# Patient Record
Sex: Female | Born: 1965 | Race: Black or African American | Hispanic: No | Marital: Married | Smoking: Former smoker
Health system: Southern US, Community
[De-identification: ages and names within clinical notes are randomized; demographics above are authoritative.]

## PROBLEM LIST (undated history)

## (undated) ENCOUNTER — Emergency Department (HOSPITAL_COMMUNITY): Admission: EM | Payer: BLUE CROSS/BLUE SHIELD | Source: Home / Self Care

## (undated) DIAGNOSIS — Z87442 Personal history of urinary calculi: Secondary | ICD-10-CM

## (undated) DIAGNOSIS — E785 Hyperlipidemia, unspecified: Secondary | ICD-10-CM

## (undated) DIAGNOSIS — J189 Pneumonia, unspecified organism: Secondary | ICD-10-CM

## (undated) DIAGNOSIS — T7840XA Allergy, unspecified, initial encounter: Secondary | ICD-10-CM

## (undated) DIAGNOSIS — J849 Interstitial pulmonary disease, unspecified: Secondary | ICD-10-CM

## (undated) DIAGNOSIS — J45909 Unspecified asthma, uncomplicated: Secondary | ICD-10-CM

## (undated) DIAGNOSIS — I5030 Unspecified diastolic (congestive) heart failure: Secondary | ICD-10-CM

## (undated) DIAGNOSIS — D649 Anemia, unspecified: Secondary | ICD-10-CM

## (undated) DIAGNOSIS — I499 Cardiac arrhythmia, unspecified: Secondary | ICD-10-CM

## (undated) DIAGNOSIS — F419 Anxiety disorder, unspecified: Secondary | ICD-10-CM

## (undated) DIAGNOSIS — F17210 Nicotine dependence, cigarettes, uncomplicated: Secondary | ICD-10-CM

## (undated) DIAGNOSIS — I050 Rheumatic mitral stenosis: Secondary | ICD-10-CM

## (undated) DIAGNOSIS — K219 Gastro-esophageal reflux disease without esophagitis: Secondary | ICD-10-CM

## (undated) DIAGNOSIS — I480 Paroxysmal atrial fibrillation: Secondary | ICD-10-CM

## (undated) DIAGNOSIS — R0789 Other chest pain: Secondary | ICD-10-CM

## (undated) DIAGNOSIS — I10 Essential (primary) hypertension: Secondary | ICD-10-CM

## (undated) DIAGNOSIS — J40 Bronchitis, not specified as acute or chronic: Secondary | ICD-10-CM

## (undated) DIAGNOSIS — I509 Heart failure, unspecified: Secondary | ICD-10-CM

## (undated) DIAGNOSIS — R0600 Dyspnea, unspecified: Secondary | ICD-10-CM

## (undated) DIAGNOSIS — I4819 Other persistent atrial fibrillation: Secondary | ICD-10-CM

## (undated) DIAGNOSIS — R011 Cardiac murmur, unspecified: Secondary | ICD-10-CM

## (undated) DIAGNOSIS — R002 Palpitations: Secondary | ICD-10-CM

## (undated) DIAGNOSIS — Z1371 Encounter for nonprocreative screening for genetic disease carrier status: Secondary | ICD-10-CM

## (undated) HISTORY — PX: JOINT REPLACEMENT: SHX530

## (undated) HISTORY — PX: CARDIAC CATHETERIZATION: SHX172

## (undated) HISTORY — DX: Other chest pain: R07.89

## (undated) HISTORY — DX: Other persistent atrial fibrillation: I48.19

## (undated) HISTORY — DX: Unspecified diastolic (congestive) heart failure: I50.30

## (undated) HISTORY — DX: Interstitial pulmonary disease, unspecified: J84.9

## (undated) HISTORY — DX: Paroxysmal atrial fibrillation: I48.0

## (undated) HISTORY — PX: ABDOMINAL HYSTERECTOMY: SUR658

## (undated) HISTORY — PX: TUBAL LIGATION: SHX77

## (undated) HISTORY — DX: Hyperlipidemia, unspecified: E78.5

## (undated) HISTORY — PX: DIAGNOSTIC LAPAROSCOPY: SUR761

## (undated) HISTORY — PX: KNEE SURGERY: SHX244

## (undated) HISTORY — DX: Allergy, unspecified, initial encounter: T78.40XA

## (undated) HISTORY — DX: Essential (primary) hypertension: I10

## (undated) HISTORY — DX: Rheumatic mitral stenosis: I05.0

## (undated) HISTORY — DX: Encounter for nonprocreative screening for genetic disease carrier status: Z13.71

## (undated) HISTORY — DX: Palpitations: R00.2

## (undated) HISTORY — PX: ABDOMINAL HYSTERECTOMY: SHX81

---

## 2004-09-14 ENCOUNTER — Inpatient Hospital Stay: Payer: Self-pay | Admitting: Obstetrics and Gynecology

## 2005-08-04 ENCOUNTER — Emergency Department: Payer: Self-pay | Admitting: Internal Medicine

## 2008-02-22 ENCOUNTER — Ambulatory Visit: Payer: Self-pay | Admitting: Internal Medicine

## 2008-03-07 ENCOUNTER — Ambulatory Visit: Payer: Self-pay | Admitting: Internal Medicine

## 2008-09-19 ENCOUNTER — Ambulatory Visit: Payer: Self-pay | Admitting: General Surgery

## 2009-03-12 ENCOUNTER — Ambulatory Visit: Payer: Self-pay | Admitting: Internal Medicine

## 2010-01-18 HISTORY — PX: BREAST BIOPSY: SHX20

## 2010-02-05 ENCOUNTER — Ambulatory Visit: Payer: Self-pay

## 2010-07-02 ENCOUNTER — Ambulatory Visit: Payer: Self-pay

## 2010-09-22 ENCOUNTER — Ambulatory Visit: Payer: Self-pay

## 2010-10-05 ENCOUNTER — Ambulatory Visit: Payer: Self-pay

## 2010-11-05 ENCOUNTER — Ambulatory Visit: Payer: Self-pay

## 2011-02-09 ENCOUNTER — Ambulatory Visit: Payer: Self-pay | Admitting: Internal Medicine

## 2011-02-18 ENCOUNTER — Ambulatory Visit: Payer: Self-pay | Admitting: Internal Medicine

## 2011-03-05 ENCOUNTER — Ambulatory Visit: Payer: Self-pay | Admitting: Internal Medicine

## 2011-03-16 ENCOUNTER — Ambulatory Visit: Payer: Self-pay

## 2011-06-01 ENCOUNTER — Ambulatory Visit: Payer: Self-pay

## 2011-06-14 ENCOUNTER — Ambulatory Visit: Payer: Self-pay

## 2011-06-28 ENCOUNTER — Ambulatory Visit: Payer: Self-pay | Admitting: Emergency Medicine

## 2011-06-29 LAB — PATHOLOGY REPORT

## 2011-08-25 ENCOUNTER — Ambulatory Visit: Payer: Self-pay | Admitting: Internal Medicine

## 2011-10-13 ENCOUNTER — Ambulatory Visit: Payer: Self-pay | Admitting: Physician Assistant

## 2011-12-30 ENCOUNTER — Ambulatory Visit: Payer: Self-pay | Admitting: Orthopedic Surgery

## 2012-02-22 ENCOUNTER — Ambulatory Visit: Payer: Self-pay

## 2012-03-28 ENCOUNTER — Ambulatory Visit: Payer: Self-pay | Admitting: Orthopedic Surgery

## 2012-04-05 ENCOUNTER — Ambulatory Visit: Payer: Self-pay | Admitting: Orthopedic Surgery

## 2012-04-05 LAB — URINALYSIS, COMPLETE
Bilirubin,UR: NEGATIVE
Blood: NEGATIVE
Glucose,UR: NEGATIVE mg/dL
Ketone: NEGATIVE
Leukocyte Esterase: NEGATIVE
Nitrite: NEGATIVE
Ph: 7
Protein: NEGATIVE
RBC,UR: 59 /HPF
Specific Gravity: 1.015
Squamous Epithelial: 4
WBC UR: 46 /HPF

## 2012-04-05 LAB — BASIC METABOLIC PANEL WITH GFR
Anion Gap: 5 — ABNORMAL LOW
BUN: 17 mg/dL
Calcium, Total: 9.3 mg/dL
Chloride: 103 mmol/L
Co2: 28 mmol/L
Creatinine: 0.89 mg/dL
EGFR (African American): >60
EGFR (Non-African Amer.): >60
Glucose: 98 mg/dL
Osmolality: 273
Potassium: 3.4 mmol/L — ABNORMAL LOW
Sodium: 136 mmol/L

## 2012-04-05 LAB — CBC
HCT: 41.6 %
HGB: 14 g/dL
MCH: 30.6 pg
MCHC: 33.5 g/dL
MCV: 91 fL
Platelet: 339 x10 3/mm 3
RBC: 4.56 X10 6/mm 3
RDW: 13.2 %
WBC: 7.2 x10 3/mm 3

## 2012-04-05 LAB — PROTIME-INR
INR: 0.9
Prothrombin Time: 12.6 s

## 2012-04-05 LAB — APTT: Activated PTT: 28.6 secs (ref 23.6–35.9)

## 2012-04-11 ENCOUNTER — Ambulatory Visit: Payer: Self-pay | Admitting: Orthopedic Surgery

## 2012-06-29 ENCOUNTER — Ambulatory Visit: Payer: Self-pay

## 2012-07-05 ENCOUNTER — Ambulatory Visit: Payer: Self-pay

## 2012-07-20 ENCOUNTER — Ambulatory Visit: Payer: Self-pay | Admitting: General Surgery

## 2012-07-20 ENCOUNTER — Other Ambulatory Visit: Payer: Self-pay | Admitting: *Deleted

## 2012-07-20 ENCOUNTER — Encounter: Payer: Self-pay | Admitting: General Surgery

## 2012-07-20 ENCOUNTER — Ambulatory Visit (INDEPENDENT_AMBULATORY_CARE_PROVIDER_SITE_OTHER): Payer: Managed Care, Other (non HMO) | Admitting: General Surgery

## 2012-07-20 VITALS — BP 120/78 | HR 74 | Resp 12 | Ht <= 58 in | Wt 232.0 lb

## 2012-07-20 DIAGNOSIS — Z803 Family history of malignant neoplasm of breast: Secondary | ICD-10-CM

## 2012-07-20 DIAGNOSIS — R920 Mammographic microcalcification found on diagnostic imaging of breast: Secondary | ICD-10-CM

## 2012-07-20 NOTE — Progress Notes (Signed)
Patient has been scheduled for a right breast stereotactic biopsy at [facility] for 08-07-12 at 1 pm. She will check-in at the [facility] at 12:30 pm. This patient is aware of date, time, and instructions. Patient verbalizes understanding.

## 2012-07-20 NOTE — Patient Instructions (Addendum)
Patient to be scheduled for a right breast Stereotactic Breast Biopsy.   Breast Biopsy, Stereotactic A stereotactic breast biopsy takes a tissue sample from the breast with a special instrument. This is done when:  The problem (lump, abnormality, mass) can be seen on X-ray, but not felt on physical exam.  Suspicious, small calcium deposits (calcifications) are seen in the breast.  There is a change in shape or appearance of the breast, thickening, or asymmetry on mammogram (breast X-ray).  You have nipple changes (unusual or bloody discharge, crusting, retraction, dimpling).  Your caregiver is making a surgical diagnosis. The biopsy may be done on a special table, with your face down and your breasts placed through openings in the table. Computerized imaging (special form of X-rays) is used. The images are not obtained using regular X-ray film. So, exposure to radiation is reduced. Images are seen through several different angles. The surgeon removes small pieces of the suspicious tissue through a hollow needle. The tissue will be sent to the lab for analysis. The surgeon can look at the pictures right away, rather than wait for an X-ray to be developed. Your caregiver can mark the lesions (abnormal tissue formations) electronically. Then the computer can tell exactly where the problem is, or if it has moved. BENEFITS OF THE PROCEDURE  This is a good way to see if tiny lumps, abnormal looking tissue, or calcium deposits that you cannot feel are cancerous or require further treatment or follow-up.  Needle biopsy is a simple procedure. It may be performed in an outpatient imaging center. This means you have the procedure and go home the same day, without checking into a hospital.  It is less painful than open surgery. The results are as accurate as when a tissue sample is removed surgically.  The procedure is faster, less expensive, less invasive, does not distort the breast, and leaves little  or no scar.  Breast defects, which can make future mammograms hard to read and interpret, do not remain.  Recovery time is brief. Patients can soon resume their normal activities.  Using VAD (vacuum assisted device) may make it possible to remove entire lesions.  A breast biopsy can indicate if you need surgery, other treatment, or combined treatment. LET YOUR CAREGIVER KNOW ABOUT:  Allergies.  Medications taken, including herbs, eye drops, over-the-counter medications, and creams.  Use of steroids (by mouth or creams).  Previous problems with anesthetics or numbing medication.  If you are taking blood thinner medications or aspirin.  Possibility of pregnancy, if this applies.  History of blood clots (thrombophlebitis).  History of bleeding or blood problems.  Previous surgery.  Other health problems. RISKS AND COMPLICATIONS  Infection (germ growing in the wound). This can often be treated with antibiotics.  Bleeding, following surgery. Your surgeon takes every precaution to keep this from happening.  There is some concern that if a cancerous mass is present, cancer cells might be spread by the needle. Whether this actually happens is not known. It does not appear to be a significant risk.  X-ray guided breast biopsy is not infallible (not always correct). The problem may be missed or the extent of the problem may be underestimated. This would mean the biopsy did not manage to remove a piece of the diseased tissue or enough of the diseased tissue.  Lesions present, with calcium deposits scattered throughout the breast, are difficult to target by stereotactic method. Those lesions near the chest wall also are hard to learn about by  this method. If the mammogram shows only a vague change in tissue density, but no definite mass or nodule, the X-ray guided method may not be successful. Occasionally, even after a successful biopsy, the tissue diagnosis remains uncertain. A surgical  biopsy will be needed, if abnormal or precancerous cells are found on core biopsy.  Altering or deforming of the breast.  Unable to find, or missing the lesion.  Rarely, the needle may go through the chest wall into the lung area. TWO BIOPSY INSTRUMENTS MAY BE USED IN THE PROCEDURE The conventional biopsy device (core needle biopsy device) consists of an inner needle with a trough extending from it at one end, and an overlying sheath. It is attached to a spring-loaded mechanism that propels it forward. The trough fills with tissue. The outer sheath instantly moves forward to cut the tissue and keep it in the trough. Each sample is obtained in a fraction of a second. It is necessary to withdraw the needle after each sample is taken to collect the tissue.  A newer type of instrument, the VAD (vacuum assisted device), uses vacuum pressure to pull breast tissue into a needle and remove it. The needle does not need to be withdrawn after each sampling. Another advantage is that biopsies are obtained in an orderly manner, by rotating the device. This helps make sure that the entire area of interest will be sampled. When using the automated core biopsy needle, sampling is more random.  FOR COMFORT DURING THE TEST  Relax as much as possible.  Try to follow instructions, to speed up the test.  Let your caregiver know if you are uncomfortable, anxious, or in pain. PROCEDURE  You are awake during the procedure, and you go home the same day (outpatient). A specially trained radiologist will do this procedure. First, the skin is cleansed. Then, it is injected with a local anesthetic. A small nick is made in the skin, and the tip of the biopsy needle is put into the calculated site of the lesion. A special mammography machine uses ionizing radiation to help guide the radiologist's instrument to the site of the abnormal growth. At this point, stereo images are again obtained, to confirm that the needle tip is at  the problem area. Usually 5 to 10 samples are collected when doing a core biopsy. At least 12 are collected when using the vacuum assisted device (VAD). Then, a final set of images is obtained. If they show that the lesion has been mostly or completely removed, a small clip is left at the biopsy site. This is so that it can be easily located, in case the lesion turns out to be cancer. Afterward, the skin opening is stitched (sutured) or taped closed, and covered with a dressing. Your caregiver may apply a pressure dressing and an ice pack, to prevent bleeding and swelling in the breast.  X-ray guided breast biopsy can take 30 minutes to 1 hour, or more. The X-rays usually have no side effects, and no radiation remains in your body. There is usually little or no pain. Usually no scar is left from the tiny skin incision. Many women find that the major discomfort of the procedure is from lying on their stomach, or staying in 1 position for the length of the procedure. This discomfort may be reduced by carefully placed cushions. You should wear a good support bra to the procedure. You will be asked to remove jewelry, dentures, eye glasses, metal objects, or clothing that might interfere  with the X-ray images. You may want to have someone with you, to take you home after the procedure. AFTER THE PROCEDURE   After surgery, if you are doing well and have no problems, you will be allowed to go home.  You may resume your regular diet, or as directed by your caregiver. HOME CARE INSTRUCTIONS   Follow your caregiver's recommendations for medications, care of the biopsy site, follow-up appointments, and further treatment.  Only take over-the-counter or prescription medicines for pain, discomfort, or fever as directed by your caregiver.  An ice pack applied to the affected area may help with discomfort and keep the swelling down.  Change dressings as directed.  Wear a good support bra for as long as your  caregiver recommends.  Avoid strenuous activity for at least 24 hours, or as advised by your caregiver. Finding out the results of your test Not all test results are available during your visit. If your test results are not back during the visit, make an appointment with your caregiver to find out the results. Do not assume everything is normal if you have not heard from your caregiver or the medical facility. It is important for you to follow up on all of your test results.  SEEK MEDICAL CARE IF:   You develop a rash.  You have problems with your medicines.  You become lightheaded or dizzy. SEEK IMMEDIATE MEDICAL CARE IF:   There is increased bleeding (more than a small spot) from the biopsy site.  You notice redness, swelling, or increasing pain in the wound.  Pus is coming from the wound.  You have a fever.  You notice a bad smell coming from the wound or dressing.  You develop shortness of breath.  You develop chest pain.  You pass out. Document Released: 10/03/2002 Document Revised: 03/29/2011 Document Reviewed: 11/08/2008 [facility] Patient Information 2014 [place], [place].   Pt is a candidate for genetic testing. Discussed with her and she is agreeable.

## 2012-07-20 NOTE — Progress Notes (Signed)
Patient ID: Leslie [name], female   DOB: 09-14-1965, 47 y.o.   MRN: [id number]  Chief Complaint  Patient presents with  . Breast Problem    abnormal mammogram    HPI Leslie [name] is a 47 y.o. female who presents for a breast evaluation. The most recent mammogram was done on 06/29/12 with a birad category 0. Additional views of the right breast were done on 07/05/12 with a birad category 4.  Patient does perform regular self breast checks and gets regular mammograms done.  No breast problems at this time.   HPI  Past Medical History  Diagnosis Date  . Hypertension   . Hyperlipidemia   . Allergy     Past Surgical History  Procedure Laterality Date  . Abdominal hysterectomy    . Knee surgery    . Breast biopsy Bilateral 2012    Family History  Problem Relation Age of Onset  . Cancer Mother 39    breast  . Cancer Maternal Aunt     breast  . Cancer Maternal Grandmother     breast    Social History History  Substance Use Topics  . Smoking status: Current Every Day Smoker    Types: Cigarettes  . Smokeless tobacco: Not on file  . Alcohol Use: No    Allergies  Allergen Reactions  . Morphine And Related Hives    Current Outpatient Prescriptions  Medication Sig Dispense Refill  . atorvastatin (LIPITOR) 20 MG tablet Take 10 mg by mouth daily.       . Budesonide-Formoterol Fumarate (SYMBICORT IN) Inhale into the lungs.      Marland Kitchen estradiol (ESTRACE) 1 MG tablet Take 1 mg by mouth daily.       . hydrochlorothiazide (HYDRODIURIL) 25 MG tablet Take 10 mg by mouth daily.       . meloxicam (MOBIC) 15 MG tablet Take by mouth daily.       . Vitamin D, Ergocalciferol, (DRISDOL) 50000 UNITS CAPS        No current facility-administered medications for this visit.    Review of Systems Review of Systems  Constitutional: Negative.   Respiratory: Negative.   Cardiovascular: Negative.     Blood pressure 120/78, pulse 74, resp. rate 12, height 4\' 6"  (1.372 m), weight 232 lb (105.235  kg).  Physical Exam Physical Exam  Constitutional: She is oriented to person, place, and time. She appears well-developed and well-nourished.  Neck: No thyromegaly present.  Cardiovascular: Normal rate, regular rhythm and normal heart sounds.   No murmur heard. Pulmonary/Chest: Effort normal and breath sounds normal. Right breast exhibits no inverted nipple, no mass, no nipple discharge, no skin change and no tenderness. Left breast exhibits inverted nipple. Left breast exhibits no mass, no nipple discharge, no skin change and no tenderness.  Left nipple retracted with a transverse slit.   Lymphadenopathy:    She has no cervical adenopathy.    She has no axillary adenopathy.  Neurological: She is alert and oriented to person, place, and time.  Skin: Skin is warm and dry.    Data Reviewed Mammogram shows a cluster of calcification upper outer quadrant of right breast. Below the level of prior clip.   Assessment    Mammogram with microcalcification's. Pt high risk due to FH. Prior needle biopsy reports are not available at present. Will try to obtain these.    Plan    Stereotactic breast biopsy of right breast.        Earlean Fidalgo G 07/20/2012, 1:05  PM

## 2012-07-25 ENCOUNTER — Telehealth: Payer: Self-pay | Admitting: *Deleted

## 2012-07-25 NOTE — Telephone Encounter (Signed)
Message copied by Currie Paris on Tue Jul 25, 2012  4:53 PM ------      Message from: Nicholes Mango      Created: Thu Jul 20, 2012 12:16 PM       Please call pt re: BRCA testing per [facility]. She was here this morning. Thanks.  ------

## 2012-07-25 NOTE — Telephone Encounter (Signed)
Notified patient as instructed. She wishes to hold off on genetic testing at this time (financial issues).  She will let us know when she is able to proceed.

## 2012-08-07 ENCOUNTER — Ambulatory Visit: Payer: Self-pay | Admitting: General Surgery

## 2012-08-07 DIAGNOSIS — R920 Mammographic microcalcification found on diagnostic imaging of breast: Secondary | ICD-10-CM

## 2012-08-07 HISTORY — PX: BREAST BIOPSY: SHX20

## 2012-08-08 LAB — PATHOLOGY REPORT

## 2012-08-09 ENCOUNTER — Telehealth: Payer: Self-pay | Admitting: *Deleted

## 2012-08-09 ENCOUNTER — Encounter: Payer: Self-pay | Admitting: General Surgery

## 2012-08-09 NOTE — Telephone Encounter (Signed)
Notified patient as instructed, benign stereo breast biopsy per Dr Evette Cristal, patient pleased. Discussed follow-up appointments next week with nurse and 6 months with MD, patient agrees

## 2012-08-17 ENCOUNTER — Ambulatory Visit: Payer: Managed Care, Other (non HMO)

## 2013-02-13 ENCOUNTER — Ambulatory Visit: Payer: Managed Care, Other (non HMO) | Admitting: General Surgery

## 2013-02-20 ENCOUNTER — Ambulatory Visit: Payer: Self-pay | Admitting: General Surgery

## 2013-02-22 ENCOUNTER — Encounter: Payer: Self-pay | Admitting: General Surgery

## 2013-02-27 ENCOUNTER — Ambulatory Visit: Payer: Self-pay | Admitting: General Surgery

## 2013-03-05 ENCOUNTER — Ambulatory Visit: Payer: Self-pay | Admitting: General Surgery

## 2013-03-12 ENCOUNTER — Ambulatory Visit: Payer: Self-pay | Admitting: General Surgery

## 2013-03-22 ENCOUNTER — Ambulatory Visit (INDEPENDENT_AMBULATORY_CARE_PROVIDER_SITE_OTHER): Payer: Managed Care, Other (non HMO) | Admitting: General Surgery

## 2013-03-22 ENCOUNTER — Encounter: Payer: Self-pay | Admitting: General Surgery

## 2013-03-22 VITALS — BP 124/70 | Ht 64.0 in | Wt 234.0 lb

## 2013-03-22 DIAGNOSIS — N6019 Diffuse cystic mastopathy of unspecified breast: Secondary | ICD-10-CM

## 2013-03-22 DIAGNOSIS — Z1371 Encounter for nonprocreative screening for genetic disease carrier status: Secondary | ICD-10-CM

## 2013-03-22 DIAGNOSIS — Z803 Family history of malignant neoplasm of breast: Secondary | ICD-10-CM

## 2013-03-22 HISTORY — DX: Encounter for nonprocreative screening for genetic disease carrier status: Z13.71

## 2013-03-22 NOTE — Patient Instructions (Signed)
Continue self breast exams. Call office for any new breast issues or concerns. 

## 2013-03-22 NOTE — Progress Notes (Signed)
Patient ID: Leslie [name], female   DOB: 01-Feb-1965, 48 y.o.   MRN: [id number]  Chief Complaint  Patient presents with  . Follow-up    mammogram done 02-20-13    HPI Leslie [name] is a 48 y.o. female.  Here today for follow up right breast mammogram done 02-20-13. She had a right breast biopsy done 08-07-12. HPI  Past Medical History  Diagnosis Date  . Hypertension   . Hyperlipidemia   . Allergy     Past Surgical History  Procedure Laterality Date  . Abdominal hysterectomy    . Knee surgery    . Breast biopsy Bilateral 2012  . Breast biopsy Right 08-07-12    fibroadenomatous changes and columnar cells    Family History  Problem Relation Age of Onset  . Cancer Mother 27    breast  . Cancer Maternal Aunt     breast  . Cancer Maternal Grandmother     breast    Social History History  Substance Use Topics  . Smoking status: Current Every Day Smoker    Types: Cigarettes  . Smokeless tobacco: Not on file  . Alcohol Use: No    Allergies  Allergen Reactions  . Morphine And Related Hives    Current Outpatient Prescriptions  Medication Sig Dispense Refill  . atorvastatin (LIPITOR) 20 MG tablet Take 10 mg by mouth daily.       . chlorpheniramine-HYDROcodone (TUSSIONEX) 10-8 MG/5ML LQCR every 12 (twelve) hours as needed.       Marland Kitchen estradiol (ESTRACE) 1 MG tablet Take 1 mg by mouth daily.       . hydrochlorothiazide (HYDRODIURIL) 25 MG tablet Take 10 mg by mouth daily.       . meloxicam (MOBIC) 15 MG tablet Take by mouth daily.       . Vitamin D, Ergocalciferol, (DRISDOL) 50000 UNITS CAPS        No current facility-administered medications for this visit.    Review of Systems Review of Systems  Constitutional: Negative.   Respiratory: Negative.   Cardiovascular: Negative.     Blood pressure 124/70, height 5\' 4"  (1.626 m), weight 234 lb (106.142 kg).  Physical Exam Physical Exam  Constitutional: She appears well-developed and well-nourished.  Neck: Neck supple.   Pulmonary/Chest: Right breast exhibits no inverted nipple, no mass, no nipple discharge, no skin change and no tenderness. Left breast exhibits inverted nipple. Left breast exhibits no mass, no nipple discharge, no skin change and no tenderness.  Left nipple transverse slit is chronic  Lymphadenopathy:    She has no cervical adenopathy.    She has no axillary adenopathy.  Skin: Skin is warm and dry.    Data Reviewed Right breast mammogram reviewed and stable.  Assessment    Stable. Strong FH of breast cancer. Candidate for genetic testing.    Plan    Follow up with bilateral screening mammogram in 5 months and office visit. Discussed genetic testing and she is agreeable.        SANKAR,SEEPLAPUTHUR G 03/23/2013, 6:43 AM

## 2013-03-23 ENCOUNTER — Encounter: Payer: Self-pay | Admitting: General Surgery

## 2013-03-23 DIAGNOSIS — N6019 Diffuse cystic mastopathy of unspecified breast: Secondary | ICD-10-CM | POA: Insufficient documentation

## 2013-04-19 LAB — COMPREHENSIVE BRCA1/2 ANALYSIS

## 2013-04-19 LAB — BRCASSURE COMPREHENSIVE TEST

## 2013-08-30 ENCOUNTER — Ambulatory Visit: Payer: Managed Care, Other (non HMO) | Admitting: General Surgery

## 2013-10-18 ENCOUNTER — Encounter: Payer: Self-pay | Admitting: *Deleted

## 2013-11-19 ENCOUNTER — Encounter: Payer: Self-pay | Admitting: General Surgery

## 2014-02-19 ENCOUNTER — Encounter: Payer: Self-pay | Admitting: General Surgery

## 2014-02-20 ENCOUNTER — Telehealth: Payer: Self-pay | Admitting: *Deleted

## 2014-02-20 NOTE — Telephone Encounter (Signed)
Contacted to verify patient was aware of her appointment. Patient aware of appointment 02/21/14.

## 2014-02-21 ENCOUNTER — Ambulatory Visit: Payer: Managed Care, Other (non HMO) | Admitting: General Surgery

## 2014-03-21 ENCOUNTER — Ambulatory Visit (INDEPENDENT_AMBULATORY_CARE_PROVIDER_SITE_OTHER): Payer: Managed Care, Other (non HMO) | Admitting: General Surgery

## 2014-03-21 ENCOUNTER — Encounter: Payer: Self-pay | Admitting: General Surgery

## 2014-03-21 VITALS — BP 146/88 | HR 90 | Resp 16 | Ht 64.0 in | Wt 240.0 lb

## 2014-03-21 DIAGNOSIS — Z803 Family history of malignant neoplasm of breast: Secondary | ICD-10-CM

## 2014-03-21 DIAGNOSIS — N6019 Diffuse cystic mastopathy of unspecified breast: Secondary | ICD-10-CM | POA: Diagnosis not present

## 2014-03-21 NOTE — Progress Notes (Signed)
Patient ID: Leslie [name], female   DOB: April 29, 1965, 49 y.o.   MRN: [id number]  Chief Complaint  Patient presents with  . Follow-up    HPI Leslie [name] is a 49 y.o. female.  who presents for her follow up breast evaluation. The most recent mammogram was done on 02-12-14.  Patient does perform regular self breast checks and gets regular mammograms done.  No new breast issues.  HPI  Past Medical History  Diagnosis Date  . Hypertension   . Hyperlipidemia   . Allergy     Past Surgical History  Procedure Laterality Date  . Abdominal hysterectomy    . Knee surgery    . Breast biopsy Bilateral 2012  . Breast biopsy Right 08-07-12    fibroadenomatous changes and columnar cells    Family History  Problem Relation Age of Onset  . Cancer Mother 25    breast  . Cancer Maternal Aunt     breast  . Cancer Maternal Grandmother     breast    Social History History  Substance Use Topics  . Smoking status: Current Every Day Smoker    Types: Cigarettes  . Smokeless tobacco: Not on file  . Alcohol Use: No    Allergies  Allergen Reactions  . Morphine And Related Hives    Current Outpatient Prescriptions  Medication Sig Dispense Refill  . atorvastatin (LIPITOR) 20 MG tablet Take 10 mg by mouth daily.     Marland Kitchen estradiol (ESTRACE) 1 MG tablet Take 1 mg by mouth daily.     . hydrochlorothiazide (HYDRODIURIL) 25 MG tablet Take 10 mg by mouth daily.     Marland Kitchen ibuprofen (ADVIL,MOTRIN) 800 MG tablet Take 800 mg by mouth every 8 (eight) hours as needed.     . Multiple Vitamin (MULTIVITAMIN) capsule Take 1 capsule by mouth daily.    . Omega-3 Fatty Acids (FISH OIL) 1000 MG CAPS Take by mouth daily.     No current facility-administered medications for this visit.    Review of Systems Review of Systems  Constitutional: Negative.   Respiratory: Negative.   Cardiovascular: Negative.     Blood pressure 146/88, pulse 90, resp. rate 16, height 5\' 4"  (1.626 m), weight 240 lb (108.863  kg).  Physical Exam Physical Exam  Constitutional: She is oriented to person, place, and time. She appears well-developed and well-nourished.  Eyes: Conjunctivae are normal. No scleral icterus.  Neck: Neck supple.  Cardiovascular: Normal rate, regular rhythm and normal heart sounds.   Pulmonary/Chest: Effort normal and breath sounds normal. Right breast exhibits no inverted nipple, no mass, no nipple discharge, no skin change and no tenderness. Left breast exhibits no inverted nipple, no mass, no nipple discharge, no skin change and no tenderness.  Bilateral breast with transverse slit and easily inverted unchanged from previous exam.  Abdominal: Soft. Normal appearance. There is no hepatomegaly. There is no tenderness.  Lymphadenopathy:    She has no cervical adenopathy.    She has no axillary adenopathy.  Neurological: She is alert and oriented to person, place, and time.  Skin: Skin is warm and dry.    Data Reviewed Mammogram reviewed and stable.  Assessment    Stable physical exam. FCD and FH of breast CA    Plan    Patient to return in one year with bilateral screening mammogram. Pt is on Estrace -have asked she discuss this with her PCP given her high risk for breast  CA  Benedetto Ryder G 03/21/2014, 4:22 PM

## 2014-03-21 NOTE — Patient Instructions (Addendum)
Continue self breast exams. Call office for any new breast issues or concerns. Patient to return in one year with bilateral screening mammogram.

## 2014-05-03 ENCOUNTER — Ambulatory Visit
Admit: 2014-05-03 | Disposition: A | Discharge status: Home / Self Care | Payer: Self-pay | Attending: Nurse Practitioner | Admitting: Nurse Practitioner

## 2014-05-08 ENCOUNTER — Ambulatory Visit
Admit: 2014-05-08 | Disposition: A | Discharge status: Home / Self Care | Payer: Self-pay | Attending: Urology | Admitting: Urology

## 2014-05-08 LAB — BASIC METABOLIC PANEL
Anion Gap: 10 (ref 7–16)
BUN: 14 mg/dL
Calcium, Total: 9.2 mg/dL
Chloride: 99 mmol/L — ABNORMAL LOW
Co2: 29 mmol/L
Creatinine: 1.08 mg/dL — ABNORMAL HIGH
EGFR (African American): >60
EGFR (Non-African Amer.): >60
Glucose: 100 mg/dL — ABNORMAL HIGH
Potassium: 3.4 mmol/L — ABNORMAL LOW
Sodium: 138 mmol/L

## 2014-05-09 ENCOUNTER — Ambulatory Visit
Admit: 2014-05-09 | Disposition: A | Discharge status: Home / Self Care | Payer: Self-pay | Attending: Urology | Admitting: Urology

## 2014-05-10 NOTE — Op Note (Signed)
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   SURGERY:   1.  Left knee arthroscopic medial partial meniscectomy.  2.  Debridement of medial and suprapatellar plica.  3.  Chondroplasty of the medial femoral condyle and undersurface of the patella.  4.  Microfracture of the medial femoral condyle.   SURGEON:  Thornton Park, MD   ANESTHESIA:  General.   COMPLICATIONS:  None.   ESTIMATED BLOOD LOSS:  Minimal.   INDICATIONS FOR PROCEDURE:  The patient is a 49 year old female who has had persistent left knee pain following a twisting injury in December 2013. The patient has failed conservative management. An MRI has demonstrated chondromalacia of the medial femoral condyle and patellofemoral joint as well as a linear signal consistent with a tear of the posterior horn of the medial meniscus. The patient did have mechanical symptoms consistent with a meniscal tear. It was felt that the patient would benefit from arthroscopic partial medial meniscectomy despite having chondroplasty of the medial femoral condyle and patella.   The risks and benefits of surgery were reviewed with the patient in the office prior to the day of surgery. She understands the risks including infection, bleeding, nerve or blood vessel injury, knee stiffness, persistent pain, osteoarthritis, retear of the meniscus and the need for further surgery including total knee replacement. Medical complications include, but are not limited to DVT and pulmonary embolism, myocardial infarction, stroke, pneumonia,  respiratory failure and death. The patient understood these risks and wished to proceed.   PROCEDURE NOTE:  The patient was met in the preoperative area. I signed the left knee with the word "yes" according to the hospital's right site protocol within the operative field. This was after verbally confirming with the patient that this was the correct site of surgery. This was also confirmed using my office notes and radiographic studies. I updated the patient's history and physical. She was brought to the Operating Room where she was placed supine on the operative table. She underwent general endotracheal intubation. The patient was prepped and draped in a sterile fashion.   A timeout was performed to verify the patient's name, date of birth, medical record number, correct site of surgery and correct procedure to be performed. It was also used to verify the patient had received antibiotics and that all appropriate instruments, implants and radiographic studies were available in the room. Once all in attendance were in agreement, the case began.   Proposed incisions were drawn out with a surgical marker based upon bony landmarks. These were preinjected with 1% lidocaine plain. The 11 blade was used to establish an inferolateral and inferomedial portals. The arthroscope was placed through the inferolateral portal and brought into the patellofemoral joint as the knee was carefully brought into extension. A full diagnostic examination of the knee was performed including the suprapatellar pouch, the medial and lateral gutters, the medial and lateral compartment and the intercondylar notch.   Findings on arthroscopy included diffuse chondromalacia of the undersurface of the patella as well as a medial femoral condyle and corresponding medial tibial plateau. Findings also included a tear of the posterior horn of  the medial meniscus. The ACL was intact. The patient had significant hypertrophy of the Hoffa's fat pad in  the anterior knee. The patient had also a full-thickness 8 mm in diameter grade IV chondral lesion of the medial femoral condyle which involved the anterior portion of the femoral condyle which included a portion of the weightbearing surface.   Following the diagnostic arthroscopy, a 4-0 resector shaver blade was used to debride Hoffa's fat pad for better visualization. A medial plica was also debrided using a 90-degree ArthroCare wand and 4-0 resector shaver blade.   The attention was then turned to the medial compartment. A 3.5 mm resector shaver blade was placed through the inferomedial portal. A chondroplasty of the medial femoral condyle was performed. This allowed for better visualization of the posterior horn of the medial meniscus. This was probed with a hook probe and a significant radial tear was seen at the junction between the body and posterior horn. This was debrided using a straight basket and a 3.5 resector shaver blade. It was contoured to avoid any sharp edges which may lead to a later tear. The patient's left knee was then positioned in a figure-of-4 position to view the lateral compartment. There were no meniscal tear or chondral injuries seen.   Finally, the knee was brought back into full extension and a chondroplasty of the undersurface of the patella was performed with a 3.5 resector shaver blade. A 90-degree ArthroCare wand was used to release the suprapatellar plica. The knee joint was copiously irrigated and lavaged to remove all chondral and meniscal fragments.   Finally, the attention was turned to the full-thickness lesion of the medial femoral condyle. This was debrided again using 3.5 resector shaver blade and a ring curet. This allowed for creation of a well-circumscribed focal chondral lesion. Microfracture awls were used to perform a microfracture to allow for punctate bleeding of the medial femoral condyle. Inflow to the knee was then shut off and it was visualized that  the microfracture site provided excellent bleeding. Arthroscopic instruments were then removed and 4-0 nylons were then used to close the 2 arthroscopy incisions. Steri-Strips were applied along with a sterile bandage. Then 30 mL of 0.25% Marcaine with epinephrine was injected intraarticularly for postoperative pain control. A dry sterile dressing was applied. The patient was then transferred to a hospital stretcher and brought to the PACU in stable condition after extubation. I was scrubbed and present for the entire case and all sharp and instrument counts were correct at the conclusion of the case. I spoke with the patient's husband and daughter in the postoperative consultation room to let them know the case had gone without complication and the patient was stable in the recovery room.    ____________________________ Kevin L. Krasinski, MD klk:si D: 04/11/2012 18:03:07 ET T: 04/11/2012 21:53:09 ET JOB#: [id number]  cc: Kevin L. Krasinski, MD, <Dictator> KEVIN L KRASINSKI MD ELECTRONICALLY SIGNED 04/13/2012 13:24 

## 2014-11-04 ENCOUNTER — Ambulatory Visit: Payer: Managed Care, Other (non HMO)

## 2014-11-04 ENCOUNTER — Ambulatory Visit (INDEPENDENT_AMBULATORY_CARE_PROVIDER_SITE_OTHER): Payer: Managed Care, Other (non HMO) | Admitting: Podiatry

## 2014-11-04 ENCOUNTER — Encounter: Payer: Self-pay | Admitting: Podiatry

## 2014-11-04 VITALS — BP 150/84 | HR 100 | Resp 12

## 2014-11-04 DIAGNOSIS — Q828 Other specified congenital malformations of skin: Secondary | ICD-10-CM

## 2014-11-04 DIAGNOSIS — B070 Plantar wart: Secondary | ICD-10-CM | POA: Diagnosis not present

## 2014-11-04 DIAGNOSIS — M722 Plantar fascial fibromatosis: Secondary | ICD-10-CM | POA: Diagnosis not present

## 2014-11-04 NOTE — Progress Notes (Signed)
   Subjective:    Patient ID: Leslie [name], female    DOB: 02-07-1965, 49 y.o.   MRN: [id number]  HPI: She presented today with chief complaint of a painful lesion plantar aspect of her left heel. States that it seems to be getting worse and getting vigorous time goes on she's tried working on it herself to no avail She states that it has become more painful to the point where she cannot even walk on it.    Review of Systems  Musculoskeletal: Positive for gait problem.       Objective:   Physical Exam: 49 year old female presents today in no apparent distress vital signs stable alert and oriented 3. Pulses are palpable left foot. Neurologic sensorium is intact. Deep tendon reflexes are brisk and equal bilateral. Muscle strength is full and strong bilateral. Orthopedic evaluation and x-rays all joints sensitive ankle full range of motion without crepitation. Cutaneous evaluationof well-hydrated cutis solitary porokeratotic plantar aspect of her left heel centrally located. No erythema edema saline as drainage or odor. Mild reactive hyperpigmentation. I debrided this today without bleeding.        Assessment & Plan:  Solitary porokeratotic plantar aspect left foot.  Plan: Discussed etiology pathology conservative versus surgical therapies. Debrided this today sharply and then applying salicylic acid to be left on for 3 days without getting it wet under occlusion she understands this is amenable to it and will follow-up with me in 6 weeks for repeat debridement.  Roselind Messier DPM

## 2014-12-11 ENCOUNTER — Ambulatory Visit
Admission: RE | Admit: 2014-12-11 | Discharge: 2014-12-11 | Disposition: A | Discharge status: Home / Self Care | Payer: Managed Care, Other (non HMO) | Source: Ambulatory Visit | Attending: Orthopedic Surgery | Admitting: Orthopedic Surgery

## 2014-12-11 ENCOUNTER — Encounter
Admission: RE | Admit: 2014-12-11 | Discharge: 2014-12-11 | Disposition: A | Discharge status: Home / Self Care | Payer: Managed Care, Other (non HMO) | Source: Ambulatory Visit | Attending: Orthopedic Surgery | Admitting: Orthopedic Surgery

## 2014-12-11 DIAGNOSIS — J9811 Atelectasis: Secondary | ICD-10-CM | POA: Diagnosis not present

## 2014-12-11 DIAGNOSIS — Z01812 Encounter for preprocedural laboratory examination: Secondary | ICD-10-CM | POA: Insufficient documentation

## 2014-12-11 DIAGNOSIS — F17200 Nicotine dependence, unspecified, uncomplicated: Secondary | ICD-10-CM

## 2014-12-11 DIAGNOSIS — Z01811 Encounter for preprocedural respiratory examination: Secondary | ICD-10-CM | POA: Insufficient documentation

## 2014-12-11 DIAGNOSIS — Z01810 Encounter for preprocedural cardiovascular examination: Secondary | ICD-10-CM | POA: Diagnosis present

## 2014-12-11 DIAGNOSIS — I10 Essential (primary) hypertension: Secondary | ICD-10-CM | POA: Diagnosis not present

## 2014-12-11 HISTORY — DX: Anxiety disorder, unspecified: F41.9

## 2014-12-11 HISTORY — DX: Gastro-esophageal reflux disease without esophagitis: K21.9

## 2014-12-11 LAB — BASIC METABOLIC PANEL
Anion gap: 9 (ref 5–15)
BUN: 15 mg/dL (ref 6–20)
CO2: 27 mmol/L (ref 22–32)
Calcium: 9.6 mg/dL (ref 8.9–10.3)
Chloride: 103 mmol/L (ref 101–111)
Creatinine, Ser: 0.96 mg/dL (ref 0.44–1.00)
GFR calc Af Amer: >60 mL/min (ref >60)
GFR calc non Af Amer: >60 mL/min (ref >60)
Glucose, Bld: 81 mg/dL (ref 65–99)
Potassium: 3.5 mmol/L (ref 3.5–5.1)
Sodium: 139 mmol/L (ref 135–145)

## 2014-12-11 LAB — SURGICAL PCR SCREEN
MRSA, PCR: NEGATIVE
Staphylococcus aureus: NEGATIVE

## 2014-12-11 LAB — URINALYSIS COMPLETE WITH MICROSCOPIC (ARMC ONLY)
Bacteria, UA: NONE SEEN
Bilirubin Urine: NEGATIVE
Glucose, UA: NEGATIVE mg/dL
Hgb urine dipstick: NEGATIVE
Ketones, ur: NEGATIVE mg/dL
Leukocytes, UA: NEGATIVE
Nitrite: NEGATIVE
Protein, ur: NEGATIVE mg/dL
Specific Gravity, Urine: 1.006 (ref 1.005–1.030)
Squamous Epithelial / LPF: NONE SEEN
pH: 6 (ref 5.0–8.0)

## 2014-12-11 LAB — CBC
HCT: 39.3 % (ref 35.0–47.0)
Hemoglobin: 13.3 g/dL (ref 12.0–16.0)
MCH: 30.5 pg (ref 26.0–34.0)
MCHC: 33.9 g/dL (ref 32.0–36.0)
MCV: 90 fL (ref 80.0–100.0)
Platelets: 302 10*3/uL (ref 150–440)
RBC: 4.36 MIL/uL (ref 3.80–5.20)
RDW: 13.2 % (ref 11.5–14.5)
WBC: 5.1 10*3/uL (ref 3.6–11.0)

## 2014-12-11 LAB — PROTIME-INR
INR: 1.03
Prothrombin Time: 13.7 seconds (ref 11.4–15.0)

## 2014-12-11 LAB — APTT: aPTT: 27 seconds (ref 24–36)

## 2014-12-11 LAB — ABO/RH: ABO/RH(D): A POS

## 2014-12-11 NOTE — Patient Instructions (Signed)
  Your procedure is scheduled on: Wednesday 12/25/2014 Report to Day Surgery. 2ND FLOOR MEDICAL MALL ENTRANCE To find out your arrival time please call [phone] between 1PM - 3PM on Tuesday 12/24/2014.  Remember: Instructions that are not followed completely may result in serious medical risk, up to and including death, or upon the discretion of your surgeon and anesthesiologist your surgery may need to be rescheduled.    __X__ 1. Do not eat food or drink liquids after midnight. No gum chewing or hard candies.     __X__ 2. No Alcohol for 24 hours before or after surgery.   ____ 3. Bring all medications with you on the day of surgery if instructed.    __X_ 4. Notify your doctor if there is any change in your medical condition     (cold, fever, infections).     Do not wear jewelry, make-up, hairpins, clips or nail polish.  Do not wear lotions, powders, or perfumes.   Do not shave 48 hours prior to surgery. Men may shave face and neck.  Do not bring valuables to the hospital.    [facility] is not responsible for any belongings or valuables.               Contacts, dentures or bridgework may not be worn into surgery.  Leave your suitcase in the car. After surgery it may be brought to your room.  For patients admitted to the hospital, discharge time is determined by your                treatment team.   Patients discharged the day of surgery will not be allowed to drive home.   Please read over the following fact sheets that you were given:   MRSA Information and Surgical Site Infection Prevention   __X__ Take these medicines the morning of surgery with A SIP OF WATER:    1. PREVACID  2. MAY TAKE ALPRAZOLAM IF NEEDED  3.   4.  5.  6.  ____ Fleet Enema (as directed)   __X_ Use CHG Soap as directed  __X__ Use inhalers on the day of surgery  ____ Stop metformin 2 days prior to surgery    ____ Take 1/2 of usual insulin dose the night before surgery and none on the morning  of surgery.   ____ Stop Coumadin/Plavix/aspirin on   __X__ Stop Anti-inflammatories on    STOP IBUPROFEN 7 DAYS BEFORE SURGERY   __X__ Stop supplements until after surgery.  STOP FISH OIL 7 DAYS BEFORE SURGERY  ____ Bring C-Pap to the hospital.

## 2014-12-16 NOTE — OR Nursing (Signed)
Cleared by pcp 11/24/14 prior to cxr. Abnormal CXR faxed and called to Dr Mack Guise office at Dr Andree Elk request. Damaris Schooner to [facility]

## 2014-12-18 ENCOUNTER — Other Ambulatory Visit: Payer: Self-pay | Admitting: Nurse Practitioner

## 2014-12-18 ENCOUNTER — Ambulatory Visit: Payer: Managed Care, Other (non HMO) | Admitting: Podiatry

## 2014-12-18 DIAGNOSIS — Z01810 Encounter for preprocedural cardiovascular examination: Secondary | ICD-10-CM

## 2014-12-19 ENCOUNTER — Encounter: Payer: Self-pay | Admitting: General Surgery

## 2014-12-19 ENCOUNTER — Ambulatory Visit (INDEPENDENT_AMBULATORY_CARE_PROVIDER_SITE_OTHER): Payer: Managed Care, Other (non HMO) | Admitting: General Surgery

## 2014-12-19 VITALS — BP 130/74 | HR 88 | Resp 16 | Ht 64.0 in | Wt 231.0 lb

## 2014-12-19 DIAGNOSIS — Z803 Family history of malignant neoplasm of breast: Secondary | ICD-10-CM

## 2014-12-19 DIAGNOSIS — R920 Mammographic microcalcification found on diagnostic imaging of breast: Secondary | ICD-10-CM | POA: Diagnosis not present

## 2014-12-19 DIAGNOSIS — N6019 Diffuse cystic mastopathy of unspecified breast: Secondary | ICD-10-CM

## 2014-12-19 NOTE — Patient Instructions (Addendum)
The patient is aware to call back for any questions or concerns. Stereotactic Breast Biopsy A stereotactic breast biopsy is a procedure in which mammography is used in the collection of a sample of breast tissue. Mammography is a type of X-ray exam of the breasts that produces an image called a mammogram. The mammogram allows your health care provider to precisely locate the area of the breast from which a tissue sample will be taken. The tissue is then examined under a microscope to see if cancerous cells are present. A breast biopsy is done when:   A lump, abnormality, or mass is seen in the breast on a breast X-ray (mammogram).   Small calcium deposits (calcifications) are seen in the breast.   The shape or appearance of the breasts changes.   The shape or appearance of the nipples changes. You may have unusual or bloody discharge coming from the nipples, or you may have crusting, retraction, or dimpling of the nipples. A breast biopsy can indicate if you need surgery or other treatment.  LET [facility] CARE PROVIDER KNOW ABOUT:  Any allergies you have.  All medicines you are taking, including vitamins, herbs, eye drops, creams, and over-the-counter medicines.  Previous problems you or members of your family have had with the use of anesthetics.  Any blood disorders you have.  Previous surgeries you have had.  Medical conditions you have. RISKS AND COMPLICATIONS Generally, stereotactic breast biopsy is a safe procedure. However, as with any procedure, complications can occur. Possible complications include:  Infection at the needle-insertion site.   Bleeding or bruising after surgery.  The breast may become altered or deformed as a result of the procedure.  The needle may go through the chest wall into the lung area.  BEFORE THE PROCEDURE  Wear a supportive bra to the procedure.  You will be asked to remove jewelry, dentures, eyeglasses, metal objects, or clothing  that might interfere with the X-ray images. You may want to leave some of these objects at home.  Arrange for someone to drive you home after the procedure if desired. PROCEDURE  A stereotactic breast biopsy is done while you are awake. During the procedure, relax as much as possible. Let your health care provider know if you are uncomfortable, anxious, or in pain. Usually, the only discomfort felt during the procedure is caused by staying in one position for the length of the procedure. This discomfort can be reduced by carefully placed cushions. Most of the time the biopsy is done using a table with openings on it. You will be asked to lie facedown on the table and place your breasts through the openings. Your breast is compressed between metal plates to get good X-ray images. Your skin will be cleaned, and a numbing medicine (local anesthetic) will be injected. A small cut (incision) will be made in your breast. The tip of the biopsy needle will be directed through the incision. Several small pieces of suspicious tissue will be taken. Then, a final set of X-ray images will be obtained. If they show that the suspicious tissue has been mostly or completely removed, a small clip will be left at the biopsy site. This is done so that the biopsy site can be easily located if the results of the biopsy show that the tissue is cancerous.  After the procedure, the incision will be stitched (sutured) or taped and covered with a bandage (dressing). Your health care provider may apply a pressure dressing and an ice  pack to prevent bleeding and swelling in the breast.  A stereotactic breast biopsy can take 30 minutes or more. AFTER THE PROCEDURE  If you are doing well and have no problems, you will be allowed to go home.    This information is not intended to replace advice given to you by your health care provider. Make sure you discuss any questions you have with your health care provider.   Document Released:  10/03/2002 Document Revised: 01/09/2013 Document Reviewed: 08/03/2012 Elsevier Interactive Patient Education [employer].

## 2014-12-19 NOTE — Progress Notes (Signed)
Patient ID: Leslie [name], female   DOB: 04/28/1965, 49 y.o.   MRN: [id number]  Chief Complaint  Patient presents with  . Follow-up    mammogram    HPI Leslie [name] is a 49 y.o. female.  who presents for a breast evaluation. The most recent mammogram with added views was done on 12-17-14.  Patient does perform regular self breast checks and gets regular mammograms done.     HPI  Past Medical History  Diagnosis Date  . Hypertension   . Hyperlipidemia   . Allergy   . Anxiety   . GERD (gastroesophageal reflux disease)     Past Surgical History  Procedure Laterality Date  . Abdominal hysterectomy    . Knee surgery    . Breast biopsy Bilateral 2012  . Breast biopsy Right 08-07-12    fibroadenomatous changes and columnar cells  . Abdominal hysterectomy    . Tubal ligation      Family History  Problem Relation Age of Onset  . Cancer Mother 23    breast  . Cancer Maternal Aunt     breast  . Cancer Maternal Grandmother     breast    Social History Social History  Substance Use Topics  . Smoking status: Current Every Day Smoker    Types: Cigarettes  . Smokeless tobacco: Never Used  . Alcohol Use: 1.2 oz/week    2 Glasses of wine per week    Allergies  Allergen Reactions  . Morphine And Related Hives    Current Outpatient Prescriptions  Medication Sig Dispense Refill  . albuterol (PROVENTIL HFA;VENTOLIN HFA) 108 (90 BASE) MCG/ACT inhaler Inhale 2 puffs into the lungs every 6 (six) hours as needed for wheezing or shortness of breath.    . ALPRAZolam (XANAX) 0.5 MG tablet Take 0.5 mg by mouth 2 (two) times daily as needed for anxiety.    Marland Kitchen atorvastatin (LIPITOR) 20 MG tablet Take 20 mg by mouth daily.     . cetirizine (ZYRTEC) 10 MG tablet Take 10 mg by mouth at bedtime.    Marland Kitchen estradiol (ESTRACE) 0.5 MG tablet Take 0.5 mg by mouth daily.    . fluticasone (FLONASE) 50 MCG/ACT nasal spray Place 2 sprays into both nostrils daily as needed for allergies or rhinitis.     . hydrochlorothiazide (HYDRODIURIL) 25 MG tablet Take 25 mg by mouth daily.     Marland Kitchen ibuprofen (ADVIL,MOTRIN) 800 MG tablet Take 800 mg by mouth every 8 (eight) hours as needed.     . lansoprazole (PREVACID) 30 MG capsule Take 30 mg by mouth daily.    . montelukast (SINGULAIR) 10 MG tablet Take 10 mg by mouth at bedtime.    . Multiple Vitamin (MULTIVITAMIN) capsule Take 1 capsule by mouth daily.    . Omega-3 Fatty Acids (FISH OIL) 1000 MG CAPS Take by mouth daily.     No current facility-administered medications for this visit.    Review of Systems Review of Systems  Constitutional: Negative.   Respiratory: Negative.   Cardiovascular: Negative.     Blood pressure 130/74, pulse 88, resp. rate 16, height 5\' 4"  (1.626 m), weight 231 lb (104.781 kg).  Physical Exam Physical Exam  Constitutional: She is oriented to person, place, and time. She appears well-developed and well-nourished.  Eyes: Conjunctivae are normal. No scleral icterus.  Neck: Neck supple.  Cardiovascular: Normal rate, regular rhythm and normal heart sounds.   Pulmonary/Chest: Effort normal and breath sounds normal. Right breast exhibits no inverted  nipple, no mass, no nipple discharge, no skin change and no tenderness. Left breast exhibits inverted nipple (chronic). Left breast exhibits no mass, no nipple discharge, no skin change and no tenderness.  Abdominal: Soft. Bowel sounds are normal. There is no hepatomegaly. There is no tenderness.  Lymphadenopathy:    She has no cervical adenopathy.    She has no axillary adenopathy.  Neurological: She is alert and oriented to person, place, and time.  Skin: Skin is warm and dry.  Psychiatric: She has a normal mood and affect. Her behavior is normal.    Data Reviewed Progress notes, mammogram with additional images. Focus of microcalcifications in right breast -needs stereo biopsy Assessment    Stable physical exam. FCD and FH of breast CA.  Microcalcifications detected  in right breast on mammogram.      Plan    Plan for stereotactic biopsy of right breast. Discussed fully with pt and she is agreeable.      PCP:  Gerline Legacy G 12/20/2014, 9:08 AM

## 2014-12-20 ENCOUNTER — Telehealth: Payer: Self-pay

## 2014-12-20 ENCOUNTER — Other Ambulatory Visit: Payer: Self-pay | Admitting: General Surgery

## 2014-12-20 ENCOUNTER — Ambulatory Visit
Admission: RE | Admit: 2014-12-20 | Discharge: 2014-12-20 | Disposition: A | Discharge status: Home / Self Care | Payer: Managed Care, Other (non HMO) | Source: Ambulatory Visit | Attending: Nurse Practitioner | Admitting: Nurse Practitioner

## 2014-12-20 DIAGNOSIS — R918 Other nonspecific abnormal finding of lung field: Secondary | ICD-10-CM | POA: Diagnosis not present

## 2014-12-20 DIAGNOSIS — R921 Mammographic calcification found on diagnostic imaging of breast: Secondary | ICD-10-CM

## 2014-12-20 DIAGNOSIS — Z01818 Encounter for other preprocedural examination: Secondary | ICD-10-CM | POA: Insufficient documentation

## 2014-12-20 DIAGNOSIS — Z01810 Encounter for preprocedural cardiovascular examination: Secondary | ICD-10-CM

## 2014-12-20 NOTE — Telephone Encounter (Signed)
Spoke with patient about scheduling her right stereo biopsy. Patient is unable to have this done on 12/23/14 and will be having knee surgery on 12/25/14 and will be out for a while with physical therapy. Patient is scheduled for a Right Stereo Biopsy at [facility] on 02/03/15 at 3:00 pm. She will arrive there at 2:30 pm. Patient is aware of date, time, and instructions. Information packet mailed to her home. Patient advised we will call her a week prior to review instructions.

## 2014-12-23 ENCOUNTER — Ambulatory Visit: Payer: Managed Care, Other (non HMO)

## 2014-12-23 NOTE — OR Nursing (Addendum)
Repeat cxr 12/20/14 stable no infiltrates or atelectasis Spoke with Judeen Hammans at [facility] and she will chck on status of final clearance note.

## 2014-12-24 LAB — PREPARE RBC (CROSSMATCH)

## 2014-12-25 ENCOUNTER — Inpatient Hospital Stay: Payer: Managed Care, Other (non HMO)

## 2014-12-25 ENCOUNTER — Encounter
Admission: RE | Disposition: A | Discharge status: Home Health | Payer: Self-pay | Source: Ambulatory Visit | Attending: Orthopedic Surgery

## 2014-12-25 ENCOUNTER — Inpatient Hospital Stay: Payer: Managed Care, Other (non HMO) | Admitting: Anesthesiology

## 2014-12-25 ENCOUNTER — Encounter: Payer: Self-pay | Admitting: *Deleted

## 2014-12-25 ENCOUNTER — Inpatient Hospital Stay
Admission: RE | Admit: 2014-12-25 | Discharge: 2014-12-28 | DRG: 470 | Disposition: A | Discharge status: Home Health | Payer: Managed Care, Other (non HMO) | Source: Ambulatory Visit | Attending: Orthopedic Surgery | Admitting: Orthopedic Surgery

## 2014-12-25 DIAGNOSIS — Z79899 Other long term (current) drug therapy: Secondary | ICD-10-CM

## 2014-12-25 DIAGNOSIS — Z885 Allergy status to narcotic agent status: Secondary | ICD-10-CM

## 2014-12-25 DIAGNOSIS — Z79891 Long term (current) use of opiate analgesic: Secondary | ICD-10-CM

## 2014-12-25 DIAGNOSIS — Z96652 Presence of left artificial knee joint: Secondary | ICD-10-CM

## 2014-12-25 DIAGNOSIS — Z96659 Presence of unspecified artificial knee joint: Secondary | ICD-10-CM

## 2014-12-25 DIAGNOSIS — R112 Nausea with vomiting, unspecified: Secondary | ICD-10-CM | POA: Diagnosis not present

## 2014-12-25 DIAGNOSIS — E785 Hyperlipidemia, unspecified: Secondary | ICD-10-CM | POA: Diagnosis present

## 2014-12-25 DIAGNOSIS — M1712 Unilateral primary osteoarthritis, left knee: Secondary | ICD-10-CM | POA: Diagnosis present

## 2014-12-25 DIAGNOSIS — L299 Pruritus, unspecified: Secondary | ICD-10-CM | POA: Diagnosis not present

## 2014-12-25 DIAGNOSIS — I10 Essential (primary) hypertension: Secondary | ICD-10-CM | POA: Diagnosis present

## 2014-12-25 DIAGNOSIS — K219 Gastro-esophageal reflux disease without esophagitis: Secondary | ICD-10-CM | POA: Diagnosis present

## 2014-12-25 HISTORY — PX: TOTAL KNEE ARTHROPLASTY: SHX125

## 2014-12-25 SURGERY — ARTHROPLASTY, KNEE, TOTAL
Anesthesia: Spinal | Site: Knee | Laterality: Left | Wound class: Clean

## 2014-12-25 MED ORDER — CELECOXIB 200 MG PO CAPS
200.0000 mg | ORAL_CAPSULE | Freq: Two times a day (BID) | ORAL | Status: DC
  Administered 2014-12-25 – 2014-12-28 (×6): 200 mg via ORAL
  Filled 2014-12-25 (×7): qty 1

## 2014-12-25 MED ORDER — CLINDAMYCIN PHOSPHATE 600 MG/50ML IV SOLN
INTRAVENOUS | Status: AC
  Administered 2014-12-25: 600 mg via INTRAVENOUS
  Filled 2014-12-25: qty 50

## 2014-12-25 MED ORDER — CEFAZOLIN SODIUM-DEXTROSE 2-3 GM-% IV SOLR
2.0000 g | Freq: Once | INTRAVENOUS | Status: AC
  Administered 2014-12-25: 2 g via INTRAVENOUS

## 2014-12-25 MED ORDER — CEFAZOLIN SODIUM-DEXTROSE 2-3 GM-% IV SOLR
INTRAVENOUS | Status: AC
  Filled 2014-12-25: qty 50

## 2014-12-25 MED ORDER — NICOTINE 21 MG/24HR TD PT24
21.0000 mg | MEDICATED_PATCH | Freq: Every day | TRANSDERMAL | Status: DC
  Administered 2014-12-25 – 2014-12-28 (×4): 21 mg via TRANSDERMAL
  Filled 2014-12-25 (×4): qty 1

## 2014-12-25 MED ORDER — PHENOL 1.4 % MT LIQD: 1.0000 | OROMUCOSAL | Status: DC | PRN

## 2014-12-25 MED ORDER — ALPRAZOLAM 0.5 MG PO TABS
0.5000 mg | ORAL_TABLET | Freq: Two times a day (BID) | ORAL | Status: DC | PRN
  Administered 2014-12-26 – 2014-12-27 (×2): 0.5 mg via ORAL
  Filled 2014-12-25 (×2): qty 1

## 2014-12-25 MED ORDER — ACETAMINOPHEN 650 MG RE SUPP: 650.0000 mg | Freq: Four times a day (QID) | RECTAL | Status: DC | PRN

## 2014-12-25 MED ORDER — HYDROMORPHONE HCL 1 MG/ML IJ SOLN
1.0000 mg | INTRAMUSCULAR | Status: DC | PRN
  Administered 2014-12-25 – 2014-12-26 (×6): 1 mg via INTRAVENOUS
  Filled 2014-12-25 (×6): qty 1

## 2014-12-25 MED ORDER — METOCLOPRAMIDE HCL 5 MG PO TABS
5.0000 mg | ORAL_TABLET | Freq: Three times a day (TID) | ORAL | Status: DC | PRN
  Administered 2014-12-26: 5 mg via ORAL
  Filled 2014-12-25: qty 1

## 2014-12-25 MED ORDER — HYDROMORPHONE HCL 1 MG/ML IJ SOLN
0.5000 mg | INTRAMUSCULAR | Status: DC | PRN
  Administered 2014-12-25: 0.5 mg via INTRAVENOUS

## 2014-12-25 MED ORDER — FLUTICASONE PROPIONATE 50 MCG/ACT NA SUSP
2.0000 | Freq: Every day | NASAL | Status: DC | PRN
  Filled 2014-12-25: qty 16

## 2014-12-25 MED ORDER — SODIUM CHLORIDE 0.9 % IV SOLN
INTRAVENOUS | Status: DC | PRN
  Administered 2014-12-25: 120 mL

## 2014-12-25 MED ORDER — PANTOPRAZOLE SODIUM 40 MG PO TBEC
40.0000 mg | DELAYED_RELEASE_TABLET | Freq: Every day | ORAL | Status: DC
  Administered 2014-12-26 – 2014-12-28 (×3): 40 mg via ORAL
  Filled 2014-12-25 (×3): qty 1

## 2014-12-25 MED ORDER — DOCUSATE SODIUM 100 MG PO CAPS
100.0000 mg | ORAL_CAPSULE | Freq: Two times a day (BID) | ORAL | Status: DC
  Administered 2014-12-25 – 2014-12-28 (×6): 100 mg via ORAL
  Filled 2014-12-25 (×7): qty 1

## 2014-12-25 MED ORDER — LACTATED RINGERS IV SOLN
INTRAVENOUS | Status: DC
  Administered 2014-12-25: 12:00:00 via INTRAVENOUS

## 2014-12-25 MED ORDER — ALBUTEROL SULFATE (2.5 MG/3ML) 0.083% IN NEBU: 2.5000 mg | INHALATION_SOLUTION | Freq: Four times a day (QID) | RESPIRATORY_TRACT | Status: DC | PRN

## 2014-12-25 MED ORDER — METHOCARBAMOL 1000 MG/10ML IJ SOLN: 500.0000 mg | Freq: Four times a day (QID) | INTRAVENOUS | Status: DC | PRN

## 2014-12-25 MED ORDER — BUPIVACAINE HCL (PF) 0.5 % IJ SOLN
INTRAMUSCULAR | Status: DC | PRN
Start: 2014-12-25 — End: 2014-12-25
  Administered 2014-12-25: 3 mL

## 2014-12-25 MED ORDER — BISACODYL 5 MG PO TBEC
5.0000 mg | DELAYED_RELEASE_TABLET | Freq: Every day | ORAL | Status: DC | PRN
  Administered 2014-12-27: 5 mg via ORAL
  Filled 2014-12-25: qty 1

## 2014-12-25 MED ORDER — ENOXAPARIN SODIUM 30 MG/0.3ML ~~LOC~~ SOLN
30.0000 mg | Freq: Two times a day (BID) | SUBCUTANEOUS | Status: DC
  Administered 2014-12-26 – 2014-12-28 (×5): 30 mg via SUBCUTANEOUS
  Filled 2014-12-25 (×5): qty 0.3

## 2014-12-25 MED ORDER — ATORVASTATIN CALCIUM 20 MG PO TABS
20.0000 mg | ORAL_TABLET | Freq: Every day | ORAL | Status: DC
  Administered 2014-12-26 – 2014-12-28 (×3): 20 mg via ORAL
  Filled 2014-12-25 (×4): qty 1

## 2014-12-25 MED ORDER — MIDAZOLAM HCL 5 MG/5ML IJ SOLN
INTRAMUSCULAR | Status: DC | PRN
  Administered 2014-12-25 (×2): 1 mg via INTRAVENOUS

## 2014-12-25 MED ORDER — MENTHOL 3 MG MT LOZG: 1.0000 | LOZENGE | OROMUCOSAL | Status: DC | PRN

## 2014-12-25 MED ORDER — HYDROCHLOROTHIAZIDE 25 MG PO TABS
25.0000 mg | ORAL_TABLET | Freq: Every day | ORAL | Status: DC
  Administered 2014-12-25 – 2014-12-28 (×4): 25 mg via ORAL
  Filled 2014-12-25 (×4): qty 1

## 2014-12-25 MED ORDER — FENTANYL CITRATE (PF) 100 MCG/2ML IJ SOLN: 25.0000 ug | INTRAMUSCULAR | Status: DC | PRN

## 2014-12-25 MED ORDER — ACETAMINOPHEN 325 MG PO TABS
650.0000 mg | ORAL_TABLET | Freq: Four times a day (QID) | ORAL | Status: DC | PRN
  Administered 2014-12-26: 650 mg via ORAL
  Filled 2014-12-25: qty 2

## 2014-12-25 MED ORDER — LORATADINE 10 MG PO TABS
10.0000 mg | ORAL_TABLET | Freq: Every day | ORAL | Status: DC
  Administered 2014-12-25 – 2014-12-28 (×4): 10 mg via ORAL
  Filled 2014-12-25 (×5): qty 1

## 2014-12-25 MED ORDER — ADULT MULTIVITAMIN W/MINERALS CH
1.0000 | ORAL_TABLET | Freq: Every day | ORAL | Status: DC
  Administered 2014-12-25 – 2014-12-28 (×4): 1 via ORAL
  Filled 2014-12-25 (×4): qty 1

## 2014-12-25 MED ORDER — BUPIVACAINE LIPOSOME 1.3 % IJ SUSP
INTRAMUSCULAR | Status: AC
  Filled 2014-12-25: qty 20

## 2014-12-25 MED ORDER — CLINDAMYCIN PHOSPHATE 600 MG/50ML IV SOLN: 600.0000 mg | Freq: Once | INTRAVENOUS | Status: DC

## 2014-12-25 MED ORDER — SODIUM CHLORIDE 0.9 % IV SOLN
INTRAVENOUS | Status: DC
  Administered 2014-12-25 – 2014-12-26 (×2): via INTRAVENOUS

## 2014-12-25 MED ORDER — OXYCODONE HCL 5 MG PO TABS
10.0000 mg | ORAL_TABLET | ORAL | Status: DC | PRN
  Administered 2014-12-25: 15 mg via ORAL
  Administered 2014-12-25: 10 mg via ORAL
  Administered 2014-12-25: 5 mg via ORAL
  Administered 2014-12-26: 15 mg via ORAL
  Filled 2014-12-25: qty 3
  Filled 2014-12-25: qty 1
  Filled 2014-12-25: qty 3
  Filled 2014-12-25: qty 2

## 2014-12-25 MED ORDER — METHOCARBAMOL 500 MG PO TABS
500.0000 mg | ORAL_TABLET | Freq: Four times a day (QID) | ORAL | Status: DC | PRN
  Administered 2014-12-25 – 2014-12-26 (×2): 500 mg via ORAL
  Filled 2014-12-25 (×2): qty 1

## 2014-12-25 MED ORDER — PROPOFOL 500 MG/50ML IV EMUL
INTRAVENOUS | Status: DC | PRN
  Administered 2014-12-25: 120 ug/kg/min via INTRAVENOUS

## 2014-12-25 MED ORDER — ESTRADIOL 1 MG PO TABS
0.5000 mg | ORAL_TABLET | Freq: Every day | ORAL | Status: DC
  Administered 2014-12-25 – 2014-12-28 (×4): 0.5 mg via ORAL
  Filled 2014-12-25 (×4): qty 1

## 2014-12-25 MED ORDER — MAGNESIUM HYDROXIDE 400 MG/5ML PO SUSP
30.0000 mL | Freq: Every day | ORAL | Status: DC | PRN
  Administered 2014-12-26 – 2014-12-28 (×3): 30 mL via ORAL
  Filled 2014-12-25 (×3): qty 30

## 2014-12-25 MED ORDER — ONDANSETRON HCL 4 MG PO TABS
4.0000 mg | ORAL_TABLET | Freq: Four times a day (QID) | ORAL | Status: DC | PRN
Start: 2014-12-25 — End: 2014-12-28

## 2014-12-25 MED ORDER — ONDANSETRON HCL 4 MG/2ML IJ SOLN: 4.0000 mg | Freq: Once | INTRAMUSCULAR | Status: DC | PRN

## 2014-12-25 MED ORDER — METOCLOPRAMIDE HCL 5 MG/ML IJ SOLN
5.0000 mg | Freq: Three times a day (TID) | INTRAMUSCULAR | Status: DC | PRN
  Administered 2014-12-26: 10 mg via INTRAVENOUS
  Filled 2014-12-25: qty 2

## 2014-12-25 MED ORDER — ALUM & MAG HYDROXIDE-SIMETH 200-200-20 MG/5ML PO SUSP: 30.0000 mL | ORAL | Status: DC | PRN

## 2014-12-25 MED ORDER — ONDANSETRON HCL 4 MG/2ML IJ SOLN
4.0000 mg | Freq: Four times a day (QID) | INTRAMUSCULAR | Status: DC | PRN
  Administered 2014-12-25 – 2014-12-26 (×2): 4 mg via INTRAVENOUS
  Filled 2014-12-25 (×2): qty 2

## 2014-12-25 MED ORDER — DIPHENHYDRAMINE HCL 12.5 MG/5ML PO ELIX
12.5000 mg | ORAL_SOLUTION | ORAL | Status: DC | PRN
  Administered 2014-12-26 (×3): 25 mg via ORAL
  Filled 2014-12-25 (×3): qty 10

## 2014-12-25 MED ORDER — GLYCOPYRROLATE 0.2 MG/ML IJ SOLN
INTRAMUSCULAR | Status: DC | PRN
  Administered 2014-12-25: 0.2 mg via INTRAVENOUS

## 2014-12-25 MED ORDER — MONTELUKAST SODIUM 10 MG PO TABS
10.0000 mg | ORAL_TABLET | Freq: Every day | ORAL | Status: DC
  Administered 2014-12-25 – 2014-12-27 (×3): 10 mg via ORAL
  Filled 2014-12-25 (×3): qty 1

## 2014-12-25 MED ORDER — PHENYLEPHRINE HCL 10 MG/ML IJ SOLN
INTRAMUSCULAR | Status: DC | PRN
  Administered 2014-12-25 (×3): 50 ug via INTRAVENOUS

## 2014-12-25 MED ORDER — SODIUM CHLORIDE 0.9 % IJ SOLN
INTRAMUSCULAR | Status: AC
  Filled 2014-12-25: qty 50

## 2014-12-25 MED ORDER — NEOMYCIN-POLYMYXIN B GU 40-200000 IR SOLN
Status: DC | PRN
  Administered 2014-12-25: 16 mL

## 2014-12-25 MED ORDER — LABETALOL HCL 5 MG/ML IV SOLN
10.0000 mg | Freq: Once | INTRAVENOUS | Status: AC
  Administered 2014-12-25: 10 mg via INTRAVENOUS

## 2014-12-25 SURGICAL SUPPLY — 58 items
AUTOTRANSFUS HAS 1/8 (MISCELLANEOUS) ×2
BAG DECANTER FOR FLEXI CONT (MISCELLANEOUS) ×2 IMPLANT
BLADE DEBAKEY 8.0 (BLADE) ×2 IMPLANT
BLADE SAW 1 (BLADE) ×2 IMPLANT
BLADE SAW 1/2 (BLADE) ×2 IMPLANT
BLADE SURG 15 STRL LF DISP TIS (BLADE) ×1 IMPLANT
BLADE SURG 15 STRL SS (BLADE) ×1
CANISTER SUCT 1200ML W/VALVE (MISCELLANEOUS) ×4 IMPLANT
CAP KNEE TOTAL 3 SIGMA ×2 IMPLANT
CATH TRAY METER 16FR LF (MISCELLANEOUS) ×2 IMPLANT
CEMENT HV SMART SET (Cement) ×4 IMPLANT
COOLER POLAR GLACIER W/PUMP (MISCELLANEOUS) ×2 IMPLANT
DRAPE IMP U-DRAPE 54X76 (DRAPES) ×2 IMPLANT
DRAPE INCISE IOBAN 66X60 STRL (DRAPES) ×2 IMPLANT
DRAPE SHEET LG 3/4 BI-LAMINATE (DRAPES) ×2 IMPLANT
DRAPE SURG 17X11 SM STRL (DRAPES) ×4 IMPLANT
DRSG OPSITE POSTOP 4X12 (GAUZE/BANDAGES/DRESSINGS) ×2 IMPLANT
DRSG OPSITE POSTOP 4X14 (GAUZE/BANDAGES/DRESSINGS) IMPLANT
DURAPREP 26ML APPLICATOR (WOUND CARE) ×6 IMPLANT
GAUZE PETRO XEROFOAM 1X8 (MISCELLANEOUS) IMPLANT
GAUZE SPONGE 4X4 12PLY STRL (GAUZE/BANDAGES/DRESSINGS) IMPLANT
GLOVE BIOGEL PI IND STRL 9 (GLOVE) ×1 IMPLANT
GLOVE BIOGEL PI INDICATOR 9 (GLOVE) ×1
GLOVE SURG 9.0 ORTHO LTXF (GLOVE) ×6 IMPLANT
GOWN STRL REUS TWL 2XL XL LVL4 (GOWN DISPOSABLE) ×2 IMPLANT
GOWN STRL REUS W/ TWL LRG LVL3 (GOWN DISPOSABLE) ×4 IMPLANT
GOWN STRL REUS W/TWL LRG LVL3 (GOWN DISPOSABLE) ×4
GOWN STRL REUS W/TWL XL LVL4 (GOWN DISPOSABLE) ×2 IMPLANT
HANDPIECE SUCTION TUBG SURGILV (MISCELLANEOUS) ×2 IMPLANT
IMMBOLIZER KNEE 19 BLUE UNIV (SOFTGOODS) ×2 IMPLANT
IV NS 100ML SINGLE PACK (IV SOLUTION) ×2 IMPLANT
KIT RM TURNOVER STRD PROC AR (KITS) ×2 IMPLANT
NDL SAFETY 18GX1.5 (NEEDLE) ×2 IMPLANT
NDL SAFETY 22GX1.5 (NEEDLE) ×2 IMPLANT
NEEDLE SPNL 20GX3.5 QUINCKE YW (NEEDLE) ×2 IMPLANT
NS IRRIG 1000ML POUR BTL (IV SOLUTION) ×2 IMPLANT
PACK TOTAL KNEE (MISCELLANEOUS) ×2 IMPLANT
PAD GROUND ADULT SPLIT (MISCELLANEOUS) ×2 IMPLANT
PAD PREP 24X41 OB/GYN DISP (PERSONAL CARE ITEMS) ×2 IMPLANT
PAD WRAPON POLAR KNEE (MISCELLANEOUS) ×1 IMPLANT
SOL .9 NS 3000ML IRR  AL (IV SOLUTION) ×1
SOL .9 NS 3000ML IRR UROMATIC (IV SOLUTION) ×1 IMPLANT
SPONGE DRAIN TRACH 4X4 STRL 2S (GAUZE/BANDAGES/DRESSINGS) ×2 IMPLANT
SPONGE LAP 18X18 5 PK (GAUZE/BANDAGES/DRESSINGS) IMPLANT
STAPLER SKIN PROX 35W (STAPLE) ×2 IMPLANT
STRIP CLOSURE SKIN 1/2X4 (GAUZE/BANDAGES/DRESSINGS) IMPLANT
SUCTION FRAZIER TIP 10 FR DISP (SUCTIONS) ×2 IMPLANT
SUT ETHIBOND NAB CT1 #1 30IN (SUTURE) ×4 IMPLANT
SUT MNCRL AB 3-0 PS2 27 (SUTURE) ×4 IMPLANT
SUT VIC AB 0 CT1 36 (SUTURE) ×2 IMPLANT
SUT VIC AB 2-0 CT1 (SUTURE) ×4 IMPLANT
SYR 20CC LL (SYRINGE) ×2 IMPLANT
SYR 30ML LL (SYRINGE) ×2 IMPLANT
SYR 50ML LL SCALE MARK (SYRINGE) ×2 IMPLANT
SYSTEM AUTOTRANSFUS DUAL TROCR (MISCELLANEOUS) ×1 IMPLANT
TOWER CARTRIDGE SMART MIX (DISPOSABLE) ×2 IMPLANT
TUBE SUCT KAM VAC (TUBING) ×2 IMPLANT
WRAPON POLAR PAD KNEE (MISCELLANEOUS) ×2

## 2014-12-25 NOTE — Progress Notes (Signed)
  Subjective:  POST-OP CHECK:  Patient is seen in her hospital room. Her nurse, [place], is at the bedside. Patient's family members also in the room. Patient reports pain as moderate.  Her spinal block is wearing off. She denies any nausea or vomiting.  Objective:   VITALS:   Filed Vitals:   12/25/14 1710 12/25/14 1714 12/25/14 1719 12/25/14 1737  BP: 150/82 147/85 143/87 139/78  Pulse: 79 85 78 82  Temp:   97.4 F (36.3 C) 98.2 F (36.8 C)  TempSrc:    Oral  Resp: 14 18 17 16   Height:      Weight:      SpO2: 94% 92% 95% 97%    PHYSICAL EXAM:  Left lower extremity: Patient's knee immobilizers in place. She has an Autovac drain. Patient has palpable pedal pulses. She has intact sensation light touch and can dorsiflex and plantarflex her ankle and flex and extend her toes.   LABS  No results found for this or any previous visit (from the past 24 hour(s)).  Dg Knee Left Port  12/25/2014  CLINICAL DATA:  Post left knee replacement EXAM: PORTABLE LEFT KNEE - 1-2 VIEW COMPARISON:  02/22/2012 FINDINGS: Two portable views of the left knee submitted. There is left knee prosthesis with anatomic alignment. Postsurgical changes are noted with midline skin staples. Postsurgical drain in place. IMPRESSION: Left knee prosthesis with anatomic alignment. Postsurgical changes are noted. Electronically Signed   By: Lahoma Crocker M.D.   On: 12/25/2014 17:02    Assessment/Plan: Day of Surgery   Active Problems:   S/P total knee replacement using cement  Patient is stable postop. She will complete 24 hours postop antibiotics. Her Foley catheter will be removed tomorrow. She will have labs re-checked in the a.m. Physical therapy will begin tomorrow. Her Hemovac drain was also be removed tomorrow. Patient will continue her knee immobilizer and Polar Care and TENS unit today. She has received Dilaudid for pain and is also prescribed oxycodone for analgesia. Patient will begin Lovenox tomorrow for DVT  prophylaxis.Marland Kitchen She has foot pumps ordered as well. I reviewed the postoperative x-rays in the PACU which shows the total knee arthroplasty components are well-positioned.    Thornton Park , MD 12/25/2014, 6:06 PM

## 2014-12-25 NOTE — Anesthesia Preprocedure Evaluation (Signed)
Anesthesia Evaluation  Patient identified by MRN, date of birth, ID band Patient awake    Reviewed: Allergy & Precautions, NPO status , Patient's Chart, lab work & pertinent test results  History of Anesthesia Complications Negative for: history of anesthetic complications  Airway Mallampati: II       Dental   Pulmonary asthma , Current Smoker,           Cardiovascular hypertension, Pt. on medications + Valvular Problems/Murmurs (no tx)      Neuro/Psych Anxiety negative neurological ROS     GI/Hepatic Neg liver ROS, GERD  Medicated and Controlled,  Endo/Other  negative endocrine ROS  Renal/GU negative Renal ROS     Musculoskeletal   Abdominal   Peds  Hematology negative hematology ROS (+)   Anesthesia Other Findings   Reproductive/Obstetrics                             Anesthesia Physical Anesthesia Plan  ASA: III  Anesthesia Plan: Spinal   Post-op Pain Management:    Induction: Intravenous  Airway Management Planned: Nasal Cannula  Additional Equipment:   Intra-op Plan:   Post-operative Plan:   Informed Consent: I have reviewed the patients History and Physical, chart, labs and discussed the procedure including the risks, benefits and alternatives for the proposed anesthesia with the patient or authorized representative who has indicated his/her understanding and acceptance.     Plan Discussed with:   Anesthesia Plan Comments:         Anesthesia Quick Evaluation

## 2014-12-25 NOTE — H&P (Signed)
The patient has been re-examined, and the chart reviewed, and there have been no interval changes to the documented history and physical.    The risks, benefits, and alternatives have been discussed at length, and the patient is willing to proceed.   

## 2014-12-25 NOTE — Op Note (Signed)
DATE OF SURGERY:  12/25/2014 TIME: 4:22 PM  PATIENT NAME:  Leslie [name]   AGE: 49 y.o.    PRE-OPERATIVE DIAGNOSIS:  OSTEOARTHRITIS LEFT KNEE  POST-OPERATIVE DIAGNOSIS:  Same  PROCEDURE:  Procedure(s): TOTAL KNEE ARTHROPLASTY  SURGEON:  Thornton Park, MD   OPERATIVE IMPLANTS: Depuy PFC Sigma, Posterior Stabilized Femural component size 2.5, Tibia size rotating platform component size 2.5, Patella polyethylene 3-peg oval button size 38, with a 10 mm polyethylene insert.   PREOPERATIVE INDICATIONS:  Leslie [name] is an 49 y.o. female who has a diagnosis of left knee osteoarthritis who elected for a left total knee arthroplasty after failing nonoperative treatment, including NSAIDs, bracing, corticosteroid and hyaluronic acid injections who has significant impairment of their activities of daily living including pain with ambulation and prolonged standing.  Radiographs have demonstrated tricompartmental osteoarthritis joint space narrowing, and osteophye formation and subchondral sclerosis.  The risks, benefits, and alternatives were discussed at length including but not limited to the risks of infection, bleeding, nerve or blood vessel injury, knee stiffness, fracture, dislocation, loosening or failure of the hardware and the need for further surgery. Medical risks include but not limited to DVT and pulmonary embolism, myocardial infarction, stroke, pneumonia, respiratory failure and death. I discussed these risks with the patient in my office prior to the date of surgery. They understood these risks and were willing to proceed.  OPERATIVE FINDINGS AND UNIQUE ASPECTS OF THE CASE:  Tricompartmental osteoarthritis with large osteophytes  OPERATIVE DESCRIPTION:  The patient was brought to the operative room and placed in a supine position after undergoing placement of a spinal anesthetic.  A Foley catheter was placed.  IV antibiotics were given. Patient received 2 g of IV Kefzol and 600  mg IV clindamycin. The lower extremity was prepped and draped in the usual sterile fashion.  A time out was performed to verify the patient's name, date of birth, medical record number, correct site of surgery and correct procedure to be performed. The timeout was also used to confirm the patient received antibiotics and that appropriate instruments, implants and radiographs studies were available in the room.  The leg was elevated and exsanguinated with an Esmarch and the tourniquet was inflated to 275 mmHg for 123 minutes..  A midline incision was made over the left knee. Full-thickness skin flaps were developed. A medial parapatellar arthrotomy was then made and the patella everted and the knee was brought into 90 of flexion. Hoffa's fat pad along with the cruciate ligaments and medial and lateral menisci were resected.   The distal femoral intramedullary canal was opened with a drill and the intramedullary distal femoral cutting jig was inserted into the femoral canal pinned into position. It was set at 5 degrees resecting 10 mm off the distal femur.  Care was taken to protect the collateral ligaments during distal femoral resection.  The distal femoral resection was performed with an oscillating saw. The femoral cutting guide was then removed.  The extramedullary tibial cutting guide was then placed using the anterior tibial crest and second ray of the foot as a references.  The tibial cutting guide was adjusted to allow for appropriate posterior slope.  The tibial cutting block was pinned into position. The slotted stylus was used to measure the proximal tibial resection of 10 mm off the high lateral side.  The tibial long rod alignment guide was then used to confirm position of the cutting block. A third cross pin through the tibial cutting block was then  drilled into position to allow for rotational stability. Care was taken during the tibial resection to protect the medial and collateral ligaments.   The resected tibial bone was removed along with the posterior horns of the menisci.  The PCL was sacrificed.  Extension gap was measured with a spacer block and alignment and extension was confirmed using a long alignment rod.  The attention was then turned back to the femur. The posterior referencing distal femoral sizing guide was applied to the distal femur.  The femur was sized to be a 4. Rotation of the referencing guide was checked with the epicondylar axis and Whitesides line. Then the 4-in-1 cutting jig was then applied to the distal femur. A stylus was used to confirm that the anterior femur would not be notched.   Then the anterior, posterior and chamfer femoral cuts were then made with an oscillating saw.  All posterior osteophytes were removed.  The flexion gap was then measured with a flexion spacer block and long alignment rod and was found to be symmetric with the extension gap and perpendicular to mechanical axis of the tibia.  The distal femoral preparation was completed by performing the posterior stabilized box cut using the cutting block. The entry site for the intramedullary femoral guide was filled with autologous bone graft from bone previously resected earlier in the case.  The proximal tibia plateau was then sized with trial trays. The best coverage was achieved with a size 2.5. This tibial tray was then pinned into position. The proximal tibia was then prepared with the reamer and keel punch.  After tibial preparation was completed, all trial components were inserted with polyethylene trials.  The knee was found to have excellent balance and full motion with a size 10 mm tibial polyethylene insert..    The attention was then turned to preparation of the patella. The thickness of the patella was measured with a caliper, the diameter measured with the patella templates.  The patella resection was then made with an oscillating saw using the patella cutting guide.  The final patellar  thickness was 14 mm.  3 peg holes for the patella component were then drilled. The trial patella was then placed. Knee was taken through a full range of motion and deemed to be stable with the trial components. All trial components were then removed. The knee capsule was then injected with Exparel. The joint was copiously irrigated with pulse lavage.  The final total knee arthroplasty components were then cemented into place with a 10 mm trial polyethylene insert and all excess methylmethacrylate was removed.  The joint was again copiously irrigated. After the cement had hardened the knee was again taken through a full range of motion. It was felt to be most stable with the 10 mm tibial polyethylene insert. The actual tibial polyethylene insert was then placed.   The knee was taken through a range of motion and the patella tracked well and the knee was again irrigated copiously.    An Autovac drain was placed. The 2 drain limbs were brought out through the superior lateral knee. The medial arthrotomy was closed with #1 Ethibond. The subcutaneous tissue closed with 0 and 2-0 vicryl, and skin approximated with staples.  A dry sterile and compressive dressing was applied.  A Polar Care was applied to the operative knee along with TENS unit pads and a knee immobilizer.  The patient was awakened and brought to the PACU in stable and satisfactory condition.  All sharp, lap  and instrument counts were correct at the conclusion the case. I spoke with the patient's family in the postop consultation room to let them know the case it done without complication and the patient was stable in recovery room.   Total tourniquet time was 123 minutes.    Timoteo Gaul, MD

## 2014-12-25 NOTE — Anesthesia Procedure Notes (Signed)
Spinal  Start time: 12/25/2014 1:22 PM End time: 12/25/2014 1:30 PM Staffing Anesthesiologist: Gunnar Fusi Resident/CRNA: Jonna [name] Performed by: anesthesiologist  Preanesthetic Checklist Completed: patient identified, site marked, surgical consent, pre-op evaluation, timeout performed, IV checked, risks and benefits discussed and monitors and equipment checked Spinal Block Patient position: sitting Prep: Betadine Patient monitoring: heart rate, continuous pulse ox and blood pressure Approach: midline Location: L3-4 Injection technique: single-shot Needle Needle type: Whitacre  Needle gauge: 25 G Needle length: 9 cm Assessment Sensory level: T8

## 2014-12-25 NOTE — Transfer of Care (Signed)
Immediate Anesthesia Transfer of Care Note  Patient: Leslie [name]  Procedure(s) Performed: Procedure(s): TOTAL KNEE ARTHROPLASTY (Left)  Patient Location: PACU  Anesthesia Type:Spinal  Level of Consciousness: awake, alert  and oriented  Airway & Oxygen Therapy: Patient Spontanous Breathing and Patient connected to nasal cannula oxygen  Post-op Assessment: Report given to RN and Post -op Vital signs reviewed and stable  Post vital signs: Reviewed and stable  Last Vitals:  Filed Vitals:   12/25/14 1150 12/25/14 1627  BP: 154/83 158/99  Pulse: 93 117  Temp: 36.4 C 36.2 C  Resp: 16 14    Complications: No apparent anesthesia complications

## 2014-12-26 ENCOUNTER — Encounter: Payer: Self-pay | Admitting: Orthopedic Surgery

## 2014-12-26 LAB — TYPE AND SCREEN
ABO/RH(D): A POS
Antibody Screen: NEGATIVE
Unit division: 0
Unit division: 0

## 2014-12-26 LAB — CBC
HCT: 34.9 % — ABNORMAL LOW (ref 35.0–47.0)
Hemoglobin: 11.3 g/dL — ABNORMAL LOW (ref 12.0–16.0)
MCH: 29.4 pg (ref 26.0–34.0)
MCHC: 32.4 g/dL (ref 32.0–36.0)
MCV: 90.7 fL (ref 80.0–100.0)
Platelets: 269 10*3/uL (ref 150–440)
RBC: 3.85 MIL/uL (ref 3.80–5.20)
RDW: 13.5 % (ref 11.5–14.5)
WBC: 11.3 10*3/uL — ABNORMAL HIGH (ref 3.6–11.0)

## 2014-12-26 LAB — BASIC METABOLIC PANEL
Anion gap: 8 (ref 5–15)
BUN: 17 mg/dL (ref 6–20)
CO2: 26 mmol/L (ref 22–32)
Calcium: 9 mg/dL (ref 8.9–10.3)
Chloride: 100 mmol/L — ABNORMAL LOW (ref 101–111)
Creatinine, Ser: 0.97 mg/dL (ref 0.44–1.00)
GFR calc Af Amer: >60 mL/min (ref >60)
GFR calc non Af Amer: >60 mL/min (ref >60)
Glucose, Bld: 136 mg/dL — ABNORMAL HIGH (ref 65–99)
Potassium: 3.6 mmol/L (ref 3.5–5.1)
Sodium: 134 mmol/L — ABNORMAL LOW (ref 135–145)

## 2014-12-26 MED ORDER — HYDROCODONE-ACETAMINOPHEN 7.5-325 MG PO TABS
1.0000 | ORAL_TABLET | Freq: Four times a day (QID) | ORAL | Status: DC | PRN
  Administered 2014-12-26 (×2): 1 via ORAL
  Filled 2014-12-26 (×2): qty 1

## 2014-12-26 MED ORDER — NALOXONE HCL 0.4 MG/ML IJ SOLN: 0.4000 mg | INTRAMUSCULAR | Status: DC | PRN

## 2014-12-26 MED ORDER — KETOROLAC TROMETHAMINE 15 MG/ML IJ SOLN
15.0000 mg | Freq: Four times a day (QID) | INTRAMUSCULAR | Status: AC | PRN
  Administered 2014-12-26 (×2): 15 mg via INTRAVENOUS
  Filled 2014-12-26 (×2): qty 1

## 2014-12-26 MED ORDER — HYDROCODONE-ACETAMINOPHEN 5-325 MG PO TABS
2.0000 | ORAL_TABLET | ORAL | Status: DC | PRN
  Administered 2014-12-26 – 2014-12-28 (×6): 2 via ORAL
  Filled 2014-12-26 (×6): qty 2

## 2014-12-26 MED ORDER — PROMETHAZINE HCL 25 MG/ML IJ SOLN: 12.5000 mg | Freq: Four times a day (QID) | INTRAMUSCULAR | Status: DC | PRN

## 2014-12-26 NOTE — Anesthesia Postprocedure Evaluation (Signed)
Anesthesia Post Note  Patient: Leslie [name]  Procedure(s) Performed: Procedure(s) (LRB): TOTAL KNEE ARTHROPLASTY (Left)  Patient location during evaluation: Nursing Unit Anesthesia Type: Spinal Level of consciousness: awake and alert, oriented and patient cooperative Pain management: pain level controlled Vital Signs Assessment: post-procedure vital signs reviewed and stable Respiratory status: spontaneous breathing and nonlabored ventilation Cardiovascular status: blood pressure returned to baseline Postop Assessment: no headache and no backache (SAB resolved) Anesthetic complications: no    Last Vitals:  Filed Vitals:   12/26/14 0038 12/26/14 0441  BP: 158/76 134/75  Pulse: 114 111  Temp: 37.2 C 37 C  Resp: 18 18    Last Pain:  Filed Vitals:   12/26/14 0611  PainSc: 9                  Bernardo Heater

## 2014-12-26 NOTE — Care Management Note (Addendum)
Case Management Note  Patient Details  Name: Leslie [name] MRN: [id number] Date of Birth: March 02, 1965  Subjective/Objective:                  Met with patient to discuss discharge planning. She plans to return home with her husband and states her sister will also assist her in the home. She requests a rolling walker (RX needed). She is not sure about bedside commode and will rely on PT evaluation. She has not preference for home health PT agencies. She uses [facility] for Rx [phone] [id number].  Action/Plan:  Lovenox 45m #28 called in to [facility] for price. List of home health agencies left with patient for review. Rolling walker requested from Will with [facility] RN[facility] will continue to follow.   Expected Discharge Date:                  Expected Discharge Plan:     In-House Referral:     Discharge planning Services  CM Consult  Post Acute Care Choice:  Durable Medical Equipment, Home Health Choice offered to:  Patient  DME Arranged:  Walker rolling [facility]ME [facility]     Status of Service:  In process, will continue to follow  Medicare Important Message Given:    Date Medicare IM Given:    Medicare IM give by:    Date Additional Medicare IM Given:    Additional Medicare Important Message give by:     If discussed at [place] Meetings, dates discussed:    Additional Comments: Rolling walker has been delivered to this room by Will with [facility]. Lovenox $5.00. I have text Dr. KMack Guisefor Rx for walker.   AMarshell Garfinkel RN 12/26/2014, 11:26 AM

## 2014-12-26 NOTE — Progress Notes (Signed)
rn spoke with dr Mack Guise re: pain control and possibility of over sedation r/t narcs. md to order prn narcan and toradol,adjust norco,add phenergan prn

## 2014-12-26 NOTE — Evaluation (Signed)
Occupational Therapy Evaluation Patient Details Name: Leslie [name] MRN: [id number] DOB: 1965-03-26 Today's Date: 12/26/2014    History of Present Illness This patient is a 49 year old female who came to [facility] for a left TKR   Clinical Impression   This patient is a 49  year old female who came [facility] for a L total knee replacement.  Patient lives in a one story home with her husband.  She had been independent with ADL and functional mobility. She now requires assistance and would benefit from Occupational Therapy for ADL/functioal mobility training.       Follow Up Recommendations       Equipment Recommendations       Recommendations for Other Services       Precautions / Restrictions Restrictions Weight Bearing Restrictions: Yes LLE Weight Bearing: Partial weight bearing      Mobility Bed Mobility                  Transfers                      Balance                                            ADL                                         General ADL Comments: Had been independent working for [employer] Patient too painful to practice with assistive devices for lower body dressing so taught patient and husband how they work and given written list of vendors.     Vision     Perception     Praxis      Pertinent Vitals/Pain Pain Assessment:  (very high, RN gave meds during session)     Hand Dominance     Extremity/Trunk Assessment Upper Extremity Assessment Upper Extremity Assessment: Overall WFL for tasks assessed   Lower Extremity Assessment Lower Extremity Assessment: Defer to PT evaluation       Communication     Cognition Arousal/Alertness: Awake/alert (very painful) Behavior During Therapy: Restless Overall Cognitive Status: Within Functional Limits for tasks assessed                     General Comments       Exercises       Shoulder Instructions       Home Living Family/patient expects to be discharged to:: Private residence Living Arrangements: Spouse/significant other Available Help at Discharge: Family Type of Home: House       Home Layout: One level     Bathroom Shower/Tub: Walk-in shower                    Prior Functioning/Environment               OT Diagnosis: Acute pain   OT Problem List: Decreased range of motion;Impaired balance (sitting and/or standing);Decreased knowledge of use of DME or AE;Pain   OT Treatment/Interventions: Self-care/ADL training    OT Goals(Current goals can be found in the care plan section) Acute Rehab OT Goals Patient Stated Goal: to go home OT Goal Formulation: With patient/family Time For Goal Achievement: 01/09/15 Potential to Achieve Goals: Good  OT Frequency: Min 1X/week   Barriers to D/C:            Co-evaluation              End of Session Equipment Utilized During Treatment:  (hip kit) CPM Left Knee CPM Left Knee: Off  Activity Tolerance: Patient limited by pain Patient left: in bed;with call bell/phone within reach;with bed alarm set;with family/visitor present   Time: 1130-1145 OT Time Calculation (min): 15 min Charges:  OT General Charges $OT Visit: 1 Procedure OT Evaluation $Initial OT Evaluation Tier I: 1 Procedure G-Codes:    Leslie Galas, MS/OTR/L  12/26/2014, 11:59 AM

## 2014-12-26 NOTE — Evaluation (Signed)
Physical Therapy Evaluation Patient Details Name: Leslie [name] MRN: [id number] DOB: 07-03-1965 Today's Date: 12/26/2014   History of Present Illness  This patient is a 49 year old female who came to [facility] for a left TKR  Clinical Impression  Patient demonstrates positive attitude and desire to return home. At this time she is rather limited by pain, though she is able to ambulate to the door and back. She requires prolonged time to ambulate to door secondary to step-to pattern and decreased cadence secondary to lethargy/pain. Patient does not demonstrate loss of balance, though several instances of poor walker placement. Her HR remains elevated 110s-120s likely due to pain levels. Patient appears to be progressing towards mobility goals, though limited by pain at this time. Patient would continue to benefit from skilled PT services to address her mobility deficits.     Follow Up Recommendations Home health PT    Equipment Recommendations  Rolling walker with 5" wheels    Recommendations for Other Services       Precautions / Restrictions Precautions Precautions: Fall Restrictions Weight Bearing Restrictions: Yes LLE Weight Bearing: Partial weight bearing      Mobility  Bed Mobility Overal bed mobility: Needs Assistance Bed Mobility: Supine to Sit     Supine to sit: Min assist;Mod assist     General bed mobility comments: Patient requires min-mod A x1 to bring LLE off the edge of the bed secondary to pain and weakness. Patient requires minimal assistance to elevate her trunk.   Transfers Overall transfer level: Needs assistance Equipment used: Rolling walker (2 wheeled) Transfers: Sit to/from Stand Sit to Stand: Min assist         General transfer comment: Patient educated on hand placement and technique (bringing her shoulders anteriorly), she favors her RLE and puts minimal weight through LLE. Knee immobilizer in place.   Ambulation/Gait Ambulation/Gait assistance:  Min guard Ambulation Distance (Feet): 30 Feet Assistive device: Rolling walker (2 wheeled) Gait Pattern/deviations: Step-to pattern;Decreased step length - left;Decreased stance time - left;Decreased step length - right;Antalgic   Gait velocity interpretation: Below normal speed for age/gender General Gait Details: Patient educated to bring LLE forward first, then put half her body weight through UEs to advance RLE. Patient demonstrates step to pattern, with slow stepping secondary to high levels of pain.   Stairs            Wheelchair Mobility    Modified Rankin (Stroke Patients Only)       Balance Overall balance assessment: Needs assistance Sitting-balance support: Bilateral upper extremity supported Sitting balance-Leahy Scale: Fair Sitting balance - Comments: Requires bilateral UE assistance to maintain sitting balance.    Standing balance support: Bilateral upper extremity supported Standing balance-Leahy Scale: Fair Standing balance comment: No loss of balance demonstrated during ambulation, though cuing for technique with RW as she pushes anteriorly at times.                              Pertinent Vitals/Pain Pain Assessment: Faces Faces Pain Scale: Hurts even more Pain Location: L knee  Pain Descriptors / Indicators: Aching Pain Intervention(s): Monitored during session;Limited activity within patient's tolerance;Premedicated before session;Patient requesting pain meds-RN notified    Home Living Family/patient expects to be discharged to:: Private residence Living Arrangements: Spouse/significant other Available Help at Discharge: Family Type of Home: House Home Access: Stairs to enter Entrance Stairs-Rails: Can reach both Entrance Stairs-Number of Steps: 3 Home Layout: One  level        Prior Function Level of Independence: Independent         Comments: Patient has been ambulating without ADs and denies any falls.      Hand Dominance         Extremity/Trunk Assessment   Upper Extremity Assessment: Overall WFL for tasks assessed           Lower Extremity Assessment: LLE deficits/detail   LLE Deficits / Details: Pain induced weakness in LLE, unable to complete 10 SLRs     Communication   Communication: No difficulties  Cognition Arousal/Alertness: Awake/alert (very painful) Behavior During Therapy: Restless Overall Cognitive Status: Within Functional Limits for tasks assessed                      General Comments General comments (skin integrity, edema, etc.): Patient reports TENS unit was not active initially, PT adjusted and re-attached leads.     Exercises Total Joint Exercises Ankle Circles/Pumps: AROM;Both;20 reps Short Arc QuadSinclair Ship;Left;10 reps Heel Slides: AROM;AAROM;Both;10 reps Hip ABduction/ADduction: AROM;AAROM;Both;10 reps Straight Leg Raises: AROM;AAROM;Both;10 reps Goniometric ROM: 10-50 degrees, limited by pain      Assessment/Plan    PT Assessment Patient needs continued PT services  PT Diagnosis Difficulty walking;Generalized weakness   PT Problem List Decreased strength;Decreased knowledge of use of DME;Pain;Decreased range of motion;Decreased activity tolerance;Decreased balance;Decreased mobility  PT Treatment Interventions Gait training;DME instruction;Stair training;Therapeutic activities;Therapeutic exercise;Balance training   PT Goals (Current goals can be found in the Care Plan section) Acute Rehab PT Goals Patient Stated Goal: to go home PT Goal Formulation: With patient Time For Goal Achievement: 01/09/15 Potential to Achieve Goals: Good    Frequency BID   Barriers to discharge        Co-evaluation               End of Session Equipment Utilized During Treatment: Gait belt;Left knee immobilizer Activity Tolerance: Patient tolerated treatment well;Patient limited by pain Patient left: in chair;with call bell/phone within reach;with chair alarm  set Nurse Communication: Mobility status         Time: [id number] PT Time Calculation (min) (ACUTE ONLY): 32 min   Charges:   PT Evaluation $Initial PT Evaluation Tier I: 1 Procedure PT Treatments $Therapeutic Exercise: 8-22 mins   PT G Codes:       Kerman Passey, PT, DPT    12/26/2014, 1:56 PM

## 2014-12-26 NOTE — Progress Notes (Signed)
Physical Therapy Treatment Patient Details Name: Leslie [name] MRN: [id number] DOB: 1965-02-27 Today's Date: 12/26/2014    History of Present Illness This patient is a 49 year old female who came to [facility] for a left TKR    PT Comments    Patient continues to be very limited by pain in both ambulation speed, tolerance, and mechanics. Patient demonstrates increased dependence for assistance with bed mobility and transfers during this session secondary to lethargy and high levels of pain. Patient does not demonstrate any loss of balance once in standing, but requires cuing for technique and continues to demonstrate decreased stance time on LLE. Patient appears motivated to achieve mobility goals, though as noted above is currently most limited by pain levels. Skilled PT services continue to be indicated to address the above deficits.   Follow Up Recommendations  SNF (Given current pain levels, will likely perform better with decreased pain)     Equipment Recommendations  Rolling walker with 5" wheels;3in1 (PT)    Recommendations for Other Services       Precautions / Restrictions Precautions Precautions: Fall Required Braces or Orthoses: Knee Immobilizer - Left Knee Immobilizer - Left: On when out of bed or walking Restrictions Weight Bearing Restrictions: Yes LLE Weight Bearing: Partial weight bearing LLE Partial Weight Bearing Percentage or Pounds: 50%    Mobility  Bed Mobility Overal bed mobility: Needs Assistance Bed Mobility: Sit to Supine     Supine to sit: Min assist;Mod assist Sit to supine: Mod assist;+2 for physical assistance   General bed mobility comments: Patient unable to safely log roll into bed secondary to pain, PT opted instead for assistance with trunk and LEs to return to supine.   Transfers Overall transfer level: Needs assistance Equipment used: Rolling walker (2 wheeled) Transfers: Sit to/from Stand Sit to Stand: Min assist;Mod assist          General transfer comment: Patient educated on sequencing and hand placement, she continues to experience high pain levels with transfers, though no loss of balance. On sitting transfer, patient prefers to let her LLE slide forward to lower herself to surface.   Ambulation/Gait Ambulation/Gait assistance: Min guard Ambulation Distance (Feet): 30 Feet Assistive device: Rolling walker (2 wheeled) Gait Pattern/deviations: Step-to pattern;Decreased step length - right;Decreased step length - left;Antalgic;Decreased stance time - left   Gait velocity interpretation: <1.8 ft/sec, indicative of risk for recurrent falls General Gait Details: Patient continues to take antalgic short steps with RW, requires cuing for sequencing and pushing down through RW to advance RLE. No loss of balance, though very slow gait speed secondary to pain/lethargy.    Stairs            Wheelchair Mobility    Modified Rankin (Stroke Patients Only)       Balance Overall balance assessment: Needs assistance Sitting-balance support: Bilateral upper extremity supported Sitting balance-Leahy Scale: Good Sitting balance - Comments: No loss of balance in this session, no deficits noted aside from use of UEs.    Standing balance support: Bilateral upper extremity supported Standing balance-Leahy Scale: Fair Standing balance comment: Decreased ambulation speed and high pain levels indicate she is at higher risk for falling.                     Cognition Arousal/Alertness: Awake/alert (Somewhat lethargic, likely a result of medications) Behavior During Therapy: WFL for tasks assessed/performed Overall Cognitive Status: Within Functional Limits for tasks assessed  Exercises Total Joint Exercises Ankle Circles/Pumps: AROM;Both;20 reps Short Arc QuadSinclair Ship;Left;10 reps Heel Slides: AROM;AAROM;Both;10 reps Hip ABduction/ADduction: AROM;AAROM;Both;10 reps Straight Leg Raises:  AROM;AAROM;Both;10 reps Goniometric ROM: 10-50 degrees, limited by pain Other Exercises Other Exercises: Assisted patient to and from bedside commode with min-mod A x 1 and RW for transfer with cuing for technique.     General Comments General comments (skin integrity, edema, etc.): PT adjusted TENS unit prior to exiting room.       Pertinent Vitals/Pain Pain Assessment: Faces Faces Pain Scale: Hurts even more Pain Location: L knee Pain Descriptors / Indicators: Aching Pain Intervention(s): Limited activity within patient's tolerance;Monitored during session;Premedicated before session;Patient requesting pain meds-RN notified;Repositioned;Ice applied    Home Living Family/patient expects to be discharged to:: Private residence Living Arrangements: Spouse/significant other Available Help at Discharge: Family Type of Home: House Home Access: Stairs to enter Entrance Stairs-Rails: Can reach both Home Layout: One level        Prior Function Level of Independence: Independent      Comments: Patient has been ambulating without ADs and denies any falls.    PT Goals (current goals can now be found in the care plan section) Acute Rehab PT Goals Patient Stated Goal: to go home PT Goal Formulation: With patient Time For Goal Achievement: 01/09/15 Potential to Achieve Goals: Good Progress towards PT goals: Progressing toward goals    Frequency  BID    PT Plan Discharge plan needs to be updated    Co-evaluation             End of Session Equipment Utilized During Treatment: Gait belt;Left knee immobilizer Activity Tolerance: Patient tolerated treatment well;Patient limited by pain Patient left: in bed;with call bell/phone within reach;with bed alarm set;with family/visitor present     Time: [id number] PT Time Calculation (min) (ACUTE ONLY): 29 min  Charges:  $Gait Training: 8-22 mins $Therapeutic Exercise: 8-22 mins $Therapeutic Activity: 8-22 mins                     G Codes:      Kerman Passey, PT, DPT    12/26/2014, 3:50 PM

## 2014-12-26 NOTE — Progress Notes (Signed)
Clinical Social Worker (CSW) received SNF consult. PT is recommending home health. RN Case Manager is aware of above. Please reconsult if future social work needs arise. CSW signing off.   Jaelee Laughter Morgan, LCSWA [phone] 

## 2014-12-26 NOTE — Progress Notes (Signed)
Subjective:  POD #1  S/P Left TKA.  Complains of left knee pain. She is experience nausea and vomiting this morning. Patient also has had pruritus. Patient is allergic to morphine and I believe has sensitivity to most pain medications. Patient reports pain as moderate.  Her husband and RN, Dava, are at bedside.  Objective:   VITALS:   Filed Vitals:   12/25/14 2246 12/26/14 0038 12/26/14 0441 12/26/14 0911  BP: 155/81 158/76 134/75 142/85  Pulse: 112 114 111 116  Temp: 98.3 F (36.8 C) 98.9 F (37.2 C) 98.6 F (37 C) 98.6 F (37 C)  TempSrc: Oral Oral Oral Oral  Resp: 18 18 18 18   Height:      Weight:      SpO2: 97% 98% 97% 92%    PHYSICAL EXAM:  Left Knee:  Dressing remains clean dry and intact. I personally removed the Hemovac drain from the patient's knee today. Patient has intact sensation light touch in palpable pedal pulses. She dorsiflex plantarflex her ankle and flex and extend her toes. Patient has knee immobilizer in place which was loosened to view the dressing.   LABS  Results for orders placed or performed during the hospital encounter of 12/25/14 (from the past 24 hour(s))  CBC     Status: Abnormal   Collection Time: 12/26/14  8:51 AM  Result Value Ref Range   WBC 11.3 (H) 3.6 - 11.0 K/uL   RBC 3.85 3.80 - 5.20 MIL/uL   Hemoglobin 11.3 (L) 12.0 - 16.0 g/dL   HCT 34.9 (L) 35.0 - 47.0 %   MCV 90.7 80.0 - 100.0 fL   MCH 29.4 26.0 - 34.0 pg   MCHC 32.4 32.0 - 36.0 g/dL   RDW 13.5 11.5 - 14.5 %   Platelets 269 150 - 440 K/uL  Basic metabolic panel     Status: Abnormal   Collection Time: 12/26/14  8:51 AM  Result Value Ref Range   Sodium 134 (L) 135 - 145 mmol/L   Potassium 3.6 3.5 - 5.1 mmol/L   Chloride 100 (L) 101 - 111 mmol/L   CO2 26 22 - 32 mmol/L   Glucose, Bld 136 (H) 65 - 99 mg/dL   BUN 17 6 - 20 mg/dL   Creatinine, Ser 0.97 0.44 - 1.00 mg/dL   Calcium 9.0 8.9 - 10.3 mg/dL   GFR calc non Af Amer >60 >60 mL/min   GFR calc Af Amer >60 >60 mL/min    Anion gap 8 5 - 15    Dg Knee Left Port  12/25/2014  CLINICAL DATA:  Post left knee replacement EXAM: PORTABLE LEFT KNEE - 1-2 VIEW COMPARISON:  02/22/2012 FINDINGS: Two portable views of the left knee submitted. There is left knee prosthesis with anatomic alignment. Postsurgical changes are noted with midline skin staples. Postsurgical drain in place. IMPRESSION: Left knee prosthesis with anatomic alignment. Postsurgical changes are noted. Electronically Signed   By: Lahoma Crocker M.D.   On: 12/25/2014 17:02    Assessment/Plan: 1 Day Post-Op   Active Problems:   S/P total knee replacement using cement  Patient's Foley catheter has been removed. She will complete 24 hours of postop antibiotics. Hemovac drain has also been removed. Patient is about bed to a chair. Patient received Benadryl for her pruritus. She has received Reglan for nausea and vomiting. I have recommended she also received Zofran now.  Patient will continue physical occupational therapy. She should continue to use incentive spirometry while awake.  Thornton Park , MD 12/26/2014, 12:16 PM

## 2014-12-27 NOTE — Progress Notes (Signed)
  Subjective:  Patient's postoperative day #2 status post left total knee arthroplasty. Patient states her pain is much improved. She denies any shortness of breath, chest pain or abdominal pain. She denies any nausea vomiting. Patient's pruritus has resolved. Patient reports pain as mild.  She is sitting in a chair.  Objective:   VITALS:   Filed Vitals:   12/27/14 0422 12/27/14 0718 12/27/14 0732 12/27/14 1057  BP: 136/76 140/74    Pulse: 104 107 105 122  Temp: 99.3 F (37.4 C) 98.3 F (36.8 C)    TempSrc: Oral Oral    Resp: 18 18    Height:      Weight:      SpO2: 98% 80% 100% 95%    PHYSICAL EXAM:  Left lower extremity: Patient's dressing is clean dry and intact. Her leg compartments are soft and compressible. She has palpable pedal pulses. Patient flex and extend her toes and dorsiflex and plantarflex her ankle with 5 strength. She is intact sensation light touch throughout the left lower extremity.    LABS  No results found for this or any previous visit (from the past 24 hour(s)).  Dg Knee Left Port  12/25/2014  CLINICAL DATA:  Post left knee replacement EXAM: PORTABLE LEFT KNEE - 1-2 VIEW COMPARISON:  02/22/2012 FINDINGS: Two portable views of the left knee submitted. There is left knee prosthesis with anatomic alignment. Postsurgical changes are noted with midline skin staples. Postsurgical drain in place. IMPRESSION: Left knee prosthesis with anatomic alignment. Postsurgical changes are noted. Electronically Signed   By: Lahoma Crocker M.D.   On: 12/25/2014 17:02    Assessment/Plan: 2 Days Post-Op   Active Problems:   S/P total knee replacement using cement  Patient is making good progress postoperatively. She will continue on her Lovenox. She was encouraged to continue using her incentive spirometry. She will continue on Norco for pain control. She will continue physical and occupational therapy. A rolling walker has been ordered. She will likely be ready for discharge  home tomorrow.    Thornton Park , MD 12/27/2014, 11:24 AM

## 2014-12-27 NOTE — Progress Notes (Signed)
Physical Therapy Treatment Patient Details Name: Leslie [name] MRN: [id number] DOB: 09-22-1965 Today's Date: 12/27/2014    History of Present Illness This patient is a 49 year old female who came to [facility] for a left TKR    PT Comments    Patient is in significantly less pain during this session allowing her to more fully participate in PT session. Patient tolerates ambulation and knee flexion much better than yesterday and demonstrates significant improvement in gait speed, mechanics, and distance ambulated. Patient continues to demonstrate gait speed and mobility tolerance well below her baseline, though no loss of balance noted during ambulation. Patient progressing well towards established mobility goals.   Follow Up Recommendations  Home health PT     Equipment Recommendations  Rolling walker with 5" wheels;3in1 (PT)    Recommendations for Other Services       Precautions / Restrictions Precautions Precautions: Fall Required Braces or Orthoses: Knee Immobilizer - Left Knee Immobilizer - Left: On when out of bed or walking Restrictions Weight Bearing Restrictions: Yes LLE Weight Bearing: Partial weight bearing LLE Partial Weight Bearing Percentage or Pounds: 50%    Mobility  Bed Mobility               General bed mobility comments: Pt received on bedside commode.   Transfers Overall transfer level: Needs assistance Equipment used: Rolling walker (2 wheeled) Transfers: Sit to/from Stand Sit to Stand: Min guard         General transfer comment: Patient requires no physical assistance, cuing for sequencing. No loss of balance noted.   Ambulation/Gait Ambulation/Gait assistance: Supervision;Min guard Ambulation Distance (Feet): 110 Feet Assistive device: Rolling walker (2 wheeled) Gait Pattern/deviations: Step-to pattern;Decreased stance time - left;Decreased step length - right;Decreased step length - left   Gait velocity interpretation: <1.8 ft/sec,  indicative of risk for recurrent falls General Gait Details: Patient demonstrates increased gait speed compared to yesterday. Requires cuign to use RW to advance RLE, mild ER of LLE noted during ambulation with KI in place. With cuing patient is able to take several step through steps to finish session.   Stairs            Wheelchair Mobility    Modified Rankin (Stroke Patients Only)       Balance Overall balance assessment: Needs assistance Sitting-balance support: Bilateral upper extremity supported Sitting balance-Leahy Scale: Good Sitting balance - Comments: No balance deficits noted in sitting.    Standing balance support: Bilateral upper extremity supported Standing balance-Leahy Scale: Fair Standing balance comment: Decreased gait speed, however no buckling or loss of balance during ambulation.                     Cognition Arousal/Alertness: Awake/alert Behavior During Therapy: WFL for tasks assessed/performed Overall Cognitive Status: Within Functional Limits for tasks assessed                      Exercises Total Joint Exercises Knee Flexion: AAROM;10 reps;Left Goniometric ROM: 10-68    General Comments General comments (skin integrity, edema, etc.): PT adjusted TENS unit.       Pertinent Vitals/Pain Pain Assessment:  (Patient reports pain is very mild compared to yesterday) Pain Location: L knee Pain Descriptors / Indicators: Aching Pain Intervention(s): Limited activity within patient's tolerance;Monitored during session;Premedicated before session;Ice applied    Home Living  Prior Function            PT Goals (current goals can now be found in the care plan section) Acute Rehab PT Goals Patient Stated Goal: to go home PT Goal Formulation: With patient Time For Goal Achievement: 01/09/15 Potential to Achieve Goals: Good Progress towards PT goals: Progressing toward goals    Frequency  BID     PT Plan Discharge plan needs to be updated    Co-evaluation             End of Session Equipment Utilized During Treatment: Gait belt;Left knee immobilizer Activity Tolerance: Patient tolerated treatment well Patient left: in chair;with call bell/phone within reach;with chair alarm set;with family/visitor present     Time: [id number] PT Time Calculation (min) (ACUTE ONLY): 26 min  Charges:  $Gait Training: 23-37 mins                    G Codes:      Kerman Passey, PT, DPT    12/27/2014, 1:22 PM

## 2014-12-27 NOTE — Progress Notes (Signed)
Physical Therapy Treatment Patient Details Name: Leslie [name] MRN: [id number] DOB: July 21, 1965 Today's Date: 12/27/2014    History of Present Illness This patient is a 49 year old female who came to [facility] for a left TKR    PT Comments    Patient is progressing well towards mobility goals, and demonstrates safe and appropriate technique with transfers and ambulation as well as stair training on this session. Patient has completed barriers towards discharge from mobility perspective pending medical clearance. Patient is now able at end of session on 2nd attempt to complete 10 SLRs without quad lag (has not been up until that point, even on 1st attempt in this session). Patient does complain of L thigh pain at the end of the session, though this appears to be in the muscle belly, she is able to weight bear with no additional complaints. Patient would benefit from additional strengthening and balance activities to increase her independence with mobility.   Follow Up Recommendations  Home health PT     Equipment Recommendations  Rolling walker with 5" wheels;3in1 (PT)    Recommendations for Other Services       Precautions / Restrictions Precautions Precautions: Fall Required Braces or Orthoses: Knee Immobilizer - Left Knee Immobilizer - Left: On when out of bed or walking Restrictions Weight Bearing Restrictions: Yes LLE Weight Bearing: Partial weight bearing LLE Partial Weight Bearing Percentage or Pounds: 50%    Mobility  Bed Mobility               General bed mobility comments: Pt received in chair and opted to return to chair.   Transfers Overall transfer level: Needs assistance Equipment used: Rolling walker (2 wheeled) Transfers: Sit to/from Stand Sit to Stand: Min guard         General transfer comment: Pt requires no physical assistance for transfer, but requires cuing for sequencing and hand placement.   Ambulation/Gait Ambulation/Gait assistance:  Supervision Ambulation Distance (Feet): 200 Feet Assistive device: Rolling walker (2 wheeled) Gait Pattern/deviations: Step-through pattern;Decreased step length - right;Decreased step length - left;Decreased stance time - left;Wide base of support     General Gait Details: Patient ambulates with KI in place and no buckling noted. Encouraged patient to increase gait speed, which improved gait quality. No loss of balance, but gait speed and modified DGI indicate she is at moderate falls risk.    Stairs Stairs: Yes Stairs assistance: +2 safety/equipment Stair Management: Backwards;Step to pattern;With walker Number of Stairs: 4 General stair comments: Patient and husband educated on retro technique to ascend steps backwards, (with bilateral rails for descent). No loss of balance, cuing for hand placement and technique provided.   Wheelchair Mobility    Modified Rankin (Stroke Patients Only)       Balance Overall balance assessment: Needs assistance Sitting-balance support: Bilateral upper extremity supported Sitting balance-Leahy Scale: Good Sitting balance - Comments: No balance deficits noted in sitting.    Standing balance support: Bilateral upper extremity supported Standing balance-Leahy Scale: Fair Standing balance comment: Modified DGI score of 9/12 indicating she is not at high falls risk.                     Cognition Arousal/Alertness: Awake/alert Behavior During Therapy: WFL for tasks assessed/performed Overall Cognitive Status: Within Functional Limits for tasks assessed                      Exercises Total Joint Exercises Straight Leg Raises: AROM;AAROM;Left;20 reps  Other Exercises Other Exercises: Assisted patient to and from bedside commode with cga x1 and RW with no cuing required.     General Comments General comments (skin integrity, edema, etc.): PT adjusted ice pack and TENS unit.       Pertinent Vitals/Pain Pain Assessment:  (Pt  reports L thigh pain after session, she states she has had this throughout the day.) Faces Pain Scale: Hurts little more Pain Location: L thigh  Pain Descriptors / Indicators: Aching;Constant Pain Intervention(s): Limited activity within patient's tolerance;Monitored during session;Ice applied (Re-adjusted ice pad location and turned on TENS unit)    Home Living                      Prior Function            PT Goals (current goals can now be found in the care plan section) Acute Rehab PT Goals Patient Stated Goal: to go home PT Goal Formulation: With patient Time For Goal Achievement: 01/09/15 Potential to Achieve Goals: Good Progress towards PT goals: Progressing toward goals    Frequency  BID    PT Plan Current plan remains appropriate    Co-evaluation             End of Session Equipment Utilized During Treatment: Gait belt;Left knee immobilizer Activity Tolerance: Patient tolerated treatment well Patient left: in chair;with call bell/phone within reach;with chair alarm set;with family/visitor present     Time: [id number] PT Time Calculation (min) (ACUTE ONLY): 30 min  Charges:  $Gait Training: 23-37 mins                    G Codes:      Kerman Passey, PT, DPT    12/27/2014, 4:38 PM

## 2014-12-27 NOTE — Progress Notes (Signed)
Occupational Therapy Treatment Patient Details Name: ALLYSIA INGLES MRN: [id number] DOB: July 11, 1965 Today's Date: 12/27/2014    History of present illness This patient is a 49 year old female who came to [facility] for a left TKR   OT comments  .  Follow Up Recommendations       Equipment Recommendations       Recommendations for Other Services      Precautions / Restrictions Precautions Precautions: Fall Required Braces or Orthoses: Knee Immobilizer - Left Knee Immobilizer - Left: On when out of bed or walking Restrictions Weight Bearing Restrictions: Yes LLE Weight Bearing: Partial weight bearing LLE Partial Weight Bearing Percentage or Pounds: 50%       Mobility Bed Mobility               General bed mobility comments: Pt received on bedside commode.   Transfers            Balance                   ADL                                         General ADL Comments: Patient did not want to dress, but willing to practice. Patient donned and doffed socks and pants (to knees) with 2 verbal cues only.      Vision                     Perception     Praxis      Cognition   Behavior During Therapy: WFL for tasks assessed/performed Overall Cognitive Status: Within Functional Limits for tasks assessed                       Extremity/Trunk Assessment               Exercises    Shoulder Instructions       General Comments      Pertinent Vitals/ Pain       Pain Assessment:  (Reports pain much better today, not rated.) Pain Location: L knee Pain Descriptors / Indicators: Aching Pain Intervention(s): Limited activity within patient's tolerance;Monitored during session;Premedicated before session;Ice applied  Home Living                                          Prior Functioning/Environment              Frequency       Progress Toward Goals  OT Goals(current goals can now be  found in the care plan section)     Acute Rehab OT Goals Patient Stated Goal: to go home  Plan      Co-evaluation                 End of Session Equipment Utilized During Treatment:  (hip kit)   Activity Tolerance     Patient Left     Nurse Communication          Time: 7262-0355 OT Time Calculation (min): 12 min  Charges: OT General Charges $OT Visit: 1 Procedure OT Treatments $Self Care/Home Management : 8-22 mins  Myrene Galas, MS/OTR/L  12/27/2014, 2:46 PM

## 2014-12-28 MED ORDER — HYDROCODONE-ACETAMINOPHEN 5-325 MG PO TABS
2.0000 | ORAL_TABLET | ORAL | Status: DC | PRN
Start: 2014-12-28 — End: 2015-04-09

## 2014-12-28 MED ORDER — ENOXAPARIN SODIUM 40 MG/0.4ML ~~LOC~~ SOLN: 30.0000 mg | SUBCUTANEOUS | Status: DC

## 2014-12-28 NOTE — Care Management Note (Signed)
Case Management Note  Patient Details  Name: Leslie [name] MRN: [id number] Date of Birth: [birth year]  Subjective/Objective:      Discussed discharge planning with Dr Mack Guise who stated that [name] has an appointment next Tuesday with Outpatient PT and will not need any home health provider referrals. [name] already has a rolling walker and a BSC delivered to her by [place].               Action/Plan:   Expected Discharge Date:                  Expected Discharge Plan:     In-House Referral:     Discharge planning Services  CM Consult  Post Acute Care Choice:  Durable Medical Equipment, Home Health Choice offered to:  Patient  DME Arranged:  Walker rolling DME Agency:  [place]:  PT [facility] Agency:     Status of Service:  In process, will continue to follow  Medicare Important Message Given:    Date Medicare IM Given:    Medicare IM give by:    Date Additional Medicare IM Given:    Additional Medicare Important Message give by:     If discussed at [place] of Stay Meetings, dates discussed:    Additional Comments:  Vinia Jemmott A, RN 12/28/2014, 12:50 PM

## 2014-12-28 NOTE — Progress Notes (Signed)
Physical Therapy Treatment Patient Details Name: Leslie [name] MRN: [id number] DOB: 1966-01-01 Today's Date: 12/28/2014    History of Present Illness This patient is a 49 year old female who came to [facility] for a left TKR    PT Comments    Patient continues to demonstrate improvements in gait speed, ROM, transfer independence, and dynamic balance with mobility. Patient has minor loss of balance on stairs, but is able to self correct. Encouraged patient and husband to have daughter posterior to patient while going up stairs to increase safety. Patient has met all barriers to discharge from a mobility perspective once medically appropriate. Patient advancing well towards all mobility goals. Skilled PT services continue to be indicated to address her mobility deficits.   Follow Up Recommendations  Home health PT     Equipment Recommendations  Rolling walker with 5" wheels;3in1 (PT)    Recommendations for Other Services       Precautions / Restrictions Precautions Precautions: Fall Knee Immobilizer - Left:  (If unable to complete 10 SLRs) Restrictions Weight Bearing Restrictions: Yes LLE Weight Bearing: Partial weight bearing LLE Partial Weight Bearing Percentage or Pounds: 50%    Mobility  Bed Mobility               General bed mobility comments: Pt received in chair and opted to return to chair.   Transfers Overall transfer level: Needs assistance Equipment used: Rolling walker (2 wheeled) Transfers: Sit to/from Stand Sit to Stand: Supervision         General transfer comment: Pt requires cuing for safe hand placement. No loss of balance during transfer.   Ambulation/Gait Ambulation/Gait assistance: Supervision Ambulation Distance (Feet): 250 Feet Assistive device: Rolling walker (2 wheeled) Gait Pattern/deviations: Step-through pattern;Decreased step length - right;Decreased stance time - left   Gait velocity interpretation: <1.8 ft/sec, indicative of risk for  recurrent falls General Gait Details: Patient is able to complete 10 SLRs in previous session, thus KI not donned. Patient demonstrates improved gait speed, minimal knee flexion to initiate swing phase with LLE. No buckling or loss of balance noted. Wide base of support opted for comfort reasons.    Stairs Stairs: Yes Stairs assistance: Min guard Stair Management: With walker;Backwards;Step to pattern Number of Stairs: 4 General stair comments: Patient instructed on technique with husband present. Minimal loss of balance noted initially, cued patient and husband to have daughter posterior to her while ascending steps to provide additional balance support. No other loss of balance noted. Appropriate technique demonstrated.   Wheelchair Mobility    Modified Rankin (Stroke Patients Only)       Balance Overall balance assessment: Needs assistance Sitting-balance support: Bilateral upper extremity supported Sitting balance-Leahy Scale: Good Sitting balance - Comments: No balance deficits noted in sitting.    Standing balance support: Bilateral upper extremity supported Standing balance-Leahy Scale: Good Standing balance comment: Gait speed and quality have improved, no deficits noted nor any buckling demonstrated with RW.                    Cognition Arousal/Alertness: Awake/alert Behavior During Therapy: WFL for tasks assessed/performed Overall Cognitive Status: Within Functional Limits for tasks assessed                      Exercises Total Joint Exercises Long Arc Quad: AROM;Both;15 reps Goniometric ROM: 3-75 Other Exercises Other Exercises: Heel slides with pillow cover on floor with overpressure to increase knee flexion x 1 minute for 2  bouts.     General Comments        Pertinent Vitals/Pain Pain Assessment:  (Pt reports her L thigh continues to ache, though much improved throughout this stay) Pain Location: L thigh  Pain Descriptors / Indicators:  Aching Pain Intervention(s): Limited activity within patient's tolerance;Monitored during session;Premedicated before session;Ice applied    Home Living                      Prior Function            PT Goals (current goals can now be found in the care plan section) Acute Rehab PT Goals Patient Stated Goal: to go home PT Goal Formulation: With patient Time For Goal Achievement: 01/09/15 Potential to Achieve Goals: Good Progress towards PT goals: Progressing toward goals    Frequency  BID    PT Plan Current plan remains appropriate    Co-evaluation             End of Session Equipment Utilized During Treatment: Gait belt Activity Tolerance: Patient tolerated treatment well Patient left: in chair;with call bell/phone within reach;with chair alarm set;with family/visitor present     Time: 5597-4163 PT Time Calculation (min) (ACUTE ONLY): 27 min  Charges:  $Gait Training: 23-37 mins                    G Codes:      Kerman Passey, PT, DPT    12/28/2014, 12:33 PM

## 2014-12-28 NOTE — Progress Notes (Signed)
  Subjective:  Postoperative day #3 status post left total knee arthroplasty. Patient reports pain as minimal.  I spoke with the patient's physical therapist who states she is doing well and has navigated stairs safely as well. Patient denies any chest pain, shortness of breath or abdominal pain. Her nurse reports she had a bowel movement this morning. She is sitting up out of bed to a chair with her left leg elevated.  Objective:   VITALS:   Filed Vitals:   12/27/14 1517 12/27/14 1939 12/28/14 0452 12/28/14 0728  BP: 120/56 132/65 121/66 142/67  Pulse: 99 109 85 109  Temp: 98.2 F (36.8 C) 98.7 F (37.1 C) 97.8 F (36.6 C) 97.9 F (36.6 C)  TempSrc: Oral Oral Oral Oral  Resp: 16 20 20 16   Height:      Weight:      SpO2: 97% 100% 100% 98%    PHYSICAL EXAM:  Left lower extremity: I personally changed the patient's dressing today.  Incisions clean dry and intact. There is no active drainage. I placed a new honeycomb dressing over the left knee incision. There is no erythema or ecchymosis or significant swelling. Patient has intact sensation to light touch in palpable pedal pulses. She has intact motor function with 5/5 strength in all muscle groups.   LABS  No results found for this or any previous visit (from the past 24 hour(s)).  No results found.  Assessment/Plan: 3 Days Post-Op   Active Problems:   S/P total knee replacement using cement  Patient doing well postop. She'll be discharged today to home. Patient will receive outpatient physical therapy. She will remain on Lovenox 40 mg subcutaneous daily for DVT prophylaxis. She'll follow-up with me in 10-14 days. She will continue using TENS unit and Polar Care for symptomatically. I encouraged her to continue to elevate the left lower extremity whenever possible. She'll remain partial weightbearing on the left lower extremity for 4 weeks postop. Answered all questions by the patient and her husband was at the bedside today.  They understand and agree with this plan.    Thornton Park , MD 12/28/2014, 12:18 PM

## 2014-12-28 NOTE — Discharge Instructions (Signed)
Continue 50% partial weightbearing on the left lower extremity 1 month postop. Continue to use TED stockings until follow-up. Patient should remove TED docking at night for sleep. Elevate the left lower extremity whenever possible. Continue to use knee immobilizer at night or when lying in bed or when elevating the left leg. The patient may remove the knee immobilizer to perform exercises or sit in a chair. Continue using the TENS unit and Polar Care for comfort. Keep incision clean and dry. Cover the right knee incision during showers with a plastic bag or Saran wrap. Take Lovenox 40 mg subcutaneous daily for blood clot prevention. Continue to work on knee range of motion exercises at home as instructed by physical therapy. Continue to use a walker for assistance with ambulation until follow-up.

## 2014-12-28 NOTE — Discharge Summary (Signed)
Physician Discharge Summary  Patient ID: YOSHINO JUTRAS MRN: [id number] DOB/AGE: [birth year] 49 y.o.  Admit date: 12/25/2014 Discharge date: 12/28/2014  Admission Diagnoses:  OSTEOARTHRITIS LEFT KNEE  Discharge Diagnoses:  OSTEOARTHRITIS LEFT KNEE Active Problems:   S/P total knee replacement using cement   Past Medical History  Diagnosis Date  . Hypertension   . Hyperlipidemia   . Allergy   . Anxiety   . GERD (gastroesophageal reflux disease)     Surgeries: Procedure(s): TOTAL KNEE ARTHROPLASTY on 12/25/2014   Consultants (if any):    Discharged Condition: Improved  Hospital Course: LOREL NOLL is an 49 y.o. female who was admitted 12/25/2014 with a diagnosis of  OSTEOARTHRITIS LEFT KNEE  and went to the operating room on 12/25/2014 and underwent a left total knee arthroplasty.    She was given perioperative antibiotics:  Anti-infectives    Start     Dose/Rate Route Frequency Ordered Stop   12/25/14 1126  ceFAZolin (ANCEF) 2-3 GM-% IVPB SOLR    Comments:  LEWIS, CINDY: cabinet override      12/25/14 1126 12/25/14 2329   12/25/14 1125  clindamycin (CLEOCIN) 600 MG/50ML IVPB    Comments:  LEWIS, CINDY: cabinet override      12/25/14 1125 12/25/14 1337   12/25/14 0200  ceFAZolin (ANCEF) IVPB 2 g/50 mL premix     2 g 100 mL/hr over 30 Minutes Intravenous  Once 12/25/14 0157 12/25/14 1342   12/25/14 0200  clindamycin (CLEOCIN) IVPB 600 mg  Status:  Discontinued     600 mg 100 mL/hr over 30 Minutes Intravenous  Once 12/25/14 0157 12/25/14 1734    . Postoperatively, the patient was admitted to the orthopedic floor. She had a spinal anesthetic and was then transitioned to IV Dilaudid and oxycodone. Patient experienced pruritus with these medications and received Benadryl. Her analgesia medication was changed to Norco 5/325 mg 2 tablets every 4 hours with good pain control. Patient was seen and evaluated by physical occupational therapy beginning on postop day #1. Her Foley  catheter was removed on postop day #1. She had an Autovac drain placed during surgery. The Hemovac was removed on postop day #1. Her lab work remained stable postoperatively. Patient had a bowel movement on postoperative day #3. She made excellent progress with physical therapy and was safe on stairs. Given her clinical improvement she is prepared for discharge home on postop day #3. Patient will be set up for outpatient physical therapy in my office. She will follow up with me in 10-14 days for wound check, staple removal and x-ray.  She was given sequential compression devices, early ambulation, and Lovenox 30 mg subcutaneous twice a day for DVT prophylaxis.  She benefited maximally from the hospital stay and there were no complications.    Recent vital signs:  Filed Vitals:   12/28/14 0452 12/28/14 0728  BP: 121/66 142/67  Pulse: 85 109  Temp: 97.8 F (36.6 C) 97.9 F (36.6 C)  Resp: 20 16    Recent laboratory studies:  Lab Results  Component Value Date   HGB 11.3* 12/26/2014   HGB 13.3 12/11/2014   HGB 14.0 04/05/2012   Lab Results  Component Value Date   WBC 11.3* 12/26/2014   PLT 269 12/26/2014   Lab Results  Component Value Date   INR 1.03 12/11/2014   Lab Results  Component Value Date   NA 134* 12/26/2014   K 3.6 12/26/2014   CL 100* 12/26/2014   CO2 26 12/26/2014  BUN 17 12/26/2014   CREATININE 0.97 12/26/2014   GLUCOSE 136* 12/26/2014    Discharge Medications:     Medication List    STOP taking these medications        ibuprofen 800 MG tablet  Commonly known as:  ADVIL,MOTRIN      TAKE these medications        albuterol 108 (90 BASE) MCG/ACT inhaler  Commonly known as:  PROVENTIL HFA;VENTOLIN HFA  Inhale 2 puffs into the lungs every 6 (six) hours as needed for wheezing or shortness of breath.     ALPRAZolam 0.5 MG tablet  Commonly known as:  XANAX  Take 0.5 mg by mouth 2 (two) times daily as needed for anxiety.     atorvastatin 20 MG tablet   Commonly known as:  LIPITOR  Take 20 mg by mouth daily.     cetirizine 10 MG tablet  Commonly known as:  ZYRTEC  Take 10 mg by mouth at bedtime.     enoxaparin 40 MG/0.4ML injection  Commonly known as:  LOVENOX  Inject 0.3 mLs (30 mg total) into the skin daily.     estradiol 0.5 MG tablet  Commonly known as:  ESTRACE  Take 0.5 mg by mouth daily.     Fish Oil 1000 MG Caps  Take by mouth daily.     fluticasone 50 MCG/ACT nasal spray  Commonly known as:  FLONASE  Place 2 sprays into both nostrils daily as needed for allergies or rhinitis.     hydrochlorothiazide 25 MG tablet  Commonly known as:  HYDRODIURIL  Take 25 mg by mouth daily.     HYDROcodone-acetaminophen 5-325 MG tablet  Commonly known as:  NORCO/VICODIN  Take 2 tablets by mouth every 4 (four) hours as needed for moderate pain.     lansoprazole 30 MG capsule  Commonly known as:  PREVACID  Take 30 mg by mouth daily.     montelukast 10 MG tablet  Commonly known as:  SINGULAIR  Take 10 mg by mouth at bedtime.     multivitamin capsule  Take 1 capsule by mouth daily.        Diagnostic Studies: Dg Chest 2 View  12/20/2014  CLINICAL DATA:  Preop for left knee replacement EXAM: CHEST  2 VIEW COMPARISON:  12/11/2014 FINDINGS: Cardiomediastinal silhouette is stable. No focal infiltrate or pulmonary edema. Stable chronic interstitial prominence. Bony thorax is unremarkable. IMPRESSION: No infiltrate or pulmonary edema. Stable chronic interstitial prominence. Electronically Signed   By: Lahoma Crocker M.D.   On: 12/20/2014 16:20   Dg Chest 2 View  12/11/2014  CLINICAL DATA:  Smoker.  Preoperative exam . EXAM: CHEST  2 VIEW COMPARISON:  10/13/2011 . FINDINGS: Mediastinum and hilar structures are normal. Heart size normal. Bibasilar subsegmental atelectasis and/or mild infiltrates. New from prior exam. No pleural effusion or pneumothorax. IMPRESSION: Bibasilar subsegmental atelectasis and/or infiltrates. Electronically Signed    By: Marcello Moores  Register   On: 12/11/2014 13:22   Dg Knee Left Port  12/25/2014  CLINICAL DATA:  Post left knee replacement EXAM: PORTABLE LEFT KNEE - 1-2 VIEW COMPARISON:  02/22/2012 FINDINGS: Two portable views of the left knee submitted. There is left knee prosthesis with anatomic alignment. Postsurgical changes are noted with midline skin staples. Postsurgical drain in place. IMPRESSION: Left knee prosthesis with anatomic alignment. Postsurgical changes are noted. Electronically Signed   By: Lahoma Crocker M.D.   On: 12/25/2014 17:02    Disposition:       Discharge  Instructions    Call MD / Call 911    Complete by:  As directed   If you experience chest pain or shortness of breath, CALL 911 and be transported to the hospital emergency room.  If you develope a fever above 101 F, pus (white drainage) or increased drainage or redness at the wound, or calf pain, call your surgeon's office.     Constipation Prevention    Complete by:  As directed   Drink plenty of fluids.  Prune juice may be helpful.  You may use a stool softener, such as Colace (over the counter) 100 mg twice a day.  Use MiraLax (over the counter) for constipation as needed.     Diet - low sodium heart healthy    Complete by:  As directed      Discharge instructions    Complete by:  As directed   Continue 50% partial weightbearing on the left lower extremity 1 month postop. Continue to use TED stockings until follow-up. Patient may remove them at night for sleep. Elevate the left lower extremity whenever possible. Continue to use knee immobilizer at night or when lying in bed or when elevating the operative leg. The patient may remove the knee immobilizer to perform exercises or sit in a chair. Continue using the TENS unit and Polar Care for comfort. Keep incision clean and dry. Cover the right knee incision during showers with a plastic bag or Saran wrap. Take Lovenox 40 mg subcutaneous daily for blood clot prevention. Continue to  work on knee range of motion exercises at home as instructed by physical therapy. Continue to use a walker for assistance with ambulation until follow-up.     Do not put a pillow under the knee. Place it under the heel.    Complete by:  As directed      Driving restrictions    Complete by:  As directed   No driving for 4 weeks.  The patient must be off of narcotic pain medication entirely before she may begin driving.     Increase activity slowly as tolerated    Complete by:  As directed      Lifting restrictions    Complete by:  As directed   No lifting for 12 weeks     TED hose    Complete by:  As directed   Use stockings (TED hose) for 2 weeks on both leg(s).  You may remove them at night for sleeping.              Signed: Thornton Park ,MD 12/28/2014, 12:29 PM

## 2014-12-28 NOTE — Progress Notes (Signed)
Patient discharged home. Instructions and prescriptions given to patient, verbalized understanding. Husband providing transportation home. IV removed.

## 2015-01-30 ENCOUNTER — Telehealth: Payer: Self-pay | Admitting: *Deleted

## 2015-01-30 NOTE — Telephone Encounter (Signed)
Patient contacted today since she did not follow up with our office regarding dosage of high dose aspirin after knee surgery that was completed on 12-25-14.   This patient states she is supposed to start a 325 mg aspirin twice a day starting today. Patient has not taken aspirin yet.   Dr. Jamal Collin notified and wants patient to take an 81 mg aspirin once daily instead until after stereo biopsy which is scheduled for Monday, 02-03-15. Patient aware of this and verbalizes understanding.

## 2015-02-03 ENCOUNTER — Ambulatory Visit
Admission: RE | Admit: 2015-02-03 | Discharge: 2015-02-03 | Disposition: A | Discharge status: Home / Self Care | Payer: Managed Care, Other (non HMO) | Source: Ambulatory Visit | Attending: General Surgery | Admitting: General Surgery

## 2015-02-03 DIAGNOSIS — R921 Mammographic calcification found on diagnostic imaging of breast: Secondary | ICD-10-CM | POA: Diagnosis present

## 2015-02-03 DIAGNOSIS — R920 Mammographic microcalcification found on diagnostic imaging of breast: Secondary | ICD-10-CM | POA: Diagnosis not present

## 2015-02-03 HISTORY — PX: BREAST BIOPSY: SHX20

## 2015-02-03 NOTE — Op Note (Addendum)
STEREOTACTIC BREAST BIOPSY REPORT  Name:  GERALDA FRASIER DOB:  [birth year]  Vitals: There were no vitals taken for this visit.  Lesion:  Microcalcification  Location:  Right  1 o'clock position  Local anesthetic:   25 cc's of 0.5% Xylocaine/0.25% Marcaine with 1:200,000 epinephrine and NaH2CO3   Prep:  Chroraprep solution  Finesse biopsy device:  10 gauge  Patient tolerance: Patient tolerated the procedure well   Approach: CC; 12 core samples obtained.  Dressing: Telfa and Tegaderm  Clip: Hourglass  Ice pack applied. Written instructions provided to patient regarding wound care.   Patient advised that patient will be contacted by phone when pathology report is available.  Follow up has been scheduled.    Robert Bellow

## 2015-02-04 ENCOUNTER — Telehealth: Payer: Self-pay | Admitting: *Deleted

## 2015-02-04 LAB — SURGICAL PATHOLOGY

## 2015-02-04 NOTE — Telephone Encounter (Signed)
Dr. Dicie Beam called our office with pathology report right breast stereo biopsy done 02-03-15;benign-fibroadenomatous changes with calcifications. Dr. Bary Castilla was notified of this report and asked that patient be notified. Pt was called and advised of the benign report. She verbalized understanding. Dr. Jamal Collin made aware of pathology report.

## 2015-02-05 ENCOUNTER — Telehealth: Payer: Self-pay | Admitting: *Deleted

## 2015-02-05 NOTE — Telephone Encounter (Signed)
Patient called the office back and was notified as instructed. She verbalizes understanding.   This patient was instructed to keep nurse appointment as scheduled for next week.

## 2015-02-05 NOTE — Telephone Encounter (Signed)
-----   Message from Christene Lye, MD sent at 02/05/2015  9:21 AM EST ----- Biopsy showed a benign finding.  Follow up in Nov 2017 wit bil screening mammogram

## 2015-02-05 NOTE — Telephone Encounter (Signed)
Message for patient to call the office.  

## 2015-02-11 ENCOUNTER — Ambulatory Visit (INDEPENDENT_AMBULATORY_CARE_PROVIDER_SITE_OTHER): Payer: Managed Care, Other (non HMO) | Admitting: *Deleted

## 2015-02-11 DIAGNOSIS — R920 Mammographic microcalcification found on diagnostic imaging of breast: Secondary | ICD-10-CM

## 2015-02-11 NOTE — Progress Notes (Signed)
Patient here today for follow up post right breast stereo.  Dressing removed, steristrip in place and aware it may come off in one week.  Minimal bruising noted.  The patient is aware that a heating pad may be used for comfort as needed.  Aware of pathology. Follow up as scheduled.

## 2015-04-09 ENCOUNTER — Inpatient Hospital Stay: Admission: RE | Admit: 2015-04-09 | Payer: Managed Care, Other (non HMO) | Source: Ambulatory Visit

## 2015-04-09 ENCOUNTER — Other Ambulatory Visit: Payer: Self-pay | Admitting: Orthopedic Surgery

## 2015-04-09 NOTE — Patient Instructions (Addendum)
  Your procedure is scheduled on: 04-15-15 (TUESDAY) Report to [place] To find out your arrival time please call [phone] between 1PM - 3PM on 04-14-15 [facility])  Remember: Instructions that are not followed completely may result in serious medical risk, up to and including death, or upon the discretion of your surgeon and anesthesiologist your surgery may need to be rescheduled.    _X___ 1. Do not eat food or drink liquids after midnight. No gum chewing or hard candies.     _X___ 2. No Alcohol for 24 hours before or after surgery.   ____ 3. Bring all medications with you on the day of surgery if instructed.    _X___ 4. Notify your doctor if there is any change in your medical condition     (cold, fever, infections).     Do not wear jewelry, make-up, hairpins, clips or nail polish.  Do not wear lotions, powders, or perfumes. You may wear deodorant.  Do not shave 48 hours prior to surgery. Men may shave face and neck.  Do not bring valuables to the hospital.    [facility] is not responsible for any belongings or valuables.               Contacts, dentures or bridgework may not be worn into surgery.  Leave your suitcase in the car. After surgery it may be brought to your room.  For patients admitted to the hospital, discharge time is determined by your treatment team.   Patients discharged the day of surgery will not be allowed to drive home.   Please read over the following fact sheets that you were given:      _X___ Take these medicines the morning of surgery with A SIP OF WATER:    1. PREVACID  2. TAKE A PREVACID Monday NIGHT(04-14-15) BEFORE BED  3. MAY TAKE XANAX IF NEEDED DAY OF SURGERY  4.  5.  6.  ____ Fleet Enema (as directed)   ____ Use CHG Soap as directed  _X___ Use inhalers on the day of surgery-USE ALBUTEROL INHALER AT [place]  ____ Stop metformin 2 days prior to surgery    ____ Take 1/2 of usual  insulin dose the night before surgery and none on the morning of surgery.   ____ Stop Coumadin/Plavix/aspirin-N/A  ____ Stop Anti-inflammatories-NO NSAIDS OR ASA PRODUCTS-TYLENOL OK TO TAKE   ____ Stop supplements until after surgery.    ____ Bring C-Pap to the hospital.

## 2015-04-10 ENCOUNTER — Encounter
Admission: RE | Admit: 2015-04-10 | Discharge: 2015-04-10 | Disposition: A | Discharge status: Home / Self Care | Payer: Managed Care, Other (non HMO) | Source: Ambulatory Visit | Attending: Orthopedic Surgery | Admitting: Orthopedic Surgery

## 2015-04-10 DIAGNOSIS — Z01812 Encounter for preprocedural laboratory examination: Secondary | ICD-10-CM | POA: Diagnosis present

## 2015-04-10 LAB — POTASSIUM: Potassium: 3.8 mmol/L (ref 3.5–5.1)

## 2015-04-15 ENCOUNTER — Ambulatory Visit: Payer: Managed Care, Other (non HMO) | Admitting: Anesthesiology

## 2015-04-15 ENCOUNTER — Ambulatory Visit: Payer: Managed Care, Other (non HMO)

## 2015-04-15 ENCOUNTER — Encounter
Admission: RE | Disposition: A | Discharge status: Home / Self Care | Payer: Self-pay | Source: Ambulatory Visit | Attending: Orthopedic Surgery

## 2015-04-15 ENCOUNTER — Encounter: Payer: Self-pay | Admitting: *Deleted

## 2015-04-15 ENCOUNTER — Ambulatory Visit
Admission: RE | Admit: 2015-04-15 | Discharge: 2015-04-15 | Disposition: A | Discharge status: Home / Self Care | Payer: Managed Care, Other (non HMO) | Source: Ambulatory Visit | Attending: Orthopedic Surgery | Admitting: Orthopedic Surgery

## 2015-04-15 DIAGNOSIS — Z79899 Other long term (current) drug therapy: Secondary | ICD-10-CM | POA: Insufficient documentation

## 2015-04-15 DIAGNOSIS — Z7951 Long term (current) use of inhaled steroids: Secondary | ICD-10-CM | POA: Insufficient documentation

## 2015-04-15 DIAGNOSIS — M24662 Ankylosis, left knee: Secondary | ICD-10-CM | POA: Diagnosis present

## 2015-04-15 DIAGNOSIS — Z96652 Presence of left artificial knee joint: Secondary | ICD-10-CM | POA: Insufficient documentation

## 2015-04-15 DIAGNOSIS — Z885 Allergy status to narcotic agent status: Secondary | ICD-10-CM | POA: Insufficient documentation

## 2015-04-15 DIAGNOSIS — K219 Gastro-esophageal reflux disease without esophagitis: Secondary | ICD-10-CM | POA: Insufficient documentation

## 2015-04-15 DIAGNOSIS — Z803 Family history of malignant neoplasm of breast: Secondary | ICD-10-CM | POA: Insufficient documentation

## 2015-04-15 DIAGNOSIS — Z90710 Acquired absence of both cervix and uterus: Secondary | ICD-10-CM | POA: Diagnosis not present

## 2015-04-15 DIAGNOSIS — Z9889 Other specified postprocedural states: Secondary | ICD-10-CM | POA: Insufficient documentation

## 2015-04-15 DIAGNOSIS — Z9109 Other allergy status, other than to drugs and biological substances: Secondary | ICD-10-CM | POA: Diagnosis not present

## 2015-04-15 DIAGNOSIS — I10 Essential (primary) hypertension: Secondary | ICD-10-CM | POA: Diagnosis not present

## 2015-04-15 DIAGNOSIS — E785 Hyperlipidemia, unspecified: Secondary | ICD-10-CM | POA: Diagnosis not present

## 2015-04-15 DIAGNOSIS — F17200 Nicotine dependence, unspecified, uncomplicated: Secondary | ICD-10-CM | POA: Diagnosis not present

## 2015-04-15 HISTORY — PX: KNEE CLOSED REDUCTION: SHX995

## 2015-04-15 SURGERY — MANIPULATION, KNEE, CLOSED
Anesthesia: General | Site: Knee | Laterality: Left | Wound class: Clean

## 2015-04-15 MED ORDER — ACETAMINOPHEN 10 MG/ML IV SOLN
INTRAVENOUS | Status: AC
  Filled 2015-04-15: qty 100

## 2015-04-15 MED ORDER — FENTANYL CITRATE (PF) 100 MCG/2ML IJ SOLN
INTRAMUSCULAR | Status: AC
  Administered 2015-04-15: 25 ug via INTRAVENOUS
  Filled 2015-04-15: qty 2

## 2015-04-15 MED ORDER — LACTATED RINGERS IV SOLN
INTRAVENOUS | Status: DC
  Administered 2015-04-15: 10:00:00 via INTRAVENOUS

## 2015-04-15 MED ORDER — ACETAMINOPHEN 10 MG/ML IV SOLN
INTRAVENOUS | Status: DC | PRN
  Administered 2015-04-15: 1000 mg via INTRAVENOUS

## 2015-04-15 MED ORDER — MIDAZOLAM HCL 5 MG/5ML IJ SOLN
INTRAMUSCULAR | Status: DC | PRN
  Administered 2015-04-15: 2 mg via INTRAVENOUS

## 2015-04-15 MED ORDER — ONDANSETRON HCL 4 MG/2ML IJ SOLN
INTRAMUSCULAR | Status: DC | PRN
  Administered 2015-04-15: 4 mg via INTRAVENOUS

## 2015-04-15 MED ORDER — KETOROLAC TROMETHAMINE 30 MG/ML IJ SOLN
INTRAMUSCULAR | Status: DC | PRN
  Administered 2015-04-15: 30 mg via INTRAVENOUS

## 2015-04-15 MED ORDER — HYDROCODONE-ACETAMINOPHEN 5-325 MG PO TABS
1.0000 | ORAL_TABLET | ORAL | Status: AC | PRN
  Administered 2015-04-15: 1 via ORAL

## 2015-04-15 MED ORDER — GLYCOPYRROLATE 0.2 MG/ML IJ SOLN
INTRAMUSCULAR | Status: DC | PRN
  Administered 2015-04-15: 0.2 mg via INTRAVENOUS

## 2015-04-15 MED ORDER — HYDROCORTISONE NA SUCCINATE PF 100 MG IJ SOLR
INTRAMUSCULAR | Status: DC | PRN
  Administered 2015-04-15: 100 mg via INTRAVENOUS

## 2015-04-15 MED ORDER — HYDROCODONE-ACETAMINOPHEN 5-325 MG PO TABS: 1.0000 | ORAL_TABLET | ORAL | Status: DC | PRN

## 2015-04-15 MED ORDER — LIDOCAINE HCL (PF) 2 % IJ SOLN
INTRAMUSCULAR | Status: DC | PRN
  Administered 2015-04-15: 50 mg

## 2015-04-15 MED ORDER — HYDROCODONE-ACETAMINOPHEN 5-325 MG PO TABS
ORAL_TABLET | ORAL | Status: AC
  Administered 2015-04-15: 1 via ORAL
  Filled 2015-04-15: qty 1

## 2015-04-15 MED ORDER — PROPOFOL 10 MG/ML IV BOLUS
INTRAVENOUS | Status: DC | PRN
  Administered 2015-04-15: 150 mg via INTRAVENOUS
  Administered 2015-04-15: 50 mg via INTRAVENOUS

## 2015-04-15 MED ORDER — FENTANYL CITRATE (PF) 100 MCG/2ML IJ SOLN
25.0000 ug | INTRAMUSCULAR | Status: AC | PRN
  Administered 2015-04-15 (×6): 25 ug via INTRAVENOUS

## 2015-04-15 SURGICAL SUPPLY — 3 items
KIT RM TURNOVER STRD PROC AR (KITS) ×2 IMPLANT
PAD WRAPON POLAR KNEE (MISCELLANEOUS) ×1 IMPLANT
WRAPON POLAR PAD KNEE (MISCELLANEOUS) ×2

## 2015-04-15 NOTE — Discharge Instructions (Signed)

## 2015-04-15 NOTE — Anesthesia Procedure Notes (Signed)
Procedure Name: LMA Insertion Performed by: Laurence Folz Pre-anesthesia Checklist: Patient identified, Patient being monitored, Timeout performed, Emergency Drugs available and Suction available Patient Re-evaluated:Patient Re-evaluated prior to inductionOxygen Delivery Method: Circle system utilized Preoxygenation: Pre-oxygenation with 100% oxygen Intubation Type: IV induction Ventilation: Mask ventilation without difficulty LMA: LMA inserted LMA Size: 3.5 Tube type: Oral Number of attempts: 1 Placement Confirmation: positive ETCO2 and breath sounds checked- equal and bilateral Tube secured with: Tape Dental Injury: Teeth and Oropharynx as per pre-operative assessment        

## 2015-04-15 NOTE — Anesthesia Postprocedure Evaluation (Signed)
Anesthesia Post Note  Patient: Leslie [name]  Procedure(s) Performed: Procedure(s) (LRB): CLOSED MANIPULATION KNEE (Left)  Patient location during evaluation: PACU Anesthesia Type: General Level of consciousness: awake and alert Pain management: pain level controlled Vital Signs Assessment: post-procedure vital signs reviewed and stable Respiratory status: spontaneous breathing, nonlabored ventilation, respiratory function stable and patient connected to nasal cannula oxygen Cardiovascular status: blood pressure returned to baseline and stable Postop Assessment: no signs of nausea or vomiting Anesthetic complications: no    Last Vitals:  Filed Vitals:   04/15/15 1151 04/15/15 1205  BP: 130/74 136/79  Pulse: 93 100  Temp: 36.4 C 35.8 C  Resp: 14 16    Last Pain:  Filed Vitals:   04/15/15 1207  PainSc: [name]

## 2015-04-15 NOTE — H&P (Signed)
PREOPERATIVE H&P  Chief Complaint: M24.60 Ankylosis, unspecified joint  HPI: Leslie [name] is a 50 y.o. female who presents for preoperative history and physical with a diagnosis of M24.60 Ankylosis, unspecified joint. Patient has developed stiffness of left knee following left total knee arthroplasty. She has had significant pain postop which I believe is the causative agent of the stiffness. Slight difficulty performing physical therapy due to pain.  Her left knee joint stiffness is significantly impairing activities of daily living.  She has elected for closed manipulation.   Past Medical History  Diagnosis Date  . Hypertension   . Hyperlipidemia   . Allergy   . Anxiety   . GERD (gastroesophageal reflux disease)    Past Surgical History  Procedure Laterality Date  . Abdominal hysterectomy    . Knee surgery    . Abdominal hysterectomy    . Tubal ligation    . Total knee arthroplasty Left 12/25/2014    Procedure: TOTAL KNEE ARTHROPLASTY;  Surgeon: Thornton Park, MD;  Location: [facility] ORS;  Service: Orthopedics;  Laterality: Left;  . Breast biopsy Bilateral 2012  . Breast biopsy Right 08-07-12    fibroadenomatous changes and columnar cells  . Breast biopsy  02/03/2015    stereo byrnett   Social History   Social History  . Marital Status: Married    Spouse Name: N/A  . Number of Children: N/A  . Years of Education: N/A   Social History Main Topics  . Smoking status: Current Every Day Smoker    Types: Cigarettes  . Smokeless tobacco: Never Used  . Alcohol Use: 1.2 oz/week    2 Glasses of wine per week  . Drug Use: No  . Sexual Activity: Not Asked   Other Topics Concern  . None   Social History Narrative   Family History  Problem Relation Age of Onset  . Cancer Mother 77    breast  . Cancer Maternal Aunt     breast  . Cancer Maternal Grandmother     breast   Allergies  Allergen Reactions  . Oxycodone Hcl Itching  . Dilaudid [Hydromorphone Hcl] Itching  .  Morphine And Related Hives   Prior to Admission medications   Medication Sig Start Date End Date Taking? Authorizing Provider  albuterol (PROVENTIL HFA;VENTOLIN HFA) 108 (90 BASE) MCG/ACT inhaler Inhale 2 puffs into the lungs every 6 (six) hours as needed for wheezing or shortness of breath.   Yes Historical Provider, MD  ALPRAZolam Duanne Moron) 0.5 MG tablet Take 0.5 mg by mouth 2 (two) times daily as needed for anxiety.   Yes Historical Provider, MD  atorvastatin (LIPITOR) 20 MG tablet Take 20 mg by mouth daily at 6 PM.  07/14/12  Yes Historical Provider, MD  cetirizine (ZYRTEC) 10 MG tablet Take 10 mg by mouth at bedtime.   Yes Historical Provider, MD  estradiol (ESTRACE) 0.5 MG tablet Take 0.5 mg by mouth daily.   Yes Historical Provider, MD  fluticasone (FLONASE) 50 MCG/ACT nasal spray Place 2 sprays into both nostrils daily as needed for allergies or rhinitis.   Yes Historical Provider, MD  hydrochlorothiazide (HYDRODIURIL) 25 MG tablet Take 25 mg by mouth daily.  07/07/12  Yes Historical Provider, MD  lansoprazole (PREVACID) 30 MG capsule Take 30 mg by mouth every morning.    Yes Historical Provider, MD  montelukast (SINGULAIR) 10 MG tablet Take 10 mg by mouth at bedtime.   Yes Historical Provider, MD  Multiple Vitamin (MULTIVITAMIN) capsule Take 1 capsule by  mouth daily.   Yes Historical Provider, MD  sulfamethoxazole-trimethoprim (BACTRIM) 400-80 MG tablet Take 1 tablet by mouth 2 (two) times daily.   Yes Historical Provider, MD     Positive ROS: All other systems have been reviewed and were otherwise negative with the exception of those mentioned in the HPI and as above.  Physical Exam: General: Alert, no acute distress Cardiovascular: Regular rate and rhythm, no murmurs rubs or gallops.  No pedal edema Respiratory: Clear to auscultation bilaterally, no wheezes rales or rhonchi. No cyanosis, no use of accessory musculature GI: No organomegaly, abdomen is soft and non-tender nondistended  with positive bowel sounds. Skin: Skin intact, no lesions within the operative field. Neurologic: Sensation intact distally Psychiatric: Patient is competent for consent with normal mood and affect Lymphatic: No cervical lymphadenopathy  MUSCULOSKELETAL: Left knee: Patient is well healed midline incision. Motion is from approximately -3 to 80 active flexion.  She has intact sensation light touch throughout the left lower extremity. She has palpable pedal pulses. She has intact motor function including active flexion extension of her toes and dorsiflexion plantarflexion of her ankle. She has 5 out of 5 strength in all muscle groups of the left lower extremity.  Assessment: M24.60 Ankylosis, unspecified joint  Plan: Plan for Procedure(s): CLOSED MANIPULATION LEFT KNEE  I discussed the procedure to the patient today. She understands the plan is for a closed manipulation. They'll be no open procedure today. If we are unable to regain sufficient motion to the left knee with closed manipulation arthroscopic release would be considered at a later date.  I discussed the risks and benefits of surgery. The risks include but are not limited to fracture, loosening of the prosthesis, dislocation, persistent stiffness or pain, redevelopment of joint stiffness or loss of motion and the need for further surgery.  Patient understood these risks and wished to proceed.   Thornton Park, MD   04/15/2015 10:27 AM

## 2015-04-15 NOTE — Anesthesia Preprocedure Evaluation (Signed)
Anesthesia Evaluation  Patient identified by MRN, date of birth, ID band Patient awake    Reviewed: Allergy & Precautions, H&P , NPO status , Patient's Chart, lab work & pertinent test results  History of Anesthesia Complications Negative for: history of anesthetic complications  Airway Mallampati: III  TM Distance: >3 FB Neck ROM: full    Dental  (+) Poor Dentition   Pulmonary neg shortness of breath, Current Smoker,    Pulmonary exam normal breath sounds clear to auscultation       Cardiovascular Exercise Tolerance: Good hypertension, (-) angina(-) Past MI and (-) DOE Normal cardiovascular exam Rhythm:regular Rate:Normal     Neuro/Psych PSYCHIATRIC DISORDERS Anxiety negative neurological ROS     GI/Hepatic Neg liver ROS, GERD  Controlled,  Endo/Other  negative endocrine ROS  Renal/GU negative Renal ROS  negative genitourinary   Musculoskeletal   Abdominal   Peds  Hematology negative hematology ROS (+)   Anesthesia Other Findings Past Medical History:   Hypertension                                                 Hyperlipidemia                                               Allergy                                                      Anxiety                                                      GERD (gastroesophageal reflux disease)                      Past Surgical History:   ABDOMINAL HYSTERECTOMY                                        KNEE SURGERY                                                  ABDOMINAL HYSTERECTOMY                                        TUBAL LIGATION                                                TOTAL KNEE ARTHROPLASTY  Left 12/25/2014      Comment:Procedure: TOTAL KNEE ARTHROPLASTY;  Surgeon:               Thornton Park, MD;  Location: [facility] ORS;                Service: Orthopedics;  Laterality: Left;   BREAST BIOPSY                                   Bilateral  2012         BREAST BIOPSY                                   Right 08-07-12        Comment:fibroadenomatous changes and columnar cells   BREAST BIOPSY                                    02/03/2015      Comment:stereo byrnett  BMI    Body Mass Index   36.88 kg/m 2      Reproductive/Obstetrics negative OB ROS                             Anesthesia Physical Anesthesia Plan  ASA: III  Anesthesia Plan: General   Post-op Pain Management:    Induction:   Airway Management Planned:   Additional Equipment:   Intra-op Plan:   Post-operative Plan:   Informed Consent: I have reviewed the patients History and Physical, chart, labs and discussed the procedure including the risks, benefits and alternatives for the proposed anesthesia with the patient or authorized representative who has indicated his/her understanding and acceptance.   Dental Advisory Given  Plan Discussed with: Anesthesiologist, CRNA and Surgeon  Anesthesia Plan Comments:         Anesthesia Quick Evaluation

## 2015-04-15 NOTE — Transfer of Care (Signed)
Immediate Anesthesia Transfer of Care Note  Patient: Leslie [name]  Procedure(s) Performed: Procedure(s): CLOSED MANIPULATION KNEE (Left)  Patient Location: PACU  Anesthesia Type:General  Level of Consciousness: awake and alert   Airway & Oxygen Therapy: Patient Spontanous Breathing and Patient connected to face mask oxygen  Post-op Assessment: Report given to RN  Post vital signs: Reviewed  Last Vitals:  Filed Vitals:   04/15/15 0934 04/15/15 1106  BP: 144/81 125/67  Pulse: 106 116  Temp: 37 C 36.6 C  Resp: 16 24    Complications: No apparent anesthesia complications

## 2015-04-15 NOTE — Progress Notes (Signed)
Pt states pain is better  Down to6 and wants to go home

## 2015-04-15 NOTE — Op Note (Signed)
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 female with a diagnosis of M24.60 Ankylosis, left knee joint who failed conservative measures and elected for surgical management.    I discussed the risks and benefits of surgery. The risks include but are not limited to fracture, dislocation, loosening of the hardware, persistent joint stiffness or loss of motion, persistent pain, weakness or instability, hardware failure and the need for further surgery. Medical risks include but are not limited to DVT and pulmonary embolism, myocardial infarction, stroke, pneumonia, respiratory failure and death. Patient understood these risks and wished to proceed.  OPERATIVE PROCEDURE: Patient was met in the preoperative area. I performed a preoperative H&P. Her left knee was signed with my initials and the word yes according the hospital's correct site of surgery protocol. She was then brought to the operating room she underwent general anesthesia with an LMA. A timeout was performed to verify the patient's name, date of birth, medical record number and correct procedure to be performed. Once all incisions were given case began.  Examination under anesthesia revealed patient had approximately -3 extension to 80 of flexion.  There is no ligamentous laxity to varus valgus stress testing about the left knee range of motion. She had no instability to Lachman's test or anterior posterior drawer testing. Patient had a flexion force applied to her proximal tibia in the area of the tibial tubercle. I gently leaned into the patient's knee. Audible popping of the arthrofibrosis scar bands were heard. Patient's knee was taken through a  range of motion and repeatedly. The patient at the conclusion of the manipulation could achieve near full extension to 110 degrees.  The patient was then awoken from anesthesia. She was brought to the PACU in stable condition. I asked present the entire case. I spoke with the patient's father and the postoperative consultation room to let him know the manipulation had been performed without complication. Patient has physical therapy scheduled for the next 3 days. She was given a dose of Solu-Medrol in the OR and will continue taking her prednisone taper over the next several days to reduce inflammation and slow 3 formation of scar tissue. She will follow-up in my office in approximately 7-10 days.    Timoteo Gaul, M.D.

## 2015-10-22 ENCOUNTER — Encounter: Payer: Self-pay | Admitting: *Deleted

## 2015-11-17 ENCOUNTER — Other Ambulatory Visit: Payer: Self-pay | Admitting: Orthopedic Surgery

## 2015-11-17 DIAGNOSIS — M25562 Pain in left knee: Secondary | ICD-10-CM

## 2015-11-25 ENCOUNTER — Encounter
Admission: RE | Admit: 2015-11-25 | Discharge: 2015-11-25 | Disposition: A | Discharge status: Home / Self Care | Payer: 59 | Source: Ambulatory Visit | Attending: Orthopedic Surgery | Admitting: Orthopedic Surgery

## 2015-11-25 DIAGNOSIS — M25562 Pain in left knee: Secondary | ICD-10-CM | POA: Diagnosis not present

## 2015-11-25 MED ORDER — TECHNETIUM TC 99M MEDRONATE IV KIT
22.0000 | PACK | Freq: Once | INTRAVENOUS | Status: AC | PRN
  Administered 2015-11-25: 21.16 via INTRAVENOUS

## 2015-12-23 ENCOUNTER — Ambulatory Visit: Payer: Managed Care, Other (non HMO) | Admitting: General Surgery

## 2015-12-29 ENCOUNTER — Encounter: Payer: Self-pay | Admitting: General Surgery

## 2016-01-01 ENCOUNTER — Encounter: Payer: Self-pay | Admitting: *Deleted

## 2016-01-06 ENCOUNTER — Ambulatory Visit (INDEPENDENT_AMBULATORY_CARE_PROVIDER_SITE_OTHER): Payer: 59 | Admitting: General Surgery

## 2016-01-06 VITALS — BP 130/72 | HR 76 | Resp 12 | Ht 64.0 in | Wt 216.0 lb

## 2016-01-06 DIAGNOSIS — Z803 Family history of malignant neoplasm of breast: Secondary | ICD-10-CM

## 2016-01-06 DIAGNOSIS — N6019 Diffuse cystic mastopathy of unspecified breast: Secondary | ICD-10-CM | POA: Diagnosis not present

## 2016-01-06 DIAGNOSIS — R1011 Right upper quadrant pain: Secondary | ICD-10-CM | POA: Diagnosis not present

## 2016-01-06 NOTE — Progress Notes (Signed)
Patient ID: Leslie [name], female   DOB: [birth year], 50 y.o.   MRN: [id number]  Chief Complaint  Patient presents with  . Follow-up    mammogram     HPI Leslie [name] is a 50 y.o. female who presents for a breast evaluation. The most recent mammogram was done on 12/26/2015. She has been having right upper quadrant pain and this started about six months ago. Pain comes and goes, she feels bloated. This has been becoming more frequent.  Ultrasound done on 12/24/2015.  Patient does perform regular self breast checks and gets regular mammograms done.  Prior breast biopsies with benign findings.  HPI  Past Medical History:  Diagnosis Date  . Allergy   . Anxiety   . GERD (gastroesophageal reflux disease)   . Hyperlipidemia   . Hypertension     Past Surgical History:  Procedure Laterality Date  . ABDOMINAL HYSTERECTOMY    . ABDOMINAL HYSTERECTOMY    . BREAST BIOPSY Bilateral 2012  . BREAST BIOPSY Right 08-07-12   fibroadenomatous changes and columnar cells  . BREAST BIOPSY  02/03/2015   stereo byrnett  . KNEE CLOSED REDUCTION Left 04/15/2015   Procedure: CLOSED MANIPULATION KNEE;  Surgeon: Thornton Park, MD;  Location: [facility] ORS;  Service: Orthopedics;  Laterality: Left;  . KNEE SURGERY    . TOTAL KNEE ARTHROPLASTY Left 12/25/2014   Procedure: TOTAL KNEE ARTHROPLASTY;  Surgeon: Thornton Park, MD;  Location: [facility] ORS;  Service: Orthopedics;  Laterality: Left;  . TUBAL LIGATION      Family History  Problem Relation Age of Onset  . Cancer Mother 53    breast  . Cancer Maternal Aunt     breast  . Cancer Maternal Grandmother     breast    Social History Social History  Substance Use Topics  . Smoking status: Current Every Day Smoker    Types: Cigarettes  . Smokeless tobacco: Never Used  . Alcohol use 1.2 oz/week    2 Glasses of wine per week    Allergies  Allergen Reactions  . Oxycodone Hcl Itching  . Dilaudid [Hydromorphone Hcl] Itching  . Morphine And Related  Hives    Current Outpatient Prescriptions  Medication Sig Dispense Refill  . albuterol (PROVENTIL HFA;VENTOLIN HFA) 108 (90 BASE) MCG/ACT inhaler Inhale 2 puffs into the lungs every 6 (six) hours as needed for wheezing or shortness of breath.    . ALPRAZolam (XANAX) 0.5 MG tablet Take 0.5 mg by mouth 2 (two) times daily as needed for anxiety.    Marland Kitchen atorvastatin (LIPITOR) 20 MG tablet Take 20 mg by mouth daily at 6 PM.     . cetirizine (ZYRTEC) 10 MG tablet Take 10 mg by mouth at bedtime.    Marland Kitchen estradiol (ESTRACE) 0.5 MG tablet Take 0.5 mg by mouth daily.    . fluticasone (FLONASE) 50 MCG/ACT nasal spray Place 2 sprays into both nostrils daily as needed for allergies or rhinitis.    . hydrochlorothiazide (HYDRODIURIL) 25 MG tablet Take 25 mg by mouth daily.     . lansoprazole (PREVACID) 30 MG capsule Take 30 mg by mouth every morning.     . montelukast (SINGULAIR) 10 MG tablet Take 10 mg by mouth at bedtime.    . Multiple Vitamin (MULTIVITAMIN) capsule Take 1 capsule by mouth daily.     No current facility-administered medications for this visit.     Review of Systems Review of Systems  Constitutional: Negative.   Respiratory: Negative.   Cardiovascular:  Negative.     Blood pressure 130/72, pulse 76, resp. rate 12, height 5\' 4"  (1.626 m), weight 216 lb (98 kg).  Physical Exam Physical Exam  Constitutional: She is oriented to person, place, and time. She appears well-developed and well-nourished.  Eyes: Conjunctivae are normal. No scleral icterus.  Neck: Neck supple.  Cardiovascular: Normal rate, regular rhythm and normal heart sounds.   Pulmonary/Chest: Effort normal and breath sounds normal. Right breast exhibits no inverted nipple, no mass, no nipple discharge, no skin change and no tenderness. Left breast exhibits inverted nipple (chronic ). Left breast exhibits no mass, no nipple discharge, no skin change and no tenderness.  Abdominal: Soft. Bowel sounds are normal. There is no  hepatomegaly. There is no tenderness. No hernia.  Lymphadenopathy:    She has no cervical adenopathy.    She has no axillary adenopathy.  Neurological: She is alert and oriented to person, place, and time.  Skin: Skin is warm and dry.  Nursing note reviewed.   Data Reviewed Mammogram reviewed -stable [place]- gallstones , no wall thickening or pericholecystic fluid Assessment     Stable exam. History of benign breast biopsies. FH of breast cancer  Gallstones with biliary colic Plan     Laparoscopic Cholecystectomy with Intraoperative Cholangiogram. The procedure, including it's potential risks and complications (including but not limited to infection, bleeding, injury to intra-abdominal organs or bile ducts, bile leak, poor cosmetic result, sepsis and death) were discussed with the patient in detail. Non-operative options, including their inherent risks (acute calculous cholecystitis with possible choledocholithiasis or gallstone pancreatitis, with the risk of ascending cholangitis, sepsis, and death) were discussed as well. The patient expressed and understanding of what we discussed and wishes to proceed with laparoscopic cholecystectomy. The patient further understands that if it is technically not possible, or it is unsafe to proceed laparoscopically, that I will convert to an open cholecystectomy.   This patient's surgery has been scheduled for 02-06-16 at [facility].   Patient will be asked to return to the office in one year with a bilateral screening mammogram.   This information has been scribed by Gaspar Cola CMA.        SANKAR,SEEPLAPUTHUR G 01/07/2016, 3:56 PM

## 2016-01-06 NOTE — Patient Instructions (Signed)
Patient will be asked to return to the office in one year with a bilateral screening mammogram. 

## 2016-01-27 ENCOUNTER — Inpatient Hospital Stay: Admission: RE | Admit: 2016-01-27 | Payer: Managed Care, Other (non HMO) | Source: Ambulatory Visit

## 2016-01-28 ENCOUNTER — Inpatient Hospital Stay: Admission: RE | Admit: 2016-01-28 | Payer: Managed Care, Other (non HMO) | Source: Ambulatory Visit

## 2016-01-29 ENCOUNTER — Encounter
Admission: RE | Admit: 2016-01-29 | Discharge: 2016-01-29 | Disposition: A | Discharge status: Home / Self Care | Payer: Managed Care, Other (non HMO) | Source: Ambulatory Visit | Attending: General Surgery | Admitting: General Surgery

## 2016-01-29 HISTORY — DX: Personal history of urinary calculi: Z87.442

## 2016-01-29 HISTORY — DX: Anemia, unspecified: D64.9

## 2016-01-29 NOTE — Patient Instructions (Signed)
  Your procedure is scheduled on: 02-06-16 (FRIDAY) Report to Same Day Surgery 2nd floor medical mall [facility] Entrance-take elevator on left to 2nd floor.  Check in with surgery information desk.) To find out your arrival time please call [phone] between 1PM - 3PM on 02-05-16 [facility])  Remember: Instructions that are not followed completely may result in serious medical risk, up to and including death, or upon the discretion of your surgeon and anesthesiologist your surgery may need to be rescheduled.    _x___ 1. Do not eat food or drink liquids after midnight. No gum chewing or hard candies.     __x__ 2. No Alcohol for 24 hours before or after surgery.   __x__3. No Smoking for 24 prior to surgery.   ____  4. Bring all medications with you on the day of surgery if instructed.    __x__ 5. Notify your doctor if there is any change in your medical condition     (cold, fever, infections).     Do not wear jewelry, make-up, hairpins, clips or nail polish.  Do not wear lotions, powders, or perfumes. You may wear deodorant.  Do not shave 48 hours prior to surgery. Men may shave face and neck.  Do not bring valuables to the hospital.    [facility] is not responsible for any belongings or valuables.               Contacts, dentures or bridgework may not be worn into surgery.  Leave your suitcase in the car. After surgery it may be brought to your room.  For patients admitted to the hospital, discharge time is determined by your treatment team.   Patients discharged the day of surgery will not be allowed to drive home.  You will need someone to drive you home and stay with you the night of your procedure.    Please read over the following fact sheets that you were given:   [facility] Preparing for Surgery and or MRSA Information   _x___ Take these medicines the morning of surgery with A SIP OF WATER:    1. PREVACID  2. ALSO TAKE AN EXTRA PREVACID ON Thursday NIGHT BEFORE BED  (02-05-16)  3. MAY TAKE XANAX IF NEEDED AM OF SURGERY  4.  5.  6.  ____Fleets enema or Magnesium Citrate as directed.   _x___ Use CHG Soap or sage wipes as directed on instruction sheet   _X___ Use inhalers on the day of surgery and bring to hospital day of surgery-USE ALBUTEROL INHALER AT [place]  ____ Stop metformin 2 days prior to surgery    ____ Take 1/2 of usual insulin dose the night before surgery and none on the morning of   surgery.   ____ Stop Aspirin, Coumadin, Pllavix ,Eliquis, Effient, or Pradaxa  x__ Stop Anti-inflammatories such as Advil, Aleve, Ibuprofen, Motrin, Naproxen,          Naprosyn, Goodies powders or aspirin products NOW-Ok to take Tylenol.   ____ Stop supplements until after surgery.    ____ Bring C-Pap to the hospital.

## 2016-02-03 ENCOUNTER — Telehealth: Payer: Self-pay

## 2016-02-03 ENCOUNTER — Encounter
Admission: RE | Admit: 2016-02-03 | Discharge: 2016-02-03 | Disposition: A | Discharge status: Home / Self Care | Payer: 59 | Source: Ambulatory Visit | Attending: General Surgery | Admitting: General Surgery

## 2016-02-03 DIAGNOSIS — E785 Hyperlipidemia, unspecified: Secondary | ICD-10-CM | POA: Diagnosis not present

## 2016-02-03 DIAGNOSIS — Z01810 Encounter for preprocedural cardiovascular examination: Secondary | ICD-10-CM | POA: Diagnosis present

## 2016-02-03 DIAGNOSIS — N6019 Diffuse cystic mastopathy of unspecified breast: Secondary | ICD-10-CM | POA: Diagnosis not present

## 2016-02-03 DIAGNOSIS — I4510 Unspecified right bundle-branch block: Secondary | ICD-10-CM | POA: Diagnosis not present

## 2016-02-03 DIAGNOSIS — K219 Gastro-esophageal reflux disease without esophagitis: Secondary | ICD-10-CM | POA: Diagnosis not present

## 2016-02-03 DIAGNOSIS — Z803 Family history of malignant neoplasm of breast: Secondary | ICD-10-CM | POA: Insufficient documentation

## 2016-02-03 DIAGNOSIS — I252 Old myocardial infarction: Secondary | ICD-10-CM | POA: Diagnosis not present

## 2016-02-03 DIAGNOSIS — I517 Cardiomegaly: Secondary | ICD-10-CM | POA: Insufficient documentation

## 2016-02-03 DIAGNOSIS — Z87442 Personal history of urinary calculi: Secondary | ICD-10-CM | POA: Diagnosis not present

## 2016-02-03 DIAGNOSIS — D649 Anemia, unspecified: Secondary | ICD-10-CM | POA: Diagnosis not present

## 2016-02-03 DIAGNOSIS — Z01812 Encounter for preprocedural laboratory examination: Secondary | ICD-10-CM | POA: Insufficient documentation

## 2016-02-03 DIAGNOSIS — Z96659 Presence of unspecified artificial knee joint: Secondary | ICD-10-CM | POA: Diagnosis not present

## 2016-02-03 DIAGNOSIS — I10 Essential (primary) hypertension: Secondary | ICD-10-CM | POA: Diagnosis not present

## 2016-02-03 LAB — POTASSIUM: Potassium: 3.4 mmol/L — ABNORMAL LOW (ref 3.5–5.1)

## 2016-02-03 NOTE — Telephone Encounter (Signed)
Leslie [name] with pre admit called and said that the patient would need medical clearance for her surgery scheduled for 02/06/16 due to an abnormal EKG. Hopkins Boscia's office at [facility] has been sent the request as well as a copy of the EKG. The patient's surgery will be canceled for now until she can get clearance for this, either by her PCP or if need be by cardiology. The patient is aware.

## 2016-02-03 NOTE — Pre-Procedure Instructions (Signed)
CALLED DR Ronelle Nigh REGARDING ABNORMAL EKG-DR [place] CARLY-LYN AT DR [facility] OFFICE AND NOTIFIED HER THAT PT IS NEEDING MEDICAL CLEARANCE-FAXING ALL INFO ALONG WITH EKG TO DR [facility] OFFICE AND PCP OFFICE

## 2016-02-03 NOTE — Pre-Procedure Instructions (Signed)
Called over to [facility] medical to make sure they received my paperwork for clearance.  I was unable to speak with someone directly but I did leave a message with my name and # and informed them this pt needs clearance prior to her 02-06-16 surgery and they will need to be the one to call pt with appt

## 2016-02-06 ENCOUNTER — Encounter: Admission: RE | Payer: Self-pay | Source: Ambulatory Visit

## 2016-02-06 ENCOUNTER — Ambulatory Visit: Admission: RE | Admit: 2016-02-06 | Payer: 59 | Source: Ambulatory Visit | Admitting: General Surgery

## 2016-02-06 SURGERY — LAPAROSCOPIC CHOLECYSTECTOMY WITH INTRAOPERATIVE CHOLANGIOGRAM
Anesthesia: Choice

## 2016-02-09 ENCOUNTER — Telehealth: Payer: Self-pay | Admitting: *Deleted

## 2016-02-09 NOTE — Telephone Encounter (Signed)
Patient's surgery has been rescheduled for 02-27-16 at [facility].   Medical clearance has been faxed to the Pre-admission Testing Department today.

## 2016-02-09 NOTE — Telephone Encounter (Signed)
Message left for patient to call the office.   We have received medical clearance from Leretha Pol, FNP for moderate risk for surgery.   We need to see if she would like to go ahead and reschedule gallbladder surgery.

## 2016-02-11 DIAGNOSIS — M79605 Pain in left leg: Secondary | ICD-10-CM | POA: Insufficient documentation

## 2016-02-19 DIAGNOSIS — J40 Bronchitis, not specified as acute or chronic: Secondary | ICD-10-CM

## 2016-02-19 HISTORY — DX: Bronchitis, not specified as acute or chronic: J40

## 2016-02-20 ENCOUNTER — Encounter
Admission: RE | Admit: 2016-02-20 | Discharge: 2016-02-20 | Disposition: A | Discharge status: Home / Self Care | Payer: 59 | Source: Ambulatory Visit | Attending: General Surgery | Admitting: General Surgery

## 2016-02-20 HISTORY — DX: Bronchitis, not specified as acute or chronic: J40

## 2016-02-20 NOTE — Pre-Procedure Instructions (Signed)
MEDICAL CLEARANCE NOTE ON CHART-MODERATE  RISK

## 2016-02-26 MED ORDER — CEFAZOLIN SODIUM-DEXTROSE 2-4 GM/100ML-% IV SOLN
2.0000 g | INTRAVENOUS | Status: AC
  Administered 2016-02-27: 2 g via INTRAVENOUS

## 2016-02-27 ENCOUNTER — Ambulatory Visit: Payer: 59 | Admitting: Anesthesiology

## 2016-02-27 ENCOUNTER — Encounter
Admission: RE | Disposition: A | Discharge status: Home / Self Care | Payer: Self-pay | Source: Ambulatory Visit | Attending: General Surgery

## 2016-02-27 ENCOUNTER — Ambulatory Visit
Admission: RE | Admit: 2016-02-27 | Discharge: 2016-02-27 | Disposition: A | Discharge status: Home / Self Care | Payer: 59 | Source: Ambulatory Visit | Attending: General Surgery | Admitting: General Surgery

## 2016-02-27 ENCOUNTER — Encounter: Payer: Self-pay | Admitting: *Deleted

## 2016-02-27 ENCOUNTER — Ambulatory Visit: Payer: 59

## 2016-02-27 DIAGNOSIS — R1011 Right upper quadrant pain: Secondary | ICD-10-CM

## 2016-02-27 DIAGNOSIS — F419 Anxiety disorder, unspecified: Secondary | ICD-10-CM | POA: Insufficient documentation

## 2016-02-27 DIAGNOSIS — Z885 Allergy status to narcotic agent status: Secondary | ICD-10-CM | POA: Diagnosis not present

## 2016-02-27 DIAGNOSIS — E785 Hyperlipidemia, unspecified: Secondary | ICD-10-CM | POA: Insufficient documentation

## 2016-02-27 DIAGNOSIS — K219 Gastro-esophageal reflux disease without esophagitis: Secondary | ICD-10-CM | POA: Insufficient documentation

## 2016-02-27 DIAGNOSIS — F17210 Nicotine dependence, cigarettes, uncomplicated: Secondary | ICD-10-CM | POA: Insufficient documentation

## 2016-02-27 DIAGNOSIS — K8010 Calculus of gallbladder with chronic cholecystitis without obstruction: Secondary | ICD-10-CM | POA: Insufficient documentation

## 2016-02-27 DIAGNOSIS — I10 Essential (primary) hypertension: Secondary | ICD-10-CM | POA: Diagnosis not present

## 2016-02-27 DIAGNOSIS — Z87442 Personal history of urinary calculi: Secondary | ICD-10-CM | POA: Insufficient documentation

## 2016-02-27 DIAGNOSIS — Z419 Encounter for procedure for purposes other than remedying health state, unspecified: Secondary | ICD-10-CM

## 2016-02-27 HISTORY — PX: CHOLECYSTECTOMY: SHX55

## 2016-02-27 SURGERY — LAPAROSCOPIC CHOLECYSTECTOMY WITH INTRAOPERATIVE CHOLANGIOGRAM
Anesthesia: General | Site: Abdomen | Wound class: Clean Contaminated

## 2016-02-27 MED ORDER — FAMOTIDINE 20 MG PO TABS
ORAL_TABLET | ORAL | Status: AC
  Filled 2016-02-27: qty 1

## 2016-02-27 MED ORDER — SODIUM CHLORIDE 0.9 % IJ SOLN
INTRAMUSCULAR | Status: AC
  Filled 2016-02-27: qty 50

## 2016-02-27 MED ORDER — PROMETHAZINE HCL 25 MG/ML IJ SOLN
12.5000 mg | Freq: Once | INTRAMUSCULAR | Status: AC
  Administered 2016-02-27: 12.5 mg via INTRAVENOUS

## 2016-02-27 MED ORDER — FENTANYL CITRATE (PF) 100 MCG/2ML IJ SOLN
INTRAMUSCULAR | Status: AC
  Filled 2016-02-27: qty 2

## 2016-02-27 MED ORDER — ACETAMINOPHEN 10 MG/ML IV SOLN
INTRAVENOUS | Status: DC | PRN
  Administered 2016-02-27: 1000 mg via INTRAVENOUS

## 2016-02-27 MED ORDER — LIDOCAINE HCL (CARDIAC) 20 MG/ML IV SOLN
INTRAVENOUS | Status: DC | PRN
  Administered 2016-02-27: 100 mg via INTRAVENOUS

## 2016-02-27 MED ORDER — CEFAZOLIN SODIUM-DEXTROSE 2-4 GM/100ML-% IV SOLN
INTRAVENOUS | Status: AC
  Administered 2016-02-27: 2 g via INTRAVENOUS
  Filled 2016-02-27: qty 100

## 2016-02-27 MED ORDER — PROPOFOL 10 MG/ML IV BOLUS
INTRAVENOUS | Status: AC
  Filled 2016-02-27: qty 20

## 2016-02-27 MED ORDER — MIDAZOLAM HCL 2 MG/2ML IJ SOLN
INTRAMUSCULAR | Status: AC
  Filled 2016-02-27: qty 2

## 2016-02-27 MED ORDER — HYDROCODONE-ACETAMINOPHEN 5-325 MG PO TABS
1.0000 | ORAL_TABLET | ORAL | Status: AC | PRN
  Administered 2016-02-27: 1 via ORAL

## 2016-02-27 MED ORDER — FENTANYL CITRATE (PF) 100 MCG/2ML IJ SOLN
INTRAMUSCULAR | Status: DC | PRN
  Administered 2016-02-27: 50 ug via INTRAVENOUS
  Administered 2016-02-27: 100 ug via INTRAVENOUS

## 2016-02-27 MED ORDER — SUCCINYLCHOLINE CHLORIDE 200 MG/10ML IV SOSY
PREFILLED_SYRINGE | INTRAVENOUS | Status: AC
  Filled 2016-02-27: qty 10

## 2016-02-27 MED ORDER — SUGAMMADEX SODIUM 200 MG/2ML IV SOLN
INTRAVENOUS | Status: AC
  Filled 2016-02-27: qty 2

## 2016-02-27 MED ORDER — HYDROCODONE-ACETAMINOPHEN 5-325 MG PO TABS
ORAL_TABLET | ORAL | Status: AC
  Filled 2016-02-27: qty 1

## 2016-02-27 MED ORDER — ROCURONIUM BROMIDE 50 MG/5ML IV SOLN
INTRAVENOUS | Status: AC
  Filled 2016-02-27: qty 1

## 2016-02-27 MED ORDER — BUPIVACAINE HCL (PF) 0.5 % IJ SOLN
INTRAMUSCULAR | Status: AC
  Filled 2016-02-27: qty 30

## 2016-02-27 MED ORDER — FENTANYL CITRATE (PF) 100 MCG/2ML IJ SOLN
25.0000 ug | INTRAMUSCULAR | Status: DC | PRN
  Administered 2016-02-27 (×2): 50 ug via INTRAVENOUS

## 2016-02-27 MED ORDER — GLYCOPYRROLATE 0.2 MG/ML IJ SOLN
INTRAMUSCULAR | Status: AC
  Filled 2016-02-27: qty 1

## 2016-02-27 MED ORDER — DEXAMETHASONE SODIUM PHOSPHATE 10 MG/ML IJ SOLN
INTRAMUSCULAR | Status: AC
  Filled 2016-02-27: qty 1

## 2016-02-27 MED ORDER — ONDANSETRON HCL 4 MG/2ML IJ SOLN
INTRAMUSCULAR | Status: DC | PRN
  Administered 2016-02-27: 4 mg via INTRAVENOUS

## 2016-02-27 MED ORDER — LACTATED RINGERS IR SOLN
Status: DC | PRN
  Administered 2016-02-27: 1

## 2016-02-27 MED ORDER — SODIUM CHLORIDE 0.9 % IJ SOLN
INTRAMUSCULAR | Status: AC
  Filled 2016-02-27: qty 10

## 2016-02-27 MED ORDER — PHENYLEPHRINE HCL 10 MG/ML IJ SOLN
INTRAMUSCULAR | Status: AC
  Filled 2016-02-27: qty 1

## 2016-02-27 MED ORDER — ONDANSETRON HCL 4 MG/2ML IJ SOLN
INTRAMUSCULAR | Status: AC
  Filled 2016-02-27: qty 2

## 2016-02-27 MED ORDER — HYDROCODONE-ACETAMINOPHEN 5-325 MG PO TABS: 1.0000 | ORAL_TABLET | ORAL | 0 refills | Status: DC | PRN

## 2016-02-27 MED ORDER — DEXAMETHASONE SODIUM PHOSPHATE 10 MG/ML IJ SOLN
INTRAMUSCULAR | Status: DC | PRN
  Administered 2016-02-27: 5 mg via INTRAVENOUS

## 2016-02-27 MED ORDER — MIDAZOLAM HCL 2 MG/2ML IJ SOLN
INTRAMUSCULAR | Status: DC | PRN
  Administered 2016-02-27: 2 mg via INTRAVENOUS

## 2016-02-27 MED ORDER — ACETAMINOPHEN 10 MG/ML IV SOLN
INTRAVENOUS | Status: AC
  Filled 2016-02-27: qty 100

## 2016-02-27 MED ORDER — LIDOCAINE HCL (PF) 2 % IJ SOLN
INTRAMUSCULAR | Status: AC
  Filled 2016-02-27: qty 2

## 2016-02-27 MED ORDER — SUCCINYLCHOLINE CHLORIDE 20 MG/ML IJ SOLN
INTRAMUSCULAR | Status: DC | PRN
  Administered 2016-02-27: 200 mg via INTRAVENOUS

## 2016-02-27 MED ORDER — EPHEDRINE SULFATE 50 MG/ML IJ SOLN
INTRAMUSCULAR | Status: AC
  Filled 2016-02-27: qty 1

## 2016-02-27 MED ORDER — PHENYLEPHRINE HCL 10 MG/ML IJ SOLN
INTRAMUSCULAR | Status: DC | PRN
  Administered 2016-02-27: 100 ug via INTRAVENOUS

## 2016-02-27 MED ORDER — LACTATED RINGERS IV SOLN
INTRAVENOUS | Status: DC
  Administered 2016-02-27: 10:00:00 via INTRAVENOUS

## 2016-02-27 MED ORDER — SUGAMMADEX SODIUM 200 MG/2ML IV SOLN
INTRAVENOUS | Status: DC | PRN
  Administered 2016-02-27: 200 mg via INTRAVENOUS

## 2016-02-27 MED ORDER — PROMETHAZINE HCL 25 MG/ML IJ SOLN
INTRAMUSCULAR | Status: AC
  Filled 2016-02-27: qty 1

## 2016-02-27 MED ORDER — CHLORHEXIDINE GLUCONATE CLOTH 2 % EX PADS: 6.0000 | MEDICATED_PAD | Freq: Once | CUTANEOUS | Status: DC

## 2016-02-27 MED ORDER — BUPIVACAINE HCL 0.5 % IJ SOLN
INTRAMUSCULAR | Status: DC | PRN
  Administered 2016-02-27: 30 mL

## 2016-02-27 MED ORDER — PROPOFOL 10 MG/ML IV BOLUS
INTRAVENOUS | Status: DC | PRN
  Administered 2016-02-27: 200 mg via INTRAVENOUS

## 2016-02-27 MED ORDER — ROCURONIUM BROMIDE 100 MG/10ML IV SOLN
INTRAVENOUS | Status: DC | PRN
  Administered 2016-02-27: 15 mg via INTRAVENOUS
  Administered 2016-02-27: 25 mg via INTRAVENOUS

## 2016-02-27 SURGICAL SUPPLY — 42 items
ANCHOR TIS RET SYS 235ML (MISCELLANEOUS) ×2 IMPLANT
APPLICATOR SURGIFLO (MISCELLANEOUS) IMPLANT
APPLIER CLIP LOGIC TI 5 (MISCELLANEOUS) ×4 IMPLANT
BLADE SURG 11 STRL SS SAFETY (MISCELLANEOUS) ×2 IMPLANT
CANISTER SUCT 1200ML W/VALVE (MISCELLANEOUS) ×2 IMPLANT
CANNULA DILATOR 10 W/SLV (CANNULA) ×2 IMPLANT
CATH CHOLANG 76X19 KUMAR (CATHETERS) ×2 IMPLANT
CATH REDDICK CHOLANGI 4FR 50CM (CATHETERS) ×2 IMPLANT
CHLORAPREP W/TINT 26ML (MISCELLANEOUS) ×2 IMPLANT
DEFOGGER SCOPE WARMER CLEARIFY (MISCELLANEOUS) ×2 IMPLANT
DRAPE C-ARM XRAY 36X54 (DRAPES) ×2 IMPLANT
DRAPE INCISE IOBAN 66X45 STRL (DRAPES) ×2 IMPLANT
DRESSING TELFA 4X3 1S ST N-ADH (GAUZE/BANDAGES/DRESSINGS) ×2 IMPLANT
DRSG TEGADERM 2-3/8X2-3/4 SM (GAUZE/BANDAGES/DRESSINGS) ×8 IMPLANT
ELECT REM PT RETURN 9FT ADLT (ELECTROSURGICAL) ×2
ELECTRODE REM PT RTRN 9FT ADLT (ELECTROSURGICAL) ×1 IMPLANT
GLOVE BIO SURGEON STRL SZ7 (GLOVE) ×4 IMPLANT
GOWN STRL REUS W/ TWL LRG LVL3 (GOWN DISPOSABLE) ×3 IMPLANT
GOWN STRL REUS W/TWL LRG LVL3 (GOWN DISPOSABLE) ×3
GRASPER SUT TROCAR 14GX15 (MISCELLANEOUS) ×2 IMPLANT
HEMOSTAT SURGICEL 2X3 (HEMOSTASIS) IMPLANT
IRRIGATION STRYKERFLOW (MISCELLANEOUS) ×1 IMPLANT
IRRIGATOR STRYKERFLOW (MISCELLANEOUS) ×2
IV LACTATED RINGERS 1000ML (IV SOLUTION) ×2 IMPLANT
KIT RM TURNOVER STRD PROC AR (KITS) ×2 IMPLANT
LABEL OR SOLS (LABEL) ×2 IMPLANT
LOOP SUT CHROMIC 2-0 SGL1 (SUTURE) ×2 IMPLANT
NDL HPO THNWL 1X22GA REG BVL (NEEDLE) ×1 IMPLANT
NDL INSUFF ACCESS 14 VERSASTEP (NEEDLE) ×2 IMPLANT
NEEDLE SAFETY 22GX1 (NEEDLE) ×1
PACK LAP CHOLECYSTECTOMY (MISCELLANEOUS) ×2 IMPLANT
SCISSORS METZENBAUM CVD 33 (INSTRUMENTS) ×2 IMPLANT
SLEEVE ENDOPATH XCEL 5M (ENDOMECHANICALS) ×4 IMPLANT
SPOGE SURGIFLO 8M (HEMOSTASIS)
SPONGE SURGIFLO 8M (HEMOSTASIS) IMPLANT
STRIP CLOSURE SKIN 1/2X4 (GAUZE/BANDAGES/DRESSINGS) ×2 IMPLANT
SUT VIC AB 0 SH 27 (SUTURE) ×2 IMPLANT
SUT VIC AB 4-0 FS2 27 (SUTURE) ×2 IMPLANT
SWABSTK COMLB BENZOIN TINCTURE (MISCELLANEOUS) ×2 IMPLANT
SYR 10ML LL (SYRINGE) ×2 IMPLANT
TROCAR XCEL NON-BLD 5MMX100MML (ENDOMECHANICALS) ×2 IMPLANT
TUBING INSUFFLATOR HI FLOW (MISCELLANEOUS) ×2 IMPLANT

## 2016-02-27 NOTE — Anesthesia Post-op Follow-up Note (Cosign Needed)
Anesthesia QCDR form completed.        

## 2016-02-27 NOTE — Transfer of Care (Signed)
Immediate Anesthesia Transfer of Care Note  Patient: Leslie [name]  Procedure(s) Performed: Procedure(s): LAPAROSCOPIC CHOLECYSTECTOMY (N/A)  Patient Location: PACU  Anesthesia Type:General  Level of Consciousness: awake  Airway & Oxygen Therapy: Patient connected to face mask oxygen  Post-op Assessment: Report given to RN  Post vital signs: stable  Last Vitals:  Vitals:   02/27/16 0904 02/27/16 1227  BP: 126/72 136/85  Pulse: (!) 106 (!) 118  Resp: 18 17  Temp: 36.7 C 36.4 C    Last Pain:  Vitals:   02/27/16 1227  TempSrc:   PainSc: 8      Past Medical History:  Diagnosis Date  . Allergy   . Anemia    h/o  . Anxiety   . Bronchitis 02/19/2016   ON LEVAQUIN PO  . GERD (gastroesophageal reflux disease)   . History of kidney stones   . Hyperlipidemia   . Hypertension    Past Surgical History:  Procedure Laterality Date  . ABDOMINAL HYSTERECTOMY    . ABDOMINAL HYSTERECTOMY    . BREAST BIOPSY Bilateral 2012  . BREAST BIOPSY Right 08-07-12   fibroadenomatous changes and columnar cells  . BREAST BIOPSY  02/03/2015   stereo byrnett  . JOINT REPLACEMENT Left   . KNEE CLOSED REDUCTION Left 04/15/2015   Procedure: CLOSED MANIPULATION KNEE;  Surgeon: Thornton Park, MD;  Location: [facility] ORS;  Service: Orthopedics;  Laterality: Left;  . KNEE SURGERY    . TOTAL KNEE ARTHROPLASTY Left 12/25/2014   Procedure: TOTAL KNEE ARTHROPLASTY;  Surgeon: Thornton Park, MD;  Location: [facility] ORS;  Service: Orthopedics;  Laterality: Left;  . TUBAL LIGATION     Scheduled Meds: . Chlorhexidine Gluconate Cloth  6 each Topical Once   Continuous Infusions: . lactated ringers 50 mL/hr at 02/27/16 0933   PRN Meds:.fentaNYL (SUBLIMAZE) injection     Complications: No apparent anesthesia complications

## 2016-02-27 NOTE — Anesthesia Procedure Notes (Signed)
Procedure Name: Intubation Date/Time: 02/27/2016 10:12 AM Performed by: Zetta Bills Pre-anesthesia Checklist: Patient identified, Emergency Drugs available, Suction available and Patient being monitored Patient Re-evaluated:Patient Re-evaluated prior to inductionOxygen Delivery Method: Circle system utilized Preoxygenation: Pre-oxygenation with 100% oxygen Intubation Type: IV induction Ventilation: Mask ventilation without difficulty Laryngoscope Size: Mac and 3 Grade View: Grade I Tube type: Oral Tube size: 7.0 mm Number of attempts: 1 Airway Equipment and Method: Stylet Placement Confirmation: ETT inserted through vocal cords under direct vision Secured at: 21 cm Tube secured with: Tape Dental Injury: Teeth and Oropharynx as per pre-operative assessment

## 2016-02-27 NOTE — Op Note (Signed)
Preop diagnosis: Chronic cholecystitis and cholelithiasis  Post op diagnosis: Same plus moderate adhesions of the omentum to the abdominal wall  Operation: Laparoscopy cholecystectomy  Surgeon: Mckinley Jewel  Assistant:     Anesthesia: Gen.  Complications: None  EBL: Less than 25 mL  Drains: None  Description: This patient was put to sleep in the supine position the operating table with endotracheal tube and the abdomen prepped and draped sterile field. Timeout was performed. Initial port incision was made just above the umbilicus and a Veress needle introduced in the peritoneal cavity verified of the hanging drop method. Pneumoperitoneum was obtained and 10 mm port was placed. Camera was introduced and there was evidence of some omental adhesions occupying the lower abdomen just below the umbilicus and also some vomiting right left lower quadrant area. Also omental adhesion was encountered attached to the falciform ligament with 2 the required partial take down to allow for visualization the gallbladder. Epigastric and 2 lateral 5 mm ports were placed. The gallbladder was noted to be chronically inflamed with no acute signs there was evidence of stones which were mobile. With careful traction the area of the cystic duct was dissected. It was noted the patient had a very broad cystic duct and tedious dissection was involved in trying to free this up satisfactorily all around the cystic artery was identified which was hemoclipped and cut. The septal branch also was identified which was separately hemoclipped and cut. A few stones spilled out the from the distended area about the cystic duct on and the area was adequately clamp was suctioned out. This to 3 stones were left in a safe place with a cranial removed at the end of the procedure. The cystic duct was opened and because of this pathless nature and cholangiogram could not be completed with the catheter tending to slip out every time. Further  attempts were not made to do the cholangiogram the cystic duct being wide required a use of a Endoloop of 0 Vicryl chromic to seal off. In addition to clips were placed above the Endoloop for additional security. The gallbladder was dissected free from its bed and subsequently removed with a retrieval bag through the umbilical port site. The stone that were placed on and a safe location was identified and removed with stone forceps. The lateral gallbladder bed area was inspected and there was no active bleeding noted and there was no spillage of bile area was irrigated with some saline was suctioned out. Ports were then removed and pneumoperitoneum was released. All skin incisions closed with subcuticular 4-0 Vicryl covered with Steri-Strips and tincture benzoin. Telfa and Tegaderm dressings were placed. Patient subsequently returned recovery room stable condition

## 2016-02-27 NOTE — H&P (Signed)
Leslie [name] is an 51 y.o. female.   Chief Complaint: abdominal pain HPI: 51 yr old female with recurring episodes of RUQ abdominal pain lasting varying periods of time. US showed gallstones. Pt initially scheduled for surgery last month but requireed medical clearance. She is felt to be moderate risk for planned cholecystectomy. Pt denies any new symptoms  Past Medical History:  Diagnosis Date  . Allergy   . Anemia    h/o  . Anxiety   . Bronchitis 02/19/2016   ON LEVAQUIN PO  . GERD (gastroesophageal reflux disease)   . History of kidney stones   . Hyperlipidemia   . Hypertension     Past Surgical History:  Procedure Laterality Date  . ABDOMINAL HYSTERECTOMY    . ABDOMINAL HYSTERECTOMY    . BREAST BIOPSY Bilateral 2012  . BREAST BIOPSY Right 08-07-12   fibroadenomatous changes and columnar cells  . BREAST BIOPSY  02/03/2015   stereo byrnett  . JOINT REPLACEMENT    . KNEE CLOSED REDUCTION Left 04/15/2015   Procedure: CLOSED MANIPULATION KNEE;  Surgeon: Thornton Park, MD;  Location: [facility];  Service: Orthopedics;  Laterality: Left;  . KNEE SURGERY    . TOTAL KNEE ARTHROPLASTY Left 12/25/2014   Procedure: TOTAL KNEE ARTHROPLASTY;  Surgeon: Thornton Park, MD;  Location: [facility];  Service: Orthopedics;  Laterality: Left;  . TUBAL LIGATION      Family History  Problem Relation Age of Onset  . Cancer Mother 84    breast  . Cancer Maternal Aunt     breast  . Cancer Maternal Grandmother     breast   Social History:  reports that she has been smoking Cigarettes.  She has a 10.00 pack-year smoking history. She has never used smokeless tobacco. She reports that she drinks about 1.2 oz of alcohol per week . She reports that she does not use drugs.  Allergies:  Allergies  Allergen Reactions  . Oxycodone Hcl Itching  . Dilaudid [Hydromorphone Hcl] Itching  . Morphine And Related Hives    Medications Prior to Admission  Medication Sig Dispense Refill  . acetaminophen  (TYLENOL) 500 MG tablet Take 500 mg by mouth every 6 (six) hours as needed for mild pain, moderate pain or headache.    . albuterol (PROVENTIL HFA;VENTOLIN HFA) 108 (90 BASE) MCG/ACT inhaler Inhale 2 puffs into the lungs every 6 (six) hours as needed for wheezing or shortness of breath.    . ALPRAZolam (XANAX) 0.5 MG tablet Take 0.5 mg by mouth 2 (two) times daily as needed for anxiety.    Marland Kitchen atorvastatin (LIPITOR) 20 MG tablet Take 20 mg by mouth daily at 6 PM.     . cetirizine (ZYRTEC) 10 MG tablet Take 10 mg by mouth at bedtime.    Marland Kitchen estradiol (ESTRACE) 0.5 MG tablet Take 0.5 mg by mouth daily.    . fluticasone (FLONASE) 50 MCG/ACT nasal spray Place 2 sprays into both nostrils daily as needed for allergies or rhinitis.    . hydrochlorothiazide (HYDRODIURIL) 25 MG tablet Take 25 mg by mouth daily.     . lansoprazole (PREVACID) 30 MG capsule Take 30 mg by mouth every morning.     . montelukast (SINGULAIR) 10 MG tablet Take 10 mg by mouth at bedtime.    . Multiple Vitamin (MULTIVITAMIN) capsule Take 1 capsule by mouth daily.    Marland Kitchen HYDROcodone-homatropine (HYCODAN) 5-1.5 MG/5ML syrup Take 5 mLs by mouth every 6 (six) hours as needed for cough.    Marland Kitchen  levofloxacin (LEVAQUIN) 500 MG tablet Take 500 mg by mouth daily.      No results found for this or any previous visit (from the past 48 hour(s)). No results found.  Review of Systems  Constitutional: Negative.   Respiratory: Negative.   Cardiovascular: Negative.   Gastrointestinal: Positive for abdominal pain. Negative for constipation, diarrhea, heartburn, nausea and vomiting.  Genitourinary: Negative.     There were no vitals taken for this visit. Physical Exam  Constitutional: She is oriented to person, place, and time. She appears well-developed and well-nourished.  Eyes: Conjunctivae are normal. No scleral icterus.  Neck: Neck supple.  Cardiovascular: Normal rate, regular rhythm and normal heart sounds.   Respiratory: Effort normal and  breath sounds normal.  GI: Soft. Bowel sounds are normal. She exhibits no mass. There is no tenderness.  Lymphadenopathy:    She has no cervical adenopathy.  Neurological: She is alert and oriented to person, place, and time.  Skin: Skin is warm and dry.     Assessment/Plan Symptomatic gallstones. Ok to proceed with planned laparoscopy and cholcystectomy  Christene Lye, MD 02/27/2016, 8:55 AM

## 2016-02-27 NOTE — Anesthesia Preprocedure Evaluation (Signed)
Anesthesia Evaluation  Patient identified by MRN, date of birth, ID band Patient awake    Reviewed: Allergy & Precautions, H&P , NPO status , Patient's Chart, lab work & pertinent test results  History of Anesthesia Complications Negative for: history of anesthetic complications  Airway Mallampati: III  TM Distance: >3 FB Neck ROM: full    Dental  (+) Poor Dentition, Chipped   Pulmonary neg shortness of breath, Current Smoker,    Pulmonary exam normal breath sounds clear to auscultation       Cardiovascular Exercise Tolerance: Good hypertension, (-) angina(-) Past MI and (-) DOE Normal cardiovascular exam Rhythm:regular Rate:Normal     Neuro/Psych negative neurological ROS  negative psych ROS   GI/Hepatic Neg liver ROS, GERD  Controlled,  Endo/Other  negative endocrine ROS  Renal/GU      Musculoskeletal   Abdominal   Peds  Hematology negative hematology ROS (+)   Anesthesia Other Findings Signs and symptoms suggestive of sleep apnea   Past Medical History: No date: Allergy No date: Anemia     Comment: h/o No date: Anxiety 02/19/2016: Bronchitis     Comment: ON LEVAQUIN PO No date: GERD (gastroesophageal reflux disease) No date: History of kidney stones No date: Hyperlipidemia No date: Hypertension  Past Surgical History: No date: ABDOMINAL HYSTERECTOMY No date: ABDOMINAL HYSTERECTOMY 2012: BREAST BIOPSY Bilateral 08-07-12: BREAST BIOPSY Right     Comment: fibroadenomatous changes and columnar cells 02/03/2015: BREAST BIOPSY     Comment: stereo byrnett No date: JOINT REPLACEMENT Left 04/15/2015: KNEE CLOSED REDUCTION Left     Comment: Procedure: CLOSED MANIPULATION KNEE;  Surgeon:              Thornton Park, MD;  Location: [facility];                Service: Orthopedics;  Laterality: Left; No date: KNEE SURGERY 12/25/2014: TOTAL KNEE ARTHROPLASTY Left     Comment: Procedure: TOTAL KNEE ARTHROPLASTY;   Surgeon:               Thornton Park, MD;  Location: [facility];                Service: Orthopedics;  Laterality: Left; No date: TUBAL LIGATION  BMI    Body Mass Index:  39.31 kg/m      Reproductive/Obstetrics negative OB ROS                             Anesthesia Physical Anesthesia Plan  ASA: III  Anesthesia Plan: General ETT   Post-op Pain Management:    Induction:   Airway Management Planned:   Additional Equipment:   Intra-op Plan:   Post-operative Plan:   Informed Consent: I have reviewed the patients History and Physical, chart, labs and discussed the procedure including the risks, benefits and alternatives for the proposed anesthesia with the patient or authorized representative who has indicated his/her understanding and acceptance.   Dental Advisory Given  Plan Discussed with: Anesthesiologist, CRNA and Surgeon  Anesthesia Plan Comments:         Anesthesia Quick Evaluation

## 2016-02-27 NOTE — Anesthesia Postprocedure Evaluation (Signed)
Anesthesia Post Note  Patient: Leslie [name]  Procedure(s) Performed: Procedure(s) (LRB): LAPAROSCOPIC CHOLECYSTECTOMY (N/A)  Patient location during evaluation: PACU Anesthesia Type: General Level of consciousness: awake and alert Pain management: pain level controlled Vital Signs Assessment: post-procedure vital signs reviewed and stable Respiratory status: spontaneous breathing, nonlabored ventilation, respiratory function stable and patient connected to nasal cannula oxygen Cardiovascular status: blood pressure returned to baseline and stable Postop Assessment: no signs of nausea or vomiting Anesthetic complications: no     Last Vitals:  Vitals:   02/27/16 1317 02/27/16 1326  BP: (!) 113/91 (!) 169/77  Pulse: 98 (!) 106  Resp: 14 16  Temp: 36.1 C (!) 35.9 C    Last Pain:  Vitals:   02/27/16 1326  TempSrc: Temporal  PainSc: 5                  Precious Haws Caroly Purewal

## 2016-02-27 NOTE — Interval H&P Note (Signed)
History and Physical Interval Note:  02/27/2016 9:55 AM  Leslie [name]  has presented today for surgery, with the diagnosis of cholilithiasis  The various methods of treatment have been discussed with the patient and family. After consideration of risks, benefits and other options for treatment, the patient has consented to  Procedure(s): LAPAROSCOPIC CHOLECYSTECTOMY WITH INTRAOPERATIVE CHOLANGIOGRAM (N/A) as a surgical intervention .  The patient's history has been reviewed, patient examined, no change in status, stable for surgery.  I have reviewed the patient's chart and labs.  Questions were answered to the patient's satisfaction.     SANKAR,SEEPLAPUTHUR G

## 2016-03-01 LAB — SURGICAL PATHOLOGY

## 2016-03-04 ENCOUNTER — Encounter: Payer: Self-pay | Admitting: General Surgery

## 2016-03-04 ENCOUNTER — Ambulatory Visit (INDEPENDENT_AMBULATORY_CARE_PROVIDER_SITE_OTHER): Payer: 59 | Admitting: General Surgery

## 2016-03-04 VITALS — BP 152/78 | HR 114 | Resp 14 | Ht 64.0 in | Wt 225.0 lb

## 2016-03-04 DIAGNOSIS — K8010 Calculus of gallbladder with chronic cholecystitis without obstruction: Secondary | ICD-10-CM

## 2016-03-04 NOTE — Progress Notes (Signed)
Patient ID: Leslie [name], female   DOB: June 19, 1965, 51 y.o.   MRN: [id number]  Chief Complaint  Patient presents with  . Routine Post Op    HPI Leslie [name] is a 51 y.o. female.  Here today for postoperative visit, she states she is doing well. Denies any gastrointestinal issues, bowels are moving regular. I have reviewed the history of present illness with the patient.  HPI  Past Medical History:  Diagnosis Date  . Allergy   . Anemia    h/o  . Anxiety   . Bronchitis 02/19/2016   ON LEVAQUIN PO  . GERD (gastroesophageal reflux disease)   . History of kidney stones   . Hyperlipidemia   . Hypertension     Past Surgical History:  Procedure Laterality Date  . ABDOMINAL HYSTERECTOMY    . ABDOMINAL HYSTERECTOMY    . BREAST BIOPSY Bilateral 2012  . BREAST BIOPSY Right 08-07-12   fibroadenomatous changes and columnar cells  . BREAST BIOPSY  02/03/2015   stereo byrnett  . CHOLECYSTECTOMY N/A 02/27/2016   Procedure: LAPAROSCOPIC CHOLECYSTECTOMY;  Surgeon: Christene Lye, MD;  Location: [facility];  Service: General;  Laterality: N/A;  . JOINT REPLACEMENT Left   . KNEE CLOSED REDUCTION Left 04/15/2015   Procedure: CLOSED MANIPULATION KNEE;  Surgeon: Thornton Park, MD;  Location: [facility];  Service: Orthopedics;  Laterality: Left;  . KNEE SURGERY    . TOTAL KNEE ARTHROPLASTY Left 12/25/2014   Procedure: TOTAL KNEE ARTHROPLASTY;  Surgeon: Thornton Park, MD;  Location: [facility];  Service: Orthopedics;  Laterality: Left;  . TUBAL LIGATION      Family History  Problem Relation Age of Onset  . Cancer Mother 15    breast  . Cancer Maternal Aunt     breast  . Cancer Maternal Grandmother     breast    Social History Social History  Substance Use Topics  . Smoking status: Current Every Day Smoker    Packs/day: 0.50    Years: 20.00    Types: Cigarettes  . Smokeless tobacco: Never Used  . Alcohol use 1.2 oz/week    2 Glasses of wine per week     Comment: wine occ     Allergies  Allergen Reactions  . Oxycodone Hcl Itching  . Dilaudid [Hydromorphone Hcl] Itching  . Morphine And Related Hives    Current Outpatient Prescriptions  Medication Sig Dispense Refill  . acetaminophen (TYLENOL) 500 MG tablet Take 500 mg by mouth every 6 (six) hours as needed for mild pain, moderate pain or headache.    . albuterol (PROVENTIL HFA;VENTOLIN HFA) 108 (90 BASE) MCG/ACT inhaler Inhale 2 puffs into the lungs every 6 (six) hours as needed for wheezing or shortness of breath.    . ALPRAZolam (XANAX) 0.5 MG tablet Take 0.5 mg by mouth 2 (two) times daily as needed for anxiety.    Marland Kitchen atorvastatin (LIPITOR) 20 MG tablet Take 20 mg by mouth daily at 6 PM.     . cetirizine (ZYRTEC) 10 MG tablet Take 10 mg by mouth at bedtime.    Marland Kitchen estradiol (ESTRACE) 0.5 MG tablet Take 0.5 mg by mouth daily.    . fluticasone (FLONASE) 50 MCG/ACT nasal spray Place 2 sprays into both nostrils daily as needed for allergies or rhinitis.    . hydrochlorothiazide (HYDRODIURIL) 25 MG tablet Take 25 mg by mouth daily.     Marland Kitchen HYDROcodone-acetaminophen (NORCO/VICODIN) 5-325 MG tablet Take 1 tablet by mouth every 4 (four) hours  as needed for moderate pain. 30 tablet 0  . lansoprazole (PREVACID) 30 MG capsule Take 30 mg by mouth every morning.     Marland Kitchen levofloxacin (LEVAQUIN) 500 MG tablet Take 500 mg by mouth daily.    . montelukast (SINGULAIR) 10 MG tablet Take 10 mg by mouth at bedtime.    . Multiple Vitamin (MULTIVITAMIN) capsule Take 1 capsule by mouth daily.     No current facility-administered medications for this visit.     Review of Systems Review of Systems  Constitutional: Negative.   Respiratory: Negative.   Cardiovascular: Negative.     Blood pressure (!) 152/78, pulse (!) 114, resp. rate 14, height 5\' 4"  (1.626 m), weight 225 lb (102.1 kg).  Physical Exam Physical Exam  Constitutional: She is oriented to person, place, and time. She appears well-developed and well-nourished.   Cardiovascular: Normal rate, regular rhythm and normal heart sounds.   Pulmonary/Chest: Effort normal and breath sounds normal.  Abdominal: Soft. Normal appearance and bowel sounds are normal. There is no tenderness.  abdominal port sites clean  Neurological: She is alert and oriented to person, place, and time.  Skin: Skin is warm and dry.  Psychiatric: Her behavior is normal.    Data Reviewed  Prior notes Pathology- chronic cholecystitis cholelithiasis Assessment    Stable postoperative exam. Chronic cholecystitis and cholelithiasis plus moderate adhesions of the omentum to the abdominal wall.    Plan    Follow up in December as scheduled for mammogram follow up.     This information has been scribed by Karie Fetch RN, BSN,BC.   Althea Backs G 03/08/2016, 8:41 AM

## 2016-03-04 NOTE — Patient Instructions (Addendum)
The patient is aware to call back for any questions or concerns. Follow up in December as scheduled for mammogram follow up.

## 2016-08-23 ENCOUNTER — Ambulatory Visit
Admission: RE | Admit: 2016-08-23 | Discharge: 2016-08-23 | Disposition: A | Discharge status: Home / Self Care | Payer: 59 | Source: Ambulatory Visit | Attending: Nurse Practitioner | Admitting: Nurse Practitioner

## 2016-08-23 ENCOUNTER — Other Ambulatory Visit: Payer: Self-pay | Admitting: Nurse Practitioner

## 2016-08-23 DIAGNOSIS — M5137 Other intervertebral disc degeneration, lumbosacral region: Secondary | ICD-10-CM | POA: Insufficient documentation

## 2016-08-23 DIAGNOSIS — M545 Low back pain: Secondary | ICD-10-CM | POA: Diagnosis not present

## 2016-08-23 DIAGNOSIS — M5136 Other intervertebral disc degeneration, lumbar region: Secondary | ICD-10-CM | POA: Diagnosis not present

## 2016-08-23 DIAGNOSIS — M549 Dorsalgia, unspecified: Secondary | ICD-10-CM

## 2016-12-30 ENCOUNTER — Ambulatory Visit (INDEPENDENT_AMBULATORY_CARE_PROVIDER_SITE_OTHER): Payer: 59 | Admitting: Nurse Practitioner

## 2016-12-30 ENCOUNTER — Encounter: Payer: Self-pay | Admitting: Nurse Practitioner

## 2016-12-30 VITALS — BP 134/72 | HR 96 | Temp 98.1°F | Resp 16 | Ht 64.0 in | Wt 204.0 lb

## 2016-12-30 DIAGNOSIS — B379 Candidiasis, unspecified: Secondary | ICD-10-CM | POA: Diagnosis not present

## 2016-12-30 DIAGNOSIS — J029 Acute pharyngitis, unspecified: Secondary | ICD-10-CM | POA: Diagnosis not present

## 2016-12-30 DIAGNOSIS — J028 Acute pharyngitis due to other specified organisms: Secondary | ICD-10-CM

## 2016-12-30 LAB — POCT RAPID STREP A (OFFICE): Rapid Strep A Screen: NEGATIVE

## 2016-12-30 MED ORDER — LEVOFLOXACIN 500 MG PO TABS: 500.0000 mg | ORAL_TABLET | Freq: Every day | ORAL | 0 refills | Status: DC

## 2016-12-30 MED ORDER — FLUCONAZOLE 150 MG PO TABS: 150.0000 mg | ORAL_TABLET | Freq: Once | ORAL | 0 refills | Status: DC

## 2016-12-30 NOTE — Progress Notes (Signed)
Subjective:     Patient ID: Leslie [name], female   DOB: [birth year], 51 y.o.   MRN: [id number]  Patient has sore throat, headache, neck pain, earache, and nasal congestion. Has been going on for about 5 days. Has had hard time getting off work, so waited to come to doctor. Has been taking mucinex and tylenol to helo symptoms. States that she has also had to use her rescue inhaler to help cough and slight wheezing.     Review of Systems  Constitutional: Positive for chills.  HENT: Positive for congestion, ear pain, postnasal drip, rhinorrhea, sinus pain, sore throat and voice change.   Respiratory: Positive for cough and wheezing.   Cardiovascular: Negative.   Gastrointestinal: Negative.   Endocrine: Negative.   Musculoskeletal: Negative.   Skin: Negative.   Allergic/Immunologic: Negative.   Neurological: Positive for headaches.  Psychiatric/Behavioral: Negative.        Objective:   Physical Exam     Assessment:     Acute pharyngitis due to other specified organisms - Plan: levofloxacin (LEVAQUIN) 500 MG tablet  Sore throat - Plan: POCT rapid strep A     Plan:     1. Acute pharyngitis  - strep negative. Treat with levofloxacin 500mg  daily for 10 days. Gargle with warm salt water as needed. Use OTC meds to treat symptoms.  2. Sore throat - samples cepacol throat lozenges given. Use as needed and as directed  3. Candidiasis - diflucan - use as needed and as prescribed

## 2017-01-03 ENCOUNTER — Ambulatory Visit: Payer: 59 | Admitting: General Surgery

## 2017-01-05 ENCOUNTER — Ambulatory Visit: Payer: 59 | Admitting: General Surgery

## 2017-01-21 ENCOUNTER — Other Ambulatory Visit: Payer: Self-pay | Admitting: Nurse Practitioner

## 2017-01-21 DIAGNOSIS — J028 Acute pharyngitis due to other specified organisms: Secondary | ICD-10-CM

## 2017-01-21 MED ORDER — LEVOFLOXACIN 500 MG PO TABS: 500.0000 mg | ORAL_TABLET | Freq: Every day | ORAL | 0 refills | Status: DC

## 2017-01-21 NOTE — Progress Notes (Signed)
Sent in prescription for levofloxacin daily for 10 days

## 2017-01-24 ENCOUNTER — Encounter: Payer: Self-pay | Admitting: General Surgery

## 2017-01-25 ENCOUNTER — Other Ambulatory Visit: Payer: Self-pay

## 2017-01-25 ENCOUNTER — Encounter: Payer: Self-pay | Admitting: General Surgery

## 2017-01-25 ENCOUNTER — Ambulatory Visit: Payer: 59 | Admitting: General Surgery

## 2017-01-25 VITALS — BP 176/74 | HR 116 | Resp 16 | Ht 64.0 in | Wt 242.0 lb

## 2017-01-25 DIAGNOSIS — Z803 Family history of malignant neoplasm of breast: Secondary | ICD-10-CM

## 2017-01-25 DIAGNOSIS — N6011 Diffuse cystic mastopathy of right breast: Secondary | ICD-10-CM

## 2017-01-25 MED ORDER — CETIRIZINE HCL 10 MG PO TABS: 10.0000 mg | ORAL_TABLET | Freq: Every day | ORAL | 0 refills | Status: DC

## 2017-01-25 NOTE — Progress Notes (Signed)
Patient ID: Leslie [name], female   DOB: 1965/01/25, 52 y.o.   MRN: [id number]  Chief Complaint  Patient presents with  . Follow-up    HPI Leslie [name] is a 52 y.o. female.  who presents for her follow up breast evaluation, prior patient of Dr Jamal Collin. The most recent mammogram was done on 01-21-17.  Patient does perform regular self breast checks and gets regular mammograms done.   No new breast issues. 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  HPI  Past Medical History:  Diagnosis Date  . Allergy   . Anemia    h/o  . Anxiety   . BRCA negative 03/22/2013  . Bronchitis 02/19/2016   ON LEVAQUIN PO  . GERD (gastroesophageal reflux disease)   . History of kidney stones   . Hyperlipidemia   . Hypertension     Past Surgical History:  Procedure Laterality Date  . ABDOMINAL HYSTERECTOMY     total  . ABDOMINAL HYSTERECTOMY    . BREAST BIOPSY Bilateral 2012  . BREAST BIOPSY Right 08-07-12   fibroadenomatous changes and columnar cells  . BREAST BIOPSY  02/03/2015   stereo Tabari Volkert  . CHOLECYSTECTOMY N/A 02/27/2016   Procedure: LAPAROSCOPIC CHOLECYSTECTOMY;  Surgeon: Christene Lye, MD;  Location: [facility] ORS;  Service: General;  Laterality: N/A;  . JOINT REPLACEMENT Left   . KNEE CLOSED REDUCTION Left 04/15/2015   Procedure: CLOSED MANIPULATION KNEE;  Surgeon: Thornton Park, MD;  Location: [facility] ORS;  Service: Orthopedics;  Laterality: Left;  . KNEE SURGERY    . TOTAL KNEE ARTHROPLASTY Left 12/25/2014   Procedure: TOTAL KNEE ARTHROPLASTY;  Surgeon: Thornton Park, MD;  Location: [facility] ORS;  Service: Orthopedics;  Laterality: Left;  . TUBAL LIGATION      Family History  Problem Relation Age of Onset  . Cancer Mother 5       breast  . Cancer Maternal Aunt        breast  . Cancer Maternal Grandmother        breast    Social History Social History   Tobacco Use  . Smoking status: Current Every Day Smoker    Packs/day: 0.50    Years: 20.00    Pack years: 10.00    Types:  Cigarettes  . Smokeless tobacco: Never Used  Substance Use Topics  . Alcohol use: Yes    Alcohol/week: 1.2 oz    Types: 2 Glasses of wine per week    Comment: wine occ  . Drug use: No    Allergies  Allergen Reactions  . Oxycodone Hcl Itching  . Dilaudid [Hydromorphone Hcl] Itching  . Morphine And Related Hives    Current Outpatient Medications  Medication Sig Dispense Refill  . acetaminophen (TYLENOL) 500 MG tablet Take 500 mg by mouth every 6 (six) hours as needed for mild pain, moderate pain or headache.    . albuterol (PROVENTIL HFA;VENTOLIN HFA) 108 (90 BASE) MCG/ACT inhaler Inhale 2 puffs into the lungs every 6 (six) hours as needed for wheezing or shortness of breath.    Marland Kitchen atorvastatin (LIPITOR) 20 MG tablet Take 20 mg by mouth daily at 6 PM.     . cetirizine (ZYRTEC) 10 MG tablet Take 1 tablet (10 mg total) by mouth daily. 90 tablet 0  . estradiol (ESTRACE) 0.5 MG tablet Take 0.5 mg by mouth daily.    . fluticasone (FLONASE) 50 MCG/ACT nasal spray Place 2 sprays into both nostrils daily as needed for allergies or rhinitis.    Marland Kitchen  hydrochlorothiazide (HYDRODIURIL) 25 MG tablet Take 25 mg by mouth daily.     . lansoprazole (PREVACID) 30 MG capsule Take 30 mg by mouth every morning.     . montelukast (SINGULAIR) 10 MG tablet Take 10 mg by mouth at bedtime.    . Multiple Vitamin (MULTIVITAMIN) capsule Take 1 capsule by mouth daily.     No current facility-administered medications for this visit.     Review of Systems Review of Systems  Constitutional: Negative.   Respiratory: Negative.   Cardiovascular: Negative.     Blood pressure (!) 176/74, pulse (!) 116, resp. rate 16, height '5\' 4"'  (1.626 m), weight 242 lb (109.8 kg).  Physical Exam Physical Exam  Constitutional: She is oriented to person, place, and time. She appears well-developed and well-nourished.  HENT:  Mouth/Throat: Oropharynx is clear and moist.  Eyes: Conjunctivae are normal. No scleral icterus.  Neck:  Neck supple.  Cardiovascular: Normal rate, regular rhythm and normal heart sounds.  Pulmonary/Chest: Breath sounds normal. Right breast exhibits no inverted nipple, no mass, no nipple discharge, no skin change and no tenderness. Left breast exhibits no inverted nipple, no mass, no nipple discharge, no skin change and no tenderness.  Lymphadenopathy:    She has no cervical adenopathy.    She has no axillary adenopathy.  Neurological: She is alert and oriented to person, place, and time.  Skin: Skin is warm and dry.  Psychiatric: Her behavior is normal.    Data Reviewed January 21, 2017 [facility] screening mammogram is reviewed.  Moderately dense breasts with scattered biopsy clips.  No dominant mass or areas of concerning microcalcifications.  BI-RADS-2.  Assessment    Leslie [name] family history of breast cancer.  Negative BRCA testing.    Plan    The patient continues to desire annual clinical exam through this office.     Patient will be asked to return to the office in one year with a bilateral screening mammogram.   HPI, Physical Exam, Assessment and Plan have been scribed under the direction and in the presence of Robert Bellow, MD. Karie Fetch, RN  I have completed the exam and reviewed the above documentation for accuracy and completeness.  I agree with the above.  Haematologist has been used and any errors in dictation or transcription are unintentional.  Hervey Ard, M.D., F.A.C.S.  Robert Bellow 01/25/2017, 5:12 PM

## 2017-01-25 NOTE — Patient Instructions (Signed)
The patient is aware to call back for any questions or concerns.  

## 2017-01-26 ENCOUNTER — Other Ambulatory Visit: Payer: Self-pay

## 2017-01-26 MED ORDER — FLUCONAZOLE 150 MG PO TABS: 150.0000 mg | ORAL_TABLET | Freq: Every day | ORAL | 1 refills | Status: DC

## 2017-02-15 ENCOUNTER — Other Ambulatory Visit: Payer: Self-pay | Admitting: Nurse Practitioner

## 2017-02-15 DIAGNOSIS — F411 Generalized anxiety disorder: Secondary | ICD-10-CM

## 2017-02-15 MED ORDER — ALPRAZOLAM 0.5 MG PO TBDP: 0.5000 mg | ORAL_TABLET | Freq: Two times a day (BID) | ORAL | 2 refills | Status: DC | PRN

## 2017-02-15 NOTE — Progress Notes (Signed)
Added alprazolam 0.5mg  BID as needed per IMS chart and pharmacy request for refill.

## 2017-02-17 ENCOUNTER — Encounter: Payer: Self-pay | Admitting: Nurse Practitioner

## 2017-02-17 ENCOUNTER — Ambulatory Visit: Payer: 59 | Admitting: Nurse Practitioner

## 2017-02-17 VITALS — BP 135/65 | HR 94 | Resp 16 | Ht 64.0 in | Wt 242.4 lb

## 2017-02-17 DIAGNOSIS — M25562 Pain in left knee: Secondary | ICD-10-CM | POA: Diagnosis not present

## 2017-02-17 DIAGNOSIS — Z96652 Presence of left artificial knee joint: Secondary | ICD-10-CM

## 2017-02-17 DIAGNOSIS — M545 Low back pain: Secondary | ICD-10-CM | POA: Diagnosis not present

## 2017-02-17 DIAGNOSIS — R05 Cough: Secondary | ICD-10-CM | POA: Diagnosis not present

## 2017-02-17 DIAGNOSIS — E559 Vitamin D deficiency, unspecified: Secondary | ICD-10-CM | POA: Diagnosis not present

## 2017-02-17 DIAGNOSIS — B3731 Acute candidiasis of vulva and vagina: Secondary | ICD-10-CM

## 2017-02-17 DIAGNOSIS — J069 Acute upper respiratory infection, unspecified: Secondary | ICD-10-CM | POA: Diagnosis not present

## 2017-02-17 DIAGNOSIS — B373 Candidiasis of vulva and vagina: Secondary | ICD-10-CM | POA: Diagnosis not present

## 2017-02-17 DIAGNOSIS — Z0001 Encounter for general adult medical examination with abnormal findings: Secondary | ICD-10-CM

## 2017-02-17 DIAGNOSIS — R059 Cough, unspecified: Secondary | ICD-10-CM

## 2017-02-17 DIAGNOSIS — G8929 Other chronic pain: Secondary | ICD-10-CM | POA: Diagnosis not present

## 2017-02-17 DIAGNOSIS — E039 Hypothyroidism, unspecified: Secondary | ICD-10-CM | POA: Diagnosis not present

## 2017-02-17 MED ORDER — HYDROCOD POLST-CPM POLST ER 10-8 MG/5ML PO SUER: 5.0000 mL | Freq: Two times a day (BID) | ORAL | 0 refills | Status: DC | PRN

## 2017-02-17 MED ORDER — FLUCONAZOLE 150 MG PO TABS: 150.0000 mg | ORAL_TABLET | Freq: Every day | ORAL | 1 refills | Status: DC

## 2017-02-17 MED ORDER — LEVOFLOXACIN 750 MG PO TABS: 750.0000 mg | ORAL_TABLET | Freq: Every day | ORAL | 0 refills | Status: DC

## 2017-02-17 NOTE — Progress Notes (Addendum)
[facility] [place], [place] [address]  Internal MEDICINE  Office Visit Note  Patient Name: Leslie [name]  [id number]  [id number]  Date of Service: 02/27/2017  Chief Complaint  Patient presents with  . Annual Exam     The patient is here for annual health maintenance exam. Today, she is c/o left knee pain and reduced ROM. She did have total knee replacement in 2016. Has not regained her ROM or strength since this surgery. She had another surgery a "manipulation" done without any progress. Went to physical therapy for over a year. Surgeon, here, did get her two second opinions, but she has not had the opportunity to seek a second opinion on her own.    Pt is here for routine health maintenance examination  Current Medication: Outpatient Encounter Medications as of 02/17/2017  Medication Sig  . acetaminophen (TYLENOL) 500 MG tablet Take 500 mg by mouth every 6 (six) hours as needed for mild pain, moderate pain or headache.  . albuterol (PROVENTIL HFA;VENTOLIN HFA) 108 (90 BASE) MCG/ACT inhaler Inhale 2 puffs into the lungs every 6 (six) hours as needed for wheezing or shortness of breath.  . ALPRAZolam (XANAX) 0.5 MG tablet Take 0.5 mg by mouth 2 (two) times daily as needed for anxiety. Take 1 tab po by twice a day as needed  For anxiety  . atorvastatin (LIPITOR) 20 MG tablet Take 20 mg by mouth daily at 6 PM.   . cetirizine (ZYRTEC) 10 MG tablet Take 1 tablet (10 mg total) by mouth daily.  Marland Kitchen estradiol (ESTRACE) 0.5 MG tablet Take 0.5 mg by mouth daily.  . fluconazole (DIFLUCAN) 150 MG tablet Take 1 tablet (150 mg total) by mouth daily. Take 1 tab by po once for 1 dose may repeat dose in 3 days for persistent symptoms  . fluticasone (FLONASE) 50 MCG/ACT nasal spray Place 2 sprays into both nostrils daily as needed for allergies or rhinitis.  . hydrochlorothiazide (HYDRODIURIL) 25 MG tablet Take 25 mg by mouth daily.   . lansoprazole (PREVACID) 30 MG capsule  Take 30 mg by mouth every morning.   . montelukast (SINGULAIR) 10 MG tablet Take 10 mg by mouth at bedtime.  . Multiple Vitamin (MULTIVITAMIN) capsule Take 1 capsule by mouth daily.  . [DISCONTINUED] fluconazole (DIFLUCAN) 150 MG tablet Take 1 tablet (150 mg total) by mouth daily. Take 1 tab by po once for 1 dose may repeat dose in 3 days for persistent symptoms  . chlorpheniramine-HYDROcodone (TUSSIONEX PENNKINETIC ER) 10-8 MG/5ML SUER Take 5 mLs by mouth every 12 (twelve) hours as needed for cough.  Marland Kitchen levofloxacin (LEVAQUIN) 750 MG tablet Take 1 tablet (750 mg total) by mouth daily.  . [DISCONTINUED] ALPRAZolam (NIRAVAM) 0.5 MG dissolvable tablet Take 1 tablet (0.5 mg total) by mouth 2 (two) times daily as needed for anxiety. (Patient not taking: Reported on 02/17/2017)   No facility-administered encounter medications on file as of 02/17/2017.     Surgical History: Past Surgical History:  Procedure Laterality Date  . ABDOMINAL HYSTERECTOMY     total  . ABDOMINAL HYSTERECTOMY    . BREAST BIOPSY Bilateral 2012  . BREAST BIOPSY Right 08-07-12   fibroadenomatous changes and columnar cells  . BREAST BIOPSY  02/03/2015   stereo byrnett  . CHOLECYSTECTOMY N/A 02/27/2016   Procedure: LAPAROSCOPIC CHOLECYSTECTOMY;  Surgeon: Christene Lye, MD;  Location: [facility] ORS;  Service: General;  Laterality: N/A;  . JOINT REPLACEMENT Left   . KNEE CLOSED  REDUCTION Left 04/15/2015   Procedure: CLOSED MANIPULATION KNEE;  Surgeon: Thornton Park, MD;  Location: [facility] ORS;  Service: Orthopedics;  Laterality: Left;  . KNEE SURGERY    . TOTAL KNEE ARTHROPLASTY Left 12/25/2014   Procedure: TOTAL KNEE ARTHROPLASTY;  Surgeon: Thornton Park, MD;  Location: [facility] ORS;  Service: Orthopedics;  Laterality: Left;  . TUBAL LIGATION      Medical History: Past Medical History:  Diagnosis Date  . Allergy   . Anemia    h/o  . Anxiety   . BRCA negative 03/22/2013  . Bronchitis 02/19/2016   ON LEVAQUIN PO  . GERD  (gastroesophageal reflux disease)   . History of kidney stones   . Hyperlipidemia   . Hypertension     Family History: Family History  Problem Relation Age of Onset  . Cancer Mother 45       breast  . Cancer Maternal Aunt        breast  . Cancer Maternal Grandmother        breast      Review of Systems  Constitutional: Negative for activity change, chills, fatigue and unexpected weight change.  HENT: Positive for congestion, postnasal drip and sore throat. Negative for rhinorrhea and sneezing.   Eyes: Negative.  Negative for redness.  Respiratory: Positive for cough. Negative for chest tightness, shortness of breath and wheezing.   Cardiovascular: Negative for chest pain and palpitations.  Gastrointestinal: Negative for abdominal pain, constipation, diarrhea, nausea and vomiting.  Endocrine: Negative for cold intolerance, heat intolerance, polydipsia, polyphagia and polyuria.  Genitourinary: Negative for dysuria and frequency.  Musculoskeletal: Positive for arthralgias. Negative for back pain, joint swelling and neck pain.  Skin: Negative for rash.  Allergic/Immunologic: Positive for environmental allergies.  Neurological: Positive for headaches. Negative for tremors and numbness.  Hematological: Negative for adenopathy. Does not bruise/bleed easily.  Psychiatric/Behavioral: Negative for behavioral problems (Depression), sleep disturbance and suicidal ideas. The patient is not nervous/anxious.      Today's Vitals   02/17/17 1417  BP: 135/65  Pulse: 94  Resp: 16  SpO2: 95%  Weight: 242 lb 6.4 oz (110 kg)  Height: '5\' 4"'  (1.626 m)    Physical Exam  Constitutional: She is oriented to person, place, and time. She appears well-developed and well-nourished. No distress.  HENT:  Head: Normocephalic and atraumatic.  Right Ear: Tympanic membrane is erythematous and bulging.  Left Ear: Tympanic membrane is erythematous and bulging.  Nose: Rhinorrhea present. Right sinus  exhibits frontal sinus tenderness. Left sinus exhibits frontal sinus tenderness.  Mouth/Throat: Oropharynx is clear and moist. No oropharyngeal exudate.  Post nasal drip evident  Eyes: EOM are normal. Pupils are equal, round, and reactive to light.  Neck: Normal range of motion. Neck supple. No JVD present. No tracheal deviation present. No thyromegaly present.  Cardiovascular: Normal rate, regular rhythm, normal heart sounds and intact distal pulses. Exam reveals no gallop and no friction rub.  No murmur heard. Pulmonary/Chest: Effort normal. No respiratory distress. She has wheezes. She has no rales. She exhibits no tenderness.  Abdominal: Soft. Bowel sounds are normal. There is no tenderness.  Musculoskeletal:       Legs: Lymphadenopathy:    She has cervical adenopathy.  Neurological: She is alert and oriented to person, place, and time. No cranial nerve deficit.  Skin: Skin is warm and dry. She is not diaphoretic.  Psychiatric: She has a normal mood and affect. Her behavior is normal. Judgment and thought content normal.  Nursing note  and vitals reviewed.    LABS: Recent Results (from the past 2160 hour(s))  POCT rapid strep A     Status: Normal   Collection Time: 12/30/16  5:27 PM  Result Value Ref Range   Rapid Strep A Screen Negative Negative  POCT Influenza A/B     Status: Normal   Collection Time: 02/22/17 11:03 AM  Result Value Ref Range   Influenza A, POC Negative Negative   Influenza B, POC Negative Negative  POCT rapid strep A     Status: Normal   Collection Time: 02/22/17 11:04 AM  Result Value Ref Range   Rapid Strep A Screen Negative Negative   Assessment/Plan: 1. Encounter for general adult medical examination with abnormal findings Annua lhealth maintenance exam performed today. - CBC with Differential/Platelet - Comprehensive metabolic panel - Lipid panel - Urinalysis, Routine w reflex microscopic  2. Acute upper respiratory infection - levofloxacin  (LEVAQUIN) 750 MG tablet; Take 1 tablet (750 mg total) by mouth daily.  Dispense: 10 tablet; Refill: 0  3. Cough - chlorpheniramine-HYDROcodone (TUSSIONEX PENNKINETIC ER) 10-8 MG/5ML SUER; Take 5 mLs by mouth every 12 (twelve) hours as needed for cough.  Dispense: 115 mL; Refill: 0  4. Hx of total knee arthroplasty, left Delayed healing. - Ambulatory referral to Orthopedic Surgery  5. Chronic pain of left knee - Ambulatory referral to Orthopedic Surgery  6. Vaginal candidiasis - fluconazole (DIFLUCAN) 150 MG tablet; Take 1 tablet (150 mg total) by mouth daily. Take 1 tab by po once for 1 dose may repeat dose in 3 days for persistent symptoms  Dispense: 3 tablet; Refill: 1  7. Low back pain, unspecified back pain laterality, unspecified chronicity, with sciatica presence unspecified - Urinalysis, Routine w reflex microscopic  8. Acquired hypothyroidism - T4, free - TSH  9. Vitamin D deficiency - Vitamin D 1,25 dihydroxy  General Counseling: Kanon verbalizes understanding of the findings of todays visit and agrees with plan of treatment. I have discussed any further diagnostic evaluation that may be needed or ordered today. We also reviewed her medications today. she has been encouraged to call the office with any questions or concerns that should arise related to todays visit.   This patient was seen by Leretha Pol, FNP- C in Collaboration with Dr Lavera Guise as a part of collaborative care agreement    Orders Placed This Encounter  Procedures  . CBC with Differential/Platelet  . Comprehensive metabolic panel  . T4, free  . TSH  . Lipid panel  . Vitamin D 1,25 dihydroxy  . Urinalysis, Routine w reflex microscopic  . Ambulatory referral to Orthopedic Surgery    Meds ordered this encounter  Medications  . fluconazole (DIFLUCAN) 150 MG tablet    Sig: Take 1 tablet (150 mg total) by mouth daily. Take 1 tab by po once for 1 dose may repeat dose in 3 days for persistent  symptoms    Dispense:  3 tablet    Refill:  1    Order Specific Question:   Supervising Provider    Answer:   Lavera Guise [facility]  . levofloxacin (LEVAQUIN) 750 MG tablet    Sig: Take 1 tablet (750 mg total) by mouth daily.    Dispense:  10 tablet    Refill:  0    Order Specific Question:   Supervising Provider    Answer:   Lavera Guise [[id number]]  . chlorpheniramine-HYDROcodone (TUSSIONEX PENNKINETIC ER) 10-8 MG/5ML SUER    Sig: Take  5 mLs by mouth every 12 (twelve) hours as needed for cough.    Dispense:  115 mL    Refill:  0    Order Specific Question:   Supervising Provider    Answer:   Lavera Guise [[id number]]    Time spent: [place], MD  Internal Medicine

## 2017-02-18 ENCOUNTER — Telehealth: Payer: Self-pay

## 2017-02-18 NOTE — Telephone Encounter (Signed)
LEFT MESSAGE ADVISING PT OF REFER APPT FOR [facility] 03/02/17  1:45

## 2017-02-22 ENCOUNTER — Ambulatory Visit: Payer: Self-pay | Admitting: Nurse Practitioner

## 2017-02-22 ENCOUNTER — Encounter: Payer: Self-pay | Admitting: Nurse Practitioner

## 2017-02-22 VITALS — BP 155/80 | HR 103 | Temp 98.4°F | Resp 16 | Ht 64.0 in | Wt 236.8 lb

## 2017-02-22 DIAGNOSIS — J029 Acute pharyngitis, unspecified: Secondary | ICD-10-CM

## 2017-02-22 DIAGNOSIS — K219 Gastro-esophageal reflux disease without esophagitis: Secondary | ICD-10-CM | POA: Diagnosis not present

## 2017-02-22 DIAGNOSIS — R6889 Other general symptoms and signs: Secondary | ICD-10-CM | POA: Diagnosis not present

## 2017-02-22 LAB — POCT INFLUENZA A/B
Influenza A, POC: NEGATIVE
Influenza B, POC: NEGATIVE

## 2017-02-22 LAB — POCT RAPID STREP A (OFFICE): Rapid Strep A Screen: NEGATIVE

## 2017-02-22 MED ORDER — FIRST-DUKES MOUTHWASH MT SUSP: OROMUCOSAL | 0 refills | Status: DC

## 2017-02-22 MED ORDER — AMOXICILLIN-POT CLAVULANATE 875-125 MG PO TABS: 1.0000 | ORAL_TABLET | Freq: Two times a day (BID) | ORAL | 0 refills | Status: DC

## 2017-02-22 MED ORDER — PREDNISONE 10 MG (21) PO TBPK: ORAL_TABLET | ORAL | 0 refills | Status: DC

## 2017-02-22 MED ORDER — OMEPRAZOLE 40 MG PO CPDR: 40.0000 mg | DELAYED_RELEASE_CAPSULE | Freq: Every day | ORAL | 3 refills | Status: DC

## 2017-02-22 NOTE — Progress Notes (Addendum)
[facility] [address] [place], [place] [address]  Internal MEDICINE  Office Visit Note  Patient Name: Leslie [name]  [id number]  [id number]  Date of Service: 02/22/2017  Chief Complaint  Patient presents with  . Sore Throat    bodyaches   . Sinusitis    neckpain  . Headache  . Chills     The patient was seen 131/2019, and treated on that day for bronchitis and wheezing. She was started o nlevofloxacin at that time. She has a few more days of this left. Using cepacol throat lozenges which does help the pain in her throat some. She is also taking aleve which has not helped at all.    Sore Throat   This is a recurrent problem. The current episode started in the past 7 days. The problem has been gradually worsening. The maximum temperature recorded prior to her arrival was 101 - 101.9 F. The fever has been present for less than 1 day. The pain is at a severity of 5/10. The pain is moderate. Associated symptoms include congestion, coughing, headaches, neck pain and trouble swallowing. Pertinent negatives include no ear pain. She has tried cool liquids for the symptoms. The treatment provided no relief.   Pt is here for a sick visit.     Current Medication:  Outpatient Encounter Medications as of 02/22/2017  Medication Sig  . acetaminophen (TYLENOL) 500 MG tablet Take 500 mg by mouth every 6 (six) hours as needed for mild pain, moderate pain or headache.  . albuterol (PROVENTIL HFA;VENTOLIN HFA) 108 (90 BASE) MCG/ACT inhaler Inhale 2 puffs into the lungs every 6 (six) hours as needed for wheezing or shortness of breath.  . ALPRAZolam (XANAX) 0.5 MG tablet Take 0.5 mg by mouth 2 (two) times daily as needed for anxiety. Take 1 tab po by twice a day as needed  For anxiety  . amoxicillin-clavulanate (AUGMENTIN) 875-125 MG tablet Take 1 tablet by mouth 2 (two) times daily.  Marland Kitchen atorvastatin (LIPITOR) 20 MG tablet Take 20 mg by mouth daily at 6 PM.   . cetirizine (ZYRTEC)  10 MG tablet Take 1 tablet (10 mg total) by mouth daily.  . chlorpheniramine-HYDROcodone (TUSSIONEX PENNKINETIC ER) 10-8 MG/5ML SUER Take 5 mLs by mouth every 12 (twelve) hours as needed for cough.  . Diphenhyd-Hydrocort-Nystatin (FIRST-DUKES MOUTHWASH) SUSP Swish and swallow with 45ms QID prn sore throat.  . estradiol (ESTRACE) 0.5 MG tablet Take 0.5 mg by mouth daily.  . fluconazole (DIFLUCAN) 150 MG tablet Take 1 tablet (150 mg total) by mouth daily. Take 1 tab by po once for 1 dose may repeat dose in 3 days for persistent symptoms  . fluticasone (FLONASE) 50 MCG/ACT nasal spray Place 2 sprays into both nostrils daily as needed for allergies or rhinitis.  . hydrochlorothiazide (HYDRODIURIL) 25 MG tablet Take 25 mg by mouth daily.   . lansoprazole (PREVACID) 30 MG capsule Take 30 mg by mouth every morning.   .Marland Kitchenlevofloxacin (LEVAQUIN) 750 MG tablet Take 1 tablet (750 mg total) by mouth daily.  . montelukast (SINGULAIR) 10 MG tablet Take 10 mg by mouth at bedtime.  . Multiple Vitamin (MULTIVITAMIN) capsule Take 1 capsule by mouth daily.  .Marland Kitchenomeprazole (PRILOSEC) 40 MG capsule Take 1 capsule (40 mg total) by mouth daily.  . predniSONE (STERAPRED UNI-PAK 21 TAB) 10 MG (21) TBPK tablet 6 day taper - take by mouth as directed for 6 days   No facility-administered encounter medications on file as of  02/22/2017.       Medical History: Past Medical History:  Diagnosis Date  . Allergy   . Anemia    h/o  . Anxiety   . BRCA negative 03/22/2013  . Bronchitis 02/19/2016   ON LEVAQUIN PO  . GERD (gastroesophageal reflux disease)   . History of kidney stones   . Hyperlipidemia   . Hypertension     Today's Vitals   02/22/17 1004  BP: (!) 155/80  Pulse: (!) 103  Resp: 16  Temp: 98.4 F (36.9 C)  SpO2: 96%  Weight: 236 lb 12.8 oz (107.4 kg)  Height: '5\' 4"'  (1.626 m)    Review of Systems  Constitutional: Positive for chills, fatigue and fever.  HENT: Positive for congestion, postnasal  drip, rhinorrhea, sinus pain, sore throat, trouble swallowing and voice change. Negative for ear pain.        Trouble swallowing.  Respiratory: Positive for cough. Negative for wheezing.   Cardiovascular: Negative.   Gastrointestinal: Negative for nausea.       Increased esophageal reflux. Unable to find prevacid OTC any longer.   Endocrine: Negative for cold intolerance, heat intolerance, polydipsia, polyphagia and polyuria.  Musculoskeletal: Positive for neck pain.  Skin: Negative.   Allergic/Immunologic: Positive for environmental allergies.  Neurological: Positive for headaches.  Hematological: Positive for adenopathy.  Psychiatric/Behavioral: Negative for agitation and behavioral problems. The patient is not nervous/anxious.     Physical Exam  Constitutional: She is oriented to person, place, and time. She appears well-developed and well-nourished. She has a sickly appearance.  HENT:  Head: Normocephalic and atraumatic.  Nose: Rhinorrhea present. Right sinus exhibits no maxillary sinus tenderness and no frontal sinus tenderness. Left sinus exhibits no maxillary sinus tenderness and no frontal sinus tenderness.  Mouth/Throat: Uvula is midline and mucous membranes are normal. Uvula swelling present. Posterior oropharyngeal edema and posterior oropharyngeal erythema present.  Eyes: Pupils are equal, round, and reactive to light.  Neck: Normal range of motion. Neck supple.  Cardiovascular: Normal rate and regular rhythm.  Pulmonary/Chest: Effort normal and breath sounds normal. She has no wheezes.  Dry, non-productive cough present.   Abdominal: Soft. Bowel sounds are normal. There is no tenderness.  Musculoskeletal: Normal range of motion.  Lymphadenopathy:    She has cervical adenopathy.  Neurological: She is alert and oriented to person, place, and time.  Skin: Skin is warm and dry.  Psychiatric: She has a normal mood and affect. Her behavior is normal. Judgment and thought  content normal.  Nursing note and vitals reviewed.   Assessment/Plan:  1. Sore throat - POCT rapid strep A - negative  - Diphenhyd-Hydrocort-Nystatin (FIRST-DUKES MOUTHWASH) SUSP; Swish and swallow with 81ms QID prn sore throat.  Dispense: 237 mL; Refill: 0  2. Flu-like symptoms - POCT Influenza A/B - negative for flu A and B  3. Acute pharyngitis, unspecified etiology - amoxicillin-clavulanate (AUGMENTIN) 875-125 MG tablet; Take 1 tablet by mouth 2 (two) times daily.  Dispense: 20 tablet; Refill: 0 - predniSONE (STERAPRED UNI-PAK 21 TAB) 10 MG (21) TBPK tablet; 6 day taper - take by mouth as directed for 6 days  Dispense: 21 tablet; Refill: 0  4. Gastroesophageal reflux disease without esophagitis - omeprazole (PRILOSEC) 40 MG capsule; Take 1 capsule (40 mg total) by mouth daily.  Dispense: 30 capsule; Refill: 3  General Counseling: Chrissi verbalizes understanding of the findings of todays visit and agrees with plan of treatment. I have discussed any further diagnostic evaluation that may be needed or ordered  today. We also reviewed her medications today. she has been encouraged to call the office with any questions or concerns that should arise related to todays visit.  This patient was seen by Leretha Pol, FNP- C in Collaboration with Dr Lavera Guise as a part of collaborative care agreement    Orders Placed This Encounter  Procedures  . POCT rapid strep A  . POCT Influenza A/B     Time spent: 15  Minutes

## 2017-03-18 ENCOUNTER — Other Ambulatory Visit: Payer: Self-pay

## 2017-03-18 MED ORDER — MONTELUKAST SODIUM 10 MG PO TABS: 10.0000 mg | ORAL_TABLET | Freq: Every day | ORAL | 1 refills | Status: DC

## 2017-03-21 ENCOUNTER — Other Ambulatory Visit: Payer: Self-pay | Admitting: Nurse Practitioner

## 2017-03-21 ENCOUNTER — Telehealth: Payer: Self-pay

## 2017-03-21 DIAGNOSIS — J029 Acute pharyngitis, unspecified: Secondary | ICD-10-CM

## 2017-03-21 DIAGNOSIS — J069 Acute upper respiratory infection, unspecified: Secondary | ICD-10-CM

## 2017-03-21 MED ORDER — PREDNISONE 10 MG (21) PO TBPK: ORAL_TABLET | ORAL | 0 refills | Status: DC

## 2017-03-21 MED ORDER — LEVOFLOXACIN 750 MG PO TABS: 750.0000 mg | ORAL_TABLET | Freq: Every day | ORAL | 0 refills | Status: DC

## 2017-03-21 NOTE — Progress Notes (Signed)
Levofloxacin 500mg  daily for 10 days and prednisone 6 day taper both sent to her pharmacy.

## 2017-03-21 NOTE — Telephone Encounter (Signed)
-----   Message from Ronnell Freshwater, NP sent at 03/21/2017  1:42 PM EST ----- Regarding: RE: pt sick and asking for abx Contact: [phone] Levofloxacin 500mg  daily for 10 days and prednisone 6 day taper both sent to her pharmacy. ----- Message ----- From: Edd Arbour, CMA Sent: 03/21/2017  12:01 PM To: Ronnell Freshwater, NP Subject: pt sick and asking for abx                     Pt called c/o bad cough, sore throat, ears ringing, sinus drainage, no fever, x 4 days.  Asking if something can be called into pharmacy.

## 2017-03-21 NOTE — Telephone Encounter (Signed)
Left vm that prescriptions sent to pharmacy.  dbs

## 2017-04-15 ENCOUNTER — Other Ambulatory Visit: Payer: Self-pay | Admitting: Nurse Practitioner

## 2017-04-15 ENCOUNTER — Other Ambulatory Visit: Payer: Self-pay

## 2017-04-15 MED ORDER — TIZANIDINE HCL 2 MG PO TABS: ORAL_TABLET | ORAL | 3 refills | Status: DC

## 2017-04-18 ENCOUNTER — Other Ambulatory Visit: Payer: Self-pay

## 2017-04-18 MED ORDER — CETIRIZINE HCL 10 MG PO TABS: 10.0000 mg | ORAL_TABLET | Freq: Every day | ORAL | 0 refills | Status: DC

## 2017-04-28 ENCOUNTER — Encounter: Payer: Self-pay | Admitting: Nurse Practitioner

## 2017-04-28 ENCOUNTER — Ambulatory Visit: Payer: BLUE CROSS/BLUE SHIELD | Admitting: Nurse Practitioner

## 2017-04-28 VITALS — BP 156/72 | HR 111 | Temp 97.8°F | Resp 16 | Ht 64.0 in | Wt 239.0 lb

## 2017-04-28 DIAGNOSIS — N390 Urinary tract infection, site not specified: Secondary | ICD-10-CM

## 2017-04-28 DIAGNOSIS — B373 Candidiasis of vulva and vagina: Secondary | ICD-10-CM

## 2017-04-28 DIAGNOSIS — R6889 Other general symptoms and signs: Secondary | ICD-10-CM | POA: Insufficient documentation

## 2017-04-28 DIAGNOSIS — R300 Dysuria: Secondary | ICD-10-CM | POA: Diagnosis not present

## 2017-04-28 DIAGNOSIS — R319 Hematuria, unspecified: Secondary | ICD-10-CM

## 2017-04-28 DIAGNOSIS — B3731 Acute candidiasis of vulva and vagina: Secondary | ICD-10-CM | POA: Insufficient documentation

## 2017-04-28 DIAGNOSIS — J069 Acute upper respiratory infection, unspecified: Secondary | ICD-10-CM | POA: Diagnosis not present

## 2017-04-28 LAB — POCT URINALYSIS DIPSTICK
Bilirubin, UA: NEGATIVE
Glucose, UA: NEGATIVE
Ketones, UA: NEGATIVE
Leukocytes, UA: NEGATIVE
Nitrite, UA: NEGATIVE
Protein, UA: NEGATIVE
Spec Grav, UA: <=1.005 — AB (ref 1.010–1.025)
Urobilinogen, UA: 0.2 E.U./dL
pH, UA: 8 (ref 5.0–8.0)

## 2017-04-28 LAB — POCT INFLUENZA A/B
Influenza A, POC: NEGATIVE
Influenza B, POC: NEGATIVE

## 2017-04-28 MED ORDER — LEVOFLOXACIN 750 MG PO TABS: 750.0000 mg | ORAL_TABLET | Freq: Every day | ORAL | 0 refills | Status: DC

## 2017-04-28 MED ORDER — FLUCONAZOLE 150 MG PO TABS: 150.0000 mg | ORAL_TABLET | Freq: Every day | ORAL | 1 refills | Status: DC

## 2017-04-28 NOTE — Progress Notes (Signed)
[facility] [place], [place] [address]  Internal MEDICINE  Office Visit Note  Patient Name: Leslie [name]  [id number]  [id number]  Date of Service: 04/28/2017  Chief Complaint  Patient presents with  . Cough    going on 4 days   . Sinusitis  . Urinary Tract Infection  . Back Pain  . Chills  . Headache     The patient is here for sick visit. She has nasal congestion, headache, cough, and wheezing for the last few days. States that her grandson had flu, diagnosed with flu.  She also has back pain, pain with urination, and blood in the urine. Has been going on for last several weeks. Did start taking OTC AZO which has helped some.   Pt is here for a sick visit.     Current Medication:  Outpatient Encounter Medications as of 04/28/2017  Medication Sig  . acetaminophen (TYLENOL) 500 MG tablet Take 500 mg by mouth every 6 (six) hours as needed for mild pain, moderate pain or headache.  . albuterol (PROVENTIL HFA;VENTOLIN HFA) 108 (90 BASE) MCG/ACT inhaler Inhale 2 puffs into the lungs every 6 (six) hours as needed for wheezing or shortness of breath.  . ALPRAZolam (XANAX) 0.5 MG tablet Take 0.5 mg by mouth 2 (two) times daily as needed for anxiety. Take 1 tab po by twice a day as needed  For anxiety  . amoxicillin-clavulanate (AUGMENTIN) 875-125 MG tablet Take 1 tablet by mouth 2 (two) times daily.  Marland Kitchen atorvastatin (LIPITOR) 20 MG tablet Take 20 mg by mouth daily at 6 PM.   . cetirizine (ZYRTEC) 10 MG tablet Take 1 tablet (10 mg total) by mouth daily.  . chlorpheniramine-HYDROcodone (TUSSIONEX PENNKINETIC ER) 10-8 MG/5ML SUER Take 5 mLs by mouth every 12 (twelve) hours as needed for cough.  . Diphenhyd-Hydrocort-Nystatin (FIRST-DUKES MOUTHWASH) SUSP Swish and swallow with 76ms QID prn sore throat.  . estradiol (ESTRACE) 0.5 MG tablet Take 0.5 mg by mouth daily.  . fluconazole (DIFLUCAN) 150 MG tablet Take 1 tablet (150 mg total) by mouth daily. Take 1  tab by po once for 1 dose may repeat dose in 3 days for persistent symptoms  . fluticasone (FLONASE) 50 MCG/ACT nasal spray Place 2 sprays into both nostrils daily as needed for allergies or rhinitis.  . hydrochlorothiazide (HYDRODIURIL) 25 MG tablet Take 25 mg by mouth daily.   . lansoprazole (PREVACID) 30 MG capsule Take 30 mg by mouth every morning.   .Marland Kitchenlevofloxacin (LEVAQUIN) 750 MG tablet Take 1 tablet (750 mg total) by mouth daily.  . montelukast (SINGULAIR) 10 MG tablet Take 1 tablet (10 mg total) by mouth at bedtime.  . Multiple Vitamin (MULTIVITAMIN) capsule Take 1 capsule by mouth daily.  .Marland Kitchenomeprazole (PRILOSEC) 40 MG capsule Take 1 capsule (40 mg total) by mouth daily.  . predniSONE (STERAPRED UNI-PAK 21 TAB) 10 MG (21) TBPK tablet 6 day taper - take by mouth as directed for 6 days (Patient not taking: Reported on 04/28/2017)  . tiZANidine (ZANAFLEX) 2 MG tablet TAKE 1 TABLET(S) BY MOUTH AT BEDTIME AS NEEDED FOR MUSCLE PAIN/SPASMS  . [DISCONTINUED] fluconazole (DIFLUCAN) 150 MG tablet Take 1 tablet (150 mg total) by mouth daily. Take 1 tab by po once for 1 dose may repeat dose in 3 days for persistent symptoms  . [DISCONTINUED] levofloxacin (LEVAQUIN) 750 MG tablet Take 1 tablet (750 mg total) by mouth daily.   No facility-administered encounter medications on file as of  04/28/2017.       Medical History: Past Medical History:  Diagnosis Date  . Allergy   . Anemia    h/o  . Anxiety   . BRCA negative 03/22/2013  . Bronchitis 02/19/2016   ON LEVAQUIN PO  . GERD (gastroesophageal reflux disease)   . History of kidney stones   . Hyperlipidemia   . Hypertension      Vital Signs: BP (!) 156/72 (BP Location: Left Arm, Patient Position: Sitting, Cuff Size: Normal)   Pulse (!) 111   Temp 97.8 F (36.6 C)   Resp 16   Ht '5\' 4"'  (1.626 m)   Wt 239 lb (108.4 kg)   SpO2 96%   BMI 41.02 kg/m    Review of Systems  Constitutional: Positive for chills and fatigue. Negative for  fever.  HENT: Positive for congestion, postnasal drip, rhinorrhea and sinus pain. Negative for ear pain, sore throat, trouble swallowing and voice change.        Trouble swallowing.  Eyes: Negative.   Respiratory: Positive for cough and wheezing.   Cardiovascular: Negative for chest pain and palpitations.       Blood pressure elevated   Gastrointestinal: Negative for nausea.       Decreased appetite  Endocrine: Negative for cold intolerance, heat intolerance, polydipsia, polyphagia and polyuria.  Genitourinary: Positive for flank pain, frequency and hematuria.  Musculoskeletal: Positive for neck pain.  Skin: Negative for rash.  Allergic/Immunologic: Positive for environmental allergies.  Neurological: Positive for headaches.  Hematological: Positive for adenopathy.  Psychiatric/Behavioral: Negative for agitation and behavioral problems. The patient is not nervous/anxious.     Physical Exam  Constitutional: She is oriented to person, place, and time. She appears well-developed and well-nourished. She has a sickly appearance.  HENT:  Head: Normocephalic and atraumatic.  Right Ear: There is swelling. Tympanic membrane is erythematous.  Left Ear: There is swelling. Tympanic membrane is erythematous.  Nose: Rhinorrhea present. Right sinus exhibits no maxillary sinus tenderness and no frontal sinus tenderness. Left sinus exhibits no maxillary sinus tenderness and no frontal sinus tenderness.  Mouth/Throat: Uvula is midline and mucous membranes are normal. Uvula swelling present. Posterior oropharyngeal edema and posterior oropharyngeal erythema present.  Eyes: Pupils are equal, round, and reactive to light.  Neck: Normal range of motion. Neck supple.  Cardiovascular: Normal rate and regular rhythm.  Pulmonary/Chest: Effort normal and breath sounds normal. She has no wheezes.  Dry, non-productive cough present.   Abdominal: Soft. Bowel sounds are normal. There is no tenderness.   Genitourinary:  Genitourinary Comments: urine sample is positive for moderato blood.   Musculoskeletal: Normal range of motion.  Lymphadenopathy:    She has cervical adenopathy.  Neurological: She is alert and oriented to person, place, and time.  Skin: Skin is warm and dry.  Psychiatric: She has a normal mood and affect. Her behavior is normal. Judgment and thought content normal.  Nursing note and vitals reviewed.  Assessment/Plan: 1. Urinary tract infection with hematuria, site unspecified Levofloxacin 576m daily to treat. Send urine for culture and sensitivity and adjust abx as indicated  - CULTURE, URINE COMPREHENSIVE  2. Acute upper respiratory infection Recommend use of OTC medication to alleviate symptoms. - levofloxacin (LEVAQUIN) 750 MG tablet; Take 1 tablet (750 mg total) by mouth daily.  Dispense: 10 tablet; Refill: 0  3. Flu-like symptoms - POCT Influenza A/B - negative. Treat for URI.   4. Dysuria - POCT Urinalysis Dipstick - positive for moderate blood. Treat for uti and  adjust abx as indicated   5. Vaginal candidiasis - fluconazole (DIFLUCAN) 150 MG tablet; Take 1 tablet (150 mg total) by mouth daily. Take 1 tab by po once for 1 dose may repeat dose in 3 days for persistent symptoms  Dispense: 3 tablet; Refill: 1   General Counseling: Elisama verbalizes understanding of the findings of todays visit and agrees with plan of treatment. I have discussed any further diagnostic evaluation that may be needed or ordered today. We also reviewed her medications today. she has been encouraged to call the office with any questions or concerns that should arise related to todays visit.   Rest and increase fluids. Continue using OTC medication to control symptoms.   This patient was seen by Leretha Pol, FNP- C in Collaboration with Dr Lavera Guise as a part of collaborative care agreement    Orders Placed This Encounter  Procedures  . CULTURE, URINE COMPREHENSIVE  .  POCT Urinalysis Dipstick  . POCT Influenza A/B    Meds ordered this encounter  Medications  . levofloxacin (LEVAQUIN) 750 MG tablet    Sig: Take 1 tablet (750 mg total) by mouth daily.    Dispense:  10 tablet    Refill:  0    Order Specific Question:   Supervising Provider    Answer:   Lavera Guise [[id number]]  . fluconazole (DIFLUCAN) 150 MG tablet    Sig: Take 1 tablet (150 mg total) by mouth daily. Take 1 tab by po once for 1 dose may repeat dose in 3 days for persistent symptoms    Dispense:  3 tablet    Refill:  1    Order Specific Question:   Supervising Provider    Answer:   Lavera Guise [[id number]]    Time spent: 25 Minutes

## 2017-04-30 LAB — CULTURE, URINE COMPREHENSIVE

## 2017-05-02 ENCOUNTER — Other Ambulatory Visit: Payer: Self-pay | Admitting: Nurse Practitioner

## 2017-05-02 ENCOUNTER — Telehealth: Payer: Self-pay

## 2017-05-02 ENCOUNTER — Other Ambulatory Visit: Payer: Self-pay

## 2017-05-02 DIAGNOSIS — J029 Acute pharyngitis, unspecified: Secondary | ICD-10-CM

## 2017-05-02 MED ORDER — ATORVASTATIN CALCIUM 20 MG PO TABS: 20.0000 mg | ORAL_TABLET | Freq: Every day | ORAL | 1 refills | Status: DC

## 2017-05-02 MED ORDER — PREDNISONE 10 MG (21) PO TBPK: ORAL_TABLET | ORAL | 0 refills | Status: DC

## 2017-05-02 NOTE — Telephone Encounter (Signed)
Added prednisone 6 day taper. Take as directed. Continue antibiotics. Rest and increase fluids. BRAT or bland diet recommended.

## 2017-05-02 NOTE — Progress Notes (Signed)
added prednisone 6 day taper to antibiotics. Bland/brat diet recommended along with rest and increased fluids.

## 2017-05-02 NOTE — Telephone Encounter (Signed)
Pt advised that we add prednisone and take rest see other reminder

## 2017-05-11 ENCOUNTER — Other Ambulatory Visit: Payer: Self-pay

## 2017-05-11 DIAGNOSIS — J069 Acute upper respiratory infection, unspecified: Secondary | ICD-10-CM

## 2017-05-11 MED ORDER — LEVOFLOXACIN 750 MG PO TABS: 750.0000 mg | ORAL_TABLET | Freq: Every day | ORAL | 0 refills | Status: DC

## 2017-05-11 NOTE — Telephone Encounter (Signed)
Pt called today she still not feeling better coughing  And sinusitis as per heather send levoflacin

## 2017-05-25 ENCOUNTER — Other Ambulatory Visit: Payer: Self-pay

## 2017-05-25 MED ORDER — HYDROCHLOROTHIAZIDE 25 MG PO TABS: 25.0000 mg | ORAL_TABLET | Freq: Every day | ORAL | 5 refills | Status: DC

## 2017-06-06 ENCOUNTER — Ambulatory Visit (INDEPENDENT_AMBULATORY_CARE_PROVIDER_SITE_OTHER): Payer: Self-pay | Admitting: Nurse Practitioner

## 2017-06-06 ENCOUNTER — Encounter: Payer: Self-pay | Admitting: Nurse Practitioner

## 2017-06-06 VITALS — BP 160/64 | HR 101 | Temp 97.3°F | Resp 16 | Ht 64.0 in | Wt 236.6 lb

## 2017-06-06 DIAGNOSIS — B3731 Acute candidiasis of vulva and vagina: Secondary | ICD-10-CM

## 2017-06-06 DIAGNOSIS — R059 Cough, unspecified: Secondary | ICD-10-CM | POA: Insufficient documentation

## 2017-06-06 DIAGNOSIS — R05 Cough: Secondary | ICD-10-CM | POA: Diagnosis not present

## 2017-06-06 DIAGNOSIS — B373 Candidiasis of vulva and vagina: Secondary | ICD-10-CM | POA: Diagnosis not present

## 2017-06-06 DIAGNOSIS — I10 Essential (primary) hypertension: Secondary | ICD-10-CM | POA: Insufficient documentation

## 2017-06-06 DIAGNOSIS — J029 Acute pharyngitis, unspecified: Secondary | ICD-10-CM

## 2017-06-06 DIAGNOSIS — J069 Acute upper respiratory infection, unspecified: Secondary | ICD-10-CM | POA: Diagnosis not present

## 2017-06-06 DIAGNOSIS — F17200 Nicotine dependence, unspecified, uncomplicated: Secondary | ICD-10-CM | POA: Insufficient documentation

## 2017-06-06 MED ORDER — PREDNISONE 10 MG (21) PO TBPK
ORAL_TABLET | ORAL | 0 refills | Status: DC
Start: 2017-06-06 — End: 2017-07-06

## 2017-06-06 MED ORDER — LEVOFLOXACIN 500 MG PO TABS: 500.0000 mg | ORAL_TABLET | Freq: Every day | ORAL | 0 refills | Status: DC

## 2017-06-06 MED ORDER — HYDROCOD POLST-CPM POLST ER 10-8 MG/5ML PO SUER: 5.0000 mL | Freq: Two times a day (BID) | ORAL | 0 refills | Status: DC | PRN

## 2017-06-06 MED ORDER — FLUCONAZOLE 150 MG PO TABS: 150.0000 mg | ORAL_TABLET | Freq: Every day | ORAL | 1 refills | Status: DC

## 2017-06-06 NOTE — Progress Notes (Signed)
[facility] [place], [place] [address]  Internal MEDICINE  Office Visit Note  Patient Name: Leslie [name]  [id number]  [id number]  Date of Service: 06/06/2017   Pt is here for a sick visit.  Chief Complaint  Patient presents with  . Sinusitis  . Cough  . Nasal Congestion     Sinusitis  This is a new problem. The current episode started in the past 7 days. The problem is unchanged. There has been no fever. Her pain is at a severity of 4/10. The pain is mild. Associated symptoms include chills, congestion, coughing, headaches, a hoarse voice, neck pain, shortness of breath, a sore throat and swollen glands. Pertinent negatives include no ear pain. Past treatments include acetaminophen. The treatment provided mild relief.  Cough  This is a recurrent problem. The current episode started in the past 7 days. The problem has been unchanged. The cough is productive of sputum. Associated symptoms include chills, headaches, nasal congestion, postnasal drip, rhinorrhea, a sore throat, shortness of breath and wheezing. Pertinent negatives include no chest pain, ear pain, fever or rash. She has tried OTC cough suppressant and rest for the symptoms. The treatment provided mild relief. Her past medical history is significant for environmental allergies.        Current Medication:  Outpatient Encounter Medications as of 06/06/2017  Medication Sig  . acetaminophen (TYLENOL) 500 MG tablet Take 500 mg by mouth every 6 (six) hours as needed for mild pain, moderate pain or headache.  . albuterol (PROVENTIL HFA;VENTOLIN HFA) 108 (90 BASE) MCG/ACT inhaler Inhale 2 puffs into the lungs every 6 (six) hours as needed for wheezing or shortness of breath.  . ALPRAZolam (XANAX) 0.5 MG tablet Take 0.5 mg by mouth 2 (two) times daily as needed for anxiety. Take 1 tab po by twice a day as needed  For anxiety  . amoxicillin-clavulanate (AUGMENTIN) 875-125 MG tablet Take 1 tablet by  mouth 2 (two) times daily.  Marland Kitchen atorvastatin (LIPITOR) 20 MG tablet Take 1 tablet (20 mg total) by mouth daily at 6 PM.  . cetirizine (ZYRTEC) 10 MG tablet Take 1 tablet (10 mg total) by mouth daily.  . chlorpheniramine-HYDROcodone (TUSSIONEX PENNKINETIC ER) 10-8 MG/5ML SUER Take 5 mLs by mouth every 12 (twelve) hours as needed for cough.  . Diphenhyd-Hydrocort-Nystatin (FIRST-DUKES MOUTHWASH) SUSP Swish and swallow with 8ms QID prn sore throat.  . estradiol (ESTRACE) 0.5 MG tablet Take 0.5 mg by mouth daily.  . fluconazole (DIFLUCAN) 150 MG tablet Take 1 tablet (150 mg total) by mouth daily. Take 1 tab by po once for 1 dose may repeat dose in 3 days for persistent symptoms  . fluticasone (FLONASE) 50 MCG/ACT nasal spray Place 2 sprays into both nostrils daily as needed for allergies or rhinitis.  . hydrochlorothiazide (HYDRODIURIL) 25 MG tablet Take 1 tablet (25 mg total) by mouth daily.  . lansoprazole (PREVACID) 30 MG capsule Take 30 mg by mouth every morning.   .Marland Kitchenlevofloxacin (LEVAQUIN) 500 MG tablet Take 1 tablet (500 mg total) by mouth daily.  . montelukast (SINGULAIR) 10 MG tablet Take 1 tablet (10 mg total) by mouth at bedtime.  . Multiple Vitamin (MULTIVITAMIN) capsule Take 1 capsule by mouth daily.  .Marland Kitchenomeprazole (PRILOSEC) 40 MG capsule Take 1 capsule (40 mg total) by mouth daily.  . predniSONE (STERAPRED UNI-PAK 21 TAB) 10 MG (21) TBPK tablet 6 day taper - take by mouth as directed for 6 days  . tiZANidine (ZANAFLEX)  2 MG tablet TAKE 1 TABLET(S) BY MOUTH AT BEDTIME AS NEEDED FOR MUSCLE PAIN/SPASMS  . [DISCONTINUED] chlorpheniramine-HYDROcodone (TUSSIONEX PENNKINETIC ER) 10-8 MG/5ML SUER Take 5 mLs by mouth every 12 (twelve) hours as needed for cough.  . [DISCONTINUED] fluconazole (DIFLUCAN) 150 MG tablet Take 1 tablet (150 mg total) by mouth daily. Take 1 tab by po once for 1 dose may repeat dose in 3 days for persistent symptoms  . [DISCONTINUED] levofloxacin (LEVAQUIN) 750 MG tablet  Take 1 tablet (750 mg total) by mouth daily.  . [DISCONTINUED] predniSONE (STERAPRED UNI-PAK 21 TAB) 10 MG (21) TBPK tablet 6 day taper - take by mouth as directed for 6 days   No facility-administered encounter medications on file as of 06/06/2017.       Medical History: Past Medical History:  Diagnosis Date  . Allergy   . Anemia    h/o  . Anxiety   . BRCA negative 03/22/2013  . Bronchitis 02/19/2016   ON LEVAQUIN PO  . GERD (gastroesophageal reflux disease)   . History of kidney stones   . Hyperlipidemia   . Hypertension     Today's Vitals   06/06/17 0944  BP: (!) 160/64  Pulse: (!) 101  Resp: 16  Temp: (!) 97.3 F (36.3 C)  TempSrc: Oral  SpO2: 96%  Weight: 236 lb 9.6 oz (107.3 kg)  Height: _0  (1.626 m)    Review of Systems  Constitutional: Positive for chills and fatigue. Negative for fever.  HENT: Positive for congestion, hoarse voice, postnasal drip, rhinorrhea, sinus pain and sore throat. Negative for ear pain, trouble swallowing and voice change.        Trouble swallowing.  Eyes: Negative.   Respiratory: Positive for cough, shortness of breath and wheezing.   Cardiovascular: Negative for chest pain and palpitations.       Blood pressure elevated   Gastrointestinal: Negative for nausea.       Decreased appetite. Still having palpable "knot" right upper quadrant of the abdomen. Feels sore. Doesn't hurt all the time. Hurts worse when she coughs.   Endocrine: Negative for cold intolerance, heat intolerance, polydipsia, polyphagia and polyuria.  Genitourinary: Positive for flank pain, frequency and hematuria.  Musculoskeletal: Positive for neck pain.  Skin: Negative for rash.  Allergic/Immunologic: Positive for environmental allergies.  Neurological: Positive for headaches.  Hematological: Positive for adenopathy.  Psychiatric/Behavioral: Negative for agitation and behavioral problems. The patient is not nervous/anxious.     Physical Exam   Constitutional: She is oriented to person, place, and time. She appears well-developed and well-nourished. She appears ill.  HENT:  Head: Normocephalic and atraumatic.  Right Ear: No swelling. Tympanic membrane is not erythematous.  Left Ear: No swelling. Tympanic membrane is not erythematous.  Nose: Rhinorrhea present. Right sinus exhibits no maxillary sinus tenderness and no frontal sinus tenderness. Left sinus exhibits no maxillary sinus tenderness and no frontal sinus tenderness.  Mouth/Throat: Uvula is midline and mucous membranes are normal. Uvula swelling present. Posterior oropharyngeal edema and posterior oropharyngeal erythema present.  Eyes: Pupils are equal, round, and reactive to light.  Neck: Normal range of motion. Neck supple.  Cardiovascular: Normal rate and regular rhythm.  Pulmonary/Chest: Effort normal. She has wheezes.  There is mild, expiratory wheezing heard throughout the lung fields .  Abdominal: Soft. Bowel sounds are normal. There is no tenderness.  Genitourinary:  Genitourinary Comments: urine sample is positive for moderato blood.   Musculoskeletal: Normal range of motion.  Lymphadenopathy:    She has  cervical adenopathy.  Neurological: She is alert and oriented to person, place, and time.  Skin: Skin is warm and dry.  Psychiatric: She has a normal mood and affect. Her behavior is normal. Judgment and thought content normal.  Nursing note and vitals reviewed.  Assessment/Plan:  1. Acute upper respiratory infection Levofloxacin 517m daily for 10 days. Continue with OTC medication to alleviate symptoms. Prednisone 151mdose pack. Take as directed for 6 days to relieve inflammation in lungs.  - levofloxacin (LEVAQUIN) 500 MG tablet; Take 1 tablet (500 mg total) by mouth daily.  Dispense: 10 tablet; Refill: 0 - predniSONE (STERAPRED UNI-PAK 21 TAB) 10 MG (21) TBPK tablet; 6 day taper - take by mouth as directed for 6 days  Dispense: 21 tablet; Refill: 0 2.  Cough tussionex cough suppressant may be taken twice daily as needed for cough. Should be taken in the evenings to avoid negative side effects . - chlorpheniramine-HYDROcodone (TUSSIONEX PENNKINETIC ER) 10-8 MG/5ML SUER; Take 5 mLs by mouth every 12 (twelve) hours as needed for cough.  Dispense: 115 mL; Refill: 0  3. Essential hypertension Generally stable. Continue BP medication as prescribed.   4. Vaginal candidiasis - fluconazole (DIFLUCAN) 150 MG tablet; Take 1 tablet (150 mg total) by mouth daily. Take 1 tab by po once for 1 dose may repeat dose in 3 days for persistent symptoms  Dispense: 3 tablet; Refill: 1  5. Tobacco dependency Discussion with patient regarding the benefits of smoking cessation. Will help to lessen severity and frequency of URI. She voiced understanding that she should quit smoking.     General Counseling: Krystie verbalizes understanding of the findings of todays visit and agrees with plan of treatment. I have discussed any further diagnostic evaluation that may be needed or ordered today. We also reviewed her medications today. she has been encouraged to call the office with any questions or concerns that should arise related to todays visit.  Rest and increase fluids. Continue using OTC medication to control symptoms.   This patient was seen by HeLeretha PolFNP- C in Collaboration with Dr FoLavera Guises a part of collaborative care agreement    Meds ordered this encounter  Medications  . predniSONE (STERAPRED UNI-PAK 21 TAB) 10 MG (21) TBPK tablet    Sig: 6 day taper - take by mouth as directed for 6 days    Dispense:  21 tablet    Refill:  0    Order Specific Question:   Supervising Provider    Answer:   KHLavera [email or web address]. levofloxacin (LEVAQUIN) 500 MG tablet    Sig: Take 1 tablet (500 mg total) by mouth daily.    Dispense:  10 tablet    Refill:  0    Order Specific Question:   Supervising Provider    Answer:   KHLavera Guise1[[id number]].  chlorpheniramine-HYDROcodone (TUSSIONEX PENNKINETIC ER) 10-8 MG/5ML SUER    Sig: Take 5 mLs by mouth every 12 (twelve) hours as needed for cough.    Dispense:  115 mL    Refill:  0    Order Specific Question:   Supervising Provider    Answer:   KHLavera Guise1[[id number]]. fluconazole (DIFLUCAN) 150 MG tablet    Sig: Take 1 tablet (150 mg total) by mouth daily. Take 1 tab by po once for 1 dose may repeat dose in 3 days for persistent symptoms    Dispense:  3 tablet  Refill:  1    Order Specific Question:   Supervising Provider    Answer:   Lavera Guise [[id number]]    Time spent: 15 Minutes

## 2017-06-14 ENCOUNTER — Other Ambulatory Visit: Payer: Self-pay | Admitting: Nurse Practitioner

## 2017-06-14 DIAGNOSIS — K219 Gastro-esophageal reflux disease without esophagitis: Secondary | ICD-10-CM

## 2017-06-14 MED ORDER — OMEPRAZOLE 40 MG PO CPDR: 40.0000 mg | DELAYED_RELEASE_CAPSULE | Freq: Every day | ORAL | 3 refills | Status: DC

## 2017-06-17 ENCOUNTER — Ambulatory Visit: Payer: Self-pay | Admitting: Nurse Practitioner

## 2017-07-06 ENCOUNTER — Other Ambulatory Visit: Payer: Self-pay

## 2017-07-06 DIAGNOSIS — J029 Acute pharyngitis, unspecified: Secondary | ICD-10-CM

## 2017-07-06 DIAGNOSIS — J069 Acute upper respiratory infection, unspecified: Secondary | ICD-10-CM

## 2017-07-06 MED ORDER — PREDNISONE 10 MG (21) PO TBPK: ORAL_TABLET | ORAL | 0 refills | Status: DC

## 2017-07-06 MED ORDER — LEVOFLOXACIN 500 MG PO TABS: 500.0000 mg | ORAL_TABLET | Freq: Every day | ORAL | 0 refills | Status: DC

## 2017-07-06 NOTE — Telephone Encounter (Signed)
Pt called that having bad cough ,sorethroat and sinus infection as per heather send levaquin and prednisone dose pak

## 2017-07-14 ENCOUNTER — Other Ambulatory Visit: Payer: Self-pay | Admitting: Nurse Practitioner

## 2017-07-14 MED ORDER — CETIRIZINE HCL 10 MG PO TABS: 10.0000 mg | ORAL_TABLET | Freq: Every day | ORAL | 3 refills | Status: DC

## 2017-08-01 ENCOUNTER — Other Ambulatory Visit: Payer: Self-pay

## 2017-08-01 MED ORDER — ESTRADIOL 0.5 MG PO TABS: 0.5000 mg | ORAL_TABLET | Freq: Every day | ORAL | 1 refills | Status: DC

## 2017-08-22 ENCOUNTER — Ambulatory Visit: Payer: BLUE CROSS/BLUE SHIELD | Admitting: Adult Health

## 2017-08-22 ENCOUNTER — Other Ambulatory Visit: Payer: Self-pay | Admitting: Nurse Practitioner

## 2017-08-22 ENCOUNTER — Encounter: Payer: Self-pay | Admitting: Nurse Practitioner

## 2017-08-22 VITALS — BP 151/71 | HR 92 | Resp 16 | Ht 64.0 in | Wt 240.2 lb

## 2017-08-22 DIAGNOSIS — I10 Essential (primary) hypertension: Secondary | ICD-10-CM | POA: Diagnosis not present

## 2017-08-22 DIAGNOSIS — R300 Dysuria: Secondary | ICD-10-CM | POA: Diagnosis not present

## 2017-08-22 DIAGNOSIS — F419 Anxiety disorder, unspecified: Secondary | ICD-10-CM

## 2017-08-22 DIAGNOSIS — R059 Cough, unspecified: Secondary | ICD-10-CM

## 2017-08-22 DIAGNOSIS — K219 Gastro-esophageal reflux disease without esophagitis: Secondary | ICD-10-CM

## 2017-08-22 DIAGNOSIS — R05 Cough: Secondary | ICD-10-CM

## 2017-08-22 DIAGNOSIS — B3731 Acute candidiasis of vulva and vagina: Secondary | ICD-10-CM

## 2017-08-22 DIAGNOSIS — B373 Candidiasis of vulva and vagina: Secondary | ICD-10-CM

## 2017-08-22 LAB — POCT URINALYSIS DIPSTICK
Bilirubin, UA: NEGATIVE
Blood, UA: NEGATIVE
Glucose, UA: NEGATIVE
Ketones, UA: NEGATIVE
Leukocytes, UA: NEGATIVE
Nitrite, UA: NEGATIVE
Protein, UA: POSITIVE — AB
Spec Grav, UA: <=1.005 — AB (ref 1.010–1.025)
Urobilinogen, UA: 0.2 E.U./dL
pH, UA: 7.5 (ref 5.0–8.0)

## 2017-08-22 MED ORDER — ALPRAZOLAM 0.5 MG PO TABS: 0.5000 mg | ORAL_TABLET | Freq: Every evening | ORAL | 0 refills | Status: DC | PRN

## 2017-08-22 MED ORDER — MONTELUKAST SODIUM 10 MG PO TABS: 10.0000 mg | ORAL_TABLET | Freq: Every day | ORAL | 2 refills | Status: DC

## 2017-08-22 MED ORDER — FLUCONAZOLE 150 MG PO TABS: 150.0000 mg | ORAL_TABLET | Freq: Every day | ORAL | 1 refills | Status: DC

## 2017-08-22 MED ORDER — HYDROCHLOROTHIAZIDE 25 MG PO TABS: 25.0000 mg | ORAL_TABLET | Freq: Every day | ORAL | 2 refills | Status: DC

## 2017-08-22 MED ORDER — ATORVASTATIN CALCIUM 20 MG PO TABS: 20.0000 mg | ORAL_TABLET | Freq: Every day | ORAL | 2 refills | Status: DC

## 2017-08-22 MED ORDER — NITROFURANTOIN MONOHYD MACRO 100 MG PO CAPS: 100.0000 mg | ORAL_CAPSULE | Freq: Two times a day (BID) | ORAL | 0 refills | Status: DC

## 2017-08-22 MED ORDER — OMEPRAZOLE 40 MG PO CPDR: 40.0000 mg | DELAYED_RELEASE_CAPSULE | Freq: Every day | ORAL | 2 refills | Status: DC

## 2017-08-22 MED ORDER — ESTRADIOL 0.5 MG PO TABS: 0.5000 mg | ORAL_TABLET | Freq: Every day | ORAL | 1 refills | Status: DC

## 2017-08-22 NOTE — Progress Notes (Signed)
[facility] [place], [place] [address]  Internal MEDICINE  Office Visit Note  Patient Name: Leslie [name]  [id number]  [id number]  Date of Service: 08/22/2017  Chief Complaint  Patient presents with  . Urinary Tract Infection    lower back oain, burning, itching,odor     HPI Pt is here for a sick visit.  She reports dysuria and bilateral flank pain x a couple weeks.  She repots some mild chills. Denies hematuria. She also reports coughing, and feeling "off".      Current Medication:  Outpatient Encounter Medications as of 08/22/2017  Medication Sig  . acetaminophen (TYLENOL) 500 MG tablet Take 500 mg by mouth every 6 (six) hours as needed for mild pain, moderate pain or headache.  . albuterol (PROVENTIL HFA;VENTOLIN HFA) 108 (90 BASE) MCG/ACT inhaler Inhale 2 puffs into the lungs every 6 (six) hours as needed for wheezing or shortness of breath.  . ALPRAZolam (XANAX) 0.5 MG tablet Take 0.5 mg by mouth 2 (two) times daily as needed for anxiety. Take 1 tab po by twice a day as needed  For anxiety  . amoxicillin-clavulanate (AUGMENTIN) 875-125 MG tablet Take 1 tablet by mouth 2 (two) times daily.  Marland Kitchen atorvastatin (LIPITOR) 20 MG tablet Take 1 tablet (20 mg total) by mouth daily at 6 PM.  . cetirizine (ZYRTEC) 10 MG tablet Take 1 tablet (10 mg total) by mouth daily.  . chlorpheniramine-HYDROcodone (TUSSIONEX PENNKINETIC ER) 10-8 MG/5ML SUER Take 5 mLs by mouth every 12 (twelve) hours as needed for cough.  . Diphenhyd-Hydrocort-Nystatin (FIRST-DUKES MOUTHWASH) SUSP Swish and swallow with 77ms QID prn sore throat.  . estradiol (ESTRACE) 0.5 MG tablet Take 1 tablet (0.5 mg total) by mouth daily.  . fluconazole (DIFLUCAN) 150 MG tablet Take 1 tablet (150 mg total) by mouth daily. Take 1 tab by po once for 1 dose may repeat dose in 3 days for persistent symptoms  . fluticasone (FLONASE) 50 MCG/ACT nasal spray Place 2 sprays into both nostrils daily as needed for  allergies or rhinitis.  . hydrochlorothiazide (HYDRODIURIL) 25 MG tablet Take 1 tablet (25 mg total) by mouth daily.  . lansoprazole (PREVACID) 30 MG capsule Take 30 mg by mouth every morning.   .Marland Kitchenlevofloxacin (LEVAQUIN) 500 MG tablet Take 1 tablet (500 mg total) by mouth daily.  . montelukast (SINGULAIR) 10 MG tablet Take 1 tablet (10 mg total) by mouth at bedtime.  . Multiple Vitamin (MULTIVITAMIN) capsule Take 1 capsule by mouth daily.  .Marland Kitchenomeprazole (PRILOSEC) 40 MG capsule Take 1 capsule (40 mg total) by mouth daily.  . predniSONE (STERAPRED UNI-PAK 21 TAB) 10 MG (21) TBPK tablet 6 day taper - take by mouth as directed for 6 days  . tiZANidine (ZANAFLEX) 2 MG tablet TAKE 1 TABLET(S) BY MOUTH AT BEDTIME AS NEEDED FOR MUSCLE PAIN/SPASMS   No facility-administered encounter medications on file as of 08/22/2017.       Medical History: Past Medical History:  Diagnosis Date  . Allergy   . Anemia    h/o  . Anxiety   . BRCA negative 03/22/2013  . Bronchitis 02/19/2016   ON LEVAQUIN PO  . GERD (gastroesophageal reflux disease)   . History of kidney stones   . Hyperlipidemia   . Hypertension      Vital Signs: BP (!) 151/71 (BP Location: Right Arm, Patient Position: Sitting, Cuff Size: Large)   Pulse 92   Resp 16   Ht 5' 4" (1.626 m)  Wt 240 lb 3.2 oz (109 kg)   SpO2 95%   BMI 41.23 kg/m    Review of Systems  Constitutional: Negative for chills, fatigue and unexpected weight change.  HENT: Negative for congestion, rhinorrhea, sneezing and sore throat.   Eyes: Negative for photophobia, pain and redness.  Respiratory: Negative for cough, chest tightness and shortness of breath.   Cardiovascular: Negative for chest pain and palpitations.  Gastrointestinal: Negative for abdominal pain, constipation, diarrhea, nausea and vomiting.  Endocrine: Negative.   Genitourinary: Negative for dysuria and frequency.  Musculoskeletal: Negative for arthralgias, back pain, joint swelling and  neck pain.  Skin: Negative for rash.  Allergic/Immunologic: Negative.   Neurological: Negative for tremors and numbness.  Hematological: Negative for adenopathy. Does not bruise/bleed easily.  Psychiatric/Behavioral: Negative for behavioral problems and sleep disturbance. The patient is not nervous/anxious.     Physical Exam  Constitutional: She is oriented to person, place, and time. She appears well-developed and well-nourished. No distress.  HENT:  Head: Normocephalic and atraumatic.  Mouth/Throat: Oropharynx is clear and moist. No oropharyngeal exudate.  Eyes: Pupils are equal, round, and reactive to light. EOM are normal.  Neck: Normal range of motion. Neck supple. No JVD present. No tracheal deviation present. No thyromegaly present.  Cardiovascular: Normal rate, regular rhythm and normal heart sounds. Exam reveals no gallop and no friction rub.  No murmur heard. Pulmonary/Chest: Effort normal and breath sounds normal. No respiratory distress. She has no wheezes. She has no rales. She exhibits no tenderness.  Abdominal: Soft. There is no tenderness. There is no guarding.  Musculoskeletal: Normal range of motion.  Lymphadenopathy:    She has no cervical adenopathy.  Neurological: She is alert and oriented to person, place, and time. No cranial nerve deficit.  Skin: Skin is warm and dry. She is not diaphoretic.  Psychiatric: She has a normal mood and affect. Her behavior is normal. Judgment and thought content normal.  Nursing note and vitals reviewed.  Assessment/Plan: 1. dysuria - POCT Urinalysis Dipstick  2. Essential hypertension Controlled.  Continue medications as directed.   3. Anxiety Refill medications.  - ALPRAZolam (XANAX) 0.5 MG tablet; Take 1 tablet (0.5 mg total) by mouth at bedtime as needed for anxiety.  Dispense: 30 tablet; Refill: 0  4. Vaginal candidiasis Take as needed for yeast infection  - fluconazole (DIFLUCAN) 150 MG tablet; Take 1 tablet (150 mg  total) by mouth daily. Take 1 tab by po once for 1 dose may repeat dose in 3 days for persistent symptoms  Dispense: 3 tablet; Refill: 1  General Counseling: Leslie [name] verbalizes understanding of the findings of todays visit and agrees with plan of treatment. I have discussed any further diagnostic evaluation that may be needed or ordered today. We also reviewed her medications today. she has been encouraged to call the office with any questions or concerns that should arise related to todays visit.   Orders Placed This Encounter  Procedures  . POCT Urinalysis Dipstick    No orders of the defined types were placed in this encounter.   Time spent:25 Minutes  This patient was seen by Orson Gear AGNP-C in Collaboration with Dr Lavera Guise as a part of collaborative care agreement

## 2017-08-22 NOTE — Patient Instructions (Signed)

## 2017-08-25 ENCOUNTER — Encounter: Payer: Self-pay | Admitting: Adult Health

## 2017-08-30 DIAGNOSIS — M1711 Unilateral primary osteoarthritis, right knee: Secondary | ICD-10-CM | POA: Diagnosis not present

## 2017-08-30 DIAGNOSIS — M25561 Pain in right knee: Secondary | ICD-10-CM | POA: Diagnosis not present

## 2017-09-08 ENCOUNTER — Encounter: Payer: Self-pay | Admitting: Nurse Practitioner

## 2017-09-08 ENCOUNTER — Ambulatory Visit
Admission: RE | Admit: 2017-09-08 | Discharge: 2017-09-08 | Disposition: A | Discharge status: Home / Self Care | Payer: BLUE CROSS/BLUE SHIELD | Source: Ambulatory Visit | Attending: Nurse Practitioner | Admitting: Nurse Practitioner

## 2017-09-08 ENCOUNTER — Other Ambulatory Visit: Payer: Self-pay | Admitting: Nurse Practitioner

## 2017-09-08 ENCOUNTER — Ambulatory Visit: Payer: BLUE CROSS/BLUE SHIELD | Admitting: Nurse Practitioner

## 2017-09-08 VITALS — BP 151/72 | HR 93 | Temp 98.1°F | Resp 16 | Ht 64.0 in | Wt 240.2 lb

## 2017-09-08 DIAGNOSIS — N390 Urinary tract infection, site not specified: Secondary | ICD-10-CM | POA: Diagnosis not present

## 2017-09-08 DIAGNOSIS — F339 Major depressive disorder, recurrent, unspecified: Secondary | ICD-10-CM | POA: Insufficient documentation

## 2017-09-08 DIAGNOSIS — R05 Cough: Secondary | ICD-10-CM

## 2017-09-08 DIAGNOSIS — R1084 Generalized abdominal pain: Secondary | ICD-10-CM | POA: Insufficient documentation

## 2017-09-08 DIAGNOSIS — R079 Chest pain, unspecified: Secondary | ICD-10-CM | POA: Diagnosis not present

## 2017-09-08 DIAGNOSIS — R062 Wheezing: Secondary | ICD-10-CM | POA: Diagnosis not present

## 2017-09-08 DIAGNOSIS — F17200 Nicotine dependence, unspecified, uncomplicated: Secondary | ICD-10-CM | POA: Diagnosis not present

## 2017-09-08 DIAGNOSIS — B373 Candidiasis of vulva and vagina: Secondary | ICD-10-CM

## 2017-09-08 DIAGNOSIS — J069 Acute upper respiratory infection, unspecified: Secondary | ICD-10-CM

## 2017-09-08 DIAGNOSIS — R059 Cough, unspecified: Secondary | ICD-10-CM

## 2017-09-08 DIAGNOSIS — R300 Dysuria: Secondary | ICD-10-CM | POA: Diagnosis not present

## 2017-09-08 DIAGNOSIS — B3731 Acute candidiasis of vulva and vagina: Secondary | ICD-10-CM

## 2017-09-08 LAB — POCT URINALYSIS DIPSTICK
Bilirubin, UA: NEGATIVE
Glucose, UA: NEGATIVE
Ketones, UA: NEGATIVE
Nitrite, UA: NEGATIVE
Protein, UA: POSITIVE — AB
Spec Grav, UA: 1.01 (ref 1.010–1.025)
Urobilinogen, UA: 0.2 E.U./dL
pH, UA: 7.5 (ref 5.0–8.0)

## 2017-09-08 MED ORDER — LEVOFLOXACIN 500 MG PO TABS: 500.0000 mg | ORAL_TABLET | Freq: Every day | ORAL | 0 refills | Status: DC

## 2017-09-08 MED ORDER — BUPROPION HCL ER (SR) 100 MG PO TB12: 100.0000 mg | ORAL_TABLET | Freq: Every day | ORAL | 3 refills | Status: DC

## 2017-09-08 MED ORDER — HYDROCOD POLST-CPM POLST ER 10-8 MG/5ML PO SUER: 5.0000 mL | Freq: Two times a day (BID) | ORAL | 0 refills | Status: DC | PRN

## 2017-09-08 MED ORDER — NYSTATIN 100000 UNIT/GM EX POWD: Freq: Four times a day (QID) | CUTANEOUS | 1 refills | Status: DC

## 2017-09-08 NOTE — Progress Notes (Signed)
[facility] [address] [place], [place] [address]  Internal MEDICINE  Office Visit Note  Patient Name: Leslie [name]  12/02/1965  [id number]  Date of Service: 09/08/2017   Pt is here for a sick visit.  Chief Complaint  Patient presents with  . Cough    been going on for about 3-4 days pt has taken OTC mucinex not working  . Wheezing    shortness of breath hurting in side  . Chills  . Fatigue  . Vaginal Itching    pt has been having itchinig nd burning symtoms stated she doesnt know if it is a yeast infection or not     The patient is c/o bilateral upper abdominal pain and right lower 'pelvic" pain which is intermittent, but persistent. Was seen about 2 weeks ago and was placed on macrobid. States that she never really felt better after that. Discussed getting ultrasound of abdomen if no better.   Cough  This is a recurrent problem. The current episode started in the past 7 days. The problem has been unchanged. The problem occurs hourly. The cough is non-productive. Associated symptoms include chills, headaches, nasal congestion, postnasal drip, rhinorrhea, a sore throat, shortness of breath and wheezing. Pertinent negatives include no chest pain, ear pain, fever or rash. The symptoms are aggravated by stress and fumes. Risk factors for lung disease include smoking/tobacco exposure. She has tried prescription cough suppressant and rest for the symptoms. The treatment provided mild relief. Her past medical history is significant for environmental allergies.        Current Medication:  Outpatient Encounter Medications as of 09/08/2017  Medication Sig  . acetaminophen (TYLENOL) 500 MG tablet Take 500 mg by mouth every 6 (six) hours as needed for mild pain, moderate pain or headache.  . albuterol (PROVENTIL HFA;VENTOLIN HFA) 108 (90 BASE) MCG/ACT inhaler Inhale 2 puffs into the lungs every 6 (six) hours as needed for wheezing or shortness of breath.  . ALPRAZolam  (XANAX) 0.5 MG tablet Take 0.5 mg by mouth 2 (two) times daily as needed for anxiety. Take 1 tab po by twice a day as needed  For anxiety  . atorvastatin (LIPITOR) 20 MG tablet Take 1 tablet (20 mg total) by mouth daily at 6 PM.  . cetirizine (ZYRTEC) 10 MG tablet Take 1 tablet (10 mg total) by mouth daily.  . chlorpheniramine-HYDROcodone (TUSSIONEX PENNKINETIC ER) 10-8 MG/5ML SUER Take 5 mLs by mouth every 12 (twelve) hours as needed for cough.  . Diphenhyd-Hydrocort-Nystatin (FIRST-DUKES MOUTHWASH) SUSP Swish and swallow with 5mls QID prn sore throat.  . estradiol (ESTRACE) 0.5 MG tablet Take 1 tablet (0.5 mg total) by mouth daily.  . fluconazole (DIFLUCAN) 150 MG tablet Take 1 tablet (150 mg total) by mouth daily. Take 1 tab by po once for 1 dose may repeat dose in 3 days for persistent symptoms  . fluticasone (FLONASE) 50 MCG/ACT nasal spray Place 2 sprays into both nostrils daily as needed for allergies or rhinitis.  . hydrochlorothiazide (HYDRODIURIL) 25 MG tablet Take 1 tablet (25 mg total) by mouth daily.  . lansoprazole (PREVACID) 30 MG capsule Take 30 mg by mouth every morning.   . levofloxacin (LEVAQUIN) 500 MG tablet Take 1 tablet (500 mg total) by mouth daily.  . montelukast (SINGULAIR) 10 MG tablet Take 1 tablet (10 mg total) by mouth at bedtime.  . Multiple Vitamin (MULTIVITAMIN) capsule Take 1 capsule by mouth daily.  . omeprazole (PRILOSEC) 40 MG capsule Take 1 capsule (  40 mg total) by mouth daily.  Marland Kitchen tiZANidine (ZANAFLEX) 2 MG tablet TAKE 1 TABLET(S) BY MOUTH AT BEDTIME AS NEEDED FOR MUSCLE PAIN/SPASMS  . [DISCONTINUED] ALPRAZolam (XANAX) 0.5 MG tablet Take 1 tablet (0.5 mg total) by mouth at bedtime as needed for anxiety.  . [DISCONTINUED] amoxicillin-clavulanate (AUGMENTIN) 875-125 MG tablet Take 1 tablet by mouth 2 (two) times daily.  . [DISCONTINUED] chlorpheniramine-HYDROcodone (TUSSIONEX PENNKINETIC ER) 10-8 MG/5ML SUER Take 5 mLs by mouth every 12 (twelve) hours as needed  for cough.  . [DISCONTINUED] levofloxacin (LEVAQUIN) 500 MG tablet Take 1 tablet (500 mg total) by mouth daily.  . [DISCONTINUED] nitrofurantoin, macrocrystal-monohydrate, (MACROBID) 100 MG capsule Take 1 capsule (100 mg total) by mouth 2 (two) times daily.  Marland Kitchen buPROPion (WELLBUTRIN SR) 100 MG 12 hr tablet Take 1 tablet (100 mg total) by mouth daily.  Marland Kitchen nystatin (MYCOSTATIN/NYSTOP) powder Apply topically 4 (four) times daily.  . [DISCONTINUED] predniSONE (STERAPRED UNI-PAK 21 TAB) 10 MG (21) TBPK tablet 6 day taper - take by mouth as directed for 6 days (Patient not taking: Reported on 09/08/2017)   No facility-administered encounter medications on file as of 09/08/2017.       Medical History: Past Medical History:  Diagnosis Date  . Allergy   . Anemia    h/o  . Anxiety   . BRCA negative 03/22/2013  . Bronchitis 02/19/2016   ON LEVAQUIN PO  . GERD (gastroesophageal reflux disease)   . History of kidney stones   . Hyperlipidemia   . Hypertension      Vital Signs: BP (!) 151/72 (BP Location: Left Arm, Patient Position: Sitting, Cuff Size: Large)   Pulse 93   Temp 98.1 F (36.7 C)   Resp 16   Ht 5' 4" (1.626 m)   Wt 240 lb 3.2 oz (109 kg)   SpO2 96%   BMI 41.23 kg/m    Review of Systems  Constitutional: Positive for chills and fatigue. Negative for fever.  HENT: Positive for congestion, postnasal drip, rhinorrhea, sinus pain and sore throat. Negative for ear pain, trouble swallowing and voice change.   Eyes: Negative.   Respiratory: Positive for cough, shortness of breath and wheezing.   Cardiovascular: Negative for chest pain and palpitations.       Blood pressure elevated   Gastrointestinal: Negative for nausea.       Decreased appetite. Tenderness on both sides of upper abdomen. Hurts when she takes a deep breath.   Endocrine: Negative for cold intolerance, heat intolerance, polydipsia, polyphagia and polyuria.  Genitourinary: Positive for flank pain and vaginal  discharge. Negative for frequency and hematuria.       Vaginal itching. Suprapubic pain  Musculoskeletal: Positive for back pain and neck pain.  Skin: Negative for rash.  Allergic/Immunologic: Positive for environmental allergies.  Neurological: Positive for headaches.  Hematological: Positive for adenopathy.  Psychiatric/Behavioral: Negative for agitation and behavioral problems. The patient is not nervous/anxious.     Physical Exam  Constitutional: She is oriented to person, place, and time. She appears well-developed and well-nourished. She appears ill.  HENT:  Head: Normocephalic and atraumatic.  Right Ear: No swelling. Tympanic membrane is not erythematous.  Left Ear: No swelling. Tympanic membrane is not erythematous.  Nose: Rhinorrhea present. Right sinus exhibits no maxillary sinus tenderness and no frontal sinus tenderness. Left sinus exhibits no maxillary sinus tenderness and no frontal sinus tenderness.  Mouth/Throat: Uvula is midline and mucous membranes are normal. Uvula swelling present. Posterior oropharyngeal edema and posterior oropharyngeal  erythema present.  Eyes: Pupils are equal, round, and reactive to light. Conjunctivae and EOM are normal.  Neck: Normal range of motion. Neck supple.  Cardiovascular: Normal rate and regular rhythm.  Pulmonary/Chest: Effort normal. She has wheezes.  Inspiratory and expiratory wheezes throughout the lung fields. Congested, non-productive cough is noted   Abdominal: Soft. Bowel sounds are normal. There is tenderness.  Tenderness with palpation across the upper left and right quadrants of the abdomen. There is also tenderness in right, suprapubic area lf the abdomen. No masses or abnormalities are felt today.   Genitourinary:  Genitourinary Comments: U/a positive for small WBC and trace blood. Protein is also positive  Musculoskeletal: Normal range of motion.  Lymphadenopathy:    She has cervical adenopathy.  Neurological: She is alert  and oriented to person, place, and time.  Skin: Skin is warm and dry.  Psychiatric: She has a normal mood and affect. Her behavior is normal. Judgment and thought content normal.  Nursing note and vitals reviewed.  Assessment/Plan: 1. Acute upper respiratory infection Start levofloxacin 58m daily for 10 days. Work note given as patient was unable to work entire day today. Will return to work tomorrow as scheduled  - levofloxacin (LEVAQUIN) 500 MG tablet; Take 1 tablet (500 mg total) by mouth daily.  Dispense: 10 tablet; Refill: 0  2. Cough Chest x-ray to evaluate further. Added back tussionex twice daily if needed for cough. Advised she use with caution due to possible drowsiness and fatigue.  - DG Chest 2 View; Future - chlorpheniramine-HYDROcodone (TUSSIONEX PENNKINETIC ER) 10-8 MG/5ML SUER; Take 5 mLs by mouth every 12 (twelve) hours as needed for cough.  Dispense: 115 mL; Refill: 0  3. Generalized abdominal pain Unclear etiology. Will get abdominal ultrasound for further evaluation.  - [place]domen Complete; Future  4. Urinary tract infection without hematuria, site unspecified Levofloxacin to cover UTI. Send urine for culture and sensitivity and adjust antibiotics as indicated.  - POCT Urinalysis Dipstick - CULTURE, URINE COMPREHENSIVE  5. Vaginal candidiasis Diflucan may be used as previously prescribed to treat yeast infection. Added topical nystatin ointment to use up to 4 times daily to releve symptoms of yeast infection  - nystatin (MYCOSTATIN/NYSTOP) powder; Apply topically 4 (four) times daily.  Dispense: 60 g; Refill: 1  6. Tobacco dependency Add bupropion SR 1041mevery morning to help with smoking cessation.  - buPROPion (WELLBUTRIN SR) 100 MG 12 hr tablet; Take 1 tablet (100 mg total) by mouth daily.  Dispense: 30 tablet; Refill: 3  General Counseling: Yulianna verbalizes understanding of the findings of todays visit and agrees with plan of treatment. I have discussed  any further diagnostic evaluation that may be needed or ordered today. We also reviewed her medications today. she has been encouraged to call the office with any questions or concerns that should arise related to todays visit.    Counseling:  Rest and increase fluids. Continue using OTC medication to control symptoms.   Smoking cessation counseling: 1. Pt acknowledges the risks of long term smoking, she will try to quite smoking. 2. Options for different medications including nicotine products, chewing gum, patch etc, Wellbutrin and Chantix is discussed 3. Goal and date of compete cessation is discussed 4. Total time spent in smoking cessation is 10 min.  This patient was seen by HeLeretha PolNP Collaboration with Dr FoLavera Guises a part of collaborative care agreement  Orders Placed This Encounter  Procedures  . CULTURE, URINE COMPREHENSIVE  . DG  Chest 2 View  . US Abdomen Complete  . POCT Urinalysis Dipstick    Meds ordered this encounter  Medications  . buPROPion (WELLBUTRIN SR) 100 MG 12 hr tablet    Sig: Take 1 tablet (100 mg total) by mouth daily.    Dispense:  30 tablet    Refill:  3    Order Specific Question:   Supervising Provider    Answer:   Lavera Guise [[id number]]  . chlorpheniramine-HYDROcodone (TUSSIONEX PENNKINETIC ER) 10-8 MG/5ML SUER    Sig: Take 5 mLs by mouth every 12 (twelve) hours as needed for cough.    Dispense:  115 mL    Refill:  0    Order Specific Question:   Supervising Provider    Answer:   Lavera Guise [[id number]]  . levofloxacin (LEVAQUIN) 500 MG tablet    Sig: Take 1 tablet (500 mg total) by mouth daily.    Dispense:  10 tablet    Refill:  0    Order Specific Question:   Supervising Provider    Answer:   Lavera Guise [[id number]]  . nystatin (MYCOSTATIN/NYSTOP) powder    Sig: Apply topically 4 (four) times daily.    Dispense:  60 g    Refill:  1    Order Specific Question:   Supervising Provider    Answer:   Lavera Guise [[id number]]    Time  spent: 25 Minutes

## 2017-09-09 ENCOUNTER — Telehealth: Payer: Self-pay

## 2017-09-09 ENCOUNTER — Other Ambulatory Visit: Payer: Self-pay

## 2017-09-09 MED ORDER — NYSTATIN 100000 UNIT/GM EX CREA: 1.0000 "application " | TOPICAL_CREAM | Freq: Three times a day (TID) | CUTANEOUS | 1 refills | Status: DC

## 2017-09-09 NOTE — Telephone Encounter (Signed)
-----   Message from Ronnell Freshwater, NP sent at 09/08/2017  4:03 PM EDT ----- Please let the patient know that her chest x-ray showed no pneumonia. thanks

## 2017-09-09 NOTE — Telephone Encounter (Signed)
Informed pt of xray results 

## 2017-09-09 NOTE — Telephone Encounter (Signed)
Lmom to pt that we change nystatin  Cream and send to [facility]

## 2017-09-11 ENCOUNTER — Inpatient Hospital Stay
Admission: EM | Admit: 2017-09-11 | Discharge: 2017-09-13 | DRG: 308 | Disposition: A | Discharge status: Home / Self Care | Payer: BLUE CROSS/BLUE SHIELD | Attending: Internal Medicine | Admitting: Internal Medicine

## 2017-09-11 ENCOUNTER — Encounter: Payer: Self-pay | Admitting: Emergency Medicine

## 2017-09-11 ENCOUNTER — Other Ambulatory Visit: Payer: Self-pay

## 2017-09-11 ENCOUNTER — Emergency Department: Payer: BLUE CROSS/BLUE SHIELD

## 2017-09-11 DIAGNOSIS — I959 Hypotension, unspecified: Secondary | ICD-10-CM | POA: Diagnosis not present

## 2017-09-11 DIAGNOSIS — Z79899 Other long term (current) drug therapy: Secondary | ICD-10-CM

## 2017-09-11 DIAGNOSIS — R402140 Coma scale, eyes open, spontaneous, unspecified time: Secondary | ICD-10-CM | POA: Diagnosis present

## 2017-09-11 DIAGNOSIS — Z885 Allergy status to narcotic agent status: Secondary | ICD-10-CM

## 2017-09-11 DIAGNOSIS — Z79890 Hormone replacement therapy (postmenopausal): Secondary | ICD-10-CM

## 2017-09-11 DIAGNOSIS — R402360 Coma scale, best motor response, obeys commands, unspecified time: Secondary | ICD-10-CM | POA: Diagnosis present

## 2017-09-11 DIAGNOSIS — I5031 Acute diastolic (congestive) heart failure: Secondary | ICD-10-CM | POA: Diagnosis not present

## 2017-09-11 DIAGNOSIS — F419 Anxiety disorder, unspecified: Secondary | ICD-10-CM | POA: Diagnosis present

## 2017-09-11 DIAGNOSIS — R0602 Shortness of breath: Secondary | ICD-10-CM

## 2017-09-11 DIAGNOSIS — I050 Rheumatic mitral stenosis: Secondary | ICD-10-CM

## 2017-09-11 DIAGNOSIS — Z8619 Personal history of other infectious and parasitic diseases: Secondary | ICD-10-CM

## 2017-09-11 DIAGNOSIS — E785 Hyperlipidemia, unspecified: Secondary | ICD-10-CM | POA: Diagnosis not present

## 2017-09-11 DIAGNOSIS — I480 Paroxysmal atrial fibrillation: Principal | ICD-10-CM | POA: Diagnosis present

## 2017-09-11 DIAGNOSIS — I342 Nonrheumatic mitral (valve) stenosis: Secondary | ICD-10-CM | POA: Diagnosis not present

## 2017-09-11 DIAGNOSIS — R791 Abnormal coagulation profile: Secondary | ICD-10-CM | POA: Diagnosis present

## 2017-09-11 DIAGNOSIS — G4733 Obstructive sleep apnea (adult) (pediatric): Secondary | ICD-10-CM | POA: Diagnosis not present

## 2017-09-11 DIAGNOSIS — Z6841 Body Mass Index (BMI) 40.0 and over, adult: Secondary | ICD-10-CM | POA: Diagnosis not present

## 2017-09-11 DIAGNOSIS — R079 Chest pain, unspecified: Secondary | ICD-10-CM | POA: Diagnosis not present

## 2017-09-11 DIAGNOSIS — R402250 Coma scale, best verbal response, oriented, unspecified time: Secondary | ICD-10-CM | POA: Diagnosis present

## 2017-09-11 DIAGNOSIS — I110 Hypertensive heart disease with heart failure: Secondary | ICD-10-CM | POA: Diagnosis present

## 2017-09-11 DIAGNOSIS — R7989 Other specified abnormal findings of blood chemistry: Secondary | ICD-10-CM | POA: Diagnosis not present

## 2017-09-11 DIAGNOSIS — I4891 Unspecified atrial fibrillation: Secondary | ICD-10-CM

## 2017-09-11 DIAGNOSIS — I248 Other forms of acute ischemic heart disease: Secondary | ICD-10-CM | POA: Diagnosis not present

## 2017-09-11 DIAGNOSIS — F17210 Nicotine dependence, cigarettes, uncomplicated: Secondary | ICD-10-CM | POA: Diagnosis present

## 2017-09-11 DIAGNOSIS — G4730 Sleep apnea, unspecified: Secondary | ICD-10-CM | POA: Diagnosis not present

## 2017-09-11 DIAGNOSIS — Z96652 Presence of left artificial knee joint: Secondary | ICD-10-CM | POA: Diagnosis present

## 2017-09-11 DIAGNOSIS — Z7982 Long term (current) use of aspirin: Secondary | ICD-10-CM

## 2017-09-11 DIAGNOSIS — J209 Acute bronchitis, unspecified: Secondary | ICD-10-CM | POA: Diagnosis not present

## 2017-09-11 DIAGNOSIS — I5030 Unspecified diastolic (congestive) heart failure: Secondary | ICD-10-CM | POA: Diagnosis not present

## 2017-09-11 DIAGNOSIS — K219 Gastro-esophageal reflux disease without esophagitis: Secondary | ICD-10-CM | POA: Diagnosis not present

## 2017-09-11 DIAGNOSIS — Z01810 Encounter for preprocedural cardiovascular examination: Secondary | ICD-10-CM | POA: Diagnosis not present

## 2017-09-11 DIAGNOSIS — E876 Hypokalemia: Secondary | ICD-10-CM | POA: Diagnosis not present

## 2017-09-11 DIAGNOSIS — Z720 Tobacco use: Secondary | ICD-10-CM | POA: Diagnosis not present

## 2017-09-11 HISTORY — DX: Nicotine dependence, cigarettes, uncomplicated: F17.210

## 2017-09-11 LAB — CBC
HCT: 39.5 % (ref 35.0–47.0)
Hemoglobin: 13.1 g/dL (ref 12.0–16.0)
MCH: 28.5 pg (ref 26.0–34.0)
MCHC: 33.1 g/dL (ref 32.0–36.0)
MCV: 86.1 fL (ref 80.0–100.0)
Platelets: 366 10*3/uL (ref 150–440)
RBC: 4.59 MIL/uL (ref 3.80–5.20)
RDW: 16.4 % — ABNORMAL HIGH (ref 11.5–14.5)
WBC: 6.4 10*3/uL (ref 3.6–11.0)

## 2017-09-11 LAB — TSH
TSH: 0.948 u[IU]/mL (ref 0.350–4.500)
TSH: 0.339 u[IU]/mL — ABNORMAL LOW (ref 0.350–4.500)

## 2017-09-11 LAB — FIBRIN DERIVATIVES D-DIMER (ARMC ONLY): Fibrin derivatives D-dimer (ARMC): 673.16 ng/mL (FEU) — ABNORMAL HIGH (ref 0.00–499.00)

## 2017-09-11 LAB — BASIC METABOLIC PANEL
Anion gap: 11 (ref 5–15)
BUN: 18 mg/dL (ref 6–20)
CO2: 26 mmol/L (ref 22–32)
Calcium: 9.4 mg/dL (ref 8.9–10.3)
Chloride: 98 mmol/L (ref 98–111)
Creatinine, Ser: 1.06 mg/dL — ABNORMAL HIGH (ref 0.44–1.00)
GFR calc Af Amer: >60 mL/min (ref >60)
GFR calc non Af Amer: 60 mL/min — ABNORMAL LOW (ref >60)
Glucose, Bld: 132 mg/dL — ABNORMAL HIGH (ref 70–99)
Potassium: 3.5 mmol/L (ref 3.5–5.1)
Sodium: 135 mmol/L (ref 135–145)

## 2017-09-11 LAB — MRSA PCR SCREENING: MRSA by PCR: NEGATIVE

## 2017-09-11 LAB — TROPONIN I
Troponin I: <0.03 ng/mL (ref <0.03)
Troponin I: 0.15 ng/mL (ref <0.03)
Troponin I: 0.6 ng/mL (ref <0.03)

## 2017-09-11 LAB — GLUCOSE, CAPILLARY: Glucose-Capillary: 103 mg/dL — ABNORMAL HIGH (ref 70–99)

## 2017-09-11 LAB — PROTIME-INR
INR: 0.86
Prothrombin Time: 11.6 seconds (ref 11.4–15.2)

## 2017-09-11 LAB — MAGNESIUM: Magnesium: 2.3 mg/dL (ref 1.7–2.4)

## 2017-09-11 LAB — PHOSPHORUS: Phosphorus: 3.9 mg/dL (ref 2.5–4.6)

## 2017-09-11 MED ORDER — ALBUTEROL SULFATE (2.5 MG/3ML) 0.083% IN NEBU: 3.0000 mL | INHALATION_SOLUTION | Freq: Four times a day (QID) | RESPIRATORY_TRACT | Status: DC | PRN

## 2017-09-11 MED ORDER — LORATADINE 10 MG PO TABS
10.0000 mg | ORAL_TABLET | Freq: Every day | ORAL | Status: DC
  Administered 2017-09-12 – 2017-09-13 (×2): 10 mg via ORAL
  Filled 2017-09-11 (×2): qty 1

## 2017-09-11 MED ORDER — DILTIAZEM HCL 100 MG IV SOLR
5.0000 mg/h | INTRAVENOUS | Status: DC
  Administered 2017-09-11: 20 mg/h via INTRAVENOUS
  Administered 2017-09-11: 15 mg/h via INTRAVENOUS
  Administered 2017-09-11 – 2017-09-12 (×2): 5 mg/h via INTRAVENOUS
  Filled 2017-09-11 (×2): qty 100

## 2017-09-11 MED ORDER — ACETAMINOPHEN 650 MG RE SUPP: 650.0000 mg | Freq: Four times a day (QID) | RECTAL | Status: DC | PRN

## 2017-09-11 MED ORDER — DIGOXIN 0.25 MG/ML IJ SOLN
0.2500 mg | Freq: Once | INTRAMUSCULAR | Status: AC
  Administered 2017-09-11: 0.25 mg via INTRAVENOUS
  Filled 2017-09-11: qty 2

## 2017-09-11 MED ORDER — ACETAMINOPHEN 325 MG PO TABS
650.0000 mg | ORAL_TABLET | Freq: Four times a day (QID) | ORAL | Status: DC | PRN
  Administered 2017-09-12 – 2017-09-13 (×3): 650 mg via ORAL
  Filled 2017-09-11 (×3): qty 2

## 2017-09-11 MED ORDER — FUROSEMIDE 10 MG/ML IJ SOLN
40.0000 mg | Freq: Once | INTRAMUSCULAR | Status: AC
  Administered 2017-09-11: 40 mg via INTRAVENOUS
  Filled 2017-09-11: qty 4

## 2017-09-11 MED ORDER — ATORVASTATIN CALCIUM 20 MG PO TABS
20.0000 mg | ORAL_TABLET | Freq: Every day | ORAL | Status: DC
  Administered 2017-09-11 – 2017-09-13 (×3): 20 mg via ORAL
  Filled 2017-09-11 (×3): qty 1

## 2017-09-11 MED ORDER — SODIUM CHLORIDE 0.9 % IV SOLN: INTRAVENOUS | Status: DC

## 2017-09-11 MED ORDER — ADULT MULTIVITAMIN W/MINERALS CH
1.0000 | ORAL_TABLET | Freq: Every day | ORAL | Status: DC
  Administered 2017-09-12 – 2017-09-13 (×2): 1 via ORAL
  Filled 2017-09-11 (×2): qty 1

## 2017-09-11 MED ORDER — PANTOPRAZOLE SODIUM 40 MG PO TBEC
40.0000 mg | DELAYED_RELEASE_TABLET | Freq: Every day | ORAL | Status: DC
  Administered 2017-09-12 – 2017-09-13 (×2): 40 mg via ORAL
  Filled 2017-09-11 (×2): qty 1

## 2017-09-11 MED ORDER — MAGNESIUM SULFATE 2 GM/50ML IV SOLN
2.0000 g | Freq: Once | INTRAVENOUS | Status: AC
  Administered 2017-09-11: 2 g via INTRAVENOUS
  Filled 2017-09-11: qty 50

## 2017-09-11 MED ORDER — ALPRAZOLAM 0.5 MG PO TABS
0.5000 mg | ORAL_TABLET | Freq: Two times a day (BID) | ORAL | Status: DC | PRN
  Administered 2017-09-11 – 2017-09-12 (×2): 0.5 mg via ORAL
  Filled 2017-09-11 (×2): qty 1

## 2017-09-11 MED ORDER — ONDANSETRON HCL 4 MG PO TABS: 4.0000 mg | ORAL_TABLET | Freq: Four times a day (QID) | ORAL | Status: DC | PRN

## 2017-09-11 MED ORDER — NICOTINE 21 MG/24HR TD PT24
21.0000 mg | MEDICATED_PATCH | Freq: Every day | TRANSDERMAL | Status: DC
  Administered 2017-09-11 – 2017-09-13 (×3): 21 mg via TRANSDERMAL
  Filled 2017-09-11 (×3): qty 1

## 2017-09-11 MED ORDER — DILTIAZEM LOAD VIA INFUSION
20.0000 mg | Freq: Once | INTRAVENOUS | Status: AC
  Administered 2017-09-11: 20 mg via INTRAVENOUS
  Filled 2017-09-11: qty 20

## 2017-09-11 MED ORDER — DILTIAZEM HCL 25 MG/5ML IV SOLN
INTRAVENOUS | Status: AC
  Filled 2017-09-11: qty 5

## 2017-09-11 MED ORDER — BUPROPION HCL ER (SR) 100 MG PO TB12
100.0000 mg | ORAL_TABLET | Freq: Every day | ORAL | Status: DC
  Administered 2017-09-12 – 2017-09-13 (×2): 100 mg via ORAL
  Filled 2017-09-11 (×3): qty 1

## 2017-09-11 MED ORDER — ENOXAPARIN SODIUM 120 MG/0.8ML ~~LOC~~ SOLN
1.0000 mg/kg | Freq: Two times a day (BID) | SUBCUTANEOUS | Status: DC
  Administered 2017-09-11 – 2017-09-12 (×2): 110 mg via SUBCUTANEOUS
  Filled 2017-09-11 (×4): qty 0.8

## 2017-09-11 MED ORDER — FLUTICASONE PROPIONATE 50 MCG/ACT NA SUSP
2.0000 | Freq: Every day | NASAL | Status: DC | PRN
  Filled 2017-09-11: qty 16

## 2017-09-11 MED ORDER — DILTIAZEM HCL 100 MG IV SOLR
INTRAVENOUS | Status: AC
  Administered 2017-09-11: 5 mg/h via INTRAVENOUS
  Filled 2017-09-11: qty 100

## 2017-09-11 MED ORDER — ONDANSETRON HCL 4 MG/2ML IJ SOLN: 4.0000 mg | Freq: Four times a day (QID) | INTRAMUSCULAR | Status: DC | PRN

## 2017-09-11 NOTE — ED Notes (Signed)
Magnesium empty, IV bag stopped. Right AC IV saline locked

## 2017-09-11 NOTE — ED Provider Notes (Addendum)
[facility] Emergency Department Provider Note  ____________________________________________   I have reviewed the triage vital signs and the nursing notes. Where available I have reviewed prior notes and, if possible and indicated, outside hospital notes.    HISTORY  Chief Complaint Chest Pain    HPI Leslie [name] is a 52 y.o. female  With a history of anxiety, reflux disease kidney stones hyperlipidemia no no history of ACS or atrial fib states that she had some palpitations last night which went away and then this morning when she is getting ready to church they came back.  She denies any fever or chills, she has very slight shortness of breath, she denies any chest pain she does have a sensation that is uncomfortable to have these palpitations.  She has not had this before.  No known history of A. fib.  She denies any early cardiac death in her family.  No leg swelling, no recent travel, no pleuritic or any other kind of chest pain.  Denies stimulants or thyroid issues To season symptoms nothing makes better nothing makes it worse no other complaints     Past Medical History:  Diagnosis Date  . Allergy   . Anemia    h/o  . Anxiety   . BRCA negative 03/22/2013  . Bronchitis 02/19/2016   ON LEVAQUIN PO  . GERD (gastroesophageal reflux disease)   . History of kidney stones   . Hyperlipidemia   . Hypertension     Patient Active Problem List   Diagnosis Date Noted  . Generalized abdominal pain 09/08/2017  . 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 Hypertension 06/06/2017  . Cough 06/06/2017  . Tobacco dependency 06/06/2017  . Urinary tract infection with hematuria 04/28/2017  . Acute upper respiratory infection 04/28/2017  . Flu-like symptoms 04/28/2017  . Dysuria 04/28/2017  . Vaginal candidiasis 04/28/2017  . 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 Fibrocystic breast disease 03/23/2013  . Family  history of breast cancer 07/20/2012    Past Surgical History:  Procedure Laterality Date  . ABDOMINAL HYSTERECTOMY     total  . ABDOMINAL HYSTERECTOMY    . BREAST BIOPSY Bilateral 2012  . BREAST BIOPSY Right 08-07-12   fibroadenomatous changes and columnar cells  . BREAST BIOPSY  02/03/2015   stereo byrnett  . CHOLECYSTECTOMY N/A 02/27/2016   Procedure: LAPAROSCOPIC CHOLECYSTECTOMY;  Surgeon: Christene Lye, MD;  Location: [facility] ORS;  Service: General;  Laterality: N/A;  . JOINT REPLACEMENT Left   . KNEE CLOSED REDUCTION Left 04/15/2015   Procedure: CLOSED MANIPULATION KNEE;  Surgeon: Thornton Park, MD;  Location: [facility] ORS;  Service: Orthopedics;  Laterality: Left;  . KNEE SURGERY    . TOTAL KNEE ARTHROPLASTY Left 12/25/2014   Procedure: TOTAL KNEE ARTHROPLASTY;  Surgeon: Thornton Park, MD;  Location: [facility] ORS;  Service: Orthopedics;  Laterality: Left;  . TUBAL LIGATION      Prior to Admission medications   Medication Sig Start Date End Date Taking? Authorizing Provider  acetaminophen (TYLENOL) 500 MG tablet Take 500 mg by mouth every 6 (six) hours as needed for mild pain, moderate pain or headache.    [provider]  albuterol (PROVENTIL HFA;VENTOLIN HFA) 108 (90 BASE) MCG/ACT inhaler Inhale 2 puffs into the lungs every 6 (six) hours as needed for wheezing or shortness of breath.    [provider]  ALPRAZolam Duanne Moron) 0.5 MG tablet Take 0.5 mg by mouth 2 (two) times daily as  needed for anxiety. Take 1 tab po by twice a day as needed  For anxiety    [provider]  atorvastatin (LIPITOR) 20 MG tablet Take 1 tablet (20 mg total) by mouth daily at 6 PM. 08/22/17   Ronnell Freshwater, NP  buPROPion (WELLBUTRIN SR) 100 MG 12 hr tablet Take 1 tablet (100 mg total) by mouth daily. 09/08/17   Ronnell Freshwater, NP  cetirizine (ZYRTEC) 10 MG tablet Take 1 tablet (10 mg total) by mouth daily. 07/14/17   Ronnell Freshwater, NP  chlorpheniramine-HYDROcodone (TUSSIONEX  PENNKINETIC ER) 10-8 MG/5ML SUER Take 5 mLs by mouth every 12 (twelve) hours as needed for cough. 09/08/17   Ronnell Freshwater, NP  Diphenhyd-Hydrocort-Nystatin (FIRST-DUKES MOUTHWASH) SUSP Swish and swallow with 51ms QID prn sore throat. 02/22/17   BRonnell Freshwater NP  estradiol (ESTRACE) 0.5 MG tablet Take 1 tablet (0.5 mg total) by mouth daily. 08/22/17   BRonnell Freshwater NP  fluconazole (DIFLUCAN) 150 MG tablet Take 1 tablet (150 mg total) by mouth daily. Take 1 tab by po once for 1 dose may repeat dose in 3 days for persistent symptoms 08/22/17   SKendell Bane NP  fluticasone ([employer] 50 MCG/ACT nasal spray Place 2 sprays into both nostrils daily as needed for allergies or rhinitis.    [provider]  hydrochlorothiazide (HYDRODIURIL) 25 MG tablet Take 1 tablet (25 mg total) by mouth daily. 08/22/17   BRonnell Freshwater NP  lansoprazole (PREVACID) 30 MG capsule Take 30 mg by mouth every morning.     [provider]  levofloxacin (LEVAQUIN) 500 MG tablet Take 1 tablet (500 mg total) by mouth daily. 09/08/17   BRonnell Freshwater NP  montelukast (SINGULAIR) 10 MG tablet Take 1 tablet (10 mg total) by mouth at bedtime. 08/22/17   BRonnell Freshwater NP  Multiple Vitamin (MULTIVITAMIN) capsule Take 1 capsule by mouth daily.    [provider]  nystatin cream (MYCOSTATIN) Apply 1 application topically 3 (three) times daily. 09/09/17   BRonnell Freshwater NP  omeprazole (PRILOSEC) 40 MG capsule Take 1 capsule (40 mg total) by mouth daily. 08/22/17   BRonnell Freshwater NP  tiZANidine (ZANAFLEX) 2 MG tablet TAKE 1 TABLET(S) BY MOUTH AT BEDTIME AS NEEDED FOR MUSCLE PAIN/SPASMS 04/15/17   BRonnell Freshwater NP    Allergies Oxycodone hcl; Dilaudid [hydromorphone hcl]; and Morphine and related  Family History  Problem Relation Age of Onset  . Cancer Mother 673      breast  . Cancer Maternal Aunt        breast  . Cancer Maternal Grandmother        breast    Social  History Social History   Tobacco Use  . Smoking status: Current Every Day Smoker    Packs/day: 0.50    Years: 20.00    Pack years: 10.00    Types: Cigarettes  . Smokeless tobacco: Never Used  Substance Use Topics  . Alcohol use: Yes    Alcohol/week: 2.0 standard drinks    Types: 2 Glasses of wine per week    Comment: wine occ  . Drug use: No    Review of Systems Constitutional: No fever/chills Eyes: No visual changes. ENT: No sore throat. No stiff neck no neck pain Cardiovascular: Denies chest pain. Respiratory: Denies shortness of breath. Gastrointestinal:   no vomiting.  No diarrhea.  No constipation. Genitourinary: Negative for dysuria. Musculoskeletal: Negative lower extremity swelling Skin: Negative  for rash. Neurological: Negative for severe headaches, focal weakness or numbness.   ____________________________________________   PHYSICAL EXAM:  VITAL SIGNS: ED Triage Vitals  Enc Vitals Group     BP 09/11/17 1115 (!) 145/119     Pulse Rate 09/11/17 1115 (!) 163     Resp --      Temp 09/11/17 1115 97.6 F (36.4 C)     Temp Source 09/11/17 1115 Oral     SpO2 09/11/17 1115 95 %     Weight 09/11/17 1120 240 lb 1.3 oz (108.9 kg)     Height 09/11/17 1120 '5\' 4"'  (1.626 m)     Head Circumference --      Peak Flow --      Pain Score 09/11/17 1123 5     Pain Loc --      Pain Edu? --      Excl. in [place]? --     Constitutional: Alert and oriented. Well appearing and in no acute distress. Eyes: Conjunctivae are normal Head: Atraumatic HEENT: No congestion/rhinnorhea. Mucous membranes are moist.  Oropharynx non-erythematous Neck:   Nontender with no meningismus, no masses, no stridor Cardiovascular: Rapid rate 160s irregularly irregular grossly normal heart sounds.  Good peripheral circulation. Respiratory: Normal respiratory effort.  No retractions. Lungs CTAB. Abdominal: Soft and nontender. No distention. No guarding no rebound Back:  There is no focal tenderness  or step off.  there is no midline tenderness there are no lesions noted. there is no CVA tenderness  Musculoskeletal: No lower extremity tenderness, no upper extremity tenderness. No joint effusions, no DVT signs strong distal pulses no edema Neurologic:  Normal speech and language. No gross focal neurologic deficits are appreciated.  Skin:  Skin is warm, dry and intact. No rash noted. Psychiatric: Mood and affect are anxious. Speech and behavior are normal.  ____________________________________________   LABS (all labs ordered are listed, but only abnormal results are displayed)  Labs Reviewed  BASIC METABOLIC PANEL  CBC  PROTIME-INR  TSH  POC URINE PREG, ED    Pertinent labs  results that were available during my care of the patient were reviewed by me and considered in my medical decision making (see chart for details). ____________________________________________  EKG  I personally interpreted any EKGs ordered by me or triage EKG shows A. fib with rapid ventricular response, rate 163, no acute ST elevation or depression unspecific ST changes likely rate related ____________________________________________  RADIOLOGY  Pertinent labs & imaging results that were available during my care of the patient were reviewed by me and considered in my medical decision making (see chart for details). If possible, patient and/or family made aware of any abnormal findings.  No results found. ____________________________________________    PROCEDURES  Procedure(s) performed: None  Procedures  Critical Care performed: CRITICAL CARE Performed by: Schuyler Amor   Total critical care time: 40 minutes  Critical care time was exclusive of separately billable procedures and treating other patients.  Critical care was necessary to treat or prevent imminent or life-threatening deterioration.  Critical care was time spent personally by me on the following activities: development of  treatment plan with patient and/or surrogate as well as nursing, discussions with consultants, evaluation of patient's response to treatment, examination of patient, obtaining history from patient or surrogate, ordering and performing treatments and interventions, ordering and review of laboratory studies, ordering and review of radiographic studies, pulse oximetry and re-evaluation of patient's condition.     ____________________________________________   INITIAL IMPRESSION /  ASSESSMENT AND PLAN / ED COURSE  Pertinent labs & imaging results that were available during my care of the patient were reviewed by me and considered in my medical decision making (see chart for details).  She is here with A. fib with RVR, we are starting her with Cardizem, she is not having any chest pain or shortness of breath.  After initial bolus of Cardizem heart rate is 119-  130, we will continue to monitor her closely and she is not having any chest pain, vital signs are otherwise reassuring I do not think she requires cardioversion.  If she develops severe chest pain hypertension or other issues she may require some further intervention from [place].  However she is thus far responded to the Cardizem. We will also check thyroid and basic blood work and reassess.  ----------------------------------------- 12:12 PM on 09/11/2017 -----------------------------------------  Bolus, heart rate briefly came down to 130s, now is between 140s and 160s she still has no chest pain she is not expressing shortness of breath she feels okay at this time, we have given her IV fluid to keep her pressure up during the Cardizem, Cardizem drip is going, I will add magnesium, I  Occasionally do see heart rates up again in the 170s which is worse she was before we started, we have been having some troubles with the Dinamap, manually her blood pressures in the 323F which I am certainly reassured by.  I do not think she meets criteria at this  time for emergent cardioversion, we will continue to medically control her rate.  We will try magnesium.   ----------------------------------------- 12:36 PM on 09/11/2017 -----------------------------------------  Patient still A. fib with RVR heart rate is in the 150s mostly at this time which is an improvement from the 160s to 180s she was, we are titrating up on the Cardizem.  Blood pressures have been holding.  I did talk to Dr. Ubaldo Glassing, very much appreciate consult, he did recommend a trial of digitalis.  We also talked about cardioversion but as he appears otherwise stable he would prefer to defer that.  It is his feeling that the dig hopefully will in conjunction with the Cardizem bring down her rate.,  Patient is not complaining of chest pain.  She is somewhat anxious we have asked family to step out for a few minutes to see if that helps her relax a little and we will go from there.  ____________________________________________   FINAL CLINICAL IMPRESSION(S) / ED DIAGNOSES  Final diagnoses:  Chest pain      This chart was dictated using voice recognition software.  Despite best efforts to proofread,  errors can occur which can change meaning.      Schuyler Amor, MD 09/11/17 1138    Schuyler Amor, MD 09/11/17 1213    Schuyler Amor, MD 09/11/17 [phone]

## 2017-09-11 NOTE — Consult Note (Signed)
Cardiology Consultation:   Patient ID: Leslie [name]; [id number]; 03/17/1965   Admit date: 09/11/2017 Date of Consult: 09/11/2017  Primary Care Provider: Ronnell Freshwater, NP Primary Cardiologist: New to [facility] Physician requesting consult: Dr. Serita Grit Reason for consult: atrial fibrillation with RVR   Patient Profile:   Leslie [name] is a 52 y.o. female with a hx of morbid obesity, sleep apnea, anxiety, hyperlipidemia, presenting with shortness of breath chest tightness in atrial fibrillation with RVR  History of Present Illness:   Leslie [name] reports having some palpitations for the past several months Noticed some symptoms yesterday but not bad This morning was sitting waiting for her husband to get ready so they could go to church, acutely developed chest tightness and tachycardia. Presented to the emergency room given symptoms of chest tightness shortness of breath  In the emergency room EKG documenting Atrial fibrillation with RVR She was given diltiazem IV 20 mg 1 with minimal improvement of her symptoms or heart rate Started on diltiazem infusion at 20 mg per hour again with minimal improvement of her symptoms Given digoxin IV bolus and heart rate did improve down to 100 up to 120 bpm  At baseline reports she is active with no significant shortness of breath or chest pain No regular exercise program Reports she is on day 3 or 4 of antibiotics for bronchitis, Levaquin Cough is not bad    Past Medical History:  Diagnosis Date  . Allergy   . Anemia    h/o  . Anxiety   . BRCA negative 03/22/2013  . Bronchitis 02/19/2016   ON LEVAQUIN PO  . GERD (gastroesophageal reflux disease)   . History of kidney stones   . Hyperlipidemia   . Hypertension     Past Surgical History:  Procedure Laterality Date  . ABDOMINAL HYSTERECTOMY     total  . ABDOMINAL HYSTERECTOMY    . BREAST BIOPSY Bilateral 2012  . BREAST BIOPSY Right 08-07-12   fibroadenomatous changes and  columnar cells  . BREAST BIOPSY  02/03/2015   stereo byrnett  . CHOLECYSTECTOMY N/A 02/27/2016   Procedure: LAPAROSCOPIC CHOLECYSTECTOMY;  Surgeon: Christene Lye, MD;  Location: [facility] ORS;  Service: General;  Laterality: N/A;  . JOINT REPLACEMENT Left   . KNEE CLOSED REDUCTION Left 04/15/2015   Procedure: CLOSED MANIPULATION KNEE;  Surgeon: Thornton Park, MD;  Location: [facility] ORS;  Service: Orthopedics;  Laterality: Left;  . KNEE SURGERY    . TOTAL KNEE ARTHROPLASTY Left 12/25/2014   Procedure: TOTAL KNEE ARTHROPLASTY;  Surgeon: Thornton Park, MD;  Location: [facility] ORS;  Service: Orthopedics;  Laterality: Left;  . TUBAL LIGATION       Home Medications:  Prior to Admission medications   Medication Sig Start Date End Date Taking? Authorizing Provider  albuterol (PROVENTIL HFA;VENTOLIN HFA) 108 (90 BASE) MCG/ACT inhaler Inhale 2 puffs into the lungs every 6 (six) hours as needed for wheezing or shortness of breath.   Yes [provider]  ALPRAZolam Duanne Moron) 0.5 MG tablet Take 0.5 mg by mouth 2 (two) times daily as needed for anxiety. Take 1 tab po by twice a day as needed  For anxiety   Yes [provider]  aspirin EC 81 MG tablet Take 81 mg by mouth daily.   Yes [provider]  atorvastatin (LIPITOR) 20 MG tablet Take 1 tablet (20 mg total) by mouth daily at 6 PM. 08/22/17  Yes Boscia, Heather E, NP  cetirizine (ZYRTEC) 10 MG tablet Take 1  tablet (10 mg total) by mouth daily. 07/14/17  Yes Boscia, Greer Ee, NP  chlorpheniramine-HYDROcodone (TUSSIONEX PENNKINETIC ER) 10-8 MG/5ML SUER Take 5 mLs by mouth every 12 (twelve) hours as needed for cough. 09/08/17  Yes Boscia, Greer Ee, NP  estradiol (ESTRACE) 0.5 MG tablet Take 1 tablet (0.5 mg total) by mouth daily. 08/22/17  Yes Boscia, Greer Ee, NP  fluticasone (FLONASE) 50 MCG/ACT nasal spray Place 2 sprays into both nostrils daily as needed for allergies or rhinitis.   Yes [provider]  hydrochlorothiazide  (HYDRODIURIL) 25 MG tablet Take 1 tablet (25 mg total) by mouth daily. 08/22/17  Yes Boscia, Greer Ee, NP  levofloxacin (LEVAQUIN) 500 MG tablet Take 1 tablet (500 mg total) by mouth daily. 09/08/17  Yes Boscia, Heather E, NP  montelukast (SINGULAIR) 10 MG tablet Take 1 tablet (10 mg total) by mouth at bedtime. 08/22/17  Yes Ronnell Freshwater, NP  Multiple Vitamin (MULTIVITAMIN) capsule Take 1 capsule by mouth daily.   Yes [provider]  naproxen sodium (ALEVE) 220 MG tablet Take 220 mg by mouth daily as needed (pain).   Yes [provider]  nystatin cream (MYCOSTATIN) Apply 1 application topically 3 (three) times daily. 09/09/17  Yes Boscia, Greer Ee, NP  omeprazole (PRILOSEC) 40 MG capsule Take 1 capsule (40 mg total) by mouth daily. 08/22/17  Yes Ronnell Freshwater, NP  buPROPion (WELLBUTRIN SR) 100 MG 12 hr tablet Take 1 tablet (100 mg total) by mouth daily. Patient not taking: Reported on 09/11/2017 09/08/17   Ronnell Freshwater, NP  Diphenhyd-Hydrocort-Nystatin (FIRST-DUKES MOUTHWASH) SUSP Swish and swallow with 20ms QID prn sore throat. Patient not taking: Reported on 09/11/2017 02/22/17   BRonnell Freshwater NP  fluconazole (DIFLUCAN) 150 MG tablet Take 1 tablet (150 mg total) by mouth daily. Take 1 tab by po once for 1 dose may repeat dose in 3 days for persistent symptoms Patient not taking: Reported on 09/11/2017 08/22/17   SKendell Bane NP  tiZANidine (ZANAFLEX) 2 MG tablet TAKE 1 TABLET(S) BY MOUTH AT BEDTIME AS NEEDED FOR MUSCLE PAIN/SPASMS Patient not taking: Reported on 09/11/2017 04/15/17   BRonnell Freshwater NP    Inpatient Medications: Scheduled Meds: . atorvastatin  20 mg Oral q1800  . buPROPion  100 mg Oral Daily  . enoxaparin (LOVENOX) injection  1 mg/kg Subcutaneous Q12H  . loratadine  10 mg Oral Daily  . multivitamin with minerals  1 tablet Oral Daily  . nicotine  21 mg Transdermal Daily  . [START ON 09/12/2017] pantoprazole  40 mg Oral Daily   Continuous  Infusions: . sodium chloride    . diltiazem (CARDIZEM) infusion 20 mg/hr (09/11/17 1515)   PRN Meds: acetaminophen **OR** acetaminophen, albuterol, ALPRAZolam, fluticasone, ondansetron **OR** ondansetron (ZOFRAN) IV  Allergies:    Allergies  Allergen Reactions  . Oxycodone Hcl Itching  . Dilaudid [Hydromorphone Hcl] Itching  . Morphine And Related Hives    Social History:   Social History   Socioeconomic History  . Marital status: Married    Spouse name: Not on file  . Number of children: Not on file  . Years of education: Not on file  . Highest education level: Not on file  Occupational History  . Not on file  Social Needs  . Financial resource strain: Not on file  . Food insecurity:    Worry: Not on file    Inability: Not on file  . Transportation needs:    Medical: Not on file  Non-medical: Not on file  Tobacco Use  . Smoking status: Current Every Day Smoker    Packs/day: 0.50    Years: 20.00    Pack years: 10.00    Types: Cigarettes  . Smokeless tobacco: Never Used  Substance and Sexual Activity  . Alcohol use: Yes    Alcohol/week: 2.0 standard drinks    Types: 2 Glasses of wine per week    Comment: wine occ  . Drug use: No  . Sexual activity: Not on file  Lifestyle  . Physical activity:    Days per week: Not on file    Minutes per session: Not on file  . Stress: Not on file  Relationships  . Social connections:    Talks on phone: Not on file    Gets together: Not on file    Attends religious service: Not on file    Active member of club or organization: Not on file    Attends meetings of clubs or organizations: Not on file    Relationship status: Not on file  . Intimate partner violence:    Fear of current or ex partner: Not on file    Emotionally abused: Not on file    Physically abused: Not on file    Forced sexual activity: Not on file  Other Topics Concern  . Not on file  Social History Narrative  . Not on file    Family History:     Family History  Problem Relation Age of Onset  . Cancer Mother 6       breast  . Cancer Maternal Aunt        breast  . Cancer Maternal Grandmother        breast     ROS:  Please see the history of present illness.  Review of Systems  Constitutional: Negative.   Respiratory: Positive for shortness of breath.   Cardiovascular: Negative.        Chest tightness  Gastrointestinal: Negative.   Musculoskeletal: Negative.   Neurological: Negative.   Psychiatric/Behavioral: Negative.   All other systems reviewed and are negative.   Physical Exam/Data:   Vitals:   09/11/17 1345 09/11/17 1346 09/11/17 1347 09/11/17 1505  BP: (!) 138/110   (!) 123/56  Pulse: (!) 118 (!) 144 (!) 104   Resp: '16 19  20  ' Temp:    98.8 F (37.1 C)  TempSrc:    Oral  SpO2:    94%  Weight:    107.6 kg  Height:    '5\' 4"'  (1.626 m)   No intake or output data in the 24 hours ending 09/11/17 1710 Filed Weights   09/11/17 1120 09/11/17 1505  Weight: 108.9 kg 107.6 kg   Body mass index is 40.72 kg/m.  General:  Well nourished, well developed, in no acute distress,morbidly obese HEENT: normal Lymph: no adenopathy Neck: no JVD Endocrine:  No thryomegaly Vascular: No carotid bruits; FA pulses 2+ bilaterally without bruits  Cardiac:  Irregularly irregular, rapid,  no murmur  Lungs:  clear to auscultation bilaterally, no wheezing, rhonchi or rales  Abd: soft, nontender, no hepatomegaly  Ext: no edema Musculoskeletal:  No deformities, BUE and BLE strength normal and equal Skin: warm and dry  Neuro:  CNs 2-12 intact, no focal abnormalities noted Psych:  Normal affect   EKG:  The EKG was personally reviewed and demonstrates:   Shows atrial fibrillation with RVR rate 150 bpm nonspecific ST abnormality  Telemetry:  Telemetry was personally reviewed and  demonstrates:   Atrial fibrillation rate 110  Relevant CV Studies: Echocardiogram pending  Laboratory Data:  Chemistry Recent Labs  Lab  09/11/17 1132  NA 135  K 3.5  CL 98  CO2 26  GLUCOSE 132*  BUN 18  CREATININE 1.06*  CALCIUM 9.4  GFRNONAA 60*  GFRAA >60  ANIONGAP 11    No results for input(s): PROT, ALBUMIN, AST, ALT, ALKPHOS, BILITOT in the last 168 hours. Hematology Recent Labs  Lab 09/11/17 1132  WBC 6.4  RBC 4.59  HGB 13.1  HCT 39.5  MCV 86.1  MCH 28.5  MCHC 33.1  RDW 16.4*  PLT 366   Cardiac Enzymes Recent Labs  Lab 09/11/17 1132 09/11/17 1521  TROPONINI <0.03 0.15*   No results for input(s): TROPIPOC in the last 168 hours.  BNPNo results for input(s): BNP, PROBNP in the last 168 hours.  DDimer No results for input(s): DDIMER in the last 168 hours.  Radiology/Studies:  Dg Chest 2 View  Result Date: 09/08/2017 CLINICAL DATA:  Dry cough with chest pain and shortness-of-breath 3 weeks. EXAM: CHEST - 2 VIEW COMPARISON:  12/20/2014 FINDINGS: Lungs are adequately inflated without focal airspace consolidation or effusion. There is diffuse bilateral stable interstitial prominence. Cardiomediastinal silhouette and remainder of the exam is unchanged. IMPRESSION: Stable diffuse prominence of the interstitial markings. No acute airspace process or effusion. Electronically Signed   By: Marin Olp M.D.   On: 09/08/2017 15:21   Dg Chest Port 1 View  Result Date: 09/11/2017 CLINICAL DATA:  Chest pain EXAM: PORTABLE CHEST 1 VIEW COMPARISON:  09/08/2017 chest radiograph. FINDINGS: Pacer pad overlies the left chest. Stable cardiomediastinal silhouette with normal heart size. No pneumothorax. No pleural effusion. Diffuse prominence of the parahilar interstitial markings. IMPRESSION: Diffuse prominence of the parahilar interstitial markings, favor pulmonary edema. Electronically Signed   By: Ilona Sorrel M.D.   On: 09/11/2017 11:50    Assessment and Plan:   1. Atrial fibrillation with RVR Patient reports palpitations for the past several months Timing of atrial fibrillation unclear Rapid rate this  morning, unclear precipitating cause Day 3 of treatment for bronchitis that she reports symptoms are not particularly bad Likely underlying sleep apnea not treated I risk of recurrent arrhythmia given her body habitus/morbid obesity and sleep apnea Currently on Lovenox Echocardiogram pending Would change diltiazem infusion to oral diltiazem tomorrow  2)  elevated troponin In the setting of atrial fibrillation with RVR,  Likely demand ischemia Would be difficult to stress given her body habitus Echocardiogram pending -unable to exclude PE as d-dimer elevated Consider chest CTA  3) morbid obesity As well as likely underlying sleep apnea Would recommend outpatient sleep study  4) elevated d-dimer 673 above normal range of 500 Consider chest CTA to rule out PE   Long discussion with patient and family at the bedside concerning recent events, atrial fibrillation, management  Total encounter time more than 110 minutes  Greater than 50% was spent in counseling and coordination of care with the patient  For questions or updates, please contact [facility] Please consult [email or web address] for contact info under Cardiology/STEMI.   Signed, Ida Rogue, MD  09/11/2017 5:10 PM

## 2017-09-11 NOTE — ED Notes (Signed)
Pt resting on monitor with family at bedside. Pt placed on bedpan.

## 2017-09-11 NOTE — ED Triage Notes (Signed)
Pt via pov from home with chest tightness and palpitations since last night. She endorses sob. Pt tearful during triage.

## 2017-09-11 NOTE — H&P (Addendum)
Uncertain at [place] NAME: Leslie [name]    MR#:  [id number]  DATE OF BIRTH:  December 16, 1965  DATE OF ADMISSION:  09/11/2017  PRIMARY CARE PHYSICIAN: Ronnell Freshwater, NP   REQUESTING/REFERRING PHYSICIAN:  Schuyler Amor, MD     CHIEF COMPLAINT:   Chief Complaint  Patient presents with  . Chest Pain    HISTORY OF PRESENT ILLNESS: Leslie [name]  is a 52 y.o. female with a known history of seasonal allergies, anxiety, GERD, history of kidney stones, essential hypertension and hyperlipidemia who was on her way to church when she started having chest pain.  She also felt her heart was beating very fast.  Patient states that she had felt like this last night but lasted briefly.  Patient when she arrived in the ED heart rate was in the 170s 180s with A. fib with RVR.  Patient had to be started on Cardizem drip heart rate continues to be elevated therefore received a dose of digoxin. PAST MEDICAL HISTORY:   Past Medical History:  Diagnosis Date  . Allergy   . Anemia    h/o  . Anxiety   . BRCA negative 03/22/2013  . Bronchitis 02/19/2016   ON LEVAQUIN PO  . GERD (gastroesophageal reflux disease)   . History of kidney stones   . Hyperlipidemia   . Hypertension     PAST SURGICAL HISTORY:  Past Surgical History:  Procedure Laterality Date  . ABDOMINAL HYSTERECTOMY     total  . ABDOMINAL HYSTERECTOMY    . BREAST BIOPSY Bilateral 2012  . BREAST BIOPSY Right 08-07-12   fibroadenomatous changes and columnar cells  . BREAST BIOPSY  02/03/2015   stereo byrnett  . CHOLECYSTECTOMY N/A 02/27/2016   Procedure: LAPAROSCOPIC CHOLECYSTECTOMY;  Surgeon: Christene Lye, MD;  Location: [facility] ORS;  Service: General;  Laterality: N/A;  . JOINT REPLACEMENT Left   . KNEE CLOSED REDUCTION Left 04/15/2015   Procedure: CLOSED MANIPULATION KNEE;  Surgeon: Thornton Park, MD;  Location: [facility] ORS;  Service: Orthopedics;  Laterality: Left;  . KNEE SURGERY    .  TOTAL KNEE ARTHROPLASTY Left 12/25/2014   Procedure: TOTAL KNEE ARTHROPLASTY;  Surgeon: Thornton Park, MD;  Location: [facility] ORS;  Service: Orthopedics;  Laterality: Left;  . TUBAL LIGATION      SOCIAL HISTORY:  Social History   Tobacco Use  . Smoking status: Current Every Day Smoker    Packs/day: 0.50    Years: 20.00    Pack years: 10.00    Types: Cigarettes  . Smokeless tobacco: Never Used  Substance Use Topics  . Alcohol use: Yes    Alcohol/week: 2.0 standard drinks    Types: 2 Glasses of wine per week    Comment: wine occ    FAMILY HISTORY:  Family History  Problem Relation Age of Onset  . Cancer Mother 32       breast  . Cancer Maternal Aunt        breast  . Cancer Maternal Grandmother        breast    DRUG ALLERGIES:  Allergies  Allergen Reactions  . Oxycodone Hcl Itching  . Dilaudid [Hydromorphone Hcl] Itching  . Morphine And Related Hives    REVIEW OF SYSTEMS:   CONSTITUTIONAL: No fever, fatigue or weakness.  EYES: No blurred or double vision.  EARS, NOSE, AND THROAT: No tinnitus or ear pain.  RESPIRATORY: No cough, shortness of breath, wheezing or hemoptysis.  CARDIOVASCULAR: No  chest pain, orthopnea, edema.  Positive shortness of breath GASTROINTESTINAL: No nausea, vomiting, diarrhea or abdominal pain.  GENITOURINARY: No dysuria, hematuria.  ENDOCRINE: No polyuria, nocturia,  HEMATOLOGY: No anemia, easy bruising or bleeding SKIN: No rash or lesion. MUSCULOSKELETAL: No joint pain or arthritis.   NEUROLOGIC: No tingling, numbness, weakness.  PSYCHIATRY: No anxiety or depression.   MEDICATIONS AT HOME:  Prior to Admission medications   Medication Sig Start Date End Date Taking? Authorizing Provider  albuterol (PROVENTIL HFA;VENTOLIN HFA) 108 (90 BASE) MCG/ACT inhaler Inhale 2 puffs into the lungs every 6 (six) hours as needed for wheezing or shortness of breath.   Yes [provider]  ALPRAZolam Duanne Moron) 0.5 MG tablet Take 0.5 mg by mouth 2  (two) times daily as needed for anxiety. Take 1 tab po by twice a day as needed  For anxiety   Yes [provider]  aspirin EC 81 MG tablet Take 81 mg by mouth daily.   Yes [provider]  atorvastatin (LIPITOR) 20 MG tablet Take 1 tablet (20 mg total) by mouth daily at 6 PM. 08/22/17  Yes Boscia, Heather E, NP  cetirizine (ZYRTEC) 10 MG tablet Take 1 tablet (10 mg total) by mouth daily. 07/14/17  Yes Boscia, Greer Ee, NP  chlorpheniramine-HYDROcodone (TUSSIONEX PENNKINETIC ER) 10-8 MG/5ML SUER Take 5 mLs by mouth every 12 (twelve) hours as needed for cough. 09/08/17  Yes Boscia, Greer Ee, NP  estradiol (ESTRACE) 0.5 MG tablet Take 1 tablet (0.5 mg total) by mouth daily. 08/22/17  Yes Boscia, Greer Ee, NP  fluticasone (FLONASE) 50 MCG/ACT nasal spray Place 2 sprays into both nostrils daily as needed for allergies or rhinitis.   Yes [provider]  hydrochlorothiazide (HYDRODIURIL) 25 MG tablet Take 1 tablet (25 mg total) by mouth daily. 08/22/17  Yes Boscia, Greer Ee, NP  levofloxacin (LEVAQUIN) 500 MG tablet Take 1 tablet (500 mg total) by mouth daily. 09/08/17  Yes Boscia, Heather E, NP  montelukast (SINGULAIR) 10 MG tablet Take 1 tablet (10 mg total) by mouth at bedtime. 08/22/17  Yes Ronnell Freshwater, NP  Multiple Vitamin (MULTIVITAMIN) capsule Take 1 capsule by mouth daily.   Yes [provider]  naproxen sodium (ALEVE) 220 MG tablet Take 220 mg by mouth daily as needed (pain).   Yes [provider]  nystatin cream (MYCOSTATIN) Apply 1 application topically 3 (three) times daily. 09/09/17  Yes Boscia, Greer Ee, NP  omeprazole (PRILOSEC) 40 MG capsule Take 1 capsule (40 mg total) by mouth daily. 08/22/17  Yes Ronnell Freshwater, NP  buPROPion (WELLBUTRIN SR) 100 MG 12 hr tablet Take 1 tablet (100 mg total) by mouth daily. Patient not taking: Reported on 09/11/2017 09/08/17   Ronnell Freshwater, NP  Diphenhyd-Hydrocort-Nystatin (FIRST-DUKES MOUTHWASH) SUSP Swish  and swallow with 84ms QID prn sore throat. Patient not taking: Reported on 09/11/2017 02/22/17   BRonnell Freshwater NP  fluconazole (DIFLUCAN) 150 MG tablet Take 1 tablet (150 mg total) by mouth daily. Take 1 tab by po once for 1 dose may repeat dose in 3 days for persistent symptoms Patient not taking: Reported on 09/11/2017 08/22/17   SKendell Bane NP  tiZANidine (ZANAFLEX) 2 MG tablet TAKE 1 TABLET(S) BY MOUTH AT BEDTIME AS NEEDED FOR MUSCLE PAIN/SPASMS Patient not taking: Reported on 09/11/2017 04/15/17   BRonnell Freshwater NP      PHYSICAL EXAMINATION:   VITAL SIGNS: Blood pressure 128/72, pulse (!) 163, temperature 97.6 F (36.4 C), temperature  source Oral, resp. rate (!) 25, height '5\' 4"'  (1.626 m), weight 108.9 kg, SpO2 92 %.  GENERAL:  52 y.o.-year-old patient lying in the bed with heavy breathing EYES: Pupils equal, round, reactive to light and accommodation. No scleral icterus. Extraocular muscles intact.  HEENT: Head atraumatic, normocephalic. Oropharynx and nasopharynx clear.  NECK:  Supple, no jugular venous distention. No thyroid enlargement, no tenderness.  LUNGS: Normal breath sounds bilaterally, no wheezing, rales,rhonchi or crepitation.  Some use of accessory muscles of respiration.  CARDIOVASCULAR: Irregularly irregular no murmurs, rubs, or gallops.  ABDOMEN: Soft, nontender, nondistended. Bowel sounds present. No organomegaly or mass.  EXTREMITIES: No pedal edema, cyanosis, or clubbing.  NEUROLOGIC: Cranial nerves II through XII are intact. Muscle strength 5/5 in all extremities. Sensation intact. Gait not checked.  PSYCHIATRIC: The patient is alert and oriented x 3.  SKIN: No obvious rash, lesion, or ulcer.   LABORATORY PANEL:   CBC Recent Labs  Lab 09/11/17 1132  WBC 6.4  HGB 13.1  HCT 39.5  PLT 366  MCV 86.1  MCH 28.5  MCHC 33.1  RDW 16.4*    ------------------------------------------------------------------------------------------------------------------  Chemistries  Recent Labs  Lab 09/11/17 1132  NA 135  K 3.5  CL 98  CO2 26  GLUCOSE 132*  BUN 18  CREATININE 1.06*  CALCIUM 9.4   ------------------------------------------------------------------------------------------------------------------ estimated creatinine clearance is 75.7 mL/min (A) (by C-G formula based on SCr of 1.06 mg/dL (H)). ------------------------------------------------------------------------------------------------------------------ Recent Labs    09/11/17 1132  TSH 0.948     Coagulation profile Recent Labs  Lab 09/11/17 1132  INR 0.86   ------------------------------------------------------------------------------------------------------------------- No results for input(s): DDIMER in the last 72 hours. -------------------------------------------------------------------------------------------------------------------  Cardiac Enzymes No results for input(s): CKMB, TROPONINI, MYOGLOBIN in the last 168 hours.  Invalid input(s): CK ------------------------------------------------------------------------------------------------------------------ Invalid input(s): POCBNP  ---------------------------------------------------------------------------------------------------------------  Urinalysis    Component Value Date/Time   COLORURINE YELLOW (A) 12/11/2014 1027   APPEARANCEUR CLEAR (A) 12/11/2014 1027   APPEARANCEUR Cloudy 04/05/2012 0827   LABSPEC 1.006 12/11/2014 1027   LABSPEC 1.015 04/05/2012 0827   PHURINE 6.0 12/11/2014 1027   GLUCOSEU NEGATIVE 12/11/2014 1027   GLUCOSEU Negative 04/05/2012 0827   HGBUR NEGATIVE 12/11/2014 1027   BILIRUBINUR negative 09/08/2017 1421   BILIRUBINUR Negative 04/05/2012 0827   KETONESUR NEGATIVE 12/11/2014 1027   PROTEINUR Positive (A) 09/08/2017 1421   PROTEINUR NEGATIVE 12/11/2014 1027    UROBILINOGEN 0.2 09/08/2017 1421   NITRITE negative 09/08/2017 1421   NITRITE NEGATIVE 12/11/2014 1027   LEUKOCYTESUR Small (1+) (A) 09/08/2017 1421   LEUKOCYTESUR Negative 04/05/2012 0827     RADIOLOGY: Dg Chest Port 1 View  Result Date: 09/11/2017 CLINICAL DATA:  Chest pain EXAM: PORTABLE CHEST 1 VIEW COMPARISON:  09/08/2017 chest radiograph. FINDINGS: Pacer pad overlies the left chest. Stable cardiomediastinal silhouette with normal heart size. No pneumothorax. No pleural effusion. Diffuse prominence of the parahilar interstitial markings. IMPRESSION: Diffuse prominence of the parahilar interstitial markings, favor pulmonary edema. Electronically Signed   By: Ilona Sorrel M.D.   On: 09/11/2017 11:50    EKG: Orders placed or performed during the hospital encounter of 09/11/17  . EKG 12-Lead  . EKG 12-Lead  . ED EKG within 10 minutes  . ED EKG within 10 minutes    IMPRESSION AND PLAN: Patient is a 52 year old presenting to the emergency room with chest pain noted to have A. fib with RVR  1.  A. fib with RVR heart rate continues to be elevated with Cardizem drip.  Have her  receive a dose of digoxin.  Patient's blood pressure did drop briefly.  At this point I will continue the Cardizem.  I will discuss with cardiology to see if patient needs amiodarone drip instead if blood pressure cannot tolerate the Cardizem drip. I will start patient on full dose Lovenox. Check TSH Obtain echocardiogram of the heart Check a d-dimer if elevated may need work-up for pulmonary embolism cardiology consult  2.  History of hypertension blood pressure was lower earlier we will hold all her blood pressure medication  3.  Hyperlipidemia continue Lipitor  4.   anxiety continue alprazolam  5.  Nicotine abuse smoking cessation provided 4 minutes spent nicotine patch offered strongly recommended patient stop smoking     All the records are reviewed and case discussed with ED provider. Management  plans discussed with the patient, family and they are in agreement.  CODE STATUS: Code Status History    Date Active Date Inactive Code Status Order ID Comments User Context   12/25/2014 1735 12/28/2014 1612 Full Code [id number]  Thornton Park, MD Inpatient       TOTAL TIME TAKING CARE OF THIS PATIENT: 55 minutes critical care time spent patient high risk of cardiopulmonary arrest  Dustin Flock M.D on 09/11/2017 at 1:04 PM  Between 7am to 6pm - Pager - [phone]  After 6pm go to [email or web address] - password [email or web address]  [employer] [facility] Office  [phone]  CC: Primary care physician; Ronnell Freshwater, NP

## 2017-09-12 ENCOUNTER — Inpatient Hospital Stay (HOSPITAL_COMMUNITY)
Admit: 2017-09-12 | Discharge: 2017-09-12 | Disposition: A | Discharge status: Home / Self Care | Payer: BLUE CROSS/BLUE SHIELD | Attending: Cardiovascular Disease | Admitting: Cardiovascular Disease

## 2017-09-12 DIAGNOSIS — I5030 Unspecified diastolic (congestive) heart failure: Secondary | ICD-10-CM

## 2017-09-12 DIAGNOSIS — I4891 Unspecified atrial fibrillation: Secondary | ICD-10-CM

## 2017-09-12 LAB — BASIC METABOLIC PANEL
Anion gap: 6 (ref 5–15)
BUN: 17 mg/dL (ref 6–20)
CO2: 29 mmol/L (ref 22–32)
Calcium: 8.4 mg/dL — ABNORMAL LOW (ref 8.9–10.3)
Chloride: 101 mmol/L (ref 98–111)
Creatinine, Ser: 1.05 mg/dL — ABNORMAL HIGH (ref 0.44–1.00)
GFR calc Af Amer: >60 mL/min (ref >60)
GFR calc non Af Amer: >60 mL/min (ref >60)
Glucose, Bld: 127 mg/dL — ABNORMAL HIGH (ref 70–99)
Potassium: 2.9 mmol/L — ABNORMAL LOW (ref 3.5–5.1)
Sodium: 136 mmol/L (ref 135–145)

## 2017-09-12 LAB — CBC
HCT: 35.7 % (ref 35.0–47.0)
Hemoglobin: 11.9 g/dL — ABNORMAL LOW (ref 12.0–16.0)
MCH: 28.5 pg (ref 26.0–34.0)
MCHC: 33.4 g/dL (ref 32.0–36.0)
MCV: 85.4 fL (ref 80.0–100.0)
Platelets: 306 10*3/uL (ref 150–440)
RBC: 4.17 MIL/uL (ref 3.80–5.20)
RDW: 16.2 % — ABNORMAL HIGH (ref 11.5–14.5)
WBC: 6.8 10*3/uL (ref 3.6–11.0)

## 2017-09-12 LAB — POTASSIUM: Potassium: 4 mmol/L (ref 3.5–5.1)

## 2017-09-12 LAB — ECHOCARDIOGRAM COMPLETE
Height: 64 in
Weight: 3795.44 oz

## 2017-09-12 LAB — HEMOGLOBIN A1C
Hgb A1c MFr Bld: 5.8 % — ABNORMAL HIGH (ref 4.8–5.6)
Mean Plasma Glucose: 119.76 mg/dL

## 2017-09-12 LAB — TROPONIN I: Troponin I: 0.48 ng/mL (ref <0.03)

## 2017-09-12 MED ORDER — DILTIAZEM HCL 30 MG PO TABS
30.0000 mg | ORAL_TABLET | Freq: Four times a day (QID) | ORAL | Status: DC
  Administered 2017-09-12 – 2017-09-13 (×5): 30 mg via ORAL
  Filled 2017-09-12 (×5): qty 1

## 2017-09-12 MED ORDER — PERFLUTREN LIPID MICROSPHERE
1.0000 mL | INTRAVENOUS | Status: AC | PRN
  Administered 2017-09-12: 2 mL via INTRAVENOUS
  Filled 2017-09-12: qty 10

## 2017-09-12 MED ORDER — KETOROLAC TROMETHAMINE 15 MG/ML IJ SOLN
15.0000 mg | Freq: Once | INTRAMUSCULAR | Status: AC
  Administered 2017-09-12: 15 mg via INTRAVENOUS
  Filled 2017-09-12: qty 1

## 2017-09-12 MED ORDER — FUROSEMIDE 10 MG/ML IJ SOLN
40.0000 mg | Freq: Once | INTRAMUSCULAR | Status: AC
  Administered 2017-09-12: 40 mg via INTRAVENOUS
  Filled 2017-09-12: qty 4

## 2017-09-12 MED ORDER — POTASSIUM CHLORIDE 20 MEQ PO PACK: 60.0000 meq | PACK | Freq: Once | ORAL | Status: DC

## 2017-09-12 MED ORDER — RIVAROXABAN 20 MG PO TABS
20.0000 mg | ORAL_TABLET | Freq: Every day | ORAL | Status: DC
  Administered 2017-09-12: 20 mg via ORAL
  Filled 2017-09-12: qty 1

## 2017-09-12 MED ORDER — DILTIAZEM HCL 100 MG IV SOLR: 5.0000 mg/h | INTRAVENOUS | Status: DC

## 2017-09-12 MED ORDER — FUROSEMIDE 20 MG PO TABS
20.0000 mg | ORAL_TABLET | Freq: Every day | ORAL | Status: DC
  Administered 2017-09-12 – 2017-09-13 (×2): 20 mg via ORAL
  Filled 2017-09-12 (×2): qty 1

## 2017-09-12 MED ORDER — LEVOFLOXACIN 500 MG PO TABS
500.0000 mg | ORAL_TABLET | Freq: Every day | ORAL | Status: DC
  Administered 2017-09-12 – 2017-09-13 (×2): 500 mg via ORAL
  Filled 2017-09-12 (×2): qty 1

## 2017-09-12 MED ORDER — POTASSIUM CHLORIDE 20 MEQ PO PACK
60.0000 meq | PACK | Freq: Once | ORAL | Status: AC
  Administered 2017-09-12: 60 meq via ORAL
  Filled 2017-09-12: qty 3

## 2017-09-12 NOTE — Consult Note (Signed)
PULMONARY / CRITICAL CARE MEDICINE   Name: Leslie [name] MRN: [id number] DOB: [birth year]    ADMISSION DATE:  09/11/2017   CONSULTATION DATE:  09/11/2017  REFERRING MD: Dr. Posey Pronto  Reason: A. fib with RVR, acute pulmonary edema  HISTORY OF PRESENT ILLNESS:   This is a 52 year old female with a medical history as indicated below, current smoker, who presented to the ED with complaints of shortness of breath and chest tightness.  In the ED her EKG showed A. fib with RVR and her chest x-ray showed pulmonary edema.  She was started on a diltiazem drip and admitted to the ICU.  He is now in sinus rhythm at a rate of 80 bpm.  She denies chest pain palpitations nausea vomiting diarrhea dizziness and headache.  PAST MEDICAL HISTORY :  She  has a past medical history of Allergy, Anemia, Anxiety, BRCA negative (03/22/2013), Bronchitis (02/19/2016), Cigarette smoker (09/11/2017), GERD (gastroesophageal reflux disease), History of kidney stones, Hyperlipidemia, and Hypertension.  PAST SURGICAL HISTORY: She  has a past surgical history that includes Abdominal hysterectomy; Knee surgery; Abdominal hysterectomy; Tubal ligation; Total knee arthroplasty (Left, 12/25/2014); Knee Closed Reduction (Left, 04/15/2015); Breast biopsy (Bilateral, 2012); Breast biopsy (Right, 08-07-12); Breast biopsy (02/03/2015); Joint replacement (Left); and Cholecystectomy (N/A, 02/27/2016).  Allergies  Allergen Reactions  . Oxycodone Hcl Itching  . Dilaudid [Hydromorphone Hcl] Itching  . Morphine And Related Hives    No current facility-administered medications on file prior to encounter.    Current Outpatient Medications on File Prior to Encounter  Medication Sig  . albuterol (PROVENTIL HFA;VENTOLIN HFA) 108 (90 BASE) MCG/ACT inhaler Inhale 2 puffs into the lungs every 6 (six) hours as needed for wheezing or shortness of breath.  . ALPRAZolam (XANAX) 0.5 MG tablet Take 0.5 mg by mouth 2 (two) times daily as needed for anxiety.  Take 1 tab po by twice a day as needed  For anxiety  . aspirin EC 81 MG tablet Take 81 mg by mouth daily.  Marland Kitchen atorvastatin (LIPITOR) 20 MG tablet Take 1 tablet (20 mg total) by mouth daily at 6 PM.  . cetirizine (ZYRTEC) 10 MG tablet Take 1 tablet (10 mg total) by mouth daily.  . chlorpheniramine-HYDROcodone (TUSSIONEX PENNKINETIC ER) 10-8 MG/5ML SUER Take 5 mLs by mouth every 12 (twelve) hours as needed for cough.  . estradiol (ESTRACE) 0.5 MG tablet Take 1 tablet (0.5 mg total) by mouth daily.  . fluticasone (FLONASE) 50 MCG/ACT nasal spray Place 2 sprays into both nostrils daily as needed for allergies or rhinitis.  . hydrochlorothiazide (HYDRODIURIL) 25 MG tablet Take 1 tablet (25 mg total) by mouth daily.  Marland Kitchen levofloxacin (LEVAQUIN) 500 MG tablet Take 1 tablet (500 mg total) by mouth daily.  . montelukast (SINGULAIR) 10 MG tablet Take 1 tablet (10 mg total) by mouth at bedtime.  . Multiple Vitamin (MULTIVITAMIN) capsule Take 1 capsule by mouth daily.  . naproxen sodium (ALEVE) 220 MG tablet Take 220 mg by mouth daily as needed (pain).  . nystatin cream (MYCOSTATIN) Apply 1 application topically 3 (three) times daily.  Marland Kitchen omeprazole (PRILOSEC) 40 MG capsule Take 1 capsule (40 mg total) by mouth daily.  Marland Kitchen buPROPion (WELLBUTRIN SR) 100 MG 12 hr tablet Take 1 tablet (100 mg total) by mouth daily. (Patient not taking: Reported on 09/11/2017)  . Diphenhyd-Hydrocort-Nystatin (FIRST-DUKES MOUTHWASH) SUSP Swish and swallow with 60ms QID prn sore throat. (Patient not taking: Reported on 09/11/2017)  . fluconazole (DIFLUCAN) 150 MG tablet Take 1 tablet (150  mg total) by mouth daily. Take 1 tab by po once for 1 dose may repeat dose in 3 days for persistent symptoms (Patient not taking: Reported on 09/11/2017)  . tiZANidine (ZANAFLEX) 2 MG tablet TAKE 1 TABLET(S) BY MOUTH AT BEDTIME AS NEEDED FOR MUSCLE PAIN/SPASMS (Patient not taking: Reported on 09/11/2017)    FAMILY HISTORY:  Her family history includes  Cancer in her maternal aunt and maternal grandmother; Cancer (age of onset: 52) in her mother.  SOCIAL HISTORY: She  reports that she has been smoking cigarettes. She has a 10.00 pack-year smoking history. She has never used smokeless tobacco. She reports that she drinks about 2.0 standard drinks of alcohol per week. She reports that she does not use drugs.  REVIEW OF SYSTEMS:   Constitutional: Negative for fever and chills.  HENT: Negative for congestion and rhinorrhea.  Eyes: Negative for redness and visual disturbance.  Respiratory: Reports complete resolution of shortness of breath   Cardiovascular: Reports complete resolution of chest tightness and palpitations.  Gastrointestinal: Negative  for nausea , vomiting and abdominal pain and  loose stools Genitourinary: Negative for dysuria and urgency.  Endocrine: Denies polyuria, polyphagia and heat intolerance Musculoskeletal: Negative for myalgias and arthralgias.  Skin: Negative for pallor and wound.  Neurological: Negative for dizziness and headaches   SUBJECTIVE:   VITAL SIGNS: BP (!) 117/50   Pulse 77   Temp 98.1 F (36.7 C) (Oral)   Resp 16   Ht _0  (1.626 m)   Wt 107.6 kg   SpO2 94%   BMI 40.72 kg/m   HEMODYNAMICS:    VENTILATOR SETTINGS:    INTAKE / OUTPUT: I/O last 3 completed shifts: In: 360 [P.O.:360] Out: -   PHYSICAL EXAMINATION: General: No acute distress Neuro: Alert and oriented x3, no focal deficits HEENT: PERRLA, trachea midline, no JVD Cardiovascular: Apical pulse regular, S1-S2, no murmur regurg or gallop, +2 pulses bilaterally, +1 edema bilaterally Lungs: Bilateral breath sounds, diminished in the bases, no wheezes or rhonchi Abdomen: Nondistended, normal bowel sounds in all 4 quadrants, palpation reveals no organomegaly Musculoskeletal: Positive range of motion in upper and lower extremities, no joint deformities Skin: Warm and dry  LABS:  BMET Recent Labs  Lab 09/11/17 1132  NA 135   K 3.5  CL 98  CO2 26  BUN 18  CREATININE 1.06*  GLUCOSE 132*    Electrolytes Recent Labs  Lab 09/11/17 1132 09/11/17 1938  CALCIUM 9.4  --   MG  --  2.3  PHOS  --  3.9    CBC Recent Labs  Lab 09/11/17 1132  WBC 6.4  HGB 13.1  HCT 39.5  PLT 366    Coag's Recent Labs  Lab 09/11/17 1132  INR 0.86    Sepsis Markers No results for input(s): LATICACIDVEN, PROCALCITON, O2SATVEN in the last 168 hours.  ABG No results for input(s): PHART, PCO2ART, PO2ART in the last 168 hours.  Liver Enzymes No results for input(s): AST, ALT, ALKPHOS, BILITOT, ALBUMIN in the last 168 hours.  Cardiac Enzymes Recent Labs  Lab 09/11/17 1132 09/11/17 1521 09/11/17 2133  TROPONINI <0.03 0.15* 0.60*    Glucose Recent Labs  Lab 09/11/17 1505  GLUCAP 103*    Imaging Dg Chest Port 1 View  Result Date: 09/11/2017 CLINICAL DATA:  Chest pain EXAM: PORTABLE CHEST 1 VIEW COMPARISON:  09/08/2017 chest radiograph. FINDINGS: Pacer pad overlies the left chest. Stable cardiomediastinal silhouette with normal heart size. No pneumothorax. No pleural effusion. Diffuse prominence of  the parahilar interstitial markings. IMPRESSION: Diffuse prominence of the parahilar interstitial markings, favor pulmonary edema. Electronically Signed   By: Ilona Sorrel M.D.   On: 09/11/2017 11:50     STUDIES:  2D echo pending SIGNIFICANT EVENTS: 09/11/17: Admitted  LINES/TUBES: Peripheral IVs  DISCUSSION: 52 year old female admitted with A. fib with RVR, pulmonary edema and elevated troponin  ASSESSMENT New onset A. fib with RVR Acute pulmonary edema Lower extremity edema Elevated troponin Elevated d-dimer Low TSH  PLAN Now in sinus rhythm, will titrate down diltiazem infusion and converted to p.o. diltiazem Continue Lovenox for anticoagulation; will defer to cardiology for long-term anticoagulation Lasix 40 mg IV x1 and start 20 mg p.o. daily Follow-up 2D echo Trend cardiac enzymes T3 and  T4 with a.m. Labs GI and DVT prophylaxis  FAMILY  - Updates: Patient and spouse updated at bedside   Magdalene S. [facility] ANP-BC Pulmonary and Critical Care Medicine [facility] Pager [phone] or [phone]  NB: This document was prepared using Dragon voice recognition software and may include unintentional dictation errors.    09/12/2017, 1:46 AM

## 2017-09-12 NOTE — Clinical Social Work Note (Signed)
CSW received consult to provide smoking cessation information to patient and her husband. Nursing staff has smoking cessation pamphlets and brochures for this need and they can be given to patient and husband. Physician can also provide smoking cessation information to patient and husband. Please re-consult CSW should a CSW need arise. Shela Leff MSW,LCSW [phone]

## 2017-09-12 NOTE — Progress Notes (Signed)
Pharmacy Electrolyte Monitoring Consult:  Pharmacy consulted to assist in monitoring and replacing electrolytes in this 52 y.o. female admitted on 09/11/2017 with Chest Pain  Labs:  Sodium (mmol/L)  Date Value  09/12/2017 136  05/08/2014 138   Potassium (mmol/L)  Date Value  09/12/2017 4.0  05/08/2014 3.4 (L)   Magnesium (mg/dL)  Date Value  09/11/2017 2.3   Phosphorus (mg/dL)  Date Value  09/11/2017 3.9   Calcium (mg/dL)  Date Value  09/12/2017 8.4 (L)   Calcium, Total (mg/dL)  Date Value  05/08/2014 9.2    Assessment/Plan: Patient ordered furosemide 40mg  IV x 1 on 8/26 and started on furosemide 20mg  PO Daily. Patient ordered potassium 47mEq PO x 1 at 0630 (not documented as administered) and potassium 4mEq PO at 1200 (documented as administered). Potassium is now 4.0. Will recheck electrolytes and magnesium with am labs.   Pharmacy will continue to monitor and adjust per consult.   Rasha Ibe L 09/12/2017 3:56 PM

## 2017-09-12 NOTE — Progress Notes (Addendum)
I reviewed the echocardiogram findings with the patient.  Echocardiogram showed normal ejection fraction.  However, there was significantly high gradient across the mitral valve at 15 mmHg.  The echocardiogram overall was very suboptimal and the mitral valve leaflets were not well visualized.  The patient is not aware of previous history of rheumatic fever.  I do recommend proceeding with a transesophageal echocardiogram to evaluate the mitral valve.  I discussed the procedure in details as well as risks and benefits and will schedule for tomorrow.  I will also go ahead and start the patient on anticoagulation with Xarelto and stop Lovenox.

## 2017-09-12 NOTE — Plan of Care (Signed)
  Problem: Clinical Measurements: Goal: Ability to maintain clinical measurements within normal limits will improve Outcome: Progressing   Problem: Education: Goal: Knowledge of disease or condition will improve Outcome: Progressing Goal: Understanding of medication regimen will improve Outcome: Progressing   Problem: Activity: Goal: Ability to tolerate increased activity will improve Outcome: Progressing

## 2017-09-12 NOTE — Progress Notes (Signed)
*  PRELIMINARY RESULTS* Echocardiogram 2D Echocardiogram has been performed.  Leslie [name] 09/12/2017, 10:15 AM

## 2017-09-12 NOTE — Progress Notes (Signed)
Progress Note  Patient Name: Leslie [name] Date of Encounter: 09/12/2017  Primary Cardiologist: new ( seen by [facility])  Subjective   She converted to sinus rhythm last night around 10 PM.  She feels significantly better with less shortness of breath.  Inpatient Medications    Scheduled Meds: . atorvastatin  20 mg Oral q1800  . buPROPion  100 mg Oral Daily  . diltiazem  30 mg Oral Q6H  . enoxaparin (LOVENOX) injection  1 mg/kg Subcutaneous Q12H  . furosemide  20 mg Oral Daily  . loratadine  10 mg Oral Daily  . multivitamin with minerals  1 tablet Oral Daily  . nicotine  21 mg Transdermal Daily  . pantoprazole  40 mg Oral Daily  . potassium chloride  60 mEq Oral Once  . potassium chloride  60 mEq Oral Once   Continuous Infusions: . sodium chloride     PRN Meds: acetaminophen **OR** acetaminophen, albuterol, ALPRAZolam, fluticasone, ondansetron **OR** ondansetron (ZOFRAN) IV   Vital Signs    Vitals:   09/12/17 0400 09/12/17 0500 09/12/17 0600 09/12/17 0800  BP: 132/68 120/64 112/64 (!) 140/53  Pulse: 77 81 80 82  Resp: 16 16 (!) 24 19  Temp: 98.2 F (36.8 C)   98.3 F (36.8 C)  TempSrc: Oral   Oral  SpO2: 97% 97% 92% 96%  Weight:      Height:        Intake/Output Summary (Last 24 hours) at 09/12/2017 0914 Last data filed at 09/12/2017 0600 Gross per 24 hour  Intake 563.12 ml  Output 1595 ml  Net -1031.88 ml   Filed Weights   09/11/17 1120 09/11/17 1505  Weight: 108.9 kg 107.6 kg    Telemetry    Atrial fibrillation converted to normal sinus rhythm- Personally Reviewed  ECG    Not done today.- Personally Reviewed  Physical Exam   GEN: No acute distress.   Neck: No JVD Cardiac: RRR, no murmurs, rubs, or gallops.  Respiratory: Clear to auscultation bilaterally. GI: Soft, nontender, non-distended  MS: No edema; No deformity. Neuro:  Nonfocal  Psych: Normal affect   Labs    Chemistry Recent Labs  Lab 09/11/17 1132 09/12/17 0257  NA 135  136  K 3.5 2.9*  CL 98 101  CO2 26 29  GLUCOSE 132* 127*  BUN 18 17  CREATININE 1.06* 1.05*  CALCIUM 9.4 8.4*  GFRNONAA 60* >60  GFRAA >60 >60  ANIONGAP 11 6     Hematology Recent Labs  Lab 09/11/17 1132 09/12/17 0257  WBC 6.4 6.8  RBC 4.59 4.17  HGB 13.1 11.9*  HCT 39.5 35.7  MCV 86.1 85.4  MCH 28.5 28.5  MCHC 33.1 33.4  RDW 16.4* 16.2*  PLT 366 306    Cardiac Enzymes Recent Labs  Lab 09/11/17 1132 09/11/17 1521 09/11/17 2133 09/12/17 0257  TROPONINI <0.03 0.15* 0.60* 0.48*   No results for input(s): TROPIPOC in the last 168 hours.   BNPNo results for input(s): BNP, PROBNP in the last 168 hours.   DDimer No results for input(s): DDIMER in the last 168 hours.   Radiology    Dg Chest Port 1 View  Result Date: 09/11/2017 CLINICAL DATA:  Chest pain EXAM: PORTABLE CHEST 1 VIEW COMPARISON:  09/08/2017 chest radiograph. FINDINGS: Pacer pad overlies the left chest. Stable cardiomediastinal silhouette with normal heart size. No pneumothorax. No pleural effusion. Diffuse prominence of the parahilar interstitial markings. IMPRESSION: Diffuse prominence of the parahilar interstitial markings, favor pulmonary edema.  Electronically Signed   By: Ilona Sorrel M.D.   On: 09/11/2017 11:50    Cardiac Studies   Echocardiogram is pending  Patient Profile     52 y.o. female with past medical history of morbid obesity, possible sleep apnea, essential hypertension and hyperlipidemia who presented with shortness of breath and chest pain and was found to be in atrial fibrillation with rapid ventricular response.   Assessment & Plan    1.  Atrial fibrillation with rapid ventricular response: The patient converted to normal sinus rhythm overnight. Recommend continuing oral diltiazem for now.  Diltiazem drip was discontinued. If echocardiogram shows reduced EF, will have to switch diltiazem to a beta-blocker. Chads vas score is at least 2 given gender, hypertension and heart  failure. Most likely she will require long-term anticoagulation but I will wait until reviewing echocardiogram.  2.  Elevated troponin: Likely supply demand ischemia in the setting of A. fib with RVR.  If echocardiogram does not show any wall motion abnormalities, will recommend an outpatient nuclear stress test or CTA.  If in the other hand, echo shows wall motion abnormalities, cardiac catheterization will be needed.  3.  Morbid obesity: Recommend screening for sleep apnea.     For questions or updates, please contact [place] Please consult [email or web address] for contact info under Cardiology/STEMI.      Signed, Kathlyn Sacramento, MD  09/12/2017, 9:14 AM

## 2017-09-12 NOTE — Progress Notes (Signed)
[place] at [place] NAME: Leslie [name]    MR#:  [id number]  DATE OF BIRTH:  [birth year]  SUBJECTIVE:  CHIEF COMPLAINT:   Chief Complaint  Patient presents with  . Chest Pain   -Came with A. fib RVR, Cardizem drip this morning.  Transferred to to a, ambulating well and denies any discomfort. -Converted to normal sinus rhythm  REVIEW OF SYSTEMS:  Review of Systems  Constitutional: Negative for chills, fever and malaise/fatigue.  HENT: Negative for congestion, ear discharge, hearing loss and nosebleeds.   Eyes: Negative for blurred vision and double vision.  Respiratory: Positive for cough and shortness of breath. Negative for wheezing.   Cardiovascular: Negative for chest pain and palpitations.  Gastrointestinal: Negative for abdominal pain, constipation, diarrhea, nausea and vomiting.  Genitourinary: Negative for dysuria.  Musculoskeletal: Negative for myalgias.  Neurological: Negative for dizziness, focal weakness, seizures, weakness and headaches.  Psychiatric/Behavioral: Negative for depression.    DRUG ALLERGIES:   Allergies  Allergen Reactions  . Oxycodone Hcl Itching  . Dilaudid [Hydromorphone Hcl] Itching  . Morphine And Related Hives    VITALS:  Blood pressure (!) 141/68, pulse 80, temperature 98.4 F (36.9 C), temperature source Oral, resp. rate 16, height 5\' 4"  (1.626 m), weight 108 kg, SpO2 97 %.  PHYSICAL EXAMINATION:  Physical Exam  GENERAL:  52 y.o.-year-old obese patient lying in the bed with no acute distress.  EYES: Pupils equal, round, reactive to light and accommodation. No scleral icterus. Extraocular muscles intact.  HEENT: Head atraumatic, normocephalic. Oropharynx and nasopharynx clear.  NECK:  Supple, no jugular venous distention. No thyroid enlargement, no tenderness.  LUNGS: Normal breath sounds bilaterally, no wheezing, rales,rhonchi or crepitation. No use of accessory muscles of respiration.    CARDIOVASCULAR: S1, S2 normal. No murmurs, rubs, or gallops.  ABDOMEN: Soft, nontender, nondistended. Bowel sounds present. No organomegaly or mass.  EXTREMITIES: No pedal edema, cyanosis, or clubbing.  NEUROLOGIC: Cranial nerves II through XII are intact. Muscle strength 5/5 in all extremities. Sensation intact. Gait not checked.  PSYCHIATRIC: The patient is alert and oriented x 3.  SKIN: No obvious rash, lesion, or ulcer.    LABORATORY PANEL:   CBC Recent Labs  Lab 09/12/17 0257  WBC 6.8  HGB 11.9*  HCT 35.7  PLT 306   ------------------------------------------------------------------------------------------------------------------  Chemistries  Recent Labs  Lab 09/11/17 1938 09/12/17 0257 09/12/17 1402  NA  --  136  --   K  --  2.9* 4.0  CL  --  101  --   CO2  --  29  --   GLUCOSE  --  127*  --   BUN  --  17  --   CREATININE  --  1.05*  --   CALCIUM  --  8.4*  --   MG 2.3  --   --    ------------------------------------------------------------------------------------------------------------------  Cardiac Enzymes Recent Labs  Lab 09/12/17 0257  TROPONINI 0.48*   ------------------------------------------------------------------------------------------------------------------  RADIOLOGY:  Dg Chest Port 1 View  Result Date: 09/11/2017 CLINICAL DATA:  Chest pain EXAM: PORTABLE CHEST 1 VIEW COMPARISON:  09/08/2017 chest radiograph. FINDINGS: Pacer pad overlies the left chest. Stable cardiomediastinal silhouette with normal heart size. No pneumothorax. No pleural effusion. Diffuse prominence of the parahilar interstitial markings. IMPRESSION: Diffuse prominence of the parahilar interstitial markings, favor pulmonary edema. Electronically Signed   By: Ilona Sorrel M.D.   On: 09/11/2017 11:50    EKG:   Orders placed or  performed during the hospital encounter of 09/11/17  . EKG 12-Lead  . EKG 12-Lead  . ED EKG within 10 minutes  . ED EKG within 10 minutes  .  EKG 12-Lead  . EKG 12-Lead    ASSESSMENT AND PLAN:   52 year old female with past medical history significant for obesity, sleep apnea, hypertension and hyperlipidemia presents to hospital secondary to shortness of breath and chest pain  1.  New onset atrial fibrillation with rapid ventricular response-admitted to ICU, started on Cardizem drip.  Converted to normal sinus rhythm now.  Off the drip and started on oral Cardizem. -Echocardiogram showing increased mitral valve gradient pressures. -Appreciate cardiology consult.  Likely for TEE tomorrow -Started on Xarelto for anticoagulation.  2.  Elevated troponin-likely demand ischemia in the setting of rapid ventricular response.  It is noted on echo.  Outpatient stress test or CT angiogram recommended.  3.  Obesity-no previous diagnosis of sleep apnea-we will need a sleep study as outpatient  4.  Acute pulmonary edema- acute diastolic dysfunction -secondary to A. fib with RVR.  Echo with mild diastolic dysfunction.  Continue IV Lasix for 1 more day  5.  Recent acute bronchitis-continue Levaquin for 4 more days  6.  DVT prophylaxis-started on Xarelto  7.  Hypokalemia-being replaced  Patient seems to be ambulating well in the room.   All the records are reviewed and case discussed with Care Management/Social Workerr. Management plans discussed with the patient, family and they are in agreement.  CODE STATUS: Full code  TOTAL TIME TAKING CARE OF THIS PATIENT: 37 minutes.   POSSIBLE D/C IN 1-2 DAYS, DEPENDING ON CLINICAL CONDITION.   Mohd. Derflinger M.D on 09/12/2017 at 3:10 PM  Between 7am to 6pm - Pager - [phone]  After 6pm go to [email or web address] - password [email or web address] [place] [facility]  Office  [phone]  CC: Primary care physician; Ronnell Freshwater, NP

## 2017-09-12 NOTE — Progress Notes (Signed)
Pt being transferred to room 235. Report called to Anderson Malta, Therapist, sports. Pt and belongings transferred to room 235 without incident.

## 2017-09-13 ENCOUNTER — Encounter
Admission: EM | Disposition: A | Discharge status: Home / Self Care | Payer: Self-pay | Source: Home / Self Care | Attending: Internal Medicine

## 2017-09-13 ENCOUNTER — Inpatient Hospital Stay (HOSPITAL_COMMUNITY)
Admit: 2017-09-13 | Discharge: 2017-09-13 | Disposition: A | Discharge status: Home / Self Care | Payer: BLUE CROSS/BLUE SHIELD | Attending: Cardiovascular Disease | Admitting: Cardiovascular Disease

## 2017-09-13 DIAGNOSIS — I248 Other forms of acute ischemic heart disease: Secondary | ICD-10-CM

## 2017-09-13 DIAGNOSIS — I480 Paroxysmal atrial fibrillation: Principal | ICD-10-CM

## 2017-09-13 DIAGNOSIS — I050 Rheumatic mitral stenosis: Secondary | ICD-10-CM

## 2017-09-13 DIAGNOSIS — I342 Nonrheumatic mitral (valve) stenosis: Secondary | ICD-10-CM

## 2017-09-13 HISTORY — PX: TEE WITHOUT CARDIOVERSION: SHX5443

## 2017-09-13 LAB — MAGNESIUM: Magnesium: 2.2 mg/dL (ref 1.7–2.4)

## 2017-09-13 LAB — T4: T4, Total: 8.1 ug/dL (ref 4.5–12.0)

## 2017-09-13 LAB — BASIC METABOLIC PANEL
Anion gap: 9 (ref 5–15)
BUN: 23 mg/dL — ABNORMAL HIGH (ref 6–20)
CO2: 28 mmol/L (ref 22–32)
Calcium: 9.3 mg/dL (ref 8.9–10.3)
Chloride: 103 mmol/L (ref 98–111)
Creatinine, Ser: 1.11 mg/dL — ABNORMAL HIGH (ref 0.44–1.00)
GFR calc Af Amer: >60 mL/min (ref >60)
GFR calc non Af Amer: 56 mL/min — ABNORMAL LOW (ref >60)
Glucose, Bld: 110 mg/dL — ABNORMAL HIGH (ref 70–99)
Potassium: 3.9 mmol/L (ref 3.5–5.1)
Sodium: 140 mmol/L (ref 135–145)

## 2017-09-13 LAB — T3: T3, Total: 153 ng/dL (ref 71–180)

## 2017-09-13 LAB — CULTURE, URINE COMPREHENSIVE

## 2017-09-13 SURGERY — ECHOCARDIOGRAM, TRANSESOPHAGEAL
Anesthesia: Moderate Sedation

## 2017-09-13 MED ORDER — DILTIAZEM HCL ER COATED BEADS 180 MG PO CP24: 180.0000 mg | ORAL_CAPSULE | Freq: Every day | ORAL | 2 refills | Status: DC

## 2017-09-13 MED ORDER — NICOTINE 21 MG/24HR TD PT24: 21.0000 mg | MEDICATED_PATCH | Freq: Every day | TRANSDERMAL | 0 refills | Status: DC

## 2017-09-13 MED ORDER — ENOXAPARIN SODIUM 120 MG/0.8ML ~~LOC~~ SOLN: 1.0000 mg/kg | Freq: Two times a day (BID) | SUBCUTANEOUS | 0 refills | Status: DC

## 2017-09-13 MED ORDER — DILTIAZEM HCL ER COATED BEADS 180 MG PO CP24: 180.0000 mg | ORAL_CAPSULE | Freq: Every day | ORAL | Status: DC

## 2017-09-13 MED ORDER — SODIUM CHLORIDE 0.9 % IV SOLN: INTRAVENOUS | Status: DC

## 2017-09-13 MED ORDER — WARFARIN - PHARMACIST DOSING INPATIENT: Freq: Every day | Status: DC

## 2017-09-13 MED ORDER — MENTHOL 3 MG MT LOZG
1.0000 | LOZENGE | OROMUCOSAL | Status: DC | PRN
  Filled 2017-09-13: qty 9

## 2017-09-13 MED ORDER — DILTIAZEM HCL ER COATED BEADS 120 MG PO CP24: 120.0000 mg | ORAL_CAPSULE | Freq: Every day | ORAL | Status: DC

## 2017-09-13 MED ORDER — FUROSEMIDE 20 MG PO TABS: 20.0000 mg | ORAL_TABLET | Freq: Every day | ORAL | 2 refills | Status: DC

## 2017-09-13 MED ORDER — WARFARIN SODIUM 5 MG PO TABS: 5.0000 mg | ORAL_TABLET | Freq: Every day | ORAL | 0 refills | Status: DC

## 2017-09-13 MED ORDER — WARFARIN SODIUM 5 MG PO TABS
5.0000 mg | ORAL_TABLET | Freq: Once | ORAL | Status: AC
  Administered 2017-09-13: 5 mg via ORAL
  Filled 2017-09-13: qty 1

## 2017-09-13 MED ORDER — FENTANYL CITRATE (PF) 100 MCG/2ML IJ SOLN
INTRAMUSCULAR | Status: AC
  Administered 2017-09-13: 09:00:00
  Filled 2017-09-13: qty 2

## 2017-09-13 MED ORDER — PHENOL 1.4 % MT LIQD
1.0000 | OROMUCOSAL | Status: DC | PRN
  Administered 2017-09-13: 1 via OROMUCOSAL
  Filled 2017-09-13: qty 177

## 2017-09-13 MED ORDER — FENTANYL CITRATE (PF) 100 MCG/2ML IJ SOLN
INTRAMUSCULAR | Status: AC | PRN
  Administered 2017-09-13 (×4): 25 ug via INTRAVENOUS

## 2017-09-13 MED ORDER — SODIUM CHLORIDE FLUSH 0.9 % IV SOLN
INTRAVENOUS | Status: AC
  Filled 2017-09-13: qty 10

## 2017-09-13 MED ORDER — LIDOCAINE VISCOUS HCL 2 % MT SOLN
OROMUCOSAL | Status: AC
  Administered 2017-09-13: 15 mL via ORAL
  Filled 2017-09-13: qty 15

## 2017-09-13 MED ORDER — MIDAZOLAM HCL 5 MG/5ML IJ SOLN
INTRAMUSCULAR | Status: AC
  Administered 2017-09-13: 08:00:00
  Filled 2017-09-13: qty 5

## 2017-09-13 MED ORDER — TRAMADOL HCL 50 MG PO TABS: 50.0000 mg | ORAL_TABLET | Freq: Four times a day (QID) | ORAL | 0 refills | Status: DC | PRN

## 2017-09-13 MED ORDER — BUTAMBEN-TETRACAINE-BENZOCAINE 2-2-14 % EX AERO
INHALATION_SPRAY | CUTANEOUS | Status: AC
  Filled 2017-09-13: qty 5

## 2017-09-13 MED ORDER — ENOXAPARIN SODIUM 120 MG/0.8ML ~~LOC~~ SOLN
1.0000 mg/kg | Freq: Two times a day (BID) | SUBCUTANEOUS | Status: DC
  Administered 2017-09-13: 110 mg via SUBCUTANEOUS
  Filled 2017-09-13: qty 0.8

## 2017-09-13 MED ORDER — TRAMADOL HCL 50 MG PO TABS
50.0000 mg | ORAL_TABLET | Freq: Four times a day (QID) | ORAL | Status: DC | PRN
  Administered 2017-09-13: 50 mg via ORAL
  Filled 2017-09-13: qty 1

## 2017-09-13 MED ORDER — FENTANYL CITRATE (PF) 100 MCG/2ML IJ SOLN
INTRAMUSCULAR | Status: AC
  Filled 2017-09-13: qty 2

## 2017-09-13 MED ORDER — MIDAZOLAM HCL 2 MG/2ML IJ SOLN
INTRAMUSCULAR | Status: AC | PRN
  Administered 2017-09-13: 2 mg via INTRAVENOUS

## 2017-09-13 MED ORDER — MIDAZOLAM HCL 5 MG/5ML IJ SOLN
INTRAMUSCULAR | Status: AC | PRN
  Administered 2017-09-13 (×2): 1 mg via INTRAVENOUS

## 2017-09-13 NOTE — Progress Notes (Signed)
Leslie [name] at [place] NAME: Leslie [name]    MR#:  [id number]  DATE OF BIRTH:  February 09, 1965  SUBJECTIVE:  CHIEF COMPLAINT:   Chief Complaint  Patient presents with  . Chest Pain   -Status post TEE showing mitral valve stenosis.  Started on Coumadin and Lovenox. -Complains of some right shoulder pain and cough  REVIEW OF SYSTEMS:  Review of Systems  Constitutional: Negative for chills, fever and malaise/fatigue.  HENT: Negative for congestion, ear discharge, hearing loss and nosebleeds.   Eyes: Negative for blurred vision and double vision.  Respiratory: Positive for cough and shortness of breath. Negative for wheezing.   Cardiovascular: Negative for chest pain and palpitations.  Gastrointestinal: Negative for abdominal pain, constipation, diarrhea, nausea and vomiting.  Genitourinary: Negative for dysuria.  Musculoskeletal: Positive for myalgias.  Neurological: Negative for dizziness, focal weakness, seizures, weakness and headaches.  Psychiatric/Behavioral: Negative for depression.    DRUG ALLERGIES:   Allergies  Allergen Reactions  . Oxycodone Hcl Itching  . Dilaudid [Hydromorphone Hcl] Itching  . Morphine And Related Hives    VITALS:  Blood pressure 128/69, pulse 78, temperature 98.3 F (36.8 C), temperature source Oral, resp. rate 16, height 5\' 4"  (1.626 m), weight 108.2 kg, SpO2 98 %.  PHYSICAL EXAMINATION:  Physical Exam  GENERAL:  52 y.o.-year-old obese patient lying in the bed with no acute distress.  EYES: Pupils equal, round, reactive to light and accommodation. No scleral icterus. Extraocular muscles intact.  HEENT: Head atraumatic, normocephalic. Oropharynx and nasopharynx clear.  NECK:  Supple, no jugular venous distention. No thyroid enlargement, no tenderness.  LUNGS: Normal breath sounds bilaterally, no wheezing, rales,rhonchi or crepitation. No use of accessory muscles of respiration.  CARDIOVASCULAR: S1, S2  normal. No murmurs, rubs, or gallops.  ABDOMEN: Soft, nontender, nondistended. Bowel sounds present. No organomegaly or mass.  EXTREMITIES: No pedal edema, cyanosis, or clubbing.  NEUROLOGIC: Cranial nerves II through XII are intact. Muscle strength 5/5 in all extremities. Sensation intact. Gait not checked.  PSYCHIATRIC: The patient is alert and oriented x 3.  SKIN: No obvious rash, lesion, or ulcer.    LABORATORY PANEL:   CBC Recent Labs  Lab 09/12/17 0257  WBC 6.8  HGB 11.9*  HCT 35.7  PLT 306   ------------------------------------------------------------------------------------------------------------------  Chemistries  Recent Labs  Lab 09/13/17 0747  NA 140  K 3.9  CL 103  CO2 28  GLUCOSE 110*  BUN 23*  CREATININE 1.11*  CALCIUM 9.3  MG 2.2   ------------------------------------------------------------------------------------------------------------------  Cardiac Enzymes Recent Labs  Lab 09/12/17 0257  TROPONINI 0.48*   ------------------------------------------------------------------------------------------------------------------  RADIOLOGY:  No results found.  EKG:   Orders placed or performed during the hospital encounter of 09/11/17  . EKG 12-Lead  . EKG 12-Lead  . ED EKG within 10 minutes  . ED EKG within 10 minutes  . EKG 12-Lead  . EKG 12-Lead    ASSESSMENT AND PLAN:   52 year old female with past medical history significant for obesity, sleep apnea, hypertension and hyperlipidemia presents to hospital secondary to shortness of breath and chest pain  1.  New onset atrial fibrillation with rapid ventricular response- -possible valvular A. fib.  Echocardiogram with mitral valve thickening and TEE showing mitral stenosis-moderate.  No history of rheumatic fever -Appreciate cardiology consult.  Recommend Coumadin and bridging with Lovenox. -Started on Cardizem-change to long-acting Cardizem. -Converted to normal sinus rhythm now.  2.   Elevated troponin-likely demand ischemia in the setting of  rapid ventricular response.  It is noted on echo.  Outpatient stress test or CT angiogram recommended.  3.  Obesity-no previous diagnosis of sleep apnea-we will need a sleep study as outpatient  4.  Acute pulmonary edema- acute diastolic dysfunction -secondary to A. fib with RVR.  Echo with mild diastolic dysfunction.   -Received IV Lasix on admission, changed to oral Lasix at discharge  5.  Recent acute bronchitis-continue Levaquin for 3 more days  6.  DVT prophylaxis-on Lovenox and Coumadin  7.  Hypokalemia-replaced  Believe patient and possible discharge home if Lovenox can be arranged   All the records are reviewed and case discussed with Care Management/Social Workerr. Management plans discussed with the patient, family and they are in agreement.  CODE STATUS: Full code  TOTAL TIME TAKING CARE OF THIS PATIENT: 37 minutes.   POSSIBLE D/C TODAY, DEPENDING ON CLINICAL CONDITION.   Gladstone Lighter M.D on 09/13/2017 at 1:59 PM  Between 7am to 6pm - Pager - [phone]  After 6pm go to [email or web address] - password [email or web address] [place] [facility]  Office  [phone]  CC: Primary care physician; Ronnell Freshwater, NP

## 2017-09-13 NOTE — Progress Notes (Signed)
Pharmacy Electrolyte Monitoring Consult:  Pharmacy consulted to assist in monitoring and replacing electrolytes in this 52 y.o. female admitted on 09/11/2017 with Chest Pain  Labs:  Sodium (mmol/L)  Date Value  09/13/2017 140  05/08/2014 138   Potassium (mmol/L)  Date Value  09/13/2017 3.9  05/08/2014 3.4 (L)   Magnesium (mg/dL)  Date Value  09/13/2017 2.2   Phosphorus (mg/dL)  Date Value  09/11/2017 3.9   Calcium (mg/dL)  Date Value  09/13/2017 9.3   Calcium, Total (mg/dL)  Date Value  05/08/2014 9.2    Assessment/Plan: 0827 K 3.9          Mg 2.2 Patient  started on furosemide 20mg  PO Daily on 0826. No need for electrolyte replacement today.  Will recheck electrolytes and magnesium with am labs.   Pharmacy will continue to monitor and adjust per consult.    Paticia Stack, PharmD Pharmacy Resident  09/13/2017 8:26 AM

## 2017-09-13 NOTE — H&P (View-Only) (Signed)
Progress Note  Patient Name: Leslie [name] Date of Encounter: 09/13/2017  Primary Cardiologist: new to [facility] - consult by [place] in sinus rhythm. She is for TEE this morning. Feels close to her baseline. Continues to note palpitations (NSR noted during these episodes).  Inpatient Medications    Scheduled Meds: . atorvastatin  20 mg Oral q1800  . buPROPion  100 mg Oral Daily  . diltiazem  30 mg Oral Q6H  . furosemide  20 mg Oral Daily  . levofloxacin  500 mg Oral q1800  . loratadine  10 mg Oral Daily  . multivitamin with minerals  1 tablet Oral Daily  . nicotine  21 mg Transdermal Daily  . pantoprazole  40 mg Oral Daily  . rivaroxaban  20 mg Oral Q supper   Continuous Infusions:  PRN Meds: acetaminophen **OR** acetaminophen, albuterol, ALPRAZolam, fluticasone, ondansetron **OR** ondansetron (ZOFRAN) IV, traMADol   Vital Signs    Vitals:   09/12/17 1929 09/13/17 0409 09/13/17 0411 09/13/17 0726  BP: 131/73 139/80  (!) 153/75  Pulse: 70 73  84  Resp: 18 18  19   Temp: 98 F (36.7 C) 97.8 F (36.6 C)  97.9 F (36.6 C)  TempSrc: Oral Oral  Oral  SpO2: 100% 98%  100%  Weight:   108.2 kg   Height:        Intake/Output Summary (Last 24 hours) at 09/13/2017 0756 Last data filed at 09/13/2017 0411 Gross per 24 hour  Intake 355.48 ml  Output 1600 ml  Net -1244.52 ml   Filed Weights   09/11/17 1505 09/12/17 1226 09/13/17 0411  Weight: 107.6 kg 108 kg 108.2 kg    Telemetry    NSR - Personally Reviewed  ECG    n/a - Personally Reviewed  Physical Exam   GEN: No acute distress.   Neck: No JVD. Cardiac: RRR, II/VI diastolic murmur at the apex, no rubs, or gallops.  Respiratory: Clear to auscultation bilaterally.  GI: Soft, nontender, non-distended.   MS: No edema; No deformity. Neuro:  Alert and oriented x 3; Nonfocal.  Psych: Normal affect.  Labs    Chemistry Recent Labs  Lab 09/11/17 1132 09/12/17 0257 09/12/17 1402  NA  135 136  --   K 3.5 2.9* 4.0  CL 98 101  --   CO2 26 29  --   GLUCOSE 132* 127*  --   BUN 18 17  --   CREATININE 1.06* 1.05*  --   CALCIUM 9.4 8.4*  --   GFRNONAA 60* >60  --   GFRAA >60 >60  --   ANIONGAP 11 6  --      Hematology Recent Labs  Lab 09/11/17 1132 09/12/17 0257  WBC 6.4 6.8  RBC 4.59 4.17  HGB 13.1 11.9*  HCT 39.5 35.7  MCV 86.1 85.4  MCH 28.5 28.5  MCHC 33.1 33.4  RDW 16.4* 16.2*  PLT 366 306    Cardiac Enzymes Recent Labs  Lab 09/11/17 1132 09/11/17 1521 09/11/17 2133 09/12/17 0257  TROPONINI <0.03 0.15* 0.60* 0.48*   No results for input(s): TROPIPOC in the last 168 hours.   BNPNo results for input(s): BNP, PROBNP in the last 168 hours.   DDimer No results for input(s): DDIMER in the last 168 hours.   Radiology    Dg Chest Port 1 View  Result Date: 09/11/2017 IMPRESSION: Diffuse prominence of the parahilar interstitial markings, favor pulmonary edema. Electronically Signed   By:  Ilona Sorrel M.D.   On: 09/11/2017 11:50    Cardiac Studies   2-D echo 09/12/2017: Study Conclusions  - Left ventricle: The cavity size was normal. Wall thickness was   normal. Systolic function was normal. The estimated ejection   fraction was in the range of 55% to 60%. Wall motion was normal;   there were no regional wall motion abnormalities. Features are   consistent with a pseudonormal left ventricular filling pattern,   with concomitant abnormal relaxation and increased filling   pressure (grade 2 diastolic dysfunction). - Mitral valve: Poorly visualized. The findings are consistent with   moderate stenosis. Mean gradient (D): 15 mm Hg. Valve area by   pressure half-time: 1.62 cm^2. Valve area by continuity equation   (using LVOT flow): 2.15 cm^2.  Impressions:  - Significantly elevated mitral valve gradient but the valve is not   well visualized. Left atrial size is normal which argues against   severe stenosis. Consider a TEE if clinically  indicated.  Patient Profile     52 y.o. female with history of morbid obesity, possible sleep apnea, essential hypertension and hyperlipidemia who presented with shortness of breath and chest pain and was found to be in atrial fibrillation with rapid ventricular response.  Assessment & Plan    1. New onset with Afib with RVR: -Remains in sinus rhythm  -Consolidate short acting diltiazem to long-acting  -She has been noted to have moderate mitral stenosis on TTE. If TEE confirms this, she will need to be placed on Coumadin rather than a DOAC as this would be valvular Afib. She will need either a heparin bridge if she is going to remain inpatient or could potentially do Lovenox bridge to Coumadin at home   2. Mitral stenosis: -Likely contributing to her Afib above -TEE this morning to further evaluate  3. Elevated troponin: -Likely supply demand ischemia in the setting of the above -Plan for outpatient ischemic evaluation with a nuclear stress testing or cardiac CTA  4. Morbid obesity: -Needs outpatient sleep study    For questions or updates, please contact [place] Please consult [email or web address] for contact info under Cardiology/STEMI.    Signed, Christell Faith, PA-C [place] Pager: [phone] 09/13/2017, 7:56 AM

## 2017-09-13 NOTE — Consult Note (Signed)
ANTICOAGULATION CONSULT NOTE - Initial Consult  Pharmacy Consult for Warfarin Indication: mitral valve stenosis  Allergies  Allergen Reactions  . Oxycodone Hcl Itching  . Dilaudid [Hydromorphone Hcl] Itching  . Morphine And Related Hives    Patient Measurements: Height: '5\' 4"'  (162.6 cm) Weight: 238 lb 9.6 oz (108.2 kg) IBW/kg (Calculated) : 54.7   Vital Signs: Temp: 97.9 F (36.6 C) (08/27 0726) Temp Source: Oral (08/27 0726) BP: 153/75 (08/27 0726) Pulse Rate: 84 (08/27 0726)  Labs: Recent Labs    09/11/17 1132 09/11/17 1521 09/11/17 2133 09/12/17 0257 09/13/17 0747  HGB 13.1  --   --  11.9*  --   HCT 39.5  --   --  35.7  --   PLT 366  --   --  306  --   LABPROT 11.6  --   --   --   --   INR 0.86  --   --   --   --   CREATININE 1.06*  --   --  1.05* 1.11*  TROPONINI <0.03 0.15* 0.60* 0.48*  --     Estimated Creatinine Clearance: 72 mL/min (A) (by C-G formula based on SCr of 1.11 mg/dL (H)).   Medical History: Past Medical History:  Diagnosis Date  . Allergy   . Anemia    h/o  . Anxiety   . BRCA negative 03/22/2013  . Bronchitis 02/19/2016   ON LEVAQUIN PO  . Cigarette smoker 09/11/2017   8-10 day  . GERD (gastroesophageal reflux disease)   . History of kidney stones   . Hyperlipidemia   . Hypertension     Medications:  Scheduled:  . atorvastatin  20 mg Oral q1800  . buPROPion  100 mg Oral Daily  . diltiazem  120 mg Oral Daily  . enoxaparin (LOVENOX) injection  1 mg/kg Subcutaneous Q12H  . furosemide  20 mg Oral Daily  . levofloxacin  500 mg Oral q1800  . loratadine  10 mg Oral Daily  . multivitamin with minerals  1 tablet Oral Daily  . nicotine  21 mg Transdermal Daily  . pantoprazole  40 mg Oral Daily    Assessment: Patient presented with chest pain upon admission, and was found to have afib with RVR. Was originally started on Xarelto on 0826; however, was found to have moderate mitral stenosis on TTE. TEE scheduled 0827.   Plan:   INR goal 2-3 Initiating warfarin 5 mg PO @ 1800. Patient is also receiving treatment dose Lovenox 110 mg SQ Q12 for bridging as this is higher risk indication for clot (mitral valve stenosis). Current potential interacting medication: Levaquin. Will follow daily INRs.  Pharmacy will continue to monitor.  Paticia Stack, PharmD Pharmacy Resident  09/13/2017 9:07 AM  Paticia Stack 09/13/2017,8:59 AM

## 2017-09-13 NOTE — Care Management Note (Signed)
Case Management Note  Patient Details  Name: Leslie [name] MRN: [id number] Date of Birth: 1965/08/20  Subjective/Objective:        Action/Plan: Plan to discharge this afternoon.  Will discharge on Coumadin and enoxaparin injections twice a day for 5 days.  Called in prescription for [facility] to [facility] on [address].  Prescription will cost $184.49 for 5 day supply.  [facility] and will have the other 4 by tomorrow.  Spoke with patient and she says she can pay for the medicine and will pick up this evening. Notified patient about limited supply available today but can pick up the rest tomorrow per pharmacy.   Spoke with nurse and injection teaching has been completed. Patient knowledgeable about injections. Placed prescription in chart.             Expected Discharge Date:  09/13/17               Expected Discharge Plan:     In-House Referral:     Discharge planning Services     Post Acute Care Choice:    Choice offered to:     DME Arranged:    DME Agency:     HH Arranged:    HH Agency:     Status of Service:     If discussed at [employer], dates discussed:    Additional Comments:  Elza Rafter, RN 09/13/2017, 3:08 PM

## 2017-09-13 NOTE — Progress Notes (Signed)
    Mr. Leslie [name] has been at [facility] from 09/11/17 to 09/13/17 assisting his wife Ms.[name]  Who was admitted to the Hospital for an acute cardiac condition  So, please excuse him from work during this period..  Call Gladstone Lighter  MD, [facility] at  [phone] with questions.  Gladstone Lighter M.D on 09/13/2017,at 3:36 PM  [facility] [address], [place] [address]

## 2017-09-13 NOTE — CV Procedure (Signed)
    Transesophageal Echocardiogram Note  Leslie [name] [id number] [birth year]  Procedure: Transesophageal Echocardiogram Indications: Mitral stenosis and paroxysmal atrial fibrillation  Procedure Details Consent: Obtained Time Out: Verified patient identification, verified procedure, site/side was marked, verified correct patient position, special equipment/implants available, Radiology Safety Procedures followed,  medications/allergies/relevent history reviewed, required imaging and test results available.  Performed  Medications:  During this procedure the patient is administered a total of Versed 4 mg and Fentanyl 100 mcg  to achieve and maintain moderate conscious sedation.  The patient's heart rate, blood pressure, and oxygen saturation are monitored continuously during the procedure. The period of conscious sedation is 31 minutes, of which I was present face-to-face 100% of this time.  Left Ventrical:  Normal size and LVEF.  Mitral Valve: Thickened with limited mobility (posterior more restricted than anterior).  Probably moderate mitral stenosis with mild regurgitation.  Aortic Valve: Trileaflet with mild thickening.  Trival AI.  Tricuspid Valve: Normal.  Pulmonic Valve: Normal with trivial PR.  Left Atrium/ Left atrial appendage: No thrombus.  Prominent Coumadin ridge.  Atrial septum: Intact by color Doppler.  Aorta: Normal.  Complications: No apparent complications Patient did tolerate procedure well.  Nelva Bush, MD 09/13/2017, 10:20 AM

## 2017-09-13 NOTE — Progress Notes (Signed)
Pt discharged home today with husband. Pt and husband provided teaching and education concerning lovenox injections and were able to perform the injection prior to discharge. They were also provided follow up appointment information and medication regimen/new prescription information. VSS, no complaints at this time.

## 2017-09-13 NOTE — Discharge Summary (Signed)
[facility] NAME: Leslie [name]    MR#:  [id number]  DATE OF BIRTH:  Feb 20, 1965  DATE OF ADMISSION:  09/11/2017   ADMITTING PHYSICIAN: Dustin Flock, MD  DATE OF DISCHARGE:  09/13/17  PRIMARY CARE PHYSICIAN: Ronnell Freshwater, NP   ADMISSION DIAGNOSIS:   Atrial fibrillation with RVR ([place]) [I48.91] Chest pain [R07.9] A-fib ([place]) [I48.91]  DISCHARGE DIAGNOSIS:   Active Problems:   A-fib (HCC)   Mitral valve stenosis   SECONDARY DIAGNOSIS:   Past Medical History:  Diagnosis Date  . Allergy   . Anemia    h/o  . Anxiety   . BRCA negative 03/22/2013  . Bronchitis 02/19/2016   ON LEVAQUIN PO  . Cigarette smoker 09/11/2017   8-10 day  . GERD (gastroesophageal reflux disease)   . History of kidney stones   . Hyperlipidemia   . Hypertension     HOSPITAL COURSE:   52 year old female with past medical history significant for obesity, sleep apnea, hypertension and hyperlipidemia presents to hospital secondary to shortness of breath and chest pain  1.  New onset atrial fibrillation with rapid ventricular response- -possible valvular A. fib.  Echocardiogram with mitral valve thickening and TEE showing mitral stenosis-moderate.  No history of rheumatic fever -Appreciate cardiology consult.  Recommend Coumadin and bridging with Lovenox. -Started on Cardizem-change to long-acting Cardizem. -Converted to normal sinus rhythm now.  2.  Elevated troponin-likely demand ischemia in the setting of rapid ventricular response.  It is noted on echo.  Outpatient stress test or CT angiogram recommended.  3.  Obesity-no previous diagnosis of sleep apnea-we will need a sleep study as outpatient  4.  Acute pulmonary edema- acute diastolic dysfunction -secondary to A. fib with RVR.  Echo with mild diastolic dysfunction.   -Received IV Lasix on admission, changed to oral Lasix at discharge  5.  Recent acute bronchitis-continue  Levaquin for 3 more days- will interfere with coumadin and INR check- close monitoring needed  Patient is ambulatory and being discharged home today  DISCHARGE CONDITIONS:   Guarded  CONSULTS OBTAINED:   Treatment Team:  Minna Merritts, MD  DRUG ALLERGIES:   Allergies  Allergen Reactions  . Oxycodone Hcl Itching  . Dilaudid [Hydromorphone Hcl] Itching  . Morphine And Related Hives   DISCHARGE MEDICATIONS:   Allergies as of 09/13/2017      Reactions   Oxycodone Hcl Itching   Dilaudid [hydromorphone Hcl] Itching   Morphine And Related Hives      Medication List    STOP taking these medications   buPROPion 100 MG 12 hr tablet Commonly known as:  WELLBUTRIN SR   FIRST-DUKES MOUTHWASH Susp   fluconazole 150 MG tablet Commonly known as:  DIFLUCAN   hydrochlorothiazide 25 MG tablet Commonly known as:  HYDRODIURIL   naproxen sodium 220 MG tablet Commonly known as:  ALEVE   tiZANidine 2 MG tablet Commonly known as:  ZANAFLEX     TAKE these medications   albuterol 108 (90 Base) MCG/ACT inhaler Commonly known as:  PROVENTIL HFA;VENTOLIN HFA Inhale 2 puffs into the lungs every 6 (six) hours as needed for wheezing or shortness of breath.   ALPRAZolam 0.5 MG tablet Commonly known as:  XANAX Take 0.5 mg by mouth 2 (two) times daily as needed for anxiety. Take 1 tab po by twice a day as needed  For anxiety   aspirin EC 81 MG tablet Take 81 mg by mouth daily.  atorvastatin 20 MG tablet Commonly known as:  LIPITOR Take 1 tablet (20 mg total) by mouth daily at 6 PM.   cetirizine 10 MG tablet Commonly known as:  ZYRTEC Take 1 tablet (10 mg total) by mouth daily.   chlorpheniramine-HYDROcodone 10-8 MG/5ML Suer Commonly known as:  TUSSIONEX Take 5 mLs by mouth every 12 (twelve) hours as needed for cough.   diltiazem 180 MG 24 hr capsule Commonly known as:  CARDIZEM CD Take 1 capsule (180 mg total) by mouth daily. Start taking on:  09/14/2017   enoxaparin  120 MG/0.8ML injection Commonly known as:  LOVENOX Inject 0.72 mLs (110 mg total) into the skin every 12 (twelve) hours for 5 days.   estradiol 0.5 MG tablet Commonly known as:  ESTRACE Take 1 tablet (0.5 mg total) by mouth daily.   fluticasone 50 MCG/ACT nasal spray Commonly known as:  FLONASE Place 2 sprays into both nostrils daily as needed for allergies or rhinitis.   furosemide 20 MG tablet Commonly known as:  LASIX Take 1 tablet (20 mg total) by mouth daily. Start taking on:  09/14/2017   levofloxacin 500 MG tablet Commonly known as:  LEVAQUIN Take 1 tablet (500 mg total) by mouth daily.   montelukast 10 MG tablet Commonly known as:  SINGULAIR Take 1 tablet (10 mg total) by mouth at bedtime.   multivitamin capsule Take 1 capsule by mouth daily.   nicotine 21 mg/24hr patch Commonly known as:  NICODERM CQ - dosed in mg/24 hours Place 1 patch (21 mg total) onto the skin daily. Start taking on:  09/14/2017   nystatin cream Commonly known as:  MYCOSTATIN Apply 1 application topically 3 (three) times daily.   omeprazole 40 MG capsule Commonly known as:  PRILOSEC Take 1 capsule (40 mg total) by mouth daily.   traMADol 50 MG tablet Commonly known as:  ULTRAM Take 1 tablet (50 mg total) by mouth every 6 (six) hours as needed for moderate pain.   warfarin 5 MG tablet Commonly known as:  COUMADIN Take 1 tablet (5 mg total) by mouth daily.        DISCHARGE INSTRUCTIONS:   1. PCP f/u in 1 week 2. Cardiology f/u in 1-2 weeks 3. INR check with coumadin clinic to be set up in 2-3 days  DIET:   Cardiac diet  ACTIVITY:   Activity as tolerated  OXYGEN:   Home Oxygen: No.  Oxygen Delivery: room air  DISCHARGE LOCATION:   home   If you experience worsening of your admission symptoms, develop shortness of breath, life threatening emergency, suicidal or homicidal thoughts you must seek medical attention immediately by calling 911 or calling your MD immediately   if symptoms less severe.  You Must read complete instructions/literature along with all the possible adverse reactions/side effects for all the Medicines you take and that have been prescribed to you. Take any new Medicines after you have completely understood and accpet all the possible adverse reactions/side effects.   Please note  You were cared for by a hospitalist during your hospital stay. If you have any questions about your discharge medications or the care you received while you were in the hospital after you are discharged, you can call the unit and asked to speak with the hospitalist on call if the hospitalist that took care of you is not available. Once you are discharged, your primary care physician will handle any further medical issues. Please note that NO REFILLS for any discharge medications will  be authorized once you are discharged, as it is imperative that you return to your primary care physician (or establish a relationship with a primary care physician if you do not have one) for your aftercare needs so that they can reassess your need for medications and monitor your lab values.    On the day of Discharge:  VITAL SIGNS:   Blood pressure 128/69, pulse 78, temperature 98.3 F (36.8 C), temperature source Oral, resp. rate 16, height _0  (1.626 m), weight 108.2 kg, SpO2 98 %.  PHYSICAL EXAMINATION:   GENERAL:  52 y.o.-year-old obese patient lying in the bed with no acute distress.  EYES: Pupils equal, round, reactive to light and accommodation. No scleral icterus. Extraocular muscles intact.  HEENT: Head atraumatic, normocephalic. Oropharynx and nasopharynx clear.  NECK:  Supple, no jugular venous distention. No thyroid enlargement, no tenderness.  LUNGS: Normal breath sounds bilaterally, no wheezing, rales,rhonchi or crepitation. No use of accessory muscles of respiration.  CARDIOVASCULAR: S1, S2 normal. No murmurs, rubs, or gallops.  ABDOMEN: Soft, nontender,  nondistended. Bowel sounds present. No organomegaly or mass.  EXTREMITIES: No pedal edema, cyanosis, or clubbing.  NEUROLOGIC: Cranial nerves II through XII are intact. Muscle strength 5/5 in all extremities. Sensation intact. Gait not checked.  PSYCHIATRIC: The patient is alert and oriented x 3.  SKIN: No obvious rash, lesion, or ulcer.   DATA REVIEW:   CBC Recent Labs  Lab 09/12/17 0257  WBC 6.8  HGB 11.9*  HCT 35.7  PLT 306    Chemistries  Recent Labs  Lab 09/13/17 0747  NA 140  K 3.9  CL 103  CO2 28  GLUCOSE 110*  BUN 23*  CREATININE 1.11*  CALCIUM 9.3  MG 2.2     Microbiology Results  Results for orders placed or performed during the hospital encounter of 09/11/17  MRSA PCR Screening     Status: None   Collection Time: 09/11/17  3:08 PM  Result Value Ref Range Status   MRSA by PCR NEGATIVE NEGATIVE Final    Comment:        The GeneXpert MRSA Assay (FDA approved for NASAL specimens only), is one component of a comprehensive MRSA colonization surveillance program. It is not intended to diagnose MRSA infection nor to guide or monitor treatment for MRSA infections. Performed at [facility], [address] [place], [place] [address]     RADIOLOGY:  No results found.   Management plans discussed with the patient, family and they are in agreement.  CODE STATUS:     Code Status Orders  (From admission, onward)         Start     Ordered   09/11/17 1505  Full code  Continuous     09/11/17 1504        Code Status History    Date Active Date Inactive Code Status Order ID Comments User Context   12/25/2014 1735 12/28/2014 1612 Full Code [id number]  Thornton Park, MD Inpatient      TOTAL TIME TAKING CARE OF THIS PATIENT: 38 minutes.    Gladstone Lighter M.D on 09/13/2017 at 2:09 PM  Between 7am to 6pm - Pager - [phone]  After 6pm go to [email or web address] - Proofreader  [facility]  Office   [phone]  CC: Primary care physician; Ronnell Freshwater, NP   Note: This dictation was prepared with Dragon dictation along with smaller phrase technology. Any transcriptional errors that result from this process are unintentional.

## 2017-09-13 NOTE — Progress Notes (Signed)
Progress Note  Patient Name: Leslie [name] Date of Encounter: 09/13/2017  Primary Cardiologist: new to [facility] - consult by [facility] in sinus rhythm. She is for TEE this morning. Feels close to her baseline. Continues to note palpitations (NSR noted during these episodes).  Inpatient Medications    Scheduled Meds: . atorvastatin  20 mg Oral q1800  . buPROPion  100 mg Oral Daily  . diltiazem  30 mg Oral Q6H  . furosemide  20 mg Oral Daily  . levofloxacin  500 mg Oral q1800  . loratadine  10 mg Oral Daily  . multivitamin with minerals  1 tablet Oral Daily  . nicotine  21 mg Transdermal Daily  . pantoprazole  40 mg Oral Daily  . rivaroxaban  20 mg Oral Q supper   Continuous Infusions:  PRN Meds: acetaminophen **OR** acetaminophen, albuterol, ALPRAZolam, fluticasone, ondansetron **OR** ondansetron (ZOFRAN) IV, traMADol   Vital Signs    Vitals:   09/12/17 1929 09/13/17 0409 09/13/17 0411 09/13/17 0726  BP: 131/73 139/80  (!) 153/75  Pulse: 70 73  84  Resp: 18 18  19   Temp: 98 F (36.7 C) 97.8 F (36.6 C)  97.9 F (36.6 C)  TempSrc: Oral Oral  Oral  SpO2: 100% 98%  100%  Weight:   108.2 kg   Height:        Intake/Output Summary (Last 24 hours) at 09/13/2017 0756 Last data filed at 09/13/2017 0411 Gross per 24 hour  Intake 355.48 ml  Output 1600 ml  Net -1244.52 ml   Filed Weights   09/11/17 1505 09/12/17 1226 09/13/17 0411  Weight: 107.6 kg 108 kg 108.2 kg    Telemetry    NSR - Personally Reviewed  ECG    n/a - Personally Reviewed  Physical Exam   GEN: No acute distress.   Neck: No JVD. Cardiac: RRR, II/VI diastolic murmur at the apex, no rubs, or gallops.  Respiratory: Clear to auscultation bilaterally.  GI: Soft, nontender, non-distended.   MS: No edema; No deformity. Neuro:  Alert and oriented x 3; Nonfocal.  Psych: Normal affect.  Labs    Chemistry Recent Labs  Lab 09/11/17 1132 09/12/17 0257 09/12/17 1402  NA  135 136  --   K 3.5 2.9* 4.0  CL 98 101  --   CO2 26 29  --   GLUCOSE 132* 127*  --   BUN 18 17  --   CREATININE 1.06* 1.05*  --   CALCIUM 9.4 8.4*  --   GFRNONAA 60* >60  --   GFRAA >60 >60  --   ANIONGAP 11 6  --      Hematology Recent Labs  Lab 09/11/17 1132 09/12/17 0257  WBC 6.4 6.8  RBC 4.59 4.17  HGB 13.1 11.9*  HCT 39.5 35.7  MCV 86.1 85.4  MCH 28.5 28.5  MCHC 33.1 33.4  RDW 16.4* 16.2*  PLT 366 306    Cardiac Enzymes Recent Labs  Lab 09/11/17 1132 09/11/17 1521 09/11/17 2133 09/12/17 0257  TROPONINI <0.03 0.15* 0.60* 0.48*   No results for input(s): TROPIPOC in the last 168 hours.   BNPNo results for input(s): BNP, PROBNP in the last 168 hours.   DDimer No results for input(s): DDIMER in the last 168 hours.   Radiology    Dg Chest Port 1 View  Result Date: 09/11/2017 IMPRESSION: Diffuse prominence of the parahilar interstitial markings, favor pulmonary edema. Electronically Signed   By:  Ilona Sorrel M.D.   On: 09/11/2017 11:50    Cardiac Studies   2-D echo 09/12/2017: Study Conclusions  - Left ventricle: The cavity size was normal. Wall thickness was   normal. Systolic function was normal. The estimated ejection   fraction was in the range of 55% to 60%. Wall motion was normal;   there were no regional wall motion abnormalities. Features are   consistent with a pseudonormal left ventricular filling pattern,   with concomitant abnormal relaxation and increased filling   pressure (grade 2 diastolic dysfunction). - Mitral valve: Poorly visualized. The findings are consistent with   moderate stenosis. Mean gradient (D): 15 mm Hg. Valve area by   pressure half-time: 1.62 cm^2. Valve area by continuity equation   (using LVOT flow): 2.15 cm^2.  Impressions:  - Significantly elevated mitral valve gradient but the valve is not   well visualized. Left atrial size is normal which argues against   severe stenosis. Consider a TEE if clinically  indicated.  Patient Profile     52 y.o. female with history of morbid obesity, possible sleep apnea, essential hypertension and hyperlipidemia who presented with shortness of breath and chest pain and was found to be in atrial fibrillation with rapid ventricular response.  Assessment & Plan    1. New onset with Afib with RVR: -Remains in sinus rhythm  -Consolidate short acting diltiazem to long-acting  -She has been noted to have moderate mitral stenosis on TTE. If TEE confirms this, she will need to be placed on Coumadin rather than a DOAC as this would be valvular Afib. She will need either a heparin bridge if she is going to remain inpatient or could potentially do Lovenox bridge to Coumadin at home   2. Mitral stenosis: -Likely contributing to her Afib above -TEE this morning to further evaluate  3. Elevated troponin: -Likely supply demand ischemia in the setting of the above -Plan for outpatient ischemic evaluation with a nuclear stress testing or cardiac CTA  4. Morbid obesity: -Needs outpatient sleep study    For questions or updates, please contact [place] Please consult [email or web address] for contact info under Cardiology/STEMI.    Signed, Christell Faith, PA-C [place] Pager: [phone] 09/13/2017, 7:56 AM

## 2017-09-13 NOTE — Progress Notes (Signed)
*  PRELIMINARY RESULTS* Echocardiogram Echocardiogram Transesophageal has been performed.  Leslie [name] 09/13/2017, 10:17 AM

## 2017-09-13 NOTE — Plan of Care (Signed)
  Problem: Clinical Measurements: Goal: Ability to maintain clinical measurements within normal limits will improve Outcome: Progressing   Problem: Clinical Measurements: Goal: Cardiovascular complication will be avoided Outcome: Progressing   Problem: Activity: Goal: Risk for activity intolerance will decrease Outcome: Progressing   Problem: Health Behavior/Discharge Planning: Goal: Ability to safely manage health-related needs after discharge will improve Outcome: Progressing

## 2017-09-13 NOTE — Progress Notes (Addendum)
     Leslie [name] was admitted to the [facility] on 09/11/2017 for an acute medical condition and is being Discharged on  09/13/2017 .  She will need another 2-3 days for recovery and so advised to stay away from work until then. So please excuse  her from work for the above  Days. Should be able to return to work without any restrictions from 09/20/17.  Call Gladstone Lighter  MD, [facility] at  [phone] with questions.  Gladstone Lighter M.D on 09/13/2017,at 3:35 PM  [facility] [address] [place] [address]

## 2017-09-13 NOTE — Interval H&P Note (Signed)
History and Physical Interval Note:  09/13/2017 9:42 AM  Leslie [name]  has presented today for surgery, with the diagnosis of atrial fibrillation and mitral stenosis. The various methods of treatment have been discussed with the patient and family. After consideration of risks, benefits and other options for treatment, the patient has consented to  Procedure(s): TRANSESOPHAGEAL ECHOCARDIOGRAM (TEE) (N/A) as a surgical intervention .  The patient's history has been reviewed, patient examined, no change in status, stable for surgery.  I have reviewed the patient's chart and labs.  Questions were answered to the patient's satisfaction.     [name]

## 2017-09-13 NOTE — Plan of Care (Signed)
  Problem: Pain Managment: Goal: General experience of comfort will improve Outcome: Progressing Note:  Right shoulder & arm pain treated with tylenol.

## 2017-09-14 ENCOUNTER — Encounter: Payer: Self-pay | Admitting: Internal Medicine

## 2017-09-21 ENCOUNTER — Ambulatory Visit (INDEPENDENT_AMBULATORY_CARE_PROVIDER_SITE_OTHER): Payer: BLUE CROSS/BLUE SHIELD

## 2017-09-21 DIAGNOSIS — I050 Rheumatic mitral stenosis: Secondary | ICD-10-CM | POA: Diagnosis not present

## 2017-09-21 DIAGNOSIS — Z5181 Encounter for therapeutic drug level monitoring: Secondary | ICD-10-CM | POA: Diagnosis not present

## 2017-09-21 DIAGNOSIS — I4891 Unspecified atrial fibrillation: Secondary | ICD-10-CM

## 2017-09-21 LAB — POCT INR: INR: 3.2 — AB (ref 2.0–3.0)

## 2017-09-21 NOTE — Patient Instructions (Signed)
STOP LOVENOX! Please take your coumadin (warfarin) in the evenings. Please skip coumadin tonight, then resume previous dosage of 1 tablet daily.  Recheck INR in 1 week

## 2017-09-22 ENCOUNTER — Ambulatory Visit (INDEPENDENT_AMBULATORY_CARE_PROVIDER_SITE_OTHER): Payer: BLUE CROSS/BLUE SHIELD | Admitting: Nurse Practitioner

## 2017-09-22 ENCOUNTER — Telehealth: Payer: Self-pay

## 2017-09-22 ENCOUNTER — Encounter: Payer: Self-pay | Admitting: Nurse Practitioner

## 2017-09-22 VITALS — BP 134/58 | HR 98 | Resp 16 | Ht 64.0 in | Wt 234.0 lb

## 2017-09-22 DIAGNOSIS — F329 Major depressive disorder, single episode, unspecified: Secondary | ICD-10-CM

## 2017-09-22 DIAGNOSIS — I4891 Unspecified atrial fibrillation: Secondary | ICD-10-CM | POA: Diagnosis not present

## 2017-09-22 DIAGNOSIS — I050 Rheumatic mitral stenosis: Secondary | ICD-10-CM | POA: Diagnosis not present

## 2017-09-22 DIAGNOSIS — F17200 Nicotine dependence, unspecified, uncomplicated: Secondary | ICD-10-CM | POA: Diagnosis not present

## 2017-09-22 MED ORDER — BUPROPION HCL ER (SR) 100 MG PO TB12: 100.0000 mg | ORAL_TABLET | Freq: Two times a day (BID) | ORAL | 3 refills | Status: DC

## 2017-09-22 NOTE — Telephone Encounter (Signed)
Pt advised stopped bupropion as per [employer]

## 2017-09-22 NOTE — Progress Notes (Signed)
[facility] [place], [place] [address]  Internal MEDICINE  Office Visit Note  Patient Name: Leslie [name]  [id number]  [id number]  Date of Service: 09/22/2017    Pt is here for recent hospital follow up.   Chief Complaint  Patient presents with  . Atrial Fibrillation    hospital follow up. pt is still feeling fatigued and still feels the palpitation, but feelinig a little better since last visit.      The patient was recently hospitalized for a-fib. Last Sunday, was getting ready for church and felt her heart start to race and she became short of breath. Her husband took her to the hospital and she was admitted for new onset a-fib. She underwent extensive testing. Found to have mitral valve stenosis. She is now on cardizem 1100m daily and is taking coumadin to prevent clots. She had INR checked yesterday, and it was 3.2. She has follow up appointment with cardiology is next Monday. She is In the process of smoking cessation. She is wearing a nicotine patch and is taking wellbutrin ER 1070mdaily.  She has FMLA paperwork. She was initially given off through 10/10/2017, however she has an additional appointment with cardiology on the same day. Unsure she will be able to return to work on that day.     Current Medication: Outpatient Encounter Medications as of 09/22/2017  Medication Sig  . albuterol (PROVENTIL HFA;VENTOLIN HFA) 108 (90 BASE) MCG/ACT inhaler Inhale 2 puffs into the lungs every 6 (six) hours as needed for wheezing or shortness of breath.  . ALPRAZolam (XANAX) 0.5 MG tablet Take 0.5 mg by mouth 2 (two) times daily as needed for anxiety. Take 1 tab po by twice a day as needed  For anxiety  . aspirin EC 81 MG tablet Take 81 mg by mouth daily.  . Marland Kitchentorvastatin (LIPITOR) 20 MG tablet Take 1 tablet (20 mg total) by mouth daily at 6 PM.  . cetirizine (ZYRTEC) 10 MG tablet Take 1 tablet (10 mg total) by mouth daily.  . chlorpheniramine-HYDROcodone (TUSSIONEX  PENNKINETIC ER) 10-8 MG/5ML SUER Take 5 mLs by mouth every 12 (twelve) hours as needed for cough.  . diltiazem (CARDIZEM CD) 180 MG 24 hr capsule Take 1 capsule (180 mg total) by mouth daily.  . Marland Kitchenstradiol (ESTRACE) 0.5 MG tablet Take 1 tablet (0.5 mg total) by mouth daily.  . fluticasone (FLONASE) 50 MCG/ACT nasal spray Place 2 sprays into both nostrils daily as needed for allergies or rhinitis.  . furosemide (LASIX) 20 MG tablet Take 1 tablet (20 mg total) by mouth daily.  . montelukast (SINGULAIR) 10 MG tablet Take 1 tablet (10 mg total) by mouth at bedtime.  . Multiple Vitamin (MULTIVITAMIN) capsule Take 1 capsule by mouth daily.  . nicotine (NICODERM CQ - DOSED IN MG/24 HOURS) 21 mg/24hr patch Place 1 patch (21 mg total) onto the skin daily.  . Marland Kitchenystatin cream (MYCOSTATIN) Apply 1 application topically 3 (three) times daily.  . Marland Kitchenmeprazole (PRILOSEC) 40 MG capsule Take 1 capsule (40 mg total) by mouth daily.  . traMADol (ULTRAM) 50 MG tablet Take 1 tablet (50 mg total) by mouth every 6 (six) hours as needed for moderate pain.  . Marland Kitchenarfarin (COUMADIN) 5 MG tablet Take 1 tablet (5 mg total) by mouth daily.  . [DISCONTINUED] enoxaparin (LOVENOX) 120 MG/0.8ML injection Inject 0.72 mLs (110 mg total) into the skin every 12 (twelve) hours for 5 days.  . [DISCONTINUED] levofloxacin (LEVAQUIN) 500 MG tablet Take  1 tablet (500 mg total) by mouth daily. (Patient not taking: Reported on 09/21/2017)   No facility-administered encounter medications on file as of 09/22/2017.     Surgical History: Past Surgical History:  Procedure Laterality Date  . ABDOMINAL HYSTERECTOMY     total  . ABDOMINAL HYSTERECTOMY    . BREAST BIOPSY Bilateral 2012  . BREAST BIOPSY Right 08-07-12   fibroadenomatous changes and columnar cells  . BREAST BIOPSY  02/03/2015   stereo byrnett  . CHOLECYSTECTOMY N/A 02/27/2016   Procedure: LAPAROSCOPIC CHOLECYSTECTOMY;  Surgeon: Christene Lye, MD;  Location: [facility] ORS;  Service:  General;  Laterality: N/A;  . JOINT REPLACEMENT Left   . KNEE CLOSED REDUCTION Left 04/15/2015   Procedure: CLOSED MANIPULATION KNEE;  Surgeon: Thornton Park, MD;  Location: [facility] ORS;  Service: Orthopedics;  Laterality: Left;  . KNEE SURGERY    . TEE WITHOUT CARDIOVERSION N/A 09/13/2017   Procedure: TRANSESOPHAGEAL ECHOCARDIOGRAM (TEE);  Surgeon: Nelva Bush, MD;  Location: [facility] ORS;  Service: Cardiovascular;  Laterality: N/A;  . TOTAL KNEE ARTHROPLASTY Left 12/25/2014   Procedure: TOTAL KNEE ARTHROPLASTY;  Surgeon: Thornton Park, MD;  Location: [facility] ORS;  Service: Orthopedics;  Laterality: Left;  . TUBAL LIGATION      Medical History: Past Medical History:  Diagnosis Date  . Allergy   . Anemia    h/o  . Anxiety   . BRCA negative 03/22/2013  . Bronchitis 02/19/2016   ON LEVAQUIN PO  . Cigarette smoker 09/11/2017   8-10 day  . GERD (gastroesophageal reflux disease)   . History of kidney stones   . Hyperlipidemia   . Hypertension     Family History: Family History  Problem Relation Age of Onset  . Cancer Mother 68       breast  . Cancer Maternal Aunt        breast  . Cancer Maternal Grandmother        breast    Social History   Socioeconomic History  . Marital status: Married    Spouse name: Not on file  . Number of children: Not on file  . Years of education: Not on file  . Highest education level: Not on file  Occupational History  . Not on file  Social Needs  . Financial resource strain: Not on file  . Food insecurity:    Worry: Not on file    Inability: Not on file  . Transportation needs:    Medical: Not on file    Non-medical: Not on file  Tobacco Use  . Smoking status: Current Every Day Smoker    Packs/day: 0.50    Years: 20.00    Pack years: 10.00    Types: Cigarettes  . Smokeless tobacco: Never Used  . Tobacco comment: down to 2-3 daily  Substance and Sexual Activity  . Alcohol use: Not Currently    Alcohol/week: 2.0 standard drinks     Types: 2 Glasses of wine per week    Comment: wine occ  . Drug use: No  . Sexual activity: Not on file  Lifestyle  . Physical activity:    Days per week: Not on file    Minutes per session: Not on file  . Stress: Not on file  Relationships  . Social connections:    Talks on phone: Not on file    Gets together: Not on file    Attends religious service: Not on file    Active member of club or organization: Not on  file    Attends meetings of clubs or organizations: Not on file    Relationship status: Not on file  . Intimate partner violence:    Fear of current or ex partner: Not on file    Emotionally abused: Not on file    Physically abused: Not on file    Forced sexual activity: Not on file  Other Topics Concern  . Not on file  Social History Narrative  . Not on file      Review of Systems  Constitutional: Positive for activity change and fatigue. Negative for chills and unexpected weight change.  HENT: Negative for congestion, postnasal drip, rhinorrhea, sneezing and sore throat.   Eyes: Negative.  Negative for redness.  Respiratory: Positive for shortness of breath. Negative for cough and chest tightness.   Cardiovascular: Positive for palpitations and leg swelling. Negative for chest pain.       Improving  Gastrointestinal: Negative for abdominal pain, constipation, diarrhea, nausea and vomiting.  Endocrine: Negative for cold intolerance, heat intolerance, polydipsia, polyphagia and polyuria.  Genitourinary: Negative.  Negative for dysuria and frequency.  Musculoskeletal: Negative for arthralgias, back pain, joint swelling and neck pain.  Skin: Negative for rash.  Allergic/Immunologic: Positive for environmental allergies.  Neurological: Positive for dizziness. Negative for tremors, numbness and headaches.  Hematological: Negative for adenopathy. Does not bruise/bleed easily.  Psychiatric/Behavioral: Negative for behavioral problems (Depression), sleep disturbance and  suicidal ideas. The patient is nervous/anxious.     Vital Signs: BP (!) 134/58 (BP Location: Left Arm, Patient Position: Sitting, Cuff Size: Large)   Pulse 98   Resp 16   Ht '5\' 4"'  (1.626 m)   Wt 234 lb (106.1 kg)   SpO2 96%   BMI 40.17 kg/m    Physical Exam  Constitutional: She is oriented to person, place, and time. She appears well-developed and well-nourished. No distress.  HENT:  Head: Normocephalic and atraumatic.  Nose: Nose normal.  Mouth/Throat: Oropharynx is clear and moist. No oropharyngeal exudate.  Eyes: Pupils are equal, round, and reactive to light. Conjunctivae and EOM are normal.  Neck: Normal range of motion. Neck supple. No JVD present. No tracheal deviation present. No thyromegaly present.  Cardiovascular: Normal rate, regular rhythm and normal heart sounds. Exam reveals no gallop and no friction rub.  No murmur heard. Very soft, systolic murmur auscultated.  Pulmonary/Chest: Effort normal and breath sounds normal. No respiratory distress. She has no wheezes. She has no rales. She exhibits no tenderness.  Abdominal: Soft. Bowel sounds are normal. There is no tenderness.  Musculoskeletal: Normal range of motion.  Lymphadenopathy:    She has no cervical adenopathy.  Neurological: She is alert and oriented to person, place, and time. No cranial nerve deficit.  Skin: Skin is warm and dry. Capillary refill takes less than 2 seconds. She is not diaphoretic.  Psychiatric: Her speech is normal and behavior is normal. Judgment and thought content normal. Her mood appears anxious. Cognition and memory are normal.  Nursing note and vitals reviewed.  Assessment/Plan: 1. Atrial fibrillation, unspecified type ([place]) New onset. Reviewed records, labs, and images from recent hospitalization. Updated her medication list to reflect changes made in the hospital. Will fill out FMLA paperwork to return to work 10/17/2017. Will update forms as indicated. She should follow up with  cardiology as scheduled.   2. Mitral valve stenosis, unspecified etiology Believed to be cause of new onset a-fib. Should follow up with cardiology as scheduled.   3. Tobacco dependency buproion d/c in  hospital. Advise her to continue off this medication. Continue to use nicotine patches as prescribed.   General Counseling: Nilani verbalizes understanding of the findings of todays visit and agrees with plan of treatment. I have discussed any further diagnostic evaluation that may be needed or ordered today. We also reviewed her medications today. she has been encouraged to call the office with any questions or concerns that should arise related to todays visit.    Counseling:  This patient was seen by Leretha Pol FNP Collaboration with Dr Lavera Guise as a part of collaborative care agreement   I have reviewed all medical records from hospital follow up including radiology reports and consults from other physicians. Appropriate follow up diagnostics will be scheduled as needed. Patient/ Family understands the plan of treatment. Time spent 30 minutes.   Dr Lavera Guise, MD Internal Medicine

## 2017-09-23 ENCOUNTER — Other Ambulatory Visit: Payer: Self-pay

## 2017-09-23 DIAGNOSIS — E559 Vitamin D deficiency, unspecified: Secondary | ICD-10-CM | POA: Diagnosis not present

## 2017-09-23 DIAGNOSIS — E039 Hypothyroidism, unspecified: Secondary | ICD-10-CM | POA: Diagnosis not present

## 2017-09-23 DIAGNOSIS — Z0001 Encounter for general adult medical examination with abnormal findings: Secondary | ICD-10-CM | POA: Diagnosis not present

## 2017-09-23 DIAGNOSIS — M545 Low back pain: Secondary | ICD-10-CM | POA: Diagnosis not present

## 2017-09-28 ENCOUNTER — Other Ambulatory Visit
Admission: RE | Admit: 2017-09-28 | Discharge: 2017-09-28 | Disposition: A | Discharge status: Home / Self Care | Payer: BLUE CROSS/BLUE SHIELD | Source: Ambulatory Visit | Attending: Internal Medicine | Admitting: Internal Medicine

## 2017-09-28 ENCOUNTER — Ambulatory Visit (INDEPENDENT_AMBULATORY_CARE_PROVIDER_SITE_OTHER): Payer: BLUE CROSS/BLUE SHIELD

## 2017-09-28 DIAGNOSIS — I4891 Unspecified atrial fibrillation: Secondary | ICD-10-CM

## 2017-09-28 DIAGNOSIS — I050 Rheumatic mitral stenosis: Secondary | ICD-10-CM

## 2017-09-28 DIAGNOSIS — Z5181 Encounter for therapeutic drug level monitoring: Secondary | ICD-10-CM

## 2017-09-28 LAB — POCT INR: INR: 8 — AB (ref 2.0–3.0)

## 2017-09-28 LAB — PROTIME-INR
INR: 5.5
Prothrombin Time: 49.6 seconds — ABNORMAL HIGH (ref 11.4–15.2)

## 2017-09-28 NOTE — Patient Instructions (Addendum)
Please skip coumadin today and tomorrow, then START NEW DOSAGE of 1/2 tablet every day. Recheck in 2 weeks.

## 2017-09-29 ENCOUNTER — Ambulatory Visit: Payer: BLUE CROSS/BLUE SHIELD | Admitting: Cardiovascular Disease

## 2017-09-29 ENCOUNTER — Telehealth: Payer: Self-pay

## 2017-09-29 LAB — URINALYSIS, ROUTINE W REFLEX MICROSCOPIC
Bilirubin, UA: NEGATIVE
Glucose, UA: NEGATIVE
Ketones, UA: NEGATIVE
Leukocytes, UA: NEGATIVE
Nitrite, UA: NEGATIVE
Protein, UA: NEGATIVE
Specific Gravity, UA: 1.019 (ref 1.005–1.030)
Urobilinogen, Ur: 0.2 mg/dL (ref 0.2–1.0)
pH, UA: 6.5 (ref 5.0–7.5)

## 2017-09-29 LAB — VITAMIN D 1,25 DIHYDROXY
Vitamin D 1, 25 (OH)2 Total: 21 pg/mL
Vitamin D2 1, 25 (OH)2: <10 pg/mL
Vitamin D3 1, 25 (OH)2: 21 pg/mL

## 2017-09-29 LAB — CBC WITH DIFFERENTIAL/PLATELET
Basophils Absolute: 0 10*3/uL (ref 0.0–0.2)
Basos: 1 %
EOS (ABSOLUTE): 0.1 10*3/uL (ref 0.0–0.4)
Eos: 1 %
Hematocrit: 37.2 % (ref 34.0–46.6)
Hemoglobin: 12.4 g/dL (ref 11.1–15.9)
Immature Grans (Abs): 0 10*3/uL (ref 0.0–0.1)
Immature Granulocytes: 0 %
Lymphocytes Absolute: 2.5 10*3/uL (ref 0.7–3.1)
Lymphs: 39 %
MCH: 27.7 pg (ref 26.6–33.0)
MCHC: 33.3 g/dL (ref 31.5–35.7)
MCV: 83 fL (ref 79–97)
Monocytes Absolute: 0.7 10*3/uL (ref 0.1–0.9)
Monocytes: 10 %
Neutrophils Absolute: 3.2 10*3/uL (ref 1.4–7.0)
Neutrophils: 49 %
Platelets: 401 10*3/uL (ref 150–450)
RBC: 4.47 x10E6/uL (ref 3.77–5.28)
RDW: 14.8 % (ref 12.3–15.4)
WBC: 6.4 10*3/uL (ref 3.4–10.8)

## 2017-09-29 LAB — MICROSCOPIC EXAMINATION
Bacteria, UA: NONE SEEN
Casts: NONE SEEN /lpf

## 2017-09-29 LAB — COMPREHENSIVE METABOLIC PANEL
ALT: 36 IU/L — ABNORMAL HIGH (ref 0–32)
AST: 24 IU/L (ref 0–40)
Albumin/Globulin Ratio: 1.4 (ref 1.2–2.2)
Albumin: 4.3 g/dL (ref 3.5–5.5)
Alkaline Phosphatase: 137 IU/L — ABNORMAL HIGH (ref 39–117)
BUN/Creatinine Ratio: 19 (ref 9–23)
BUN: 19 mg/dL (ref 6–24)
Bilirubin Total: <0.2 mg/dL (ref 0.0–1.2)
CO2: 25 mmol/L (ref 20–29)
Calcium: 10 mg/dL (ref 8.7–10.2)
Chloride: 94 mmol/L — ABNORMAL LOW (ref 96–106)
Creatinine, Ser: 1.02 mg/dL — ABNORMAL HIGH (ref 0.57–1.00)
GFR calc Af Amer: 74 mL/min/{1.73_m2} (ref >59)
GFR calc non Af Amer: 64 mL/min/{1.73_m2} (ref >59)
Globulin, Total: 3 g/dL (ref 1.5–4.5)
Glucose: 110 mg/dL — ABNORMAL HIGH (ref 65–99)
Potassium: 3.5 mmol/L (ref 3.5–5.2)
Sodium: 137 mmol/L (ref 134–144)
Total Protein: 7.3 g/dL (ref 6.0–8.5)

## 2017-09-29 LAB — TSH: TSH: 0.973 u[IU]/mL (ref 0.450–4.500)

## 2017-09-29 LAB — T4, FREE: Free T4: 1.37 ng/dL (ref 0.82–1.77)

## 2017-09-29 LAB — LIPID PANEL
Chol/HDL Ratio: 4.6 ratio — ABNORMAL HIGH (ref 0.0–4.4)
Cholesterol, Total: 175 mg/dL (ref 100–199)
HDL: 38 mg/dL — ABNORMAL LOW (ref >39)
LDL Calculated: 110 mg/dL — ABNORMAL HIGH (ref 0–99)
Triglycerides: 136 mg/dL (ref 0–149)
VLDL Cholesterol Cal: 27 mg/dL (ref 5–40)

## 2017-09-29 NOTE — Telephone Encounter (Signed)
PATIENT CALLED 09-29-17 IN REGARDS TO FMLA PAPERWORK. IS CURRENTLY ON HEATHERS DESK AND SHE IS WORKING ON IT.

## 2017-09-30 ENCOUNTER — Telehealth: Payer: Self-pay

## 2017-09-30 NOTE — Telephone Encounter (Signed)
CALLED PATIENT 09-30-17 AND INFORMED THEM THAT THEIR FMLA PAPERWORK WAS READY FOR PICKUP.

## 2017-10-02 NOTE — Progress Notes (Signed)
Cardiology Office Note  Date:  10/04/2017   ID:  Leslie [name], DOB [birth year], MRN [id number]  PCP:  Ronnell Freshwater, NP   Chief Complaint  Patient presents with  . other    Follow up from [facility]; A-Fib/ AVR. Meds reviewed by the pt. verbally. Pt. fatigue, irreg. rapid heart beats, chest pain, LE edema at times and shortness of breath.     HPI:  Leslie [name] is a 52 y.o. female with a hx of  morbid obesity,  sleep apnea,  anxiety,  hyperlipidemia,  Hospital admission 09/11/2017 for  shortness of breath chest tightness in atrial fibrillation with RVR Had TEE cardioversion Who presents for follow up of her atrial fibrillation  palpitations for the past several months Getting ready for church,   acutely developed chest tightness and tachycardia. Presented to the emergency room   In the emergency room EKG documenting Atrial fibrillation with RVR Difficulty controlling her rates on diltiazem and digoxin Being treated at the time for bronchitis, Levaquin  TEE on 09/13/2017 Normal EF Moderate MS Mean gradient 14 mm Hg  Started on warfarin, moderate mitral stenosis  As an outpatient since the hospital discharge reports having some palpitations Shortness of breath and tachycardia when she walks  EKG personally reviewed by myself on todays visit Shows normal sinus rhythm rate 88 bpm no significant ST or T-wave changes   PMH:   has a past medical history of Allergy, Anemia, Anxiety, BRCA negative (03/22/2013), Bronchitis (02/19/2016), Cigarette smoker (09/11/2017), GERD (gastroesophageal reflux disease), History of kidney stones, Hyperlipidemia, and Hypertension.  PSH:    Past Surgical History:  Procedure Laterality Date  . ABDOMINAL HYSTERECTOMY     total  . ABDOMINAL HYSTERECTOMY    . BREAST BIOPSY Bilateral 2012  . BREAST BIOPSY Right 08-07-12   fibroadenomatous changes and columnar cells  . BREAST BIOPSY  02/03/2015   stereo byrnett  . CHOLECYSTECTOMY N/A 02/27/2016    Procedure: LAPAROSCOPIC CHOLECYSTECTOMY;  Surgeon: Christene Lye, MD;  Location: [facility] ORS;  Service: General;  Laterality: N/A;  . JOINT REPLACEMENT Left   . KNEE CLOSED REDUCTION Left 04/15/2015   Procedure: CLOSED MANIPULATION KNEE;  Surgeon: Thornton Park, MD;  Location: [facility] ORS;  Service: Orthopedics;  Laterality: Left;  . KNEE SURGERY    . TEE WITHOUT CARDIOVERSION N/A 09/13/2017   Procedure: TRANSESOPHAGEAL ECHOCARDIOGRAM (TEE);  Surgeon: Nelva Bush, MD;  Location: [facility] ORS;  Service: Cardiovascular;  Laterality: N/A;  . TOTAL KNEE ARTHROPLASTY Left 12/25/2014   Procedure: TOTAL KNEE ARTHROPLASTY;  Surgeon: Thornton Park, MD;  Location: [facility] ORS;  Service: Orthopedics;  Laterality: Left;  . TUBAL LIGATION      Current Outpatient Medications  Medication Sig Dispense Refill  . albuterol (PROVENTIL HFA;VENTOLIN HFA) 108 (90 BASE) MCG/ACT inhaler Inhale 2 puffs into the lungs every 6 (six) hours as needed for wheezing or shortness of breath.    . ALPRAZolam (XANAX) 0.5 MG tablet Take 0.5 mg by mouth 2 (two) times daily as needed for anxiety. Take 1 tab po by twice a day as needed  For anxiety    . aspirin EC 81 MG tablet Take 81 mg by mouth daily.    Marland Kitchen atorvastatin (LIPITOR) 20 MG tablet Take 1 tablet (20 mg total) by mouth daily at 6 PM. 90 tablet 2  . cetirizine (ZYRTEC) 10 MG tablet Take 1 tablet (10 mg total) by mouth daily. 90 tablet 3  . diltiazem (CARDIZEM CD) 180 MG 24 hr capsule Take 1  capsule (180 mg total) by mouth daily. 30 capsule 2  . estradiol (ESTRACE) 0.5 MG tablet Take 1 tablet (0.5 mg total) by mouth daily. 90 tablet 1  . fluticasone (FLONASE) 50 MCG/ACT nasal spray Place 2 sprays into both nostrils daily as needed for allergies or rhinitis.    . furosemide (LASIX) 20 MG tablet Take 1 tablet (20 mg total) by mouth daily. 30 tablet 2  . montelukast (SINGULAIR) 10 MG tablet Take 1 tablet (10 mg total) by mouth at bedtime. 90 tablet 2  . Multiple Vitamin  (MULTIVITAMIN) capsule Take 1 capsule by mouth daily.    . nicotine (NICODERM CQ - DOSED IN MG/24 HOURS) 21 mg/24hr patch Place 1 patch (21 mg total) onto the skin daily. 28 patch 0  . nystatin cream (MYCOSTATIN) Apply 1 application topically 3 (three) times daily. 30 g 1  . omeprazole (PRILOSEC) 40 MG capsule Take 1 capsule (40 mg total) by mouth daily. 90 capsule 2  . traMADol (ULTRAM) 50 MG tablet Take 1 tablet (50 mg total) by mouth every 6 (six) hours as needed for moderate pain. 20 tablet 0  . warfarin (COUMADIN) 5 MG tablet Take 1 tablet (5 mg total) by mouth daily. (Patient taking differently: Take 2.5 mg by mouth daily. ) 30 tablet 0   No current facility-administered medications for this visit.      Allergies:   Morphine; Oxycodone hcl; Dilaudid [hydromorphone hcl]; Morphine and related; and Oxycodone   Social History:  The patient  reports that she has been smoking cigarettes. She has a 10.00 pack-year smoking history. She has never used smokeless tobacco. She reports that she drank about 2.0 standard drinks of alcohol per week. She reports that she does not use drugs.   Family History:   family history includes Cancer in her maternal aunt and maternal grandmother; Cancer (age of onset: 43) in her mother.    Review of Systems: Review of Systems  Constitutional: Negative.   Respiratory: Positive for shortness of breath.   Cardiovascular: Positive for palpitations.  Gastrointestinal: Negative.   Musculoskeletal: Negative.   Neurological: Negative.   Psychiatric/Behavioral: Negative.   All other systems reviewed and are negative.    PHYSICAL EXAM: VS:  BP 140/80 (BP Location: Left Arm, Patient Position: Sitting, Cuff Size: Normal)   Pulse 88   Ht '5\' 4"'  (1.626 m)   Wt 234 lb 8 oz (106.4 kg)   BMI 40.25 kg/m  , BMI Body mass index is 40.25 kg/m. GEN: Well nourished, well developed, in no acute distress  HEENT: normal  Neck: no JVD, carotid bruits, or masses Cardiac:  RRR; 1/6 SEM LSB,   rubs, or gallops,no edema  Respiratory:  clear to auscultation bilaterally, normal work of breathing GI: soft, nontender, nondistended, + BS MS: no deformity or atrophy  Skin: warm and dry, no rash Neuro:  Strength and sensation are intact Psych: euthymic mood, full affect  Recent Labs: 09/13/2017: Magnesium 2.2 09/23/2017: ALT 36; BUN 19; Creatinine, Ser 1.02; Hemoglobin 12.4; Platelets 401; Potassium 3.5; Sodium 137; TSH 0.973    Lipid Panel Lab Results  Component Value Date   CHOL 175 09/23/2017   HDL 38 (L) 09/23/2017   LDLCALC 110 (H) 09/23/2017   TRIG 136 09/23/2017      Wt Readings from Last 3 Encounters:  10/04/17 234 lb 8 oz (106.4 kg)  09/22/17 234 lb (106.1 kg)  09/13/17 238 lb 8.6 oz (108.2 kg)       ASSESSMENT AND PLAN:  Persistent atrial fibrillation (HCC) - Plan: EKG 12-Lead Maintaining normal sinus rhythm Recommended she add metoprolol succinate 50 mg daily for her palpitations  Mitral valve stenosis, unspecified etiology - Plan: EKG 12-Lead We will add metoprolol as above Periodic echocardiogram, next in one year  Essential hypertension Toprol added as above  OSA (obstructive sleep apnea) managed by primary care  Disposition:   F/U  12 months   Total encounter time more than 25 minutes  Greater than 50% was spent in counseling and coordination of care with the patient    Orders Placed This Encounter  Procedures  . EKG 12-Lead     Signed, Esmond Plants, M.D., Ph.D. 10/04/2017  [place], [place]

## 2017-10-04 ENCOUNTER — Ambulatory Visit (INDEPENDENT_AMBULATORY_CARE_PROVIDER_SITE_OTHER): Payer: BLUE CROSS/BLUE SHIELD | Admitting: Cardiovascular Disease

## 2017-10-04 ENCOUNTER — Encounter: Payer: Self-pay | Admitting: Cardiovascular Disease

## 2017-10-04 VITALS — BP 140/80 | HR 88 | Ht 64.0 in | Wt 234.5 lb

## 2017-10-04 DIAGNOSIS — I481 Persistent atrial fibrillation: Secondary | ICD-10-CM | POA: Diagnosis not present

## 2017-10-04 DIAGNOSIS — I4819 Other persistent atrial fibrillation: Secondary | ICD-10-CM

## 2017-10-04 DIAGNOSIS — G4733 Obstructive sleep apnea (adult) (pediatric): Secondary | ICD-10-CM | POA: Diagnosis not present

## 2017-10-04 DIAGNOSIS — I10 Essential (primary) hypertension: Secondary | ICD-10-CM | POA: Diagnosis not present

## 2017-10-04 DIAGNOSIS — I050 Rheumatic mitral stenosis: Secondary | ICD-10-CM

## 2017-10-04 MED ORDER — POTASSIUM CHLORIDE ER 10 MEQ PO TBCR: 10.0000 meq | EXTENDED_RELEASE_TABLET | Freq: Every day | ORAL | 3 refills | Status: DC

## 2017-10-04 MED ORDER — METOPROLOL SUCCINATE ER 50 MG PO TB24: 50.0000 mg | ORAL_TABLET | Freq: Every day | ORAL | 11 refills | Status: DC

## 2017-10-04 NOTE — Patient Instructions (Addendum)
Medication Instructions:   Please start metoprolol one a day with breakfast Slows down the heart rate   Start one potassium daily  Labwork:  No new labs needed  Testing/Procedures:  No further testing at this time   Follow-Up: It was a pleasure seeing you in the office today. Please call us if you have new issues that need to be addressed before your next appt.  [phone]  Your physician wants you to follow-up in: 12 months.  You will receive a reminder letter in the mail two months in advance. If you don't receive a letter, please call our office to schedule the follow-up appointment.  If you need a refill on your cardiac medications before your next appointment, please call your pharmacy.  For educational health videos Log in to : [email or web address] Or : [email or web address], password : [email or web address]

## 2017-10-07 ENCOUNTER — Telehealth: Payer: Self-pay | Admitting: Internal Medicine

## 2017-10-07 NOTE — Telephone Encounter (Signed)
Received fax from [employer] for disability paperwork to be completed Placed in [employer] and sent to [employer]

## 2017-10-10 ENCOUNTER — Ambulatory Visit: Payer: BLUE CROSS/BLUE SHIELD | Admitting: Cardiovascular Disease

## 2017-10-10 NOTE — Telephone Encounter (Signed)
Pt is calling to ask if her return to work date can be 9/30. Please add to [facility]

## 2017-10-12 ENCOUNTER — Ambulatory Visit (INDEPENDENT_AMBULATORY_CARE_PROVIDER_SITE_OTHER): Payer: BLUE CROSS/BLUE SHIELD

## 2017-10-12 ENCOUNTER — Telehealth: Payer: Self-pay | Admitting: Internal Medicine

## 2017-10-12 DIAGNOSIS — Z5181 Encounter for therapeutic drug level monitoring: Secondary | ICD-10-CM

## 2017-10-12 DIAGNOSIS — I050 Rheumatic mitral stenosis: Secondary | ICD-10-CM

## 2017-10-12 DIAGNOSIS — I4891 Unspecified atrial fibrillation: Secondary | ICD-10-CM | POA: Diagnosis not present

## 2017-10-12 LAB — POCT INR: INR: 6.6 — AB (ref 2.0–3.0)

## 2017-10-12 MED ORDER — WARFARIN SODIUM 2.5 MG PO TABS: 2.5000 mg | ORAL_TABLET | Freq: Every day | ORAL | 0 refills | Status: DC

## 2017-10-12 NOTE — Telephone Encounter (Signed)
Recieved request from : [employer] Scanned to roi   Forwarded to [employer] for processing

## 2017-10-12 NOTE — Patient Instructions (Signed)
Please skip coumadin until Sunday. Then START NEW DOSAGE of tablet (2.5 mg) every day EXCEPT 1/2 ON MONDAYS, [place]. Recheck in 2 weeks.

## 2017-10-13 ENCOUNTER — Encounter: Payer: Self-pay | Admitting: Nurse Practitioner

## 2017-10-13 ENCOUNTER — Ambulatory Visit (INDEPENDENT_AMBULATORY_CARE_PROVIDER_SITE_OTHER): Payer: BLUE CROSS/BLUE SHIELD | Admitting: Nurse Practitioner

## 2017-10-13 VITALS — BP 135/73 | HR 68 | Resp 16 | Ht 64.0 in | Wt 235.8 lb

## 2017-10-13 DIAGNOSIS — F17200 Nicotine dependence, unspecified, uncomplicated: Secondary | ICD-10-CM | POA: Diagnosis not present

## 2017-10-13 DIAGNOSIS — I050 Rheumatic mitral stenosis: Secondary | ICD-10-CM | POA: Diagnosis not present

## 2017-10-13 DIAGNOSIS — I4891 Unspecified atrial fibrillation: Secondary | ICD-10-CM | POA: Diagnosis not present

## 2017-10-13 NOTE — Progress Notes (Signed)
[facility] [place], [place] [address]  Internal MEDICINE  Office Visit Note  Patient Name: Leslie [name]  [id number]  [id number]  Date of Service: 10/22/2017  Chief Complaint  Patient presents with  . Atrial Fibrillation    follow up    The patient was recently hospitalized for a-fib. A few weeks ago, was getting ready for church and felt her heart start to race and she became short of breath. Her husband took her to the hospital and she was admitted for new onset a-fib. She underwent extensive testing. Found to have mitral valve stenosis. She is now on cardizem 162m daily and is taking coumadin to prevent clots. She had INR checked yesterday and it was elevated. Her dosing was lowered and she will have her INR rechecked next week.  She has followed up with cardiology. Happy with her progress. She is In the process of smoking cessation. She is wearing a nicotine patch and is no longer taking wellbutrin.  She continues to do well and should be ready to go back to work on Monday.       Current Medication: Outpatient Encounter Medications as of 10/13/2017  Medication Sig  . albuterol (PROVENTIL HFA;VENTOLIN HFA) 108 (90 BASE) MCG/ACT inhaler Inhale 2 puffs into the lungs every 6 (six) hours as needed for wheezing or shortness of breath.  . ALPRAZolam (XANAX) 0.5 MG tablet Take 0.5 mg by mouth 2 (two) times daily as needed for anxiety. Take 1 tab po by twice a day as needed  For anxiety  . atorvastatin (LIPITOR) 20 MG tablet Take 1 tablet (20 mg total) by mouth daily at 6 PM.  . cetirizine (ZYRTEC) 10 MG tablet Take 1 tablet (10 mg total) by mouth daily.  .Marland Kitchendiltiazem (CARDIZEM CD) 180 MG 24 hr capsule Take 1 capsule (180 mg total) by mouth daily.  .Marland Kitchenestradiol (ESTRACE) 0.5 MG tablet Take 1 tablet (0.5 mg total) by mouth daily.  . fluticasone (FLONASE) 50 MCG/ACT nasal spray Place 2 sprays into both nostrils daily as needed for allergies or rhinitis.  .  furosemide (LASIX) 20 MG tablet Take 1 tablet (20 mg total) by mouth daily.  . metoprolol succinate (TOPROL-XL) 50 MG 24 hr tablet Take 1 tablet (50 mg total) by mouth daily. Take with or immediately following a meal.  . montelukast (SINGULAIR) 10 MG tablet Take 1 tablet (10 mg total) by mouth at bedtime.  . Multiple Vitamin (MULTIVITAMIN) capsule Take 1 capsule by mouth daily.  . nicotine (NICODERM CQ - DOSED IN MG/24 HOURS) 21 mg/24hr patch Place 1 patch (21 mg total) onto the skin daily.  .Marland Kitchennystatin cream (MYCOSTATIN) Apply 1 application topically 3 (three) times daily.  .Marland Kitchenomeprazole (PRILOSEC) 40 MG capsule Take 1 capsule (40 mg total) by mouth daily.  . potassium chloride (K-DUR) 10 MEQ tablet Take 1 tablet (10 mEq total) by mouth daily.  . traMADol (ULTRAM) 50 MG tablet Take 1 tablet (50 mg total) by mouth every 6 (six) hours as needed for moderate pain.  .Marland Kitchenwarfarin (COUMADIN) 2.5 MG tablet Take 1 tablet (2.5 mg total) by mouth daily. As directed by the coumadin clinic.   No facility-administered encounter medications on file as of 10/13/2017.     Surgical History: Past Surgical History:  Procedure Laterality Date  . ABDOMINAL HYSTERECTOMY     total  . ABDOMINAL HYSTERECTOMY    . BREAST BIOPSY Bilateral 2012  . BREAST BIOPSY Right 08-07-12   fibroadenomatous  changes and columnar cells  . BREAST BIOPSY  02/03/2015   stereo byrnett  . CHOLECYSTECTOMY N/A 02/27/2016   Procedure: LAPAROSCOPIC CHOLECYSTECTOMY;  Surgeon: Christene Lye, MD;  Location: [facility] ORS;  Service: General;  Laterality: N/A;  . JOINT REPLACEMENT Left   . KNEE CLOSED REDUCTION Left 04/15/2015   Procedure: CLOSED MANIPULATION KNEE;  Surgeon: Thornton Park, MD;  Location: [facility] ORS;  Service: Orthopedics;  Laterality: Left;  . KNEE SURGERY    . TEE WITHOUT CARDIOVERSION N/A 09/13/2017   Procedure: TRANSESOPHAGEAL ECHOCARDIOGRAM (TEE);  Surgeon: Nelva Bush, MD;  Location: [facility] ORS;  Service: Cardiovascular;   Laterality: N/A;  . TOTAL KNEE ARTHROPLASTY Left 12/25/2014   Procedure: TOTAL KNEE ARTHROPLASTY;  Surgeon: Thornton Park, MD;  Location: [facility] ORS;  Service: Orthopedics;  Laterality: Left;  . TUBAL LIGATION      Medical History: Past Medical History:  Diagnosis Date  . Allergy   . Anemia    h/o  . Anxiety   . BRCA negative 03/22/2013  . Bronchitis 02/19/2016   ON LEVAQUIN PO  . Cigarette smoker 09/11/2017   8-10 day  . GERD (gastroesophageal reflux disease)   . History of kidney stones   . Hyperlipidemia   . Hypertension     Family History: Family History  Problem Relation Age of Onset  . Cancer Mother 46       breast  . Cancer Maternal Aunt        breast  . Cancer Maternal Grandmother        breast    Social History   Socioeconomic History  . Marital status: Married    Spouse name: Not on file  . Number of children: Not on file  . Years of education: Not on file  . Highest education level: Not on file  Occupational History  . Not on file  Social Needs  . Financial resource strain: Not on file  . Food insecurity:    Worry: Not on file    Inability: Not on file  . Transportation needs:    Medical: Not on file    Non-medical: Not on file  Tobacco Use  . Smoking status: Current Every Day Smoker    Packs/day: 0.50    Years: 20.00    Pack years: 10.00    Types: Cigarettes  . Smokeless tobacco: Never Used  . Tobacco comment: down to 2-3 daily  Substance and Sexual Activity  . Alcohol use: Not Currently    Alcohol/week: 2.0 standard drinks    Types: 2 Glasses of wine per week    Comment: wine occ  . Drug use: No  . Sexual activity: Not on file  Lifestyle  . Physical activity:    Days per week: Not on file    Minutes per session: Not on file  . Stress: Not on file  Relationships  . Social connections:    Talks on phone: Not on file    Gets together: Not on file    Attends religious service: Not on file    Active member of club or organization:  Not on file    Attends meetings of clubs or organizations: Not on file    Relationship status: Not on file  . Intimate partner violence:    Fear of current or ex partner: Not on file    Emotionally abused: Not on file    Physically abused: Not on file    Forced sexual activity: Not on file  Other Topics Concern  .  Not on file  Social History Narrative  . Not on file      Review of Systems  Constitutional: Positive for activity change and fatigue. Negative for chills and unexpected weight change.  HENT: Negative for congestion, postnasal drip, rhinorrhea, sneezing and sore throat.   Eyes: Negative.  Negative for redness.  Respiratory: Positive for shortness of breath. Negative for cough, chest tightness and wheezing.   Cardiovascular: Positive for palpitations and leg swelling. Negative for chest pain.       Improving.  Gastrointestinal: Negative for abdominal pain, constipation, diarrhea, nausea and vomiting.  Endocrine: Negative for cold intolerance, heat intolerance, polydipsia, polyphagia and polyuria.  Genitourinary: Negative.  Negative for dysuria and frequency.  Musculoskeletal: Negative for arthralgias, back pain, joint swelling and neck pain.  Skin: Negative for rash.  Allergic/Immunologic: Positive for environmental allergies.  Neurological: Positive for dizziness. Negative for tremors, numbness and headaches.  Hematological: Negative for adenopathy. Does not bruise/bleed easily.  Psychiatric/Behavioral: Negative for behavioral problems (Depression), sleep disturbance and suicidal ideas. The patient is nervous/anxious.     Vital Signs: BP 135/73 (BP Location: Right Arm, Patient Position: Sitting, Cuff Size: Large)   Pulse 68   Resp 16   Ht '5\' 4"'  (1.626 m)   Wt 235 lb 12.8 oz (107 kg)   SpO2 95%   BMI 40.47 kg/m    Physical Exam  Constitutional: She is oriented to person, place, and time. She appears well-developed and well-nourished. No distress.  HENT:  Head:  Normocephalic and atraumatic.  Nose: Nose normal.  Mouth/Throat: Oropharynx is clear and moist. No oropharyngeal exudate.  Eyes: Pupils are equal, round, and reactive to light. Conjunctivae and EOM are normal.  Neck: Normal range of motion. Neck supple. No JVD present. No tracheal deviation present. No thyromegaly present.  Cardiovascular: Normal rate, regular rhythm and normal heart sounds. Exam reveals no gallop and no friction rub.  No murmur heard. Very soft, systolic murmur auscultated.  Pulmonary/Chest: Effort normal and breath sounds normal. No respiratory distress. She has no wheezes. She has no rales. She exhibits no tenderness.  Abdominal: Soft. Bowel sounds are normal. There is no tenderness.  Musculoskeletal: Normal range of motion.  Lymphadenopathy:    She has no cervical adenopathy.  Neurological: She is alert and oriented to person, place, and time. No cranial nerve deficit.  Skin: Skin is warm and dry. Capillary refill takes less than 2 seconds. She is not diaphoretic.  Psychiatric: Her speech is normal and behavior is normal. Judgment and thought content normal. Her mood appears anxious. Cognition and memory are normal.  Nursing note and vitals reviewed.  Assessment/Plan: 1. Atrial fibrillation, unspecified type ([place]) Improving. Today, with regular rate and rhythm. Will continue regular visits with cardiology. She is cleared to return to work on Monday, October 17, 2017.  2. Mitral valve stenosis, unspecified etiology Continue to follow up with cardiology.   3. Tobacco dependency Improving. Continue using nicotine patch. Encouraged her to continue to cut back her cigarette smoking until she is no longer smoking at all.   General Counseling: Surie verbalizes understanding of the findings of todays visit and agrees with plan of treatment. I have discussed any further diagnostic evaluation that may be needed or ordered today. We also reviewed her medications today. she  has been encouraged to call the office with any questions or concerns that should arise related to todays visit.   This patient was seen by Leretha Pol FNP Collaboration with Dr Lavera Guise as  a part of collaborative care agreement   Time spent: 25 Minutes   Dr Lavera Guise Internal medicine

## 2017-10-19 NOTE — Telephone Encounter (Signed)
Received paperwork from [place] in nurses bin for completion

## 2017-10-19 NOTE — Telephone Encounter (Signed)
Received paper work from Nurse, adult of to [employer] for completion

## 2017-10-19 NOTE — Telephone Encounter (Signed)
Patient returning call.

## 2017-10-19 NOTE — Telephone Encounter (Signed)
Spoke with patient and reviewed her forms that needed to be completed. She returned to work on 10/17/17 and finished forms and providing ciox forms to [facility] for further processing. She was appreciative for the call and completion of paperwork with no further questions at this time.

## 2017-10-19 NOTE — Telephone Encounter (Signed)
Left voicemail message to call back  

## 2017-10-19 NOTE — Telephone Encounter (Signed)
Attending Physicians Statement from Dolores given to Dr. Donivan Scull nurse, Pam to review with MD.

## 2017-10-24 ENCOUNTER — Other Ambulatory Visit: Payer: Self-pay

## 2017-10-25 ENCOUNTER — Other Ambulatory Visit: Payer: Self-pay

## 2017-10-25 MED ORDER — ALPRAZOLAM 0.5 MG PO TABS: 0.5000 mg | ORAL_TABLET | Freq: Every evening | ORAL | 2 refills | Status: DC | PRN

## 2017-10-25 NOTE — Telephone Encounter (Signed)
AS PER DR Humphrey Rolls CALLED IN PRES FOR XANAX 0.5MG  ONE TAB PO QHS 30 WITH 2 REFILLS

## 2017-10-26 ENCOUNTER — Ambulatory Visit (INDEPENDENT_AMBULATORY_CARE_PROVIDER_SITE_OTHER): Payer: BLUE CROSS/BLUE SHIELD

## 2017-10-26 DIAGNOSIS — I4891 Unspecified atrial fibrillation: Secondary | ICD-10-CM | POA: Diagnosis not present

## 2017-10-26 DIAGNOSIS — I050 Rheumatic mitral stenosis: Secondary | ICD-10-CM

## 2017-10-26 DIAGNOSIS — Z5181 Encounter for therapeutic drug level monitoring: Secondary | ICD-10-CM | POA: Diagnosis not present

## 2017-10-26 LAB — POCT INR: INR: 3 (ref 2.0–3.0)

## 2017-10-26 NOTE — Patient Instructions (Signed)
Please continue dosage of tablet (2.5 mg) every day EXCEPT 1/2 ON MONDAYS, Victor. Recheck in 2 weeks.

## 2017-10-31 ENCOUNTER — Other Ambulatory Visit: Payer: Self-pay

## 2017-11-03 ENCOUNTER — Other Ambulatory Visit: Payer: Self-pay | Admitting: Cardiovascular Disease

## 2017-11-03 NOTE — Telephone Encounter (Signed)
Refill Request.  

## 2017-11-08 ENCOUNTER — Telehealth: Payer: Self-pay

## 2017-11-08 ENCOUNTER — Other Ambulatory Visit: Payer: Self-pay

## 2017-11-08 MED ORDER — POTASSIUM CHLORIDE ER 10 MEQ PO TBCR: 10.0000 meq | EXTENDED_RELEASE_TABLET | Freq: Every day | ORAL | 3 refills | Status: DC

## 2017-11-08 MED ORDER — WARFARIN SODIUM 2.5 MG PO TABS: 2.5000 mg | ORAL_TABLET | Freq: Every day | ORAL | 0 refills | Status: DC

## 2017-11-08 MED ORDER — METOPROLOL SUCCINATE ER 50 MG PO TB24: 50.0000 mg | ORAL_TABLET | Freq: Every day | ORAL | 11 refills | Status: DC

## 2017-11-08 NOTE — Telephone Encounter (Signed)
Please review warfarin for refill.

## 2017-11-08 NOTE — Telephone Encounter (Signed)
Please review for refill of warfarin. [facility]

## 2017-11-08 NOTE — Telephone Encounter (Signed)
Rx for Potassium and Metoprolol [facility].

## 2017-11-09 ENCOUNTER — Other Ambulatory Visit: Payer: Self-pay | Admitting: Nurse Practitioner

## 2017-11-09 DIAGNOSIS — B379 Candidiasis, unspecified: Secondary | ICD-10-CM

## 2017-11-09 MED ORDER — FLUCONAZOLE 150 MG PO TABS: ORAL_TABLET | ORAL | 2 refills | Status: DC

## 2017-11-09 NOTE — Progress Notes (Signed)
Renewed patient's prescription for fluconazole to take when needed for yeast infection. Request pade per patient's pharmacy.

## 2017-11-10 DIAGNOSIS — Z96652 Presence of left artificial knee joint: Secondary | ICD-10-CM | POA: Diagnosis not present

## 2017-11-10 DIAGNOSIS — M5416 Radiculopathy, lumbar region: Secondary | ICD-10-CM | POA: Diagnosis not present

## 2017-11-10 DIAGNOSIS — M25561 Pain in right knee: Secondary | ICD-10-CM | POA: Diagnosis not present

## 2017-11-16 ENCOUNTER — Ambulatory Visit (INDEPENDENT_AMBULATORY_CARE_PROVIDER_SITE_OTHER): Payer: BLUE CROSS/BLUE SHIELD

## 2017-11-16 DIAGNOSIS — I4891 Unspecified atrial fibrillation: Secondary | ICD-10-CM

## 2017-11-16 DIAGNOSIS — I050 Rheumatic mitral stenosis: Secondary | ICD-10-CM

## 2017-11-16 DIAGNOSIS — Z5181 Encounter for therapeutic drug level monitoring: Secondary | ICD-10-CM | POA: Diagnosis not present

## 2017-11-16 LAB — POCT INR: INR: 1.1 — AB (ref 2.0–3.0)

## 2017-11-16 NOTE — Patient Instructions (Signed)
Please take 1 tablet today, 1.5 tablets tomorrow and Friday, then START NEW DOSAGE of tablet (2.5 mg) every day.  Recheck in 1 week.

## 2017-11-18 ENCOUNTER — Telehealth: Payer: Self-pay

## 2017-11-18 ENCOUNTER — Other Ambulatory Visit: Payer: Self-pay | Admitting: *Deleted

## 2017-11-18 MED ORDER — POTASSIUM CHLORIDE ER 10 MEQ PO TBCR: 10.0000 meq | EXTENDED_RELEASE_TABLET | Freq: Every day | ORAL | 3 refills | Status: DC

## 2017-11-18 NOTE — Telephone Encounter (Signed)
[facility]ed pres back with [facility] and pt gets that med from local phar and we not prescribing 90 days pres already done on 10/19/17 with 2 refills at [facility]

## 2017-11-21 ENCOUNTER — Other Ambulatory Visit: Payer: Self-pay

## 2017-11-21 MED ORDER — FUROSEMIDE 20 MG PO TABS: 20.0000 mg | ORAL_TABLET | Freq: Every day | ORAL | 5 refills | Status: DC

## 2017-11-21 MED ORDER — DILTIAZEM HCL ER COATED BEADS 180 MG PO CP24: 180.0000 mg | ORAL_CAPSULE | Freq: Every day | ORAL | 5 refills | Status: DC

## 2017-11-23 ENCOUNTER — Ambulatory Visit (INDEPENDENT_AMBULATORY_CARE_PROVIDER_SITE_OTHER): Payer: BLUE CROSS/BLUE SHIELD

## 2017-11-23 ENCOUNTER — Other Ambulatory Visit: Payer: Self-pay

## 2017-11-23 DIAGNOSIS — Z5181 Encounter for therapeutic drug level monitoring: Secondary | ICD-10-CM | POA: Diagnosis not present

## 2017-11-23 DIAGNOSIS — I050 Rheumatic mitral stenosis: Secondary | ICD-10-CM | POA: Diagnosis not present

## 2017-11-23 DIAGNOSIS — I4891 Unspecified atrial fibrillation: Secondary | ICD-10-CM | POA: Diagnosis not present

## 2017-11-23 LAB — POCT INR: INR: 1.5 — AB (ref 2.0–3.0)

## 2017-11-23 MED ORDER — POTASSIUM CHLORIDE ER 10 MEQ PO TBCR: 10.0000 meq | EXTENDED_RELEASE_TABLET | Freq: Every day | ORAL | 3 refills | Status: DC

## 2017-11-23 MED ORDER — DILTIAZEM HCL ER COATED BEADS 180 MG PO CP24: 180.0000 mg | ORAL_CAPSULE | Freq: Every day | ORAL | 3 refills | Status: DC

## 2017-11-23 NOTE — Patient Instructions (Signed)
Please take 2 tablets today, tomorrow and Friday, then START NEW DOSAGE of tablet (2.5 mg) every day EXCEPT 2 TABLETS ON Mondays & Fridays.  Recheck in 2 weeks.

## 2017-12-05 ENCOUNTER — Encounter: Payer: Self-pay | Admitting: Adult Health

## 2017-12-05 ENCOUNTER — Ambulatory Visit (INDEPENDENT_AMBULATORY_CARE_PROVIDER_SITE_OTHER): Payer: BLUE CROSS/BLUE SHIELD | Admitting: Adult Health

## 2017-12-05 VITALS — BP 140/78 | HR 78 | Temp 98.3°F | Resp 16 | Ht 64.0 in | Wt 241.0 lb

## 2017-12-05 DIAGNOSIS — J069 Acute upper respiratory infection, unspecified: Secondary | ICD-10-CM

## 2017-12-05 DIAGNOSIS — F17219 Nicotine dependence, cigarettes, with unspecified nicotine-induced disorders: Secondary | ICD-10-CM

## 2017-12-05 DIAGNOSIS — R05 Cough: Secondary | ICD-10-CM | POA: Diagnosis not present

## 2017-12-05 DIAGNOSIS — J029 Acute pharyngitis, unspecified: Secondary | ICD-10-CM | POA: Diagnosis not present

## 2017-12-05 DIAGNOSIS — R059 Cough, unspecified: Secondary | ICD-10-CM

## 2017-12-05 LAB — POCT INFLUENZA A/B
Influenza A, POC: NEGATIVE
Influenza B, POC: NEGATIVE

## 2017-12-05 MED ORDER — AZITHROMYCIN 250 MG PO TABS: ORAL_TABLET | ORAL | 0 refills | Status: DC

## 2017-12-05 NOTE — Patient Instructions (Signed)
Upper Respiratory Infection, Adult Most upper respiratory infections (URIs) are caused by a virus. A URI affects the nose, throat, and upper air passages. The most common type of URI is often called "the common cold." Follow these instructions at home:  Take medicines only as told by your doctor.  Gargle warm saltwater or take cough drops to comfort your throat as told by your doctor.  Use a warm mist humidifier or inhale steam from a shower to increase air moisture. This may make it easier to breathe.  Drink enough fluid to keep your pee (urine) clear or pale yellow.  Eat soups and other clear broths.  Have a healthy diet.  Rest as needed.  Go back to work when your fever is gone or your doctor says it is okay. ? You may need to stay home longer to avoid giving your URI to others. ? You can also wear a face mask and wash your hands often to prevent spread of the virus.  Use your inhaler more if you have asthma.  Do not use any tobacco products, including cigarettes, chewing tobacco, or electronic cigarettes. If you need help quitting, ask your doctor. Contact a doctor if:  You are getting worse, not better.  Your symptoms are not helped by medicine.  You have chills.  You are getting more short of breath.  You have brown or red mucus.  You have yellow or brown discharge from your nose.  You have pain in your face, especially when you bend forward.  You have a fever.  You have puffy (swollen) neck glands.  You have pain while swallowing.  You have white areas in the back of your throat. Get help right away if:  You have very bad or constant: ? Headache. ? Ear pain. ? Pain in your forehead, behind your eyes, and over your cheekbones (sinus pain). ? Chest pain.  You have long-lasting (chronic) lung disease and any of the following: ? Wheezing. ? Long-lasting cough. ? Coughing up blood. ? A change in your usual mucus.  You have a stiff neck.  You have  changes in your: ? Vision. ? Hearing. ? Thinking. ? Mood. This information is not intended to replace advice given to you by your health care provider. Make sure you discuss any questions you have with your health care provider. Document Released: 06/23/2007 Document Revised: 09/07/2015 Document Reviewed: 04/11/2013 Elsevier Interactive Patient Education  2018 Elsevier Inc.  

## 2017-12-05 NOTE — Progress Notes (Signed)
[facility] [place], [place] [address]  Internal MEDICINE  Office Visit Note  Patient Name: Leslie [name]  [id number]  [id number]  Date of Service: 12/05/2017  Chief Complaint  Patient presents with  . Cough  . Sore Throat     HPI Pt is here for a sick visit. She reports 4 days of cough, sore throat, wheezing, SOB, nasal congestion, runny nose, and body aches.  She reports she has had a minor headache which is very unusual for her.  She did get a flu shot this year.  She has been taking OTC Mucinex, and aleve with minor relief. She reports multiple people at work are sick, and her grand children are too.      Current Medication:  Outpatient Encounter Medications as of 12/05/2017  Medication Sig  . albuterol (PROVENTIL HFA;VENTOLIN HFA) 108 (90 BASE) MCG/ACT inhaler Inhale 2 puffs into the lungs every 6 (six) hours as needed for wheezing or shortness of breath.  . ALPRAZolam (XANAX) 0.5 MG tablet Take 1 tablet (0.5 mg total) by mouth at bedtime as needed for anxiety.  Marland Kitchen atorvastatin (LIPITOR) 20 MG tablet Take 1 tablet (20 mg total) by mouth daily at 6 PM.  . cetirizine (ZYRTEC) 10 MG tablet Take 1 tablet (10 mg total) by mouth daily.  Marland Kitchen diltiazem (CARDIZEM CD) 180 MG 24 hr capsule Take 1 capsule (180 mg total) by mouth daily.  Marland Kitchen estradiol (ESTRACE) 0.5 MG tablet Take 1 tablet (0.5 mg total) by mouth daily.  . fluconazole (DIFLUCAN) 150 MG tablet Take 1 tablet po once. May repeat dose in three days for persistent symptoms.  . fluticasone (FLONASE) 50 MCG/ACT nasal spray Place 2 sprays into both nostrils daily as needed for allergies or rhinitis.  . furosemide (LASIX) 20 MG tablet Take 1 tablet (20 mg total) by mouth daily.  . metoprolol succinate (TOPROL-XL) 50 MG 24 hr tablet Take 1 tablet (50 mg total) by mouth daily. Take with or immediately following a meal.  . montelukast (SINGULAIR) 10 MG tablet Take 1 tablet (10 mg total) by mouth at bedtime.   . Multiple Vitamin (MULTIVITAMIN) capsule Take 1 capsule by mouth daily.  . nicotine (NICODERM CQ - DOSED IN MG/24 HOURS) 21 mg/24hr patch Place 1 patch (21 mg total) onto the skin daily.  Marland Kitchen nystatin cream (MYCOSTATIN) Apply 1 application topically 3 (three) times daily.  Marland Kitchen omeprazole (PRILOSEC) 40 MG capsule Take 1 capsule (40 mg total) by mouth daily.  . potassium chloride (K-DUR) 10 MEQ tablet Take 1 tablet (10 mEq total) by mouth daily.  . traMADol (ULTRAM) 50 MG tablet Take 1 tablet (50 mg total) by mouth every 6 (six) hours as needed for moderate pain.  Marland Kitchen warfarin (COUMADIN) 2.5 MG tablet Take 1 tablet (2.5 mg total) by mouth daily. As directed by the coumadin clinic.  Marland Kitchen azithromycin (ZITHROMAX) 250 MG tablet Take as directed.   No facility-administered encounter medications on file as of 12/05/2017.       Medical History: Past Medical History:  Diagnosis Date  . Allergy   . Anemia    h/o  . Anxiety   . BRCA negative 03/22/2013  . Bronchitis 02/19/2016   ON LEVAQUIN PO  . Cigarette smoker 09/11/2017   8-10 day  . GERD (gastroesophageal reflux disease)   . History of kidney stones   . Hyperlipidemia   . Hypertension      Vital Signs: BP 140/78 (BP Location: Left Arm, Patient Position:  Sitting, Cuff Size: Normal)   Pulse 78   Temp 98.3 F (36.8 C) (Oral)   Resp 16   Ht '5\' 4"'  (1.626 m)   Wt 241 lb (109.3 kg)   SpO2 97%   BMI 41.37 kg/m    Review of Systems  Constitutional: Positive for chills and fever. Negative for fatigue and unexpected weight change.  HENT: Positive for postnasal drip, rhinorrhea and sinus pressure. Negative for congestion, sneezing and sore throat.   Eyes: Negative for photophobia, pain and redness.  Respiratory: Positive for cough and wheezing. Negative for chest tightness and shortness of breath.   Cardiovascular: Negative for chest pain and palpitations.  Gastrointestinal: Negative for abdominal pain, constipation, diarrhea, nausea and  vomiting.  Endocrine: Negative.   Genitourinary: Negative for dysuria and frequency.  Musculoskeletal: Negative for arthralgias, back pain, joint swelling and neck pain.  Skin: Negative for rash.  Allergic/Immunologic: Negative.   Neurological: Negative for tremors and numbness.  Hematological: Negative for adenopathy. Does not bruise/bleed easily.  Psychiatric/Behavioral: Negative for behavioral problems and sleep disturbance. The patient is not nervous/anxious.     Physical Exam  Constitutional: She is oriented to person, place, and time. She appears well-developed and well-nourished. No distress.  HENT:  Head: Normocephalic and atraumatic.  Mouth/Throat: Oropharynx is clear and moist. No oropharyngeal exudate.  Eyes: Pupils are equal, round, and reactive to light. EOM are normal.  Neck: Normal range of motion. Neck supple. No JVD present. No tracheal deviation present. No thyromegaly present.  Cardiovascular: Normal rate, regular rhythm and normal heart sounds. Exam reveals no gallop and no friction rub.  No murmur heard. Pulmonary/Chest: Effort normal and breath sounds normal. No respiratory distress. She has no wheezes. She has no rales. She exhibits no tenderness.  Abdominal: Soft. There is no tenderness. There is no guarding.  Musculoskeletal: Normal range of motion.  Lymphadenopathy:    She has no cervical adenopathy.  Neurological: She is alert and oriented to person, place, and time. No cranial nerve deficit.  Skin: Skin is warm and dry. She is not diaphoretic.  Psychiatric: She has a normal mood and affect. Her behavior is normal. Judgment and thought content normal.  Nursing note and vitals reviewed.  Assessment/Plan: 1. Acute upper respiratory infection Give patient Z-Pak for infection as well as inflammation.  Encourage patient to return to clinic if no better in 7 to 10 days. - azithromycin (ZITHROMAX) 250 MG tablet; Take as directed.  Dispense: 6 tablet; Refill:  0  2. Cough Continue supportive therapy for cough.  Azithromycin will also help with inflammation.  3. Sore throat Negative for Flu A and B - POCT Influenza A/B  4. Cigarette nicotine dependence with nicotine-induced disorder Smoking cessation counseling: 1. Pt acknowledges the risks of long term smoking, she will try to quite smoking. 2. Options for different medications including nicotine products, chewing gum, patch etc, Wellbutrin and Chantix is discussed 3. Goal and date of compete cessation is discussed 4. Total time spent in smoking cessation is 15 min.   General Counseling: Kyliana verbalizes understanding of the findings of todays visit and agrees with plan of treatment. I have discussed any further diagnostic evaluation that may be needed or ordered today. We also reviewed her medications today. she has been encouraged to call the office with any questions or concerns that should arise related to todays visit.   Orders Placed This Encounter  Procedures  . POCT Influenza A/B    Meds ordered this encounter  Medications  .  azithromycin (ZITHROMAX) 250 MG tablet    Sig: Take as directed.    Dispense:  6 tablet    Refill:  0    Time spent: 25 Minutes  This patient was seen by Orson Gear AGNP-C in Collaboration with Dr Lavera Guise as a part of collaborative care agreement.  Kendell Bane AGNP-C Internal Medicine

## 2017-12-07 ENCOUNTER — Ambulatory Visit (INDEPENDENT_AMBULATORY_CARE_PROVIDER_SITE_OTHER): Payer: BLUE CROSS/BLUE SHIELD

## 2017-12-07 DIAGNOSIS — I050 Rheumatic mitral stenosis: Secondary | ICD-10-CM | POA: Diagnosis not present

## 2017-12-07 DIAGNOSIS — I4891 Unspecified atrial fibrillation: Secondary | ICD-10-CM | POA: Diagnosis not present

## 2017-12-07 DIAGNOSIS — Z5181 Encounter for therapeutic drug level monitoring: Secondary | ICD-10-CM | POA: Diagnosis not present

## 2017-12-07 LAB — POCT INR: INR: 4 — AB (ref 2.0–3.0)

## 2017-12-07 NOTE — Patient Instructions (Signed)
Please skip coumadin today, tomorrow and Friday, then continue of tablet (2.5 mg) every day EXCEPT 2 TABLETS ON Mondays & Fridays.  Recheck in 2 weeks.

## 2017-12-08 ENCOUNTER — Telehealth: Payer: Self-pay | Admitting: Adult Health

## 2017-12-08 ENCOUNTER — Other Ambulatory Visit: Payer: Self-pay | Admitting: Adult Health

## 2017-12-08 DIAGNOSIS — R059 Cough, unspecified: Secondary | ICD-10-CM

## 2017-12-08 DIAGNOSIS — R05 Cough: Secondary | ICD-10-CM

## 2017-12-08 NOTE — Telephone Encounter (Signed)
Pt called and states that the zpak that was given to her on Monday does not seem to be helping any, she still has sore throat , still coughing , and feels sob, maybe needs a stronger antibiotic, Please advise

## 2017-12-08 NOTE — Progress Notes (Signed)
  Pt reports continued cough despite 4 days of Zpak.  Will get CXR and evaluate from there.

## 2017-12-08 NOTE — Telephone Encounter (Signed)
Pt notified of orders for chest xray,

## 2017-12-12 ENCOUNTER — Other Ambulatory Visit: Payer: Self-pay | Admitting: Adult Health

## 2017-12-12 MED ORDER — LEVOFLOXACIN 500 MG PO TABS: 500.0000 mg | ORAL_TABLET | Freq: Every day | ORAL | 0 refills | Status: DC

## 2017-12-12 NOTE — Progress Notes (Signed)
Levaquin sent for patient URI.

## 2017-12-21 ENCOUNTER — Ambulatory Visit (INDEPENDENT_AMBULATORY_CARE_PROVIDER_SITE_OTHER): Payer: BLUE CROSS/BLUE SHIELD

## 2017-12-21 DIAGNOSIS — I4891 Unspecified atrial fibrillation: Secondary | ICD-10-CM | POA: Diagnosis not present

## 2017-12-21 DIAGNOSIS — Z5181 Encounter for therapeutic drug level monitoring: Secondary | ICD-10-CM | POA: Diagnosis not present

## 2017-12-21 DIAGNOSIS — I050 Rheumatic mitral stenosis: Secondary | ICD-10-CM | POA: Diagnosis not present

## 2017-12-21 LAB — POCT INR: INR: 3.7 — AB (ref 2.0–3.0)

## 2017-12-21 NOTE — Patient Instructions (Signed)
Please skip coumadin today, then continue of tablet (2.5 mg) every day EXCEPT 2 TABLETS ON Mondays & Fridays.  Recheck in 2 & 1/2 weeks.

## 2018-01-05 ENCOUNTER — Telehealth: Payer: Self-pay | Admitting: Cardiovascular Disease

## 2018-01-05 NOTE — Telephone Encounter (Signed)
Pt c/o of Chest Pain: STAT if CP now or developed within 24 hours  1. Are you having CP right now? Chest pressure and right arm pain, HR was 135 earlier this a.m.   2. Are you experiencing any other symptoms (ex. SOB, nausea, vomiting, sweating)? SOB, heart is racing and feels like heartburn  3. How long have you been experiencing CP?  Last night  4. Is your CP continuous or coming and going? continuous  5. Have you taken Nitroglycerin? No  Pt did call EMT this morning, but by the time they arrived, her HR had slowed down.  ?

## 2018-01-05 NOTE — Telephone Encounter (Signed)
Spoke with patient and she states that her husband called EMS this morning due to heart rate of 132, heart burn, sweaty, short of breath, clammy, pain in right arm. EMT arrived and heart rate was 99-100. Has not checked heart rate or blood pressure since this morning. She did have a little chest pain but it is better and the shortness of breath has also resolved. She states this all started while she was at rest and it woke her from sleep. Reviewed signs and symptoms to monitor for that would require immediate evaluation in the ED and scheduled her to come in tomorrow to see provider. Instructed her to please monitor her heart rate and blood pressure to bring into her visit tomorrow. She verbalized understanding of our conversation, agreement with plan, and had no further questions at this time.

## 2018-01-06 ENCOUNTER — Encounter: Payer: Self-pay | Admitting: Nurse Practitioner

## 2018-01-06 ENCOUNTER — Ambulatory Visit (INDEPENDENT_AMBULATORY_CARE_PROVIDER_SITE_OTHER): Payer: BLUE CROSS/BLUE SHIELD | Admitting: Nurse Practitioner

## 2018-01-06 VITALS — BP 140/72 | HR 85 | Ht 64.0 in | Wt 235.8 lb

## 2018-01-06 DIAGNOSIS — R079 Chest pain, unspecified: Secondary | ICD-10-CM

## 2018-01-06 DIAGNOSIS — I10 Essential (primary) hypertension: Secondary | ICD-10-CM

## 2018-01-06 DIAGNOSIS — I050 Rheumatic mitral stenosis: Secondary | ICD-10-CM

## 2018-01-06 DIAGNOSIS — I480 Paroxysmal atrial fibrillation: Secondary | ICD-10-CM

## 2018-01-06 DIAGNOSIS — R002 Palpitations: Secondary | ICD-10-CM | POA: Diagnosis not present

## 2018-01-06 DIAGNOSIS — I4891 Unspecified atrial fibrillation: Secondary | ICD-10-CM | POA: Diagnosis not present

## 2018-01-06 MED ORDER — DILTIAZEM HCL ER COATED BEADS 240 MG PO CP24: 240.0000 mg | ORAL_CAPSULE | Freq: Every day | ORAL | 3 refills | Status: DC

## 2018-01-06 NOTE — Progress Notes (Signed)
Office Visit    Patient Name: Leslie [name] Date of Encounter: 01/06/2018  Primary Care Provider:  Ronnell Freshwater, NP Primary Cardiologist:  Ida Rogue, MD  Chief Complaint    52 year old female with a history of obesity, sleep apnea, anxiety, hyperlipidemia, tobacco abuse, hypertension, diastolic dysfunction, and atrial fibrillation, who presents for follow-up related to palpitations.  Past Medical History    Past Medical History:  Diagnosis Date  . (HFpEF) heart failure with preserved ejection fraction ([place])    a. 08/2017 Echo: EF 55-60%.  Grade 2 diastolic dysfunction.  Moderate mitral stenosis.  . Allergy   . Anemia   . Anxiety   . BRCA negative 03/22/2013  . Bronchitis 02/19/2016   ON LEVAQUIN PO  . Chest tightness   . Cigarette smoker 09/11/2017   8-10 day  . GERD (gastroesophageal reflux disease)   . History of kidney stones   . Hyperlipidemia   . Hypertension   . Moderate mitral stenosis    a.  08/2017 TEE: EF 60 to 65%.  Moderate mitral stenosis.  Mean gradient 14 mmHg.  Valve area 2.59 cm by planimetry, 2.72 cm by pressure half-time.  . Palpitations   . Persistent atrial fibrillation    a. 08/2017 s/p TEE/DCCV; b. CHA2DS2VASc = 2-->warfarin.   Past Surgical History:  Procedure Laterality Date  . ABDOMINAL HYSTERECTOMY     total  . ABDOMINAL HYSTERECTOMY    . BREAST BIOPSY Bilateral 2012  . BREAST BIOPSY Right 08-07-12   fibroadenomatous changes and columnar cells  . BREAST BIOPSY  02/03/2015   stereo byrnett  . CHOLECYSTECTOMY N/A 02/27/2016   Procedure: LAPAROSCOPIC CHOLECYSTECTOMY;  Surgeon: Christene Lye, MD;  Location: [facility] ORS;  Service: General;  Laterality: N/A;  . JOINT REPLACEMENT Left   . KNEE CLOSED REDUCTION Left 04/15/2015   Procedure: CLOSED MANIPULATION KNEE;  Surgeon: Thornton Park, MD;  Location: [facility] ORS;  Service: Orthopedics;  Laterality: Left;  . KNEE SURGERY    . TEE WITHOUT CARDIOVERSION N/A 09/13/2017   Procedure: TRANSESOPHAGEAL ECHOCARDIOGRAM (TEE);  Surgeon: Nelva Bush, MD;  Location: [facility] ORS;  Service: Cardiovascular;  Laterality: N/A;  . TOTAL KNEE ARTHROPLASTY Left 12/25/2014   Procedure: TOTAL KNEE ARTHROPLASTY;  Surgeon: Thornton Park, MD;  Location: [facility] ORS;  Service: Orthopedics;  Laterality: Left;  . TUBAL LIGATION      Allergies  Allergies  Allergen Reactions  . Morphine Hives and Rash  . Oxycodone Hcl Itching  . Dilaudid [Hydromorphone Hcl] Itching  . Morphine And Related Hives  . Oxycodone Itching and Rash    History of Present Illness    52 year old female with the above past medical history including obesity, sleep apnea, anxiety, hyperlipidemia, tobacco abuse, hypertension, diastolic dysfunction, and atrial fibrillation.  A. fib was diagnosed in August 2019 in the setting of chest tightness and hospitalization.  She underwent TEE which showed moderate mitral stenosis and was subsequently cardioverted.  She has been anticoagulated on Coumadin in the setting of valvular A. fib.  She was last seen in clinic in August but has been followed in Coumadin clinic closely.  She was in her usual state of health until yesterday, when she awoke suddenly with tachypalpitations and rates in the 130s.  Her husband called EMS and by the time they arrived, she felt somewhat better and she was apparently in sinus rhythm in the 80s to 90s.  She went to work and says that she noted intermittent increases in heart rate and she thought  maybe she was having some recurrence of A. fib.  As result she called the office and was placed on my schedule today.  She has not had any recurrent symptoms today.  Of note, she did have some right arm as well as mid sternal chest discomfort during her episode of rapid heart rate yesterday morning.  She has never undergone ischemic evaluation.  She denies PND, orthopnea, dizziness, syncope, edema, or early satiety.  Home Medications    Prior to Admission  medications   Medication Sig Start Date End Date Taking? Authorizing Provider  albuterol (PROVENTIL HFA;VENTOLIN HFA) 108 (90 BASE) MCG/ACT inhaler Inhale 2 puffs into the lungs every 6 (six) hours as needed for wheezing or shortness of breath.   Yes [provider]  ALPRAZolam Duanne Moron) 0.5 MG tablet Take 1 tablet (0.5 mg total) by mouth at bedtime as needed for anxiety. 10/25/17  Yes Lavera Guise, MD  atorvastatin (LIPITOR) 20 MG tablet Take 1 tablet (20 mg total) by mouth daily at 6 PM. 08/22/17  Yes Boscia, Greer Ee, NP  cetirizine (ZYRTEC) 10 MG tablet Take 1 tablet (10 mg total) by mouth daily. 07/14/17  Yes Boscia, Greer Ee, NP  diltiazem (CARDIZEM CD) 180 MG 24 hr capsule Take 1 capsule (180 mg total) by mouth daily. 11/23/17  Yes Gollan, Kathlene November, MD  estradiol (ESTRACE) 0.5 MG tablet Take 1 tablet (0.5 mg total) by mouth daily. 08/22/17  Yes Boscia, Greer Ee, NP  fluconazole (DIFLUCAN) 150 MG tablet Take 1 tablet po once. May repeat dose in three days for persistent symptoms. 11/09/17  Yes Boscia, Greer Ee, NP  fluticasone (FLONASE) 50 MCG/ACT nasal spray Place 2 sprays into both nostrils daily as needed for allergies or rhinitis.   Yes [provider]  furosemide (LASIX) 20 MG tablet Take 1 tablet (20 mg total) by mouth daily. 11/21/17  Yes Minna Merritts, MD  metoprolol succinate (TOPROL-XL) 50 MG 24 hr tablet Take 1 tablet (50 mg total) by mouth daily. Take with or immediately following a meal. 11/08/17  Yes Gollan, Kathlene November, MD  montelukast (SINGULAIR) 10 MG tablet Take 1 tablet (10 mg total) by mouth at bedtime. 08/22/17  Yes Ronnell Freshwater, NP  Multiple Vitamin (MULTIVITAMIN) capsule Take 1 capsule by mouth daily.   Yes [provider]  omeprazole (PRILOSEC) 40 MG capsule Take 1 capsule (40 mg total) by mouth daily. 08/22/17  Yes Boscia, Heather E, NP  potassium chloride (K-DUR) 10 MEQ tablet Take 1 tablet (10 mEq total) by mouth daily. 11/23/17  Yes Minna Merritts, MD  warfarin (COUMADIN) 2.5 MG tablet Take 1 tablet (2.5 mg total) by mouth daily. As directed by the coumadin clinic. 11/08/17 01/06/18 Yes Theora Gianotti, NP    Review of Systems    Palpitations with chest tightness as outlined above.  She denies dyspnea, PND, orthopnea, dizziness, syncope, edema, or early satiety.  All other systems reviewed and are otherwise negative except as noted above.  Physical Exam    VS:  BP 140/72 (BP Location: Left Arm, Patient Position: Sitting, Cuff Size: Normal)   Pulse 85   Ht '5\' 4"'  (1.626 m)   Wt 235 lb 12 oz (106.9 kg)   BMI 40.47 kg/m  , BMI Body mass index is 40.47 kg/m. GEN: Well nourished, well developed, in no acute distress. HEENT: normal. Neck: Supple, no JVD, carotid bruits, or masses. Cardiac: RRR, no murmurs, rubs, or gallops. No clubbing, cyanosis, edema.  Radials/PT  2+ and equal bilaterally.  Respiratory:  Respirations regular and unlabored, clear to auscultation bilaterally. GI: Soft, nontender, nondistended, BS + x 4. MS: no deformity or atrophy. Skin: warm and dry, no rash. Neuro:  Strength and sensation are intact. Psych: Normal affect.  Accessory Clinical Findings    ECG personally reviewed by me today -regular sinus rhythm, 85, left atrial enlargement- no acute changes.  Assessment & Plan    1.  Paroxysmal atrial fibrillation/palpitations: Patient previously diagnosed with A. fib in August and underwent TEE and cardioversion.  She has had intermittent palpitations but yesterday had more prolonged palpitations associated with chest tightness.  This awoke her from sleep.  She was seen by EMS but was apparently in sinus rhythm.  She noted intermittent palpitations throughout the day yesterday.  None today.  I will check a basic metabolic panel, magnesium, and TSH.  I will also place a ZIO monitor for 2 weeks to link symptoms to rhythm.  I am going to increase her diltiazem to 240 mg daily.  2.  Chest  tightness: She has had this intermittently, along with more atypical right arm and shoulder discomfort.  She did have chest tightness in the setting of tachypalpitations yesterday.  Placing 0 monitor as above.  I will also arrange for cardiac CT angiography to rule out obstructive coronary artery disease.  Labs today.  3.  Essential hypertension: Blood pressure elevated today.  Adjusting diltiazem dose.  4.  Tobacco abuse: Continues to smoke about a quarter of a pack a day.  Complete cessation advised.  She says that she and her husband are trying.  5.  Moderate mitral stenosis: Follow-up echo in the fall 2020.  6.  Disposition: Follow-up zi0 monitor, lab work, and cardiac CT angiography.  Follow-up in clinic in approximately 2 to 3 months or sooner if necessary.   Murray Hodgkins, NP 01/06/2018, 6:07 PM

## 2018-01-06 NOTE — Patient Instructions (Addendum)
Medication Instructions:  Your physician has recommended you make the following change in your medication:  1- INCREASE Diltiazem 1 tablet (240mg ) once daily.  If you need a refill on your cardiac medications before your next appointment, please call your pharmacy.   Lab work: Your physician recommends that you return for lab work today. (BMET, MAG, TSH)  If you have labs (blood work) drawn today and your tests are completely normal, you will receive your results only by: Marland Kitchen MyChart Message (if you have MyChart) OR . A paper copy in the mail If you have any lab test that is abnormal or we need to change your treatment, we will call you to review the results.  Testing/Procedures: 1-  [facility] AT: We will place order and you will receive it in the mail.  You may get a call from [employer] @ either  [phone] Or  [phone] for them to confirm your address before it will be sent to you.  You will wear the monitor for 14 days, remove it and send it back to the company. They will send [place] a report. Then we will call you with the results. 2- Please arrive at the [facility] main entrance of [facility] at xx:xx AM (30-45 minutes prior to test start time)  [facility] [place], [place] [address] [phone]  Proceed to the [facility] Radiology Department (First Floor).  Please follow these instructions carefully (unless otherwise directed):  Hold all erectile dysfunction medications at least 48 hours prior to test.  On the Night Before the Test: . Be sure to Drink plenty of water. . Do not consume any caffeinated/decaffeinated beverages or chocolate 12 hours prior to your test. . Do not take any antihistamines 12 hours prior to your test. . If you take Metformin do not take 24 hours prior to test.  On the Day of the Test: . Drink plenty of water. Do not drink any water within one hour of the test. . Do not eat any food 4 hours prior to the  test. . You may take your regular medications prior to the test, except Lasix.  . Take metoprolol two tablets (100mg  total) two hours prior to test. . HOLD Furosemide/Hydrochlorothiazide morning of the test.       After the Test: . Drink plenty of water. . After receiving IV contrast, you may experience a mild flushed feeling. This is normal. . On occasion, you may experience a mild rash up to 24 hours after the test. This is not dangerous. If this occurs, you can take Benadryl 25 mg and increase your fluid intake. . If you experience trouble breathing, this can be serious. If it is severe call 911 IMMEDIATELY. If it is mild, please call our office. . If you take any of these medications: Glipizide/Metformin, Avandament, Glucavance, please do not take 48 hours after completing test.   Follow-Up: At [facility], you and your health needs are our priority.  As part of our continuing mission to provide you with exceptional heart care, we have created designated Provider Care Teams.  These Care Teams include your primary Cardiologist (physician) and Advanced Practice Providers (APPs -  Physician Assistants and Nurse Practitioners) who all work together to provide you with the care you need, when you need it. You will need a follow up appointment in 2-4 months. You may see Ida Rogue, MD or one of the following Advanced Practice Providers on your designated Care Team:  Murray Hodgkins, NP Christell Faith, PA-C . Marrianne Mood, PA-C

## 2018-01-07 LAB — BASIC METABOLIC PANEL
BUN/Creatinine Ratio: 14 (ref 9–23)
BUN: 17 mg/dL (ref 6–24)
CO2: 27 mmol/L (ref 20–29)
Calcium: 9.7 mg/dL (ref 8.7–10.2)
Chloride: 100 mmol/L (ref 96–106)
Creatinine, Ser: 1.24 mg/dL — ABNORMAL HIGH (ref 0.57–1.00)
GFR calc Af Amer: 58 mL/min/{1.73_m2} — ABNORMAL LOW (ref >59)
GFR calc non Af Amer: 50 mL/min/{1.73_m2} — ABNORMAL LOW (ref >59)
Glucose: 125 mg/dL — ABNORMAL HIGH (ref 65–99)
Potassium: 3.3 mmol/L — ABNORMAL LOW (ref 3.5–5.2)
Sodium: 145 mmol/L — ABNORMAL HIGH (ref 134–144)

## 2018-01-07 LAB — TSH: TSH: 0.284 u[IU]/mL — ABNORMAL LOW (ref 0.450–4.500)

## 2018-01-07 LAB — MAGNESIUM: Magnesium: 1.9 mg/dL (ref 1.6–2.3)

## 2018-01-09 ENCOUNTER — Ambulatory Visit (INDEPENDENT_AMBULATORY_CARE_PROVIDER_SITE_OTHER): Payer: BLUE CROSS/BLUE SHIELD | Admitting: Nurse Practitioner

## 2018-01-09 ENCOUNTER — Encounter: Payer: Self-pay | Admitting: Nurse Practitioner

## 2018-01-09 ENCOUNTER — Telehealth: Payer: Self-pay | Admitting: *Deleted

## 2018-01-09 VITALS — BP 134/68 | HR 67 | Resp 16 | Ht 64.0 in | Wt 237.2 lb

## 2018-01-09 DIAGNOSIS — J0141 Acute recurrent pansinusitis: Secondary | ICD-10-CM | POA: Diagnosis not present

## 2018-01-09 DIAGNOSIS — B379 Candidiasis, unspecified: Secondary | ICD-10-CM

## 2018-01-09 DIAGNOSIS — F419 Anxiety disorder, unspecified: Secondary | ICD-10-CM | POA: Diagnosis not present

## 2018-01-09 DIAGNOSIS — R300 Dysuria: Secondary | ICD-10-CM

## 2018-01-09 DIAGNOSIS — I4891 Unspecified atrial fibrillation: Secondary | ICD-10-CM

## 2018-01-09 DIAGNOSIS — R7989 Other specified abnormal findings of blood chemistry: Secondary | ICD-10-CM

## 2018-01-09 DIAGNOSIS — E876 Hypokalemia: Secondary | ICD-10-CM

## 2018-01-09 DIAGNOSIS — M179 Osteoarthritis of knee, unspecified: Secondary | ICD-10-CM | POA: Insufficient documentation

## 2018-01-09 DIAGNOSIS — M1710 Unilateral primary osteoarthritis, unspecified knee: Secondary | ICD-10-CM | POA: Insufficient documentation

## 2018-01-09 LAB — POCT URINALYSIS DIPSTICK
Bilirubin, UA: NEGATIVE
Blood, UA: NEGATIVE
Glucose, UA: NEGATIVE
Ketones, UA: NEGATIVE
Leukocytes, UA: NEGATIVE
Nitrite, UA: NEGATIVE
Protein, UA: POSITIVE — AB
Spec Grav, UA: 1.01 (ref 1.010–1.025)
Urobilinogen, UA: 0.2 E.U./dL
pH, UA: 6.5 (ref 5.0–8.0)

## 2018-01-09 MED ORDER — FLUCONAZOLE 150 MG PO TABS: ORAL_TABLET | ORAL | 2 refills | Status: DC

## 2018-01-09 MED ORDER — LEVOFLOXACIN 500 MG PO TABS: 500.0000 mg | ORAL_TABLET | Freq: Every day | ORAL | 0 refills | Status: DC

## 2018-01-09 MED ORDER — CLARITHROMYCIN 500 MG PO TABS: 500.0000 mg | ORAL_TABLET | Freq: Two times a day (BID) | ORAL | 0 refills | Status: DC

## 2018-01-09 MED ORDER — FUROSEMIDE 20 MG PO TABS: 20.0000 mg | ORAL_TABLET | Freq: Every day | ORAL | 5 refills | Status: DC | PRN

## 2018-01-09 MED ORDER — ALPRAZOLAM 0.5 MG PO TABS: 0.5000 mg | ORAL_TABLET | Freq: Every evening | ORAL | 1 refills | Status: DC | PRN

## 2018-01-09 NOTE — Telephone Encounter (Signed)
-----   Message from Theora Gianotti, NP sent at 01/09/2018  8:06 AM EST ----- Kidneys look a little dry and potassium is low.  Increase oral hydration and reduce lasix to 20mg  daily prn wt gain of 2 lbs in 24 hrs or 5 lbs over a week.  She is currently on KCl 59meq daily.  She should take 4 tabs today.  Mg ok.  TSH is low, possibly suggesting hyperactivity of her thyroid, which could contribute to palpitations.  F/u STAT bmet, tsh, and FT4 on either 12/26 or 12/27.

## 2018-01-09 NOTE — Telephone Encounter (Signed)
Results called to pt. Pt verbalized understanding of the recommendations as listed as well. She is aware to go to the [facility] on 12/27 or 12/28 for repeat lab work. Med list updated. Lab orders entered.

## 2018-01-09 NOTE — Progress Notes (Signed)
[facility] [place], [place] [address]  Internal MEDICINE  Office Visit Note  Patient Name: Leslie [name]  [id number]  [id number]  Date of Service: 01/13/2018   Pt is here for a sick visit.  Chief Complaint  Patient presents with  . Wheezing  . Sore Throat    scratchy  . Cough  . Nasal Congestion    nasal and chest congestion, been going on for over a week  . Chills    some chills, no temperature noticed, pt grandbaby and daughter had pneumonia along with coworkers that have been sick  . Back Pain    lower back pain for about two weeks     The patient is here for sick visit. Has congestion in her chest and sinuses. Has been going on for a few weeks. Has been treating with OTC medication. Symptoms continue to get worse. She also reports moderate mid and lower back pain. Seems to radiate up the left flank. Worse with bending and twisting at the waist. She has concern that she may also have uti. She knows she has some degenerative disc disease and this may also be contributing to the back pain. She denies nausea, vomiting, or diarrhea. She denies fever, chills, or body aches.        Current Medication:  Outpatient Encounter Medications as of 01/09/2018  Medication Sig  . albuterol (PROVENTIL HFA;VENTOLIN HFA) 108 (90 BASE) MCG/ACT inhaler Inhale 2 puffs into the lungs every 6 (six) hours as needed for wheezing or shortness of breath.  . ALPRAZolam (XANAX) 0.5 MG tablet Take 1 tablet (0.5 mg total) by mouth at bedtime as needed for anxiety.  Marland Kitchen atorvastatin (LIPITOR) 20 MG tablet Take 1 tablet (20 mg total) by mouth daily at 6 PM.  . cetirizine (ZYRTEC) 10 MG tablet Take 1 tablet (10 mg total) by mouth daily.  Marland Kitchen diltiazem (CARDIZEM CD) 240 MG 24 hr capsule Take 1 capsule (240 mg total) by mouth daily.  Marland Kitchen estradiol (ESTRACE) 0.5 MG tablet Take 1 tablet (0.5 mg total) by mouth daily.  . fluconazole (DIFLUCAN) 150 MG tablet Take 1 tablet po once. May  repeat dose in three days for persistent symptoms.  . fluticasone (FLONASE) 50 MCG/ACT nasal spray Place 2 sprays into both nostrils daily as needed for allergies or rhinitis.  . furosemide (LASIX) 20 MG tablet Take 1 tablet (20 mg total) by mouth daily as needed. For weight gain greater than 2 lb in 24 hours and 5 lb in a week.  . metoprolol succinate (TOPROL-XL) 50 MG 24 hr tablet Take 1 tablet (50 mg total) by mouth daily. Take with or immediately following a meal.  . montelukast (SINGULAIR) 10 MG tablet Take 1 tablet (10 mg total) by mouth at bedtime.  . Multiple Vitamin (MULTIVITAMIN) capsule Take 1 capsule by mouth daily.  Marland Kitchen omeprazole (PRILOSEC) 40 MG capsule Take 1 capsule (40 mg total) by mouth daily.  . potassium chloride (K-DUR) 10 MEQ tablet Take 1 tablet (10 mEq total) by mouth daily.  . [DISCONTINUED] ALPRAZolam (XANAX) 0.5 MG tablet Take 1 tablet (0.5 mg total) by mouth at bedtime as needed for anxiety.  . [DISCONTINUED] fluconazole (DIFLUCAN) 150 MG tablet Take 1 tablet po once. May repeat dose in three days for persistent symptoms.  Marland Kitchen levofloxacin (LEVAQUIN) 500 MG tablet Take 1 tablet (500 mg total) by mouth daily.  Marland Kitchen warfarin (COUMADIN) 2.5 MG tablet Take 1 tablet (2.5 mg total) by mouth daily. As  directed by the coumadin clinic.  . [DISCONTINUED] clarithromycin (BIAXIN) 500 MG tablet Take 1 tablet (500 mg total) by mouth 2 (two) times daily.   No facility-administered encounter medications on file as of 01/09/2018.       Medical History: Past Medical History:  Diagnosis Date  . (HFpEF) heart failure with preserved ejection fraction ([place])    a. 08/2017 Echo: EF 55-60%.  Grade 2 diastolic dysfunction.  Moderate mitral stenosis.  . Allergy   . Anemia   . Anxiety   . BRCA negative 03/22/2013  . Bronchitis 02/19/2016   ON LEVAQUIN PO  . Chest tightness   . Cigarette smoker 09/11/2017   8-10 day  . GERD (gastroesophageal reflux disease)   . History of kidney stones   .  Hyperlipidemia   . Hypertension   . Moderate mitral stenosis    a.  08/2017 TEE: EF 60 to 65%.  Moderate mitral stenosis.  Mean gradient 14 mmHg.  Valve area 2.59 cm by planimetry, 2.72 cm by pressure half-time.  . Palpitations   . Persistent atrial fibrillation    a. 08/2017 s/p TEE/DCCV; b. CHA2DS2VASc = 2-->warfarin.     Vital Signs: BP 134/68 (BP Location: Right Arm, Patient Position: Sitting, Cuff Size: Large)   Pulse 67   Resp 16   Ht '5\' 4"'  (1.626 m)   Wt 237 lb 3.2 oz (107.6 kg)   SpO2 94%   BMI 40.72 kg/m    Review of Systems  Constitutional: Positive for fatigue. Negative for activity change, chills and unexpected weight change.  HENT: Positive for congestion, ear pain, postnasal drip, rhinorrhea, sinus pressure, sore throat and voice change. Negative for sneezing.   Respiratory: Positive for cough and wheezing. Negative for chest tightness and shortness of breath.   Cardiovascular: Positive for palpitations. Negative for chest pain.  Gastrointestinal: Negative for abdominal pain, constipation, diarrhea, nausea and vomiting.  Endocrine: Negative for cold intolerance, heat intolerance, polydipsia and polyuria.  Genitourinary: Positive for flank pain. Negative for dysuria, frequency, hematuria and urgency.  Musculoskeletal: Positive for back pain. Negative for arthralgias, joint swelling and neck pain.  Skin: Negative for rash.  Allergic/Immunologic: Positive for environmental allergies.  Neurological: Positive for headaches. Negative for tremors and numbness.  Hematological: Negative for adenopathy. Does not bruise/bleed easily.  Psychiatric/Behavioral: Negative for behavioral problems (Depression), sleep disturbance and suicidal ideas. The patient is not nervous/anxious.     Physical Exam Vitals signs and nursing note reviewed.  Constitutional:      General: She is not in acute distress.    Appearance: Normal appearance. She is well-developed. She is ill-appearing.  She is not diaphoretic.  HENT:     Head: Normocephalic and atraumatic.     Right Ear: Tympanic membrane is erythematous and bulging.     Left Ear: Tympanic membrane is erythematous and bulging.     Nose: Congestion and rhinorrhea present.     Right Sinus: Frontal sinus tenderness present.     Left Sinus: Frontal sinus tenderness present.     Mouth/Throat:     Pharynx: Pharyngeal swelling and posterior oropharyngeal erythema present. No oropharyngeal exudate.  Eyes:     Pupils: Pupils are equal, round, and reactive to light.  Neck:     Musculoskeletal: Normal range of motion and neck supple.     Thyroid: No thyromegaly.     Vascular: Carotid bruit present. No JVD.     Trachea: No tracheal deviation.  Cardiovascular:     Rate and Rhythm: Normal  rate. Rhythm irregular.     Heart sounds: Normal heart sounds. No murmur. No friction rub. No gallop.   Pulmonary:     Effort: Pulmonary effort is normal. No respiratory distress.     Breath sounds: Normal breath sounds. No wheezing or rales.  Chest:     Chest wall: No tenderness.  Abdominal:     General: Bowel sounds are normal.     Palpations: Abdomen is soft.     Tenderness: There is no abdominal tenderness.  Genitourinary:    Comments: Urine sample positive for protein only.  Musculoskeletal: Normal range of motion.     Comments: Moderate lower back pain, especially along the left mid back region. Pain is worse with movements, most significantly twisting left and right.   Lymphadenopathy:     Cervical: No cervical adenopathy.  Skin:    General: Skin is warm and dry.  Neurological:     Mental Status: She is alert and oriented to person, place, and time.     Cranial Nerves: No cranial nerve deficit.  Psychiatric:        Attention and Perception: Attention and perception normal.        Mood and Affect: Affect normal. Mood is anxious.        Speech: Speech normal.        Behavior: Behavior normal.        Thought Content: Thought  content normal.        Cognition and Memory: Cognition and memory normal.        Judgment: Judgment normal.   Assessment/Plan: 1. Acute recurrent pansinusitis Start levofloxacin 517m daily for 10 days. Rest and increase fluids. Recommend use of coricidin HBP or mucinex to improve symptoms.  - levofloxacin (LEVAQUIN) 500 MG tablet; Take 1 tablet (500 mg total) by mouth daily.  Dispense: 10 tablet; Refill: 0  2. Candidiasis Diflucan prescribed in case yeast infection develops. Take as needed and as prescribed  - fluconazole (DIFLUCAN) 150 MG tablet; Take 1 tablet po once. May repeat dose in three days for persistent symptoms.  Dispense: 3 tablet; Refill: 2  3. Dysuria - POCT Urinalysis Dipstick positive for protein only. Will monitor.   4. Anxiety May continue alprazolam 0.559mat bedtime as needed. A ninety day prescription was sent to her mail pharmacy.  - ALPRAZolam (XDuanne Moron0.5 MG tablet; Take 1 tablet (0.5 mg total) by mouth at bedtime as needed for anxiety.  Dispense: 90 tablet; Refill: 1  5. Atrial fibrillation, unspecified type ([place] war[place]. Follow up with cardiology and coumadin clinic as scheduled.   General Counseling: Tiera verbalizes understanding of the findings of todays visit and agrees with plan of treatment. I have discussed any further diagnostic evaluation that may be needed or ordered today. We also reviewed her medications today. she has been encouraged to call the office with any questions or concerns that should arise related to todays visit.    Counseling:  Rest and increase fluids. Continue using OTC medication to control symptoms.   This patient was seen by HeLeretha PolNP Collaboration with Dr FoLavera Guises a part of collaborative care agreement  Orders Placed This Encounter  Procedures  . POCT Urinalysis Dipstick    Meds ordered this encounter  Medications  . DISCONTD: clarithromycin (BIAXIN) 500 MG tablet    Sig: Take 1 tablet  (500 mg total) by mouth 2 (two) times daily.    Dispense:  20 tablet    Refill:  0  Order Specific Question:   Supervising Provider    Answer:   Lavera Guise [[id number]]  . levofloxacin (LEVAQUIN) 500 MG tablet    Sig: Take 1 tablet (500 mg total) by mouth daily.    Dispense:  10 tablet    Refill:  0    Please disregard prescription for biaxin. Thanks.    Order Specific Question:   Supervising Provider    Answer:   Lavera Guise [[id number]]  . fluconazole (DIFLUCAN) 150 MG tablet    Sig: Take 1 tablet po once. May repeat dose in three days for persistent symptoms.    Dispense:  3 tablet    Refill:  2    Order Specific Question:   Supervising Provider    Answer:   Lavera Guise [[id number]]  . ALPRAZolam (XANAX) 0.5 MG tablet    Sig: Take 1 tablet (0.5 mg total) by mouth at bedtime as needed for anxiety.    Dispense:  90 tablet    Refill:  1    Order Specific Question:   Supervising Provider    Answer:   Lavera Guise [[id number]]    Time spent: 25 Minutes

## 2018-01-12 ENCOUNTER — Other Ambulatory Visit: Payer: Self-pay

## 2018-01-13 ENCOUNTER — Other Ambulatory Visit
Admission: RE | Admit: 2018-01-13 | Discharge: 2018-01-13 | Disposition: A | Discharge status: Home / Self Care | Payer: BLUE CROSS/BLUE SHIELD | Source: Ambulatory Visit | Attending: Nurse Practitioner | Admitting: Nurse Practitioner

## 2018-01-13 ENCOUNTER — Telehealth: Payer: Self-pay

## 2018-01-13 DIAGNOSIS — R7989 Other specified abnormal findings of blood chemistry: Secondary | ICD-10-CM | POA: Insufficient documentation

## 2018-01-13 DIAGNOSIS — J0141 Acute recurrent pansinusitis: Secondary | ICD-10-CM | POA: Insufficient documentation

## 2018-01-13 DIAGNOSIS — F419 Anxiety disorder, unspecified: Secondary | ICD-10-CM | POA: Insufficient documentation

## 2018-01-13 DIAGNOSIS — B379 Candidiasis, unspecified: Secondary | ICD-10-CM | POA: Insufficient documentation

## 2018-01-13 DIAGNOSIS — E876 Hypokalemia: Secondary | ICD-10-CM

## 2018-01-13 LAB — BASIC METABOLIC PANEL
Anion gap: 8 (ref 5–15)
BUN: 15 mg/dL (ref 6–20)
CO2: 28 mmol/L (ref 22–32)
Calcium: 9.1 mg/dL (ref 8.9–10.3)
Chloride: 102 mmol/L (ref 98–111)
Creatinine, Ser: 1.06 mg/dL — ABNORMAL HIGH (ref 0.44–1.00)
GFR calc Af Amer: >60 mL/min (ref >60)
GFR calc non Af Amer: >60 mL/min (ref >60)
Glucose, Bld: 103 mg/dL — ABNORMAL HIGH (ref 70–99)
Potassium: 3.7 mmol/L (ref 3.5–5.1)
Sodium: 138 mmol/L (ref 135–145)

## 2018-01-13 LAB — TSH: TSH: 0.411 u[IU]/mL (ref 0.350–4.500)

## 2018-01-13 LAB — T4, FREE: Free T4: 1.09 ng/dL (ref 0.82–1.77)

## 2018-01-13 NOTE — Telephone Encounter (Signed)
Results and recommendations reviewed with patient and she verbalized understanding with no further questions at this time.  

## 2018-01-13 NOTE — Telephone Encounter (Signed)
Call to patient, no answer, no VM.

## 2018-01-13 NOTE — Telephone Encounter (Signed)
-----   Message from Theora Gianotti, NP sent at 01/13/2018  2:32 PM EST ----- TSH and FT4 are normal. Kidney fxn improved.  Potassium now nl. Cont current regimen.

## 2018-01-13 NOTE — Telephone Encounter (Signed)
Patient calling to discuss recent testing results  ° °Please call  ° °

## 2018-01-16 ENCOUNTER — Telehealth: Payer: Self-pay | Admitting: Cardiovascular Disease

## 2018-01-16 NOTE — Telephone Encounter (Signed)
Received call from [employer] to discuss that pt has not placed zio at this time.   I called pt and she reported she had received zio patch but had not placed yet d/t company being in town and was going to place it tonight.   I called back [employer] and reported that she would be placing it tonight.

## 2018-01-16 NOTE — Telephone Encounter (Signed)
°  1. Is this related to a heart monitor you are wearing?  No    2. What is your issue??   Per [employer] patient received monitor but has not applied yet.  They have been unable to reach the patient for courtesy call    Please route to covering RN/CMA/RMA for results. Route to monitor technicians or your monitor tech representative for your site for any technical concerns

## 2018-01-17 ENCOUNTER — Emergency Department: Payer: BLUE CROSS/BLUE SHIELD

## 2018-01-17 ENCOUNTER — Emergency Department
Admission: EM | Admit: 2018-01-17 | Discharge: 2018-01-17 | Disposition: A | Discharge status: Home / Self Care | Payer: BLUE CROSS/BLUE SHIELD | Attending: Emergency Medicine | Admitting: Emergency Medicine

## 2018-01-17 ENCOUNTER — Telehealth: Payer: Self-pay

## 2018-01-17 ENCOUNTER — Other Ambulatory Visit: Payer: Self-pay

## 2018-01-17 ENCOUNTER — Encounter: Payer: Self-pay | Admitting: *Deleted

## 2018-01-17 ENCOUNTER — Other Ambulatory Visit: Payer: Self-pay | Admitting: Nurse Practitioner

## 2018-01-17 DIAGNOSIS — R0602 Shortness of breath: Secondary | ICD-10-CM | POA: Diagnosis not present

## 2018-01-17 DIAGNOSIS — F17200 Nicotine dependence, unspecified, uncomplicated: Secondary | ICD-10-CM

## 2018-01-17 DIAGNOSIS — R52 Pain, unspecified: Secondary | ICD-10-CM | POA: Diagnosis not present

## 2018-01-17 DIAGNOSIS — Z96652 Presence of left artificial knee joint: Secondary | ICD-10-CM | POA: Diagnosis not present

## 2018-01-17 DIAGNOSIS — I5030 Unspecified diastolic (congestive) heart failure: Secondary | ICD-10-CM | POA: Insufficient documentation

## 2018-01-17 DIAGNOSIS — R002 Palpitations: Secondary | ICD-10-CM

## 2018-01-17 DIAGNOSIS — I110 Hypertensive heart disease with heart failure: Secondary | ICD-10-CM | POA: Insufficient documentation

## 2018-01-17 DIAGNOSIS — I4891 Unspecified atrial fibrillation: Secondary | ICD-10-CM | POA: Diagnosis not present

## 2018-01-17 DIAGNOSIS — R0789 Other chest pain: Secondary | ICD-10-CM | POA: Diagnosis not present

## 2018-01-17 DIAGNOSIS — Z79899 Other long term (current) drug therapy: Secondary | ICD-10-CM | POA: Diagnosis not present

## 2018-01-17 DIAGNOSIS — F17210 Nicotine dependence, cigarettes, uncomplicated: Secondary | ICD-10-CM | POA: Diagnosis not present

## 2018-01-17 DIAGNOSIS — R079 Chest pain, unspecified: Secondary | ICD-10-CM | POA: Diagnosis not present

## 2018-01-17 LAB — BASIC METABOLIC PANEL
Anion gap: 9 (ref 5–15)
BUN: 17 mg/dL (ref 6–20)
CO2: 25 mmol/L (ref 22–32)
Calcium: 8.8 mg/dL — ABNORMAL LOW (ref 8.9–10.3)
Chloride: 100 mmol/L (ref 98–111)
Creatinine, Ser: 1 mg/dL (ref 0.44–1.00)
GFR calc Af Amer: >60 mL/min (ref >60)
GFR calc non Af Amer: >60 mL/min (ref >60)
Glucose, Bld: 131 mg/dL — ABNORMAL HIGH (ref 70–99)
Potassium: 2.8 mmol/L — ABNORMAL LOW (ref 3.5–5.1)
Sodium: 134 mmol/L — ABNORMAL LOW (ref 135–145)

## 2018-01-17 LAB — CBC
HCT: 38.5 % (ref 36.0–46.0)
Hemoglobin: 12.6 g/dL (ref 12.0–15.0)
MCH: 27.6 pg (ref 26.0–34.0)
MCHC: 32.7 g/dL (ref 30.0–36.0)
MCV: 84.4 fL (ref 80.0–100.0)
Platelets: 397 10*3/uL (ref 150–400)
RBC: 4.56 MIL/uL (ref 3.87–5.11)
RDW: 14.4 % (ref 11.5–15.5)
WBC: 7.6 10*3/uL (ref 4.0–10.5)
nRBC: 0 % (ref 0.0–0.2)

## 2018-01-17 LAB — TROPONIN I: Troponin I: <0.03 ng/mL (ref <0.03)

## 2018-01-17 LAB — PROTIME-INR
INR: 2.63
Prothrombin Time: 27.7 seconds — ABNORMAL HIGH (ref 11.4–15.2)

## 2018-01-17 MED ORDER — NICOTINE 21 MG/24HR TD PT24: 21.0000 mg | MEDICATED_PATCH | Freq: Every day | TRANSDERMAL | 1 refills | Status: DC

## 2018-01-17 NOTE — ED Notes (Signed)
Blue top sent to lab. 

## 2018-01-17 NOTE — ED Notes (Signed)
Pt leaving for CXR.  

## 2018-01-17 NOTE — Progress Notes (Signed)
Sent prescription for nicoderm CQ 21mg . Patch to [facility]. sho should apply one patch every 24 hours then replace. Will discuss stepping down at next visit.

## 2018-01-17 NOTE — Telephone Encounter (Signed)
Spoke with [employer] regarding monitor and that patient should have placed last night. Confirmed phone number information and they will reach out to patient to confirm information and placement of monitor.

## 2018-01-17 NOTE — ED Provider Notes (Signed)
[facility] Emergency Department Provider Note  Time seen: 7:47 PM  I have reviewed the triage vital signs and the nursing notes.   HISTORY  Chief Complaint Chest Pain and Atrial Fibrillation    HPI Leslie [name] is a 52 y.o. female with a past medical history of CHF, anxiety, gastric reflux, hypertension, hyperlipidemia, paroxysmal atrial fibrillation on warfarin, presents to the emergency department for palpitations.  According to the patient around 5 PM tonight she developed palpitations felt like her heart was racing consistent with prior episodes of atrial fibrillation.  Patient states in October she first went into A. fib and was admitted to the hospital.  Was discharged on warfarin.  Approximately 3 weeks ago she believes she might have gone into A. fib again, and then tonight around 5 PM.  Patient sees Dr. Ida Rogue of cardiology.  Was actually sent a Holter monitor yesterday that she is supposed to wear but she did not start wearing it yet.  Patient states upon EMS arrival she appears to have converted back to normal sinus rhythm.  Currently denies any symptoms at all.  States while her heart was racing she was feeling short of breath and feeling chest tightness.   Past Medical History:  Diagnosis Date  . (HFpEF) heart failure with preserved ejection fraction ([place])    a. 08/2017 Echo: EF 55-60%.  Grade 2 diastolic dysfunction.  Moderate mitral stenosis.  . Allergy   . Anemia   . Anxiety   . BRCA negative 03/22/2013  . Bronchitis 02/19/2016   ON LEVAQUIN PO  . Chest tightness   . Cigarette smoker 09/11/2017   8-10 day  . GERD (gastroesophageal reflux disease)   . History of kidney stones   . Hyperlipidemia   . Hypertension   . Moderate mitral stenosis    a.  08/2017 TEE: EF 60 to 65%.  Moderate mitral stenosis.  Mean gradient 14 mmHg.  Valve area 2.59 cm by planimetry, 2.72 cm by pressure half-time.  . Palpitations   . Persistent atrial  fibrillation    a. 08/2017 s/p TEE/DCCV; b. CHA2DS2VASc = 2-->warfarin.    Patient Active Problem List   Diagnosis Date Noted  . Acute recurrent pansinusitis 01/13/2018  . Candidiasis 01/13/2018  . Anxiety 01/13/2018  . Osteoarthritis of knee 01/09/2018  . Morbid obesity with body mass index (BMI) of 40.0 or higher ([place]) 11/13/2017  . Major depressive disorder 09/22/2017  . Mitral valve stenosis   . Atrial fibrillation ([place]) 09/11/2017  . Generalized abdominal pain 09/08/2017  . Episode of recurrent major depressive disorder ([place]) 09/08/2017  . Hypertension 06/06/2017  . Cough 06/06/2017  . Tobacco dependency 06/06/2017  . Urinary tract infection with hematuria 04/28/2017  . Acute upper respiratory infection 04/28/2017  . Flu-like symptoms 04/28/2017  . Dysuria 04/28/2017  . Vaginal candidiasis 04/28/2017  . Pain of left lower extremity 02/11/2016  . 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 Fibrocystic breast disease 03/23/2013  . Family history of breast cancer 07/20/2012    Past Surgical History:  Procedure Laterality Date  . ABDOMINAL HYSTERECTOMY     total  . ABDOMINAL HYSTERECTOMY    . BREAST BIOPSY Bilateral 2012  . BREAST BIOPSY Right 08-07-12   fibroadenomatous changes and columnar cells  . BREAST BIOPSY  02/03/2015   stereo byrnett  . CHOLECYSTECTOMY N/A 02/27/2016   Procedure: LAPAROSCOPIC CHOLECYSTECTOMY;  Surgeon: Christene Lye, MD;  Location: [facility] ORS;  Service: General;  Laterality: N/A;  .  JOINT REPLACEMENT Left   . KNEE CLOSED REDUCTION Left 04/15/2015   Procedure: CLOSED MANIPULATION KNEE;  Surgeon: Thornton Park, MD;  Location: [facility] ORS;  Service: Orthopedics;  Laterality: Left;  . KNEE SURGERY    . TEE WITHOUT CARDIOVERSION N/A 09/13/2017   Procedure: TRANSESOPHAGEAL ECHOCARDIOGRAM (TEE);  Surgeon: Nelva Bush, MD;  Location: [facility] ORS;  Service: Cardiovascular;  Laterality: N/A;  . TOTAL KNEE ARTHROPLASTY Left 12/25/2014    Procedure: TOTAL KNEE ARTHROPLASTY;  Surgeon: Thornton Park, MD;  Location: [facility] ORS;  Service: Orthopedics;  Laterality: Left;  . TUBAL LIGATION      Prior to Admission medications   Medication Sig Start Date End Date Taking? Authorizing Provider  albuterol (PROVENTIL HFA;VENTOLIN HFA) 108 (90 BASE) MCG/ACT inhaler Inhale 2 puffs into the lungs every 6 (six) hours as needed for wheezing or shortness of breath.    [provider]  ALPRAZolam Duanne Moron) 0.5 MG tablet Take 1 tablet (0.5 mg total) by mouth at bedtime as needed for anxiety. 01/09/18   Ronnell Freshwater, NP  atorvastatin (LIPITOR) 20 MG tablet Take 1 tablet (20 mg total) by mouth daily at 6 PM. 08/22/17   Ronnell Freshwater, NP  cetirizine (ZYRTEC) 10 MG tablet Take 1 tablet (10 mg total) by mouth daily. 07/14/17   Ronnell Freshwater, NP  diltiazem (CARDIZEM CD) 240 MG 24 hr capsule Take 1 capsule (240 mg total) by mouth daily. 01/06/18   Theora Gianotti, NP  estradiol (ESTRACE) 0.5 MG tablet Take 1 tablet (0.5 mg total) by mouth daily. 08/22/17   Ronnell Freshwater, NP  fluconazole (DIFLUCAN) 150 MG tablet Take 1 tablet po once. May repeat dose in three days for persistent symptoms. 01/09/18   Ronnell Freshwater, NP  fluticasone (FLONASE) 50 MCG/ACT nasal spray Place 2 sprays into both nostrils daily as needed for allergies or rhinitis.    [provider]  furosemide (LASIX) 20 MG tablet Take 1 tablet (20 mg total) by mouth daily as needed. For weight gain greater than 2 lb in 24 hours and 5 lb in a week. 01/09/18   Theora Gianotti, NP  levofloxacin (LEVAQUIN) 500 MG tablet Take 1 tablet (500 mg total) by mouth daily. 01/09/18   Ronnell Freshwater, NP  metoprolol succinate (TOPROL-XL) 50 MG 24 hr tablet Take 1 tablet (50 mg total) by mouth daily. Take with or immediately following a meal. 11/08/17   Gollan, Kathlene November, MD  montelukast (SINGULAIR) 10 MG tablet Take 1 tablet (10 mg total) by mouth at bedtime.  08/22/17   Ronnell Freshwater, NP  Multiple Vitamin (MULTIVITAMIN) capsule Take 1 capsule by mouth daily.    [provider]  nicotine (NICODERM CQ) 21 mg/24hr patch Place 1 patch (21 mg total) onto the skin daily. 01/17/18   Ronnell Freshwater, NP  omeprazole (PRILOSEC) 40 MG capsule Take 1 capsule (40 mg total) by mouth daily. 08/22/17   Ronnell Freshwater, NP  potassium chloride (K-DUR) 10 MEQ tablet Take 1 tablet (10 mEq total) by mouth daily. 11/23/17   Minna Merritts, MD  warfarin (COUMADIN) 2.5 MG tablet Take 1 tablet (2.5 mg total) by mouth daily. As directed by the coumadin clinic. 11/08/17 01/06/18  Theora Gianotti, NP    Allergies  Allergen Reactions  . Morphine Hives and Rash  . Oxycodone Hcl Itching  . Dilaudid [Hydromorphone Hcl] Itching  . Morphine And Related Hives  . Oxycodone Itching and Rash    Family History  Problem Relation Age of Onset  . Cancer Mother 57       breast  . Cancer Maternal Aunt        breast  . Cancer Maternal Grandmother        breast    Social History Social History   Tobacco Use  . Smoking status: Current Some Day Smoker    Packs/day: 0.50    Years: 20.00    Pack years: 10.00    Types: Cigarettes  . Smokeless tobacco: Never Used  . Tobacco comment: down to 2-3 daily  Substance Use Topics  . Alcohol use: Not Currently    Alcohol/week: 2.0 standard drinks    Types: 2 Glasses of wine per week    Comment: wine occ  . Drug use: No    Review of Systems Constitutional: Negative for fever. Cardiovascular: Positive for chest tightness, now resolved.  Positive for rapid palpitations, now resolved. Respiratory: Positive for shortness of breath, now resolved Gastrointestinal: Negative for abdominal pain, vomiting Musculoskeletal: Negative for musculoskeletal complaints Skin: Negative for skin complaints  Neurological: Negative for headache All other ROS  negative  ____________________________________________   PHYSICAL EXAM:  VITAL SIGNS: ED Triage Vitals  Enc Vitals Group     BP 01/17/18 1920 (!) 177/97     Pulse Rate 01/17/18 1920 96     Resp 01/17/18 1920 16     Temp 01/17/18 1920 98.1 F (36.7 C)     Temp Source 01/17/18 1920 Oral     SpO2 01/17/18 1920 99 %     Weight 01/17/18 1921 237 lb 3.2 oz (107.6 kg)     Height 01/17/18 1921 _0  (1.626 m)     Head Circumference --      Peak Flow --      Pain Score 01/17/18 1921 3     Pain Loc --      Pain Edu? --      Excl. in [place]? --    Constitutional: Alert and oriented. Well appearing and in no distress. Eyes: Normal exam ENT   Head: Normocephalic and atraumatic.   Mouth/Throat: Mucous membranes are moist. Cardiovascular: Normal rate, regular rhythm. No murmur Respiratory: Normal respiratory effort without tachypnea nor retractions. Breath sounds are clear Gastrointestinal: Soft and nontender. No distention.  Musculoskeletal: Nontender with normal range of motion in all extremities. No lower extremity tenderness or edema. Neurologic:  Normal speech and language. No gross focal neurologic deficits  Skin:  Skin is warm, dry and intact.  Psychiatric: Mood and affect are normal.   ____________________________________________    EKG  EKG viewed and interpreted by myself shows a normal sinus rhythm at 89 bpm with a narrow QRS, normal axis, largely normal intervals, no concerning ST changes.  ____________________________________________    RADIOLOGY  Slight vascular congestion otherwise negative chest x-ray  ____________________________________________   INITIAL IMPRESSION / ASSESSMENT AND PLAN / ED COURSE  Pertinent labs & imaging results that were available during my care of the patient were reviewed by me and considered in my medical decision making (see chart for details).  Patient presents to the emergency department for rapid palpitations.  Patient has  a history of paroxysmal atrial fibrillation, possibly patient could have gone into A. fib, possibly SVT, versus other arrhythmia.  Patient appears to have spontaneously converted back to a normal sinus rhythm.  Remains in a normal sinus rhythm at this time.  We will check labs including cardiac enzymes, INR, chest x-ray and continue to closely monitor.  EKG is reassuring at this time.  Patient already sees cardiology, already has a Holter monitor to wear.  Patient should be protected from a stroke standpoint as long as her INR is therapeutic.  As long as the patient continues to appear well and remained symptom-free anticipate likely discharge home pending normal cardiac enzymes.  Patient's work-up is largely within normal limits.  Troponin is negative.  INR is therapeutic.  Chest x-ray shows slight vascular congestion otherwise normal.  Patient remains in normal sinus rhythm, asymptomatic in the emergency department.  We will discharge home with cardiology follow-up.  Patient agreeable to plan of care.  ____________________________________________   FINAL CLINICAL IMPRESSION(S) / ED DIAGNOSES  Palpitations Chest tightness   Harvest Dark, MD 01/17/18 2056

## 2018-01-17 NOTE — ED Triage Notes (Signed)
pt to ED via EMS reporting a fluttering in chest. Hx of Afib. HR with EMS was 150 but pt converted during IV start. PT rpeorting chest tightness on the left side but no other symptoms at this time.

## 2018-01-17 NOTE — Telephone Encounter (Signed)
Sent prescription for nicoderm CQ 21mg . Patch to [facility]. sho should apply one patch every 24 hours then replace. Will discuss stepping down at next visit.

## 2018-01-17 NOTE — ED Notes (Signed)
EKG completed and given to EDP Paduchowski in person.

## 2018-01-18 ENCOUNTER — Ambulatory Visit: Payer: BLUE CROSS/BLUE SHIELD | Admitting: Cardiovascular Disease

## 2018-01-18 ENCOUNTER — Ambulatory Visit (INDEPENDENT_AMBULATORY_CARE_PROVIDER_SITE_OTHER): Payer: BLUE CROSS/BLUE SHIELD

## 2018-01-18 DIAGNOSIS — I4891 Unspecified atrial fibrillation: Secondary | ICD-10-CM

## 2018-01-18 DIAGNOSIS — R002 Palpitations: Secondary | ICD-10-CM

## 2018-01-19 ENCOUNTER — Ambulatory Visit: Payer: Self-pay | Admitting: Nurse Practitioner

## 2018-01-19 ENCOUNTER — Telehealth: Payer: Self-pay

## 2018-01-19 NOTE — Telephone Encounter (Signed)
Pt advised we send nicoderm patch to [facility]ar

## 2018-01-20 ENCOUNTER — Other Ambulatory Visit: Payer: Self-pay

## 2018-01-20 DIAGNOSIS — M25561 Pain in right knee: Secondary | ICD-10-CM | POA: Diagnosis not present

## 2018-01-20 MED ORDER — ESTRADIOL 0.5 MG PO TABS: 0.5000 mg | ORAL_TABLET | Freq: Every day | ORAL | 1 refills | Status: DC

## 2018-01-20 NOTE — Telephone Encounter (Signed)
LMOM as pt is overdue and not appt scheduled.

## 2018-01-20 NOTE — Telephone Encounter (Signed)
Please review for refill. Pharmacy is requesting refill of warfarin 2.5 tabs 90 day supply [facility]

## 2018-01-23 ENCOUNTER — Other Ambulatory Visit: Payer: Self-pay | Admitting: *Deleted

## 2018-01-23 ENCOUNTER — Ambulatory Visit (INDEPENDENT_AMBULATORY_CARE_PROVIDER_SITE_OTHER): Payer: BLUE CROSS/BLUE SHIELD

## 2018-01-23 DIAGNOSIS — I050 Rheumatic mitral stenosis: Secondary | ICD-10-CM

## 2018-01-23 DIAGNOSIS — I4891 Unspecified atrial fibrillation: Secondary | ICD-10-CM

## 2018-01-23 DIAGNOSIS — Z5181 Encounter for therapeutic drug level monitoring: Secondary | ICD-10-CM | POA: Diagnosis not present

## 2018-01-23 LAB — POCT INR: INR: 4.3 — AB (ref 2.0–3.0)

## 2018-01-23 MED ORDER — WARFARIN SODIUM 2.5 MG PO TABS: 2.5000 mg | ORAL_TABLET | Freq: Every day | ORAL | 0 refills | Status: DC

## 2018-01-23 MED ORDER — POTASSIUM CHLORIDE ER 10 MEQ PO TBCR: 10.0000 meq | EXTENDED_RELEASE_TABLET | Freq: Every day | ORAL | 2 refills | Status: DC

## 2018-01-23 NOTE — Patient Instructions (Signed)
Please skip coumadin today, then START NEW DOSAGE of 1 tablet (2.5 mg) every day.  Recheck in 2 weeks.

## 2018-01-23 NOTE — Telephone Encounter (Signed)
Patient scheduled 1/6 at 4:30p

## 2018-01-23 NOTE — Telephone Encounter (Signed)
Please review for refill, Thanks !  

## 2018-01-24 ENCOUNTER — Telehealth: Payer: Self-pay | Admitting: Nurse Practitioner

## 2018-01-24 DIAGNOSIS — M25561 Pain in right knee: Secondary | ICD-10-CM | POA: Diagnosis not present

## 2018-01-24 DIAGNOSIS — G8929 Other chronic pain: Secondary | ICD-10-CM | POA: Diagnosis not present

## 2018-01-24 DIAGNOSIS — M2391 Unspecified internal derangement of right knee: Secondary | ICD-10-CM | POA: Diagnosis not present

## 2018-01-24 NOTE — Telephone Encounter (Signed)
Received call from [employer] stating that patients Zio AT monitor is indicating leads off. They reached out to patient and she states that it would not stick and mailed it back to the company. They state that the patient did not call regarding any problems for troubleshooting advice. Will watch for final report to upload and will make provider aware.

## 2018-01-25 ENCOUNTER — Other Ambulatory Visit: Payer: Self-pay

## 2018-01-25 ENCOUNTER — Other Ambulatory Visit: Payer: Self-pay | Admitting: Physician Assistant

## 2018-01-25 DIAGNOSIS — M25561 Pain in right knee: Principal | ICD-10-CM

## 2018-01-25 DIAGNOSIS — G8929 Other chronic pain: Secondary | ICD-10-CM

## 2018-01-25 MED ORDER — WARFARIN SODIUM 2.5 MG PO TABS: 2.5000 mg | ORAL_TABLET | Freq: Every day | ORAL | 0 refills | Status: DC

## 2018-01-27 ENCOUNTER — Other Ambulatory Visit: Payer: Self-pay | Admitting: *Deleted

## 2018-01-27 DIAGNOSIS — I4891 Unspecified atrial fibrillation: Secondary | ICD-10-CM

## 2018-01-27 NOTE — Progress Notes (Signed)
e

## 2018-01-31 ENCOUNTER — Other Ambulatory Visit: Payer: Self-pay | Admitting: Cardiovascular Disease

## 2018-01-31 ENCOUNTER — Ambulatory Visit: Payer: Self-pay | Admitting: General Surgery

## 2018-01-31 NOTE — Telephone Encounter (Signed)
Refill Request.  

## 2018-02-01 DIAGNOSIS — Z1231 Encounter for screening mammogram for malignant neoplasm of breast: Secondary | ICD-10-CM | POA: Diagnosis not present

## 2018-02-02 ENCOUNTER — Ambulatory Visit: Payer: BLUE CROSS/BLUE SHIELD | Admitting: Nurse Practitioner

## 2018-02-02 NOTE — Progress Notes (Signed)
Cardiology Office Note Date:  02/03/2018  Patient ID:  Leslie, [name] Aug 19, 1965, MRN [id number] PCP:  Ronnell Freshwater, NP  Cardiologist:  Dr. Rockey Situ, MD    Chief Complaint: Follow-up  History of Present Illness: Leslie [name] is a 53 y.o. female with history of PAF on Coumadin, chronic diastolic CHF, moderate mitral stenosis, hypertension, hyperlipidemia, tobacco abuse, obesity, sleep apnea, and anxiety who presents for follow-up of palpitations.  Patient's A. fib was diagnosed in 08/2016 in the setting of hospitalization for chest tightness.  She underwent TEE which showed moderate mitral stenosis.  She was subsequently cardioverted.  She has been anticoagulated on Coumadin in the setting of a valvular A. fib.  She was last seen in the office on 01/06/2018 noting she had been doing well up until 01/05/2018 when she awoke suddenly with tachypalpitations and heart rates in the 130s beats per minute.  EMS was called to her house, upon their arrival the patient was feeling somewhat better and was noted to be in sinus rhythm with heart rates in the 80s to 90s bpm.  She continued to note symptoms concerning for recurrence of A. fib prompting her to be seen on 12/20.  EKG at that time showed sinus rhythm.  Her diltiazem was increased to 240 mg daily.  In the setting of chest tightness associated with her palpitations cardiac CT was ordered, which is pending at this time.  Labs checked on 01/06/2018 showed a low TSH of 0.284, magnesium 1.9, potassium 3.3, serum creatinine 1.24.  Recheck BMP on 12/27 showed potassium 3.7, serum creatinine 1.06, TSH normal at 0.411, free T4 normal.    She was seen in the ED on 01/17/2018 with continued intermittent palpitations and chest pain.  It was noted she had not yet started her outpatient cardiac monitoring at that time.  Labs showed a troponin of less than 0.03, normal CBC, potassium 2.8, serum creatinine 1.00, INR 2.63.  Chest x-ray showed chronic  interstitial lung disease.  EKG showed sinus rhythm.  She was discharged to outpatient follow-up.  It is unclear from the note if the patient received repletion of her potassium in the ED.  Preliminary report of her outpatient cardiac monitoring showed no evidence of A. fib with an overall sinus rhythm with an average heart rate of 82 bpm, minimum heart rate 54 bpm, maximum heart rate 145 bpm.  There were 5 SVT runs with the fastest interval lasting 5 beats with a maximum rate of 145 bpm and the longest lasting 16 beats with an average rate of 114 bpm.  Episode of SVT was possible atrial tach with variable block.  Isolated PACs, atrial couplets, and atrial triplets were rare.  Isolated PVCs were rare.  Most recent INR on 01/23/2018 supratherapeutic at 4.3.  Patient comes in today having noted no further palpitations.  No chest pain or shortness of breath.  She reports compliance with medications.  Blood pressure typically runs in the 315V to 761Y systolic with heart rate typically in the 70s to 80s beats per minute.  She has been taking Lasix 20 mg daily on a scheduled basis with KCl 10 mEq daily.  She did suffer a mechanical fall approximately 3 weeks prior as she was coming down with the 3 steps into her garage without the light on and missed 1 step.  She landed on her left buttock and left lateral thigh.  Since then, she has noted a significant hematoma that is sore.  She did not  hit her head or suffer loss of consciousness.  No dizziness, presyncope, or syncope.  No lower extremity swelling, abdominal distention, orthopnea, PND, or early satiety.  Past Medical History:  Diagnosis Date  . (HFpEF) heart failure with preserved ejection fraction ([employer])    a. 08/2017 Echo: EF 55-60%.  Grade 2 diastolic dysfunction.  Moderate mitral stenosis.  . Allergy   . Anemia   . Anxiety   . BRCA negative 03/22/2013  . Bronchitis 02/19/2016   ON LEVAQUIN PO  . Chest tightness   . Cigarette smoker 09/11/2017   8-10  day  . GERD (gastroesophageal reflux disease)   . History of kidney stones   . Hyperlipidemia   . Hypertension   . Moderate mitral stenosis    a.  08/2017 TEE: EF 60 to 65%.  Moderate mitral stenosis.  Mean gradient 14 mmHg.  Valve area 2.59 cm by planimetry, 2.72 cm by pressure half-time.  . Palpitations   . Persistent atrial fibrillation    a. 08/2017 s/p TEE/DCCV; b. CHA2DS2VASc = 2-->warfarin.    Past Surgical History:  Procedure Laterality Date  . ABDOMINAL HYSTERECTOMY     total  . ABDOMINAL HYSTERECTOMY    . BREAST BIOPSY Bilateral 2012  . BREAST BIOPSY Right 08-07-12   fibroadenomatous changes and columnar cells  . BREAST BIOPSY  02/03/2015   stereo byrnett  . CHOLECYSTECTOMY N/A 02/27/2016   Procedure: LAPAROSCOPIC CHOLECYSTECTOMY;  Surgeon: Christene Lye, MD;  Location: [facility] ORS;  Service: General;  Laterality: N/A;  . JOINT REPLACEMENT Left   . KNEE CLOSED REDUCTION Left 04/15/2015   Procedure: CLOSED MANIPULATION KNEE;  Surgeon: Thornton Park, MD;  Location: [facility] ORS;  Service: Orthopedics;  Laterality: Left;  . KNEE SURGERY    . TEE WITHOUT CARDIOVERSION N/A 09/13/2017   Procedure: TRANSESOPHAGEAL ECHOCARDIOGRAM (TEE);  Surgeon: Nelva Bush, MD;  Location: [facility] ORS;  Service: Cardiovascular;  Laterality: N/A;  . TOTAL KNEE ARTHROPLASTY Left 12/25/2014   Procedure: TOTAL KNEE ARTHROPLASTY;  Surgeon: Thornton Park, MD;  Location: [facility] ORS;  Service: Orthopedics;  Laterality: Left;  . TUBAL LIGATION      Current Meds  Medication Sig  . albuterol (PROVENTIL HFA;VENTOLIN HFA) 108 (90 BASE) MCG/ACT inhaler Inhale 2 puffs into the lungs every 6 (six) hours as needed for wheezing or shortness of breath.  . ALPRAZolam (XANAX) 0.5 MG tablet Take 1 tablet (0.5 mg total) by mouth at bedtime as needed for anxiety.  Marland Kitchen atorvastatin (LIPITOR) 20 MG tablet Take 1 tablet (20 mg total) by mouth daily at 6 PM.  . diltiazem (CARDIZEM CD) 240 MG 24 hr capsule Take 1 capsule  (240 mg total) by mouth daily.  Marland Kitchen estradiol (ESTRACE) 0.5 MG tablet Take 1 tablet (0.5 mg total) by mouth daily.  . furosemide (LASIX) 20 MG tablet Take 1 tablet (20 mg total) by mouth daily as needed. For weight gain greater than 2 lb in 24 hours and 5 lb in a week.  . metoprolol succinate (TOPROL-XL) 50 MG 24 hr tablet Take 1 tablet (50 mg total) by mouth daily. Take with or immediately following a meal.  . montelukast (SINGULAIR) 10 MG tablet Take 1 tablet (10 mg total) by mouth at bedtime.  . Multiple Vitamin (MULTIVITAMIN) capsule Take 1 capsule by mouth daily.  Marland Kitchen omeprazole (PRILOSEC) 40 MG capsule Take 1 capsule (40 mg total) by mouth daily.  . potassium chloride (K-DUR) 10 MEQ tablet Take 1 tablet (10 mEq total) by mouth daily.  Marland Kitchen warfarin (  COUMADIN) 2.5 MG tablet Take 1 tablet (2.5 mg total) by mouth daily. As directed by the coumadin clinic.    Allergies:   Morphine; Oxycodone hcl; Dilaudid [hydromorphone hcl]; Morphine and related; and Oxycodone   Social History:  The patient  reports that she has been smoking cigarettes. She has a 10.00 pack-year smoking history. She has never used smokeless tobacco. She reports previous alcohol use of about 2.0 standard drinks of alcohol per week. She reports that she does not use drugs.   Family History:  The patient's family history includes Cancer in her maternal aunt and maternal grandmother; Cancer (age of onset: 58) in her mother.  ROS:   Review of Systems  Constitutional: Positive for malaise/fatigue. Negative for chills, diaphoresis, fever and weight loss.  HENT: Negative for congestion.   Eyes: Negative for discharge and redness.  Respiratory: Negative for cough, hemoptysis, sputum production, shortness of breath and wheezing.   Cardiovascular: Positive for palpitations. Negative for chest pain, orthopnea, claudication, leg swelling and PND.  Gastrointestinal: Negative for abdominal pain, blood in stool, heartburn, melena, nausea and  vomiting.  Genitourinary: Negative for hematuria.  Musculoskeletal: Negative for falls and myalgias.  Skin: Negative for rash.  Neurological: Negative for dizziness, tingling, tremors, sensory change, speech change, focal weakness, loss of consciousness and weakness.  Endo/Heme/Allergies: Bruises/bleeds easily.  Psychiatric/Behavioral: Negative for substance abuse. The patient is not nervous/anxious.   All other systems reviewed and are negative.    PHYSICAL EXAM:  VS:  BP 132/64 (BP Location: Left Arm, Patient Position: Sitting, Cuff Size: Large)   Pulse 76   Ht '5\' 4"'  (1.626 m)   Wt 242 lb 8 oz (110 kg)   BMI 41.63 kg/m  BMI: Body mass index is 41.63 kg/m.  Physical Exam  Constitutional: She is oriented to person, place, and time. She appears well-developed and well-nourished.  HENT:  Head: Normocephalic and atraumatic.  Eyes: Right eye exhibits no discharge. Left eye exhibits no discharge.  Neck: Normal range of motion. No JVD present.  Cardiovascular: Normal rate, regular rhythm, S1 normal and S2 normal. Exam reveals no distant heart sounds, no friction rub, no midsystolic click and no opening snap.  Murmur heard.  Low-pitched rumbling crescendo presystolic murmur is present with a grade of 2/6 at the apex. Pulses:      Posterior tibial pulses are 2+ on the right side and 2+ on the left side.  Pulmonary/Chest: Effort normal and breath sounds normal. No respiratory distress. She has no decreased breath sounds. She has no wheezes. She has no rales. She exhibits no tenderness.  Abdominal: Soft. She exhibits no distension. There is no abdominal tenderness.  Musculoskeletal:        General: No edema.  Neurological: She is alert and oriented to person, place, and time.  Skin: Skin is warm and dry. No cyanosis. Nails show no clubbing.     Psychiatric: She has a normal mood and affect. Her speech is normal and behavior is normal. Judgment and thought content normal.     EKG:  Was  ordered and interpreted by me today. Shows NSR, 76 bpm, possible left atrial enlargement, no acute ST-T changes  Recent Labs: 09/23/2017: ALT 36 01/06/2018: Magnesium 1.9 01/13/2018: TSH 0.411 01/17/2018: BUN 17; Creatinine, Ser 1.00; Hemoglobin 12.6; Platelets 397; Potassium 2.8; Sodium 134  09/23/2017: Chol/HDL Ratio 4.6; Cholesterol, Total 175; HDL 38; LDL Calculated 110; Triglycerides 136   Estimated Creatinine Clearance: 80.7 mL/min (by C-G formula based on SCr of 1 mg/dL).  Wt Readings from Last 3 Encounters:  02/03/18 242 lb 8 oz (110 kg)  01/17/18 237 lb 3.2 oz (107.6 kg)  01/09/18 237 lb 3.2 oz (107.6 kg)     Other studies reviewed: Additional studies/records reviewed today include: summarized above  ASSESSMENT AND PLAN:  1. PAF: Maintaining sinus rhythm. No evidence of Afib on recent Zio monitor. Increase Toprol to 75 mg daily. Continue Cardizem CD 240 mg daily. Continue Coumadin [facility] (has follow up on 02/06/2018 given recently elevated INR).  Check CBC.  2. SVT: Noted on recent Zio monitor. Likely in the setting of hypokalemia. Increase Toprol XL to 75 mg daily. Continue Cardizem CD 240 mg daily. Replete potassium as below.    3. Hypokalemia: Likely in the setting of daily Lasix usage. Check BMET. Replete potassium to goal of 4.0. Likely exacerbating her palpitations and atrial ectopy.   4. Leg hematoma: In the setting of a mechanical fall, falling down a step leading to her garage.  Schedule CT of the femur (spoke with radiologist). Continue warm compresses. Check CBC.   5. Chronic diastolic CHF: She does not appear grossly volume up. She has been taking Lasix 20 mg daily. Check BMET. CHF education.   6. Mitral stenosis: Follow up echo in fall of 2020.   7. Hypertension: Blood pressure is suboptimally controlled today. Increase Toprol XL as above. Continue Cardizem CD.  8. Hyperlipidemia: Most recent LDL of 110 from 09/2017 with mildly elevated ALT of 36 at  that time.  Remains on Lipitor.   9. Obesity/sleep apnea: Weight loss advised.   10. Tobacco abuse: Cessation advised.   Disposition: F/u with Dr. Rockey Situ or an APP in 2 weeks.   Current medicines are reviewed at length with the patient today.  The patient did not have any concerns regarding medicines.  Signed, Christell Faith, PA-C 02/03/2018 10:09 AM     [facility] [address] [place],  [address] [phone]

## 2018-02-03 ENCOUNTER — Ambulatory Visit
Admission: RE | Admit: 2018-02-03 | Discharge: 2018-02-03 | Disposition: A | Discharge status: Home / Self Care | Payer: BLUE CROSS/BLUE SHIELD | Source: Ambulatory Visit | Attending: Physician Assistant | Admitting: Physician Assistant

## 2018-02-03 ENCOUNTER — Telehealth: Payer: Self-pay | Admitting: *Deleted

## 2018-02-03 ENCOUNTER — Ambulatory Visit (INDEPENDENT_AMBULATORY_CARE_PROVIDER_SITE_OTHER): Payer: BLUE CROSS/BLUE SHIELD | Admitting: Physician Assistant

## 2018-02-03 ENCOUNTER — Other Ambulatory Visit
Admission: RE | Admit: 2018-02-03 | Discharge: 2018-02-03 | Disposition: A | Discharge status: Home / Self Care | Payer: BLUE CROSS/BLUE SHIELD | Source: Ambulatory Visit | Attending: Physician Assistant | Admitting: Physician Assistant

## 2018-02-03 ENCOUNTER — Encounter: Payer: Self-pay | Admitting: Physician Assistant

## 2018-02-03 VITALS — BP 132/64 | HR 76 | Ht 64.0 in | Wt 242.5 lb

## 2018-02-03 DIAGNOSIS — T148XXA Other injury of unspecified body region, initial encounter: Secondary | ICD-10-CM

## 2018-02-03 DIAGNOSIS — G8929 Other chronic pain: Secondary | ICD-10-CM | POA: Diagnosis not present

## 2018-02-03 DIAGNOSIS — I471 Supraventricular tachycardia: Secondary | ICD-10-CM | POA: Diagnosis not present

## 2018-02-03 DIAGNOSIS — I4891 Unspecified atrial fibrillation: Secondary | ICD-10-CM

## 2018-02-03 DIAGNOSIS — E876 Hypokalemia: Secondary | ICD-10-CM

## 2018-02-03 DIAGNOSIS — I480 Paroxysmal atrial fibrillation: Secondary | ICD-10-CM | POA: Diagnosis not present

## 2018-02-03 DIAGNOSIS — E782 Mixed hyperlipidemia: Secondary | ICD-10-CM

## 2018-02-03 DIAGNOSIS — M25561 Pain in right knee: Secondary | ICD-10-CM | POA: Diagnosis not present

## 2018-02-03 DIAGNOSIS — I10 Essential (primary) hypertension: Secondary | ICD-10-CM

## 2018-02-03 DIAGNOSIS — I5032 Chronic diastolic (congestive) heart failure: Secondary | ICD-10-CM

## 2018-02-03 DIAGNOSIS — R710 Precipitous drop in hematocrit: Secondary | ICD-10-CM

## 2018-02-03 DIAGNOSIS — I050 Rheumatic mitral stenosis: Secondary | ICD-10-CM

## 2018-02-03 LAB — BASIC METABOLIC PANEL
Anion gap: 9 (ref 5–15)
BUN: 22 mg/dL — ABNORMAL HIGH (ref 6–20)
CO2: 29 mmol/L (ref 22–32)
Calcium: 8.9 mg/dL (ref 8.9–10.3)
Chloride: 102 mmol/L (ref 98–111)
Creatinine, Ser: 1.16 mg/dL — ABNORMAL HIGH (ref 0.44–1.00)
GFR calc Af Amer: >60 mL/min (ref >60)
GFR calc non Af Amer: 54 mL/min — ABNORMAL LOW (ref >60)
Glucose, Bld: 89 mg/dL (ref 70–99)
Potassium: 3.5 mmol/L (ref 3.5–5.1)
Sodium: 140 mmol/L (ref 135–145)

## 2018-02-03 LAB — CBC
HCT: 32.9 % — ABNORMAL LOW (ref 36.0–46.0)
Hemoglobin: 10.4 g/dL — ABNORMAL LOW (ref 12.0–15.0)
MCH: 28.1 pg (ref 26.0–34.0)
MCHC: 31.6 g/dL (ref 30.0–36.0)
MCV: 88.9 fL (ref 80.0–100.0)
Platelets: 323 10*3/uL (ref 150–400)
RBC: 3.7 MIL/uL — ABNORMAL LOW (ref 3.87–5.11)
RDW: 15.9 % — ABNORMAL HIGH (ref 11.5–15.5)
WBC: 6.7 10*3/uL (ref 4.0–10.5)
nRBC: 0 % (ref 0.0–0.2)

## 2018-02-03 MED ORDER — METOPROLOL SUCCINATE ER 25 MG PO TB24: 75.0000 mg | ORAL_TABLET | Freq: Every day | ORAL | 11 refills | Status: DC

## 2018-02-03 NOTE — Patient Instructions (Addendum)
Medication Instructions:  Your physician has recommended you make the following change in your medication:  1- INCREASE Toprol to 3 tablets (75 mg total ) once daily   If you need a refill on your cardiac medications before your next appointment, please call your pharmacy.   Lab work: Your physician recommends that you return for lab work today (CBC, BMET)  If you have labs (blood work) drawn today and your tests are completely normal, you will receive your results only by: Marland Kitchen MyChart Message (if you have MyChart) OR . A paper copy in the mail If you have any lab test that is abnormal or we need to change your treatment, we will call you to review the results.  Testing/Procedures: 1- Non contrast CT of Femur  You are scheduled for CT on _______1/21/20____. Please arrive at [facility] at _____1:15pm.  [place], [place], [place], [place]. Please arrive by 1 pm on the day of your procedure.   PLEASE NOTIFY SCHEDULING AT LEAST 24 HOURS IN ADVANCE IF YOU ARE UNABLE TO KEEP YOUR APPOINTMENT. [phone]   Follow-Up: At [facility], you and your health needs are our priority.  As part of our continuing mission to provide you with exceptional heart care, we have created designated Provider Care Teams.  These Care Teams include your primary Cardiologist (physician) and Advanced Practice Providers (APPs -  Physician Assistants and Nurse Practitioners) who all work together to provide you with the care you need, when you need it. You will need a follow up appointment in 2 weeks.  You may see Ida Rogue, MD or one of the following Advanced Practice Providers on your designated Care Team:   Murray Hodgkins, NP Christell Faith, PA-C . Marrianne Mood, PA-C

## 2018-02-03 NOTE — Telephone Encounter (Signed)
-----   Message from Rise Mu, PA-C sent at 02/03/2018 12:38 PM EST ----- Renal function is mildly elevated though stable and at her approximate baseline. Potassium is improved though remains below goal. Please have her take an additional 40 mEq of potassium today. She should then resume potassium 10 mEq only when she is taking Lasix 20 mg daily. Blood count is slightly lower than her baseline at 10.4.  This certainly may be in the setting of her hematoma.  Continue with planned CT to further evaluate hematoma and if intervention is required.  I would like for her to have a CBC drawn on 02/06/2018 while she is in the office getting her INR checked.

## 2018-02-03 NOTE — Telephone Encounter (Signed)
Patient made aware of results and verbalized understanding.  The patient will take an additional 40 mEq of the potassium today.  CBC has been ordered for the patient on Monday.   The patient is unsure if she should be taking the furosemide 20 mg daily or prn. She has been taking it daily along with her 10 mEq of the potassium.  Message has been routed for recommendations.

## 2018-02-05 NOTE — Telephone Encounter (Signed)
Please have her take Lasix 20 mg and KCl 10 mEq prn weight gain > 3 pounds overnight or 5 pounds in 1 week.

## 2018-02-06 ENCOUNTER — Ambulatory Visit (INDEPENDENT_AMBULATORY_CARE_PROVIDER_SITE_OTHER): Payer: BLUE CROSS/BLUE SHIELD

## 2018-02-06 ENCOUNTER — Other Ambulatory Visit
Admission: RE | Admit: 2018-02-06 | Discharge: 2018-02-06 | Disposition: A | Discharge status: Home / Self Care | Payer: BLUE CROSS/BLUE SHIELD | Source: Ambulatory Visit | Attending: Physician Assistant | Admitting: Physician Assistant

## 2018-02-06 DIAGNOSIS — Z5181 Encounter for therapeutic drug level monitoring: Secondary | ICD-10-CM | POA: Diagnosis not present

## 2018-02-06 DIAGNOSIS — I4891 Unspecified atrial fibrillation: Secondary | ICD-10-CM

## 2018-02-06 DIAGNOSIS — I050 Rheumatic mitral stenosis: Secondary | ICD-10-CM

## 2018-02-06 DIAGNOSIS — R710 Precipitous drop in hematocrit: Secondary | ICD-10-CM

## 2018-02-06 LAB — CBC
HCT: 30.7 % — ABNORMAL LOW (ref 36.0–46.0)
Hemoglobin: 9.6 g/dL — ABNORMAL LOW (ref 12.0–15.0)
MCH: 28.4 pg (ref 26.0–34.0)
MCHC: 31.3 g/dL (ref 30.0–36.0)
MCV: 90.8 fL (ref 80.0–100.0)
Platelets: 312 10*3/uL (ref 150–400)
RBC: 3.38 MIL/uL — ABNORMAL LOW (ref 3.87–5.11)
RDW: 16.1 % — ABNORMAL HIGH (ref 11.5–15.5)
WBC: 8.5 10*3/uL (ref 4.0–10.5)
nRBC: 0.2 % (ref 0.0–0.2)

## 2018-02-06 LAB — POCT INR: INR: 6.1 — AB (ref 2.0–3.0)

## 2018-02-06 NOTE — Patient Instructions (Addendum)
Since you've already taken your coumadin today, please skip coumadin tomorrow, Wednesday & Thursday, then resume dosage of 1 tablet (2.5 mg) every day.  Please take your coumadin in the evenings.  Try to get your greens on a schedule - pick a day or days each week to have your greens and have them every week on that day.  Recheck in 1 week.

## 2018-02-06 NOTE — Telephone Encounter (Signed)
Call to pt to relay information from provider regarding Lasix and K as needed for weight gain. Pt expressed understanding. No further questions at this time.   Advised pt to call for any further questions or concerns.

## 2018-02-06 NOTE — Addendum Note (Signed)
Addended by: Ricci Barker on: 02/06/2018 04:57 PM   Modules accepted: Orders

## 2018-02-07 ENCOUNTER — Encounter: Payer: Self-pay | Admitting: Emergency Medicine

## 2018-02-07 ENCOUNTER — Emergency Department: Payer: BLUE CROSS/BLUE SHIELD

## 2018-02-07 ENCOUNTER — Emergency Department
Admission: EM | Admit: 2018-02-07 | Discharge: 2018-02-07 | Disposition: A | Discharge status: Home / Self Care | Payer: BLUE CROSS/BLUE SHIELD | Attending: Emergency Medicine | Admitting: Emergency Medicine

## 2018-02-07 ENCOUNTER — Ambulatory Visit
Admission: RE | Admit: 2018-02-07 | Discharge: 2018-02-07 | Disposition: A | Discharge status: Home / Self Care | Payer: BLUE CROSS/BLUE SHIELD | Source: Ambulatory Visit | Attending: Physician Assistant | Admitting: Physician Assistant

## 2018-02-07 ENCOUNTER — Telehealth: Payer: Self-pay

## 2018-02-07 ENCOUNTER — Other Ambulatory Visit: Payer: Self-pay

## 2018-02-07 DIAGNOSIS — N289 Disorder of kidney and ureter, unspecified: Secondary | ICD-10-CM

## 2018-02-07 DIAGNOSIS — N189 Chronic kidney disease, unspecified: Secondary | ICD-10-CM | POA: Diagnosis not present

## 2018-02-07 DIAGNOSIS — R918 Other nonspecific abnormal finding of lung field: Secondary | ICD-10-CM | POA: Diagnosis not present

## 2018-02-07 DIAGNOSIS — I5030 Unspecified diastolic (congestive) heart failure: Secondary | ICD-10-CM

## 2018-02-07 DIAGNOSIS — F17210 Nicotine dependence, cigarettes, uncomplicated: Secondary | ICD-10-CM | POA: Insufficient documentation

## 2018-02-07 DIAGNOSIS — T148XXA Other injury of unspecified body region, initial encounter: Secondary | ICD-10-CM

## 2018-02-07 DIAGNOSIS — I130 Hypertensive heart and chronic kidney disease with heart failure and stage 1 through stage 4 chronic kidney disease, or unspecified chronic kidney disease: Secondary | ICD-10-CM | POA: Diagnosis not present

## 2018-02-07 DIAGNOSIS — I110 Hypertensive heart disease with heart failure: Secondary | ICD-10-CM | POA: Diagnosis not present

## 2018-02-07 DIAGNOSIS — Z96652 Presence of left artificial knee joint: Secondary | ICD-10-CM | POA: Insufficient documentation

## 2018-02-07 DIAGNOSIS — R002 Palpitations: Secondary | ICD-10-CM

## 2018-02-07 DIAGNOSIS — J189 Pneumonia, unspecified organism: Secondary | ICD-10-CM | POA: Diagnosis not present

## 2018-02-07 DIAGNOSIS — S7012XA Contusion of left thigh, initial encounter: Secondary | ICD-10-CM | POA: Diagnosis not present

## 2018-02-07 DIAGNOSIS — R710 Precipitous drop in hematocrit: Secondary | ICD-10-CM

## 2018-02-07 DIAGNOSIS — J168 Pneumonia due to other specified infectious organisms: Secondary | ICD-10-CM | POA: Diagnosis not present

## 2018-02-07 DIAGNOSIS — I509 Heart failure, unspecified: Secondary | ICD-10-CM | POA: Insufficient documentation

## 2018-02-07 LAB — INFLUENZA PANEL BY PCR (TYPE A & B)
Influenza A By PCR: NEGATIVE
Influenza B By PCR: NEGATIVE

## 2018-02-07 LAB — BASIC METABOLIC PANEL
Anion gap: 8 (ref 5–15)
BUN: 23 mg/dL — ABNORMAL HIGH (ref 6–20)
CO2: 26 mmol/L (ref 22–32)
Calcium: 9.4 mg/dL (ref 8.9–10.3)
Chloride: 102 mmol/L (ref 98–111)
Creatinine, Ser: 1.3 mg/dL — ABNORMAL HIGH (ref 0.44–1.00)
GFR calc Af Amer: 55 mL/min — ABNORMAL LOW (ref >60)
GFR calc non Af Amer: 47 mL/min — ABNORMAL LOW (ref >60)
Glucose, Bld: 116 mg/dL — ABNORMAL HIGH (ref 70–99)
Potassium: 3.8 mmol/L (ref 3.5–5.1)
Sodium: 136 mmol/L (ref 135–145)

## 2018-02-07 LAB — TROPONIN I
Troponin I: <0.03 ng/mL (ref <0.03)
Troponin I: <0.03 ng/mL (ref <0.03)

## 2018-02-07 LAB — CBC
HCT: 33.6 % — ABNORMAL LOW (ref 36.0–46.0)
Hemoglobin: 10.3 g/dL — ABNORMAL LOW (ref 12.0–15.0)
MCH: 28.1 pg (ref 26.0–34.0)
MCHC: 30.7 g/dL (ref 30.0–36.0)
MCV: 91.6 fL (ref 80.0–100.0)
Platelets: 327 10*3/uL (ref 150–400)
RBC: 3.67 MIL/uL — ABNORMAL LOW (ref 3.87–5.11)
RDW: 16.3 % — ABNORMAL HIGH (ref 11.5–15.5)
WBC: 6.9 10*3/uL (ref 4.0–10.5)
nRBC: 0 % (ref 0.0–0.2)

## 2018-02-07 LAB — PROTIME-INR
INR: 3.58
Prothrombin Time: 35.2 seconds — ABNORMAL HIGH (ref 11.4–15.2)

## 2018-02-07 LAB — BRAIN NATRIURETIC PEPTIDE: B Natriuretic Peptide: 269 pg/mL — ABNORMAL HIGH (ref 0.0–100.0)

## 2018-02-07 MED ORDER — ALBUTEROL SULFATE HFA 108 (90 BASE) MCG/ACT IN AERS: 2.0000 | INHALATION_SPRAY | RESPIRATORY_TRACT | 0 refills | Status: DC | PRN

## 2018-02-07 MED ORDER — LEVOFLOXACIN 750 MG PO TABS: 750.0000 mg | ORAL_TABLET | Freq: Every day | ORAL | 0 refills | Status: AC

## 2018-02-07 MED ORDER — WARFARIN SODIUM 2.5 MG PO TABS: 2.5000 mg | ORAL_TABLET | Freq: Every day | ORAL | 0 refills | Status: DC

## 2018-02-07 MED ORDER — SODIUM CHLORIDE 0.9% FLUSH: 3.0000 mL | Freq: Once | INTRAVENOUS | Status: DC

## 2018-02-07 MED ORDER — SODIUM CHLORIDE 0.9 % IV BOLUS
1000.0000 mL | Freq: Once | INTRAVENOUS | Status: AC
  Administered 2018-02-07: 1000 mL via INTRAVENOUS

## 2018-02-07 MED ORDER — SODIUM CHLORIDE 0.9 % IV SOLN
1.0000 g | Freq: Once | INTRAVENOUS | Status: AC
  Administered 2018-02-07: 1 g via INTRAVENOUS
  Filled 2018-02-07: qty 10

## 2018-02-07 MED ORDER — SODIUM CHLORIDE 0.9 % IV SOLN
500.0000 mg | Freq: Once | INTRAVENOUS | Status: AC
  Administered 2018-02-07: 500 mg via INTRAVENOUS
  Filled 2018-02-07: qty 500

## 2018-02-07 MED ORDER — IPRATROPIUM-ALBUTEROL 0.5-2.5 (3) MG/3ML IN SOLN
3.0000 mL | Freq: Once | RESPIRATORY_TRACT | Status: AC
  Administered 2018-02-07: 3 mL via RESPIRATORY_TRACT
  Filled 2018-02-07: qty 3

## 2018-02-07 NOTE — ED Notes (Signed)
Report to Kate, RN

## 2018-02-07 NOTE — Telephone Encounter (Signed)
Called pt to review lab results, pt verbalized understanding and I confirmed her appt time for CT this pm. She agreed to have CBC redraw in medical mall on Friday 02/10/18.  Order placed for CBC.   Advised pt to call for any further questions or concerns.

## 2018-02-07 NOTE — ED Notes (Signed)
Pt ambulated to and from bathroom with no difficulty. States that she feels winded. 96% on RA with ambulation to hallway bathroom which patient wants to go to.

## 2018-02-07 NOTE — ED Triage Notes (Signed)
Says about 645 felt heart racing and now short of breath.  Says she did take a baby aspirin and her metoprolol when she got to work.

## 2018-02-07 NOTE — Discharge Instructions (Addendum)
Please take the entire course of antibiotics, even if you are feeling better.  You may use albuterol if you are having coughing or wheezing.  Please make an appointment with your cardiologist to be rechecked for your heart failure.  Continue to take your Lasix as prescribed.  Return to the emergency department if you develop severe pain, lightheadedness or fainting, chest pain or shortness of breath, fever, inability to keep down fluids, or any other symptoms concerning to you.

## 2018-02-07 NOTE — Telephone Encounter (Signed)
-----   Message from Rise Mu, PA-C sent at 02/07/2018  7:08 AM EST ----- HGB is slightly lower, though relatively stable She is scheduled for CT today   Await scan results from today to evaluate if intervention is needed With regards to her anemia, if no intervention is needed with her hematoma, we will need to recheck a stat CBC on 1/24

## 2018-02-07 NOTE — ED Notes (Signed)
MD at bedside. 

## 2018-02-07 NOTE — ED Notes (Signed)
First Nurse Note: Patient complaining of [facility] and feeling like she has Atrial Fib.  Radial pulse 112 / regular.  Alert and oriented.

## 2018-02-07 NOTE — ED Provider Notes (Signed)
[facility] Emergency Department Provider Note  ____________________________________________  Time seen: Approximately 9:05 AM  I have reviewed the triage vital signs and the nursing notes.   HISTORY  Chief Complaint Shortness of Breath and Palpitations    HPI Leslie [name] is a 53 y.o. female with a history of paroxysmal A. fib on warfarin, diastolic CHF with last echo showing EF of 60 to 65% 8/19, HTN, HL, presenting with palpitations.  The patient reports that for a month, she has had a nonproductive cough with congestion and an intermittently scratchy throat.  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  Today, the patient was driving to work when she developed a heart racing sensation in the center of her chest with shortness of breath.  She parked and was walking into her building when she developed more severe shortness of breath and had to sit down.  She did not experience in any chest pain or discomfort.  She has not been having any lower extremity edema or calf pain.  At this time, she is feeling better.  She did get her influenza vaccination this year.  Past Medical History:  Diagnosis Date  . (HFpEF) heart failure with preserved ejection fraction ([place])    a. 08/2017 Echo: EF 55-60%.  Grade 2 diastolic dysfunction.  Moderate mitral stenosis.  . Allergy   . Anemia   . Anxiety   . BRCA negative 03/22/2013  . Bronchitis 02/19/2016   ON LEVAQUIN PO  . Chest tightness   . Cigarette smoker 09/11/2017   8-10 day  . GERD (gastroesophageal reflux disease)   . History of kidney stones   . Hyperlipidemia   . Hypertension   . Moderate mitral stenosis    a.  08/2017 TEE: EF 60 to 65%.  Moderate mitral stenosis.  Mean gradient 14 mmHg.  Valve area 2.59 cm by planimetry, 2.72 cm by pressure half-time.  . Palpitations   . Persistent atrial fibrillation    a. 08/2017 s/p TEE/DCCV; b. CHA2DS2VASc =  2-->warfarin.    Patient Active Problem List   Diagnosis Date Noted  . Acute recurrent pansinusitis 01/13/2018  . Candidiasis 01/13/2018  . Anxiety 01/13/2018  . Osteoarthritis of knee 01/09/2018  . Morbid obesity with body mass index (BMI) of 40.0 or higher ([place]) 11/13/2017  . Major depressive disorder 09/22/2017  . Mitral valve stenosis   . Atrial fibrillation ([place]) 09/11/2017  . Generalized abdominal pain 09/08/2017  . Episode of recurrent major depressive disorder ([place]) 09/08/2017  . Hypertension 06/06/2017  . Cough 06/06/2017  . Tobacco dependency 06/06/2017  . Urinary tract infection with hematuria 04/28/2017  . Acute upper respiratory infection 04/28/2017  . Flu-like symptoms 04/28/2017  . Dysuria 04/28/2017  . Vaginal candidiasis 04/28/2017  . Pain of left lower extremity 02/11/2016  . 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 Fibrocystic breast disease 03/23/2013  . Family history of breast cancer 07/20/2012    Past Surgical History:  Procedure Laterality Date  . ABDOMINAL HYSTERECTOMY     total  . ABDOMINAL HYSTERECTOMY    . BREAST BIOPSY Bilateral 2012  . BREAST BIOPSY Right 08-07-12   fibroadenomatous changes and columnar cells  . BREAST BIOPSY  02/03/2015   stereo byrnett  . CHOLECYSTECTOMY N/A 02/27/2016   Procedure: LAPAROSCOPIC CHOLECYSTECTOMY;  Surgeon: Christene Lye, MD;  Location: [facility] ORS;  Service: General;  Laterality: N/A;  . JOINT REPLACEMENT Left   .  KNEE CLOSED REDUCTION Left 04/15/2015   Procedure: CLOSED MANIPULATION KNEE;  Surgeon: Thornton Park, MD;  Location: [facility] ORS;  Service: Orthopedics;  Laterality: Left;  . KNEE SURGERY    . TEE WITHOUT CARDIOVERSION N/A 09/13/2017   Procedure: TRANSESOPHAGEAL ECHOCARDIOGRAM (TEE);  Surgeon: Nelva Bush, MD;  Location: [facility] ORS;  Service: Cardiovascular;  Laterality: N/A;  . TOTAL KNEE ARTHROPLASTY Left 12/25/2014   Procedure: TOTAL KNEE ARTHROPLASTY;  Surgeon: Thornton Park,  MD;  Location: [facility] ORS;  Service: Orthopedics;  Laterality: Left;  . TUBAL LIGATION      Current Outpatient Rx  . Order #: [id number] Class: Print  . Order #: [id number] Class: Normal  . Order #: [id number] Class: Normal  . Order #: [id number] Class: Normal  . Order #: [id number] Class: Normal  . Order #: [id number] Class: No Print  . Order #: [id number] Class: Print  . Order #: [id number] Class: Normal  . Order #: [id number] Class: Normal  . Order #: [id number] Class: Historical Med  . Order #: [id number] Class: Normal  . Order #: [id number] Class: Normal  . Order #: [id number] Class: Normal    Allergies Morphine; Oxycodone hcl; Dilaudid [hydromorphone hcl]; Morphine and related; and Oxycodone  Family History  Problem Relation Age of Onset  . Cancer Mother 62       breast  . Cancer Maternal Aunt        breast  . Cancer Maternal Grandmother        breast    Social History Social History   Tobacco Use  . Smoking status: Current Some Day Smoker    Packs/day: 0.50    Years: 20.00    Pack years: 10.00    Types: Cigarettes  . Smokeless tobacco: Never Used  . Tobacco comment: down to 2-3 daily  Substance Use Topics  . Alcohol use: Not Currently    Alcohol/week: 2.0 standard drinks    Types: 2 Glasses of wine per week    Comment: wine occ  . Drug use: No    Review of Systems Constitutional: No fever/chills.  No lightheadedness or syncope.  No diaphoresis. Eyes: No visual changes. ENT: Positive scratchy throat.  Positive congestion without rhinorrhea. Cardiovascular: Denies chest pain. Denies palpitations. Respiratory: Positive shortness of breath.  Positive cough. Gastrointestinal: No abdominal pain.  No nausea, no vomiting.  No diarrhea.  No constipation. Genitourinary: Negative for dysuria. Musculoskeletal: Negative for back pain.  No lower extremity swelling or calf pain. Skin: Negative for rash. Neurological: Negative for headaches. No focal numbness, tingling or weakness.      ____________________________________________   PHYSICAL EXAM:  VITAL SIGNS: ED Triage Vitals  Enc Vitals Group     BP 02/07/18 0755 137/66     Pulse Rate 02/07/18 0755 90     Resp 02/07/18 0755 18     Temp 02/07/18 0755 (!) 97.4 F (36.3 C)     Temp Source 02/07/18 0755 Oral     SpO2 02/07/18 0755 95 %     Weight 02/07/18 0752 242 lb 4.6 oz (109.9 kg)     Height 02/07/18 0752 '5\' 4"'  (1.626 m)     Head Circumference --      Peak Flow --      Pain Score 02/07/18 0752 0     Pain Loc --      Pain Edu? --      Excl. in [place]? --     Constitutional: Alert and oriented.  Answers questions appropriately. Eyes: Conjunctivae are normal.  EOMI. No scleral icterus. Head: Atraumatic. Nose: No congestion/rhinnorhea. Mouth/Throat:  Mucous membranes are moist.  Neck: No stridor.  Supple.  No JVD.  No meningismus. Cardiovascular: Normal rate, regular rhythm. No murmurs, rubs or gallops.  Respiratory: Normal respiratory effort.  No accessory muscle use or retractions.  Expiratory wheezing in the bilateral lung bases.  No rales or rhonchi. Gastrointestinal: Soft, nontender and nondistended.  No guarding or rebound.  No peritoneal signs. Musculoskeletal: No LE edema. No ttp in the calves or palpable cords.  Negative Homan's sign. Neurologic:  A&Ox3.  Speech is clear.  Face and smile are symmetric.  EOMI.  Moves all extremities well. Skin:  Skin is warm, dry and intact. No rash noted. Psychiatric: Mood and affect are normal. Speech and behavior are normal.  Normal judgement.  ____________________________________________   LABS (all labs ordered are listed, but only abnormal results are displayed)  Labs Reviewed  BASIC METABOLIC PANEL - Abnormal; Notable for the following components:      Result Value   Glucose, Bld 116 (*)    BUN 23 (*)    Creatinine, Ser 1.30 (*)    GFR calc non Af Amer 47 (*)    GFR calc Af Amer 55 (*)    All other components within normal limits  CBC - Abnormal;  Notable for the following components:   RBC 3.67 (*)    Hemoglobin 10.3 (*)    HCT 33.6 (*)    RDW 16.3 (*)    All other components within normal limits  BRAIN NATRIURETIC PEPTIDE - Abnormal; Notable for the following components:   B Natriuretic Peptide 269.0 (*)    All other components within normal limits  PROTIME-INR - Abnormal; Notable for the following components:   Prothrombin Time 35.2 (*)    All other components within normal limits  CULTURE, BLOOD (ROUTINE X 2)  CULTURE, BLOOD (ROUTINE X 2)  TROPONIN I  INFLUENZA PANEL BY PCR (TYPE A & B)  TROPONIN I  POC URINE PREG, ED   ____________________________________________  EKG  ED ECG REPORT I, Anne-Caroline Mariea Clonts, the attending physician, personally viewed and interpreted this ECG.   Date: 02/07/2018  EKG Time: 753  Rate: 88  Rhythm: normal sinus rhythm  Axis: normal  Intervals:none  ST&T Change: NO STEMI  ____________________________________________  RADIOLOGY  Dg Chest 2 View  Result Date: 02/07/2018 CLINICAL DATA:  Atrial fibrillation.  Heart racing this morning EXAM: CHEST - 2 VIEW COMPARISON:  January 17, 2018 FINDINGS: The heart size and mediastinal contours are within normal limits. Diffuse increased pulmonary interstitial markings are identified. This is worse compared prior exam. There is no pleural effusion. Mild patchy opacity of both lung bases are noted. The visualized skeletal structures are stable. IMPRESSION: Diffuse increased pulmonary interstitial markings are identified, worse compared prior exam. Although there is likely chronic interstitial change, superimposed interstitial edema is not excluded. Patchy opacity of both lung bases, superimposed pneumonia is not excluded. Electronically Signed   By: Abelardo Diesel M.D.   On: 02/07/2018 08:16    ____________________________________________   PROCEDURES  Procedure(s) performed: None  Procedures  Critical Care performed:  No ____________________________________________   INITIAL IMPRESSION / ASSESSMENT AND PLAN / ED COURSE  Pertinent labs & imaging results that were available during my care of the patient were reviewed by me and considered in my medical decision making (see chart for details).  53 y.o. female with a history of A. fib on warfarin, diastolic CHF with preserved ejection fraction presenting with an episode of palpitations and shortness of breath, as well  as 1 month of cough and cold symptoms.  Overall, the patient is well-appearing and asymptomatic at this time.  She does have an end expiratory wheeze now treated with a DuoNeb for this.  This may be related to pulmonary edema, or emphysematous changes.  I do not see a significant amount of peripheral edema, but the patient's chest x-ray does show increased interstitial markings which may be due to edema versus superimposed pneumonia.  Given the patient's pulmonary symptoms, will order blood cultures and treat the patient with a start azithromycin and ceftriaxone for possible community-acquired pneumonia.  I have also ordered an influenza swab.  A BNP is also pending for evaluation of the patient's CHF.  PE is considered but less likely.  Troponin is also pending.  Plan reevaluation for final disposition.  ----------------------------------------- 11:25 AM on 02/07/2018 -----------------------------------------  Patient has done well in the emergency department she continues to be afebrile and hemodynamically stable.  She was able to ambulate and maintain oxygen saturations that were normal throughout the entire time of her ambulation.  She has been able to eat drink and walk to the bathroom without difficulty.  The patient's clinical history is most suggestive of a pneumonia and she has received intravenous antibiotics in the emergency department.  I will plan to discharge her home with a 7-day course of Levaquin and have talked to her about close PMD  follow-up.  The patient's influenza testing is negative.  She does have some suggestions of congestive heart failure, but I am not convinced that she has any symptoms that would require admission today.  Her BNP is 269 and I do not see that we have a previous level.  Her first troponin is negative and her second 1 is pending at this time.  I have asked her to follow-up with her cardiologist, Dr. Rockey Situ, as an outpatient for further evaluation.  I have asked her to continue her Lasix but to please monitor your kidney function, which is slightly bumped today.  I have also given her return precautions.  ____________________________________________  FINAL CLINICAL IMPRESSION(S) / ED DIAGNOSES  Final diagnoses:  Renal insufficiency  Pneumonia of both lungs due to infectious organism, unspecified part of lung  Diastolic congestive heart failure, unspecified HF chronicity (HCC)  Palpitations         NEW MEDICATIONS STARTED DURING THIS VISIT:  New Prescriptions   LEVOFLOXACIN (LEVAQUIN) 750 MG TABLET    Take 1 tablet (750 mg total) by mouth daily for 7 days.      Eula Listen, MD 02/07/18 [phone]

## 2018-02-07 NOTE — ED Notes (Signed)
Pt ambulated to toilet by self with steady gait noted. Family member at bedside. Will draw blood once pt returns.

## 2018-02-07 NOTE — Telephone Encounter (Signed)
Warfarin 2.5 mg, take as directed by the coumadin clinic, #30 w/ 0 refills sent to [facility] [address].

## 2018-02-12 LAB — CULTURE, BLOOD (ROUTINE X 2)
Culture: NO GROWTH
Culture: NO GROWTH
Special Requests: ADEQUATE
Special Requests: ADEQUATE

## 2018-02-13 ENCOUNTER — Ambulatory Visit: Payer: BLUE CROSS/BLUE SHIELD

## 2018-02-13 ENCOUNTER — Telehealth: Payer: Self-pay

## 2018-02-13 DIAGNOSIS — I050 Rheumatic mitral stenosis: Secondary | ICD-10-CM | POA: Diagnosis not present

## 2018-02-13 DIAGNOSIS — I4891 Unspecified atrial fibrillation: Secondary | ICD-10-CM

## 2018-02-13 DIAGNOSIS — Z5181 Encounter for therapeutic drug level monitoring: Secondary | ICD-10-CM

## 2018-02-13 LAB — POCT INR: INR: 2.7 (ref 2.0–3.0)

## 2018-02-13 NOTE — Patient Instructions (Signed)
Please continue dosage of 1 tablet (2.5 mg) every day.  Please take your coumadin in the evenings.  Try to get your greens on a schedule - pick a day or days each week to have your greens and have them every week on that day.  Recheck in 2 weeks.

## 2018-02-13 NOTE — Telephone Encounter (Signed)
Recieved request from : Scanned to [facility]  Forwarded to [employer] for processing with Galen Manila and $25 personal check fee

## 2018-02-13 NOTE — Telephone Encounter (Signed)
Called pt to discuss lab that was ordered last week for CBC. pt reported that she had repeat lab work done while she was in the ED for different complaint. CBC appeared to trending up.  She reported that hematoma was getting better and had no complaints at this time. pt has f/u with Christell Faith, PA on 02/23/18.  Advised pt to call for any further questions or concerns.

## 2018-02-14 ENCOUNTER — Encounter: Payer: Self-pay | Admitting: General Surgery

## 2018-02-16 ENCOUNTER — Ambulatory Visit (INDEPENDENT_AMBULATORY_CARE_PROVIDER_SITE_OTHER): Payer: BC Managed Care – PPO | Admitting: Nurse Practitioner

## 2018-02-16 ENCOUNTER — Encounter: Payer: Self-pay | Admitting: Nurse Practitioner

## 2018-02-16 VITALS — BP 128/66 | HR 81 | Resp 16 | Ht 64.0 in | Wt 246.2 lb

## 2018-02-16 DIAGNOSIS — R7989 Other specified abnormal findings of blood chemistry: Secondary | ICD-10-CM | POA: Diagnosis not present

## 2018-02-16 DIAGNOSIS — I4891 Unspecified atrial fibrillation: Secondary | ICD-10-CM

## 2018-02-16 DIAGNOSIS — J189 Pneumonia, unspecified organism: Secondary | ICD-10-CM

## 2018-02-16 DIAGNOSIS — N289 Disorder of kidney and ureter, unspecified: Secondary | ICD-10-CM | POA: Diagnosis not present

## 2018-02-16 DIAGNOSIS — B3731 Acute candidiasis of vulva and vagina: Secondary | ICD-10-CM

## 2018-02-16 DIAGNOSIS — B373 Candidiasis of vulva and vagina: Secondary | ICD-10-CM | POA: Diagnosis not present

## 2018-02-16 MED ORDER — FLUCONAZOLE 150 MG PO TABS: ORAL_TABLET | ORAL | 0 refills | Status: DC

## 2018-02-16 MED ORDER — LEVOFLOXACIN 500 MG PO TABS: 500.0000 mg | ORAL_TABLET | Freq: Every day | ORAL | 0 refills | Status: DC

## 2018-02-16 NOTE — Progress Notes (Signed)
[facility] [place], [place] [address]  Internal MEDICINE  Office Visit Note  Patient Name: Leslie [name]  [id number]  [id number]  Date of Service: 02/16/2018     Chief Complaint  Patient presents with  . Pneumonia    Hospital follow up  . Shortness of Breath  . Palpitations  . Pain    pt had a ct scan done on left side, she still have some pain     The patient was seen in ER 02/07/2018 for shortness of breath and fatigue. She thought her a-fib had returned. Chest x-ray revealed she had pneumonia. She was given a breathing treatment and IV zithromax. Was discharged wit horal levaquin. She took the last dose yesterday. Still has some shortness of breath and cough. Her labs were also drawn. She had elevated renal functions and BNP was also elevated.   Pt is here for recent hospital follow up.  Current Medication: Outpatient Encounter Medications as of 02/16/2018  Medication Sig  . albuterol (PROVENTIL HFA;VENTOLIN HFA) 108 (90 Base) MCG/ACT inhaler Inhale 2 puffs into the lungs every 4 (four) hours as needed for wheezing or shortness of breath.  . ALPRAZolam (XANAX) 0.5 MG tablet Take 1 tablet (0.5 mg total) by mouth at bedtime as needed for anxiety.  Marland Kitchen atorvastatin (LIPITOR) 20 MG tablet Take 1 tablet (20 mg total) by mouth daily at 6 PM.  . diltiazem (CARDIZEM CD) 240 MG 24 hr capsule Take 1 capsule (240 mg total) by mouth daily.  Marland Kitchen estradiol (ESTRACE) 0.5 MG tablet Take 1 tablet (0.5 mg total) by mouth daily.  . furosemide (LASIX) 20 MG tablet Take 1 tablet (20 mg total) by mouth daily as needed. For weight gain greater than 2 lb in 24 hours and 5 lb in a week.  . metoprolol succinate (TOPROL-XL) 25 MG 24 hr tablet Take 3 tablets (75 mg total) by mouth daily. Take with or immediately following a meal.  . montelukast (SINGULAIR) 10 MG tablet Take 1 tablet (10 mg total) by mouth at bedtime.  . Multiple Vitamin (MULTIVITAMIN) capsule Take 1 capsule by  mouth daily.  Marland Kitchen omeprazole (PRILOSEC) 40 MG capsule Take 1 capsule (40 mg total) by mouth daily.  . potassium chloride (K-DUR) 10 MEQ tablet Take 1 tablet (10 mEq total) by mouth daily.  Marland Kitchen warfarin (COUMADIN) 2.5 MG tablet Take 1 tablet (2.5 mg total) by mouth daily for 30 days. As directed by the coumadin clinic.  . fluconazole (DIFLUCAN) 150 MG tablet Take 1 tablet po once. May repeat dose in 3 days as needed for persistent symptoms.  Marland Kitchen levofloxacin (LEVAQUIN) 500 MG tablet Take 1 tablet (500 mg total) by mouth daily.   No facility-administered encounter medications on file as of 02/16/2018.     Surgical History: Past Surgical History:  Procedure Laterality Date  . ABDOMINAL HYSTERECTOMY     total  . ABDOMINAL HYSTERECTOMY    . BREAST BIOPSY Bilateral 2012  . BREAST BIOPSY Right 08-07-12   fibroadenomatous changes and columnar cells  . BREAST BIOPSY  02/03/2015   stereo byrnett  . CHOLECYSTECTOMY N/A 02/27/2016   Procedure: LAPAROSCOPIC CHOLECYSTECTOMY;  Surgeon: Christene Lye, MD;  Location: [facility] ORS;  Service: General;  Laterality: N/A;  . JOINT REPLACEMENT Left   . KNEE CLOSED REDUCTION Left 04/15/2015   Procedure: CLOSED MANIPULATION KNEE;  Surgeon: Thornton Park, MD;  Location: [facility] ORS;  Service: Orthopedics;  Laterality: Left;  . KNEE SURGERY    .  TEE WITHOUT CARDIOVERSION N/A 09/13/2017   Procedure: TRANSESOPHAGEAL ECHOCARDIOGRAM (TEE);  Surgeon: Nelva Bush, MD;  Location: [facility] ORS;  Service: Cardiovascular;  Laterality: N/A;  . TOTAL KNEE ARTHROPLASTY Left 12/25/2014   Procedure: TOTAL KNEE ARTHROPLASTY;  Surgeon: Thornton Park, MD;  Location: [facility] ORS;  Service: Orthopedics;  Laterality: Left;  . TUBAL LIGATION      Medical History: Past Medical History:  Diagnosis Date  . (HFpEF) heart failure with preserved ejection fraction ([place])    a. 08/2017 Echo: EF 55-60%.  Grade 2 diastolic dysfunction.  Moderate mitral stenosis.  . Allergy   . Anemia   . Anxiety    . BRCA negative 03/22/2013  . Bronchitis 02/19/2016   ON LEVAQUIN PO  . Chest tightness   . Cigarette smoker 09/11/2017   8-10 day  . GERD (gastroesophageal reflux disease)   . History of kidney stones   . Hyperlipidemia   . Hypertension   . Moderate mitral stenosis    a.  08/2017 TEE: EF 60 to 65%.  Moderate mitral stenosis.  Mean gradient 14 mmHg.  Valve area 2.59 cm by planimetry, 2.72 cm by pressure half-time.  . Palpitations   . Persistent atrial fibrillation    a. 08/2017 s/p TEE/DCCV; b. CHA2DS2VASc = 2-->warfarin.    Family History: Family History  Problem Relation Age of Onset  . Cancer Mother 60       breast  . Cancer Maternal Aunt        breast  . Cancer Maternal Grandmother        breast    Social History   Socioeconomic History  . Marital status: Married    Spouse name: Not on file  . Number of children: Not on file  . Years of education: Not on file  . Highest education level: Not on file  Occupational History  . Not on file  Social Needs  . Financial resource strain: Not on file  . Food insecurity:    Worry: Not on file    Inability: Not on file  . Transportation needs:    Medical: Not on file    Non-medical: Not on file  Tobacco Use  . Smoking status: Current Some Day Smoker    Packs/day: 0.50    Years: 20.00    Pack years: 10.00    Types: Cigarettes  . Smokeless tobacco: Never Used  . Tobacco comment: down to 2-3 daily  Substance and Sexual Activity  . Alcohol use: Not Currently    Alcohol/week: 2.0 standard drinks    Types: 2 Glasses of wine per week    Comment: wine occ  . Drug use: No  . Sexual activity: Not on file  Lifestyle  . Physical activity:    Days per week: Not on file    Minutes per session: Not on file  . Stress: Not on file  Relationships  . Social connections:    Talks on phone: Not on file    Gets together: Not on file    Attends religious service: Not on file    Active member of club or organization: Not on  file    Attends meetings of clubs or organizations: Not on file    Relationship status: Not on file  . Intimate partner violence:    Fear of current or ex partner: Not on file    Emotionally abused: Not on file    Physically abused: Not on file    Forced sexual activity: Not on file  Other Topics Concern  . Not on file  Social History Narrative  . Not on file      Review of Systems  Constitutional: Positive for fatigue. Negative for chills, fever and unexpected weight change.  HENT: Positive for congestion and postnasal drip. Negative for rhinorrhea, sneezing and sore throat.   Respiratory: Positive for cough and wheezing. Negative for chest tightness and shortness of breath.   Cardiovascular: Negative for chest pain and palpitations.  Gastrointestinal: Negative for abdominal pain, constipation, diarrhea, nausea and vomiting.  Endocrine: Negative for cold intolerance, heat intolerance, polydipsia and polyuria.  Musculoskeletal: Negative for arthralgias, back pain, joint swelling and neck pain.  Skin: Negative for rash.  Allergic/Immunologic: Positive for environmental allergies.  Neurological: Negative for dizziness, tremors, numbness and headaches.  Hematological: Negative for adenopathy. Does not bruise/bleed easily.  Psychiatric/Behavioral: Negative for behavioral problems (Depression), dysphoric mood, sleep disturbance and suicidal ideas. The patient is not nervous/anxious.    Today's Vitals   02/16/18 0956  BP: 128/66  Pulse: 81  Resp: 16  SpO2: 95%  Weight: 246 lb 3.2 oz (111.7 kg)  Height: '5\' 4"'  (1.626 m)   Body mass index is 42.26 kg/m.  Physical Exam Vitals signs and nursing note reviewed.  Constitutional:      General: She is not in acute distress.    Appearance: Normal appearance. She is well-developed. She is not diaphoretic.  HENT:     Head: Normocephalic and atraumatic.     Mouth/Throat:     Pharynx: No oropharyngeal exudate.  Eyes:     Pupils: Pupils  are equal, round, and reactive to light.  Neck:     Musculoskeletal: Normal range of motion and neck supple.     Thyroid: No thyromegaly.     Vascular: No JVD.     Trachea: No tracheal deviation.  Cardiovascular:     Rate and Rhythm: Normal rate. Rhythm irregular.     Heart sounds: Normal heart sounds. No murmur. No friction rub. No gallop.   Pulmonary:     Effort: Pulmonary effort is normal. No respiratory distress.     Breath sounds: Wheezing present. No rales.     Comments: Congested, non-productive cough present.  Chest:     Chest wall: No tenderness.  Abdominal:     General: Bowel sounds are normal.     Palpations: Abdomen is soft.     Tenderness: There is no abdominal tenderness.  Musculoskeletal:     Comments: Large hematoma present on left hip.   Lymphadenopathy:     Cervical: No cervical adenopathy.  Skin:    General: Skin is warm and dry.  Neurological:     Mental Status: She is alert and oriented to person, place, and time.     Cranial Nerves: No cranial nerve deficit.  Psychiatric:        Behavior: Behavior normal.        Thought Content: Thought content normal.        Judgment: Judgment normal.   Assessment/Plan: 1. Community acquired pneumonia, unspecified laterality Reviewed x-rays, labs, and progress notes from recent ER visit. Positive for pneumonia. Still having wheezies bilaterally. Continue levaquin 558m daily for additional 5 days.  - levofloxacin (LEVAQUIN) 500 MG tablet; Take 1 tablet (500 mg total) by mouth daily.  Dispense: 5 tablet; Refill: 0  2. Elevated brain natriuretic peptide (BNP) level BNP elevated in ER. Will repeat BNP. Follow up with cardiology as scheduled.  - B Nat Peptide  3. Abnormal renal function  Repeat BMP.  - Basic Metabolic Panel (BMET)  4. Vaginal candidiasis - fluconazole (DIFLUCAN) 150 MG tablet; Take 1 tablet po once. May repeat dose in 3 days as needed for persistent symptoms.  Dispense: 3 tablet; Refill: 0  5. Atrial  fibrillation, unspecified type ([place]) Stable. Follow up with cardiology as scheduled.   General Counseling: Reianna verbalizes understanding of the findings of todays visit and agrees with plan of treatment. I have discussed any further diagnostic evaluation that may be needed or ordered today. We also reviewed her medications today. she has been encouraged to call the office with any questions or concerns that should arise related to todays visit.    Counseling:  This patient was seen by Leretha Pol FNP Collaboration with Dr Lavera Guise as a part of collaborative care agreement  Orders Placed This Encounter  Procedures  . B Nat Peptide  . Basic Metabolic Panel (BMET)      I have reviewed all medical records from hospital follow up including radiology reports and consults from other physicians. Appropriate follow up diagnostics will be scheduled as needed. Patient/ Family understands the plan of treatment. Time spent 30 minutes.   Dr Lavera Guise, MD Internal Medicine

## 2018-02-21 DIAGNOSIS — M2391 Unspecified internal derangement of right knee: Secondary | ICD-10-CM | POA: Diagnosis not present

## 2018-02-22 NOTE — Telephone Encounter (Signed)
[employer] forms obtained from nurse bin- placed on Leslie [name]'s desk to review when he returns to the office.

## 2018-02-22 NOTE — Telephone Encounter (Signed)
Received forms to be completed by physician, placed in nurses box

## 2018-02-23 ENCOUNTER — Encounter: Payer: Self-pay | Admitting: General Surgery

## 2018-02-23 ENCOUNTER — Other Ambulatory Visit: Payer: Self-pay

## 2018-02-23 ENCOUNTER — Ambulatory Visit: Payer: BLUE CROSS/BLUE SHIELD | Admitting: Physician Assistant

## 2018-02-23 ENCOUNTER — Ambulatory Visit (INDEPENDENT_AMBULATORY_CARE_PROVIDER_SITE_OTHER): Payer: BLUE CROSS/BLUE SHIELD | Admitting: General Surgery

## 2018-02-23 ENCOUNTER — Telehealth: Payer: Self-pay | Admitting: Cardiovascular Disease

## 2018-02-23 ENCOUNTER — Ambulatory Visit: Payer: Self-pay | Admitting: General Surgery

## 2018-02-23 VITALS — BP 125/57 | HR 80 | Temp 97.7°F | Resp 20 | Ht 66.0 in | Wt 247.0 lb

## 2018-02-23 DIAGNOSIS — Z5181 Encounter for therapeutic drug level monitoring: Secondary | ICD-10-CM

## 2018-02-23 DIAGNOSIS — N6011 Diffuse cystic mastopathy of right breast: Secondary | ICD-10-CM

## 2018-02-23 NOTE — Telephone Encounter (Signed)
   [facility] Pre-operative Risk Assessment    Request for surgical clearance:  1. What type of surgery is being performed? Right Knee Arthroscopy  2. When is this surgery scheduled? Not listed  3. What type of clearance is required (medical clearance vs. Pharmacy clearance to hold med vs. Both)? pharmacy  4. Are there any medications that need to be held prior to surgery and how long? Coumadin  5. Practice name and name of physician performing surgery? [facility] Dr. Marry Guan  What is your office phone number [phone]   7.   What is your office fax number [phone]  8.   Anesthesia type (None, local, MAC, general) ? Not listed   Lucienne Minks 02/23/2018, 10:26 AM  _________________________________________________________________   (provider comments below)

## 2018-02-23 NOTE — Progress Notes (Signed)
Patient ID: Leslie [name], female   DOB: 1965/08/01, 53 y.o.   MRN: [id number]  Chief Complaint  Patient presents with  . Follow-up    HPI Leslie [name] is a 53 y.o. female who presents for a breast evaluation. The most recent mammogram was done on 02/02/2018.  Patient does perform regular self breast checks and gets regular mammograms done.    HPI  Past Medical History:  Diagnosis Date  . (HFpEF) heart failure with preserved ejection fraction ([place])    a. 08/2017 Echo: EF 55-60%.  Grade 2 diastolic dysfunction.  Moderate mitral stenosis.  . Allergy   . Anemia   . Anxiety   . BRCA negative 03/22/2013  . Bronchitis 02/19/2016   ON LEVAQUIN PO  . Chest tightness   . Cigarette smoker 09/11/2017   8-10 day  . GERD (gastroesophageal reflux disease)   . History of kidney stones   . Hyperlipidemia   . Hypertension   . Moderate mitral stenosis    a.  08/2017 TEE: EF 60 to 65%.  Moderate mitral stenosis.  Mean gradient 14 mmHg.  Valve area 2.59 cm by planimetry, 2.72 cm by pressure half-time.  . Palpitations   . Persistent atrial fibrillation    a. 08/2017 s/p TEE/DCCV; b. CHA2DS2VASc = 2-->warfarin.    Past Surgical History:  Procedure Laterality Date  . ABDOMINAL HYSTERECTOMY     total  . ABDOMINAL HYSTERECTOMY    . BREAST BIOPSY Bilateral 2012  . BREAST BIOPSY Right 08-07-12   fibroadenomatous changes and columnar cells  . BREAST BIOPSY  02/03/2015   stereo [name]  . CHOLECYSTECTOMY N/A 02/27/2016   Procedure: LAPAROSCOPIC CHOLECYSTECTOMY;  Surgeon: Christene Lye, MD;  Location: [facility] ORS;  Service: General;  Laterality: N/A;  . JOINT REPLACEMENT Left   . KNEE CLOSED REDUCTION Left 04/15/2015   Procedure: CLOSED MANIPULATION KNEE;  Surgeon: Thornton Park, MD;  Location: [facility] ORS;  Service: Orthopedics;  Laterality: Left;  . KNEE SURGERY    . TEE WITHOUT CARDIOVERSION N/A 09/13/2017   Procedure: TRANSESOPHAGEAL ECHOCARDIOGRAM (TEE);  Surgeon: Nelva Bush, MD;   Location: [facility] ORS;  Service: Cardiovascular;  Laterality: N/A;  . TOTAL KNEE ARTHROPLASTY Left 12/25/2014   Procedure: TOTAL KNEE ARTHROPLASTY;  Surgeon: Thornton Park, MD;  Location: [facility] ORS;  Service: Orthopedics;  Laterality: Left;  . TUBAL LIGATION      Family History  Problem Relation Age of Onset  . Cancer Mother 16       breast  . Cancer Maternal Aunt        breast  . Cancer Maternal Grandmother        breast    Social History Social History   Tobacco Use  . Smoking status: Current Some Day Smoker    Packs/day: 0.50    Years: 20.00    Pack years: 10.00    Types: Cigarettes  . Smokeless tobacco: Never Used  . Tobacco comment: down to 2-3 daily  Substance Use Topics  . Alcohol use: Not Currently    Alcohol/week: 2.0 standard drinks    Types: 2 Glasses of wine per week    Comment: wine occ  . Drug use: No    Allergies  Allergen Reactions  . Morphine Hives and Rash  . Oxycodone Hcl Itching  . Dilaudid [Hydromorphone Hcl] Itching  . Morphine And Related Hives  . Oxycodone Itching and Rash    Current Outpatient Medications  Medication Sig Dispense Refill  . albuterol (PROVENTIL HFA;VENTOLIN HFA) 108 (  90 Base) MCG/ACT inhaler Inhale 2 puffs into the lungs every 4 (four) hours as needed for wheezing or shortness of breath. 1 Inhaler 0  . ALPRAZolam (XANAX) 0.5 MG tablet Take 1 tablet (0.5 mg total) by mouth at bedtime as needed for anxiety. 90 tablet 1  . atorvastatin (LIPITOR) 20 MG tablet Take 1 tablet (20 mg total) by mouth daily at 6 PM. 90 tablet 2  . diltiazem (CARDIZEM CD) 240 MG 24 hr capsule Take 1 capsule (240 mg total) by mouth daily. 90 capsule 3  . estradiol (ESTRACE) 0.5 MG tablet Take 1 tablet (0.5 mg total) by mouth daily. 90 tablet 1  . fluconazole (DIFLUCAN) 150 MG tablet Take 1 tablet po once. May repeat dose in 3 days as needed for persistent symptoms. 3 tablet 0  . furosemide (LASIX) 20 MG tablet Take 1 tablet (20 mg total) by mouth daily as  needed. For weight gain greater than 2 lb in 24 hours and 5 lb in a week. 30 tablet 5  . levofloxacin (LEVAQUIN) 500 MG tablet Take 1 tablet (500 mg total) by mouth daily. 5 tablet 0  . metoprolol succinate (TOPROL-XL) 25 MG 24 hr tablet Take 3 tablets (75 mg total) by mouth daily. Take with or immediately following a meal. 90 tablet 11  . montelukast (SINGULAIR) 10 MG tablet Take 1 tablet (10 mg total) by mouth at bedtime. 90 tablet 2  . Multiple Vitamin (MULTIVITAMIN) capsule Take 1 capsule by mouth daily.    Marland Kitchen omeprazole (PRILOSEC) 40 MG capsule Take 1 capsule (40 mg total) by mouth daily. 90 capsule 2  . potassium chloride (K-DUR) 10 MEQ tablet Take 1 tablet (10 mEq total) by mouth daily. 90 tablet 2  . warfarin (COUMADIN) 2.5 MG tablet Take 1 tablet (2.5 mg total) by mouth daily for 30 days. As directed by the coumadin clinic. 30 tablet 0   No current facility-administered medications for this visit.     Review of Systems Review of Systems  Constitutional: Negative.   Respiratory: Negative.   Cardiovascular: Negative.     Blood pressure (!) 125/57, pulse 80, temperature 97.7 F (36.5 C), temperature source Skin, resp. rate 20, height '5\' 6"'  (1.676 m), weight 247 lb (112 kg), SpO2 97 %.  Physical Exam Physical Exam Exam conducted with a chaperone present.  Constitutional:      Appearance: She is well-developed.  Eyes:     General: No scleral icterus.    Conjunctiva/sclera: Conjunctivae normal.  Neck:     Musculoskeletal: Neck supple.  Cardiovascular:     Rate and Rhythm: Normal rate and regular rhythm.     Heart sounds: Normal heart sounds.     Comments: History atrial fibrillation.  Clinically in sinus rhythm today. Pulmonary:     Effort: Pulmonary effort is normal.     Breath sounds: Normal breath sounds.  Chest:     Breasts:        Right: No inverted nipple, mass, nipple discharge, skin change or tenderness.        Left: No inverted nipple, mass, nipple discharge, skin  change or tenderness.    Lymphadenopathy:     Cervical: No cervical adenopathy.     Upper Body:     Right upper body: No supraclavicular or axillary adenopathy.     Left upper body: No supraclavicular or axillary adenopathy.  Skin:    General: Skin is warm and dry.  Neurological:     Mental Status: She is alert  and oriented to person, place, and time.     Data Reviewed Bilateral screening mammograms completed February 01, 2018 at [facility] reported no interval change.  BI-RADS-2.  These films were not available for review as the [facility] PACS remote access system is down.  Assessment    No evidence of breast pathology on clinical exam.  Negative mammogram.  Blood pressure markedly improved from last year.      Plan The patient desires to continue annual screening exams through this office.    Patient will be asked to return to the office in one year with a bilateral screening mammogram [facility] . The patient is aware to call back for any questions or concerns.   HPI, Physical Exam, Assessment and Plan have been scribed under the direction and in the presence of Hervey Ard, MD.  Gaspar Cola, CMA  I have completed the exam and reviewed the above documentation for accuracy and completeness.  I agree with the above.  Haematologist has been used and any errors in dictation or transcription are unintentional.  Hervey Ard, M.D., F.A.C.S.  Leslie [name] 02/23/2018, 4:34 PM

## 2018-02-23 NOTE — Patient Instructions (Signed)
Patient will be asked to return to the office in one year with a bilateral screening mammogram [facility] . The patient is aware to call back for any questions or concerns.

## 2018-02-24 ENCOUNTER — Telehealth (HOSPITAL_COMMUNITY): Payer: Self-pay | Admitting: Emergency Medicine

## 2018-02-24 NOTE — Telephone Encounter (Signed)
Pt returned phone call--  pt verbalizes understanding of appt date/time, parking situation and where to check in, pre-test NPO status and medications ordered, and verified current allergies; name and call back number provided for further questions should they arise Marchia Bond RN Navigator Cardiac Imaging [facility] [phone] office [phone] cell

## 2018-02-24 NOTE — Telephone Encounter (Signed)
Left message on voicemail with name and callback number Letizia Hook RN Navigator Cardiac Imaging [facility] [phone] Office [phone] Cell  

## 2018-02-27 ENCOUNTER — Ambulatory Visit (HOSPITAL_COMMUNITY): Admission: RE | Admit: 2018-02-27 | Payer: BLUE CROSS/BLUE SHIELD | Source: Ambulatory Visit

## 2018-02-27 ENCOUNTER — Ambulatory Visit (HOSPITAL_COMMUNITY)
Admission: RE | Admit: 2018-02-27 | Discharge: 2018-02-27 | Disposition: A | Discharge status: Home / Self Care | Payer: BLUE CROSS/BLUE SHIELD | Source: Ambulatory Visit | Attending: Nurse Practitioner | Admitting: Nurse Practitioner

## 2018-02-27 ENCOUNTER — Encounter (HOSPITAL_COMMUNITY): Payer: Self-pay

## 2018-02-27 ENCOUNTER — Ambulatory Visit (INDEPENDENT_AMBULATORY_CARE_PROVIDER_SITE_OTHER): Payer: BLUE CROSS/BLUE SHIELD

## 2018-02-27 DIAGNOSIS — Z5181 Encounter for therapeutic drug level monitoring: Secondary | ICD-10-CM

## 2018-02-27 DIAGNOSIS — I4891 Unspecified atrial fibrillation: Secondary | ICD-10-CM | POA: Diagnosis not present

## 2018-02-27 DIAGNOSIS — R079 Chest pain, unspecified: Secondary | ICD-10-CM | POA: Diagnosis not present

## 2018-02-27 DIAGNOSIS — I050 Rheumatic mitral stenosis: Secondary | ICD-10-CM | POA: Diagnosis not present

## 2018-02-27 LAB — POCT INR: INR: 5.8 — AB (ref 2.0–3.0)

## 2018-02-27 MED ORDER — METOPROLOL TARTRATE 5 MG/5ML IV SOLN
5.0000 mg | INTRAVENOUS | Status: DC | PRN
  Administered 2018-02-27 (×4): 5 mg via INTRAVENOUS
  Filled 2018-02-27: qty 5

## 2018-02-27 MED ORDER — NITROGLYCERIN 0.4 MG SL SUBL
0.8000 mg | SUBLINGUAL_TABLET | SUBLINGUAL | Status: DC | PRN
  Administered 2018-02-27: 0.8 mg via SUBLINGUAL
  Filled 2018-02-27: qty 25

## 2018-02-27 MED ORDER — NITROGLYCERIN 0.4 MG SL SUBL
SUBLINGUAL_TABLET | SUBLINGUAL | Status: AC
  Administered 2018-02-27: 0.8 mg via SUBLINGUAL
  Filled 2018-02-27: qty 2

## 2018-02-27 MED ORDER — DILTIAZEM HCL 25 MG/5ML IV SOLN
5.0000 mg | Freq: Once | INTRAVENOUS | Status: AC
  Administered 2018-02-27: 5 mg via INTRAVENOUS
  Filled 2018-02-27: qty 5

## 2018-02-27 MED ORDER — IOPAMIDOL (ISOVUE-370) INJECTION 76%
80.0000 mL | Freq: Once | INTRAVENOUS | Status: AC | PRN
  Administered 2018-02-27: 100 mL via INTRAVENOUS

## 2018-02-27 MED ORDER — DILTIAZEM HCL 25 MG/5ML IV SOLN
INTRAVENOUS | Status: AC
  Administered 2018-02-27: 5 mg via INTRAVENOUS
  Filled 2018-02-27: qty 5

## 2018-02-27 MED ORDER — METOPROLOL TARTRATE 5 MG/5ML IV SOLN
INTRAVENOUS | Status: AC
  Administered 2018-02-27: 5 mg via INTRAVENOUS
  Filled 2018-02-27: qty 10

## 2018-02-27 NOTE — Telephone Encounter (Signed)
Forms given to SG at the front office.

## 2018-02-27 NOTE — Patient Instructions (Signed)
Please skip coumadin tomorrow & Wednesday then resume dosage of 1 tablet (2.5 mg) every day.  Please take your coumadin in the evenings.  Try to get your greens on a schedule - pick a day or days each week to have your greens and have them every week on that day.  Recheck in 1 week.

## 2018-02-27 NOTE — Telephone Encounter (Signed)
Sent completed forms to [employer] via interoffice. 

## 2018-02-27 NOTE — Telephone Encounter (Signed)
Thank you for the FYI.  I will take care of it when I see her.

## 2018-02-27 NOTE — Telephone Encounter (Signed)
Patient with diagnosis of afib on warfarin for anticoagulation.    Procedure: Right Knee Arthroscopy Date of procedure: TBD  CHADS2-VASc score of  3 (CHF, HTN, AGE, DM2, stroke/tia x 2, CAD, AGE, female)  Per office protocol, patient can hold warfarin for 3 days prior to procedure.

## 2018-02-27 NOTE — Telephone Encounter (Signed)
Patient is seeing Christell Faith PA-C in the cardiology office in 4 days. See clinical pharmacist recommendation below.   Thurmond Butts, this is a pharmacy clearance only, but can you also make sure the patient is medically stable on followup as well. Please forward a final clearance to Dr. Clydell Hakim office.

## 2018-02-28 MED ORDER — ATORVASTATIN CALCIUM 40 MG PO TABS: 40.0000 mg | ORAL_TABLET | Freq: Every day | ORAL | 3 refills | Status: DC

## 2018-02-28 NOTE — Telephone Encounter (Signed)
-----   Message from Theora Gianotti, NP sent at 02/28/2018  7:42 AM EST ----- Mild, non-obstructive CAD noted - nothing that we would expect to cause symptoms.  Calcium score is elevated, placing her in the 89th percentile for age/sex.  Some non-specific changes on non-cardiac findings, along with a small hiatal hernia.   Overall, this is good news, in the sense that she does not have any significant dzs, but she does have mild dzs.  Thus it is important that we/she do everything we can to prevent that from getting worse.  I'd like for her to increase her lipitor to 40mg  daily w/ plan for f/u lipids/lft's in 6 wks.  Though we would typically consider ASA in this setting, b/c she is on coumadin, I would not rx asa at this time.  Quitting smoking has always been important and with these findings, it is even more important.  She has a h/o sleep apnea and her pulmonary pressure was elevated.  It is crucial that she use cpap as rx.

## 2018-02-28 NOTE — Telephone Encounter (Signed)
Call to patient to review results from CT coronary.  She verbalized understanding and was agreeable to POC.   Order placed for Lipitor increase and blood work in 6 weeks.  Will route to scheduling for labs in clinic.   Advised pt to call for any further questions or concerns.

## 2018-03-01 ENCOUNTER — Encounter: Payer: Self-pay | Admitting: Adult Health

## 2018-03-01 ENCOUNTER — Other Ambulatory Visit: Payer: Self-pay

## 2018-03-01 ENCOUNTER — Ambulatory Visit (INDEPENDENT_AMBULATORY_CARE_PROVIDER_SITE_OTHER): Payer: BC Managed Care – PPO | Admitting: Adult Health

## 2018-03-01 VITALS — BP 126/68 | HR 91 | Temp 98.1°F | Resp 16 | Ht 66.0 in | Wt 247.0 lb

## 2018-03-01 DIAGNOSIS — F17200 Nicotine dependence, unspecified, uncomplicated: Secondary | ICD-10-CM | POA: Diagnosis not present

## 2018-03-01 DIAGNOSIS — R05 Cough: Secondary | ICD-10-CM

## 2018-03-01 DIAGNOSIS — I480 Paroxysmal atrial fibrillation: Secondary | ICD-10-CM | POA: Diagnosis not present

## 2018-03-01 DIAGNOSIS — J0100 Acute maxillary sinusitis, unspecified: Secondary | ICD-10-CM | POA: Diagnosis not present

## 2018-03-01 DIAGNOSIS — R059 Cough, unspecified: Secondary | ICD-10-CM

## 2018-03-01 DIAGNOSIS — B373 Candidiasis of vulva and vagina: Secondary | ICD-10-CM

## 2018-03-01 DIAGNOSIS — B3731 Acute candidiasis of vulva and vagina: Secondary | ICD-10-CM

## 2018-03-01 MED ORDER — HYDROCOD POLST-CPM POLST ER 10-8 MG/5ML PO SUER: 5.0000 mL | Freq: Two times a day (BID) | ORAL | 0 refills | Status: DC | PRN

## 2018-03-01 MED ORDER — SULFAMETHOXAZOLE-TRIMETHOPRIM 800-160 MG PO TABS: 1.0000 | ORAL_TABLET | Freq: Two times a day (BID) | ORAL | 0 refills | Status: DC

## 2018-03-01 MED ORDER — FLUCONAZOLE 150 MG PO TABS: 150.0000 mg | ORAL_TABLET | ORAL | 0 refills | Status: DC | PRN

## 2018-03-01 NOTE — Progress Notes (Signed)
[facility] [place], [place] [address]  Internal MEDICINE  Office Visit Note  Patient Name: Leslie [name]  [id number]  [id number]  Date of Service: 03/01/2018  Chief Complaint  Patient presents with  . Wheezing    started a couple of days ago  . Cough     HPI Pt is here for a sick visit.  Patient reports 3 days of feeling bad.  Her symptoms include body aches, chills, cough, headache, sore throat, sinus pressure and ear pressure.  She also reports postnasal drip.  She states that there are multiple people in her office who have been sick.  She herself is recovering from pneumonia where 3 weeks ago she received Levaquin.  She has continued to use her albuterol inhaler and generally has been doing well until the symptoms started.      Current Medication:  Outpatient Encounter Medications as of 03/01/2018  Medication Sig Note  . albuterol (PROVENTIL HFA;VENTOLIN HFA) 108 (90 Base) MCG/ACT inhaler Inhale 2 puffs into the lungs every 4 (four) hours as needed for wheezing or shortness of breath.   . ALPRAZolam (XANAX) 0.5 MG tablet Take 1 tablet (0.5 mg total) by mouth at bedtime as needed for anxiety.   Marland Kitchen atorvastatin (LIPITOR) 40 MG tablet Take 1 tablet (40 mg total) by mouth daily at 6 PM.   . diltiazem (CARDIZEM CD) 240 MG 24 hr capsule Take 1 capsule (240 mg total) by mouth daily.   Marland Kitchen estradiol (ESTRACE) 0.5 MG tablet Take 1 tablet (0.5 mg total) by mouth daily.   . furosemide (LASIX) 20 MG tablet Take 1 tablet (20 mg total) by mouth daily as needed. For weight gain greater than 2 lb in 24 hours and 5 lb in a week. (Patient taking differently: Take 20 mg by mouth daily as needed for edema. For weight gain greater than 2 lb in 24 hours and 5 lb in a week.)   . metoprolol succinate (TOPROL-XL) 25 MG 24 hr tablet Take 3 tablets (75 mg total) by mouth daily. Take with or immediately following a meal.   . metoprolol succinate (TOPROL-XL) 50 MG 24 hr tablet  Take 50 mg by mouth daily. Take with or immediately following a meal.   . montelukast (SINGULAIR) 10 MG tablet Take 1 tablet (10 mg total) by mouth at bedtime.   . naproxen sodium (ALEVE) 220 MG tablet Take 440 mg by mouth daily as needed (pain).   Marland Kitchen omeprazole (PRILOSEC) 40 MG capsule Take 1 capsule (40 mg total) by mouth daily.   . potassium chloride (K-DUR) 10 MEQ tablet Take 1 tablet (10 mEq total) by mouth daily.   Marland Kitchen warfarin (COUMADIN) 2.5 MG tablet Take 1 tablet (2.5 mg total) by mouth daily for 30 days. As directed by the coumadin clinic. (Patient taking differently: Take 2.5 mg by mouth daily. ) 02/28/2018: On hold until 2/14 due to elevated INR   . chlorpheniramine-HYDROcodone (TUSSIONEX PENNKINETIC ER) 10-8 MG/5ML SUER Take 5 mLs by mouth every 12 (twelve) hours as needed for cough.   . fluconazole (DIFLUCAN) 150 MG tablet Take 1 tablet (150 mg total) by mouth every three (3) days as needed.   . sulfamethoxazole-trimethoprim (BACTRIM DS,SEPTRA DS) 800-160 MG tablet Take 1 tablet by mouth 2 (two) times daily.   . [DISCONTINUED] fluconazole (DIFLUCAN) 150 MG tablet Take 1 tablet po once. May repeat dose in 3 days as needed for persistent symptoms. (Patient not taking: Reported on 02/27/2018)   . [  DISCONTINUED] levofloxacin (LEVAQUIN) 500 MG tablet Take 1 tablet (500 mg total) by mouth daily. (Patient not taking: Reported on 02/27/2018)    No facility-administered encounter medications on file as of 03/01/2018.       Medical History: Past Medical History:  Diagnosis Date  . (HFpEF) heart failure with preserved ejection fraction ([place])    a. 08/2017 Echo: EF 55-60%.  Grade 2 diastolic dysfunction.  Moderate mitral stenosis.  . Allergy   . Anemia   . Anxiety   . BRCA negative 03/22/2013  . Bronchitis 02/19/2016   ON LEVAQUIN PO  . Chest tightness   . Cigarette smoker 09/11/2017   8-10 day  . GERD (gastroesophageal reflux disease)   . History of kidney stones   . Hyperlipidemia   .  Hypertension   . Moderate mitral stenosis    a.  08/2017 TEE: EF 60 to 65%.  Moderate mitral stenosis.  Mean gradient 14 mmHg.  Valve area 2.59 cm by planimetry, 2.72 cm by pressure half-time.  . Palpitations   . Persistent atrial fibrillation    a. 08/2017 s/p TEE/DCCV; b. CHA2DS2VASc = 2-->warfarin.     Vital Signs: BP 126/68   Pulse 91   Temp 98.1 F (36.7 C)   Resp 16   Ht '5\' 6"'  (1.676 m)   Wt 247 lb (112 kg)   SpO2 94%   BMI 39.87 kg/m    Review of Systems  Constitutional: Positive for chills and fever. Negative for fatigue and unexpected weight change.  HENT: Positive for postnasal drip, sinus pressure, sinus pain and sore throat. Negative for congestion, rhinorrhea and sneezing.   Eyes: Negative for photophobia, pain and redness.  Respiratory: Positive for cough and wheezing. Negative for chest tightness and shortness of breath.   Cardiovascular: Negative for chest pain and palpitations.  Gastrointestinal: Negative for abdominal pain, constipation, diarrhea, nausea and vomiting.  Endocrine: Negative.   Genitourinary: Negative for dysuria and frequency.  Musculoskeletal: Negative for arthralgias, back pain, joint swelling and neck pain.  Skin: Negative for rash.  Allergic/Immunologic: Negative.   Neurological: Negative for tremors and numbness.  Hematological: Negative for adenopathy. Does not bruise/bleed easily.  Psychiatric/Behavioral: Negative for behavioral problems and sleep disturbance. The patient is not nervous/anxious.     Physical Exam Vitals signs and nursing note reviewed.  Constitutional:      General: She is not in acute distress.    Appearance: She is well-developed. She is not diaphoretic.  HENT:     Head: Normocephalic and atraumatic.     Comments: Tenderness with palpation over maxillary sinus.    Mouth/Throat:     Pharynx: No oropharyngeal exudate.  Eyes:     Pupils: Pupils are equal, round, and reactive to light.  Neck:      Musculoskeletal: Normal range of motion and neck supple.     Thyroid: No thyromegaly.     Vascular: No JVD.     Trachea: No tracheal deviation.  Cardiovascular:     Rate and Rhythm: Normal rate and regular rhythm.     Heart sounds: Normal heart sounds. No murmur. No friction rub. No gallop.   Pulmonary:     Effort: Pulmonary effort is normal. No respiratory distress.     Breath sounds: Normal breath sounds. No wheezing or rales.  Chest:     Chest wall: No tenderness.  Abdominal:     Palpations: Abdomen is soft.     Tenderness: There is no abdominal tenderness. There is no guarding.  Musculoskeletal: Normal range of motion.  Lymphadenopathy:     Cervical: No cervical adenopathy.  Skin:    General: Skin is warm and dry.  Neurological:     Mental Status: She is alert and oriented to person, place, and time.     Cranial Nerves: No cranial nerve deficit.  Psychiatric:        Behavior: Behavior normal.        Thought Content: Thought content normal.        Judgment: Judgment normal.    Assessment/Plan: 1. Acute non-recurrent maxillary sinusitis Patient prescribed a course of Bactrim for sinusitis.  Instructed patient return to clinic in 7 to 10 days if symptoms fail to improve.  She may continue to use over-the-counter medication for symptom management.  Rest and drink plenty fluids. - sulfamethoxazole-trimethoprim (BACTRIM DS,SEPTRA DS) 800-160 MG tablet; Take 1 tablet by mouth 2 (two) times daily.  Dispense: 14 tablet; Refill: 0  2. Tobacco dependency Smoking cessation counseling: 1. Pt acknowledges the risks of long term smoking, she will try to quite smoking. 2. Options for different medications including nicotine products, chewing gum, patch etc, Wellbutrin and Chantix is discussed 3. Goal and date of compete cessation is discussed 4. Total time spent in smoking cessation is 15 min.  3. Paroxysmal atrial fibrillation (HCC) Stable continue neurology as scheduled.  4. Vaginal  candidiasis Patient to combat likely yeast infection from antibiotic use. - fluconazole (DIFLUCAN) 150 MG tablet; Take 1 tablet (150 mg total) by mouth every three (3) days as needed.  Dispense: 3 tablet; Refill: 0  5. Cough - chlorpheniramine-HYDROcodone (TUSSIONEX PENNKINETIC ER) 10-8 MG/5ML SUER; Take 5 mLs by mouth every 12 (twelve) hours as needed for cough.  Dispense: 115 mL; Refill: 0 Reviewed risks and possible side effects associated with taking opiates, benzodiazepines and other CNS depressants. Combination of these could cause dizziness and drowsiness. Advised patient not to drive or operate machinery when taking these medications, as patient's and other's life can be at risk and will have consequences. Patient verbalized understanding in this matter. Dependence and abuse for these drugs will be monitored closely. A Controlled substance policy and procedure is on file which allows [facility] to order a urine drug screen test at any visit. Patient understands and agrees with the plan  General Counseling: Jeanet verbalizes understanding of the findings of todays visit and agrees with plan of treatment. I have discussed any further diagnostic evaluation that may be needed or ordered today. We also reviewed her medications today. she has been encouraged to call the office with any questions or concerns that should arise related to todays visit.   No orders of the defined types were placed in this encounter.   Meds ordered this encounter  Medications  . sulfamethoxazole-trimethoprim (BACTRIM DS,SEPTRA DS) 800-160 MG tablet    Sig: Take 1 tablet by mouth 2 (two) times daily.    Dispense:  14 tablet    Refill:  0  . fluconazole (DIFLUCAN) 150 MG tablet    Sig: Take 1 tablet (150 mg total) by mouth every three (3) days as needed.    Dispense:  3 tablet    Refill:  0  . chlorpheniramine-HYDROcodone (TUSSIONEX PENNKINETIC ER) 10-8 MG/5ML SUER    Sig: Take 5 mLs by mouth every  12 (twelve) hours as needed for cough.    Dispense:  115 mL    Refill:  0    Time spent: 25  Minutes  This patient was seen  by Orson Gear AGNP-C in Collaboration with Dr Lavera Guise as a part of collaborative care agreement.  Kendell Bane AGNP-C Internal Medicine

## 2018-03-02 NOTE — Progress Notes (Signed)
Cardiology Office Note Date:  03/03/2018  Patient ID:  Leslie [name], Leslie [name] [birth year], MRN [id number] PCP:  Ronnell Freshwater, NP  Cardiologist:  Dr. Rockey Situ, MD    Chief Complaint: Follow-up  History of Present Illness: Leslie [name] is a 53 y.o. female with history of nonobstructive CAD by cardiac CT as outlined below in 02/2018, PAF on Coumadin, chronic diastolic CHF, moderate mitral stenosis, hypertension, hyperlipidemia, tobacco abuse, obesity, sleep apnea, and anxiety who presents for follow-up of A. fib and chronic diastolic CHF.  Patient's A. fib was diagnosed in 08/2016 in the setting of hospitalization for chest tightness.  She underwent TEE which showed moderate mitral stenosis.  She was subsequently cardioverted.  She has been anticoagulated on Coumadin in the setting of a valvular A. fib.  She was seen in the office on 01/06/2018 noting she had been doing well up until 01/05/2018 when she awoke suddenly with tachypalpitations and heart rates in the 130s beats per minute.  EMS was called to her house, upon their arrival the patient was feeling somewhat better and was noted to be in sinus rhythm with heart rates in the 80s to 90s bpm.  She continued to note symptoms concerning for recurrence of A. fib prompting her to be seen on 12/20.  EKG at that time showed sinus rhythm.  Her diltiazem was increased to 240 mg daily.  In the setting of chest tightness associated with her palpitations cardiac CT was ordered, which showed distal left main 0 to 25% calcification, 0 to 25% LAD calcification, trivial plaque within the LCx.  Calcium score of 10 which was the 89th percentile for age and sex.  Dilated pulmonary artery measuring 35 mm suggestive of pulmonary hypertension.   Outpatient cardiac monitoring given her palpitations showed  NSR, average heart rate 82 bpm, 5 SVT/atrial tachycardia runs occurred with the fastest interval lasting 5 beats with a maximal rate of 145 bpm and the longest  interval lasting 16 beats with an average rate of 114 bpm.  Patient triggered events were not associated with significant arrhythmia.  She was seen in the ED on 01/17/2018 with continued intermittent palpitations and chest pain.    Work-up was unrevealing outside of a potassium of 2.8.  Outpatient follow-up was recommended.  She was seen in follow-up on 02/03/2018 and noted no further palpitations.  She was noted to have suffered a mechanical fall 3 weeks prior with subsequent large hematoma along the left buttock and left lateral thigh.  Given this, she underwent CT of the left lower extremity which showed a left-sided subcutaneous hematoma overlying the gluteal muscles measuring 11.5 x 6.3 cm.  She was seen again in the ED on 1/21 noting a nonproductive cough, congestion, and scratchy throat.  In this setting, she developed palpitations with associated shortness of breath.  Work-up showed a troponin of less than 0.032, blood culture negative x2, influenza negative, potassium 3.8, serum creatinine 1.3, WBC 6.9, Hgb 10.3, BNP 269, chest x-ray showed superimposed interstitial markings.  She was treated with IV antibiotics and the ED followed by outpatient course of Levaquin for presumed pneumonia and advised to follow-up with her PCP.  She was continued on home dose of Lasix.  She comes in doing reasonably well from a cardiac perspective.  She has not noted any further palpitations.  No chest pain.  She continues to note some shortness of breath with chest congestion and cough productive of yellow sputum.  She was recently seen by PCP and  placed on Bactrim.  Her weight is up 5 pounds from her last office visit.  It was noted above at the time of her cardiac CT being resulted, that she should be compliant with her CPAP.  Patient has never undergone a sleep study and does not have a CPAP.  She reports her husband has told her that she does snore.  She notes no lower extremity swelling, stable two-pillow orthopnea, no  abdominal distention, no PND, no early satiety.  No further falls.  Her hematoma is improving and less sore.  She is taking Lasix 20 mg daily along with KCl 10 mEq daily.  Most recent INR of 5.8 from 02/27/2018.   Past Medical History:  Diagnosis Date  . (HFpEF) heart failure with preserved ejection fraction ([place])    a. 08/2017 Echo: EF 55-60%.  Grade 2 diastolic dysfunction.  Moderate mitral stenosis.  . Allergy   . Anemia   . Anxiety   . BRCA negative 03/22/2013  . Bronchitis 02/19/2016   ON LEVAQUIN PO  . Chest tightness   . CHF (congestive heart failure) ([place])   . Cigarette smoker 09/11/2017   8-10 day  . GERD (gastroesophageal reflux disease)   . History of kidney stones   . Hyperlipidemia   . Hypertension   . Moderate mitral stenosis    a.  08/2017 TEE: EF 60 to 65%.  Moderate mitral stenosis.  Mean gradient 14 mmHg.  Valve area 2.59 cm by planimetry, 2.72 cm by pressure half-time.  . Palpitations   . Persistent atrial fibrillation    a. 08/2017 s/p TEE/DCCV; b. CHA2DS2VASc = 2-->warfarin.    Past Surgical History:  Procedure Laterality Date  . ABDOMINAL HYSTERECTOMY     total  . ABDOMINAL HYSTERECTOMY    . BREAST BIOPSY Bilateral 2012  . BREAST BIOPSY Right 08-07-12   fibroadenomatous changes and columnar cells  . BREAST BIOPSY  02/03/2015   stereo byrnett  . CHOLECYSTECTOMY N/A 02/27/2016   Procedure: LAPAROSCOPIC CHOLECYSTECTOMY;  Surgeon: Christene Lye, MD;  Location: [facility] ORS;  Service: General;  Laterality: N/A;  . JOINT REPLACEMENT Left   . KNEE CLOSED REDUCTION Left 04/15/2015   Procedure: CLOSED MANIPULATION KNEE;  Surgeon: Thornton Park, MD;  Location: [facility] ORS;  Service: Orthopedics;  Laterality: Left;  . KNEE SURGERY    . TEE WITHOUT CARDIOVERSION N/A 09/13/2017   Procedure: TRANSESOPHAGEAL ECHOCARDIOGRAM (TEE);  Surgeon: Nelva Bush, MD;  Location: [facility] ORS;  Service: Cardiovascular;  Laterality: N/A;  . TOTAL KNEE ARTHROPLASTY Left 12/25/2014     Procedure: TOTAL KNEE ARTHROPLASTY;  Surgeon: Thornton Park, MD;  Location: [facility] ORS;  Service: Orthopedics;  Laterality: Left;  . TUBAL LIGATION      Current Meds  Medication Sig  . albuterol (PROVENTIL HFA;VENTOLIN HFA) 108 (90 Base) MCG/ACT inhaler Inhale 2 puffs into the lungs every 4 (four) hours as needed for wheezing or shortness of breath.  . ALPRAZolam (XANAX) 0.5 MG tablet Take 1 tablet (0.5 mg total) by mouth at bedtime as needed for anxiety.  Marland Kitchen atorvastatin (LIPITOR) 40 MG tablet Take 1 tablet (40 mg total) by mouth daily at 6 PM.  . chlorpheniramine-HYDROcodone (TUSSIONEX PENNKINETIC ER) 10-8 MG/5ML SUER Take 5 mLs by mouth every 12 (twelve) hours as needed for cough.  . diltiazem (CARDIZEM CD) 240 MG 24 hr capsule Take 1 capsule (240 mg total) by mouth daily.  Marland Kitchen estradiol (ESTRACE) 0.5 MG tablet Take 1 tablet (0.5 mg total) by mouth daily.  . fluconazole (DIFLUCAN)  150 MG tablet Take 1 tablet (150 mg total) by mouth every three (3) days as needed.  . furosemide (LASIX) 20 MG tablet Take 1 tablet (20 mg total) by mouth daily.  . metoprolol succinate (TOPROL-XL) 25 MG 24 hr tablet Take 3 tablets (75 mg total) by mouth daily. Take with or immediately following a meal.  . montelukast (SINGULAIR) 10 MG tablet Take 1 tablet (10 mg total) by mouth at bedtime.  . naproxen sodium (ALEVE) 220 MG tablet Take 440 mg by mouth daily as needed (pain).  Marland Kitchen omeprazole (PRILOSEC) 40 MG capsule Take 1 capsule (40 mg total) by mouth daily.  . potassium chloride (K-DUR) 10 MEQ tablet Take 1 tablet (10 mEq total) by mouth daily.  Marland Kitchen sulfamethoxazole-trimethoprim (BACTRIM DS,SEPTRA DS) 800-160 MG tablet Take 1 tablet by mouth 2 (two) times daily.  Marland Kitchen warfarin (COUMADIN) 2.5 MG tablet Take 1 tablet (2.5 mg total) by mouth daily for 30 days. As directed by the coumadin clinic. (Patient taking differently: Take 2.5 mg by mouth daily. )  . [DISCONTINUED] furosemide (LASIX) 20 MG tablet Take 1 tablet (20 mg  total) by mouth daily as needed. For weight gain greater than 2 lb in 24 hours and 5 lb in a week. (Patient taking differently: Take 20 mg by mouth daily as needed for edema. For weight gain greater than 2 lb in 24 hours and 5 lb in a week.)    Allergies:   Morphine; Oxycodone hcl; and Dilaudid [hydromorphone hcl]   Social History:  The patient  reports that she has been smoking cigarettes. She has a 10.00 pack-year smoking history. She has never used smokeless tobacco. She reports current alcohol use of about 2.0 standard drinks of alcohol per week. She reports that she does not use drugs.   Family History:  The patient's family history includes Cancer in her maternal aunt and maternal grandmother; Cancer (age of onset: 69) in her mother.  ROS:   Review of Systems  Constitutional: Positive for chills and malaise/fatigue. Negative for diaphoresis, fever and weight loss.  HENT: Positive for congestion and sore throat.   Eyes: Negative for discharge and redness.  Respiratory: Positive for sputum production, shortness of breath and wheezing. Negative for cough and hemoptysis.        Cough productive of yellow sputum  Cardiovascular: Negative for chest pain, palpitations, orthopnea, claudication, leg swelling and PND.  Gastrointestinal: Negative for abdominal pain, blood in stool, heartburn, melena, nausea and vomiting.  Genitourinary: Negative for hematuria.  Musculoskeletal: Negative for falls and myalgias.  Skin: Negative for rash.  Neurological: Positive for weakness. Negative for dizziness, tingling, tremors, sensory change, speech change, focal weakness and loss of consciousness.  Endo/Heme/Allergies: Does not bruise/bleed easily.  Psychiatric/Behavioral: Negative for substance abuse. The patient is not nervous/anxious.   All other systems reviewed and are negative.    PHYSICAL EXAM:  VS:  BP 110/60 (BP Location: Left Arm, Patient Position: Sitting, Cuff Size: Normal)   Pulse 80   Ht  _0  (1.549 m)   Wt 247 lb 8 oz (112.3 kg)   BMI 46.76 kg/m  BMI: Body mass index is 46.76 kg/m.  Physical Exam  Constitutional: She is oriented to person, place, and time. She appears well-developed and well-nourished.  HENT:  Head: Normocephalic and atraumatic.  Eyes: Right eye exhibits no discharge. Left eye exhibits no discharge.  Neck: Normal range of motion. No JVD present.  Cardiovascular: Normal rate, regular rhythm, S1 normal and S2 normal. Exam  reveals no distant heart sounds, no friction rub, no midsystolic click and no opening snap.  Murmur heard.  Low-pitched rumbling crescendo presystolic murmur is present with a grade of 2/6 at the apex. Pulses:      Posterior tibial pulses are 2+ on the right side and 2+ on the left side.  Pulmonary/Chest: Effort normal. No respiratory distress. She has no decreased breath sounds. She has wheezes. She has no rales. She exhibits no tenderness.  Coarse breath sounds throughout  Abdominal: Soft. She exhibits no distension. There is no abdominal tenderness.  Musculoskeletal:        General: No edema.  Neurological: She is alert and oriented to person, place, and time.  Skin: Skin is warm and dry. No cyanosis. Nails show no clubbing.  Psychiatric: She has a normal mood and affect. Her speech is normal and behavior is normal. Judgment and thought content normal.     EKG:  Was ordered and interpreted by me today. Shows NSR, 80 bpm, normal axis, no acute ST-T changes  Recent Labs: 09/23/2017: ALT 36 01/06/2018: Magnesium 1.9 01/13/2018: TSH 0.411 02/07/2018: B Natriuretic Peptide 269.0; BUN 23; Creatinine, Ser 1.30; Hemoglobin 10.3; Platelets 327; Potassium 3.8; Sodium 136  09/23/2017: Chol/HDL Ratio 4.6; Cholesterol, Total 175; HDL 38; LDL Calculated 110; Triglycerides 136   CrCl cannot be calculated (Patient's most recent lab result is older than the maximum 21 days allowed.).   Wt Readings from Last 3 Encounters:  03/03/18 246 lb 12.8  oz (111.9 kg)  03/03/18 247 lb 8 oz (112.3 kg)  03/01/18 247 lb (112 kg)     Other studies reviewed: Additional studies/records reviewed today include: summarized above  ASSESSMENT AND PLAN:  1. Nonobstructive CAD: No symptoms concerning for angina.  Recent nonobstructive cardiac CT as outlined above.  Continue Coumadin in place of aspirin.  Continue higher dose of Lipitor along with Toprol-XL.  Aggressive risk factor modification including complete tobacco cessation is recommended.  2. PAF: Maintaining sinus rhythm.  Recent outpatient cardiac monitoring demonstrated no evidence of A. fib.  Continue Toprol-XL 75 mg daily along with Cardizem CD 240 mg daily.  Continue Coumadin per Coumadin clinic.  Most recent INR was supratherapeutic at 5.8.  Follow-up with Coumadin clinic as directed.  Recent CBC demonstrated improving hemoglobin.  3. SVT: Noted on recent outpatient cardiac monitoring.  Continue increased dose of Toprol-XL and Cardizem CD as outlined above.  4. Acute on chronic diastolic CHF/pulmonary hypertension: She appears mildly volume overloaded today.  In this setting we will increase her Lasix to 20 mg twice daily for 3 days along with KCl 10 mEq twice daily for 3 days.  Following this, she will go back to Lasix 20 mg daily along with KCl 10 mEq daily.  She has been referred to pulmonology for outpatient sleep study given her pulmonary hypertension as outlined below.  Complete smoking cessation is also recommended.  5. Mitral stenosis: Follow-up echo in fall 2020.  6. Leg hematoma: Improving.  7. Hyperlipidemia: Most recent LDL of 110 from 09/2017.  Continue Lipitor 40 mg daily.  She will need recheck fasting lipid and liver function in late 03/2017.  8. Obesity with sleep disordered breathing: Refer to pulmonology for outpatient sleep study.  This, coupled with possible underlying COPD, is likely driving her pulmonary hypertension.  9. Possible COPD exacerbation/bronchitis: Continue  previously prescribed Bactrim and albuterol inhaler as directed.  Start prednisone taper 60, 50, 40, 30, 20, 10 mg daily.  Follow-up with PCP as directed.  Disposition: F/u with Dr. Rockey Situ or an APP in 1 month.  Current medicines are reviewed at length with the patient today.  The patient did not have any concerns regarding medicines.  Signed, Christell Faith, PA-C 03/03/2018 11:54 AM     [facility] [address] [place] [address] [place], [place] [address] [phone]

## 2018-03-03 ENCOUNTER — Other Ambulatory Visit: Payer: Self-pay

## 2018-03-03 ENCOUNTER — Telehealth: Payer: Self-pay

## 2018-03-03 ENCOUNTER — Ambulatory Visit (INDEPENDENT_AMBULATORY_CARE_PROVIDER_SITE_OTHER): Payer: BLUE CROSS/BLUE SHIELD | Admitting: Physician Assistant

## 2018-03-03 ENCOUNTER — Encounter
Admission: RE | Admit: 2018-03-03 | Discharge: 2018-03-03 | Disposition: A | Discharge status: Home / Self Care | Payer: BLUE CROSS/BLUE SHIELD | Source: Ambulatory Visit | Attending: Orthopedic Surgery | Admitting: Orthopedic Surgery

## 2018-03-03 ENCOUNTER — Encounter: Payer: Self-pay | Admitting: Physician Assistant

## 2018-03-03 VITALS — BP 110/60 | HR 80 | Ht 61.0 in | Wt 247.5 lb

## 2018-03-03 DIAGNOSIS — I2510 Atherosclerotic heart disease of native coronary artery without angina pectoris: Secondary | ICD-10-CM

## 2018-03-03 DIAGNOSIS — Z01812 Encounter for preprocedural laboratory examination: Secondary | ICD-10-CM | POA: Insufficient documentation

## 2018-03-03 DIAGNOSIS — G4730 Sleep apnea, unspecified: Secondary | ICD-10-CM

## 2018-03-03 DIAGNOSIS — I5033 Acute on chronic diastolic (congestive) heart failure: Secondary | ICD-10-CM | POA: Diagnosis not present

## 2018-03-03 DIAGNOSIS — I050 Rheumatic mitral stenosis: Secondary | ICD-10-CM

## 2018-03-03 DIAGNOSIS — E782 Mixed hyperlipidemia: Secondary | ICD-10-CM

## 2018-03-03 DIAGNOSIS — I471 Supraventricular tachycardia: Secondary | ICD-10-CM

## 2018-03-03 DIAGNOSIS — T148XXA Other injury of unspecified body region, initial encounter: Secondary | ICD-10-CM

## 2018-03-03 DIAGNOSIS — I272 Pulmonary hypertension, unspecified: Secondary | ICD-10-CM | POA: Diagnosis not present

## 2018-03-03 DIAGNOSIS — I480 Paroxysmal atrial fibrillation: Secondary | ICD-10-CM

## 2018-03-03 DIAGNOSIS — J40 Bronchitis, not specified as acute or chronic: Secondary | ICD-10-CM

## 2018-03-03 HISTORY — DX: Heart failure, unspecified: I50.9

## 2018-03-03 MED ORDER — FUROSEMIDE 20 MG PO TABS: 20.0000 mg | ORAL_TABLET | Freq: Every day | ORAL | 3 refills | Status: DC

## 2018-03-03 MED ORDER — PREDNISONE 10 MG PO TABS: ORAL_TABLET | ORAL | 0 refills | Status: DC

## 2018-03-03 NOTE — Telephone Encounter (Signed)
Call to Ms. Najarian regarding surgical clearance. No answer. LMOM.

## 2018-03-03 NOTE — Pre-Procedure Instructions (Addendum)
Faxed recommendation for medical clearance to Dr. Marry Guan. Fax confirmation received.

## 2018-03-03 NOTE — Patient Instructions (Signed)
Medication Instructions:  Your physician has recommended you make the following change in your medication:  1- Start Prednisone taper Take prednisone taper for 6 days: Today take 6 tablets (60 mg total), tomorrow take 5 tablets (50 mg total), Sunday take 4 tablets (40 mg total), Monday take 3 tablets (30 mg total), Tuesday take 2 tablets (20 mg total), then Wednesday take 1 tablet (10 mg total). 2- INCREASE Lasix to 1 tablet (20 mg) twice daily for 3 days, then continue taking 1 tablet (20 mg) once daily thereafter  3- INCREASE Potassium Chloride to 1 tablet (10 mEq) twice daily for 3 days, then take 1 tablet (10 mEq total) once per day. If you need a refill on your cardiac medications before your next appointment, please call your pharmacy.   Lab work: Return for clinic lab draw next Monday for BMP.  If you have labs (blood work) drawn today and your tests are completely normal, you will receive your results only by: Marland Kitchen MyChart Message (if you have MyChart) OR . A paper copy in the mail If you have any lab test that is abnormal or we need to change your treatment, we will call you to review the results.  Testing/Procedures: None ordered   Follow-Up: At [facility], you and your health needs are our priority.  As part of our continuing mission to provide you with exceptional heart care, we have created designated Provider Care Teams.  These Care Teams include your primary Cardiologist (physician) and Advanced Practice Providers (APPs -  Physician Assistants and Nurse Practitioners) who all work together to provide you with the care you need, when you need it. You will need a follow up appointment in 1 months. You may see Ida Rogue, MD or Christell Faith, PA-C.  Any Other Special Instructions Will Be Listed Below (If Applicable). Referral to Pulmonology for sleep study, they should call you for appt in the next week, if not please call them @ [phone].

## 2018-03-03 NOTE — Telephone Encounter (Signed)
Call to [facility] to discuss surgical clearance note from provider. She verbalized understanding and asked that I make surgeon aware.   Faxed Ryans note to Dr. Skip Estimable.  Advised them to call with any other questions.

## 2018-03-03 NOTE — Telephone Encounter (Signed)
Leslie [name] calling back States that best number to contact at this time is [phone] Please advise

## 2018-03-03 NOTE — Patient Instructions (Signed)
Your procedure is scheduled on: Wednesday 03/08/18  Report to [place]. To find out your arrival time please call [phone] between 1PM - 3PM on Tuesday 03/07/18.   Remember: Instructions that are not followed completely may result in serious medical risk, up to and including death, or upon the discretion of your surgeon and anesthesiologist your surgery may need to be rescheduled.      _X__ 1. Do not eat food after midnight the night before your procedure.                 No gum chewing or hard candies. You may drink clear liquids up to 2 hours                 before you are scheduled to arrive for your surgery- DO NOT drink clear                 liquids within 2 hours of the start of your surgery.                 Clear Liquids include:  water, apple juice without pulp, clear carbohydrate                 drink such as Clearfast or Gatorade, Black Coffee or Tea (Do not add                 milk or creamer to coffee or tea).   __X__2.  On the morning of surgery brush your teeth with toothpaste and water, you may rinse your mouth with mouthwash if you wish.  Do not swallow any toothpaste or mouthwash.      _X__ 3.  No Alcohol for 24 hours before or after surgery.    _X__ 4.  Do Not Smoke or use e-cigarettes For 24 Hours Prior to Your Surgery.                 Do not use any chewable tobacco products for at least 6 hours prior to                 surgery.   __X__5.  Notify your doctor if there is any change in your medical condition      (cold, fever, infections).     Do not wear jewelry, make-up, hairpins, clips or nail polish. Do not wear lotions, powders, or perfumes.  Do not shave 48 hours prior to surgery. Men may shave face and neck. Do not bring valuables to the hospital.    [facility] is not responsible for any belongings or valuables.  Contacts, dentures/partials or body piercings may not be worn into surgery.  Bring a case for your contacts, glasses or hearing aids, a denture cup will be supplied.    Patients discharged the day of surgery will not be allowed to drive home.    Please read over the following fact sheets that you were given:   MRSA Information   __X__ Take these medicines the morning of surgery with A SIP OF WATER:     1. diltiazem (CARDIZEM CD) 240 MG 24 hr capsule  2. estradiol (ESTRACE) 0.5 MG tablet  3. metoprolol succinate (TOPROL-XL) 25 MG 24 hr tablet  4. omeprazole (PRILOSEC) 40 MG capsule take the night before and the morning of.  5. predniSONE (DELTASONE) 10 MG tablet  6. albuterol (PROVENTIL HFA;VENTOLIN HFA) 108 (90 Base) MCG/ACT inhaler    __X__ Use CHG  Soap as directed   _ X___ Use inhalers on the day of surgery. Also bring the inhaler with you to the hospital on the morning of surgery.   __X__ Stop Blood Thinners Coumadin: Per pharmacy clearance note in Epic on 02/27/18 you can hold your Warfarin/Coumadin for 3 days prior to your surgery.   __X__ Stop Anti-inflammatories 7 days before surgery such as Advil, Ibuprofen, Motrin, BC or Goodies Powder, Naprosyn, Naproxen, Aleve, Aspirin, Meloxicam. May take Tylenol if needed for pain or discomfort.    __X__ Do not start any new herbal supplements before your surgery.

## 2018-03-03 NOTE — Telephone Encounter (Signed)
Heather Silver from pre admit calling States that patient is having surgery on 2/19 and is currently having pre admit interview Would like to clarify since patient had been seen by R. Dunn today  Is patient cleared to stop medications and for surgery  Please call to discuss, best number is [phone]

## 2018-03-03 NOTE — Telephone Encounter (Signed)
Routed to [employer], [place] for further advice.

## 2018-03-03 NOTE — Telephone Encounter (Signed)
Patient was seen in the office today and denied any symptoms concerning for ischemia.  Recent cardiac CT showed nonobstructive coronary artery disease.  She was felt to be mildly volume overloaded with plans for outpatient diuresis over the upcoming weekend.  She was also treated for possible bronchitis/COPD exacerbation with steroids.  Of note, the patient has a history of A. fib on Coumadin undergoing prior cardioversion in 2018.  Most recent INR through the Coumadin clinic noted to be 5.8 on 02/27/2018.  With regards to the patient's risk stratification for noncardiac surgery, per revised cardiac index, she would be low risk with a 0.9% risk of adverse event in the perioperative timeframe.  She does not need any further ischemic testing at this time, however she will need further evaluation with regards to her supratherapeutic INR which should be undertaken by the Coumadin clinic as well as improvement in her respiratory status both from a COPD exacerbation/bronchitis perspective as well as mild volume overload.  In this setting, it may be best to delay her procedure until her breathing status improves and her INR is acceptable.  I will defer this decision to the surgical team.

## 2018-03-07 NOTE — Telephone Encounter (Signed)
Scheduled labs 3/19 .  Patient seeing Leslie [name] 3/23

## 2018-03-08 ENCOUNTER — Ambulatory Visit
Admission: RE | Admit: 2018-03-08 | Payer: BLUE CROSS/BLUE SHIELD | Source: Home / Self Care | Admitting: Orthopedic Surgery

## 2018-03-08 ENCOUNTER — Encounter: Admission: RE | Payer: Self-pay | Source: Home / Self Care

## 2018-03-08 SURGERY — ARTHROSCOPY, KNEE
Anesthesia: Choice | Laterality: Right

## 2018-03-08 NOTE — Telephone Encounter (Signed)
Call to Ms. Bielinski to make her aware that Christell Faith, PA felt at this time that cardiac risk was moderate. Information was forwarded to Dr. Marry Guan to decide to proceed with surgery.   She verbalized understanding and had no further questions at this time.

## 2018-03-16 ENCOUNTER — Ambulatory Visit
Admission: RE | Admit: 2018-03-16 | Discharge: 2018-03-16 | Disposition: A | Discharge status: Home / Self Care | Payer: BLUE CROSS/BLUE SHIELD | Attending: Nurse Practitioner | Admitting: Nurse Practitioner

## 2018-03-16 ENCOUNTER — Ambulatory Visit: Payer: BC Managed Care – PPO | Admitting: Nurse Practitioner

## 2018-03-16 ENCOUNTER — Ambulatory Visit
Admission: RE | Admit: 2018-03-16 | Discharge: 2018-03-16 | Disposition: A | Discharge status: Home / Self Care | Payer: BLUE CROSS/BLUE SHIELD | Source: Ambulatory Visit | Attending: Nurse Practitioner | Admitting: Nurse Practitioner

## 2018-03-16 ENCOUNTER — Encounter: Payer: Self-pay | Admitting: Nurse Practitioner

## 2018-03-16 ENCOUNTER — Other Ambulatory Visit: Payer: Self-pay | Admitting: Nurse Practitioner

## 2018-03-16 VITALS — BP 137/71 | HR 81 | Resp 16 | Ht 66.0 in | Wt 242.4 lb

## 2018-03-16 DIAGNOSIS — J189 Pneumonia, unspecified organism: Secondary | ICD-10-CM | POA: Diagnosis not present

## 2018-03-16 DIAGNOSIS — R05 Cough: Secondary | ICD-10-CM | POA: Diagnosis not present

## 2018-03-16 DIAGNOSIS — F17200 Nicotine dependence, unspecified, uncomplicated: Secondary | ICD-10-CM | POA: Diagnosis not present

## 2018-03-16 DIAGNOSIS — R944 Abnormal results of kidney function studies: Secondary | ICD-10-CM | POA: Diagnosis not present

## 2018-03-16 DIAGNOSIS — I4891 Unspecified atrial fibrillation: Secondary | ICD-10-CM

## 2018-03-16 DIAGNOSIS — Z01818 Encounter for other preprocedural examination: Secondary | ICD-10-CM | POA: Diagnosis not present

## 2018-03-16 NOTE — Progress Notes (Signed)
[facility] [place], [place] [address]  Internal MEDICINE  Office Visit Note  Patient Name: Leslie [name]  [id number]  [id number]  Date of Service: 03/16/2018  Chief Complaint  Patient presents with  . Medical Clearance    surgical clearance for knee  . Palpitations    pt have some concerns about heart rate being elevated    The patient is here for surgical clearance. Has tear of her meniscus of her right knee. She fell at work and hurt her knee and tore the meniscus. She was initially denied clearance because she ad been treated for pneumonia and then sinus infection. Today, she is feeling much better. Still has some nasal congestion, but breathing is good without wheezing or cough. She does have some palpitations, intermittently. She has been diagnosed with atrial fibrillation in the last 6 months.  She does see cardiology regularly. She will need to have surgical clearance from cardiologist prior to surgery as well.       Current Medication: Outpatient Encounter Medications as of 03/16/2018  Medication Sig Note  . albuterol (PROVENTIL HFA;VENTOLIN HFA) 108 (90 Base) MCG/ACT inhaler Inhale 2 puffs into the lungs every 4 (four) hours as needed for wheezing or shortness of breath.   . ALPRAZolam (XANAX) 0.5 MG tablet Take 1 tablet (0.5 mg total) by mouth at bedtime as needed for anxiety.   Marland Kitchen atorvastatin (LIPITOR) 40 MG tablet Take 1 tablet (40 mg total) by mouth daily at 6 PM.   . chlorpheniramine-HYDROcodone (TUSSIONEX PENNKINETIC ER) 10-8 MG/5ML SUER Take 5 mLs by mouth every 12 (twelve) hours as needed for cough.   . diltiazem (CARDIZEM CD) 240 MG 24 hr capsule Take 1 capsule (240 mg total) by mouth daily.   Marland Kitchen estradiol (ESTRACE) 0.5 MG tablet Take 1 tablet (0.5 mg total) by mouth daily.   . fluconazole (DIFLUCAN) 150 MG tablet Take 1 tablet (150 mg total) by mouth every three (3) days as needed.   . furosemide (LASIX) 20 MG tablet Take 1 tablet (20 mg  total) by mouth daily.   . metoprolol succinate (TOPROL-XL) 25 MG 24 hr tablet Take 3 tablets (75 mg total) by mouth daily. Take with or immediately following a meal.   . montelukast (SINGULAIR) 10 MG tablet Take 1 tablet (10 mg total) by mouth at bedtime.   . naproxen sodium (ALEVE) 220 MG tablet Take 440 mg by mouth daily as needed (pain).   Marland Kitchen omeprazole (PRILOSEC) 40 MG capsule Take 1 capsule (40 mg total) by mouth daily.   . potassium chloride (K-DUR) 10 MEQ tablet Take 1 tablet (10 mEq total) by mouth daily.   . predniSONE (DELTASONE) 10 MG tablet Take prednisone taper for 6 days: Today take 6 tablets (60 mg total), tomorrow take 5 tablets (50 mg total), Sunday take 4 tablets (40 mg total), Monday take 3 tablets (30 mg total), Tuesday take 2 tablets (20 mg total), then Wednesday take 1 tablet (10 mg total).   Marland Kitchen sulfamethoxazole-trimethoprim (BACTRIM DS,SEPTRA DS) 800-160 MG tablet Take 1 tablet by mouth 2 (two) times daily.   Marland Kitchen warfarin (COUMADIN) 2.5 MG tablet Take 1 tablet (2.5 mg total) by mouth daily for 30 days. As directed by the coumadin clinic. (Patient taking differently: Take 2.5 mg by mouth daily. ) 02/28/2018: On hold until 2/14 due to elevated INR    No facility-administered encounter medications on file as of 03/16/2018.     Surgical History: Past Surgical History:  Procedure  Laterality Date  . ABDOMINAL HYSTERECTOMY     total  . ABDOMINAL HYSTERECTOMY    . BREAST BIOPSY Bilateral 2012  . BREAST BIOPSY Right 08-07-12   fibroadenomatous changes and columnar cells  . BREAST BIOPSY  02/03/2015   stereo byrnett  . CHOLECYSTECTOMY N/A 02/27/2016   Procedure: LAPAROSCOPIC CHOLECYSTECTOMY;  Surgeon: Christene Lye, MD;  Location: [facility] ORS;  Service: General;  Laterality: N/A;  . JOINT REPLACEMENT Left   . KNEE CLOSED REDUCTION Left 04/15/2015   Procedure: CLOSED MANIPULATION KNEE;  Surgeon: Thornton Park, MD;  Location: [facility] ORS;  Service: Orthopedics;  Laterality: Left;   . KNEE SURGERY    . TEE WITHOUT CARDIOVERSION N/A 09/13/2017   Procedure: TRANSESOPHAGEAL ECHOCARDIOGRAM (TEE);  Surgeon: Nelva Bush, MD;  Location: [facility] ORS;  Service: Cardiovascular;  Laterality: N/A;  . TOTAL KNEE ARTHROPLASTY Left 12/25/2014   Procedure: TOTAL KNEE ARTHROPLASTY;  Surgeon: Thornton Park, MD;  Location: [facility] ORS;  Service: Orthopedics;  Laterality: Left;  . TUBAL LIGATION      Medical History: Past Medical History:  Diagnosis Date  . (HFpEF) heart failure with preserved ejection fraction ([place])    a. 08/2017 Echo: EF 55-60%.  Grade 2 diastolic dysfunction.  Moderate mitral stenosis.  . Allergy   . Anemia   . Anxiety   . BRCA negative 03/22/2013  . Bronchitis 02/19/2016   ON LEVAQUIN PO  . Chest tightness   . CHF (congestive heart failure) ([place])   . Cigarette smoker 09/11/2017   8-10 day  . GERD (gastroesophageal reflux disease)   . History of kidney stones   . Hyperlipidemia   . Hypertension   . Moderate mitral stenosis    a.  08/2017 TEE: EF 60 to 65%.  Moderate mitral stenosis.  Mean gradient 14 mmHg.  Valve area 2.59 cm by planimetry, 2.72 cm by pressure half-time.  . Palpitations   . Persistent atrial fibrillation    a. 08/2017 s/p TEE/DCCV; b. CHA2DS2VASc = 2-->warfarin.    Family History: Family History  Problem Relation Age of Onset  . Cancer Mother 55       breast  . Cancer Maternal Aunt        breast  . Cancer Maternal Grandmother        breast    Social History   Socioeconomic History  . Marital status: Married    Spouse name: Not on file  . Number of children: Not on file  . Years of education: Not on file  . Highest education level: Not on file  Occupational History  . Not on file  Social Needs  . Financial resource strain: Not on file  . Food insecurity:    Worry: Not on file    Inability: Not on file  . Transportation needs:    Medical: Not on file    Non-medical: Not on file  Tobacco Use  . Smoking status: Current  Some Day Smoker    Packs/day: 0.50    Years: 20.00    Pack years: 10.00    Types: Cigarettes  . Smokeless tobacco: Never Used  . Tobacco comment: down to 2-3 daily  Substance and Sexual Activity  . Alcohol use: Yes    Alcohol/week: 2.0 standard drinks    Types: 2 Glasses of wine per week    Comment: wine occ  . Drug use: No  . Sexual activity: Not on file  Lifestyle  . Physical activity:    Days per week: Not on file  Minutes per session: Not on file  . Stress: Not on file  Relationships  . Social connections:    Talks on phone: Not on file    Gets together: Not on file    Attends religious service: Not on file    Active member of club or organization: Not on file    Attends meetings of clubs or organizations: Not on file    Relationship status: Not on file  . Intimate partner violence:    Fear of current or ex partner: Not on file    Emotionally abused: Not on file    Physically abused: Not on file    Forced sexual activity: Not on file  Other Topics Concern  . Not on file  Social History Narrative  . Not on file      Review of Systems  Constitutional: Negative for chills, fatigue, fever and unexpected weight change.  HENT: Positive for congestion and postnasal drip. Negative for rhinorrhea, sneezing and sore throat.   Respiratory: Negative for cough, chest tightness, shortness of breath and wheezing.   Cardiovascular: Positive for palpitations. Negative for chest pain.  Gastrointestinal: Negative for abdominal pain, constipation, diarrhea, nausea and vomiting.  Endocrine: Negative for cold intolerance, heat intolerance, polydipsia and polyuria.  Musculoskeletal: Negative for arthralgias, back pain, joint swelling and neck pain.  Skin: Negative for rash.  Allergic/Immunologic: Positive for environmental allergies.  Neurological: Negative for dizziness, tremors, numbness and headaches.  Hematological: Negative for adenopathy. Does not bruise/bleed easily.   Psychiatric/Behavioral: Negative for behavioral problems (Depression), dysphoric mood, sleep disturbance and suicidal ideas. The patient is not nervous/anxious.    Today's Vitals   03/16/18 0912  BP: 137/71  Pulse: 81  Resp: 16  SpO2: 99%  Weight: 242 lb 6.4 oz (110 kg)  Height: 5' 6" (1.676 m)   Body mass index is 39.12 kg/m.  Physical Exam Vitals signs and nursing note reviewed.  Constitutional:      General: She is not in acute distress.    Appearance: Normal appearance. She is well-developed. She is obese. She is not diaphoretic.  HENT:     Head: Normocephalic and atraumatic.     Mouth/Throat:     Pharynx: No oropharyngeal exudate.  Eyes:     Conjunctiva/sclera: Conjunctivae normal.     Pupils: Pupils are equal, round, and reactive to light.  Neck:     Musculoskeletal: Normal range of motion and neck supple.     Thyroid: No thyromegaly.     Vascular: No JVD.     Trachea: No tracheal deviation.  Cardiovascular:     Rate and Rhythm: Normal rate. Rhythm irregular.     Pulses: Normal pulses.     Heart sounds: Normal heart sounds. No murmur. No friction rub. No gallop.   Pulmonary:     Effort: Pulmonary effort is normal. No respiratory distress.     Breath sounds: Normal breath sounds. No wheezing, rhonchi or rales.  Chest:     Chest wall: No tenderness.  Abdominal:     General: Bowel sounds are normal.     Palpations: Abdomen is soft.     Tenderness: There is no abdominal tenderness.  Musculoskeletal:     Comments: Large hematoma present on left hip.   Lymphadenopathy:     Cervical: No cervical adenopathy.  Skin:    General: Skin is warm and dry.  Neurological:     Mental Status: She is alert and oriented to person, place, and time.  Cranial Nerves: No cranial nerve deficit.  Psychiatric:        Behavior: Behavior normal.        Thought Content: Thought content normal.        Judgment: Judgment normal.   Assessment/Plan:  1. Community acquired  pneumonia, unspecified laterality Will get chest x-ray to ensure resolution. Breath sounds are clear and equal today. Chest x-ray showing chronic interstitial lung disease with clearance of pneumonia.  - DG Chest 2 View; Future -  2. Encounter for other preprocedural examination Will chest chest x-ray and CBC and BMP for further evaluation. Patient will also need clearance from cardiology prior to surgery. Lab results attached. The patient is clear for surgery with mild to moderate risk due to relatively new diagnosis of atrial fibrillation and intersitial lung disease. Conditions are stable with current treatment.   3. Atrial fibrillation, unspecified type ([place]) Stable. Continue warfarin therapy and regular visits with cardiology  4. Tobacco dependency Improving. Patient currently smoking five cigarettes or less. concouraged her to keep up the good work. Goal is to quit completely this year.    General Counseling: Kahlia verbalizes understanding of the findings of todays visit and agrees with plan of treatment. I have discussed any further diagnostic evaluation that may be needed or ordered today. We also reviewed her medications today. she has been encouraged to call the office with any questions or concerns that should arise related to todays visit.  Hypertension Counseling:   The following hypertensive lifestyle modification were recommended and discussed:  1. Limiting alcohol intake to less than 1 oz/day of ethanol:(24 oz of beer or 8 oz of wine or 2 oz of 100-proof whiskey). 2. Take baby ASA 81 mg daily. 3. Importance of regular aerobic exercise and losing weight. 4. Reduce dietary saturated fat and cholesterol intake for overall cardiovascular health. 5. Maintaining adequate dietary potassium, calcium, and magnesium intake. 6. Regular monitoring of the blood pressure. 7. Reduce sodium intake to less than 100 mmol/day (less than 2.3 gm of sodium or less than 6 gm of sodium choride)    This patient was seen by [place] with Dr Lavera Guise as a part of collaborative care agreement  Orders Placed This Encounter  Procedures  . DG Chest 2 View     Time spent: 40 Minutes      Dr Lavera Guise Internal medicine

## 2018-03-17 LAB — CBC
Hematocrit: 34 % (ref 34.0–46.6)
Hemoglobin: 11.4 g/dL (ref 11.1–15.9)
MCH: 28.2 pg (ref 26.6–33.0)
MCHC: 33.5 g/dL (ref 31.5–35.7)
MCV: 84 fL (ref 79–97)
Platelets: 496 10*3/uL — ABNORMAL HIGH (ref 150–450)
RBC: 4.04 x10E6/uL (ref 3.77–5.28)
RDW: 14.8 % (ref 11.7–15.4)
WBC: 6.2 10*3/uL (ref 3.4–10.8)

## 2018-03-17 LAB — BASIC METABOLIC PANEL
BUN/Creatinine Ratio: 15 (ref 9–23)
BUN: 17 mg/dL (ref 6–24)
CO2: 25 mmol/L (ref 20–29)
Calcium: 9.3 mg/dL (ref 8.7–10.2)
Chloride: 96 mmol/L (ref 96–106)
Creatinine, Ser: 1.1 mg/dL — ABNORMAL HIGH (ref 0.57–1.00)
GFR calc Af Amer: 67 mL/min/{1.73_m2} (ref >59)
GFR calc non Af Amer: 58 mL/min/{1.73_m2} — ABNORMAL LOW (ref >59)
Glucose: 89 mg/dL (ref 65–99)
Potassium: 3.6 mmol/L (ref 3.5–5.2)
Sodium: 138 mmol/L (ref 134–144)

## 2018-03-20 NOTE — Telephone Encounter (Signed)
Incoming fax from Dr [employer] office checking status of clearance and requesting coumadin be stopped 5 days prior to surgery Would like clearance when completed to be faxed to [facility] at Dr Clydell Hakim, [phone]

## 2018-03-20 NOTE — Telephone Encounter (Signed)
   Primary Cardiologist:Timothy Rockey Situ, MD  Chart reviewed as part of pre-operative protocol coverage. Pre-op clearance already addressed by colleagues in earlier phone notes. To summarize recommendations:  03/03/18:  Patient was seen in the office today and denied any symptoms concerning for ischemia.  Recent cardiac CT showed nonobstructive coronary artery disease.  She was felt to be mildly volume overloaded with plans for outpatient diuresis over the upcoming weekend.  She was also treated for possible bronchitis/COPD exacerbation with steroids.  Of note, the patient has a history of A. fib on Coumadin undergoing prior cardioversion in 2018.  Most recent INR through the Coumadin clinic noted to be 5.8 on 02/27/2018.  With regards to the patient's risk stratification for noncardiac surgery, per revised cardiac index, she would be low risk with a 0.9% risk of adverse event in the perioperative timeframe.  She does not need any further ischemic testing at this time, however she will need further evaluation with regards to her supratherapeutic INR which should be undertaken by the Coumadin clinic as well as improvement in her respiratory status both from a COPD exacerbation/bronchitis perspective as well as mild volume overload.  In this setting, it may be best to delay her procedure until her breathing status improves and her INR is acceptable.  I will defer this decision to the surgical team.         Pt is scheduled to have labs done on 04/06/18 and office visit by Dr. Rockey Situ on 04/10/2018.  According to our pharmacy protocol:        Patient with diagnosis of afib on warfarin for anticoagulation.    Procedure: Right Knee Arthroscopy Date of procedure: TBD  CHADS2-VASc score of  3 (CHF, HTN, AGE, DM2, stroke/tia x 2, CAD, AGE, female)  Per office protocol, patient can hold warfarin for 3 days prior to procedure.           Will route this bundled recommendation to requesting provider  via Epic fax function. Please call with questions.  Daune Perch, NP 03/20/2018, 4:45 PM

## 2018-03-21 ENCOUNTER — Encounter: Payer: Self-pay | Admitting: Nurse Practitioner

## 2018-03-22 ENCOUNTER — Telehealth: Payer: Self-pay

## 2018-03-22 ENCOUNTER — Telehealth: Payer: Self-pay | Admitting: Cardiovascular Disease

## 2018-03-22 ENCOUNTER — Institutional Professional Consult (permissible substitution): Payer: BLUE CROSS/BLUE SHIELD | Admitting: Internal Medicine

## 2018-03-22 NOTE — Telephone Encounter (Signed)
Spoke to [place]. Pt previously cleared to hold 3 days, but had supratherapeutic INR and did not follow up with coumadin clinic. She has an appointment for 3/9. If INR is therapeutic-will need to send over to Dr. Marry Guan office so they can schedule surgery

## 2018-03-22 NOTE — Telephone Encounter (Signed)
If Home Health RN is calling please get Coumadin Nurse on the phone STAT  1.  Are you calling in regards to an appointment? No   2.  Are you calling for a refill ? no  3.  Are you having bleeding issues?  No   4.  Do you need clearance to hold Coumadin? Yes   Please call Dr. Clydell Hakim nurse Margaretha Sheffield [phone] when patient is therapeutic for coumadin as they should then be rescheduled for procedure.  Patient has upcoming appt. 3/9    Please route to the Coumadin Clinic Pool

## 2018-03-22 NOTE — Telephone Encounter (Signed)
Faxed medical clearance to [facility] clinic

## 2018-03-23 ENCOUNTER — Other Ambulatory Visit: Payer: Self-pay | Admitting: *Deleted

## 2018-03-23 ENCOUNTER — Other Ambulatory Visit: Payer: Self-pay

## 2018-03-23 DIAGNOSIS — J0100 Acute maxillary sinusitis, unspecified: Secondary | ICD-10-CM

## 2018-03-23 MED ORDER — SULFAMETHOXAZOLE-TRIMETHOPRIM 800-160 MG PO TABS: 1.0000 | ORAL_TABLET | Freq: Two times a day (BID) | ORAL | 0 refills | Status: DC

## 2018-03-23 MED ORDER — FUROSEMIDE 20 MG PO TABS: 20.0000 mg | ORAL_TABLET | Freq: Every day | ORAL | 1 refills | Status: DC

## 2018-03-23 MED ORDER — DILTIAZEM HCL ER COATED BEADS 240 MG PO CP24: 240.0000 mg | ORAL_CAPSULE | Freq: Every day | ORAL | 1 refills | Status: DC

## 2018-03-27 ENCOUNTER — Ambulatory Visit (INDEPENDENT_AMBULATORY_CARE_PROVIDER_SITE_OTHER): Payer: BLUE CROSS/BLUE SHIELD

## 2018-03-27 DIAGNOSIS — I4891 Unspecified atrial fibrillation: Secondary | ICD-10-CM | POA: Diagnosis not present

## 2018-03-27 DIAGNOSIS — Z5181 Encounter for therapeutic drug level monitoring: Secondary | ICD-10-CM | POA: Diagnosis not present

## 2018-03-27 DIAGNOSIS — I050 Rheumatic mitral stenosis: Secondary | ICD-10-CM

## 2018-03-27 LAB — POCT INR: INR: 5.7 — AB (ref 2.0–3.0)

## 2018-03-27 NOTE — Telephone Encounter (Addendum)
Pt's INR is supratherapeutic again today @ 5.7. Pt apologized for missing her last INR check; she has a lot going on at home and it slipped her mind.  I advised pt to hold her coumadin x 3 days and have reduced her dosage - will recheck INR in 1 week.  Routing to Dr. Marry Guan as Juluis Rainier.

## 2018-03-27 NOTE — Patient Instructions (Signed)
Please skip coumadin tonight, tomorrow & Wednesday then START NEW DOSAGE of 1 tablet (2.5 mg) every day EXCEPT 1/2 TABLET ON MONDAYS, [place]. Please take your coumadin in the evenings.  Try to get your greens on a schedule - pick a day or days each week to have your greens and have them every week on that day.  Recheck in 1 week.

## 2018-04-03 ENCOUNTER — Other Ambulatory Visit: Payer: Self-pay

## 2018-04-03 ENCOUNTER — Ambulatory Visit (INDEPENDENT_AMBULATORY_CARE_PROVIDER_SITE_OTHER): Payer: BLUE CROSS/BLUE SHIELD

## 2018-04-03 DIAGNOSIS — Z5181 Encounter for therapeutic drug level monitoring: Secondary | ICD-10-CM

## 2018-04-03 DIAGNOSIS — I4891 Unspecified atrial fibrillation: Secondary | ICD-10-CM

## 2018-04-03 DIAGNOSIS — I050 Rheumatic mitral stenosis: Secondary | ICD-10-CM

## 2018-04-03 LAB — POCT INR: INR: 2.5 (ref 2.0–3.0)

## 2018-04-03 NOTE — Patient Instructions (Signed)
Please continue of 1 tablet (2.5 mg) every day EXCEPT 1/2 TABLET ON MONDAYS, [place]. Please take your coumadin in the evenings.  Try to get your greens on a schedule - pick a day or days each week to have your greens and have them every week on that day.  Recheck in 2 weeks.

## 2018-04-05 ENCOUNTER — Other Ambulatory Visit: Payer: BLUE CROSS/BLUE SHIELD

## 2018-04-06 ENCOUNTER — Telehealth: Payer: Self-pay

## 2018-04-06 ENCOUNTER — Other Ambulatory Visit: Payer: BLUE CROSS/BLUE SHIELD

## 2018-04-06 NOTE — Telephone Encounter (Signed)
Call to patient in regards to COVID-19 restrictions.  Pt reporting that sx have not improved and she would like to be seen.    COVID-19 Pre-Screening:  1. Have you been in contact with someone who was sick?  no 2. Do you have any of the following symptoms (cough, fever, muscle pain, vomiting, diarrhea, weakness abdominal pain, rash, red eye, bruising or bleeding, joint pain, severe headache)?  No, ongoing cough, not new 3. Have you travelled internationally or out of state in the last month?  no  4. Do you need any refills at this time?  n/a  Patient aware of the following: Please be advised that we require, no one but yourself to come to appointment. If necessary, only one visitor may come with you into the building. They will also be asked the same screening questions. You will be contacted at a later time to reschedule. However, this will depend on ongoing evaluation of the Covid-19 situation.  Please call us if any new questions or concerns arise. We are here for advice.  Routing to COVID cancel pool.

## 2018-04-07 ENCOUNTER — Other Ambulatory Visit (INDEPENDENT_AMBULATORY_CARE_PROVIDER_SITE_OTHER): Payer: BLUE CROSS/BLUE SHIELD

## 2018-04-07 ENCOUNTER — Other Ambulatory Visit: Payer: Self-pay

## 2018-04-07 DIAGNOSIS — Z5181 Encounter for therapeutic drug level monitoring: Secondary | ICD-10-CM

## 2018-04-07 MED ORDER — WARFARIN SODIUM 2.5 MG PO TABS: ORAL_TABLET | ORAL | 1 refills | Status: DC

## 2018-04-07 NOTE — Telephone Encounter (Signed)
Please review.  Refill fax received from [facility]. Warfarin 2.5MG   Wanting:  90 Day supply  With 4 refills

## 2018-04-08 LAB — LIPID PANEL
Chol/HDL Ratio: 5 ratio — ABNORMAL HIGH (ref 0.0–4.4)
Cholesterol, Total: 170 mg/dL (ref 100–199)
HDL: 34 mg/dL — ABNORMAL LOW (ref >39)
LDL Calculated: 109 mg/dL — ABNORMAL HIGH (ref 0–99)
Triglycerides: 136 mg/dL (ref 0–149)
VLDL Cholesterol Cal: 27 mg/dL (ref 5–40)

## 2018-04-08 LAB — HEPATIC FUNCTION PANEL
ALT: 19 IU/L (ref 0–32)
AST: 23 IU/L (ref 0–40)
Albumin: 4.2 g/dL (ref 3.8–4.9)
Alkaline Phosphatase: 126 IU/L — ABNORMAL HIGH (ref 39–117)
Bilirubin Total: 0.3 mg/dL (ref 0.0–1.2)
Bilirubin, Direct: 0.08 mg/dL (ref 0.00–0.40)
Total Protein: 7.3 g/dL (ref 6.0–8.5)

## 2018-04-10 ENCOUNTER — Ambulatory Visit (INDEPENDENT_AMBULATORY_CARE_PROVIDER_SITE_OTHER): Payer: BLUE CROSS/BLUE SHIELD | Admitting: Cardiovascular Disease

## 2018-04-10 ENCOUNTER — Other Ambulatory Visit: Payer: Self-pay

## 2018-04-10 ENCOUNTER — Encounter: Payer: Self-pay | Admitting: Cardiovascular Disease

## 2018-04-10 ENCOUNTER — Encounter: Payer: Self-pay | Admitting: *Deleted

## 2018-04-10 VITALS — BP 120/58 | HR 71 | Ht 64.0 in | Wt 244.5 lb

## 2018-04-10 DIAGNOSIS — I2510 Atherosclerotic heart disease of native coronary artery without angina pectoris: Secondary | ICD-10-CM | POA: Diagnosis not present

## 2018-04-10 DIAGNOSIS — I050 Rheumatic mitral stenosis: Secondary | ICD-10-CM | POA: Diagnosis not present

## 2018-04-10 DIAGNOSIS — I480 Paroxysmal atrial fibrillation: Secondary | ICD-10-CM

## 2018-04-10 DIAGNOSIS — I471 Supraventricular tachycardia: Secondary | ICD-10-CM

## 2018-04-10 DIAGNOSIS — I5033 Acute on chronic diastolic (congestive) heart failure: Secondary | ICD-10-CM | POA: Diagnosis not present

## 2018-04-10 DIAGNOSIS — E782 Mixed hyperlipidemia: Secondary | ICD-10-CM

## 2018-04-10 DIAGNOSIS — G4730 Sleep apnea, unspecified: Secondary | ICD-10-CM

## 2018-04-10 MED ORDER — FUROSEMIDE 20 MG PO TABS: ORAL_TABLET | ORAL | 2 refills | Status: DC

## 2018-04-10 MED ORDER — METOPROLOL SUCCINATE ER 25 MG PO TB24: 25.0000 mg | ORAL_TABLET | Freq: Every day | ORAL | 3 refills | Status: DC

## 2018-04-10 MED ORDER — POTASSIUM CHLORIDE ER 10 MEQ PO TBCR: EXTENDED_RELEASE_TABLET | ORAL | 2 refills | Status: DC

## 2018-04-10 NOTE — Patient Instructions (Addendum)
Work note to work at home  Medication Instructions:  Please increase the potassium up to 20 meq daily- please take 2 of the 10 meq tablets (20 meq) once daily  Please take extra lasix after lunch as needed for leg swelling  If you need a refill on your cardiac medications before your next appointment, please call your pharmacy.    Lab work: No new labs needed   If you have labs (blood work) drawn today and your tests are completely normal, you will receive your results only by: Marland Kitchen MyChart Message (if you have MyChart) OR . A paper copy in the mail If you have any lab test that is abnormal or we need to change your treatment, we will call you to review the results.   Testing/Procedures: No new testing needed   Follow-Up: At [facility], you and your health needs are our priority.  As part of our continuing mission to provide you with exceptional heart care, we have created designated Provider Care Teams.  These Care Teams include your primary Cardiologist (physician) and Advanced Practice Providers (APPs -  Physician Assistants and Nurse Practitioners) who all work together to provide you with the care you need, when you need it.  . You will need a follow up appointment in 6 months .   Please call our office 2 months in advance to schedule this appointment.    . Providers on your designated Care Team:   . Murray Hodgkins, NP . Christell Faith, PA-C . Marrianne Mood, PA-C  Any Other Special Instructions Will Be Listed Below (If Applicable).  For educational health videos Log in to : [email or web address] Or : [email or web address], password : [email or web address]

## 2018-04-10 NOTE — Progress Notes (Signed)
Cardiology Office Note  Date:  04/10/2018   ID:  Leslie [name], DOB 1965-01-23, MRN [id number]  PCP:  Ronnell Freshwater, NP   Chief Complaint  Patient presents with  . other    2-4 month follow up and discuss medication changes. Meds reviewed by the pt. verbally. Pt. c/o bilateral LE edema and some shortness of breath.     HPI:  Leslie [name] is a 53 y.o. female with a hx of  morbid obesity,  Snores, possible OSA anxiety,  hyperlipidemia,  Hospital admission 09/11/2017 for  shortness of breath chest tightness in atrial fibrillation with RVR Had TEE (moderate MS)  Cardioversion Mild COPD, long hx of smoking Who presents for follow up of her atrial fibrillation  Quit smoking, she is very proud of her accomplishment  We reviewed recent atrial fibrillation history with her in detail It would appear her last episode of atrial fibrillation documented was December 2019 01/05/2018 awoke suddenly with tachypalpitations  rates in the 130s   EMS was called, noted to be in sinus rhythm with heart rates in the 80s to 90s bpm.    continued paroxysmal symptoms  seen on 12/20.   EKG at that time showed sinus rhythm.  Her diltiazem was increased to 240 mg daily.   cardiac CT was ordered for chest tightness  Cardiac CTA 1. Coronary calcium score of 10. This was 39 percentile for age and sex matched control.. Normal coronary origin with right dominance.Mild non-obstructive CAD.   ED on 01/17/2018 with continued intermittent palpitations and chest pain potassium 2.8,  outpatient cardiac monitoring showed no evidence of A. fib  suffer a mechanical fall  Scheduled to have knee surgery with Dr. Marry Guan  On review of her medication she is taking Cardizem CD 240 mg daily with metoprolol succinate 25 daily.  Medication list indicates she was taking 75  EKG personally reviewed by myself on todays visit Showing normal sinus rhythm with rate 71 bpm no significant ST or T wave changes  Previous  records reviewed with her in detail TEE on 09/13/2017 Normal EF Moderate MS Mean gradient 14 mm Hg   PMH:   has a past medical history of (HFpEF) heart failure with preserved ejection fraction ([place]), Allergy, Anemia, Anxiety, BRCA negative (03/22/2013), Bronchitis (02/19/2016), Chest tightness, CHF (congestive heart failure) ([place]), Cigarette smoker (09/11/2017), GERD (gastroesophageal reflux disease), History of kidney stones, Hyperlipidemia, Hypertension, Moderate mitral stenosis, Palpitations, and Persistent atrial fibrillation.  PSH:    Past Surgical History:  Procedure Laterality Date  . ABDOMINAL HYSTERECTOMY     total  . ABDOMINAL HYSTERECTOMY    . BREAST BIOPSY Bilateral 2012  . BREAST BIOPSY Right 08-07-12   fibroadenomatous changes and columnar cells  . BREAST BIOPSY  02/03/2015   stereo byrnett  . CHOLECYSTECTOMY N/A 02/27/2016   Procedure: LAPAROSCOPIC CHOLECYSTECTOMY;  Surgeon: Christene Lye, MD;  Location: [facility] ORS;  Service: General;  Laterality: N/A;  . JOINT REPLACEMENT Left   . KNEE CLOSED REDUCTION Left 04/15/2015   Procedure: CLOSED MANIPULATION KNEE;  Surgeon: Thornton Park, MD;  Location: [facility] ORS;  Service: Orthopedics;  Laterality: Left;  . KNEE SURGERY    . TEE WITHOUT CARDIOVERSION N/A 09/13/2017   Procedure: TRANSESOPHAGEAL ECHOCARDIOGRAM (TEE);  Surgeon: Nelva Bush, MD;  Location: [facility] ORS;  Service: Cardiovascular;  Laterality: N/A;  . TOTAL KNEE ARTHROPLASTY Left 12/25/2014   Procedure: TOTAL KNEE ARTHROPLASTY;  Surgeon: Thornton Park, MD;  Location: [facility] ORS;  Service: Orthopedics;  Laterality: Left;  .  TUBAL LIGATION      Current Outpatient Medications  Medication Sig Dispense Refill  . albuterol (PROVENTIL HFA;VENTOLIN HFA) 108 (90 Base) MCG/ACT inhaler Inhale 2 puffs into the lungs every 4 (four) hours as needed for wheezing or shortness of breath. 1 Inhaler 0  . ALPRAZolam (XANAX) 0.5 MG tablet Take 1 tablet (0.5 mg total) by mouth at  bedtime as needed for anxiety. 90 tablet 1  . atorvastatin (LIPITOR) 40 MG tablet Take 1 tablet (40 mg total) by mouth daily at 6 PM. 90 tablet 3  . diltiazem (CARDIZEM CD) 240 MG 24 hr capsule Take 1 capsule (240 mg total) by mouth daily. 90 capsule 1  . estradiol (ESTRACE) 0.5 MG tablet Take 1 tablet (0.5 mg total) by mouth daily. 90 tablet 1  . furosemide (LASIX) 20 MG tablet Take 1 tablet (20 mg) by mouth once daily. You may take 1 extra tablet (20 mg) after lunch as needed for leg swelling 135 tablet 2  . metoprolol succinate (TOPROL-XL) 25 MG 24 hr tablet Take 1 tablets (25 mg total) by mouth daily.  -Was written for 75 but she is only taking 25 daily, prescription has been changed 90 tablet 11  . montelukast (SINGULAIR) 10 MG tablet Take 1 tablet (10 mg total) by mouth at bedtime. 90 tablet 2  . naproxen sodium (ALEVE) 220 MG tablet Take 440 mg by mouth daily as needed (pain).    Marland Kitchen omeprazole (PRILOSEC) 40 MG capsule Take 1 capsule (40 mg total) by mouth daily. 90 capsule 2  . potassium chloride (K-DUR) 10 MEQ tablet Take 2 tablets (20 meq) by mouth once daily 180 tablet 2  . warfarin (COUMADIN) 2.5 MG tablet TAKE 1/2 TO 1 TABLET DAILY AS DIRECTED BY COUMADIN CLINIC. 90 tablet 1  . sulfamethoxazole-trimethoprim (BACTRIM DS,SEPTRA DS) 800-160 MG tablet Take 1 tablet by mouth 2 (two) times daily. (Patient not taking: Reported on 04/10/2018) 40 tablet 0   No current facility-administered medications for this visit.      Allergies:   Morphine; Oxycodone hcl; and Dilaudid [hydromorphone hcl]   Social History:  The patient  reports that she quit smoking about 7 weeks ago. Her smoking use included cigarettes. She has a 10.00 pack-year smoking history. She has never used smokeless tobacco. She reports current alcohol use of about 2.0 standard drinks of alcohol per week. She reports that she does not use drugs.   Family History:   family history includes Cancer in her maternal aunt and maternal  grandmother; Cancer (age of onset: 33) in her mother.    Review of Systems: Review of Systems  Constitutional: Negative.   Respiratory: Negative.   Cardiovascular: Negative.   Gastrointestinal: Negative.   Musculoskeletal: Positive for joint pain.  Neurological: Negative.   Psychiatric/Behavioral: Negative.   All other systems reviewed and are negative.   PHYSICAL EXAM: VS:  BP (!) 120/58 (BP Location: Left Arm, Patient Position: Sitting, Cuff Size: Normal)   Pulse 71   Ht '5\' 4"'  (1.626 m)   Wt 244 lb 8 oz (110.9 kg)   BMI 41.97 kg/m  , BMI Body mass index is 41.97 kg/m. GEN: Well nourished, well developed, in no acute distress morbid obesity HEENT: normal  Neck: no JVD, carotid bruits, or masses Cardiac: RRR; 2/6 SEM LSB,   rubs, or gallops,no edema  Respiratory:  clear to auscultation bilaterally, normal work of breathing GI: soft, nontender, nondistended, + BS MS: no deformity or atrophy  Skin: warm and dry, no  rash Neuro:  Strength and sensation are intact Psych: euthymic mood, full affect  Recent Labs: 01/06/2018: Magnesium 1.9 01/13/2018: TSH 0.411 02/07/2018: B Natriuretic Peptide 269.0 03/16/2018: BUN 17; Creatinine, Ser 1.10; Hemoglobin 11.4; Platelets 496; Potassium 3.6; Sodium 138 04/07/2018: ALT 19    Lipid Panel Lab Results  Component Value Date   CHOL 170 04/07/2018   HDL 34 (L) 04/07/2018   LDLCALC 109 (H) 04/07/2018   TRIG 136 04/07/2018      Wt Readings from Last 3 Encounters:  04/10/18 244 lb 8 oz (110.9 kg)  03/16/18 242 lb 6.4 oz (110 kg)  03/03/18 246 lb 12.8 oz (111.9 kg)      ASSESSMENT AND PLAN:  Preop cardiovascular Scheduled for knee surgery with Dr. Marry Guan Acceptable risk, will need to come off warfarin 5 days prior to surgery Will need DVT prophylaxis following surgery with resumption of her warfarin  Persistent atrial fibrillation ([place]) - Plan: EKG 12-Lead Maintaining normal sinus rhythm She is only taking diltiazem 240  daily with metoprolol succinate 25 daily On this regimen she denies any breakthrough tachycardia or palpitations concerning for atrial fibrillation in the past 3 months She believes last episode was December 2019 Supratherapeutic INR Working with Coumadin clinic  Mitral valve stenosis, unspecified etiology - Plan: EKG 12-Lead No medication changes Recommended routine echocardiogram August or September 2020 to evaluate mitral valve  Essential hypertension Blood pressure is well controlled on today's visit. No changes made to the medications. Medication list has been updated for low-dose metoprolol 25, she is not taking 75 daily  OSA (obstructive sleep apnea) managed by primary care Not on CPAP  Lower extremity edema History of chronic diastolic CHF exacerbated by morbid obesity and atrial fibrillation.  Likely also with diastolic dysfunction Recommend Lasix in the afternoon sparingly only for worsening leg edema otherwise Lasix daily 20 mg with potassium increase up to 20 mill equivalents daily  Disposition:   F/U  6 months   Total encounter time more than 25 minutes  Greater than 50% was spent in counseling and coordination of care with the patient    Orders Placed This Encounter  Procedures  . EKG 12-Lead     Signed, Esmond Plants, M.D., Ph.D. 04/10/2018  [place], [place]

## 2018-04-11 ENCOUNTER — Telehealth: Payer: Self-pay

## 2018-04-11 NOTE — Telephone Encounter (Signed)
Call to patient to discuss results of recent labwork. She reports that she did not make change to Lipitor dose. Charting from 2/11 reflects that dose was changed to 1 tablet (40 mg total) once daily.   Pt reports that she will take 2 tablets (20 mg x 2) until she runs out and will f/u as planned.   Advised pt to call for any further questions or concerns.

## 2018-04-17 ENCOUNTER — Other Ambulatory Visit: Payer: Self-pay

## 2018-04-17 ENCOUNTER — Ambulatory Visit (INDEPENDENT_AMBULATORY_CARE_PROVIDER_SITE_OTHER): Payer: BLUE CROSS/BLUE SHIELD

## 2018-04-17 DIAGNOSIS — I4891 Unspecified atrial fibrillation: Secondary | ICD-10-CM

## 2018-04-17 DIAGNOSIS — I050 Rheumatic mitral stenosis: Secondary | ICD-10-CM | POA: Diagnosis not present

## 2018-04-17 DIAGNOSIS — Z5181 Encounter for therapeutic drug level monitoring: Secondary | ICD-10-CM | POA: Diagnosis not present

## 2018-04-17 LAB — POCT INR: INR: 2.7 (ref 2.0–3.0)

## 2018-04-17 NOTE — Patient Instructions (Signed)
Please continue of 1 tablet (2.5 mg) every day EXCEPT 1/2 TABLET ON MONDAYS, [place]. Recheck in 4 weeks.

## 2018-04-18 ENCOUNTER — Other Ambulatory Visit: Payer: Self-pay

## 2018-04-18 DIAGNOSIS — K219 Gastro-esophageal reflux disease without esophagitis: Secondary | ICD-10-CM

## 2018-04-18 MED ORDER — MONTELUKAST SODIUM 10 MG PO TABS: 10.0000 mg | ORAL_TABLET | Freq: Every day | ORAL | 2 refills | Status: DC

## 2018-04-18 MED ORDER — OMEPRAZOLE 40 MG PO CPDR: 40.0000 mg | DELAYED_RELEASE_CAPSULE | Freq: Every day | ORAL | 2 refills | Status: DC

## 2018-05-11 ENCOUNTER — Other Ambulatory Visit: Payer: Self-pay

## 2018-05-11 MED ORDER — FUROSEMIDE 20 MG PO TABS: ORAL_TABLET | ORAL | 2 refills | Status: DC

## 2018-05-11 NOTE — Telephone Encounter (Signed)
Refill sent for Furosemide 20 mg  

## 2018-05-12 ENCOUNTER — Telehealth: Payer: Self-pay

## 2018-05-12 NOTE — Telephone Encounter (Signed)
Attempted to contact pt to prescreen for INR check on Monday, April 27. Left message for pt that I will be in front of the [place] entrance of [facility], to please call if she will be unable to keep appt.

## 2018-05-15 ENCOUNTER — Ambulatory Visit (INDEPENDENT_AMBULATORY_CARE_PROVIDER_SITE_OTHER): Payer: BLUE CROSS/BLUE SHIELD

## 2018-05-15 ENCOUNTER — Other Ambulatory Visit: Payer: Self-pay

## 2018-05-15 DIAGNOSIS — Z5181 Encounter for therapeutic drug level monitoring: Secondary | ICD-10-CM | POA: Diagnosis not present

## 2018-05-15 DIAGNOSIS — I4891 Unspecified atrial fibrillation: Secondary | ICD-10-CM | POA: Diagnosis not present

## 2018-05-15 DIAGNOSIS — I050 Rheumatic mitral stenosis: Secondary | ICD-10-CM | POA: Diagnosis not present

## 2018-05-15 LAB — POCT INR: INR: 2 (ref 2.0–3.0)

## 2018-05-15 NOTE — Patient Instructions (Signed)
Please continue of 1 tablet (2.5 mg) every day EXCEPT 1/2 TABLET ON MONDAYS, [place]. Recheck in 5 weeks.

## 2018-05-19 ENCOUNTER — Encounter: Payer: Self-pay | Admitting: Nurse Practitioner

## 2018-05-25 DIAGNOSIS — M2391 Unspecified internal derangement of right knee: Secondary | ICD-10-CM | POA: Diagnosis not present

## 2018-06-13 ENCOUNTER — Other Ambulatory Visit: Payer: Self-pay

## 2018-06-15 ENCOUNTER — Telehealth: Payer: Self-pay

## 2018-06-15 MED ORDER — METOPROLOL SUCCINATE ER 25 MG PO TB24: 25.0000 mg | ORAL_TABLET | Freq: Every day | ORAL | 0 refills | Status: DC

## 2018-06-15 NOTE — Telephone Encounter (Signed)
Requested Prescriptions   Signed Prescriptions Disp Refills   metoprolol succinate (TOPROL-XL) 25 MG 24 hr tablet 90 tablet 0    Sig: Take 1 tablet (25 mg total) by mouth daily. Take with or immediately following a meal.    Authorizing Provider: GOLLAN, TIMOTHY J    Ordering User: NEWCOMER MCCLAIN, Trayon Krantz L    

## 2018-06-16 ENCOUNTER — Telehealth: Payer: Self-pay

## 2018-06-16 ENCOUNTER — Other Ambulatory Visit: Payer: Self-pay

## 2018-06-16 MED ORDER — ALBUTEROL SULFATE HFA 108 (90 BASE) MCG/ACT IN AERS: 2.0000 | INHALATION_SPRAY | RESPIRATORY_TRACT | 0 refills | Status: DC | PRN

## 2018-06-16 NOTE — Telephone Encounter (Signed)

## 2018-06-19 ENCOUNTER — Ambulatory Visit (INDEPENDENT_AMBULATORY_CARE_PROVIDER_SITE_OTHER): Payer: BC Managed Care – PPO | Admitting: Nurse Practitioner

## 2018-06-19 ENCOUNTER — Encounter: Payer: Self-pay | Admitting: Nurse Practitioner

## 2018-06-19 ENCOUNTER — Ambulatory Visit (INDEPENDENT_AMBULATORY_CARE_PROVIDER_SITE_OTHER): Payer: BLUE CROSS/BLUE SHIELD

## 2018-06-19 ENCOUNTER — Other Ambulatory Visit: Payer: Self-pay

## 2018-06-19 VITALS — BP 129/52 | HR 67 | Resp 16 | Ht 64.0 in | Wt 258.0 lb

## 2018-06-19 DIAGNOSIS — I050 Rheumatic mitral stenosis: Secondary | ICD-10-CM | POA: Diagnosis not present

## 2018-06-19 DIAGNOSIS — R300 Dysuria: Secondary | ICD-10-CM

## 2018-06-19 DIAGNOSIS — I4891 Unspecified atrial fibrillation: Secondary | ICD-10-CM

## 2018-06-19 DIAGNOSIS — M545 Low back pain, unspecified: Secondary | ICD-10-CM

## 2018-06-19 DIAGNOSIS — N760 Acute vaginitis: Secondary | ICD-10-CM | POA: Diagnosis not present

## 2018-06-19 DIAGNOSIS — F419 Anxiety disorder, unspecified: Secondary | ICD-10-CM

## 2018-06-19 DIAGNOSIS — B9689 Other specified bacterial agents as the cause of diseases classified elsewhere: Secondary | ICD-10-CM

## 2018-06-19 DIAGNOSIS — Z5181 Encounter for therapeutic drug level monitoring: Secondary | ICD-10-CM | POA: Diagnosis not present

## 2018-06-19 DIAGNOSIS — N390 Urinary tract infection, site not specified: Secondary | ICD-10-CM

## 2018-06-19 DIAGNOSIS — R600 Localized edema: Secondary | ICD-10-CM

## 2018-06-19 LAB — POCT URINALYSIS DIPSTICK
Bilirubin, UA: NEGATIVE
Blood, UA: NEGATIVE
Glucose, UA: NEGATIVE
Ketones, UA: NEGATIVE
Leukocytes, UA: NEGATIVE
Nitrite, UA: NEGATIVE
Protein, UA: NEGATIVE
Spec Grav, UA: <=1.005 — AB (ref 1.010–1.025)
Urobilinogen, UA: 0.2 E.U./dL
pH, UA: 6.5 (ref 5.0–8.0)

## 2018-06-19 LAB — POCT INR: INR: 2.7 (ref 2.0–3.0)

## 2018-06-19 MED ORDER — ALPRAZOLAM 0.5 MG PO TABS: 0.5000 mg | ORAL_TABLET | Freq: Every evening | ORAL | 2 refills | Status: DC | PRN

## 2018-06-19 MED ORDER — TIZANIDINE HCL 4 MG PO TABS: 4.0000 mg | ORAL_TABLET | Freq: Two times a day (BID) | ORAL | 1 refills | Status: DC | PRN

## 2018-06-19 MED ORDER — NITROFURANTOIN MONOHYD MACRO 100 MG PO CAPS: 100.0000 mg | ORAL_CAPSULE | Freq: Two times a day (BID) | ORAL | 0 refills | Status: DC

## 2018-06-19 MED ORDER — METRONIDAZOLE 0.75 % VA GEL: 1.0000 | Freq: Every day | VAGINAL | 1 refills | Status: DC

## 2018-06-19 MED ORDER — PHENAZOPYRIDINE HCL 200 MG PO TABS: 200.0000 mg | ORAL_TABLET | Freq: Three times a day (TID) | ORAL | 0 refills | Status: DC | PRN

## 2018-06-19 NOTE — Patient Instructions (Signed)
Please continue of 1 tablet (2.5 mg) every day EXCEPT 1/2 TABLET ON MONDAYS, [place]. Recheck in 6 weeks.

## 2018-06-19 NOTE — Progress Notes (Signed)
Pt blood pressure elevated taken twice 1st reading 130/98 2nd reading 129/52 Informed provider.

## 2018-06-19 NOTE — Progress Notes (Signed)
[facility] [place], [place] [address]  Internal MEDICINE  Office Visit Note  Patient Name: Leslie [name]  [id number]  [id number]  Date of Service: 06/27/2018  Chief Complaint  Patient presents with  . Medical Management of Chronic Issues    Follow up medication refill  . Urinary Tract Infection    pt having lower back pain, possible kidney infection/UTI  . Edema    both ankles taken extra lasix possibly retaining fluid    The patient is here for sick visit. She states that she is having back pain which radiates around into the groin and in the lower abdomen. Pain is worse in the left flank pain. She denies nausea, vomiting, ffever, or chills. She states that she has a mild amount of vaginal discharge with does have a mild odor to it as well.  She also mentions that she has had some swelling in the feet and ankles. Currently taking furosemide 77m daily. Will take a second dose as needed for edema and shortness of breath. This medication is prescribed per her cardiology.       Current Medication: Outpatient Encounter Medications as of 06/19/2018  Medication Sig  . albuterol (VENTOLIN HFA) 108 (90 Base) MCG/ACT inhaler Inhale 2 puffs into the lungs every 4 (four) hours as needed for wheezing or shortness of breath.  . ALPRAZolam (XANAX) 0.5 MG tablet Take 1 tablet (0.5 mg total) by mouth at bedtime as needed for anxiety.  .Marland Kitchenatorvastatin (LIPITOR) 40 MG tablet Take 1 tablet (40 mg total) by mouth daily at 6 PM.  . diltiazem (CARDIZEM CD) 240 MG 24 hr capsule Take 1 capsule (240 mg total) by mouth daily.  .Marland Kitchenestradiol (ESTRACE) 0.5 MG tablet Take 1 tablet (0.5 mg total) by mouth daily.  . furosemide (LASIX) 20 MG tablet Take 1 tablet (20 mg) by mouth once daily. You may take 1 extra tablet (20 mg) after lunch as needed for leg swelling  . metoprolol succinate (TOPROL-XL) 25 MG 24 hr tablet Take 1 tablet (25 mg total) by mouth daily. Take with or immediately  following a meal.  . montelukast (SINGULAIR) 10 MG tablet Take 1 tablet (10 mg total) by mouth at bedtime.  . naproxen sodium (ALEVE) 220 MG tablet Take 440 mg by mouth daily as needed (pain).  . potassium chloride (K-DUR) 10 MEQ tablet Take 2 tablets (20 meq) by mouth once daily  . warfarin (COUMADIN) 2.5 MG tablet TAKE 1/2 TO 1 TABLET DAILY AS DIRECTED BY COUMADIN CLINIC.  . [DISCONTINUED] ALPRAZolam (XANAX) 0.5 MG tablet Take 1 tablet (0.5 mg total) by mouth at bedtime as needed for anxiety.  . [DISCONTINUED] omeprazole (PRILOSEC) 40 MG capsule Take 1 capsule (40 mg total) by mouth daily.  . metroNIDAZOLE (METROGEL) 0.75 % vaginal gel Place 1 Applicatorful vaginally at bedtime.  . nitrofurantoin, macrocrystal-monohydrate, (MACROBID) 100 MG capsule Take 1 capsule (100 mg total) by mouth 2 (two) times daily.  . phenazopyridine (PYRIDIUM) 200 MG tablet Take 1 tablet (200 mg total) by mouth 3 (three) times daily as needed for pain.  .Marland KitchentiZANidine (ZANAFLEX) 4 MG tablet Take 1 tablet (4 mg total) by mouth 2 (two) times daily as needed for muscle spasms.  . [DISCONTINUED] sulfamethoxazole-trimethoprim (BACTRIM DS,SEPTRA DS) 800-160 MG tablet Take 1 tablet by mouth 2 (two) times daily. (Patient not taking: Reported on 04/10/2018)   No facility-administered encounter medications on file as of 06/19/2018.     Surgical History: Past Surgical History:  Procedure Laterality Date  . ABDOMINAL HYSTERECTOMY     total  . ABDOMINAL HYSTERECTOMY    . BREAST BIOPSY Bilateral 2012  . BREAST BIOPSY Right 08-07-12   fibroadenomatous changes and columnar cells  . BREAST BIOPSY  02/03/2015   stereo byrnett  . CHOLECYSTECTOMY N/A 02/27/2016   Procedure: LAPAROSCOPIC CHOLECYSTECTOMY;  Surgeon: Christene Lye, MD;  Location: [facility] ORS;  Service: General;  Laterality: N/A;  . JOINT REPLACEMENT Left   . KNEE CLOSED REDUCTION Left 04/15/2015   Procedure: CLOSED MANIPULATION KNEE;  Surgeon: Thornton Park, MD;   Location: [facility] ORS;  Service: Orthopedics;  Laterality: Left;  . KNEE SURGERY    . TEE WITHOUT CARDIOVERSION N/A 09/13/2017   Procedure: TRANSESOPHAGEAL ECHOCARDIOGRAM (TEE);  Surgeon: Nelva Bush, MD;  Location: [facility] ORS;  Service: Cardiovascular;  Laterality: N/A;  . TOTAL KNEE ARTHROPLASTY Left 12/25/2014   Procedure: TOTAL KNEE ARTHROPLASTY;  Surgeon: Thornton Park, MD;  Location: [facility] ORS;  Service: Orthopedics;  Laterality: Left;  . TUBAL LIGATION      Medical History: Past Medical History:  Diagnosis Date  . (HFpEF) heart failure with preserved ejection fraction ([place])    a. 08/2017 Echo: EF 55-60%.  Grade 2 diastolic dysfunction.  Moderate mitral stenosis.  . Allergy   . Anemia   . Anxiety   . BRCA negative 03/22/2013  . Bronchitis 02/19/2016   ON LEVAQUIN PO  . Chest tightness   . CHF (congestive heart failure) ([place])   . Cigarette smoker 09/11/2017   8-10 day  . GERD (gastroesophageal reflux disease)   . History of kidney stones   . Hyperlipidemia   . Hypertension   . Moderate mitral stenosis    a.  08/2017 TEE: EF 60 to 65%.  Moderate mitral stenosis.  Mean gradient 14 mmHg.  Valve area 2.59 cm by planimetry, 2.72 cm by pressure half-time.  . Palpitations   . Persistent atrial fibrillation    a. 08/2017 s/p TEE/DCCV; b. CHA2DS2VASc = 2-->warfarin.    Family History: Family History  Problem Relation Age of Onset  . Cancer Mother 69       breast  . Cancer Maternal Aunt        breast  . Cancer Maternal Grandmother        breast    Social History   Socioeconomic History  . Marital status: Married    Spouse name: Not on file  . Number of children: Not on file  . Years of education: Not on file  . Highest education level: Not on file  Occupational History  . Not on file  Social Needs  . Financial resource strain: Not on file  . Food insecurity:    Worry: Not on file    Inability: Not on file  . Transportation needs:    Medical: Not on file     Non-medical: Not on file  Tobacco Use  . Smoking status: Former Smoker    Packs/day: 0.50    Years: 20.00    Pack years: 10.00    Types: Cigarettes    Last attempt to quit: 02/20/2018    Years since quitting: 0.3  . Smokeless tobacco: Never Used  . Tobacco comment: down to 2-3 daily  Substance and Sexual Activity  . Alcohol use: Yes    Alcohol/week: 2.0 standard drinks    Types: 2 Glasses of wine per week    Comment: wine occ  . Drug use: No  . Sexual activity: Not on file  Lifestyle  .  Physical activity:    Days per week: Not on file    Minutes per session: Not on file  . Stress: Not on file  Relationships  . Social connections:    Talks on phone: Not on file    Gets together: Not on file    Attends religious service: Not on file    Active member of club or organization: Not on file    Attends meetings of clubs or organizations: Not on file    Relationship status: Not on file  . Intimate partner violence:    Fear of current or ex partner: Not on file    Emotionally abused: Not on file    Physically abused: Not on file    Forced sexual activity: Not on file  Other Topics Concern  . Not on file  Social History Narrative  . Not on file      Review of Systems  Constitutional: Positive for fatigue. Negative for chills, fever and unexpected weight change.  HENT: Negative for congestion, postnasal drip, rhinorrhea, sneezing and sore throat.   Respiratory: Negative for cough, chest tightness, shortness of breath and wheezing.   Cardiovascular: Positive for palpitations. Negative for chest pain.       Swelling in feet and ankles   Gastrointestinal: Negative for abdominal pain, constipation, diarrhea, nausea and vomiting.  Endocrine: Negative for cold intolerance, heat intolerance, polydipsia and polyuria.  Genitourinary: Positive for flank pain, frequency and vaginal discharge.  Musculoskeletal: Positive for back pain and myalgias. Negative for arthralgias, joint swelling  and neck pain.  Skin: Negative for rash.  Allergic/Immunologic: Positive for environmental allergies.  Neurological: Negative for dizziness, tremors, numbness and headaches.  Hematological: Negative for adenopathy. Does not bruise/bleed easily.  Psychiatric/Behavioral: Negative for behavioral problems (Depression), dysphoric mood, sleep disturbance and suicidal ideas. The patient is nervous/anxious.     Today's Vitals   06/19/18 1633  BP: (!) 129/52  Pulse: 67  Resp: 16  SpO2: 98%  Weight: 258 lb (117 kg)  Height: '5\' 4"'  (1.626 m)   Body mass index is 44.29 kg/m.  Physical Exam Vitals signs and nursing note reviewed.  Constitutional:      General: She is not in acute distress.    Appearance: Normal appearance. She is well-developed. She is not diaphoretic.  HENT:     Head: Normocephalic and atraumatic.     Mouth/Throat:     Pharynx: No oropharyngeal exudate.  Eyes:     Conjunctiva/sclera: Conjunctivae normal.     Pupils: Pupils are equal, round, and reactive to light.  Neck:     Musculoskeletal: Normal range of motion and neck supple.     Thyroid: No thyromegaly.     Vascular: No JVD.     Trachea: No tracheal deviation.  Cardiovascular:     Rate and Rhythm: Normal rate. Rhythm irregular.     Heart sounds: Normal heart sounds. No murmur. No friction rub. No gallop.      Comments: Very soft, systolic murmur auscultated. Pulmonary:     Effort: Pulmonary effort is normal. No respiratory distress.     Breath sounds: Normal breath sounds. No wheezing or rales.  Chest:     Chest wall: No tenderness.  Abdominal:     General: Bowel sounds are normal.     Palpations: Abdomen is soft.     Tenderness: There is abdominal tenderness.  Genitourinary:    Comments: Urine sample is negative for infection or other abnormalities.  Musculoskeletal: Normal range of motion.  Comments: Moderate lower back pain. This is worse with bending and twisting at the waist.   Lymphadenopathy:      Cervical: No cervical adenopathy.  Skin:    General: Skin is warm and dry.     Capillary Refill: Capillary refill takes less than 2 seconds.  Neurological:     Mental Status: She is alert and oriented to person, place, and time.     Cranial Nerves: No cranial nerve deficit.  Psychiatric:        Mood and Affect: Mood is anxious.        Speech: Speech normal.        Behavior: Behavior normal.        Thought Content: Thought content normal.        Judgment: Judgment normal.   Assessment/Plan: 1. Low back pain, unspecified back pain laterality, unspecified chronicity, unspecified whether sciatica present Add tizanidine 623m. She may take 1/2 to 1 tablet twice daily as needed for muscle pain and spasms. Recommended she use heating pad to affected area as needed to help reduce pain. Will get x-ray of lumbar spine for further evaluation.  - tiZANidine (ZANAFLEX) 4 MG tablet; Take 1 tablet (4 mg total) by mouth 2 (two) times daily as needed for muscle spasms.  Dispense: 30 tablet; Refill: 1 - DG Lumbar Spine 2-3 Views; Future  2. Urinary tract infection without hematuria, site unspecified - POCT Urinalysis Dipstick negative for infection or other abnormalities today. Start macrobid 10523mtwice daily for 10 days. Will adjust based on results of culture and sensitivity.  - CULTURE, URINE COMPREHENSIVE - nitrofurantoin, macrocrystal-monohydrate, (MACROBID) 100 MG capsule; Take 1 capsule (100 mg total) by mouth 2 (two) times daily.  Dispense: 20 capsule; Refill: 0  3. Bacterial vaginitis - metroNIDAZOLE (METROGEL) 0.75 % vaginal gel; Place 1 Applicatorful vaginally at bedtime.  Dispense: 70 g; Refill: 1  4. Anxiety May take alprazolam 0.23m70mt bedtime as needed for insomnia/anxiety. - ALPRAZolam (XANAX) 0.5 MG tablet; Take 1 tablet (0.5 mg total) by mouth at bedtime as needed for anxiety.  Dispense: 30 tablet; Refill: 2  5. Atrial fibrillation, unspecified [facility]  with [facility] as scheduled.   6. Pedal edema No changes to dose of furosemide today. Advised she discuss this with cardiology.   7. Dysuria - POCT Urinalysis Dipstick - CULTURE, URINE COMPREHENSIVE - phenazopyridine (PYRIDIUM) 200 MG tablet; Take 1 tablet (200 mg total) by mouth 3 (three) times daily as needed for pain.  Dispense: 10 tablet; Refill: 0  General Counseling: Jettie verbalizes understanding of the findings of todays visit and agrees with plan of treatment. I have discussed any further diagnostic evaluation that may be needed or ordered today. We also reviewed her medications today. she has been encouraged to call the office with any questions or concerns that should arise related to todays visit.  This patient was seen by HeaLeretha PolP Collaboration with Dr FozLavera Guise a part of collaborative care agreement  Orders Placed This Encounter  Procedures  . CULTURE, URINE COMPREHENSIVE  . DG Lumbar Spine 2-3 Views  . POCT Urinalysis Dipstick    Meds ordered this encounter  Medications  . nitrofurantoin, macrocrystal-monohydrate, (MACROBID) 100 MG capsule    Sig: Take 1 capsule (100 mg total) by mouth 2 (two) times daily.    Dispense:  20 capsule    Refill:  0    Order Specific Question:   Supervising Provider    Answer:  KHAN, FOZIA M [[id number]]  . phenazopyridine (PYRIDIUM) 200 MG tablet    Sig: Take 1 tablet (200 mg total) by mouth 3 (three) times daily as needed for pain.    Dispense:  10 tablet    Refill:  0    Order Specific Question:   Supervising Provider    Answer:   Lavera Guise [[id number]]  . ALPRAZolam (XANAX) 0.5 MG tablet    Sig: Take 1 tablet (0.5 mg total) by mouth at bedtime as needed for anxiety.    Dispense:  30 tablet    Refill:  2    Order Specific Question:   Supervising Provider    Answer:   Lavera Guise [[id number]]  . tiZANidine (ZANAFLEX) 4 MG tablet    Sig: Take 1 tablet (4 mg total) by mouth 2 (two) times daily as needed for muscle spasms.     Dispense:  30 tablet    Refill:  1    Order Specific Question:   Supervising Provider    Answer:   Lavera Guise [[id number]]  . metroNIDAZOLE (METROGEL) 0.75 % vaginal gel    Sig: Place 1 Applicatorful vaginally at bedtime.    Dispense:  70 g    Refill:  1    Order Specific Question:   Supervising Provider    Answer:   Lavera Guise [[id number]]    Time spent: 72 Minutes      Dr Lavera Guise Internal medicine

## 2018-06-21 ENCOUNTER — Other Ambulatory Visit: Payer: Self-pay | Admitting: Nurse Practitioner

## 2018-06-21 ENCOUNTER — Other Ambulatory Visit: Payer: Self-pay

## 2018-06-21 ENCOUNTER — Telehealth: Payer: Self-pay

## 2018-06-21 ENCOUNTER — Ambulatory Visit
Admission: RE | Admit: 2018-06-21 | Discharge: 2018-06-21 | Disposition: A | Discharge status: Home / Self Care | Payer: BLUE CROSS/BLUE SHIELD | Attending: Nurse Practitioner | Admitting: Nurse Practitioner

## 2018-06-21 ENCOUNTER — Ambulatory Visit
Admission: RE | Admit: 2018-06-21 | Discharge: 2018-06-21 | Disposition: A | Discharge status: Home / Self Care | Payer: BLUE CROSS/BLUE SHIELD | Source: Ambulatory Visit | Attending: Nurse Practitioner | Admitting: Nurse Practitioner

## 2018-06-21 DIAGNOSIS — M545 Low back pain, unspecified: Secondary | ICD-10-CM

## 2018-06-21 DIAGNOSIS — Z87442 Personal history of urinary calculi: Secondary | ICD-10-CM

## 2018-06-21 NOTE — Progress Notes (Signed)
Order for KUB placed due to flank pain and history of renal calculi.

## 2018-06-21 NOTE — Telephone Encounter (Signed)
Order for KUB placed due to flank pain and history of renal calculi.

## 2018-06-22 ENCOUNTER — Other Ambulatory Visit: Payer: Self-pay

## 2018-06-22 DIAGNOSIS — K219 Gastro-esophageal reflux disease without esophagitis: Secondary | ICD-10-CM

## 2018-06-22 LAB — CULTURE, URINE COMPREHENSIVE

## 2018-06-22 MED ORDER — OMEPRAZOLE 40 MG PO CPDR: 40.0000 mg | DELAYED_RELEASE_CAPSULE | Freq: Every day | ORAL | 1 refills | Status: DC

## 2018-06-23 ENCOUNTER — Ambulatory Visit
Admission: RE | Admit: 2018-06-23 | Discharge: 2018-06-23 | Disposition: A | Discharge status: Home / Self Care | Payer: BLUE CROSS/BLUE SHIELD | Attending: Nurse Practitioner | Admitting: Nurse Practitioner

## 2018-06-23 ENCOUNTER — Ambulatory Visit
Admission: RE | Admit: 2018-06-23 | Discharge: 2018-06-23 | Disposition: A | Discharge status: Home / Self Care | Payer: BLUE CROSS/BLUE SHIELD | Source: Ambulatory Visit | Attending: Nurse Practitioner | Admitting: Nurse Practitioner

## 2018-06-23 ENCOUNTER — Other Ambulatory Visit: Payer: Self-pay

## 2018-06-23 DIAGNOSIS — Z87442 Personal history of urinary calculi: Secondary | ICD-10-CM | POA: Insufficient documentation

## 2018-06-23 DIAGNOSIS — M545 Low back pain, unspecified: Secondary | ICD-10-CM

## 2018-06-23 DIAGNOSIS — N200 Calculus of kidney: Secondary | ICD-10-CM | POA: Diagnosis not present

## 2018-06-27 ENCOUNTER — Other Ambulatory Visit: Payer: Self-pay

## 2018-06-27 DIAGNOSIS — R600 Localized edema: Secondary | ICD-10-CM | POA: Insufficient documentation

## 2018-06-27 DIAGNOSIS — B9689 Other specified bacterial agents as the cause of diseases classified elsewhere: Secondary | ICD-10-CM | POA: Insufficient documentation

## 2018-06-27 DIAGNOSIS — N760 Acute vaginitis: Secondary | ICD-10-CM | POA: Insufficient documentation

## 2018-06-27 DIAGNOSIS — M545 Low back pain, unspecified: Secondary | ICD-10-CM | POA: Insufficient documentation

## 2018-07-03 ENCOUNTER — Other Ambulatory Visit: Payer: Self-pay

## 2018-07-03 ENCOUNTER — Encounter: Payer: Self-pay | Admitting: Nurse Practitioner

## 2018-07-03 ENCOUNTER — Ambulatory Visit: Payer: BC Managed Care – PPO | Admitting: Nurse Practitioner

## 2018-07-03 VITALS — Resp 16 | Ht 64.0 in

## 2018-07-03 DIAGNOSIS — N200 Calculus of kidney: Secondary | ICD-10-CM

## 2018-07-03 DIAGNOSIS — B3731 Acute candidiasis of vulva and vagina: Secondary | ICD-10-CM

## 2018-07-03 DIAGNOSIS — B373 Candidiasis of vulva and vagina: Secondary | ICD-10-CM

## 2018-07-03 DIAGNOSIS — R059 Cough, unspecified: Secondary | ICD-10-CM

## 2018-07-03 DIAGNOSIS — J069 Acute upper respiratory infection, unspecified: Secondary | ICD-10-CM

## 2018-07-03 DIAGNOSIS — R05 Cough: Secondary | ICD-10-CM

## 2018-07-03 MED ORDER — FLUCONAZOLE 150 MG PO TABS: ORAL_TABLET | ORAL | 0 refills | Status: DC

## 2018-07-03 MED ORDER — LEVOFLOXACIN 500 MG PO TABS: 500.0000 mg | ORAL_TABLET | Freq: Every day | ORAL | 0 refills | Status: DC

## 2018-07-03 MED ORDER — HYDROCOD POLST-CPM POLST ER 10-8 MG/5ML PO SUER: 5.0000 mL | Freq: Two times a day (BID) | ORAL | 0 refills | Status: DC | PRN

## 2018-07-03 NOTE — Progress Notes (Signed)
[facility] [place], [place] [address]  Internal MEDICINE  Telephone Visit  Patient Name: Leslie [name]  [id number]  [id number]  Date of Service: 07/03/2018  I connected with the patient at 12:56pm by telephone and verified the patients identity using two identifiers.   I discussed the limitations, risks, security and privacy concerns of performing an evaluation and management service by telephone and the availability of in person appointments. I also discussed with the patient that there may be a patient responsible charge related to the service.  The patient expressed understanding and agrees to proceed.    Chief Complaint  Patient presents with  . Telephone Assessment  . Telephone Screen  . Cough  . Sore Throat  . Sinus Problem    drainage  . Chills    The patient has been contacted via webcam for follow up visit due to concerns for spread of novel coronavirus. States that she has not been feeling well for the past few days. Congested, has scratchy throat. Coughing a lot. Keeping her awake. Feels tight in her chest. Denies fever. Denies nausea or vomiting. Has had some chills. Denies smoking. Has been four months since she smoked. She continues to have moderate low back pain, worse on the right sode. Nothing is making this pain better including pain medication. Reviewed results of lumbar spine x-ray as well as KUB. She has minimal degenerative disc disease in the spine. There is a 9X43m renal calculus in right kidney. She states that she has not seen urologist for this.      Current Medication: Outpatient Encounter Medications as of 07/03/2018  Medication Sig  . albuterol (VENTOLIN HFA) 108 (90 Base) MCG/ACT inhaler Inhale 2 puffs into the lungs every 4 (four) hours as needed for wheezing or shortness of breath.  . ALPRAZolam (XANAX) 0.5 MG tablet Take 1 tablet (0.5 mg total) by mouth at bedtime as needed for anxiety.  .Marland Kitchenatorvastatin (LIPITOR) 40 MG  tablet Take 1 tablet (40 mg total) by mouth daily at 6 PM.  . diltiazem (CARDIZEM CD) 240 MG 24 hr capsule Take 1 capsule (240 mg total) by mouth daily.  .Marland Kitchenestradiol (ESTRACE) 0.5 MG tablet Take 1 tablet (0.5 mg total) by mouth daily.  . furosemide (LASIX) 20 MG tablet Take 1 tablet (20 mg) by mouth once daily. You may take 1 extra tablet (20 mg) after lunch as needed for leg swelling  . metoprolol succinate (TOPROL-XL) 25 MG 24 hr tablet Take 1 tablet (25 mg total) by mouth daily. Take with or immediately following a meal.  . metroNIDAZOLE (METROGEL) 0.75 % vaginal gel Place 1 Applicatorful vaginally at bedtime.  . montelukast (SINGULAIR) 10 MG tablet Take 1 tablet (10 mg total) by mouth at bedtime.  . naproxen sodium (ALEVE) 220 MG tablet Take 440 mg by mouth daily as needed (pain).  . nitrofurantoin, macrocrystal-monohydrate, (MACROBID) 100 MG capsule Take 1 capsule (100 mg total) by mouth 2 (two) times daily.  .Marland Kitchenomeprazole (PRILOSEC) 40 MG capsule Take 1 capsule (40 mg total) by mouth daily.  . phenazopyridine (PYRIDIUM) 200 MG tablet Take 1 tablet (200 mg total) by mouth 3 (three) times daily as needed for pain.  . potassium chloride (K-DUR) 10 MEQ tablet Take 2 tablets (20 meq) by mouth once daily  . tiZANidine (ZANAFLEX) 4 MG tablet Take 1 tablet (4 mg total) by mouth 2 (two) times daily as needed for muscle spasms.  .Marland Kitchenwarfarin (COUMADIN) 2.5 MG tablet TAKE  1/2 TO 1 TABLET DAILY AS DIRECTED BY COUMADIN CLINIC.  Marland Kitchen chlorpheniramine-HYDROcodone (TUSSIONEX PENNKINETIC ER) 10-8 MG/5ML SUER Take 5 mLs by mouth every 12 (twelve) hours as needed for cough.  . fluconazole (DIFLUCAN) 150 MG tablet Take 1 tablet po once. May repeat dose in 3 days as needed for persistent symptoms.  Marland Kitchen levofloxacin (LEVAQUIN) 500 MG tablet Take 1 tablet (500 mg total) by mouth daily.   No facility-administered encounter medications on file as of 07/03/2018.     Surgical History: Past Surgical History:  Procedure  Laterality Date  . ABDOMINAL HYSTERECTOMY     total  . ABDOMINAL HYSTERECTOMY    . BREAST BIOPSY Bilateral 2012  . BREAST BIOPSY Right 08-07-12   fibroadenomatous changes and columnar cells  . BREAST BIOPSY  02/03/2015   stereo byrnett  . CHOLECYSTECTOMY N/A 02/27/2016   Procedure: LAPAROSCOPIC CHOLECYSTECTOMY;  Surgeon: Christene Lye, MD;  Location: [facility] ORS;  Service: General;  Laterality: N/A;  . JOINT REPLACEMENT Left   . KNEE CLOSED REDUCTION Left 04/15/2015   Procedure: CLOSED MANIPULATION KNEE;  Surgeon: Thornton Park, MD;  Location: [facility] ORS;  Service: Orthopedics;  Laterality: Left;  . KNEE SURGERY    . TEE WITHOUT CARDIOVERSION N/A 09/13/2017   Procedure: TRANSESOPHAGEAL ECHOCARDIOGRAM (TEE);  Surgeon: Nelva Bush, MD;  Location: [facility] ORS;  Service: Cardiovascular;  Laterality: N/A;  . TOTAL KNEE ARTHROPLASTY Left 12/25/2014   Procedure: TOTAL KNEE ARTHROPLASTY;  Surgeon: Thornton Park, MD;  Location: [facility] ORS;  Service: Orthopedics;  Laterality: Left;  . TUBAL LIGATION      Medical History: Past Medical History:  Diagnosis Date  . (HFpEF) heart failure with preserved ejection fraction ([place])    a. 08/2017 Echo: EF 55-60%.  Grade 2 diastolic dysfunction.  Moderate mitral stenosis.  . Allergy   . Anemia   . Anxiety   . BRCA negative 03/22/2013  . Bronchitis 02/19/2016   ON LEVAQUIN PO  . Chest tightness   . CHF (congestive heart failure) ([place])   . Cigarette smoker 09/11/2017   8-10 day  . GERD (gastroesophageal reflux disease)   . History of kidney stones   . Hyperlipidemia   . Hypertension   . Moderate mitral stenosis    a.  08/2017 TEE: EF 60 to 65%.  Moderate mitral stenosis.  Mean gradient 14 mmHg.  Valve area 2.59 cm by planimetry, 2.72 cm by pressure half-time.  . Palpitations   . Persistent atrial fibrillation    a. 08/2017 s/p TEE/DCCV; b. CHA2DS2VASc = 2-->warfarin.    Family History: Family History  Problem Relation Age of Onset  . Cancer  Mother 58       breast  . Cancer Maternal Aunt        breast  . Cancer Maternal Grandmother        breast    Social History   Socioeconomic History  . Marital status: Married    Spouse name: Not on file  . Number of children: Not on file  . Years of education: Not on file  . Highest education level: Not on file  Occupational History  . Not on file  Social Needs  . Financial resource strain: Not on file  . Food insecurity    Worry: Not on file    Inability: Not on file  . Transportation needs    Medical: Not on file    Non-medical: Not on file  Tobacco Use  . Smoking status: Former Smoker    Packs/day: 0.50  Years: 20.00    Pack years: 10.00    Types: Cigarettes    Quit date: 02/20/2018    Years since quitting: 0.3  . Smokeless tobacco: Never Used  . Tobacco comment: down to 2-3 daily  Substance and Sexual Activity  . Alcohol use: Yes    Alcohol/week: 2.0 standard drinks    Types: 2 Glasses of wine per week    Comment: wine occ  . Drug use: No  . Sexual activity: Not on file  Lifestyle  . Physical activity    Days per week: Not on file    Minutes per session: Not on file  . Stress: Not on file  Relationships  . Social Herbalist on phone: Not on file    Gets together: Not on file    Attends religious service: Not on file    Active member of club or organization: Not on file    Attends meetings of clubs or organizations: Not on file    Relationship status: Not on file  . Intimate partner violence    Fear of current or ex partner: Not on file    Emotionally abused: Not on file    Physically abused: Not on file    Forced sexual activity: Not on file  Other Topics Concern  . Not on file  Social History Narrative  . Not on file      Review of Systems  Constitutional: Positive for chills and fatigue. Negative for fever and unexpected weight change.  HENT: Positive for congestion, postnasal drip, rhinorrhea, sinus pressure and sinus pain.  Negative for sneezing and sore throat.        Scratchy throat.   Respiratory: Positive for cough and wheezing. Negative for chest tightness and shortness of breath.   Cardiovascular: Negative for chest pain and palpitations.  Gastrointestinal: Negative for abdominal pain, constipation, diarrhea, nausea and vomiting.  Endocrine: Negative for cold intolerance, heat intolerance, polydipsia and polyuria.  Genitourinary: Positive for flank pain.  Musculoskeletal: Positive for back pain. Negative for arthralgias, joint swelling and neck pain.  Skin: Negative for rash.  Allergic/Immunologic: Positive for environmental allergies.  Neurological: Positive for headaches. Negative for dizziness, tremors and numbness.  Hematological: Negative for adenopathy. Does not bruise/bleed easily.  Psychiatric/Behavioral: Negative for behavioral problems (Depression), dysphoric mood, sleep disturbance and suicidal ideas. The patient is not nervous/anxious.    Today's Vitals   07/03/18 1143  Resp: 16  Height: '5\' 4"'  (1.626 m)   Body mass index is 44.29 kg/m.  Observation/Objective:   The patient is alert and oriented. She is pleasant and answers all questions appropriately. Breathing is non-labored. She is in no acute distress at this time. The patient sounds nasally congested. She does have harsh, dry cough.   Assessment/Plan: 1. Acute upper respiratory infection Will start round levofloxacin 554m daily for 10 days. Rest and increase fluids. Use OTC medication to improve acute symptoms.  - levofloxacin (LEVAQUIN) 500 MG tablet; Take 1 tablet (500 mg total) by mouth daily.  Dispense: 10 tablet; Refill: 0  2. Cough tussionex may be taken twice daily as needed for cough. Advised patient not to overuse this medicine and not to mix with other medications or alcohol as it can cause respiratory distress, sleepiness or dizziness. Should also avoid driving. Patient voiced understanding and agreement.  -  chlorpheniramine-HYDROcodone (TUSSIONEX PENNKINETIC ER) 10-8 MG/5ML SUER; Take 5 mLs by mouth every 12 (twelve) hours as needed for cough.  Dispense: 115 mL; Refill: 0  3. Vaginal candidiasis Diflucan may be taken as needed and as prescribed if yeast infection develops.  - fluconazole (DIFLUCAN) 150 MG tablet; Take 1 tablet po once. May repeat dose in 3 days as needed for persistent symptoms.  Dispense: 3 tablet; Refill: 0  4. Renal calculus, right Reviewed abdominal x-ray. Patient has 9 X 6 mm nonobstructive calculus in right kidney. Refer to urology for further evaluation and treatment.  - Ambulatory referral to Urology  General Counseling: Tylesha verbalizes understanding of the findings of today's phone visit and agrees with plan of treatment. I have discussed any further diagnostic evaluation that may be needed or ordered today. We also reviewed her medications today. she has been encouraged to call the office with any questions or concerns that should arise related to todays visit.  Rest and increase fluids. Continue using OTC medication to control symptoms.   This patient was seen by Leretha Pol FNP Collaboration with Dr Lavera Guise as a part of collaborative care agreement  Orders Placed This Encounter  Procedures  . Ambulatory referral to Urology    Meds ordered this encounter  Medications  . chlorpheniramine-HYDROcodone (TUSSIONEX PENNKINETIC ER) 10-8 MG/5ML SUER    Sig: Take 5 mLs by mouth every 12 (twelve) hours as needed for cough.    Dispense:  115 mL    Refill:  0    Order Specific Question:   Supervising Provider    Answer:   Lavera Guise [[id number]]  . levofloxacin (LEVAQUIN) 500 MG tablet    Sig: Take 1 tablet (500 mg total) by mouth daily.    Dispense:  10 tablet    Refill:  0    Order Specific Question:   Supervising Provider    Answer:   Lavera Guise [[id number]]  . fluconazole (DIFLUCAN) 150 MG tablet    Sig: Take 1 tablet po once. May repeat dose in 3 days as  needed for persistent symptoms.    Dispense:  3 tablet    Refill:  0    Order Specific Question:   Supervising Provider    Answer:   Lavera Guise [[id number]]    Time spent: 33 Minutes    Dr Lavera Guise Internal medicine

## 2018-07-04 ENCOUNTER — Other Ambulatory Visit: Payer: Self-pay

## 2018-07-04 DIAGNOSIS — M545 Low back pain, unspecified: Secondary | ICD-10-CM

## 2018-07-04 MED ORDER — TIZANIDINE HCL 4 MG PO TABS: 4.0000 mg | ORAL_TABLET | Freq: Two times a day (BID) | ORAL | 1 refills | Status: DC | PRN

## 2018-07-18 ENCOUNTER — Ambulatory Visit (INDEPENDENT_AMBULATORY_CARE_PROVIDER_SITE_OTHER): Payer: Managed Care, Other (non HMO) | Admitting: Urology

## 2018-07-18 ENCOUNTER — Telehealth: Payer: Self-pay | Admitting: Cardiovascular Disease

## 2018-07-18 ENCOUNTER — Ambulatory Visit
Admission: RE | Admit: 2018-07-18 | Discharge: 2018-07-18 | Disposition: A | Discharge status: Home / Self Care | Payer: Managed Care, Other (non HMO) | Source: Ambulatory Visit | Attending: Urology | Admitting: Urology

## 2018-07-18 ENCOUNTER — Other Ambulatory Visit: Payer: Self-pay

## 2018-07-18 ENCOUNTER — Encounter: Payer: Self-pay | Admitting: Urology

## 2018-07-18 DIAGNOSIS — R1011 Right upper quadrant pain: Secondary | ICD-10-CM | POA: Diagnosis present

## 2018-07-18 DIAGNOSIS — N200 Calculus of kidney: Secondary | ICD-10-CM | POA: Diagnosis not present

## 2018-07-18 NOTE — Telephone Encounter (Signed)
Patient with diagnosis of afib on warfarin for anticoagulation.    Procedure: Right Knee Arthroscopy Date of procedure: ASAP  CHADS2-VASc score of  4 (CHF, HTN, AGE, DM2, stroke/tia x 2, CAD, AGE, female)  Per office protocol, patient can hold warfarin for 3-5 days prior to procedure.

## 2018-07-18 NOTE — Telephone Encounter (Signed)
Received surgical clearance request, but with no information. Faxed request for DETAILED information to [facility] Dr. Clydell Hakim office.

## 2018-07-18 NOTE — Telephone Encounter (Signed)
   Primary Cardiologist: Ida Rogue, MD  Chart reviewed as part of pre-operative protocol coverage. Patient was contacted 07/18/2018 in reference to pre-operative risk assessment for pending surgery as outlined below.  Leslie [name] was last seen on 04/10/18 by Dr. Rockey Situ.  Since that day, Leslie [name] has done well. Her mobility is limited by knee pain, but she can complete more than 4.0 METS and denies anginal symptoms. She denies DOB and DOE, no orthopnea or lower extremity swelling. She is stable on her current diuretic regimen.   Per our pharmacy staff: Patient with diagnosis of afib on warfarin for anticoagulation.    Procedure: Right Knee Arthroscopy Date of procedure: ASAP  CHADS2-VASc score of  4 (CHF, HTN, AGE, DM2, stroke/tia x 2, CAD, AGE, female)  Per office protocol, patient can hold warfarin for 3-5 days prior to procedure.   Therefore, based on ACC/AHA guidelines, the patient would be at acceptable risk for the planned procedure without further cardiovascular testing.   I will route this recommendation to the requesting party via Epic fax function and remove from pre-op pool.  Please call with questions.  [place], PA 07/18/2018, 11:03 AM

## 2018-07-18 NOTE — Telephone Encounter (Signed)
Please comment on coumadin. 

## 2018-07-18 NOTE — Addendum Note (Signed)
Addended by: Verlene Mayer A on: 07/18/2018 01:46 PM   Modules accepted: Orders

## 2018-07-18 NOTE — Progress Notes (Signed)
07/18/2018 1:20 PM   Leslie [name] [birth year] [id number]  Referring provider: Ronnell Freshwater, NP [address] [place],  [place] [address]  CC: Nephrolithiasis  HPI: I saw Leslie [name] in urology clinic today in consultation for nephrolithiasis from Leretha Pol, NP.  She is a 53 year old African-American female with history of atrial fibrillation on anticoagulation with Coumadin who presents with a few months of bilateral low back pain.  She had a KUB performed on 06/23/2018 that suggested a 9 mm stone over the renal calculus.  She denies any fevers, chills, or dysuria.  She has a history of nephrolithiasis a few years ago treated with shockwave lithotripsy by Dr. Yves Dill.  She reportedly had a significant amount of pain during this procedure and did not tolerate it well.  There are no aggravating or alleviating factors.  Severity is mild to moderate.  She does consume large amounts of spinach.  Urinalysis today 0-5 WBCs, 0 RBCs, no bacteria, nitrite negative.   PMH: Past Medical History:  Diagnosis Date  . (HFpEF) heart failure with preserved ejection fraction ([place])    a. 08/2017 Echo: EF 55-60%.  Grade 2 diastolic dysfunction.  Moderate mitral stenosis.  . Allergy   . Anemia   . Anxiety   . BRCA negative 03/22/2013  . Bronchitis 02/19/2016   ON LEVAQUIN PO  . Chest tightness   . CHF (congestive heart failure) ([place])   . Cigarette smoker 09/11/2017   8-10 day  . GERD (gastroesophageal reflux disease)   . History of kidney stones   . Hyperlipidemia   . Hypertension   . Moderate mitral stenosis    a.  08/2017 TEE: EF 60 to 65%.  Moderate mitral stenosis.  Mean gradient 14 mmHg.  Valve area 2.59 cm by planimetry, 2.72 cm by pressure half-time.  . Palpitations   . Persistent atrial fibrillation    a. 08/2017 s/p TEE/DCCV; b. CHA2DS2VASc = 2-->warfarin.    Surgical History: Past Surgical History:  Procedure Laterality Date  . ABDOMINAL HYSTERECTOMY     total  . ABDOMINAL  HYSTERECTOMY    . BREAST BIOPSY Bilateral 2012  . BREAST BIOPSY Right 08-07-12   fibroadenomatous changes and columnar cells  . BREAST BIOPSY  02/03/2015   stereo byrnett  . CHOLECYSTECTOMY N/A 02/27/2016   Procedure: LAPAROSCOPIC CHOLECYSTECTOMY;  Surgeon: Christene Lye, MD;  Location: [facility] ORS;  Service: General;  Laterality: N/A;  . JOINT REPLACEMENT Left   . KNEE CLOSED REDUCTION Left 04/15/2015   Procedure: CLOSED MANIPULATION KNEE;  Surgeon: Thornton Park, MD;  Location: [facility] ORS;  Service: Orthopedics;  Laterality: Left;  . KNEE SURGERY    . TEE WITHOUT CARDIOVERSION N/A 09/13/2017   Procedure: TRANSESOPHAGEAL ECHOCARDIOGRAM (TEE);  Surgeon: Nelva Bush, MD;  Location: [facility] ORS;  Service: Cardiovascular;  Laterality: N/A;  . TOTAL KNEE ARTHROPLASTY Left 12/25/2014   Procedure: TOTAL KNEE ARTHROPLASTY;  Surgeon: Thornton Park, MD;  Location: [facility] ORS;  Service: Orthopedics;  Laterality: Left;  . TUBAL LIGATION      Allergies:  Allergies  Allergen Reactions  . Morphine Hives and Rash  . Oxycodone Hcl Hives and Itching  . Dilaudid [Hydromorphone Hcl] Itching    Family History: Family History  Problem Relation Age of Onset  . Cancer Mother 75       breast  . Cancer Maternal Aunt        breast  . Cancer Maternal Grandmother        breast    Social History:  reports that she quit smoking about 4 months ago. Her smoking use included cigarettes. She has a 10.00 pack-year smoking history. She has never used smokeless tobacco. She reports current alcohol use of about 2.0 standard drinks of alcohol per week. She reports that she does not use drugs.  ROS: Please see flowsheet from today's date for complete review of systems.  Physical Exam: BP (!) 142/78 (BP Location: Left Arm, Patient Position: Sitting)   Pulse 85   Ht '5\' 4"'  (1.626 m)   Wt 238 lb (108 kg)   BMI 40.85 kg/m    Constitutional:  Alert and oriented, No acute distress. Cardiovascular: No clubbing,  cyanosis, or edema. Respiratory: Normal respiratory effort, no increased work of breathing. GI: Abdomen is soft, nontender, nondistended, no abdominal masses GU: No CVA tenderness Lymph: No cervical or inguinal lymphadenopathy. Skin: No rashes, bruises or suspicious lesions. Neurologic: Grossly intact, no focal deficits, moving all 4 extremities. Psychiatric: Normal mood and affect.  Laboratory Data: Reviewed  Urinalysis today 0-5 WBCs, 0 RBCs, no bacteria, nitrite negative  Pertinent Imaging: I have personally reviewed the KUB dated 06/23/2018, 9 mm calcification projects over the right kidney No cross-sectional imaging to review  Assessment & Plan:   In summary, the patient is a 53 year old female with nonspecific bilateral low back pain, and KUB 06/23/2018 suggesting a 9 mm stone over the right renal shadow.  I recommended CT abdomen pelvis without contrast to better evaluate stone size, density, and location.  We discussed that non-obstructing stones can typically be monitored, and would be very unlikely to be the source of her pain.  We discussed various treatment options for urolithiasis including observation with or without medical expulsive therapy, shockwave lithotripsy (SWL), ureteroscopy and laser lithotripsy with stent placement, and percutaneous nephrolithotomy.  We discussed that management is based on stone size, location, density, patient co-morbidities, and patient preference.   Stones <61m in size have a >80% spontaneous passage rate. Data surrounding the use of tamsulosin for medical expulsive therapy is controversial, but meta analyses suggests it is most efficacious for distal stones between 5-165min size. Possible side effects include dizziness/lightheadedness, and retrograde ejaculation.  SWL has a lower stone free rate in a single procedure, but also a lower complication rate compared to ureteroscopy and avoids a stent and associated stent related symptoms. Possible  complications include renal hematoma, steinstrasse, and need for additional treatment.  Ureteroscopy with laser lithotripsy and stent placement has a higher stone free rate than SWL in a single procedure, however increased complication rate including possible infection, ureteral injury, bleeding, and stent related morbidity. Common stent related symptoms include dysuria, urgency/frequency, and flank pain.  She would like to avoid shockwave lithotripsy after her previous experience with severe pain during the procedure.  CT stone protocol to evaluate stone size, location, and density Consider elective ureteroscopy if renal pelvis or obstructing stone, versus surveillance if lower pole location Will call with CT results  BrBilley CoMD  [id number] [phone]

## 2018-07-18 NOTE — Patient Instructions (Signed)
Ureteral Stent Implantation  Ureteral stent implantation is a procedure to insert (implant) a flexible, soft, plastic tube (stent) into a ureter. Ureters are the tube-like parts of the body that drain urine from the kidneys. The stent supports the ureter while it heals and helps to drain urine. You may have a ureteral stent implanted after having a procedure to remove a blockage from the ureter (ureterolysis or pyeloplasty). You may also have a stent implanted to open the flow of urine when you have a blockage caused by a kidney stone, tumor, blood clot, or infection. You have two ureters, one on each side of the body. The ureters connect the kidneys to the organ that holds urine until it passes out of the body (bladder). The stent is placed so that one end is in the kidney, and one end is in the bladder. The stent is usually taken out after your ureter has healed. Depending on your condition, you may have a stent for just a few weeks, or you may have a long-term stent that will need to be replaced every few months. Tell a health care provider about:  Any allergies you have.  All medicines you are taking, including vitamins, herbs, eye drops, creams, and over-the-counter medicines.  Any problems you or family members have had with anesthetic medicines.  Any blood disorders you have.  Any surgeries you have had.  Any medical conditions you have.  Whether you are pregnant or may be pregnant. What are the risks? Generally, this is a safe procedure. However, problems may occur, including:  Infection.  Bleeding.  Allergic reactions to medicines.  Damage to other structures or organs. Tearing (perforation) of the ureter is possible.  Movement of the stent away from where it is placed during surgery (migration). What happens before the procedure? Medicines Ask your health care provider about:  Changing or stopping your regular medicines. This is especially important if you are taking  diabetes medicines or blood thinners.  Taking medicines such as aspirin and ibuprofen. These medicines can thin your blood. Do not take these medicines unless your health care provider tells you to take them.  Taking over-the-counter medicines, vitamins, herbs, and supplements. Eating and drinking Follow instructions from your health care provider about eating and drinking, which may include:  8 hours before the procedure - stop eating heavy meals or foods, such as meat, fried foods, or fatty foods.  6 hours before the procedure - stop eating light meals or foods, such as toast or cereal.  6 hours before the procedure - stop drinking milk or drinks that contain milk.  2 hours before the procedure - stop drinking clear liquids. Staying hydrated Follow instructions from your health care provider about hydration, which may include:  Up to 2 hours before the procedure - you may continue to drink clear liquids, such as water, clear fruit juice, black coffee, and plain tea. General instructions  Do not drink alcohol.  Do not use any products that contain nicotine or tobacco for at least 4 weeks before the procedure. These products include cigarettes, e-cigarettes, and chewing tobacco. If you need help quitting, ask your health care provider.  You may have an exam or testing, such as imaging or blood tests.  Ask your health care provider what steps will be taken to help prevent infection. These may include: ? Removing hair at the surgery site. ? Washing skin with a germ-killing soap. ? Taking antibiotic medicine.  Plan to have someone take you home   from the hospital or clinic.  If you will be going home right after the procedure, plan to have someone with you for 24 hours. What happens during the procedure?  An IV will be inserted into one of your veins.  You may be given a medicine to help you relax (sedative).  You may be given a medicine to make you fall asleep (general  anesthetic).  A thin, tube-shaped instrument with a light and tiny camera at the end (cystoscope) will be inserted into your urethra. The urethra is the tube that drains urine from the bladder out of the body. In men, the urethra opens at the end of the penis. In women, the urethra opens in front of the vaginal opening.  The cystoscope will be passed into your bladder.  A thin wire (guide wire) will be passed through your bladder and into your ureter. This is used to guide the stent into your ureter.  The stent will be inserted into your ureter.  The guide wire and the cystoscope will be removed.  A flexible tube (catheter) may be inserted through your urethra so that one end is in your bladder. This helps to drain urine from your bladder. The procedure may vary among hospitals and health care providers. What happens after the procedure?  Your blood pressure, heart rate, breathing rate, and blood oxygen level will be monitored until you leave the hospital or clinic.  You may continue to receive medicine and fluids through an IV.  You may have some soreness or pain in your abdomen and urethra. Medicines will be available to help you.  You will be encouraged to get up and walk around as soon as you can.  You may have a catheter draining your urine.  You will have some blood in your urine.  Do not drive for 24 hours if you were given a sedative during your procedure. Summary  Ureteral stent implantation is a procedure to insert a flexible, soft, plastic tube (stent) into a ureter.  You may have a stent implanted to support the ureter while it heals after a procedure or to open the flow of urine if there is a blockage.  Follow instructions from your health care provider about taking medicines and about eating and drinking before the procedure.  Depending on your condition, you may have a stent for just a few weeks, or you may have a long-term stent that will need to be replaced every  few months. This information is not intended to replace advice given to you by your health care provider. Make sure you discuss any questions you have with your health care provider. Document Released: 01/02/2000 Document Revised: 10/11/2017 Document Reviewed: 10/12/2017 Elsevier Patient Education  2020 [employer]. Ureteroscopy, Care After This sheet gives you information about how to care for yourself after your procedure. Your health care provider may also give you more specific instructions. If you have problems or questions, contact your health care provider. What can I expect after the procedure? After the procedure, it is common to have:  A burning sensation when you urinate.  Blood in your urine.  Mild discomfort in the bladder area or kidney area when urinating.  Needing to urinate more often or urgently. Follow these instructions at home:  Medicines  Take over-the-counter and prescription medicines only as told by your health care provider.  If you were prescribed an antibiotic medicine, take it as told by your health care provider. Do not stop taking the antibiotic  even if you start to feel better. General instructions  Donot drive for 24 hours if you were given a medicine to help you relax (sedative) during your procedure.  To relieve burning, try taking a warm bath or holding a warm washcloth over your groin.  Drink enough fluid to keep your urine clear or pale yellow. ? Drink two 8-ounce glasses of water every hour for the first 2 hours after you get home. ? Continue to drink water often at home.  You can eat what you usually do.  Keep all follow-up visits as told by your health care provider. This is important. ? If you had a tube placed to keep urine flowing (ureteral stent), ask your health care provider when you need to return to have it removed. Contact a health care provider if:  You have chills or a fever.  You have burning pain for longer than 24  hours after the procedure.  You have blood in your urine for longer than 24 hours after the procedure. Get help right away if:  You have large amounts of blood in your urine.  You have blood clots in your urine.  You have very bad pain.  You have chest pain or trouble breathing.  You are unable to urinate and you have the feeling of a full bladder. This information is not intended to replace advice given to you by your health care provider. Make sure you discuss any questions you have with your health care provider. Document Released: 01/09/2013 Document Revised: 12/17/2016 Document Reviewed: 10/17/2015 Elsevier Patient Education  2020 [employer]. Ureteroscopy Ureteroscopy is a procedure to check for and treat problems inside part of the urinary tract. In this procedure, a thin, tube-shaped instrument with a light at the end (ureteroscope) is used to look at the inside of the kidneys and the ureters, which are the tubes that carry urine from the kidneys to the bladder. The ureteroscope is inserted into one or both of the ureters. You may need this procedure if you have frequent urinary tract infections (UTIs), blood in your urine, or a stone in one of your ureters. A ureteroscopy can be done to find the cause of urine blockage in a ureter and to evaluate other abnormalities inside the ureters or kidneys. If stones are found, they can be removed during the procedure. Polyps, abnormal tissue, and some types of tumors can also be removed or treated. The ureteroscope may also have a tool to remove tissue to be checked for disease under a microscope (biopsy). Tell a health care provider about:  Any allergies you have.  All medicines you are taking, including vitamins, herbs, eye drops, creams, and over-the-counter medicines.  Any problems you or family members have had with anesthetic medicines.  Any blood disorders you have.  Any surgeries you have had.  Any medical conditions you  have.  Whether you are pregnant or may be pregnant. What are the risks? Generally, this is a safe procedure. However, problems may occur, including:  Bleeding.  Infection.  Allergic reactions to medicines.  Scarring that narrows the ureter (stricture).  Creating a hole in the ureter (perforation). What happens before the procedure? Staying hydrated Follow instructions from your health care provider about hydration, which may include:  Up to 2 hours before the procedure - you may continue to drink clear liquids, such as water, clear fruit juice, black coffee, and plain tea. Eating and drinking restrictions Follow instructions from your health care provider about eating and  drinking, which may include:  8 hours before the procedure - stop eating heavy meals or foods such as meat, fried foods, or fatty foods.  6 hours before the procedure - stop eating light meals or foods, such as toast or cereal.  6 hours before the procedure - stop drinking milk or drinks that contain milk.  2 hours before the procedure - stop drinking clear liquids. Medicines  Ask your health care provider about: ? Changing or stopping your regular medicines. This is especially important if you are taking diabetes medicines or blood thinners. ? Taking medicines such as aspirin and ibuprofen. These medicines can thin your blood. Do not take these medicines before your procedure if your health care provider instructs you not to.  You may be given antibiotic medicine to help prevent infection. General instructions  You may have a urine sample taken to check for infection.  Plan to have someone take you home from the hospital or clinic. What happens during the procedure?   To reduce your risk of infection: ? Your health care team will wash or sanitize their hands. ? Your skin will be washed with soap.  An IV tube will be inserted into one of your veins.  You will be given one of the following: ? A  medicine to help you relax (sedative). ? A medicine to make you fall asleep (general anesthetic). ? A medicine that is injected into your spine to numb the area below and slightly above the injection site (spinal anesthetic).  To lower your risk of infection, you may be given an antibiotic medicine by an injection or through the IV tube.  The opening from which you urinate (urethra) will be cleaned with a germ-killing solution.  The ureteroscope will be passed through your urethra into your bladder.  A salt-water solution will flow through the ureteroscope to fill your bladder. This will help the health care provider see the openings of your ureters more clearly.  Then, the ureteroscope will be passed into your ureter. ? If a growth is found, a piece of it may be removed so it can be examined under a microscope (biopsy). ? If a stone is found, it may be removed through the ureteroscope, or the stone may be broken up using a laser, shock waves, or electrical energy. ? In some cases, if the ureter is too small, a tube may be inserted that keeps the ureter open (ureteral stent). The stent may be left in place for 1 or 2 weeks to keep the ureter open, and then the ureteroscopy procedure will be performed.  The scope will be removed, and your bladder will be emptied. The procedure may vary among health care providers and hospitals. What happens after the procedure?  Your blood pressure, heart rate, breathing rate, and blood oxygen level will be monitored until the medicines you were given have worn off.  You may be asked to urinate.  Donot drive for 24 hours if you were given a sedative. This information is not intended to replace advice given to you by your health care provider. Make sure you discuss any questions you have with your health care provider. Document Released: 01/09/2013 Document Revised: 12/17/2016 Document Reviewed: 10/17/2015 Elsevier Patient Education  2020 [employer].

## 2018-07-18 NOTE — Telephone Encounter (Signed)
° °  [facility] Pre-operative Risk Assessment    Request for surgical clearance:  1. What type of surgery is being performed? Right Knee Arthroscopy    2. When is this surgery scheduled? ASAP   3. What type of clearance is required (medical clearance vs. Pharmacy clearance to hold med vs. Both)? Both - needs to know breathing has improved  4. Are there any medications that need to be held prior to surgery and how long? Coumadin    5. Practice name and name of physician performing surgery? [facility] - Dr Marry Guan   6. What is your office phone number [phone]    7.   What is your office fax number [phone]  8.   Anesthesia type (None, local, MAC, general) ? Not listed   Ace Gins 07/18/2018, 9:24 AM  _________________________________________________________________ Dr Marry Guan calling.  States he is ready to have this surgery r/s'ed ASAP but is now awaiting clearance from Dr Rockey Situ.  Refer back to phone note on 02/23/2018 for original clearance notes.  Please call Dr Marry Guan with any additional questions.    (provider comments below)

## 2018-07-19 ENCOUNTER — Other Ambulatory Visit: Payer: Self-pay

## 2018-07-19 LAB — MICROSCOPIC EXAMINATION
Bacteria, UA: NONE SEEN
RBC, Urine: NONE SEEN /hpf (ref 0–2)

## 2018-07-19 LAB — URINALYSIS, COMPLETE
Bilirubin, UA: NEGATIVE
Glucose, UA: NEGATIVE
Ketones, UA: NEGATIVE
Leukocytes,UA: NEGATIVE
Nitrite, UA: NEGATIVE
Protein,UA: NEGATIVE
RBC, UA: NEGATIVE
Specific Gravity, UA: 1.015 (ref 1.005–1.030)
Urobilinogen, Ur: 0.2 mg/dL (ref 0.2–1.0)
pH, UA: 6 (ref 5.0–7.5)

## 2018-07-19 MED ORDER — ESTRADIOL 0.5 MG PO TABS: 0.5000 mg | ORAL_TABLET | Freq: Every day | ORAL | 1 refills | Status: DC

## 2018-07-28 ENCOUNTER — Telehealth: Payer: Self-pay | Admitting: Cardiovascular Disease

## 2018-07-28 NOTE — Telephone Encounter (Signed)

## 2018-07-31 ENCOUNTER — Ambulatory Visit (INDEPENDENT_AMBULATORY_CARE_PROVIDER_SITE_OTHER): Payer: Managed Care, Other (non HMO)

## 2018-07-31 ENCOUNTER — Other Ambulatory Visit: Payer: Self-pay

## 2018-07-31 DIAGNOSIS — Z5181 Encounter for therapeutic drug level monitoring: Secondary | ICD-10-CM

## 2018-07-31 DIAGNOSIS — I050 Rheumatic mitral stenosis: Secondary | ICD-10-CM

## 2018-07-31 DIAGNOSIS — I4891 Unspecified atrial fibrillation: Secondary | ICD-10-CM | POA: Diagnosis not present

## 2018-07-31 LAB — POCT INR: INR: 2.8 (ref 2.0–3.0)

## 2018-07-31 NOTE — Patient Instructions (Signed)
Please continue of 1 tablet (2.5 mg) every day EXCEPT 1/2 TABLET ON MONDAYS, Lindsay. Recheck in 5 weeks - 1 week before knee surgery on 8/24.

## 2018-08-04 ENCOUNTER — Other Ambulatory Visit: Payer: Self-pay

## 2018-08-04 MED ORDER — ESTRADIOL 0.5 MG PO TABS: 0.5000 mg | ORAL_TABLET | Freq: Every day | ORAL | 1 refills | Status: DC

## 2018-08-15 ENCOUNTER — Other Ambulatory Visit: Payer: Self-pay

## 2018-08-15 ENCOUNTER — Encounter: Payer: Self-pay | Admitting: Physician Assistant

## 2018-08-15 ENCOUNTER — Telehealth: Payer: Self-pay | Admitting: Cardiovascular Disease

## 2018-08-15 ENCOUNTER — Other Ambulatory Visit
Admission: RE | Admit: 2018-08-15 | Discharge: 2018-08-15 | Disposition: A | Discharge status: Home / Self Care | Payer: Managed Care, Other (non HMO) | Source: Ambulatory Visit | Attending: Physician Assistant | Admitting: Physician Assistant

## 2018-08-15 ENCOUNTER — Encounter: Payer: Self-pay | Admitting: General Surgery

## 2018-08-15 ENCOUNTER — Ambulatory Visit (INDEPENDENT_AMBULATORY_CARE_PROVIDER_SITE_OTHER): Payer: Managed Care, Other (non HMO) | Admitting: Family

## 2018-08-15 VITALS — BP 120/64 | HR 61 | Temp 97.9°F | Ht 64.0 in | Wt 262.2 lb

## 2018-08-15 DIAGNOSIS — I5032 Chronic diastolic (congestive) heart failure: Secondary | ICD-10-CM | POA: Insufficient documentation

## 2018-08-15 DIAGNOSIS — I5030 Unspecified diastolic (congestive) heart failure: Secondary | ICD-10-CM | POA: Insufficient documentation

## 2018-08-15 DIAGNOSIS — I050 Rheumatic mitral stenosis: Secondary | ICD-10-CM

## 2018-08-15 DIAGNOSIS — R0600 Dyspnea, unspecified: Secondary | ICD-10-CM | POA: Diagnosis not present

## 2018-08-15 DIAGNOSIS — Z7901 Long term (current) use of anticoagulants: Secondary | ICD-10-CM | POA: Diagnosis not present

## 2018-08-15 DIAGNOSIS — I480 Paroxysmal atrial fibrillation: Secondary | ICD-10-CM

## 2018-08-15 DIAGNOSIS — I2510 Atherosclerotic heart disease of native coronary artery without angina pectoris: Secondary | ICD-10-CM

## 2018-08-15 LAB — BASIC METABOLIC PANEL
Anion gap: 11 (ref 5–15)
BUN: 24 mg/dL — ABNORMAL HIGH (ref 6–20)
CO2: 26 mmol/L (ref 22–32)
Calcium: 9.2 mg/dL (ref 8.9–10.3)
Chloride: 101 mmol/L (ref 98–111)
Creatinine, Ser: 1.25 mg/dL — ABNORMAL HIGH (ref 0.44–1.00)
GFR calc Af Amer: 57 mL/min — ABNORMAL LOW (ref >60)
GFR calc non Af Amer: 49 mL/min — ABNORMAL LOW (ref >60)
Glucose, Bld: 104 mg/dL — ABNORMAL HIGH (ref 70–99)
Potassium: 4.4 mmol/L (ref 3.5–5.1)
Sodium: 138 mmol/L (ref 135–145)

## 2018-08-15 LAB — CBC
HCT: 32.1 % — ABNORMAL LOW (ref 36.0–46.0)
Hemoglobin: 10 g/dL — ABNORMAL LOW (ref 12.0–15.0)
MCH: 27 pg (ref 26.0–34.0)
MCHC: 31.2 g/dL (ref 30.0–36.0)
MCV: 86.8 fL (ref 80.0–100.0)
Platelets: 395 10*3/uL (ref 150–400)
RBC: 3.7 MIL/uL — ABNORMAL LOW (ref 3.87–5.11)
RDW: 15.8 % — ABNORMAL HIGH (ref 11.5–15.5)
WBC: 6.1 10*3/uL (ref 4.0–10.5)
nRBC: 0 % (ref 0.0–0.2)

## 2018-08-15 LAB — MAGNESIUM: Magnesium: 2.4 mg/dL (ref 1.7–2.4)

## 2018-08-15 LAB — TSH: TSH: 0.857 u[IU]/mL (ref 0.350–4.500)

## 2018-08-15 LAB — BRAIN NATRIURETIC PEPTIDE: B Natriuretic Peptide: 216 pg/mL — ABNORMAL HIGH (ref 0.0–100.0)

## 2018-08-15 NOTE — Progress Notes (Signed)
Office Visit    Patient Name: Leslie [name] [facility] Date of Encounter: 08/15/2018  Primary Care Provider:  Ronnell Freshwater, NP Primary Cardiologist:  Ida Rogue, MD  Chief Complaint    53 yo female PMH non-obstructive CAD, HFpEF presents for chief complaint of weight gain and LE edema.   Past Medical History    Past Medical History:  Diagnosis Date  . (HFpEF) heart failure with preserved ejection fraction ([place])    a. 08/2017 Echo: EF 55-60%.  Grade 2 diastolic dysfunction.  Moderate mitral stenosis.  . Allergy   . Anemia   . Anxiety   . BRCA negative 03/22/2013  . Bronchitis 02/19/2016   ON LEVAQUIN PO  . Chest tightness   . CHF (congestive heart failure) ([place])   . Cigarette smoker 09/11/2017   8-10 day  . GERD (gastroesophageal reflux disease)   . History of kidney stones   . Hyperlipidemia   . Hypertension   . Interstitial lung disease ([place])    a. CT 2013 b. 02/2018 CXR noted recurrent intersistial changes ILD vs chronic bronchitis  . Moderate mitral stenosis    a.  08/2017 TEE: EF 60 to 65%.  Moderate mitral stenosis.  Mean gradient 14 mmHg.  Valve area 2.59 cm by planimetry, 2.72 cm by pressure half-time.  . Palpitations   . Persistent atrial fibrillation    a. 08/2017 s/p TEE/DCCV; b. CHA2DS2VASc = 2-->warfarin.   Past Surgical History:  Procedure Laterality Date  . ABDOMINAL HYSTERECTOMY     total  . ABDOMINAL HYSTERECTOMY    . BREAST BIOPSY Bilateral 2012  . BREAST BIOPSY Right 08-07-12   fibroadenomatous changes and columnar cells  . BREAST BIOPSY  02/03/2015   stereo byrnett  . CHOLECYSTECTOMY N/A 02/27/2016   Procedure: LAPAROSCOPIC CHOLECYSTECTOMY;  Surgeon: Christene Lye, MD;  Location: [facility] ORS;  Service: General;  Laterality: N/A;  . JOINT REPLACEMENT Left   . KNEE CLOSED REDUCTION Left 04/15/2015   Procedure: CLOSED MANIPULATION KNEE;  Surgeon: Thornton Park, MD;  Location: [facility] ORS;  Service: Orthopedics;  Laterality: Left;  .  KNEE SURGERY    . TEE WITHOUT CARDIOVERSION N/A 09/13/2017   Procedure: TRANSESOPHAGEAL ECHOCARDIOGRAM (TEE);  Surgeon: Nelva Bush, MD;  Location: [facility] ORS;  Service: Cardiovascular;  Laterality: N/A;  . TOTAL KNEE ARTHROPLASTY Left 12/25/2014   Procedure: TOTAL KNEE ARTHROPLASTY;  Surgeon: Thornton Park, MD;  Location: [facility] ORS;  Service: Orthopedics;  Laterality: Left;  . TUBAL LIGATION      Allergies  Allergies  Allergen Reactions  . Morphine Hives and Rash  . Oxycodone Hcl Hives and Itching  . Dilaudid [Hydromorphone Hcl] Itching    History of Present Illness    Leslie [name] is a 53 yo female with past cardiac history of nonobstructive CA by coronary CTA 02/2018, PAF on Coumadin, HFpEF, moderate mitral stenosis, pulmonary hypertension, HTN, HLD, tobacco abuse. Additional PMH includes obesity,OSA (not on CPAP), renal stones, anxiety. Last seen 04/10/2018 by Dr. Rockey Situ - documented weight 244 lbs, LE edema thought to be mulitfactorial HFpEF, morbid obesity with chronic venous insufficiency and atrial fib - recommended Lasix 20 daily with PRN PM dose sparingly. She presents today for chief complaint of weight gain and LE edema.   Atrial fib diagnosed 08/2016 in setting of hospitalization for chest tightness. TEE showed moderate mitral stenosis. Cardioverted. Anticoagulated on Coumadin in setting of valvular atrial fib. Office visit 12/2017 noting tachypalpitations HR 130s. Continued intermittent palpitation - Diltiazem increased to 249m daily. ED  01/17/18 K 2.8 with palpitations. Coronary CTA 02/2018 due to chest tightness with nonobstructive CAD including distal left main 0 to 25% calcification, 0 25% calcification in the LAD, trivial plaque within the LCx, calcium score of 10 which was in the 89th percentile for age and sex, dilated pulmonary artery measuring 35 mm suggestive of pulmonary pretension. Lipitor increased to 10m daily.  Late 12/2017 patient suffered a mechanical fall  tripping down a step and suffered a subsequent large hematoma along the left buttock and left lateral thigh. CT of the left lower extremity which showed a left-sided subcutaneous hematoma. Last noted atrial fib episode 12/2017. Zio monitor 01/2018 showed normal sinus rhythm with an average heart rate of 82 bpm, 5 SVT/atrial tachycardia events occurred with the fastest interval lasting 5 beats with a maximal rate of 145 bpm and the longest interval lasting 16 beats with an average rate of 114 bpm.  Patient triggered events were not associated with significant arrhythmia. ED visit in late 01/2018 with nonproductive cough, congestion, and scratchy throat with associated palpitations and shortness of breath.  BNP 269, troponin less than 0.032, potassium improved to 3.8.    Patient called our office this morning noting a weight gain of 12 pounds in the past 3 to 4 weeks. She was asked to come to the office for evaluation.  Weight today shows an 18 pound gain since last seen 04/10/2018.  She also reports increased lower extremity edema. She had increased her Lasix to 271mBID for the last 2 weeks though did not see any significant improvement in her lower extremity swelling or weight loss.  Her lower extremity edema is primarily in her ankles and feet.  Will resolve by morning and then get worse throughout the day.  She does not wear compression stockings.  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  Reports her feet will ache from the swelling.  She also noted a several day history of intermittent chest pain that would randomly occur and not associated with other symptoms.  Onset a few days ago, location under her left breast, duration a few hours, characteristic stabbing, aggravating factors not noted, relieving factors baby aspirin, does not occur at any particular time. States it happened a couple times over the last few days,  but has not occurred today.     She tells me she has been short of breath for the last 3 days. SOB with rest and with exertion.  She sleeps in the bed and has not required elevated pillows.  She does put pillows beneath her feet to elevate them while sleeping.  She endorses some PND, endorses waking feeling short of breath at night associated with feeling of her heart racing.  Otherwise reports her palpitations are no more frequent than previous.   Reports some lightheadedness. States she has felt "off balance" over the last few weeks. For example, occurred while standing pulling a dish from the upper cabinet.  EKGs/Labs/Other Studies Reviewed:   The following studies were reviewed today:  Coronary CTA 02/27/2018: 1. Coronary calcium score of 10. This was [id number] for age and sex matched control. 2. Normal coronary origin with right dominance. 3. Mild non-obstructive CAD. Aggressive risk factor modification is recommended. 4. Mild diffuse calcifications and atherosclerotic plaque. 5. Dilated pulmonary artery measuring 35 mm suggestive of pulmonary Hypertension. __________   ZIO monitor 01/2018: Event Monitor Normal sinus rhythm  Avg HR of 82 bpm.  5 Supraventricular Tachycardia/atrial tachycardia runs occurred, the run with the fastest interval lasting 5 beats with a max rate of 145 bpm, the longest lasting 16 beats with an avg rate of 114 bpm.  Isolated SVEs were rare (<1.0%), SVE Couplets were rare (<1.0%), and SVE Triplets were rare (<1.0%). Isolated VEs were rare (<1.0%), and no VE Couplets or VE Triplets were present. Inverted QRS complexes possibly due to inverted placement of device. Patient triggered events were not associated with significant arrhythmia. __________  TEE 09/13/2017: - Left ventricle: The cavity size was normal. Systolic function was   normal. The estimated ejection fraction was in the range of 60%   to 65%. - Aortic valve: No evidence of vegetation. There  was trivial   regurgitation. - Mitral valve: Mobility of the posterior leaflet was restricted.   No evidence of vegetation. Transvalvular velocity was increased.   The findings are consistent with moderate stenosis. Mean gradient   (D): 14 mm Hg. Planimetered valve area: 2.59 cm^2. Valve area by   pressure half-time: 2.72 cm^2. Valve area (PISA): 1.12 cm^2. - Left atrium: No evidence of thrombus in the atrial cavity or   appendage. - Right ventricle: The cavity size was normal. Systolic function   was normal. - Right atrium: No evidence of thrombus in the atrial cavity or   appendage. - Atrial septum: Doppler showed no atrial level shunt, in the   baseline state. - Tricuspid valve: No evidence of vegetation. - Pulmonic valve: No evidence of vegetation. __________  2D echo 09/12/2017: - Left ventricle: The cavity size was normal. Wall thickness was   normal. Systolic function was normal. The estimated ejection   fraction was in the range of 55% to 60%. Wall motion was normal;   there were no regional wall motion abnormalities. Features are   consistent with a pseudonormal left ventricular filling pattern,   with concomitant abnormal relaxation and increased filling   pressure (grade 2 diastolic dysfunction). - Mitral valve: Poorly visualized. The findings are consistent with   moderate stenosis. Mean gradient (D): 15 mm Hg. Valve area by   pressure half-time: 1.62 cm^2. Valve area by continuity equation   (using LVOT flow): 2.15 cm^2.  Impressions: - Significantly elevated mitral valve gradient but the valve is not   well visualized. Left atrial size is normal which argues against   severe stenosis. Consider a TEE if clinically indicated.  EKG:  EKG is ordered today.  The ekg ordered today demonstrates SR rate 61 with sinus arrthymia  Recent Labs: 01/06/2018: Magnesium 1.9 01/13/2018: TSH 0.411 02/07/2018: B Natriuretic Peptide 269.0 03/16/2018: BUN 17; Creatinine, Ser 1.10;  Hemoglobin 11.4; Platelets 496; Potassium 3.6; Sodium 138 04/07/2018: ALT 19  Recent Lipid Panel    Component Value Date/Time   CHOL 170 04/07/2018 0910   TRIG 136 04/07/2018 0910   HDL 34 (L) 04/07/2018 0910   CHOLHDL 5.0 (H) 04/07/2018 0910   LDLCALC 109 (H) 04/07/2018 0910    Home Medications   Prior to Admission medications   Medication Sig Start Date End Date Taking? Authorizing Provider  albuterol (VENTOLIN HFA) 108 (90 Base) MCG/ACT inhaler Inhale 2 puffs into the lungs every 4 (four) hours as needed for wheezing or shortness of breath. 06/16/18   Ronnell Freshwater, NP  ALPRAZolam Duanne Moron) 0.5 MG tablet Take 1 tablet (0.5 mg total) by mouth at bedtime as needed for anxiety. 06/19/18   Ronnell Freshwater, NP  atorvastatin (LIPITOR) 40  MG tablet Take 1 tablet (40 mg total) by mouth daily at 6 PM. 02/28/18   Theora Gianotti, NP  celecoxib (CELEBREX) 200 MG capsule  06/19/18   [provider]  chlorpheniramine-HYDROcodone (TUSSIONEX PENNKINETIC ER) 10-8 MG/5ML SUER Take 5 mLs by mouth every 12 (twelve) hours as needed for cough. 07/03/18   Ronnell Freshwater, NP  [facility] NICOTINE 21 MG/24HR patch PLACE 1 PATCH (21 MG TOTAL) ONTO THE SKIN DAILY. 04/07/18   [provider]  diltiazem (CARDIZEM CD) 240 MG 24 hr capsule Take 1 capsule (240 mg total) by mouth daily. 03/23/18   Minna Merritts, MD  estradiol (ESTRACE) 0.5 MG tablet Take 1 tablet (0.5 mg total) by mouth daily. 08/04/18   Ronnell Freshwater, NP  fluconazole (DIFLUCAN) 150 MG tablet Take 1 tablet po once. May repeat dose in 3 days as needed for persistent symptoms. 07/03/18   Ronnell Freshwater, NP  furosemide (LASIX) 20 MG tablet Take 1 tablet (20 mg) by mouth once daily. You may take 1 extra tablet (20 mg) after lunch as needed for leg swelling 05/11/18   Minna Merritts, MD  levofloxacin (LEVAQUIN) 500 MG tablet Take 1 tablet (500 mg total) by mouth daily. 07/03/18   Ronnell Freshwater, NP  metoprolol succinate  (TOPROL-XL) 25 MG 24 hr tablet Take 1 tablet (25 mg total) by mouth daily. Take with or immediately following a meal. 06/15/18   Gollan, Kathlene November, MD  metroNIDAZOLE (METROGEL) 0.75 % vaginal gel Place 1 Applicatorful vaginally at bedtime. 06/19/18   Ronnell Freshwater, NP  montelukast (SINGULAIR) 10 MG tablet Take 1 tablet (10 mg total) by mouth at bedtime. 04/18/18   Ronnell Freshwater, NP  naproxen sodium (ALEVE) 220 MG tablet Take 440 mg by mouth daily as needed (pain).    [provider]  nitrofurantoin, macrocrystal-monohydrate, (MACROBID) 100 MG capsule Take 1 capsule (100 mg total) by mouth 2 (two) times daily. 06/19/18   Ronnell Freshwater, NP  omeprazole (PRILOSEC) 40 MG capsule Take 1 capsule (40 mg total) by mouth daily. 06/22/18   Ronnell Freshwater, NP  phenazopyridine (PYRIDIUM) 200 MG tablet Take 1 tablet (200 mg total) by mouth 3 (three) times daily as needed for pain. 06/19/18   Ronnell Freshwater, NP  potassium chloride (K-DUR) 10 MEQ tablet Take 2 tablets (20 meq) by mouth once daily 04/10/18   Minna Merritts, MD  potassium chloride (K-DUR) 10 MEQ tablet  06/15/18   [provider]  tiZANidine (ZANAFLEX) 4 MG tablet Take 1 tablet (4 mg total) by mouth 2 (two) times daily as needed for muscle spasms. 07/04/18   Ronnell Freshwater, NP  warfarin (COUMADIN) 2.5 MG tablet TAKE 1/2 TO 1 TABLET DAILY AS DIRECTED BY COUMADIN CLINIC. 04/07/18   Minna Merritts, MD     Review of Systems    Review of Systems  Constitution: Negative for chills, fever and malaise/fatigue.  Cardiovascular: Positive for chest pain (below L breast), dyspnea on exertion, leg swelling and palpitations. Negative for irregular heartbeat and syncope.  Respiratory: Positive for shortness of breath (x3 days) and sleep disturbances due to breathing. Negative for cough and wheezing.   Gastrointestinal: Negative for nausea and vomiting.  Neurological: Positive for light-headedness. Negative for weakness.   All  other systems reviewed and are otherwise negative except as noted above.  Physical Exam    VS:  BP 120/64 (BP Location: Left Arm, Patient Position: Sitting, Cuff Size: Normal)  Pulse 61   Temp 97.9 F (36.6 C)   Ht '5\' 4"'  (1.626 m)   Wt 262 lb 4 oz (119 kg)   SpO2 97%   BMI 45.02 kg/m  , BMI Body mass index is 45.02 kg/m. GEN: Well nourished, obese, well developed, in no acute distress. HEENT: normal. Neck: Supple, no JVD, carotid bruits, or masses. Cardiac: RRR, no murmurs, rubs, or gallops. No clubbing, cyanosis.  Radials/DP/PT 2+ and equal bilaterally. Feet and ankle with 1+ pitting edema bilaterally. No edema noted on calves.  Respiratory:  Respirations regular and unlabored, clear to auscultation bilaterally, diminished in bases bilaterally due to body habitus. GI: Soft, nontender, nondistended, BS + x 4. MS: no deformity or atrophy. Skin: warm and dry, no rash. Neuro:  Strength and sensation are intact. Psych: Normal affect.  Accessory Clinical Findings    ECG personally reviewed by me today - SR rate 61 with sinus arrthymia - no acute changes.  Assessment & Plan    1.  LE edema - Increased over the last 3 weeks. She doubled her Lasix to 75m BID the last 2 weeks without improvement. Edema will resolve by morning and is mostly in her ankles, feet. Endorses trying to watch what she eats but "cheats sometimes". Legs in dependent position most of day.   Etiology venous insufficiency vs HF vs obesity. Recommend elevate lower extremities. Recommend compression stockings.  2. Dyspnea - SOB at rest and with activity x3 days. Additionally will wake at night short of breath. She denies wheeze, cough. Would benefit from following with pulmonology due to previous lung changes on CXR (ILD vs chronic bronchitis on CXR 02/2018), untreated OSA, pulmonary HTN.   Etiology HF vs mitral stenosis vs OSA (she is not on CPAP) vs deconditioning vs pulmonary (antibiotics last month for URI, note  ILD vs chronic bronchitis on CXR 02/2018).   3. HFpEF - Worsening LE edema and dyspnea over the last 3 weeks including PND, detailed above. Weight gain of 18lbs since 03/2018. Over the last 2 weeks she has doubled her Lasix to 249mBID without noted benefit. Lung sounds diminished in bases on exam due to body habitus. 1+ edema on bilateral ankles, feet. No edema noted to calves. No JVP.   BMET, ProBNP today.   Will adjust diuretic based on results - continue Lasix 209mID in the interim - do not want to adjust today as unknown renal function on already increased dose. Consider addition of Spironolactone as her edema is primarily feet/ankles.   Repeat echocardiogram assess for worsening HF/mitral stenosis.   4. CAD - Mild nonobstructive CAD by cardiac CTA 02/2018. GDMT statin, beta blocker - no aspirin in the setting of Warfarin. Few day history of intermittent "stabbing" pain below her left breast. Occurs at rest. Not associated with SOB nor diaphoresis. Atypical for anginal pain. Etiology likely volume overload versus musculoskeletal vs anxiety. EKG today without ST/T wave changes. Ischemic evaluation not indicated at this time.   5. PAF - EKG with sinus rhythm, sinus arrthymia today. No evidence of recurrence. Anticoagulated on Warfarin. Continue Cardizem and Metoprolol.  6. Palpitations - Report frequency is unchanged since her last office visit. Still notices intermittently both at rest and with activity. Not limiting her lifestyle. Magnesium and TSH today.  7. Chronic anticoagulation - Warfarin in the setting of valvular atrial fib. [facility]. 07/31/18 INR 2.8. CBC today.  8. Moderate mitral stenosis on echo 08/2017 - New onset dyspnea x3 days as above.  Reports some lightheadedness and being "off balance" for a few weeks. Episodes of chest pain below left breast, as above. Repeat echocardiogram for reassessment of MV.   Disposition: Labs today. Diuretic adjustment as needed  based on labs tomorrow. Echocardiogram. Follow up in 4 weeks with APP or Dr. Rockey Situ.  Loel Dubonnet, NP 08/15/2018, 3:03 PM

## 2018-08-15 NOTE — Telephone Encounter (Signed)
Spoke with the pt. Pt sts that she has had a wight gain of 12lbs in the last 3-4 weeks. She has increased her lasix to 40mg  daily but she has not had any improvement in her LE swelling and has not had any weight loss. Pt denies worsening sob, orthopnea, PND. Pt denies cough, fever, chills n/v. Pt endorses increased LE edema over the last few weeks. Pt sts that that a couple of days ago she started to have intermittent chest pain. Nothing makes that CP better or worse. It occurs with activity or at rest, not associated with any other symptoms. Pt has not had any CP today.  Adv the pt that I would recommend that she be seen for evaluation. Pt agreeable appt scheduled for today @ 2pm with Christell Faith, PA. Pt aware of the appt time. Adv her arrive 45min prior to the appt, enter through the medical mall, and to wear a face mask.  COVID screening done       COVID-19 Pre-Screening Questions:  . In the past 7 to 10 days have you had a cough,  shortness of breath, headache, congestion, fever (100 or greater) body aches, chills, sore throat, or sudden loss of taste or sense of smell? no . Have you been around anyone with known Covid 19. No . Have you been around anyone who is awaiting Covid 19 test results in the past 7 to 10 days? No . Have you been around anyone who has been exposed to Covid 19, or has mentioned symptoms of Covid 19 within the past 7 to 10 days? No

## 2018-08-15 NOTE — Patient Instructions (Signed)
Medication Instructions:  Your physician recommends that you continue on your current medications as directed. Please refer to the Current Medication list given to you today.  If you need a refill on your cardiac medications before your next appointment, please call your pharmacy.   Lab work: Your physician recommends that you have lab work today(BMET, BNP, CBC, TSH, Mag)  If you have labs (blood work) drawn today and your tests are completely normal, you will receive your results only by: Marland Kitchen MyChart Message (if you have MyChart) OR . A paper copy in the mail If you have any lab test that is abnormal or we need to change your treatment, we will call you to review the results.  Testing/Procedures: 1- Echo  Please return to [facility] on ______________ at _______________ AM/PM for an Echocardiogram. Your physician has requested that you have an echocardiogram. Echocardiography is a painless test that uses sound waves to create images of your heart. It provides your doctor with information about the size and shape of your heart and how well your heart's chambers and valves are working. This procedure takes approximately one hour. There are no restrictions for this procedure. Please note; depending on visual quality an IV may need to be placed.    Follow-Up: At [facility], you and your health needs are our priority.  As part of our continuing mission to provide you with exceptional heart care, we have created designated Provider Care Teams.  These Care Teams include your primary Cardiologist (physician) and Advanced Practice Providers (APPs -  Physician Assistants and Nurse Practitioners) who all work together to provide you with the care you need, when you need it. You will need a follow up appointment in 4 weeks.  You may see Ida Rogue, MD or Christell Faith, PA-C.

## 2018-08-15 NOTE — Telephone Encounter (Signed)
Pt c/o swelling: STAT is pt has developed SOB within 24 hours  How much weight have you gained and in what time span?  12 pounds in the last month  If swelling, where is the swelling located? Bilateral feet and ankles  1) Are you currently taking a fluid pill? yes  2) Are you currently SOB? A little bit  3) Do you have a log of your daily weights (if so, list)? Started out at 207 , now 219  4) Have you gained 3 pounds in a day or 5 pounds in a week? yes  5) Have you traveled recently? No  Patient states she has experienced CP also  Pt c/o of Chest Pain: STAT if CP now or developed within 24 hours  1. Are you having CP right now?  no 2. Are you experiencing any other symptoms (ex. SOB, nausea, vomiting, sweating)? Swelling in bilateral feet and ankles, some sweating and nausea  3. How long have you been experiencing CP? Couple days  4. Is your CP continuous or coming and going? Comes and goes  5. Have you taken Nitroglycerin?  ?

## 2018-08-16 ENCOUNTER — Telehealth: Payer: Self-pay

## 2018-08-16 DIAGNOSIS — I5033 Acute on chronic diastolic (congestive) heart failure: Secondary | ICD-10-CM

## 2018-08-16 NOTE — Telephone Encounter (Signed)
-----   Message from Loel Dubonnet, NP sent at 08/16/2018  8:14 AM EDT ----- Please call patient with results and the following recommendations.  Electrolytes (potassium, magnesium normal). Thyroid function normal. Kidney function showed a marginal decrease. Pro BNP (which tells [place] fluid status) is mildly elevated, but stable compared to last time. Because she was symptomatic with SOB, LE edema will plan to adjust Lasix. Increase Lasix to 40mg  (2 tablets) in the morning and 20mg  (1 tablet) in the afternoon.   Recommend compression stockings, elevating her lower extremities when sitting, and continuing to avoid salt.   Of note, her hemoglobin seems to be somewhat downward trending over the last few months which could be indicative of anemia. Would recommend she follow up with her PCP.   Recheck BMP in 1 week to look at kidney function and potassium.

## 2018-08-16 NOTE — Telephone Encounter (Signed)
Pt made aware of lab results and Loel Dubonnet, NP recommendation. Order in [place] for repeat 1 week bmet,. Pt will have lab drawn at the medical mall. Results fwd to the pt pcp, pt adv to f/u with her.

## 2018-08-17 ENCOUNTER — Other Ambulatory Visit
Admission: RE | Admit: 2018-08-17 | Discharge: 2018-08-17 | Disposition: A | Discharge status: Home / Self Care | Payer: Managed Care, Other (non HMO) | Source: Ambulatory Visit | Attending: Urology | Admitting: Urology

## 2018-08-17 ENCOUNTER — Other Ambulatory Visit: Payer: Self-pay

## 2018-08-17 ENCOUNTER — Other Ambulatory Visit: Payer: Managed Care, Other (non HMO)

## 2018-08-17 ENCOUNTER — Telehealth (INDEPENDENT_AMBULATORY_CARE_PROVIDER_SITE_OTHER): Payer: Managed Care, Other (non HMO) | Admitting: Urology

## 2018-08-17 ENCOUNTER — Other Ambulatory Visit: Payer: Self-pay | Admitting: Urology

## 2018-08-17 DIAGNOSIS — Z20828 Contact with and (suspected) exposure to other viral communicable diseases: Secondary | ICD-10-CM | POA: Diagnosis not present

## 2018-08-17 DIAGNOSIS — N200 Calculus of kidney: Secondary | ICD-10-CM

## 2018-08-17 LAB — MICROSCOPIC EXAMINATION: RBC, Urine: NONE SEEN /hpf (ref 0–2)

## 2018-08-17 LAB — URINALYSIS, COMPLETE
Bilirubin, UA: NEGATIVE
Glucose, UA: NEGATIVE
Ketones, UA: NEGATIVE
Leukocytes,UA: NEGATIVE
Nitrite, UA: NEGATIVE
Protein,UA: NEGATIVE
RBC, UA: NEGATIVE
Specific Gravity, UA: 1.015 (ref 1.005–1.030)
Urobilinogen, Ur: 0.2 mg/dL (ref 0.2–1.0)
pH, UA: 6.5 (ref 5.0–7.5)

## 2018-08-17 NOTE — Progress Notes (Signed)
Virtual Visit via Telephone Note  I connected with Flowella on 08/17/18 at  8:30 AM EDT by telephone and verified that I am speaking with the correct person using two identifiers.   I discussed the limitations, risks, security and privacy concerns of performing an evaluation and management service by telephone and the availability of in person appointments. We discussed the impact of the COVID-19 pandemic on the healthcare system, and the importance of social distancing and reducing patient and provider exposure. I also discussed with the patient that there may be a patient responsible charge related to this service. The patient expressed understanding and agreed to proceed.  Reason for visit: Flank pain  History of Present Illness: I connected with Mr. Springfield via telephone today to discuss nephrolithiasis.  She is a 53 year old comorbid female who I originally saw on 07/18/2018 for bilateral flank pain, worse on the right side.  She ultimately underwent a CT scan on 07/18/2018 that showed no ureteral stones or hydronephrosis bilaterally.  However, on the right side there was a 1 cm stone that appeared to be either intraparenchymal adjacent to a cyst, or obstructing an upper pole calyx.  This was read by radiology as an intraparenchymal calcification and cyst, however on my review I feel it could be a stone obstructing an upper pole calyx in the setting of her symptoms.  She notes ongoing intermittent right-sided flank pain that can radiate to the groin.  She does think it feels similar to her prior stone event.  He denies any fevers or chills.  Assessment and Plan: We do long conversation about her CT findings today.  Options would include ongoing observation, versus intervention with ureteroscopy, possible laser lithotripsy, possible stent placement if she is found to have an obstructing stone in an upper calyceal system.  We discussed the risks and benefits of surgery at length.   We also discussed that her ureteroscopy could be negative, and this may not improve her pain on that side, which would indicate a non-urologic cause of her pain.  In the setting of her ongoing pain on the right side without alternative explanation, she would like to pursue right ureteroscopy, laser lithotripsy, and stent placement.  Follow Up: We will schedule right ureteroscopy, laser lithotripsy, stent placement   I discussed the assessment and treatment plan with the patient. The patient was provided an opportunity to ask questions and all were answered. The patient agreed with the plan and demonstrated an understanding of the instructions.   The patient was advised to call back or seek an in-person evaluation if the symptoms worsen or if the condition fails to improve as anticipated.  I provided 15 minutes of non-face-to-face time during this encounter.   Billey Co, MD

## 2018-08-18 ENCOUNTER — Encounter: Payer: Self-pay | Admitting: *Deleted

## 2018-08-18 ENCOUNTER — Encounter
Admission: RE | Disposition: A | Discharge status: Home / Self Care | Payer: Self-pay | Source: Home / Self Care | Attending: Urology

## 2018-08-18 ENCOUNTER — Telehealth: Payer: Self-pay | Admitting: Urology

## 2018-08-18 ENCOUNTER — Ambulatory Visit: Payer: Managed Care, Other (non HMO) | Admitting: Certified Registered Nurse Anesthetist

## 2018-08-18 ENCOUNTER — Ambulatory Visit
Admission: RE | Admit: 2018-08-18 | Discharge: 2018-08-18 | Disposition: A | Discharge status: Home / Self Care | Payer: Managed Care, Other (non HMO) | Attending: Urology | Admitting: Urology

## 2018-08-18 DIAGNOSIS — N200 Calculus of kidney: Secondary | ICD-10-CM | POA: Insufficient documentation

## 2018-08-18 DIAGNOSIS — I2510 Atherosclerotic heart disease of native coronary artery without angina pectoris: Secondary | ICD-10-CM | POA: Insufficient documentation

## 2018-08-18 DIAGNOSIS — Z87891 Personal history of nicotine dependence: Secondary | ICD-10-CM | POA: Diagnosis not present

## 2018-08-18 DIAGNOSIS — I110 Hypertensive heart disease with heart failure: Secondary | ICD-10-CM | POA: Insufficient documentation

## 2018-08-18 DIAGNOSIS — N281 Cyst of kidney, acquired: Secondary | ICD-10-CM | POA: Insufficient documentation

## 2018-08-18 HISTORY — PX: CYSTOSCOPY W/ RETROGRADES: SHX1426

## 2018-08-18 HISTORY — PX: CYSTOSCOPY/URETEROSCOPY/HOLMIUM LASER/STENT PLACEMENT: SHX6546

## 2018-08-18 LAB — SARS CORONAVIRUS 2 (TAT 6-24 HRS): SARS Coronavirus 2: NEGATIVE

## 2018-08-18 SURGERY — CYSTOSCOPY/URETEROSCOPY/HOLMIUM LASER/STENT PLACEMENT
Anesthesia: General | Laterality: Right

## 2018-08-18 MED ORDER — MIDAZOLAM HCL 2 MG/2ML IJ SOLN
INTRAMUSCULAR | Status: DC | PRN
  Administered 2018-08-18: 1 mg via INTRAVENOUS

## 2018-08-18 MED ORDER — KETOROLAC TROMETHAMINE 30 MG/ML IJ SOLN
INTRAMUSCULAR | Status: AC
  Administered 2018-08-18: 15 mg via INTRAVENOUS
  Filled 2018-08-18: qty 1

## 2018-08-18 MED ORDER — ROCURONIUM BROMIDE 50 MG/5ML IV SOLN
INTRAVENOUS | Status: AC
  Filled 2018-08-18: qty 1

## 2018-08-18 MED ORDER — SULFAMETHOXAZOLE-TRIMETHOPRIM 800-160 MG PO TABS: 1.0000 | ORAL_TABLET | Freq: Every day | ORAL | 0 refills | Status: DC

## 2018-08-18 MED ORDER — FENTANYL CITRATE (PF) 100 MCG/2ML IJ SOLN
INTRAMUSCULAR | Status: AC
  Filled 2018-08-18: qty 2

## 2018-08-18 MED ORDER — ONDANSETRON HCL 4 MG/2ML IJ SOLN
4.0000 mg | Freq: Once | INTRAMUSCULAR | Status: AC | PRN
  Administered 2018-08-18: 4 mg via INTRAVENOUS

## 2018-08-18 MED ORDER — KETOROLAC TROMETHAMINE 30 MG/ML IJ SOLN
15.0000 mg | Freq: Once | INTRAMUSCULAR | Status: AC
  Administered 2018-08-18: 15 mg via INTRAVENOUS

## 2018-08-18 MED ORDER — SUCCINYLCHOLINE CHLORIDE 20 MG/ML IJ SOLN
INTRAMUSCULAR | Status: AC
  Filled 2018-08-18: qty 1

## 2018-08-18 MED ORDER — DEXAMETHASONE SODIUM PHOSPHATE 10 MG/ML IJ SOLN
INTRAMUSCULAR | Status: DC | PRN
  Administered 2018-08-18: 5 mg via INTRAVENOUS

## 2018-08-18 MED ORDER — CIPROFLOXACIN IN D5W 400 MG/200ML IV SOLN
INTRAVENOUS | Status: DC | PRN
  Administered 2018-08-18: 400 mg via INTRAVENOUS

## 2018-08-18 MED ORDER — SUCCINYLCHOLINE CHLORIDE 20 MG/ML IJ SOLN
INTRAMUSCULAR | Status: DC | PRN
  Administered 2018-08-18: 100 mg via INTRAVENOUS

## 2018-08-18 MED ORDER — SUGAMMADEX SODIUM 200 MG/2ML IV SOLN
INTRAVENOUS | Status: DC | PRN
  Administered 2018-08-18: 500 mg via INTRAVENOUS

## 2018-08-18 MED ORDER — PHENYLEPHRINE HCL (PRESSORS) 10 MG/ML IV SOLN
INTRAVENOUS | Status: AC
  Filled 2018-08-18: qty 1

## 2018-08-18 MED ORDER — DEXAMETHASONE SODIUM PHOSPHATE 10 MG/ML IJ SOLN
INTRAMUSCULAR | Status: AC
  Filled 2018-08-18: qty 1

## 2018-08-18 MED ORDER — IOHEXOL 180 MG/ML  SOLN
INTRAMUSCULAR | Status: DC | PRN
  Administered 2018-08-18: 15 mL

## 2018-08-18 MED ORDER — MIDAZOLAM HCL 2 MG/2ML IJ SOLN
INTRAMUSCULAR | Status: AC
  Filled 2018-08-18: qty 2

## 2018-08-18 MED ORDER — FENTANYL CITRATE (PF) 100 MCG/2ML IJ SOLN
25.0000 ug | INTRAMUSCULAR | Status: DC | PRN
  Administered 2018-08-18 (×4): 25 ug via INTRAVENOUS

## 2018-08-18 MED ORDER — KETOROLAC TROMETHAMINE 30 MG/ML IJ SOLN
INTRAMUSCULAR | Status: DC | PRN
  Administered 2018-08-18: 15 mg via INTRAVENOUS

## 2018-08-18 MED ORDER — ONDANSETRON HCL 4 MG/2ML IJ SOLN
INTRAMUSCULAR | Status: AC
  Filled 2018-08-18: qty 2

## 2018-08-18 MED ORDER — SODIUM CHLORIDE 0.9 % IV SOLN
INTRAVENOUS | Status: DC | PRN
  Administered 2018-08-18: 20 ug/min via INTRAVENOUS

## 2018-08-18 MED ORDER — SUGAMMADEX SODIUM 500 MG/5ML IV SOLN
INTRAVENOUS | Status: AC
  Filled 2018-08-18: qty 5

## 2018-08-18 MED ORDER — FENTANYL CITRATE (PF) 100 MCG/2ML IJ SOLN
INTRAMUSCULAR | Status: AC
  Administered 2018-08-18: 25 ug via INTRAVENOUS
  Filled 2018-08-18: qty 2

## 2018-08-18 MED ORDER — FENTANYL CITRATE (PF) 100 MCG/2ML IJ SOLN: 25.0000 ug | INTRAMUSCULAR | Status: DC | PRN

## 2018-08-18 MED ORDER — LIDOCAINE HCL (CARDIAC) PF 100 MG/5ML IV SOSY
PREFILLED_SYRINGE | INTRAVENOUS | Status: DC | PRN
  Administered 2018-08-18: 80 mg via INTRAVENOUS

## 2018-08-18 MED ORDER — PROPOFOL 10 MG/ML IV BOLUS
INTRAVENOUS | Status: DC | PRN
  Administered 2018-08-18: 40 mg via INTRAVENOUS

## 2018-08-18 MED ORDER — ONDANSETRON HCL 4 MG/2ML IJ SOLN
INTRAMUSCULAR | Status: DC | PRN
  Administered 2018-08-18: 4 mg via INTRAVENOUS

## 2018-08-18 MED ORDER — PHENYLEPHRINE HCL (PRESSORS) 10 MG/ML IV SOLN
INTRAVENOUS | Status: DC | PRN
  Administered 2018-08-18: 300 ug via INTRAVENOUS
  Administered 2018-08-18: 200 ug via INTRAVENOUS
  Administered 2018-08-18: 100 ug via INTRAVENOUS

## 2018-08-18 MED ORDER — FENTANYL CITRATE (PF) 100 MCG/2ML IJ SOLN
INTRAMUSCULAR | Status: DC | PRN
  Administered 2018-08-18: 50 ug via INTRAVENOUS

## 2018-08-18 MED ORDER — ROCURONIUM BROMIDE 100 MG/10ML IV SOLN
INTRAVENOUS | Status: DC | PRN
  Administered 2018-08-18: 20 mg via INTRAVENOUS
  Administered 2018-08-18: 30 mg via INTRAVENOUS

## 2018-08-18 MED ORDER — PROPOFOL 10 MG/ML IV BOLUS
INTRAVENOUS | Status: AC
  Filled 2018-08-18: qty 20

## 2018-08-18 MED ORDER — HYDROCODONE-ACETAMINOPHEN 5-325 MG PO TABS: 1.0000 | ORAL_TABLET | ORAL | 0 refills | Status: AC | PRN

## 2018-08-18 MED ORDER — CIPROFLOXACIN IN D5W 400 MG/200ML IV SOLN
INTRAVENOUS | Status: AC
  Filled 2018-08-18: qty 200

## 2018-08-18 MED ORDER — LACTATED RINGERS IV SOLN
INTRAVENOUS | Status: DC
  Administered 2018-08-18: 09:00:00 via INTRAVENOUS

## 2018-08-18 SURGICAL SUPPLY — 31 items
BAG DRAIN CYSTO-URO LG1000N (MISCELLANEOUS) ×2 IMPLANT
BRUSH SCRUB EZ 1% IODOPHOR (MISCELLANEOUS) ×2 IMPLANT
CATH URETL 5X70 OPEN END (CATHETERS) IMPLANT
CNTNR SPEC 2.5X3XGRAD LEK (MISCELLANEOUS)
CONT SPEC 4OZ STER OR WHT (MISCELLANEOUS)
CONTAINER SPEC 2.5X3XGRAD LEK (MISCELLANEOUS) IMPLANT
DRAPE UTILITY 15X26 TOWEL STRL (DRAPES) ×2 IMPLANT
DRSG TEGADERM 2-3/8X2-3/4 SM (GAUZE/BANDAGES/DRESSINGS) ×2 IMPLANT
FIBER LASER LITHO 273 (Laser) IMPLANT
GLOVE BIOGEL PI IND STRL 7.5 (GLOVE) ×1 IMPLANT
GLOVE BIOGEL PI INDICATOR 7.5 (GLOVE) ×1
GOWN STRL REUS W/ TWL LRG LVL3 (GOWN DISPOSABLE) ×1 IMPLANT
GOWN STRL REUS W/ TWL XL LVL3 (GOWN DISPOSABLE) ×1 IMPLANT
GOWN STRL REUS W/TWL LRG LVL3 (GOWN DISPOSABLE) ×1
GOWN STRL REUS W/TWL XL LVL3 (GOWN DISPOSABLE) ×1
GUIDEWIRE SENSOR ANG DUAL FLEX (WIRE) ×2 IMPLANT
GUIDEWIRE STR DUAL SENSOR (WIRE) ×2 IMPLANT
INFUSOR MANOMETER BAG 3000ML (MISCELLANEOUS) ×2 IMPLANT
INTRODUCER DILATOR DOUBLE (INTRODUCER) ×2 IMPLANT
KIT TURNOVER CYSTO (KITS) ×2 IMPLANT
PACK CYSTO AR (MISCELLANEOUS) ×2 IMPLANT
SET CYSTO W/LG BORE CLAMP LF (SET/KITS/TRAYS/PACK) ×2 IMPLANT
SHEATH URETERAL 12FRX35CM (MISCELLANEOUS) IMPLANT
SOL .9 NS 3000ML IRR  AL (IV SOLUTION) ×1
SOL .9 NS 3000ML IRR UROMATIC (IV SOLUTION) ×1 IMPLANT
STENT URET 6FRX24 CONTOUR (STENTS) IMPLANT
STENT URET 6FRX26 CONTOUR (STENTS) ×2 IMPLANT
SURGILUBE 2OZ TUBE FLIPTOP (MISCELLANEOUS) ×2 IMPLANT
SYR 10ML LL (SYRINGE) ×2 IMPLANT
VALVE UROSEAL ADJ ENDO (VALVE) ×2 IMPLANT
WATER STERILE IRR 1000ML POUR (IV SOLUTION) ×2 IMPLANT

## 2018-08-18 NOTE — H&P (Signed)
UROLOGY H&P UPDATE  Agree with prior H&P dated 07/18/2018.  53 year old female with ongoing right-sided flank pain and CT demonstrating a possible 1 cm stone and obstructed upper pole calyx.  We reviewed options at length including observation versus ureteroscopy, laser lithotripsy, and stent placement.  She has tried a month of observation and continues to have right-sided pain and elected to pursue surgery.  Cardiac: Irregularly irregular Lungs: CTA bilaterally  Laterality: Right Procedure: Right ureteroscopy, laser lithotripsy, stent placement  Urine: Urinalysis 7/30 few bacteria, 0-5 WBCs, 0 RBCs, 0-10 epithelial cells, nitrite negative, no leukocytes  Informed consent obtained, we specifically discussed the risks of bleeding, infection, post-operative pain, need for additional procedures, possible parenchymal stone, possible refractory pain despite treatment of the stone, stent related symptoms including flank pain, dysuria, urgency, and frequency, and need for stent removal in the future.  Billey Co, MD 08/18/2018

## 2018-08-18 NOTE — Anesthesia Procedure Notes (Signed)
Procedure Name: Intubation Date/Time: 08/18/2018 9:04 AM Performed by: Johnna Acosta, CRNA Pre-anesthesia Checklist: Patient identified, Emergency Drugs available, Suction available, Patient being monitored and Timeout performed Patient Re-evaluated:Patient Re-evaluated prior to induction Oxygen Delivery Method: Circle system utilized Preoxygenation: Pre-oxygenation with 100% oxygen Induction Type: IV induction Ventilation: Mask ventilation without difficulty Laryngoscope Size: Miller and 2 Grade View: Grade III Tube type: Oral Tube size: 7.0 mm Number of attempts: 1 Airway Equipment and Method: Stylet Placement Confirmation: ETT inserted through vocal cords under direct vision,  positive ETCO2 and breath sounds checked- equal and bilateral Secured at: 21.5 cm Tube secured with: Tape Dental Injury: Teeth and Oropharynx as per pre-operative assessment  Difficulty Due To: Difficulty was unanticipated, Difficult Airway- due to large tongue and Difficult Airway- due to limited oral opening Future Recommendations: Recommend- induction with short-acting agent, and alternative techniques readily available

## 2018-08-18 NOTE — Progress Notes (Signed)
Dr. Kayleen Memos at bedside to evaluate pt. , pain right shoulder / arm pain easing.

## 2018-08-18 NOTE — Anesthesia Postprocedure Evaluation (Signed)
Anesthesia Post Note  Patient: Leslie [name], Leslie [name]  Procedure(s) Performed: CYSTOSCOPY/URETEROSCOPY/STENT PLACEMENT (Right ) CYSTOSCOPY WITH RETROGRADE PYELOGRAM (Right )  Patient location during evaluation: PACU Anesthesia Type: General Level of consciousness: awake and alert and oriented Pain management: pain level controlled Vital Signs Assessment: post-procedure vital signs reviewed and stable Respiratory status: spontaneous breathing Cardiovascular status: blood pressure returned to baseline Anesthetic complications: no     Last Vitals:  Vitals:   08/18/18 1050 08/18/18 1106  BP: 107/68 111/66  Pulse: (!) 55 (!) 56  Resp: 12 12  Temp:    SpO2: 95% 95%    Last Pain:  Vitals:   08/18/18 1106  TempSrc:   PainSc: 5                  Leslie [name]

## 2018-08-18 NOTE — Telephone Encounter (Signed)
-----   Message from Royanne Foots, [place] sent at 08/18/2018 11:45 AM EDT ----- Regarding: FW: follow up  ----- Message ----- From: Billey Co, MD Sent: 08/18/2018  11:43 AM EDT To: Rowe Robert Clinical Subject: follow up                                      Please schedule follow up in clinic in 6 months with me  Nickolas Madrid, MD 08/18/2018

## 2018-08-18 NOTE — Telephone Encounter (Signed)
App made pt is aware 

## 2018-08-18 NOTE — Transfer of Care (Signed)
Immediate Anesthesia Transfer of Care Note  Patient: Leslie [name]  Procedure(s) Performed: CYSTOSCOPY/URETEROSCOPY/STENT PLACEMENT (Right ) CYSTOSCOPY WITH RETROGRADE PYELOGRAM (Right )  Patient Location: PACU  Anesthesia Type:General  Level of Consciousness: awake, alert  and oriented  Airway & Oxygen Therapy: Patient Spontanous Breathing and Patient connected to face mask oxygen  Post-op Assessment: Report given to RN, Post -op Vital signs reviewed and stable and Patient moving all extremities X 4  Post vital signs: Reviewed and stable  Last Vitals:  Vitals Value Taken Time  BP 141/67 08/18/18 1005  Temp 36.7 C 08/18/18 1005  Pulse 85 08/18/18 1010  Resp 20 08/18/18 1010  SpO2 93 % 08/18/18 1010  Vitals shown include unvalidated device data.  Last Pain:  Vitals:   08/18/18 0812  TempSrc: Tympanic  PainSc: 2          Complications: No apparent anesthesia complications

## 2018-08-18 NOTE — Anesthesia Preprocedure Evaluation (Signed)
Anesthesia Evaluation  Patient identified by MRN, date of birth, ID band Patient awake    Reviewed: Allergy & Precautions, NPO status , Patient's Chart, lab work & pertinent test results  Airway Mallampati: III       Dental   Pulmonary pneumonia, resolved, former smoker,    Pulmonary exam normal        Cardiovascular hypertension, + CAD and +CHF  Normal cardiovascular exam     Neuro/Psych PSYCHIATRIC DISORDERS Anxiety Depression    GI/Hepatic GERD  ,  Endo/Other    Renal/GU Renal disease     Musculoskeletal  (+) Arthritis ,   Abdominal Normal abdominal exam  (+)   Peds negative pediatric ROS (+)  Hematology  (+) anemia ,   Anesthesia Other Findings   Reproductive/Obstetrics                             Anesthesia Physical Anesthesia Plan  ASA: III  Anesthesia Plan: General   Post-op Pain Management:    Induction: Intravenous  PONV Risk Score and Plan:   Airway Management Planned: Oral ETT  Additional Equipment:   Intra-op Plan:   Post-operative Plan: Extubation in OR  Informed Consent: I have reviewed the patients History and Physical, chart, labs and discussed the procedure including the risks, benefits and alternatives for the proposed anesthesia with the patient or authorized representative who has indicated his/her understanding and acceptance.     Dental advisory given  Plan Discussed with: CRNA  Anesthesia Plan Comments:         Anesthesia Quick Evaluation

## 2018-08-18 NOTE — Discharge Instructions (Signed)

## 2018-08-18 NOTE — Op Note (Signed)
Date of procedure: 08/18/18  Preoperative diagnosis:  1. Right renal stone  Postoperative diagnosis:  1. Right renal parenchymal stone, no obstruction  Procedure: 1. Cystoscopy, right diagnostic ureteroscopy, right retrograde pyelogram with intraoperative interpretation, right ureteral stent placement on Dangler  Surgeon: Nickolas Madrid, MD  Anesthesia: General  Complications: None  Intraoperative findings:  1.  Normal cystoscopy 2.  Thorough right pyeloscopy revealed no stone or obstructed calyx 3.  Retrograde pyelogram with no filling defects or extravasation, stone appeared to be intraparenchymal 4.  Uncomplicated right ureteral stent placement on Dangler  EBL: Minimal  Specimens: None  Drains: Right 6 French by 26 cm ureteral stent on Dangler  Indication: Leslie [name] is a 53 y.o. patient with ongoing right-sided flank pain and CT demonstrating a 1 cm right renal stone that was either intraparenchymal adjacent to a renal cyst, or was obstructing an upper pole calyceal system.  After reviewing the management options for treatment, they elected to proceed with the above surgical procedure(s). We have discussed the potential benefits and risks of the procedure, side effects of the proposed treatment, the likelihood of the patient achieving the goals of the procedure, and any potential problems that might occur during the procedure or recuperation. Informed consent has been obtained.  Description of procedure:  The patient was taken to the operating room and general anesthesia was induced. SCDs were placed for DVT prophylaxis. The patient was placed in the dorsal lithotomy position, prepped and draped in the usual sterile fashion, and preoperative antibiotics(Cipro) were administered. A preoperative time-out was performed.   A 21 French rigid cystoscope was used to intubate the urethra.  Thorough cystoscopy was performed and the bladder was grossly normal.  A sensor  wire was advanced up the right ureteral orifice under fluoroscopic vision up to the kidney.  The 1 cm opacification could clearly be seen on fluoroscopy medially over the renal shadow.  The ureter appeared quite tight and I elected to perform dilation using a dual-lumen access catheter.  A single-channel flexible ureteroscope then advanced easily over the wire up into the collecting system.  Thorough pyeloscopy was performed and revealed no nephrolithiasis or narrow neck of a calyceal system.  All areas of the kidney were thoroughly explored.  A retrograde pyelogram was performed through the scope and contrast did not appear to fill the area adjacent to the stone.  The outline of the collecting system was normal with no hydronephrosis, filling defects, or extravasation.  The stone appeared to be intraparenchymal.  The sensor wire was replaced through the scope, and careful pullback ureteroscopy demonstrated a widely patent ureter with no injuries.  A 6 French by 26 cm stent was placed on a Dangler with an excellent curl in the renal pelvis, as well as under direct vision in the bladder.  The Dangler was secured to the patient's right inner thigh.  Disposition: Stable to PACU  Plan: Bactrim prophylaxis while stent in place Remove stent at home on Monday morning Follow-up in clinic in 3 months for symptom check  Nickolas Madrid, MD

## 2018-08-18 NOTE — Progress Notes (Signed)
Pt. C/O right shoulder and arm pain , Dr. Kayleen Memos and Dr. Chrystine Oiler notified .Toradol ordered by Dr. Kayleen Memos see MAR .

## 2018-08-18 NOTE — Anesthesia Post-op Follow-up Note (Signed)
Anesthesia QCDR form completed.        

## 2018-08-19 LAB — CULTURE, URINE COMPREHENSIVE

## 2018-08-21 ENCOUNTER — Other Ambulatory Visit: Payer: Self-pay | Admitting: Internal Medicine

## 2018-08-21 ENCOUNTER — Telehealth: Payer: Self-pay | Admitting: Urology

## 2018-08-21 NOTE — Telephone Encounter (Signed)
Pt had surgery last Friday w/Sninsky.  She wants to know if she can get more pain meds.  She is still having back pain post surgery.

## 2018-08-22 ENCOUNTER — Encounter: Payer: Self-pay | Admitting: Radiology

## 2018-08-22 NOTE — Telephone Encounter (Signed)
It looks like she was post remove her stent yesterday.  I would assume today she is feeling much better.  Please follow-up with her and see how she is doing.  I would strongly recommend Motrin.  Hollice Espy, MD

## 2018-08-22 NOTE — Telephone Encounter (Signed)
Pt's wife is still in a lot of pain post surgery.  Pt can hardly walk.

## 2018-08-22 NOTE — Telephone Encounter (Signed)
Spoke with patient she states she is a 7/10 in pain after stent removal. She states it is a throbbing pain and she is taking alieve 2 tablets of 200mg , she was instructed she could take 800mg . She was in so much pain she could not go back to work today.

## 2018-08-22 NOTE — Telephone Encounter (Signed)
Will have Dr Diamantina Providence sign paperwork when he returns to the office and fax it to her employer.

## 2018-08-23 ENCOUNTER — Encounter: Payer: Self-pay | Admitting: Urology

## 2018-08-26 ENCOUNTER — Emergency Department: Payer: Managed Care, Other (non HMO)

## 2018-08-26 ENCOUNTER — Inpatient Hospital Stay: Payer: Managed Care, Other (non HMO)

## 2018-08-26 ENCOUNTER — Encounter: Payer: Self-pay | Admitting: Emergency Medicine

## 2018-08-26 ENCOUNTER — Other Ambulatory Visit: Payer: Self-pay | Admitting: Cardiovascular Disease

## 2018-08-26 ENCOUNTER — Other Ambulatory Visit: Payer: Self-pay

## 2018-08-26 ENCOUNTER — Inpatient Hospital Stay
Admission: EM | Admit: 2018-08-26 | Discharge: 2018-09-01 | DRG: 871 | Disposition: A | Discharge status: Home Health | Payer: Managed Care, Other (non HMO) | Attending: Internal Medicine | Admitting: Internal Medicine

## 2018-08-26 DIAGNOSIS — D638 Anemia in other chronic diseases classified elsewhere: Secondary | ICD-10-CM | POA: Diagnosis present

## 2018-08-26 DIAGNOSIS — J9601 Acute respiratory failure with hypoxia: Secondary | ICD-10-CM | POA: Diagnosis not present

## 2018-08-26 DIAGNOSIS — R791 Abnormal coagulation profile: Secondary | ICD-10-CM | POA: Diagnosis present

## 2018-08-26 DIAGNOSIS — E785 Hyperlipidemia, unspecified: Secondary | ICD-10-CM | POA: Diagnosis present

## 2018-08-26 DIAGNOSIS — N2881 Hypertrophy of kidney: Secondary | ICD-10-CM | POA: Diagnosis present

## 2018-08-26 DIAGNOSIS — I4819 Other persistent atrial fibrillation: Secondary | ICD-10-CM | POA: Diagnosis present

## 2018-08-26 DIAGNOSIS — Z87891 Personal history of nicotine dependence: Secondary | ICD-10-CM

## 2018-08-26 DIAGNOSIS — I5033 Acute on chronic diastolic (congestive) heart failure: Secondary | ICD-10-CM | POA: Diagnosis not present

## 2018-08-26 DIAGNOSIS — I130 Hypertensive heart and chronic kidney disease with heart failure and stage 1 through stage 4 chronic kidney disease, or unspecified chronic kidney disease: Secondary | ICD-10-CM | POA: Diagnosis present

## 2018-08-26 DIAGNOSIS — Z79899 Other long term (current) drug therapy: Secondary | ICD-10-CM

## 2018-08-26 DIAGNOSIS — M79671 Pain in right foot: Secondary | ICD-10-CM | POA: Diagnosis not present

## 2018-08-26 DIAGNOSIS — N12 Tubulo-interstitial nephritis, not specified as acute or chronic: Secondary | ICD-10-CM

## 2018-08-26 DIAGNOSIS — I480 Paroxysmal atrial fibrillation: Secondary | ICD-10-CM | POA: Diagnosis not present

## 2018-08-26 DIAGNOSIS — K219 Gastro-esophageal reflux disease without esophagitis: Secondary | ICD-10-CM | POA: Diagnosis present

## 2018-08-26 DIAGNOSIS — A419 Sepsis, unspecified organism: Principal | ICD-10-CM | POA: Diagnosis present

## 2018-08-26 DIAGNOSIS — N10 Acute tubulo-interstitial nephritis: Secondary | ICD-10-CM | POA: Diagnosis present

## 2018-08-26 DIAGNOSIS — Z6841 Body Mass Index (BMI) 40.0 and over, adult: Secondary | ICD-10-CM | POA: Diagnosis not present

## 2018-08-26 DIAGNOSIS — Z20828 Contact with and (suspected) exposure to other viral communicable diseases: Secondary | ICD-10-CM | POA: Diagnosis present

## 2018-08-26 DIAGNOSIS — I2510 Atherosclerotic heart disease of native coronary artery without angina pectoris: Secondary | ICD-10-CM | POA: Diagnosis present

## 2018-08-26 DIAGNOSIS — J849 Interstitial pulmonary disease, unspecified: Secondary | ICD-10-CM | POA: Diagnosis present

## 2018-08-26 DIAGNOSIS — Z7901 Long term (current) use of anticoagulants: Secondary | ICD-10-CM | POA: Diagnosis not present

## 2018-08-26 DIAGNOSIS — E871 Hypo-osmolality and hyponatremia: Secondary | ICD-10-CM | POA: Diagnosis not present

## 2018-08-26 DIAGNOSIS — Z888 Allergy status to other drugs, medicaments and biological substances status: Secondary | ICD-10-CM | POA: Diagnosis not present

## 2018-08-26 DIAGNOSIS — N179 Acute kidney failure, unspecified: Secondary | ICD-10-CM | POA: Diagnosis present

## 2018-08-26 DIAGNOSIS — R002 Palpitations: Secondary | ICD-10-CM | POA: Diagnosis not present

## 2018-08-26 DIAGNOSIS — Z803 Family history of malignant neoplasm of breast: Secondary | ICD-10-CM

## 2018-08-26 DIAGNOSIS — G4730 Sleep apnea, unspecified: Secondary | ICD-10-CM | POA: Diagnosis present

## 2018-08-26 DIAGNOSIS — I050 Rheumatic mitral stenosis: Secondary | ICD-10-CM | POA: Diagnosis present

## 2018-08-26 DIAGNOSIS — Z96652 Presence of left artificial knee joint: Secondary | ICD-10-CM | POA: Diagnosis present

## 2018-08-26 DIAGNOSIS — Z885 Allergy status to narcotic agent status: Secondary | ICD-10-CM | POA: Diagnosis not present

## 2018-08-26 DIAGNOSIS — F419 Anxiety disorder, unspecified: Secondary | ICD-10-CM | POA: Diagnosis present

## 2018-08-26 DIAGNOSIS — R0609 Other forms of dyspnea: Secondary | ICD-10-CM | POA: Diagnosis not present

## 2018-08-26 DIAGNOSIS — M109 Gout, unspecified: Secondary | ICD-10-CM | POA: Diagnosis present

## 2018-08-26 DIAGNOSIS — M549 Dorsalgia, unspecified: Secondary | ICD-10-CM | POA: Diagnosis present

## 2018-08-26 DIAGNOSIS — D649 Anemia, unspecified: Secondary | ICD-10-CM | POA: Diagnosis not present

## 2018-08-26 DIAGNOSIS — N183 Chronic kidney disease, stage 3 (moderate): Secondary | ICD-10-CM | POA: Diagnosis present

## 2018-08-26 DIAGNOSIS — J811 Chronic pulmonary edema: Secondary | ICD-10-CM | POA: Diagnosis not present

## 2018-08-26 DIAGNOSIS — Z9981 Dependence on supplemental oxygen: Secondary | ICD-10-CM

## 2018-08-26 DIAGNOSIS — J449 Chronic obstructive pulmonary disease, unspecified: Secondary | ICD-10-CM | POA: Diagnosis present

## 2018-08-26 DIAGNOSIS — Z87442 Personal history of urinary calculi: Secondary | ICD-10-CM

## 2018-08-26 DIAGNOSIS — E875 Hyperkalemia: Secondary | ICD-10-CM | POA: Diagnosis present

## 2018-08-26 LAB — CBC
HCT: 25.6 % — ABNORMAL LOW (ref 36.0–46.0)
Hemoglobin: 8.2 g/dL — ABNORMAL LOW (ref 12.0–15.0)
MCH: 26.6 pg (ref 26.0–34.0)
MCHC: 32 g/dL (ref 30.0–36.0)
MCV: 83.1 fL (ref 80.0–100.0)
Platelets: 283 10*3/uL (ref 150–400)
RBC: 3.08 MIL/uL — ABNORMAL LOW (ref 3.87–5.11)
RDW: 16.5 % — ABNORMAL HIGH (ref 11.5–15.5)
WBC: 16.6 10*3/uL — ABNORMAL HIGH (ref 4.0–10.5)
nRBC: 0.2 % (ref 0.0–0.2)

## 2018-08-26 LAB — SARS CORONAVIRUS 2 BY RT PCR (HOSPITAL ORDER, PERFORMED IN ~~LOC~~ HOSPITAL LAB): SARS Coronavirus 2: NEGATIVE

## 2018-08-26 LAB — TSH: TSH: 0.864 u[IU]/mL (ref 0.350–4.500)

## 2018-08-26 LAB — PROTIME-INR
INR: 2.6 — ABNORMAL HIGH (ref 0.8–1.2)
Prothrombin Time: 27.2 seconds — ABNORMAL HIGH (ref 11.4–15.2)

## 2018-08-26 LAB — LACTIC ACID, PLASMA: Lactic Acid, Venous: 1.1 mmol/L (ref 0.5–1.9)

## 2018-08-26 LAB — BASIC METABOLIC PANEL
Anion gap: 12 (ref 5–15)
BUN: 30 mg/dL — ABNORMAL HIGH (ref 6–20)
CO2: 21 mmol/L — ABNORMAL LOW (ref 22–32)
Calcium: 8.2 mg/dL — ABNORMAL LOW (ref 8.9–10.3)
Chloride: 101 mmol/L (ref 98–111)
Creatinine, Ser: 2.13 mg/dL — ABNORMAL HIGH (ref 0.44–1.00)
GFR calc Af Amer: 30 mL/min — ABNORMAL LOW (ref >60)
GFR calc non Af Amer: 26 mL/min — ABNORMAL LOW (ref >60)
Glucose, Bld: 144 mg/dL — ABNORMAL HIGH (ref 70–99)
Potassium: 4 mmol/L (ref 3.5–5.1)
Sodium: 134 mmol/L — ABNORMAL LOW (ref 135–145)

## 2018-08-26 LAB — URINALYSIS, COMPLETE (UACMP) WITH MICROSCOPIC
Bilirubin Urine: NEGATIVE
Glucose, UA: NEGATIVE mg/dL
Ketones, ur: NEGATIVE mg/dL
Nitrite: NEGATIVE
Protein, ur: 100 mg/dL — AB
Specific Gravity, Urine: 1.011 (ref 1.005–1.030)
WBC, UA: >50 WBC/hpf — ABNORMAL HIGH (ref 0–5)
pH: 5 (ref 5.0–8.0)

## 2018-08-26 LAB — BRAIN NATRIURETIC PEPTIDE
B Natriuretic Peptide: 479 pg/mL — ABNORMAL HIGH (ref 0.0–100.0)
B Natriuretic Peptide: 539 pg/mL — ABNORMAL HIGH (ref 0.0–100.0)

## 2018-08-26 LAB — HEMOGLOBIN A1C
Hgb A1c MFr Bld: 6 % — ABNORMAL HIGH (ref 4.8–5.6)
Mean Plasma Glucose: 125.5 mg/dL

## 2018-08-26 MED ORDER — SODIUM CHLORIDE 0.9 % IV SOLN
2.0000 g | Freq: Once | INTRAVENOUS | Status: AC
  Administered 2018-08-26: 2 g via INTRAVENOUS
  Filled 2018-08-26: qty 20

## 2018-08-26 MED ORDER — SODIUM CHLORIDE 0.9 % IV BOLUS
1000.0000 mL | Freq: Once | INTRAVENOUS | Status: AC
  Administered 2018-08-26: 18:00:00 1000 mL via INTRAVENOUS

## 2018-08-26 MED ORDER — ATORVASTATIN CALCIUM 20 MG PO TABS
40.0000 mg | ORAL_TABLET | Freq: Every day | ORAL | Status: DC
  Administered 2018-08-27 – 2018-08-31 (×5): 40 mg via ORAL
  Filled 2018-08-26 (×5): qty 2

## 2018-08-26 MED ORDER — ALPRAZOLAM 0.5 MG PO TABS
0.5000 mg | ORAL_TABLET | Freq: Every evening | ORAL | Status: DC | PRN
  Administered 2018-08-27 – 2018-08-31 (×4): 0.5 mg via ORAL
  Filled 2018-08-26 (×4): qty 1

## 2018-08-26 MED ORDER — SODIUM CHLORIDE 0.9 % IV BOLUS
1000.0000 mL | Freq: Once | INTRAVENOUS | Status: AC
  Administered 2018-08-26: 1000 mL via INTRAVENOUS

## 2018-08-26 MED ORDER — ACETAMINOPHEN 650 MG RE SUPP: 650.0000 mg | Freq: Four times a day (QID) | RECTAL | Status: DC | PRN

## 2018-08-26 MED ORDER — DOCUSATE SODIUM 100 MG PO CAPS
100.0000 mg | ORAL_CAPSULE | Freq: Two times a day (BID) | ORAL | Status: DC
  Administered 2018-08-26 – 2018-09-01 (×7): 100 mg via ORAL
  Filled 2018-08-26 (×10): qty 1

## 2018-08-26 MED ORDER — METOPROLOL SUCCINATE ER 25 MG PO TB24
25.0000 mg | ORAL_TABLET | Freq: Every day | ORAL | Status: DC
  Administered 2018-08-27 – 2018-08-28 (×2): 25 mg via ORAL
  Filled 2018-08-26 (×2): qty 1

## 2018-08-26 MED ORDER — DILTIAZEM HCL ER COATED BEADS 120 MG PO CP24
240.0000 mg | ORAL_CAPSULE | Freq: Every day | ORAL | Status: DC
  Administered 2018-08-27 – 2018-09-01 (×6): 240 mg via ORAL
  Filled 2018-08-26 (×6): qty 2

## 2018-08-26 MED ORDER — WARFARIN - PHARMACIST DOSING INPATIENT
Freq: Every day | Status: DC
  Administered 2018-08-27 – 2018-08-28 (×2)
  Filled 2018-08-26: qty 1

## 2018-08-26 MED ORDER — POLYETHYLENE GLYCOL 3350 17 G PO PACK: 17.0000 g | PACK | Freq: Every day | ORAL | Status: DC | PRN

## 2018-08-26 MED ORDER — FUROSEMIDE 10 MG/ML IJ SOLN
40.0000 mg | Freq: Two times a day (BID) | INTRAMUSCULAR | Status: DC
  Administered 2018-08-26 – 2018-08-27 (×2): 40 mg via INTRAVENOUS
  Filled 2018-08-26 (×2): qty 4

## 2018-08-26 MED ORDER — PANTOPRAZOLE SODIUM 40 MG PO TBEC
40.0000 mg | DELAYED_RELEASE_TABLET | Freq: Every day | ORAL | Status: DC
  Administered 2018-08-27 – 2018-09-01 (×6): 40 mg via ORAL
  Filled 2018-08-26 (×6): qty 1

## 2018-08-26 MED ORDER — MONTELUKAST SODIUM 10 MG PO TABS
10.0000 mg | ORAL_TABLET | Freq: Every day | ORAL | Status: DC
  Administered 2018-08-26 – 2018-08-31 (×6): 10 mg via ORAL
  Filled 2018-08-26 (×6): qty 1

## 2018-08-26 MED ORDER — WARFARIN SODIUM 2.5 MG PO TABS: 2.5000 mg | ORAL_TABLET | ORAL | Status: DC

## 2018-08-26 MED ORDER — SODIUM CHLORIDE 0.9 % IV SOLN
1.0000 g | Freq: Two times a day (BID) | INTRAVENOUS | Status: DC
  Administered 2018-08-26 – 2018-08-28 (×5): 1 g via INTRAVENOUS
  Filled 2018-08-26 (×7): qty 1

## 2018-08-26 MED ORDER — HYDROMORPHONE HCL 1 MG/ML IJ SOLN
1.0000 mg | Freq: Once | INTRAMUSCULAR | Status: AC
  Administered 2018-08-26: 18:00:00 1 mg via INTRAVENOUS
  Filled 2018-08-26: qty 1

## 2018-08-26 MED ORDER — IPRATROPIUM-ALBUTEROL 0.5-2.5 (3) MG/3ML IN SOLN: 3.0000 mL | Freq: Four times a day (QID) | RESPIRATORY_TRACT | Status: DC | PRN

## 2018-08-26 MED ORDER — ONDANSETRON HCL 4 MG/2ML IJ SOLN
4.0000 mg | Freq: Once | INTRAMUSCULAR | Status: AC
  Administered 2018-08-26: 15:00:00 4 mg via INTRAVENOUS
  Filled 2018-08-26: qty 2

## 2018-08-26 MED ORDER — WARFARIN SODIUM 2.5 MG PO TABS
1.2500 mg | ORAL_TABLET | ORAL | Status: DC
  Administered 2018-08-26: 1.25 mg via ORAL
  Filled 2018-08-26: qty 0.5

## 2018-08-26 MED ORDER — HYDROMORPHONE HCL 1 MG/ML IJ SOLN
1.0000 mg | Freq: Once | INTRAMUSCULAR | Status: AC
  Administered 2018-08-26: 15:00:00 1 mg via INTRAVENOUS
  Filled 2018-08-26: qty 1

## 2018-08-26 MED ORDER — WARFARIN 1.25 MG HALF TABLET
1.2500 mg | ORAL_TABLET | ORAL | Status: DC
  Filled 2018-08-26: qty 1

## 2018-08-26 MED ORDER — ONDANSETRON HCL 4 MG/2ML IJ SOLN
4.0000 mg | Freq: Four times a day (QID) | INTRAMUSCULAR | Status: DC | PRN
  Administered 2018-08-28: 4 mg via INTRAVENOUS
  Filled 2018-08-26: qty 2

## 2018-08-26 MED ORDER — TAMSULOSIN HCL 0.4 MG PO CAPS
0.4000 mg | ORAL_CAPSULE | Freq: Every evening | ORAL | Status: DC
  Administered 2018-08-27 – 2018-08-31 (×5): 0.4 mg via ORAL
  Filled 2018-08-26 (×5): qty 1

## 2018-08-26 MED ORDER — ONDANSETRON HCL 4 MG PO TABS: 4.0000 mg | ORAL_TABLET | Freq: Four times a day (QID) | ORAL | Status: DC | PRN

## 2018-08-26 MED ORDER — ACETAMINOPHEN 325 MG PO TABS
650.0000 mg | ORAL_TABLET | Freq: Four times a day (QID) | ORAL | Status: DC | PRN
  Administered 2018-08-27 (×2): 650 mg via ORAL
  Filled 2018-08-26 (×2): qty 2

## 2018-08-26 NOTE — ED Notes (Signed)
Pt c/o shortness of breath. Pt SpO2 checked by Elmyra Ricks, EDT. SpO2 95%, Heart rate 104. Pt in NAD at this time. Will continue to monitor pt.

## 2018-08-26 NOTE — ED Notes (Signed)
ED TO INPATIENT HANDOFF REPORT  ED Nurse Name and Phone #:  Maudie Mercury [phone]  S Name/Age/Gender Leslie [name] 53 y.o. female Room/Bed: ED17A/ED17A  Code Status   Code Status: Prior  Home/SNF/Other Home Patient oriented to: self, place, time and situation Is this baseline? Yes   Triage Complete: Triage complete  Chief Complaint SOB  diff breath  Triage Note Pt to ED from home c/o right flank pain for about 1 week, states had a ureter stent placed a couple weeks ago.  Has had decreased appetite, some hematuria, pain with urination, foul odor.  States seen at urgent care Thursday dx with UTI and prescribed sulfamethoxazole and tamsulosin.  Also c/o SOB that is worse with exertion.  Chest rise even and unlabored, speaking in complete and coherent sentences, in NAD at this time.   Allergies Allergies  Allergen Reactions  . Morphine Hives and Rash  . Oxycodone Hcl Hives and Itching  . Dilaudid [Hydromorphone Hcl] Itching    Level of Care/Admitting Diagnosis ED Disposition    ED Disposition Condition [facility]: [place] [[id number]]  Level of Care: Med-Surg [16]  Covid Evaluation: N/A  Diagnosis: Sepsis [facility]) [[id number]]  Admitting Physician: Gladstone Lighter [[id number]]  Attending Physician: Gladstone Lighter [[id number]]  Estimated length of stay: past midnight tomorrow  Certification:: I certify this patient will need inpatient services for at least 2 midnights  PT Class (Do Not Modify): Inpatient [101]  PT Acc Code (Do Not Modify): Private [1]       B Medical/Surgery History Past Medical History:  Diagnosis Date  . (HFpEF) heart failure with preserved ejection fraction ([place])    a. 08/2017 Echo: EF 55-60%.  Grade 2 diastolic dysfunction.  Moderate mitral stenosis.  . Allergy   . Anemia   . Anxiety   . BRCA negative 03/22/2013  . Bronchitis 02/19/2016   ON LEVAQUIN PO  . Chest tightness   . CHF (congestive heart failure) ([place])   .  Cigarette smoker 09/11/2017   8-10 day  . GERD (gastroesophageal reflux disease)   . History of kidney stones   . Hyperlipidemia   . Hypertension   . Interstitial lung disease ([place])    a. CT 2013 b. 02/2018 CXR noted recurrent intersistial changes ILD vs chronic bronchitis  . Moderate mitral stenosis    a.  08/2017 TEE: EF 60 to 65%.  Moderate mitral stenosis.  Mean gradient 14 mmHg.  Valve area 2.59 cm by planimetry, 2.72 cm by pressure half-time.  . Palpitations   . Persistent atrial fibrillation    a. 08/2017 s/p TEE/DCCV; b. CHA2DS2VASc = 2-->warfarin.   Past Surgical History:  Procedure Laterality Date  . ABDOMINAL HYSTERECTOMY     total  . ABDOMINAL HYSTERECTOMY    . BREAST BIOPSY Bilateral 2012  . BREAST BIOPSY Right 08-07-12   fibroadenomatous changes and columnar cells  . BREAST BIOPSY  02/03/2015   stereo byrnett  . CHOLECYSTECTOMY N/A 02/27/2016   Procedure: LAPAROSCOPIC CHOLECYSTECTOMY;  Surgeon: Christene Lye, MD;  Location: [facility] ORS;  Service: General;  Laterality: N/A;  . CYSTOSCOPY W/ RETROGRADES Right 08/18/2018   Procedure: CYSTOSCOPY WITH RETROGRADE PYELOGRAM;  Surgeon: Billey Co, MD;  Location: [facility] ORS;  Service: Urology;  Laterality: Right;  . CYSTOSCOPY/URETEROSCOPY/HOLMIUM LASER/STENT PLACEMENT Right 08/18/2018   Procedure: CYSTOSCOPY/URETEROSCOPY/STENT PLACEMENT;  Surgeon: Billey Co, MD;  Location: [facility] ORS;  Service: Urology;  Laterality: Right;  . JOINT REPLACEMENT Left   . KNEE CLOSED  REDUCTION Left 04/15/2015   Procedure: CLOSED MANIPULATION KNEE;  Surgeon: Thornton Park, MD;  Location: [facility] ORS;  Service: Orthopedics;  Laterality: Left;  . KNEE SURGERY    . TEE WITHOUT CARDIOVERSION N/A 09/13/2017   Procedure: TRANSESOPHAGEAL ECHOCARDIOGRAM (TEE);  Surgeon: Nelva Bush, MD;  Location: [facility] ORS;  Service: Cardiovascular;  Laterality: N/A;  . TOTAL KNEE ARTHROPLASTY Left 12/25/2014   Procedure: TOTAL KNEE ARTHROPLASTY;  Surgeon:  Thornton Park, MD;  Location: [facility] OR[facility];  Service: Orthopedics;  Laterality: Left;  . TUBAL LIGATION       A IV Location/Drains/Wounds Patient Lines/Drains/Airways Status   Active Line/Drains/Airways    Name:   Placement date:   Placement time:   Site:   Days:   Peripheral IV 08/26/18 Right Antecubital   08/26/18    1432    Antecubital   less than 1   Ureteral Drain/Stent Right ureter 6 Fr.   08/18/18    0941    Right ureter   8   Incision (Closed) 12/25/14 Knee Left   12/25/14    1601     1340   Incision (Closed) 02/27/16 Abdomen Other (Comment)   02/27/16    1059     911   Incision (Closed) 08/18/18 Vagina   08/18/18    0942     8   Incision - 4 Ports Abdomen Right;Distal Right;Posterior Mid Umbilicus   63/33/54    5625     911          Intake/Output Last 24 hours No intake or output data in the 24 hours ending 08/26/18 1825  Labs/Imaging Results for orders placed or performed during the hospital encounter of 08/26/18 (from the past 48 hour(s))  Basic metabolic panel     Status: Abnormal   Collection Time: 08/26/18 12:28 PM  Result Value Ref Range   Sodium 134 (L) 135 - 145 mmol/L   Potassium 4.0 3.5 - 5.1 mmol/L   Chloride 101 98 - 111 mmol/L   CO2 21 (L) 22 - 32 mmol/L   Glucose, Bld 144 (H) 70 - 99 mg/dL   BUN 30 (H) 6 - 20 mg/dL   Creatinine, Ser 2.13 (H) 0.44 - 1.00 mg/dL   Calcium 8.2 (L) 8.9 - 10.3 mg/dL   GFR calc non Af Amer 26 (L) >60 mL/min   GFR calc Af Amer 30 (L) >60 mL/min   Anion gap 12 5 - 15    Comment: Performed at [facility], [address] [place], [place] [address]  CBC     Status: Abnormal   Collection Time: 08/26/18 12:28 PM  Result Value Ref Range   WBC 16.6 (H) 4.0 - 10.5 K/uL   RBC 3.08 (L) 3.87 - 5.11 MIL/uL   Hemoglobin 8.2 (L) 12.0 - 15.0 g/dL   HCT 25.6 (L) 36.0 - 46.0 %   MCV 83.1 80.0 - 100.0 fL   MCH 26.6 26.0 - 34.0 pg   MCHC 32.0 30.0 - 36.0 g/dL   RDW 16.5 (H) 11.5 - 15.5 %   Platelets 283 150 - 400 K/uL   nRBC  0.2 0.0 - 0.2 %    Comment: Performed at [facility], [address] [place], [place] [address]  Lactic acid, plasma     Status: None   Collection Time: 08/26/18  1:59 PM  Result Value Ref Range   Lactic Acid, Venous 1.1 0.5 - 1.9 mmol/L    Comment: Performed at [facility], [address] [place], [place] [address]  Urinalysis, Complete w Microscopic     Status: Abnormal   Collection Time: 08/26/18  4:32 PM  Result Value Ref Range   Color, Urine AMBER (A) YELLOW    Comment: BIOCHEMICALS MAY BE AFFECTED BY COLOR   APPearance CLOUDY (A) CLEAR   Specific Gravity, Urine 1.011 1.005 - 1.030   pH 5.0 5.0 - 8.0   Glucose, UA NEGATIVE NEGATIVE mg/dL   Hgb urine dipstick MODERATE (A) NEGATIVE   Bilirubin Urine NEGATIVE NEGATIVE   Ketones, ur NEGATIVE NEGATIVE mg/dL   Protein, ur 100 (A) NEGATIVE mg/dL   Nitrite NEGATIVE NEGATIVE   Leukocytes,Ua LARGE (A) NEGATIVE   RBC / HPF 21-50 0 - 5 RBC/hpf   WBC, UA >50 (H) 0 - 5 WBC/hpf   Bacteria, UA FEW (A) NONE SEEN   Squamous Epithelial / LPF 0-5 0 - 5   WBC Clumps PRESENT    Mucus PRESENT    Amorphous Crystal PRESENT     Comment: Performed at [facility], [address], [place],  [address]  SARS Coronavirus 2 [facility] order, Performed in [facility] hospital lab) Nasopharyngeal Nasopharyngeal Swab     Status: None   Collection Time: 08/26/18  4:34 PM   Specimen: Nasopharyngeal Swab  Result Value Ref Range   SARS Coronavirus 2 NEGATIVE NEGATIVE    Comment: (NOTE) If result is NEGATIVE SARS-CoV-2 target nucleic acids are NOT DETECTED. The SARS-CoV-2 RNA is generally detectable in upper and lower  respiratory specimens during the acute phase of infection. The lowest  concentration of SARS-CoV-2 viral copies this assay can detect is 250  copies / mL. A negative result does not preclude SARS-CoV-2 infection  and should not be used as the sole basis for treatment or other  patient management  decisions.  A negative result may occur with  improper specimen collection / handling, submission of specimen other  than nasopharyngeal swab, presence of viral mutation(s) within the  areas targeted by this assay, and inadequate number of viral copies  (<250 copies / mL). A negative result must be combined with clinical  observations, patient history, and epidemiological information. If result is POSITIVE SARS-CoV-2 target nucleic acids are DETECTED. The SARS-CoV-2 RNA is generally detectable in upper and lower  respiratory specimens dur ing the acute phase of infection.  Positive  results are indicative of active infection with SARS-CoV-2.  Clinical  correlation with patient history and other diagnostic information is  necessary to determine patient infection status.  Positive results do  not rule out bacterial infection or co-infection with other viruses. If result is PRESUMPTIVE POSTIVE SARS-CoV-2 nucleic acids MAY BE PRESENT.   A presumptive positive result was obtained on the submitted specimen  and confirmed on repeat testing.  While 2019 novel coronavirus  (SARS-CoV-2) nucleic acids may be present in the submitted sample  additional confirmatory testing may be necessary for epidemiological  and / or clinical management purposes  to differentiate between  SARS-CoV-2 and other Sarbecovirus currently known to infect humans.  If clinically indicated additional testing with an alternate test  methodology [phone]) is advised. The SARS-CoV-2 RNA is generally  detectable in upper and lower respiratory sp ecimens during the acute  phase of infection. The expected result is Negative. Fact Sheet for Patients:  StrictlyIdeas.no Fact Sheet for Healthcare Providers: BankingDealers.co.za This test is not yet approved or cleared by the [place] FDA and has been authorized for detection and/or diagnosis of SARS-CoV-2 by FDA under  an  Emergency Use Authorization (EUA).  This EUA will remain in effect (meaning this test can be used) for the duration of the COVID-19 declaration under Section 564(b)(1) of the Act, 21 U.S.C. section 360bbb-3(b)(1), unless the authorization is terminated or revoked sooner. Performed at [facility], [address] [place], [place] [address]    Dg Chest 2 View  Result Date: 08/26/2018 CLINICAL DATA:  Former smoker. Shortness of breath. History of interstitial lung disease. EXAM: CHEST - 2 VIEW COMPARISON:  March 16, 2018 FINDINGS: No pneumothorax. Increasing bilateral pulmonary opacities, particularly in the bases, worsened in the interval. The heart size borderline. The hila and mediastinum are unremarkable. IMPRESSION: The findings may represent atypical infection versus pulmonary edema. Recommend clinical correlation. Electronically Signed   By: Dorise Bullion III M.D   On: 08/26/2018 15:51   Ct Renal Stone Study  Result Date: 08/26/2018 CLINICAL DATA:  Flank pain question recurrent stone disease EXAM: CT ABDOMEN AND PELVIS WITHOUT CONTRAST TECHNIQUE: Multidetector CT imaging of the abdomen and pelvis was performed following the standard protocol without IV contrast. Sagittal and coronal MPR images reconstructed from axial data set. Oral contrast not administered for this indication COMPARISON:  07/18/2018 FINDINGS: Lower chest: Small BILATERAL pleural effusions and mild bibasilar atelectasis Hepatobiliary: Gallbladder surgically absent.  Liver unremarkable. Pancreas: Normal appearance Spleen: Normal appearance Adrenals/Urinary Tract: Adrenal glands normal appearance. LEFT kidney normal appearance. RIGHT kidney enlarged with diffuse perinephric edema. Mild dilatation of RIGHT renal collecting system and RIGHT ureter. Bladder unremarkable. No ureteral calcification identified. Again seen 8 mm calculus at mid RIGHT kidney, nonobstructing. Again seen cyst at upper pole of RIGHT kidney, 3.0 x  2.9 cm, stable. Stomach/Bowel: Normal appendix. Stomach and bowel loops normal appearance Vascular/Lymphatic: Atherosclerotic calcifications aorta and iliac arteries without aneurysm. No adenopathy. Reproductive: Uterus surgically absent with nonvisualization of ovaries Other: No free air or free fluid.  No hernia. Musculoskeletal: No acute osseous findings. IMPRESSION: Enlargement of RIGHT kidney and perinephric edema with dilatation of RIGHT renal collecting system and RIGHT ureter. No obstructing ureteral calculus identified. Findings are most suspicious for a passed calculus though infection/pyelonephritis not completely excluded; recommend correlation with urinalysis. Electronically Signed   By: Lavonia Dana M.D.   On: 08/26/2018 15:35    Pending Labs Unresulted Labs (From admission, onward)    Start     Ordered   08/26/18 1802  Brain natriuretic peptide  Once,   STAT     08/26/18 1801   08/26/18 1800  Protime-INR  Once-Timed,   STAT     08/26/18 1759   08/26/18 1359  Blood culture (routine x 2)  BLOOD CULTURE X 2,   STAT    Question:  Patient immune status  Answer:  Normal   08/26/18 1359   08/26/18 1359  Urine culture  ONCE - STAT,   STAT    Question:  Patient immune status  Answer:  Normal   08/26/18 1359   08/26/18 1359  Lactic acid, plasma  Now then every 2 hours,   STAT     08/26/18 1359   Signed and Held  HIV antibody (Routine Testing)  Once,   R     Signed and Held   Signed and Held  Basic metabolic panel  Tomorrow morning,   R     Signed and Held   Signed and Held  CBC  Tomorrow morning,   R     Signed and Held   Signed and Held  TSH  Once,   R     Signed and Held   Signed and Held  Hemoglobin A1c  Once,   R     Signed and Held   Signed and Held  Brain natriuretic peptide  Once,   R     Signed and Held          Vitals/Pain Today's Vitals   08/26/18 1600 08/26/18 1700 08/26/18 1745 08/26/18 1748  BP: 124/61 (!) 101/55 (!) 99/55   Pulse: (!) 106 (!) 103 (!) 105    Resp: (!) 22 19 (!) 21   Temp:      TempSrc:      SpO2: 91% 100% 100%   Weight:      Height:      PainSc:    6     Isolation Precautions No active isolations  Medications Medications  furosemide (LASIX) injection 40 mg (has no administration in time range)  cefTRIAXone (ROCEPHIN) 2 g in sodium chloride 0.9 % 100 mL IVPB (0 g Intravenous Stopped 08/26/18 1739)  sodium chloride 0.9 % bolus 1,000 mL (1,000 mLs Intravenous New Bag/Given 08/26/18 1502)  HYDROmorphone (DILAUDID) injection 1 mg (1 mg Intravenous Given 08/26/18 1500)  ondansetron (ZOFRAN) injection 4 mg (4 mg Intravenous Given 08/26/18 1500)  sodium chloride 0.9 % bolus 1,000 mL (1,000 mLs Intravenous New Bag/Given 08/26/18 1746)  HYDROmorphone (DILAUDID) injection 1 mg (1 mg Intravenous Given 08/26/18 1741)    Mobility walks Low fall risk   Focused Assessments Cardiac Assessment Handoff:  Cardiac Rhythm: Sinus tachycardia Lab Results  Component Value Date   TROPONINI <0.03 02/07/2018   No results found for: DDIMER Does the Patient currently have chest pain? No    R Recommendations: See Admitting Provider Note  Report given to:   Additional Notes:

## 2018-08-26 NOTE — ED Triage Notes (Signed)
Pt to ED from home c/o right flank pain for about 1 week, states had a ureter stent placed a couple weeks ago.  Has had decreased appetite, some hematuria, pain with urination, foul odor.  States seen at urgent care Thursday dx with UTI and prescribed sulfamethoxazole and tamsulosin.  Also c/o SOB that is worse with exertion.  Chest rise even and unlabored, speaking in complete and coherent sentences, in NAD at this time.

## 2018-08-26 NOTE — H&P (Signed)
[facility] NAME: Leslie [name]    MR#:  [id number]  DATE OF BIRTH:  [birth year]  DATE OF ADMISSION:  08/26/2018  PRIMARY CARE PHYSICIAN: Ronnell Freshwater, NP   REQUESTING/REFERRING PHYSICIAN: Dr. Fonnie Birkenhead  CHIEF COMPLAINT:   Chief Complaint  Patient presents with   Flank Pain    HISTORY OF PRESENT ILLNESS:  Leslie [name]  is a 53 y.o. female with a known history of persistent atrial fibrillation status post TEE cardioversion on Coumadin, hypertension, history of interstitial lung disease, diastolic heart failure, GERD, obesity and kidney stones presents to hospital secondary to worsening abdominal pain, nausea, shortness of breath and dysuria. Patient has known history of nephrolithiasis and has been following up with urology as she had low back pain and 9 mm stone over right renal calculus.  Prior history of shockwave lithotripsy noted.  Patient had cystoscopy with diagnostic right ureteroscopy with retrograde pyelogram and right ureteral stent placement on 08/18/2018.  She thinks she was discharged on on an antibiotic after her procedure.  Patients' stent was pulled out 3 days later at home.  She continued to have low back pain.  She was given a prescription for Toradol and advised to take Motrin for pain.  Patient continued to have intermittent hematuria but worsening dysuria since the stent removal.  She went to urgent care 3 days ago for similar symptoms, was given a prescription for Flomax and her antibiotic was continued.  She started having subjective fevers chills, worsening shortness of breath in the last couple of days and so presented to the emergency room.  Denies any chest pain or palpitations. She was noted to be tachycardic, in sinus rhythm here.  WBC count is elevated at 16 K.  Urine analysis indicating significant infection.  So she is being admitted for sepsis.  PAST MEDICAL HISTORY:   Past Medical History:    Diagnosis Date   (HFpEF) heart failure with preserved ejection fraction ([place])    a. 08/2017 Echo: EF 55-60%.  Grade 2 diastolic dysfunction.  Moderate mitral stenosis.   Allergy    Anemia    Anxiety    BRCA negative 03/22/2013   Bronchitis 02/19/2016   ON LEVAQUIN PO   Chest tightness    CHF (congestive heart failure) (HCC)    Cigarette smoker 09/11/2017   8-10 day   GERD (gastroesophageal reflux disease)    History of kidney stones    Hyperlipidemia    Hypertension    Interstitial lung disease ([place])    a. CT 2013 b. 02/2018 CXR noted recurrent intersistial changes ILD vs chronic bronchitis   Moderate mitral stenosis    a.  08/2017 TEE: EF 60 to 65%.  Moderate mitral stenosis.  Mean gradient 14 mmHg.  Valve area 2.59 cm by planimetry, 2.72 cm by pressure half-time.   Palpitations    Persistent atrial fibrillation    a. 08/2017 s/p TEE/DCCV; b. CHA2DS2VASc = 2-->warfarin.    PAST SURGICAL HISTORY:   Past Surgical History:  Procedure Laterality Date   ABDOMINAL HYSTERECTOMY     total   ABDOMINAL HYSTERECTOMY     BREAST BIOPSY Bilateral 2012   BREAST BIOPSY Right 08-07-12   fibroadenomatous changes and columnar cells   BREAST BIOPSY  02/03/2015   stereo byrnett   CHOLECYSTECTOMY N/A 02/27/2016   Procedure: LAPAROSCOPIC CHOLECYSTECTOMY;  Surgeon: Christene Lye, MD;  Location: [facility];  Service: General;  Laterality: N/A;   CYSTOSCOPY W/  RETROGRADES Right 08/18/2018   Procedure: CYSTOSCOPY WITH RETROGRADE PYELOGRAM;  Surgeon: Billey Co, MD;  Location: [facility];  Service: Urology;  Laterality: Right;   CYSTOSCOPY/URETEROSCOPY/HOLMIUM LASER/STENT PLACEMENT Right 08/18/2018   Procedure: CYSTOSCOPY/URETEROSCOPY/STENT PLACEMENT;  Surgeon: Billey Co, MD;  Location: [facility];  Service: Urology;  Laterality: Right;   JOINT REPLACEMENT Left    KNEE CLOSED REDUCTION Left 04/15/2015   Procedure: CLOSED MANIPULATION KNEE;  Surgeon: Thornton Park, MD;  Location: [facility];  Service: Orthopedics;  Laterality: Left;   KNEE SURGERY     TEE WITHOUT CARDIOVERSION N/A 09/13/2017   Procedure: TRANSESOPHAGEAL ECHOCARDIOGRAM (TEE);  Surgeon: Nelva Bush, MD;  Location: [facility];  Service: Cardiovascular;  Laterality: N/A;   TOTAL KNEE ARTHROPLASTY Left 12/25/2014   Procedure: TOTAL KNEE ARTHROPLASTY;  Surgeon: Thornton Park, MD;  Location: [facility];  Service: Orthopedics;  Laterality: Left;   TUBAL LIGATION      SOCIAL HISTORY:   Social History   Tobacco Use   Smoking status: Former Smoker    Packs/day: 0.50    Years: 20.00    Pack years: 10.00    Types: Cigarettes    Quit date: 02/20/2018    Years since quitting: 0.5   Smokeless tobacco: Never Used   Tobacco comment: down to 2-3 daily  Substance Use Topics   Alcohol use: Yes    Alcohol/week: 2.0 standard drinks    Types: 2 Glasses of wine per week    Comment: wine occ    FAMILY HISTORY:   Family History  Problem Relation Age of Onset   Cancer Mother 60       breast   Cancer Maternal Aunt        breast   Cancer Maternal Grandmother        breast    DRUG ALLERGIES:   Allergies  Allergen Reactions   Morphine Hives and Rash   Oxycodone Hcl Hives and Itching   Dilaudid [Hydromorphone Hcl] Itching    REVIEW OF SYSTEMS:   Review of Systems  Constitutional: Positive for malaise/fatigue. Negative for chills, fever and weight loss.  HENT: Negative for ear discharge, ear pain, hearing loss and nosebleeds.   Eyes: Negative for blurred vision, double vision and photophobia.  Respiratory: Positive for shortness of breath. Negative for cough, hemoptysis and wheezing.   Cardiovascular: Positive for orthopnea. Negative for chest pain, palpitations and leg swelling.  Gastrointestinal: Positive for abdominal pain and nausea. Negative for constipation, diarrhea, heartburn, melena and vomiting.  Genitourinary: Positive for dysuria, frequency and  hematuria. Negative for urgency.  Musculoskeletal: Positive for back pain and myalgias. Negative for neck pain.  Skin: Negative for rash.  Neurological: Negative for dizziness, tingling, tremors, sensory change, speech change, focal weakness and headaches.  Endo/Heme/Allergies: Does not bruise/bleed easily.  Psychiatric/Behavioral: Negative for depression.    MEDICATIONS AT HOME:   Prior to Admission medications   Medication Sig Start Date End Date Taking? Authorizing Provider  albuterol (VENTOLIN HFA) 108 (90 Base) MCG/ACT inhaler Inhale 2 puffs into the lungs every 4 (four) hours as needed for wheezing or shortness of breath. 06/16/18  Yes Boscia, Greer Ee, NP  ALPRAZolam (XANAX) 0.5 MG tablet Take 1 tablet (0.5 mg total) by mouth at bedtime as needed for anxiety. 06/19/18  Yes Ronnell Freshwater, NP  atorvastatin (LIPITOR) 40 MG tablet Take 1 tablet (40 mg total) by mouth daily at 6 PM. 02/28/18  Yes Theora Gianotti, NP  celecoxib (CELEBREX) 200 MG capsule Take 200  mg by mouth at bedtime as needed for mild pain.  06/19/18  Yes [provider]  diltiazem (CARDIZEM CD) 240 MG 24 hr capsule Take 1 capsule (240 mg total) by mouth daily. 03/23/18  Yes Gollan, Kathlene November, MD  estradiol (ESTRACE) 0.5 MG tablet Take 1 tablet (0.5 mg total) by mouth daily. 08/04/18  Yes Boscia, Greer Ee, NP  furosemide (LASIX) 20 MG tablet Take 1 tablet (20 mg) by mouth once daily. You may take 1 extra tablet (20 mg) after lunch as needed for leg swelling Patient taking differently: Take 20-40 mg by mouth See admin instructions. Take 40 mg in the morning and 20 mg in the evening 05/11/18  Yes Gollan, Kathlene November, MD  metoprolol succinate (TOPROL-XL) 25 MG 24 hr tablet Take 1 tablet (25 mg total) by mouth daily. Take with or immediately following a meal. 06/15/18  Yes Gollan, Kathlene November, MD  montelukast (SINGULAIR) 10 MG tablet Take 1 tablet (10 mg total) by mouth at bedtime. 04/18/18  Yes Boscia, Greer Ee, NP    naproxen sodium (ALEVE) 220 MG tablet Take 440 mg by mouth daily as needed (pain).   Yes [provider]  omeprazole (PRILOSEC) 40 MG capsule Take 1 capsule (40 mg total) by mouth daily. 06/22/18  Yes Boscia, Greer Ee, NP  potassium chloride (K-DUR) 10 MEQ tablet Take 2 tablets (20 meq) by mouth once daily Patient taking differently: Take 20 mEq by mouth daily.  04/10/18  Yes Minna Merritts, MD  sulfamethoxazole-trimethoprim (BACTRIM DS) 800-160 MG tablet Take 1 tablet by mouth daily. 08/18/18  Yes Billey Co, MD  tamsulosin (FLOMAX) 0.4 MG CAPS capsule Take 0.4 mg by mouth every evening.    Yes [provider]  warfarin (COUMADIN) 2.5 MG tablet TAKE 1/2 TO 1 TABLET DAILY AS DIRECTED BY COUMADIN CLINIC. Patient taking differently: Take 1.25-2.5 mg by mouth See admin instructions. Take 2.5 mg at night on Mon, Wed, and Fri.  Take 1.25 mg at night on Sun, Tue, Thur, and Sat 04/07/18  Yes Gollan, Kathlene November, MD  chlorpheniramine-HYDROcodone [facility] PENNKINETIC ER) 10-8 MG/5ML SUER Take 5 mLs by mouth every 12 (twelve) hours as needed for cough. Patient not taking: Reported on 08/26/2018 07/03/18   Ronnell Freshwater, NP      VITAL SIGNS:  Blood pressure (!) 99/55, pulse (!) 105, temperature 99.1 F (37.3 C), temperature source Oral, resp. rate (!) 21, height '5\' 4"'  (1.626 m), weight 101.2 kg, SpO2 100 %.  PHYSICAL EXAMINATION:  Physical Exam  GENERAL:  53 y.o.-year-old morbidly obese patient lying in the bed and complaining of shortness of breath.  EYES: Pupils equal, round, reactive to light and accommodation. No scleral icterus. Extraocular muscles intact.  HEENT: Head atraumatic, normocephalic. Oropharynx and nasopharynx clear.  NECK:  Supple, no jugular venous distention. No thyroid enlargement, no tenderness.  LUNGS: Normal breath sounds bilaterally, no wheezing, rales,rhonchi or crepitation. No use of accessory muscles of respiration.  Decreased bibasilar breath  sounds CARDIOVASCULAR: S1, S2 rapid and regular. No murmurs, rubs, or gallops.  ABDOMEN: Soft, generalized abdominal tenderness on palpation, worse in the right costovertebral angle, nondistended. Bowel sounds present. No organomegaly or mass.  EXTREMITIES: No  cyanosis, or clubbing.  Trace pedal edema NEUROLOGIC: Cranial nerves II through XII are intact. Muscle strength 5/5 in all extremities. Sensation intact. Gait not checked.  Generalized weakness. PSYCHIATRIC: The patient is alert and oriented x 3.  SKIN: No obvious rash, lesion, or ulcer.   LABORATORY  PANEL:   CBC Recent Labs  Lab 08/26/18 1228  WBC 16.6*  HGB 8.2*  HCT 25.6*  PLT 283   ------------------------------------------------------------------------------------------------------------------  Chemistries  Recent Labs  Lab 08/26/18 1228  NA 134*  K 4.0  CL 101  CO2 21*  GLUCOSE 144*  BUN 30*  CREATININE 2.13*  CALCIUM 8.2*   ------------------------------------------------------------------------------------------------------------------  Cardiac Enzymes No results for input(s): TROPONINI in the last 168 hours. ------------------------------------------------------------------------------------------------------------------  RADIOLOGY:  Dg Chest 2 View  Result Date: 08/26/2018 CLINICAL DATA:  Former smoker. Shortness of breath. History of interstitial lung disease. EXAM: CHEST - 2 VIEW COMPARISON:  March 16, 2018 FINDINGS: No pneumothorax. Increasing bilateral pulmonary opacities, particularly in the bases, worsened in the interval. The heart size borderline. The hila and mediastinum are unremarkable. IMPRESSION: The findings may represent atypical infection versus pulmonary edema. Recommend clinical correlation. Electronically Signed   By: Dorise Bullion III M.D   On: 08/26/2018 15:51   Ct Renal Stone Study  Result Date: 08/26/2018 CLINICAL DATA:  Flank pain question recurrent stone disease EXAM: CT  ABDOMEN AND PELVIS WITHOUT CONTRAST TECHNIQUE: Multidetector CT imaging of the abdomen and pelvis was performed following the standard protocol without IV contrast. Sagittal and coronal MPR images reconstructed from axial data set. Oral contrast not administered for this indication COMPARISON:  07/18/2018 FINDINGS: Lower chest: Small BILATERAL pleural effusions and mild bibasilar atelectasis Hepatobiliary: Gallbladder surgically absent.  Liver unremarkable. Pancreas: Normal appearance Spleen: Normal appearance Adrenals/Urinary Tract: Adrenal glands normal appearance. LEFT kidney normal appearance. RIGHT kidney enlarged with diffuse perinephric edema. Mild dilatation of RIGHT renal collecting system and RIGHT ureter. Bladder unremarkable. No ureteral calcification identified. Again seen 8 mm calculus at mid RIGHT kidney, nonobstructing. Again seen cyst at upper pole of RIGHT kidney, 3.0 x 2.9 cm, stable. Stomach/Bowel: Normal appendix. Stomach and bowel loops normal appearance Vascular/Lymphatic: Atherosclerotic calcifications aorta and iliac arteries without aneurysm. No adenopathy. Reproductive: Uterus surgically absent with nonvisualization of ovaries Other: No free air or free fluid.  No hernia. Musculoskeletal: No acute osseous findings. IMPRESSION: Enlargement of RIGHT kidney and perinephric edema with dilatation of RIGHT renal collecting system and RIGHT ureter. No obstructing ureteral calculus identified. Findings are most suspicious for a passed calculus though infection/pyelonephritis not completely excluded; recommend correlation with urinalysis. Electronically Signed   By: Lavonia Dana M.D.   On: 08/26/2018 15:35      IMPRESSION AND PLAN:   Leslie [name]  is a 53 y.o. female with a known history of persistent atrial fibrillation status post TEE cardioversion on Coumadin, hypertension, history of interstitial lung disease, diastolic heart failure, GERD, obesity and kidney stones presents to hospital  secondary to worsening abdominal pain, nausea, shortness of breath and dysuria.  1.  Sepsis-likely secondary to acute pyelonephritis on the right side -Also had a recent right ureteral stent 1 week ago which has been removed now. -Blood cultures and urine cultures.  Lactic acid is within normal limits. -Started on meropenem-given complicated UTI and recent use of Bactrim as outpatient.  2.  Acute renal failure on CKD stage III-Baseline creatinine seems to be around 1.2.  Creatinine elevated 2.1 -Also has been taking Toradol for her back pain as needed -Avoid nephrotoxins -No recent IV contrast -CT of the abdomen showing enlargement of right kidney with some perinephric edema, no obstructing calculus noted. -We will hold off on fluids today given shortness of breath and possible pulmonary edema on chest x-ray. -If no improvement, consider nephrology consultation  3.  Dyspnea-feels significantly  short of breath, currently needing 1 to 2 L supplemental oxygen -Chest x-ray showing bibasilar increased interstitial markings could be pulmonary edema versus atypical pneumonitis. -Chest x-ray from February also showing coarse interstitial markings.  Possible chronic interstitial lung disease.  Prior history of smoking -Check BNP.  1 dose of IV Lasix being given -Known history of diastolic heart failure with last echo showing normal ejection fraction.  Also complains of 1+ pedal edema in the last week. -Plain CT chest without contrast ordered   4.  Persistent atrial fibrillation-currently remains in normal sinus rhythm.  History of TEE cardioversion in the past. -Continue oral Cardizem and metoprolol -On warfarin for anticoagulation.  Pharmacy to adjust the dose  5.  GERD-Protonix  6.  DVT prophylaxis-already on warfarin     All the records are reviewed and case discussed with ED provider. Management plans discussed with the patient, family and they are in agreement.  CODE STATUS: Full  Code  TOTAL TIME TAKING CARE OF THIS PATIENT: 52 minutes.    Gladstone Lighter M.D on 08/26/2018 at 6:17 PM  Between 7am to 6pm - Pager - [phone]  After 6pm go to [email or web address] - Proofreader  [facility]  Office  [phone]  CC: Primary care physician; Ronnell Freshwater, NP   Note: This dictation was prepared with Dragon dictation along with smaller phrase technology. Any transcriptional errors that result from this process are unintentional.

## 2018-08-26 NOTE — Progress Notes (Signed)
Pharmacy Antibiotic Note  Leslie [name] is a 53 y.o. female admitted on 08/26/2018 with pyelonephritis.  Pharmacy has been consulted for Meropenem dosing.  Plan: Meropenem 1 gm IV Q12H ordered to start on 8/8 @ 2030.   CrCl = 38.9 ml/min  Height: 5\' 4"  (162.6 cm) Weight: 258 lb 2.5 oz (117.1 kg) IBW/kg (Calculated) : 54.7  Temp (24hrs), Avg:98.6 F (37 C), Min:97.7 F (36.5 C), Max:99.1 F (37.3 C)  Recent Labs  Lab 08/26/18 1228 08/26/18 1359  WBC 16.6*  --   CREATININE 2.13*  --   LATICACIDVEN  --  1.1    Estimated Creatinine Clearance: 38.9 mL/min (A) (by C-G formula based on SCr of 2.13 mg/dL (H)).    Allergies  Allergen Reactions  . Morphine Hives and Rash  . Oxycodone Hcl Hives and Itching  . Dilaudid [Hydromorphone Hcl] Itching    Antimicrobials this admission:   >>    >>   Dose adjustments this admission:   Microbiology results:  BCx:   UCx:    Sputum:    MRSA PCR:   Thank you for allowing pharmacy to be a part of this patient's care.  Sacheen Arrasmith D 08/26/2018 8:24 PM

## 2018-08-26 NOTE — Progress Notes (Signed)
ANTICOAGULATION CONSULT NOTE - Initial Consult  Pharmacy Consult for Warfarin  Indication: atrial fibrillation  Allergies  Allergen Reactions  . Morphine Hives and Rash  . Oxycodone Hcl Hives and Itching  . Dilaudid [Hydromorphone Hcl] Itching    Patient Measurements: Height: '5\' 4"'  (162.6 cm) Weight: 223 lb (101.2 kg) IBW/kg (Calculated) : 54.7 Heparin Dosing Weight:   Vital Signs: Temp: 99 F (37.2 C) (08/08 1930) Temp Source: Oral (08/08 1930) BP: 128/80 (08/08 1930) Pulse Rate: 111 (08/08 1930)  Labs: Recent Labs    08/26/18 1221 08/26/18 1228  HGB  --  8.2*  HCT  --  25.6*  PLT  --  283  LABPROT 27.2*  --   INR 2.6*  --   CREATININE  --  2.13*    Estimated Creatinine Clearance: 35.8 mL/min (A) (by C-G formula based on SCr of 2.13 mg/dL (H)).   Medical History: Past Medical History:  Diagnosis Date  . (HFpEF) heart failure with preserved ejection fraction ([place])    a. 08/2017 Echo: EF 55-60%.  Grade 2 diastolic dysfunction.  Moderate mitral stenosis.  . Allergy   . Anemia   . Anxiety   . BRCA negative 03/22/2013  . Bronchitis 02/19/2016   ON LEVAQUIN PO  . Chest tightness   . CHF (congestive heart failure) ([place])   . Cigarette smoker 09/11/2017   8-10 day  . GERD (gastroesophageal reflux disease)   . History of kidney stones   . Hyperlipidemia   . Hypertension   . Interstitial lung disease ([place])    a. CT 2013 b. 02/2018 CXR noted recurrent intersistial changes ILD vs chronic bronchitis  . Moderate mitral stenosis    a.  08/2017 TEE: EF 60 to 65%.  Moderate mitral stenosis.  Mean gradient 14 mmHg.  Valve area 2.59 cm by planimetry, 2.72 cm by pressure half-time.  . Palpitations   . Persistent atrial fibrillation    a. 08/2017 s/p TEE/DCCV; b. CHA2DS2VASc = 2-->warfarin.    Medications:  Medications Prior to Admission  Medication Sig Dispense Refill Last Dose  . albuterol (VENTOLIN HFA) 108 (90 Base) MCG/ACT inhaler Inhale 2 puffs into the lungs  every 4 (four) hours as needed for wheezing or shortness of breath. 3 Inhaler 0 Unknown at PRN  . ALPRAZolam (XANAX) 0.5 MG tablet Take 1 tablet (0.5 mg total) by mouth at bedtime as needed for anxiety. 30 tablet 2 Unknown at PRN  . atorvastatin (LIPITOR) 40 MG tablet Take 1 tablet (40 mg total) by mouth daily at 6 PM. 90 tablet 3 08/25/2018 at 1800  . celecoxib (CELEBREX) 200 MG capsule Take 200 mg by mouth at bedtime as needed for mild pain.    Unknown at PRN  . diltiazem (CARDIZEM CD) 240 MG 24 hr capsule Take 1 capsule (240 mg total) by mouth daily. 90 capsule 1 08/26/2018 at 0730  . estradiol (ESTRACE) 0.5 MG tablet Take 1 tablet (0.5 mg total) by mouth daily. 90 tablet 1 08/26/2018 at 0730  . furosemide (LASIX) 20 MG tablet Take 1 tablet (20 mg) by mouth once daily. You may take 1 extra tablet (20 mg) after lunch as needed for leg swelling (Patient taking differently: Take 20-40 mg by mouth See admin instructions. Take 40 mg in the morning and 20 mg in the evening) 135 tablet 2 08/26/2018 at 0730  . metoprolol succinate (TOPROL-XL) 25 MG 24 hr tablet Take 1 tablet (25 mg total) by mouth daily. Take with or immediately following a meal.  90 tablet 0 08/26/2018 at 0730  . montelukast (SINGULAIR) 10 MG tablet Take 1 tablet (10 mg total) by mouth at bedtime. 90 tablet 2 08/25/2018 at 2100  . naproxen sodium (ALEVE) 220 MG tablet Take 440 mg by mouth daily as needed (pain).   Unknown at PRN  . omeprazole (PRILOSEC) 40 MG capsule Take 1 capsule (40 mg total) by mouth daily. 90 capsule 1 08/26/2018 at 0730  . potassium chloride (K-DUR) 10 MEQ tablet Take 2 tablets (20 meq) by mouth once daily (Patient taking differently: Take 20 mEq by mouth daily. ) 180 tablet 2 08/25/2018 at 0700  . sulfamethoxazole-trimethoprim (BACTRIM DS) 800-160 MG tablet Take 1 tablet by mouth daily. 4 tablet 0 08/25/2018 at 1800  . tamsulosin (FLOMAX) 0.4 MG CAPS capsule Take 0.4 mg by mouth every evening.    08/25/2018 at 1800  . warfarin  (COUMADIN) 2.5 MG tablet TAKE 1/2 TO 1 TABLET DAILY AS DIRECTED BY COUMADIN CLINIC. (Patient taking differently: Take 1.25-2.5 mg by mouth See admin instructions. Take 2.5 mg at night on Mon, Wed, and Fri.  Take 1.25 mg at night on Sun, Tue, Thur, and Sat) 90 tablet 1 08/25/2018 at 2100  . chlorpheniramine-HYDROcodone (TUSSIONEX PENNKINETIC ER) 10-8 MG/5ML SUER Take 5 mLs by mouth every 12 (twelve) hours as needed for cough. (Patient not taking: Reported on 08/26/2018) 115 mL 0 Not Taking at Unknown time    Assessment: Pharmacy consulted to dose warfarin in this 53 year old female with Afib.  Pt was on Warfarin 2.5 mg PO MWF and warfarin 1.25 mg PO T-Th-Sat-Sun.    Last dose on 8/7.    8/8 INR = 2.6  Goal of Therapy:  INR 2-3   Plan:  Will continue pt home dose of warfarin to resume 8/8 and recheck INR on 8/9 with AM labs.   Jourdin Connors D 08/26/2018,7:59 PM

## 2018-08-26 NOTE — ED Notes (Signed)
First Nurse Note: Pt to ED c/o difficulty breathing. Pt is in NAD

## 2018-08-26 NOTE — ED Provider Notes (Signed)
[facility] Emergency Department Provider Note  ____________________________________________   First MD Initiated Contact with Patient 08/26/18 1340     (approximate)  I have reviewed the triage vital signs and the nursing notes.   HISTORY  Chief Complaint Flank Pain    HPI Leslie [name] is a 53 y.o. female  With PMhx as below here with flank pain, nausea, vomiting, weakness, and SOB. Pt states that she recently underwent ureteral stent placement. This was removed several days ago. She was also dx with UTi last week. She's been on ABX but has had persistent R>L flank pain, foul smelling urine, nausea, vomiting, and poor appetite. Over the past 3-4 days, she's also develoepd chills and intermittent palpitations with SOB. She feels like her heart is beating quickly and her HR has been beating over 100 consistently at home, despite taking higher doses of metoprolol. She's been taking high doses of ibuprofen and naproxen as well without relief of her pain. Pain is aching, gnawing, primarily on R flank radiating towards lower abdomen. ABX do not seem to be helping her urinary sx. No alleviating factors.     Past Medical History:  Diagnosis Date  . (HFpEF) heart failure with preserved ejection fraction ([place])    a. 08/2017 Echo: EF 55-60%.  Grade 2 diastolic dysfunction.  Moderate mitral stenosis.  . Allergy   . Anemia   . Anxiety   . BRCA negative 03/22/2013  . Bronchitis 02/19/2016   ON LEVAQUIN PO  . Chest tightness   . CHF (congestive heart failure) ([place])   . Cigarette smoker 09/11/2017   8-10 day  . GERD (gastroesophageal reflux disease)   . History of kidney stones   . Hyperlipidemia   . Hypertension   . Interstitial lung disease ([place])    a. CT 2013 b. 02/2018 CXR noted recurrent intersistial changes ILD vs chronic bronchitis  . Moderate mitral stenosis    a.  08/2017 TEE: EF 60 to 65%.  Moderate mitral stenosis.  Mean gradient 14 mmHg.  Valve area  2.59 cm by planimetry, 2.72 cm by pressure half-time.  . Palpitations   . Persistent atrial fibrillation    a. 08/2017 s/p TEE/DCCV; b. CHA2DS2VASc = 2-->warfarin.    Patient Active Problem List   Diagnosis Date Noted  . (HFpEF) heart failure with preserved ejection fraction ([place])   . Renal calculus, right 07/03/2018  . Low back pain 06/27/2018  . Bacterial vaginitis 06/27/2018  . Pedal edema 06/27/2018  . Coronary artery disease, non-occlusive 04/10/2018  . Acute on chronic diastolic CHF (congestive heart failure) ([place]) 04/10/2018  . Encounter for other preprocedural examination 03/16/2018  . Community acquired pneumonia 02/16/2018  . Elevated brain natriuretic peptide (BNP) level 02/16/2018  . Abnormal renal function 02/16/2018  . Acute recurrent pansinusitis 01/13/2018  . Candidiasis 01/13/2018  . Anxiety 01/13/2018  . Osteoarthritis of knee 01/09/2018  . Morbid obesity with body mass index (BMI) of 40.0 or higher ([place]) 11/13/2017  . Major depressive disorder 09/22/2017  . Mitral valve stenosis   . Atrial fibrillation ([place]) 09/11/2017  . Generalized abdominal pain 09/08/2017  . Episode of recurrent major depressive disorder ([place]) 09/08/2017  . Hypertension 06/06/2017  . Cough 06/06/2017  . Tobacco dependency 06/06/2017  . Urinary tract infection without hematuria 04/28/2017  . Acute upper respiratory infection 04/28/2017  . Flu-like symptoms 04/28/2017  . Dysuria 04/28/2017  . Vaginal candidiasis 04/28/2017  . Pain of left lower extremity 02/11/2016  . S/P total knee replacement using  cement 12/25/2014  . Fibrocystic breast disease 03/23/2013  . Family history of breast cancer 07/20/2012    Past Surgical History:  Procedure Laterality Date  . ABDOMINAL HYSTERECTOMY     total  . ABDOMINAL HYSTERECTOMY    . BREAST BIOPSY Bilateral 2012  . BREAST BIOPSY Right 08-07-12   fibroadenomatous changes and columnar cells  . BREAST BIOPSY  02/03/2015   stereo byrnett  .  CHOLECYSTECTOMY N/A 02/27/2016   Procedure: LAPAROSCOPIC CHOLECYSTECTOMY;  Surgeon: Christene Lye, MD;  Location: [facility] ORS;  Service: General;  Laterality: N/A;  . CYSTOSCOPY W/ RETROGRADES Right 08/18/2018   Procedure: CYSTOSCOPY WITH RETROGRADE PYELOGRAM;  Surgeon: Billey Co, MD;  Location: [facility] ORS;  Service: Urology;  Laterality: Right;  . CYSTOSCOPY/URETEROSCOPY/HOLMIUM LASER/STENT PLACEMENT Right 08/18/2018   Procedure: CYSTOSCOPY/URETEROSCOPY/STENT PLACEMENT;  Surgeon: Billey Co, MD;  Location: [facility] ORS;  Service: Urology;  Laterality: Right;  . JOINT REPLACEMENT Left   . KNEE CLOSED REDUCTION Left 04/15/2015   Procedure: CLOSED MANIPULATION KNEE;  Surgeon: Thornton Park, MD;  Location: [facility] ORS;  Service: Orthopedics;  Laterality: Left;  . KNEE SURGERY    . TEE WITHOUT CARDIOVERSION N/A 09/13/2017   Procedure: TRANSESOPHAGEAL ECHOCARDIOGRAM (TEE);  Surgeon: Nelva Bush, MD;  Location: [facility] ORS;  Service: Cardiovascular;  Laterality: N/A;  . TOTAL KNEE ARTHROPLASTY Left 12/25/2014   Procedure: TOTAL KNEE ARTHROPLASTY;  Surgeon: Thornton Park, MD;  Location: [facility] ORS;  Service: Orthopedics;  Laterality: Left;  . TUBAL LIGATION      Prior to Admission medications   Medication Sig Start Date End Date Taking? Authorizing Provider  albuterol (VENTOLIN HFA) 108 (90 Base) MCG/ACT inhaler Inhale 2 puffs into the lungs every 4 (four) hours as needed for wheezing or shortness of breath. 06/16/18  Yes Boscia, Greer Ee, NP  ALPRAZolam (XANAX) 0.5 MG tablet Take 1 tablet (0.5 mg total) by mouth at bedtime as needed for anxiety. 06/19/18  Yes Ronnell Freshwater, NP  atorvastatin (LIPITOR) 40 MG tablet Take 1 tablet (40 mg total) by mouth daily at 6 PM. 02/28/18  Yes Theora Gianotti, NP  celecoxib (CELEBREX) 200 MG capsule Take 200 mg by mouth at bedtime as needed for mild pain.  06/19/18  Yes [provider]  diltiazem (CARDIZEM CD) 240 MG 24 hr capsule Take 1  capsule (240 mg total) by mouth daily. 03/23/18  Yes Gollan, Kathlene November, MD  estradiol (ESTRACE) 0.5 MG tablet Take 1 tablet (0.5 mg total) by mouth daily. 08/04/18  Yes Boscia, Greer Ee, NP  furosemide (LASIX) 20 MG tablet Take 1 tablet (20 mg) by mouth once daily. You may take 1 extra tablet (20 mg) after lunch as needed for leg swelling Patient taking differently: Take 20-40 mg by mouth See admin instructions. Take 40 mg in the morning and 20 mg in the evening 05/11/18  Yes Gollan, Kathlene November, MD  metoprolol succinate (TOPROL-XL) 25 MG 24 hr tablet Take 1 tablet (25 mg total) by mouth daily. Take with or immediately following a meal. 06/15/18  Yes Gollan, Kathlene November, MD  montelukast (SINGULAIR) 10 MG tablet Take 1 tablet (10 mg total) by mouth at bedtime. 04/18/18  Yes Boscia, Greer Ee, NP  naproxen sodium (ALEVE) 220 MG tablet Take 440 mg by mouth daily as needed (pain).   Yes [provider]  omeprazole (PRILOSEC) 40 MG capsule Take 1 capsule (40 mg total) by mouth daily. 06/22/18  Yes Ronnell Freshwater, NP  potassium chloride (K-DUR) 10 MEQ tablet  Take 2 tablets (20 meq) by mouth once daily Patient taking differently: Take 20 mEq by mouth daily.  04/10/18  Yes Minna Merritts, MD  sulfamethoxazole-trimethoprim (BACTRIM DS) 800-160 MG tablet Take 1 tablet by mouth daily. 08/18/18  Yes Billey Co, MD  tamsulosin (FLOMAX) 0.4 MG CAPS capsule Take 0.4 mg by mouth every evening.    Yes [provider]  warfarin (COUMADIN) 2.5 MG tablet TAKE 1/2 TO 1 TABLET DAILY AS DIRECTED BY COUMADIN CLINIC. Patient taking differently: Take 1.25-2.5 mg by mouth See admin instructions. Take 2.5 mg at night on Mon, Wed, and Fri.  Take 1.25 mg at night on Sun, Tue, Thur, and Sat 04/07/18  Yes Gollan, Kathlene November, MD  chlorpheniramine-HYDROcodone [facility] PENNKINETIC ER) 10-8 MG/5ML SUER Take 5 mLs by mouth every 12 (twelve) hours as needed for cough. Patient not taking: Reported on 08/26/2018 07/03/18    Ronnell Freshwater, NP    Allergies Morphine, Oxycodone hcl, and Dilaudid [hydromorphone hcl]  Family History  Problem Relation Age of Onset  . Cancer Mother 45       breast  . Cancer Maternal Aunt        breast  . Cancer Maternal Grandmother        breast    Social History Social History   Tobacco Use  . Smoking status: Former Smoker    Packs/day: 0.50    Years: 20.00    Pack years: 10.00    Types: Cigarettes    Quit date: 02/20/2018    Years since quitting: 0.5  . Smokeless tobacco: Never Used  . Tobacco comment: down to 2-3 daily  Substance Use Topics  . Alcohol use: Yes    Alcohol/week: 2.0 standard drinks    Types: 2 Glasses of wine per week    Comment: wine occ  . Drug use: No    Review of Systems  Review of Systems  Constitutional: Positive for appetite change and fatigue. Negative for fever.  HENT: Negative for congestion and sore throat.   Eyes: Negative for visual disturbance.  Respiratory: Negative for cough and shortness of breath.   Cardiovascular: Negative for chest pain.  Gastrointestinal: Positive for nausea. Negative for abdominal pain, diarrhea and vomiting.  Genitourinary: Positive for dysuria, flank pain and frequency.  Musculoskeletal: Negative for back pain and neck pain.  Skin: Negative for rash and wound.  Neurological: Positive for weakness.  All other systems reviewed and are negative.    ____________________________________________  PHYSICAL EXAM:      VITAL SIGNS: ED Triage Vitals  Enc Vitals Group     BP 08/26/18 1158 140/78     Pulse Rate 08/26/18 1158 (!) 110     Resp 08/26/18 1158 15     Temp 08/26/18 1158 99.1 F (37.3 C)     Temp Source 08/26/18 1158 Oral     SpO2 08/26/18 1158 90 %     Weight 08/26/18 1226 223 lb (101.2 kg)     Height 08/26/18 1226 '5\' 4"'  (1.626 m)     Head Circumference --      Peak Flow --      Pain Score 08/26/18 1226 5     Pain Loc --      Pain Edu? --      Excl. in [place]? --      Physical  Exam Vitals signs and nursing note reviewed.  Constitutional:      General: She is not in acute distress.    Appearance:  She is well-developed.  HENT:     Head: Normocephalic and atraumatic.     Mouth/Throat:     Mouth: Mucous membranes are dry.  Eyes:     Conjunctiva/sclera: Conjunctivae normal.  Neck:     Musculoskeletal: Neck supple.  Cardiovascular:     Rate and Rhythm: Regular rhythm. Tachycardia present.     Heart sounds: Normal heart sounds. No murmur. No friction rub.  Pulmonary:     Effort: Pulmonary effort is normal. Tachypnea present. No respiratory distress.     Breath sounds: Normal breath sounds. No wheezing or rales.  Abdominal:     General: There is no distension.     Palpations: Abdomen is soft.     Tenderness: There is abdominal tenderness in the suprapubic area. There is right CVA tenderness. There is no left CVA tenderness.  Skin:    General: Skin is warm.     Capillary Refill: Capillary refill takes less than 2 seconds.  Neurological:     Mental Status: She is alert and oriented to person, place, and time.     Motor: No abnormal muscle tone.       ____________________________________________   LABS (all labs ordered are listed, but only abnormal results are displayed)  Labs Reviewed  URINALYSIS, COMPLETE (UACMP) WITH MICROSCOPIC - Abnormal; Notable for the following components:      Result Value   Color, Urine AMBER (*)    APPearance CLOUDY (*)    Hgb urine dipstick MODERATE (*)    Protein, ur 100 (*)    Leukocytes,Ua LARGE (*)    WBC, UA >50 (*)    Bacteria, UA FEW (*)    All other components within normal limits  BASIC METABOLIC PANEL - Abnormal; Notable for the following components:   Sodium 134 (*)    CO2 21 (*)    Glucose, Bld 144 (*)    BUN 30 (*)    Creatinine, Ser 2.13 (*)    Calcium 8.2 (*)    GFR calc non Af Amer 26 (*)    GFR calc Af Amer 30 (*)    All other components within normal limits  CBC - Abnormal; Notable for the  following components:   WBC 16.6 (*)    RBC 3.08 (*)    Hemoglobin 8.2 (*)    HCT 25.6 (*)    RDW 16.5 (*)    All other components within normal limits  CULTURE, BLOOD (ROUTINE X 2)  CULTURE, BLOOD (ROUTINE X 2)  URINE CULTURE  SARS CORONAVIRUS 2 (HOSPITAL ORDER, [facility])  LACTIC ACID, PLASMA  LACTIC ACID, PLASMA    ____________________________________________  EKG: Sinus tachycardia, VR 111. PR 152, QRS 88, QTc 44. No acute St-t segment changes. Rate increased from prior. No ischemic changes. ________________________________________  RADIOLOGY All imaging, including plain films, CT scans, and ultrasounds, independently reviewed by me, and interpretations confirmed via formal radiology reads.  ED MD interpretation:   CXR: Atpical infection vs edema CT: R perinephric stranding, ureteral dilation, likely pyelo vs post-stenting inflammation  Official radiology report(s): Dg Chest 2 View  Result Date: 08/26/2018 CLINICAL DATA:  Former smoker. Shortness of breath. History of interstitial lung disease. EXAM: CHEST - 2 VIEW COMPARISON:  March 16, 2018 FINDINGS: No pneumothorax. Increasing bilateral pulmonary opacities, particularly in the bases, worsened in the interval. The heart size borderline. The hila and mediastinum are unremarkable. IMPRESSION: The findings may represent atypical infection versus pulmonary edema. Recommend clinical correlation. Electronically Signed  By: Dorise Bullion III M.D   On: 08/26/2018 15:51   Ct Renal Stone Study  Result Date: 08/26/2018 CLINICAL DATA:  Flank pain question recurrent stone disease EXAM: CT ABDOMEN AND PELVIS WITHOUT CONTRAST TECHNIQUE: Multidetector CT imaging of the abdomen and pelvis was performed following the standard protocol without IV contrast. Sagittal and coronal MPR images reconstructed from axial data set. Oral contrast not administered for this indication COMPARISON:  07/18/2018 FINDINGS: Lower  chest: Small BILATERAL pleural effusions and mild bibasilar atelectasis Hepatobiliary: Gallbladder surgically absent.  Liver unremarkable. Pancreas: Normal appearance Spleen: Normal appearance Adrenals/Urinary Tract: Adrenal glands normal appearance. LEFT kidney normal appearance. RIGHT kidney enlarged with diffuse perinephric edema. Mild dilatation of RIGHT renal collecting system and RIGHT ureter. Bladder unremarkable. No ureteral calcification identified. Again seen 8 mm calculus at mid RIGHT kidney, nonobstructing. Again seen cyst at upper pole of RIGHT kidney, 3.0 x 2.9 cm, stable. Stomach/Bowel: Normal appendix. Stomach and bowel loops normal appearance Vascular/Lymphatic: Atherosclerotic calcifications aorta and iliac arteries without aneurysm. No adenopathy. Reproductive: Uterus surgically absent with nonvisualization of ovaries Other: No free air or free fluid.  No hernia. Musculoskeletal: No acute osseous findings. IMPRESSION: Enlargement of RIGHT kidney and perinephric edema with dilatation of RIGHT renal collecting system and RIGHT ureter. No obstructing ureteral calculus identified. Findings are most suspicious for a passed calculus though infection/pyelonephritis not completely excluded; recommend correlation with urinalysis. Electronically Signed   By: Lavonia Dana M.D.   On: 08/26/2018 15:35    ____________________________________________  PROCEDURES   Procedure(s) performed (including Critical Care):  Procedures  ____________________________________________  INITIAL IMPRESSION / MDM / [place] / ED COURSE  As part of my medical decision making, I reviewed the following data within the electronic MEDICAL RECORD NUMBER Notes from prior ED visits and [place] Controlled Substance Database      *Cassi Jenne was evaluated in Emergency Department on 08/26/2018 for the symptoms described in the history of present illness. She was evaluated in the context of the global COVID-19 pandemic,  which necessitated consideration that the patient might be at risk for infection with the SARS-CoV-2 virus that causes COVID-19. Institutional protocols and algorithms that pertain to the evaluation of patients at risk for COVID-19 are in a state of rapid change based on information released by regulatory bodies including the CDC and federal and state organizations. These policies and algorithms were followed during the patient's care in the ED.  Some ED evaluations and interventions may be delayed as a result of limited staffing during the pandemic.*   Clinical Course as of Aug 26 1723  Sat Aug 26, 2018  1534 53 yo F s/p recent ureteral stenting here with ongoing nv, fevers, and flank pain. Concern for pyelonephritis in setting of recent stenting, which is fortunately now removed. CT pending. Labs show leukocytosis, significant AKI - IVF, Rocephin started. UA/UCx sent. CT stone pending. Likely admit for ongoing n/v, AKI, possible pyelo.   [CI]  1548 CT stone is c/w likely pyelo vs post-stent inflammation. Admit for IVF, management. No signs of recurrent hydro or new obstruction.   [CI]    Clinical Course User Index [CI] Duffy Bruce, MD    Medical Decision Making: As above. Admit for pyelo. Rocephin given, IV ABX ordered.   ____________________________________________  FINAL CLINICAL IMPRESSION(S) / ED DIAGNOSES  Final diagnoses:  Pyelonephritis  AKI (acute kidney injury) ([place])     MEDICATIONS GIVEN DURING THIS VISIT:  Medications  sodium chloride 0.9 % bolus 1,000 mL (has  no administration in time range)  HYDROmorphone (DILAUDID) injection 1 mg (has no administration in time range)  cefTRIAXone (ROCEPHIN) 2 g in sodium chloride 0.9 % 100 mL IVPB (2 g Intravenous New Bag/Given 08/26/18 1514)  sodium chloride 0.9 % bolus 1,000 mL (1,000 mLs Intravenous New Bag/Given 08/26/18 1502)  HYDROmorphone (DILAUDID) injection 1 mg (1 mg Intravenous Given 08/26/18 1500)  ondansetron (ZOFRAN)  injection 4 mg (4 mg Intravenous Given 08/26/18 1500)     ED Discharge Orders    None       Note:  This document was prepared using Dragon voice recognition software and may include unintentional dictation errors.   Duffy Bruce, MD 08/26/18 1725

## 2018-08-27 LAB — CBC
HCT: 22.4 % — ABNORMAL LOW (ref 36.0–46.0)
Hemoglobin: 6.9 g/dL — ABNORMAL LOW (ref 12.0–15.0)
MCH: 26.3 pg (ref 26.0–34.0)
MCHC: 30.8 g/dL (ref 30.0–36.0)
MCV: 85.5 fL (ref 80.0–100.0)
Platelets: 239 10*3/uL (ref 150–400)
RBC: 2.62 MIL/uL — ABNORMAL LOW (ref 3.87–5.11)
RDW: 16.9 % — ABNORMAL HIGH (ref 11.5–15.5)
WBC: 16.8 10*3/uL — ABNORMAL HIGH (ref 4.0–10.5)
nRBC: 0.3 % — ABNORMAL HIGH (ref 0.0–0.2)

## 2018-08-27 LAB — BASIC METABOLIC PANEL
Anion gap: 10 (ref 5–15)
BUN: 42 mg/dL — ABNORMAL HIGH (ref 6–20)
CO2: 21 mmol/L — ABNORMAL LOW (ref 22–32)
Calcium: 8.1 mg/dL — ABNORMAL LOW (ref 8.9–10.3)
Chloride: 104 mmol/L (ref 98–111)
Creatinine, Ser: 3.24 mg/dL — ABNORMAL HIGH (ref 0.44–1.00)
GFR calc Af Amer: 18 mL/min — ABNORMAL LOW (ref >60)
GFR calc non Af Amer: 16 mL/min — ABNORMAL LOW (ref >60)
Glucose, Bld: 108 mg/dL — ABNORMAL HIGH (ref 70–99)
Potassium: 5.6 mmol/L — ABNORMAL HIGH (ref 3.5–5.1)
Sodium: 135 mmol/L (ref 135–145)

## 2018-08-27 LAB — PROTIME-INR
INR: 4.5 (ref 0.8–1.2)
Prothrombin Time: 41.8 seconds — ABNORMAL HIGH (ref 11.4–15.2)

## 2018-08-27 LAB — PREPARE RBC (CROSSMATCH)

## 2018-08-27 MED ORDER — SODIUM ZIRCONIUM CYCLOSILICATE 5 G PO PACK
5.0000 g | PACK | Freq: Every day | ORAL | Status: DC
  Administered 2018-08-27: 5 g via ORAL
  Filled 2018-08-27: qty 1

## 2018-08-27 MED ORDER — SODIUM CHLORIDE 0.9% IV SOLUTION
Freq: Once | INTRAVENOUS | Status: AC
  Administered 2018-08-27: 13:00:00 via INTRAVENOUS

## 2018-08-27 MED ORDER — PATIROMER SORBITEX CALCIUM 8.4 G PO PACK
16.8000 g | PACK | Freq: Every day | ORAL | Status: DC
  Administered 2018-08-27 – 2018-08-31 (×5): 16.8 g via ORAL
  Filled 2018-08-27 (×7): qty 2

## 2018-08-27 MED ORDER — POLYETHYLENE GLYCOL 3350 17 G PO PACK
17.0000 g | PACK | Freq: Every day | ORAL | Status: DC
  Administered 2018-08-27 – 2018-08-28 (×2): 17 g via ORAL
  Filled 2018-08-27 (×2): qty 1

## 2018-08-27 MED ORDER — HYDROCODONE-ACETAMINOPHEN 5-325 MG PO TABS
1.0000 | ORAL_TABLET | Freq: Four times a day (QID) | ORAL | Status: DC | PRN
  Administered 2018-08-27 – 2018-09-01 (×7): 1 via ORAL
  Filled 2018-08-27 (×9): qty 1

## 2018-08-27 MED ORDER — SODIUM CHLORIDE 0.9 % IV SOLN
INTRAVENOUS | Status: AC
  Administered 2018-08-27: 08:00:00 via INTRAVENOUS

## 2018-08-27 MED ORDER — ACETAMINOPHEN 325 MG PO TABS: 650.0000 mg | ORAL_TABLET | Freq: Once | ORAL | Status: DC

## 2018-08-27 NOTE — Consult Note (Signed)
[address] [place], [place] [address] Phone [phone]. Fax [phone]  Date: 08/27/2018                  Patient Name:  Leslie [name]  MRN: [id number]  DOB: 06-Feb-1965  Age / Sex: 53 y.o., female         PCP: Ronnell Freshwater, NP                 Service Requesting Consult: IM/ Demetrios Loll, MD                 Reason for Consult: AKI            History of Present Illness: Patient is a 53 y.o. female with medical problems of CHF,Interstitial lung disease, HTN, A fib , who was admitted to [facility] on 08/26/2018 for evaluation of abdominal pain.  Diagnosed with sepsis due to acute pyelonephritis and ARF  Results for Leslie [name], Leslie [name] (MRN [id number]) as of 08/27/2018 07:52  08/15/2018 16:02 08/26/2018 12:28 08/26/2018 20:24 08/27/2018 04:20  Creatinine 1.25 (H) 2.13 (H)  3.24 (H)   Potassium elevated 5.6 Patient was diagnosed with 1 cm right kidney stone.  Underwent cystoscopy, right ureteral stent placement.  Patient removed the stent at home this past Monday Since then she has been feeling sick.  Decreased oral intake.  No energy.  She is now admitted for further evaluation and management.  Her urinalysis shows moderate blood, large leukocytes, some protein, 21-50 RBCs and greater than 50 WBCs  Medications: Outpatient medications: Medications Prior to Admission  Medication Sig Dispense Refill Last Dose  . albuterol (VENTOLIN HFA) 108 (90 Base) MCG/ACT inhaler Inhale 2 puffs into the lungs every 4 (four) hours as needed for wheezing or shortness of breath. 3 Inhaler 0 Unknown at PRN  . ALPRAZolam (XANAX) 0.5 MG tablet Take 1 tablet (0.5 mg total) by mouth at bedtime as needed for anxiety. 30 tablet 2 Unknown at PRN  . atorvastatin (LIPITOR) 40 MG tablet Take 1 tablet (40 mg total) by mouth daily at 6 PM. 90 tablet 3 08/25/2018 at 1800  . celecoxib (CELEBREX) 200 MG capsule Take 200 mg by mouth at bedtime as needed for mild pain.    Unknown at PRN  . diltiazem (CARDIZEM CD) 240 MG 24  hr capsule Take 1 capsule (240 mg total) by mouth daily. 90 capsule 1 08/26/2018 at 0730  . estradiol (ESTRACE) 0.5 MG tablet Take 1 tablet (0.5 mg total) by mouth daily. 90 tablet 1 08/26/2018 at 0730  . furosemide (LASIX) 20 MG tablet Take 1 tablet (20 mg) by mouth once daily. You may take 1 extra tablet (20 mg) after lunch as needed for leg swelling (Patient taking differently: Take 20-40 mg by mouth See admin instructions. Take 40 mg in the morning and 20 mg in the evening) 135 tablet 2 08/26/2018 at 0730  . metoprolol succinate (TOPROL-XL) 25 MG 24 hr tablet Take 1 tablet (25 mg total) by mouth daily. Take with or immediately following a meal. 90 tablet 0 08/26/2018 at 0730  . montelukast (SINGULAIR) 10 MG tablet Take 1 tablet (10 mg total) by mouth at bedtime. 90 tablet 2 08/25/2018 at 2100  . naproxen sodium (ALEVE) 220 MG tablet Take 440 mg by mouth daily as needed (pain).   Unknown at PRN  . omeprazole (PRILOSEC) 40 MG capsule Take 1 capsule (40 mg total) by mouth daily. 90 capsule 1 08/26/2018 at 0730  . potassium chloride (K-DUR) 10 MEQ  tablet Take 2 tablets (20 meq) by mouth once daily (Patient taking differently: Take 20 mEq by mouth daily. ) 180 tablet 2 08/25/2018 at 0700  . sulfamethoxazole-trimethoprim (BACTRIM DS) 800-160 MG tablet Take 1 tablet by mouth daily. 4 tablet 0 08/25/2018 at 1800  . tamsulosin (FLOMAX) 0.4 MG CAPS capsule Take 0.4 mg by mouth every evening.    08/25/2018 at 1800  . warfarin (COUMADIN) 2.5 MG tablet TAKE 1/2 TO 1 TABLET DAILY AS DIRECTED BY COUMADIN CLINIC. (Patient taking differently: Take 1.25-2.5 mg by mouth See admin instructions. Take 2.5 mg at night on Mon, Wed, and Fri.  Take 1.25 mg at night on Sun, Tue, Thur, and Sat) 90 tablet 1 08/25/2018 at 2100  . chlorpheniramine-HYDROcodone (TUSSIONEX PENNKINETIC ER) 10-8 MG/5ML SUER Take 5 mLs by mouth every 12 (twelve) hours as needed for cough. (Patient not taking: Reported on 08/26/2018) 115 mL 0 Not Taking at Unknown time     Current medications: Current Facility-Administered Medications  Medication Dose Route Frequency Provider Last Rate Last Dose  . 0.9 %  sodium chloride infusion (Manually program via Guardrails IV Fluids)   Intravenous Once Demetrios Loll, MD      . 0.9 %  sodium chloride infusion   Intravenous Continuous Demetrios Loll, MD      . acetaminophen (TYLENOL) tablet 650 mg  650 mg Oral Q6H PRN Gladstone Lighter, MD   650 mg at 08/27/18 0153   Or  . acetaminophen (TYLENOL) suppository 650 mg  650 mg Rectal Q6H PRN Gladstone Lighter, MD      . acetaminophen (TYLENOL) tablet 650 mg  650 mg Oral Once Demetrios Loll, MD      . ALPRAZolam Duanne Moron) tablet 0.5 mg  0.5 mg Oral QHS PRN Gladstone Lighter, MD   0.5 mg at 08/27/18 0153  . atorvastatin (LIPITOR) tablet 40 mg  40 mg Oral q1800 Gladstone Lighter, MD      . diltiazem (CARDIZEM CD) 24 hr capsule 240 mg  240 mg Oral Daily Gladstone Lighter, MD      . docusate sodium (COLACE) capsule 100 mg  100 mg Oral BID Gladstone Lighter, MD   100 mg at 08/26/18 2218  . ipratropium-albuterol (DUONEB) 0.5-2.5 (3) MG/3ML nebulizer solution 3 mL  3 mL Nebulization Q6H PRN Gladstone Lighter, MD      . meropenem (MERREM) 1 g in sodium chloride 0.9 % 100 mL IVPB  1 g Intravenous Q12H Gladstone Lighter, MD   Stopped at 08/26/18 2248  . metoprolol succinate (TOPROL-XL) 24 hr tablet 25 mg  25 mg Oral Daily Gladstone Lighter, MD      . montelukast (SINGULAIR) tablet 10 mg  10 mg Oral QHS Gladstone Lighter, MD   10 mg at 08/26/18 2218  . ondansetron (ZOFRAN) tablet 4 mg  4 mg Oral Q6H PRN Gladstone Lighter, MD       Or  . ondansetron (ZOFRAN) injection 4 mg  4 mg Intravenous Q6H PRN Gladstone Lighter, MD      . pantoprazole (PROTONIX) EC tablet 40 mg  40 mg Oral Daily Tressia Miners, Radhika, MD      . polyethylene glycol (MIRALAX / GLYCOLAX) packet 17 g  17 g Oral Daily PRN Gladstone Lighter, MD      . sodium zirconium cyclosilicate (LOKELMA) packet 5 g  5 g Oral Daily Demetrios Loll, MD      . tamsulosin (FLOMAX) capsule 0.4 mg  0.4 mg Oral QPM Gladstone Lighter, MD      .  warfarin (COUMADIN) tablet 1.25 mg  1.25 mg Oral Q T,Th,S,Su-1800 Gladstone Lighter, MD   1.25 mg at 08/26/18 2218  . [START ON 08/28/2018] warfarin (COUMADIN) tablet 2.5 mg  2.5 mg Oral Q M,W,F-1800 Gladstone Lighter, MD      . Warfarin - Pharmacist Dosing Inpatient   Does not apply [id number] Gladstone Lighter, MD          Allergies: Allergies  Allergen Reactions  . Morphine Hives and Rash  . Oxycodone Hcl Hives and Itching  . Dilaudid [Hydromorphone Hcl] Itching      Past Medical History: Past Medical History:  Diagnosis Date  . (HFpEF) heart failure with preserved ejection fraction ([place])    a. 08/2017 Echo: EF 55-60%.  Grade 2 diastolic dysfunction.  Moderate mitral stenosis.  . Allergy   . Anemia   . Anxiety   . BRCA negative 03/22/2013  . Bronchitis 02/19/2016   ON LEVAQUIN PO  . Chest tightness   . CHF (congestive heart failure) ([place])   . Cigarette smoker 09/11/2017   8-10 day  . GERD (gastroesophageal reflux disease)   . History of kidney stones   . Hyperlipidemia   . Hypertension   . Interstitial lung disease ([place])    a. CT 2013 b. 02/2018 CXR noted recurrent intersistial changes ILD vs chronic bronchitis  . Moderate mitral stenosis    a.  08/2017 TEE: EF 60 to 65%.  Moderate mitral stenosis.  Mean gradient 14 mmHg.  Valve area 2.59 cm by planimetry, 2.72 cm by pressure half-time.  . Palpitations   . Persistent atrial fibrillation    a. 08/2017 s/p TEE/DCCV; b. CHA2DS2VASc = 2-->warfarin.     Past Surgical History: Past Surgical History:  Procedure Laterality Date  . ABDOMINAL HYSTERECTOMY     total  . ABDOMINAL HYSTERECTOMY    . BREAST BIOPSY Bilateral 2012  . BREAST BIOPSY Right 08-07-12   fibroadenomatous changes and columnar cells  . BREAST BIOPSY  02/03/2015   stereo byrnett  . CHOLECYSTECTOMY N/A 02/27/2016   Procedure: LAPAROSCOPIC CHOLECYSTECTOMY;   Surgeon: Christene Lye, MD;  Location: [facility] ORS;  Service: General;  Laterality: N/A;  . CYSTOSCOPY W/ RETROGRADES Right 08/18/2018   Procedure: CYSTOSCOPY WITH RETROGRADE PYELOGRAM;  Surgeon: Billey Co, MD;  Location: [facility] ORS;  Service: Urology;  Laterality: Right;  . CYSTOSCOPY/URETEROSCOPY/HOLMIUM LASER/STENT PLACEMENT Right 08/18/2018   Procedure: CYSTOSCOPY/URETEROSCOPY/STENT PLACEMENT;  Surgeon: Billey Co, MD;  Location: [facility] ORS;  Service: Urology;  Laterality: Right;  . JOINT REPLACEMENT Left   . KNEE CLOSED REDUCTION Left 04/15/2015   Procedure: CLOSED MANIPULATION KNEE;  Surgeon: Thornton Park, MD;  Location: [facility] ORS;  Service: Orthopedics;  Laterality: Left;  . KNEE SURGERY    . TEE WITHOUT CARDIOVERSION N/A 09/13/2017   Procedure: TRANSESOPHAGEAL ECHOCARDIOGRAM (TEE);  Surgeon: Nelva Bush, MD;  Location: [facility] ORS;  Service: Cardiovascular;  Laterality: N/A;  . TOTAL KNEE ARTHROPLASTY Left 12/25/2014   Procedure: TOTAL KNEE ARTHROPLASTY;  Surgeon: Thornton Park, MD;  Location: [facility] OR[facility];  Service: Orthopedics;  Laterality: Left;  . TUBAL LIGATION       Family History: Family History  Problem Relation Age of Onset  . Cancer Mother 33       breast  . Cancer Maternal Aunt        breast  . Cancer Maternal Grandmother        breast     Social History: Social History   Socioeconomic History  . Marital status: Married  Spouse name: Not on file  . Number of children: Not on file  . Years of education: Not on file  . Highest education level: Not on file  Occupational History  . Not on file  Social Needs  . Financial resource strain: Not on file  . Food insecurity    Worry: Not on file    Inability: Not on file  . Transportation needs    Medical: Not on file    Non-medical: Not on file  Tobacco Use  . Smoking status: Former Smoker    Packs/day: 0.50    Years: 20.00    Pack years: 10.00    Types: Cigarettes    Quit date: 02/20/2018     Years since quitting: 0.5  . Smokeless tobacco: Never Used  . Tobacco comment: down to 2-3 daily  Substance and Sexual Activity  . Alcohol use: Yes    Alcohol/week: 2.0 standard drinks    Types: 2 Glasses of wine per week    Comment: wine occ  . Drug use: No  . Sexual activity: Not on file  Lifestyle  . Physical activity    Days per week: Not on file    Minutes per session: Not on file  . Stress: Not on file  Relationships  . Social Herbalist on phone: Not on file    Gets together: Not on file    Attends religious service: Not on file    Active member of club or organization: Not on file    Attends meetings of clubs or organizations: Not on file    Relationship status: Not on file  . Intimate partner violence    Fear of current or ex partner: Not on file    Emotionally abused: Not on file    Physically abused: Not on file    Forced sexual activity: Not on file  Other Topics Concern  . Not on file  Social History Narrative  . Not on file     Review of Systems: Gen: Poor appetite, generalized weakness HEENT: Denies any acute complaints CV: History of atrial fibrillation, irregular heart rate, palpitations Resp: Some shortness of breath with exertion GT:XMIWOEHO is poor, no nausea or vomiting.  No blood in the stool GU : Admitted for pyelonephritis, recent right kidney stone MS: No complaint Derm:   No complaint Psych:No complaints Heme: No complaints Neuro: No complaint Endocrine.  No complaint  Vital Signs: Blood pressure 137/70, pulse 98, temperature 97.8 F (36.6 C), temperature source Oral, resp. rate 20, height '5\' 4"'  (1.626 m), weight 117.1 kg, SpO2 96 %.   Intake/Output Summary (Last 24 hours) at 08/27/2018 0750 Last data filed at 08/27/2018 0554 Gross per 24 hour  Intake 100 ml  Output 400 ml  Net -300 ml    Weight trends: Filed Weights   08/26/18 1226 08/26/18 2008  Weight: 101.2 kg 117.1 kg    Physical Exam: General:  No acute  distress,  HEENT Anicteric, moist oral mucous membranes  Neck:  Supple, no masses  Lungs: Mild basilar crackles, Normal breathing effort  Heart::  Regular with ectopic beats  Abdomen: Soft, mildly distended  Extremities:  Trace edema  Neurologic: Alert, oriented  Skin: No acute rashes    Lab results: Basic Metabolic Panel: Recent Labs  Lab 08/26/18 1228 08/27/18 0420  NA 134* 135  K 4.0 5.6*  CL 101 104  CO2 21* 21*  GLUCOSE 144* 108*  BUN 30* 42*  CREATININE 2.13* 3.24*  CALCIUM 8.2* 8.1*    Liver Function Tests: No results for input(s): AST, ALT, ALKPHOS, BILITOT, PROT, ALBUMIN in the last 168 hours. No results for input(s): LIPASE, AMYLASE in the last 168 hours. No results for input(s): AMMONIA in the last 168 hours.  CBC: Recent Labs  Lab 08/26/18 1228 08/27/18 0420  WBC 16.6* 16.8*  HGB 8.2* 6.9*  HCT 25.6* 22.4*  MCV 83.1 85.5  PLT 283 239    Cardiac Enzymes: No results for input(s): CKTOTAL, TROPONINI in the last 168 hours.  BNP: Invalid input(s): POCBNP  CBG: No results for input(s): GLUCAP in the last 168 hours.  Microbiology: Recent Results (from the past 720 hour(s))  CULTURE, URINE COMPREHENSIVE     Status: None   Collection Time: 08/17/18  2:06 PM   Specimen: Urine   UR  Result Value Ref Range Status   Urine Culture, Comprehensive Final report  Final   Organism ID, Bacteria Comment  Final    Comment: Mixed urogenital flora 3,000 Colonies/mL   Microscopic Examination     Status: None   Collection Time: 08/17/18  2:06 PM   URINE  Result Value Ref Range Status   WBC, UA 0-5 0 - 5 /hpf Final   RBC None seen 0 - 2 /hpf Final   Epithelial Cells (non renal) 0-10 0 - 10 /hpf Final   Bacteria, UA Few None seen/Few Final  SARS Coronavirus 2 (Performed in [place] hospital lab)     Status: None   Collection Time: 08/17/18  2:25 PM   Specimen: Nasal Swab  Result Value Ref Range Status   SARS Coronavirus 2 NEGATIVE NEGATIVE Final     Comment: (NOTE) SARS-CoV-2 target nucleic acids are NOT DETECTED. The SARS-CoV-2 RNA is generally detectable in upper and lower respiratory specimens during the acute phase of infection. Negative results do not preclude SARS-CoV-2 infection, do not rule out co-infections with other pathogens, and should not be used as the sole basis for treatment or other patient management decisions. Negative results must be combined with clinical observations, patient history, and epidemiological information. The expected result is Negative. Fact Sheet for Patients: SugarRoll.be Fact Sheet for Healthcare Providers: https://www.woods-mathews.com/ This test is not yet approved or cleared by the [place] FDA and  has been authorized for detection and/or diagnosis of SARS-CoV-2 by FDA under an Emergency Use Authorization (EUA). This EUA will remain  in effect (meaning this test can be used) for the duration of the COVID-19 declaration under Section 56 4(b)(1) of the Act, 21 U.S.C. section 360bbb-3(b)(1), unless the authorization is terminated or revoked sooner. Performed at [facility], [place] [address], [place], [place] [address]   Blood culture (routine x 2)     Status: None (Preliminary result)   Collection Time: 08/26/18  3:05 PM   Specimen: BLOOD  Result Value Ref Range Status   Specimen Description BLOOD RIGHT ANTECUBITAL  Final   Special Requests   Final    BOTTLES DRAWN AEROBIC AND ANAEROBIC Blood Culture adequate volume   Culture   Final    NO GROWTH < 24 HOURS Performed at [facility], [address] [place], [place] [address]    Report Status PENDING  Incomplete  Blood culture (routine x 2)     Status: None (Preliminary result)   Collection Time: 08/26/18  3:05 PM   Specimen: BLOOD  Result Value Ref Range Status   Specimen Description BLOOD BLOOD LEFT HAND  Final   Special Requests  Final    BOTTLES DRAWN AEROBIC AND  ANAEROBIC Blood Culture adequate volume   Culture   Final    NO GROWTH < 24 HOURS Performed at [facility], [address] [place], [place] [address]    Report Status PENDING  Incomplete  SARS Coronavirus 2 [facility] order, Performed in [facility] hospital lab) Nasopharyngeal Nasopharyngeal Swab     Status: None   Collection Time: 08/26/18  4:34 PM   Specimen: Nasopharyngeal Swab  Result Value Ref Range Status   SARS Coronavirus 2 NEGATIVE NEGATIVE Final    Comment: (NOTE) If result is NEGATIVE SARS-CoV-2 target nucleic acids are NOT DETECTED. The SARS-CoV-2 RNA is generally detectable in upper and lower  respiratory specimens during the acute phase of infection. The lowest  concentration of SARS-CoV-2 viral copies this assay can detect is 250  copies / mL. A negative result does not preclude SARS-CoV-2 infection  and should not be used as the sole basis for treatment or other  patient management decisions.  A negative result may occur with  improper specimen collection / handling, submission of specimen other  than nasopharyngeal swab, presence of viral mutation(s) within the  areas targeted by this assay, and inadequate number of viral copies  (<250 copies / mL). A negative result must be combined with clinical  observations, patient history, and epidemiological information. If result is POSITIVE SARS-CoV-2 target nucleic acids are DETECTED. The SARS-CoV-2 RNA is generally detectable in upper and lower  respiratory specimens dur ing the acute phase of infection.  Positive  results are indicative of active infection with SARS-CoV-2.  Clinical  correlation with patient history and other diagnostic information is  necessary to determine patient infection status.  Positive results do  not rule out bacterial infection or co-infection with other viruses. If result is PRESUMPTIVE POSTIVE SARS-CoV-2 nucleic acids MAY BE PRESENT.   A presumptive positive result was obtained  on the submitted specimen  and confirmed on repeat testing.  While 2019 novel coronavirus  (SARS-CoV-2) nucleic acids may be present in the submitted sample  additional confirmatory testing may be necessary for epidemiological  and / or clinical management purposes  to differentiate between  SARS-CoV-2 and other Sarbecovirus currently known to infect humans.  If clinically indicated additional testing with an alternate test  methodology [phone]) is advised. The SARS-CoV-2 RNA is generally  detectable in upper and lower respiratory sp ecimens during the acute  phase of infection. The expected result is Negative. Fact Sheet for Patients:  StrictlyIdeas.no Fact Sheet for Healthcare Providers: BankingDealers.co.za This test is not yet approved or cleared by the [place] FDA and has been authorized for detection and/or diagnosis of SARS-CoV-2 by FDA under an Emergency Use Authorization (EUA).  This EUA will remain in effect (meaning this test can be used) for the duration of the COVID-19 declaration under Section 564(b)(1) of the Act, 21 U.S.C. section 360bbb-3(b)(1), unless the authorization is terminated or revoked sooner. Performed at [facility], [address] [place], [place] [address]      Coagulation Studies: Recent Labs    08/26/18 1221 08/27/18 0420  LABPROT 27.2* 41.8*  INR 2.6* 4.5*    Urinalysis: Recent Labs    08/26/18 1632  COLORURINE AMBER*  LABSPEC 1.011  PHURINE 5.0  GLUCOSEU NEGATIVE  HGBUR MODERATE*  BILIRUBINUR NEGATIVE  KETONESUR NEGATIVE  PROTEINUR 100*  NITRITE NEGATIVE  LEUKOCYTESUR LARGE*        Imaging: Dg Chest 2 View  Result Date: 08/26/2018  CLINICAL DATA:  Former smoker. Shortness of breath. History of interstitial lung disease. EXAM: CHEST - 2 VIEW COMPARISON:  March 16, 2018 FINDINGS: No pneumothorax. Increasing bilateral pulmonary opacities, particularly in the  bases, worsened in the interval. The heart size borderline. The hila and mediastinum are unremarkable. IMPRESSION: The findings may represent atypical infection versus pulmonary edema. Recommend clinical correlation. Electronically Signed   By: Dorise Bullion III M.D   On: 08/26/2018 15:51   Ct Chest Wo Contrast  Result Date: 08/26/2018 CLINICAL DATA:  Shortness of breath EXAM: CT CHEST WITHOUT CONTRAST TECHNIQUE: Multidetector CT imaging of the chest was performed following the standard protocol without IV contrast. COMPARISON:  None. FINDINGS: Cardiovascular: The heart size is mildly enlarged. Aortic calcifications are noted. The main pulmonary artery is dilated measuring up to approximately 3.3 cm in diameter. There is no significant pericardial effusion. Mediastinum/Nodes: --there prominent mediastinal and hilar lymph nodes bilaterally. --No axillary lymphadenopathy. --No supraclavicular lymphadenopathy. --Normal thyroid gland. --The esophagus is unremarkable Lungs/Pleura: There are small bilateral pleural effusions. The lung volumes are low. There is a mosaic appearance of the lung fields bilaterally with scattered areas of ground-glass opacification. There are more focal opacities at the lung bases favored to represent atelectasis. There is interlobular septal thickening. There is no pneumothorax. Upper Abdomen: No acute abnormality. Musculoskeletal: No chest wall abnormality. No acute or significant osseous findings. Review of the MIP images confirms the above findings. IMPRESSION: 1. Overall findings concerning for developing pulmonary edema. An atypical infectious process is not entirely excluded and should be correlated clinically. 2. Small bilateral pleural effusions. 3. Mediastinal and hilar adenopathy, presumably reactive in etiology. 4. Low lung volumes. 5. Dilated main pulmonary artery which can be seen in patients with elevated PA pressures. Aortic Atherosclerosis (ICD10-I70.0). Electronically  Signed   By: Constance Holster M.D.   On: 08/26/2018 18:55   Ct Renal Stone Study  Result Date: 08/26/2018 CLINICAL DATA:  Flank pain question recurrent stone disease EXAM: CT ABDOMEN AND PELVIS WITHOUT CONTRAST TECHNIQUE: Multidetector CT imaging of the abdomen and pelvis was performed following the standard protocol without IV contrast. Sagittal and coronal MPR images reconstructed from axial data set. Oral contrast not administered for this indication COMPARISON:  07/18/2018 FINDINGS: Lower chest: Small BILATERAL pleural effusions and mild bibasilar atelectasis Hepatobiliary: Gallbladder surgically absent.  Liver unremarkable. Pancreas: Normal appearance Spleen: Normal appearance Adrenals/Urinary Tract: Adrenal glands normal appearance. LEFT kidney normal appearance. RIGHT kidney enlarged with diffuse perinephric edema. Mild dilatation of RIGHT renal collecting system and RIGHT ureter. Bladder unremarkable. No ureteral calcification identified. Again seen 8 mm calculus at mid RIGHT kidney, nonobstructing. Again seen cyst at upper pole of RIGHT kidney, 3.0 x 2.9 cm, stable. Stomach/Bowel: Normal appendix. Stomach and bowel loops normal appearance Vascular/Lymphatic: Atherosclerotic calcifications aorta and iliac arteries without aneurysm. No adenopathy. Reproductive: Uterus surgically absent with nonvisualization of ovaries Other: No free air or free fluid.  No hernia. Musculoskeletal: No acute osseous findings. IMPRESSION: Enlargement of RIGHT kidney and perinephric edema with dilatation of RIGHT renal collecting system and RIGHT ureter. No obstructing ureteral calculus identified. Findings are most suspicious for a passed calculus though infection/pyelonephritis not completely excluded; recommend correlation with urinalysis. Electronically Signed   By: Lavonia Dana M.D.   On: 08/26/2018 15:35      Assessment & Plan: Pt is a 53 y.o. African-American  female with with medical problems of CHF,Interstitial  lung disease, HTN, A fib , who was admitted to [facility] on 08/26/2018 for evaluation of  abdominal pain.  Diagnosed with sepsis due to acute pyelonephritis and ARF   1.  Acute kidney injury likely secondary ATN from  to ongoing infection/pyelonephritis Baseline creatinine 1.25/GFR 57 from August 15, 2018 An 8 mm  nonobstructing calculus is seen in the right kidney on the most recent CT Serum creatinine has further worsened to 3.24 today Small amount of IV fluids to prevent dehydration No acute indication for dialysis at present We will monitor carefully  2.  Hyperkalemia Start [place]      LOS: 1 Delories Mauri 8/9/20207:50 AM    Note: This note was prepared with Sales executive. Any transcription errors are unintentional

## 2018-08-27 NOTE — Progress Notes (Signed)
RN informed hospitalist, Dr. Marcille Blanco of patient critical PT/INR values - PT 41.8; INR 4.5.

## 2018-08-27 NOTE — Progress Notes (Signed)
[facility] for Warfarin  Indication: atrial fibrillation  Allergies  Allergen Reactions  . Morphine Hives and Rash  . Oxycodone Hcl Hives and Itching  . Dilaudid [Hydromorphone Hcl] Itching    Patient Measurements: Height: '5\' 4"'  (162.6 cm) Weight: 258 lb 2.5 oz (117.1 kg) IBW/kg (Calculated) : 54.7 Heparin Dosing Weight:   Vital Signs: Temp: 97.8 F (36.6 C) (08/09 0431) Temp Source: Oral (08/09 0431) BP: 137/70 (08/09 0431) Pulse Rate: 98 (08/09 0431)  Labs: Recent Labs    08/26/18 1221 08/26/18 1228 08/27/18 0420  HGB  --  8.2* 6.9*  HCT  --  25.6* 22.4*  PLT  --  283 239  LABPROT 27.2*  --  41.8*  INR 2.6*  --  4.5*  CREATININE  --  2.13* 3.24*    Estimated Creatinine Clearance: 25.6 mL/min (A) (by C-G formula based on SCr of 3.24 mg/dL (H)).   Medical History: Past Medical History:  Diagnosis Date  . (HFpEF) heart failure with preserved ejection fraction ([place])    a. 08/2017 Echo: EF 55-60%.  Grade 2 diastolic dysfunction.  Moderate mitral stenosis.  . Allergy   . Anemia   . Anxiety   . BRCA negative 03/22/2013  . Bronchitis 02/19/2016   ON LEVAQUIN PO  . Chest tightness   . CHF (congestive heart failure) ([place])   . Cigarette smoker 09/11/2017   8-10 day  . GERD (gastroesophageal reflux disease)   . History of kidney stones   . Hyperlipidemia   . Hypertension   . Interstitial lung disease ([place])    a. CT 2013 b. 02/2018 CXR noted recurrent intersistial changes ILD vs chronic bronchitis  . Moderate mitral stenosis    a.  08/2017 TEE: EF 60 to 65%.  Moderate mitral stenosis.  Mean gradient 14 mmHg.  Valve area 2.59 cm by planimetry, 2.72 cm by pressure half-time.  . Palpitations   . Persistent atrial fibrillation    a. 08/2017 s/p TEE/DCCV; b. CHA2DS2VASc = 2-->warfarin.    Medications:  Medications Prior to Admission  Medication Sig Dispense Refill Last Dose  . albuterol (VENTOLIN HFA) 108 (90 Base) MCG/ACT  inhaler Inhale 2 puffs into the lungs every 4 (four) hours as needed for wheezing or shortness of breath. 3 Inhaler 0 Unknown at PRN  . ALPRAZolam (XANAX) 0.5 MG tablet Take 1 tablet (0.5 mg total) by mouth at bedtime as needed for anxiety. 30 tablet 2 Unknown at PRN  . atorvastatin (LIPITOR) 40 MG tablet Take 1 tablet (40 mg total) by mouth daily at 6 PM. 90 tablet 3 08/25/2018 at 1800  . celecoxib (CELEBREX) 200 MG capsule Take 200 mg by mouth at bedtime as needed for mild pain.    Unknown at PRN  . diltiazem (CARDIZEM CD) 240 MG 24 hr capsule Take 1 capsule (240 mg total) by mouth daily. 90 capsule 1 08/26/2018 at 0730  . estradiol (ESTRACE) 0.5 MG tablet Take 1 tablet (0.5 mg total) by mouth daily. 90 tablet 1 08/26/2018 at 0730  . furosemide (LASIX) 20 MG tablet Take 1 tablet (20 mg) by mouth once daily. You may take 1 extra tablet (20 mg) after lunch as needed for leg swelling (Patient taking differently: Take 20-40 mg by mouth See admin instructions. Take 40 mg in the morning and 20 mg in the evening) 135 tablet 2 08/26/2018 at 0730  . metoprolol succinate (TOPROL-XL) 25 MG 24 hr tablet Take 1 tablet (25 mg total) by mouth  daily. Take with or immediately following a meal. 90 tablet 0 08/26/2018 at 0730  . montelukast (SINGULAIR) 10 MG tablet Take 1 tablet (10 mg total) by mouth at bedtime. 90 tablet 2 08/25/2018 at 2100  . naproxen sodium (ALEVE) 220 MG tablet Take 440 mg by mouth daily as needed (pain).   Unknown at PRN  . omeprazole (PRILOSEC) 40 MG capsule Take 1 capsule (40 mg total) by mouth daily. 90 capsule 1 08/26/2018 at 0730  . potassium chloride (K-DUR) 10 MEQ tablet Take 2 tablets (20 meq) by mouth once daily (Patient taking differently: Take 20 mEq by mouth daily. ) 180 tablet 2 08/25/2018 at 0700  . sulfamethoxazole-trimethoprim (BACTRIM DS) 800-160 MG tablet Take 1 tablet by mouth daily. 4 tablet 0 08/25/2018 at 1800  . tamsulosin (FLOMAX) 0.4 MG CAPS capsule Take 0.4 mg by mouth every evening.     08/25/2018 at 1800  . warfarin (COUMADIN) 2.5 MG tablet TAKE 1/2 TO 1 TABLET DAILY AS DIRECTED BY COUMADIN CLINIC. (Patient taking differently: Take 1.25-2.5 mg by mouth See admin instructions. Take 2.5 mg at night on Mon, Wed, and Fri.  Take 1.25 mg at night on Sun, Tue, Thur, and Sat) 90 tablet 1 08/25/2018 at 2100  . chlorpheniramine-HYDROcodone (TUSSIONEX PENNKINETIC ER) 10-8 MG/5ML SUER Take 5 mLs by mouth every 12 (twelve) hours as needed for cough. (Patient not taking: Reported on 08/26/2018) 115 mL 0 Not Taking at Unknown time    Assessment: Pharmacy consulted to dose warfarin in this 53 year old female with Afib.  Pt was on Warfarin 2.5 mg PO MWF and warfarin 1.25 mg PO T-Th-Sat-Sun.    Last dose on 8/7.     8/8 INR  2.6  1.25 mg 8/9 INR  4.5  HOLD dose  Goal of Therapy:  INR 2-3   Plan:  Will Hold Warfarin dose today and recheck INR with AM labs.  On meropenem.Appears pt was on Bactrim PTA s/p R ureteral stent ~1 week ago. Lokelma can also increase warfarin concentration per uptodate.    Adhvik Canady A 08/27/2018,9:40 AM

## 2018-08-27 NOTE — Progress Notes (Signed)
[facility] [place] NAME: Leslie [name]    MR#:  [id number]  DATE OF BIRTH:  [birth year]  SUBJECTIVE:  CHIEF COMPLAINT:   Chief Complaint  Patient presents with  . Flank Pain   Right abdominal pain and flank pain, hematuria, and possible bloody stool. REVIEW OF SYSTEMS:  Review of Systems  Constitutional: Positive for malaise/fatigue. Negative for chills and fever.  HENT: Negative for sore throat.   Eyes: Negative for blurred vision and double vision.  Respiratory: Negative for cough, hemoptysis, shortness of breath, wheezing and stridor.   Cardiovascular: Negative for chest pain, palpitations, orthopnea and leg swelling.  Gastrointestinal: Positive for abdominal pain, blood in stool, nausea and vomiting. Negative for diarrhea and melena.  Genitourinary: Positive for flank pain and hematuria. Negative for dysuria.  Musculoskeletal: Negative for back pain and joint pain.  Skin: Negative for rash.  Neurological: Negative for dizziness, sensory change, focal weakness, seizures, loss of consciousness, weakness and headaches.  Endo/Heme/Allergies: Negative for polydipsia.  Psychiatric/Behavioral: Negative for depression. The patient is not nervous/anxious.     DRUG ALLERGIES:   Allergies  Allergen Reactions  . Morphine Hives and Rash  . Oxycodone Hcl Hives and Itching  . Dilaudid [Hydromorphone Hcl] Itching   VITALS:  Blood pressure (!) 115/58, pulse 87, temperature 98.1 F (36.7 C), temperature source Oral, resp. rate 20, height 5\' 4"  (1.626 m), weight 117.1 kg, SpO2 100 %. PHYSICAL EXAMINATION:  Physical Exam Constitutional:      General: She is not in acute distress. HENT:     Head: Normocephalic.     Mouth/Throat:     Mouth: Mucous membranes are moist.  Eyes:     General: No scleral icterus.    Conjunctiva/sclera: Conjunctivae normal.     Pupils: Pupils are equal, round, and reactive to light.  Neck:   Musculoskeletal: Normal range of motion and neck supple.     Vascular: No JVD.     Trachea: No tracheal deviation.  Cardiovascular:     Rate and Rhythm: Normal rate and regular rhythm.     Heart sounds: Normal heart sounds. No murmur. No gallop.   Pulmonary:     Effort: Pulmonary effort is normal. No respiratory distress.     Breath sounds: Normal breath sounds. No wheezing or rales.  Abdominal:     General: Bowel sounds are normal. There is no distension.     Palpations: Abdomen is soft.     Tenderness: There is abdominal tenderness. There is right CVA tenderness. There is no rebound.  Musculoskeletal: Normal range of motion.        General: No tenderness.     Right lower leg: No edema.     Left lower leg: No edema.  Skin:    Findings: No erythema or rash.  Neurological:     Mental Status: She is alert and oriented to person, place, and time.     Cranial Nerves: No cranial nerve deficit.  Psychiatric:        Mood and Affect: Mood normal.    LABORATORY PANEL:  Female CBC Recent Labs  Lab 08/27/18 0420  WBC 16.8*  HGB 6.9*  HCT 22.4*  PLT 239   ------------------------------------------------------------------------------------------------------------------ Chemistries  Recent Labs  Lab 08/27/18 0420  NA 135  K 5.6*  CL 104  CO2 21*  GLUCOSE 108*  BUN 42*  CREATININE 3.24*  CALCIUM 8.1*   RADIOLOGY:  Dg Chest 2 View  Result Date: 08/26/2018  CLINICAL DATA:  Former smoker. Shortness of breath. History of interstitial lung disease. EXAM: CHEST - 2 VIEW COMPARISON:  March 16, 2018 FINDINGS: No pneumothorax. Increasing bilateral pulmonary opacities, particularly in the bases, worsened in the interval. The heart size borderline. The hila and mediastinum are unremarkable. IMPRESSION: The findings may represent atypical infection versus pulmonary edema. Recommend clinical correlation. Electronically Signed   By: Dorise Bullion III M.D   On: 08/26/2018 15:51   Ct Chest  Wo Contrast  Result Date: 08/26/2018 CLINICAL DATA:  Shortness of breath EXAM: CT CHEST WITHOUT CONTRAST TECHNIQUE: Multidetector CT imaging of the chest was performed following the standard protocol without IV contrast. COMPARISON:  None. FINDINGS: Cardiovascular: The heart size is mildly enlarged. Aortic calcifications are noted. The main pulmonary artery is dilated measuring up to approximately 3.3 cm in diameter. There is no significant pericardial effusion. Mediastinum/Nodes: --there prominent mediastinal and hilar lymph nodes bilaterally. --No axillary lymphadenopathy. --No supraclavicular lymphadenopathy. --Normal thyroid gland. --The esophagus is unremarkable Lungs/Pleura: There are small bilateral pleural effusions. The lung volumes are low. There is a mosaic appearance of the lung fields bilaterally with scattered areas of ground-glass opacification. There are more focal opacities at the lung bases favored to represent atelectasis. There is interlobular septal thickening. There is no pneumothorax. Upper Abdomen: No acute abnormality. Musculoskeletal: No chest wall abnormality. No acute or significant osseous findings. Review of the MIP images confirms the above findings. IMPRESSION: 1. Overall findings concerning for developing pulmonary edema. An atypical infectious process is not entirely excluded and should be correlated clinically. 2. Small bilateral pleural effusions. 3. Mediastinal and hilar adenopathy, presumably reactive in etiology. 4. Low lung volumes. 5. Dilated main pulmonary artery which can be seen in patients with elevated PA pressures. Aortic Atherosclerosis (ICD10-I70.0). Electronically Signed   By: Constance Holster M.D.   On: 08/26/2018 18:55   Ct Renal Stone Study  Result Date: 08/26/2018 CLINICAL DATA:  Flank pain question recurrent stone disease EXAM: CT ABDOMEN AND PELVIS WITHOUT CONTRAST TECHNIQUE: Multidetector CT imaging of the abdomen and pelvis was performed following the  standard protocol without IV contrast. Sagittal and coronal MPR images reconstructed from axial data set. Oral contrast not administered for this indication COMPARISON:  07/18/2018 FINDINGS: Lower chest: Small BILATERAL pleural effusions and mild bibasilar atelectasis Hepatobiliary: Gallbladder surgically absent.  Liver unremarkable. Pancreas: Normal appearance Spleen: Normal appearance Adrenals/Urinary Tract: Adrenal glands normal appearance. LEFT kidney normal appearance. RIGHT kidney enlarged with diffuse perinephric edema. Mild dilatation of RIGHT renal collecting system and RIGHT ureter. Bladder unremarkable. No ureteral calcification identified. Again seen 8 mm calculus at mid RIGHT kidney, nonobstructing. Again seen cyst at upper pole of RIGHT kidney, 3.0 x 2.9 cm, stable. Stomach/Bowel: Normal appendix. Stomach and bowel loops normal appearance Vascular/Lymphatic: Atherosclerotic calcifications aorta and iliac arteries without aneurysm. No adenopathy. Reproductive: Uterus surgically absent with nonvisualization of ovaries Other: No free air or free fluid.  No hernia. Musculoskeletal: No acute osseous findings. IMPRESSION: Enlargement of RIGHT kidney and perinephric edema with dilatation of RIGHT renal collecting system and RIGHT ureter. No obstructing ureteral calculus identified. Findings are most suspicious for a passed calculus though infection/pyelonephritis not completely excluded; recommend correlation with urinalysis. Electronically Signed   By: Lavonia Dana M.D.   On: 08/26/2018 15:35   ASSESSMENT AND PLAN:   Devanny Palecek  is a 53 y.o. female with a known history of persistent atrial fibrillation status post TEE cardioversion on Coumadin, hypertension, history of interstitial lung disease, diastolic heart failure, GERD,  obesity and kidney stones presents to hospital secondary to worsening abdominal pain, nausea, shortness of breath and dysuria.  1.  Sepsis-likely secondary to acute  pyelonephritis on the right side -Also had a recent right ureteral stent 1 week ago which has been removed now. Follow-up blood cultures and urine cultures.  Lactic acid is within normal limits. Continue meropenem-given complicated UTI and recent use of Bactrim as outpatient.  2.  Acute renal failure on CKD stage III-Baseline creatinine seems to be around 1.2.  Worsening. -Also has been taking Toradol for her back pain as needed -Avoid nephrotoxins -No recent IV contrast -CT of the abdomen showing enlargement of right kidney with some perinephric edema, no obstructing calculus noted. Hold Lasix. Start fluids, follow BMP and nephrology consult.  3.    Acute respiratory failure with hypoxia.  -Chest x-ray showing bibasilar increased interstitial markings could be pulmonary edema versus atypical pneumonitis. -Chest x-ray from February also showing coarse interstitial markings.  Possible chronic interstitial lung disease.  Prior history of smoking -Check BNP.  1 dose of IV Lasix being given -Known history of diastolic heart failure with last echo showing normal ejection fraction.  Also complains of 1+ pedal edema in the last week. -Plain CT chest without contrast: Overall findings concerning for developing pulmonary edema. An atypical infectious process is not entirely excluded and should be correlated clinically. Hold Lasix due to worsening renal failure. Continue oxygen via nasal cannula.  IV as needed.  4.  Persistent atrial fibrillation-currently remains in normal sinus rhythm.  History of TEE cardioversion in the past. -Continue oral Cardizem and metoprolol -On warfarin for anticoagulation.  Pharmacy to adjust the dose Hold Coumadin due to possible GI bleeding.  Hyperkalemia.  Possible due to acute renal failure.  Start Veltassa for Dr. Candiss Norse. Possible GI bleeding and bloody stool. Hold Coumadin.  Follow-up stool occult.  Supratherapeutic INR.  Hold Coumadin.  Anemia of chronic  disease.  Hemoglobin decreased to 6.9.  1 unit PRBC transfusion.  Follow hemoglobin.  All the records are reviewed and case discussed with Care Management/Social Worker. Management plans discussed with the patient, family and they are in agreement.  CODE STATUS: Full Code  TOTAL TIME TAKING CARE OF THIS PATIENT: 38 minutes.   More than 50% of the time was spent in counseling/coordination of care: YES  POSSIBLE D/C IN 3 DAYS, DEPENDING ON CLINICAL CONDITION.   Demetrios Loll M.D on 08/27/2018 at 1:27 PM  Between 7am to 6pm - Pager - [phone]  After 6pm go to [email or web address] - [facility]

## 2018-08-28 ENCOUNTER — Inpatient Hospital Stay: Payer: Managed Care, Other (non HMO)

## 2018-08-28 LAB — PROTIME-INR
INR: 5.2 (ref 0.8–1.2)
Prothrombin Time: 47 seconds — ABNORMAL HIGH (ref 11.4–15.2)

## 2018-08-28 LAB — HIV ANTIBODY (ROUTINE TESTING W REFLEX): HIV Screen 4th Generation wRfx: NONREACTIVE

## 2018-08-28 LAB — BASIC METABOLIC PANEL
Anion gap: 11 (ref 5–15)
BUN: 58 mg/dL — ABNORMAL HIGH (ref 6–20)
CO2: 21 mmol/L — ABNORMAL LOW (ref 22–32)
Calcium: 8.2 mg/dL — ABNORMAL LOW (ref 8.9–10.3)
Chloride: 101 mmol/L (ref 98–111)
Creatinine, Ser: 4.1 mg/dL — ABNORMAL HIGH (ref 0.44–1.00)
GFR calc Af Amer: 14 mL/min — ABNORMAL LOW (ref >60)
GFR calc non Af Amer: 12 mL/min — ABNORMAL LOW (ref >60)
Glucose, Bld: 93 mg/dL (ref 70–99)
Potassium: 4.3 mmol/L (ref 3.5–5.1)
Sodium: 133 mmol/L — ABNORMAL LOW (ref 135–145)

## 2018-08-28 LAB — CBC
HCT: 25.9 % — ABNORMAL LOW (ref 36.0–46.0)
Hemoglobin: 8.3 g/dL — ABNORMAL LOW (ref 12.0–15.0)
MCH: 26.8 pg (ref 26.0–34.0)
MCHC: 32 g/dL (ref 30.0–36.0)
MCV: 83.5 fL (ref 80.0–100.0)
Platelets: 220 10*3/uL (ref 150–400)
RBC: 3.1 MIL/uL — ABNORMAL LOW (ref 3.87–5.11)
RDW: 16.9 % — ABNORMAL HIGH (ref 11.5–15.5)
WBC: 15.3 10*3/uL — ABNORMAL HIGH (ref 4.0–10.5)
nRBC: 0.3 % — ABNORMAL HIGH (ref 0.0–0.2)

## 2018-08-28 LAB — GLUCOSE, CAPILLARY: Glucose-Capillary: 98 mg/dL (ref 70–99)

## 2018-08-28 MED ORDER — BISACODYL 5 MG PO TBEC: 5.0000 mg | DELAYED_RELEASE_TABLET | Freq: Every day | ORAL | Status: DC | PRN

## 2018-08-28 MED ORDER — SENNA 8.6 MG PO TABS: 1.0000 | ORAL_TABLET | Freq: Every day | ORAL | Status: DC | PRN

## 2018-08-28 MED ORDER — SODIUM CHLORIDE 0.9 % IV SOLN
INTRAVENOUS | Status: DC
  Administered 2018-08-28: 15:00:00 via INTRAVENOUS

## 2018-08-28 NOTE — Progress Notes (Signed)
[facility] at [place] NAME: Leslie [name]    MR#:  [id number]  DATE OF BIRTH:  02-07-1965  SUBJECTIVE:  CHIEF COMPLAINT:   Chief Complaint  Patient presents with   Flank Pain   Right abdominal pain and flank pain, not sure hematuria, and no bowel movement.  She is on oxygen by nasal cannula 2 L due to hypoxia. REVIEW OF SYSTEMS:  Review of Systems  Constitutional: Positive for malaise/fatigue. Negative for chills and fever.  HENT: Negative for sore throat.   Eyes: Negative for blurred vision and double vision.  Respiratory: Negative for cough, hemoptysis, shortness of breath, wheezing and stridor.   Cardiovascular: Negative for chest pain, palpitations, orthopnea and leg swelling.  Gastrointestinal: Positive for abdominal pain. Negative for blood in stool, diarrhea, melena, nausea and vomiting.  Genitourinary: Positive for flank pain and hematuria. Negative for dysuria.  Musculoskeletal: Negative for back pain and joint pain.  Skin: Negative for rash.  Neurological: Negative for dizziness, sensory change, focal weakness, seizures, loss of consciousness, weakness and headaches.  Endo/Heme/Allergies: Negative for polydipsia.  Psychiatric/Behavioral: Negative for depression. The patient is not nervous/anxious.     DRUG ALLERGIES:   Allergies  Allergen Reactions   Morphine Hives and Rash   Oxycodone Hcl Hives and Itching   Dilaudid [Hydromorphone Hcl] Itching   VITALS:  Blood pressure 136/83, pulse 99, temperature 98.3 F (36.8 C), temperature source Oral, resp. rate 18, height 5\' 4"  (1.626 m), weight 117.1 kg, SpO2 94 %. PHYSICAL EXAMINATION:  Physical Exam Constitutional:      General: She is not in acute distress. HENT:     Head: Normocephalic.     Mouth/Throat:     Mouth: Mucous membranes are moist.  Eyes:     General: No scleral icterus.    Conjunctiva/sclera: Conjunctivae normal.     Pupils: Pupils are equal, round,  and reactive to light.  Neck:     Musculoskeletal: Normal range of motion and neck supple.     Vascular: No JVD.     Trachea: No tracheal deviation.  Cardiovascular:     Rate and Rhythm: Normal rate and regular rhythm.     Heart sounds: Normal heart sounds. No murmur. No gallop.   Pulmonary:     Effort: Pulmonary effort is normal. No respiratory distress.     Breath sounds: Normal breath sounds. No wheezing or rales.  Abdominal:     General: Bowel sounds are normal. There is no distension.     Palpations: Abdomen is soft.     Tenderness: There is abdominal tenderness. There is right CVA tenderness. There is no rebound.  Musculoskeletal: Normal range of motion.        General: No tenderness.     Right lower leg: No edema.     Left lower leg: No edema.  Skin:    Findings: No erythema or rash.  Neurological:     Mental Status: She is alert and oriented to person, place, and time.     Cranial Nerves: No cranial nerve deficit.  Psychiatric:        Mood and Affect: Mood normal.    LABORATORY PANEL:  Female CBC Recent Labs  Lab 08/28/18 0554  WBC 15.3*  HGB 8.3*  HCT 25.9*  PLT 220   ------------------------------------------------------------------------------------------------------------------ Chemistries  Recent Labs  Lab 08/28/18 0554  NA 133*  K 4.3  CL 101  CO2 21*  GLUCOSE 93  BUN 58*  CREATININE  4.10*  CALCIUM 8.2*   RADIOLOGY:  US Renal  Result Date: 08/28/2018 CLINICAL DATA:  Acute renal failure. EXAM: RENAL / URINARY TRACT ULTRASOUND COMPLETE COMPARISON:  Abdominal CT 08/26/2018. FINDINGS: Right Kidney: Renal measurements: 12.2 x 6.2 x 6.5 cm = volume: 257 mL. There is increased cortical echogenicity. There is a cyst in the upper pole measuring 2.8 cm maximally. A shadowing calculus is also present in the upper pole. No hydronephrosis. Left Kidney: Renal measurements: 9.8 x 7.5 x 6.3 cm = volume: 241 mL. Increased cortical echogenicity with preserved  cortical thickness. No hydronephrosis. Bladder: Appears normal for degree of bladder distention. IMPRESSION: 1. Both kidneys demonstrate increased cortical echogenicity as can be seen with medical renal disease. 2. No hydronephrosis.  Right renal cyst and calculus again noted. Electronically Signed   By: Richardean Sale M.D.   On: 08/28/2018 11:30   ASSESSMENT AND PLAN:   Leslie [name]  is a 53 y.o. female with a known history of persistent atrial fibrillation status post TEE cardioversion on Coumadin, hypertension, history of interstitial lung disease, diastolic heart failure, GERD, obesity and kidney stones presents to hospital secondary to worsening abdominal pain, nausea, shortness of breath and dysuria.  1.  Sepsis-likely secondary to acute pyelonephritis on the right side -Also had a recent right ureteral stent 1 week ago which has been removed now. Follow-up blood cultures.  Lactic acid is within normal limits. Continue meropenem-given complicated UTI and recent use of Bactrim as outpatient. Urine culture >=100,000 COLONIES/mL ESCHERICHIA COLI. Still leukocytosis.  Follow-up CBC.  2.  Acute renal failure on CKD stage III-Baseline creatinine seems to be around 1.2.  Worsening. -Also has been taking Toradol for her back pain as needed -Avoid nephrotoxins -No recent IV contrast -CT of the abdomen showing enlargement of right kidney with some perinephric edema, no obstructing calculus noted.  Repeat renal ultrasound report no hydronephrosis. Hold Lasix. Continue gentle rehydration with fluids, follow BMP per Dr. Holley Raring.  Hyponatremia.  Continue normal saline IV and follow-up BMP.  3.    Acute respiratory failure with hypoxia.  -Chest x-ray showing bibasilar increased interstitial markings could be pulmonary edema versus atypical pneumonitis. -Chest x-ray from February also showing coarse interstitial markings.  Possible chronic interstitial lung disease.  Prior history of  smoking -Check BNP.  1 dose of IV Lasix being given -Known history of diastolic heart failure with last echo showing normal ejection fraction.  Also complains of 1+ pedal edema in the last week. -Plain CT chest without contrast: Overall findings concerning for developing pulmonary edema. An atypical infectious process is not entirely excluded and should be correlated clinically. Hold Lasix due to worsening renal failure. Continue oxygen via nasal cannula.  IV as needed.  4.  Persistent atrial fibrillation-currently remains in normal sinus rhythm.  History of TEE cardioversion in the past. -Continue oral Cardizem and metoprolol -On warfarin for anticoagulation.  Pharmacy to adjust the dose Hold Coumadin due to possible GI bleeding.  Hyperkalemia.  Possible due to acute renal failure.  Started Veltassa for Dr. Candiss Norse.  Improved. Possible GI bleeding and bloody stool. Hold Coumadin.  Follow-up stool occult.  Supratherapeutic INR.  INR 5.2.  Hold Coumadin.  Anemia of chronic disease.  Hemoglobin decreased to 6.9.  S/P 1 unit PRBC transfusion.  Follow hemoglobin 8.3.  I discussed with Dr. Holley Raring. All the records are reviewed and case discussed with Care Management/Social Worker. Management plans discussed with the patient, her husband and they are in agreement.  CODE STATUS:  Full Code  TOTAL TIME TAKING CARE OF THIS PATIENT: 36 minutes.   More than 50% of the time was spent in counseling/coordination of care: YES  POSSIBLE D/C IN 3 DAYS, DEPENDING ON CLINICAL CONDITION.   Demetrios Loll M.D on 08/28/2018 at 1:45 PM  Between 7am to 6pm - Pager - [phone]  After 6pm go to [email or web address] - [facility]

## 2018-08-28 NOTE — Progress Notes (Signed)
[facility]  ROUNDING NOTE   Subjective:  Patient with worsening renal failure at the moment. Creatinine up to 4.1. EGFR 14. Still remains nauseous.   Objective:  Vital signs in last 24 hours:  Temp:  [97.8 F (36.6 C)-98.3 F (36.8 C)] 98.3 F (36.8 C) (08/10 0420) Pulse Rate:  [87-98] 96 (08/10 0420) Resp:  [18-20] 18 (08/10 0420) BP: (114-135)/(58-67) 123/67 (08/10 0420) SpO2:  [94 %-100 %] 99 % (08/10 0420)  Weight change:  Filed Weights   08/26/18 1226 08/26/18 2008  Weight: 101.2 kg 117.1 kg    Intake/Output: I/O last 3 completed shifts: In: 1615.4 [P.O.:240; I.V.:670.4; Blood:405; IV Piggyback:300.1] Out: 2100 [Urine:2100]   Intake/Output this shift:  Total I/O In: 120 [P.O.:120] Out: 400 [Urine:400]  Physical Exam: General: No acute distress  Head: Normocephalic, atraumatic. Moist oral mucosal membranes  Eyes: Anicteric  Neck: Supple, trachea midline  Lungs:  Clear to auscultation, normal effort  Heart: S1S2 no rubs  Abdomen:  Soft, nontender, bowel sounds present  Extremities:  peripheral edema.  Neurologic: Awake, alert, following commands  Skin: No lesions       Basic Metabolic Panel: Recent Labs  Lab 08/26/18 1228 08/27/18 0420 08/28/18 0554  NA 134* 135 133*  K 4.0 5.6* 4.3  CL 101 104 101  CO2 21* 21* 21*  GLUCOSE 144* 108* 93  BUN 30* 42* 58*  CREATININE 2.13* 3.24* 4.10*  CALCIUM 8.2* 8.1* 8.2*    Liver Function Tests: No results for input(s): AST, ALT, ALKPHOS, BILITOT, PROT, ALBUMIN in the last 168 hours. No results for input(s): LIPASE, AMYLASE in the last 168 hours. No results for input(s): AMMONIA in the last 168 hours.  CBC: Recent Labs  Lab 08/26/18 1228 08/27/18 0420 08/28/18 0554  WBC 16.6* 16.8* 15.3*  HGB 8.2* 6.9* 8.3*  HCT 25.6* 22.4* 25.9*  MCV 83.1 85.5 83.5  PLT 283 239 220    Cardiac Enzymes: No results for input(s): CKTOTAL, CKMB, CKMBINDEX, TROPONINI in the last 168  hours.  BNP: Invalid input(s): POCBNP  CBG: No results for input(s): GLUCAP in the last 168 hours.  Microbiology: Results for orders placed or performed during the hospital encounter of 08/26/18  Blood culture (routine x 2)     Status: None (Preliminary result)   Collection Time: 08/26/18  3:05 PM   Specimen: BLOOD  Result Value Ref Range Status   Specimen Description BLOOD RIGHT ANTECUBITAL  Final   Special Requests   Final    BOTTLES DRAWN AEROBIC AND ANAEROBIC Blood Culture adequate volume   Culture   Final    NO GROWTH 2 DAYS Performed at [facility], [address], [place], [place] [address]    Report Status PENDING  Incomplete  Blood culture (routine x 2)     Status: None (Preliminary result)   Collection Time: 08/26/18  3:05 PM   Specimen: BLOOD  Result Value Ref Range Status   Specimen Description BLOOD BLOOD LEFT HAND  Final   Special Requests   Final    BOTTLES DRAWN AEROBIC AND ANAEROBIC Blood Culture adequate volume   Culture   Final    NO GROWTH 2 DAYS Performed at [facility], [address] [place], [place] [address]    Report Status PENDING  Incomplete  Urine culture     Status: Abnormal (Preliminary result)   Collection Time: 08/26/18  4:32 PM   Specimen: Urine, Clean Catch  Result Value Ref Range Status   Specimen Description  Final    URINE, CLEAN CATCH Performed at [facility], [address] [place], [place] [address]    Special Requests   Final    Normal Performed at [facility], [address] [place], [place] [address]    Culture (A)  Final    >=100,000 COLONIES/mL ESCHERICHIA COLI SUSCEPTIBILITIES TO FOLLOW Performed at [facility] [address] [place], [place] [address]    Report Status PENDING  Incomplete  SARS Coronavirus 2 [facility] order, Performed in [facility] hospital lab) Nasopharyngeal Nasopharyngeal Swab     Status: None   Collection Time: 08/26/18  4:34 PM    Specimen: Nasopharyngeal Swab  Result Value Ref Range Status   SARS Coronavirus 2 NEGATIVE NEGATIVE Final    Comment: (NOTE) If result is NEGATIVE SARS-CoV-2 target nucleic acids are NOT DETECTED. The SARS-CoV-2 RNA is generally detectable in upper and lower  respiratory specimens during the acute phase of infection. The lowest  concentration of SARS-CoV-2 viral copies this assay can detect is 250  copies / mL. A negative result does not preclude SARS-CoV-2 infection  and should not be used as the sole basis for treatment or other  patient management decisions.  A negative result may occur with  improper specimen collection / handling, submission of specimen other  than nasopharyngeal swab, presence of viral mutation(s) within the  areas targeted by this assay, and inadequate number of viral copies  (<250 copies / mL). A negative result must be combined with clinical  observations, patient history, and epidemiological information. If result is POSITIVE SARS-CoV-2 target nucleic acids are DETECTED. The SARS-CoV-2 RNA is generally detectable in upper and lower  respiratory specimens dur ing the acute phase of infection.  Positive  results are indicative of active infection with SARS-CoV-2.  Clinical  correlation with patient history and other diagnostic information is  necessary to determine patient infection status.  Positive results do  not rule out bacterial infection or co-infection with other viruses. If result is PRESUMPTIVE POSTIVE SARS-CoV-2 nucleic acids MAY BE PRESENT.   A presumptive positive result was obtained on the submitted specimen  and confirmed on repeat testing.  While 2019 novel coronavirus  (SARS-CoV-2) nucleic acids may be present in the submitted sample  additional confirmatory testing may be necessary for epidemiological  and / or clinical management purposes  to differentiate between  SARS-CoV-2 and other Sarbecovirus currently known to infect humans.  If  clinically indicated additional testing with an alternate test  methodology [phone]) is advised. The SARS-CoV-2 RNA is generally  detectable in upper and lower respiratory sp ecimens during the acute  phase of infection. The expected result is Negative. Fact Sheet for Patients:  StrictlyIdeas.no Fact Sheet for Healthcare Providers: BankingDealers.co.za This test is not yet approved or cleared by the [place] FDA and has been authorized for detection and/or diagnosis of SARS-CoV-2 by FDA under an Emergency Use Authorization (EUA).  This EUA will remain in effect (meaning this test can be used) for the duration of the COVID-19 declaration under Section 564(b)(1) of the Act, 21 U.S.C. section 360bbb-3(b)(1), unless the authorization is terminated or revoked sooner. Performed at [facility], [address] [place], [place] [address]     Coagulation Studies: Recent Labs    08/26/18 1221 08/27/18 0420 08/28/18 0554  LABPROT 27.2* 41.8* 47.0*  INR 2.6* 4.5* 5.2*    Urinalysis: Recent Labs    08/26/18 1632  COLORURINE AMBER*  LABSPEC 1.011  PHURINE 5.0  GLUCOSEU NEGATIVE  HGBUR MODERATE*  BILIRUBINUR NEGATIVE  KETONESUR NEGATIVE  PROTEINUR 100*  NITRITE NEGATIVE  LEUKOCYTESUR LARGE*      Imaging: Dg Chest 2 View  Result Date: 08/26/2018 CLINICAL DATA:  Former smoker. Shortness of breath. History of interstitial lung disease. EXAM: CHEST - 2 VIEW COMPARISON:  March 16, 2018 FINDINGS: No pneumothorax. Increasing bilateral pulmonary opacities, particularly in the bases, worsened in the interval. The heart size borderline. The hila and mediastinum are unremarkable. IMPRESSION: The findings may represent atypical infection versus pulmonary edema. Recommend clinical correlation. Electronically Signed   By: Dorise Bullion III M.D   On: 08/26/2018 15:51   Ct Chest Wo Contrast  Result Date: 08/26/2018 CLINICAL  DATA:  Shortness of breath EXAM: CT CHEST WITHOUT CONTRAST TECHNIQUE: Multidetector CT imaging of the chest was performed following the standard protocol without IV contrast. COMPARISON:  None. FINDINGS: Cardiovascular: The heart size is mildly enlarged. Aortic calcifications are noted. The main pulmonary artery is dilated measuring up to approximately 3.3 cm in diameter. There is no significant pericardial effusion. Mediastinum/Nodes: --there prominent mediastinal and hilar lymph nodes bilaterally. --No axillary lymphadenopathy. --No supraclavicular lymphadenopathy. --Normal thyroid gland. --The esophagus is unremarkable Lungs/Pleura: There are small bilateral pleural effusions. The lung volumes are low. There is a mosaic appearance of the lung fields bilaterally with scattered areas of ground-glass opacification. There are more focal opacities at the lung bases favored to represent atelectasis. There is interlobular septal thickening. There is no pneumothorax. Upper Abdomen: No acute abnormality. Musculoskeletal: No chest wall abnormality. No acute or significant osseous findings. Review of the MIP images confirms the above findings. IMPRESSION: 1. Overall findings concerning for developing pulmonary edema. An atypical infectious process is not entirely excluded and should be correlated clinically. 2. Small bilateral pleural effusions. 3. Mediastinal and hilar adenopathy, presumably reactive in etiology. 4. Low lung volumes. 5. Dilated main pulmonary artery which can be seen in patients with elevated PA pressures. Aortic Atherosclerosis (ICD10-I70.0). Electronically Signed   By: Constance Holster M.D.   On: 08/26/2018 18:55   Ct Renal Stone Study  Result Date: 08/26/2018 CLINICAL DATA:  Flank pain question recurrent stone disease EXAM: CT ABDOMEN AND PELVIS WITHOUT CONTRAST TECHNIQUE: Multidetector CT imaging of the abdomen and pelvis was performed following the standard protocol without IV contrast. Sagittal  and coronal MPR images reconstructed from axial data set. Oral contrast not administered for this indication COMPARISON:  07/18/2018 FINDINGS: Lower chest: Small BILATERAL pleural effusions and mild bibasilar atelectasis Hepatobiliary: Gallbladder surgically absent.  Liver unremarkable. Pancreas: Normal appearance Spleen: Normal appearance Adrenals/Urinary Tract: Adrenal glands normal appearance. LEFT kidney normal appearance. RIGHT kidney enlarged with diffuse perinephric edema. Mild dilatation of RIGHT renal collecting system and RIGHT ureter. Bladder unremarkable. No ureteral calcification identified. Again seen 8 mm calculus at mid RIGHT kidney, nonobstructing. Again seen cyst at upper pole of RIGHT kidney, 3.0 x 2.9 cm, stable. Stomach/Bowel: Normal appendix. Stomach and bowel loops normal appearance Vascular/Lymphatic: Atherosclerotic calcifications aorta and iliac arteries without aneurysm. No adenopathy. Reproductive: Uterus surgically absent with nonvisualization of ovaries Other: No free air or free fluid.  No hernia. Musculoskeletal: No acute osseous findings. IMPRESSION: Enlargement of RIGHT kidney and perinephric edema with dilatation of RIGHT renal collecting system and RIGHT ureter. No obstructing ureteral calculus identified. Findings are most suspicious for a passed calculus though infection/pyelonephritis not completely excluded; recommend correlation with urinalysis. Electronically Signed   By: Lavonia Dana M.D.   On: 08/26/2018 15:35     Medications:   .  meropenem (MERREM) IV 1 g (08/28/18 0911)   . acetaminophen  650 mg Oral Once  . atorvastatin  40 mg Oral q1800  . diltiazem  240 mg Oral Daily  . docusate sodium  100 mg Oral BID  . metoprolol succinate  25 mg Oral Daily  . montelukast  10 mg Oral QHS  . pantoprazole  40 mg Oral Daily  . patiromer  16.8 g Oral Daily  . polyethylene glycol  17 g Oral Daily  . tamsulosin  0.4 mg Oral QPM  . Warfarin - Pharmacist Dosing Inpatient    Does not apply q1800   acetaminophen **OR** acetaminophen, ALPRAZolam, HYDROcodone-acetaminophen, ipratropium-albuterol, ondansetron **OR** ondansetron (ZOFRAN) IV  Assessment/ Plan:  53 y.o. female  with with medical problems of CHF,Interstitial lung disease, HTN, A fib , who was admitted to [facility] on 08/26/2018 for evaluation of abdominal pain.  Diagnosed with sepsis due to acute pyelonephritis and ARF   1.  Acute kidney injury likely secondary to ongoing infection/pyelonephritis/CKD stage III. Baseline creatinine 1.25/GFR 57 from August 15, 2018 An 8 mm  nonobstructing calculus is seen in the right kidney on the most recent CT -Creatinine is risen to 4.1 today with a BUN of 58.  No urgent indication for dialysis at the moment.  Continue to monitor renal parameters closely.  2.  Hyperkalemia.  Potassium down to 4.3.  Okay to maintain the patient on [place] 16.8 g p.o. daily.  Monitor serum potassium daily for now.   LOS: 2 Harpreet Signore 8/10/202011:31 AM

## 2018-08-28 NOTE — Progress Notes (Signed)
PT Cancellation Note  Patient Details Name: Leslie [name] MRN: [id number] DOB: [birth year]   Cancelled Treatment:    Reason Eval/Treat Not Completed: Patient not medically ready.  PT consult received.  Pt's INR noted to be up-trending to 5.2 today.  Per PT guidelines for elevated INR, pt on bedrest with INR>5.  Will hold PT at this time and re-attempt PT evaluation at a later date/time as medically appropriate.  Leitha Bleak, PT 08/28/18, 3:28 PM [phone]

## 2018-08-28 NOTE — Progress Notes (Signed)
[facility] for Warfarin  Indication: atrial fibrillation  Patient Measurements: Height: '5\' 4"'  (162.6 cm) Weight: 258 lb 2.5 oz (117.1 kg) IBW/kg (Calculated) : 54.7  Vital Signs: Temp: 98.3 F (36.8 C) (08/10 0420) Temp Source: Oral (08/10 0420) BP: 123/67 (08/10 0420) Pulse Rate: 96 (08/10 0420)  Labs: Recent Labs    08/26/18 1221  08/26/18 1228 08/27/18 0420 08/28/18 0554  HGB  --    < > 8.2* 6.9* 8.3*  HCT  --   --  25.6* 22.4* 25.9*  PLT  --   --  283 239 220  LABPROT 27.2*  --   --  41.8* 47.0*  INR 2.6*  --   --  4.5* 5.2*  CREATININE  --   --  2.13* 3.24* 4.10*   < > = values in this interval not displayed.    Estimated Creatinine Clearance: 20.2 mL/min (A) (by C-G formula based on SCr of 4.1 mg/dL (H)).   Medical History: Past Medical History:  Diagnosis Date  . (HFpEF) heart failure with preserved ejection fraction ([place])    a. 08/2017 Echo: EF 55-60%.  Grade 2 diastolic dysfunction.  Moderate mitral stenosis.  . Allergy   . Anemia   . Anxiety   . BRCA negative 03/22/2013  . Bronchitis 02/19/2016   ON LEVAQUIN PO  . Chest tightness   . CHF (congestive heart failure) ([place])   . Cigarette smoker 09/11/2017   8-10 day  . GERD (gastroesophageal reflux disease)   . History of kidney stones   . Hyperlipidemia   . Hypertension   . Interstitial lung disease ([place])    a. CT 2013 b. 02/2018 CXR noted recurrent intersistial changes ILD vs chronic bronchitis  . Moderate mitral stenosis    a.  08/2017 TEE: EF 60 to 65%.  Moderate mitral stenosis.  Mean gradient 14 mmHg.  Valve area 2.59 cm by planimetry, 2.72 cm by pressure half-time.  . Palpitations   . Persistent atrial fibrillation    a. 08/2017 s/p TEE/DCCV; b. CHA2DS2VASc = 2-->warfarin.    Medications:  Medications Prior to Admission  Medication Sig Dispense Refill Last Dose  . albuterol (VENTOLIN HFA) 108 (90 Base) MCG/ACT inhaler Inhale 2 puffs into the lungs every  4 (four) hours as needed for wheezing or shortness of breath. 3 Inhaler 0 Unknown at PRN  . ALPRAZolam (XANAX) 0.5 MG tablet Take 1 tablet (0.5 mg total) by mouth at bedtime as needed for anxiety. 30 tablet 2 Unknown at PRN  . atorvastatin (LIPITOR) 40 MG tablet Take 1 tablet (40 mg total) by mouth daily at 6 PM. 90 tablet 3 08/25/2018 at 1800  . celecoxib (CELEBREX) 200 MG capsule Take 200 mg by mouth at bedtime as needed for mild pain.    Unknown at PRN  . diltiazem (CARDIZEM CD) 240 MG 24 hr capsule Take 1 capsule (240 mg total) by mouth daily. 90 capsule 1 08/26/2018 at 0730  . estradiol (ESTRACE) 0.5 MG tablet Take 1 tablet (0.5 mg total) by mouth daily. 90 tablet 1 08/26/2018 at 0730  . furosemide (LASIX) 20 MG tablet Take 1 tablet (20 mg) by mouth once daily. You may take 1 extra tablet (20 mg) after lunch as needed for leg swelling (Patient taking differently: Take 20-40 mg by mouth See admin instructions. Take 40 mg in the morning and 20 mg in the evening) 135 tablet 2 08/26/2018 at 0730  . metoprolol succinate (TOPROL-XL) 25 MG 24 hr  tablet Take 1 tablet (25 mg total) by mouth daily. Take with or immediately following a meal. 90 tablet 0 08/26/2018 at 0730  . montelukast (SINGULAIR) 10 MG tablet Take 1 tablet (10 mg total) by mouth at bedtime. 90 tablet 2 08/25/2018 at 2100  . naproxen sodium (ALEVE) 220 MG tablet Take 440 mg by mouth daily as needed (pain).   Unknown at PRN  . omeprazole (PRILOSEC) 40 MG capsule Take 1 capsule (40 mg total) by mouth daily. 90 capsule 1 08/26/2018 at 0730  . potassium chloride (K-DUR) 10 MEQ tablet Take 2 tablets (20 meq) by mouth once daily (Patient taking differently: Take 20 mEq by mouth daily. ) 180 tablet 2 08/25/2018 at 0700  . sulfamethoxazole-trimethoprim (BACTRIM DS) 800-160 MG tablet Take 1 tablet by mouth daily. 4 tablet 0 08/25/2018 at 1800  . tamsulosin (FLOMAX) 0.4 MG CAPS capsule Take 0.4 mg by mouth every evening.    08/25/2018 at 1800  . warfarin (COUMADIN) 2.5  MG tablet TAKE 1/2 TO 1 TABLET DAILY AS DIRECTED BY COUMADIN CLINIC. (Patient taking differently: Take 1.25-2.5 mg by mouth See admin instructions. Take 2.5 mg at night on Mon, Wed, and Fri.  Take 1.25 mg at night on Sun, Tue, Thur, and Sat) 90 tablet 1 08/25/2018 at 2100  . chlorpheniramine-HYDROcodone (TUSSIONEX PENNKINETIC ER) 10-8 MG/5ML SUER Take 5 mLs by mouth every 12 (twelve) hours as needed for cough. (Patient not taking: Reported on 08/26/2018) 115 mL 0 Not Taking at Unknown time    Assessment: Pharmacy consulted to dose warfarin in this 53 year old female with Afib.  Pt was on Warfarin 2.5 mg PO MWF and warfarin 1.25 mg PO T-Th-Sat-Sun. DDIs include meropenem, Lokelma and Bactrim. It appears she was on Bactrim PTA s/p R ureteral stent ~1 week ago.     Date  INR  Dose 8/8   2.6    1.25 mg 8/9  4.5    HOLD  8/10  5.2  HOLD  Goal of Therapy:  INR 2-3   Plan:   Hold Warfarin dose today   recheck INR with AM labs.   Dallie Piles, PharmD 08/28/2018,7:30 AM

## 2018-08-28 NOTE — Progress Notes (Signed)
Spoke with Dr. Marcille Blanco about critical INR of 5.2 . No new orders at this time. Terrial Rhodes

## 2018-08-29 ENCOUNTER — Inpatient Hospital Stay (HOSPITAL_COMMUNITY)
Admit: 2018-08-29 | Discharge: 2018-08-29 | Disposition: A | Discharge status: Home / Self Care | Payer: Managed Care, Other (non HMO) | Attending: Internal Medicine | Admitting: Internal Medicine

## 2018-08-29 DIAGNOSIS — N179 Acute kidney failure, unspecified: Secondary | ICD-10-CM

## 2018-08-29 DIAGNOSIS — I050 Rheumatic mitral stenosis: Secondary | ICD-10-CM

## 2018-08-29 DIAGNOSIS — I5033 Acute on chronic diastolic (congestive) heart failure: Secondary | ICD-10-CM

## 2018-08-29 DIAGNOSIS — I480 Paroxysmal atrial fibrillation: Secondary | ICD-10-CM

## 2018-08-29 DIAGNOSIS — R0609 Other forms of dyspnea: Secondary | ICD-10-CM

## 2018-08-29 DIAGNOSIS — R0600 Dyspnea, unspecified: Secondary | ICD-10-CM

## 2018-08-29 LAB — ECHOCARDIOGRAM COMPLETE
Height: 64 in
Weight: 4232 oz

## 2018-08-29 LAB — BASIC METABOLIC PANEL
Anion gap: 12 (ref 5–15)
BUN: 68 mg/dL — ABNORMAL HIGH (ref 6–20)
CO2: 21 mmol/L — ABNORMAL LOW (ref 22–32)
Calcium: 8.9 mg/dL (ref 8.9–10.3)
Chloride: 102 mmol/L (ref 98–111)
Creatinine, Ser: 3.79 mg/dL — ABNORMAL HIGH (ref 0.44–1.00)
GFR calc Af Amer: 15 mL/min — ABNORMAL LOW (ref >60)
GFR calc non Af Amer: 13 mL/min — ABNORMAL LOW (ref >60)
Glucose, Bld: 101 mg/dL — ABNORMAL HIGH (ref 70–99)
Potassium: 4.5 mmol/L (ref 3.5–5.1)
Sodium: 135 mmol/L (ref 135–145)

## 2018-08-29 LAB — URINE CULTURE
Culture: >=100000 — AB
Special Requests: NORMAL

## 2018-08-29 LAB — CBC
HCT: 27.4 % — ABNORMAL LOW (ref 36.0–46.0)
Hemoglobin: 8.7 g/dL — ABNORMAL LOW (ref 12.0–15.0)
MCH: 26.9 pg (ref 26.0–34.0)
MCHC: 31.8 g/dL (ref 30.0–36.0)
MCV: 84.8 fL (ref 80.0–100.0)
Platelets: 227 10*3/uL (ref 150–400)
RBC: 3.23 MIL/uL — ABNORMAL LOW (ref 3.87–5.11)
RDW: 16.9 % — ABNORMAL HIGH (ref 11.5–15.5)
WBC: 15.3 10*3/uL — ABNORMAL HIGH (ref 4.0–10.5)
nRBC: 0.3 % — ABNORMAL HIGH (ref 0.0–0.2)

## 2018-08-29 LAB — PROTIME-INR
INR: 4 — ABNORMAL HIGH (ref 0.8–1.2)
Prothrombin Time: 38.7 seconds — ABNORMAL HIGH (ref 11.4–15.2)

## 2018-08-29 LAB — MAGNESIUM: Magnesium: 2.6 mg/dL — ABNORMAL HIGH (ref 1.7–2.4)

## 2018-08-29 MED ORDER — PERFLUTREN LIPID MICROSPHERE
1.0000 mL | INTRAVENOUS | Status: AC | PRN
  Administered 2018-08-29: 3 mL via INTRAVENOUS
  Filled 2018-08-29: qty 10

## 2018-08-29 MED ORDER — METOPROLOL SUCCINATE ER 50 MG PO TB24
50.0000 mg | ORAL_TABLET | Freq: Every day | ORAL | Status: DC
  Administered 2018-08-29 – 2018-08-30 (×2): 50 mg via ORAL
  Filled 2018-08-29 (×2): qty 1

## 2018-08-29 MED ORDER — DEXTROSE 5 % IV SOLN
0.5000 g | Freq: Two times a day (BID) | INTRAVENOUS | Status: DC
  Administered 2018-08-29 (×2): 0.5 g via INTRAVENOUS
  Filled 2018-08-29 (×3): qty 5

## 2018-08-29 NOTE — Consult Note (Signed)
Cardiology Consultation:   Patient ID: Leslie [name] MRN: [id number]; DOB: 1965-04-03  Admit date: 08/26/2018 Date of Consult: 08/29/2018  Primary Care Provider: Ronnell Freshwater, NP Primary Cardiologist: Ida Rogue, MD  Primary Electrophysiologist:  None    Patient Profile:   Leslie [name] is a 53 y.o. female with a hx of nonobstructive CAD by cardiac CT 02/2018, PAF on Coumadin, chronic diastolic CHF, moderate mitral stenosis, HTN, HLD, tobacco abuse and COPD, obesity, sleep apnea, and anxiety who is being seen today for the evaluation of progressive shortness of breath at the request of Dr. Bridgett Larsson.  History of Present Illness:   Leslie [name] is a 53 year old female with PMH as above. In 08/2016, she was diagnosed with Afib, at which time she quit smoking. She subsequently underwent TEE which showed moderate MS. She was cardioverted and anticoagulated on Coumadin in the setting of valvular Afib. 12/2017, she awoke with tachy-palpitations and rates into the 130s but was found to be in NSR on EKG once EMS arrived. Diltiazem was increased per her cardiologist and cardiac CT ordered, which showed  distal left main 0 to 25% calcification, 0 to 25% LAD calcification, trivial plaque within the LCx.  She had a calcium score of 10, which was the 89th percentile for age and sex.  The study also showed dilated pulmonary artery measuring 35 mm, suggestive of pulmonary hypertension.  Lipitor was increased to 40 mg daily.  In late 12/2017, she suffered a mechanical fall after tripping down a step with subsequent large hematoma along the left buttock and left lateral thigh with plans for subsequent knee surgery (not yet performed).  ZIO monitor 01/2018 showed normal sinus rhythm with an average heart rate of 82 bpm, 5 SVT/atrial tachycardia events with the fastest interval left during 5 beats (max 145 bpm) and longest 16 beats (avg 114 bpm).  No clear evidence of atrial fibrillation. She was maintaining NSR 03/2018  when seen again in the office and on diltiazem and metoprolol succinate without any breakthrough symptoms. She was prescribed PRN lasix for worsening leg edema at that time.  She reported stable orthopnea at ~2 pillows.  In 08/15/2018, she called the office for estimated 12 pound weight gain in 1 month. She was subsequently seen in the office with documented 18 pound weight gain since her 04/10/2018 visit.  She reported increased lower extremity edema, despite the increase in her Lasix to 20 mg twice daily over the last 2 weeks.  Her lower extremity edema was primarily in her ankles and feet and would resolve in the morning then progressively worsen throughout the day.  She did not wear compression stockings.  She also noted that she attempted to watch what she ate but would occasionally cheat sometimes.  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  She also noted intermittent chest pain with no clear triggers and not associated with any other symptoms. She had recently been on 1 month of abx for URI. Recommendations were for continued elevation of lower extremities, compression stockings, and to continue Lasix 20 mg twice daily.  Repeat echocardiogram was recommended for mitral stenosis.    On 08/18/2018, she underwent R ureter stent placement for R kidney stone with subsequent flank pain for approximately 1 week despite initial prescription for Toradol. This pain lasted well after the stent removal on 8/2. Despite taking ibuprofen, as instructed by the office, she continued to  experience flank pain and progressive SOB. She then presented to urgent care with hematuria and dysuria, at which time she was diagnosed with a UTI and prescribed tamulosin and Bactrim. She continued to have pain and SOB, also now noticing racing HR and intermittent palpitations. She reported heart rate over 100bpm at home, despite taking higher doses  of metoprolol.  She reportedly was also taking high doses of ibuprofen and naproxen without relief. She continued to take 2 lasix daily for LEE and SOB.   On 8/8, she  presented to [facility] ED for continued flank pain and associated nausea, emesis, weakness, fatigue, reduced appetite, subjective chills, and worsening shortness of breath / DOE.  Original vitals showed sinus tachycardia at 110bpm, T 99.58F, BP 140/78, and SpO2 90%. Wt 223 lbs. She was started on fluids and abx and admitted for AKI and sepsis for presumed pyelonephritis.   Heart Pathway Score:     Past Medical History:  Diagnosis Date   (HFpEF) heart failure with preserved ejection fraction ([place])    a. 08/2017 Echo: EF 55-60%.  Grade 2 diastolic dysfunction.  Moderate mitral stenosis.   Allergy    Anemia    Anxiety    BRCA negative 03/22/2013   Bronchitis 02/19/2016   ON LEVAQUIN PO   Chest tightness    CHF (congestive heart failure) (HCC)    Cigarette smoker 09/11/2017   8-10 day   GERD (gastroesophageal reflux disease)    History of kidney stones    Hyperlipidemia    Hypertension    Interstitial lung disease ([place])    a. CT 2013 b. 02/2018 CXR noted recurrent intersistial changes ILD vs chronic bronchitis   Moderate mitral stenosis    a.  08/2017 TEE: EF 60 to 65%.  Moderate mitral stenosis.  Mean gradient 14 mmHg.  Valve area 2.59 cm by planimetry, 2.72 cm by pressure half-time.   Palpitations    Persistent atrial fibrillation    a. 08/2017 s/p TEE/DCCV; b. CHA2DS2VASc = 2-->warfarin.    Past Surgical History:  Procedure Laterality Date   ABDOMINAL HYSTERECTOMY     total   ABDOMINAL HYSTERECTOMY     BREAST BIOPSY Bilateral 2012   BREAST BIOPSY Right 08-07-12   fibroadenomatous changes and columnar cells   BREAST BIOPSY  02/03/2015   stereo byrnett   CHOLECYSTECTOMY N/A 02/27/2016   Procedure: LAPAROSCOPIC CHOLECYSTECTOMY;  Surgeon: Christene Lye, MD;  Location: [facility] ORS;  Service:  General;  Laterality: N/A;   CYSTOSCOPY W/ RETROGRADES Right 08/18/2018   Procedure: CYSTOSCOPY WITH RETROGRADE PYELOGRAM;  Surgeon: Billey Co, MD;  Location: [facility] ORS;  Service: Urology;  Laterality: Right;   CYSTOSCOPY/URETEROSCOPY/HOLMIUM LASER/STENT PLACEMENT Right 08/18/2018   Procedure: CYSTOSCOPY/URETEROSCOPY/STENT PLACEMENT;  Surgeon: Billey Co, MD;  Location: [facility] ORS;  Service: Urology;  Laterality: Right;   JOINT REPLACEMENT Left    KNEE CLOSED REDUCTION Left 04/15/2015   Procedure: CLOSED MANIPULATION KNEE;  Surgeon: Thornton Park, MD;  Location: [facility] ORS;  Service: Orthopedics;  Laterality: Left;   KNEE SURGERY     TEE WITHOUT CARDIOVERSION N/A 09/13/2017   Procedure: TRANSESOPHAGEAL ECHOCARDIOGRAM (TEE);  Surgeon: Nelva Bush, MD;  Location: [facility] ORS;  Service: Cardiovascular;  Laterality: N/A;   TOTAL KNEE ARTHROPLASTY Left 12/25/2014   Procedure: TOTAL KNEE ARTHROPLASTY;  Surgeon: Thornton Park, MD;  Location: [facility] ORS;  Service: Orthopedics;  Laterality: Left;   TUBAL LIGATION       Home Medications:  Prior to Admission medications   Medication Sig Start Date  End Date Taking? Authorizing Provider  albuterol (VENTOLIN HFA) 108 (90 Base) MCG/ACT inhaler Inhale 2 puffs into the lungs every 4 (four) hours as needed for wheezing or shortness of breath. 06/16/18  Yes Boscia, Greer Ee, NP  ALPRAZolam (XANAX) 0.5 MG tablet Take 1 tablet (0.5 mg total) by mouth at bedtime as needed for anxiety. 06/19/18  Yes Ronnell Freshwater, NP  atorvastatin (LIPITOR) 40 MG tablet Take 1 tablet (40 mg total) by mouth daily at 6 PM. 02/28/18  Yes Theora Gianotti, NP  celecoxib (CELEBREX) 200 MG capsule Take 200 mg by mouth at bedtime as needed for mild pain.  06/19/18  Yes [provider]  estradiol (ESTRACE) 0.5 MG tablet Take 1 tablet (0.5 mg total) by mouth daily. 08/04/18  Yes Boscia, Greer Ee, NP  metoprolol succinate (TOPROL-XL) 25 MG 24 hr tablet Take 1  tablet (25 mg total) by mouth daily. Take with or immediately following a meal. 06/15/18  Yes Gollan, Kathlene November, MD  montelukast (SINGULAIR) 10 MG tablet Take 1 tablet (10 mg total) by mouth at bedtime. 04/18/18  Yes Boscia, Greer Ee, NP  naproxen sodium (ALEVE) 220 MG tablet Take 440 mg by mouth daily as needed (pain).   Yes [provider]  omeprazole (PRILOSEC) 40 MG capsule Take 1 capsule (40 mg total) by mouth daily. 06/22/18  Yes Boscia, Greer Ee, NP  potassium chloride (K-DUR) 10 MEQ tablet Take 2 tablets (20 meq) by mouth once daily Patient taking differently: Take 20 mEq by mouth daily.  04/10/18  Yes Minna Merritts, MD  sulfamethoxazole-trimethoprim (BACTRIM DS) 800-160 MG tablet Take 1 tablet by mouth daily. 08/18/18  Yes Billey Co, MD  tamsulosin (FLOMAX) 0.4 MG CAPS capsule Take 0.4 mg by mouth every evening.    Yes [provider]  warfarin (COUMADIN) 2.5 MG tablet TAKE 1/2 TO 1 TABLET DAILY AS DIRECTED BY COUMADIN CLINIC. Patient taking differently: Take 1.25-2.5 mg by mouth See admin instructions. Take 2.5 mg at night on Mon, Wed, and Fri.  Take 1.25 mg at night on Sun, Tue, Thur, and Sat 04/07/18  Yes Gollan, Kathlene November, MD  chlorpheniramine-HYDROcodone [facility] PENNKINETIC ER) 10-8 MG/5ML SUER Take 5 mLs by mouth every 12 (twelve) hours as needed for cough. Patient not taking: Reported on 08/26/2018 07/03/18   Ronnell Freshwater, NP  diltiazem (CARDIZEM CD) 240 MG 24 hr capsule TAKE 1 CAPSULE DAILY 08/28/18   Minna Merritts, MD  furosemide (LASIX) 20 MG tablet TAKE 1 tablet by mouth daily, may take additional 20 mg tablet for swelling. 08/28/18   Minna Merritts, MD    Inpatient Medications: Scheduled Meds:  atorvastatin  40 mg Oral q1800   diltiazem  240 mg Oral Daily   docusate sodium  100 mg Oral BID   metoprolol succinate  25 mg Oral Daily   montelukast  10 mg Oral QHS   pantoprazole  40 mg Oral Daily   patiromer  16.8 g Oral Daily    polyethylene glycol  17 g Oral Daily   tamsulosin  0.4 mg Oral QPM   Warfarin - Pharmacist Dosing Inpatient   Does not apply q1800   Continuous Infusions:  sodium chloride Stopped (08/28/18 2100)   meropenem (MERREM) IV Stopped (08/28/18 2133)   PRN Meds: acetaminophen **OR** acetaminophen, ALPRAZolam, bisacodyl, HYDROcodone-acetaminophen, ipratropium-albuterol, ondansetron **OR** ondansetron (ZOFRAN) IV, senna  Allergies:    Allergies  Allergen Reactions   Morphine Hives and Rash   Oxycodone Hcl Hives  and Itching   Dilaudid [Hydromorphone Hcl] Itching    Social History:   Social History   Socioeconomic History   Marital status: Married    Spouse name: Not on file   Number of children: Not on file   Years of education: Not on file   Highest education level: Not on file  Occupational History   Not on file  Social Needs   Financial resource strain: Not on file   Food insecurity    Worry: Not on file    Inability: Not on file   Transportation needs    Medical: Not on file    Non-medical: Not on file  Tobacco Use   Smoking status: Former Smoker    Packs/day: 0.50    Years: 20.00    Pack years: 10.00    Types: Cigarettes    Quit date: 02/20/2018    Years since quitting: 0.5   Smokeless tobacco: Never Used   Tobacco comment: down to 2-3 daily  Substance and Sexual Activity   Alcohol use: Yes    Alcohol/week: 2.0 standard drinks    Types: 2 Glasses of wine per week    Comment: wine occ   Drug use: No   Sexual activity: Not on file  Lifestyle   Physical activity    Days per week: Not on file    Minutes per session: Not on file   Stress: Not on file  Relationships   Social connections    Talks on phone: Not on file    Gets together: Not on file    Attends religious service: Not on file    Active member of club or organization: Not on file    Attends meetings of clubs or organizations: Not on file    Relationship status: Not on file    Intimate partner violence    Fear of current or ex partner: Not on file    Emotionally abused: Not on file    Physically abused: Not on file    Forced sexual activity: Not on file  Other Topics Concern   Not on file  Social History Narrative   Not on file    Family History:    Family History  Problem Relation Age of Onset   Cancer Mother 76       breast   Cancer Maternal Aunt        breast   Cancer Maternal Grandmother        breast     ROS:  Please see the history of present illness.  Review of Systems  Constitutional: Positive for chills and malaise/fatigue. Negative for diaphoresis.       Subjective chills reported prior to admission.  Respiratory: Positive for shortness of breath.        Progressive shortness of breath and dyspnea on exertion.  Does not use oxygen at home.  Cardiovascular: Positive for chest pain, palpitations, orthopnea and leg swelling.       Chest tightness noted.  Orthopnea increased from her stable two-pillow orthopnea. Lower extremity edema increased from her usual baseline dependent edema and not relieved with 2 lasix daily.  Pedal edema noted. Increasingly noted palpitations and faster heart rate.   Gastrointestinal: Positive for abdominal pain, nausea and vomiting.       Nausea and emesis prior to admission.  Genitourinary: Positive for dysuria, flank pain and hematuria.       Hematuria noted prior to admission.  Musculoskeletal: Positive for back pain.  Neurological: Positive for  weakness.  All other systems reviewed and are negative.   All other ROS reviewed and negative.     Physical Exam/Data:   Vitals:   08/28/18 0420 08/28/18 1145 08/28/18 2007 08/29/18 0408  BP: 123/67 136/83 134/77 (!) 150/69  Pulse: 96 99 94 (!) 103  Resp: _0 Temp: 98.3 F (36.8 C)  98.4 F (36.9 C) 98.5 F (36.9 C)  TempSrc: Oral  Oral Oral  SpO2: 99% 94% 97% 96%  Weight:      Height:        Intake/Output Summary (Last 24 hours) at  08/29/2018 0741 Last data filed at 08/29/2018 0648 Gross per 24 hour  Intake 1017.98 ml  Output 1250 ml  Net -232.02 ml   Last 3 Weights 08/26/2018 08/26/2018 08/18/2018  Weight (lbs) 258 lb 2.5 oz 223 lb 262 lb 5.6 oz  Weight (kg) 117.1 kg 101.152 kg 119 kg     Body mass index is 44.31 kg/m.  General: Obese female, notably short of breath on exam. HEENT: normal.  On nasal cannula oxygen. Neck: JVD difficult to assess due to body habitus. Vascular: Radial pulses 2+ bilaterally Cardiac: Tachycardic but regular, normal S1, S2; diastolic 2/6 murmur noted Lungs: Patient discomfort with deep inspiration noted, bilateral crackles, bibasilar reduced breath sounds; no wheezing Abd: Distended, diffuse tenderness, no hepatomegaly  Ext: 1+ bilateral pitting edema  Musculoskeletal:  BUE and BLE strength normal and equal Skin: warm and dry  Neuro:  No focal abnormalities noted Psych:  Normal affect   EKG:  The EKG was personally reviewed and demonstrates: Sinus tachycardia, 3 beat PVCs versus NSVT  Telemetry:  Telemetry was personally reviewed and demonstrates:  Sinus tachycardia, 100 bpm, IVCD with QRS 104 ms, poor R wave progression, baseline wander  Relevant CV Studies:  Pending updated echo  09/13/2017 TEE Study Conclusions - Left ventricle: The cavity size was normal. Systolic function was   normal. The estimated ejection fraction was in the range of 60%   to 65%. - Aortic valve: No evidence of vegetation. There was trivial   regurgitation. - Mitral valve: Mobility of the posterior leaflet was restricted.   No evidence of vegetation. Transvalvular velocity was increased.   The findings are consistent with moderate stenosis. Mean gradient   (D): 14 mm Hg. Planimetered valve area: 2.59 cm^2. Valve area by   pressure half-time: 2.72 cm^2. Valve area (PISA): 1.12 cm^2. - Left atrium: No evidence of thrombus in the atrial cavity or   appendage. - Right ventricle: The cavity size was normal.  Systolic function   was normal. - Right atrium: No evidence of thrombus in the atrial cavity or   appendage. - Atrial septum: Doppler showed no atrial level shunt, in the   baseline state. - Tricuspid valve: No evidence of vegetation. - Pulmonic valve: No evidence of vegetation.  09/12/2017 Echo Study Conclusions - Left ventricle: The cavity size was normal. Wall thickness was   normal. Systolic function was normal. The estimated ejection   fraction was in the range of 55% to 60%. Wall motion was normal;   there were no regional wall motion abnormalities. Features are   consistent with a pseudonormal left ventricular filling pattern,   with concomitant abnormal relaxation and increased filling   pressure (grade 2 diastolic dysfunction). - Mitral valve: Poorly visualized. The findings are consistent with   moderate stenosis. Mean gradient (D): 15 mm Hg. Valve area by   pressure half-time: 1.62 cm^2. Valve area by  continuity equation   (using LVOT flow): 2.15 cm^2. Impressions: - Significantly elevated mitral valve gradient but the valve is not   well visualized. Left atrial size is normal which argues against   severe stenosis. Consider a TEE if clinically indicated.  Laboratory Data:  High Sensitivity Troponin:  No results for input(s): TROPONINIHS in the last 720 hours.   Cardiac EnzymesNo results for input(s): TROPONINI in the last 168 hours. No results for input(s): TROPIPOC in the last 168 hours.  Chemistry Recent Labs  Lab 08/27/18 0420 08/28/18 0554 08/29/18 0608  NA 135 133* 135  K 5.6* 4.3 4.5  CL 104 101 102  CO2 21* 21* 21*  GLUCOSE 108* 93 101*  BUN 42* 58* 68*  CREATININE 3.24* 4.10* 3.79*  CALCIUM 8.1* 8.2* 8.9  GFRNONAA 16* 12* 13*  GFRAA 18* 14* 15*  ANIONGAP _0 No results for input(s): PROT, ALBUMIN, AST, ALT, ALKPHOS, BILITOT in the last 168 hours. Hematology Recent Labs  Lab 08/27/18 0420 08/28/18 0554 08/29/18 0608  WBC 16.8* 15.3*  15.3*  RBC 2.62* 3.10* 3.23*  HGB 6.9* 8.3* 8.7*  HCT 22.4* 25.9* 27.4*  MCV 85.5 83.5 84.8  MCH 26.3 26.8 26.9  MCHC 30.8 32.0 31.8  RDW 16.9* 16.9* 16.9*  PLT 239 220 227   BNP Recent Labs  Lab 08/26/18 1221 08/26/18 2024  BNP 479.0* 539.0*    DDimer No results for input(s): DDIMER in the last 168 hours.   Radiology/Studies:  Dg Chest 2 View  Result Date: 08/26/2018 CLINICAL DATA:  Former smoker. Shortness of breath. History of interstitial lung disease. EXAM: CHEST - 2 VIEW COMPARISON:  March 16, 2018 FINDINGS: No pneumothorax. Increasing bilateral pulmonary opacities, particularly in the bases, worsened in the interval. The heart size borderline. The hila and mediastinum are unremarkable. IMPRESSION: The findings may represent atypical infection versus pulmonary edema. Recommend clinical correlation. Electronically Signed   By: Dorise Bullion III M.D   On: 08/26/2018 15:51   Ct Chest Wo Contrast  Result Date: 08/26/2018 CLINICAL DATA:  Shortness of breath EXAM: CT CHEST WITHOUT CONTRAST TECHNIQUE: Multidetector CT imaging of the chest was performed following the standard protocol without IV contrast. COMPARISON:  None. FINDINGS: Cardiovascular: The heart size is mildly enlarged. Aortic calcifications are noted. The main pulmonary artery is dilated measuring up to approximately 3.3 cm in diameter. There is no significant pericardial effusion. Mediastinum/Nodes: --there prominent mediastinal and hilar lymph nodes bilaterally. --No axillary lymphadenopathy. --No supraclavicular lymphadenopathy. --Normal thyroid gland. --The esophagus is unremarkable Lungs/Pleura: There are small bilateral pleural effusions. The lung volumes are low. There is a mosaic appearance of the lung fields bilaterally with scattered areas of ground-glass opacification. There are more focal opacities at the lung bases favored to represent atelectasis. There is interlobular septal thickening. There is no  pneumothorax. Upper Abdomen: No acute abnormality. Musculoskeletal: No chest wall abnormality. No acute or significant osseous findings. Review of the MIP images confirms the above findings. IMPRESSION: 1. Overall findings concerning for developing pulmonary edema. An atypical infectious process is not entirely excluded and should be correlated clinically. 2. Small bilateral pleural effusions. 3. Mediastinal and hilar adenopathy, presumably reactive in etiology. 4. Low lung volumes. 5. Dilated main pulmonary artery which can be seen in patients with elevated PA pressures. Aortic Atherosclerosis (ICD10-I70.0). Electronically Signed   By: Constance Holster M.D.   On: 08/26/2018 18:55   US Renal  Result Date: 08/28/2018 CLINICAL DATA:  Acute renal failure. EXAM:  RENAL / URINARY TRACT ULTRASOUND COMPLETE COMPARISON:  Abdominal CT 08/26/2018. FINDINGS: Right Kidney: Renal measurements: 12.2 x 6.2 x 6.5 cm = volume: 257 mL. There is increased cortical echogenicity. There is a cyst in the upper pole measuring 2.8 cm maximally. A shadowing calculus is also present in the upper pole. No hydronephrosis. Left Kidney: Renal measurements: 9.8 x 7.5 x 6.3 cm = volume: 241 mL. Increased cortical echogenicity with preserved cortical thickness. No hydronephrosis. Bladder: Appears normal for degree of bladder distention. IMPRESSION: 1. Both kidneys demonstrate increased cortical echogenicity as can be seen with medical renal disease. 2. No hydronephrosis.  Right renal cyst and calculus again noted. Electronically Signed   By: Richardean Sale M.D.   On: 08/28/2018 11:30   Ct Renal Stone Study  Result Date: 08/26/2018 CLINICAL DATA:  Flank pain question recurrent stone disease EXAM: CT ABDOMEN AND PELVIS WITHOUT CONTRAST TECHNIQUE: Multidetector CT imaging of the abdomen and pelvis was performed following the standard protocol without IV contrast. Sagittal and coronal MPR images reconstructed from axial data set. Oral contrast  not administered for this indication COMPARISON:  07/18/2018 FINDINGS: Lower chest: Small BILATERAL pleural effusions and mild bibasilar atelectasis Hepatobiliary: Gallbladder surgically absent.  Liver unremarkable. Pancreas: Normal appearance Spleen: Normal appearance Adrenals/Urinary Tract: Adrenal glands normal appearance. LEFT kidney normal appearance. RIGHT kidney enlarged with diffuse perinephric edema. Mild dilatation of RIGHT renal collecting system and RIGHT ureter. Bladder unremarkable. No ureteral calcification identified. Again seen 8 mm calculus at mid RIGHT kidney, nonobstructing. Again seen cyst at upper pole of RIGHT kidney, 3.0 x 2.9 cm, stable. Stomach/Bowel: Normal appendix. Stomach and bowel loops normal appearance Vascular/Lymphatic: Atherosclerotic calcifications aorta and iliac arteries without aneurysm. No adenopathy. Reproductive: Uterus surgically absent with nonvisualization of ovaries Other: No free air or free fluid.  No hernia. Musculoskeletal: No acute osseous findings. IMPRESSION: Enlargement of RIGHT kidney and perinephric edema with dilatation of RIGHT renal collecting system and RIGHT ureter. No obstructing ureteral calculus identified. Findings are most suspicious for a passed calculus though infection/pyelonephritis not completely excluded; recommend correlation with urinalysis. Electronically Signed   By: Lavonia Dana M.D.   On: 08/26/2018 15:35    Assessment and Plan:   Diastolic heart failure --Significant shortness of breath on nasal cannula and after receiving intravenous fluids for pyelonephritis in the emergency department.  Likely multifactorial in the setting of known mitral stenosis with current elevated HR and BP in the setting of infection and pain and s/p fluids due to worsening renal function after R ureter stent /renal stone UTI with multiple renally processed medications, including ibuprofen and bactrim. --Volume overloaded on exam.  --Recommend daily  standing weights.  Document I's/O's.  --Defer diuresis for now, given renal function. Focus on optimizing HR and BP control in the setting of known mitral stenosis as below. Will plan for diuresis with recovery of renal function and once HR and BP more optimal. --Avoid fluids, as these are likely contributing to volume overload and worsening SOB and not ideal in the setting of her mitral stenosis with diastolic heart failure and volume overload.  --Obtain updated echo to reassess structure, EF, and valvular disease. Further recommendations following echo.  Mitral valve stenosis, moderate --Likely contributing to SOB. --Agree with echo, as need to reassess the severity of mitral stenosis --Maintain low heart rate and blood pressure as above.  Avoid further fluids. --Increased beta-blocker to Toprol 42m daily. Continue Cardizem 2443mdaily.   Atrial fibrillation s/p DCCV Palpitations --Maintaining SR. Did note tachy-palpitations  in the setting of worsening infection as in HPI.  On PTA warfarin for valvular Afib with no recent evidence of recurrence as above. --Continue rate control as above with BB and Cardizem for tachypalpitations. --Continue warfarin per pharmacy.  --Monitor INR. Daily CBC. Monitor electrolytes with current K 4.5. Check Mg. 8/8 TSH 0.864.  Acute on chronic kidney disease --As above, worsening renal function in the setting of recent ureter stent/renal stone with subsequent UTI and multiple renally processed medications with volume overload. --Avoid nephrotoxic agents at this time.  Renally dose medications.  Avoid contrast. --Daily BMET. Cr 3.79 (improved from 4.10) and with baseline estimated at Cr 1.1-1.2.   HTN --Continue medical management. BP sub-optimal at this time with SBP 150s and control stressed due to mitral stenosis.  HLD --Continue statin therapy. 03/2018 LDL 109 and could consider up titration of statin as outpatient.   CAD --Chest tightness noted on exam  today. Cardiac CTA 02/2018 as above. --Will continue to monitor, given recent office visit with c/o stabbing chest pain below her R breast at rest, though thought to be atypical and etiology likely due to volume overload verus MSK per anxiety per documentation.  --EKG at admission without acute changes.  --Chest tightness likely in the setting of volume overload and work with breathing.   --Invasive ischemic workup not indicated at this time unless further CP or  EF significantly reduced or acute structural changes noted on echo.   Anemia --Hgb 8.7. Monitor.  Recommend transfusion below 8. Appears as if Hgb steadily dropping since ~2016. Further workup if needed per IM.   For questions or updates, please contact [place] Please consult [email or web address] for contact info under     Signed, Arvil Chaco, PA-C  08/29/2018 7:41 AM

## 2018-08-29 NOTE — Progress Notes (Signed)
[facility] for Warfarin  Indication: atrial fibrillation  Patient Measurements: Height: '5\' 4"'  (162.6 cm) Weight: 258 lb 2.5 oz (117.1 kg) IBW/kg (Calculated) : 54.7  Vital Signs: Temp: 98.5 F (36.9 C) (08/11 0408) Temp Source: Oral (08/11 0408) BP: 150/69 (08/11 0408) Pulse Rate: 103 (08/11 0408)  Labs: Recent Labs    08/27/18 0420 08/28/18 0554 08/29/18 0608  HGB 6.9* 8.3* 8.7*  HCT 22.4* 25.9* 27.4*  PLT 239 220 227  LABPROT 41.8* 47.0* 38.7*  INR 4.5* 5.2* 4.0*  CREATININE 3.24* 4.10* 3.79*    Estimated Creatinine Clearance: 21.8 mL/min (A) (by C-G formula based on SCr of 3.79 mg/dL (H)).   Medical History: Past Medical History:  Diagnosis Date  . (HFpEF) heart failure with preserved ejection fraction ([place])    a. 08/2017 Echo: EF 55-60%.  Grade 2 diastolic dysfunction.  Moderate mitral stenosis.  . Allergy   . Anemia   . Anxiety   . BRCA negative 03/22/2013  . Bronchitis 02/19/2016   ON LEVAQUIN PO  . Chest tightness   . CHF (congestive heart failure) ([place])   . Cigarette smoker 09/11/2017   8-10 day  . GERD (gastroesophageal reflux disease)   . History of kidney stones   . Hyperlipidemia   . Hypertension   . Interstitial lung disease ([place])    a. CT 2013 b. 02/2018 CXR noted recurrent intersistial changes ILD vs chronic bronchitis  . Moderate mitral stenosis    a.  08/2017 TEE: EF 60 to 65%.  Moderate mitral stenosis.  Mean gradient 14 mmHg.  Valve area 2.59 cm by planimetry, 2.72 cm by pressure half-time.  . Palpitations   . Persistent atrial fibrillation    a. 08/2017 s/p TEE/DCCV; b. CHA2DS2VASc = 2-->warfarin.    Medications:  Medications Prior to Admission  Medication Sig Dispense Refill Last Dose  . albuterol (VENTOLIN HFA) 108 (90 Base) MCG/ACT inhaler Inhale 2 puffs into the lungs every 4 (four) hours as needed for wheezing or shortness of breath. 3 Inhaler 0 Unknown at PRN  . ALPRAZolam (XANAX) 0.5 MG  tablet Take 1 tablet (0.5 mg total) by mouth at bedtime as needed for anxiety. 30 tablet 2 Unknown at PRN  . atorvastatin (LIPITOR) 40 MG tablet Take 1 tablet (40 mg total) by mouth daily at 6 PM. 90 tablet 3 08/25/2018 at 1800  . celecoxib (CELEBREX) 200 MG capsule Take 200 mg by mouth at bedtime as needed for mild pain.    Unknown at PRN  . estradiol (ESTRACE) 0.5 MG tablet Take 1 tablet (0.5 mg total) by mouth daily. 90 tablet 1 08/26/2018 at 0730  . metoprolol succinate (TOPROL-XL) 25 MG 24 hr tablet Take 1 tablet (25 mg total) by mouth daily. Take with or immediately following a meal. 90 tablet 0 08/26/2018 at 0730  . montelukast (SINGULAIR) 10 MG tablet Take 1 tablet (10 mg total) by mouth at bedtime. 90 tablet 2 08/25/2018 at 2100  . naproxen sodium (ALEVE) 220 MG tablet Take 440 mg by mouth daily as needed (pain).   Unknown at PRN  . omeprazole (PRILOSEC) 40 MG capsule Take 1 capsule (40 mg total) by mouth daily. 90 capsule 1 08/26/2018 at 0730  . potassium chloride (K-DUR) 10 MEQ tablet Take 2 tablets (20 meq) by mouth once daily (Patient taking differently: Take 20 mEq by mouth daily. ) 180 tablet 2 08/25/2018 at 0700  . sulfamethoxazole-trimethoprim (BACTRIM DS) 800-160 MG tablet Take 1 tablet by mouth  daily. 4 tablet 0 08/25/2018 at 1800  . tamsulosin (FLOMAX) 0.4 MG CAPS capsule Take 0.4 mg by mouth every evening.    08/25/2018 at 1800  . warfarin (COUMADIN) 2.5 MG tablet TAKE 1/2 TO 1 TABLET DAILY AS DIRECTED BY COUMADIN CLINIC. (Patient taking differently: Take 1.25-2.5 mg by mouth See admin instructions. Take 2.5 mg at night on Mon, Wed, and Fri.  Take 1.25 mg at night on Sun, Tue, Thur, and Sat) 90 tablet 1 08/25/2018 at 2100  . chlorpheniramine-HYDROcodone (TUSSIONEX PENNKINETIC ER) 10-8 MG/5ML SUER Take 5 mLs by mouth every 12 (twelve) hours as needed for cough. (Patient not taking: Reported on 08/26/2018) 115 mL 0 Not Taking at Unknown time    Assessment: Pharmacy consulted to dose warfarin in this  53 year old female with Afib.  Pt was on Warfarin 2.5 mg PO MWF and warfarin 1.25 mg PO T-Th-Sat-Sun. DDIs include meropenem, Lokelma and Bactrim. It appears she was on Bactrim PTA s/p R ureteral stent ~1 week ago.    H&H slightly up from yesterday. Patient is s/p 1 unit PRBC on 8/9.   Date  INR  Dose 8/8   2.6    1.25 mg 8/9  4.5    HOLD  8/10  5.2  HOLD 8/11  4.0  HOLD  Goal of Therapy:  INR 2-3   Plan:   Hold Warfarin dose today   recheck INR with AM labs.   [employer] Resident 08/29/2018,9:40 AM

## 2018-08-29 NOTE — Evaluation (Signed)
Physical Therapy Evaluation Patient Details Name: Leslie [name] MRN: [id number] DOB: [birth year] Today's Date: 08/29/2018   History of Present Illness  53 y.o. female with a known history of persistent atrial fibrillation status post TEE cardioversion on Coumadin, hypertension, history of interstitial lung disease, diastolic heart failure, GERD, obesity and kidney stones presented to hospital secondary to worsening abdominal pain, nausea, shortness of breath and dysuria. Work up for sepsis likely due to acute pyelonephritis; stent removal 1 week ago and acute renal failure. Pt  s/p transfusion during hospital stay as well. INR 4.    Clinical Impression  Patient sleeping upon PT entering room, easily woken. Pt exhibited lethargy throughout session, fell asleep several times while speaking, difficulty keeping eyes open. Patient stated she lives with her husband and adult daughter in a one story home, 4 steps to enter. Previously needed assistance with ADLs, ambulated without an assistive device.   Patient demonstrated supine to sit with minA for trunk elevation and heavy use of grab bars. Pt was able to sit EOB with fair balance. Sit <> stand with minA and use of bed rails/handheld support, several attempts to clear hips from the bed. Ambulated ~2-43ft towards recliner in room reliant on UE support, exhibited staggering gait and overall unsteadiness and fatigue. Further mobility held due to safety concerns. HR and spO2 monitored, HR up in 110s-120s with mobility, spO2 readings intermittently unclear on 2L via [place].  Overall the patient demonstrated deficits (see "PT Problem List") that impede the patient's functional abilities, safety, and mobility and would benefit from skilled PT intervention. Recommendation is STR pending pt progress.     Follow Up Recommendations SNF    Equipment Recommendations  Rolling walker with 5" wheels    Recommendations for Other Services       Precautions / Restrictions  Precautions Precautions: Fall Restrictions Weight Bearing Restrictions: No      Mobility  Bed Mobility Overal bed mobility: Needs Assistance Bed Mobility: Supine to Sit     Supine to sit: Min assist     General bed mobility comments: minA from handheld assist for complete trunk elevation  Transfers Overall transfer level: Needs assistance Equipment used: 1 person hand held assist Transfers: Sit to/from Stand Sit to Stand: Min assist         General transfer comment: Pt needed to make several attempts in order to clear buttocks from bed, minA. Bilateral UE support (Bed rail, handheld assist)  Ambulation/Gait Ambulation/Gait assistance: Min guard Gait Distance (Feet): 3 Feet Assistive device: None       General Gait Details: Pt with staggering during ambulation, overall appeared very unsteady, reaches for support. Fatigued quickly. Pt also with elevated HR during exertional activity, unclear spO2 readings  Stairs            Wheelchair Mobility    Modified Rankin (Stroke Patients Only)       Balance Overall balance assessment: Needs assistance Sitting-balance support: Feet supported Sitting balance-Leahy Scale: Poor Sitting balance - Comments: preferred at least unilateral UE support     Standing balance-Leahy Scale: Poor                               Pertinent Vitals/Pain Pain Assessment: No/denies pain    Home Living Family/patient expects to be discharged to:: Private residence Living Arrangements: Spouse/significant other;Children Available Help at Discharge: Family(daughter available 24/7) Type of Home: House Home Access: Stairs to enter Entrance Stairs-Rails: Right Entrance  Stairs-Number of Steps: 4 Home Layout: One level Home Equipment: Wheelchair - Regulatory affairs officer - single point      Prior Function Level of Independence: Needs assistance   Gait / Transfers Assistance Needed: independent  ADL's / Homemaking  Assistance Needed: husband has been recently assisting with dressing/bathing homemaking duties        Hand Dominance   Dominant Hand: Right    Extremity/Trunk Assessment   Upper Extremity Assessment Upper Extremity Assessment: Generalized weakness    Lower Extremity Assessment Lower Extremity Assessment: Generalized weakness    Cervical / Trunk Assessment Cervical / Trunk Assessment: Normal  Communication   Communication: No difficulties  Cognition Arousal/Alertness: Lethargic Behavior During Therapy: WFL for tasks assessed/performed Overall Cognitive Status: Within Functional Limits for tasks assessed                                        General Comments      Exercises     Assessment/Plan    PT Assessment Patient needs continued PT services  PT Problem List Decreased strength;Decreased activity tolerance;Decreased balance;Cardiopulmonary status limiting activity;Decreased knowledge of precautions;Decreased mobility;Decreased safety awareness       PT Treatment Interventions DME instruction;Therapeutic exercise;Gait training;Balance training;Stair training;Functional mobility training;Therapeutic activities;Patient/family education;Neuromuscular re-education    PT Goals (Current goals can be found in the Care Plan section)  Acute Rehab PT Goals Patient Stated Goal: to get better PT Goal Formulation: With patient Time For Goal Achievement: 09/12/18 Potential to Achieve Goals: Fair    Frequency Min 2X/week   Barriers to discharge        Co-evaluation               AM-PAC PT "6 Clicks" Mobility  Outcome Measure Help needed turning from your back to your side while in a flat bed without using bedrails?: A Little Help needed moving from lying on your back to sitting on the side of a flat bed without using bedrails?: A Lot Help needed moving to and from a bed to a chair (including a wheelchair)?: A Lot Help needed standing up from a  chair using your arms (e.g., wheelchair or bedside chair)?: A Little Help needed to walk in hospital room?: A Little Help needed climbing 3-5 steps with a railing? : Total 6 Click Score: 14    End of Session Equipment Utilized During Treatment: Gait belt;Other (comment);Oxygen(2L) Activity Tolerance: Patient limited by fatigue;Patient limited by lethargy Patient left: in chair;with chair alarm set;with call bell/phone within reach Nurse Communication: Mobility status PT Visit Diagnosis: Other abnormalities of gait and mobility (R26.89);Unsteadiness on feet (R26.81);Difficulty in walking, not elsewhere classified (R26.2);Muscle weakness (generalized) (M62.81)    Time: 0347-4259 PT Time Calculation (min) (ACUTE ONLY): 18 min   Charges:   PT Evaluation $PT Eval Moderate Complexity: 1 Mod PT Treatments $Therapeutic Activity: 8-22 mins       Lieutenant Diego PT, DPT 1:28 PM,08/29/18 [phone]

## 2018-08-29 NOTE — Progress Notes (Signed)
[facility]  ROUNDING NOTE   Subjective:  Urine output was 1.2 L over the preceding 24 hours. Creatinine has come down to 3.79 however BUN still a bit higher at 68.    Objective:  Vital signs in last 24 hours:  Temp:  [98.4 F (36.9 C)-98.5 F (36.9 C)] 98.5 F (36.9 C) (08/11 0408) Pulse Rate:  [94-103] 103 (08/11 0408) Resp:  [20] 20 (08/11 0408) BP: (134-150)/(69-77) 150/69 (08/11 0408) SpO2:  [96 %-97 %] 96 % (08/11 0408) Weight:  [120 kg] 120 kg (08/11 1153)  Weight change:  Filed Weights   08/26/18 1226 08/26/18 2008 08/29/18 1153  Weight: 101.2 kg 117.1 kg 120 kg    Intake/Output: I/O last 3 completed shifts: In: 1569.6 [P.O.:600; I.V.:657.7; IV Piggyback:312] Out: 2150 [Urine:2150]   Intake/Output this shift:  Total I/O In: 240 [P.O.:240] Out: -   Physical Exam: General: No acute distress  Head: Normocephalic, atraumatic. Moist oral mucosal membranes  Eyes: Anicteric  Neck: Supple, trachea midline  Lungs:  Clear to auscultation, normal effort  Heart: S1S2 no rubs  Abdomen:  Soft, nontender, bowel sounds present  Extremities: Trace peripheral edema.  Neurologic: Awake, alert, following commands  Skin: No lesions       Basic Metabolic Panel: Recent Labs  Lab 08/26/18 1228 08/27/18 0420 08/28/18 0554 08/29/18 0608  NA 134* 135 133* 135  K 4.0 5.6* 4.3 4.5  CL 101 104 101 102  CO2 21* 21* 21* 21*  GLUCOSE 144* 108* 93 101*  BUN 30* 42* 58* 68*  CREATININE 2.13* 3.24* 4.10* 3.79*  CALCIUM 8.2* 8.1* 8.2* 8.9  MG  --   --   --  2.6*    Liver Function Tests: No results for input(s): AST, ALT, ALKPHOS, BILITOT, PROT, ALBUMIN in the last 168 hours. No results for input(s): LIPASE, AMYLASE in the last 168 hours. No results for input(s): AMMONIA in the last 168 hours.  CBC: Recent Labs  Lab 08/26/18 1228 08/27/18 0420 08/28/18 0554 08/29/18 0608  WBC 16.6* 16.8* 15.3* 15.3*  HGB 8.2* 6.9* 8.3* 8.7*  HCT 25.6* 22.4* 25.9*  27.4*  MCV 83.1 85.5 83.5 84.8  PLT 283 239 220 227    Cardiac Enzymes: No results for input(s): CKTOTAL, CKMB, CKMBINDEX, TROPONINI in the last 168 hours.  BNP: Invalid input(s): POCBNP  CBG: Recent Labs  Lab 08/28/18 1634  GLUCAP 98    Microbiology: Results for orders placed or performed during the hospital encounter of 08/26/18  Blood culture (routine x 2)     Status: None (Preliminary result)   Collection Time: 08/26/18  3:05 PM   Specimen: BLOOD  Result Value Ref Range Status   Specimen Description BLOOD RIGHT ANTECUBITAL  Final   Special Requests   Final    BOTTLES DRAWN AEROBIC AND ANAEROBIC Blood Culture adequate volume   Culture   Final    NO GROWTH 3 DAYS Performed at [facility], [address], [place], [place] [address]    Report Status PENDING  Incomplete  Blood culture (routine x 2)     Status: None (Preliminary result)   Collection Time: 08/26/18  3:05 PM   Specimen: BLOOD  Result Value Ref Range Status   Specimen Description BLOOD BLOOD LEFT HAND  Final   Special Requests   Final    BOTTLES DRAWN AEROBIC AND ANAEROBIC Blood Culture adequate volume   Culture   Final    NO GROWTH 3 DAYS Performed at [facility], [address], [place], [place] [address]    Report Status PENDING  Incomplete  Urine culture     Status: Abnormal   Collection Time: 08/26/18  4:32 PM   Specimen: Urine, Clean Catch  Result Value Ref Range Status   Specimen Description   Final    URINE, CLEAN CATCH Performed at [facility], [address], [place], [place] [address]    Special Requests   Final    Normal Performed at [facility], [place], [place] [address]    Culture >=100,000 COLONIES/mL ESCHERICHIA COLI (A)  Final   Report Status 08/29/2018 FINAL  Final   Organism ID, Bacteria ESCHERICHIA COLI (A)  Final      Susceptibility   Escherichia coli - MIC*    AMPICILLIN <=2 SENSITIVE Sensitive     CEFAZOLIN <=4  SENSITIVE Sensitive     CEFTRIAXONE <=1 SENSITIVE Sensitive     CIPROFLOXACIN >=4 RESISTANT Resistant     GENTAMICIN <=1 SENSITIVE Sensitive     IMIPENEM <=0.25 SENSITIVE Sensitive     NITROFURANTOIN <=16 SENSITIVE Sensitive     TRIMETH/SULFA >=320 RESISTANT Resistant     AMPICILLIN/SULBACTAM <=2 SENSITIVE Sensitive     PIP/TAZO <=4 SENSITIVE Sensitive     Extended ESBL NEGATIVE Sensitive     * >=100,000 COLONIES/mL ESCHERICHIA COLI  SARS Coronavirus 2 [facility] order, Performed in [facility] hospital lab) Nasopharyngeal Nasopharyngeal Swab     Status: None   Collection Time: 08/26/18  4:34 PM   Specimen: Nasopharyngeal Swab  Result Value Ref Range Status   SARS Coronavirus 2 NEGATIVE NEGATIVE Final    Comment: (NOTE) If result is NEGATIVE SARS-CoV-2 target nucleic acids are NOT DETECTED. The SARS-CoV-2 RNA is generally detectable in upper and lower  respiratory specimens during the acute phase of infection. The lowest  concentration of SARS-CoV-2 viral copies this assay can detect is 250  copies / mL. A negative result does not preclude SARS-CoV-2 infection  and should not be used as the sole basis for treatment or other  patient management decisions.  A negative result may occur with  improper specimen collection / handling, submission of specimen other  than nasopharyngeal swab, presence of viral mutation(s) within the  areas targeted by this assay, and inadequate number of viral copies  (<250 copies / mL). A negative result must be combined with clinical  observations, patient history, and epidemiological information. If result is POSITIVE SARS-CoV-2 target nucleic acids are DETECTED. The SARS-CoV-2 RNA is generally detectable in upper and lower  respiratory specimens dur ing the acute phase of infection.  Positive  results are indicative of active infection with SARS-CoV-2.  Clinical  correlation with patient history and other diagnostic information is  necessary to  determine patient infection status.  Positive results do  not rule out bacterial infection or co-infection with other viruses. If result is PRESUMPTIVE POSTIVE SARS-CoV-2 nucleic acids MAY BE PRESENT.   A presumptive positive result was obtained on the submitted specimen  and confirmed on repeat testing.  While 2019 novel coronavirus  (SARS-CoV-2) nucleic acids may be present in the submitted sample  additional confirmatory testing may be necessary for epidemiological  and / or clinical management purposes  to differentiate between  SARS-CoV-2 and other Sarbecovirus currently known to infect humans.  If clinically indicated additional testing with an alternate test  methodology [phone]) is advised. The SARS-CoV-2 RNA is generally  detectable in upper and lower respiratory sp ecimens during the acute  phase of infection. The expected result is Negative. Fact Sheet for Patients:  StrictlyIdeas.no Fact Sheet for Healthcare Providers: BankingDealers.co.za This test is not yet approved or cleared by the [place] FDA and has been authorized for detection and/or diagnosis of SARS-CoV-2 by FDA under an Emergency Use Authorization (EUA).  This EUA will remain in effect (meaning this test can be used) for the duration of the COVID-19 declaration under Section 564(b)(1) of the Act, 21 U.S.C. section 360bbb-3(b)(1), unless the authorization is terminated or revoked sooner. Performed at [facility], [address] [place], [place] [address]     Coagulation Studies: Recent Labs    08/26/18 1221 08/27/18 0420 08/28/18 0554 08/29/18 0608  LABPROT 27.2* 41.8* 47.0* 38.7*  INR 2.6* 4.5* 5.2* 4.0*    Urinalysis: Recent Labs    08/26/18 1632  COLORURINE AMBER*  LABSPEC 1.011  PHURINE 5.0  GLUCOSEU NEGATIVE  HGBUR MODERATE*  BILIRUBINUR NEGATIVE  KETONESUR NEGATIVE  PROTEINUR 100*  NITRITE NEGATIVE  LEUKOCYTESUR LARGE*       Imaging: US Renal  Result Date: 08/28/2018 CLINICAL DATA:  Acute renal failure. EXAM: RENAL / URINARY TRACT ULTRASOUND COMPLETE COMPARISON:  Abdominal CT 08/26/2018. FINDINGS: Right Kidney: Renal measurements: 12.2 x 6.2 x 6.5 cm = volume: 257 mL. There is increased cortical echogenicity. There is a cyst in the upper pole measuring 2.8 cm maximally. A shadowing calculus is also present in the upper pole. No hydronephrosis. Left Kidney: Renal measurements: 9.8 x 7.5 x 6.3 cm = volume: 241 mL. Increased cortical echogenicity with preserved cortical thickness. No hydronephrosis. Bladder: Appears normal for degree of bladder distention. IMPRESSION: 1. Both kidneys demonstrate increased cortical echogenicity as can be seen with medical renal disease. 2. No hydronephrosis.  Right renal cyst and calculus again noted. Electronically Signed   By: Richardean Sale M.D.   On: 08/28/2018 11:30     Medications:   .  ceFAZolin (ANCEF) IV 0.5 g (08/29/18 1008)   . atorvastatin  40 mg Oral q1800  . diltiazem  240 mg Oral Daily  . docusate sodium  100 mg Oral BID  . metoprolol succinate  50 mg Oral Daily  . montelukast  10 mg Oral QHS  . pantoprazole  40 mg Oral Daily  . patiromer  16.8 g Oral Daily  . polyethylene glycol  17 g Oral Daily  . tamsulosin  0.4 mg Oral QPM  . Warfarin - Pharmacist Dosing Inpatient   Does not apply q1800   acetaminophen **OR** acetaminophen, ALPRAZolam, bisacodyl, HYDROcodone-acetaminophen, ipratropium-albuterol, ondansetron **OR** ondansetron (ZOFRAN) IV, senna  Assessment/ Plan:  53 y.o. female  with with medical problems of CHF,Interstitial lung disease, HTN, A fib , who was admitted to [facility] on 08/26/2018 for evaluation of abdominal pain.  Diagnosed with sepsis due to acute pyelonephritis and ARF   1.  Acute kidney injury likely secondary to ongoing infection/pyelonephritis/CKD stage III. Baseline creatinine 1.25/GFR 57 from August 15, 2018 An 8 mm  nonobstructing  calculus is seen in the right kidney on the most recent CT -Overall renal function remains low as EGFR currently 15 however creatinine is down slightly to 3.79.  Continue to monitor renal parameters daily for now.  No indication for dialysis at the moment.  2.  Hyperkalemia.  Potassium remains within normal range at 4.5.  Maintain the patient on [place] 16.8 g daily and follow-up serum potassium daily for now.  3.  Hypertension.  Blood pressure currently 150/69.  Patient to be maintained on metoprolol as  well as diltiazem.   LOS: 3 Kirrah Mustin 8/11/202012:15 PM

## 2018-08-29 NOTE — Progress Notes (Signed)
*  PRELIMINARY RESULTS* Echocardiogram 2D Echocardiogram has been performed.  Leslie [name] 08/29/2018, 2:00 PM

## 2018-08-29 NOTE — Progress Notes (Signed)
[facility] NAME: Leslie [name]    MR#:  [id number]  DATE OF BIRTH:  1965-07-27  SUBJECTIVE:  CHIEF COMPLAINT:   Chief Complaint  Patient presents with  . Flank Pain   Patient has better right abdominal pain and flank pain.  She is on oxygen by nasal cannula 2 L due to hypoxia.  She had a bowel movement without melena or bloody stool. REVIEW OF SYSTEMS:  Review of Systems  Constitutional: Positive for malaise/fatigue. Negative for chills and fever.  HENT: Negative for sore throat.   Eyes: Negative for blurred vision and double vision.  Respiratory: Negative for cough, hemoptysis, shortness of breath, wheezing and stridor.   Cardiovascular: Negative for chest pain, palpitations, orthopnea and leg swelling.  Gastrointestinal: Positive for abdominal pain. Negative for blood in stool, constipation, diarrhea, melena, nausea and vomiting.  Genitourinary: Positive for flank pain. Negative for dysuria and hematuria.  Musculoskeletal: Negative for back pain and joint pain.  Skin: Negative for rash.  Neurological: Negative for dizziness, sensory change, focal weakness, seizures, loss of consciousness, weakness and headaches.  Endo/Heme/Allergies: Negative for polydipsia.  Psychiatric/Behavioral: Negative for depression. The patient is not nervous/anxious.     DRUG ALLERGIES:   Allergies  Allergen Reactions  . Morphine Hives and Rash  . Oxycodone Hcl Hives and Itching  . Dilaudid [Hydromorphone Hcl] Itching   VITALS:  Blood pressure (!) 144/75, pulse 96, temperature 98.5 F (36.9 C), temperature source Oral, resp. rate 20, height 5\' 4"  (1.626 m), weight 120 kg, SpO2 94 %. PHYSICAL EXAMINATION:  Physical Exam Constitutional:      General: She is not in acute distress. HENT:     Head: Normocephalic.     Mouth/Throat:     Mouth: Mucous membranes are moist.  Eyes:     General: No scleral icterus.    Conjunctiva/sclera:  Conjunctivae normal.     Pupils: Pupils are equal, round, and reactive to light.  Neck:     Musculoskeletal: Normal range of motion and neck supple.     Vascular: No JVD.     Trachea: No tracheal deviation.  Cardiovascular:     Rate and Rhythm: Normal rate and regular rhythm.     Heart sounds: Normal heart sounds. No murmur. No gallop.   Pulmonary:     Effort: Pulmonary effort is normal. No respiratory distress.     Breath sounds: Normal breath sounds. No wheezing or rales.  Abdominal:     General: Bowel sounds are normal. There is no distension.     Palpations: Abdomen is soft.     Tenderness: There is abdominal tenderness. There is right CVA tenderness. There is no rebound.  Musculoskeletal: Normal range of motion.        General: No tenderness.     Right lower leg: No edema.     Left lower leg: No edema.  Skin:    Findings: No erythema or rash.  Neurological:     Mental Status: She is alert and oriented to person, place, and time.     Cranial Nerves: No cranial nerve deficit.  Psychiatric:        Mood and Affect: Mood normal.    LABORATORY PANEL:  Female CBC Recent Labs  Lab 08/29/18 0608  WBC 15.3*  HGB 8.7*  HCT 27.4*  PLT 227   ------------------------------------------------------------------------------------------------------------------ Chemistries  Recent Labs  Lab 08/29/18 0608  NA 135  K 4.5  CL 102  CO2  21*  GLUCOSE 101*  BUN 68*  CREATININE 3.79*  CALCIUM 8.9  MG 2.6*   RADIOLOGY:  No results found. ASSESSMENT AND PLAN:   Hudsyn Barich  is a 53 y.o. female with a known history of persistent atrial fibrillation status post TEE cardioversion on Coumadin, hypertension, history of interstitial lung disease, diastolic heart failure, GERD, obesity and kidney stones presents to hospital secondary to worsening abdominal pain, nausea, shortness of breath and dysuria.  1.  Sepsis-likely secondary to acute pyelonephritis on the right side -Also had a  recent right ureteral stent 1 week ago which has been removed now. Follow-up blood cultures.  Lactic acid is within normal limits. She has been treated with meropenem-given complicated UTI and recent use of Bactrim as outpatient. Urine culture >=100,000 COLONIES/mL ESCHERICHIA COLI. Change to Rocephin IV.   Still leukocytosis.  Follow-up CBC.  2.  Acute renal failure on CKD stage III-Baseline creatinine seems to be around 1.2.  Worsening. -Also has been taking Toradol for her back pain as needed -Avoid nephrotoxins -No recent IV contrast -CT of the abdomen showing enlargement of right kidney with some perinephric edema, no obstructing calculus noted.  Repeat renal ultrasound report no hydronephrosis. Hold Lasix. The patient has been treated with gentle rehydration with fluids, follow BMP, Hold IV fluid due to hypoxia.  Hyponatremia.  Continue normal saline IV and follow-up BMP.  3.    Acute respiratory failure with hypoxia due to acute on chronic diastolic CHF.  -Chest x-ray showing bibasilar increased interstitial markings could be pulmonary edema versus atypical pneumonitis. -Chest x-ray from February also showing coarse interstitial markings.  Possible chronic interstitial lung disease.  Prior history of smoking -Check BNP.  1 dose of IV Lasix being given -Known history of diastolic heart failure with last echo showing normal ejection fraction.  Also complains of 1+ pedal edema in the last week. -Plain CT chest without contrast: Overall findings concerning for developing pulmonary edema. An atypical infectious process is not entirely excluded and should be correlated clinically. Hold Lasix due to worsening renal failure. Continue oxygen via nasal cannula.  IV as needed.  4.  Persistent atrial fibrillation-currently remains in normal sinus rhythm.  History of TEE cardioversion in the past. -Continue oral Cardizem and metoprolol per Dr. Saunders Revel. -On warfarin for anticoagulation.  Pharmacy  to adjust the dose Hold Coumadin due to possible GI bleeding.  Hyperkalemia.  Possible due to acute renal failure.  Started Veltassa for Dr. Candiss Norse.  Improved. Possible GI bleeding and bloody stool. Hold Coumadin.  Follow-up stool occult.  Supratherapeutic INR.  INR 4.0.  Hold Coumadin.  Anemia of chronic disease.  Hemoglobin decreased to 6.9.  S/P 1 unit PRBC transfusion.  Follow hemoglobin 8.7.  Generalized weakness.  PT evaluation. I discussed with Dr. Holley Raring and Dr. Saunders Revel. All the records are reviewed and case discussed with Care Management/Social Worker. Management plans discussed with the patient, her husband and they are in agreement.  CODE STATUS: Full Code  TOTAL TIME TAKING CARE OF THIS PATIENT: 36 minutes.   More than 50% of the time was spent in counseling/coordination of care: YES  POSSIBLE D/C IN 3 DAYS, DEPENDING ON CLINICAL CONDITION.   Demetrios Loll M.D on 08/29/2018 at 1:15 PM  Between 7am to 6pm - Pager - [phone]  After 6pm go to [email or web address] - [facility]

## 2018-08-30 DIAGNOSIS — N12 Tubulo-interstitial nephritis, not specified as acute or chronic: Secondary | ICD-10-CM

## 2018-08-30 DIAGNOSIS — J811 Chronic pulmonary edema: Secondary | ICD-10-CM

## 2018-08-30 DIAGNOSIS — D649 Anemia, unspecified: Secondary | ICD-10-CM

## 2018-08-30 LAB — BASIC METABOLIC PANEL
Anion gap: 11 (ref 5–15)
BUN: 65 mg/dL — ABNORMAL HIGH (ref 6–20)
CO2: 22 mmol/L (ref 22–32)
Calcium: 9 mg/dL (ref 8.9–10.3)
Chloride: 105 mmol/L (ref 98–111)
Creatinine, Ser: 2.86 mg/dL — ABNORMAL HIGH (ref 0.44–1.00)
GFR calc Af Amer: 21 mL/min — ABNORMAL LOW (ref >60)
GFR calc non Af Amer: 18 mL/min — ABNORMAL LOW (ref >60)
Glucose, Bld: 99 mg/dL (ref 70–99)
Potassium: 4.2 mmol/L (ref 3.5–5.1)
Sodium: 138 mmol/L (ref 135–145)

## 2018-08-30 LAB — CBC
HCT: 25.3 % — ABNORMAL LOW (ref 36.0–46.0)
Hemoglobin: 7.9 g/dL — ABNORMAL LOW (ref 12.0–15.0)
MCH: 26.9 pg (ref 26.0–34.0)
MCHC: 31.2 g/dL (ref 30.0–36.0)
MCV: 86.1 fL (ref 80.0–100.0)
Platelets: 227 10*3/uL (ref 150–400)
RBC: 2.94 MIL/uL — ABNORMAL LOW (ref 3.87–5.11)
RDW: 16.7 % — ABNORMAL HIGH (ref 11.5–15.5)
WBC: 12.4 10*3/uL — ABNORMAL HIGH (ref 4.0–10.5)
nRBC: 0.6 % — ABNORMAL HIGH (ref 0.0–0.2)

## 2018-08-30 LAB — PREPARE RBC (CROSSMATCH)

## 2018-08-30 LAB — PROTIME-INR
INR: 3.8 — ABNORMAL HIGH (ref 0.8–1.2)
Prothrombin Time: 36.7 seconds — ABNORMAL HIGH (ref 11.4–15.2)

## 2018-08-30 MED ORDER — FUROSEMIDE 10 MG/ML IJ SOLN
20.0000 mg | Freq: Once | INTRAMUSCULAR | Status: AC
  Administered 2018-08-30: 16:00:00 20 mg via INTRAVENOUS
  Filled 2018-08-30: qty 4

## 2018-08-30 MED ORDER — METOPROLOL SUCCINATE ER 100 MG PO TB24
100.0000 mg | ORAL_TABLET | Freq: Every day | ORAL | Status: DC
  Administered 2018-08-31 – 2018-09-01 (×2): 100 mg via ORAL
  Filled 2018-08-30 (×2): qty 1

## 2018-08-30 MED ORDER — SODIUM CHLORIDE 0.9% IV SOLUTION
Freq: Once | INTRAVENOUS | Status: AC
  Administered 2018-08-30: 19:00:00 via INTRAVENOUS

## 2018-08-30 MED ORDER — CEFAZOLIN SODIUM-DEXTROSE 1-4 GM/50ML-% IV SOLN
1.0000 g | Freq: Two times a day (BID) | INTRAVENOUS | Status: DC
  Administered 2018-08-30 (×2): 1 g via INTRAVENOUS
  Filled 2018-08-30 (×4): qty 50

## 2018-08-30 NOTE — Progress Notes (Signed)
[facility] for Warfarin  Indication: atrial fibrillation  Patient Measurements: Height: _0  (162.6 cm) Weight: 269 lb 6.4 oz (122.2 kg) IBW/kg (Calculated) : 54.7  Vital Signs: Temp: 98.2 F (36.8 C) (08/12 0405) Temp Source: Oral (08/12 0405) BP: 143/72 (08/12 0405) Pulse Rate: 88 (08/12 0405)  Labs: Recent Labs    08/28/18 0554 08/29/18 0608 08/30/18 0615  HGB 8.3* 8.7* 7.9*  HCT 25.9* 27.4* 25.3*  PLT 220 227 227  LABPROT 47.0* 38.7* 36.7*  INR 5.2* 4.0* 3.8*  CREATININE 4.10* 3.79* 2.86*    Estimated Creatinine Clearance: 29.7 mL/min (A) (by C-G formula based on SCr of 2.86 mg/dL (H)).   Medical History: Past Medical History:  Diagnosis Date  . (HFpEF) heart failure with preserved ejection fraction ([place])    a. 08/2017 Echo: EF 55-60%.  Grade 2 diastolic dysfunction.  Moderate mitral stenosis.  . Allergy   . Anemia   . Anxiety   . BRCA negative 03/22/2013  . Bronchitis 02/19/2016   ON LEVAQUIN PO  . Chest tightness   . CHF (congestive heart failure) ([place])   . Cigarette smoker 09/11/2017   8-10 day  . GERD (gastroesophageal reflux disease)   . History of kidney stones   . Hyperlipidemia   . Hypertension   . Interstitial lung disease ([place])    a. CT 2013 b. 02/2018 CXR noted recurrent intersistial changes ILD vs chronic bronchitis  . Moderate mitral stenosis    a.  08/2017 TEE: EF 60 to 65%.  Moderate mitral stenosis.  Mean gradient 14 mmHg.  Valve area 2.59 cm by planimetry, 2.72 cm by pressure half-time.  . Palpitations   . Persistent atrial fibrillation    a. 08/2017 s/p TEE/DCCV; b. CHA2DS2VASc = 2-->warfarin.    Medications:  Medications Prior to Admission  Medication Sig Dispense Refill Last Dose  . albuterol (VENTOLIN HFA) 108 (90 Base) MCG/ACT inhaler Inhale 2 puffs into the lungs every 4 (four) hours as needed for wheezing or shortness of breath. 3 Inhaler 0 Unknown at PRN  . ALPRAZolam (XANAX) 0.5 MG  tablet Take 1 tablet (0.5 mg total) by mouth at bedtime as needed for anxiety. 30 tablet 2 Unknown at PRN  . atorvastatin (LIPITOR) 40 MG tablet Take 1 tablet (40 mg total) by mouth daily at 6 PM. 90 tablet 3 08/25/2018 at 1800  . celecoxib (CELEBREX) 200 MG capsule Take 200 mg by mouth at bedtime as needed for mild pain.    Unknown at PRN  . estradiol (ESTRACE) 0.5 MG tablet Take 1 tablet (0.5 mg total) by mouth daily. 90 tablet 1 08/26/2018 at 0730  . metoprolol succinate (TOPROL-XL) 25 MG 24 hr tablet Take 1 tablet (25 mg total) by mouth daily. Take with or immediately following a meal. 90 tablet 0 08/26/2018 at 0730  . montelukast (SINGULAIR) 10 MG tablet Take 1 tablet (10 mg total) by mouth at bedtime. 90 tablet 2 08/25/2018 at 2100  . naproxen sodium (ALEVE) 220 MG tablet Take 440 mg by mouth daily as needed (pain).   Unknown at PRN  . omeprazole (PRILOSEC) 40 MG capsule Take 1 capsule (40 mg total) by mouth daily. 90 capsule 1 08/26/2018 at 0730  . potassium chloride (K-DUR) 10 MEQ tablet Take 2 tablets (20 meq) by mouth once daily (Patient taking differently: Take 20 mEq by mouth daily. ) 180 tablet 2 08/25/2018 at 0700  . sulfamethoxazole-trimethoprim (BACTRIM DS) 800-160 MG tablet Take 1 tablet by mouth  daily. 4 tablet 0 08/25/2018 at 1800  . tamsulosin (FLOMAX) 0.4 MG CAPS capsule Take 0.4 mg by mouth every evening.    08/25/2018 at 1800  . warfarin (COUMADIN) 2.5 MG tablet TAKE 1/2 TO 1 TABLET DAILY AS DIRECTED BY COUMADIN CLINIC. (Patient taking differently: Take 1.25-2.5 mg by mouth See admin instructions. Take 2.5 mg at night on Mon, Wed, and Fri.  Take 1.25 mg at night on Sun, Tue, Thur, and Sat) 90 tablet 1 08/25/2018 at 2100  . chlorpheniramine-HYDROcodone (TUSSIONEX PENNKINETIC ER) 10-8 MG/5ML SUER Take 5 mLs by mouth every 12 (twelve) hours as needed for cough. (Patient not taking: Reported on 08/26/2018) 115 mL 0 Not Taking at Unknown time    Assessment: Pharmacy consulted to dose warfarin in this  53 year old female with Afib.  Pt was on Warfarin 2.5 mg PO MWF and warfarin 1.25 mg PO T-Th-Sat-Sun.   DDIs: cefazolin and Veltassa   It appears she was on Bactrim PTA s/p R ureteral stent ~1 week ago.    H&H down slightly, PLT wnl Patient is s/p 1 unit PRBC on 8/9.   Date  INR  Dose 8/8   2.6    1.25 mg 8/9  4.5    HOLD  8/10  5.2  HOLD 8/11  4.0  HOLD 8/12  3.8  HOLD  Goal of Therapy:  INR 2-3   Plan:   Hold Warfarin dose today   recheck INR with AM labs.   Dallie Piles, PharmD  Clinical Pharmacist 08/30/2018,7:21 AM

## 2018-08-30 NOTE — Evaluation (Signed)
Occupational Therapy Evaluation Patient Details Name: Leslie [name] MRN: [id number] DOB: 1965-02-17 Today's Date: 08/30/2018    History of Present Illness 53 y.o. female with a known history of persistent atrial fibrillation status post TEE cardioversion on Coumadin, hypertension, history of interstitial lung disease, diastolic heart failure, GERD, obesity and kidney stones presented to hospital secondary to worsening abdominal pain, nausea, shortness of breath and dysuria. Work up for sepsis likely due to acute pyelonephritis; stent removal 1 week ago and acute renal failure. Pt  s/p transfusion during hospital stay as well. INR 4.   Clinical Impression   [name] was seen for OT evaluation this date. Prior to admission, pt reports needing minimal assistance from her husband for bathing and dressing tasks. She states she does not use an AD for household mobility. She lives in a 1 story home with 4 steps to enter with her husband and adult daughter. Pt does not use O2 in the home. She reports becoming easily fatigued or out of breath with minimal exertion, and has recently begun to limit her community engagement due to increased fatigue and decreased activity tolerance. Pt currently requires moderate to max assist for lower body bathing and dressing tasks due to poor activity tolerance and decreased endurance. She is currently on 2.5-3L O2 and continues to experience increased SOB and fatigue with bed mobility or activity. Pt educated in energy conservation conservation strategies including pursed lip breathing, activity pacing, home/routines modifications, work simplification, AE/DME, prioritizing of meaningful occupations, and falls prevention. Handout provided. Pt verbalized understanding but would benefit from additional skilled OT services to maximize recall and carryover of learned techniques and facilitate implementation of learned techniques into daily routines. Upon discharge, recommend STR to  maximize pt safety and return to PLOF.     Follow Up Recommendations  SNF    Equipment Recommendations  3 in 1 bedside commode    Recommendations for Other Services       Precautions / Restrictions Precautions Precautions: Fall Restrictions Weight Bearing Restrictions: No      Mobility Bed Mobility Overal bed mobility: Needs Assistance Bed Mobility: Supine to Sit     Supine to sit: Min assist        Transfers                      Balance Overall balance assessment: Needs assistance Sitting-balance support: Feet supported Sitting balance-Leahy Scale: Fair                                     ADL either performed or assessed with clinical judgement   ADL Overall ADL's : Needs assistance/impaired Eating/Feeding: Set up;Sitting;Bed level   Grooming: Set up;Minimal assistance;Sitting;Bed level   Upper Body Bathing: Sitting;Minimal assistance;Moderate assistance   Lower Body Bathing: Sitting/lateral leans;Moderate assistance;Maximal assistance   Upper Body Dressing : Set up;Min guard;Sitting   Lower Body Dressing: Set up;Sitting/lateral leans;Moderate assistance;Maximal assistance     Toilet Transfer Details (indicate cue type and reason): Pt has not yet been able to transfer to toilet on this date. Currently bed level for toileting. Toileting- Clothing Manipulation and Hygiene: Set up;Moderate assistance               Vision Baseline Vision/History: Wears glasses Wears Glasses: At all times Patient Visual Report: Blurring of vision Vision Assessment?: Vision impaired- to be further tested in functional context     Perception  Praxis      Pertinent Vitals/Pain Pain Assessment: No/denies pain     Hand Dominance Right   Extremity/Trunk Assessment Upper Extremity Assessment Upper Extremity Assessment: Generalized weakness(Grossly 3/5 t/o BUE. Grip WFL.)   Lower Extremity Assessment Lower Extremity Assessment:  Generalized weakness;Defer to PT evaluation   Cervical / Trunk Assessment Cervical / Trunk Assessment: Normal   Communication Communication Communication: No difficulties   Cognition Arousal/Alertness: Awake/alert Behavior During Therapy: WFL for tasks assessed/performed Overall Cognitive Status: Within Functional Limits for tasks assessed                                     General Comments  Pt on 2.5 L [place] t/o session O2 sats stable at 99 with limited physical activity during session.    Exercises Other Exercises Other Exercises: Pt and husband educated in energy conservation strategies including activity pacing, routines modifications, prioritizing meaningful occupations and PLB to support activity tolerance and functional independence upon hospital DC. Handout provided.   Shoulder Instructions      Home Living Family/patient expects to be discharged to:: Private residence Living Arrangements: Spouse/significant other;Children Available Help at Discharge: Family;Available PRN/intermittently(Dtr) Type of Home: House Home Access: Stairs to enter [employer] of Steps: 4 Entrance Stairs-Rails: Right Home Layout: One level     Bathroom Shower/Tub: Occupational psychologist: Standard     Home Equipment: Wheelchair - Regulatory affairs officer - single point          Prior Functioning/Environment Level of Independence: Needs assistance  Gait / Transfers Assistance Needed: independent ADL's / Homemaking Assistance Needed: husband has been recently assisting with dressing/bathing homemaking duties   Comments: Amb HH distances w/o AE. Denies falls.        OT Problem List: Decreased strength;Decreased coordination;Cardiopulmonary status limiting activity;Decreased activity tolerance;Decreased safety awareness;Decreased knowledge of use of DME or AE;Decreased knowledge of precautions;Impaired vision/perception;Impaired balance (sitting and/or  standing)      OT Treatment/Interventions: Self-care/ADL training;Therapeutic exercise;Balance training;Therapeutic activities;Energy conservation;DME and/or AE instruction;Patient/family education    OT Goals(Current goals can be found in the care plan section) Acute Rehab OT Goals Patient Stated Goal: to get better OT Goal Formulation: With patient Time For Goal Achievement: 09/13/18 Potential to Achieve Goals: Good ADL Goals Pt Will Perform Grooming: sitting;with set-up;with adaptive equipment(LRAD PRN for improved safety and functional independence.) Pt Will Perform Upper Body Bathing: with min assist;sitting;with adaptive equipment(LRAD PRN for improved safety and functional independence.) Pt Will Perform Lower Body Bathing: with min assist;with adaptive equipment;sitting/lateral leans(LRAD PRN for improved safety and functional independence.) Additional ADL Goal #1: Pt will independently verbalize a plan to implement at least 3 learned ECS for improved safety and functional independence during meaningful daily occupations upon hospital DC.  OT Frequency: Min 1X/week   Barriers to D/C: Inaccessible home environment          Co-evaluation              AM-PAC OT "6 Clicks" Daily Activity     Outcome Measure Help from another person eating meals?: A Little Help from another person taking care of personal grooming?: A Little Help from another person toileting, which includes using toliet, bedpan, or urinal?: A Lot Help from another person bathing (including washing, rinsing, drying)?: A Lot Help from another person to put on and taking off regular upper body clothing?: A Little Help from another person to put on and taking  off regular lower body clothing?: A Lot 6 Click Score: 15   End of Session    Activity Tolerance: Patient tolerated treatment well Patient left: in bed;with call bell/phone within reach;with bed alarm set;with family/visitor present  OT Visit Diagnosis:  Muscle weakness (generalized) (M62.81);Other abnormalities of gait and mobility (R26.89)                Time: 7867-5449 OT Time Calculation (min): 15 min Charges:  OT General Charges $OT Visit: 1 Visit OT Evaluation $OT Eval Low Complexity: 1 Low OT Treatments $Self Care/Home Management : 8-22 mins  Shara Blazing, M.S., OTR/L Ascom: [phone] 08/30/18, 3:02 PM

## 2018-08-30 NOTE — Progress Notes (Signed)
PT Cancellation Note  Patient Details Name: Leslie [name] MRN: [id number] DOB: 1965-07-11   Cancelled Treatment:    Reason Eval/Treat Not Completed: Other (comment); Upon entering room pt reported she is too fatigued to participate with PT this PM then noted pt receiving transfusion.  Will hold PT services this date and attempt to see pt at a future date/time as medically appropriate.     Linus Salmons PT, DPT 08/30/18, 4:44 PM

## 2018-08-30 NOTE — Plan of Care (Signed)
Problem: Education: Goal: Knowledge of General Education information will improve Description: Including pain rating scale, medication(s)/side effects and non-pharmacologic comfort measures Outcome: Progressing Dr. Bridgett Larsson rounded on pt this AM and dicussed POC.  Pt understands POC and had no further questions.  Will continue to monitor and assess.   Problem: Health Behavior/Discharge Planning: Goal: Ability to manage health-related needs will improve Outcome: Progressing Pt is able to verbalized POC and had no further questions concerning her hospitalization.   Problem: Clinical Measurements: Goal: Ability to maintain clinical measurements within normal limits will improve Outcome: Progressing Pt was slightly hypertensive this shift.  She is on 3.5L of O2 via nasal cannula. With O2 saturations at 97% and respirations of 16 breaths per minute.  She continue to endorsed SOB on exertions.  Will continue to monitor and assess.   Problem: Clinical Measurements: Goal: Diagnostic test results will improve Outcome: Progressing Pt's H/H dropped slightly this AM.  It is currently 7.9/25.3 as of 0615 on 08/30/18.  Bridgett Larsson, MD made aware.  Will continue to monitor and assess.   Problem: Clinical Measurements: Goal: Will remain free from infection Outcome: Progressing S/Sx of infection monitor and assessed q4 hours/ PRN, along with VS.  Pt has remained afebrile thus far.  She is on IV abx per MD's orders.  Will continue to monitor and assess.   Problem: Clinical Measurements: Goal: Respiratory complications will improve Outcome: Progressing Respiratory status monitored and assessed q4 hours/ PRN, along with VS.  Pt is on 3.5L of O2 via nasal cannula. With O2 saturations at 97% and respirations of 16 breaths per minute.  She continue to endorsed SOB on exertions.  Will continue to monitor and assess.    Problem: Clinical Measurements: Goal: Cardiovascular complication will be avoided Outcome:  Progressing Pt is on continuous telemetry monitoring per MD's orders.  Will continue to monitor and assess.   Problem: Activity: Goal: Risk for activity intolerance will decrease Outcome: Progressing Pt is able to get OOB to the chair utilizing a walker with 1x assist from RN staff.   Problem: Nutrition: Goal: Adequate nutrition will be maintained Outcome: Progressing Pt is on a carb modified/ heart healthy diet.  She was able to eat 75% of her meals this shift.  Will continue to monitor and assess.   Problem: Elimination: Goal: Will not experience complications related to bowel motility Outcome: Progressing Pt's LMB was 08/11/202.  She has stool softeners ordered per MD.  She has refused her stool softeners this shift.    Problem: Pain Managment: Goal: General experience of comfort will improve Outcome: Progressing Pt has c/o 3-6/10 right back pain describing it as a sharp ache.  Reiterated pain scale so she could adequately rate her pain.  Pt stated her pain goal this admission would be 0/10.  Discussed nonpharmacological methods to help reduce s/sx of pain.  Interventions given per pt's request and MD's orders.  Will continue to monitor and assess.   Problem: Safety: Goal: Ability to remain free from injury will improve Outcome: Progressing Instructed pt to utilize RN call light for assistance.  Hourly rounds performed.  Bed in lowest position, locked with two upper side rails engaged.  Belongings and call light within reach.    Problem: Skin Integrity: Goal: Risk for impaired skin integrity will decrease Outcome: Progressing Skin integrity monitored and assessed q-shift/ PRN.  Instructed pt to turn q2 hours to prevent further skin impairment.  Tubes and drains assessed for device related pressure sores.  Will continue to monitor  and assess.

## 2018-08-30 NOTE — Progress Notes (Signed)
[facility]  ROUNDING NOTE   Subjective:  Renal function appears to be improving. Creatinine now down to 2.86. Patient reports that she is making urine.    Objective:  Vital signs in last 24 hours:  Temp:  [97.4 F (36.3 C)-98.9 F (37.2 C)] 97.4 F (36.3 C) (08/12 1428) Pulse Rate:  [88-110] 88 (08/12 1428) Resp:  [16-20] 20 (08/12 1428) BP: (107-155)/(62-77) 107/62 (08/12 1428) SpO2:  [84 %-100 %] 100 % (08/12 1428) Weight:  [122.2 kg] 122.2 kg (08/12 0405)  Weight change:  Filed Weights   08/26/18 2008 08/29/18 1153 08/30/18 0405  Weight: 117.1 kg 120 kg 122.2 kg    Intake/Output: I/O last 3 completed shifts: In: 1511.6 [P.O.:1198; I.V.:96.7; IV Piggyback:216.9] Out: 850 [Urine:850]   Intake/Output this shift:  No intake/output data recorded.  Physical Exam: General: No acute distress  Head: Normocephalic, atraumatic. Moist oral mucosal membranes  Eyes: Anicteric  Neck: Supple, trachea midline  Lungs:  Clear to auscultation, normal effort  Heart: S1S2 no rubs  Abdomen:  Soft, nontender, bowel sounds present  Extremities: Trace peripheral edema.  Neurologic: Awake, alert, following commands  Skin: No lesions       Basic Metabolic Panel: Recent Labs  Lab 08/26/18 1228 08/27/18 0420 08/28/18 0554 08/29/18 0608 08/30/18 0615  NA 134* 135 133* 135 138  K 4.0 5.6* 4.3 4.5 4.2  CL 101 104 101 102 105  CO2 21* 21* 21* 21* 22  GLUCOSE 144* 108* 93 101* 99  BUN 30* 42* 58* 68* 65*  CREATININE 2.13* 3.24* 4.10* 3.79* 2.86*  CALCIUM 8.2* 8.1* 8.2* 8.9 9.0  MG  --   --   --  2.6*  --     Liver Function Tests: No results for input(s): AST, ALT, ALKPHOS, BILITOT, PROT, ALBUMIN in the last 168 hours. No results for input(s): LIPASE, AMYLASE in the last 168 hours. No results for input(s): AMMONIA in the last 168 hours.  CBC: Recent Labs  Lab 08/26/18 1228 08/27/18 0420 08/28/18 0554 08/29/18 0608 08/30/18 0615  WBC 16.6* 16.8* 15.3*  15.3* 12.4*  HGB 8.2* 6.9* 8.3* 8.7* 7.9*  HCT 25.6* 22.4* 25.9* 27.4* 25.3*  MCV 83.1 85.5 83.5 84.8 86.1  PLT 283 239 220 227 227    Cardiac Enzymes: No results for input(s): CKTOTAL, CKMB, CKMBINDEX, TROPONINI in the last 168 hours.  BNP: Invalid input(s): POCBNP  CBG: Recent Labs  Lab 08/28/18 1634  GLUCAP 98    Microbiology: Results for orders placed or performed during the hospital encounter of 08/26/18  Blood culture (routine x 2)     Status: None (Preliminary result)   Collection Time: 08/26/18  3:05 PM   Specimen: BLOOD  Result Value Ref Range Status   Specimen Description BLOOD RIGHT ANTECUBITAL  Final   Special Requests   Final    BOTTLES DRAWN AEROBIC AND ANAEROBIC Blood Culture adequate volume   Culture   Final    NO GROWTH 4 DAYS Performed at [facility], [address] [place], [place] [address]    Report Status PENDING  Incomplete  Blood culture (routine x 2)     Status: None (Preliminary result)   Collection Time: 08/26/18  3:05 PM   Specimen: BLOOD  Result Value Ref Range Status   Specimen Description BLOOD BLOOD LEFT HAND  Final   Special Requests   Final    BOTTLES DRAWN AEROBIC AND ANAEROBIC Blood Culture adequate volume   Culture   Final  NO GROWTH 4 DAYS Performed at [facility], [address] [place], [place] [address]    Report Status PENDING  Incomplete  Urine culture     Status: Abnormal   Collection Time: 08/26/18  4:32 PM   Specimen: Urine, Clean Catch  Result Value Ref Range Status   Specimen Description   Final    URINE, CLEAN CATCH Performed at [facility], [address], [place], [place] [address]    Special Requests   Final    Normal Performed at [facility], [place],  [address]    Culture >=100,000 COLONIES/mL ESCHERICHIA COLI (A)  Final   Report Status 08/29/2018 FINAL  Final   Organism ID, Bacteria ESCHERICHIA COLI (A)  Final      Susceptibility    Escherichia coli - MIC*    AMPICILLIN <=2 SENSITIVE Sensitive     CEFAZOLIN <=4 SENSITIVE Sensitive     CEFTRIAXONE <=1 SENSITIVE Sensitive     CIPROFLOXACIN >=4 RESISTANT Resistant     GENTAMICIN <=1 SENSITIVE Sensitive     IMIPENEM <=0.25 SENSITIVE Sensitive     NITROFURANTOIN <=16 SENSITIVE Sensitive     TRIMETH/SULFA >=320 RESISTANT Resistant     AMPICILLIN/SULBACTAM <=2 SENSITIVE Sensitive     PIP/TAZO <=4 SENSITIVE Sensitive     Extended ESBL NEGATIVE Sensitive     * >=100,000 COLONIES/mL ESCHERICHIA COLI  SARS Coronavirus 2 [facility] order, Performed in [place] hospital lab) Nasopharyngeal Nasopharyngeal Swab     Status: None   Collection Time: 08/26/18  4:34 PM   Specimen: Nasopharyngeal Swab  Result Value Ref Range Status   SARS Coronavirus 2 NEGATIVE NEGATIVE Final    Comment: (NOTE) If result is NEGATIVE SARS-CoV-2 target nucleic acids are NOT DETECTED. The SARS-CoV-2 RNA is generally detectable in upper and lower  respiratory specimens during the acute phase of infection. The lowest  concentration of SARS-CoV-2 viral copies this assay can detect is 250  copies / mL. A negative result does not preclude SARS-CoV-2 infection  and should not be used as the sole basis for treatment or other  patient management decisions.  A negative result may occur with  improper specimen collection / handling, submission of specimen other  than nasopharyngeal swab, presence of viral mutation(s) within the  areas targeted by this assay, and inadequate number of viral copies  (<250 copies / mL). A negative result must be combined with clinical  observations, patient history, and epidemiological information. If result is POSITIVE SARS-CoV-2 target nucleic acids are DETECTED. The SARS-CoV-2 RNA is generally detectable in upper and lower  respiratory specimens dur ing the acute phase of infection.  Positive  results are indicative of active infection with SARS-CoV-2.  Clinical   correlation with patient history and other diagnostic information is  necessary to determine patient infection status.  Positive results do  not rule out bacterial infection or co-infection with other viruses. If result is PRESUMPTIVE POSTIVE SARS-CoV-2 nucleic acids MAY BE PRESENT.   A presumptive positive result was obtained on the submitted specimen  and confirmed on repeat testing.  While 2019 novel coronavirus  (SARS-CoV-2) nucleic acids may be present in the submitted sample  additional confirmatory testing may be necessary for epidemiological  and / or clinical management purposes  to differentiate between  SARS-CoV-2 and other Sarbecovirus currently known to infect humans.  If clinically indicated additional testing with an alternate test  methodology [phone]) is advised. The SARS-CoV-2 RNA is generally  detectable in  upper and lower respiratory sp ecimens during the acute  phase of infection. The expected result is Negative. Fact Sheet for Patients:  StrictlyIdeas.no Fact Sheet for Healthcare Providers: BankingDealers.co.za This test is not yet approved or cleared by the [place] FDA and has been authorized for detection and/or diagnosis of SARS-CoV-2 by FDA under an Emergency Use Authorization (EUA).  This EUA will remain in effect (meaning this test can be used) for the duration of the COVID-19 declaration under Section 564(b)(1) of the Act, 21 U.S.C. section 360bbb-3(b)(1), unless the authorization is terminated or revoked sooner. Performed at [facility], [address] [place], [place] [address]     Coagulation Studies: Recent Labs    08/28/18 0554 08/29/18 0608 08/30/18 0615  LABPROT 47.0* 38.7* 36.7*  INR 5.2* 4.0* 3.8*    Urinalysis: No results for input(s): COLORURINE, LABSPEC, PHURINE, GLUCOSEU, HGBUR, BILIRUBINUR, KETONESUR, PROTEINUR, UROBILINOGEN, NITRITE, LEUKOCYTESUR in the last 72  hours.  Invalid input(s): APPERANCEUR    Imaging: No results found.   Medications:   .  ceFAZolin (ANCEF) IV 1 g (08/30/18 1128)   . sodium chloride   Intravenous Once  . atorvastatin  40 mg Oral q1800  . diltiazem  240 mg Oral Daily  . docusate sodium  100 mg Oral BID  . furosemide  20 mg Intravenous Once  . [START ON 08/31/2018] metoprolol succinate  100 mg Oral Daily  . montelukast  10 mg Oral QHS  . pantoprazole  40 mg Oral Daily  . patiromer  16.8 g Oral Daily  . tamsulosin  0.4 mg Oral QPM  . Warfarin - Pharmacist Dosing Inpatient   Does not apply q1800   acetaminophen **OR** acetaminophen, ALPRAZolam, bisacodyl, HYDROcodone-acetaminophen, ipratropium-albuterol, ondansetron **OR** ondansetron (ZOFRAN) IV, senna  Assessment/ Plan:  53 y.o. female  with with medical problems of CHF,Interstitial lung disease, HTN, A fib , who was admitted to [facility] on 08/26/2018 for evaluation of abdominal pain.  Diagnosed with sepsis due to acute pyelonephritis and ARF   1.  Acute kidney injury likely secondary to ongoing infection/pyelonephritis/CKD stage III. Baseline creatinine 1.25/GFR 57 from August 15, 2018 An 8 mm  nonobstructing calculus is seen in the right kidney on the most recent CT -Kidney function slowly improving.  Creatinine down to 2.86.  Avoid nephrotoxins as possible.  Continue to monitor renal parameters daily for now.  2.  Hyperkalemia.  Potassium remains normal at 4.2.  Maintain the patient on Veltassa 16.8 g p.o. daily for now.  Consider weaning this down tomorrow.  3.  Hypertension.  Blood pressure currently 107/62.  Maintain the patient on diltiazem and metoprolol.   LOS: 4 Camelle Henkels 8/12/20203:21 PM

## 2018-08-30 NOTE — Progress Notes (Signed)
[place] NAME: Leslie [name]    MR#:  [id number]  DATE OF BIRTH:  01-29-1965  SUBJECTIVE:  CHIEF COMPLAINT:   Chief Complaint  Patient presents with   Flank Pain   Patient has better right abdominal pain and flank pain.  She has shortness of breath on exertion.  She is on oxygen by nasal cannula 2 L due to hypoxia.  She had bowel movement without melena or bloody stool. REVIEW OF SYSTEMS:  Review of Systems  Constitutional: Positive for malaise/fatigue. Negative for chills and fever.  HENT: Negative for sore throat.   Eyes: Negative for blurred vision and double vision.  Respiratory: Positive for shortness of breath. Negative for cough, hemoptysis, wheezing and stridor.   Cardiovascular: Negative for chest pain, palpitations, orthopnea and leg swelling.  Gastrointestinal: Positive for abdominal pain. Negative for blood in stool, constipation, diarrhea, melena, nausea and vomiting.  Genitourinary: Positive for flank pain. Negative for dysuria and hematuria.  Musculoskeletal: Negative for back pain and joint pain.  Skin: Negative for rash.  Neurological: Negative for dizziness, sensory change, focal weakness, seizures, loss of consciousness, weakness and headaches.  Endo/Heme/Allergies: Negative for polydipsia.  Psychiatric/Behavioral: Negative for depression. The patient is not nervous/anxious.     DRUG ALLERGIES:   Allergies  Allergen Reactions   Morphine Hives and Rash   Oxycodone Hcl Hives and Itching   Dilaudid [Hydromorphone Hcl] Itching   VITALS:  Blood pressure (!) 143/77, pulse 88, temperature (!) 97.5 F (36.4 C), temperature source Oral, resp. rate 16, height 5\' 4"  (1.626 m), weight 122.2 kg, SpO2 97 %. PHYSICAL EXAMINATION:  Physical Exam Constitutional:      General: She is not in acute distress. HENT:     Head: Normocephalic.     Mouth/Throat:     Mouth: Mucous membranes are moist.  Eyes:   General: No scleral icterus.    Conjunctiva/sclera: Conjunctivae normal.     Pupils: Pupils are equal, round, and reactive to light.  Neck:     Musculoskeletal: Normal range of motion and neck supple.     Vascular: No JVD.     Trachea: No tracheal deviation.  Cardiovascular:     Rate and Rhythm: Normal rate and regular rhythm.     Heart sounds: Murmur present. No gallop.   Pulmonary:     Effort: Pulmonary effort is normal. No respiratory distress.     Breath sounds: Normal breath sounds. No wheezing or rales.  Abdominal:     General: Bowel sounds are normal. There is no distension.     Palpations: Abdomen is soft.     Tenderness: There is abdominal tenderness. There is right CVA tenderness. There is no rebound.  Musculoskeletal: Normal range of motion.        General: No tenderness.     Right lower leg: No edema.     Left lower leg: No edema.  Skin:    Findings: No erythema or rash.  Neurological:     Mental Status: She is alert and oriented to person, place, and time.     Cranial Nerves: No cranial nerve deficit.  Psychiatric:        Mood and Affect: Mood normal.    LABORATORY PANEL:  Female CBC Recent Labs  Lab 08/30/18 0615  WBC 12.4*  HGB 7.9*  HCT 25.3*  PLT 227   ------------------------------------------------------------------------------------------------------------------ Chemistries  Recent Labs  Lab 08/29/18 0608 08/30/18 0615  NA 135 138  K 4.5 4.2  CL 102 105  CO2 21* 22  GLUCOSE 101* 99  BUN 68* 65*  CREATININE 3.79* 2.86*  CALCIUM 8.9 9.0  MG 2.6*  --    RADIOLOGY:  No results found. ASSESSMENT AND PLAN:   Leslie [name]  is a 53 y.o. female with a known history of persistent atrial fibrillation status post TEE cardioversion on Coumadin, hypertension, history of interstitial lung disease, diastolic heart failure, GERD, obesity and kidney stones presents to hospital secondary to worsening abdominal pain, nausea, shortness of breath and  dysuria.  1.  Sepsis-likely secondary to acute pyelonephritis on the right side -Also had a recent right ureteral stent 1 week ago which has been removed now. Follow-up blood cultures.  Lactic acid is within normal limits. She has been treated with meropenem-given complicated UTI and recent use of Bactrim as outpatient. Urine culture >=100,000 COLONIES/mL ESCHERICHIA COLI. Changed to Ancef IV.  Leukocytosis is improving.   2.  Acute renal failure on CKD stage III-Baseline creatinine seems to be around 1.2.  Worsening. -Also has been taking Toradol for her back pain as needed -Avoid nephrotoxins -No recent IV contrast -CT of the abdomen showing enlargement of right kidney with some perinephric edema, no obstructing calculus noted.  Repeat renal ultrasound report no hydronephrosis. Hold Lasix. The patient has been treated with gentle rehydration with fluids. Hold IV fluid due to hypoxia. Renal function is improving.  Hyponatremia.  Improved.  3.    Acute respiratory failure with hypoxia due to acute on chronic diastolic CHF.  -Chest x-ray showing bibasilar increased interstitial markings could be pulmonary edema versus atypical pneumonitis. -Chest x-ray from February also showing coarse interstitial markings.  Possible chronic interstitial lung disease.  Prior history of smoking -Check BNP.  1 dose of IV Lasix being given -Known history of diastolic heart failure with last echo showing normal ejection fraction.  Also complains of 1+ pedal edema in the last week. -Plain CT chest without contrast: Overall findings concerning for developing pulmonary edema. An atypical infectious process is not entirely excluded and should be correlated clinically. Hold Lasix due to worsening renal failure.  Echocardiogram: Moderate to severe mitral valve stenosis.  Ejection fraction more than 65%. Continue oxygen via nasal cannula.   Moderate to severe mitral valve stenosis. For cardiology consult,  continue beta-blocker and Cardizem.  Further evaluation as outpatient.  4.  Persistent atrial fibrillation-currently remains in normal sinus rhythm.  History of TEE cardioversion in the past. -Continue oral Cardizem and metoprolol per Dr. Saunders Revel. -On warfarin for anticoagulation.  Pharmacy to adjust the dose Hold Coumadin due to possible GI bleeding.  Hyperkalemia.  Possible due to acute renal failure.  Started Veltassa for Dr. Candiss Norse.  Improved. Possible GI bleeding and bloody stool. Hold Coumadin.  Follow-up stool occult.  Supratherapeutic INR.  INR 3.8.  Hold Coumadin.  Anemia of chronic disease.  Hemoglobin decreased to 6.9.  S/P 1 unit PRBC transfusion.  Follow hemoglobin 7.9. Recommend transfusion below 8 per cardiologist. PRBC transfusion and follow-up hemoglobin.  Generalized weakness.  PT and ambulate patient.  The patient does not want to go to skilled nursing facility.  She want to go home. I discussed with Dr. Holley Raring. All the records are reviewed and case discussed with Care Management/Social Worker. Management plans discussed with the patient, her husband and they are in agreement.  CODE STATUS: Full Code  TOTAL TIME TAKING CARE OF THIS PATIENT: 33 minutes.   More than 50% of the time was spent  in counseling/coordination of care: YES  POSSIBLE D/C IN 2-3 DAYS, DEPENDING ON CLINICAL CONDITION.   Demetrios Loll M.D on 08/30/2018 at 1:40 PM  Between 7am to 6pm - Pager - [phone]  After 6pm go to [email or web address] - [facility]

## 2018-08-30 NOTE — Progress Notes (Signed)
Progress Note  Patient Name: Leslie [name] Date of Encounter: 08/30/2018  Primary Cardiologist: Ida Rogue, MD   Subjective   Continues to feel SOB on [place]. No current CP. Abdominal and flank pain improving  Inpatient Medications    Scheduled Meds: . atorvastatin  40 mg Oral q1800  . diltiazem  240 mg Oral Daily  . docusate sodium  100 mg Oral BID  . metoprolol succinate  50 mg Oral Daily  . montelukast  10 mg Oral QHS  . pantoprazole  40 mg Oral Daily  . patiromer  16.8 g Oral Daily  . tamsulosin  0.4 mg Oral QPM  . Warfarin - Pharmacist Dosing Inpatient   Does not apply q1800   Continuous Infusions: .  ceFAZolin (ANCEF) IV 1 g (08/30/18 1128)   PRN Meds: acetaminophen **OR** acetaminophen, ALPRAZolam, bisacodyl, HYDROcodone-acetaminophen, ipratropium-albuterol, ondansetron **OR** ondansetron (ZOFRAN) IV, senna   Vital Signs    Vitals:   08/30/18 0029 08/30/18 0405 08/30/18 1006 08/30/18 1150  BP:  (!) 143/72 (!) 146/71 (!) 143/77  Pulse: 96 88  88  Resp:  20  16  Temp:  98.2 F (36.8 C)  (!) 97.5 F (36.4 C)  TempSrc:  Oral  Oral  SpO2: 99% 100%  97%  Weight:  122.2 kg    Height:        Intake/Output Summary (Last 24 hours) at 08/30/2018 1220 Last data filed at 08/30/2018 0111 Gross per 24 hour  Intake 703 ml  Output -  Net 703 ml   Last 3 Weights 08/30/2018 08/29/2018 08/26/2018  Weight (lbs) 269 lb 6.4 oz 264 lb 8 oz 258 lb 2.5 oz  Weight (kg) 122.2 kg 119.976 kg 117.1 kg      Telemetry    SR/ST, ectopy with PVCs - Personally Reviewed  ECG    No new tracings - Personally Reviewed  Physical Exam   GEN: Obese female in NAD Neck: JVD difficult to assess d/t body habitus  Cardiac: RRR, 1/6 diastolic murmur, no rubs, or gallops.  Respiratory: Reduced breath sounds bilaterally  GI: Soft, nontender, non-distended  MS: Minimal bilateral edema; No deformity. Neuro:  Nonfocal  Psych: Normal affect   Labs    High Sensitivity Troponin:  No results  for input(s): TROPONINIHS in the last 720 hours.    Cardiac EnzymesNo results for input(s): TROPONINI in the last 168 hours. No results for input(s): TROPIPOC in the last 168 hours.   Chemistry Recent Labs  Lab 08/28/18 0554 08/29/18 0608 08/30/18 0615  NA 133* 135 138  K 4.3 4.5 4.2  CL 101 102 105  CO2 21* 21* 22  GLUCOSE 93 101* 99  BUN 58* 68* 65*  CREATININE 4.10* 3.79* 2.86*  CALCIUM 8.2* 8.9 9.0  GFRNONAA 12* 13* 18*  GFRAA 14* 15* 21*  ANIONGAP 11 12 11      Hematology Recent Labs  Lab 08/28/18 0554 08/29/18 0608 08/30/18 0615  WBC 15.3* 15.3* 12.4*  RBC 3.10* 3.23* 2.94*  HGB 8.3* 8.7* 7.9*  HCT 25.9* 27.4* 25.3*  MCV 83.5 84.8 86.1  MCH 26.8 26.9 26.9  MCHC 32.0 31.8 31.2  RDW 16.9* 16.9* 16.7*  PLT 220 227 227    BNP Recent Labs  Lab 08/26/18 1221 08/26/18 2024  BNP 479.0* 539.0*     DDimer No results for input(s): DDIMER in the last 168 hours.   Radiology    No results found.  Cardiac Studies   08/29/2018 Echo  1. The left ventricle has  hyperdynamic systolic function, with an ejection fraction of >65%. The cavity size was normal. Left ventricular diastolic Doppler parameters are indeterminate.  2. The right ventricle has normal systolic function. The cavity was mildly enlarged. There is no increase in right ventricular wall thickness. Right ventricular systolic pressure could not be assessed.  3. Left atrial size was mildly dilated.  4. The mitral valve was not well visualized. Moderate thickening of the mitral valve leaflet. Moderate-severe mitral valve stenosis, with mean gradient of 16 mmHg and MVA by continuity equation of 1.0 cm^2. MVA by PHT calculated at 3.5 cm^2 but is  likely an overestimation.  5. The aortic valve was not well visualized.  6. The aorta is normal in size and structure.  7. The interatrial septum was not well visualized.  09/13/2017 TEE Study Conclusions - Left ventricle: The cavity size was normal. Systolic  function was normal. The estimated ejection fraction was in the range of 60% to 65%. - Aortic valve: No evidence of vegetation. There was trivial regurgitation. - Mitral valve: Mobility of the posterior leaflet was restricted. No evidence of vegetation. Transvalvular velocity was increased. The findings are consistent with moderate stenosis. Mean gradient (D): 14 mm Hg. Planimetered valve area: 2.59 cm^2. Valve area by pressure half-time: 2.72 cm^2. Valve area (PISA): 1.12 cm^2. - Left atrium: No evidence of thrombus in the atrial cavity or appendage. - Right ventricle: The cavity size was normal. Systolic function was normal. - Right atrium: No evidence of thrombus in the atrial cavity or appendage. - Atrial septum: Doppler showed no atrial level shunt, in the baseline state. - Tricuspid valve: No evidence of vegetation. - Pulmonic valve: No evidence of vegetation.  09/12/2017 Echo Study Conclusions - Left ventricle: The cavity size was normal. Wall thickness was normal. Systolic function was normal. The estimated ejection fraction was in the range of 55% to 60%. Wall motion was normal; there were no regional wall motion abnormalities. Features are consistent with a pseudonormal left ventricular filling pattern, with concomitant abnormal relaxation and increased filling pressure (grade 2 diastolic dysfunction). - Mitral valve: Poorly visualized. The findings are consistent with moderate stenosis. Mean gradient (D): 15 mm Hg. Valve area by pressure half-time: 1.62 cm^2. Valve area by continuity equation (using LVOT flow): 2.15 cm^2. Impressions: - Significantly elevated mitral valve gradient but the valve is not well visualized. Left atrial size is normal which argues against severe stenosis. Consider a TEE if clinically indicated.  Patient Profile     53 y.o. female with hx of nonobstructive CAD by cardiac CT 02/2018, PAF on  Coumadin, chronic diastolic CHF, moderate mitral stenosis, HTN, HLD, former tobacco abuse and COPD, obesity, sleep apnea, and anxiety who is being seen today for the evaluation of progressive shortness of breath.  Assessment & Plan    Mitral valve stenosis, moderate to severe --Likely contributing to SOB. Updated echo as above. Hyperdynamic systolic function with EF >65%. Maintain low heart rate and blood pressure as above.  Avoid fluids. Continue BB, Cardizem. Mitral valve not well visualized but seen with moderate thickening with stenosis moderate to severe with mean gradient of 69mmHg. Need for further evaluation / follow-up imaging as an outpatient of valve for consideration of valve replacement with further progression of valvular disease to be discussed with patient.   Diastolic heart failure --Significant shortness of breath on nasal cannula and after receiving intravenous fluids for pyelonephritis in the emergency department.  Likely multifactorial in the setting of known mitral stenosis with current elevated  HR and BP in the setting of infection and pain and s/p fluids due to worsening renal function after R ureter stent /renal stone UTI with multiple renally processed medications, including ibuprofen and bactrim. --Updated echo as above with EF >26%, LV diastolic doppler parameters indeterminate --Recommend daily standing weights.  Wt 120kg 122kg Document I's/O's. +226 for the admission. --Diuresis was deferred at admission given AKI and continue to defer diuresis pending recovery of renal function. Renal function continues to improve 3.79  2.86 with estimated Cr 1.1-1.2.   Atrial fibrillation s/p DCCV Palpitations --Maintaining SR. Did note tachy-palpitations in the setting of worsening infection as in HPI.  On PTA warfarin for valvular Afib with no recent evidence of recurrence as above. --Continue rate control as above with BB and Cardizem for tachypalpitations. --Continue warfarin per  pharmacy.  --Monitor INR. Daily CBC. Monitor electrolytes with current K 4.5. Check Mg. 8/8 TSH 0.864.  Acute on chronic kidney disease --As above, worsening renal function in the setting of recent ureter stent/renal stone with subsequent UTI and multiple renally processed medications with volume overload. --Avoid nephrotoxic agents at this time.  Renally dose medications.  Avoid contrast. --Daily BMET. Baseline estimated at Cr 1.1-1.2.   HTN --Continue medical management. BP sub-optimal at this time with SBP 150s and control stressed due to mitral stenosis.  HLD --Continue statin therapy. 03/2018 LDL 109 and could consider up titration of statin as outpatient.   CAD --Chest tightness noted on exam today. Cardiac CTA 02/2018 as above. --Will continue to monitor, given recent office visit with c/o stabbing chest pain below her R breast at rest, though thought to be atypical and etiology likely due to volume overload verus MSK per anxiety per documentation.  --EKG at admission without acute changes.  --Chest tightness likely in the setting of volume overload and work with breathing.   --Invasive ischemic workup not indicated at this time unless further CP or  EF significantly reduced or acute structural changes noted on echo.   Anemia --Hgb 8.7. Monitor.  Recommend transfusion below 8. Appears as if Hgb steadily dropping since ~2016. Further workup if needed per IM.   For questions or updates, please contact [place] Please consult [email or web address] for contact info under        Signed, Arvil Chaco, PA-C  08/30/2018, 12:20 PM

## 2018-08-31 LAB — CBC
HCT: 29 % — ABNORMAL LOW (ref 36.0–46.0)
Hemoglobin: 9 g/dL — ABNORMAL LOW (ref 12.0–15.0)
MCH: 26.8 pg (ref 26.0–34.0)
MCHC: 31 g/dL (ref 30.0–36.0)
MCV: 86.3 fL (ref 80.0–100.0)
Platelets: 257 K/uL (ref 150–400)
RBC: 3.36 MIL/uL — ABNORMAL LOW (ref 3.87–5.11)
RDW: 16.4 % — ABNORMAL HIGH (ref 11.5–15.5)
WBC: 12.5 K/uL — ABNORMAL HIGH (ref 4.0–10.5)
nRBC: 0.2 % (ref 0.0–0.2)

## 2018-08-31 LAB — BPAM RBC
Blood Product Expiration Date: 202008242359
Blood Product Expiration Date: 202008312359
ISSUE DATE / TIME: 202008091244
ISSUE DATE / TIME: 202008121531
Unit Type and Rh: 6200
Unit Type and Rh: 6200

## 2018-08-31 LAB — PROTIME-INR
INR: 2.9 — ABNORMAL HIGH (ref 0.8–1.2)
Prothrombin Time: 30 s — ABNORMAL HIGH (ref 11.4–15.2)

## 2018-08-31 LAB — CULTURE, BLOOD (ROUTINE X 2)
Culture: NO GROWTH
Culture: NO GROWTH
Special Requests: ADEQUATE
Special Requests: ADEQUATE

## 2018-08-31 LAB — TYPE AND SCREEN
ABO/RH(D): A POS
Antibody Screen: NEGATIVE
Unit division: 0
Unit division: 0

## 2018-08-31 LAB — BASIC METABOLIC PANEL WITH GFR
Anion gap: 11 (ref 5–15)
BUN: 62 mg/dL — ABNORMAL HIGH (ref 6–20)
CO2: 24 mmol/L (ref 22–32)
Calcium: 8.7 mg/dL — ABNORMAL LOW (ref 8.9–10.3)
Chloride: 106 mmol/L (ref 98–111)
Creatinine, Ser: 2.38 mg/dL — ABNORMAL HIGH (ref 0.44–1.00)
GFR calc Af Amer: 26 mL/min — ABNORMAL LOW (ref >60)
GFR calc non Af Amer: 23 mL/min — ABNORMAL LOW (ref >60)
Glucose, Bld: 102 mg/dL — ABNORMAL HIGH (ref 70–99)
Potassium: 4.1 mmol/L (ref 3.5–5.1)
Sodium: 141 mmol/L (ref 135–145)

## 2018-08-31 LAB — URIC ACID: Uric Acid, Serum: 9.3 mg/dL — ABNORMAL HIGH (ref 2.5–7.1)

## 2018-08-31 LAB — OCCULT BLOOD X 1 CARD TO LAB, STOOL: Fecal Occult Bld: POSITIVE — AB

## 2018-08-31 MED ORDER — PREDNISONE 20 MG PO TABS
40.0000 mg | ORAL_TABLET | Freq: Every day | ORAL | Status: DC
  Administered 2018-08-31 – 2018-09-01 (×2): 40 mg via ORAL
  Filled 2018-08-31 (×2): qty 2

## 2018-08-31 MED ORDER — WARFARIN 0.5 MG HALF TABLET
0.5000 mg | ORAL_TABLET | Freq: Once | ORAL | Status: AC
  Administered 2018-08-31: 17:00:00 0.5 mg via ORAL
  Filled 2018-08-31: qty 1

## 2018-08-31 MED ORDER — CEFAZOLIN SODIUM-DEXTROSE 1-4 GM/50ML-% IV SOLN
1.0000 g | Freq: Three times a day (TID) | INTRAVENOUS | Status: DC
  Administered 2018-08-31 – 2018-09-01 (×3): 1 g via INTRAVENOUS
  Filled 2018-08-31 (×5): qty 50

## 2018-08-31 MED ORDER — PATIROMER SORBITEX CALCIUM 8.4 G PO PACK
8.4000 g | PACK | Freq: Every day | ORAL | Status: DC
  Administered 2018-09-01: 8.4 g via ORAL
  Filled 2018-08-31: qty 1

## 2018-08-31 NOTE — Progress Notes (Signed)
Occupational Therapy Treatment Patient Details Name: Leslie [name] MRN: [id number] DOB: 11/23/1965 Today's Date: 08/31/2018    History of present illness 53 y.o. female with a known history of persistent atrial fibrillation status post TEE cardioversion on Coumadin, hypertension, history of interstitial lung disease, diastolic heart failure, GERD, obesity and kidney stones presented to hospital secondary to worsening abdominal pain, nausea, shortness of breath and dysuria. Work up for sepsis likely due to acute pyelonephritis; stent removal 1 week ago and acute renal failure. Pt  s/p transfusion during hospital stay as well. INR 4.   OT comments  Pt seen for OT tx this date. Pt seated EOB upon OT's arrival, spouse present and pt eager to participate. Pt reporting feeling better. Supplemental O2 off. Pt reports feeling fine. O2 sat >97% on room air, RN aware. Pt and spouse instructed in energy conservation strategies with review of handout and previous session's instruction to maximize recall and carryover with additional education in incorporating strategies into pt's daily routines including basic ADL and meal prep. Pt and spouse verbalized understanding. Pt progressing towards goals, continues to benefit from skilled OT but d/c rec upgraded to [facility] services based on pt's progress to date.    Follow Up Recommendations  Home health OT    Equipment Recommendations  3 in 1 bedside commode    Recommendations for Other Services      Precautions / Restrictions Precautions Precautions: Fall Restrictions Weight Bearing Restrictions: No       Mobility Bed Mobility Overal bed mobility: Modified Independent                Transfers Overall transfer level: Needs assistance Equipment used: 1 person hand held assist Transfers: Sit to/from Stand Sit to Stand: Min guard;Supervision              Balance Overall balance assessment: Needs assistance Sitting-balance support: Feet  supported;No upper extremity supported Sitting balance-Leahy Scale: Good     Standing balance support: Bilateral upper extremity supported Standing balance-Leahy Scale: Fair                             ADL either performed or assessed with clinical judgement   ADL Overall ADL's : Needs assistance/impaired                                       General ADL Comments: Continues to require some assist for ADL but improving - PRN Min A for LB ADL, CGA for toilet transfers     Vision Baseline Vision/History: Wears glasses Wears Glasses: At all times Patient Visual Report: No change from baseline Vision Assessment?: No apparent visual deficits   Perception     Praxis      Cognition Arousal/Alertness: Awake/alert Behavior During Therapy: WFL for tasks assessed/performed Overall Cognitive Status: Within Functional Limits for tasks assessed                                          Exercises Other Exercises Other Exercises: Pt and spouse instructed in energy conservation strategies with review of handout and previous session's instruction to maximize recall and carryover with additional education in incorporating strategies into pt's daily routines including basic ADL and meal prep   Shoulder Instructions  General Comments Pt seated EOB upon arrival, nasal canula off; O2 sats >97% on room air, RN aware    Pertinent Vitals/ Pain       Pain Assessment: No/denies pain  Home Living                                          Prior Functioning/Environment              Frequency  Min 1X/week        Progress Toward Goals  OT Goals(current goals can now be found in the care plan section)  Progress towards OT goals: Progressing toward goals  Acute Rehab OT Goals Patient Stated Goal: to get better OT Goal Formulation: With patient Time For Goal Achievement: 09/13/18 Potential to Achieve Goals: Good   Plan Discharge plan needs to be updated;Frequency remains appropriate    Co-evaluation                 AM-PAC OT "6 Clicks" Daily Activity     Outcome Measure   Help from another person eating meals?: None Help from another person taking care of personal grooming?: None Help from another person toileting, which includes using toliet, bedpan, or urinal?: A Little Help from another person bathing (including washing, rinsing, drying)?: A Little Help from another person to put on and taking off regular upper body clothing?: None Help from another person to put on and taking off regular lower body clothing?: A Little 6 Click Score: 21    End of Session    OT Visit Diagnosis: Muscle weakness (generalized) (M62.81);Other abnormalities of gait and mobility (R26.89)   Activity Tolerance Patient tolerated treatment well   Patient Left in bed;with call bell/phone within reach;Other (comment)(seated EOB with spouse in room, RN aware)   Nurse Communication          Time: 1779-3903 OT Time Calculation (min): 13 min  Charges: OT General Charges $OT Visit: 1 Visit OT Treatments $Self Care/Home Management : 8-22 mins  Jeni Salles, MPH, MS, OTR/L ascom [phone] 08/31/18, 2:42 PM

## 2018-08-31 NOTE — Progress Notes (Signed)
ANTICOAGULATION CONSULT NOTE   Pharmacy Consult for Warfarin  Indication: atrial fibrillation  Patient Measurements: Height: 5' 4" (162.6 cm) Weight: 262 lb 6.4 oz (119 kg) IBW/kg (Calculated) : 54.7  Vital Signs: Temp: 97.5 F (36.4 C) (08/13 0453) Temp Source: Oral (08/13 0453) BP: 144/75 (08/13 0844) Pulse Rate: 92 (08/13 0844)  Labs: Recent Labs    08/29/18 0608 08/30/18 0615 08/31/18 0552  HGB 8.7* 7.9* 9.0*  HCT 27.4* 25.3* 29.0*  PLT 227 227 257  LABPROT 38.7* 36.7* 30.0*  INR 4.0* 3.8* 2.9*  CREATININE 3.79* 2.86* 2.38*    Estimated Creatinine Clearance: 35.1 mL/min (A) (by C-G formula based on SCr of 2.38 mg/dL (H)).   Medical History: Past Medical History:  Diagnosis Date  . (HFpEF) heart failure with preserved ejection fraction (HCC)    a. 08/2017 Echo: EF 55-60%.  Grade 2 diastolic dysfunction.  Moderate mitral stenosis.  . Allergy   . Anemia   . Anxiety   . BRCA negative 03/22/2013  . Bronchitis 02/19/2016   ON LEVAQUIN PO  . Chest tightness   . CHF (congestive heart failure) (HCC)   . Cigarette smoker 09/11/2017   8-10 day  . GERD (gastroesophageal reflux disease)   . History of kidney stones   . Hyperlipidemia   . Hypertension   . Interstitial lung disease (HCC)    a. CT 2013 b. 02/2018 CXR noted recurrent intersistial changes ILD vs chronic bronchitis  . Moderate mitral stenosis    a.  08/2017 TEE: EF 60 to 65%.  Moderate mitral stenosis.  Mean gradient 14 mmHg.  Valve area 2.59 cm by planimetry, 2.72 cm by pressure half-time.  . Palpitations   . Persistent atrial fibrillation    a. 08/2017 s/p TEE/DCCV; b. CHA2DS2VASc = 2-->warfarin.    Medications:  Medications Prior to Admission  Medication Sig Dispense Refill Last Dose  . albuterol (VENTOLIN HFA) 108 (90 Base) MCG/ACT inhaler Inhale 2 puffs into the lungs every 4 (four) hours as needed for wheezing or shortness of breath. 3 Inhaler 0 Unknown at PRN  . ALPRAZolam (XANAX) 0.5 MG tablet  Take 1 tablet (0.5 mg total) by mouth at bedtime as needed for anxiety. 30 tablet 2 Unknown at PRN  . atorvastatin (LIPITOR) 40 MG tablet Take 1 tablet (40 mg total) by mouth daily at 6 PM. 90 tablet 3 08/25/2018 at 1800  . celecoxib (CELEBREX) 200 MG capsule Take 200 mg by mouth at bedtime as needed for mild pain.    Unknown at PRN  . estradiol (ESTRACE) 0.5 MG tablet Take 1 tablet (0.5 mg total) by mouth daily. 90 tablet 1 08/26/2018 at 0730  . metoprolol succinate (TOPROL-XL) 25 MG 24 hr tablet Take 1 tablet (25 mg total) by mouth daily. Take with or immediately following a meal. 90 tablet 0 08/26/2018 at 0730  . montelukast (SINGULAIR) 10 MG tablet Take 1 tablet (10 mg total) by mouth at bedtime. 90 tablet 2 08/25/2018 at 2100  . naproxen sodium (ALEVE) 220 MG tablet Take 440 mg by mouth daily as needed (pain).   Unknown at PRN  . omeprazole (PRILOSEC) 40 MG capsule Take 1 capsule (40 mg total) by mouth daily. 90 capsule 1 08/26/2018 at 0730  . potassium chloride (K-DUR) 10 MEQ tablet Take 2 tablets (20 meq) by mouth once daily (Patient taking differently: Take 20 mEq by mouth daily. ) 180 tablet 2 08/25/2018 at 0700  . sulfamethoxazole-trimethoprim (BACTRIM DS) 800-160 MG tablet Take 1 tablet by mouth   daily. 4 tablet 0 08/25/2018 at 1800  . tamsulosin (FLOMAX) 0.4 MG CAPS capsule Take 0.4 mg by mouth every evening.    08/25/2018 at 1800  . warfarin (COUMADIN) 2.5 MG tablet TAKE 1/2 TO 1 TABLET DAILY AS DIRECTED BY COUMADIN CLINIC. (Patient taking differently: Take 1.25-2.5 mg by mouth See admin instructions. Take 2.5 mg at night on Mon, Wed, and Fri.  Take 1.25 mg at night on Sun, Tue, Thur, and Sat) 90 tablet 1 08/25/2018 at 2100  . chlorpheniramine-HYDROcodone (TUSSIONEX PENNKINETIC ER) 10-8 MG/5ML SUER Take 5 mLs by mouth every 12 (twelve) hours as needed for cough. (Patient not taking: Reported on 08/26/2018) 115 mL 0 Not Taking at Unknown time    Assessment: Pharmacy consulted to dose warfarin in this 53 year  old female with Afib.  Pt was on Warfarin 2.5 mg PO MWF and warfarin 1.25 mg PO T-Th-Sat-Sun.   DDIs: cefazolin and Veltassa  It appears she was on Bactrim PTA s/p R ureteral stent ~1 week ago.    H&H down slightly, PLT wnl Patient is s/p 1 unit PRBC on 8/9.   Date  INR  Dose 8/8   2.6    1.25 mg 8/9  4.5    HOLD  8/10  5.2  HOLD 8/11  4.0  HOLD 8/12  3.8  HOLD 8/13  2.9  0.5 mg  Goal of Therapy:  INR 2-3   Plan:   Given previous response to 1.25 mg dose, will give a small dose of 0.5 mg today   recheck INR with AM labs  Khyan Oats D Ravon Mcilhenny, PharmD  Clinical Pharmacist 08/31/2018,8:55 AM  

## 2018-08-31 NOTE — Progress Notes (Signed)
[facility] [place] NAME: Leslie [name]    MR#:  [id number]  DATE OF BIRTH:  07/24/1965  SUBJECTIVE:  CHIEF COMPLAINT:   Chief Complaint  Patient presents with   Flank Pain   Patient has better right abdominal pain and flank pain.  She still has shortness of breath on exertion.  She is on oxygen by nasal cannula 2 L due to hypoxia.  REVIEW OF SYSTEMS:  Review of Systems  Constitutional: Positive for malaise/fatigue. Negative for chills and fever.  HENT: Negative for sore throat.   Eyes: Negative for blurred vision and double vision.  Respiratory: Positive for shortness of breath. Negative for cough, hemoptysis, wheezing and stridor.   Cardiovascular: Negative for chest pain, palpitations, orthopnea and leg swelling.  Gastrointestinal: Positive for abdominal pain. Negative for blood in stool, constipation, diarrhea, melena, nausea and vomiting.  Genitourinary: Negative for dysuria, flank pain and hematuria.  Musculoskeletal: Negative for back pain and joint pain.  Skin: Negative for rash.  Neurological: Negative for dizziness, sensory change, focal weakness, seizures, loss of consciousness, weakness and headaches.  Endo/Heme/Allergies: Negative for polydipsia.  Psychiatric/Behavioral: Negative for depression. The patient is not nervous/anxious.     DRUG ALLERGIES:   Allergies  Allergen Reactions   Morphine Hives and Rash   Oxycodone Hcl Hives and Itching   Dilaudid [Hydromorphone Hcl] Itching   VITALS:  Blood pressure (!) 140/96, pulse 78, temperature 97.9 F (36.6 C), temperature source Oral, resp. rate 18, height 5\' 4"  (1.626 m), weight 119 kg, SpO2 100 %. PHYSICAL EXAMINATION:  Physical Exam Constitutional:      General: She is not in acute distress. HENT:     Head: Normocephalic.     Mouth/Throat:     Mouth: Mucous membranes are moist.  Eyes:     General: No scleral icterus.    Conjunctiva/sclera: Conjunctivae  normal.     Pupils: Pupils are equal, round, and reactive to light.  Neck:     Musculoskeletal: Normal range of motion and neck supple.     Vascular: No JVD.     Trachea: No tracheal deviation.  Cardiovascular:     Rate and Rhythm: Normal rate and regular rhythm.     Heart sounds: Murmur present. No gallop.   Pulmonary:     Effort: Pulmonary effort is normal. No respiratory distress.     Breath sounds: Normal breath sounds. No wheezing or rales.  Abdominal:     General: Bowel sounds are normal. There is no distension.     Palpations: Abdomen is soft.     Tenderness: There is abdominal tenderness. There is no right CVA tenderness or rebound.  Musculoskeletal: Normal range of motion.        General: No tenderness.     Right lower leg: No edema.     Left lower leg: No edema.  Skin:    Findings: No erythema or rash.  Neurological:     Mental Status: She is alert and oriented to person, place, and time.     Cranial Nerves: No cranial nerve deficit.  Psychiatric:        Mood and Affect: Mood normal.    LABORATORY PANEL:  Female CBC Recent Labs  Lab 08/31/18 0552  WBC 12.5*  HGB 9.0*  HCT 29.0*  PLT 257   ------------------------------------------------------------------------------------------------------------------ Chemistries  Recent Labs  Lab 08/29/18 0608  08/31/18 0552  NA 135   < > 141  K 4.5   < >  4.1  CL 102   < > 106  CO2 21*   < > 24  GLUCOSE 101*   < > 102*  BUN 68*   < > 62*  CREATININE 3.79*   < > 2.38*  CALCIUM 8.9   < > 8.7*  MG 2.6*  --   --    < > = values in this interval not displayed.   RADIOLOGY:  No results found. ASSESSMENT AND PLAN:   Leslie [name]  is a 53 y.o. female with a known history of persistent atrial fibrillation status post TEE cardioversion on Coumadin, hypertension, history of interstitial lung disease, diastolic heart failure, GERD, obesity and kidney stones presents to hospital secondary to worsening abdominal pain, nausea,  shortness of breath and dysuria.  1.  Sepsis-likely secondary to acute pyelonephritis on the right side -Also had a recent right ureteral stent 1 week ago which has been removed now. Follow-up blood cultures.  Lactic acid is within normal limits. She has been treated with meropenem-given complicated UTI and recent use of Bactrim as outpatient. Urine culture >=100,000 COLONIES/mL ESCHERICHIA COLI. Changed to Ancef IV.  Leukocytosis is improving.   2.  Acute renal failure on CKD stage III-Baseline creatinine seems to be around 1.2.  Worsening. -Also has been taking Toradol for her back pain as needed -Avoid nephrotoxins -No recent IV contrast -CT of the abdomen showing enlargement of right kidney with some perinephric edema, no obstructing calculus noted.  Repeat renal ultrasound report no hydronephrosis. Hold Lasix. The patient has been treated with gentle rehydration with fluids. Hold IV fluid due to hypoxia. Renal function is improving slowly.  Hyponatremia.  Improved.  3.    Acute respiratory failure with hypoxia due to acute on chronic diastolic CHF.  -Chest x-ray showing bibasilar increased interstitial markings could be pulmonary edema versus atypical pneumonitis. -Chest x-ray from February also showing coarse interstitial markings.  Possible chronic interstitial lung disease.  Prior history of smoking -Check BNP.  1 dose of IV Lasix being given -Known history of diastolic heart failure with last echo showing normal ejection fraction.  Also complains of 1+ pedal edema in the last week. -Plain CT chest without contrast: Overall findings concerning for developing pulmonary edema. An atypical infectious process is not entirely excluded and should be correlated clinically. Hold Lasix due to worsening renal failure.  Echocardiogram: Moderate to severe mitral valve stenosis.  Ejection fraction more than 65%. Continue oxygen via nasal cannula.  Still hold Lasix per Dr.  Rockey Situ.  Moderate to severe mitral valve stenosis. For cardiology consult, continue beta-blocker and Cardizem.  Further evaluation as outpatient.  4.  Persistent atrial fibrillation-currently remains in normal sinus rhythm.  History of TEE cardioversion in the past. -Continue oral Cardizem and metoprolol per Dr. Saunders Revel. -On warfarin for anticoagulation.  Pharmacy to adjust the dose Hold Coumadin due to possible GI bleeding.  Hyperkalemia.  Possible due to acute renal failure.  Started V[place] for Dr. Candiss Norse.  Improved. The patient has no signs of bleeding. Coumadin pharmacy to dose.  Supratherapeutic INR.  INR down to 2.9.  Coumadin pharmacy to dose.  Anemia of chronic disease.  Hemoglobin decreased to 6.9.  S/P 1 unit PRBC transfusion.  Follow hemoglobin 7.9. Recommend transfusion below 8 per cardiologist. PRBC transfusion and follow-up hemoglobin.  Generalized weakness.  PT and ambulate patient.  The patient needs home health and PT. I discussed with Dr. Rockey Situ. All the records are reviewed and case discussed with Care Management/Social Worker. Management plans  discussed with the patient, her husband and they are in agreement.  CODE STATUS: Full Code  TOTAL TIME TAKING CARE OF THIS PATIENT: 35 minutes.   More than 50% of the time was spent in counseling/coordination of care: YES  POSSIBLE D/C IN 2-3 DAYS, DEPENDING ON CLINICAL CONDITION.   Demetrios Loll M.D on 08/31/2018 at 1:47 PM  Between 7am to 6pm - Pager - [phone]  After 6pm go to [email or web address] - [facility]

## 2018-08-31 NOTE — TOC Initial Note (Addendum)
Transition of Care [facility]) - Initial/Assessment Note    Patient Details  Name: Leslie [name] MRN: [id number] Date of Birth: Feb 17, 1965  Transition of Care [facility]) CM/SW Contact:    Candie Chroman, LCSW Phone Number: 08/31/2018, 12:56 PM  Clinical Narrative: CSW met with patient, introduced role, and explained that PT recommendations would be discussed. Patient confirmed she is not interested in SNF placement. She is agreeable to HHPT and OT but does not feel the need to have a nurse or aide. CSW explained that home health will have to be arranged through her insurance company. CSW called [employer]. They need an anticipated discharge date before they can set her up. CSW sent MD a message to see we have an anticipated dc date yet. PT and OT are recommending a rolling walker and 3-in-1. Patient is able to get to the bathroom. She has a rolling walker and a shower chair at home. Patient is not on oxygen at home. Will continue to follow for that need. No further concerns. CSW encouraged patient to contact CSW as needed. CSW will continue to follow patient for support and facilitate return home once stable.                 2:42 pm: Anticipated discharge date is Sunday. [employer] started insurance authorization for home health PT and OT. CSW faxed clinicals and home health orders. Intake ID # is [id number].  Expected Discharge Plan: [place] Barriers to Discharge: Continued Medical Work up   Patient Goals and CMS Choice        Expected Discharge Plan and Services Expected Discharge Plan: [place] Acute Care Choice: [place] arrangements for the past 2 months: Single Family Home Expected Discharge Date: 08/26/18               DME Arranged: N/A         HH Arranged: PT, OT          Prior Living Arrangements/Services Living arrangements for the past 2 months: Single Family Home Lives with:: Adult Children,  Spouse Patient language and need for interpreter reviewed:: Yes Do you feel safe going back to the place where you live?: Yes      Need for Family Participation in Patient Care: Yes (Comment) Care giver support system in place?: Yes (comment)   Criminal Activity/Legal Involvement Pertinent to Current Situation/Hospitalization: No - Comment as needed  Activities of Daily Living Home Assistive Devices/Equipment: None ADL Screening (condition at time of admission) Patient's cognitive ability adequate to safely complete daily activities?: Yes Is the patient deaf or have difficulty hearing?: No Does the patient have difficulty seeing, even when wearing glasses/contacts?: No Does the patient have difficulty concentrating, remembering, or making decisions?: No Patient able to express need for assistance with ADLs?: Yes Does the patient have difficulty dressing or bathing?: No Independently performs ADLs?: Yes (appropriate for developmental age) Does the patient have difficulty walking or climbing stairs?: Yes Weakness of Legs: None Weakness of Arms/Hands: None  Permission Sought/Granted                  Emotional Assessment Appearance:: Appears stated age Attitude/Demeanor/Rapport: Engaged, Gracious Affect (typically observed): Accepting, Appropriate, Calm, Pleasant Orientation: : Oriented to Self, Oriented to Place, Oriented to  Time, Oriented to Situation Alcohol / Substance Use: Tobacco Use(Listed in history) Psych Involvement: No (comment)  Admission diagnosis:  Pyelonephritis [N12] AKI (acute kidney injury) (  [place]) [N17.9] Patient Active Problem List   Diagnosis Date Noted  . Acute on chronic heart failure with preserved ejection fraction (HFpEF) ([place])   . AKI (acute kidney injury) ([place])   . Sepsis ([place]) 08/26/2018  . (HFpEF) heart failure with preserved ejection fraction ([place])   . Renal calculus, right 07/03/2018  . Low back pain 06/27/2018  . Bacterial vaginitis  06/27/2018  . Pedal edema 06/27/2018  . Coronary artery disease, non-occlusive 04/10/2018  . Acute on chronic diastolic CHF (congestive heart failure) ([place]) 04/10/2018  . Encounter for other preprocedural examination 03/16/2018  . Community acquired pneumonia 02/16/2018  . Elevated brain natriuretic peptide (BNP) level 02/16/2018  . Abnormal renal function 02/16/2018  . Acute recurrent pansinusitis 01/13/2018  . Candidiasis 01/13/2018  . Anxiety 01/13/2018  . Osteoarthritis of knee 01/09/2018  . Morbid obesity with body mass index (BMI) of 40.0 or higher ([place]) 11/13/2017  . Major depressive disorder 09/22/2017  . Mitral valve stenosis   . Atrial fibrillation ([place]) 09/11/2017  . Generalized abdominal pain 09/08/2017  . Episode of recurrent major depressive disorder ([place]) 09/08/2017  . Hypertension 06/06/2017  . Cough 06/06/2017  . Tobacco dependency 06/06/2017  . Urinary tract infection without hematuria 04/28/2017  . Acute upper respiratory infection 04/28/2017  . Flu-like symptoms 04/28/2017  . Dysuria 04/28/2017  . Vaginal candidiasis 04/28/2017  . Pain of left lower extremity 02/11/2016  . 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 Fibrocystic breast disease 03/23/2013  . Family history of breast cancer 07/20/2012   PCP:  Ronnell Freshwater, NP Pharmacy:   [id number] [place] [place] [id number]: [phone]: [phone] [facility] [place]Phone: [phone]: [phone]    Social Determinants of Health (SDOH) Interventions    Readmission Risk Interventions No flowsheet data found.

## 2018-08-31 NOTE — Progress Notes (Signed)
Physical Therapy Treatment Patient Details Name: Leslie [name] MRN: [id number] DOB: 08/24/1965 Today's Date: 08/31/2018    History of Present Illness 53 y.o. female with a known history of persistent atrial fibrillation status post TEE cardioversion on Coumadin, hypertension, history of interstitial lung disease, diastolic heart failure, GERD, obesity and kidney stones presented to hospital secondary to worsening abdominal pain, nausea, shortness of breath and dysuria. Work up for sepsis likely due to acute pyelonephritis; stent removal 1 week ago and acute renal failure. Pt  s/p transfusion during hospital stay as well. INR 4.    PT Comments    Pt in bed, ready for session.  Stated she is walking to bathroom with nursing.  Out of bed without assist and is able to walk to commode with light touch assist on walls and sink.  Stood without assist and washed hands without difficulty.  She is able to progress gait in hallway with walker 100' with min guard.  Pt on 2 lpm during activity but stayed 98% and HR 105.  She does continue with some balance deficits but did not need interventions and stated she felt more confident with RW.  She continues to need +1 assist for general safety but overall progressing well.  Will update discharge recommendations to reflect progress.   Follow Up Recommendations  Home health PT     Equipment Recommendations  Rolling walker with 5" wheels    Recommendations for Other Services       Precautions / Restrictions Precautions Precautions: Fall Restrictions Weight Bearing Restrictions: No    Mobility  Bed Mobility Overal bed mobility: Modified Independent                Transfers Overall transfer level: Needs assistance Equipment used: 1 person hand held assist Transfers: Sit to/from Stand Sit to Stand: Min guard;Supervision            Ambulation/Gait Ambulation/Gait assistance: Min guard Gait Distance (Feet): 100 Feet Assistive device:  Rolling walker (2 wheeled) Gait Pattern/deviations: Step-through pattern Gait velocity: decreased   General Gait Details: feels more comfortable with RW at this time   Stairs             Wheelchair Mobility    Modified Rankin (Stroke Patients Only)       Balance Overall balance assessment: Needs assistance Sitting-balance support: Feet supported Sitting balance-Leahy Scale: Good     Standing balance support: Bilateral upper extremity supported Standing balance-Leahy Scale: Fair                              Cognition Arousal/Alertness: Awake/alert Behavior During Therapy: WFL for tasks assessed/performed Overall Cognitive Status: Within Functional Limits for tasks assessed                                        Exercises      General Comments        Pertinent Vitals/Pain Pain Assessment: No/denies pain    Home Living                      Prior Function            PT Goals (current goals can now be found in the care plan section) Progress towards PT goals: Progressing toward goals    Frequency    Min 2X/week  PT Plan Discharge plan needs to be updated    Co-evaluation              AM-PAC PT "6 Clicks" Mobility   Outcome Measure  Help needed turning from your back to your side while in a flat bed without using bedrails?: A Little Help needed moving from lying on your back to sitting on the side of a flat bed without using bedrails?: A Little Help needed moving to and from a bed to a chair (including a wheelchair)?: A Little Help needed standing up from a chair using your arms (e.g., wheelchair or bedside chair)?: A Little Help needed to walk in hospital room?: A Little Help needed climbing 3-5 steps with a railing? : A Little 6 Click Score: 18    End of Session Equipment Utilized During Treatment: Gait belt;Oxygen Activity Tolerance: Patient tolerated treatment well Patient left: in  chair;with chair alarm set;with call bell/phone within reach Nurse Communication: Other (comment)       Time: 0932-3557 PT Time Calculation (min) (ACUTE ONLY): 12 min  Charges:  $Gait Training: 8-22 mins                    Leslie [name], PTA 08/31/18, 1:03 PM

## 2018-08-31 NOTE — Progress Notes (Signed)
Progress Note  Patient Name: Leslie [name] Date of Encounter: 08/31/2018  Primary Cardiologist: Ida Rogue, MD   Subjective   Improved yet still persistent shortness of breath, now on 1.5 L nasal cannula oxygen.  Reported she feels DOE on ambulation to the restroom, which is mainly when she notices her shortness of breath.  No reported chest pain, palpitations, or feeling of racing heart rate despite elevated rate.  She reported improved appetite, nausea, and lower back pain.  She noted continued lower extremity edema with concerned that her right lower extremity edema is greater than that of her left. She reported that this asymmetrical edema has been ongoing for a couple of weeks with increased associated pain in toe and ankle.  Inpatient Medications    Scheduled Meds: . atorvastatin  40 mg Oral q1800  . diltiazem  240 mg Oral Daily  . docusate sodium  100 mg Oral BID  . metoprolol succinate  100 mg Oral Daily  . montelukast  10 mg Oral QHS  . pantoprazole  40 mg Oral Daily  . patiromer  16.8 g Oral Daily  . tamsulosin  0.4 mg Oral QPM  . Warfarin - Pharmacist Dosing Inpatient   Does not apply q1800   Continuous Infusions: .  ceFAZolin (ANCEF) IV     PRN Meds: acetaminophen **OR** acetaminophen, ALPRAZolam, bisacodyl, HYDROcodone-acetaminophen, ipratropium-albuterol, ondansetron **OR** ondansetron (ZOFRAN) IV, senna   Vital Signs    Vitals:   08/30/18 2002 08/31/18 0453 08/31/18 0500 08/31/18 0844  BP: (!) 141/70 138/75  (!) 144/75  Pulse: 82 84  92  Resp: 20 20    Temp: 98 F (36.7 C) (!) 97.5 F (36.4 C)    TempSrc: Oral Oral    SpO2: 100% 100%    Weight:   119 kg   Height:        Intake/Output Summary (Last 24 hours) at 08/31/2018 1000 Last data filed at 08/31/2018 0453 Gross per 24 hour  Intake 350 ml  Output 1000 ml  Net -650 ml   Last 3 Weights 08/31/2018 08/30/2018 08/29/2018  Weight (lbs) 262 lb 6.4 oz 269 lb 6.4 oz 264 lb 8 oz  Weight (kg) 119.024  kg 122.2 kg 119.976 kg      Telemetry    SR/ST, ectopy with PVCs - Personally Reviewed  ECG    No new tracings - Personally Reviewed  Physical Exam   GEN: Obese female in NAD. Sitting of edge of bed. Neck: JVD difficult to assess d/t body habitus  Cardiac: RRR, 1/6 diastolic murmur, no rubs, or gallops.  Respiratory: Reduced breath sounds bilaterally  GI: Somewhat firm and slightly distended, non-tender MS: Minimal bilateral edema with R pedal edema greater than that of L and without erythema or warmth; No deformity. Neuro:  Nonfocal  Psych: Normal affect   Labs    High Sensitivity Troponin:  No results for input(s): TROPONINIHS in the last 720 hours.    Cardiac EnzymesNo results for input(s): TROPONINI in the last 168 hours. No results for input(s): TROPIPOC in the last 168 hours.   Chemistry Recent Labs  Lab 08/29/18 0608 08/30/18 0615 08/31/18 0552  NA 135 138 141  K 4.5 4.2 4.1  CL 102 105 106  CO2 21* 22 24  GLUCOSE 101* 99 102*  BUN 68* 65* 62*  CREATININE 3.79* 2.86* 2.38*  CALCIUM 8.9 9.0 8.7*  GFRNONAA 13* 18* 23*  GFRAA 15* 21* 26*  ANIONGAP 12 11 11      Hematology  Recent Labs  Lab 08/29/18 0608 08/30/18 0615 08/31/18 0552  WBC 15.3* 12.4* 12.5*  RBC 3.23* 2.94* 3.36*  HGB 8.7* 7.9* 9.0*  HCT 27.4* 25.3* 29.0*  MCV 84.8 86.1 86.3  MCH 26.9 26.9 26.8  MCHC 31.8 31.2 31.0  RDW 16.9* 16.7* 16.4*  PLT 227 227 257    BNP Recent Labs  Lab 08/26/18 1221 08/26/18 2024  BNP 479.0* 539.0*     DDimer No results for input(s): DDIMER in the last 168 hours.   Radiology    No results found.  Cardiac Studies   08/29/2018 Echo  1. The left ventricle has hyperdynamic systolic function, with an ejection fraction of >65%. The cavity size was normal. Left ventricular diastolic Doppler parameters are indeterminate.  2. The right ventricle has normal systolic function. The cavity was mildly enlarged. There is no increase in right ventricular wall  thickness. Right ventricular systolic pressure could not be assessed.  3. Left atrial size was mildly dilated.  4. The mitral valve was not well visualized. Moderate thickening of the mitral valve leaflet. Moderate-severe mitral valve stenosis, with mean gradient of 16 mmHg and MVA by continuity equation of 1.0 cm^2. MVA by PHT calculated at 3.5 cm^2 but is  likely an overestimation.  5. The aortic valve was not well visualized.  6. The aorta is normal in size and structure.  7. The interatrial septum was not well visualized.  09/13/2017 TEE Study Conclusions - Left ventricle: The cavity size was normal. Systolic function was normal. The estimated ejection fraction was in the range of 60% to 65%. - Aortic valve: No evidence of vegetation. There was trivial regurgitation. - Mitral valve: Mobility of the posterior leaflet was restricted. No evidence of vegetation. Transvalvular velocity was increased. The findings are consistent with moderate stenosis. Mean gradient (D): 14 mm Hg. Planimetered valve area: 2.59 cm^2. Valve area by pressure half-time: 2.72 cm^2. Valve area (PISA): 1.12 cm^2. - Left atrium: No evidence of thrombus in the atrial cavity or appendage. - Right ventricle: The cavity size was normal. Systolic function was normal. - Right atrium: No evidence of thrombus in the atrial cavity or appendage. - Atrial septum: Doppler showed no atrial level shunt, in the baseline state. - Tricuspid valve: No evidence of vegetation. - Pulmonic valve: No evidence of vegetation.  09/12/2017 Echo Study Conclusions - Left ventricle: The cavity size was normal. Wall thickness was normal. Systolic function was normal. The estimated ejection fraction was in the range of 55% to 60%. Wall motion was normal; there were no regional wall motion abnormalities. Features are consistent with a pseudonormal left ventricular filling pattern, with concomitant abnormal  relaxation and increased filling pressure (grade 2 diastolic dysfunction). - Mitral valve: Poorly visualized. The findings are consistent with moderate stenosis. Mean gradient (D): 15 mm Hg. Valve area by pressure half-time: 1.62 cm^2. Valve area by continuity equation (using LVOT flow): 2.15 cm^2. Impressions: - Significantly elevated mitral valve gradient but the valve is not well visualized. Left atrial size is normal which argues against severe stenosis. Consider a TEE if clinically indicated.  Patient Profile     53 y.o. female with hx of nonobstructive CAD by cardiac CT 02/2018, PAF on Coumadin, chronic diastolic CHF, moderate mitral stenosis, HTN, HLD, former tobacco abuse and COPD, obesity, sleep apnea, and anxiety who is being seen today for the evaluation of progressive shortness of breath.  Assessment & Plan     Diastolic heart failure --Improved shortness of breath on nasal cannula  and likely multifactorial in the setting of known mitral stenosis with continued elevated HR and BP. Worsening renal function after R ureter stent /renal stone and UTI with multiple renally processed medications. Caution with fluids given diastolic heart failure. Updated echo as above with EF >06%, LV diastolic doppler parameters indeterminate. --Exam shows signs of increased volume today. IVF fluids in the room now discontinued. Patient reports abdominal distention & increased LEE despite documented weights decreased and documented fluids negative.  Wt 122kg  119kg. -423.6cc for the admission.  --Continue to hold diuresis given AKI. Renal function as below is improved but not yet back to baseline.   R lower extremity edema > Left lower extremity edema --Right lower extremity pedal and ankle edema greater than that of left lower extremity edema with associated pain, ongoing for several weeks.  Lower suspicion for DVT, given patient is on PTA warfarin with most recent INR 2.9 s/p transfusions  8/9 and 8/12. No palpable cord on exam and edema not consistent with that typically visualized with DVT and without associated erythema or warmth. Given comorbid conditions at this time, order placed for uric acid to rule out gout and in the setting of reported ankle and toe pain.  Further recommendations following these lab results.  Anemia, etiology unknown, s/p 2 units PRBC --Likely contributing to LEE and SOB. S/p transfusions with 1 unit each 8/9 and 8/12. Further workup of anemia needed as s/p 2 units PRBC this admission for Hgb below 8.0 and on PTA anticoagulation with warfarin due to history of valvular Afib. Recommendations re: warfarin pending anemia workup to rule out acute bleed and determine etiology of anemia. Ordered FOBT.  Mitral valve stenosis, moderate to severe --Likely contributing to SOB. Updated echo as above. Mitral valve not well visualized but seen with moderate thickening with stenosis moderate to severe and mean gradient of 15mmHg. Maintain low heart rate and blood pressure as above.  Avoid fluids. Continue BB, Cardizem for rate and BP control. Discussed with patient the need for continued evaluation / follow-up imaging of valve as an outpatient with patient udnerstanding.  Atrial fibrillation s/p DCCV Palpitations --Maintaining SR. Did note tachy-palpitations in the setting of worsening infection as in HPI.  On PTA warfarin for valvular Afib with no recurrence of Afib and with further recommendations re: anticoagulation pending needed workup of anemia to r/o acute bleed. Daily CBC/INR. Continue rate control as above with BB and Cardizem for tachy-palpitations - titrate as needed for optimal HR and BP control. BB increased yesterday with improved HR and BP today. Monitor electrolytes. 8/8 TSH 0.864.  Acute on chronic kidney disease --As above, worsening renal function in the setting of recent ureter stent/renal stone with subsequent UTI and multiple renally processed  medications with volume overload. Avoid nephrotoxic agents at this time.  Renally dose medications.  Avoid contrast. Continue to hold diuresis until renal function at baseline and stable. --Daily BMET. Baseline estimated at Cr 1.1-1.2.   HTN --Continue medical management. BP improved from yesterday with increased BB dose. Avoid fluids.   HLD --Continue statin therapy. 03/2018 LDL 109 and could consider up titration of statin as outpatient.   CAD --No chest pain. Initial EKG at admission without acute changes. Invasive ischemic workup not indicated at this time.     For questions or updates, please contact [place] Please consult [email or web address] for contact info under        Signed, Arvil Chaco, PA-C  08/31/2018, 10:00 AM

## 2018-08-31 NOTE — Progress Notes (Signed)
[facility]  ROUNDING NOTE   Subjective:  Patient seen at bedside. Creatinine down to 2.38. Uric acid appears to be high at 9.3. Complaining of right foot pain.   Objective:  Vital signs in last 24 hours:  Temp:  [97.4 F (36.3 C)-98.2 F (36.8 C)] 97.5 F (36.4 C) (08/13 0453) Pulse Rate:  [77-92] 92 (08/13 0844) Resp:  [16-20] 20 (08/13 0453) BP: (107-144)/(58-77) 144/75 (08/13 0844) SpO2:  [97 %-100 %] 100 % (08/13 0453) Weight:  [623 kg] 119 kg (08/13 0500)  Weight change: -0.953 kg Filed Weights   08/29/18 1153 08/30/18 0405 08/31/18 0500  Weight: 120 kg 122.2 kg 119 kg    Intake/Output: I/O last 3 completed shifts: In: 468 [P.O.:118; Blood:350] Out: 1000 [Urine:1000]   Intake/Output this shift:  No intake/output data recorded.  Physical Exam: General: No acute distress  Head: Normocephalic, atraumatic. Moist oral mucosal membranes  Eyes: Anicteric  Neck: Supple, trachea midline  Lungs:  Clear to auscultation, normal effort  Heart: S1S2 no rubs  Abdomen:  Soft, nontender, bowel sounds present  Extremities: Trace peripheral edema.  Neurologic: Awake, alert, following commands  Skin: No lesions       Basic Metabolic Panel: Recent Labs  Lab 08/27/18 0420 08/28/18 0554 08/29/18 0608 08/30/18 0615 08/31/18 0552  NA 135 133* 135 138 141  K 5.6* 4.3 4.5 4.2 4.1  CL 104 101 102 105 106  CO2 21* 21* 21* 22 24  GLUCOSE 108* 93 101* 99 102*  BUN 42* 58* 68* 65* 62*  CREATININE 3.24* 4.10* 3.79* 2.86* 2.38*  CALCIUM 8.1* 8.2* 8.9 9.0 8.7*  MG  --   --  2.6*  --   --     Liver Function Tests: No results for input(s): AST, ALT, ALKPHOS, BILITOT, PROT, ALBUMIN in the last 168 hours. No results for input(s): LIPASE, AMYLASE in the last 168 hours. No results for input(s): AMMONIA in the last 168 hours.  CBC: Recent Labs  Lab 08/27/18 0420 08/28/18 0554 08/29/18 0608 08/30/18 0615 08/31/18 0552  WBC 16.8* 15.3* 15.3* 12.4* 12.5*  HGB  6.9* 8.3* 8.7* 7.9* 9.0*  HCT 22.4* 25.9* 27.4* 25.3* 29.0*  MCV 85.5 83.5 84.8 86.1 86.3  PLT 239 220 227 227 257    Cardiac Enzymes: No results for input(s): CKTOTAL, CKMB, CKMBINDEX, TROPONINI in the last 168 hours.  BNP: Invalid input(s): POCBNP  CBG: Recent Labs  Lab 08/28/18 1634  GLUCAP 98    Microbiology: Results for orders placed or performed during the hospital encounter of 08/26/18  Blood culture (routine x 2)     Status: None   Collection Time: 08/26/18  3:05 PM   Specimen: BLOOD  Result Value Ref Range Status   Specimen Description BLOOD RIGHT ANTECUBITAL  Final   Special Requests   Final    BOTTLES DRAWN AEROBIC AND ANAEROBIC Blood Culture adequate volume   Culture   Final    NO GROWTH 5 DAYS Performed at [facility], [address], [place], [place] [address]    Report Status 08/31/2018 FINAL  Final  Blood culture (routine x 2)     Status: None   Collection Time: 08/26/18  3:05 PM   Specimen: BLOOD  Result Value Ref Range Status   Specimen Description BLOOD BLOOD LEFT HAND  Final   Special Requests   Final    BOTTLES DRAWN AEROBIC AND ANAEROBIC Blood Culture adequate volume   Culture   Final    NO GROWTH  5 DAYS Performed at [facility], [address] [place], [place] [address]    Report Status 08/31/2018 FINAL  Final  Urine culture     Status: Abnormal   Collection Time: 08/26/18  4:32 PM   Specimen: Urine, Clean Catch  Result Value Ref Range Status   Specimen Description   Final    URINE, CLEAN CATCH Performed at [facility], [address] [place], [place] [address]    Special Requests   Final    Normal Performed at [facility], [place], [place] [address]    Culture >=100,000 COLONIES/mL ESCHERICHIA COLI (A)  Final   Report Status 08/29/2018 FINAL  Final   Organism ID, Bacteria ESCHERICHIA COLI (A)  Final      Susceptibility   Escherichia coli - MIC*    AMPICILLIN <=2  SENSITIVE Sensitive     CEFAZOLIN <=4 SENSITIVE Sensitive     CEFTRIAXONE <=1 SENSITIVE Sensitive     CIPROFLOXACIN >=4 RESISTANT Resistant     GENTAMICIN <=1 SENSITIVE Sensitive     IMIPENEM <=0.25 SENSITIVE Sensitive     NITROFURANTOIN <=16 SENSITIVE Sensitive     TRIMETH/SULFA >=320 RESISTANT Resistant     AMPICILLIN/SULBACTAM <=2 SENSITIVE Sensitive     PIP/TAZO <=4 SENSITIVE Sensitive     Extended ESBL NEGATIVE Sensitive     * >=100,000 COLONIES/mL ESCHERICHIA COLI  SARS Coronavirus 2 [facility] order, Performed in [place] hospital lab) Nasopharyngeal Nasopharyngeal Swab     Status: None   Collection Time: 08/26/18  4:34 PM   Specimen: Nasopharyngeal Swab  Result Value Ref Range Status   SARS Coronavirus 2 NEGATIVE NEGATIVE Final    Comment: (NOTE) If result is NEGATIVE SARS-CoV-2 target nucleic acids are NOT DETECTED. The SARS-CoV-2 RNA is generally detectable in upper and lower  respiratory specimens during the acute phase of infection. The lowest  concentration of SARS-CoV-2 viral copies this assay can detect is 250  copies / mL. A negative result does not preclude SARS-CoV-2 infection  and should not be used as the sole basis for treatment or other  patient management decisions.  A negative result may occur with  improper specimen collection / handling, submission of specimen other  than nasopharyngeal swab, presence of viral mutation(s) within the  areas targeted by this assay, and inadequate number of viral copies  (<250 copies / mL). A negative result must be combined with clinical  observations, patient history, and epidemiological information. If result is POSITIVE SARS-CoV-2 target nucleic acids are DETECTED. The SARS-CoV-2 RNA is generally detectable in upper and lower  respiratory specimens dur ing the acute phase of infection.  Positive  results are indicative of active infection with SARS-CoV-2.  Clinical  correlation with patient history and other  diagnostic information is  necessary to determine patient infection status.  Positive results do  not rule out bacterial infection or co-infection with other viruses. If result is PRESUMPTIVE POSTIVE SARS-CoV-2 nucleic acids MAY BE PRESENT.   A presumptive positive result was obtained on the submitted specimen  and confirmed on repeat testing.  While 2019 novel coronavirus  (SARS-CoV-2) nucleic acids may be present in the submitted sample  additional confirmatory testing may be necessary for epidemiological  and / or clinical management purposes  to differentiate between  SARS-CoV-2 and other Sarbecovirus currently known to infect humans.  If clinically indicated additional testing with an alternate test  methodology [phone]) is advised. The SARS-CoV-2 RNA is generally  detectable in upper  and lower respiratory sp ecimens during the acute  phase of infection. The expected result is Negative. Fact Sheet for Patients:  StrictlyIdeas.no Fact Sheet for Healthcare Providers: BankingDealers.co.za This test is not yet approved or cleared by the [place] FDA and has been authorized for detection and/or diagnosis of SARS-CoV-2 by FDA under an Emergency Use Authorization (EUA).  This EUA will remain in effect (meaning this test can be used) for the duration of the COVID-19 declaration under Section 564(b)(1) of the Act, 21 U.S.C. section 360bbb-3(b)(1), unless the authorization is terminated or revoked sooner. Performed at [facility], [address] [place], [place] [address]     Coagulation Studies: Recent Labs    08/29/18 7414 08/30/18 0615 08/31/18 0552  LABPROT 38.7* 36.7* 30.0*  INR 4.0* 3.8* 2.9*    Urinalysis: No results for input(s): COLORURINE, LABSPEC, PHURINE, GLUCOSEU, HGBUR, BILIRUBINUR, KETONESUR, PROTEINUR, UROBILINOGEN, NITRITE, LEUKOCYTESUR in the last 72 hours.  Invalid input(s): APPERANCEUR     Imaging: No results found.   Medications:   .  ceFAZolin (ANCEF) IV     . atorvastatin  40 mg Oral q1800  . diltiazem  240 mg Oral Daily  . docusate sodium  100 mg Oral BID  . metoprolol succinate  100 mg Oral Daily  . montelukast  10 mg Oral QHS  . pantoprazole  40 mg Oral Daily  . patiromer  16.8 g Oral Daily  . tamsulosin  0.4 mg Oral QPM  . Warfarin - Pharmacist Dosing Inpatient   Does not apply q1800   acetaminophen **OR** acetaminophen, ALPRAZolam, bisacodyl, HYDROcodone-acetaminophen, ipratropium-albuterol, ondansetron **OR** ondansetron (ZOFRAN) IV, senna  Assessment/ Plan:  53 y.o. female  with with medical problems of CHF,Interstitial lung disease, HTN, A fib , who was admitted to [facility] on 08/26/2018 for evaluation of abdominal pain.  Diagnosed with sepsis due to acute pyelonephritis and ARF   1.  Acute kidney injury likely secondary to ongoing infection/pyelonephritis/CKD stage III. Baseline creatinine 1.25/GFR 57 from August 15, 2018 An 8 mm  nonobstructing calculus is seen in the right kidney on the most recent CT -Kidney function continues to improve.  Creatinine down to 2.38.  Monitor renal parameters daily..  2.  Hyperkalemia.  Potassium remains normal at 4.1.  Reduce potassium her to 8.4 g daily.  3.  Hypertension.  Continue diltiazem and metoprolol.  4.  Gout.  Patient complaining of right foot pain.  Uric acid high at 9.3.  Start the patient on prednisone 40 mg daily x3 days.   LOS: 5 Leslie [name] 8/13/202011:48 AM

## 2018-09-01 ENCOUNTER — Ambulatory Visit: Payer: BC Managed Care – PPO | Admitting: Nurse Practitioner

## 2018-09-01 DIAGNOSIS — I342 Nonrheumatic mitral (valve) stenosis: Secondary | ICD-10-CM

## 2018-09-01 LAB — BASIC METABOLIC PANEL
Anion gap: 7 (ref 5–15)
BUN: 49 mg/dL — ABNORMAL HIGH (ref 6–20)
CO2: 25 mmol/L (ref 22–32)
Calcium: 8.6 mg/dL — ABNORMAL LOW (ref 8.9–10.3)
Chloride: 109 mmol/L (ref 98–111)
Creatinine, Ser: 1.77 mg/dL — ABNORMAL HIGH (ref 0.44–1.00)
GFR calc Af Amer: 38 mL/min — ABNORMAL LOW (ref >60)
GFR calc non Af Amer: 32 mL/min — ABNORMAL LOW (ref >60)
Glucose, Bld: 113 mg/dL — ABNORMAL HIGH (ref 70–99)
Potassium: 4.6 mmol/L (ref 3.5–5.1)
Sodium: 141 mmol/L (ref 135–145)

## 2018-09-01 LAB — PROTIME-INR
INR: 2.8 — ABNORMAL HIGH (ref 0.8–1.2)
Prothrombin Time: 28.9 seconds — ABNORMAL HIGH (ref 11.4–15.2)

## 2018-09-01 LAB — MAGNESIUM: Magnesium: 2.7 mg/dL — ABNORMAL HIGH (ref 1.7–2.4)

## 2018-09-01 MED ORDER — CEPHALEXIN 500 MG PO CAPS
500.0000 mg | ORAL_CAPSULE | Freq: Two times a day (BID) | ORAL | Status: DC
  Administered 2018-09-01: 12:00:00 500 mg via ORAL
  Filled 2018-09-01: qty 1

## 2018-09-01 MED ORDER — FUROSEMIDE 40 MG PO TABS: 40.0000 mg | ORAL_TABLET | Freq: Every day | ORAL | 1 refills | Status: DC

## 2018-09-01 MED ORDER — CEPHALEXIN 500 MG PO CAPS: 500.0000 mg | ORAL_CAPSULE | Freq: Two times a day (BID) | ORAL | 0 refills | Status: AC

## 2018-09-01 MED ORDER — FUROSEMIDE 40 MG PO TABS
40.0000 mg | ORAL_TABLET | Freq: Every day | ORAL | Status: DC
  Administered 2018-09-01: 40 mg via ORAL
  Filled 2018-09-01: qty 1

## 2018-09-01 MED ORDER — PREDNISONE 20 MG PO TABS: 40.0000 mg | ORAL_TABLET | Freq: Every day | ORAL | 0 refills | Status: AC

## 2018-09-01 NOTE — Discharge Summary (Signed)
[employer] [facility] [place] NAME: Leslie [name]    MR#:  [id number]  DATE OF BIRTH:  01/28/1965  DATE OF ADMISSION:  08/26/2018   ADMITTING PHYSICIAN: Gladstone Lighter, MD  DATE OF DISCHARGE:  09/01/2018  PRIMARY CARE PHYSICIAN: Ronnell Freshwater, NP   ADMISSION DIAGNOSIS:  Pyelonephritis [N12] AKI (acute kidney injury) ([place]) [N17.9] DISCHARGE DIAGNOSIS:  Active Problems:   Sepsis ([place])   Acute on chronic heart failure with preserved ejection fraction (HFpEF) (HCC)   AKI (acute kidney injury) ([place])  SECONDARY DIAGNOSIS:   Past Medical History:  Diagnosis Date  . (HFpEF) heart failure with preserved ejection fraction ([place])    a. 08/2017 Echo: EF 55-60%.  Grade 2 diastolic dysfunction.  Moderate mitral stenosis.  . Allergy   . Anemia   . Anxiety   . BRCA negative 03/22/2013  . Bronchitis 02/19/2016   ON LEVAQUIN PO  . Chest tightness   . CHF (congestive heart failure) ([place])   . Cigarette smoker 09/11/2017   8-10 day  . GERD (gastroesophageal reflux disease)   . History of kidney stones   . Hyperlipidemia   . Hypertension   . Interstitial lung disease ([place])    a. CT 2013 b. 02/2018 CXR noted recurrent intersistial changes ILD vs chronic bronchitis  . Moderate mitral stenosis    a.  08/2017 TEE: EF 60 to 65%.  Moderate mitral stenosis.  Mean gradient 14 mmHg.  Valve area 2.59 cm by planimetry, 2.72 cm by pressure half-time.  . Palpitations   . Persistent atrial fibrillation    a. 08/2017 s/p TEE/DCCV; b. CHA2DS2VASc = 2-->warfarin.   HOSPITAL COURSE:  ShawntoFoustis a53 y.o.femalewith a known history of persistent atrial fibrillation status post TEE cardioversion on Coumadin, hypertension, history of interstitial lung disease, diastolic heart failure, GERD, obesity and kidney stones presents to hospital secondary to worsening abdominal pain, nausea, shortness of breath and dysuria.  1. Sepsis-likely secondary to acute  pyelonephritis on the right side -Also had a recent right ureteral stent 1 week ago which has been removed now. Follow-up blood cultures. Lactic acid is within normal limits. She has been treated with meropenem-given complicated UTI and recent use of Bactrim as outpatient. Urine culture >=100,000 COLONIES/mL ESCHERICHIA COLI. Changed to Ancef IV.  Leukocytosis is improving.  Change to p.o. Keflex.   2. Acute renal failure on CKD stage III-Baseline creatinine seems to be around 1.2.  Worsening. -Also has been taking Toradol for her back pain as needed -Avoid nephrotoxins -No recent IV contrast -CT of the abdomen showing enlargement of right kidney with some perinephric edema, no obstructing calculus noted.  Repeat renal ultrasound report no hydronephrosis. Hold Lasix. The patient has been treated with gentle rehydration with fluids. Hold IV fluid due to hypoxia. Renal function is improving.  Hyponatremia.  Improved.  3.   Acute respiratory failure with hypoxia due to acute on chronic diastolic CHF.  -Chest x-ray showing bibasilar increased interstitial markings could be pulmonary edema versus atypical pneumonitis. -Chest x-ray from February also showing coarse interstitial markings. Possible chronic interstitial lung disease. Prior history of smoking -Check BNP. 1 dose of IV Lasix being given -Known history of diastolic heart failure with last echo showing normal ejection fraction. Also complains of 1+ pedal edema in the last week. -Plain CT chest without contrast: Overall findings concerning for developing pulmonary edema. An atypical infectious process is not entirely excluded and should be correlated clinically. Hold Lasix due to worsening renal  failure.  Echocardiogram: Moderate to severe mitral valve stenosis.  Ejection fraction more than 65%. Continue oxygen via nasal cannula.    Restart Lasix 40 mg daily per Dr. Rockey Situ.  Moderate to severe mitral valve stenosis. For  cardiology consult, continue beta-blocker and Cardizem.  Further evaluation as outpatient.  4. Persistent atrial fibrillation-currently remains in normal sinus rhythm. History of TEE cardioversion in the past. -Continue oral Cardizem and metoprolol per Dr. Saunders Revel. -On warfarin for anticoagulation. Pharmacy to adjust the dose Hold Coumadin due to possible GI bleeding (postive FOTB) Waiting for GI consult.  May follow-up GI as outpatient.  Hyperkalemia.  Possible due to acute renal failure.  Started [place] for Dr. Candiss Norse.  Improved. The patient has no signs of bleeding. Coumadin pharmacy to dose.  Supratherapeutic INR.  INR down to 2.8.  Coumadin pharmacy to dose.  Anemia of chronic disease.  Hemoglobin decreased to 6.9.  S/P 1 unit PRBC transfusion.  Follow hemoglobin was 7.9. Recommend transfusion below 8 per cardiologist. S/p one more unit PRBC transfusion and follow-up hemoglobin 9.0.  Generalized weakness.  PT and ambulate patient.  The patient needs home health and PT. I discussed with Dr. Rockey Situ and Dr. Holley Raring. DISCHARGE CONDITIONS:  Stable, discharge to home with home health PT today. CONSULTS OBTAINED:  Treatment Team:  Murlean Iba, MD End, Harrell Gave, MD Lucilla Lame, MD DRUG ALLERGIES:   Allergies  Allergen Reactions  . Morphine Hives and Rash  . Oxycodone Hcl Hives and Itching  . Dilaudid [Hydromorphone Hcl] Itching   DISCHARGE MEDICATIONS:   Allergies as of 09/01/2018      Reactions   Morphine Hives, Rash   Oxycodone Hcl Hives, Itching   Dilaudid [hydromorphone Hcl] Itching      Medication List    STOP taking these medications   celecoxib 200 MG capsule Commonly known as: CELEBREX   chlorpheniramine-HYDROcodone 10-8 MG/5ML Suer Commonly known as: Tussionex Pennkinetic ER   naproxen sodium 220 MG tablet Commonly known as: ALEVE   sulfamethoxazole-trimethoprim 800-160 MG tablet Commonly known as: BACTRIM DS     TAKE these medications    albuterol 108 (90 Base) MCG/ACT inhaler Commonly known as: VENTOLIN HFA Inhale 2 puffs into the lungs every 4 (four) hours as needed for wheezing or shortness of breath.   ALPRAZolam 0.5 MG tablet Commonly known as: XANAX Take 1 tablet (0.5 mg total) by mouth at bedtime as needed for anxiety.   atorvastatin 40 MG tablet Commonly known as: LIPITOR Take 1 tablet (40 mg total) by mouth daily at 6 PM.   cephALEXin 500 MG capsule Commonly known as: KEFLEX Take 1 capsule (500 mg total) by mouth every 12 (twelve) hours for 4 days.   diltiazem 240 MG 24 hr capsule Commonly known as: CARDIZEM CD TAKE 1 CAPSULE DAILY   estradiol 0.5 MG tablet Commonly known as: ESTRACE Take 1 tablet (0.5 mg total) by mouth daily.   furosemide 40 MG tablet Commonly known as: LASIX Take 1 tablet (40 mg total) by mouth daily. Start taking on: September 02, 2018 What changed:   medication strength  how much to take  how to take this  when to take this  additional instructions   metoprolol succinate 25 MG 24 hr tablet Commonly known as: TOPROL-XL Take 1 tablet (25 mg total) by mouth daily. Take with or immediately following a meal.   montelukast 10 MG tablet Commonly known as: SINGULAIR Take 1 tablet (10 mg total) by mouth at bedtime.   omeprazole 40 MG  capsule Commonly known as: PRILOSEC Take 1 capsule (40 mg total) by mouth daily.   potassium chloride 10 MEQ tablet Commonly known as: K-DUR Take 2 tablets (20 meq) by mouth once daily What changed:   how much to take  how to take this  when to take this  additional instructions   predniSONE 20 MG tablet Commonly known as: DELTASONE Take 2 tablets (40 mg total) by mouth daily with breakfast for 3 days. Start taking on: September 02, 2018   tamsulosin 0.4 MG Caps capsule Commonly known as: FLOMAX Take 0.4 mg by mouth every evening.   warfarin 2.5 MG tablet Commonly known as: COUMADIN Take as directed. If you are unsure how to  take this medication, talk to your nurse or doctor. Original instructions: TAKE 1/2 TO 1 TABLET DAILY AS DIRECTED BY COUMADIN CLINIC. What changed:   how much to take  how to take this  when to take this  additional instructions        DISCHARGE INSTRUCTIONS:  See AVS. If you experience worsening of your admission symptoms, develop shortness of breath, life threatening emergency, suicidal or homicidal thoughts you must seek medical attention immediately by calling 911 or calling your MD immediately  if symptoms less severe.  You Must read complete instructions/literature along with all the possible adverse reactions/side effects for all the Medicines you take and that have been prescribed to you. Take any new Medicines after you have completely understood and accpet all the possible adverse reactions/side effects.   Please note  You were cared for by a hospitalist during your hospital stay. If you have any questions about your discharge medications or the care you received while you were in the hospital after you are discharged, you can call the unit and asked to speak with the hospitalist on call if the hospitalist that took care of you is not available. Once you are discharged, your primary care physician will handle any further medical issues. Please note that NO REFILLS for any discharge medications will be authorized once you are discharged, as it is imperative that you return to your primary care physician (or establish a relationship with a primary care physician if you do not have one) for your aftercare needs so that they can reassess your need for medications and monitor your lab values.    On the day of Discharge:  VITAL SIGNS:  Blood pressure (!) 143/78, pulse 90, temperature 98.1 F (36.7 C), temperature source Oral, resp. rate 20, height '5\' 4"'  (1.626 m), weight 119 kg, SpO2 100 %. PHYSICAL EXAMINATION:  GENERAL:  53 y.o.-year-old patient lying in the bed with no acute  distress.  EYES: Pupils equal, round, reactive to light and accommodation. No scleral icterus. Extraocular muscles intact.  HEENT: Head atraumatic, normocephalic. Oropharynx and nasopharynx clear.  NECK:  Supple, no jugular venous distention. No thyroid enlargement, no tenderness.  LUNGS: Normal breath sounds bilaterally, no wheezing, rales,rhonchi or crepitation. No use of accessory muscles of respiration.  CARDIOVASCULAR: S1, S2 normal. No murmurs, rubs, or gallops.  ABDOMEN: Soft, non-tender, non-distended. Bowel sounds present. No organomegaly or mass.  EXTREMITIES: No pedal edema, cyanosis, or clubbing.  NEUROLOGIC: Cranial nerves II through XII are intact. Muscle strength 4/5 in all extremities. Sensation intact. Gait not checked.  PSYCHIATRIC: The patient is alert and oriented x 3.  SKIN: No obvious rash, lesion, or ulcer.  DATA REVIEW:   CBC Recent Labs  Lab 08/31/18 0552  WBC 12.5*  HGB 9.0*  HCT 29.0*  PLT 257    Chemistries  Recent Labs  Lab 09/01/18 0631  NA 141  K 4.6  CL 109  CO2 25  GLUCOSE 113*  BUN 49*  CREATININE 1.77*  CALCIUM 8.6*  MG 2.7*     Microbiology Results  Results for orders placed or performed during the hospital encounter of 08/26/18  Blood culture (routine x 2)     Status: None   Collection Time: 08/26/18  3:05 PM   Specimen: BLOOD  Result Value Ref Range Status   Specimen Description BLOOD RIGHT ANTECUBITAL  Final   Special Requests   Final    BOTTLES DRAWN AEROBIC AND ANAEROBIC Blood Culture adequate volume   Culture   Final    NO GROWTH 5 DAYS Performed at [facility], [address] [place], [place] [address]    Report Status 08/31/2018 FINAL  Final  Blood culture (routine x 2)     Status: None   Collection Time: 08/26/18  3:05 PM   Specimen: BLOOD  Result Value Ref Range Status   Specimen Description BLOOD BLOOD LEFT HAND  Final   Special Requests   Final    BOTTLES DRAWN AEROBIC AND ANAEROBIC Blood Culture  adequate volume   Culture   Final    NO GROWTH 5 DAYS Performed at [facility], [address] [place], [place] [address]    Report Status 08/31/2018 FINAL  Final  Urine culture     Status: Abnormal   Collection Time: 08/26/18  4:32 PM   Specimen: Urine, Clean Catch  Result Value Ref Range Status   Specimen Description   Final    URINE, CLEAN CATCH Performed at [facility], [address] [place], [place] [address]    Special Requests   Final    Normal Performed at [facility], [address] [place], [place] [address]    Culture >=100,000 COLONIES/mL ESCHERICHIA COLI (A)  Final   Report Status 08/29/2018 FINAL  Final   Organism ID, Bacteria ESCHERICHIA COLI (A)  Final      Susceptibility   Escherichia coli - MIC*    AMPICILLIN <=2 SENSITIVE Sensitive     CEFAZOLIN <=4 SENSITIVE Sensitive     CEFTRIAXONE <=1 SENSITIVE Sensitive     CIPROFLOXACIN >=4 RESISTANT Resistant     GENTAMICIN <=1 SENSITIVE Sensitive     IMIPENEM <=0.25 SENSITIVE Sensitive     NITROFURANTOIN <=16 SENSITIVE Sensitive     TRIMETH/SULFA >=320 RESISTANT Resistant     AMPICILLIN/SULBACTAM <=2 SENSITIVE Sensitive     PIP/TAZO <=4 SENSITIVE Sensitive     Extended ESBL NEGATIVE Sensitive     * >=100,000 COLONIES/mL ESCHERICHIA COLI  SARS Coronavirus 2 [facility] order, Performed in [place] hospital lab) Nasopharyngeal Nasopharyngeal Swab     Status: None   Collection Time: 08/26/18  4:34 PM   Specimen: Nasopharyngeal Swab  Result Value Ref Range Status   SARS Coronavirus 2 NEGATIVE NEGATIVE Final    Comment: (NOTE) If result is NEGATIVE SARS-CoV-2 target nucleic acids are NOT DETECTED. The SARS-CoV-2 RNA is generally detectable in upper and lower  respiratory specimens during the acute phase of infection. The lowest  concentration of SARS-CoV-2 viral copies this assay can detect is 250  copies / mL. A negative result does not preclude SARS-CoV-2 infection  and  should not be used as the sole basis for treatment or other  patient management decisions.  A negative result may occur with  improper  specimen collection / handling, submission of specimen other  than nasopharyngeal swab, presence of viral mutation(s) within the  areas targeted by this assay, and inadequate number of viral copies  (<250 copies / mL). A negative result must be combined with clinical  observations, patient history, and epidemiological information. If result is POSITIVE SARS-CoV-2 target nucleic acids are DETECTED. The SARS-CoV-2 RNA is generally detectable in upper and lower  respiratory specimens dur ing the acute phase of infection.  Positive  results are indicative of active infection with SARS-CoV-2.  Clinical  correlation with patient history and other diagnostic information is  necessary to determine patient infection status.  Positive results do  not rule out bacterial infection or co-infection with other viruses. If result is PRESUMPTIVE POSTIVE SARS-CoV-2 nucleic acids MAY BE PRESENT.   A presumptive positive result was obtained on the submitted specimen  and confirmed on repeat testing.  While 2019 novel coronavirus  (SARS-CoV-2) nucleic acids may be present in the submitted sample  additional confirmatory testing may be necessary for epidemiological  and / or clinical management purposes  to differentiate between  SARS-CoV-2 and other Sarbecovirus currently known to infect humans.  If clinically indicated additional testing with an alternate test  methodology [phone]) is advised. The SARS-CoV-2 RNA is generally  detectable in upper and lower respiratory sp ecimens during the acute  phase of infection. The expected result is Negative. Fact Sheet for Patients:  StrictlyIdeas.no Fact Sheet for Healthcare Providers: BankingDealers.co.za This test is not yet approved or cleared by the [place] FDA and has been  authorized for detection and/or diagnosis of SARS-CoV-2 by FDA under an Emergency Use Authorization (EUA).  This EUA will remain in effect (meaning this test can be used) for the duration of the COVID-19 declaration under Section 564(b)(1) of the Act, 21 U.S.C. section 360bbb-3(b)(1), unless the authorization is terminated or revoked sooner. Performed at [facility], [address], [place], [place] [address]     RADIOLOGY:  No results found.   Management plans discussed with the patient, family and they are in agreement.  CODE STATUS: Full Code   TOTAL TIME TAKING CARE OF THIS PATIENT: 36 minutes.    Demetrios Loll M.D on 09/01/2018 at 3:35 PM  Between 7am to 6pm - Pager - [phone]  After 6pm go to [email or web address] - Proofreader  [facility]  Office  [phone]  CC: Primary care physician; Ronnell Freshwater, NP   Note: This dictation was prepared with Dragon dictation along with smaller phrase technology. Any transcriptional errors that result from this process are unintentional.

## 2018-09-01 NOTE — Progress Notes (Signed)
Leslie [name] to be D/C'd home per MD order.  Discussed prescriptions and follow up appointments with the patient. Prescriptions given to patient, medication list explained in detail. Pt verbalized understanding.  Allergies as of 09/01/2018      Reactions   Morphine Hives, Rash   Oxycodone Hcl Hives, Itching   Dilaudid [hydromorphone Hcl] Itching      Medication List    STOP taking these medications   celecoxib 200 MG capsule Commonly known as: CELEBREX   chlorpheniramine-HYDROcodone 10-8 MG/5ML Suer Commonly known as: Tussionex Pennkinetic ER   naproxen sodium 220 MG tablet Commonly known as: ALEVE   sulfamethoxazole-trimethoprim 800-160 MG tablet Commonly known as: BACTRIM DS     TAKE these medications   albuterol 108 (90 Base) MCG/ACT inhaler Commonly known as: VENTOLIN HFA Inhale 2 puffs into the lungs every 4 (four) hours as needed for wheezing or shortness of breath.   ALPRAZolam 0.5 MG tablet Commonly known as: XANAX Take 1 tablet (0.5 mg total) by mouth at bedtime as needed for anxiety.   atorvastatin 40 MG tablet Commonly known as: LIPITOR Take 1 tablet (40 mg total) by mouth daily at 6 PM.   cephALEXin 500 MG capsule Commonly known as: KEFLEX Take 1 capsule (500 mg total) by mouth every 12 (twelve) hours for 4 days.   diltiazem 240 MG 24 hr capsule Commonly known as: CARDIZEM CD TAKE 1 CAPSULE DAILY   estradiol 0.5 MG tablet Commonly known as: ESTRACE Take 1 tablet (0.5 mg total) by mouth daily.   furosemide 40 MG tablet Commonly known as: LASIX Take 1 tablet (40 mg total) by mouth daily. Start taking on: September 02, 2018 What changed:   medication strength  how much to take  how to take this  when to take this  additional instructions   metoprolol succinate 25 MG 24 hr tablet Commonly known as: TOPROL-XL Take 1 tablet (25 mg total) by mouth daily. Take with or immediately following a meal.   montelukast 10 MG tablet Commonly known as:  SINGULAIR Take 1 tablet (10 mg total) by mouth at bedtime.   omeprazole 40 MG capsule Commonly known as: PRILOSEC Take 1 capsule (40 mg total) by mouth daily.   potassium chloride 10 MEQ tablet Commonly known as: K-DUR Take 2 tablets (20 meq) by mouth once daily What changed:   how much to take  how to take this  when to take this  additional instructions   predniSONE 20 MG tablet Commonly known as: DELTASONE Take 2 tablets (40 mg total) by mouth daily with breakfast for 3 days. Start taking on: September 02, 2018   tamsulosin 0.4 MG Caps capsule Commonly known as: FLOMAX Take 0.4 mg by mouth every evening.   warfarin 2.5 MG tablet Commonly known as: COUMADIN Take as directed. If you are unsure how to take this medication, talk to your nurse or doctor. Original instructions: TAKE 1/2 TO 1 TABLET DAILY AS DIRECTED BY COUMADIN CLINIC. What changed:   how much to take  how to take this  when to take this  additional instructions       Vitals:   08/31/18 2124 09/01/18 0501  BP: (!) 151/72 (!) 143/78  Pulse: 81 90  Resp: 20 20  Temp: 98.3 F (36.8 C) 98.1 F (36.7 C)  SpO2: 100% 100%    Skin clean, dry and intact without evidence of skin break down, no evidence of skin tears noted. IV catheter discontinued intact. Site without signs and  symptoms of complications. Dressing and pressure applied. Pt denies pain at this time. No complaints noted.  An After Visit Summary was printed and given to the patient. Patient escorted via [place], and D/C home via private auto.  Leslie [name] A Leslie [name]

## 2018-09-01 NOTE — TOC Transition Note (Addendum)
Transition of Care [facility]) - CM/SW Discharge Note   Patient Details  Name: Lakeena Downie MRN: [id number] Date of Birth: [birth year]  [facility] CM/SW Contact:  Candie Chroman, LCSW Phone Number: 09/01/2018, 12:00 PM   Clinical Narrative: CSW called [employer] [facility] to let them know patient is discharging today instead of Sunday. They have submitted a request to escalate authorization. They will contact patient once a home health agency has been set up. Patient is aware and agreeable. RN is aware. No further concerns. CSW signing off.    1:34 pm: Received call from [facility]. Patient has been set up with [facility] and start date will be 8/18. Patient is aware and agreeable. CSW will fax discharge summary when available. CSW signing off.  Final next level of care: Home w Home Health Services Barriers to Discharge: Barriers Resolved   Patient Goals and CMS Choice        Discharge Placement                    Patient and family notified of of transfer: 09/01/18  Discharge Plan and Services     Post Acute Care Choice: Home Health          DME Arranged: N/A         HH Arranged: PT, OT          Social Determinants of Health (SDOH) Interventions     Readmission Risk Interventions No flowsheet data found.

## 2018-09-01 NOTE — Discharge Instructions (Signed)
HHPT °

## 2018-09-01 NOTE — Progress Notes (Signed)
Leslie [name] for Warfarin  Indication: atrial fibrillation  Patient Measurements: Height: '5\' 4"'  (162.6 cm) Weight: 262 lb 6.4 oz (119 kg) IBW/kg (Calculated) : 54.7  Vital Signs: Temp: 98.1 F (36.7 C) (08/14 0501) Temp Source: Oral (08/14 0501) BP: 143/78 (08/14 0501) Pulse Rate: 90 (08/14 0501)  Labs: Recent Labs    08/30/18 0615 08/31/18 0552 09/01/18 0631  HGB 7.9* 9.0*  --   HCT 25.3* 29.0*  --   PLT 227 257  --   LABPROT 36.7* 30.0* 28.9*  INR 3.8* 2.9* 2.8*  CREATININE 2.86* 2.38* 1.77*    Estimated Creatinine Clearance: 47.2 mL/min (A) (by C-G formula based on SCr of 1.77 mg/dL (H)).   Medical History: Past Medical History:  Diagnosis Date  . (HFpEF) heart failure with preserved ejection fraction ([place])    a. 08/2017 Echo: EF 55-60%.  Grade 2 diastolic dysfunction.  Moderate mitral stenosis.  . Allergy   . Anemia   . Anxiety   . BRCA negative 03/22/2013  . Bronchitis 02/19/2016   ON LEVAQUIN PO  . Chest tightness   . CHF (congestive heart failure) ([place])   . Cigarette smoker 09/11/2017   8-10 day  . GERD (gastroesophageal reflux disease)   . History of kidney stones   . Hyperlipidemia   . Hypertension   . Interstitial lung disease ([place])    a. CT 2013 b. 02/2018 CXR noted recurrent intersistial changes ILD vs chronic bronchitis  . Moderate mitral stenosis    a.  08/2017 TEE: EF 60 to 65%.  Moderate mitral stenosis.  Mean gradient 14 mmHg.  Valve area 2.59 cm by planimetry, 2.72 cm by pressure half-time.  . Palpitations   . Persistent atrial fibrillation    a. 08/2017 s/p TEE/DCCV; b. CHA2DS2VASc = 2-->warfarin.    Medications:  Medications Prior to Admission  Medication Sig Dispense Refill Last Dose  . albuterol (VENTOLIN HFA) 108 (90 Base) MCG/ACT inhaler Inhale 2 puffs into the lungs every 4 (four) hours as needed for wheezing or shortness of breath. 3 Inhaler 0 Unknown at PRN  . ALPRAZolam (XANAX) 0.5 MG tablet  Take 1 tablet (0.5 mg total) by mouth at bedtime as needed for anxiety. 30 tablet 2 Unknown at PRN  . atorvastatin (LIPITOR) 40 MG tablet Take 1 tablet (40 mg total) by mouth daily at 6 PM. 90 tablet 3 08/25/2018 at 1800  . celecoxib (CELEBREX) 200 MG capsule Take 200 mg by mouth at bedtime as needed for mild pain.    Unknown at PRN  . estradiol (ESTRACE) 0.5 MG tablet Take 1 tablet (0.5 mg total) by mouth daily. 90 tablet 1 08/26/2018 at 0730  . metoprolol succinate (TOPROL-XL) 25 MG 24 hr tablet Take 1 tablet (25 mg total) by mouth daily. Take with or immediately following a meal. 90 tablet 0 08/26/2018 at 0730  . montelukast (SINGULAIR) 10 MG tablet Take 1 tablet (10 mg total) by mouth at bedtime. 90 tablet 2 08/25/2018 at 2100  . naproxen sodium (ALEVE) 220 MG tablet Take 440 mg by mouth daily as needed (pain).   Unknown at PRN  . omeprazole (PRILOSEC) 40 MG capsule Take 1 capsule (40 mg total) by mouth daily. 90 capsule 1 08/26/2018 at 0730  . potassium chloride (K-DUR) 10 MEQ tablet Take 2 tablets (20 meq) by mouth once daily (Patient taking differently: Take 20 mEq by mouth daily. ) 180 tablet 2 08/25/2018 at 0700  . sulfamethoxazole-trimethoprim (BACTRIM DS) 800-160 MG  tablet Take 1 tablet by mouth daily. 4 tablet 0 08/25/2018 at 1800  . tamsulosin (FLOMAX) 0.4 MG CAPS capsule Take 0.4 mg by mouth every evening.    08/25/2018 at 1800  . warfarin (COUMADIN) 2.5 MG tablet TAKE 1/2 TO 1 TABLET DAILY AS DIRECTED BY COUMADIN CLINIC. (Patient taking differently: Take 1.25-2.5 mg by mouth See admin instructions. Take 2.5 mg at night on Mon, Wed, and Fri.  Take 1.25 mg at night on Sun, Tue, Thur, and Sat) 90 tablet 1 08/25/2018 at 2100  . chlorpheniramine-HYDROcodone (TUSSIONEX PENNKINETIC ER) 10-8 MG/5ML SUER Take 5 mLs by mouth every 12 (twelve) hours as needed for cough. (Patient not taking: Reported on 08/26/2018) 115 mL 0 Not Taking at Unknown time    Assessment: Pharmacy consulted to dose warfarin in this 53 year  old female with Afib.  Pt was on Warfarin 2.5 mg PO MWF and warfarin 1.25 mg PO T-Th-Sat-Sun.   DDIs: cefazolin and Veltassa  It appears she was on Bactrim PTA s/p R ureteral stent ~1 week ago.    No CBC today. AM labs yesterday indicated H&H stable at 9. Last transfusion 8/12. Fecal occult blood test resulted positive yesterday at 1823 after warfarin dose given. Concern for on-going bleeding.   Date  INR  Dose 8/8   2.6    1.25 mg 8/9  4.5    HOLD  8/10  5.2  HOLD 8/11  4.0  HOLD 8/12  3.8  HOLD 8/13  2.9  0.5 mg 8/14  2.8  HOLD  Goal of Therapy:  INR 2-3   Plan:   Hold warfarin tonight per MD  Re-check INR with AM labs  Continue to monitor for s/sx of bleeding  Benita Gutter, PharmD  Pharmacy Resident 09/01/2018,3:24 PM

## 2018-09-01 NOTE — Progress Notes (Signed)
[facility]  ROUNDING NOTE   Subjective:  Pt doing better. Cr down to 1.8.     Objective:  Vital signs in last 24 hours:  Temp:  [97.9 F (36.6 C)-98.3 F (36.8 C)] 98.1 F (36.7 C) (08/14 0501) Pulse Rate:  [78-90] 90 (08/14 0501) Resp:  [18-20] 20 (08/14 0501) BP: (140-151)/(72-96) 143/78 (08/14 0501) SpO2:  [100 %] 100 % (08/14 0501)  Weight change:  Filed Weights   08/29/18 1153 08/30/18 0405 08/31/18 0500  Weight: 120 kg 122.2 kg 119 kg    Intake/Output: I/O last 3 completed shifts: In: 290 [P.O.:240; IV Piggyback:50] Out: 1000 [Urine:1000]   Intake/Output this shift:  No intake/output data recorded.  Physical Exam: General: No acute distress  Head: Normocephalic, atraumatic. Moist oral mucosal membranes  Eyes: Anicteric  Neck: Supple, trachea midline  Lungs:  Clear to auscultation, normal effort  Heart: S1S2 no rubs  Abdomen:  Soft, nontender, bowel sounds present  Extremities: Trace peripheral edema.  Neurologic: Awake, alert, following commands  Skin: No lesions       Basic Metabolic Panel: Recent Labs  Lab 08/28/18 0554 08/29/18 0608 08/30/18 0615 08/31/18 0552 09/01/18 0631  NA 133* 135 138 141 141  K 4.3 4.5 4.2 4.1 4.6  CL 101 102 105 106 109  CO2 21* 21* 22 24 25   GLUCOSE 93 101* 99 102* 113*  BUN 58* 68* 65* 62* 49*  CREATININE 4.10* 3.79* 2.86* 2.38* 1.77*  CALCIUM 8.2* 8.9 9.0 8.7* 8.6*  MG  --  2.6*  --   --  2.7*    Liver Function Tests: No results for input(s): AST, ALT, ALKPHOS, BILITOT, PROT, ALBUMIN in the last 168 hours. No results for input(s): LIPASE, AMYLASE in the last 168 hours. No results for input(s): AMMONIA in the last 168 hours.  CBC: Recent Labs  Lab 08/27/18 0420 08/28/18 0554 08/29/18 0608 08/30/18 0615 08/31/18 0552  WBC 16.8* 15.3* 15.3* 12.4* 12.5*  HGB 6.9* 8.3* 8.7* 7.9* 9.0*  HCT 22.4* 25.9* 27.4* 25.3* 29.0*  MCV 85.5 83.5 84.8 86.1 86.3  PLT 239 220 227 227 257    Cardiac  Enzymes: No results for input(s): CKTOTAL, CKMB, CKMBINDEX, TROPONINI in the last 168 hours.  BNP: Invalid input(s): POCBNP  CBG: Recent Labs  Lab 08/28/18 1634  GLUCAP 98    Microbiology: Results for orders placed or performed during the hospital encounter of 08/26/18  Blood culture (routine x 2)     Status: None   Collection Time: 08/26/18  3:05 PM   Specimen: BLOOD  Result Value Ref Range Status   Specimen Description BLOOD RIGHT ANTECUBITAL  Final   Special Requests   Final    BOTTLES DRAWN AEROBIC AND ANAEROBIC Blood Culture adequate volume   Culture   Final    NO GROWTH 5 DAYS Performed at [facility], [address] [place], [place] [address]    Report Status 08/31/2018 FINAL  Final  Blood culture (routine x 2)     Status: None   Collection Time: 08/26/18  3:05 PM   Specimen: BLOOD  Result Value Ref Range Status   Specimen Description BLOOD BLOOD LEFT HAND  Final   Special Requests   Final    BOTTLES DRAWN AEROBIC AND ANAEROBIC Blood Culture adequate volume   Culture   Final    NO GROWTH 5 DAYS Performed at [facility], [address] [place], [place] [address]    Report Status 08/31/2018 FINAL  Final  Urine culture     Status: Abnormal   Collection Time: 08/26/18  4:32 PM   Specimen: Urine, Clean Catch  Result Value Ref Range Status   Specimen Description   Final    URINE, CLEAN CATCH Performed at [facility], [address] [place], [place] [address]    Special Requests   Final    Normal Performed at [facility], [place], [place] [address]    Culture >=100,000 COLONIES/mL ESCHERICHIA COLI (A)  Final   Report Status 08/29/2018 FINAL  Final   Organism ID, Bacteria ESCHERICHIA COLI (A)  Final      Susceptibility   Escherichia coli - MIC*    AMPICILLIN <=2 SENSITIVE Sensitive     CEFAZOLIN <=4 SENSITIVE Sensitive     CEFTRIAXONE <=1 SENSITIVE Sensitive     CIPROFLOXACIN >=4 RESISTANT  Resistant     GENTAMICIN <=1 SENSITIVE Sensitive     IMIPENEM <=0.25 SENSITIVE Sensitive     NITROFURANTOIN <=16 SENSITIVE Sensitive     TRIMETH/SULFA >=320 RESISTANT Resistant     AMPICILLIN/SULBACTAM <=2 SENSITIVE Sensitive     PIP/TAZO <=4 SENSITIVE Sensitive     Extended ESBL NEGATIVE Sensitive     * >=100,000 COLONIES/mL ESCHERICHIA COLI  SARS Coronavirus 2 [facility] order, Performed in [facility] hospital lab) Nasopharyngeal Nasopharyngeal Swab     Status: None   Collection Time: 08/26/18  4:34 PM   Specimen: Nasopharyngeal Swab  Result Value Ref Range Status   SARS Coronavirus 2 NEGATIVE NEGATIVE Final    Comment: (NOTE) If result is NEGATIVE SARS-CoV-2 target nucleic acids are NOT DETECTED. The SARS-CoV-2 RNA is generally detectable in upper and lower  respiratory specimens during the acute phase of infection. The lowest  concentration of SARS-CoV-2 viral copies this assay can detect is 250  copies / mL. A negative result does not preclude SARS-CoV-2 infection  and should not be used as the sole basis for treatment or other  patient management decisions.  A negative result may occur with  improper specimen collection / handling, submission of specimen other  than nasopharyngeal swab, presence of viral mutation(s) within the  areas targeted by this assay, and inadequate number of viral copies  (<250 copies / mL). A negative result must be combined with clinical  observations, patient history, and epidemiological information. If result is POSITIVE SARS-CoV-2 target nucleic acids are DETECTED. The SARS-CoV-2 RNA is generally detectable in upper and lower  respiratory specimens dur ing the acute phase of infection.  Positive  results are indicative of active infection with SARS-CoV-2.  Clinical  correlation with patient history and other diagnostic information is  necessary to determine patient infection status.  Positive results do  not rule out bacterial infection or  co-infection with other viruses. If result is PRESUMPTIVE POSTIVE SARS-CoV-2 nucleic acids MAY BE PRESENT.   A presumptive positive result was obtained on the submitted specimen  and confirmed on repeat testing.  While 2019 novel coronavirus  (SARS-CoV-2) nucleic acids may be present in the submitted sample  additional confirmatory testing may be necessary for epidemiological  and / or clinical management purposes  to differentiate between  SARS-CoV-2 and other Sarbecovirus currently known to infect humans.  If clinically indicated additional testing with an alternate test  methodology [id number]) is advised. The SARS-CoV-2 RNA is generally  detectable in upper and lower respiratory sp ecimens during the acute  phase of infection. The expected result is Negative. Fact Sheet for Patients:  StrictlyIdeas.no Fact  Sheet for Healthcare Providers: BankingDealers.co.za This test is not yet approved or cleared by the [place] and has been authorized for detection and/or diagnosis of SARS-CoV-2 by FDA under an Emergency Use Authorization (EUA).  This EUA will remain in effect (meaning this test can be used) for the duration of the COVID-19 declaration under Section 564(b)(1) of the Act, 21 U.S.C. section 360bbb-3(b)(1), unless the authorization is terminated or revoked sooner. Performed at [facility], [address] [place], [place] [address]     Coagulation Studies: Recent Labs    08/30/18 0615 08/31/18 0552 09/01/18 0631  LABPROT 36.7* 30.0* 28.9*  INR 3.8* 2.9* 2.8*    Urinalysis: No results for input(s): COLORURINE, LABSPEC, PHURINE, GLUCOSEU, HGBUR, BILIRUBINUR, KETONESUR, PROTEINUR, UROBILINOGEN, NITRITE, LEUKOCYTESUR in the last 72 hours.  Invalid input(s): APPERANCEUR    Imaging: No results found.   Medications:    . atorvastatin  40 mg Oral q1800  . cephALEXin  500 mg Oral Q12H  . diltiazem  240  mg Oral Daily  . docusate sodium  100 mg Oral BID  . furosemide  40 mg Oral Daily  . metoprolol succinate  100 mg Oral Daily  . montelukast  10 mg Oral QHS  . pantoprazole  40 mg Oral Daily  . patiromer  8.4 g Oral Daily  . predniSONE  40 mg Oral Q breakfast  . tamsulosin  0.4 mg Oral QPM  . Warfarin - Pharmacist Dosing Inpatient   Does not apply q1800   acetaminophen **OR** acetaminophen, ALPRAZolam, bisacodyl, HYDROcodone-acetaminophen, ipratropium-albuterol, ondansetron **OR** ondansetron (ZOFRAN) IV, senna  Assessment/ Plan:  53 y.o. female  with with medical problems of CHF,Interstitial lung disease, HTN, A fib , who was admitted to [facility] on 08/26/2018 for evaluation of abdominal pain.  Diagnosed with sepsis due to acute pyelonephritis and ARF   1.  Acute kidney injury likely secondary to ongoing infection/pyelonephritis/CKD stage III. Baseline creatinine 1.25/GFR 57 from August 15, 2018 An 8 mm  nonobstructing calculus is seen in the right kidney on the most recent CT -Creatinine down to 1.8 today.  Continue to periodically monitor.  She will need outpatient follow-up for this issue.  2.  Hyperkalemia.  Potassium up a bit to 4.6.  Maintain the patient on potassium are 8.4 g daily for now.  Consider stopping this if potassium stabilizes.  3.  Hypertension.  Continue diltiazem and metoprolol.  4.  Gout.  Patient on prednisone 40 mg daily for now.   LOS: 6 M[place]sha Dorgan 8/14/202011:39 AM

## 2018-09-01 NOTE — Progress Notes (Signed)
Progress Note  Patient Name: Leslie [name] Date of Encounter: 09/01/2018  Primary Cardiologist: [place] much better. No chest pain. SOB much improved. Renal function has improved to 1.77 today. Has been placed on PO Lasix 40 mg daily. Now on room air.   Inpatient Medications    Scheduled Meds: . atorvastatin  40 mg Oral q1800  . cephALEXin  500 mg Oral Q12H  . diltiazem  240 mg Oral Daily  . docusate sodium  100 mg Oral BID  . furosemide  40 mg Oral Daily  . metoprolol succinate  100 mg Oral Daily  . montelukast  10 mg Oral QHS  . pantoprazole  40 mg Oral Daily  . patiromer  8.4 g Oral Daily  . predniSONE  40 mg Oral Q breakfast  . tamsulosin  0.4 mg Oral QPM  . Warfarin - Pharmacist Dosing Inpatient   Does not apply q1800   Continuous Infusions:  PRN Meds: acetaminophen **OR** acetaminophen, ALPRAZolam, bisacodyl, HYDROcodone-acetaminophen, ipratropium-albuterol, ondansetron **OR** ondansetron (ZOFRAN) IV, senna   Vital Signs    Vitals:   08/31/18 0844 08/31/18 1223 08/31/18 2124 09/01/18 0501  BP: (!) 144/75 (!) 140/96 (!) 151/72 (!) 143/78  Pulse: 92 78 81 90  Resp:  18 20 20   Temp:  97.9 F (36.6 C) 98.3 F (36.8 C) 98.1 F (36.7 C)  TempSrc:  Oral Oral Oral  SpO2:  100% 100% 100%  Weight:      Height:        Intake/Output Summary (Last 24 hours) at 09/01/2018 1122 Last data filed at 09/01/2018 1029 Gross per 24 hour  Intake 50 ml  Output -  Net 50 ml   Filed Weights   08/29/18 1153 08/30/18 0405 08/31/18 0500  Weight: 120 kg 122.2 kg 119 kg    Telemetry    Sinus rhythm currently in the 80s to 90s bpm with episodes of sinus tachycardia into the 140s bpm this morning - Personally Reviewed  ECG    n/a - Personally Reviewed  Physical Exam   GEN: No acute distress.   Neck: No JVD. Cardiac: RRR, I/VI diastolic murmur, no rubs, or gallops.  Respiratory: Diminished breath sounds bilaterally.  GI: Soft, nontender, mildly  distended.   MS: Trace bilateral pretibial edema; No deformity. Neuro:  Alert and oriented x 3; Nonfocal.  Psych: Normal affect.  Labs    Chemistry Recent Labs  Lab 08/30/18 0615 08/31/18 0552 09/01/18 0631  NA 138 141 141  K 4.2 4.1 4.6  CL 105 106 109  CO2 22 24 25   GLUCOSE 99 102* 113*  BUN 65* 62* 49*  CREATININE 2.86* 2.38* 1.77*  CALCIUM 9.0 8.7* 8.6*  GFRNONAA 18* 23* 32*  GFRAA 21* 26* 38*  ANIONGAP 11 11 7      Hematology Recent Labs  Lab 08/29/18 0608 08/30/18 0615 08/31/18 0552  WBC 15.3* 12.4* 12.5*  RBC 3.23* 2.94* 3.36*  HGB 8.7* 7.9* 9.0*  HCT 27.4* 25.3* 29.0*  MCV 84.8 86.1 86.3  MCH 26.9 26.9 26.8  MCHC 31.8 31.2 31.0  RDW 16.9* 16.7* 16.4*  PLT 227 227 257    Cardiac EnzymesNo results for input(s): TROPONINI in the last 168 hours. No results for input(s): TROPIPOC in the last 168 hours.   BNP Recent Labs  Lab 08/26/18 1221 08/26/18 2024  BNP 479.0* 539.0*     DDimer No results for input(s): DDIMER in the last 168 hours.   Radiology  No results found.  Cardiac Studies   08/29/2018 Echo 1. The left ventricle has hyperdynamic systolic function, with an ejection fraction of >65%. The cavity size was normal. Left ventricular diastolic Doppler parameters are indeterminate. 2. The right ventricle has normal systolic function. The cavity was mildly enlarged. There is no increase in right ventricular wall thickness. Right ventricular systolic pressure could not be assessed. 3. Left atrial size was mildly dilated. 4. The mitral valve was not well visualized. Moderate thickening of the mitral valve leaflet. Moderate-severe mitral valve stenosis, with mean gradient of 16 mmHg and MVA by continuity equation of 1.0 cm^2. MVA by PHT calculated at 3.5 cm^2 but is  likely an overestimation. 5. The aortic valve was not well visualized. 6. The aorta is normal in size and structure. 7. The interatrial septum was not well visualized.   09/13/2017 TEE Study Conclusions - Left ventricle: The cavity size was normal. Systolic function was normal. The estimated ejection fraction was in the range of 60% to 65%. - Aortic valve: No evidence of vegetation. There was trivial regurgitation. - Mitral valve: Mobility of the posterior leaflet was restricted. No evidence of vegetation. Transvalvular velocity was increased. The findings are consistent with moderate stenosis. Mean gradient (D): 14 mm Hg. Planimetered valve area: 2.59 cm^2. Valve area by pressure half-time: 2.72 cm^2. Valve area (PISA): 1.12 cm^2. - Left atrium: No evidence of thrombus in the atrial cavity or appendage. - Right ventricle: The cavity size was normal. Systolic function was normal. - Right atrium: No evidence of thrombus in the atrial cavity or appendage. - Atrial septum: Doppler showed no atrial level shunt, in the baseline state. - Tricuspid valve: No evidence of vegetation. - Pulmonic valve: No evidence of vegetation.  09/12/2017 Echo Study Conclusions - Left ventricle: The cavity size was normal. Wall thickness was normal. Systolic function was normal. The estimated ejection fraction was in the range of 55% to 60%. Wall motion was normal; there were no regional wall motion abnormalities. Features are consistent with a pseudonormal left ventricular filling pattern, with concomitant abnormal relaxation and increased filling pressure (grade 2 diastolic dysfunction). - Mitral valve: Poorly visualized. The findings are consistent with moderate stenosis. Mean gradient (D): 15 mm Hg. Valve area by pressure half-time: 1.62 cm^2. Valve area by continuity equation (using LVOT flow): 2.15 cm^2. Impressions: - Significantly elevated mitral valve gradient but the valve is not well visualized. Left atrial size is normal which argues against severe stenosis. Consider a TEE if clinically indicated.  Patient  Profile     53 y.o. 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  Assessment & Plan    1. Acute on CKD stage II: -In the setting of renal stone s/p ureteral stent and UTI -Improving -Started back on PO Lasix 40 mg daily today -Follow up with Urology and nephrology   2. Acute on chronic HFpEF: -She is likely still mildly volume up -Restarted on PO Lasix 40 mg daily today -She will need close follow up within the next week to assess her volume status and renal function  3. Mitral stenosis: -Likely exacerbating her overall presentation  -Minimally changed over the past year when compared to TEE with a mean gradient of 16 mmHg -Follow up as an outpatient  4. PAF: -Maintaining sinus rhythm -Coumadin per pharmacy  with INR of 2.8 today  5. Sinus tachycardia: -Improved -Likely multifactorial including anemia, obesity, and physical deconditioning   6. Anemia with occult blood positive: -Has required 2 units of pRBC this admission -Recommend further workup per IM prior to discharge   For questions or updates, please contact [place] Please consult [email or web address] for contact info under Cardiology/STEMI.    Signed, Christell Faith, PA-C [facility] Pager: [phone] 09/01/2018, 11:22 AM

## 2018-09-04 ENCOUNTER — Other Ambulatory Visit: Payer: Self-pay | Admitting: Nurse Practitioner

## 2018-09-04 ENCOUNTER — Ambulatory Visit (INDEPENDENT_AMBULATORY_CARE_PROVIDER_SITE_OTHER): Payer: Self-pay

## 2018-09-04 ENCOUNTER — Other Ambulatory Visit: Payer: Self-pay

## 2018-09-04 DIAGNOSIS — I4891 Unspecified atrial fibrillation: Secondary | ICD-10-CM

## 2018-09-04 DIAGNOSIS — Z5181 Encounter for therapeutic drug level monitoring: Secondary | ICD-10-CM

## 2018-09-04 DIAGNOSIS — I050 Rheumatic mitral stenosis: Secondary | ICD-10-CM

## 2018-09-04 LAB — POCT INR: INR: 2.5 (ref 2.0–3.0)

## 2018-09-04 MED ORDER — TIZANIDINE HCL 4 MG PO TABS: 4.0000 mg | ORAL_TABLET | Freq: Four times a day (QID) | ORAL | 0 refills | Status: DC | PRN

## 2018-09-04 NOTE — Patient Instructions (Signed)
Please continue of 1 tablet (2.5 mg) every day EXCEPT 1/2 TABLET ON MONDAYS, [place]. Recheck in 6 weeks.

## 2018-09-06 ENCOUNTER — Inpatient Hospital Stay: Admission: RE | Admit: 2018-09-06 | Payer: Managed Care, Other (non HMO) | Source: Ambulatory Visit

## 2018-09-06 ENCOUNTER — Encounter: Payer: Self-pay | Admitting: Nurse Practitioner

## 2018-09-06 NOTE — Telephone Encounter (Signed)
Hey. Can you print these out for me. I don't know what the plan is for her. Are we the ones who should be filling tis out?

## 2018-09-07 ENCOUNTER — Other Ambulatory Visit: Payer: Managed Care, Other (non HMO)

## 2018-09-08 ENCOUNTER — Other Ambulatory Visit: Payer: Self-pay | Admitting: Nurse Practitioner

## 2018-09-08 MED ORDER — FLUCONAZOLE 150 MG PO TABS: ORAL_TABLET | ORAL | 0 refills | Status: DC

## 2018-09-08 MED ORDER — CEPHALEXIN 500 MG PO CAPS: 500.0000 mg | ORAL_CAPSULE | Freq: Two times a day (BID) | ORAL | 0 refills | Status: DC

## 2018-09-11 ENCOUNTER — Ambulatory Visit: Admit: 2018-09-11 | Payer: Managed Care, Other (non HMO) | Admitting: Orthopedic Surgery

## 2018-09-11 SURGERY — ARTHROSCOPY, KNEE
Anesthesia: Choice | Site: Knee | Laterality: Right

## 2018-09-13 ENCOUNTER — Encounter: Payer: Self-pay | Admitting: Adult Health

## 2018-09-13 ENCOUNTER — Other Ambulatory Visit: Payer: Self-pay

## 2018-09-13 ENCOUNTER — Ambulatory Visit: Payer: Managed Care, Other (non HMO) | Admitting: Adult Health

## 2018-09-13 VITALS — BP 134/65 | HR 80 | Temp 97.9°F | Resp 16 | Ht 64.0 in | Wt 247.0 lb

## 2018-09-13 DIAGNOSIS — N12 Tubulo-interstitial nephritis, not specified as acute or chronic: Secondary | ICD-10-CM

## 2018-09-13 DIAGNOSIS — F17219 Nicotine dependence, cigarettes, with unspecified nicotine-induced disorders: Secondary | ICD-10-CM | POA: Diagnosis not present

## 2018-09-13 DIAGNOSIS — N183 Chronic kidney disease, stage 3 unspecified: Secondary | ICD-10-CM

## 2018-09-13 DIAGNOSIS — D531 Other megaloblastic anemias, not elsewhere classified: Secondary | ICD-10-CM | POA: Diagnosis not present

## 2018-09-13 NOTE — Progress Notes (Signed)
[facility] [place], [place] [address]  Internal MEDICINE  Office Visit Note  Patient Name: Leslie [name]  [id number]  [id number]  Date of Service: 09/13/2018     Chief Complaint  Patient presents with  . Blood Infection    had infection from taken out stent from surgery, was in hospital for 6 days, still not completely feeling well after two rounds or oral antibiotics at home      HPI Pt is here for recent hospital follow up. Presented to emergency department 08/26/2018 with continued dysuria, hematuria and flank pain. Recent ureter stent placement which was removed and persistent pain and other symptoms. Patient admitted to hospital with urosepsis and acute renal failure, spent 6 days in hospital, followed by nephrologist. Hemoglobin dropped to 6.9 and positive hemoccult requiring blood transfusion, to follow up with GI outpatient. Discharged on bactrim and finished course.  Reports today that she still feels very weak, racing heart and mild flank pain. Denies chest pain, fever or shortness of breath.  Current Medication: Outpatient Encounter Medications as of 09/13/2018  Medication Sig  . albuterol (VENTOLIN HFA) 108 (90 Base) MCG/ACT inhaler Inhale 2 puffs into the lungs every 4 (four) hours as needed for wheezing or shortness of breath.  . ALPRAZolam (XANAX) 0.5 MG tablet Take 1 tablet (0.5 mg total) by mouth at bedtime as needed for anxiety.  Marland Kitchen atorvastatin (LIPITOR) 40 MG tablet Take 1 tablet (40 mg total) by mouth daily at 6 PM.  . diltiazem (CARDIZEM CD) 240 MG 24 hr capsule TAKE 1 CAPSULE DAILY  . estradiol (ESTRACE) 0.5 MG tablet Take 1 tablet (0.5 mg total) by mouth daily.  . fluconazole (DIFLUCAN) 150 MG tablet Take one tablet and if symptoms persist repeat in three days  . furosemide (LASIX) 40 MG tablet Take 1 tablet (40 mg total) by mouth daily.  . metoprolol succinate (TOPROL-XL) 25 MG 24 hr tablet Take 1 tablet (25 mg total) by mouth daily.  Take with or immediately following a meal.  . montelukast (SINGULAIR) 10 MG tablet Take 1 tablet (10 mg total) by mouth at bedtime.  Marland Kitchen omeprazole (PRILOSEC) 40 MG capsule Take 1 capsule (40 mg total) by mouth daily.  . potassium chloride (K-DUR) 10 MEQ tablet Take 2 tablets (20 meq) by mouth once daily (Patient taking differently: Take 20 mEq by mouth daily. )  . tamsulosin (FLOMAX) 0.4 MG CAPS capsule Take 0.4 mg by mouth every evening.   Marland Kitchen tiZANidine (ZANAFLEX) 4 MG tablet Take 1 tablet (4 mg total) by mouth every 6 (six) hours as needed for muscle spasms.  Marland Kitchen warfarin (COUMADIN) 2.5 MG tablet TAKE 1/2 TO 1 TABLET DAILY AS DIRECTED BY COUMADIN CLINIC. (Patient taking differently: Take 1.25-2.5 mg by mouth See admin instructions. Take 2.5 mg at night on Mon, Wed, and Fri.  Take 1.25 mg at night on Sun, Tue, Thur, and Sat)  . cephALEXin (KEFLEX) 500 MG capsule Take 1 capsule (500 mg total) by mouth 2 (two) times daily. (Patient not taking: Reported on 09/13/2018)   No facility-administered encounter medications on file as of 09/13/2018.     Surgical History: Past Surgical History:  Procedure Laterality Date  . ABDOMINAL HYSTERECTOMY     total  . ABDOMINAL HYSTERECTOMY    . BREAST BIOPSY Bilateral 2012  . BREAST BIOPSY Right 08-07-12   fibroadenomatous changes and columnar cells  . BREAST BIOPSY  02/03/2015   stereo byrnett  . CHOLECYSTECTOMY N/A 02/27/2016   Procedure:  LAPAROSCOPIC CHOLECYSTECTOMY;  Surgeon: Christene Lye, MD;  Location: [facility];  Service: General;  Laterality: N/A;  . CYSTOSCOPY W/ RETROGRADES Right 08/18/2018   Procedure: CYSTOSCOPY WITH RETROGRADE PYELOGRAM;  Surgeon: Billey Co, MD;  Location: [facility];  Service: Urology;  Laterality: Right;  . CYSTOSCOPY/URETEROSCOPY/HOLMIUM LASER/STENT PLACEMENT Right 08/18/2018   Procedure: CYSTOSCOPY/URETEROSCOPY/STENT PLACEMENT;  Surgeon: Billey Co, MD;  Location: [facility];  Service: Urology;  Laterality: Right;  .  JOINT REPLACEMENT Left   . KNEE CLOSED REDUCTION Left 04/15/2015   Procedure: CLOSED MANIPULATION KNEE;  Surgeon: Thornton Park, MD;  Location: [facility];  Service: Orthopedics;  Laterality: Left;  . KNEE SURGERY    . TEE WITHOUT CARDIOVERSION N/A 09/13/2017   Procedure: TRANSESOPHAGEAL ECHOCARDIOGRAM (TEE);  Surgeon: Nelva Bush, MD;  Location: [facility];  Service: Cardiovascular;  Laterality: N/A;  . TOTAL KNEE ARTHROPLASTY Left 12/25/2014   Procedure: TOTAL KNEE ARTHROPLASTY;  Surgeon: Thornton Park, MD;  Location: [facility];  Service: Orthopedics;  Laterality: Left;  . TUBAL LIGATION      Medical History: Past Medical History:  Diagnosis Date  . (HFpEF) heart failure with preserved ejection fraction ([place])    a. 08/2017 Echo: EF 55-60%.  Grade 2 diastolic dysfunction.  Moderate mitral stenosis.  . Allergy   . Anemia   . Anxiety   . BRCA negative 03/22/2013  . Bronchitis 02/19/2016   ON LEVAQUIN PO  . Chest tightness   . CHF (congestive heart failure) ([place])   . Cigarette smoker 09/11/2017   8-10 day  . GERD (gastroesophageal reflux disease)   . History of kidney stones   . Hyperlipidemia   . Hypertension   . Interstitial lung disease ([place])    a. CT 2013 b. 02/2018 CXR noted recurrent intersistial changes ILD vs chronic bronchitis  . Moderate mitral stenosis    a.  08/2017 TEE: EF 60 to 65%.  Moderate mitral stenosis.  Mean gradient 14 mmHg.  Valve area 2.59 cm by planimetry, 2.72 cm by pressure half-time.  . Palpitations   . Persistent atrial fibrillation    a. 08/2017 s/p TEE/DCCV; b. CHA2DS2VASc = 2-->warfarin.    Family History: Family History  Problem Relation Age of Onset  . Cancer Mother 22       breast  . Cancer Maternal Aunt        breast  . Cancer Maternal Grandmother        breast    Social History   Socioeconomic History  . Marital status: Married    Spouse name: Not on file  . Number of children: Not on file  . Years of education: Not on file  .  Highest education level: Not on file  Occupational History  . Not on file  Social Needs  . Financial resource strain: Not on file  . Food insecurity    Worry: Not on file    Inability: Not on file  . Transportation needs    Medical: Not on file    Non-medical: Not on file  Tobacco Use  . Smoking status: Former Smoker    Packs/day: 0.50    Years: 20.00    Pack years: 10.00    Types: Cigarettes    Quit date: 02/20/2018    Years since quitting: 0.5  . Smokeless tobacco: Never Used  . Tobacco comment: down to 2-3 daily  Substance and Sexual Activity  . Alcohol use: Yes    Alcohol/week: 2.0 standard drinks    Types: 2 Glasses  of wine per week    Comment: wine occ  . Drug use: No  . Sexual activity: Not on file  Lifestyle  . Physical activity    Days per week: Not on file    Minutes per session: Not on file  . Stress: Not on file  Relationships  . Social Herbalist on phone: Not on file    Gets together: Not on file    Attends religious service: Not on file    Active member of club or organization: Not on file    Attends meetings of clubs or organizations: Not on file    Relationship status: Not on file  . Intimate partner violence    Fear of current or ex partner: Not on file    Emotionally abused: Not on file    Physically abused: Not on file    Forced sexual activity: Not on file  Other Topics Concern  . Not on file  Social History Narrative  . Not on file      Review of Systems  Constitutional: Negative for chills, fatigue and unexpected weight change.  HENT: Negative for congestion, rhinorrhea, sneezing and sore throat.   Eyes: Negative for photophobia, pain and redness.  Respiratory: Negative for cough, chest tightness and shortness of breath.   Cardiovascular: Negative for chest pain and palpitations.  Gastrointestinal: Negative for abdominal pain, constipation, diarrhea, nausea and vomiting.  Endocrine: Negative.   Genitourinary: Negative for  dysuria and frequency.  Musculoskeletal: Negative for arthralgias, back pain, joint swelling and neck pain.  Skin: Negative for rash.  Allergic/Immunologic: Negative.   Neurological: Negative for tremors and numbness.  Hematological: Negative for adenopathy. Does not bruise/bleed easily.  Psychiatric/Behavioral: Negative for behavioral problems and sleep disturbance. The patient is not nervous/anxious.     Vital Signs: Resp 16   Ht _0  (1.626 m)   Wt 247 lb (112 kg)   BMI 42.40 kg/m    Physical Exam Vitals signs and nursing note reviewed.  Constitutional:      General: She is not in acute distress.    Appearance: She is well-developed. She is not diaphoretic.  HENT:     Head: Normocephalic and atraumatic.     Mouth/Throat:     Pharynx: No oropharyngeal exudate.  Eyes:     Pupils: Pupils are equal, round, and reactive to light.  Neck:     Musculoskeletal: Normal range of motion and neck supple.     Thyroid: No thyromegaly.     Vascular: No JVD.     Trachea: No tracheal deviation.  Cardiovascular:     Rate and Rhythm: Normal rate and regular rhythm.     Heart sounds: Normal heart sounds. No murmur. No friction rub. No gallop.   Pulmonary:     Effort: Pulmonary effort is normal. No respiratory distress.     Breath sounds: Normal breath sounds. No wheezing or rales.  Chest:     Chest wall: No tenderness.  Abdominal:     Palpations: Abdomen is soft.     Tenderness: There is no abdominal tenderness. There is no guarding.  Musculoskeletal: Normal range of motion.  Lymphadenopathy:     Cervical: No cervical adenopathy.  Skin:    General: Skin is warm and dry.  Neurological:     Mental Status: She is alert and oriented to person, place, and time.     Cranial Nerves: No cranial nerve deficit.  Psychiatric:  Behavior: Behavior normal.        Thought Content: Thought content normal.        Judgment: Judgment normal.       Assessment/Plan: 1.  Pyelonephritis Resolved, antibiotics complete.    2. Stage 3 chronic kidney disease (Leslie [name]) Keep appt to see Nephrology.   3. Other megaloblastic anemia Lab slip given to patient to have Hemoglobin rechecked. She will follow up with GI as scheduled.   4. Cigarette nicotine dependence with nicotine-induced disorder Smoking cessation counseling: 1. Pt acknowledges the risks of long term smoking, she will try to quite smoking. 2. Options for different medications including nicotine products, chewing gum, patch etc, Wellbutrin and Chantix is discussed 3. Goal and date of compete cessation is discussed 4. Total time spent in smoking cessation is 15 min.   General Counseling: Leslie [name] verbalizes understanding of the findings of todays visit and agrees with plan of treatment. I have discussed any further diagnostic evaluation that may be needed or ordered today. We also reviewed her medications today. she has been encouraged to call the office with any questions or concerns that should arise related to todays visit.     No orders of the defined types were placed in this encounter.     I have reviewed all medical records from hospital follow up including radiology reports and consults from other physicians. Appropriate follow up diagnostics will be scheduled as needed. Patient/ Family understands the plan of treatment.   Time spent 25 minutes.   Orson Gear AGNP-C Internal Medicine

## 2018-09-14 ENCOUNTER — Other Ambulatory Visit: Payer: Self-pay | Admitting: Adult Health

## 2018-09-14 LAB — CBC WITH DIFFERENTIAL/PLATELET
Basophils Absolute: 0.1 10*3/uL (ref 0.0–0.2)
Basos: 1 %
EOS (ABSOLUTE): 0.3 10*3/uL (ref 0.0–0.4)
Eos: 4 %
Hematocrit: 33.7 % — ABNORMAL LOW (ref 34.0–46.6)
Hemoglobin: 10.7 g/dL — ABNORMAL LOW (ref 11.1–15.9)
Immature Grans (Abs): 0 10*3/uL (ref 0.0–0.1)
Immature Granulocytes: 0 %
Lymphocytes Absolute: 1.6 10*3/uL (ref 0.7–3.1)
Lymphs: 21 %
MCH: 27.4 pg (ref 26.6–33.0)
MCHC: 31.8 g/dL (ref 31.5–35.7)
MCV: 86 fL (ref 79–97)
Monocytes Absolute: 0.8 10*3/uL (ref 0.1–0.9)
Monocytes: 10 %
Neutrophils Absolute: 4.9 10*3/uL (ref 1.4–7.0)
Neutrophils: 64 %
Platelets: 485 10*3/uL — ABNORMAL HIGH (ref 150–450)
RBC: 3.9 x10E6/uL (ref 3.77–5.28)
RDW: 15.3 % (ref 11.7–15.4)
WBC: 7.8 10*3/uL (ref 3.4–10.8)

## 2018-09-17 NOTE — Progress Notes (Signed)
Cardiology Office Note    Date:  09/19/2018   ID:  Leslie [name], DOB 1966-01-06, MRN [id number]  PCP:  Ronnell Freshwater, NP  Cardiologist:  Ida Rogue, MD  Electrophysiologist:  None   Chief Complaint: Hospital follow up  History of Present Illness:   Leslie [name] is a 53 y.o. female with history of nonobstructive CAD by coronary CTA in 02/2018, PAF on Coumadin status post TEE guided cardioversion in 08/2017, HFpEF, moderate mitral valve stenosis, paroxysmal SVT, anemia requiring packed red blood cell transfusion x 2 with noted occult positive stools in 08/2018, CKD stage III, hypertension, hyperlipidemia, tobacco abuse, obesity, sleep apnea, and anxiety who presents for hospital follow-up.  Patient'sA. fib was diagnosed in 08/2017 in the setting of hospitalization for chest tightness. She underwent TEE which showed moderate mitral stenosis. She was subsequently cardioverted. She has been anticoagulated on Coumadin in the setting of a valvular A. fib. She was seen in the office on 01/06/2018 noting she had been doing well up until 01/05/2018 when she awoke suddenly with tachypalpitations and heart rates in the 130s beats per minute. EMS was called to her house,upon their arrival the patient was feeling somewhat better and was noted to be in sinus rhythm with heart rates in the 80s to 90s bpm. She continued to note symptoms concerning for recurrence of A. fib prompting her to be seen in 12/2017. EKG at that time showed sinus rhythm. Her diltiazem was increased to 240 mg daily. In the setting of chest tightness associated with her palpitations, coronary CTA was performed in 02/2018,which showed distal left main 0 to 25% calcification, 0 to 25% LAD calcification, trivial plaque within the LCx.  Calcium score of 10.  Dilated pulmonary artery measuring 35 mm suggestive of pulmonary hypertension.  Outpatient cardiac monitoring given her palpitations showed NSR, average heart rate 82 bpm, 5  SVT/atrial tachycardia runs occurred with the fastest interval lasting 5 beats with a maximal rate of 145 bpm and the longest interval lasting 16 beats with an average rate of 114 bpm.  Patient triggered events were not associated with significant arrhythmia.  She was seen in the ED on 01/17/2018 with continued intermittent palpitations and chest pain.  Work-up was unrevealing outside of a potassium of 2.8.  She had a mechanical fall and 12/2017 with a large left buttock and left lateral thigh hematoma noted on CT.  More recently, she was seen in the office on 08/15/2018 with a 12 pound weight gain in the prior month.  She had self increased her Lasix to 20 mg twice daily for 2 weeks prior to being seen without significant improvement in swelling and weight gain.  She was advised to increase Lasix to 40 mg in the morning and 20 mg in the afternoon.  However, she was subsequently admitted to the hospital in early 08/2018 with dysuria, hematuria, and flank pain and found to have an obstructing renal stone requiring ureteral stenting complicated by AKI.  Echo on 08/29/2018 showed an EF greater than 65%, normal RV systolic function, mildly enlarged RV cavity, mildly dilated left atrium, moderate to severe mitral valve stenosis with a mean gradient of 16 mmHg and MVA by continuity equation of 1.0 cm with an AVA by PHT calculated at 3.5 cm felt to be likely an overestimation, as these findings were minimally changed over the prior year.  Labs revealed acute kidney injury with a creatinine of 2.1 with prior 1.3 from 08/15/2018 in the setting of a renal stone  status post ureteral stent and UTI.  In this setting, diuresis was held and she was gently hydrated.  Her nonspecific chest pain was felt to be noncardiac giving reassuring cardiac CTA earlier in 2020.  She was also noted to have worsening anemia with occult positive stools and required 2 units of packed red blood cells during the admission.  Prior to discharge, she  was restarted on Lasix 40 mg daily.  Documented discharge weight 119 kg.  Patient comes in doing reasonably well.  She indicates she was feeling quite well following her hospital discharge up until approximately 3 to 4 days prior when she began to notice increase in palpitations and shortness of breath on exertion.  She had concerned she may have been back in A. fib.  She reports she has been taking Lasix 40 mg twice daily since hospital discharge rather than the recommended 40 mg daily.  Weight has been stable at home.  She continues to note mild bilateral pedal edema.  She denies any BRBPR, melena, or hematuria.  No falls.  She denies any chest pain, dizziness, presyncope, or syncope.  He has stable two-pillow orthopnea.  She is seeing GI later this afternoon for evaluation of her anemia and occult positive stool.  She has follow-up with nephrology on 9/2.  She is uncertain when her follow-up with urology is.  She does report her PCP checked a CBC on her last week though has not heard results.  These are not available in epic for review.   Labs: 09/04/2018 - INR 2.5 08/2018 - magnesium 2.7, potassium 4.6, serum creatinine 1.77, occult blood positive, WBC 12.5, Hgb 9.0 improved from a low of 7.9 status post 2 units of packed red blood cells, PLT 257, BNP 539, A1c 6.0, TSH normal 03/2018 - AST/ALT normal, albumin 4.2  Past Medical History:  Diagnosis Date   (HFpEF) heart failure with preserved ejection fraction ([place])    a. 08/2017 Echo: EF 55-60%.  Grade 2 diastolic dysfunction.  Moderate mitral stenosis.   Allergy    Anemia    Anxiety    BRCA negative 03/22/2013   Bronchitis 02/19/2016   ON LEVAQUIN PO   Chest tightness    Cigarette smoker 09/11/2017   8-10 day   GERD (gastroesophageal reflux disease)    History of kidney stones    Hyperlipidemia    Hypertension    Interstitial lung disease ([place])    a. CT 2013 b. 02/2018 CXR noted recurrent intersistial changes ILD vs chronic  bronchitis   Moderate mitral stenosis    a.  08/2017 TEE: EF 60 to 65%.  Moderate mitral stenosis.  Mean gradient 14 mmHg.  Valve area 2.59 cm by planimetry, 2.72 cm by pressure half-time.   Persistent atrial fibrillation    a. 08/2017 s/p TEE/DCCV; b. CHA2DS2VASc = 2-->warfarin.    Past Surgical History:  Procedure Laterality Date   ABDOMINAL HYSTERECTOMY     total   ABDOMINAL HYSTERECTOMY     BREAST BIOPSY Bilateral 2012   BREAST BIOPSY Right 08-07-12   fibroadenomatous changes and columnar cells   BREAST BIOPSY  02/03/2015   stereo byrnett   CHOLECYSTECTOMY N/A 02/27/2016   Procedure: LAPAROSCOPIC CHOLECYSTECTOMY;  Surgeon: Christene Lye, MD;  Location: [facility];  Service: General;  Laterality: N/A;   CYSTOSCOPY W/ RETROGRADES Right 08/18/2018   Procedure: CYSTOSCOPY WITH RETROGRADE PYELOGRAM;  Surgeon: Billey Co, MD;  Location: [facility];  Service: Urology;  Laterality: Right;   CYSTOSCOPY/URETEROSCOPY/HOLMIUM LASER/STENT PLACEMENT Right  08/18/2018   Procedure: CYSTOSCOPY/URETEROSCOPY/STENT PLACEMENT;  Surgeon: Billey Co, MD;  Location: [facility];  Service: Urology;  Laterality: Right;   JOINT REPLACEMENT Left    KNEE CLOSED REDUCTION Left 04/15/2015   Procedure: CLOSED MANIPULATION KNEE;  Surgeon: Thornton Park, MD;  Location: [facility];  Service: Orthopedics;  Laterality: Left;   KNEE SURGERY     TEE WITHOUT CARDIOVERSION N/A 09/13/2017   Procedure: TRANSESOPHAGEAL ECHOCARDIOGRAM (TEE);  Surgeon: Nelva Bush, MD;  Location: [facility];  Service: Cardiovascular;  Laterality: N/A;   TOTAL KNEE ARTHROPLASTY Left 12/25/2014   Procedure: TOTAL KNEE ARTHROPLASTY;  Surgeon: Thornton Park, MD;  Location: [facility];  Service: Orthopedics;  Laterality: Left;   TUBAL LIGATION      Current Medications: Current Meds  Medication Sig   albuterol (VENTOLIN HFA) 108 (90 Base) MCG/ACT inhaler Inhale 2 puffs into the lungs every 4 (four) hours as needed for  wheezing or shortness of breath.   ALPRAZolam (XANAX) 0.5 MG tablet Take 1 tablet (0.5 mg total) by mouth at bedtime as needed for anxiety.   atorvastatin (LIPITOR) 40 MG tablet Take 1 tablet (40 mg total) by mouth daily at 6 PM.   diltiazem (CARDIZEM CD) 240 MG 24 hr capsule TAKE 1 CAPSULE DAILY   estradiol (ESTRACE) 0.5 MG tablet Take 1 tablet (0.5 mg total) by mouth daily.   furosemide (LASIX) 40 MG tablet Take 1 tablet (40 mg total) by mouth daily.   metoprolol succinate (TOPROL-XL) 25 MG 24 hr tablet Take 1 tablet (25 mg total) by mouth daily. Take with or immediately following a meal.   montelukast (SINGULAIR) 10 MG tablet Take 1 tablet (10 mg total) by mouth at bedtime.   omeprazole (PRILOSEC) 40 MG capsule Take 1 capsule (40 mg total) by mouth daily.   potassium chloride (K-DUR) 10 MEQ tablet Take 2 tablets (20 meq) by mouth once daily (Patient taking differently: Take 20 mEq by mouth daily. )   warfarin (COUMADIN) 2.5 MG tablet TAKE 1/2 TO 1 TABLET DAILY AS DIRECTED BY COUMADIN CLINIC. (Patient taking differently: Take 1.25-2.5 mg by mouth See admin instructions. Take 2.5 mg at night on Mon, Wed, and Fri.  Take 1.25 mg at night on Sun, Tue, Thur, and Sat)    Allergies:   Morphine, Oxycodone hcl, and Dilaudid [hydromorphone hcl]   Social History   Socioeconomic History   Marital status: Married    Spouse name: Not on file   Number of children: Not on file   Years of education: Not on file   Highest education level: Not on file  Occupational History   Not on file  Social Needs   Financial resource strain: Not on file   Food insecurity    Worry: Not on file    Inability: Not on file   Transportation needs    Medical: Not on file    Non-medical: Not on file  Tobacco Use   Smoking status: Former Smoker    Packs/day: 0.50    Years: 20.00    Pack years: 10.00    Types: Cigarettes    Quit date: 02/20/2018    Years since quitting: 0.5   Smokeless tobacco:  Never Used   Tobacco comment: down to 2-3 daily  Substance and Sexual Activity   Alcohol use: Yes    Alcohol/week: 2.0 standard drinks    Types: 2 Glasses of wine per week    Comment: wine occ   Drug use: No   Sexual activity: Not  on file  Lifestyle   Physical activity    Days per week: Not on file    Minutes per session: Not on file   Stress: Not on file  Relationships   Social connections    Talks on phone: Not on file    Gets together: Not on file    Attends religious service: Not on file    Active member of club or organization: Not on file    Attends meetings of clubs or organizations: Not on file    Relationship status: Not on file  Other Topics Concern   Not on file  Social History Narrative   Not on file     Family History:  The patient's family history includes Cancer in her maternal aunt and maternal grandmother; Cancer (age of onset: 64) in her mother.  ROS:   Review of Systems  Constitutional: Positive for malaise/fatigue. Negative for chills, diaphoresis, fever and weight loss.  HENT: Negative for congestion.   Eyes: Negative for discharge and redness.  Respiratory: Positive for shortness of breath. Negative for cough, hemoptysis, sputum production and wheezing.   Cardiovascular: Positive for palpitations and leg swelling. Negative for chest pain, orthopnea, claudication and PND.  Gastrointestinal: Negative for abdominal pain, blood in stool, heartburn, melena, nausea and vomiting.  Genitourinary: Negative for hematuria.  Musculoskeletal: Negative for falls and myalgias.  Skin: Negative for rash.  Neurological: Positive for weakness. Negative for dizziness, tingling, tremors, sensory change, speech change, focal weakness and loss of consciousness.  Endo/Heme/Allergies: Does not bruise/bleed easily.  Psychiatric/Behavioral: Negative for substance abuse. The patient is not nervous/anxious.   All other systems reviewed and are  negative.    EKGs/Labs/Other Studies Reviewed:    Studies reviewed were summarized above. The additional studies were reviewed today:  2D Echo 08/29/2018: 1. The left ventricle has hyperdynamic systolic function, with an ejection fraction of >65%. The cavity size was normal. Left ventricular diastolic Doppler parameters are indeterminate.  2. The right ventricle has normal systolic function. The cavity was mildly enlarged. There is no increase in right ventricular wall thickness. Right ventricular systolic pressure could not be assessed.  3. Left atrial size was mildly dilated.  4. The mitral valve was not well visualized. Moderate thickening of the mitral valve leaflet. Moderate-severe mitral valve stenosis, with mean gradient of 16 mmHg and MVA by continuity equation of 1.0 cm^2. MVA by PHT calculated at 3.5 cm^2 but is  likely an overestimation.  5. The aortic valve was not well visualized.  6. The aorta is normal in size and structure.  7. The interatrial septum was not well visualized. __________  Elwyn Reach 02/2018: Normal sinus rhythm Avg HR of 82 bpm.  5 Supraventricular Tachycardia/atrial tachycardia runs occurred, the run with the fastest interval lasting 5 beats with a max rate of 145 bpm, the longest lasting 16 beats with an avg rate of 114 bpm.  Isolated SVEs were rare (<1.0%), SVE Couplets were rare (<1.0%), and SVE Triplets were rare (<1.0%). Isolated VEs were rare (<1.0%), and no VE Couplets or VE Triplets were present. Inverted QRS complexes possibly due to inverted placement of device. Patient triggered events were not associated with significant arrhythmia. __________  Coronary CTA 02/2018: IMPRESSION: 1. Coronary calcium score of 10. This was 65 percentile for age and sex matched control. 2. Normal coronary origin with right dominance. 3. Mild non-obstructive CAD. Aggressive risk factor modification is recommended. 4. Mild diffuse calcifications and atherosclerotic  plaque. 5. Dilated pulmonary artery measuring 35 mm  suggestive of pulmonary Hypertension.   EKG:  EKG is ordered today.  The EKG ordered today demonstrates A. fib, 88 bpm, nonspecific ST-T changes  Recent Labs: 04/07/2018: ALT 19 08/26/2018: B Natriuretic Peptide 539.0; TSH 0.864 09/01/2018: BUN 49; Creatinine, Ser 1.77; Magnesium 2.7; Potassium 4.6; Sodium 141 09/14/2018: Hemoglobin 10.7; Platelets 485  Recent Lipid Panel    Component Value Date/Time   CHOL 170 04/07/2018 0910   TRIG 136 04/07/2018 0910   HDL 34 (L) 04/07/2018 0910   CHOLHDL 5.0 (H) 04/07/2018 0910   LDLCALC 109 (H) 04/07/2018 0910    PHYSICAL EXAM:    VS:  BP (!) 144/89    Pulse 93    Temp 97.6 F (36.4 C)    Ht '5\' 4"'  (1.626 m)    Wt 251 lb 12.8 oz (114.2 kg)    BMI 43.22 kg/m   BMI: Body mass index is 43.22 kg/m.  Physical Exam  Constitutional: She is oriented to person, place, and time. She appears well-developed and well-nourished.  HENT:  Head: Normocephalic and atraumatic.  Eyes: Right eye exhibits no discharge. Left eye exhibits no discharge.  Neck: Normal range of motion. No JVD present.  Cardiovascular: Normal rate, S1 normal, S2 normal and normal heart sounds. An irregularly irregular rhythm present. Exam reveals no distant heart sounds, no friction rub, no midsystolic click and no opening snap.  No murmur heard. Pulses:      Dorsalis pedis pulses are 2+ on the right side and 2+ on the left side.       Posterior tibial pulses are 2+ on the right side and 2+ on the left side.  Pulmonary/Chest: Effort normal and breath sounds normal. No respiratory distress. She has no decreased breath sounds. She has no wheezes. She has no rales. She exhibits no tenderness.  Abdominal: Soft. She exhibits no distension. There is no abdominal tenderness.  Musculoskeletal:        General: Edema present.     Comments: Trace bilateral pedal edema  Neurological: She is alert and oriented to person, place, and time.  Skin:  Skin is warm and dry. No cyanosis. Nails show no clubbing.  Psychiatric: She has a normal mood and affect. Her speech is normal and behavior is normal. Judgment and thought content normal.    Wt Readings from Last 3 Encounters:  09/19/18 251 lb 12.8 oz (114.2 kg)  09/13/18 247 lb (112 kg)  08/31/18 262 lb 6.4 oz (119 kg)     ASSESSMENT & PLAN:   1. HFpEF: She appears euvolemic and well compensated.  Weight currently down 11 pounds from office visit in late 07/2018.  She does note some mild bilateral pedal edema which certainly may be in the setting of underlying obesity and calcium channel blocker usage with component of venous insufficiency.  Recommend she elevate legs and wear compression stockings.  She has been taking Lasix 40 mg twice daily rather than the discharge recommendation of daily.  In this setting, we will check a BMP to evaluate her renal function and potassium.  2. PAF/paroxysmal SVT: Patient is back in A. fib with controlled ventricular response.  She feels like she redeveloped A. fib approximately 3 to 4 days prior.  Increase Toprol-XL to 50 mg daily for added rate control.  Continue Cardizem CD 240 mg daily.  Continue Coumadin per Coumadin clinic.  INR 2.5 from 09/04/2018.  I discussed with the patient in detail we would need to have both her anemia and renal stone further  evaluated prior to committing her to uninterrupted anticoagulation following potential cardioversion.  I suspect that her underlying shortness of breath and palpitations are likely multifactorial including recent hospitalization, physical deconditioning, underlying anemia, and A. fib.  She denies any BRBPR, melena, or hematuria  3. Nonobstructive CAD: Recent coronary CTA demonstrating nonobstructive disease.  On Coumadin in place of aspirin.  Aggressive risk factor modification.  4. Anemia with occult positive stool: Status post 2 units packed red RBC during recent admission.  Check CBC to evaluate for stable  hemoglobin given patient is on Coumadin as above.  She is scheduled to see GI later this afternoon.  If endoscopic procedures are needed we will route the patient's chart to our pharmacy team for input regarding temporary suspension of anticoagulation.  Otherwise, no further cardiac testing would be needed prior to endoscopic procedure.  5. Mitral valve stenosis: Appeared stable on most recent echo.  She will need follow-up echo in 6 to 12 months.  6. Renal lithiasis: Recommend patient contact urology to discuss when follow-up is needed, currently scheduled for 02/2019.  7. CKD stage III: Follow-up with nephrology as directed.  8. Hypertension: Blood pressure is mildly elevated today as above.  Increase Toprol-XL to 50 mg daily as above.  Otherwise, she will continue current dose of Cardizem CD and Lasix, pending BMP.  9. Hyperlipidemia: LDL of 109 from 03/2018 with calcium score of 10 earlier this year.  Lipitor was increased to 40 mg daily at that time.  Repeat lipid and liver function today.  Disposition: F/u with Dr. Rockey Situ or an APP in 3 months, sooner if needed.   Medication Adjustments/Labs and Tests Ordered: Current medicines are reviewed at length with the patient today.  Concerns regarding medicines are outlined above. Medication changes, Labs and Tests ordered today are summarized above and listed in the Patient Instructions accessible in Encounters.   Signed, Christell Faith, PA-C 09/19/2018 8:21 AM     [facility] [address] [place], [place] [address] [phone]

## 2018-09-18 ENCOUNTER — Encounter: Payer: Self-pay | Admitting: Physician Assistant

## 2018-09-19 ENCOUNTER — Other Ambulatory Visit: Payer: Self-pay

## 2018-09-19 ENCOUNTER — Ambulatory Visit (INDEPENDENT_AMBULATORY_CARE_PROVIDER_SITE_OTHER): Payer: Managed Care, Other (non HMO) | Admitting: Physician Assistant

## 2018-09-19 ENCOUNTER — Encounter: Payer: Self-pay | Admitting: Nurse Practitioner

## 2018-09-19 ENCOUNTER — Encounter: Payer: Self-pay | Admitting: Gastroenterology

## 2018-09-19 ENCOUNTER — Ambulatory Visit: Payer: Managed Care, Other (non HMO) | Admitting: Gastroenterology

## 2018-09-19 ENCOUNTER — Encounter: Payer: Self-pay | Admitting: Physician Assistant

## 2018-09-19 ENCOUNTER — Encounter

## 2018-09-19 ENCOUNTER — Telehealth: Payer: Self-pay

## 2018-09-19 ENCOUNTER — Other Ambulatory Visit
Admission: EM | Admit: 2018-09-19 | Discharge: 2018-09-19 | Disposition: A | Discharge status: Home / Self Care | Payer: Managed Care, Other (non HMO) | Source: Ambulatory Visit | Attending: Physician Assistant | Admitting: Physician Assistant

## 2018-09-19 VITALS — BP 144/89 | HR 93 | Temp 97.6°F | Ht 64.0 in | Wt 251.8 lb

## 2018-09-19 VITALS — BP 139/88 | HR 87 | Temp 97.8°F | Ht 64.0 in | Wt 250.8 lb

## 2018-09-19 DIAGNOSIS — I2510 Atherosclerotic heart disease of native coronary artery without angina pectoris: Secondary | ICD-10-CM

## 2018-09-19 DIAGNOSIS — D500 Iron deficiency anemia secondary to blood loss (chronic): Secondary | ICD-10-CM

## 2018-09-19 DIAGNOSIS — K921 Melena: Secondary | ICD-10-CM

## 2018-09-19 DIAGNOSIS — I050 Rheumatic mitral stenosis: Secondary | ICD-10-CM

## 2018-09-19 DIAGNOSIS — R1084 Generalized abdominal pain: Secondary | ICD-10-CM

## 2018-09-19 DIAGNOSIS — D649 Anemia, unspecified: Secondary | ICD-10-CM

## 2018-09-19 DIAGNOSIS — I480 Paroxysmal atrial fibrillation: Secondary | ICD-10-CM

## 2018-09-19 DIAGNOSIS — E782 Mixed hyperlipidemia: Secondary | ICD-10-CM

## 2018-09-19 DIAGNOSIS — D509 Iron deficiency anemia, unspecified: Secondary | ICD-10-CM

## 2018-09-19 DIAGNOSIS — N183 Chronic kidney disease, stage 3 unspecified: Secondary | ICD-10-CM

## 2018-09-19 DIAGNOSIS — I471 Supraventricular tachycardia: Secondary | ICD-10-CM | POA: Diagnosis not present

## 2018-09-19 DIAGNOSIS — I5032 Chronic diastolic (congestive) heart failure: Secondary | ICD-10-CM | POA: Diagnosis not present

## 2018-09-19 DIAGNOSIS — E7800 Pure hypercholesterolemia, unspecified: Secondary | ICD-10-CM

## 2018-09-19 DIAGNOSIS — I10 Essential (primary) hypertension: Secondary | ICD-10-CM

## 2018-09-19 DIAGNOSIS — R195 Other fecal abnormalities: Secondary | ICD-10-CM

## 2018-09-19 DIAGNOSIS — N200 Calculus of kidney: Secondary | ICD-10-CM

## 2018-09-19 LAB — COMPREHENSIVE METABOLIC PANEL
ALT: 20 U/L (ref 0–44)
AST: 20 U/L (ref 15–41)
Albumin: 3.5 g/dL (ref 3.5–5.0)
Alkaline Phosphatase: 133 U/L — ABNORMAL HIGH (ref 38–126)
Anion gap: 11 (ref 5–15)
BUN: 21 mg/dL — ABNORMAL HIGH (ref 6–20)
CO2: 28 mmol/L (ref 22–32)
Calcium: 9 mg/dL (ref 8.9–10.3)
Chloride: 102 mmol/L (ref 98–111)
Creatinine, Ser: 1.7 mg/dL — ABNORMAL HIGH (ref 0.44–1.00)
GFR calc Af Amer: 39 mL/min — ABNORMAL LOW (ref >60)
GFR calc non Af Amer: 34 mL/min — ABNORMAL LOW (ref >60)
Glucose, Bld: 115 mg/dL — ABNORMAL HIGH (ref 70–99)
Potassium: 3.4 mmol/L — ABNORMAL LOW (ref 3.5–5.1)
Sodium: 141 mmol/L (ref 135–145)
Total Bilirubin: 0.5 mg/dL (ref 0.3–1.2)
Total Protein: 7.8 g/dL (ref 6.5–8.1)

## 2018-09-19 LAB — CBC
HCT: 34.2 % — ABNORMAL LOW (ref 36.0–46.0)
Hemoglobin: 10.9 g/dL — ABNORMAL LOW (ref 12.0–15.0)
MCH: 27.6 pg (ref 26.0–34.0)
MCHC: 31.9 g/dL (ref 30.0–36.0)
MCV: 86.6 fL (ref 80.0–100.0)
Platelets: 292 10*3/uL (ref 150–400)
RBC: 3.95 MIL/uL (ref 3.87–5.11)
RDW: 15.9 % — ABNORMAL HIGH (ref 11.5–15.5)
WBC: 6.6 10*3/uL (ref 4.0–10.5)
nRBC: 0 % (ref 0.0–0.2)

## 2018-09-19 LAB — LIPID PANEL
Cholesterol: 171 mg/dL (ref 0–200)
HDL: 33 mg/dL — ABNORMAL LOW (ref >40)
LDL Cholesterol: 101 mg/dL — ABNORMAL HIGH (ref 0–99)
Total CHOL/HDL Ratio: 5.2 RATIO
Triglycerides: 186 mg/dL — ABNORMAL HIGH (ref <150)
VLDL: 37 mg/dL (ref 0–40)

## 2018-09-19 MED ORDER — METOPROLOL SUCCINATE ER 50 MG PO TB24: 50.0000 mg | ORAL_TABLET | Freq: Every day | ORAL | 3 refills | Status: DC

## 2018-09-19 MED ORDER — SUPREP BOWEL PREP KIT 17.5-3.13-1.6 GM/177ML PO SOLN: 1.0000 | ORAL | 0 refills | Status: DC

## 2018-09-19 MED ORDER — ATORVASTATIN CALCIUM 80 MG PO TABS: 80.0000 mg | ORAL_TABLET | Freq: Every day | ORAL | 3 refills | Status: DC

## 2018-09-19 MED ORDER — FUROSEMIDE 40 MG PO TABS: 40.0000 mg | ORAL_TABLET | Freq: Two times a day (BID) | ORAL | 1 refills | Status: DC

## 2018-09-19 NOTE — Progress Notes (Signed)
Gastroenterology Consultation  Referring Provider:     Ronnell Freshwater, NP Primary Care Physician:  Ronnell Freshwater, NP Primary Gastroenterologist:  Dr. Allen Norris     Reason for Consultation:     Anemia with heme positive stools        HPI:   Shavawn Stobaugh is a 53 y.o. y/o female referred for consultation & management of anemia with heme positive stools by Dr. Ronnell Freshwater, NP.  This patient was recently in the hospital for pyelonephritis and sepsis.  At that time the patient was found to have heme positive stools and a hemoglobin as low as of 7.9 with hematocrit of 25.3.  The patient denies any bright red blood per rectum or black stools. The patient denies any unexplained weight loss.  She also denies a family history of colon cancer or colon polyps.  The patient has not had a colonoscopy in the past.  She was evaluated by cardiology for atrial fibrillation and anticoagulation was deferred until after GI bleed was ruled out due to her heme positive stools. The patient had blood work this morning that showed her hemoglobin at 10.9 and hematocrit at 34.2.  She also reports that she has kidney stones.  Past Medical History:  Diagnosis Date   (HFpEF) heart failure with preserved ejection fraction ([place])    a. 08/2017 Echo: EF 55-60%.  Grade 2 diastolic dysfunction.  Moderate mitral stenosis.   Allergy    Anemia    Anxiety    BRCA negative 03/22/2013   Bronchitis 02/19/2016   ON LEVAQUIN PO   Chest tightness    Cigarette smoker 09/11/2017   8-10 day   GERD (gastroesophageal reflux disease)    History of kidney stones    Hyperlipidemia    Hypertension    Interstitial lung disease ([place])    a. CT 2013 b. 02/2018 CXR noted recurrent intersistial changes ILD vs chronic bronchitis   Moderate mitral stenosis    a.  08/2017 TEE: EF 60 to 65%.  Moderate mitral stenosis.  Mean gradient 14 mmHg.  Valve area 2.59 cm by planimetry, 2.72 cm by pressure half-time.   Persistent  atrial fibrillation    a. 08/2017 s/p TEE/DCCV; b. CHA2DS2VASc = 2-->warfarin.    Past Surgical History:  Procedure Laterality Date   ABDOMINAL HYSTERECTOMY     total   ABDOMINAL HYSTERECTOMY     BREAST BIOPSY Bilateral 2012   BREAST BIOPSY Right 08-07-12   fibroadenomatous changes and columnar cells   BREAST BIOPSY  02/03/2015   stereo byrnett   CHOLECYSTECTOMY N/A 02/27/2016   Procedure: LAPAROSCOPIC CHOLECYSTECTOMY;  Surgeon: Christene Lye, MD;  Location: [facility];  Service: General;  Laterality: N/A;   CYSTOSCOPY W/ RETROGRADES Right 08/18/2018   Procedure: CYSTOSCOPY WITH RETROGRADE PYELOGRAM;  Surgeon: Billey Co, MD;  Location: [facility];  Service: Urology;  Laterality: Right;   CYSTOSCOPY/URETEROSCOPY/HOLMIUM LASER/STENT PLACEMENT Right 08/18/2018   Procedure: CYSTOSCOPY/URETEROSCOPY/STENT PLACEMENT;  Surgeon: Billey Co, MD;  Location: [facility];  Service: Urology;  Laterality: Right;   JOINT REPLACEMENT Left    KNEE CLOSED REDUCTION Left 04/15/2015   Procedure: CLOSED MANIPULATION KNEE;  Surgeon: Thornton Park, MD;  Location: [facility];  Service: Orthopedics;  Laterality: Left;   KNEE SURGERY     TEE WITHOUT CARDIOVERSION N/A 09/13/2017   Procedure: TRANSESOPHAGEAL ECHOCARDIOGRAM (TEE);  Surgeon: Nelva Bush, MD;  Location: [facility];  Service: Cardiovascular;  Laterality: N/A;   TOTAL KNEE ARTHROPLASTY Left 12/25/2014  Procedure: TOTAL KNEE ARTHROPLASTY;  Surgeon: Thornton Park, MD;  Location: [facility];  Service: Orthopedics;  Laterality: Left;   TUBAL LIGATION      Prior to Admission medications   Medication Sig Start Date End Date Taking? Authorizing Provider  albuterol (VENTOLIN HFA) 108 (90 Base) MCG/ACT inhaler Inhale 2 puffs into the lungs every 4 (four) hours as needed for wheezing or shortness of breath. 06/16/18  Yes Boscia, Greer Ee, NP  ALPRAZolam (XANAX) 0.5 MG tablet Take 1 tablet (0.5 mg total) by mouth at bedtime as needed for  anxiety. 06/19/18  Yes Ronnell Freshwater, NP  atorvastatin (LIPITOR) 80 MG tablet Take 1 tablet (80 mg total) by mouth daily at 6 PM. 09/19/18  Yes Dunn, Areta Haber, PA-C  diltiazem (CARDIZEM CD) 240 MG 24 hr capsule TAKE 1 CAPSULE DAILY 08/28/18  Yes Gollan, Kathlene November, MD  estradiol (ESTRACE) 0.5 MG tablet Take 1 tablet (0.5 mg total) by mouth daily. 08/04/18  Yes Boscia, Greer Ee, NP  furosemide (LASIX) 40 MG tablet Take 1 tablet (40 mg total) by mouth 2 (two) times daily. 09/19/18  Yes Dunn, Areta Haber, PA-C  metoprolol succinate (TOPROL-XL) 50 MG 24 hr tablet Take 1 tablet (50 mg total) by mouth daily. Take with or immediately following a meal. 09/19/18  Yes Dunn, Ryan M, PA-C  montelukast (SINGULAIR) 10 MG tablet Take 1 tablet (10 mg total) by mouth at bedtime. 04/18/18  Yes Boscia, Greer Ee, NP  omeprazole (PRILOSEC) 40 MG capsule Take 1 capsule (40 mg total) by mouth daily. 06/22/18  Yes Boscia, Greer Ee, NP  potassium chloride (K-DUR) 10 MEQ tablet Take 2 tablets (20 meq) by mouth once daily Patient taking differently: Take 20 mEq by mouth daily.  04/10/18  Yes Gollan, Kathlene November, MD  warfarin (COUMADIN) 2.5 MG tablet TAKE 1/2 TO 1 TABLET DAILY AS DIRECTED BY COUMADIN CLINIC. Patient taking differently: Take 1.25-2.5 mg by mouth See admin instructions. Take 2.5 mg at night on Mon, Wed, and Fri.  Take 1.25 mg at night on Sun, Tue, Thur, and Sat 04/07/18  Yes Gollan, Kathlene November, MD  Na Sulfate-K Sulfate-Mg Sulf (SUPREP BOWEL PREP KIT) 17.5-3.13-1.6 GM/177ML SOLN Take 1 kit by mouth as directed. 09/19/18   Lucilla Lame, MD    Family History  Problem Relation Age of Onset   Cancer Mother 68       breast   Cancer Maternal Aunt        breast   Cancer Maternal Grandmother        breast     Social History   Tobacco Use   Smoking status: Former Smoker    Packs/day: 0.50    Years: 20.00    Pack years: 10.00    Types: Cigarettes    Quit date: 02/20/2018    Years since quitting: 0.5   Smokeless tobacco:  Never Used   Tobacco comment: down to 2-3 daily  Substance Use Topics   Alcohol use: Yes    Alcohol/week: 2.0 standard drinks    Types: 2 Glasses of wine per week    Comment: wine occ   Drug use: No    Allergies as of 09/19/2018 - Review Complete 09/19/2018  Allergen Reaction Noted   Morphine Hives and Rash 06/06/2012   Oxycodone hcl Hives and Itching 12/26/2014   Dilaudid [hydromorphone hcl] Itching 12/26/2014    Review of Systems:    All systems reviewed and negative except where noted in HPI.   Physical Exam:  BP 139/88    Pulse 87    Temp 97.8 F (36.6 C) (Oral)    Ht _0  (1.626 m)    Wt 250 lb 12.8 oz (113.8 kg)    BMI 43.05 kg/m  No LMP recorded. Patient has had a hysterectomy. General:   Alert,  Well-developed, well-nourished, pleasant and cooperative in NAD Head:  Normocephalic and atraumatic. Eyes:  Sclera clear, no icterus.   Conjunctiva pink. Ears:  Normal auditory acuity. Nose:  No deformity, discharge, or lesions. Mouth:  No deformity or lesions,oropharynx pink & moist. Neck:  Supple; no masses or thyromegaly. Lungs:  Respirations even and unlabored.  Clear throughout to auscultation.   No wheezes, crackles, or rhonchi. No acute distress. Heart:  Regular rate and rhythm; no murmurs, clicks, rubs, or gallops. Abdomen:  Normal bowel sounds.  No bruits.  Soft, non-tender and non-distended without masses, hepatosplenomegaly or hernias noted.  No guarding or rebound tenderness.  Negative Carnett sign.   Rectal:  Deferred.  Msk:  Symmetrical without gross deformities.  Good, equal movement & strength bilaterally. Pulses:  Normal pulses noted. Extremities:  No clubbing or edema.  No cyanosis. Neurologic:  Alert and oriented x3;  grossly normal neurologically. Skin:  Intact without significant lesions or rashes.  No jaundice. Lymph Nodes:  No significant cervical adenopathy. Psych:  Alert and cooperative. Normal mood and affect.  Imaging Studies: Dg Chest 2  View  Result Date: 08/26/2018 CLINICAL DATA:  Former smoker. Shortness of breath. History of interstitial lung disease. EXAM: CHEST - 2 VIEW COMPARISON:  March 16, 2018 FINDINGS: No pneumothorax. Increasing bilateral pulmonary opacities, particularly in the bases, worsened in the interval. The heart size borderline. The hila and mediastinum are unremarkable. IMPRESSION: The findings may represent atypical infection versus pulmonary edema. Recommend clinical correlation. Electronically Signed   By: Dorise Bullion III M.D   On: 08/26/2018 15:51   Ct Chest Wo Contrast  Result Date: 08/26/2018 CLINICAL DATA:  Shortness of breath EXAM: CT CHEST WITHOUT CONTRAST TECHNIQUE: Multidetector CT imaging of the chest was performed following the standard protocol without IV contrast. COMPARISON:  None. FINDINGS: Cardiovascular: The heart size is mildly enlarged. Aortic calcifications are noted. The main pulmonary artery is dilated measuring up to approximately 3.3 cm in diameter. There is no significant pericardial effusion. Mediastinum/Nodes: --there prominent mediastinal and hilar lymph nodes bilaterally. --No axillary lymphadenopathy. --No supraclavicular lymphadenopathy. --Normal thyroid gland. --The esophagus is unremarkable Lungs/Pleura: There are small bilateral pleural effusions. The lung volumes are low. There is a mosaic appearance of the lung fields bilaterally with scattered areas of ground-glass opacification. There are more focal opacities at the lung bases favored to represent atelectasis. There is interlobular septal thickening. There is no pneumothorax. Upper Abdomen: No acute abnormality. Musculoskeletal: No chest wall abnormality. No acute or significant osseous findings. Review of the MIP images confirms the above findings. IMPRESSION: 1. Overall findings concerning for developing pulmonary edema. An atypical infectious process is not entirely excluded and should be correlated clinically. 2. Small  bilateral pleural effusions. 3. Mediastinal and hilar adenopathy, presumably reactive in etiology. 4. Low lung volumes. 5. Dilated main pulmonary artery which can be seen in patients with elevated PA pressures. Aortic Atherosclerosis (ICD10-I70.0). Electronically Signed   By: Constance Holster M.D.   On: 08/26/2018 18:55   US Renal  Result Date: 08/28/2018 CLINICAL DATA:  Acute renal failure. EXAM: RENAL / URINARY TRACT ULTRASOUND COMPLETE COMPARISON:  Abdominal CT 08/26/2018. FINDINGS: Right Kidney: Renal measurements: 12.2 x 6.2  x 6.5 cm = volume: 257 mL. There is increased cortical echogenicity. There is a cyst in the upper pole measuring 2.8 cm maximally. A shadowing calculus is also present in the upper pole. No hydronephrosis. Left Kidney: Renal measurements: 9.8 x 7.5 x 6.3 cm = volume: 241 mL. Increased cortical echogenicity with preserved cortical thickness. No hydronephrosis. Bladder: Appears normal for degree of bladder distention. IMPRESSION: 1. Both kidneys demonstrate increased cortical echogenicity as can be seen with medical renal disease. 2. No hydronephrosis.  Right renal cyst and calculus again noted. Electronically Signed   By: Richardean Sale M.D.   On: 08/28/2018 11:30   Ct Renal Stone Study  Result Date: 08/26/2018 CLINICAL DATA:  Flank pain question recurrent stone disease EXAM: CT ABDOMEN AND PELVIS WITHOUT CONTRAST TECHNIQUE: Multidetector CT imaging of the abdomen and pelvis was performed following the standard protocol without IV contrast. Sagittal and coronal MPR images reconstructed from axial data set. Oral contrast not administered for this indication COMPARISON:  07/18/2018 FINDINGS: Lower chest: Small BILATERAL pleural effusions and mild bibasilar atelectasis Hepatobiliary: Gallbladder surgically absent.  Liver unremarkable. Pancreas: Normal appearance Spleen: Normal appearance Adrenals/Urinary Tract: Adrenal glands normal appearance. LEFT kidney normal appearance. RIGHT  kidney enlarged with diffuse perinephric edema. Mild dilatation of RIGHT renal collecting system and RIGHT ureter. Bladder unremarkable. No ureteral calcification identified. Again seen 8 mm calculus at mid RIGHT kidney, nonobstructing. Again seen cyst at upper pole of RIGHT kidney, 3.0 x 2.9 cm, stable. Stomach/Bowel: Normal appendix. Stomach and bowel loops normal appearance Vascular/Lymphatic: Atherosclerotic calcifications aorta and iliac arteries without aneurysm. No adenopathy. Reproductive: Uterus surgically absent with nonvisualization of ovaries Other: No free air or free fluid.  No hernia. Musculoskeletal: No acute osseous findings. IMPRESSION: Enlargement of RIGHT kidney and perinephric edema with dilatation of RIGHT renal collecting system and RIGHT ureter. No obstructing ureteral calculus identified. Findings are most suspicious for a passed calculus though infection/pyelonephritis not completely excluded; recommend correlation with urinalysis. Electronically Signed   By: Lavonia Dana M.D.   On: 08/26/2018 15:35    Assessment and Plan:   Riyanna Crutchley is a 53 y.o. y/o female who was recently in the hospital for pyelonephritis and was found to have anemia with new positive stools.  The patient has been told that due to her heme positive stools, anemia and the need for anticoagulation, she should be set up for an EGD and colonoscopy to rule out any source of GI bleeding.  The patient will also have a blood test done for H. pylori and a celiac sprue panel at these are other causes of anemia.  I have discussed risks & benefits which include, but are not limited to, bleeding, infection, perforation & drug reaction.  The patient agrees with this plan & written consent will be obtained.    Lucilla Lame, MD. Marval Regal    Note: This dictation was prepared with Dragon dictation along with smaller phrase technology. Any transcriptional errors that result from this process are unintentional.

## 2018-09-19 NOTE — Patient Instructions (Signed)
Medication Instructions:  1- INCREASE Toprol to 1 tablet (50 mg total) once daily.  If you need a refill on your cardiac medications before your next appointment, please call your pharmacy.   Lab work: Your physician recommends that you have lab work today or next available. You will need to be fasting. You may take requisitions to the lab corp of your choosing.   If you have labs (blood work) drawn today and your tests are completely normal, you will receive your results only by: Marland Kitchen MyChart Message (if you have MyChart) OR . A paper copy in the mail If you have any lab test that is abnormal or we need to change your treatment, we will call you to review the results.  Testing/Procedures: None ordered   Follow-Up: At [facility], you and your health needs are our priority.  As part of our continuing mission to provide you with exceptional heart care, we have created designated Provider Care Teams.  These Care Teams include your primary Cardiologist (physician) and Advanced Practice Providers (APPs -  Physician Assistants and Nurse Practitioners) who all work together to provide you with the care you need, when you need it. You will need a follow up appointment in 3 months.  Please call our office 2 months in advance to schedule this appointment.  You may see Ida Rogue, MD or Christell Faith, PA-C.

## 2018-09-19 NOTE — H&P (View-Only) (Signed)
Gastroenterology Consultation  Referring Provider:     Ronnell Freshwater, NP Primary Care Physician:  Ronnell Freshwater, NP Primary Gastroenterologist:  Dr. Allen Norris     Reason for Consultation:     Anemia with heme positive stools        HPI:   Leslie [name] is a 53 y.o. y/o female referred for consultation & management of anemia with heme positive stools by Dr. Ronnell Freshwater, NP.  This patient was recently in the hospital for pyelonephritis and sepsis.  At that time the patient was found to have heme positive stools and a hemoglobin as low as of 7.9 with hematocrit of 25.3.  The patient denies any bright red blood per rectum or black stools. The patient denies any unexplained weight loss.  She also denies a family history of colon cancer or colon polyps.  The patient has not had a colonoscopy in the past.  She was evaluated by cardiology for atrial fibrillation and anticoagulation was deferred until after GI bleed was ruled out due to her heme positive stools. The patient had blood work this morning that showed her hemoglobin at 10.9 and hematocrit at 34.2.  She also reports that she has kidney stones.  Past Medical History:  Diagnosis Date   (HFpEF) heart failure with preserved ejection fraction ([place])    a. 08/2017 Echo: EF 55-60%.  Grade 2 diastolic dysfunction.  Moderate mitral stenosis.   Allergy    Anemia    Anxiety    BRCA negative 03/22/2013   Bronchitis 02/19/2016   ON LEVAQUIN PO   Chest tightness    Cigarette smoker 09/11/2017   8-10 day   GERD (gastroesophageal reflux disease)    History of kidney stones    Hyperlipidemia    Hypertension    Interstitial lung disease ([place])    a. CT 2013 b. 02/2018 CXR noted recurrent intersistial changes ILD vs chronic bronchitis   Moderate mitral stenosis    a.  08/2017 TEE: EF 60 to 65%.  Moderate mitral stenosis.  Mean gradient 14 mmHg.  Valve area 2.59 cm by planimetry, 2.72 cm by pressure half-time.   Persistent  atrial fibrillation    a. 08/2017 s/p TEE/DCCV; b. CHA2DS2VASc = 2-->warfarin.    Past Surgical History:  Procedure Laterality Date   ABDOMINAL HYSTERECTOMY     total   ABDOMINAL HYSTERECTOMY     BREAST BIOPSY Bilateral 2012   BREAST BIOPSY Right 08-07-12   fibroadenomatous changes and columnar cells   BREAST BIOPSY  02/03/2015   stereo byrnett   CHOLECYSTECTOMY N/A 02/27/2016   Procedure: LAPAROSCOPIC CHOLECYSTECTOMY;  Surgeon: Christene Lye, MD;  Location: [facility];  Service: General;  Laterality: N/A;   CYSTOSCOPY W/ RETROGRADES Right 08/18/2018   Procedure: CYSTOSCOPY WITH RETROGRADE PYELOGRAM;  Surgeon: Billey Co, MD;  Location: [facility];  Service: Urology;  Laterality: Right;   CYSTOSCOPY/URETEROSCOPY/HOLMIUM LASER/STENT PLACEMENT Right 08/18/2018   Procedure: CYSTOSCOPY/URETEROSCOPY/STENT PLACEMENT;  Surgeon: Billey Co, MD;  Location: [facility];  Service: Urology;  Laterality: Right;   JOINT REPLACEMENT Left    KNEE CLOSED REDUCTION Left 04/15/2015   Procedure: CLOSED MANIPULATION KNEE;  Surgeon: Thornton Park, MD;  Location: [facility];  Service: Orthopedics;  Laterality: Left;   KNEE SURGERY     TEE WITHOUT CARDIOVERSION N/A 09/13/2017   Procedure: TRANSESOPHAGEAL ECHOCARDIOGRAM (TEE);  Surgeon: Nelva Bush, MD;  Location: [facility];  Service: Cardiovascular;  Laterality: N/A;   TOTAL KNEE ARTHROPLASTY Left 12/25/2014  Procedure: TOTAL KNEE ARTHROPLASTY;  Surgeon: Thornton Park, MD;  Location: [facility];  Service: Orthopedics;  Laterality: Left;   TUBAL LIGATION      Prior to Admission medications   Medication Sig Start Date End Date Taking? Authorizing Provider  albuterol (VENTOLIN HFA) 108 (90 Base) MCG/ACT inhaler Inhale 2 puffs into the lungs every 4 (four) hours as needed for wheezing or shortness of breath. 06/16/18  Yes Boscia, Greer Ee, NP  ALPRAZolam (XANAX) 0.5 MG tablet Take 1 tablet (0.5 mg total) by mouth at bedtime as needed for  anxiety. 06/19/18  Yes Ronnell Freshwater, NP  atorvastatin (LIPITOR) 80 MG tablet Take 1 tablet (80 mg total) by mouth daily at 6 PM. 09/19/18  Yes Dunn, Areta Haber, PA-C  diltiazem (CARDIZEM CD) 240 MG 24 hr capsule TAKE 1 CAPSULE DAILY 08/28/18  Yes Gollan, Kathlene November, MD  estradiol (ESTRACE) 0.5 MG tablet Take 1 tablet (0.5 mg total) by mouth daily. 08/04/18  Yes Boscia, Greer Ee, NP  furosemide (LASIX) 40 MG tablet Take 1 tablet (40 mg total) by mouth 2 (two) times daily. 09/19/18  Yes Dunn, Areta Haber, PA-C  metoprolol succinate (TOPROL-XL) 50 MG 24 hr tablet Take 1 tablet (50 mg total) by mouth daily. Take with or immediately following a meal. 09/19/18  Yes Dunn, Ryan M, PA-C  montelukast (SINGULAIR) 10 MG tablet Take 1 tablet (10 mg total) by mouth at bedtime. 04/18/18  Yes Boscia, Greer Ee, NP  omeprazole (PRILOSEC) 40 MG capsule Take 1 capsule (40 mg total) by mouth daily. 06/22/18  Yes Boscia, Greer Ee, NP  potassium chloride (K-DUR) 10 MEQ tablet Take 2 tablets (20 meq) by mouth once daily Patient taking differently: Take 20 mEq by mouth daily.  04/10/18  Yes Gollan, Kathlene November, MD  warfarin (COUMADIN) 2.5 MG tablet TAKE 1/2 TO 1 TABLET DAILY AS DIRECTED BY COUMADIN CLINIC. Patient taking differently: Take 1.25-2.5 mg by mouth See admin instructions. Take 2.5 mg at night on Mon, Wed, and Fri.  Take 1.25 mg at night on Sun, Tue, Thur, and Sat 04/07/18  Yes Gollan, Kathlene November, MD  Na Sulfate-K Sulfate-Mg Sulf (SUPREP BOWEL PREP KIT) 17.5-3.13-1.6 GM/177ML SOLN Take 1 kit by mouth as directed. 09/19/18   Lucilla Lame, MD    Family History  Problem Relation Age of Onset   Cancer Mother 68       breast   Cancer Maternal Aunt        breast   Cancer Maternal Grandmother        breast     Social History   Tobacco Use   Smoking status: Former Smoker    Packs/day: 0.50    Years: 20.00    Pack years: 10.00    Types: Cigarettes    Quit date: 02/20/2018    Years since quitting: 0.5   Smokeless tobacco:  Never Used   Tobacco comment: down to 2-3 daily  Substance Use Topics   Alcohol use: Yes    Alcohol/week: 2.0 standard drinks    Types: 2 Glasses of wine per week    Comment: wine occ   Drug use: No    Allergies as of 09/19/2018 - Review Complete 09/19/2018  Allergen Reaction Noted   Morphine Hives and Rash 06/06/2012   Oxycodone hcl Hives and Itching 12/26/2014   Dilaudid [hydromorphone hcl] Itching 12/26/2014    Review of Systems:    All systems reviewed and negative except where noted in HPI.   Physical Exam:  BP 139/88    Pulse 87    Temp 97.8 F (36.6 C) (Oral)    Ht _0  (1.626 m)    Wt 250 lb 12.8 oz (113.8 kg)    BMI 43.05 kg/m  No LMP recorded. Patient has had a hysterectomy. General:   Alert,  Well-developed, well-nourished, pleasant and cooperative in NAD Head:  Normocephalic and atraumatic. Eyes:  Sclera clear, no icterus.   Conjunctiva pink. Ears:  Normal auditory acuity. Nose:  No deformity, discharge, or lesions. Mouth:  No deformity or lesions,oropharynx pink & moist. Neck:  Supple; no masses or thyromegaly. Lungs:  Respirations even and unlabored.  Clear throughout to auscultation.   No wheezes, crackles, or rhonchi. No acute distress. Heart:  Regular rate and rhythm; no murmurs, clicks, rubs, or gallops. Abdomen:  Normal bowel sounds.  No bruits.  Soft, non-tender and non-distended without masses, hepatosplenomegaly or hernias noted.  No guarding or rebound tenderness.  Negative Carnett sign.   Rectal:  Deferred.  Msk:  Symmetrical without gross deformities.  Good, equal movement & strength bilaterally. Pulses:  Normal pulses noted. Extremities:  No clubbing or edema.  No cyanosis. Neurologic:  Alert and oriented x3;  grossly normal neurologically. Skin:  Intact without significant lesions or rashes.  No jaundice. Lymph Nodes:  No significant cervical adenopathy. Psych:  Alert and cooperative. Normal mood and affect.  Imaging Studies: Dg Chest 2  View  Result Date: 08/26/2018 CLINICAL DATA:  Former smoker. Shortness of breath. History of interstitial lung disease. EXAM: CHEST - 2 VIEW COMPARISON:  March 16, 2018 FINDINGS: No pneumothorax. Increasing bilateral pulmonary opacities, particularly in the bases, worsened in the interval. The heart size borderline. The hila and mediastinum are unremarkable. IMPRESSION: The findings may represent atypical infection versus pulmonary edema. Recommend clinical correlation. Electronically Signed   By: Dorise Bullion III M.D   On: 08/26/2018 15:51   Ct Chest Wo Contrast  Result Date: 08/26/2018 CLINICAL DATA:  Shortness of breath EXAM: CT CHEST WITHOUT CONTRAST TECHNIQUE: Multidetector CT imaging of the chest was performed following the standard protocol without IV contrast. COMPARISON:  None. FINDINGS: Cardiovascular: The heart size is mildly enlarged. Aortic calcifications are noted. The main pulmonary artery is dilated measuring up to approximately 3.3 cm in diameter. There is no significant pericardial effusion. Mediastinum/Nodes: --there prominent mediastinal and hilar lymph nodes bilaterally. --No axillary lymphadenopathy. --No supraclavicular lymphadenopathy. --Normal thyroid gland. --The esophagus is unremarkable Lungs/Pleura: There are small bilateral pleural effusions. The lung volumes are low. There is a mosaic appearance of the lung fields bilaterally with scattered areas of ground-glass opacification. There are more focal opacities at the lung bases favored to represent atelectasis. There is interlobular septal thickening. There is no pneumothorax. Upper Abdomen: No acute abnormality. Musculoskeletal: No chest wall abnormality. No acute or significant osseous findings. Review of the MIP images confirms the above findings. IMPRESSION: 1. Overall findings concerning for developing pulmonary edema. An atypical infectious process is not entirely excluded and should be correlated clinically. 2. Small  bilateral pleural effusions. 3. Mediastinal and hilar adenopathy, presumably reactive in etiology. 4. Low lung volumes. 5. Dilated main pulmonary artery which can be seen in patients with elevated PA pressures. Aortic Atherosclerosis (ICD10-I70.0). Electronically Signed   By: Constance Holster M.D.   On: 08/26/2018 18:55   US Renal  Result Date: 08/28/2018 CLINICAL DATA:  Acute renal failure. EXAM: RENAL / URINARY TRACT ULTRASOUND COMPLETE COMPARISON:  Abdominal CT 08/26/2018. FINDINGS: Right Kidney: Renal measurements: 12.2 x 6.2  x 6.5 cm = volume: 257 mL. There is increased cortical echogenicity. There is a cyst in the upper pole measuring 2.8 cm maximally. A shadowing calculus is also present in the upper pole. No hydronephrosis. Left Kidney: Renal measurements: 9.8 x 7.5 x 6.3 cm = volume: 241 mL. Increased cortical echogenicity with preserved cortical thickness. No hydronephrosis. Bladder: Appears normal for degree of bladder distention. IMPRESSION: 1. Both kidneys demonstrate increased cortical echogenicity as can be seen with medical renal disease. 2. No hydronephrosis.  Right renal cyst and calculus again noted. Electronically Signed   By: Richardean Sale M.D.   On: 08/28/2018 11:30   Ct Renal Stone Study  Result Date: 08/26/2018 CLINICAL DATA:  Flank pain question recurrent stone disease EXAM: CT ABDOMEN AND PELVIS WITHOUT CONTRAST TECHNIQUE: Multidetector CT imaging of the abdomen and pelvis was performed following the standard protocol without IV contrast. Sagittal and coronal MPR images reconstructed from axial data set. Oral contrast not administered for this indication COMPARISON:  07/18/2018 FINDINGS: Lower chest: Small BILATERAL pleural effusions and mild bibasilar atelectasis Hepatobiliary: Gallbladder surgically absent.  Liver unremarkable. Pancreas: Normal appearance Spleen: Normal appearance Adrenals/Urinary Tract: Adrenal glands normal appearance. LEFT kidney normal appearance. RIGHT  kidney enlarged with diffuse perinephric edema. Mild dilatation of RIGHT renal collecting system and RIGHT ureter. Bladder unremarkable. No ureteral calcification identified. Again seen 8 mm calculus at mid RIGHT kidney, nonobstructing. Again seen cyst at upper pole of RIGHT kidney, 3.0 x 2.9 cm, stable. Stomach/Bowel: Normal appendix. Stomach and bowel loops normal appearance Vascular/Lymphatic: Atherosclerotic calcifications aorta and iliac arteries without aneurysm. No adenopathy. Reproductive: Uterus surgically absent with nonvisualization of ovaries Other: No free air or free fluid.  No hernia. Musculoskeletal: No acute osseous findings. IMPRESSION: Enlargement of RIGHT kidney and perinephric edema with dilatation of RIGHT renal collecting system and RIGHT ureter. No obstructing ureteral calculus identified. Findings are most suspicious for a passed calculus though infection/pyelonephritis not completely excluded; recommend correlation with urinalysis. Electronically Signed   By: Lavonia Dana M.D.   On: 08/26/2018 15:35    Assessment and Plan:   Riyanna Crutchley is a 53 y.o. y/o female who was recently in the hospital for pyelonephritis and was found to have anemia with new positive stools.  The patient has been told that due to her heme positive stools, anemia and the need for anticoagulation, she should be set up for an EGD and colonoscopy to rule out any source of GI bleeding.  The patient will also have a blood test done for H. pylori and a celiac sprue panel at these are other causes of anemia.  I have discussed risks & benefits which include, but are not limited to, bleeding, infection, perforation & drug reaction.  The patient agrees with this plan & written consent will be obtained.    Lucilla Lame, MD. Marval Regal    Note: This dictation was prepared with Dragon dictation along with smaller phrase technology. Any transcriptional errors that result from this process are unintentional.

## 2018-09-19 NOTE — Telephone Encounter (Signed)
Call to patient to discuss lab results. Pt verbalized understanding and agreeable to POC.   Order placed for lab 8 weeks and lipitor increase.   Requisition mailed to patient as she prefers [employer] where she works.   Advised pt to call for any further questions or concerns.

## 2018-09-19 NOTE — Telephone Encounter (Signed)
-----   Message from Rise Mu, PA-C sent at 09/19/2018 11:03 AM EDT ----- Renal function remains elevated though stable at 1.7. Potassium is slightly below goal at 3.4. Isolated liver function test is mildly elevated, which is not new. LDL entheses bad cholesterol) is slightly improved from 10 9-1 01. Hemoglobin continues to improve and is now 10.9.  Recommendations: Given patient was documented to be in A. fib at her visit today, I would like for her to take an extra 20 mEq of KCl x1 followed by resumption of 20 mEq daily thereafter given her ongoing Lasix use of 40 mg twice daily. Given stable renal function she can continue Lasix 40 mg twice daily. LDL remains above goal.  Recommend increasing Lipitor to 80 mg daily.  Follow-up fasting lipid panel and liver function in 8 weeks. When patient sees PCP at next visit she can bring up her chronically elevated alkaline phosphatase to them. Reassuring that her blood count is improving.

## 2018-09-20 ENCOUNTER — Telehealth: Payer: Self-pay

## 2018-09-20 NOTE — Telephone Encounter (Signed)
Patient advised disability paper is ready for pick up, and put copy in scan. Leslie [name]

## 2018-09-21 ENCOUNTER — Encounter: Payer: Self-pay | Admitting: Nurse Practitioner

## 2018-09-21 LAB — CELIAC DISEASE PANEL
Endomysial IgA: NEGATIVE
IgA/Immunoglobulin A, Serum: 436 mg/dL — ABNORMAL HIGH (ref 87–352)
Transglutaminase IgA: <2 U/mL (ref 0–3)

## 2018-09-21 LAB — H. PYLORI ANTIBODY, IGG: H. pylori, IgG AbS: 1.2 Index Value — ABNORMAL HIGH (ref 0.00–0.79)

## 2018-09-22 ENCOUNTER — Ambulatory Visit (INDEPENDENT_AMBULATORY_CARE_PROVIDER_SITE_OTHER): Payer: Managed Care, Other (non HMO) | Admitting: Nurse Practitioner

## 2018-09-22 ENCOUNTER — Encounter: Payer: Self-pay | Admitting: Nurse Practitioner

## 2018-09-22 ENCOUNTER — Encounter: Payer: Self-pay | Admitting: *Deleted

## 2018-09-22 ENCOUNTER — Other Ambulatory Visit: Payer: Self-pay

## 2018-09-22 ENCOUNTER — Other Ambulatory Visit
Admission: RE | Admit: 2018-09-22 | Discharge: 2018-09-22 | Disposition: A | Discharge status: Home / Self Care | Payer: Managed Care, Other (non HMO) | Source: Ambulatory Visit | Attending: Gastroenterology | Admitting: Gastroenterology

## 2018-09-22 VITALS — BP 138/74 | HR 98 | Resp 16 | Ht 64.0 in | Wt 256.0 lb

## 2018-09-22 DIAGNOSIS — R300 Dysuria: Secondary | ICD-10-CM

## 2018-09-22 DIAGNOSIS — D531 Other megaloblastic anemias, not elsewhere classified: Secondary | ICD-10-CM

## 2018-09-22 DIAGNOSIS — Z01818 Encounter for other preprocedural examination: Secondary | ICD-10-CM

## 2018-09-22 DIAGNOSIS — Z20828 Contact with and (suspected) exposure to other viral communicable diseases: Secondary | ICD-10-CM | POA: Diagnosis not present

## 2018-09-22 DIAGNOSIS — N200 Calculus of kidney: Secondary | ICD-10-CM | POA: Diagnosis not present

## 2018-09-22 DIAGNOSIS — N12 Tubulo-interstitial nephritis, not specified as acute or chronic: Secondary | ICD-10-CM | POA: Diagnosis not present

## 2018-09-22 DIAGNOSIS — Z01812 Encounter for preprocedural laboratory examination: Secondary | ICD-10-CM | POA: Insufficient documentation

## 2018-09-22 DIAGNOSIS — I480 Paroxysmal atrial fibrillation: Secondary | ICD-10-CM

## 2018-09-22 LAB — SARS CORONAVIRUS 2 (TAT 6-24 HRS): SARS Coronavirus 2: NEGATIVE

## 2018-09-22 NOTE — Progress Notes (Signed)
[facility], [place] [address]  Internal MEDICINE  Office Visit Note  Patient Name: Leslie [name]  [id number]  [id number]  Date of Service: 10/01/2018   Pt is here for routine health maintenance examination  Chief Complaint  Patient presents with  . Medical Management of Chronic Issues  . Annual Exam    medicare wellness visit   . Hyperlipidemia  . Hypertension  . Gastroesophageal Reflux     The patient is here for annual wellness visit. She was recently hospitalized after developing urosepsis after having renal calculus removed. After the procedure, she was discharged home with ureteral stent. Was given instructions to remove it at home. She and her husband removed it, but she developed significant infection after the removal of the tube. During this illness, she developed severe anemia. She received two units of packed RBCs in the hospital and aemia improved throughout the hospitalization. She was discharged home on oral keflex. She continues to see cardiology due to chronic atrial fibrillation and mitral valve stenosis. She is seeing a nephrologist, and GI provider. She is scheduled to have a colonoscopy to find possible source of bleeding. Today, she states she continues to have swelling in both feet and ankles. She denies chest pain or chest pressure. She does have some continued shortness of breath with exertion. She denies abdominal pain, nausea, vomiting, diarrhea, or constipation at this time. She states that she is fatigued.     Current Medication: Outpatient Encounter Medications as of 09/22/2018  Medication Sig  . albuterol (VENTOLIN HFA) 108 (90 Base) MCG/ACT inhaler Inhale 2 puffs into the lungs every 4 (four) hours as needed for wheezing or shortness of breath.  . ALPRAZolam (XANAX) 0.5 MG tablet Take 1 tablet (0.5 mg total) by mouth at bedtime as needed for anxiety.  Marland Kitchen atorvastatin (LIPITOR) 80 MG tablet Take 1 tablet (80 mg total) by  mouth daily at 6 PM.  . diltiazem (CARDIZEM CD) 240 MG 24 hr capsule TAKE 1 CAPSULE DAILY  . estradiol (ESTRACE) 0.5 MG tablet Take 1 tablet (0.5 mg total) by mouth daily.  . furosemide (LASIX) 40 MG tablet Take 1 tablet (40 mg total) by mouth 2 (two) times daily.  . metoprolol succinate (TOPROL-XL) 50 MG 24 hr tablet Take 1 tablet (50 mg total) by mouth daily. Take with or immediately following a meal.  . montelukast (SINGULAIR) 10 MG tablet Take 1 tablet (10 mg total) by mouth at bedtime.  . Na Sulfate-K Sulfate-Mg Sulf (SUPREP BOWEL PREP KIT) 17.5-3.13-1.6 GM/177ML SOLN Take 1 kit by mouth as directed.  Marland Kitchen omeprazole (PRILOSEC) 40 MG capsule Take 1 capsule (40 mg total) by mouth daily.  . potassium chloride (K-DUR) 10 MEQ tablet Take 2 tablets (20 meq) by mouth once daily (Patient taking differently: Take 20 mEq by mouth daily. )  . warfarin (COUMADIN) 2.5 MG tablet TAKE 1/2 TO 1 TABLET DAILY AS DIRECTED BY COUMADIN CLINIC. (Patient taking differently: Take 1.25-2.5 mg by mouth See admin instructions. Take 2.5 mg at night on Mon, Wed, and Fri.  Take 1.25 mg at night on Sun, Tue, Thur, and Sat)   No facility-administered encounter medications on file as of 09/22/2018.     Surgical History: Past Surgical History:  Procedure Laterality Date  . ABDOMINAL HYSTERECTOMY     total  . ABDOMINAL HYSTERECTOMY    . BREAST BIOPSY Bilateral 2012  . BREAST BIOPSY Right 08-07-12   fibroadenomatous changes and columnar cells  . BREAST BIOPSY  02/03/2015   stereo byrnett  . CHOLECYSTECTOMY N/A 02/27/2016   Procedure: LAPAROSCOPIC CHOLECYSTECTOMY;  Surgeon: Christene Lye, MD;  Location: [facility] OR[facility];  Service: General;  Laterality: N/A;  . COLONOSCOPY WITH PROPOFOL N/A 09/26/2018   Procedure: COLONOSCOPY WITH PROPOFOL;  Surgeon: Lucilla Lame, MD;  Location: [facility];  Service: Endoscopy;  Laterality: N/A;  . CYSTOSCOPY W/ RETROGRADES Right 08/18/2018   Procedure: CYSTOSCOPY WITH RETROGRADE PYELOGRAM;   Surgeon: Billey Co, MD;  Location: [facility] OR[facility];  Service: Urology;  Laterality: Right;  . CYSTOSCOPY/URETEROSCOPY/HOLMIUM LASER/STENT PLACEMENT Right 08/18/2018   Procedure: CYSTOSCOPY/URETEROSCOPY/STENT PLACEMENT;  Surgeon: Billey Co, MD;  Location: [facility];  Service: Urology;  Laterality: Right;  . ESOPHAGOGASTRODUODENOSCOPY (EGD) WITH PROPOFOL N/A 09/26/2018   Procedure: ESOPHAGOGASTRODUODENOSCOPY (EGD) WITH PROPOFOL;  Surgeon: Lucilla Lame, MD;  Location: [facility];  Service: Endoscopy;  Laterality: N/A;  . JOINT REPLACEMENT Left   . KNEE CLOSED REDUCTION Left 04/15/2015   Procedure: CLOSED MANIPULATION KNEE;  Surgeon: Thornton Park, MD;  Location: [facility];  Service: Orthopedics;  Laterality: Left;  . KNEE SURGERY    . TEE WITHOUT CARDIOVERSION N/A 09/13/2017   Procedure: TRANSESOPHAGEAL ECHOCARDIOGRAM (TEE);  Surgeon: Nelva Bush, MD;  Location: [facility];  Service: Cardiovascular;  Laterality: N/A;  . TOTAL KNEE ARTHROPLASTY Left 12/25/2014   Procedure: TOTAL KNEE ARTHROPLASTY;  Surgeon: Thornton Park, MD;  Location: [facility];  Service: Orthopedics;  Laterality: Left;  . TUBAL LIGATION      Medical History: Past Medical History:  Diagnosis Date  . (HFpEF) heart failure with preserved ejection fraction ([place])    a. 08/2017 Echo: EF 55-60%.  Grade 2 diastolic dysfunction.  Moderate mitral stenosis.  . Allergy   . Anemia   . Anxiety   . BRCA negative 03/22/2013  . Bronchitis 02/19/2016   ON LEVAQUIN PO  . Chest tightness   . Cigarette smoker 09/11/2017   8-10 day  . GERD (gastroesophageal reflux disease)   . History of kidney stones   . Hyperlipidemia   . Hypertension   . Interstitial lung disease ([place])    a. CT 2013 b. 02/2018 CXR noted recurrent intersistial changes ILD vs chronic bronchitis  . Moderate mitral stenosis    a.  08/2017 TEE: EF 60 to 65%.  Moderate mitral stenosis.  Mean gradient 14 mmHg.  Valve area 2.59 cm by planimetry, 2.72 cm by pressure  half-time.  . Persistent atrial fibrillation    a. 08/2017 s/p TEE/DCCV; b. CHA2DS2VASc = 2-->warfarin.    Family History: Family History  Problem Relation Age of Onset  . Cancer Mother 80       breast  . Cancer Maternal Aunt        breast  . Cancer Maternal Grandmother        breast      Review of Systems  Constitutional: Positive for fatigue. Negative for activity change, chills and unexpected weight change.  HENT: Negative for congestion, rhinorrhea, sneezing and sore throat.   Respiratory: Positive for shortness of breath. Negative for cough, chest tightness and wheezing.   Cardiovascular: Positive for leg swelling. Negative for chest pain and palpitations.  Gastrointestinal: Negative for abdominal pain, constipation, diarrhea, nausea and vomiting.  Endocrine: Negative for cold intolerance, heat intolerance, polydipsia and polyuria.  Genitourinary: Positive for difficulty urinating, flank pain and hematuria. Negative for dysuria and frequency.  Musculoskeletal: Positive for back pain. Negative for arthralgias, joint swelling and neck pain.  Skin: Negative for rash.  Allergic/Immunologic: Positive for environmental allergies.  Neurological: Negative for dizziness, tremors, numbness and headaches.  Hematological: Negative for adenopathy. Does not bruise/bleed easily.  Psychiatric/Behavioral: Negative for behavioral problems, confusion and sleep disturbance. The patient is nervous/anxious.      Today's Vitals   09/22/18 1401  BP: 138/74  Pulse: 98  Resp: 16  SpO2: 96%  Weight: 256 lb (116.1 kg)  Height: '5\' 4"'  (1.626 m)   Body mass index is 43.94 kg/m.  Physical Exam Vitals signs and nursing note reviewed.  Constitutional:      General: She is not in acute distress.    Appearance: She is well-developed. She is obese. She is not diaphoretic.  HENT:     Head: Normocephalic and atraumatic.     Mouth/Throat:     Pharynx: No oropharyngeal exudate.  Eyes:     Pupils:  Pupils are equal, round, and reactive to light.  Neck:     Musculoskeletal: Normal range of motion and neck supple.     Thyroid: No thyromegaly.     Vascular: No carotid bruit or JVD.     Trachea: No tracheal deviation.  Cardiovascular:     Rate and Rhythm: Normal rate. Rhythm irregular.     Pulses: Normal pulses.     Heart sounds: Normal heart sounds. No murmur. No friction rub. No gallop.      Comments: There is 1+ pitting edema in bilateral lower extremities.  Pulmonary:     Effort: Pulmonary effort is normal. No respiratory distress.     Breath sounds: Normal breath sounds. No wheezing or rales.  Chest:     Chest wall: No tenderness.     Breasts:        Right: Normal. No swelling, bleeding, inverted nipple, mass, nipple discharge, skin change or tenderness.        Left: Normal. No swelling, bleeding, inverted nipple, mass, nipple discharge, skin change or tenderness.  Abdominal:     General: Bowel sounds are normal.     Palpations: Abdomen is soft.     Tenderness: There is no abdominal tenderness.  Musculoskeletal: Normal range of motion.  Lymphadenopathy:     Cervical: No cervical adenopathy.  Skin:    General: Skin is warm and dry.     Capillary Refill: Capillary refill takes less than 2 seconds.  Neurological:     Mental Status: She is alert and oriented to person, place, and time.     Cranial Nerves: No cranial nerve deficit.  Psychiatric:        Behavior: Behavior normal.        Thought Content: Thought content normal.        Judgment: Judgment normal.      LABS: Recent Results (from the past 2160 hour(s))  Urinalysis, Complete     Status: None   Collection Time: 07/18/18  1:46 PM  Result Value Ref Range   Specific Gravity, UA 1.015 1.005 - 1.030   pH, UA 6.0 5.0 - 7.5   Color, UA Yellow Yellow   Appearance Ur Clear Clear   Leukocytes,UA Negative Negative   Protein,UA Negative Negative/Trace   Glucose, UA Negative Negative   Ketones, UA Negative Negative    RBC, UA Negative Negative   Bilirubin, UA Negative Negative   Urobilinogen, Ur 0.2 0.2 - 1.0 mg/dL   Nitrite, UA Negative Negative   Microscopic Examination See below:   Microscopic Examination     Status: None   Collection Time: 07/18/18  1:46 PM   URINE  Result Value Ref  Range   WBC, UA 0-5 0 - 5 /hpf   RBC None seen 0 - 2 /hpf   Epithelial Cells (non renal) 0-10 0 - 10 /hpf   Bacteria, UA None seen None seen/Few  POCT INR     Status: None   Collection Time: 07/31/18 11:53 AM  Result Value Ref Range   INR 2.8 2.0 - 3.0  Basic metabolic panel     Status: Abnormal   Collection Time: 08/15/18  4:02 PM  Result Value Ref Range   Sodium 138 135 - 145 mmol/L   Potassium 4.4 3.5 - 5.1 mmol/L   Chloride 101 98 - 111 mmol/L   CO2 26 22 - 32 mmol/L   Glucose, Bld 104 (H) 70 - 99 mg/dL   BUN 24 (H) 6 - 20 mg/dL   Creatinine, Ser 1.25 (H) 0.44 - 1.00 mg/dL   Calcium 9.2 8.9 - 10.3 mg/dL   GFR calc non Af Amer 49 (L) >60 mL/min   GFR calc Af Amer 57 (L) >60 mL/min   Anion gap 11 5 - 15    Comment: Performed at [facility], [address] [place], [place] [address]  Brain natriuretic peptide     Status: Abnormal   Collection Time: 08/15/18  4:02 PM  Result Value Ref Range   B Natriuretic Peptide 216.0 (H) 0.0 - 100.0 pg/mL    Comment: Performed at [facility], [address] [place], [place] [address]  CBC     Status: Abnormal   Collection Time: 08/15/18  4:02 PM  Result Value Ref Range   WBC 6.1 4.0 - 10.5 K/uL   RBC 3.70 (L) 3.87 - 5.11 MIL/uL   Hemoglobin 10.0 (L) 12.0 - 15.0 g/dL   HCT 32.1 (L) 36.0 - 46.0 %   MCV 86.8 80.0 - 100.0 fL   MCH 27.0 26.0 - 34.0 pg   MCHC 31.2 30.0 - 36.0 g/dL   RDW 15.8 (H) 11.5 - 15.5 %   Platelets 395 150 - 400 K/uL   nRBC 0.0 0.0 - 0.2 %    Comment: Performed at [facility], [address] [place], [place] [address]  TSH     Status: None   Collection Time: 08/15/18  4:02 PM  Result Value Ref Range    TSH 0.857 0.350 - 4.500 uIU/mL    Comment: Performed by a 3rd Generation assay with a functional sensitivity of <=0.01 uIU/mL. Performed at [facility] [address] [place], [place] [address]   Magnesium     Status: None   Collection Time: 08/15/18  4:02 PM  Result Value Ref Range   Magnesium 2.4 1.7 - 2.4 mg/dL    Comment: Performed at [facility], [address] [place],  [address]  Urinalysis, Complete     Status: Abnormal   Collection Time: 08/17/18  2:06 PM  Result Value Ref Range   Specific Gravity, UA 1.015 1.005 - 1.030   pH, UA 6.5 5.0 - 7.5   Color, UA Yellow Yellow   Appearance Ur Cloudy (A) Clear   Leukocytes,UA Negative Negative   Protein,UA Negative Negative/Trace   Glucose, UA Negative Negative   Ketones, UA Negative Negative   RBC, UA Negative Negative   Bilirubin, UA Negative Negative   Urobilinogen, Ur 0.2 0.2 - 1.0 mg/dL   Nitrite, UA Negative Negative   Microscopic Examination See below:   CULTURE, URINE COMPREHENSIVE     Status: None   Collection  Time: 08/17/18  2:06 PM   Specimen: Urine   UR  Result Value Ref Range   Urine Culture, Comprehensive Final report    Organism ID, Bacteria Comment     Comment: Mixed urogenital flora 3,000 Colonies/mL   Microscopic Examination     Status: None   Collection Time: 08/17/18  2:06 PM   URINE  Result Value Ref Range   WBC, UA 0-5 0 - 5 /hpf   RBC None seen 0 - 2 /hpf   Epithelial Cells (non renal) 0-10 0 - 10 /hpf   Bacteria, UA Few None seen/Few  SARS Coronavirus 2 (Performed in [place] hospital lab)     Status: None   Collection Time: 08/17/18  2:25 PM   Specimen: Nasal Swab  Result Value Ref Range   SARS Coronavirus 2 NEGATIVE NEGATIVE    Comment: (NOTE) SARS-CoV-2 target nucleic acids are NOT DETECTED. The SARS-CoV-2 RNA is generally detectable in upper and lower respiratory specimens during the acute phase of infection. Negative results do not preclude SARS-CoV-2  infection, do not rule out co-infections with other pathogens, and should not be used as the sole basis for treatment or other patient management decisions. Negative results must be combined with clinical observations, patient history, and epidemiological information. The expected result is Negative. Fact Sheet for Patients: SugarRoll.be Fact Sheet for Healthcare Providers: https://www.woods-mathews.com/ This test is not yet approved or cleared by the [place] FDA and  has been authorized for detection and/or diagnosis of SARS-CoV-2 by FDA under an Emergency Use Authorization (EUA). This EUA will remain  in effect (meaning this test can be used) for the duration of the COVID-19 declaration under Section 56 4(b)(1) of the Act, 21 U.S.C. section 360bbb-3(b)(1), unless the authorization is terminated or revoked sooner. Performed at [facility], [place] [address], [place], [place] [address]   Protime-INR     Status: Abnormal   Collection Time: 08/26/18 12:21 PM  Result Value Ref Range   Prothrombin Time 27.2 (H) 11.4 - 15.2 seconds   INR 2.6 (H) 0.8 - 1.2    Comment: (NOTE) INR goal varies based on device and disease states. Performed at [facility], [address] [place], [place] [address]   Brain natriuretic peptide     Status: Abnormal   Collection Time: 08/26/18 12:21 PM  Result Value Ref Range   B Natriuretic Peptide 479.0 (H) 0.0 - 100.0 pg/mL    Comment: Performed at [facility], [address] [place], [place] [address]  Basic metabolic panel     Status: Abnormal   Collection Time: 08/26/18 12:28 PM  Result Value Ref Range   Sodium 134 (L) 135 - 145 mmol/L   Potassium 4.0 3.5 - 5.1 mmol/L   Chloride 101 98 - 111 mmol/L   CO2 21 (L) 22 - 32 mmol/L   Glucose, Bld 144 (H) 70 - 99 mg/dL   BUN 30 (H) 6 - 20 mg/dL   Creatinine, Ser 2.13 (H) 0.44 - 1.00 mg/dL   Calcium 8.2 (L) 8.9 - 10.3 mg/dL   GFR  calc non Af Amer 26 (L) >60 mL/min   GFR calc Af Amer 30 (L) >60 mL/min   Anion gap 12 5 - 15    Comment: Performed at [facility], [address] [place],  [address]  CBC     Status: Abnormal   Collection Time: 08/26/18 12:28 PM  Result Value Ref Range   WBC 16.6 (H) 4.0 -  10.5 K/uL   RBC 3.08 (L) 3.87 - 5.11 MIL/uL   Hemoglobin 8.2 (L) 12.0 - 15.0 g/dL   HCT 25.6 (L) 36.0 - 46.0 %   MCV 83.1 80.0 - 100.0 fL   MCH 26.6 26.0 - 34.0 pg   MCHC 32.0 30.0 - 36.0 g/dL   RDW 16.5 (H) 11.5 - 15.5 %   Platelets 283 150 - 400 K/uL   nRBC 0.2 0.0 - 0.2 %    Comment: Performed at [facility], [address] [place], [place] [address]  Lactic acid, plasma     Status: None   Collection Time: 08/26/18  1:59 PM  Result Value Ref Range   Lactic Acid, Venous 1.1 0.5 - 1.9 mmol/L    Comment: Performed at [facility], [address] [place], [place] [address]  Blood culture (routine x 2)     Status: None   Collection Time: 08/26/18  3:05 PM   Specimen: BLOOD  Result Value Ref Range   Specimen Description BLOOD RIGHT ANTECUBITAL    Special Requests      BOTTLES DRAWN AEROBIC AND ANAEROBIC Blood Culture adequate volume   Culture      NO GROWTH 5 DAYS Performed at [facility], [address] [place], [place] [address]    Report Status 08/31/2018 FINAL   Blood culture (routine x 2)     Status: None   Collection Time: 08/26/18  3:05 PM   Specimen: BLOOD  Result Value Ref Range   Specimen Description BLOOD BLOOD LEFT HAND    Special Requests      BOTTLES DRAWN AEROBIC AND ANAEROBIC Blood Culture adequate volume   Culture      NO GROWTH 5 DAYS Performed at [facility], [address] [place], [place] [address]    Report Status 08/31/2018 FINAL   Urinalysis, Complete w Microscopic     Status: Abnormal   Collection Time: 08/26/18  4:32 PM  Result Value Ref Range   Color, Urine AMBER (A) YELLOW    Comment: BIOCHEMICALS MAY BE  AFFECTED BY COLOR   APPearance CLOUDY (A) CLEAR   Specific Gravity, Urine 1.011 1.005 - 1.030   pH 5.0 5.0 - 8.0   Glucose, UA NEGATIVE NEGATIVE mg/dL   Hgb urine dipstick MODERATE (A) NEGATIVE   Bilirubin Urine NEGATIVE NEGATIVE   Ketones, ur NEGATIVE NEGATIVE mg/dL   Protein, ur 100 (A) NEGATIVE mg/dL   Nitrite NEGATIVE NEGATIVE   Leukocytes,Ua LARGE (A) NEGATIVE   RBC / HPF 21-50 0 - 5 RBC/hpf   WBC, UA >50 (H) 0 - 5 WBC/hpf   Bacteria, UA FEW (A) NONE SEEN   Squamous Epithelial / LPF 0-5 0 - 5   WBC Clumps PRESENT    Mucus PRESENT    Amorphous Crystal PRESENT     Comment: Performed at [facility], [address] [place], [place] [address]  Urine culture     Status: Abnormal   Collection Time: 08/26/18  4:32 PM   Specimen: Urine, Clean Catch  Result Value Ref Range   Specimen Description      URINE, CLEAN CATCH Performed at [facility], [address] [place], [place] [address]    Special Requests      Normal Performed at [facility], [address] [place], [place] [address]    Culture >=100,000 COLONIES/mL ESCHERICHIA COLI (A)    Report Status 08/29/2018 FINAL    Organism ID, Bacteria ESCHERICHIA COLI (A)  Susceptibility   Escherichia coli - MIC*    AMPICILLIN <=2 SENSITIVE Sensitive     CEFAZOLIN <=4 SENSITIVE Sensitive     CEFTRIAXONE <=1 SENSITIVE Sensitive     CIPROFLOXACIN >=4 RESISTANT Resistant     GENTAMICIN <=1 SENSITIVE Sensitive     IMIPENEM <=0.25 SENSITIVE Sensitive     NITROFURANTOIN <=16 SENSITIVE Sensitive     TRIMETH/SULFA >=320 RESISTANT Resistant     AMPICILLIN/SULBACTAM <=2 SENSITIVE Sensitive     PIP/TAZO <=4 SENSITIVE Sensitive     Extended ESBL NEGATIVE Sensitive     * >=100,000 COLONIES/mL ESCHERICHIA COLI  SARS Coronavirus 2 [facility] order, Performed in [facility] hospital lab) Nasopharyngeal Nasopharyngeal Swab     Status: None   Collection Time: 08/26/18  4:34 PM   Specimen: Nasopharyngeal  Swab  Result Value Ref Range   SARS Coronavirus 2 NEGATIVE NEGATIVE    Comment: (NOTE) If result is NEGATIVE SARS-CoV-2 target nucleic acids are NOT DETECTED. The SARS-CoV-2 RNA is generally detectable in upper and lower  respiratory specimens during the acute phase of infection. The lowest  concentration of SARS-CoV-2 viral copies this assay can detect is 250  copies / mL. A negative result does not preclude SARS-CoV-2 infection  and should not be used as the sole basis for treatment or other  patient management decisions.  A negative result may occur with  improper specimen collection / handling, submission of specimen other  than nasopharyngeal swab, presence of viral mutation(s) within the  areas targeted by this assay, and inadequate number of viral copies  (<250 copies / mL). A negative result must be combined with clinical  observations, patient history, and epidemiological information. If result is POSITIVE SARS-CoV-2 target nucleic acids are DETECTED. The SARS-CoV-2 RNA is generally detectable in upper and lower  respiratory specimens dur ing the acute phase of infection.  Positive  results are indicative of active infection with SARS-CoV-2.  Clinical  correlation with patient history and other diagnostic information is  necessary to determine patient infection status.  Positive results do  not rule out bacterial infection or co-infection with other viruses. If result is PRESUMPTIVE POSTIVE SARS-CoV-2 nucleic acids MAY BE PRESENT.   A presumptive positive result was obtained on the submitted specimen  and confirmed on repeat testing.  While 2019 novel coronavirus  (SARS-CoV-2) nucleic acids may be present in the submitted sample  additional confirmatory testing may be necessary for epidemiological  and / or clinical management purposes  to differentiate between  SARS-CoV-2 and other Sarbecovirus currently known to infect humans.  If clinically indicated additional testing  with an alternate test  methodology [phone]) is advised. The SARS-CoV-2 RNA is generally  detectable in upper and lower respiratory sp ecimens during the acute  phase of infection. The expected result is Negative. Fact Sheet for Patients:  StrictlyIdeas.no Fact Sheet for Healthcare Providers: BankingDealers.co.za This test is not yet approved or cleared by the [place] FDA and has been authorized for detection and/or diagnosis of SARS-CoV-2 by FDA under an Emergency Use Authorization (EUA).  This EUA will remain in effect (meaning this test can be used) for the duration of the COVID-19 declaration under Section 564(b)(1) of the Act, 21 U.S.C. section 360bbb-3(b)(1), unless the authorization is terminated or revoked sooner. Performed at [facility], [address] [place], [place] [address]   HIV antibody (Routine Testing)     Status: None   Collection Time: 08/26/18  8:24 PM  Result Value Ref Range  HIV Screen 4th Generation wRfx Non Reactive Non Reactive    Comment: (NOTE) Performed At: [facility] [place], [place] [id number] Rush Farmer MD [id number]   TSH     Status: None   Collection Time: 08/26/18  8:24 PM  Result Value Ref Range   TSH 0.864 0.350 - 4.500 uIU/mL    Comment: Performed by a 3rd Generation assay with a functional sensitivity of <=0.01 uIU/mL. Performed at [facility], [address] [place], [place] [address]   Hemoglobin A1c     Status: Abnormal   Collection Time: 08/26/18  8:24 PM  Result Value Ref Range   Hgb A1c MFr Bld 6.0 (H) 4.8 - 5.6 %    Comment: (NOTE) Pre diabetes:          5.7%-6.4% Diabetes:              >6.4% Glycemic control for   <7.0% adults with diabetes    Mean Plasma Glucose 125.5 mg/dL    Comment: Performed at [facility] [address], [place], [place] [address]  Brain natriuretic peptide     Status: Abnormal    Collection Time: 08/26/18  8:24 PM  Result Value Ref Range   B Natriuretic Peptide 539.0 (H) 0.0 - 100.0 pg/mL    Comment: Performed at [facility], [address] [place], [place] [address]  Protime-INR     Status: Abnormal   Collection Time: 08/27/18  4:20 AM  Result Value Ref Range   Prothrombin Time 41.8 (H) 11.4 - 15.2 seconds   INR 4.5 (HH) 0.8 - 1.2    Comment: CRITICAL RESULT CALLED TO, READ BACK BY AND VERIFIED WITH:  CRYSTAL BASS AT 0530 08/27/2018 SDR (NOTE) INR goal varies based on device and disease states. Performed at [facility], [address] [place], [place] [address]   Basic metabolic panel     Status: Abnormal   Collection Time: 08/27/18  4:20 AM  Result Value Ref Range   Sodium 135 135 - 145 mmol/L   Potassium 5.6 (H) 3.5 - 5.1 mmol/L   Chloride 104 98 - 111 mmol/L   CO2 21 (L) 22 - 32 mmol/L   Glucose, Bld 108 (H) 70 - 99 mg/dL   BUN 42 (H) 6 - 20 mg/dL   Creatinine, Ser 3.24 (H) 0.44 - 1.00 mg/dL   Calcium 8.1 (L) 8.9 - 10.3 mg/dL   GFR calc non Af Amer 16 (L) >60 mL/min   GFR calc Af Amer 18 (L) >60 mL/min   Anion gap 10 5 - 15    Comment: Performed at [facility], [address] [place], [place] [address]  CBC     Status: Abnormal   Collection Time: 08/27/18  4:20 AM  Result Value Ref Range   WBC 16.8 (H) 4.0 - 10.5 K/uL   RBC 2.62 (L) 3.87 - 5.11 MIL/uL   Hemoglobin 6.9 (L) 12.0 - 15.0 g/dL   HCT 22.4 (L) 36.0 - 46.0 %   MCV 85.5 80.0 - 100.0 fL   MCH 26.3 26.0 - 34.0 pg   MCHC 30.8 30.0 - 36.0 g/dL   RDW 16.9 (H) 11.5 - 15.5 %   Platelets 239 150 - 400 K/uL   nRBC 0.3 (H) 0.0 - 0.2 %    Comment: Performed at [facility], [address] [place], [place] [address]  Prepare RBC     Status: None   Collection Time: 08/27/18  6:57 AM  Result Value Ref Range   Order Confirmation      ORDER PROCESSED BY BLOOD BANK Performed at [facility], [address] [place], [place] [address]   Type  and screen [place]     Status: None   Collection Time: 08/27/18  8:21 AM  Result Value Ref Range   ABO/RH(D) A POS    Antibody Screen NEG    Sample Expiration 08/30/2018,2359    Unit Number [id number]    Blood Component Type RED CELLS,LR    Unit division 00    Status of Unit ISSUED,FINAL    Transfusion Status OK TO TRANSFUSE    Crossmatch Result Compatible    Unit Number [id number]    Blood Component Type RED CELLS,LR    Unit division 00    Status of Unit ISSUED,FINAL    Transfusion Status OK TO TRANSFUSE    Crossmatch Result      Compatible Performed at [facility], [address] [place], [place] [address]   BPAM RBC     Status: None   Collection Time: 08/27/18  8:21 AM  Result Value Ref Range   ISSUE DATE / TIME 376283151761    Blood Product Unit Number [id number]    PRODUCT CODE N4627O35    Unit Type and Rh 0093    Blood Product Expiration Date 818299371696    ISSUE DATE / TIME 789381017510    Blood Product Unit Number [id number]    PRODUCT CODE E0382V00    Unit Type and Rh 6200    Blood Product Expiration Date 361443154008   CBC     Status: Abnormal   Collection Time: 08/28/18  5:54 AM  Result Value Ref Range   WBC 15.3 (H) 4.0 - 10.5 K/uL   RBC 3.10 (L) 3.87 - 5.11 MIL/uL   Hemoglobin 8.3 (L) 12.0 - 15.0 g/dL   HCT 25.9 (L) 36.0 - 46.0 %   MCV 83.5 80.0 - 100.0 fL   MCH 26.8 26.0 - 34.0 pg   MCHC 32.0 30.0 - 36.0 g/dL   RDW 16.9 (H) 11.5 - 15.5 %   Platelets 220 150 - 400 K/uL   nRBC 0.3 (H) 0.0 - 0.2 %    Comment: Performed at [facility], [address] [place], [place] [address]  Basic metabolic panel     Status: Abnormal   Collection Time: 08/28/18  5:54 AM  Result Value Ref Range   Sodium 133 (L) 135 - 145 mmol/L   Potassium 4.3 3.5 - 5.1 mmol/L   Chloride 101 98 - 111 mmol/L   CO2 21 (L) 22 - 32 mmol/L   Glucose, Bld 93 70 - 99 mg/dL   BUN 58 (H) 6 - 20 mg/dL   Creatinine, Ser 4.10 (H)  0.44 - 1.00 mg/dL   Calcium 8.2 (L) 8.9 - 10.3 mg/dL   GFR calc non Af Amer 12 (L) >60 mL/min   GFR calc Af Amer 14 (L) >60 mL/min   Anion gap 11 5 - 15    Comment: Performed at [facility], [address] [place], [place] [address]  Protime-INR     Status: Abnormal   Collection Time: 08/28/18  5:54 AM  Result Value Ref Range   Prothrombin Time 47.0 (H) 11.4 - 15.2 seconds   INR 5.2 (HH) 0.8 - 1.2    Comment: CRITICAL RESULT CALLED TO, READ BACK BY AND VERIFIED WITH: TAMARA CONYERS ON 08/28/2018 AT 0701 JJB (  NOTE) INR goal varies based on device and disease states. Performed at [facility], [address] [place], [place] [address]   Glucose, capillary     Status: None   Collection Time: 08/28/18  4:34 PM  Result Value Ref Range   Glucose-Capillary 98 70 - 99 mg/dL  CBC     Status: Abnormal   Collection Time: 08/29/18  6:08 AM  Result Value Ref Range   WBC 15.3 (H) 4.0 - 10.5 K/uL   RBC 3.23 (L) 3.87 - 5.11 MIL/uL   Hemoglobin 8.7 (L) 12.0 - 15.0 g/dL   HCT 27.4 (L) 36.0 - 46.0 %   MCV 84.8 80.0 - 100.0 fL   MCH 26.9 26.0 - 34.0 pg   MCHC 31.8 30.0 - 36.0 g/dL   RDW 16.9 (H) 11.5 - 15.5 %   Platelets 227 150 - 400 K/uL   nRBC 0.3 (H) 0.0 - 0.2 %    Comment: Performed at [facility], [address] [place], [place] [address]  Basic metabolic panel     Status: Abnormal   Collection Time: 08/29/18  6:08 AM  Result Value Ref Range   Sodium 135 135 - 145 mmol/L   Potassium 4.5 3.5 - 5.1 mmol/L   Chloride 102 98 - 111 mmol/L   CO2 21 (L) 22 - 32 mmol/L   Glucose, Bld 101 (H) 70 - 99 mg/dL   BUN 68 (H) 6 - 20 mg/dL   Creatinine, Ser 3.79 (H) 0.44 - 1.00 mg/dL   Calcium 8.9 8.9 - 10.3 mg/dL   GFR calc non Af Amer 13 (L) >60 mL/min   GFR calc Af Amer 15 (L) >60 mL/min   Anion gap 12 5 - 15    Comment: Performed at [facility], [address] [place], [place] [address]  Protime-INR     Status: Abnormal   Collection Time:  08/29/18  6:08 AM  Result Value Ref Range   Prothrombin Time 38.7 (H) 11.4 - 15.2 seconds   INR 4.0 (H) 0.8 - 1.2    Comment: (NOTE) INR goal varies based on device and disease states. Performed at [facility], [address] [place], [place] [address]   Magnesium     Status: Abnormal   Collection Time: 08/29/18  6:08 AM  Result Value Ref Range   Magnesium 2.6 (H) 1.7 - 2.4 mg/dL    Comment: Performed at [facility], [address] [place], [place] [address]  ECHOCARDIOGRAM COMPLETE     Status: None   Collection Time: 08/29/18  2:00 PM  Result Value Ref Range   Weight 4,232 oz   Height 64 in   BP 144/75 mmHg  CBC     Status: Abnormal   Collection Time: 08/30/18  6:15 AM  Result Value Ref Range   WBC 12.4 (H) 4.0 - 10.5 K/uL   RBC 2.94 (L) 3.87 - 5.11 MIL/uL   Hemoglobin 7.9 (L) 12.0 - 15.0 g/dL   HCT 25.3 (L) 36.0 - 46.0 %   MCV 86.1 80.0 - 100.0 fL   MCH 26.9 26.0 - 34.0 pg   MCHC 31.2 30.0 - 36.0 g/dL   RDW 16.7 (H) 11.5 - 15.5 %   Platelets 227 150 - 400 K/uL   nRBC 0.6 (H) 0.0 - 0.2 %    Comment: Performed at [facility], [address] [place], [place] [address]  Basic metabolic panel     Status: Abnormal   Collection  Time: 08/30/18  6:15 AM  Result Value Ref Range   Sodium 138 135 - 145 mmol/L   Potassium 4.2 3.5 - 5.1 mmol/L   Chloride 105 98 - 111 mmol/L   CO2 22 22 - 32 mmol/L   Glucose, Bld 99 70 - 99 mg/dL   BUN 65 (H) 6 - 20 mg/dL   Creatinine, Ser 2.86 (H) 0.44 - 1.00 mg/dL   Calcium 9.0 8.9 - 10.3 mg/dL   GFR calc non Af Amer 18 (L) >60 mL/min   GFR calc Af Amer 21 (L) >60 mL/min   Anion gap 11 5 - 15    Comment: Performed at [facility], [address] [place], [place] [address]  Protime-INR     Status: Abnormal   Collection Time: 08/30/18  6:15 AM  Result Value Ref Range   Prothrombin Time 36.7 (H) 11.4 - 15.2 seconds   INR 3.8 (H) 0.8 - 1.2    Comment: (NOTE) INR goal varies based on device and  disease states. Performed at [facility], [address] [place], [place] [address]   Prepare RBC     Status: None   Collection Time: 08/30/18  1:58 PM  Result Value Ref Range   Order Confirmation      ORDER PROCESSED BY BLOOD BANK Performed at [facility], [address] [place], [place] [address]   Protime-INR     Status: Abnormal   Collection Time: 08/31/18  5:52 AM  Result Value Ref Range   Prothrombin Time 30.0 (H) 11.4 - 15.2 seconds   INR 2.9 (H) 0.8 - 1.2    Comment: (NOTE) INR goal varies based on device and disease states. Performed at [facility], [address] [place], [place] [address]   Basic metabolic panel     Status: Abnormal   Collection Time: 08/31/18  5:52 AM  Result Value Ref Range   Sodium 141 135 - 145 mmol/L   Potassium 4.1 3.5 - 5.1 mmol/L   Chloride 106 98 - 111 mmol/L   CO2 24 22 - 32 mmol/L   Glucose, Bld 102 (H) 70 - 99 mg/dL   BUN 62 (H) 6 - 20 mg/dL   Creatinine, Ser 2.38 (H) 0.44 - 1.00 mg/dL   Calcium 8.7 (L) 8.9 - 10.3 mg/dL   GFR calc non Af Amer 23 (L) >60 mL/min   GFR calc Af Amer 26 (L) >60 mL/min   Anion gap 11 5 - 15    Comment: Performed at [facility], [address] [place], [place] [address]  CBC     Status: Abnormal   Collection Time: 08/31/18  5:52 AM  Result Value Ref Range   WBC 12.5 (H) 4.0 - 10.5 K/uL   RBC 3.36 (L) 3.87 - 5.11 MIL/uL   Hemoglobin 9.0 (L) 12.0 - 15.0 g/dL   HCT 29.0 (L) 36.0 - 46.0 %   MCV 86.3 80.0 - 100.0 fL   MCH 26.8 26.0 - 34.0 pg   MCHC 31.0 30.0 - 36.0 g/dL   RDW 16.4 (H) 11.5 - 15.5 %   Platelets 257 150 - 400 K/uL   nRBC 0.2 0.0 - 0.2 %    Comment: Performed at [facility], [address] [place],  [address]  Uric acid     Status: Abnormal   Collection Time: 08/31/18 11:06 AM  Result Value Ref Range   Uric Acid, Serum 9.3 (H) 2.5 - 7.1 mg/dL  Comment: Performed at [facility], [address]  [place], [place] [address]  Occult blood card to lab, stool     Status: Abnormal   Collection Time: 08/31/18  6:23 PM  Result Value Ref Range   Fecal Occult Bld POSITIVE (A) NEGATIVE    Comment: Performed at [facility], [address] [place], [place] [address]  Protime-INR     Status: Abnormal   Collection Time: 09/01/18  6:31 AM  Result Value Ref Range   Prothrombin Time 28.9 (H) 11.4 - 15.2 seconds   INR 2.8 (H) 0.8 - 1.2    Comment: (NOTE) INR goal varies based on device and disease states. Performed at [facility], [address] [place], [place] [address]   Basic metabolic panel     Status: Abnormal   Collection Time: 09/01/18  6:31 AM  Result Value Ref Range   Sodium 141 135 - 145 mmol/L   Potassium 4.6 3.5 - 5.1 mmol/L   Chloride 109 98 - 111 mmol/L   CO2 25 22 - 32 mmol/L   Glucose, Bld 113 (H) 70 - 99 mg/dL   BUN 49 (H) 6 - 20 mg/dL   Creatinine, Ser 1.77 (H) 0.44 - 1.00 mg/dL   Calcium 8.6 (L) 8.9 - 10.3 mg/dL   GFR calc non Af Amer 32 (L) >60 mL/min   GFR calc Af Amer 38 (L) >60 mL/min   Anion gap 7 5 - 15    Comment: Performed at [facility], [address] [place], [place] [address]  Magnesium     Status: Abnormal   Collection Time: 09/01/18  6:31 AM  Result Value Ref Range   Magnesium 2.7 (H) 1.7 - 2.4 mg/dL    Comment: Performed at [facility], [address] [place], [place] [address]  POCT INR     Status: None   Collection Time: 09/04/18 11:51 AM  Result Value Ref Range   INR 2.5 2.0 - 3.0  CBC with Differential/Platelet     Status: Abnormal   Collection Time: 09/14/18 10:37 AM  Result Value Ref Range   WBC 7.8 3.4 - 10.8 x10E3/uL   RBC 3.90 3.77 - 5.28 x10E6/uL   Hemoglobin 10.7 (L) 11.1 - 15.9 g/dL   Hematocrit 33.7 (L) 34.0 - 46.6 %   MCV 86 79 - 97 fL   MCH 27.4 26.6 - 33.0 pg   MCHC 31.8 31.5 - 35.7 g/dL   RDW 15.3 11.7 - 15.4 %   Platelets 485 (H) 150 - 450 x10E3/uL   Neutrophils 64 Not Estab. %    Lymphs 21 Not Estab. %   Monocytes 10 Not Estab. %   Eos 4 Not Estab. %   Basos 1 Not Estab. %   Neutrophils Absolute 4.9 1.4 - 7.0 x10E3/uL   Lymphocytes Absolute 1.6 0.7 - 3.1 x10E3/uL   Monocytes Absolute 0.8 0.1 - 0.9 x10E3/uL   EOS (ABSOLUTE) 0.3 0.0 - 0.4 x10E3/uL   Basophils Absolute 0.1 0.0 - 0.2 x10E3/uL   Immature Granulocytes 0 Not Estab. %   Immature Grans (Abs) 0.0 0.0 - 0.1 x10E3/uL  Comprehensive metabolic panel     Status: Abnormal   Collection Time: 09/19/18  9:32 AM  Result Value Ref Range   Sodium 141 135 - 145 mmol/L   Potassium 3.4 (L) 3.5 - 5.1 mmol/L   Chloride 102 98 - 111 mmol/L   CO2 28 22 - 32 mmol/L   Glucose, Bld 115 (H)  70 - 99 mg/dL   BUN 21 (H) 6 - 20 mg/dL   Creatinine, Ser 1.70 (H) 0.44 - 1.00 mg/dL   Calcium 9.0 8.9 - 10.3 mg/dL   Total Protein 7.8 6.5 - 8.1 g/dL   Albumin 3.5 3.5 - 5.0 g/dL   AST 20 15 - 41 U/L   ALT 20 0 - 44 U/L   Alkaline Phosphatase 133 (H) 38 - 126 U/L   Total Bilirubin 0.5 0.3 - 1.2 mg/dL   GFR calc non Af Amer 34 (L) >60 mL/min   GFR calc Af Amer 39 (L) >60 mL/min   Anion gap 11 5 - 15    Comment: Performed at [facility], [address] [place], [place] [address]  Lipid panel     Status: Abnormal   Collection Time: 09/19/18  9:32 AM  Result Value Ref Range   Cholesterol 171 0 - 200 mg/dL   Triglycerides 186 (H) <150 mg/dL   HDL 33 (L) >40 mg/dL   Total CHOL/HDL Ratio 5.2 RATIO   VLDL 37 0 - 40 mg/dL   LDL Cholesterol 101 (H) 0 - 99 mg/dL    Comment:        Total Cholesterol/HDL:CHD Risk Coronary Heart Disease Risk Table                     Men   Women  1/2 Average Risk   3.4   3.3  Average Risk       5.0   4.4  2 X Average Risk   9.6   7.1  3 X Average Risk  23.4   11.0        Use the calculated Patient Ratio above and the CHD Risk Table to determine the patient's CHD Risk.        ATP III CLASSIFICATION (LDL):  <100     mg/dL   Optimal  100-129  mg/dL   Near or Above                     Optimal  130-159  mg/dL   Borderline  160-189  mg/dL   High  >190     mg/dL   Very High Performed at [facility], [address] [place], [place] [address]   CBC     Status: Abnormal   Collection Time: 09/19/18  9:32 AM  Result Value Ref Range   WBC 6.6 4.0 - 10.5 K/uL   RBC 3.95 3.87 - 5.11 MIL/uL   Hemoglobin 10.9 (L) 12.0 - 15.0 g/dL   HCT 34.2 (L) 36.0 - 46.0 %   MCV 86.6 80.0 - 100.0 fL   MCH 27.6 26.0 - 34.0 pg   MCHC 31.9 30.0 - 36.0 g/dL   RDW 15.9 (H) 11.5 - 15.5 %   Platelets 292 150 - 400 K/uL   nRBC 0.0 0.0 - 0.2 %    Comment: Performed at [facility], [address], [place], [place] [address]  Celiac Disease Panel     Status: Abnormal   Collection Time: 09/19/18  3:06 PM  Result Value Ref Range   Endomysial IgA Negative Negative   Transglutaminase IgA <2 0 - 3 U/mL    Comment:                               Negative        0 -  3                               Weak Positive   4 - 10                               Positive           >10  Tissue Transglutaminase (tTG) has been identified  as the endomysial antigen.  Studies have demonstr-  ated that endomysial IgA antibodies have over 99%  specificity for gluten sensitive enteropathy.    IgA/Immunoglobulin A, Serum 436 (H) 87 - 352 mg/dL  H. pylori antibody, IgG     Status: Abnormal   Collection Time: 09/19/18  3:06 PM  Result Value Ref Range   H. pylori, IgG AbS 1.20 (H) 0.00 - 0.79 Index Value    Comment:                              Negative           <0.80                              Equivocal    0.80 - 0.89                              Positive           >0.89   SARS CORONAVIRUS 2 (TAT 6-24 HRS) Nasopharyngeal Nasopharyngeal Swab     Status: None   Collection Time: 09/22/18 11:16 AM   Specimen: Nasopharyngeal Swab  Result Value Ref Range   SARS Coronavirus 2 NEGATIVE NEGATIVE    Comment: (NOTE) SARS-CoV-2 target nucleic acids are NOT DETECTED. The SARS-CoV-2 RNA is generally  detectable in upper and lower respiratory specimens during the acute phase of infection. Negative results do not preclude SARS-CoV-2 infection, do not rule out co-infections with other pathogens, and should not be used as the sole basis for treatment or other patient management decisions. Negative results must be combined with clinical observations, patient history, and epidemiological information. The expected result is Negative. Fact Sheet for Patients: SugarRoll.be Fact Sheet for Healthcare Providers: https://www.woods-mathews.com/ This test is not yet approved or cleared by the [place] FDA and  has been authorized for detection and/or diagnosis of SARS-CoV-2 by FDA under an Emergency Use Authorization (EUA). This EUA will remain  in effect (meaning this test can be used) for the duration of the COVID-19 declaration under Section 56 4(b)(1) of the Act, 21 U.S.C. section 360bbb-3(b)(1), unless the authorization is terminated or revoked sooner. Performed at [facility], [place] [address] [place],  [address]   UA/M w/rflx Culture, Routine     Status: Abnormal   Collection Time: 09/22/18  2:03 PM   Specimen: Urine   URINE  Result Value Ref Range   Specific Gravity, UA 1.012 1.005 - 1.030   pH, UA 6.0 5.0 - 7.5   Color, UA Yellow Yellow   Appearance Ur Cloudy (A) Clear   Leukocytes,UA Negative Negative   Protein,UA Negative Negative/Trace   Glucose, UA Negative Negative   Ketones, UA Negative Negative   RBC, UA Negative Negative   Bilirubin, UA Negative Negative   Urobilinogen, Ur 0.2 0.2 -  1.0 mg/dL   Nitrite, UA Negative Negative   Microscopic Examination Comment     Comment: Microscopic follows if indicated.   Microscopic Examination See below:     Comment: Microscopic was indicated and was performed.   Urinalysis Reflex Comment     Comment: This specimen has reflexed to a Urine Culture.  Microscopic Examination      Status: Abnormal   Collection Time: 09/22/18  2:03 PM   URINE  Result Value Ref Range   WBC, UA 6-10 (A) 0 - 5 /hpf   RBC 3-10 (A) 0 - 2 /hpf   Epithelial Cells (non renal) 0-10 0 - 10 /hpf   Casts Present (A) None seen /lpf   Cast Type Hyaline casts N/A   Mucus, UA Present Not Estab.   Bacteria, UA Few None seen/Few  Urine Culture, Reflex     Status: Abnormal   Collection Time: 09/22/18  2:03 PM   URINE  Result Value Ref Range   Urine Culture, Routine Final report (A)    Organism ID, Bacteria Escherichia coli (A)     Comment: Greater than 100,000 colony forming units per mL Cefazolin <=4 ug/mL Cefazolin with an MIC <=16 predicts susceptibility to the oral agents cefaclor, cefdinir, cefpodoxime, cefprozil, cefuroxime, cephalexin, and loracarbef when used for therapy of uncomplicated urinary tract infections due to E. coli, Klebsiella pneumoniae, and Proteus mirabilis.    Antimicrobial Susceptibility Comment     Comment:       ** S = Susceptible; I = Intermediate; R = Resistant **                    P = Positive; N = Negative             MICS are expressed in micrograms per mL    Antibiotic                 RSLT#1    RSLT#2    RSLT#3    RSLT#4 Amoxicillin/Clavulanic Acid    S Ampicillin                     S Cefepime                       S Ceftriaxone                    S Cefuroxime                     S Ciprofloxacin                  R Ertapenem                      S Gentamicin                     S Imipenem                       S Levofloxacin                   R Meropenem                      S Nitrofurantoin                 S Piperacillin/Tazobactam        S Tetracycline  S Tobramycin                     S Trimethoprim/Sulfa             R   Surgical pathology     Status: None   Collection Time: 09/26/18  8:38 AM  Result Value Ref Range   SURGICAL PATHOLOGY      Surgical Pathology CASE: [id number] PATIENT: Doloris Belmonte Surgical  Pathology Report     SPECIMEN SUBMITTED: A. Colon polyp, descending; cold snare  CLINICAL HISTORY: None provided  PRE-OPERATIVE DIAGNOSIS: Iron deficiency anemia D50.0  POST-OPERATIVE DIAGNOSIS: Normal endoscopy; diverticulosis; polyps    DIAGNOSIS: A. COLON POLYP, DESCENDING; COLD SNARE: - POLYPOID FRAGMENT OF BENIGN COLONIC MUCOSA WITH SUPERFICIAL REACTIVE/REPARATIVE CHANGES AND SMALL LYMPHOID AGGREGATE. - NEGATIVE FOR DYSPLASIA AND MALIGNANCY.  Comment: Multiple additional deeper recut levels were examined.  GROSS DESCRIPTION: A.  Labeled: Cold snare descending colon polyp. Received: In formalin Tissue fragment(s): 1 Size: 0.7 x 0.4 x 0.4 cm Description: Tan soft polypoid lesion Entirely submitted in 1 cassette bisected polyp.   Final Diagnosis performed by Allena Napoleon, MD.   Electronically signed 09/27/2018 2:42:06PM The electronic signature indicates that the named Attend ing Pathologist has evaluated the specimen  Technical component performed at [facility], [address] [place], [place] [address] Lab: [phone] Dir: Rush Farmer, MD, MMM  Professional component performed at [facility], [address], [place], [place] [address] Lab: [phone] Dir: Dellia Nims. Reuel Derby, MD     Assessment/Plan: 1. Encounter for other preprocedural examination Annual health maintenance exam today.   2. Pyelonephritis Persistent pyelonephritis leading to sepsis and hospitalization. Refer to new urology provider for further evaluation and treatment.  - Ambulatory referral to Urology  3. Renal calculus, right Referral to new urologist for further evaluation and treatment.  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   5. Paroxysmal atrial fibrillation ([place]) Continue regular visits with cardiology.   6. Dysuria - UA/M w/rflx Culture, Routine  General  Counseling: Quanasia verbalizes understanding of the findings of todays visit and agrees with plan of treatment. I have discussed any further diagnostic evaluation that may be needed or ordered today. We also reviewed her medications today. she has been encouraged to call the office with any questions or concerns that should arise related to todays visit.    Counseling:  This patient was seen by Leretha Pol FNP Collaboration with Dr Lavera Guise as a part of collaborative care agreement  Orders Placed This Encounter  Procedures  . Microscopic Examination  . Urine Culture, Reflex  . UA/M w/rflx Culture, Routine  . Ambulatory referral to Urology     Time spent: White, MD  Internal Medicine

## 2018-09-26 ENCOUNTER — Other Ambulatory Visit: Payer: Self-pay

## 2018-09-26 ENCOUNTER — Ambulatory Visit
Admission: RE | Admit: 2018-09-26 | Discharge: 2018-09-26 | Disposition: A | Discharge status: Home / Self Care | Payer: Managed Care, Other (non HMO) | Attending: Gastroenterology | Admitting: Gastroenterology

## 2018-09-26 ENCOUNTER — Ambulatory Visit: Payer: Managed Care, Other (non HMO) | Admitting: Anesthesiology

## 2018-09-26 ENCOUNTER — Encounter
Admission: RE | Disposition: A | Discharge status: Home / Self Care | Payer: Self-pay | Source: Home / Self Care | Attending: Gastroenterology

## 2018-09-26 ENCOUNTER — Encounter: Payer: Self-pay | Admitting: Anesthesiology

## 2018-09-26 DIAGNOSIS — K219 Gastro-esophageal reflux disease without esophagitis: Secondary | ICD-10-CM | POA: Insufficient documentation

## 2018-09-26 DIAGNOSIS — D124 Benign neoplasm of descending colon: Secondary | ICD-10-CM | POA: Diagnosis not present

## 2018-09-26 DIAGNOSIS — Z96652 Presence of left artificial knee joint: Secondary | ICD-10-CM | POA: Diagnosis not present

## 2018-09-26 DIAGNOSIS — Z7901 Long term (current) use of anticoagulants: Secondary | ICD-10-CM | POA: Diagnosis not present

## 2018-09-26 DIAGNOSIS — K5730 Diverticulosis of large intestine without perforation or abscess without bleeding: Secondary | ICD-10-CM | POA: Diagnosis not present

## 2018-09-26 DIAGNOSIS — Z79890 Hormone replacement therapy (postmenopausal): Secondary | ICD-10-CM | POA: Diagnosis not present

## 2018-09-26 DIAGNOSIS — D500 Iron deficiency anemia secondary to blood loss (chronic): Secondary | ICD-10-CM

## 2018-09-26 DIAGNOSIS — I2510 Atherosclerotic heart disease of native coronary artery without angina pectoris: Secondary | ICD-10-CM | POA: Insufficient documentation

## 2018-09-26 DIAGNOSIS — E785 Hyperlipidemia, unspecified: Secondary | ICD-10-CM | POA: Diagnosis not present

## 2018-09-26 DIAGNOSIS — K635 Polyp of colon: Secondary | ICD-10-CM

## 2018-09-26 DIAGNOSIS — F419 Anxiety disorder, unspecified: Secondary | ICD-10-CM | POA: Diagnosis not present

## 2018-09-26 DIAGNOSIS — D509 Iron deficiency anemia, unspecified: Secondary | ICD-10-CM | POA: Insufficient documentation

## 2018-09-26 DIAGNOSIS — D649 Anemia, unspecified: Secondary | ICD-10-CM | POA: Diagnosis present

## 2018-09-26 DIAGNOSIS — I5032 Chronic diastolic (congestive) heart failure: Secondary | ICD-10-CM | POA: Diagnosis not present

## 2018-09-26 DIAGNOSIS — Z79899 Other long term (current) drug therapy: Secondary | ICD-10-CM | POA: Insufficient documentation

## 2018-09-26 DIAGNOSIS — Z87891 Personal history of nicotine dependence: Secondary | ICD-10-CM | POA: Diagnosis not present

## 2018-09-26 DIAGNOSIS — I110 Hypertensive heart disease with heart failure: Secondary | ICD-10-CM | POA: Insufficient documentation

## 2018-09-26 DIAGNOSIS — I4819 Other persistent atrial fibrillation: Secondary | ICD-10-CM | POA: Diagnosis not present

## 2018-09-26 DIAGNOSIS — K640 First degree hemorrhoids: Secondary | ICD-10-CM | POA: Diagnosis not present

## 2018-09-26 HISTORY — PX: COLONOSCOPY WITH PROPOFOL: SHX5780

## 2018-09-26 HISTORY — PX: ESOPHAGOGASTRODUODENOSCOPY (EGD) WITH PROPOFOL: SHX5813

## 2018-09-26 SURGERY — COLONOSCOPY WITH PROPOFOL
Anesthesia: General

## 2018-09-26 MED ORDER — PROPOFOL 500 MG/50ML IV EMUL
INTRAVENOUS | Status: AC
  Filled 2018-09-26: qty 50

## 2018-09-26 MED ORDER — PROPOFOL 10 MG/ML IV BOLUS
INTRAVENOUS | Status: DC | PRN
  Administered 2018-09-26: 90 mg via INTRAVENOUS

## 2018-09-26 MED ORDER — PHENYLEPHRINE HCL (PRESSORS) 10 MG/ML IV SOLN
INTRAVENOUS | Status: AC
  Filled 2018-09-26: qty 1

## 2018-09-26 MED ORDER — SODIUM CHLORIDE 0.9 % IV SOLN
INTRAVENOUS | Status: DC
  Administered 2018-09-26: 1000 mL via INTRAVENOUS

## 2018-09-26 MED ORDER — LIDOCAINE HCL (CARDIAC) PF 100 MG/5ML IV SOSY
PREFILLED_SYRINGE | INTRAVENOUS | Status: DC | PRN
  Administered 2018-09-26: 50 mg via INTRAVENOUS

## 2018-09-26 MED ORDER — LIDOCAINE HCL (PF) 2 % IJ SOLN
INTRAMUSCULAR | Status: AC
  Filled 2018-09-26: qty 20

## 2018-09-26 MED ORDER — GLYCOPYRROLATE 0.2 MG/ML IJ SOLN
INTRAMUSCULAR | Status: DC | PRN
  Administered 2018-09-26: 0.2 mg via INTRAVENOUS

## 2018-09-26 MED ORDER — PROPOFOL 500 MG/50ML IV EMUL
INTRAVENOUS | Status: DC | PRN
  Administered 2018-09-26: 120 ug/kg/min via INTRAVENOUS

## 2018-09-26 NOTE — Anesthesia Post-op Follow-up Note (Signed)
Anesthesia QCDR form completed.        

## 2018-09-26 NOTE — Interval H&P Note (Signed)
History and Physical Interval Note:  09/26/2018 8:16 AM  Leslie [name]  has presented today for surgery, with the diagnosis of Iron deficiency anemia D50.0.  The various methods of treatment have been discussed with the patient and family. After consideration of risks, benefits and other options for treatment, the patient has consented to  Procedure(s): COLONOSCOPY WITH PROPOFOL (N/A) ESOPHAGOGASTRODUODENOSCOPY (EGD) WITH PROPOFOL (N/A) as a surgical intervention.  The patient's history has been reviewed, patient examined, no change in status, stable for surgery.  I have reviewed the patient's chart and labs.  Questions were answered to the patient's satisfaction.     Dakwon Wenberg [employer]

## 2018-09-26 NOTE — Transfer of Care (Signed)
Immediate Anesthesia Transfer of Care Note  Patient: Leslie [name]  Procedure(s) Performed: COLONOSCOPY WITH PROPOFOL (N/A ) ESOPHAGOGASTRODUODENOSCOPY (EGD) WITH PROPOFOL (N/A )  Patient Location: Endoscopy Unit  Anesthesia Type:General  Level of Consciousness: sedated and responds to stimulation  Airway & Oxygen Therapy: Patient Spontanous Breathing and Patient connected to nasal cannula oxygen  Post-op Assessment: Report given to RN and Post -op Vital signs reviewed and stable  Post vital signs: Reviewed and stable  Last Vitals:  Vitals Value Taken Time  BP 140/80 09/26/18 0843  Temp 36.6 C 09/26/18 0843  Pulse 88 09/26/18 0844  Resp 23 09/26/18 0844  SpO2 92 % 09/26/18 0844  Vitals shown include unvalidated device data.  Last Pain:  Vitals:   09/26/18 0843  TempSrc: Tympanic  PainSc:          Complications: No apparent anesthesia complications

## 2018-09-26 NOTE — Anesthesia Preprocedure Evaluation (Signed)
Anesthesia Evaluation  Patient identified by MRN, date of birth, ID band Patient awake    Reviewed: Allergy & Precautions, NPO status , Patient's Chart, lab work & pertinent test results  Airway Mallampati: III       Dental   Pulmonary pneumonia, resolved, Patient abstained from smoking., former smoker,    Pulmonary exam normal        Cardiovascular hypertension, + CAD and +CHF  Normal cardiovascular exam     Neuro/Psych PSYCHIATRIC DISORDERS Anxiety Depression    GI/Hepatic GERD  ,  Endo/Other    Renal/GU Renal disease     Musculoskeletal  (+) Arthritis ,   Abdominal Normal abdominal exam  (+)   Peds negative pediatric ROS (+)  Hematology  (+) anemia ,   Anesthesia Other Findings   Reproductive/Obstetrics                             Anesthesia Physical  Anesthesia Plan  ASA: III  Anesthesia Plan: General   Post-op Pain Management:    Induction: Intravenous  PONV Risk Score and Plan: Propofol infusion  Airway Management Planned: Nasal Cannula  Additional Equipment:   Intra-op Plan:   Post-operative Plan:   Informed Consent: I have reviewed the patients History and Physical, chart, labs and discussed the procedure including the risks, benefits and alternatives for the proposed anesthesia with the patient or authorized representative who has indicated his/her understanding and acceptance.     Dental advisory given  Plan Discussed with: CRNA  Anesthesia Plan Comments:         Anesthesia Quick Evaluation

## 2018-09-26 NOTE — Op Note (Signed)
[facility] Gastroenterology Patient Name: Leslie [name] Procedure Date: 09/26/2018 8:07 AM MRN: [id number] Account #: [id number] Date of Birth: 1965-10-08 Admit Type: Outpatient Age: 53 Room: [facility] ENDO ROOM 4 Gender: Female Note Status: Finalized Procedure:            Upper GI endoscopy Indications:          Iron deficiency anemia Providers:            Lucilla Lame MD, MD Referring MD:         Leretha Pol Medicines:            Propofol per Anesthesia Complications:        No immediate complications. Procedure:            Pre-Anesthesia Assessment:                       - Prior to the procedure, a History and Physical was                        performed, and patient medications and allergies were                        reviewed. The patient's tolerance of previous                        anesthesia was also reviewed. The risks and benefits of                        the procedure and the sedation options and risks were                        discussed with the patient. All questions were                        answered, and informed consent was obtained. Prior                        Anticoagulants: The patient has taken no previous                        anticoagulant or antiplatelet agents. ASA Grade                        Assessment: II - A patient with mild systemic disease.                        After reviewing the risks and benefits, the patient was                        deemed in satisfactory condition to undergo the                        procedure.                       After obtaining informed consent, the endoscope was                        passed under direct vision. Throughout the procedure,  the patient's blood pressure, pulse, and oxygen                        saturations were monitored continuously. The Endoscope                        was introduced through the mouth, and advanced to the                        second part  of duodenum. The upper GI endoscopy was                        accomplished without difficulty. The patient tolerated                        the procedure well. Findings:      The esophagus was normal.      The stomach was normal.      The examined duodenum was normal. Impression:           - Normal esophagus.                       - Normal stomach.                       - Normal examined duodenum.                       - No specimens collected. Recommendation:       - Discharge patient to home.                       - Resume previous diet.                       - Continue present medications.                       - Perform a colonoscopy today. Procedure Code(s):    --- Professional ---                       [phone], Esophagogastroduodenoscopy, flexible, transoral;                        diagnostic, including collection of specimen(s) by                        brushing or washing, when performed (separate procedure) Diagnosis Code(s):    --- Professional ---                       D50.9, Iron deficiency anemia, unspecified CPT copyright 2019 American Medical Association. All rights reserved. The codes documented in this report are preliminary and upon coder review may  be revised to meet current compliance requirements. Lucilla Lame MD, MD 09/26/2018 8:28:04 AM This report has been signed electronically. Number of Addenda: 0 Note Initiated On: 09/26/2018 8:07 AM Estimated Blood Loss: Estimated blood loss: none.      [facility]

## 2018-09-26 NOTE — Anesthesia Postprocedure Evaluation (Signed)
Anesthesia Post Note  Patient: Leslie [name]  Procedure(s) Performed: COLONOSCOPY WITH PROPOFOL (N/A ) ESOPHAGOGASTRODUODENOSCOPY (EGD) WITH PROPOFOL (N/A )  Anesthesia Type: General Level of consciousness: awake and alert and oriented Pain management: pain level controlled Vital Signs Assessment: post-procedure vital signs reviewed and stable Respiratory status: spontaneous breathing Cardiovascular status: blood pressure returned to baseline Postop Assessment: no headache Anesthetic complications: no     Last Vitals:  Vitals:   09/26/18 0853 09/26/18 0904  BP: 140/85 121/71  Pulse: 87 87  Resp: 17 20  Temp:    SpO2: 100% 100%    Last Pain:  Vitals:   09/26/18 0904  TempSrc:   PainSc: 0-No pain                 Kirin Pastorino

## 2018-09-26 NOTE — Op Note (Signed)
[facility] Gastroenterology Patient Name: Leslie [name] Procedure Date: 09/26/2018 8:04 AM MRN: [id number] Account #: [id number] Date of Birth: 1965-01-31 Admit Type: Outpatient Age: 53 Room: [facility] ENDO ROOM 4 Gender: Female Note Status: Finalized Procedure:            Colonoscopy Indications:          Iron deficiency anemia Providers:            Leretha Pol Medicines:            Propofol per Anesthesia Complications:        No immediate complications. Procedure:            Pre-Anesthesia Assessment:                       - Prior to the procedure, a History and Physical was                        performed, and patient medications and allergies were                        reviewed. The patient's tolerance of previous                        anesthesia was also reviewed. The risks and benefits of                        the procedure and the sedation options and risks were                        discussed with the patient. All questions were                        answered, and informed consent was obtained. Prior                        Anticoagulants: The patient has taken no previous                        anticoagulant or antiplatelet agents. ASA Grade                        Assessment: II - A patient with mild systemic disease.                        After reviewing the risks and benefits, the patient was                        deemed in satisfactory condition to undergo the                        procedure.                       After obtaining informed consent, the colonoscope was                        passed under direct vision. Throughout the procedure,                        the patient's blood pressure, pulse, and oxygen  saturations were monitored continuously. The                        Colonoscope was introduced through the anus and                        advanced to the the cecum, identified by appendiceal    orifice and ileocecal valve. The colonoscopy was                        performed without difficulty. The patient tolerated the                        procedure well. The quality of the bowel preparation                        was good. Findings:      The perianal and digital rectal examinations were normal.      A 7 mm polyp was found in the descending colon. The polyp was       pedunculated. The polyp was removed with a cold snare. Resection and       retrieval were complete.      A few small-mouthed diverticula were found in the sigmoid colon.      Non-bleeding internal hemorrhoids were found during retroflexion. The       hemorrhoids were Grade I (internal hemorrhoids that do not prolapse). Impression:           - One 7 mm polyp in the descending colon, removed with                        a cold snare. Resected and retrieved.                       - Diverticulosis in the sigmoid colon.                       - Non-bleeding internal hemorrhoids. Recommendation:       - Discharge patient to home.                       - Resume previous diet.                       - Continue present medications.                       - Await pathology results.                       - Repeat colonoscopy in 5 years if polyp adenoma and 10                        years if hyperplastic                       - To visualize the small bowel, perform video capsule                        endoscopy at appointment to be scheduled.                       -  May restart coumaduin in 2 days. Procedure Code(s):    --- Professional ---                       [phone], Colonoscopy, flexible; with removal of tumor(s),                        polyp(s), or other lesion(s) by snare technique Diagnosis Code(s):    --- Professional ---                       D50.9, Iron deficiency anemia, unspecified                       K63.5, Polyp of colon CPT copyright 2019 American Medical Association. All rights reserved. The codes  documented in this report are preliminary and upon coder review may  be revised to meet current compliance requirements. Lucilla Lame MD, MD 09/26/2018 8:43:10 AM This report has been signed electronically. Number of Addenda: 0 Note Initiated On: 09/26/2018 8:04 AM Scope Withdrawal Time: 0 hours 6 minutes 21 seconds  Total Procedure Duration: 0 hours 10 minutes 4 seconds  Estimated Blood Loss: Estimated blood loss: none.      [facility]

## 2018-09-27 ENCOUNTER — Encounter: Payer: Self-pay | Admitting: Gastroenterology

## 2018-09-27 LAB — MICROSCOPIC EXAMINATION

## 2018-09-27 LAB — UA/M W/RFLX CULTURE, ROUTINE
Bilirubin, UA: NEGATIVE
Glucose, UA: NEGATIVE
Ketones, UA: NEGATIVE
Leukocytes,UA: NEGATIVE
Nitrite, UA: NEGATIVE
Protein,UA: NEGATIVE
RBC, UA: NEGATIVE
Specific Gravity, UA: 1.012 (ref 1.005–1.030)
Urobilinogen, Ur: 0.2 mg/dL (ref 0.2–1.0)
pH, UA: 6 (ref 5.0–7.5)

## 2018-09-27 LAB — SURGICAL PATHOLOGY

## 2018-09-27 LAB — URINE CULTURE, REFLEX

## 2018-09-27 NOTE — Progress Notes (Signed)
Hey. Urine from he physical indicated urinary tract infection present. Can you find out if she is still on antibiotics? Thanks.

## 2018-09-28 ENCOUNTER — Telehealth: Payer: Self-pay | Admitting: Gastroenterology

## 2018-09-28 ENCOUNTER — Other Ambulatory Visit: Payer: Self-pay

## 2018-09-28 ENCOUNTER — Telehealth: Payer: Self-pay

## 2018-09-28 DIAGNOSIS — D500 Iron deficiency anemia secondary to blood loss (chronic): Secondary | ICD-10-CM

## 2018-09-28 MED ORDER — CLARITHROMYCIN 500 MG PO TABS: 500.0000 mg | ORAL_TABLET | Freq: Two times a day (BID) | ORAL | 0 refills | Status: DC

## 2018-09-28 MED ORDER — AMOXICILLIN 500 MG PO CAPS: 1000.0000 mg | ORAL_CAPSULE | Freq: Two times a day (BID) | ORAL | 0 refills | Status: DC

## 2018-09-28 MED ORDER — FLUCONAZOLE 150 MG PO TABS: ORAL_TABLET | ORAL | 0 refills | Status: DC

## 2018-09-28 NOTE — Telephone Encounter (Signed)
-----   Message from Lucilla Lame, MD sent at 09/21/2018  1:47 PM EDT ----- Let the patient know that her blood test for celiac sprue was negative but she was found to have a positive H. pylori infection.  The patient should be treated for her H. pylori and retested in 6 weeks to document eradication.

## 2018-09-28 NOTE — Telephone Encounter (Signed)
Pt notified of colonoscopy results. Pt scheduled for a capsule endoscopy at [facility] on 10/20/18.

## 2018-09-28 NOTE — Telephone Encounter (Signed)
Pt is returning a call  

## 2018-09-28 NOTE — Telephone Encounter (Signed)
Pt notified of lab results

## 2018-09-28 NOTE — Telephone Encounter (Signed)
-----   Message from Lucilla Lame, MD sent at 09/27/2018  4:33 PM EDT ----- Let the patient know the polyp was benign. Next colonoscopy in 10 years. She needs a capsule endoscopy due to her anemia.

## 2018-09-29 ENCOUNTER — Telehealth: Payer: Self-pay

## 2018-09-29 NOTE — Telephone Encounter (Signed)
-----   Message from Ronnell Freshwater, NP sent at 09/27/2018 10:53 AM EDT ----- Leslie [name]. Urine from he physical indicated urinary tract infection present. Can you find out if she is still on antibiotics? Thanks.

## 2018-09-29 NOTE — Telephone Encounter (Signed)
Try to call tpt three times voice mail full

## 2018-10-01 DIAGNOSIS — N12 Tubulo-interstitial nephritis, not specified as acute or chronic: Secondary | ICD-10-CM | POA: Insufficient documentation

## 2018-10-01 DIAGNOSIS — D649 Anemia, unspecified: Secondary | ICD-10-CM | POA: Insufficient documentation

## 2018-10-02 ENCOUNTER — Ambulatory Visit: Payer: Self-pay | Admitting: Gastroenterology

## 2018-10-02 ENCOUNTER — Other Ambulatory Visit: Payer: Self-pay

## 2018-10-02 DIAGNOSIS — K219 Gastro-esophageal reflux disease without esophagitis: Secondary | ICD-10-CM

## 2018-10-02 MED ORDER — ESTRADIOL 0.5 MG PO TABS: 0.5000 mg | ORAL_TABLET | Freq: Every day | ORAL | 1 refills | Status: DC

## 2018-10-02 MED ORDER — OMEPRAZOLE 40 MG PO CPDR: 40.0000 mg | DELAYED_RELEASE_CAPSULE | Freq: Every day | ORAL | 1 refills | Status: DC

## 2018-10-03 ENCOUNTER — Encounter: Payer: Self-pay | Admitting: Nurse Practitioner

## 2018-10-03 ENCOUNTER — Telehealth: Payer: Self-pay

## 2018-10-03 NOTE — Telephone Encounter (Signed)
lmom that amoxicillin pres by GI can covered UTI so she don't need more med

## 2018-10-03 NOTE — Telephone Encounter (Signed)
-----   Message from Ronnell Freshwater, NP sent at 09/27/2018 10:53 AM EDT ----- Leslie [name]. Urine from he physical indicated urinary tract infection present. Can you find out if she is still on antibiotics? Thanks.

## 2018-10-10 ENCOUNTER — Telehealth: Payer: Self-pay

## 2018-10-10 NOTE — Telephone Encounter (Signed)
Advised patient paperwork is ready for pick up copy and scan beth r.

## 2018-10-11 ENCOUNTER — Telehealth: Payer: Self-pay | Admitting: Cardiovascular Disease

## 2018-10-11 DIAGNOSIS — I5032 Chronic diastolic (congestive) heart failure: Secondary | ICD-10-CM

## 2018-10-11 DIAGNOSIS — Z79899 Other long term (current) drug therapy: Secondary | ICD-10-CM

## 2018-10-11 NOTE — Telephone Encounter (Signed)
Spoke with the patient. Patient complains of increased LE swelling over the last 2-3 weeks. Patient denies sob,orthopnea or PND. She does not weigh her self daily. She is currently taking Laisx 40mg  bid. Patient sts that she has been wearing compression stockings daily and elevates her legs when she can. Patient sts that her left ankle is a little more swollen then the right. She denies injury to her left leg. Her legs are not red or warm to touch, there is no weeping. Patient does sts that her feet are "sore."  Adv the patient to continue with elevation and to continue wearing her compression stockings daily. Limit her sodium intake.  Adv the patient that I will fwd the message to [place] for further recommendation.  Patient agreeable with the plan.

## 2018-10-11 NOTE — Telephone Encounter (Signed)
Pt c/o swelling: STAT is pt has developed SOB within 24 hours  1) How much weight have you gained and in what time span? approx 2-3 in a week  2) If swelling, where is the swelling located? Calves, ankles and feet. Left Is worse than right  3) Are you currently taking a fluid pill? yes  4) Are you currently SOB? no  5) Do you have a log of your daily weights (if so, list)? no  6) Have you gained 3 pounds in a day or 5 pounds in a week? no  7) Have you traveled recently? no

## 2018-10-12 NOTE — Telephone Encounter (Signed)
Would change lasix to torsemide 40 mg in the Am and 20 mg 2 pm Repeat BMP in 2 weeks after starting Stay on potassium

## 2018-10-13 MED ORDER — TORSEMIDE 20 MG PO TABS: ORAL_TABLET | ORAL | 2 refills | Status: DC

## 2018-10-13 NOTE — Telephone Encounter (Signed)
No answer. Left message to call back.   

## 2018-10-13 NOTE — Telephone Encounter (Signed)
Patient calling back. She verbalized understanding to: 1- stop furosemide. 2- start torsemide 40 mg in the morning and 20 mg at 2 pm. 3- BMP at [place] in 2 weeks around 10/27/18.  Rx sent to pharmacy and med list updated.

## 2018-10-13 NOTE — Telephone Encounter (Signed)
Attempted to call patient. LMTCB 10/13/2018

## 2018-10-13 NOTE — Telephone Encounter (Signed)
Patient is returning your call.  

## 2018-10-16 ENCOUNTER — Other Ambulatory Visit: Payer: Self-pay

## 2018-10-16 ENCOUNTER — Ambulatory Visit (INDEPENDENT_AMBULATORY_CARE_PROVIDER_SITE_OTHER): Payer: Managed Care, Other (non HMO)

## 2018-10-16 DIAGNOSIS — Z5181 Encounter for therapeutic drug level monitoring: Secondary | ICD-10-CM

## 2018-10-16 DIAGNOSIS — I050 Rheumatic mitral stenosis: Secondary | ICD-10-CM | POA: Diagnosis not present

## 2018-10-16 DIAGNOSIS — I4891 Unspecified atrial fibrillation: Secondary | ICD-10-CM

## 2018-10-16 LAB — POCT INR: INR: 4.1 — AB (ref 2.0–3.0)

## 2018-10-16 NOTE — Patient Instructions (Signed)
Please skip warfarin tonight, have a large serving of greens today, then continue of 1 tablet (2.5 mg) every day EXCEPT 1/2 TABLET ON MONDAYS, [place]. Recheck in 3 weeks.

## 2018-10-20 ENCOUNTER — Encounter: Payer: Self-pay | Admitting: Nurse Practitioner

## 2018-10-25 DIAGNOSIS — N183 Chronic kidney disease, stage 3 unspecified: Secondary | ICD-10-CM | POA: Insufficient documentation

## 2018-10-25 DIAGNOSIS — K219 Gastro-esophageal reflux disease without esophagitis: Secondary | ICD-10-CM | POA: Insufficient documentation

## 2018-10-25 DIAGNOSIS — I509 Heart failure, unspecified: Secondary | ICD-10-CM | POA: Insufficient documentation

## 2018-11-03 ENCOUNTER — Encounter
Admission: RE | Disposition: A | Discharge status: Home / Self Care | Payer: Self-pay | Source: Home / Self Care | Attending: Gastroenterology

## 2018-11-03 ENCOUNTER — Encounter: Payer: Self-pay | Admitting: Certified Registered"

## 2018-11-03 ENCOUNTER — Ambulatory Visit
Admission: RE | Admit: 2018-11-03 | Discharge: 2018-11-03 | Disposition: A | Discharge status: Home / Self Care | Payer: Managed Care, Other (non HMO) | Attending: Gastroenterology | Admitting: Gastroenterology

## 2018-11-03 DIAGNOSIS — D509 Iron deficiency anemia, unspecified: Secondary | ICD-10-CM

## 2018-11-03 DIAGNOSIS — D649 Anemia, unspecified: Secondary | ICD-10-CM | POA: Diagnosis present

## 2018-11-03 HISTORY — PX: GIVENS CAPSULE STUDY: SHX5432

## 2018-11-03 SURGERY — IMAGING PROCEDURE, GI TRACT, INTRALUMINAL, VIA CAPSULE

## 2018-11-03 MED ORDER — LIDOCAINE HCL (PF) 2 % IJ SOLN
INTRAMUSCULAR | Status: AC
  Filled 2018-11-03: qty 10

## 2018-11-03 MED ORDER — PROPOFOL 500 MG/50ML IV EMUL
INTRAVENOUS | Status: AC
  Filled 2018-11-03: qty 50

## 2018-11-06 ENCOUNTER — Other Ambulatory Visit: Payer: Self-pay | Admitting: Nurse Practitioner

## 2018-11-06 ENCOUNTER — Encounter: Payer: Self-pay | Admitting: Gastroenterology

## 2018-11-06 DIAGNOSIS — F419 Anxiety disorder, unspecified: Secondary | ICD-10-CM

## 2018-11-06 MED ORDER — ALPRAZOLAM 0.5 MG PO TABS: 0.5000 mg | ORAL_TABLET | Freq: Every evening | ORAL | 2 refills | Status: DC | PRN

## 2018-11-06 NOTE — Progress Notes (Signed)
Renewed alprazolam 0.5mg  at bedtime as needed for insomnia related to anxiety per pharmacy request.

## 2018-11-13 ENCOUNTER — Other Ambulatory Visit
Admission: RE | Admit: 2018-11-13 | Discharge: 2018-11-13 | Disposition: A | Discharge status: Home / Self Care | Payer: Managed Care, Other (non HMO) | Source: Ambulatory Visit | Attending: Cardiovascular Disease | Admitting: Cardiovascular Disease

## 2018-11-13 ENCOUNTER — Telehealth: Payer: Self-pay

## 2018-11-13 ENCOUNTER — Other Ambulatory Visit: Payer: Self-pay

## 2018-11-13 ENCOUNTER — Ambulatory Visit (INDEPENDENT_AMBULATORY_CARE_PROVIDER_SITE_OTHER): Payer: Managed Care, Other (non HMO)

## 2018-11-13 DIAGNOSIS — Z5181 Encounter for therapeutic drug level monitoring: Secondary | ICD-10-CM

## 2018-11-13 DIAGNOSIS — I4891 Unspecified atrial fibrillation: Secondary | ICD-10-CM

## 2018-11-13 DIAGNOSIS — I5032 Chronic diastolic (congestive) heart failure: Secondary | ICD-10-CM

## 2018-11-13 DIAGNOSIS — Z79899 Other long term (current) drug therapy: Secondary | ICD-10-CM | POA: Diagnosis present

## 2018-11-13 DIAGNOSIS — I050 Rheumatic mitral stenosis: Secondary | ICD-10-CM | POA: Diagnosis present

## 2018-11-13 LAB — BASIC METABOLIC PANEL
Anion gap: 11 (ref 5–15)
BUN: 20 mg/dL (ref 6–20)
CO2: 30 mmol/L (ref 22–32)
Calcium: 8.5 mg/dL — ABNORMAL LOW (ref 8.9–10.3)
Chloride: 96 mmol/L — ABNORMAL LOW (ref 98–111)
Creatinine, Ser: 1.41 mg/dL — ABNORMAL HIGH (ref 0.44–1.00)
GFR calc Af Amer: 50 mL/min — ABNORMAL LOW (ref >60)
GFR calc non Af Amer: 43 mL/min — ABNORMAL LOW (ref >60)
Glucose, Bld: 106 mg/dL — ABNORMAL HIGH (ref 70–99)
Potassium: 2.9 mmol/L — ABNORMAL LOW (ref 3.5–5.1)
Sodium: 137 mmol/L (ref 135–145)

## 2018-11-13 LAB — PROTIME-INR
INR: 4.8 (ref 0.8–1.2)
Prothrombin Time: 43.9 seconds — ABNORMAL HIGH (ref 11.4–15.2)

## 2018-11-13 LAB — POCT INR: INR: 6.9 — AB (ref 2.0–3.0)

## 2018-11-13 NOTE — Telephone Encounter (Signed)
[facility] calling with critical lab PT/INR result Transferred to [facility]

## 2018-11-13 NOTE — Telephone Encounter (Signed)
[facility] calling w/ PT 43.9, INR > 4.0. Please see anti-coag note for today.

## 2018-11-13 NOTE — Patient Instructions (Addendum)
Please proceed to the [place] for STAT labs. Please hold warfarin tonight and tomorrow, then resume dosage of 1 tablet every day except 1/2 tablet on Mondays, Wednesdays & Fridays.  Recheck in 1 week.

## 2018-11-14 DIAGNOSIS — I4891 Unspecified atrial fibrillation: Secondary | ICD-10-CM

## 2018-11-14 NOTE — Telephone Encounter (Signed)
Call to patient, no answer, left vm.   Sent information in [employer].   Asked patient to call if any further questions arise.   Lab order placed for [facility] in 1 week.

## 2018-11-14 NOTE — Telephone Encounter (Signed)
-----   Message from Rise Mu, PA-C sent at 11/13/2018  2:22 PM EDT ----- Renal function continues to improve. Potassium remains low. Our records indicate the patient takes KCl 20 mEq daily. Please have the patient take an additional 20 mEq, for a total of 40 mEq over the next 2 days.  Following this, she should resume KCl 20 mEq daily. Recheck BMP end of this week.

## 2018-11-14 NOTE — Telephone Encounter (Signed)
Incoming call from patient. Updated her on POC. Pt verbalized understanding and agreeable to POC>   1 week [facility] at [facility].  Advised pt to call for any further questions or concerns.

## 2018-11-22 ENCOUNTER — Other Ambulatory Visit: Payer: Self-pay

## 2018-11-22 ENCOUNTER — Telehealth: Payer: Self-pay

## 2018-11-22 ENCOUNTER — Other Ambulatory Visit
Admission: RE | Admit: 2018-11-22 | Discharge: 2018-11-22 | Disposition: A | Discharge status: Home / Self Care | Payer: Managed Care, Other (non HMO) | Source: Ambulatory Visit | Attending: Physician Assistant | Admitting: Physician Assistant

## 2018-11-22 ENCOUNTER — Ambulatory Visit (INDEPENDENT_AMBULATORY_CARE_PROVIDER_SITE_OTHER): Payer: Managed Care, Other (non HMO)

## 2018-11-22 DIAGNOSIS — R710 Precipitous drop in hematocrit: Secondary | ICD-10-CM | POA: Insufficient documentation

## 2018-11-22 DIAGNOSIS — I050 Rheumatic mitral stenosis: Secondary | ICD-10-CM

## 2018-11-22 DIAGNOSIS — Z5181 Encounter for therapeutic drug level monitoring: Secondary | ICD-10-CM

## 2018-11-22 DIAGNOSIS — I4891 Unspecified atrial fibrillation: Secondary | ICD-10-CM

## 2018-11-22 LAB — CBC
HCT: 33.9 % — ABNORMAL LOW (ref 36.0–46.0)
Hemoglobin: 11.2 g/dL — ABNORMAL LOW (ref 12.0–15.0)
MCH: 28.6 pg (ref 26.0–34.0)
MCHC: 33 g/dL (ref 30.0–36.0)
MCV: 86.5 fL (ref 80.0–100.0)
Platelets: 288 10*3/uL (ref 150–400)
RBC: 3.92 MIL/uL (ref 3.87–5.11)
RDW: 13.8 % (ref 11.5–15.5)
WBC: 6.2 10*3/uL (ref 4.0–10.5)
nRBC: 0 % (ref 0.0–0.2)

## 2018-11-22 LAB — BASIC METABOLIC PANEL
Anion gap: 13 (ref 5–15)
BUN: 21 mg/dL — ABNORMAL HIGH (ref 6–20)
CO2: 28 mmol/L (ref 22–32)
Calcium: 8.7 mg/dL — ABNORMAL LOW (ref 8.9–10.3)
Chloride: 98 mmol/L (ref 98–111)
Creatinine, Ser: 1.39 mg/dL — ABNORMAL HIGH (ref 0.44–1.00)
GFR calc Af Amer: 50 mL/min — ABNORMAL LOW (ref >60)
GFR calc non Af Amer: 43 mL/min — ABNORMAL LOW (ref >60)
Glucose, Bld: 104 mg/dL — ABNORMAL HIGH (ref 70–99)
Potassium: 2.9 mmol/L — ABNORMAL LOW (ref 3.5–5.1)
Sodium: 139 mmol/L (ref 135–145)

## 2018-11-22 LAB — POCT INR: INR: 5.4 — AB (ref 2.0–3.0)

## 2018-11-22 NOTE — Patient Instructions (Signed)
-  Have a large serving of greens today - hold warfarin tonight and tomorrow - On Friday, START NEW DOSAGE of warfarin 1/2 tablet every day EXCEPT 1 whole tablet on [place].  Recheck in 1.5 weeks.

## 2018-11-22 NOTE — Telephone Encounter (Signed)
Capsule study results: Normal study of small bowel No source of bleeding seen. Patient verbalized understanding

## 2018-11-23 ENCOUNTER — Encounter: Payer: Self-pay | Admitting: Gastroenterology

## 2018-11-23 ENCOUNTER — Telehealth: Payer: Self-pay

## 2018-11-23 DIAGNOSIS — Z79899 Other long term (current) drug therapy: Secondary | ICD-10-CM

## 2018-11-23 NOTE — Telephone Encounter (Signed)
-----   Message from Rise Mu, PA-C sent at 11/22/2018  3:01 PM EST ----- Renal function remains mildly elevated, though stable.  Potassium continues to be low.  Blood count is low, though stable.  Please have patient take an additional KCl 60 mEq x 2, for a total of 80 mEq those two days followed by 20 mEq daily thereafter.  Recheck BMET 11/9.

## 2018-11-23 NOTE — Telephone Encounter (Signed)
Call to patient, discussed results from recent labs and POC from Christell Faith, PA>   Pt agreeable to POC and will have lab repeat at the [facility] in 1 week.   Advised pt to call for any further questions or concerns.

## 2018-11-24 ENCOUNTER — Ambulatory Visit: Payer: Managed Care, Other (non HMO) | Admitting: Nurse Practitioner

## 2018-11-26 ENCOUNTER — Ambulatory Visit
Admission: EM | Admit: 2018-11-26 | Discharge: 2018-11-26 | Disposition: A | Discharge status: Home / Self Care | Payer: Managed Care, Other (non HMO) | Attending: Family Medicine | Admitting: Family Medicine

## 2018-11-26 ENCOUNTER — Ambulatory Visit (INDEPENDENT_AMBULATORY_CARE_PROVIDER_SITE_OTHER): Payer: Managed Care, Other (non HMO)

## 2018-11-26 ENCOUNTER — Encounter: Payer: Self-pay | Admitting: Emergency Medicine

## 2018-11-26 ENCOUNTER — Other Ambulatory Visit: Payer: Self-pay

## 2018-11-26 DIAGNOSIS — M25561 Pain in right knee: Secondary | ICD-10-CM

## 2018-11-26 DIAGNOSIS — M1711 Unilateral primary osteoarthritis, right knee: Secondary | ICD-10-CM | POA: Diagnosis not present

## 2018-11-26 MED ORDER — HYDROCODONE-ACETAMINOPHEN 5-325 MG PO TABS: ORAL_TABLET | ORAL | 0 refills | Status: DC

## 2018-11-26 MED ORDER — PREDNISONE 10 MG PO TABS: ORAL_TABLET | ORAL | 0 refills | Status: DC

## 2018-11-26 NOTE — ED Triage Notes (Signed)
Patient in today c/o right knee pain x 1 week. Patient states that the pain is so bad that she can't sleep. Patient tried to get an appointment with her orthopedist at [facility] and they didn't have any openings until Jan 2021. Patient has tried Tylenol without relief.

## 2018-11-26 NOTE — Discharge Instructions (Signed)
Rest, ice, elevation °

## 2018-11-26 NOTE — ED Provider Notes (Signed)
[facility]    CSN: [id number] Arrival date & time: 11/26/18  1252      History   Chief Complaint Chief Complaint  Patient presents with  . Knee Pain    right    HPI Leslie [name] is a 53 y.o. female.   54 yo female with a c/o right knee pain for 1 week. Denies any falls or other traumatic injury but states pain is severe and has had some swelling as well. Denies any fevers, chills, redness, rash.    Knee Pain   Past Medical History:  Diagnosis Date  . (HFpEF) heart failure with preserved ejection fraction ([place])    a. 08/2017 Echo: EF 55-60%.  Grade 2 diastolic dysfunction.  Moderate mitral stenosis.  . Allergy   . Anemia   . Anxiety   . BRCA negative 03/22/2013  . Bronchitis 02/19/2016   ON LEVAQUIN PO  . Chest tightness   . Cigarette smoker 09/11/2017   8-10 day  . GERD (gastroesophageal reflux disease)   . History of kidney stones   . Hyperlipidemia   . Hypertension   . Interstitial lung disease ([place])    a. CT 2013 b. 02/2018 CXR noted recurrent intersistial changes ILD vs chronic bronchitis  . Moderate mitral stenosis    a.  08/2017 TEE: EF 60 to 65%.  Moderate mitral stenosis.  Mean gradient 14 mmHg.  Valve area 2.59 cm by planimetry, 2.72 cm by pressure half-time.  . Persistent atrial fibrillation ([place])    a. 08/2017 s/p TEE/DCCV; b. CHA2DS2VASc = 2-->warfarin.    Patient Active Problem List   Diagnosis Date Noted  . Pyelonephritis 10/01/2018  . Absolute anemia 10/01/2018  . Iron deficiency anemia secondary to blood loss (chronic)   . Polyp of descending colon   . Acute on chronic heart failure with preserved ejection fraction (HFpEF) ([place])   . AKI (acute kidney injury) ([place])   . Sepsis ([place]) 08/26/2018  . (HFpEF) heart failure with preserved ejection fraction ([place])   . Renal calculus, right 07/03/2018  . Low back pain 06/27/2018  . Bacterial vaginitis 06/27/2018  . Pedal edema 06/27/2018  . Coronary artery disease, non-occlusive  04/10/2018  . Acute on chronic diastolic CHF (congestive heart failure) ([place]) 04/10/2018  . Encounter for other preprocedural examination 03/16/2018  . Community acquired pneumonia 02/16/2018  . Elevated brain natriuretic peptide (BNP) level 02/16/2018  . Abnormal renal function 02/16/2018  . Acute recurrent pansinusitis 01/13/2018  . Candidiasis 01/13/2018  . Anxiety 01/13/2018  . Osteoarthritis of knee 01/09/2018  . Morbid obesity with body mass index (BMI) of 40.0 or higher ([place]) 11/13/2017  . Major depressive disorder 09/22/2017  . Mitral valve stenosis   . Atrial fibrillation ([place]) 09/11/2017  . Generalized abdominal pain 09/08/2017  . Episode of recurrent major depressive disorder ([place]) 09/08/2017  . Hypertension 06/06/2017  . Cough 06/06/2017  . Tobacco dependency 06/06/2017  . Urinary tract infection without hematuria 04/28/2017  . Acute upper respiratory infection 04/28/2017  . Flu-like symptoms 04/28/2017  . Dysuria 04/28/2017  . Vaginal candidiasis 04/28/2017  . Pain of left lower extremity 02/11/2016  . 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 Fibrocystic breast disease 03/23/2013  . Family history of breast cancer 07/20/2012    Past Surgical History:  Procedure Laterality Date  . ABDOMINAL HYSTERECTOMY     total  . ABDOMINAL HYSTERECTOMY    . BREAST BIOPSY Bilateral 2012  . BREAST BIOPSY Right 08-07-12   fibroadenomatous changes and  columnar cells  . BREAST BIOPSY  02/03/2015   stereo byrnett  . CHOLECYSTECTOMY N/A 02/27/2016   Procedure: LAPAROSCOPIC CHOLECYSTECTOMY;  Surgeon: Christene Lye, MD;  Location: [facility];  Service: General;  Laterality: N/A;  . COLONOSCOPY WITH PROPOFOL N/A 09/26/2018   Procedure: COLONOSCOPY WITH PROPOFOL;  Surgeon: Lucilla Lame, MD;  Location: [facility] END[facility]COPY;  Service: Endoscopy;  Laterality: N/A;  . CYSTOSCOPY W/ RETROGRADES Right 08/18/2018   Procedure: CYSTOSCOPY WITH RETROGRADE PYELOGRAM;  Surgeon: Billey Co, MD;  Location: [facility];  Service: Urology;  Laterality: Right;  . CYSTOSCOPY/URETEROSCOPY/HOLMIUM LASER/STENT PLACEMENT Right 08/18/2018   Procedure: CYSTOSCOPY/URETEROSCOPY/STENT PLACEMENT;  Surgeon: Billey Co, MD;  Location: [facility];  Service: Urology;  Laterality: Right;  . ESOPHAGOGASTRODUODENOSCOPY (EGD) WITH PROPOFOL N/A 09/26/2018   Procedure: ESOPHAGOGASTRODUODENOSCOPY (EGD) WITH PROPOFOL;  Surgeon: Lucilla Lame, MD;  Location: [facility];  Service: Endoscopy;  Laterality: N/A;  . GIVENS CAPSULE STUDY N/A 11/03/2018   Procedure: GIVENS CAPSULE STUDY;  Surgeon: Lucilla Lame, MD;  Location: [facility];  Service: Endoscopy;  Laterality: N/A;  . JOINT REPLACEMENT Left   . KNEE CLOSED REDUCTION Left 04/15/2015   Procedure: CLOSED MANIPULATION KNEE;  Surgeon: Thornton Park, MD;  Location: [facility];  Service: Orthopedics;  Laterality: Left;  . KNEE SURGERY    . TEE WITHOUT CARDIOVERSION N/A 09/13/2017   Procedure: TRANSESOPHAGEAL ECHOCARDIOGRAM (TEE);  Surgeon: Nelva Bush, MD;  Location: [facility];  Service: Cardiovascular;  Laterality: N/A;  . TOTAL KNEE ARTHROPLASTY Left 12/25/2014   Procedure: TOTAL KNEE ARTHROPLASTY;  Surgeon: Thornton Park, MD;  Location: [facility];  Service: Orthopedics;  Laterality: Left;  . TUBAL LIGATION      OB History    Gravida  3   Para  3   Term      Preterm      AB      Living  3     SAB      TAB      Ectopic      Multiple      Live Births           Obstetric Comments  1st Menstrual Cycle:  13 1st Pregnancy:  18         Home Medications    Prior to Admission medications   Medication Sig Start Date End Date Taking? Authorizing Provider  albuterol (VENTOLIN HFA) 108 (90 Base) MCG/ACT inhaler Inhale 2 puffs into the lungs every 4 (four) hours as needed for wheezing or shortness of breath. 06/16/18  Yes Boscia, Greer Ee, NP  ALPRAZolam (XANAX) 0.5 MG tablet Take 1 tablet (0.5 mg total) by mouth at  bedtime as needed for anxiety. 11/06/18  Yes Ronnell Freshwater, NP  atorvastatin (LIPITOR) 80 MG tablet Take 1 tablet (80 mg total) by mouth daily at 6 PM. 09/19/18  Yes Dunn, Areta Haber, PA-C  diltiazem (CARDIZEM CD) 240 MG 24 hr capsule TAKE 1 CAPSULE DAILY 08/28/18  Yes Gollan, Kathlene November, MD  estradiol (ESTRACE) 0.5 MG tablet Take 1 tablet (0.5 mg total) by mouth daily. 10/02/18  Yes Boscia, Greer Ee, NP  metoprolol succinate (TOPROL-XL) 50 MG 24 hr tablet Take 1 tablet (50 mg total) by mouth daily. Take with or immediately following a meal. 09/19/18  Yes Dunn, Areta Haber, PA-C  omeprazole (PRILOSEC) 40 MG capsule Take 1 capsule (40 mg total) by mouth daily. 10/02/18  Yes Boscia, Heather E, NP  potassium chloride (K-DUR) 10 MEQ tablet Take 2 tablets (20 meq) by  mouth once daily Patient taking differently: Take 20 mEq by mouth daily.  04/10/18  Yes Minna Merritts, MD  torsemide (DEMADEX) 20 MG tablet Take 2 tablets (40 mg) by mouth in the morning and take 1 tablet (20 mg) by mouth at 2 pm. 10/13/18  Yes Gollan, Kathlene November, MD  warfarin (COUMADIN) 2.5 MG tablet TAKE 1/2 TO 1 TABLET DAILY AS DIRECTED BY COUMADIN CLINIC. Patient taking differently: Take 1.25-2.5 mg by mouth See admin instructions. Take 2.5 mg at night on Mon, Wed, and Fri.  Take 1.25 mg at night on Sun, Tue, Thur, and Sat 04/07/18  Yes Gollan, Kathlene November, MD  amoxicillin (AMOXIL) 500 MG capsule Take 2 capsules (1,000 mg total) by mouth 2 (two) times daily. 09/28/18   Lucilla Lame, MD  clarithromycin (BIAXIN) 500 MG tablet Take 1 tablet (500 mg total) by mouth 2 (two) times daily. 09/28/18   Lucilla Lame, MD  fluconazole (DIFLUCAN) 150 MG tablet Take 1 tablet now, then 1 three days later (once treatment completed) 09/28/18   Lucilla Lame, MD  HYDROcodone-acetaminophen (NORCO/VICODIN) 5-325 MG tablet 1-2 tabs po bid prn 11/26/18   Norval Gable, MD  montelukast (SINGULAIR) 10 MG tablet Take 1 tablet (10 mg total) by mouth at bedtime. 04/18/18   Ronnell Freshwater, NP  Na Sulfate-K Sulfate-Mg Sulf (SUPREP BOWEL PREP KIT) 17.5-3.13-1.6 GM/177ML SOLN Take 1 kit by mouth as directed. 09/19/18   Lucilla Lame, MD  predniSONE (DELTASONE) 10 MG tablet Start 60 mg po day one, then 50 mg po day two, taper by 10 mg daily until complete. 11/26/18   Norval Gable, MD    Family History Family History  Problem Relation Age of Onset  . Cancer Mother 69       breast  . Hypertension Mother   . Cancer Maternal Aunt        breast  . Cancer Maternal Grandmother        breast  . Osteoarthritis Father   . Hypertension Father     Social History Social History   Tobacco Use  . Smoking status: Former Smoker    Packs/day: 0.50    Years: 20.00    Pack years: 10.00    Types: Cigarettes    Quit date: 02/20/2018    Years since quitting: 0.7  . Smokeless tobacco: Never Used  . Tobacco comment: down to 2-3 daily  Substance Use Topics  . Alcohol use: Yes    Comment: rarely  . Drug use: No     Allergies   Morphine, Oxycodone hcl, and Dilaudid [hydromorphone hcl]   Review of Systems Review of Systems   Physical Exam Triage Vital Signs ED Triage Vitals  Enc Vitals Group     BP 11/26/18 1313 136/69     Pulse Rate 11/26/18 1313 77     Resp 11/26/18 1313 18     Temp 11/26/18 1313 98.1 F (36.7 C)     Temp Source 11/26/18 1313 Oral     SpO2 11/26/18 1313 99 %     Weight 11/26/18 1312 217 lb (98.4 kg)     Height 11/26/18 1312 '5\' 4"'  (1.626 m)     Head Circumference --      Peak Flow --      Pain Score 11/26/18 1312 10     Pain Loc --      Pain Edu? --      Excl. in [place]? --    No data found.  Updated Vital  Signs BP 136/69 (BP Location: Left Arm)   Pulse 77   Temp 98.1 F (36.7 C) (Oral)   Resp 18   Ht '5\' 4"'  (1.626 m)   Wt 98.4 kg   SpO2 99%   BMI 37.25 kg/m   Visual Acuity Right Eye Distance:   Left Eye Distance:   Bilateral Distance:    Right Eye Near:   Left Eye Near:    Bilateral Near:     Physical Exam Vitals signs and  nursing note reviewed.  Constitutional:      General: She is not in acute distress.    Appearance: She is not toxic-appearing or diaphoretic.  Musculoskeletal:     Right knee: She exhibits swelling (mild). She exhibits normal range of motion, no effusion, no ecchymosis, no deformity, no laceration, no erythema, normal alignment, no LCL laxity, normal meniscus and no MCL laxity. Tenderness (diffuse) found.  Neurological:     Mental Status: She is alert.      UC Treatments / Results  Labs (all labs ordered are listed, but only abnormal results are displayed) Labs Reviewed - No data to display  EKG   Radiology Dg Knee Complete 4 Views Right  Result Date: 11/26/2018 CLINICAL DATA:  Pain, no injury EXAM: RIGHT KNEE - COMPLETE 4+ VIEW COMPARISON:  None. FINDINGS: No fracture or dislocation of the right knee. There is focally moderate joint space narrowing of the medial compartment with mild osteophytosis. The remaining compartments are preserved. No knee joint effusion. Soft tissues are unremarkable. IMPRESSION: No fracture or dislocation of the right knee. There is focally moderate joint space narrowing of the medial compartment with mild osteophytosis. The remaining compartments are preserved. No knee joint effusion. Electronically Signed   By: Eddie Candle M.D.   On: 11/26/2018 14:05    Procedures Procedures (including critical care time)  Medications Ordered in UC Medications - No data to display  Initial Impression / Assessment and Plan / UC Course  I have reviewed the triage vital signs and the nursing notes.  Pertinent labs & imaging results that were available during my care of the patient were reviewed by me and considered in my medical decision making (see chart for details).      Final Clinical Impressions(s) / UC Diagnoses   Final diagnoses:  Acute pain of right knee  Osteoarthritis of right knee, unspecified osteoarthritis type     Discharge Instructions      Rest, ice, elevation    ED Prescriptions    Medication Sig Dispense Auth. Provider   predniSONE (DELTASONE) 10 MG tablet Start 60 mg po day one, then 50 mg po day two, taper by 10 mg daily until complete. 21 tablet Norval Gable, MD   HYDROcodone-acetaminophen (NORCO/VICODIN) 5-325 MG tablet 1-2 tabs po bid prn 6 tablet Norval Gable, MD      1. X-ray results and diagnosis reviewed with patient 2. rx as per orders above; reviewed possible side effects, interactions, risks and benefits  3. Recommend supportive treatment as above 4. Follow-up prn if symptoms worsen or don't improve  I have reviewed the PDMP during this encounter.   Norval Gable, MD 11/26/18 1759

## 2018-11-27 ENCOUNTER — Encounter: Payer: Self-pay | Admitting: Nurse Practitioner

## 2018-11-27 ENCOUNTER — Ambulatory Visit: Payer: Managed Care, Other (non HMO) | Admitting: Nurse Practitioner

## 2018-11-27 VITALS — BP 131/70 | HR 67 | Temp 97.4°F | Resp 16 | Ht 64.0 in | Wt 249.0 lb

## 2018-11-27 DIAGNOSIS — N760 Acute vaginitis: Secondary | ICD-10-CM | POA: Diagnosis not present

## 2018-11-27 DIAGNOSIS — B373 Candidiasis of vulva and vagina: Secondary | ICD-10-CM | POA: Diagnosis not present

## 2018-11-27 DIAGNOSIS — M25561 Pain in right knee: Secondary | ICD-10-CM | POA: Diagnosis not present

## 2018-11-27 DIAGNOSIS — R300 Dysuria: Secondary | ICD-10-CM

## 2018-11-27 DIAGNOSIS — B3731 Acute candidiasis of vulva and vagina: Secondary | ICD-10-CM

## 2018-11-27 DIAGNOSIS — B9689 Other specified bacterial agents as the cause of diseases classified elsewhere: Secondary | ICD-10-CM

## 2018-11-27 LAB — POCT URINALYSIS DIPSTICK
Bilirubin, UA: NEGATIVE
Blood, UA: NEGATIVE
Glucose, UA: NEGATIVE
Ketones, UA: NEGATIVE
Leukocytes, UA: NEGATIVE
Nitrite, UA: NEGATIVE
Protein, UA: NEGATIVE
Spec Grav, UA: 1.01 (ref 1.010–1.025)
Urobilinogen, UA: 0.2 E.U./dL
pH, UA: 6 (ref 5.0–8.0)

## 2018-11-27 MED ORDER — FLUCONAZOLE 150 MG PO TABS: ORAL_TABLET | ORAL | 0 refills | Status: DC

## 2018-11-27 MED ORDER — METRONIDAZOLE 0.75 % VA GEL: 1.0000 | Freq: Every day | VAGINAL | 0 refills | Status: DC

## 2018-11-27 NOTE — Progress Notes (Signed)
[facility] [place], [place] [address]  Internal MEDICINE  Office Visit Note  Patient Name: Leslie [name]  [id number]  [id number]  Date of Service: 11/27/2018  Chief Complaint  Patient presents with  . Urinary Tract Infection    burning, lower back pain   . Knee Pain    right knee, pain is radiating down to pt ankle     The patient is here for acute visit.  Today, she is c/o low back pain. She is concerned about continued urinary tract infection. She has had multiple stones with stent placement and infection requiring hospitalization. She states that she has vaginal discharge with itching and irritation. She states that discharge does have an odor to it.Today, urine sample is negative for acute abnormalities.  She states she has torn meniscus. Was scheduled to have surgery when she ended up in hospital due to severe pyelonephritis. She sees her orthopedic provider again at the end of next month for reassessment. She went to urgent care yesterday due to the pain being so severe in her knee. She was given a prednisone taper for six days as well as hydrocodone to help with pain. She states that she has not started either of these yesterday.       Current Medication: Outpatient Encounter Medications as of 11/27/2018  Medication Sig  . albuterol (VENTOLIN HFA) 108 (90 Base) MCG/ACT inhaler Inhale 2 puffs into the lungs every 4 (four) hours as needed for wheezing or shortness of breath.  . ALPRAZolam (XANAX) 0.5 MG tablet Take 1 tablet (0.5 mg total) by mouth at bedtime as needed for anxiety.  Marland Kitchen atorvastatin (LIPITOR) 80 MG tablet Take 1 tablet (80 mg total) by mouth daily at 6 PM.  . diltiazem (CARDIZEM CD) 240 MG 24 hr capsule TAKE 1 CAPSULE DAILY  . estradiol (ESTRACE) 0.5 MG tablet Take 1 tablet (0.5 mg total) by mouth daily.  Marland Kitchen HYDROcodone-acetaminophen (NORCO/VICODIN) 5-325 MG tablet 1-2 tabs po bid prn  . metoprolol succinate (TOPROL-XL) 50 MG 24 hr tablet  Take 1 tablet (50 mg total) by mouth daily. Take with or immediately following a meal.  . montelukast (SINGULAIR) 10 MG tablet Take 1 tablet (10 mg total) by mouth at bedtime.  . Na Sulfate-K Sulfate-Mg Sulf (SUPREP BOWEL PREP KIT) 17.5-3.13-1.6 GM/177ML SOLN Take 1 kit by mouth as directed.  Marland Kitchen omeprazole (PRILOSEC) 40 MG capsule Take 1 capsule (40 mg total) by mouth daily.  . potassium chloride (K-DUR) 10 MEQ tablet Take 2 tablets (20 meq) by mouth once daily (Patient taking differently: Take 20 mEq by mouth daily. )  . predniSONE (DELTASONE) 10 MG tablet Start 60 mg po day one, then 50 mg po day two, taper by 10 mg daily until complete.  . torsemide (DEMADEX) 20 MG tablet Take 2 tablets (40 mg) by mouth in the morning and take 1 tablet (20 mg) by mouth at 2 pm.  . warfarin (COUMADIN) 2.5 MG tablet TAKE 1/2 TO 1 TABLET DAILY AS DIRECTED BY COUMADIN CLINIC. (Patient taking differently: Take 1.25-2.5 mg by mouth See admin instructions. Take 2.5 mg at night on Mon, Wed, and Fri.  Take 1.25 mg at night on Sun, Tue, Thur, and Sat)  . fluconazole (DIFLUCAN) 150 MG tablet Take 1 tablet po once. May repeat dose in 3 days as needed for persistent symptoms.  . metroNIDAZOLE (METROGEL) 0.75 % vaginal gel Place 1 Applicatorful vaginally at bedtime.  . [DISCONTINUED] amoxicillin (AMOXIL) 500 MG capsule Take 2  capsules (1,000 mg total) by mouth 2 (two) times daily. (Patient not taking: Reported on 11/27/2018)  . [DISCONTINUED] clarithromycin (BIAXIN) 500 MG tablet Take 1 tablet (500 mg total) by mouth 2 (two) times daily. (Patient not taking: Reported on 11/27/2018)  . [DISCONTINUED] fluconazole (DIFLUCAN) 150 MG tablet Take 1 tablet now, then 1 three days later (once treatment completed) (Patient not taking: Reported on 11/27/2018)   No facility-administered encounter medications on file as of 11/27/2018.     Surgical History: Past Surgical History:  Procedure Laterality Date  . ABDOMINAL HYSTERECTOMY     total   . ABDOMINAL HYSTERECTOMY    . BREAST BIOPSY Bilateral 2012  . BREAST BIOPSY Right 08-07-12   fibroadenomatous changes and columnar cells  . BREAST BIOPSY  02/03/2015   stereo byrnett  . CHOLECYSTECTOMY N/A 02/27/2016   Procedure: LAPAROSCOPIC CHOLECYSTECTOMY;  Surgeon: Christene Lye, MD;  Location: [facility] ORS;  Service: General;  Laterality: N/A;  . COLONOSCOPY WITH PROPOFOL N/A 09/26/2018   Procedure: COLONOSCOPY WITH PROPOFOL;  Surgeon: Lucilla Lame, MD;  Location: [facility] ENDOSCOPY;  Service: Endoscopy;  Laterality: N/A;  . CYSTOSCOPY W/ RETROGRADES Right 08/18/2018   Procedure: CYSTOSCOPY WITH RETROGRADE PYELOGRAM;  Surgeon: Billey Co, MD;  Location: [facility] ORS;  Service: Urology;  Laterality: Right;  . CYSTOSCOPY/URETEROSCOPY/HOLMIUM LASER/STENT PLACEMENT Right 08/18/2018   Procedure: CYSTOSCOPY/URETEROSCOPY/STENT PLACEMENT;  Surgeon: Billey Co, MD;  Location: [facility] ORS;  Service: Urology;  Laterality: Right;  . ESOPHAGOGASTRODUODENOSCOPY (EGD) WITH PROPOFOL N/A 09/26/2018   Procedure: ESOPHAGOGASTRODUODENOSCOPY (EGD) WITH PROPOFOL;  Surgeon: Lucilla Lame, MD;  Location: [facility] ENDOSCOPY;  Service: Endoscopy;  Laterality: N/A;  . GIVENS CAPSULE STUDY N/A 11/03/2018   Procedure: GIVENS CAPSULE STUDY;  Surgeon: Lucilla Lame, MD;  Location: [facility] ENDOSCOPY;  Service: Endoscopy;  Laterality: N/A;  . JOINT REPLACEMENT Left   . KNEE CLOSED REDUCTION Left 04/15/2015   Procedure: CLOSED MANIPULATION KNEE;  Surgeon: Thornton Park, MD;  Location: [facility] ORS;  Service: Orthopedics;  Laterality: Left;  . KNEE SURGERY    . TEE WITHOUT CARDIOVERSION N/A 09/13/2017   Procedure: TRANSESOPHAGEAL ECHOCARDIOGRAM (TEE);  Surgeon: Nelva Bush, MD;  Location: [facility] ORS;  Service: Cardiovascular;  Laterality: N/A;  . TOTAL KNEE ARTHROPLASTY Left 12/25/2014   Procedure: TOTAL KNEE ARTHROPLASTY;  Surgeon: Thornton Park, MD;  Location: [facility] ORS;  Service: Orthopedics;  Laterality: Left;  . TUBAL LIGATION       Medical History: Past Medical History:  Diagnosis Date  . (HFpEF) heart failure with preserved ejection fraction ([place])    a. 08/2017 Echo: EF 55-60%.  Grade 2 diastolic dysfunction.  Moderate mitral stenosis.  . Allergy   . Anemia   . Anxiety   . BRCA negative 03/22/2013  . Bronchitis 02/19/2016   ON LEVAQUIN PO  . Chest tightness   . Cigarette smoker 09/11/2017   8-10 day  . GERD (gastroesophageal reflux disease)   . History of kidney stones   . Hyperlipidemia   . Hypertension   . Interstitial lung disease ([place])    a. CT 2013 b. 02/2018 CXR noted recurrent intersistial changes ILD vs chronic bronchitis  . Moderate mitral stenosis    a.  08/2017 TEE: EF 60 to 65%.  Moderate mitral stenosis.  Mean gradient 14 mmHg.  Valve area 2.59 cm by planimetry, 2.72 cm by pressure half-time.  . Persistent atrial fibrillation ([place])    a. 08/2017 s/p TEE/DCCV; b. CHA2DS2VASc = 2-->warfarin.    Family History: Family History  Problem Relation Age of Onset  .  Cancer Mother 20       breast  . Hypertension Mother   . Cancer Maternal Aunt        breast  . Cancer Maternal Grandmother        breast  . Osteoarthritis Father   . Hypertension Father     Social History   Socioeconomic History  . Marital status: Married    Spouse name: Not on file  . Number of children: Not on file  . Years of education: Not on file  . Highest education level: Not on file  Occupational History  . Not on file  Social Needs  . Financial resource strain: Not on file  . Food insecurity    Worry: Not on file    Inability: Not on file  . Transportation needs    Medical: Not on file    Non-medical: Not on file  Tobacco Use  . Smoking status: Former Smoker    Packs/day: 0.50    Years: 20.00    Pack years: 10.00    Types: Cigarettes    Quit date: 02/20/2018    Years since quitting: 0.7  . Smokeless tobacco: Never Used  . Tobacco comment: down to 2-3 daily  Substance and Sexual Activity  . Alcohol  use: Yes    Comment: rarely  . Drug use: No  . Sexual activity: Not on file  Lifestyle  . Physical activity    Days per week: Not on file    Minutes per session: Not on file  . Stress: Not on file  Relationships  . Social Herbalist on phone: Not on file    Gets together: Not on file    Attends religious service: Not on file    Active member of club or organization: Not on file    Attends meetings of clubs or organizations: Not on file    Relationship status: Not on file  . Intimate partner violence    Fear of current or ex partner: Not on file    Emotionally abused: Not on file    Physically abused: Not on file    Forced sexual activity: Not on file  Other Topics Concern  . Not on file  Social History Narrative  . Not on file      Review of Systems  Constitutional: Negative for chills, fatigue and unexpected weight change.  HENT: Negative for congestion, postnasal drip, rhinorrhea, sneezing and sore throat.   Respiratory: Negative for cough, chest tightness, shortness of breath and wheezing.   Cardiovascular: Negative for chest pain and palpitations.       Ankle swelling.  Gastrointestinal: Negative for abdominal pain, constipation, diarrhea, nausea and vomiting.  Genitourinary: Positive for vaginal discharge. Negative for dysuria and frequency.       Vaginal itching and irritation.   Musculoskeletal: Positive for arthralgias. Negative for back pain, joint swelling and neck pain.       Right knee pain   Skin: Negative for rash.  Neurological: Negative for dizziness, tremors, numbness and headaches.  Hematological: Negative for adenopathy. Does not bruise/bleed easily.  Psychiatric/Behavioral: Negative for behavioral problems (Depression), sleep disturbance and suicidal ideas. The patient is not nervous/anxious.    Today's Vitals   11/27/18 0825  BP: 131/70  Pulse: 67  Resp: 16  Temp: (!) 97.4 F (36.3 C)  SpO2: 99%  Weight: 249 lb (112.9 kg)  Height:  '5\' 4"'  (1.626 m)   Body mass index is 42.74 kg/m.  Physical Exam  Vitals signs and nursing note reviewed.  Constitutional:      General: She is not in acute distress.    Appearance: Normal appearance. She is well-developed. She is not diaphoretic.  HENT:     Head: Normocephalic and atraumatic.     Mouth/Throat:     Pharynx: No oropharyngeal exudate.  Eyes:     Pupils: Pupils are equal, round, and reactive to light.  Neck:     Musculoskeletal: Normal range of motion and neck supple.     Thyroid: No thyromegaly.     Vascular: No JVD.     Trachea: No tracheal deviation.  Cardiovascular:     Rate and Rhythm: Normal rate. Rhythm irregular.     Heart sounds: Normal heart sounds. No murmur. No friction rub. No gallop.   Pulmonary:     Effort: Pulmonary effort is normal. No respiratory distress.     Breath sounds: Normal breath sounds. No wheezing or rales.  Chest:     Chest wall: No tenderness.  Abdominal:     Palpations: Abdomen is soft.  Genitourinary:    Comments: Urine sample is clear of infection or abnormalities.  Musculoskeletal: Normal range of motion.     Comments: Moderate right knee pain. Does have good ROM but diminished strength. Was seen in Urgent care yesterday due to severe knee pain.   Lymphadenopathy:     Cervical: No cervical adenopathy.  Skin:    General: Skin is warm and dry.  Neurological:     Mental Status: She is alert and oriented to person, place, and time.     Cranial Nerves: No cranial nerve deficit.  Psychiatric:        Behavior: Behavior normal.        Thought Content: Thought content normal.        Judgment: Judgment normal.    Assessment/Plan: 1. Right knee pain, unspecified chronicity Patient seen in Urgent Care yesterday. Was given a prednisone taper for six days. Recommended she start this and take as directed. She was also given short term prescription for Hydrocodone/APAP 5/315m. Advised she take as needed and as prescribed   2.  Dysuria - POCT Urinalysis Dipstick negative for infection or abnormalities.   3. Vaginal candidiasis Diflucan should be taken once. Repeat dose in three days for persistent symptoms.  - fluconazole (DIFLUCAN) 150 MG tablet; Take 1 tablet po once. May repeat dose in 3 days as needed for persistent symptoms.  Dispense: 3 tablet; Refill: 0  4. Bacterial vaginitis Metronidazole vaginal gel should be used nightly for next seven nights.  - metroNIDAZOLE (METROGEL) 0.75 % vaginal gel; Place 1 Applicatorful vaginally at bedtime.  Dispense: 70 g; Refill: 0  General Counseling: Tanaysha verbalizes understanding of the findings of todays visit and agrees with plan of treatment. I have discussed any further diagnostic evaluation that may be needed or ordered today. We also reviewed her medications today. she has been encouraged to call the office with any questions or concerns that should arise related to todays visit.  This patient was seen by HLeretha PolFNP Collaboration with Dr FLavera Guiseas a part of collaborative care agreement  Orders Placed This Encounter  Procedures  . POCT Urinalysis Dipstick    Meds ordered this encounter  Medications  . metroNIDAZOLE (METROGEL) 0.75 % vaginal gel    Sig: Place 1 Applicatorful vaginally at bedtime.    Dispense:  70 g    Refill:  0    Order Specific Question:  Supervising Provider    Answer:   Lavera Guise [[id number]]  . fluconazole (DIFLUCAN) 150 MG tablet    Sig: Take 1 tablet po once. May repeat dose in 3 days as needed for persistent symptoms.    Dispense:  3 tablet    Refill:  0    Order Specific Question:   Supervising Provider    Answer:   Lavera Guise [[id number]]    Time spent: 5 Minutes      Dr Lavera Guise Internal medicine

## 2018-11-28 ENCOUNTER — Other Ambulatory Visit: Payer: Self-pay

## 2018-11-28 ENCOUNTER — Other Ambulatory Visit
Admission: RE | Admit: 2018-11-28 | Discharge: 2018-11-28 | Disposition: A | Discharge status: Home / Self Care | Payer: Managed Care, Other (non HMO) | Source: Ambulatory Visit | Attending: Physician Assistant | Admitting: Physician Assistant

## 2018-11-28 DIAGNOSIS — Z79899 Other long term (current) drug therapy: Secondary | ICD-10-CM

## 2018-11-28 DIAGNOSIS — I480 Paroxysmal atrial fibrillation: Secondary | ICD-10-CM | POA: Diagnosis present

## 2018-11-28 DIAGNOSIS — I5032 Chronic diastolic (congestive) heart failure: Secondary | ICD-10-CM

## 2018-11-28 LAB — BASIC METABOLIC PANEL
Anion gap: 11 (ref 5–15)
BUN: 20 mg/dL (ref 6–20)
CO2: 28 mmol/L (ref 22–32)
Calcium: 8.6 mg/dL — ABNORMAL LOW (ref 8.9–10.3)
Chloride: 99 mmol/L (ref 98–111)
Creatinine, Ser: 1.34 mg/dL — ABNORMAL HIGH (ref 0.44–1.00)
GFR calc Af Amer: 53 mL/min — ABNORMAL LOW (ref >60)
GFR calc non Af Amer: 45 mL/min — ABNORMAL LOW (ref >60)
Glucose, Bld: 140 mg/dL — ABNORMAL HIGH (ref 70–99)
Potassium: 3.1 mmol/L — ABNORMAL LOW (ref 3.5–5.1)
Sodium: 138 mmol/L (ref 135–145)

## 2018-11-29 ENCOUNTER — Other Ambulatory Visit: Payer: Self-pay

## 2018-11-29 ENCOUNTER — Telehealth: Payer: Self-pay

## 2018-11-29 DIAGNOSIS — I5032 Chronic diastolic (congestive) heart failure: Secondary | ICD-10-CM

## 2018-11-29 DIAGNOSIS — E876 Hypokalemia: Secondary | ICD-10-CM

## 2018-11-29 DIAGNOSIS — I480 Paroxysmal atrial fibrillation: Secondary | ICD-10-CM

## 2018-11-29 LAB — MAGNESIUM: Magnesium: 1.9 mg/dL (ref 1.7–2.4)

## 2018-11-29 MED ORDER — POTASSIUM CHLORIDE ER 10 MEQ PO TBCR: 60.0000 meq | EXTENDED_RELEASE_TABLET | Freq: Every day | ORAL | Status: DC

## 2018-11-29 NOTE — Telephone Encounter (Signed)
Patient made aware of lab results and Christell Faith, PA recommendation to increase K+ to 60 mEq daily and to repeat a bmet on 12/04/18. Contacted [facility] lab to add on the requested Magnesium and Renin/Aldesterone ration. The lab tech did draw and extra tube. The Renin/Aldetsreone is sent out to [place]. The [facility] lab will call back if they are unable to add on the Renin/Aldesterone.

## 2018-11-29 NOTE — Progress Notes (Signed)
renin

## 2018-11-29 NOTE — Telephone Encounter (Signed)
-----   Message from Rise Mu, PA-C sent at 11/28/2018  7:40 PM EST ----- Potassium remains low.  Renal function remains mildly elevated, though stable.  Please add on a magnesium level along with a renin/aldosterone ratio.  In the meantime, Please have her increase her KCl to 60 mEq daily and recheck BMET 11/16. If we cannot add on the renin/aldosterone ratio to the current BMET, this can be obtained when she follows up for labs on 11/16.

## 2018-11-29 NOTE — Progress Notes (Signed)
Mag

## 2018-12-04 ENCOUNTER — Other Ambulatory Visit
Admission: RE | Admit: 2018-12-04 | Discharge: 2018-12-04 | Disposition: A | Discharge status: Home / Self Care | Payer: Managed Care, Other (non HMO) | Source: Ambulatory Visit | Attending: Physician Assistant | Admitting: Physician Assistant

## 2018-12-04 ENCOUNTER — Ambulatory Visit (INDEPENDENT_AMBULATORY_CARE_PROVIDER_SITE_OTHER): Payer: Managed Care, Other (non HMO)

## 2018-12-04 ENCOUNTER — Other Ambulatory Visit: Payer: Self-pay

## 2018-12-04 DIAGNOSIS — E876 Hypokalemia: Secondary | ICD-10-CM | POA: Insufficient documentation

## 2018-12-04 DIAGNOSIS — I050 Rheumatic mitral stenosis: Secondary | ICD-10-CM

## 2018-12-04 DIAGNOSIS — I4891 Unspecified atrial fibrillation: Secondary | ICD-10-CM | POA: Diagnosis not present

## 2018-12-04 DIAGNOSIS — I5032 Chronic diastolic (congestive) heart failure: Secondary | ICD-10-CM | POA: Diagnosis present

## 2018-12-04 DIAGNOSIS — Z5181 Encounter for therapeutic drug level monitoring: Secondary | ICD-10-CM | POA: Diagnosis not present

## 2018-12-04 DIAGNOSIS — I480 Paroxysmal atrial fibrillation: Secondary | ICD-10-CM | POA: Insufficient documentation

## 2018-12-04 LAB — BASIC METABOLIC PANEL
Anion gap: 14 (ref 5–15)
BUN: 25 mg/dL — ABNORMAL HIGH (ref 6–20)
CO2: 29 mmol/L (ref 22–32)
Calcium: 9.3 mg/dL (ref 8.9–10.3)
Chloride: 94 mmol/L — ABNORMAL LOW (ref 98–111)
Creatinine, Ser: 1.66 mg/dL — ABNORMAL HIGH (ref 0.44–1.00)
GFR calc Af Amer: 41 mL/min — ABNORMAL LOW (ref >60)
GFR calc non Af Amer: 35 mL/min — ABNORMAL LOW (ref >60)
Glucose, Bld: 126 mg/dL — ABNORMAL HIGH (ref 70–99)
Potassium: 3.4 mmol/L — ABNORMAL LOW (ref 3.5–5.1)
Sodium: 137 mmol/L (ref 135–145)

## 2018-12-04 LAB — POCT INR: INR: 4.4 — AB (ref 2.0–3.0)

## 2018-12-04 NOTE — Patient Instructions (Signed)
Please skip warfarin tonight, then START NEW DOSAGE of warfarin 1/2 tablet every day.  Recheck in 2 weeks.

## 2018-12-04 NOTE — Addendum Note (Signed)
Addended by: Santiago Bur on: 12/04/2018 11:53 AM   Modules accepted: Orders

## 2018-12-09 DIAGNOSIS — N179 Acute kidney failure, unspecified: Secondary | ICD-10-CM | POA: Insufficient documentation

## 2018-12-09 DIAGNOSIS — N1832 Chronic kidney disease, stage 3b: Secondary | ICD-10-CM | POA: Insufficient documentation

## 2018-12-11 ENCOUNTER — Encounter: Payer: Self-pay | Admitting: Cardiovascular Disease

## 2018-12-11 DIAGNOSIS — I5032 Chronic diastolic (congestive) heart failure: Secondary | ICD-10-CM

## 2018-12-11 NOTE — Telephone Encounter (Signed)
This encounter was created in error - please disregard.

## 2018-12-12 ENCOUNTER — Other Ambulatory Visit: Payer: Self-pay | Admitting: Physician Assistant

## 2018-12-12 NOTE — Progress Notes (Signed)
No openings at [facility] - [place]   Called central scheduling for [place]

## 2018-12-12 NOTE — Progress Notes (Signed)
Offered patient opening at [facility] office at 11 am Wed 11/25 .  She is unable to make it due to work schedule.  Patient would also be unable to make Monday 11/30 appt.     She confirmed she will come to office visit on 12/1 .

## 2018-12-12 NOTE — Telephone Encounter (Signed)
Call to patient to discuss results and POC from Christell Faith, [place].   Pt agreeable to POC.   Order placed for renal artery u/s.  Routing to scheduling for appt.   Advised pt to call for any further questions or concerns.

## 2018-12-12 NOTE — Telephone Encounter (Signed)
-----   Message from Rise Mu, PA-C sent at 12/12/2018  1:25 PM EST ----- Labs showed an elevated renin level which may be contributing to her hypertension. Please schedule patient for renal artery ultrasound/Doppler to evaluate for renal artery stenosis. If this is does not show bilateral renal artery stenosis we will plan to start the patient on ARB and titrate to maximum tolerated dose.  Please see if we can get this done prior to her follow-up appointment with her primary cardiologist on 12/1.  She should follow-up as directed.

## 2018-12-13 NOTE — Progress Notes (Signed)
Added  To waitlist

## 2018-12-17 NOTE — Progress Notes (Signed)
Cardiology Office Note  Date:  12/19/2018   ID:  Leslie [name], DOB [birth year], MRN [id number]  PCP:  Ronnell Freshwater, NP   Chief Complaint  Patient presents with  . other    3 month f/u c/o rapid HR and edema. Pt missed coumadin appointment yesterday and requesting INR check. Meds reviewed verbally with pt.    HPI:  Leslie [name] is a 53 y.o. female with a hx of  At least moderate to severe mitral valve stenosis morbid obesity,  Snores, possible OSA anxiety,  hyperlipidemia,  Hospital admission 09/11/2017 for  shortness of breath chest tightness in atrial fibrillation with RVR Had TEE  , Cardioversion Mild COPD, long hx of smoking Cardiac CTA Coronary calcium score of 10. Who presents for follow up of her atrial fibrillation  Last week heart rate up to >100, lasted 2 hrs, then back to normal Think she may be having paroxysmal atrial fibrillation  Still having significant trouble with lower extremity swelling Was previously on diltiazem 180 daily, last year increased up to 240 daily  Lab work reviewed with her CR 1.66, BUN 25  Over the summer 2020 developed right flank pain,   underwent right ureteroscopy, laser lithotripsy, and stent placement on 08/18/2018.     After stent removal, the patient reports progressive generalized malaise, fatigue, and nausea.  For continued pain she was taking  naproxen (up to 800 mg).  seen at urgent care and prescribed trimethoprim/sulfamethoxazole and tamsulosin.   continued malaise, the patient presented to the [facility] ED on 08/26/2018, complaining of persistent flank pain as well as shortness of breath, palpitations, and chills.     CT of the abdomen and pelvis showed enlarged right kidney with diffuse perinephric edema and mild dilation of the right collecting system and right ureter.    creatinine of 2.1 (up from 1.3 on 08/15/2018).   Creatinine trended up  to 4.1  Since has recovered creatinine 1.6  She had acute kidney injury and sepsis  in the setting of recent right ureteral stone status post lithotripsy and likely pyelonephritis.  Hospitalization complicated by worsening shortness of breath  Felt to have acute on chronic HFpEF in the setting of at least moderate mitral stenosis  echo 08/2018, results reviewed with her  The mitral valve was not well visualized. Moderate thickening of the mitral valve leaflet. Moderate-severe mitral valve stenosis, with mean gradient of 16 mmHg and MVA by continuity equation of 1.0 cm^2. MVA by PHT calculated at 3.5 cm^2  TEE 08/2017 Mitral valve: Mobility of the posterior leaflet was restricted.   No evidence of vegetation. Transvalvular velocity was increased.   The findings are consistent with moderate stenosis. Mean gradient   (D): 14 mm Hg. Planimetered valve area: 2.59 cm^2.  EKG personally reviewed by myself on todays visit Normal sinus rhythm rate 80 bpm no significant ST-T wave changes  No past medical history reviewed atrial fibrillation December 2019 01/05/2018 awoke suddenly with tachypalpitations  rates in the 130s   EMS was called, noted to be in sinus rhythm with heart rates in the 80s to 90s bpm.    continued paroxysmal symptoms  seen on 12/20.   EKG at that time showed sinus rhythm.  Her diltiazem was increased to 240 mg daily.   Cardiac CTA 1. Coronary calcium score of 10. This was 65 percentile for age and sex matched control.. Normal coronary origin with right dominance.Mild non-obstructive CAD.   PMH:   has a past medical history of (  HFpEF) heart failure with preserved ejection fraction ([place]), Allergy, Anemia, Anxiety, BRCA negative (03/22/2013), Bronchitis (02/19/2016), Chest tightness, Cigarette smoker (09/11/2017), GERD (gastroesophageal reflux disease), History of kidney stones, Hyperlipidemia, Hypertension, Interstitial lung disease ([place]), Moderate mitral stenosis, and Persistent atrial fibrillation ([place]).  PSH:    Past Surgical History:  Procedure  Laterality Date  . ABDOMINAL HYSTERECTOMY     total  . ABDOMINAL HYSTERECTOMY    . BREAST BIOPSY Bilateral 2012  . BREAST BIOPSY Right 08-07-12   fibroadenomatous changes and columnar cells  . BREAST BIOPSY  02/03/2015   stereo byrnett  . CHOLECYSTECTOMY N/A 02/27/2016   Procedure: LAPAROSCOPIC CHOLECYSTECTOMY;  Surgeon: Christene Lye, MD;  Location: [facility] ORS;  Service: General;  Laterality: N/A;  . COLONOSCOPY WITH PROPOFOL N/A 09/26/2018   Procedure: COLONOSCOPY WITH PROPOFOL;  Surgeon: Lucilla Lame, MD;  Location: [facility] ENDOSCOPY;  Service: Endoscopy;  Laterality: N/A;  . CYSTOSCOPY W/ RETROGRADES Right 08/18/2018   Procedure: CYSTOSCOPY WITH RETROGRADE PYELOGRAM;  Surgeon: Billey Co, MD;  Location: [facility] ORS;  Service: Urology;  Laterality: Right;  . CYSTOSCOPY/URETEROSCOPY/HOLMIUM LASER/STENT PLACEMENT Right 08/18/2018   Procedure: CYSTOSCOPY/URETEROSCOPY/STENT PLACEMENT;  Surgeon: Billey Co, MD;  Location: [facility] ORS;  Service: Urology;  Laterality: Right;  . ESOPHAGOGASTRODUODENOSCOPY (EGD) WITH PROPOFOL N/A 09/26/2018   Procedure: ESOPHAGOGASTRODUODENOSCOPY (EGD) WITH PROPOFOL;  Surgeon: Lucilla Lame, MD;  Location: [facility] ENDOSCOPY;  Service: Endoscopy;  Laterality: N/A;  . GIVENS CAPSULE STUDY N/A 11/03/2018   Procedure: GIVENS CAPSULE STUDY;  Surgeon: Lucilla Lame, MD;  Location: [facility] ENDOSCOPY;  Service: Endoscopy;  Laterality: N/A;  . JOINT REPLACEMENT Left   . KNEE CLOSED REDUCTION Left 04/15/2015   Procedure: CLOSED MANIPULATION KNEE;  Surgeon: Thornton Park, MD;  Location: [facility] ORS;  Service: Orthopedics;  Laterality: Left;  . KNEE SURGERY    . TEE WITHOUT CARDIOVERSION N/A 09/13/2017   Procedure: TRANSESOPHAGEAL ECHOCARDIOGRAM (TEE);  Surgeon: Nelva Bush, MD;  Location: [facility] ORS;  Service: Cardiovascular;  Laterality: N/A;  . TOTAL KNEE ARTHROPLASTY Left 12/25/2014   Procedure: TOTAL KNEE ARTHROPLASTY;  Surgeon: Thornton Park, MD;  Location: [facility] ORS;   Service: Orthopedics;  Laterality: Left;  . TUBAL LIGATION      Current Outpatient Medications on File Prior to Visit  Medication Sig Dispense Refill  . albuterol (VENTOLIN HFA) 108 (90 Base) MCG/ACT inhaler Inhale 2 puffs into the lungs every 4 (four) hours as needed for wheezing or shortness of breath. 3 Inhaler 0  . ALPRAZolam (XANAX) 0.5 MG tablet Take 1 tablet (0.5 mg total) by mouth at bedtime as needed for anxiety. 30 tablet 2  . atorvastatin (LIPITOR) 80 MG tablet Take 1 tablet (80 mg total) by mouth daily at 6 PM. 90 tablet 3  . diltiazem (CARDIZEM CD) 240 MG 24 hr capsule TAKE 1 CAPSULE DAILY 90 capsule 3  . estradiol (ESTRACE) 0.5 MG tablet Take 1 tablet (0.5 mg total) by mouth daily. 90 tablet 1  . metoprolol succinate (TOPROL-XL) 50 MG 24 hr tablet Take 1 tablet (50 mg total) by mouth daily. Take with or immediately following a meal. 90 tablet 3  . metroNIDAZOLE (METROGEL) 0.75 % vaginal gel Place 1 Applicatorful vaginally at bedtime. 70 g 0  . omeprazole (PRILOSEC) 40 MG capsule Take 1 capsule (40 mg total) by mouth daily. 90 capsule 1  . potassium chloride (KLOR-CON) 10 MEQ tablet Take 6 tablets (60 mEq total) by mouth daily. (Patient taking differently: Take 20 mEq by mouth daily. )    . torsemide (DEMADEX)  20 MG tablet Take 2 tablets (40 mg) by mouth in the morning and take 1 tablet (20 mg) by mouth at 2 pm. 270 tablet 2  . warfarin (COUMADIN) 2.5 MG tablet TAKE 1/2 TO 1 TABLET DAILY AS DIRECTED BY COUMADIN CLINIC. (Patient taking differently: Take 1.25-2.5 mg by mouth See admin instructions. Take 2.5 mg at night on Mon, Wed, and Fri.  Take 1.25 mg at night on Sun, Tue, Thur, and Sat) 90 tablet 1   No current facility-administered medications on file prior to visit.     Allergies:   Morphine, Oxycodone hcl, and Dilaudid [hydromorphone hcl]   Social History:  The patient  reports that she quit smoking about 9 months ago. Her smoking use included cigarettes. She has a 10.00  pack-year smoking history. She has never used smokeless tobacco. She reports current alcohol use. She reports that she does not use drugs.   Family History:   family history includes Cancer in her maternal aunt and maternal grandmother; Cancer (age of onset: 60) in her mother; Hypertension in her father and mother; Osteoarthritis in her father.    Review of Systems: Review of Systems  Constitutional: Negative.   Respiratory: Negative.   Cardiovascular: Negative.   Gastrointestinal: Negative.   Musculoskeletal: Positive for joint pain.  Neurological: Negative.   Psychiatric/Behavioral: Negative.   All other systems reviewed and are negative.   PHYSICAL EXAM: VS:  BP 130/80 (BP Location: Left Arm, Patient Position: Sitting, Cuff Size: Normal)   Pulse 80   Ht '5\' 4"'  (1.626 m)   Wt 246 lb 4 oz (111.7 kg)   SpO2 98%   BMI 42.27 kg/m  , BMI Body mass index is 42.27 kg/m. Constitutional:  oriented to person, place, and time. No distress.  Obese HENT:  Head: Grossly normal Eyes:  no discharge. No scleral icterus.  Neck: No JVD, no carotid bruits  Cardiovascular: Regular rate and rhythm, no murmurs appreciated Pulmonary/Chest: Clear to auscultation bilaterally, no wheezes or rails Abdominal: Soft.  no distension.  no tenderness.  Musculoskeletal: Normal range of motion Neurological:  normal muscle tone. Coordination normal. No atrophy Skin: Skin warm and dry Psychiatric: normal affect, pleasant   Recent Labs: 08/26/2018: B Natriuretic Peptide 539.0; TSH 0.864 09/19/2018: ALT 20 11/22/2018: Hemoglobin 11.2; Platelets 288 11/28/2018: Magnesium 1.9 12/04/2018: BUN 25; Creatinine, Ser 1.66; Potassium 3.4; Sodium 137    Lipid Panel Lab Results  Component Value Date   CHOL 171 09/19/2018   HDL 33 (L) 09/19/2018   LDLCALC 101 (H) 09/19/2018   TRIG 186 (H) 09/19/2018      Wt Readings from Last 3 Encounters:  12/19/18 246 lb 4 oz (111.7 kg)  11/27/18 249 lb (112.9 kg)  11/26/18  217 lb (98.4 kg)      ASSESSMENT AND PLAN:   Persistent atrial fibrillation (HCC) - Plan: EKG 12-Lead Maintaining normal sinus rhythm Diltiazem likely contributing to worsening lower extremity edema Recommend we decrease diltiazem extended release down to 120 mg daily and increase metoprolol succinate 50 mg in the morning 100 mg in the evening INR supratherapeutic today, will confirm INR in the hospital  Mitral valve stenosis, unspecified etiology - Appears moderate to severe on recent echocardiogram We will need to monitor this closely Likely contributing to fluid retention  Essential hypertension Medications as detailed above  OSA (obstructive sleep apnea) managed by primary care Not on CPAP  Lower extremity edema History of chronic diastolic CHF exacerbated by morbid obesity and atrial fibrillation.  Likely also  with diastolic dysfunction -Recommend we try to wean down possibly off of diltiazem given unwanted side effects of lower extremity swelling  Acute on chronic renal failure Recommended that she take torsemide 40 daily with potassium 40 daily  Disposition:   F/U  1 month  Extensive review of recent hospitalization, complications, testing as detailed above with her in detail Numerous medication changes made Supratherapeutic INR, additional orders made  Total encounter time more than 45 minutes  Greater than 50% was spent in counseling and coordination of care with the patient    Orders Placed This Encounter  Procedures  . EKG 12-Lead     Signed, Esmond Plants, M.D., Ph.D. 12/19/2018  [place], [place]

## 2018-12-18 ENCOUNTER — Telehealth: Payer: Self-pay | Admitting: Cardiovascular Disease

## 2018-12-18 DIAGNOSIS — I480 Paroxysmal atrial fibrillation: Secondary | ICD-10-CM

## 2018-12-18 DIAGNOSIS — N179 Acute kidney failure, unspecified: Secondary | ICD-10-CM | POA: Insufficient documentation

## 2018-12-18 NOTE — Telephone Encounter (Signed)
° °  [facility] Pre-operative Risk Assessment    Request for surgical clearance:  1. What type of surgery is being performed? R total Knee Arthroplasty    2. When is this surgery scheduled? 02/21/19   3. What type of clearance is required (medical clearance vs. Pharmacy clearance to hold med vs. Both)? Both   4. Are there any medications that need to be held prior to surgery and how long?  Please advise   5. Practice name and name of physician performing surgery?  [facility]    6. What is your office phone number [phone]    7.   What is your office fax number [phone]  8.   Anesthesia type (None, local, MAC, general) ? Not noted    Clarisse Gouge 12/18/2018, 4:31 PM  _________________________________________________________________   (provider comments below)

## 2018-12-18 NOTE — Telephone Encounter (Signed)
Patient with diagnosis of afib on warfarin for anticoagulation.    Procedure:  R total Knee Arthroplasty   Date of procedure: 02/21/2019  CHADS2-VASc score of  4 (CHF, HTN, CAD, female)  Per office protocol, patient can hold warfarin for 5 days prior to procedure.    Patient will NOT need bridging with Lovenox (enoxaparin) around procedure.

## 2018-12-18 NOTE — Telephone Encounter (Signed)
   Primary Cardiologist:Timothy Rockey Situ, MD  Patient has an appointment scheduled with Dr. Rockey Situ tomorrow 12/19/2018 at 8:00am so pre-op assessment can be addressed at that time. Will route this request to Dr. Rockey Situ so that he is aware.  Will also route to pharmacy for their advise on holding Coumadin prior to procedure.  Darreld Mclean, PA-C  12/18/2018, 4:48 PM

## 2018-12-19 ENCOUNTER — Other Ambulatory Visit
Admission: RE | Admit: 2018-12-19 | Discharge: 2018-12-19 | Disposition: A | Discharge status: Home / Self Care | Payer: Managed Care, Other (non HMO) | Source: Ambulatory Visit | Attending: Cardiovascular Disease | Admitting: Cardiovascular Disease

## 2018-12-19 ENCOUNTER — Ambulatory Visit (INDEPENDENT_AMBULATORY_CARE_PROVIDER_SITE_OTHER): Payer: Managed Care, Other (non HMO) | Admitting: Pharmacist

## 2018-12-19 ENCOUNTER — Other Ambulatory Visit: Payer: Self-pay

## 2018-12-19 ENCOUNTER — Ambulatory Visit (INDEPENDENT_AMBULATORY_CARE_PROVIDER_SITE_OTHER): Payer: Managed Care, Other (non HMO) | Admitting: Cardiovascular Disease

## 2018-12-19 ENCOUNTER — Encounter: Payer: Self-pay | Admitting: Cardiovascular Disease

## 2018-12-19 VITALS — BP 130/80 | HR 80 | Ht 64.0 in | Wt 246.2 lb

## 2018-12-19 DIAGNOSIS — Z5181 Encounter for therapeutic drug level monitoring: Secondary | ICD-10-CM

## 2018-12-19 DIAGNOSIS — I5032 Chronic diastolic (congestive) heart failure: Secondary | ICD-10-CM | POA: Diagnosis not present

## 2018-12-19 DIAGNOSIS — I050 Rheumatic mitral stenosis: Secondary | ICD-10-CM

## 2018-12-19 DIAGNOSIS — I4891 Unspecified atrial fibrillation: Secondary | ICD-10-CM

## 2018-12-19 DIAGNOSIS — F17200 Nicotine dependence, unspecified, uncomplicated: Secondary | ICD-10-CM

## 2018-12-19 LAB — POCT INR: INR: 6.9 — AB (ref 2.0–3.0)

## 2018-12-19 LAB — PROTIME-INR
INR: 4.4 (ref 0.8–1.2)
Prothrombin Time: 42.4 seconds — ABNORMAL HIGH (ref 11.4–15.2)

## 2018-12-19 MED ORDER — DILTIAZEM HCL ER COATED BEADS 120 MG PO CP24: 120.0000 mg | ORAL_CAPSULE | Freq: Every day | ORAL | 3 refills | Status: DC

## 2018-12-19 MED ORDER — POTASSIUM CHLORIDE ER 10 MEQ PO TBCR: 40.0000 meq | EXTENDED_RELEASE_TABLET | Freq: Every day | ORAL | 3 refills | Status: DC

## 2018-12-19 MED ORDER — METOPROLOL SUCCINATE ER 50 MG PO TB24: 50.0000 mg | ORAL_TABLET | ORAL | 3 refills | Status: DC

## 2018-12-19 NOTE — Patient Instructions (Signed)
Description   Advised Pam to send patient for venous draw. Lab called and stated INR was 4.4. Patient been alternating 1/2 tablet with a whole tablet. Did not start taking 1/2 tablet daily as instructed. Called and spoke to pt and instructed her to hold coumadin today and tomorrow and then to start taking 1/2 tablet daily, like previously instructed.

## 2018-12-19 NOTE — Patient Instructions (Addendum)
Medication Instructions:  Your physician has recommended you make the following change in your medication:  1. INCREASE Metoprolol succinate 50 mg and take 1 tablet (50 mg) in the morning and 2 tablets (100 mg) at night  2. DECREASE Diltiazem ER to 120 mg once daily 3. TAKE Potassium 10 mEq 4 tablets (40 mEq) once daily  4. TAKE Torsemide 20 mg 2 tablets (40 mg) in the Morning & Extra 20 mg after lunch as needed for swelling.  5. HOLD Coumadin until you receive call with further instructions.    If you need a refill on your cardiac medications before your next appointment, please call your pharmacy.    Lab work: PT/INR today. Please go to [facility] to have labs done. Please give them lab slip as well.    If you have labs (blood work) drawn today and your tests are completely normal, you will receive your results only by: Marland Kitchen MyChart Message (if you have MyChart) OR . A paper copy in the mail If you have any lab test that is abnormal or we need to change your treatment, we will call you to review the results.   Testing/Procedures: None   Follow-Up: At [facility], you and your health needs are our priority.  As part of our continuing mission to provide you with exceptional heart care, we have created designated Provider Care Teams.  These Care Teams include your primary Cardiologist (physician) and Advanced Practice Providers (APPs -  Physician Assistants and Nurse Practitioners) who all work together to provide you with the care you need, when you need it.  . You will need a follow up appointment in 1 month  . Providers on your designated Care Team:   . Murray Hodgkins, NP . Christell Faith, PA-C . Marrianne Mood, PA-C  Any Other Special Instructions Will Be Listed Below (If Applicable).  For educational health videos Log in to : [email or web address] Or : [email or web address], password : [email or web address]

## 2018-12-19 NOTE — Telephone Encounter (Signed)
Clearance routed via Epic to Dr. Marry Guan.

## 2018-12-19 NOTE — Progress Notes (Signed)
Pt saw Dr. Rockey Situ and got pt got her INR checked by Pam. Phamr D instructed Pam to send pt to the lab. Lab called coumadin clinic stating pt's INR was 4.4. Called pt and gave her updated dosing instructions.

## 2018-12-19 NOTE — Telephone Encounter (Signed)
Acceptable risk for knee surgery No further testing needed Please see attached, okay to hold warfarin 5 days prior to procedure

## 2018-12-19 NOTE — Telephone Encounter (Signed)
   Primary Cardiologist:Timothy Rockey Situ, MD  Chart reviewed as part of pre-operative protocol coverage. Pre-op clearance already addressed by colleagues in earlier phone notes. To summarize recommendations:  -Per Dr. Rockey Situ: Acceptable risk for knee surgery No further testing needed Please see attached, okay to hold warfarin 5 days prior to procedure  -Per our pharmacy protocol:  CHADS2-VASc score of  4 (CHF, HTN, CAD, female) Per office protocol, patient can hold warfarin for 5 days prior to procedure.    Patient will NOT need bridging with Lovenox (enoxaparin) around procedure.   Will route this bundled recommendation to requesting provider via Epic fax function. Please call with questions.  Daune Perch, NP 12/19/2018, 3:03 PM

## 2018-12-25 ENCOUNTER — Ambulatory Visit: Payer: Managed Care, Other (non HMO) | Admitting: Adult Health

## 2018-12-25 ENCOUNTER — Encounter: Payer: Self-pay | Admitting: Adult Health

## 2018-12-25 ENCOUNTER — Ambulatory Visit (INDEPENDENT_AMBULATORY_CARE_PROVIDER_SITE_OTHER): Payer: Managed Care, Other (non HMO)

## 2018-12-25 ENCOUNTER — Other Ambulatory Visit: Payer: Self-pay

## 2018-12-25 ENCOUNTER — Other Ambulatory Visit: Payer: Self-pay | Admitting: Adult Health

## 2018-12-25 VITALS — BP 148/62 | HR 85 | Temp 97.4°F | Resp 16 | Ht 64.0 in | Wt 246.0 lb

## 2018-12-25 DIAGNOSIS — I050 Rheumatic mitral stenosis: Secondary | ICD-10-CM | POA: Diagnosis not present

## 2018-12-25 DIAGNOSIS — F17219 Nicotine dependence, cigarettes, with unspecified nicotine-induced disorders: Secondary | ICD-10-CM | POA: Diagnosis not present

## 2018-12-25 DIAGNOSIS — N281 Cyst of kidney, acquired: Secondary | ICD-10-CM

## 2018-12-25 DIAGNOSIS — I4891 Unspecified atrial fibrillation: Secondary | ICD-10-CM

## 2018-12-25 DIAGNOSIS — Z5181 Encounter for therapeutic drug level monitoring: Secondary | ICD-10-CM

## 2018-12-25 DIAGNOSIS — R300 Dysuria: Secondary | ICD-10-CM | POA: Diagnosis not present

## 2018-12-25 DIAGNOSIS — N3001 Acute cystitis with hematuria: Secondary | ICD-10-CM | POA: Diagnosis not present

## 2018-12-25 LAB — POCT URINALYSIS DIPSTICK
Bilirubin, UA: NEGATIVE
Glucose, UA: NEGATIVE
Ketones, UA: NEGATIVE
Nitrite, UA: NEGATIVE
Protein, UA: NEGATIVE
Spec Grav, UA: 1.01 (ref 1.010–1.025)
Urobilinogen, UA: 0.2 E.U./dL
pH, UA: 7.5 (ref 5.0–8.0)

## 2018-12-25 LAB — POCT INR: INR: 4.3 — AB (ref 2.0–3.0)

## 2018-12-25 MED ORDER — TRAMADOL HCL 50 MG PO TABS: 50.0000 mg | ORAL_TABLET | Freq: Three times a day (TID) | ORAL | 0 refills | Status: AC | PRN

## 2018-12-25 MED ORDER — NITROFURANTOIN MONOHYD MACRO 100 MG PO CAPS: 100.0000 mg | ORAL_CAPSULE | Freq: Two times a day (BID) | ORAL | 0 refills | Status: DC

## 2018-12-25 NOTE — Patient Instructions (Addendum)
Have a serving of greens today. Hold warfarin today & tomorrow, then resume dosage of 1/2 tablet every day. If still elevated at next visit, will send in 2 mg tablets. Recheck next week since you're on Bactrim.

## 2018-12-25 NOTE — Progress Notes (Signed)
[facility] [place], [place] [address]  Internal MEDICINE  Office Visit Note  Patient Name: Leslie [name]  [id number]  [id number]  Date of Service: 12/25/2018  Chief Complaint  Patient presents with  . Back Pain    lower back right side      HPI Pt is here for acute visit. She reports she has been having right flank pain for a few weeks.  She has been referred to [facility] urology, and was seen.  A renal ultrasound was performed.  At the time urology reports there was no stone, and told patient it could be imbedded in the kidney.  She was told she needed an MRI, and the ultrasound recommends CT.  She has not been contacted about this.  She reports she feels the same symptoms and pain, as when she had the kidney stone a few years ago.  Hospital stay this year for sepsis pyelonephritis.     Current Medication: Outpatient Encounter Medications as of 12/25/2018  Medication Sig  . albuterol (VENTOLIN HFA) 108 (90 Base) MCG/ACT inhaler Inhale 2 puffs into the lungs every 4 (four) hours as needed for wheezing or shortness of breath.  . ALPRAZolam (XANAX) 0.5 MG tablet Take 1 tablet (0.5 mg total) by mouth at bedtime as needed for anxiety.  Marland Kitchen atorvastatin (LIPITOR) 80 MG tablet Take 1 tablet (80 mg total) by mouth daily at 6 PM.  . diltiazem (CARDIZEM CD) 120 MG 24 hr capsule Take 1 capsule (120 mg total) by mouth daily.  Marland Kitchen estradiol (ESTRACE) 0.5 MG tablet Take 1 tablet (0.5 mg total) by mouth daily.  . metoprolol succinate (TOPROL-XL) 50 MG 24 hr tablet Take 1-2 tablets (50-100 mg total) by mouth as directed. Take 1 tablet (50 mg) in the Morning & 2 tablets (100 mg) at night.  . metroNIDAZOLE (METROGEL) 0.75 % vaginal gel Place 1 Applicatorful vaginally at bedtime.  Marland Kitchen omeprazole (PRILOSEC) 40 MG capsule Take 1 capsule (40 mg total) by mouth daily.  . potassium chloride (KLOR-CON) 10 MEQ tablet Take 4 tablets (40 mEq total) by mouth daily.  Marland Kitchen torsemide (DEMADEX) 20 MG  tablet Take 2 tablets (40 mg) by mouth in the morning and take 1 tablet (20 mg) by mouth at 2 pm.  . warfarin (COUMADIN) 2.5 MG tablet TAKE 1/2 TO 1 TABLET DAILY AS DIRECTED BY COUMADIN CLINIC. (Patient taking differently: Take 1.25-2.5 mg by mouth See admin instructions. Take 2.5 mg at night on Mon, Wed, and Fri.  Take 1.25 mg at night on Sun, Tue, Thur, and Sat)   No facility-administered encounter medications on file as of 12/25/2018.     Surgical History: Past Surgical History:  Procedure Laterality Date  . ABDOMINAL HYSTERECTOMY     total  . ABDOMINAL HYSTERECTOMY    . BREAST BIOPSY Bilateral 2012  . BREAST BIOPSY Right 08-07-12   fibroadenomatous changes and columnar cells  . BREAST BIOPSY  02/03/2015   stereo byrnett  . CHOLECYSTECTOMY N/A 02/27/2016   Procedure: LAPAROSCOPIC CHOLECYSTECTOMY;  Surgeon: Christene Lye, MD;  Location: [facility] ORS;  Service: General;  Laterality: N/A;  . COLONOSCOPY WITH PROPOFOL N/A 09/26/2018   Procedure: COLONOSCOPY WITH PROPOFOL;  Surgeon: Lucilla Lame, MD;  Location: [facility] ENDOSCOPY;  Service: Endoscopy;  Laterality: N/A;  . CYSTOSCOPY W/ RETROGRADES Right 08/18/2018   Procedure: CYSTOSCOPY WITH RETROGRADE PYELOGRAM;  Surgeon: Billey Co, MD;  Location: [facility] ORS;  Service: Urology;  Laterality: Right;  . CYSTOSCOPY/URETEROSCOPY/HOLMIUM LASER/STENT PLACEMENT Right 08/18/2018  Procedure: CYSTOSCOPY/URETEROSCOPY/STENT PLACEMENT;  Surgeon: Billey Co, MD;  Location: [facility] OR[facility];  Service: Urology;  Laterality: Right;  . ESOPHAGOGASTRODUODENOSCOPY (EGD) WITH PROPOFOL N/A 09/26/2018   Procedure: ESOPHAGOGASTRODUODENOSCOPY (EGD) WITH PROPOFOL;  Surgeon: Lucilla Lame, MD;  Location: [facility] ENDOS[facility];  Service: Endoscopy;  Laterality: N/A;  . GIVENS CAPSULE STUDY N/A 11/03/2018   Procedure: GIVENS CAPSULE STUDY;  Surgeon: Lucilla Lame, MD;  Location: [facility] ENDOSCOPY;  Service: Endoscopy;  Laterality: N/A;  . JOINT REPLACEMENT Left   . KNEE CLOSED  REDUCTION Left 04/15/2015   Procedure: CLOSED MANIPULATION KNEE;  Surgeon: Thornton Park, MD;  Location: [facility] ORS;  Service: Orthopedics;  Laterality: Left;  . KNEE SURGERY    . TEE WITHOUT CARDIOVERSION N/A 09/13/2017   Procedure: TRANSESOPHAGEAL ECHOCARDIOGRAM (TEE);  Surgeon: Nelva Bush, MD;  Location: [facility] ORS;  Service: Cardiovascular;  Laterality: N/A;  . TOTAL KNEE ARTHROPLASTY Left 12/25/2014   Procedure: TOTAL KNEE ARTHROPLASTY;  Surgeon: Thornton Park, MD;  Location: [facility] OR[facility];  Service: Orthopedics;  Laterality: Left;  . TUBAL LIGATION      Medical History: Past Medical History:  Diagnosis Date  . (HFpEF) heart failure with preserved ejection fraction ([place])    a. 08/2017 Echo: EF 55-60%.  Grade 2 diastolic dysfunction.  Moderate mitral stenosis.  . Allergy   . Anemia   . Anxiety   . BRCA negative 03/22/2013  . Bronchitis 02/19/2016   ON LEVAQUIN PO  . Chest tightness   . Cigarette smoker 09/11/2017   8-10 day  . GERD (gastroesophageal reflux disease)   . History of kidney stones   . Hyperlipidemia   . Hypertension   . Interstitial lung disease ([place])    a. CT 2013 b. 02/2018 CXR noted recurrent intersistial changes ILD vs chronic bronchitis  . Moderate mitral stenosis    a.  08/2017 TEE: EF 60 to 65%.  Moderate mitral stenosis.  Mean gradient 14 mmHg.  Valve area 2.59 cm by planimetry, 2.72 cm by pressure half-time.  . Persistent atrial fibrillation ([place])    a. 08/2017 s/p TEE/DCCV; b. CHA2DS2VASc = 2-->warfarin.    Family History: Family History  Problem Relation Age of Onset  . Cancer Mother 61       breast  . Hypertension Mother   . Cancer Maternal Aunt        breast  . Cancer Maternal Grandmother        breast  . Osteoarthritis Father   . Hypertension Father       Review of Systems  Constitutional: Negative for chills, fatigue and unexpected weight change.  HENT: Negative for congestion, rhinorrhea, sneezing and sore throat.   Eyes: Negative  for photophobia, pain and redness.  Respiratory: Negative for cough, chest tightness and shortness of breath.   Cardiovascular: Negative for chest pain and palpitations.  Gastrointestinal: Negative for abdominal pain, constipation, diarrhea, nausea and vomiting.  Endocrine: Negative.   Genitourinary: Negative for dysuria and frequency.  Musculoskeletal: Negative for arthralgias, back pain, joint swelling and neck pain.       Right flank pain .  Skin: Negative for rash.  Allergic/Immunologic: Negative.   Neurological: Negative for tremors and numbness.  Hematological: Negative for adenopathy. Does not bruise/bleed easily.  Psychiatric/Behavioral: Negative for behavioral problems and sleep disturbance. The patient is not nervous/anxious.      Vital Signs: Ht '5\' 4"'  (1.626 m)   Wt 246 lb (111.6 kg)   BMI 42.23 kg/m    Physical Exam Vitals and nursing note reviewed.  Constitutional:  General: She is not in acute distress.    Appearance: She is well-developed. She is not diaphoretic.  HENT:     Head: Normocephalic and atraumatic.     Mouth/Throat:     Pharynx: No oropharyngeal exudate.  Eyes:     Pupils: Pupils are equal, round, and reactive to light.  Neck:     Thyroid: No thyromegaly.     Vascular: No JVD.     Trachea: No tracheal deviation.  Cardiovascular:     Rate and Rhythm: Normal rate and regular rhythm.     Heart sounds: Normal heart sounds. No murmur. No friction rub. No gallop.   Pulmonary:     Effort: Pulmonary effort is normal. No respiratory distress.     Breath sounds: Normal breath sounds. No wheezing or rales.  Chest:     Chest wall: No tenderness.  Abdominal:     Palpations: Abdomen is soft.     Tenderness: There is no abdominal tenderness. There is no guarding.  Musculoskeletal:        General: Normal range of motion.     Cervical back: Normal range of motion and neck supple.  Lymphadenopathy:     Cervical: No cervical adenopathy.  Skin:     General: Skin is warm and dry.  Neurological:     Mental Status: She is alert and oriented to person, place, and time.     Cranial Nerves: No cranial nerve deficit.  Psychiatric:        Behavior: Behavior normal.        Thought Content: Thought content normal.        Judgment: Judgment normal.      LABS: Recent Results (from the past 2160 hour(s))  POCT INR     Status: Abnormal   Collection Time: 10/16/18 11:43 AM  Result Value Ref Range   INR 4.1 (A) 2.0 - 3.0  POCT INR     Status: Abnormal   Collection Time: 11/13/18  1:05 PM  Result Value Ref Range   INR 6.9 (A) 2.0 - 3.0  Protime-INR     Status: Abnormal   Collection Time: 11/13/18  1:26 PM  Result Value Ref Range   Prothrombin Time 43.9 (H) 11.4 - 15.2 seconds   INR 4.8 (HH) 0.8 - 1.2    Comment: RESULT REPEATED AND VERIFIED CRITICAL RESULT CALLED TO, READ BACK BY AND VERIFIED WITH:  MANDY MOODY AT 1416 11/13/2018 SDR (NOTE) INR goal varies based on device and disease states. Performed at [facility], [address] [place], [place] [address]   Basic metabolic panel     Status: Abnormal   Collection Time: 11/13/18  1:26 PM  Result Value Ref Range   Sodium 137 135 - 145 mmol/L   Potassium 2.9 (L) 3.5 - 5.1 mmol/L   Chloride 96 (L) 98 - 111 mmol/L   CO2 30 22 - 32 mmol/L   Glucose, Bld 106 (H) 70 - 99 mg/dL   BUN 20 6 - 20 mg/dL   Creatinine, Ser 1.41 (H) 0.44 - 1.00 mg/dL   Calcium 8.5 (L) 8.9 - 10.3 mg/dL   GFR calc non Af Amer 43 (L) >60 mL/min   GFR calc Af Amer 50 (L) >60 mL/min   Anion gap 11 5 - 15    Comment: Performed at [facility], [address], [place], [place] [address]  Basic metabolic panel     Status: Abnormal   Collection Time: 11/22/18  2:00 PM  Result Value Ref Range   Sodium 139 135 - 145 mmol/L   Potassium 2.9 (L) 3.5 - 5.1 mmol/L   Chloride 98 98 - 111 mmol/L   CO2 28 22 - 32 mmol/L   Glucose, Bld 104 (H) 70 - 99 mg/dL   BUN 21 (H) 6 - 20 mg/dL   Creatinine,  Ser 1.39 (H) 0.44 - 1.00 mg/dL   Calcium 8.7 (L) 8.9 - 10.3 mg/dL   GFR calc non Af Amer 43 (L) >60 mL/min   GFR calc Af Amer 50 (L) >60 mL/min   Anion gap 13 5 - 15    Comment: Performed at [facility], [address] [place], [place] [address]  CBC     Status: Abnormal   Collection Time: 11/22/18  2:00 PM  Result Value Ref Range   WBC 6.2 4.0 - 10.5 K/uL   RBC 3.92 3.87 - 5.11 MIL/uL   Hemoglobin 11.2 (L) 12.0 - 15.0 g/dL   HCT 33.9 (L) 36.0 - 46.0 %   MCV 86.5 80.0 - 100.0 fL   MCH 28.6 26.0 - 34.0 pg   MCHC 33.0 30.0 - 36.0 g/dL   RDW 13.8 11.5 - 15.5 %   Platelets 288 150 - 400 K/uL   nRBC 0.0 0.0 - 0.2 %    Comment: Performed at [facility], [address] [place], [place] [address]  POCT INR     Status: Abnormal   Collection Time: 11/22/18  2:08 PM  Result Value Ref Range   INR 5.4 (A) 2.0 - 3.0  POCT Urinalysis Dipstick     Status: None   Collection Time: 11/27/18  8:35 AM  Result Value Ref Range   Color, UA     Clarity, UA     Glucose, UA Negative Negative   Bilirubin, UA negative    Ketones, UA negative    Spec Grav, UA 1.010 1.010 - 1.025   Blood, UA negative    pH, UA 6.0 5.0 - 8.0   Protein, UA Negative Negative   Urobilinogen, UA 0.2 0.2 or 1.0 E.U./dL   Nitrite, UA negative    Leukocytes, UA Negative Negative   Appearance     Odor    Basic Metabolic Panel (BMET)     Status: Abnormal   Collection Time: 11/28/18  2:08 PM  Result Value Ref Range   Sodium 138 135 - 145 mmol/L   Potassium 3.1 (L) 3.5 - 5.1 mmol/L   Chloride 99 98 - 111 mmol/L   CO2 28 22 - 32 mmol/L   Glucose, Bld 140 (H) 70 - 99 mg/dL   BUN 20 6 - 20 mg/dL   Creatinine, Ser 1.34 (H) 0.44 - 1.00 mg/dL   Calcium 8.6 (L) 8.9 - 10.3 mg/dL   GFR calc non Af Amer 45 (L) >60 mL/min   GFR calc Af Amer 53 (L) >60 mL/min   Anion gap 11 5 - 15    Comment: Performed at [facility], [address] [place], [place] [address]  Magnesium     Status: None    Collection Time: 11/28/18  2:08 PM  Result Value Ref Range   Magnesium 1.9 1.7 - 2.4 mg/dL    Comment: Performed at [facility], [address] [place],  [address]  Aldosterone + renin activity w/ ratio     Status: Abnormal   Collection Time: 12/04/18 11:57 AM  Result Value Ref Range   PRA LC/MS/MS 23.928 (H) 0.167 -  5.380 ng/mL/hr    Comment: (NOTE) This test was developed and its performance characteristics determined by [facility]. It has not been cleared or approved by the Food and Drug Administration.    ALDO / PRA Ratio 0.6 0.0 - 30.0    Comment: (NOTE)                         Units:      ng/dL per ng/mL/hr Performed At: [facility] [place], [place] [id number] Rush Farmer MD LE:[id number]    Aldosterone 15.5 0.0 - 30.0 ng/dL    Comment: (NOTE) This test was developed and its performance characteristics determined by [facility]. It has not been cleared or approved by the Food and Drug Administration.   Basic metabolic panel     Status: Abnormal   Collection Time: 12/04/18 11:57 AM  Result Value Ref Range   Sodium 137 135 - 145 mmol/L   Potassium 3.4 (L) 3.5 - 5.1 mmol/L   Chloride 94 (L) 98 - 111 mmol/L   CO2 29 22 - 32 mmol/L   Glucose, Bld 126 (H) 70 - 99 mg/dL   BUN 25 (H) 6 - 20 mg/dL   Creatinine, Ser 1.66 (H) 0.44 - 1.00 mg/dL   Calcium 9.3 8.9 - 10.3 mg/dL   GFR calc non Af Amer 35 (L) >60 mL/min   GFR calc Af Amer 41 (L) >60 mL/min   Anion gap 14 5 - 15    Comment: Performed at [facility], [address] [place], [place] [address]  POCT INR     Status: Abnormal   Collection Time: 12/04/18 12:14 PM  Result Value Ref Range   INR 4.4 (A) 2.0 - 3.0  POCT INR     Status: Abnormal   Collection Time: 12/19/18  8:46 AM  Result Value Ref Range   INR 6.9 (A) 2.0 - 3.0  Protime-INR     Status: Abnormal   Collection Time: 12/19/18  9:42 AM  Result Value Ref Range   Prothrombin Time 42.4 (H) 11.4 - 15.2 seconds    INR 4.4 (HH) 0.8 - 1.2    Comment: CRITICAL RESULT CALLED TO, READ BACK BY AND VERIFIED WITHGlendon Axe RN AT 5916 12/19/2018 [facility] Performed at [facility], [address], [place], [place] [address]       Assessment/Plan: 1. Acute cystitis with hematuria Will treat patient with Macrobid and wait for culture.  - CULTURE, URINE COMPREHENSIVE - nitrofurantoin, macrocrystal-monohydrate, (MACROBID) 100 MG capsule; Take 1 capsule (100 mg total) by mouth 2 (two) times daily.  Dispense: 14 capsule; Refill: 0  2. Acquired cyst of kidney Renal CT ordered.  Pt given Tramadol to help with acute pain. - CT RENAL ABD W/WO; Future - traMADol (ULTRAM) 50 MG tablet; Take 1 tablet (50 mg total) by mouth every 8 (eight) hours as needed for up to 5 days.  Dispense: 15 tablet; Refill: 0  3. Cigarette nicotine dependence with nicotine-induced disorder Smoking cessation counseling: 1. Pt acknowledges the risks of long term smoking, she will try to quite smoking. 2. Options for different medications including nicotine products, chewing gum, patch etc, Wellbutrin and Chantix is discussed 3. Goal and date of compete cessation is discussed 4. Total time spent in smoking cessation is 15 min.   4. Dysuria - POCT Urinalysis Dipstick  General Counseling: Makensey verbalizes understanding of the findings of todays visit and agrees with plan of treatment. I have  discussed any further diagnostic evaluation that may be needed or ordered today. We also reviewed her medications today. she has been encouraged to call the office with any questions or concerns that should arise related to todays visit.   Orders Placed This Encounter  Procedures  . POCT Urinalysis Dipstick    No orders of the defined types were placed in this encounter.   Time spent: 25 Minutes   This patient was seen by Orson Gear AGNP-C in Collaboration with Dr Lavera Guise as a part of collaborative care  agreement    Kendell Bane AGNP-C Internal Medicine

## 2018-12-26 LAB — BASIC METABOLIC PANEL
BUN/Creatinine Ratio: 11 (ref 9–23)
BUN: 15 mg/dL (ref 6–24)
CO2: 27 mmol/L (ref 20–29)
Calcium: 8.8 mg/dL (ref 8.7–10.2)
Chloride: 95 mmol/L — ABNORMAL LOW (ref 96–106)
Creatinine, Ser: 1.34 mg/dL — ABNORMAL HIGH (ref 0.57–1.00)
GFR calc Af Amer: 53 mL/min/{1.73_m2} — ABNORMAL LOW (ref >59)
GFR calc non Af Amer: 46 mL/min/{1.73_m2} — ABNORMAL LOW (ref >59)
Glucose: 103 mg/dL — ABNORMAL HIGH (ref 65–99)
Potassium: 3 mmol/L — ABNORMAL LOW (ref 3.5–5.2)
Sodium: 139 mmol/L (ref 134–144)

## 2018-12-28 ENCOUNTER — Other Ambulatory Visit: Payer: Self-pay | Admitting: Adult Health

## 2018-12-28 DIAGNOSIS — E876 Hypokalemia: Secondary | ICD-10-CM

## 2018-12-28 LAB — CULTURE, URINE COMPREHENSIVE

## 2018-12-28 MED ORDER — POTASSIUM CHLORIDE CRYS ER 10 MEQ PO TBCR: 10.0000 meq | EXTENDED_RELEASE_TABLET | Freq: Two times a day (BID) | ORAL | 0 refills | Status: DC

## 2018-12-28 NOTE — Progress Notes (Signed)
Sent RX for K-Dur to pharmacy. Pts K level 3.0

## 2018-12-28 NOTE — Progress Notes (Signed)
Lab order to redraw Potassium before next visit in 10 days

## 2018-12-29 ENCOUNTER — Telehealth: Payer: Self-pay

## 2018-12-29 ENCOUNTER — Other Ambulatory Visit: Payer: Self-pay | Admitting: Adult Health

## 2018-12-29 NOTE — Telephone Encounter (Signed)
Pt notified of labs and rx , repeat labs before her appt

## 2018-12-29 NOTE — Telephone Encounter (Signed)
-----   Message from Kendell Bane, NP sent at 12/28/2018  5:46 PM EST ----- Creatinine improving, but potassium is low.  I sent 4 days worth of potassium supplement to pharmacy. She will need to have it redrawn before next appt, orders sent.

## 2019-01-01 ENCOUNTER — Ambulatory Visit (INDEPENDENT_AMBULATORY_CARE_PROVIDER_SITE_OTHER): Payer: Managed Care, Other (non HMO)

## 2019-01-01 ENCOUNTER — Other Ambulatory Visit: Payer: Self-pay

## 2019-01-01 ENCOUNTER — Other Ambulatory Visit: Payer: Self-pay | Admitting: Cardiovascular Disease

## 2019-01-01 DIAGNOSIS — I4891 Unspecified atrial fibrillation: Secondary | ICD-10-CM

## 2019-01-01 DIAGNOSIS — I050 Rheumatic mitral stenosis: Secondary | ICD-10-CM | POA: Diagnosis not present

## 2019-01-01 DIAGNOSIS — Z5181 Encounter for therapeutic drug level monitoring: Secondary | ICD-10-CM | POA: Diagnosis not present

## 2019-01-01 LAB — POCT INR: INR: 3.4 — AB (ref 2.0–3.0)

## 2019-01-01 MED ORDER — WARFARIN SODIUM 2 MG PO TABS: 2.0000 mg | ORAL_TABLET | Freq: Every day | ORAL | 0 refills | Status: DC

## 2019-01-01 NOTE — Patient Instructions (Signed)
-   skip warfarin today - I'm sending in 2 mg tablets - you will cut these in 1/2, as well. - START NEW DOSAGE of 1.25 mg (1/2 of the 2.5 mg tab) every day EXCEPT 1 MG (1/2 OF THE 2 MG TAB) ON MONDAYS, [place]. . - Recheck in 3 weeks.

## 2019-01-02 NOTE — Telephone Encounter (Signed)
Refill Request.  

## 2019-01-05 ENCOUNTER — Telehealth: Payer: Self-pay

## 2019-01-05 ENCOUNTER — Other Ambulatory Visit: Payer: Self-pay

## 2019-01-05 ENCOUNTER — Ambulatory Visit
Admission: RE | Admit: 2019-01-05 | Discharge: 2019-01-05 | Disposition: A | Discharge status: Home / Self Care | Payer: Managed Care, Other (non HMO) | Source: Ambulatory Visit | Attending: Adult Health | Admitting: Adult Health

## 2019-01-05 DIAGNOSIS — N281 Cyst of kidney, acquired: Secondary | ICD-10-CM | POA: Diagnosis present

## 2019-01-05 MED ORDER — IOHEXOL 350 MG/ML SOLN
100.0000 mL | Freq: Once | INTRAVENOUS | Status: AC | PRN
  Administered 2019-01-05: 100 mL via INTRAVENOUS

## 2019-01-05 NOTE — Telephone Encounter (Signed)
Confirmed appointment with patient. klh °

## 2019-01-09 ENCOUNTER — Other Ambulatory Visit: Payer: Self-pay

## 2019-01-09 ENCOUNTER — Encounter: Payer: Self-pay | Admitting: Adult Health

## 2019-01-09 ENCOUNTER — Ambulatory Visit: Payer: Managed Care, Other (non HMO) | Admitting: Adult Health

## 2019-01-09 VITALS — BP 126/70 | HR 69 | Temp 97.1°F | Resp 16 | Ht 64.0 in | Wt 244.0 lb

## 2019-01-09 DIAGNOSIS — I480 Paroxysmal atrial fibrillation: Secondary | ICD-10-CM | POA: Diagnosis not present

## 2019-01-09 DIAGNOSIS — N281 Cyst of kidney, acquired: Secondary | ICD-10-CM | POA: Diagnosis not present

## 2019-01-09 DIAGNOSIS — R0683 Snoring: Secondary | ICD-10-CM

## 2019-01-09 NOTE — Progress Notes (Signed)
[facility] [place], [place] [address]  Internal MEDICINE  Office Visit Note  Patient Name: Leslie [name]  [id number]  [id number]  Date of Service: 01/21/2019  Chief Complaint  Patient presents with  . Medical Management of Chronic Issues    ct follow up     HPI Pt is here for follow up on Ct scan. Her renal scan shows right renal stone, and cyst. She is encouraged to follow up with urology as planned for ongoing issues and surveillance of this cyst and stone given the history of sepsis earlier this year. Her A-fib is currently treated with lasix, metoprolol and diltizem. She denies any Chest pain, Shortness of breath, palpitations, headache, or blurred vision.     Current Medication: Outpatient Encounter Medications as of 01/09/2019  Medication Sig  . albuterol (VENTOLIN HFA) 108 (90 Base) MCG/ACT inhaler Inhale 2 puffs into the lungs every 4 (four) hours as needed for wheezing or shortness of breath.  . ALPRAZolam (XANAX) 0.5 MG tablet Take 1 tablet (0.5 mg total) by mouth at bedtime as needed for anxiety.  Marland Kitchen atorvastatin (LIPITOR) 80 MG tablet Take 1 tablet (80 mg total) by mouth daily at 6 PM.  . diltiazem (CARDIZEM CD) 120 MG 24 hr capsule Take 1 capsule (120 mg total) by mouth daily.  Marland Kitchen estradiol (ESTRACE) 0.5 MG tablet Take 1 tablet (0.5 mg total) by mouth daily.  . metoprolol succinate (TOPROL-XL) 50 MG 24 hr tablet Take 1-2 tablets (50-100 mg total) by mouth as directed. Take 1 tablet (50 mg) in the Morning & 2 tablets (100 mg) at night.  . metroNIDAZOLE (METROGEL) 0.75 % vaginal gel Place 1 Applicatorful vaginally at bedtime.  . nitrofurantoin, macrocrystal-monohydrate, (MACROBID) 100 MG capsule Take 1 capsule (100 mg total) by mouth 2 (two) times daily.  Marland Kitchen omeprazole (PRILOSEC) 40 MG capsule Take 1 capsule (40 mg total) by mouth daily.  . potassium chloride (KLOR-CON) 10 MEQ tablet Take 4 tablets (40 mEq total) by mouth daily.  . potassium chloride  (KLOR-CON) 10 MEQ tablet Take 1 tablet (10 mEq total) by mouth 2 (two) times daily.  Marland Kitchen torsemide (DEMADEX) 20 MG tablet Take 2 tablets (40 mg) by mouth in the morning and take 1 tablet (20 mg) by mouth at 2 pm.  . warfarin (COUMADIN) 2 MG tablet Take as directed by the [facility] Clinic  . warfarin (COUMADIN) 2.5 MG tablet TAKE 1/2 TO 1 TABLET DAILY AS DIRECTED BY COUMADIN CLINIC. (Patient taking differently: Take 1.25-2.5 mg by mouth See admin instructions. Take 2.5 mg at night on Mon, Wed, and Fri.  Take 1.25 mg at night on Sun, Tue, Thur, and Sat)   No facility-administered encounter medications on file as of 01/09/2019.    Surgical History: Past Surgical History:  Procedure Laterality Date  . ABDOMINAL HYSTERECTOMY     total  . ABDOMINAL HYSTERECTOMY    . BREAST BIOPSY Bilateral 2012  . BREAST BIOPSY Right 08-07-12   fibroadenomatous changes and columnar cells  . BREAST BIOPSY  02/03/2015   stereo byrnett  . CHOLECYSTECTOMY N/A 02/27/2016   Procedure: LAPAROSCOPIC CHOLECYSTECTOMY;  Surgeon: Christene Lye, MD;  Location: [facility] ORS;  Service: General;  Laterality: N/A;  . COLONOSCOPY WITH PROPOFOL N/A 09/26/2018   Procedure: COLONOSCOPY WITH PROPOFOL;  Surgeon: Lucilla Lame, MD;  Location: [facility] ENDOSCOPY;  Service: Endoscopy;  Laterality: N/A;  . CYSTOSCOPY W/ RETROGRADES Right 08/18/2018   Procedure: CYSTOSCOPY WITH RETROGRADE PYELOGRAM;  Surgeon: Billey Co, MD;  Location: [facility] ORS;  Service: Urology;  Laterality: Right;  . CYSTOSCOPY/URETEROSCOPY/HOLMIUM LASER/STENT PLACEMENT Right 08/18/2018   Procedure: CYSTOSCOPY/URETEROSCOPY/STENT PLACEMENT;  Surgeon: Billey Co, MD;  Location: [facility] ORS;  Service: Urology;  Laterality: Right;  . ESOPHAGOGASTRODUODENOSCOPY (EGD) WITH PROPOFOL N/A 09/26/2018   Procedure: ESOPHAGOGASTRODUODENOSCOPY (EGD) WITH PROPOFOL;  Surgeon: Lucilla Lame, MD;  Location: [facility] ENDOSCOPY;  Service: Endoscopy;  Laterality: N/A;  . GIVENS CAPSULE STUDY N/A  11/03/2018   Procedure: GIVENS CAPSULE STUDY;  Surgeon: Lucilla Lame, MD;  Location[facility]COPY;  Service: Endoscopy;  Laterality: N/A;  . JOINT REPLACEMENT Left   . KNEE CLOSED REDUCTION Left 04/15/2015   Procedure: CLOSED MANIPULATION KNEE;  Surgeon: Thornton Park, MD;  Location: [facility] ORS;  Service: Orthopedics;  Laterality: Left;  . KNEE SURGERY    . TEE WITHOUT CARDIOVERSION N/A 09/13/2017   Procedure: TRANSESOPHAGEAL ECHOCARDIOGRAM (TEE);  Surgeon: Nelva Bush, MD;  Location: [facility] ORS;  Service: Cardiovascular;  Laterality: N/A;  . TOTAL KNEE ARTHROPLASTY Left 12/25/2014   Procedure: TOTAL KNEE ARTHROPLASTY;  Surgeon: Thornton Park, MD;  Location: [facility] ORS;  Service: Orthopedics;  Laterality: Left;  . TUBAL LIGATION      Medical History: Past Medical History:  Diagnosis Date  . (HFpEF) heart failure with preserved ejection fraction ([place])    a. 08/2017 Echo: EF 55-60%.  Grade 2 diastolic dysfunction.  Moderate mitral stenosis.  . Allergy   . Anemia   . Anxiety   . BRCA negative 03/22/2013  . Bronchitis 02/19/2016   ON LEVAQUIN PO  . Chest tightness   . Cigarette smoker 09/11/2017   8-10 day  . GERD (gastroesophageal reflux disease)   . History of kidney stones   . Hyperlipidemia   . Hypertension   . Interstitial lung disease ([place])    a. CT 2013 b. 02/2018 CXR noted recurrent intersistial changes ILD vs chronic bronchitis  . Moderate mitral stenosis    a.  08/2017 TEE: EF 60 to 65%.  Moderate mitral stenosis.  Mean gradient 14 mmHg.  Valve area 2.59 cm by planimetry, 2.72 cm by pressure half-time.  . Persistent atrial fibrillation ([place])    a. 08/2017 s/p TEE/DCCV; b. CHA2DS2VASc = 2-->warfarin.    Family History: Family History  Problem Relation Age of Onset  . Cancer Mother 69       breast  . Hypertension Mother   . Cancer Maternal Aunt        breast  . Cancer Maternal Grandmother        breast  . Osteoarthritis Father   . Hypertension Father      Social History   Socioeconomic History  . Marital status: Married    Spouse name: Not on file  . Number of children: Not on file  . Years of education: Not on file  . Highest education level: Not on file  Occupational History  . Not on file  Tobacco Use  . Smoking status: Former Smoker    Packs/day: 0.50    Years: 20.00    Pack years: 10.00    Types: Cigarettes    Quit date: 02/20/2018    Years since quitting: 0.9  . Smokeless tobacco: Never Used  . Tobacco comment: down to 2-3 daily  Substance and Sexual Activity  . Alcohol use: Yes    Comment: rarely  . Drug use: No  . Sexual activity: Not on file  Other Topics Concern  . Not on file  Social History Narrative  . Not on file  Social Determinants of Health   Financial Resource Strain:   . Difficulty of Paying Living Expenses: Not on file  Food Insecurity:   . Worried About Charity fundraiser in the Last Year: Not on file  . Ran Out of Food in the Last Year: Not on file  Transportation Needs:   . Lack of Transportation (Medical): Not on file  . Lack of Transportation (Non-Medical): Not on file  Physical Activity:   . Days of Exercise per Week: Not on file  . Minutes of Exercise per Session: Not on file  Stress:   . Feeling of Stress : Not on file  Social Connections:   . Frequency of Communication with Friends and Family: Not on file  . Frequency of Social Gatherings with Friends and Family: Not on file  . Attends Religious Services: Not on file  . Active Member of Clubs or Organizations: Not on file  . Attends Archivist Meetings: Not on file  . Marital Status: Not on file  Intimate Partner Violence:   . Fear of Current or Ex-Partner: Not on file  . Emotionally Abused: Not on file  . Physically Abused: Not on file  . Sexually Abused: Not on file      Review of Systems  Constitutional: Negative for chills, fatigue and unexpected weight change.  HENT: Negative for congestion, rhinorrhea,  sneezing and sore throat.   Eyes: Negative for photophobia, pain and redness.  Respiratory: Negative for cough, chest tightness and shortness of breath.   Cardiovascular: Negative for chest pain and palpitations.  Gastrointestinal: Negative for abdominal pain, constipation, diarrhea, nausea and vomiting.  Endocrine: Negative.   Genitourinary: Negative for dysuria and frequency.  Musculoskeletal: Negative for arthralgias, back pain, joint swelling and neck pain.  Skin: Negative for rash.  Allergic/Immunologic: Negative.   Neurological: Negative for tremors and numbness.  Hematological: Negative for adenopathy. Does not bruise/bleed easily.  Psychiatric/Behavioral: Negative for behavioral problems and sleep disturbance. The patient is not nervous/anxious.     Vital Signs: BP 126/70   Pulse 69   Temp (!) 97.1 F (36.2 C)   Resp 16   Ht '5\' 4"'  (1.626 m)   Wt 244 lb (110.7 kg)   SpO2 92%   BMI 41.88 kg/m    Physical Exam Vitals and nursing note reviewed.  Constitutional:      General: She is not in acute distress.    Appearance: She is well-developed. She is not diaphoretic.  HENT:     Head: Normocephalic and atraumatic.     Mouth/Throat:     Pharynx: No oropharyngeal exudate.  Eyes:     Pupils: Pupils are equal, round, and reactive to light.  Neck:     Thyroid: No thyromegaly.     Vascular: No JVD.     Trachea: No tracheal deviation.  Cardiovascular:     Rate and Rhythm: Normal rate and regular rhythm.     Heart sounds: Normal heart sounds. No murmur. No friction rub. No gallop.   Pulmonary:     Effort: Pulmonary effort is normal. No respiratory distress.     Breath sounds: Normal breath sounds. No wheezing or rales.  Chest:     Chest wall: No tenderness.  Abdominal:     Palpations: Abdomen is soft.     Tenderness: There is no abdominal tenderness. There is no guarding.  Musculoskeletal:        General: Normal range of motion.     Cervical back: Normal range  of  motion and neck supple.  Lymphadenopathy:     Cervical: No cervical adenopathy.  Skin:    General: Skin is warm and dry.  Neurological:     Mental Status: She is alert and oriented to person, place, and time.     Cranial Nerves: No cranial nerve deficit.  Psychiatric:        Behavior: Behavior normal.        Thought Content: Thought content normal.        Judgment: Judgment normal.     Assessment/Plan: 1. Acquired cyst of kidney Follow up with Urology as scheduled.  Discussed importance of controlling blood pressure.  2. Loud snoring Will get home sleep study to evaluate for OSa.  - Home sleep test  3. Paroxysmal atrial fibrillation (HCC) Continue to take all medications as prescribed, and follow up with cardiology as scheduled.   General Counseling: Lindey verbalizes understanding of the findings of todays visit and agrees with plan of treatment. I have discussed any further diagnostic evaluation that may be needed or ordered today. We also reviewed her medications today. she has been encouraged to call the office with any questions or concerns that should arise related to todays visit.    Orders Placed This Encounter  Procedures  . Home sleep test    No orders of the defined types were placed in this encounter.   Time spent: 25 Minutes   This patient was seen by Orson Gear AGNP-C in Collaboration with Dr Lavera Guise as a part of collaborative care agreement     Kendell Bane AGNP-C Internal medicine

## 2019-01-22 ENCOUNTER — Other Ambulatory Visit
Admission: RE | Admit: 2019-01-22 | Discharge: 2019-01-22 | Disposition: A | Discharge status: Home / Self Care | Payer: Managed Care, Other (non HMO) | Source: Ambulatory Visit | Attending: Cardiovascular Disease | Admitting: Cardiovascular Disease

## 2019-01-22 ENCOUNTER — Ambulatory Visit (INDEPENDENT_AMBULATORY_CARE_PROVIDER_SITE_OTHER): Payer: Managed Care, Other (non HMO)

## 2019-01-22 ENCOUNTER — Other Ambulatory Visit: Payer: Self-pay

## 2019-01-22 DIAGNOSIS — I050 Rheumatic mitral stenosis: Secondary | ICD-10-CM | POA: Insufficient documentation

## 2019-01-22 DIAGNOSIS — R791 Abnormal coagulation profile: Secondary | ICD-10-CM

## 2019-01-22 DIAGNOSIS — Z5181 Encounter for therapeutic drug level monitoring: Secondary | ICD-10-CM | POA: Insufficient documentation

## 2019-01-22 DIAGNOSIS — I4891 Unspecified atrial fibrillation: Secondary | ICD-10-CM | POA: Insufficient documentation

## 2019-01-22 LAB — POCT INR
INR: 4.8 — AB (ref 2.0–3.0)
INR: 8 — AB (ref 2.0–3.0)

## 2019-01-22 LAB — PROTIME-INR
INR: 4.8 (ref 0.8–1.2)
Prothrombin Time: 44.8 seconds — ABNORMAL HIGH (ref 11.4–15.2)

## 2019-01-22 NOTE — Patient Instructions (Addendum)
-  Hold warfarin today and tomorrow - then resume dosage of 1.25 mg every day EXCEPT 1 mg on MONDAYS, [place]. Recheck in 1 week.

## 2019-01-22 NOTE — Progress Notes (Signed)
L cardiology Office Note  Date:  01/23/2019   ID:  Leslie [name], DOB 1965-10-26, MRN [id number]  PCP:  Ronnell Freshwater, NP   Chief Complaint  Patient presents with  . other    1 month fu c/o afib and pt needs cardiac clearance for knee surgery w/Dr.Hooten. Meds reviewed verbally with pt.    HPI:  Leslie [name] is a 54 y.o. female with a hx of  mitral valve stenosis morbid obesity,  Snores, possible OSA anxiety,  hyperlipidemia,  Hospital admission 09/11/2017 for  shortness of breath chest tightness in atrial fibrillation with RVR Had TEE  , Cardioversion Mild COPD, long hx of smoking Cardiac CTA Coronary calcium score of 10. Who presents for follow up of her atrial fibrillation, mitral valve stenosis  In general she reports that she is feeling well Able to exert herself without significant shortness of breath Goes walking with her husband, does clean up around the house Does not feel restricted at this time  She does report having one episode of tachycardia last weekend lasting 30 minutes Took metoprolol, went away Thinks it might have been atrial fibrillation  More fluid around ankles Continues to take torsemide 40 twice daily She has had high fluid intake  Takes potassium 20 daily Review of lab work with her shows chronically low potassium 3.0 sometimes lower  Reports having chronic knee pain, scheduled for knee replacement surgery, followed by Dr. Marry Guan  EKG personally reviewed by myself on todays visit Shows normal sinus rhythm rate 77 bpm no significant ST-T wave changes  Other past medical history reviewed Echocardiogram August 2020  mitral valve Moderate thickening of the mitral valve leaflet. Moderate-severe mitral valve stenosis, with mean gradient of 16 mmHg and MVA by continuity equation of 1.0 cm^2.   Transesophageal echo August 2019 with moderate mitral valve stenosis mean gradient of 14 Mobility of the posterior leaflet was restricted.   summer  2020 developed right flank pain,   underwent right ureteroscopy, laser lithotripsy, and stent placement on 08/18/2018.     After stent removal, the patient reports progressive generalized malaise, fatigue, and nausea.  For continued pain she was taking  naproxen (up to 800 mg).  seen at urgent care and prescribed trimethoprim/sulfamethoxazole and tamsulosin.   continued malaise, the patient presented to [facility] ED on 08/26/2018, complaining of persistent flank pain as well as shortness of breath, palpitations, and chills.     CT of the abdomen and pelvis showed enlarged right kidney with diffuse perinephric edema and mild dilation of the right collecting system and right ureter.    creatinine of 2.1 (up from 1.3 on 08/15/2018).   Creatinine trended up  to 4.1  Since has recovered creatinine 1.6  She had acute kidney injury and sepsis in the setting of recent right ureteral stone status post lithotripsy and likely pyelonephritis.  atrial fibrillation December 2019 01/05/2018 awoke suddenly with tachypalpitations  rates in the 130s   EMS was called, noted to be in sinus rhythm with heart rates in the 80s to 90s bpm.    continued paroxysmal symptoms  seen on 12/20.   EKG at that time showed sinus rhythm.  Her diltiazem was increased to 240 mg daily.   Cardiac CTA 1. Coronary calcium score of 10. This was 45 percentile for age and sex matched control.. Normal coronary origin with right dominance.Mild non-obstructive CAD.   PMH:   has a past medical history of (HFpEF) heart failure with preserved ejection fraction ([place]),  Allergy, Anemia, Anxiety, BRCA negative (03/22/2013), Bronchitis (02/19/2016), Chest tightness, Cigarette smoker (09/11/2017), GERD (gastroesophageal reflux disease), History of kidney stones, Hyperlipidemia, Hypertension, Interstitial lung disease ([place]), Moderate mitral stenosis, and Persistent atrial fibrillation ([place]).  PSH:    Past Surgical History:  Procedure Laterality  Date  . ABDOMINAL HYSTERECTOMY     total  . ABDOMINAL HYSTERECTOMY    . BREAST BIOPSY Bilateral 2012  . BREAST BIOPSY Right 08-07-12   fibroadenomatous changes and columnar cells  . BREAST BIOPSY  02/03/2015   stereo byrnett  . CHOLECYSTECTOMY N/A 02/27/2016   Procedure: LAPAROSCOPIC CHOLECYSTECTOMY;  Surgeon: Christene Lye, MD;  Location: [facility] ORS;  Service: General;  Laterality: N/A;  . COLONOSCOPY WITH PROPOFOL N/A 09/26/2018   Procedure: COLONOSCOPY WITH PROPOFOL;  Surgeon: Lucilla Lame, MD;  Location: [facility] ENDOSCOPY;  Service: Endoscopy;  Laterality: N/A;  . CYSTOSCOPY W/ RETROGRADES Right 08/18/2018   Procedure: CYSTOSCOPY WITH RETROGRADE PYELOGRAM;  Surgeon: Billey Co, MD;  Location: [facility] ORS;  Service: Urology;  Laterality: Right;  . CYSTOSCOPY/URETEROSCOPY/HOLMIUM LASER/STENT PLACEMENT Right 08/18/2018   Procedure: CYSTOSCOPY/URETEROSCOPY/STENT PLACEMENT;  Surgeon: Billey Co, MD;  Location: [facility] ORS;  Service: Urology;  Laterality: Right;  . ESOPHAGOGASTRODUODENOSCOPY (EGD) WITH PROPOFOL N/A 09/26/2018   Procedure: ESOPHAGOGASTRODUODENOSCOPY (EGD) WITH PROPOFOL;  Surgeon: Lucilla Lame, MD;  Location: [facility] ENDOSCOPY;  Service: Endoscopy;  Laterality: N/A;  . GIVENS CAPSULE STUDY N/A 11/03/2018   Procedure: GIVENS CAPSULE STUDY;  Surgeon: Lucilla Lame, MD;  Location: [facility] ENDOSCOPY;  Service: Endoscopy;  Laterality: N/A;  . JOINT REPLACEMENT Left   . KNEE CLOSED REDUCTION Left 04/15/2015   Procedure: CLOSED MANIPULATION KNEE;  Surgeon: Thornton Park, MD;  Location: [facility] ORS;  Service: Orthopedics;  Laterality: Left;  . KNEE SURGERY    . TEE WITHOUT CARDIOVERSION N/A 09/13/2017   Procedure: TRANSESOPHAGEAL ECHOCARDIOGRAM (TEE);  Surgeon: Nelva Bush, MD;  Location: [facility] ORS;  Service: Cardiovascular;  Laterality: N/A;  . TOTAL KNEE ARTHROPLASTY Left 12/25/2014   Procedure: TOTAL KNEE ARTHROPLASTY;  Surgeon: Thornton Park, MD;  Location: [facility] ORS;  Service:  Orthopedics;  Laterality: Left;  . TUBAL LIGATION      Current Outpatient Medications on File Prior to Visit  Medication Sig Dispense Refill  . albuterol (VENTOLIN HFA) 108 (90 Base) MCG/ACT inhaler Inhale 2 puffs into the lungs every 4 (four) hours as needed for wheezing or shortness of breath. 3 Inhaler 0  . ALPRAZolam (XANAX) 0.5 MG tablet Take 1 tablet (0.5 mg total) by mouth at bedtime as needed for anxiety. 30 tablet 2  . atorvastatin (LIPITOR) 80 MG tablet Take 1 tablet (80 mg total) by mouth daily at 6 PM. 90 tablet 3  . diltiazem (CARDIZEM CD) 120 MG 24 hr capsule Take 1 capsule (120 mg total) by mouth daily. 90 capsule 3  . estradiol (ESTRACE) 0.5 MG tablet Take 1 tablet (0.5 mg total) by mouth daily. 90 tablet 1  . metoprolol succinate (TOPROL-XL) 50 MG 24 hr tablet Take 1-2 tablets (50-100 mg total) by mouth as directed. Take 1 tablet (50 mg) in the Morning & 2 tablets (100 mg) at night. 270 tablet 3  . omeprazole (PRILOSEC) 40 MG capsule Take 1 capsule (40 mg total) by mouth daily. 90 capsule 1  . potassium chloride (KLOR-CON) 10 MEQ tablet Take 1 tablet (10 mEq total) by mouth 2 (two) times daily. (Patient taking differently: Take 20 mEq by mouth 2 (two) times daily. ) 8 tablet 0  . torsemide (DEMADEX) 20 MG tablet Take  2 tablets (40 mg) by mouth in the morning and take 1 tablet (20 mg) by mouth at 2 pm. 270 tablet 2  . warfarin (COUMADIN) 2 MG tablet Take as directed by the Coumadin Clinic 30 tablet 0   No current facility-administered medications on file prior to visit.    Allergies:   Morphine, Oxycodone hcl, and Dilaudid [hydromorphone hcl]   Social History:  The patient  reports that she quit smoking about 11 months ago. Her smoking use included cigarettes. She has a 10.00 pack-year smoking history. She has never used smokeless tobacco. She reports current alcohol use. She reports that she does not use drugs.   Family History:   family history includes Cancer in her  maternal aunt and maternal grandmother; Cancer (age of onset: 14) in her mother; Hypertension in her father and mother; Osteoarthritis in her father.    Review of Systems: Review of Systems  Constitutional: Negative.   Respiratory: Negative.   Cardiovascular: Positive for leg swelling.  Gastrointestinal: Negative.   Musculoskeletal: Positive for joint pain.  Neurological: Negative.   Psychiatric/Behavioral: Negative.   All other systems reviewed and are negative.   PHYSICAL EXAM: VS:  BP 128/78 (BP Location: Left Arm, Patient Position: Sitting, Cuff Size: Large)   Pulse 77   Ht 5' 4" (1.626 m)   Wt 244 lb 12 oz (111 kg)   SpO2 99%   BMI 42.01 kg/m  , BMI Body mass index is 42.01 kg/m. Constitutional:  oriented to person, place, and time. No distress.  Obese HENT:  Head: Grossly normal Eyes:  no discharge. No scleral icterus.  Neck: No JVD, no carotid bruits  Cardiovascular: Regular rate and rhythm, 2/6  Murmur left axilla Pulmonary/Chest: Clear to auscultation bilaterally, no wheezes or rails Abdominal: Soft.  no distension.  no tenderness.  Musculoskeletal: Normal range of motion Neurological:  normal muscle tone. Coordination normal. No atrophy Skin: Skin warm and dry Psychiatric: normal affect, pleasant  Recent Labs: 08/26/2018: B Natriuretic Peptide 539.0; TSH 0.864 09/19/2018: ALT 20 11/22/2018: Hemoglobin 11.2; Platelets 288 11/28/2018: Magnesium 1.9 12/25/2018: BUN 15; Creatinine, Ser 1.34; Potassium 3.0; Sodium 139    Lipid Panel Lab Results  Component Value Date   CHOL 171 09/19/2018   HDL 33 (L) 09/19/2018   LDLCALC 101 (H) 09/19/2018   TRIG 186 (H) 09/19/2018     Wt Readings from Last 3 Encounters:  01/23/19 244 lb 12 oz (111 kg)  01/09/19 244 lb (110.7 kg)  12/25/18 246 lb (111.6 kg)      ASSESSMENT AND PLAN:  Preop cardiovascular evaluation No further cardiac testing needed at this time Acceptable risk for total knee replacement with Dr.  Marry Guan Able to achieve 4 METS without any difficulty Would suggest IV fluids be minimized perioperatively, postop given history of diastolic CHF  Persistent atrial fibrillation ([place]) - Plan: EKG 12-Lead Maintaining normal sinus rhythm Likely had one recent episode lasting 30 minutes Likely exacerbated by mitral valve disease No changes to her medications On warfarin  Mitral valve stenosis, unspecified etiology  moderate to severe on echocardiogram Echocardiogram scheduled for summer 2021 Apart from lower extremity edema she denies having significant shortness of breath symptoms  Essential hypertension No changes to her medications  OSA (obstructive sleep apnea) managed by primary care Not on CPAP  Lower extremity edema/chronic diastolic CHF  exacerbated by morbid obesity and atrial fibrillation, as well as mitral valve stenosis, diastolic dysfunction --Continue torsemide 40 twice daily -Increase potassium up to 30 mEq in  the morning and 20 mEq in the afternoon Moderate fluid intake  Acute on chronic renal failure Continue torsemide 40 daily with potassium 50 daily  Disposition:   F/U  6 month  Long discussion concerning her mitral valve, need for periodic echocardiography, and if stenosis becomes severe and she is symptomatic would refer to [facility] for consultation with surgery Currently she reports she is asymptomatic  Total encounter time more than 45 minutes  Greater than 50% was spent in counseling and coordination of care with the patient    No orders of the defined types were placed in this encounter.    Signed, Esmond Plants, M.D., Ph.D. 01/23/2019  [place], [place]

## 2019-01-23 ENCOUNTER — Ambulatory Visit (INDEPENDENT_AMBULATORY_CARE_PROVIDER_SITE_OTHER): Payer: Managed Care, Other (non HMO) | Admitting: Cardiovascular Disease

## 2019-01-23 ENCOUNTER — Encounter: Payer: Self-pay | Admitting: Cardiovascular Disease

## 2019-01-23 VITALS — BP 128/78 | HR 77 | Ht 64.0 in | Wt 244.8 lb

## 2019-01-23 DIAGNOSIS — I5032 Chronic diastolic (congestive) heart failure: Secondary | ICD-10-CM | POA: Diagnosis not present

## 2019-01-23 DIAGNOSIS — I4891 Unspecified atrial fibrillation: Secondary | ICD-10-CM

## 2019-01-23 DIAGNOSIS — E7800 Pure hypercholesterolemia, unspecified: Secondary | ICD-10-CM

## 2019-01-23 DIAGNOSIS — I050 Rheumatic mitral stenosis: Secondary | ICD-10-CM

## 2019-01-23 DIAGNOSIS — F17200 Nicotine dependence, unspecified, uncomplicated: Secondary | ICD-10-CM | POA: Diagnosis not present

## 2019-01-23 MED ORDER — POTASSIUM CHLORIDE CRYS ER 10 MEQ PO TBCR: 50.0000 meq | EXTENDED_RELEASE_TABLET | ORAL | 3 refills | Status: DC

## 2019-01-23 MED ORDER — TORSEMIDE 20 MG PO TABS: 40.0000 mg | ORAL_TABLET | Freq: Two times a day (BID) | ORAL | 3 refills | Status: DC

## 2019-01-23 NOTE — Patient Instructions (Addendum)
Medication Instructions:  Your physician has recommended you make the following change in your medication:  1. INCREASE Potassium 10 mEq to 3 tablets (30 mEq) in the AM and 2 tablets (20 mEq) in the PM 2. INCREASE Torsemide 20 mg to 2 tablets (40 mg) twice a day  If you need a refill on your cardiac medications before your next appointment, please call your pharmacy.    Lab work: No new labs needed   If you have labs (blood work) drawn today and your tests are completely normal, you will receive your results only by: Marland Kitchen MyChart Message (if you have MyChart) OR . A paper copy in the mail If you have any lab test that is abnormal or we need to change your treatment, we will call you to review the results.   Testing/Procedures: Echo in the summer for mitral valve stenosis, shortness of breath Your physician has requested that you have an echocardiogram. Echocardiography is a painless test that uses sound waves to create images of your heart. It provides your doctor with information about the size and shape of your heart and how well your heart's chambers and valves are working. This procedure takes approximately one hour. There are no restrictions for this procedure.   Follow-Up: At [facility], you and your health needs are our priority.  As part of our continuing mission to provide you with exceptional heart care, we have created designated Provider Care Teams.  These Care Teams include your primary Cardiologist (physician) and Advanced Practice Providers (APPs -  Physician Assistants and Nurse Practitioners) who all work together to provide you with the care you need, when you need it.  . You will need a follow up appointment in 6 months after echo   . Providers on your designated Care Team:   . Murray Hodgkins, NP . Christell Faith, PA-C . Marrianne Mood, PA-C  Any Other Special Instructions Will Be Listed Below (If Applicable).  For educational health videos Log in to :  [email or web address] Or : [email or web address], password : [email or web address]

## 2019-01-29 ENCOUNTER — Ambulatory Visit (INDEPENDENT_AMBULATORY_CARE_PROVIDER_SITE_OTHER): Payer: Managed Care, Other (non HMO)

## 2019-01-29 ENCOUNTER — Other Ambulatory Visit: Payer: Self-pay

## 2019-01-29 DIAGNOSIS — I4891 Unspecified atrial fibrillation: Secondary | ICD-10-CM | POA: Diagnosis not present

## 2019-01-29 DIAGNOSIS — Z5181 Encounter for therapeutic drug level monitoring: Secondary | ICD-10-CM | POA: Diagnosis not present

## 2019-01-29 DIAGNOSIS — I050 Rheumatic mitral stenosis: Secondary | ICD-10-CM

## 2019-01-29 LAB — POCT INR: INR: 7.1 — AB (ref 2.0–3.0)

## 2019-01-29 NOTE — Patient Instructions (Signed)
-   Hold warfarin today, tomorrow, & Wednesday - on Thursday, then resume dosage of 1.25 mg (half the 2.5 mg tablet) every day EXCEPT 1 mg (half the 1 mg tablet) on MONDAYS, [place]. -  Recheck in 1 week.

## 2019-02-01 ENCOUNTER — Ambulatory Visit: Payer: Managed Care, Other (non HMO) | Attending: Internal Medicine

## 2019-02-01 DIAGNOSIS — Z20822 Contact with and (suspected) exposure to covid-19: Secondary | ICD-10-CM

## 2019-02-02 ENCOUNTER — Other Ambulatory Visit: Payer: Self-pay | Admitting: Cardiovascular Disease

## 2019-02-02 LAB — NOVEL CORONAVIRUS, NAA: SARS-CoV-2, NAA: NOT DETECTED

## 2019-02-02 NOTE — Telephone Encounter (Signed)
Refill Request.  

## 2019-02-05 ENCOUNTER — Other Ambulatory Visit: Payer: Self-pay

## 2019-02-05 ENCOUNTER — Ambulatory Visit (INDEPENDENT_AMBULATORY_CARE_PROVIDER_SITE_OTHER): Payer: Managed Care, Other (non HMO)

## 2019-02-05 ENCOUNTER — Other Ambulatory Visit: Payer: Managed Care, Other (non HMO)

## 2019-02-05 DIAGNOSIS — I4891 Unspecified atrial fibrillation: Secondary | ICD-10-CM | POA: Diagnosis not present

## 2019-02-05 DIAGNOSIS — Z5181 Encounter for therapeutic drug level monitoring: Secondary | ICD-10-CM | POA: Diagnosis not present

## 2019-02-05 DIAGNOSIS — I050 Rheumatic mitral stenosis: Secondary | ICD-10-CM | POA: Diagnosis not present

## 2019-02-05 LAB — POCT INR: INR: 4.2 — AB (ref 2.0–3.0)

## 2019-02-05 NOTE — Patient Instructions (Signed)
-   Hold warfarin today, tomorrow, & Wednesday - per urology - on Thursday if ok'd by MD, then resume dosage of 1.25 mg (half the 2.5 mg tablet) every day EXCEPT 1 mg (half the 1 mg tablet) on MONDAYS, [place].  -  Recheck in 2 weeks.

## 2019-02-12 ENCOUNTER — Other Ambulatory Visit: Payer: Self-pay

## 2019-02-13 ENCOUNTER — Other Ambulatory Visit: Payer: Self-pay

## 2019-02-13 ENCOUNTER — Other Ambulatory Visit: Payer: Self-pay | Admitting: Nurse Practitioner

## 2019-02-13 DIAGNOSIS — K219 Gastro-esophageal reflux disease without esophagitis: Secondary | ICD-10-CM

## 2019-02-13 DIAGNOSIS — F419 Anxiety disorder, unspecified: Secondary | ICD-10-CM

## 2019-02-13 MED ORDER — ALPRAZOLAM 0.5 MG PO TABS: 0.5000 mg | ORAL_TABLET | Freq: Every evening | ORAL | 2 refills | Status: DC | PRN

## 2019-02-13 MED ORDER — OMEPRAZOLE 40 MG PO CPDR: 40.0000 mg | DELAYED_RELEASE_CAPSULE | Freq: Every day | ORAL | 1 refills | Status: DC

## 2019-02-13 NOTE — Progress Notes (Signed)
Approved refill for alprazolam with two additional refills per pharmacy request.

## 2019-02-16 ENCOUNTER — Other Ambulatory Visit: Payer: Self-pay

## 2019-02-16 ENCOUNTER — Telehealth: Payer: Self-pay

## 2019-02-16 NOTE — Telephone Encounter (Signed)
lmom for patient to confirm and screen for 02-20-19 ov.

## 2019-02-19 ENCOUNTER — Ambulatory Visit: Payer: Self-pay | Admitting: Surgery

## 2019-02-19 ENCOUNTER — Other Ambulatory Visit: Payer: Self-pay

## 2019-02-19 ENCOUNTER — Ambulatory Visit (INDEPENDENT_AMBULATORY_CARE_PROVIDER_SITE_OTHER): Payer: Managed Care, Other (non HMO)

## 2019-02-19 ENCOUNTER — Ambulatory Visit: Payer: Managed Care, Other (non HMO) | Admitting: Urology

## 2019-02-19 DIAGNOSIS — I050 Rheumatic mitral stenosis: Secondary | ICD-10-CM

## 2019-02-19 DIAGNOSIS — Z5181 Encounter for therapeutic drug level monitoring: Secondary | ICD-10-CM

## 2019-02-19 DIAGNOSIS — I4891 Unspecified atrial fibrillation: Secondary | ICD-10-CM | POA: Diagnosis not present

## 2019-02-19 LAB — POCT INR: INR: 1.4 — AB (ref 2.0–3.0)

## 2019-02-19 NOTE — Patient Instructions (Signed)
-   Take 1 whole 2.5 mg tablet today,  - tomorrow resume dosage of 1.25 mg (half the 2.5 mg tablet) every day EXCEPT 1 mg (half the 1 mg tablet) on MONDAYS, [place].  -  Recheck in 2 weeks.

## 2019-02-20 ENCOUNTER — Ambulatory Visit (INDEPENDENT_AMBULATORY_CARE_PROVIDER_SITE_OTHER): Payer: Managed Care, Other (non HMO) | Admitting: Nurse Practitioner

## 2019-02-20 ENCOUNTER — Encounter: Payer: Self-pay | Admitting: Nurse Practitioner

## 2019-02-20 VITALS — BP 138/60 | HR 68 | Temp 97.4°F | Resp 16 | Ht 64.0 in | Wt 239.0 lb

## 2019-02-20 DIAGNOSIS — N200 Calculus of kidney: Secondary | ICD-10-CM

## 2019-02-20 DIAGNOSIS — B3731 Acute candidiasis of vulva and vagina: Secondary | ICD-10-CM

## 2019-02-20 DIAGNOSIS — B370 Candidal stomatitis: Secondary | ICD-10-CM

## 2019-02-20 DIAGNOSIS — Z0001 Encounter for general adult medical examination with abnormal findings: Secondary | ICD-10-CM | POA: Diagnosis not present

## 2019-02-20 DIAGNOSIS — I4891 Unspecified atrial fibrillation: Secondary | ICD-10-CM

## 2019-02-20 DIAGNOSIS — B373 Candidiasis of vulva and vagina: Secondary | ICD-10-CM

## 2019-02-20 DIAGNOSIS — R300 Dysuria: Secondary | ICD-10-CM

## 2019-02-20 LAB — POCT URINALYSIS DIPSTICK
Bilirubin, UA: NEGATIVE
Glucose, UA: NEGATIVE
Ketones, UA: NEGATIVE
Leukocytes, UA: NEGATIVE
Nitrite, UA: NEGATIVE
Protein, UA: POSITIVE — AB
Spec Grav, UA: 1.01 (ref 1.010–1.025)
Urobilinogen, UA: 0.2 E.U./dL
pH, UA: 6.5 (ref 5.0–8.0)

## 2019-02-20 MED ORDER — NYSTATIN 100000 UNIT/ML MT SUSP: 5.0000 mL | Freq: Four times a day (QID) | OROMUCOSAL | 1 refills | Status: DC

## 2019-02-20 MED ORDER — FLUCONAZOLE 150 MG PO TABS: ORAL_TABLET | ORAL | 0 refills | Status: DC

## 2019-02-20 NOTE — Progress Notes (Signed)
[facility] [place], [place] [address]  Internal MEDICINE  Office Visit Note  Patient Name: Leslie [name]  [id number]  [id number]  Date of Service: 02/21/2019   Pt is here for routine health maintenance examination  Chief Complaint  Patient presents with  . Annual Exam  . Hypertension  . Hyperlipidemia  . Anxiety     The patient is here for health maintenance exam.  The patient did have visit with new urologist in [place] a few weeks ago. They were unable to catch the stone via cystoscopy. She had to have another stent placed. This was removed on 02/16/2019. She does have some vaginal itching and irritation. She had been placed on prophylactic antibiotics prior to stent placement. Today, urine sample is positive for trace blood and protein. They had intended to send in diflucan. They were advised, per pharmacy, that this negatively interacts with warfarin, so they did not send this prescription. The patient does have a fib. She is followed by coumadin weekly and has INR checked every week.  She states that she is, otherwise, feeling well. Blood pressure is well managed. She continues to be a non-smoker. Has not had flare of bronchitis or asthma since she quit smoking.  She states that she has a bad taste in her mouth. Mouth feels sticky and everything tastes bad. States that this has been going on since she was on antibiotics for stent placement.     Current Medication: Outpatient Encounter Medications as of 02/20/2019  Medication Sig  . albuterol (VENTOLIN HFA) 108 (90 Base) MCG/ACT inhaler Inhale 2 puffs into the lungs every 4 (four) hours as needed for wheezing or shortness of breath.  . ALPRAZolam (XANAX) 0.5 MG tablet Take 1 tablet (0.5 mg total) by mouth at bedtime as needed for anxiety.  Marland Kitchen atorvastatin (LIPITOR) 80 MG tablet Take 1 tablet (80 mg total) by mouth daily at 6 PM.  . diltiazem (CARDIZEM CD) 120 MG 24 hr capsule Take 1 capsule (120 mg  total) by mouth daily.  Marland Kitchen estradiol (ESTRACE) 0.5 MG tablet Take 1 tablet (0.5 mg total) by mouth daily.  . metoprolol succinate (TOPROL-XL) 50 MG 24 hr tablet Take 1-2 tablets (50-100 mg total) by mouth as directed. Take 1 tablet (50 mg) in the Morning & 2 tablets (100 mg) at night.  Marland Kitchen omeprazole (PRILOSEC) 40 MG capsule Take 1 capsule (40 mg total) by mouth daily.  . potassium chloride (KLOR-CON) 10 MEQ tablet Take 5 tablets (50 mEq total) by mouth as directed. Take 3 tablets (30 mEq) in the AM and 2 tablets (20 mEq) in the PM  . torsemide (DEMADEX) 20 MG tablet Take 2 tablets (40 mg total) by mouth 2 (two) times daily.  Marland Kitchen warfarin (COUMADIN) 2 MG tablet Take as directed by the Coumad[facility] Clinic  . fluconazole (DIFLUCAN) 150 MG tablet Take 1 tablet po once. May repeat dose in 3 days as needed for persistent symptoms.  Marland Kitchen nystatin (MYCOSTATIN) 100000 UNIT/ML suspension Take 5 mLs (500,000 Units total) by mouth 4 (four) times daily.   No facility-administered encounter medications on file as of 02/20/2019.    Surgical History: Past Surgical History:  Procedure Laterality Date  . ABDOMINAL HYSTERECTOMY     total  . ABDOMINAL HYSTERECTOMY    . BREAST BIOPSY Bilateral 2012  . BREAST BIOPSY Right 08-07-12   fibroadenomatous changes and columnar cells  . BREAST BIOPSY  02/03/2015   stereo byrnett  . CHOLECYSTECTOMY N/A 02/27/2016  Procedure: LAPAROSCOPIC CHOLECYSTECTOMY;  Surgeon: Christene Lye, MD;  Location: [facility];  Service: General;  Laterality: N/A;  . COLONOSCOPY WITH PROPOFOL N/A 09/26/2018   Procedure: COLONOSCOPY WITH PROPOFOL;  Surgeon: Lucilla Lame, MD;  Location: [facility];  Service: Endoscopy;  Laterality: N/A;  . CYSTOSCOPY W/ RETROGRADES Right 08/18/2018   Procedure: CYSTOSCOPY WITH RETROGRADE PYELOGRAM;  Surgeon: Billey Co, MD;  Location: [facility];  Service: Urology;  Laterality: Right;  . CYSTOSCOPY/URETEROSCOPY/HOLMIUM LASER/STENT PLACEMENT Right 08/18/2018    Procedure: CYSTOSCOPY/URETEROSCOPY/STENT PLACEMENT;  Surgeon: Billey Co, MD;  Location: [facility];  Service: Urology;  Laterality: Right;  . ESOPHAGOGASTRODUODENOSCOPY (EGD) WITH PROPOFOL N/A 09/26/2018   Procedure: ESOPHAGOGASTRODUODENOSCOPY (EGD) WITH PROPOFOL;  Surgeon: Lucilla Lame, MD;  Location: [facility];  Service: Endoscopy;  Laterality: N/A;  . GIVENS CAPSULE STUDY N/A 11/03/2018   Procedure: GIVENS CAPSULE STUDY;  Surgeon: Lucilla Lame, MD;  Location: [facility];  Service: Endoscopy;  Laterality: N/A;  . JOINT REPLACEMENT Left   . KNEE CLOSED REDUCTION Left 04/15/2015   Procedure: CLOSED MANIPULATION KNEE;  Surgeon: Thornton Park, MD;  Location: [facility];  Service: Orthopedics;  Laterality: Left;  . KNEE SURGERY    . TEE WITHOUT CARDIOVERSION N/A 09/13/2017   Procedure: TRANSESOPHAGEAL ECHOCARDIOGRAM (TEE);  Surgeon: Nelva Bush, MD;  Location: [facility];  Service: Cardiovascular;  Laterality: N/A;  . TOTAL KNEE ARTHROPLASTY Left 12/25/2014   Procedure: TOTAL KNEE ARTHROPLASTY;  Surgeon: Thornton Park, MD;  Location: [facility];  Service: Orthopedics;  Laterality: Left;  . TUBAL LIGATION      Medical History: Past Medical History:  Diagnosis Date  . (HFpEF) heart failure with preserved ejection fraction ([place])    a. 08/2017 Echo: EF 55-60%.  Grade 2 diastolic dysfunction.  Moderate mitral stenosis.  . Allergy   . Anemia   . Anxiety   . BRCA negative 03/22/2013  . Bronchitis 02/19/2016   ON LEVAQUIN PO  . Chest tightness   . Cigarette smoker 09/11/2017   8-10 day  . GERD (gastroesophageal reflux disease)   . History of kidney stones   . Hyperlipidemia   . Hypertension   . Interstitial lung disease ([place])    a. CT 2013 b. 02/2018 CXR noted recurrent intersistial changes ILD vs chronic bronchitis  . Moderate mitral stenosis    a.  08/2017 TEE: EF 60 to 65%.  Moderate mitral stenosis.  Mean gradient 14 mmHg.  Valve area 2.59 cm by planimetry, 2.72 cm by  pressure half-time.  . Persistent atrial fibrillation ([place])    a. 08/2017 s/p TEE/DCCV; b. CHA2DS2VASc = 2-->warfarin.    Family History: Family History  Problem Relation Age of Onset  . Cancer Mother 53       breast  . Hypertension Mother   . Cancer Maternal Aunt        breast  . Cancer Maternal Grandmother        breast  . Osteoarthritis Father   . Hypertension Father       Review of Systems  Constitutional: Negative for activity change, chills, fatigue and unexpected weight change.  HENT: Negative for congestion, postnasal drip, rhinorrhea, sneezing and sore throat.        Bad taste in mouth, feels dry, everything tastes bad.   Respiratory: Negative for cough, chest tightness, shortness of breath and wheezing.   Cardiovascular: Negative for chest pain and palpitations.  Gastrointestinal: Negative for abdominal pain, constipation, diarrhea, nausea and vomiting.  Endocrine: Negative for cold intolerance, heat intolerance, polydipsia  and polyuria.  Genitourinary: Positive for vaginal discharge. Negative for dysuria and frequency.       Vaginal itching.   Musculoskeletal: Negative for arthralgias, back pain, joint swelling and neck pain.  Skin: Negative for rash.  Allergic/Immunologic: Negative for environmental allergies.  Neurological: Negative for dizziness, tremors, numbness and headaches.  Hematological: Negative for adenopathy. Does not bruise/bleed easily.  Psychiatric/Behavioral: Negative for behavioral problems (Depression), sleep disturbance and suicidal ideas. The patient is not nervous/anxious.      Today's Vitals   02/20/19 1456  BP: 138/60  Pulse: 68  Resp: 16  Temp: (!) 97.4 F (36.3 C)  SpO2: 98%  Weight: 239 lb (108.4 kg)  Height: '5\' 4"'  (1.626 m)   Body mass index is 41.02 kg/m.  Physical Exam Vitals and nursing note reviewed.  Constitutional:      General: She is not in acute distress.    Appearance: Normal appearance. She is well-developed.  She is obese. She is not diaphoretic.  HENT:     Head: Normocephalic and atraumatic.     Nose: Nose normal.     Mouth/Throat:     Pharynx: No oropharyngeal exudate.     Comments: Plaques of thrush present on the tongue. Eyes:     Conjunctiva/sclera: Conjunctivae normal.     Pupils: Pupils are equal, round, and reactive to light.  Neck:     Thyroid: No thyromegaly.     Vascular: No carotid bruit or JVD.     Trachea: No tracheal deviation.  Cardiovascular:     Rate and Rhythm: Normal rate and regular rhythm.     Pulses: Normal pulses.     Heart sounds: Normal heart sounds. No murmur. No friction rub. No gallop.   Pulmonary:     Effort: Pulmonary effort is normal. No respiratory distress.     Breath sounds: Normal breath sounds. No wheezing or rales.  Chest:     Chest wall: No tenderness.     Breasts:        Right: Normal. No swelling, bleeding, inverted nipple, mass, nipple discharge, skin change or tenderness.        Left: Normal. No swelling, bleeding, inverted nipple, mass, nipple discharge, skin change or tenderness.  Abdominal:     General: Bowel sounds are normal.     Palpations: Abdomen is soft.     Tenderness: There is no abdominal tenderness.  Genitourinary:    Comments: Urine sample positive for protein and trace blood.  Musculoskeletal:        General: Normal range of motion.     Cervical back: Normal range of motion and neck supple.  Lymphadenopathy:     Cervical: No cervical adenopathy.     Upper Body:     Right upper body: No axillary adenopathy.     Left upper body: No axillary adenopathy.  Skin:    General: Skin is warm and dry.  Neurological:     Mental Status: She is alert and oriented to person, place, and time. Mental status is at baseline.     Cranial Nerves: No cranial nerve deficit.  Psychiatric:        Mood and Affect: Mood normal.        Behavior: Behavior normal.        Thought Content: Thought content normal.        Judgment: Judgment normal.       LABS: Recent Results (from the past 2160 hour(s))  POCT Urinalysis Dipstick     Status:  None   Collection Time: 11/27/18  8:35 AM  Result Value Ref Range   Color, UA     Clarity, UA     Glucose, UA Negative Negative   Bilirubin, UA negative    Ketones, UA negative    Spec Grav, UA 1.010 1.010 - 1.025   Blood, UA negative    pH, UA 6.0 5.0 - 8.0   Protein, UA Negative Negative   Urobilinogen, UA 0.2 0.2 or 1.0 E.U./dL   Nitrite, UA negative    Leukocytes, UA Negative Negative   Appearance     Odor    Basic Metabolic Panel (BMET)     Status: Abnormal   Collection Time: 11/28/18  2:08 PM  Result Value Ref Range   Sodium 138 135 - 145 mmol/L   Potassium 3.1 (L) 3.5 - 5.1 mmol/L   Chloride 99 98 - 111 mmol/L   CO2 28 22 - 32 mmol/L   Glucose, Bld 140 (H) 70 - 99 mg/dL   BUN 20 6 - 20 mg/dL   Creatinine, Ser 1.34 (H) 0.44 - 1.00 mg/dL   Calcium 8.6 (L) 8.9 - 10.3 mg/dL   GFR calc non Af Amer 45 (L) >60 mL/min   GFR calc Af Amer 53 (L) >60 mL/min   Anion gap 11 5 - 15    Comment: Performed at [facility], [address] [place], [place] [address]  Magnesium     Status: None   Collection Time: 11/28/18  2:08 PM  Result Value Ref Range   Magnesium 1.9 1.7 - 2.4 mg/dL    Comment: Performed at [facility], [place], [place] [address]  Aldosterone + renin activity w/ ratio     Status: Abnormal   Collection Time: 12/04/18 11:57 AM  Result Value Ref Range   PRA LC/MS/MS 23.928 (H) 0.167 - 5.380 ng/mL/hr    Comment: (NOTE) This test was developed and its performance characteristics determined by [facility]. It has not been cleared or approved by the Food and Drug Administration.    ALDO / PRA Ratio 0.6 0.0 - 30.0    Comment: (NOTE)                         Units:      ng/dL per ng/mL/hr Performed At: [facility] [place], [place] [id number] Rush Farmer MD [id number]    Aldosterone 15.5 0.0 - 30.0 ng/dL     Comment: (NOTE) This test was developed and its performance characteristics determined by [facility]. It has not been cleared or approved by the Food and Drug Administration.   Basic metabolic panel     Status: Abnormal   Collection Time: 12/04/18 11:57 AM  Result Value Ref Range   Sodium 137 135 - 145 mmol/L   Potassium 3.4 (L) 3.5 - 5.1 mmol/L   Chloride 94 (L) 98 - 111 mmol/L   CO2 29 22 - 32 mmol/L   Glucose, Bld 126 (H) 70 - 99 mg/dL   BUN 25 (H) 6 - 20 mg/dL   Creatinine, Ser 1.66 (H) 0.44 - 1.00 mg/dL   Calcium 9.3 8.9 - 10.3 mg/dL   GFR calc non Af Amer 35 (L) >60 mL/min   GFR calc Af Amer 41 (L) >60 mL/min   Anion gap 14 5 - 15    Comment: Performed at [facility], [address], [place], [place] [address]  POCT INR  Status: Abnormal   Collection Time: 12/04/18 12:14 PM  Result Value Ref Range   INR 4.4 (A) 2.0 - 3.0  POCT INR     Status: Abnormal   Collection Time: 12/19/18  8:46 AM  Result Value Ref Range   INR 6.9 (A) 2.0 - 3.0  Protime-INR     Status: Abnormal   Collection Time: 12/19/18  9:42 AM  Result Value Ref Range   Prothrombin Time 42.4 (H) 11.4 - 15.2 seconds   INR 4.4 (HH) 0.8 - 1.2    Comment: CRITICAL RESULT CALLED TO, READ BACK BY AND VERIFIED WITHGlendon Axe RN AT 8329 12/19/2018 HASSANR Performed at [facility], [address] [place], [place] [address]   CULTURE, URINE COMPREHENSIVE     Status: Abnormal   Collection Time: 12/25/18  9:57 AM   Specimen: Urine   URINE  Result Value Ref Range   Urine Culture, Comprehensive Final report (A)    Organism ID, Bacteria Escherichia coli (A)     Comment: Greater than 100,000 colony forming units per mL Cefazolin <=4 ug/mL Cefazolin with an MIC <=16 predicts susceptibility to the oral agents cefaclor, cefdinir, cefpodoxime, cefprozil, cefuroxime, cephalexin, and loracarbef when used for therapy of uncomplicated urinary tract infections due to E. coli, Klebsiella  pneumoniae, and Proteus mirabilis.    ANTIMICROBIAL SUSCEPTIBILITY Comment     Comment:       ** S = Susceptible; I = Intermediate; R = Resistant **                    P = Positive; N = Negative             MICS are expressed in micrograms per mL    Antibiotic                 RSLT#1    RSLT#2    RSLT#3    RSLT#4 Amoxicillin/Clavulanic Acid    S Ampicillin                     S Cefepime                       S Ceftriaxone                    S Cefuroxime                     S Ciprofloxacin                  S Ertapenem                      S Gentamicin                     S Imipenem                       S Levofloxacin                   S Meropenem                      S Nitrofurantoin                 S Piperacillin/Tazobactam        S Tetracycline  S Tobramycin                     S Trimethoprim/Sulfa             S   POCT Urinalysis Dipstick     Status: Abnormal   Collection Time: 12/25/18  9:58 AM  Result Value Ref Range   Color, UA     Clarity, UA     Glucose, UA Negative Negative   Bilirubin, UA negative    Ketones, UA negative    Spec Grav, UA 1.010 1.010 - 1.025   Blood, UA small    pH, UA 7.5 5.0 - 8.0   Protein, UA Negative Negative   Urobilinogen, UA 0.2 0.2 or 1.0 E.U./dL   Nitrite, UA negative    Leukocytes, UA Moderate (2+) (A) Negative   Appearance     Odor    Basic metabolic panel     Status: Abnormal   Collection Time: 12/25/18 10:33 AM  Result Value Ref Range   Glucose 103 (H) 65 - 99 mg/dL   BUN 15 6 - 24 mg/dL   Creatinine, Ser 1.34 (H) 0.57 - 1.00 mg/dL   GFR calc non Af Amer 46 (L) >59 mL/min/1.73   GFR calc Af Amer 53 (L) >59 mL/min/1.73   BUN/Creatinine Ratio 11 9 - 23   Sodium 139 134 - 144 mmol/L   Potassium 3.0 (L) 3.5 - 5.2 mmol/L   Chloride 95 (L) 96 - 106 mmol/L   CO2 27 20 - 29 mmol/L   Calcium 8.8 8.7 - 10.2 mg/dL  POCT INR     Status: Abnormal   Collection Time: 12/25/18 11:15 AM  Result Value Ref Range   INR 4.3  (A) 2.0 - 3.0  POCT INR     Status: Abnormal   Collection Time: 01/01/19  4:14 PM  Result Value Ref Range   INR 3.4 (A) 2.0 - 3.0  POCT INR     Status: Abnormal   Collection Time: 01/22/19 12:00 AM  Result Value Ref Range   INR 4.8 (A) 2.0 - 3.0    Comment: Katie called from [facility] lab w/ results  POCT INR     Status: Abnormal   Collection Time: 01/22/19 12:02 PM  Result Value Ref Range   INR 8.0 (A) 2.0 - 3.0  Protime-INR     Status: Abnormal   Collection Time: 01/22/19 12:32 PM  Result Value Ref Range   Prothrombin Time 44.8 (H) 11.4 - 15.2 seconds   INR 4.8 (HH) 0.8 - 1.2    Comment: RESULT REPEATED AND VERIFIED CRITICAL RESULT CALLED TO, READ BACK BY AND VERIFIED WITH:  MANDY MOODY AT 1332 01/22/2019 SDR (NOTE) INR goal varies based on device and disease states. Performed at [facility], [address] [place], [place] [address]   POCT INR     Status: Abnormal   Collection Time: 01/29/19 12:57 PM  Result Value Ref Range   INR 7.1 (A) 2.0 - 3.0  Novel Coronavirus, NAA ([facility])     Status: None   Collection Time: 02/01/19 12:37 PM   Specimen: Nasopharyngeal(NP) swabs in vial transport medium   NASOPHARYNGE  TESTING  Result Value Ref Range   SARS-CoV-2, NAA Not Detected Not Detected    Comment: This nucleic acid amplification test was developed and its performance characteristics determined by [employer]. Nucleic acid amplification tests include PCR and TMA. This test has not been FDA cleared or approved. This test  has been authorized by FDA under an Emergency Use Authorization (EUA). This test is only authorized for the duration of time the declaration that circumstances exist justifying the authorization of the emergency use of in vitro diagnostic tests for detection of SARS-CoV-2 virus and/or diagnosis of COVID-19 infection under section 564(b)(1) of the Act, 21 U.S.C. 638VFI-4(P) (1), unless the authorization is terminated or  revoked sooner. When diagnostic testing is negative, the possibility of a false negative result should be considered in the context of a patient's recent exposures and the presence of clinical signs and symptoms consistent with COVID-19. An individual without symptoms of COVID-19 and who is not shedding SARS-CoV-2 virus would  expect to have a negative (not detected) result in this assay.   POCT INR     Status: Abnormal   Collection Time: 02/05/19 12:46 PM  Result Value Ref Range   INR 4.2 (A) 2.0 - 3.0  POCT INR     Status: Abnormal   Collection Time: 02/19/19 12:59 PM  Result Value Ref Range   INR 1.4 (A) 2.0 - 3.0  POCT Urinalysis Dipstick     Status: Abnormal   Collection Time: 02/20/19  3:13 PM  Result Value Ref Range   Color, UA     Clarity, UA     Glucose, UA Negative Negative   Bilirubin, UA negative    Ketones, UA Negative    Spec Grav, UA 1.010 1.010 - 1.025   Blood, UA trace    pH, UA 6.5 5.0 - 8.0   Protein, UA Positive (A) Negative    Comment: trace   Urobilinogen, UA 0.2 0.2 or 1.0 E.U./dL   Nitrite, UA Negative    Leukocytes, UA Negative Negative   Appearance     Odor      .Assessment/Plan: 1. Encounter for general adult medical examination with abnormal findings Annual health maintenance exam today.   2. Renal calculus, right Recently had stent placed to help pass renal stone. U/a positive for protein and trace blood only. She is to follow up with urology as indicated.   3. Vaginal candidiasis Start diflucan 127m one time. May repeat dose in three days for persistent symptoms. Understand that diflucan can interfere with effectiveness of warfarin. Patient is followed by coumadin clinic and INRs are checked weekly and adjustments made as indicated.  - fluconazole (DIFLUCAN) 150 MG tablet; Take 1 tablet po once. May repeat dose in 3 days as needed for persistent symptoms.  Dispense: 3 tablet; Refill: 0  4. Oral candidiasis Nystatin rinse. Swish and  swallow with 569m up to four times daily for next 7 to 10 days - nystatin (MYCOSTATIN) 100000 UNIT/ML suspension; Take 5 mLs (500,000 Units total) by mouth 4 (four) times daily.  Dispense: 200 mL; Refill: 1  5. Dysuria - POCT Urinalysis Dipstick  6. Atrial fibrillation, unspecified type ([place] warfar[place] treatment. Patient is followed by coumadin clinic and INRs are checked weekly and adjustments made as indicated.   General Counseling: Savayah verbalizes understanding of the findings of todays visit and agrees with plan of treatment. I have discussed any further diagnostic evaluation that may be needed or ordered today. We also reviewed her medications today. she has been encouraged to call the office with any questions or concerns that should arise related to todays visit.    Counseling:  This patient was seen by HeLeretha PolNP Collaboration with Dr FoLavera Guises a part of collaborative care agreement  Orders Placed This Encounter  Procedures  . POCT Urinalysis Dipstick    Meds ordered this encounter  Medications  . fluconazole (DIFLUCAN) 150 MG tablet    Sig: Take 1 tablet po once. May repeat dose in 3 days as needed for persistent symptoms.    Dispense:  3 tablet    Refill:  0    Patient is followed by coumadin clinic and INRs checked weekly. Will be adjusted if diflucan effects INR.    Order Specific Question:   Supervising Provider    Answer:   Lavera Guise [[id number]]  . nystatin (MYCOSTATIN) 100000 UNIT/ML suspension    Sig: Take 5 mLs (500,000 Units total) by mouth 4 (four) times daily.    Dispense:  200 mL    Refill:  1    Order Specific Question:   Supervising Provider    Answer:   Lavera Guise [[id number]]    Total time spent: 55 Minutes  Time spent includes review of chart, medications, test results, and follow up plan with the patient.     Lavera Guise, MD  Internal Medicine

## 2019-02-21 DIAGNOSIS — B370 Candidal stomatitis: Secondary | ICD-10-CM | POA: Insufficient documentation

## 2019-02-21 DIAGNOSIS — Z0001 Encounter for general adult medical examination with abnormal findings: Secondary | ICD-10-CM | POA: Insufficient documentation

## 2019-02-23 ENCOUNTER — Encounter: Payer: Self-pay | Admitting: Nurse Practitioner

## 2019-03-05 ENCOUNTER — Ambulatory Visit (INDEPENDENT_AMBULATORY_CARE_PROVIDER_SITE_OTHER): Payer: Managed Care, Other (non HMO)

## 2019-03-05 ENCOUNTER — Other Ambulatory Visit: Payer: Self-pay

## 2019-03-05 DIAGNOSIS — I050 Rheumatic mitral stenosis: Secondary | ICD-10-CM

## 2019-03-05 DIAGNOSIS — Z5181 Encounter for therapeutic drug level monitoring: Secondary | ICD-10-CM | POA: Diagnosis not present

## 2019-03-05 DIAGNOSIS — I4891 Unspecified atrial fibrillation: Secondary | ICD-10-CM

## 2019-03-05 LAB — POCT INR: INR: 2.5 (ref 2.0–3.0)

## 2019-03-05 NOTE — Patient Instructions (Signed)
-   continue warfarin dosage of 1.25 mg (half the 2.5 mg tablet) every day EXCEPT 1 mg (half the 1 mg tablet) on MONDAYS, [place].  -  Recheck in 3 weeks.

## 2019-03-07 ENCOUNTER — Other Ambulatory Visit: Payer: Self-pay | Admitting: Cardiovascular Disease

## 2019-03-07 NOTE — Telephone Encounter (Signed)
Refill Request.  

## 2019-03-09 ENCOUNTER — Telehealth: Payer: Self-pay

## 2019-03-09 NOTE — Telephone Encounter (Signed)
SLEEP STUDY Patient home sleep study has been approved, per pt request she will wait on study until after having knee surgery. Leslie [name]

## 2019-03-13 ENCOUNTER — Telehealth: Payer: Self-pay

## 2019-03-13 NOTE — Telephone Encounter (Signed)
Returned patient call and left a message asked her to call back. Leslie [name]

## 2019-03-19 ENCOUNTER — Telehealth: Payer: Self-pay

## 2019-03-19 ENCOUNTER — Ambulatory Visit: Payer: Managed Care, Other (non HMO) | Admitting: Nurse Practitioner

## 2019-03-19 ENCOUNTER — Other Ambulatory Visit: Payer: Self-pay

## 2019-03-19 ENCOUNTER — Encounter: Payer: Self-pay | Admitting: Nurse Practitioner

## 2019-03-19 VITALS — BP 129/65 | HR 75 | Resp 16 | Ht 64.0 in | Wt 243.6 lb

## 2019-03-19 DIAGNOSIS — R319 Hematuria, unspecified: Secondary | ICD-10-CM

## 2019-03-19 DIAGNOSIS — R300 Dysuria: Secondary | ICD-10-CM

## 2019-03-19 DIAGNOSIS — M25561 Pain in right knee: Secondary | ICD-10-CM

## 2019-03-19 DIAGNOSIS — G8929 Other chronic pain: Secondary | ICD-10-CM

## 2019-03-19 DIAGNOSIS — N390 Urinary tract infection, site not specified: Secondary | ICD-10-CM

## 2019-03-19 DIAGNOSIS — I4891 Unspecified atrial fibrillation: Secondary | ICD-10-CM | POA: Diagnosis not present

## 2019-03-19 LAB — POCT URINALYSIS DIPSTICK
Bilirubin, UA: NEGATIVE
Glucose, UA: NEGATIVE
Ketones, UA: NEGATIVE
Leukocytes, UA: NEGATIVE
Nitrite, UA: NEGATIVE
Protein, UA: NEGATIVE
Spec Grav, UA: 1.01 (ref 1.010–1.025)
Urobilinogen, UA: 0.2 E.U./dL
pH, UA: 7.5 (ref 5.0–8.0)

## 2019-03-19 MED ORDER — PHENAZOPYRIDINE HCL 200 MG PO TABS: 200.0000 mg | ORAL_TABLET | Freq: Three times a day (TID) | ORAL | 0 refills | Status: DC | PRN

## 2019-03-19 MED ORDER — AMOXICILLIN-POT CLAVULANATE 875-125 MG PO TABS: 1.0000 | ORAL_TABLET | Freq: Two times a day (BID) | ORAL | 0 refills | Status: DC

## 2019-03-19 NOTE — Telephone Encounter (Signed)
Confirmed appointment on 03/19/2019 and screened for covid. klh

## 2019-03-19 NOTE — Progress Notes (Signed)
[facility] [place], [place] [address]  Internal MEDICINE  Office Visit Note  Patient Name: Leslie [name]  [id number]  [id number]  Date of Service: 03/21/2019   Pt is here for a sick visit.  Chief Complaint  Patient presents with  . Back Pain    lower back has been hurting for few weeks  . Knee Pain    pain from 1-10 is an 8 and has been going on for a few days   . Urinary Tract Infection    burning and urinary frequency      The patient states that she started having some frequency and urgency with urination a few weeks ago. Has had some burning and painful urination for the past several days. Recent history of of kidney stones with surgical removal needed. Today, she does have trace of blood in the urine and pH of 7.5. she does have some flank pain and lower back pain as well. She has follow up with her urologist later this month.  She does have moderate to severe pain in right knee. She is scheduled to have knee surgery on 04/09/2019 with Dr. Marry Guan. States that she asked Dr. Clydell Hakim PA if he could give her anything for pain. She was told if she got cortisone injection it would postpone her surgery and there was nothing else she would be able to take at this time. She needed to rest, ice, and elevate the leg when possible, and wear ace bandage or other brace to provide support to the knee.        Current Medication:  Outpatient Encounter Medications as of 03/19/2019  Medication Sig  . albuterol (VENTOLIN HFA) 108 (90 Base) MCG/ACT inhaler Inhale 2 puffs into the lungs every 4 (four) hours as needed for wheezing or shortness of breath.  . ALPRAZolam (XANAX) 0.5 MG tablet Take 1 tablet (0.5 mg total) by mouth at bedtime as needed for anxiety.  Marland Kitchen atorvastatin (LIPITOR) 80 MG tablet Take 1 tablet (80 mg total) by mouth daily at 6 PM.  . diltiazem (CARDIZEM CD) 120 MG 24 hr capsule Take 1 capsule (120 mg total) by mouth daily.  Marland Kitchen estradiol (ESTRACE) 0.5 MG  tablet Take 1 tablet (0.5 mg total) by mouth daily.  . metoprolol succinate (TOPROL-XL) 50 MG 24 hr tablet Take 1-2 tablets (50-100 mg total) by mouth as directed. Take 1 tablet (50 mg) in the Morning & 2 tablets (100 mg) at night.  . nystatin (MYCOSTATIN) 100000 UNIT/ML suspension Take 5 mLs (500,000 Units total) by mouth 4 (four) times daily.  Marland Kitchen omeprazole (PRILOSEC) 40 MG capsule Take 1 capsule (40 mg total) by mouth daily.  . potassium chloride (KLOR-CON) 10 MEQ tablet Take 5 tablets (50 mEq total) by mouth as directed. Take 3 tablets (30 mEq) in the AM and 2 tablets (20 mEq) in the PM  . torsemide (DEMADEX) 20 MG tablet Take 2 tablets (40 mg total) by mouth 2 (two) times daily.  Marland Kitchen warfarin (COUMADIN) 2 MG tablet TAKE AS DIRECTED BY COUMADIN CLINIC  . [DISCONTINUED] fluconazole (DIFLUCAN) 150 MG tablet Take 1 tablet po once. May repeat dose in 3 days as needed for persistent symptoms.  Marland Kitchen amoxicillin-clavulanate (AUGMENTIN) 875-125 MG tablet Take 1 tablet by mouth 2 (two) times daily.  . phenazopyridine (PYRIDIUM) 200 MG tablet Take 1 tablet (200 mg total) by mouth 3 (three) times daily as needed for pain.   No facility-administered encounter medications on file as of 03/19/2019.  Medical History: Past Medical History:  Diagnosis Date  . (HFpEF) heart failure with preserved ejection fraction ([place])    a. 08/2017 Echo: EF 55-60%.  Grade 2 diastolic dysfunction.  Moderate mitral stenosis.  . Allergy   . Anemia   . Anxiety   . BRCA negative 03/22/2013  . Bronchitis 02/19/2016   ON LEVAQUIN PO  . Chest tightness   . Cigarette smoker 09/11/2017   8-10 day  . GERD (gastroesophageal reflux disease)   . History of kidney stones   . Hyperlipidemia   . Hypertension   . Interstitial lung disease ([place])    a. CT 2013 b. 02/2018 CXR noted recurrent intersistial changes ILD vs chronic bronchitis  . Moderate mitral stenosis    a.  08/2017 TEE: EF 60 to 65%.  Moderate mitral stenosis.  Mean  gradient 14 mmHg.  Valve area 2.59 cm by planimetry, 2.72 cm by pressure half-time.  . Persistent atrial fibrillation ([place])    a. 08/2017 s/p TEE/DCCV; b. CHA2DS2VASc = 2-->warfarin.     Today's Vitals   03/19/19 0958  BP: 129/65  Pulse: 75  Resp: 16  SpO2: 99%  Weight: 243 lb 9.6 oz (110.5 kg)  Height: 5' 4" (1.626 m)   Body mass index is 41.81 kg/m.  Review of Systems  Constitutional: Negative for activity change, chills, fatigue and unexpected weight change.  HENT: Negative for congestion, postnasal drip, rhinorrhea, sneezing and sore throat.   Respiratory: Negative for cough, chest tightness, shortness of breath and wheezing.   Cardiovascular: Negative for chest pain and palpitations.  Gastrointestinal: Negative for abdominal pain, constipation, diarrhea, nausea and vomiting.  Endocrine: Negative for cold intolerance, heat intolerance, polydipsia and polyuria.  Genitourinary: Positive for dysuria, frequency, pelvic pain and urgency. Negative for difficulty urinating.       Intermittent incontinence due to overactive bladder.  Musculoskeletal: Positive for arthralgias and back pain. Negative for joint swelling and neck pain.       Severe right knee pain. Had three synvisc injections which did not work. Is scheduled to have knee surgery on 04/09/2019.   Skin: Negative for rash.  Allergic/Immunologic: Negative for environmental allergies.  Neurological: Negative for dizziness, tremors, numbness and headaches.  Hematological: Negative for adenopathy. Does not bruise/bleed easily.  Psychiatric/Behavioral: Negative for behavioral problems (Depression), sleep disturbance and suicidal ideas. The patient is nervous/anxious.     Physical Exam Vitals and nursing note reviewed.  Constitutional:      General: She is not in acute distress.    Appearance: Normal appearance. She is well-developed. She is not diaphoretic.  HENT:     Head: Normocephalic and atraumatic.     Mouth/Throat:      Pharynx: No oropharyngeal exudate.  Eyes:     Pupils: Pupils are equal, round, and reactive to light.  Neck:     Thyroid: No thyromegaly.     Vascular: No JVD.     Trachea: No tracheal deviation.  Cardiovascular:     Rate and Rhythm: Normal rate and regular rhythm.     Heart sounds: Normal heart sounds. No murmur. No friction rub. No gallop.   Pulmonary:     Effort: Pulmonary effort is normal. No respiratory distress.     Breath sounds: Normal breath sounds. No wheezing or rales.  Chest:     Chest wall: No tenderness.  Abdominal:     Palpations: Abdomen is soft.  Genitourinary:    Comments: Urine sample positive for trace blood and pH 7.5.  Musculoskeletal:  General: Normal range of motion.     Cervical back: Normal range of motion and neck supple.     Comments: There is moderate to severe pain in the right knee. There is swelling present. She is walking with significant limp favoring the right side.   Lymphadenopathy:     Cervical: No cervical adenopathy.  Skin:    General: Skin is warm and dry.  Neurological:     Mental Status: She is alert and oriented to person, place, and time.     Cranial Nerves: No cranial nerve deficit.  Psychiatric:        Behavior: Behavior normal.        Thought Content: Thought content normal.        Judgment: Judgment normal.    Assessment/Plan:  1. Urinary tract infection with hematuria, site unspecified Mild infection present. Start augmentin 862m twice daily for 7 days. Send urine for culture and sensitivity and adjust antibiotics as indicated.  - amoxicillin-clavulanate (AUGMENTIN) 875-125 MG tablet; Take 1 tablet by mouth 2 (two) times daily.  Dispense: 14 tablet; Refill: 0  2. Dysuria ,ay take pyriium 2087mup to three times daily if needed for bladder pain/spams.  - POCT Urinalysis Dipstick - phenazopyridine (PYRIDIUM) 200 MG tablet; Take 1 tablet (200 mg total) by mouth 3 (three) times daily as needed for pain.  Dispense:  10 tablet; Refill: 0 - CULTURE, URINE COMPREHENSIVE  3. Chronic pain of right knee Advised rest, ice, compression and elevation of the right knee when possible. She should conitnue regular visits with orthopedics. As of now, knee surgery scheduled for later this month.   4. Atrial fibrillation, unspecified type [facility]   General Counseling: Shellsea verbalizes understanding of the findings of todays visit and agrees with plan of treatment. I have discussed any further diagnostic evaluation that may be needed or ordered today. We also reviewed her medications today. she has been encouraged to call the office with any questions or concerns that should arise related to todays visit.    Counseling:  This patient was seen by HeLeretha PolNP Collaboration with Dr FoLavera Guises a part of collaborative care agreement  Orders Placed This Encounter  Procedures  . CULTURE, URINE COMPREHENSIVE  . POCT Urinalysis Dipstick    Meds ordered this encounter  Medications  . amoxicillin-clavulanate (AUGMENTIN) 875-125 MG tablet    Sig: Take 1 tablet by mouth 2 (two) times daily.    Dispense:  14 tablet    Refill:  0    Patient has INR levels checked once weekly. Will adjust warfarin dosing as indicated    Order Specific Question:   Supervising Provider    Answer:   KHLavera Guise1[[id number]]. phenazopyridine (PYRIDIUM) 200 MG tablet    Sig: Take 1 tablet (200 mg total) by mouth 3 (three) times daily as needed for pain.    Dispense:  10 tablet    Refill:  0    Order Specific Question:   Supervising Provider    Answer:   KHLavera Guise1[[id number]]  Time spent: 30 Minutes

## 2019-03-21 ENCOUNTER — Other Ambulatory Visit: Payer: Self-pay

## 2019-03-21 DIAGNOSIS — B3731 Acute candidiasis of vulva and vagina: Secondary | ICD-10-CM

## 2019-03-21 DIAGNOSIS — B373 Candidiasis of vulva and vagina: Secondary | ICD-10-CM

## 2019-03-21 MED ORDER — FLUCONAZOLE 150 MG PO TABS: ORAL_TABLET | ORAL | 0 refills | Status: DC

## 2019-03-21 NOTE — Progress Notes (Signed)
Started on augmentin at her visit.

## 2019-03-23 LAB — CULTURE, URINE COMPREHENSIVE

## 2019-03-25 NOTE — Discharge Instructions (Signed)
Instructions after Total Knee Replacement   Leslie [name], Jr., M.D.     Dept. of Orthopaedics & Sports Medicine  [facility]  [address]  [place], [place]  [address]  Phone: [phone]   Fax: [phone]    DIET: Drink plenty of non-alcoholic fluids. Resume your normal diet. Include foods high in fiber.  ACTIVITY:  You may use crutches or a walker with weight-bearing as tolerated, unless instructed otherwise. You may be weaned off of the walker or crutches by your Physical Therapist.  Do NOT place pillows under the knee. Anything placed under the knee could limit your ability to straighten the knee.   Continue doing gentle exercises. Exercising will reduce the pain and swelling, increase motion, and prevent muscle weakness.   Please continue to use the TED compression stockings for 6 weeks. You may remove the stockings at night, but should reapply them in the morning. Do not drive or operate any equipment until instructed.  WOUND CARE:  Continue to use the PolarCare or ice packs periodically to reduce pain and swelling. You may bathe or shower after the staples are removed at the first office visit following surgery.  MEDICATIONS: You may resume your regular medications. Please take the pain medication as prescribed on the medication. Do not take pain medication on an empty stomach. You have been given a prescription for a blood thinner (Lovenox or Coumadin). Please take the medication as instructed. (NOTE: After completing a 2 week course of Lovenox, take one Enteric-coated aspirin once a day. This along with elevation will help reduce the possibility of phlebitis in your operated leg.) Do not drive or drink alcoholic beverages when taking pain medications.  CALL THE OFFICE FOR: Temperature above 101 degrees Excessive bleeding or drainage on the dressing. Excessive swelling, coldness, or paleness of the toes. Persistent nausea and vomiting.  FOLLOW-UP:  You  should have an appointment to return to the office in 10-14 days after surgery. Arrangements have been made for continuation of Physical Therapy (either home therapy or outpatient therapy).   [facility] Department Directory         [email or web address]       [email or web address]          Cardiology  Appointments: [place] - [phone] [place] - [phone]  Endocrinology  Appointments: [place] - [phone] [place] - [phone]  Gastroenterology  Appointments: [place] - [phone] [place] - [phone]        General Surgery   Appointments: [place] - [phone]  Internal Medicine/Family Medicine  Appointments: [place] - [phone] [place] - [phone] [place] - [phone]  Metabolic and Weigh Loss Surgery  Appointments: [place] - [phone]        Neurology  Appointments: [place] - [phone] [place] - [phone]  Neurosurgery  Appointments: [place] - [phone]  Obstetrics & Gynecology  Appointments: [place] - [phone] [place] - [phone]        Pediatrics  Appointments: [place] - [phone] [place] - [phone]  Physiatry  Appointments: [place] -[phone]  Physical Therapy  Appointments: [place] - [phone] [place] - [phone]        Podiatry  Appointments: [place] - [phone] [place] - [phone]  Pulmonology  Appointments: [place] - [phone]  Rheumatology  Appointments: [place] - [phone]        [place] Location: [facility]  [address] [place], [place]  [address]  [place] Location: [facility] [address] [place], [place]  [address]  [place] Location: [facility] [address] [place], [place]  [address]    

## 2019-03-29 ENCOUNTER — Encounter
Admission: RE | Admit: 2019-03-29 | Discharge: 2019-03-29 | Disposition: A | Discharge status: Home / Self Care | Payer: Managed Care, Other (non HMO) | Source: Ambulatory Visit | Attending: Orthopedic Surgery | Admitting: Orthopedic Surgery

## 2019-03-29 ENCOUNTER — Other Ambulatory Visit: Payer: Self-pay

## 2019-03-29 DIAGNOSIS — I440 Atrioventricular block, first degree: Secondary | ICD-10-CM | POA: Diagnosis not present

## 2019-03-29 DIAGNOSIS — Z01818 Encounter for other preprocedural examination: Secondary | ICD-10-CM | POA: Diagnosis present

## 2019-03-29 HISTORY — DX: Dyspnea, unspecified: R06.00

## 2019-03-29 NOTE — Patient Instructions (Signed)
Your procedure is scheduled on: Mon 3/22 Report to Day Surgery.Medical Leslie [name] To find out your arrival time please call [phone] between 1PM - 3PM on Friday 3/19  Remember: Instructions that are not followed completely may result in serious medical risk,  up to and including death, or upon the discretion of your surgeon and anesthesiologist your  surgery may need to be rescheduled.     _X__ 1. Do not eat food after midnight the night before your procedure.                 No gum chewing or hard candies. You may drink clear liquids up to 2 hours                 before you are scheduled to arrive for your surgery- DO not drink clear                 liquids within 2 hours of the start of your surgery.                 Clear Liquids include:  water, apple juice without pulp, clear Gatorade, G2 or                  Gatorade Zero (avoid Red/Purple/Blue), Black Coffee or Tea (Do not add                 anything to coffee or tea). __x___2.   Complete the carbohydrate drink provided to you, 2 hours before arrival.  __X__2.  On the morning of surgery brush your teeth with toothpaste and water, you                may rinse your mouth with mouthwash if you wish.  Do not swallow any toothpaste of mouthwash.     _X__ 3.  No Alcohol for 24 hours before or after surgery.   ___ 4.  Do Not Smoke or use e-cigarettes For 24 Hours Prior to Your Surgery.                 Do not use any chewable tobacco products for at least 6 hours prior to                 surgery.  ____  5.  Bring all medications with you on the day of surgery if instructed.   __x__  6.  Notify your doctor if there is any change in your medical condition      (cold, fever, infections).     Do not wear jewelry, make-up, hairpins, clips or nail polish. Do not wear lotions, powders, or perfumes. You may wear deodorant. Do not shave 48 hours prior to surgery. Men may shave face and neck. Do not bring valuables to the  hospital.    [name] is not responsible for any belongings or valuables.  Contacts, dentures or bridgework may not be worn into surgery. Leave your suitcase in the car. After surgery it may be brought to your room. For patients admitted to the hospital, discharge time is determined by your treatment team.   Patients discharged the day of surgery will not be allowed to drive home.   Make arrangements for someone to be with you for the first 24 hours of your Same Day Discharge.    Please read over the following fact sheets that you were given:    __x__ Take these medicines the morning of surgery with A SIP OF WATER:  1. diltiazem (CARDIZEM CD) 120 MG 24 hr capsule  2. metoprolol succinate (TOPROL-XL) 50 MG 24 hr tablet  3. omeprazole (PRILOSEC) 40 MG capsule  Dose the night before and one the morning of surgery  4.phenazopyridine (PYRIDIUM) 200 MG tablet if needed  5.  6.  ____ Fleet Enema (as directed)   __x__ Use CHG Soap (or wipes) as directed  ____ Use Benzoyl Peroxide Gel as instructed  _x___ Use inhalers on the day of surgery albuterol (VENTOLIN HFA) 108 (90 Base) MCG/ACT inhaler and bring with you  ____ Stop metformin 2 days prior to surgery    ____ Take 1/2 of usual insulin dose the night before surgery. No insulin the morning          of surgery.   __x__ Stop Coumadin   Last dose on tues. 3/16   __x__ Stop Anti-inflammatories no ibuprofen aleve or aspirin after 3/15   ____ Stop supplements until after surgery.    ____ Bring C-Pap to the hospital.

## 2019-03-30 ENCOUNTER — Encounter
Admission: RE | Admit: 2019-03-30 | Discharge: 2019-03-30 | Disposition: A | Discharge status: Home / Self Care | Payer: Managed Care, Other (non HMO) | Source: Ambulatory Visit | Attending: Orthopedic Surgery | Admitting: Orthopedic Surgery

## 2019-03-30 DIAGNOSIS — Z01818 Encounter for other preprocedural examination: Secondary | ICD-10-CM | POA: Diagnosis not present

## 2019-03-30 LAB — COMPREHENSIVE METABOLIC PANEL
ALT: 22 U/L (ref 0–44)
AST: 29 U/L (ref 15–41)
Albumin: 4.3 g/dL (ref 3.5–5.0)
Alkaline Phosphatase: 119 U/L (ref 38–126)
Anion gap: 10 (ref 5–15)
BUN: 18 mg/dL (ref 6–20)
CO2: 28 mmol/L (ref 22–32)
Calcium: 8.5 mg/dL — ABNORMAL LOW (ref 8.9–10.3)
Chloride: 98 mmol/L (ref 98–111)
Creatinine, Ser: 1.33 mg/dL — ABNORMAL HIGH (ref 0.44–1.00)
GFR calc Af Amer: 53 mL/min — ABNORMAL LOW (ref >60)
GFR calc non Af Amer: 46 mL/min — ABNORMAL LOW (ref >60)
Glucose, Bld: 98 mg/dL (ref 70–99)
Potassium: 2.9 mmol/L — ABNORMAL LOW (ref 3.5–5.1)
Sodium: 136 mmol/L (ref 135–145)
Total Bilirubin: 0.7 mg/dL (ref 0.3–1.2)
Total Protein: 8.4 g/dL — ABNORMAL HIGH (ref 6.5–8.1)

## 2019-03-30 LAB — CBC
HCT: 34.3 % — ABNORMAL LOW (ref 36.0–46.0)
Hemoglobin: 11.6 g/dL — ABNORMAL LOW (ref 12.0–15.0)
MCH: 29.8 pg (ref 26.0–34.0)
MCHC: 33.8 g/dL (ref 30.0–36.0)
MCV: 88.2 fL (ref 80.0–100.0)
Platelets: 391 10*3/uL (ref 150–400)
RBC: 3.89 MIL/uL (ref 3.87–5.11)
RDW: 12.4 % (ref 11.5–15.5)
WBC: 6.1 10*3/uL (ref 4.0–10.5)
nRBC: 0 % (ref 0.0–0.2)

## 2019-03-30 LAB — TYPE AND SCREEN
ABO/RH(D): A POS
Antibody Screen: NEGATIVE

## 2019-03-30 LAB — SURGICAL PCR SCREEN
MRSA, PCR: NEGATIVE
Staphylococcus aureus: NEGATIVE

## 2019-03-30 LAB — URINALYSIS, ROUTINE W REFLEX MICROSCOPIC
Bilirubin Urine: NEGATIVE
Glucose, UA: NEGATIVE mg/dL
Hgb urine dipstick: NEGATIVE
Ketones, ur: NEGATIVE mg/dL
Leukocytes,Ua: NEGATIVE
Nitrite: NEGATIVE
Protein, ur: NEGATIVE mg/dL
Specific Gravity, Urine: 1.006 (ref 1.005–1.030)
pH: 7 (ref 5.0–8.0)

## 2019-03-30 LAB — C-REACTIVE PROTEIN: CRP: 0.6 mg/dL (ref <1.0)

## 2019-03-30 LAB — PROTIME-INR
INR: 2.4 — ABNORMAL HIGH (ref 0.8–1.2)
Prothrombin Time: 25.8 seconds — ABNORMAL HIGH (ref 11.4–15.2)

## 2019-03-30 LAB — SEDIMENTATION RATE: Sed Rate: 46 mm/hr — ABNORMAL HIGH (ref 0–30)

## 2019-03-30 LAB — APTT: aPTT: 35 seconds (ref 24–36)

## 2019-03-30 NOTE — Pre-Procedure Instructions (Signed)
Called and spoke with Tiffany at Dr. Maree Krabbe office and notified her that even though pt got cardiac clearance back in Jan for this surgery, her EKG is abnormal today so anesthesia is wanting EKG sent to Dr Rockey Situ for further review. Informed Tiffany that I sent this to their office along with Dr Donivan Scull office.

## 2019-03-30 NOTE — Pre-Procedure Instructions (Signed)
SECURE CHAT WITH DR Kayleen Memos:  Pt is having TKA with [facility] on 3-22. Pt with h/o a-fib and htn and sees G[facility]. Dr Rockey Situ cleared pt for this surgery back in Jan but Marthann Schiller was done today and is abnormal. Can you review todays EKG with the one done in Jan and let me know if I need to send this back to [facility] for review?   Yes, I do some differences. Can we send to [facility] for review  Yes I will send it. Thanks!

## 2019-03-30 NOTE — Telephone Encounter (Signed)
Per preadmit patient ekg abnormal please re advise on clearance

## 2019-04-01 LAB — URINE CULTURE
Culture: <10000 — AB
Special Requests: NORMAL

## 2019-04-02 ENCOUNTER — Other Ambulatory Visit: Payer: Self-pay

## 2019-04-02 ENCOUNTER — Ambulatory Visit (INDEPENDENT_AMBULATORY_CARE_PROVIDER_SITE_OTHER): Payer: Managed Care, Other (non HMO)

## 2019-04-02 DIAGNOSIS — I4891 Unspecified atrial fibrillation: Secondary | ICD-10-CM | POA: Diagnosis not present

## 2019-04-02 DIAGNOSIS — Z5181 Encounter for therapeutic drug level monitoring: Secondary | ICD-10-CM

## 2019-04-02 DIAGNOSIS — I050 Rheumatic mitral stenosis: Secondary | ICD-10-CM | POA: Diagnosis not present

## 2019-04-02 LAB — POCT INR: INR: 3.9 — AB (ref 2.0–3.0)

## 2019-04-02 NOTE — Telephone Encounter (Signed)
Left a detailed message on patients voicemail letting her know that her EKG was abnormal but this is likely due to lead placement and that we have scheduled for her to have another EKG on 04/05/19 at 0915 am. I let her know to come [facility] medical mall for this E[facility] and to please call back with any questions or concerns.

## 2019-04-02 NOTE — Telephone Encounter (Signed)
Spoke with patient regarding repeat EKG and Labs on 04/05/19. She agrees to this plan of care and verbalizes understanding.    Encouraged patient to call back with any questions or concerns.

## 2019-04-02 NOTE — Telephone Encounter (Addendum)
Per Daune Perch, "Dr. Rockey Situ felt that EKG is abnormal due to poor lead placement".   Covering staff, Please schedule patient for EKG at [facility] office and have reviewed  By Dr. Rockey Situ.

## 2019-04-02 NOTE — Telephone Encounter (Signed)
Sent message to [place] office to set pt up for EKG for pre op. Pt is coming in their office today for CVRR appt . Hopefully we can set up EKG to be done today as well for pre op clearance.

## 2019-04-02 NOTE — Telephone Encounter (Signed)
I will forward notes to pre op team as pt is scheduled to have EKG for pre op clearance .

## 2019-04-02 NOTE — Patient Instructions (Signed)
-   skip warfarin tonight & have a serving of greens - continue warfarin dosage of 1.25 mg (half the 2.5 mg tablet) every day EXCEPT 1 mg (half the 1 mg tablet) on MONDAYS, Leslie [name].  -  Hold wafarin Friday, Saturday & Sunday - On Monday evening, if cleared by the MD restart your warfarin w/ extra 1/2 tablet, then resume previous dosage -Recheck INR on 4/12

## 2019-04-02 NOTE — Telephone Encounter (Signed)
EKG is likely abnormal due to lead placement. Dr. Granville Lewis office will work on getting pt into [place] office for better EKG. If then normal, can clear pt.

## 2019-04-02 NOTE — Addendum Note (Signed)
Addended by: Resa Miner I on: 04/02/2019 09:26 AM   Modules accepted: Orders

## 2019-04-05 ENCOUNTER — Ambulatory Visit
Admission: RE | Admit: 2019-04-05 | Discharge: 2019-04-05 | Disposition: A | Discharge status: Home / Self Care | Payer: Managed Care, Other (non HMO) | Source: Ambulatory Visit | Attending: Orthopedic Surgery | Admitting: Orthopedic Surgery

## 2019-04-05 ENCOUNTER — Encounter
Admission: RE | Admit: 2019-04-05 | Discharge: 2019-04-05 | Disposition: A | Discharge status: Home / Self Care | Payer: Managed Care, Other (non HMO) | Source: Ambulatory Visit | Attending: Orthopedic Surgery | Admitting: Orthopedic Surgery

## 2019-04-05 ENCOUNTER — Telehealth: Payer: Self-pay | Admitting: *Deleted

## 2019-04-05 ENCOUNTER — Other Ambulatory Visit: Payer: Self-pay

## 2019-04-05 DIAGNOSIS — Z01812 Encounter for preprocedural laboratory examination: Secondary | ICD-10-CM | POA: Insufficient documentation

## 2019-04-05 DIAGNOSIS — Z20822 Contact with and (suspected) exposure to covid-19: Secondary | ICD-10-CM | POA: Diagnosis not present

## 2019-04-05 DIAGNOSIS — I480 Paroxysmal atrial fibrillation: Secondary | ICD-10-CM | POA: Diagnosis not present

## 2019-04-05 LAB — SARS CORONAVIRUS 2 (TAT 6-24 HRS): SARS Coronavirus 2: NEGATIVE

## 2019-04-05 NOTE — Telephone Encounter (Signed)
Pt had EKG done today for pre op clearance. I will send note to pre op team let them know EKG has been done and is in Epic.

## 2019-04-05 NOTE — Telephone Encounter (Signed)
Personally reviewed EKG. Normal sinus rhythm without acute findings. Patient is cleared for surgery at acceptable risk.    I will route this recommendation to the requesting party via Epic fax function and remove from pre-op pool.  Please call with questions.  [place], [place] 04/05/2019, 12:30 PM

## 2019-04-05 NOTE — Telephone Encounter (Signed)
Patient cleared by Dr Rockey Situ in January.  Dr Marry Guan did another EKG which was normal from rescheduling. We did another EKG for Dr Rockey Situ to review for clearance again. Nira Conn said she would fax a clearance form for Dr Rockey Situ to complete by tomorrow in order for patient to be ready for her surgery by Monday 04/09/19. Awaiting form.

## 2019-04-05 NOTE — Pre-Procedure Instructions (Signed)
Called and spoke with Anderson Malta, Dr Gwenyth Ober nurse, regarding clearance. Pt was called back in to Dr Gwenyth Ober office today for repeat since EKG in PAT was abnormal. Anderson Malta said she will get Dr Rockey Situ to look at EKG and sign off on clearance note. I refaxed clearance note to Dr Marisa Sprinkles office

## 2019-04-05 NOTE — Telephone Encounter (Signed)
° °  [facility] Pre-operative Risk Assessment    Request for surgical clearance:  1. What type of surgery is being performed? R TKA  2. When is this surgery scheduled? 04/09/19  3. What type of clearance is required (medical clearance vs. Pharmacy clearance to hold med vs. Both)? both  4. Are there any medications that need to be held prior to surgery and how long? Not noted  5. Practice name and name of physician performing surgery? [facility]  6. What is your office phone number  [phone]   7.   What is your office fax number [phone]  8.   Anesthesia type (None, local, MAC, general) ? Not noted  Preadmit requesting additional clearance per Abnormal EKG   Marykay Lex 04/05/2019, 4:46 PM  _________________________________________________________________   (provider comments below)

## 2019-04-06 NOTE — Telephone Encounter (Signed)
Note from provider faxed to pre admit.   No further needs at this time.

## 2019-04-06 NOTE — Telephone Encounter (Signed)
Report to preadmit concerned for EKG changes since original surgical clearance in Jan.   I spoke in person to Owens Shark, NP who agrees to address it today. I will let preadmit know we are working on it.

## 2019-04-06 NOTE — Telephone Encounter (Signed)
   Primary Cardiologist: Ida Rogue, MD  Chart reviewed as part of pre-operative protocol coverage. Patient was contacted 04/06/2019 in reference to pre-operative risk assessment for pending surgery as outlined below.  Leslie [name] was last seen on 01/23/19 by Dr. Rockey Situ.  Since that day, Leslie [name] has done well. She reports no chest pain, pressure, tightness, lightheadedness, dizziness.  In the office with Dr. Rockey Situ 01/23/19 EKG showed NSR HR 77bpm with PR 194. She had an EKG 03/30/19 showing NSR 63 bpm with PR 164. Repeat EKG 04/05/19 showed NSR 60 bpm with 1st degree AV block (PR 212). She is on Cardizem and Metoprolol for history of atrial fibrillation. As such, would continue both these medications at present doses. This extended PR is asymptomatic and not of concern in the setting of her medical history.   Therefore, based on ACC/AHA guidelines, the patient would be at acceptable risk for the planned procedure without further cardiovascular testing.   As per Dr. Donivan Scull previous recommendations, would minimize IV fluids perioperatively and postoperatively given history of diastolic heart failure.   Please call with questions.  Loel Dubonnet, NP 04/06/2019, 11:06 AM

## 2019-04-06 NOTE — Telephone Encounter (Signed)
Preadmit calling to check status please call

## 2019-04-08 ENCOUNTER — Encounter: Payer: Self-pay | Admitting: Orthopedic Surgery

## 2019-04-08 NOTE — H&P (Signed)
ORTHOPAEDIC HISTORY & PHYSICAL Progress Notes by Gwenlyn Fudge, PA at 03/30/2019 3:15 PM  [place] [facility] Chief Complaint:       Chief Complaint  Patient presents with  . Follow-up    H&P, RIGHT TKA ON 04/09/19--[facility]    History of Present Illness:    Leslie [name] is a 54 y.o. female that presents to clinic today for her preoperative history and evaluation.  Patient presents with her husband. The patient is scheduled to undergo a right total knee arthroplasty on 04/09/19 by Dr. Marry Guan.  Patient has a long history of right knee pain.  The pain is located primarily along the medial aspect of the knee.  She describes her pain as worse with weightbearing.  She reports associated giving way of the knee.  She denies associated numbness or tingling, locking of the knee.   The patient's symptoms have progressed to the point that they decrease her quality of life. The patient has previously undergone conservative treatment including NSAIDS and injections to the knee without adequate control of her symptoms.  Of note, patient denies any history of blood clots.  Patient is seen by Dr. Penni Homans for cardiology and is on warfarin.  Patient states she previously received clearance for her surgery that was originally scheduled for February 3.  Patient is status post left total knee arthroplasty on 12/25/14 by Dr. Mack Guise.  Past Medical, Surgical, Family, Social History, Allergies, Medications:   Past Medical History:      Past Medical History:  Diagnosis Date  . Anxiety   . Atrial fibrillation (CMS-HCC) 11/2017   med management  . Environmental allergies   . GERD (gastroesophageal reflux disease)   . Hyperlipidemia   . Hypertension   . Nephrolithiasis   . Sepsis (CMS-HCC)    Hospitalized for sepsis     Past Surgical History:       Past Surgical History:  Procedure Laterality Date  . Closed manipulation left knee   04/15/2015   Dr Wynetta Emery  . HYSTERECTOMY    . Laparoscopy cholecystectomy  02/27/2016   Dr Angie Fava  . Left knee arthroscopic medial partial meniscectomy; debridement of medial and suprapatellar plica; chondroplasty of the medial femoral condyle and undersurface of the patella; microfracture of the medial femoral condyle  04/11/2012   Dr Wynetta Emery  . Left total knee arthroplasty  12/25/2014   Dr Wynetta Emery  . Right stereotactic breast biopsy  02/03/2015   Dr Raylene Everts    Current Medications:  Current Medications        Current Outpatient Medications  Medication Sig Dispense Refill  . albuterol 90 mcg/actuation inhaler Inhale into the lungs    . ALPRAZolam (XANAX) 0.5 MG tablet 0.5 mg once daily as needed      . atorvastatin (LIPITOR) 20 MG tablet Take 20 mg by mouth once daily.    Marland Kitchen diltiazem (CARDIZEM CD) 120 MG XR capsule     . estradiol (ESTRACE) 0.5 MG tablet 0.5 mg once daily      . fluticasone propionate (FLONASE) 50 mcg/actuation nasal spray fluticasone propionate 50 mcg/actuation nasal spray,suspension    . metoprolol succinate (TOPROL-XL) 50 MG XL tablet 50 mg once daily      . omeprazole (PRILOSEC) 40 MG DR capsule 40 mg once daily      . potassium chloride (KLOR-CON) 10 mEq ER tablet 10 mEq once daily      . potassium chloride (KLOR-CON) 20 MEQ ER  tablet Take 1 tablet (20 mEq total) by mouth 2 (two) times daily for 18 doses 18 tablet 0  . TORsemide (DEMADEX) 20 MG tablet Take 2 tablets (40 mg) by mouth in the morning and take 1 tablet (20 mg) by mouth at 2 pm.    . warfarin (COUMADIN) 2 MG tablet      No current facility-administered medications for this visit.       Allergies:      Allergies  Allergen Reactions  . Morphine Hives, Other (See Comments) and Rash  . Oxycodone Itching, Rash, Hives and Other (See Comments)  . Hydromorphone Hcl Itching    Social History:  Social History  Social History         Socioeconomic History  . Marital status: Married    Spouse name: Juanda Crumble  . Number of children: 3  . Years of education: 24  . Highest education level: Not on file  Occupational History  . 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  Social Needs  . Financial resource strain: Not on file  . Food insecurity    Worry: Not on file    Inability: Not on file  . Transportation needs    Medical: Not on file    Non-medical: Not on file  Tobacco Use  . Smoking status: Former Smoker    Packs/day: 0.50    Years: 10.00    Pack years: 5.00    Types: Cigarettes    Quit date: 02/24/2018    Years since quitting: 1.0  . Smokeless tobacco: Never Used  Substance and Sexual Activity  . Alcohol use: Not Currently  . Drug use: Never  . Sexual activity: Yes    Partners: Male  Lifestyle  . Physical activity    Days per week: Not on file    Minutes per session: Not on file  . Stress: Not on file  Relationships  . Social Herbalist on phone: Not on file    Gets together: Not on file    Attends religious service: Not on file    Active member of club or organization: Not on file    Attends meetings of clubs or organizations: Not on file    Relationship status: Not on file  Other Topics Concern  . Not on file  Social History Narrative  . Not on file      Family History:       Family History  Problem Relation Age of Onset  . High blood pressure (Hypertension) Mother   . Breast cancer Mother     Review of Systems:   A 10+ ROS was performed, reviewed, and the pertinent orthopaedic findings are documented in the HPI.    Physical Examination:   BP 124/70 (BP Location: Left upper arm, Patient Position: Sitting, BP Cuff Size: Large Adult)   Ht 162.6 cm (5\' 4" )   Wt (!) 109.4 kg (241 lb 3.2 oz)   BMI 41.40 kg/m   Patient is a well-developed, well-nourished female in no acute distress. Patient has normal mood and  affect. Patient is alert and oriented to person, place, and time.   HEENT: Atraumatic, normocephalic.  Pupils equal and reactive to light.  Extraocular motion intact.  Noninjected sclera.  Cardiovascular: Regular rate and rhythm, with no murmurs, rubs, or gallops.  Distal pulses palpable.  Respiratory: Lungs clear to auscultation bilaterally.   RightKnee: Soft tissue swelling:mild Effusion:minimal Erythema:none Crepitance:mild Tenderness:medial Alignment:relative varus Mediolateral laxity:medial pseudolaxity Posterior BK:[id number]  Patellar tracking:Good tracking without evidence of subluxation or tilt Atrophy:No significantatrophy.  Quadriceps tone was fair to good. Range of motion:0/0/100degrees   Sensation intact over the saphenous, lateral sural cutaneous, superficial fibular, and deep fibular nerve distributions.  Tests Performed/Reviewed:  X-rays  Anteroposterior, lateral, and sunrise views of the right knee were obtained.  Images reveal severe loss of medial joint space with subchondral sclerosis and osteophyte formation noted.  Lateral compartment appears relatively well-preserved.  Sunrise view reveals patellofemoral joint is well-preserved without osteophyte formation.  No fractures or dislocations noted.  Left knee reveals total joint arthroplasty.  Impression:     ICD-10-CM  1. Primary osteoarthritis of right knee  M17.11  2. S/P total knee arthroplasty, left  Z96.652  3. Morbid obesity (CMS-HCC)  E66.01   Plan:   The patient has end-stage degenerative changes of the left knee.  It was explained to the patient that the condition is progressive in nature.  Having failed conservative treatment, the patient has elected to proceed with a total joint  arthroplasty.  The patient will undergo a total joint arthroplasty with Dr. Marry Guan.  The risks of surgery, including blood clot and infection, were discussed with the patient.  Measures to reduce these risks, including the use of anticoagulation, perioperative antibiotics, and early ambulation were discussed.  The importance of postoperative physical therapy was discussed with the patient. The patient elects to proceed with surgery. The patient is instructed to stop Warfarin 5 days prior to surgery.  The patient is instructed to call the hospital the day before surgery to learn of the proper arrival time.    Review of the patient's preop labs reveals patient is hypokalemic.  Patient is instructed to take 20 mEq of potassium chloride twice daily until surgery.  Patient is instructed to take the new prescription that is being sent in twice daily and to discontinue use of her previous potassium prescription for now.  Body mass index is 41.40 kg/m.Obesity has been associated with hypertension, diabetes, dyslipidemia, obstructive sleep apnea, arthritis, back pain, and depression.Obesity is most commonly caused by sedentary lifestyle and increased calorie intake. The patient should increase physical activity(especially low impact exercise)and reduce dietary intake (e.g. small portion via small plate size, avoid starving yourself, avoid highly processed food).Body mass index greater than 40 has been associated with increased risk for perioperative complications including poor wound healing, surgical site infection, deep venous thrombosis, and cardiopulmonary complications.  Contact our office with any questions or concerns.  Follow up as indicated, or sooner should any new problems arise, if conditions worsen, or if they are otherwise concerned.   Gwenlyn Fudge, PA [facility] [name] [place], [place] [address] Phone: [phone]  This note was  generated in part with voice recognition software and I apologize for any typographical errors that were not detected and corrected.

## 2019-04-09 ENCOUNTER — Inpatient Hospital Stay
Admission: RE | Admit: 2019-04-09 | Discharge: 2019-04-11 | DRG: 470 | Disposition: A | Discharge status: Home / Self Care | Payer: Managed Care, Other (non HMO) | Attending: Orthopedic Surgery | Admitting: Orthopedic Surgery

## 2019-04-09 ENCOUNTER — Encounter
Admission: RE | Disposition: A | Discharge status: Home / Self Care | Payer: Self-pay | Source: Home / Self Care | Attending: Orthopedic Surgery

## 2019-04-09 ENCOUNTER — Inpatient Hospital Stay: Payer: Managed Care, Other (non HMO) | Admitting: Anesthesiology

## 2019-04-09 ENCOUNTER — Inpatient Hospital Stay: Payer: Managed Care, Other (non HMO)

## 2019-04-09 ENCOUNTER — Encounter: Payer: Self-pay | Admitting: Orthopedic Surgery

## 2019-04-09 ENCOUNTER — Other Ambulatory Visit: Payer: Self-pay

## 2019-04-09 DIAGNOSIS — Z96652 Presence of left artificial knee joint: Secondary | ICD-10-CM | POA: Diagnosis present

## 2019-04-09 DIAGNOSIS — R0602 Shortness of breath: Secondary | ICD-10-CM

## 2019-04-09 DIAGNOSIS — Z20822 Contact with and (suspected) exposure to covid-19: Secondary | ICD-10-CM | POA: Diagnosis present

## 2019-04-09 DIAGNOSIS — Z7901 Long term (current) use of anticoagulants: Secondary | ICD-10-CM

## 2019-04-09 DIAGNOSIS — M1711 Unilateral primary osteoarthritis, right knee: Principal | ICD-10-CM | POA: Diagnosis present

## 2019-04-09 DIAGNOSIS — Z6841 Body Mass Index (BMI) 40.0 and over, adult: Secondary | ICD-10-CM | POA: Diagnosis not present

## 2019-04-09 DIAGNOSIS — Z96659 Presence of unspecified artificial knee joint: Secondary | ICD-10-CM

## 2019-04-09 DIAGNOSIS — Z79899 Other long term (current) drug therapy: Secondary | ICD-10-CM

## 2019-04-09 DIAGNOSIS — I482 Chronic atrial fibrillation, unspecified: Secondary | ICD-10-CM | POA: Diagnosis present

## 2019-04-09 DIAGNOSIS — I110 Hypertensive heart disease with heart failure: Secondary | ICD-10-CM | POA: Diagnosis present

## 2019-04-09 DIAGNOSIS — K219 Gastro-esophageal reflux disease without esophagitis: Secondary | ICD-10-CM | POA: Diagnosis present

## 2019-04-09 DIAGNOSIS — I5032 Chronic diastolic (congestive) heart failure: Secondary | ICD-10-CM | POA: Diagnosis present

## 2019-04-09 DIAGNOSIS — F419 Anxiety disorder, unspecified: Secondary | ICD-10-CM | POA: Diagnosis present

## 2019-04-09 DIAGNOSIS — E785 Hyperlipidemia, unspecified: Secondary | ICD-10-CM | POA: Diagnosis present

## 2019-04-09 HISTORY — PX: KNEE ARTHROPLASTY: SHX992

## 2019-04-09 LAB — POTASSIUM
Potassium, serum: 3.6
Potassium: 4.7 mmol/L (ref 3.5–5.1)

## 2019-04-09 LAB — POCT I-STAT, CHEM 8
BUN: 18 mg/dL (ref 6–20)
Calcium, Ion: 1.09 mmol/L — ABNORMAL LOW (ref 1.15–1.40)
Chloride: 99 mmol/L (ref 98–111)
Creatinine, Ser: 1.4 mg/dL — ABNORMAL HIGH (ref 0.44–1.00)
Glucose, Bld: 88 mg/dL (ref 70–99)
HCT: 33 % — ABNORMAL LOW (ref 36.0–46.0)
Hemoglobin: 11.2 g/dL — ABNORMAL LOW (ref 12.0–15.0)
Potassium: 3.6 mmol/L (ref 3.5–5.1)
Sodium: 139 mmol/L (ref 135–145)
TCO2: 29 mmol/L (ref 22–32)

## 2019-04-09 LAB — PROTIME-INR
INR: 2 — ABNORMAL HIGH (ref 0.8–1.2)
Prothrombin Time: 22.7 seconds — ABNORMAL HIGH (ref 11.4–15.2)

## 2019-04-09 SURGERY — ARTHROPLASTY, KNEE, TOTAL, USING IMAGELESS COMPUTER-ASSISTED NAVIGATION
Anesthesia: General | Site: Knee | Laterality: Right

## 2019-04-09 MED ORDER — GLYCOPYRROLATE 0.2 MG/ML IJ SOLN
INTRAMUSCULAR | Status: AC
  Filled 2019-04-09: qty 1

## 2019-04-09 MED ORDER — SUGAMMADEX SODIUM 200 MG/2ML IV SOLN
INTRAVENOUS | Status: DC | PRN
  Administered 2019-04-09: 200 mg via INTRAVENOUS

## 2019-04-09 MED ORDER — ALPRAZOLAM 0.5 MG PO TABS: 0.5000 mg | ORAL_TABLET | Freq: Every evening | ORAL | Status: DC | PRN

## 2019-04-09 MED ORDER — CEFAZOLIN SODIUM-DEXTROSE 2-4 GM/100ML-% IV SOLN
2.0000 g | INTRAVENOUS | Status: AC
  Administered 2019-04-09: 2 g via INTRAVENOUS

## 2019-04-09 MED ORDER — DEXAMETHASONE SODIUM PHOSPHATE 10 MG/ML IJ SOLN
INTRAMUSCULAR | Status: AC
  Filled 2019-04-09: qty 1

## 2019-04-09 MED ORDER — ROCURONIUM BROMIDE 100 MG/10ML IV SOLN
INTRAVENOUS | Status: DC | PRN
  Administered 2019-04-09: 5 mg via INTRAVENOUS
  Administered 2019-04-09: 10 mg via INTRAVENOUS
  Administered 2019-04-09: 45 mg via INTRAVENOUS

## 2019-04-09 MED ORDER — ROCURONIUM BROMIDE 10 MG/ML (PF) SYRINGE
PREFILLED_SYRINGE | INTRAVENOUS | Status: AC
  Filled 2019-04-09: qty 10

## 2019-04-09 MED ORDER — PHENYLEPHRINE HCL (PRESSORS) 10 MG/ML IV SOLN
INTRAVENOUS | Status: DC | PRN
  Administered 2019-04-09: 100 ug via INTRAVENOUS
  Administered 2019-04-09: 200 ug via INTRAVENOUS
  Administered 2019-04-09: 100 ug via INTRAVENOUS

## 2019-04-09 MED ORDER — DEXAMETHASONE SODIUM PHOSPHATE 10 MG/ML IJ SOLN
INTRAMUSCULAR | Status: AC
  Administered 2019-04-09: 8 mg via INTRAVENOUS
  Filled 2019-04-09: qty 1

## 2019-04-09 MED ORDER — PHENAZOPYRIDINE HCL 200 MG PO TABS
200.0000 mg | ORAL_TABLET | Freq: Three times a day (TID) | ORAL | Status: DC | PRN
  Filled 2019-04-09: qty 1

## 2019-04-09 MED ORDER — GABAPENTIN 300 MG PO CAPS
300.0000 mg | ORAL_CAPSULE | Freq: Every day | ORAL | Status: DC
  Administered 2019-04-09 – 2019-04-10 (×2): 300 mg via ORAL
  Filled 2019-04-09 (×2): qty 1

## 2019-04-09 MED ORDER — SENNOSIDES-DOCUSATE SODIUM 8.6-50 MG PO TABS
1.0000 | ORAL_TABLET | Freq: Two times a day (BID) | ORAL | Status: DC
  Administered 2019-04-09 – 2019-04-11 (×4): 1 via ORAL
  Filled 2019-04-09 (×4): qty 1

## 2019-04-09 MED ORDER — WARFARIN SODIUM 2 MG PO TABS
2.0000 mg | ORAL_TABLET | Freq: Once | ORAL | Status: AC
  Administered 2019-04-09: 2 mg via ORAL
  Filled 2019-04-09: qty 1

## 2019-04-09 MED ORDER — CELECOXIB 200 MG PO CAPS: 400.0000 mg | ORAL_CAPSULE | Freq: Once | ORAL | Status: AC

## 2019-04-09 MED ORDER — DIPHENHYDRAMINE HCL 12.5 MG/5ML PO ELIX: 12.5000 mg | ORAL_SOLUTION | ORAL | Status: DC | PRN

## 2019-04-09 MED ORDER — GABAPENTIN 300 MG PO CAPS
ORAL_CAPSULE | ORAL | Status: AC
  Administered 2019-04-09: 300 mg via ORAL
  Filled 2019-04-09: qty 1

## 2019-04-09 MED ORDER — ACETAMINOPHEN 10 MG/ML IV SOLN
1000.0000 mg | Freq: Four times a day (QID) | INTRAVENOUS | Status: AC
  Administered 2019-04-09 – 2019-04-10 (×4): 1000 mg via INTRAVENOUS
  Filled 2019-04-09 (×4): qty 100

## 2019-04-09 MED ORDER — ONDANSETRON HCL 4 MG/2ML IJ SOLN
INTRAMUSCULAR | Status: DC | PRN
  Administered 2019-04-09: 4 mg via INTRAVENOUS

## 2019-04-09 MED ORDER — DILTIAZEM HCL ER COATED BEADS 120 MG PO CP24
120.0000 mg | ORAL_CAPSULE | Freq: Every day | ORAL | Status: DC
  Administered 2019-04-10 – 2019-04-11 (×2): 120 mg via ORAL
  Filled 2019-04-09 (×3): qty 1

## 2019-04-09 MED ORDER — TRANEXAMIC ACID-NACL 1000-0.7 MG/100ML-% IV SOLN
1000.0000 mg | INTRAVENOUS | Status: AC
  Administered 2019-04-09: 1000 mg via INTRAVENOUS

## 2019-04-09 MED ORDER — PHENOL 1.4 % MT LIQD
1.0000 | OROMUCOSAL | Status: DC | PRN
  Filled 2019-04-09: qty 177

## 2019-04-09 MED ORDER — LIDOCAINE HCL (PF) 2 % IJ SOLN
INTRAMUSCULAR | Status: AC
  Filled 2019-04-09: qty 5

## 2019-04-09 MED ORDER — METOCLOPRAMIDE HCL 10 MG PO TABS
10.0000 mg | ORAL_TABLET | Freq: Three times a day (TID) | ORAL | Status: DC
  Administered 2019-04-09 – 2019-04-11 (×7): 10 mg via ORAL
  Filled 2019-04-09 (×7): qty 1

## 2019-04-09 MED ORDER — GABAPENTIN 300 MG PO CAPS: 300.0000 mg | ORAL_CAPSULE | Freq: Once | ORAL | Status: AC

## 2019-04-09 MED ORDER — SUCCINYLCHOLINE CHLORIDE 200 MG/10ML IV SOSY
PREFILLED_SYRINGE | INTRAVENOUS | Status: AC
  Filled 2019-04-09: qty 10

## 2019-04-09 MED ORDER — ACETAMINOPHEN 325 MG PO TABS: 325.0000 mg | ORAL_TABLET | Freq: Four times a day (QID) | ORAL | Status: DC | PRN

## 2019-04-09 MED ORDER — MENTHOL 3 MG MT LOZG
1.0000 | LOZENGE | OROMUCOSAL | Status: DC | PRN
  Filled 2019-04-09: qty 9

## 2019-04-09 MED ORDER — PROPOFOL 10 MG/ML IV BOLUS
INTRAVENOUS | Status: DC | PRN
  Administered 2019-04-09: 200 mg via INTRAVENOUS

## 2019-04-09 MED ORDER — LIDOCAINE HCL (CARDIAC) PF 100 MG/5ML IV SOSY
PREFILLED_SYRINGE | INTRAVENOUS | Status: DC | PRN
  Administered 2019-04-09: 50 mg via INTRAVENOUS

## 2019-04-09 MED ORDER — FENTANYL CITRATE (PF) 250 MCG/5ML IJ SOLN
INTRAMUSCULAR | Status: AC
  Filled 2019-04-09: qty 5

## 2019-04-09 MED ORDER — OXYCODONE HCL 5 MG PO TABS
10.0000 mg | ORAL_TABLET | ORAL | Status: DC | PRN
  Administered 2019-04-09 – 2019-04-10 (×2): 10 mg via ORAL
  Filled 2019-04-09 (×2): qty 2

## 2019-04-09 MED ORDER — CHLORHEXIDINE GLUCONATE 4 % EX LIQD
60.0000 mL | Freq: Once | CUTANEOUS | Status: AC
  Administered 2019-04-09: 4 via TOPICAL

## 2019-04-09 MED ORDER — CELECOXIB 200 MG PO CAPS
ORAL_CAPSULE | ORAL | Status: AC
  Administered 2019-04-09: 400 mg via ORAL
  Filled 2019-04-09: qty 2

## 2019-04-09 MED ORDER — POTASSIUM CHLORIDE 20 MEQ PO PACK
20.0000 meq | PACK | Freq: Two times a day (BID) | ORAL | Status: DC
  Administered 2019-04-10 – 2019-04-11 (×3): 20 meq via ORAL
  Filled 2019-04-09 (×3): qty 1

## 2019-04-09 MED ORDER — TORSEMIDE 20 MG PO TABS
40.0000 mg | ORAL_TABLET | Freq: Two times a day (BID) | ORAL | Status: DC
  Administered 2019-04-10 – 2019-04-11 (×3): 40 mg via ORAL
  Filled 2019-04-09 (×3): qty 2

## 2019-04-09 MED ORDER — ACETAMINOPHEN 10 MG/ML IV SOLN
INTRAVENOUS | Status: AC
  Filled 2019-04-09: qty 100

## 2019-04-09 MED ORDER — ONDANSETRON HCL 4 MG/2ML IJ SOLN
INTRAMUSCULAR | Status: AC
  Filled 2019-04-09: qty 2

## 2019-04-09 MED ORDER — ACETAMINOPHEN 10 MG/ML IV SOLN
INTRAVENOUS | Status: DC | PRN
  Administered 2019-04-09: 1000 mg via INTRAVENOUS

## 2019-04-09 MED ORDER — PROPOFOL 500 MG/50ML IV EMUL
INTRAVENOUS | Status: AC
  Filled 2019-04-09: qty 50

## 2019-04-09 MED ORDER — ONDANSETRON HCL 4 MG PO TABS: 4.0000 mg | ORAL_TABLET | Freq: Four times a day (QID) | ORAL | Status: DC | PRN

## 2019-04-09 MED ORDER — TRANEXAMIC ACID-NACL 1000-0.7 MG/100ML-% IV SOLN
INTRAVENOUS | Status: AC
  Filled 2019-04-09: qty 100

## 2019-04-09 MED ORDER — CEFAZOLIN SODIUM-DEXTROSE 2-4 GM/100ML-% IV SOLN
INTRAVENOUS | Status: AC
  Filled 2019-04-09: qty 100

## 2019-04-09 MED ORDER — METOPROLOL SUCCINATE ER 50 MG PO TB24
100.0000 mg | ORAL_TABLET | Freq: Every day | ORAL | Status: DC
  Filled 2019-04-09: qty 2

## 2019-04-09 MED ORDER — OXYCODONE HCL 5 MG PO TABS
5.0000 mg | ORAL_TABLET | ORAL | Status: DC | PRN
  Administered 2019-04-10: 5 mg via ORAL
  Filled 2019-04-09: qty 1

## 2019-04-09 MED ORDER — FENTANYL CITRATE (PF) 100 MCG/2ML IJ SOLN
INTRAMUSCULAR | Status: AC
  Filled 2019-04-09: qty 2

## 2019-04-09 MED ORDER — BISACODYL 10 MG RE SUPP
10.0000 mg | Freq: Every day | RECTAL | Status: DC | PRN
  Administered 2019-04-11: 10 mg via RECTAL
  Filled 2019-04-09: qty 1

## 2019-04-09 MED ORDER — LACTATED RINGERS IV SOLN: INTRAVENOUS | Status: DC

## 2019-04-09 MED ORDER — TRANEXAMIC ACID-NACL 1000-0.7 MG/100ML-% IV SOLN
1000.0000 mg | Freq: Once | INTRAVENOUS | Status: AC
  Administered 2019-04-09: 1000 mg via INTRAVENOUS

## 2019-04-09 MED ORDER — SODIUM CHLORIDE 0.9 % IV SOLN
INTRAVENOUS | Status: DC | PRN
  Administered 2019-04-09: 60 mL

## 2019-04-09 MED ORDER — FENTANYL CITRATE (PF) 100 MCG/2ML IJ SOLN
INTRAMUSCULAR | Status: DC | PRN
  Administered 2019-04-09 (×2): 50 ug via INTRAVENOUS
  Administered 2019-04-09: 100 ug via INTRAVENOUS
  Administered 2019-04-09: 50 ug via INTRAVENOUS

## 2019-04-09 MED ORDER — PANTOPRAZOLE SODIUM 40 MG PO TBEC
40.0000 mg | DELAYED_RELEASE_TABLET | Freq: Two times a day (BID) | ORAL | Status: DC
  Administered 2019-04-09 – 2019-04-11 (×4): 40 mg via ORAL
  Filled 2019-04-09 (×4): qty 1

## 2019-04-09 MED ORDER — MIDAZOLAM HCL 2 MG/2ML IJ SOLN
INTRAMUSCULAR | Status: AC
  Filled 2019-04-09: qty 2

## 2019-04-09 MED ORDER — SUCCINYLCHOLINE CHLORIDE 20 MG/ML IJ SOLN
INTRAMUSCULAR | Status: DC | PRN
  Administered 2019-04-09: 100 mg via INTRAVENOUS

## 2019-04-09 MED ORDER — WARFARIN - PHARMACIST DOSING INPATIENT: Freq: Every day | Status: DC

## 2019-04-09 MED ORDER — TRAMADOL HCL 50 MG PO TABS
50.0000 mg | ORAL_TABLET | ORAL | Status: DC | PRN
  Administered 2019-04-10: 50 mg via ORAL
  Filled 2019-04-09: qty 1

## 2019-04-09 MED ORDER — NEOMYCIN-POLYMYXIN B GU 40-200000 IR SOLN
Status: DC | PRN
  Administered 2019-04-09: 14 mL

## 2019-04-09 MED ORDER — BUPIVACAINE HCL (PF) 0.5 % IJ SOLN
INTRAMUSCULAR | Status: AC
  Filled 2019-04-09: qty 10

## 2019-04-09 MED ORDER — KETAMINE HCL 50 MG/ML IJ SOLN
INTRAMUSCULAR | Status: AC
  Filled 2019-04-09: qty 10

## 2019-04-09 MED ORDER — METOCLOPRAMIDE HCL 10 MG PO TABS: 5.0000 mg | ORAL_TABLET | Freq: Three times a day (TID) | ORAL | Status: DC | PRN

## 2019-04-09 MED ORDER — ALBUTEROL SULFATE (2.5 MG/3ML) 0.083% IN NEBU
2.5000 mg | INHALATION_SOLUTION | RESPIRATORY_TRACT | Status: DC | PRN
  Administered 2019-04-10 – 2019-04-11 (×2): 2.5 mg via RESPIRATORY_TRACT
  Filled 2019-04-09 (×2): qty 3

## 2019-04-09 MED ORDER — BUPIVACAINE HCL (PF) 0.25 % IJ SOLN
INTRAMUSCULAR | Status: DC | PRN
  Administered 2019-04-09: 60 mL

## 2019-04-09 MED ORDER — FERROUS SULFATE 325 (65 FE) MG PO TABS
325.0000 mg | ORAL_TABLET | Freq: Two times a day (BID) | ORAL | Status: DC
  Administered 2019-04-09 – 2019-04-11 (×4): 325 mg via ORAL
  Filled 2019-04-09 (×4): qty 1

## 2019-04-09 MED ORDER — KETAMINE HCL 50 MG/ML IJ SOLN
INTRAMUSCULAR | Status: DC | PRN
  Administered 2019-04-09 (×2): 25 mg via INTRAVENOUS

## 2019-04-09 MED ORDER — MIDAZOLAM HCL 2 MG/2ML IJ SOLN
INTRAMUSCULAR | Status: DC | PRN
  Administered 2019-04-09: 2 mg via INTRAVENOUS

## 2019-04-09 MED ORDER — FENTANYL CITRATE (PF) 100 MCG/2ML IJ SOLN
25.0000 ug | INTRAMUSCULAR | Status: DC | PRN
  Administered 2019-04-09 (×4): 25 ug via INTRAVENOUS

## 2019-04-09 MED ORDER — CEFAZOLIN SODIUM-DEXTROSE 2-4 GM/100ML-% IV SOLN
2.0000 g | Freq: Four times a day (QID) | INTRAVENOUS | Status: AC
  Administered 2019-04-09 – 2019-04-10 (×4): 2 g via INTRAVENOUS
  Filled 2019-04-09 (×4): qty 100

## 2019-04-09 MED ORDER — SODIUM CHLORIDE 0.9 % IV SOLN: INTRAVENOUS | Status: DC

## 2019-04-09 MED ORDER — METOCLOPRAMIDE HCL 5 MG/ML IJ SOLN
5.0000 mg | Freq: Three times a day (TID) | INTRAMUSCULAR | Status: DC | PRN
  Administered 2019-04-09: 5 mg via INTRAVENOUS
  Filled 2019-04-09: qty 2

## 2019-04-09 MED ORDER — METOPROLOL SUCCINATE ER 50 MG PO TB24
50.0000 mg | ORAL_TABLET | Freq: Every day | ORAL | Status: DC
  Administered 2019-04-10 – 2019-04-11 (×2): 50 mg via ORAL
  Filled 2019-04-09 (×2): qty 1

## 2019-04-09 MED ORDER — ENSURE PRE-SURGERY PO LIQD
296.0000 mL | Freq: Once | ORAL | Status: AC
  Administered 2019-04-09: 296 mL via ORAL
  Filled 2019-04-09: qty 296

## 2019-04-09 MED ORDER — PROPOFOL 500 MG/50ML IV EMUL
INTRAVENOUS | Status: DC | PRN
  Administered 2019-04-09: 10 ug/kg/min via INTRAVENOUS

## 2019-04-09 MED ORDER — MAGNESIUM HYDROXIDE 400 MG/5ML PO SUSP
30.0000 mL | Freq: Every day | ORAL | Status: DC
  Administered 2019-04-09 – 2019-04-11 (×3): 30 mL via ORAL
  Filled 2019-04-09 (×3): qty 30

## 2019-04-09 MED ORDER — DEXAMETHASONE SODIUM PHOSPHATE 10 MG/ML IJ SOLN: 8.0000 mg | Freq: Once | INTRAMUSCULAR | Status: AC

## 2019-04-09 MED ORDER — GLYCOPYRROLATE 0.2 MG/ML IJ SOLN
INTRAMUSCULAR | Status: DC | PRN
  Administered 2019-04-09: .2 mg via INTRAVENOUS

## 2019-04-09 MED ORDER — HYDROMORPHONE HCL 1 MG/ML IJ SOLN
0.5000 mg | INTRAMUSCULAR | Status: DC | PRN
  Administered 2019-04-09 – 2019-04-11 (×2): 0.5 mg via INTRAVENOUS
  Filled 2019-04-09 (×2): qty 1

## 2019-04-09 MED ORDER — DEXAMETHASONE SODIUM PHOSPHATE 10 MG/ML IJ SOLN
INTRAMUSCULAR | Status: DC | PRN
  Administered 2019-04-09: 10 mg via INTRAVENOUS

## 2019-04-09 MED ORDER — FLEET ENEMA 7-19 GM/118ML RE ENEM
1.0000 | ENEMA | Freq: Once | RECTAL | Status: AC | PRN
  Administered 2019-04-11: 1 via RECTAL

## 2019-04-09 MED ORDER — ALUM & MAG HYDROXIDE-SIMETH 200-200-20 MG/5ML PO SUSP: 30.0000 mL | ORAL | Status: DC | PRN

## 2019-04-09 MED ORDER — ATORVASTATIN CALCIUM 20 MG PO TABS
80.0000 mg | ORAL_TABLET | Freq: Every day | ORAL | Status: DC
  Administered 2019-04-09 – 2019-04-10 (×2): 80 mg via ORAL
  Filled 2019-04-09 (×2): qty 4

## 2019-04-09 MED ORDER — ONDANSETRON HCL 4 MG/2ML IJ SOLN: 4.0000 mg | Freq: Four times a day (QID) | INTRAMUSCULAR | Status: DC | PRN

## 2019-04-09 MED ORDER — PROMETHAZINE HCL 25 MG/ML IJ SOLN: 6.2500 mg | INTRAMUSCULAR | Status: DC | PRN

## 2019-04-09 SURGICAL SUPPLY — 75 items
ATTUNE PSFEM RTSZ4 NARCEM KNEE (Femur) ×1 IMPLANT
ATTUNE PSRP INSR SZ4 7 KNEE (Insert) ×1 IMPLANT
BASEPLATE TIBIAL ROTATING SZ 4 (Knees) ×1 IMPLANT
BATTERY INSTRU NAVIGATION (MISCELLANEOUS) ×8 IMPLANT
BLADE SAW 70X12.5 (BLADE) ×2 IMPLANT
BLADE SAW 90X13X1.19 OSCILLAT (BLADE) ×2 IMPLANT
BLADE SAW 90X25X1.19 OSCILLAT (BLADE) ×2 IMPLANT
BONE CEMENT GENTAMICIN (Cement) ×4 IMPLANT
CANISTER SUCT 3000ML PPV (MISCELLANEOUS) ×2 IMPLANT
CEMENT BONE GENTAMICIN 40 (Cement) IMPLANT
COOLER POLAR GLACIER W/PUMP (MISCELLANEOUS) ×2 IMPLANT
COVER WAND RF STERILE (DRAPES) ×2 IMPLANT
CUFF TOURN SGL QUICK 24 (TOURNIQUET CUFF)
CUFF TOURN SGL QUICK 30 (TOURNIQUET CUFF)
CUFF TOURN SGL QUICK 34 (TOURNIQUET CUFF) ×1
CUFF TRNQT CYL 24X4X16.5-23 (TOURNIQUET CUFF) IMPLANT
CUFF TRNQT CYL 30X4X21-28X (TOURNIQUET CUFF) IMPLANT
CUFF TRNQT CYL 34X4.125X (TOURNIQUET CUFF) IMPLANT
DRAPE 3/4 80X56 (DRAPES) ×2 IMPLANT
DRSG DERMACEA 8X12 NADH (GAUZE/BANDAGES/DRESSINGS) ×2 IMPLANT
DRSG OPSITE POSTOP 4X14 (GAUZE/BANDAGES/DRESSINGS) ×2 IMPLANT
DRSG TEGADERM 4X4.75 (GAUZE/BANDAGES/DRESSINGS) ×2 IMPLANT
DURAPREP 26ML APPLICATOR (WOUND CARE) ×4 IMPLANT
ELECT REM PT RETURN 9FT ADLT (ELECTROSURGICAL) ×2
ELECTRODE REM PT RTRN 9FT ADLT (ELECTROSURGICAL) ×1 IMPLANT
EX-PIN ORTHOLOCK NAV 4X150 (PIN) ×4 IMPLANT
GLOVE BIO SURGEON STRL SZ7.5 (GLOVE) ×4 IMPLANT
GLOVE BIOGEL M STRL SZ7.5 (GLOVE) ×4 IMPLANT
GLOVE BIOGEL PI IND STRL 7.5 (GLOVE) ×1 IMPLANT
GLOVE BIOGEL PI INDICATOR 7.5 (GLOVE) ×1
GLOVE INDICATOR 8.0 STRL GRN (GLOVE) ×2 IMPLANT
GOWN STRL REUS W/ TWL LRG LVL3 (GOWN DISPOSABLE) ×2 IMPLANT
GOWN STRL REUS W/ TWL XL LVL3 (GOWN DISPOSABLE) ×1 IMPLANT
GOWN STRL REUS W/TWL LRG LVL3 (GOWN DISPOSABLE) ×2
GOWN STRL REUS W/TWL XL LVL3 (GOWN DISPOSABLE) ×1
HEMOVAC 400CC 10FR (MISCELLANEOUS) ×2 IMPLANT
HOLDER FOLEY CATH W/STRAP (MISCELLANEOUS) ×2 IMPLANT
HOOD PEEL AWAY FLYTE STAYCOOL (MISCELLANEOUS) ×4 IMPLANT
KIT TURNOVER KIT A (KITS) ×2 IMPLANT
KNIFE SCULPS 14X20 (INSTRUMENTS) ×2 IMPLANT
LABEL OR SOLS (LABEL) ×2 IMPLANT
MANIFOLD NEPTUNE II (INSTRUMENTS) ×2 IMPLANT
NDL SAFETY ECLIPSE 18X1.5 (NEEDLE) ×1 IMPLANT
NDL SPNL 20GX3.5 QUINCKE YW (NEEDLE) ×2 IMPLANT
NEEDLE HYPO 18GX1.5 SHARP (NEEDLE) ×1
NEEDLE SPNL 20GX3.5 QUINCKE YW (NEEDLE) ×4 IMPLANT
NS IRRIG 500ML POUR BTL (IV SOLUTION) ×2 IMPLANT
PACK TOTAL KNEE (MISCELLANEOUS) ×2 IMPLANT
PAD WRAPON POLAR KNEE (MISCELLANEOUS) ×1 IMPLANT
PATELLA MEDIAL ATTUN 35MM KNEE (Knees) ×1 IMPLANT
PENCIL SMOKE EVACUATOR COATED (MISCELLANEOUS) ×1 IMPLANT
PENCIL SMOKE ULTRAEVAC 22 CON (MISCELLANEOUS) ×2 IMPLANT
PIN DRILL QUICK PACK ×2 IMPLANT
PIN FIXATION 1/8DIA X 3INL (PIN) ×6 IMPLANT
PULSAVAC PLUS IRRIG FAN TIP (DISPOSABLE) ×2
SOL .9 NS 3000ML IRR  AL (IV SOLUTION) ×1
SOL .9 NS 3000ML IRR UROMATIC (IV SOLUTION) ×1 IMPLANT
SOL PREP PVP 2OZ (MISCELLANEOUS) ×2
SOLUTION PREP PVP 2OZ (MISCELLANEOUS) ×1 IMPLANT
SPONGE DRAIN TRACH 4X4 STRL 2S (GAUZE/BANDAGES/DRESSINGS) ×2 IMPLANT
STAPLER SKIN PROX 35W (STAPLE) ×2 IMPLANT
STOCKINETTE IMPERV 14X48 (MISCELLANEOUS) ×1 IMPLANT
STRAP TIBIA SHORT (MISCELLANEOUS) ×2 IMPLANT
SUCTION FRAZIER HANDLE 10FR (MISCELLANEOUS) ×1
SUCTION TUBE FRAZIER 10FR DISP (MISCELLANEOUS) ×1 IMPLANT
SUT VIC AB 0 CT1 36 (SUTURE) ×5 IMPLANT
SUT VIC AB 1 CT1 36 (SUTURE) ×4 IMPLANT
SUT VIC AB 2-0 CT2 27 (SUTURE) ×2 IMPLANT
SYR 20ML LL LF (SYRINGE) ×2 IMPLANT
SYR 30ML LL (SYRINGE) ×4 IMPLANT
TIP FAN IRRIG PULSAVAC PLUS (DISPOSABLE) ×1 IMPLANT
TOWEL OR 17X26 4PK STRL BLUE (TOWEL DISPOSABLE) ×2 IMPLANT
TOWER CARTRIDGE SMART MIX (DISPOSABLE) ×2 IMPLANT
TRAY FOLEY MTR SLVR 16FR STAT (SET/KITS/TRAYS/PACK) ×2 IMPLANT
WRAPON POLAR PAD KNEE (MISCELLANEOUS) ×2

## 2019-04-09 NOTE — Anesthesia Preprocedure Evaluation (Addendum)
Anesthesia Evaluation  Patient identified by MRN, date of birth, ID band Patient awake    Reviewed: Allergy & Precautions, H&P , NPO status , Patient's Chart, lab work & pertinent test results  Airway Mallampati: II  TM Distance: >3 FB Neck ROM: full    Dental  (+) Teeth Intact   Pulmonary shortness of breath, Patient abstained from smoking., former smoker,           Cardiovascular hypertension, + CAD and +CHF (HFpEF)  + dysrhythmias Atrial Fibrillation      Neuro/Psych PSYCHIATRIC DISORDERS Anxiety Depression negative neurological ROS     GI/Hepatic Neg liver ROS, GERD  Controlled,  Endo/Other  negative endocrine ROS  Renal/GU Renal disease ([facility])     Musculoskeletal   Abdominal   Peds  Hematology  (+) Blood dyscrasia (anticoagulated on warfarin, INR 2.0), anemia ,   Anesthesia Other Findings Past Medical History: No date: (HFpEF) heart failure with preserved ejection fraction ([place])     Comment:  a. 08/2017 Echo: EF 55-60%.  Grade 2 diastolic               dysfunction.  Moderate mitral stenosis. No date: Allergy No date: Anemia No date: Anxiety 03/22/2013: BRCA negative 02/19/2016: Bronchitis     Comment:  ON LEVAQUIN PO No date: Chest tightness 09/11/2017: Cigarette smoker     Comment:  8-10 day No date: Dyspnea No date: GERD (gastroesophageal reflux disease) No date: History of kidney stones No date: Hyperlipidemia No date: Hypertension No date: Interstitial lung disease ([place])     Comment:  a. CT 2013 b. 02/2018 CXR noted recurrent intersistial               changes ILD vs chronic bronchitis No date: Moderate mitral stenosis     Comment:  a.  08/2017 TEE: EF 60 to 65%.  Moderate mitral stenosis.              Mean gradient 14 mmHg.  Valve area 2.59 cm by               planimetry, 2.72 cm by pressure half-time. No date: Persistent atrial fibrillation ([place])     Comment:  a. 08/2017 s/p TEE/DCCV; b.  CHA2DS2VASc = 2-->warfarin.  Past Surgical History: No date: ABDOMINAL HYSTERECTOMY     Comment:  total No date: ABDOMINAL HYSTERECTOMY 2012: BREAST BIOPSY; Bilateral 08-07-12: BREAST BIOPSY; Right     Comment:  fibroadenomatous changes and columnar cells 02/03/2015: BREAST BIOPSY     Comment:  stereo byrnett 02/27/2016: CHOLECYSTECTOMY; N/A     Comment:  Procedure: LAPAROSCOPIC CHOLECYSTECTOMY;  Surgeon:               Christene Lye, MD;  Location: [facility] ORS;  Service:              General;  Laterality: N/A; 09/26/2018: COLONOSCOPY WITH PROPOFOL; N/A     Comment:  Procedure: COLONOSCOPY WITH PROPOFOL;  Surgeon: Lucilla Lame, MD;  Location: [facility] ENDOSCOPY;  Service:               Endoscopy;  Laterality: N/A; 08/18/2018: CYSTOSCOPY W/ RETROGRADES; Right     Comment:  Procedure: CYSTOSCOPY WITH RETROGRADE PYELOGRAM;                Surgeon: Billey Co, MD;  Location: [facility] ORS;  Service: Urology;  Laterality: Right; 08/18/2018: CYSTOSCOPY/URETEROSCOPY/HOLMIUM LASER/STENT PLACEMENT;  Right     Comment:  Procedure: CYSTOSCOPY/URETEROSCOPY/STENT PLACEMENT;                Surgeon: Billey Co, MD;  Location: [facility] ORS;                Service: Urology;  Laterality: Right; 09/26/2018: ESOPHAGOGASTRODUODENOSCOPY (EGD) WITH PROPOFOL; N/A     Comment:  Procedure: ESOPHAGOGASTRODUODENOSCOPY (EGD) WITH               PROPOFOL;  Surgeon: Lucilla Lame, MD;  Location: [facility]               ENDOSCOPY;  Service: Endoscopy;  Laterality: N/A; 11/03/2018: GIVENS CAPSULE STUDY; N/A     Comment:  Procedure: GIVENS CAPSULE STUDY;  Surgeon: Lucilla Lame,              MD;  Location: [facility] ENDOSCOPY;  Service: Endoscopy;                Laterality: N/A; No date: JOINT REPLACEMENT; Left     Comment:  knee 04/15/2015: KNEE CLOSED REDUCTION; Left     Comment:  Procedure: CLOSED MANIPULATION KNEE;  Surgeon: Thornton Park, MD;  Location: [facility] ORS;  Service:                Orthopedics;  Laterality: Left; No date: KNEE SURGERY 09/13/2017: TEE WITHOUT CARDIOVERSION; N/A     Comment:  Procedure: TRANSESOPHAGEAL ECHOCARDIOGRAM (TEE);                Surgeon: Nelva Bush, MD;  Location: [facility] ORS;                Service: Cardiovascular;  Laterality: N/A; 12/25/2014: TOTAL KNEE ARTHROPLASTY; Left     Comment:  Procedure: TOTAL KNEE ARTHROPLASTY;  Surgeon: Thornton Park, MD;  Location: [facility] ORS;  Service:               Orthopedics;  Laterality: Left; No date: TUBAL LIGATION     Reproductive/Obstetrics negative OB ROS                            Anesthesia Physical Anesthesia Plan  ASA: III  Anesthesia Plan: General ETT   Post-op Pain Management:    Induction:   PONV Risk Score and Plan: Ondansetron, Dexamethasone and Midazolam  Airway Management Planned: Simple Face Mask and Natural Airway  Additional Equipment:   Intra-op Plan:   Post-operative Plan:   Informed Consent: I have reviewed the patients History and Physical, chart, labs and discussed the procedure including the risks, benefits and alternatives for the proposed anesthesia with the patient or authorized representative who has indicated his/her understanding and acceptance.     Dental Advisory Given  Plan Discussed with: Anesthesiologist  Anesthesia Plan Comments: (Not a candidate for spinal due to INR 2.0 today)      Anesthesia Quick Evaluation

## 2019-04-09 NOTE — Anesthesia Procedure Notes (Signed)
Procedure Name: Intubation Performed by: Rolla Plate, CRNA Pre-anesthesia Checklist: Patient identified, Patient being monitored, Timeout performed, Emergency Drugs available and Suction available Patient Re-evaluated:Patient Re-evaluated prior to induction Oxygen Delivery Method: Circle system utilized Preoxygenation: Pre-oxygenation with 100% oxygen Induction Type: IV induction Ventilation: Mask ventilation without difficulty Laryngoscope Size: 3 and McGraph Grade View: Grade I Tube type: Oral Tube size: 7.0 mm Number of attempts: 1 Airway Equipment and Method: Stylet and Video-laryngoscopy Placement Confirmation: ETT inserted through vocal cords under direct vision,  positive ETCO2 and breath sounds checked- equal and bilateral Secured at: 21 cm Tube secured with: Tape Dental Injury: Teeth and Oropharynx as per pre-operative assessment  Difficulty Due To: Difficult Airway- due to limited oral opening, Difficult Airway- due to large tongue and Difficulty was anticipated Future Recommendations: Recommend- induction with short-acting agent, and alternative techniques readily available

## 2019-04-09 NOTE — Consult Note (Signed)
[place] for Warfarin Indication: VTE prophylaxis  Allergies  Allergen Reactions  . Morphine Hives and Rash  . Oxycodone Hcl Hives and Itching  . Dilaudid [Hydromorphone Hcl] Itching    Patient Measurements:    Vital Signs: Temp: 97 F (36.1 C) (03/22 1506) Temp Source: Tympanic (03/22 1011) BP: 140/87 (03/22 1506) Pulse Rate: 87 (03/22 1506)  Labs: Recent Labs    04/09/19 1026 04/09/19 1035  HGB  --  11.2*  HCT  --  33.0*  LABPROT 22.7*  --   INR 2.0*  --   CREATININE  --  1.40*    Estimated Creatinine Clearance: 55.9 mL/min (A) (by C-G formula based on SCr of 1.4 mg/dL (H)).   Medical History: Past Medical History:  Diagnosis Date  . (HFpEF) heart failure with preserved ejection fraction ([place])    a. 08/2017 Echo: EF 55-60%.  Grade 2 diastolic dysfunction.  Moderate mitral stenosis.  . Allergy   . Anemia   . Anxiety   . BRCA negative 03/22/2013  . Bronchitis 02/19/2016   ON LEVAQUIN PO  . Chest tightness   . Cigarette smoker 09/11/2017   8-10 day  . Dyspnea   . GERD (gastroesophageal reflux disease)   . History of kidney stones   . Hyperlipidemia   . Hypertension   . Interstitial lung disease ([place])    a. CT 2013 b. 02/2018 CXR noted recurrent intersistial changes ILD vs chronic bronchitis  . Moderate mitral stenosis    a.  08/2017 TEE: EF 60 to 65%.  Moderate mitral stenosis.  Mean gradient 14 mmHg.  Valve area 2.59 cm by planimetry, 2.72 cm by pressure half-time.  . Persistent atrial fibrillation ([place])    a. 08/2017 s/p TEE/DCCV; b. CHA2DS2VASc = 2-->warfarin.    Medications:  Medications Prior to Admission  Medication Sig Dispense Refill Last Dose  . albuterol (VENTOLIN HFA) 108 (90 Base) MCG/ACT inhaler Inhale 2 puffs into the lungs every 4 (four) hours as needed for wheezing or shortness of breath. 3 Inhaler 0 Past Week at Unknown time  . ALPRAZolam (XANAX) 0.5 MG tablet Take 1 tablet (0.5 mg total) by mouth  at bedtime as needed for anxiety. 30 tablet 2 Past Week at Unknown time  . atorvastatin (LIPITOR) 80 MG tablet Take 1 tablet (80 mg total) by mouth daily at 6 PM. (Patient taking differently: Take 80 mg by mouth at bedtime. ) 90 tablet 3 04/08/2019 at Unknown time  . diltiazem (CARDIZEM CD) 120 MG 24 hr capsule Take 1 capsule (120 mg total) by mouth daily. 90 capsule 3 04/09/2019 at Unknown time  . metoprolol succinate (TOPROL-XL) 50 MG 24 hr tablet Take 1-2 tablets (50-100 mg total) by mouth as directed. Take 1 tablet (50 mg) in the Morning & 2 tablets (100 mg) at night. (Patient taking differently: Take 50-100 mg by mouth See admin instructions. Take 1 tablet (50 mg) in the Morning & 2 tablets (100 mg) at night.) 270 tablet 3 04/09/2019 at Unknown time  . omeprazole (PRILOSEC) 40 MG capsule Take 1 capsule (40 mg total) by mouth daily. 90 capsule 1 04/09/2019 at Unknown time  . phenazopyridine (PYRIDIUM) 200 MG tablet Take 1 tablet (200 mg total) by mouth 3 (three) times daily as needed for pain. 10 tablet 0   . torsemide (DEMADEX) 20 MG tablet Take 2 tablets (40 mg total) by mouth 2 (two) times daily. 360 tablet 3 04/08/2019 at Unknown time  . warfarin (COUMADIN)  2 MG tablet TAKE AS DIRECTED BY COUMADIN CLINIC (Patient taking differently: Take 2 mg by mouth See admin instructions. Take 1 tablet (2 mg) by mouth on 'Sundays, Mondays, Wednesdays, Fridays, & Saturdays.) 30 tablet 1 04/03/2019  . warfarin (COUMADIN) 2.5 MG tablet Take 1.25 mg by mouth See admin instructions. Take 0.5 tablet (1.25 mg) by mouth on Tuesdays & Thursdays.   04/03/2019  . potassium chloride (KLOR-CON) 10 MEQ tablet Take 20 mEq by mouth in the morning and at bedtime.   Not Taking at Unknown time   Scheduled:  . fentaNYL       Infusions:  . lactated ringers 50 mL/hr at 04/09/19 1033  . tranexamic acid     PRN: fentaNYL (SUBLIMAZE) injection, promethazine Anti-infectives (From admission, onward)   Start     Dose/Rate Route Frequency  Ordered Stop   04/09/19 1014  ceFAZolin (ANCEF) 2-4 GM/100ML-% IVPB    Note to Pharmacy: Moore, Jordan   : cabinet override      03' /22/21 1014 04/09/19 1130   04/09/19 1000  ceFAZolin (ANCEF) IVPB 2g/100 mL premix     2 g 200 mL/hr over 30 Minutes Intravenous On call to O.R. 04/09/19 0956 04/09/19 1154      Assessment: Pharmacy consulted to restart warfarin. INR is 2. CBC stable. Pt is on warfarin for afib.   Home dose: Take 1 tablet (2 mg) by mouth on Sundays, Mondays, Wednesdays, Fridays, & Saturdays. Take 0.5 tablet (1.25 mg) by mouth on Tuesdays & Thursdays.  Date INR Warfarin Dose  3/'22 2 2 ' mg         Goal of Therapy:  INR 2-3 Monitor platelets by anticoagulation protocol: Yes   Plan:  INR is therapeutic. Will give warfarin 2 mg x 1 (home dose). Daily INR and CBC every 3 days while on warfarin.   Oswald Hillock, PharmD, BCPS 04/09/2019,3:31 PM

## 2019-04-09 NOTE — H&P (Signed)
The patient has been re-examined, and the chart reviewed, and there have been no interval changes to the documented history and physical.    The risks, benefits, and alternatives have been discussed at length. The patient expressed understanding of the risks benefits and agreed with plans for surgical intervention.  James P. Hooten, Jr. M.D.    

## 2019-04-09 NOTE — Op Note (Signed)
OPERATIVE NOTE  DATE OF SURGERY:  04/09/2019  PATIENT NAME:  Leslie [name]   DOB: 15-Aug-1965  MRN: [id number]  PRE-OPERATIVE DIAGNOSIS: Degenerative arthrosis of the right knee, primary  POST-OPERATIVE DIAGNOSIS:  Same  PROCEDURE:  Right total knee arthroplasty using computer-assisted navigation  SURGEON:  Marciano Sequin. M.D.  ASSISTANT: Cassell Smiles, PA-C (present and scrubbed throughout the case, critical for assistance with exposure, retraction, instrumentation, and closure)  ANESTHESIA: general  ESTIMATED BLOOD LOSS: 50 mL  FLUIDS REPLACED: 1600 mL of crystalloid  TOURNIQUET TIME: 96 minutes  DRAINS: 2 medium Hemovac drains  SOFT TISSUE RELEASES: Anterior cruciate ligament, posterior cruciate ligament, deep medial collateral ligament, patellofemoral ligament  IMPLANTS UTILIZED: DePuy Attune size 4N posterior stabilized femoral component (cemented), size 4 rotating platform tibial component (cemented), 35 mm medialized dome patella (cemented), and a 7 mm stabilized rotating platform polyethylene insert.  INDICATIONS FOR SURGERY: Leslie [name] is a 54 y.o. year old female with a long history of progressive knee pain. X-rays demonstrated severe degenerative changes in tricompartmental fashion. The patient had not seen any significant improvement despite conservative nonsurgical intervention. After discussion of the risks and benefits of surgical intervention, the patient expressed understanding of the risks benefits and agree with plans for total knee arthroplasty.   The risks, benefits, and alternatives were discussed at length including but not limited to the risks of infection, bleeding, nerve injury, stiffness, blood clots, the need for revision surgery, cardiopulmonary complications, among others, and they were willing to proceed.  PROCEDURE IN DETAIL: The patient was brought into the operating room and, after adequate general anesthesia was achieved, a tourniquet was placed  on the patient's upper thigh. The patient's knee and leg were cleaned and prepped with alcohol and DuraPrep and draped in the usual sterile fashion. A "timeout" was performed as per usual protocol. The lower extremity was exsanguinated using an Esmarch, and the tourniquet was inflated to 300 mmHg. An anterior longitudinal incision was made followed by a standard mid vastus approach. The deep fibers of the medial collateral ligament were elevated in a subperiosteal fashion off of the medial flare of the tibia so as to maintain a continuous soft tissue sleeve. The patella was subluxed laterally and the patellofemoral ligament was incised. Inspection of the knee demonstrated severe degenerative changes with full-thickness loss of articular cartilage. Osteophytes were debrided using a rongeur. Anterior and posterior cruciate ligaments were excised. Two 4.0 mm Schanz pins were inserted in the femur and into the tibia for attachment of the array of trackers used for computer-assisted navigation. Hip center was identified using a circumduction technique. Distal landmarks were mapped using the computer. The distal femur and proximal tibia were mapped using the computer. The distal femoral cutting guide was positioned using computer-assisted navigation so as to achieve a 5 distal valgus cut. The femur was sized and it was felt that a size 4N femoral component was appropriate. A size 4 femoral cutting guide was positioned and the anterior cut was performed and verified using the computer. This was followed by completion of the posterior and chamfer cuts. Femoral cutting guide for the central box was then positioned in the center box cut was performed.  Attention was then directed to the proximal tibia. Medial and lateral menisci were excised. The extramedullary tibial cutting guide was positioned using computer-assisted navigation so as to achieve a 0 varus-valgus alignment and 3 posterior slope. The cut was performed  and verified using the computer. The proximal tibia was sized and  it was felt that a size 4 tibial tray was appropriate. Tibial and femoral trials were inserted followed by insertion of a 7 mm polyethylene insert. This allowed for excellent mediolateral soft tissue balancing both in flexion and in full extension. Finally, the patella was cut and prepared so as to accommodate a 35 mm medialized dome patella. A patella trial was placed and the knee was placed through a range of motion with excellent patellar tracking appreciated. The femoral trial was removed after debridement of posterior osteophytes. The central post-hole for the tibial component was reamed followed by insertion of a keel punch. Tibial trials were then removed. Cut surfaces of bone were irrigated with copious amounts of normal saline with antibiotic solution using pulsatile lavage and then suctioned dry. Polymethylmethacrylate cement with gentamicin was prepared in the usual fashion using a vacuum mixer. Cement was applied to the cut surface of the proximal tibia as well as along the undersurface of a size 4 rotating platform tibial component. Tibial component was positioned and impacted into place. Excess cement was removed using [employer] fast streamer. Cement was then applied to the cut surfaces of the femur as well as along the posterior flanges of the size 4N femoral component. The femoral component was positioned and impacted into place. Excess cement was removed using [employer] fast streamer. A 7 mm polyethylene trial was inserted and the knee was brought into full extension with steady axial compression applied. Finally, cement was applied to the backside of a 35 mm medialized dome patella and the patellar component was positioned and patellar clamp applied. Excess cement was removed using [employer] fast streamer. After adequate curing of the cement, the tourniquet was deflated after a total tourniquet time of 96 minutes. Hemostasis was achieved using  electrocautery. The knee was irrigated with copious amounts of normal saline with antibiotic solution using pulsatile lavage and then suctioned dry. 20 mL of 1.3% Exparel and 60 mL of 0.25% Marcaine in 40 mL of normal saline was injected along the posterior capsule, medial and lateral gutters, and along the arthrotomy site. A 7 mm stabilized rotating platform polyethylene insert was inserted and the knee was placed through a range of motion with excellent mediolateral soft tissue balancing appreciated and excellent patellar tracking noted. 2 medium drains were placed in the wound bed and brought out through separate stab incisions. The medial parapatellar portion of the incision was reapproximated using interrupted sutures of #1 Vicryl. Subcutaneous tissue was approximated in layers using first #0 Vicryl followed #2-0 Vicryl. The skin was approximated with skin staples. A sterile dressing was applied.  The patient tolerated the procedure well and was transported to the recovery room in stable condition.    Leslie [name]., M.D.

## 2019-04-09 NOTE — Transfer of Care (Signed)
Immediate Anesthesia Transfer of Care Note  Patient: Leslie [name]  Procedure(s) Performed: Procedure(s): COMPUTER ASSISTED TOTAL KNEE ARTHROPLASTY (Right)  Patient Location: PACU  Anesthesia Type:General  Level of Consciousness: sedated  Airway & Oxygen Therapy: Patient Spontanous Breathing and Patient connected to face mask oxygen  Post-op Assessment: Report given to RN and Post -op Vital signs reviewed and stable  Post vital signs: Reviewed and stable  Last Vitals:  Vitals:   04/09/19 1011 04/09/19 1506  BP: (!) 145/75 140/87  Pulse: 68 87  Resp: 16 16  Temp: 37.1 C   SpO2: 123XX123 123XX123    Complications: No apparent anesthesia complications

## 2019-04-10 LAB — PROTIME-INR
INR: 2 — ABNORMAL HIGH (ref 0.8–1.2)
Prothrombin Time: 22.7 seconds — ABNORMAL HIGH (ref 11.4–15.2)

## 2019-04-10 MED ORDER — WARFARIN 1.25 MG HALF TABLET
1.2500 mg | ORAL_TABLET | Freq: Once | ORAL | Status: AC
  Administered 2019-04-10: 1.25 mg via ORAL
  Filled 2019-04-10 (×2): qty 1

## 2019-04-10 NOTE — Evaluation (Signed)
Physical Therapy Evaluation Patient Details Name: Leslie [name] MRN: [id number] DOB: [birth year] Today's Date: 04/10/2019   History of Present Illness  Pt is a 54 y.o. female s/p R TKA secondary degenerative arthrosis 04/09/19.  PMH includes anxiety, a-fib, htn, sepsis, CAD, CHF, and L TKA 12/25/14 (Dr. Mack Guise).  Clinical Impression  Prior to hospital admission, pt was ambulatory (use of cane as needed) and lives with her husband in 1 level home with 4 STE (husband currently trying to get railing installed).  Currently pt is min to mod assist semi-supine to sitting edge of bed; mod assist for transfers; and CGA to min assist with ambulation bed to recliner with RW.  Oxygen 90% on room air resting in bed beginning of session; O2 decreased to 86% after mobility to chair (pt reporting being mildly lightheaded) and pt placed on 2 L with O2 increasing to 96% at rest (with improvement of symptoms) in recliner end of session.  Pain 8/10 R knee at rest beginning of session and 5/10 at rest end of session.  R knee ROM 0-80 degrees AROM.  Pt would benefit from skilled PT to address noted impairments and functional limitations (see below for any additional details).    Follow Up Recommendations Follow surgeon's recommendation for DC plan and follow-up therapies    Equipment Recommendations  Rolling walker with 5" wheels;3in1 (PT)    Recommendations for Other Services       Precautions / Restrictions Precautions Precautions: Knee;Fall Precaution Booklet Issued: Yes (comment) Restrictions Weight Bearing Restrictions: Yes RLE Weight Bearing: Weight bearing as tolerated      Mobility  Bed Mobility Overal bed mobility: Needs Assistance Bed Mobility: Supine to Sit   Supine to sit: Min assist;Mod assist;HOB elevated   General bed mobility comments: assist for R LE and trunk; increased effort to perform  Transfers Overall transfer level: Needs assistance Equipment used: Rolling walker (2  wheeled) Transfers: Sit to/from Stand Sit to Stand: Mod assist;From elevated surface         General transfer comment: unable to stand 1st attempt standing with assist but able to stand 2nd attempt standing with bed height elevated and vc's for UE/LE placement; assist to initiate and come to full stand  Ambulation/Gait Ambulation/Gait assistance: Min guard;Min assist Gait Distance (Feet): 3 Feet(bed to recliner) Assistive device: Rolling walker (2 wheeled)   Gait velocity: decreased   General Gait Details: antalgic; decreased stance time R LE; vc's for walker use; occasional assist to steady; vc's for gait technique and to increase UE support on walker to off-weight R LE  Stairs            Wheelchair Mobility    Modified Rankin (Stroke Patients Only)       Balance Overall balance assessment: Needs assistance Sitting-balance support: No upper extremity supported;Feet supported Sitting balance-Leahy Scale: Good Sitting balance - Comments: steady sitting reaching within BOS   Standing balance support: Bilateral upper extremity supported Standing balance-Leahy Scale: Poor Standing balance comment: pt requiring B UE support on RW for static standing balance                             Pertinent Vitals/Pain Pain Assessment: 0-10 Pain Score: 5  Faces Pain Scale: Hurts little more Pain Location: R knee/surgical site Pain Descriptors / Indicators: Aching;Discomfort;Sore Pain Intervention(s): Limited activity within patient's tolerance;Monitored during session;Premedicated before session;Repositioned;Other (comment)(Polar care applied and activated)  HR WFL during sessions activities.  Home Living Family/patient expects to be discharged to:: Private residence Living Arrangements: Spouse/significant other Available Help at Discharge: Family;Available 24 hours/day(Husband able to assist 24/7 initially) Type of Home: House Home Access: Stairs to  enter Entrance Stairs-Rails: None(Pt's husband reports he is trying to install R railing prior to pt discharge home) Entrance Stairs-Number of Steps: 4 Home Layout: One level Home Equipment: Toilet riser;Cane - single point Additional Comments: Patient states she has no DME    Prior Function Level of Independence: Independent with assistive device(s)         Comments: Pt reports using SPC as needed; no falls in past 6 months.     Hand Dominance   Dominant Hand: Right    Extremity/Trunk Assessment   Upper Extremity Assessment Upper Extremity Assessment: Overall WFL for tasks assessed    Lower Extremity Assessment Lower Extremity Assessment: RLE deficits/detail;LLE deficits/detail RLE Deficits / Details: fair quad set strength; at least 3/5 AROM ankle DF/PF RLE: Unable to fully assess due to pain LLE Deficits / Details: strength and ROM WFL    Cervical / Trunk Assessment Cervical / Trunk Assessment: Normal  Communication   Communication: No difficulties  Cognition Arousal/Alertness: Awake/alert Behavior During Therapy: WFL for tasks assessed/performed Overall Cognitive Status: Within Functional Limits for tasks assessed                                        General Comments General comments (skin integrity, edema, etc.): R knee hemovac appearing intact beginning/end of session.  Pt agreeable to PT session.  Pt's husband present during session.    Exercises Total Joint Exercises Ankle Circles/Pumps: AROM;Strengthening;Both;10 reps;Supine Quad Sets: AROM;Strengthening;Both;10 reps;Supine Short Arc Quad: AAROM;Strengthening;Right;10 reps;Supine Heel Slides: AAROM;Strengthening;Right;10 reps;Supine Hip ABduction/ADduction: AAROM;Strengthening;Right;10 reps;Supine Straight Leg Raises: AAROM;Strengthening;Right;10 reps;Supine Goniometric ROM: R knee AROM 0-80 degrees   Assessment/Plan    PT Assessment Patient needs continued PT services  PT Problem  List Decreased strength;Decreased activity tolerance;Decreased range of motion;Decreased balance;Decreased mobility;Decreased knowledge of use of DME;Decreased knowledge of precautions;Pain;Decreased skin integrity       PT Treatment Interventions DME instruction;Gait training;Stair training;Functional mobility training;Therapeutic activities;Therapeutic exercise;Balance training;Patient/family education    PT Goals (Current goals can be found in the Care Plan section)  Acute Rehab PT Goals Patient Stated Goal: to improve pain and mobility PT Goal Formulation: With patient Time For Goal Achievement: 04/24/19 Potential to Achieve Goals: Good    Frequency BID   Barriers to discharge        Co-evaluation               AM-PAC PT "6 Clicks" Mobility  Outcome Measure Help needed turning from your back to your side while in a flat bed without using bedrails?: A Little Help needed moving from lying on your back to sitting on the side of a flat bed without using bedrails?: A Lot Help needed moving to and from a bed to a chair (including a wheelchair)?: A Little Help needed standing up from a chair using your arms (e.g., wheelchair or bedside chair)?: A Lot Help needed to walk in hospital room?: A Little Help needed climbing 3-5 steps with a railing? : Total 6 Click Score: 14    End of Session Equipment Utilized During Treatment: Gait belt Activity Tolerance: Patient limited by pain Patient left: in chair;with call bell/phone within reach;with chair alarm set;with family/visitor present;with SCD's reapplied;Other (comment)(B heels floating via  towel rolls; polar care in place and activated) Nurse Communication: Mobility status;Precautions;Weight bearing status(via white board) PT Visit Diagnosis: Other abnormalities of gait and mobility (R26.89);Muscle weakness (generalized) (M62.81);Difficulty in walking, not elsewhere classified (R26.2);Pain Pain - Right/Left: Right Pain - part of  body: Knee    Time: [id number] PT Time Calculation (min) (ACUTE ONLY): 37 min   Charges:   PT Evaluation $PT Eval Low Complexity: 1 Low PT Treatments $Therapeutic Exercise: 8-22 mins $Therapeutic Activity: 8-22 mins       Leitha Bleak, PT 04/10/19, 1:18 PM

## 2019-04-10 NOTE — Progress Notes (Signed)
  Subjective: 1 Day Post-Op Procedure(s) (LRB): COMPUTER ASSISTED TOTAL KNEE ARTHROPLASTY (Right) Patient reports pain as well-controlled.   Patient is well, but has had some minor complaints of shortness of breath Plan is to go Home after hospital stay. Negative for chest pain  Fever: no Gastrointestinal: negative for nausea and vomiting.  Patient has not had a bowel movement.  Objective: Vital signs in last 24 hours: Temp:  [97 F (36.1 C)-98.7 F (37.1 C)] 98.4 F (36.9 C) (03/23 0800) Pulse Rate:  [66-89] 66 (03/23 0800) Resp:  [11-19] 18 (03/23 0529) BP: (102-156)/(61-88) 118/61 (03/23 0800) SpO2:  [97 %-100 %] 100 % (03/23 0800)  Intake/Output from previous day:  Intake/Output Summary (Last 24 hours) at 04/10/2019 0801 Last data filed at 04/10/2019 0500 Gross per 24 hour  Intake 3253.61 ml  Output 710 ml  Net 2543.61 ml    Intake/Output this shift: No intake/output data recorded.  Labs: Recent Labs    04/09/19 1035  HGB 11.2*   Recent Labs    04/09/19 1035  HCT 33.0*   Recent Labs    04/09/19 1035 04/09/19 1655  NA 139  --   K 3.6 4.7  CL 99  --   BUN 18  --   CREATININE 1.40*  --   GLUCOSE 88  --    Recent Labs    04/09/19 1026 04/10/19 0453  INR 2.0* 2.0*     EXAM General - Patient is Alert, Appropriate and Oriented Extremity - Neurovascular intact Dorsiflexion/Plantar flexion intact Compartment soft with some tenderness but negative Homan's  Dressing/Incision -Postoperative dressing remains in place., Polar Care in place and working. , Hemovac in place.  Motor Function - intact, moving foot and toes well on exam.  Cardiovascular- Regular rate and rhythm, no murmurs/rubs/gallops Respiratory- slight wheezes heard bilaterally Gastrointestinal- soft, nontender and active bowel sounds   Assessment/Plan: 1 Day Post-Op Procedure(s) (LRB): COMPUTER ASSISTED TOTAL KNEE ARTHROPLASTY (Right) Active Problems:   Total knee replacement  status  Estimated body mass index is 40.17 kg/m as calculated from the following:   Height as of 03/29/19: 5\' 4"  (1.626 m).   Weight as of 03/29/19: 106.1 kg. Up with therapy Plan for discharge tomorrow Albuterol breathing treatment ordered.   DVT Prophylaxis - Ted hose, foot pumps and Coumadin Weight-Bearing as tolerated to right leg  Cassell Smiles, PA-C [facility] 04/10/2019, 8:01 AM

## 2019-04-10 NOTE — Progress Notes (Signed)
Physical Therapy Treatment Patient Details Name: Leslie [name] MRN: [id number] DOB: 1965/04/08 Today's Date: 04/10/2019    History of Present Illness Pt is a 54 y.o. female s/p R TKA secondary degenerative arthrosis 04/09/19.  PMH includes anxiety, a-fib, htn, sepsis, CAD, CHF, and L TKA 12/25/14 (Dr. Mack Guise).    PT Comments    Pt resting in recliner upon PT arrival.  Performed sitting LE ex's.  Able to stand with CGA up to RW and then walk 70 feet with RW CGA; vc's for gait technique.  Pain 6/10 R knee at rest beginning of session but appearing with mild pain at rest end of session resting in bed.  O2 sats 94% or greater on 2 L O2 via nasal cannula during sessions activities.  Will continue to focus on strengthening, knee ROM, and progressive functional mobility during hospitalization.   Follow Up Recommendations  Follow surgeon's recommendation for DC plan and follow-up therapies     Equipment Recommendations  Rolling walker with 5" wheels;3in1 (PT)    Recommendations for Other Services       Precautions / Restrictions Precautions Precautions: Knee;Fall Precaution Booklet Issued: Yes (comment) Restrictions Weight Bearing Restrictions: Yes RLE Weight Bearing: Weight bearing as tolerated    Mobility  Bed Mobility Overal bed mobility: Needs Assistance Bed Mobility: Sit to Supine     Sit to supine: Supervision;HOB elevated   General bed mobility comments: pt using L LE under R LE to assist bringing R LE into bed  Transfers Overall transfer level: Needs assistance Equipment used: Rolling walker (2 wheeled) Transfers: Sit to/from Stand Sit to Stand: Min guard         General transfer comment: vc's for scooting to edge of recliner, UE/LE placement, and overall technique for sit to stand; vc's for bringing R LE forward prior to sitting down on bed  Ambulation/Gait Ambulation/Gait assistance: Min guard;+2 safety/equipment Gait Distance (Feet): 70 Feet Assistive device:  Rolling walker (2 wheeled)   Gait velocity: decreased   General Gait Details: antalgic; decreased stance time R LE; vc's for stepping technique including R heel strike during R LE advancement and R quad set during R LE stance phase; vc's to increase UE support through [employer]    Modified Rankin (Stroke Patients Only)       Balance Overall balance assessment: Needs assistance Sitting-balance support: No upper extremity supported;Feet supported Sitting balance-Leahy Scale: Good Sitting balance - Comments: steady sitting reaching within BOS   Standing balance support: Single extremity supported Standing balance-Leahy Scale: Poor Standing balance comment: pt requiring at least single UE support on RW for static standing balance                            Cognition Arousal/Alertness: Awake/alert Behavior During Therapy: WFL for tasks assessed/performed Overall Cognitive Status: Within Functional Limits for tasks assessed                                        Exercises Total Joint Exercises Long Arc Quad: AAROM;Strengthening;Right;10 reps;Seated General Exercises - Lower Extremity Hip Flexion/Marching: AROM;Strengthening;Both;10 reps;Seated Other Exercises Other Exercises: R knee flexion self stretching 3x15 seconds sitting in recliner    General Comments General comments (skin integrity, edema, etc.): R knee hemovac appearing intact beginning/end of session.  Pt agreeable to PT session.  Pt's husband present during session.      Pertinent Vitals/Pain Pain Assessment: Faces Pain Location: R knee/surgical site Pain Descriptors / Indicators: Aching;Discomfort;Sore Pain Intervention(s): Limited activity within patient's tolerance;Monitored during session;Premedicated before session;Repositioned(polar care applied and activated)  Vitals (HR and O2 on 2 L via nasal cannula) stable and WFL throughout  treatment session.    Home Living Family/patient expects to be discharged to:: Private residence Living Arrangements: Spouse/significant other Available Help at Discharge: Family;Available 24 hours/day(Husband able to assist 24/7 initially) Type of Home: House Home Access: Stairs to enter Entrance Stairs-Rails: None(Pt's husband reports he is trying to install R railing prior to pt discharge home) Home Layout: One level Home Equipment: Toilet riser;Cane - single point      Prior Function Level of Independence: Independent with assistive device(s)      Comments: Pt reports using SPC as needed; no falls in past 6 months.   PT Goals (current goals can now be found in the care plan section) Acute Rehab PT Goals Patient Stated Goal: to improve pain and mobility PT Goal Formulation: With patient Time For Goal Achievement: 04/24/19 Potential to Achieve Goals: Good Progress towards PT goals: Progressing toward goals    Frequency    BID      PT Plan Current plan remains appropriate    Co-evaluation              AM-PAC PT "6 Clicks" Mobility   Outcome Measure  Help needed turning from your back to your side while in a flat bed without using bedrails?: A Little Help needed moving from lying on your back to sitting on the side of a flat bed without using bedrails?: A Little Help needed moving to and from a bed to a chair (including a wheelchair)?: A Little Help needed standing up from a chair using your arms (e.g., wheelchair or bedside chair)?: A Little Help needed to walk in hospital room?: A Little Help needed climbing 3-5 steps with a railing? : A Lot 6 Click Score: 17    End of Session Equipment Utilized During Treatment: Gait belt Activity Tolerance: Patient tolerated treatment well Patient left: in bed;with call bell/phone within reach;with bed alarm set;with family/visitor present;with SCD's reapplied;Other (comment)(R heel in bone foam; L heel floating via towel  roll; polar care in place and activated) Nurse Communication: Mobility status;Precautions;Weight bearing status;Other (comment)(via white board) PT Visit Diagnosis: Other abnormalities of gait and mobility (R26.89);Muscle weakness (generalized) (M62.81);Difficulty in walking, not elsewhere classified (R26.2);Pain Pain - Right/Left: Right Pain - part of body: Knee     Time: [id number] PT Time Calculation (min) (ACUTE ONLY): 46 min  Charges:  $Gait Training: 8-22 mins $Therapeutic Exercise: 8-22 mins $Therapeutic Activity: 8-22 mins                     Leitha Bleak, PT 04/10/19, 3:47 PM

## 2019-04-10 NOTE — Anesthesia Postprocedure Evaluation (Signed)
Anesthesia Post Note  Patient: Education officer, environmental  Procedure(s) Performed: COMPUTER ASSISTED TOTAL KNEE ARTHROPLASTY (Right Knee)  Patient location during evaluation: PACU Anesthesia Type: General Level of consciousness: awake and alert Pain management: pain level controlled Vital Signs Assessment: post-procedure vital signs reviewed and stable Respiratory status: spontaneous breathing, nonlabored ventilation and respiratory function stable Cardiovascular status: blood pressure returned to baseline and stable Postop Assessment: no apparent nausea or vomiting Anesthetic complications: no     Last Vitals:  Vitals:   04/09/19 2347 04/10/19 0529  BP: 102/77 110/82  Pulse: 86 82  Resp: 19 18  Temp: 36.8 C 36.7 C  SpO2: 97% 98%    Last Pain:  Vitals:   04/10/19 0529  TempSrc: Oral  PainSc:                  Tera Mater

## 2019-04-10 NOTE — Evaluation (Signed)
Occupational Therapy Evaluation Patient Details Name: Leslie [name] MRN: [id number] DOB: 24-Jul-1965 Today's Date: 04/10/2019    History of Present Illness Leslie [name] is a 54 y.o. female that presents after right total knee arthroplasty on 04/09/19 by Dr. Marry Guan.   Clinical Impression   Patient in room with husband at beside upon entry.  Pt agreeable to therapy session.  Patient reports no pain but slight discomfort while supine in bed.  Previously, patient was I with all (B/I)ADLs and working from home.  Patient states she has no DME/AE at home.  Husband plans on installing railing by entry steps prior to her discharge.  Requires extra time for all tasks due to discomfort of operative leg.  Completed bed mobility to EOB with CGA for operative leg and HOB elevated.  Provided education to patient on use of adaptive equipment and compensatory techniques to safely perform self care, functional transfers and bed mobility.  Patient verbalized understanding. Performed sit<>stand from elevated surface on 3 trials requiring MOD A.  Patient with fair/poor standing balance and complaints of dizziness.  Patient with posterior lean and forward head posture in standing.  Assisted patient to sitting with MIN A.  BP at 131/58, 78HR.  Supplemental 02 was removed from pt at beginning of session and remained above 93% during all activities.  Patient would benefit from continued occupational therapy services to address deficits in performance components outlined below.  Per case manager, no HH agencies will accept pt's insurance and she is currently set up with OP upon d/c.  Recommending 24/7 SPV at this time and DME listed below.    Follow Up Recommendations  Outpatient OT;Other (comment)(Unable to find [facility] agency that works with [employer].  Set up with OP.  However, patient would benefit from [facility].)    Equipment Recommendations  3 in 1 bedside commode;Other (comment);Tub/shower seat(2WW)    Recommendations  for Other Services       Precautions / Restrictions Precautions Precautions: Knee;Fall Restrictions Weight Bearing Restrictions: Yes RLE Weight Bearing: Weight bearing as tolerated      Mobility Bed Mobility Overal bed mobility: Needs Assistance Bed Mobility: Rolling;Supine to Sit;Sit to Supine Rolling: Min assist(use of hand rails)   Supine to sit: Min guard;HOB elevated Sit to supine: Min guard   General bed mobility comments: Educated patient on use of compensatory techniques to move R LE in/out of bed  Transfers Overall transfer level: Needs assistance Equipment used: Standard walker Transfers: Sit to/from Stand Sit to Stand: Mod assist;From elevated surface         General transfer comment: Trial x 3 with 3rd attempt successful.  Education on body mechanics and sequencing to improve independence and safety.    Balance Overall balance assessment: Mild deficits observed, not formally tested                                         ADL either performed or assessed with clinical judgement   ADL Overall ADL's : Needs assistance/impaired     Grooming: Wash/dry hands;Wash/dry face;Bed level;Set up           Upper Body Dressing : Sitting;Set up   Lower Body Dressing: Maximal assistance;Cueing for safety;Cueing for sequencing;Cueing for compensatory techniques;Sit to/from stand                 General ADL Comments: Patient having more difficulty this AM with movement  of R LE in standing or sitting.     Vision Patient Visual Report: No change from baseline       Perception     Praxis      Pertinent Vitals/Pain Pain Assessment: Faces Faces Pain Scale: Hurts little more Pain Location: R knee/surgical site Pain Descriptors / Indicators: Aching;Discomfort;Sore Pain Intervention(s): Limited activity within patient's tolerance;Monitored during session;Repositioned;Ice applied     Hand Dominance Right   Extremity/Trunk Assessment  Upper Extremity Assessment Upper Extremity Assessment: Overall WFL for tasks assessed   Lower Extremity Assessment Lower Extremity Assessment: Defer to PT evaluation   Cervical / Trunk Assessment Cervical / Trunk Assessment: Normal   Communication Communication Communication: No difficulties   Cognition Arousal/Alertness: Awake/alert Behavior During Therapy: WFL for tasks assessed/performed Overall Cognitive Status: Within Functional Limits for tasks assessed                                     General Comments  Patient very shakey in standing with B UE support. Voicing feeling dizzy and demonstrating posterior lean.  Only able to tolerate static standing for ~15 seconds with CGA-MIN A to maintain balance.    Exercises Other Exercises Other Exercises: Provided education on role and goals of OT in acute care Other Exercises: Provided education on use of compensatory techniques and AE to perform self care, functional transfers and bed mobility Other Exercises: Provided education on healing time and appropriate positioning of operative LE Other Exercises: Provided education on home modification recommendations to improve safety upon discharge   Shoulder Instructions      Home Living Family/patient expects to be discharged to:: Private residence Living Arrangements: Spouse/significant other;Parent Available Help at Discharge: Family;Available PRN/intermittently(Husband and daughter can assist intermittently) Type of Home: House Home Access: Stairs to enter [employer] of Steps: 3 Entrance Stairs-Rails: None(Husband reports he will be installing handrails prior to discharge) Home Layout: One level     Bathroom Shower/Tub: Occupational psychologist: Standard         Additional Comments: Patient states she has no DME      Prior Functioning/Environment Level of Independence: Independent        Comments: Patient reports she was I with  (B/I)ADLs and was driving.  Patient currently working from home.  Did not utilize AD/DME for functional transfers or mobility.        OT Problem List: Decreased strength;Decreased range of motion;Decreased activity tolerance;Impaired balance (sitting and/or standing);Pain;Decreased knowledge of use of DME or AE;Decreased knowledge of precautions      OT Treatment/Interventions: Self-care/ADL training;Therapeutic exercise;Energy conservation;DME and/or AE instruction;Therapeutic activities;Patient/family education    OT Goals(Current goals can be found in the care plan section) Acute Rehab OT Goals Patient Stated Goal: "I want to move better" OT Goal Formulation: With patient Time For Goal Achievement: 04/24/19 Potential to Achieve Goals: Good  OT Frequency: Min 3X/week   Barriers to D/C:            Co-evaluation              AM-PAC OT "6 Clicks" Daily Activity     Outcome Measure Help from another person eating meals?: None Help from another person taking care of personal grooming?: None Help from another person toileting, which includes using toliet, bedpan, or urinal?: A Lot Help from another person bathing (including washing, rinsing, drying)?: A Lot Help from another person to put on  and taking off regular upper body clothing?: None Help from another person to put on and taking off regular lower body clothing?: A Lot 6 Click Score: 18   End of Session Equipment Utilized During Treatment: Gait belt;Rolling walker Nurse Communication: Other (comment)(Discussed IV with nurse)  Activity Tolerance: Other (comment);Patient limited by pain(Limited in standing activity secondary to dizziness) Patient left: in bed;with call bell/phone within reach;with bed alarm set;with family/visitor present  OT Visit Diagnosis: Unsteadiness on feet (R26.81);Other (comment)(R TKA)                Time: [id number] OT Time Calculation (min): 50 min Charges:  OT General Charges $OT Visit: 1  Visit OT Evaluation $OT Eval Low Complexity: 1 Low OT Treatments $Self Care/Home Management : 8-22 mins $Therapeutic Activity: 38-52 mins  Baldomero Lamy, MS, OTR/L 04/10/19, 10:57 AM

## 2019-04-10 NOTE — TOC Initial Note (Signed)
Transition of Care [facility]) - Initial/Assessment Note    Patient Details  Name: Leslie [name] MRN: [id number] Date of Birth: 1965-08-04  [facility]) CM/SW Contact:    Shade Flood, LCSW Phone Number: 04/10/2019, 3:49 PM  Clinical Narrative:                  Pt admitted from home. She had TKA. Plan is for dc home on 3/24. Pt is unable to have [place] at dc as there are no HH agencies willing to accept her insurance. Updated PA who states that they already have orders placed for outpatient therapy at dc.  DME recommendations are for 3in1 and RW. Spoke with pt who would like TOC to arrange both these items. Arranged with [employer] at pt request and items delivered to her room today in anticipation of dc tomorrow.  Assigned TOC will follow and assist if any other DC needs identified.  Expected Discharge Plan: Home/Self Care Barriers to Discharge: Continued Medical Work up   Patient Goals and CMS Choice Patient states their goals for this hospitalization and ongoing recovery are:: return home CMS Medicare.gov Compare Post Acute Care list provided to:: Patient Choice offered to / list presented to : Patient  Expected Discharge Plan and Services Expected Discharge Plan: Home/Self Care In-house Referral: Clinical Social Work   Post Acute Care Choice: Durable Medical Equipment Living arrangements for the past 2 months: Single Family Home                 DME Arranged: Walker rolling, 3-N-1 DME Agency: [facility] Date DME Agency Contacted: 04/10/19   Representative spoke with at DME Agency: Leroy Sea            Prior Living Arrangements/Services Living arrangements for the past 2 months: [place] Lives with:: Spouse Patient language and need for interpreter reviewed:: Yes Do you feel safe going back to the place where you live?: Yes      Need for Family Participation in Patient Care: Yes (Comment) Care giver support system in place?: Yes (comment)   Criminal  Activity/Legal Involvement Pertinent to Current Situation/Hospitalization: No - Comment as needed  Activities of Daily Living Home Assistive Devices/Equipment: Eyeglasses, Cane (specify quad or straight), Walker (specify type) ADL Screening (condition at time of admission) Patient's cognitive ability adequate to safely complete daily activities?: Yes Is the patient deaf or have difficulty hearing?: No Does the patient have difficulty seeing, even when wearing glasses/contacts?: No Does the patient have difficulty concentrating, remembering, or making decisions?: No Patient able to express need for assistance with ADLs?: Yes Does the patient have difficulty dressing or bathing?: No Independently performs ADLs?: Yes (appropriate for developmental age) Does the patient have difficulty walking or climbing stairs?: Yes Weakness of Legs: Right Weakness of Arms/Hands: None  Permission Sought/Granted Permission sought to share information with : Chartered certified accountant granted to share information with : Yes, Verbal Permission Granted     Permission granted to share info w AGENCY: [employer]        Emotional Assessment Appearance:: Appears younger than stated age Attitude/Demeanor/Rapport: Engaged Affect (typically observed): Pleasant Orientation: : Oriented to Self, Oriented to Place, Oriented to  Time, Oriented to Situation Alcohol / Substance Use: Not Applicable Psych Involvement: No (comment)  Admission diagnosis:  Total knee replacement status [Z96.659] Patient Active Problem List   Diagnosis Date Noted  . 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 Encounter for general adult medical examination with abnormal findings 02/21/2019  .  Oral candidiasis 02/21/2019  . Acute kidney failure, unspecified ([place]) 12/09/2018  . Right knee pain 11/27/2018  . GERD (gastroesophageal reflux disease) 10/25/2018  . CKD (chronic kidney disease), stage III 10/25/2018  . CHF  (congestive heart failure) ([place]) 10/25/2018  . Pyelonephritis 10/01/2018  . Absolute anemia 10/01/2018  . Iron deficiency anemia secondary to blood loss (chronic)   . Polyp of descending colon   . Acute on chronic heart failure with preserved ejection fraction (HFpEF) ([place])   . AKI (acute kidney injury) ([place])   . Sepsis ([place]) 08/26/2018  . (HFpEF) heart failure with preserved ejection fraction ([place])   . Renal calculus, right 07/03/2018  . Low back pain 06/27/2018  . Bacterial vaginitis 06/27/2018  . Pedal edema 06/27/2018  . Coronary artery disease, non-occlusive 04/10/2018  . Acute on chronic diastolic CHF (congestive heart failure) ([employer]) 04/10/2018  . Encounter for other preprocedural examination 03/16/2018  . Community acquired pneumonia 02/16/2018  . Elevated brain natriuretic peptide (BNP) level 02/16/2018  . Abnormal renal function 02/16/2018  . Acute recurrent pansinusitis 01/13/2018  . Candidiasis 01/13/2018  . Anxiety 01/13/2018  . Osteoarthritis of knee 01/09/2018  . Morbid obesity with body mass index (BMI) of 40.0 or higher ([place]) 11/13/2017  . Major depressive disorder 09/22/2017  . Mitral valve stenosis   . Atrial fibrillation ([place]) 09/11/2017  . Generalized abdominal pain 09/08/2017  . Episode of recurrent major depressive disorder ([place]) 09/08/2017  . Hypertension 06/06/2017  . Cough 06/06/2017  . Tobacco dependency 06/06/2017  . Urinary tract infection with hematuria 04/28/2017  . Acute upper respiratory infection 04/28/2017  . Flu-like symptoms 04/28/2017  . Dysuria 04/28/2017  . Vaginal candidiasis 04/28/2017  . Pain of left lower extremity 02/11/2016  . 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 Fibrocystic breast disease 03/23/2013  . Family history of breast cancer 07/20/2012   PCP:  Ronnell Freshwater, NP Pharmacy:   [facility] [id number]  [place] [address] Phone: [phone] Fax: [phone]     Social Determinants of Health (SDOH) Interventions    Readmission Risk Interventions Readmission Risk Prevention Plan 04/10/2019  Transportation Screening Complete  Home Care Screening Complete  Medication Review (RN CM) Complete  Some recent data might be hidden

## 2019-04-10 NOTE — Consult Note (Signed)
[place] for Warfarin Indication: VTE prophylaxis  Allergies  Allergen Reactions  . Morphine Hives and Rash  . Oxycodone Hcl Hives and Itching  . Dilaudid [Hydromorphone Hcl] Itching    Patient Measurements:    Vital Signs: Temp: 98 F (36.7 C) (03/23 0529) Temp Source: Oral (03/23 0529) BP: 110/82 (03/23 0529) Pulse Rate: 82 (03/23 0529)  Labs: Recent Labs    04/09/19 1026 04/09/19 1035 04/10/19 0453  HGB  --  11.2*  --   HCT  --  33.0*  --   LABPROT 22.7*  --  22.7*  INR 2.0*  --  2.0*  CREATININE  --  1.40*  --     Estimated Creatinine Clearance: 55.9 mL/min (A) (by C-G formula based on SCr of 1.4 mg/dL (H)).   Medical History: Past Medical History:  Diagnosis Date  . (HFpEF) heart failure with preserved ejection fraction ([place])    a. 08/2017 Echo: EF 55-60%.  Grade 2 diastolic dysfunction.  Moderate mitral stenosis.  . Allergy   . Anemia   . Anxiety   . BRCA negative 03/22/2013  . Bronchitis 02/19/2016   ON LEVAQUIN PO  . Chest tightness   . Cigarette smoker 09/11/2017   8-10 day  . Dyspnea   . GERD (gastroesophageal reflux disease)   . History of kidney stones   . Hyperlipidemia   . Hypertension   . Interstitial lung disease ([place])    a. CT 2013 b. 02/2018 CXR noted recurrent intersistial changes ILD vs chronic bronchitis  . Moderate mitral stenosis    a.  08/2017 TEE: EF 60 to 65%.  Moderate mitral stenosis.  Mean gradient 14 mmHg.  Valve area 2.59 cm by planimetry, 2.72 cm by pressure half-time.  . Persistent atrial fibrillation ([place])    a. 08/2017 s/p TEE/DCCV; b. CHA2DS2VASc = 2-->warfarin.    Medications:  Medications Prior to Admission  Medication Sig Dispense Refill Last Dose  . albuterol (VENTOLIN HFA) 108 (90 Base) MCG/ACT inhaler Inhale 2 puffs into the lungs every 4 (four) hours as needed for wheezing or shortness of breath. 3 Inhaler 0 Past Week at Unknown time  . ALPRAZolam (XANAX) 0.5 MG tablet  Take 1 tablet (0.5 mg total) by mouth at bedtime as needed for anxiety. 30 tablet 2 Past Week at Unknown time  . atorvastatin (LIPITOR) 80 MG tablet Take 1 tablet (80 mg total) by mouth daily at 6 PM. (Patient taking differently: Take 80 mg by mouth at bedtime. ) 90 tablet 3 04/08/2019 at Unknown time  . diltiazem (CARDIZEM CD) 120 MG 24 hr capsule Take 1 capsule (120 mg total) by mouth daily. 90 capsule 3 04/09/2019 at Unknown time  . metoprolol succinate (TOPROL-XL) 50 MG 24 hr tablet Take 1-2 tablets (50-100 mg total) by mouth as directed. Take 1 tablet (50 mg) in the Morning & 2 tablets (100 mg) at night. (Patient taking differently: Take 50-100 mg by mouth See admin instructions. Take 1 tablet (50 mg) in the Morning & 2 tablets (100 mg) at night.) 270 tablet 3 04/09/2019 at Unknown time  . omeprazole (PRILOSEC) 40 MG capsule Take 1 capsule (40 mg total) by mouth daily. 90 capsule 1 04/09/2019 at Unknown time  . phenazopyridine (PYRIDIUM) 200 MG tablet Take 1 tablet (200 mg total) by mouth 3 (three) times daily as needed for pain. 10 tablet 0   . torsemide (DEMADEX) 20 MG tablet Take 2 tablets (40 mg total) by mouth 2 (two)  times daily. 360 tablet 3 04/08/2019 at Unknown time  . warfarin (COUMADIN) 2 MG tablet TAKE AS DIRECTED BY COUMADIN CLINIC (Patient taking differently: Take 2 mg by mouth See admin instructions. Take 1 tablet (2 mg) by mouth on _0 /22/21 1014 04/09/19 1130   04/09/19 1000  ceFAZolin (ANCEF) IVPB 2g/100 mL premix     2 g 200 mL/hr over 30 Minutes Intravenous On call to O.R. 04/09/19 0956 04/09/19 1154      Assessment: Pharmacy consulted to restart warfarin. INR is 2. CBC stable. Pt is on warfarin for afib.   Home dose: Take 1 tablet (2 mg) by mouth on Sundays, Mondays, Wednesdays, Fridays, & Saturdays. Take 0.5 tablet (1.25 mg) by mouth on Tuesdays & Thursdays.  Date INR Warfarin Dose  3/_1 mg  3/23 2                   Goal of Therapy:  INR 2-3 Monitor platelets by anticoagulation protocol: Yes   Plan:  INR is therapeutic. Will give warfarin 1.25 mg x 1 (home dose). Daily INR and CBC every 3 days while on warfarin.   Rowland Lathe,  PharmD 04/10/2019,7:51 AM

## 2019-04-11 ENCOUNTER — Inpatient Hospital Stay: Payer: Managed Care, Other (non HMO)

## 2019-04-11 LAB — PROTIME-INR
INR: 2.5 — ABNORMAL HIGH (ref 0.8–1.2)
Prothrombin Time: 27 seconds — ABNORMAL HIGH (ref 11.4–15.2)

## 2019-04-11 MED ORDER — WARFARIN 1.25 MG HALF TABLET
1.2500 mg | ORAL_TABLET | Freq: Once | ORAL | Status: DC
  Filled 2019-04-11: qty 1

## 2019-04-11 MED ORDER — OXYCODONE HCL 5 MG PO TABS: 5.0000 mg | ORAL_TABLET | ORAL | 0 refills | Status: DC | PRN

## 2019-04-11 MED ORDER — TRAMADOL HCL 50 MG PO TABS: 50.0000 mg | ORAL_TABLET | ORAL | 0 refills | Status: DC | PRN

## 2019-04-11 NOTE — Consult Note (Signed)
[place] for Warfarin Indication: VTE prophylaxis  Allergies  Allergen Reactions  . Morphine Hives and Rash  . Oxycodone Hcl Hives and Itching  . Dilaudid [Hydromorphone Hcl] Itching    Patient Measurements:    Vital Signs: Temp: 97.5 F (36.4 C) (03/24 0730) Temp Source: Oral (03/24 0730) BP: 118/60 (03/24 0730) Pulse Rate: 62 (03/24 0730)  Labs: Recent Labs    04/09/19 1026 04/09/19 1035 04/10/19 0453 04/11/19 0453  HGB  --  11.2*  --   --   HCT  --  33.0*  --   --   LABPROT 22.7*  --  22.7* 27.0*  INR 2.0*  --  2.0* 2.5*  CREATININE  --  1.40*  --   --     Estimated Creatinine Clearance: 55.9 mL/min (A) (by C-G formula based on SCr of 1.4 mg/dL (H)).   Medical History: Past Medical History:  Diagnosis Date  . (HFpEF) heart failure with preserved ejection fraction ([place])    a. 08/2017 Echo: EF 55-60%.  Grade 2 diastolic dysfunction.  Moderate mitral stenosis.  . Allergy   . Anemia   . Anxiety   . BRCA negative 03/22/2013  . Bronchitis 02/19/2016   ON LEVAQUIN PO  . Chest tightness   . Cigarette smoker 09/11/2017   8-10 day  . Dyspnea   . GERD (gastroesophageal reflux disease)   . History of kidney stones   . Hyperlipidemia   . Hypertension   . Interstitial lung disease ([place])    a. CT 2013 b. 02/2018 CXR noted recurrent intersistial changes ILD vs chronic bronchitis  . Moderate mitral stenosis    a.  08/2017 TEE: EF 60 to 65%.  Moderate mitral stenosis.  Mean gradient 14 mmHg.  Valve area 2.59 cm by planimetry, 2.72 cm by pressure half-time.  . Persistent atrial fibrillation ([place])    a. 08/2017 s/p TEE/DCCV; b. CHA2DS2VASc = 2-->warfarin.    Medications:  Medications Prior to Admission  Medication Sig Dispense Refill Last Dose  . albuterol (VENTOLIN HFA) 108 (90 Base) MCG/ACT inhaler Inhale 2 puffs into the lungs every 4 (four) hours as needed for wheezing or shortness of breath. 3 Inhaler 0 Past Week at Unknown  time  . ALPRAZolam (XANAX) 0.5 MG tablet Take 1 tablet (0.5 mg total) by mouth at bedtime as needed for anxiety. 30 tablet 2 Past Week at Unknown time  . atorvastatin (LIPITOR) 80 MG tablet Take 1 tablet (80 mg total) by mouth daily at 6 PM. (Patient taking differently: Take 80 mg by mouth at bedtime. ) 90 tablet 3 04/08/2019 at Unknown time  . diltiazem (CARDIZEM CD) 120 MG 24 hr capsule Take 1 capsule (120 mg total) by mouth daily. 90 capsule 3 04/09/2019 at Unknown time  . metoprolol succinate (TOPROL-XL) 50 MG 24 hr tablet Take 1-2 tablets (50-100 mg total) by mouth as directed. Take 1 tablet (50 mg) in the Morning & 2 tablets (100 mg) at night. (Patient taking differently: Take 50-100 mg by mouth See admin instructions. Take 1 tablet (50 mg) in the Morning & 2 tablets (100 mg) at night.) 270 tablet 3 04/09/2019 at Unknown time  . omeprazole (PRILOSEC) 40 MG capsule Take 1 capsule (40 mg total) by mouth daily. 90 capsule 1 04/09/2019 at Unknown time  . phenazopyridine (PYRIDIUM) 200 MG tablet Take 1 tablet (200 mg total) by mouth 3 (three) times daily as needed for pain. 10 tablet 0   . torsemide (DEMADEX)  20 MG tablet Take 2 tablets (40 mg total) by mouth 2 (two) times daily. 360 tablet 3 04/08/2019 at Unknown time  . warfarin (COUMADIN) 2 MG tablet TAKE AS DIRECTED BY COUMADIN CLINIC (Patient taking differently: Take 2 mg by mouth See admin instructions. Take 1 tablet (2 mg) by mouth on 'Sundays, Mondays, Wednesdays, Fridays, & Saturdays.) 30 tablet 1 04/03/2019  . warfarin (COUMADIN) 2.5 MG tablet Take 1.25 mg by mouth See admin instructions. Take 0.5 tablet (1.25 mg) by mouth on Tuesdays & Thursdays.   04/03/2019  . potassium chloride (KLOR-CON) 10 MEQ tablet Take 20 mEq by mouth in the morning and at bedtime.   Not Taking at Unknown time   Scheduled:  . atorvastatin  80 mg Oral QHS  . diltiazem  120 mg Oral Daily  . ferrous sulfate  325 mg Oral BID WC  . gabapentin  300 mg Oral QHS  . magnesium  hydroxide  30 mL Oral Daily  . metoCLOPramide  10 mg Oral TID AC & HS  . metoprolol succinate  100 mg Oral QHS  . metoprolol succinate  50 mg Oral Daily  . pantoprazole  40 mg Oral BID  . potassium chloride  20 mEq Oral BID  . senna-docusate  1 tablet Oral BID  . torsemide  40 mg Oral BID  . Warfarin - Pharmacist Dosing Inpatient   Does not apply q1800   Infusions:  . sodium chloride 75 mL/hr at 04/10/19 0500   PRN: acetaminophen, albuterol, ALPRAZolam, alum & mag hydroxide-simeth, bisacodyl, diphenhydrAMINE, HYDROmorphone (DILAUDID) injection, menthol-cetylpyridinium **OR** phenol, metoCLOPramide **OR** metoCLOPramide (REGLAN) injection, ondansetron **OR** ondansetron (ZOFRAN) IV, oxyCODONE, oxyCODONE, phenazopyridine, sodium phosphate, traMADol Anti-infectives (From admission, onward)   Start     Dose/Rate Route Frequency Ordered Stop   04/09/19 1800  ceFAZolin (ANCEF) IVPB 2g/100 mL premix     2 g 200 mL/hr over 30 Minutes Intravenous Every 6 hours 04/09/19 1728 04/10/19 1303   04/09/19 1014  ceFAZolin (ANCEF) 2-4 GM/100ML-% IVPB    Note to Pharmacy: Moore, Jordan   : cabinet override      03' /22/21 1014 04/09/19 1130   04/09/19 1000  ceFAZolin (ANCEF) IVPB 2g/100 mL premix     2 g 200 mL/hr over 30 Minutes Intravenous On call to O.R. 04/09/19 0956 04/09/19 1154      Assessment: Pharmacy consulted to restart warfarin. INR is 2. CBC stable. Pt is on warfarin for afib.   Home dose: Take 1 tablet (2 mg) by mouth on Sundays, Mondays, Wednesdays, Fridays, & Saturdays. Take 0.5 tablet (1.25 mg) by mouth on Tuesdays & Thursdays.  Date INR Warfarin Dose  3/'22 2 2 ' mg  3/23 2 1.25 mg  3/24 2.5               Goal of Therapy:  INR 2-3 Monitor platelets by anticoagulation protocol: Yes   Plan:  INR therapeutic. With bump in INR, will repeat dose of 1.25 mg again today. INR daily.  Tawnya Crook, PharmD 04/11/2019,7:43 AM

## 2019-04-11 NOTE — Progress Notes (Signed)
Physical Therapy Treatment Patient Details Name: Leslie [name] MRN: [id number] DOB: 10-Jan-1966 Today's Date: 04/11/2019    History of Present Illness Pt is a 54 y.o. female s/p R TKA secondary degenerative arthrosis 04/09/19.  PMH includes anxiety, a-fib, htn, sepsis, CAD, CHF, and L TKA 12/25/14 (Dr. Mack Guise).    PT Comments    Pt sitting in regular chair upon PT arrival.  SBA with transfers and ambulation 200 feet with RW; also able to safely ascend/descend 4 steps with RW with pt's husbands assist.  O2 sats 94% or greater on room air during sessions activities.  Reviewed car transfers: pt and pt's husband verbalizing appropriate understanding.  Pt's walker for home use fit to pt.  Pt and pt's husband report no questions/concerns for discharge home.  Pt appears safe to discharge home with support of husband when medically appropriate.    Follow Up Recommendations  Follow surgeon's recommendation for DC plan and follow-up therapies     Equipment Recommendations  Rolling walker with 5" wheels;3in1 (PT)    Recommendations for Other Services       Precautions / Restrictions Precautions Precautions: Knee;Fall Precaution Booklet Issued: Yes (comment) Restrictions Weight Bearing Restrictions: Yes RLE Weight Bearing: Weight bearing as tolerated    Mobility  Bed Mobility               General bed mobility comments: Deferred (pt sitting in chair beginning/end of session)  Transfers Overall transfer level: Needs assistance Equipment used: Rolling walker (2 wheeled) Transfers: Sit to/from Stand Sit to Stand: Supervision Stand pivot transfers: Supervision       General transfer comment: steady safe transfers noted with RW use  Ambulation/Gait Ambulation/Gait assistance: Supervision Gait Distance (Feet): (40 feet; 200 feet) Assistive device: Rolling walker (2 wheeled)   Gait velocity: decreased   General Gait Details: antalgic; vc's for upright/more forward posture (d/t  pt initially standing with shoulders/head more posterior); pt initially with increased L lateral sway but improved with vc's for gait technique   Stairs Stairs: Yes Stairs assistance: Min guard Stair Management: Step to pattern;Backwards;With walker Number of Stairs: 4 General stair comments: steady with RW use and pt's husband assisting pt   Wheelchair Mobility    Modified Rankin (Stroke Patients Only)       Balance Overall balance assessment: Needs assistance Sitting-balance support: No upper extremity supported;Feet supported Sitting balance-Leahy Scale: Good Sitting balance - Comments: steady sitting reaching outside BOS   Standing balance support: No upper extremity supported Standing balance-Leahy Scale: Good Standing balance comment: steady standing reaching within BOS                            Cognition Arousal/Alertness: Awake/alert Behavior During Therapy: WFL for tasks assessed/performed Overall Cognitive Status: Within Functional Limits for tasks assessed                                        Exercises     General Comments        Pertinent Vitals/Pain Pain Assessment: 0-10 Pain Score: 6  Pain Location: R knee/surgical site Pain Descriptors / Indicators: Aching;Discomfort;Sore Pain Intervention(s): Limited activity within patient's tolerance;Monitored during session;Premedicated before session;Repositioned;Other (comment)(polar care applied and activated)  HR increased to 120 bpm with activity but decreased to around 92 bpm at rest.    Home Living  Prior Function            PT Goals (current goals can now be found in the care plan section) Acute Rehab PT Goals Patient Stated Goal: to improve pain and mobility PT Goal Formulation: With patient Time For Goal Achievement: 04/24/19 Potential to Achieve Goals: Good Progress towards PT goals: Progressing toward goals    Frequency     BID      PT Plan Current plan remains appropriate    Co-evaluation              AM-PAC PT "6 Clicks" Mobility   Outcome Measure  Help needed turning from your back to your side while in a flat bed without using bedrails?: None Help needed moving from lying on your back to sitting on the side of a flat bed without using bedrails?: None Help needed moving to and from a bed to a chair (including a wheelchair)?: A Little Help needed standing up from a chair using your arms (e.g., wheelchair or bedside chair)?: A Little Help needed to walk in hospital room?: A Little Help needed climbing 3-5 steps with a railing? : A Little 6 Click Score: 20    End of Session Equipment Utilized During Treatment: Gait belt Activity Tolerance: Patient tolerated treatment well Patient left: in chair;with family/visitor present;with SCD's reapplied;with call bell/phone within reach Nurse Communication: Mobility status;Precautions;Weight bearing status PT Visit Diagnosis: Other abnormalities of gait and mobility (R26.89);Muscle weakness (generalized) (M62.81);Difficulty in walking, not elsewhere classified (R26.2);Pain Pain - Right/Left: Right Pain - part of body: Knee     Time: 1350-1413 PT Time Calculation (min) (ACUTE ONLY): 23 min  Charges:  $Gait Training: 23-37 mins                     Leitha Bleak, PT 04/11/19, 5:18 PM

## 2019-04-11 NOTE — Progress Notes (Signed)
Occupational Therapy Treatment Patient Details Name: Leslie [name] MRN: [id number] DOB: May 20, 1965 Today's Date: 04/11/2019    History of present illness Pt is a 54 y.o. female s/p R TKA secondary degenerative arthrosis 04/09/19.  PMH includes anxiety, a-fib, htn, sepsis, CAD, CHF, and L TKA 12/25/14 (Dr. Mack Guise).   OT comments  Patient sitting up in chair upon entry this date. Husband present in recliner.  Patient with plans to discharge this date.  Provided overview on use of AE/DME and compensatory techniques to safely perform self care and functional transfers/mobility.  Patient plans to have railing put up at home by entrance stairs this weekend.  Patient and family with no further questions.  D/C plan continues to be outpatient therapy.  Patient and husband agreeable to this.   Follow Up Recommendations  Outpatient OT;Other (comment)(Insurance will only accept OP)    Equipment Recommendations  3 in 1 bedside commode;Other (comment);Tub/shower seat(2WW)    Recommendations for Other Services      Precautions / Restrictions Precautions Precautions: Knee;Fall Precaution Booklet Issued: Yes (comment) Restrictions Weight Bearing Restrictions: Yes RLE Weight Bearing: Weight bearing as tolerated       Mobility Bed Mobility     Transfers Overall transfer level: Needs assistance Equipment used: Rolling walker (2 wheeled) Transfers: Sit to/from Stand;Stand Pivot Transfers Sit to Stand: Min guard;Supervision Stand pivot transfers: Min guard;Supervision       General transfer comment: Demonstrating improved body mechanics and sequencing with tasks    Balance                            ADL either performed or assessed with clinical judgement   ADL Overall ADL's : Needs assistance/impaired                     Lower Body Dressing: Minimal assistance;Sit to/from stand;Cueing for compensatory techniques;Cueing for sequencing;Cueing for safety;With  adaptive equipment   Toilet Transfer: Min guard;RW   Toileting- Water quality scientist and Hygiene: Min guard;Sit to/from stand       Functional mobility during ADLs: Min guard;Supervision/safety;Rolling walker General ADL Comments: Demonstrating overall improvement in ability to participate within pain tolerance and with use of compensatory techniques     Vision Patient Visual Report: No change from baseline     Perception     Praxis      Cognition Arousal/Alertness: Awake/alert Behavior During Therapy: WFL for tasks assessed/performed Overall Cognitive Status: Within Functional Limits for tasks assessed                                          Exercises  Other Exercises Other Exercises: Discussed discharge plans and addressed patient questions Other Exercises: Provided education on use of compensatory techniques and AE to perform self care, functional transfers and bed mobility   Shoulder Instructions       General Comments      Pertinent Vitals/ Pain       Pain Assessment: Faces Pain Score: 3  Pain Location: R knee/surgical site Pain Descriptors / Indicators: Aching;Discomfort;Sore Pain Intervention(s): Limited activity within patient's tolerance;Monitored during session  Home Living  Prior Functioning/Environment              Frequency  Min 3X/week        Progress Toward Goals  OT Goals(current goals can now be found in the care plan section)  Progress towards OT goals: Progressing toward goals  Acute Rehab OT Goals Patient Stated Goal: to improve pain and mobility  Plan Discharge plan remains appropriate;Frequency remains appropriate    Co-evaluation                 AM-PAC OT "6 Clicks" Daily Activity     Outcome Measure   Help from another person eating meals?: None Help from another person taking care of personal grooming?: None Help from another person  toileting, which includes using toliet, bedpan, or urinal?: A Lot Help from another person bathing (including washing, rinsing, drying)?: A Lot Help from another person to put on and taking off regular upper body clothing?: None Help from another person to put on and taking off regular lower body clothing?: A Lot 6 Click Score: 18    End of Session    OT Visit Diagnosis: Unsteadiness on feet (R26.81);Other (comment)(R TKA)   Activity Tolerance Patient tolerated treatment well   Patient Left in chair;with call bell/phone within reach;with family/visitor present   Nurse Communication          Time: [id number] OT Time Calculation (min): 9 min  Charges: OT General Charges $OT Visit: 1 Visit OT Treatments $Self Care/Home Management : 8-22 mins  Baldomero Lamy, MS, OTR/L 04/11/19, 1:59 PM

## 2019-04-11 NOTE — TOC Transition Note (Signed)
Transition of Care [facility]) - CM/SW Discharge Note   Patient Details  Name: Leslie [name] MRN: [id number] Date of Birth: 06/03/1965  [facility] CM/SW Contact:  Elease Hashimoto, LCSW Phone Number: 04/11/2019, 10:42 AM   Clinical Narrative:  Met with pt and husband who is in the room and both ready for discharge today. Aware will need to go to OPPT and husband or a friend can transport to appointments. She reports some wheezing last night and has had to use O2, but not on prior to admission. Equipment in the room for home.     Final next level of care: Home/Self Care Barriers to Discharge: Barriers Resolved   Patient Goals and CMS Choice Patient states their goals for this hospitalization and ongoing recovery are:: return home CMS Medicare.gov Compare Post Acute Care list provided to:: Patient Choice offered to / list presented to : Patient  Discharge Placement                Patient to be transferred to facility by: Husband via car Name of family member notified: husband Patient and family notified of of transfer: 04/11/19  Discharge Plan and Services In-house Referral: Clinical Social Work   Post Acute Care Choice: Durable Medical Equipment          DME Arranged: Gilford Rile rolling, 3-N-1 DME Agency: [employer] Date DME Agency Contacted: 04/10/19   Representative spoke with at DME Agency: Leroy Sea            Social Determinants of Health ([place]) Interventions     Readmission Risk Interventions Readmission Risk Prevention Plan 04/10/2019  Transportation Screening Complete  Home Care Screening Complete  Medication Review (RN CM) Complete  Some recent data might be hidden

## 2019-04-11 NOTE — Discharge Summary (Signed)
Physician Discharge Summary  Patient ID: Leslie [name] MRN: [id number] DOB/AGE: August 03, 1965 54 y.o.  Admit date: 04/09/2019 Discharge date: 04/11/2019  Admission Diagnoses:  Total knee replacement status [Z96.659]  Surgeries:Procedure(s):  Right total knee arthroplasty using computer-assisted navigation  SURGEON:  Marciano Sequin. M.D.  ASSISTANT: Cassell Smiles, PA-C (present and scrubbed throughout the case, critical for assistance with exposure, retraction, instrumentation, and closure)  ANESTHESIA: general  ESTIMATED BLOOD LOSS: 50 mL  FLUIDS REPLACED: 1600 mL of crystalloid  TOURNIQUET TIME: 96 minutes  DRAINS: 2 medium Hemovac drains  SOFT TISSUE RELEASES: Anterior cruciate ligament, posterior cruciate ligament, deep medial collateral ligament, patellofemoral ligament  IMPLANTS UTILIZED: DePuy Attune size 4N posterior stabilized femoral component (cemented), size 4 rotating platform tibial component (cemented), 35 mm medialized dome patella (cemented), and a 7 mm stabilized rotating platform polyethylene insert.  Discharge Diagnoses: Patient Active Problem List   Diagnosis Date Noted  . 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 Encounter for general adult medical examination with abnormal findings 02/21/2019  . Oral candidiasis 02/21/2019  . Acute kidney failure, unspecified ([place]) 12/09/2018  . Right knee pain 11/27/2018  . GERD (gastroesophageal reflux disease) 10/25/2018  . CKD (chronic kidney disease), stage III 10/25/2018  . CHF (congestive heart failure) ([place]) 10/25/2018  . Pyelonephritis 10/01/2018  . Absolute anemia 10/01/2018  . Iron deficiency anemia secondary to blood loss (chronic)   . Polyp of descending colon   . Acute on chronic heart failure with preserved ejection fraction (HFpEF) ([place])   . AKI (acute kidney injury) ([place])   . Sepsis ([place]) 08/26/2018  . (HFpEF) heart failure with preserved ejection fraction ([place])   . Renal calculus, right  07/03/2018  . Low back pain 06/27/2018  . Bacterial vaginitis 06/27/2018  . Pedal edema 06/27/2018  . Coronary artery disease, non-occlusive 04/10/2018  . Acute on chronic diastolic CHF (congestive heart failure) ([place]) 04/10/2018  . Encounter for other preprocedural examination 03/16/2018  . Community acquired pneumonia 02/16/2018  . Elevated brain natriuretic peptide (BNP) level 02/16/2018  . Abnormal renal function 02/16/2018  . Acute recurrent pansinusitis 01/13/2018  . Candidiasis 01/13/2018  . Anxiety 01/13/2018  . Osteoarthritis of knee 01/09/2018  . Morbid obesity with body mass index (BMI) of 40.0 or higher ([place]) 11/13/2017  . Major depressive disorder 09/22/2017  . Mitral valve stenosis   . Atrial fibrillation ([place]) 09/11/2017  . Generalized abdominal pain 09/08/2017  . Episode of recurrent major depressive disorder ([place]) 09/08/2017  . Hypertension 06/06/2017  . Cough 06/06/2017  . Tobacco dependency 06/06/2017  . Urinary tract infection with hematuria 04/28/2017  . Acute upper respiratory infection 04/28/2017  . Flu-like symptoms 04/28/2017  . Dysuria 04/28/2017  . Vaginal candidiasis 04/28/2017  . Pain of left lower extremity 02/11/2016  . 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 Fibrocystic breast disease 03/23/2013  . Family history of breast cancer 07/20/2012    Past Medical History:  Diagnosis Date  . (HFpEF) heart failure with preserved ejection fraction ([place])    a. 08/2017 Echo: EF 55-60%.  Grade 2 diastolic dysfunction.  Moderate mitral stenosis.  . Allergy   . Anemia   . Anxiety   . BRCA negative 03/22/2013  . Bronchitis 02/19/2016   ON LEVAQUIN PO  . Chest tightness   . Cigarette smoker 09/11/2017   8-10 day  . Dyspnea   . GERD (gastroesophageal reflux disease)   . History of kidney stones   . Hyperlipidemia   . Hypertension   .  Interstitial lung disease ([place])    a. CT 2013 b. 02/2018 CXR noted recurrent intersistial changes ILD vs  chronic bronchitis  . Moderate mitral stenosis    a.  08/2017 TEE: EF 60 to 65%.  Moderate mitral stenosis.  Mean gradient 14 mmHg.  Valve area 2.59 cm by planimetry, 2.72 cm by pressure half-time.  . Persistent atrial fibrillation ([place])    a. 08/2017 s/p TEE/DCCV; b. CHA2DS2VASc = 2-->warfarin.     Transfusion:    Consultants (if any):   Discharged Condition: Improved  Hospital Course: Leslie [name] is an 54 y.o. female who was admitted 04/09/2019 with a diagnosis of right knee osteoarthritis and went to the operating room on 04/09/2019 and underwent right total knee arthroplasty. The patient received perioperative antibiotics for prophylaxis (see below). The patient tolerated the procedure well and was transported to PACU in stable condition. After meeting PACU criteria, the patient was subsequently transferred to the Orthopaedics/Rehabilitation unit.   The patient received DVT prophylaxis in the form of early mobilization, Coumadin, Foot Pumps and TED hose. A sacral pad had been placed and heels were elevated off of the bed with rolled towels in order to protect skin integrity. Foley catheter was discontinued on postoperative day #0. Wound drains were discontinued on postoperative day #2. The surgical incision was healing well without signs of infection.  Physical therapy was initiated postoperatively for transfers, gait training, and strengthening. Occupational therapy was initiated for activities of daily living and evaluation for assisted devices. Rehabilitation goals were reviewed in detail with the patient. The patient made steady progress with physical therapy and physical therapy recommended discharge to Home.   The patient achieved her preliminary goals of this hospitalization and was felt to be medically and orthopaedically appropriate for discharge.  She was given perioperative antibiotics:  Anti-infectives (From admission, onward)   Start     Dose/Rate Route Frequency Ordered Stop     04/09/19 1800  ceFAZolin (ANCEF) IVPB 2g/100 mL premix     2 g 200 mL/hr over 30 Minutes Intravenous Every 6 hours 04/09/19 1728 04/10/19 1303   04/09/19 1014  ceFAZolin (ANCEF) 2-4 GM/100ML-% IVPB    Note to Pharmacy: [place], [place]   : cabinet override      04/09/19 1014 04/09/19 1130   04/09/19 1000  ceFAZolin (ANCEF) IVPB 2g/100 mL premix     2 g 200 mL/hr over 30 Minutes Intravenous On call to O.R. 04/09/19 6283 04/09/19 1154    .  Recent vital signs:  Vitals:   04/11/19 0730 04/11/19 1038  BP: 118/60 129/64  Pulse: 62 83  Resp:  14  Temp: (!) 97.5 F (36.4 C) 98 F (36.7 C)  SpO2:  97%    Recent laboratory studies:  Recent Labs    04/09/19 1026 04/09/19 1035 04/09/19 1655 04/10/19 0453 04/11/19 0453  HGB  --  11.2*  --   --   --   HCT  --  33.0*  --   --   --   K  --  3.6 4.7  --   --   CL  --  99  --   --   --   BUN  --  18  --   --   --   CREATININE  --  1.40*  --   --   --   GLUCOSE  --  88  --   --   --   INR   < >  --   --  2.0* 2.5*   < > = values in this interval not displayed.    Diagnostic Studies: DG Knee Right Port  Result Date: 04/09/2019 CLINICAL DATA:  Computer-assisted total knee arthroplasty. EXAM: PORTABLE RIGHT KNEE - 1-2 VIEW COMPARISON:  Radiographs 11/26/2018. FINDINGS: Status post interval right total knee arthroplasty. The hardware is well positioned. There are pin tracks within the distal femoral and proximal tibial diaphyses. No evidence of acute fracture or dislocation. A surgical drain is in place. Mild gas is present within the joint and anterior soft tissues. Anterior skin staples are present. IMPRESSION: Interval right total knee arthroplasty without demonstrated complication. Electronically Signed   By: Richardean Sale M.D.   On: 04/09/2019 15:31    Discharge Medications:   Allergies as of 04/11/2019      Reactions   Morphine Hives, Rash   Oxycodone Hcl Hives, Itching   Dilaudid [hydromorphone Hcl] Itching      Medication  List    TAKE these medications   albuterol 108 (90 Base) MCG/ACT inhaler Commonly known as: VENTOLIN HFA Inhale 2 puffs into the lungs every 4 (four) hours as needed for wheezing or shortness of breath.   ALPRAZolam 0.5 MG tablet Commonly known as: XANAX Take 1 tablet (0.5 mg total) by mouth at bedtime as needed for anxiety.   atorvastatin 80 MG tablet Commonly known as: LIPITOR Take 1 tablet (80 mg total) by mouth daily at 6 PM. What changed: when to take this   diltiazem 120 MG 24 hr capsule Commonly known as: CARDIZEM CD Take 1 capsule (120 mg total) by mouth daily.   metoprolol succinate 50 MG 24 hr tablet Commonly known as: TOPROL-XL Take 1-2 tablets (50-100 mg total) by mouth as directed. Take 1 tablet (50 mg) in the Morning & 2 tablets (100 mg) at night. What changed: when to take this   omeprazole 40 MG capsule Commonly known as: PRILOSEC Take 1 capsule (40 mg total) by mouth daily.   oxyCODONE 5 MG immediate release tablet Commonly known as: Oxy IR/ROXICODONE Take 1 tablet (5 mg total) by mouth every 4 (four) hours as needed for moderate pain (pain score 4-6).   phenazopyridine 200 MG tablet Commonly known as: PYRIDIUM Take 1 tablet (200 mg total) by mouth 3 (three) times daily as needed for pain.   potassium chloride 10 MEQ tablet Commonly known as: KLOR-CON Take 20 mEq by mouth in the morning and at bedtime.   torsemide 20 MG tablet Commonly known as: DEMADEX Take 2 tablets (40 mg total) by mouth 2 (two) times daily.   traMADol 50 MG tablet Commonly known as: ULTRAM Take 1 tablet (50 mg total) by mouth every 4 (four) hours as needed for moderate pain.   warfarin 2.5 MG tablet Commonly known as: COUMADIN Take as directed. If you are unsure how to take this medication, talk to your nurse or doctor. Original instructions: Take 1.25 mg by mouth See admin instructions. Take 0.5 tablet (1.25 mg) by mouth on Tuesdays & Thursdays. What changed: Another  medication with the same name was changed. Make sure you understand how and when to take each.   warfarin 2 MG tablet Commonly known as: COUMADIN Take as directed. If you are unsure how to take this medication, talk to your nurse or doctor. Original instructions: TAKE AS DIRECTED BY COUMADIN CLINIC What changed:   how much to take  how to take this  when to take this  additional instructions  Durable Medical Equipment  (From admission, onward)         Start     Ordered   04/09/19 1729  DME Walker rolling  Once    Question:  Patient needs a walker to treat with the following condition  Answer:  Total knee replacement status   04/09/19 1728   04/09/19 1729  DME Bedside commode  Once    Question:  Patient needs a bedside commode to treat with the following condition  Answer:  Total knee replacement status   04/09/19 1728          Disposition: Home with outpatient physical therapy to start this week    Follow-up Information    Fausto Skillern, PA-C On 04/24/2019.   Specialty: Orthopedic Surgery Why: at 1:15pm Contact information: [place] [address] [phone]        Dereck Leep, MD On 05/22/2019.   Specialty: Orthopedic Surgery Why: at 2:30pm Contact information: [place] [id number] [place], PA-C 04/11/2019, 11:49 AM

## 2019-04-11 NOTE — Progress Notes (Signed)
  Subjective: 2 Days Post-Op Procedure(s) (LRB): COMPUTER ASSISTED TOTAL KNEE ARTHROPLASTY (Right) Patient reports pain as well-controlled.   Patient is well, but has had some minor complaints of thigh soreness. Plan is to go Home after hospital stay. Negative for chest pain and shortness of breath Fever: no Gastrointestinal: negative for nausea and vomiting.  Patient has not had a bowel movement.  Objective: Vital signs in last 24 hours: Temp:  [97.3 F (36.3 C)-98.5 F (36.9 C)] 98.2 F (36.8 C) (03/24 0443) Pulse Rate:  [59-74] 74 (03/24 0443) Resp:  [15-17] 16 (03/24 0443) BP: (108-140)/(61-119) 136/64 (03/24 0443) SpO2:  [93 %-100 %] 96 % (03/24 0443)  Intake/Output from previous day:  Intake/Output Summary (Last 24 hours) at 04/11/2019 0659 Last data filed at 04/11/2019 0505 Gross per 24 hour  Intake 811.36 ml  Output 2275 ml  Net -1463.64 ml    Intake/Output this shift: Total I/O In: 240 [P.O.:240] Out: 1675 [Urine:1650; Drains:25]  Labs: Recent Labs    04/09/19 1035  HGB 11.2*   Recent Labs    04/09/19 1035  HCT 33.0*   Recent Labs    04/09/19 1035 04/09/19 1655  NA 139  --   K 3.6 4.7  CL 99  --   BUN 18  --   CREATININE 1.40*  --   GLUCOSE 88  --    Recent Labs    04/10/19 0453 04/11/19 0453  INR 2.0* 2.5*     EXAM General - Patient is Alert, Appropriate and Oriented Extremity - Neurovascular intact Dorsiflexion/Plantar flexion intact Compartment soft Dressing/Incision -Postoperative dressing remains in place., Polar Care in place and working. , Hemovac in place.  Motor Function - intact, moving foot and toes well on exam.  Cardiovascular- Regular rate and rhythm, no murmurs/rubs/gallops Respiratory- Some expiratory wheezing heard bilaterally, otherwise clear to auscultation. Gastrointestinal- soft, nontender and active bowel sounds   Assessment/Plan: 2 Days Post-Op Procedure(s) (LRB): COMPUTER ASSISTED TOTAL KNEE ARTHROPLASTY  (Right) Active Problems:   Total knee replacement status  Estimated body mass index is 40.17 kg/m as calculated from the following:   Height as of 03/29/19: 5\' 4"  (1.626 m).   Weight as of 03/29/19: 106.1 kg. Advance diet Up with therapy Discharge home with home health if patient completes PT goals and has BM.  Albuterol treatment ordered. Hemovac drain removed.  Fresh honeycomb dressing applied.  DVT Prophylaxis - Coumadin Weight-Bearing as tolerated to right leg  Cassell Smiles, PA-C [facility] Orthopaedic Surgery 04/11/2019, 6:59 AM

## 2019-04-11 NOTE — Progress Notes (Signed)
Physical Therapy Treatment Patient Details Name: Leslie [name] MRN: [id number] DOB: 1966/01/05 Today's Date: 04/11/2019    History of Present Illness Pt is a 54 y.o. female s/p R TKA secondary degenerative arthrosis 04/09/19.  PMH includes anxiety, a-fib, htn, sepsis, CAD, CHF, and L TKA 12/25/14 (Dr. Mack Guise).    PT Comments    Pt resting in bed upon PT arrival; pt's husband present.  Performed HEP education with pt and pt's husband (with pt's husband assisting pt with TKR HEP ex's in bed: pt and pt's husband verbalizing and demonstrating good understanding).  Pt modified independent with bed mobility and CGA to SBA with transfers (multiple different sitting surfaces during session).  Able to progress to ambulating 150 feet with RW CGA to SBA and also navigate 4 steps with RW CGA x2.  HR 85 bpm at rest but increased to 125 bpm after stairs and ambulation (HR decreased to 94 bpm within a couple minutes of sitting rest).  O2 sats 89-90% post stairs and ambulation on room air (O2 94% on room air end of session resting in recliner).  R knee AROM 0-95 degrees.  Pain 5/10 R knee resting in recliner end of session.  Pt requesting to trial stairs again this afternoon: nurse notified.   Follow Up Recommendations  Follow surgeon's recommendation for DC plan and follow-up therapies     Equipment Recommendations  Rolling walker with 5" wheels;3in1 (PT)    Recommendations for Other Services       Precautions / Restrictions Precautions Precautions: Knee;Fall Precaution Booklet Issued: Yes (comment) Restrictions Weight Bearing Restrictions: Yes RLE Weight Bearing: Weight bearing as tolerated    Mobility  Bed Mobility Overal bed mobility: Needs Assistance Bed Mobility: Supine to Sit;Sit to Supine     Supine to sit: Modified independent (Device/Increase time) Sit to supine: Modified independent (Device/Increase time)   General bed mobility comments: bed flat; pt using L LE under R LE to assist  bringing R LE into bed  Transfers Overall transfer level: Needs assistance Equipment used: Rolling walker (2 wheeled) Transfers: Sit to/from Stand Sit to Stand: Min guard;Supervision         General transfer comment: occasional vc's for technique; Sit to stand with RW use: x1 trial from bed, x1 trial from [facility] over toilet, x1 trial from mat table, x1 trial from recliner  Ambulation/Gait Ambulation/Gait assistance: Min guard;Supervision Gait Distance (Feet): (25 feet to bathroom; approximately 30 feet and then 40 feet for stairs training; 150 feet) Assistive device: Rolling walker (2 wheeled)   Gait velocity: decreased   General Gait Details: antalgic; decreased stance time R LE; initial vc's for stepping technique including R heel strike during R LE advancement and R quad set during R LE stance phase   Stairs Stairs: Yes Stairs assistance: Min guard;+2 safety/equipment Stair Management: Step to pattern;Backwards;With walker Number of Stairs: 4 General stair comments: initial vc's and demo for technique; steady with RW use   Wheelchair Mobility    Modified Rankin (Stroke Patients Only)       Balance Overall balance assessment: Needs assistance Sitting-balance support: No upper extremity supported;Feet supported Sitting balance-Leahy Scale: Good Sitting balance - Comments: steady sitting reaching outside BOS   Standing balance support: No upper extremity supported Standing balance-Leahy Scale: Good Standing balance comment: steady standing washing hands at sink                            Cognition Arousal/Alertness: Awake/alert Behavior  During Therapy: WFL for tasks assessed/performed Overall Cognitive Status: Within Functional Limits for tasks assessed                                        Exercises Total Joint Exercises Ankle Circles/Pumps: AROM;Strengthening;Both;10 reps;Supine Quad Sets: AROM;Strengthening;Both;10  reps;Supine Short Arc Quad: AAROM;Strengthening;Right;10 reps;Supine Heel Slides: AAROM;Strengthening;Right;10 reps;Supine Hip ABduction/ADduction: AAROM;Strengthening;Right;10 reps;Supine Straight Leg Raises: AAROM;Strengthening;Right;10 reps;Supine Goniometric ROM: R knee AROM 0-95 degrees sitting in recliner    General Comments Pt agreeable to PT session.      Pertinent Vitals/Pain Pain Assessment: Faces Pain Score: 5  Pain Location: R knee/surgical site Pain Descriptors / Indicators: Aching;Discomfort;Sore Pain Intervention(s): Limited activity within patient's tolerance;Monitored during session;Premedicated before session;Repositioned;Other (comment)(polar care applied and activated)    Home Living                      Prior Function            PT Goals (current goals can now be found in the care plan section) Acute Rehab PT Goals Patient Stated Goal: to improve pain and mobility PT Goal Formulation: With patient Time For Goal Achievement: 04/24/19 Potential to Achieve Goals: Good Progress towards PT goals: Progressing toward goals    Frequency    BID      PT Plan Current plan remains appropriate    Co-evaluation              AM-PAC PT "6 Clicks" Mobility   Outcome Measure  Help needed turning from your back to your side while in a flat bed without using bedrails?: None Help needed moving from lying on your back to sitting on the side of a flat bed without using bedrails?: None Help needed moving to and from a bed to a chair (including a wheelchair)?: A Little Help needed standing up from a chair using your arms (e.g., wheelchair or bedside chair)?: A Little Help needed to walk in hospital room?: A Little Help needed climbing 3-5 steps with a railing? : A Little 6 Click Score: 20    End of Session Equipment Utilized During Treatment: Gait belt Activity Tolerance: Patient tolerated treatment well Patient left: in chair;with call bell/phone  within reach;with chair alarm set;with family/visitor present;with SCD's reapplied;Other (comment)(B heels floating via towel rolls; polar care in place and activated) Nurse Communication: Mobility status;Precautions;Weight bearing status PT Visit Diagnosis: Other abnormalities of gait and mobility (R26.89);Muscle weakness (generalized) (M62.81);Difficulty in walking, not elsewhere classified (R26.2);Pain Pain - Right/Left: Right Pain - part of body: Knee     Time: 0908-1020 PT Time Calculation (min) (ACUTE ONLY): 72 min  Charges:  $Gait Training: 23-37 mins $Therapeutic Exercise: 23-37 mins $Therapeutic Activity: 8-22 mins                     Leitha Bleak, PT 04/11/19, 12:01 PM

## 2019-04-13 ENCOUNTER — Encounter: Payer: Self-pay | Admitting: Nurse Practitioner

## 2019-04-13 ENCOUNTER — Other Ambulatory Visit: Payer: Self-pay

## 2019-04-13 ENCOUNTER — Ambulatory Visit: Payer: Managed Care, Other (non HMO) | Admitting: Nurse Practitioner

## 2019-04-13 VITALS — Temp 98.7°F | Ht 64.0 in | Wt 234.0 lb

## 2019-04-13 DIAGNOSIS — B373 Candidiasis of vulva and vagina: Secondary | ICD-10-CM | POA: Diagnosis not present

## 2019-04-13 DIAGNOSIS — B3731 Acute candidiasis of vulva and vagina: Secondary | ICD-10-CM

## 2019-04-13 DIAGNOSIS — R05 Cough: Secondary | ICD-10-CM

## 2019-04-13 DIAGNOSIS — J069 Acute upper respiratory infection, unspecified: Secondary | ICD-10-CM | POA: Diagnosis not present

## 2019-04-13 DIAGNOSIS — R059 Cough, unspecified: Secondary | ICD-10-CM

## 2019-04-13 MED ORDER — FLUCONAZOLE 150 MG PO TABS: ORAL_TABLET | ORAL | 0 refills | Status: DC

## 2019-04-13 MED ORDER — LEVOFLOXACIN 500 MG PO TABS: 500.0000 mg | ORAL_TABLET | Freq: Every day | ORAL | 0 refills | Status: DC

## 2019-04-13 NOTE — Progress Notes (Signed)
[facility] [place], [place] [address]  Internal MEDICINE  Telephone Visit  Patient Name: Leslie [name]  [id number]  [id number]  Date of Service: 04/13/2019  I connected with the patient at 8:40am by telephone and verified the patients identity using two identifiers.   I discussed the limitations, risks, security and privacy concerns of performing an evaluation and management service by telephone and the availability of in person appointments. I also discussed with the patient that there may be a patient responsible charge related to the service.  The patient expressed understanding and agrees to proceed.    Chief Complaint  Patient presents with  . Telephone Assessment  . Telephone Screen  . Sinusitis    going on for 3 days   . Cough  . Wheezing  . Headache    The patient has been contacted via telephone for follow up visit due to concerns for spread of novel coronavirus. The patient presents for acute visit. She states that she has some nasal congestion along with wheezing, cough,  She states that the symptoms have been going on for about 4 days. She denies fever, chills, or headache. She did have knee replacement surgery on 04/09/2019. Her symptoms did start while she was still in the hospital. Chest x-ray was done and was negative for any active cardiopulmonary disease.       Current Medication: Outpatient Encounter Medications as of 04/13/2019  Medication Sig  . albuterol (VENTOLIN HFA) 108 (90 Base) MCG/ACT inhaler Inhale 2 puffs into the lungs every 4 (four) hours as needed for wheezing or shortness of breath.  . ALPRAZolam (XANAX) 0.5 MG tablet Take 1 tablet (0.5 mg total) by mouth at bedtime as needed for anxiety.  Marland Kitchen atorvastatin (LIPITOR) 80 MG tablet Take 1 tablet (80 mg total) by mouth daily at 6 PM. (Patient taking differently: Take 80 mg by mouth at bedtime. )  . diltiazem (CARDIZEM CD) 120 MG 24 hr capsule Take 1 capsule (120 mg total) by  mouth daily.  . metoprolol succinate (TOPROL-XL) 50 MG 24 hr tablet Take 1-2 tablets (50-100 mg total) by mouth as directed. Take 1 tablet (50 mg) in the Morning & 2 tablets (100 mg) at night. (Patient taking differently: Take 50-100 mg by mouth See admin instructions. Take 1 tablet (50 mg) in the Morning & 2 tablets (100 mg) at night.)  . omeprazole (PRILOSEC) 40 MG capsule Take 1 capsule (40 mg total) by mouth daily.  Marland Kitchen oxyCODONE (OXY IR/ROXICODONE) 5 MG immediate release tablet Take 1 tablet (5 mg total) by mouth every 4 (four) hours as needed for moderate pain (pain score 4-6).  Marland Kitchen phenazopyridine (PYRIDIUM) 200 MG tablet Take 1 tablet (200 mg total) by mouth 3 (three) times daily as needed for pain.  . potassium chloride (KLOR-CON) 10 MEQ tablet Take 20 mEq by mouth in the morning and at bedtime.  . torsemide (DEMADEX) 20 MG tablet Take 2 tablets (40 mg total) by mouth 2 (two) times daily.  . traMADol (ULTRAM) 50 MG tablet Take 1 tablet (50 mg total) by mouth every 4 (four) hours as needed for moderate pain.  Marland Kitchen warfarin (COUMADIN) 2 MG tablet TAKE AS DIRECTED BY COUMADIN CLINIC (Patient taking differently: Take 2 mg by mouth See admin instructions. Take 1 tablet (2 mg) by mouth on Sundays, Mondays, Wednesdays, Fridays, & Saturdays.)  . warfarin (COUMADIN) 2.5 MG tablet Take 1.25 mg by mouth See admin instructions. Take 0.5 tablet (1.25 mg) by mouth on  Tuesdays & Thursdays.  . fluconazole (DIFLUCAN) 150 MG tablet Take 1 tablet po once. May repeat dose in 3 days as needed for persistent symptoms.  Marland Kitchen levofloxacin (LEVAQUIN) 500 MG tablet Take 1 tablet (500 mg total) by mouth daily.   No facility-administered encounter medications on file as of 04/13/2019.    Surgical History: Past Surgical History:  Procedure Laterality Date  . ABDOMINAL HYSTERECTOMY     total  . ABDOMINAL HYSTERECTOMY    . BREAST BIOPSY Bilateral 2012  . BREAST BIOPSY Right 08-07-12   fibroadenomatous changes and columnar  cells  . BREAST BIOPSY  02/03/2015   stereo byrnett  . CHOLECYSTECTOMY N/A 02/27/2016   Procedure: LAPAROSCOPIC CHOLECYSTECTOMY;  Surgeon: Christene Lye, MD;  Location: [facility] ORS;  Service: General;  Laterality: N/A;  . COLONOSCOPY WITH PROPOFOL N/A 09/26/2018   Procedure: COLONOSCOPY WITH PROPOFOL;  Surgeon: Lucilla Lame, MD;  Location: [facility] ENDOSCOPY;  Service: Endoscopy;  Laterality: N/A;  . CYSTOSCOPY W/ RETROGRADES Right 08/18/2018   Procedure: CYSTOSCOPY WITH RETROGRADE PYELOGRAM;  Surgeon: Billey Co, MD;  Location: [facility] ORS;  Service: Urology;  Laterality: Right;  . CYSTOSCOPY/URETEROSCOPY/HOLMIUM LASER/STENT PLACEMENT Right 08/18/2018   Procedure: CYSTOSCOPY/URETEROSCOPY/STENT PLACEMENT;  Surgeon: Billey Co, MD;  Location: [facility] ORS;  Service: Urology;  Laterality: Right;  . ESOPHAGOGASTRODUODENOSCOPY (EGD) WITH PROPOFOL N/A 09/26/2018   Procedure: ESOPHAGOGASTRODUODENOSCOPY (EGD) WITH PROPOFOL;  Surgeon: Lucilla Lame, MD;  Location: [facility] ENDOSCOPY;  Service: Endoscopy;  Laterality: N/A;  . GIVENS CAPSULE STUDY N/A 11/03/2018   Procedure: GIVENS CAPSULE STUDY;  Surgeon: Lucilla Lame, MD;  Location: [facility] ENDOSCOPY;  Service: Endoscopy;  Laterality: N/A;  . JOINT REPLACEMENT Left    knee  . KNEE ARTHROPLASTY Right 04/09/2019   Procedure: COMPUTER ASSISTED TOTAL KNEE ARTHROPLASTY;  Surgeon: Dereck Leep, MD;  Location: [facility] ORS;  Service: Orthopedics;  Laterality: Right;  . KNEE CLOSED REDUCTION Left 04/15/2015   Procedure: CLOSED MANIPULATION KNEE;  Surgeon: Thornton Park, MD;  Location: [facility] ORS;  Service: Orthopedics;  Laterality: Left;  . KNEE SURGERY    . TEE WITHOUT CARDIOVERSION N/A 09/13/2017   Procedure: TRANSESOPHAGEAL ECHOCARDIOGRAM (TEE);  Surgeon: Nelva Bush, MD;  Location: [facility] ORS;  Service: Cardiovascular;  Laterality: N/A;  . TOTAL KNEE ARTHROPLASTY Left 12/25/2014   Procedure: TOTAL KNEE ARTHROPLASTY;  Surgeon: Thornton Park, MD;  Location: [facility]  ORS;  Service: Orthopedics;  Laterality: Left;  . TUBAL LIGATION      Medical History: Past Medical History:  Diagnosis Date  . (HFpEF) heart failure with preserved ejection fraction ([place])    a. 08/2017 Echo: EF 55-60%.  Grade 2 diastolic dysfunction.  Moderate mitral stenosis.  . Allergy   . Anemia   . Anxiety   . BRCA negative 03/22/2013  . Bronchitis 02/19/2016   ON LEVAQUIN PO  . Chest tightness   . Cigarette smoker 09/11/2017   8-10 day  . Dyspnea   . GERD (gastroesophageal reflux disease)   . History of kidney stones   . Hyperlipidemia   . Hypertension   . Interstitial lung disease ([place])    a. CT 2013 b. 02/2018 CXR noted recurrent intersistial changes ILD vs chronic bronchitis  . Moderate mitral stenosis    a.  08/2017 TEE: EF 60 to 65%.  Moderate mitral stenosis.  Mean gradient 14 mmHg.  Valve area 2.59 cm by planimetry, 2.72 cm by pressure half-time.  . Persistent atrial fibrillation ([place])    a. 08/2017 s/p TEE/DCCV; b. CHA2DS2VASc = 2-->warfarin.  Family History: Family History  Problem Relation Age of Onset  . Cancer Mother 64       breast  . Hypertension Mother   . Cancer Maternal Aunt        breast  . Cancer Maternal Grandmother        breast  . Osteoarthritis Father   . Hypertension Father     Social History   Socioeconomic History  . Marital status: Married    Spouse name: Not on file  . Number of children: Not on file  . Years of education: Not on file  . Highest education level: Not on file  Occupational History  . Not on file  Tobacco Use  . Smoking status: Former Smoker    Packs/day: 0.50    Years: 20.00    Pack years: 10.00    Types: Cigarettes    Quit date: 02/20/2018    Years since quitting: 1.1  . Smokeless tobacco: Never Used  Substance and Sexual Activity  . Alcohol use: Yes    Comment: rarely  . Drug use: No  . Sexual activity: Not on file  Other Topics Concern  . Not on file  Social History Narrative  . Not on file    Social Determinants of Health   Financial Resource Strain:   . Difficulty of Paying Living Expenses:   Food Insecurity:   . Worried About Charity fundraiser in the Last Year:   . Arboriculturist in the Last Year:   Transportation Needs:   . Film/video editor (Medical):   Marland Kitchen Lack of Transportation (Non-Medical):   Physical Activity:   . Days of Exercise per Week:   . Minutes of Exercise per Session:   Stress:   . Feeling of Stress :   Social Connections:   . Frequency of Communication with Friends and Family:   . Frequency of Social Gatherings with Friends and Family:   . Attends Religious Services:   . Active Member of Clubs or Organizations:   . Attends Archivist Meetings:   Marland Kitchen Marital Status:   Intimate Partner Violence:   . Fear of Current or Ex-Partner:   . Emotionally Abused:   Marland Kitchen Physically Abused:   . Sexually Abused:       Review of Systems  Constitutional: Negative for chills, fatigue, fever and unexpected weight change.  HENT: Positive for congestion, postnasal drip, rhinorrhea, sinus pressure and sinus pain. Negative for sneezing and sore throat.   Respiratory: Positive for cough and wheezing. Negative for chest tightness and shortness of breath.   Cardiovascular: Negative for chest pain and palpitations.  Gastrointestinal: Negative for abdominal pain, constipation, diarrhea, nausea and vomiting.  Genitourinary:       Patient generally develops vaginal yeast infection when on antibiotics,  Musculoskeletal: Negative for arthralgias, back pain, joint swelling and neck pain.       Knee replacement surgery done 04/09/2019  Skin: Negative for rash.  Allergic/Immunologic: Positive for environmental allergies.  Neurological: Negative for dizziness, tremors, numbness and headaches.  Hematological: Negative for adenopathy. Does not bruise/bleed easily.  Psychiatric/Behavioral: Negative for behavioral problems (Depression), sleep disturbance and suicidal  ideas. The patient is not nervous/anxious.     Today's Vitals   04/13/19 0830  Temp: 98.7 F (37.1 C)  Weight: 234 lb (106.1 kg)  Height: '5\' 4"'  (1.626 m)   Body mass index is 40.17 kg/m.  Observation/Objective:   The patient is alert and oriented. She is pleasant and  answers all questions appropriately. Breathing is non-labored. She is in no acute distress at this time. She does sound nasally congested. There is congested, non productive cough auscultated during the visit.    Assessment/Plan: 1. Acute upper respiratory infection Start levofloxacin 564m daily for 10 days. Rest and increase fluids. Use OTC medication as needed and as indicated for acute symptoms.  - levofloxacin (LEVAQUIN) 500 MG tablet; Take 1 tablet (500 mg total) by mouth daily.  Dispense: 10 tablet; Refill: 0  2. Cough Recommend OTC cough suppressant such as delsym as needed and as indicated for cough   3. Vaginal candidiasis Diflucan should be taken as needed and as prescribed if yeast infection develops.  - fluconazole (DIFLUCAN) 150 MG tablet; Take 1 tablet po once. May repeat dose in 3 days as needed for persistent symptoms.  Dispense: 3 tablet; Refill: 0  General Counseling: Avalie verbalizes understanding of the findings of today's phone visit and agrees with plan of treatment. I have discussed any further diagnostic evaluation that may be needed or ordered today. We also reviewed her medications today. she has been encouraged to call the office with any questions or concerns that should arise related to todays visit.   Rest and increase fluids. Continue using OTC medication to control symptoms.   This patient was seen by [place] Dr FLavera Guiseas a part of collaborative care agreement  Meds ordered this encounter  Medications  . levofloxacin (LEVAQUIN) 500 MG tablet    Sig: Take 1 tablet (500 mg total) by mouth daily.    Dispense:  10 tablet    Refill:  0    Order  Specific Question:   Supervising Provider    Answer:   KLavera Guise[[[id number]] . fluconazole (DIFLUCAN) 150 MG tablet    Sig: Take 1 tablet po once. May repeat dose in 3 days as needed for persistent symptoms.    Dispense:  3 tablet    Refill:  0    Order Specific Question:   Supervising Provider    Answer:   KLavera Guise[[[id number]]   Time spent: 273Minutes    Dr FLavera GuiseInternal medicine

## 2019-04-16 ENCOUNTER — Ambulatory Visit: Payer: Managed Care, Other (non HMO)

## 2019-04-17 ENCOUNTER — Telehealth: Payer: Self-pay | Admitting: *Deleted

## 2019-04-17 NOTE — Telephone Encounter (Signed)
Patient is returning your call.  

## 2019-04-17 NOTE — Telephone Encounter (Signed)
Left voicemail message to call back  

## 2019-04-17 NOTE — Telephone Encounter (Signed)
-----   Message from Minna Merritts, MD sent at 04/15/2019 10:36 AM EDT ----- Based on lab work March 12 it appears her potassium was very low It has been repleted since that time Can we confirm on March 12 that she was taking potassium 20 twice daily She does take high-dose torsemide twice daily If she was on potassium 20 twice daily it would imply it is not enough either that or she was missing doses We may need to adjust potassium going forward

## 2019-04-17 NOTE — Telephone Encounter (Signed)
Spoke with patient and reviewed providers questions and recommendations. She states that when her potassium was low she was only taking it once a day and then it was increased to 20 mEq twice a day and since then her numbers have been better. She is presently taking Torsemide 40 mg twice a day with Potassium 20 mEq twice a day. She just had her knee replacement and has been doing well except elevated heart rates with her therapy. Confirmed this can be normal with the therapy and pain. She verbalized understanding of our conversation and had no further questions at this time.

## 2019-04-24 ENCOUNTER — Encounter: Payer: Self-pay | Admitting: *Deleted

## 2019-04-24 LAB — ALDOSTERONE + RENIN ACTIVITY W/ RATIO
ALDO / PRA Ratio: 1.1 (ref 0.0–30.0)
Aldosterone: 15.5 ng/dL (ref 0.0–30.0)
PRA LC/MS/MS: 14.003 ng/mL/hr — ABNORMAL HIGH (ref 0.167–5.380)

## 2019-04-30 ENCOUNTER — Other Ambulatory Visit: Payer: Self-pay

## 2019-04-30 ENCOUNTER — Ambulatory Visit (INDEPENDENT_AMBULATORY_CARE_PROVIDER_SITE_OTHER): Payer: Managed Care, Other (non HMO)

## 2019-04-30 DIAGNOSIS — Z5181 Encounter for therapeutic drug level monitoring: Secondary | ICD-10-CM | POA: Diagnosis not present

## 2019-04-30 DIAGNOSIS — I050 Rheumatic mitral stenosis: Secondary | ICD-10-CM | POA: Diagnosis not present

## 2019-04-30 DIAGNOSIS — I4891 Unspecified atrial fibrillation: Secondary | ICD-10-CM

## 2019-04-30 LAB — POCT INR: INR: 1.6 — AB (ref 2.0–3.0)

## 2019-04-30 NOTE — Patient Instructions (Signed)
-   take 2 mg tonight, 2.5 mg tomorrow, then  - continue warfarin dosage of 1.25 mg (half the 2.5 mg tablet) every day EXCEPT 1 mg (half the 1 mg tablet) on MONDAYS, [place].  - recheck INR in 4 wks

## 2019-05-28 ENCOUNTER — Telehealth: Payer: Self-pay

## 2019-05-28 ENCOUNTER — Other Ambulatory Visit: Payer: Self-pay | Admitting: Physician Assistant

## 2019-05-28 ENCOUNTER — Other Ambulatory Visit: Payer: Self-pay

## 2019-05-28 ENCOUNTER — Ambulatory Visit (INDEPENDENT_AMBULATORY_CARE_PROVIDER_SITE_OTHER): Payer: Managed Care, Other (non HMO)

## 2019-05-28 DIAGNOSIS — I050 Rheumatic mitral stenosis: Secondary | ICD-10-CM

## 2019-05-28 DIAGNOSIS — Z5181 Encounter for therapeutic drug level monitoring: Secondary | ICD-10-CM

## 2019-05-28 DIAGNOSIS — I10 Essential (primary) hypertension: Secondary | ICD-10-CM

## 2019-05-28 DIAGNOSIS — I4891 Unspecified atrial fibrillation: Secondary | ICD-10-CM | POA: Diagnosis not present

## 2019-05-28 LAB — POCT INR: INR: 1.7 — AB (ref 2.0–3.0)

## 2019-05-28 MED ORDER — WARFARIN SODIUM 2.5 MG PO TABS: 1.2500 mg | ORAL_TABLET | ORAL | 0 refills | Status: DC

## 2019-05-28 NOTE — Patient Instructions (Signed)
-   START NEW  warfarin dosage of 1.25 mg (half the 2.5 mg tablet) every day.   - recheck INR in 4 wks

## 2019-05-28 NOTE — Telephone Encounter (Signed)
-----   Message from Rise Mu, PA-C sent at 05/28/2019 11:22 AM EDT ----- No evidence of renal artery stenosis. Follow up with Dr. Rockey Situ as previously directed.

## 2019-05-28 NOTE — Telephone Encounter (Signed)
spoke to patient to review renal u/s results.    Pt verbalized understanding and has no further questions at this time.    Advised pt to call for any further questions or concerns.  No further orders.

## 2019-05-28 NOTE — Telephone Encounter (Signed)
Attempted to call patient. LMTCB 05/28/2019

## 2019-05-29 ENCOUNTER — Telehealth: Payer: Self-pay

## 2019-05-29 NOTE — Telephone Encounter (Signed)
Lmom to confirm and screen for 05-31-19 ov.

## 2019-05-31 ENCOUNTER — Other Ambulatory Visit: Payer: Self-pay

## 2019-05-31 ENCOUNTER — Encounter: Payer: Self-pay | Admitting: Nurse Practitioner

## 2019-05-31 ENCOUNTER — Ambulatory Visit: Payer: Managed Care, Other (non HMO) | Admitting: Nurse Practitioner

## 2019-05-31 VITALS — BP 130/59 | HR 73 | Temp 97.4°F | Resp 16 | Ht 64.0 in | Wt 233.0 lb

## 2019-05-31 DIAGNOSIS — B3731 Acute candidiasis of vulva and vagina: Secondary | ICD-10-CM

## 2019-05-31 DIAGNOSIS — B9689 Other specified bacterial agents as the cause of diseases classified elsewhere: Secondary | ICD-10-CM | POA: Diagnosis not present

## 2019-05-31 DIAGNOSIS — B373 Candidiasis of vulva and vagina: Secondary | ICD-10-CM | POA: Diagnosis not present

## 2019-05-31 DIAGNOSIS — N760 Acute vaginitis: Secondary | ICD-10-CM

## 2019-05-31 DIAGNOSIS — R300 Dysuria: Secondary | ICD-10-CM

## 2019-05-31 LAB — POCT URINALYSIS DIPSTICK
Bilirubin, UA: NEGATIVE
Blood, UA: NEGATIVE
Glucose, UA: NEGATIVE
Ketones, UA: NEGATIVE
Leukocytes, UA: NEGATIVE
Nitrite, UA: NEGATIVE
Protein, UA: NEGATIVE
Spec Grav, UA: 1.01 (ref 1.010–1.025)
Urobilinogen, UA: 0.2 E.U./dL
pH, UA: 6 (ref 5.0–8.0)

## 2019-05-31 MED ORDER — FLUCONAZOLE 150 MG PO TABS: ORAL_TABLET | ORAL | 1 refills | Status: DC

## 2019-05-31 MED ORDER — METRONIDAZOLE 0.75 % VA GEL: 1.0000 | Freq: Every day | VAGINAL | 0 refills | Status: DC

## 2019-05-31 NOTE — Progress Notes (Signed)
[facility] [place], [place] [address]  Internal MEDICINE  Office Visit Note  Patient Name: Leslie [name]  [id number]  [id number]  Date of Service: 06/06/2019   Pt is here for a sick visit.   Chief Complaint  Patient presents with  . Urinary Tract Infection    itching and burning, lower back pain      The patient is here for sick visit. She states that she has been having right sided lower back pain. Also having itching and irritation of the vaginal area. She has noted some discharge with odor. This started about a week ago. Gradually getting worse. Urine sample today is showing no evidence of infection or other abnormalities. She recently had ultrasound of kidneys and everything looked good. She had significant renal stone which led to surgery and removal of renal stone. She then developed infection, causing her to be hospitalization.       Current Medication:  Outpatient Encounter Medications as of 05/31/2019  Medication Sig  . albuterol (VENTOLIN HFA) 108 (90 Base) MCG/ACT inhaler Inhale 2 puffs into the lungs every 4 (four) hours as needed for wheezing or shortness of breath.  Marland Kitchen atorvastatin (LIPITOR) 80 MG tablet Take 1 tablet (80 mg total) by mouth daily at 6 PM. (Patient taking differently: Take 80 mg by mouth at bedtime. )  . diltiazem (CARDIZEM CD) 120 MG 24 hr capsule Take 1 capsule (120 mg total) by mouth daily.  . fluconazole (DIFLUCAN) 150 MG tablet Take 1 tablet po once. May repeat dose in 3 days as needed for persistent symptoms.  . metoprolol succinate (TOPROL-XL) 50 MG 24 hr tablet Take 1-2 tablets (50-100 mg total) by mouth as directed. Take 1 tablet (50 mg) in the Morning & 2 tablets (100 mg) at night. (Patient taking differently: Take 50-100 mg by mouth See admin instructions. Take 1 tablet (50 mg) in the Morning & 2 tablets (100 mg) at night.)  . omeprazole (PRILOSEC) 40 MG capsule Take 1 capsule (40 mg total) by mouth daily.  Marland Kitchen  oxyCODONE (OXY IR/ROXICODONE) 5 MG immediate release tablet Take 1 tablet (5 mg total) by mouth every 4 (four) hours as needed for moderate pain (pain score 4-6).  Marland Kitchen phenazopyridine (PYRIDIUM) 200 MG tablet Take 1 tablet (200 mg total) by mouth 3 (three) times daily as needed for pain.  . potassium chloride (KLOR-CON) 10 MEQ tablet Take 20 mEq by mouth in the morning and at bedtime.  . torsemide (DEMADEX) 20 MG tablet Take 2 tablets (40 mg total) by mouth 2 (two) times daily.  . traMADol (ULTRAM) 50 MG tablet Take 1 tablet (50 mg total) by mouth every 4 (four) hours as needed for moderate pain.  Marland Kitchen warfarin (COUMADIN) 2 MG tablet TAKE AS DIRECTED BY COUMADIN CLINIC (Patient taking differently: Take 2 mg by mouth See admin instructions. Take 1 tablet (2 mg) by mouth on Sundays, Mondays, Wednesdays, Fridays, & Saturdays.)  . warfarin (COUMADIN) 2.5 MG tablet Take 0.5 tablets (1.25 mg total) by mouth See admin instructions. Take 0.5 tablet (1.25 mg) by mouth on Tuesdays & Thursdays.  . [DISCONTINUED] ALPRAZolam (XANAX) 0.5 MG tablet Take 1 tablet (0.5 mg total) by mouth at bedtime as needed for anxiety.  . [DISCONTINUED] fluconazole (DIFLUCAN) 150 MG tablet Take 1 tablet po once. May repeat dose in 3 days as needed for persistent symptoms.  . metroNIDAZOLE (METROGEL) 0.75 % vaginal gel Place 1 Applicatorful vaginally at bedtime.  . [DISCONTINUED] levofloxacin (LEVAQUIN) 500  MG tablet Take 1 tablet (500 mg total) by mouth daily. (Patient not taking: Reported on 05/31/2019)   No facility-administered encounter medications on file as of 05/31/2019.      Medical History: Past Medical History:  Diagnosis Date  . (HFpEF) heart failure with preserved ejection fraction ([place])    a. 08/2017 Echo: EF 55-60%.  Grade 2 diastolic dysfunction.  Moderate mitral stenosis.  . Allergy   . Anemia   . Anxiety   . BRCA negative 03/22/2013  . Bronchitis 02/19/2016   ON LEVAQUIN PO  . Chest tightness   . Cigarette  smoker 09/11/2017   8-10 day  . Dyspnea   . GERD (gastroesophageal reflux disease)   . History of kidney stones   . Hyperlipidemia   . Hypertension   . Interstitial lung disease ([place])    a. CT 2013 b. 02/2018 CXR noted recurrent intersistial changes ILD vs chronic bronchitis  . Moderate mitral stenosis    a.  08/2017 TEE: EF 60 to 65%.  Moderate mitral stenosis.  Mean gradient 14 mmHg.  Valve area 2.59 cm by planimetry, 2.72 cm by pressure half-time.  . Persistent atrial fibrillation ([place])    a. 08/2017 s/p TEE/DCCV; b. CHA2DS2VASc = 2-->warfarin.    Today's Vitals   05/31/19 1500  BP: (!) 130/59  Pulse: 73  Resp: 16  Temp: (!) 97.4 F (36.3 C)  SpO2: 98%  Weight: 233 lb (105.7 kg)  Height: _0  (1.626 m)   Body mass index is 39.99 kg/m.  Review of Systems  Constitutional: Negative for activity change, chills, fatigue and unexpected weight change.  HENT: Negative for congestion, postnasal drip, rhinorrhea, sneezing and sore throat.   Respiratory: Negative for cough, chest tightness, shortness of breath and wheezing.   Cardiovascular: Negative for chest pain and palpitations.  Gastrointestinal: Negative for abdominal pain, constipation, diarrhea, nausea and vomiting.  Genitourinary: Positive for vaginal discharge. Negative for dysuria and frequency.       Vaginal itching and irritation.   Musculoskeletal: Negative for arthralgias, back pain, joint swelling and neck pain.  Skin: Negative for rash.  Allergic/Immunologic: Negative for environmental allergies.  Neurological: Negative for dizziness, tremors, numbness and headaches.  Hematological: Negative for adenopathy. Does not bruise/bleed easily.  Psychiatric/Behavioral: Negative for behavioral problems (Depression), sleep disturbance and suicidal ideas. The patient is not nervous/anxious.     Physical Exam Constitutional:      General: She is not in acute distress.    Appearance: She is well-developed. She is not  diaphoretic.  HENT:     Head: Normocephalic and atraumatic.     Mouth/Throat:     Pharynx: No oropharyngeal exudate.  Eyes:     Pupils: Pupils are equal, round, and reactive to light.  Neck:     Thyroid: No thyromegaly.     Vascular: No JVD.     Trachea: No tracheal deviation.  Cardiovascular:     Rate and Rhythm: Normal rate and regular rhythm.     Heart sounds: Normal heart sounds. No murmur. No friction rub. No gallop.   Pulmonary:     Effort: Pulmonary effort is normal. No respiratory distress.     Breath sounds: Normal breath sounds. No wheezing or rales.  Chest:     Chest wall: No tenderness.  Abdominal:     Palpations: Abdomen is soft.  Genitourinary:    Comments: Urine sample negative for infection or other abnormalities.  Musculoskeletal:        General: Normal range of  motion.     Cervical back: Normal range of motion and neck supple.  Lymphadenopathy:     Cervical: No cervical adenopathy.  Skin:    General: Skin is warm and dry.  Neurological:     Mental Status: She is alert and oriented to person, place, and time.     Cranial Nerves: No cranial nerve deficit.  Psychiatric:        Behavior: Behavior normal.        Thought Content: Thought content normal.        Judgment: Judgment normal.    Assessment/Plan: 1. Bacterial vaginitis Metronidazole vaginal gel should be used nightly for next 7 nights.  - metroNIDAZOLE (METROGEL) 0.75 % vaginal gel; Place 1 Applicatorful vaginally at bedtime.  Dispense: 70 g; Refill: 0  2. Vaginal candidiasis Diflucan should be taken once. May repeat dose in three days for persistent symptoms. - fluconazole (DIFLUCAN) 150 MG tablet; Take 1 tablet po once. May repeat dose in 3 days as needed for persistent symptoms.  Dispense: 3 tablet; Refill: 1  3. Dysuria - POCT Urinalysis Dipstick negative for urinary tract infection or other abnormalities   General Counseling: Hedda verbalizes understanding of the findings of todays visit  and agrees with plan of treatment. I have discussed any further diagnostic evaluation that may be needed or ordered today. We also reviewed her medications today. she has been encouraged to call the office with any questions or concerns that should arise related to todays visit.    Counseling:  This patient was seen by Leretha Pol FNP Collaboration with Dr Lavera Guise as a part of collaborative care agreement  Orders Placed This Encounter  Procedures  . POCT Urinalysis Dipstick    Meds ordered this encounter  Medications  . metroNIDAZOLE (METROGEL) 0.75 % vaginal gel    Sig: Place 1 Applicatorful vaginally at bedtime.    Dispense:  70 g    Refill:  0    Order Specific Question:   Supervising Provider    Answer:   Lavera Guise [[id number]]  . fluconazole (DIFLUCAN) 150 MG tablet    Sig: Take 1 tablet po once. May repeat dose in 3 days as needed for persistent symptoms.    Dispense:  3 tablet    Refill:  1    Order Specific Question:   Supervising Provider    Answer:   Lavera Guise [[id number]]    Time spent: 30 Minutes

## 2019-06-01 ENCOUNTER — Other Ambulatory Visit: Payer: Self-pay

## 2019-06-01 DIAGNOSIS — F419 Anxiety disorder, unspecified: Secondary | ICD-10-CM

## 2019-06-01 MED ORDER — ALPRAZOLAM 0.5 MG PO TABS: 0.5000 mg | ORAL_TABLET | Freq: Every evening | ORAL | 2 refills | Status: DC | PRN

## 2019-06-04 ENCOUNTER — Other Ambulatory Visit: Payer: Self-pay

## 2019-06-06 ENCOUNTER — Telehealth: Payer: Self-pay

## 2019-06-06 NOTE — Telephone Encounter (Signed)
Ortho prescription signed by provider for Cryo compression wrap and faxed back to [facility] at [phone]. Copy placed in scan.

## 2019-06-15 ENCOUNTER — Other Ambulatory Visit: Payer: Self-pay

## 2019-06-15 DIAGNOSIS — K219 Gastro-esophageal reflux disease without esophagitis: Secondary | ICD-10-CM

## 2019-06-15 MED ORDER — OMEPRAZOLE 40 MG PO CPDR: 40.0000 mg | DELAYED_RELEASE_CAPSULE | Freq: Every day | ORAL | 1 refills | Status: DC

## 2019-06-21 ENCOUNTER — Other Ambulatory Visit: Payer: Self-pay

## 2019-06-21 ENCOUNTER — Ambulatory Visit: Payer: Managed Care, Other (non HMO) | Admitting: Nurse Practitioner

## 2019-06-21 DIAGNOSIS — K219 Gastro-esophageal reflux disease without esophagitis: Secondary | ICD-10-CM

## 2019-06-21 NOTE — Telephone Encounter (Signed)
*  STAT* If patient is at the pharmacy, call can be transferred to refill team.   1. Which medications need to be refilled? (please list name of each medication and dose if known)  Omeprazole  2. Which pharmacy/location (including street and city if local pharmacy) is medication to be sent to? [facility]  3. Do they need a 30 day or 90 day supply? [place]

## 2019-06-27 ENCOUNTER — Other Ambulatory Visit: Payer: Self-pay

## 2019-06-27 ENCOUNTER — Ambulatory Visit (INDEPENDENT_AMBULATORY_CARE_PROVIDER_SITE_OTHER): Payer: Managed Care, Other (non HMO)

## 2019-06-27 DIAGNOSIS — Z5181 Encounter for therapeutic drug level monitoring: Secondary | ICD-10-CM | POA: Diagnosis not present

## 2019-06-27 DIAGNOSIS — I4891 Unspecified atrial fibrillation: Secondary | ICD-10-CM

## 2019-06-27 DIAGNOSIS — I050 Rheumatic mitral stenosis: Secondary | ICD-10-CM | POA: Diagnosis not present

## 2019-06-27 LAB — POCT INR: INR: 2.4 (ref 2.0–3.0)

## 2019-06-27 MED ORDER — PERFLUTREN LIPID MICROSPHERE
1.0000 mL | INTRAVENOUS | Status: AC | PRN
  Administered 2019-06-27: 2 mL via INTRAVENOUS

## 2019-06-27 NOTE — Patient Instructions (Signed)
-   continue warfarin dosage of 1.25 mg (half the 2.5 mg tablet) every day.   - recheck INR in 5 wks

## 2019-07-02 ENCOUNTER — Telehealth: Payer: Self-pay | Admitting: Cardiovascular Disease

## 2019-07-02 NOTE — Telephone Encounter (Signed)
Reviewed echo results and recommendations with patient and she verbalized understanding. She did agree to virtual visit with provider and confirmed date and time with her.   Leslie [name] has provided verbal consent on 07/02/2019 for a virtual visit (video or telephone).

## 2019-07-02 NOTE — Telephone Encounter (Signed)
Please call with echo results 

## 2019-07-02 NOTE — Telephone Encounter (Signed)
-----   Message from Minna Merritts, MD sent at 07/01/2019  6:28 PM EDT ----- Echocardiogram report Continues to show severe mitral valve stenosis Documented on this report is moderately elevated right heart pressures Normal ejection fraction Dramatic change in pressure gradients compared to previous years  When she like to meet with valve surgeon to discuss a percutaneous procedure, valvuloplasty.  We can talk about it in the office if she would like.  Would be a balloon to open up the valve, this would help keep the right heart pressures lower and keep it from becoming permanent/pulmonary hypertension, as well as help drop pressures on the left side of the heart  At the very least would need echo every year, call us for any change in symptoms

## 2019-07-03 NOTE — Progress Notes (Signed)
Virtual Visit via Video Note   This visit type was conducted due to national recommendations for restrictions regarding the COVID-19 Pandemic (e.g. social distancing) in an effort to limit this patient's exposure and mitigate transmission in our community.  Due to her co-morbid illnesses, this patient is at least at moderate risk for complications without adequate follow up.  This format is felt to be most appropriate for this patient at this time.  All issues noted in this document were discussed and addressed.  A limited physical exam was performed with this format.  Please refer to the patient's chart for her consent to telehealth for [facility].   I connected with  Leslie [name] on 07/04/19 by a video enabled telemedicine application and verified that I am speaking with the correct person using two identifiers. I discussed the limitations of evaluation and management by telemedicine. The patient expressed understanding and agreed to proceed.   Evaluation Performed:  Follow-up visit  Date:  07/04/2019   ID:  Leslie [name], DOB Jul 09, 1965, MRN [id number]  Patient Location:  [place] [address]   Provider location:   [facility], [place] office  PCP:  Ronnell Freshwater, NP  Cardiologist:  Arvid Right [facility]   Chief Complaint  Patient presents with  . other    6 month f/u echo c/o rapid heart rate and skipped beats. Meds reviewed verbally with pt.    History of Present Illness:    Leslie [name] is a 54 y.o. female who presents via Engineer, civil (consulting) for a telehealth visit today.   The patient does not symptoms concerning for COVID-19 infection (fever, chills, cough, or new SHORTNESS OF BREATH).   Patient has a past medical history of mitral valve stenosis morbid obesity,  Snores, possible OSA anxiety,  hyperlipidemia,  Hospital admission 09/11/2017 for  shortness of breath chest tightness in atrial fibrillation with RVR Had TEE   , Cardioversion Mild COPD, long hx of smoking Cardiac CTA Coronary calcium score of 10. Who presents for follow up of her atrial fibrillation, mitral valve stenosis  Last office visit January 2021 At that time was feeling relatively well  was walking with her husband, housework with no restrictions  Reported 1 episode of tachycardia lasting 30 minutes, took metoprolol and symptoms went away Little bit of ankle swelling, taking torsemide 40 twice daily potassium 20 daily  Chronically low potassium knee replacement Dr. Marry Guan  On today's discussion she reports having more arrhythmia More shortness of breath on exertion Worsening compared to prior office visit More sedentary, working from home Compliant with her torsemide 40 twice daily  Echocardiogram results discussed with her detailing severe mitral valve stenosis, moderate elevated right heart pressures  Husband on the line, they would like to pursue a more aggressive approach for the mitral valve stenosis  Other past medical history reviewed Echocardiogram August 2020  mitral valve Moderate thickening of the mitral valve leaflet. Moderate-severe mitral valve stenosis, with mean gradient of 16 mmHg and MVA by continuity equation of 1.0 cm^2.   Transesophageal echo August 2019 with moderate mitral valve stenosis mean gradient of 14 Mobility of the posterior leaflet was restricted.   summer 2020 developed right flank pain,   underwent right ureteroscopy, laser lithotripsy, and stent placement on 08/18/2018.   After stent removal, the patient reports progressive generalized malaise, fatigue, and nausea.  For continued pain she was taking  naproxen (up to 800 mg).  seen at urgent care  and prescribed trimethoprim/sulfamethoxazole and tamsulosin.  continued malaise, the patient presented to the [facility] ED on 08/26/2018, complaining of persistent flank pain as well as shortness of breath, palpitations, and chills.    CT of the  abdomen and pelvis showed enlarged right kidney with diffuse perinephric edema and mild dilation of the right collecting system and right ureter.   creatinine of 2.1 (up from 1.3 on 08/15/2018).  Creatinine trended up  to 4.1  Since has recovered creatinine 1.6  She had acute kidney injury and sepsis in the setting of recent right ureteral stone status post lithotripsy and likely pyelonephritis.  atrial fibrillation December 2019 01/05/2018 awoke suddenly with tachypalpitations  rates in the 130s  EMS was called, noted to be in sinus rhythm with heart rates in the 80s to 90s bpm.   continued paroxysmal symptoms  seen on 12/20.  EKG at that time showed sinus rhythm. Her diltiazem was increased to 240 mg daily.   Cardiac CTA 1. Coronary calcium score of 10. This was 55 percentile for age and sex matched control.. Normal coronary origin with right dominance.Mild non-obstructive CAD.   Prior CV studies:   The following studies were reviewed today:    Past Medical History:  Diagnosis Date  . (HFpEF) heart failure with preserved ejection fraction ([place])    a. 08/2017 Echo: EF 55-60%.  Grade 2 diastolic dysfunction.  Moderate mitral stenosis.  . Allergy   . Anemia   . Anxiety   . BRCA negative 03/22/2013  . Bronchitis 02/19/2016   ON LEVAQUIN PO  . Chest tightness   . Cigarette smoker 09/11/2017   8-10 day  . Dyspnea   . GERD (gastroesophageal reflux disease)   . History of kidney stones   . Hyperlipidemia   . Hypertension   . Interstitial lung disease ([place])    a. CT 2013 b. 02/2018 CXR noted recurrent intersistial changes ILD vs chronic bronchitis  . Moderate mitral stenosis    a.  08/2017 TEE: EF 60 to 65%.  Moderate mitral stenosis.  Mean gradient 14 mmHg.  Valve area 2.59 cm by planimetry, 2.72 cm by pressure half-time.  . Persistent atrial fibrillation ([place])    a. 08/2017 s/p TEE/DCCV; b. CHA2DS2VASc = 2-->warfarin.   Past Surgical History:  Procedure Laterality  Date  . ABDOMINAL HYSTERECTOMY     total  . ABDOMINAL HYSTERECTOMY    . BREAST BIOPSY Bilateral 2012  . BREAST BIOPSY Right 08-07-12   fibroadenomatous changes and columnar cells  . BREAST BIOPSY  02/03/2015   stereo byrnett  . CHOLECYSTECTOMY N/A 02/27/2016   Procedure: LAPAROSCOPIC CHOLECYSTECTOMY;  Surgeon: Christene Lye, MD;  Location: [facility] ORS;  Service: General;  Laterality: N/A;  . COLONOSCOPY WITH PROPOFOL N/A 09/26/2018   Procedure: COLONOSCOPY WITH PROPOFOL;  Surgeon: Lucilla Lame, MD;  Location: [facility] ENDOSCOPY;  Service: Endoscopy;  Laterality: N/A;  . CYSTOSCOPY W/ RETROGRADES Right 08/18/2018   Procedure: CYSTOSCOPY WITH RETROGRADE PYELOGRAM;  Surgeon: Billey Co, MD;  Location: [facility] ORS;  Service: Urology;  Laterality: Right;  . CYSTOSCOPY/URETEROSCOPY/HOLMIUM LASER/STENT PLACEMENT Right 08/18/2018   Procedure: CYSTOSCOPY/URETEROSCOPY/STENT PLACEMENT;  Surgeon: Billey Co, MD;  Location: [facility] ORS;  Service: Urology;  Laterality: Right;  . ESOPHAGOGASTRODUODENOSCOPY (EGD) WITH PROPOFOL N/A 09/26/2018   Procedure: ESOPHAGOGASTRODUODENOSCOPY (EGD) WITH PROPOFOL;  Surgeon: Lucilla Lame, MD;  Location: [facility] ENDOSCOPY;  Service: Endoscopy;  Laterality: N/A;  . GIVENS CAPSULE STUDY N/A 11/03/2018   Procedure: GIVENS CAPSULE STUDY;  Surgeon: Lucilla Lame, MD;  Location:  [facility];  Service: Endoscopy;  Laterality: N/A;  . JOINT REPLACEMENT Left    knee  . KNEE ARTHROPLASTY Right 04/09/2019   Procedure: COMPUTER ASSISTED TOTAL KNEE ARTHROPLASTY;  Surgeon: Dereck Leep, MD;  Location: [facility] ORS;  Service: Orthopedics;  Laterality: Right;  . KNEE CLOSED REDUCTION Left 04/15/2015   Procedure: CLOSED MANIPULATION KNEE;  Surgeon: Thornton Park, MD;  Location: [facility] ORS;  Service: Orthopedics;  Laterality: Left;  . KNEE SURGERY    . TEE WITHOUT CARDIOVERSION N/A 09/13/2017   Procedure: TRANSESOPHAGEAL ECHOCARDIOGRAM (TEE);  Surgeon: Nelva Bush, MD;  Location: [facility]  ORS;  Service: Cardiovascular;  Laterality: N/A;  . TOTAL KNEE ARTHROPLASTY Left 12/25/2014   Procedure: TOTAL KNEE ARTHROPLASTY;  Surgeon: Thornton Park, MD;  Location: [facility] ORS;  Service: Orthopedics;  Laterality: Left;  . TUBAL LIGATION        Allergies:   Morphine, Oxycodone hcl, and Dilaudid [hydromorphone hcl]   Social History   Tobacco Use  . Smoking status: Former Smoker    Packs/day: 0.50    Years: 20.00    Pack years: 10.00    Types: Cigarettes    Quit date: 02/20/2018    Years since quitting: 1.3  . Smokeless tobacco: Never Used  Vaping Use  . Vaping Use: Never used  Substance Use Topics  . Alcohol use: Yes    Comment: rarely  . Drug use: No     Current Outpatient Medications on File Prior to Visit  Medication Sig Dispense Refill  . albuterol (VENTOLIN HFA) 108 (90 Base) MCG/ACT inhaler Inhale 2 puffs into the lungs every 4 (four) hours as needed for wheezing or shortness of breath. 3 Inhaler 0  . ALPRAZolam (XANAX) 0.5 MG tablet Take 1 tablet (0.5 mg total) by mouth at bedtime as needed for anxiety. 30 tablet 2  . atorvastatin (LIPITOR) 80 MG tablet Take 80 mg by mouth daily.    Marland Kitchen diltiazem (CARDIZEM CD) 120 MG 24 hr capsule Take 1 capsule (120 mg total) by mouth daily. 90 capsule 3  . metoprolol succinate (TOPROL-XL) 50 MG 24 hr tablet Take 1 tablet by mouth in the am and 2 tablets by mouth at night.    . metroNIDAZOLE (METROGEL) 0.75 % vaginal gel Place 1 Applicatorful vaginally at bedtime. 70 g 0  . omeprazole (PRILOSEC) 40 MG capsule Take 1 capsule (40 mg total) by mouth daily. 90 capsule 1  . potassium chloride (KLOR-CON) 10 MEQ tablet Take 20 mEq by mouth in the morning and at bedtime.    . torsemide (DEMADEX) 20 MG tablet Take 2 tablets (40 mg total) by mouth 2 (two) times daily. 360 tablet 3  . warfarin (COUMADIN) 2 MG tablet TAKE AS DIRECTED BY COUMADIN CLINIC (Patient taking differently: Take 2 mg by mouth See admin instructions. Take 1 tablet (2 mg) by  mouth on Sundays, Mondays, Wednesdays, Fridays, & Saturdays.) 30 tablet 1  . warfarin (COUMADIN) 2.5 MG tablet Take 0.5 tablets (1.25 mg total) by mouth See admin instructions. Take 0.5 tablet (1.25 mg) by mouth on Tuesdays & Thursdays. 30 tablet 0   No current facility-administered medications on file prior to visit.     Family Hx: The patient's family history includes Cancer in her maternal aunt and maternal grandmother; Cancer (age of onset: 49) in her mother; Hypertension in her father and mother; Osteoarthritis in her father.  ROS:   Please see the history of present illness.    Review of Systems  Constitutional: Negative.  HENT: Negative.   Respiratory: Negative.   Cardiovascular: Negative.   Gastrointestinal: Negative.   Musculoskeletal: Negative.   Neurological: Negative.   Psychiatric/Behavioral: Negative.   All other systems reviewed and are negative.     Labs/Other Tests and Data Reviewed:    Recent Labs: 08/26/2018: B Natriuretic Peptide 539.0; TSH 0.864 11/28/2018: Magnesium 1.9 03/30/2019: ALT 22; Platelets 391 04/09/2019: BUN 18; Creatinine, Ser 1.40; Hemoglobin 11.2; Potassium 4.7; Sodium 139   Recent Lipid Panel Lab Results  Component Value Date/Time   CHOL 171 09/19/2018 09:32 AM   CHOL 170 04/07/2018 09:10 AM   TRIG 186 (H) 09/19/2018 09:32 AM   HDL 33 (L) 09/19/2018 09:32 AM   HDL 34 (L) 04/07/2018 09:10 AM   CHOLHDL 5.2 09/19/2018 09:32 AM   LDLCALC 101 (H) 09/19/2018 09:32 AM   LDLCALC 109 (H) 04/07/2018 09:10 AM    Wt Readings from Last 3 Encounters:  07/04/19 228 lb (103.4 kg)  05/31/19 233 lb (105.7 kg)  04/13/19 234 lb (106.1 kg)     Exam:    Vital Signs: Vital signs may also be detailed in the HPI Ht '5\' 4"'  (1.626 m)   Wt 228 lb (103.4 kg)   BMI 39.14 kg/m   Wt Readings from Last 3 Encounters:  07/04/19 228 lb (103.4 kg)  05/31/19 233 lb (105.7 kg)  04/13/19 234 lb (106.1 kg)   Temp Readings from Last 3 Encounters:  05/31/19 (!)  97.4 F (36.3 C)  04/13/19 98.7 F (37.1 C)  04/11/19 97.9 F (36.6 C) (Oral)   BP Readings from Last 3 Encounters:  05/31/19 (!) 130/59  04/11/19 127/66  03/19/19 129/65   Pulse Readings from Last 3 Encounters:  05/31/19 73  04/11/19 78  03/19/19 75     Well nourished, well developed female in no acute distress. Constitutional:  oriented to person, place, and time. No distress.    ASSESSMENT & PLAN:    Problem List Items Addressed This Visit      Cardiology Problems   Atrial fibrillation (HCC) - Primary   Relevant Medications   atorvastatin (LIPITOR) 80 MG tablet   metoprolol succinate (TOPROL-XL) 50 MG 24 hr tablet   Mitral valve stenosis   Relevant Medications   atorvastatin (LIPITOR) 80 MG tablet   metoprolol succinate (TOPROL-XL) 50 MG 24 hr tablet    Other Visit Diagnoses    Chronic diastolic CHF (congestive heart failure) (HCC)       Relevant Medications   atorvastatin (LIPITOR) 80 MG tablet   metoprolol succinate (TOPROL-XL) 50 MG 24 hr tablet   Smoker       Hypercholesteremia       Relevant Medications   atorvastatin (LIPITOR) 80 MG tablet   metoprolol succinate (TOPROL-XL) 50 MG 24 hr tablet     Mitral valve stenosis Elevated mean gradient over the past 2 years, now with moderately elevated right heart pressures, having more symptoms of shortness of breath, tachycardia Already taking metoprolol and high doses, torsemide 40 twice daily She is interested in pursuing more aggressive approach for her mitral valve given worsening symptoms in the past 6 months -Husband on the line with her today, long discussion Discussed various treatment options, balloon valvuloplasty, mitral valve replacement, sternotomy, less invasive options through lateral incision -She is interested in hearing about all options but less receptive to balloon valvuloplasty, would perform more of a permanent surgical approach -Recommend right and left heart catheterization With referral  to CT surgery for further discussion.  Paroxysmal tachycardia On warfarin, Recommend she could try metoprolol 100 twice daily Try taking 50 in the morning 100 night    COVID-19 Education: The signs and symptoms of COVID-19 were discussed with the patient and how to seek care for testing (follow up with PCP or arrange E-visit).  The importance of social distancing was discussed today.  Patient Risk:   After full review of this patients clinical status, I feel that they are at least moderate risk at this time.  Time:   Today, I have spent 35 minutes with the patient with telehealth technology discussing the cardiac and medical problems/diagnoses detailed above   Additional 10 min spent reviewing the chart prior to patient visit today   Medication Adjustments/Labs and Tests Ordered: Current medicines are reviewed at length with the patient today.  Concerns regarding medicines are outlined above.   Tests Ordered: No tests ordered   Medication Changes: No changes made   Disposition: Follow-up in 6 months   Signed, Ida Rogue, MD  [place] Office [address] [place] [address], [place], [place] [address]

## 2019-07-04 ENCOUNTER — Encounter: Payer: Self-pay | Admitting: Cardiovascular Disease

## 2019-07-04 ENCOUNTER — Telehealth (INDEPENDENT_AMBULATORY_CARE_PROVIDER_SITE_OTHER): Payer: Managed Care, Other (non HMO) | Admitting: Cardiovascular Disease

## 2019-07-04 ENCOUNTER — Other Ambulatory Visit: Payer: Self-pay

## 2019-07-04 ENCOUNTER — Telehealth: Payer: Self-pay | Admitting: Cardiovascular Disease

## 2019-07-04 VITALS — Ht 64.0 in | Wt 228.0 lb

## 2019-07-04 DIAGNOSIS — E7800 Pure hypercholesterolemia, unspecified: Secondary | ICD-10-CM

## 2019-07-04 DIAGNOSIS — I4891 Unspecified atrial fibrillation: Secondary | ICD-10-CM

## 2019-07-04 DIAGNOSIS — I050 Rheumatic mitral stenosis: Secondary | ICD-10-CM

## 2019-07-04 DIAGNOSIS — I5032 Chronic diastolic (congestive) heart failure: Secondary | ICD-10-CM | POA: Diagnosis not present

## 2019-07-04 DIAGNOSIS — F17200 Nicotine dependence, unspecified, uncomplicated: Secondary | ICD-10-CM

## 2019-07-04 DIAGNOSIS — I480 Paroxysmal atrial fibrillation: Secondary | ICD-10-CM

## 2019-07-04 DIAGNOSIS — Z01818 Encounter for other preprocedural examination: Secondary | ICD-10-CM

## 2019-07-04 NOTE — Telephone Encounter (Signed)
Spoke with patient about scheduling of her Right & Left Heart cath that Dr. Rockey Situ wants [place] to set up. She is wanting to wait until after July 4th due to her going out of town. Let her know that we would need some updated labs and COVID swab prior to that being done. Also let her know that I would put in referral for Dr. Roxy Manns as well. She reported that she would call me when she returns to get this set up. I will also set reminder as well. She was appreciative for the call with no further questions at this time.

## 2019-07-04 NOTE — Patient Instructions (Addendum)
Needs a right and left heart catheterization for preop evaluation for mitral valve stenosis, valve replacement surgery she wants to wait until after July 4th due to being out of town.  We can place referral to cardiothoracic surgery, Perhaps Dr. Roxy Manns who does minimally invasive mitral valve replacement surgery  Medication Instructions:  No changes  If you need a refill on your cardiac medications before your next appointment, please call your pharmacy.    Lab work: No new labs needed   If you have labs (blood work) drawn today and your tests are completely normal, you will receive your results only by: Marland Kitchen MyChart Message (if you have MyChart) OR . A paper copy in the mail If you have any lab test that is abnormal or we need to change your treatment, we will call you to review the results.   Testing/Procedures: [facility] Cardiac Cath Instructions   You are scheduled for a Cardiac Cath on:_________________________  Please arrive at _______am on the day of your procedure  Please expect a call from our [place] to pre-register you  Do not eat/drink anything after midnight  Someone will need to drive you home  It is recommended someone be with you for the first 24 hours after your procedure  Wear clothes that are easy to get on/off and wear slip on shoes if possible   Medications bring a current list of all medications with you  _XX__ Do not take these medications before your procedure: Fluid pill Torsemide or Potassium that morning. We will also advise on your coumadin as well.   _XX__ You may take all of your other medications the morning of your procedure with enough water to swallow safely   Day of your procedure: Arrive at the [facility] entrance.  Free valet service is available.  After entering the [facility] please check-in at the registration desk (1st desk on your right) to receive your armband. After receiving your armband someone will escort  you to the cardiac cath/special procedures waiting area.  The usual length of stay after your procedure is about 2 to 3 hours.  This can vary.  If you have any questions, please call our office at [phone], or you may call the cardiac cath lab at [facility] directly at [phone]    Follow-Up: At [facility], you and your health needs are our priority.  As part of our continuing mission to provide you with exceptional heart care, we have created designated Provider Care Teams.  These Care Teams include your primary Cardiologist (physician) and Advanced Practice Providers (APPs -  Physician Assistants and Nurse Practitioners) who all work together to provide you with the care you need, when you need it.  . You will need a follow up appointment in 6 months   . Providers on your designated Care Team:   . Murray Hodgkins, NP . Christell Faith, PA-C . Marrianne Mood, PA-C  Any Other Special Instructions Will Be Listed Below (If Applicable).  For educational health videos Log in to : [email or web address] Or : [email or web address], password : [email or web address]

## 2019-07-17 NOTE — Telephone Encounter (Signed)
Called and spoke with patient to let her know that I would give her a call on July 8th to schedule her procedure. She verbalized understanding with no further questions at this time.

## 2019-07-22 IMAGING — CT CT HEART MORP W/ CTA COR W/ SCORE W/ CA W/CM &/OR W/O CM
4 of 7 series · 9 of 20 positions shown, 10 images · IV contrast (APPLIED)
Comparison: Chest CT 02/18/2011 and chest radiograph 02/07/2018

Addendum:
EXAM:
OVER-READ INTERPRETATION  CT CHEST

The following report is an over-read performed by radiologist Dr.
Tal Linderman [[email or web address]] on 02/27/2018. This over-read
does not include interpretation of cardiac or coronary anatomy or
pathology. The coronary calcium score/coronary CTA interpretation by
the cardiologist is attached.
CLINICAL DATA: 52-year-old female with hyperlipidemia, obesity and
chest pain.
Cardiac/Coronary  CT
TECHNIQUE: The patient was scanned on a [employer] Force scanner.

[Series 7: best diast 70 % · axial · 0.34mm/px · z∈[-185,-114]mm · 3 of 356 slices shown, 4 images]
[im 89/356  vessel]
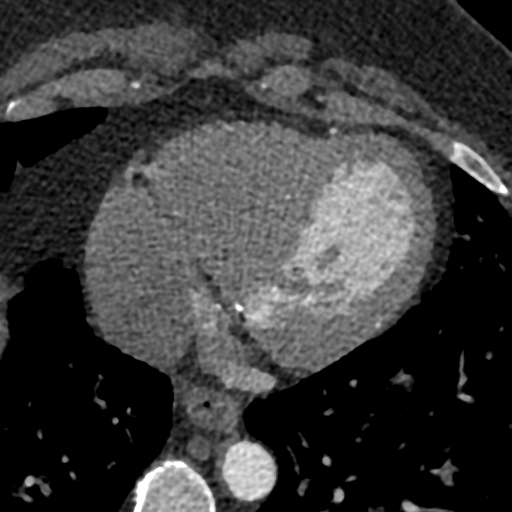
[im 89/356  lung]
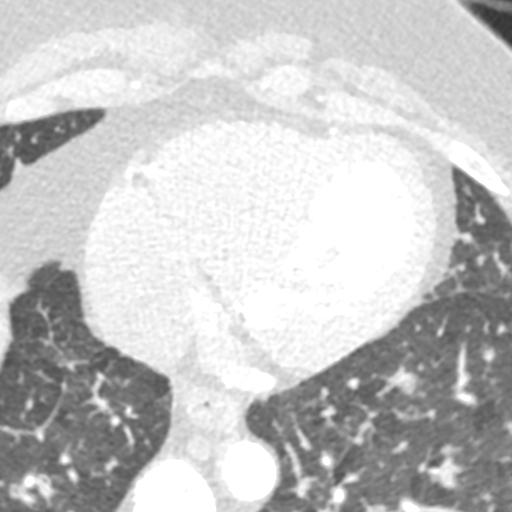
[im 178/356  vessel]
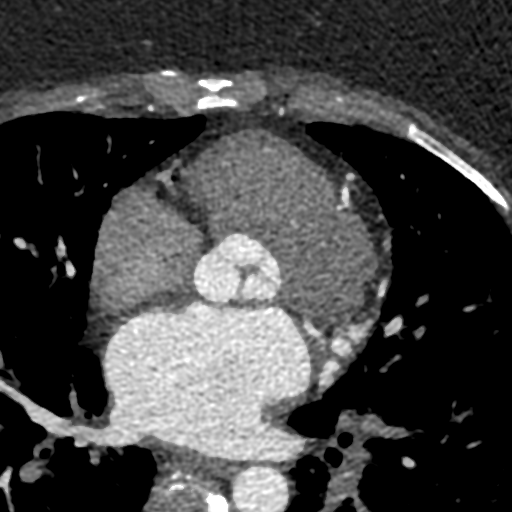
[im 267/356  vessel]
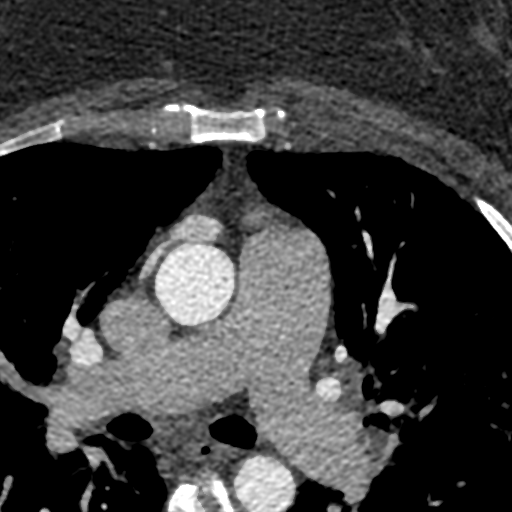

[Series 8: best syst 41 % · axial · 0.34mm/px · z∈[-182,-143]mm · 2 of 293 slices shown]
[im 98/293  vessel]
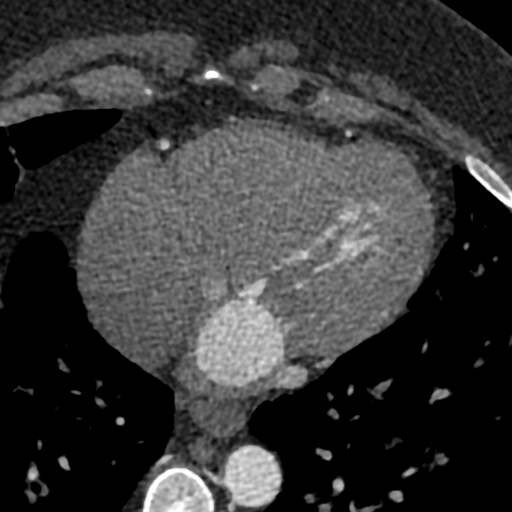
[im 195/293  vessel]
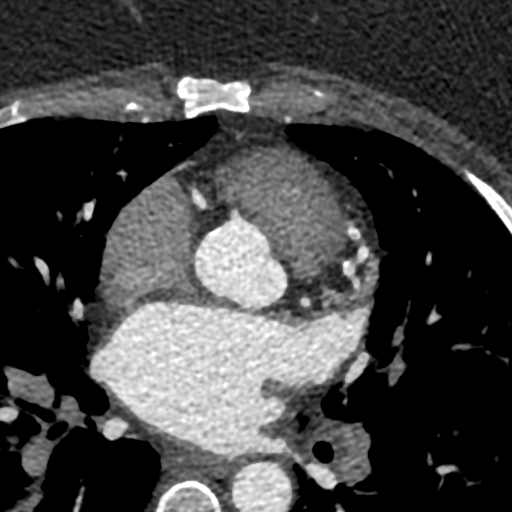

[Series 9: ts diast sharp 70 % · axial · 0.34mm/px · z∈[-182,-143]mm · 2 of 293 slices shown]
[im 98/293  lung]
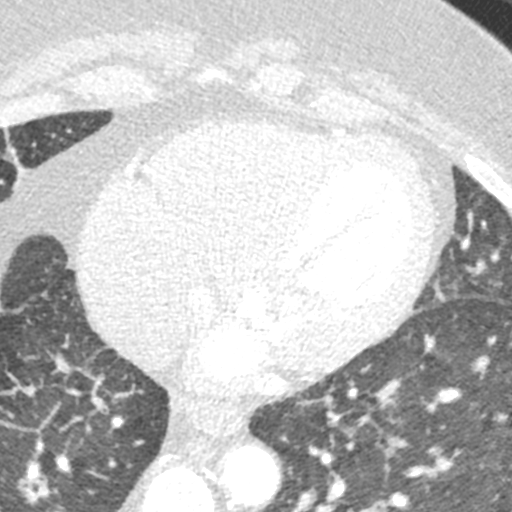
[im 195/293  lung]
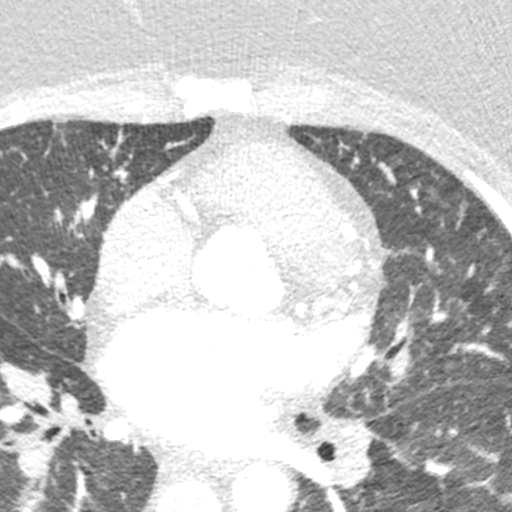

[Series 10: ts syst sharp 41 % · axial · 0.34mm/px · z∈[-182,-143]mm · 2 of 293 slices shown]
[im 98/293  lung]
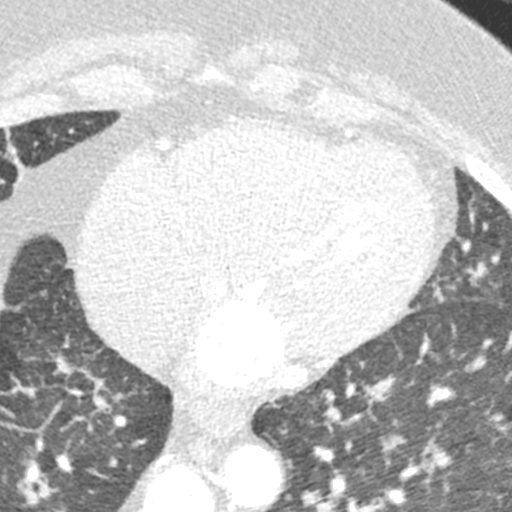
[im 195/293  lung]
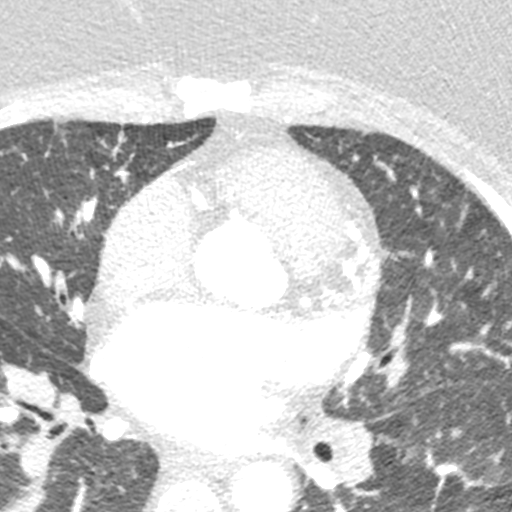

[9 of 20 positions shown; findings below may reference images not displayed]

FINDINGS: Vascular: Atherosclerotic calcifications involving the thoracic
aorta without aneurysm. Limited evaluation of the pulmonary
arteries. No significant pericardial fluid.

Mediastinum/Nodes: Soft tissue fullness in the hilar regions and
probably similar to the previous examination. Small subcarinal
nodes. Evidence for small hiatal hernia.

Lungs/Pleura: No large pleural effusions. There is a stable nodular
density in the right middle lobe measuring 3 mm on sequence 13,
image 13. Septal thickening at the lung bases. Concern for small
centrilobular densities in the left upper lobe but incompletely
characterized.

Upper Abdomen: Limited evaluation of the upper abdominal structures.

Musculoskeletal: No acute bone abnormality.
IMPRESSION: 1. Scattered areas of interstitial septal thickening, particularly
at the lung bases. Findings may be secondary to edema and/or
atelectasis. Nonspecific centrilobular densities in the left upper
lobe, cannot exclude an infectious etiology.
2. Mild soft tissue fullness in the hilar regions is nonspecific but
appears to be minimally changed from the previous examination.
3.  Aortic Atherosclerosis ([id number].E).
4. Small hiatal hernia.
FINDINGS: A 120 kV prospective scan was triggered in the descending thoracic
aorta at 111 HU's. Axial non-contrast 3 mm slices were carried out
through the heart. The data set was analyzed on a dedicated work
station and scored using the Agatson method. Gantry rotation speed
was 250 msecs and collimation was .6 mm. No beta blockade and 0.8 mg
of sl NTG was given. The 3D data set was reconstructed in 5%
intervals of the 67-82 % of the R-R cycle. Diastolic phases were
analyzed on a dedicated work station using MPR, MIP and VRT modes.
The patient received 80 cc of contrast.

Aorta: Normal size. Mild diffuse calcifications and atherosclerotic
plaque. No dissection.

Aortic Valve: Trileaflet. Mild leaflet thickening and trivial
calcifications.

Coronary Arteries:  Normal coronary origin.  Right dominance.

RCA is a large dominant artery that gives rise to PDA and PLVB.
There is trivial plaque.

Left main is a large artery that gives rise to LAD and LCX arteries.
Distal left main has minimal calcified plaque with stenosis 0-25%.

LAD is a large vessel with minimal calcified plaque and stenosis
0-25%.

D1 is a large vessel with no significant plaque.

D2 is a small branch with no significant plaque.

LCX is a non-dominant artery that gives rise to one large OM1
branch. There is only trivial plaque.

Other findings:

Normal pulmonary vein drainage into the left atrium.

Normal let atrial appendage without a thrombus.
IMPRESSION: 1. Coronary calcium score of 10. This was 89 percentile for age and
sex matched control.

2. Normal coronary origin with right dominance.

3. Mild non-obstructive CAD. Aggressive risk factor modification is
recommended.

4. Mild diffuse calcifications and atherosclerotic plaque.

5. Dilated pulmonary artery measuring 35 mm suggestive of pulmonary
hypertension.

*** End of Addendum ***

## 2019-07-27 ENCOUNTER — Encounter: Payer: Self-pay | Admitting: Nurse Practitioner

## 2019-07-27 ENCOUNTER — Ambulatory Visit (INDEPENDENT_AMBULATORY_CARE_PROVIDER_SITE_OTHER): Payer: Managed Care, Other (non HMO) | Admitting: Nurse Practitioner

## 2019-07-27 ENCOUNTER — Other Ambulatory Visit: Payer: Self-pay

## 2019-07-27 VITALS — BP 142/84 | Temp 96.6°F | Resp 16 | Ht 64.0 in | Wt 232.0 lb

## 2019-07-27 DIAGNOSIS — B3731 Acute candidiasis of vulva and vagina: Secondary | ICD-10-CM

## 2019-07-27 DIAGNOSIS — B373 Candidiasis of vulva and vagina: Secondary | ICD-10-CM | POA: Diagnosis not present

## 2019-07-27 DIAGNOSIS — J069 Acute upper respiratory infection, unspecified: Secondary | ICD-10-CM | POA: Diagnosis not present

## 2019-07-27 DIAGNOSIS — R05 Cough: Secondary | ICD-10-CM | POA: Diagnosis not present

## 2019-07-27 DIAGNOSIS — R059 Cough, unspecified: Secondary | ICD-10-CM

## 2019-07-27 MED ORDER — HYDROCOD POLST-CPM POLST ER 10-8 MG/5ML PO SUER: 5.0000 mL | Freq: Two times a day (BID) | ORAL | 0 refills | Status: DC | PRN

## 2019-07-27 MED ORDER — FLUCONAZOLE 150 MG PO TABS: ORAL_TABLET | ORAL | 0 refills | Status: DC

## 2019-07-27 MED ORDER — LEVOFLOXACIN 500 MG PO TABS: 500.0000 mg | ORAL_TABLET | Freq: Every day | ORAL | 0 refills | Status: DC

## 2019-07-27 NOTE — Progress Notes (Signed)
[facility] [place], [place] [address]  Internal MEDICINE  Telephone Visit  Patient Name: Leslie [name]  [id number]  [id number]  Date of Service: 08/05/2019  I connected with the patient at 4:33pm by telephone and verified the patients identity using two identifiers.   I discussed the limitations, risks, security and privacy concerns of performing an evaluation and management service by telephone and the availability of in person appointments. I also discussed with the patient that there may be a patient responsible charge related to the service.  The patient expressed understanding and agrees to proceed.    Chief Complaint  Patient presents with  . Telephone Screen    call cell [phone]  . Telephone Assessment  . Sinusitis  . Cough  . Wheezing    The patient has been contacted via telephone for follow up visit due to concerns for spread of novel coronavirus. Today, she states that she has had nasal congestion, sore throat, cough, and wheezing. Symptoms have been present for several days. They are gradually getting worse. She has also had a headache. She denies fever. She is unsure if she may have been exposed to COVID 19.       Current Medication: Outpatient Encounter Medications as of 07/27/2019  Medication Sig Note  . albuterol (VENTOLIN HFA) 108 (90 Base) MCG/ACT inhaler Inhale 2 puffs into the lungs every 4 (four) hours as needed for wheezing or shortness of breath.   . ALPRAZolam (XANAX) 0.5 MG tablet Take 1 tablet (0.5 mg total) by mouth at bedtime as needed for anxiety.   Marland Kitchen atorvastatin (LIPITOR) 80 MG tablet Take 80 mg by mouth daily.   Marland Kitchen diltiazem (CARDIZEM CD) 120 MG 24 hr capsule Take 1 capsule (120 mg total) by mouth daily.   . metoprolol succinate (TOPROL-XL) 50 MG 24 hr tablet Take 50-100 mg by mouth See admin instructions. Take 50 mg in the morning and 100 mg at night   . metroNIDAZOLE (METROGEL) 0.75 % vaginal gel Place 1  Applicatorful vaginally at bedtime. (Patient not taking: Reported on 07/31/2019)   . omeprazole (PRILOSEC) 40 MG capsule Take 1 capsule (40 mg total) by mouth daily.   . potassium chloride (KLOR-CON) 10 MEQ tablet Take 20-30 mEq by mouth See admin instructions. Take 30 meq in the morning and 20 meq at night   . torsemide (DEMADEX) 20 MG tablet Take 2 tablets (40 mg total) by mouth 2 (two) times daily.   Marland Kitchen warfarin (COUMADIN) 2 MG tablet TAKE AS DIRECTED BY COUMADIN CLINIC (Patient not taking: Reported on 07/31/2019)   . warfarin (COUMADIN) 2.5 MG tablet Take 0.5 tablets (1.25 mg total) by mouth See admin instructions. Take 0.5 tablet (1.25 mg) by mouth on Tuesdays & Thursdays. (Patient taking differently: Take 1.25 mg by mouth daily. )   . chlorpheniramine-HYDROcodone (TUSSIONEX PENNKINETIC ER) 10-8 MG/5ML SUER Take 5 mLs by mouth every 12 (twelve) hours as needed for cough.   . fluconazole (DIFLUCAN) 150 MG tablet Take 1 tablet po once. May repeat dose in 3 days as needed for persistent symptoms. (Patient taking differently: Take 150 mg by mouth once. Take 1 tablet po once. May repeat dose in 3 days as needed for persistent symptoms.)   . levofloxacin (LEVAQUIN) 500 MG tablet Take 1 tablet (500 mg total) by mouth daily. 07/31/2019: Unknown start/finish date   No facility-administered encounter medications on file as of 07/27/2019.    Surgical History: Past Surgical History:  Procedure Laterality Date  .  ABDOMINAL HYSTERECTOMY     total  . ABDOMINAL HYSTERECTOMY    . BREAST BIOPSY Bilateral 2012  . BREAST BIOPSY Right 08-07-12   fibroadenomatous changes and columnar cells  . BREAST BIOPSY  02/03/2015   stereo byrnett  . CHOLECYSTECTOMY N/A 02/27/2016   Procedure: LAPAROSCOPIC CHOLECYSTECTOMY;  Surgeon: Christene Lye, MD;  Location: [facility] ORS;  Service: General;  Laterality: N/A;  . COLONOSCOPY WITH PROPOFOL N/A 09/26/2018   Procedure: COLONOSCOPY WITH PROPOFOL;  Surgeon: Lucilla Lame, MD;   Location: [facility] END[facility];  Service: Endoscopy;  Laterality: N/A;  . CYSTOSCOPY W/ RETROGRADES Right 08/18/2018   Procedure: CYSTOSCOPY WITH RETROGRADE PYELOGRAM;  Surgeon: Billey Co, MD;  Location: [facility] OR[facility];  Service: Urology;  Laterality: Right;  . CYSTOSCOPY/URETEROSCOPY/HOLMIUM LASER/STENT PLACEMENT Right 08/18/2018   Procedure: CYSTOSCOPY/URETEROSCOPY/STENT PLACEMENT;  Surgeon: Billey Co, MD;  Location: [facility];  Service: Urology;  Laterality: Right;  . ESOPHAGOGASTRODUODENOSCOPY (EGD) WITH PROPOFOL N/A 09/26/2018   Procedure: ESOPHAGOGASTRODUODENOSCOPY (EGD) WITH PROPOFOL;  Surgeon: Lucilla Lame, MD;  Location: [facility];  Service: Endoscopy;  Laterality: N/A;  . GIVENS CAPSULE STUDY N/A 11/03/2018   Procedure: GIVENS CAPSULE STUDY;  Surgeon: Lucilla Lame, MD;  Location: [facility];  Service: Endoscopy;  Laterality: N/A;  . JOINT REPLACEMENT Left    knee  . KNEE ARTHROPLASTY Right 04/09/2019   Procedure: COMPUTER ASSISTED TOTAL KNEE ARTHROPLASTY;  Surgeon: Dereck Leep, MD;  Location: [facility];  Service: Orthopedics;  Laterality: Right;  . KNEE CLOSED REDUCTION Left 04/15/2015   Procedure: CLOSED MANIPULATION KNEE;  Surgeon: Thornton Park, MD;  Location: [facility];  Service: Orthopedics;  Laterality: Left;  . KNEE SURGERY    . TEE WITHOUT CARDIOVERSION N/A 09/13/2017   Procedure: TRANSESOPHAGEAL ECHOCARDIOGRAM (TEE);  Surgeon: Nelva Bush, MD;  Location: [facility];  Service: Cardiovascular;  Laterality: N/A;  . TOTAL KNEE ARTHROPLASTY Left 12/25/2014   Procedure: TOTAL KNEE ARTHROPLASTY;  Surgeon: Thornton Park, MD;  Location: [facility];  Service: Orthopedics;  Laterality: Left;  . TUBAL LIGATION      Medical History: Past Medical History:  Diagnosis Date  . (HFpEF) heart failure with preserved ejection fraction ([place])    a. 08/2017 Echo: EF 55-60%.  Grade 2 diastolic dysfunction.  Moderate mitral stenosis.  . Allergy   . Anemia   . Anxiety   . BRCA negative  03/22/2013  . Bronchitis 02/19/2016   ON LEVAQUIN PO  . Chest tightness   . Cigarette smoker 09/11/2017   8-10 day  . Dyspnea   . GERD (gastroesophageal reflux disease)   . History of kidney stones   . Hyperlipidemia   . Hypertension   . Interstitial lung disease ([place])    a. CT 2013 b. 02/2018 CXR noted recurrent intersistial changes ILD vs chronic bronchitis  . Moderate mitral stenosis    a.  08/2017 TEE: EF 60 to 65%.  Moderate mitral stenosis.  Mean gradient 14 mmHg.  Valve area 2.59 cm by planimetry, 2.72 cm by pressure half-time.  . Persistent atrial fibrillation ([place])    a. 08/2017 s/p TEE/DCCV; b. CHA2DS2VASc = 2-->warfarin.    Family History: Family History  Problem Relation Age of Onset  . Cancer Mother 82       breast  . Hypertension Mother   . Cancer Maternal Aunt        breast  . Cancer Maternal Grandmother        breast  . Osteoarthritis Father   . Hypertension Father     Social  History   Socioeconomic History  . Marital status: Married    Spouse name: Not on file  . Number of children: Not on file  . Years of education: Not on file  . Highest education level: Not on file  Occupational History  . Not on file  Tobacco Use  . Smoking status: Former Smoker    Packs/day: 0.50    Years: 20.00    Pack years: 10.00    Types: Cigarettes    Quit date: 02/20/2018    Years since quitting: 1.4  . Smokeless tobacco: Never Used  Vaping Use  . Vaping Use: Never used  Substance and Sexual Activity  . Alcohol use: Yes    Comment: rarely  . Drug use: No  . Sexual activity: Not on file  Other Topics Concern  . Not on file  Social History Narrative  . Not on file   Social Determinants of Health   Financial Resource Strain:   . Difficulty of Paying Living Expenses:   Food Insecurity:   . Worried About Charity fundraiser in the Last Year:   . Arboriculturist in the Last Year:   Transportation Needs:   . Film/video editor (Medical):   Marland Kitchen Lack of  Transportation (Non-Medical):   Physical Activity:   . Days of Exercise per Week:   . Minutes of Exercise per Session:   Stress:   . Feeling of Stress :   Social Connections:   . Frequency of Communication with Friends and Family:   . Frequency of Social Gatherings with Friends and Family:   . Attends Religious Services:   . Active Member of Clubs or Organizations:   . Attends Archivist Meetings:   Marland Kitchen Marital Status:   Intimate Partner Violence:   . Fear of Current or Ex-Partner:   . Emotionally Abused:   Marland Kitchen Physically Abused:   . Sexually Abused:       Review of Systems  Constitutional: Positive for appetite change, chills and fatigue. Negative for fever.  HENT: Positive for congestion, postnasal drip, rhinorrhea, sinus pain, sore throat and voice change. Negative for ear pain.   Respiratory: Positive for cough and wheezing.   Cardiovascular: Negative for chest pain and palpitations.  Gastrointestinal: Positive for nausea.  Musculoskeletal: Positive for arthralgias and myalgias.       Generalized muscle pain  Allergic/Immunologic: Positive for environmental allergies.  Neurological: Positive for headaches.  Hematological: Positive for adenopathy.    Today's Vitals   07/27/19 1607  BP: (!) 142/84  Resp: 16  Temp: (!) 96.6 F (35.9 C)  Weight: 232 lb (105.2 kg)  Height: _0  (1.626 m)   Body mass index is 39.82 kg/m.  Observation/Objective:    The patient is alert and oriented. She is pleasant and answers all questions appropriately. Breathing is non-labored. She is in no acute distress at this time. The patient has moderate nasal congestion. She also has deep, congested sounding cough.   Assessment/Plan:  1. Acute upper respiratory infection Start levofloxacin 545m daily for 10 days. Rest and increase fluids. Use OTC medications as needed and as indicated to relieve acute symptoms. Advised her to go for COVID 19 testing if symptoms not improved over  next three to five days. In meantime, she should follow isolation guidelines for suspected exposure to COVID 19. She voiced understanding and agreement with the plan.  - levofloxacin (LEVAQUIN) 500 MG tablet; Take 1 tablet (500 mg total) by mouth daily.  Dispense: 10 tablet; Refill: 0  2. Cough May take tussionex twice daily as needed for cough. Advised patient not to overuse this medicine and not to mix with other medications or alcohol as it can cause respiratory distress, sleepiness or dizziness. Should also avoid driving. Patient voiced understanding and agreement.  - chlorpheniramine-HYDROcodone (TUSSIONEX PENNKINETIC ER) 10-8 MG/5ML SUER; Take 5 mLs by mouth every 12 (twelve) hours as needed for cough.  Dispense: 115 mL; Refill: 0  3. Vaginal candidiasis May take diflucan 143m if yeast infection develops.  - fluconazole (DIFLUCAN) 150 MG tablet; Take 1 tablet po once. May repeat dose in 3 days as needed for persistent symptoms. (Patient taking differently: Take 150 mg by mouth once. Take 1 tablet po once. May repeat dose in 3 days as needed for persistent symptoms.)  Dispense: 3 tablet; Refill: 0   General Counseling: Titania verbalizes understanding of the findings of today's phone visit and agrees with plan of treatment. I have discussed any further diagnostic evaluation that may be needed or ordered today. We also reviewed her medications today. she has been encouraged to call the office with any questions or concerns that should arise related to todays visit.   This patient [place] FLavera Guiseas a part of collaborative care agreement  Meds ordered this encounter  Medications  . levofloxacin (LEVAQUIN) 500 MG tablet    Sig: Take 1 tablet (500 mg total) by mouth daily.    Dispense:  10 tablet    Refill:  0    I understand that levofloxacin and warfarin interact. Patient has INR checked weekly and will adjust warfarin dosing accordingly.     Order Specific Question:   Supervising Provider    Answer:   KLavera Guise[[[id number]] . fluconazole (DIFLUCAN) 150 MG tablet    Sig: Take 1 tablet po once. May repeat dose in 3 days as needed for persistent symptoms.    Dispense:  3 tablet    Refill:  0    Order Specific Question:   Supervising Provider    Answer:   KLavera Guise[[[id number]] . chlorpheniramine-HYDROcodone (TUSSIONEX PENNKINETIC ER) 10-8 MG/5ML SUER    Sig: Take 5 mLs by mouth every 12 (twelve) hours as needed for cough.    Dispense:  115 mL    Refill:  0    Order Specific Question:   Supervising Provider    Answer:   KLavera Guise[[[id number]]   Time spent: 220Minutes    Dr FLavera GuiseInternal medicine

## 2019-07-30 ENCOUNTER — Telehealth: Payer: Self-pay | Admitting: Cardiovascular Disease

## 2019-07-30 NOTE — Telephone Encounter (Signed)
Spoke with patient and reviewed all information regarding procedure scheduled for this Friday. Instructed patient to hold warfarin starting today and we would cancel her coumadin appointment. Also reviewed need for labs and COVID swab on Wednesday. She verbalized understanding of our conversation, agreement with plan, and had no further questions at this time.

## 2019-07-30 NOTE — Telephone Encounter (Signed)
Patient husband states pt is having a procedure on Friday, and he has tested positive for COVID virus. Please call to discuss if patient should r/s her procedure.

## 2019-07-30 NOTE — Telephone Encounter (Signed)
Spoke with patient and discussed her procedure. She wanted to schedule for this Friday and reviewed that I would get that scheduled and call her back with time and information for testing. She verbalized understanding with no further questions at this time.

## 2019-07-30 NOTE — Telephone Encounter (Signed)
Spoke w/ pt.  Advised her to hold her warfarin as advised, and to take extra 1/2 tablet on Friday & Saturday (1 whole tablet) if ok'd by MD after her procedure, then resume previous dosage. Next INR check sched for 07/16/19. She verbalized understanding and will call back w/ any questions or concerns.

## 2019-07-30 NOTE — Telephone Encounter (Signed)
Spoke with patient and reviewed that we can proceed with her procedure and testing. If she is negative then she can have it done and if not then we can reschedule at another time. Checked with PAT and with provider Dr. Rockey Situ and they both agreed if her test is negative then she can have procedure done as planned. She verbalized understanding and had no further questions at this time.

## 2019-08-01 ENCOUNTER — Other Ambulatory Visit
Admission: RE | Admit: 2019-08-01 | Discharge: 2019-08-01 | Disposition: A | Discharge status: Home / Self Care | Payer: Managed Care, Other (non HMO) | Source: Ambulatory Visit | Attending: Cardiovascular Disease | Admitting: Cardiovascular Disease

## 2019-08-01 ENCOUNTER — Other Ambulatory Visit: Payer: Self-pay

## 2019-08-01 ENCOUNTER — Other Ambulatory Visit
Admission: RE | Admit: 2019-08-01 | Discharge: 2019-08-01 | Disposition: A | Discharge status: Home / Self Care | Payer: Managed Care, Other (non HMO) | Source: Home / Self Care | Attending: Cardiovascular Disease | Admitting: Cardiovascular Disease

## 2019-08-01 DIAGNOSIS — Z01818 Encounter for other preprocedural examination: Secondary | ICD-10-CM | POA: Insufficient documentation

## 2019-08-01 DIAGNOSIS — U071 COVID-19: Secondary | ICD-10-CM | POA: Diagnosis not present

## 2019-08-01 DIAGNOSIS — I050 Rheumatic mitral stenosis: Secondary | ICD-10-CM | POA: Diagnosis not present

## 2019-08-01 LAB — BASIC METABOLIC PANEL
Anion gap: 9 (ref 5–15)
BUN: 18 mg/dL (ref 6–20)
CO2: 31 mmol/L (ref 22–32)
Calcium: 8.8 mg/dL — ABNORMAL LOW (ref 8.9–10.3)
Chloride: 96 mmol/L — ABNORMAL LOW (ref 98–111)
Creatinine, Ser: 1.49 mg/dL — ABNORMAL HIGH (ref 0.44–1.00)
GFR calc Af Amer: 46 mL/min — ABNORMAL LOW (ref >60)
GFR calc non Af Amer: 40 mL/min — ABNORMAL LOW (ref >60)
Glucose, Bld: 110 mg/dL — ABNORMAL HIGH (ref 70–99)
Potassium: 3.4 mmol/L — ABNORMAL LOW (ref 3.5–5.1)
Sodium: 136 mmol/L (ref 135–145)

## 2019-08-01 LAB — CBC
HCT: 32.9 % — ABNORMAL LOW (ref 36.0–46.0)
Hemoglobin: 11 g/dL — ABNORMAL LOW (ref 12.0–15.0)
MCH: 28 pg (ref 26.0–34.0)
MCHC: 33.4 g/dL (ref 30.0–36.0)
MCV: 83.7 fL (ref 80.0–100.0)
Platelets: 339 10*3/uL (ref 150–400)
RBC: 3.93 MIL/uL (ref 3.87–5.11)
RDW: 14 % (ref 11.5–15.5)
WBC: 4.3 10*3/uL (ref 4.0–10.5)
nRBC: 0 % (ref 0.0–0.2)

## 2019-08-01 LAB — PROTIME-INR
INR: 1.5 — ABNORMAL HIGH (ref 0.8–1.2)
Prothrombin Time: 17.6 seconds — ABNORMAL HIGH (ref 11.4–15.2)

## 2019-08-01 LAB — SARS CORONAVIRUS 2 (TAT 6-24 HRS): SARS Coronavirus 2: POSITIVE — AB

## 2019-08-02 ENCOUNTER — Telehealth: Payer: Self-pay | Admitting: Physician Assistant

## 2019-08-02 ENCOUNTER — Telehealth: Payer: Self-pay | Admitting: Cardiovascular Disease

## 2019-08-02 NOTE — Telephone Encounter (Signed)
Patient called in due to positive COVID test. Advised that we would need to cancel her procedure for now and that I would let provider know as well. Discussed that we would need to reschedule at a later date and that I would check with scheduling for when to reschedule. She verbalized understanding with no further questions at this time. Scheduling notified to cancel.

## 2019-08-02 NOTE — Telephone Encounter (Signed)
  Called to discuss with patient about Covid symptoms and the use of a monoclonal antibody infusion for those with mild to moderate Covid symptoms and at a high risk of hospitalization.   Message left to call back our hotline [phone].  Leanor Kail, PA - C

## 2019-08-03 ENCOUNTER — Ambulatory Visit
Admission: RE | Admit: 2019-08-03 | Payer: Managed Care, Other (non HMO) | Source: Home / Self Care | Admitting: Cardiovascular Disease

## 2019-08-03 ENCOUNTER — Other Ambulatory Visit: Payer: Self-pay | Admitting: Nurse Practitioner

## 2019-08-03 ENCOUNTER — Telehealth: Payer: Self-pay | Admitting: Nurse Practitioner

## 2019-08-03 ENCOUNTER — Encounter: Admission: RE | Payer: Self-pay | Source: Home / Self Care

## 2019-08-03 DIAGNOSIS — U071 COVID-19: Secondary | ICD-10-CM

## 2019-08-03 DIAGNOSIS — I10 Essential (primary) hypertension: Secondary | ICD-10-CM

## 2019-08-03 DIAGNOSIS — N183 Chronic kidney disease, stage 3 unspecified: Secondary | ICD-10-CM

## 2019-08-03 SURGERY — RIGHT/LEFT HEART CATH AND CORONARY ANGIOGRAPHY
Anesthesia: Moderate Sedation

## 2019-08-03 MED ORDER — SODIUM CHLORIDE 0.9 % IV SOLN
Freq: Once | INTRAVENOUS | Status: AC
  Filled 2019-08-03: qty 600

## 2019-08-03 NOTE — Progress Notes (Signed)
I connected by phone with Leslie [name] on 08/03/2019 at 11:04 AM to discuss the potential use of a new treatment for mild to moderate COVID-19 viral infection in non-hospitalized patients.  This patient is a 54 y.o. female that meets the FDA criteria for Emergency Use Authorization of COVID monoclonal antibody casirivimab/imdevimab.  Has a (+) direct SARS-CoV-2 viral test result  Has mild or moderate COVID-19   Is NOT hospitalized due to COVID-19  Is within 10 days of symptom onset  Has at least one of the high risk factor(s) for progression to severe COVID-19 and/or hospitalization as defined in EUA.  Specific high risk criteria : Cardiovascular disease or hypertension, CKD, BMI >25.    I have spoken and communicated the following to the patient or parent/caregiver regarding COVID monoclonal antibody treatment:  1. FDA has authorized the emergency use for the treatment of mild to moderate COVID-19 in adults and pediatric patients with positive results of direct SARS-CoV-2 viral testing who are 79 years of age and older weighing at least 40 kg, and who are at high risk for progressing to severe COVID-19 and/or hospitalization.  2. The significant known and potential risks and benefits of COVID monoclonal antibody, and the extent to which such potential risks and benefits are unknown.  3. Information on available alternative treatments and the risks and benefits of those alternatives, including clinical trials.  4. Patients treated with COVID monoclonal antibody should continue to self-isolate and use infection control measures (e.g., wear mask, isolate, social distance, avoid sharing personal items, clean and disinfect "high touch" surfaces, and frequent handwashing) according to CDC guidelines.   5. The patient or parent/caregiver has the option to accept or refuse COVID monoclonal antibody treatment.  After reviewing this information with the patient, The patient agreed to proceed with  receiving casirivimab\imdevimab infusion and will be provided a copy of the Fact sheet prior to receiving the infusion. [name] 08/03/2019 11:04 AM

## 2019-08-03 NOTE — Telephone Encounter (Signed)
Called to discuss with Leslie [name] about Covid symptoms and the use of  casirivimab/imdevimab, a combination monoclonal antibody infusion for those with mild to moderate Covid symptoms and at a high risk of hospitalization.     Pt is qualified for this infusion at the [facility] infusion center due to co-morbid conditions and/or a member of an at-risk group (hypertension, BMI >35, CHF, CKD).   Patient reports symptom onset 07/29/19 with cough, congestion, fever. She verbalized interest in proceeding with infusion and understanding of instructions.   Appointment scheduled for 08/04/19 @ 1230pm. Mychart confirmation sent.    Patient Active Problem List   Diagnosis Date Noted  . 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 Encounter for general adult medical examination with abnormal findings 02/21/2019  . Oral candidiasis 02/21/2019  . Acute kidney failure, unspecified ([place]) 12/09/2018  . Right knee pain 11/27/2018  . GERD (gastroesophageal reflux disease) 10/25/2018  . CKD (chronic kidney disease), stage III 10/25/2018  . CHF (congestive heart failure) ([place]) 10/25/2018  . Pyelonephritis 10/01/2018  . Absolute anemia 10/01/2018  . Iron deficiency anemia secondary to blood loss (chronic)   . Polyp of descending colon   . Acute on chronic heart failure with preserved ejection fraction (HFpEF) ([place])   . AKI (acute kidney injury) ([place])   . Sepsis ([place]) 08/26/2018  . (HFpEF) heart failure with preserved ejection fraction ([place])   . Renal calculus, right 07/03/2018  . Low back pain 06/27/2018  . Bacterial vaginitis 06/27/2018  . Pedal edema 06/27/2018  . Coronary artery disease, non-occlusive 04/10/2018  . Acute on chronic diastolic CHF (congestive heart failure) ([place]) 04/10/2018  . Encounter for other preprocedural examination 03/16/2018  . Community acquired pneumonia 02/16/2018  . Elevated brain natriuretic peptide (BNP) level 02/16/2018  . Abnormal renal function 02/16/2018  . Acute  recurrent pansinusitis 01/13/2018  . Candidiasis 01/13/2018  . Anxiety 01/13/2018  . Osteoarthritis of knee 01/09/2018  . Morbid obesity with body mass index (BMI) of 40.0 or higher ([place]) 11/13/2017  . Major depressive disorder 09/22/2017  . Mitral valve stenosis   . Atrial fibrillation ([place]) 09/11/2017  . Generalized abdominal pain 09/08/2017  . Episode of recurrent major depressive disorder ([place]) 09/08/2017  . Hypertension 06/06/2017  . Cough 06/06/2017  . Tobacco dependency 06/06/2017  . Urinary tract infection with hematuria 04/28/2017  . Acute upper respiratory infection 04/28/2017  . Flu-like symptoms 04/28/2017  . Dysuria 04/28/2017  . Vaginal candidiasis 04/28/2017  . Pain of left lower extremity 02/11/2016  . 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 Fibrocystic breast disease 03/23/2013  . Family history of breast cancer 07/20/2012    Alda Lea, AGPCNP-BC Pager: [phone] Amion: Bjorn Pippin

## 2019-08-04 ENCOUNTER — Ambulatory Visit (HOSPITAL_COMMUNITY)
Admission: RE | Admit: 2019-08-04 | Discharge: 2019-08-04 | Disposition: A | Discharge status: Home / Self Care | Payer: Managed Care, Other (non HMO) | Source: Ambulatory Visit | Attending: Pulmonary Disease | Admitting: Pulmonary Disease

## 2019-08-04 DIAGNOSIS — U071 COVID-19: Secondary | ICD-10-CM | POA: Diagnosis present

## 2019-08-04 DIAGNOSIS — I10 Essential (primary) hypertension: Secondary | ICD-10-CM

## 2019-08-04 DIAGNOSIS — N183 Chronic kidney disease, stage 3 unspecified: Secondary | ICD-10-CM | POA: Diagnosis present

## 2019-08-04 MED ORDER — DIPHENHYDRAMINE HCL 50 MG/ML IJ SOLN: 50.0000 mg | Freq: Once | INTRAMUSCULAR | Status: DC | PRN

## 2019-08-04 MED ORDER — EPINEPHRINE 0.3 MG/0.3ML IJ SOAJ: 0.3000 mg | Freq: Once | INTRAMUSCULAR | Status: DC | PRN

## 2019-08-04 MED ORDER — SODIUM CHLORIDE 0.9 % IV SOLN: INTRAVENOUS | Status: DC | PRN

## 2019-08-04 MED ORDER — ALBUTEROL SULFATE HFA 108 (90 BASE) MCG/ACT IN AERS: 2.0000 | INHALATION_SPRAY | Freq: Once | RESPIRATORY_TRACT | Status: DC | PRN

## 2019-08-04 MED ORDER — FAMOTIDINE IN NACL 20-0.9 MG/50ML-% IV SOLN: 20.0000 mg | Freq: Once | INTRAVENOUS | Status: DC | PRN

## 2019-08-04 MED ORDER — METHYLPREDNISOLONE SODIUM SUCC 125 MG IJ SOLR: 125.0000 mg | Freq: Once | INTRAMUSCULAR | Status: DC | PRN

## 2019-08-04 NOTE — Discharge Instructions (Signed)

## 2019-08-04 NOTE — Progress Notes (Signed)
  Diagnosis: COVID-19  Physician: Dr. Joya Gaskins  Procedure: Covid Infusion Clinic Med: casirivimab\imdevimab infusion - Provided patient with casirivimab\imdevimab fact sheet for patients, parents and caregivers prior to infusion.  Complications: No immediate complications noted.  Discharge: Discharged home   [place] 08/04/2019

## 2019-08-07 ENCOUNTER — Telehealth: Payer: Self-pay | Admitting: Cardiovascular Disease

## 2019-08-07 NOTE — Telephone Encounter (Signed)
Patient is calling to see when she is will be able to reschedule card cath due to prior covid positive   Please advise

## 2019-08-07 NOTE — Telephone Encounter (Signed)
Left voicemail message to call back  

## 2019-08-08 NOTE — Telephone Encounter (Signed)
Left voicemail message to call back  

## 2019-08-08 NOTE — Telephone Encounter (Signed)
Spoke with patient and she wanted to know when we could reschedule her procedure. Reviewed that once her symptoms resolve then we can rescheduled. Inquired if she was still having any symptoms and she does continue to have some congestion. She states that she will give [place] a call once that congestion gets better and we will definitely get her rescheduled. She was appreciative for the information with no further questions at this time.

## 2019-08-13 ENCOUNTER — Encounter: Payer: Managed Care, Other (non HMO) | Admitting: Thoracic Surgery (Cardiothoracic Vascular Surgery)

## 2019-08-13 ENCOUNTER — Telehealth: Payer: Self-pay | Admitting: Cardiovascular Disease

## 2019-08-13 NOTE — Telephone Encounter (Signed)
Patient is calling back to reschedule her procedure

## 2019-08-13 NOTE — Telephone Encounter (Signed)
error 

## 2019-08-14 ENCOUNTER — Encounter: Payer: Self-pay | Admitting: Nurse Practitioner

## 2019-08-14 ENCOUNTER — Ambulatory Visit: Payer: Managed Care, Other (non HMO) | Admitting: Nurse Practitioner

## 2019-08-14 VITALS — BP 126/66 | HR 53 | Resp 16 | Ht 64.0 in

## 2019-08-14 DIAGNOSIS — J069 Acute upper respiratory infection, unspecified: Secondary | ICD-10-CM

## 2019-08-14 DIAGNOSIS — U071 COVID-19: Secondary | ICD-10-CM | POA: Diagnosis not present

## 2019-08-14 DIAGNOSIS — I480 Paroxysmal atrial fibrillation: Secondary | ICD-10-CM

## 2019-08-14 DIAGNOSIS — B3731 Acute candidiasis of vulva and vagina: Secondary | ICD-10-CM

## 2019-08-14 DIAGNOSIS — B373 Candidiasis of vulva and vagina: Secondary | ICD-10-CM | POA: Diagnosis not present

## 2019-08-14 MED ORDER — FLUCONAZOLE 150 MG PO TABS: ORAL_TABLET | ORAL | 1 refills | Status: DC

## 2019-08-14 MED ORDER — LEVOFLOXACIN 500 MG PO TABS: 500.0000 mg | ORAL_TABLET | Freq: Every day | ORAL | 0 refills | Status: DC

## 2019-08-14 NOTE — Progress Notes (Signed)
[facility] [employer], [place] [address]  Internal MEDICINE  Telephone Visit  Patient Name: Leslie [name]  [id number]  [id number]  Date of Service: 09/09/2019  I connected with the patient at 5:05pm by telephone and verified the patients identity using two identifiers.   I discussed the limitations, risks, security and privacy concerns of performing an evaluation and management service by telephone and the availability of in person appointments. I also discussed with the patient that there may be a patient responsible charge related to the service.  The patient expressed understanding and agrees to proceed.    Chief Complaint  Patient presents with  . Telephone Assessment  . Telephone Screen  . Back Pain    Lower back pain, mild burning/itching     The patient has been contacted via telephone for follow up visit due to concerns for spread of novel coronavirus. She presents for a sick visit today.  - diagnosed with COVID 19 on July 31, 2019 -received infusion 08/04/2019 -has been in quarantine since 07/31/2019 -had to cancel cardiac catheterization and follow up with CT surgeon for cardiac valve replacement due to COVID 19 -is expected to return to work, at workplace next week. Has been successfully working remotely for past 1.5 years. Workplace has had several other employees positive for [place] 19. Employer unwilling to make employees mask and will not ask if they have or have not been vaccinated. Patient feeling uncomfortable with this and would like to continue working remotely.  -has not been contacted per health department regarding quarantine procedures and return to work protocols. She, her husband, and her father all positive for COVID 85. She states that none have been contacted per health department.  - is candidate to continue remote working accomodation. Advised her I would fill out necessary paperwork to make this possible for her.   - chronic a-fib  -  problematic heart valve which will need surgical repair.  -has burning with urination, vaginal itching, and low back pain, similar to symptoms she had in the past, when diagnosed with UTI.       Current Medication: Outpatient Encounter Medications as of 08/14/2019  Medication Sig Note  . acetaminophen (TYLENOL) 500 MG tablet Take 1,000 mg by mouth every 6 (six) hours as needed for moderate pain or fever.   Marland Kitchen atorvastatin (LIPITOR) 80 MG tablet Take 80 mg by mouth daily.   Marland Kitchen diltiazem (CARDIZEM CD) 120 MG 24 hr capsule Take 1 capsule (120 mg total) by mouth daily.   Marland Kitchen levofloxacin (LEVAQUIN) 500 MG tablet Take 1 tablet (500 mg total) by mouth daily. (Patient not taking: Reported on 09/06/2019)   . metoprolol succinate (TOPROL-XL) 50 MG 24 hr tablet Take 50-100 mg by mouth See admin instructions. Take 50 mg in the morning and 100 mg at night   . omeprazole (PRILOSEC) 40 MG capsule Take 1 capsule (40 mg total) by mouth daily.   . potassium chloride (KLOR-CON) 10 MEQ tablet Take 20-30 mEq by mouth See admin instructions. Take 30 meq in the morning and 20 meq at night   . torsemide (DEMADEX) 20 MG tablet Take 2 tablets (40 mg total) by mouth 2 (two) times daily.   Marland Kitchen warfarin (COUMADIN) 2 MG tablet TAKE AS DIRECTED BY COUMADIN CLINIC   . warfarin (COUMADIN) 2.5 MG tablet Take 0.5 tablets (1.25 mg total) by mouth See admin instructions. Take 0.5 tablet (1.25 mg) by mouth on Tuesdays & Thursdays. (Patient taking differently: Take 1.25 mg  by mouth daily. )   . [DISCONTINUED] albuterol (VENTOLIN HFA) 108 (90 Base) MCG/ACT inhaler Inhale 2 puffs into the lungs every 4 (four) hours as needed for wheezing or shortness of breath. (Patient not taking: Reported on 08/29/2019)   . [DISCONTINUED] ALPRAZolam (XANAX) 0.5 MG tablet Take 1 tablet (0.5 mg total) by mouth at bedtime as needed for anxiety.   . [DISCONTINUED] chlorpheniramine-HYDROcodone (TUSSIONEX PENNKINETIC ER) 10-8 MG/5ML SUER Take 5 mLs by mouth every  12 (twelve) hours as needed for cough. (Patient not taking: Reported on 08/29/2019)   . [DISCONTINUED] fluconazole (DIFLUCAN) 150 MG tablet Take 1 tablet po once. May repeat dose in 3 days as needed for persistent symptoms. (Patient taking differently: Take 150 mg by mouth once. Take 1 tablet po once. May repeat dose in 3 days as needed for persistent symptoms.)   . [DISCONTINUED] fluconazole (DIFLUCAN) 150 MG tablet Take 1 tablet po once. May repeat dose in 3 days as needed for persistent symptoms. (Patient not taking: Reported on 08/29/2019)   . [DISCONTINUED] levofloxacin (LEVAQUIN) 500 MG tablet Take 1 tablet (500 mg total) by mouth daily. 07/31/2019: Unknown start/finish date  . [DISCONTINUED] metroNIDAZOLE (METROGEL) 0.75 % vaginal gel Place 1 Applicatorful vaginally at bedtime. (Patient not taking: Reported on 08/29/2019)    No facility-administered encounter medications on file as of 08/14/2019.    Surgical History: Past Surgical History:  Procedure Laterality Date  . ABDOMINAL HYSTERECTOMY     total  . ABDOMINAL HYSTERECTOMY    . BREAST BIOPSY Bilateral 2012  . BREAST BIOPSY Right 08-07-12   fibroadenomatous changes and columnar cells  . BREAST BIOPSY  02/03/2015   stereo byrnett  . CHOLECYSTECTOMY N/A 02/27/2016   Procedure: LAPAROSCOPIC CHOLECYSTECTOMY;  Surgeon: Christene Lye, MD;  Location: [facility] ORS;  Service: General;  Laterality: N/A;  . COLONOSCOPY WITH PROPOFOL N/A 09/26/2018   Procedure: COLONOSCOPY WITH PROPOFOL;  Surgeon: Lucilla Lame, MD;  Location: [facility] END[facility]COPY;  Service: Endoscopy;  Laterality: N/A;  . CYSTOSCOPY W/ RETROGRADES Right 08/18/2018   Procedure: CYSTOSCOPY WITH RETROGRADE PYELOGRAM;  Surgeon: Billey Co, MD;  Location: [facility];  Service: Urology;  Laterality: Right;  . CYSTOSCOPY/URETEROSCOPY/HOLMIUM LASER/STENT PLACEMENT Right 08/18/2018   Procedure: CYSTOSCOPY/URETEROSCOPY/STENT PLACEMENT;  Surgeon: Billey Co, MD;  Location: [facility] OR[facility];   Service: Urology;  Laterality: Right;  . ESOPHAGOGASTRODUODENOSCOPY (EGD) WITH PROPOFOL N/A 09/26/2018   Procedure: ESOPHAGOGASTRODUODENOSCOPY (EGD) WITH PROPOFOL;  Surgeon: Lucilla Lame, MD;  Location: [facility] END[facility];  Service: Endoscopy;  Laterality: N/A;  . GIVENS CAPSULE STUDY N/A 11/03/2018   Procedure: GIVENS CAPSULE STUDY;  Surgeon: Lucilla Lame, MD;  Location: [facility] ENDOSCOPY;  Service: Endoscopy;  Laterality: N/A;  . JOINT REPLACEMENT Left    knee  . KNEE ARTHROPLASTY Right 04/09/2019   Procedure: COMPUTER ASSISTED TOTAL KNEE ARTHROPLASTY;  Surgeon: Dereck Leep, MD;  Location: [facility] OR[facility];  Service: Orthopedics;  Laterality: Right;  . KNEE CLOSED REDUCTION Left 04/15/2015   Procedure: CLOSED MANIPULATION KNEE;  Surgeon: Thornton Park, MD;  Location: [facility];  Service: Orthopedics;  Laterality: Left;  . KNEE SURGERY    . RIGHT/LEFT HEART CATH AND CORONARY ANGIOGRAPHY Bilateral 09/06/2019   Procedure: RIGHT/LEFT HEART CATH AND CORONARY ANGIOGRAPHY;  Surgeon: Minna Merritts, MD;  Location: [place];  Service: Cardiovascular;  Laterality: Bilateral;  . TEE WITHOUT CARDIOVERSION N/A 09/13/2017   Procedure: TRANSESOPHAGEAL ECHOCARDIOGRAM (TEE);  Surgeon: Nelva Bush, MD;  Location: [facility];  Service: Cardiovascular;  Laterality: N/A;  . TOTAL KNEE ARTHROPLASTY  Left 12/25/2014   Procedure: TOTAL KNEE ARTHROPLASTY;  Surgeon: Thornton Park, MD;  Location: [facility];  Service: Orthopedics;  Laterality: Left;  . TUBAL LIGATION      Medical History: Past Medical History:  Diagnosis Date  . (HFpEF) heart failure with preserved ejection fraction ([place])    a. 08/2017 Echo: EF 55-60%.  Grade 2 diastolic dysfunction.  Moderate mitral stenosis.  . Allergy   . Anemia   . Anxiety   . BRCA negative 03/22/2013  . Bronchitis 02/19/2016   ON LEVAQUIN PO  . Chest tightness   . Cigarette smoker 09/11/2017   8-10 day  . Dyspnea   . GERD (gastroesophageal reflux disease)   . History  of kidney stones   . Hyperlipidemia   . Hypertension   . Interstitial lung disease ([place])    a. CT 2013 b. 02/2018 CXR noted recurrent intersistial changes ILD vs chronic bronchitis  . Moderate mitral stenosis    a.  08/2017 TEE: EF 60 to 65%.  Moderate mitral stenosis.  Mean gradient 14 mmHg.  Valve area 2.59 cm by planimetry, 2.72 cm by pressure half-time.  . Persistent atrial fibrillation ([place])    a. 08/2017 s/p TEE/DCCV; b. CHA2DS2VASc = 2-->warfarin.    Family History: Family History  Problem Relation Age of Onset  . Cancer Mother 61       breast  . Hypertension Mother   . Cancer Maternal Aunt        breast  . Cancer Maternal Grandmother        breast  . Osteoarthritis Father   . Hypertension Father     Social History   Socioeconomic History  . Marital status: Married    Spouse name: Not on file  . Number of children: Not on file  . Years of education: Not on file  . Highest education level: Not on file  Occupational History  . Not on file  Tobacco Use  . Smoking status: Former Smoker    Packs/day: 0.50    Years: 20.00    Pack years: 10.00    Types: Cigarettes    Quit date: 02/20/2018    Years since quitting: 1.5  . Smokeless tobacco: Never Used  Vaping Use  . Vaping Use: Never used  Substance and Sexual Activity  . Alcohol use: Yes    Comment: rarely  . Drug use: No  . Sexual activity: Not on file  Other Topics Concern  . Not on file  Social History Narrative  . Not on file   Social Determinants of Health   Financial Resource Strain:   . Difficulty of Paying Living Expenses: Not on file  Food Insecurity:   . Worried About Charity fundraiser in the Last Year: Not on file  . Ran Out of Food in the Last Year: Not on file  Transportation Needs:   . Lack of Transportation (Medical): Not on file  . Lack of Transportation (Non-Medical): Not on file  Physical Activity:   . Days of Exercise per Week: Not on file  . Minutes of Exercise per Session: Not on  file  Stress:   . Feeling of Stress : Not on file  Social Connections:   . Frequency of Communication with Friends and Family: Not on file  . Frequency of Social Gatherings with Friends and Family: Not on file  . Attends Religious Services: Not on file  . Active Member of Clubs or Organizations: Not on file  . Attends Club or  Organization Meetings: Not on file  . Marital Status: Not on file  Intimate Partner Violence:   . Fear of Current or Ex-Partner: Not on file  . Emotionally Abused: Not on file  . Physically Abused: Not on file  . Sexually Abused: Not on file      Review of Systems  Constitutional: Positive for activity change, fatigue and fever. Negative for chills and unexpected weight change.  HENT: Positive for congestion, postnasal drip, rhinorrhea, sinus pressure and sinus pain. Negative for sneezing and sore throat.   Respiratory: Negative for cough, chest tightness and shortness of breath.   Cardiovascular: Negative for chest pain and palpitations.  Gastrointestinal: Positive for nausea. Negative for abdominal pain, constipation, diarrhea and vomiting.  Genitourinary: Positive for dysuria, flank pain, frequency and urgency.  Musculoskeletal: Positive for arthralgias, back pain and myalgias. Negative for joint swelling and neck pain.  Skin: Negative for rash.  Allergic/Immunologic: Positive for environmental allergies.  Neurological: Positive for headaches. Negative for tremors and numbness.  Hematological: Positive for adenopathy. Does not bruise/bleed easily.  Psychiatric/Behavioral: Negative for behavioral problems (Depression), sleep disturbance and suicidal ideas. The patient is nervous/anxious.     Today's Vitals   08/14/19 1516  BP: 126/66  Pulse: 53  Resp: 16  Height: '5\' 4"'  (1.626 m)   Body mass index is 39.82 kg/m.  Observation/Objective:   The patient is alert and oriented. She is pleasant and answers all questions appropriately. Breathing is  non-labored. She is in no acute distress at this time. The patient sounds nasally congested. She sounds as though she is in pain and does not feel well.     Assessment/Plan: 1. Acute upper respiratory infection Start levofloxacin 540m daily for 7 days. Rest and increase fluids. Take OTC medications as needed and as indicated for acute symptoms.  - levofloxacin (LEVAQUIN) 500 MG tablet; Take 1 tablet (500 mg total) by mouth daily. (Patient not taking: Reported on 09/06/2019)  Dispense: 7 tablet; Refill: 0  2. Vaginal candidiasis Diflucan 1539mshould be taken one time. May rpeat the dose in three days as needed for persistent symptoms.   3. COVID-19 virus infection Recently diagnosed with COVID 19. Discussed self-isolation precautions. Patient voiced understanding and agreement. Due to expectation to return to office to work, will fill out paperwork necessary for her to continue with remote working. Currently with COVID 19, has long history of a fib and is at increased risk for coplications developing if she were to recontract this disease once she recovers from initial infection.    4. Paroxysmal atrial fibrillation ([place] should follow up with cardiology as scheduled.   General Counseling: Jakerra verbalizes understanding of the findings of today's phone visit and agrees with plan of treatment. I have discussed any further diagnostic evaluation that may be needed or ordered today. We also reviewed her medications today. she has been encouraged to call the office with any questions or concerns that should arise related to todays visit.   This patient was seen by HeHeneferith Dr FoLavera Guises a part of collaborative care agreement  Meds ordered this encounter  Medications  . levofloxacin (LEVAQUIN) 500 MG tablet    Sig: Take 1 tablet (500 mg total) by mouth daily.    Dispense:  7 tablet    Refill:  0    I understand that levofloxacin and warfarin interact.  Patient has INR checked weekly and will adjust warfarin dosing accordingly.    Order Specific Question:  Supervising Provider    Answer:   Lavera Guise [[id number]]  . DISCONTD: fluconazole (DIFLUCAN) 150 MG tablet    Sig: Take 1 tablet po once. May repeat dose in 3 days as needed for persistent symptoms.    Dispense:  3 tablet    Refill:  1    Order Specific Question:   Supervising Provider    Answer:   Lavera Guise [[id number]]    Time spent: 67 Minutes    Dr Lavera Guise Internal medicine

## 2019-08-15 ENCOUNTER — Other Ambulatory Visit: Payer: Self-pay

## 2019-08-15 ENCOUNTER — Ambulatory Visit (INDEPENDENT_AMBULATORY_CARE_PROVIDER_SITE_OTHER): Payer: Managed Care, Other (non HMO)

## 2019-08-15 ENCOUNTER — Encounter: Payer: Self-pay | Admitting: *Deleted

## 2019-08-15 DIAGNOSIS — I4891 Unspecified atrial fibrillation: Secondary | ICD-10-CM

## 2019-08-15 DIAGNOSIS — Z5181 Encounter for therapeutic drug level monitoring: Secondary | ICD-10-CM | POA: Diagnosis not present

## 2019-08-15 DIAGNOSIS — I050 Rheumatic mitral stenosis: Secondary | ICD-10-CM | POA: Diagnosis not present

## 2019-08-15 LAB — POCT INR: INR: 2.6 (ref 2.0–3.0)

## 2019-08-15 NOTE — Telephone Encounter (Addendum)
Spoke with patient and scheduled her for procedure. Letters done and instructions printed to mail out.

## 2019-08-15 NOTE — Telephone Encounter (Signed)
Left voicemail message to call back  

## 2019-08-15 NOTE — Telephone Encounter (Signed)
Spoke with patient and advised that Dr. Rockey Situ said that she could send [place] a my Chart message with all of her questions and I do have a virtual visit available for the second week in August. She is coming in today for her coumadin check and provider aware as well in case he can talk with her briefly about her questions about her upcoming procedure. She was appreciative for the call with no other concerns at this time.

## 2019-08-15 NOTE — Telephone Encounter (Signed)
Spoke with patient and she is now ready to schedule her procedure. Reviewed policy on scheduling procedure for post COVID patients. She states that she would like to speak with provider prior to procedure to ask some questions. Reviewed that I am not sure how far out this would be but I would be happy to look for her. She was agreeable to a virtual visit if possible as well. Advised that I would review his schedule and see if we can find appointment and also scheduling for procedure and call her back.

## 2019-08-15 NOTE — Patient Instructions (Signed)
-   continue warfarin dosage of 1.25 mg (half the 2.5 mg tablet) every day.   - recheck INR in 6 weeks 

## 2019-08-20 ENCOUNTER — Encounter: Payer: Self-pay | Admitting: *Deleted

## 2019-08-20 ENCOUNTER — Telehealth: Payer: Self-pay | Admitting: Cardiovascular Disease

## 2019-08-20 NOTE — Telephone Encounter (Signed)
Yes would recommend she work from home

## 2019-08-20 NOTE — Telephone Encounter (Signed)
Patient calling in stating she needs a note for work stating she is required to quarantine due to upcoming heart cath and covid test. Patient would like her husband to pick note up. Please advise patient when note is ready. Patients cath is on 8/12

## 2019-08-20 NOTE — Telephone Encounter (Signed)
Patient notified and letter sent to patient's MyChart.

## 2019-08-20 NOTE — Telephone Encounter (Signed)
Patient concerned about returning to in-person workplace. She said people at work have had [place] and she is concerned about getting the virus again prior to her heart cath on 08/30/19. She wants a letter to her employer to say that she should not return to work until the procedure can be done and that she needs to work from home until then. Patient tested COVID positive on 08/01/19 and does not want to risk being in contact with someone with COVID. Advised that we would not be able to write a note for this issue as we have not been doing these types of notes. Encouraged her to wear her mask and socially distance as she can at work. Advised I will ask my manager just in case this is something we could do and let her know if anything is different concerning the issue; however, most likely we would not. Patient was understanding and appreciative.

## 2019-08-28 ENCOUNTER — Other Ambulatory Visit: Payer: Managed Care, Other (non HMO)

## 2019-08-29 ENCOUNTER — Telehealth: Payer: Self-pay | Admitting: Cardiovascular Disease

## 2019-08-29 ENCOUNTER — Telehealth: Payer: Self-pay | Admitting: *Deleted

## 2019-08-29 ENCOUNTER — Encounter: Payer: Self-pay | Admitting: Cardiovascular Disease

## 2019-08-29 ENCOUNTER — Telehealth (INDEPENDENT_AMBULATORY_CARE_PROVIDER_SITE_OTHER): Payer: Managed Care, Other (non HMO) | Admitting: Cardiovascular Disease

## 2019-08-29 ENCOUNTER — Other Ambulatory Visit: Payer: Self-pay

## 2019-08-29 VITALS — BP 127/70 | HR 65 | Ht 64.0 in | Wt 232.0 lb

## 2019-08-29 DIAGNOSIS — F17200 Nicotine dependence, unspecified, uncomplicated: Secondary | ICD-10-CM | POA: Diagnosis not present

## 2019-08-29 DIAGNOSIS — I480 Paroxysmal atrial fibrillation: Secondary | ICD-10-CM

## 2019-08-29 DIAGNOSIS — I050 Rheumatic mitral stenosis: Secondary | ICD-10-CM | POA: Diagnosis not present

## 2019-08-29 DIAGNOSIS — Z01810 Encounter for preprocedural cardiovascular examination: Secondary | ICD-10-CM

## 2019-08-29 DIAGNOSIS — I5032 Chronic diastolic (congestive) heart failure: Secondary | ICD-10-CM | POA: Diagnosis not present

## 2019-08-29 NOTE — Telephone Encounter (Signed)
Left voicemail message that procedure has been rescheduled for next week and instructions have been updated as well and to call back if possible to confirm her procedure date and time.

## 2019-08-29 NOTE — H&P (View-Only) (Signed)
Virtual Visit via Video Note   This visit type was conducted due to national recommendations for restrictions regarding the COVID-19 Pandemic (e.g. social distancing) in an effort to limit this patient's exposure and mitigate transmission in our community.  Due to her co-morbid illnesses, this patient is at least at moderate risk for complications without adequate follow up.  This format is felt to be most appropriate for this patient at this time.  All issues noted in this document were discussed and addressed.  A limited physical exam was performed with this format.  Please refer to the patient's chart for her consent to telehealth for [facility].    Date:  08/29/2019   ID:  Leslie [name], DOB 09-Sep-1965, MRN [id number] The patient was identified using 2 identifiers.  Patient Location: Home Provider Location: Office/Clinic  PCP:  Ronnell Freshwater, NP  Cardiologist:  Ida Rogue, MD  Electrophysiologist:  None   Evaluation Performed:  Follow-Up Visit   Chief Complaint  Patient presents with  . OTHER    Per patient she would like to discuss Cath procedure. Sometimes she will take an extra Metoprolol because it feels like her heart is going into Afib. Medications verbally reviewed with patient.     History of Present Illness:    Leslie [name] is a 54 y.o. female with  mitral valve stenosis morbid obesity,  Snores, possible OSA anxiety,  hyperlipidemia,  Hospital admission 09/11/2017 for  shortness of breath chest tightness in atrial fibrillation with RVR Had TEE  , Cardioversion Mild COPD, long hx of smoking Cardiac CTA Coronary calcium score of 10. Who presents for follow up of her paroxysmal atrial fibrillation, mitral valve stenosis  Reports that she has had the Covid vaccine before Got recent Covid infection while at work, more than 20 people infected she reports at work They are not taking any precautions where she is That been working remotely for 1 year,  recently went back She did receive antibiotic infusion at [place] long Slow improvement, more tachycardia, some flares of her shortness of breath  Several episode she felt like she was going to go into atrial fibrillation, took extra half dose metoprolol   Prior telemetry visit July 04, 2019, Was having more shortness of breath, fluid retention Currently taking torsemide 40 twice daily  Prior echocardiogram with severe mitral valve stenosis, moderately elevated right heart pressures  Prior discussions, she preferred a more aggressive approach, wanted to head towards valve replacement surgery  Has been scheduled to meet with Dr. Roxy Manns on September 10, 2019 for further discussion  Other past medical history reviewed Echocardiogram August 2020  mitral valve Moderate thickening of the mitral valve leaflet. Moderate-severe mitral valve stenosis, with mean gradient of 16 mmHg and MVA by continuity equation of 1.0 cm^2.   Transesophageal echo August 2019 with moderate mitral valve stenosis mean gradient of 14 Mobility of the posterior leaflet was restricted.   summer 2020 developed right flank pain,   underwent right ureteroscopy, laser lithotripsy, and stent placement on 08/18/2018. Had pyelonephritis post procedure Creatinine trended up  to 4.1  acute kidney injury and sepsis in the setting of recent right ureteral stone status post lithotripsy and likely pyelonephritis.  atrial fibrillation December 2019 01/05/2018 awoke suddenly with tachypalpitations  rates in the 130s  EMS was called, noted to be in sinus rhythm with heart rates in the 80s to 90s bpm.   Cardiac CTA 1. Coronary calcium score of 10. This was 70 percentile for age and  sex matched control.. Normal coronary origin with right dominance.Mild non-obstructive CAD.   The patient does not have symptoms concerning for COVID-19 infection (fever, chills, cough, or new shortness of breath).    Past Medical History:    Diagnosis Date  . (HFpEF) heart failure with preserved ejection fraction (HCC)    a. 08/2017 Echo: EF 55-60%.  Grade 2 diastolic dysfunction.  Moderate mitral stenosis.  . Allergy   . Anemia   . Anxiety   . BRCA negative 03/22/2013  . Bronchitis 02/19/2016   ON LEVAQUIN PO  . Chest tightness   . Cigarette smoker 09/11/2017   8-10 day  . Dyspnea   . GERD (gastroesophageal reflux disease)   . History of kidney stones   . Hyperlipidemia   . Hypertension   . Interstitial lung disease (HCC)    a. CT 2013 b. 02/2018 CXR noted recurrent intersistial changes ILD vs chronic bronchitis  . Moderate mitral stenosis    a.  08/2017 TEE: EF 60 to 65%.  Moderate mitral stenosis.  Mean gradient 14 mmHg.  Valve area 2.59 cm by planimetry, 2.72 cm by pressure half-time.  . Persistent atrial fibrillation (HCC)    a. 08/2017 s/p TEE/DCCV; b. CHA2DS2VASc = 2-->warfarin.   Past Surgical History:  Procedure Laterality Date  . ABDOMINAL HYSTERECTOMY     total  . ABDOMINAL HYSTERECTOMY    . BREAST BIOPSY Bilateral 2012  . BREAST BIOPSY Right 08-07-12   fibroadenomatous changes and columnar cells  . BREAST BIOPSY  02/03/2015   stereo byrnett  . CHOLECYSTECTOMY N/A 02/27/2016   Procedure: LAPAROSCOPIC CHOLECYSTECTOMY;  Surgeon: Seeplaputhur G Sankar, MD;  Location: [facility];  Service: General;  Laterality: N/A;  . COLONOSCOPY WITH PROPOFOL N/A 09/26/2018   Procedure: COLONOSCOPY WITH PROPOFOL;  Surgeon: Wohl, Darren, MD;  Location: [facility];  Service: Endoscopy;  Laterality: N/A;  . CYSTOSCOPY W/ RETROGRADES Right 08/18/2018   Procedure: CYSTOSCOPY WITH RETROGRADE PYELOGRAM;  Surgeon: Sninsky, Brian C, MD;  Location: [facility];  Service: Urology;  Laterality: Right;  . CYSTOSCOPY/URETEROSCOPY/HOLMIUM LASER/STENT PLACEMENT Right 08/18/2018   Procedure: CYSTOSCOPY/URETEROSCOPY/STENT PLACEMENT;  Surgeon: Sninsky, Brian C, MD;  Location: [facility];  Service: Urology;  Laterality: Right;  .  ESOPHAGOGASTRODUODENOSCOPY (EGD) WITH PROPOFOL N/A 09/26/2018   Procedure: ESOPHAGOGASTRODUODENOSCOPY (EGD) WITH PROPOFOL;  Surgeon: Wohl, Darren, MD;  Location: [facility];  Service: Endoscopy;  Laterality: N/A;  . GIVENS CAPSULE STUDY N/A 11/03/2018   Procedure: GIVENS CAPSULE STUDY;  Surgeon: Wohl, Darren, MD;  Location: [facility];  Service: Endoscopy;  Laterality: N/A;  . JOINT REPLACEMENT Left    knee  . KNEE ARTHROPLASTY Right 04/09/2019   Procedure: COMPUTER ASSISTED TOTAL KNEE ARTHROPLASTY;  Surgeon: Hooten, James P, MD;  Location: [facility];  Service: Orthopedics;  Laterality: Right;  . KNEE CLOSED REDUCTION Left 04/15/2015   Procedure: CLOSED MANIPULATION KNEE;  Surgeon: Kevin Krasinski, MD;  Location: [facility];  Service: Orthopedics;  Laterality: Left;  . KNEE SURGERY    . TEE WITHOUT CARDIOVERSION N/A 09/13/2017   Procedure: TRANSESOPHAGEAL ECHOCARDIOGRAM (TEE);  Surgeon: End, Christopher, MD;  Location: [facility];  Service: Cardiovascular;  Laterality: N/A;  . TOTAL KNEE ARTHROPLASTY Left 12/25/2014   Procedure: TOTAL KNEE ARTHROPLASTY;  Surgeon: Kevin Krasinski, MD;  Location: [facility];  Service: Orthopedics;  Laterality: Left;  . TUBAL LIGATION       Current Meds  Medication Sig  . acetaminophen (TYLENOL) 500 MG tablet Take 1,000 mg by mouth every 6 (six) hours as needed for moderate   pain or fever.  . ALPRAZolam (XANAX) 0.5 MG tablet Take 1 tablet (0.5 mg total) by mouth at bedtime as needed for anxiety.  Marland Kitchen atorvastatin (LIPITOR) 80 MG tablet Take 80 mg by mouth daily.  . calcium carbonate (TUMS - DOSED IN MG ELEMENTAL CALCIUM) 500 MG chewable tablet Chew 1 tablet by mouth daily as needed for indigestion or heartburn.  . diltiazem (CARDIZEM CD) 120 MG 24 hr capsule Take 1 capsule (120 mg total) by mouth daily.  . fluticasone (FLONASE) 50 MCG/ACT nasal spray Place 1 spray into both nostrils daily as needed for allergies or rhinitis.  Marland Kitchen levofloxacin (LEVAQUIN) 500 MG tablet  Take 1 tablet (500 mg total) by mouth daily.  . metoprolol succinate (TOPROL-XL) 50 MG 24 hr tablet Take 50-100 mg by mouth See admin instructions. Take 50 mg in the morning and 100 mg at night  . omeprazole (PRILOSEC) 40 MG capsule Take 1 capsule (40 mg total) by mouth daily.  . potassium chloride (KLOR-CON) 10 MEQ tablet Take 20-30 mEq by mouth See admin instructions. Take 30 meq in the morning and 20 meq at night  . torsemide (DEMADEX) 20 MG tablet Take 2 tablets (40 mg total) by mouth 2 (two) times daily.  Marland Kitchen warfarin (COUMADIN) 2 MG tablet TAKE AS DIRECTED BY COUMADIN CLINIC  . warfarin (COUMADIN) 2.5 MG tablet Take 0.5 tablets (1.25 mg total) by mouth See admin instructions. Take 0.5 tablet (1.25 mg) by mouth on Tuesdays & Thursdays. (Patient taking differently: Take 1.25 mg by mouth daily. )     Allergies:   Morphine, Oxycodone hcl, and Dilaudid [hydromorphone hcl]   Social History   Tobacco Use  . Smoking status: Former Smoker    Packs/day: 0.50    Years: 20.00    Pack years: 10.00    Types: Cigarettes    Quit date: 02/20/2018    Years since quitting: 1.5  . Smokeless tobacco: Never Used  Vaping Use  . Vaping Use: Never used  Substance Use Topics  . Alcohol use: Yes    Comment: rarely  . Drug use: No     Family Hx: The patient's family history includes Cancer in her maternal aunt and maternal grandmother; Cancer (age of onset: 58) in her mother; Hypertension in her father and mother; Osteoarthritis in her father.  ROS:   Please see the history of present illness.    Review of Systems  Constitutional: Negative.   Respiratory: Negative.   Cardiovascular: Negative.   Gastrointestinal: Negative.   Musculoskeletal: Negative.   Neurological: Negative.   Psychiatric/Behavioral: Negative.   All other systems reviewed and are negative.   Prior CV studies:   The following studies were reviewed today:    Labs/Other Tests and Data Reviewed:    EKG:  No ECG  reviewed.  Recent Labs: 11/28/2018: Magnesium 1.9 03/30/2019: ALT 22 08/01/2019: BUN 18; Creatinine, Ser 1.49; Hemoglobin 11.0; Platelets 339; Potassium 3.4; Sodium 136   Recent Lipid Panel Lab Results  Component Value Date/Time   CHOL 171 09/19/2018 09:32 AM   CHOL 170 04/07/2018 09:10 AM   TRIG 186 (H) 09/19/2018 09:32 AM   HDL 33 (L) 09/19/2018 09:32 AM   HDL 34 (L) 04/07/2018 09:10 AM   CHOLHDL 5.2 09/19/2018 09:32 AM   LDLCALC 101 (H) 09/19/2018 09:32 AM   LDLCALC 109 (H) 04/07/2018 09:10 AM    Wt Readings from Last 3 Encounters:  08/29/19 232 lb (105.2 kg)  07/27/19 232 lb (105.2 kg)  07/04/19 228 lb (  103.4 kg)     Objective:    Vital Signs:  BP 127/70   Pulse 65   Ht 5' 4" (1.626 m)   Wt 232 lb (105.2 kg)   BMI 39.82 kg/m    VITAL SIGNS:  reviewed  Constitutional:  oriented to person, place, and time. No distress.  Psychiatric:  normal mood and affect. behavior is normal. Thought content normal.    ASSESSMENT & PLAN:    1. Mitral valve stenosis Unfortunately did not stop her warfarin in preparation for right and left heart catheterization scheduled tomorrow.  Was told by someone at the hospital that she did not need to stop it. We have provided different directions, she will hold warfarin 5 days prior to catheterization, will reschedule her for August 19, in preparation for her meeting with Dr. Roxy Manns September 10, 2019  2.  Paroxysmal atrial fibrillation Recommend she continue metoprolol, may need to increase up to 100 twice daily given Currently on warfarin, we will hold this 5 days prior to upcoming catheterization next week -Dr. Roxy Manns to discuss options with her, potential maze procedure   COVID-19 Education: The signs and symptoms of COVID-19 were discussed with the patient and how to seek care for testing (follow up with PCP or arrange E-visit).  The importance of social distancing was discussed today.  Time:   Today, I have spent 20 minutes with the  patient with telehealth technology discussing the above problems.     Medication Adjustments/Labs and Tests Ordered: Current medicines are reviewed at length with the patient today.  Concerns regarding medicines are outlined above.   Tests Ordered: No orders of the defined types were placed in this encounter.   Medication Changes: No orders of the defined types were placed in this encounter.   Follow Up:  In Person 1 month  Signed, Ida Rogue, MD  08/29/2019 11:13 AM    [place]

## 2019-08-29 NOTE — Progress Notes (Addendum)
 Virtual Visit via Video Note   This visit type was conducted due to national recommendations for restrictions regarding the COVID-19 Pandemic (e.g. social distancing) in an effort to limit this patient's exposure and mitigate transmission in our community.  Due to her co-morbid illnesses, this patient is at least at moderate risk for complications without adequate follow up.  This format is felt to be most appropriate for this patient at this time.  All issues noted in this document were discussed and addressed.  A limited physical exam was performed with this format.  Please refer to the patient's chart for her consent to telehealth for [facility].    Date:  08/29/2019   ID:  Leslie [name], DOB 11/24/1965, MRN [id number] The patient was identified using 2 identifiers.  Patient Location: Home Provider Location: Office/Clinic  PCP:  Boscia, Heather E, NP  Cardiologist:  Heleena Miceli, MD  Electrophysiologist:  None   Evaluation Performed:  Follow-Up Visit   Chief Complaint  Patient presents with  . OTHER    Per patient she would like to discuss Cath procedure. Sometimes she will take an extra Metoprolol because it feels like her heart is going into Afib. Medications verbally reviewed with patient.     History of Present Illness:    Leslie [name] is a 54 y.o. female with  mitral valve stenosis morbid obesity,  Snores, possible OSA anxiety,  hyperlipidemia,  Hospital admission 09/11/2017 for  shortness of breath chest tightness in atrial fibrillation with RVR Had TEE  , Cardioversion Mild COPD, long hx of smoking Cardiac CTA Coronary calcium score of 10. Who presents for follow up of her paroxysmal atrial fibrillation, mitral valve stenosis  Reports that she has had the Covid vaccine before Got recent Covid infection while at work, more than 20 people infected she reports at work They are not taking any precautions where she is That been working remotely for 1 year,  recently went back She did receive antibiotic infusion at [place] Slow improvement, more tachycardia, some flares of her shortness of breath  Several episode she felt like she was going to go into atrial fibrillation, took extra half dose metoprolol   Prior telemetry visit July 04, 2019, Was having more shortness of breath, fluid retention Currently taking torsemide 40 twice daily  Prior echocardiogram with severe mitral valve stenosis, moderately elevated right heart pressures  Prior discussions, she preferred a more aggressive approach, wanted to head towards valve replacement surgery  Has been scheduled to meet with Dr. Owen on September 10, 2019 for further discussion  Other past medical history reviewed Echocardiogram August 2020  mitral valve Moderate thickening of the mitral valve leaflet. Moderate-severe mitral valve stenosis, with mean gradient of 16 mmHg and MVA by continuity equation of 1.0 cm^2.   Transesophageal echo August 2019 with moderate mitral valve stenosis mean gradient of 14 Mobility of the posterior leaflet was restricted.   summer 2020 developed right flank pain,   underwent right ureteroscopy, laser lithotripsy, and stent placement on 08/18/2018. Had pyelonephritis post procedure Creatinine trended up  to 4.1  acute kidney injury and sepsis in the setting of recent right ureteral stone status post lithotripsy and likely pyelonephritis.  atrial fibrillation December 2019 01/05/2018 awoke suddenly with tachypalpitations  rates in the 130s  EMS was called, noted to be in sinus rhythm with heart rates in the 80s to 90s bpm.   Cardiac CTA 1. Coronary calcium score of 10. This was 89 percentile for age and   sex matched control.. Normal coronary origin with right dominance.Mild non-obstructive CAD.   The patient does not have symptoms concerning for COVID-19 infection (fever, chills, cough, or new shortness of breath).    Past Medical History:    Diagnosis Date  . (HFpEF) heart failure with preserved ejection fraction (HCC)    a. 08/2017 Echo: EF 55-60%.  Grade 2 diastolic dysfunction.  Moderate mitral stenosis.  . Allergy   . Anemia   . Anxiety   . BRCA negative 03/22/2013  . Bronchitis 02/19/2016   ON LEVAQUIN PO  . Chest tightness   . Cigarette smoker 09/11/2017   8-10 day  . Dyspnea   . GERD (gastroesophageal reflux disease)   . History of kidney stones   . Hyperlipidemia   . Hypertension   . Interstitial lung disease (HCC)    a. CT 2013 b. 02/2018 CXR noted recurrent intersistial changes ILD vs chronic bronchitis  . Moderate mitral stenosis    a.  08/2017 TEE: EF 60 to 65%.  Moderate mitral stenosis.  Mean gradient 14 mmHg.  Valve area 2.59 cm by planimetry, 2.72 cm by pressure half-time.  . Persistent atrial fibrillation (HCC)    a. 08/2017 s/p TEE/DCCV; b. CHA2DS2VASc = 2-->warfarin.   Past Surgical History:  Procedure Laterality Date  . ABDOMINAL HYSTERECTOMY     total  . ABDOMINAL HYSTERECTOMY    . BREAST BIOPSY Bilateral 2012  . BREAST BIOPSY Right 08-07-12   fibroadenomatous changes and columnar cells  . BREAST BIOPSY  02/03/2015   stereo byrnett  . CHOLECYSTECTOMY N/A 02/27/2016   Procedure: LAPAROSCOPIC CHOLECYSTECTOMY;  Surgeon: Seeplaputhur G Sankar, MD;  Location: [facility];  Service: General;  Laterality: N/A;  . COLONOSCOPY WITH PROPOFOL N/A 09/26/2018   Procedure: COLONOSCOPY WITH PROPOFOL;  Surgeon: Wohl, Darren, MD;  Location: [facility];  Service: Endoscopy;  Laterality: N/A;  . CYSTOSCOPY W/ RETROGRADES Right 08/18/2018   Procedure: CYSTOSCOPY WITH RETROGRADE PYELOGRAM;  Surgeon: Sninsky, Brian C, MD;  Location: [facility];  Service: Urology;  Laterality: Right;  . CYSTOSCOPY/URETEROSCOPY/HOLMIUM LASER/STENT PLACEMENT Right 08/18/2018   Procedure: CYSTOSCOPY/URETEROSCOPY/STENT PLACEMENT;  Surgeon: Sninsky, Brian C, MD;  Location: [facility];  Service: Urology;  Laterality: Right;  .  ESOPHAGOGASTRODUODENOSCOPY (EGD) WITH PROPOFOL N/A 09/26/2018   Procedure: ESOPHAGOGASTRODUODENOSCOPY (EGD) WITH PROPOFOL;  Surgeon: Wohl, Darren, MD;  Location: [facility];  Service: Endoscopy;  Laterality: N/A;  . GIVENS CAPSULE STUDY N/A 11/03/2018   Procedure: GIVENS CAPSULE STUDY;  Surgeon: Wohl, Darren, MD;  Location: [facility];  Service: Endoscopy;  Laterality: N/A;  . JOINT REPLACEMENT Left    knee  . KNEE ARTHROPLASTY Right 04/09/2019   Procedure: COMPUTER ASSISTED TOTAL KNEE ARTHROPLASTY;  Surgeon: Hooten, James P, MD;  Location: [facility];  Service: Orthopedics;  Laterality: Right;  . KNEE CLOSED REDUCTION Left 04/15/2015   Procedure: CLOSED MANIPULATION KNEE;  Surgeon: Kevin Krasinski, MD;  Location: [facility];  Service: Orthopedics;  Laterality: Left;  . KNEE SURGERY    . TEE WITHOUT CARDIOVERSION N/A 09/13/2017   Procedure: TRANSESOPHAGEAL ECHOCARDIOGRAM (TEE);  Surgeon: End, Christopher, MD;  Location: [facility];  Service: Cardiovascular;  Laterality: N/A;  . TOTAL KNEE ARTHROPLASTY Left 12/25/2014   Procedure: TOTAL KNEE ARTHROPLASTY;  Surgeon: Kevin Krasinski, MD;  Location: [facility];  Service: Orthopedics;  Laterality: Left;  . TUBAL LIGATION       Current Meds  Medication Sig  . acetaminophen (TYLENOL) 500 MG tablet Take 1,000 mg by mouth every 6 (six) hours as needed for moderate   pain or fever.  . ALPRAZolam (XANAX) 0.5 MG tablet Take 1 tablet (0.5 mg total) by mouth at bedtime as needed for anxiety.  . atorvastatin (LIPITOR) 80 MG tablet Take 80 mg by mouth daily.  . calcium carbonate (TUMS - DOSED IN MG ELEMENTAL CALCIUM) 500 MG chewable tablet Chew 1 tablet by mouth daily as needed for indigestion or heartburn.  . diltiazem (CARDIZEM CD) 120 MG 24 hr capsule Take 1 capsule (120 mg total) by mouth daily.  . fluticasone (FLONASE) 50 MCG/ACT nasal spray Place 1 spray into both nostrils daily as needed for allergies or rhinitis.  . levofloxacin (LEVAQUIN) 500 MG tablet  Take 1 tablet (500 mg total) by mouth daily.  . metoprolol succinate (TOPROL-XL) 50 MG 24 hr tablet Take 50-100 mg by mouth See admin instructions. Take 50 mg in the morning and 100 mg at night  . omeprazole (PRILOSEC) 40 MG capsule Take 1 capsule (40 mg total) by mouth daily.  . potassium chloride (KLOR-CON) 10 MEQ tablet Take 20-30 mEq by mouth See admin instructions. Take 30 meq in the morning and 20 meq at night  . torsemide (DEMADEX) 20 MG tablet Take 2 tablets (40 mg total) by mouth 2 (two) times daily.  . warfarin (COUMADIN) 2 MG tablet TAKE AS DIRECTED BY COUMADIN CLINIC  . warfarin (COUMADIN) 2.5 MG tablet Take 0.5 tablets (1.25 mg total) by mouth See admin instructions. Take 0.5 tablet (1.25 mg) by mouth on Tuesdays & Thursdays. (Patient taking differently: Take 1.25 mg by mouth daily. )     Allergies:   Morphine, Oxycodone hcl, and Dilaudid [hydromorphone hcl]   Social History   Tobacco Use  . Smoking status: Former Smoker    Packs/day: 0.50    Years: 20.00    Pack years: 10.00    Types: Cigarettes    Quit date: 02/20/2018    Years since quitting: 1.5  . Smokeless tobacco: Never Used  Vaping Use  . Vaping Use: Never used  Substance Use Topics  . Alcohol use: Yes    Comment: rarely  . Drug use: No     Family Hx: The patient's family history includes Cancer in her maternal aunt and maternal grandmother; Cancer (age of onset: 61) in her mother; Hypertension in her father and mother; Osteoarthritis in her father.  ROS:   Please see the history of present illness.    Review of Systems  Constitutional: Negative.   Respiratory: Negative.   Cardiovascular: Negative.   Gastrointestinal: Negative.   Musculoskeletal: Negative.   Neurological: Negative.   Psychiatric/Behavioral: Negative.   All other systems reviewed and are negative.   Prior CV studies:   The following studies were reviewed today:    Labs/Other Tests and Data Reviewed:    EKG:  No ECG  reviewed.  Recent Labs: 11/28/2018: Magnesium 1.9 03/30/2019: ALT 22 08/01/2019: BUN 18; Creatinine, Ser 1.49; Hemoglobin 11.0; Platelets 339; Potassium 3.4; Sodium 136   Recent Lipid Panel Lab Results  Component Value Date/Time   CHOL 171 09/19/2018 09:32 AM   CHOL 170 04/07/2018 09:10 AM   TRIG 186 (H) 09/19/2018 09:32 AM   HDL 33 (L) 09/19/2018 09:32 AM   HDL 34 (L) 04/07/2018 09:10 AM   CHOLHDL 5.2 09/19/2018 09:32 AM   LDLCALC 101 (H) 09/19/2018 09:32 AM   LDLCALC 109 (H) 04/07/2018 09:10 AM    Wt Readings from Last 3 Encounters:  08/29/19 232 lb (105.2 kg)  07/27/19 232 lb (105.2 kg)  07/04/19 228 lb (  103.4 kg)     Objective:    Vital Signs:  BP 127/70   Pulse 65   Ht 5' 4" (1.626 m)   Wt 232 lb (105.2 kg)   BMI 39.82 kg/m    VITAL SIGNS:  reviewed  Constitutional:  oriented to person, place, and time. No distress.  Psychiatric:  normal mood and affect. behavior is normal. Thought content normal.    ASSESSMENT & PLAN:    1. Mitral valve stenosis Unfortunately did not stop her warfarin in preparation for right and left heart catheterization scheduled tomorrow.  Was told by someone at the hospital that she did not need to stop it. We have provided different directions, she will hold warfarin 5 days prior to catheterization, will reschedule her for August 19, in preparation for her meeting with Dr. Owen September 10, 2019  2.  Paroxysmal atrial fibrillation Recommend she continue metoprolol, may need to increase up to 100 twice daily given Currently on warfarin, we will hold this 5 days prior to upcoming catheterization next week -Dr. Owen to discuss options with her, potential maze procedure   COVID-19 Education: The signs and symptoms of COVID-19 were discussed with the patient and how to seek care for testing (follow up with PCP or arrange E-visit).  The importance of social distancing was discussed today.  Time:   Today, I have spent 20 minutes with the  patient with telehealth technology discussing the above problems.     Medication Adjustments/Labs and Tests Ordered: Current medicines are reviewed at length with the patient today.  Concerns regarding medicines are outlined above.   Tests Ordered: No orders of the defined types were placed in this encounter.   Medication Changes: No orders of the defined types were placed in this encounter.   Follow Up:  In Person 1 month  Signed, Timothy Gollan, MD  08/29/2019 11:13 AM    [facility] 

## 2019-08-29 NOTE — Telephone Encounter (Signed)
  Patient Consent for Virtual Visit         Leslie [name] has provided verbal consent on 08/29/2019 for a virtual visit (video or telephone).   CONSENT FOR VIRTUAL VISIT FOR:  Leslie [name]  By participating in this virtual visit I agree to the following:  I hereby voluntarily request, consent and authorize [facility] and its employed or contracted physicians, Engineer, materials, nurse practitioners or other licensed health care professionals (the Practitioner), to provide me with telemedicine health care services (the "Services") as deemed necessary by the treating Practitioner. I acknowledge and consent to receive the Services by the Practitioner via telemedicine. I understand that the telemedicine visit will involve communicating with the Practitioner through live audiovisual communication technology and the disclosure of certain medical information by electronic transmission. I acknowledge that I have been given the opportunity to request an in-person assessment or other available alternative prior to the telemedicine visit and am voluntarily participating in the telemedicine visit.  I understand that I have the right to withhold or withdraw my consent to the use of telemedicine in the course of my care at any time, without affecting my right to future care or treatment, and that the Practitioner or I may terminate the telemedicine visit at any time. I understand that I have the right to inspect all information obtained and/or recorded in the course of the telemedicine visit and may receive copies of available information for a reasonable fee.  I understand that some of the potential risks of receiving the Services via telemedicine include:  Marland Kitchen Delay or interruption in medical evaluation due to technological equipment failure or disruption; . Information transmitted may not be sufficient (e.g. poor resolution of images) to allow for appropriate medical decision making by the Practitioner;  and/or  . In rare instances, security protocols could fail, causing a breach of personal health information.  Furthermore, I acknowledge that it is my responsibility to provide information about my medical history, conditions and care that is complete and accurate to the best of my ability. I acknowledge that Practitioner's advice, recommendations, and/or decision may be based on factors not within their control, such as incomplete or inaccurate data provided by me or distortions of diagnostic images or specimens that may result from electronic transmissions. I understand that the practice of medicine is not an exact science and that Practitioner makes no warranties or guarantees regarding treatment outcomes. I acknowledge that a copy of this consent can be made available to me via my patient portal ([facility]), or I can request a printed copy by calling the office of [facility].    I understand that my insurance will be billed for this visit.   I have read or had this consent read to me. . I understand the contents of this consent, which adequately explains the benefits and risks of the Services being provided via telemedicine.  . I have been provided ample opportunity to ask questions regarding this consent and the Services and have had my questions answered to my satisfaction. . I give my informed consent for the services to be provided through the use of telemedicine in my medical care    I connected with  Leslie [name] on 08/29/19 by a video enabled telemedicine application and verified that I am speaking with the correct person using two identifiers.   I discussed the limitations of evaluation and management by telemedicine. The patient expressed understanding and agreed to proceed.

## 2019-08-29 NOTE — Patient Instructions (Addendum)
Medication Instructions:  No changes  If you need a refill on your cardiac medications before your next appointment, please call your pharmacy.    Lab work: No new labs needed   If you have labs (blood work) drawn today and your tests are completely normal, you will receive your results only by: Marland Kitchen MyChart Message (if you have MyChart) OR . A paper copy in the mail If you have any lab test that is abnormal or we need to change your treatment, we will call you to review the results.   Testing/Procedures:   [facility] Cardiac Cath Instructions   You are scheduled for a Cardiac Cath on: Thursday August 19th  Please arrive at 07:30 am on the day of your procedure  Please expect a call from our [facility] to pre-register you  Do not eat/drink anything after midnight  Someone will need to drive you home  It is recommended someone be with you for the first 24 hours after your procedure  Wear clothes that are easy to get on/off and wear slip on shoes if possible   Medications bring a current list of all medications with you  _XX__ HOLD warfarin 5 days prior to procedure, starting on August 14th  _XX__ Do not take these medications before your procedure: Torsemide or Potassium.   _XX__ You may take all of your medications the morning of your procedure with enough water to swallow safely   Day of your procedure: Arrive at the [facility] entrance.  Free valet service is available.  After entering the [facility] please check-in at the registration desk (1st desk on your right) to receive your armband. After receiving your armband someone will escort you to the cardiac cath/special procedures waiting area.  The usual length of stay after your procedure is about 2 to 3 hours.  This can vary.  If you have any questions, please call our office at [phone], or you may call the cardiac cath lab at [facility] directly at [phone]    Follow-Up: At [facility], you and your health needs are our priority.  As part of our continuing mission to provide you with exceptional heart care, we have created designated Provider Care Teams.  These Care Teams include your primary Cardiologist (physician) and Advanced Practice Providers (APPs -  Physician Assistants and Nurse Practitioners) who all work together to provide you with the care you need, when you need it.  . You will need a follow up appointment in 1 month   . Providers on your designated Care Team:   . Murray Hodgkins, NP . Christell Faith, PA-C . Marrianne Mood, PA-C  Any Other Special Instructions Will Be Listed Below (If Applicable).

## 2019-08-30 NOTE — Telephone Encounter (Signed)
Patient returning call.

## 2019-08-31 ENCOUNTER — Other Ambulatory Visit: Payer: Self-pay

## 2019-08-31 DIAGNOSIS — F419 Anxiety disorder, unspecified: Secondary | ICD-10-CM

## 2019-08-31 MED ORDER — ALPRAZOLAM 0.5 MG PO TABS: 0.5000 mg | ORAL_TABLET | Freq: Every evening | ORAL | 2 refills | Status: DC | PRN

## 2019-08-31 NOTE — Telephone Encounter (Signed)
Spoke with patient to review her procedure information for her upcoming heart cath. She verbalized understanding of all instructions. She states that last night she had some heart racing in the 120's which scared her and she took 3 metoprolol. Today she is not feeling well at all and wants to know if she should go to ED. Reviewed that her metoprolol is extended release and her not feeling well may be a result of that extra medication. Instructed her to hold her morning dose of metoprolol for today. Advised that if this happens again over the weekend we do have an on call provider if needed. She verbalized understanding of our conversation, agreement with plan, and had no further questions at this time.

## 2019-09-03 ENCOUNTER — Encounter: Payer: Managed Care, Other (non HMO) | Admitting: Thoracic Surgery (Cardiothoracic Vascular Surgery)

## 2019-09-04 ENCOUNTER — Other Ambulatory Visit: Payer: Managed Care, Other (non HMO)

## 2019-09-04 NOTE — Telephone Encounter (Signed)
Checking with provider to see if he wants this placed after her heart cath.

## 2019-09-04 NOTE — Telephone Encounter (Signed)
She has been battling with tachycardia in the setting of mitral valve stenosis Would consider a ZIO monitor to make sure she is not going into other arrhythmia such as atrial fibrillation

## 2019-09-05 ENCOUNTER — Other Ambulatory Visit
Admission: RE | Admit: 2019-09-05 | Discharge: 2019-09-05 | Disposition: A | Discharge status: Home / Self Care | Payer: Managed Care, Other (non HMO) | Attending: Cardiovascular Disease | Admitting: Cardiovascular Disease

## 2019-09-05 ENCOUNTER — Other Ambulatory Visit: Payer: Self-pay | Admitting: Cardiovascular Disease

## 2019-09-05 DIAGNOSIS — Z01810 Encounter for preprocedural cardiovascular examination: Secondary | ICD-10-CM | POA: Diagnosis not present

## 2019-09-05 DIAGNOSIS — I5032 Chronic diastolic (congestive) heart failure: Secondary | ICD-10-CM

## 2019-09-05 LAB — CBC
HCT: 31.6 % — ABNORMAL LOW (ref 36.0–46.0)
Hemoglobin: 10.2 g/dL — ABNORMAL LOW (ref 12.0–15.0)
MCH: 27.7 pg (ref 26.0–34.0)
MCHC: 32.3 g/dL (ref 30.0–36.0)
MCV: 85.9 fL (ref 80.0–100.0)
Platelets: 323 10*3/uL (ref 150–400)
RBC: 3.68 MIL/uL — ABNORMAL LOW (ref 3.87–5.11)
RDW: 14.8 % (ref 11.5–15.5)
WBC: 7 10*3/uL (ref 4.0–10.5)
nRBC: 0 % (ref 0.0–0.2)

## 2019-09-05 LAB — BASIC METABOLIC PANEL
Anion gap: 13 (ref 5–15)
BUN: 23 mg/dL — ABNORMAL HIGH (ref 6–20)
CO2: 27 mmol/L (ref 22–32)
Calcium: 9.3 mg/dL (ref 8.9–10.3)
Chloride: 97 mmol/L — ABNORMAL LOW (ref 98–111)
Creatinine, Ser: 1.71 mg/dL — ABNORMAL HIGH (ref 0.44–1.00)
GFR calc Af Amer: 39 mL/min — ABNORMAL LOW (ref >60)
GFR calc non Af Amer: 34 mL/min — ABNORMAL LOW (ref >60)
Glucose, Bld: 115 mg/dL — ABNORMAL HIGH (ref 70–99)
Potassium: 3.4 mmol/L — ABNORMAL LOW (ref 3.5–5.1)
Sodium: 137 mmol/L (ref 135–145)

## 2019-09-05 NOTE — Telephone Encounter (Signed)
Patient returning call.

## 2019-09-05 NOTE — Telephone Encounter (Signed)
Verbal discussion to place monitor after she has her procedure. Will call patient and mail it to her so she will have it to place after that has been done.

## 2019-09-05 NOTE — Telephone Encounter (Signed)
Left voicemail message to call back  

## 2019-09-05 NOTE — Telephone Encounter (Signed)
Spoke with patient to confirm lab work has been done today and she is going when she gets off work at 3:30 pm. Confirmed her procedure time for tomorrow along with time to arrive so they can get her ready. Then reviewed that Dr. Rockey Situ requested that she wear a monitor but that we will mail it to her home given that she is having cath tomorrow. She verbalized understanding and reviewed that instructions come with monitor and that if she should have any questions to please give [place] a call. She verbalized understanding of our conversation, agreement with plan, and has no further questions at this time.

## 2019-09-06 ENCOUNTER — Encounter
Admission: RE | Disposition: A | Discharge status: Home / Self Care | Payer: Managed Care, Other (non HMO) | Source: Home / Self Care | Attending: Cardiovascular Disease

## 2019-09-06 ENCOUNTER — Ambulatory Visit
Admission: RE | Admit: 2019-09-06 | Discharge: 2019-09-06 | Disposition: A | Discharge status: Home / Self Care | Payer: Managed Care, Other (non HMO) | Attending: Cardiovascular Disease | Admitting: Cardiovascular Disease

## 2019-09-06 ENCOUNTER — Other Ambulatory Visit: Payer: Self-pay

## 2019-09-06 ENCOUNTER — Encounter: Payer: Self-pay | Admitting: Cardiovascular Disease

## 2019-09-06 DIAGNOSIS — I050 Rheumatic mitral stenosis: Secondary | ICD-10-CM | POA: Diagnosis not present

## 2019-09-06 DIAGNOSIS — K219 Gastro-esophageal reflux disease without esophagitis: Secondary | ICD-10-CM | POA: Insufficient documentation

## 2019-09-06 DIAGNOSIS — E785 Hyperlipidemia, unspecified: Secondary | ICD-10-CM | POA: Insufficient documentation

## 2019-09-06 DIAGNOSIS — J449 Chronic obstructive pulmonary disease, unspecified: Secondary | ICD-10-CM | POA: Insufficient documentation

## 2019-09-06 DIAGNOSIS — I110 Hypertensive heart disease with heart failure: Secondary | ICD-10-CM | POA: Insufficient documentation

## 2019-09-06 DIAGNOSIS — I208 Other forms of angina pectoris: Secondary | ICD-10-CM | POA: Insufficient documentation

## 2019-09-06 DIAGNOSIS — Z6839 Body mass index (BMI) 39.0-39.9, adult: Secondary | ICD-10-CM | POA: Insufficient documentation

## 2019-09-06 DIAGNOSIS — R0683 Snoring: Secondary | ICD-10-CM | POA: Insufficient documentation

## 2019-09-06 DIAGNOSIS — Z8616 Personal history of COVID-19: Secondary | ICD-10-CM | POA: Insufficient documentation

## 2019-09-06 DIAGNOSIS — J849 Interstitial pulmonary disease, unspecified: Secondary | ICD-10-CM | POA: Diagnosis not present

## 2019-09-06 DIAGNOSIS — I480 Paroxysmal atrial fibrillation: Secondary | ICD-10-CM | POA: Diagnosis not present

## 2019-09-06 DIAGNOSIS — I272 Pulmonary hypertension, unspecified: Secondary | ICD-10-CM | POA: Insufficient documentation

## 2019-09-06 DIAGNOSIS — F419 Anxiety disorder, unspecified: Secondary | ICD-10-CM | POA: Insufficient documentation

## 2019-09-06 DIAGNOSIS — I4891 Unspecified atrial fibrillation: Secondary | ICD-10-CM | POA: Diagnosis present

## 2019-09-06 DIAGNOSIS — Z87891 Personal history of nicotine dependence: Secondary | ICD-10-CM | POA: Insufficient documentation

## 2019-09-06 DIAGNOSIS — I200 Unstable angina: Secondary | ICD-10-CM

## 2019-09-06 DIAGNOSIS — Z79899 Other long term (current) drug therapy: Secondary | ICD-10-CM | POA: Diagnosis not present

## 2019-09-06 DIAGNOSIS — I5032 Chronic diastolic (congestive) heart failure: Secondary | ICD-10-CM

## 2019-09-06 DIAGNOSIS — Z7901 Long term (current) use of anticoagulants: Secondary | ICD-10-CM | POA: Diagnosis not present

## 2019-09-06 DIAGNOSIS — I509 Heart failure, unspecified: Secondary | ICD-10-CM

## 2019-09-06 HISTORY — PX: RIGHT/LEFT HEART CATH AND CORONARY ANGIOGRAPHY: CATH118266

## 2019-09-06 SURGERY — RIGHT AND LEFT HEART CATH
Anesthesia: Moderate Sedation

## 2019-09-06 SURGERY — RIGHT/LEFT HEART CATH AND CORONARY ANGIOGRAPHY
Anesthesia: Moderate Sedation | Laterality: Bilateral

## 2019-09-06 MED ORDER — HEPARIN (PORCINE) IN NACL 1000-0.9 UT/500ML-% IV SOLN
INTRAVENOUS | Status: DC | PRN
  Administered 2019-09-06: 500 mL

## 2019-09-06 MED ORDER — LIDOCAINE HCL (PF) 1 % IJ SOLN
INTRAMUSCULAR | Status: DC | PRN
  Administered 2019-09-06: 30 mL
  Administered 2019-09-06: 10 mL

## 2019-09-06 MED ORDER — ASPIRIN 81 MG PO CHEW
81.0000 mg | CHEWABLE_TABLET | ORAL | Status: AC
  Administered 2019-09-06: 81 mg via ORAL

## 2019-09-06 MED ORDER — MIDAZOLAM HCL 2 MG/2ML IJ SOLN
INTRAMUSCULAR | Status: AC
  Filled 2019-09-06: qty 2

## 2019-09-06 MED ORDER — IOHEXOL 300 MG/ML  SOLN
INTRAMUSCULAR | Status: DC | PRN
  Administered 2019-09-06: 85 mL

## 2019-09-06 MED ORDER — MIDAZOLAM HCL 2 MG/2ML IJ SOLN
INTRAMUSCULAR | Status: DC | PRN
  Administered 2019-09-06: 1 mg via INTRAVENOUS

## 2019-09-06 MED ORDER — HEPARIN (PORCINE) IN NACL 1000-0.9 UT/500ML-% IV SOLN
INTRAVENOUS | Status: AC
  Filled 2019-09-06: qty 1000

## 2019-09-06 MED ORDER — FENTANYL CITRATE (PF) 100 MCG/2ML IJ SOLN
INTRAMUSCULAR | Status: AC
  Filled 2019-09-06: qty 2

## 2019-09-06 MED ORDER — FENTANYL CITRATE (PF) 100 MCG/2ML IJ SOLN
INTRAMUSCULAR | Status: DC | PRN
  Administered 2019-09-06: 50 ug via INTRAVENOUS
  Administered 2019-09-06: 25 ug via INTRAVENOUS

## 2019-09-06 MED ORDER — SODIUM CHLORIDE 0.9 % WEIGHT BASED INFUSION
3.0000 mL/kg/h | INTRAVENOUS | Status: AC
  Administered 2019-09-06: 3 mL/kg/h via INTRAVENOUS

## 2019-09-06 MED ORDER — LIDOCAINE HCL (PF) 1 % IJ SOLN
INTRAMUSCULAR | Status: AC
  Filled 2019-09-06: qty 30

## 2019-09-06 MED ORDER — ASPIRIN 81 MG PO CHEW
CHEWABLE_TABLET | ORAL | Status: AC
  Filled 2019-09-06: qty 1

## 2019-09-06 MED ORDER — SODIUM CHLORIDE 0.9 % WEIGHT BASED INFUSION: 1.0000 mL/kg/h | INTRAVENOUS | Status: DC

## 2019-09-06 SURGICAL SUPPLY — 15 items
CANNULA 5F STIFF (CANNULA) ×2 IMPLANT
CATH INFINITI 5 FR 3DRC (CATHETERS) ×2 IMPLANT
CATH INFINITI 5FR JL4 (CATHETERS) ×2 IMPLANT
CATH INFINITI JR4 5F (CATHETERS) ×2 IMPLANT
CATH SWANZ 7F THERMO (CATHETERS) ×2 IMPLANT
DEVICE CLOSURE MYNXGRIP 5F (Vascular Products) ×2 IMPLANT
KIT MANI 3VAL PERCEP (MISCELLANEOUS) ×2 IMPLANT
NEEDLE PERC 18GX7CM (NEEDLE) ×2 IMPLANT
NEEDLE SMART REG 18GX2-3/4 (NEEDLE) ×2 IMPLANT
PACK CARDIAC CATH (CUSTOM PROCEDURE TRAY) ×2 IMPLANT
PANNUS RETENTION SYSTEM 2 PAD (MISCELLANEOUS) ×2 IMPLANT
SHEATH AVANTI 5FR X 11CM (SHEATH) ×2 IMPLANT
SHEATH AVANTI 7FRX11 (SHEATH) ×2 IMPLANT
WIRE GUIDERIGHT .035X150 (WIRE) ×2 IMPLANT
WIRE NITINOL .018 (WIRE) ×2 IMPLANT

## 2019-09-06 NOTE — Progress Notes (Signed)
Sent MD a message re: Creatinine from 8/18 of 1.71. Last creatinine 1.49 from 7/14. MD aware pt. On torsamide QID. No new orders at present. Pt. States she also has a HX of kidney stones from 2020. Pt. Wishes to procede with cath today.

## 2019-09-07 NOTE — Progress Notes (Signed)
Scanned in and placed original copies up front for FMLA.Disability paperwork. Beth

## 2019-09-09 DIAGNOSIS — U071 COVID-19: Secondary | ICD-10-CM | POA: Insufficient documentation

## 2019-09-10 ENCOUNTER — Encounter: Payer: Self-pay | Admitting: Thoracic Surgery (Cardiothoracic Vascular Surgery)

## 2019-09-10 ENCOUNTER — Institutional Professional Consult (permissible substitution): Payer: Managed Care, Other (non HMO) | Admitting: Thoracic Surgery (Cardiothoracic Vascular Surgery)

## 2019-09-10 ENCOUNTER — Other Ambulatory Visit: Payer: Self-pay | Admitting: Thoracic Surgery (Cardiothoracic Vascular Surgery)

## 2019-09-10 ENCOUNTER — Other Ambulatory Visit: Payer: Self-pay

## 2019-09-10 VITALS — BP 115/63 | HR 89 | Temp 97.9°F | Resp 20 | Ht 64.0 in | Wt 236.0 lb

## 2019-09-10 DIAGNOSIS — I050 Rheumatic mitral stenosis: Secondary | ICD-10-CM | POA: Diagnosis not present

## 2019-09-10 NOTE — H&P (Signed)
H&P Addendum, precardiac catheterization  Patient was seen and evaluated prior to Cardiac catheterization procedure Symptoms, prior testing details again confirmed with the patient Patient examined, no significant change from prior exam Lab work reviewed in detail personally by myself Patient understands risk and benefit of the procedure, willing to proceed  Signed, Tim Shaunika Italiano, MD, Ph.D [facility]    

## 2019-09-10 NOTE — Patient Instructions (Addendum)
Continue all previous medications without any changes at this time  Check your weight on a regular basis and keep a log for your records.  Look for signs of fluid overload such as worsening swelling of your lower legs, increased shortness of breath with activity, and/or a dry nonproductive cough.  Discussed these findings with your cardiologist including whether or not you should adjust your fluid pill dosage (diuretic).  Endocarditis is a potentially serious infection of heart valves or inside lining of the heart.  It occurs more commonly in patients with diseased heart valves (such as patient's with aortic or mitral valve disease) and in patients who have undergone heart valve repair or replacement.  Certain surgical and dental procedures may put you at risk, such as dental cleaning, other dental procedures, or any surgery involving the respiratory, urinary, gastrointestinal tract, gallbladder or prostate gland.   To minimize your chances for develooping endocarditis, maintain good oral health and seek prompt medical attention for any infections involving the mouth, teeth, gums, skin or urinary tract.    Always notify your doctor or dentist about your underlying heart valve condition before having any invasive procedures. You will need to take antibiotics before certain procedures, including all routine dental cleanings or other dental procedures.  Your cardiologist or dentist should prescribe these antibiotics for you to be taken ahead of time.      

## 2019-09-10 NOTE — Progress Notes (Signed)
[facility]  MULTIDISCIPLINARY HEART VALVE CLINIC  CARDIOTHORACIC SURGERY CONSULTATION REPORT  Referring Provider is Gollan, Kathlene November, MD PCP is Ronnell Freshwater, NP  Chief Complaint  Patient presents with  . Mitral Stenosis    Surgical consult, Cardiac Cath  09/06/19, ECHO 06/27/2019    HPI:  Patient is 54 year old morbidly obese African-American female with history of mitral stenosis, chronic diastolic congestive heart failure, persistent atrial fibrillation currently maintaining sinus rhythm on long-term warfarin anticoagulation, hypertension, hyperlipidemia, GE reflux disease, kidney stones, and possible interstitial lung disease who has been referred for surgical consultation to discuss treatment options for management of severe symptomatic mitral stenosis.  Patient reports being told that she had a heart murmur many years ago.  She denies any known history of rheumatic fever or rheumatic heart disease.  She reports a long history of intermittent tachypalpitations and exertional shortness of breath dating back several years.  In August 2019 she was hospitalized with atrial fibrillation with rapid ventricular response and acute exacerbation of likely chronic diastolic congestive heart failure.  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  She was started on warfarin anticoagulation and has been followed intermittently ever since by Dr. Rockey Situ.  She has had episodes of intermittent tachypalpitations associated with worsening shortness of breath that have increased in frequency and severity in recent months.  She has developed progressive symptoms of exertional shortness of breath and fluid retention requiring escalating doses of diuretic therapy.  Recent follow-up transthoracic echocardiogram performed June 27, 2019 revealed findings consistent with further progression in the severity of mitral stenosis with mean transvalvular gradient estimated 10 mmHg at a heart rate of 66 bpm.  Gross appearance of the mitral valve appeared consistent with rheumatic disease with mitral valve area by planimetry measured only 1.05 cm.  Left ventricular systolic function remained normal with ejection fraction estimated 65 to 70%.  The patient subsequently underwent left and right heart catheterization September 06, 2019.  Catheterization revealed normal coronary artery anatomy with no significant coronary artery disease.  There was moderate pulmonary hypertension with PA pressures measured 52/26 and pulmonary capillary wedge pressure ranging between 26 and 31 mmHg.  Cardiothoracic surgical consultation was requested.  Patient is married and lives locally in [place] with her husband.  She works full-time for [employer] where she has worked for more than 26 years.  She has been obese for the majority of her adult life.  She complains of a gradual progression of symptoms of exertional shortness of breath over many years.  The patient states that over the last 6 months she has gotten to the point where she cannot do much of anything without getting short of breath.  She cannot go up a flight of stairs without having to stop and take a break.  She has had some lower extremity edema requiring increasing doses of oral diuretic.  She recently had a prolonged episode of tachypalpitations associated with resting shortness of breath and severe orthopnea.  Symptoms lasted for several hours prompting her to take extra doses of metoprolol.  Symptoms eventually subsided when her pulse returned to normal.  Intermittent palpitations have increased in frequency in recent months, but the patient also notes that she gets short of breath even when her heart rate is slow and regular.  Past Medical History:  Diagnosis Date  . (HFpEF) heart  failure with  preserved ejection fraction ([place])    a. 08/2017 Echo: EF 55-60%.  Grade 2 diastolic dysfunction.  Moderate mitral stenosis.  . Allergy   . Anemia   . Anxiety   . BRCA negative 03/22/2013  . Bronchitis 02/19/2016   ON LEVAQUIN PO  . Chest tightness   . Cigarette smoker 09/11/2017   8-10 day  . Dyspnea   . GERD (gastroesophageal reflux disease)   . History of kidney stones   . Hyperlipidemia   . Hypertension   . Interstitial lung disease ([place])    a. CT 2013 b. 02/2018 CXR noted recurrent intersistial changes ILD vs chronic bronchitis  . Moderate mitral stenosis    a.  08/2017 TEE: EF 60 to 65%.  Moderate mitral stenosis.  Mean gradient 14 mmHg.  Valve area 2.59 cm by planimetry, 2.72 cm by pressure half-time.  . Persistent atrial fibrillation ([place])    a. 08/2017 s/p TEE/DCCV; b. CHA2DS2VASc = 2-->warfarin.    Past Surgical History:  Procedure Laterality Date  . ABDOMINAL HYSTERECTOMY     total  . ABDOMINAL HYSTERECTOMY    . BREAST BIOPSY Bilateral 2012  . BREAST BIOPSY Right 08-07-12   fibroadenomatous changes and columnar cells  . BREAST BIOPSY  02/03/2015   stereo byrnett  . CHOLECYSTECTOMY N/A 02/27/2016   Procedure: LAPAROSCOPIC CHOLECYSTECTOMY;  Surgeon: Christene Lye, MD;  Location: [facility];  Service: General;  Laterality: N/A;  . COLONOSCOPY WITH PROPOFOL N/A 09/26/2018   Procedure: COLONOSCOPY WITH PROPOFOL;  Surgeon: Lucilla Lame, MD;  Location: [facility];  Service: Endoscopy;  Laterality: N/A;  . CYSTOSCOPY W/ RETROGRADES Right 08/18/2018   Procedure: CYSTOSCOPY WITH RETROGRADE PYELOGRAM;  Surgeon: Billey Co, MD;  Location: [facility];  Service: Urology;  Laterality: Right;  . CYSTOSCOPY/URETEROSCOPY/HOLMIUM LASER/STENT PLACEMENT Right 08/18/2018   Procedure: CYSTOSCOPY/URETEROSCOPY/STENT PLACEMENT;  Surgeon: Billey Co, MD;  Location: [facility];  Service: Urology;  Laterality: Right;  . ESOPHAGOGASTRODUODENOSCOPY (EGD) WITH PROPOFOL N/A 09/26/2018    Procedure: ESOPHAGOGASTRODUODENOSCOPY (EGD) WITH PROPOFOL;  Surgeon: Lucilla Lame, MD;  Location: [facility];  Service: Endoscopy;  Laterality: N/A;  . GIVENS CAPSULE STUDY N/A 11/03/2018   Procedure: GIVENS CAPSULE STUDY;  Surgeon: Lucilla Lame, MD;  Location: [facility];  Service: Endoscopy;  Laterality: N/A;  . JOINT REPLACEMENT Left    knee  . KNEE ARTHROPLASTY Right 04/09/2019   Procedure: COMPUTER ASSISTED TOTAL KNEE ARTHROPLASTY;  Surgeon: Dereck Leep, MD;  Location: [facility];  Service: Orthopedics;  Laterality: Right;  . KNEE CLOSED REDUCTION Left 04/15/2015   Procedure: CLOSED MANIPULATION KNEE;  Surgeon: Thornton Park, MD;  Location: [facility];  Service: Orthopedics;  Laterality: Left;  . KNEE SURGERY    . RIGHT/LEFT HEART CATH AND CORONARY ANGIOGRAPHY Bilateral 09/06/2019   Procedure: RIGHT/LEFT HEART CATH AND CORONARY ANGIOGRAPHY;  Surgeon: Minna Merritts, MD;  Location: [facility];  Service: Cardiovascular;  Laterality: Bilateral;  . TEE WITHOUT CARDIOVERSION N/A 09/13/2017   Procedure: TRANSESOPHAGEAL ECHOCARDIOGRAM (TEE);  Surgeon: Nelva Bush, MD;  Location: [facility];  Service: Cardiovascular;  Laterality: N/A;  . TOTAL KNEE ARTHROPLASTY Left 12/25/2014   Procedure: TOTAL KNEE ARTHROPLASTY;  Surgeon: Thornton Park, MD;  Location: [facility];  Service: Orthopedics;  Laterality: Left;  . TUBAL LIGATION      Family History  Problem Relation Age of Onset  . Cancer Mother 76       breast  . Hypertension Mother   . Cancer Maternal Aunt  breast  . Cancer Maternal Grandmother        breast  . Osteoarthritis Father   . Hypertension Father     Social History   Socioeconomic History  . Marital status: Married    Spouse name: Not on file  . Number of children: Not on file  . Years of education: Not on file  . Highest education level: Not on file  Occupational History  . Not on file  Tobacco Use  . Smoking status: Former Smoker     Packs/day: 0.50    Years: 20.00    Pack years: 10.00    Types: Cigarettes    Quit date: 02/20/2018    Years since quitting: 1.5  . Smokeless tobacco: Never Used  Vaping Use  . Vaping Use: Never used  Substance and Sexual Activity  . Alcohol use: Yes    Comment: rarely  . Drug use: No  . Sexual activity: Not on file  Other Topics Concern  . Not on file  Social History Narrative  . Not on file   Social Determinants of Health   Financial Resource Strain:   . Difficulty of Paying Living Expenses: Not on file  Food Insecurity:   . Worried About Charity fundraiser in the Last Year: Not on file  . Ran Out of Food in the Last Year: Not on file  Transportation Needs:   . Lack of Transportation (Medical): Not on file  . Lack of Transportation (Non-Medical): Not on file  Physical Activity:   . Days of Exercise per Week: Not on file  . Minutes of Exercise per Session: Not on file  Stress:   . Feeling of Stress : Not on file  Social Connections:   . Frequency of Communication with Friends and Family: Not on file  . Frequency of Social Gatherings with Friends and Family: Not on file  . Attends Religious Services: Not on file  . Active Member of Clubs or Organizations: Not on file  . Attends Archivist Meetings: Not on file  . Marital Status: Not on file  Intimate Partner Violence:   . Fear of Current or Ex-Partner: Not on file  . Emotionally Abused: Not on file  . Physically Abused: Not on file  . Sexually Abused: Not on file    Current Outpatient Medications  Medication Sig Dispense Refill  . acetaminophen (TYLENOL) 500 MG tablet Take 1,000 mg by mouth every 6 (six) hours as needed for moderate pain or fever.    . ALPRAZolam (XANAX) 0.5 MG tablet Take 1 tablet (0.5 mg total) by mouth at bedtime as needed for anxiety. 30 tablet 2  . atorvastatin (LIPITOR) 80 MG tablet Take 80 mg by mouth daily.    . calcium carbonate (TUMS - DOSED IN MG ELEMENTAL CALCIUM) 500 MG  chewable tablet Chew 1 tablet by mouth daily as needed for indigestion or heartburn.    . diltiazem (CARDIZEM CD) 120 MG 24 hr capsule Take 1 capsule (120 mg total) by mouth daily. 90 capsule 3  . fluticasone (FLONASE) 50 MCG/ACT nasal spray Place 1 spray into both nostrils daily as needed for allergies or rhinitis.    . metoprolol succinate (TOPROL-XL) 50 MG 24 hr tablet Take 50-100 mg by mouth See admin instructions. Take 50 mg in the morning and 100 mg at night    . omeprazole (PRILOSEC) 40 MG capsule Take 1 capsule (40 mg total) by mouth daily. 90 capsule 1  . potassium chloride (KLOR-CON)  10 MEQ tablet Take 20-30 mEq by mouth See admin instructions. Take 30 meq in the morning and 20 meq at night    . torsemide (DEMADEX) 20 MG tablet Take 2 tablets (40 mg total) by mouth 2 (two) times daily. 360 tablet 3  . warfarin (COUMADIN) 2.5 MG tablet Take 0.5 tablets (1.25 mg total) by mouth See admin instructions. Take 0.5 tablet (1.25 mg) by mouth on Tuesdays & Thursdays. (Patient taking differently: Take 1.25 mg by mouth daily. ) 30 tablet 0   No current facility-administered medications for this visit.    Allergies  Allergen Reactions  . Morphine Hives and Rash  . Oxycodone Hcl Hives and Itching  . Dilaudid [Hydromorphone Hcl] Itching      Review of Systems:   General:  normal appetite, decreased energy, no weight gain, no weight loss, no fever  Cardiac:  + chest pain with exertion, no chest pain at rest, +SOB with exertion, occasional resting SOB that occur with tachypalpitations, no PND, + orthopnea, + palpitations, + arrhythmia, + atrial fibrillation, + LE edema, no dizzy spells, no syncope  Respiratory:  + shortness of breath, no home oxygen, no productive cough, + intermittent dry cough, no bronchitis, no wheezing, no hemoptysis, no asthma, no pain with inspiration or cough, no sleep apnea, no CPAP at night  GI:   no difficulty swallowing, no reflux, no frequent heartburn, no hiatal  hernia, no abdominal pain, no constipation, no diarrhea, no hematochezia, no hematemesis, no melena  GU:   no dysuria,  no frequency, no urinary tract infection, no hematuria, + kidney stones, + kidney disease  Vascular:  no pain suggestive of claudication, no pain in feet, no leg cramps, no varicose veins, no DVT, no non-healing foot ulcer  Neuro:   no stroke, no TIA's, no seizures, no headaches, no temporary blindness one eye,  no slurred speech, no peripheral neuropathy, no chronic pain, no instability of gait, no memory/cognitive dysfunction  Musculoskeletal: + arthritis, no joint swelling, no myalgias, no difficulty walking, normal mobility   Skin:   no rash, no itching, no skin infections, no pressure sores or ulcerations  Psych:   no anxiety, no depression, no nervousness, no unusual recent stress  Eyes:   no blurry vision, no floaters, no recent vision changes, + wears glasses or contacts  ENT:   no hearing loss, no loose or painful teeth, no dentures, last saw dentist many years ago  Hematologic:  no easy bruising, no abnormal bleeding, no clotting disorder, no frequent epistaxis  Endocrine:  no diabetes, does not check CBG's at home           Physical Exam:   BP 115/63 (BP Location: Left Arm, Patient Position: Sitting, Cuff Size: Large)   Pulse 89   Temp 97.9 F (36.6 C) (Temporal)   Resp 20   Ht '5\' 4"'  (1.626 m)   Wt 236 lb (107 kg)   SpO2 99% Comment: RA  BMI 40.51 kg/m   General:  Morbidly obese,  well-appearing  HEENT:  Unremarkable   Neck:   no JVD, no bruits, no adenopathy   Chest:   clear to auscultation, symmetrical breath sounds, no wheezes, no rhonchi   CV:   RRR, no murmur  Abdomen:  soft, non-tender, no masses   Extremities:  warm, well-perfused, pulses diminished, + LE edema  Rectal/GU  Deferred  Neuro:   Grossly non-focal and symmetrical throughout  Skin:   Clean and dry, no rashes, no breakdown  Diagnostic Tests:  ECHOCARDIOGRAM REPORT        Patient Name:  ANGI GOODELL Date of Exam: 06/27/2019  Medical Rec #: [id number]   Height:    64.0 in  Accession #:  [id number]  Weight:    233.0 lb  Date of Birth: 05/24/1965   BSA:     2.088 m  Patient Age:  41 years   BP:      138/90 mmHg  Patient Gender: F       HR:      64 bpm.  Exam Location: [place]   Procedure: 2D Echo, Cardiac Doppler, Color Doppler and Intracardiac       Opacification Agent   Indications:  I34.2 Nonrheumatic mitral (valve) stenosis    History:    Patient has prior history of Echocardiogram examinations,  most         recent 08/29/2018. CHF, CAD, Arrythmias:Atrial  Fibrillation; Risk         Factors:Former Smoker, Hypertension and Dyslipidemia.    Sonographer:  Pilar Jarvis RDMS, RVT, RDCS  Referring Phys: [place]    1. Left ventricular ejection fraction, by estimation, is 65 to 70%. The  left ventricle has normal function. The left ventricle has no regional  wall motion abnormalities. Left ventricular diastolic parameters are  consistent with Grade II diastolic  dysfunction (pseudonormalization).  2. Right ventricular systolic function is normal. The right ventricular  size is normal. There is moderately elevated pulmonary artery systolic  pressure.  3. Left atrial size was moderately dilated.  4. Mitral valve mean gradient is 22mHg at heart rate 66bpm. The mitral  valve is degenerative. Mild mitral valve regurgitation. Severe mitral  stenosis.  5. The aortic valve is normal in structure. Aortic valve regurgitation is  not visualized.  6. The inferior vena cava is normal in size with <50% respiratory  variability, suggesting right atrial pressure of 8 mmHg.   FINDINGS  Left Ventricle: Left ventricular ejection fraction, by estimation, is 65  to 70%. The left ventricle has normal function. The left ventricle has no  regional wall  motion abnormalities. Definity contrast agent was given IV  to delineate the left ventricular  endocardial borders. The left ventricular internal cavity size was normal  in size. There is no left ventricular hypertrophy. Left ventricular  diastolic parameters are consistent with Grade II diastolic dysfunction  (pseudonormalization).   Right Ventricle: The right ventricular size is normal. No increase in  right ventricular wall thickness. Right ventricular systolic function is  normal. There is moderately elevated pulmonary artery systolic pressure.  The tricuspid regurgitant velocity is  3.35 m/s, and with an assumed right atrial pressure of 8 mmHg, the  estimated right ventricular systolic pressure is 517.5mmHg.   Left Atrium: Left atrial size was moderately dilated.   Right Atrium: Right atrial size was normal in size.   Pericardium: There is no evidence of pericardial effusion.   Mitral Valve: Mitral valve mean gradient is 177mg at heart rate 66bpm.  The mitral valve is degenerative in appearance. Mild mitral valve  regurgitation. Severe mitral valve stenosis. Mitral valve area by  planimetry is 1.05 cm, and by the pressure  half-time method is calculated to be 2.51 cm. The mean transmitral  gradient is 9.3 mmHg. MV peak gradient, 25.6 mmHg. The mean mitral valve  gradient is 9.3 mmHg.   Tricuspid Valve: The tricuspid valve is normal in structure. Tricuspid  valve regurgitation is not demonstrated.  Aortic Valve: The aortic valve is normal in structure. Aortic valve  regurgitation is not visualized. Aortic valve mean gradient measures 6.0  mmHg. Aortic valve peak gradient measures 9.5 mmHg. Aortic valve area, by  VTI measures 2.61 cm.   Pulmonic Valve: The pulmonic valve was not well visualized. Pulmonic valve  regurgitation is not visualized.   Aorta: The aortic root is normal in size and structure.   Venous: The inferior vena cava is normal in size with less than  50%  respiratory variability, suggesting right atrial pressure of 8 mmHg.   IAS/Shunts: No atrial level shunt detected by color flow Doppler.     LEFT VENTRICLE  PLAX 2D  LVIDd:     4.10 cm Diastology  LVIDs:     2.30 cm LV e' lateral:  4.30 cm/s  LV PW:     1.20 cm LV E/e' lateral: 50.0  LV IVS:    1.00 cm LV e' medial:  3.57 cm/s  LVOT diam:   2.00 cm LV E/e' medial: 60.2  LV SV:     104  LV SV Index:  50  LVOT Area:   3.14 cm     RIGHT VENTRICLE      IVC  RV Basal diam: 3.40 cm  IVC diam: 1.30 cm  RV S prime:   9.89 cm/s  TAPSE (M-mode): 2.5 cm   LEFT ATRIUM       Index    RIGHT ATRIUM      Index  LA diam:    3.20 cm 1.53 cm/m RA Area:   19.20 cm  LA Vol (A2C):  94.9 ml 45.46 ml/m RA Volume:  60.00 ml 28.74 ml/m  LA Vol (A4C):  77.2 ml 36.98 ml/m  LA Biplane Vol: 90.4 ml 43.30 ml/m  AORTIC VALVE          PULMONIC VALVE  AV Area (Vmax):  2.63 cm   PV Vmax:    1.06 m/s  AV Area (Vmean):  2.53 cm   PV Peak grad: 4.5 mmHg  AV Area (VTI):   2.61 cm  AV Vmax:      154.00 cm/s  AV Vmean:     111.000 cm/s  AV VTI:      0.397 m  AV Peak Grad:   9.5 mmHg  AV Mean Grad:   6.0 mmHg  LVOT Vmax:     129.00 cm/s  LVOT Vmean:    89.500 cm/s  LVOT VTI:     0.330 m  LVOT/AV VTI ratio: 0.83    AORTA  Ao Root diam: 2.90 cm  Ao Asc diam: 2.40 cm  Ao Arch diam: 2.6 cm   MITRAL VALVE        TRICUSPID VALVE  MV Area (PHT): 2.51 cm  TR Peak grad:  44.9 mmHg  MV Area (plan): 1.05 cm  TR Vmax:    335.00 cm/s  MV Peak grad:  25.6 mmHg  MV Mean grad:  9.3 mmHg  SHUNTS  MV Vmax:    2.53 m/s  Systemic VTI: 0.33 m  MV Vmean:    140.7 cm/s Systemic Diam: 2.00 cm  MV Decel Time: 302 msec  MV E velocity: 215.00 cm/s  MV A velocity: 208.00 cm/s  MV E/A ratio: 1.03   Kate Sable MD  Electronically signed by  Kate Sable MD  Signature Date/Time: 06/27/2019/4:32:26 PM       RIGHT/LEFT HEART CATH AND CORONARY ANGIOGRAPHY  Conclusion   Hemodynamic findings consistent with moderate pulmonary  hypertension.   Indications  Mitral valve stenosis, unspecified etiology [I05.0 (ICD-10-CM)]  Procedural Details  Technical Details Cardiac Catheterization Procedure Note  Name: Rossy Virag MRN: [id number] DOB: 10/14/1965  Procedure: Left Heart Cath, Selective Coronary Angiography, LV angiography, right heart catheterization  Indication:   Mataya L Foustis a 54 y.o.femalewith a hx of  mitral valve stenosis morbid obesity,  Snores, possible OSA anxiety,  hyperlipidemia,  Hospital admission 09/11/2017 for  shortness of breath chest tightness in atrial fibrillation with RVR Had TEE  , Cardioversion Mild COPD, long hx of smoking Cardiac CTA Coronary calcium score of 10. Who presents for cardiac catheterization for work-up of her pulmonary hypertension, severe mitral valve stenosis, arrhythmia, chest tightness/unstable angina     Procedural details: The right groin was prepped, draped, and anesthetized with 1% lidocaine. Using modified Seldinger technique, a 5 French sheath was introduced into the right femoral artery. Standard Judkins catheters (JL 4, JR 4 and pigtail catheter) were used for coronary angiography and left ventriculography.  7 French catheter laced into right femoral vein for Swan-Ganz catheter and assessment of right heart pressures.  Left heart catheter exchanges were performed over a guidewire. There were no immediate procedural complications. The patient was transferred to the post catheterization recovery area for further monitoring.  Difficulty accessing right femoral artery secondary to vessel was deep, located next to the vein, Doppler ultrasound needle used in conjunction with portable Doppler for visualization of the vessel  Moderate sedation: 1. Sedation used: 1 mg  versed, 75 ug versed 2. Time of administration:       10:30 am     Time patient left for recovery 11:30 am Total sedation time 60 min 3. I was Face to Face with the patient during this time: (code: [id number])   Procedural Findings:    Coronary angiography:  Coronary dominance: Right  Left mainstem:   Large vessel that bifurcates into the LAD and left circumflex, no significant disease noted  Left anterior descending (LAD):   Large vessel that extends to the apical region, diagonal branch 2 of moderate size, no significant disease noted  Left circumflex (LCx):  Large vessel with OM branch 2, no significant disease noted  Right coronary artery (RCA):  Right dominant vessel with PL and PDA, no significant disease noted  Left ventriculogram was not performed secondary to renal dysfunction Aortic valve was crossed for pressures, no significant aortic valve stenosis Left ventricular end-diastolic pressure 26 mmHg  RA : 11 RV: 58/7/16 PA: 52/26/39 Wedge 26-31 mmHg, ectopy appreciated LV 145/16/26 Ao 120/55/76 Cardiac output 4.23 Cardiac index 2.02  Final Conclusions:   No significant coronary artery disease Moderately elevated right heart pressures consistent with moderate pulmonary hypertension Recent echocardiogram with severe mitral valve stenosis  Recommendations:  We will recommend referral to CT surgery for consideration of mitral valve stenosis evaluation, valve replacement Scheduled to have a Zio monitor given continued paroxysmal tachycardia despite high doses of metoprolol She is on high doses of torsemide with worsening renal dysfunction and still with markedly elevated right heart pressures consistent with worsening mitral valve stenosis    Ida Rogue 09/06/2019, 11:18 AM  Estimated blood loss <50 mL.   During this procedure medications were administered to achieve and maintain moderate conscious sedation while the patient's heart rate, blood pressure, and  oxygen saturation were continuously monitored and I was present face-to-face 100% of this time.  Medications (Filter: Administrations occurring from 0948 to 1129 on 09/06/19) fentaNYL (SUBLIMAZE) injection (mcg) Total dose:  75  mcg Date/Time  Rate/Dose/Volume Action  09/06/19 1008  25 mcg Given  1037  50 mcg Given    midazolam (VERSED) injection (mg) Total dose:  1 mg Date/Time  Rate/Dose/Volume Action  09/06/19 1008  1 mg Given    lidocaine (PF) (XYLOCAINE) 1 % injection (mL) Total volume:  40 mL Date/Time  Rate/Dose/Volume Action  09/06/19 1013  30 mL Given  1041  10 mL Given    Heparin (Porcine) in NaCl 1000-0.9 UT/500ML-% SOLN (mL) Total volume:  500 mL Date/Time  Rate/Dose/Volume Action  09/06/19 1111  500 mL Given    iohexol (OMNIPAQUE) 300 MG/ML solution (mL) Total volume:  85 mL Date/Time  Rate/Dose/Volume Action  09/06/19 1112  85 mL Given    Sedation Time  Sedation Time Physician-1: 58 minutes 29 seconds  Contrast  Medication Name Total Dose  iohexol (OMNIPAQUE) 300 MG/ML solution 85 mL    Radiation/Fluoro  Fluoro time: 9.5 (min) DAP: 44.5 (Gycm2) Cumulative Air Kerma: 605 (mGy)  Coronary Findings  Diagnostic Dominance: Right No diagnostic findings have been documented. Intervention  No interventions have been documented. Right Heart  Right Heart Pressures Hemodynamic findings consistent with moderate pulmonary hypertension. Elevated LV EDP consistent with volume overload.  Coronary Diagrams  Diagnostic Dominance: Right  Intervention  Implants   Vascular Products  Device Closure Mynxgrip [id number] Implanted Inventory item: DEVICE CLOSURE MYNXGRIP 78F Model/Cat number: [id number] Manufacturer: [employer] Lot number: [id number] Device identifier: [id number] identifier type: GS1  Area Of Implantation: Groin    GUDID Information  Request status Successful    Brand name: MHarper University HospitalVersion/Model: [id number] Company name:  [employer] MRI safety info as of 09/06/19: MR Safe  Contains dry or latex rubber: No    GMDN P.T. name: Wound hydrogel dressing, non-antimicrobial    As of 09/06/2019  Status: Implanted      Syngo Images  Show images for CARDIAC CATHETERIZATION Images on Long Term Storage  Show images for FJeriah, Skufcato Procedure Log  Procedure Log    Hemo Data (last day) before discharge   AO Systolic Cath Pressure  AO Diastolic Cath Pressure  AO Mean Cath Pressure  LV Systolic Cath Pressure  LV End Diastolic  LV Systolic  LV End Diastolic  LV dP/dt  PA Systolic Cath Pressure  PA Diastolic Cath Pressure  PA Mean Cath Pressure  RA Wedge A Wave  RA Wedge V Wave  RV Systolic Cath Pressure  RV Diastolic Cath Pressure  RV End Diastolic  RV Systolic  RV End Diastolic  RV dP/dt  PCW A Wave  PCW V Wave  PCW Mean  AO O2 Sat  PA O2 Sat  AO O2 Sat  Fick C.O.  Fick C.I.   --  --  --  --  --  145 mmHg  26 mmHg  1200 mmHg/sec  --  --  --  15 mmHg  12 mmHg  --  --  --  58 mmHg  16 mmHg  432 mmHg/sec  35 mmHg  37 mmHg  31 mmHg  --  --  --  4.23 L/min  2.02 L/min/m2   --  --  --  --  --  --  --  --  55 mmHg  31 mmHg  42 mmHg  --  --  --  --  --  --  --  --  --  --  --  --  --  --  --  --   --  --  --  --  --  --  --  --  52 mmHg  26 mmHg  39 mmHg  --  --  --  --  --  --  --  --  --  --  --  --  --  --  --  --   --  --  --  --  --  --  --  --  --  --  --  --  --  --  --  --  --  --  --  --  --  --  95.3 %  MV  SA  --  --   --  --  --  --  --  --  --  --  --  --  --  --  --  58 mmHg  7 mmHg  16 mmHg  --  --  --  --  --  --  --  --  --  --  --   130  61 mmHg  89 mmHg  --  --  --  --  --  --  --  --  --  --  --  --  --  --  --  --  --  --  --  --  --  --  --  --   --  --  --  132 mmHg  10 mmHg  --  --  --  --  --  --  --  --  --  --  --  --  --  --  --  --  --  --  --  --  --  --   --  --  --  126 mmHg  11 mmHg  --  --  --  --  --  --  --  --  --  --  --  --  --  --  --  --  --  --  --  --  --  --   --  --   --  145 mmHg  26 mmHg  --  --  --  --  --  --  --  --  --  --  --  --  --  --  --  --  --  --  --  --  --  --   120  55 mmHg  76 mmHg  --  --                                                  Impression:  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  She describes a long history of progressive symptoms of exertional shortness of breath, fatigue, orthopnea, and lower extremity edema consistent with chronic diastolic congestive heart failure, recently New York Heart Association functional class III.  The patient also has history of paroxysmal atrial fibrillation with recently increasing episodes of tachypalpitations associated with resting shortness of breath and orthopnea.  I have personally reviewed the patient's most recent transthoracic echocardiogram and diagnostic cardiac catheterization.  Echocardiogram reveals normal left ventricular size and systolic function with moderate left atrial enlargement and severe mitral stenosis.  Acoustic windows on recent transthoracic echocardiogram are somewhat suboptimal, but functional anatomy appears consistent with rheumatic disease with at least moderate thickening and restricted leaflet mobility involving both leaflets of the mitral valve.  Mean transvalvular gradient across mitral  valve is been estimated greater than 10 mmHg and by planimetry mitral valve area was estimated only 1.09 cm.  There appears to be only mild mitral regurgitation.  Diagnostic cardiac catheterization was notable for the absence of significant coronary artery disease and confirmed the presence of moderate pulmonary hypertension.  I agree the patient needs surgical intervention.  Options include follow-up transesophageal echocardiogram to evaluate whether or not the patient might be candidate for an attempt at balloon mitral valvuloplasty versus proceeding directly to definitive  surgical intervention with mitral valve replacement and concomitant Maze procedure.   Plan:  The patient and her husband were counseled at length regarding the options for treatment of severe symptomatic mitral stenosis and recurrent paroxysmal atrial fibrillation.  The natural history of rheumatic mitral valve disease was discussed.  Long-term prognosis with continued medical therapy without some type of surgical intervention were discussed.  We directly reviewed images from the patient's recent transthoracic echocardiogram and discussed the pathophysiology of disease.  The possibility of proceeding with TEE to consider whether or not an attempt at balloon mitral valvuloplasty might be feasible was discussed.  Surgical options were discussed including elective mitral valve replacement with or without concomitant Maze procedure.  Expectations for the patient's postoperative convalescence following uncomplicated elective surgery were reviewed.  All of their questions have been addressed.  The patient is interested in proceeding with definitive surgical intervention including mitral valve replacement and Maze procedure in the near future.  The patient and her husband desire to discuss the timing of surgery further, particularly with regards to whether or not they will proceed with plans they had made for traveling to [place] in the near future.  I have expressed concern that traveling under the current circumstances could be problematic.  I have also recommended the patient schedule an appointment with her dentist for routine dental examination, cleaning, and preoperative clearance.  We have offered to refer her directly to our dental service clinic if desired.  CT angiography will be performed to evaluate the feasibility of peripheral cannulation for surgery.  The patient and her husband will return to our office for further follow-up and discussions to schedule elective surgery in 3 weeks.  I spent in  excess of 90 minutes during the conduct of this office consultation and >50% of this time involved direct face-to-face encounter with the patient for counseling and/or coordination of their care.      Valentina Gu. Roxy Manns, MD 09/10/2019 9:31 AM

## 2019-09-10 NOTE — Interval H&P Note (Signed)
History and Physical Interval Note:  09/10/2019 12:15 PM  Leslie [name]  has presented today for surgery, with the diagnosis of RT and LT Cath   pulmonary HTN, unstable angina, mitral valve stenosis.  The various methods of treatment have been discussed with the patient and family. After consideration of risks, benefits and other options for treatment, the patient has consented to  Procedure(s): RIGHT/LEFT HEART CATH AND CORONARY ANGIOGRAPHY (Bilateral) as a surgical intervention.  The patient's history has been reviewed, patient examined, no change in status, stable for surgery.  I have reviewed the patient's chart and labs.  Questions were answered to the patient's satisfaction.     Leslie [name]

## 2019-09-11 ENCOUNTER — Other Ambulatory Visit: Payer: Self-pay | Admitting: Physician Assistant

## 2019-09-14 ENCOUNTER — Telehealth (HOSPITAL_COMMUNITY): Payer: Self-pay

## 2019-09-14 NOTE — Telephone Encounter (Signed)
I called patient to schedule New OP Appt for Dental Medicine on 09-17-19. Patient stated she needed more of a notice for work. I explained we would have to call her if an appt time opened up for a later date. Patient voiced understanding.

## 2019-09-17 ENCOUNTER — Other Ambulatory Visit: Payer: Self-pay | Admitting: Cardiovascular Disease

## 2019-09-17 NOTE — Telephone Encounter (Signed)
Refill request

## 2019-09-18 ENCOUNTER — Telehealth: Payer: Self-pay

## 2019-09-18 NOTE — Telephone Encounter (Signed)
Lmom to confirm and screen for 09-20-19 ov.

## 2019-09-20 ENCOUNTER — Ambulatory Visit: Payer: Managed Care, Other (non HMO) | Admitting: Nurse Practitioner

## 2019-09-20 ENCOUNTER — Encounter: Payer: Self-pay | Admitting: Nurse Practitioner

## 2019-09-20 ENCOUNTER — Other Ambulatory Visit: Payer: Self-pay

## 2019-09-20 VITALS — BP 138/92 | HR 76 | Temp 97.3°F | Resp 16 | Ht 64.0 in | Wt 232.2 lb

## 2019-09-20 DIAGNOSIS — Z6839 Body mass index (BMI) 39.0-39.9, adult: Secondary | ICD-10-CM

## 2019-09-20 DIAGNOSIS — I509 Heart failure, unspecified: Secondary | ICD-10-CM | POA: Diagnosis not present

## 2019-09-20 DIAGNOSIS — B3731 Acute candidiasis of vulva and vagina: Secondary | ICD-10-CM

## 2019-09-20 DIAGNOSIS — B373 Candidiasis of vulva and vagina: Secondary | ICD-10-CM

## 2019-09-20 DIAGNOSIS — I480 Paroxysmal atrial fibrillation: Secondary | ICD-10-CM

## 2019-09-20 MED ORDER — FLUCONAZOLE 150 MG PO TABS: ORAL_TABLET | ORAL | 1 refills | Status: DC

## 2019-09-20 MED ORDER — NYSTATIN 100000 UNIT/GM EX OINT: 1.0000 "application " | TOPICAL_OINTMENT | Freq: Two times a day (BID) | CUTANEOUS | 1 refills | Status: DC

## 2019-09-20 NOTE — Telephone Encounter (Signed)
No answer. Left message to call back.   

## 2019-09-20 NOTE — Progress Notes (Signed)
[facility] [place], [place] [address]  Internal MEDICINE  Office Visit Note  Patient Name: Leslie [name]  [id number]  [id number]  Date of Service: 09/30/2019  Chief Complaint  Patient presents with  . Follow-up    memory issues  . Gastroesophageal Reflux  . Hyperlipidemia  . Hypertension  . Quality Metric Gaps    tetnaus, flu vaccine    The patient is here for follow up visit. She states that she is getting ready to have another surgery. Will have mitral valve replacement. Believe this is causing her chronic atrial fib. She had cardiac catheterizations last week. Will have to have her teeth looked at and CTA of chest/abdomen prior to surgery. She states that all in all she is doing ok. Had to have another COVID 19 test done prior to her heart catheterization. This test was negative.  The patient does state that her allergies have been bothering her. She states that she believes this is related to allergies.  She has brought appeal form from her employer for exemption for increase insurance premiums to go up due to elevated BMI. Due to surgeries and illness over the past several months, she has been unable to maintain the BMI. She knows that in the future, after recovering from her mitral valve replacement surgery, she should limit calorie intake and gradually increase physical activity.       Current Medication: Outpatient Encounter Medications as of 09/20/2019  Medication Sig  . acetaminophen (TYLENOL) 500 MG tablet Take 1,000 mg by mouth every 6 (six) hours as needed for moderate pain or fever.  . ALPRAZolam (XANAX) 0.5 MG tablet Take 1 tablet (0.5 mg total) by mouth at bedtime as needed for anxiety.  Marland Kitchen atorvastatin (LIPITOR) 80 MG tablet TAKE 1 TABLET BY MOUTH EVERY DAY AT 6 PM  . calcium carbonate (TUMS - DOSED IN MG ELEMENTAL CALCIUM) 500 MG chewable tablet Chew 1 tablet by mouth daily as needed for indigestion or heartburn.  . diltiazem (CARDIZEM CD)  120 MG 24 hr capsule Take 1 capsule (120 mg total) by mouth daily.  . fluticasone (FLONASE) 50 MCG/ACT nasal spray Place 1 spray into both nostrils daily as needed for allergies or rhinitis.  . metoprolol succinate (TOPROL-XL) 50 MG 24 hr tablet Take 50-100 mg by mouth See admin instructions. Take 50 mg in the morning and 100 mg at night  . omeprazole (PRILOSEC) 40 MG capsule Take 1 capsule (40 mg total) by mouth daily.  . potassium chloride (KLOR-CON) 10 MEQ tablet Take 20-30 mEq by mouth See admin instructions. Take 30 meq in the morning and 20 meq at night  . torsemide (DEMADEX) 20 MG tablet Take 2 tablets (40 mg total) by mouth 2 (two) times daily.  Marland Kitchen warfarin (COUMADIN) 2.5 MG tablet TAKE AS DIRECTED BY ANTI-COAG CLINIC  . fluconazole (DIFLUCAN) 150 MG tablet Take 1 tablet po once. May repeat dose in 3 days as needed for persistent symptoms.  . [DISCONTINUED] nystatin ointment (MYCOSTATIN) Apply 1 application topically 2 (two) times daily. (Patient not taking: Reported on 09/28/2019)   No facility-administered encounter medications on file as of 09/20/2019.    Surgical History: Past Surgical History:  Procedure Laterality Date  . ABDOMINAL HYSTERECTOMY     total  . ABDOMINAL HYSTERECTOMY    . BREAST BIOPSY Bilateral 2012  . BREAST BIOPSY Right 08-07-12   fibroadenomatous changes and columnar cells  . BREAST BIOPSY  02/03/2015   stereo byrnett  . CHOLECYSTECTOMY N/A  02/27/2016   Procedure: LAPAROSCOPIC CHOLECYSTECTOMY;  Surgeon: Christene Lye, MD;  Location: [facility] ORS;  Service: General;  Laterality: N/A;  . COLONOSCOPY WITH PROPOFOL N/A 09/26/2018   Procedure: COLONOSCOPY WITH PROPOFOL;  Surgeon: Lucilla Lame, MD;  Location: [facility] ENDOSCOPY;  Service: Endoscopy;  Laterality: N/A;  . CYSTOSCOPY W/ RETROGRADES Right 08/18/2018   Procedure: CYSTOSCOPY WITH RETROGRADE PYELOGRAM;  Surgeon: Billey Co, MD;  Location: [facility] ORS;  Service: Urology;  Laterality: Right;  .  CYSTOSCOPY/URETEROSCOPY/HOLMIUM LASER/STENT PLACEMENT Right 08/18/2018   Procedure: CYSTOSCOPY/URETEROSCOPY/STENT PLACEMENT;  Surgeon: Billey Co, MD;  Location: [facility] ORS;  Service: Urology;  Laterality: Right;  . ESOPHAGOGASTRODUODENOSCOPY (EGD) WITH PROPOFOL N/A 09/26/2018   Procedure: ESOPHAGOGASTRODUODENOSCOPY (EGD) WITH PROPOFOL;  Surgeon: Lucilla Lame, MD;  Location: [facility] ENDOSCOPY;  Service: Endoscopy;  Laterality: N/A;  . GIVENS CAPSULE STUDY N/A 11/03/2018   Procedure: GIVENS CAPSULE STUDY;  Surgeon: Lucilla Lame, MD;  Location: [facility] ENDOSCOPY;  Service: Endoscopy;  Laterality: N/A;  . JOINT REPLACEMENT Left    knee  . KNEE ARTHROPLASTY Right 04/09/2019   Procedure: COMPUTER ASSISTED TOTAL KNEE ARTHROPLASTY;  Surgeon: Dereck Leep, MD;  Location: [facility] ORS;  Service: Orthopedics;  Laterality: Right;  . KNEE CLOSED REDUCTION Left 04/15/2015   Procedure: CLOSED MANIPULATION KNEE;  Surgeon: Thornton Park, MD;  Location: [facility] ORS;  Service: Orthopedics;  Laterality: Left;  . KNEE SURGERY    . RIGHT/LEFT HEART CATH AND CORONARY ANGIOGRAPHY Bilateral 09/06/2019   Procedure: RIGHT/LEFT HEART CATH AND CORONARY ANGIOGRAPHY;  Surgeon: Minna Merritts, MD;  Location: [place] [facility];  Service: Cardiovascular;  Laterality: Bilateral;  . TEE WITHOUT CARDIOVERSION N/A 09/13/2017   Procedure: TRANSESOPHAGEAL ECHOCARDIOGRAM (TEE);  Surgeon: Nelva Bush, MD;  Location: [facility] ORS;  Service: Cardiovascular;  Laterality: N/A;  . TOTAL KNEE ARTHROPLASTY Left 12/25/2014   Procedure: TOTAL KNEE ARTHROPLASTY;  Surgeon: Thornton Park, MD;  Location: [facility] ORS;  Service: Orthopedics;  Laterality: Left;  . TUBAL LIGATION      Medical History: Past Medical History:  Diagnosis Date  . (HFpEF) heart failure with preserved ejection fraction ([place])    a. 08/2017 Echo: EF 55-60%.  Grade 2 diastolic dysfunction.  Moderate mitral stenosis.  . Allergy   . Anemia   . Anxiety   . BRCA negative  03/22/2013  . Bronchitis 02/19/2016   ON LEVAQUIN PO  . Chest tightness   . Cigarette smoker 09/11/2017   8-10 day  . Dyspnea   . GERD (gastroesophageal reflux disease)   . History of kidney stones   . Hyperlipidemia   . Hypertension   . Interstitial lung disease ([place])    a. CT 2013 b. 02/2018 CXR noted recurrent intersistial changes ILD vs chronic bronchitis  . Moderate mitral stenosis    a.  08/2017 TEE: EF 60 to 65%.  Moderate mitral stenosis.  Mean gradient 14 mmHg.  Valve area 2.59 cm by planimetry, 2.72 cm by pressure half-time.  . Persistent atrial fibrillation ([place])    a. 08/2017 s/p TEE/DCCV; b. CHA2DS2VASc = 2-->warfarin.    Family History: Family History  Problem Relation Age of Onset  . Cancer Mother 60       breast  . Hypertension Mother   . Cancer Maternal Aunt        breast  . Cancer Maternal Grandmother        breast  . Osteoarthritis Father   . Hypertension Father     Social History   Socioeconomic History  . Marital status:  Married    Spouse name: Not on file  . Number of children: Not on file  . Years of education: Not on file  . Highest education level: Not on file  Occupational History  . Not on file  Tobacco Use  . Smoking status: Former Smoker    Packs/day: 0.50    Years: 20.00    Pack years: 10.00    Types: Cigarettes    Quit date: 02/20/2018    Years since quitting: 1.6  . Smokeless tobacco: Never Used  Vaping Use  . Vaping Use: Never used  Substance and Sexual Activity  . Alcohol use: Yes    Comment: rarely  . Drug use: No  . Sexual activity: Not on file  Other Topics Concern  . Not on file  Social History Narrative  . Not on file   Social Determinants of Health   Financial Resource Strain:   . Difficulty of Paying Living Expenses: Not on file  Food Insecurity:   . Worried About Charity fundraiser in the Last Year: Not on file  . Ran Out of Food in the Last Year: Not on file  Transportation Needs:   . Lack of  Transportation (Medical): Not on file  . Lack of Transportation (Non-Medical): Not on file  Physical Activity:   . Days of Exercise per Week: Not on file  . Minutes of Exercise per Session: Not on file  Stress:   . Feeling of Stress : Not on file  Social Connections:   . Frequency of Communication with Friends and Family: Not on file  . Frequency of Social Gatherings with Friends and Family: Not on file  . Attends Religious Services: Not on file  . Active Member of Clubs or Organizations: Not on file  . Attends Archivist Meetings: Not on file  . Marital Status: Not on file  Intimate Partner Violence:   . Fear of Current or Ex-Partner: Not on file  . Emotionally Abused: Not on file  . Physically Abused: Not on file  . Sexually Abused: Not on file      Review of Systems  Constitutional: Positive for fatigue. Negative for activity change, chills, fever and unexpected weight change.  HENT: Positive for congestion, postnasal drip and rhinorrhea. Negative for sinus pressure, sinus pain, sneezing and sore throat.   Respiratory: Positive for cough. Negative for chest tightness and shortness of breath.   Cardiovascular: Negative for chest pain and palpitations.  Gastrointestinal: Negative for abdominal pain, constipation, diarrhea, nausea and vomiting.  Endocrine: Negative for cold intolerance, heat intolerance, polydipsia and polyuria.  Genitourinary: Positive for vaginal discharge. Negative for dysuria, flank pain, frequency and urgency.       Vaginal itching and irritation.   Musculoskeletal: Positive for arthralgias, back pain and myalgias. Negative for joint swelling and neck pain.  Skin: Negative for rash.  Allergic/Immunologic: Positive for environmental allergies.  Neurological: Negative for tremors, numbness and headaches.  Hematological: Negative for adenopathy. Does not bruise/bleed easily.  Psychiatric/Behavioral: Negative for behavioral problems (Depression), sleep  disturbance and suicidal ideas. The patient is nervous/anxious.     Today's Vitals   09/20/19 0923  BP: (!) 138/92  Pulse: 76  Resp: 16  Temp: (!) 97.3 F (36.3 C)  SpO2: 99%  Weight: 232 lb 3.2 oz (105.3 kg)  Height: '5\' 4"'  (1.626 m)   Body mass index is 39.86 kg/m.  Physical Exam Vitals and nursing note reviewed.  Constitutional:      General: She  is not in acute distress.    Appearance: Normal appearance. She is well-developed. She is obese. She is not diaphoretic.  HENT:     Head: Normocephalic and atraumatic.     Nose: Nose normal.     Mouth/Throat:     Pharynx: No oropharyngeal exudate.  Eyes:     Pupils: Pupils are equal, round, and reactive to light.  Neck:     Thyroid: No thyromegaly.     Vascular: No JVD.     Trachea: No tracheal deviation.  Cardiovascular:     Rate and Rhythm: Normal rate and regular rhythm.     Heart sounds: Murmur heard.  No friction rub. No gallop.   Pulmonary:     Effort: Pulmonary effort is normal. No respiratory distress.     Breath sounds: Normal breath sounds. No wheezing or rales.  Chest:     Chest wall: No tenderness.  Abdominal:     Palpations: Abdomen is soft.  Genitourinary:    Comments: Urine sample negative for infection or other abnormalities.  Musculoskeletal:        General: Normal range of motion.     Cervical back: Normal range of motion and neck supple.  Lymphadenopathy:     Cervical: No cervical adenopathy.  Skin:    General: Skin is warm and dry.  Neurological:     Mental Status: She is alert and oriented to person, place, and time.     Cranial Nerves: No cranial nerve deficit.  Psychiatric:        Attention and Perception: Attention and perception normal.        Mood and Affect: Affect normal. Mood is anxious.        Speech: Speech normal.        Behavior: Behavior normal. Behavior is cooperative.        Thought Content: Thought content normal.        Cognition and Memory: Cognition and memory normal.         Judgment: Judgment normal.    Assessment/Plan: 1. Vaginal candidiasis May take diflucan 126m one time. May repeat the dose in three days for persistent symptoms. Use nystatin cream two to three times daily as needed.  - fluconazole (DIFLUCAN) 150 MG tablet; Take 1 tablet po once. May repeat dose in 3 days as needed for persistent symptoms.  Dispense: 3 tablet; Refill: 1  2. Paroxysmal atrial fibrillation ([place] Patient to continue with regular visits with cardiology.   3. Congestive heart failure, unspecified HF chronicity, unspecified heart failure type ([place] Patient to continue with regular visits with cardiology.   4. BMI 39.0-39.9,adult Patient has had chronic and acute health conditions preventing her from being able to participate in regular physical activities. Once recovered from upcoming surgery, will recommend 1200-1500 caloire diet and gradually increasing physical activity. [employer] screening exemption form to be completed and returned to patient as soon as possible.   General Counseling: Jakyla verbalizes understanding of the findings of todays visit and agrees with plan of treatment. I have discussed any further diagnostic evaluation that may be needed or ordered today. We also reviewed her medications today. she has been encouraged to call the office with any questions or concerns that should arise related to todays visit.  Obesity Counseling: Risk Assessment: An assessment of behavioral risk factors was made today and includes lack of exercise sedentary lifestyle, lack of portion control and poor dietary habits.  Risk Modification Advice: She was counseled on portion control guidelines. Restricting daily  caloric intake to. . The detrimental long term effects of obesity on her health and ongoing poor compliance was also discussed with the patient.   This patient was seen by Gibson with Dr Lavera Guise as a part of collaborative care  agreement   Meds ordered this encounter  Medications  . fluconazole (DIFLUCAN) 150 MG tablet    Sig: Take 1 tablet po once. May repeat dose in 3 days as needed for persistent symptoms.    Dispense:  3 tablet    Refill:  1    Order Specific Question:   Supervising Provider    Answer:   Lavera Guise [[id number]]  . DISCONTD: nystatin ointment (MYCOSTATIN)    Sig: Apply 1 application topically 2 (two) times daily.    Dispense:  30 g    Refill:  1    Order Specific Question:   Supervising Provider    Answer:   Lavera Guise [[id number]]    Total time spent: 30 Minutes   Time spent includes review of chart, medications, test results, and follow up plan with the patient.      Dr Lavera Guise Internal medicine

## 2019-09-20 NOTE — Telephone Encounter (Signed)
Patient calling back. She wants to fly to [place] at the end of September, around 10/18/19. Wants to make sure it is ok in relation to her heart and that the pressure in the cabin of the airplane would not affect her heart.  Routing to Dr Rockey Situ for review and advice.

## 2019-09-26 ENCOUNTER — Ambulatory Visit (INDEPENDENT_AMBULATORY_CARE_PROVIDER_SITE_OTHER): Payer: Managed Care, Other (non HMO)

## 2019-09-26 ENCOUNTER — Other Ambulatory Visit: Payer: Self-pay

## 2019-09-26 DIAGNOSIS — Z5181 Encounter for therapeutic drug level monitoring: Secondary | ICD-10-CM

## 2019-09-26 DIAGNOSIS — I050 Rheumatic mitral stenosis: Secondary | ICD-10-CM

## 2019-09-26 DIAGNOSIS — I4891 Unspecified atrial fibrillation: Secondary | ICD-10-CM

## 2019-09-26 LAB — POCT INR: INR: 2 (ref 2.0–3.0)

## 2019-09-26 NOTE — Patient Instructions (Signed)
-   continue warfarin dosage of 1.25 mg (half the 2.5 mg tablet) every day.   - recheck INR in 6 weeks 

## 2019-09-27 ENCOUNTER — Telehealth: Payer: Self-pay | Admitting: Cardiovascular Disease

## 2019-09-27 NOTE — Telephone Encounter (Signed)
This is not a [facility]mad[facility] Clinic telephone call/message.  This needs to be addressed by pt's MD or their nurse.  Will route call to [facility] office triage to be addressed since pt's MD is Dr Rockey Situ.

## 2019-09-27 NOTE — Telephone Encounter (Signed)
Spoke with patient's husband, ok per DPR. Advised him it would benefit him to come with patient to her next appointment in order to speak to the doctor and ask him questions. He is very concerned about his wife and wants her to get better. She was present as well.  Rescheduled her appointment for tomorrow and patient and husband will be there.

## 2019-09-27 NOTE — Telephone Encounter (Signed)
Patients husband walked in today wanting to speak with Coumadin nurse, he was told her is not her. Patients husband wants to know "where her bad heart comes from". He is just concerned about her and wants to know what can be done  Please advise when able

## 2019-09-28 ENCOUNTER — Other Ambulatory Visit: Payer: Self-pay

## 2019-09-28 ENCOUNTER — Ambulatory Visit (INDEPENDENT_AMBULATORY_CARE_PROVIDER_SITE_OTHER): Payer: Managed Care, Other (non HMO) | Admitting: Family

## 2019-09-28 ENCOUNTER — Encounter: Payer: Self-pay | Admitting: Family

## 2019-09-28 VITALS — BP 120/62 | HR 60 | Ht 64.0 in | Wt 236.4 lb

## 2019-09-28 DIAGNOSIS — I050 Rheumatic mitral stenosis: Secondary | ICD-10-CM

## 2019-09-28 DIAGNOSIS — I5032 Chronic diastolic (congestive) heart failure: Secondary | ICD-10-CM

## 2019-09-28 DIAGNOSIS — I480 Paroxysmal atrial fibrillation: Secondary | ICD-10-CM

## 2019-09-28 DIAGNOSIS — Z7901 Long term (current) use of anticoagulants: Secondary | ICD-10-CM | POA: Diagnosis not present

## 2019-09-28 NOTE — Patient Instructions (Signed)
Medication Instructions:  No medication changes today.   You may take your second dose of Torsemide around 2 pm in the afternoon.   *If you need a refill on your cardiac medications before your next appointment, please call your pharmacy*  Lab Work: No lab work today.   Testing/Procedures: Your EKG today shows normal sinus rhythm.   Follow-Up: At [facility], you and your health needs are our priority.  As part of our continuing mission to provide you with exceptional heart care, we have created designated Provider Care Teams.  These Care Teams include your primary Cardiologist (physician) and Advanced Practice Providers (APPs -  Physician Assistants and Nurse Practitioners) who all work together to provide you with the care you need, when you need it.  We recommend signing up for the patient portal called "MyChart".  Sign up information is provided on this After Visit Summary.  MyChart is used to connect with patients for Virtual Visits (Telemedicine).  Patients are able to view lab/test results, encounter notes, upcoming appointments, etc.  Non-urgent messages can be sent to your provider as well.   To learn more about what you can do with MyChart, go to [email or web address].    Your next appointment:   As previously scheduled.  Other Instructions  Mitral Valve Stenosis  Mitral valve stenosis is a narrowing of the mitral valve, which is the valve between the upper left chamber (left atrium) and the lower left chamber (left ventricle) of the heart. This condition can limit blood flow between the left atrium and the left ventricle. If you have this condition, your health care provider may hear an abnormal sound (heart murmur) while listening to your heart. This condition can range from mild to severe. What are the causes? This condition may be caused by:  Rheumatic fever, which is a complication of a strep infection.  A buildup of calcium around the valve. This can occur with  aging.  A problem that is present at birth (congenital defect).  Certain long-term (chronic) diseases. What are the signs or symptoms? Symptoms of this condition include:  Shortness of breath.  Cough.  Noisy breathing (wheezing).  Tiredness (fatigue) or decreased energy.  Fast or irregular heartbeats (palpitations).  Heart murmur.  Chest pain.  Pain in the arm, neck, jaw, or face.  Swollen feet or ankles.  Hoarse voice.  Pink and purple patches of skin on the face. How is this diagnosed? This condition may be diagnosed based on:  A physical exam. Your health care provider will check for a heart murmur by listening to your heart.  Tests, including: ? A test that creates ultrasound images of the heart that allow your health care provider to see how the heart valves work while your heart is beating (echocardiogram). ? A test that records the electrical impulses of the heart (electrocardiogram). ? Exercise stress tests. These are tests that evaluate the blood supply to your heart and your heart's response to exercise. ? A test to look at the structure and function of the heart (cardiac catheterization). A thin tube (catheter) is passed through the blood vessels and into the heart. Dye is injected into the blood vessels so the cardiac system can be seen on images that are taken. ? Chest X-rays. How is this treated? Treatment for this condition depends on the severity of the condition. It may include:  Medicines to keep the heart rate regular.  Medicines to control blood pressure.  Blood thinners (anticoagulants) to prevent blood clots.  Antibiotic medicines to prevent infections. Defective heart valves are more likely to become infected.  A procedure in which a catheter is passed through a blood vessel to the heart and then a tiny balloon is inflated to open the mitral valve (percutaneous balloon valvuloplasty).  Open heart surgery to repair or replace the mitral  valve. Follow these instructions at home: Medicines  Take over-the-counter and prescription medicines only as told by your health care provider.  If you were prescribed an antibiotic medicine, take it as told by your health care provider. Do not stop taking the antibiotic even if you start to feel better.  If you are taking blood thinners: ? Talk with your health care provider before you take any medicines that contain aspirin or NSAIDs. These medicines increase your risk for dangerous bleeding. ? Take your medicine exactly as told, at the same time every day. ? Avoid activities that could cause injury or bruising. ? Follow instructions about how to prevent falls. ? Wear a medical alert bracelet or carry a card that lists what medicines you take. Lifestyle  Do not use any products that contain nicotine or tobacco, such as cigarettes, e-cigarettes, and chewing tobacco. If you need help quitting, ask your health care provider.  Achieve and maintain a healthy weight.  Ask your health care provider what kinds of exercise are safe for you.  If you are taking blood thinners, avoid activities that have a high risk of injury. Ask your health care provider what activities are safe for you. Eating and drinking  Limit your intake of caffeine and alcohol. Both of these substances can affect your heart's rate and rhythm.  If you drink alcohol: ? Limit how much you use to:  0-1 drink a day for nonpregnant women.  0-2 drinks a day for men. ? Be aware of how much alcohol is in your drink. In the [place] one drink equals one 12 oz bottle of beer (355 mL), one 5 oz glass of wine (148 mL), or one 1 oz glass of hard liquor (44 mL).  Eat a heart-healthy diet that includes plenty of fresh fruits and vegetables, whole grains, low-fat (lean) proteins, and low-fat or nonfat dairy products. Consider working with a dietitian to help you make healthy food choices.  Limit the amount of salt (sodium) in your  diet. Avoid adding salt to foods, and avoid foods that are high in salt, such as: ? Pickles. ? Smoked and cured meats. ? Processed foods.  General instructions  Before any dental procedures, tell your dentist that you have mitral valve stenosis. You may need antibiotics to prevent a heart infection.  If you plan to become pregnant, talk with your health care provider first.  Keep all follow-up visits as told by your health care provider. This is important. Contact a health care provider if you:  Have a fever.  Feel more tired than usual when doing physical activity.  Have a dry cough. Get help right away if you:  Have pain or pressure in your chest that does not go away.  Have difficulty breathing.  Have palpitations.  Have a sudden weight gain.  Have swelling in your feet, ankles, or legs.  Have trouble staying awake or you faint.  Feel confused. These symptoms may represent a serious problem that is an emergency. Do not wait to see if the symptoms will go away. Get medical help right away. Call your local emergency services (911 in the [place] Do not drive yourself to  the hospital. Summary  Mitral valve stenosis is a narrowing of the mitral valve. This condition can limit blood flow on the left side of your heart.  Depending on how severe your condition is, you may be treated with medicines, a procedure, or surgery.  Practice heart-healthy habits to manage this condition. These include limiting alcohol, avoiding nicotine and tobacco, and eating a heart-healthy diet that is low in salt (sodium). This information is not intended to replace advice given to you by your health care provider. Make sure you discuss any questions you have with your health care provider. Document Revised: 12/18/2017 Document Reviewed: 12/18/2017 Elsevier Patient Education  2020 [employer].

## 2019-09-28 NOTE — Progress Notes (Signed)
Office Visit    Patient Name: Leslie [name] Date of Encounter: 09/29/2019  Primary Care Provider:  Ronnell Freshwater, NP Primary Cardiologist:  Ida Rogue, MD Electrophysiologist:  None   Chief Complaint    Leslie [name] is a 54 y.o. female with a hx of HFpEF, severe mitral stenosis, GERD, PAF on Warfarin, nonobstructive coronary disease by cardiac CTA 02/2018 with LHC 08/2019 with no evidence of coronary disease, HTN, HLD, tobacco use, obesity, renal stones, anxiety presents today for follow up of severe MV stenosis.   Past Medical History    Past Medical History:  Diagnosis Date  . (HFpEF) heart failure with preserved ejection fraction ([place])    a. 08/2017 Echo: EF 55-60%.  Grade 2 diastolic dysfunction.  Moderate mitral stenosis.  . Allergy   . Anemia   . Anxiety   . BRCA negative 03/22/2013  . Bronchitis 02/19/2016   ON LEVAQUIN PO  . Chest tightness   . Cigarette smoker 09/11/2017   8-10 day  . Dyspnea   . GERD (gastroesophageal reflux disease)   . History of kidney stones   . Hyperlipidemia   . Hypertension   . Interstitial lung disease ([place])    a. CT 2013 b. 02/2018 CXR noted recurrent intersistial changes ILD vs chronic bronchitis  . Moderate mitral stenosis    a.  08/2017 TEE: EF 60 to 65%.  Moderate mitral stenosis.  Mean gradient 14 mmHg.  Valve area 2.59 cm by planimetry, 2.72 cm by pressure half-time.  . Persistent atrial fibrillation ([place])    a. 08/2017 s/p TEE/DCCV; b. CHA2DS2VASc = 2-->warfarin.   Past Surgical History:  Procedure Laterality Date  . ABDOMINAL HYSTERECTOMY     total  . ABDOMINAL HYSTERECTOMY    . BREAST BIOPSY Bilateral 2012  . BREAST BIOPSY Right 08-07-12   fibroadenomatous changes and columnar cells  . BREAST BIOPSY  02/03/2015   stereo byrnett  . CHOLECYSTECTOMY N/A 02/27/2016   Procedure: LAPAROSCOPIC CHOLECYSTECTOMY;  Surgeon: Christene Lye, MD;  Location: [facility] ORS;  Service: General;  Laterality: N/A;  . COLONOSCOPY  WITH PROPOFOL N/A 09/26/2018   Procedure: COLONOSCOPY WITH PROPOFOL;  Surgeon: Lucilla Lame, MD;  Location: [facility] ENDOSCOPY;  Service: Endoscopy;  Laterality: N/A;  . CYSTOSCOPY W/ RETROGRADES Right 08/18/2018   Procedure: CYSTOSCOPY WITH RETROGRADE PYELOGRAM;  Surgeon: Billey Co, MD;  Location: [facility] ORS;  Service: Urology;  Laterality: Right;  . CYSTOSCOPY/URETEROSCOPY/HOLMIUM LASER/STENT PLACEMENT Right 08/18/2018   Procedure: CYSTOSCOPY/URETEROSCOPY/STENT PLACEMENT;  Surgeon: Billey Co, MD;  Location: [facility] ORS;  Service: Urology;  Laterality: Right;  . ESOPHAGOGASTRODUODENOSCOPY (EGD) WITH PROPOFOL N/A 09/26/2018   Procedure: ESOPHAGOGASTRODUODENOSCOPY (EGD) WITH PROPOFOL;  Surgeon: Lucilla Lame, MD;  Location: [facility] ENDOSCOPY;  Service: Endoscopy;  Laterality: N/A;  . GIVENS CAPSULE STUDY N/A 11/03/2018   Procedure: GIVENS CAPSULE STUDY;  Surgeon: Lucilla Lame, MD;  Location: [facility] END[facility]COPY;  Service: Endoscopy;  Laterality: N/A;  . JOINT REPLACEMENT Left    knee  . KNEE ARTHROPLASTY Right 04/09/2019   Procedure: COMPUTER ASSISTED TOTAL KNEE ARTHROPLASTY;  Surgeon: Dereck Leep, MD;  Location: [facility] ORS;  Service: Orthopedics;  Laterality: Right;  . KNEE CLOSED REDUCTION Left 04/15/2015   Procedure: CLOSED MANIPULATION KNEE;  Surgeon: Thornton Park, MD;  Location: [facility] ORS;  Service: Orthopedics;  Laterality: Left;  . KNEE SURGERY    . RIGHT/LEFT HEART CATH AND CORONARY ANGIOGRAPHY Bilateral 09/06/2019   Procedure: RIGHT/LEFT HEART CATH AND CORONARY ANGIOGRAPHY;  Surgeon: Minna Merritts, MD;  Location:  [facility];  Service: Cardiovascular;  Laterality: Bilateral;  . TEE WITHOUT CARDIOVERSION N/A 09/13/2017   Procedure: TRANSESOPHAGEAL ECHOCARDIOGRAM (TEE);  Surgeon: Nelva Bush, MD;  Location: [facility] ORS;  Service: Cardiovascular;  Laterality: N/A;  . TOTAL KNEE ARTHROPLASTY Left 12/25/2014   Procedure: TOTAL KNEE ARTHROPLASTY;  Surgeon: Thornton Park, MD;  Location:  [facility] ORS;  Service: Orthopedics;  Laterality: Left;  . TUBAL LIGATION      Allergies  Allergies  Allergen Reactions  . Morphine Hives and Rash  . Oxycodone Hcl Hives and Itching  . Dilaudid [Hydromorphone Hcl] Itching    History of Present Illness    Leslie [name] is a 54 y.o. female with a hx of HFpEF, severe mitral stenosis, GERD, PAF on Warfarin, nonobstructive coronary disease by cardiac CTA 02/2018 with LHC 08/2019 with no evidence of coronary disease, HTN, HLD, tobacco use, obesity, renal stones, anxiety last seen for cardiac catheterization.  Her atrial fibrillation was diagnosed in August 2018 in setting of hospitalization for chest tightness.  TEE with moderate mitral stenosis.  She underwent cardioversion and was anticoagulated on acute medicine in setting of valvular atrial fed.  She had coronary CTA 02/2018 due to chest tightness with nonobstructive CAD, calcium score of 10 placing her in the 89th percentile for age and sex, and dilated pulmonary artery suggestive of pulmonary hypertension.  More ZIO monitor January 2020 with predominantly no sinus rhythm, SVT/atrial tachycardia occurred.  She was seen in clinic 08/07/2018 noting weight gain and increasing dyspnea.  She was recommended for echocardiogram and her Lasix was increased.  Underwent cystoscopy 08/08/2018.  She was noted to the hospital 08/26/2018 with sepsis likely secondary to acute pyelonephritis, AKI, acute respiratory failure with hypoxia due to acute on chronic diastolic heart failure. She underwent echocardiogram during admission 08/29/2018 with EF greater than 65%, indeterminate diastolic parameters, RV mildly enlarged with normal systolic function, LA mildly dilated, moderate to severe mitral valve stenosis with mean gradient of 16 mmHg.  Over last year she has been undergoing work-up for  anemia with GI including colonoscopy and capsule study.  She underwent knee replacement 04/09/2019.  Echo 06/27/2019 EF 65 to 70%, grade  2 diastolic dysfunction, moderately elevated PASP, LA moderately dilated, mild MR with severe mitral stenosis with mean gradient of 10 mmHg.  She has been seen in follow-up routinely with multiple adjustments to antihypertensive therapies as well as her diuretic therapies.  She has had difficulties with fluid retention, breathing, palpitations.  She had telemedicine visit 07/04/19 and was interested in pursuing more aggressive approach for her mitral valve given worsening symptoms over the previous 6 months.  She had right and left heart cath 09/06/2018 with no significant coronaries, moderately elevated right heart pressures consistent with moderate pulmonary hypertension.  She saw Dr. Roxy Manns for surgery consult 09/10/2019.  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  She has dental procedure and CT upcoming next week for her preoperative work-up. Plan for MVR and MAZE.  She notices increasing palpitations a few times per week which last a few minutes and self resolve. She has been taking Toprol 143m BID. Reports stable DOE. No SOB at rest. No chest pain, pressure, tightness. No LE edema, orthopnea, PND.   [place]. They have cancelled their flight. They are still planning to drive down to  [place]. We discussed the importance of her workup for mitral valve replacement. She has dental exam and CT next week followed by an appointment with Dr. Roxy Manns. Long discussion regarding the pathophysiology, symptoms, and treatment of mitral valve stenosis.    EKGs/Labs/Other Studies Reviewed:   The following studies were reviewed today:  [facility] 09/06/19 Cardiac Catheterization Procedure Note  Name: Cassi Jenne MRN: [id number] DOB: Mar 14, 1965  Procedure: Left Heart Cath, Selective Coronary Angiography, LV angiography, right  heart catheterization  Indication:  ARCHER VISE is a 54 y.o. female with a hx of  mitral valve stenosis morbid obesity,  Snores, possible OSA anxiety,  hyperlipidemia,  Hospital admission 09/11/2017 for shortness of breath chest tightness in atrial fibrillation with RVR Had TEE , Cardioversion Mild COPD, long hx of smoking Cardiac CTA Coronary calcium score of 10. Who presents for cardiac catheterization for work-up of her pulmonary hypertension, severe mitral valve stenosis, arrhythmia, chest tightness/unstable angina     Procedural details: The right groin was prepped, draped, and anesthetized with 1% lidocaine. Using modified Seldinger technique, a 5 French sheath was introduced into the right femoral artery. Standard Judkins catheters (JL 4, JR 4 and pigtail catheter) were used for coronary angiography and left ventriculography. 7 French catheter laced into right femoral vein for Swan-Ganz catheter and assessment of right heart pressures. Left heart catheter exchanges were performed over a guidewire. There were no immediate procedural complications. The patient was transferred to the post catheterization recovery area for further monitoring.  Difficulty accessing right femoral artery secondary to vessel was deep, located next to the vein, Doppler ultrasound needle used in conjunction with portable Doppler for visualization of the vessel  Moderate sedation: 1. Sedation used: 1 mg versed, 75 ug versed 2. Time of administration: 10:30 am Time patient left for recovery 11:30 am Total sedation time 60 min 3. I was Face to Face with the patient during this time: (code: [phone])   Procedural Findings:  Coronary angiography:  Coronary dominance: Right  Left mainstem: Large vessel that bifurcates into the LAD and left circumflex, no significant disease noted  Left anterior descending (LAD): Large vessel that extends to the apical region, diagonal branch 2 of moderate size, no  significant disease noted  Left circumflex (LCx): Large vessel with OM branch 2, no significant disease noted  Right coronary artery (RCA): Right dominant vessel with PL and PDA, no significant disease noted  Left ventriculogram was not performed secondary to renal dysfunction Aortic valve was crossed for pressures, no significant aortic valve stenosis Left ventricular end-diastolic pressure 26 mmHg  RA : 11 RV: 58/7/16 PA: 52/26/39 Wedge 26-31 mmHg, ectopy appreciated LV 145/16/26 Ao 120/55/76 Cardiac output 4.23 Cardiac index 2.02  Final Conclusions:  No significant coronary artery disease Moderately elevated right heart pressures consistent with moderate pulmonary hypertension Recent echocardiogram with severe mitral valve stenosis  Recommendations:  We will recommend referral to CT surgery for consideration of mitral valve stenosis evaluation, valve replacement Scheduled to have a Zio monitor given continued paroxysmal tachycardia despite high doses of metoprolol She is on high doses of torsemide with worsening renal dysfunction and still with markedly elevated right heart pressures consistent with worsening mitral valve stenosis   Echo 06/27/19 1. Left ventricular ejection fraction, by estimation, is 65 to 70%. The  left ventricle has normal function. The left ventricle has no regional  wall motion abnormalities. Left ventricular diastolic parameters are  consistent with Grade II diastolic  dysfunction (pseudonormalization).   2. Right ventricular systolic function  is normal. The right ventricular  size is normal. There is moderately elevated pulmonary artery systolic  pressure.   3. Left atrial size was moderately dilated.   4. Mitral valve mean gradient is 89mHg at heart rate 66bpm. The mitral  valve is degenerative. Mild mitral valve regurgitation. Severe mitral  stenosis.   5. The aortic valve is normal in structure. Aortic valve regurgitation is  not visualized.     6. The inferior vena cava is normal in size with <50% respiratory  variability, suggesting right atrial pressure of 8 mmHg   EKG:  EKG is ordered today.  The ekg ordered today demonstrates sinus rhythm 60 bpm with no acute ST/T wave changes.  Recent Labs: 11/28/2018: Magnesium 1.9 03/30/2019: ALT 22 09/05/2019: BUN 23; Creatinine, Ser 1.71; Hemoglobin 10.2; Platelets 323; Potassium 3.4; Sodium 137  Recent Lipid Panel    Component Value Date/Time   CHOL 171 09/19/2018 0932   CHOL 170 04/07/2018 0910   TRIG 186 (H) 09/19/2018 0932   HDL 33 (L) 09/19/2018 0932   HDL 34 (L) 04/07/2018 0910   CHOLHDL 5.2 09/19/2018 0932   VLDL 37 09/19/2018 0932   LDLCALC 101 (H) 09/19/2018 0932   LDLCALC 109 (H) 04/07/2018 0910    Home Medications   Current Meds  Medication Sig  . acetaminophen (TYLENOL) 500 MG tablet Take 1,000 mg by mouth every 6 (six) hours as needed for moderate pain or fever.  . ALPRAZolam (XANAX) 0.5 MG tablet Take 1 tablet (0.5 mg total) by mouth at bedtime as needed for anxiety.  .Marland Kitchenatorvastatin (LIPITOR) 80 MG tablet TAKE 1 TABLET BY MOUTH EVERY DAY AT 6 PM  . calcium carbonate (TUMS - DOSED IN MG ELEMENTAL CALCIUM) 500 MG chewable tablet Chew 1 tablet by mouth daily as needed for indigestion or heartburn.  . diltiazem (CARDIZEM CD) 120 MG 24 hr capsule Take 1 capsule (120 mg total) by mouth daily.  . fluconazole (DIFLUCAN) 150 MG tablet Take 1 tablet po once. May repeat dose in 3 days as needed for persistent symptoms.  . fluticasone (FLONASE) 50 MCG/ACT nasal spray Place 1 spray into both nostrils daily as needed for allergies or rhinitis.  . metoprolol succinate (TOPROL-XL) 50 MG 24 hr tablet Take 50-100 mg by mouth See admin instructions. Take 50 mg in the morning and 100 mg at night  . omeprazole (PRILOSEC) 40 MG capsule Take 1 capsule (40 mg total) by mouth daily.  . potassium chloride (KLOR-CON) 10 MEQ tablet Take 20-30 mEq by mouth See admin instructions. Take 30 meq  in the morning and 20 meq at night  . torsemide (DEMADEX) 20 MG tablet Take 2 tablets (40 mg total) by mouth 2 (two) times daily.  .Marland Kitchenwarfarin (COUMADIN) 2.5 MG tablet TAKE AS DIRECTED BY ANTI-COAG CLINIC    Review of Systems      Review of Systems  Constitutional: Positive for malaise/fatigue. Negative for chills and fever.  Cardiovascular: Positive for dyspnea on exertion. Negative for chest pain, leg swelling, near-syncope, orthopnea, palpitations and syncope.  Respiratory: Negative for cough, shortness of breath and wheezing.   Gastrointestinal: Negative for nausea and vomiting.  Neurological: Negative for dizziness, light-headedness and weakness.   All other systems reviewed and are otherwise negative except as noted above.  Physical Exam    VS:  BP 120/62 (BP Location: Left Arm, Patient Position: Sitting, Cuff Size: Normal)   Pulse 60   Ht '5\' 4"'  (1.626 m)   Wt 236 lb 6.4 oz (  107.2 kg)   SpO2 97%   BMI 40.58 kg/m  , BMI Body mass index is 40.58 kg/m. GEN: Well nourished, well developed, in no acute distress. HEENT: normal. Neck: Supple, no JVD, carotid bruits, or masses. Cardiac: RRR, no murmurs, rubs, or gallops. No clubbing, cyanosis, edema.  Radials/DP/PT 2+ and equal bilaterally.  Respiratory:  Respirations regular and unlabored, clear to auscultation bilaterally. GI: Soft, nontender, nondistended, BS + x 4. MS: No deformity or atrophy. Skin: Warm and dry, no rash. Neuro:  Strength and sensation are intact. Psych: Normal affect.  Assessment & Plan    1. Severe MR/HFpEf - Noted by echo 06/27/19 with RHC elevated right heart pressures and moderate PAH. Following with Dr. Roxy Manns for workup for MAZE and MVR. Dental exam and CT chest/abd/pelvis next week. Follow up with Dr. Clydene Laming 10/08/19. Euvolemic and well compensated. Continue Toprol, Torsemide.   She had to cancel flight to [place], provided letter so she will hopefully be refunded. She still plans to drive to [place].  Encouraged to discuss with Dr. Roxy Manns. Multiple risks associated with travelling.  2. PAF - Maintaining NSR. Reports intermittent palpitations. ZIO mailed 09/05/19 which never arrived, as plans for upcoming MAZE no indicationfor repeat monitoring and will reach out to ZIO rep to cancel request. Continue metoprolol at present dose.   3. Chronic anticoagulation - Denies bleeding complications. Warfarin secondary to PAF and valvular heart disease.   4. CKD - Careful titration of diuretics and antihypertensives.   Disposition: Follow up with Dr. Roxy Manns regarding severe MR. Anticipate follow up with Dr. Rockey Situ post surgical intervention.   Loel Dubonnet, NP 09/29/2019, 2:25 PM

## 2019-09-30 DIAGNOSIS — Z6839 Body mass index (BMI) 39.0-39.9, adult: Secondary | ICD-10-CM | POA: Insufficient documentation

## 2019-10-02 ENCOUNTER — Other Ambulatory Visit: Payer: Self-pay

## 2019-10-02 ENCOUNTER — Encounter (HOSPITAL_COMMUNITY): Payer: Self-pay | Admitting: Dentistry

## 2019-10-02 ENCOUNTER — Ambulatory Visit (HOSPITAL_COMMUNITY): Payer: Self-pay | Admitting: Dentistry

## 2019-10-02 VITALS — BP 146/64 | HR 59 | Temp 98.2°F

## 2019-10-02 DIAGNOSIS — Z01818 Encounter for other preprocedural examination: Secondary | ICD-10-CM

## 2019-10-02 DIAGNOSIS — Z9189 Other specified personal risk factors, not elsewhere classified: Secondary | ICD-10-CM

## 2019-10-02 DIAGNOSIS — K0530 Chronic periodontitis, unspecified: Secondary | ICD-10-CM | POA: Diagnosis not present

## 2019-10-02 DIAGNOSIS — K0889 Other specified disorders of teeth and supporting structures: Secondary | ICD-10-CM

## 2019-10-02 DIAGNOSIS — K06010 Localized gingival recession, unspecified: Secondary | ICD-10-CM

## 2019-10-02 DIAGNOSIS — M2624 Reverse articulation: Secondary | ICD-10-CM

## 2019-10-02 DIAGNOSIS — K029 Dental caries, unspecified: Secondary | ICD-10-CM | POA: Diagnosis not present

## 2019-10-02 DIAGNOSIS — K036 Deposits [accretions] on teeth: Secondary | ICD-10-CM

## 2019-10-02 DIAGNOSIS — K045 Chronic apical periodontitis: Secondary | ICD-10-CM

## 2019-10-02 DIAGNOSIS — M2632 Excessive spacing of fully erupted teeth: Secondary | ICD-10-CM

## 2019-10-02 DIAGNOSIS — K083 Retained dental root: Secondary | ICD-10-CM

## 2019-10-02 DIAGNOSIS — M264 Malocclusion, unspecified: Secondary | ICD-10-CM

## 2019-10-02 DIAGNOSIS — K08409 Partial loss of teeth, unspecified cause, unspecified class: Secondary | ICD-10-CM

## 2019-10-02 DIAGNOSIS — F40232 Fear of other medical care: Secondary | ICD-10-CM

## 2019-10-02 DIAGNOSIS — I050 Rheumatic mitral stenosis: Secondary | ICD-10-CM

## 2019-10-02 DIAGNOSIS — K011 Impacted teeth: Secondary | ICD-10-CM

## 2019-10-02 MED ORDER — AMOXICILLIN 500 MG PO CAPS: ORAL_CAPSULE | ORAL | 1 refills | Status: DC

## 2019-10-02 NOTE — Patient Instructions (Signed)
[facility]    Department of Dental Medicine     DR. Shaianne Nucci      HEART VALVES AND MOUTH CARE:  FACTS:   If you have any infection in your mouth, it can infect your heart valve.  If you heart valve is infected, you will be seriously ill.  Infections in the mouth can be SILENT and do not always cause pain.  Examples of infections in the mouth are gum disease, dental cavities, and abscesses.  Some possible signs of infection are: Bad breath, bleeding gums, or teeth that are sensitive to sweets, hot, and/or cold. There are many other signs as well.  WHAT YOU HAVE TO DO:   Brush your teeth after meals and at bedtime. Spend at least 2 minutes brushing well, especially behind your back teeth and all around your teeth that stand alone. Brush at the gumline also.  Do not go to bed without brushing your teeth and flossing.  If you gums bleed when you brush or floss, do NOT stop brushing or flossing. It usually means that your gums need more attention and better cleaning.   If your Dentist or Dr. Jandel Patriarca gave you a prescription mouthwash to use, make sure to use it as directed. If you run out of the medication, get a refill at the pharmacy.   If you were given any other medications or directions by your Dentist, please follow them. If you did not understand the directions or forget what you were told, please call. We will be happy to refresh her memory.  If you need antibiotics before dental procedures, make sure you take them one hour prior to every dental visit as directed.   Get a dental checkup every 4-6 months in order to keep your mouth healthy, or to find and treat any new infection. You will most likely need your teeth cleaned or gums treated at the same time.  If you are not able to come in for your scheduled appointment, call your Dentist as soon as possible to reschedule.  If you have a problem in between dental visits, call your Dentist.  

## 2019-10-02 NOTE — Progress Notes (Signed)
DENTAL CONSULTATION  Date of Consultation:  10/02/2019 Patient Name:   Leslie [name] Date of Birth:   16-Feb-1965 Medical Record Number: [id number]  COVID 19 SCREENING: The patient does not symptoms concerning for COVID-19 infection (Including fever, chills, cough, or new SHORTNESS OF BREATH).    VITALS: BP (!) 146/64 (BP Location: Right Arm)   Pulse (!) 59   Temp 98.2 F (36.8 C)   CHIEF COMPLAINT: Patient was referred by Dr. Roxy Manns for a dental consultation.  HPI: Leslie [name] is a 54 year old female recently diagnosed with severe mitral stenosis. Patient with anticipated mitral valve replacement with Dr. Roxy Manns. Patient is now seen as part of a medically necessary preheart valve surgery dental protocol examination.  The patient currently denies acute toothaches, swellings, or abscesses. Patient was last seen approximately 8 years ago for a dental cleaning with Dr. Marcia Brash in [place], [place]. Patient had multiple extractions approximately 10 years ago with Dr. Nicola Girt. Patient denies having partial dentures. 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  PROBLEM LIST: Patient Active Problem List   Diagnosis Date Noted  . Mitral valve stenosis     Priority: High  . BMI 39.0-39.9,adult 09/30/2019  . COVID-19 virus infection 09/09/2019  . Unstable angina ([place]) 09/06/2019  . 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 Encounter for general adult medical examination with abnormal findings 02/21/2019  . Oral candidiasis 02/21/2019  . Acute kidney failure, unspecified ([place]) 12/09/2018  . Right knee pain 11/27/2018  . GERD (gastroesophageal reflux disease) 10/25/2018  . CKD (chronic kidney disease), stage III 10/25/2018  . CHF (congestive heart failure) ([place]) 10/25/2018  . Pyelonephritis 10/01/2018  . Absolute anemia 10/01/2018  . Iron deficiency anemia secondary to blood loss (chronic)   . Polyp of descending colon   . Acute on chronic heart failure with preserved  ejection fraction (HFpEF) ([place])   . AKI (acute kidney injury) ([employer])   . Sepsis ([place]) 08/26/2018  . (HFpEF) heart failure with preserved ejection fraction ([place])   . Renal calculus, right 07/03/2018  . Low back pain 06/27/2018  . Bacterial vaginitis 06/27/2018  . Pedal edema 06/27/2018  . Coronary artery disease, non-occlusive 04/10/2018  . Acute on chronic diastolic CHF (congestive heart failure) ([place]) 04/10/2018  . Encounter for other preprocedural examination 03/16/2018  . Community acquired pneumonia 02/16/2018  . Elevated brain natriuretic peptide (BNP) level 02/16/2018  . Abnormal renal function 02/16/2018  . Acute recurrent pansinusitis 01/13/2018  . Candidiasis 01/13/2018  . Anxiety 01/13/2018  . Osteoarthritis of knee 01/09/2018  . Morbid obesity with body mass index (BMI) of 40.0 or higher ([place]) 11/13/2017  . Major depressive disorder 09/22/2017  . Atrial fibrillation ([place]) 09/11/2017  . Generalized abdominal pain 09/08/2017  . Episode of recurrent major depressive disorder ([place]) 09/08/2017  . Hypertension 06/06/2017  . Cough 06/06/2017  . Tobacco dependency 06/06/2017  . Urinary tract infection with hematuria 04/28/2017  . Acute upper respiratory infection 04/28/2017  . Flu-like symptoms 04/28/2017  . Dysuria 04/28/2017  . Vaginal candidiasis 04/28/2017  . Pain of left lower extremity 02/11/2016  . 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 Fibrocystic breast disease 03/23/2013  . Family history of breast cancer 07/20/2012    PMH: Past Medical History:  Diagnosis Date  . (HFpEF) heart failure with preserved ejection fraction ([place])    a. 08/2017 Echo: EF 55-60%.  Grade 2 diastolic dysfunction.  Moderate mitral stenosis.  . Allergy   . Anemia   . Anxiety   .  BRCA negative 03/22/2013  . Bronchitis 02/19/2016   ON LEVAQUIN PO  . Chest tightness   . Cigarette smoker 09/11/2017   8-10 day  . Dyspnea   . GERD (gastroesophageal reflux disease)   .  History of kidney stones   . Hyperlipidemia   . Hypertension   . Interstitial lung disease ([place])    a. CT 2013 b. 02/2018 CXR noted recurrent intersistial changes ILD vs chronic bronchitis  . Moderate mitral stenosis    a.  08/2017 TEE: EF 60 to 65%.  Moderate mitral stenosis.  Mean gradient 14 mmHg.  Valve area 2.59 cm by planimetry, 2.72 cm by pressure half-time.  . Persistent atrial fibrillation ([place])    a. 08/2017 s/p TEE/DCCV; b. CHA2DS2VASc = 2-->warfarin.    PSH: Past Surgical History:  Procedure Laterality Date  . ABDOMINAL HYSTERECTOMY     total  . ABDOMINAL HYSTERECTOMY    . BREAST BIOPSY Bilateral 2012  . BREAST BIOPSY Right 08-07-12   fibroadenomatous changes and columnar cells  . BREAST BIOPSY  02/03/2015   stereo byrnett  . CHOLECYSTECTOMY N/A 02/27/2016   Procedure: LAPAROSCOPIC CHOLECYSTECTOMY;  Surgeon: Christene Lye, MD;  Location: [facility];  Service: General;  Laterality: N/A;  . COLONOSCOPY WITH PROPOFOL N/A 09/26/2018   Procedure: COLONOSCOPY WITH PROPOFOL;  Surgeon: Lucilla Lame, MD;  Location: [facility] ENDOSCOPY;  Service: Endoscopy;  Laterality: N/A;  . CYSTOSCOPY W/ RETROGRADES Right 08/18/2018   Procedure: CYSTOSCOPY WITH RETROGRADE PYELOGRAM;  Surgeon: Billey Co, MD;  Location: [facility];  Service: Urology;  Laterality: Right;  . CYSTOSCOPY/URETEROSCOPY/HOLMIUM LASER/STENT PLACEMENT Right 08/18/2018   Procedure: CYSTOSCOPY/URETEROSCOPY/STENT PLACEMENT;  Surgeon: Billey Co, MD;  Location: [facility];  Service: Urology;  Laterality: Right;  . ESOPHAGOGASTRODUODENOSCOPY (EGD) WITH PROPOFOL N/A 09/26/2018   Procedure: ESOPHAGOGASTRODUODENOSCOPY (EGD) WITH PROPOFOL;  Surgeon: Lucilla Lame, MD;  Location: [facility];  Service: Endoscopy;  Laterality: N/A;  . GIVENS CAPSULE STUDY N/A 11/03/2018   Procedure: GIVENS CAPSULE STUDY;  Surgeon: Lucilla Lame, MD;  Location: [facility] ENDOSCOPY;  Service: Endoscopy;  Laterality: N/A;  . JOINT REPLACEMENT Left    knee   . KNEE ARTHROPLASTY Right 04/09/2019   Procedure: COMPUTER ASSISTED TOTAL KNEE ARTHROPLASTY;  Surgeon: Dereck Leep, MD;  Location: [facility];  Service: Orthopedics;  Laterality: Right;  . KNEE CLOSED REDUCTION Left 04/15/2015   Procedure: CLOSED MANIPULATION KNEE;  Surgeon: Thornton Park, MD;  Location: [facility];  Service: Orthopedics;  Laterality: Left;  . KNEE SURGERY    . RIGHT/LEFT HEART CATH AND CORONARY ANGIOGRAPHY Bilateral 09/06/2019   Procedure: RIGHT/LEFT HEART CATH AND CORONARY ANGIOGRAPHY;  Surgeon: Minna Merritts, MD;  Location: [facility];  Service: Cardiovascular;  Laterality: Bilateral;  . TEE WITHOUT CARDIOVERSION N/A 09/13/2017   Procedure: TRANSESOPHAGEAL ECHOCARDIOGRAM (TEE);  Surgeon: Nelva Bush, MD;  Location: [facility];  Service: Cardiovascular;  Laterality: N/A;  . TOTAL KNEE ARTHROPLASTY Left 12/25/2014   Procedure: TOTAL KNEE ARTHROPLASTY;  Surgeon: Thornton Park, MD;  Location: [facility];  Service: Orthopedics;  Laterality: Left;  . TUBAL LIGATION      ALLERGIES: Allergies  Allergen Reactions  . Morphine Hives and Rash  . Oxycodone Hcl Hives and Itching  . Dilaudid [Hydromorphone Hcl] Itching    MEDICATIONS: Current Outpatient Medications  Medication Sig Dispense Refill  . acetaminophen (TYLENOL) 500 MG tablet Take 1,000 mg by mouth every 6 (six) hours as needed for moderate pain or fever.    . ALPRAZolam (XANAX) 0.5 MG tablet Take  1 tablet (0.5 mg total) by mouth at bedtime as needed for anxiety. 30 tablet 2  . atorvastatin (LIPITOR) 80 MG tablet TAKE 1 TABLET BY MOUTH EVERY DAY AT 6 PM 90 tablet 0  . diltiazem (CARDIZEM CD) 120 MG 24 hr capsule Take 1 capsule (120 mg total) by mouth daily. 90 capsule 3  . fluticasone (FLONASE) 50 MCG/ACT nasal spray Place 1 spray into both nostrils daily as needed for allergies or rhinitis.    . metoprolol succinate (TOPROL-XL) 50 MG 24 hr tablet Take 50-100 mg by mouth See admin instructions. Take 50 mg  in the morning and 100 mg at night    . omeprazole (PRILOSEC) 40 MG capsule Take 1 capsule (40 mg total) by mouth daily. 90 capsule 1  . potassium chloride (KLOR-CON) 10 MEQ tablet Take 20-30 mEq by mouth See admin instructions. Take 30 meq in the morning and 20 meq at night    . torsemide (DEMADEX) 20 MG tablet Take 2 tablets (40 mg total) by mouth 2 (two) times daily. 360 tablet 3  . warfarin (COUMADIN) 2.5 MG tablet TAKE AS DIRECTED BY ANTI-COAG CLINIC 90 tablet 0  . calcium carbonate (TUMS - DOSED IN MG ELEMENTAL CALCIUM) 500 MG chewable tablet Chew 1 tablet by mouth daily as needed for indigestion or heartburn. (Patient not taking: Reported on 10/02/2019)     No current facility-administered medications for this visit.    LABS: Lab Results  Component Value Date   WBC 7.0 09/05/2019   HGB 10.2 (L) 09/05/2019   HCT 31.6 (L) 09/05/2019   MCV 85.9 09/05/2019   PLT 323 09/05/2019      Component Value Date/Time   NA 137 09/05/2019 1638   NA 139 12/25/2018 1033   NA 138 05/08/2014 1310   K 3.4 (L) 09/05/2019 1638   K 3.4 (L) 05/08/2014 1310   CL 97 (L) 09/05/2019 1638   CL 99 (L) 05/08/2014 1310   CO2 27 09/05/2019 1638   CO2 29 05/08/2014 1310   GLUCOSE 115 (H) 09/05/2019 1638   GLUCOSE 100 (H) 05/08/2014 1310   BUN 23 (H) 09/05/2019 1638   BUN 15 12/25/2018 1033   BUN 14 05/08/2014 1310   CREATININE 1.71 (H) 09/05/2019 1638   CREATININE 1.08 (H) 05/08/2014 1310   CALCIUM 9.3 09/05/2019 1638   CALCIUM 9.2 05/08/2014 1310   GFRNONAA 34 (L) 09/05/2019 1638   GFRNONAA >60 05/08/2014 1310   GFRAA 39 (L) 09/05/2019 1638   GFRAA >60 05/08/2014 1310   Lab Results  Component Value Date   INR 2.0 09/26/2019   INR 2.6 08/15/2019   INR 1.5 (H) 08/01/2019   No results found for: PTT  SOCIAL HISTORY: Social History   Socioeconomic History  . Marital status: Married    Spouse name: Not on file  . Number of children: Not on file  . Years of education: Not on file  . Highest  education level: Not on file  Occupational History  . Not on file  Tobacco Use  . Smoking status: Former Smoker    Packs/day: 0.50    Years: 20.00    Pack years: 10.00    Types: Cigarettes    Quit date: 02/20/2018    Years since quitting: 1.6  . Smokeless tobacco: Never Used  Vaping Use  . Vaping Use: Never used  Substance and Sexual Activity  . Alcohol use: Yes    Comment: rarely  . Drug use: No  . Sexual activity: Not  on file  Other Topics Concern  . Not on file  Social History Narrative  . Not on file   Social Determinants of Health   Financial Resource Strain:   . Difficulty of Paying Living Expenses: Not on file  Food Insecurity:   . Worried About Charity fundraiser in the Last Year: Not on file  . Ran Out of Food in the Last Year: Not on file  Transportation Needs:   . Lack of Transportation (Medical): Not on file  . Lack of Transportation (Non-Medical): Not on file  Physical Activity:   . Days of Exercise per Week: Not on file  . Minutes of Exercise per Session: Not on file  Stress:   . Feeling of Stress : Not on file  Social Connections:   . Frequency of Communication with Friends and Family: Not on file  . Frequency of Social Gatherings with Friends and Family: Not on file  . Attends Religious Services: Not on file  . Active Member of Clubs or Organizations: Not on file  . Attends Archivist Meetings: Not on file  . Marital Status: Not on file  Intimate Partner Violence:   . Fear of Current or Ex-Partner: Not on file  . Emotionally Abused: Not on file  . Physically Abused: Not on file  . Sexually Abused: Not on file    FAMILY HISTORY: Family History  Problem Relation Age of Onset  . Cancer Mother 86       breast  . Hypertension Mother   . Cancer Maternal Aunt        breast  . Cancer Maternal Grandmother        breast  . Osteoarthritis Father   . Hypertension Father     REVIEW OF SYSTEMS: Reviewed with the patient as per History of  present illness. Psych: Patient has history of anxiety and dental phobia.   DENTAL HISTORY: CHIEF COMPLAINT: Patient was referred by Dr. Roxy Manns for a dental consultation.  HPI: Leslie [name] is a 54 year old female recently diagnosed with severe mitral stenosis. Patient with anticipated mitral valve replacement with Dr. Roxy Manns. Patient is now seen as part of a medically necessary preheart valve surgery dental protocol examination.  The patient currently denies acute toothaches, swellings, or abscesses. Patient was last seen approximately 8 years ago for a dental cleaning with Dr. Marcia Brash in [place], [place]. Patient had multiple extractions approximately 10 years ago with Dr. Nicola Girt. Patient denies having partial dentures. 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  DENTAL EXAMINATION: GENERAL: The patient is well-developed, well-nourished female in no acute distress. HEAD AND NECK: There is no palpable neck lymphadenopathy. The patient denies acute TMJ symptoms. INTRAORAL EXAM: The patient has normal saliva. There is no evidence of oral abscess formation. DENTITION: Patient is missing tooth numbers 1, 16, 17, 19, 30, and 32. There are retained root segments numbers 4 and 5. Tooth #11 is impacted. PERIODONTAL: Patient has chronic periodontitis with plaque and calculus accumulations, gingival recession, and tooth mobility as per dental charting form. Radiographic calculus is noted. DENTAL CARIES/SUBOPTIMAL RESTORATIONS: Multiple dental caries are noted as per dental charting form. ENDODONTIC: Patient currently denies acute pulpitis symptoms. Patient does have periapical pathology associated with apices of tooth numbers 4, and 5. CROWN AND BRIDGE: There are no crown restorations noted. PROSTHODONTIC: Patient denies having partial dentures. OCCLUSION: Patient has a poor occlusal scheme secondary to multiple missing teeth, multiple retained root segments, multiple diastemas, anterior  open bite, and bilateral  anterior and posterior crossbite.  RADIOGRAPHIC INTERPRETATION: An orthopantogram was taken and supplemented with a full series of dental radiographs. There are multiple missing teeth. There are multiple retained root segments. Multiple dental caries are noted. Multiple diastemas are noted. There is supra eruption and drifting of the unopposed teeth into the edentulous areas. There is periapical pathology and radiolucency associated with apices of tooth #4, and 5. There is moderate bone loss noted. Radiographic calculus is noted.  ASSESSMENTS: 1. Severe mitral stenosis 2. Preheart valve surgery dental protocol 3. Chronic apical periodontitis 4. Retained root segments 5. Dental caries 6. Chronic periodontitis with bone loss 7. Gingival recession 8. Accretions 9. Tooth mobility 10. Multiple missing teeth 11. Multiple diastemas 12. Anterior open bite 13. Bilateral anterior and posterior crossbite 14. Impacted tooth #11 15. Poor occlusal scheme and malocclusion 16. Risk of bleeding with invasive dental procedures due to current Coumadin therapy 17. Need for antibiotic premedication prior to invasive dental procedures due to the anticipated heart valve surgery and history of total knee replacements. 18. Dental phobia  PLAN/RECOMMENDATIONS: 1. I discussed the risks, benefits, and complications of various treatment options with the patient in relationship to her medical and dental conditions, anticipated heart valve surgery, and risk for endocarditis. We discussed various treatment options to include no treatment, multiple extractions with alveoloplasty, pre-prosthetic surgery as indicated, periodontal therapy, dental restorations, root canal therapy, crown and bridge therapy, implant therapy, and replacement of missing teeth as indicated. We also discussed referral to an oral surgeon for dental extraction procedures. The patient currently wishes to proceed with referral  to Dr. Frederik Schmidt for extraction of tooth numbers 2, 4, 5, and 18 with alveoloplasty, IV sedation, and antibiotic premedication as indicated. Patient has been scheduled for consultation for 10/15/2019 at 10:15 AM. Patient understands that cardiology team will need to be consulted concerning Lovenox bridging after discontinuation of Coumadin therapy for the dental extractions. Patient also understands the need for follow-up dental care with primary dentist for periodontal therapy, dental restorations, and evaluation for replacement missing teeth as indicated after adequate healing and once medically stable from the anticipated heart valve surgery. Patient understands that she will need antibiotic premedication prior to invasive dental procedures. A prescription for amoxicillin will be sent to the [facility] in [place], [place].  2. Discussion of findings with medical team and coordination of future medical and dental care as needed.  I spent in excess of  150 minutes during the conduct of this consultation and >50% of this time involved direct face-to-face encounter for counseling and/or coordination of the patient's care.    Lenn Cal, DDS

## 2019-10-03 ENCOUNTER — Ambulatory Visit: Payer: Managed Care, Other (non HMO) | Admitting: Family

## 2019-10-04 ENCOUNTER — Ambulatory Visit
Admission: RE | Admit: 2019-10-04 | Discharge: 2019-10-04 | Disposition: A | Discharge status: Home / Self Care | Payer: Managed Care, Other (non HMO) | Source: Ambulatory Visit | Attending: Thoracic Surgery (Cardiothoracic Vascular Surgery) | Admitting: Thoracic Surgery (Cardiothoracic Vascular Surgery)

## 2019-10-04 DIAGNOSIS — I050 Rheumatic mitral stenosis: Secondary | ICD-10-CM

## 2019-10-04 MED ORDER — IOPAMIDOL (ISOVUE-370) INJECTION 76%
50.0000 mL | Freq: Once | INTRAVENOUS | Status: AC | PRN
  Administered 2019-10-04: 50 mL via INTRAVENOUS

## 2019-10-07 NOTE — Progress Notes (Signed)
Can we assist this patient with lovenox bridge Would likely need to stop warfarin on 9/22 in preparation for 9/27 procedure Would bridge with lovenox before and after Thx TGollan

## 2019-10-08 ENCOUNTER — Ambulatory Visit: Payer: Managed Care, Other (non HMO) | Admitting: Thoracic Surgery (Cardiothoracic Vascular Surgery)

## 2019-10-08 ENCOUNTER — Telehealth: Payer: Self-pay

## 2019-10-08 NOTE — Telephone Encounter (Signed)
-----   Message from Minna Merritts, MD sent at 10/07/2019 10:51 AM EDT -----   ----- Message ----- From: Lenn Cal, DDS Sent: 10/03/2019   1:47 PM EDT To: Laury Deep, RN, Rexene Alberts, MD, #  Patient has been referred to Dr. Frederik Schmidt, oral surgeon, for evaluation for extraction of tooth numbers 2, 4, 5, and 18.  Patient is currently scheduled for oral surgery consult on 10/15/2019.  Patient will then be scheduled as indicated. Dr. Roxy Manns indicated that patient needed open in therapy to be discontinued and patient was to have Lovenox bridging.  Please let me know who will handle the Lovenox bridging to allow for dental extraction procedures. I will have Dr. Raynelle Dick office contact you all for that procedure.   Lenn Cal, DDS

## 2019-10-08 NOTE — Telephone Encounter (Signed)
Spoke w/ pt.  Offered her appt this afternoon to come in for Lovenox bridge instructions, but she states that she is scheduled for a consultation w/ DDS on 10/15/19, not the actual procedure. She will contact me as soon as she has a date for this and will schedule accordingly. Pt has never done Lovenox bridging before, so she will need education. Advised her that we will need at least 5 days notice for her procedure for this. She verbalizes understanding and will call me after her appt on 10/15/19.   Author: Minna Merritts, MD Service: Cardiology Author Type: Physician  Filed: 10/07/2019 10:57 AM Encounter Date: 10/02/2019 Status: Signed  Editor: Minna Merritts, MD (Physician)     Can we assist this patient with lovenox bridge Would likely need to stop warfarin on 9/22 in preparation for 9/27 procedure Would bridge with lovenox before and after Thx TGollan

## 2019-10-11 ENCOUNTER — Other Ambulatory Visit: Payer: Managed Care, Other (non HMO)

## 2019-10-17 NOTE — Telephone Encounter (Signed)
Spoke w/ pt.  She reports that her consultation w/ oral surgeon on 10/15/19 went very well, however, he is not in her insurange network and the procedure would cost her $1500 out of pocket.   She reports that he advised her that she will not need Lovenox bridging, as the bleeding will be minimal and he will do surgery on warfarin. She has not yet scheduled the procedure, secondary to financial issues, but does wish to do so. She requests that I send her last INR reading of 2.0 to his office, but is not at home to provide me w/ his fax #. She is taking her grandchild to school at the moment and will call back or send a MyChart message as soon as she gets home.

## 2019-10-22 ENCOUNTER — Other Ambulatory Visit: Payer: Self-pay | Admitting: Cardiovascular Disease

## 2019-10-24 ENCOUNTER — Ambulatory Visit (INDEPENDENT_AMBULATORY_CARE_PROVIDER_SITE_OTHER): Payer: Managed Care, Other (non HMO) | Admitting: Internal Medicine

## 2019-10-24 ENCOUNTER — Encounter: Payer: Self-pay | Admitting: Internal Medicine

## 2019-10-24 VITALS — Ht 64.0 in | Wt 233.0 lb

## 2019-10-24 DIAGNOSIS — J45909 Unspecified asthma, uncomplicated: Secondary | ICD-10-CM | POA: Diagnosis not present

## 2019-10-24 DIAGNOSIS — J209 Acute bronchitis, unspecified: Secondary | ICD-10-CM | POA: Diagnosis not present

## 2019-10-24 MED ORDER — AZITHROMYCIN 250 MG PO TABS: ORAL_TABLET | ORAL | 0 refills | Status: DC

## 2019-10-24 NOTE — Progress Notes (Signed)
[facility] [place], [place] [address]  Internal MEDICINE  Telephone Visit  Patient Name: Leslie [name]  [id number]  [id number]  Date of Service: 10/27/2019  I connected with the patient at 1100 by telephone and verified the patients identity using two identifiers.   I discussed the limitations, risks, security and privacy concerns of performing an evaluation and management service by telephone and the availability of in person appointments. I also discussed with the patient that there may be a patient responsible charge related to the service.  The patient expressed understanding and agrees to proceed.    Chief Complaint  Patient presents with  . Telephone Assessment    [phone]  . Telephone Screen  . Chest Pain  . Sinusitis  . Cough  . Back Pain    HPI Pt is connected with C/O cough and sinus congestion. Denies any fever or chills  Slight chest pain with cough    Current Medication: Outpatient Encounter Medications as of 10/24/2019  Medication Sig  . acetaminophen (TYLENOL) 500 MG tablet Take 1,000 mg by mouth every 6 (six) hours as needed for moderate pain or fever.  . ALPRAZolam (XANAX) 0.5 MG tablet Take 1 tablet (0.5 mg total) by mouth at bedtime as needed for anxiety.  Marland Kitchen atorvastatin (LIPITOR) 80 MG tablet TAKE 1 TABLET BY MOUTH EVERY DAY AT 6 PM  . calcium carbonate (TUMS - DOSED IN MG ELEMENTAL CALCIUM) 500 MG chewable tablet Chew 1 tablet by mouth daily as needed for indigestion or heartburn.   . diltiazem (CARDIZEM CD) 120 MG 24 hr capsule TAKE 1 CAPSULE(120 MG) BY MOUTH DAILY  . fluticasone (FLONASE) 50 MCG/ACT nasal spray Place 1 spray into both nostrils daily as needed for allergies or rhinitis.  . metoprolol succinate (TOPROL-XL) 50 MG 24 hr tablet Take 50-100 mg by mouth See admin instructions. Take 50 mg in the morning and 100 mg at night  . omeprazole (PRILOSEC) 40 MG capsule Take 1 capsule (40 mg total) by mouth daily.  .  potassium chloride (KLOR-CON) 10 MEQ tablet Take 20-30 mEq by mouth See admin instructions. Take 30 meq in the morning and 20 meq at night  . torsemide (DEMADEX) 20 MG tablet Take 2 tablets (40 mg total) by mouth 2 (two) times daily.  Marland Kitchen warfarin (COUMADIN) 2.5 MG tablet TAKE AS DIRECTED BY ANTI-COAG CLINIC  . [DISCONTINUED] amoxicillin (AMOXIL) 500 MG capsule Take four capsules one hour before dental appointment.  Marland Kitchen azithromycin (ZITHROMAX) 250 MG tablet Use as directed   No facility-administered encounter medications on file as of 10/24/2019.    Surgical History: Past Surgical History:  Procedure Laterality Date  . ABDOMINAL HYSTERECTOMY     total  . ABDOMINAL HYSTERECTOMY    . BREAST BIOPSY Bilateral 2012  . BREAST BIOPSY Right 08-07-12   fibroadenomatous changes and columnar cells  . BREAST BIOPSY  02/03/2015   stereo byrnett  . CHOLECYSTECTOMY N/A 02/27/2016   Procedure: LAPAROSCOPIC CHOLECYSTECTOMY;  Surgeon: Christene Lye, MD;  Location: [facility] ORS;  Service: General;  Laterality: N/A;  . COLONOSCOPY WITH PROPOFOL N/A 09/26/2018   Procedure: COLONOSCOPY WITH PROPOFOL;  Surgeon: Lucilla Lame, MD;  Location: [facility] ENDOSCOPY;  Service: Endoscopy;  Laterality: N/A;  . CYSTOSCOPY W/ RETROGRADES Right 08/18/2018   Procedure: CYSTOSCOPY WITH RETROGRADE PYELOGRAM;  Surgeon: Billey Co, MD;  Location: [facility];  Service: Urology;  Laterality: Right;  . CYSTOSCOPY/URETEROSCOPY/HOLMIUM LASER/STENT PLACEMENT Right 08/18/2018   Procedure: CYSTOSCOPY/URETEROSCOPY/STENT PLACEMENT;  Surgeon: Nickolas Madrid  C, MD;  Location: [facility];  Service: Urology;  Laterality: Right;  . ESOPHAGOGASTRODUODENOSCOPY (EGD) WITH PROPOFOL N/A 09/26/2018   Procedure: ESOPHAGOGASTRODUODENOSCOPY (EGD) WITH PROPOFOL;  Surgeon: Lucilla Lame, MD;  Location: [facility] END[facility];  Service: Endoscopy;  Laterality: N/A;  . GIVENS CAPSULE STUDY N/A 11/03/2018   Procedure: GIVENS CAPSULE STUDY;  Surgeon: Lucilla Lame, MD;   Location: [facility] ENDOSCOPY;  Service: Endoscopy;  Laterality: N/A;  . JOINT REPLACEMENT Left    knee  . KNEE ARTHROPLASTY Right 04/09/2019   Procedure: COMPUTER ASSISTED TOTAL KNEE ARTHROPLASTY;  Surgeon: Dereck Leep, MD;  Location: [facility];  Service: Orthopedics;  Laterality: Right;  . KNEE CLOSED REDUCTION Left 04/15/2015   Procedure: CLOSED MANIPULATION KNEE;  Surgeon: Thornton Park, MD;  Location: [facility];  Service: Orthopedics;  Laterality: Left;  . KNEE SURGERY    . RIGHT/LEFT HEART CATH AND CORONARY ANGIOGRAPHY Bilateral 09/06/2019   Procedure: RIGHT/LEFT HEART CATH AND CORONARY ANGIOGRAPHY;  Surgeon: Minna Merritts, MD;  Location: [facility];  Service: Cardiovascular;  Laterality: Bilateral;  . TEE WITHOUT CARDIOVERSION N/A 09/13/2017   Procedure: TRANSESOPHAGEAL ECHOCARDIOGRAM (TEE);  Surgeon: Nelva Bush, MD;  Location: [facility];  Service: Cardiovascular;  Laterality: N/A;  . TOTAL KNEE ARTHROPLASTY Left 12/25/2014   Procedure: TOTAL KNEE ARTHROPLASTY;  Surgeon: Thornton Park, MD;  Location: [facility];  Service: Orthopedics;  Laterality: Left;  . TUBAL LIGATION      Medical History: Past Medical History:  Diagnosis Date  . (HFpEF) heart failure with preserved ejection fraction ([place])    a. 08/2017 Echo: EF 55-60%.  Grade 2 diastolic dysfunction.  Moderate mitral stenosis.  . Allergy   . Anemia   . Anxiety   . BRCA negative 03/22/2013  . Bronchitis 02/19/2016   ON LEVAQUIN PO  . Chest tightness   . Cigarette smoker 09/11/2017   8-10 day  . Dyspnea   . GERD (gastroesophageal reflux disease)   . History of kidney stones   . Hyperlipidemia   . Hypertension   . Interstitial lung disease ([place])    a. CT 2013 b. 02/2018 CXR noted recurrent intersistial changes ILD vs chronic bronchitis  . Moderate mitral stenosis    a.  08/2017 TEE: EF 60 to 65%.  Moderate mitral stenosis.  Mean gradient 14 mmHg.  Valve area 2.59 cm by planimetry, 2.72 cm by pressure  half-time.  . Persistent atrial fibrillation ([place])    a. 08/2017 s/p TEE/DCCV; b. CHA2DS2VASc = 2-->warfarin.    Family History: Family History  Problem Relation Age of Onset  . Cancer Mother 58       breast  . Hypertension Mother   . Cancer Maternal Aunt        breast  . Cancer Maternal Grandmother        breast  . Osteoarthritis Father   . Hypertension Father     Social History   Socioeconomic History  . Marital status: Married    Spouse name: Not on file  . Number of children: Not on file  . Years of education: Not on file  . Highest education level: Not on file  Occupational History  . Not on file  Tobacco Use  . Smoking status: Former Smoker    Packs/day: 0.50    Years: 20.00    Pack years: 10.00    Types: Cigarettes    Quit date: 02/20/2018    Years since quitting: 1.6  . Smokeless tobacco: Never Used  Vaping Use  . Vaping  Use: Never used  Substance and Sexual Activity  . Alcohol use: Yes    Comment: rarely  . Drug use: No  . Sexual activity: Not on file  Other Topics Concern  . Not on file  Social History Narrative  . Not on file   Social Determinants of Health   Financial Resource Strain:   . Difficulty of Paying Living Expenses: Not on file  Food Insecurity:   . Worried About Charity fundraiser in the Last Year: Not on file  . Ran Out of Food in the Last Year: Not on file  Transportation Needs:   . Lack of Transportation (Medical): Not on file  . Lack of Transportation (Non-Medical): Not on file  Physical Activity:   . Days of Exercise per Week: Not on file  . Minutes of Exercise per Session: Not on file  Stress:   . Feeling of Stress : Not on file  Social Connections:   . Frequency of Communication with Friends and Family: Not on file  . Frequency of Social Gatherings with Friends and Family: Not on file  . Attends Religious Services: Not on file  . Active Member of Clubs or Organizations: Not on file  . Attends Archivist  Meetings: Not on file  . Marital Status: Not on file  Intimate Partner Violence:   . Fear of Current or Ex-Partner: Not on file  . Emotionally Abused: Not on file  . Physically Abused: Not on file  . Sexually Abused: Not on file   Review of Systems  Constitutional: Negative.   HENT: Positive for postnasal drip and sinus pain.   Respiratory: Positive for cough and chest tightness.   Cardiovascular: Negative for chest pain and leg swelling.  Allergic/Immunologic: Positive for environmental allergies.  Neurological: Negative.     Vital Signs: Ht _0  (1.626 m)   Wt 233 lb (105.7 kg)   BMI 39.99 kg/m    Observation/Objective: Sounds congested, NAD    Assessment/Plan: 1. Acute bronchitis with asthma Continue all inhalers as before  - azithromycin (ZITHROMAX) 250 MG tablet; Use as directed  Dispense: 6 tablet; Refill: 0  General Counseling: Nanami verbalizes understanding of the findings of today's phone visit and agrees with plan of treatment. I have discussed any further diagnostic evaluation that may be needed or ordered today. We also reviewed her medications today. she has been encouraged to call the office with any questions or concerns that should arise related to todays visit.   Meds ordered this encounter  Medications  . azithromycin (ZITHROMAX) 250 MG tablet    Sig: Use as directed    Dispense:  6 tablet    Refill:  0    Time spent:15 Minutes    Dr Lavera Guise Internal medicine

## 2019-10-29 ENCOUNTER — Ambulatory Visit: Payer: Managed Care, Other (non HMO) | Admitting: Thoracic Surgery (Cardiothoracic Vascular Surgery)

## 2019-11-07 ENCOUNTER — Ambulatory Visit (INDEPENDENT_AMBULATORY_CARE_PROVIDER_SITE_OTHER): Payer: Managed Care, Other (non HMO)

## 2019-11-07 ENCOUNTER — Other Ambulatory Visit: Payer: Self-pay

## 2019-11-07 DIAGNOSIS — I050 Rheumatic mitral stenosis: Secondary | ICD-10-CM | POA: Diagnosis not present

## 2019-11-07 DIAGNOSIS — I4891 Unspecified atrial fibrillation: Secondary | ICD-10-CM

## 2019-11-07 DIAGNOSIS — Z5181 Encounter for therapeutic drug level monitoring: Secondary | ICD-10-CM

## 2019-11-07 LAB — POCT INR: INR: 3.1 — AB (ref 2.0–3.0)

## 2019-11-07 NOTE — Patient Instructions (Signed)
-   have a serving of greens today, then  - continue warfarin dosage of 1.25 mg (half the 2.5 mg tablet) every day.   - recheck INR in 6 weeks

## 2019-11-12 ENCOUNTER — Encounter: Payer: Self-pay | Admitting: Nurse Practitioner

## 2019-11-12 ENCOUNTER — Ambulatory Visit: Payer: Managed Care, Other (non HMO) | Admitting: Nurse Practitioner

## 2019-11-12 ENCOUNTER — Other Ambulatory Visit: Payer: Self-pay

## 2019-11-12 VITALS — BP 138/68 | HR 82 | Temp 97.2°F | Resp 16 | Ht 64.0 in | Wt 236.0 lb

## 2019-11-12 DIAGNOSIS — N760 Acute vaginitis: Secondary | ICD-10-CM | POA: Diagnosis not present

## 2019-11-12 DIAGNOSIS — B3731 Acute candidiasis of vulva and vagina: Secondary | ICD-10-CM

## 2019-11-12 DIAGNOSIS — R319 Hematuria, unspecified: Secondary | ICD-10-CM

## 2019-11-12 DIAGNOSIS — N390 Urinary tract infection, site not specified: Secondary | ICD-10-CM

## 2019-11-12 DIAGNOSIS — B9689 Other specified bacterial agents as the cause of diseases classified elsewhere: Secondary | ICD-10-CM

## 2019-11-12 DIAGNOSIS — B373 Candidiasis of vulva and vagina: Secondary | ICD-10-CM | POA: Diagnosis not present

## 2019-11-12 DIAGNOSIS — I4891 Unspecified atrial fibrillation: Secondary | ICD-10-CM

## 2019-11-12 DIAGNOSIS — R300 Dysuria: Secondary | ICD-10-CM

## 2019-11-12 LAB — POCT URINALYSIS DIPSTICK
Bilirubin, UA: NEGATIVE
Glucose, UA: NEGATIVE
Leukocytes, UA: NEGATIVE
Nitrite, UA: NEGATIVE
Protein, UA: NEGATIVE
Spec Grav, UA: 1.015 (ref 1.010–1.025)
Urobilinogen, UA: 0.2 E.U./dL
pH, UA: 7 (ref 5.0–8.0)

## 2019-11-12 MED ORDER — FLUCONAZOLE 150 MG PO TABS: ORAL_TABLET | ORAL | 0 refills | Status: DC

## 2019-11-12 MED ORDER — SULFAMETHOXAZOLE-TRIMETHOPRIM 800-160 MG PO TABS: 1.0000 | ORAL_TABLET | Freq: Two times a day (BID) | ORAL | 0 refills | Status: DC

## 2019-11-12 MED ORDER — METRONIDAZOLE 0.75 % VA GEL: 1.0000 | Freq: Every day | VAGINAL | 0 refills | Status: DC

## 2019-11-12 NOTE — Progress Notes (Signed)
[facility] [place], [place] [address]  Internal MEDICINE  Office Visit Note  Patient Name: Leslie [name]  [id number]  [id number]  Date of Service: 12/01/2019   Pt is here for a sick visit.   Chief Complaint  Patient presents with  . Acute Visit  . Urinary Tract Infection    possible  . controlled substance form    reviewed with PT     The patient is here for sick visit. She is having right lower back pain, same area she was having pain when she had significant renal calculus. She states that she is having some burning with urination and a strong odor to the urine at times. Has noted some vaginal discharge as well. She believes that odor may be coming from vaginal discharge. There is a trace of blood and trace of protein in her urine sample today.        Current Medication:  Outpatient Encounter Medications as of 11/12/2019  Medication Sig  . acetaminophen (TYLENOL) 500 MG tablet Take 1,000 mg by mouth every 6 (six) hours as needed for moderate pain or fever.  . ALPRAZolam (XANAX) 0.5 MG tablet Take 1 tablet (0.5 mg total) by mouth at bedtime as needed for anxiety.  Marland Kitchen atorvastatin (LIPITOR) 80 MG tablet TAKE 1 TABLET BY MOUTH EVERY DAY AT 6 PM  . calcium carbonate (TUMS - DOSED IN MG ELEMENTAL CALCIUM) 500 MG chewable tablet Chew 1 tablet by mouth daily as needed for indigestion or heartburn.   . diltiazem (CARDIZEM CD) 120 MG 24 hr capsule TAKE 1 CAPSULE(120 MG) BY MOUTH DAILY  . fluticasone (FLONASE) 50 MCG/ACT nasal spray Place 1 spray into both nostrils daily as needed for allergies or rhinitis.  . metoprolol succinate (TOPROL-XL) 50 MG 24 hr tablet Take 50-100 mg by mouth See admin instructions. Take 50 mg in the morning and 100 mg at night  . omeprazole (PRILOSEC) 40 MG capsule Take 1 capsule (40 mg total) by mouth daily.  . potassium chloride (KLOR-CON) 10 MEQ tablet Take 20-30 mEq by mouth See admin instructions. Take 30 meq in the morning  and 20 meq at night  . torsemide (DEMADEX) 20 MG tablet Take 2 tablets (40 mg total) by mouth 2 (two) times daily.  Marland Kitchen warfarin (COUMADIN) 2.5 MG tablet TAKE AS DIRECTED BY ANTI-COAG CLINIC  . fluconazole (DIFLUCAN) 150 MG tablet Take 1 tablet po once. May repeat dose in 3 days as needed for persistent symptoms.  . metroNIDAZOLE (METROGEL) 0.75 % vaginal gel Place 1 Applicatorful vaginally at bedtime.  . sulfamethoxazole-trimethoprim (BACTRIM DS) 800-160 MG tablet Take 1 tablet by mouth 2 (two) times daily.  . [DISCONTINUED] azithromycin (ZITHROMAX) 250 MG tablet Use as directed   No facility-administered encounter medications on file as of 11/12/2019.      Medical History: Past Medical History:  Diagnosis Date  . (HFpEF) heart failure with preserved ejection fraction ([place])    a. 08/2017 Echo: EF 55-60%.  Grade 2 diastolic dysfunction.  Moderate mitral stenosis.  . Allergy   . Anemia   . Anxiety   . BRCA negative 03/22/2013  . Bronchitis 02/19/2016   ON LEVAQUIN PO  . Chest tightness   . Cigarette smoker 09/11/2017   8-10 day  . Dyspnea   . GERD (gastroesophageal reflux disease)   . History of kidney stones   . Hyperlipidemia   . Hypertension   . Interstitial lung disease ([place])    a. CT 2013 b. 02/2018  CXR noted recurrent intersistial changes ILD vs chronic bronchitis  . Moderate mitral stenosis    a.  08/2017 TEE: EF 60 to 65%.  Moderate mitral stenosis.  Mean gradient 14 mmHg.  Valve area 2.59 cm by planimetry, 2.72 cm by pressure half-time.  . Persistent atrial fibrillation ([place])    a. 08/2017 s/p TEE/DCCV; b. CHA2DS2VASc = 2-->warfarin.     Today's Vitals   11/12/19 1013  BP: 138/68  Pulse: 82  Resp: 16  Temp: (!) 97.2 F (36.2 C)  SpO2: 99%  Weight: 236 lb (107 kg)  Height: _0  (1.626 m)   Body mass index is 40.51 kg/m.  Review of Systems  Constitutional: Negative for activity change, chills, fatigue and unexpected weight change.  HENT: Negative for  congestion, postnasal drip, rhinorrhea, sneezing and sore throat.   Eyes: Negative for redness.  Respiratory: Negative for cough, chest tightness and shortness of breath.   Cardiovascular: Negative for chest pain and palpitations.       Chronic a-fib  Gastrointestinal: Negative for abdominal pain, constipation, diarrhea, nausea and vomiting.  Genitourinary: Positive for dysuria, flank pain and vaginal discharge. Negative for frequency.  Musculoskeletal: Positive for back pain. Negative for arthralgias, joint swelling and neck pain.  Skin: Negative for rash.  Allergic/Immunologic: Negative for environmental allergies.  Neurological: Negative for dizziness, tremors, numbness and headaches.  Hematological: Negative for adenopathy. Does not bruise/bleed easily.  Psychiatric/Behavioral: Negative for behavioral problems (Depression), sleep disturbance and suicidal ideas. The patient is not nervous/anxious.     Physical Exam Vitals and nursing note reviewed.  Constitutional:      General: She is not in acute distress.    Appearance: Normal appearance. She is well-developed. She is obese. She is ill-appearing. She is not diaphoretic.  HENT:     Head: Normocephalic and atraumatic.     Mouth/Throat:     Pharynx: No oropharyngeal exudate.  Eyes:     Pupils: Pupils are equal, round, and reactive to light.  Neck:     Thyroid: No thyromegaly.     Vascular: No carotid bruit or JVD.     Trachea: No tracheal deviation.  Cardiovascular:     Rate and Rhythm: Normal rate. Rhythm irregular.     Heart sounds: Normal heart sounds. No murmur heard.  No friction rub. No gallop.   Pulmonary:     Effort: Pulmonary effort is normal. No respiratory distress.     Breath sounds: Normal breath sounds. No wheezing or rales.  Chest:     Chest wall: No tenderness.  Abdominal:     General: Bowel sounds are normal.     Palpations: Abdomen is soft.     Tenderness: There is no abdominal tenderness.   Genitourinary:    Comments: Urine sample shows trace blood and trace protein. Musculoskeletal:        General: Normal range of motion.     Cervical back: Normal range of motion and neck supple.     Comments: Right lower back pain.   Lymphadenopathy:     Cervical: No cervical adenopathy.  Skin:    General: Skin is warm and dry.  Neurological:     Mental Status: She is alert and oriented to person, place, and time.     Cranial Nerves: No cranial nerve deficit.  Psychiatric:        Mood and Affect: Mood normal.        Behavior: Behavior normal.        Thought Content: Thought  content normal.        Judgment: Judgment normal.   Assessment/Plan: 1. Urinary tract infection with hematuria, site unspecified Start bactrim DS twice daily for 10 days. Send urine sample for culture and sensitivity and adjust antibiotics as indicated.  - sulfamethoxazole-trimethoprim (BACTRIM DS) 800-160 MG tablet; Take 1 tablet by mouth 2 (two) times daily.  Dispense: 20 tablet; Refill: 0  2. Bacterial vaginitis Use metronidazole vaginal gel. Use nightly for next 7 nights.  - metroNIDAZOLE (METROGEL) 0.75 % vaginal gel; Place 1 Applicatorful vaginally at bedtime.  Dispense: 70 g; Refill: 0  3. Vaginal candidiasis Take diflucan 172m once.  Take once. Repeat dose in three days for persistent symptoms.  - fluconazole (DIFLUCAN) 150 MG tablet; Take 1 tablet po once. May repeat dose in 3 days as needed for persistent symptoms.  Dispense: 3 tablet; Refill: 0  4. Atrial fibrillation, unspecified type ([facility] Continue regular visits with cardiology   5. Dysuria - POCT Urinalysis Dipstick - CULTURE, URINE COMPREHENSIVE  General Counseling: Salsabeel verbalizes understanding of the findings of todays visit and agrees with plan of treatment. I have discussed any further diagnostic evaluation that may be needed or ordered today. We also reviewed her medications today. she has been encouraged to call the office with any  questions or concerns that should arise related to todays visit.    Counseling:  This patient was seen by HLeretha PolFNP Collaboration with Dr FLavera Guiseas a part of collaborative care agreement  Orders Placed This Encounter  Procedures  . CULTURE, URINE COMPREHENSIVE  . POCT Urinalysis Dipstick    Meds ordered this encounter  Medications  . sulfamethoxazole-trimethoprim (BACTRIM DS) 800-160 MG tablet    Sig: Take 1 tablet by mouth 2 (two) times daily.    Dispense:  20 tablet    Refill:  0    Order Specific Question:   Supervising Provider    Answer:   KLavera Guise[[[id number]] . fluconazole (DIFLUCAN) 150 MG tablet    Sig: Take 1 tablet po once. May repeat dose in 3 days as needed for persistent symptoms.    Dispense:  3 tablet    Refill:  0    Order Specific Question:   Supervising Provider    Answer:   KLavera Guise[[[id number]] . metroNIDAZOLE (METROGEL) 0.75 % vaginal gel    Sig: Place 1 Applicatorful vaginally at bedtime.    Dispense:  70 g    Refill:  0    Order Specific Question:   Supervising Provider    Answer:   KLavera Guise[[[id number]]   Time spent: 30 Minutes

## 2019-11-14 NOTE — Progress Notes (Signed)
Patient started on bactrim and metronidazole at her in office visit.

## 2019-11-15 LAB — CULTURE, URINE COMPREHENSIVE

## 2019-12-06 ENCOUNTER — Encounter: Payer: Self-pay | Admitting: Nurse Practitioner

## 2019-12-06 ENCOUNTER — Ambulatory Visit
Admission: RE | Admit: 2019-12-06 | Discharge: 2019-12-06 | Disposition: A | Discharge status: Home / Self Care | Payer: Managed Care, Other (non HMO) | Source: Ambulatory Visit | Attending: Nurse Practitioner | Admitting: Nurse Practitioner

## 2019-12-06 ENCOUNTER — Other Ambulatory Visit: Payer: Self-pay

## 2019-12-06 ENCOUNTER — Ambulatory Visit
Admission: RE | Admit: 2019-12-06 | Discharge: 2019-12-06 | Disposition: A | Discharge status: Home / Self Care | Payer: Managed Care, Other (non HMO) | Attending: Nurse Practitioner | Admitting: Nurse Practitioner

## 2019-12-06 ENCOUNTER — Ambulatory Visit: Payer: Managed Care, Other (non HMO) | Admitting: Nurse Practitioner

## 2019-12-06 VITALS — BP 138/60 | HR 70 | Temp 97.9°F | Resp 16 | Ht 64.0 in | Wt 240.0 lb

## 2019-12-06 DIAGNOSIS — R300 Dysuria: Secondary | ICD-10-CM | POA: Diagnosis not present

## 2019-12-06 DIAGNOSIS — J069 Acute upper respiratory infection, unspecified: Secondary | ICD-10-CM | POA: Diagnosis not present

## 2019-12-06 DIAGNOSIS — R0789 Other chest pain: Secondary | ICD-10-CM

## 2019-12-06 DIAGNOSIS — Z79899 Other long term (current) drug therapy: Secondary | ICD-10-CM | POA: Diagnosis not present

## 2019-12-06 DIAGNOSIS — R059 Cough, unspecified: Secondary | ICD-10-CM

## 2019-12-06 DIAGNOSIS — K219 Gastro-esophageal reflux disease without esophagitis: Secondary | ICD-10-CM

## 2019-12-06 DIAGNOSIS — F419 Anxiety disorder, unspecified: Secondary | ICD-10-CM

## 2019-12-06 DIAGNOSIS — B9689 Other specified bacterial agents as the cause of diseases classified elsewhere: Secondary | ICD-10-CM

## 2019-12-06 DIAGNOSIS — M545 Low back pain, unspecified: Secondary | ICD-10-CM

## 2019-12-06 DIAGNOSIS — I480 Paroxysmal atrial fibrillation: Secondary | ICD-10-CM

## 2019-12-06 LAB — POCT URINE DRUG SCREEN
POC Amphetamine UR: NOT DETECTED
POC BENZODIAZEPINES UR: NOT DETECTED
POC Barbiturate UR: NOT DETECTED
POC Cocaine UR: NOT DETECTED
POC Ecstasy UR: NOT DETECTED
POC Marijuana UR: NOT DETECTED
POC Methadone UR: NOT DETECTED
POC Methamphetamine UR: NOT DETECTED
POC Opiate Ur: NOT DETECTED
POC Oxycodone UR: NOT DETECTED
POC PHENCYCLIDINE UR: NOT DETECTED
POC TRICYCLICS UR: NOT DETECTED

## 2019-12-06 LAB — POCT URINALYSIS DIPSTICK
Bilirubin, UA: NEGATIVE
Glucose, UA: NEGATIVE
Ketones, UA: NEGATIVE
Leukocytes, UA: NEGATIVE
Nitrite, UA: NEGATIVE
Protein, UA: NEGATIVE
Spec Grav, UA: 1.01 (ref 1.010–1.025)
Urobilinogen, UA: 0.2 E.U./dL
pH, UA: 5 (ref 5.0–8.0)

## 2019-12-06 MED ORDER — LEVOFLOXACIN 500 MG PO TABS: 500.0000 mg | ORAL_TABLET | Freq: Every day | ORAL | 0 refills | Status: DC

## 2019-12-06 MED ORDER — ALPRAZOLAM 0.5 MG PO TABS: 0.5000 mg | ORAL_TABLET | Freq: Every evening | ORAL | 2 refills | Status: DC | PRN

## 2019-12-06 MED ORDER — OMEPRAZOLE 40 MG PO CPDR: 40.0000 mg | DELAYED_RELEASE_CAPSULE | Freq: Every day | ORAL | 1 refills | Status: DC

## 2019-12-06 NOTE — Progress Notes (Signed)
[facility] [place], [place] [address]  Internal MEDICINE  Office Visit Note  Patient Name: Leslie [name]  [id number]  [id number]  Date of Service: 01/02/2020  Chief Complaint  Patient presents with  . Follow-up  . COPD  . Hyperlipidemia  . Hypertension  . controlled substance form    reviewed with PT  . Medication Refill  . Back Pain  . Cough  . Shortness of Breath  . Sinusitis    The patient is here for follow up visit. She states that she has been having nasal and sinus congestion, sore throat, wheezing and cough, over the past several days. She denies fever, chills, or body aches. She states that symptoms are gradually getting worse. She also continues to have right flank pain. She has had significant renal stones on the right side which required surgical removal. With persistent discomfort, she I worried about development of more stones. She does continues to see and follow up with urology.  -atrial fibrillation and mitral valve stenosis. Treated per cardiology. Warfarin dosing handled per cardiology office.        Current Medication: Outpatient Encounter Medications as of 12/06/2019  Medication Sig  . acetaminophen (TYLENOL) 500 MG tablet Take 1,000 mg by mouth every 6 (six) hours as needed for moderate pain or fever.  . ALPRAZolam (XANAX) 0.5 MG tablet Take 1 tablet (0.5 mg total) by mouth at bedtime as needed for anxiety.  . calcium carbonate (TUMS - DOSED IN MG ELEMENTAL CALCIUM) 500 MG chewable tablet Chew 1 tablet by mouth daily as needed for indigestion or heartburn.   . diltiazem (CARDIZEM CD) 120 MG 24 hr capsule TAKE 1 CAPSULE(120 MG) BY MOUTH DAILY  . fluconazole (DIFLUCAN) 150 MG tablet Take 1 tablet po once. May repeat dose in 3 days as needed for persistent symptoms.  . fluticasone (FLONASE) 50 MCG/ACT nasal spray Place 1 spray into both nostrils daily as needed for allergies or rhinitis.  Marland Kitchen metroNIDAZOLE (METROGEL) 0.75 % vaginal  gel Place 1 Applicatorful vaginally at bedtime.  Marland Kitchen omeprazole (PRILOSEC) 40 MG capsule Take 1 capsule (40 mg total) by mouth daily.  Marland Kitchen torsemide (DEMADEX) 20 MG tablet Take 2 tablets (40 mg total) by mouth 2 (two) times daily.  . [DISCONTINUED] ALPRAZolam (XANAX) 0.5 MG tablet Take 1 tablet (0.5 mg total) by mouth at bedtime as needed for anxiety.  . [DISCONTINUED] atorvastatin (LIPITOR) 80 MG tablet TAKE 1 TABLET BY MOUTH EVERY DAY AT 6 PM  . [DISCONTINUED] metoprolol succinate (TOPROL-XL) 50 MG 24 hr tablet Take 50-100 mg by mouth See admin instructions. Take 50 mg in the morning and 100 mg at night  . [DISCONTINUED] omeprazole (PRILOSEC) 40 MG capsule Take 1 capsule (40 mg total) by mouth daily.  . [DISCONTINUED] potassium chloride (KLOR-CON) 10 MEQ tablet Take 20-30 mEq by mouth See admin instructions. Take 30 meq in the morning and 20 meq at night  . [DISCONTINUED] sulfamethoxazole-trimethoprim (BACTRIM DS) 800-160 MG tablet Take 1 tablet by mouth 2 (two) times daily.  . [DISCONTINUED] warfarin (COUMADIN) 2.5 MG tablet TAKE AS DIRECTED BY ANTI-COAG CLINIC  . [DISCONTINUED] levofloxacin (LEVAQUIN) 500 MG tablet Take 1 tablet (500 mg total) by mouth daily.   No facility-administered encounter medications on file as of 12/06/2019.    Surgical History: Past Surgical History:  Procedure Laterality Date  . ABDOMINAL HYSTERECTOMY     total  . ABDOMINAL HYSTERECTOMY    . BREAST BIOPSY Bilateral 2012  . BREAST BIOPSY Right  08-07-12   fibroadenomatous changes and columnar cells  . BREAST BIOPSY  02/03/2015   stereo byrnett  . CARDIAC CATHETERIZATION    . CHOLECYSTECTOMY N/A 02/27/2016   Procedure: LAPAROSCOPIC CHOLECYSTECTOMY;  Surgeon: Christene Lye, MD;  Location: [facility];  Service: General;  Laterality: N/A;  . COLONOSCOPY WITH PROPOFOL N/A 09/26/2018   Procedure: COLONOSCOPY WITH PROPOFOL;  Surgeon: Lucilla Lame, MD;  Location: [facility];  Service: Endoscopy;  Laterality: N/A;   . CYSTOSCOPY W/ RETROGRADES Right 08/18/2018   Procedure: CYSTOSCOPY WITH RETROGRADE PYELOGRAM;  Surgeon: Billey Co, MD;  Location: [facility];  Service: Urology;  Laterality: Right;  . CYSTOSCOPY/URETEROSCOPY/HOLMIUM LASER/STENT PLACEMENT Right 08/18/2018   Procedure: CYSTOSCOPY/URETEROSCOPY/STENT PLACEMENT;  Surgeon: Billey Co, MD;  Location: [facility];  Service: Urology;  Laterality: Right;  . ESOPHAGOGASTRODUODENOSCOPY (EGD) WITH PROPOFOL N/A 09/26/2018   Procedure: ESOPHAGOGASTRODUODENOSCOPY (EGD) WITH PROPOFOL;  Surgeon: Lucilla Lame, MD;  Location: [facility];  Service: Endoscopy;  Laterality: N/A;  . GIVENS CAPSULE STUDY N/A 11/03/2018   Procedure: GIVENS CAPSULE STUDY;  Surgeon: Lucilla Lame, MD;  Location: [facility];  Service: Endoscopy;  Laterality: N/A;  . JOINT REPLACEMENT Left    knee  . KNEE ARTHROPLASTY Right 04/09/2019   Procedure: COMPUTER ASSISTED TOTAL KNEE ARTHROPLASTY;  Surgeon: Dereck Leep, MD;  Location: [facility];  Service: Orthopedics;  Laterality: Right;  . KNEE CLOSED REDUCTION Left 04/15/2015   Procedure: CLOSED MANIPULATION KNEE;  Surgeon: Thornton Park, MD;  Location: [facility];  Service: Orthopedics;  Laterality: Left;  . KNEE SURGERY    . RIGHT/LEFT HEART CATH AND CORONARY ANGIOGRAPHY Bilateral 09/06/2019   Procedure: RIGHT/LEFT HEART CATH AND CORONARY ANGIOGRAPHY;  Surgeon: Minna Merritts, MD;  Location: [place] [facility];  Service: Cardiovascular;  Laterality: Bilateral;  . TEE WITHOUT CARDIOVERSION N/A 09/13/2017   Procedure: TRANSESOPHAGEAL ECHOCARDIOGRAM (TEE);  Surgeon: Nelva Bush, MD;  Location: [facility];  Service: Cardiovascular;  Laterality: N/A;  . TOTAL KNEE ARTHROPLASTY Left 12/25/2014   Procedure: TOTAL KNEE ARTHROPLASTY;  Surgeon: Thornton Park, MD;  Location: [facility];  Service: Orthopedics;  Laterality: Left;  . TUBAL LIGATION      Medical History: Past Medical History:  Diagnosis Date  . (HFpEF) heart failure  with preserved ejection fraction ([place])    a. 08/2017 Echo: EF 55-60%.  Grade 2 diastolic dysfunction.  Moderate mitral stenosis.  . Allergy   . Anemia   . Anxiety   . BRCA negative 03/22/2013  . Bronchitis 02/19/2016   ON LEVAQUIN PO  . Chest tightness   . Cigarette smoker 09/11/2017   8-10 day  . Dyspnea   . GERD (gastroesophageal reflux disease)   . History of kidney stones   . Hyperlipidemia   . Hypertension   . Interstitial lung disease ([place])    a. CT 2013 b. 02/2018 CXR noted recurrent intersistial changes ILD vs chronic bronchitis  . Moderate mitral stenosis    a.  08/2017 TEE: EF 60 to 65%.  Moderate mitral stenosis.  Mean gradient 14 mmHg.  Valve area 2.59 cm by planimetry, 2.72 cm by pressure half-time.  . Persistent atrial fibrillation ([place])    a. 08/2017 s/p TEE/DCCV; b. CHA2DS2VASc = 2-->warfarin.    Family History: Family History  Problem Relation Age of Onset  . Cancer Mother 33       breast  . Hypertension Mother   . Cancer Maternal Aunt        breast  . Cancer Maternal Grandmother  breast  . Osteoarthritis Father   . Hypertension Father     Social History   Socioeconomic History  . Marital status: Married    Spouse name: Not on file  . Number of children: Not on file  . Years of education: Not on file  . Highest education level: Not on file  Occupational History  . Not on file  Tobacco Use  . Smoking status: Former Smoker    Packs/day: 0.50    Years: 20.00    Pack years: 10.00    Types: Cigarettes    Quit date: 02/20/2018    Years since quitting: 1.8  . Smokeless tobacco: Never Used  Vaping Use  . Vaping Use: Never used  Substance and Sexual Activity  . Alcohol use: Yes    Comment: rarely  . Drug use: No  . Sexual activity: Not on file  Other Topics Concern  . Not on file  Social History Narrative  . Not on file   Social Determinants of Health   Financial Resource Strain: Not on file  Food Insecurity: Not on file   Transportation Needs: Not on file  Physical Activity: Not on file  Stress: Not on file  Social Connections: Not on file  Intimate Partner Violence: Not on file      Review of Systems  Constitutional: Positive for fatigue. Negative for activity change, chills and unexpected weight change.  HENT: Positive for congestion, postnasal drip, rhinorrhea, sinus pressure, sinus pain and sore throat. Negative for sneezing.   Respiratory: Positive for cough and wheezing. Negative for chest tightness and shortness of breath.   Cardiovascular: Positive for palpitations. Negative for chest pain.  Gastrointestinal: Positive for nausea. Negative for abdominal pain, constipation, diarrhea and vomiting.  Endocrine: Negative for cold intolerance, heat intolerance, polydipsia and polyuria.  Genitourinary: Positive for flank pain and pelvic pain. Negative for dysuria and frequency.  Musculoskeletal: Positive for back pain. Negative for arthralgias, joint swelling and neck pain.  Skin: Negative for rash.  Allergic/Immunologic: Positive for environmental allergies.  Neurological: Positive for headaches. Negative for dizziness, tremors and numbness.  Hematological: Negative for adenopathy. Does not bruise/bleed easily.  Psychiatric/Behavioral: Negative for behavioral problems (Depression), sleep disturbance and suicidal ideas. The patient is not nervous/anxious.     Today's Vitals   12/06/19 1044  BP: 138/60  Pulse: 70  Resp: 16  Temp: 97.9 F (36.6 C)  SpO2: 98%  Weight: 240 lb (108.9 kg)  Height: '5\' 4"'  (1.626 m)   Body mass index is 41.2 kg/m.  Physical Exam Vitals and nursing note reviewed.  Constitutional:      General: She is not in acute distress.    Appearance: Normal appearance. She is well-developed and well-nourished. She is obese. She is ill-appearing. She is not diaphoretic.  HENT:     Head: Normocephalic and atraumatic.     Right Ear: Tympanic membrane is bulging.     Left Ear:  Tympanic membrane is bulging.     Nose: Congestion present.     Right Turbinates: Enlarged.     Right Sinus: Maxillary sinus tenderness and frontal sinus tenderness present.     Left Sinus: Maxillary sinus tenderness and frontal sinus tenderness present.     Mouth/Throat:     Mouth: Oropharynx is clear and moist.     Pharynx: No oropharyngeal exudate.  Eyes:     Extraocular Movements: EOM normal.     Pupils: Pupils are equal, round, and reactive to light.  Neck:  Thyroid: No thyromegaly.     Vascular: No JVD.     Trachea: No tracheal deviation.  Cardiovascular:     Rate and Rhythm: Tachycardia present. Rhythm irregular.     Heart sounds: Murmur heard.  No friction rub. No gallop.   Pulmonary:     Effort: Pulmonary effort is normal. No respiratory distress.     Breath sounds: Wheezing present. No rales.     Comments: Congested, non productive cough noted.  Chest:     Chest wall: No tenderness.  Abdominal:     Palpations: Abdomen is soft.  Genitourinary:    Comments: U/a positive for trace blood only  Musculoskeletal:        General: Normal range of motion.     Cervical back: Normal range of motion and neck supple.  Lymphadenopathy:     Cervical: Cervical adenopathy present.  Skin:    General: Skin is warm and dry.  Neurological:     General: No focal deficit present.     Mental Status: She is alert and oriented to person, place, and time.     Cranial Nerves: No cranial nerve deficit.  Psychiatric:        Mood and Affect: Mood and affect and mood normal.        Behavior: Behavior normal.        Thought Content: Thought content normal.        Judgment: Judgment normal.    Assessment/Plan: 1. Acute upper respiratory infection Start levofloxacin 528m daily for ten days. Recommend rest and increase fluids. Take OTC medications as needed and as indicated for acute symptoms.   2. Cough Will get chest x-ra yfor further evaluation.  - DG Chest 2 View; Future  3.  Tightness in chest Will get chest x-ra yfor further evaluation.  - DG Chest 2 View; Future  4. Paroxysmal atrial fibrillation ([place] Continue regular visits with cardiology as scheduled.   5. Low back pain, unspecified back pain laterality, unspecified chronicity, unspecified whether sciatica present Believe this to be musculoskeletal in nature. She should use muscle relaxers as previously prescribed and apply heat to effected area of the back.   6. Dysuria Trace blood present. Will send urine for culture and sensitivity and treat as indicated for infection.  - POCT Urinalysis Dipstick - CULTURE, URINE COMPREHENSIVE  7. Anxiety May take alprazolam 0.56mat bedtime as needed for acute anxiety. New prescription sent to her pharmacy today.  - ALPRAZolam (XANAX) 0.5 MG tablet; Take 1 tablet (0.5 mg total) by mouth at bedtime as needed for anxiety.  Dispense: 30 tablet; Refill: 2  8. Gastroesophageal reflux disease without esophagitis Take omeprazole as previously prescribed  - omeprazole (PRILOSEC) 40 MG capsule; Take 1 capsule (40 mg total) by mouth daily.  Dispense: 90 capsule; Refill: 1  9. Encounter for long-term (current) use of high-risk medication - POCT Urine Drug Screen negative for all controlled substances.   General Counseling: Shayley verbalizes understanding of the findings of todays visit and agrees with plan of treatment. I have discussed any further diagnostic evaluation that may be needed or ordered today. We also reviewed her medications today. she has been encouraged to call the office with any questions or concerns that should arise related to todays visit.  This patient was seen by HeLeretha PolNP Collaboration with Dr FoLavera Guises a part of collaborative care agreement  Orders Placed This Encounter  Procedures  . CULTURE, URINE COMPREHENSIVE  . DG Chest 2 View  .  POCT Urine Drug Screen  . POCT Urinalysis Dipstick    Meds ordered this encounter   Medications  . DISCONTD: levofloxacin (LEVAQUIN) 500 MG tablet    Sig: Take 1 tablet (500 mg total) by mouth daily.    Dispense:  10 tablet    Refill:  0    Order Specific Question:   Supervising Provider    Answer:   Lavera Guise [[id number]]  . ALPRAZolam (XANAX) 0.5 MG tablet    Sig: Take 1 tablet (0.5 mg total) by mouth at bedtime as needed for anxiety.    Dispense:  30 tablet    Refill:  2    Order Specific Question:   Supervising Provider    Answer:   Lavera Guise [[id number]]  . omeprazole (PRILOSEC) 40 MG capsule    Sig: Take 1 capsule (40 mg total) by mouth daily.    Dispense:  90 capsule    Refill:  1    Order Specific Question:   Supervising Provider    Answer:   Lavera Guise [[id number]]    Total time spent: 6 Minutes   Time spent includes review of chart, medications, test results, and follow up plan with the patient.      Dr Lavera Guise Internal medicine

## 2019-12-08 NOTE — Progress Notes (Signed)
CXR continue to show scar tissue, needs to have CT chest W/O contrast. Dx covid pneumonia

## 2019-12-11 LAB — CULTURE, URINE COMPREHENSIVE

## 2019-12-12 ENCOUNTER — Other Ambulatory Visit: Payer: Self-pay | Admitting: Cardiovascular Disease

## 2019-12-15 NOTE — Progress Notes (Signed)
Started on levofloxacin at time of visit

## 2019-12-19 ENCOUNTER — Ambulatory Visit (INDEPENDENT_AMBULATORY_CARE_PROVIDER_SITE_OTHER): Payer: Managed Care, Other (non HMO)

## 2019-12-19 ENCOUNTER — Other Ambulatory Visit: Payer: Self-pay | Admitting: Cardiovascular Disease

## 2019-12-19 ENCOUNTER — Other Ambulatory Visit: Payer: Self-pay

## 2019-12-19 DIAGNOSIS — I050 Rheumatic mitral stenosis: Secondary | ICD-10-CM

## 2019-12-19 DIAGNOSIS — Z5181 Encounter for therapeutic drug level monitoring: Secondary | ICD-10-CM | POA: Diagnosis not present

## 2019-12-19 DIAGNOSIS — I4891 Unspecified atrial fibrillation: Secondary | ICD-10-CM

## 2019-12-19 LAB — POCT INR: INR: 2.3 (ref 2.0–3.0)

## 2019-12-19 NOTE — Patient Instructions (Signed)
-   continue warfarin dosage of 1.25 mg (half the 2.5 mg tablet) every day.   - recheck INR in 6 weeks

## 2019-12-20 ENCOUNTER — Ambulatory Visit: Payer: Managed Care, Other (non HMO) | Admitting: Nurse Practitioner

## 2019-12-26 NOTE — Progress Notes (Signed)
Date:  12/31/2019   ID:  Leslie [name], DOB Oct 25, 1965, MRN [id number]   PCP:  Ronnell Freshwater, NP  Cardiologist:  Ida Rogue, MD  Electrophysiologist:  None   Evaluation Performed:  Follow-Up Visit   Chief Complaint  Patient presents with  . Follow-up    6 month F/U-Patient reports feeling her heart racing intermittently; Meds verbally reviewed with patient.    History of Present Illness:    Leslie [name] is a 54 y.o. female with  mitral valve stenosis morbid obesity,  Snores, possible OSA anxiety,  hyperlipidemia,  Hospital admission 09/11/2017 for  shortness of breath chest tightness in atrial fibrillation with RVR Had TEE  , Cardioversion Mild COPD, long hx of smoking Cardiac CTA Coronary calcium score of 10. Who presents for follow up of her paroxysmal atrial fibrillation, mitral valve stenosis  In follow-up today reports she feels things are in a holding pattern Having some back pain, prior kidney stones UA negative x2, strep on culture Scheduled appointment with nephrology, meant to schedule with urology  Insurance did not cover dentist Found another dentist, expensive Went to another dentist, still pricey Seeing one tomorrow Reports that she cannot afford 1700 to 2500 dollars to pull 3 teeth She will keep looking  Feels like she might have fluid, denies leg edema, may be some abdominal distention Labs reviewed CR 1.7, above her baseline HGB 10.2  Periods of tachycardia, shortness of breath  Continues to take torsemide 40 twice daily  echocardiogram with severe mitral valve stenosis, moderately elevated right heart pressures  Enjoyed her meeting with Dr. Roxy Manns CT surgery She is looking forward to having her valve fixed  EKG personally reviewed by myself on todays visit Shows normal sinus rhythm rate 67 bpm no significant ST-T wave changes  Other past medical history reviewed Echocardiogram August 2020  mitral valve Moderate thickening of  the mitral valve leaflet. Moderate-severe mitral valve stenosis, with mean gradient of 16 mmHg and MVA by continuity equation of 1.0 cm^2.   Transesophageal echo August 2019 with moderate mitral valve stenosis mean gradient of 14 Mobility of the posterior leaflet was restricted.   summer 2020 developed right flank pain,   underwent right ureteroscopy, laser lithotripsy, and stent placement on 08/18/2018. Had pyelonephritis post procedure Creatinine trended up  to 4.1  acute kidney injury and sepsis in the setting of recent right ureteral stone status post lithotripsy and likely pyelonephritis.  atrial fibrillation December 2019 01/05/2018 awoke suddenly with tachypalpitations  rates in the 130s  EMS was called, noted to be in sinus rhythm with heart rates in the 80s to 90s bpm.   Cardiac CTA 1. Coronary calcium score of 10. This was 69 percentile for age and sex matched control.. Normal coronary origin with right dominance.Mild non-obstructive CAD.   The patient does not have symptoms concerning for COVID-19 infection (fever, chills, cough, or new shortness of breath).    Past Medical History:  Diagnosis Date  . (HFpEF) heart failure with preserved ejection fraction ([place])    a. 08/2017 Echo: EF 55-60%.  Grade 2 diastolic dysfunction.  Moderate mitral stenosis.  . Allergy   . Anemia   . Anxiety   . BRCA negative 03/22/2013  . Bronchitis 02/19/2016   ON LEVAQUIN PO  . Chest tightness   . Cigarette smoker 09/11/2017   8-10 day  . Dyspnea   . GERD (gastroesophageal reflux disease)   . History of kidney stones   . Hyperlipidemia   .  Hypertension   . Interstitial lung disease ([place])    a. CT 2013 b. 02/2018 CXR noted recurrent intersistial changes ILD vs chronic bronchitis  . Moderate mitral stenosis    a.  08/2017 TEE: EF 60 to 65%.  Moderate mitral stenosis.  Mean gradient 14 mmHg.  Valve area 2.59 cm by planimetry, 2.72 cm by pressure half-time.  . Persistent atrial  fibrillation ([place])    a. 08/2017 s/p TEE/DCCV; b. CHA2DS2VASc = 2-->warfarin.   Past Surgical History:  Procedure Laterality Date  . ABDOMINAL HYSTERECTOMY     total  . ABDOMINAL HYSTERECTOMY    . BREAST BIOPSY Bilateral 2012  . BREAST BIOPSY Right 08-07-12   fibroadenomatous changes and columnar cells  . BREAST BIOPSY  02/03/2015   stereo byrnett  . CARDIAC CATHETERIZATION    . CHOLECYSTECTOMY N/A 02/27/2016   Procedure: LAPAROSCOPIC CHOLECYSTECTOMY;  Surgeon: Christene Lye, MD;  Location: [facility];  Service: General;  Laterality: N/A;  . COLONOSCOPY WITH PROPOFOL N/A 09/26/2018   Procedure: COLONOSCOPY WITH PROPOFOL;  Surgeon: Lucilla Lame, MD;  Location: [facility];  Service: Endoscopy;  Laterality: N/A;  . CYSTOSCOPY W/ RETROGRADES Right 08/18/2018   Procedure: CYSTOSCOPY WITH RETROGRADE PYELOGRAM;  Surgeon: Billey Co, MD;  Location: [facility];  Service: Urology;  Laterality: Right;  . CYSTOSCOPY/URETEROSCOPY/HOLMIUM LASER/STENT PLACEMENT Right 08/18/2018   Procedure: CYSTOSCOPY/URETEROSCOPY/STENT PLACEMENT;  Surgeon: Billey Co, MD;  Location: [facility];  Service: Urology;  Laterality: Right;  . ESOPHAGOGASTRODUODENOSCOPY (EGD) WITH PROPOFOL N/A 09/26/2018   Procedure: ESOPHAGOGASTRODUODENOSCOPY (EGD) WITH PROPOFOL;  Surgeon: Lucilla Lame, MD;  Location: [facility];  Service: Endoscopy;  Laterality: N/A;  . GIVENS CAPSULE STUDY N/A 11/03/2018   Procedure: GIVENS CAPSULE STUDY;  Surgeon: Lucilla Lame, MD;  Location: [facility] ENDOSCOPY;  Service: Endoscopy;  Laterality: N/A;  . JOINT REPLACEMENT Left    knee  . KNEE ARTHROPLASTY Right 04/09/2019   Procedure: COMPUTER ASSISTED TOTAL KNEE ARTHROPLASTY;  Surgeon: Dereck Leep, MD;  Location: [facility];  Service: Orthopedics;  Laterality: Right;  . KNEE CLOSED REDUCTION Left 04/15/2015   Procedure: CLOSED MANIPULATION KNEE;  Surgeon: Thornton Park, MD;  Location: [facility];  Service: Orthopedics;  Laterality: Left;  . KNEE  SURGERY    . RIGHT/LEFT HEART CATH AND CORONARY ANGIOGRAPHY Bilateral 09/06/2019   Procedure: RIGHT/LEFT HEART CATH AND CORONARY ANGIOGRAPHY;  Surgeon: Minna Merritts, MD;  Location: [place] [facility];  Service: Cardiovascular;  Laterality: Bilateral;  . TEE WITHOUT CARDIOVERSION N/A 09/13/2017   Procedure: TRANSESOPHAGEAL ECHOCARDIOGRAM (TEE);  Surgeon: Nelva Bush, MD;  Location: [facility];  Service: Cardiovascular;  Laterality: N/A;  . TOTAL KNEE ARTHROPLASTY Left 12/25/2014   Procedure: TOTAL KNEE ARTHROPLASTY;  Surgeon: Thornton Park, MD;  Location: [facility];  Service: Orthopedics;  Laterality: Left;  . TUBAL LIGATION       Current Meds  Medication Sig  . acetaminophen (TYLENOL) 500 MG tablet Take 1,000 mg by mouth every 6 (six) hours as needed for moderate pain or fever.  . ALPRAZolam (XANAX) 0.5 MG tablet Take 1 tablet (0.5 mg total) by mouth at bedtime as needed for anxiety.  Marland Kitchen atorvastatin (LIPITOR) 80 MG tablet TAKE 1 TABLET BY MOUTH EVERY DAY AT 6 PM  . calcium carbonate (TUMS - DOSED IN MG ELEMENTAL CALCIUM) 500 MG chewable tablet Chew 1 tablet by mouth daily as needed for indigestion or heartburn.   . diltiazem (CARDIZEM CD) 120 MG 24 hr capsule TAKE 1 CAPSULE(120 MG) BY MOUTH DAILY  . fluconazole (  DIFLUCAN) 150 MG tablet Take 1 tablet po once. May repeat dose in 3 days as needed for persistent symptoms.  . fluticasone (FLONASE) 50 MCG/ACT nasal spray Place 1 spray into both nostrils daily as needed for allergies or rhinitis.  . metoprolol succinate (TOPROL-XL) 50 MG 24 hr tablet TAKE 1 TABLET BY MOUTH EVERY MORNING AND 2 TABLETS BY MOUTH EVERY NIGHT AT BEDTIME  . metroNIDAZOLE (METROGEL) 0.75 % vaginal gel Place 1 Applicatorful vaginally at bedtime.  Marland Kitchen omeprazole (PRILOSEC) 40 MG capsule Take 1 capsule (40 mg total) by mouth daily.  . potassium chloride (KLOR-CON) 10 MEQ tablet TAKE 4 TABLETS(40 MEQ) BY MOUTH DAILY  . torsemide (DEMADEX) 20 MG tablet Take 2 tablets (40  mg total) by mouth 2 (two) times daily.  Marland Kitchen warfarin (COUMADIN) 2.5 MG tablet TAKE AS DIRECTED BY ANTI-COAG CLINIC     Allergies:   Morphine, Oxycodone hcl, and Dilaudid [hydromorphone hcl]   Social History   Tobacco Use  . Smoking status: Former Smoker    Packs/day: 0.50    Years: 20.00    Pack years: 10.00    Types: Cigarettes    Quit date: 02/20/2018    Years since quitting: 1.8  . Smokeless tobacco: Never Used  Vaping Use  . Vaping Use: Never used  Substance Use Topics  . Alcohol use: Yes    Comment: rarely  . Drug use: No     Family Hx: The patient's family history includes Cancer in her maternal aunt and maternal grandmother; Cancer (age of onset: 40) in her mother; Hypertension in her father and mother; Osteoarthritis in her father.  ROS:   Please see the history of present illness.    Review of Systems  Constitutional: Negative.   Respiratory: Negative.   Cardiovascular: Negative.   Gastrointestinal: Negative.   Musculoskeletal: Negative.   Neurological: Negative.   Psychiatric/Behavioral: Negative.   All other systems reviewed and are negative.     Labs/Other Tests and Data Reviewed:      Recent Labs: 03/30/2019: ALT 22 09/05/2019: BUN 23; Creatinine, Ser 1.71; Hemoglobin 10.2; Platelets 323; Potassium 3.4; Sodium 137   Recent Lipid Panel Lab Results  Component Value Date/Time   CHOL 171 09/19/2018 09:32 AM   CHOL 170 04/07/2018 09:10 AM   TRIG 186 (H) 09/19/2018 09:32 AM   HDL 33 (L) 09/19/2018 09:32 AM   HDL 34 (L) 04/07/2018 09:10 AM   CHOLHDL 5.2 09/19/2018 09:32 AM   LDLCALC 101 (H) 09/19/2018 09:32 AM   LDLCALC 109 (H) 04/07/2018 09:10 AM    Wt Readings from Last 3 Encounters:  12/31/19 240 lb (108.9 kg)  12/06/19 240 lb (108.9 kg)  11/12/19 236 lb (107 kg)     Objective:    Vital Signs:  BP 120/60 (BP Location: Left Arm, Patient Position: Sitting, Cuff Size: Large)   Pulse 67   Ht '5\' 4"'  (1.626 m)   Wt 240 lb (108.9 kg)   SpO2 97%    BMI 41.20 kg/m   Constitutional:  oriented to person, place, and time. No distress.  Obese HENT:  Head: Grossly normal Eyes:  no discharge. No scleral icterus.  Neck: No JVD, no carotid bruits  Cardiovascular: Regular rate and rhythm, 2/6 SEM LSB  appreciated Pulmonary/Chest: Clear to auscultation bilaterally, no wheezes or rails Abdominal: Soft.  no distension.  no tenderness.  Musculoskeletal: Normal range of motion Neurological:  normal muscle tone. Coordination normal. No atrophy Skin: Skin warm and dry Psychiatric: normal affect, pleasant  ASSESSMENT & PLAN:    1. Mitral valve stenosis With moderate pulmonary hypertension She prefers surgical option versus valvuloplasty Evaluated by Dr. Roxy Manns, candidate for surgery Possibly with associated appendage clip, maze Needs to have her dental work done prior to procedure Reports she is unable to afford 1700 up to $2500 to have several teeth pulled, looking for dental assistance She will call us after she meets with local dentist  2.  Paroxysmal atrial fibrillation On anticoagulation, metoprolol, maintaining normal sinus rhythm  3.  Morbid obesity Calorie restriction recommended, low grade walking program   Tests Ordered: Orders Placed This Encounter  Procedures  . EKG 12-Lead    Medication Changes: No orders of the defined types were placed in this encounter.    Signed, Ida Rogue, MD  12/31/2019 10:34 AM    [facility]

## 2019-12-31 ENCOUNTER — Ambulatory Visit (INDEPENDENT_AMBULATORY_CARE_PROVIDER_SITE_OTHER): Payer: Managed Care, Other (non HMO) | Admitting: Cardiovascular Disease

## 2019-12-31 ENCOUNTER — Other Ambulatory Visit: Payer: Self-pay

## 2019-12-31 ENCOUNTER — Encounter: Payer: Self-pay | Admitting: Cardiovascular Disease

## 2019-12-31 VITALS — BP 120/60 | HR 67 | Ht 64.0 in | Wt 240.0 lb

## 2019-12-31 DIAGNOSIS — I5032 Chronic diastolic (congestive) heart failure: Secondary | ICD-10-CM | POA: Diagnosis not present

## 2019-12-31 DIAGNOSIS — I480 Paroxysmal atrial fibrillation: Secondary | ICD-10-CM

## 2019-12-31 DIAGNOSIS — F17200 Nicotine dependence, unspecified, uncomplicated: Secondary | ICD-10-CM

## 2019-12-31 DIAGNOSIS — I050 Rheumatic mitral stenosis: Secondary | ICD-10-CM

## 2019-12-31 DIAGNOSIS — I4891 Unspecified atrial fibrillation: Secondary | ICD-10-CM | POA: Diagnosis not present

## 2019-12-31 DIAGNOSIS — E7800 Pure hypercholesterolemia, unspecified: Secondary | ICD-10-CM

## 2019-12-31 NOTE — Patient Instructions (Addendum)
Medication Instructions:  No changes  If you need a refill on your cardiac medications before your next appointment, please call your pharmacy.    Lab work: BMP , CBC drawn in lab today while in clinic    If your tests are completely normal, you will receive your results only by: Marland Kitchen MyChart Message (if you have MyChart) OR . A paper copy in the mail If you have any lab test that is abnormal or we need to change your treatment, we will call you to review the results.   Testing/Procedures: No new testing needed   Follow-Up: At [facility], you and your health needs are our priority.  As part of our continuing mission to provide you with exceptional heart care, we have created designated Provider Care Teams.  These Care Teams include your primary Cardiologist (physician) and Advanced Practice Providers (APPs -  Physician Assistants and Nurse Practitioners) who all work together to provide you with the care you need, when you need it.  . You will need a follow up appointment in 6 months  . Providers on your designated Care Team:   . Murray Hodgkins, NP . Christell Faith, PA-C . Marrianne Mood, PA-C  Any Other Special Instructions Will Be Listed Below (If Applicable).  COVID-19 Vaccine Information can be found at: [email or web address].uk For questions related to vaccine distribution or appointments, please email [email or web address] or call [phone].

## 2020-01-01 LAB — CBC
Hematocrit: 34.4 % (ref 34.0–46.6)
Hemoglobin: 11.9 g/dL (ref 11.1–15.9)
MCH: 29.8 pg (ref 26.6–33.0)
MCHC: 34.6 g/dL (ref 31.5–35.7)
MCV: 86 fL (ref 79–97)
Platelets: 383 10*3/uL (ref 150–450)
RBC: 3.99 x10E6/uL (ref 3.77–5.28)
RDW: 13 % (ref 11.7–15.4)
WBC: 6.2 10*3/uL (ref 3.4–10.8)

## 2020-01-01 LAB — BASIC METABOLIC PANEL
BUN/Creatinine Ratio: 14 (ref 9–23)
BUN: 16 mg/dL (ref 6–24)
CO2: 26 mmol/L (ref 20–29)
Calcium: 9.1 mg/dL (ref 8.7–10.2)
Chloride: 101 mmol/L (ref 96–106)
Creatinine, Ser: 1.18 mg/dL — ABNORMAL HIGH (ref 0.57–1.00)
GFR calc Af Amer: 61 mL/min/{1.73_m2} (ref >59)
GFR calc non Af Amer: 53 mL/min/{1.73_m2} — ABNORMAL LOW (ref >59)
Glucose: 98 mg/dL (ref 65–99)
Potassium: 3.7 mmol/L (ref 3.5–5.2)
Sodium: 141 mmol/L (ref 134–144)

## 2020-01-02 DIAGNOSIS — R0789 Other chest pain: Secondary | ICD-10-CM | POA: Insufficient documentation

## 2020-01-02 DIAGNOSIS — Z79899 Other long term (current) drug therapy: Secondary | ICD-10-CM | POA: Insufficient documentation

## 2020-01-07 ENCOUNTER — Other Ambulatory Visit: Payer: Self-pay

## 2020-01-07 DIAGNOSIS — B3731 Acute candidiasis of vulva and vagina: Secondary | ICD-10-CM

## 2020-01-07 DIAGNOSIS — B373 Candidiasis of vulva and vagina: Secondary | ICD-10-CM

## 2020-01-07 MED ORDER — FLUCONAZOLE 150 MG PO TABS: ORAL_TABLET | ORAL | 0 refills | Status: DC

## 2020-01-09 ENCOUNTER — Other Ambulatory Visit (HOSPITAL_COMMUNITY): Payer: Managed Care, Other (non HMO) | Admitting: Dentistry

## 2020-01-15 NOTE — Progress Notes (Signed)
Pt's lab printed and mailed to her, pt has not seen her results on her MyChart as of 01/14/2021, uploaded on 01/09/2020 by Dr. Mariah Milling.

## 2020-01-17 ENCOUNTER — Telehealth: Payer: Self-pay

## 2020-01-17 ENCOUNTER — Other Ambulatory Visit: Payer: Self-pay | Admitting: Nurse Practitioner

## 2020-01-17 DIAGNOSIS — N390 Urinary tract infection, site not specified: Secondary | ICD-10-CM

## 2020-01-17 DIAGNOSIS — R059 Cough, unspecified: Secondary | ICD-10-CM

## 2020-01-17 DIAGNOSIS — J069 Acute upper respiratory infection, unspecified: Secondary | ICD-10-CM

## 2020-01-17 MED ORDER — AMOXICILLIN-POT CLAVULANATE 875-125 MG PO TABS: 1.0000 | ORAL_TABLET | Freq: Two times a day (BID) | ORAL | 0 refills | Status: DC

## 2020-01-17 MED ORDER — HYDROCOD POLST-CPM POLST ER 10-8 MG/5ML PO SUER: 5.0000 mL | Freq: Two times a day (BID) | ORAL | 0 refills | Status: DC | PRN

## 2020-01-17 NOTE — Telephone Encounter (Signed)
Please let her know is sent in augmentin 875mg  twice daily. I also added tussionex for cough. Take with caution. No driving or working after taking this. hould also take OTC zinc, vitamin d, and vitamin c every day. Can also use otc medications as needed and as indicated for symptom management  she should also get new test for covid. Thanks

## 2020-01-17 NOTE — Telephone Encounter (Signed)
Pt informed of abx sent to pharmacy and instructions of other OTC meds to use.  Also for her to take another covid test

## 2020-01-18 IMAGING — CT CT CHEST WITHOUT CONTRAST
2 of 4 series · 15 of 36 positions shown, 18 images · non-contrast
Comparison: None.

CLINICAL DATA: Shortness of breath

EXAM:
CT CHEST WITHOUT CONTRAST
TECHNIQUE: Multidetector CT imaging of the chest was performed following the
standard protocol without IV contrast.

[Series 2: thorax · axial · 0.74mm/px · z∈[-284,-64]mm · 12 of 130 slices shown, 15 images]
[im 10/130  mediastinal]
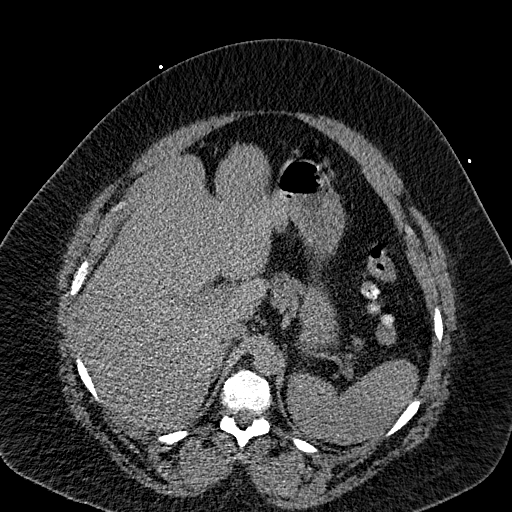
[im 10/130  lung]
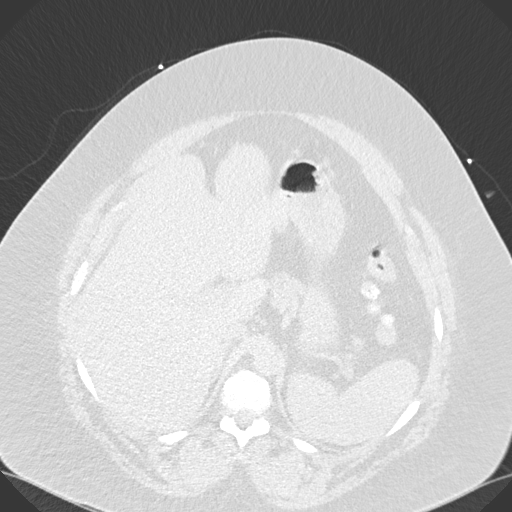
[im 20/130  lung]
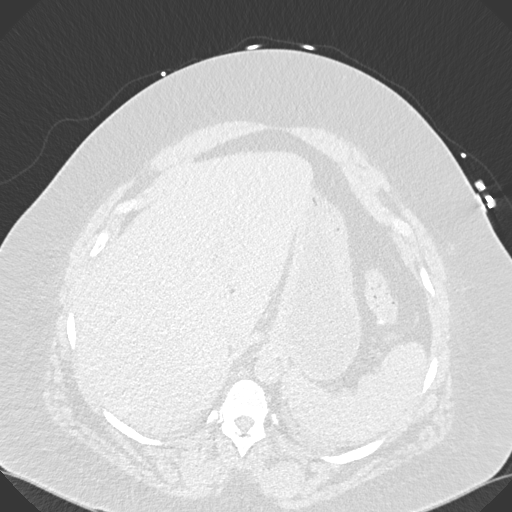
[im 30/130  lung]
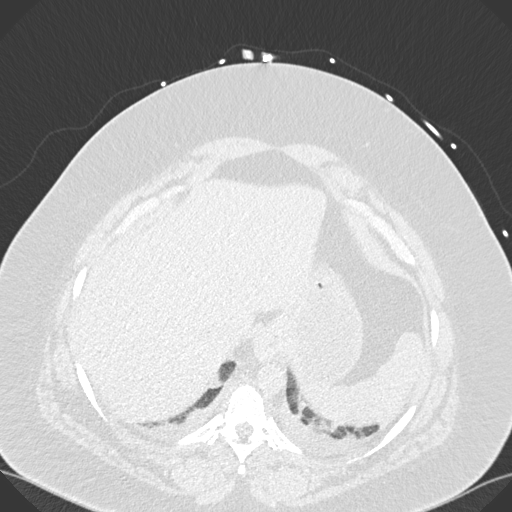
[im 40/130  lung]
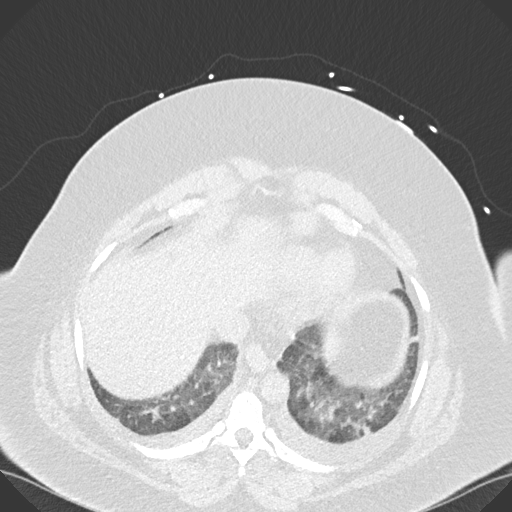
[im 50/130  mediastinal]
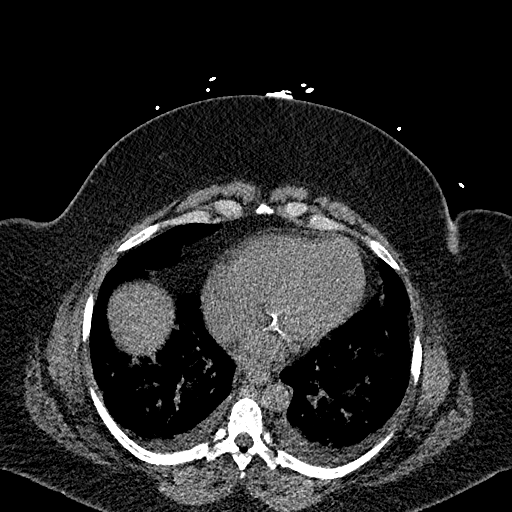
[im 50/130  lung]
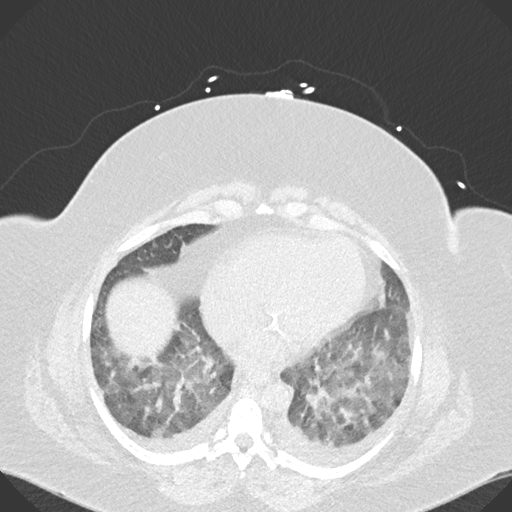
[im 60/130  lung]
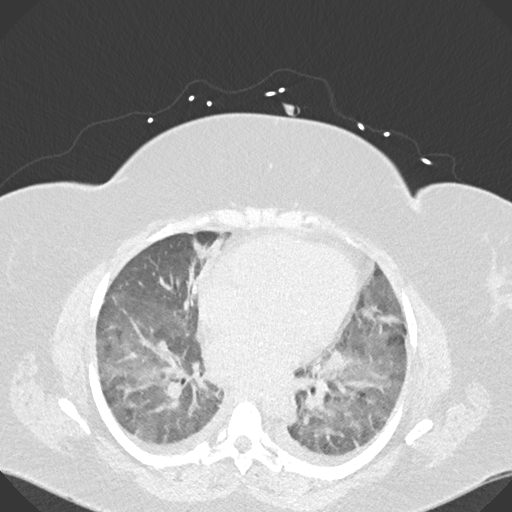
[im 70/130  lung]
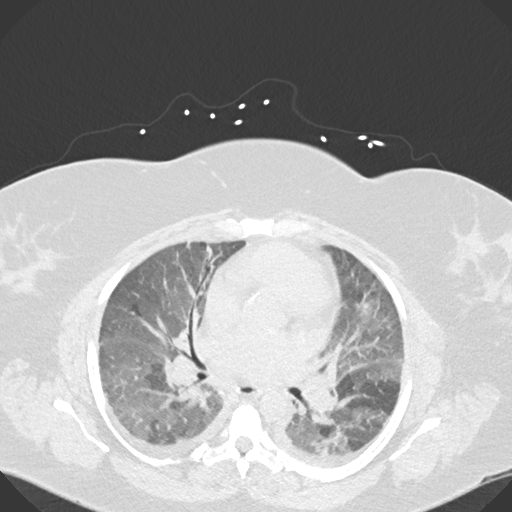
[im 80/130  lung]
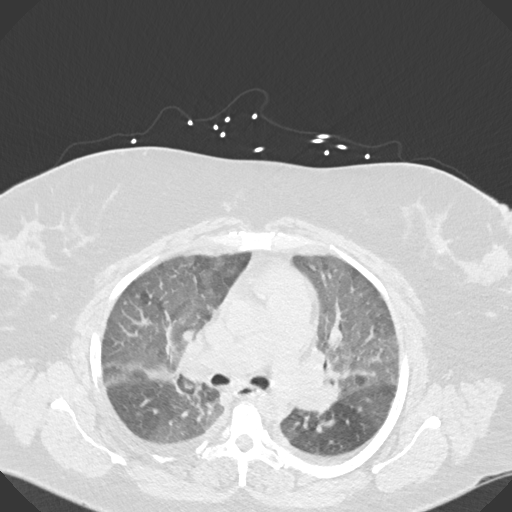
[im 90/130  mediastinal]
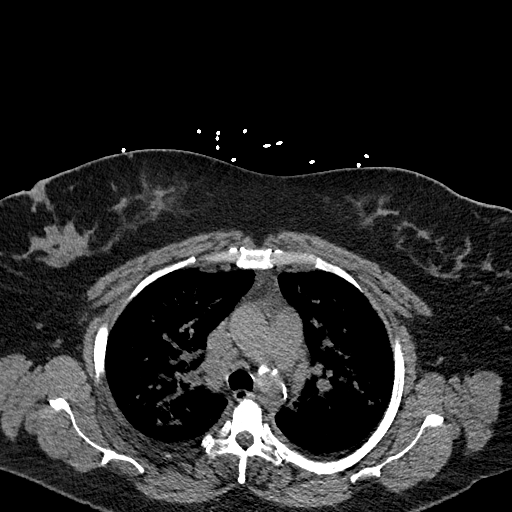
[im 90/130  lung]
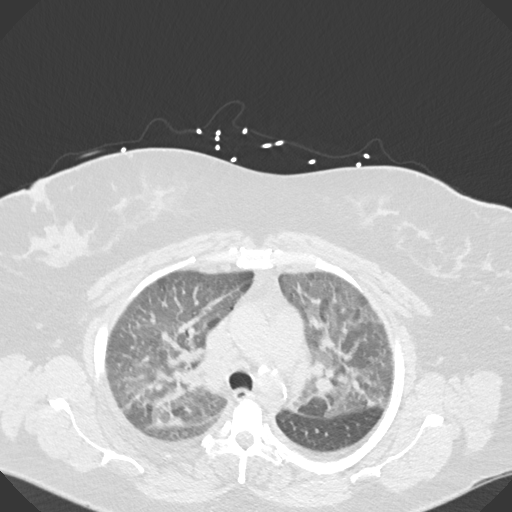
[im 100/130  lung]
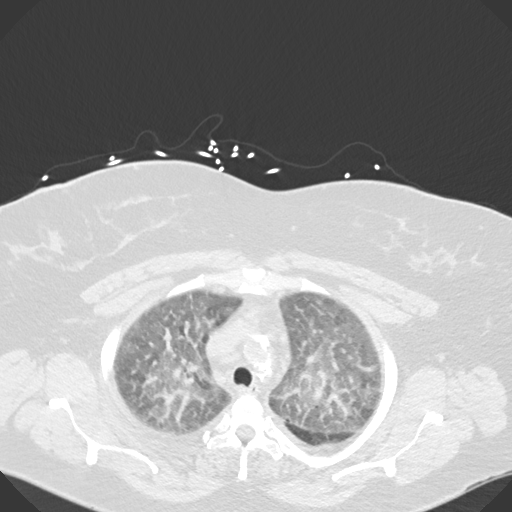
[im 110/130  lung]
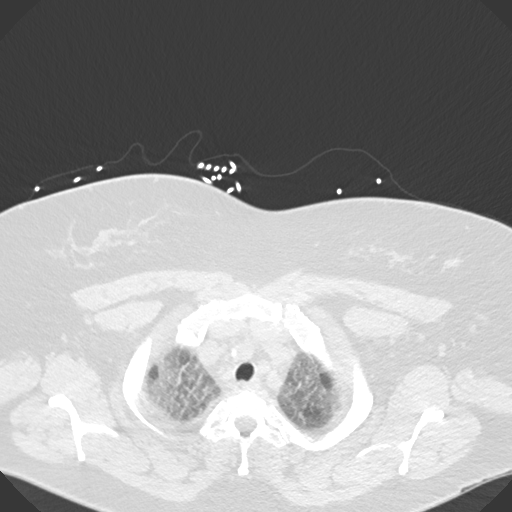
[im 120/130  lung]
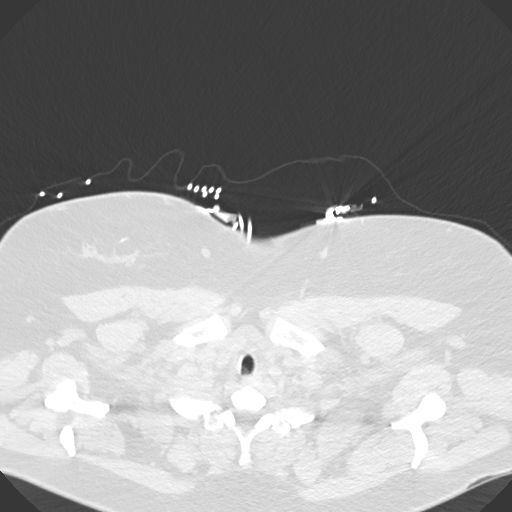

[Series 5: coronal · coronal · 0.53mm/px · 3 of 170 slices shown]
[im 34/170  lung]
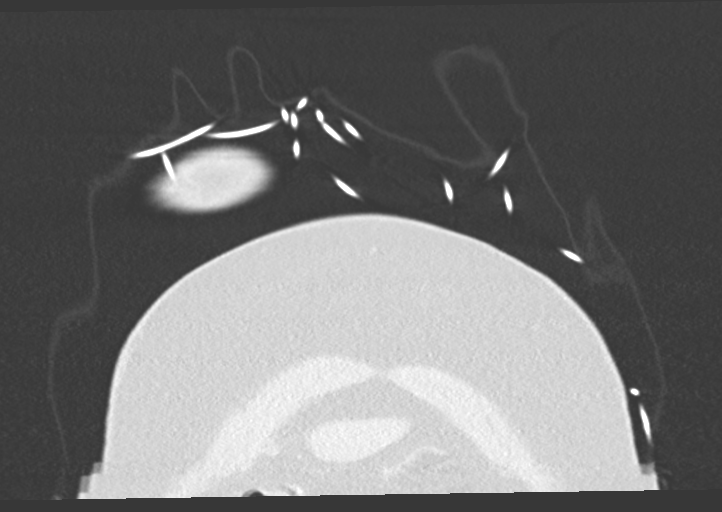
[im 68/170  lung]
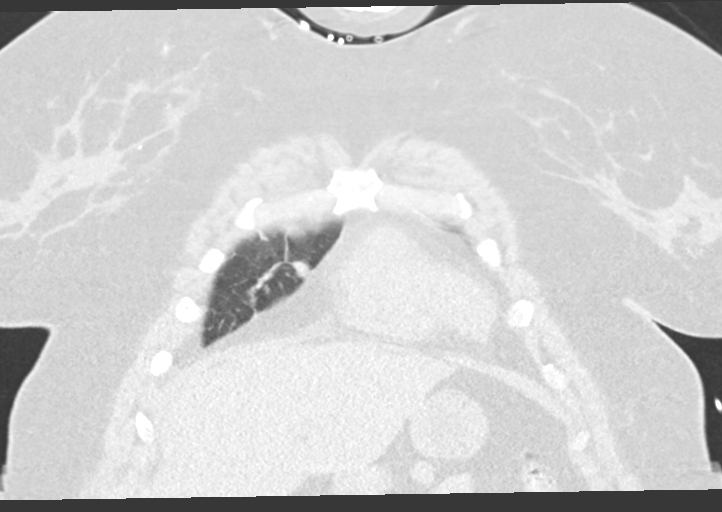
[im 102/170  lung]
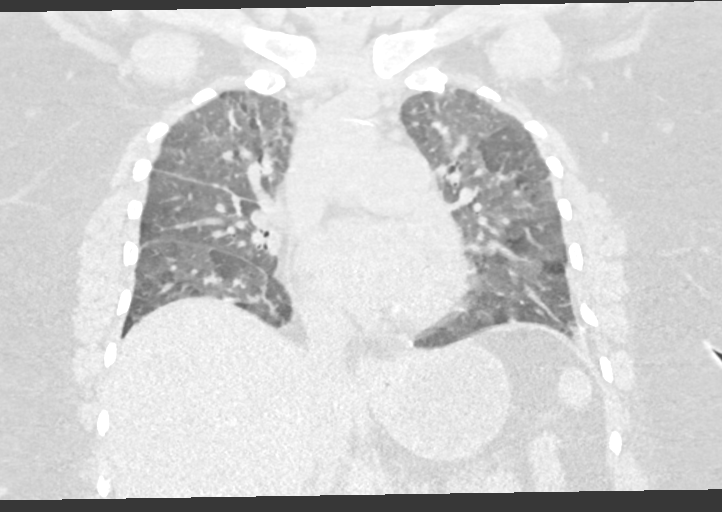

[15 of 36 positions shown; findings below may reference images not displayed]

FINDINGS: Cardiovascular: The heart size is mildly enlarged. Aortic
calcifications are noted. The main pulmonary artery is dilated
measuring up to approximately 3.3 cm in diameter. There is no
significant pericardial effusion.

Mediastinum/Nodes:

--there prominent mediastinal and hilar lymph nodes bilaterally.

--No axillary lymphadenopathy.

--No supraclavicular lymphadenopathy.

--Normal thyroid gland.

--The esophagus is unremarkable

Lungs/Pleura: There are small bilateral pleural effusions. The lung
volumes are low. There is a mosaic appearance of the lung fields
bilaterally with scattered areas of ground-glass opacification.
There are more focal opacities at the lung bases favored to
represent atelectasis. There is interlobular septal thickening.
There is no pneumothorax.

Upper Abdomen: No acute abnormality.

Musculoskeletal: No chest wall abnormality. No acute or significant
osseous findings.

Review of the MIP images confirms the above findings.
IMPRESSION: 1. Overall findings concerning for developing pulmonary edema. An
atypical infectious process is not entirely excluded and should be
correlated clinically.
2. Small bilateral pleural effusions.
3. Mediastinal and hilar adenopathy, presumably reactive in
etiology.
4. Low lung volumes.
5. Dilated main pulmonary artery which can be seen in patients with
elevated PA pressures.

Aortic Atherosclerosis ([id number]-R11.1).

## 2020-01-25 ENCOUNTER — Encounter: Payer: Self-pay | Admitting: Hospice and Palliative Medicine

## 2020-01-25 ENCOUNTER — Other Ambulatory Visit: Payer: Self-pay | Admitting: Cardiovascular Disease

## 2020-01-25 ENCOUNTER — Other Ambulatory Visit: Payer: Self-pay

## 2020-01-25 ENCOUNTER — Telehealth: Payer: Self-pay

## 2020-01-25 ENCOUNTER — Ambulatory Visit (INDEPENDENT_AMBULATORY_CARE_PROVIDER_SITE_OTHER): Payer: 59 | Admitting: Hospice and Palliative Medicine

## 2020-01-25 ENCOUNTER — Ambulatory Visit (HOSPITAL_COMMUNITY): Payer: Dental | Admitting: Dentistry

## 2020-01-25 VITALS — BP 158/79 | HR 98 | Temp 98.4°F | Resp 16 | Ht 64.0 in | Wt 238.0 lb

## 2020-01-25 DIAGNOSIS — M264 Malocclusion, unspecified: Secondary | ICD-10-CM

## 2020-01-25 DIAGNOSIS — T888XXA Other specified complications of surgical and medical care, not elsewhere classified, initial encounter: Secondary | ICD-10-CM

## 2020-01-25 DIAGNOSIS — R059 Cough, unspecified: Secondary | ICD-10-CM | POA: Diagnosis not present

## 2020-01-25 DIAGNOSIS — K083 Retained dental root: Secondary | ICD-10-CM

## 2020-01-25 DIAGNOSIS — K0889 Other specified disorders of teeth and supporting structures: Secondary | ICD-10-CM

## 2020-01-25 DIAGNOSIS — K045 Chronic apical periodontitis: Secondary | ICD-10-CM | POA: Diagnosis not present

## 2020-01-25 DIAGNOSIS — K011 Impacted teeth: Secondary | ICD-10-CM

## 2020-01-25 DIAGNOSIS — J849 Interstitial pulmonary disease, unspecified: Secondary | ICD-10-CM

## 2020-01-25 DIAGNOSIS — K036 Deposits [accretions] on teeth: Secondary | ICD-10-CM

## 2020-01-25 DIAGNOSIS — M2632 Excessive spacing of fully erupted teeth: Secondary | ICD-10-CM

## 2020-01-25 DIAGNOSIS — M2620 Unspecified anomaly of dental arch relationship: Secondary | ICD-10-CM

## 2020-01-25 DIAGNOSIS — K08409 Partial loss of teeth, unspecified cause, unspecified class: Secondary | ICD-10-CM

## 2020-01-25 DIAGNOSIS — K0530 Chronic periodontitis, unspecified: Secondary | ICD-10-CM

## 2020-01-25 DIAGNOSIS — K029 Dental caries, unspecified: Secondary | ICD-10-CM | POA: Diagnosis not present

## 2020-01-25 MED ORDER — PREDNISONE 10 MG PO TABS: ORAL_TABLET | ORAL | 0 refills | Status: DC

## 2020-01-25 MED ORDER — ALBUTEROL SULFATE HFA 108 (90 BASE) MCG/ACT IN AERS: 2.0000 | INHALATION_SPRAY | Freq: Four times a day (QID) | RESPIRATORY_TRACT | 2 refills | Status: DC | PRN

## 2020-01-25 MED ORDER — LEVOFLOXACIN 750 MG PO TABS: 750.0000 mg | ORAL_TABLET | Freq: Every day | ORAL | 0 refills | Status: DC

## 2020-01-25 NOTE — Telephone Encounter (Signed)
As per Leslie [name] advised that we send albuterol,levaquin and prednisone  And also advised [name] that buy pulse ox  To check her Oxygen and advised her if her oxygen is below 90 and having difficulty breathing need to go to ED

## 2020-01-25 NOTE — Patient Instructions (Signed)
[facility] Dr. Debe Coder B. Tad Fancher Phone: [phone] Fax: [phone]    It was a pleasure seeing you today!  Please refer to the information below regarding your dental visit, and call us should you have any questions or concerns that may come up after you leave.    We are planning to schedule you for the operating room to have your teeth taken out and all other necessary dental treatment before your heart surgery.  You will need to see your heart doctor once more before we see you in the operating room to make sure nothing has changed and your history and physical is up to date.  I will be in contact with them and they should be giving you a call to schedule an appointment.    After we schedule your dental surgery date, we will also need to hold your Coumadin and likely bridge with Lovenox.  I will talk with your heart doctor and the pharmacy team about this plan once we have scheduled you, so there is nothing you need to do about this now.     HEART VALVES AND MOUTH CARE  FACTS:   If you have any infection in your mouth, it can infect your heart valve.  If you heart valve is infected, you will be seriously ill.  Infections in the mouth can be SILENT and do not always cause pain.  Examples of infections in the mouth are gum disease, dental cavities, and abscesses.  Some possible signs of infection are: Bad breath, bleeding gums, or teeth that are sensitive to sweets, hot, and/or cold. There are many other signs as well.  WHAT YOU HAVE TO DO:   Brush your teeth after meals and at bedtime. Spend at least 2 minutes brushing well, especially behind your back teeth and all around your teeth that stand alone. Brush at the gumline also.  Do not go to bed without brushing your teeth and flossing.  If your gums bleed when you brush or floss, do NOT stop brushing or flossing.  Bleeding can be a sign of inflammation or irritation from bacteria.  It  usually means that your gums need more attention and better cleaning.   If your Dentist or Dr. Benson Norway gave you a prescription mouthwash to use, make sure to use it as directed. If you run out of the medication, get a refill at the pharmacy.  If you were given any other medications or directions by your Dentist, please follow them. If you did not understand the directions or forget what you were told, please call. We will be happy to refresh your memory.  If you need antibiotics before dental procedures, make sure you take them one hour prior to every dental visit as directed.   Get a dental check-up every 4-6 months in order to keep your mouth healthy, or to find and treat any new infection. You will most likely need your teeth cleaned or gums treated at the same time.  If you are not able to come in for your scheduled appointment, call your Dentist as soon as possible to reschedule.  If you have a problem in between dental visits, call your Dentist.   Thank you for giving [place] the opportunity to provide care for you.  If there is anything we can do for you, please let us know.   WE ARE A TEAM - OUR GOAL IS:  HEALTHY MOUTH, HEALTHY HEART

## 2020-01-25 NOTE — Progress Notes (Signed)
DENTAL VISIT OUTPATIENT CONSULTATION  Service Date:   01/25/2020 Referring Provider:                    Patient Name:   Leslie [name] Date of Birth:   1965/09/30 Medical Record Number: [id number]    PLAN & RECOMMENDATIONS   >> The patient does have periodontal disease and several teeth that need to be extracted. >> Recommend extractions of all indicated teeth prior to heart surgery to decrease the risk of perioperative and postoperative systemic infection and complications.    >> We can schedule the patient for the operating room to complete all extractions and dental treatment once patient has an updated H&P appointment with her medical provider.  We will also need to plan on holding Coumadin and bridging with Lovenox pending medical team's recommendations.     >> Recommend the patient establish care at a dental office of her choice for routine dental care including replacement of missing teeth, treatment for periodontal disease including deep cleanings and periodontal maintenance visits and exams. >> Discussed in detail all treatment options with the patient and she is agreeable to the plan.   Thank you for consulting with [facility] and for the opportunity to participate in this patient's treatment.  Should you have any questions or concerns, please contact the [facility] at [phone].     Consult Note:  01/25/2020    COVID 19 SCREENING: The patient denies symptoms concerning for COVID-19 infection including fever, chills, cough, or newly developed shortness of breath.   HPI: Leslie [name] is a very pleasant 55 y.o. female with h/o CHF, CKD, A-fib, unstable angina, AKI, GERD, anxiety, HTN, s/p total knee replacement and Mitral Valve stenosis anticipating heart valve surgery who presents today for a dental evaluation as part of their Pre-Heart Valve surgical work-up.  Patient was seen in September of 2021 for a dental consultation as well by Dr. Enrique Sack who had  referred her to an oral surgeon for extractions, however the patient has since changed her mind and would like for her treatment to be completed by [place].  I also preferred to meet the patient prior to scheduling her to make sure her treatment plan had not changed and to address any questions or concerns she may have. Dental History: Patient reports no changes to her dental history since September.  She does not have a dentist in mind to see following her heart surgery.  She currently denies any dental/oral pain or sensitivity. Patient able to manage oral secretions.  Patient denies dysphagia, odynophagia, dysphonia, SOB and neck pain.  Patient denies fever, rigors and malaise.   CHIEF COMPLAINT: "I am trying to get everything moving so I can schedule my heart surgery."   Patient Active Problem List   Diagnosis Date Noted  . Tightness in chest 01/02/2020  . Encounter for long-term (current) use of high-risk medication 01/02/2020  . BMI 39.0-39.9,adult 09/30/2019  . COVID-19 virus infection 09/09/2019  . Unstable angina ([place]) 09/06/2019  . 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 Encounter for general adult medical examination with abnormal findings 02/21/2019  . Oral candidiasis 02/21/2019  . Acute kidney failure, unspecified ([place]) 12/09/2018  . Right knee pain 11/27/2018  . GERD (gastroesophageal reflux disease) 10/25/2018  . CKD (chronic kidney disease), stage III ([place]) 10/25/2018  . CHF (congestive heart failure) ([place]) 10/25/2018  . Pyelonephritis 10/01/2018  . Absolute anemia 10/01/2018  . Iron deficiency anemia secondary to blood loss (chronic)   .  Polyp of descending colon   . Acute on chronic heart failure with preserved ejection fraction (HFpEF) ([place])   . AKI (acute kidney injury) ([place])   . Sepsis ([place]) 08/26/2018  . (HFpEF) heart failure with preserved ejection fraction ([place])   . Renal calculus, right 07/03/2018  . Low back pain 06/27/2018  . Bacterial vaginitis 06/27/2018   . Pedal edema 06/27/2018  . Coronary artery disease, non-occlusive 04/10/2018  . Acute on chronic diastolic CHF (congestive heart failure) ([place]) 04/10/2018  . Encounter for other preprocedural examination 03/16/2018  . Community acquired pneumonia 02/16/2018  . Elevated brain natriuretic peptide (BNP) level 02/16/2018  . Abnormal renal function 02/16/2018  . Acute recurrent pansinusitis 01/13/2018  . Candidiasis 01/13/2018  . Anxiety 01/13/2018  . Osteoarthritis of knee 01/09/2018  . Morbid obesity with body mass index (BMI) of 40.0 or higher ([place]) 11/13/2017  . Major depressive disorder 09/22/2017  . Mitral valve stenosis   . Atrial fibrillation ([place]) 09/11/2017  . Generalized abdominal pain 09/08/2017  . Episode of recurrent major depressive disorder ([place]) 09/08/2017  . Hypertension 06/06/2017  . Cough 06/06/2017  . Tobacco dependency 06/06/2017  . Urinary tract infection with hematuria 04/28/2017  . Acute upper respiratory infection 04/28/2017  . Flu-like symptoms 04/28/2017  . Dysuria 04/28/2017  . Vaginal candidiasis 04/28/2017  . Pain of left lower extremity 02/11/2016  . 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 Fibrocystic breast disease 03/23/2013  . Family history of breast cancer 07/20/2012   Past Medical History:  Diagnosis Date  . (HFpEF) heart failure with preserved ejection fraction ([place])    a. 08/2017 Echo: EF 55-60%.  Grade 2 diastolic dysfunction.  Moderate mitral stenosis.  . Allergy   . Anemia   . Anxiety   . BRCA negative 03/22/2013  . Bronchitis 02/19/2016   ON LEVAQUIN PO  . Chest tightness   . Cigarette smoker 09/11/2017   8-10 day  . Dyspnea   . GERD (gastroesophageal reflux disease)   . History of kidney stones   . Hyperlipidemia   . Hypertension   . Interstitial lung disease ([place])    a. CT 2013 b. 02/2018 CXR noted recurrent intersistial changes ILD vs chronic bronchitis  . Moderate mitral stenosis    a.  08/2017 TEE: EF 60 to  65%.  Moderate mitral stenosis.  Mean gradient 14 mmHg.  Valve area 2.59 cm by planimetry, 2.72 cm by pressure half-time.  . Persistent atrial fibrillation ([place])    a. 08/2017 s/p TEE/DCCV; b. CHA2DS2VASc = 2-->warfarin.   Past Surgical History:  Procedure Laterality Date  . ABDOMINAL HYSTERECTOMY     total  . ABDOMINAL HYSTERECTOMY    . BREAST BIOPSY Bilateral 2012  . BREAST BIOPSY Right 08-07-12   fibroadenomatous changes and columnar cells  . BREAST BIOPSY  02/03/2015   stereo byrnett  . CARDIAC CATHETERIZATION    . CHOLECYSTECTOMY N/A 02/27/2016   Procedure: LAPAROSCOPIC CHOLECYSTECTOMY;  Surgeon: Christene Lye, MD;  Location: [facility] ORS;  Service: General;  Laterality: N/A;  . COLONOSCOPY WITH PROPOFOL N/A 09/26/2018   Procedure: COLONOSCOPY WITH PROPOFOL;  Surgeon: Lucilla Lame, MD;  Location: [facility] ENDOSCOPY;  Service: Endoscopy;  Laterality: N/A;  . CYSTOSCOPY W/ RETROGRADES Right 08/18/2018   Procedure: CYSTOSCOPY WITH RETROGRADE PYELOGRAM;  Surgeon: Billey Co, MD;  Location: [facility] ORS;  Service: Urology;  Laterality: Right;  . CYSTOSCOPY/URETEROSCOPY/HOLMIUM LASER/STENT PLACEMENT Right 08/18/2018   Procedure: CYSTOSCOPY/URETEROSCOPY/STENT PLACEMENT;  Surgeon: Billey Co, MD;  Location: [facility] ORS;  Service: Urology;  Laterality: Right;  . ESOPHAGOGASTRODUODENOSCOPY (EGD) WITH PROPOFOL N/A 09/26/2018   Procedure: ESOPHAGOGASTRODUODENOSCOPY (EGD) WITH PROPOFOL;  Surgeon: Lucilla Lame, MD;  Location: [facility] ENDOSCOPY;  Service: Endoscopy;  Laterality: N/A;  . GIVENS CAPSULE STUDY N/A 11/03/2018   Procedure: GIVENS CAPSULE STUDY;  Surgeon: Lucilla Lame, MD;  Location: [facility] ENDOSCOPY;  Service: Endoscopy;  Laterality: N/A;  . JOINT REPLACEMENT Left    knee  . KNEE ARTHROPLASTY Right 04/09/2019   Procedure: COMPUTER ASSISTED TOTAL KNEE ARTHROPLASTY;  Surgeon: Dereck Leep, MD;  Location: [facility] ORS;  Service: Orthopedics;  Laterality: Right;  . KNEE CLOSED REDUCTION Left  04/15/2015   Procedure: CLOSED MANIPULATION KNEE;  Surgeon: Thornton Park, MD;  Location: [facility] ORS;  Service: Orthopedics;  Laterality: Left;  . KNEE SURGERY    . RIGHT/LEFT HEART CATH AND CORONARY ANGIOGRAPHY Bilateral 09/06/2019   Procedure: RIGHT/LEFT HEART CATH AND CORONARY ANGIOGRAPHY;  Surgeon: Minna Merritts, MD;  Location: [facility];  Service: Cardiovascular;  Laterality: Bilateral;  . TEE WITHOUT CARDIOVERSION N/A 09/13/2017   Procedure: TRANSESOPHAGEAL ECHOCARDIOGRAM (TEE);  Surgeon: Nelva Bush, MD;  Location: [facility];  Service: Cardiovascular;  Laterality: N/A;  . TOTAL KNEE ARTHROPLASTY Left 12/25/2014   Procedure: TOTAL KNEE ARTHROPLASTY;  Surgeon: Thornton Park, MD;  Location: [facility] ORS;  Service: Orthopedics;  Laterality: Left;  . TUBAL LIGATION     Allergies  Allergen Reactions  . Morphine Hives and Rash  . Oxycodone Hcl Hives and Itching  . Dilaudid [Hydromorphone Hcl] Itching   Current Outpatient Medications  Medication Sig Dispense Refill  . acetaminophen (TYLENOL) 500 MG tablet Take 1,000 mg by mouth every 6 (six) hours as needed for moderate pain or fever.    . ALPRAZolam (XANAX) 0.5 MG tablet Take 1 tablet (0.5 mg total) by mouth at bedtime as needed for anxiety. 30 tablet 2  . amoxicillin-clavulanate (AUGMENTIN) 875-125 MG tablet Take 1 tablet by mouth 2 (two) times daily. 14 tablet 0  . atorvastatin (LIPITOR) 80 MG tablet TAKE 1 TABLET BY MOUTH EVERY DAY AT 6 PM 90 tablet 1  . calcium carbonate (TUMS - DOSED IN MG ELEMENTAL CALCIUM) 500 MG chewable tablet Chew 1 tablet by mouth daily as needed for indigestion or heartburn.     . chlorpheniramine-HYDROcodone (TUSSIONEX PENNKINETIC ER) 10-8 MG/5ML SUER Take 5 mLs by mouth every 12 (twelve) hours as needed for cough. 115 mL 0  . diltiazem (CARDIZEM CD) 120 MG 24 hr capsule TAKE 1 CAPSULE(120 MG) BY MOUTH DAILY 90 capsule 0  . fluconazole (DIFLUCAN) 150 MG tablet Take 1 tablet po once. May repeat dose  in 3 days as needed for persistent symptoms. 3 tablet 0  . fluticasone (FLONASE) 50 MCG/ACT nasal spray Place 1 spray into both nostrils daily as needed for allergies or rhinitis.    . metoprolol succinate (TOPROL-XL) 50 MG 24 hr tablet TAKE 1 TABLET BY MOUTH EVERY MORNING AND 2 TABLETS BY MOUTH EVERY NIGHT AT BEDTIME 270 tablet 1  . metroNIDAZOLE (METROGEL) 0.75 % vaginal gel Place 1 Applicatorful vaginally at bedtime. 70 g 0  . omeprazole (PRILOSEC) 40 MG capsule Take 1 capsule (40 mg total) by mouth daily. 90 capsule 1  . potassium chloride (KLOR-CON) 10 MEQ tablet TAKE 4 TABLETS(40 MEQ) BY MOUTH DAILY 360 tablet 0  . torsemide (DEMADEX) 20 MG tablet Take 2 tablets (40 mg total) by mouth 2 (two) times daily. 360 tablet 3  . warfarin (COUMADIN) 2.5 MG tablet TAKE AS DIRECTED BY  [facility] 90 tablet 0   No current facility-administered medications for this visit.    LABS: Lab Results  Component Value Date   WBC 6.2 12/31/2019   HGB 11.9 12/31/2019   HCT 34.4 12/31/2019   MCV 86 12/31/2019   PLT 383 12/31/2019      Component Value Date/Time   NA 141 12/31/2019 1102   NA 138 05/08/2014 1310   K 3.7 12/31/2019 1102   K 3.4 (L) 05/08/2014 1310   CL 101 12/31/2019 1102   CL 99 (L) 05/08/2014 1310   CO2 26 12/31/2019 1102   CO2 29 05/08/2014 1310   GLUCOSE 98 12/31/2019 1102   GLUCOSE 115 (H) 09/05/2019 1638   GLUCOSE 100 (H) 05/08/2014 1310   BUN 16 12/31/2019 1102   BUN 14 05/08/2014 1310   CREATININE 1.18 (H) 12/31/2019 1102   CREATININE 1.08 (H) 05/08/2014 1310   CALCIUM 9.1 12/31/2019 1102   CALCIUM 9.2 05/08/2014 1310   GFRNONAA 53 (L) 12/31/2019 1102   GFRNONAA >60 05/08/2014 1310   GFRAA 61 12/31/2019 1102   GFRAA >60 05/08/2014 1310   Lab Results  Component Value Date   INR 2.3 12/19/2019   INR 3.1 (A) 11/07/2019   INR 2.0 09/26/2019   No results found for: PTT  Social History   Socioeconomic History  . Marital status: Married    Spouse name: Not on  file  . Number of children: Not on file  . Years of education: Not on file  . Highest education level: Not on file  Occupational History  . Not on file  Tobacco Use  . Smoking status: Former Smoker    Packs/day: 0.50    Years: 20.00    Pack years: 10.00    Types: Cigarettes    Quit date: 02/20/2018    Years since quitting: 1.9  . Smokeless tobacco: Never Used  Vaping Use  . Vaping Use: Never used  Substance and Sexual Activity  . Alcohol use: Yes    Comment: rarely  . Drug use: No  . Sexual activity: Not on file  Other Topics Concern  . Not on file  Social History Narrative  . Not on file   Social Determinants of Health   Financial Resource Strain: Not on file  Food Insecurity: Not on file  Transportation Needs: Not on file  Physical Activity: Not on file  Stress: Not on file  Social Connections: Not on file  Intimate Partner Violence: Not on file   Family History  Problem Relation Age of Onset  . Cancer Mother 43       breast  . Hypertension Mother   . Cancer Maternal Aunt        breast  . Cancer Maternal Grandmother        breast  . Osteoarthritis Father   . Hypertension Father      REVIEW OF SYSTEMS: Reviewed with the patient as per HPI. PSYCH: (++) Dental phobia   VITAL SIGNS: BP (!) 154/79 (BP Location: Right Arm)   Pulse 76   Temp 98.4 F (36.9 C)    PHYSICAL EXAMINATION: GENERAL: Well-developed, comfortable and in no apparent distress. NEUROLOGICAL: Alert and oriented to person, place and time. EXTRAORAL:  Facial symmetry present without any edema or erythema.  No swelling or lymphadenopathy. INTRAORAL: Soft tissues appear well-perfused and mucous membranes moist.  FOM and vestibules soft and not raised. Oral cavity without mass or lesion. No signs of infection, parulis, sinus tract, edema or erythema  evident upon exam.  DENTAL EXAMINATION: DENTITION: Overall fair remaining dentition.  Missing teeth, moderately restored dentition, multiple  diastemas.  Retained root tips #4 and #5. #2 supra-erupted. PERIODONTAL: Inflamed, erythematous gingival tissue.  Tooth mobility, Radiographic bone loss evident.  Heavy sub- and supra-gingival calculus accumulation, generalized. Risk factors for periodontal disease include obesity and (+) h/o smoking. DENTAL CARIES/DEFECTIVE RESTORATIONS: Multiple areas of dental caries charted on tooth chart. PROSTHODONTIC: Patient denies wearing partial dentures. OCCLUSION: Anterior open bite, bilateral anterior and posterior crossbite.  Class III malocclusion.  RADIOGRAPHIC EXAMINATION PAN Full Mouth Series exposed on 10/02/2019 and interpreted today: >> Condyles seated bilaterally in fossas.  No evidence of abnormal pathology.  All visualized osseous structures appear WNL. >> Generalized mild to moderate horizontal bone loss consistent with mild to moderate periodontitis. Significant radiographic calculus accumulation evident. #18 and #31 mesial and distal vertical bone loss. Missing teeth, caries, existing restorations.  Impacted tooth #11. #2 supra-erupted. #4 and #5 retained root tips with periapical radiolucencies. #18 deep decay approximating the pulp. #19 previous extraction site appears to be healing WNL.   ASSESSMENT 1. Pre-Heart Valve Surgery 2. Severe Mitral Stenosis 3. CHF, A-fib, Unstable angina, HTN 4. CKD 5. GERD 6. Anxiety 7. S/p Total Knee Replacement 8. Missing teeth 9. Dental caries 10. Retained dental roots 11. Chronic apical periodontitis 12. Chronic periodontitis 13. Loose teeth 14. Accretions on teeth 15. Diastemas 16. Impacted tooth #11 17. 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 Postoperative bleeding risk   PLAN/RECOMMENDATIONS  . I discussed the risks, benefits, and complications of various treatment options with the patient in relationship to her anticipated heart valve surgery and dental conditions.  Discussed  risks/complications such as endocarditis, systemic infection, and chronic inflammation related to her chronic periodontal disease.  Encouraged patient to find an outside dental provider of her choice to start seeing after she is optimized medically to address her periodontal disease and chronic inflammation of her gums.  Explained the importance of optimizing her oral health after her heart surgery to ensure that her surgery is successful and to decrease her risk of heart valve infection or inflammation and complications if she does not address her periodontal disease and other cavities.   . We discussed various treatment options to include no treatment, multiple extractions with alveoloplasty, pre-prosthetic surgery as indicated, periodontal therapy, dental restorations, root canal therapy, crown and bridge therapy, implant therapy, and replacement of missing teeth as indicated.  . The patient verbalized understanding of all options, and currently wishes to proceed with Extractions of indicated teeth with alveoloplasty as needed, and debridement of any teeth not needing a deep cleaning while in the OR under [place].  This is indicated due to the patient's current medical status and severe dental phobia.  . Discussion of findings with medical team and coordination of future medical and dental care as needed.  She will need an updated History and Physical appointment prior to posting her for the OR, and once this is completed we will need to discuss holding her Coumadin and potentially bridging with Lovenox pending medical team's recommendations.   The patient tolerated today's visit well.  All questions and concerns were addressed and the patient departed in stable condition.   I spent in excess of 60 minutes during the conduct of this consultation and >50% of this time involved direct face-to-face encounter for counseling and/or coordination of the patient's care.   Belhaven Benson [place], DMD

## 2020-01-25 NOTE — Progress Notes (Signed)
[facility] [place], [place] [address]  Internal MEDICINE  Telephone Visit  Patient Name: Leslie [name]  [id number]  [id number]  Date of Service: 01/25/2020  I connected with the patient at 1241 by telephone and verified the patients identity using two identifiers.   I discussed the limitations, risks, security and privacy concerns of performing an evaluation and management service by telephone and the availability of in person appointments. I also discussed with the patient that there may be a patient responsible charge related to the service.  The patient expressed understanding and agrees to proceed.    Chief Complaint  Patient presents with  . Acute Visit    Neg. Covid test, coughing bad, sinuses draining, sob, throat sore, 102 fever a few days ago, achy, body sore, headache, started over a week ago,   . Telephone Assessment    [phone]  . Telephone Screen    Video call please send her a link  . Gastroesophageal Reflux  . Hyperlipidemia  . Hypertension  . Anxiety  . Anemia    HPI Patient is being seen via virtual visit for acute sick visit C/o chest congestion, sinus pain and pressure, shortness of breath, sore throat, body aches, headache and febrile Symptoms have been ongoing for 1 week and are not getting any better  PCR testing COVID negative Personal history of COVID in July--was not hospitalized at that time She was put on Augmentin 12/30 but symptoms continue to worsen  History of ILD, not currently on inhaler therapy  Current Medication: Outpatient Encounter Medications as of 01/25/2020  Medication Sig  . albuterol (VENTOLIN HFA) 108 (90 Base) MCG/ACT inhaler Inhale 2 puffs into the lungs every 6 (six) hours as needed for wheezing or shortness of breath.  . levofloxacin (LEVAQUIN) 750 MG tablet Take 1 tablet (750 mg total) by mouth daily.  . predniSONE (DELTASONE) 10 MG tablet Take 1 tablet three times a day with a meal for three for  three days, take 1 tablet by twice daily with a meal for 3 days, take 1 tablet once daily with a meal for 3 days  . acetaminophen (TYLENOL) 500 MG tablet Take 1,000 mg by mouth every 6 (six) hours as needed for moderate pain or fever.  . ALPRAZolam (XANAX) 0.5 MG tablet Take 1 tablet (0.5 mg total) by mouth at bedtime as needed for anxiety.  Marland Kitchen amoxicillin-clavulanate (AUGMENTIN) 875-125 MG tablet Take 1 tablet by mouth 2 (two) times daily.  Marland Kitchen atorvastatin (LIPITOR) 80 MG tablet TAKE 1 TABLET BY MOUTH EVERY DAY AT 6 PM  . calcium carbonate (TUMS - DOSED IN MG ELEMENTAL CALCIUM) 500 MG chewable tablet Chew 1 tablet by mouth daily as needed for indigestion or heartburn.   . chlorpheniramine-HYDROcodone (TUSSIONEX PENNKINETIC ER) 10-8 MG/5ML SUER Take 5 mLs by mouth every 12 (twelve) hours as needed for cough.  . diltiazem (CARDIZEM CD) 120 MG 24 hr capsule TAKE 1 CAPSULE(120 MG) BY MOUTH DAILY  . fluconazole (DIFLUCAN) 150 MG tablet Take 1 tablet po once. May repeat dose in 3 days as needed for persistent symptoms.  . fluticasone (FLONASE) 50 MCG/ACT nasal spray Place 1 spray into both nostrils daily as needed for allergies or rhinitis.  . metoprolol succinate (TOPROL-XL) 50 MG 24 hr tablet TAKE 1 TABLET BY MOUTH EVERY MORNING AND 2 TABLETS BY MOUTH EVERY NIGHT AT BEDTIME  . metroNIDAZOLE (METROGEL) 0.75 % vaginal gel Place 1 Applicatorful vaginally at bedtime.  Marland Kitchen omeprazole (PRILOSEC) 40 MG capsule  Take 1 capsule (40 mg total) by mouth daily.  . potassium chloride (KLOR-CON) 10 MEQ tablet TAKE 4 TABLETS(40 MEQ) BY MOUTH DAILY  . torsemide (DEMADEX) 20 MG tablet Take 2 tablets (40 mg total) by mouth 2 (two) times daily.  Marland Kitchen warfarin (COUMADIN) 2.5 MG tablet TAKE AS DIRECTED BY ANTI-COAG CLINIC   No facility-administered encounter medications on file as of 01/25/2020.    Surgical History: Past Surgical History:  Procedure Laterality Date  . ABDOMINAL HYSTERECTOMY     total  . ABDOMINAL HYSTERECTOMY     . BREAST BIOPSY Bilateral 2012  . BREAST BIOPSY Right 08-07-12   fibroadenomatous changes and columnar cells  . BREAST BIOPSY  02/03/2015   stereo byrnett  . CARDIAC CATHETERIZATION    . CHOLECYSTECTOMY N/A 02/27/2016   Procedure: LAPAROSCOPIC CHOLECYSTECTOMY;  Surgeon: Christene Lye, MD;  Location: [facility] OR[facility];  Service: General;  Laterality: N/A;  . COLONOSCOPY WITH PROPOFOL N/A 09/26/2018   Procedure: COLONOSCOPY WITH PROPOFOL;  Surgeon: Lucilla Lame, MD;  Location: [facility] ENDOSCOPY;  Service: Endoscopy;  Laterality: N/A;  . CYSTOSCOPY W/ RETROGRADES Right 08/18/2018   Procedure: CYSTOSCOPY WITH RETROGRADE PYELOGRAM;  Surgeon: Billey Co, MD;  Location: [facility];  Service: Urology;  Laterality: Right;  . CYSTOSCOPY/URETEROSCOPY/HOLMIUM LASER/STENT PLACEMENT Right 08/18/2018   Procedure: CYSTOSCOPY/URETEROSCOPY/STENT PLACEMENT;  Surgeon: Billey Co, MD;  Location: [facility];  Service: Urology;  Laterality: Right;  . ESOPHAGOGASTRODUODENOSCOPY (EGD) WITH PROPOFOL N/A 09/26/2018   Procedure: ESOPHAGOGASTRODUODENOSCOPY (EGD) WITH PROPOFOL;  Surgeon: Lucilla Lame, MD;  Location: [facility];  Service: Endoscopy;  Laterality: N/A;  . GIVENS CAPSULE STUDY N/A 11/03/2018   Procedure: GIVENS CAPSULE STUDY;  Surgeon: Lucilla Lame, MD;  Location: [facility] ENDOSCOPY;  Service: Endoscopy;  Laterality: N/A;  . JOINT REPLACEMENT Left    knee  . KNEE ARTHROPLASTY Right 04/09/2019   Procedure: COMPUTER ASSISTED TOTAL KNEE ARTHROPLASTY;  Surgeon: Dereck Leep, MD;  Location: [facility] OR[facility];  Service: Orthopedics;  Laterality: Right;  . KNEE CLOSED REDUCTION Left 04/15/2015   Procedure: CLOSED MANIPULATION KNEE;  Surgeon: Thornton Park, MD;  Location: [facility];  Service: Orthopedics;  Laterality: Left;  . KNEE SURGERY    . RIGHT/LEFT HEART CATH AND CORONARY ANGIOGRAPHY Bilateral 09/06/2019   Procedure: RIGHT/LEFT HEART CATH AND CORONARY ANGIOGRAPHY;  Surgeon: Minna Merritts, MD;  Location: [place] [facility];  Service: Cardiovascular;  Laterality: Bilateral;  . TEE WITHOUT CARDIOVERSION N/A 09/13/2017   Procedure: TRANSESOPHAGEAL ECHOCARDIOGRAM (TEE);  Surgeon: Nelva Bush, MD;  Location: [facility];  Service: Cardiovascular;  Laterality: N/A;  . TOTAL KNEE ARTHROPLASTY Left 12/25/2014   Procedure: TOTAL KNEE ARTHROPLASTY;  Surgeon: Thornton Park, MD;  Location: [facility];  Service: Orthopedics;  Laterality: Left;  . TUBAL LIGATION      Medical History: Past Medical History:  Diagnosis Date  . (HFpEF) heart failure with preserved ejection fraction ([place])    a. 08/2017 Echo: EF 55-60%.  Grade 2 diastolic dysfunction.  Moderate mitral stenosis.  . Allergy   . Anemia   . Anxiety   . BRCA negative 03/22/2013  . Bronchitis 02/19/2016   ON LEVAQUIN PO  . Chest tightness   . Cigarette smoker 09/11/2017   8-10 day  . Dyspnea   . GERD (gastroesophageal reflux disease)   . History of kidney stones   . Hyperlipidemia   . Hypertension   . Interstitial lung disease ([place])    a. CT 2013 b. 02/2018 CXR noted recurrent intersistial changes ILD vs chronic bronchitis  .  Moderate mitral stenosis    a.  08/2017 TEE: EF 60 to 65%.  Moderate mitral stenosis.  Mean gradient 14 mmHg.  Valve area 2.59 cm by planimetry, 2.72 cm by pressure half-time.  . Persistent atrial fibrillation ([place])    a. 08/2017 s/p TEE/DCCV; b. CHA2DS2VASc = 2-->warfarin.    Family History: Family History  Problem Relation Age of Onset  . Cancer Mother 37       breast  . Hypertension Mother   . Cancer Maternal Aunt        breast  . Cancer Maternal Grandmother        breast  . Osteoarthritis Father   . Hypertension Father     Social History   Socioeconomic History  . Marital status: Married    Spouse name: Not on file  . Number of children: Not on file  . Years of education: Not on file  . Highest education level: Not on file  Occupational History  . Not on file  Tobacco Use  . Smoking status:  Former Smoker    Packs/day: 0.50    Years: 20.00    Pack years: 10.00    Types: Cigarettes    Quit date: 02/20/2018    Years since quitting: 1.9  . Smokeless tobacco: Never Used  Vaping Use  . Vaping Use: Never used  Substance and Sexual Activity  . Alcohol use: Yes    Comment: rarely  . Drug use: No  . Sexual activity: Not on file  Other Topics Concern  . Not on file  Social History Narrative  . Not on file   Social Determinants of Health   Financial Resource Strain: Not on file  Food Insecurity: Not on file  Transportation Needs: Not on file  Physical Activity: Not on file  Stress: Not on file  Social Connections: Not on file  Intimate Partner Violence: Not on file   Review of Systems  Constitutional: Positive for fatigue and fever. Negative for chills and diaphoresis.  HENT: Positive for congestion. Negative for ear pain, postnasal drip and sinus pressure.   Eyes: Negative for photophobia, discharge, redness, itching and visual disturbance.  Respiratory: Positive for cough and shortness of breath. Negative for wheezing.   Cardiovascular: Negative for chest pain, palpitations and leg swelling.  Gastrointestinal: Negative for abdominal pain, constipation, diarrhea, nausea and vomiting.  Genitourinary: Negative for dysuria and flank pain.  Musculoskeletal: Negative for arthralgias, back pain, gait problem and neck pain.  Skin: Negative for color change.  Allergic/Immunologic: Negative for environmental allergies and food allergies.  Neurological: Positive for headaches. Negative for dizziness.  Hematological: Does not bruise/bleed easily.  Psychiatric/Behavioral: Negative for agitation, behavioral problems (depression) and hallucinations.     Vital Signs: BP (!) 158/79   Pulse 98   Temp 98.4 F (36.9 C)   Resp 16   Ht '5\' 4"'  (1.626 m)   Wt 238 lb (108 kg)   BMI 40.85 kg/m    Observation/Objective: Acute respiratory distress not identified, congestion and  coughing noted  Assessment/Plan: 1. ILD (interstitial lung disease) ([place]) Stop current antibiotic and start Levaquin for acute exacerbation Advised to monitor SpO2 levels and if they drop below 90% advised to go to hospital - levofloxacin (LEVAQUIN) 750 MG tablet; Take 1 tablet (750 mg total) by mouth daily.  Dispense: 10 tablet; Refill: 0  2. Cough Albuterol as needed for shortness of breath and wheezing, short course low dose prednisone taper for symptom relief - albuterol (VENTOLIN HFA) 108 (90  Base) MCG/ACT inhaler; Inhale 2 puffs into the lungs every 6 (six) hours as needed for wheezing or shortness of breath.  Dispense: 8 g; Refill: 2 - predniSONE (DELTASONE) 10 MG tablet; Take 1 tablet three times a day with a meal for three for three days, take 1 tablet by twice daily with a meal for 3 days, take 1 tablet once daily with a meal for 3 days  Dispense: 18 tablet; Refill: 0  General Counseling: Chloie verbalizes understanding of the findings of today's phone visit and agrees with plan of treatment. I have discussed any further diagnostic evaluation that may be needed or ordered today. We also reviewed her medications today. she has been encouraged to call the office with any questions or concerns that should arise related to todays visit.   Meds ordered this encounter  Medications  . levofloxacin (LEVAQUIN) 750 MG tablet    Sig: Take 1 tablet (750 mg total) by mouth daily.    Dispense:  10 tablet    Refill:  0  . albuterol (VENTOLIN HFA) 108 (90 Base) MCG/ACT inhaler    Sig: Inhale 2 puffs into the lungs every 6 (six) hours as needed for wheezing or shortness of breath.    Dispense:  8 g    Refill:  2  . predniSONE (DELTASONE) 10 MG tablet    Sig: Take 1 tablet three times a day with a meal for three for three days, take 1 tablet by twice daily with a meal for 3 days, take 1 tablet once daily with a meal for 3 days    Dispense:  18 tablet    Refill:  0     Time spent: 30  Minutes Time spent includes review of chart, medications, test results and follow-up plan with the patient.  Tanna Furry Jeffrie Lofstrom AGNP-C Internal medicine

## 2020-01-27 NOTE — Progress Notes (Signed)
She needs a urgent visit per the dental surgeon before January 13 just needs a new updated H&P on the chart reports she can have her teeth removed January 13 in the operating room is there a spot someone can see her Monday Tuesday or Wednesday?

## 2020-01-28 ENCOUNTER — Other Ambulatory Visit: Payer: Self-pay | Admitting: Hospice and Palliative Medicine

## 2020-01-28 ENCOUNTER — Telehealth: Payer: Self-pay | Admitting: Cardiovascular Disease

## 2020-01-28 ENCOUNTER — Telehealth: Payer: Self-pay

## 2020-01-28 NOTE — Telephone Encounter (Signed)
As per taylor pt advised she can take delsym for cough and also she can used albuterol for 4 hrs as needed

## 2020-01-28 NOTE — Telephone Encounter (Signed)
  Patient Consent for Virtual Visit         Leslie [name] has provided verbal consent on 01/28/2020 for a virtual visit (video or telephone).   CONSENT FOR VIRTUAL VISIT FOR:  Leslie [name]  By participating in this virtual visit I agree to the following:  I hereby voluntarily request, consent and authorize [facility] and its employed or contracted physicians, Engineer, materials, nurse practitioners or other licensed health care professionals (the Practitioner), to provide me with telemedicine health care services (the "Services") as deemed necessary by the treating Practitioner. I acknowledge and consent to receive the Services by the Practitioner via telemedicine. I understand that the telemedicine visit will involve communicating with the Practitioner through live audiovisual communication technology and the disclosure of certain medical information by electronic transmission. I acknowledge that I have been given the opportunity to request an in-person assessment or other available alternative prior to the telemedicine visit and am voluntarily participating in the telemedicine visit.  I understand that I have the right to withhold or withdraw my consent to the use of telemedicine in the course of my care at any time, without affecting my right to future care or treatment, and that the Practitioner or I may terminate the telemedicine visit at any time. I understand that I have the right to inspect all information obtained and/or recorded in the course of the telemedicine visit and may receive copies of available information for a reasonable fee.  I understand that some of the potential risks of receiving the Services via telemedicine include:  Marland Kitchen Delay or interruption in medical evaluation due to technological equipment failure or disruption; . Information transmitted may not be sufficient (e.g. poor resolution of images) to allow for appropriate medical decision making by the Practitioner;  and/or  . In rare instances, security protocols could fail, causing a breach of personal health information.  Furthermore, I acknowledge that it is my responsibility to provide information about my medical history, conditions and care that is complete and accurate to the best of my ability. I acknowledge that Practitioner's advice, recommendations, and/or decision may be based on factors not within their control, such as incomplete or inaccurate data provided by me or distortions of diagnostic images or specimens that may result from electronic transmissions. I understand that the practice of medicine is not an exact science and that Practitioner makes no warranties or guarantees regarding treatment outcomes. I acknowledge that a copy of this consent can be made available to me via my patient portal ([facility]), or I can request a printed copy by calling the office of [facility].    I understand that my insurance will be billed for this visit.   I have read or had this consent read to me. . I understand the contents of this consent, which adequately explains the benefits and risks of the Services being provided via telemedicine.  . I have been provided ample opportunity to ask questions regarding this consent and the Services and have had my questions answered to my satisfaction. . I give my informed consent for the services to be provided through the use of telemedicine in my medical care

## 2020-01-30 ENCOUNTER — Telehealth (INDEPENDENT_AMBULATORY_CARE_PROVIDER_SITE_OTHER): Payer: 59 | Admitting: Cardiology

## 2020-01-30 ENCOUNTER — Encounter: Payer: Self-pay | Admitting: Cardiology

## 2020-01-30 VITALS — BP 144/72 | HR 70 | Ht 64.0 in | Wt 238.0 lb

## 2020-01-30 DIAGNOSIS — Z7901 Long term (current) use of anticoagulants: Secondary | ICD-10-CM | POA: Diagnosis not present

## 2020-01-30 DIAGNOSIS — J441 Chronic obstructive pulmonary disease with (acute) exacerbation: Secondary | ICD-10-CM

## 2020-01-30 DIAGNOSIS — I050 Rheumatic mitral stenosis: Secondary | ICD-10-CM | POA: Diagnosis not present

## 2020-01-30 DIAGNOSIS — Z01818 Encounter for other preprocedural examination: Secondary | ICD-10-CM | POA: Diagnosis not present

## 2020-01-30 NOTE — Patient Instructions (Signed)
Medication Instructions:  Continue current medications  *If you need a refill on your cardiac medications before your next appointment, please call your pharmacy*   Lab Work: None Ordered   Testing/Procedures: None Ordered   Follow-Up: At [facility], you and your health needs are our priority.  As part of our continuing mission to provide you with exceptional heart care, we have created designated Provider Care Teams.  These Care Teams include your primary Cardiologist (physician) and Advanced Practice Providers (APPs -  Physician Assistants and Nurse Practitioners) who all work together to provide you with the care you need, when you need it.  We recommend signing up for the patient portal called "MyChart".  Sign up information is provided on this After Visit Summary.  MyChart is used to connect with patients for Virtual Visits (Telemedicine).  Patients are able to view lab/test results, encounter notes, upcoming appointments, etc.  Non-urgent messages can be sent to your provider as well.   To learn more about what you can do with MyChart, go to [email or web address].    Your next appointment:   3 month(s)  The format for your next appointment:   In Person  Provider:   You may see Ida Rogue, MD or one of the following Advanced Practice Providers on your designated Care Team:    Murray Hodgkins, NP  Christell Faith, PA-C  Marrianne Mood, PA-C  Cadence Rosenhayn, [place]  Laurann [place], NP

## 2020-01-30 NOTE — Progress Notes (Signed)
Virtual Visit via Telephone Note   This visit type was conducted due to national recommendations for restrictions regarding the COVID-19 Pandemic (e.g. social distancing) in an effort to limit this patient's exposure and mitigate transmission in our community.  Due to her co-morbid illnesses, this patient is at least at moderate risk for complications without adequate follow up.  This format is felt to be most appropriate for this patient at this time.  The patient did not have access to video technology/had technical difficulties with video requiring transitioning to audio format only (telephone).  All issues noted in this document were discussed and addressed.  No physical exam could be performed with this format.  Please refer to the patient's chart for her  consent to telehealth for [facility].    Date:  01/30/2020   ID:  Leslie [name], DOB 1965/05/20, MRN [id number] The patient was identified using 2 identifiers.  Patient Location: Home Provider Location: Home Office  PCP:  Ronnell Freshwater, NP  Cardiologist:  Ida Rogue, MD  Electrophysiologist:  None   Evaluation Performed:  Pre op clearance   Chief Complaint:  cough  History of Present Illness:    Leslie [name] is a 55 y.o. female with a history of severe mitral stenosis, hypertension, tobacco abuse and ongoing COPD exacerbation, PAF, chronic warfarin anticoagulation, and morbid obesity with a BMI of 40.  The patient has been evaluated by Dr. Roxy Manns and has had a coronary angiogram.  She has no significant coronary disease.  She has moderate pulmonary arterial hypertension.  She has been maintaining normal sinus rhythm.  The plan is for mitral valve surgery and maze procedure.  In preparation for this she has been to her dentist and needs for teeth extracted.  She was told by the dentist office to contact us for clearance.  In the interim she is also had a COPD exacerbation.  She tells me she tested COVID-negative.  She was put  on antibiotics and inhalers and feels like she is making some improvement.  She is to follow-up with her primary care provider in a week.  She denies any palpitations or tachycardia.  The patient does not have symptoms concerning for COVID-19 infection (fever, chills, cough, or new shortness of breath).    Past Medical History:  Diagnosis Date  . (HFpEF) heart failure with preserved ejection fraction ([place])    a. 08/2017 Echo: EF 55-60%.  Grade 2 diastolic dysfunction.  Moderate mitral stenosis.  . Allergy   . Anemia   . Anxiety   . BRCA negative 03/22/2013  . Bronchitis 02/19/2016   ON LEVAQUIN PO  . Chest tightness   . Cigarette smoker 09/11/2017   8-10 day  . Dyspnea   . GERD (gastroesophageal reflux disease)   . History of kidney stones   . Hyperlipidemia   . Hypertension   . Interstitial lung disease ([place])    a. CT 2013 b. 02/2018 CXR noted recurrent intersistial changes ILD vs chronic bronchitis  . Moderate mitral stenosis    a.  08/2017 TEE: EF 60 to 65%.  Moderate mitral stenosis.  Mean gradient 14 mmHg.  Valve area 2.59 cm by planimetry, 2.72 cm by pressure half-time.  . Persistent atrial fibrillation ([place])    a. 08/2017 s/p TEE/DCCV; b. CHA2DS2VASc = 2-->warfarin.   Past Surgical History:  Procedure Laterality Date  . ABDOMINAL HYSTERECTOMY     total  . ABDOMINAL HYSTERECTOMY    . BREAST BIOPSY Bilateral 2012  . BREAST BIOPSY  Right 08-07-12   fibroadenomatous changes and columnar cells  . BREAST BIOPSY  02/03/2015   stereo byrnett  . CARDIAC CATHETERIZATION    . CHOLECYSTECTOMY N/A 02/27/2016   Procedure: LAPAROSCOPIC CHOLECYSTECTOMY;  Surgeon: Seeplaputhur G Sankar, MD;  Location: [facility] OR[facility];  Service: General;  Laterality: N/A;  . COLONOSCOPY WITH PROPOFOL N/A 09/26/2018   Procedure: COLONOSCOPY WITH PROPOFOL;  Surgeon: Wohl, Darren, MD;  Location: [facility];  Service: Endoscopy;  Laterality: N/A;  . CYSTOSCOPY W/ RETROGRADES Right 08/18/2018   Procedure: CYSTOSCOPY  WITH RETROGRADE PYELOGRAM;  Surgeon: Sninsky, Brian C, MD;  Location: [facility];  Service: Urology;  Laterality: Right;  . CYSTOSCOPY/URETEROSCOPY/HOLMIUM LASER/STENT PLACEMENT Right 08/18/2018   Procedure: CYSTOSCOPY/URETEROSCOPY/STENT PLACEMENT;  Surgeon: Sninsky, Brian C, MD;  Location: [facility];  Service: Urology;  Laterality: Right;  . ESOPHAGOGASTRODUODENOSCOPY (EGD) WITH PROPOFOL N/A 09/26/2018   Procedure: ESOPHAGOGASTRODUODENOSCOPY (EGD) WITH PROPOFOL;  Surgeon: Wohl, Darren, MD;  Location: [facility];  Service: Endoscopy;  Laterality: N/A;  . GIVENS CAPSULE STUDY N/A 11/03/2018   Procedure: GIVENS CAPSULE STUDY;  Surgeon: Wohl, Darren, MD;  Location: [facility];  Service: Endoscopy;  Laterality: N/A;  . JOINT REPLACEMENT Left    knee  . KNEE ARTHROPLASTY Right 04/09/2019   Procedure: COMPUTER ASSISTED TOTAL KNEE ARTHROPLASTY;  Surgeon: Hooten, James P, MD;  Location: [facility];  Service: Orthopedics;  Laterality: Right;  . KNEE CLOSED REDUCTION Left 04/15/2015   Procedure: CLOSED MANIPULATION KNEE;  Surgeon: Kevin Krasinski, MD;  Location: [facility];  Service: Orthopedics;  Laterality: Left;  . KNEE SURGERY    . RIGHT/LEFT HEART CATH AND CORONARY ANGIOGRAPHY Bilateral 09/06/2019   Procedure: RIGHT/LEFT HEART CATH AND CORONARY ANGIOGRAPHY;  Surgeon: Gollan, Timothy J, MD;  Location: [facility];  Service: Cardiovascular;  Laterality: Bilateral;  . TEE WITHOUT CARDIOVERSION N/A 09/13/2017   Procedure: TRANSESOPHAGEAL ECHOCARDIOGRAM (TEE);  Surgeon: End, Christopher, MD;  Location: [facility];  Service: Cardiovascular;  Laterality: N/A;  . TOTAL KNEE ARTHROPLASTY Left 12/25/2014   Procedure: TOTAL KNEE ARTHROPLASTY;  Surgeon: Kevin Krasinski, MD;  Location: [facility];  Service: Orthopedics;  Laterality: Left;  . TUBAL LIGATION       Current Meds  Medication Sig  . acetaminophen (TYLENOL) 500 MG tablet Take 1,000 mg by mouth every 6 (six) hours as needed for moderate pain or fever.   . albuterol (VENTOLIN HFA) 108 (90 Base) MCG/ACT inhaler Inhale 2 puffs into the lungs every 6 (six) hours as needed for wheezing or shortness of breath.  . ALPRAZolam (XANAX) 0.5 MG tablet Take 1 tablet (0.5 mg total) by mouth at bedtime as needed for anxiety.  . atorvastatin (LIPITOR) 80 MG tablet TAKE 1 TABLET BY MOUTH EVERY DAY AT 6 PM  . calcium carbonate (TUMS - DOSED IN MG ELEMENTAL CALCIUM) 500 MG chewable tablet Chew 1 tablet by mouth daily as needed for indigestion or heartburn.   . chlorpheniramine-HYDROcodone (TUSSIONEX PENNKINETIC ER) 10-8 MG/5ML SUER Take 5 mLs by mouth every 12 (twelve) hours as needed for cough.  . diltiazem (CARDIZEM CD) 120 MG 24 hr capsule TAKE 1 CAPSULE(120 MG) BY MOUTH DAILY  . fluticasone (FLONASE) 50 MCG/ACT nasal spray Place 1 spray into both nostrils daily as needed for allergies or rhinitis.  . metoprolol succinate (TOPROL-XL) 50 MG 24 hr tablet TAKE 1 TABLET BY MOUTH EVERY MORNING AND 2 TABLETS BY MOUTH EVERY NIGHT AT BEDTIME  . omeprazole (PRILOSEC) 40 MG capsule Take 1 capsule (40 mg total) by mouth daily.  . potassium   chloride (KLOR-CON) 10 MEQ tablet TAKE 4 TABLETS(40 MEQ) BY MOUTH DAILY  . predniSONE (DELTASONE) 10 MG tablet Take 1 tablet three times a day with a meal for three for three days, take 1 tablet by twice daily with a meal for 3 days, take 1 tablet once daily with a meal for 3 days  . torsemide (DEMADEX) 20 MG tablet Take 2 tablets (40 mg total) by mouth 2 (two) times daily.  Marland Kitchen warfarin (COUMADIN) 2.5 MG tablet TAKE AS DIRECTED BY ANTI-COAG CLINIC     Allergies:   Morphine, Oxycodone hcl, and Dilaudid [hydromorphone hcl]   Social History   Tobacco Use  . Smoking status: Former Smoker    Packs/day: 0.50    Years: 20.00    Pack years: 10.00    Types: Cigarettes    Quit date: 02/20/2018    Years since quitting: 1.9  . Smokeless tobacco: Never Used  Vaping Use  . Vaping Use: Never used  Substance Use Topics  . Alcohol use: Yes     Comment: rarely  . Drug use: No     Family Hx: The patient's family history includes Cancer in her maternal aunt and maternal grandmother; Cancer (age of onset: 38) in her mother; Hypertension in her father and mother; Osteoarthritis in her father.  ROS:   Please see the history of present illness.    All other systems reviewed and are negative.   Prior CV studies:   The following studies were reviewed today:  Echo 06/27/2019- IMPRESSIONS  1. Left ventricular ejection fraction, by estimation, is 65 to 70%. The  left ventricle has normal function. The left ventricle has no regional  wall motion abnormalities. Left ventricular diastolic parameters are  consistent with Grade II diastolic  dysfunction (pseudonormalization).  2. Right ventricular systolic function is normal. The right ventricular  size is normal. There is moderately elevated pulmonary artery systolic  pressure.  3. Left atrial size was moderately dilated.  4. Mitral valve mean gradient is 71mHg at heart rate 66bpm. The mitral  valve is degenerative. Mild mitral valve regurgitation. Severe mitral  stenosis.  5. The aortic valve is normal in structure. Aortic valve regurgitation is  not visualized.  6. The inferior vena cava is normal in size with <50% respiratory  variability, suggesting right atrial pressure of 8 mmHg.   Rt and Lt heart cath 09/06/2019- Final Conclusions:   No significant coronary artery disease Moderately elevated right heart pressures consistent with moderate pulmonary hypertension Recent echocardiogram with severe mitral valve stenosis  Recommendations:  We will recommend referral to CT surgery for consideration of mitral valve stenosis evaluation, valve replacement Scheduled to have a Zio monitor given continued paroxysmal tachycardia despite high doses of metoprolol She is on high doses of torsemide with worsening renal dysfunction and still with markedly elevated right heart pressures  consistent with worsening mitral valve stenosis   Labs/Other Tests and Data Reviewed:    EKG:  An ECG dated 01/04/2020 was personally reviewed today and demonstrated:  NSR, HR 67  Recent Labs: 03/30/2019: ALT 22 12/31/2019: BUN 16; Creatinine, Ser 1.18; Hemoglobin 11.9; Platelets 383; Potassium 3.7; Sodium 141   Recent Lipid Panel Lab Results  Component Value Date/Time   CHOL 171 09/19/2018 09:32 AM   CHOL 170 04/07/2018 09:10 AM   TRIG 186 (H) 09/19/2018 09:32 AM   HDL 33 (L) 09/19/2018 09:32 AM   HDL 34 (L) 04/07/2018 09:10 AM   CHOLHDL 5.2 09/19/2018 09:32 AM   LDLCALC 101 (  H) 09/19/2018 09:32 AM   LDLCALC 109 (H) 04/07/2018 09:10 AM    Wt Readings from Last 3 Encounters:  01/30/20 238 lb (108 kg)  01/25/20 238 lb (108 kg)  12/31/19 240 lb (108.9 kg)     Risk Assessment/Calculations:     CHA2DS2-VASc Score =   2 This indicates a  % annual risk of stroke. The patient's score is based upon: sex, CHF.        Objective:    Vital Signs:  BP (!) 144/72   Pulse 70   Ht 5' 4" (1.626 m)   Wt 238 lb (108 kg)   BMI 40.85 kg/m    VITAL SIGNS:  reviewed  ASSESSMENT & PLAN:    Pre op clearance for dental extraction- I presume this procedure will be done local and not under general.  The patient is currently being treated for a COPD exacerbation.  I would recommend waiting until she is recovered from this.  I have asked her to contact the dental office to send us a formal request for preop clearance so it can be routed through our pharmacist for recommendation on SBE prophylaxis and warfarin therapy.  When she is stable from a pulmonary standpoint she can proceed with her dental extraction.  Severe MS- The plan is for mitral valve surgery and maze procedure once her dental extraction is complete.  PAF- She is holding sinus rhythm by her history.  Chronic anticoagulation- Followed by Coumadin clinic, INR next week.   COPD exacerbation- This is being treated by her  primary care provider, she has a follow-up next week.  Morbid obesity- BMI 40  Plan: As above.  The patient will follow-up with her primary care provider next week and get an INR next week.  Preop clearance for dental extraction of 4 teeth can be provided once we receive a formal request and pharmacy has weighed in.          COVID-19 Education: The signs and symptoms of COVID-19 were discussed with the patient and how to seek care for testing (follow up with PCP or arrange E-visit).  The importance of social distancing was discussed today.  Time:   Today, I have spent 20 minutes with the patient with telehealth technology discussing the above problems.     Medication Adjustments/Labs and Tests Ordered: Current medicines are reviewed at length with the patient today.  Concerns regarding medicines are outlined above.   Tests Ordered: No orders of the defined types were placed in this encounter.   Medication Changes: No orders of the defined types were placed in this encounter.   Follow Up:  In Person Dr Gollan 3 months  Signed, Luke Kilroy, PA-C  01/30/2020 11:31 AM    [facility] 

## 2020-01-30 NOTE — H&P (View-Only) (Signed)
 Virtual Visit via Telephone Note   This visit type was conducted due to national recommendations for restrictions regarding the COVID-19 Pandemic (e.g. social distancing) in an effort to limit this patient's exposure and mitigate transmission in our community.  Due to her co-morbid illnesses, this patient is at least at moderate risk for complications without adequate follow up.  This format is felt to be most appropriate for this patient at this time.  The patient did not have access to video technology/had technical difficulties with video requiring transitioning to audio format only (telephone).  All issues noted in this document were discussed and addressed.  No physical exam could be performed with this format.  Please refer to the patient's chart for her  consent to telehealth for [facility].    Date:  01/30/2020   ID:  Leslie [name], DOB 09/10/1965, MRN [id number] The patient was identified using 2 identifiers.  Patient Location: Home Provider Location: Home Office  PCP:  Boscia, Heather E, NP  Cardiologist:  Timothy Gollan, MD  Electrophysiologist:  None   Evaluation Performed:  Pre op clearance   Chief Complaint:  cough  History of Present Illness:    Leslie [name] is a 54 y.o. female with a history of severe mitral stenosis, hypertension, tobacco abuse and ongoing COPD exacerbation, PAF, chronic warfarin anticoagulation, and morbid obesity with a BMI of 40.  The patient has been evaluated by Dr. Owen and has had a coronary angiogram.  She has no significant coronary disease.  She has moderate pulmonary arterial hypertension.  She has been maintaining normal sinus rhythm.  The plan is for mitral valve surgery and maze procedure.  In preparation for this she has been to her dentist and needs for teeth extracted.  She was told by the dentist office to contact us for clearance.  In the interim she is also had a COPD exacerbation.  She tells me she tested COVID-negative.  She was put  on antibiotics and inhalers and feels like she is making some improvement.  She is to follow-up with her primary care provider in a week.  She denies any palpitations or tachycardia.  The patient does not have symptoms concerning for COVID-19 infection (fever, chills, cough, or new shortness of breath).    Past Medical History:  Diagnosis Date  . (HFpEF) heart failure with preserved ejection fraction (HCC)    a. 08/2017 Echo: EF 55-60%.  Grade 2 diastolic dysfunction.  Moderate mitral stenosis.  . Allergy   . Anemia   . Anxiety   . BRCA negative 03/22/2013  . Bronchitis 02/19/2016   ON LEVAQUIN PO  . Chest tightness   . Cigarette smoker 09/11/2017   8-10 day  . Dyspnea   . GERD (gastroesophageal reflux disease)   . History of kidney stones   . Hyperlipidemia   . Hypertension   . Interstitial lung disease (HCC)    a. CT 2013 b. 02/2018 CXR noted recurrent intersistial changes ILD vs chronic bronchitis  . Moderate mitral stenosis    a.  08/2017 TEE: EF 60 to 65%.  Moderate mitral stenosis.  Mean gradient 14 mmHg.  Valve area 2.59 cm by planimetry, 2.72 cm by pressure half-time.  . Persistent atrial fibrillation (HCC)    a. 08/2017 s/p TEE/DCCV; b. CHA2DS2VASc = 2-->warfarin.   Past Surgical History:  Procedure Laterality Date  . ABDOMINAL HYSTERECTOMY     total  . ABDOMINAL HYSTERECTOMY    . BREAST BIOPSY Bilateral 2012  . BREAST BIOPSY   Right 08-07-12   fibroadenomatous changes and columnar cells  . BREAST BIOPSY  02/03/2015   stereo byrnett  . CARDIAC CATHETERIZATION    . CHOLECYSTECTOMY N/A 02/27/2016   Procedure: LAPAROSCOPIC CHOLECYSTECTOMY;  Surgeon: Seeplaputhur G Sankar, MD;  Location: [facility];  Service: General;  Laterality: N/A;  . COLONOSCOPY WITH PROPOFOL N/A 09/26/2018   Procedure: COLONOSCOPY WITH PROPOFOL;  Surgeon: Wohl, Darren, MD;  Location: [facility];  Service: Endoscopy;  Laterality: N/A;  . CYSTOSCOPY W/ RETROGRADES Right 08/18/2018   Procedure: CYSTOSCOPY  WITH RETROGRADE PYELOGRAM;  Surgeon: Sninsky, Brian C, MD;  Location: [facility];  Service: Urology;  Laterality: Right;  . CYSTOSCOPY/URETEROSCOPY/HOLMIUM LASER/STENT PLACEMENT Right 08/18/2018   Procedure: CYSTOSCOPY/URETEROSCOPY/STENT PLACEMENT;  Surgeon: Sninsky, Brian C, MD;  Location: [facility];  Service: Urology;  Laterality: Right;  . ESOPHAGOGASTRODUODENOSCOPY (EGD) WITH PROPOFOL N/A 09/26/2018   Procedure: ESOPHAGOGASTRODUODENOSCOPY (EGD) WITH PROPOFOL;  Surgeon: Wohl, Darren, MD;  Location: [facility];  Service: Endoscopy;  Laterality: N/A;  . GIVENS CAPSULE STUDY N/A 11/03/2018   Procedure: GIVENS CAPSULE STUDY;  Surgeon: Wohl, Darren, MD;  Location: [facility];  Service: Endoscopy;  Laterality: N/A;  . JOINT REPLACEMENT Left    knee  . KNEE ARTHROPLASTY Right 04/09/2019   Procedure: COMPUTER ASSISTED TOTAL KNEE ARTHROPLASTY;  Surgeon: Hooten, James P, MD;  Location: [facility];  Service: Orthopedics;  Laterality: Right;  . KNEE CLOSED REDUCTION Left 04/15/2015   Procedure: CLOSED MANIPULATION KNEE;  Surgeon: Kevin Krasinski, MD;  Location: [facility];  Service: Orthopedics;  Laterality: Left;  . KNEE SURGERY    . RIGHT/LEFT HEART CATH AND CORONARY ANGIOGRAPHY Bilateral 09/06/2019   Procedure: RIGHT/LEFT HEART CATH AND CORONARY ANGIOGRAPHY;  Surgeon: Gollan, Timothy J, MD;  Location: [facility];  Service: Cardiovascular;  Laterality: Bilateral;  . TEE WITHOUT CARDIOVERSION N/A 09/13/2017   Procedure: TRANSESOPHAGEAL ECHOCARDIOGRAM (TEE);  Surgeon: End, Christopher, MD;  Location: [facility];  Service: Cardiovascular;  Laterality: N/A;  . TOTAL KNEE ARTHROPLASTY Left 12/25/2014   Procedure: TOTAL KNEE ARTHROPLASTY;  Surgeon: Kevin Krasinski, MD;  Location: [facility];  Service: Orthopedics;  Laterality: Left;  . TUBAL LIGATION       Current Meds  Medication Sig  . acetaminophen (TYLENOL) 500 MG tablet Take 1,000 mg by mouth every 6 (six) hours as needed for moderate pain or fever.   . albuterol (VENTOLIN HFA) 108 (90 Base) MCG/ACT inhaler Inhale 2 puffs into the lungs every 6 (six) hours as needed for wheezing or shortness of breath.  . ALPRAZolam (XANAX) 0.5 MG tablet Take 1 tablet (0.5 mg total) by mouth at bedtime as needed for anxiety.  . atorvastatin (LIPITOR) 80 MG tablet TAKE 1 TABLET BY MOUTH EVERY DAY AT 6 PM  . calcium carbonate (TUMS - DOSED IN MG ELEMENTAL CALCIUM) 500 MG chewable tablet Chew 1 tablet by mouth daily as needed for indigestion or heartburn.   . chlorpheniramine-HYDROcodone (TUSSIONEX PENNKINETIC ER) 10-8 MG/5ML SUER Take 5 mLs by mouth every 12 (twelve) hours as needed for cough.  . diltiazem (CARDIZEM CD) 120 MG 24 hr capsule TAKE 1 CAPSULE(120 MG) BY MOUTH DAILY  . fluticasone (FLONASE) 50 MCG/ACT nasal spray Place 1 spray into both nostrils daily as needed for allergies or rhinitis.  . metoprolol succinate (TOPROL-XL) 50 MG 24 hr tablet TAKE 1 TABLET BY MOUTH EVERY MORNING AND 2 TABLETS BY MOUTH EVERY NIGHT AT BEDTIME  . omeprazole (PRILOSEC) 40 MG capsule Take 1 capsule (40 mg total) by mouth daily.  . potassium   chloride (KLOR-CON) 10 MEQ tablet TAKE 4 TABLETS(40 MEQ) BY MOUTH DAILY  . predniSONE (DELTASONE) 10 MG tablet Take 1 tablet three times a day with a meal for three for three days, take 1 tablet by twice daily with a meal for 3 days, take 1 tablet once daily with a meal for 3 days  . torsemide (DEMADEX) 20 MG tablet Take 2 tablets (40 mg total) by mouth 2 (two) times daily.  Marland Kitchen warfarin (COUMADIN) 2.5 MG tablet TAKE AS DIRECTED BY ANTI-COAG CLINIC     Allergies:   Morphine, Oxycodone hcl, and Dilaudid [hydromorphone hcl]   Social History   Tobacco Use  . Smoking status: Former Smoker    Packs/day: 0.50    Years: 20.00    Pack years: 10.00    Types: Cigarettes    Quit date: 02/20/2018    Years since quitting: 1.9  . Smokeless tobacco: Never Used  Vaping Use  . Vaping Use: Never used  Substance Use Topics  . Alcohol use: Yes     Comment: rarely  . Drug use: No     Family Hx: The patient's family history includes Cancer in her maternal aunt and maternal grandmother; Cancer (age of onset: 41) in her mother; Hypertension in her father and mother; Osteoarthritis in her father.  ROS:   Please see the history of present illness.    All other systems reviewed and are negative.   Prior CV studies:   The following studies were reviewed today:  Echo 06/27/2019- IMPRESSIONS  1. Left ventricular ejection fraction, by estimation, is 65 to 70%. The  left ventricle has normal function. The left ventricle has no regional  wall motion abnormalities. Left ventricular diastolic parameters are  consistent with Grade II diastolic  dysfunction (pseudonormalization).  2. Right ventricular systolic function is normal. The right ventricular  size is normal. There is moderately elevated pulmonary artery systolic  pressure.  3. Left atrial size was moderately dilated.  4. Mitral valve mean gradient is 35mHg at heart rate 66bpm. The mitral  valve is degenerative. Mild mitral valve regurgitation. Severe mitral  stenosis.  5. The aortic valve is normal in structure. Aortic valve regurgitation is  not visualized.  6. The inferior vena cava is normal in size with <50% respiratory  variability, suggesting right atrial pressure of 8 mmHg.   Rt and Lt heart cath 09/06/2019- Final Conclusions:   No significant coronary artery disease Moderately elevated right heart pressures consistent with moderate pulmonary hypertension Recent echocardiogram with severe mitral valve stenosis  Recommendations:  We will recommend referral to CT surgery for consideration of mitral valve stenosis evaluation, valve replacement Scheduled to have a Zio monitor given continued paroxysmal tachycardia despite high doses of metoprolol She is on high doses of torsemide with worsening renal dysfunction and still with markedly elevated right heart pressures  consistent with worsening mitral valve stenosis   Labs/Other Tests and Data Reviewed:    EKG:  An ECG dated 01/04/2020 was personally reviewed today and demonstrated:  NSR, HR 67  Recent Labs: 03/30/2019: ALT 22 12/31/2019: BUN 16; Creatinine, Ser 1.18; Hemoglobin 11.9; Platelets 383; Potassium 3.7; Sodium 141   Recent Lipid Panel Lab Results  Component Value Date/Time   CHOL 171 09/19/2018 09:32 AM   CHOL 170 04/07/2018 09:10 AM   TRIG 186 (H) 09/19/2018 09:32 AM   HDL 33 (L) 09/19/2018 09:32 AM   HDL 34 (L) 04/07/2018 09:10 AM   CHOLHDL 5.2 09/19/2018 09:32 AM   LDLCALC 101 (  H) 09/19/2018 09:32 AM   LDLCALC 109 (H) 04/07/2018 09:10 AM    Wt Readings from Last 3 Encounters:  01/30/20 238 lb (108 kg)  01/25/20 238 lb (108 kg)  12/31/19 240 lb (108.9 kg)     Risk Assessment/Calculations:     CHA2DS2-VASc Score =   2 This indicates a  % annual risk of stroke. The patient's score is based upon: sex, CHF.        Objective:    Vital Signs:  BP (!) 144/72   Pulse 70   Ht 5' 4" (1.626 m)   Wt 238 lb (108 kg)   BMI 40.85 kg/m    VITAL SIGNS:  reviewed  ASSESSMENT & PLAN:    Pre op clearance for dental extraction- I presume this procedure will be done local and not under general.  The patient is currently being treated for a COPD exacerbation.  I would recommend waiting until she is recovered from this.  I have asked her to contact the dental office to send us a formal request for preop clearance so it can be routed through our pharmacist for recommendation on SBE prophylaxis and warfarin therapy.  When she is stable from a pulmonary standpoint she can proceed with her dental extraction.  Severe MS- The plan is for mitral valve surgery and maze procedure once her dental extraction is complete.  PAF- She is holding sinus rhythm by her history.  Chronic anticoagulation- Followed by Coumadin clinic, INR next week.   COPD exacerbation- This is being treated by her  primary care provider, she has a follow-up next week.  Morbid obesity- BMI 40  Plan: As above.  The patient will follow-up with her primary care provider next week and get an INR next week.  Preop clearance for dental extraction of 4 teeth can be provided once we receive a formal request and pharmacy has weighed in.          COVID-19 Education: The signs and symptoms of COVID-19 were discussed with the patient and how to seek care for testing (follow up with PCP or arrange E-visit).  The importance of social distancing was discussed today.  Time:   Today, I have spent 20 minutes with the patient with telehealth technology discussing the above problems.     Medication Adjustments/Labs and Tests Ordered: Current medicines are reviewed at length with the patient today.  Concerns regarding medicines are outlined above.   Tests Ordered: No orders of the defined types were placed in this encounter.   Medication Changes: No orders of the defined types were placed in this encounter.   Follow Up:  In Person Dr Gollan 3 months  Signed, Tzipora Mcinroy, PA-C  01/30/2020 11:31 AM    [facility] 

## 2020-02-01 ENCOUNTER — Encounter: Payer: Self-pay | Admitting: Hospice and Palliative Medicine

## 2020-02-01 ENCOUNTER — Other Ambulatory Visit: Payer: Self-pay

## 2020-02-01 ENCOUNTER — Ambulatory Visit (INDEPENDENT_AMBULATORY_CARE_PROVIDER_SITE_OTHER): Payer: 59 | Admitting: Hospice and Palliative Medicine

## 2020-02-01 VITALS — BP 148/84 | HR 68 | Temp 97.2°F | Resp 16 | Ht 64.0 in | Wt 238.0 lb

## 2020-02-01 DIAGNOSIS — I480 Paroxysmal atrial fibrillation: Secondary | ICD-10-CM

## 2020-02-01 DIAGNOSIS — I050 Rheumatic mitral stenosis: Secondary | ICD-10-CM | POA: Diagnosis not present

## 2020-02-01 DIAGNOSIS — Z0001 Encounter for general adult medical examination with abnormal findings: Secondary | ICD-10-CM

## 2020-02-01 DIAGNOSIS — J849 Interstitial pulmonary disease, unspecified: Secondary | ICD-10-CM | POA: Diagnosis not present

## 2020-02-01 MED ORDER — DILTIAZEM HCL ER COATED BEADS 120 MG PO CP24: ORAL_CAPSULE | ORAL | 1 refills | Status: DC

## 2020-02-01 NOTE — Progress Notes (Signed)
[facility] [place], [place] [address]  Internal MEDICINE  Office Visit Note  Patient Name: Leslie [name]  [id number]  [id number]  Date of Service: 02/04/2020  Chief Complaint  Patient presents with  . Follow-up  . Anxiety  . Hypertension  . Hyperlipidemia    HPI Patient is here for routine follow-up Seen virtually two weeks ago for chest congestion and sinusitis--COVID testing negative History of ILD--has not had pulmonary assessment or work-up, not currently on any inhalers She does complain of shortness of breath with exertion, this has been ongoing for several years, she always contributed it to her smoking She quit smoking about 6 years ago  Followed by cardiology for mitral valve stenosis and A. Fib, mildly elevated right heart pressures--plans to have possible appendage clip,maze but has dental work that must be completed first   Current Medication: Outpatient Encounter Medications as of 02/01/2020  Medication Sig  . acetaminophen (TYLENOL) 500 MG tablet Take 1,000 mg by mouth every 6 (six) hours as needed for moderate pain or fever.  Marland Kitchen albuterol (VENTOLIN HFA) 108 (90 Base) MCG/ACT inhaler Inhale 2 puffs into the lungs every 6 (six) hours as needed for wheezing or shortness of breath.  . ALPRAZolam (XANAX) 0.5 MG tablet Take 1 tablet (0.5 mg total) by mouth at bedtime as needed for anxiety.  Marland Kitchen atorvastatin (LIPITOR) 80 MG tablet TAKE 1 TABLET BY MOUTH EVERY DAY AT 6 PM  . calcium carbonate (TUMS - DOSED IN MG ELEMENTAL CALCIUM) 500 MG chewable tablet Chew 1 tablet by mouth daily as needed for indigestion or heartburn.   . chlorpheniramine-HYDROcodone (TUSSIONEX PENNKINETIC ER) 10-8 MG/5ML SUER Take 5 mLs by mouth every 12 (twelve) hours as needed for cough.  . fluticasone (FLONASE) 50 MCG/ACT nasal spray Place 1 spray into both nostrils daily as needed for allergies or rhinitis.  . metoprolol succinate (TOPROL-XL) 50 MG 24 hr tablet TAKE 1 TABLET  BY MOUTH EVERY MORNING AND 2 TABLETS BY MOUTH EVERY NIGHT AT BEDTIME  . omeprazole (PRILOSEC) 40 MG capsule Take 1 capsule (40 mg total) by mouth daily.  . potassium chloride (KLOR-CON) 10 MEQ tablet TAKE 4 TABLETS(40 MEQ) BY MOUTH DAILY  . predniSONE (DELTASONE) 10 MG tablet Take 1 tablet three times a day with a meal for three for three days, take 1 tablet by twice daily with a meal for 3 days, take 1 tablet once daily with a meal for 3 days  . torsemide (DEMADEX) 20 MG tablet Take 2 tablets (40 mg total) by mouth 2 (two) times daily.  Marland Kitchen warfarin (COUMADIN) 2.5 MG tablet TAKE AS DIRECTED BY ANTI-COAG CLINIC  . [DISCONTINUED] diltiazem (CARDIZEM CD) 120 MG 24 hr capsule TAKE 1 CAPSULE(120 MG) BY MOUTH DAILY  . diltiazem (CARDIZEM CD) 120 MG 24 hr capsule TAKE 1 CAPSULE(120 MG) BY MOUTH DAILY   No facility-administered encounter medications on file as of 02/01/2020.    Surgical History: Past Surgical History:  Procedure Laterality Date  . ABDOMINAL HYSTERECTOMY     total  . ABDOMINAL HYSTERECTOMY    . BREAST BIOPSY Bilateral 2012  . BREAST BIOPSY Right 08-07-12   fibroadenomatous changes and columnar cells  . BREAST BIOPSY  02/03/2015   stereo byrnett  . CARDIAC CATHETERIZATION    . CHOLECYSTECTOMY N/A 02/27/2016   Procedure: LAPAROSCOPIC CHOLECYSTECTOMY;  Surgeon: Christene Lye, MD;  Location: [facility] ORS;  Service: General;  Laterality: N/A;  . COLONOSCOPY WITH PROPOFOL N/A 09/26/2018   Procedure: COLONOSCOPY WITH  PROPOFOL;  Surgeon: Lucilla Lame, MD;  Location: [facility] ENDOSCOPY;  Service: Endoscopy;  Laterality: N/A;  . CYSTOSCOPY W/ RETROGRADES Right 08/18/2018   Procedure: CYSTOSCOPY WITH RETROGRADE PYELOGRAM;  Surgeon: Billey Co, MD;  Location: [facility] ORS;  Service: Urology;  Laterality: Right;  . CYSTOSCOPY/URETEROSCOPY/HOLMIUM LASER/STENT PLACEMENT Right 08/18/2018   Procedure: CYSTOSCOPY/URETEROSCOPY/STENT PLACEMENT;  Surgeon: Billey Co, MD;  Location: [facility] ORS;   Service: Urology;  Laterality: Right;  . ESOPHAGOGASTRODUODENOSCOPY (EGD) WITH PROPOFOL N/A 09/26/2018   Procedure: ESOPHAGOGASTRODUODENOSCOPY (EGD) WITH PROPOFOL;  Surgeon: Lucilla Lame, MD;  Location: [facility] ENDOSCOPY;  Service: Endoscopy;  Laterality: N/A;  . GIVENS CAPSULE STUDY N/A 11/03/2018   Procedure: GIVENS CAPSULE STUDY;  Surgeon: Lucilla Lame, MD;  Location: [facility] ENDOSCOPY;  Service: Endoscopy;  Laterality: N/A;  . JOINT REPLACEMENT Left    knee  . KNEE ARTHROPLASTY Right 04/09/2019   Procedure: COMPUTER ASSISTED TOTAL KNEE ARTHROPLASTY;  Surgeon: Dereck Leep, MD;  Location: [facility] ORS;  Service: Orthopedics;  Laterality: Right;  . KNEE CLOSED REDUCTION Left 04/15/2015   Procedure: CLOSED MANIPULATION KNEE;  Surgeon: Thornton Park, MD;  Location: [facility] ORS;  Service: Orthopedics;  Laterality: Left;  . KNEE SURGERY    . RIGHT/LEFT HEART CATH AND CORONARY ANGIOGRAPHY Bilateral 09/06/2019   Procedure: RIGHT/LEFT HEART CATH AND CORONARY ANGIOGRAPHY;  Surgeon: Minna Merritts, MD;  Location: [facility];  Service: Cardiovascular;  Laterality: Bilateral;  . TEE WITHOUT CARDIOVERSION N/A 09/13/2017   Procedure: TRANSESOPHAGEAL ECHOCARDIOGRAM (TEE);  Surgeon: Nelva Bush, MD;  Location: [facility] ORS;  Service: Cardiovascular;  Laterality: N/A;  . TOTAL KNEE ARTHROPLASTY Left 12/25/2014   Procedure: TOTAL KNEE ARTHROPLASTY;  Surgeon: Thornton Park, MD;  Location: [facility] ORS;  Service: Orthopedics;  Laterality: Left;  . TUBAL LIGATION      Medical History: Past Medical History:  Diagnosis Date  . (HFpEF) heart failure with preserved ejection fraction ([place])    a. 08/2017 Echo: EF 55-60%.  Grade 2 diastolic dysfunction.  Moderate mitral stenosis.  . Allergy   . Anemia   . Anxiety   . BRCA negative 03/22/2013  . Bronchitis 02/19/2016   ON LEVAQUIN PO  . Chest tightness   . Cigarette smoker 09/11/2017   8-10 day  . Dyspnea   . GERD (gastroesophageal reflux disease)   . History  of kidney stones   . Hyperlipidemia   . Hypertension   . Interstitial lung disease ([place])    a. CT 2013 b. 02/2018 CXR noted recurrent intersistial changes ILD vs chronic bronchitis  . Moderate mitral stenosis    a.  08/2017 TEE: EF 60 to 65%.  Moderate mitral stenosis.  Mean gradient 14 mmHg.  Valve area 2.59 cm by planimetry, 2.72 cm by pressure half-time.  . Persistent atrial fibrillation ([place])    a. 08/2017 s/p TEE/DCCV; b. CHA2DS2VASc = 2-->warfarin.    Family History: Family History  Problem Relation Age of Onset  . Cancer Mother 71       breast  . Hypertension Mother   . Cancer Maternal Aunt        breast  . Cancer Maternal Grandmother        breast  . Osteoarthritis Father   . Hypertension Father     Social History   Socioeconomic History  . Marital status: Married    Spouse name: Not on file  . Number of children: Not on file  . Years of education: Not on file  . Highest education level: Not on file  Occupational History  . Not on file  Tobacco Use  . Smoking status: Former Smoker    Packs/day: 0.50    Years: 20.00    Pack years: 10.00    Types: Cigarettes    Quit date: 02/20/2018    Years since quitting: 1.9  . Smokeless tobacco: Never Used  Vaping Use  . Vaping Use: Never used  Substance and Sexual Activity  . Alcohol use: Yes    Comment: rarely  . Drug use: No  . Sexual activity: Not on file  Other Topics Concern  . Not on file  Social History Narrative  . Not on file   Social Determinants of Health   Financial Resource Strain: Not on file  Food Insecurity: Not on file  Transportation Needs: Not on file  Physical Activity: Not on file  Stress: Not on file  Social Connections: Not on file  Intimate Partner Violence: Not on file      Review of Systems  Constitutional: Negative for chills, diaphoresis and fatigue.  HENT: Negative for ear pain, postnasal drip and sinus pressure.   Eyes: Negative for photophobia, discharge, redness, itching  and visual disturbance.  Respiratory: Positive for shortness of breath. Negative for cough and wheezing.   Cardiovascular: Negative for chest pain, palpitations and leg swelling.  Gastrointestinal: Negative for abdominal pain, constipation, diarrhea, nausea and vomiting.  Genitourinary: Negative for dysuria and flank pain.  Musculoskeletal: Negative for arthralgias, back pain, gait problem and neck pain.  Skin: Negative for color change.  Allergic/Immunologic: Negative for environmental allergies and food allergies.  Neurological: Negative for dizziness and headaches.  Hematological: Does not bruise/bleed easily.  Psychiatric/Behavioral: Negative for agitation, behavioral problems (depression) and hallucinations.    Vital Signs: BP (!) 148/84   Pulse 68   Temp (!) 97.2 F (36.2 C)   Resp 16   Ht '5\' 4"'  (1.626 m)   Wt 238 lb (108 kg)   SpO2 98%   BMI 40.85 kg/m    Physical Exam Vitals reviewed.  Constitutional:      Appearance: Normal appearance. She is obese.  Cardiovascular:     Rate and Rhythm: Normal rate and regular rhythm.     Pulses: Normal pulses.     Heart sounds: Normal heart sounds.  Pulmonary:     Effort: Pulmonary effort is normal.     Breath sounds: Normal breath sounds.  Abdominal:     General: Abdomen is flat.     Palpations: Abdomen is soft.  Musculoskeletal:        General: Normal range of motion.     Cervical back: Normal range of motion.  Skin:    General: Skin is warm.  Neurological:     General: No focal deficit present.     Mental Status: She is alert and oriented to person, place, and time. Mental status is at baseline.  Psychiatric:        Mood and Affect: Mood normal.        Behavior: Behavior normal.        Thought Content: Thought content normal.        Judgment: Judgment normal.    Assessment/Plan: 1. ILD (interstitial lung disease) (HCC) PFT testing, symptoms of dyspnea on exertion, schedule to establish with pulmonary medicine to  discuss PFT results - Pulmonary function test; Future  2. Paroxysmal atrial fibrillation (HCC) Stable at this time, requesting refills, followed by cardiology - diltiazem (CARDIZEM CD) 120 MG 24 hr capsule; TAKE 1 CAPSULE(120 MG) BY  MOUTH DAILY  Dispense: 90 capsule; Refill: 1  3. Mitral valve stenosis, unspecified etiology Followed and managed by cardiology, planning to have surgery but in the works of having teeth removed prior to cardiac procedure  4. Encounter for routine adult health examination with abnormal findings Labs ordered for upcoming CPE in February - CBC w/Diff/Platelet - Comprehensive Metabolic Panel (CMET) - Lipid Panel With LDL/HDL Ratio - TSH + free T4  General Counseling: Nidhi verbalizes understanding of the findings of todays visit and agrees with plan of treatment. I have discussed any further diagnostic evaluation that may be needed or ordered today. We also reviewed her medications today. she has been encouraged to call the office with any questions or concerns that should arise related to todays visit.    Orders Placed This Encounter  Procedures  . CBC w/Diff/Platelet  . Comprehensive Metabolic Panel (CMET)  . Lipid Panel With LDL/HDL Ratio  . TSH + free T4  . Pulmonary function test    Meds ordered this encounter  Medications  . diltiazem (CARDIZEM CD) 120 MG 24 hr capsule    Sig: TAKE 1 CAPSULE(120 MG) BY MOUTH DAILY    Dispense:  90 capsule    Refill:  1    Time spent: 30 Minutes Time spent includes review of chart, medications, test results and follow-up plan with the patient.  This patient was seen by Theodoro Grist AGNP-C in Collaboration with Dr Lavera Guise as a part of collaborative care agreement     Tanna Furry. Sangita Zani AGNP-C Internal medicine

## 2020-02-04 ENCOUNTER — Encounter: Payer: Self-pay | Admitting: Hospice and Palliative Medicine

## 2020-02-12 ENCOUNTER — Ambulatory Visit: Payer: 59 | Admitting: Cardiology

## 2020-02-12 ENCOUNTER — Telehealth: Payer: Self-pay | Admitting: Cardiology

## 2020-02-12 NOTE — Telephone Encounter (Signed)
Patient with diagnosis of afib on warfarin for anticoagulation.    Procedure: 4 teeth extracted Date of procedure: TBD   CHA2DS2-VASc Score = 4  This indicates a 4.8% annual risk of stroke. The patient's score is based upon: CHF History: Yes HTN History: Yes Diabetes History: No Stroke History: No Vascular Disease History: Yes Age Score: 0 Gender Score: 1   CrCl 81mL/min using adjusted body weight. Platelet count 383K  Patient does not require pre-op antibiotics for dental procedure. Once she has her valve replacement however, she will require SBE prophylaxis for any future dental work.  Per office protocol, patient can hold warfarin for 5 days prior to procedure.  She does not require bridging.

## 2020-02-12 NOTE — Telephone Encounter (Signed)
I spoke with the patient on the phone today regarding her upcoming dental work.  I had spoken with her January 12 and she was having a COPD exacerbation and was on antibiotics and steroids, (the patient insists she does not have a diagnosis of COPD).  She only took a couple days of steroids and her symptoms have resolved.  From our standpoint she can be cleared for her dental extraction, she needs 4 teeth extracted.  I will discuss with our pharmacist SBE prophylaxis and holding her warfarin therapy pre op.  Unfortunately we were never sent an official preop clearance by the dentist office. I will relay our preop recommendations back to the surgeon's office.Marland KitchenKerin Ransom PA-C 02/12/2020 10:03 AM

## 2020-02-12 NOTE — Telephone Encounter (Addendum)
   Primary Cardiologist: Leslie Rogue, MD  Chart reviewed and patient contacted by phone today as part of pre-operative protocol coverage. When I spoke to the patient 01/30/2020 she was being treated for bronchitis. She tells me this has resolved. I discussed anticoagulation with our pharmacist and our recommendation is to hold warfarin 5 days pre op.  She will not need Lovenox crossover.  Resume Warfarin as soon as safe post op.    The patient was advised that if she develops new symptoms prior to surgery to contact our office to arrange for a follow-up visit, and she verbalized understanding.  SBE prophylaxis is not required for the patient from a cardiac standpoint.  I will route this recommendation to Dr Sandi Mariscal DMD via [place] fax function and remove from pre-op pool.  Please call with questions.  Kerin Ransom, PA-C 02/12/2020, 11:37 AM

## 2020-02-13 ENCOUNTER — Other Ambulatory Visit: Payer: Self-pay

## 2020-02-13 ENCOUNTER — Ambulatory Visit (INDEPENDENT_AMBULATORY_CARE_PROVIDER_SITE_OTHER): Payer: 59

## 2020-02-13 ENCOUNTER — Ambulatory Visit: Payer: 59 | Admitting: Internal Medicine

## 2020-02-13 DIAGNOSIS — I050 Rheumatic mitral stenosis: Secondary | ICD-10-CM

## 2020-02-13 DIAGNOSIS — Z5181 Encounter for therapeutic drug level monitoring: Secondary | ICD-10-CM | POA: Diagnosis not present

## 2020-02-13 DIAGNOSIS — I4891 Unspecified atrial fibrillation: Secondary | ICD-10-CM

## 2020-02-13 LAB — POCT INR: INR: 3.6 — AB (ref 2.0–3.0)

## 2020-02-13 NOTE — Patient Instructions (Signed)
-   skip warfarin tonight, - have a serving of greens today - continue warfarin dosage of 1.25 mg (half the 2.5 mg tablet) every day.   - recheck INR in 4 weeks  WHEN YOU GET THE DATE FOR YOUR PROCEDURE, HOLD YOUR WARFARIN FOR 5 DAYS BEFORE THE SURGERY. IF CLEARED BY THE MD, RESUME WARFARIN THAT NIGHT, WITH EXTRA 1/2 TABLET FOR 2 DAYS CALL ME IF YOU WOULD LIKE TO HAVE YOUR INR CHECKED SOONER

## 2020-02-21 ENCOUNTER — Encounter: Payer: Managed Care, Other (non HMO) | Admitting: Hospice and Palliative Medicine

## 2020-02-26 ENCOUNTER — Other Ambulatory Visit
Admission: RE | Admit: 2020-02-26 | Discharge: 2020-02-26 | Disposition: A | Discharge status: Home / Self Care | Payer: 59 | Source: Ambulatory Visit | Attending: Dentistry | Admitting: Dentistry

## 2020-02-26 ENCOUNTER — Other Ambulatory Visit: Payer: Self-pay

## 2020-02-26 ENCOUNTER — Encounter (HOSPITAL_COMMUNITY): Payer: Self-pay | Admitting: Dentistry

## 2020-02-26 DIAGNOSIS — Z20822 Contact with and (suspected) exposure to covid-19: Secondary | ICD-10-CM | POA: Insufficient documentation

## 2020-02-26 DIAGNOSIS — Z01812 Encounter for preprocedural laboratory examination: Secondary | ICD-10-CM | POA: Diagnosis not present

## 2020-02-26 NOTE — Progress Notes (Signed)
EKG:  01/04/20 CXR:  12/06/19 ECHO:  06/27/19 Stress Test:  Denies Cardiac Cath:  09/06/19  Fasting Blood Sugar-  N/A Checks Blood Sugar_N/A__ times a day  OSA/CPAP:  No  ASA:  No Blood Thinners:  Last dose Coumadin 02/20/20 for Afib.  Covid test 02/26/20  Anesthesia Review:  Yes, hx Afib.   Patient denies shortness of breath, fever, cough, and chest pain at PAT appointment.  Patient verbalized understanding of instructions provided today at the PAT appointment.  Patient asked to review instructions at home and day of surgery.

## 2020-02-27 LAB — SARS CORONAVIRUS 2 (TAT 6-24 HRS): SARS Coronavirus 2: NEGATIVE

## 2020-02-27 MED ORDER — DEXTROSE 5 % IV SOLN
3.0000 g | INTRAVENOUS | Status: DC
  Filled 2020-02-27: qty 3000

## 2020-02-27 NOTE — Progress Notes (Signed)
Anesthesia Chart Review: Same day workup  Follows with cardiology for history of severe mitral stenosis, HTN, paroxysmal atrial fibrillation on chronic anticoagulation with warfarin.  She has no significant coronary disease by cath.  She has moderate pulmonary arterial hypertension.  Cardiology is planning for mitral valve surgery and maze procedure and she needs dental extractions in preparation for this.  Cardiac clearance per telephone encounter 02/12/2020, "Chart reviewed and patient contacted by phone today as part of pre-operative protocol coverage. When I spoke to the patient 01/30/2020 she was being treated for bronchitis. She tells me this has resolved. I discussed anticoagulation with our pharmacist and our recommendation is to hold warfarin 5 days pre op.  She will not need Lovenox crossover.  Resume Warfarin as soon as safe post op. The patient was advised that if she develops new symptoms prior to surgery to contact our office to arrange for a follow-up visit, and she verbalized understanding. SBE prophylaxis is not required for the patient from a cardiac standpoint."  History of COPD/interstitial lung disease with ongoing tobacco abuse.  She will need day of surgery labs and evaluation.  EKG 01/04/2020: NSR.  Rate 67.  Right/left heart cath 09/06/2019: Final Conclusions:   No significant coronary artery disease Moderately elevated right heart pressures consistent with moderate pulmonary hypertension Recent echocardiogram with severe mitral valve stenosis  TTE 06/27/2019: 1. Left ventricular ejection fraction, by estimation, is 65 to 70%. The  left ventricle has normal function. The left ventricle has no regional  wall motion abnormalities. Left ventricular diastolic parameters are  consistent with Grade II diastolic  dysfunction (pseudonormalization).  2. Right ventricular systolic function is normal. The right ventricular  size is normal. There is moderately elevated pulmonary  artery systolic  pressure.  3. Left atrial size was moderately dilated.  4. Mitral valve mean gradient is 14mmHg at heart rate 66bpm. The mitral  valve is degenerative. Mild mitral valve regurgitation. Severe mitral  stenosis.  5. The aortic valve is normal in structure. Aortic valve regurgitation is  not visualized.  6. The inferior vena cava is normal in size with <50% respiratory  variability, suggesting right atrial pressure of 8 mmHg.   CHEST - 2 VIEW 12/06/2019: COMPARISON:  04/11/2019  FINDINGS: The heart size and mediastinal contours are within normal limits. Mild, diffuse interstitial opacity throughout the lungs, similar to prior examination. The visualized skeletal structures are unremarkable.  IMPRESSION: Mild, diffuse interstitial opacity throughout the lungs, similar to prior examination, which may reflect chronic interstitial change and would be better assessed by CT. No focal airspace opacity.  CTA chest abdomen pelvis 10/04/2019: IMPRESSION: 1. Aortic atherosclerosis without evidence of dissection or aneurysm. Aortic Atherosclerosis (ICD10-I70.0). 2. Diffuse centrilobular ground-glass attenuation nodular opacities throughout both upper lungs. This pattern is consistent with an inhalational abnormality with primary differential considerations including respiratory bronchiolitis interstitial lung disease (RB-I LD) which is strongly associated with smoking, versus subacute hypersensitivity pneumonitis. 3. Mildly thickened and calcified mitral valve. 4. Small hiatal hernia. 5. Hepatic steatosis. 6. Additional ancillary findings as above.   [name] [facility]/Anesthesiology Phone [phone] 02/27/2020 10:38 AM

## 2020-02-27 NOTE — Anesthesia Preprocedure Evaluation (Addendum)
Anesthesia Evaluation  Patient identified by MRN, date of birth, ID band Patient awake    Reviewed: Allergy & Precautions, NPO status , Patient's Chart, lab work & pertinent test results  Airway Mallampati: II  TM Distance: >3 FB Neck ROM: Full    Dental  (+) Poor Dentition, Chipped   Pulmonary shortness of breath, COPD, former smoker,    breath sounds clear to auscultation       Cardiovascular hypertension, + angina + CAD  Normal cardiovascular exam+ dysrhythmias (on warfarin) Atrial Fibrillation + Valvular Problems/Murmurs (mitral stenosis)  Rhythm:Regular - Systolic murmurs + murmur   Neuro/Psych PSYCHIATRIC DISORDERS Anxiety Depression    GI/Hepatic GERD  Controlled and Medicated,  Endo/Other    Renal/GU ARF and Renal InsufficiencyRenal disease (kidney stones)  negative genitourinary   Musculoskeletal  (+) Arthritis ,   Abdominal Normal abdominal exam  (+)   Peds negative pediatric ROS (+)  Hematology   Anesthesia Other Findings H/o breast cancer  Reproductive/Obstetrics negative OB ROS                          Anesthesia Physical Anesthesia Plan  ASA: III  Anesthesia Plan: General   Post-op Pain Management:    Induction: Intravenous  PONV Risk Score and Plan: 3 and Midazolam, Ondansetron and Dexamethasone  Airway Management Planned: Nasal ETT  Additional Equipment: None  Intra-op Plan:   Post-operative Plan: Extubation in OR  Informed Consent: I have reviewed the patients History and Physical, chart, labs and discussed the procedure including the risks, benefits and alternatives for the proposed anesthesia with the patient or authorized representative who has indicated his/her understanding and acceptance.       Plan Discussed with: CRNA and Anesthesiologist  Anesthesia Plan Comments: (Repeat INR 1.5 this AM 02/28/20.   PAT note by Karoline Caldwell, PA-C: Follows with  cardiology for history of severe mitral stenosis, HTN, paroxysmal atrial fibrillation on chronic anticoagulation with warfarin.  She has no significant coronary disease by cath.  She has moderate pulmonary arterial hypertension.  Cardiology is planning for mitral valve surgery and maze procedure and she needs dental extractions in preparation for this.  Cardiac clearance per telephone encounter 02/12/2020, "Chart reviewed and patient contacted by phone today as part of pre-operative protocol coverage. When I spoke to the patient 01/30/2020 she was being treated for bronchitis. She tells me this has resolved. I discussed anticoagulation with our pharmacist and our recommendation is to hold warfarin 5 days pre op.  She will not need Lovenox crossover.  Resume Warfarin as soon as safe post op. The patient was advised that if she develops new symptoms prior to surgery to contact our office to arrange for a follow-up visit, and she verbalized understanding. SBE prophylaxis is not required for the patient from a cardiac standpoint."  History of COPD/interstitial lung disease with ongoing tobacco abuse.  She will need day of surgery labs and evaluation.  EKG 01/04/2020: NSR.  Rate 67.  Right/left heart cath 09/06/2019: Final Conclusions:   No significant coronary artery disease Moderately elevated right heart pressures consistent with moderate pulmonary hypertension Recent echocardiogram with severe mitral valve stenosis  TTE 06/27/2019: 1. Left ventricular ejection fraction, by estimation, is 65 to 70%. The  left ventricle has normal function. The left ventricle has no regional  wall motion abnormalities. Left ventricular diastolic parameters are  consistent with Grade II diastolic  dysfunction (pseudonormalization).  2. Right ventricular systolic function is normal. The right  ventricular  size is normal. There is moderately elevated pulmonary artery systolic  pressure.  3. Left atrial size was  moderately dilated.  4. Mitral valve mean gradient is 32mmHg at heart rate 66bpm. The mitral  valve is degenerative. Mild mitral valve regurgitation. Severe mitral  stenosis.  5. The aortic valve is normal in structure. Aortic valve regurgitation is  not visualized.  6. The inferior vena cava is normal in size with <50% respiratory  variability, suggesting right atrial pressure of 8 mmHg.   CHEST - 2 VIEW 12/06/2019: COMPARISON:  04/11/2019  FINDINGS: The heart size and mediastinal contours are within normal limits. Mild, diffuse interstitial opacity throughout the lungs, similar to prior examination. The visualized skeletal structures are unremarkable.  IMPRESSION: Mild, diffuse interstitial opacity throughout the lungs, similar to prior examination, which may reflect chronic interstitial change and would be better assessed by CT. No focal airspace opacity.  CTA chest abdomen pelvis 10/04/2019: IMPRESSION: 1. Aortic atherosclerosis without evidence of dissection or aneurysm. Aortic Atherosclerosis (ICD10-I70.0). 2. Diffuse centrilobular ground-glass attenuation nodular opacities throughout both upper lungs. This pattern is consistent with an inhalational abnormality with primary differential considerations including respiratory bronchiolitis interstitial lung disease (RB-I LD) which is strongly associated with smoking, versus subacute hypersensitivity pneumonitis. 3. Mildly thickened and calcified mitral valve. 4. Small hiatal hernia. 5. Hepatic steatosis. 6. Additional ancillary findings as above.  )     Anesthesia Quick Evaluation

## 2020-02-28 ENCOUNTER — Ambulatory Visit (HOSPITAL_COMMUNITY)
Admission: RE | Admit: 2020-02-28 | Discharge: 2020-02-28 | Disposition: A | Discharge status: Home / Self Care | Payer: 59 | Attending: Dentistry | Admitting: Dentistry

## 2020-02-28 ENCOUNTER — Ambulatory Visit (HOSPITAL_COMMUNITY): Payer: 59 | Admitting: Physician Assistant

## 2020-02-28 ENCOUNTER — Encounter (HOSPITAL_COMMUNITY): Payer: Self-pay | Admitting: Dentistry

## 2020-02-28 ENCOUNTER — Encounter (HOSPITAL_COMMUNITY)
Admission: RE | Disposition: A | Discharge status: Home / Self Care | Payer: Self-pay | Source: Home / Self Care | Attending: Dentistry

## 2020-02-28 ENCOUNTER — Other Ambulatory Visit: Payer: Self-pay

## 2020-02-28 DIAGNOSIS — J441 Chronic obstructive pulmonary disease with (acute) exacerbation: Secondary | ICD-10-CM | POA: Diagnosis not present

## 2020-02-28 DIAGNOSIS — I2721 Secondary pulmonary arterial hypertension: Secondary | ICD-10-CM | POA: Insufficient documentation

## 2020-02-28 DIAGNOSIS — K0530 Chronic periodontitis, unspecified: Secondary | ICD-10-CM | POA: Insufficient documentation

## 2020-02-28 DIAGNOSIS — I050 Rheumatic mitral stenosis: Secondary | ICD-10-CM | POA: Insufficient documentation

## 2020-02-28 DIAGNOSIS — Z96653 Presence of artificial knee joint, bilateral: Secondary | ICD-10-CM | POA: Insufficient documentation

## 2020-02-28 DIAGNOSIS — Z6841 Body Mass Index (BMI) 40.0 and over, adult: Secondary | ICD-10-CM | POA: Insufficient documentation

## 2020-02-28 DIAGNOSIS — Z885 Allergy status to narcotic agent status: Secondary | ICD-10-CM | POA: Diagnosis not present

## 2020-02-28 DIAGNOSIS — Z79899 Other long term (current) drug therapy: Secondary | ICD-10-CM | POA: Insufficient documentation

## 2020-02-28 DIAGNOSIS — Z7901 Long term (current) use of anticoagulants: Secondary | ICD-10-CM | POA: Insufficient documentation

## 2020-02-28 DIAGNOSIS — Z87891 Personal history of nicotine dependence: Secondary | ICD-10-CM | POA: Diagnosis not present

## 2020-02-28 DIAGNOSIS — I4819 Other persistent atrial fibrillation: Secondary | ICD-10-CM | POA: Insufficient documentation

## 2020-02-28 DIAGNOSIS — K083 Retained dental root: Secondary | ICD-10-CM | POA: Diagnosis not present

## 2020-02-28 DIAGNOSIS — I10 Essential (primary) hypertension: Secondary | ICD-10-CM | POA: Insufficient documentation

## 2020-02-28 DIAGNOSIS — K029 Dental caries, unspecified: Secondary | ICD-10-CM

## 2020-02-28 DIAGNOSIS — Z8616 Personal history of COVID-19: Secondary | ICD-10-CM | POA: Diagnosis not present

## 2020-02-28 HISTORY — PX: MULTIPLE EXTRACTIONS WITH ALVEOLOPLASTY: SHX5342

## 2020-02-28 LAB — CBC
HCT: 34.7 % — ABNORMAL LOW (ref 36.0–46.0)
Hemoglobin: 11.5 g/dL — ABNORMAL LOW (ref 12.0–15.0)
MCH: 29.5 pg (ref 26.0–34.0)
MCHC: 33.1 g/dL (ref 30.0–36.0)
MCV: 89 fL (ref 80.0–100.0)
Platelets: 330 10*3/uL (ref 150–400)
RBC: 3.9 MIL/uL (ref 3.87–5.11)
RDW: 13.2 % (ref 11.5–15.5)
WBC: 9 10*3/uL (ref 4.0–10.5)
nRBC: 0 % (ref 0.0–0.2)

## 2020-02-28 LAB — BASIC METABOLIC PANEL
Anion gap: 14 (ref 5–15)
BUN: 15 mg/dL (ref 6–20)
CO2: 29 mmol/L (ref 22–32)
Calcium: 8.9 mg/dL (ref 8.9–10.3)
Chloride: 97 mmol/L — ABNORMAL LOW (ref 98–111)
Creatinine, Ser: 1.27 mg/dL — ABNORMAL HIGH (ref 0.44–1.00)
GFR, Estimated: 51 mL/min — ABNORMAL LOW (ref >60)
Glucose, Bld: 106 mg/dL — ABNORMAL HIGH (ref 70–99)
Potassium: 3.2 mmol/L — ABNORMAL LOW (ref 3.5–5.1)
Sodium: 140 mmol/L (ref 135–145)

## 2020-02-28 LAB — PROTIME-INR
INR: 1.5 — ABNORMAL HIGH (ref 0.8–1.2)
Prothrombin Time: 17.5 seconds — ABNORMAL HIGH (ref 11.4–15.2)

## 2020-02-28 SURGERY — MULTIPLE EXTRACTION WITH ALVEOLOPLASTY
Anesthesia: General

## 2020-02-28 MED ORDER — LIDOCAINE-EPINEPHRINE 2 %-1:100000 IJ SOLN
INTRAMUSCULAR | Status: DC | PRN
  Administered 2020-02-28: 1.7 mL via INTRADERMAL

## 2020-02-28 MED ORDER — ROCURONIUM BROMIDE 10 MG/ML (PF) SYRINGE
PREFILLED_SYRINGE | INTRAVENOUS | Status: AC
  Filled 2020-02-28: qty 10

## 2020-02-28 MED ORDER — ROCURONIUM BROMIDE 10 MG/ML (PF) SYRINGE
PREFILLED_SYRINGE | INTRAVENOUS | Status: DC | PRN
  Administered 2020-02-28: 70 mg via INTRAVENOUS

## 2020-02-28 MED ORDER — SUGAMMADEX SODIUM 200 MG/2ML IV SOLN
INTRAVENOUS | Status: DC | PRN
  Administered 2020-02-28: 200 mg via INTRAVENOUS

## 2020-02-28 MED ORDER — OXYMETAZOLINE HCL 0.05 % NA SOLN
NASAL | Status: AC
  Filled 2020-02-28: qty 30

## 2020-02-28 MED ORDER — THROMBIN 5000 UNITS EX SOLR
CUTANEOUS | Status: AC
  Filled 2020-02-28: qty 5000

## 2020-02-28 MED ORDER — CHLORHEXIDINE GLUCONATE 0.12 % MT SOLN
15.0000 mL | Freq: Once | OROMUCOSAL | Status: AC
  Administered 2020-02-28: 15 mL via OROMUCOSAL
  Filled 2020-02-28: qty 15

## 2020-02-28 MED ORDER — CEFAZOLIN SODIUM-DEXTROSE 2-3 GM-%(50ML) IV SOLR
INTRAVENOUS | Status: DC | PRN
  Administered 2020-02-28: 2 g via INTRAVENOUS

## 2020-02-28 MED ORDER — 0.9 % SODIUM CHLORIDE (POUR BTL) OPTIME
TOPICAL | Status: DC | PRN
  Administered 2020-02-28: 1000 mL

## 2020-02-28 MED ORDER — MIDAZOLAM HCL 5 MG/5ML IJ SOLN
INTRAMUSCULAR | Status: DC | PRN
  Administered 2020-02-28: 2 mg via INTRAVENOUS

## 2020-02-28 MED ORDER — LIDOCAINE 2% (20 MG/ML) 5 ML SYRINGE
INTRAMUSCULAR | Status: AC
  Filled 2020-02-28: qty 5

## 2020-02-28 MED ORDER — SUCCINYLCHOLINE CHLORIDE 200 MG/10ML IV SOSY
PREFILLED_SYRINGE | INTRAVENOUS | Status: AC
  Filled 2020-02-28: qty 10

## 2020-02-28 MED ORDER — LACTATED RINGERS IV SOLN: INTRAVENOUS | Status: DC

## 2020-02-28 MED ORDER — BUPIVACAINE-EPINEPHRINE (PF) 0.5% -1:200000 IJ SOLN
INTRAMUSCULAR | Status: AC
  Filled 2020-02-28: qty 3.6

## 2020-02-28 MED ORDER — MIDAZOLAM HCL 2 MG/2ML IJ SOLN
INTRAMUSCULAR | Status: AC
  Filled 2020-02-28: qty 2

## 2020-02-28 MED ORDER — PROPOFOL 10 MG/ML IV BOLUS
INTRAVENOUS | Status: AC
  Filled 2020-02-28: qty 20

## 2020-02-28 MED ORDER — LIDOCAINE 2% (20 MG/ML) 5 ML SYRINGE
INTRAMUSCULAR | Status: DC | PRN
  Administered 2020-02-28: 80 mg via INTRAVENOUS

## 2020-02-28 MED ORDER — PROPOFOL 10 MG/ML IV BOLUS
INTRAVENOUS | Status: DC | PRN
  Administered 2020-02-28: 150 mg via INTRAVENOUS

## 2020-02-28 MED ORDER — FENTANYL CITRATE (PF) 100 MCG/2ML IJ SOLN
INTRAMUSCULAR | Status: DC | PRN
  Administered 2020-02-28 (×3): 50 ug via INTRAVENOUS

## 2020-02-28 MED ORDER — FENTANYL CITRATE (PF) 250 MCG/5ML IJ SOLN
INTRAMUSCULAR | Status: AC
  Filled 2020-02-28: qty 5

## 2020-02-28 MED ORDER — LIDOCAINE-EPINEPHRINE 2 %-1:100000 IJ SOLN
INTRAMUSCULAR | Status: AC
  Filled 2020-02-28: qty 10.2

## 2020-02-28 MED ORDER — ORAL CARE MOUTH RINSE: 15.0000 mL | Freq: Once | OROMUCOSAL | Status: AC

## 2020-02-28 MED ORDER — ONDANSETRON HCL 4 MG/2ML IJ SOLN
INTRAMUSCULAR | Status: DC | PRN
  Administered 2020-02-28: 4 mg via INTRAVENOUS

## 2020-02-28 MED ORDER — PHENYLEPHRINE 40 MCG/ML (10ML) SYRINGE FOR IV PUSH (FOR BLOOD PRESSURE SUPPORT)
PREFILLED_SYRINGE | INTRAVENOUS | Status: DC | PRN
  Administered 2020-02-28 (×2): 40 ug via INTRAVENOUS

## 2020-02-28 SURGICAL SUPPLY — 32 items
ALCOHOL 70% 16 OZ (MISCELLANEOUS) ×2 IMPLANT
BLADE SURG 15 STRL LF DISP TIS (BLADE) ×1 IMPLANT
BLADE SURG 15 STRL SS (BLADE) ×1
COVER SURGICAL LIGHT HANDLE (MISCELLANEOUS) ×2 IMPLANT
COVER WAND RF STERILE (DRAPES) ×2 IMPLANT
GAUZE 4X4 16PLY RFD (DISPOSABLE) ×2 IMPLANT
GAUZE PACKING FOLDED 2  STR (GAUZE/BANDAGES/DRESSINGS) ×1
GAUZE PACKING FOLDED 2 STR (GAUZE/BANDAGES/DRESSINGS) ×1 IMPLANT
GLOVE BIO SURGEON STRL SZ 6.5 (GLOVE) ×2 IMPLANT
GLOVE SURG SS PI 6.0 STRL IVOR (GLOVE) ×2 IMPLANT
GOWN STRL REUS W/ TWL LRG LVL3 (GOWN DISPOSABLE) ×2 IMPLANT
GOWN STRL REUS W/TWL LRG LVL3 (GOWN DISPOSABLE) ×2
KIT BASIN OR (CUSTOM PROCEDURE TRAY) ×2 IMPLANT
KIT TURNOVER KIT B (KITS) ×2 IMPLANT
MANIFOLD NEPTUNE II (INSTRUMENTS) ×2 IMPLANT
NS IRRIG 1000ML POUR BTL (IV SOLUTION) ×2 IMPLANT
PACK EENT II TURBAN DRAPE (CUSTOM PROCEDURE TRAY) ×2 IMPLANT
PAD ARMBOARD 7.5X6 YLW CONV (MISCELLANEOUS) ×2 IMPLANT
SPONGE SURGIFOAM ABS GEL 100 (HEMOSTASIS) IMPLANT
SPONGE SURGIFOAM ABS GEL 12-7 (HEMOSTASIS) IMPLANT
SPONGE SURGIFOAM ABS GEL SZ50 (HEMOSTASIS) IMPLANT
SUCTION FRAZIER HANDLE 10FR (MISCELLANEOUS) ×1
SUCTION TUBE FRAZIER 10FR DISP (MISCELLANEOUS) ×1 IMPLANT
SUT CHROMIC 3 0 PS 2 (SUTURE) ×4 IMPLANT
SUT CHROMIC 4 0 P 3 18 (SUTURE) IMPLANT
SYR 50ML SLIP (SYRINGE) ×2 IMPLANT
SYR BULB IRRIG 60ML STRL (SYRINGE) ×2 IMPLANT
TOWEL GREEN STERILE FF (TOWEL DISPOSABLE) ×2 IMPLANT
TUBE CONNECTING 12X1/4 (SUCTIONS) ×2 IMPLANT
WATER STERILE IRR 1000ML POUR (IV SOLUTION) ×2 IMPLANT
WATER TABLETS ICX (MISCELLANEOUS) ×2 IMPLANT
YANKAUER SUCT BULB TIP NO VENT (SUCTIONS) ×2 IMPLANT

## 2020-02-28 NOTE — Discharge Instructions (Signed)
[facility] Dr. Taeden Geller B. Siedah Sedor, DMD Phone: [phone] Fax: [phone]    MOUTH CARE AFTER SURGERY   FACTS:  Ice used in ice bag helps keep the swelling down, and can help lessen the pain.  It is easier to treat pain BEFORE it happens.  Spitting disturbs the clot and may cause bleeding to start again, or to get worse.  Smoking delays healing and can cause complications.  Sharing prescriptions can be dangerous.  Do not take medications not recently prescribed for you.  Antibiotics may stop birth control pills from working.  Use other means of birth control while on antibiotics.  Warm salt water rinses after the first 24 hours will help lessen the swelling:  Use 1/2 teaspoonful of table salt per oz.of water.  DO NOT:  Do not spit.    Do not drink through a straw.  Strongly advised not to smoke, dip snuff or chew tobacco at least for 3 days.  Do not eat sharp or crunchy foods.  Avoid the area of surgery when chewing.  Do not stop your antibiotics before your instructions say to do so.  Do not eat hot foods until bleeding has stopped.  If you need to, let your food cool down to room temperature.  EXPECT:  Some swelling, especially first 2-3 days.  Soreness or discomfort in varying degrees.  Follow your dentist's instructions about how to handle pain before it starts.  Pinkish saliva or light blood in saliva, or on your pillow in the morning.  This can last around 24 hours.  Bruising inside or outside the mouth.  This may not show up until 2-3 days after surgery.  Don't worry, it will go away in time.  Pieces of "bone" may work themselves loose.  It's OK.  If they bother you, let us know.  WHAT TO DO IMMEDIATELY AFTER SURGERY:  Bite on gauze with steady pressure for 30-45 minutes at a time.  Switch out the gauze after 30-45 minutes for clean gauze, and continue this for 1-2 hours or until bleeding subsides.  Do not chew on the  gauze.  Do not lie down flat.  Raise your head support especially for the first 24 hours.  Apply ice to your face on the side of the surgery.  You may apply it 20 minutes on and a few minutes off.  Ice for 8-12 hours.  You may use ice up to 24 hours.  Before the numbness wears off, take a pain pill as instructed.  Prescription pain medication is not always required.  SWELLING:  Expect swelling for the first couple of days.  It should get better after that.  If swelling increases 3 days or so after surgery, let us know as soon as possible.  FEVER:  Take Tylenol every 4 hours if needed to lower your temperature, especially if it is at 100F or higher.  Drink lots of fluids.  If the fever does not go away, let us know.  BREATHING TROUBLE:  Any unusual difficulty breathing means you have to have someone bring you to the emergency room ASAP.  BLEEDING:  Light oozing is expected for 24 hours or so.  Prop head up with pillows.  Do not spit.  Do not confuse bright red fresh flowing blood with lots of saliva colored with a little bit of blood.  If you notice some bleeding, place gauze or a tea bag where it is bleeding and apply CONSTANT pressure by biting   down for 1 hour.  Avoid talking during this time.  Do not remove the gauze or tea bag during this hour to "check" the bleeding.  If you notice bright RED bleeding FLOWING out of particular area, and filling the floor of your mouth, put a wad of gauze on that area, bite down firmly and constantly.  Call us immediately.  If we're closed, have someone bring you to the emergency room.  ORAL HYGIENE:  Brush your teeth as usual after meals and before bedtime.  Use a soft toothbrush around the area of surgery.  DO NOT AVOID BRUSHING.  Otherwise bacteria(germs) will grow and may delay healing or encourage infection.  Since you cannot spit, just gently rinse and let the water flow out of your mouth.  DO NOT SWISH  HARD.  EATING:  Cool liquids are a good point to start.  Increase to soft foods as tolerated.  PRESCRIPTIONS:  Follow the directions for your prescriptions exactly as written.  If your doctor gave you a narcotic pain medication, do not drive, operate machinery or drink alcohol when on that medication.  QUESTIONS:  Call our office during office hours [phone] or call the Emergency Room at [phone]. 

## 2020-02-28 NOTE — Interval H&P Note (Signed)
History and Physical Interval Note:  02/28/2020 11:10 AM  Leslie [name]  has presented today for surgery, with the diagnosis of [place].  The various methods of treatment have been discussed with the patient and family. After consideration of risks, benefits and other options for treatment, the patient has consented to  Procedure(s): MULTIPLE EXTRACTION WITH ALVEOLOPLASTY (N/A) as a surgical intervention.  The patient's history has been reviewed, patient examined, no change in status, stable for surgery.  I have reviewed the patient's chart and labs.  Questions were answered to the patient's satisfaction.     [name]

## 2020-02-28 NOTE — Op Note (Signed)
OPERATIVE NOTE  Patient Name:  Leslie [name] Date of Birth:   1965/01/22 Medical Record Number: [id number]  DATE OF SURGERY:  02/28/2020  SURGEON: Amarri Satterly B. Benson Norway, DMD  ASSISTANT: Molli Posey, DAII  PREOPERATIVE DIAGNOSES: Patient Active Problem List   Diagnosis Date Noted  . COPD exacerbation ([place]) 01/30/2020  . Anticoagulated 01/30/2020  . Tightness in chest 01/02/2020  . Encounter for long-term (current) use of high-risk medication 01/02/2020  . BMI 39.0-39.9,adult 09/30/2019  . COVID-19 virus infection 09/09/2019  . Unstable angina ([place]) 09/06/2019  . 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 Encounter for general adult medical examination with abnormal findings 02/21/2019  . Oral candidiasis 02/21/2019  . Acute kidney failure, unspecified ([place]) 12/09/2018  . Right knee pain 11/27/2018  . GERD (gastroesophageal reflux disease) 10/25/2018  . CKD (chronic kidney disease), stage III ([place]) 10/25/2018  . CHF (congestive heart failure) ([place]) 10/25/2018  . Pyelonephritis 10/01/2018  . Absolute anemia 10/01/2018  . Iron deficiency anemia secondary to blood loss (chronic)   . Polyp of descending colon   . Acute on chronic heart failure with preserved ejection fraction (HFpEF) ([place])   . AKI (acute kidney injury) ([place])   . Sepsis ([place]) 08/26/2018  . (HFpEF) heart failure with preserved ejection fraction ([place])   . Renal calculus, right 07/03/2018  . Low back pain 06/27/2018  . Bacterial vaginitis 06/27/2018  . Pedal edema 06/27/2018  . Coronary artery disease, non-occlusive 04/10/2018  . Acute on chronic diastolic CHF (congestive heart failure) ([place]) 04/10/2018  . Pre-operative clearance 03/16/2018  . Community acquired pneumonia 02/16/2018  . Elevated brain natriuretic peptide (BNP) level 02/16/2018  . Abnormal renal function 02/16/2018  . Acute recurrent pansinusitis 01/13/2018  . Candidiasis 01/13/2018  . Anxiety 01/13/2018  . Osteoarthritis of knee 01/09/2018  .  Morbid obesity with body mass index (BMI) of 40.0 or higher ([place]) 11/13/2017  . Major depressive disorder 09/22/2017  . Mitral valve stenosis   . Atrial fibrillation ([place]) 09/11/2017  . Generalized abdominal pain 09/08/2017  . Episode of recurrent major depressive disorder ([place]) 09/08/2017  . Hypertension 06/06/2017  . Cough 06/06/2017  . Tobacco dependency 06/06/2017  . Urinary tract infection with hematuria 04/28/2017  . Acute upper respiratory infection 04/28/2017  . Flu-like symptoms 04/28/2017  . Dysuria 04/28/2017  . Vaginal candidiasis 04/28/2017  . Pain of left lower extremity 02/11/2016  . 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 Fibrocystic breast disease 03/23/2013  . Family history of breast cancer 07/20/2012   Dental caries, periodontal disease  POSTOPERATIVE DIAGNOSES: Same  PROCEDURES PERFORMED: 1. Extractions of teeth numbers 2, 4, 5 and 18 2. 1 Quadrants of alveoloplasty (URQ)  ANESTHESIA: General anesthesia via nasal endotracheal tube.  MEDICATIONS: 1. Ancef 2 g IV prior to invasive dental procedures. 2. Local anesthesia with a total utilization of 2 cartridges of 34 mg of lidocaine with 0.018 mg of epinephrine/ea  SPECIMENS: 4 teeth that were extracted and discarded.  DRAINS/CULTURES: None  COMPLICATIONS: None  ESTIMATED BLOOD LOSS: 5 mL  INTRAVENOUS FLUIDS: 10 mL of Lactated ringers solution.  INDICATIONS: The patient was recently diagnosed with severe mitral stenosis.  A medically necessary dental consult was then requested to evaluate the patient prior to cardiac intervention.  The patient was examined and treatment planned for multiple extractions with alveoloplasty as needed.  This treatment plan was formulated to decrease the perioperative and postoperative risks and complications associated with dental infection from affecting the patient's systemic health.  OPERATIVE FINDINGS:  The patient was examined in operating room number 8.   The indicated teeth were identified for extraction. The patient was noted be affected by chronic periodontitis and severe dental caries.  DESCRIPTION OF PROCEDURE: Patient was identified in the holding area and brought to the main operating room number 8 by the anesthesia team. Patient was then placed in the supine position on the operating table. General anesthesia was then induced per the anesthesia team. The patient was then prepped and draped in the usual sterile fashion for dental medicine procedures. A timeout was performed. The patient was identified and procedures were verified. A throat pack was placed at this time. The oral cavity was then thoroughly examined with the findings noted above. The patient was then ready for dental medicine procedure as follows:  ROUTINE EXTRACTIONS: Local anesthesia was administered sequentially with a total utilization of 2 cartridges each containing 34 mg of lidocaine with 0.018 mg of epinephrine.  Location of anesthesia included URQ Maxillary infiltration and palatal, #18 infiltration and lingual.  The Maxillary right quadrant was first approached. The teeth were then subluxated with a series of straight elevators. Tooth numbers 2, 4 and 5 were then removed with a Molt-9 elevator and 150 forceps without complications. Alveoloplasty was then performed utilizing a ronguers and bone file. The surgical sites were then curetted and irrigated with copious amounts of sterile saline. The tissues were approximated and trimmed appropriately. Surgi Foam was placed in each extraction site. The surgical sites were all closed using 3-0 chromic gut sutures as follows: #4 and #5 with Figure-8 style suture and #2 simple interrupted.  The Mandibular left quadrant was then approached. The tooth was subluxated with a series of straight elevators. Tooth number 18 was then removed utilizing a 151 forceps. The tissues were approximated and trimmed appropriately. The surgical site was  curetted and then irrigated with copious amounts of sterile saline. Surgi Foam was placed in extraction site. The surgical site was closed using 3-0 chromic gut sutures as follows: Figure-8 style suture  END OF PROCEDURE: Thorough oral irrigation with sterile saline was performed. The patient was examined for complications, seeing none, the dental medicine procedure was deemed to be complete. The throat pack was removed at this time. An oral airway was then placed at the request of the anesthesia team. A series of 4 x 4 gauze were placed in the mouth to aid hemostasis as needed. The patient was then handed over to the anesthesia team for final disposition. After an appropriate amount of time, the patient was extubated and taken to the postanesthsia care unit in stable condition. All counts were correct for the dental medicine procedure.   [place] Benson [place], DMD

## 2020-02-28 NOTE — Anesthesia Procedure Notes (Signed)
Procedure Name: Intubation Date/Time: 02/28/2020 11:40 AM Performed by: Hoy Morn, CRNA Pre-anesthesia Checklist: Patient identified, Emergency Drugs available, Suction available and Patient being monitored Patient Re-evaluated:Patient Re-evaluated prior to induction Oxygen Delivery Method: Circle system utilized Preoxygenation: Pre-oxygenation with 100% oxygen Induction Type: IV induction Ventilation: Mask ventilation without difficulty Laryngoscope Size: Mac Nasal Tubes: Nasal prep performed and Nasal Rae Tube size: 6.5 mm Number of attempts: 1 Placement Confirmation: ETT inserted through vocal cords under direct vision,  positive ETCO2 and breath sounds checked- equal and bilateral Tube secured with: Tape Dental Injury: Teeth and Oropharynx as per pre-operative assessment

## 2020-02-28 NOTE — Transfer of Care (Signed)
Immediate Anesthesia Transfer of Care Note  Patient: Leslie [name]  Procedure(s) Performed: MULTIPLE EXTRACTION WITH ALVEOLOPLASTY (N/A )  Patient Location: PACU  Anesthesia Type:General  Level of Consciousness: awake  Airway & Oxygen Therapy: Patient Spontanous Breathing and Patient connected to face mask oxygen  Post-op Assessment: Report given to RN and Post -op Vital signs reviewed and stable  Post vital signs: Reviewed and stable  Last Vitals:  Vitals Value Taken Time  BP 170/87 02/28/20 1242  Temp    Pulse 89 02/28/20 1244  Resp 16 02/28/20 1244  SpO2 91 % 02/28/20 1244  Vitals shown include unvalidated device data.  Last Pain:  Vitals:   02/28/20 0911  TempSrc:   PainSc: 0-No pain      Patients Stated Pain Goal: 2 (54/62/70 3500)  Complications: No complications documented.

## 2020-02-29 ENCOUNTER — Encounter (HOSPITAL_COMMUNITY): Payer: Self-pay | Admitting: Dentistry

## 2020-02-29 ENCOUNTER — Telehealth (HOSPITAL_COMMUNITY): Payer: Self-pay | Admitting: Dentistry

## 2020-02-29 NOTE — Anesthesia Postprocedure Evaluation (Signed)
Anesthesia Post Note  Patient: Leslie [name]  Procedure(s) Performed: MULTIPLE EXTRACTION WITH ALVEOLOPLASTY (N/A )     Patient location during evaluation: PACU Anesthesia Type: General Level of consciousness: awake and alert Pain management: pain level controlled Vital Signs Assessment: post-procedure vital signs reviewed and stable Respiratory status: spontaneous breathing, nonlabored ventilation and respiratory function stable Cardiovascular status: blood pressure returned to baseline and stable Postop Assessment: no apparent nausea or vomiting Anesthetic complications: no   No complications documented.  Last Vitals:  Vitals:   02/28/20 1255 02/28/20 1258  BP: (!) 182/91 (!) 178/89  Pulse:  79  Resp:  11  Temp:    SpO2:  98%    Last Pain:  Vitals:   02/28/20 1244  TempSrc:   PainSc: 0-No pain   Pain Goal: Patients Stated Pain Goal: 2 (02/28/20 0911)                 Merlinda Frederick

## 2020-02-29 NOTE — Telephone Encounter (Signed)
02/29/2020 @ 2:56PM Attempted to contact the patient to see how she was doing after her dental surgery in the OR yesterday (2/10).  No answer, left v/m.   Mesic Benson Norway, DMD

## 2020-03-02 ENCOUNTER — Other Ambulatory Visit: Payer: Self-pay | Admitting: Cardiovascular Disease

## 2020-03-07 ENCOUNTER — Other Ambulatory Visit: Payer: Self-pay | Admitting: Hospice and Palliative Medicine

## 2020-03-07 DIAGNOSIS — F419 Anxiety disorder, unspecified: Secondary | ICD-10-CM

## 2020-03-07 MED ORDER — ALPRAZOLAM 0.5 MG PO TABS
0.5000 mg | ORAL_TABLET | Freq: Every evening | ORAL | 0 refills | Status: DC | PRN
Start: 2020-03-07 — End: 2020-04-11

## 2020-03-08 ENCOUNTER — Other Ambulatory Visit: Payer: Self-pay | Admitting: Family

## 2020-03-08 LAB — LIPID PANEL WITH LDL/HDL RATIO
Cholesterol, Total: 204 mg/dL — ABNORMAL HIGH (ref 100–199)
HDL: 35 mg/dL — ABNORMAL LOW (ref >39)
LDL Chol Calc (NIH): 129 mg/dL — ABNORMAL HIGH (ref 0–99)
LDL/HDL Ratio: 3.7 ratio — ABNORMAL HIGH (ref 0.0–3.2)
Triglycerides: 224 mg/dL — ABNORMAL HIGH (ref 0–149)
VLDL Cholesterol Cal: 40 mg/dL (ref 5–40)

## 2020-03-08 LAB — CBC WITH DIFFERENTIAL/PLATELET
Basophils Absolute: 0 10*3/uL (ref 0.0–0.2)
Basos: 1 %
EOS (ABSOLUTE): 0.1 10*3/uL (ref 0.0–0.4)
Eos: 1 %
Hematocrit: 33.7 % — ABNORMAL LOW (ref 34.0–46.6)
Hemoglobin: 11.3 g/dL (ref 11.1–15.9)
Immature Grans (Abs): 0 10*3/uL (ref 0.0–0.1)
Immature Granulocytes: 0 %
Lymphocytes Absolute: 2.4 10*3/uL (ref 0.7–3.1)
Lymphs: 38 %
MCH: 28.8 pg (ref 26.6–33.0)
MCHC: 33.5 g/dL (ref 31.5–35.7)
MCV: 86 fL (ref 79–97)
Monocytes Absolute: 0.6 10*3/uL (ref 0.1–0.9)
Monocytes: 8 %
Neutrophils Absolute: 3.4 10*3/uL (ref 1.4–7.0)
Neutrophils: 52 %
Platelets: 447 10*3/uL (ref 150–450)
RBC: 3.92 x10E6/uL (ref 3.77–5.28)
RDW: 12.7 % (ref 11.7–15.4)
WBC: 6.5 10*3/uL (ref 3.4–10.8)

## 2020-03-08 LAB — COMPREHENSIVE METABOLIC PANEL
ALT: 23 IU/L (ref 0–32)
AST: 24 IU/L (ref 0–40)
Albumin/Globulin Ratio: 1.3 (ref 1.2–2.2)
Albumin: 4.4 g/dL (ref 3.8–4.9)
Alkaline Phosphatase: 184 IU/L — ABNORMAL HIGH (ref 44–121)
BUN/Creatinine Ratio: 11 (ref 9–23)
BUN: 14 mg/dL (ref 6–24)
Bilirubin Total: 0.3 mg/dL (ref 0.0–1.2)
CO2: 24 mmol/L (ref 20–29)
Calcium: 9.2 mg/dL (ref 8.7–10.2)
Chloride: 98 mmol/L (ref 96–106)
Creatinine, Ser: 1.26 mg/dL — ABNORMAL HIGH (ref 0.57–1.00)
GFR calc Af Amer: 56 mL/min/{1.73_m2} — ABNORMAL LOW (ref >59)
GFR calc non Af Amer: 49 mL/min/{1.73_m2} — ABNORMAL LOW (ref >59)
Globulin, Total: 3.4 g/dL (ref 1.5–4.5)
Glucose: 117 mg/dL — ABNORMAL HIGH (ref 65–99)
Potassium: 4 mmol/L (ref 3.5–5.2)
Sodium: 140 mmol/L (ref 134–144)
Total Protein: 7.8 g/dL (ref 6.0–8.5)

## 2020-03-08 LAB — TSH+FREE T4
Free T4: 1.38 ng/dL (ref 0.82–1.77)
TSH: 0.25 u[IU]/mL — ABNORMAL LOW (ref 0.450–4.500)

## 2020-03-10 ENCOUNTER — Ambulatory Visit (INDEPENDENT_AMBULATORY_CARE_PROVIDER_SITE_OTHER): Payer: 59 | Admitting: Thoracic Surgery (Cardiothoracic Vascular Surgery)

## 2020-03-10 ENCOUNTER — Encounter: Payer: Self-pay | Admitting: Thoracic Surgery (Cardiothoracic Vascular Surgery)

## 2020-03-10 ENCOUNTER — Other Ambulatory Visit: Payer: Self-pay

## 2020-03-10 VITALS — BP 141/79 | HR 94 | Resp 20 | Ht 64.0 in | Wt 245.0 lb

## 2020-03-10 DIAGNOSIS — I050 Rheumatic mitral stenosis: Secondary | ICD-10-CM

## 2020-03-10 DIAGNOSIS — I480 Paroxysmal atrial fibrillation: Secondary | ICD-10-CM

## 2020-03-10 NOTE — Telephone Encounter (Signed)
Called to verify how patient is currently taking Potassium - No answer LMOV.

## 2020-03-10 NOTE — Progress Notes (Addendum)
[address]       [place] [address]             [phone]     CARDIOTHORACIC SURGERY OFFICE NOTE  Primary Cardiologist is Ida Rogue, MD PCP is Ronnell Freshwater, NP   HPI:  Patient is 55 year old morbidly obese African-American female with history of mitral stenosis, chronic diastolic congestive heart failure, recurrent paroxysmal atrial fibrillation on long-term warfarin anticoagulation, hypertension, hyperlipidemia, GE reflux disease, stage III chronic kidney disease, kidney stones, and possible interstitial lung disease who returns to the office today to further discuss treatment options for management of severe symptomatic mitral stenosis.  She was originally seen in consultation on September 10, 2019.  Since then she underwent dental examination and after multiple consultation visits she finally went through with dental extraction earlier this month.  She has been seen in follow-up by Dr. Rockey Situ most recently on December 31, 2019.  She returns to our office today and reports that overall she has remained essentially stable.  She describes stable symptoms of exertional shortness of breath that she feels may have gotten a little bit worse over the past 6 months.  For the most part she is comfortable with low-level activity but she gets short of breath fairly easily with any kind of strenuous exertion.  She reports occasional episodes of resting shortness of breath and PND.  She has occasional tachypalpitations and notes that her breathing gets much worse when she feels as though her heart is out of rhythm.  She has not had lower extremity edema.  She continues to take Demadex and does not have any episodes where she doubles up on doses of diuretic due to fluid overload.  She does take extra metoprolol occasionally when her heart is racing.  The remainder of her review of systems is unremarkable.    Current Outpatient Medications  Medication Sig Dispense Refill   . acetaminophen (TYLENOL) 500 MG tablet Take 1,000 mg by mouth every 6 (six) hours as needed for moderate pain or fever.    Marland Kitchen albuterol (VENTOLIN HFA) 108 (90 Base) MCG/ACT inhaler Inhale 2 puffs into the lungs every 6 (six) hours as needed for wheezing or shortness of breath. 8 g 2  . ALPRAZolam (XANAX) 0.5 MG tablet Take 1 tablet (0.5 mg total) by mouth at bedtime as needed for anxiety. 30 tablet 0  . atorvastatin (LIPITOR) 80 MG tablet TAKE 1 TABLET BY MOUTH EVERY DAY AT 6 PM (Patient taking differently: Take 80 mg by mouth daily.) 90 tablet 1  . calcium carbonate (TUMS - DOSED IN MG ELEMENTAL CALCIUM) 500 MG chewable tablet Chew 500 mg by mouth daily as needed for indigestion or heartburn.    . diltiazem (CARDIZEM CD) 120 MG 24 hr capsule TAKE 1 CAPSULE(120 MG) BY MOUTH DAILY (Patient taking differently: Take 120 mg by mouth daily.) 90 capsule 1  . fluticasone (FLONASE) 50 MCG/ACT nasal spray Place 1 spray into both nostrils daily as needed for rhinitis.    . metoprolol succinate (TOPROL-XL) 50 MG 24 hr tablet TAKE 1 TABLET BY MOUTH EVERY MORNING AND 2 TABLETS BY MOUTH EVERY NIGHT AT BEDTIME (Patient taking differently: Take 50-100 mg by mouth See admin instructions. Take 50 mg in the morning and 100 mg at  bedtime) 270 tablet 1  . omeprazole (PRILOSEC) 40 MG capsule Take 1 capsule (40 mg total) by mouth daily. 90 capsule 1  . potassium chloride (KLOR-CON) 10 MEQ tablet Take  30 mg in the morning and 20 mg at bedtime 450 tablet 0  . torsemide (DEMADEX) 20 MG tablet TAKE 2 TABLETS(40 MG) BY MOUTH TWICE DAILY 360 tablet 3  . warfarin (COUMADIN) 2.5 MG tablet TAKE AS DIRECTED BY ANTI-COAG CLINIC (Patient taking differently: Take 1.25 mg by mouth daily at 4 PM.) 90 tablet 0   No current facility-administered medications for this visit.      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  She describes a long history of progressive symptoms of exertional shortness of breath, fatigue, orthopnea, and lower extremity edema consistent with chronic diastolic congestive heart failure, recently New York Heart Association functional class III.  The patient also has history of paroxysmal atrial fibrillation with recently increasing episodes of tachypalpitations associated with resting shortness of breath and orthopnea.  I have again reviewed the patient's most recent transthoracic echocardiogram and diagnostic cardiac catheterization, both of which were performed last summer.  Echocardiogram reveals normal left ventricular size and systolic function with moderate left atrial enlargement and severe mitral stenosis.  Acoustic windows on recent transthoracic echocardiogram are somewhat suboptimal, but functional anatomy appears consistent with rheumatic disease with at least moderate thickening and restricted leaflet mobility involving both leaflets of the mitral valve.  Mean transvalvular gradient across mitral valve is been estimated greater than 10 mmHg and by planimetry mitral valve area was estimated only 1.09 cm.  There appears to be only mild mitral regurgitation.  Diagnostic cardiac catheterization was notable for the absence of significant coronary artery disease and confirmed the presence of moderate pulmonary hypertension.    Plan:  The patient and her husband were again counseled at length regarding the options for  treatment of severe symptomatic mitral stenosis and recurrent paroxysmal atrial fibrillation.  The natural history of rheumatic mitral valve disease was discussed.  Long-term prognosis with continued medical therapy without some type of surgical intervention were discussed. As a next step we discussed proceeding with TEE to consider whether or not an attempt at balloon mitral valvuloplasty might be feasible.  Surgical options were discussed including elective mitral valve replacement with or without concomitant Maze procedure.   We discussed the fact that with definitive surgery the patient will need valve replacement rather than valve repair.  Under the circumstances we discussed the possibility of replacing the mitral valve using a mechanical prosthesis with the attendant need for long-term anticoagulation using warfarin versus the alternative of replacing it using a bioprosthetic tissue valve with its potential for late structural valve deterioration and failure, depending upon the patient's longevity.  The patient specifically requests that if the mitral valve must be replaced that it be done using a mechanical valve.  Because of the patient's morbid obesity and baseline pulmonary hypertension, I would be very reluctant to consider minimally invasive approach for surgery in favor conventional median sternotomy.  Expectations for the patient's postoperative convalescence following uncomplicated elective surgery were reviewed.  All of their questions have been addressed.  The patient will return to our office in approximately 2 to 3 weeks once transesophageal echocardiogram has been performed.    I spent in excess of 15 minutes during the conduct of this office consultation and >50% of this time involved direct face-to-face encounter with the patient for counseling and/or coordination of their care.    Valentina Gu. Roxy Manns, MD 03/10/2020 1:12 PM

## 2020-03-10 NOTE — H&P (View-Only) (Signed)
[address]       [place],Leslie [name] [address]             [phone]     CARDIOTHORACIC SURGERY OFFICE NOTE  Primary Cardiologist is Ida Rogue, MD PCP is Ronnell Freshwater, NP   HPI:  Patient is 55 year old morbidly obese African-American female with history of mitral stenosis, chronic diastolic congestive heart failure, recurrent paroxysmal atrial fibrillation on long-term warfarin anticoagulation, hypertension, hyperlipidemia, GE reflux disease, stage III chronic kidney disease, kidney stones, and possible interstitial lung disease who returns to the office today to further discuss treatment options for management of severe symptomatic mitral stenosis.  She was originally seen in consultation on September 10, 2019.  Since then she underwent dental examination and after multiple consultation visits she finally went through with dental extraction earlier this month.  She has been seen in follow-up by Dr. Rockey Situ most recently on December 31, 2019.  She returns to our office today and reports that overall she has remained essentially stable.  She describes stable symptoms of exertional shortness of breath that she feels may have gotten a little bit worse over the past 6 months.  For the most part she is comfortable with low-level activity but she gets short of breath fairly easily with any kind of strenuous exertion.  She reports occasional episodes of resting shortness of breath and PND.  She has occasional tachypalpitations and notes that her breathing gets much worse when she feels as though her heart is out of rhythm.  She has not had lower extremity edema.  She continues to take Demadex and does not have any episodes where she doubles up on doses of diuretic due to fluid overload.  She does take extra metoprolol occasionally when her heart is racing.  The remainder of her review of systems is unremarkable.    Current Outpatient Medications  Medication Sig Dispense Refill   . acetaminophen (TYLENOL) 500 MG tablet Take 1,000 mg by mouth every 6 (six) hours as needed for moderate pain or fever.    Marland Kitchen albuterol (VENTOLIN HFA) 108 (90 Base) MCG/ACT inhaler Inhale 2 puffs into the lungs every 6 (six) hours as needed for wheezing or shortness of breath. 8 g 2  . ALPRAZolam (XANAX) 0.5 MG tablet Take 1 tablet (0.5 mg total) by mouth at bedtime as needed for anxiety. 30 tablet 0  . atorvastatin (LIPITOR) 80 MG tablet TAKE 1 TABLET BY MOUTH EVERY DAY AT 6 PM (Patient taking differently: Take 80 mg by mouth daily.) 90 tablet 1  . calcium carbonate (TUMS - DOSED IN MG ELEMENTAL CALCIUM) 500 MG chewable tablet Chew 500 mg by mouth daily as needed for indigestion or heartburn.    . diltiazem (CARDIZEM CD) 120 MG 24 hr capsule TAKE 1 CAPSULE(120 MG) BY MOUTH DAILY (Patient taking differently: Take 120 mg by mouth daily.) 90 capsule 1  . fluticasone (FLONASE) 50 MCG/ACT nasal spray Place 1 spray into both nostrils daily as needed for rhinitis.    . metoprolol succinate (TOPROL-XL) 50 MG 24 hr tablet TAKE 1 TABLET BY MOUTH EVERY MORNING AND 2 TABLETS BY MOUTH EVERY NIGHT AT BEDTIME (Patient taking differently: Take 50-100 mg by mouth See admin instructions. Take 50 mg in the morning and 100 mg at  bedtime) 270 tablet 1  . omeprazole (PRILOSEC) 40 MG capsule Take 1 capsule (40 mg total) by mouth daily. 90 capsule 1  . potassium chloride (KLOR-CON) 10 MEQ tablet Take  30 mg in the morning and 20 mg at bedtime 450 tablet 0  . torsemide (DEMADEX) 20 MG tablet TAKE 2 TABLETS(40 MG) BY MOUTH TWICE DAILY 360 tablet 3  . warfarin (COUMADIN) 2.5 MG tablet TAKE AS DIRECTED BY ANTI-COAG CLINIC (Patient taking differently: Take 1.25 mg by mouth daily at 4 PM.) 90 tablet 0   No current facility-administered medications for this visit.      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  She describes a long history of progressive symptoms of exertional shortness of breath, fatigue, orthopnea, and lower extremity edema consistent with chronic diastolic congestive heart failure, recently New York Heart Association functional class III.  The patient also has history of paroxysmal atrial fibrillation with recently increasing episodes of tachypalpitations associated with resting shortness of breath and orthopnea.  I have again reviewed the patient's most recent transthoracic echocardiogram and diagnostic cardiac catheterization, both of which were performed last summer.  Echocardiogram reveals normal left ventricular size and systolic function with moderate left atrial enlargement and severe mitral stenosis.  Acoustic windows on recent transthoracic echocardiogram are somewhat suboptimal, but functional anatomy appears consistent with rheumatic disease with at least moderate thickening and restricted leaflet mobility involving both leaflets of the mitral valve.  Mean transvalvular gradient across mitral valve is been estimated greater than 10 mmHg and by planimetry mitral valve area was estimated only 1.09 cm.  There appears to be only mild mitral regurgitation.  Diagnostic cardiac catheterization was notable for the absence of significant coronary artery disease and confirmed the presence of moderate pulmonary hypertension.    Plan:  The patient and her husband were again counseled at length regarding the options for  treatment of severe symptomatic mitral stenosis and recurrent paroxysmal atrial fibrillation.  The natural history of rheumatic mitral valve disease was discussed.  Long-term prognosis with continued medical therapy without some type of surgical intervention were discussed. As a next step we discussed proceeding with TEE to consider whether or not an attempt at balloon mitral valvuloplasty might be feasible.  Surgical options were discussed including elective mitral valve replacement with or without concomitant Maze procedure.   We discussed the fact that with definitive surgery the patient will need valve replacement rather than valve repair.  Under the circumstances we discussed the possibility of replacing the mitral valve using a mechanical prosthesis with the attendant need for long-term anticoagulation using warfarin versus the alternative of replacing it using a bioprosthetic tissue valve with its potential for late structural valve deterioration and failure, depending upon the patient's longevity.  The patient specifically requests that if the mitral valve must be replaced that it be done using a mechanical valve.  Because of the patient's morbid obesity and baseline pulmonary hypertension, I would be very reluctant to consider minimally invasive approach for surgery in favor conventional median sternotomy.  Expectations for the patient's postoperative convalescence following uncomplicated elective surgery were reviewed.  All of their questions have been addressed.  The patient will return to our office in approximately 2 to 3 weeks once transesophageal echocardiogram has been performed.    I spent in excess of 15 minutes during the conduct of this office consultation and >50% of this time involved direct face-to-face encounter with the patient for counseling and/or coordination of their care.    Valentina Gu. Leslie Manns, MD 03/10/2020 1:12 PM

## 2020-03-10 NOTE — Patient Instructions (Signed)
Continue all previous medications without any changes at this time  

## 2020-03-10 NOTE — Progress Notes (Signed)
My next date is 3/28. Unless someone can do sooner, I will get her scheduled. -W

## 2020-03-11 ENCOUNTER — Ambulatory Visit (HOSPITAL_COMMUNITY): Payer: Dental | Admitting: Dentistry

## 2020-03-11 VITALS — BP 140/50 | HR 63 | Temp 98.2°F

## 2020-03-11 DIAGNOSIS — K08199 Complete loss of teeth due to other specified cause, unspecified class: Secondary | ICD-10-CM

## 2020-03-11 NOTE — Progress Notes (Signed)
DENTAL VISIT  POSTOPERATIVE NOTE   Date:   03/11/2020 Patient Name:  Leslie [name] MRN:   [id number]   COVID 19 SCREENING: The patient does not symptoms concerning for COVID-19 infection (Including fever, chills, cough, or new SHORTNESS OF BREATH).     VITALS: BP (!) 140/50 (BP Location: Right Arm)   Pulse 63   Temp 98.2 F (36.8 C)    CHIEF COMPLAINT: "I was a little sore after my surgery, but I am doing pretty good now."  . Patient presents today for a post-op visit s/p extractions of teeth numbers 2, 4, 5 and 18 in the OR on 2/10.  She is anticipating heart valve surgery. . Medical and dental history reviewed with the patient.  No changes reported.   Patient Active Problem List   Diagnosis Date Noted  . COPD exacerbation ([place]) 01/30/2020  . Anticoagulated 01/30/2020  . Tightness in chest 01/02/2020  . Encounter for long-term (current) use of high-risk medication 01/02/2020  . BMI 39.0-39.9,adult 09/30/2019  . COVID-19 virus infection 09/09/2019  . Unstable angina ([place]) 09/06/2019  . 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 Encounter for general adult medical examination with abnormal findings 02/21/2019  . Oral candidiasis 02/21/2019  . Acute kidney failure, unspecified ([place]) 12/09/2018  . Right knee pain 11/27/2018  . GERD (gastroesophageal reflux disease) 10/25/2018  . CKD (chronic kidney disease), stage III ([place]) 10/25/2018  . CHF (congestive heart failure) ([place]) 10/25/2018  . Pyelonephritis 10/01/2018  . Absolute anemia 10/01/2018  . Iron deficiency anemia secondary to blood loss (chronic)   . Polyp of descending colon   . Acute on chronic heart failure with preserved ejection fraction (HFpEF) ([place])   . AKI (acute kidney injury) ([place])   . Sepsis ([place]) 08/26/2018  . (HFpEF) heart failure with preserved ejection fraction ([place])   . Renal calculus, right 07/03/2018  . Low back pain 06/27/2018  . Bacterial vaginitis 06/27/2018  . Pedal edema 06/27/2018  .  Coronary artery disease, non-occlusive 04/10/2018  . Acute on chronic diastolic CHF (congestive heart failure) ([place]) 04/10/2018  . Pre-operative clearance 03/16/2018  . Community acquired pneumonia 02/16/2018  . Elevated brain natriuretic peptide (BNP) level 02/16/2018  . Abnormal renal function 02/16/2018  . Acute recurrent pansinusitis 01/13/2018  . Candidiasis 01/13/2018  . Anxiety 01/13/2018  . Osteoarthritis of knee 01/09/2018  . Morbid obesity with body mass index (BMI) of 40.0 or higher ([place]) 11/13/2017  . Major depressive disorder 09/22/2017  . Mitral valve stenosis   . Atrial fibrillation ([place]) 09/11/2017  . Generalized abdominal pain 09/08/2017  . Episode of recurrent major depressive disorder ([place]) 09/08/2017  . Hypertension 06/06/2017  . Cough 06/06/2017  . Tobacco dependency 06/06/2017  . Urinary tract infection with hematuria 04/28/2017  . Acute upper respiratory infection 04/28/2017  . Flu-like symptoms 04/28/2017  . Dysuria 04/28/2017  . Vaginal candidiasis 04/28/2017  . Pain of left lower extremity 02/11/2016  . 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 Fibrocystic breast disease 03/23/2013  . Family history of breast cancer 07/20/2012   Past Medical History:  Diagnosis Date  . (HFpEF) heart failure with preserved ejection fraction ([place])    a. 08/2017 Echo: EF 55-60%.  Grade 2 diastolic dysfunction.  Moderate mitral stenosis.  . Allergy   . Anemia   . Anxiety   . BRCA negative 03/22/2013  . Bronchitis 02/19/2016   ON LEVAQUIN PO  . Chest tightness   . Cigarette smoker 09/11/2017   8-10 day  .  Dyspnea   . GERD (gastroesophageal reflux disease)   . History of kidney stones   . Hyperlipidemia   . Hypertension   . Interstitial lung disease ([name])    a. CT 2013 b. 02/2018 CXR noted recurrent intersistial changes ILD vs chronic bronchitis  . Moderate mitral stenosis    a.  08/2017 TEE: EF 60 to 65%.  Moderate mitral stenosis.  Mean gradient 14  mmHg.  Valve area 2.59 cm by planimetry, 2.72 cm by pressure half-time.  . Persistent atrial fibrillation ([place])    a. 08/2017 s/p TEE/DCCV; b. CHA2DS2VASc = 2-->warfarin.   Past Surgical History:  Procedure Laterality Date  . ABDOMINAL HYSTERECTOMY     total  . ABDOMINAL HYSTERECTOMY    . BREAST BIOPSY Bilateral 2012  . BREAST BIOPSY Right 08-07-12   fibroadenomatous changes and columnar cells  . BREAST BIOPSY  02/03/2015   stereo byrnett  . CARDIAC CATHETERIZATION    . CHOLECYSTECTOMY N/A 02/27/2016   Procedure: LAPAROSCOPIC CHOLECYSTECTOMY;  Surgeon: Christene Lye, MD;  Location: [facility];  Service: General;  Laterality: N/A;  . COLONOSCOPY WITH PROPOFOL N/A 09/26/2018   Procedure: COLONOSCOPY WITH PROPOFOL;  Surgeon: Lucilla Lame, MD;  Location: [facility] ENDOS[facility];  Service: Endoscopy;  Laterality: N/A;  . CYSTOSCOPY W/ RETROGRADES Right 08/18/2018   Procedure: CYSTOSCOPY WITH RETROGRADE PYELOGRAM;  Surgeon: Billey Co, MD;  Location: [facility];  Service: Urology;  Laterality: Right;  . CYSTOSCOPY/URETEROSCOPY/HOLMIUM LASER/STENT PLACEMENT Right 08/18/2018   Procedure: CYSTOSCOPY/URETEROSCOPY/STENT PLACEMENT;  Surgeon: Billey Co, MD;  Location: [facility];  Service: Urology;  Laterality: Right;  . ESOPHAGOGASTRODUODENOSCOPY (EGD) WITH PROPOFOL N/A 09/26/2018   Procedure: ESOPHAGOGASTRODUODENOSCOPY (EGD) WITH PROPOFOL;  Surgeon: Lucilla Lame, MD;  Location: [facility];  Service: Endoscopy;  Laterality: N/A;  . GIVENS CAPSULE STUDY N/A 11/03/2018   Procedure: GIVENS CAPSULE STUDY;  Surgeon: Lucilla Lame, MD;  Location: [facility] ENDOSCOPY;  Service: Endoscopy;  Laterality: N/A;  . JOINT REPLACEMENT Left    knee  . KNEE ARTHROPLASTY Right 04/09/2019   Procedure: COMPUTER ASSISTED TOTAL KNEE ARTHROPLASTY;  Surgeon: Dereck Leep, MD;  Location: [facility];  Service: Orthopedics;  Laterality: Right;  . KNEE CLOSED REDUCTION Left 04/15/2015   Procedure: CLOSED MANIPULATION KNEE;   Surgeon: Thornton Park, MD;  Location: [facility];  Service: Orthopedics;  Laterality: Left;  . KNEE SURGERY    . MULTIPLE EXTRACTIONS WITH ALVEOLOPLASTY N/A 02/28/2020   Procedure: MULTIPLE EXTRACTION WITH ALVEOLOPLASTY;  Surgeon: [name], DMD;  Location: [facility];  Service: Dentistry;  Laterality: N/A;  . RIGHT/LEFT HEART CATH AND CORONARY ANGIOGRAPHY Bilateral 09/06/2019   Procedure: RIGHT/LEFT HEART CATH AND CORONARY ANGIOGRAPHY;  Surgeon: Minna Merritts, MD;  Location: [facility];  Service: Cardiovascular;  Laterality: Bilateral;  . TEE WITHOUT CARDIOVERSION N/A 09/13/2017   Procedure: TRANSESOPHAGEAL ECHOCARDIOGRAM (TEE);  Surgeon: Nelva Bush, MD;  Location: [facility];  Service: Cardiovascular;  Laterality: N/A;  . TOTAL KNEE ARTHROPLASTY Left 12/25/2014   Procedure: TOTAL KNEE ARTHROPLASTY;  Surgeon: Thornton Park, MD;  Location: [facility];  Service: Orthopedics;  Laterality: Left;  . TUBAL LIGATION     Current Outpatient Medications  Medication Sig Dispense Refill  . acetaminophen (TYLENOL) 500 MG tablet Take 1,000 mg by mouth every 6 (six) hours as needed for moderate pain or fever.    Marland Kitchen albuterol (VENTOLIN HFA) 108 (90 Base) MCG/ACT inhaler Inhale 2 puffs into the lungs every 6 (six) hours as needed for wheezing or shortness of breath. 8 g  2  . ALPRAZolam (XANAX) 0.5 MG tablet Take 1 tablet (0.5 mg total) by mouth at bedtime as needed for anxiety. 30 tablet 0  . atorvastatin (LIPITOR) 80 MG tablet TAKE 1 TABLET BY MOUTH EVERY DAY AT 6 PM (Patient taking differently: Take 80 mg by mouth daily.) 90 tablet 1  . calcium carbonate (TUMS - DOSED IN MG ELEMENTAL CALCIUM) 500 MG chewable tablet Chew 500 mg by mouth daily as needed for indigestion or heartburn.    . diltiazem (CARDIZEM CD) 120 MG 24 hr capsule TAKE 1 CAPSULE(120 MG) BY MOUTH DAILY (Patient taking differently: Take 120 mg by mouth daily.) 90 capsule 1  . fluticasone (FLONASE) 50 MCG/ACT nasal spray Place 1  spray into both nostrils daily as needed for rhinitis.    . metoprolol succinate (TOPROL-XL) 50 MG 24 hr tablet TAKE 1 TABLET BY MOUTH EVERY MORNING AND 2 TABLETS BY MOUTH EVERY NIGHT AT BEDTIME (Patient taking differently: Take 50-100 mg by mouth See admin instructions. Take 50 mg in the morning and 100 mg at  bedtime) 270 tablet 1  . omeprazole (PRILOSEC) 40 MG capsule Take 1 capsule (40 mg total) by mouth daily. 90 capsule 1  . potassium chloride (KLOR-CON) 10 MEQ tablet Take 30 mg in the morning and 20 mg at bedtime 450 tablet 0  . torsemide (DEMADEX) 20 MG tablet TAKE 2 TABLETS(40 MG) BY MOUTH TWICE DAILY 360 tablet 3  . warfarin (COUMADIN) 2.5 MG tablet TAKE AS DIRECTED BY ANTI-COAG CLINIC (Patient taking differently: Take 1.25 mg by mouth daily at 4 PM.) 90 tablet 0   No current facility-administered medications for this visit.   Allergies  Allergen Reactions  . Morphine Hives and Rash  . Oxycodone Hcl Hives and Itching  . Dilaudid [Hydromorphone Hcl] Itching    LABS: Lab Results  Component Value Date   WBC 6.5 03/07/2020   HGB 11.3 03/07/2020   HCT 33.7 (L) 03/07/2020   MCV 86 03/07/2020   PLT 447 03/07/2020   BMET    Component Value Date/Time   NA 140 03/07/2020 1039   NA 138 05/08/2014 1310   K 4.0 03/07/2020 1039   K 3.4 (L) 05/08/2014 1310   CL 98 03/07/2020 1039   CL 99 (L) 05/08/2014 1310   CO2 24 03/07/2020 1039   CO2 29 05/08/2014 1310   GLUCOSE 117 (H) 03/07/2020 1039   GLUCOSE 106 (H) 02/28/2020 0848   GLUCOSE 100 (H) 05/08/2014 1310   BUN 14 03/07/2020 1039   BUN 14 05/08/2014 1310   CREATININE 1.26 (H) 03/07/2020 1039   CREATININE 1.08 (H) 05/08/2014 1310   CALCIUM 9.2 03/07/2020 1039   CALCIUM 9.2 05/08/2014 1310   GFRNONAA 49 (L) 03/07/2020 1039   GFRNONAA 51 (L) 02/28/2020 0848   GFRNONAA >60 05/08/2014 1310   GFRAA 56 (L) 03/07/2020 1039   GFRAA >60 05/08/2014 1310   Lab Results  Component Value Date   INR 1.5 (H) 02/28/2020   INR 3.6 (A)  02/13/2020   INR 2.3 12/19/2019   No results found for: PTT    EXAM: Extraction sites appear to be healing WNL.  No signs of wound dehiscence or infection evident upon examination.  No sutures remain in-tact.   PROCEDURE: The patient was given a chlorhexidine gluconate rinse for 30 seconds. Extraction sites were all irrigated with sterile saline. Patient tolerated the procedure well.   ASSESSMENT: Postoperative course is consistent with dental procedures performed.   PLAN: 1. Follow-up as needed.  Patient is to call should any questions or issues arise. 2. Patient to return to her regular dentist for routine dental care including cleanings, exams and replacement of missing teeth.  Recommend that she discuss plans to return to her dentist for non-urgent treatment with MD to ensure they are medically optimized and to discuss pre-medication with antibiotic prophylaxis.  . All questions and concerns were addressed, and patient verbalized understanding of discussion and plan.   . Patient tolerated today's visit well and departed in stable condition.    Craig Benson [place], DMD

## 2020-03-12 ENCOUNTER — Other Ambulatory Visit: Payer: Self-pay

## 2020-03-12 ENCOUNTER — Ambulatory Visit (INDEPENDENT_AMBULATORY_CARE_PROVIDER_SITE_OTHER): Payer: 59

## 2020-03-12 DIAGNOSIS — Z5181 Encounter for therapeutic drug level monitoring: Secondary | ICD-10-CM | POA: Diagnosis not present

## 2020-03-12 DIAGNOSIS — I4891 Unspecified atrial fibrillation: Secondary | ICD-10-CM | POA: Diagnosis not present

## 2020-03-12 DIAGNOSIS — I050 Rheumatic mitral stenosis: Secondary | ICD-10-CM | POA: Diagnosis not present

## 2020-03-12 LAB — POCT INR: INR: 1.4 — AB (ref 2.0–3.0)

## 2020-03-12 NOTE — Progress Notes (Signed)
Labs reviewed, will need follow-up appointment.

## 2020-03-12 NOTE — Patient Instructions (Signed)
-   take 2 tablets warfarin tonight and tomorrow, then  - continue warfarin dosage of 1.25 mg (half the 2.5 mg tablet) every day.   - recheck INR in 4 weeks

## 2020-03-13 ENCOUNTER — Ambulatory Visit: Payer: 59 | Admitting: Internal Medicine

## 2020-03-16 NOTE — Progress Notes (Signed)
Can we reach out to patient, Is Leslie [name] arranging TEE as discussed with Dr. Despina Hick mitral valve stenosis) Or are we doing TEE in [place]? Thx TG

## 2020-03-17 ENCOUNTER — Telehealth: Payer: Self-pay

## 2020-03-17 NOTE — Telephone Encounter (Signed)
Was able to reach Leslie [name] regarding her recent appt with Dr. Roxy Manns last Monday, as requested by Dr. Rockey Situ, Dr. Maurie Boettcher and pt had discuss about having a TEE then a f/u appt with Dr. Roxy Manns 2-3 weeks after the procedure. Leslie [name] reports no one has been in contact with her about scheduling the TEE. Advised Leslie [name] I will reach out to Dr. Guy Sandifer office to see what the plan is for scheduling, pt prefers to have TEE schedule in [place].

## 2020-03-17 NOTE — Telephone Encounter (Signed)
-----   Message from Wynema Birch, RN sent at 03/17/2020  8:59 AM EST ----- Regarding: TEE [place] Nita Sells, I hope I'm correct that you follow Dr. Audie Box at [facility] and [place] you help with scheduling.  Was following up on Mrs. Gosser, she seen Dr. Roxy Manns last Monday and they decided on a TEE. Dr. Roxy Manns reached out to Dr. Farris Has to have it schedule, below lI copied his response. I called mrs. Cantrelle and she stated no one has contacted her regarding the TEE schedule time. She also needs a 2- week f/u with Dr. Roxy Manns after the procedure.  Can you please check into this and let me know when this will be schedule for pt so I may update Dr. Rockey Situ. Thanks so much Mandie, RN  Progress Notes by Geralynn Rile, MD at 03/10/2020 1:00 PM Version 1 of 1 Author: Geralynn Rile, MD Service: Cardiology Author Type: Physician Filed: 03/10/2020 2:17 PM Encounter Date: 03/10/2020 Status: Signed Editor: Geralynn Rile, MD (Physician)    My next date is 3/28. Unless someone can do sooner, I will get her scheduled. -W    ----- Message ----- From: Minna Merritts, MD Sent: 03/16/2020   6:57 PM EST To: Wynema Birch, RN    ----- Message ----- From: Rexene Alberts, MD Sent: 03/10/2020   1:43 PM EST To: Ronnell Freshwater, NP, Minna Merritts, MD

## 2020-03-17 NOTE — Telephone Encounter (Signed)
I attempted to contact patient to get her scheduled for TEE.  Unable to reach her, LVM to call back to see what day would work best for her to get it completed. Will review instructions at call back.   Thanks!

## 2020-03-18 ENCOUNTER — Other Ambulatory Visit: Payer: Self-pay

## 2020-03-18 DIAGNOSIS — I050 Rheumatic mitral stenosis: Secondary | ICD-10-CM

## 2020-03-19 ENCOUNTER — Telehealth: Payer: Self-pay

## 2020-03-19 DIAGNOSIS — Z01812 Encounter for preprocedural laboratory examination: Secondary | ICD-10-CM

## 2020-03-19 NOTE — Telephone Encounter (Signed)
Called patient to get patient scheduled for TEE on 3/15 with Dr. Margaretann Loveless.   Patient is agreeable to this. TEE scheduled for 04/01/20 with Dr. Margaretann Loveless at 10:30am. Advised patient to arrive at the hospital at [facility] in case labs have to be drawn prior to procedure.   Advised patient that a Covid test will be needed prior to this procedure. Patient would like Covid Test done at [facility] testing site. Advised patient that I have attempted to call to get Covid Test scheduled twice and have been unsuccessful. I have left message for them to call back to get patients Covid Test Scheduled.  Advised patient we would try and call back tomorrow to get her Covid Test Scheduled.   Lab Orders placed for patient to have labs drawn on 3/14 at [facility]. Patient advised that  check in at [facility] at [facility] for lab work.   Went over TEE instructions for patient.  Patient verbalized understanding of all instructions.

## 2020-03-20 ENCOUNTER — Encounter: Payer: Self-pay | Admitting: Hospice and Palliative Medicine

## 2020-03-20 ENCOUNTER — Ambulatory Visit (INDEPENDENT_AMBULATORY_CARE_PROVIDER_SITE_OTHER): Payer: 59 | Admitting: Hospice and Palliative Medicine

## 2020-03-20 VITALS — BP 132/64 | HR 94 | Temp 97.3°F | Resp 16 | Ht 64.0 in | Wt 248.6 lb

## 2020-03-20 DIAGNOSIS — R319 Hematuria, unspecified: Secondary | ICD-10-CM | POA: Diagnosis not present

## 2020-03-20 DIAGNOSIS — I050 Rheumatic mitral stenosis: Secondary | ICD-10-CM | POA: Diagnosis not present

## 2020-03-20 DIAGNOSIS — R109 Unspecified abdominal pain: Secondary | ICD-10-CM | POA: Diagnosis not present

## 2020-03-20 DIAGNOSIS — R300 Dysuria: Secondary | ICD-10-CM

## 2020-03-20 DIAGNOSIS — B3731 Acute candidiasis of vulva and vagina: Secondary | ICD-10-CM

## 2020-03-20 DIAGNOSIS — J849 Interstitial pulmonary disease, unspecified: Secondary | ICD-10-CM

## 2020-03-20 DIAGNOSIS — B373 Candidiasis of vulva and vagina: Secondary | ICD-10-CM

## 2020-03-20 LAB — POCT URINALYSIS DIPSTICK
Bilirubin, UA: NEGATIVE
Glucose, UA: NEGATIVE
Ketones, UA: NEGATIVE
Leukocytes, UA: NEGATIVE
Nitrite, UA: NEGATIVE
Protein, UA: NEGATIVE
Spec Grav, UA: 1.01 (ref 1.010–1.025)
Urobilinogen, UA: 0.2 E.U./dL
pH, UA: 6.5 (ref 5.0–8.0)

## 2020-03-20 MED ORDER — TRAMADOL HCL 50 MG PO TABS: 50.0000 mg | ORAL_TABLET | Freq: Three times a day (TID) | ORAL | 0 refills | Status: AC | PRN

## 2020-03-20 MED ORDER — FLUCONAZOLE 150 MG PO TABS: ORAL_TABLET | ORAL | 0 refills | Status: DC

## 2020-03-20 NOTE — Telephone Encounter (Signed)
Called [facility] covid testing center- they were given patients information, and they advised me that she can go anytime on 03/11 until 1 PM (they close everyday at 1)  She states they have her information and will have her taken care of.   I called patient and advised of covid date, and when she needed to go- she verbalized understanding.

## 2020-03-20 NOTE — Progress Notes (Signed)
[facility] [place], [place] [address]  Internal MEDICINE  Office Visit Note  Patient Name: Leslie [name]  [id number]  [id number]  Date of Service: 03/23/2020  Chief Complaint  Patient presents with  . Follow-up    Discuss labs, back pain for 2 weeks, urinary frequency, urgency, pain, itch  . Hyperlipidemia  . Hypertension  . Gastroesophageal Reflux  . Anemia  . Anxiety  . Quality Metric Gaps    Mammogram scheduled 03-21-20    HPI Patient is here for routine follow-up Since our last visit she has had three teeth removed prior to undergoing further testing for mitral valve stenosis Followed by cardiology for MV stenosis, A. 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 Humphrey Rolls next week to establish care  Has a history of renal stones, last week her low back has started to be painful again which is an indicator to her due to previous history that she may have a recurring stone No further urinary symptoms Does report vaginal itching and thick white discharge--recently completed course of antibiotics and frequently gets vaginal yeast infections after completing antibiotic therapy  Current Medication: Outpatient Encounter Medications as of 03/20/2020  Medication Sig  . fluconazole (DIFLUCAN) 150 MG tablet Take 1 tablet by mouth for 1 dose, may repeat dosing in 3 days for recurring symptoms.  . traMADol (ULTRAM) 50 MG tablet Take 1 tablet (50 mg total) by mouth every 8 (eight) hours as needed for up to 5 days.  Marland Kitchen acetaminophen (TYLENOL) 500 MG tablet Take 1,000 mg by mouth every 6 (six) hours as needed for moderate pain or fever.  Marland Kitchen albuterol (VENTOLIN HFA) 108 (90 Base)  MCG/ACT inhaler Inhale 2 puffs into the lungs every 6 (six) hours as needed for wheezing or shortness of breath.  . ALPRAZolam (XANAX) 0.5 MG tablet Take 1 tablet (0.5 mg total) by mouth at bedtime as needed for anxiety.  Marland Kitchen amoxicillin (AMOXIL) 500 MG tablet Take 2,000 mg by mouth See admin instructions. Take 4 capsules (2000 mg) by mouth 1 hour prior to dental work  . atorvastatin (LIPITOR) 80 MG tablet TAKE 1 TABLET BY MOUTH EVERY DAY AT 6 PM (Patient taking differently: Take 80 mg by mouth every evening.)  . calcium carbonate (TUMS - DOSED IN MG ELEMENTAL CALCIUM) 500 MG chewable tablet Chew 500 mg by mouth daily as needed for indigestion or heartburn.  . diltiazem (CARDIZEM CD) 120 MG 24 hr capsule TAKE 1 CAPSULE(120 MG) BY MOUTH DAILY (Patient taking differently: Take 120 mg by mouth daily.)  . fluticasone (FLONASE) 50 MCG/ACT nasal spray Place 1 spray into both nostrils daily as needed for rhinitis.  . metoprolol succinate (TOPROL-XL) 50 MG 24 hr tablet TAKE 1 TABLET BY MOUTH EVERY MORNING AND 2 TABLETS BY MOUTH EVERY NIGHT AT BEDTIME (Patient taking differently: Take 50-100 mg by mouth See admin instructions. Take 1 tablet (50 mg) by mouth in the morning and take 2 tablets (100 mg) at bedtime)  . omeprazole (PRILOSEC) 40 MG capsule Take 1 capsule (40 mg total) by mouth daily.  . potassium chloride (KLOR-CON) 10 MEQ tablet Take 30 mg in the morning and 20 mg at bedtime (Patient taking differently: Take 20-30 mEq by mouth See admin  instructions. Take 3 tablets (30 meq) by mouth in the morning & take 2 tablets (20 meq) by mouth in the evening)  . torsemide (DEMADEX) 20 MG tablet TAKE 2 TABLETS(40 MG) BY MOUTH TWICE DAILY (Patient taking differently: Take 40 mg by mouth in the morning and at bedtime.)  . warfarin (COUMADIN) 2.5 MG tablet TAKE AS DIRECTED BY ANTI-COAG CLINIC (Patient taking differently: Take 1.25 mg by mouth daily at 4 PM.)   No facility-administered encounter medications on file as  of 03/20/2020.    Surgical History: Past Surgical History:  Procedure Laterality Date  . ABDOMINAL HYSTERECTOMY     total  . ABDOMINAL HYSTERECTOMY    . BREAST BIOPSY Bilateral 2012  . BREAST BIOPSY Right 08-07-12   fibroadenomatous changes and columnar cells  . BREAST BIOPSY  02/03/2015   stereo byrnett  . CARDIAC CATHETERIZATION    . CHOLECYSTECTOMY N/A 02/27/2016   Procedure: LAPAROSCOPIC CHOLECYSTECTOMY;  Surgeon: Christene Lye, MD;  Location: [facility];  Service: General;  Laterality: N/A;  . COLONOSCOPY WITH PROPOFOL N/A 09/26/2018   Procedure: COLONOSCOPY WITH PROPOFOL;  Surgeon: Lucilla Lame, MD;  Location: [facility];  Service: Endoscopy;  Laterality: N/A;  . CYSTOSCOPY W/ RETROGRADES Right 08/18/2018   Procedure: CYSTOSCOPY WITH RETROGRADE PYELOGRAM;  Surgeon: Billey Co, MD;  Location: [facility];  Service: Urology;  Laterality: Right;  . CYSTOSCOPY/URETEROSCOPY/HOLMIUM LASER/STENT PLACEMENT Right 08/18/2018   Procedure: CYSTOSCOPY/URETEROSCOPY/STENT PLACEMENT;  Surgeon: Billey Co, MD;  Location: [facility];  Service: Urology;  Laterality: Right;  . ESOPHAGOGASTRODUODENOSCOPY (EGD) WITH PROPOFOL N/A 09/26/2018   Procedure: ESOPHAGOGASTRODUODENOSCOPY (EGD) WITH PROPOFOL;  Surgeon: Lucilla Lame, MD;  Location: [facility];  Service: Endoscopy;  Laterality: N/A;  . GIVENS CAPSULE STUDY N/A 11/03/2018   Procedure: GIVENS CAPSULE STUDY;  Surgeon: Lucilla Lame, MD;  Location: [facility];  Service: Endoscopy;  Laterality: N/A;  . JOINT REPLACEMENT Left    knee  . KNEE ARTHROPLASTY Right 04/09/2019   Procedure: COMPUTER ASSISTED TOTAL KNEE ARTHROPLASTY;  Surgeon: Dereck Leep, MD;  Location: [facility];  Service: Orthopedics;  Laterality: Right;  . KNEE CLOSED REDUCTION Left 04/15/2015   Procedure: CLOSED MANIPULATION KNEE;  Surgeon: Thornton Park, MD;  Location: [facility];  Service: Orthopedics;  Laterality: Left;  . KNEE SURGERY    . MULTIPLE EXTRACTIONS WITH  ALVEOLOPLASTY N/A 02/28/2020   Procedure: MULTIPLE EXTRACTION WITH ALVEOLOPLASTY;  Surgeon: [name], DMD;  Location: [facility];  Service: Dentistry;  Laterality: N/A;  . RIGHT/LEFT HEART CATH AND CORONARY ANGIOGRAPHY Bilateral 09/06/2019   Procedure: RIGHT/LEFT HEART CATH AND CORONARY ANGIOGRAPHY;  Surgeon: Minna Merritts, MD;  Location: [place] [facility];  Service: Cardiovascular;  Laterality: Bilateral;  . TEE WITHOUT CARDIOVERSION N/A 09/13/2017   Procedure: TRANSESOPHAGEAL ECHOCARDIOGRAM (TEE);  Surgeon: Nelva Bush, MD;  Location: [facility];  Service: Cardiovascular;  Laterality: N/A;  . TOTAL KNEE ARTHROPLASTY Left 12/25/2014   Procedure: TOTAL KNEE ARTHROPLASTY;  Surgeon: Thornton Park, MD;  Location: [facility];  Service: Orthopedics;  Laterality: Left;  . TUBAL LIGATION      Medical History: Past Medical History:  Diagnosis Date  . (HFpEF) heart failure with preserved ejection fraction ([place])    a. 08/2017 Echo: EF 55-60%.  Grade 2 diastolic dysfunction.  Moderate mitral stenosis.  . Allergy   . Anemia   . Anxiety   . BRCA negative 03/22/2013  . Bronchitis 02/19/2016   ON LEVAQUIN PO  . Chest tightness   . Cigarette smoker 09/11/2017  8-10 day  . Dyspnea   . GERD (gastroesophageal reflux disease)   . History of kidney stones   . Hyperlipidemia   . Hypertension   . Interstitial lung disease ([place])    a. CT 2013 b. 02/2018 CXR noted recurrent intersistial changes ILD vs chronic bronchitis  . Moderate mitral stenosis    a.  08/2017 TEE: EF 60 to 65%.  Moderate mitral stenosis.  Mean gradient 14 mmHg.  Valve area 2.59 cm by planimetry, 2.72 cm by pressure half-time.  . Persistent atrial fibrillation ([place])    a. 08/2017 s/p TEE/DCCV; b. CHA2DS2VASc = 2-->warfarin.    Family History: Family History  Problem Relation Age of Onset  . Cancer Mother 86       breast  . Hypertension Mother   . Cancer Maternal Aunt        breast  . Cancer Maternal Grandmother         breast  . Osteoarthritis Father   . Hypertension Father     Social History   Socioeconomic History  . Marital status: Married    Spouse name: Not on file  . Number of children: Not on file  . Years of education: Not on file  . Highest education level: Not on file  Occupational History  . Not on file  Tobacco Use  . Smoking status: Former Smoker    Packs/day: 0.50    Years: 20.00    Pack years: 10.00    Types: Cigarettes    Quit date: 02/20/2018    Years since quitting: 2.0  . Smokeless tobacco: Never Used  Vaping Use  . Vaping Use: Never used  Substance and Sexual Activity  . Alcohol use: Yes    Comment: rarely  . Drug use: No  . Sexual activity: Not on file  Other Topics Concern  . Not on file  Social History Narrative  . Not on file   Social Determinants of Health   Financial Resource Strain: Not on file  Food Insecurity: Not on file  Transportation Needs: Not on file  Physical Activity: Not on file  Stress: Not on file  Social Connections: Not on file  Intimate Partner Violence: Not on file      Review of Systems  Constitutional: Negative for chills, diaphoresis and fatigue.  HENT: Negative for ear pain, postnasal drip and sinus pressure.   Eyes: Negative for photophobia, discharge, redness, itching and visual disturbance.  Respiratory: Positive for shortness of breath. Negative for cough and wheezing.   Cardiovascular: Positive for palpitations. Negative for chest pain and leg swelling.  Gastrointestinal: Negative for abdominal pain, constipation, diarrhea, nausea and vomiting.  Genitourinary: Positive for vaginal discharge. Negative for dysuria and flank pain.       Vaginal itching  Musculoskeletal: Positive for back pain. Negative for arthralgias, gait problem and neck pain.  Skin: Negative for color change.  Allergic/Immunologic: Negative for environmental allergies and food allergies.  Neurological: Negative for dizziness and headaches.   Hematological: Does not bruise/bleed easily.  Psychiatric/Behavioral: Negative for agitation, behavioral problems (depression) and hallucinations.    Vital Signs: BP 132/64   Pulse 94   Temp (!) 97.3 F (36.3 C)   Resp 16   Ht _0  (1.626 m)   Wt 248 lb 9.6 oz (112.8 kg)   SpO2 98%   BMI 42.67 kg/m    Physical Exam Constitutional:      Appearance: Normal appearance. She is obese.  Cardiovascular:     Rate and  Rhythm: Normal rate and regular rhythm.     Pulses: Normal pulses.     Heart sounds: Normal heart sounds.  Pulmonary:     Effort: Pulmonary effort is normal.     Breath sounds: Normal breath sounds.  Abdominal:     General: Abdomen is flat.     Palpations: Abdomen is soft.     Tenderness: There is right CVA tenderness and left CVA tenderness.  Musculoskeletal:        General: Normal range of motion.     Cervical back: Normal range of motion.  Skin:    General: Skin is warm.  Neurological:     General: No focal deficit present.     Mental Status: She is alert and oriented to person, place, and time. Mental status is at baseline.  Psychiatric:        Mood and Affect: Mood normal.        Behavior: Behavior normal.        Thought Content: Thought content normal.        Judgment: Judgment normal.    Assessment/Plan: 1. Hematuria, unspecified type Hematuria, CVA tenderness, history of renal stones--high suspicion for recurrent stones, will await [place] results and treat as indicated - US Renal; Future  2. Bilateral flank pain Bilateral CVA tenderness, hematuria and history of renal stones High suspicion for renal stone reoccurrence--treat acute severe pain with tramadol as needed--will await renal US results  - traMADol (ULTRAM) 50 MG tablet; Take 1 tablet (50 mg total) by mouth every 8 (eight) hours as needed for up to 5 days.  Dispense: 15 tablet; Refill: 0  3. ILD (interstitial lung disease) ([place]) Scheduled to establish care with pulmonology next week, will  look into why PFT was not performed  4. Mitral valve stenosis, unspecified etiology Followed by cardiology, scheduled for TEE next week  5. Vaginal candidiasis Treat symptoms with Diflucan - fluconazole (DIFLUCAN) 150 MG tablet; Take 1 tablet by mouth for 1 dose, may repeat dosing in 3 days for recurring symptoms.  Dispense: 3 tablet; Refill: 0  6. Dysuria Hematuria noted - POCT Urinalysis Dipstick  General Counseling: Leslie [name] verbalizes understanding of the findings of todays visit and agrees with plan of treatment. I have discussed any further diagnostic evaluation that may be needed or ordered today. We also reviewed her medications today. she has been encouraged to call the office with any questions or concerns that should arise related to todays visit.    Orders Placed This Encounter  Procedures  . US Renal  . POCT Urinalysis Dipstick    Meds ordered this encounter  Medications  . fluconazole (DIFLUCAN) 150 MG tablet    Sig: Take 1 tablet by mouth for 1 dose, may repeat dosing in 3 days for recurring symptoms.    Dispense:  3 tablet    Refill:  0  . traMADol (ULTRAM) 50 MG tablet    Sig: Take 1 tablet (50 mg total) by mouth every 8 (eight) hours as needed for up to 5 days.    Dispense:  15 tablet    Refill:  0    Time spent: 30 Minutes Time spent includes review of chart, medications, test results and follow-up plan with the patient.  This patient was seen by Theodoro Grist AGNP-C in Collaboration with Dr Lavera Guise as a part of collaborative care agreement     Tanna Furry. Kyrsten Deleeuw AGNP-C Internal medicine

## 2020-03-20 NOTE — Telephone Encounter (Signed)
I called over to [facility] covid testing to get her scheduled- had to leave another message, did advise that it was important to have her scheduled by today and to give me a call back. I will continue to call until I have an answer or responding call back.   Left call back number.

## 2020-03-23 ENCOUNTER — Encounter: Payer: Self-pay | Admitting: Hospice and Palliative Medicine

## 2020-03-24 ENCOUNTER — Ambulatory Visit
Admission: RE | Admit: 2020-03-24 | Discharge: 2020-03-24 | Disposition: A | Discharge status: Home / Self Care | Payer: 59 | Source: Ambulatory Visit | Attending: Nephrology | Admitting: Nephrology

## 2020-03-24 ENCOUNTER — Other Ambulatory Visit: Payer: Self-pay | Admitting: Nephrology

## 2020-03-24 ENCOUNTER — Ambulatory Visit
Admission: RE | Admit: 2020-03-24 | Discharge: 2020-03-24 | Disposition: A | Discharge status: Home / Self Care | Payer: 59 | Attending: Nephrology | Admitting: Nephrology

## 2020-03-24 ENCOUNTER — Telehealth: Payer: Self-pay

## 2020-03-24 ENCOUNTER — Other Ambulatory Visit: Payer: Self-pay

## 2020-03-24 DIAGNOSIS — R1031 Right lower quadrant pain: Secondary | ICD-10-CM | POA: Diagnosis not present

## 2020-03-24 DIAGNOSIS — N200 Calculus of kidney: Secondary | ICD-10-CM | POA: Insufficient documentation

## 2020-03-24 NOTE — Telephone Encounter (Signed)
Left a detailed message advising pt of ultrasound appt. Kirstina Leinweber

## 2020-03-25 ENCOUNTER — Other Ambulatory Visit: Payer: Self-pay | Admitting: Nephrology

## 2020-03-25 ENCOUNTER — Ambulatory Visit: Admission: RE | Admit: 2020-03-25 | Payer: 59 | Source: Ambulatory Visit

## 2020-03-25 ENCOUNTER — Ambulatory Visit: Payer: 59 | Admitting: Internal Medicine

## 2020-03-25 DIAGNOSIS — R1011 Right upper quadrant pain: Secondary | ICD-10-CM

## 2020-03-25 DIAGNOSIS — N200 Calculus of kidney: Secondary | ICD-10-CM

## 2020-03-26 ENCOUNTER — Other Ambulatory Visit: Payer: Self-pay

## 2020-03-26 ENCOUNTER — Other Ambulatory Visit (HOSPITAL_COMMUNITY): Payer: 59

## 2020-03-26 ENCOUNTER — Ambulatory Visit
Admission: RE | Admit: 2020-03-26 | Discharge: 2020-03-26 | Disposition: A | Discharge status: Home / Self Care | Payer: 59 | Source: Ambulatory Visit | Attending: Nephrology | Admitting: Nephrology

## 2020-03-26 ENCOUNTER — Other Ambulatory Visit
Admission: RE | Admit: 2020-03-26 | Discharge: 2020-03-26 | Disposition: A | Discharge status: Home / Self Care | Payer: 59 | Source: Home / Self Care | Attending: Nephrology | Admitting: Nephrology

## 2020-03-26 DIAGNOSIS — N200 Calculus of kidney: Secondary | ICD-10-CM | POA: Insufficient documentation

## 2020-03-26 DIAGNOSIS — R1011 Right upper quadrant pain: Secondary | ICD-10-CM | POA: Diagnosis present

## 2020-03-26 LAB — RENAL FUNCTION PANEL
Albumin: 4.1 g/dL (ref 3.5–5.0)
Anion gap: 10 (ref 5–15)
BUN: 17 mg/dL (ref 6–20)
CO2: 30 mmol/L (ref 22–32)
Calcium: 8.8 mg/dL — ABNORMAL LOW (ref 8.9–10.3)
Chloride: 99 mmol/L (ref 98–111)
Creatinine, Ser: 1.18 mg/dL — ABNORMAL HIGH (ref 0.44–1.00)
GFR, Estimated: 55 mL/min — ABNORMAL LOW (ref >60)
Glucose, Bld: 109 mg/dL — ABNORMAL HIGH (ref 70–99)
Phosphorus: 3.3 mg/dL (ref 2.5–4.6)
Potassium: 3.5 mmol/L (ref 3.5–5.1)
Sodium: 139 mmol/L (ref 135–145)

## 2020-03-26 NOTE — Addendum Note (Signed)
Addended by: Santiago Bur on: 03/26/2020 11:22 AM   Modules accepted: Orders

## 2020-03-27 ENCOUNTER — Ambulatory Visit (INDEPENDENT_AMBULATORY_CARE_PROVIDER_SITE_OTHER): Payer: 59 | Admitting: Urology

## 2020-03-27 ENCOUNTER — Encounter: Payer: Self-pay | Admitting: Urology

## 2020-03-27 VITALS — BP 143/77 | HR 71 | Ht 64.0 in | Wt 244.0 lb

## 2020-03-27 DIAGNOSIS — R109 Unspecified abdominal pain: Secondary | ICD-10-CM

## 2020-03-27 DIAGNOSIS — N200 Calculus of kidney: Secondary | ICD-10-CM

## 2020-03-27 LAB — URINALYSIS, COMPLETE
Bilirubin, UA: NEGATIVE
Glucose, UA: NEGATIVE
Ketones, UA: NEGATIVE
Leukocytes,UA: NEGATIVE
Nitrite, UA: NEGATIVE
Protein,UA: NEGATIVE
Specific Gravity, UA: 1.015 (ref 1.005–1.030)
Urobilinogen, Ur: 0.2 mg/dL (ref 0.2–1.0)
pH, UA: 7 (ref 5.0–7.5)

## 2020-03-27 LAB — MICROSCOPIC EXAMINATION

## 2020-03-27 NOTE — Progress Notes (Signed)
   03/27/2020 3:08 PM   Tinsleigh L Stuck [birth year] [id number]  Reason for visit: Right parenchymal stone  HPI: I saw Ms. Buescher in urology clinic for a right parenchymal stone.  She has an interesting urologic history and that she had intermittent right-sided flank pain with a calcification in the right upper pole with possible upstream calyceal dilation versus an intraparenchymal stone adjacent to a cyst.  With her constellation of symptoms, we opted to perform diagnostic right ureteroscopy.  She underwent surgery with me on 08/18/2018 and I was unable to identify any stones within the collecting system and there is no hydronephrosis, and the right parenchymal stone appeared to be intraparenchymal on a retrograde pyelogram.  Unfortunately this was complicated by postop UTI/sepsis and admission, but urology was not contacted at that time.  She then followed up with [facility] urology and ultimately underwent an additional right-sided diagnostic ureteroscopy with Dr. Aleene Davidson on 02/08/2019 which again showed no evidence of any stones within the collecting system, and the stone appeared to be intraparenchymal and not accessible via ureteroscopy.  I reviewed the outside operative notes from urology at [facility].   She has been doing well over the last year, but developed right-sided flank pain over the last 3 weeks.  She denies any urinary symptoms or gross hematuria.  She was referred over by Dr. Candiss Norse in nephrology who had ordered a KUB for right-sided flank pain on 03/25/2020 which showed a calculus overlying the projected location of the right kidney, and he also ordered a follow-up CT stone protocol completed yesterday.  I personally viewed and interpreted the CT which shows an unchanged 1 cm calcification in the right upper pole that appears to be intraparenchymal adjacent to a upper pole cyst, unchanged from prior CT in December 2020.  There is no hydronephrosis or ureteral stones.  We had a long conversation  about her complex history and findings.  As she has not had any pain over the last 1 to 2 years, and her CT is unchanged from December 2020, I do not think her right-sided pain is related to this intraparenchymal stone.  In addition she has had right-sided diagnostic ureteroscopy with both myself and a urologist at [facility] with no evidence of hydronephrosis, ureteral stone or identifiable renal stone.  We discussed conservative options for her flank pain including rest, Tylenol, physical therapy, ice/heat, and PCP follow-up for consideration of other evaluation or spinal imaging if she has no improvement over the next few days and weeks.    Billey Co, [facility] [address], [place], [place] [address] [phone]

## 2020-03-28 ENCOUNTER — Other Ambulatory Visit
Admission: RE | Admit: 2020-03-28 | Discharge: 2020-03-28 | Disposition: A | Discharge status: Home / Self Care | Payer: 59 | Source: Ambulatory Visit | Attending: Internal Medicine | Admitting: Internal Medicine

## 2020-03-28 ENCOUNTER — Other Ambulatory Visit: Payer: Self-pay

## 2020-03-28 DIAGNOSIS — Z20822 Contact with and (suspected) exposure to covid-19: Secondary | ICD-10-CM | POA: Insufficient documentation

## 2020-03-28 DIAGNOSIS — Z01812 Encounter for preprocedural laboratory examination: Secondary | ICD-10-CM | POA: Insufficient documentation

## 2020-03-29 LAB — SARS CORONAVIRUS 2 (TAT 6-24 HRS): SARS Coronavirus 2: NEGATIVE

## 2020-03-31 ENCOUNTER — Telehealth: Payer: Self-pay

## 2020-03-31 MED ORDER — AZITHROMYCIN 250 MG PO TABS: ORAL_TABLET | ORAL | 0 refills | Status: DC

## 2020-03-31 NOTE — Telephone Encounter (Signed)
Sent z-pak to pharmacy

## 2020-03-31 NOTE — Telephone Encounter (Signed)
Pt called and advised she has had body aches, stuffiness, sneezing, headaches, cough, and chest congestion since Friday and tested negative for covid.  Per Lovena Le I sent a z-pak to her pharmacy

## 2020-04-01 ENCOUNTER — Encounter (HOSPITAL_COMMUNITY)
Admission: RE | Disposition: A | Discharge status: Home / Self Care | Payer: Self-pay | Source: Home / Self Care | Attending: Internal Medicine

## 2020-04-01 ENCOUNTER — Ambulatory Visit (HOSPITAL_COMMUNITY)
Admission: RE | Admit: 2020-04-01 | Discharge: 2020-04-01 | Disposition: A | Discharge status: Home / Self Care | Payer: 59 | Attending: Internal Medicine | Admitting: Internal Medicine

## 2020-04-01 ENCOUNTER — Encounter (HOSPITAL_COMMUNITY): Payer: Self-pay | Admitting: Internal Medicine

## 2020-04-01 ENCOUNTER — Ambulatory Visit (HOSPITAL_COMMUNITY): Payer: 59 | Admitting: Certified Registered Nurse Anesthetist

## 2020-04-01 ENCOUNTER — Ambulatory Visit (HOSPITAL_BASED_OUTPATIENT_CLINIC_OR_DEPARTMENT_OTHER): Payer: 59

## 2020-04-01 ENCOUNTER — Other Ambulatory Visit: Payer: Self-pay

## 2020-04-01 ENCOUNTER — Ambulatory Visit
Admission: RE | Admit: 2020-04-01 | Discharge: 2020-04-01 | Disposition: A | Discharge status: Home / Self Care | Payer: 59 | Source: Ambulatory Visit | Attending: Hospice and Palliative Medicine | Admitting: Hospice and Palliative Medicine

## 2020-04-01 DIAGNOSIS — I480 Paroxysmal atrial fibrillation: Secondary | ICD-10-CM | POA: Diagnosis not present

## 2020-04-01 DIAGNOSIS — I361 Nonrheumatic tricuspid (valve) insufficiency: Secondary | ICD-10-CM

## 2020-04-01 DIAGNOSIS — R319 Hematuria, unspecified: Secondary | ICD-10-CM | POA: Diagnosis not present

## 2020-04-01 DIAGNOSIS — Z6841 Body Mass Index (BMI) 40.0 and over, adult: Secondary | ICD-10-CM | POA: Diagnosis not present

## 2020-04-01 DIAGNOSIS — I342 Nonrheumatic mitral (valve) stenosis: Secondary | ICD-10-CM

## 2020-04-01 DIAGNOSIS — I052 Rheumatic mitral stenosis with insufficiency: Secondary | ICD-10-CM | POA: Insufficient documentation

## 2020-04-01 DIAGNOSIS — Z79899 Other long term (current) drug therapy: Secondary | ICD-10-CM | POA: Diagnosis not present

## 2020-04-01 DIAGNOSIS — Z7901 Long term (current) use of anticoagulants: Secondary | ICD-10-CM | POA: Insufficient documentation

## 2020-04-01 DIAGNOSIS — I272 Pulmonary hypertension, unspecified: Secondary | ICD-10-CM | POA: Diagnosis not present

## 2020-04-01 HISTORY — DX: Cardiac arrhythmia, unspecified: I49.9

## 2020-04-01 HISTORY — PX: BUBBLE STUDY: SHX6837

## 2020-04-01 HISTORY — PX: TEE WITHOUT CARDIOVERSION: SHX5443

## 2020-04-01 SURGERY — ECHOCARDIOGRAM, TRANSESOPHAGEAL
Anesthesia: Monitor Anesthesia Care

## 2020-04-01 MED ORDER — BUTAMBEN-TETRACAINE-BENZOCAINE 2-2-14 % EX AERO
INHALATION_SPRAY | CUTANEOUS | Status: DC | PRN
  Administered 2020-04-01: 2 via TOPICAL

## 2020-04-01 MED ORDER — PROPOFOL 10 MG/ML IV BOLUS
INTRAVENOUS | Status: DC | PRN
Start: 2020-04-01 — End: 2020-04-01
  Administered 2020-04-01: 30 mg via INTRAVENOUS
  Administered 2020-04-01: 20 mg via INTRAVENOUS

## 2020-04-01 MED ORDER — PROPOFOL 500 MG/50ML IV EMUL
INTRAVENOUS | Status: DC | PRN
  Administered 2020-04-01: 125 ug/kg/min via INTRAVENOUS

## 2020-04-01 MED ORDER — SODIUM CHLORIDE 0.9 % IV SOLN: INTRAVENOUS | Status: DC | PRN

## 2020-04-01 MED ORDER — LIDOCAINE 2% (20 MG/ML) 5 ML SYRINGE
INTRAMUSCULAR | Status: DC | PRN
  Administered 2020-04-01: 100 mg via INTRAVENOUS

## 2020-04-01 MED ORDER — DEXMEDETOMIDINE (PRECEDEX) IN NS 20 MCG/5ML (4 MCG/ML) IV SYRINGE
PREFILLED_SYRINGE | INTRAVENOUS | Status: DC | PRN
  Administered 2020-04-01 (×2): 4 ug via INTRAVENOUS

## 2020-04-01 NOTE — Anesthesia Preprocedure Evaluation (Addendum)
Anesthesia Evaluation  Patient identified by MRN, date of birth, ID band Patient awake    Reviewed: Allergy & Precautions, NPO status , Patient's Chart, lab work & pertinent test results, reviewed documented beta blocker date and time   Airway Mallampati: II  TM Distance: >3 FB Neck ROM: Full    Dental  (+) Poor Dentition, Chipped, Dental Advisory Given, Missing   Pulmonary shortness of breath, pneumonia, COPD, former smoker,    breath sounds clear to auscultation       Cardiovascular hypertension, Pt. on medications and Pt. on home beta blockers + angina + CAD and +CHF  + dysrhythmias (on warfarin) Atrial Fibrillation + Valvular Problems/Murmurs (mitral stenosis)  Rhythm:Regular + Systolic murmurs Echo 02/5425  1. Left ventricular ejection fraction, by estimation, is 65 to 70%. The left ventricle has normal function. The left ventricle has no regional wall motion abnormalities. Left ventricular diastolic parameters are consistent with Grade II diastolic dysfunction (pseudonormalization).  2. Right ventricular systolic function is normal. The right ventricular size is normal. There is moderately elevated pulmonary artery systolic pressure.  3. Left atrial size was moderately dilated.  4. Mitral valve mean gradient is 3mHg at heart rate 66bpm. The mitral valve is degenerative. Mild mitral valve regurgitation. Severe mitral stenosis.  5. The aortic valve is normal in structure. Aortic valve regurgitation is not visualized.  6. The inferior vena cava is normal in size with <50% respiratory variability, suggesting right atrial pressure of 8 mmHg.    Neuro/Psych PSYCHIATRIC DISORDERS Anxiety Depression    GI/Hepatic GERD  Controlled and Medicated,  Endo/Other  Morbid obesity  Renal/GU ARF and Renal InsufficiencyRenal disease (kidney stones)  negative genitourinary   Musculoskeletal  (+) Arthritis ,   Abdominal Normal  abdominal exam  (+) + obese,   Peds negative pediatric ROS (+)  Hematology  (+) anemia ,   Anesthesia Other Findings H/o breast cancer  Reproductive/Obstetrics negative OB ROS                             Anesthesia Physical  Anesthesia Plan  ASA: III  Anesthesia Plan: MAC   Post-op Pain Management:    Induction: Intravenous  PONV Risk Score and Plan: 2 and Midazolam, Ondansetron, Propofol infusion, Treatment may vary due to age or medical condition and TIVA  Airway Management Planned: Nasal ETT  Additional Equipment: None  Intra-op Plan:   Post-operative Plan:   Informed Consent: I have reviewed the patients History and Physical, chart, labs and discussed the procedure including the risks, benefits and alternatives for the proposed anesthesia with the patient or authorized representative who has indicated his/her understanding and acceptance.     Dental advisory given  Plan Discussed with: CRNA  Anesthesia Plan Comments:        Anesthesia Quick Evaluation

## 2020-04-01 NOTE — Transfer of Care (Signed)
Immediate Anesthesia Transfer of Care Note  Patient: Leslie [name]  Procedure(s) Performed: TRANSESOPHAGEAL ECHOCARDIOGRAM (TEE) (N/A ) BUBBLE STUDY  Patient Location: Endoscopy Unit  Anesthesia Type:MAC  Level of Consciousness: drowsy  Airway & Oxygen Therapy: Patient Spontanous Breathing and Patient connected to nasal cannula oxygen  Post-op Assessment: Report given to RN and Post -op Vital signs reviewed and stable  Post vital signs: Reviewed and stable  Last Vitals:  Vitals Value Taken Time  BP 123/67 04/01/20 1149  Temp    Pulse 80 04/01/20 1149  Resp 20 04/01/20 1149  SpO2 95 % 04/01/20 1149  Vitals shown include unvalidated device data.  Last Pain:  Vitals:   04/01/20 0907  PainSc: 0-No pain         Complications: No complications documented.

## 2020-04-01 NOTE — Discharge Instructions (Signed)

## 2020-04-01 NOTE — Anesthesia Procedure Notes (Signed)
Procedure Name: MAC Date/Time: 04/01/2020 10:51 AM Performed by: Dorthea Cove, CRNA Pre-anesthesia Checklist: Patient identified, Emergency Drugs available, Suction available, Patient being monitored and Timeout performed Patient Re-evaluated:Patient Re-evaluated prior to induction Oxygen Delivery Method: Nasal cannula Preoxygenation: Pre-oxygenation with 100% oxygen Induction Type: IV induction Placement Confirmation: positive ETCO2 and CO2 detector Dental Injury: Teeth and Oropharynx as per pre-operative assessment

## 2020-04-01 NOTE — CV Procedure (Signed)
INDICATIONS: Severe MS, rheumatic  PROCEDURE:   Informed consent was obtained prior to the procedure. The risks, benefits and alternatives for the procedure were discussed and the patient comprehended these risks.  Risks include, but are not limited to, cough, sore throat, vomiting, nausea, somnolence, esophageal and stomach trauma or perforation, bleeding, low blood pressure, aspiration, pneumonia, infection, trauma to the teeth and death.    After a procedural time-out, the oropharynx was anesthetized with 20% benzocaine spray.   During this procedure the patient was administered propofol per anesthesia.  The patient's heart rate, blood pressure, and oxygen saturation were monitored continuously during the procedure. The period of conscious sedation was 50 minutes, of which I was present face-to-face 100% of this time.  The transesophageal probe was inserted in the esophagus and stomach without difficulty and multiple views were obtained.  The patient was kept under observation until the patient left the procedure room.  The patient left the procedure room in stable condition.   Agitated microbubble saline contrast was administered.  COMPLICATIONS:    There were no immediate complications.  FINDINGS:   FORMAL ECHOCARDIOGRAM REPORT PENDING Severe rheumatic mitral valve stenosis. Mild MR. Normal biventricular function.  Mild TR.  No PFO  RECOMMENDATIONS:    No new.   Time Spent Directly with the Patient:  60 minutes   Leslie [name] 04/01/2020, 12:32 PM

## 2020-04-01 NOTE — Interval H&P Note (Signed)
History and Physical Interval Note:  04/01/2020 9:24 AM  Leslie [name]  has presented today for surgery, with the diagnosis of SEVERE MITRAL VALVE STENOSIS.  The various methods of treatment have been discussed with the patient and family. After consideration of risks, benefits and other options for treatment, the patient has consented to  Procedure(s): TRANSESOPHAGEAL ECHOCARDIOGRAM (TEE) (N/A) as a surgical intervention.  The patient's history has been reviewed, patient examined, no change in status, stable for surgery.  I have reviewed the patient's chart and labs.  Questions were answered to the patient's satisfaction.     [name]

## 2020-04-02 ENCOUNTER — Encounter (HOSPITAL_COMMUNITY): Payer: Self-pay | Admitting: Internal Medicine

## 2020-04-02 NOTE — Anesthesia Postprocedure Evaluation (Signed)
Anesthesia Post Note  Patient: Leslie [name]  Procedure(s) Performed: TRANSESOPHAGEAL ECHOCARDIOGRAM (TEE) (N/A ) BUBBLE STUDY     Patient location during evaluation: PACU Anesthesia Type: MAC Level of consciousness: awake and alert Pain management: pain level controlled Vital Signs Assessment: post-procedure vital signs reviewed and stable Respiratory status: spontaneous breathing Cardiovascular status: stable Anesthetic complications: no   No complications documented.  Last Vitals:  Vitals:   04/01/20 1200 04/01/20 1205  BP: 138/71 134/62  Pulse: 79 75  Resp: 11 11  Temp:    SpO2: 98% 100%    Last Pain:  Vitals:   04/01/20 1205  TempSrc:   PainSc: 0-No pain                 Nolon Nations

## 2020-04-03 ENCOUNTER — Telehealth: Payer: Self-pay

## 2020-04-03 ENCOUNTER — Other Ambulatory Visit: Payer: Self-pay | Admitting: Hospice and Palliative Medicine

## 2020-04-03 LAB — ECHO TEE: Area-P 1/2: 1.34 cm2

## 2020-04-03 MED ORDER — HYDROCOD POLST-CPM POLST ER 10-8 MG/5ML PO SUER: 5.0000 mL | Freq: Every evening | ORAL | 0 refills | Status: DC | PRN

## 2020-04-03 NOTE — Telephone Encounter (Signed)
Pt called that still coughing none of med help her she try all OTC cough pt want only  tussionex she said that only med help before pres by heather taylor send pres to phar and advised her if she not feeling better need appt

## 2020-04-03 NOTE — Telephone Encounter (Signed)
Pt advised for U/s result showed renal stone non obstructing will pass

## 2020-04-09 ENCOUNTER — Other Ambulatory Visit: Payer: Self-pay

## 2020-04-09 ENCOUNTER — Ambulatory Visit (INDEPENDENT_AMBULATORY_CARE_PROVIDER_SITE_OTHER): Payer: 59

## 2020-04-09 DIAGNOSIS — I050 Rheumatic mitral stenosis: Secondary | ICD-10-CM | POA: Diagnosis not present

## 2020-04-09 DIAGNOSIS — Z5181 Encounter for therapeutic drug level monitoring: Secondary | ICD-10-CM | POA: Diagnosis not present

## 2020-04-09 DIAGNOSIS — I4891 Unspecified atrial fibrillation: Secondary | ICD-10-CM | POA: Diagnosis not present

## 2020-04-09 LAB — POCT INR: INR: 2.6 (ref 2.0–3.0)

## 2020-04-09 NOTE — Patient Instructions (Signed)
-   continue warfarin dosage of 1.25 mg (half the 2.5 mg tablet) every day.   - recheck INR in 5 weeks  WHEN YOU GET THE DATE FOR YOUR PROCEDURE, HOLD YOUR WARFARIN FOR 5 DAYS BEFORE THE SURGERY. IF CLEARED BY THE MD, RESUME WARFARIN THAT NIGHT, WITH EXTRA 1/2 TABLET FOR 2 DAYS CALL ME IF YOU WOULD LIKE TO HAVE YOUR INR CHECKED SOONER

## 2020-04-10 ENCOUNTER — Other Ambulatory Visit: Payer: Self-pay | Admitting: Hospice and Palliative Medicine

## 2020-04-10 DIAGNOSIS — F419 Anxiety disorder, unspecified: Secondary | ICD-10-CM

## 2020-04-10 NOTE — Telephone Encounter (Signed)
Please review and send to pharmacy LOV:03/19/20 NOV:04/17/20

## 2020-04-14 ENCOUNTER — Encounter: Payer: Self-pay | Admitting: *Deleted

## 2020-04-14 ENCOUNTER — Ambulatory Visit (INDEPENDENT_AMBULATORY_CARE_PROVIDER_SITE_OTHER): Payer: 59 | Admitting: Thoracic Surgery (Cardiothoracic Vascular Surgery)

## 2020-04-14 ENCOUNTER — Other Ambulatory Visit: Payer: Self-pay | Admitting: Orthopedic Surgery

## 2020-04-14 ENCOUNTER — Encounter: Payer: Self-pay | Admitting: Thoracic Surgery (Cardiothoracic Vascular Surgery)

## 2020-04-14 ENCOUNTER — Other Ambulatory Visit: Payer: Self-pay

## 2020-04-14 ENCOUNTER — Other Ambulatory Visit: Payer: Self-pay | Admitting: *Deleted

## 2020-04-14 VITALS — BP 142/62 | HR 60 | Resp 20 | Ht 64.0 in | Wt 243.0 lb

## 2020-04-14 DIAGNOSIS — I050 Rheumatic mitral stenosis: Secondary | ICD-10-CM

## 2020-04-14 DIAGNOSIS — G8929 Other chronic pain: Secondary | ICD-10-CM

## 2020-04-14 DIAGNOSIS — I480 Paroxysmal atrial fibrillation: Secondary | ICD-10-CM

## 2020-04-14 DIAGNOSIS — M5441 Lumbago with sciatica, right side: Secondary | ICD-10-CM

## 2020-04-14 MED ORDER — ENOXAPARIN SODIUM 150 MG/ML ~~LOC~~ SOLN: 1.0000 mg/kg | SUBCUTANEOUS | Status: DC

## 2020-04-14 MED ORDER — ENOXAPARIN SODIUM 120 MG/0.8ML ~~LOC~~ SOLN: 1.0000 mg/kg | SUBCUTANEOUS | 0 refills | Status: DC

## 2020-04-14 MED ORDER — ENOXAPARIN (LOVENOX) PATIENT EDUCATION KIT: 1.0000 | PACK | Freq: Once | 0 refills | Status: AC

## 2020-04-14 MED ORDER — ENOXAPARIN (LOVENOX) PATIENT EDUCATION KIT: PACK | Freq: Once | Status: DC

## 2020-04-14 NOTE — Progress Notes (Signed)
[address]       [place] [address]             [phone]     CARDIOTHORACIC SURGERY OFFICE NOTE  Referring Provider is Minna Merritts, MD PCP is Lavera Guise, MD   HPI:  Patient is 55 year old morbidly obese African-American female with history of mitral stenosis, chronic diastolic congestive heart failure, recurrent paroxysmal atrial fibrillation on long-term warfarin anticoagulation, hypertension, hyperlipidemia, GE reflux disease,stage III chronic kidney disease, kidney stones,and possible interstitial lung disease who returns to the office today to further discuss treatment options for management of severe symptomatic mitral stenosis.  She was originally seen in consultation on September 10, 2019 but she was last seen here on March 10, 2020 at which time she complained of worsening symptoms of congestive heart failure.  She subsequently underwent transesophageal echocardiogram on April 01, 2020 which confirmed the presence of rheumatic heart disease with severe mitral stenosis.  She returns to our office today to discuss treatment options further.  She reports stable symptoms of exertional shortness of breath and fatigue which occur on a daily basis with low-level activity.  She is not having resting shortness of breath.  However, she cannot lay flat in bed.  She denies lower extremity edema.  She reports frequent palpitations and episodes of irregular heart rhythm although she states that she feels as though she is in rhythm more often than she is not in rhythm.  She gets more short of breath when her heart is beating rapidly.  She is not having chest pain or chest tightness.  Appetite is normal.  She denies fevers, chills, or productive cough.   Current Outpatient Medications  Medication Sig Dispense Refill  . acetaminophen (TYLENOL) 500 MG tablet Take 1,000 mg by mouth every 6 (six) hours as needed for moderate pain or fever.    Marland Kitchen albuterol (VENTOLIN  HFA) 108 (90 Base) MCG/ACT inhaler Inhale 2 puffs into the lungs every 6 (six) hours as needed for wheezing or shortness of breath. 8 g 2  . ALPRAZolam (XANAX) 0.5 MG tablet TAKE 1 TABLET(0.5 MG) BY MOUTH AT BEDTIME AS NEEDED FOR ANXIETY 30 tablet 0  . amoxicillin (AMOXIL) 500 MG tablet Take 2,000 mg by mouth See admin instructions. Take 4 capsules (2000 mg) by mouth 1 hour prior to dental work    . atorvastatin (LIPITOR) 80 MG tablet TAKE 1 TABLET BY MOUTH EVERY DAY AT 6 PM (Patient taking differently: Take 80 mg by mouth every evening.) 90 tablet 1  . calcium carbonate (TUMS - DOSED IN MG ELEMENTAL CALCIUM) 500 MG chewable tablet Chew 500 mg by mouth daily as needed for indigestion or heartburn.    . chlorpheniramine-HYDROcodone (TUSSIONEX PENNKINETIC ER) 10-8 MG/5ML SUER Take 5 mLs by mouth at bedtime as needed for cough. 140 mL 0  . diltiazem (CARDIZEM CD) 120 MG 24 hr capsule TAKE 1 CAPSULE(120 MG) BY MOUTH DAILY (Patient taking differently: Take 120 mg by mouth daily.) 90 capsule 1  . fluticasone (FLONASE) 50 MCG/ACT nasal spray Place 1 spray into both nostrils daily as needed for rhinitis.    . metoprolol succinate (TOPROL-XL) 50 MG 24 hr tablet TAKE 1 TABLET BY MOUTH EVERY MORNING AND 2 TABLETS BY MOUTH EVERY NIGHT AT BEDTIME (Patient taking differently: Take 50-100 mg by mouth See admin instructions. Take 1 tablet (50 mg) by mouth in the morning and take 2 tablets (100 mg) at bedtime) 270 tablet 1  .  omeprazole (PRILOSEC) 40 MG capsule Take 1 capsule (40 mg total) by mouth daily. 90 capsule 1  . potassium chloride (KLOR-CON) 10 MEQ tablet Take 30 mg in the morning and 20 mg at bedtime (Patient taking differently: Take 20-30 mEq by mouth See admin instructions. Take 3 tablets (30 meq) by mouth in the morning & take 2 tablets (20 meq) by mouth in the evening) 450 tablet 0  . torsemide (DEMADEX) 20 MG tablet TAKE 2 TABLETS(40 MG) BY MOUTH TWICE DAILY (Patient taking differently: Take 40 mg by mouth  in the morning and at bedtime.) 360 tablet 3  . warfarin (COUMADIN) 2.5 MG tablet TAKE AS DIRECTED BY ANTI-COAG CLINIC (Patient taking differently: Take 1.25 mg by mouth daily at 4 PM.) 90 tablet 0   No current facility-administered medications for this visit.      Physical Exam:   Ht 5\' 4"  (1.626 m)   BMI 41.88 kg/m   General:  Obese but well-appearing  Chest:   Clear to auscultation with symmetrical breath sounds  CV:   Regular rate and rhythm  Incisions:  n/a  Abdomen:  Soft nontender  Extremities:  Warm and well-perfused, no edema  Diagnostic Tests:  TRANSESOPHOGEAL ECHO REPORT       Patient Name:  Leslie [name] Date of Exam: 04/01/2020  Medical Rec #: [id number]    Height:    64.0 in  Accession #:  [id number]   Weight:    244.0 lb  Date of Birth: 1965/10/26    BSA:     2.129 m  Patient Age:  5 years    BP:      96/54 mmHg  Patient Gender: F        HR:      48 bpm.  Exam Location: Inpatient   Procedure: Transesophageal Echo, Color Doppler, Cardiac Doppler, 3D Echo  and       Saline Contrast Bubble Study   Indications:   Mitral Stenosis    History:     Patient has prior history of Echocardiogram examinations,  most          recent 06/27/2019. Risk Factors:Hypertension and  Dyslipidemia.    Sonographer:   Mikki Santee RDCS (AE)  Referring Phys: [id number] [name]  Diagnosing Phys: Cherlynn Kaiser MD   PROCEDURE: After discussion of the risks and benefits of a TEE, an  informed consent was obtained from the patient. TEE procedure time was 46  minutes. The transesophogeal probe was passed without difficulty through  the esophogus of the patient. Imaged  were obtained with the patient in a left lateral decubitus position. Local  oropharyngeal anesthetic was provided with Cetacaine. Sedation performed  by different physician. The patient was monitored while under deep   sedation. Anesthestetic sedation was  provided intravenously by Anesthesiology: 733.57mg  of Propofol, 100mg  of  Lidocaine. Image quality was good. The patient's vital signs; including  heart rate, blood pressure, and oxygen saturation; remained stable  throughout the procedure. The patient  developed no complications during the procedure.   IMPRESSIONS    1. Severe mitral valve stenosis. MVA by PISA 1 cm2. MVA by PHT 1.34 cm2,  average MVA by planimetry 1.65 cm2, MVA by continuity equation 1.16 cm2.  The mitral valve is rheumatic. Mild mitral valve regurgitation. Severe  mitral stenosis. The mean mitral  valve gradient is 12.5 mmHg with average heart rate of 83 bpm. Wilkins  score is approximately 5-6. There is normal mobility of leaflet mid and  base, normal leaflet thickness, minimal leaflet calcifications.  Subvalvular apparatus appears mildly thickened  (not optimally visualized).  2. Left ventricular ejection fraction, by estimation, is 60 to 65%. The  left ventricle has normal function.  3. Right ventricular systolic function is normal. The right ventricular  size is mildly enlarged.  4. No mural thrombus in LA. Left atrial size was severely dilated. No  left atrial/left atrial appendage thrombus was detected.  5. Right atrial size was mild to moderately dilated.  6. Tricuspid valve regurgitation is mild to moderate.  7. The aortic valve is tricuspid. Aortic valve regurgitation is not  visualized. No aortic stenosis is present.  8. There is mild (Grade II) atheroma plaque involving the transverse and  descending aorta.  9. Agitated saline contrast bubble study was negative, with no evidence  of any interatrial shunt.   FINDINGS  Left Ventricle: Left ventricular ejection fraction, by estimation, is 60  to 65%. The left ventricle has normal function. The left ventricular  internal cavity size was normal in size.   Right Ventricle: The right ventricular size is  mildly enlarged. No  increase in right ventricular wall thickness. Right ventricular systolic  function is normal.   Left Atrium: No mural thrombus in LA. Left atrial size was severely  dilated. No left atrial/left atrial appendage thrombus was detected.   Right Atrium: Right atrial size was mild to moderately dilated.   Pericardium: There is no evidence of pericardial effusion.   Mitral Valve: Severe mitral valve stenosis. MVA by PISA 1 cm2. MVA by PHT  1.34 cm2, average MVA by planimetry 1.65 cm2, MVA by continuity equation  1.16 cm2. The mitral valve is rheumatic. Mild mitral valve regurgitation.  Severe mitral valve stenosis. MV  peak gradient, 22.0 mmHg. The mean mitral valve gradient is 12.5 mmHg with  average heart rate of 83 bpm.   Tricuspid Valve: The tricuspid valve is normal in structure. Tricuspid  valve regurgitation is mild to moderate.   Aortic Valve: The aortic valve is tricuspid. Aortic valve regurgitation is  not visualized. No aortic stenosis is present.   Pulmonic Valve: The pulmonic valve was normal in structure. Pulmonic valve  regurgitation is trivial.   Aorta: The aortic root and ascending aorta are structurally normal, with  no evidence of dilitation. There is mild (Grade II) atheroma plaque  involving the transverse and descending aorta.   IAS/Shunts: No atrial level shunt detected by color flow Doppler. Agitated  saline contrast was given intravenously to evaluate for intracardiac  shunting. Agitated saline contrast bubble study was negative, with no  evidence of any interatrial shunt.     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 She describes a long history of progressive symptoms of exertional shortness of breath, fatigue, orthopnea, and lower extremity edema consistent with chronic diastolic congestive heart failure, recently New York Heart Association functional class III. The patient also has history of paroxysmal atrial fibrillation with recently increasing episodes of tachypalpitations associated with resting shortness of breath and orthopnea.  I have  personally reviewed the patient's recent transesophageal echocardiogram and again reviewed the patient's most recent transthoracic echocardiogram and diagnostic cardiac catheterization. Echocardiograms reveals normal left ventricular size and systolic function with severe left atrial enlargement and severe mitral stenosis. mitral valve area by PISA was measured 1.0 cm.  By pressure half-time mitral valve area was 1.34 cm.  Using the continuity equation it was 1.16 cm.  There was mild mitral regurgitation and mean transvalvular gradient across mitral valve was 12.5 mmHg with average heart rate of 83 bpm.  The Wilkins score was 5-6.  Diagnostic cardiac catheterization was notable for the absence of significant coronary artery disease and confirmed the presence of moderate pulmonary hypertension.    Plan:  The patientand her husband wereagain counseled at length regarding the options for treatment of severe symptomatic mitral stenosis and recurrent paroxysmal atrial fibrillation.The natural history of rheumatic mitral valve disease was discussed.  Long-term prognosis with continued medical therapy without some type of surgical intervention were discussed.   The role of balloon mitral valvuloplasty was discussed including the functional anatomy of the patient's valve findings on recent TEE.  Surgical options were discussed including elective mitral valve replacement with or without concomitant Maze procedure.   We discussed the fact that with definitive surgery the patient will need valve replacement rather than valve repair.  Under the circumstances we discussed the possibility of replacing the mitral valve using a mechanical prosthesis with the attendant need for long-term anticoagulation using warfarin versus the alternative of replacing it using a bioprosthetic tissue valve with its potential for late structural valve deterioration and failure, depending upon the patient's longevity.  The patient specifically requests that if the mitral valve must be replaced that it be done using a mechanical valve.  The relative risks and benefits of performing a maze procedure at the time of their surgery was discussed at length, including the expected likelihood of long term freedom from recurrent symptomatic atrial fibrillation and/or atrial flutter.  Because of the patient's morbid obesity and baseline pulmonary hypertension, I would be very reluctant to consider minimally invasive approach for surgery in favor conventional median sternotomy.  Expectations for the patient's postoperative convalescence following uncomplicated elective surgery were reviewed. The patient desires to proceed with elective mitral valve replacement using a bileaflet mechanical prosthetic valve and maze procedure in the near future.  We tentatively plan for surgery on April 29, 2020.  The patient has been instructed to stop taking Coumadin 1 week prior to surgery.  She has been given a prescription for Lovenox injections to occur for bridging therapy.  The patient and her husband  understand and accept all potential risks of surgery including but not limited to risk of death, stroke or other neurologic complication, myocardial infarction, congestive heart failure, respiratory failure, renal failure, bleeding requiring transfusion and/or reexploration, arrhythmia, heart block or bradycardia requiring permanent pacemaker insertion, infection or other wound complications, pneumonia, pleural and/or pericardial effusion, pulmonary embolus, aortic dissection or other major vascular complication, or other immediate or delayed complications related to  valve repair or replacement including but not limited to recurrent or persistent mitral regurgitation and/or mitral stenosis, LV outflow tract obstruction, aortic insufficiency, paravalvular leak, posterior AV groove disruption, structural valve deterioration and failure, thrombosis, embolization, or endocarditis.   All of their questions have been answered.      I spent in excess of 15 minutes during the conduct of this office consultation and >50% of this time involved direct face-to-face encounter with the patient for counseling and/or coordination of their care.    Valentina Gu. Roxy Manns, MD 04/14/2020 2:03 PM

## 2020-04-14 NOTE — Patient Instructions (Signed)
Stop taking Coumadin on April 22, 2020 and begin Lovenox injections the following day  Continue taking all other medications without change through the day before surgery.  Make sure to bring all of your medications with you when you come for your Pre-Admission Testing appointment at [facility] Short-Stay Department.  Have nothing to eat or drink after midnight the night before surgery.  On the morning of surgery take only Prilosec and Cardizem CD with a sip of water.  At your appointment for Pre-Admission Testing at the [facility] Short-Stay Department you will be asked to sign permission forms for your upcoming surgery.  By definition your signature on these forms implies that you and/or your designee provide full informed consent for your planned surgical procedure(s), that alternative treatment options have been discussed, that you understand and accept any and all potential risks, and that you have some understanding of what to expect for your post-operative convalescence.  For elective mitral valve repair or replacement potential operative risks include but are not limited to at least some risk of death, stroke or other neurologic complication, myocardial infarction, congestive heart failure, respiratory failure, renal failure, bleeding requiring transfusion and/or reexploration, arrhythmia, heart block or bradycardia requiring permanent pacemaker insertion, infection or other wound complications, pneumonia, pleural and/or pericardial effusion, pulmonary embolus, aortic dissection or other major vascular complication, or other immediate or delayed complications related to valve repair or replacement including but not limited to recurrent or persistent mitral regurgitation and/or mitral stenosis, LV outflow tract obstruction, aortic insufficiency, paravalvular leak, posterior AV groove disruption, late structural valve deterioration and failure, thrombosis,  embolization, or endocarditis.  Specific risks potentially related to the minimally-invasive approach include but are not limited to risk of conversion to full or partial sternotomy, aortic dissection or other major vascular complication, unilateral acute lung injury or pulmonary edema, phrenic nerve dysfunction or paralysis, rib fracture, chronic pain, lung hernia, or lymphocele.  Please call to schedule a follow-up appointment in our office prior to surgery if you have any unresolved questions about your planned surgical procedure, the associated risks, alternative treatment options, and/or expectations for your post-operative recovery.

## 2020-04-17 ENCOUNTER — Ambulatory Visit: Payer: 59 | Admitting: Hospice and Palliative Medicine

## 2020-04-18 ENCOUNTER — Telehealth: Payer: Self-pay

## 2020-04-18 NOTE — Telephone Encounter (Signed)
Attending provider statement for [facility] [employer] was completed and faxed to [phone]. Beginning leave 04/25/20 through 07/28/20. Surgery and admission date is scheduled for 04/29/20

## 2020-04-23 ENCOUNTER — Ambulatory Visit: Payer: Self-pay | Admitting: Internal Medicine

## 2020-04-25 ENCOUNTER — Ambulatory Visit (HOSPITAL_COMMUNITY)
Admission: RE | Admit: 2020-04-25 | Discharge: 2020-04-25 | Disposition: A | Discharge status: Home / Self Care | Payer: 59 | Source: Ambulatory Visit | Attending: Thoracic Surgery (Cardiothoracic Vascular Surgery) | Admitting: Thoracic Surgery (Cardiothoracic Vascular Surgery)

## 2020-04-25 ENCOUNTER — Other Ambulatory Visit
Admission: RE | Admit: 2020-04-25 | Discharge: 2020-04-25 | Disposition: A | Discharge status: Home / Self Care | Payer: 59 | Source: Ambulatory Visit | Attending: Thoracic Surgery (Cardiothoracic Vascular Surgery) | Admitting: Thoracic Surgery (Cardiothoracic Vascular Surgery)

## 2020-04-25 ENCOUNTER — Encounter (HOSPITAL_COMMUNITY)
Admission: RE | Admit: 2020-04-25 | Discharge: 2020-04-25 | Disposition: A | Discharge status: Home / Self Care | Payer: 59 | Source: Ambulatory Visit | Attending: Thoracic Surgery (Cardiothoracic Vascular Surgery) | Admitting: Thoracic Surgery (Cardiothoracic Vascular Surgery)

## 2020-04-25 ENCOUNTER — Encounter (HOSPITAL_COMMUNITY): Payer: Self-pay

## 2020-04-25 ENCOUNTER — Other Ambulatory Visit: Payer: Self-pay

## 2020-04-25 DIAGNOSIS — Z01818 Encounter for other preprocedural examination: Secondary | ICD-10-CM | POA: Diagnosis present

## 2020-04-25 DIAGNOSIS — I050 Rheumatic mitral stenosis: Secondary | ICD-10-CM | POA: Diagnosis not present

## 2020-04-25 DIAGNOSIS — Z01812 Encounter for preprocedural laboratory examination: Secondary | ICD-10-CM | POA: Insufficient documentation

## 2020-04-25 DIAGNOSIS — Z20822 Contact with and (suspected) exposure to covid-19: Secondary | ICD-10-CM | POA: Diagnosis not present

## 2020-04-25 HISTORY — DX: Pneumonia, unspecified organism: J18.9

## 2020-04-25 HISTORY — DX: Cardiac murmur, unspecified: R01.1

## 2020-04-25 LAB — COMPREHENSIVE METABOLIC PANEL
ALT: 31 U/L (ref 0–44)
AST: 34 U/L (ref 15–41)
Albumin: 3.7 g/dL (ref 3.5–5.0)
Alkaline Phosphatase: 144 U/L — ABNORMAL HIGH (ref 38–126)
Anion gap: 10 (ref 5–15)
BUN: 15 mg/dL (ref 6–20)
CO2: 25 mmol/L (ref 22–32)
Calcium: 8.9 mg/dL (ref 8.9–10.3)
Chloride: 102 mmol/L (ref 98–111)
Creatinine, Ser: 1.23 mg/dL — ABNORMAL HIGH (ref 0.44–1.00)
GFR, Estimated: 53 mL/min — ABNORMAL LOW (ref >60)
Glucose, Bld: 114 mg/dL — ABNORMAL HIGH (ref 70–99)
Potassium: 3.5 mmol/L (ref 3.5–5.1)
Sodium: 137 mmol/L (ref 135–145)
Total Bilirubin: 0.4 mg/dL (ref 0.3–1.2)
Total Protein: 7.8 g/dL (ref 6.5–8.1)

## 2020-04-25 LAB — CBC
HCT: 34.2 % — ABNORMAL LOW (ref 36.0–46.0)
Hemoglobin: 11.2 g/dL — ABNORMAL LOW (ref 12.0–15.0)
MCH: 29.5 pg (ref 26.0–34.0)
MCHC: 32.7 g/dL (ref 30.0–36.0)
MCV: 90 fL (ref 80.0–100.0)
Platelets: 363 10*3/uL (ref 150–400)
RBC: 3.8 MIL/uL — ABNORMAL LOW (ref 3.87–5.11)
RDW: 12.7 % (ref 11.5–15.5)
WBC: 5.6 10*3/uL (ref 4.0–10.5)
nRBC: 0 % (ref 0.0–0.2)

## 2020-04-25 LAB — BLOOD GAS, ARTERIAL
Acid-Base Excess: 4 mmol/L — ABNORMAL HIGH (ref 0.0–2.0)
Bicarbonate: 28.1 mmol/L — ABNORMAL HIGH (ref 20.0–28.0)
Drawn by: 602861
FIO2: 21
O2 Saturation: 96.8 %
Patient temperature: 37
pCO2 arterial: 43.2 mmHg (ref 32.0–48.0)
pH, Arterial: 7.429 (ref 7.350–7.450)
pO2, Arterial: 96.4 mmHg (ref 83.0–108.0)

## 2020-04-25 LAB — URINALYSIS, ROUTINE W REFLEX MICROSCOPIC
Bilirubin Urine: NEGATIVE
Glucose, UA: NEGATIVE mg/dL
Hgb urine dipstick: NEGATIVE
Ketones, ur: NEGATIVE mg/dL
Leukocytes,Ua: NEGATIVE
Nitrite: NEGATIVE
Protein, ur: NEGATIVE mg/dL
Specific Gravity, Urine: 1.009 (ref 1.005–1.030)
pH: 7 (ref 5.0–8.0)

## 2020-04-25 LAB — HEMOGLOBIN A1C
Hgb A1c MFr Bld: 6.5 % — ABNORMAL HIGH (ref 4.8–5.6)
Mean Plasma Glucose: 139.85 mg/dL

## 2020-04-25 LAB — SARS CORONAVIRUS 2 (TAT 6-24 HRS): SARS Coronavirus 2: NEGATIVE

## 2020-04-25 LAB — SURGICAL PCR SCREEN
MRSA, PCR: NEGATIVE
Staphylococcus aureus: NEGATIVE

## 2020-04-25 LAB — APTT: aPTT: 42 seconds — ABNORMAL HIGH (ref 24–36)

## 2020-04-25 NOTE — Progress Notes (Signed)
Pre-CABG workup vascular completed     Please see CV Proc for preliminary results.   Leslie [name], RVT

## 2020-04-25 NOTE — Progress Notes (Addendum)
PCP - Clayborn Bigness, MD Cardiologist - Ida Rogue, MD  PPM/ICD - Denies  Chest x-ray - 04/25/20 EKG - 04/25/20 Stress Test - Per pt, done > 10 nyears ago to rule out CP; Per pt CP was not cardiac related. ECHO - 06/27/19 Cardiac Cath - 09/06/19  Sleep Study - Denies  Patient denies being diabetic.  Blood thinner instructions: Per Dr. Roxy Manns Stop Coumadin 1 week before sx, last dose 04/22/20; start Lovenox the next day 04/23/20, continue for four days. PT/INR DOS: Pt on Coumadin-Lovenox bridge.  ERAS Protcol - N/A PRE-SURGERY Ensure or G2- N/A  COVID TEST- 04/25/20; Results pending   Anesthesia review: Yes, cardiac hx.  Patient denies shortness of breath, fever, cough and chest pain at PAT appointment   All instructions explained to the patient, with a verbal understanding of the material. Patient agrees to go over the instructions while at home for a better understanding. Patient also instructed to self quarantine after being tested for COVID-19. The opportunity to ask questions was provided.

## 2020-04-25 NOTE — Pre-Procedure Instructions (Addendum)
Surgical Instructions:    Your procedure is scheduled on Tuesday 04/29/20 (07:30 AM- 1:00 PM).  Report to [facility] Main Entrance "A" at 05:30 A.M., then check in with the Admitting office.  Call this number if you have any questions prior to, or have any problems the morning of surgery:  [phone]    Remember:  Do not eat or drink after midnight the night before your surgery.     Take these medicines the morning of surgery with A SIP OF WATER: diltiazem (CARDIZEM CD)  metoprolol succinate (TOPROL-XL) omeprazole (PRILOSEC)  IF NEEDED: acetaminophen (TYLENOL) ALPRAZolam (XANAX) fluticasone (FLONASE) albuterol (VENTOLIN HFA) inhaler (bring with you the day of surgery)   >>Per Dr. Guy Sandifer instructions, STOP warfarin (COUMADIN) 1 week prior to surgery (Your last dose should be on 04/22/20); Take enoxaparin (LOVENOX) injection from 04/23/20 - 04/26/20).<<   As of today, STOP taking any Aspirin (unless otherwise instructed by your surgeon) Aleve, Naproxen, Ibuprofen, Motrin, Advil, Goody's, BC's, all herbal medications, fish oil, and all vitamins.              Special instructions:   [place]- Preparing For Surgery  Before surgery, you can play an important role. Because skin is not sterile, your skin needs to be as free of germs as possible. You can reduce the number of germs on your skin by washing with CHG (chlorahexidine gluconate) Soap before surgery.  CHG is an antiseptic cleaner which kills germs and bonds with the skin to continue killing germs even after washing.    Oral Hygiene is also important to reduce your risk of infection.  Remember - BRUSH YOUR TEETH THE MORNING OF SURGERY WITH YOUR REGULAR TOOTHPASTE  Please do not use if you have an allergy to CHG or antibacterial soaps. If your skin becomes reddened/irritated stop using the CHG.  Do not shave (including legs and underarms) for at least 48 hours prior to first CHG shower. It is OK to shave your  face.  Please follow these instructions carefully.   1. Shower the NIGHT BEFORE SURGERY and the MORNING OF SURGERY  2. If you chose to wash your hair, wash your hair first as usual with your normal shampoo.  3. After you shampoo, rinse your hair and body thoroughly to remove the shampoo.  4. Wash Face and genitals (private parts) with your normal soap.   5. Use CHG Soap as you would any other liquid soap. You can apply CHG directly to the skin and wash gently with a scrungie or a clean washcloth.   6. Apply the CHG Soap to your body ONLY FROM THE NECK DOWN.  Do not use on open wounds or open sores. Avoid contact with your eyes, ears, mouth and genitals (private parts). Wash Face and genitals (private parts)  with your normal soap.   7. Wash thoroughly, paying special attention to the area where your surgery will be performed.  8. Thoroughly rinse your body with warm water from the neck down.  9. DO NOT shower/wash with your normal soap after using and rinsing off the CHG Soap.  10. Pat yourself dry with a CLEAN TOWEL.  11. Wear CLEAN PAJAMAS to bed the night before surgery.  12. Place CLEAN SHEETS on your bed the night before your surgery.  13. DO NOT SLEEP WITH PETS.   Day of Surgery: SHOWER with CHG soap. Brush your teeth WITH YOUR REGULAR TOOTHPASTE. Wear Clean/Comfortable clothing the morning of surgery. Do not apply any deodorants/lotions.  Do not wear jewelry, make up, or nail polish.  Do NOT Smoke (Tobacco/Vaping) or drink Alcohol 24 hours prior to your procedure. Do not shave 48 hours prior to surgery.   Do not bring valuables to the hospital. [facility] is not responsible for any belongings or valuables.  If you use a CPAP at night, you may bring all equipment for your overnight stay.   Contacts, glasses, or dentures may not be worn into surgery, please bring cases for these belongings.   For patients admitted to the hospital, discharge time will be determined  by your treatment team.   Patients discharged the day of surgery will not be allowed to drive home, and someone needs to stay with them for 24 hours.    Please read over the following fact sheets that you were given.

## 2020-04-28 MED ORDER — VANCOMYCIN HCL 1000 MG IV SOLR
INTRAVENOUS | Status: DC
  Filled 2020-04-28: qty 1000

## 2020-04-28 MED ORDER — SODIUM CHLORIDE 0.9 % IV SOLN
INTRAVENOUS | Status: DC
  Filled 2020-04-28: qty 30

## 2020-04-28 MED ORDER — SODIUM CHLORIDE 0.9 % IV SOLN
1.5000 g | INTRAVENOUS | Status: AC
  Administered 2020-04-29: 1.5 g via INTRAVENOUS
  Filled 2020-04-28: qty 1.5

## 2020-04-28 MED ORDER — MILRINONE LACTATE IN DEXTROSE 20-5 MG/100ML-% IV SOLN
0.3000 ug/kg/min | INTRAVENOUS | Status: AC
  Administered 2020-04-29: .3 ug/kg/min via INTRAVENOUS
  Filled 2020-04-28: qty 100

## 2020-04-28 MED ORDER — VANCOMYCIN HCL 1500 MG/300ML IV SOLN
1500.0000 mg | INTRAVENOUS | Status: AC
  Administered 2020-04-29: 1500 mg via INTRAVENOUS
  Filled 2020-04-28: qty 300

## 2020-04-28 MED ORDER — POTASSIUM CHLORIDE 2 MEQ/ML IV SOLN
80.0000 meq | INTRAVENOUS | Status: DC
  Filled 2020-04-28: qty 40

## 2020-04-28 MED ORDER — INSULIN REGULAR(HUMAN) IN NACL 100-0.9 UT/100ML-% IV SOLN
INTRAVENOUS | Status: DC
  Filled 2020-04-28: qty 100

## 2020-04-28 MED ORDER — TRANEXAMIC ACID (OHS) BOLUS VIA INFUSION
15.0000 mg/kg | INTRAVENOUS | Status: AC
  Administered 2020-04-29: 1159.5 mg via INTRAVENOUS
  Filled 2020-04-28: qty 1160

## 2020-04-28 MED ORDER — NOREPINEPHRINE 4 MG/250ML-% IV SOLN
0.0000 ug/min | INTRAVENOUS | Status: DC
  Filled 2020-04-28: qty 250

## 2020-04-28 MED ORDER — MANNITOL 20 % IV SOLN
Freq: Once | INTRAVENOUS | Status: DC
  Filled 2020-04-28: qty 13

## 2020-04-28 MED ORDER — PLASMA-LYTE 148 IV SOLN
INTRAVENOUS | Status: DC
  Filled 2020-04-28: qty 2.5

## 2020-04-28 MED ORDER — NITROGLYCERIN IN D5W 200-5 MCG/ML-% IV SOLN
2.0000 ug/min | INTRAVENOUS | Status: DC
  Filled 2020-04-28: qty 250

## 2020-04-28 MED ORDER — EPINEPHRINE HCL 5 MG/250ML IV SOLN IN NS
0.0000 ug/min | INTRAVENOUS | Status: DC
  Filled 2020-04-28: qty 250

## 2020-04-28 MED ORDER — PHENYLEPHRINE HCL-NACL 20-0.9 MG/250ML-% IV SOLN
30.0000 ug/min | INTRAVENOUS | Status: DC
  Filled 2020-04-28: qty 250

## 2020-04-28 MED ORDER — TRANEXAMIC ACID (OHS) PUMP PRIME SOLUTION
2.0000 mg/kg | INTRAVENOUS | Status: DC
  Filled 2020-04-28: qty 2.23

## 2020-04-28 MED ORDER — TRANEXAMIC ACID 1000 MG/10ML IV SOLN
1.5000 mg/kg/h | INTRAVENOUS | Status: AC
  Administered 2020-04-29: 1.5 mg/kg/h via INTRAVENOUS
  Filled 2020-04-28: qty 25

## 2020-04-28 MED ORDER — DEXMEDETOMIDINE HCL IN NACL 400 MCG/100ML IV SOLN
0.1000 ug/kg/h | INTRAVENOUS | Status: AC
  Administered 2020-04-29: .7 ug/kg/h via INTRAVENOUS
  Administered 2020-04-29: .4 ug/kg/h via INTRAVENOUS
  Filled 2020-04-28: qty 100

## 2020-04-28 MED ORDER — SODIUM CHLORIDE 0.9 % IV SOLN
750.0000 mg | INTRAVENOUS | Status: AC
  Administered 2020-04-29: 750 mg via INTRAVENOUS
  Filled 2020-04-28: qty 750

## 2020-04-28 NOTE — Anesthesia Preprocedure Evaluation (Addendum)
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 on medications and Pt. on home beta blockers +CHF  + dysrhythmias Atrial Fibrillation + Valvular Problems/Murmurs (MS)  Rhythm:Regular Rate:Normal + Diastolic murmurs    Neuro/Psych Anxiety Depression negative neurological ROS     GI/Hepatic Neg liver ROS, GERD  Medicated,  Endo/Other  negative endocrine ROS  Renal/GU      Musculoskeletal  (+) Arthritis , Osteoarthritis,    Abdominal (+)  Abdomen: soft. Bowel sounds: normal.  Peds  Hematology  (+) anemia ,   Anesthesia Other Findings   Reproductive/Obstetrics                            Anesthesia Physical Anesthesia Plan  ASA: IV  Anesthesia Plan: General   Post-op Pain Management:    Induction: Intravenous  PONV Risk Score and Plan: 3 and Ondansetron, Dexamethasone, Midazolam and Treatment may vary due to age or medical condition  Airway Management Planned: Mask and Oral ETT  Additional Equipment: Arterial line, CVP, PA Cath, TEE, 3D TEE and Ultrasound Guidance Line Placement  Intra-op Plan:   Post-operative Plan: Post-operative intubation/ventilation  Informed Consent: I have reviewed the patients History and Physical, chart, labs and discussed the procedure including the risks, benefits and alternatives for the proposed anesthesia with the patient or authorized representative who has indicated his/her understanding and acceptance.     Dental advisory given  Plan Discussed with:  CRNA  Anesthesia Plan Comments: (ECHO 04/01/20: Severe mitral valve stenosis. MVA by PISA 1 cm2. MVA by PHT 1.34 cm2,  average MVA by planimetry 1.65 cm2, MVA by continuity equation 1.16 cm2.  The mitral valve is rheumatic. Mild mitral valve regurgitation. Severe  mitral stenosis. The mean mitral  valve gradient is 12.5 mmHg with average heart rate of 83 bpm. Wilkins  score is approximately 5-6. There is normal mobility of leaflet mid and  base, normal leaflet thickness, minimal leaflet calcifications.  Subvalvular apparatus appears mildly thickened  (not optimally visualized).  2. Left ventricular ejection fraction, by estimation, is 60 to 65%. The  left ventricle has normal function.  3. Right ventricular systolic function is normal. The right ventricular  size is mildly enlarged.  4. No mural thrombus in LA. Left atrial size was severely dilated. No  left atrial/left atrial appendage thrombus was detected.  5. Right atrial size was mild to moderately dilated.  6. Tricuspid valve regurgitation is mild to moderate.  7. The aortic valve is tricuspid. Aortic valve regurgitation is not  visualized. No aortic stenosis is present.  8. There is mild (Grade II) atheroma plaque involving the transverse and  descending aorta.  9. Agitated saline contrast bubble study was negative, with no evidence  of any interatrial shunt. )      Anesthesia Quick Evaluation

## 2020-04-28 NOTE — H&P (Signed)
[address]       [place] [address]             [phone]          CARDIOTHORACIC SURGERY HISTORY AND PHYSICAL EXAM  Referring Provider is Minna Merritts, MD PCP is Lavera Guise, MD  Chief Complaint  Patient presents with  . Mitral Stenosis    Surgical consult, Cardiac Cath  09/06/19, ECHO 06/27/2019    HPI:  Patient is 55 year old morbidly obese African-American female with history of mitral stenosis, chronic diastolic congestive heart failure, persistent atrial fibrillation currently maintaining sinus rhythm on long-term warfarin anticoagulation, hypertension, hyperlipidemia, GE reflux disease, kidney stones, and possible interstitial lung disease who has been referred for surgical consultation to discuss treatment options for management of severe symptomatic mitral stenosis.  Patient reports being told that she had a heart murmur many years ago.  She denies any known history of rheumatic fever or rheumatic heart disease.  She reports a long history of intermittent tachypalpitations and exertional shortness of breath dating back several years.  In August 2019 she was hospitalized with atrial fibrillation with rapid ventricular response and acute exacerbation of likely chronic diastolic congestive heart failure.  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  She was started on warfarin anticoagulation and has been followed intermittently ever since by Dr. Rockey Situ.  She has had episodes of intermittent tachypalpitations associated with worsening shortness of breath that have increased in frequency and severity in recent months.  She has developed progressive symptoms of exertional shortness of breath and fluid retention requiring escalating doses of diuretic therapy.  Recent follow-up  transthoracic echocardiogram performed June 27, 2019 revealed findings consistent with further progression in the severity of mitral stenosis with mean transvalvular gradient estimated 10 mmHg at a heart rate of 66 bpm.  Gross appearance of the mitral valve appeared consistent with rheumatic disease with mitral valve area by planimetry measured only 1.05 cm.  Left ventricular systolic function remained normal with ejection fraction estimated 65 to 70%.  The patient subsequently underwent left and right heart catheterization September 06, 2019.  Catheterization revealed normal coronary artery anatomy with no significant coronary artery disease.  There was moderate pulmonary hypertension with PA pressures measured 52/26 and pulmonary capillary wedge pressure ranging between 26 and 31 mmHg.  Cardiothoracic surgical consultation was requested.  Patient is married and lives locally in [place] with her husband.  She works full-time for [employer] where she has worked for more than 26 years.  She has been obese for the majority of her adult life.  She complains of a gradual progression of symptoms of exertional shortness of breath over many years.  The patient states that over the last 6 months she has gotten to the point where she cannot do much of anything without getting short of breath.  She cannot go up a flight of stairs without having to stop and take a break.  She has had some lower extremity edema requiring increasing doses of oral diuretic.  She recently had a prolonged episode of tachypalpitations associated with resting shortness of breath and severe orthopnea.  Symptoms lasted for several hours prompting her to take extra doses of metoprolol.  Symptoms eventually subsided when her pulse returned to normal.  Intermittent palpitations have increased in frequency in recent months,  but the patient also notes that she gets short of breath even when her heart rate is slow and regular.  Patientreturns to the  office today to furtherdiscuss treatment options for management of severe symptomatic mitral stenosis.She was originally seen in consultation on September 10, 2019 but she was last seen here on March 10, 2020 at which time she complained of worsening symptoms of congestive heart failure.  She subsequently underwent transesophageal echocardiogram on April 01, 2020 which confirmed the presence of rheumatic heart disease with severe mitral stenosis.  She returns to our office today to discuss treatment options further.  She reports stable symptoms of exertional shortness of breath and fatigue which occur on a daily basis with low-level activity.  She is not having resting shortness of breath.  However, she cannot lay flat in bed.  She denies lower extremity edema.  She reports frequent palpitations and episodes of irregular heart rhythm although she states that she feels as though she is in rhythm more often than she is not in rhythm.  She gets more short of breath when her heart is beating rapidly.  She is not having chest pain or chest tightness.  Appetite is normal.  She denies fevers, chills, or productive cough.    Past Medical History:  Diagnosis Date  . (HFpEF) heart failure with preserved ejection fraction ([place])    a. 08/2017 Echo: EF 55-60%.  Grade 2 diastolic dysfunction.  Moderate mitral stenosis.  . Allergy   . Anemia   . Anxiety   . BRCA negative 03/22/2013  . Bronchitis 02/19/2016   ON LEVAQUIN PO  . Chest tightness   . CHF (congestive heart failure) ([place])   . Cigarette smoker 09/11/2017   8-10 day  . Dyspnea   . Dysrhythmia   . GERD (gastroesophageal reflux disease)   . Heart murmur    Per pt. dx by Dr. Roxy Manns via [facility]  . History of kidney stones   . Hyperlipidemia   . Hypertension   . Interstitial lung disease ([place])    a. CT 2013 b. 02/2018 CXR noted recurrent intersistial changes ILD vs chronic bronchitis  . Moderate mitral stenosis    a.  08/2017 TEE: EF 60 to 65%.   Moderate mitral stenosis.  Mean gradient 14 mmHg.  Valve area 2.59 cm by planimetry, 2.72 cm by pressure half-time.  . Persistent atrial fibrillation ([place])    a. 08/2017 s/p TEE/DCCV; b. CHA2DS2VASc = 2-->warfarin.  . Pneumonia     Past Surgical History:  Procedure Laterality Date  . ABDOMINAL HYSTERECTOMY     total  . ABDOMINAL HYSTERECTOMY    . BREAST BIOPSY Bilateral 2012  . BREAST BIOPSY Right 08-07-12   fibroadenomatous changes and columnar cells  . BREAST BIOPSY  02/03/2015   stereo byrnett  . BUBBLE STUDY  04/01/2020   Procedure: BUBBLE STUDY;  Surgeon: [name], MD;  Location: [facility];  Service: Cardiovascular;;  . CARDIAC CATHETERIZATION    . CHOLECYSTECTOMY N/A 02/27/2016   Procedure: LAPAROSCOPIC CHOLECYSTECTOMY;  Surgeon: Christene Lye, MD;  Location: [facility];  Service: General;  Laterality: N/A;  . COLONOSCOPY WITH PROPOFOL N/A 09/26/2018   Procedure: COLONOSCOPY WITH PROPOFOL;  Surgeon: Lucilla Lame, MD;  Location: [facility];  Service: Endoscopy;  Laterality: N/A;  . CYSTOSCOPY W/ RETROGRADES Right 08/18/2018   Procedure: CYSTOSCOPY WITH RETROGRADE PYELOGRAM;  Surgeon: Billey Co, MD;  Location: [facility];  Service: Urology;  Laterality: Right;  . CYSTOSCOPY/URETEROSCOPY/HOLMIUM LASER/STENT PLACEMENT Right 08/18/2018  Procedure: CYSTOSCOPY/URETEROSCOPY/STENT PLACEMENT;  Surgeon: Billey Co, MD;  Location: [facility];  Service: Urology;  Laterality: Right;  . DIAGNOSTIC LAPAROSCOPY    . ESOPHAGOGASTRODUODENOSCOPY (EGD) WITH PROPOFOL N/A 09/26/2018   Procedure: ESOPHAGOGASTRODUODENOSCOPY (EGD) WITH PROPOFOL;  Surgeon: Lucilla Lame, MD;  Location[facility];  Service: Endoscopy;  Laterality: N/A;  . GIVENS CAPSULE STUDY N/A 11/03/2018   Procedure: GIVENS CAPSULE STUDY;  Surgeon: Lucilla Lame, MD;  Location: [facility] ENDOSCOPY;  Service: Endoscopy;  Laterality: N/A;  . JOINT REPLACEMENT Left    knee  . KNEE ARTHROPLASTY Right 04/09/2019    Procedure: COMPUTER ASSISTED TOTAL KNEE ARTHROPLASTY;  Surgeon: Dereck Leep, MD;  Location: [facility];  Service: Orthopedics;  Laterality: Right;  . KNEE CLOSED REDUCTION Left 04/15/2015   Procedure: CLOSED MANIPULATION KNEE;  Surgeon: Thornton [place], MD;  Location: [facility];  Service: Orthopedics;  Laterality: Left;  . KNEE SURGERY    . MULTIPLE EXTRACTIONS WITH ALVEOLOPLASTY N/A 02/28/2020   Procedure: MULTIPLE EXTRACTION WITH ALVEOLOPLASTY;  Surgeon: [name], DMD;  Location: [facility];  Service: Dentistry;  Laterality: N/A;  . RIGHT/LEFT HEART CATH AND CORONARY ANGIOGRAPHY Bilateral 09/06/2019   Procedure: RIGHT/LEFT HEART CATH AND CORONARY ANGIOGRAPHY;  Surgeon: Minna Merritts, MD;  Location: [facility];  Service: Cardiovascular;  Laterality: Bilateral;  . TEE WITHOUT CARDIOVERSION N/A 09/13/2017   Procedure: TRANSESOPHAGEAL ECHOCARDIOGRAM (TEE);  Surgeon: Nelva Bush, MD;  Location: [facility];  Service: Cardiovascular;  Laterality: N/A;  . TEE WITHOUT CARDIOVERSION N/A 04/01/2020   Procedure: TRANSESOPHAGEAL ECHOCARDIOGRAM (TEE);  Surgeon: [name], MD;  Location: [facility];  Service: Cardiovascular;  Laterality: N/A;  . TOTAL KNEE ARTHROPLASTY Left 12/25/2014   Procedure: TOTAL KNEE ARTHROPLASTY;  Surgeon: [place], MD;  Location: [facility];  Service: Orthopedics;  Laterality: Left;  . TUBAL LIGATION      Family History  Problem Relation Age of Onset  . Cancer Mother 5       breast  . Hypertension Mother   . Cancer Maternal Aunt        breast  . Cancer Maternal Grandmother        breast  . Osteoarthritis Father   . Hypertension Father     Social History Social History   Tobacco Use  . Smoking status: Former Smoker    Packs/day: 0.50    Years: 20.00    Pack years: 10.00    Types: Cigarettes    Quit date: 02/20/2018    Years since quitting: 2.1  . Smokeless tobacco: Never Used  Vaping Use  . Vaping Use: Never used  Substance Use  Topics  . Alcohol use: Yes    Comment: rarely  . Drug use: No    Prior to Admission medications   Medication Sig Start Date End Date Taking? Authorizing Provider  acetaminophen (TYLENOL) 500 MG tablet Take 1,000 mg by mouth every 6 (six) hours as needed for moderate pain.   Yes [provider]  albuterol (VENTOLIN HFA) 108 (90 Base) MCG/ACT inhaler Inhale 2 puffs into the lungs every 6 (six) hours as needed for wheezing or shortness of breath. 01/25/20  Yes Luiz Ochoa, NP  ALPRAZolam (XANAX) 0.5 MG tablet TAKE 1 TABLET(0.5 MG) BY MOUTH AT BEDTIME AS NEEDED FOR ANXIETY Patient taking differently: Take 0.5 mg by mouth daily as needed for anxiety or sleep. 04/11/20  Yes Luiz Ochoa, NP  atorvastatin (LIPITOR) 80 MG tablet TAKE 1 TABLET BY MOUTH EVERY DAY AT 6 PM  Patient taking differently: Take 80 mg by mouth every evening. 12/12/19  Yes Loel Dubonnet, NP  calcium carbonate (TUMS - DOSED IN MG ELEMENTAL CALCIUM) 500 MG chewable tablet Chew 500 mg by mouth daily as needed for indigestion or heartburn.   Yes [provider]  diltiazem (CARDIZEM CD) 120 MG 24 hr capsule TAKE 1 CAPSULE(120 MG) BY MOUTH DAILY Patient taking differently: Take 120 mg by mouth daily. 02/01/20  Yes Luiz Ochoa, NP  fluticasone (FLONASE) 50 MCG/ACT nasal spray Place 1 spray into both nostrils daily as needed for rhinitis.   Yes [provider]  metoprolol succinate (TOPROL-XL) 50 MG 24 hr tablet TAKE 1 TABLET BY MOUTH EVERY MORNING AND 2 TABLETS BY MOUTH EVERY NIGHT AT BEDTIME Patient taking differently: Take 50-100 mg by mouth See admin instructions. Take 1 tablet (50 mg) by mouth in the morning and take 2 tablets (100 mg) at bedtime 12/12/19  Yes Loel Dubonnet, NP  omeprazole (PRILOSEC) 40 MG capsule Take 1 capsule (40 mg total) by mouth daily. 12/06/19  Yes Boscia, Greer Ee, NP  potassium chloride (KLOR-CON) 10 MEQ tablet Take 30 mg in the morning and 20 mg at  bedtime Patient taking differently: Take 20-30 mEq by mouth See admin instructions. Take 3 tablets (30 meq) by mouth in the morning & take 20 meq by mouth in the evening 03/10/20  Yes Loel Dubonnet, NP  torsemide (DEMADEX) 20 MG tablet TAKE 2 TABLETS(40 MG) BY MOUTH TWICE DAILY Patient taking differently: Take 40 mg by mouth in the morning and at bedtime. 03/03/20  Yes Minna Merritts, MD  warfarin (COUMADIN) 2.5 MG tablet TAKE AS DIRECTED BY ANTI-COAG CLINIC Patient taking differently: Take 1.25 mg by mouth daily at 4 PM. 12/19/19  Yes Gollan, Kathlene November, MD  amoxicillin (AMOXIL) 500 MG tablet Take 2,000 mg by mouth See admin instructions. Take 4 capsules (2000 mg) by mouth 1 hour prior to dental work 03/06/20   [provider]  chlorpheniramine-HYDROcodone (TUSSIONEX PENNKINETIC ER) 10-8 MG/5ML SUER Take 5 mLs by mouth at bedtime as needed for cough. Patient not taking: No sig reported 04/03/20   Luiz Ochoa, NP  enoxaparin (LOVENOX) 120 MG/0.8ML injection Inject 0.74 mLs (111 mg total) into the skin daily for 4 days. 04/24/20 04/28/20  Rexene Alberts, MD    Allergies  Allergen Reactions  . Morphine Hives and Rash  . Oxycodone Hcl Hives and Itching  . Dilaudid [Hydromorphone Hcl] Itching     Review of Systems:              General:                      normal appetite, decreased energy, no weight gain, no weight loss, no fever             Cardiac:                       + chest pain with exertion, no chest pain at rest, +SOB with exertion, occasional resting SOB that occur with tachypalpitations, no PND, + orthopnea, + palpitations, + arrhythmia, + atrial fibrillation, + LE edema, no dizzy spells, no syncope             Respiratory:                 + shortness of breath, no home oxygen, no productive cough, + intermittent dry cough, no bronchitis,  no wheezing, no hemoptysis, no asthma, no pain with inspiration or cough, no sleep apnea, no CPAP at night             GI:                                no difficulty swallowing, no reflux, no frequent heartburn, no hiatal hernia, no abdominal pain, no constipation, no diarrhea, no hematochezia, no hematemesis, no melena             GU:                              no dysuria,  no frequency, no urinary tract infection, no hematuria, + kidney stones, + kidney disease             Vascular:                     no pain suggestive of claudication, no pain in feet, no leg cramps, no varicose veins, no DVT, no non-healing foot ulcer             Neuro:                         no stroke, no TIA's, no seizures, no headaches, no temporary blindness one eye,  no slurred speech, no peripheral neuropathy, no chronic pain, no instability of gait, no memory/cognitive dysfunction             Musculoskeletal:         + arthritis, no joint swelling, no myalgias, no difficulty walking, normal mobility              Skin:                            no rash, no itching, no skin infections, no pressure sores or ulcerations             Psych:                         no anxiety, no depression, no nervousness, no unusual recent stress             Eyes:                           no blurry vision, no floaters, no recent vision changes, + wears glasses or contacts             ENT:                            no hearing loss, no loose or painful teeth, no dentures, last saw dentist many years ago             Hematologic:               no easy bruising, no abnormal bleeding, no clotting disorder, no frequent epistaxis             Endocrine:                   no diabetes, does not check CBG's at home  Physical Exam:              BP 115/63 (BP Location: Left Arm, Patient Position: Sitting, Cuff Size: Large)   Pulse 89   Temp 97.9 F (36.6 C) (Temporal)   Resp 20   Ht 5' 4" (1.626 m)   Wt 236 lb (107 kg)   SpO2 99% Comment: RA  BMI 40.51 kg/m              General:                      Morbidly  obese,  well-appearing             HEENT:                       Unremarkable              Neck:                           no JVD, no bruits, no adenopathy              Chest:                          clear to auscultation, symmetrical breath sounds, no wheezes, no rhonchi              CV:                              RRR, no murmur             Abdomen:                    soft, non-tender, no masses              Extremities:                 warm, well-perfused, pulses diminished, + LE edema             Rectal/GU                   Deferred             Neuro:                         Grossly non-focal and symmetrical throughout             Skin:                            Clean and dry, no rashes, no breakdown   Diagnostic Tests:  ECHOCARDIOGRAM REPORT       Patient Name:  Leslie [name] Date of Exam: 06/27/2019  Medical Rec #: [id number]   Height:    64.0 in  Accession #:  [id number]  Weight:    233.0 lb  Date of Birth: [birth year]   BSA:     2.088 m  Patient Age:  34 years   BP:      138/90 mmHg  Patient Gender: F       HR:      64 bpm.  Exam Location: [facility]   Procedure: 2D Echo, Cardiac Doppler, Color Doppler and Intracardiac       Opacification Agent   Indications:  I34.2 Nonrheumatic mitral (valve) stenosis    History:    Patient has prior history of Echocardiogram examinations,  most         recent 08/29/2018. CHF, CAD, Arrythmias:Atrial  Fibrillation; Risk         Factors:Former Smoker, Hypertension and Dyslipidemia.    Sonographer:  Pilar Jarvis RDMS, RVT, RDCS  Referring Phys: [place]    1. Left ventricular ejection fraction, by estimation, is 65 to 70%. The  left ventricle has normal function. The left ventricle has no regional  wall motion abnormalities. Left ventricular diastolic parameters are  consistent with Grade II diastolic  dysfunction  (pseudonormalization).  2. Right ventricular systolic function is normal. The right ventricular  size is normal. There is moderately elevated pulmonary artery systolic  pressure.  3. Left atrial size was moderately dilated.  4. Mitral valve mean gradient is 102mHg at heart rate 66bpm. The mitral  valve is degenerative. Mild mitral valve regurgitation. Severe mitral  stenosis.  5. The aortic valve is normal in structure. Aortic valve regurgitation is  not visualized.  6. The inferior vena cava is normal in size with <50% respiratory  variability, suggesting right atrial pressure of 8 mmHg.   FINDINGS  Left Ventricle: Left ventricular ejection fraction, by estimation, is 65  to 70%. The left ventricle has normal function. The left ventricle has no  regional wall motion abnormalities. Definity contrast agent was given IV  to delineate the left ventricular  endocardial borders. The left ventricular internal cavity size was normal  in size. There is no left ventricular hypertrophy. Left ventricular  diastolic parameters are consistent with Grade II diastolic dysfunction  (pseudonormalization).   Right Ventricle: The right ventricular size is normal. No increase in  right ventricular wall thickness. Right ventricular systolic function is  normal. There is moderately elevated pulmonary artery systolic pressure.  The tricuspid regurgitant velocity is  3.35 m/s, and with an assumed right atrial pressure of 8 mmHg, the  estimated right ventricular systolic pressure is 538.1mmHg.   Left Atrium: Left atrial size was moderately dilated.   Right Atrium: Right atrial size was normal in size.   Pericardium: There is no evidence of pericardial effusion.   Mitral Valve: Mitral valve mean gradient is 161mg at heart rate 66bpm.  The mitral valve is degenerative in appearance. Mild mitral valve  regurgitation. Severe mitral valve stenosis. Mitral valve area by  planimetry is 1.05 cm, and  by the pressure  half-time method is calculated to be 2.51 cm. The mean transmitral  gradient is 9.3 mmHg. MV peak gradient, 25.6 mmHg. The mean mitral valve  gradient is 9.3 mmHg.   Tricuspid Valve: The tricuspid valve is normal in structure. Tricuspid  valve regurgitation is not demonstrated.   Aortic Valve: The aortic valve is normal in structure. Aortic valve  regurgitation is not visualized. Aortic valve mean gradient measures 6.0  mmHg. Aortic valve peak gradient measures 9.5 mmHg. Aortic valve area, by  VTI measures 2.61 cm.   Pulmonic Valve: The pulmonic valve was not well visualized. Pulmonic valve  regurgitation is not visualized.   Aorta: The aortic root is normal in size and structure.   Venous: The inferior vena cava is normal in size with less than 50%  respiratory variability, suggesting right atrial pressure of 8 mmHg.   IAS/Shunts: No atrial level shunt detected by color flow Doppler.     LEFT VENTRICLE  PLAX 2D  LVIDd:  4.10 cm Diastology  LVIDs:     2.30 cm LV e' lateral:  4.30 cm/s  LV PW:     1.20 cm LV E/e' lateral: 50.0  LV IVS:    1.00 cm LV e' medial:  3.57 cm/s  LVOT diam:   2.00 cm LV E/e' medial: 60.2  LV SV:     104  LV SV Index:  50  LVOT Area:   3.14 cm     RIGHT VENTRICLE      IVC  RV Basal diam: 3.40 cm  IVC diam: 1.30 cm  RV S prime:   9.89 cm/s  TAPSE (M-mode): 2.5 cm   LEFT ATRIUM       Index    RIGHT ATRIUM      Index  LA diam:    3.20 cm 1.53 cm/m RA Area:   19.20 cm  LA Vol (A2C):  94.9 ml 45.46 ml/m RA Volume:  60.00 ml 28.74 ml/m  LA Vol (A4C):  77.2 ml 36.98 ml/m  LA Biplane Vol: 90.4 ml 43.30 ml/m  AORTIC VALVE          PULMONIC VALVE  AV Area (Vmax):  2.63 cm   PV Vmax:    1.06 m/s  AV Area (Vmean):  2.53 cm   PV Peak grad: 4.5 mmHg  AV Area (VTI):   2.61 cm  AV Vmax:      154.00 cm/s  AV Vmean:      111.000 cm/s  AV VTI:      0.397 m  AV Peak Grad:   9.5 mmHg  AV Mean Grad:   6.0 mmHg  LVOT Vmax:     129.00 cm/s  LVOT Vmean:    89.500 cm/s  LVOT VTI:     0.330 m  LVOT/AV VTI ratio: 0.83    AORTA  Ao Root diam: 2.90 cm  Ao Asc diam: 2.40 cm  Ao Arch diam: 2.6 cm   MITRAL VALVE        TRICUSPID VALVE  MV Area (PHT): 2.51 cm  TR Peak grad:  44.9 mmHg  MV Area (plan): 1.05 cm  TR Vmax:    335.00 cm/s  MV Peak grad:  25.6 mmHg  MV Mean grad:  9.3 mmHg  SHUNTS  MV Vmax:    2.53 m/s  Systemic VTI: 0.33 m  MV Vmean:    140.7 cm/s Systemic Diam: 2.00 cm  MV Decel Time: 302 msec  MV E velocity: 215.00 cm/s  MV A velocity: 208.00 cm/s  MV E/A ratio: 1.03   Kate Sable MD  Electronically signed by Kate Sable MD  Signature Date/Time: 06/27/2019/4:32:26 PM      RIGHT/LEFT HEART CATH AND CORONARY ANGIOGRAPHY  Conclusion   Hemodynamic findings consistent with moderate pulmonary hypertension.  Indications  Mitral valve stenosis, unspecified etiology [I05.0 (ICD-10-CM)]  Procedural Details  Technical Details Cardiac Catheterization Procedure Note  Name: Leslie [name] MRN: [id number] DOB: 09-24-1965  Procedure: Left Heart Cath, Selective Coronary Angiography, LV angiography, right heart catheterization  Indication:  Komal L Foustis a 55 y.o.femalewith a hx of  mitral valve stenosis morbid obesity,  Snores, possible OSA anxiety,  hyperlipidemia,  Hospital admission 09/11/2017 for shortness of breath chest tightness in atrial fibrillation with RVR Had TEE , Cardioversion Mild COPD, long hx of smoking Cardiac CTA Coronary calcium score of 10. Who presents for cardiac catheterization for work-up of her pulmonary hypertension, severe mitral valve stenosis, arrhythmia, chest tightness/unstable angina     Procedural details:  The right groin was prepped, draped, and anesthetized with  1% lidocaine. Using modified Seldinger technique, a 5 French sheath was introduced into the right femoral artery. Standard Judkins catheters (JL 4, JR 4 and pigtail catheter) were used for coronary angiography and left ventriculography. 7 French catheter laced into right femoral vein for Swan-Ganz catheter and assessment of right heart pressures. Left heart catheter exchanges were performed over a guidewire. There were no immediate procedural complications. The patient was transferred to the post catheterization recovery area for further monitoring.  Difficulty accessing right femoral artery secondary to vessel was deep, located next to the vein, Doppler ultrasound needle used in conjunction with portable Doppler for visualization of the vessel  Moderate sedation: 1. Sedation used: 1 mg versed, 75 ug versed 2. Time of administration: 10:30 am Time patient left for recovery 11:30 am Total sedation time 60 min 3. I was Face to Face with the patient during this time: (code: [phone])   Procedural Findings:  Coronary angiography:  Coronary dominance: Right  Left mainstem: Large vessel that bifurcates into the LAD and left circumflex, no significant disease noted  Left anterior descending (LAD): Large vessel that extends to the apical region, diagonal branch 2 of moderate size, no significant disease noted  Left circumflex (LCx): Large vessel with OM branch 2, no significant disease noted  Right coronary artery (RCA): Right dominant vessel with PL and PDA, no significant disease noted  Left ventriculogram was not performed secondary to renal dysfunction Aortic valve was crossed for pressures, no significant aortic valve stenosis Left ventricular end-diastolic pressure 26 mmHg  RA : 11 RV: 58/7/16 PA: 52/26/39 Wedge 26-31 mmHg, ectopy appreciated LV 145/16/26 Ao 120/55/76 Cardiac output 4.23 Cardiac index 2.02  Final Conclusions:  No significant coronary artery  disease Moderately elevated right heart pressures consistent with moderate pulmonary hypertension Recent echocardiogram with severe mitral valve stenosis  Recommendations:  We will recommend referral to CT surgery for consideration of mitral valve stenosis evaluation, valve replacement Scheduled to have a Zio monitor given continued paroxysmal tachycardia despite high doses of metoprolol She is on high doses of torsemide with worsening renal dysfunction and still with markedly elevated right heart pressures consistent with worsening mitral valve stenosis    Ida Rogue 09/06/2019, 11:18 AM  Estimated blood loss <50 mL.   During this procedure medications were administered to achieve and maintain moderate conscious sedation while the patient's heart rate, blood pressure, and oxygen saturation were continuously monitored and I was present face-to-face 100% of this time.  Medications (Filter: Administrations occurring from [phone] to 1129 on 09/06/19) fentaNYL (SUBLIMAZE) injection (mcg) Total dose:  75 mcg Date/Time  Rate/Dose/Volume Action  09/06/19 1008  25 mcg Given  1037  50 mcg Given    midazolam (VERSED) injection (mg) Total dose:  1 mg Date/Time  Rate/Dose/Volume Action  09/06/19 1008  1 mg Given    lidocaine (PF) (XYLOCAINE) 1 % injection (mL) Total volume:  40 mL Date/Time  Rate/Dose/Volume Action  09/06/19 1013  30 mL Given  1041  10 mL Given    Heparin (Porcine) in NaCl 1000-0.9 UT/500ML-% SOLN (mL) Total volume:  500 mL Date/Time  Rate/Dose/Volume Action  09/06/19 1111  500 mL Given    iohexol (OMNIPAQUE) 300 MG/ML solution (mL) Total volume:  85 mL Date/Time  Rate/Dose/Volume Action  09/06/19 1112  85 mL Given    Sedation Time  Sedation Time Physician-1: 58 minutes 29 seconds  Contrast  Medication Name Total Dose  iohexol (OMNIPAQUE) 300  MG/ML solution 85 mL    Radiation/Fluoro  Fluoro time: 9.5 (min) DAP: 44.5 (Gycm2) Cumulative Air  Kerma: 605 (mGy)  Coronary Findings  Diagnostic Dominance: Right No diagnostic findings have been documented. Intervention  No interventions have been documented. Right Heart  Right Heart Pressures Hemodynamic findings consistent with moderate pulmonary hypertension. Elevated LV EDP consistent with volume overload.  Coronary Diagrams  Diagnostic Dominance: Right  Intervention  Implants      Vascular Products  Device Closure Mynxgrip [phone] Implanted Inventory item: DEVICE CLOSURE MYNXGRIP 89F Model/Cat number: [id number] Manufacturer: [employer] Lot number: [id number] Device identifier: [id number] identifier type: GS1  Area Of Implantation: Groin    GUDID Information  Request status Successful    Brand name: MBristol HospitalVersion/Model: [id number] Company name: [employer] MRI safety info as of 09/06/19: MR Safe  Contains dry or latex rubber: No    GMDN P.T. name: Wound hydrogel dressing, non-antimicrobial    As of 09/06/2019  Status: Implanted      Syngo Images  Show images for CARDIAC CATHETERIZATION Images on Long Term Storage  Show images for FMarlaine, Areyto Procedure Log  Procedure Log    Hemo Data (last day) before discharge   AO Systolic Cath Pressure  AO Diastolic Cath Pressure  AO Mean Cath Pressure  LV Systolic Cath Pressure  LV End Diastolic  LV Systolic  LV End Diastolic  LV dP/dt  PA Systolic Cath Pressure  PA Diastolic Cath Pressure  PA Mean Cath Pressure  RA Wedge A Wave  RA Wedge V Wave  RV Systolic Cath Pressure  RV Diastolic Cath Pressure  RV End Diastolic  RV Systolic  RV End Diastolic  RV dP/dt  PCW A Wave  PCW V Wave  PCW Mean  AO O2 Sat  PA O2 Sat  AO O2 Sat  Fick C.O.  Fick C.I.   --  --  --  --  --  145 mmHg  26 mmHg  1200 mmHg/sec  --  --  --  15 mmHg  12 mmHg  --  --  --  58 mmHg  16 mmHg  432 mmHg/sec  35 mmHg  37 mmHg  31 mmHg  --  --   --  4.23 L/min  2.02 L/min/m2   --  --  --  --  --  --  --  --  55 mmHg  31 mmHg  42 mmHg  --  --  --  --  --  --  --  --  --  --  --  --  --  --  --  --   --  --  --  --  --  --  --  --  52 mmHg  26 mmHg  39 mmHg  --  --  --  --  --  --  --  --  --  --  --  --  --  --  --  --   --  --  --  --  --  --  --  --  --  --  --  --  --  --  --  --  --  --  --  --  --  --  95.3 %  MV  SA  --  --   --  --  --  --  --  --  --  --  --  --  --  --  --  58 mmHg  7 mmHg  16 mmHg  --  --  --  --  --  --  --  --  --  --  --   130  61 mmHg  89 mmHg  --  --  --  --  --  --  --  --  --  --  --  --  --  --  --  --  --  --  --  --  --  --  --  --   --  --  --  132 mmHg  10 mmHg  --  --  --  --  --  --  --  --  --  --  --  --  --  --  --  --  --  --  --  --  --  --   --  --  --  126 mmHg  11 mmHg  --  --  --  --  --  --  --  --  --  --  --  --  --  --  --  --  --  --  --  --  --  --   --  --  --  145 mmHg  26 mmHg  --  --  --  --  --  --  --  --  --  --  --  --  --  --  --  --  --  --  --  --  --  --   120  55 mmHg  76 mmHg  --  --                                                     TRANSESOPHOGEAL ECHO REPORT       Patient Name:  Leslie [name] Date of Exam: 04/01/2020  Medical Rec #: [id number]    Height:    64.0 in  Accession #:  [id number]   Weight:    244.0 lb  Date of Birth: April 07, 1965    BSA:     2.129 m  Patient Age:  18 years    BP:      96/54 mmHg  Patient Gender: F        HR:      48 bpm.  Exam Location: Inpatient   Procedure: Transesophageal Echo, Color Doppler, Cardiac Doppler, 3D Echo  and       Saline Contrast Bubble Study   Indications:   Mitral Stenosis    History:      Patient has prior history of Echocardiogram examinations,  most          recent 06/27/2019. Risk Factors:Hypertension and  Dyslipidemia.    Sonographer:   Mikki Santee RDCS (AE)  Referring Phys: [id number] [name]  Diagnosing Phys: Cherlynn Kaiser MD   PROCEDURE: After discussion of the risks and benefits of a TEE, an  informed consent was obtained from the patient. TEE procedure time was 46  minutes. The transesophogeal probe was passed without difficulty through  the esophogus of the patient. Imaged  were obtained with the patient in a left lateral decubitus position. Local  oropharyngeal anesthetic was provided with Cetacaine. Sedation performed  by different physician. The patient was monitored while under deep  sedation. Anesthestetic sedation was  provided intravenously by Anesthesiology: 733.46m of Propofol, 1018mof  Lidocaine. Image quality was good. The patient's vital signs; including  heart rate, blood pressure, and oxygen saturation; remained stable  throughout the procedure. The patient  developed no complications during the procedure.   IMPRESSIONS    1. Severe mitral valve stenosis. MVA by PISA 1 cm2. MVA by PHT 1.34 cm2,  average MVA by planimetry 1.65 cm2, MVA by continuity equation 1.16 cm2.  The mitral valve is rheumatic. Mild mitral valve regurgitation. Severe  mitral stenosis. The mean mitral  valve gradient is 12.5 mmHg with average heart rate of 83 bpm. Wilkins  score is approximately 5-6. There is normal mobility of leaflet mid and  base, normal leaflet thickness, minimal leaflet calcifications.  Subvalvular apparatus appears mildly thickened  (not optimally visualized).  2. Left ventricular ejection fraction, by estimation, is 60 to 65%. The  left ventricle has normal function.  3. Right ventricular systolic function is normal. The right ventricular  size is mildly enlarged.  4. No mural thrombus in LA. Left atrial  size was severely dilated. No  left atrial/left atrial appendage thrombus was detected.  5. Right atrial size was mild to moderately dilated.  6. Tricuspid valve regurgitation is mild to moderate.  7. The aortic valve is tricuspid. Aortic valve regurgitation is not  visualized. No aortic stenosis is present.  8. There is mild (Grade II) atheroma plaque involving the transverse and  descending aorta.  9. Agitated saline contrast bubble study was negative, with no evidence  of any interatrial shunt.   FINDINGS  Left Ventricle: Left ventricular ejection fraction, by estimation, is 60  to 65%. The left ventricle has normal function. The left ventricular  internal cavity size was normal in size.   Right Ventricle: The right ventricular size is mildly enlarged. No  increase in right ventricular wall thickness. Right ventricular systolic  function is normal.   Left Atrium: No mural thrombus in LA. Left atrial size was severely  dilated. No left atrial/left atrial appendage thrombus was detected.   Right Atrium: Right atrial size was mild to moderately dilated.   Pericardium: There is no evidence of pericardial effusion.   Mitral Valve: Severe mitral valve stenosis. MVA by PISA 1 cm2. MVA by PHT  1.34 cm2, average MVA by planimetry 1.65 cm2, MVA by continuity equation  1.16 cm2. The mitral valve is rheumatic. Mild mitral valve regurgitation.  Severe mitral valve stenosis. MV  peak gradient, 22.0 mmHg. The mean mitral valve gradient is 12.5 mmHg with  average heart rate of 83 bpm.   Tricuspid Valve: The tricuspid valve is normal in structure. Tricuspid  valve regurgitation is mild to moderate.   Aortic Valve: The aortic valve is tricuspid. Aortic valve regurgitation is  not visualized. No aortic stenosis is present.   Pulmonic Valve: The pulmonic valve was normal in structure. Pulmonic valve  regurgitation is trivial.   Aorta: The aortic root and ascending aorta are  structurally normal, with  no evidence of dilitation. There is mild (Grade II) atheroma plaque  involving the transverse and descending aorta.   IAS/Shunts: No atrial level shunt detected by color flow Doppler. Agitated  saline contrast was given intravenously to evaluate for intracardiac  shunting. Agitated saline contrast bubble study was negative, with no  evidence of any interatrial shunt.     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 She describes a long history of progressive symptoms of exertional shortness of breath, fatigue, orthopnea, and lower extremity edema consistent with chronic diastolic congestive heart failure, recently New York Heart Association functional class III. The patient also has history of paroxysmal atrial fibrillation with recently increasing episodes of tachypalpitations associated with resting shortness of breath and orthopnea.  I have personally reviewed the patient's recent transesophageal echocardiogram and againreviewed the patient's most recent transthoracic echocardiogram and diagnostic cardiac catheterization. Echocardiograms reveals normal left ventricular size and systolic function with severe left atrial enlargement and  severe mitral stenosis. mitral valve area by PISA was measured 1.0 cm.  By pressure half-time mitral valve area was 1.34 cm.  Using the continuity equation it was 1.16 cm.  There was mild mitral regurgitation and mean transvalvular gradient across mitral valve was 12.5 mmHg with average heart rate of 83 bpm.  The Wilkins score was 5-6.  Diagnostic cardiac catheterization was notable for the absence of significant coronary artery disease and confirmed the presence of moderate pulmonary hypertension.    Plan:  The patientand her husband wereagaincounseled at length regarding the options for treatment of severe symptomatic mitral stenosis and recurrent paroxysmal atrial fibrillation.The natural history of rheumatic mitral valve disease was discussed. Long-term prognosis with continued medical therapy without some type of surgical intervention were discussed.  The role of balloon mitral valvuloplasty was discussed including the functional anatomy of the patient's valve findings on recent TEE.  Surgical options were discussed including elective mitral valve replacement with or without concomitant Maze procedure.We discussed thefact that withdefinitivesurgery the patient will need valve replacement rather than valve repair. Under the circumstances we discussed thepossibility of replacing the mitral valve using a mechanical prosthesis with the attendant need for long-term anticoagulation using warfarin versus the alternative of replacing it using a bioprosthetic tissue valve with its potential for late structural valve deterioration and failure, depending upon the patient's longevity. The patient specifically requests that if the mitral valve must be replaced that it be done using a mechanicalvalve. The relative risks and benefits of performing a maze procedure at the time of their surgery was discussed at length, including the expected likelihood of long term freedom from recurrent  symptomatic atrial fibrillation and/or atrial flutter.  Because of the patient's morbid obesity and baseline pulmonary hypertension, I would be very reluctant to consider minimally invasive approach for surgery in favor conventional median sternotomy. Expectations for the patient's postoperative convalescence following uncomplicated elective surgery were reviewed. The patient desires to proceed with elective mitral valve replacement using a bileaflet mechanical prosthetic valve and maze procedure in the near future.  We tentatively plan for surgery on April 29, 2020.  The patient has been instructed to stop taking Coumadin 1 week  prior to surgery.  She has been given a prescription for Lovenox injections to occur for bridging therapy.  The patient and her husband understand and accept all potential risks of surgery including but not limited to risk of death, stroke or other neurologic complication, myocardial infarction, congestive heart failure, respiratory failure, renal failure, bleeding requiring transfusion and/or reexploration, arrhythmia, heart block or bradycardia requiring permanent pacemaker insertion, infection or other wound complications, pneumonia, pleural and/or pericardial effusion, pulmonary embolus, aortic dissection or other major vascular complication, or other immediate or delayed complications related to valve repair or replacement including but not limited to recurrent or persistent mitral regurgitation and/or mitral stenosis, LV outflow tract obstruction, aortic insufficiency, paravalvular leak, posterior AV groove disruption, structural valve deterioration and failure, thrombosis, embolization, or endocarditis.   All of their questions have been answered.       Valentina Gu. Roxy Manns, MD 04/14/2020 2:03 PM

## 2020-04-29 ENCOUNTER — Other Ambulatory Visit: Payer: Self-pay

## 2020-04-29 ENCOUNTER — Inpatient Hospital Stay (HOSPITAL_COMMUNITY): Payer: 59 | Admitting: Vascular Surgery

## 2020-04-29 ENCOUNTER — Inpatient Hospital Stay (HOSPITAL_COMMUNITY): Payer: 59

## 2020-04-29 ENCOUNTER — Inpatient Hospital Stay (HOSPITAL_COMMUNITY)
Admission: RE | Admit: 2020-04-29 | Discharge: 2020-05-11 | DRG: 219 | Disposition: A | Discharge status: Home Health | Payer: 59 | Attending: Thoracic Surgery (Cardiothoracic Vascular Surgery) | Admitting: Thoracic Surgery (Cardiothoracic Vascular Surgery)

## 2020-04-29 ENCOUNTER — Inpatient Hospital Stay (HOSPITAL_COMMUNITY): Payer: 59 | Admitting: Anesthesiology

## 2020-04-29 ENCOUNTER — Encounter (HOSPITAL_COMMUNITY): Payer: Self-pay | Admitting: Thoracic Surgery (Cardiothoracic Vascular Surgery)

## 2020-04-29 ENCOUNTER — Encounter (HOSPITAL_COMMUNITY)
Admission: RE | Disposition: A | Discharge status: Home Health | Payer: Self-pay | Source: Home / Self Care | Attending: Thoracic Surgery (Cardiothoracic Vascular Surgery)

## 2020-04-29 ENCOUNTER — Telehealth: Payer: Self-pay

## 2020-04-29 DIAGNOSIS — D509 Iron deficiency anemia, unspecified: Secondary | ICD-10-CM | POA: Diagnosis present

## 2020-04-29 DIAGNOSIS — I4891 Unspecified atrial fibrillation: Secondary | ICD-10-CM | POA: Diagnosis present

## 2020-04-29 DIAGNOSIS — I050 Rheumatic mitral stenosis: Secondary | ICD-10-CM

## 2020-04-29 DIAGNOSIS — E876 Hypokalemia: Secondary | ICD-10-CM | POA: Diagnosis not present

## 2020-04-29 DIAGNOSIS — I272 Pulmonary hypertension, unspecified: Secondary | ICD-10-CM | POA: Diagnosis present

## 2020-04-29 DIAGNOSIS — G4733 Obstructive sleep apnea (adult) (pediatric): Secondary | ICD-10-CM | POA: Diagnosis present

## 2020-04-29 DIAGNOSIS — J9811 Atelectasis: Secondary | ICD-10-CM | POA: Diagnosis not present

## 2020-04-29 DIAGNOSIS — N183 Chronic kidney disease, stage 3 unspecified: Secondary | ICD-10-CM | POA: Diagnosis present

## 2020-04-29 DIAGNOSIS — Z8679 Personal history of other diseases of the circulatory system: Secondary | ICD-10-CM

## 2020-04-29 DIAGNOSIS — I10 Essential (primary) hypertension: Secondary | ICD-10-CM | POA: Diagnosis present

## 2020-04-29 DIAGNOSIS — E785 Hyperlipidemia, unspecified: Secondary | ICD-10-CM | POA: Diagnosis present

## 2020-04-29 DIAGNOSIS — D62 Acute posthemorrhagic anemia: Secondary | ICD-10-CM | POA: Diagnosis not present

## 2020-04-29 DIAGNOSIS — R570 Cardiogenic shock: Secondary | ICD-10-CM | POA: Diagnosis not present

## 2020-04-29 DIAGNOSIS — J849 Interstitial pulmonary disease, unspecified: Secondary | ICD-10-CM | POA: Diagnosis present

## 2020-04-29 DIAGNOSIS — I5033 Acute on chronic diastolic (congestive) heart failure: Secondary | ICD-10-CM | POA: Diagnosis not present

## 2020-04-29 DIAGNOSIS — J9690 Respiratory failure, unspecified, unspecified whether with hypoxia or hypercapnia: Secondary | ICD-10-CM

## 2020-04-29 DIAGNOSIS — J810 Acute pulmonary edema: Secondary | ICD-10-CM | POA: Diagnosis not present

## 2020-04-29 DIAGNOSIS — I509 Heart failure, unspecified: Secondary | ICD-10-CM

## 2020-04-29 DIAGNOSIS — I5030 Unspecified diastolic (congestive) heart failure: Secondary | ICD-10-CM | POA: Diagnosis present

## 2020-04-29 DIAGNOSIS — K219 Gastro-esophageal reflux disease without esophagitis: Secondary | ICD-10-CM | POA: Diagnosis present

## 2020-04-29 DIAGNOSIS — R34 Anuria and oliguria: Secondary | ICD-10-CM | POA: Diagnosis not present

## 2020-04-29 DIAGNOSIS — D500 Iron deficiency anemia secondary to blood loss (chronic): Secondary | ICD-10-CM | POA: Diagnosis present

## 2020-04-29 DIAGNOSIS — Z87891 Personal history of nicotine dependence: Secondary | ICD-10-CM

## 2020-04-29 DIAGNOSIS — F419 Anxiety disorder, unspecified: Secondary | ICD-10-CM | POA: Diagnosis present

## 2020-04-29 DIAGNOSIS — I130 Hypertensive heart and chronic kidney disease with heart failure and stage 1 through stage 4 chronic kidney disease, or unspecified chronic kidney disease: Secondary | ICD-10-CM | POA: Diagnosis present

## 2020-04-29 DIAGNOSIS — I480 Paroxysmal atrial fibrillation: Secondary | ICD-10-CM | POA: Diagnosis present

## 2020-04-29 DIAGNOSIS — I471 Supraventricular tachycardia: Secondary | ICD-10-CM | POA: Diagnosis not present

## 2020-04-29 DIAGNOSIS — N170 Acute kidney failure with tubular necrosis: Secondary | ICD-10-CM | POA: Diagnosis not present

## 2020-04-29 DIAGNOSIS — N1832 Chronic kidney disease, stage 3b: Secondary | ICD-10-CM | POA: Diagnosis present

## 2020-04-29 DIAGNOSIS — Z7901 Long term (current) use of anticoagulants: Secondary | ICD-10-CM

## 2020-04-29 DIAGNOSIS — Z803 Family history of malignant neoplasm of breast: Secondary | ICD-10-CM

## 2020-04-29 DIAGNOSIS — I4819 Other persistent atrial fibrillation: Secondary | ICD-10-CM | POA: Diagnosis present

## 2020-04-29 DIAGNOSIS — K567 Ileus, unspecified: Secondary | ICD-10-CM

## 2020-04-29 DIAGNOSIS — K5900 Constipation, unspecified: Secondary | ICD-10-CM | POA: Diagnosis not present

## 2020-04-29 DIAGNOSIS — J9601 Acute respiratory failure with hypoxia: Secondary | ICD-10-CM | POA: Diagnosis not present

## 2020-04-29 DIAGNOSIS — Z96653 Presence of artificial knee joint, bilateral: Secondary | ICD-10-CM | POA: Diagnosis present

## 2020-04-29 DIAGNOSIS — Z885 Allergy status to narcotic agent status: Secondary | ICD-10-CM

## 2020-04-29 DIAGNOSIS — T8111XA Postprocedural  cardiogenic shock, initial encounter: Secondary | ICD-10-CM | POA: Diagnosis not present

## 2020-04-29 DIAGNOSIS — Z8261 Family history of arthritis: Secondary | ICD-10-CM

## 2020-04-29 DIAGNOSIS — E871 Hypo-osmolality and hyponatremia: Secondary | ICD-10-CM | POA: Diagnosis not present

## 2020-04-29 DIAGNOSIS — Z952 Presence of prosthetic heart valve: Secondary | ICD-10-CM

## 2020-04-29 DIAGNOSIS — D72829 Elevated white blood cell count, unspecified: Secondary | ICD-10-CM | POA: Diagnosis not present

## 2020-04-29 DIAGNOSIS — I340 Nonrheumatic mitral (valve) insufficiency: Secondary | ICD-10-CM | POA: Diagnosis not present

## 2020-04-29 DIAGNOSIS — I342 Nonrheumatic mitral (valve) stenosis: Secondary | ICD-10-CM | POA: Diagnosis not present

## 2020-04-29 DIAGNOSIS — T502X5A Adverse effect of carbonic-anhydrase inhibitors, benzothiadiazides and other diuretics, initial encounter: Secondary | ICD-10-CM | POA: Diagnosis not present

## 2020-04-29 DIAGNOSIS — Z8249 Family history of ischemic heart disease and other diseases of the circulatory system: Secondary | ICD-10-CM

## 2020-04-29 DIAGNOSIS — Z9889 Other specified postprocedural states: Secondary | ICD-10-CM

## 2020-04-29 DIAGNOSIS — N179 Acute kidney failure, unspecified: Secondary | ICD-10-CM | POA: Diagnosis not present

## 2020-04-29 DIAGNOSIS — I440 Atrioventricular block, first degree: Secondary | ICD-10-CM | POA: Diagnosis not present

## 2020-04-29 DIAGNOSIS — Z6841 Body Mass Index (BMI) 40.0 and over, adult: Secondary | ICD-10-CM

## 2020-04-29 DIAGNOSIS — I5032 Chronic diastolic (congestive) heart failure: Secondary | ICD-10-CM | POA: Diagnosis present

## 2020-04-29 DIAGNOSIS — Z6839 Body mass index (BMI) 39.0-39.9, adult: Secondary | ICD-10-CM

## 2020-04-29 DIAGNOSIS — Z79899 Other long term (current) drug therapy: Secondary | ICD-10-CM

## 2020-04-29 DIAGNOSIS — I5031 Acute diastolic (congestive) heart failure: Secondary | ICD-10-CM | POA: Diagnosis not present

## 2020-04-29 DIAGNOSIS — Z954 Presence of other heart-valve replacement: Secondary | ICD-10-CM | POA: Diagnosis not present

## 2020-04-29 HISTORY — PX: MAZE: SHX5063

## 2020-04-29 HISTORY — DX: Personal history of other diseases of the circulatory system: Z86.79

## 2020-04-29 HISTORY — PX: MITRAL VALVE REPLACEMENT: SHX147

## 2020-04-29 HISTORY — PX: TEE WITHOUT CARDIOVERSION: SHX5443

## 2020-04-29 HISTORY — PX: CLIPPING OF ATRIAL APPENDAGE: SHX5773

## 2020-04-29 HISTORY — DX: Presence of other heart-valve replacement: Z95.4

## 2020-04-29 HISTORY — DX: Other specified postprocedural states: Z98.890

## 2020-04-29 LAB — CBC
HCT: 30.8 % — ABNORMAL LOW (ref 36.0–46.0)
HCT: 28.1 % — ABNORMAL LOW (ref 36.0–46.0)
Hemoglobin: 10.3 g/dL — ABNORMAL LOW (ref 12.0–15.0)
Hemoglobin: 9.2 g/dL — ABNORMAL LOW (ref 12.0–15.0)
MCH: 29.4 pg (ref 26.0–34.0)
MCH: 29.4 pg (ref 26.0–34.0)
MCHC: 33.4 g/dL (ref 30.0–36.0)
MCHC: 32.7 g/dL (ref 30.0–36.0)
MCV: 88 fL (ref 80.0–100.0)
MCV: 89.8 fL (ref 80.0–100.0)
Platelets: 169 10*3/uL (ref 150–400)
Platelets: 166 10*3/uL (ref 150–400)
RBC: 3.5 MIL/uL — ABNORMAL LOW (ref 3.87–5.11)
RBC: 3.13 MIL/uL — ABNORMAL LOW (ref 3.87–5.11)
RDW: 14.3 % (ref 11.5–15.5)
RDW: 15 % (ref 11.5–15.5)
WBC: 13 10*3/uL — ABNORMAL HIGH (ref 4.0–10.5)
WBC: 12.9 10*3/uL — ABNORMAL HIGH (ref 4.0–10.5)
nRBC: 0 % (ref 0.0–0.2)
nRBC: 0 % (ref 0.0–0.2)

## 2020-04-29 LAB — ECHO INTRAOPERATIVE TEE
AV Mean grad: 3 mmHg
AV Peak grad: 5.7 mmHg
Ao pk vel: 1.19 m/s
Area-P 1/2: 1.34 cm2
Height: 64 in
MV M vel: 4.17 m/s
MV Peak grad: 69.6 mmHg
Radius: 0.7 cm
S' Lateral: 1.91 cm
Weight: 3925.95 oz

## 2020-04-29 LAB — POCT I-STAT 7, (LYTES, BLD GAS, ICA,H+H)
Acid-Base Excess: 4 mmol/L — ABNORMAL HIGH (ref 0.0–2.0)
Acid-Base Excess: 6 mmol/L — ABNORMAL HIGH (ref 0.0–2.0)
Acid-Base Excess: 6 mmol/L — ABNORMAL HIGH (ref 0.0–2.0)
Acid-Base Excess: 1 mmol/L (ref 0.0–2.0)
Acid-Base Excess: 2 mmol/L (ref 0.0–2.0)
Acid-base deficit: 5 mmol/L — ABNORMAL HIGH (ref 0.0–2.0)
Bicarbonate: 30.2 mmol/L — ABNORMAL HIGH (ref 20.0–28.0)
Bicarbonate: 30.7 mmol/L — ABNORMAL HIGH (ref 20.0–28.0)
Bicarbonate: 31.1 mmol/L — ABNORMAL HIGH (ref 20.0–28.0)
Bicarbonate: 26.1 mmol/L (ref 20.0–28.0)
Bicarbonate: 27.2 mmol/L (ref 20.0–28.0)
Bicarbonate: 22.1 mmol/L (ref 20.0–28.0)
Calcium, Ion: 1.16 mmol/L (ref 1.15–1.40)
Calcium, Ion: 0.95 mmol/L — ABNORMAL LOW (ref 1.15–1.40)
Calcium, Ion: 0.99 mmol/L — ABNORMAL LOW (ref 1.15–1.40)
Calcium, Ion: 0.92 mmol/L — ABNORMAL LOW (ref 1.15–1.40)
Calcium, Ion: 1.03 mmol/L — ABNORMAL LOW (ref 1.15–1.40)
Calcium, Ion: 1.04 mmol/L — ABNORMAL LOW (ref 1.15–1.40)
HCT: 31 % — ABNORMAL LOW (ref 36.0–46.0)
HCT: 23 % — ABNORMAL LOW (ref 36.0–46.0)
HCT: 27 % — ABNORMAL LOW (ref 36.0–46.0)
HCT: 28 % — ABNORMAL LOW (ref 36.0–46.0)
HCT: 31 % — ABNORMAL LOW (ref 36.0–46.0)
HCT: 27 % — ABNORMAL LOW (ref 36.0–46.0)
Hemoglobin: 10.5 g/dL — ABNORMAL LOW (ref 12.0–15.0)
Hemoglobin: 7.8 g/dL — ABNORMAL LOW (ref 12.0–15.0)
Hemoglobin: 9.2 g/dL — ABNORMAL LOW (ref 12.0–15.0)
Hemoglobin: 9.5 g/dL — ABNORMAL LOW (ref 12.0–15.0)
Hemoglobin: 10.5 g/dL — ABNORMAL LOW (ref 12.0–15.0)
Hemoglobin: 9.2 g/dL — ABNORMAL LOW (ref 12.0–15.0)
O2 Saturation: 100 %
O2 Saturation: 100 %
O2 Saturation: 100 %
O2 Saturation: 98 %
O2 Saturation: 100 %
O2 Saturation: 96 %
Patient temperature: 36.3
Potassium: 3.3 mmol/L — ABNORMAL LOW (ref 3.5–5.1)
Potassium: 3 mmol/L — ABNORMAL LOW (ref 3.5–5.1)
Potassium: 3.7 mmol/L (ref 3.5–5.1)
Potassium: 3.7 mmol/L (ref 3.5–5.1)
Potassium: 3.5 mmol/L (ref 3.5–5.1)
Potassium: 3.9 mmol/L (ref 3.5–5.1)
Sodium: 140 mmol/L (ref 135–145)
Sodium: 139 mmol/L (ref 135–145)
Sodium: 141 mmol/L (ref 135–145)
Sodium: 140 mmol/L (ref 135–145)
Sodium: 140 mmol/L (ref 135–145)
Sodium: 143 mmol/L (ref 135–145)
TCO2: 32 mmol/L (ref 22–32)
TCO2: 32 mmol/L (ref 22–32)
TCO2: 33 mmol/L — ABNORMAL HIGH (ref 22–32)
TCO2: 27 mmol/L (ref 22–32)
TCO2: 29 mmol/L (ref 22–32)
TCO2: 24 mmol/L (ref 22–32)
pCO2 arterial: 49.8 mmHg — ABNORMAL HIGH (ref 32.0–48.0)
pCO2 arterial: 46.3 mmHg (ref 32.0–48.0)
pCO2 arterial: 46.8 mmHg (ref 32.0–48.0)
pCO2 arterial: 41.5 mmHg (ref 32.0–48.0)
pCO2 arterial: 45.4 mmHg (ref 32.0–48.0)
pCO2 arterial: 47.5 mmHg (ref 32.0–48.0)
pH, Arterial: 7.391 (ref 7.350–7.450)
pH, Arterial: 7.429 (ref 7.350–7.450)
pH, Arterial: 7.43 (ref 7.350–7.450)
pH, Arterial: 7.407 (ref 7.350–7.450)
pH, Arterial: 7.382 (ref 7.350–7.450)
pH, Arterial: 7.276 — ABNORMAL LOW (ref 7.350–7.450)
pO2, Arterial: 384 mmHg — ABNORMAL HIGH (ref 83.0–108.0)
pO2, Arterial: 349 mmHg — ABNORMAL HIGH (ref 83.0–108.0)
pO2, Arterial: 372 mmHg — ABNORMAL HIGH (ref 83.0–108.0)
pO2, Arterial: 99 mmHg (ref 83.0–108.0)
pO2, Arterial: 197 mmHg — ABNORMAL HIGH (ref 83.0–108.0)
pO2, Arterial: 97 mmHg (ref 83.0–108.0)

## 2020-04-29 LAB — PROTIME-INR
INR: 1.7 — ABNORMAL HIGH (ref 0.8–1.2)
INR: 2.3 — ABNORMAL HIGH (ref 0.8–1.2)
Prothrombin Time: 19.1 seconds — ABNORMAL HIGH (ref 11.4–15.2)
Prothrombin Time: 25.6 seconds — ABNORMAL HIGH (ref 11.4–15.2)

## 2020-04-29 LAB — POCT I-STAT, CHEM 8
BUN: 19 mg/dL (ref 6–20)
BUN: 18 mg/dL (ref 6–20)
BUN: 19 mg/dL (ref 6–20)
BUN: 20 mg/dL (ref 6–20)
BUN: 21 mg/dL — ABNORMAL HIGH (ref 6–20)
BUN: 20 mg/dL (ref 6–20)
Calcium, Ion: 1.16 mmol/L (ref 1.15–1.40)
Calcium, Ion: 1.09 mmol/L — ABNORMAL LOW (ref 1.15–1.40)
Calcium, Ion: 1.01 mmol/L — ABNORMAL LOW (ref 1.15–1.40)
Calcium, Ion: 0.95 mmol/L — ABNORMAL LOW (ref 1.15–1.40)
Calcium, Ion: 0.99 mmol/L — ABNORMAL LOW (ref 1.15–1.40)
Calcium, Ion: 0.94 mmol/L — ABNORMAL LOW (ref 1.15–1.40)
Chloride: 100 mmol/L (ref 98–111)
Chloride: 101 mmol/L (ref 98–111)
Chloride: 98 mmol/L (ref 98–111)
Chloride: 100 mmol/L (ref 98–111)
Chloride: 99 mmol/L (ref 98–111)
Chloride: 101 mmol/L (ref 98–111)
Creatinine, Ser: 1.1 mg/dL — ABNORMAL HIGH (ref 0.44–1.00)
Creatinine, Ser: 1.1 mg/dL — ABNORMAL HIGH (ref 0.44–1.00)
Creatinine, Ser: 1 mg/dL (ref 0.44–1.00)
Creatinine, Ser: 1 mg/dL (ref 0.44–1.00)
Creatinine, Ser: 1.1 mg/dL — ABNORMAL HIGH (ref 0.44–1.00)
Creatinine, Ser: 1 mg/dL (ref 0.44–1.00)
Glucose, Bld: 119 mg/dL — ABNORMAL HIGH (ref 70–99)
Glucose, Bld: 127 mg/dL — ABNORMAL HIGH (ref 70–99)
Glucose, Bld: 126 mg/dL — ABNORMAL HIGH (ref 70–99)
Glucose, Bld: 162 mg/dL — ABNORMAL HIGH (ref 70–99)
Glucose, Bld: 163 mg/dL — ABNORMAL HIGH (ref 70–99)
Glucose, Bld: 166 mg/dL — ABNORMAL HIGH (ref 70–99)
HCT: 29 % — ABNORMAL LOW (ref 36.0–46.0)
HCT: 26 % — ABNORMAL LOW (ref 36.0–46.0)
HCT: 26 % — ABNORMAL LOW (ref 36.0–46.0)
HCT: 21 % — ABNORMAL LOW (ref 36.0–46.0)
HCT: 22 % — ABNORMAL LOW (ref 36.0–46.0)
HCT: 30 % — ABNORMAL LOW (ref 36.0–46.0)
Hemoglobin: 9.9 g/dL — ABNORMAL LOW (ref 12.0–15.0)
Hemoglobin: 8.8 g/dL — ABNORMAL LOW (ref 12.0–15.0)
Hemoglobin: 8.8 g/dL — ABNORMAL LOW (ref 12.0–15.0)
Hemoglobin: 7.1 g/dL — ABNORMAL LOW (ref 12.0–15.0)
Hemoglobin: 7.5 g/dL — ABNORMAL LOW (ref 12.0–15.0)
Hemoglobin: 10.2 g/dL — ABNORMAL LOW (ref 12.0–15.0)
Potassium: 3.3 mmol/L — ABNORMAL LOW (ref 3.5–5.1)
Potassium: 3.1 mmol/L — ABNORMAL LOW (ref 3.5–5.1)
Potassium: 4 mmol/L (ref 3.5–5.1)
Potassium: 3.7 mmol/L (ref 3.5–5.1)
Potassium: 4.3 mmol/L (ref 3.5–5.1)
Potassium: 3.7 mmol/L (ref 3.5–5.1)
Sodium: 140 mmol/L (ref 135–145)
Sodium: 140 mmol/L (ref 135–145)
Sodium: 139 mmol/L (ref 135–145)
Sodium: 138 mmol/L (ref 135–145)
Sodium: 139 mmol/L (ref 135–145)
Sodium: 141 mmol/L (ref 135–145)
TCO2: 29 mmol/L (ref 22–32)
TCO2: 28 mmol/L (ref 22–32)
TCO2: 30 mmol/L (ref 22–32)
TCO2: 30 mmol/L (ref 22–32)
TCO2: 31 mmol/L (ref 22–32)
TCO2: 26 mmol/L (ref 22–32)

## 2020-04-29 LAB — POCT I-STAT EG7
Acid-Base Excess: 4 mmol/L — ABNORMAL HIGH (ref 0.0–2.0)
Bicarbonate: 29.5 mmol/L — ABNORMAL HIGH (ref 20.0–28.0)
Calcium, Ion: 1 mmol/L — ABNORMAL LOW (ref 1.15–1.40)
HCT: 23 % — ABNORMAL LOW (ref 36.0–46.0)
Hemoglobin: 7.8 g/dL — ABNORMAL LOW (ref 12.0–15.0)
O2 Saturation: 83 %
Potassium: 3.9 mmol/L (ref 3.5–5.1)
Sodium: 138 mmol/L (ref 135–145)
TCO2: 31 mmol/L (ref 22–32)
pCO2, Ven: 50.9 mmHg (ref 44.0–60.0)
pH, Ven: 7.371 (ref 7.250–7.430)
pO2, Ven: 49 mmHg — ABNORMAL HIGH (ref 32.0–45.0)

## 2020-04-29 LAB — BASIC METABOLIC PANEL
Anion gap: 5 (ref 5–15)
BUN: 16 mg/dL (ref 6–20)
CO2: 23 mmol/L (ref 22–32)
Calcium: 7.2 mg/dL — ABNORMAL LOW (ref 8.9–10.3)
Chloride: 111 mmol/L (ref 98–111)
Creatinine, Ser: 1.41 mg/dL — ABNORMAL HIGH (ref 0.44–1.00)
GFR, Estimated: 45 mL/min — ABNORMAL LOW (ref >60)
Glucose, Bld: 160 mg/dL — ABNORMAL HIGH (ref 70–99)
Potassium: 4.3 mmol/L (ref 3.5–5.1)
Sodium: 139 mmol/L (ref 135–145)

## 2020-04-29 LAB — GLUCOSE, CAPILLARY
Glucose-Capillary: 115 mg/dL — ABNORMAL HIGH (ref 70–99)
Glucose-Capillary: 125 mg/dL — ABNORMAL HIGH (ref 70–99)
Glucose-Capillary: 157 mg/dL — ABNORMAL HIGH (ref 70–99)
Glucose-Capillary: 158 mg/dL — ABNORMAL HIGH (ref 70–99)
Glucose-Capillary: 160 mg/dL — ABNORMAL HIGH (ref 70–99)
Glucose-Capillary: 163 mg/dL — ABNORMAL HIGH (ref 70–99)
Glucose-Capillary: 167 mg/dL — ABNORMAL HIGH (ref 70–99)
Glucose-Capillary: 185 mg/dL — ABNORMAL HIGH (ref 70–99)
Glucose-Capillary: 186 mg/dL — ABNORMAL HIGH (ref 70–99)
Glucose-Capillary: 189 mg/dL — ABNORMAL HIGH (ref 70–99)
Glucose-Capillary: 189 mg/dL — ABNORMAL HIGH (ref 70–99)
Glucose-Capillary: 83 mg/dL (ref 70–99)

## 2020-04-29 LAB — PREPARE RBC (CROSSMATCH)

## 2020-04-29 LAB — HEMOGLOBIN AND HEMATOCRIT, BLOOD
HCT: 20.2 % — ABNORMAL LOW (ref 36.0–46.0)
Hemoglobin: 6.7 g/dL — CL (ref 12.0–15.0)

## 2020-04-29 LAB — PLATELET COUNT: Platelets: 158 10*3/uL (ref 150–400)

## 2020-04-29 LAB — MAGNESIUM: Magnesium: 3.5 mg/dL — ABNORMAL HIGH (ref 1.7–2.4)

## 2020-04-29 LAB — APTT: aPTT: 32 seconds (ref 24–36)

## 2020-04-29 SURGERY — REPLACEMENT, MITRAL VALVE
Anesthesia: General | Site: Chest

## 2020-04-29 MED ORDER — PHENYLEPHRINE HCL-NACL 20-0.9 MG/250ML-% IV SOLN
0.0000 ug/min | INTRAVENOUS | Status: DC
  Administered 2020-04-29: 30 ug/min via INTRAVENOUS
  Filled 2020-04-29: qty 250

## 2020-04-29 MED ORDER — TRAMADOL HCL 50 MG PO TABS
50.0000 mg | ORAL_TABLET | ORAL | Status: DC | PRN
  Administered 2020-04-30 (×2): 100 mg via ORAL
  Administered 2020-05-03: 50 mg via ORAL
  Administered 2020-05-04 – 2020-05-05 (×4): 100 mg via ORAL
  Administered 2020-05-06: 50 mg via ORAL
  Administered 2020-05-06 – 2020-05-07 (×3): 100 mg via ORAL
  Administered 2020-05-08: 50 mg via ORAL
  Administered 2020-05-08 – 2020-05-10 (×4): 100 mg via ORAL
  Filled 2020-04-29: qty 1
  Filled 2020-04-29 (×2): qty 2
  Filled 2020-04-29: qty 1
  Filled 2020-04-29 (×2): qty 2
  Filled 2020-04-29: qty 1
  Filled 2020-04-29 (×2): qty 2
  Filled 2020-04-29: qty 1
  Filled 2020-04-29 (×5): qty 2
  Filled 2020-04-29 (×2): qty 1
  Filled 2020-04-29: qty 2

## 2020-04-29 MED ORDER — LACTATED RINGERS IV SOLN: INTRAVENOUS | Status: DC | PRN

## 2020-04-29 MED ORDER — SODIUM CHLORIDE (PF) 0.9 % IJ SOLN
INTRAMUSCULAR | Status: AC
  Filled 2020-04-29: qty 10

## 2020-04-29 MED ORDER — VANCOMYCIN HCL 1000 MG IV SOLR
INTRAVENOUS | Status: DC | PRN
  Administered 2020-04-29: 1000 mL

## 2020-04-29 MED ORDER — METOPROLOL TARTRATE 12.5 MG HALF TABLET
12.5000 mg | ORAL_TABLET | Freq: Once | ORAL | Status: DC
  Filled 2020-04-29: qty 1

## 2020-04-29 MED ORDER — PLASMA-LYTE 148 IV SOLN
INTRAVENOUS | Status: DC | PRN
  Administered 2020-04-29: 500 mL via INTRAVASCULAR

## 2020-04-29 MED ORDER — CHLORHEXIDINE GLUCONATE 4 % EX LIQD: 30.0000 mL | CUTANEOUS | Status: DC

## 2020-04-29 MED ORDER — VANCOMYCIN HCL IN DEXTROSE 1-5 GM/200ML-% IV SOLN
1000.0000 mg | Freq: Once | INTRAVENOUS | Status: AC
  Administered 2020-04-29: 1000 mg via INTRAVENOUS
  Filled 2020-04-29: qty 200

## 2020-04-29 MED ORDER — ROCURONIUM BROMIDE 10 MG/ML (PF) SYRINGE
PREFILLED_SYRINGE | INTRAVENOUS | Status: AC
  Filled 2020-04-29: qty 30

## 2020-04-29 MED ORDER — PROPOFOL 10 MG/ML IV BOLUS
INTRAVENOUS | Status: AC
  Filled 2020-04-29: qty 20

## 2020-04-29 MED ORDER — ARTIFICIAL TEARS OPHTHALMIC OINT
TOPICAL_OINTMENT | OPHTHALMIC | Status: DC | PRN
  Administered 2020-04-29: 1 via OPHTHALMIC

## 2020-04-29 MED ORDER — ROCURONIUM BROMIDE 10 MG/ML (PF) SYRINGE
PREFILLED_SYRINGE | INTRAVENOUS | Status: DC | PRN
  Administered 2020-04-29 (×4): 50 mg via INTRAVENOUS
  Administered 2020-04-29: 100 mg via INTRAVENOUS

## 2020-04-29 MED ORDER — ACETAMINOPHEN 160 MG/5ML PO SOLN
650.0000 mg | Freq: Once | ORAL | Status: AC
  Administered 2020-04-29: 650 mg

## 2020-04-29 MED ORDER — SODIUM CHLORIDE 0.9 % IV SOLN: INTRAVENOUS | Status: DC | PRN

## 2020-04-29 MED ORDER — ALBUMIN HUMAN 5 % IV SOLN: INTRAVENOUS | Status: DC | PRN

## 2020-04-29 MED ORDER — DOCUSATE SODIUM 100 MG PO CAPS
200.0000 mg | ORAL_CAPSULE | Freq: Every day | ORAL | Status: DC
  Administered 2020-04-30 – 2020-05-11 (×11): 200 mg via ORAL
  Filled 2020-04-29 (×12): qty 2

## 2020-04-29 MED ORDER — ONDANSETRON HCL 4 MG/2ML IJ SOLN
4.0000 mg | Freq: Four times a day (QID) | INTRAMUSCULAR | Status: DC | PRN
  Administered 2020-05-02 – 2020-05-08 (×7): 4 mg via INTRAVENOUS
  Filled 2020-04-29 (×7): qty 2

## 2020-04-29 MED ORDER — MIDAZOLAM HCL (PF) 10 MG/2ML IJ SOLN
INTRAMUSCULAR | Status: AC
  Filled 2020-04-29: qty 2

## 2020-04-29 MED ORDER — MILRINONE LACTATE IN DEXTROSE 20-5 MG/100ML-% IV SOLN
0.1250 ug/kg/min | INTRAVENOUS | Status: DC
  Administered 2020-04-29 – 2020-04-30 (×3): 0.375 ug/kg/min via INTRAVENOUS
  Administered 2020-04-30: 0.5 ug/kg/min via INTRAVENOUS
  Administered 2020-05-01: 0.3 ug/kg/min via INTRAVENOUS
  Administered 2020-05-01: 0.5 ug/kg/min via INTRAVENOUS
  Administered 2020-05-01 – 2020-05-03 (×5): 0.3 ug/kg/min via INTRAVENOUS
  Administered 2020-05-03 – 2020-05-04 (×5): 0.4 ug/kg/min via INTRAVENOUS
  Administered 2020-05-05 (×2): 0.3 ug/kg/min via INTRAVENOUS
  Administered 2020-05-05: 0.4 ug/kg/min via INTRAVENOUS
  Administered 2020-05-06: 0.3 ug/kg/min via INTRAVENOUS
  Administered 2020-05-07: 0.2 ug/kg/min via INTRAVENOUS
  Administered 2020-05-07: 0.3 ug/kg/min via INTRAVENOUS
  Administered 2020-05-08 – 2020-05-09 (×2): 0.125 ug/kg/min via INTRAVENOUS
  Filled 2020-04-29 (×24): qty 100

## 2020-04-29 MED ORDER — FAMOTIDINE IN NACL 20-0.9 MG/50ML-% IV SOLN
20.0000 mg | Freq: Two times a day (BID) | INTRAVENOUS | Status: AC
  Administered 2020-04-29 (×2): 20 mg via INTRAVENOUS
  Filled 2020-04-29 (×2): qty 50

## 2020-04-29 MED ORDER — PANTOPRAZOLE SODIUM 40 MG PO TBEC: 40.0000 mg | DELAYED_RELEASE_TABLET | Freq: Every day | ORAL | Status: DC

## 2020-04-29 MED ORDER — SODIUM CHLORIDE 0.9 % IV SOLN
250.0000 mL | INTRAVENOUS | Status: DC
  Administered 2020-05-04 – 2020-05-06 (×2): 250 mL via INTRAVENOUS

## 2020-04-29 MED ORDER — PROTAMINE SULFATE 10 MG/ML IV SOLN
INTRAVENOUS | Status: DC | PRN
  Administered 2020-04-29: 20 mg via INTRAVENOUS
  Administered 2020-04-29: 370 mg via INTRAVENOUS

## 2020-04-29 MED ORDER — SODIUM CHLORIDE 0.45 % IV SOLN: INTRAVENOUS | Status: DC | PRN

## 2020-04-29 MED ORDER — CHLORHEXIDINE GLUCONATE CLOTH 2 % EX PADS
6.0000 | MEDICATED_PAD | Freq: Every day | CUTANEOUS | Status: DC
  Administered 2020-04-29 – 2020-05-11 (×10): 6 via TOPICAL

## 2020-04-29 MED ORDER — SODIUM CHLORIDE 0.9% FLUSH
3.0000 mL | Freq: Two times a day (BID) | INTRAVENOUS | Status: DC
  Administered 2020-04-30 – 2020-05-09 (×13): 3 mL via INTRAVENOUS
  Administered 2020-05-10: 10 mL via INTRAVENOUS
  Administered 2020-05-11: 3 mL via INTRAVENOUS

## 2020-04-29 MED ORDER — SODIUM CHLORIDE 0.9 % IV SOLN: INTRAVENOUS | Status: DC

## 2020-04-29 MED ORDER — BISACODYL 5 MG PO TBEC
10.0000 mg | DELAYED_RELEASE_TABLET | Freq: Every day | ORAL | Status: DC
  Administered 2020-04-30 – 2020-05-09 (×8): 10 mg via ORAL
  Filled 2020-04-29 (×11): qty 2

## 2020-04-29 MED ORDER — METOCLOPRAMIDE HCL 5 MG/ML IJ SOLN
10.0000 mg | Freq: Four times a day (QID) | INTRAMUSCULAR | Status: DC
  Administered 2020-04-29 – 2020-04-30 (×3): 10 mg via INTRAVENOUS
  Filled 2020-04-29 (×3): qty 2

## 2020-04-29 MED ORDER — ALBUTEROL SULFATE (2.5 MG/3ML) 0.083% IN NEBU
INHALATION_SOLUTION | RESPIRATORY_TRACT | Status: AC
  Filled 2020-04-29: qty 3

## 2020-04-29 MED ORDER — DEXMEDETOMIDINE HCL IN NACL 400 MCG/100ML IV SOLN: 0.0000 ug/kg/h | INTRAVENOUS | Status: DC

## 2020-04-29 MED ORDER — ALPRAZOLAM 0.5 MG PO TABS: 0.5000 mg | ORAL_TABLET | Freq: Every day | ORAL | Status: DC | PRN

## 2020-04-29 MED ORDER — ACETAMINOPHEN 160 MG/5ML PO SOLN
1000.0000 mg | Freq: Four times a day (QID) | ORAL | Status: DC
  Administered 2020-04-29: 1000 mg
  Filled 2020-04-29: qty 40.6

## 2020-04-29 MED ORDER — ALBUMIN HUMAN 5 % IV SOLN
INTRAVENOUS | Status: AC
  Administered 2020-04-29: 12.5 g via INTRAVENOUS
  Filled 2020-04-29: qty 250

## 2020-04-29 MED ORDER — ASPIRIN 81 MG PO CHEW: 324.0000 mg | CHEWABLE_TABLET | Freq: Every day | ORAL | Status: DC

## 2020-04-29 MED ORDER — LACTATED RINGERS IV SOLN: 500.0000 mL | Freq: Once | INTRAVENOUS | Status: DC | PRN

## 2020-04-29 MED ORDER — MIDAZOLAM HCL 2 MG/2ML IJ SOLN: 2.0000 mg | INTRAMUSCULAR | Status: DC | PRN

## 2020-04-29 MED ORDER — ATORVASTATIN CALCIUM 80 MG PO TABS: 80.0000 mg | ORAL_TABLET | Freq: Every evening | ORAL | Status: DC

## 2020-04-29 MED ORDER — DEXTROSE 50 % IV SOLN: 0.0000 mL | INTRAVENOUS | Status: DC | PRN

## 2020-04-29 MED ORDER — BISACODYL 10 MG RE SUPP: 10.0000 mg | Freq: Every day | RECTAL | Status: DC

## 2020-04-29 MED ORDER — INSULIN REGULAR(HUMAN) IN NACL 100-0.9 UT/100ML-% IV SOLN
INTRAVENOUS | Status: DC | PRN
  Administered 2020-04-29: 1 [IU]/h via INTRAVENOUS

## 2020-04-29 MED ORDER — PANTOPRAZOLE SODIUM 40 MG PO TBEC
40.0000 mg | DELAYED_RELEASE_TABLET | Freq: Every day | ORAL | Status: DC
  Administered 2020-05-01 – 2020-05-06 (×6): 40 mg via ORAL
  Filled 2020-04-29 (×6): qty 1

## 2020-04-29 MED ORDER — FLUTICASONE PROPIONATE 50 MCG/ACT NA SUSP: 1.0000 | Freq: Every day | NASAL | Status: DC | PRN

## 2020-04-29 MED ORDER — HEPARIN SODIUM (PORCINE) 1000 UNIT/ML IJ SOLN
INTRAMUSCULAR | Status: DC | PRN
  Administered 2020-04-29: 39000 [IU] via INTRAVENOUS

## 2020-04-29 MED ORDER — LACTATED RINGERS IV SOLN: INTRAVENOUS | Status: DC

## 2020-04-29 MED ORDER — SODIUM CHLORIDE 0.9 % IR SOLN
Status: DC | PRN
  Administered 2020-04-29: 5000 mL

## 2020-04-29 MED ORDER — ASPIRIN EC 325 MG PO TBEC
325.0000 mg | DELAYED_RELEASE_TABLET | Freq: Every day | ORAL | Status: AC
  Administered 2020-04-30: 325 mg via ORAL
  Filled 2020-04-29: qty 1

## 2020-04-29 MED ORDER — MIDAZOLAM HCL (PF) 5 MG/ML IJ SOLN
INTRAMUSCULAR | Status: DC | PRN
  Administered 2020-04-29: 2 mg via INTRAVENOUS
  Administered 2020-04-29 (×3): 1 mg via INTRAVENOUS

## 2020-04-29 MED ORDER — INSULIN REGULAR(HUMAN) IN NACL 100-0.9 UT/100ML-% IV SOLN: INTRAVENOUS | Status: DC

## 2020-04-29 MED ORDER — SODIUM CHLORIDE 0.9 % IV SOLN
1.5000 g | Freq: Two times a day (BID) | INTRAVENOUS | Status: AC
  Administered 2020-04-29 – 2020-05-01 (×4): 1.5 g via INTRAVENOUS
  Filled 2020-04-29 (×3): qty 1.5

## 2020-04-29 MED ORDER — DEXMEDETOMIDINE HCL IN NACL 400 MCG/100ML IV SOLN
0.1000 ug/kg/h | INTRAVENOUS | Status: DC
  Filled 2020-04-29: qty 100

## 2020-04-29 MED ORDER — ALBUTEROL SULFATE HFA 108 (90 BASE) MCG/ACT IN AERS: 2.0000 | INHALATION_SPRAY | Freq: Four times a day (QID) | RESPIRATORY_TRACT | Status: DC | PRN

## 2020-04-29 MED ORDER — SODIUM BICARBONATE 8.4 % IV SOLN
50.0000 meq | Freq: Once | INTRAVENOUS | Status: AC
  Administered 2020-04-29: 50 meq via INTRAVENOUS

## 2020-04-29 MED ORDER — ALBUMIN HUMAN 5 % IV SOLN
250.0000 mL | INTRAVENOUS | Status: AC | PRN
  Administered 2020-04-29 (×3): 12.5 g via INTRAVENOUS
  Filled 2020-04-29: qty 250

## 2020-04-29 MED ORDER — FENTANYL CITRATE (PF) 100 MCG/2ML IJ SOLN
INTRAMUSCULAR | Status: DC | PRN
  Administered 2020-04-29 (×2): 100 ug via INTRAVENOUS
  Administered 2020-04-29: 50 ug via INTRAVENOUS
  Administered 2020-04-29: 100 ug via INTRAVENOUS
  Administered 2020-04-29: 50 ug via INTRAVENOUS
  Administered 2020-04-29: 100 ug via INTRAVENOUS
  Administered 2020-04-29: 50 ug via INTRAVENOUS
  Administered 2020-04-29: 100 ug via INTRAVENOUS
  Administered 2020-04-29: 50 ug via INTRAVENOUS
  Administered 2020-04-29 (×2): 100 ug via INTRAVENOUS
  Administered 2020-04-29: 150 ug via INTRAVENOUS

## 2020-04-29 MED ORDER — PROPOFOL 10 MG/ML IV BOLUS
INTRAVENOUS | Status: DC | PRN
  Administered 2020-04-29: 140 mg via INTRAVENOUS

## 2020-04-29 MED ORDER — OXYCODONE HCL 5 MG PO TABS
5.0000 mg | ORAL_TABLET | ORAL | Status: DC | PRN
Start: 2020-04-29 — End: 2020-04-29

## 2020-04-29 MED ORDER — FENTANYL CITRATE (PF) 250 MCG/5ML IJ SOLN
INTRAMUSCULAR | Status: AC
  Filled 2020-04-29: qty 25

## 2020-04-29 MED ORDER — CHLORHEXIDINE GLUCONATE 0.12 % MT SOLN
15.0000 mL | Freq: Once | OROMUCOSAL | Status: AC
  Administered 2020-04-29: 15 mL via OROMUCOSAL
  Filled 2020-04-29: qty 15

## 2020-04-29 MED ORDER — NITROGLYCERIN IN D5W 200-5 MCG/ML-% IV SOLN: 0.0000 ug/min | INTRAVENOUS | Status: DC

## 2020-04-29 MED ORDER — MAGNESIUM SULFATE 4 GM/100ML IV SOLN
4.0000 g | Freq: Once | INTRAVENOUS | Status: AC
  Administered 2020-04-29: 4 g via INTRAVENOUS
  Filled 2020-04-29: qty 100

## 2020-04-29 MED ORDER — ACETAMINOPHEN 650 MG RE SUPP: 650.0000 mg | Freq: Once | RECTAL | Status: AC

## 2020-04-29 MED ORDER — HEPARIN SODIUM (PORCINE) 1000 UNIT/ML IJ SOLN
INTRAMUSCULAR | Status: AC
  Filled 2020-04-29: qty 1

## 2020-04-29 MED ORDER — SODIUM CHLORIDE 0.9% FLUSH: 3.0000 mL | INTRAVENOUS | Status: DC | PRN

## 2020-04-29 MED ORDER — PROTAMINE SULFATE 10 MG/ML IV SOLN
INTRAVENOUS | Status: AC
  Filled 2020-04-29: qty 15

## 2020-04-29 MED ORDER — PHENYLEPHRINE HCL-NACL 20-0.9 MG/250ML-% IV SOLN
INTRAVENOUS | Status: DC | PRN
  Administered 2020-04-29: 20 ug/min via INTRAVENOUS

## 2020-04-29 MED ORDER — POTASSIUM CHLORIDE 10 MEQ/50ML IV SOLN
10.0000 meq | INTRAVENOUS | Status: AC
  Administered 2020-04-29 (×3): 10 meq via INTRAVENOUS

## 2020-04-29 MED ORDER — PROTAMINE SULFATE 10 MG/ML IV SOLN
INTRAVENOUS | Status: AC
  Filled 2020-04-29: qty 25

## 2020-04-29 MED ORDER — FENTANYL CITRATE (PF) 100 MCG/2ML IJ SOLN
50.0000 ug | INTRAMUSCULAR | Status: DC | PRN
  Administered 2020-04-30: 100 ug via INTRAVENOUS
  Administered 2020-04-30 – 2020-05-07 (×4): 50 ug via INTRAVENOUS
  Filled 2020-04-29 (×5): qty 2

## 2020-04-29 MED ORDER — PHENYLEPHRINE 40 MCG/ML (10ML) SYRINGE FOR IV PUSH (FOR BLOOD PRESSURE SUPPORT)
PREFILLED_SYRINGE | INTRAVENOUS | Status: AC
  Filled 2020-04-29: qty 10

## 2020-04-29 MED ORDER — ACETAMINOPHEN 500 MG PO TABS
1000.0000 mg | ORAL_TABLET | Freq: Four times a day (QID) | ORAL | Status: AC
  Administered 2020-04-30 – 2020-05-04 (×19): 1000 mg via ORAL
  Filled 2020-04-29 (×19): qty 2

## 2020-04-29 MED ORDER — CHLORHEXIDINE GLUCONATE 0.12 % MT SOLN
15.0000 mL | OROMUCOSAL | Status: AC
  Administered 2020-04-29: 15 mL via OROMUCOSAL

## 2020-04-29 MED ORDER — SODIUM CHLORIDE (PF) 0.9 % IJ SOLN
OROMUCOSAL | Status: DC | PRN
  Administered 2020-04-29 (×4): 4 mL via TOPICAL

## 2020-04-29 SURGICAL SUPPLY — 125 items
ADAPTER CARDIO PERF ANTE/RETRO (ADAPTER) ×4 IMPLANT
ADH SKN CLS APL DERMABOND .7 (GAUZE/BANDAGES/DRESSINGS) ×6
ADH SKN CLS LQ APL DERMABOND (GAUZE/BANDAGES/DRESSINGS) ×6
ADPR PRFSN 84XANTGRD RTRGD (ADAPTER) ×3
APL SWBSTK 6 STRL LF DISP (MISCELLANEOUS)
APPLICATOR COTTON TIP 6 STRL (MISCELLANEOUS) IMPLANT
APPLICATOR COTTON TIP 6IN STRL (MISCELLANEOUS)
ARTICLIP LAA PROCLIP II 45 (Clip) ×4 IMPLANT
BAG DECANTER FOR FLEXI CONT (MISCELLANEOUS) ×8 IMPLANT
BLADE CLIPPER SURG (BLADE) IMPLANT
BLADE STERNUM SYSTEM 6 (BLADE) ×4 IMPLANT
BLADE SURG 11 STRL SS (BLADE) ×4 IMPLANT
CANISTER SUCT 3000ML PPV (MISCELLANEOUS) ×4 IMPLANT
CANN PRFSN 3/8X14X24FR PCFC (MISCELLANEOUS)
CANN PRFSN 3/8XCNCT ST RT ANG (MISCELLANEOUS)
CANNULA AORTIC ROOT 9FR (CANNULA) ×4 IMPLANT
CANNULA EZ GLIDE AORTIC 21FR (CANNULA) ×8 IMPLANT
CANNULA GUNDRY RCSP 15FR (MISCELLANEOUS) ×4 IMPLANT
CANNULA PRFSN 3/8X14X24FR PCFC (MISCELLANEOUS) IMPLANT
CANNULA PRFSN 3/8XCNCT RT ANG (MISCELLANEOUS) IMPLANT
CANNULA SUMP PERICARDIAL (CANNULA) ×8 IMPLANT
CANNULA VEN MTL TIP RT (MISCELLANEOUS)
CANNULA VENNOUS METAL TIP 20FR (CANNULA) ×4 IMPLANT
CATH THORACIC 28FR RT ANG (CATHETERS) IMPLANT
CATH THORACIC 36FR (CATHETERS) IMPLANT
CLAMP OLL ABLATION (MISCELLANEOUS) ×4 IMPLANT
CLIP FOGARTY SPRING 6M (CLIP) IMPLANT
CLOSURE PERCLOSE PROSTYLE (VASCULAR PRODUCTS) ×8 IMPLANT
CNTNR URN SCR LID CUP LEK RST (MISCELLANEOUS) ×3 IMPLANT
CONN 1/2X1/2X1/2  BEN (MISCELLANEOUS) ×1
CONN 1/2X1/2X1/2 BEN (MISCELLANEOUS) ×3 IMPLANT
CONN 3/8X1/2 ST GISH (MISCELLANEOUS) ×12 IMPLANT
CONN ST 1/4X3/8  BEN (MISCELLANEOUS) ×1
CONN ST 1/4X3/8 BEN (MISCELLANEOUS) ×3 IMPLANT
CONNECTOR 1/2X3/8X1/2 3 WAY (MISCELLANEOUS) ×1
CONNECTOR 1/2X3/8X1/2 3WAY (MISCELLANEOUS) ×3 IMPLANT
CONT SPEC 4OZ STRL OR WHT (MISCELLANEOUS) ×4
COVER PROBE W GEL 5X96 (DRAPES) ×4 IMPLANT
COVER SURGICAL LIGHT HANDLE (MISCELLANEOUS) ×4 IMPLANT
DEFOGGER ANTIFOG KIT (MISCELLANEOUS) ×4 IMPLANT
DERMABOND ADHESIVE PROPEN (GAUZE/BANDAGES/DRESSINGS) ×2
DERMABOND ADVANCED (GAUZE/BANDAGES/DRESSINGS) ×2
DERMABOND ADVANCED .7 DNX12 (GAUZE/BANDAGES/DRESSINGS) ×6 IMPLANT
DERMABOND ADVANCED .7 DNX6 (GAUZE/BANDAGES/DRESSINGS) ×6 IMPLANT
DEVICE ATRICLIP LAA PRCLPII 45 (Clip) ×3 IMPLANT
DEVICE SUT CK QUICK LOAD MINI (Prosthesis & Implant Heart) ×4 IMPLANT
DRAIN CHANNEL 32F RND 10.7 FF (WOUND CARE) ×4 IMPLANT
DRAPE INCISE IOBAN 66X45 STRL (DRAPES) ×8 IMPLANT
DRAPE SLUSH/WARMER DISC (DRAPES) ×4 IMPLANT
DRSG AQUACEL AG ADV 3.5X14 (GAUZE/BANDAGES/DRESSINGS) ×4 IMPLANT
ELECT REM PT RETURN 9FT ADLT (ELECTROSURGICAL) ×8
ELECTRODE REM PT RTRN 9FT ADLT (ELECTROSURGICAL) ×6 IMPLANT
FELT TEFLON 1X6 (MISCELLANEOUS) ×4 IMPLANT
FIBERTAPE STERNAL CLSR 2 36IN (SUTURE) ×12 IMPLANT
FIBERTAPE STERNAL CLSR 2X36 (SUTURE) ×20 IMPLANT
GAUZE SPONGE 4X4 12PLY STRL (GAUZE/BANDAGES/DRESSINGS) ×4 IMPLANT
GLOVE BIO SURGEON STRL SZ 6 (GLOVE) IMPLANT
GLOVE BIO SURGEON STRL SZ 6.5 (GLOVE) IMPLANT
GLOVE BIO SURGEON STRL SZ7 (GLOVE) IMPLANT
GLOVE BIO SURGEON STRL SZ7.5 (GLOVE) IMPLANT
GLOVE ORTHO TXT STRL SZ7.5 (GLOVE) ×12 IMPLANT
GLOVE SURG MICRO LTX SZ6.5 (GLOVE) ×20 IMPLANT
GLOVE SURG UNDER POLY LF SZ7 (GLOVE) ×4 IMPLANT
GOWN STRL REUS W/ TWL LRG LVL3 (GOWN DISPOSABLE) ×24 IMPLANT
GOWN STRL REUS W/TWL LRG LVL3 (GOWN DISPOSABLE) ×32
HEMOSTAT POWDER SURGIFOAM 1G (HEMOSTASIS) ×16 IMPLANT
INSERT FOGARTY XLG (MISCELLANEOUS) ×4 IMPLANT
IV NS IRRIG 3000ML ARTHROMATIC (IV SOLUTION) ×4 IMPLANT
KIT BASIN OR (CUSTOM PROCEDURE TRAY) ×4 IMPLANT
KIT DILATOR VASC 18G NDL (KITS) ×4 IMPLANT
KIT SUCTION CATH 14FR (SUCTIONS) ×12 IMPLANT
KIT SUT CK MINI COMBO 4X17 (Prosthesis & Implant Heart) ×4 IMPLANT
KIT TURNOVER KIT B (KITS) ×4 IMPLANT
LINE VENT (MISCELLANEOUS) ×4 IMPLANT
LOOP VESSEL SUPERMAXI WHITE (MISCELLANEOUS) ×4 IMPLANT
NDL SUT PASSING CERCLAGE MED (SUTURE) ×8
NEEDLE SUT PASSING CERCLAG MED (SUTURE) ×6 IMPLANT
NS IRRIG 1000ML POUR BTL (IV SOLUTION) ×20 IMPLANT
PACK OPEN HEART (CUSTOM PROCEDURE TRAY) ×4 IMPLANT
PAD ARMBOARD 7.5X6 YLW CONV (MISCELLANEOUS) ×8 IMPLANT
POSITIONER HEAD DONUT 9IN (MISCELLANEOUS) ×4 IMPLANT
PROBE CRYO2-ABLATION MALLABLE (MISCELLANEOUS) ×4 IMPLANT
SET CARDIOPLEGIA MPS 5001102 (MISCELLANEOUS) ×4 IMPLANT
SET IRRIG TUBING LAPAROSCOPIC (IRRIGATION / IRRIGATOR) ×4 IMPLANT
SHEATH PINNACLE 5F 10CM (SHEATH) ×4 IMPLANT
SPONGE LAP 18X18 RF (DISPOSABLE) ×8 IMPLANT
SPONGE LAP 4X18 RFD (DISPOSABLE) ×4 IMPLANT
SUT BONE WAX W31G (SUTURE) ×4 IMPLANT
SUT EB EXC GRN/WHT 2-0 V-5 (SUTURE) ×8 IMPLANT
SUT ETHIBOND 2 0 SH (SUTURE) ×12 IMPLANT
SUT ETHIBOND 2 0 SH 36X2 (SUTURE) ×6 IMPLANT
SUT ETHIBOND 2 0 V4 (SUTURE) IMPLANT
SUT ETHIBOND 2 0V4 GREEN (SUTURE) IMPLANT
SUT ETHIBOND 4 0 TF (SUTURE) IMPLANT
SUT ETHIBOND 5 0 C 1 30 (SUTURE) IMPLANT
SUT ETHIBOND X763 2 0 SH 1 (SUTURE) ×12 IMPLANT
SUT MNCRL AB 3-0 PS2 18 (SUTURE) ×8 IMPLANT
SUT PDS AB 1 CTX 36 (SUTURE) ×8 IMPLANT
SUT PROLENE 3 0 SH 1 (SUTURE) ×4 IMPLANT
SUT PROLENE 3 0 SH DA (SUTURE) ×16 IMPLANT
SUT PROLENE 3 0 SH1 36 (SUTURE) ×24 IMPLANT
SUT PROLENE 4 0 RB 1 (SUTURE) ×20
SUT PROLENE 4 0 SH DA (SUTURE) ×8 IMPLANT
SUT PROLENE 4-0 RB1 .5 CRCL 36 (SUTURE) ×15 IMPLANT
SUT PROLENE 5 0 C 1 36 (SUTURE) ×24 IMPLANT
SUT PROLENE 6 0 C 1 30 (SUTURE) ×8 IMPLANT
SUT SILK  1 MH (SUTURE) ×4
SUT SILK 1 MH (SUTURE) ×12 IMPLANT
SUT STEEL 6MS V (SUTURE) IMPLANT
SUT STEEL STERNAL CCS#1 18IN (SUTURE) IMPLANT
SUT STEEL SZ 6 DBL 3X14 BALL (SUTURE) IMPLANT
SUT VIC AB 1 CTX 18 (SUTURE) ×4 IMPLANT
SUT VIC AB 2-0 CTX 27 (SUTURE) IMPLANT
SUT VIC AB 3-0 SH 8-18 (SUTURE) ×4 IMPLANT
SYSTEM SAHARA CHEST DRAIN ATS (WOUND CARE) ×4 IMPLANT
TAPE CLOTH SURG 4X10 WHT LF (GAUZE/BANDAGES/DRESSINGS) ×4 IMPLANT
TAPE PAPER 2X10 WHT MICROPORE (GAUZE/BANDAGES/DRESSINGS) ×4 IMPLANT
TOWEL GREEN STERILE (TOWEL DISPOSABLE) ×4 IMPLANT
TOWEL GREEN STERILE FF (TOWEL DISPOSABLE) ×4 IMPLANT
TRAY FOLEY SLVR 16FR TEMP STAT (SET/KITS/TRAYS/PACK) ×4 IMPLANT
TUBE SUCT INTRACARD DLP 20F (MISCELLANEOUS) ×4 IMPLANT
UNDERPAD 30X36 HEAVY ABSORB (UNDERPADS AND DIAPERS) ×4 IMPLANT
VALVE ON-X MITRAL 27/29MM (Prosthesis & Implant Heart) ×4 IMPLANT
WATER STERILE IRR 1000ML POUR (IV SOLUTION) ×8 IMPLANT
WIRE HI TORQ VERSACORE-J 145CM (WIRE) ×4 IMPLANT

## 2020-04-29 NOTE — Anesthesia Procedure Notes (Signed)
Procedure Name: Intubation Date/Time: 04/29/2020 7:52 AM Performed by: Thelma Comp, CRNA Pre-anesthesia Checklist: Patient identified, Emergency Drugs available, Suction available and Patient being monitored Patient Re-evaluated:Patient Re-evaluated prior to induction Oxygen Delivery Method: Circle System Utilized Preoxygenation: Pre-oxygenation with 100% oxygen Induction Type: IV induction Ventilation: Mask ventilation without difficulty Laryngoscope Size: Mac and 3 Grade View: Grade I Tube type: Oral Number of attempts: 1 Airway Equipment and Method: Stylet and Oral airway Placement Confirmation: ETT inserted through vocal cords under direct vision,  positive ETCO2 and breath sounds checked- equal and bilateral Secured at: 22 cm Tube secured with: Tape Dental Injury: Teeth and Oropharynx as per pre-operative assessment

## 2020-04-29 NOTE — Transfer of Care (Signed)
Immediate Anesthesia Transfer of Care Note  Patient: Leslie [name]  Procedure(s) Performed: MITRAL VALVE (MV) REPLACEMENT USING ON-X VALVE SIZE 27/29MM (N/A Chest) MAZE (N/A ) TRANSESOPHAGEAL ECHOCARDIOGRAM (TEE) (N/A ) CLIPPING OF ATRIAL APPENDAGE USING ATRICURE  PRO2 CLIP SIZE 45MM (Chest)  Patient Location: SICU  Anesthesia Type:General  Level of Consciousness: Patient remains intubated per anesthesia plan  Airway & Oxygen Therapy: Patient remains intubated per anesthesia plan and Patient placed on Ventilator (see vital sign flow sheet for setting)  Post-op Assessment: Report given to RN and Post -op Vital signs reviewed and stable  Post vital signs: Reviewed and stable  Last Vitals:  Vitals Value Taken Time  BP 95/60 04/29/20 1333  Temp    Pulse 89 04/29/20 1340  Resp 12 04/29/20 1340  SpO2 100 % 04/29/20 1340  Vitals shown include unvalidated device data.  Last Pain:  Vitals:   04/29/20 0609  TempSrc:   PainSc: 0-No pain      Patients Stated Pain Goal: 5 (07/61/51 8343)  Complications: No complications documented.

## 2020-04-29 NOTE — Plan of Care (Signed)

## 2020-04-29 NOTE — Progress Notes (Signed)
Patient ID: Leslie [name], female   DOB: January 27, 1965, 55 y.o.   MRN: [id number]  TCTS Evening Rounds:   Hemodynamically stable  CI = 1.6 but nl LVEF, good BP and urine output. Has received some albumin.  Has started to wake up on vent.    Urine output good  CT output low  CBC    Component Value Date/Time   WBC 13.0 (H) 04/29/2020 1412   RBC 3.50 (L) 04/29/2020 1412   HGB 10.3 (L) 04/29/2020 1412   HGB 11.3 03/07/2020 1039   HCT 30.8 (L) 04/29/2020 1412   HCT 33.7 (L) 03/07/2020 1039   PLT 169 04/29/2020 1412   PLT 447 03/07/2020 1039   MCV 88.0 04/29/2020 1412   MCV 86 03/07/2020 1039   MCV 91 04/05/2012 0827   MCH 29.4 04/29/2020 1412   MCHC 33.4 04/29/2020 1412   RDW 14.3 04/29/2020 1412   RDW 12.7 03/07/2020 1039   RDW 13.2 04/05/2012 0827   LYMPHSABS 2.4 03/07/2020 1039   EOSABS 0.1 03/07/2020 1039   BASOSABS 0.0 03/07/2020 1039     BMET    Component Value Date/Time   NA 141 04/29/2020 1201   NA 140 03/07/2020 1039   NA 138 05/08/2014 1310   K 3.7 04/29/2020 1201   K 3.4 (L) 05/08/2014 1310   CL 101 04/29/2020 1201   CL 99 (L) 05/08/2014 1310   CO2 25 04/25/2020 1030   CO2 29 05/08/2014 1310   GLUCOSE 166 (H) 04/29/2020 1201   GLUCOSE 100 (H) 05/08/2014 1310   BUN 20 04/29/2020 1201   BUN 14 03/07/2020 1039   BUN 14 05/08/2014 1310   CREATININE 1.00 04/29/2020 1201   CREATININE 1.08 (H) 05/08/2014 1310   CALCIUM 8.9 04/25/2020 1030   CALCIUM 9.2 05/08/2014 1310   GFRNONAA 53 (L) 04/25/2020 1030   GFRNONAA >60 05/08/2014 1310   GFRAA 56 (L) 03/07/2020 1039   GFRAA >60 05/08/2014 1310     A/P:  Stable postop course. Continue current plans

## 2020-04-29 NOTE — Progress Notes (Signed)
  Echocardiogram Echocardiogram Transesophageal has been performed.  Leslie [name] 04/29/2020, 8:49 AM

## 2020-04-29 NOTE — Progress Notes (Addendum)
Pt switched back to full vent settings due to pt failed ABG parameters at this time.

## 2020-04-29 NOTE — Anesthesia Postprocedure Evaluation (Signed)
Anesthesia Post Note  Patient: Leslie [name]  Procedure(s) Performed: MITRAL VALVE (MV) REPLACEMENT USING ON-X VALVE SIZE 27/29MM (N/A Chest) MAZE (N/A ) TRANSESOPHAGEAL ECHOCARDIOGRAM (TEE) (N/A ) CLIPPING OF ATRIAL APPENDAGE USING ATRICURE  PRO2 CLIP SIZE 45MM (Chest)     Patient location during evaluation: SICU Anesthesia Type: General Level of consciousness: sedated Pain management: pain level controlled Vital Signs Assessment: post-procedure vital signs reviewed and stable Respiratory status: patient remains intubated per anesthesia plan Cardiovascular status: stable Postop Assessment: no apparent nausea or vomiting Anesthetic complications: no   No complications documented.  Last Vitals:  Vitals:   04/29/20 1515 04/29/20 1530  BP:    Pulse: 86 80  Resp: 12 12  Temp: (!) 35.9 C (!) 35.9 C  SpO2: 100% 100%    Last Pain:  Vitals:   04/29/20 1400  TempSrc: Axillary  PainSc:                  March Rummage Niajah Sipos

## 2020-04-29 NOTE — Progress Notes (Signed)
ETT was pulled back from 24 to 22 at the lip without complications. Rn at bedside for assistance.

## 2020-04-29 NOTE — Brief Op Note (Signed)
04/29/2020  12:59 PM  PATIENT:  Leslie [name]  55 y.o. female  PRE-OPERATIVE DIAGNOSIS:  MITRAL STENOSIS AFIB  POST-OPERATIVE DIAGNOSIS:  MITRAL STENOSIS AFIB  PROCEDURE:  Procedure(s):  MITRAL VALVE (MV) REPLACEMENT -ON-X VALVE SIZE 27/29MM. Mechanical  MAZE  -Complete Bi-Atrial Lesion Set with Cryothermy and Radiofrequency Ablation --Clipping of LA Appendage with a Pro 2 Clip Size 45mm  TRANSESOPHAGEAL ECHOCARDIOGRAM (TEE) (N/A)   SURGEON:  Surgeon(s) and Role:    Rexene Alberts, MD - Primary  PHYSICIAN ASSISTANT: Ellwood Handler PA-C  ANESTHESIA:   general  EBL:  650 mL   BLOOD ADMINISTERED: CELLSAVER  DRAINS: Mediastinal Chest Drains   LOCAL MEDICATIONS USED:  NONE  SPECIMEN:  Source of Specimen:  Mitral Valve  DISPOSITION OF SPECIMEN:  PATHOLOGY  COUNTS:  YES  TOURNIQUET:  * No tourniquets in log *  DICTATION: .Dragon Dictation  PLAN OF CARE: Admit to inpatient   PATIENT DISPOSITION:  ICU - intubated and hemodynamically stable.   Delay start of Pharmacological VTE agent (>24hrs) due to surgical blood loss or risk of bleeding: yes

## 2020-04-29 NOTE — Hospital Course (Addendum)
History of Present Illness:  Patient is 55 year old morbidly obese African-American female with history of mitral stenosis, chronic diastolic congestive heart failure, persistent atrial fibrillation currently maintaining sinus rhythm on long-term warfarin anticoagulation, hypertension, hyperlipidemia, GE reflux disease, kidney stones, and possible interstitial lung disease who has been referred for surgical consultation to discuss treatment options for management of severe symptomatic mitral stenosis.   Patient reports being told that she had a heart murmur many years ago.  She denies any known history of rheumatic fever or rheumatic heart disease.  She reports a long history of intermittent tachypalpitations and exertional shortness of breath dating back several years.  In August 2019 she was hospitalized with atrial fibrillation with rapid ventricular response and acute exacerbation of likely chronic diastolic congestive heart failure.  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  She was started on warfarin anticoagulation and has been followed intermittently ever since by Dr. Rockey Situ.  She has had episodes of intermittent tachypalpitations associated with worsening shortness of breath that have increased in frequency and severity in recent months.  She has developed progressive symptoms of exertional shortness of breath and fluid retention requiring escalating doses of diuretic therapy.  Recent follow-up transthoracic echocardiogram performed June 27, 2019 revealed findings consistent with further progression in the severity of mitral stenosis with mean transvalvular gradient estimated 10 mmHg at a heart rate of 66 bpm.  Gross appearance of the mitral valve appeared consistent with rheumatic disease with mitral valve area by planimetry  measured only 1.05 cm.  Left ventricular systolic function remained normal with ejection fraction estimated 65 to 70%.  The patient subsequently underwent left and right heart catheterization September 06, 2019.  Catheterization revealed normal coronary artery anatomy with no significant coronary artery disease.  There was moderate pulmonary hypertension with PA pressures measured 52/26 and pulmonary capillary wedge pressure ranging between 26 and 31 mmHg.  Cardiothoracic surgical consultation was requested.   She was evaluated by Dr. Leafy Kindle at which time she admitted that over the last 6 months she has gotten to the point where she cannot do much of anything without getting short of breath.  She cannot go up a flight of stairs without having to stop and take a break.  She has had some lower extremity edema requiring increasing doses of oral diuretic.  She recently had a prolonged episode of tachypalpitations associated with resting shortness of breath and severe orthopnea.  Symptoms lasted for several hours prompting her to take extra doses of metoprolol.  Symptoms eventually subsided when her pulse returned to normal.  Intermittent palpitations have increased in frequency in recent months, but the patient also notes that she gets short of breath even when her heart rate is slow and regular.  She subsequently underwent transesophageal echocardiogram on April 01, 2020 which confirmed the presence of rheumatic heart disease with severe mitral stenosis.   It was felt the patient would benefit from Mitral Valve Replacement and MAZE procedure.  The risks and benefits of the procedure were explained to the patient and she is agreeable to proceed.     Hospital Course:  Mrs. Lack presented to [facility].  She was taken to the operating room and underwent Median Sternotomy, Mitral Valve Replacement, and Complete MAZE procedure.  She tolerated the procedure without difficulty and was taken to the SICU in stable  condition.  During her stay in the SICU the patient was weaned and extubated on POD #1.  She was weaned off Milrinone as hemodynamics allowed.  Her chest tubes and arterial lines were removed without difficulty.  She is not a diabetic and was transitioned off insulin drip to sliding scale coverage.  She was initiated on coumadin therapy for her mechanical mitral valve.  She was on this prior to admission and required a very low daily dose of 1.25 mg.  She developed hematuria, which was monitored.  Unfortunately, she became oliguric and her creatinine level became elevated with level peaking at ***.  She was started on Dopamine for this and her Milrinone dose was increased.  CXR showed evidence of CHF and atelectasis.  Her lasix dose was increased.  She was placed on BIPAP due to hypoxia. Critical Care, Advanced Heart Failure, and Nephrology consults were obtained.  Her chest tubes were removed without difficulty on POD #2.  She was started on a lasix gtt and her urinary output increased.  She was also given some doses of Metolazone to aide with diuresis.    The patient has continued to make very steady progress over the next several days.  Renal function has shown a steady improvement and advanced heart failure as well as nephrology have made adjustments of diuretics throughout the hospital course.  She has had some sinus tachycardia and beta-blocker has been reinitiated.  INR has been monitored daily and more Coumadin dosing has been adjusted.  She did develop a symptomatic for anemia and was subsequently transfused with improvement in her overall feeling of wellbeing.  She had had some weakness and dizziness which is subsequently resolved.  Milrinone continues to be slowly reminded.  Cooximetry is showing a steady improvement.

## 2020-04-29 NOTE — Anesthesia Procedure Notes (Signed)
Arterial Line Insertion Start/End4/12/2020 7:00 AM Performed by: Wilburn Cornelia, CRNA, CRNA  Patient location: Pre-op. Preanesthetic checklist: patient identified, IV checked, site marked, risks and benefits discussed, surgical consent, monitors and equipment checked, pre-op evaluation, timeout performed and anesthesia consent Lidocaine 1% used for infiltration Left, radial was placed Catheter size: 20 Fr Hand hygiene performed  and maximum sterile barriers used   Attempts: 1 Procedure performed without using ultrasound guided technique. Following insertion, dressing applied and Biopatch. Post procedure assessment: normal and unchanged  Patient tolerated the procedure well with no immediate complications.

## 2020-04-29 NOTE — Interval H&P Note (Signed)
History and Physical Interval Note:  04/29/2020 6:05 AM  Leslie [name]  has presented today for surgery, with the diagnosis of MITRAL STENOSIS AFIB.  The various methods of treatment have been discussed with the patient and family. After consideration of risks, benefits and other options for treatment, the patient has consented to  Procedure(s): MITRAL VALVE (MV) REPLACEMENT (N/A) MAZE (N/A) TRANSESOPHAGEAL ECHOCARDIOGRAM (TEE) (N/A) as a surgical intervention.  The patient's history has been reviewed, patient examined, no change in status, stable for surgery.  I have reviewed the patient's chart and labs.  Questions were answered to the patient's satisfaction.     Rexene Alberts

## 2020-04-29 NOTE — Progress Notes (Signed)
Rapid wean initiated per protocol  

## 2020-04-29 NOTE — Anesthesia Procedure Notes (Signed)
Central Venous Catheter Insertion Performed by: Darral Dash, DO, anesthesiologist Start/End4/12/2020 7:06 AM, 04/29/2020 7:07 AM Patient location: Pre-op. Preanesthetic checklist: patient identified, IV checked, site marked, risks and benefits discussed, surgical consent, monitors and equipment checked, pre-op evaluation, timeout performed and anesthesia consent Hand hygiene performed  and maximum sterile barriers used  PA cath was placed.Swan type:thermodilution PA Cath depth:42 Procedure performed without using ultrasound guided technique. Attempts: 1 Patient tolerated the procedure well with no immediate complications.

## 2020-04-29 NOTE — Anesthesia Procedure Notes (Signed)
Central Venous Catheter Insertion Performed by: Darral Dash, DO, anesthesiologist Start/End4/12/2020 6:55 AM, 04/29/2020 7:05 AM Patient location: Pre-op. Preanesthetic checklist: patient identified, IV checked, site marked, risks and benefits discussed, surgical consent, monitors and equipment checked, pre-op evaluation, timeout performed and anesthesia consent Lidocaine 1% used for infiltration and patient sedated Hand hygiene performed  and maximum sterile barriers used  Catheter size: 8.5 Fr Sheath introducer Procedure performed using ultrasound guided technique. Ultrasound Notes:anatomy identified, needle tip was noted to be adjacent to the nerve/plexus identified, no ultrasound evidence of intravascular and/or intraneural injection and image(s) printed for medical record Attempts: 1 Following insertion, line sutured, dressing applied and Biopatch. Post procedure assessment: blood return through all ports, free fluid flow and no air  Patient tolerated the procedure well with no immediate complications.

## 2020-04-29 NOTE — Op Note (Signed)
CARDIOTHORACIC SURGERY OPERATIVE NOTE  Date of Procedure:  04/29/2020  Preoperative Diagnosis:   Rheumatic Heart Disease  Severe Mitral Stenosis  Recurrent Paroxysmal Atrial Fibrillation  Postoperative Diagnosis: Same  Procedure:   Mitral Valve Replacement   Onyx Bileaflet Mechanical Valve (size 27/51mm, REF #[id number], serial #[id number])   Maze Procedure   Complete bilateral atrial lesion set using bipolar radiofrequency and cryothermy ablation  Clipping of left atrial appendage (Atricure Pro245 left atrial clip, size 48mm)    Surgeon: Valentina Gu. Roxy Manns, MD  Assistant: Ellwood Handler, PA-C  Anesthesia: Donney Dice, DO  Operative Findings:  Rheumatic mitral valve disease with severe mitral stenosis  Normal left ventricular systolic function               BRIEF CLINICAL NOTE AND INDICATIONS FOR SURGERY  Patient is 55 year old morbidly obese African-American female with history of mitral stenosis, chronic diastolic congestive heart failure, persistent atrial fibrillation currently maintaining sinus rhythm on long-term warfarin anticoagulation, hypertension, hyperlipidemia, GE reflux disease,kidney stones,and possible interstitial lung disease who has been referred for surgical consultation to discuss treatment options for management of severe symptomatic mitral stenosis.  Patient reports being told that she had a heart murmur many years ago. She denies any known history of rheumatic fever or rheumatic heart disease. She reports a long history of intermittent tachypalpitations and exertional shortness of breath dating back several years.In August 2019 she was hospitalized with atrial fibrillation with rapid ventricular response and acute exacerbation of likely chronic diastolic congestive heart failure. She spontaneously converted back to sinus rhythm during her hospitalization during which time she underwent TEE that confirmed the presence of likely rheumatic  mitral valve disease with at least moderate mitral stenosis. She was started on warfarin anticoagulation and has been followed intermittently ever since by Dr. Rockey Situ. She has had episodes of intermittent tachypalpitations associated with worsening shortness of breath that have increased in frequency and severity in recent months. She has developed progressive symptoms of exertional shortness of breath and fluid retention requiring escalating doses of diuretic therapy. Recent follow-up transthoracic echocardiogram performed June 27, 2019 revealed findings consistent with further progression in the severity of mitral stenosis with mean transvalvular gradient estimated 10 mmHg at a heart rate of 66 bpm. Gross appearance of the mitral valve appeared consistent with rheumatic disease with mitral valve area by planimetry measured only 1.05 cm. Left ventricular systolic function remainednormal with ejection fraction estimated 65 to 70%. The patient subsequently underwent left and right heart catheterization September 06, 2019. Catheterization revealed normal coronary artery anatomy with no significant coronary artery disease. There was moderate pulmonary hypertension with PA pressures measured 52/26 and pulmonary capillary wedge pressure ranging between 26 and 31 mmHg. Cardiothoracic surgical consultation was requested.  The patient has been seen in consultation and counseled at length regarding the indications, risks and potential benefits of surgery.  All questions have been answered, and the patient provides full informed consent for the operation as described.    DETAILS OF THE OPERATIVE PROCEDURE  Preparation:  The patient is brought to the operating room on the above mentioned date and central monitoring was established by the anesthesia team including placement of Swan-Ganz catheter and radial arterial line.  There was moderate to severe pulmonary hypertension at baseline.  The patient is placed  in the supine position on the operating table.  Intravenous antibiotics are administered. General endotracheal anesthesia is induced uneventfully. A Foley catheter is placed.  Baseline transesophageal echocardiogram was performed.  Findings were notable for classical  rheumatic mitral stenosis.  There was severe thickening with some calcification involving both leaflets of the mitral valve.  All of the subvalvular apparatus was thickened and foreshortened.  There was significant calcification.  There was mild mitral regurgitation.  There was normal left ventricular function.  There was normal right ventricular size and function.  There was trace to mild tricuspid regurgitation.  The patient's chest, abdomen, both groins, and both lower extremities are prepared and draped in a sterile manner. A time out procedure is performed.   Percutaneous Vascular Access:  Percutaneous femoral venous access were obtained on the right side.  Using ultrasound guidance the right common femoral vein was cannulated using the Seldinger technique and a pair of Perclose vascular closure devises were placed at opposing 30 degree angles, after which time an 8 French sheath inserted.     Surgical Approach:  A median sternotomy incision was performed and the pericardium is opened. The ascending aorta is normal in appearance.    Extracorporeal Cardiopulmonary Bypass and Myocardial Protection:  The patient was heparinized systemically.  The ascending aorta is cannulated typically for cardiopulmonary bypass.  The right common femoral vein is cannulated through the venous sheath and a guidewire advanced into the right atrium using TEE guidance.  The femoral vein cannulated using a 22 Fr long femoral venous cannula.  Adequate heparinization is verified.   A retrograde cardioplegia cannula is placed through the right atrium into the coronary sinus.   The entire pre-bypass portion of the operation was notable for stable  hemodynamics.  Cardiopulmonary bypass was begun and the surface of the heart is inspected.  A second venous cannula is placed directly into the superior vena cava.   A cardioplegia cannula is placed in the ascending aorta.  A temperature probe was placed in the interventricular septum.  The patient is cooled to 32C systemic temperature.  The aortic cross clamp is applied and cold blood cardioplegia is delivered initially in an antegrade fashion through the aortic root.   Supplemental cardioplegia is given retrograde through the coronary sinus catheter.  Iced saline slush is applied for topical hypothermia.  The initial cardioplegic arrest is rapid with early diastolic arrest.  Myocardial protection was felt to be excellent.   Maze Procedure (left atrial lesion set):  The AtriCure Synergy bipolar radiofrequency ablation clamp is used for all radiofrequency ablation lesions for the maze procedure.  The Atricure CryoICE nitrous oxide cryothermy system is utilized for all cryothermy ablation lesions.   The heart is retracted towards the surgeon's side and the left sided pulmonary veins exposed.  An elliptical ablation lesion is created around the base of the left sided pulmonary veins.  A similar elliptical lesion was created around the base of the left atrial appendage.  The left atrial appendage was obliterated using an left atrial appendage clip (Atricure Pro245 left atrial clip size 81mm).  The heart was replaced into the pericardial sac.  An elliptical ablation lesion is created around the base of the right sided pulmonary veins.    A left atriotomy incision was performed through the interatrial groove and extended partially across the back wall of the left atrium after opening the oblique sinus inferiorly.  The floor of the left atrium and the mitral valve were exposed using a self-retaining retractor.  The mitral valve was inspected.  A bipolar ablation lesion was placed across the dome of the  left atrium from the cephalad apex of the atriotomy incision to reach the cephalad apex of the  elliptical lesion around the left sided pulmonary veins.  A similar bipolar lesion was placed across the back wall of the left atrium from the caudad apex of the atriotomy incision to reach the caudad apex of the elliptical lesion around the left sided pulmonary veins, thereby completing a box.  Finally another bipolar lesion was placed across the back wall of the left atrium from the caudad apex of the atriotomy incision towards the posterior mitral valve annulus.  This lesion was completed along the endocardial surface onto the posterior mitral annulus with a 3 minute duration cryothermy lesion, followed by a second cryothermy lesion along the posterior epicardial surface of the left atrium across the coronary sinus with the probe extending up the epicardial surface of the lateral wall of the right atrium. This completes the entire left side lesion set of the Cox maze procedure.   Mitral Valve Replacement:  The mitral valve was inspected and notable for classical rheumatic features with severe thickening, calcification, fibrosis, and restricted leaflet mobility involving the entire valve.  All of the subvalvular apparatus was completely foreshortened and fused.  Due to the extensive and complete involvement of the subvalvular apparatus it was not possible to preserve any of the subvalvular apparatus for valve replacement.  The entire mitral valve was excised sharply.  The aortic annulus was sized to accept accept a 27/29 mm prosthesis.  The left ventricle was irrigated with copious cold saline solution.  Mitral valve replacement was performed using interrupted horizontal mattress 2-0 Ethibond pledgeted sutures with pledgets in the supraannular position.  An Onyx bileaflet mechanical valve (size 27/29 mm, REF # [id number], serial # [id number]) was implanted uneventfully. The valve seated appropriately and without  difficulty.  All sutures were secured using a Cor-knot device.  Rewarming is begun.  The atriotomy was closed using a 2-layer closure of running 3-0 Prolene suture after placing a sump drain across the mitral valve to serve as a left ventricular vent.  One final dose of warm retrograde "reanimation dose" cardioplegia was administered retrograde through the coronary sinus catheter while all air was evacuated through the aortic root.  The aortic cross clamp was removed after a total cross clamp time of 100 minutes.   Maze Procedure (right atrial lesion set):  The retrograde cardioplegia cannula was removed and the small hole in the right atrium extended a short distance.  The AtriCure Synergy bipolar radiofrequency ablation clamp is utilized to create a series of linear lesions in the right atrium, each with one limb of the clamp along the endocardial surface and the other along the epicardial surface. The first lesion is placed from the posterior apex of the atriotomy incision and along the lateral wall of the right atrium to reach the lateral aspect of the superior vena cava, connecting with the cryothermy lesion placed previously across the coronary sinus and up the epicardial surface of the lateral right atrium.  A second lesion is placed in the opposite direction from the posterior apex of the atriotomy incision along the lateral wall to reach the lateral aspect of the inferior vena cava. A third lesion is placed from the midportion of the atriotomy incision extending at a right angle to reach the tip of the right atrial appendage. A fourth lesion is placed from the anterior apex of the atriotomy incision in an anterior and inferior direction to reach the acute margin of the heart. Finally, the cryotherapy probe is utilized to complete the right atrial lesion set by placing the probe  along the endocardial surface of the right atrium from the anterior apex of the atriotomy incision to reach the tricuspid  annulus at the 2:00 position. The right atriotomy incision is closed with a 2 layer closure of running 4-0 Prolene suture.   Procedure Completion:  Epicardial pacing wires are fixed to the right ventricular outflow tract and to the right atrial appendage. The patient is rewarmed to 37C temperature. The aortic and left ventricular vents are removed.  The patient is weaned and disconnected from cardiopulmonary bypass.  The patient's rhythm at separation from bypass was AV paced.  The patient was weaned from cardioplegic bypass without any inotropic support. Total cardiopulmonary bypass time for the operation was 128 minutes.  Followup transesophageal echocardiogram performed after separation from bypass revealed a well-seated mitral valve prosthesis that was functioning normally and without any sign of paravalvular leak.  Left ventricular function was unchanged from preoperatively.  The aortic and superior vena cava cannula were removed uneventfully. Protamine was administered to reverse the anticoagulation. The femoral venous cannula was removed and manual pressure held on the groin for 30 minutes.  The mediastinum and pleural space were inspected for hemostasis and irrigated with saline solution. The mediastinum and the left pleural space were drained using 3 chest tubes placed through separate stab incisions inferiorly.  The soft tissues anterior to the aorta were reapproximated loosely. The sternum is closed with double strength sternal wire. The soft tissues anterior to the sternum were closed in multiple layers and the skin is closed with a running subcuticular skin closure.  The post-bypass portion of the operation was notable for stable rhythm and hemodynamics.  The patient received 2 units packed red blood cells during the procedure due to anemia which was present preoperatively and exacerbated by acute blood loss and hemodilution during cardiopulmonary bypass.     Patient  Disposition:  The patient tolerated the procedure well and is transported to the surgical intensive care in stable condition. There are no intraoperative complications. All sponge instrument and needle counts are verified correct at completion of the operation.     Valentina Gu. Roxy Manns MD 04/29/2020 12:59 PM

## 2020-04-29 NOTE — Telephone Encounter (Signed)
Faxed Short [employer] Disability form from [employer] to fax # [phone].

## 2020-04-30 ENCOUNTER — Encounter (HOSPITAL_COMMUNITY): Payer: Self-pay | Admitting: Thoracic Surgery (Cardiothoracic Vascular Surgery)

## 2020-04-30 ENCOUNTER — Inpatient Hospital Stay (HOSPITAL_COMMUNITY): Payer: 59

## 2020-04-30 LAB — BASIC METABOLIC PANEL
Anion gap: 8 (ref 5–15)
Anion gap: 8 (ref 5–15)
BUN: 19 mg/dL (ref 6–20)
BUN: 26 mg/dL — ABNORMAL HIGH (ref 6–20)
CO2: 22 mmol/L (ref 22–32)
CO2: 22 mmol/L (ref 22–32)
Calcium: 7.7 mg/dL — ABNORMAL LOW (ref 8.9–10.3)
Calcium: 8.1 mg/dL — ABNORMAL LOW (ref 8.9–10.3)
Chloride: 110 mmol/L (ref 98–111)
Chloride: 104 mmol/L (ref 98–111)
Creatinine, Ser: 1.75 mg/dL — ABNORMAL HIGH (ref 0.44–1.00)
Creatinine, Ser: 2.66 mg/dL — ABNORMAL HIGH (ref 0.44–1.00)
GFR, Estimated: 34 mL/min — ABNORMAL LOW (ref >60)
GFR, Estimated: 21 mL/min — ABNORMAL LOW (ref >60)
Glucose, Bld: 146 mg/dL — ABNORMAL HIGH (ref 70–99)
Glucose, Bld: 193 mg/dL — ABNORMAL HIGH (ref 70–99)
Potassium: 3.7 mmol/L (ref 3.5–5.1)
Potassium: 3.9 mmol/L (ref 3.5–5.1)
Sodium: 140 mmol/L (ref 135–145)
Sodium: 134 mmol/L — ABNORMAL LOW (ref 135–145)

## 2020-04-30 LAB — CBC
HCT: 26.5 % — ABNORMAL LOW (ref 36.0–46.0)
HCT: 25.8 % — ABNORMAL LOW (ref 36.0–46.0)
Hemoglobin: 8.7 g/dL — ABNORMAL LOW (ref 12.0–15.0)
Hemoglobin: 8.4 g/dL — ABNORMAL LOW (ref 12.0–15.0)
MCH: 29.4 pg (ref 26.0–34.0)
MCH: 29.8 pg (ref 26.0–34.0)
MCHC: 32.8 g/dL (ref 30.0–36.0)
MCHC: 32.6 g/dL (ref 30.0–36.0)
MCV: 89.5 fL (ref 80.0–100.0)
MCV: 91.5 fL (ref 80.0–100.0)
Platelets: 164 10*3/uL (ref 150–400)
Platelets: 151 10*3/uL (ref 150–400)
RBC: 2.96 MIL/uL — ABNORMAL LOW (ref 3.87–5.11)
RBC: 2.82 MIL/uL — ABNORMAL LOW (ref 3.87–5.11)
RDW: 15.2 % (ref 11.5–15.5)
RDW: 15.2 % (ref 11.5–15.5)
WBC: 16.2 10*3/uL — ABNORMAL HIGH (ref 4.0–10.5)
WBC: 19.1 10*3/uL — ABNORMAL HIGH (ref 4.0–10.5)
nRBC: 0 % (ref 0.0–0.2)
nRBC: 0.2 % (ref 0.0–0.2)

## 2020-04-30 LAB — POCT I-STAT 7, (LYTES, BLD GAS, ICA,H+H)
Acid-base deficit: 3 mmol/L — ABNORMAL HIGH (ref 0.0–2.0)
Acid-base deficit: 4 mmol/L — ABNORMAL HIGH (ref 0.0–2.0)
Acid-base deficit: 7 mmol/L — ABNORMAL HIGH (ref 0.0–2.0)
Acid-base deficit: 3 mmol/L — ABNORMAL HIGH (ref 0.0–2.0)
Bicarbonate: 22.3 mmol/L (ref 20.0–28.0)
Bicarbonate: 21.5 mmol/L (ref 20.0–28.0)
Bicarbonate: 19.1 mmol/L — ABNORMAL LOW (ref 20.0–28.0)
Bicarbonate: 22.9 mmol/L (ref 20.0–28.0)
Calcium, Ion: 1.08 mmol/L — ABNORMAL LOW (ref 1.15–1.40)
Calcium, Ion: 1.08 mmol/L — ABNORMAL LOW (ref 1.15–1.40)
Calcium, Ion: 1.14 mmol/L — ABNORMAL LOW (ref 1.15–1.40)
Calcium, Ion: 1.1 mmol/L — ABNORMAL LOW (ref 1.15–1.40)
HCT: 27 % — ABNORMAL LOW (ref 36.0–46.0)
HCT: 28 % — ABNORMAL LOW (ref 36.0–46.0)
HCT: 23 % — ABNORMAL LOW (ref 36.0–46.0)
HCT: 23 % — ABNORMAL LOW (ref 36.0–46.0)
Hemoglobin: 9.2 g/dL — ABNORMAL LOW (ref 12.0–15.0)
Hemoglobin: 9.5 g/dL — ABNORMAL LOW (ref 12.0–15.0)
Hemoglobin: 7.8 g/dL — ABNORMAL LOW (ref 12.0–15.0)
Hemoglobin: 7.8 g/dL — ABNORMAL LOW (ref 12.0–15.0)
O2 Saturation: 92 %
O2 Saturation: 90 %
O2 Saturation: 83 %
O2 Saturation: 96 %
Patient temperature: 97.8
Patient temperature: 97.8
Potassium: 3.6 mmol/L (ref 3.5–5.1)
Potassium: 3.8 mmol/L (ref 3.5–5.1)
Potassium: 3.8 mmol/L (ref 3.5–5.1)
Potassium: 3.8 mmol/L (ref 3.5–5.1)
Sodium: 143 mmol/L (ref 135–145)
Sodium: 142 mmol/L (ref 135–145)
Sodium: 134 mmol/L — ABNORMAL LOW (ref 135–145)
Sodium: 136 mmol/L (ref 135–145)
TCO2: 23 mmol/L (ref 22–32)
TCO2: 23 mmol/L (ref 22–32)
TCO2: 20 mmol/L — ABNORMAL LOW (ref 22–32)
TCO2: 24 mmol/L (ref 22–32)
pCO2 arterial: 40.1 mmHg (ref 32.0–48.0)
pCO2 arterial: 40.7 mmHg (ref 32.0–48.0)
pCO2 arterial: 39.5 mmHg (ref 32.0–48.0)
pCO2 arterial: 42.2 mmHg (ref 32.0–48.0)
pH, Arterial: 7.353 (ref 7.350–7.450)
pH, Arterial: 7.33 — ABNORMAL LOW (ref 7.350–7.450)
pH, Arterial: 7.291 — ABNORMAL LOW (ref 7.350–7.450)
pH, Arterial: 7.341 — ABNORMAL LOW (ref 7.350–7.450)
pO2, Arterial: 68 mmHg — ABNORMAL LOW (ref 83.0–108.0)
pO2, Arterial: 62 mmHg — ABNORMAL LOW (ref 83.0–108.0)
pO2, Arterial: 52 mmHg — ABNORMAL LOW (ref 83.0–108.0)
pO2, Arterial: 88 mmHg (ref 83.0–108.0)

## 2020-04-30 LAB — COOXEMETRY PANEL
Carboxyhemoglobin: 0.5 % (ref 0.5–1.5)
Methemoglobin: 1 % (ref 0.0–1.5)
O2 Saturation: 43.3 %
Total hemoglobin: 8.1 g/dL — ABNORMAL LOW (ref 12.0–16.0)

## 2020-04-30 LAB — SURGICAL PATHOLOGY

## 2020-04-30 LAB — BLOOD GAS, VENOUS
Acid-base deficit: 6.6 mmol/L — ABNORMAL HIGH (ref 0.0–2.0)
Bicarbonate: 19.1 mmol/L — ABNORMAL LOW (ref 20.0–28.0)
Drawn by: 1444
FIO2: 21
O2 Saturation: 67.7 %
Patient temperature: 37
pCO2, Ven: 43.8 mmHg — ABNORMAL LOW (ref 44.0–60.0)
pH, Ven: 7.263 (ref 7.250–7.430)
pO2, Ven: 42.9 mmHg (ref 32.0–45.0)

## 2020-04-30 LAB — GLUCOSE, CAPILLARY
Glucose-Capillary: 164 mg/dL — ABNORMAL HIGH (ref 70–99)
Glucose-Capillary: 154 mg/dL — ABNORMAL HIGH (ref 70–99)
Glucose-Capillary: 147 mg/dL — ABNORMAL HIGH (ref 70–99)
Glucose-Capillary: 149 mg/dL — ABNORMAL HIGH (ref 70–99)
Glucose-Capillary: 170 mg/dL — ABNORMAL HIGH (ref 70–99)
Glucose-Capillary: 141 mg/dL — ABNORMAL HIGH (ref 70–99)
Glucose-Capillary: 156 mg/dL — ABNORMAL HIGH (ref 70–99)
Glucose-Capillary: 161 mg/dL — ABNORMAL HIGH (ref 70–99)
Glucose-Capillary: 140 mg/dL — ABNORMAL HIGH (ref 70–99)
Glucose-Capillary: 124 mg/dL — ABNORMAL HIGH (ref 70–99)
Glucose-Capillary: 182 mg/dL — ABNORMAL HIGH (ref 70–99)
Glucose-Capillary: 188 mg/dL — ABNORMAL HIGH (ref 70–99)

## 2020-04-30 LAB — MAGNESIUM
Magnesium: 3.2 mg/dL — ABNORMAL HIGH (ref 1.7–2.4)
Magnesium: 2.9 mg/dL — ABNORMAL HIGH (ref 1.7–2.4)

## 2020-04-30 LAB — PREPARE RBC (CROSSMATCH)

## 2020-04-30 MED ORDER — FUROSEMIDE 10 MG/ML IJ SOLN
30.0000 mg/h | INTRAVENOUS | Status: DC
  Administered 2020-04-30: 4 mg/h via INTRAVENOUS
  Administered 2020-05-01: 15 mg/h via INTRAVENOUS
  Administered 2020-05-01 – 2020-05-04 (×9): 30 mg/h via INTRAVENOUS
  Filled 2020-04-30 (×14): qty 20

## 2020-04-30 MED ORDER — ENOXAPARIN SODIUM 30 MG/0.3ML ~~LOC~~ SOLN: 30.0000 mg | Freq: Every day | SUBCUTANEOUS | Status: DC

## 2020-04-30 MED ORDER — SODIUM CHLORIDE 0.9 % IV SOLN: INTRAVENOUS | Status: DC | PRN

## 2020-04-30 MED ORDER — SODIUM CHLORIDE 0.9% IV SOLUTION: Freq: Once | INTRAVENOUS | Status: AC

## 2020-04-30 MED ORDER — SODIUM CHLORIDE 0.9% FLUSH
10.0000 mL | Freq: Two times a day (BID) | INTRAVENOUS | Status: DC
  Administered 2020-04-30 – 2020-05-04 (×8): 10 mL

## 2020-04-30 MED ORDER — SODIUM BICARBONATE 8.4 % IV SOLN
INTRAVENOUS | Status: AC
  Administered 2020-04-30: 100 meq via INTRAVENOUS
  Filled 2020-04-30: qty 100

## 2020-04-30 MED ORDER — WARFARIN SODIUM 2.5 MG PO TABS
2.5000 mg | ORAL_TABLET | Freq: Every day | ORAL | Status: DC
  Administered 2020-04-30: 2.5 mg via ORAL
  Filled 2020-04-30: qty 1

## 2020-04-30 MED ORDER — WARFARIN - PHYSICIAN DOSING INPATIENT: Freq: Every day | Status: DC

## 2020-04-30 MED ORDER — ORAL CARE MOUTH RINSE
15.0000 mL | Freq: Two times a day (BID) | OROMUCOSAL | Status: DC
  Administered 2020-04-30 – 2020-05-09 (×12): 15 mL via OROMUCOSAL

## 2020-04-30 MED ORDER — DOPAMINE-DEXTROSE 3.2-5 MG/ML-% IV SOLN
0.0000 ug/kg/min | INTRAVENOUS | Status: DC
  Administered 2020-04-30 – 2020-05-04 (×3): 3 ug/kg/min via INTRAVENOUS
  Administered 2020-05-05: 2 ug/kg/min via INTRAVENOUS
  Filled 2020-04-30 (×4): qty 250

## 2020-04-30 MED ORDER — POTASSIUM CHLORIDE 10 MEQ/50ML IV SOLN
10.0000 meq | INTRAVENOUS | Status: AC
  Administered 2020-04-30 (×3): 10 meq via INTRAVENOUS
  Filled 2020-04-30 (×3): qty 50

## 2020-04-30 MED ORDER — INSULIN ASPART 100 UNIT/ML ~~LOC~~ SOLN
0.0000 [IU] | SUBCUTANEOUS | Status: DC
  Administered 2020-04-30: 2 [IU] via SUBCUTANEOUS
  Administered 2020-04-30 (×2): 4 [IU] via SUBCUTANEOUS
  Administered 2020-05-01 (×2): 2 [IU] via SUBCUTANEOUS
  Administered 2020-05-01: 4 [IU] via SUBCUTANEOUS
  Administered 2020-05-01: 2 [IU] via SUBCUTANEOUS
  Administered 2020-05-01: 4 [IU] via SUBCUTANEOUS
  Administered 2020-05-01 – 2020-05-04 (×14): 2 [IU] via SUBCUTANEOUS

## 2020-04-30 MED ORDER — ATORVASTATIN CALCIUM 80 MG PO TABS: 80.0000 mg | ORAL_TABLET | Freq: Every evening | ORAL | Status: DC

## 2020-04-30 MED ORDER — FE FUMARATE-B12-VIT C-FA-IFC PO CAPS: 1.0000 | ORAL_CAPSULE | Freq: Every day | ORAL | Status: DC

## 2020-04-30 MED ORDER — SODIUM CHLORIDE 0.9% FLUSH
10.0000 mL | INTRAVENOUS | Status: DC | PRN
Start: 2020-04-30 — End: 2020-05-04

## 2020-04-30 MED ORDER — SODIUM BICARBONATE 8.4 % IV SOLN: 100.0000 meq | Freq: Once | INTRAVENOUS | Status: AC

## 2020-04-30 NOTE — Procedures (Signed)
Extubation Procedure Note  Patient Details:   Name: Leslie [name] DOB: [id number] MRN: [id number]   Airway Documentation:    Vent end date: 04/30/20 Vent end time: 0445   Evaluation  O2 sats: stable throughout Complications: No apparent complications Patient did tolerate procedure well. Bilateral Breath Sounds: Clear,Diminished   Yes   Pt extubated to 5L [place] per protocol.  NIF: -30 VC: 564ml  IS: 57ml.  Positive cuff leak.  No stridor noted.    Clance Boll 04/30/2020, 4:51 AM

## 2020-04-30 NOTE — Discharge Instructions (Addendum)
Mitral Valve Replacement, Care After This sheet gives you information about how to care for yourself after your procedure. Your health care provider may also give you more specific instructions. If you have problems or questions, contact your health care provider. What can I expect after the procedure? After the procedure, it is common to have pain at the incision area. This may last for several weeks. Follow these instructions at home: Incision care  Follow instructions from your health care provider about how to take care of your incision. Make sure you: ? Wash your hands with soap and water before and after you change your bandage (dressing). If soap and water are not available, use hand sanitizer. ? Change your dressing as told by your health care provider. ? Leave stitches (sutures), skin glue, or adhesive strips in place. These skin closures may need to stay in place for 2 weeks or longer. If adhesive strip edges start to loosen and curl up, you may trim the loose edges. Do not remove adhesive strips completely unless your health care provider tells you to do that.  Check your incision area every day for signs of infection. Check for: ? Redness, swelling, or pain. ? Fluid or blood. ? Warmth. ? Pus or a bad smell.  Do not apply powder or lotion to the area.   Bathing  Do not take baths, swim, or use a hot tub until your health care provider approves. You may shower  To wash the incision site, gently wash with soap and water and pat the area dry with a clean towel. Do not rub the incision area. That may cause bleeding. Activity  Rest as told by your health care provider.  Avoid sitting for a long time without moving, and avoid crossing your legs. Get up to take short walks every 1-2 hours. This is important to improve blood flow and breathing. Ask for help if you feel weak or unsteady.  Return to your normal activities as told by your health care provider. Ask your health care  provider what activities are safe for you.  Avoid the following activities for 6-8 weeks, or as long as directed: ? Lifting anything that is heavier than 10 lb (4.5 kg), or the limit that you are told. ? Pushing or pulling things with your arms.  Avoid climbing stairs and using the handrail to pull yourself up for the first 2-3 weeks after surgery.  Avoid airplane travel for 4-6 weeks, or as long as directed.  If you are taking blood thinners (anticoagulants), avoid activities that have a high risk of injury. Ask your health care provider what activities are safe for you. Medicines  Take over-the-counter and prescription medicines only as told by your health care provider.  If you are taking blood thinners: ? Talk with your health care provider before you take any medicines that contain aspirin or NSAIDs. These medicines increase your risk for dangerous bleeding. ? Take your medicine exactly as told, at the same time every day. ? Avoid activities that could cause injury or bruising. ? Follow instructions about how to prevent falls. ? Wear a medical alert bracelet or carry a card that lists what medicines you take.  Ask your health care provider if the medicine prescribed to you: ? Requires you to avoid driving or using heavy machinery. ? Can cause constipation. You may need to take actions to prevent or treat constipation, such as:  Drink enough fluid to keep your urine pale yellow.  Take over-the-counter  or prescription medicines.  Eat foods that are high in fiber, such as beans, whole grains, and fresh fruits and vegetables.  Limit foods that are high in fat and processed sugars, such as fried or sweet foods. General instructions  Take your temperature every day and weigh yourself every morning for the first 7 days after surgery. Write your temperatures and weight down and take this record with you to any follow-up visits.  Wear compression stockings as told by your health care  provider. These stockings may help to prevent blood clots and reduce swelling in your legs. You may be asked to wear these stockings for at least 2 weeks after your procedure. If your ankles are swollen after 2 weeks, contact your health care provider to see if you should continue to wear the stockings.  Follow instructions from your health care provider about eating or drinking restrictions.  Do not drink alcohol until your health care provider approves.  Do not drive until your health care provider approves.  Do not use any products that contain nicotine or tobacco, such as cigarettes, e-cigarettes, and chewing tobacco. If you need help quitting, ask your health care provider.  Keep all follow-up visits as told by your health care provider. This is important.   Contact a health care provider if:  You develop a skin rash.  Your weight is increasing each day over 2-3 days.  You gain 2 lb (1 kg) or more in a single day. Get help right away if:  You develop chest pain that feels different from the pain caused by your incision.  You develop shortness of breath or difficulty breathing.  You have a fever.  You have redness, swelling, or pain around your incision.  You have fluid or blood coming from your incision.  Your incision feels warm to the touch.  You have pus or a bad smell coming from your incision.  You feel light-headed. Summary  After the procedure, it is common to have pain at the incision area. This may last for several weeks.  Take your temperature every day and weigh yourself every morning for the first 7 days after surgery.  Check your incision area every day for signs of infection.  Keep all follow-up visits as told by your health care provider. This is important. This information is not intended to replace advice given to you by your health care provider. Make sure you discuss any questions you have with your health care provider. Document Revised:  09/27/2017 Document Reviewed: 09/27/2017 Elsevier Patient Education  2021 [place]. Discharge Instructions:  1. You may shower, please wash incisions daily with soap and water and keep dry.  If you wish to cover wounds with dressing you may do so but please keep clean and change daily.  No tub baths or swimming until incisions have completely healed.  If your incisions become red or develop any drainage please call our office at [phone]  2. No Driving until cleared by Dr. Guy Sandifer office and you are no longer using narcotic pain medications  3. Monitor your weight daily.. Please use the same scale and weigh at same time... If you gain 5-10 lbs in 48 hours with associated lower extremity swelling, please contact our office at [phone]  4. Fever of 101.5 for at least 24 hours with no source, please contact our office at [phone]  5. Activity- up as tolerated, please walk at least 3 times per day.  Avoid strenuous activity, no lifting, pushing, or pulling  with your arms over 8-10 lbs for a minimum of 6 weeks  6. If any questions or concerns arise, please do not hesitate to contact our office at [phone]   Information on my medicine - Coumadin   (Warfarin)  This medication education was reviewed with me or my healthcare representative as part of my discharge preparation.  The pharmacist that spoke with me during my hospital stay was:  Georgina Peer, [facility]  Why was Coumadin prescribed for you? Coumadin was prescribed for you because you have a blood clot or a medical condition that can cause an increased risk of forming blood clots. Blood clots can cause serious health problems by blocking the flow of blood to the heart, lung, or brain. Coumadin can prevent harmful blood clots from forming. As a reminder your indication for Coumadin is:   Blood Clot Prevention After Heart Valve Surgeryand pre-existing afib What test will check on my response to Coumadin? While on Coumadin  (warfarin) you will need to have an INR test regularly to ensure that your dose is keeping you in the desired range. The INR (international normalized ratio) number is calculated from the result of the laboratory test called prothrombin time (PT).  If an INR APPOINTMENT HAS NOT ALREADY BEEN MADE FOR YOU please schedule an appointment to have this lab work done by your health care provider within 7 days. Your INR goal is usually a number between:  2 to 3 or your provider may give you a higher range like 3-3.5.  Ask your health care provider during an office visit what your goal INR is.  What  do you need to  know  About  COUMADIN? Take Coumadin (warfarin) exactly as prescribed by your healthcare provider about the same time each day.  DO NOT stop taking without talking to the doctor who prescribed the medication.  Stopping without other blood clot prevention medication to take the place of Coumadin may increase your risk of developing a new clot or stroke.  Get refills before you run out.  What do you do if you miss a dose? If you miss a dose, take it as soon as you remember on the same day then continue your regularly scheduled regimen the next day.  Do not take two doses of Coumadin at the same time.  Important Safety Information A possible side effect of Coumadin (Warfarin) is an increased risk of bleeding. You should call your healthcare provider right away if you experience any of the following: ? Bleeding from an injury or your nose that does not stop. ? Unusual colored urine (red or dark brown) or unusual colored stools (red or black). ? Unusual bruising for unknown reasons. ? A serious fall or if you hit your head (even if there is no bleeding).  Some foods or medicines interact with Coumadin (warfarin) and might alter your response to warfarin. To help avoid this: ? Eat a balanced diet, maintaining a consistent amount of Vitamin K. ? Notify your provider about major diet changes you plan  to make. ? Avoid alcohol or limit your intake to 1 drink for women and 2 drinks for men per day. (1 drink is 5 oz. wine, 12 oz. beer, or 1.5 oz. liquor.)  Make sure that ANY health care provider who prescribes medication for you knows that you are taking Coumadin (warfarin).  Also make sure the healthcare provider who is monitoring your Coumadin knows when you have started a new medication including herbals and non-prescription  products.  Coumadin (Warfarin)  Major Drug Interactions  Increased Warfarin Effect Decreased Warfarin Effect  Alcohol (large quantities) Antibiotics (esp. Septra/Bactrim, Flagyl, Cipro) Amiodarone (Cordarone) Aspirin (ASA) Cimetidine (Tagamet) Megestrol (Megace) NSAIDs (ibuprofen, naproxen, etc.) Piroxicam (Feldene) Propafenone (Rythmol SR) Propranolol (Inderal) Isoniazid (INH) Posaconazole (Noxafil) Barbiturates (Phenobarbital) Carbamazepine (Tegretol) Chlordiazepoxide (Librium) Cholestyramine (Questran) Griseofulvin Oral Contraceptives Rifampin Sucralfate (Carafate) Vitamin K   Coumadin (Warfarin) Major Herbal Interactions  Increased Warfarin Effect Decreased Warfarin Effect  Garlic Ginseng Ginkgo biloba Coenzyme Q10 Green tea St. John's wort    Coumadin (Warfarin) FOOD Interactions  Eat a consistent number of servings per week of foods HIGH in Vitamin K (1 serving =  cup)  Collards (cooked, or boiled & drained) Kale (cooked, or boiled & drained) Mustard greens (cooked, or boiled & drained) Parsley *serving size only =  cup Spinach (cooked, or boiled & drained) Swiss chard (cooked, or boiled & drained) Turnip greens (cooked, or boiled & drained)  Eat a consistent number of servings per week of foods MEDIUM-HIGH in Vitamin K (1 serving = 1 cup)  Asparagus (cooked, or boiled & drained) Broccoli (cooked, boiled & drained, or raw & chopped) Brussel sprouts (cooked, or boiled & drained) *serving size only =  cup Lettuce, raw (green leaf,  endive, romaine) Spinach, raw Turnip greens, raw & chopped   These websites have more information on Coumadin (warfarin):  FailFactory.se; VeganReport.com.au;

## 2020-04-30 NOTE — Procedures (Signed)
Arterial Catheter Insertion Procedure Note  Leslie [name]  [id number]  05-31-1965  Date:04/30/20  Time:9:23 PM    Provider Performing: Graciella Freer    Procedure: Insertion of Arterial Line [phone]) without US guidance  Indication(s) Blood pressure monitoring and/or need for frequent ABGs  Consent Risks of the procedure as well as the alternatives and risks of each were explained to the patient and/or caregiver.  Consent for the procedure was obtained and is signed in the bedside chart  Anesthesia None   Time Out Verified patient identification, verified procedure, site/side was marked, verified correct patient position, special equipment/implants available, medications/allergies/relevant history reviewed, required imaging and test results available.   Sterile Technique Maximal sterile technique including full sterile barrier drape, hand hygiene, sterile gown, sterile gloves, mask, hair covering, sterile ultrasound probe cover (if used).   Procedure Description Area of catheter insertion was cleaned with chlorhexidine and draped in sterile fashion. With real-time ultrasound guidance an arterial catheter was placed into the left radial artery.  Appropriate arterial tracings confirmed on monitor.     Complications/Tolerance None; patient tolerated the procedure well.   EBL Minimal   Specimen(s) None

## 2020-04-30 NOTE — Progress Notes (Addendum)
TCTS BRIEF SICU PROGRESS NOTE  1 Day Post-Op  S/P Procedure(s) (LRB): MITRAL VALVE (MV) REPLACEMENT USING ON-X VALVE SIZE 27/29MM (N/A) MAZE (N/A) TRANSESOPHAGEAL ECHOCARDIOGRAM (TEE) (N/A) CLIPPING OF ATRIAL APPENDAGE USING ATRICURE  PRO2 CLIP SIZE 45MM   Stable day Ambulated w/ assistance Maintaining NSR w/ stable BP Breathing comfortably on nasal cannula Extremities warm, well perfused UOP marginal but adequate Creatinine up 2.66  Plan: Will check repeat co-ox, add low dose dopamine and increase milrinone 0.5.  Monitor closely  Rexene Alberts, MD 04/30/2020 6:43 PM

## 2020-04-30 NOTE — Progress Notes (Addendum)
TCTS DAILY ICU PROGRESS NOTE                   Leslie [name].[address]            ,[place] [address]          [id number]   1 Day Post-Op Procedure(s) (LRB): MITRAL VALVE (MV) REPLACEMENT USING ON-X VALVE SIZE 27/29MM (N/A) MAZE (N/A) TRANSESOPHAGEAL ECHOCARDIOGRAM (TEE) (N/A) CLIPPING OF ATRIAL APPENDAGE USING ATRICURE  PRO2 CLIP SIZE 45MM  Total Length of Stay:  LOS: 1 day   Subjective:  Patient extubated early this morning.  Pain is about a 5 responds to medication.  Denies N/V.    Objective: Vital signs in last 24 hours: Temp:  [96.44 F (35.8 C)-100.76 F (38.2 C)] 100.04 F (37.8 C) (04/13 0700) Pulse Rate:  [78-181] 80 (04/13 0700) Cardiac Rhythm: A-V Sequential paced (04/13 0400) Resp:  [12-30] 27 (04/13 0700) BP: (79-133)/(52-83) 130/72 (04/13 0700) SpO2:  [88 %-100 %] 88 % (04/13 0700) Arterial Line BP: (84-158)/(47-78) 147/49 (04/13 0700) FiO2 (%):  [40 %-50 %] 40 % (04/13 0350) Weight:  [115.7 kg] 115.7 kg (04/13 0520)  Filed Weights   04/29/20 0553 04/30/20 0520  Weight: 111.3 kg 115.7 kg    Weight change: 4.4 kg   Hemodynamic parameters for last 24 hours: PAP: (36-68)/(19-38) 68/32 CO:  [2.3 L/min-5 L/min] 5 L/min CI:  [1.1 L/min/m2-2.3 L/min/m2] 2.3 L/min/m2  Intake/Output from previous day: 04/12 0701 - 04/13 0700 In: 4872.9 [I.V.:3748.6; Blood:230; IV Piggyback:894.3] Out: 0626 [Urine:2560; Blood:650; Chest Tube:628]  Current Meds: Scheduled Meds: . acetaminophen  1,000 mg Oral Q6H  . aspirin EC  325 mg Oral Daily  . [START ON 05/03/2020] atorvastatin  80 mg Oral QPM  . bisacodyl  10 mg Oral Daily   Or  . bisacodyl  10 mg Rectal Daily  . Chlorhexidine Gluconate Cloth  6 each Topical Daily  . docusate sodium  200 mg Oral Daily  . [START ON 05/01/2020] enoxaparin (LOVENOX) injection  30 mg Subcutaneous QHS  . [START ON 05/02/2020] ferrous [id number] C-folic acid  1 capsule Oral Q breakfast  . insulin aspart  0-24 Units  Subcutaneous Q4H  . metoCLOPramide (REGLAN) injection  10 mg Intravenous Q6H  . [START ON 05/01/2020] pantoprazole  40 mg Oral Daily  . sodium chloride flush  3 mL Intravenous Q12H  . warfarin  2.5 mg Oral q1600  . Warfarin - Physician Dosing Inpatient   Does not apply q1600   Continuous Infusions: . sodium chloride    . cefUROXime (ZINACEF)  IV Stopped (04/30/20 0427)  . dexmedetomidine (PRECEDEX) IV infusion Stopped (04/29/20 1646)  . insulin 2.4 mL/hr at 04/30/20 0700  . lactated ringers    . lactated ringers    . milrinone 0.375 mcg/kg/min (04/30/20 0700)  . nitroGLYCERIN    . phenylephrine (NEO-SYNEPHRINE) Adult infusion Stopped (04/30/20 0353)  . potassium chloride 10 mEq (04/30/20 0800)   PRN Meds:.dextrose, fentaNYL (SUBLIMAZE) injection, fluticasone, ondansetron (ZOFRAN) IV, sodium chloride flush, traMADol  General appearance: alert, cooperative and no distress Heart: regular rate and rhythm Lungs: clear to auscultation bilaterally Abdomen: soft, non-tender; bowel sounds normal; no masses,  no organomegaly Extremities: edema trace Wound: aquacel in place  Lab Results: CBC: Recent Labs    04/29/20 2016 04/29/20 2308 04/30/20 0420 04/30/20 0610  WBC 12.9*  --  16.2*  --   HGB 9.2*   < > 8.7* 9.5*  HCT 28.1*   < > 26.5* 28.0*  PLT 166  --  164  --    < > = values in this interval not displayed.   BMET:  Recent Labs    04/29/20 2016 04/29/20 2308 04/30/20 0420 04/30/20 0610  NA 139   < > 140 142  K 4.3   < > 3.7 3.8  CL 111  --  110  --   CO2 23  --  22  --   GLUCOSE 160*  --  146*  --   BUN 16  --  19  --   CREATININE 1.41*  --  1.75*  --   CALCIUM 7.2*  --  7.7*  --    < > = values in this interval not displayed.    CMET: Lab Results  Component Value Date   WBC 16.2 (H) 04/30/2020   HGB 9.5 (L) 04/30/2020   HCT 28.0 (L) 04/30/2020   PLT 164 04/30/2020   GLUCOSE 146 (H) 04/30/2020   CHOL 204 (H) 03/07/2020   TRIG 224 (H) 03/07/2020   HDL 35  (L) 03/07/2020   LDLCALC 129 (H) 03/07/2020   ALT 31 04/25/2020   AST 34 04/25/2020   NA 142 04/30/2020   K 3.8 04/30/2020   CL 110 04/30/2020   CREATININE 1.75 (H) 04/30/2020   BUN 19 04/30/2020   CO2 22 04/30/2020   TSH 0.250 (L) 03/07/2020   INR 2.3 (H) 04/29/2020   HGBA1C 6.5 (H) 04/25/2020      PT/INR:  Recent Labs    04/29/20 1412  LABPROT 25.6*  INR 2.3*   Radiology: [facility] Chest Port 1 View  Result Date: 04/29/2020 CLINICAL DATA:  Status post mitral valve surgery EXAM: PORTABLE CHEST 1 VIEW COMPARISON:  04/25/2020 FINDINGS: Endotracheal tube is noted at the level of the carina directed towards right mainstem bronchus. Gastric catheter is noted coiled upon itself in the distal esophagus with the tip in the pharynx. This should be withdrawn and readvanced. Swan-Ganz catheter is noted in the pulmonary outflow tract. Changes of mitral valve replacement and left atrial appendage clipping are seen. Vascular congestion is noted without significant edema. No pneumothorax is noted. Mediastinal drain and left thoracostomy catheter as well as a pericardial drain are seen. IMPRESSION: Tubes and lines as described above. The gastric catheter should be withdrawn completely and readvanced. Endotracheal tube is noted at the level of the carina directed towards the mainstem bronchus on the right. Increased vascular congestion without significant edema. These results will be called to the ordering clinician or representative by the Radiologist Assistant, and communication documented in the PACS or [employer]. Electronically Signed   By: Inez Catalina M.D.   On: 04/29/2020 14:17     Assessment/Plan: S/P Procedure(s) (LRB): MITRAL VALVE (MV) REPLACEMENT USING ON-X VALVE SIZE 27/29MM (N/A) MAZE (N/A) TRANSESOPHAGEAL ECHOCARDIOGRAM (TEE) (N/A) CLIPPING OF ATRIAL APPENDAGE USING ATRICURE  PRO2 CLIP SIZE 45MM  1. CV- NSR in the 70s under pacer, start coumadin for mechanical MVR 2. Pulm-  extubated around 4 this morning, CXR with atelectasis, no significant pleural effusions, CT output 628 since surgery 3. Renal- creatinine at 1.75, K is WNL at 3.7, weight is elevated about 10 lbs, IV diuretics today 4. GU- hematuria, monitor 5. Expected post operative blood loss anemia, Hgb at 9.5 6. CBGs- mostly controlled, wean insulin drip, continue SSIP 7. Dispo- patient stable, extubated earlier this morning, in NSR under pacer, start coumadin for mechanical valve, hematuria, POD #1 orders as placed by Dr. Carmin Muskrat  Barrett, PA-C 04/30/2020 8:08 AM    I have seen and examined the patient and agree with the assessment and plan as outlined.  Looks good POD1.  Maintaining NSR w/ stable hemodynamics on milrinone 0.375.  Breathing comfortably w/ O2 sats 90-92% on 4 L/min via [place].  CXR looks okay w/ low lung volumes and diffuse opacity c/w acute on chronic diastolic CHF.  Post op elevated serum creatinine - acute exacerbation of baseline CKD, likely due to prerenal azotemia +/- acute kidney injury caused by ATN, UOP adequate.   Mobilize  Check co-ox and d/c Swan-Ganz  Start lasix drip  Leave chest tubes in place for now  Restart Coumadin   Rexene Alberts, MD 04/30/2020 9:03 AM

## 2020-04-30 NOTE — Progress Notes (Signed)
Rapid wean initiated per protocol  

## 2020-05-01 ENCOUNTER — Inpatient Hospital Stay (HOSPITAL_COMMUNITY): Payer: 59

## 2020-05-01 DIAGNOSIS — J810 Acute pulmonary edema: Secondary | ICD-10-CM

## 2020-05-01 DIAGNOSIS — J9601 Acute respiratory failure with hypoxia: Secondary | ICD-10-CM

## 2020-05-01 DIAGNOSIS — Z954 Presence of other heart-valve replacement: Secondary | ICD-10-CM | POA: Diagnosis not present

## 2020-05-01 DIAGNOSIS — N179 Acute kidney failure, unspecified: Secondary | ICD-10-CM | POA: Diagnosis not present

## 2020-05-01 DIAGNOSIS — I342 Nonrheumatic mitral (valve) stenosis: Secondary | ICD-10-CM | POA: Diagnosis not present

## 2020-05-01 DIAGNOSIS — T8111XA Postprocedural  cardiogenic shock, initial encounter: Secondary | ICD-10-CM | POA: Diagnosis not present

## 2020-05-01 LAB — BASIC METABOLIC PANEL
Anion gap: 12 (ref 5–15)
BUN: 33 mg/dL — ABNORMAL HIGH (ref 6–20)
CO2: 23 mmol/L (ref 22–32)
Calcium: 8.4 mg/dL — ABNORMAL LOW (ref 8.9–10.3)
Chloride: 100 mmol/L (ref 98–111)
Creatinine, Ser: 3.38 mg/dL — ABNORMAL HIGH (ref 0.44–1.00)
GFR, Estimated: 16 mL/min — ABNORMAL LOW (ref >60)
Glucose, Bld: 167 mg/dL — ABNORMAL HIGH (ref 70–99)
Potassium: 3.7 mmol/L (ref 3.5–5.1)
Sodium: 135 mmol/L (ref 135–145)

## 2020-05-01 LAB — CBC
HCT: 27.6 % — ABNORMAL LOW (ref 36.0–46.0)
Hemoglobin: 9.2 g/dL — ABNORMAL LOW (ref 12.0–15.0)
MCH: 29.1 pg (ref 26.0–34.0)
MCHC: 33.3 g/dL (ref 30.0–36.0)
MCV: 87.3 fL (ref 80.0–100.0)
Platelets: 145 10*3/uL — ABNORMAL LOW (ref 150–400)
RBC: 3.16 MIL/uL — ABNORMAL LOW (ref 3.87–5.11)
RDW: 14.6 % (ref 11.5–15.5)
WBC: 18.2 10*3/uL — ABNORMAL HIGH (ref 4.0–10.5)
nRBC: 0.2 % (ref 0.0–0.2)

## 2020-05-01 LAB — COOXEMETRY PANEL
Carboxyhemoglobin: 0.6 % (ref 0.5–1.5)
Methemoglobin: 1.4 % (ref 0.0–1.5)
O2 Saturation: 59 %
Total hemoglobin: 9.1 g/dL — ABNORMAL LOW (ref 12.0–16.0)

## 2020-05-01 LAB — GLUCOSE, CAPILLARY
Glucose-Capillary: 135 mg/dL — ABNORMAL HIGH (ref 70–99)
Glucose-Capillary: 136 mg/dL — ABNORMAL HIGH (ref 70–99)
Glucose-Capillary: 140 mg/dL — ABNORMAL HIGH (ref 70–99)
Glucose-Capillary: 148 mg/dL — ABNORMAL HIGH (ref 70–99)
Glucose-Capillary: 154 mg/dL — ABNORMAL HIGH (ref 70–99)
Glucose-Capillary: 161 mg/dL — ABNORMAL HIGH (ref 70–99)
Glucose-Capillary: 168 mg/dL — ABNORMAL HIGH (ref 70–99)

## 2020-05-01 LAB — PROTIME-INR
INR: 2.1 — ABNORMAL HIGH (ref 0.8–1.2)
Prothrombin Time: 23.9 seconds — ABNORMAL HIGH (ref 11.4–15.2)

## 2020-05-01 LAB — SODIUM, URINE, RANDOM: Sodium, Ur: <10 mmol/L

## 2020-05-01 MED ORDER — POTASSIUM CHLORIDE CRYS ER 20 MEQ PO TBCR
20.0000 meq | EXTENDED_RELEASE_TABLET | Freq: Once | ORAL | Status: AC
  Administered 2020-05-01: 20 meq via ORAL
  Filled 2020-05-01: qty 1

## 2020-05-01 MED ORDER — METOLAZONE 5 MG PO TABS
5.0000 mg | ORAL_TABLET | Freq: Once | ORAL | Status: AC
  Administered 2020-05-01: 5 mg via ORAL
  Filled 2020-05-01: qty 1

## 2020-05-01 MED ORDER — POTASSIUM CHLORIDE CRYS ER 20 MEQ PO TBCR: 40.0000 meq | EXTENDED_RELEASE_TABLET | Freq: Once | ORAL | Status: DC

## 2020-05-01 MED FILL — Mannitol IV Soln 20%: INTRAVENOUS | Qty: 500 | Status: AC

## 2020-05-01 MED FILL — Lidocaine HCl Local Preservative Free (PF) Inj 2%: INTRAMUSCULAR | Qty: 15 | Status: AC

## 2020-05-01 MED FILL — Albumin, Human Inj 5%: INTRAVENOUS | Qty: 250 | Status: AC

## 2020-05-01 MED FILL — Heparin Sodium (Porcine) Inj 1000 Unit/ML: INTRAMUSCULAR | Qty: 30 | Status: AC

## 2020-05-01 MED FILL — Electrolyte-R (PH 7.4) Solution: INTRAVENOUS | Qty: 5000 | Status: AC

## 2020-05-01 MED FILL — Sodium Bicarbonate IV Soln 8.4%: INTRAVENOUS | Qty: 50 | Status: AC

## 2020-05-01 MED FILL — Potassium Chloride Inj 2 mEq/ML: INTRAVENOUS | Qty: 40 | Status: AC

## 2020-05-01 MED FILL — Sodium Chloride IV Soln 0.9%: INTRAVENOUS | Qty: 2000 | Status: AC

## 2020-05-01 NOTE — TOC Initial Note (Signed)
Transition of Care (TOC) - Initial/Assessment Note  Heart Failure   Patient Details  Name: Leslie [name] MRN: [id number] Date of Birth: [birth year]  Transition of Care [facility]) CM/SW Contact:    [place], [place] Phone Number: 05/01/2020, 9:53 AM  Clinical Narrative:    CSW attempted to visit the patient at bedside to introduce self as the heart failure social worker and to complete a very brief SDOH screening with the patient to address social needs as needed however the patient was unresponsive and unable to engage in conversation at this time.   CSW will continue to follow for discharge needs and will check back with the patient at another time.                 Barriers to Discharge: Continued Medical Work up   Patient Goals and CMS Choice        Expected Discharge Plan and Services   In-house Referral: Clinical Social Work                                            Prior Living Arrangements/Services                       Activities of Daily Living      Permission Sought/Granted Permission sought to share information with : Case Manager                Emotional Assessment Appearance:: Appears stated age Attitude/Demeanor/Rapport: Unresponsive,Unable to Assess Affect (typically observed): Unable to Assess     Psych Involvement: No (comment)  Admission diagnosis:  S/P MVR (mitral valve replacement) [Z95.2] Patient Active Problem List   Diagnosis Date Noted  . 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 S/P Maze operation for atrial fibrillation 04/29/2020  . S/P MVR (mitral valve replacement) 04/29/2020  . BMI 39.0-39.9,adult 09/30/2019  . 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 Right knee pain 11/27/2018  . GERD (gastroesophageal reflux disease) 10/25/2018  . CKD (chronic kidney disease), stage III ([place]) 10/25/2018  . CHF (congestive heart failure) ([place]) 10/25/2018  .  Absolute anemia 10/01/2018  . Iron deficiency anemia secondary to blood loss (chronic)   . Polyp of descending colon   . (HFpEF) heart failure with preserved ejection fraction ([place])   . Renal calculus, right 07/03/2018  . Low back pain 06/27/2018  . Pedal edema 06/27/2018  . Abnormal renal function 02/16/2018  . Acute recurrent pansinusitis 01/13/2018  . Anxiety 01/13/2018  . Osteoarthritis of knee 01/09/2018  . Morbid obesity with body mass index (BMI) of 40.0 or higher ([place]) 11/13/2017  . Major depressive disorder 09/22/2017  . Mitral valve stenosis   . Atrial fibrillation ([place]) 09/11/2017  . Hypertension 06/06/2017  . Tobacco dependency 06/06/2017  . Acute upper respiratory infection 04/28/2017  . Pain of left lower extremity 02/11/2016  . 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 Fibrocystic breast disease 03/23/2013   PCP:  Lavera Guise, MD Pharmacy:   [id number] - [address], [address] [place] [address] Phone: [phone] Fax: [phone]     Social Determinants of Health (SDOH) Interventions    Readmission Risk Interventions Readmission Risk Prevention Plan 04/10/2019  Transportation Screening Complete  Home Care Screening Complete  Medication Review (RN CM) Complete  Some recent data might be hidden   Abem Shaddix, MSW, LCSWA [phone] Heart Failure Social Worker

## 2020-05-01 NOTE — Progress Notes (Signed)
Patient ID: Leslie [name], female   DOB: 04-29-1965, 55 y.o.   MRN: [id number] TCTS Evening Rounds:  Hemodynamically stable on milrinone 0.3 and dop 3. MAP is 70's  Lasix drip was at 15 mg/hr without response and now at 30 mg/hr.  sats 99% on [place] 6L.  Sitting up in chair.

## 2020-05-01 NOTE — Consult Note (Signed)
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  She was extubated postoperatively on 4/13.  She subsequently developed hypotension with low Coox and was started on dopamine and milrinone with improvement in her cooximetry.  She has had minimal urine output with rapidly increasing creatinine within normal baseline.  This morning she was started on a Lasix drip with minimal urine output so far.  She has respiratory failure requiring BiPAP to maintain adequate saturations overnight.  She currently only complains of mild sternal chest pain.  She denies any pre-existing history of lung disease, but has reported history of ILD in her chart.  She had PFTs in 2016 with normal FEV1/FVC ratio, reduced FEV1 (74% of predicted), normal total lung capacity by plethysmography with increased RV,  mildly reduced DLCO (flow volume loops and exact numbers not visible, only the interpretation). PCCM has been consulted for management of respiratory failure.   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 4/12 MAZE, mechanical MVR . 4/13 extubated by rapid wean protocol, hypotension requiring dopamine & milrinone . 4/14 requiring BiPAP, started on lasix drip. Still on inotropes.  Interim History / Subjective:    Objective   Blood pressure (!) 119/92, pulse 100,  temperature 99.1 F (37.3 C), temperature source Oral, resp. rate 17, height '5\' 4"'  (1.626 m), weight 115.7 kg, SpO2 95 %. PAP: (63-71)/(24-36) 71/36  Vent Mode: PSV;BIPAP FiO2 (%):  [40 %-100 %] 40 % PEEP:  [6 cmH20] 6 cmH20 Pressure Support:  [8 cmH20] 8 cmH20   Intake/Output Summary (Last 24 hours) at 05/01/2020 0830 Last data filed at 05/01/2020 0700 Gross per 24 hour  Intake 1160.3 ml  Output 768 ml  Net 392.3 ml   Filed Weights   04/29/20 0553 04/30/20 0520  Weight: 111.3 kg 115.7 kg    Examination: General: Ill-appearing woman sitting up in the chair on BiPAP in no acute distress HENT: [place]/AT, eyes anicteric, BiPAP mask in place. Lungs: Faint rales bilaterally, tachypneic, but no increased work of breathing. BiPAP 14/6. Cardiovascular: K4-M0, mechanical click, regular rate and rhythm.  Surgical dressing remains in place Abdomen: Obese, soft, nontender Extremities: Minimal pedal edema, no clubbing or cyanosis Neuro: Awake and alert, answering questions appropriately, moving all extremities spontaneously GU: Foley catheter remains in place, tea colored urine, minimal output.  Labs/imaging that I have personally reviewed  (right click and "Reselect all SmartList Selections" daily)  WBC 18 H/H 9.2/ 27.6 BUN 33 Creatinine 3.38  Resolved Hospital Problem list     Assessment & Plan:   Acute respiratory failure with hypoxia requiring BiPAP Acute cardiogenic pulmonary edema --Con't BiPAP support. Low threshold to intubate if she deteriorates given cardiogenic shock and lack of rapidly reversible  etiology. -No previous PFTs.  No history of obstructive lung disease, likely no role for bronchodilators. -Titrate FiO2 to maintain SPO2 greater than 90%. --questionable history several  years ago of ILD, but this was apparently based on CXR and could have been more related to atelectasis. She had no restriction on PFTs in 2016.  Cardiogenic shock requiring inotropic support MS s/p  mechanical MVR, MAZE PH, WHO group II -Serial Coox -Continue dopamine and milrinone -Holding Coumadin per primary -Needs diuresis  Oliguric AKI, suspect due to hypoperfusion -Nephrology consultation; anticipate she may need CVVHD support. -Renally dose meds and avoid nephrotoxic meds  Daughter updated via phone and husband at bedside.  Best practice (right click and "Reselect all SmartList Selections" daily)  Diet:  NPO Pain/Anxiety/Delirium protocol (if indicated): No VAP protocol (if indicated): Not indicated DVT prophylaxis: Contraindicated GI prophylaxis: N/A Glucose control:  SSI Yes Central venous access:  N/A Arterial line:  Yes, and it is still needed Foley:  Yes, and it is still needed Mobility:  OOB  PT consulted: N/A Last date of multidisciplinary goals of care discussion [4/14] Code Status:  full code Disposition: ICU  Labs   CBC: Recent Labs  Lab 04/29/20 1412 04/29/20 2016 04/29/20 2308 04/30/20 0420 04/30/20 0610 04/30/20 1654 04/30/20 2026 04/30/20 2201 05/01/20 0328  WBC 13.0* 12.9*  --  16.2*  --  19.1*  --   --  18.2*  HGB 10.3* 9.2*   < > 8.7* 9.5* 8.4* 7.8* 7.8* 9.2*  HCT 30.8* 28.1*   < > 26.5* 28.0* 25.8* 23.0* 23.0* 27.6*  MCV 88.0 89.8  --  89.5  --  91.5  --   --  87.3  PLT 169 166  --  164  --  151  --   --  145*   < > = values in this interval not displayed.    Basic Metabolic Panel: Recent Labs  Lab 04/25/20 1030 04/29/20 0807 04/29/20 1201 04/29/20 1357 04/29/20 2016 04/29/20 2308 04/30/20 0420 04/30/20 0610 04/30/20 1654 04/30/20 2026 04/30/20 2201 05/01/20 0328  NA 137   < > 141   < > 139   < > 140 142 134* 134* 136 135  K 3.5   < > 3.7   < > 4.3   < > 3.7 3.8 3.9 3.8 3.8 3.7  CL 102   < > 101  --  111  --  110  --  104  --   --  100  CO2 25  --   --   --  23  --  22  --  22  --   --  23  GLUCOSE 114*   < > 166*  --  160*  --  146*  --  193*  --   --  167*  BUN 15   < > 20  --  16  --  19  --  26*  --   --  33*   CREATININE 1.23*   < > 1.00  --  1.41*  --  1.75*  --  2.66*  --   --  3.38*  CALCIUM 8.9  --   --   --  7.2*  --  7.7*  --  8.1*  --   --  8.4*  MG  --   --   --   --  3.5*  --  3.2*  --  2.9*  --   --   --    < > = values in this interval not displayed.   GFR: Estimated Creatinine Clearance: 24 mL/min (A) (by C-G formula based on SCr of 3.38 mg/dL (H)). Recent Labs  Lab 04/29/20 2016 04/30/20 0420 04/30/20 1654 05/01/20 0328  WBC 12.9* 16.2* 19.1* 18.2*    Liver Function Tests: Recent Labs  Lab 04/25/20 1030  AST 34  ALT 31  ALKPHOS 144*  BILITOT 0.4  PROT 7.8  ALBUMIN 3.7   No results for input(s): LIPASE, AMYLASE in the last 168 hours. No results for input(s): AMMONIA in the last 168 hours.  ABG    Component Value Date/Time   PHART 7.341 (L) 04/30/2020 2201   PCO2ART 42.2 04/30/2020 2201   PO2ART 88 04/30/2020 2201   HCO3 22.9 04/30/2020 2201   TCO2 24 04/30/2020 2201   ACIDBASEDEF 3.0 (H) 04/30/2020 2201   O2SAT 59.0 05/01/2020 0516     Coagulation Profile: Recent Labs  Lab 04/29/20 0606 04/29/20 1412 05/01/20 0328  INR 1.7* 2.3* 2.1*    Cardiac Enzymes: No results for input(s): CKTOTAL, CKMB, CKMBINDEX, TROPONINI in the last 168 hours.  HbA1C: Hgb A1c MFr Bld  Date/Time Value Ref Range Status  04/25/2020 10:30 AM 6.5 (H) 4.8 - 5.6 % Final    Comment:    (NOTE) Pre diabetes:          5.7%-6.4%  Diabetes:              >6.4%  Glycemic control for   <7.0% adults with diabetes   08/26/2018 08:24 PM 6.0 (H) 4.8 - 5.6 % Final    Comment:    (NOTE) Pre diabetes:          5.7%-6.4% Diabetes:              >6.4% Glycemic control for   <7.0% adults with diabetes     CBG: Recent Labs  Lab 04/30/20 1554 04/30/20 2004 05/01/20 0001 05/01/20 0328 05/01/20 0809  GLUCAP 182* 188* 154* 168* 161*    Review of Systems:   ROS limited due to respiratory failure  Past Medical History:  She,  has a past medical history of (HFpEF) heart  failure with preserved ejection fraction ([place]), Allergy, Anemia, Anxiety, BRCA negative (03/22/2013), Bronchitis (02/19/2016), Chest tightness, CHF (congestive heart failure) ([place]), Cigarette smoker (09/11/2017), Dyspnea, Dysrhythmia, GERD (gastroesophageal reflux disease), Heart murmur, History of kidney stones, Hyperlipidemia, Hypertension, Interstitial lung disease ([place]), Moderate mitral stenosis, Persistent atrial fibrillation ([place]), Pneumonia, S/P Maze operation for atrial fibrillation (04/29/2020), and S/P mitral valve replacement with Onyx bileaflet mechanical valve (04/29/2020).   Surgical History:   Past Surgical History:  Procedure Laterality Date  . ABDOMINAL HYSTERECTOMY     total  . ABDOMINAL HYSTERECTOMY    . BREAST BIOPSY Bilateral 2012  . BREAST BIOPSY Right 08-07-12   fibroadenomatous changes and columnar cells  . BREAST BIOPSY  02/03/2015   stereo byrnett  . BUBBLE STUDY  04/01/2020   Procedure: BUBBLE STUDY;  Surgeon: [name], MD;  Location: [place];  Service: Cardiovascular;;  . CARDIAC CATHETERIZATION    . CHOLECYSTECTOMY N/A 02/27/2016   Procedure: LAPAROSCOPIC CHOLECYSTECTOMY;  Surgeon: Christene Lye, MD;  Location: [facility];  Service: General;  Laterality: N/A;  . CLIPPING OF ATRIAL APPENDAGE  04/29/2020   Procedure: CLIPPING OF ATRIAL APPENDAGE USING ATRICURE  PRO2 CLIP SIZE 45MM;  Surgeon: Rexene Alberts, MD;  Location[facility];  Service: Open Heart Surgery;;  . COLONOSCOPY WITH PROPOFOL N/A 09/26/2018   Procedure: COLONOSCOPY WITH PROPOFOL;  Surgeon: Lucilla Lame, MD;  Location[facility] ENDOSCOPY;  Service: Endoscopy;  Laterality: N/A;  . CYSTOSCOPY W/ RETROGRADES Right 08/18/2018   Procedure: CYSTOSCOPY WITH RETROGRADE PYELOGRAM;  Surgeon: Billey Co, MD;  Location: [facility];  Service: Urology;  Laterality: Right;  . CYSTOSCOPY/URETEROSCOPY/HOLMIUM LASER/STENT PLACEMENT Right 08/18/2018   Procedure: CYSTOSCOPY/URETEROSCOPY/STENT PLACEMENT;   Surgeon: Billey Co, MD;  Location: [facility];  Service: Urology;  Laterality: Right;  . DIAGNOSTIC LAPAROSCOPY    . ESOPHAGOGASTRODUODENOSCOPY (EGD) WITH PROPOFOL N/A 09/26/2018   Procedure: ESOPHAGOGASTRODUODENOSCOPY (EGD) WITH PROPOFOL;  Surgeon: Lucilla Lame, MD;  Location[facility] ENDOSCOPY;  Service: Endoscopy;  Laterality: N/A;  . GIVENS CAPSULE STUDY N/A 11/03/2018   Procedure: GIVENS CAPSULE STUDY;  Surgeon: Lucilla Lame, MD;  Location: [facility] ENDOSCOPY;  Service: Endoscopy;  Laterality: N/A;  . JOINT REPLACEMENT Left    knee  . KNEE ARTHROPLASTY Right 04/09/2019   Procedure: COMPUTER ASSISTED TOTAL KNEE ARTHROPLASTY;  Surgeon: Dereck Leep, MD;  Location: [facility];  Service: Orthopedics;  Laterality: Right;  . KNEE CLOSED REDUCTION Left 04/15/2015   Procedure: CLOSED MANIPULATION KNEE;  Surgeon: Thornton Park, MD;  Location: [facility];  Service: Orthopedics;  Laterality: Left;  . KNEE SURGERY    . MAZE N/A 04/29/2020   Procedure: MAZE;  Surgeon: Rexene Alberts, MD;  Location: [place];  Service: Open Heart Surgery;  Laterality: N/A;  . MITRAL VALVE REPLACEMENT N/A 04/29/2020   Procedure: MITRAL VALVE (MV) REPLACEMENT USING ON-X VALVE SIZE 27/29MM;  Surgeon: Rexene Alberts, MD;  Location: [place];  Service: Open Heart Surgery;  Laterality: N/A;  . MULTIPLE EXTRACTIONS WITH ALVEOLOPLASTY N/A 02/28/2020   Procedure: MULTIPLE EXTRACTION WITH ALVEOLOPLASTY;  Surgeon: [name], DMD;  Location: [place];  Service: Dentistry;  Laterality: N/A;  . RIGHT/LEFT HEART CATH AND CORONARY ANGIOGRAPHY Bilateral 09/06/2019   Procedure: RIGHT/LEFT HEART CATH AND CORONARY ANGIOGRAPHY;  Surgeon: Minna Merritts, MD;  Location: [place] [facility];  Service: Cardiovascular;  Laterality: Bilateral;  . TEE WITHOUT CARDIOVERSION N/A 09/13/2017   Procedure: TRANSESOPHAGEAL ECHOCARDIOGRAM (TEE);  Surgeon: Nelva Bush, MD;  Location: [facility];  Service: Cardiovascular;  Laterality: N/A;  . TEE WITHOUT  CARDIOVERSION N/A 04/01/2020   Procedure: TRANSESOPHAGEAL ECHOCARDIOGRAM (TEE);  Surgeon: [name], MD;  Location: [facility] ENDOSCOPY;  Service: Cardiovascular;  Laterality: N/A;  . TEE WITHOUT CARDIOVERSION N/A 04/29/2020   Procedure: TRANSESOPHAGEAL ECHOCARDIOGRAM (TEE);  Surgeon: Rexene Alberts, MD;  Location: [place];  Service: Open Heart Surgery;  Laterality: N/A;  . TOTAL KNEE ARTHROPLASTY Left 12/25/2014   Procedure: TOTAL KNEE ARTHROPLASTY;  Surgeon: Thornton Park, MD;  Location: [facility];  Service: Orthopedics;  Laterality: Left;  . TUBAL LIGATION       Social History:   reports that she quit smoking about 2 years ago. Her smoking use included cigarettes. She has a 10.00 pack-year smoking history. She has never used smokeless tobacco. She reports current alcohol use. She reports that she does not use drugs.   Family History:  Her family history includes Cancer in her maternal aunt and maternal grandmother; Cancer (age of onset: 23) in her mother; Hypertension in her father and mother; Osteoarthritis in her father.   Allergies Allergies  Allergen Reactions  . Morphine Hives and Rash  . Oxycodone Hcl Hives and Itching  . Dilaudid [Hydromorphone Hcl] Itching     Home Medications  Prior to Admission medications   Medication Sig Start Date End Date Taking? Authorizing Provider  acetaminophen (TYLENOL) 500 MG tablet Take 1,000 mg by mouth every 6 (six) hours as needed for moderate pain.   Yes [provider]  ALPRAZolam Duanne Moron) 0.5 MG tablet TAKE  1 TABLET(0.5 MG) BY MOUTH AT BEDTIME AS NEEDED FOR ANXIETY Patient taking differently: Take 0.5 mg by mouth daily as needed for anxiety or sleep. 04/11/20  Yes Luiz Ochoa, NP  atorvastatin (LIPITOR) 80 MG tablet TAKE 1 TABLET BY MOUTH EVERY DAY AT 6 PM Patient taking differently: Take 80 mg by mouth every evening. 12/12/19  Yes Loel Dubonnet, NP  calcium carbonate (TUMS - DOSED IN MG ELEMENTAL CALCIUM) 500 MG chewable  tablet Chew 500 mg by mouth daily as needed for indigestion or heartburn.   Yes [provider]  diltiazem (CARDIZEM CD) 120 MG 24 hr capsule TAKE 1 CAPSULE(120 MG) BY MOUTH DAILY Patient taking differently: Take 120 mg by mouth daily. 02/01/20  Yes Luiz Ochoa, NP  fluticasone (FLONASE) 50 MCG/ACT nasal spray Place 1 spray into both nostrils daily as needed for rhinitis.   Yes [provider]  metoprolol succinate (TOPROL-XL) 50 MG 24 hr tablet TAKE 1 TABLET BY MOUTH EVERY MORNING AND 2 TABLETS BY MOUTH EVERY NIGHT AT BEDTIME Patient taking differently: Take 50-100 mg by mouth See admin instructions. Take 1 tablet (50 mg) by mouth in the morning and take 2 tablets (100 mg) at bedtime 12/12/19  Yes Loel Dubonnet, NP  omeprazole (PRILOSEC) 40 MG capsule Take 1 capsule (40 mg total) by mouth daily. 12/06/19  Yes Boscia, Greer Ee, NP  potassium chloride (KLOR-CON) 10 MEQ tablet Take 30 mg in the morning and 20 mg at bedtime Patient taking differently: Take 20-30 mEq by mouth See admin instructions. Take 3 tablets (30 meq) by mouth in the morning & take 20 meq by mouth in the evening 03/10/20  Yes Loel Dubonnet, NP  torsemide (DEMADEX) 20 MG tablet TAKE 2 TABLETS(40 MG) BY MOUTH TWICE DAILY Patient taking differently: Take 40 mg by mouth in the morning and at bedtime. 03/03/20  Yes Minna Merritts, MD  warfarin (COUMADIN) 2.5 MG tablet TAKE AS DIRECTED BY ANTI-COAG CLINIC Patient taking differently: Take 1.25 mg by mouth daily at 4 PM. 12/19/19  Yes Gollan, Kathlene November, MD  albuterol (VENTOLIN HFA) 108 (90 Base) MCG/ACT inhaler Inhale 2 puffs into the lungs every 6 (six) hours as needed for wheezing or shortness of breath. 01/25/20   Luiz Ochoa, NP  amoxicillin (AMOXIL) 500 MG tablet Take 2,000 mg by mouth See admin instructions. Take 4 capsules (2000 mg) by mouth 1 hour prior to dental work 03/06/20   [provider]  chlorpheniramine-HYDROcodone (TUSSIONEX  PENNKINETIC ER) 10-8 MG/5ML SUER Take 5 mLs by mouth at bedtime as needed for cough. Patient not taking: No sig reported 04/03/20   Luiz Ochoa, NP  enoxaparin (LOVENOX) 120 MG/0.8ML injection Inject 0.74 mLs (111 mg total) into the skin daily for 4 days. 04/24/20 04/28/20  Rexene Alberts, MD     Critical care time: 40 minutes      Julian Hy, DO 05/01/20 12:43 PM [facility]

## 2020-05-01 NOTE — Consult Note (Signed)
Reason for Consult: Renal failure Referring Physician:  Dr. Darylene Price  Chief Complaint: Palpitations and shortness of breath  Assessment/Plan: 1. AKI or CKD with baseline cr 1.1-1.3 with h/o renal failure in the past when she had bleeding after removal of a stent in the right ureter in 2018 but did not require RRT. AKI from ATN from hypoperfusion ischemic injury and she meets AKIN stage III classification. - Will check a bladder scan to ensure foley is not obstructed to be complete. - No absolute indication for RRT and will follow closely with you; she required BiPAP and was started on Lasix gtt w/ poor response. No uremic symptoms or electrolyte derangements requiring emergent dialysis. - Patient +1625 ml during this hospitalization.  - If UOP does not pick up over next next 24-48hrs she will likely need to start RRT - Avoid nephrotoxins as you are already doing; judicious use of contrast if needed. - Dose medications for a GFR < 15 ml/min  2. S/p MVR for rheumatic mitral valve disease 3. Nephrolithiasis - recently seen by urology who did not think the right flank pain was due to nephrolithiasis given stability on comparison CT's.  4. Anemia - transfuse as needed.  5. HFpEF - compensated at this moment 6. HTN - controlled 7. Afib - s/p MAZE   HPI: CARRIN VANNOSTRAND is an 55 y.o. female w/ history of mitral stenosis,  dCHF,  atrial fibrillation, HTN HLD GERD CKD with a baseline cr of 1.1-1.3, nephrolithiasis (seen by urology 03/27/2020 with h/o stent in right ureter in 2018). She had right flank pain in 03/2020 and CT showed unchanged 1cm calcification in the right kidney upper pole unchanged from 12/2018 with no e/o obstruction. She has  a history of intermittent palpitations and exertional dyspnea for several years. 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 Unfortunately she had worsening dyspnea with progressive exertional shortness of breath and fluid  retention requiring higher diuretic dosing. Over the past 6 mths she has progressed to not being able to climb a flight of stairs without stopping and more prolonged episodes of palpitations and orthopnea. Patient had a mechanical mitral valve replacement and Maze procedure w/ clipping of the LA appendage on 04/29/2020. Total cardiopulmonary bypass time was 128 minutes + 2U PRBC's. Hypotensive episodes during surgery as would be expected but not for prolonged periods of time. Patient required inotropic support with dopamine and milrinone and started on Lasix gtt with poor response. She denies fevers, chills, or productive cough.   ROS Pertinent items are noted in HPI.  Chemistry and CBC: Creatinine  Date/Time Value Ref Range Status  05/08/2014 01:10 PM 1.08 (H) mg/dL Final    Comment:    0.44-1.00 NOTE: New Reference Range  03/26/14   04/05/2012 08:27 AM 0.89 0.60 - 1.30 mg/dL Final   Creatinine, Ser  Date/Time Value Ref Range Status  05/01/2020 03:28 AM 3.38 (H) 0.44 - 1.00 mg/dL Final  04/30/2020 04:54 PM 2.66 (H) 0.44 - 1.00 mg/dL Final  04/30/2020 04:20 AM 1.75 (H) 0.44 - 1.00 mg/dL Final  04/29/2020 08:16 PM 1.41 (H) 0.44 - 1.00 mg/dL Final  04/29/2020 12:01 PM 1.00 0.44 - 1.00 mg/dL Final  04/29/2020 11:24 AM 1.10 (H) 0.44 - 1.00 mg/dL Final  04/29/2020 10:54 AM 1.00 0.44 - 1.00 mg/dL Final  04/29/2020 10:03 AM 1.00 0.44 - 1.00 mg/dL Final  04/29/2020 09:20 AM 1.10 (H) 0.44 - 1.00 mg/dL Final  04/29/2020 08:21 AM 1.10 (H)  0.44 - 1.00 mg/dL Final  04/25/2020 10:30 AM 1.23 (H) 0.44 - 1.00 mg/dL Final  03/26/2020 11:29 AM 1.18 (H) 0.44 - 1.00 mg/dL Final  03/07/2020 10:39 AM 1.26 (H) 0.57 - 1.00 mg/dL Final  02/28/2020 08:48 AM 1.27 (H) 0.44 - 1.00 mg/dL Final  12/31/2019 11:02 AM 1.18 (H) 0.57 - 1.00 mg/dL Final  09/05/2019 04:38 PM 1.71 (H) 0.44 - 1.00 mg/dL Final  08/01/2019 11:52 AM 1.49 (H) 0.44 - 1.00 mg/dL Final  04/09/2019 10:35 AM 1.40 (H) 0.44 - 1.00 mg/dL Final   03/30/2019 11:28 AM 1.33 (H) 0.44 - 1.00 mg/dL Final  12/25/2018 10:33 AM 1.34 (H) 0.57 - 1.00 mg/dL Final  12/04/2018 11:57 AM 1.66 (H) 0.44 - 1.00 mg/dL Final  11/28/2018 02:08 PM 1.34 (H) 0.44 - 1.00 mg/dL Final  11/22/2018 02:00 PM 1.39 (H) 0.44 - 1.00 mg/dL Final  11/13/2018 01:26 PM 1.41 (H) 0.44 - 1.00 mg/dL Final  09/19/2018 09:32 AM 1.70 (H) 0.44 - 1.00 mg/dL Final  09/01/2018 06:31 AM 1.77 (H) 0.44 - 1.00 mg/dL Final  08/31/2018 05:52 AM 2.38 (H) 0.44 - 1.00 mg/dL Final  08/30/2018 06:15 AM 2.86 (H) 0.44 - 1.00 mg/dL Final  08/29/2018 06:08 AM 3.79 (H) 0.44 - 1.00 mg/dL Final  08/28/2018 05:54 AM 4.10 (H) 0.44 - 1.00 mg/dL Final  08/27/2018 04:20 AM 3.24 (H) 0.44 - 1.00 mg/dL Final  08/26/2018 12:28 PM 2.13 (H) 0.44 - 1.00 mg/dL Final  08/15/2018 04:02 PM 1.25 (H) 0.44 - 1.00 mg/dL Final  03/16/2018 10:43 AM 1.10 (H) 0.57 - 1.00 mg/dL Final  02/07/2018 08:02 AM 1.30 (H) 0.44 - 1.00 mg/dL Final  02/03/2018 11:56 AM 1.16 (H) 0.44 - 1.00 mg/dL Final  01/17/2018 07:24 PM 1.00 0.44 - 1.00 mg/dL Final  01/13/2018 01:12 PM 1.06 (H) 0.44 - 1.00 mg/dL Final  01/06/2018 02:57 PM 1.24 (H) 0.57 - 1.00 mg/dL Final  09/23/2017 08:22 AM 1.02 (H) 0.57 - 1.00 mg/dL Final  09/13/2017 07:47 AM 1.11 (H) 0.44 - 1.00 mg/dL Final  09/12/2017 02:57 AM 1.05 (H) 0.44 - 1.00 mg/dL Final  09/11/2017 11:32 AM 1.06 (H) 0.44 - 1.00 mg/dL Final  12/26/2014 08:51 AM 0.97 0.44 - 1.00 mg/dL Final  12/11/2014 10:06 AM 0.96 0.44 - 1.00 mg/dL Final   Recent Labs  Lab 04/25/20 1030 04/29/20 0807 04/29/20 1054 04/29/20 1124 04/29/20 1158 04/29/20 1201 04/29/20 1357 04/29/20 2016 04/29/20 2308 04/30/20 0417 04/30/20 0420 04/30/20 0610 04/30/20 1654 04/30/20 2026 04/30/20 2201 05/01/20 0328  NA 137   < > 139 138   < > 141   < > 139   < > 143 140 142 134* 134* 136 135  K 3.5   < > 3.7 4.3   < > 3.7   < > 4.3   < > 3.6 3.7 3.8 3.9 3.8 3.8 3.7  CL 102   < > 99 100  --  101  --  111  --   --  110  --   104  --   --  100  CO2 25  --   --   --   --   --   --  23  --   --  22  --  22  --   --  23  GLUCOSE 114*   < > 163* 162*  --  166*  --  160*  --   --  146*  --  193*  --   --  167*  BUN 15   < >  20 21*  --  20  --  16  --   --  19  --  26*  --   --  33*  CREATININE 1.23*   < > 1.00 1.10*  --  1.00  --  1.41*  --   --  1.75*  --  2.66*  --   --  3.38*  CALCIUM 8.9  --   --   --   --   --   --  7.2*  --   --  7.7*  --  8.1*  --   --  8.4*   < > = values in this interval not displayed.   Recent Labs  Lab 04/29/20 2016 04/29/20 2308 04/30/20 0420 04/30/20 0610 04/30/20 1654 04/30/20 2026 04/30/20 2201 05/01/20 0328  WBC 12.9*  --  16.2*  --  19.1*  --   --  18.2*  HGB 9.2*   < > 8.7*   < > 8.4* 7.8* 7.8* 9.2*  HCT 28.1*   < > 26.5*   < > 25.8* 23.0* 23.0* 27.6*  MCV 89.8  --  89.5  --  91.5  --   --  87.3  PLT 166  --  164  --  151  --   --  145*   < > = values in this interval not displayed.   Liver Function Tests: Recent Labs  Lab 04/25/20 1030  AST 34  ALT 31  ALKPHOS 144*  BILITOT 0.4  PROT 7.8  ALBUMIN 3.7   No results for input(s): LIPASE, AMYLASE in the last 168 hours. No results for input(s): AMMONIA in the last 168 hours. Cardiac Enzymes: No results for input(s): CKTOTAL, CKMB, CKMBINDEX, TROPONINI in the last 168 hours. Iron Studies: No results for input(s): IRON, TIBC, TRANSFERRIN, FERRITIN in the last 72 hours. PT/INR: '@LABRCNTIP' (inr:5)  Xrays/Other Studies: ) Results for orders placed or performed during the hospital encounter of 04/29/20 (from the past 48 hour(s))  I-STAT 7, (LYTES, BLD GAS, ICA, H+H)     Status: Abnormal   Collection Time: 04/29/20 11:58 AM  Result Value Ref Range   pH, Arterial 7.407 7.350 - 7.450   pCO2 arterial 41.5 32.0 - 48.0 mmHg   pO2, Arterial 99 83.0 - 108.0 mmHg   Bicarbonate 26.1 20.0 - 28.0 mmol/L   TCO2 27 22 - 32 mmol/L   O2 Saturation 98.0 %   Acid-Base Excess 1.0 0.0 - 2.0 mmol/L   Sodium 140 135 - 145 mmol/L    Potassium 3.7 3.5 - 5.1 mmol/L   Calcium, Ion 0.92 (L) 1.15 - 1.40 mmol/L   HCT 28.0 (L) 36.0 - 46.0 %   Hemoglobin 9.5 (L) 12.0 - 15.0 g/dL   Sample type ARTERIAL   I-STAT, chem 8     Status: Abnormal   Collection Time: 04/29/20 12:01 PM  Result Value Ref Range   Sodium 141 135 - 145 mmol/L   Potassium 3.7 3.5 - 5.1 mmol/L   Chloride 101 98 - 111 mmol/L   BUN 20 6 - 20 mg/dL   Creatinine, Ser 1.00 0.44 - 1.00 mg/dL   Glucose, Bld 166 (H) 70 - 99 mg/dL    Comment: Glucose reference range applies only to samples taken after fasting for at least 8 hours.   Calcium, Ion 0.94 (L) 1.15 - 1.40 mmol/L   TCO2 26 22 - 32 mmol/L   Hemoglobin 10.2 (L) 12.0 - 15.0 g/dL   HCT 30.0 (L) 36.0 - 46.0 %  I-STAT 7, (LYTES, BLD GAS, ICA, H+H)     Status: Abnormal   Collection Time: 04/29/20  1:57 PM  Result Value Ref Range   pH, Arterial 7.382 7.350 - 7.450   pCO2 arterial 45.4 32.0 - 48.0 mmHg   pO2, Arterial 197 (H) 83.0 - 108.0 mmHg   Bicarbonate 27.2 20.0 - 28.0 mmol/L   TCO2 29 22 - 32 mmol/L   O2 Saturation 100.0 %   Acid-Base Excess 2.0 0.0 - 2.0 mmol/L   Sodium 140 135 - 145 mmol/L   Potassium 3.5 3.5 - 5.1 mmol/L   Calcium, Ion 1.03 (L) 1.15 - 1.40 mmol/L   HCT 31.0 (L) 36.0 - 46.0 %   Hemoglobin 10.5 (L) 12.0 - 15.0 g/dL   Patient temperature 36.3 C    Collection site art line    Drawn by Operator    Sample type ARTERIAL   Glucose, capillary     Status: Abnormal   Collection Time: 04/29/20  1:59 PM  Result Value Ref Range   Glucose-Capillary 115 (H) 70 - 99 mg/dL    Comment: Glucose reference range applies only to samples taken after fasting for at least 8 hours.  CBC     Status: Abnormal   Collection Time: 04/29/20  2:12 PM  Result Value Ref Range   WBC 13.0 (H) 4.0 - 10.5 K/uL   RBC 3.50 (L) 3.87 - 5.11 MIL/uL   Hemoglobin 10.3 (L) 12.0 - 15.0 g/dL    Comment: REPEATED TO VERIFY POST TRANSFUSION SPECIMEN    HCT 30.8 (L) 36.0 - 46.0 %   MCV 88.0 80.0 - 100.0 fL   MCH 29.4  26.0 - 34.0 pg   MCHC 33.4 30.0 - 36.0 g/dL   RDW 14.3 11.5 - 15.5 %   Platelets 169 150 - 400 K/uL   nRBC 0.0 0.0 - 0.2 %    Comment: Performed at [facility], [place] [address] [place], [place] [address]  Protime-INR     Status: Abnormal   Collection Time: 04/29/20  2:12 PM  Result Value Ref Range   Prothrombin Time 25.6 (H) 11.4 - 15.2 seconds   INR 2.3 (H) 0.8 - 1.2    Comment: (NOTE) INR goal varies based on device and disease states. Performed at [facility], [place] [address], [place], [place] [address]   APTT     Status: None   Collection Time: 04/29/20  2:12 PM  Result Value Ref Range   aPTT 32 24 - 36 seconds    Comment: Performed at [facility] [address] [place], [place] [address]  Glucose, capillary     Status: None   Collection Time: 04/29/20  3:02 PM  Result Value Ref Range   Glucose-Capillary 83 70 - 99 mg/dL    Comment: Glucose reference range applies only to samples taken after fasting for at least 8 hours.  Glucose, capillary     Status: Abnormal   Collection Time: 04/29/20  4:04 PM  Result Value Ref Range   Glucose-Capillary 158 (H) 70 - 99 mg/dL    Comment: Glucose reference range applies only to samples taken after fasting for at least 8 hours.  Glucose, capillary     Status: Abnormal   Collection Time: 04/29/20  5:07 PM  Result Value Ref Range   Glucose-Capillary 189 (H) 70 - 99 mg/dL    Comment: Glucose reference range applies only to samples taken after fasting for at least 8 hours.  Glucose, capillary     Status: Abnormal   Collection Time: 04/29/20  5:58 PM  Result Value Ref Range   Glucose-Capillary 167 (H) 70 - 99 mg/dL    Comment: Glucose reference range applies only to samples taken after fasting for at least 8 hours.  Glucose, capillary     Status: Abnormal   Collection Time: 04/29/20  7:03 PM  Result Value Ref Range   Glucose-Capillary 160 (H) 70 - 99 mg/dL    Comment: Glucose reference range applies only to samples  taken after fasting for at least 8 hours.  Glucose, capillary     Status: Abnormal   Collection Time: 04/29/20  8:13 PM  Result Value Ref Range   Glucose-Capillary 157 (H) 70 - 99 mg/dL    Comment: Glucose reference range applies only to samples taken after fasting for at least 8 hours.  CBC     Status: Abnormal   Collection Time: 04/29/20  8:16 PM  Result Value Ref Range   WBC 12.9 (H) 4.0 - 10.5 K/uL   RBC 3.13 (L) 3.87 - 5.11 MIL/uL   Hemoglobin 9.2 (L) 12.0 - 15.0 g/dL   HCT 28.1 (L) 36.0 - 46.0 %   MCV 89.8 80.0 - 100.0 fL   MCH 29.4 26.0 - 34.0 pg   MCHC 32.7 30.0 - 36.0 g/dL   RDW 15.0 11.5 - 15.5 %   Platelets 166 150 - 400 K/uL   nRBC 0.0 0.0 - 0.2 %    Comment: Performed at [facility], [place] [address] [place], [place] [address]  Basic metabolic panel     Status: Abnormal   Collection Time: 04/29/20  8:16 PM  Result Value Ref Range   Sodium 139 135 - 145 mmol/L   Potassium 4.3 3.5 - 5.1 mmol/L   Chloride 111 98 - 111 mmol/L   CO2 23 22 - 32 mmol/L   Glucose, Bld 160 (H) 70 - 99 mg/dL    Comment: Glucose reference range applies only to samples taken after fasting for at least 8 hours.   BUN 16 6 - 20 mg/dL   Creatinine, Ser 1.41 (H) 0.44 - 1.00 mg/dL   Calcium 7.2 (L) 8.9 - 10.3 mg/dL   GFR, Estimated 45 (L) >60 mL/min    Comment: (NOTE) Calculated using the CKD-EPI Creatinine Equation (2021)    Anion gap 5 5 - 15    Comment: Performed at [facility] [address] [place], [place] [address]  Magnesium     Status: Abnormal   Collection Time: 04/29/20  8:16 PM  Result Value Ref Range   Magnesium 3.5 (H) 1.7 - 2.4 mg/dL    Comment: Performed at [facility] [address], [place], [place] [address]  Glucose, capillary     Status: Abnormal   Collection Time: 04/29/20  9:13 PM  Result Value Ref Range   Glucose-Capillary 163 (H) 70 - 99 mg/dL    Comment: Glucose reference range applies only to samples taken after fasting for at least 8 hours.   Glucose, capillary     Status: Abnormal   Collection Time: 04/29/20 10:08 PM  Result Value Ref Range   Glucose-Capillary 189 (H) 70 - 99 mg/dL    Comment: Glucose reference range applies only to samples taken after fasting for at least 8 hours.  Glucose, capillary     Status: Abnormal   Collection Time: 04/29/20 10:58 PM  Result Value Ref Range   Glucose-Capillary  186 (H) 70 - 99 mg/dL    Comment: Glucose reference range applies only to samples taken after fasting for at least 8 hours.  I-STAT 7, (LYTES, BLD GAS, ICA, H+H)     Status: Abnormal   Collection Time: 04/29/20 11:08 PM  Result Value Ref Range   pH, Arterial 7.276 (L) 7.350 - 7.450   pCO2 arterial 47.5 32.0 - 48.0 mmHg   pO2, Arterial 97 83.0 - 108.0 mmHg   Bicarbonate 22.1 20.0 - 28.0 mmol/L   TCO2 24 22 - 32 mmol/L   O2 Saturation 96.0 %   Acid-base deficit 5.0 (H) 0.0 - 2.0 mmol/L   Sodium 143 135 - 145 mmol/L   Potassium 3.9 3.5 - 5.1 mmol/L   Calcium, Ion 1.04 (L) 1.15 - 1.40 mmol/L   HCT 27.0 (L) 36.0 - 46.0 %   Hemoglobin 9.2 (L) 12.0 - 15.0 g/dL   Sample type ARTERIAL   Glucose, capillary     Status: Abnormal   Collection Time: 04/29/20 11:51 PM  Result Value Ref Range   Glucose-Capillary 185 (H) 70 - 99 mg/dL    Comment: Glucose reference range applies only to samples taken after fasting for at least 8 hours.  Glucose, capillary     Status: Abnormal   Collection Time: 04/30/20  1:01 AM  Result Value Ref Range   Glucose-Capillary 164 (H) 70 - 99 mg/dL    Comment: Glucose reference range applies only to samples taken after fasting for at least 8 hours.  Glucose, capillary     Status: Abnormal   Collection Time: 04/30/20  1:58 AM  Result Value Ref Range   Glucose-Capillary 154 (H) 70 - 99 mg/dL    Comment: Glucose reference range applies only to samples taken after fasting for at least 8 hours.  Glucose, capillary     Status: Abnormal   Collection Time: 04/30/20  3:17 AM  Result Value Ref Range    Glucose-Capillary 147 (H) 70 - 99 mg/dL    Comment: Glucose reference range applies only to samples taken after fasting for at least 8 hours.  Glucose, capillary     Status: Abnormal   Collection Time: 04/30/20  4:04 AM  Result Value Ref Range   Glucose-Capillary 149 (H) 70 - 99 mg/dL    Comment: Glucose reference range applies only to samples taken after fasting for at least 8 hours.  I-STAT 7, (LYTES, BLD GAS, ICA, H+H)     Status: Abnormal   Collection Time: 04/30/20  4:17 AM  Result Value Ref Range   pH, Arterial 7.353 7.350 - 7.450   pCO2 arterial 40.1 32.0 - 48.0 mmHg   pO2, Arterial 68 (L) 83.0 - 108.0 mmHg   Bicarbonate 22.3 20.0 - 28.0 mmol/L   TCO2 23 22 - 32 mmol/L   O2 Saturation 92.0 %   Acid-base deficit 3.0 (H) 0.0 - 2.0 mmol/L   Sodium 143 135 - 145 mmol/L   Potassium 3.6 3.5 - 5.1 mmol/L   Calcium, Ion 1.08 (L) 1.15 - 1.40 mmol/L   HCT 27.0 (L) 36.0 - 46.0 %   Hemoglobin 9.2 (L) 12.0 - 15.0 g/dL   Sample type ARTERIAL   CBC     Status: Abnormal   Collection Time: 04/30/20  4:20 AM  Result Value Ref Range   WBC 16.2 (H) 4.0 - 10.5 K/uL   RBC 2.96 (L) 3.87 - 5.11 MIL/uL   Hemoglobin 8.7 (L) 12.0 - 15.0 g/dL   HCT 26.5 (L) 36.0 -  46.0 %   MCV 89.5 80.0 - 100.0 fL   MCH 29.4 26.0 - 34.0 pg   MCHC 32.8 30.0 - 36.0 g/dL   RDW 15.2 11.5 - 15.5 %   Platelets 164 150 - 400 K/uL   nRBC 0.0 0.0 - 0.2 %    Comment: Performed at [facility], [place] [address], [place], [place] [address]  Basic metabolic panel     Status: Abnormal   Collection Time: 04/30/20  4:20 AM  Result Value Ref Range   Sodium 140 135 - 145 mmol/L   Potassium 3.7 3.5 - 5.1 mmol/L   Chloride 110 98 - 111 mmol/L   CO2 22 22 - 32 mmol/L   Glucose, Bld 146 (H) 70 - 99 mg/dL    Comment: Glucose reference range applies only to samples taken after fasting for at least 8 hours.   BUN 19 6 - 20 mg/dL   Creatinine, Ser 1.75 (H) 0.44 - 1.00 mg/dL   Calcium 7.7 (L) 8.9 - 10.3 mg/dL   GFR, Estimated  34 (L) >60 mL/min    Comment: (NOTE) Calculated using the CKD-EPI Creatinine Equation (2021)    Anion gap 8 5 - 15    Comment: Performed at [facility] [address] [place], [place] [address]  Magnesium     Status: Abnormal   Collection Time: 04/30/20  4:20 AM  Result Value Ref Range   Magnesium 3.2 (H) 1.7 - 2.4 mg/dL    Comment: Performed at [facility] [address], [place], [place] [address]  Glucose, capillary     Status: Abnormal   Collection Time: 04/30/20  5:24 AM  Result Value Ref Range   Glucose-Capillary 170 (H) 70 - 99 mg/dL    Comment: Glucose reference range applies only to samples taken after fasting for at least 8 hours.  Glucose, capillary     Status: Abnormal   Collection Time: 04/30/20  6:08 AM  Result Value Ref Range   Glucose-Capillary 141 (H) 70 - 99 mg/dL    Comment: Glucose reference range applies only to samples taken after fasting for at least 8 hours.  I-STAT 7, (LYTES, BLD GAS, ICA, H+H)     Status: Abnormal   Collection Time: 04/30/20  6:10 AM  Result Value Ref Range   pH, Arterial 7.330 (L) 7.350 - 7.450   pCO2 arterial 40.7 32.0 - 48.0 mmHg   pO2, Arterial 62 (L) 83.0 - 108.0 mmHg   Bicarbonate 21.5 20.0 - 28.0 mmol/L   TCO2 23 22 - 32 mmol/L   O2 Saturation 90.0 %   Acid-base deficit 4.0 (H) 0.0 - 2.0 mmol/L   Sodium 142 135 - 145 mmol/L   Potassium 3.8 3.5 - 5.1 mmol/L   Calcium, Ion 1.08 (L) 1.15 - 1.40 mmol/L   HCT 28.0 (L) 36.0 - 46.0 %   Hemoglobin 9.5 (L) 12.0 - 15.0 g/dL   Sample type ARTERIAL   Glucose, capillary     Status: Abnormal   Collection Time: 04/30/20  7:20 AM  Result Value Ref Range   Glucose-Capillary 156 (H) 70 - 99 mg/dL    Comment: Glucose reference range applies only to samples taken after fasting for at least 8 hours.  .Cooxemetry Panel (carboxy, met, total hgb, O2 sat)     Status: Abnormal   Collection Time: 04/30/20  9:38 AM  Result Value Ref Range   Total hemoglobin 8.1 (L) 12.0 - 16.0 g/dL  O2 Saturation 43.3 %   Carboxyhemoglobin 0.5 0.5 - 1.5 %   Methemoglobin 1.0 0.0 - 1.5 %    Comment: Performed at [facility] [address], [place], [place] [address]  Glucose, capillary     Status: Abnormal   Collection Time: 04/30/20  9:49 AM  Result Value Ref Range   Glucose-Capillary 161 (H) 70 - 99 mg/dL    Comment: Glucose reference range applies only to samples taken after fasting for at least 8 hours.  Glucose, capillary     Status: Abnormal   Collection Time: 04/30/20 11:05 AM  Result Value Ref Range   Glucose-Capillary 140 (H) 70 - 99 mg/dL    Comment: Glucose reference range applies only to samples taken after fasting for at least 8 hours.  Glucose, capillary     Status: Abnormal   Collection Time: 04/30/20 12:06 PM  Result Value Ref Range   Glucose-Capillary 124 (H) 70 - 99 mg/dL    Comment: Glucose reference range applies only to samples taken after fasting for at least 8 hours.  Glucose, capillary     Status: Abnormal   Collection Time: 04/30/20  3:54 PM  Result Value Ref Range   Glucose-Capillary 182 (H) 70 - 99 mg/dL    Comment: Glucose reference range applies only to samples taken after fasting for at least 8 hours.  Basic metabolic panel     Status: Abnormal   Collection Time: 04/30/20  4:54 PM  Result Value Ref Range   Sodium 134 (L) 135 - 145 mmol/L   Potassium 3.9 3.5 - 5.1 mmol/L   Chloride 104 98 - 111 mmol/L   CO2 22 22 - 32 mmol/L   Glucose, Bld 193 (H) 70 - 99 mg/dL    Comment: Glucose reference range applies only to samples taken after fasting for at least 8 hours.   BUN 26 (H) 6 - 20 mg/dL   Creatinine, Ser 2.66 (H) 0.44 - 1.00 mg/dL   Calcium 8.1 (L) 8.9 - 10.3 mg/dL   GFR, Estimated 21 (L) >60 mL/min    Comment: (NOTE) Calculated using the CKD-EPI Creatinine Equation (2021)    Anion gap 8 5 - 15    Comment: Performed at [facility] [address], [place],  [address]  Magnesium     Status: Abnormal   Collection Time:  04/30/20  4:54 PM  Result Value Ref Range   Magnesium 2.9 (H) 1.7 - 2.4 mg/dL    Comment: Performed at [facility] [address], [place], [place] [address]  CBC     Status: Abnormal   Collection Time: 04/30/20  4:54 PM  Result Value Ref Range   WBC 19.1 (H) 4.0 - 10.5 K/uL   RBC 2.82 (L) 3.87 - 5.11 MIL/uL   Hemoglobin 8.4 (L) 12.0 - 15.0 g/dL   HCT 25.8 (L) 36.0 - 46.0 %   MCV 91.5 80.0 - 100.0 fL   MCH 29.8 26.0 - 34.0 pg   MCHC 32.6 30.0 - 36.0 g/dL   RDW 15.2 11.5 - 15.5 %   Platelets 151 150 - 400 K/uL   nRBC 0.2 0.0 - 0.2 %    Comment: Performed at [facility], [place] [address] [place], [place] [address]  Glucose, capillary     Status: Abnormal   Collection Time: 04/30/20  8:04 PM  Result Value Ref Range   Glucose-Capillary 188 (H) 70 - 99 mg/dL    Comment: Glucose  reference range applies only to samples taken after fasting for at least 8 hours.  Blood gas, venous     Status: Abnormal   Collection Time: 04/30/20  8:13 PM  Result Value Ref Range   FIO2 21.00    pH, Ven 7.263 7.250 - 7.430   pCO2, Ven 43.8 (L) 44.0 - 60.0 mmHg   pO2, Ven 42.9 32.0 - 45.0 mmHg   Bicarbonate 19.1 (L) 20.0 - 28.0 mmol/L   Acid-base deficit 6.6 (H) 0.0 - 2.0 mmol/L   O2 Saturation 67.7 %   Patient temperature 37.0    Drawn by 1444    Sample type VENOUS     Comment: Performed at [facility] [address] [place], [place] [address]  I-STAT 7, (LYTES, BLD GAS, ICA, H+H)     Status: Abnormal   Collection Time: 04/30/20  8:26 PM  Result Value Ref Range   pH, Arterial 7.291 (L) 7.350 - 7.450   pCO2 arterial 39.5 32.0 - 48.0 mmHg   pO2, Arterial 52 (L) 83.0 - 108.0 mmHg   Bicarbonate 19.1 (L) 20.0 - 28.0 mmol/L   TCO2 20 (L) 22 - 32 mmol/L   O2 Saturation 83.0 %   Acid-base deficit 7.0 (H) 0.0 - 2.0 mmol/L   Sodium 134 (L) 135 - 145 mmol/L   Potassium 3.8 3.5 - 5.1 mmol/L   Calcium, Ion 1.14 (L) 1.15 - 1.40 mmol/L   HCT 23.0 (L) 36.0 - 46.0 %   Hemoglobin 7.8 (L)  12.0 - 15.0 g/dL   Patient temperature 97.8 F    Collection site Brachial    Drawn by RT    Sample type ARTERIAL   Prepare RBC (crossmatch)     Status: None   Collection Time: 04/30/20  8:53 PM  Result Value Ref Range   Order Confirmation      ORDER PROCESSED BY BLOOD BANK BB SAMPLE OR UNITS ALREADY AVAILABLE Performed at [facility], [address], [place], [place] [address]   I-STAT 7, (LYTES, BLD GAS, ICA, H+H)     Status: Abnormal   Collection Time: 04/30/20 10:01 PM  Result Value Ref Range   pH, Arterial 7.341 (L) 7.350 - 7.450   pCO2 arterial 42.2 32.0 - 48.0 mmHg   pO2, Arterial 88 83.0 - 108.0 mmHg   Bicarbonate 22.9 20.0 - 28.0 mmol/L   TCO2 24 22 - 32 mmol/L   O2 Saturation 96.0 %   Acid-base deficit 3.0 (H) 0.0 - 2.0 mmol/L   Sodium 136 135 - 145 mmol/L   Potassium 3.8 3.5 - 5.1 mmol/L   Calcium, Ion 1.10 (L) 1.15 - 1.40 mmol/L   HCT 23.0 (L) 36.0 - 46.0 %   Hemoglobin 7.8 (L) 12.0 - 15.0 g/dL   Patient temperature 97.8 F    Sample type ARTERIAL   Glucose, capillary     Status: Abnormal   Collection Time: 05/01/20 12:01 AM  Result Value Ref Range   Glucose-Capillary 154 (H) 70 - 99 mg/dL    Comment: Glucose reference range applies only to samples taken after fasting for at least 8 hours.  Protime-INR     Status: Abnormal   Collection Time: 05/01/20  3:28 AM  Result Value Ref Range   Prothrombin Time 23.9 (H) 11.4 - 15.2 seconds   INR 2.1 (H) 0.8 - 1.2    Comment: (NOTE) INR goal varies based on device and disease states. Performed at [facility], [place] [address],  [place], [place] [address]   Basic metabolic panel     Status: Abnormal   Collection Time: 05/01/20  3:28 AM  Result Value Ref Range   Sodium 135 135 - 145 mmol/L   Potassium 3.7 3.5 - 5.1 mmol/L   Chloride 100 98 - 111 mmol/L   CO2 23 22 - 32 mmol/L   Glucose, Bld 167 (H) 70 - 99 mg/dL    Comment: Glucose reference range applies only to samples taken after fasting for at least 8  hours.   BUN 33 (H) 6 - 20 mg/dL   Creatinine, Ser 3.38 (H) 0.44 - 1.00 mg/dL   Calcium 8.4 (L) 8.9 - 10.3 mg/dL   GFR, Estimated 16 (L) >60 mL/min    Comment: (NOTE) Calculated using the CKD-EPI Creatinine Equation (2021)    Anion gap 12 5 - 15    Comment: Performed at [facility] [address] [place], [place] [address]  CBC     Status: Abnormal   Collection Time: 05/01/20  3:28 AM  Result Value Ref Range   WBC 18.2 (H) 4.0 - 10.5 K/uL   RBC 3.16 (L) 3.87 - 5.11 MIL/uL   Hemoglobin 9.2 (L) 12.0 - 15.0 g/dL   HCT 27.6 (L) 36.0 - 46.0 %   MCV 87.3 80.0 - 100.0 fL   MCH 29.1 26.0 - 34.0 pg   MCHC 33.3 30.0 - 36.0 g/dL   RDW 14.6 11.5 - 15.5 %   Platelets 145 (L) 150 - 400 K/uL   nRBC 0.2 0.0 - 0.2 %    Comment: Performed at [facility], [place] [address] [place], [place] [address]  Glucose, capillary     Status: Abnormal   Collection Time: 05/01/20  3:28 AM  Result Value Ref Range   Glucose-Capillary 168 (H) 70 - 99 mg/dL    Comment: Glucose reference range applies only to samples taken after fasting for at least 8 hours.  .Cooxemetry Panel (carboxy, met, total hgb, O2 sat)     Status: Abnormal   Collection Time: 05/01/20  5:16 AM  Result Value Ref Range   Total hemoglobin 9.1 (L) 12.0 - 16.0 g/dL   O2 Saturation 59.0 %   Carboxyhemoglobin 0.6 0.5 - 1.5 %   Methemoglobin 1.4 0.0 - 1.5 %    Comment: Performed at [facility] [address] [place], [place] [address]  Glucose, capillary     Status: Abnormal   Collection Time: 05/01/20  8:09 AM  Result Value Ref Range   Glucose-Capillary 161 (H) 70 - 99 mg/dL    Comment: Glucose reference range applies only to samples taken after fasting for at least 8 hours.  Glucose, capillary     Status: Abnormal   Collection Time: 05/01/20 11:18 AM  Result Value Ref Range   Glucose-Capillary 148 (H) 70 - 99 mg/dL    Comment: Glucose reference range applies only to samples taken after fasting for at least 8 hours.    DG Chest Port 1 View  Result Date: 05/01/2020 CLINICAL DATA:  Respiratory distress EXAM: PORTABLE CHEST 1 VIEW COMPARISON:  This 04/30/2020 FINDINGS: Right internal jugular Cordis introducer remains, however, Swan-Ganz catheter has been removed. Mediastinal drain and left basilar chest tube are unchanged. Pulmonary insufflation is stable. Superimposed pulmonary edema has significantly improved, though mild perihilar interstitial edema persists. No pneumothorax or pleural effusion. Mitral valve replacement and left atrial clipping have been performed. Cardiac size within normal limits. IMPRESSION: Stable pulmonary insufflation.  Improving interstitial pulmonary edema. Left basilar chest tube in place.  No pneumothorax. Electronically Signed   By: Fidela Salisbury MD   On: 05/01/2020 06:30   DG Chest Port 1 View  Result Date: 04/30/2020 CLINICAL DATA:  Status post extubation EXAM: PORTABLE CHEST 1 VIEW COMPARISON:  April 29, 2020 FINDINGS: Endotracheal tube and nasogastric tube have been removed. Swan-Ganz catheter tip is there is a small right pleural effusion with equivocal left pleural effusion. There is atelectatic change in the left mid lung. Heart is mildly enlarged with pulmonary vascular congestion. There is aortic atherosclerosis. Patient is status post mitral valve replacement. There is a left atrial appendage clamp. No bone lesions. There are IMPRESSION: Tube and catheter positions as described without pneumothorax. There is mild cardiomegaly with pulmonary vascular congestion. Small right pleural effusion with equivocal left pleural effusion. Left midlung atelectasis. No consolidation. Stable cardiac silhouette with postoperative changes. Aortic Atherosclerosis (ICD10-I70.0). Electronically Signed   By: Lowella Grip III M.D.   On: 04/30/2020 08:16   DG Chest Port 1 View  Result Date: 04/29/2020 CLINICAL DATA:  Status post mitral valve surgery EXAM: PORTABLE CHEST 1 VIEW COMPARISON:   04/25/2020 FINDINGS: Endotracheal tube is noted at the level of the carina directed towards right mainstem bronchus. Gastric catheter is noted coiled upon itself in the distal esophagus with the tip in the pharynx. This should be withdrawn and readvanced. Swan-Ganz catheter is noted in the pulmonary outflow tract. Changes of mitral valve replacement and left atrial appendage clipping are seen. Vascular congestion is noted without significant edema. No pneumothorax is noted. Mediastinal drain and left thoracostomy catheter as well as a pericardial drain are seen. IMPRESSION: Tubes and lines as described above. The gastric catheter should be withdrawn completely and readvanced. Endotracheal tube is noted at the level of the carina directed towards the mainstem bronchus on the right. Increased vascular congestion without significant edema. These results will be called to the ordering clinician or representative by the Radiologist Assistant, and communication documented in the PACS or [employer]. Electronically Signed   By: Inez Catalina M.D.   On: 04/29/2020 14:17    PMH:   Past Medical History:  Diagnosis Date  . (HFpEF) heart failure with preserved ejection fraction ([place])    a. 08/2017 Echo: EF 55-60%.  Grade 2 diastolic dysfunction.  Moderate mitral stenosis.  . Allergy   . Anemia   . Anxiety   . BRCA negative 03/22/2013  . Bronchitis 02/19/2016   ON LEVAQUIN PO  . Chest tightness   . CHF (congestive heart failure) ([place])   . Cigarette smoker 09/11/2017   8-10 day  . Dyspnea   . Dysrhythmia   . GERD (gastroesophageal reflux disease)   . Heart murmur    Per pt. dx by Dr. Roxy Manns via [place]  . History of kidney stones   . Hyperlipidemia   . Hypertension   . Interstitial lung disease ([place])    a. CT 2013 b. 02/2018 CXR noted recurrent intersistial changes ILD vs chronic bronchitis  . Moderate mitral stenosis    a.  08/2017 TEE: EF 60 to 65%.  Moderate mitral stenosis.  Mean gradient 14  mmHg.  Valve area 2.59 cm by planimetry, 2.72 cm by pressure half-time.  . Persistent atrial fibrillation ([place])    a. 08/2017 s/p TEE/DCCV; b. CHA2DS2VASc = 2-->warfarin.  . Pneumonia   . S/P Maze operation for atrial fibrillation 04/29/2020   Complete bilateral atrial lesion set using bipolar radiofrequency and cryothermy  ablation with clipping of LA appendage  . S/P mitral valve replacement with Onyx bileaflet mechanical valve 04/29/2020   27/29 mm Onyx-Valve    PSH:   Past Surgical History:  Procedure Laterality Date  . ABDOMINAL HYSTERECTOMY     total  . ABDOMINAL HYSTERECTOMY    . BREAST BIOPSY Bilateral 2012  . BREAST BIOPSY Right 08-07-12   fibroadenomatous changes and columnar cells  . BREAST BIOPSY  02/03/2015   stereo byrnett  . BUBBLE STUDY  04/01/2020   Procedure: BUBBLE STUDY;  Surgeon: [name], MD;  Location: [facility];  Service: Cardiovascular;;  . CARDIAC CATHETERIZATION    . CHOLECYSTECTOMY N/A 02/27/2016   Procedure: LAPAROSCOPIC CHOLECYSTECTOMY;  Surgeon: Christene Lye, MD;  Location: [facility];  Service: General;  Laterality: N/A;  . CLIPPING OF ATRIAL APPENDAGE  04/29/2020   Procedure: CLIPPING OF ATRIAL APPENDAGE USING ATRICURE  PRO2 CLIP SIZE 45MM;  Surgeon: Rexene Alberts, MD;  Location: [facility] OR;  Service: Open Heart Surgery;;  . COLONOSCOPY WITH PROPOFOL N/A 09/26/2018   Procedure: COLONOSCOPY WITH PROPOFOL;  Surgeon: Lucilla Lame, MD;  Location[facility];  Service: Endoscopy;  Laterality: N/A;  . CYSTOSCOPY W/ RETROGRADES Right 08/18/2018   Procedure: CYSTOSCOPY WITH RETROGRADE PYELOGRAM;  Surgeon: Billey Co, MD;  Location: [facility];  Service: Urology;  Laterality: Right;  . CYSTOSCOPY/URETEROSCOPY/HOLMIUM LASER/STENT PLACEMENT Right 08/18/2018   Procedure: CYSTOSCOPY/URETEROSCOPY/STENT PLACEMENT;  Surgeon: Billey Co, MD;  Location: [facility];  Service: Urology;  Laterality: Right;  . DIAGNOSTIC LAPAROSCOPY    .  ESOPHAGOGASTRODUODENOSCOPY (EGD) WITH PROPOFOL N/A 09/26/2018   Procedure: ESOPHAGOGASTRODUODENOSCOPY (EGD) WITH PROPOFOL;  Surgeon: Lucilla Lame, MD;  Location: [facility] ENDOSCOPY;  Service: Endoscopy;  Laterality: N/A;  . GIVENS CAPSULE STUDY N/A 11/03/2018   Procedure: GIVENS CAPSULE STUDY;  Surgeon: Lucilla Lame, MD;  Location: [facility] ENDOSCOPY;  Service: Endoscopy;  Laterality: N/A;  . JOINT REPLACEMENT Left    knee  . KNEE ARTHROPLASTY Right 04/09/2019   Procedure: COMPUTER ASSISTED TOTAL KNEE ARTHROPLASTY;  Surgeon: Dereck Leep, MD;  Location: [facility];  Service: Orthopedics;  Laterality: Right;  . KNEE CLOSED REDUCTION Left 04/15/2015   Procedure: CLOSED MANIPULATION KNEE;  Surgeon: Thornton Park, MD;  Location: [facility];  Service: Orthopedics;  Laterality: Left;  . KNEE SURGERY    . MAZE N/A 04/29/2020   Procedure: MAZE;  Surgeon: Rexene Alberts, MD;  Location: [facility];  Service: Open Heart Surgery;  Laterality: N/A;  . MITRAL VALVE REPLACEMENT N/A 04/29/2020   Procedure: MITRAL VALVE (MV) REPLACEMENT USING ON-X VALVE SIZE 27/29MM;  Surgeon: Rexene Alberts, MD;  Location: [facility];  Service: Open Heart Surgery;  Laterality: N/A;  . MULTIPLE EXTRACTIONS WITH ALVEOLOPLASTY N/A 02/28/2020   Procedure: MULTIPLE EXTRACTION WITH ALVEOLOPLASTY;  Surgeon: [name], DMD;  Location: [facility];  Service: Dentistry;  Laterality: N/A;  . RIGHT/LEFT HEART CATH AND CORONARY ANGIOGRAPHY Bilateral 09/06/2019   Procedure: RIGHT/LEFT HEART CATH AND CORONARY ANGIOGRAPHY;  Surgeon: Minna Merritts, MD;  Location: [facility];  Service: Cardiovascular;  Laterality: Bilateral;  . TEE WITHOUT CARDIOVERSION N/A 09/13/2017   Procedure: TRANSESOPHAGEAL ECHOCARDIOGRAM (TEE);  Surgeon: Nelva Bush, MD;  Location: [facility];  Service: Cardiovascular;  Laterality: N/A;  . TEE WITHOUT CARDIOVERSION N/A 04/01/2020   Procedure: TRANSESOPHAGEAL ECHOCARDIOGRAM (TEE);  Surgeon: [name], MD;  Location:  [facility] ENDOSCOPY;  Service: Cardiovascular;  Laterality: N/A;  . TEE WITHOUT CARDIOVERSION N/A 04/29/2020   Procedure: TRANSESOPHAGEAL ECHOCARDIOGRAM (TEE);  Surgeon: Rexene Alberts, MD;  Location: [facility];  Service: Open Heart Surgery;  Laterality: N/A;  . TOTAL KNEE ARTHROPLASTY Left 12/25/2014   Procedure: TOTAL KNEE ARTHROPLASTY;  Surgeon: Thornton Park, MD;  Location: [facility];  Service: Orthopedics;  Laterality: Left;  . TUBAL LIGATION      Allergies:  Allergies  Allergen Reactions  . Morphine Hives and Rash  . Oxycodone Hcl Hives and Itching  . Dilaudid [Hydromorphone Hcl] Itching    Medications:   Prior to Admission medications   Medication Sig Start Date End Date Taking? Authorizing Provider  acetaminophen (TYLENOL) 500 MG tablet Take 1,000 mg by mouth every 6 (six) hours as needed for moderate pain.   Yes [provider]  ALPRAZolam (XANAX) 0.5 MG tablet TAKE 1 TABLET(0.5 MG) BY MOUTH AT BEDTIME AS NEEDED FOR ANXIETY Patient taking differently: Take 0.5 mg by mouth daily as needed for anxiety or sleep. 04/11/20  Yes Luiz Ochoa, NP  atorvastatin (LIPITOR) 80 MG tablet TAKE 1 TABLET BY MOUTH EVERY DAY AT 6 PM Patient taking differently: Take 80 mg by mouth every evening. 12/12/19  Yes Loel Dubonnet, NP  calcium carbonate (TUMS - DOSED IN MG ELEMENTAL CALCIUM) 500 MG chewable tablet Chew 500 mg by mouth daily as needed for indigestion or heartburn.   Yes [provider]  diltiazem (CARDIZEM CD) 120 MG 24 hr capsule TAKE 1 CAPSULE(120 MG) BY MOUTH DAILY Patient taking differently: Take 120 mg by mouth daily. 02/01/20  Yes Luiz Ochoa, NP  fluticasone (FLONASE) 50 MCG/ACT nasal spray Place 1 spray into both nostrils daily as needed for rhinitis.   Yes [provider]  metoprolol succinate (TOPROL-XL) 50 MG 24 hr tablet TAKE 1 TABLET BY MOUTH EVERY MORNING AND 2 TABLETS BY MOUTH EVERY NIGHT AT BEDTIME Patient taking differently: Take 50-100 mg by  mouth See admin instructions. Take 1 tablet (50 mg) by mouth in the morning and take 2 tablets (100 mg) at bedtime 12/12/19  Yes Loel Dubonnet, NP  omeprazole (PRILOSEC) 40 MG capsule Take 1 capsule (40 mg total) by mouth daily. 12/06/19  Yes Boscia, Greer Ee, NP  potassium chloride (KLOR-CON) 10 MEQ tablet Take 30 mg in the morning and 20 mg at bedtime Patient taking differently: Take 20-30 mEq by mouth See admin instructions. Take 3 tablets (30 meq) by mouth in the morning & take 20 meq by mouth in the evening 03/10/20  Yes Loel Dubonnet, NP  torsemide (DEMADEX) 20 MG tablet TAKE 2 TABLETS(40 MG) BY MOUTH TWICE DAILY Patient taking differently: Take 40 mg by mouth in the morning and at bedtime. 03/03/20  Yes Minna Merritts, MD  warfarin (COUMADIN) 2.5 MG tablet TAKE AS DIRECTED BY ANTI-COAG CLINIC Patient taking differently: Take 1.25 mg by mouth daily at 4 PM. 12/19/19  Yes Gollan, Kathlene November, MD  albuterol (VENTOLIN HFA) 108 (90 Base) MCG/ACT inhaler Inhale 2 puffs into the lungs every 6 (six) hours as needed for wheezing or shortness of breath. 01/25/20   Luiz Ochoa, NP  amoxicillin (AMOXIL) 500 MG tablet Take 2,000 mg by mouth See admin instructions. Take 4 capsules (2000 mg) by mouth 1 hour prior to dental work 03/06/20   [provider]  chlorpheniramine-HYDROcodone (TUSSIONEX PENNKINETIC ER) 10-8 MG/5ML SUER Take 5 mLs by mouth at bedtime as needed for cough. Patient not taking: No sig reported 04/03/20   Luiz Ochoa, NP  enoxaparin (LOVENOX) 120 MG/0.8ML injection Inject 0.74 mLs (  111 mg total) into the skin daily for 4 days. 04/24/20 04/28/20  Rexene Alberts, MD    Discontinued Meds:   Medications Discontinued During This Encounter  Medication Reason  . Surgifoam 1 Gm with 0.9% sodium chloride (4 ml) topical solution Patient Discharge  . heparin sodium (porcine) 2,500 Units, papaverine 30 mg in electrolyte-148 (PLASMALYTE-148) 500 mL irrigation Patient Discharge   . vancomycin (VANCOCIN) 1,000 mg in sodium chloride 0.9 % 1,000 mL irrigation Patient Discharge  . sodium chloride irrigation 0.9 % Patient Discharge  . insulin regular, human (MYXREDLIN) 100 units/ 100 mL infusion   . EPINEPHrine (ADRENALIN) 4 mg in NS 250 mL (0.016 mg/mL) premix infusion   . nitroGLYCERIN 50 mg in dextrose 5 % 250 mL (0.2 mg/mL) infusion   . norepinephrine (LEVOPHED) 33m in 2551mpremix infusion   . phenylephrine (NEOSYNEPHRINE) 20-0.9 MG/250ML-% infusion   . potassium chloride injection 80 mEq   . heparin 30,000 units/NS 1000 mL solution for CELLSAVER   . heparin sodium (porcine) 2,500 Units, papaverine 30 mg in electrolyte-148 (PLASMALYTE-148) 500 mL irrigation   . tranexamic acid (CYKLOKAPRON) pump prime solution 223 mg   . vancomycin (VANCOCIN) 1,000 mg in sodium chloride 0.9 % 1,000 mL irrigation   . [place] Blood Cardioplegia vial (lidocaine/magnesium/mannitol 0.26g-4g-6.4g)   . metoprolol tartrate (LOPRESSOR) tablet 12.5 mg   . chlorhexidine (HIBICLENS) 4 % liquid 2 application   . dexmedetomidine (PRECEDEX) 400 MCG/100ML (4 mcg/mL) infusion Patient Transfer  . oxyCODONE (Oxy IR/ROXICODONE) immediate release tablet 5-10 mg Allergic reaction  . pantoprazole (PROTONIX) EC tablet 40 mg   . albuterol (VENTOLIN HFA) 108 (90 Base) MCG/ACT inhaler 2 puff Patient has not taken in last 30 days  . ALPRAZolam (XANAX) tablet 0.5 mg   . 0.45 % sodium chloride infusion   . 0.9 %  sodium chloride infusion   . lactated ringers infusion 500 mL   . acetaminophen (TYLENOL) 160 MG/5ML solution 1,000 mg   . midazolam (VERSED) injection 2 mg   . aspirin chewable tablet 324 mg   . 0.9 %  sodium chloride infusion   . atorvastatin (LIPITOR) tablet 80 mg   . nitroGLYCERIN 50 mg in dextrose 5 % 250 mL (0.2 mg/mL) infusion   . phenylephrine (NEOSYNEPHRINE) 20-0.9 MG/250ML-% infusion   . dexmedetomidine (PRECEDEX) 400 MCG/100ML (4 mcg/mL) infusion   . insulin regular, human  (MYXREDLIN) 100 units/ 100 mL infusion   . dextrose 50 % solution 0-50 mL   . metoCLOPramide (REGLAN) injection 10 mg   . warfarin (COUMADIN) tablet 2.5 mg   . enoxaparin (LOVENOX) injection 30 mg   . ferrous [id number]-folic acid (TRINSICON / FOLTRIN) capsule 1 capsule   . fluticasone (FLONASE) 50 MCG/ACT nasal spray 1 spray   . atorvastatin (LIPITOR) tablet 80 mg     Social History:  reports that she quit smoking about 2 years ago. Her smoking use included cigarettes. She has a 10.00 pack-year smoking history. She has never used smokeless tobacco. She reports current alcohol use. She reports that she does not use drugs.  Family History:   Family History  Problem Relation Age of Onset  . Cancer Mother [id number]     breast  . Hypertension Mother   . Cancer Maternal Aunt        breast  . Cancer Maternal Grandmother        breast  . Osteoarthritis Father   . Hypertension Father     Blood pressure (!) 119/92, pulse  100, temperature 99 F (37.2 C), temperature source Oral, resp. rate 18, height '5\' 4"'  (1.626 m), weight 115.7 kg, SpO2 95 %. General appearance: alert, cooperative and appears stated age, NCAT HEENT: No conjunctival pallor, EOMI NECK: Supple, no thyromegaly LUNGS: CTA B/L poor air movement, no  rhonchi or wheezing CV: RRR ABD: SNDNT +BS  EXT: Tr lower extremity edema         Shandora Koogler, Hunt Oris, MD 05/01/2020, 11:44 AM

## 2020-05-01 NOTE — Progress Notes (Addendum)
[facility] DAILY ICU PROGRESS NOTE                   [address]            [place] [address]          [phone]   2 Days Post-Op Procedure(s) (LRB): MITRAL VALVE (MV) REPLACEMENT USING ON-X VALVE SIZE 27/29MM (N/A) MAZE (N/A) TRANSESOPHAGEAL ECHOCARDIOGRAM (TEE) (N/A) CLIPPING OF ATRIAL APPENDAGE USING ATRICURE  PRO2 CLIP SIZE 45MM  Total Length of Stay:  LOS: 2 days   Subjective:  Patient in BIPAP this morning.  She admits to feeling short of breath.  Has some pain.  Objective: Vital signs in last 24 hours: Temp:  [97 F (36.1 C)-99.86 F (37.7 C)] 99 F (37.2 C) (04/14 0130) Pulse Rate:  [67-104] 100 (04/14 0700) Cardiac Rhythm: Normal sinus rhythm (04/14 0400) Resp:  [11-27] 17 (04/14 0700) BP: (84-143)/(29-92) 119/92 (04/13 2200) SpO2:  [87 %-100 %] 95 % (04/14 0700) Arterial Line BP: (119-160)/(43-55) 139/49 (04/14 0700) FiO2 (%):  [40 %-100 %] 40 % (04/14 0500)  Filed Weights   04/29/20 0553 04/30/20 0520  Weight: 111.3 kg 115.7 kg    Weight change:    Hemodynamic parameters for last 24 hours: PAP: (61-71)/(24-36) 71/36  Intake/Output from previous day: 04/13 0701 - 04/14 0700 In: 1444.3 [P.O.:240; I.V.:511.2; Blood:315; IV Piggyback:378] Out: 469 [Urine:403; Chest Tube:440]  Current Meds: Scheduled Meds: . acetaminophen  1,000 mg Oral Q6H  . [START ON 05/03/2020] atorvastatin  80 mg Oral QPM  . bisacodyl  10 mg Oral Daily   Or  . bisacodyl  10 mg Rectal Daily  . Chlorhexidine Gluconate Cloth  6 each Topical Daily  . docusate sodium  200 mg Oral Daily  . insulin aspart  0-24 Units Subcutaneous Q4H  . mouth rinse  15 mL Mouth Rinse BID  . pantoprazole  40 mg Oral Daily  . sodium chloride flush  10-40 mL Intracatheter Q12H  . sodium chloride flush  3 mL Intravenous Q12H  . Warfarin - Physician Dosing Inpatient   Does not apply q1600   Continuous Infusions: . sodium chloride    . sodium chloride    . DOPamine 3 mcg/kg/min (05/01/20  0700)  . furosemide (LASIX) 200 mg in dextrose 5% 100 mL (2mg /mL) infusion 15 mg/hr (05/01/20 0700)  . lactated ringers    . lactated ringers    . milrinone 0.5 mcg/kg/min (05/01/20 0700)   PRN Meds:.Place/Maintain arterial line **AND** sodium chloride, fentaNYL (SUBLIMAZE) injection, fluticasone, ondansetron (ZOFRAN) IV, sodium chloride flush, sodium chloride flush, traMADol  General appearance: alert, cooperative and no distress Heart: regular rate and rhythm Lungs: clear to auscultation bilaterally Abdomen: soft, non-tender; bowel sounds normal; no masses,  no organomegaly Extremities: edema Mild pitting Wound: aquacel in place  Lab Results: CBC: Recent Labs    04/30/20 1654 04/30/20 2026 04/30/20 2201 05/01/20 0328  WBC 19.1*  --   --  18.2*  HGB 8.4*   < > 7.8* 9.2*  HCT 25.8*   < > 23.0* 27.6*  PLT 151  --   --  145*   < > = values in this interval not displayed.   BMET:  Recent Labs    04/30/20 1654 04/30/20 2026 04/30/20 2201 05/01/20 0328  NA 134*   < > 136 135  K 3.9   < > 3.8 3.7  CL 104  --   --  100  CO2 22  --   --  23  GLUCOSE 193*  --   --  167*  BUN 26*  --   --  33*  CREATININE 2.66*  --   --  3.38*  CALCIUM 8.1*  --   --  8.4*   < > = values in this interval not displayed.    CMET: Lab Results  Component Value Date   WBC 18.2 (H) 05/01/2020   HGB 9.2 (L) 05/01/2020   HCT 27.6 (L) 05/01/2020   PLT 145 (L) 05/01/2020   GLUCOSE 167 (H) 05/01/2020   CHOL 204 (H) 03/07/2020   TRIG 224 (H) 03/07/2020   HDL 35 (L) 03/07/2020   LDLCALC 129 (H) 03/07/2020   ALT 31 04/25/2020   AST 34 04/25/2020   NA 135 05/01/2020   K 3.7 05/01/2020   CL 100 05/01/2020   CREATININE 3.38 (H) 05/01/2020   BUN 33 (H) 05/01/2020   CO2 23 05/01/2020   TSH 0.250 (L) 03/07/2020   INR 2.1 (H) 05/01/2020   HGBA1C 6.5 (H) 04/25/2020      PT/INR:  Recent Labs    05/01/20 0328  LABPROT 23.9*  INR 2.1*   Radiology: [facility] Chest Port 1 View  Result Date:  05/01/2020 CLINICAL DATA:  Respiratory distress EXAM: PORTABLE CHEST 1 VIEW COMPARISON:  This 04/30/2020 FINDINGS: Right internal jugular Cordis introducer remains, however, Swan-Ganz catheter has been removed. Mediastinal drain and left basilar chest tube are unchanged. Pulmonary insufflation is stable. Superimposed pulmonary edema has significantly improved, though mild perihilar interstitial edema persists. No pneumothorax or pleural effusion. Mitral valve replacement and left atrial clipping have been performed. Cardiac size within normal limits. IMPRESSION: Stable pulmonary insufflation. Improving interstitial pulmonary edema. Left basilar chest tube in place.  No pneumothorax. Electronically Signed   By: Fidela Salisbury MD   On: 05/01/2020 06:30    Assessment/Plan: S/P Procedure(s) (LRB): MITRAL VALVE (MV) REPLACEMENT USING ON-X VALVE SIZE 27/29MM (N/A) MAZE (N/A) TRANSESOPHAGEAL ECHOCARDIOGRAM (TEE) (N/A) CLIPPING OF ATRIAL APPENDAGE USING ATRICURE  PRO2 CLIP SIZE 45MM  1. CV- maintaining NSR, Milrinone increased yesterday 2. INR 2.1 3. Pulm- on BIPAP, CT output is low will remove today, continue pulmonary toilet 4. Renal- creatinine up to 3.38, due to Oliguric AKI- initiated on Dopamine, K is WNL, monitor closely.Marland Kitchen if continues to worsen will benefit from Nephrology consult 5. Expected post operative blood loss anemia- Hgb at 9.2 6. CBGs- controlled, continue SSIP 7. Dispo- patient with worsening renal function, oliguric, Dopamine initiated last evening, maintaining NSR, Milrinone dose increased last night, INR at 2.1 for mechanical MVR, will continue coumadin, monitor patients renal function closely, plans per Dr. Geraldo Docker, PA-C 05/01/2020 7:33 AM    I have seen and examined the patient and agree with the assessment and plan as outlined.   Remains in NSR w/ stable BP now on renal dose dopamine and milrinone, co-ox stable 59%.  Has developed acute oliguric renal failure  w/ creatinine up to 3.38 this morning.  Hypoxic overnight requiring initiation of BiPAP - currently breathing much better w/ BiPAP FiO2 40%.  CXR looks okay w/ mild acute on chronic diastolic CHF and bibasilar atelectasis.  Morbid obesity and post op chest discomfort contributing to poor inspiratory volumes.  Expected post op acute blood loss anemia w/ chronic iron-deficient anemia preop, Hgb up 9.2 this morning.  INR up 2.1 this morning.  Leukocytosis w/out fever, likely reactive, trending down.   Continue low dose dopamine and milrinone for now  Continue  BiPAP and keep NPO for now  Increase lasix dose  Consult Advanced Heart Failure, Nephrology and Pulm/CCM teams  D/C chest tubes and mobilize as much as possible  Hold Coumadin today and stop lovenox   Rexene Alberts, MD 05/01/2020 8:00 AM

## 2020-05-01 NOTE — Consult Note (Addendum)
Advanced Heart Failure Team Consult Note   Primary Physician: Lavera Guise, MD PCP-Cardiologist:  Ida Rogue, MD  Reason for Consultation: Heart Failure   HPI:    Leslie [name] is seen today for evaluation of heart failure  at the request of Dr Roxy Manns.  Leslie [name] is a 55 year old with a history of mitral stenosis, possible interstitial lung disease, anemia, tobacco abuse, HTN, hyperlipidemia, PAF, and HFpEF.   ECHO 2021 with EF 65-70% and severe mitral stenosis.  Had RHC/LHC by Dr Rockey Situ last year which showed no significant coronary disease, CO 4.2, CI  2.  TEE 03/2020 with severe rheumatic mitral valve stenosis and preserved EF  Admitted for scheduled MVR. On 4/12 had mechanical MVR with clipping left atrial appendage. Renal funciton worsening.  Last night started on dopamine. Worsening renal function.   Creatinine 1.1> 3.4   Currently on dopamine 3 mcg + milrinone 0.3 + lasix drip 15 mg per hour. Sluggish urine output.   Has had ongoing shortness of breath. On Bipap. CCM consulted.   Review of Systems: [y] = yes, '[ ]'  = no On BIPAP information obtained from chart.  . General: Weight gain '[ ]' ; Weight loss '[ ]' ; Anorexia '[ ]' ; Fatigue [ Y]; Fever '[ ]' ; Chills '[ ]' ; Weakness '[ ]'   . Cardiac: Chest pain/pressure '[ ]' ; Resting SOB '[ ]' ; Exertional SOB [Y ]; Orthopnea '[ ]' ; Pedal Edema '[ ]' ; Palpitations '[ ]' ; Syncope '[ ]' ; Presyncope '[ ]' ; Paroxysmal nocturnal dyspnea'[ ]'   . Pulmonary: Cough '[ ]' ; Wheezing'[ ]' ; Hemoptysis'[ ]' ; Sputum '[ ]' ; Snoring '[ ]'   . GI: Vomiting'[ ]' ; Dysphagia'[ ]' ; Melena'[ ]' ; Hematochezia '[ ]' ; Heartburn'[ ]' ; Abdominal pain '[ ]' ; Constipation '[ ]' ; Diarrhea '[ ]' ; BRBPR '[ ]'   . GU: Hematuria'[ ]' ; Dysuria '[ ]' ; Nocturia'[ ]'   . Vascular: Pain in legs with walking '[ ]' ; Pain in feet with lying flat '[ ]' ; Non-healing sores '[ ]' ; Stroke '[ ]' ; TIA '[ ]' ; Slurred speech '[ ]' ;  . Neuro: Headaches'[ ]' ; Vertigo'[ ]' ; Seizures'[ ]' ; Paresthesias'[ ]' ;Blurred vision '[ ]' ; Diplopia '[ ]' ; Vision changes '[ ]'    . Ortho/Skin: Arthritis '[ ]' ; Joint pain [ Y]; Muscle pain '[ ]' ; Joint swelling '[ ]' ; Back Pain [Y ]; Rash '[ ]'   . Psych: Depression'[ ]' ; Anxiety'[ ]'   . Heme: Bleeding problems '[ ]' ; Clotting disorders '[ ]' ; Anemia '[ ]'   . Endocrine: Diabetes '[ ]' ; Thyroid dysfunction'[ ]'   Home Medications Prior to Admission medications   Medication Sig Start Date End Date Taking? Authorizing Provider  acetaminophen (TYLENOL) 500 MG tablet Take 1,000 mg by mouth every 6 (six) hours as needed for moderate pain.   Yes [provider]  ALPRAZolam (XANAX) 0.5 MG tablet TAKE 1 TABLET(0.5 MG) BY MOUTH AT BEDTIME AS NEEDED FOR ANXIETY Patient taking differently: Take 0.5 mg by mouth daily as needed for anxiety or sleep. 04/11/20  Yes Luiz Ochoa, NP  atorvastatin (LIPITOR) 80 MG tablet TAKE 1 TABLET BY MOUTH EVERY DAY AT 6 PM Patient taking differently: Take 80 mg by mouth every evening. 12/12/19  Yes Loel Dubonnet, NP  calcium carbonate (TUMS - DOSED IN MG ELEMENTAL CALCIUM) 500 MG chewable tablet Chew 500 mg by mouth daily as needed for indigestion or heartburn.   Yes [provider]  diltiazem (CARDIZEM CD) 120 MG 24 hr capsule TAKE 1 CAPSULE(120 MG) BY MOUTH DAILY Patient taking differently: Take 120 mg by mouth daily. 02/01/20  Yes Luiz Ochoa, NP  fluticasone (FLONASE) 50 MCG/ACT nasal spray Place 1 spray into both nostrils daily as needed for rhinitis.   Yes [provider]  metoprolol succinate (TOPROL-XL) 50 MG 24 hr tablet TAKE 1 TABLET BY MOUTH EVERY MORNING AND 2 TABLETS BY MOUTH EVERY NIGHT AT BEDTIME Patient taking differently: Take 50-100 mg by mouth See admin instructions. Take 1 tablet (50 mg) by mouth in the morning and take 2 tablets (100 mg) at bedtime 12/12/19  Yes Loel Dubonnet, NP  omeprazole (PRILOSEC) 40 MG capsule Take 1 capsule (40 mg total) by mouth daily. 12/06/19  Yes Boscia, Greer Ee, NP  potassium chloride (KLOR-CON) 10 MEQ tablet Take 30 mg in the  morning and 20 mg at bedtime Patient taking differently: Take 20-30 mEq by mouth See admin instructions. Take 3 tablets (30 meq) by mouth in the morning & take 20 meq by mouth in the evening 03/10/20  Yes Loel Dubonnet, NP  torsemide (DEMADEX) 20 MG tablet TAKE 2 TABLETS(40 MG) BY MOUTH TWICE DAILY Patient taking differently: Take 40 mg by mouth in the morning and at bedtime. 03/03/20  Yes Minna Merritts, MD  warfarin (COUMADIN) 2.5 MG tablet TAKE AS DIRECTED BY ANTI-COAG CLINIC Patient taking differently: Take 1.25 mg by mouth daily at 4 PM. 12/19/19  Yes Gollan, Kathlene November, MD  albuterol (VENTOLIN HFA) 108 (90 Base) MCG/ACT inhaler Inhale 2 puffs into the lungs every 6 (six) hours as needed for wheezing or shortness of breath. 01/25/20   Luiz Ochoa, NP  amoxicillin (AMOXIL) 500 MG tablet Take 2,000 mg by mouth See admin instructions. Take 4 capsules (2000 mg) by mouth 1 hour prior to dental work 03/06/20   [provider]  chlorpheniramine-HYDROcodone (TUSSIONEX PENNKINETIC ER) 10-8 MG/5ML SUER Take 5 mLs by mouth at bedtime as needed for cough. Patient not taking: No sig reported 04/03/20   Luiz Ochoa, NP  enoxaparin (LOVENOX) 120 MG/0.8ML injection Inject 0.74 mLs (111 mg total) into the skin daily for 4 days. 04/24/20 04/28/20  Rexene Alberts, MD    Past Medical History: Past Medical History:  Diagnosis Date  . (HFpEF) heart failure with preserved ejection fraction ([place])    a. 08/2017 Echo: EF 55-60%.  Grade 2 diastolic dysfunction.  Moderate mitral stenosis.  . Allergy   . Anemia   . Anxiety   . BRCA negative 03/22/2013  . Bronchitis 02/19/2016   ON LEVAQUIN PO  . Chest tightness   . CHF (congestive heart failure) ([place])   . Cigarette smoker 09/11/2017   8-10 day  . Dyspnea   . Dysrhythmia   . GERD (gastroesophageal reflux disease)   . Heart murmur    Per pt. dx by Dr. Roxy Manns via [place]  . History of kidney stones   . Hyperlipidemia   . Hypertension   .  Interstitial lung disease ([place])    a. CT 2013 b. 02/2018 CXR noted recurrent intersistial changes ILD vs chronic bronchitis  . Moderate mitral stenosis    a.  08/2017 TEE: EF 60 to 65%.  Moderate mitral stenosis.  Mean gradient 14 mmHg.  Valve area 2.59 cm by planimetry, 2.72 cm by pressure half-time.  . Persistent atrial fibrillation ([place])    a. 08/2017 s/p TEE/DCCV; b. CHA2DS2VASc = 2-->warfarin.  . Pneumonia   . S/P Maze operation for atrial fibrillation 04/29/2020   Complete bilateral atrial lesion set using bipolar radiofrequency and cryothermy ablation with clipping of LA appendage  .  S/P mitral valve replacement with Onyx bileaflet mechanical valve 04/29/2020   27/29 mm Onyx-Valve    Past Surgical History: Past Surgical History:  Procedure Laterality Date  . ABDOMINAL HYSTERECTOMY     total  . ABDOMINAL HYSTERECTOMY    . BREAST BIOPSY Bilateral 2012  . BREAST BIOPSY Right 08-07-12   fibroadenomatous changes and columnar cells  . BREAST BIOPSY  02/03/2015   stereo byrnett  . BUBBLE STUDY  04/01/2020   Procedure: BUBBLE STUDY;  Surgeon: [name], MD;  Location: [place];  Service: Cardiovascular;;  . CARDIAC CATHETERIZATION    . CHOLECYSTECTOMY N/A 02/27/2016   Procedure: LAPAROSCOPIC CHOLECYSTECTOMY;  Surgeon: Christene Lye, MD;  Location: [facility]S;  Service: General;  Laterality: N/A;  . CLIPPING OF ATRIAL APPENDAGE  04/29/2020   Procedure: CLIPPING OF ATRIAL APPENDAGE USING ATRICURE  PRO2 CLIP SIZE 45MM;  Surgeon: Rexene Alberts, MD;  Location: [facility] OR;  Service: Open Heart Surgery;;  . COLONOSCOPY WITH PROPOFOL N/A 09/26/2018   Procedure: COLONOSCOPY WITH PROPOFOL;  Surgeon: Lucilla Lame, MD;  Location: [facility] ENDOSCOPY;  Service: Endoscopy;  Laterality: N/A;  . CYSTOSCOPY W/ RETROGRADES Right 08/18/2018   Procedure: CYSTOSCOPY WITH RETROGRADE PYELOGRAM;  Surgeon: Billey Co, MD;  Location: [facility];  Service: Urology;  Laterality: Right;  .  CYSTOSCOPY/URETEROSCOPY/HOLMIUM LASER/STENT PLACEMENT Right 08/18/2018   Procedure: CYSTOSCOPY/URETEROSCOPY/STENT PLACEMENT;  Surgeon: Billey Co, MD;  Location: [facility];  Service: Urology;  Laterality: Right;  . DIAGNOSTIC LAPAROSCOPY    . ESOPHAGOGASTRODUODENOSCOPY (EGD) WITH PROPOFOL N/A 09/26/2018   Procedure: ESOPHAGOGASTRODUODENOSCOPY (EGD) WITH PROPOFOL;  Surgeon: Lucilla Lame, MD;  Location: [facility] ENDOSCOPY;  Service: Endoscopy;  Laterality: N/A;  . GIVENS CAPSULE STUDY N/A 11/03/2018   Procedure: GIVENS CAPSULE STUDY;  Surgeon: Lucilla Lame, MD;  Location: [facility] ENDOSCOPY;  Service: Endoscopy;  Laterality: N/A;  . JOINT REPLACEMENT Left    knee  . KNEE ARTHROPLASTY Right 04/09/2019   Procedure: COMPUTER ASSISTED TOTAL KNEE ARTHROPLASTY;  Surgeon: Dereck Leep, MD;  Location: [facility];  Service: Orthopedics;  Laterality: Right;  . KNEE CLOSED REDUCTION Left 04/15/2015   Procedure: CLOSED MANIPULATION KNEE;  Surgeon: Thornton Park, MD;  Location: [facility];  Service: Orthopedics;  Laterality: Left;  . KNEE SURGERY    . MAZE N/A 04/29/2020   Procedure: MAZE;  Surgeon: Rexene Alberts, MD;  Location: [place];  Service: Open Heart Surgery;  Laterality: N/A;  . MITRAL VALVE REPLACEMENT N/A 04/29/2020   Procedure: MITRAL VALVE (MV) REPLACEMENT USING ON-X VALVE SIZE 27/29MM;  Surgeon: Rexene Alberts, MD;  Location: [place];  Service: Open Heart Surgery;  Laterality: N/A;  . MULTIPLE EXTRACTIONS WITH ALVEOLOPLASTY N/A 02/28/2020   Procedure: MULTIPLE EXTRACTION WITH ALVEOLOPLASTY;  Surgeon: [name], DMD;  Location: [place];  Service: Dentistry;  Laterality: N/A;  . RIGHT/LEFT HEART CATH AND CORONARY ANGIOGRAPHY Bilateral 09/06/2019   Procedure: RIGHT/LEFT HEART CATH AND CORONARY ANGIOGRAPHY;  Surgeon: Minna Merritts, MD;  Location: [place] CV LAB;  Service: Cardiovascular;  Laterality: Bilateral;  . TEE WITHOUT CARDIOVERSION N/A 09/13/2017   Procedure: TRANSESOPHAGEAL  ECHOCARDIOGRAM (TEE);  Surgeon: Nelva Bush, MD;  Location: [facility];  Service: Cardiovascular;  Laterality: N/A;  . TEE WITHOUT CARDIOVERSION N/A 04/01/2020   Procedure: TRANSESOPHAGEAL ECHOCARDIOGRAM (TEE);  Surgeon: [name], MD;  Location: [facility] ENDOSCOPY;  Service: Cardiovascular;  Laterality: N/A;  . TEE WITHOUT CARDIOVERSION N/A 04/29/2020   Procedure: TRANSESOPHAGEAL ECHOCARDIOGRAM (TEE);  Surgeon: Rexene Alberts, MD;  Location:  [place] OR;  Service: Open Heart Surgery;  Laterality: N/A;  . TOTAL KNEE ARTHROPLASTY Left 12/25/2014   Procedure: TOTAL KNEE ARTHROPLASTY;  Surgeon: Thornton Park, MD;  Location: [facility] ORS;  Service: Orthopedics;  Laterality: Left;  . TUBAL LIGATION      Family History: Family History  Problem Relation Age of Onset  . Cancer Mother 38       breast  . Hypertension Mother   . Cancer Maternal Aunt        breast  . Cancer Maternal Grandmother        breast  . Osteoarthritis Father   . Hypertension Father     Social History: Social History   Socioeconomic History  . Marital status: Married    Spouse name: Not on file  . Number of children: Not on file  . Years of education: Not on file  . Highest education level: Not on file  Occupational History  . Not on file  Tobacco Use  . Smoking status: Former Smoker    Packs/day: 0.50    Years: 20.00    Pack years: 10.00    Types: Cigarettes    Quit date: 02/20/2018    Years since quitting: 2.1  . Smokeless tobacco: Never Used  Vaping Use  . Vaping Use: Never used  Substance and Sexual Activity  . Alcohol use: Yes    Comment: rarely  . Drug use: No  . Sexual activity: Not on file  Other Topics Concern  . Not on file  Social History Narrative  . Not on file   Social Determinants of Health   Financial Resource Strain: Not on file  Food Insecurity: Not on file  Transportation Needs: Not on file  Physical Activity: Not on file  Stress: Not on file  Social Connections: Not on file     Allergies:  Allergies  Allergen Reactions  . Morphine Hives and Rash  . Oxycodone Hcl Hives and Itching  . Dilaudid [Hydromorphone Hcl] Itching    Objective:    Vital Signs:   Temp:  [97 F (36.1 C)-99.86 F (37.7 C)] 99.1 F (37.3 C) (04/14 0808) Pulse Rate:  [67-104] 100 (04/14 0700) Resp:  [11-27] 17 (04/14 0700) BP: (84-143)/(29-92) 119/92 (04/13 2200) SpO2:  [87 %-100 %] 95 % (04/14 0733) Arterial Line BP: (130-160)/(44-55) 139/49 (04/14 0700) FiO2 (%):  [40 %-100 %] 40 % (04/14 0733) Last BM Date: 04/28/20  Weight change: Filed Weights   04/29/20 0553 04/30/20 0520  Weight: 111.3 kg 115.7 kg    Intake/Output:   Intake/Output Summary (Last 24 hours) at 05/01/2020 0854 Last data filed at 05/01/2020 0700 Gross per 24 hour  Intake 1160.3 ml  Output 768 ml  Net 392.3 ml      Physical Exam    General:Sitting in the chair. On Bipap HEENT: normal Neck: supple. JVP difficult to assess. . Carotids 2+ bilat; no bruits. No lymphadenopathy or thyromegaly appreciated. RIJ Cor: PMI nondisplaced. Regular rate & rhythm. No rubs, gallops or murmurs. Lungs: clear Abdomen: soft, nontender, nondistended. No hepatosplenomegaly. No bruits or masses. Good bowel sounds. Extremities: no cyanosis, clubbing, rash, edema. LUE a line  Neuro: alert . Follows commands. MAE   Telemetry   SR 99-100  EKG    n/a  Labs   Basic Metabolic Panel: Recent Labs  Lab 04/25/20 1030 04/29/20 0807 04/29/20 1201 04/29/20 1357 04/29/20 2016 04/29/20 2308 04/30/20 0420 04/30/20 0610 04/30/20 1654 04/30/20 2026 04/30/20 2201 05/01/20 0328  NA 137   < >  141   < > 139   < > 140 142 134* 134* 136 135  K 3.5   < > 3.7   < > 4.3   < > 3.7 3.8 3.9 3.8 3.8 3.7  CL 102   < > 101  --  111  --  110  --  104  --   --  100  CO2 25  --   --   --  23  --  22  --  22  --   --  23  GLUCOSE 114*   < > 166*  --  160*  --  146*  --  193*  --   --  167*  BUN 15   < > 20  --  16  --  19  --  26*   --   --  33*  CREATININE 1.23*   < > 1.00  --  1.41*  --  1.75*  --  2.66*  --   --  3.38*  CALCIUM 8.9  --   --   --  7.2*  --  7.7*  --  8.1*  --   --  8.4*  MG  --   --   --   --  3.5*  --  3.2*  --  2.9*  --   --   --    < > = values in this interval not displayed.    Liver Function Tests: Recent Labs  Lab 04/25/20 1030  AST 34  ALT 31  ALKPHOS 144*  BILITOT 0.4  PROT 7.8  ALBUMIN 3.7   No results for input(s): LIPASE, AMYLASE in the last 168 hours. No results for input(s): AMMONIA in the last 168 hours.  CBC: Recent Labs  Lab 04/29/20 1412 04/29/20 2016 04/29/20 2308 04/30/20 0420 04/30/20 0610 04/30/20 1654 04/30/20 2026 04/30/20 2201 05/01/20 0328  WBC 13.0* 12.9*  --  16.2*  --  19.1*  --   --  18.2*  HGB 10.3* 9.2*   < > 8.7* 9.5* 8.4* 7.8* 7.8* 9.2*  HCT 30.8* 28.1*   < > 26.5* 28.0* 25.8* 23.0* 23.0* 27.6*  MCV 88.0 89.8  --  89.5  --  91.5  --   --  87.3  PLT 169 166  --  164  --  151  --   --  145*   < > = values in this interval not displayed.    Cardiac Enzymes: No results for input(s): CKTOTAL, CKMB, CKMBINDEX, TROPONINI in the last 168 hours.  BNP: BNP (last 3 results) No results for input(s): BNP in the last 8760 hours.  ProBNP (last 3 results) No results for input(s): PROBNP in the last 8760 hours.   CBG: Recent Labs  Lab 04/30/20 1554 04/30/20 2004 05/01/20 0001 05/01/20 0328 05/01/20 0809  GLUCAP 182* 188* 154* 168* 161*    Coagulation Studies: Recent Labs    04/29/20 0606 04/29/20 1412 05/01/20 0328  LABPROT 19.1* 25.6* 23.9*  INR 1.7* 2.3* 2.1*     Imaging   DG Chest Port 1 View  Result Date: 05/01/2020 CLINICAL DATA:  Respiratory distress EXAM: PORTABLE CHEST 1 VIEW COMPARISON:  This 04/30/2020 FINDINGS: Right internal jugular Cordis introducer remains, however, Swan-Ganz catheter has been removed. Mediastinal drain and left basilar chest tube are unchanged. Pulmonary insufflation is stable. Superimposed pulmonary  edema has significantly improved, though mild perihilar interstitial edema persists. No pneumothorax or pleural effusion. Mitral valve replacement and left atrial clipping  have been performed. Cardiac size within normal limits. IMPRESSION: Stable pulmonary insufflation. Improving interstitial pulmonary edema. Left basilar chest tube in place.  No pneumothorax. Electronically Signed   By: Fidela Salisbury MD   On: 05/01/2020 06:30      Medications:     Current Medications: . acetaminophen  1,000 mg Oral Q6H  . bisacodyl  10 mg Oral Daily   Or  . bisacodyl  10 mg Rectal Daily  . Chlorhexidine Gluconate Cloth  6 each Topical Daily  . docusate sodium  200 mg Oral Daily  . insulin aspart  0-24 Units Subcutaneous Q4H  . mouth rinse  15 mL Mouth Rinse BID  . pantoprazole  40 mg Oral Daily  . sodium chloride flush  10-40 mL Intracatheter Q12H  . sodium chloride flush  3 mL Intravenous Q12H  . Warfarin - Physician Dosing Inpatient   Does not apply q1600     Infusions: . sodium chloride    . sodium chloride    . DOPamine 3 mcg/kg/min (05/01/20 0700)  . furosemide (LASIX) 200 mg in dextrose 5% 100 mL (50m/mL) infusion 15 mg/hr (05/01/20 0841)  . lactated ringers    . lactated ringers    . milrinone 0.3 mcg/kg/min (05/01/20 0733)       Assessment/Plan   1. Mitral Valve Stenosis-->S/P Mechanical MVR - HFpEF -->TEE EF 60-65% with severe mitral stenosis.  -Remains on milrinone 0.3 + Dop 3 mcg.  - Volume status difficult to assess. Set up CVP.  -On lasix drip at 15 mg per hour + dopamine 3 mcg + milrinone 0.3.   - CO-OX 59%.  - Sluggish urine output.   2.AKI  Creatinine 1.1--->3.4 - Will need nephrology  -Check urine sodium   3. Acute Hypoxemic Respiratory Failure ? ILD prior to admit.  Ongoing shortness of breath.  On BIPAP  4. PAF In SR today.   - Coumadin on hold in the event she may need HD. ? Adding heparin.   Additional plan per Dr BHaroldine Laws    Length of Stay:  2  ADarrick Grinder NP  05/01/2020, 8:54 AM  Advanced Heart Failure Team Pager [phone](M-F; 7a - 5p)  Please contact [facility] for night-coverage after hours (4p -7a ) and weekends on [email or web address]  Agree with above.   55y/o woman with morbid obesity,. HTN, PAF, OSA and rheumtic heart disease with severe [place]. Underwent mechanical MVR and maze on 4/12.   Post-op had pulmonary edema requiring BIPAP. Now improved but has developed AKI with poor urine output despite lasix gtt at 15.  No weight today but weight up 12 pounds yesterday. Una < 10. Co-ox 59% Cr 1.2-> 3.4  General:  Sitting up in bed No resp difficulty HEENT: normal Neck: supple. RIJ introducer JVP to ear Carotids 2+ bilat; no bruits. No lymphadenopathy or thryomegaly appreciated. Cor: PMI nondisplaced. Regular rate & rhythm.mechanical s1 Lungs: decreased at basese Abdomen: obese soft, nontender, + distended. No hepatosplenomegaly. No bruits or masses. Good bowel sounds. Extremities: no cyanosis, clubbing, rash, 2-3+ edema Neuro: alert & orientedx3, cranial nerves grossly intact. moves all 4 extremities w/o difficulty. Affect pleasant  She is markedly volume overloaded with AKI. Agree with continue milrinone and DA. Will increase lasix to 30/hr and add metolazone to try and pull fluid. But given UNa < 10 I am not optimistic that this will work and suspect she will need CVVHD tomorrow, if not sooner.   CRITICAL CARE Performed by: BGlori Bickers Total critical  care time: 45 minutes  Critical care time was exclusive of separately billable procedures and treating other patients.  Critical care was necessary to treat or prevent imminent or life-threatening deterioration.  Critical care was time spent personally by me (independent of midlevel providers or residents) on the following activities: development of treatment plan with patient and/or surrogate as well as nursing, discussions with consultants, evaluation of patient's  response to treatment, examination of patient, obtaining history from patient or surrogate, ordering and performing treatments and interventions, ordering and review of laboratory studies, ordering and review of radiographic studies, pulse oximetry and re-evaluation of patient's condition.  Glori Bickers, MD  3:57 PM

## 2020-05-02 ENCOUNTER — Inpatient Hospital Stay (HOSPITAL_COMMUNITY): Payer: 59

## 2020-05-02 DIAGNOSIS — J9601 Acute respiratory failure with hypoxia: Secondary | ICD-10-CM | POA: Diagnosis not present

## 2020-05-02 DIAGNOSIS — N179 Acute kidney failure, unspecified: Secondary | ICD-10-CM | POA: Diagnosis not present

## 2020-05-02 DIAGNOSIS — I342 Nonrheumatic mitral (valve) stenosis: Secondary | ICD-10-CM | POA: Diagnosis not present

## 2020-05-02 DIAGNOSIS — I5033 Acute on chronic diastolic (congestive) heart failure: Secondary | ICD-10-CM | POA: Diagnosis not present

## 2020-05-02 DIAGNOSIS — J810 Acute pulmonary edema: Secondary | ICD-10-CM | POA: Diagnosis not present

## 2020-05-02 DIAGNOSIS — I5031 Acute diastolic (congestive) heart failure: Secondary | ICD-10-CM | POA: Diagnosis not present

## 2020-05-02 DIAGNOSIS — Z954 Presence of other heart-valve replacement: Secondary | ICD-10-CM | POA: Diagnosis not present

## 2020-05-02 LAB — COMPREHENSIVE METABOLIC PANEL
ALT: 54 U/L — ABNORMAL HIGH (ref 0–44)
AST: 116 U/L — ABNORMAL HIGH (ref 15–41)
Albumin: 3 g/dL — ABNORMAL LOW (ref 3.5–5.0)
Alkaline Phosphatase: 83 U/L (ref 38–126)
Anion gap: 10 (ref 5–15)
BUN: 47 mg/dL — ABNORMAL HIGH (ref 6–20)
CO2: 23 mmol/L (ref 22–32)
Calcium: 8.6 mg/dL — ABNORMAL LOW (ref 8.9–10.3)
Chloride: 99 mmol/L (ref 98–111)
Creatinine, Ser: 3.75 mg/dL — ABNORMAL HIGH (ref 0.44–1.00)
GFR, Estimated: 14 mL/min — ABNORMAL LOW (ref >60)
Glucose, Bld: 143 mg/dL — ABNORMAL HIGH (ref 70–99)
Potassium: 3.6 mmol/L (ref 3.5–5.1)
Sodium: 132 mmol/L — ABNORMAL LOW (ref 135–145)
Total Bilirubin: 0.7 mg/dL (ref 0.3–1.2)
Total Protein: 6.4 g/dL — ABNORMAL LOW (ref 6.5–8.1)

## 2020-05-02 LAB — ECHOCARDIOGRAM LIMITED
Height: 64 in
Weight: 4254 oz

## 2020-05-02 LAB — COOXEMETRY PANEL
Carboxyhemoglobin: 0.6 % (ref 0.5–1.5)
Methemoglobin: 0.9 % (ref 0.0–1.5)
O2 Saturation: 58.3 %
Total hemoglobin: 10.5 g/dL — ABNORMAL LOW (ref 12.0–16.0)

## 2020-05-02 LAB — CBC
HCT: 24 % — ABNORMAL LOW (ref 36.0–46.0)
Hemoglobin: 8.1 g/dL — ABNORMAL LOW (ref 12.0–15.0)
MCH: 29.3 pg (ref 26.0–34.0)
MCHC: 33.8 g/dL (ref 30.0–36.0)
MCV: 87 fL (ref 80.0–100.0)
Platelets: 130 10*3/uL — ABNORMAL LOW (ref 150–400)
RBC: 2.76 MIL/uL — ABNORMAL LOW (ref 3.87–5.11)
RDW: 15.1 % (ref 11.5–15.5)
WBC: 14.1 10*3/uL — ABNORMAL HIGH (ref 4.0–10.5)
nRBC: 0.4 % — ABNORMAL HIGH (ref 0.0–0.2)

## 2020-05-02 LAB — GLUCOSE, CAPILLARY
Glucose-Capillary: 142 mg/dL — ABNORMAL HIGH (ref 70–99)
Glucose-Capillary: 143 mg/dL — ABNORMAL HIGH (ref 70–99)
Glucose-Capillary: 136 mg/dL — ABNORMAL HIGH (ref 70–99)
Glucose-Capillary: 151 mg/dL — ABNORMAL HIGH (ref 70–99)
Glucose-Capillary: 122 mg/dL — ABNORMAL HIGH (ref 70–99)
Glucose-Capillary: 116 mg/dL — ABNORMAL HIGH (ref 70–99)

## 2020-05-02 LAB — PROTIME-INR
INR: 2.3 — ABNORMAL HIGH (ref 0.8–1.2)
Prothrombin Time: 25.7 seconds — ABNORMAL HIGH (ref 11.4–15.2)

## 2020-05-02 MED ORDER — PERFLUTREN LIPID MICROSPHERE
1.0000 mL | INTRAVENOUS | Status: AC | PRN
  Administered 2020-05-02: 2 mL via INTRAVENOUS
  Filled 2020-05-02: qty 10

## 2020-05-02 MED ORDER — ~~LOC~~ CARDIAC SURGERY, PATIENT & FAMILY EDUCATION: Freq: Once | Status: AC

## 2020-05-02 MED ORDER — METOLAZONE 5 MG PO TABS
5.0000 mg | ORAL_TABLET | Freq: Once | ORAL | Status: AC
  Administered 2020-05-02: 5 mg via ORAL
  Filled 2020-05-02: qty 1

## 2020-05-02 MED ORDER — WARFARIN SODIUM 1 MG PO TABS
1.0000 mg | ORAL_TABLET | Freq: Every day | ORAL | Status: DC
  Administered 2020-05-02 – 2020-05-03 (×2): 1 mg via ORAL
  Filled 2020-05-02 (×2): qty 1

## 2020-05-02 MED ORDER — POTASSIUM CHLORIDE CRYS ER 20 MEQ PO TBCR
20.0000 meq | EXTENDED_RELEASE_TABLET | Freq: Once | ORAL | Status: AC
  Administered 2020-05-02: 20 meq via ORAL
  Filled 2020-05-02: qty 1

## 2020-05-02 MED ORDER — ALUM & MAG HYDROXIDE-SIMETH 200-200-20 MG/5ML PO SUSP
30.0000 mL | Freq: Once | ORAL | Status: AC
  Administered 2020-05-02: 30 mL via ORAL
  Filled 2020-05-02: qty 30

## 2020-05-02 MED ORDER — POLYETHYLENE GLYCOL 3350 17 G PO PACK
17.0000 g | PACK | Freq: Every day | ORAL | Status: DC
  Administered 2020-05-02 – 2020-05-07 (×4): 17 g via ORAL
  Filled 2020-05-02 (×6): qty 1

## 2020-05-02 NOTE — Progress Notes (Signed)
Nephrology Follow-Up Consult note   Assessment/Recommendations: Leslie [name] is a/an 55 y.o. female with a past medical history significant for mitral stenosis, CKD, nephrolithiasis, admitted for mitral stenosis s/p MVR w/ MAZE and LA appendage clipping on 4/12.  Course has been complicated by AKI.     Non-Oliguric AKI, severe, worsening: Likely secondary to ATN from hypoperfusion during surgery.  No signs of obvious obstruction.  Urine output is improving with aggressive diuretic regimen.  Creatinine only minimally worsened over the past 24 hours. -Continue aggressive diuresis with Lasix infusion and metolazone as needed -No acute need for dialysis at this time, continue to monitor -Continue to monitor daily Cr, Dose meds for GFR -Monitor Daily I/Os, Daily weight  -Maintain MAP>65 for optimal renal perfusion.  -Avoid nephrotoxic medications including NSAIDs and Vanc/Zosyn combo  Volume Status: Appears volume overloaded with chest x-ray demonstrating some pulmonary edema.  Clinically stable today and subjective improvement of symptoms.  Continue diuresis as above.  Mitral valve stenosis: s/p repair. Surgery managing  Hypertension: Currently on dopamine.  Continue to monitor blood pressures  Anemia: Likely multifactorial with acute illness contributing.  Hemoglobin 8.1.  Management per primary   Recommendations conveyed to primary service.    [facility] 05/02/2020 9:15 AM  ___________________________________________________________  CC: AKI on CKD  Interval History/Subjective: Patient relatively stable over the past 24 hours.  Continues on dopamine and Lasix infusion and received metolazone 5 mg yesterday.  Urine output increasing 1 L yesterday.  Blood pressure has not been low.  Creatinine slightly worsened.  Patient states that she feels slightly better.  Breathing is slightly improved.   Medications:  Current Facility-Administered Medications   Medication Dose Route Frequency Provider Last Rate Last Admin  . 0.9 %  sodium chloride infusion  250 mL Intravenous Continuous Barrett, Erin R, PA-C      . 0.9 %  sodium chloride infusion   Intra-arterial PRN Rexene Alberts, MD      . acetaminophen (TYLENOL) tablet 1,000 mg  1,000 mg Oral Q6H Barrett, Erin R, PA-C   1,000 mg at 05/02/20 2993  . bisacodyl (DULCOLAX) EC tablet 10 mg  10 mg Oral Daily Barrett, Erin R, PA-C   10 mg at 05/01/20 1048   Or  . bisacodyl (DULCOLAX) suppository 10 mg  10 mg Rectal Daily Barrett, Erin R, PA-C      . Chlorhexidine Gluconate Cloth 2 % PADS 6 each  6 each Topical Daily Rexene Alberts, MD   6 each at 05/01/20 2105  . [facility], Patient & Family Education   Does not apply Once Rexene Alberts, MD      . docusate sodium (COLACE) capsule 200 mg  200 mg Oral Daily Barrett, Erin R, PA-C   200 mg at 05/01/20 1048  . DOPamine (INTROPIN) 800 mg in dextrose 5 % 250 mL (3.2 mg/mL) infusion  3 mcg/kg/min Intravenous Titrated Rexene Alberts, MD 6.51 mL/hr at 05/02/20 0700 3 mcg/kg/min at 05/02/20 0700  . fentaNYL (SUBLIMAZE) injection 50-100 mcg  50-100 mcg Intravenous Q1H PRN Barrett, Erin R, PA-C   50 mcg at 05/02/20 0426  . furosemide (LASIX) 200 mg in dextrose 5 % 100 mL (2 mg/mL) infusion  30 mg/hr Intravenous Continuous Bensimhon, Shaune Pascal, MD 15 mL/hr at 05/02/20 0900 30 mg/hr at 05/02/20 0900  . insulin aspart (novoLOG) injection 0-24 Units  0-24 Units Subcutaneous Q4H Rexene Alberts, MD   2 Units at 05/02/20 0827  .  lactated ringers infusion   Intravenous Continuous Barrett, Erin R, PA-C      . lactated ringers infusion   Intravenous Continuous Barrett, Erin R, PA-C      . MEDLINE mouth rinse  15 mL Mouth Rinse BID Rexene Alberts, MD   15 mL at 05/01/20 2102  . milrinone (PRIMACOR) 20 MG/100 ML (0.2 mg/mL) infusion  0.3 mcg/kg/min Intravenous Continuous Rexene Alberts, MD 10.02 mL/hr at 05/02/20 0804 0.3 mcg/kg/min at 05/02/20 0804   . ondansetron (ZOFRAN) injection 4 mg  4 mg Intravenous Q6H PRN Barrett, Erin R, PA-C   4 mg at 05/02/20 0400  . pantoprazole (PROTONIX) EC tablet 40 mg  40 mg Oral Daily Barrett, Erin R, PA-C   40 mg at 05/01/20 1048  . polyethylene glycol (MIRALAX / GLYCOLAX) packet 17 g  17 g Oral Daily Noemi Chapel P, DO      . sodium chloride flush (NS) 0.9 % injection 10-40 mL  10-40 mL Intracatheter Q12H Rexene Alberts, MD   10 mL at 05/01/20 2104  . sodium chloride flush (NS) 0.9 % injection 10-40 mL  10-40 mL Intracatheter PRN Rexene Alberts, MD      . sodium chloride flush (NS) 0.9 % injection 3 mL  3 mL Intravenous Q12H Barrett, Erin R, PA-C   3 mL at 05/01/20 2104  . sodium chloride flush (NS) 0.9 % injection 3 mL  3 mL Intravenous PRN Barrett, Erin R, PA-C      . traMADol (ULTRAM) tablet 50-100 mg  50-100 mg Oral Q4H PRN Barrett, Erin R, PA-C   100 mg at 04/30/20 1701  . warfarin (COUMADIN) tablet 1 mg  1 mg Oral q1600 Rexene Alberts, MD      . Warfarin - Physician Dosing Inpatient   Does not apply q1600 Rexene Alberts, MD   Given at 04/30/20 1801      Review of Systems: 10 systems reviewed and negative except per interval history/subjective  Physical Exam: Vitals:   05/02/20 0739 05/02/20 0819  BP:  (!) 96/48  Pulse:  100  Resp:  16  Temp: 98.3 F (36.8 C)   SpO2:  91%   Total I/O In: 61.8 [I.V.:61.8] Out: 160 [Urine:160]  Intake/Output Summary (Last 24 hours) at 05/02/2020 0915 Last data filed at 05/02/2020 0900 Gross per 24 hour  Intake 735.42 ml  Output 1170 ml  Net -434.58 ml   Constitutional: sitting in chair, no distress ENMT: ears and nose without scars or lesions, MMM CV: normal rate, trace bilateral LEE Respiratory: Bilateral chest rise with mild increased work of breathing Gastrointestinal: soft, non-tender, no palpable masses or hernias Skin: no visible lesions or rashes Psych: alert, judgement/insight appropriate, appropriate mood and affect   Test  Results I personally reviewed new and old clinical labs and radiology tests Lab Results  Component Value Date   NA 132 (L) 05/02/2020   K 3.6 05/02/2020   CL 99 05/02/2020   CO2 23 05/02/2020   BUN 47 (H) 05/02/2020   CREATININE 3.75 (H) 05/02/2020   CALCIUM 8.6 (L) 05/02/2020   ALBUMIN 3.0 (L) 05/02/2020   PHOS 3.3 03/26/2020

## 2020-05-02 NOTE — Progress Notes (Addendum)
Advanced Heart Failure Rounding Note  PCP-Cardiologist: Ida Rogue, MD    Patient Profile   55 y/o woman with morbid obesity, HTN, PAF, OSA and rheumtic heart disease with severe MS. Underwent mechanical MVR and maze on 4/12. Post op course c/b pulmonary edema requiring BIPAP + nonoliguric AKI.    Subjective:    On Milrinone 0.3 + Dopamine 3 + Lasix gtt at 30/hr.   Co-ox 58%.   Scr continues to trend up, 1.0>1.4>1.8>2.7>3.4>>3.8   BUN 47  K 3.6   SBPs 150s through a-line, MAPs 75   - 1.1L in UOP yesterday on lasix gtt    F/u CXR today w/ mild pulmonary edema. Wt up 20 lb from pre-op wt.   OOB sitting up in chair. No complaints currently.     Objective:   Weight Range: 120.6 kg Body mass index is 45.64 kg/m.   Vital Signs:   Temp:  [98.3 F (36.8 C)-99.3 F (37.4 C)] 98.3 F (36.8 C) (04/15 0739) Pulse Rate:  [92-109] 100 (04/15 0819) Resp:  [13-28] 16 (04/15 0819) BP: (96)/(48) 96/48 (04/15 0819) SpO2:  [91 %-100 %] 91 % (04/15 0819) Arterial Line BP: (141-198)/(51-68) 158/52 (04/15 0819) FiO2 (%):  [40 %] 40 % (04/15 0312) Weight:  [120.6 kg] 120.6 kg (04/15 0500) Last BM Date: 04/28/20  Weight change: Filed Weights   04/29/20 0553 04/30/20 0520 05/02/20 0500  Weight: 111.3 kg 115.7 kg 120.6 kg    Intake/Output:   Intake/Output Summary (Last 24 hours) at 05/02/2020 0834 Last data filed at 05/02/2020 0800 Gross per 24 hour  Intake 673.64 ml  Output 1070 ml  Net -396.36 ml      Physical Exam    General:  Well appearing, obese F sitting up in chair. No resp difficulty HEENT: Normal Neck: Supple. JVP elevated to jaw. Carotids 2+ bilat; no bruits. No lymphadenopathy or thyromegaly appreciated. Cor: PMI nondisplaced. Regular rate & rhythm. +sternal dressing. + crisp mechanical valve sounds  Lungs: decreased BS at the bases  Abdomen: obese, soft, nontender, nondistended. No hepatosplenomegaly. No bruits or masses. Good bowel  sounds. Extremities: No cyanosis, clubbing, rash, 2+ bilateral pretibial edema  Neuro: Alert & orientedx3, cranial nerves grossly intact. moves all 4 extremities w/o difficulty. Affect pleasant   Telemetry   NSR 70s  EKG    No new EKG to review   Labs    CBC Recent Labs    05/01/20 0328 05/02/20 0409  WBC 18.2* 14.1*  HGB 9.2* 8.1*  HCT 27.6* 24.0*  MCV 87.3 87.0  PLT 145* 161*   Basic Metabolic Panel Recent Labs    04/30/20 0420 04/30/20 0610 04/30/20 1654 04/30/20 2026 05/01/20 0328 05/02/20 0409  NA 140   < > 134*   < > 135 132*  K 3.7   < > 3.9   < > 3.7 3.6  CL 110  --  104  --  100 99  CO2 22  --  22  --  23 23  GLUCOSE 146*  --  193*  --  167* 143*  BUN 19  --  26*  --  33* 47*  CREATININE 1.75*  --  2.66*  --  3.38* 3.75*  CALCIUM 7.7*  --  8.1*  --  8.4* 8.6*  MG 3.2*  --  2.9*  --   --   --    < > = values in this interval not displayed.   Liver Function Tests Recent Labs  05/02/20 0409  AST 116*  ALT 54*  ALKPHOS 83  BILITOT 0.7  PROT 6.4*  ALBUMIN 3.0*   No results for input(s): LIPASE, AMYLASE in the last 72 hours. Cardiac Enzymes No results for input(s): CKTOTAL, CKMB, CKMBINDEX, TROPONINI in the last 72 hours.  BNP: BNP (last 3 results) No results for input(s): BNP in the last 8760 hours.  ProBNP (last 3 results) No results for input(s): PROBNP in the last 8760 hours.   D-Dimer No results for input(s): DDIMER in the last 72 hours. Hemoglobin A1C No results for input(s): HGBA1C in the last 72 hours. Fasting Lipid Panel No results for input(s): CHOL, HDL, LDLCALC, TRIG, CHOLHDL, LDLDIRECT in the last 72 hours. Thyroid Function Tests No results for input(s): TSH, T4TOTAL, T3FREE, THYROIDAB in the last 72 hours.  Invalid input(s): FREET3  Other results:   Imaging    DG Chest Port 1 View  Result Date: 05/02/2020 CLINICAL DATA:  Respiratory distress EXAM: PORTABLE CHEST 1 VIEW COMPARISON:  05/01/2020 FINDINGS:  Mediastinal drain and left chest tube have been removed. Right internal jugular Cordis introducer is unchanged. Lung volumes are small, but are stable. Bibasilar atelectasis persists. Superimposed perihilar pulmonary infiltrate has progressed in the interval in keeping with probable progressive pulmonary edema. No pneumothorax or pleural effusion. Mitral valve replacement and left atrial clipping has been performed. Cardiac size within normal limits. IMPRESSION: Interval mediastinal drain and left chest tube removal. No pneumothorax. Preserved pulmonary hypoinflation. Progressive perihilar pulmonary infiltrate most in keeping with mild pulmonary edema. Electronically Signed   By: Fidela Salisbury MD   On: 05/02/2020 07:35      Medications:     Scheduled Medications: . acetaminophen  1,000 mg Oral Q6H  . bisacodyl  10 mg Oral Daily   Or  . bisacodyl  10 mg Rectal Daily  . Chlorhexidine Gluconate Cloth  6 each Topical Daily  . [facility], Patient & Family Education   Does not apply Once  . docusate sodium  200 mg Oral Daily  . insulin aspart  0-24 Units Subcutaneous Q4H  . mouth rinse  15 mL Mouth Rinse BID  . pantoprazole  40 mg Oral Daily  . sodium chloride flush  10-40 mL Intracatheter Q12H  . sodium chloride flush  3 mL Intravenous Q12H  . warfarin  1 mg Oral q1600  . Warfarin - Physician Dosing Inpatient   Does not apply q1600     Infusions: . sodium chloride    . sodium chloride    . DOPamine 3 mcg/kg/min (05/02/20 0700)  . furosemide (LASIX) 200 mg in dextrose 5% 100 mL (2mg /mL) infusion 30 mg/hr (05/02/20 0811)  . lactated ringers    . lactated ringers    . milrinone 0.3 mcg/kg/min (05/02/20 0804)     PRN Medications:  Place/Maintain arterial line **AND** sodium chloride, fentaNYL (SUBLIMAZE) injection, ondansetron (ZOFRAN) IV, sodium chloride flush, sodium chloride flush, traMADol    Patient Profile   55 y/o woman with morbid obesity, HTN, PAF, OSA  and rheumtic heart disease with severe MS. Underwent mechanical MVR and maze on 4/12. Post op course c/b pulmonary edema requiring BIPAP + nonoliguric AKI.   Assessment/Plan   1. Severe Mitral Valve Stenosis - 2/2 rheumatic heart disease  - s/p mechanical MVR 4/12 - on warfrain, INR 2.3   2. Acute CHF/Pulmonary Edema>>Cardiogenic Shock  - perioperative TEE EF 60-65% with severe mitral stenosis. - developed acute pulmonary edema post op + AKI - initial Co-ox 43% -  co-ox 58% today on milrinone 0.3 + Dopamine 3 - plan limited echo today to reassess LVEF ? Post-op Cardiac stunning  - remains fluid overloaded w/ suboptimal response to lasix gtt + worsening renal fx - may need CRRT  to assist w/ volume removal/ renal rest  - ? Stopping dopamine and switch from milrinone>>DBA given worsening renal fx  3. AKI  - 1.0>1.4>1.8>2.7>3.4>>3.8  - nephrology following  - suspect due to low output, continue inotropic support - avoid hypotension  4. Acute Hypoxemic Respiratory Failure ? ILD prior to admit - currently off BiPAP - CXR today w/ mild pulmonary edema - continue IV Lasix, may need CRRT  - PCCM following   5. PAF - s/p MAZE at time of MVR  - in NSR on tele  - plan lifelong warfarin given mechanical MV     Length of Stay: [phone] Rosita Fire, PA-C  05/02/2020, 8:34 AM  Advanced Heart Failure Team Pager [phone] (M-F; 7a - 5p)  Please contact [facility] for night-coverage after hours (5p -7a ) and weekends on [email or web address]  Agree with above.   Lasix gtt increased to 30 yesterday. Metolazone added. Remains on milrinone and DA. Urine output picked up some. Creatinine still rising but seem to be plateauing a bit 3.4 -> 3.75. No uremic symptoms. Respiratory status stable On warfarin for MVR Co-ox 58%  General:  Sitting up  HEENT: normal Neck: supple. RIJ introducer. JVP up Carotids 2+ bilat; no bruits. No lymphadenopathy or thryomegaly appreciated. Cor: PMI nondisplaced.  Regular rate & rhythm. Mechanical s1 No rubs, gallops or murmurs. Lungs: decreased bases Abdomen: soft, nontender, nondistended. No hepatosplenomegaly. No bruits or masses. Good bowel sounds. Extremities: no cyanosis, clubbing, rash, 2+ edema Neuro: alert & orientedx3, cranial nerves grossly intact. moves all 4 extremities w/o difficulty. Affect pleasant  She is starting to diurese. Creatinine seems to be plateauing. Continue current care. Repeat metolazone. I am hopeful that we can avoid need for HD.   CRITICAL CARE Performed by: Glori Bickers  Total critical care time: 35 minutes  Critical care time was exclusive of separately billable procedures and treating other patients.  Critical care was necessary to treat or prevent imminent or life-threatening deterioration.  Critical care was time spent personally by me (independent of midlevel providers or residents) on the following activities: development of treatment plan with patient and/or surrogate as well as nursing, discussions with consultants, evaluation of patient's response to treatment, examination of patient, obtaining history from patient or surrogate, ordering and performing treatments and interventions, ordering and review of laboratory studies, ordering and review of radiographic studies, pulse oximetry and re-evaluation of patient's condition.  Glori Bickers, MD  9:59 AM

## 2020-05-02 NOTE — Progress Notes (Signed)
Patient c/o heartburn.  [facility] Cardiology new order received.  Malox 30 ml one dose, per Ellwood Handler PA.

## 2020-05-02 NOTE — Progress Notes (Signed)
  Echocardiogram 2D Echocardiogram limited with color, doppler, and definity has been performed.  Darlina Sicilian M 05/02/2020, 3:23 PM

## 2020-05-02 NOTE — Progress Notes (Addendum)
[facility] DAILY ICU PROGRESS NOTE                   [address]            [place] [address]          [phone]   3 Days Post-Op Procedure(s) (LRB): MITRAL VALVE (MV) REPLACEMENT USING ON-X VALVE SIZE 27/29MM (N/A) MAZE (N/A) TRANSESOPHAGEAL ECHOCARDIOGRAM (TEE) (N/A) CLIPPING OF ATRIAL APPENDAGE USING ATRICURE  PRO2 CLIP SIZE 45MM  Total Length of Stay:  LOS: 3 days   Subjective:  Up in chair, doing okay.  She does have some nausea that relieved with medication.  Per nursing she is oozing serous sanguinous drainage from around chest tubes.  + flatus, no BM yet  Objective: Vital signs in last 24 hours: Temp:  [98.3 F (36.8 C)-99.3 F (37.4 C)] 98.3 F (36.8 C) (04/15 0739) Pulse Rate:  [92-109] 94 (04/15 0730) Cardiac Rhythm: Normal sinus rhythm (04/15 0400) Resp:  [13-28] 13 (04/15 0730) SpO2:  [92 %-100 %] 100 % (04/15 0730) Arterial Line BP: (141-198)/(51-68) 154/62 (04/15 0630) FiO2 (%):  [40 %] 40 % (04/15 0312) Weight:  [120.6 kg] 120.6 kg (04/15 0500)  Filed Weights   04/29/20 0553 04/30/20 0520 05/02/20 0500  Weight: 111.3 kg 115.7 kg 120.6 kg    Weight change:    Hemodynamic parameters for last 24 hours: CVP:  [16 mmHg-18 mmHg] 18 mmHg  Intake/Output from previous day: 04/14 0701 - 04/15 0700 In: 701.5 [I.V.:701.5] Out: 1070 [Urine:1070]  Current Meds: Scheduled Meds: . acetaminophen  1,000 mg Oral Q6H  . bisacodyl  10 mg Oral Daily   Or  . bisacodyl  10 mg Rectal Daily  . Chlorhexidine Gluconate Cloth  6 each Topical Daily  . [place] [facility], Patient & Family Education   Does not apply Once  . docusate sodium  200 mg Oral Daily  . insulin aspart  0-24 Units Subcutaneous Q4H  . mouth rinse  15 mL Mouth Rinse BID  . metolazone  5 mg Oral Once  . pantoprazole  40 mg Oral Daily  . sodium chloride flush  10-40 mL Intracatheter Q12H  . sodium chloride flush  3 mL Intravenous Q12H  . warfarin  1 mg Oral q1600  . Warfarin  - Physician Dosing Inpatient   Does not apply q1600   Continuous Infusions: . sodium chloride    . sodium chloride    . DOPamine 3 mcg/kg/min (05/02/20 0700)  . furosemide (LASIX) 200 mg in dextrose 5% 100 mL (2mg /mL) infusion 30 mg/hr (05/02/20 0700)  . lactated ringers    . lactated ringers    . milrinone 0.3 mcg/kg/min (05/02/20 0700)   PRN Meds:.Place/Maintain arterial line **AND** sodium chloride, fentaNYL (SUBLIMAZE) injection, ondansetron (ZOFRAN) IV, sodium chloride flush, sodium chloride flush, traMADol  General appearance: alert, cooperative and no distress Heart: regular rate and rhythm Lungs: diminished breath sounds bibasilar Abdomen: soft, non-tender; bowel sounds normal; no masses,  no organomegaly Extremities: edema trace-1+ Wound: aquacel in place on sternum  Lab Results: CBC: Recent Labs    05/01/20 0328 05/02/20 0409  WBC 18.2* 14.1*  HGB 9.2* 8.1*  HCT 27.6* 24.0*  PLT 145* 130*   BMET:  Recent Labs    05/01/20 0328 05/02/20 0409  NA 135 132*  K 3.7 3.6  CL 100 99  CO2 23 23  GLUCOSE 167* 143*  BUN 33* 47*  CREATININE 3.38* 3.75*  CALCIUM 8.4* 8.6*  CMET: Lab Results  Component Value Date   WBC 14.1 (H) 05/02/2020   HGB 8.1 (L) 05/02/2020   HCT 24.0 (L) 05/02/2020   PLT 130 (L) 05/02/2020   GLUCOSE 143 (H) 05/02/2020   CHOL 204 (H) 03/07/2020   TRIG 224 (H) 03/07/2020   HDL 35 (L) 03/07/2020   LDLCALC 129 (H) 03/07/2020   ALT 54 (H) 05/02/2020   AST 116 (H) 05/02/2020   NA 132 (L) 05/02/2020   K 3.6 05/02/2020   CL 99 05/02/2020   CREATININE 3.75 (H) 05/02/2020   BUN 47 (H) 05/02/2020   CO2 23 05/02/2020   TSH 0.250 (L) 03/07/2020   INR 2.3 (H) 05/02/2020   HGBA1C 6.5 (H) 04/25/2020      PT/INR:  Recent Labs    05/02/20 0409  LABPROT 25.7*  INR 2.3*   Radiology: [facility] Chest Port 1 View  Result Date: 05/02/2020 CLINICAL DATA:  Respiratory distress EXAM: PORTABLE CHEST 1 VIEW COMPARISON:  05/01/2020 FINDINGS:  Mediastinal drain and left chest tube have been removed. Right internal jugular Cordis introducer is unchanged. Lung volumes are small, but are stable. Bibasilar atelectasis persists. Superimposed perihilar pulmonary infiltrate has progressed in the interval in keeping with probable progressive pulmonary edema. No pneumothorax or pleural effusion. Mitral valve replacement and left atrial clipping has been performed. Cardiac size within normal limits. IMPRESSION: Interval mediastinal drain and left chest tube removal. No pneumothorax. Preserved pulmonary hypoinflation. Progressive perihilar pulmonary infiltrate most in keeping with mild pulmonary edema. Electronically Signed   By: Fidela Salisbury MD   On: 05/02/2020 07:35     Assessment/Plan: S/P Procedure(s) (LRB): MITRAL VALVE (MV) REPLACEMENT USING ON-X VALVE SIZE 27/29MM (N/A) MAZE (N/A) TRANSESOPHAGEAL ECHOCARDIOGRAM (TEE) (N/A) CLIPPING OF ATRIAL APPENDAGE USING ATRICURE  PRO2 CLIP SIZE 45MM  1. CV- NSR, + HTN- remains on Milrinone and Dopamine, elevated BP okay to a degree in light of renal issues... AHF following 2. INR 2.3- coumadin held last night, for mechanical MVR 3. Pulm- off BIPAP, continue aggressive pulmonary toilet, CXR with pulmonary edema, pulmonary infiltrate 4. Renal- AKI, creatinine up to 3.75, on Lasix gtt, Nephrology following 5. Expected post operative blood loss anemia- Hgb at 8.1, monitor may need transfusion if Hgb remains low 6. CBGs controlled, continue SSIP 7. Dispo- patient stable, in NSR remains on Dopamine, Milrinone AHF following, AKI creatinine up to 3.75, on Lasix gtt, metolazone added Nephrology following, INR is 2.3 coumadin held yesterday for mechanical AVR, continue current care     Ellwood Handler, PA-C 05/02/2020 7:59 AM    I have seen and examined the patient and agree with the assessment and plan as outlined.  Overall stable, somewhat improved.  UOP increased overnight and creatinine essentially  stable.  Respiratory status stable.  Continue current plan and monitor closely.  Advance diet slowly and work on mobilization.  Restart Coumadin 1 mg daily and watch INR closely - patient required low dose (1.25 mg daily) to maintain therapeutic INR prior to admission.   Rexene Alberts, MD 05/02/2020 8:10 AM

## 2020-05-02 NOTE — Progress Notes (Signed)
P[facility] Cardiology spoke with Dr. Haroldine Laws.  Patient accidentally dislodged artline in left wrist, Applied pressure and dressing.  There was a discrepancy  Between cuff pressures and arline pressures.  Asked if he would like a new artline.  New no orders received.  Blood pressures measured in both arms.

## 2020-05-02 NOTE — Progress Notes (Signed)
T[facility] Evening Rounds  POD #3 s/p MVR Improving today Good UOP Breathing a little better  PE: BP (!) 152/72 (BP Location: Left Arm)   Pulse 98   Temp 98.2 F (36.8 C) (Oral)   Resp 13   Ht 5\' 4"  (1.626 m)   Wt 120.6 kg   SpO2 96%   BMI 45.64 kg/m   Alert Irreg/irreg Diminished BS bases Mild edema  Intake/Output Summary (Last 24 hours) at 05/02/2020 1622 Last data filed at 05/02/2020 1600 Gross per 24 hour  Intake 719.53 ml  Output 1915 ml  Net -1195.47 ml   A/P: warfarin tonight Continue pulm toilet/diuresis Mavin Dyke Z. Orvan Seen, [place]

## 2020-05-02 NOTE — Progress Notes (Signed)
NAME:  Leslie [name], MRN:  [id number], DOB:  [birth year], LOS: 3 ADMISSION DATE:  04/29/2020, CONSULTATION DATE:  05/01/20 REFERRING MD:  Roxy Manns, CHIEF COMPLAINT:  Hypoxic respiratory failure   History of Present Illness:  Ms. 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  She was extubated postoperatively on 4/13.  She subsequently developed hypotension with low Coox and was started on dopamine and milrinone with improvement in her cooximetry.  She has had minimal urine output with rapidly increasing creatinine within normal baseline.  This morning she was started on a Lasix drip with minimal urine output so far.  She has respiratory failure requiring BiPAP to maintain adequate saturations overnight.  She currently only complains of mild sternal chest pain.  She denies any pre-existing history of lung disease, but has reported history of ILD in her chart.  She had PFTs in 2016 with normal FEV1/FVC ratio, reduced FEV1 (74% of predicted), normal total lung capacity by plethysmography with increased RV,  mildly reduced DLCO (flow volume loops and exact numbers not visible, only the interpretation). PCCM has been consulted for management of respiratory failure.   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 4/12 MAZE, mechanical MVR . 4/13 extubated by rapid wean protocol, hypotension requiring dopamine & milrinone . 4/14 requiring BiPAP, started on lasix drip. Still on inotropes.  Interim History / Subjective:  Remains off bipap. Still some sternal pain. Mild  dyspnea.  Objective   Blood pressure (!) 96/48, pulse 100, temperature 98.3 F (36.8 C), temperature source Oral, resp. rate 16, height 5\' 4"  (1.626 m), weight 120.6 kg, SpO2 91 %. CVP:  [16 mmHg-18 mmHg] 18 mmHg  Vent Mode: PSV;BIPAP FiO2 (%):  [40 %] 40 % PEEP:  [6 cmH20] 6 cmH20 Pressure Support:  [8 cmH20] 8 cmH20   Intake/Output Summary (Last 24 hours) at 05/02/2020 0821 Last data filed at 05/02/2020 0800 Gross per 24 hour  Intake 673.64 ml  Output 1070 ml  Net -396.36 ml   Filed Weights   04/29/20 0553 04/30/20 0520 05/02/20 0500  Weight: 111.3 kg 115.7 kg 120.6 kg   UOP 1070  Examination: General: middle aged woman sitting in the recliner, sleeping but easily arousable. HENT: [place]/AT, eyes anicteric.  Lungs: Minimal rhales, no tachypnea or conversational dyspnea. Cardiovascular: S1S2, RRR, mechanical click. Abdomen: obese, soft, NT Extremities: mild pretibial edema, no clubbing or cyanosis. Neuro: arouses easily, moving all extremities. GU: foley in place, clearer urine output today.  Labs/imaging that I have personally reviewed  (right click and "Reselect all SmartList Selections" daily)   CXR> pulmonary edema persists  WBC 14 H/H  8.1/ 24 Platelets 130 BUN 47 Creatinine 3.75 INR 2.3  Resolved Hospital Problem list     Assessment & Plan:   Acute respiratory failure with hypoxia Acute cardiogenic pulmonary edema --Con't BiPAP PRN; hopefully she will no longer require. --Diuresis --Con't inotropic support- dopamine and milrinone -No previous PFTs.  No history of obstructive lung disease, likely no role for bronchodilators. -Titrate O2 as able to maintain SpO2 >90%. --Questionable history several years ago of ILD, but  this was apparently based on CXR and could have been more related to atelectasis. She had no restriction on PFTs in 2016.  Cardiogenic shock requiring inotropic support MS s/p mechanical MVR, MAZE PH, WHO group II -Serial  cooximetry -Continue dopamine and milrinone -Holding Coumadin per primary -con't diuresis -strict I/Os  Oliguric AKI, suspect due to hypoperfusion -Appreciate nephrology's input --metolazone and lasix; con't monitoring I/Os -renally dose meds, avoid nephrotoxic meds  Thrombocytopenia, suspect this is post-op -monitor -no indication for transfusion at this time  Acute blood loss anemia from operative blood loss -transfuse for Hb<7 or hemodynamically significant bleeding  Elevated transaminases likely due to heart failure -monitor -con't inotropes  Husband updated at bedside.   Best practice (right click and "Reselect all SmartList Selections" daily)  Diet:  Oral Pain/Anxiety/Delirium protocol (if indicated): No VAP protocol (if indicated): Not indicated DVT prophylaxis: Systemic AC GI prophylaxis: N/A Glucose control:  SSI Yes Central venous access:  N/A Arterial line:  Yes, and it is still needed Foley:  Yes, and it is still needed Mobility:  OOB  PT consulted: N/A Last date of multidisciplinary goals of care discussion [4/15] Code Status:  full code Disposition: ICU  Labs   CBC: Recent Labs  Lab 04/29/20 2016 04/29/20 2308 04/30/20 0420 04/30/20 0610 04/30/20 1654 04/30/20 2026 04/30/20 2201 05/01/20 0328 05/02/20 0409  WBC 12.9*  --  16.2*  --  19.1*  --   --  18.2* 14.1*  HGB 9.2*   < > 8.7*   < > 8.4* 7.8* 7.8* 9.2* 8.1*  HCT 28.1*   < > 26.5*   < > 25.8* 23.0* 23.0* 27.6* 24.0*  MCV 89.8  --  89.5  --  91.5  --   --  87.3 87.0  PLT 166  --  164  --  151  --   --  145* 130*   < > = values in this interval not displayed.    Basic Metabolic Panel: Recent Labs  Lab 04/29/20 2016 04/29/20 2308 04/30/20 0420 04/30/20 0610 04/30/20 1654 04/30/20 2026 04/30/20 2201 05/01/20 0328 05/02/20 0409  NA 139   < > 140   < > 134* 134* 136 135 132*  K 4.3   < > 3.7   < > 3.9 3.8 3.8 3.7 3.6  CL 111  --  110  --  104  --   --  100 99  CO2 23  --  22  --   22  --   --  23 23  GLUCOSE 160*  --  146*  --  193*  --   --  167* 143*  BUN 16  --  19  --  26*  --   --  33* 47*  CREATININE 1.41*  --  1.75*  --  2.66*  --   --  3.38* 3.75*  CALCIUM 7.2*  --  7.7*  --  8.1*  --   --  8.4* 8.6*  MG 3.5*  --  3.2*  --  2.9*  --   --   --   --    < > = values in this interval not displayed.    This patient is critically ill with multiple organ system failure which requires frequent high complexity decision making, assessment, support, evaluation, and titration of therapies. This was completed through the application of advanced monitoring technologies and extensive interpretation of multiple databases. During this encounter critical care time was devoted to patient care services described  in this note for 33 minutes.   Julian Hy, DO 05/02/20 12:41 PM [facility]

## 2020-05-02 NOTE — Discharge Summary (Signed)
[address]       [place] [address]             [phone]    Physician Discharge Summary  Patient ID: MALLIKA SANMIGUEL MRN: [id number] DOB/AGE: 1965/09/22 55 y.o.  Admit date: 04/29/2020 Discharge date: 05/11/2020  Admission Diagnoses:  Patient Active Problem List   Diagnosis Date Noted  . BMI 39.0-39.9,adult 09/30/2019  . 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 Right knee pain 11/27/2018  . GERD (gastroesophageal reflux disease) 10/25/2018  . CKD (chronic kidney disease), stage III ([place]) 10/25/2018  . CHF (congestive heart failure) ([place]) 10/25/2018  . Absolute anemia 10/01/2018  . Iron deficiency anemia secondary to blood loss (chronic)   . Polyp of descending colon   . (HFpEF) heart failure with preserved ejection fraction ([place])   . Renal calculus, right 07/03/2018  . Low back pain 06/27/2018  . Pedal edema 06/27/2018  . Abnormal renal function 02/16/2018  . Acute recurrent pansinusitis 01/13/2018  . Anxiety 01/13/2018  . Osteoarthritis of knee 01/09/2018  . Morbid obesity with body mass index (BMI) of 40.0 or higher ([place]) 11/13/2017  . Major depressive disorder 09/22/2017  . Mitral valve stenosis   . Atrial fibrillation ([place]) 09/11/2017  . Hypertension 06/06/2017  . Tobacco dependency 06/06/2017  . Acute upper respiratory infection 04/28/2017  . Pain of left lower extremity 02/11/2016  . 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 Fibrocystic breast disease 03/23/2013   Discharge Diagnoses:  Patient Active Problem List   Diagnosis Date Noted  . S/P mitral valve replacement with Onyx bileaflet mechanical valve 04/29/2020  . S/P Maze operation for atrial fibrillation 04/29/2020  . S/P MVR (mitral valve replacement) 04/29/2020  . BMI 39.0-39.9,adult 09/30/2019  . 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 Right knee pain 11/27/2018  . GERD (gastroesophageal reflux disease) 10/25/2018  . CKD (chronic kidney disease), stage  III ([place]) 10/25/2018  . CHF (congestive heart failure) ([place]) 10/25/2018  . Absolute anemia 10/01/2018  . Iron deficiency anemia secondary to blood loss (chronic)   . Polyp of descending colon   . (HFpEF) heart failure with preserved ejection fraction ([place])   . Renal calculus, right 07/03/2018  . Low back pain 06/27/2018  . Pedal edema 06/27/2018  . Abnormal renal function 02/16/2018  . Acute recurrent pansinusitis 01/13/2018  . Anxiety 01/13/2018  . Osteoarthritis of knee 01/09/2018  . Morbid obesity with body mass index (BMI) of 40.0 or higher ([place]) 11/13/2017  . Major depressive disorder 09/22/2017  . Mitral valve stenosis   . Atrial fibrillation ([place]) 09/11/2017  . Hypertension 06/06/2017  . Tobacco dependency 06/06/2017  . Acute upper respiratory infection 04/28/2017  . Pain of left lower extremity 02/11/2016  . 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 Fibrocystic breast disease 03/23/2013   History of Present Illness:  Patient is 55 year old morbidly obese African-American female with history of mitral stenosis, chronic diastolic congestive heart failure, persistent atrial fibrillation currently maintaining sinus rhythm on long-term warfarin anticoagulation, hypertension, hyperlipidemia, GE reflux disease, kidney stones, and possible interstitial lung disease who has been referred for surgical consultation to discuss treatment options for management of severe symptomatic mitral stenosis.   Patient reports being told that she had a heart murmur many years ago.  She denies any known history of rheumatic fever or rheumatic heart disease.  She reports a long history of intermittent tachypalpitations and exertional shortness of breath dating back several years.  In August 2019 she was hospitalized with atrial fibrillation with rapid ventricular response and acute exacerbation of likely chronic diastolic congestive heart failure.  She spontaneously converted back to sinus  rhythm during her hospitalization during which time she underwent TEE that confirmed the presence of likely rheumatic mitral valve disease with at least moderate mitral stenosis.  She was started on warfarin anticoagulation and has been followed intermittently ever since by Dr. Rockey Situ.  She has had episodes of intermittent tachypalpitations associated with worsening shortness of breath that have increased in frequency and severity in recent months.  She has developed progressive symptoms of exertional shortness of breath and fluid retention requiring escalating doses of diuretic therapy.  Recent follow-up transthoracic echocardiogram performed June 27, 2019 revealed findings consistent with further progression in the severity of mitral stenosis with mean transvalvular gradient estimated 10 mmHg at a heart rate of 66 bpm.  Gross appearance of the mitral valve appeared consistent with rheumatic disease with mitral valve area by planimetry measured only 1.05 cm.  Left ventricular systolic function remained normal with ejection fraction estimated 65 to 70%.  The patient subsequently underwent left and right heart catheterization September 06, 2019.  Catheterization revealed normal coronary artery anatomy with no significant coronary artery disease.  There was moderate pulmonary hypertension with PA pressures measured 52/26 and pulmonary capillary wedge pressure ranging between 26 and 31 mmHg.  Cardiothoracic surgical consultation was requested.   She was evaluated by Dr. Leafy Kindle at which time she admitted that over the last 6 months she has gotten to the point where she cannot do much of anything without getting short of breath.  She cannot go up a flight of stairs without having to stop and take a break.  She has had some lower extremity edema requiring increasing doses of oral diuretic.  She recently had a prolonged episode of tachypalpitations associated with resting shortness of breath and severe orthopnea.  Symptoms  lasted for several hours prompting her to take extra doses of metoprolol.  Symptoms eventually subsided when her pulse returned to normal.  Intermittent palpitations have increased in frequency in recent months, but the patient also notes that she gets short of breath even when her heart rate is slow and regular.  She subsequently underwent transesophageal echocardiogram on April 01, 2020 which confirmed the presence of rheumatic heart disease with severe mitral stenosis.   It was felt the patient would benefit from Mitral Valve Replacement and MAZE procedure.  The risks and benefits of the procedure were explained to the patient and she is agreeable to proceed.     Hospital Course:  Mrs. Neis presented to [facility].  She was taken to the operating room and underwent Median Sternotomy, Mitral Valve Replacement, and Complete MAZE procedure.  She tolerated the procedure without difficulty and was taken to the SICU in stable condition.  During her stay in the SICU the patient was weaned and extubated on POD #1.  She was weaned off Milrinone as hemodynamics allowed.  Her chest tubes and arterial lines were removed without difficulty.  She is not a diabetic and was transitioned off insulin drip to sliding scale coverage.  She was initiated on coumadin therapy for her mechanical mitral valve.  She was on this prior to admission and required a very low daily dose of 1.25 mg.  She developed hematuria, which was monitored.  Unfortunately, she became oliguric and her creatinine level became elevated with level peaking at 3.75.  She was started on  Dopamine for this and her Milrinone dose was increased.  CXR showed evidence of CHF and atelectasis.  Her lasix dose was increased.  She was placed on BIPAP due to hypoxia. Critical Care, Advanced Heart Failure, and Nephrology consults were obtained.  Her chest tubes were removed without difficulty on POD #2.  She was started on a lasix gtt and her urinary output  increased.  She was also given some doses of Metolazone to aide with diuresis.    The patient has continued to make very steady progress over the next several days.  Renal function has shown a steady improvement and advanced heart failure as well as nephrology have made adjustments of diuretics throughout the hospital course.  She has had some sinus tachycardia and beta-blocker has been reinitiated.  INR has been monitored daily and more Coumadin dosing has been adjusted.  She did develop a symptomatic for anemia and was subsequently transfused with improvement in her overall feeling of wellbeing.  She has also been started on iron supplementation.  She had had some weakness and dizziness which is subsequently resolved.  Milrinone continues to be slowly weaned and was discontinued on 05/09/2020.Marland Kitchen  Cooximetry is showing a steady improvement.  Her most recent BUN and creatinine are 46/1.95 on 05/09/2020 and continue to show a steady improvement over time.  Her anemia is felt to be very stable with most recent hemoglobin hematocrit on 05/08/2020 noted to be 9.2 and 27.8 respectively.  Her hyponatremia is almost completely resolved with most recent sodium of 134.  Her most recent INR on 05/11/2020 is 2.0 and she is currently receiving 5 mg of Coumadin daily. Discharge was planned for 4/23 but delayed due to hypokalemia (K+ 2.5).  She was given supplements and lab was repeated prior to discharge to assure appropriate response.    Discharged Condition: stable    Consults: pulmonary/intensive care, nephrology and Advanced Heart Failure  Significant Diagnostic Studies: cardiac graphics:   Echocardiogram:  IMPRESSIONS    1. Severe mitral valve stenosis. MVA by PISA 1 cm2. MVA by PHT 1.34 cm2,  average MVA by planimetry 1.65 cm2, MVA by continuity equation 1.16 cm2.  The mitral valve is rheumatic. Mild mitral valve regurgitation. Severe  mitral stenosis. The mean mitral  valve gradient is 12.5 mmHg with  average heart rate of 83 bpm. Wilkins  score is approximately 5-6. There is normal mobility of leaflet mid and  base, normal leaflet thickness, minimal leaflet calcifications.  Subvalvular apparatus appears mildly thickened  (not optimally visualized).  2. Left ventricular ejection fraction, by estimation, is 60 to 65%. The  left ventricle has normal function.  3. Right ventricular systolic function is normal. The right ventricular  size is mildly enlarged.  4. No mural thrombus in LA. Left atrial size was severely dilated. No  left atrial/left atrial appendage thrombus was detected.  5. Right atrial size was mild to moderately dilated.  6. Tricuspid valve regurgitation is mild to moderate.  7. The aortic valve is tricuspid. Aortic valve regurgitation is not  visualized. No aortic stenosis is present.  8. There is mild (Grade II) atheroma plaque involving the transverse and  descending aorta.  9. Agitated saline contrast bubble study was negative, with no evidence  of any interatrial shunt.   Treatments: surgery:    Preoperative Diagnosis:        Rheumatic Heart Disease  Severe Mitral Stenosis  Recurrent Paroxysmal Atrial Fibrillation  Postoperative Diagnosis:    Same  Procedure:  Mitral Valve Replacement              Onyx Bileaflet Mechanical Valve (size 27/53mm, REF #[id number], serial #[id number])   Maze Procedure              Complete bilateral atrial lesion set using bipolar radiofrequency and cryothermy ablation             Clipping of left atrial appendage (Atricure Pro245 left atrial clip, size 66mm)               Surgeon:        Valentina Gu. Roxy Manns, MD  Assistant:       Ellwood Handler, PA-C   Discharge Exam: Blood pressure (!) 159/79, pulse 76, temperature 98.1 F (36.7 C), temperature source Oral, resp. rate 17, height 5\' 4"  (1.626 m), weight 107.9 kg, SpO2 98 %.  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 Lungs: CTA bilaterally Abdomen: benign Extremities: trace LE edema Wound: the sternal incision is healing well   Discharge Medications:  The patient has been discharged on:   1.Beta Blocker:  Yes [  y ]                              No   [   ]                              If No, reason:  2.Ace Inhibitor/ARB: Yes [   ]                                     No  [  n  ]                                     If No, reason:renal insuff  3.Statin:   Yes Blue.Reese   ]                  No  [   ]                  If No, reason:  4.Ecasa:  Yes  [ x  ]                  No   [  ]                  If No, reason:   Discharge Instructions    Amb Referral to Cardiac Rehabilitation   Complete by: As directed    Diagnosis: Valve Replacement   Valve: Mitral   After initial evaluation and assessments completed: Virtual Based Care may be provided alone or in conjunction with Phase 2 Cardiac Rehab based on patient barriers.: Yes     Allergies as of 05/11/2020      Reactions   Morphine Hives, Rash   Oxycodone Hcl Hives, Itching   Dilaudid [hydromorphone Hcl] Itching      Medication List    STOP taking these medications   ALPRAZolam 0.5 MG tablet Commonly known as: XANAX   chlorpheniramine-HYDROcodone 10-8 MG/5ML Suer Commonly known as: Tussionex Pennkinetic ER   diltiazem 120 MG 24 hr capsule Commonly known as: CARDIZEM CD  enoxaparin 120 MG/0.8ML injection Commonly known as: Lovenox   metoprolol succinate 50 MG 24 hr tablet Commonly known as: TOPROL-XL   omeprazole 40 MG capsule Commonly known as: PRILOSEC   potassium chloride 10 MEQ tablet Commonly known as: KLOR-CON     TAKE these medications   acetaminophen 500 MG tablet Commonly known as: TYLENOL Take 1,000 mg by mouth every 6 (six) hours as needed for moderate pain.   albuterol 108 (90 Base) MCG/ACT inhaler Commonly known as: VENTOLIN HFA Inhale 2 puffs into the lungs every 6 (six) hours as needed for wheezing or  shortness of breath.   amLODipine 10 MG tablet Commonly known as: NORVASC Take 1 tablet (10 mg total) by mouth daily.   amoxicillin 500 MG tablet Commonly known as: AMOXIL Take 2,000 mg by mouth See admin instructions. Take 4 capsules (2000 mg) by mouth 1 hour prior to dental work   aspirin EC 81 MG tablet Take 1 tablet (81 mg total) by mouth daily. Swallow whole.   atorvastatin 80 MG tablet Commonly known as: LIPITOR Take 1 tablet (80 mg total) by mouth every evening. What changed: See the new instructions.   calcium carbonate 500 MG chewable tablet Commonly known as: TUMS - dosed in mg elemental calcium Chew 500 mg by mouth daily as needed for indigestion or heartburn.   ferrous [id number] C-folic acid capsule Commonly known as: TRINSICON / FOLTRIN Take 1 capsule by mouth 2 (two) times daily after a meal.   fluticasone 50 MCG/ACT nasal spray Commonly known as: FLONASE Place 1 spray into both nostrils daily as needed for rhinitis.   metoprolol tartrate 25 MG tablet Commonly known as: LOPRESSOR Take 1 tablet (25 mg total) by mouth 2 (two) times daily.   potassium chloride SA 20 MEQ tablet Commonly known as: KLOR-CON Take 2 tablets (40 mEq total) by mouth daily.   spironolactone 25 MG tablet Commonly known as: ALDACTONE Take 0.5 tablets (12.5 mg total) by mouth daily.   torsemide 20 MG tablet Commonly known as: DEMADEX TAKE 2 TABLETS(40 MG) BY MOUTH TWICE DAILY What changed: See the new instructions.   traMADol 50 MG tablet Commonly known as: ULTRAM Take 1 tablet (50 mg total) by mouth every 6 (six) hours as needed for up to 7 days for moderate pain.   warfarin 5 MG tablet Commonly known as: COUMADIN Take as directed. If you are unsure how to take this medication, talk to your nurse or doctor. Original instructions: Take 1 tablet (5 mg total) by mouth daily at 4 PM. As instructed by Coumadin clinic for lifetime What changed:   medication  strength  See the new instructions.            Durable Medical Equipment  (From admission, onward)         Start     Ordered   05/09/20 1117  For home use only DME Walker rolling  Once       Question Answer Comment  Walker: With [place] Wheels   Patient needs a walker to treat with the following condition Imbalance      05/09/20 1119   05/08/20 0945  For home use only DME Walker rolling  Once       Question Answer Comment  Walker: With 5 Inch Wheels   Patient needs a walker to treat with the following condition Weakness      05/08/20 0944          Follow-up Information  McDonough, Lauren K, PA-C Follow up.   Specialty: Physician Assistant Why: Hospital follow up scheduled for Monday 05/12/20 at 2:30pm with Drema Dallas PA-C Contact information: [address] [place] [address] [phone]        [facility] Follow up.   Specialty: [place] Why: HHPT/RN arranged- they will contact you to set up home visits- [phone])       [facility] Follow up.   Why: rolling walker arranged- to be delivered to room prior to discharge Contact information: [place] [address] [phone]        [facility]. Go on 05/14/2020.   Specialty: Cardiology Why: YOUR APPOINTMENT FOR PT/INR BLOOD TEST FOR COUMADIN IS AT 3:45PM. Contact information: [address], [address] [place]       Rexene Alberts, MD. Go on 05/26/2020.   Specialty: Cardiothoracic Surgery Why: YOUR APPOINTMENT IS AT 1:30.  PLEASE ARRIVE 30 MIN EARLY FOR A CHEST X-RAY TO BE PERFORMED BY [facility] LOCATED ON THE FIRST FLOOR OF THE SAME BUILDING Contact information: [address] [place] [address] [phone]               Signed: Antony Odea, PA-C 05/11/2020, 11:38 AM

## 2020-05-02 NOTE — Progress Notes (Signed)
Patient nauseated.  Administered 4mg  zofran as per MD orders.

## 2020-05-03 ENCOUNTER — Inpatient Hospital Stay (HOSPITAL_COMMUNITY): Payer: 59

## 2020-05-03 DIAGNOSIS — R570 Cardiogenic shock: Secondary | ICD-10-CM

## 2020-05-03 DIAGNOSIS — I342 Nonrheumatic mitral (valve) stenosis: Secondary | ICD-10-CM | POA: Diagnosis not present

## 2020-05-03 DIAGNOSIS — J9601 Acute respiratory failure with hypoxia: Secondary | ICD-10-CM | POA: Diagnosis not present

## 2020-05-03 DIAGNOSIS — I5031 Acute diastolic (congestive) heart failure: Secondary | ICD-10-CM | POA: Diagnosis not present

## 2020-05-03 DIAGNOSIS — N179 Acute kidney failure, unspecified: Secondary | ICD-10-CM | POA: Diagnosis not present

## 2020-05-03 DIAGNOSIS — Z954 Presence of other heart-valve replacement: Secondary | ICD-10-CM | POA: Diagnosis not present

## 2020-05-03 LAB — TYPE AND SCREEN
ABO/RH(D): A POS
Antibody Screen: NEGATIVE
Unit division: 0
Unit division: 0
Unit division: 0
Unit division: 0

## 2020-05-03 LAB — COOXEMETRY PANEL
Carboxyhemoglobin: 0.8 % (ref 0.5–1.5)
Carboxyhemoglobin: 0.8 % (ref 0.5–1.5)
Methemoglobin: 1.3 % (ref 0.0–1.5)
Methemoglobin: 1.2 % (ref 0.0–1.5)
O2 Saturation: 50.7 %
O2 Saturation: 53.4 %
Total hemoglobin: 8 g/dL — ABNORMAL LOW (ref 12.0–16.0)
Total hemoglobin: 11.3 g/dL — ABNORMAL LOW (ref 12.0–16.0)

## 2020-05-03 LAB — BPAM RBC
Blood Product Expiration Date: 202205012359
Blood Product Expiration Date: 202205052359
Blood Product Expiration Date: 202205052359
Blood Product Expiration Date: 202205052359
ISSUE DATE / TIME: 202204082034
ISSUE DATE / TIME: 202204121102
ISSUE DATE / TIME: 202204121102
ISSUE DATE / TIME: 202204132320
Unit Type and Rh: 6200
Unit Type and Rh: 6200
Unit Type and Rh: 6200
Unit Type and Rh: 6200

## 2020-05-03 LAB — CBC
HCT: 23 % — ABNORMAL LOW (ref 36.0–46.0)
Hemoglobin: 7.7 g/dL — ABNORMAL LOW (ref 12.0–15.0)
MCH: 29.4 pg (ref 26.0–34.0)
MCHC: 33.5 g/dL (ref 30.0–36.0)
MCV: 87.8 fL (ref 80.0–100.0)
Platelets: 150 10*3/uL (ref 150–400)
RBC: 2.62 MIL/uL — ABNORMAL LOW (ref 3.87–5.11)
RDW: 14.9 % (ref 11.5–15.5)
WBC: 11.2 10*3/uL — ABNORMAL HIGH (ref 4.0–10.5)
nRBC: 0.5 % — ABNORMAL HIGH (ref 0.0–0.2)

## 2020-05-03 LAB — GLUCOSE, CAPILLARY
Glucose-Capillary: 106 mg/dL — ABNORMAL HIGH (ref 70–99)
Glucose-Capillary: 118 mg/dL — ABNORMAL HIGH (ref 70–99)
Glucose-Capillary: 128 mg/dL — ABNORMAL HIGH (ref 70–99)
Glucose-Capillary: 129 mg/dL — ABNORMAL HIGH (ref 70–99)
Glucose-Capillary: 132 mg/dL — ABNORMAL HIGH (ref 70–99)
Glucose-Capillary: 140 mg/dL — ABNORMAL HIGH (ref 70–99)

## 2020-05-03 LAB — PROTIME-INR
INR: 2.2 — ABNORMAL HIGH (ref 0.8–1.2)
Prothrombin Time: 24.2 seconds — ABNORMAL HIGH (ref 11.4–15.2)

## 2020-05-03 LAB — BASIC METABOLIC PANEL
Anion gap: 13 (ref 5–15)
BUN: 62 mg/dL — ABNORMAL HIGH (ref 6–20)
CO2: 28 mmol/L (ref 22–32)
Calcium: 8.5 mg/dL — ABNORMAL LOW (ref 8.9–10.3)
Chloride: 89 mmol/L — ABNORMAL LOW (ref 98–111)
Creatinine, Ser: 3.02 mg/dL — ABNORMAL HIGH (ref 0.44–1.00)
GFR, Estimated: 18 mL/min — ABNORMAL LOW (ref >60)
Glucose, Bld: 129 mg/dL — ABNORMAL HIGH (ref 70–99)
Potassium: 2.8 mmol/L — ABNORMAL LOW (ref 3.5–5.1)
Sodium: 130 mmol/L — ABNORMAL LOW (ref 135–145)

## 2020-05-03 LAB — COMPREHENSIVE METABOLIC PANEL
ALT: 49 U/L — ABNORMAL HIGH (ref 0–44)
AST: 73 U/L — ABNORMAL HIGH (ref 15–41)
Albumin: 2.8 g/dL — ABNORMAL LOW (ref 3.5–5.0)
Alkaline Phosphatase: 101 U/L (ref 38–126)
Anion gap: 13 (ref 5–15)
BUN: 57 mg/dL — ABNORMAL HIGH (ref 6–20)
CO2: 25 mmol/L (ref 22–32)
Calcium: 8.6 mg/dL — ABNORMAL LOW (ref 8.9–10.3)
Chloride: 93 mmol/L — ABNORMAL LOW (ref 98–111)
Creatinine, Ser: 3.44 mg/dL — ABNORMAL HIGH (ref 0.44–1.00)
GFR, Estimated: 15 mL/min — ABNORMAL LOW (ref >60)
Glucose, Bld: 122 mg/dL — ABNORMAL HIGH (ref 70–99)
Potassium: 3.2 mmol/L — ABNORMAL LOW (ref 3.5–5.1)
Sodium: 131 mmol/L — ABNORMAL LOW (ref 135–145)
Total Bilirubin: 0.6 mg/dL (ref 0.3–1.2)
Total Protein: 6.4 g/dL — ABNORMAL LOW (ref 6.5–8.1)

## 2020-05-03 MED ORDER — POTASSIUM CHLORIDE CRYS ER 20 MEQ PO TBCR: 20.0000 meq | EXTENDED_RELEASE_TABLET | Freq: Two times a day (BID) | ORAL | Status: DC

## 2020-05-03 MED ORDER — POTASSIUM CHLORIDE CRYS ER 20 MEQ PO TBCR
20.0000 meq | EXTENDED_RELEASE_TABLET | Freq: Two times a day (BID) | ORAL | Status: DC
  Administered 2020-05-03: 20 meq via ORAL
  Filled 2020-05-03: qty 1

## 2020-05-03 MED ORDER — POTASSIUM CHLORIDE CRYS ER 20 MEQ PO TBCR
20.0000 meq | EXTENDED_RELEASE_TABLET | Freq: Once | ORAL | Status: AC
  Administered 2020-05-03: 20 meq via ORAL
  Filled 2020-05-03: qty 1

## 2020-05-03 MED ORDER — POTASSIUM CHLORIDE CRYS ER 20 MEQ PO TBCR
20.0000 meq | EXTENDED_RELEASE_TABLET | ORAL | Status: AC
  Administered 2020-05-03 – 2020-05-04 (×3): 20 meq via ORAL
  Filled 2020-05-03 (×3): qty 1

## 2020-05-03 MED ORDER — SORBITOL 70 % SOLN
30.0000 mL | Freq: Once | Status: AC
  Administered 2020-05-03: 30 mL via ORAL
  Filled 2020-05-03: qty 30

## 2020-05-03 NOTE — Progress Notes (Signed)
Nephrology Follow-Up Consult note   Assessment/Recommendations: Leslie [name] is a/an 55 y.o. female with a past medical history significant for mitral stenosis, CKD, nephrolithiasis, admitted for mitral stenosis s/p MVR w/ MAZE and LA appendage clipping on 4/12.  Course has been complicated by AKI.     Non-Oliguric AKI, severe, improving: Likely secondary to ATN from hypoperfusion during surgery.  No signs of obvious obstruction.  Creatinine now improving also with excellent urine output -Continue Lasix infusion 30 mg/h; no metolazone today; consider switching to bolus dosing tomorrow -No acute need for dialysis at this time, continue to monitor -Continue to monitor daily Cr, Dose meds for GFR -Monitor Daily I/Os, Daily weight  -Maintain MAP>65 for optimal renal perfusion.  -Avoid nephrotoxic medications including NSAIDs and Vanc/Zosyn combo  Volume Status: Appears volume overloaded with chest x-ray demonstrating some pulmonary edema.  Mild improvement w/ excellent UOP yesterday.  Continue diuresis as above.  Mitral valve stenosis: s/p repair. Surgery managing  Shock: BP acceptable. Currently on dopamine and milrinone per primary.  Continue to monitor blood pressures  Anemia: Likely multifactorial with acute illness contributing.  Hemoglobin 7.7.  Management per primary and transfuse prn  Hypokalemia: standing oral replacement. BID lab checks with aggressive diuresis   Recommendations conveyed to primary service.    [facility] 05/03/2020 8:22 AM  ___________________________________________________________  CC: AKI on CKD  Interval History/Subjective: Patient stable over the past 24 hours.  Continues on dopamine and milrinone.  Urine output improved significantly with 3 L made.  Creatinine slightly improved.   Medications:  Current Facility-Administered Medications  Medication Dose Route Frequency Provider Last Rate Last Admin  . 0.9 %   sodium chloride infusion  250 mL Intravenous Continuous Barrett, Erin R, PA-C      . 0.9 %  sodium chloride infusion   Intra-arterial PRN Rexene Alberts, MD      . acetaminophen (TYLENOL) tablet 1,000 mg  1,000 mg Oral Q6H Barrett, Erin R, PA-C   1,000 mg at 05/03/20 0609  . bisacodyl (DULCOLAX) EC tablet 10 mg  10 mg Oral Daily Barrett, Erin R, PA-C   10 mg at 05/02/20 1044   Or  . bisacodyl (DULCOLAX) suppository 10 mg  10 mg Rectal Daily Barrett, Erin R, PA-C      . Chlorhexidine Gluconate Cloth 2 % PADS 6 each  6 each Topical Daily Rexene Alberts, MD   6 each at 05/02/20 1454  . docusate sodium (COLACE) capsule 200 mg  200 mg Oral Daily Barrett, Erin R, PA-C   200 mg at 05/02/20 1045  . DOPamine (INTROPIN) 800 mg in dextrose 5 % 250 mL (3.2 mg/mL) infusion  3 mcg/kg/min Intravenous Titrated Rexene Alberts, MD 6.51 mL/hr at 05/03/20 0600 3 mcg/kg/min at 05/03/20 0600  . fentaNYL (SUBLIMAZE) injection 50-100 mcg  50-100 mcg Intravenous Q1H PRN Barrett, Erin R, PA-C   50 mcg at 05/02/20 0426  . furosemide (LASIX) 200 mg in dextrose 5 % 100 mL (2 mg/mL) infusion  30 mg/hr Intravenous Continuous Bensimhon, Shaune Pascal, MD 15 mL/hr at 05/03/20 0600 30 mg/hr at 05/03/20 0600  . insulin aspart (novoLOG) injection 0-24 Units  0-24 Units Subcutaneous Q4H Rexene Alberts, MD   2 Units at 05/03/20 [phone]  . lactated ringers infusion   Intravenous Continuous Barrett, Erin R, PA-C      . lactated ringers infusion   Intravenous Continuous Barrett, Erin R, PA-C      . MEDLINE mouth  rinse  15 mL Mouth Rinse BID Rexene Alberts, MD   15 mL at 05/02/20 2303  . milrinone (PRIMACOR) 20 MG/100 ML (0.2 mg/mL) infusion  0.3 mcg/kg/min Intravenous Continuous Rexene Alberts, MD 10.02 mL/hr at 05/03/20 0600 0.3 mcg/kg/min at 05/03/20 0600  . ondansetron (ZOFRAN) injection 4 mg  4 mg Intravenous Q6H PRN Barrett, Erin R, PA-C   4 mg at 05/02/20 1127  . pantoprazole (PROTONIX) EC tablet 40 mg  40 mg Oral Daily Barrett,  Erin R, PA-C   40 mg at 05/02/20 1045  . polyethylene glycol (MIRALAX / GLYCOLAX) packet 17 g  17 g Oral Daily Noemi Chapel P, DO   17 g at 05/02/20 1045  . potassium chloride SA (KLOR-CON) CR tablet 20 mEq  20 mEq Oral Once Reesa Chew, MD      . potassium chloride SA (KLOR-CON) CR tablet 20 mEq  20 mEq Oral BID Reesa Chew, MD      . sodium chloride flush (NS) 0.9 % injection 10-40 mL  10-40 mL Intracatheter Q12H Rexene Alberts, MD   10 mL at 05/02/20 2303  . sodium chloride flush (NS) 0.9 % injection 10-40 mL  10-40 mL Intracatheter PRN Rexene Alberts, MD      . sodium chloride flush (NS) 0.9 % injection 3 mL  3 mL Intravenous Q12H Barrett, Erin R, PA-C   3 mL at 05/02/20 2301  . sodium chloride flush (NS) 0.9 % injection 3 mL  3 mL Intravenous PRN Barrett, Erin R, PA-C      . traMADol (ULTRAM) tablet 50-100 mg  50-100 mg Oral Q4H PRN Barrett, Erin R, PA-C   100 mg at 04/30/20 1701  . warfarin (COUMADIN) tablet 1 mg  1 mg Oral q1600 Rexene Alberts, MD   1 mg at 05/02/20 1615  . Warfarin - Physician Dosing Inpatient   Does not apply q1600 Rexene Alberts, MD   Given at 05/02/20 1615      Review of Systems: 10 systems reviewed and negative except per interval history/subjective  Physical Exam: Vitals:   05/03/20 0730 05/03/20 0744  BP:    Pulse: 92   Resp: (!) 22   Temp:  98.6 F (37 C)  SpO2: 100%    No intake/output data recorded.  Intake/Output Summary (Last 24 hours) at 05/03/2020 9741 Last data filed at 05/03/2020 0600 Gross per 24 hour  Intake 1093.03 ml  Output 3335 ml  Net -2241.97 ml   Constitutional: sitting in chair, no distress ENMT: ears and nose without scars or lesions, MMM CV: normal rate, trace bilateral LEE Respiratory: Bilateral chest rise with mild increased work of breathing Gastrointestinal: soft, non-tender, no palpable masses or hernias Skin: no visible lesions or rashes Psych: alert, judgement/insight appropriate, appropriate mood  and affect   Test Results I personally reviewed new and old clinical labs and radiology tests Lab Results  Component Value Date   NA 131 (L) 05/03/2020   K 3.2 (L) 05/03/2020   CL 93 (L) 05/03/2020   CO2 25 05/03/2020   BUN 57 (H) 05/03/2020   CREATININE 3.44 (H) 05/03/2020   CALCIUM 8.6 (L) 05/03/2020   ALBUMIN 2.8 (L) 05/03/2020   PHOS 3.3 03/26/2020

## 2020-05-03 NOTE — Progress Notes (Signed)
4 Days Post-Op Procedure(s) (LRB): MITRAL VALVE (MV) REPLACEMENT USING ON-X VALVE SIZE 27/29MM (N/A) MAZE (N/A) TRANSESOPHAGEAL ECHOCARDIOGRAM (TEE) (N/A) CLIPPING OF ATRIAL APPENDAGE USING ATRICURE  PRO2 CLIP SIZE 45MM Subjective: No complaints  Objective: Vital signs in last 24 hours: Temp:  [97.3 F (36.3 C)-98.7 F (37.1 C)] 98.6 F (37 C) (04/16 0744) Pulse Rate:  [86-100] 92 (04/16 0730) Cardiac Rhythm: Normal sinus rhythm (04/16 0400) Resp:  [12-22] 22 (04/16 0730) BP: (96-152)/(45-72) 126/56 (04/16 0700) SpO2:  [88 %-100 %] 100 % (04/16 0730) Arterial Line BP: (140-158)/(52-55) 140/55 (04/15 1200) FiO2 (%):  [40 %] 40 % (04/15 2326) Weight:  [117.9 kg] 117.9 kg (04/16 0500)  Hemodynamic parameters for last 24 hours: CVP:  [15 mmHg-69 mmHg] 18 mmHg  Intake/Output from previous day: 04/15 0701 - 04/16 0700 In: 1093 [P.O.:360; I.V.:733] Out: 3395 [Urine:3395] Intake/Output this shift: No intake/output data recorded.  General appearance: alert and cooperative Neurologic: intact Heart: regular rate and rhythm, S1, S2 normal, no murmur, click, rub or gallop Lungs: clear to auscultation bilaterally Abdomen: soft, non-tender; bowel sounds normal; no masses,  no organomegaly Extremities: edema 2+ Wound: c/d/i  Lab Results: Recent Labs    05/02/20 0409 05/03/20 0349  WBC 14.1* 11.2*  HGB 8.1* 7.7*  HCT 24.0* 23.0*  PLT 130* 150   BMET:  Recent Labs    05/02/20 0409 05/03/20 0349  NA 132* 131*  K 3.6 3.2*  CL 99 93*  CO2 23 25  GLUCOSE 143* 122*  BUN 47* 57*  CREATININE 3.75* 3.44*  CALCIUM 8.6* 8.6*    PT/INR:  Recent Labs    05/03/20 0349  LABPROT 24.2*  INR 2.2*   ABG    Component Value Date/Time   PHART 7.341 (L) 04/30/2020 2201   HCO3 22.9 04/30/2020 2201   TCO2 24 04/30/2020 2201   ACIDBASEDEF 3.0 (H) 04/30/2020 2201   O2SAT 50.7 05/03/2020 0349   CBG (last 3)  Recent Labs    05/02/20 2259 05/03/20 0326 05/03/20 0739  GLUCAP  116* 129* 140*    Assessment/Plan: S/P Procedure(s) (LRB): MITRAL VALVE (MV) REPLACEMENT USING ON-X VALVE SIZE 27/29MM (N/A) MAZE (N/A) TRANSESOPHAGEAL ECHOCARDIOGRAM (TEE) (N/A) CLIPPING OF ATRIAL APPENDAGE USING ATRICURE  PRO2 CLIP SIZE 45MM Mobilize Diuresis pulm toilet  Continue warfarin   LOS: 4 days    Leslie [name] 05/03/2020

## 2020-05-03 NOTE — Progress Notes (Addendum)
Advanced Heart Failure Rounding Note  PCP-Cardiologist: Ida Rogue, MD   Subjective:    On Milrinone 0.3 + Dopamine 3 + Lasix gtt at 30/hr. Diuresing well with 3.4L out overnight. Weight down 6 pounds.  Co-ox 58% -> 51%.  CVP 19-20  Scr starting to improve, 1.0>1.4>1.8>2.7>3.4>>3.8-> 3.4  Breathing better. No SOB, orthopnea or PND. Having indigestion at times.   Echo 4/15 EF 60-65% MVR ok    Objective:   Weight Range: 117.9 kg Body mass index is 44.62 kg/m.   Vital Signs:   Temp:  [97.3 F (36.3 C)-98.7 F (37.1 C)] 98.6 F (37 C) (04/16 0744) Pulse Rate:  [86-99] 92 (04/16 0730) Resp:  [12-22] 22 (04/16 0730) BP: (120-152)/(45-72) 126/56 (04/16 0700) SpO2:  [88 %-100 %] 100 % (04/16 0730) Arterial Line BP: (140)/(55) 140/55 (04/15 1200) FiO2 (%):  [40 %] 40 % (04/15 2326) Weight:  [117.9 kg] 117.9 kg (04/16 0500) Last BM Date: 04/28/20  Weight change: Filed Weights   04/30/20 0520 05/02/20 0500 05/03/20 0500  Weight: 115.7 kg 120.6 kg 117.9 kg    Intake/Output:   Intake/Output Summary (Last 24 hours) at 05/03/2020 0851 Last data filed at 05/03/2020 0800 Gross per 24 hour  Intake 1093.03 ml  Output 3765 ml  Net -2671.97 ml      Physical Exam    General:  Sitting up in chair. No resp difficulty HEENT: normal Neck: supple. RIJ introducer. Carotids 2+ bilat; no bruits. No lymphadenopathy or thryomegaly appreciated. Cor: PMI nondisplaced. Regular rate & rhythm. Mech s1 Lungs: decreased at bases Abdomen: obese soft, nontender, nondistended. No hepatosplenomegaly. No bruits or masses. Good bowel sounds. Extremities: no cyanosis, clubbing, rash, 1-2+ edema Neuro: alert & orientedx3, cranial nerves grossly intact. moves all 4 extremities w/o difficulty. Affect pleasant   Telemetry   NSR 80-90s   Labs    CBC Recent Labs    05/02/20 0409 05/03/20 0349  WBC 14.1* 11.2*  HGB 8.1* 7.7*  HCT 24.0* 23.0*  MCV 87.0 87.8  PLT 130* 357   Basic  Metabolic Panel Recent Labs    04/30/20 1654 04/30/20 2026 05/02/20 0409 05/03/20 0349  NA 134*   < > 132* 131*  K 3.9   < > 3.6 3.2*  CL 104   < > 99 93*  CO2 22   < > 23 25  GLUCOSE 193*   < > 143* 122*  BUN 26*   < > 47* 57*  CREATININE 2.66*   < > 3.75* 3.44*  CALCIUM 8.1*   < > 8.6* 8.6*  MG 2.9*  --   --   --    < > = values in this interval not displayed.   Liver Function Tests Recent Labs    05/02/20 0409 05/03/20 0349  AST 116* 73*  ALT 54* 49*  ALKPHOS 83 101  BILITOT 0.7 0.6  PROT 6.4* 6.4*  ALBUMIN 3.0* 2.8*   No results for input(s): LIPASE, AMYLASE in the last 72 hours. Cardiac Enzymes No results for input(s): CKTOTAL, CKMB, CKMBINDEX, TROPONINI in the last 72 hours.  BNP: BNP (last 3 results) No results for input(s): BNP in the last 8760 hours.  ProBNP (last 3 results) No results for input(s): PROBNP in the last 8760 hours.   D-Dimer No results for input(s): DDIMER in the last 72 hours. Hemoglobin A1C No results for input(s): HGBA1C in the last 72 hours. Fasting Lipid Panel No results for input(s): CHOL, HDL, LDLCALC, TRIG, CHOLHDL, LDLDIRECT  in the last 72 hours. Thyroid Function Tests No results for input(s): TSH, T4TOTAL, T3FREE, THYROIDAB in the last 72 hours.  Invalid input(s): FREET3  Other results:   Imaging    DG Chest Port 1 View  Result Date: 05/03/2020 CLINICAL DATA:  Status post mitral valve replacement. History of CHF, hypertension. EXAM: PORTABLE CHEST 1 VIEW COMPARISON:  Chest x-ray dated 05/02/2020. FINDINGS: Slightly improved aeration of both lungs. Persistent patchy bilateral airspace opacities, compatible with a combination of atelectasis and interstitial edema. No pleural effusion or pneumothorax is seen. Cardiac hardware appears stable in position. Heart size and mediastinal contours are stable in configuration. RIGHT IJ Cordis is stable in position. IMPRESSION: Slightly improved aeration of both lungs. Persistent patchy  bilateral airspace opacities, most likely a combination of atelectasis and interstitial edema. Electronically Signed   By: Franki Cabot M.D.   On: 05/03/2020 08:17   ECHOCARDIOGRAM LIMITED  Result Date: 05/02/2020    ECHOCARDIOGRAM LIMITED REPORT   Patient Name:   SONDRA BLIXT Date of Exam: 05/02/2020 Medical Rec #:  [id number]       Height:       64.0 in Accession #:    [id number]      Weight:       265.9 lb Date of Birth:  [birth year]        BSA:          2.208 m Patient Age:    55 years        BP:           130/60 mmHg Patient Gender: F               HR:           92 bpm. Exam Location:  Inpatient Procedure: Limited Echo, Color Doppler, Cardiac Doppler and Intracardiac            Opacification Agent Indications:    CHF-Acute Diastolic O35.00  History:        Patient has prior history of Echocardiogram examinations, most                 recent 04/29/2020. Arrythmias:Atrial Fibrillation; Risk                 Factors:Hypertension and Sleep Apnea. 04/29/2020 Clipping of                 Atrial Appendage using atricure PRO2 Clip Size 38mm. Post op                 pulmonary edema. Acute kidney injury.                  Mitral Valve: 27/29 mm Onyx-Valve mechanical valve valve is                 present in the mitral position. Procedure Date: 04/29/2020.  Sonographer:    Darlina Sicilian RDCS Referring Phys: [phone] Wilmer Floor SIMMONS  Sonographer Comments: Image acquisition challenging due to patient body habitus. IMPRESSIONS  1. Left ventricular ejection fraction, by estimation, is 60 to 65%. The left ventricle has normal function. The left ventricle has no regional wall motion abnormalities.  2. The mitral valve has been repaired/replaced. No evidence of mitral valve regurgitation. The mean mitral valve gradient is 4.0 mmHg. There is a 27/29 mm Onyx-Valve mechanical valve present in the mitral position. Procedure Date: 04/29/2020. FINDINGS  Left Ventricle: Left ventricular ejection fraction, by estimation, is 60 to 65%.  The left ventricle  has normal function. The left ventricle has no regional wall motion abnormalities. Definity contrast agent was given IV to delineate the left ventricular  endocardial borders. The left ventricular internal cavity size was normal in size. Pericardium: There is no evidence of pericardial effusion. Mitral Valve: The mitral valve has been repaired/replaced. There is a 27/29 mm Onyx-Valve mechanical valve present in the mitral position. Procedure Date: 04/29/2020. MV peak gradient, 10.0 mmHg. The mean mitral valve gradient is 4.0 mmHg. Tricuspid Valve: The tricuspid valve is normal in structure. Tricuspid valve regurgitation is trivial. No evidence of tricuspid stenosis. LEFT VENTRICLE PLAX 2D LVOT diam:     2.10 cm LVOT Area:     3.46 cm  MITRAL VALVE MV Peak grad: 10.0 mmHg SHUNTS MV Mean grad: 4.0 mmHg  Systemic Diam: 2.10 cm MV Vmax:      1.58 m/s MV Vmean:     91.8 cm/s Buford Dresser MD Electronically signed by Buford Dresser MD Signature Date/Time: 05/02/2020/6:19:48 PM    Final      Medications:     Scheduled Medications: . acetaminophen  1,000 mg Oral Q6H  . bisacodyl  10 mg Oral Daily   Or  . bisacodyl  10 mg Rectal Daily  . Chlorhexidine Gluconate Cloth  6 each Topical Daily  . docusate sodium  200 mg Oral Daily  . insulin aspart  0-24 Units Subcutaneous Q4H  . mouth rinse  15 mL Mouth Rinse BID  . pantoprazole  40 mg Oral Daily  . polyethylene glycol  17 g Oral Daily  . potassium chloride  20 mEq Oral BID  . sodium chloride flush  10-40 mL Intracatheter Q12H  . sodium chloride flush  3 mL Intravenous Q12H  . warfarin  1 mg Oral q1600  . Warfarin - Physician Dosing Inpatient   Does not apply q1600    Infusions: . sodium chloride    . sodium chloride    . DOPamine 3 mcg/kg/min (05/03/20 0600)  . furosemide (LASIX) 200 mg in dextrose 5% 100 mL (2mg /mL) infusion 30 mg/hr (05/03/20 0600)  . lactated ringers    . lactated ringers    . milrinone 0.3  mcg/kg/min (05/03/20 0600)    PRN Medications: Place/Maintain arterial line **AND** sodium chloride, fentaNYL (SUBLIMAZE) injection, ondansetron (ZOFRAN) IV, sodium chloride flush, sodium chloride flush, traMADol    Patient Profile   55 y/o woman with morbid obesity, HTN, PAF, OSA and rheumtic heart disease with severe MS. Underwent mechanical MVR and maze on 4/12. Post op course c/b pulmonary edema requiring BIPAP + nonoliguric AKI.   Assessment/Plan   1. Severe Mitral Valve Stenosis - 2/2 rheumatic heart disease  - s/p mechanical MVR 4/12 - on warfarin/ASA, INR 2.2. D/w Pharm D  2. Acute CHF/Pulmonary Edema>>Cardiogenic Shock  - perioperative TEE EF 60-65% with severe mitral stenosis. - developed acute pulmonary edema post op + AKI - initial Co-ox 43% - co-ox 51% today on milrinone 0.3 + Dopamine 3. Will recheck  - Echo 4/15 EF 60-65% MVR ok  - Still 14 pounds up from baseline weight. Continue lasix gtt for now. Limit fluid intake.  - Place TED hose  3. AKI  - 1.0>1.4>1.8>2.7>3.4>>3.8>3.4 - nephrology following  - suspect post-op ATN - Improving. Continue to follow  4. Acute Hypoxemic Respiratory Failure - due to pulmonary edema - currently off BiPAP - continue IV diuresis - PCCM following   5. PAF - s/p MAZE at time of MVR  - in NSR on tele  -  plan lifelong warfarin given mechanical MV   6. Hypokalemia - will supp  7. Constipation - sorbitol  Length of Stay: Clare, MD  05/03/2020, 8:51 AM  Advanced Heart Failure Team Pager [phone] (M-F; 7a - 5p)  Please contact [facility] for night-coverage after hours (5p -7a ) and weekends on [email or web address]

## 2020-05-03 NOTE — Progress Notes (Signed)
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  She was extubated postoperatively on 4/13.  She subsequently developed hypotension with low Coox and was started on dopamine and milrinone with improvement in her cooximetry.  She has had minimal urine output with rapidly increasing creatinine within normal baseline.  This morning she was started on a Lasix drip with minimal urine output so far.  She has respiratory failure requiring BiPAP to maintain adequate saturations overnight.  She currently only complains of mild sternal chest pain.  She denies any pre-existing history of lung disease, but has reported history of ILD in her chart.  She had PFTs in 2016 with normal FEV1/FVC ratio, reduced FEV1 (74% of predicted), normal total lung capacity by plethysmography with increased RV,  mildly reduced DLCO (flow volume loops and exact numbers not visible, only the interpretation). PCCM has been consulted for management of respiratory failure.   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 4/12 MAZE, mechanical MVR . 4/13 extubated by rapid wean protocol, hypotension requiring dopamine & milrinone . 4/14 requiring BiPAP, started on lasix drip. Still on inotropes.  Interim History / Subjective:  She remains off BiPAP, down to 3L O2. She still feels  SOB.  Objective   Blood pressure (!) 126/56, pulse 92, temperature 98.6 F (37 C), temperature source Oral, resp. rate (!) 22, height 5\' 4"  (1.626 m), weight 117.9 kg, SpO2 100 %. CVP:  [15 mmHg-69 mmHg] 26 mmHg  Vent Mode: BIPAP FiO2 (%):  [40 %] 40 % Set Rate:  [12 bmp] 12 bmp PEEP:  [5 cmH20] 5 cmH20   Intake/Output Summary (Last 24 hours) at 05/03/2020 0924 Last data filed at 05/03/2020 0800 Gross per 24 hour  Intake 1031.25 ml  Output 3665 ml  Net -2633.75 ml   Filed Weights   04/30/20 0520 05/02/20 0500 05/03/20 0500  Weight: 115.7 kg 120.6 kg 117.9 kg   UOP 3395cc  Examination: General: middle aged woman sitting up in the chair in NAD HENT: [place]/AT, eyes anicteric Lungs: bibasilar rhales, otherwise CTA. Breathing comfortably on [place]. Cardiovascular: S1S2, RRR, mechanical click. Sternal wound bandaged. Abdomen: obese, soft, NT Extremities: Improving pretibial edema, no cyanosis. Neuro: Awake and alert, normal speech, moving all extremities spontaneously. GU: foley with yellow urine  Labs/imaging that I have personally reviewed  (right click and "Reselect all SmartList Selections" daily)   CXR personally reviewed> pulmonary edema improving.  coox 53% WBC 11.2 H/H  7.7/ 23 Platelets 150 BUN 57 Creatinine 3.44 INR 2.2  [facility] Problem list     Assessment & Plan:   Acute respiratory failure with hypoxia Acute cardiogenic pulmonary edema --Wean supplemental O2 as able to maintain SpO2 >90% --Con't diuresis. --Continue milrinone and dopamine per cardiology for inotropic support. -No previous PFTs.  No history of obstructive lung disease, so likely no role for bronchodilators. --  Questionable history several years ago of ILD, but this was apparently based on CXR and could have been more related to atelectasis. She had no restriction on PFTs in 2016.  Cardiogenic shock requiring inotropic support, acute HFpEF MS s/p mechanical MVR, MAZE PH, WHO  group II -Serial coox to monitor cardiac function. -Continue dopamine and milrinone per cardiology. -Con't coumadin per pharmacy dosing -Diuresis; needs to maintain foley for strict I/Os  Oliguric AKI, suspect due to hypoperfusion -Appreciate nephrology's input --metolazone and lasix; con't monitoring I/Os -renally dose meds, avoid nephrotoxic meds  Acute blood loss anemia from operative blood loss Thrombocytopenia> resolved -transfuse for Hb<7 or hemodynamically significant bleeding; con't to monitor  Elevated transaminases likely due to heart failure> improving -no additional monitoring required unless change in clinical status -con't inotropic support  Leslie [name]'s husband was updated at bedside. PCCM will sign off. Please call with questions.  Best practice (right click and "Reselect all SmartList Selections" daily)  Diet:  Oral Pain/Anxiety/Delirium protocol (if indicated): No VAP protocol (if indicated): Not indicated DVT prophylaxis: Systemic AC GI prophylaxis: N/A Glucose control:  SSI Yes Central venous access:  N/A Arterial line:  Yes, and it is still needed Foley:  Yes, and it is still needed Mobility:  OOB  PT consulted: N/A Last date of multidisciplinary goals of care discussion [4/16] Code Status:  full code Disposition: ICU  Labs   CBC: Recent Labs  Lab 04/30/20 0420 04/30/20 0610 04/30/20 1654 04/30/20 2026 04/30/20 2201 05/01/20 0328 05/02/20 0409 05/03/20 0349  WBC 16.2*  --  19.1*  --   --  18.2* 14.1* 11.2*  HGB 8.7*   < > 8.4* 7.8* 7.8* 9.2* 8.1* 7.7*  HCT 26.5*   < > 25.8* 23.0* 23.0* 27.6* 24.0* 23.0*  MCV 89.5  --  91.5  --   --  87.3 87.0 87.8  PLT 164  --  151  --   --  145* 130* 150   < > = values in this interval not displayed.    Basic Metabolic Panel: Recent Labs  Lab 04/29/20 2016 04/29/20 2308 04/30/20 0420 04/30/20 0610 04/30/20 1654 04/30/20 2026 04/30/20 2201 05/01/20 0328 05/02/20 0409 05/03/20 0349  NA 139   < >  140   < > 134* 134* 136 135 132* 131*  K 4.3   < > 3.7   < > 3.9 3.8 3.8 3.7 3.6 3.2*  CL 111  --  110  --  104  --   --  100 99 93*  CO2 23  --  22  --  22  --   --  23 23 25   GLUCOSE 160*  --  146*  --  193*  --   --  167* 143* 122*  BUN 16  --  19  --  26*  --   --  33* 47* 57*  CREATININE 1.41*  --  1.75*  --  2.66*  --   --  3.38* 3.75* 3.44*  CALCIUM 7.2*  --  7.7*  --  8.1*  --   --  8.4* 8.6* 8.6*  MG 3.5*  --  3.2*  --  2.9*  --   --   --   --   --    < > = values in this interval not displayed.     Julian Hy, DO 05/03/20 9:24 AM [facility]

## 2020-05-03 NOTE — Progress Notes (Signed)
  TCTS evening Rounds  POD #4 s/p MVR Diuresed well today; improving renal function PE:  BP 130/62   Pulse 88   Temp 98.5 F (36.9 C)   Resp 13   Ht 5\' 4"  (1.626 m)   Wt 117.9 kg   SpO2 98%   BMI 44.62 kg/m  Alert/oriented CTA RRR 2+ edema  Intake/Output Summary (Last 24 hours) at 05/03/2020 1924 Last data filed at 05/03/2020 1800 Gross per 24 hour  Intake 1145.83 ml  Output 4220 ml  Net -3074.17 ml   A/p: continue present management. Patria Warzecha Z. Orvan Seen, [place]

## 2020-05-04 ENCOUNTER — Inpatient Hospital Stay (HOSPITAL_COMMUNITY): Payer: 59

## 2020-05-04 ENCOUNTER — Inpatient Hospital Stay: Payer: Self-pay

## 2020-05-04 DIAGNOSIS — J9601 Acute respiratory failure with hypoxia: Secondary | ICD-10-CM | POA: Diagnosis not present

## 2020-05-04 DIAGNOSIS — R570 Cardiogenic shock: Secondary | ICD-10-CM | POA: Diagnosis not present

## 2020-05-04 DIAGNOSIS — N179 Acute kidney failure, unspecified: Secondary | ICD-10-CM | POA: Diagnosis not present

## 2020-05-04 DIAGNOSIS — I342 Nonrheumatic mitral (valve) stenosis: Secondary | ICD-10-CM | POA: Diagnosis not present

## 2020-05-04 LAB — BASIC METABOLIC PANEL
Anion gap: 9 (ref 5–15)
BUN: 58 mg/dL — ABNORMAL HIGH (ref 6–20)
CO2: 29 mmol/L (ref 22–32)
Calcium: 8 mg/dL — ABNORMAL LOW (ref 8.9–10.3)
Chloride: 86 mmol/L — ABNORMAL LOW (ref 98–111)
Creatinine, Ser: 2.1 mg/dL — ABNORMAL HIGH (ref 0.44–1.00)
GFR, Estimated: 28 mL/min — ABNORMAL LOW (ref >60)
Glucose, Bld: 270 mg/dL — ABNORMAL HIGH (ref 70–99)
Potassium: 2.7 mmol/L — CL (ref 3.5–5.1)
Sodium: 124 mmol/L — ABNORMAL LOW (ref 135–145)

## 2020-05-04 LAB — CBC
HCT: 24.6 % — ABNORMAL LOW (ref 36.0–46.0)
Hemoglobin: 8.2 g/dL — ABNORMAL LOW (ref 12.0–15.0)
MCH: 29 pg (ref 26.0–34.0)
MCHC: 33.3 g/dL (ref 30.0–36.0)
MCV: 86.9 fL (ref 80.0–100.0)
Platelets: 172 10*3/uL (ref 150–400)
RBC: 2.83 MIL/uL — ABNORMAL LOW (ref 3.87–5.11)
RDW: 14.5 % (ref 11.5–15.5)
WBC: 10 10*3/uL (ref 4.0–10.5)
nRBC: 0.5 % — ABNORMAL HIGH (ref 0.0–0.2)

## 2020-05-04 LAB — RENAL FUNCTION PANEL
Albumin: 2.8 g/dL — ABNORMAL LOW (ref 3.5–5.0)
Anion gap: 14 (ref 5–15)
BUN: 64 mg/dL — ABNORMAL HIGH (ref 6–20)
CO2: 30 mmol/L (ref 22–32)
Calcium: 8.7 mg/dL — ABNORMAL LOW (ref 8.9–10.3)
Chloride: 87 mmol/L — ABNORMAL LOW (ref 98–111)
Creatinine, Ser: 2.68 mg/dL — ABNORMAL HIGH (ref 0.44–1.00)
GFR, Estimated: 21 mL/min — ABNORMAL LOW (ref >60)
Glucose, Bld: 119 mg/dL — ABNORMAL HIGH (ref 70–99)
Phosphorus: 4.8 mg/dL — ABNORMAL HIGH (ref 2.5–4.6)
Potassium: 2.7 mmol/L — CL (ref 3.5–5.1)
Sodium: 131 mmol/L — ABNORMAL LOW (ref 135–145)

## 2020-05-04 LAB — GLUCOSE, CAPILLARY
Glucose-Capillary: 116 mg/dL — ABNORMAL HIGH (ref 70–99)
Glucose-Capillary: 125 mg/dL — ABNORMAL HIGH (ref 70–99)
Glucose-Capillary: 123 mg/dL — ABNORMAL HIGH (ref 70–99)
Glucose-Capillary: 120 mg/dL — ABNORMAL HIGH (ref 70–99)
Glucose-Capillary: 125 mg/dL — ABNORMAL HIGH (ref 70–99)

## 2020-05-04 LAB — COOXEMETRY PANEL
Carboxyhemoglobin: 1.4 % (ref 0.5–1.5)
Carboxyhemoglobin: 1.1 % (ref 0.5–1.5)
Methemoglobin: 1.3 % (ref 0.0–1.5)
Methemoglobin: 1.3 % (ref 0.0–1.5)
O2 Saturation: 84.8 %
O2 Saturation: 54.3 %
Total hemoglobin: 8.1 g/dL — ABNORMAL LOW (ref 12.0–16.0)
Total hemoglobin: 9 g/dL — ABNORMAL LOW (ref 12.0–16.0)

## 2020-05-04 LAB — COMPREHENSIVE METABOLIC PANEL
ALT: 44 U/L (ref 0–44)
AST: 50 U/L — ABNORMAL HIGH (ref 15–41)
Albumin: 2.9 g/dL — ABNORMAL LOW (ref 3.5–5.0)
Alkaline Phosphatase: 93 U/L (ref 38–126)
Anion gap: 14 (ref 5–15)
BUN: 62 mg/dL — ABNORMAL HIGH (ref 6–20)
CO2: 30 mmol/L (ref 22–32)
Calcium: 8.8 mg/dL — ABNORMAL LOW (ref 8.9–10.3)
Chloride: 88 mmol/L — ABNORMAL LOW (ref 98–111)
Creatinine, Ser: 2.77 mg/dL — ABNORMAL HIGH (ref 0.44–1.00)
GFR, Estimated: 20 mL/min — ABNORMAL LOW (ref >60)
Glucose, Bld: 120 mg/dL — ABNORMAL HIGH (ref 70–99)
Potassium: 2.7 mmol/L — CL (ref 3.5–5.1)
Sodium: 132 mmol/L — ABNORMAL LOW (ref 135–145)
Total Bilirubin: 0.7 mg/dL (ref 0.3–1.2)
Total Protein: 6.7 g/dL (ref 6.5–8.1)

## 2020-05-04 LAB — PROTIME-INR
INR: 1.6 — ABNORMAL HIGH (ref 0.8–1.2)
Prothrombin Time: 19.3 seconds — ABNORMAL HIGH (ref 11.4–15.2)

## 2020-05-04 LAB — MAGNESIUM: Magnesium: 2.6 mg/dL — ABNORMAL HIGH (ref 1.7–2.4)

## 2020-05-04 MED ORDER — SODIUM CHLORIDE 0.9% FLUSH
10.0000 mL | INTRAVENOUS | Status: DC | PRN
  Administered 2020-05-09 – 2020-05-10 (×2): 20 mL

## 2020-05-04 MED ORDER — POTASSIUM CHLORIDE 10 MEQ/50ML IV SOLN
10.0000 meq | INTRAVENOUS | Status: AC
  Administered 2020-05-04 (×4): 10 meq via INTRAVENOUS
  Filled 2020-05-04 (×4): qty 50

## 2020-05-04 MED ORDER — FUROSEMIDE 10 MG/ML IJ SOLN
80.0000 mg | Freq: Two times a day (BID) | INTRAMUSCULAR | Status: DC
  Administered 2020-05-04: 80 mg via INTRAVENOUS
  Filled 2020-05-04 (×2): qty 8

## 2020-05-04 MED ORDER — POTASSIUM CHLORIDE CRYS ER 20 MEQ PO TBCR
40.0000 meq | EXTENDED_RELEASE_TABLET | Freq: Once | ORAL | Status: AC
  Administered 2020-05-04: 40 meq via ORAL
  Filled 2020-05-04: qty 2

## 2020-05-04 MED ORDER — INSULIN ASPART 100 UNIT/ML ~~LOC~~ SOLN
0.0000 [IU] | Freq: Three times a day (TID) | SUBCUTANEOUS | Status: DC
  Administered 2020-05-05 – 2020-05-09 (×7): 2 [IU] via SUBCUTANEOUS
  Administered 2020-05-10: 0 [IU] via SUBCUTANEOUS
  Administered 2020-05-10 – 2020-05-11 (×3): 2 [IU] via SUBCUTANEOUS

## 2020-05-04 MED ORDER — SPIRONOLACTONE 25 MG PO TABS
25.0000 mg | ORAL_TABLET | Freq: Every day | ORAL | Status: DC
  Administered 2020-05-04: 25 mg via ORAL
  Filled 2020-05-04: qty 1

## 2020-05-04 MED ORDER — SPIRONOLACTONE 25 MG PO TABS
25.0000 mg | ORAL_TABLET | Freq: Two times a day (BID) | ORAL | Status: DC
  Administered 2020-05-04: 25 mg via ORAL
  Filled 2020-05-04: qty 1

## 2020-05-04 MED ORDER — WARFARIN SODIUM 1 MG PO TABS
1.5000 mg | ORAL_TABLET | Freq: Every day | ORAL | Status: DC
  Administered 2020-05-04: 1.5 mg via ORAL
  Filled 2020-05-04: qty 1

## 2020-05-04 MED ORDER — SODIUM CHLORIDE 0.9% FLUSH
10.0000 mL | Freq: Two times a day (BID) | INTRAVENOUS | Status: DC
  Administered 2020-05-04: 10 mL

## 2020-05-04 MED ORDER — POTASSIUM CHLORIDE CRYS ER 20 MEQ PO TBCR: 20.0000 meq | EXTENDED_RELEASE_TABLET | Freq: Two times a day (BID) | ORAL | Status: DC

## 2020-05-04 MED ORDER — POTASSIUM CHLORIDE 10 MEQ/50ML IV SOLN
10.0000 meq | INTRAVENOUS | Status: AC
  Administered 2020-05-04 (×6): 10 meq via INTRAVENOUS
  Filled 2020-05-04 (×6): qty 50

## 2020-05-04 MED ORDER — POTASSIUM CHLORIDE CRYS ER 20 MEQ PO TBCR
40.0000 meq | EXTENDED_RELEASE_TABLET | ORAL | Status: AC
  Administered 2020-05-04 – 2020-05-05 (×3): 40 meq via ORAL
  Filled 2020-05-04 (×3): qty 2

## 2020-05-04 NOTE — Progress Notes (Signed)
5 Days Post-Op Procedure(s) (LRB): MITRAL VALVE (MV) REPLACEMENT USING ON-X VALVE SIZE 27/29MM (N/A) MAZE (N/A) TRANSESOPHAGEAL ECHOCARDIOGRAM (TEE) (N/A) CLIPPING OF ATRIAL APPENDAGE USING ATRICURE  PRO2 CLIP SIZE 45MM Subjective: Feeling better Objective: Vital signs in last 24 hours: Temp:  [97.6 F (36.4 C)-98.5 F (36.9 C)] 98.4 F (36.9 C) (04/17 0739) Pulse Rate:  [88-101] 99 (04/17 0600) Cardiac Rhythm: Normal sinus rhythm;Sinus tachycardia (04/17 0400) Resp:  [11-23] 13 (04/17 0600) BP: (102-151)/(47-94) 136/65 (04/17 0600) SpO2:  [92 %-100 %] 100 % (04/17 0600)  Hemodynamic parameters for last 24 hours: CVP:  [15 mmHg-35 mmHg] 17 mmHg  Intake/Output from previous day: 04/16 0701 - 04/17 0700 In: 931.2 [P.O.:120; I.V.:811.2] Out: 5585 [Urine:5585] Intake/Output this shift: Total I/O In: -  Out: 230 [Urine:230]  General appearance: alert and cooperative Neurologic: intact Heart: regular rate and rhythm, S1, S2 normal, no murmur, click, rub or gallop Lungs: clear to auscultation bilaterally Abdomen: soft, non-tender; bowel sounds normal; no masses,  no organomegaly Extremities: edema mild Wound: c/d/i  Lab Results: Recent Labs    05/03/20 0349 05/04/20 0500  WBC 11.2* 10.0  HGB 7.7* 8.2*  HCT 23.0* 24.6*  PLT 150 172   BMET:  Recent Labs    05/03/20 1600 05/04/20 0500  NA 130* 132*  131*  K 2.8* 2.7*  2.7*  CL 89* 88*  87*  CO2 28 30  30   GLUCOSE 129* 120*  119*  BUN 62* 62*  64*  CREATININE 3.02* 2.77*  2.68*  CALCIUM 8.5* 8.8*  8.7*    PT/INR:  Recent Labs    05/04/20 0500  LABPROT 19.3*  INR 1.6*   ABG    Component Value Date/Time   PHART 7.341 (L) 04/30/2020 2201   HCO3 22.9 04/30/2020 2201   TCO2 24 04/30/2020 2201   ACIDBASEDEF 3.0 (H) 04/30/2020 2201   O2SAT 84.8 05/04/2020 0530   CBG (last 3)  Recent Labs    05/03/20 2347 05/04/20 0402 05/04/20 0654  GLUCAP 106* 116* 125*    Assessment/Plan: S/P  Procedure(s) (LRB): MITRAL VALVE (MV) REPLACEMENT USING ON-X VALVE SIZE 27/29MM (N/A) MAZE (N/A) TRANSESOPHAGEAL ECHOCARDIOGRAM (TEE) (N/A) CLIPPING OF ATRIAL APPENDAGE USING ATRICURE  PRO2 CLIP SIZE 45MM Mobilize Diuresis PICC consult  Warfarin per pharmacy   LOS: 5 days    Wonda Olds 05/04/2020

## 2020-05-04 NOTE — Progress Notes (Signed)
Received critical value for K 2.5, patient asymptomatic and pharmacy notified. Will administer k per order.

## 2020-05-04 NOTE — Progress Notes (Signed)
Nephrology Follow-Up Consult note   Assessment/Recommendations: Leslie [name] is a/an 55 y.o. female with a past medical history significant for mitral stenosis, CKD, nephrolithiasis, admitted for mitral stenosis s/p MVR w/ MAZE and LA appendage clipping on 4/12.  Course has been complicated by AKI.     Non-Oliguric AKI, severe, improving: Likely secondary to ATN from hypoperfusion during surgery.  No signs of obvious obstruction.  Creatinine now improving also with excellent urine output -Pause Lasix this morning so potassium can be repleted -Continue to monitor daily Cr, Dose meds for GFR -Monitor Daily I/Os, Daily weight  -Maintain MAP>65 for optimal renal perfusion.  -Avoid nephrotoxic medications including NSAIDs and Vanc/Zosyn combo  Volume overload/heart failure exacerbation: Volume overload related to surgery, decreased renal function, heart failure.  Volume status improving with excellent urine output -Pause Lasix today so potassium can be repleted -Then start Lasix 80 mg twice daily; titrate as needed -Start spironolactone 25 mg daily to help with potassium loss -Potassium repletion as below -Continue milrinone and dopamine per primary  Mitral valve stenosis: s/p repair. Surgery managing  Anemia: Likely multifactorial with acute illness contributing. Management per primary and transfuse prn  Hypokalemia: Related to diuretic use was increased urine output.  IV and oral repletion this morning.  Start spironolactone as above.  Standing oral repletion.  Monitor lytes twice daily while diuresing  Recommendations conveyed to primary service.    [facility] 05/04/2020 8:18 AM  ___________________________________________________________  CC: AKI on CKD  Interval History/Subjective: Patient states she feels okay today.  Continues to have fatigue.  Slowly but surely improving.  Excellent urine output with 5 L made.  Potassium has been low despite  oral repletion.  Creatinine improving   Medications:  Current Facility-Administered Medications  Medication Dose Route Frequency Provider Last Rate Last Admin  . 0.9 %  sodium chloride infusion  250 mL Intravenous Continuous Barrett, Erin R, PA-C      . 0.9 %  sodium chloride infusion   Intra-arterial PRN Rexene Alberts, MD      . acetaminophen (TYLENOL) tablet 1,000 mg  1,000 mg Oral Q6H Barrett, Erin R, PA-C   1,000 mg at 05/04/20 0535  . bisacodyl (DULCOLAX) EC tablet 10 mg  10 mg Oral Daily Barrett, Erin R, PA-C   10 mg at 05/03/20 1610   Or  . bisacodyl (DULCOLAX) suppository 10 mg  10 mg Rectal Daily Barrett, Erin R, PA-C      . Chlorhexidine Gluconate Cloth 2 % PADS 6 each  6 each Topical Daily Rexene Alberts, MD   6 each at 05/03/20 0830  . docusate sodium (COLACE) capsule 200 mg  200 mg Oral Daily Barrett, Erin R, PA-C   200 mg at 05/03/20 0834  . DOPamine (INTROPIN) 800 mg in dextrose 5 % 250 mL (3.2 mg/mL) infusion  3 mcg/kg/min Intravenous Titrated Rexene Alberts, MD 6.51 mL/hr at 05/04/20 0600 3 mcg/kg/min at 05/04/20 0600  . fentaNYL (SUBLIMAZE) injection 50-100 mcg  50-100 mcg Intravenous Q1H PRN Barrett, Erin R, PA-C   50 mcg at 05/02/20 0426  . furosemide (LASIX) injection 80 mg  80 mg Intravenous BID Reesa Chew, MD      . insulin aspart (novoLOG) injection 0-24 Units  0-24 Units Subcutaneous Q4H Rexene Alberts, MD   2 Units at 05/04/20 0725  . lactated ringers infusion   Intravenous Continuous Barrett, Erin R, PA-C      . lactated ringers infusion  Intravenous Continuous Barrett, Erin R, PA-C      . MEDLINE mouth rinse  15 mL Mouth Rinse BID Rexene Alberts, MD   15 mL at 05/03/20 2111  . milrinone (PRIMACOR) 20 MG/100 ML (0.2 mg/mL) infusion  0.4 mcg/kg/min Intravenous Continuous Rexene Alberts, MD 13.36 mL/hr at 05/04/20 0727 0.4 mcg/kg/min at 05/04/20 0727  . ondansetron (ZOFRAN) injection 4 mg  4 mg Intravenous Q6H PRN Barrett, Erin R, PA-C   4 mg at  05/03/20 1021  . pantoprazole (PROTONIX) EC tablet 40 mg  40 mg Oral Daily Barrett, Erin R, PA-C   40 mg at 05/03/20 0834  . polyethylene glycol (MIRALAX / GLYCOLAX) packet 17 g  17 g Oral Daily Noemi Chapel P, DO   17 g at 05/03/20 0834  . potassium chloride 10 mEq in 50 mL *CENTRAL LINE* IVPB  10 mEq Intravenous Q1 Hr x 4 Reesa Chew, MD 50 mL/hr at 05/04/20 0726 10 mEq at 05/04/20 0726  . potassium chloride SA (KLOR-CON) CR tablet 20 mEq  20 mEq Oral BID Reesa Chew, MD      . sodium chloride flush (NS) 0.9 % injection 10-40 mL  10-40 mL Intracatheter Q12H Rexene Alberts, MD   10 mL at 05/03/20 2111  . sodium chloride flush (NS) 0.9 % injection 10-40 mL  10-40 mL Intracatheter PRN Rexene Alberts, MD      . sodium chloride flush (NS) 0.9 % injection 3 mL  3 mL Intravenous Q12H Barrett, Erin R, PA-C   3 mL at 05/03/20 2111  . sodium chloride flush (NS) 0.9 % injection 3 mL  3 mL Intravenous PRN Barrett, Erin R, PA-C      . spironolactone (ALDACTONE) tablet 25 mg  25 mg Oral Daily Reesa Chew, MD      . traMADol Veatrice Bourbon) tablet 50-100 mg  50-100 mg Oral Q4H PRN Barrett, Erin R, PA-C   50 mg at 05/03/20 1231  . warfarin (COUMADIN) tablet 1 mg  1 mg Oral q1600 Rexene Alberts, MD   1 mg at 05/03/20 1711  . Warfarin - Physician Dosing Inpatient   Does not apply q1600 Rexene Alberts, MD   Given at 05/02/20 1615      Review of Systems: 10 systems reviewed and negative except per interval history/subjective  Physical Exam: Vitals:   05/04/20 0600 05/04/20 0739  BP: 136/65   Pulse: 99   Resp: 13   Temp:  98.4 F (36.9 C)  SpO2: 100%    No intake/output data recorded.  Intake/Output Summary (Last 24 hours) at 05/04/2020 0818 Last data filed at 05/04/2020 0600 Gross per 24 hour  Intake 868.29 ml  Output 5155 ml  Net -4286.71 ml   Constitutional: sitting in chair, no distress ENMT: ears and nose without scars or lesions, MMM CV: normal rate, trace bilateral  LEE Respiratory: Bilateral chest rise with no increased work of breathing Gastrointestinal: soft, non-tender, no palpable masses or hernias Skin: no visible lesions or rashes Psych: alert, judgement/insight appropriate, appropriate mood and affect   Test Results I personally reviewed new and old clinical labs and radiology tests Lab Results  Component Value Date   NA 132 (L) 05/04/2020   NA 131 (L) 05/04/2020   K 2.7 (LL) 05/04/2020   K 2.7 (LL) 05/04/2020   CL 88 (L) 05/04/2020   CL 87 (L) 05/04/2020   CO2 30 05/04/2020   CO2 30 05/04/2020   BUN 62 (H)  05/04/2020   BUN 64 (H) 05/04/2020   CREATININE 2.77 (H) 05/04/2020   CREATININE 2.68 (H) 05/04/2020   CALCIUM 8.8 (L) 05/04/2020   CALCIUM 8.7 (L) 05/04/2020   ALBUMIN 2.9 (L) 05/04/2020   ALBUMIN 2.8 (L) 05/04/2020   PHOS 4.8 (H) 05/04/2020

## 2020-05-04 NOTE — Progress Notes (Signed)
  TCTS Evening Rounds  Doing well sCr down to 2.1 PICC placed; R IJ cordis out  BP (!) 153/71 (BP Location: Right Wrist)   Pulse (!) 104   Temp 98.5 F (36.9 C) (Oral)   Resp (!) 24   Ht 5\' 4"  (1.626 m)   Wt 115.5 kg   SpO2 97%   BMI 43.71 kg/m  Alert/oriented Mildly tachy CTA   Intake/Output Summary (Last 24 hours) at 05/04/2020 1729 Last data filed at 05/04/2020 1500 Gross per 24 hour  Intake 1414.07 ml  Output 4670 ml  Net -3255.93 ml    A/p: Much improved Replace K Out of ICU tomorrow probably Leslie [name] Z. Orvan Seen, [place]

## 2020-05-04 NOTE — Progress Notes (Signed)
ANTICOAGULATION CONSULT NOTE - Initial Consult  Pharmacy Consult for warfarin Indication: MVR - mechanical, Hx Afib  Allergies  Allergen Reactions  . Morphine Hives and Rash  . Oxycodone Hcl Hives and Itching  . Dilaudid [Hydromorphone Hcl] Itching    Patient Measurements: Height: '5\' 4"'  (162.6 cm) Weight: 117.9 kg (259 lb 14.8 oz) IBW/kg (Calculated) : 54.7   Vital Signs: Temp: 98.4 F (36.9 C) (04/17 0739) Temp Source: Oral (04/17 0739) BP: 136/65 (04/17 0600) Pulse Rate: 99 (04/17 0600)  Labs: Recent Labs    05/02/20 0409 05/03/20 0349 05/03/20 1600 05/04/20 0500  HGB 8.1* 7.7*  --  8.2*  HCT 24.0* 23.0*  --  24.6*  PLT 130* 150  --  172  LABPROT 25.7* 24.2*  --  19.3*  INR 2.3* 2.2*  --  1.6*  CREATININE 3.75* 3.44* 3.02* 2.77*  2.68*    Estimated Creatinine Clearance: 29.7 mL/min (A) (by C-G formula based on SCr of 2.77 mg/dL (H)).   Medical History: Past Medical History:  Diagnosis Date  . (HFpEF) heart failure with preserved ejection fraction ([place])    a. 08/2017 Echo: EF 55-60%.  Grade 2 diastolic dysfunction.  Moderate mitral stenosis.  . Allergy   . Anemia   . Anxiety   . BRCA negative 03/22/2013  . Bronchitis 02/19/2016   ON LEVAQUIN PO  . Chest tightness   . CHF (congestive heart failure) ([place])   . Cigarette smoker 09/11/2017   8-10 day  . Dyspnea   . Dysrhythmia   . GERD (gastroesophageal reflux disease)   . Heart murmur    Per pt. dx by Dr. Roxy Manns via [place]  . History of kidney stones   . Hyperlipidemia   . Hypertension   . Interstitial lung disease ([place])    a. CT 2013 b. 02/2018 CXR noted recurrent intersistial changes ILD vs chronic bronchitis  . Moderate mitral stenosis    a.  08/2017 TEE: EF 60 to 65%.  Moderate mitral stenosis.  Mean gradient 14 mmHg.  Valve area 2.59 cm by planimetry, 2.72 cm by pressure half-time.  . Persistent atrial fibrillation ([place])    a. 08/2017 s/p TEE/DCCV; b. CHA2DS2VASc = 2-->warfarin.  . Pneumonia    . S/P Maze operation for atrial fibrillation 04/29/2020   Complete bilateral atrial lesion set using bipolar radiofrequency and cryothermy ablation with clipping of LA appendage  . S/P mitral valve replacement with Onyx bileaflet mechanical valve 04/29/2020   27/29 mm Onyx-Valve      Assessment: Leslie [name] with Hx Afib on warfarin 1.4m daily PTA now s/p mechanical MVR Warfarin restarted post op 147mdaily INR fell 2.3 > 1.6  Post op cbc stable   Goal of Therapy:  INR2.5-3.5 Monitor platelets by anticoagulation protocol: Yes   Plan:  Warfarin 1.25m44m1 today  Daily Protime  Watch CBC    LisBonnita Nasutiarm.D. CPP, BCPS Clinical Pharmacist [phone]/2022 9:50 AM

## 2020-05-04 NOTE — Progress Notes (Signed)
PICC placement approved by Dr Joylene Grapes, nephrology.

## 2020-05-04 NOTE — Progress Notes (Signed)
Peripherally Inserted Central Catheter Placement  The IV Nurse has discussed with the patient and/or persons authorized to consent for the patient, the purpose of this procedure and the potential benefits and risks involved with this procedure.  The benefits include less needle sticks, lab draws from the catheter, and the patient may be discharged home with the catheter. Risks include, but not limited to, infection, bleeding, blood clot (thrombus formation), and puncture of an artery; nerve damage and irregular heartbeat and possibility to perform a PICC exchange if needed/ordered by physician.  Alternatives to this procedure were also discussed.  Bard Power PICC patient education guide, fact sheet on infection prevention and patient information card has been provided to patient /or left at bedside.    PICC Placement Documentation  PICC Double Lumen 05/04/20 PICC Right Cephalic 39 cm 0 cm (Active)  Indication for Insertion or Continuance of Line Vasoactive infusions 05/04/20 1325  Exposed Catheter (cm) 0 cm 05/04/20 1325  Site Assessment Clean;Dry;Intact 05/04/20 1325  Lumen #1 Status Flushed;Saline locked;Blood return noted 05/04/20 1325  Lumen #2 Status Flushed;Saline locked;Blood return noted 05/04/20 1325  Dressing Type Transparent 05/04/20 1325  Dressing Status Clean;Dry;Intact 05/04/20 1325  Antimicrobial disc in place? Yes 05/04/20 1325  Safety Lock Not Applicable 31/49/70 2637  Line Care Connections checked and tightened 05/04/20 1325  Line Adjustment (NICU/IV Team Only) No 05/04/20 1325  Dressing Intervention New dressing 05/04/20 1325  Dressing Change Due 05/11/20 05/04/20 1325       Rolena Infante 05/04/2020, 1:30 PM

## 2020-05-04 NOTE — Progress Notes (Signed)
Advanced Heart Failure Rounding Note  PCP-Cardiologist: Ida Rogue, MD   Subjective:    Milrinone increased to 0.4 yesterday. Continues on Dopamine 3 + Lasix gtt at 30/hr.   Put out 5.5 liters last night. Lasix stopped this am by Renal with K 2.7. Cr 3.4 -> 2.77  Co-ox 58% -> 51% -> 85% -> 54% CVP 18  Scr starting to improve, 1.0>1.4>1.8>2.7>3.4>>3.8-> 3.4 -> 2.77  Breathing better. No orthopnea or PND  Echo 4/15 EF 60-65% MVR ok    Objective:   Weight Range: 117.9 kg Body mass index is 44.62 kg/m.   Vital Signs:   Temp:  [97.6 F (36.4 C)-98.5 F (36.9 C)] 98.4 F (36.9 C) (04/17 0739) Pulse Rate:  [88-101] 99 (04/17 0600) Resp:  [11-23] 13 (04/17 0600) BP: (102-151)/(47-94) 136/65 (04/17 0600) SpO2:  [92 %-100 %] 100 % (04/17 0600) Last BM Date: 05/03/20  Weight change: Filed Weights   04/30/20 0520 05/02/20 0500 05/03/20 0500  Weight: 115.7 kg 120.6 kg 117.9 kg    Intake/Output:   Intake/Output Summary (Last 24 hours) at 05/04/2020 0859 Last data filed at 05/04/2020 0833 Gross per 24 hour  Intake 868.29 ml  Output 5385 ml  Net -4516.71 ml      Physical Exam    General:  Sitting in chair No resp difficulty HEENT: normal Neck: supple. RIJ introducer  JVP up to jaw Carotids 2+ bilat; no bruits. No lymphadenopathy or thryomegaly appreciated. Cor: PMI nondisplaced. Regular rate & rhythm. Mechanical s1 Lungs: clear Abdomen: obese soft, nontender, nondistended. No hepatosplenomegaly. No bruits or masses. Good bowel sounds. Extremities: no cyanosis, clubbing, rash, 2+ edema Neuro: alert & orientedx3, cranial nerves grossly intact. moves all 4 extremities w/o difficulty. Affect pleasant   Telemetry   NSR 90-100 Personally reviewed    Labs    CBC Recent Labs    05/03/20 0349 05/04/20 0500  WBC 11.2* 10.0  HGB 7.7* 8.2*  HCT 23.0* 24.6*  MCV 87.8 86.9  PLT 150 144   Basic Metabolic Panel Recent Labs    05/03/20 1600 05/04/20 0500   NA 130* 132*  131*  K 2.8* 2.7*  2.7*  CL 89* 88*  87*  CO2 28 30  30   GLUCOSE 129* 120*  119*  BUN 62* 62*  64*  CREATININE 3.02* 2.77*  2.68*  CALCIUM 8.5* 8.8*  8.7*  MG  --  2.6*  PHOS  --  4.8*   Liver Function Tests Recent Labs    05/03/20 0349 05/04/20 0500  AST 73* 50*  ALT 49* 44  ALKPHOS 101 93  BILITOT 0.6 0.7  PROT 6.4* 6.7  ALBUMIN 2.8* 2.9*  2.8*   No results for input(s): LIPASE, AMYLASE in the last 72 hours. Cardiac Enzymes No results for input(s): CKTOTAL, CKMB, CKMBINDEX, TROPONINI in the last 72 hours.  BNP: BNP (last 3 results) No results for input(s): BNP in the last 8760 hours.  ProBNP (last 3 results) No results for input(s): PROBNP in the last 8760 hours.   D-Dimer No results for input(s): DDIMER in the last 72 hours. Hemoglobin A1C No results for input(s): HGBA1C in the last 72 hours. Fasting Lipid Panel No results for input(s): CHOL, HDL, LDLCALC, TRIG, CHOLHDL, LDLDIRECT in the last 72 hours. Thyroid Function Tests No results for input(s): TSH, T4TOTAL, T3FREE, THYROIDAB in the last 72 hours.  Invalid input(s): FREET3  Other results:   Imaging    DG Chest Port 1 View  Result Date: 05/04/2020  CLINICAL DATA:  Status post mitral valve replacement. EXAM: PORTABLE CHEST 1 VIEW COMPARISON:  Is chest x-rays dated 05/03/2020 and 05/02/2020. FINDINGS: Heart size and mediastinal contours are stable. Cardiac valve hardware appears stable in position. RIGHT IJ Cordis remains in place. Bibasilar opacities are slightly improved. No new lung findings. No pneumothorax is seen. IMPRESSION: Slightly improved aeration at both lung bases suggesting resolving atelectasis and/or edema. No new lung findings. Electronically Signed   By: Franki Cabot M.D.   On: 05/04/2020 08:15   [place] EKG SITE RITE  Result Date: 05/04/2020 If Site Rite image not attached, placement could not be confirmed due to current cardiac rhythm.    Medications:      Scheduled Medications: . acetaminophen  1,000 mg Oral Q6H  . bisacodyl  10 mg Oral Daily   Or  . bisacodyl  10 mg Rectal Daily  . Chlorhexidine Gluconate Cloth  6 each Topical Daily  . docusate sodium  200 mg Oral Daily  . furosemide  80 mg Intravenous BID  . insulin aspart  0-24 Units Subcutaneous Q4H  . mouth rinse  15 mL Mouth Rinse BID  . pantoprazole  40 mg Oral Daily  . polyethylene glycol  17 g Oral Daily  . potassium chloride  20 mEq Oral BID  . sodium chloride flush  10-40 mL Intracatheter Q12H  . sodium chloride flush  3 mL Intravenous Q12H  . spironolactone  25 mg Oral Daily  . warfarin  1 mg Oral q1600  . Warfarin - Physician Dosing Inpatient   Does not apply q1600    Infusions: . sodium chloride    . sodium chloride    . DOPamine 3 mcg/kg/min (05/04/20 0600)  . lactated ringers    . lactated ringers    . milrinone 0.4 mcg/kg/min (05/04/20 0727)  . potassium chloride 10 mEq (05/04/20 0833)    PRN Medications: Place/Maintain arterial line **AND** sodium chloride, fentaNYL (SUBLIMAZE) injection, ondansetron (ZOFRAN) IV, sodium chloride flush, sodium chloride flush, traMADol    Patient Profile   55 y/o woman with morbid obesity, HTN, PAF, OSA and rheumtic heart disease with severe MS. Underwent mechanical MVR and maze on 4/12. Post op course c/b pulmonary edema requiring BIPAP + nonoliguric AKI.   Assessment/Plan   1. Severe Mitral Valve Stenosis - 2/2 rheumatic heart disease  - s/p mechanical MVR 4/12 - on warfarin/ASA, INR 2.2. D/w Pharm D  2. Acute CHF/Pulmonary Edema>>Cardiogenic Shock  - perioperative TEE EF 60-65% with severe mitral stenosis. - developed acute pulmonary edema post op + AKI - initial Co-ox 43% - co-ox 51% -> 85% today on milrinone 0.4 + Dopamine 3. Will recheck -> 54% - Echo 4/15 EF 60-65% MVR ok  - Good diuresis overnght but CVP still 18 with marked fluid overload on exam . Renal has written to hold lasix gtt while K being  supped then switch to lasix 80 IV bid. Will follow. Needs aggressive diuresis still - Place TED hose  3. AKI  - 1.0>1.4>1.8>2.7>3.4>>3.8>3.4 - nephrology following  - suspect post-op ATN - Improving. Continue to follow  4. Acute Hypoxemic Respiratory Failure - due to pulmonary edema - much improved with  IV diuresis  5. PAF - s/p MAZE at time of MVR  - in NSR on tele  - INR 1.6 plan lifelong warfarin given mechanical MV   6. Hypokalemia - will supp. Discussed dosing with PharmD personally. - Continue spiro. Will increase to 25 bid  CRITICAL CARE Performed by: Kaija Kovacevic,  Durinda Buzzelli  Total critical care time: 35 minutes  Critical care time was exclusive of separately billable procedures and treating other patients.  Critical care was necessary to treat or prevent imminent or life-threatening deterioration.  Critical care was time spent personally by me (independent of midlevel providers or residents) on the following activities: development of treatment plan with patient and/or surrogate as well as nursing, discussions with consultants, evaluation of patient's response to treatment, examination of patient, obtaining history from patient or surrogate, ordering and performing treatments and interventions, ordering and review of laboratory studies, ordering and review of radiographic studies, pulse oximetry and re-evaluation of patient's condition.    Length of Stay: [place], MD  05/04/2020, 8:59 AM  Advanced Heart Failure Team Pager [phone] (M-F; 7a - 5p)  Please contact [facility] for night-coverage after hours (5p -7a ) and weekends on [email or web address]

## 2020-05-05 ENCOUNTER — Inpatient Hospital Stay (HOSPITAL_COMMUNITY): Payer: 59

## 2020-05-05 DIAGNOSIS — N179 Acute kidney failure, unspecified: Secondary | ICD-10-CM | POA: Diagnosis not present

## 2020-05-05 DIAGNOSIS — I342 Nonrheumatic mitral (valve) stenosis: Secondary | ICD-10-CM | POA: Diagnosis not present

## 2020-05-05 DIAGNOSIS — J9601 Acute respiratory failure with hypoxia: Secondary | ICD-10-CM | POA: Diagnosis not present

## 2020-05-05 DIAGNOSIS — R570 Cardiogenic shock: Secondary | ICD-10-CM | POA: Diagnosis not present

## 2020-05-05 LAB — CBC
HCT: 23 % — ABNORMAL LOW (ref 36.0–46.0)
Hemoglobin: 7.5 g/dL — ABNORMAL LOW (ref 12.0–15.0)
MCH: 28.6 pg (ref 26.0–34.0)
MCHC: 32.6 g/dL (ref 30.0–36.0)
MCV: 87.8 fL (ref 80.0–100.0)
Platelets: 223 10*3/uL (ref 150–400)
RBC: 2.62 MIL/uL — ABNORMAL LOW (ref 3.87–5.11)
RDW: 14.3 % (ref 11.5–15.5)
WBC: 11 10*3/uL — ABNORMAL HIGH (ref 4.0–10.5)
nRBC: 0.5 % — ABNORMAL HIGH (ref 0.0–0.2)

## 2020-05-05 LAB — BASIC METABOLIC PANEL
Anion gap: 12 (ref 5–15)
BUN: 58 mg/dL — ABNORMAL HIGH (ref 6–20)
CO2: 27 mmol/L (ref 22–32)
Calcium: 9.1 mg/dL (ref 8.9–10.3)
Chloride: 90 mmol/L — ABNORMAL LOW (ref 98–111)
Creatinine, Ser: 2.18 mg/dL — ABNORMAL HIGH (ref 0.44–1.00)
GFR, Estimated: 26 mL/min — ABNORMAL LOW (ref >60)
Glucose, Bld: 180 mg/dL — ABNORMAL HIGH (ref 70–99)
Potassium: 4.1 mmol/L (ref 3.5–5.1)
Sodium: 129 mmol/L — ABNORMAL LOW (ref 135–145)

## 2020-05-05 LAB — COMPREHENSIVE METABOLIC PANEL
ALT: 33 U/L (ref 0–44)
AST: 36 U/L (ref 15–41)
Albumin: 2.6 g/dL — ABNORMAL LOW (ref 3.5–5.0)
Alkaline Phosphatase: 84 U/L (ref 38–126)
Anion gap: 10 (ref 5–15)
BUN: 59 mg/dL — ABNORMAL HIGH (ref 6–20)
CO2: 28 mmol/L (ref 22–32)
Calcium: 8.6 mg/dL — ABNORMAL LOW (ref 8.9–10.3)
Chloride: 89 mmol/L — ABNORMAL LOW (ref 98–111)
Creatinine, Ser: 2.4 mg/dL — ABNORMAL HIGH (ref 0.44–1.00)
GFR, Estimated: 24 mL/min — ABNORMAL LOW (ref >60)
Glucose, Bld: 200 mg/dL — ABNORMAL HIGH (ref 70–99)
Potassium: 4 mmol/L (ref 3.5–5.1)
Sodium: 127 mmol/L — ABNORMAL LOW (ref 135–145)
Total Bilirubin: 0.8 mg/dL (ref 0.3–1.2)
Total Protein: 6.3 g/dL — ABNORMAL LOW (ref 6.5–8.1)

## 2020-05-05 LAB — COOXEMETRY PANEL
Carboxyhemoglobin: 1 % (ref 0.5–1.5)
Methemoglobin: 1 % (ref 0.0–1.5)
O2 Saturation: 50.9 %
Total hemoglobin: 10.6 g/dL — ABNORMAL LOW (ref 12.0–16.0)

## 2020-05-05 LAB — GLUCOSE, CAPILLARY
Glucose-Capillary: 106 mg/dL — ABNORMAL HIGH (ref 70–99)
Glucose-Capillary: 104 mg/dL — ABNORMAL HIGH (ref 70–99)
Glucose-Capillary: 143 mg/dL — ABNORMAL HIGH (ref 70–99)
Glucose-Capillary: 123 mg/dL — ABNORMAL HIGH (ref 70–99)

## 2020-05-05 LAB — PROTIME-INR
INR: 1.5 — ABNORMAL HIGH (ref 0.8–1.2)
Prothrombin Time: 17.9 seconds — ABNORMAL HIGH (ref 11.4–15.2)

## 2020-05-05 LAB — PHOSPHORUS: Phosphorus: 2.9 mg/dL (ref 2.5–4.6)

## 2020-05-05 MED ORDER — WARFARIN SODIUM 2 MG PO TABS: 2.0000 mg | ORAL_TABLET | Freq: Every day | ORAL | Status: DC

## 2020-05-05 MED ORDER — CALCIUM CARBONATE ANTACID 500 MG PO CHEW
1.0000 | CHEWABLE_TABLET | Freq: Three times a day (TID) | ORAL | Status: DC | PRN
  Administered 2020-05-05 (×2): 200 mg via ORAL
  Filled 2020-05-05 (×2): qty 1

## 2020-05-05 MED ORDER — FE FUMARATE-B12-VIT C-FA-IFC PO CAPS
1.0000 | ORAL_CAPSULE | Freq: Every day | ORAL | Status: DC
  Administered 2020-05-05 – 2020-05-11 (×7): 1 via ORAL
  Filled 2020-05-05 (×7): qty 1

## 2020-05-05 MED ORDER — WARFARIN SODIUM 2.5 MG PO TABS
2.5000 mg | ORAL_TABLET | Freq: Every day | ORAL | Status: DC
  Administered 2020-05-05: 2.5 mg via ORAL
  Filled 2020-05-05: qty 1

## 2020-05-05 MED ORDER — TORSEMIDE 20 MG PO TABS
40.0000 mg | ORAL_TABLET | Freq: Two times a day (BID) | ORAL | Status: DC
  Administered 2020-05-05 – 2020-05-11 (×13): 40 mg via ORAL
  Filled 2020-05-05 (×13): qty 2

## 2020-05-05 MED ORDER — HYDRALAZINE HCL 20 MG/ML IJ SOLN
10.0000 mg | Freq: Four times a day (QID) | INTRAMUSCULAR | Status: DC | PRN
  Administered 2020-05-05 – 2020-05-08 (×6): 10 mg via INTRAVENOUS
  Filled 2020-05-05 (×6): qty 1

## 2020-05-05 MED ORDER — POTASSIUM CHLORIDE CRYS ER 20 MEQ PO TBCR
40.0000 meq | EXTENDED_RELEASE_TABLET | Freq: Once | ORAL | Status: AC
  Administered 2020-05-05: 40 meq via ORAL
  Filled 2020-05-05: qty 2

## 2020-05-05 NOTE — Progress Notes (Signed)
      [address]       [place] [address]             [phone]      Sleeping currently   BP (!) 167/81   Pulse (!) 104   Temp 98 F (36.7 C) (Oral)   Resp 18   Ht 5\' 4"  (1.626 m)   Wt 115.6 kg   SpO2 96%   BMI 43.75 kg/m   1l [place] 96% sat  Dopamine at 2, milrinone 0.03  Creatinine slightly improved at 2.18, K = 4.1 CBG well controlled  POD # 6 MVR  Continue current Rx  Madelynne Lasker C. Roxan Hockey, MD [facility] [phone]

## 2020-05-05 NOTE — Progress Notes (Addendum)
Advanced Heart Failure Rounding Note  PCP-Cardiologist: Ida Rogue, MD   Subjective:    Remains on milrionone 0.4 + Dopamine 3. Co-ox marginal at 51%.   -2.7L in UOP yesterday.  Still ~10 lb above pre-op wt. Still feels SOB.   Scr starting to improve, 1.0>1.4>1.8>2.7>3.4>>3.8-> 3.4 -> 2.77->2.40   Na low 127 Hbg 7.5   INR 1.5   Echo 4/15 EF 60-65% MVR ok    Objective:   Weight Range: 115.6 kg Body mass index is 43.75 kg/m.   Vital Signs:   Temp:  [98 F (36.7 C)-98.7 F (37.1 C)] 98 F (36.7 C) (04/18 0743) Pulse Rate:  [97-160] 160 (04/18 0700) Resp:  [13-24] 17 (04/18 0700) BP: (102-153)/(45-114) 132/67 (04/18 0700) SpO2:  [90 %-100 %] 100 % (04/18 0700) Weight:  [115.5 kg-115.6 kg] 115.6 kg (04/18 0500) Last BM Date: 05/04/20  Weight change: Filed Weights   05/03/20 0500 05/04/20 1200 05/05/20 0500  Weight: 117.9 kg 115.5 kg 115.6 kg    Intake/Output:   Intake/Output Summary (Last 24 hours) at 05/05/2020 0758 Last data filed at 05/05/2020 0700 Gross per 24 hour  Intake 1721.24 ml  Output 2725 ml  Net -1003.76 ml      Physical Exam    PHYSICAL EXAM: General:  Well appearing, obese AAF sitting up in chair. No respiratory difficulty HEENT: normal Neck: supple. Thick neck, JVD not well visualized. Carotids 2+ bilat; no bruits. No lymphadenopathy or thyromegaly appreciated. Cor: PMI nondisplaced. Regular rate & rhythm. No rubs, gallops or murmurs. Lungs: decreased BS at the bases, no wheezing  Abdomen: obese, soft, nontender, nondistended. No hepatosplenomegaly. No bruits or masses. Good bowel sounds. Extremities: no cyanosis, clubbing, rash, trace-1+ pretibial edema L>R  Neuro: alert & oriented x 3, cranial nerves grossly intact. moves all 4 extremities w/o difficulty. Affect pleasant.   Telemetry   NSR 90s Personally reviewed  Labs    CBC Recent Labs    05/04/20 0500 05/05/20 0350  WBC 10.0 11.0*  HGB 8.2* 7.5*  HCT 24.6* 23.0*   MCV 86.9 87.8  PLT 172 250   Basic Metabolic Panel Recent Labs    05/04/20 0500 05/04/20 1521 05/05/20 0350  NA 132*  131* 124* 127*  K 2.7*  2.7* 2.7* 4.0  CL 88*  87* 86* 89*  CO2 30  30 29 28   GLUCOSE 120*  119* 270* 200*  BUN 62*  64* 58* 59*  CREATININE 2.77*  2.68* 2.10* 2.40*  CALCIUM 8.8*  8.7* 8.0* 8.6*  MG 2.6*  --   --   PHOS 4.8*  --  2.9   Liver Function Tests Recent Labs    05/04/20 0500 05/05/20 0350  AST 50* 36  ALT 44 33  ALKPHOS 93 84  BILITOT 0.7 0.8  PROT 6.7 6.3*  ALBUMIN 2.9*  2.8* 2.6*   No results for input(s): LIPASE, AMYLASE in the last 72 hours. Cardiac Enzymes No results for input(s): CKTOTAL, CKMB, CKMBINDEX, TROPONINI in the last 72 hours.  BNP: BNP (last 3 results) No results for input(s): BNP in the last 8760 hours.  ProBNP (last 3 results) No results for input(s): PROBNP in the last 8760 hours.   D-Dimer No results for input(s): DDIMER in the last 72 hours. Hemoglobin A1C No results for input(s): HGBA1C in the last 72 hours. Fasting Lipid Panel No results for input(s): CHOL, HDL, LDLCALC, TRIG, CHOLHDL, LDLDIRECT in the last 72 hours. Thyroid Function Tests No results for input(s): TSH, T4TOTAL,  T3FREE, THYROIDAB in the last 72 hours.  Invalid input(s): FREET3  Other results:   Imaging    [place] EKG SITE RITE  Result Date: 05/04/2020 If Site Rite image not attached, placement could not be confirmed due to current cardiac rhythm.    Medications:     Scheduled Medications: . bisacodyl  10 mg Oral Daily   Or  . bisacodyl  10 mg Rectal Daily  . Chlorhexidine Gluconate Cloth  6 each Topical Daily  . docusate sodium  200 mg Oral Daily  . insulin aspart  0-24 Units Subcutaneous TID WC  . mouth rinse  15 mL Mouth Rinse BID  . pantoprazole  40 mg Oral Daily  . polyethylene glycol  17 g Oral Daily  . sodium chloride flush  3 mL Intravenous Q12H  . torsemide  40 mg Oral BID  . warfarin  2 mg Oral q1600  .  Warfarin - Physician Dosing Inpatient   Does not apply q1600    Infusions: . sodium chloride 250 mL (05/04/20 1954)  . DOPamine 3 mcg/kg/min (05/05/20 0700)  . lactated ringers    . milrinone 0.4 mcg/kg/min (05/05/20 0700)    PRN Medications: fentaNYL (SUBLIMAZE) injection, ondansetron (ZOFRAN) IV, sodium chloride flush, traMADol    Patient Profile   55 y/o woman with morbid obesity, HTN, PAF, OSA and rheumtic heart disease with severe MS. Underwent mechanical MVR and maze on 4/12. Post op course c/b pulmonary edema requiring BIPAP + nonoliguric AKI.   Assessment/Plan   1. Severe Mitral Valve Stenosis - 2/2 rheumatic heart disease  - s/p mechanical MVR 4/12 - on warfarin/ASA, INR 1.5.   2. Acute CHF/Pulmonary Edema>>Cardiogenic Shock  - perioperative TEE EF 60-65% with severe mitral stenosis. - developed acute pulmonary edema post op + AKI - initial Co-ox 43% - co-ox 51% today on milrinone 0.4 + Dopamine 3.  - Echo 4/15 EF 60-65% MVR ok  - Good diuresis overnight but remains fluid overload on exam, ~10 lb above pre-op wt. - Nephrology managing diuretics. Plan to stop IV Lasix>>PO torsemide 40 mg bid - Follow response w/ PO diuretics  - wean milrinone to 0.3 and dopamine to 2  - C/w TED hose  3. AKI  - 1.0>1.4>1.8>2.7>3.4>>3.8>3.4>2.8>2.4  - nephrology following  - suspect post-op ATN - Improving. Continue to follow  4. Acute Hypoxemic Respiratory Failure - due to pulmonary edema - much improved with  IV diuresis  5. PAF - s/p MAZE at time of MVR  - in NSR on tele  - INR 1.5 plan lifelong warfarin given mechanical MV   6. Hypokalemia - resolved, K 4.0  - Continue spiro  7. Hyponatremia - Na 127, likely 2/2 hypervolemia - continue diuretics   8. Anemia - Hgb 7.5  - per CT surgery, transfuse for hgb < 7.0     Length of Stay: [address], PA-C  05/05/2020, 7:58 AM  Advanced Heart Failure Team Pager [phone] (M-F; 7a - 5p)  Please contact  [facility] for night-coverage after hours (5p -7a ) and weekends on [email or web address]  Agree with above.   Remains on DA and milrinone. Co-ox marginal. Volume status and SCr improved but still not back to baseline.   Renal changed diuretics back to torsemide.   Remains SOB with exertion. + some nausea.   General:  Sitting in chairNo resp difficulty HEENT: normal Neck: supple.+ JVD Carotids 2+ bilat; no bruits. No lymphadenopathy or thryomegaly appreciated. Cor: PMI nondisplaced. Regular rate & rhythm.  Mechanical s1 Lungs: clear Abdomen: obese soft, nontender, nondistended. No hepatosplenomegaly. No bruits or masses. Good bowel sounds. Extremities: no cyanosis, clubbing, rash, 1+ edema Neuro: alert & orientedx3, cranial nerves grossly intact. moves all 4 extremities w/o difficulty. Affect pleasant  Improving but still tenuous. Would continue current inotropes until we can get a bit more fluid off. Renal managing diuretics. Continue to mobilize. Supp K aggressively. Maintaining NSR. On warfarin INR 1.6  CRITICAL CARE Performed by: Glori Bickers  Total critical care time: 35 minutes  Critical care time was exclusive of separately billable procedures and treating other patients.  Critical care was necessary to treat or prevent imminent or life-threatening deterioration.  Critical care was time spent personally by me (independent of midlevel providers or residents) on the following activities: development of treatment plan with patient and/or surrogate as well as nursing, discussions with consultants, evaluation of patient's response to treatment, examination of patient, obtaining history from patient or surrogate, ordering and performing treatments and interventions, ordering and review of laboratory studies, ordering and review of radiographic studies, pulse oximetry and re-evaluation of patient's condition.  Glori Bickers, MD  8:49 AM

## 2020-05-05 NOTE — Progress Notes (Addendum)
[facility] DAILY ICU PROGRESS NOTE                   [name]            [place] [address]          [phone]   6 Days Post-Op Procedure(s) (LRB): MITRAL VALVE (MV) REPLACEMENT USING ON-X VALVE SIZE 27/29MM (N/A) MAZE (N/A) TRANSESOPHAGEAL ECHOCARDIOGRAM (TEE) (N/A) CLIPPING OF ATRIAL APPENDAGE USING ATRICURE  PRO2 CLIP SIZE 45MM  Total Length of Stay:  LOS: 6 days   Subjective: Some nausea, weak but improving  Objective: Vital signs in last 24 hours: Temp:  [98.2 F (36.8 C)-98.7 F (37.1 C)] 98.6 F (37 C) (04/18 0400) Pulse Rate:  [97-160] 160 (04/18 0700) Cardiac Rhythm: Normal sinus rhythm (04/18 0400) Resp:  [13-24] 17 (04/18 0700) BP: (102-153)/(45-114) 132/67 (04/18 0700) SpO2:  [90 %-100 %] 100 % (04/18 0700) Weight:  [115.5 kg-115.6 kg] 115.6 kg (04/18 0500)  Filed Weights   05/03/20 0500 05/04/20 1200 05/05/20 0500  Weight: 117.9 kg 115.5 kg 115.6 kg    Weight change:    Hemodynamic parameters for last 24 hours: CVP:  [15 mmHg-24 mmHg] 15 mmHg  Intake/Output from previous day: 04/17 0701 - 04/18 0700 In: 1721.2 [P.O.:730; I.V.:497.6; IV Piggyback:493.6] Out: 2725 [Urine:2725]  Intake/Output this shift: No intake/output data recorded.  Current Meds: Scheduled Meds: . bisacodyl  10 mg Oral Daily   Or  . bisacodyl  10 mg Rectal Daily  . Chlorhexidine Gluconate Cloth  6 each Topical Daily  . docusate sodium  200 mg Oral Daily  . furosemide  80 mg Intravenous BID  . insulin aspart  0-24 Units Subcutaneous TID WC  . mouth rinse  15 mL Mouth Rinse BID  . pantoprazole  40 mg Oral Daily  . polyethylene glycol  17 g Oral Daily  . sodium chloride flush  3 mL Intravenous Q12H  . spironolactone  25 mg Oral BID  . warfarin  1.5 mg Oral q1600  . Warfarin - Physician Dosing Inpatient   Does not apply q1600   Continuous Infusions: . sodium chloride 250 mL (05/04/20 1954)  . DOPamine 3 mcg/kg/min (05/05/20 0700)  . lactated ringers     . milrinone 0.4 mcg/kg/min (05/05/20 0700)   PRN Meds:.fentaNYL (SUBLIMAZE) injection, ondansetron (ZOFRAN) IV, sodium chloride flush, traMADol  General appearance: alert, cooperative and no distress Heart: regular rate and rhythm Lungs: some basilar crackes Abdomen: obese, nontender Extremities: minor edema Wound: healing well  Lab Results: CBC: Recent Labs    05/04/20 0500 05/05/20 0350  WBC 10.0 11.0*  HGB 8.2* 7.5*  HCT 24.6* 23.0*  PLT 172 223   BMET:  Recent Labs    05/04/20 1521 05/05/20 0350  NA 124* 127*  K 2.7* 4.0  CL 86* 89*  CO2 29 28  GLUCOSE 270* 200*  BUN 58* 59*  CREATININE 2.10* 2.40*  CALCIUM 8.0* 8.6*    CMET: Lab Results  Component Value Date   WBC 11.0 (H) 05/05/2020   HGB 7.5 (L) 05/05/2020   HCT 23.0 (L) 05/05/2020   PLT 223 05/05/2020   GLUCOSE 200 (H) 05/05/2020   CHOL 204 (H) 03/07/2020   TRIG 224 (H) 03/07/2020   HDL 35 (L) 03/07/2020   LDLCALC 129 (H) 03/07/2020   ALT 33 05/05/2020   AST 36 05/05/2020   NA 127 (L) 05/05/2020   K 4.0 05/05/2020   CL 89 (L) 05/05/2020   CREATININE 2.40 (  H) 05/05/2020   BUN 59 (H) 05/05/2020   CO2 28 05/05/2020   TSH 0.250 (L) 03/07/2020   INR 1.5 (H) 05/05/2020   HGBA1C 6.5 (H) 04/25/2020      PT/INR:  Recent Labs    05/05/20 0350  LABPROT 17.9*  INR 1.5*   Radiology: [place] EKG SITE RITE  Result Date: 05/04/2020 If Site Rite image not attached, placement could not be confirmed due to current cardiac rhythm.    Assessment/Plan: S/P Procedure(s) (LRB): MITRAL VALVE (MV) REPLACEMENT USING ON-X VALVE SIZE 27/29MM (N/A) MAZE (N/A) TRANSESOPHAGEAL ECHOCARDIOGRAM (TEE) (N/A) CLIPPING OF ATRIAL APPENDAGE USING ATRICURE  PRO2 CLIP SIZE 45MM  1 afeb, VSS SBP variable 100-150 range,mostly well controlled. SR, STachy  CO-OX 50- on dopamine/milrinone 2 sat sok om RA 3 H/H down a bit further, approaching transfusion threshold w/HGB 7.5- follow closely 4 minor leukocytosis  5  hyponatremia w/ sodium 127. K+ currently normal 6 BS well controlled 7 excellent diuresis, weight slowly trending down, creat has plateaued- trends are improved, 2.40 today, BUN stable at 59- non-oliguric AKI, ATN 8 INR 1.5- will increase dose to 2 mg 9 appreciate ne[hrology and AHF team managmemt 10 CXR bilat pulm  edema pattern may be a little worse, component of atx - push pulm hygeine as able 11 will get PT consult   John Giovanni PA-C Pager [phone] 05/05/2020 7:16 AM   I have seen and examined the patient and agree with the assessment and plan as outlined.  Overall much improved.  Still c/o dyspnea with ambulation but this is chronic and present pre-op.  Maintaining NSR w/ stable BP.  Mixed venous co-ox down 50% but anemia worse and no signs of worsening CHF on exam.  Weight approaching baseline and creatinine actually up slightly.  CXR clear.  Needs PT.  Mobilize.  D/C pacing wires.  Increase Coumadin.  Rexene Alberts, MD 05/05/2020 8:13 AM

## 2020-05-05 NOTE — Progress Notes (Signed)
[facility] ROUNDING NOTE   Subjective:   Brief history: This is a 55 year old lady with past medical history of mitral stenosis chronic kidney disease with a baseline serum creatinine 1.1 to 1.3 mg/dL.  She underwent mitral valve replacement with maze and left atrial appendage clipping on 04/29/2020.  Hospital course complicated by severe nonoliguric acute kidney injury secondary to acute tubular necrosis.  Urinalysis bland with trace RBCs noted on high-powered field.  Renal ultrasound without hydronephrosis did have some mild symmetric bladder wall thickening.  Blood pressure 132/62 pulse 102 temperature 98.6 O2 sats 95% room air.  Urine output 4.2 L 05/04/2020  IV dopamine IV milrinone  Sodium 127 potassium 4 chloride 89 CO2 28 BUN 59 creatinine 2.4 glucose 200 phosphorus 2.9 calcium 8.6 albumin 2.6.   IV Lasix 80 mg twice daily.  Administered 05/04/2020.  Was previously on Lasix drip.   Objective:  Vital signs in last 24 hours:  Temp:  [98.2 F (36.8 C)-98.7 F (37.1 C)] 98.6 F (37 C) (04/18 0400) Pulse Rate:  [97-160] 160 (04/18 0700) Resp:  [13-24] 17 (04/18 0700) BP: (102-153)/(45-114) 132/67 (04/18 0700) SpO2:  [90 %-100 %] 100 % (04/18 0700) Weight:  [115.5 kg-115.6 kg] 115.6 kg (04/18 0500)  Weight change:  Filed Weights   05/03/20 0500 05/04/20 1200 05/05/20 0500  Weight: 117.9 kg 115.5 kg 115.6 kg    Intake/Output: I/O last 3 completed shifts: In: 2255 [P.O.:850; I.V.:911.3; IV Piggyback:493.6] Out: 0254 [Urine:5775]   Intake/Output this shift:  No intake/output data recorded.  Constitutional: sitting in chair, no distress ENMT: ears and nose without scars or lesions, MMM CV: normal rate, trace bilateral LEE Respiratory: Bilateral chest rise with no increased work of breathing Gastrointestinal: soft, non-tender, no palpable masses or hernias Skin: no visible lesions or rashes Psych: alert, judgement/insight appropriate, appropriate mood and  affect   Basic Metabolic Panel: Recent Labs  Lab 04/29/20 2016 04/29/20 2308 04/30/20 0420 04/30/20 0610 04/30/20 1654 04/30/20 2026 05/03/20 0349 05/03/20 1600 05/04/20 0500 05/04/20 1521 05/05/20 0350  NA 139   < > 140   < > 134*   < > 131* 130* 132*  131* 124* 127*  K 4.3   < > 3.7   < > 3.9   < > 3.2* 2.8* 2.7*  2.7* 2.7* 4.0  CL 111  --  110  --  104   < > 93* 89* 88*  87* 86* 89*  CO2 23  --  22  --  22   < > 25 28 30  30 29 28   GLUCOSE 160*  --  146*  --  193*   < > 122* 129* 120*  119* 270* 200*  BUN 16  --  19  --  26*   < > 57* 62* 62*  64* 58* 59*  CREATININE 1.41*  --  1.75*  --  2.66*   < > 3.44* 3.02* 2.77*  2.68* 2.10* 2.40*  CALCIUM 7.2*  --  7.7*  --  8.1*   < > 8.6* 8.5* 8.8*  8.7* 8.0* 8.6*  MG 3.5*  --  3.2*  --  2.9*  --   --   --  2.6*  --   --   PHOS  --   --   --   --   --   --   --   --  4.8*  --  2.9   < > = values in this interval not displayed.  Liver Function Tests: Recent Labs  Lab 05/02/20 0409 05/03/20 0349 05/04/20 0500 05/05/20 0350  AST 116* 73* 50* 36  ALT 54* 49* 44 33  ALKPHOS 83 101 93 84  BILITOT 0.7 0.6 0.7 0.8  PROT 6.4* 6.4* 6.7 6.3*  ALBUMIN 3.0* 2.8* 2.9*  2.8* 2.6*   No results for input(s): LIPASE, AMYLASE in the last 168 hours. No results for input(s): AMMONIA in the last 168 hours.  CBC: Recent Labs  Lab 05/01/20 0328 05/02/20 0409 05/03/20 0349 05/04/20 0500 05/05/20 0350  WBC 18.2* 14.1* 11.2* 10.0 11.0*  HGB 9.2* 8.1* 7.7* 8.2* 7.5*  HCT 27.6* 24.0* 23.0* 24.6* 23.0*  MCV 87.3 87.0 87.8 86.9 87.8  PLT 145* 130* 150 172 223    Cardiac Enzymes: No results for input(s): CKTOTAL, CKMB, CKMBINDEX, TROPONINI in the last 168 hours.  BNP: Invalid input(s): POCBNP  CBG: Recent Labs  Lab 05/04/20 0654 05/04/20 1125 05/04/20 1521 05/04/20 2009 05/05/20 0648  GLUCAP 125* 123* 120* 125* 106*    Microbiology: Results for orders placed or performed during the hospital encounter of 04/25/20   Surgical pcr screen     Status: None   Collection Time: 04/25/20 10:06 AM   Specimen: Nasal Mucosa; Nasal Swab  Result Value Ref Range Status   MRSA, PCR NEGATIVE NEGATIVE Final   Staphylococcus aureus NEGATIVE NEGATIVE Final    Comment: (NOTE) The Xpert SA Assay (FDA approved for NASAL specimens in patients 58 years of age and older), is one component of a comprehensive surveillance program. It is not intended to diagnose infection nor to guide or monitor treatment. Performed at [facility], [place] [address], [place], [place] [address]     Coagulation Studies: Recent Labs    05/03/20 0349 05/04/20 0500 05/05/20 0350  LABPROT 24.2* 19.3* 17.9*  INR 2.2* 1.6* 1.5*    Urinalysis: No results for input(s): COLORURINE, LABSPEC, PHURINE, GLUCOSEU, HGBUR, BILIRUBINUR, KETONESUR, PROTEINUR, UROBILINOGEN, NITRITE, LEUKOCYTESUR in the last 72 hours.  Invalid input(s): APPERANCEUR    Imaging: DG Chest Port 1 View  Result Date: 05/04/2020 CLINICAL DATA:  Status post mitral valve replacement. EXAM: PORTABLE CHEST 1 VIEW COMPARISON:  Is chest x-rays dated 05/03/2020 and 05/02/2020. FINDINGS: Heart size and mediastinal contours are stable. Cardiac valve hardware appears stable in position. RIGHT IJ Cordis remains in place. Bibasilar opacities are slightly improved. No new lung findings. No pneumothorax is seen. IMPRESSION: Slightly improved aeration at both lung bases suggesting resolving atelectasis and/or edema. No new lung findings. Electronically Signed   By: Franki Cabot M.D.   On: 05/04/2020 08:15   [place] EKG SITE RITE  Result Date: 05/04/2020 If Site Rite image not attached, placement could not be confirmed due to current cardiac rhythm.    Medications:   . sodium chloride 250 mL (05/04/20 1954)  . DOPamine 3 mcg/kg/min (05/05/20 0700)  . lactated ringers    . milrinone 0.4 mcg/kg/min (05/05/20 0700)   . bisacodyl  10 mg Oral Daily   Or  . bisacodyl  10 mg Rectal  Daily  . Chlorhexidine Gluconate Cloth  6 each Topical Daily  . docusate sodium  200 mg Oral Daily  . furosemide  80 mg Intravenous BID  . insulin aspart  0-24 Units Subcutaneous TID WC  . mouth rinse  15 mL Mouth Rinse BID  . pantoprazole  40 mg Oral Daily  . polyethylene glycol  17 g Oral Daily  . sodium chloride flush  3 mL Intravenous Q12H  .  spironolactone  25 mg Oral BID  . warfarin  1.5 mg Oral q1600  . Warfarin - Physician Dosing Inpatient   Does not apply q1600   fentaNYL (SUBLIMAZE) injection, ondansetron (ZOFRAN) IV, sodium chloride flush, traMADol  Assessment/ Plan:  1. Non-Oliguric AKI, severe, improving: Likely secondary to ATN from hypoperfusion during surgery.  No signs of obvious obstruction.  Creatinine  slight increase noted without any obvious signs of nephrotoxin administration.  No hemodynamic instability  -Continue to monitor daily Cr, Dose meds for GFR -Monitor Daily I/Os, Daily weight  -Maintain MAP>65 for optimal renal perfusion.  -Avoid nephrotoxic medications including NSAIDs and Vanc/Zosyn combo  2. Volume overload/heart failure exacerbation: Volume overload related to surgery, decreased renal function, heart failure.  Volume status improving with excellent urine output  -Receiving Lasix 80 mg twice daily;  home medication was Demadex 40 mg twice daily.  Will transition to oral diuretics 05/05/2020 -Hypokalemia appears to be resolved.  Is having a phenomenal diuretic response.  We will discontinue spironolactone at this point. -Continue milrinone and dopamine per primary  3. Mitral valve stenosis: s/p repair. Surgery managing.  Anticoagulation with Coumadin  4. Anemia: Likely multifactorial with acute illness contributing. Management per primary and transfuse prn  5. Hypokalemia:  Will discontinue spironolactone at this point.  Continue to follow.  Appears to have resolved   LOS: [place] @TODAY @7 :17 AM

## 2020-05-06 ENCOUNTER — Ambulatory Visit: Payer: 59 | Admitting: Internal Medicine

## 2020-05-06 ENCOUNTER — Inpatient Hospital Stay (HOSPITAL_COMMUNITY): Payer: 59

## 2020-05-06 DIAGNOSIS — Z954 Presence of other heart-valve replacement: Secondary | ICD-10-CM | POA: Diagnosis not present

## 2020-05-06 DIAGNOSIS — I342 Nonrheumatic mitral (valve) stenosis: Secondary | ICD-10-CM | POA: Diagnosis not present

## 2020-05-06 DIAGNOSIS — J9601 Acute respiratory failure with hypoxia: Secondary | ICD-10-CM | POA: Diagnosis not present

## 2020-05-06 DIAGNOSIS — N179 Acute kidney failure, unspecified: Secondary | ICD-10-CM | POA: Diagnosis not present

## 2020-05-06 LAB — CBC
HCT: 23.5 % — ABNORMAL LOW (ref 36.0–46.0)
Hemoglobin: 7.8 g/dL — ABNORMAL LOW (ref 12.0–15.0)
MCH: 29.4 pg (ref 26.0–34.0)
MCHC: 33.2 g/dL (ref 30.0–36.0)
MCV: 88.7 fL (ref 80.0–100.0)
Platelets: 325 10*3/uL (ref 150–400)
RBC: 2.65 MIL/uL — ABNORMAL LOW (ref 3.87–5.11)
RDW: 14.8 % (ref 11.5–15.5)
WBC: 12 10*3/uL — ABNORMAL HIGH (ref 4.0–10.5)
nRBC: 0.4 % — ABNORMAL HIGH (ref 0.0–0.2)

## 2020-05-06 LAB — COOXEMETRY PANEL
Carboxyhemoglobin: 1.4 % (ref 0.5–1.5)
Methemoglobin: 1.3 % (ref 0.0–1.5)
O2 Saturation: 58.2 %
Total hemoglobin: 9 g/dL — ABNORMAL LOW (ref 12.0–16.0)

## 2020-05-06 LAB — RENAL FUNCTION PANEL
Albumin: 2.7 g/dL — ABNORMAL LOW (ref 3.5–5.0)
Anion gap: 12 (ref 5–15)
BUN: 63 mg/dL — ABNORMAL HIGH (ref 6–20)
CO2: 28 mmol/L (ref 22–32)
Calcium: 9.2 mg/dL (ref 8.9–10.3)
Chloride: 92 mmol/L — ABNORMAL LOW (ref 98–111)
Creatinine, Ser: 2.54 mg/dL — ABNORMAL HIGH (ref 0.44–1.00)
GFR, Estimated: 22 mL/min — ABNORMAL LOW (ref >60)
Glucose, Bld: 109 mg/dL — ABNORMAL HIGH (ref 70–99)
Phosphorus: 3.3 mg/dL (ref 2.5–4.6)
Potassium: 4.3 mmol/L (ref 3.5–5.1)
Sodium: 132 mmol/L — ABNORMAL LOW (ref 135–145)

## 2020-05-06 LAB — GLUCOSE, CAPILLARY
Glucose-Capillary: 118 mg/dL — ABNORMAL HIGH (ref 70–99)
Glucose-Capillary: 101 mg/dL — ABNORMAL HIGH (ref 70–99)
Glucose-Capillary: 127 mg/dL — ABNORMAL HIGH (ref 70–99)
Glucose-Capillary: 114 mg/dL — ABNORMAL HIGH (ref 70–99)

## 2020-05-06 LAB — PROTIME-INR
INR: 1.4 — ABNORMAL HIGH (ref 0.8–1.2)
Prothrombin Time: 17.3 seconds — ABNORMAL HIGH (ref 11.4–15.2)

## 2020-05-06 LAB — PREPARE RBC (CROSSMATCH)

## 2020-05-06 MED ORDER — SODIUM CHLORIDE 0.9% IV SOLUTION: Freq: Once | INTRAVENOUS | Status: AC

## 2020-05-06 MED ORDER — ENOXAPARIN SODIUM 30 MG/0.3ML ~~LOC~~ SOLN
30.0000 mg | SUBCUTANEOUS | Status: DC
  Administered 2020-05-06 – 2020-05-11 (×6): 30 mg via SUBCUTANEOUS
  Filled 2020-05-06 (×6): qty 0.3

## 2020-05-06 MED ORDER — FUROSEMIDE 10 MG/ML IJ SOLN
80.0000 mg | Freq: Once | INTRAMUSCULAR | Status: AC
  Administered 2020-05-06: 80 mg via INTRAVENOUS
  Filled 2020-05-06: qty 8

## 2020-05-06 MED ORDER — WARFARIN SODIUM 2 MG PO TABS
4.0000 mg | ORAL_TABLET | Freq: Every day | ORAL | Status: DC
  Administered 2020-05-06: 4 mg via ORAL
  Filled 2020-05-06: qty 2

## 2020-05-06 MED ORDER — PANTOPRAZOLE SODIUM 40 MG PO TBEC
40.0000 mg | DELAYED_RELEASE_TABLET | Freq: Two times a day (BID) | ORAL | Status: DC
  Administered 2020-05-07 – 2020-05-11 (×9): 40 mg via ORAL
  Filled 2020-05-06 (×9): qty 1

## 2020-05-06 MED ORDER — SODIUM CHLORIDE 0.9% IV SOLUTION: Freq: Once | INTRAVENOUS | Status: DC

## 2020-05-06 NOTE — Progress Notes (Addendum)
Advanced Heart Failure Rounding Note  PCP-Cardiologist: Ida Rogue, MD   Subjective:    Remains on milrionone 0.3 + Dopamine 1. Co-ox marginal at 58%.   Switched to PO diuretics yesterday. -2.3L in UOP. Wt down 1 lb. CVP 11  Scr/BUN up slightly SCr 2.40>>2.54 BUN 59>>63  Na trending up 127>>132.   INR 1.4. Hgb 7.8  Sleepy this morning. Felt lightheaded yesterday w/ ambulation. Improved some today. No dyspnea.   Echo 4/15 EF 60-65% MVR ok    Objective:   Weight Range: 114.9 kg Body mass index is 43.48 kg/m.   Vital Signs:   Temp:  [97.6 F (36.4 C)-98.6 F (37 C)] 98.5 F (36.9 C) (04/19 0800) Pulse Rate:  [100-124] 104 (04/19 0800) Resp:  [14-29] 17 (04/19 0800) BP: (117-175)/(53-118) 155/71 (04/19 0800) SpO2:  [91 %-99 %] 98 % (04/19 0800) Weight:  [114.9 kg] 114.9 kg (04/19 0500) Last BM Date: 05/05/20  Weight change: Filed Weights   05/04/20 1200 05/05/20 0500 05/06/20 0500  Weight: 115.5 kg 115.6 kg 114.9 kg    Intake/Output:   Intake/Output Summary (Last 24 hours) at 05/06/2020 0850 Last data filed at 05/06/2020 0800 Gross per 24 hour  Intake 1397.06 ml  Output 2175 ml  Net -777.94 ml      Physical Exam    CVP 11  General:  Well appearing, obese, sitting in chair No respiratory difficulty HEENT: normal Neck: supple. Thick neck, JVD not well visualized. Carotids 2+ bilat; no bruits. No lymphadenopathy or thyromegaly appreciated. Cor: PMI nondisplaced. Regular rate & rhythm. No rubs, gallops or murmurs. Lungs: CTAB. No wheezing  Abdomen: obese, soft, nontender, nondistended. No hepatosplenomegaly. No bruits or masses. Good bowel sounds. Extremities: no cyanosis, clubbing, rash, trace-1+ pretibial edema   Neuro: alert & oriented x 3, cranial nerves grossly intact. moves all 4 extremities w/o difficulty. Affect pleasant.   Telemetry   NSR 90s Personally reviewed  Labs    CBC Recent Labs    05/05/20 0350 05/06/20 0441  WBC  11.0* 12.0*  HGB 7.5* 7.8*  HCT 23.0* 23.5*  MCV 87.8 88.7  PLT 223 756   Basic Metabolic Panel Recent Labs    05/04/20 0500 05/04/20 1521 05/05/20 0350 05/05/20 1652 05/06/20 0441  NA 132*  131*   < > 127* 129* 132*  K 2.7*  2.7*   < > 4.0 4.1 4.3  CL 88*  87*   < > 89* 90* 92*  CO2 30  30   < > 28 27 28   GLUCOSE 120*  119*   < > 200* 180* 109*  BUN 62*  64*   < > 59* 58* 63*  CREATININE 2.77*  2.68*   < > 2.40* 2.18* 2.54*  CALCIUM 8.8*  8.7*   < > 8.6* 9.1 9.2  MG 2.6*  --   --   --   --   PHOS 4.8*  --  2.9  --  3.3   < > = values in this interval not displayed.   Liver Function Tests Recent Labs    05/04/20 0500 05/05/20 0350 05/06/20 0441  AST 50* 36  --   ALT 44 33  --   ALKPHOS 93 84  --   BILITOT 0.7 0.8  --   PROT 6.7 6.3*  --   ALBUMIN 2.9*  2.8* 2.6* 2.7*   No results for input(s): LIPASE, AMYLASE in the last 72 hours. Cardiac Enzymes No results for input(s): CKTOTAL, CKMB, CKMBINDEX,  TROPONINI in the last 72 hours.  BNP: BNP (last 3 results) No results for input(s): BNP in the last 8760 hours.  ProBNP (last 3 results) No results for input(s): PROBNP in the last 8760 hours.   D-Dimer No results for input(s): DDIMER in the last 72 hours. Hemoglobin A1C No results for input(s): HGBA1C in the last 72 hours. Fasting Lipid Panel No results for input(s): CHOL, HDL, LDLCALC, TRIG, CHOLHDL, LDLDIRECT in the last 72 hours. Thyroid Function Tests No results for input(s): TSH, T4TOTAL, T3FREE, THYROIDAB in the last 72 hours.  Invalid input(s): FREET3  Other results:   Imaging    No results found.   Medications:     Scheduled Medications: . sodium chloride   Intravenous Once  . bisacodyl  10 mg Oral Daily   Or  . bisacodyl  10 mg Rectal Daily  . Chlorhexidine Gluconate Cloth  6 each Topical Daily  . docusate sodium  200 mg Oral Daily  . ferrous [id number] C-folic acid  1 capsule Oral Q breakfast  . furosemide  80 mg  Intravenous Once  . insulin aspart  0-24 Units Subcutaneous TID WC  . mouth rinse  15 mL Mouth Rinse BID  . pantoprazole  40 mg Oral Daily  . polyethylene glycol  17 g Oral Daily  . sodium chloride flush  3 mL Intravenous Q12H  . torsemide  40 mg Oral BID  . warfarin  4 mg Oral q1600  . Warfarin - Physician Dosing Inpatient   Does not apply q1600    Infusions: . sodium chloride 250 mL (05/04/20 1954)  . DOPamine 1 mcg/kg/min (05/06/20 0809)  . lactated ringers    . milrinone 0.3 mcg/kg/min (05/06/20 0800)    PRN Medications: calcium carbonate, fentaNYL (SUBLIMAZE) injection, hydrALAZINE, ondansetron (ZOFRAN) IV, sodium chloride flush, traMADol    Patient Profile   55 y/o woman with morbid obesity, HTN, PAF, OSA and rheumtic heart disease with severe MS. Underwent mechanical MVR and maze on 4/12. Post op course c/b pulmonary edema requiring BIPAP + nonoliguric AKI.   Assessment/Plan   1. Severe Mitral Valve Stenosis - 2/2 rheumatic heart disease  - s/p mechanical MVR 4/12 - on warfarin/ASA, INR 1.4.   2. Acute CHF/Pulmonary Edema>>Cardiogenic Shock  - perioperative TEE EF 60-65% with severe mitral stenosis. - developed acute pulmonary edema post op + AKI - initial Co-ox 43% - co-ox 58% today on milrinone 0.3 + Dopamine 1.  - Echo 4/15 EF 60-65% MVR ok  - Good diuresis overnight but remains mildly fluid overload on exam, 8 lb above pre-op wt. CVP 11  - Nephrology managing diuretics. Continue PO torsemide 40 mg bid -  Continue slow wean of milrinone and dopamine - C/w TED hose  3. AKI  - 1.0>1.4>1.8>2.7>3.4>>3.8>3.4>2.8>2.4>2.5   - nephrology following  - suspect post-op ATN - Improving. Continue to follow  4. Acute Hypoxemic Respiratory Failure - due to pulmonary edema - much improved with  IV diuresis  5. PAF - s/p MAZE at time of MVR  - in NSR on tele  - INR 1.4 plan lifelong warfarin given mechanical MV   6. Hypokalemia - resolved, K 4.3 - Continue  spiro  7. Hyponatremia - 2/2 hypervolemia - improving w/ diuresis, NA 127>>132 - continue diuretics   8. Anemia - Hgb 7.8 - per CT surgery, transfuse for hgb < 7.0   9. Hypertension - SBPs low 150s this am - monitor, may need PRN IV hydralazine - avoid aggressive BP lowering  given renal fx    Length of Stay: [address] PA-C  05/06/2020, 8:50 AM  Advanced Heart Failure Team Pager [phone] (M-F; 7a - 5p)  Please contact [facility] for night-coverage after hours (5p -7a ) and weekends on [email or web address]   Patient seen and examined with the above-signed Advanced Practice Provider and/or Housestaff. I personally reviewed laboratory data, imaging studies and relevant notes. I independently examined the patient and formulated the important aspects of the plan. I have edited the note to reflect any of my changes or salient points. I have personally discussed the plan with the patient and/or family.  Remains on milrinone and DA. Co-ox improved to 58%. Good diuresis on po torsemide but weight unchanged. CVP 11. SCr hanging at 2.4-2.5  General:  Fatigued appearing. No resp difficulty HEENT: normal Neck: supple. JVP to jaw . Carotids 2+ bilat; no bruits. No lymphadenopathy or thryomegaly appreciated. Cor: PMI nondisplaced. Regular rate & rhythm. Mechanical s2 Lungs: clear Abdomen: soft, nontender, nondistended. No hepatosplenomegaly. No bruits or masses. Good bowel sounds. Extremities: no cyanosis, clubbing, rash, 1+ edema Neuro: alert & orientedx3, cranial nerves grossly intact. moves all 4 extremities w/o difficulty. Affect pleasant  Improving slowly. Would continue milrinone for now. Can wean DA off. Diuretics per Renal. She is nearing baseline weight and respiratory status improved. May be able to hold diuretics for a day or two if Scr doesn't continue to improve. INR 1.4 May need low dose heparin soon for MVR.   Glori Bickers, MD  9:54 AM

## 2020-05-06 NOTE — Progress Notes (Signed)
[facility] ROUNDING NOTE   Subjective:   Brief history: This is a 55 year old lady with past medical history of mitral stenosis chronic kidney disease with a baseline serum creatinine 1.1 to 1.3 mg/dL.  She underwent mitral valve replacement with maze and left atrial appendage clipping on 04/29/2020.  Hospital course complicated by severe nonoliguric acute kidney injury secondary to acute tubular necrosis.  Urinalysis bland with trace RBCs noted on high-powered field.  Renal ultrasound without hydronephrosis did have some mild symmetric bladder wall thickening.  Blood pressure 170/82 pulse 102 temperature 98.5 O2 sats 98% 2 L nasal cannula.  Urine output 2.2 L 05/05/2020  IV dopamine IV milrinone  Sodium 132 potassium 4.3 chloride 92 CO2 28 BUN 60 creatinine 2.54 glucose 109 calcium 9.2 albumin 2.7 hemoglobin 7.8   IV Lasix 80 mg twice daily.  Administered 05/04/2020.  Was previously on Lasix drip.   Objective:  Vital signs in last 24 hours:  Temp:  [97.6 F (36.4 C)-98.6 F (37 C)] 98.5 F (36.9 C) (04/19 0645) Pulse Rate:  [99-124] 106 (04/19 0700) Resp:  [14-29] 22 (04/19 0700) BP: (117-175)/(53-118) 138/118 (04/19 0700) SpO2:  [91 %-99 %] 99 % (04/19 0700) Weight:  [114.9 kg] 114.9 kg (04/19 0500)  Weight change: -0.6 kg Filed Weights   05/04/20 1200 05/05/20 0500 05/06/20 0500  Weight: 115.5 kg 115.6 kg 114.9 kg    Intake/Output: I/O last 3 completed shifts: In: 2149.9 [P.O.:1310; I.V.:581.7; IV Piggyback:258.3] Out: 9741 [Urine:3380]   Intake/Output this shift:  No intake/output data recorded.  Constitutional: sitting in chair, no distress ENMT: ears and nose without scars or lesions, MMM CV: normal rate, trace bilateral LEE Respiratory: Bilateral chest rise with no increased work of breathing Gastrointestinal: soft, non-tender, no palpable masses or hernias Skin: no visible lesions or rashes Psych: alert, judgement/insight appropriate, appropriate  mood and affect   Basic Metabolic Panel: Recent Labs  Lab 04/29/20 2016 04/29/20 2308 04/30/20 0420 04/30/20 0610 04/30/20 1654 04/30/20 2026 05/04/20 0500 05/04/20 1521 05/05/20 0350 05/05/20 1652 05/06/20 0441  NA 139   < > 140   < > 134*   < > 132*  131* 124* 127* 129* 132*  K 4.3   < > 3.7   < > 3.9   < > 2.7*  2.7* 2.7* 4.0 4.1 4.3  CL 111  --  110  --  104   < > 88*  87* 86* 89* 90* 92*  CO2 23  --  22  --  22   < > 30  30 29 28 27 28   GLUCOSE 160*  --  146*  --  193*   < > 120*  119* 270* 200* 180* 109*  BUN 16  --  19  --  26*   < > 62*  64* 58* 59* 58* 63*  CREATININE 1.41*  --  1.75*  --  2.66*   < > 2.77*  2.68* 2.10* 2.40* 2.18* 2.54*  CALCIUM 7.2*  --  7.7*  --  8.1*   < > 8.8*  8.7* 8.0* 8.6* 9.1 9.2  MG 3.5*  --  3.2*  --  2.9*  --  2.6*  --   --   --   --   PHOS  --   --   --   --   --   --  4.8*  --  2.9  --  3.3   < > = values in this interval not displayed.  Liver Function Tests: Recent Labs  Lab 05/02/20 0409 05/03/20 0349 05/04/20 0500 05/05/20 0350 05/06/20 0441  AST 116* 73* 50* 36  --   ALT 54* 49* 44 33  --   ALKPHOS 83 101 93 84  --   BILITOT 0.7 0.6 0.7 0.8  --   PROT 6.4* 6.4* 6.7 6.3*  --   ALBUMIN 3.0* 2.8* 2.9*  2.8* 2.6* 2.7*   No results for input(s): LIPASE, AMYLASE in the last 168 hours. No results for input(s): AMMONIA in the last 168 hours.  CBC: Recent Labs  Lab 05/02/20 0409 05/03/20 0349 05/04/20 0500 05/05/20 0350 05/06/20 0441  WBC 14.1* 11.2* 10.0 11.0* 12.0*  HGB 8.1* 7.7* 8.2* 7.5* 7.8*  HCT 24.0* 23.0* 24.6* 23.0* 23.5*  MCV 87.0 87.8 86.9 87.8 88.7  PLT 130* 150 172 223 325    Cardiac Enzymes: No results for input(s): CKTOTAL, CKMB, CKMBINDEX, TROPONINI in the last 168 hours.  BNP: Invalid input(s): POCBNP  CBG: Recent Labs  Lab 05/05/20 0648 05/05/20 1112 05/05/20 1519 05/05/20 2020 05/06/20 0644  GLUCAP 106* 104* 143* 123* 118*    Microbiology: Results for orders placed or  performed during the hospital encounter of 04/25/20  Surgical pcr screen     Status: None   Collection Time: 04/25/20 10:06 AM   Specimen: Nasal Mucosa; Nasal Swab  Result Value Ref Range Status   MRSA, PCR NEGATIVE NEGATIVE Final   Staphylococcus aureus NEGATIVE NEGATIVE Final    Comment: (NOTE) The Xpert SA Assay (FDA approved for NASAL specimens in patients 74 years of age and older), is one component of a comprehensive surveillance program. It is not intended to diagnose infection nor to guide or monitor treatment. Performed at [facility], [place] [address], [place], [place] [address]     Coagulation Studies: Recent Labs    05/04/20 0500 05/05/20 0350 05/06/20 0441  LABPROT 19.3* 17.9* 17.3*  INR 1.6* 1.5* 1.4*    Urinalysis: No results for input(s): COLORURINE, LABSPEC, PHURINE, GLUCOSEU, HGBUR, BILIRUBINUR, KETONESUR, PROTEINUR, UROBILINOGEN, NITRITE, LEUKOCYTESUR in the last 72 hours.  Invalid input(s): APPERANCEUR    Imaging: DG Chest Port 1 View  Result Date: 05/05/2020 CLINICAL DATA:  Status post mitral valve replacement. EXAM: PORTABLE CHEST 1 VIEW COMPARISON:  May 04, 2020. FINDINGS: Stable cardiomediastinal silhouette. Interval placement of right-sided PICC line with distal tip in expected position of the SVC. No pneumothorax or pleural effusion is noted. Slightly increased bibasilar edema or atelectasis is noted. Bony thorax is unremarkable. IMPRESSION: Slightly increased bibasilar edema or atelectasis. Electronically Signed   By: Marijo Conception M.D.   On: 05/05/2020 08:19   [place] EKG SITE RITE  Result Date: 05/04/2020 If Site Rite image not attached, placement could not be confirmed due to current cardiac rhythm.    Medications:   . sodium chloride 250 mL (05/04/20 1954)  . DOPamine 1 mcg/kg/min (05/06/20 0809)  . lactated ringers    . milrinone 0.3 mcg/kg/min (05/06/20 0700)   . sodium chloride   Intravenous Once  . bisacodyl  10 mg Oral Daily    Or  . bisacodyl  10 mg Rectal Daily  . Chlorhexidine Gluconate Cloth  6 each Topical Daily  . docusate sodium  200 mg Oral Daily  . ferrous [id number] C-folic acid  1 capsule Oral Q breakfast  . furosemide  80 mg Intravenous Once  . insulin aspart  0-24 Units Subcutaneous TID WC  . mouth rinse  15 mL Mouth Rinse  BID  . pantoprazole  40 mg Oral Daily  . polyethylene glycol  17 g Oral Daily  . sodium chloride flush  3 mL Intravenous Q12H  . torsemide  40 mg Oral BID  . warfarin  4 mg Oral q1600  . Warfarin - Physician Dosing Inpatient   Does not apply q1600   calcium carbonate, fentaNYL (SUBLIMAZE) injection, hydrALAZINE, ondansetron (ZOFRAN) IV, sodium chloride flush, traMADol  Assessment/ Plan:  1. Non-Oliguric AKI, severe, improving: Likely secondary to ATN from hypoperfusion during surgery.  No signs of obvious obstruction.  Creatinine  slight increase noted without any obvious signs of nephrotoxin administration.  No hemodynamic instability  -Continue to monitor daily Cr, Dose meds for GFR -Monitor Daily I/Os, Daily weight  -Maintain MAP>65 for optimal renal perfusion.  -Avoid nephrotoxic medications including NSAIDs and Vanc/Zosyn combo  2. Volume overload/heart failure exacerbation: Volume overload related to surgery, decreased renal function, heart failure.  Volume status improving with excellent urine output  -Transition to oral torsemide with excellent urine output -Hypokalemia appears resolved -Continue milrinone and dopamine per primary  3. Mitral valve stenosis: s/p repair. Surgery managing.  Anticoagulation with Coumadin  4. Anemia: Likely multifactorial with acute illness contributing. Management per primary and transfuse prn  5. Hypokalemia:  Will discontinue spironolactone at this point.  Continue to follow.  Appears to have resolved   LOS: [place] @TODAY @8 :10 AM

## 2020-05-06 NOTE — Evaluation (Signed)
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 She underwent mitral valve replacement and maze procedure on 04/29/2020.    Clinical Impression  Pt admitted with above diagnosis. PTA pt independent, living at home with husband. On eval, she required min assist sit to stand and min assist ambulation 54' with RW. VSS on 2L.  Pt currently with functional limitations due to the deficits listed below (see PT Problem List). Pt will benefit from skilled PT to increase their independence and safety with mobility to allow discharge to the venue listed below.       Follow Up Recommendations Home health PT;Supervision/Assistance - 24 hour    Equipment Recommendations  Rolling walker with 5" wheels    Recommendations for Other Services       Precautions / Restrictions Precautions Precautions: Fall;Other (comment) Precaution Comments: watch vitals      Mobility  Bed Mobility Overal bed mobility: Needs Assistance             General bed mobility comments: Pt received in recliner and returned to recliner.    Transfers Overall transfer level: Needs assistance Equipment used: Rolling walker (2 wheeled) Transfers: Sit to/from Stand Sit to Stand: +2 safety/equipment;Min assist         General transfer comment: cues for sternal precautions/hand placement, assist to power up  Ambulation/Gait Ambulation/Gait assistance: +2 safety/equipment;Min assist Gait Distance (Feet): 80 Feet Assistive device: Rolling walker (2 wheeled) Gait Pattern/deviations: Step-through pattern;Decreased stride length Gait velocity: decreased Gait velocity interpretation: <1.31 ft/sec, indicative of household ambulator General Gait Details: slow, guarded gait  Stairs             Wheelchair Mobility    Modified Rankin (Stroke Patients Only)       Balance Overall balance assessment: Needs assistance Sitting-balance support: No upper extremity supported;Feet supported Sitting balance-Leahy Scale: Good     Standing balance support: Bilateral upper extremity supported;No upper extremity supported;During functional activity Standing balance-Leahy Scale: Fair Standing balance comment: static stand without UE support, RW for amb                             Pertinent Vitals/Pain Pain Assessment: Faces Faces Pain Scale: Hurts a little bit Pain Location: sternal incision Pain Descriptors / Indicators: Discomfort Pain Intervention(s): Monitored during session;Repositioned    Home Living Family/patient expects to be discharged to:: Private residence Living Arrangements: Spouse/significant other Available Help at Discharge: Family;Available 24 hours/day Type of Home: House Home Access: Stairs to enter Entrance Stairs-Rails: Right Entrance Stairs-Number of Steps: 4 Home Layout: One level Home Equipment: None      Prior Function Level of Independence: Independent         Comments: limited by SOB/DOE     Hand Dominance   Dominant Hand: Right    Extremity/Trunk Assessment   Upper Extremity Assessment Upper Extremity Assessment: Overall WFL for tasks assessed (sternal precautions)    Lower Extremity Assessment Lower Extremity Assessment: Generalized weakness    Cervical / Trunk Assessment Cervical / Trunk Assessment: Normal  Communication   Communication: No difficulties  Cognition Arousal/Alertness: Awake/alert Behavior During Therapy: WFL for tasks assessed/performed Overall Cognitive Status: Within Functional Limits for tasks assessed  General Comments General comments (skin integrity, edema, etc.): VSS on 2L    Exercises     Assessment/Plan    PT  Assessment Patient needs continued PT services  PT Problem List Decreased mobility;Decreased activity tolerance;Cardiopulmonary status limiting activity;Decreased balance;Pain;Decreased knowledge of use of DME       PT Treatment Interventions DME instruction;Therapeutic activities;Gait training;Therapeutic exercise;Patient/family education;Balance training;Stair training;Functional mobility training    PT Goals (Current goals can be found in the Care Plan section)  Acute Rehab PT Goals Patient Stated Goal: home PT Goal Formulation: With patient/family Time For Goal Achievement: 05/20/20 Potential to Achieve Goals: Good    Frequency Min 3X/week   Barriers to discharge        Co-evaluation               AM-PAC PT "6 Clicks" Mobility  Outcome Measure Help needed turning from your back to your side while in a flat bed without using bedrails?: A Little Help needed moving from lying on your back to sitting on the side of a flat bed without using bedrails?: A Little Help needed moving to and from a bed to a chair (including a wheelchair)?: A Little Help needed standing up from a chair using your arms (e.g., wheelchair or bedside chair)?: A Little Help needed to walk in hospital room?: A Little Help needed climbing 3-5 steps with a railing? : A Lot 6 Click Score: 17    End of Session Equipment Utilized During Treatment: Gait belt;Oxygen Activity Tolerance: Patient tolerated treatment well Patient left: in chair;with call bell/phone within reach;with family/visitor present Nurse Communication: Mobility status PT Visit Diagnosis: Difficulty in walking, not elsewhere classified (R26.2);Muscle weakness (generalized) (M62.81)    Time: 3154-0086 PT Time Calculation (min) (ACUTE ONLY): 16 min   Charges:   PT Evaluation $PT Eval Moderate Complexity: 1 Mod          Lorrin Goodell, PT  Office # [phone] Pager [phone]   Lorriane Shire 05/06/2020, 12:22 PM

## 2020-05-06 NOTE — Plan of Care (Signed)

## 2020-05-06 NOTE — Progress Notes (Signed)
[facility] Evening Rounds  POD #7 s/p MVR Doing better Walked 8 laps C/o stomach pain, especially with eating PE: BP (!) 159/75   Pulse (!) 115   Temp 98.5 F (36.9 C) (Oral)   Resp 20   Ht 5\' 4"  (1.626 m)   Wt 114.9 kg   SpO2 97%   BMI 43.48 kg/m  Alert/oriented CTA Tachycardiac (just walked) Mild periph edema  Intake/Output Summary (Last 24 hours) at 05/06/2020 1907 Last data filed at 05/06/2020 1800 Gross per 24 hour  Intake 624.37 ml  Output 1425 ml  Net -800.63 ml   sCr 2.5  A/p: increase protonix to bid Consider kub Leslie [name] Z. Orvan Seen, [place]

## 2020-05-06 NOTE — Progress Notes (Addendum)
[address]       [place] [address]             [phone]      7 Days Post-Op Procedure(s) (LRB): MITRAL VALVE (MV) REPLACEMENT USING ON-X VALVE SIZE 27/29MM (N/A) MAZE (N/A) TRANSESOPHAGEAL ECHOCARDIOGRAM (TEE) (N/A) CLIPPING OF ATRIAL APPENDAGE USING ATRICURE  PRO2 CLIP SIZE 45MM Subjective: "feel loopy and lightheaded". Nauseated with poor appetite, did have a BM  Objective: Vital signs in last 24 hours: Temp:  [97.6 F (36.4 C)-98.6 F (37 C)] 98.5 F (36.9 C) (04/19 0645) Pulse Rate:  [99-124] 106 (04/19 0700) Cardiac Rhythm: Junctional rhythm (04/19 0400) Resp:  [14-29] 22 (04/19 0700) BP: (117-175)/(53-118) 138/118 (04/19 0700) SpO2:  [91 %-99 %] 99 % (04/19 0700) Weight:  [114.9 kg] 114.9 kg (04/19 0500)  Hemodynamic parameters for last 24 hours: CVP:  [8 mmHg-22 mmHg] 22 mmHg  Intake/Output from previous day: 04/18 0701 - 04/19 0700 In: 1403.7 [P.O.:1060; I.V.:343.7] Out: 2325 [Urine:2325] Intake/Output this shift: No intake/output data recorded.  General appearance: alert, cooperative and no distress Heart: regular rate and rhythm and crisp valve click Lungs: slightly coarse throughout, dim in left base>right Abdomen: soft, non-tender Extremities: no significant edema Wound: incis healing well  Lab Results: Recent Labs    05/05/20 0350 05/06/20 0441  WBC 11.0* 12.0*  HGB 7.5* 7.8*  HCT 23.0* 23.5*  PLT 223 325   BMET:  Recent Labs    05/05/20 1652 05/06/20 0441  NA 129* 132*  K 4.1 4.3  CL 90* 92*  CO2 27 28  GLUCOSE 180* 109*  BUN 58* 63*  CREATININE 2.18* 2.54*  CALCIUM 9.1 9.2    PT/INR:  Recent Labs    05/06/20 0441  LABPROT 17.3*  INR 1.4*   ABG    Component Value Date/Time   PHART 7.341 (L) 04/30/2020 2201   HCO3 22.9 04/30/2020 2201   TCO2 24 04/30/2020 2201   ACIDBASEDEF 3.0 (H) 04/30/2020 2201   O2SAT 58.2 05/06/2020 0430   CBG (last 3)  Recent Labs    05/05/20 1519 05/05/20 2020  05/06/20 0644  GLUCAP 143* 123* 118*    Meds Scheduled Meds: . sodium chloride   Intravenous Once  . bisacodyl  10 mg Oral Daily   Or  . bisacodyl  10 mg Rectal Daily  . Chlorhexidine Gluconate Cloth  6 each Topical Daily  . docusate sodium  200 mg Oral Daily  . ferrous [id number] C-folic acid  1 capsule Oral Q breakfast  . furosemide  80 mg Intravenous Once  . insulin aspart  0-24 Units Subcutaneous TID WC  . mouth rinse  15 mL Mouth Rinse BID  . pantoprazole  40 mg Oral Daily  . polyethylene glycol  17 g Oral Daily  . sodium chloride flush  3 mL Intravenous Q12H  . torsemide  40 mg Oral BID  . warfarin  2.5 mg Oral q1600  . Warfarin - Physician Dosing Inpatient   Does not apply q1600   Continuous Infusions: . sodium chloride 250 mL (05/04/20 1954)  . DOPamine 2 mcg/kg/min (05/06/20 0700)  . lactated ringers    . milrinone 0.3 mcg/kg/min (05/06/20 0700)   PRN Meds:.calcium carbonate, fentaNYL (SUBLIMAZE) injection, hydrALAZINE, ondansetron (ZOFRAN) IV, sodium chloride flush, traMADol  Xrays DG Chest Port 1 View  Result Date: 05/05/2020 CLINICAL DATA:  Status post mitral valve replacement. EXAM: PORTABLE CHEST 1 VIEW COMPARISON:  May 04, 2020. FINDINGS: Stable cardiomediastinal silhouette.  Interval placement of right-sided PICC line with distal tip in expected position of the SVC. No pneumothorax or pleural effusion is noted. Slightly increased bibasilar edema or atelectasis is noted. Bony thorax is unremarkable. IMPRESSION: Slightly increased bibasilar edema or atelectasis. Electronically Signed   By: Marijo Conception M.D.   On: 05/05/2020 08:19   [place] EKG SITE RITE  Result Date: 05/04/2020 If Site Rite image not attached, placement could not be confirmed due to current cardiac rhythm.   Assessment/Plan: S/P Procedure(s) (LRB): MITRAL VALVE (MV) REPLACEMENT USING ON-X VALVE SIZE 27/29MM (N/A) MAZE (N/A) TRANSESOPHAGEAL ECHOCARDIOGRAM (TEE) (N/A) CLIPPING OF  ATRIAL APPENDAGE USING ATRICURE  PRO2 CLIP SIZE 45MM  1 afebrile, VSS w/ SBP 100's-170's range- mostly in 120-130's range, junct tachy in low 100's , some PVC's. CO-OX increased to 58. Low dose dop and milrinone 2 sats good on RA 3 hyponatremia trend improving 4 BUN/CREAT trending a little higher- lasix ordered 80 IV this am w/transfusion . On demadex po bid 5 leukocytosis- minor, slightly increased trend 6 H/H stable- symptomatic ,transfusion this am, on iron supplement 7 INR 1.4 - will need to increase coumadin dose- 4 mg ordered 8 nephrologist/AHF team managing medical management primarily 9 BS adeq control 10 no new CXR 11 push rehab /pulm hygiene as able        LOS: 7 days    John Giovanni PA-C Pager [phone] 05/06/2020   I have seen and examined the patient and agree with the assessment and plan as outlined.  Rexene Alberts, MD 05/06/2020 10:05 AM

## 2020-05-06 NOTE — Plan of Care (Signed)
  Problem: Education: Goal: Knowledge of General Education information will improve Description: Including pain rating scale, medication(s)/side effects and non-pharmacologic comfort measures Outcome: Progressing   Problem: Health Behavior/Discharge Planning: Goal: Ability to manage health-related needs will improve Outcome: Progressing   Problem: Clinical Measurements: Goal: Ability to maintain clinical measurements within normal limits will improve Outcome: Progressing Goal: Will remain free from infection Outcome: Progressing Goal: Diagnostic test results will improve Outcome: Progressing Goal: Respiratory complications will improve Outcome: Progressing Goal: Cardiovascular complication will be avoided Outcome: Progressing   Problem: Nutrition: Goal: Adequate nutrition will be maintained Outcome: Progressing   Problem: Coping: Goal: Level of anxiety will decrease Outcome: Progressing   Problem: Elimination: Goal: Will not experience complications related to bowel motility Outcome: Progressing Goal: Will not experience complications related to urinary retention Outcome: Progressing   Problem: Pain Managment: Goal: General experience of comfort will improve Outcome: Progressing   Problem: Safety: Goal: Ability to remain free from injury will improve Outcome: Progressing   Problem: Skin Integrity: Goal: Risk for impaired skin integrity will decrease Outcome: Progressing   Problem: Education: Goal: Will demonstrate proper wound care and an understanding of methods to prevent future damage Outcome: Progressing Goal: Knowledge of disease or condition will improve Outcome: Progressing Goal: Knowledge of the prescribed therapeutic regimen will improve Outcome: Progressing Goal: Individualized Educational Video(s) Outcome: Progressing   Problem: Activity: Goal: Risk for activity intolerance will decrease Outcome: Progressing   Problem: Cardiac: Goal: Will  achieve and/or maintain hemodynamic stability Outcome: Progressing   Problem: Clinical Measurements: Goal: Postoperative complications will be avoided or minimized Outcome: Progressing   Problem: Respiratory: Goal: Respiratory status will improve Outcome: Progressing   Problem: Skin Integrity: Goal: Wound healing without signs and symptoms of infection Outcome: Progressing Goal: Risk for impaired skin integrity will decrease Outcome: Progressing   Problem: Urinary Elimination: Goal: Ability to achieve and maintain adequate renal perfusion and functioning will improve Outcome: Progressing

## 2020-05-07 ENCOUNTER — Other Ambulatory Visit: Payer: Self-pay

## 2020-05-07 DIAGNOSIS — Z954 Presence of other heart-valve replacement: Secondary | ICD-10-CM | POA: Diagnosis not present

## 2020-05-07 DIAGNOSIS — N179 Acute kidney failure, unspecified: Secondary | ICD-10-CM | POA: Diagnosis not present

## 2020-05-07 DIAGNOSIS — J9601 Acute respiratory failure with hypoxia: Secondary | ICD-10-CM | POA: Diagnosis not present

## 2020-05-07 DIAGNOSIS — I342 Nonrheumatic mitral (valve) stenosis: Secondary | ICD-10-CM | POA: Diagnosis not present

## 2020-05-07 DIAGNOSIS — I050 Rheumatic mitral stenosis: Secondary | ICD-10-CM

## 2020-05-07 LAB — TYPE AND SCREEN
ABO/RH(D): A POS
Antibody Screen: NEGATIVE
Unit division: 0
Unit division: 0

## 2020-05-07 LAB — RENAL FUNCTION PANEL
Albumin: 2.9 g/dL — ABNORMAL LOW (ref 3.5–5.0)
Anion gap: 14 (ref 5–15)
BUN: 61 mg/dL — ABNORMAL HIGH (ref 6–20)
CO2: 27 mmol/L (ref 22–32)
Calcium: 8.8 mg/dL — ABNORMAL LOW (ref 8.9–10.3)
Chloride: 89 mmol/L — ABNORMAL LOW (ref 98–111)
Creatinine, Ser: 2.41 mg/dL — ABNORMAL HIGH (ref 0.44–1.00)
GFR, Estimated: 23 mL/min — ABNORMAL LOW (ref >60)
Glucose, Bld: 157 mg/dL — ABNORMAL HIGH (ref 70–99)
Phosphorus: 3.7 mg/dL (ref 2.5–4.6)
Potassium: 3.6 mmol/L (ref 3.5–5.1)
Sodium: 130 mmol/L — ABNORMAL LOW (ref 135–145)

## 2020-05-07 LAB — GLUCOSE, CAPILLARY
Glucose-Capillary: 120 mg/dL — ABNORMAL HIGH (ref 70–99)
Glucose-Capillary: 120 mg/dL — ABNORMAL HIGH (ref 70–99)
Glucose-Capillary: 131 mg/dL — ABNORMAL HIGH (ref 70–99)

## 2020-05-07 LAB — BPAM RBC
Blood Product Expiration Date: 202204212359
Blood Product Expiration Date: 202204232359
ISSUE DATE / TIME: 202204190618
ISSUE DATE / TIME: 202204191126
Unit Type and Rh: 600
Unit Type and Rh: 6200

## 2020-05-07 LAB — COOXEMETRY PANEL
Carboxyhemoglobin: 1.1 % (ref 0.5–1.5)
Carboxyhemoglobin: 1.4 % (ref 0.5–1.5)
Methemoglobin: 1 % (ref 0.0–1.5)
Methemoglobin: 1.2 % (ref 0.0–1.5)
O2 Saturation: 54.5 %
O2 Saturation: 60.8 %
Total hemoglobin: 12.4 g/dL (ref 12.0–16.0)
Total hemoglobin: 9.4 g/dL — ABNORMAL LOW (ref 12.0–16.0)

## 2020-05-07 LAB — CBC
HCT: 28.1 % — ABNORMAL LOW (ref 36.0–46.0)
Hemoglobin: 9.5 g/dL — ABNORMAL LOW (ref 12.0–15.0)
MCH: 29.5 pg (ref 26.0–34.0)
MCHC: 33.8 g/dL (ref 30.0–36.0)
MCV: 87.3 fL (ref 80.0–100.0)
Platelets: 406 10*3/uL — ABNORMAL HIGH (ref 150–400)
RBC: 3.22 MIL/uL — ABNORMAL LOW (ref 3.87–5.11)
RDW: 14.7 % (ref 11.5–15.5)
WBC: 13.6 10*3/uL — ABNORMAL HIGH (ref 4.0–10.5)
nRBC: 0.6 % — ABNORMAL HIGH (ref 0.0–0.2)

## 2020-05-07 LAB — PROTIME-INR
INR: 1.5 — ABNORMAL HIGH (ref 0.8–1.2)
Prothrombin Time: 17.8 seconds — ABNORMAL HIGH (ref 11.4–15.2)

## 2020-05-07 MED ORDER — POTASSIUM CHLORIDE CRYS ER 20 MEQ PO TBCR
40.0000 meq | EXTENDED_RELEASE_TABLET | Freq: Three times a day (TID) | ORAL | Status: AC
  Administered 2020-05-07 (×3): 40 meq via ORAL
  Filled 2020-05-07 (×3): qty 2

## 2020-05-07 MED ORDER — METOPROLOL TARTRATE 12.5 MG HALF TABLET: 12.5000 mg | ORAL_TABLET | Freq: Two times a day (BID) | ORAL | Status: DC

## 2020-05-07 MED ORDER — ATORVASTATIN CALCIUM 80 MG PO TABS
80.0000 mg | ORAL_TABLET | Freq: Every day | ORAL | Status: DC
  Administered 2020-05-07 – 2020-05-11 (×5): 80 mg via ORAL
  Filled 2020-05-07 (×5): qty 1

## 2020-05-07 MED ORDER — METOPROLOL TARTRATE 25 MG PO TABS
25.0000 mg | ORAL_TABLET | Freq: Two times a day (BID) | ORAL | Status: DC
  Administered 2020-05-07 (×2): 25 mg via ORAL
  Filled 2020-05-07 (×2): qty 1

## 2020-05-07 MED ORDER — WARFARIN SODIUM 5 MG PO TABS
5.0000 mg | ORAL_TABLET | Freq: Every day | ORAL | Status: DC
  Administered 2020-05-07 – 2020-05-10 (×4): 5 mg via ORAL
  Filled 2020-05-07 (×4): qty 1

## 2020-05-07 NOTE — Progress Notes (Addendum)
[facility] DAILY ICU PROGRESS NOTE                   [address]            [place] [address]          [phone]   8 Days Post-Op Procedure(s) (LRB): MITRAL VALVE (MV) REPLACEMENT USING ON-X VALVE SIZE 27/29MM (N/A) MAZE (N/A) TRANSESOPHAGEAL ECHOCARDIOGRAM (TEE) (N/A) CLIPPING OF ATRIAL APPENDAGE USING ATRICURE  PRO2 CLIP SIZE 45MM  Total Length of Stay:  LOS: 8 days   Subjective: Feels better, not lightheaded/dizzy following transfusion  Objective: Vital signs in last 24 hours: Temp:  [97.3 F (36.3 C)-98.6 F (37 C)] 98.6 F (37 C) (04/19 2000) Pulse Rate:  [104-122] 117 (04/20 0600) Cardiac Rhythm: Atrial flutter;Normal sinus rhythm (04/20 0400) Resp:  [16-31] 18 (04/20 0600) BP: (118-199)/(50-126) 122/55 (04/20 0600) SpO2:  [80 %-99 %] 93 % (04/20 0600) Weight:  [114.6 kg] 114.6 kg (04/20 0600)  Filed Weights   05/05/20 0500 05/06/20 0500 05/07/20 0600  Weight: 115.6 kg 114.9 kg 114.6 kg    Weight change: -0.3 kg   Hemodynamic parameters for last 24 hours: CVP:  [3 mmHg-19 mmHg] 16 mmHg  Intake/Output from previous day: 04/19 0701 - 04/20 0700 In: 571.7 [P.O.:340; I.V.:231.7] Out: 2025 [Urine:2025]  Intake/Output this shift: No intake/output data recorded.  Current Meds: Scheduled Meds: . sodium chloride   Intravenous Once  . bisacodyl  10 mg Oral Daily   Or  . bisacodyl  10 mg Rectal Daily  . Chlorhexidine Gluconate Cloth  6 each Topical Daily  . docusate sodium  200 mg Oral Daily  . enoxaparin (LOVENOX) injection  30 mg Subcutaneous Q24H  . ferrous [id number] C-folic acid  1 capsule Oral Q breakfast  . insulin aspart  0-24 Units Subcutaneous TID WC  . mouth rinse  15 mL Mouth Rinse BID  . pantoprazole  40 mg Oral BID AC  . polyethylene glycol  17 g Oral Daily  . sodium chloride flush  3 mL Intravenous Q12H  . torsemide  40 mg Oral BID  . warfarin  4 mg Oral q1600  . Warfarin - Physician Dosing Inpatient   Does not  apply q1600   Continuous Infusions: . sodium chloride 250 mL (05/06/20 1140)  . lactated ringers    . milrinone 0.3 mcg/kg/min (05/07/20 0600)   PRN Meds:.calcium carbonate, fentaNYL (SUBLIMAZE) injection, hydrALAZINE, ondansetron (ZOFRAN) IV, sodium chloride flush, traMADol  General appearance: alert, cooperative and no distress Heart: regular rate and rhythm and tachy Lungs: mildly dim in bases Abdomen: obese, non tender Extremities: minor edema Wound: incis healing well  Lab Results: CBC: Recent Labs    05/06/20 0441 05/07/20 0439  WBC 12.0* 13.6*  HGB 7.8* 9.5*  HCT 23.5* 28.1*  PLT 325 406*   BMET:  Recent Labs    05/06/20 0441 05/07/20 0439  NA 132* 130*  K 4.3 3.6  CL 92* 89*  CO2 28 27  GLUCOSE 109* 157*  BUN 63* 61*  CREATININE 2.54* 2.41*  CALCIUM 9.2 8.8*    CMET: Lab Results  Component Value Date   WBC 13.6 (H) 05/07/2020   HGB 9.5 (L) 05/07/2020   HCT 28.1 (L) 05/07/2020   PLT 406 (H) 05/07/2020   GLUCOSE 157 (H) 05/07/2020   CHOL 204 (H) 03/07/2020   TRIG 224 (H) 03/07/2020   HDL 35 (L) 03/07/2020   LDLCALC 129 (H) 03/07/2020   ALT 33 05/05/2020  AST 36 05/05/2020   NA 130 (L) 05/07/2020   K 3.6 05/07/2020   CL 89 (L) 05/07/2020   CREATININE 2.41 (H) 05/07/2020   BUN 61 (H) 05/07/2020   CO2 27 05/07/2020   TSH 0.250 (L) 03/07/2020   INR 1.5 (H) 05/07/2020   HGBA1C 6.5 (H) 04/25/2020      PT/INR:  Recent Labs    05/07/20 0439  LABPROT 17.8*  INR 1.5*   Radiology: DG Abd 1 View  Result Date: 05/06/2020 CLINICAL DATA:  Abdomen distension EXAM: ABDOMEN - 1 VIEW COMPARISON:  03/24/2020 FINDINGS: Limited by habitus. Nonobstructed gas pattern. Faintly visible calcification in the region of right kidney. IMPRESSION: Nonobstructed gas pattern. Electronically Signed   By: Donavan Foil M.D.   On: 05/06/2020 21:04     Assessment/Plan: S/P Procedure(s) (LRB): MITRAL VALVE (MV) REPLACEMENT USING ON-X VALVE SIZE 27/29MM (N/A) MAZE  (N/A) TRANSESOPHAGEAL ECHOCARDIOGRAM (TEE) (N/A) CLIPPING OF ATRIAL APPENDAGE USING ATRICURE  PRO2 CLIP SIZE 45MM   1 afeb, VSS, some hypertensive readings but mostly well controlled, on low dos milrinone. Some afib/junctional tachycardia- monitor, may need beta blocker/anti-dysrhythmic 2 creat trend improved today with BUN/Creat 61/2.41 3 H/H improved after transfusion 4 hyponatremia is fairly stable 5 BS adeq controlled 6 INR 1.5- will increase coumadin dose further 7 AXR non-obstructive gas pattern 8 leukocytosis trend a little increased with WBC 12>>>13.6 - cont to monitor clinically 9 AHF and nepprology cont to assist with medical management of AKI/CHF 10 cont PT, will need HH- lives with husband    John Giovanni PA-C Pager [phone] 05/07/2020 7:46 AM   I have seen and examined the patient and agree with the assessment and plan as outlined.  Restart low dose metoprolol.  Wean milrinone slowly.  Mobilize.  Transfer 4E PCU  Rexene Alberts, MD 05/07/2020 8:25 AM

## 2020-05-07 NOTE — Progress Notes (Signed)
[facility] ROUNDING NOTE   Subjective:   Brief history: This is a 55 year old lady with past medical history of mitral stenosis chronic kidney disease with a baseline serum creatinine 1.1 to 1.3 mg/dL.  She underwent mitral valve replacement with maze and left atrial appendage clipping on 04/29/2020.  Hospital course complicated by severe nonoliguric acute kidney injury secondary to acute tubular necrosis.  Urinalysis bland with trace RBCs noted on high-powered field.  Renal ultrasound without hydronephrosis did have some mild symmetric bladder wall thickening.  Blood pressure 122/54 pulse 116 temperature 98.5 O2 sats 98% 2 L nasal cannula.  Urine output 1.7 L 05/06/2020  IV dopamine IV milrinone  Sodium 132 potassium 4.3 chloride 92 CO2 28 BUN 60 creatinine 2.54 glucose 109 calcium 9.2 albumin 2.7 hemoglobin 7.8       Objective:  Vital signs in last 24 hours:  Temp:  [97.3 F (36.3 C)-98.6 F (37 C)] 98.6 F (37 C) (04/19 2000) Pulse Rate:  [104-122] 117 (04/20 0600) Resp:  [16-31] 21 (04/20 0800) BP: (118-199)/(50-109) 122/55 (04/20 0600) SpO2:  [80 %-99 %] 93 % (04/20 0600) Weight:  [114.6 kg] 114.6 kg (04/20 0600)  Weight change: -0.3 kg Filed Weights   05/05/20 0500 05/06/20 0500 05/07/20 0600  Weight: 115.6 kg 114.9 kg 114.6 kg    Intake/Output: I/O last 3 completed shifts: In: 1078.1 [P.O.:680; I.V.:398.1] Out: 3664 [Urine:2550]   Intake/Output this shift:  No intake/output data recorded.  Constitutional: sitting in chair, no distress ENMT: ears and nose without scars or lesions, MMM CV: normal rate, trace bilateral LEE Respiratory: Bilateral chest rise with no increased work of breathing Gastrointestinal: soft, non-tender, no palpable masses or hernias Skin: no visible lesions or rashes Psych: alert, judgement/insight appropriate, appropriate mood and affect   Basic Metabolic Panel: Recent Labs  Lab 04/30/20 1654 04/30/20 2026  05/04/20 0500 05/04/20 1521 05/05/20 0350 05/05/20 1652 05/06/20 0441 05/07/20 0439  NA 134*   < > 132*  131* 124* 127* 129* 132* 130*  K 3.9   < > 2.7*  2.7* 2.7* 4.0 4.1 4.3 3.6  CL 104   < > 88*  87* 86* 89* 90* 92* 89*  CO2 22   < > 30  30 29 28 27 28 27   GLUCOSE 193*   < > 120*  119* 270* 200* 180* 109* 157*  BUN 26*   < > 62*  64* 58* 59* 58* 63* 61*  CREATININE 2.66*   < > 2.77*  2.68* 2.10* 2.40* 2.18* 2.54* 2.41*  CALCIUM 8.1*   < > 8.8*  8.7* 8.0* 8.6* 9.1 9.2 8.8*  MG 2.9*  --  2.6*  --   --   --   --   --   PHOS  --   --  4.8*  --  2.9  --  3.3 3.7   < > = values in this interval not displayed.    Liver Function Tests: Recent Labs  Lab 05/02/20 0409 05/03/20 0349 05/04/20 0500 05/05/20 0350 05/06/20 0441 05/07/20 0439  AST 116* 73* 50* 36  --   --   ALT 54* 49* 44 33  --   --   ALKPHOS 83 101 93 84  --   --   BILITOT 0.7 0.6 0.7 0.8  --   --   PROT 6.4* 6.4* 6.7 6.3*  --   --   ALBUMIN 3.0* 2.8* 2.9*  2.8* 2.6* 2.7* 2.9*   No results for input(s): LIPASE,  AMYLASE in the last 168 hours. No results for input(s): AMMONIA in the last 168 hours.  CBC: Recent Labs  Lab 05/03/20 0349 05/04/20 0500 05/05/20 0350 05/06/20 0441 05/07/20 0439  WBC 11.2* 10.0 11.0* 12.0* 13.6*  HGB 7.7* 8.2* 7.5* 7.8* 9.5*  HCT 23.0* 24.6* 23.0* 23.5* 28.1*  MCV 87.8 86.9 87.8 88.7 87.3  PLT 150 172 223 325 406*    Cardiac Enzymes: No results for input(s): CKTOTAL, CKMB, CKMBINDEX, TROPONINI in the last 168 hours.  BNP: Invalid input(s): POCBNP  CBG: Recent Labs  Lab 05/05/20 2020 05/06/20 0644 05/06/20 1119 05/06/20 1126 05/06/20 1601  GLUCAP 123* 118* 127* 101* 114*    Microbiology: Results for orders placed or performed during the hospital encounter of 04/25/20  Surgical pcr screen     Status: None   Collection Time: 04/25/20 10:06 AM   Specimen: Nasal Mucosa; Nasal Swab  Result Value Ref Range Status   MRSA, PCR NEGATIVE NEGATIVE Final    Staphylococcus aureus NEGATIVE NEGATIVE Final    Comment: (NOTE) The Xpert SA Assay (FDA approved for NASAL specimens in patients 48 years of age and older), is one component of a comprehensive surveillance program. It is not intended to diagnose infection nor to guide or monitor treatment. Performed at [facility], [place] [address], [place], [place] [address]     Coagulation Studies: Recent Labs    05/05/20 0350 05/06/20 0441 05/07/20 0439  LABPROT 17.9* 17.3* 17.8*  INR 1.5* 1.4* 1.5*    Urinalysis: No results for input(s): COLORURINE, LABSPEC, PHURINE, GLUCOSEU, HGBUR, BILIRUBINUR, KETONESUR, PROTEINUR, UROBILINOGEN, NITRITE, LEUKOCYTESUR in the last 72 hours.  Invalid input(s): APPERANCEUR    Imaging: DG Abd 1 View  Result Date: 05/06/2020 CLINICAL DATA:  Abdomen distension EXAM: ABDOMEN - 1 VIEW COMPARISON:  03/24/2020 FINDINGS: Limited by habitus. Nonobstructed gas pattern. Faintly visible calcification in the region of right kidney. IMPRESSION: Nonobstructed gas pattern. Electronically Signed   By: Donavan Foil M.D.   On: 05/06/2020 21:04     Medications:   . sodium chloride 250 mL (05/06/20 1140)  . lactated ringers    . milrinone 0.3 mcg/kg/min (05/07/20 0600)   . sodium chloride   Intravenous Once  . atorvastatin  80 mg Oral Daily  . bisacodyl  10 mg Oral Daily   Or  . bisacodyl  10 mg Rectal Daily  . Chlorhexidine Gluconate Cloth  6 each Topical Daily  . docusate sodium  200 mg Oral Daily  . enoxaparin (LOVENOX) injection  30 mg Subcutaneous Q24H  . ferrous [id number] C-folic acid  1 capsule Oral Q breakfast  . insulin aspart  0-24 Units Subcutaneous TID WC  . mouth rinse  15 mL Mouth Rinse BID  . metoprolol tartrate  25 mg Oral BID  . pantoprazole  40 mg Oral BID AC  . polyethylene glycol  17 g Oral Daily  . potassium chloride  40 mEq Oral TID WC  . sodium chloride flush  3 mL Intravenous Q12H  . torsemide  40 mg Oral BID  .  warfarin  5 mg Oral q1600  . Warfarin - Physician Dosing Inpatient   Does not apply q1600   calcium carbonate, fentaNYL (SUBLIMAZE) injection, hydrALAZINE, ondansetron (ZOFRAN) IV, sodium chloride flush, traMADol  Assessment/ Plan:  1. Non-Oliguric AKI, severe, improving: Likely secondary to ATN from hypoperfusion during surgery.  No signs of obvious obstruction.  Creatinine  slight increase noted without any obvious signs of nephrotoxin administration.  No hemodynamic instability  -  Continue to monitor daily Cr, Dose meds for GFR -Monitor Daily I/Os, Daily weight  -Maintain MAP>65 for optimal renal perfusion.  -Avoid nephrotoxic medications including NSAIDs and Vanc/Zosyn combo  2. Volume overload/heart failure exacerbation: Volume overload related to surgery, decreased renal function, heart failure.  Volume status improving with excellent urine output  -Transition to oral torsemide with excellent urine output -Hypokalemia appears resolved -Continue milrinone and dopamine per primary  3. Mitral valve stenosis: s/p repair. Surgery managing.  Anticoagulation with Coumadin  4. Anemia: Likely multifactorial with acute illness contributing. Management per primary and transfuse prn  5. Hypokalemia:  Will discontinue spironolactone at this point.  Continue to follow.  Appears to have resolved   LOS: [place] @TODAY @9 :00 AM

## 2020-05-07 NOTE — Progress Notes (Signed)
Patient received into room 4E17. Vital signs stable and patient placed on telemetry. Central monitoring notified, skin assessment and CHG complete. Will continue to monitor.   -Donnelly Angelica, RN

## 2020-05-07 NOTE — Progress Notes (Signed)
[id number] Came to see pt to walk. Pt stated she just got pain med in anticipation of walking later. Has walked twice already. Gave pt IS and she demonstrated 500-750 ml x 5. Will let Mobility specialist walk pt later and we will continue to follow. Graylon Good RN BSN 05/07/2020 1:25 PM

## 2020-05-07 NOTE — Progress Notes (Signed)
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 She underwent mitral valve replacement and maze procedure on 04/29/2020.    PT Comments    Pt reporting moderate incisional chest pain, but agreeable to mobility progression. Pt ambulatory in hallway with use of RW and close guard for safety, PT cuing pt for standing rest breaks as needed given dyspnea and HR rising. Pt still requiring min assist for bed mobility tasks at this time, and cues for maintaining sternal precautions. Pt's husband in room during session, remains supportive of pt. PT to continue to follow acutely.     Follow Up Recommendations  Home health PT;Supervision/Assistance - 24 hour     Equipment Recommendations  Rolling walker with 5" wheels    Recommendations for Other Services       Precautions / Restrictions Precautions Precautions: Fall;Sternal Precaution Comments: reviewed "move in the tube", correct way to get into and out of bed Restrictions Weight Bearing Restrictions: No    Mobility  Bed Mobility Overal bed mobility: Needs Assistance Bed Mobility: Rolling;Sidelying to Sit Rolling: Min guard Sidelying to sit: Min assist       General bed mobility comments: min assist for completion of truncal translation in roll to sitting. Cues for sequencing task and maintaining sternal precautions    Transfers Overall transfer level: Needs assistance Equipment used: Rolling walker (2 wheeled) Transfers: Sit to/from Stand Sit to Stand: Min guard         General transfer comment: min guard for safety, cues for hand placement on knees to avoid stressing sternal incision  Ambulation/Gait Ambulation/Gait assistance: Min guard Gait Distance (Feet): 90 Feet Assistive  device: Rolling walker (2 wheeled) Gait Pattern/deviations: Step-through pattern;Decreased stride length;Trunk flexed Gait velocity: decr   General Gait Details: min guard for safety, VC for upright posture, standing rest breaks as needed. Standing rest breaks x4 during session, HRmax 116 bpm   Stairs             Wheelchair Mobility    Modified Rankin (Stroke Patients Only)       Balance Overall balance assessment: Needs assistance Sitting-balance support: No upper extremity supported;Feet supported Sitting balance-Leahy Scale: Good     Standing balance support: Bilateral upper extremity supported;No upper extremity supported;During functional activity Standing balance-Leahy Scale: Fair Standing balance comment: RW for amb                            Cognition Arousal/Alertness: Awake/alert Behavior During Therapy: WFL for tasks assessed/performed Overall Cognitive Status: Within Functional Limits for tasks assessed                                        Exercises      General Comments        Pertinent Vitals/Pain Pain Assessment: 0-10 Pain Score: 6  Pain Location: sternal incision Pain Descriptors / Indicators: Discomfort Pain Intervention(s): Limited activity within patient's tolerance;Monitored during session;Repositioned    Home Living                      Prior Function  PT Goals (current goals can now be found in the care plan section) Acute Rehab PT Goals Patient Stated Goal: home PT Goal Formulation: With patient/family Time For Goal Achievement: 05/20/20 Potential to Achieve Goals: Good Progress towards PT goals: Progressing toward goals    Frequency    Min 3X/week      PT Plan Current plan remains appropriate    Co-evaluation              AM-PAC PT "6 Clicks" Mobility   Outcome Measure  Help needed turning from your back to your side while in a flat bed without using  bedrails?: A Little Help needed moving from lying on your back to sitting on the side of a flat bed without using bedrails?: A Little Help needed moving to and from a bed to a chair (including a wheelchair)?: A Little Help needed standing up from a chair using your arms (e.g., wheelchair or bedside chair)?: A Little Help needed to walk in hospital room?: A Little Help needed climbing 3-5 steps with a railing? : A Lot 6 Click Score: 17    End of Session   Activity Tolerance: Patient tolerated treatment well Patient left: with call bell/phone within reach;with family/visitor present;in bed Nurse Communication: Mobility status PT Visit Diagnosis: Difficulty in walking, not elsewhere classified (R26.2);Muscle weakness (generalized) (M62.81)     Time: 1751-0258 PT Time Calculation (min) (ACUTE ONLY): 22 min  Charges:  $Gait Training: 8-22 mins                    Stacie Glaze, PT DPT Acute Rehabilitation Services Pager [phone]  Office [phone]    [place] 05/07/2020, 2:45 PM

## 2020-05-07 NOTE — Progress Notes (Addendum)
Advanced Heart Failure Rounding Note  PCP-Cardiologist: Ida Rogue, MD   Subjective:    Remains on milrionone 0.3. Now off Dopamine. Co-ox not drawn yet.    -2.L in UOP yesterday w/ PO diuretics. Wt down 1 lb. Still ~7 lb above pre-op wt but overall feeling better. Breathing better. Off supp O2. CVP 11-13  Scr down slightly, 2.54>>2.41     Na 130   Transfused 2 units RBCs yesterday, Hgb improved 7.8>>9.5.  INR 1.5.   WBC drifting up, 11>>12>>14K. AF. Sinus tach on tele.   Echo 4/15 EF 60-65% MVR ok    Objective:   Weight Range: 114.6 kg Body mass index is 43.37 kg/m.   Vital Signs:   Temp:  [97.3 F (36.3 C)-98.6 F (37 C)] 98.6 F (37 C) (04/19 2000) Pulse Rate:  [104-122] 117 (04/20 0600) Resp:  [16-31] 18 (04/20 0600) BP: (118-199)/(50-126) 122/55 (04/20 0600) SpO2:  [80 %-99 %] 93 % (04/20 0600) Weight:  [114.6 kg] 114.6 kg (04/20 0600) Last BM Date: 05/06/20  Weight change: Filed Weights   05/05/20 0500 05/06/20 0500 05/07/20 0600  Weight: 115.6 kg 114.9 kg 114.6 kg    Intake/Output:   Intake/Output Summary (Last 24 hours) at 05/07/2020 0825 Last data filed at 05/07/2020 0600 Gross per 24 hour  Intake 558.55 ml  Output 2025 ml  Net -1466.45 ml      Physical Exam   CVP 11-13  General:  Well appearing, sitting up in chair. No respiratory difficulty HEENT: normal Neck: supple. Thick neck, JVD not well visualized. Carotids 2+ bilat; no bruits. No lymphadenopathy or thyromegaly appreciated. Cor: PMI nondisplaced. Regular rhythm, mildly tachy rate. Crisp mechanical valve sounds Lungs: clear bilaterally  Abdomen: obese, soft, nontender, nondistended. No hepatosplenomegaly. No bruits or masses. Good bowel sounds. Extremities: no cyanosis, clubbing, rash, trace-1+ pretibial edema   Neuro: alert & oriented x 3, cranial nerves grossly intact. moves all 4 extremities w/o difficulty. Affect pleasant.   Telemetry   Sinus tach low 100s Personally  reviewed  Labs    CBC Recent Labs    05/06/20 0441 05/07/20 0439  WBC 12.0* 13.6*  HGB 7.8* 9.5*  HCT 23.5* 28.1*  MCV 88.7 87.3  PLT 325 761*   Basic Metabolic Panel Recent Labs    05/06/20 0441 05/07/20 0439  NA 132* 130*  K 4.3 3.6  CL 92* 89*  CO2 28 27  GLUCOSE 109* 157*  BUN 63* 61*  CREATININE 2.54* 2.41*  CALCIUM 9.2 8.8*  PHOS 3.3 3.7   Liver Function Tests Recent Labs    05/05/20 0350 05/06/20 0441 05/07/20 0439  AST 36  --   --   ALT 33  --   --   ALKPHOS 84  --   --   BILITOT 0.8  --   --   PROT 6.3*  --   --   ALBUMIN 2.6* 2.7* 2.9*   No results for input(s): LIPASE, AMYLASE in the last 72 hours. Cardiac Enzymes No results for input(s): CKTOTAL, CKMB, CKMBINDEX, TROPONINI in the last 72 hours.  BNP: BNP (last 3 results) No results for input(s): BNP in the last 8760 hours.  ProBNP (last 3 results) No results for input(s): PROBNP in the last 8760 hours.   D-Dimer No results for input(s): DDIMER in the last 72 hours. Hemoglobin A1C No results for input(s): HGBA1C in the last 72 hours. Fasting Lipid Panel No results for input(s): CHOL, HDL, LDLCALC, TRIG, CHOLHDL, LDLDIRECT in the last  72 hours. Thyroid Function Tests No results for input(s): TSH, T4TOTAL, T3FREE, THYROIDAB in the last 72 hours.  Invalid input(s): FREET3  Other results:   Imaging    DG Abd 1 View  Result Date: 05/06/2020 CLINICAL DATA:  Abdomen distension EXAM: ABDOMEN - 1 VIEW COMPARISON:  03/24/2020 FINDINGS: Limited by habitus. Nonobstructed gas pattern. Faintly visible calcification in the region of right kidney. IMPRESSION: Nonobstructed gas pattern. Electronically Signed   By: Donavan Foil M.D.   On: 05/06/2020 21:04     Medications:     Scheduled Medications: . sodium chloride   Intravenous Once  . bisacodyl  10 mg Oral Daily   Or  . bisacodyl  10 mg Rectal Daily  . Chlorhexidine Gluconate Cloth  6 each Topical Daily  . docusate sodium  200 mg  Oral Daily  . enoxaparin (LOVENOX) injection  30 mg Subcutaneous Q24H  . ferrous [id number] C-folic acid  1 capsule Oral Q breakfast  . insulin aspart  0-24 Units Subcutaneous TID WC  . mouth rinse  15 mL Mouth Rinse BID  . pantoprazole  40 mg Oral BID AC  . polyethylene glycol  17 g Oral Daily  . sodium chloride flush  3 mL Intravenous Q12H  . torsemide  40 mg Oral BID  . warfarin  5 mg Oral q1600  . Warfarin - Physician Dosing Inpatient   Does not apply q1600    Infusions: . sodium chloride 250 mL (05/06/20 1140)  . lactated ringers    . milrinone 0.3 mcg/kg/min (05/07/20 0600)    PRN Medications: calcium carbonate, fentaNYL (SUBLIMAZE) injection, hydrALAZINE, ondansetron (ZOFRAN) IV, sodium chloride flush, traMADol    Patient Profile   55 y/o woman with morbid obesity, HTN, PAF, OSA and rheumtic heart disease with severe MS. Underwent mechanical MVR and maze on 4/12. Post op course c/b pulmonary edema requiring BIPAP + nonoliguric AKI.   Assessment/Plan   1. Severe Mitral Valve Stenosis - 2/2 rheumatic heart disease  - s/p mechanical MVR 4/12 - on warfarin/ASA, INR 1.5.   2. Acute CHF/Pulmonary Edema>>Cardiogenic Shock  - perioperative TEE EF 60-65% with severe mitral stenosis. - developed acute pulmonary edema post op + AKI - initial Co-ox 43%>>started on milrinone + Dopamine  - Echo 4/15 EF 60-65% MVR ok  - Now off Dopamine, remains on Milrinone 0.3. Wean milrinone to 0.2 and follow co-ox  - Still mildly fluid overloaded, but respiratory status improved. Nephrology managing diuretics. Continue PO torsemide 40 mg bid  3. AKI  - 1.0>1.4>1.8>2.7>3.4>>3.8>3.4>2.8>2.4>2.5>2.4   - nephrology following  - suspect post-op ATN - Improving. Continue to follow  4. Acute Hypoxemic Respiratory Failure - due to pulmonary edema - much improved with  IV diuresis - O2 sats now stable on RA   5. PAF - s/p MAZE at time of MVR  - in ST on tele, HR should improve w/  milrinone wean   - INR 1.5 plan lifelong warfarin given mechanical MV   6. Hypokalemia - resolved, 3.6 - Continue spiro  7. Hyponatremia - 2/2 hypervolemia - Na 130  - continue diuretics   8. Anemia - Transfused 2 units RBCs yesterday  - Hgb 7.8>>9.5    Length of Stay: [address], PA-C  05/07/2020, 8:25 AM  Advanced Heart Failure Team Pager [phone] (M-F; 7a - 5p)  Please contact [facility] for night-coverage after hours (5p -7a ) and weekends on [email or web address]   Patient seen and examined with the above-signed Advanced Practice Provider  and/or Housestaff. I personally reviewed laboratory data, imaging studies and relevant notes. I independently examined the patient and formulated the important aspects of the plan. I have edited the note to reflect any of my changes or salient points. I have personally discussed the plan with the patient and/or family.  Remains on milrinone. DA off. Continues to diurese slowly on po torsemide. Co-ox improved but still marginal,  General:  Sitting in chair No resp difficulty HEENT: normal Neck: supple. JVP to jaw . Carotids 2+ bilat; no bruits. No lymphadenopathy or thryomegaly appreciated. Cor: PMI nondisplaced. Regular rate & rhythm.mechanical s1 Lungs: clear Abdomen: obese soft, nontender, nondistended. No hepatosplenomegaly. No bruits or masses. Good bowel sounds. Extremities: no cyanosis, clubbing, rash, tr-1+ edema Neuro: alert & orientedx3, cranial nerves grossly intact. moves all 4 extremities w/o difficulty. Affect pleasant  Volume status improving slowly. Co-ox marginal but stable. Can wean milrinone to 0.2. Continue torsemide per Renal. Follow CVP, co-ox and renal function closely. Ambulate.   INR 1.5 management per TCTS.   Glori Bickers, MD  12:30 PM

## 2020-05-07 NOTE — TOC Initial Note (Signed)
Transition of Care (TOC) - Initial/Assessment Note  Heart Failure   Patient Details  Name: Leslie [name] MRN: [id number] Date of Birth: [birth year]  Transition of Care [facility]) CM/SW Contact:    [place], [place] Phone Number: 05/07/2020, 4:23 PM  Clinical Narrative:         CSW received consult for Home Health PT at time of discharge. CSW spoke with patient and her spouse. Patient expressed understanding of PT recommendation and is agreeable to home health PT at time of discharge. Patient reports preference for whoever will work with her health insurance. CSW provided Medicare home health ratings list. Patient expressed being hopeful for rehab and to feel better soon. Patient reports needing a walker. No further questions reported at this time. CSW reaching out to [facility] agencies to see who will work with patients health insurance.  TOC will continue to follow for d/c needs.           Expected Discharge Plan: [place] Barriers to Discharge: Continued Medical Work up   Patient Goals and CMS Choice Patient states their goals for this hospitalization and ongoing recovery are:: to return home CMS Medicare.gov Compare Post Acute Care list provided to:: Patient Choice offered to / list presented to : [facility]  Expected Discharge Plan and Services Expected Discharge Plan: [place] In-house Referral: Clinical Social Work Discharge Planning Services: CM Consult   Living arrangements for the past 2 months: Apartment                                      Prior Living Arrangements/Services Living arrangements for the past 2 months: Apartment Lives with:: Spouse                   Activities of Daily Living Home Assistive Devices/Equipment: Eyeglasses,Blood pressure cuff,Grab bars in shower,Built-in shower seat ADL Screening (condition at time of admission) Patient's cognitive ability adequate to safely complete daily activities?:  Yes Is the patient deaf or have difficulty hearing?: No Does the patient have difficulty seeing, even when wearing glasses/contacts?: No Does the patient have difficulty concentrating, remembering, or making decisions?: No Patient able to express need for assistance with ADLs?: Yes Does the patient have difficulty dressing or bathing?: No Independently performs ADLs?: Yes (appropriate for developmental age) Does the patient have difficulty walking or climbing stairs?: No Weakness of Legs: None Weakness of Arms/Hands: None  Permission Sought/Granted Permission sought to share information with : Case Manager                Emotional Assessment Appearance:: Appears stated age Attitude/Demeanor/Rapport: Unresponsive,Unable to Assess Affect (typically observed): Unable to Assess     Psych Involvement: No (comment)  Admission diagnosis:  S/P MVR (mitral valve replacement) [Z95.2] Patient Active Problem List   Diagnosis Date Noted  . S/P mitral valve replacement with Onyx bileaflet mechanical valve 04/29/2020  . S/P Maze operation for atrial fibrillation 04/29/2020  . S/P MVR (mitral valve replacement) 04/29/2020  . BMI 39.0-39.9,adult 09/30/2019  . 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 Right knee pain 11/27/2018  . GERD (gastroesophageal reflux disease) 10/25/2018  . CKD (chronic kidney disease), stage III ([place]) 10/25/2018  . CHF (congestive heart failure) ([place]) 10/25/2018  . Absolute anemia 10/01/2018  . Iron deficiency anemia secondary to blood loss (chronic)   . Polyp of descending colon   . (HFpEF) heart  failure with preserved ejection fraction ([place])   . Renal calculus, right 07/03/2018  . Low back pain 06/27/2018  . Pedal edema 06/27/2018  . Abnormal renal function 02/16/2018  . Acute recurrent pansinusitis 01/13/2018  . Anxiety 01/13/2018  . Osteoarthritis of knee 01/09/2018  . Morbid obesity with body mass index (BMI) of 40.0 or higher ([place]) 11/13/2017  .  Major depressive disorder 09/22/2017  . Mitral valve stenosis   . Atrial fibrillation ([place]) 09/11/2017  . Hypertension 06/06/2017  . Tobacco dependency 06/06/2017  . Acute upper respiratory infection 04/28/2017  . Pain of left lower extremity 02/11/2016  . 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 Fibrocystic breast disease 03/23/2013   PCP:  Lavera Guise, MD Pharmacy:   [id number] [address], [place] [address] Phone: [phone] Fax: [phone]     Social Determinants of Health (SDOH) Interventions Food Insecurity Interventions: Intervention Not Indicated Financial Strain Interventions: Intervention Not Indicated Housing Interventions: Intervention Not Indicated Transportation Interventions: Intervention Not Indicated  Readmission Risk Interventions Readmission Risk Prevention Plan 04/10/2019  Transportation Screening Complete  Home Care Screening Complete  Medication Review (RN CM) Complete  Some recent data might be hidden   Alpha Chouinard, MSW, LCSWA [phone] Heart Failure Social Worker

## 2020-05-08 LAB — GLUCOSE, CAPILLARY
Glucose-Capillary: 117 mg/dL — ABNORMAL HIGH (ref 70–99)
Glucose-Capillary: 122 mg/dL — ABNORMAL HIGH (ref 70–99)
Glucose-Capillary: 94 mg/dL (ref 70–99)
Glucose-Capillary: 124 mg/dL — ABNORMAL HIGH (ref 70–99)

## 2020-05-08 LAB — RENAL FUNCTION PANEL
Albumin: 2.8 g/dL — ABNORMAL LOW (ref 3.5–5.0)
Anion gap: 10 (ref 5–15)
BUN: 58 mg/dL — ABNORMAL HIGH (ref 6–20)
CO2: 26 mmol/L (ref 22–32)
Calcium: 8.6 mg/dL — ABNORMAL LOW (ref 8.9–10.3)
Chloride: 95 mmol/L — ABNORMAL LOW (ref 98–111)
Creatinine, Ser: 2.24 mg/dL — ABNORMAL HIGH (ref 0.44–1.00)
GFR, Estimated: 26 mL/min — ABNORMAL LOW (ref >60)
Glucose, Bld: 134 mg/dL — ABNORMAL HIGH (ref 70–99)
Phosphorus: 3 mg/dL (ref 2.5–4.6)
Potassium: 3.7 mmol/L (ref 3.5–5.1)
Sodium: 131 mmol/L — ABNORMAL LOW (ref 135–145)

## 2020-05-08 LAB — CBC
HCT: 27.8 % — ABNORMAL LOW (ref 36.0–46.0)
Hemoglobin: 9.2 g/dL — ABNORMAL LOW (ref 12.0–15.0)
MCH: 29.4 pg (ref 26.0–34.0)
MCHC: 33.1 g/dL (ref 30.0–36.0)
MCV: 88.8 fL (ref 80.0–100.0)
Platelets: 451 10*3/uL — ABNORMAL HIGH (ref 150–400)
RBC: 3.13 MIL/uL — ABNORMAL LOW (ref 3.87–5.11)
RDW: 15.1 % (ref 11.5–15.5)
WBC: 12.9 10*3/uL — ABNORMAL HIGH (ref 4.0–10.5)
nRBC: 0.2 % (ref 0.0–0.2)

## 2020-05-08 LAB — MAGNESIUM: Magnesium: 2 mg/dL (ref 1.7–2.4)

## 2020-05-08 LAB — PROTIME-INR
INR: 1.6 — ABNORMAL HIGH (ref 0.8–1.2)
Prothrombin Time: 18.8 seconds — ABNORMAL HIGH (ref 11.4–15.2)

## 2020-05-08 MED ORDER — POTASSIUM CHLORIDE CRYS ER 20 MEQ PO TBCR
40.0000 meq | EXTENDED_RELEASE_TABLET | Freq: Three times a day (TID) | ORAL | Status: AC
  Administered 2020-05-08 (×3): 40 meq via ORAL
  Filled 2020-05-08 (×3): qty 2

## 2020-05-08 MED ORDER — METOPROLOL TARTRATE 25 MG PO TABS: 37.5000 mg | ORAL_TABLET | Freq: Two times a day (BID) | ORAL | Status: DC

## 2020-05-08 MED ORDER — METOPROLOL TARTRATE 50 MG PO TABS
50.0000 mg | ORAL_TABLET | Freq: Two times a day (BID) | ORAL | Status: DC
  Administered 2020-05-08 – 2020-05-09 (×3): 50 mg via ORAL
  Filled 2020-05-08 (×3): qty 1

## 2020-05-08 MED ORDER — MAGNESIUM SULFATE 2 GM/50ML IV SOLN
2.0000 g | Freq: Once | INTRAVENOUS | Status: AC
  Administered 2020-05-08: 2 g via INTRAVENOUS
  Filled 2020-05-08: qty 50

## 2020-05-08 NOTE — Progress Notes (Signed)
Mobility Specialist: Progress Note   05/08/20 1351  Mobility  Activity Ambulated in hall  Level of Assistance Standby assist, set-up cues, supervision of patient - no hands on  Assistive Device Front wheel walker  Distance Ambulated (ft) 160 ft  Mobility Response Tolerated fair  Mobility performed by Mobility specialist  Bed Position Chair  $Mobility charge 1 Mobility   Pre-Mobility: 98 HR, 140/88 BP, 98% SpO2 Post-Mobility: 107 HR, 163/85 BP, 97% SpO2  Pt stopped 3x for standing breaks due to DOE. Pt otherwise asx during ambulation. Encouraged pt to continue IS use and walks with staff. Pt back to chair after walk per request.   Harrell Gave Jem Castro Mobility Specialist Mobility Specialist Phone: [phone]

## 2020-05-08 NOTE — Progress Notes (Addendum)
[address]       [place] [address]             [phone]      9 Days Post-Op Procedure(s) (LRB): MITRAL VALVE (MV) REPLACEMENT USING ON-X VALVE SIZE 27/29MM (N/A) MAZE (N/A) TRANSESOPHAGEAL ECHOCARDIOGRAM (TEE) (N/A) CLIPPING OF ATRIAL APPENDAGE USING ATRICURE  PRO2 CLIP SIZE 45MM Subjective: conts to feel better  Objective: Vital signs in last 24 hours: Temp:  [97.7 F (36.5 C)-98.8 F (37.1 C)] 97.7 F (36.5 C) (04/21 0324) Pulse Rate:  [101-110] 106 (04/21 0324) Cardiac Rhythm: Heart block (04/20 1955) Resp:  [18-21] 19 (04/21 0324) BP: (138-176)/(60-78) 149/67 (04/21 0324) SpO2:  [91 %-97 %] 97 % (04/21 0324) Weight:  [113.3 kg-113.8 kg] 113.3 kg (04/21 0324)  Hemodynamic parameters for last 24 hours: CVP:  [10 mmHg-18 mmHg] 17 mmHg  Intake/Output from previous day: 04/20 0701 - 04/21 0700 In: 595.6 [P.O.:440; I.V.:155.6] Out: 304 [Urine:302; Stool:2] Intake/Output this shift: No intake/output data recorded.  General appearance: alert, cooperative and no distress Heart: regular rate and rhythm and tachy Lungs: min dim in bases Abdomen: benign Extremities: minor edema Wound: incis healing well  Lab Results: Recent Labs    05/07/20 0439 05/08/20 0250  WBC 13.6* 12.9*  HGB 9.5* 9.2*  HCT 28.1* 27.8*  PLT 406* 451*   BMET:  Recent Labs    05/07/20 0439 05/08/20 0250  NA 130* 131*  K 3.6 3.7  CL 89* 95*  CO2 27 26  GLUCOSE 157* 134*  BUN 61* 58*  CREATININE 2.41* 2.24*  CALCIUM 8.8* 8.6*    PT/INR:  Recent Labs    05/08/20 0250  LABPROT 18.8*  INR 1.6*   ABG    Component Value Date/Time   PHART 7.341 (L) 04/30/2020 2201   HCO3 22.9 04/30/2020 2201   TCO2 24 04/30/2020 2201   ACIDBASEDEF 3.0 (H) 04/30/2020 2201   O2SAT 60.8 05/07/2020 2243   CBG (last 3)  Recent Labs    05/07/20 1122 05/07/20 1625 05/08/20 0708  GLUCAP 120* 131* 117*    Meds Scheduled Meds: . sodium chloride   Intravenous Once   . atorvastatin  80 mg Oral Daily  . bisacodyl  10 mg Oral Daily   Or  . bisacodyl  10 mg Rectal Daily  . Chlorhexidine Gluconate Cloth  6 each Topical Daily  . docusate sodium  200 mg Oral Daily  . enoxaparin (LOVENOX) injection  30 mg Subcutaneous Q24H  . ferrous [id number] C-folic acid  1 capsule Oral Q breakfast  . insulin aspart  0-24 Units Subcutaneous TID WC  . mouth rinse  15 mL Mouth Rinse BID  . metoprolol tartrate  25 mg Oral BID  . pantoprazole  40 mg Oral BID AC  . polyethylene glycol  17 g Oral Daily  . potassium chloride  40 mEq Oral TID WC  . sodium chloride flush  3 mL Intravenous Q12H  . torsemide  40 mg Oral BID  . warfarin  5 mg Oral q1600  . Warfarin - Physician Dosing Inpatient   Does not apply q1600   Continuous Infusions: . sodium chloride 250 mL (05/06/20 1140)  . lactated ringers    . magnesium sulfate bolus IVPB    . milrinone 0.125 mcg/kg/min (05/08/20 0737)   PRN Meds:.calcium carbonate, hydrALAZINE, ondansetron (ZOFRAN) IV, sodium chloride flush, traMADol  Xrays DG Abd 1 View  Result Date: 05/06/2020 CLINICAL DATA:  Abdomen distension EXAM: ABDOMEN -  1 VIEW COMPARISON:  03/24/2020 FINDINGS: Limited by habitus. Nonobstructed gas pattern. Faintly visible calcification in the region of right kidney. IMPRESSION: Nonobstructed gas pattern. Electronically Signed   By: Donavan Foil M.D.   On: 05/06/2020 21:04    Assessment/Plan: S/P Procedure(s) (LRB): MITRAL VALVE (MV) REPLACEMENT USING ON-X VALVE SIZE 27/29MM (N/A) MAZE (N/A) TRANSESOPHAGEAL ECHOCARDIOGRAM (TEE) (N/A) CLIPPING OF ATRIAL APPENDAGE USING ATRICURE  PRO2 CLIP SIZE 45MM  1 afeb, VSS with some HTN SBP 130'2 to 170 range- CO-OX 60- poss stop milrinone soon. Afib and junct tachycardias ,  Beta blocker restarted yesterday, will increase dose. 2 sats good on RA 3 renal fxn improving with BUN 58 and creat 2.24- nephrology conts to manage  4 leukocytosis trend improved/stable  5  H/H relatively stable , equalibrating  6 increasing platelet count, nonspecific - monitor clinically, on lovenox, coumadin 7 INR slowly trending up 1.6 today- will leave at 5 mg today, will need pacer wires out soon  8 K+ and MG++ - replacement ordered 9 now on statin per cards, AHF team conts to manage CHF issues as well  LOS: 9 days    John Giovanni PA-C Pager [phone] 05/08/2020   I have seen and examined the patient and agree with the assessment and plan as outlined.  Doing better every day.  Rhythm stable NSR - sinus tach last 24 hours.  Increase metoprolol further - was on 150 mg daily preop in divided doses.  Continue slow milrinone wean.  Rexene Alberts, MD 05/08/2020 8:10 AM

## 2020-05-08 NOTE — Progress Notes (Signed)
[facility] ROUNDING NOTE   Subjective:   Brief history: This is a 55 year old lady with past medical history of mitral stenosis chronic kidney disease with a baseline serum creatinine 1.1 to 1.3 mg/dL.  She underwent mitral valve replacement with maze and left atrial appendage clipping on 04/29/2020.  Hospital course complicated by severe nonoliguric acute kidney injury secondary to acute tubular necrosis.  Urinalysis bland with trace RBCs noted on high-powered field.  Renal ultrasound without hydronephrosis did have some mild symmetric bladder wall thickening.  Blood pressure 158/80 pulse 109 temperature 97.7 O2 sats 96% room air.  Urine output 1.078   05/07/20   IV milrinone  Sodium 131 potassium 3.7 chloride 95 CO2 26 BUN 38 creatinine 2.24 glucose 134 calcium 8.6 albumin 2.8 hemoglobin 9.2       Objective:  Vital signs in last 24 hours:  Temp:  [97.7 F (36.5 C)-98.8 F (37.1 C)] 97.7 F (36.5 C) (04/21 0324) Pulse Rate:  [101-110] 109 (04/21 0833) Resp:  [18-20] 19 (04/21 0324) BP: (138-174)/(60-80) 158/80 (04/21 0833) SpO2:  [91 %-97 %] 97 % (04/21 0324) Weight:  [113.3 kg-113.8 kg] 113.3 kg (04/21 0324)  Weight change: -0.8 kg Filed Weights   05/07/20 0600 05/07/20 1112 05/08/20 0324  Weight: 114.6 kg 113.8 kg 113.3 kg    Intake/Output: I/O last 3 completed shifts: In: 1040.1 [P.O.:780; I.V.:260.1] Out: 2130 [Urine:1427; Stool:2]   Intake/Output this shift:  No intake/output data recorded.  Constitutional: sitting in chair, no distress ENMT: ears and nose without scars or lesions, MMM CV: normal rate, trace bilateral LEE Respiratory: Bilateral chest rise with no increased work of breathing Gastrointestinal: soft, non-tender, no palpable masses or hernias Skin: no visible lesions or rashes Psych: alert, judgement/insight appropriate, appropriate mood and affect   Basic Metabolic Panel: Recent Labs  Lab 05/04/20 0500 05/04/20 1521  05/05/20 0350 05/05/20 1652 05/06/20 0441 05/07/20 0439 05/08/20 0250  NA 132*  131*   < > 127* 129* 132* 130* 131*  K 2.7*  2.7*   < > 4.0 4.1 4.3 3.6 3.7  CL 88*  87*   < > 89* 90* 92* 89* 95*  CO2 30  30   < > 28 27 28 27 26   GLUCOSE 120*  119*   < > 200* 180* 109* 157* 134*  BUN 62*  64*   < > 59* 58* 63* 61* 58*  CREATININE 2.77*  2.68*   < > 2.40* 2.18* 2.54* 2.41* 2.24*  CALCIUM 8.8*  8.7*   < > 8.6* 9.1 9.2 8.8* 8.6*  MG 2.6*  --   --   --   --   --  2.0  PHOS 4.8*  --  2.9  --  3.3 3.7 3.0   < > = values in this interval not displayed.    Liver Function Tests: Recent Labs  Lab 05/02/20 0409 05/03/20 0349 05/04/20 0500 05/05/20 0350 05/06/20 0441 05/07/20 0439 05/08/20 0250  AST 116* 73* 50* 36  --   --   --   ALT 54* 49* 44 33  --   --   --   ALKPHOS 83 101 93 84  --   --   --   BILITOT 0.7 0.6 0.7 0.8  --   --   --   PROT 6.4* 6.4* 6.7 6.3*  --   --   --   ALBUMIN 3.0* 2.8* 2.9*  2.8* 2.6* 2.7* 2.9* 2.8*   No results for input(s): LIPASE,  AMYLASE in the last 168 hours. No results for input(s): AMMONIA in the last 168 hours.  CBC: Recent Labs  Lab 05/04/20 0500 05/05/20 0350 05/06/20 0441 05/07/20 0439 05/08/20 0250  WBC 10.0 11.0* 12.0* 13.6* 12.9*  HGB 8.2* 7.5* 7.8* 9.5* 9.2*  HCT 24.6* 23.0* 23.5* 28.1* 27.8*  MCV 86.9 87.8 88.7 87.3 88.8  PLT 172 223 325 406* 451*    Cardiac Enzymes: No results for input(s): CKTOTAL, CKMB, CKMBINDEX, TROPONINI in the last 168 hours.  BNP: Invalid input(s): POCBNP  CBG: Recent Labs  Lab 05/06/20 1601 05/07/20 0650 05/07/20 1122 05/07/20 1625 05/08/20 0708  GLUCAP 114* 120* 120* 131* 117*    Microbiology: Results for orders placed or performed during the hospital encounter of 04/25/20  Surgical pcr screen     Status: None   Collection Time: 04/25/20 10:06 AM   Specimen: Nasal Mucosa; Nasal Swab  Result Value Ref Range Status   MRSA, PCR NEGATIVE NEGATIVE Final   Staphylococcus aureus  NEGATIVE NEGATIVE Final    Comment: (NOTE) The Xpert SA Assay (FDA approved for NASAL specimens in patients 26 years of age and older), is one component of a comprehensive surveillance program. It is not intended to diagnose infection nor to guide or monitor treatment. Performed at [facility], [place] [address] [place], [place] [address]     Coagulation Studies: Recent Labs    05/06/20 0441 05/07/20 0439 05/08/20 0250  LABPROT 17.3* 17.8* 18.8*  INR 1.4* 1.5* 1.6*    Urinalysis: No results for input(s): COLORURINE, LABSPEC, PHURINE, GLUCOSEU, HGBUR, BILIRUBINUR, KETONESUR, PROTEINUR, UROBILINOGEN, NITRITE, LEUKOCYTESUR in the last 72 hours.  Invalid input(s): APPERANCEUR    Imaging: DG Abd 1 View  Result Date: 05/06/2020 CLINICAL DATA:  Abdomen distension EXAM: ABDOMEN - 1 VIEW COMPARISON:  03/24/2020 FINDINGS: Limited by habitus. Nonobstructed gas pattern. Faintly visible calcification in the region of right kidney. IMPRESSION: Nonobstructed gas pattern. Electronically Signed   By: Donavan Foil M.D.   On: 05/06/2020 21:04     Medications:   . sodium chloride 250 mL (05/06/20 1140)  . lactated ringers    . milrinone 0.125 mcg/kg/min (05/08/20 0737)   . sodium chloride   Intravenous Once  . atorvastatin  80 mg Oral Daily  . bisacodyl  10 mg Oral Daily   Or  . bisacodyl  10 mg Rectal Daily  . Chlorhexidine Gluconate Cloth  6 each Topical Daily  . docusate sodium  200 mg Oral Daily  . enoxaparin (LOVENOX) injection  30 mg Subcutaneous Q24H  . ferrous [id number] C-folic acid  1 capsule Oral Q breakfast  . insulin aspart  0-24 Units Subcutaneous TID WC  . mouth rinse  15 mL Mouth Rinse BID  . metoprolol tartrate  50 mg Oral BID  . pantoprazole  40 mg Oral BID AC  . polyethylene glycol  17 g Oral Daily  . potassium chloride  40 mEq Oral TID WC  . sodium chloride flush  3 mL Intravenous Q12H  . torsemide  40 mg Oral BID  . warfarin  5 mg Oral q1600   . Warfarin - Physician Dosing Inpatient   Does not apply q1600   calcium carbonate, hydrALAZINE, ondansetron (ZOFRAN) IV, sodium chloride flush, traMADol  Assessment/ Plan:  1. Non-Oliguric AKI, severe, improving: Likely secondary to ATN from hypoperfusion during surgery.  No signs of obvious obstruction.  Creatinine  slight increase noted without any obvious signs of nephrotoxin administration.  No hemodynamic instability  -Continue to  monitor daily Cr, Dose meds for GFR -Monitor Daily I/Os, Daily weight  -Maintain MAP>65 for optimal renal perfusion.  -Avoid nephrotoxic medications including NSAIDs and Vanc/Zosyn combo  2. Volume overload/heart failure exacerbation: Volume overload related to surgery, decreased renal function, heart failure.  Volume status improving with excellent urine output.  Continues on torsemide 40 mg twice daily  3. Mitral valve stenosis: s/p repair. Surgery managing.  Anticoagulation with Coumadin  4. Anemia: Likely multifactorial with acute illness contributing. Management per primary and transfuse prn  5. Hypokalemia:   Appears to have resolved   LOS: [place] @TODAY @9 :53 AM

## 2020-05-08 NOTE — Progress Notes (Addendum)
Advanced Heart Failure Rounding Note  PCP-Cardiologist: Ida Rogue, MD   Subjective:   Echo 4/15 EF 60-65% MVR ok   Creatinine coming down 2.4>2.2  Earlier today milrinone cut back to 0.125 mcg.   Feeling a little better.    Objective:   Weight Range: 113.3 kg Body mass index is 42.87 kg/m.   Vital Signs:   Temp:  [97.7 F (36.5 C)-98.8 F (37.1 C)] 98.2 F (36.8 C) (04/21 1240) Pulse Rate:  [97-110] 97 (04/21 1240) Resp:  [18-20] 18 (04/21 1240) BP: (138-174)/(60-87) 152/87 (04/21 1240) SpO2:  [93 %-97 %] 97 % (04/21 1240) Weight:  [113.3 kg] 113.3 kg (04/21 0324) Last BM Date: 05/07/20  Weight change: Filed Weights   05/07/20 0600 05/07/20 1112 05/08/20 0324  Weight: 114.6 kg 113.8 kg 113.3 kg    Intake/Output:   Intake/Output Summary (Last 24 hours) at 05/08/2020 1246 Last data filed at 05/08/2020 0406 Gross per 24 hour  Intake 320.79 ml  Output 3 ml  Net 317.79 ml      Physical Exam  CVP in the chair 15-16. General:  Sitting in the chair.  No resp difficulty HEENT: normal Neck: supple. JVP difficult to assess.  Carotids 2+ bilat; no bruits. No lymphadenopathy or thryomegaly appreciated. Cor: PMI nondisplaced. Regular rate & rhythm. No rubs, gallops or murmurs. Lungs: clear Abdomen: soft, nontender, nondistended. No hepatosplenomegaly. No bruits or masses. Good bowel sounds. Extremities: no cyanosis, clubbing, rash, R and LLE 1+edema. RUE PICC Neuro: alert & orientedx3, cranial nerves grossly intact. moves all 4 extremities w/o difficulty. Affect pleasant   Telemetry   SR -ST 90-100s   Labs    CBC Recent Labs    05/07/20 0439 05/08/20 0250  WBC 13.6* 12.9*  HGB 9.5* 9.2*  HCT 28.1* 27.8*  MCV 87.3 88.8  PLT 406* 924*   Basic Metabolic Panel Recent Labs    05/07/20 0439 05/08/20 0250  NA 130* 131*  K 3.6 3.7  CL 89* 95*  CO2 27 26  GLUCOSE 157* 134*  BUN 61* 58*  CREATININE 2.41* 2.24*  CALCIUM 8.8* 8.6*  MG  --  2.0   PHOS 3.7 3.0   Liver Function Tests Recent Labs    05/07/20 0439 05/08/20 0250  ALBUMIN 2.9* 2.8*   No results for input(s): LIPASE, AMYLASE in the last 72 hours. Cardiac Enzymes No results for input(s): CKTOTAL, CKMB, CKMBINDEX, TROPONINI in the last 72 hours.  BNP: BNP (last 3 results) No results for input(s): BNP in the last 8760 hours.  ProBNP (last 3 results) No results for input(s): PROBNP in the last 8760 hours.   D-Dimer No results for input(s): DDIMER in the last 72 hours. Hemoglobin A1C No results for input(s): HGBA1C in the last 72 hours. Fasting Lipid Panel No results for input(s): CHOL, HDL, LDLCALC, TRIG, CHOLHDL, LDLDIRECT in the last 72 hours. Thyroid Function Tests No results for input(s): TSH, T4TOTAL, T3FREE, THYROIDAB in the last 72 hours.  Invalid input(s): FREET3  Other results:   Imaging    No results found.   Medications:     Scheduled Medications: . sodium chloride   Intravenous Once  . atorvastatin  80 mg Oral Daily  . bisacodyl  10 mg Oral Daily   Or  . bisacodyl  10 mg Rectal Daily  . Chlorhexidine Gluconate Cloth  6 each Topical Daily  . docusate sodium  200 mg Oral Daily  . enoxaparin (LOVENOX) injection  30 mg Subcutaneous Q24H  .  ferrous [id number] C-folic acid  1 capsule Oral Q breakfast  . insulin aspart  0-24 Units Subcutaneous TID WC  . mouth rinse  15 mL Mouth Rinse BID  . metoprolol tartrate  50 mg Oral BID  . pantoprazole  40 mg Oral BID AC  . polyethylene glycol  17 g Oral Daily  . potassium chloride  40 mEq Oral TID WC  . sodium chloride flush  3 mL Intravenous Q12H  . torsemide  40 mg Oral BID  . warfarin  5 mg Oral q1600  . Warfarin - Physician Dosing Inpatient   Does not apply q1600    Infusions: . sodium chloride 250 mL (05/06/20 1140)  . lactated ringers    . milrinone 0.125 mcg/kg/min (05/08/20 0737)    PRN Medications: calcium carbonate, hydrALAZINE, ondansetron (ZOFRAN) IV, sodium  chloride flush, traMADol    Patient Profile   55 y/o woman with morbid obesity, HTN, PAF, OSA and rheumtic heart disease with severe MS. Underwent mechanical MVR and maze on 4/12. Post op course c/b pulmonary edema requiring BIPAP + nonoliguric AKI.   Assessment/Plan   1. Severe Mitral Valve Stenosis - 2/2 rheumatic heart disease  - s/p mechanical MVR 4/12 - on warfarin/ASA, INR 1.5.   2. Acute CHF/Pulmonary Edema>>Cardiogenic Shock  - perioperative TEE EF 60-65% with severe mitral stenosis. - developed acute pulmonary edema post op + AKI - initial Co-ox 43%>>started on milrinone + Dopamine  - Echo 4/15 EF 60-65% MVR ok  - Earlier milrinone cut back to 0.125 mcg. Check CO-OX in am - CVP 15. Continue torsemide per Nephrology.  Continue PO torsemide 40 mg bid  3. AKI  -Creatinine peaked at 3.8, today 2.24    - nephrology following  - suspect post-op ATN - Improving. Continue to follow  4. Acute Hypoxemic Respiratory Failure - due to pulmonary edema - much improved with  IV diuresis - O2 sats stable on room air.   5. PAF - s/p MAZE at time of MVR  - in ST on tele, HR should improve w/ milrinone wean   - INR 1.6 plan lifelong warfarin given mechanical MV   6. Hypokalemia - resolved, 3.7 - Continue spiro  7. Hyponatremia - 2/2 hypervolemia - Na 131  - continue diuretics   8. Anemia - Transfused 2 units RBCs a few days ago - Hgb 7.8>>9.5 >9.2    Length of Stay: [place], NP  05/08/2020, 12:46 PM  Advanced Heart Failure Team Pager [phone] (M-F; 7a - 5p)  Please contact [facility] for night-coverage after hours (5p -7a ) and weekends on [email or web address]   Patient seen and examined with the above-signed Advanced Practice Provider and/or Housestaff. I personally reviewed laboratory data, imaging studies and relevant notes. I independently examined the patient and formulated the important aspects of the plan. I have edited the note to reflect any of my changes  or salient points. I have personally discussed the plan with the patient and/or family.  Remains on milrinone Now cut back to 0.125. Diuresing well weight down 1 pound on oral torsemide. SCr improving slowly. INR 1.6  General:  Well appearing. No resp difficulty HEENT: normal Neck: supple. no JVD. Carotids 2+ bilat; no bruits. No lymphadenopathy or thryomegaly appreciated. Cor: PMI nondisplaced. Regular rate & rhythm Mechanical s1. Lungs: clear Abdomen: obese soft, nontender, nondistended. No hepatosplenomegaly. No bruits or masses. Good bowel sounds. Extremities: no cyanosis, clubbing, rash, tr edema Neuro: alert & orientedx3, cranial nerves grossly intact. moves  all 4 extremities w/o difficulty. Affect pleasant  Improving slowly. Agree with weaning milrinone. Hopefully can stop tomorrow. Continue torsemide. INR 1.6 managed by TCTS.   Glori Bickers, MD  4:39 PM

## 2020-05-08 NOTE — Progress Notes (Signed)
CARDIAC REHAB PHASE I   Went to offer to walk with pt. Pt recently ambulated with mobility tech. Encouraged continued ambulation and IS use. Will continue to follow.  Rufina Falco, RN BSN 05/08/2020 2:06 PM

## 2020-05-09 LAB — COOXEMETRY PANEL
Carboxyhemoglobin: 1.3 % (ref 0.5–1.5)
Methemoglobin: 0.9 % (ref 0.0–1.5)
O2 Saturation: 52.4 %
Total hemoglobin: 8.3 g/dL — ABNORMAL LOW (ref 12.0–16.0)

## 2020-05-09 LAB — GLUCOSE, CAPILLARY
Glucose-Capillary: 123 mg/dL — ABNORMAL HIGH (ref 70–99)
Glucose-Capillary: 120 mg/dL — ABNORMAL HIGH (ref 70–99)
Glucose-Capillary: 116 mg/dL — ABNORMAL HIGH (ref 70–99)
Glucose-Capillary: 118 mg/dL — ABNORMAL HIGH (ref 70–99)

## 2020-05-09 LAB — BASIC METABOLIC PANEL
Anion gap: 9 (ref 5–15)
BUN: 46 mg/dL — ABNORMAL HIGH (ref 6–20)
CO2: 27 mmol/L (ref 22–32)
Calcium: 8.6 mg/dL — ABNORMAL LOW (ref 8.9–10.3)
Chloride: 97 mmol/L — ABNORMAL LOW (ref 98–111)
Creatinine, Ser: 1.95 mg/dL — ABNORMAL HIGH (ref 0.44–1.00)
GFR, Estimated: 30 mL/min — ABNORMAL LOW (ref >60)
Glucose, Bld: 170 mg/dL — ABNORMAL HIGH (ref 70–99)
Potassium: 3.8 mmol/L (ref 3.5–5.1)
Sodium: 133 mmol/L — ABNORMAL LOW (ref 135–145)

## 2020-05-09 LAB — PROTIME-INR
INR: 1.8 — ABNORMAL HIGH (ref 0.8–1.2)
Prothrombin Time: 20.6 seconds — ABNORMAL HIGH (ref 11.4–15.2)

## 2020-05-09 MED ORDER — METOLAZONE 5 MG PO TABS
5.0000 mg | ORAL_TABLET | Freq: Once | ORAL | Status: AC
  Administered 2020-05-09: 5 mg via ORAL
  Filled 2020-05-09: qty 1

## 2020-05-09 MED ORDER — AMLODIPINE BESYLATE 5 MG PO TABS: 5.0000 mg | ORAL_TABLET | Freq: Every day | ORAL | 1 refills | Status: DC

## 2020-05-09 MED ORDER — ATORVASTATIN CALCIUM 80 MG PO TABS
80.0000 mg | ORAL_TABLET | Freq: Every evening | ORAL | Status: DC
Start: 2020-05-09 — End: 2020-06-02

## 2020-05-09 MED ORDER — TRAMADOL HCL 50 MG PO TABS: 50.0000 mg | ORAL_TABLET | Freq: Four times a day (QID) | ORAL | 0 refills | Status: AC | PRN

## 2020-05-09 MED ORDER — POTASSIUM CHLORIDE CRYS ER 20 MEQ PO TBCR: 20.0000 meq | EXTENDED_RELEASE_TABLET | Freq: Every day | ORAL | 1 refills | Status: DC

## 2020-05-09 MED ORDER — FE FUMARATE-B12-VIT C-FA-IFC PO CAPS: 1.0000 | ORAL_CAPSULE | Freq: Two times a day (BID) | ORAL | 2 refills | Status: DC

## 2020-05-09 MED ORDER — POTASSIUM CHLORIDE CRYS ER 20 MEQ PO TBCR
40.0000 meq | EXTENDED_RELEASE_TABLET | Freq: Once | ORAL | Status: AC
  Administered 2020-05-09: 40 meq via ORAL
  Filled 2020-05-09: qty 2

## 2020-05-09 MED ORDER — AMLODIPINE BESYLATE 5 MG PO TABS
5.0000 mg | ORAL_TABLET | Freq: Every day | ORAL | Status: DC
  Administered 2020-05-09: 5 mg via ORAL
  Filled 2020-05-09 (×2): qty 1

## 2020-05-09 MED ORDER — METOPROLOL TARTRATE 25 MG PO TABS
25.0000 mg | ORAL_TABLET | Freq: Two times a day (BID) | ORAL | Status: DC
  Administered 2020-05-09 – 2020-05-11 (×4): 25 mg via ORAL
  Filled 2020-05-09 (×4): qty 1

## 2020-05-09 MED ORDER — WARFARIN SODIUM 5 MG PO TABS: 5.0000 mg | ORAL_TABLET | Freq: Every day | ORAL | 2 refills | Status: DC

## 2020-05-09 MED ORDER — METOPROLOL TARTRATE 25 MG PO TABS: 25.0000 mg | ORAL_TABLET | Freq: Two times a day (BID) | ORAL | 1 refills | Status: DC

## 2020-05-09 NOTE — Progress Notes (Signed)
Mobility Specialist: Progress Note   05/09/20 1100  Mobility  Activity Ambulated in hall  Level of Assistance Modified independent, requires aide device or extra time  Assistive Device Front wheel walker  Distance Ambulated (ft) 230 ft (140ft x 2)  Mobility Response Tolerated well  Mobility performed by Mobility specialist  Bed Position Chair  $Mobility charge 1 Mobility   Pre-Mobility: 96 HR, BP, SpO2 During Mobility: 104 HR, 95% SpO2 Post-Mobility: 104 HR, 166/78 BP, 96% SpO2  Pt was able to ambulate much further today before becoming SOB. Pt stopped after 159ft to take a short standing break and then ambulated back to the room. Pt in chair with call bell on her table.   [facility] Leslie [name] Mobility Specialist Mobility Specialist Phone: [phone]

## 2020-05-09 NOTE — Progress Notes (Addendum)
Advanced Heart Failure Rounding Note  PCP-Cardiologist: Ida Rogue, MD   Subjective:    Echo 4/15 EF 60-65% MVR ok   Creatinine coming down 2.4>2.2>1.9  Milrinone stopped this am. Feels great. Walking halls. Weight stable on po torsemide. CVP 20-22   Objective:   Weight Range: 113.1 kg Body mass index is 42.8 kg/m.   Vital Signs:   Temp:  [97.7 F (36.5 C)-98.5 F (36.9 C)] 97.7 F (36.5 C) (04/22 0809) Pulse Rate:  [82-102] 101 (04/22 0809) Resp:  [17-20] 19 (04/22 0809) BP: (122-185)/(66-92) 176/83 (04/22 0809) SpO2:  [97 %-100 %] 99 % (04/22 0809) Weight:  [113.1 kg-113.3 kg] 113.1 kg (04/22 0441) Last BM Date: 05/07/20  Weight change: Filed Weights   05/08/20 0324 05/09/20 0333 05/09/20 0441  Weight: 113.3 kg 113.3 kg 113.1 kg    Intake/Output:   Intake/Output Summary (Last 24 hours) at 05/09/2020 1227 Last data filed at 05/09/2020 0441 Gross per 24 hour  Intake 361.3 ml  Output --  Net 361.3 ml      Physical Exam   General:  Well appearing. No resp difficulty HEENT: normal Neck: supple. JVP to ear Carotids 2+ bilat; no bruits. No lymphadenopathy or thryomegaly appreciated. Cor: PMI nondisplaced. Regular rate & rhythm. mechnical s1 Lungs: clear Abdomen: obese soft, nontender, nondistended. No hepatosplenomegaly. No bruits or masses. Good bowel sounds. Extremities: no cyanosis, clubbing, rash, tr edema Neuro: alert & orientedx3, cranial nerves grossly intact. moves all 4 extremities w/o difficulty. Affect pleasant   Telemetry   Sinus 90-100  Labs    CBC Recent Labs    05/07/20 0439 05/08/20 0250  WBC 13.6* 12.9*  HGB 9.5* 9.2*  HCT 28.1* 27.8*  MCV 87.3 88.8  PLT 406* 277*   Basic Metabolic Panel Recent Labs    05/07/20 0439 05/08/20 0250 05/09/20 0522  NA 130* 131* 133*  K 3.6 3.7 3.8  CL 89* 95* 97*  CO2 27 26 27   GLUCOSE 157* 134* 170*  BUN 61* 58* 46*  CREATININE 2.41* 2.24* 1.95*  CALCIUM 8.8* 8.6* 8.6*  MG  --   2.0  --   PHOS 3.7 3.0  --    Liver Function Tests Recent Labs    05/07/20 0439 05/08/20 0250  ALBUMIN 2.9* 2.8*   No results for input(s): LIPASE, AMYLASE in the last 72 hours. Cardiac Enzymes No results for input(s): CKTOTAL, CKMB, CKMBINDEX, TROPONINI in the last 72 hours.  BNP: BNP (last 3 results) No results for input(s): BNP in the last 8760 hours.  ProBNP (last 3 results) No results for input(s): PROBNP in the last 8760 hours.   D-Dimer No results for input(s): DDIMER in the last 72 hours. Hemoglobin A1C No results for input(s): HGBA1C in the last 72 hours. Fasting Lipid Panel No results for input(s): CHOL, HDL, LDLCALC, TRIG, CHOLHDL, LDLDIRECT in the last 72 hours. Thyroid Function Tests No results for input(s): TSH, T4TOTAL, T3FREE, THYROIDAB in the last 72 hours.  Invalid input(s): FREET3  Other results:   Imaging    No results found.   Medications:     Scheduled Medications: . atorvastatin  80 mg Oral Daily  . bisacodyl  10 mg Oral Daily   Or  . bisacodyl  10 mg Rectal Daily  . Chlorhexidine Gluconate Cloth  6 each Topical Daily  . docusate sodium  200 mg Oral Daily  . enoxaparin (LOVENOX) injection  30 mg Subcutaneous Q24H  . ferrous [id number] C-folic acid  1 capsule Oral  Q breakfast  . insulin aspart  0-24 Units Subcutaneous TID WC  . mouth rinse  15 mL Mouth Rinse BID  . metoprolol tartrate  50 mg Oral BID  . pantoprazole  40 mg Oral BID AC  . polyethylene glycol  17 g Oral Daily  . sodium chloride flush  3 mL Intravenous Q12H  . torsemide  40 mg Oral BID  . warfarin  5 mg Oral q1600  . Warfarin - Physician Dosing Inpatient   Does not apply q1600    Infusions: . sodium chloride 250 mL (05/06/20 1140)  . lactated ringers      PRN Medications: calcium carbonate, hydrALAZINE, ondansetron (ZOFRAN) IV, sodium chloride flush, traMADol    Patient Profile   55 y/o woman with morbid obesity, HTN, PAF, OSA and rheumtic  heart disease with severe MS. Underwent mechanical MVR and maze on 4/12. Post op course c/b pulmonary edema requiring BIPAP + nonoliguric AKI.   Assessment/Plan   1. Severe Mitral Valve Stenosis - 2/2 rheumatic heart disease  - s/p mechanical MVR 4/12 - on warfarin/ASA, INR 1.8.  (Per TCTS)  2. Acute CHF/Pulmonary Edema>>Cardiogenic Shock  - perioperative TEE EF 60-65% with severe mitral stenosis. - developed acute pulmonary edema post op + AKI - initial Co-ox 43%>>started on milrinone + Dopamine  - Echo 4/15 EF 60-65% MVR ok  - Co-ox 52% on milrinone 0.125. Milrinone stopped this am. If co-ox >= 50% in am can probably go home once volume status under better control Will drop metoprolol to 25 bid - On torsemide 40 bid. CVP still elevated 20-22 range. Will give lasix 80 IV now with metolazone 5mg  x 1.   3. AKI  -Creatinine peaked at 3.8, today 2.24 -> 1.95 - nephrology following  - suspect post-op ATN - Improving. Continue to follow  4. Acute Hypoxemic Respiratory Failure - due to pulmonary edema - resolved  5. PAF - s/p MAZE at time of MVR  - remains in NSR - INR 1.8 plan lifelong warfarin given mechanical MV   6. Hypokalemia - resolved, 3.8 - Continue spiro  7. Hyponatremia - 2/2 hypervolemia - Na 131  - continue diuretics   8. Anemia - Transfused 2 units RBCs a few days ago - Hgb 7.8>>9.5 >9.2   9. HTN - BP remains high.  - No ACE/ARB with AKI - Will add amlodipine 5   Length of Stay: [place], MD  05/09/2020, 12:27 PM  Advanced Heart Failure Team Pager [phone] (M-F; 7a - 5p)  Please contact [facility] for night-coverage after hours (5p -7a ) and weekends on [email or web address]

## 2020-05-09 NOTE — Progress Notes (Addendum)
[address]       [place] [address]             [phone]      10 Days Post-Op Procedure(s) (LRB): MITRAL VALVE (MV) REPLACEMENT USING ON-X VALVE SIZE 27/29MM (N/A) MAZE (N/A) TRANSESOPHAGEAL ECHOCARDIOGRAM (TEE) (N/A) CLIPPING OF ATRIAL APPENDAGE USING ATRICURE  PRO2 CLIP SIZE 45MM Subjective: conts to feel well, getting stronger  Objective: Vital signs in last 24 hours: Temp:  [98.2 F (36.8 C)-98.5 F (36.9 C)] 98.5 F (36.9 C) (04/22 0333) Pulse Rate:  [82-109] 85 (04/22 0333) Cardiac Rhythm: Normal sinus rhythm;Heart block (04/21 1907) Resp:  [17-20] 17 (04/22 0333) BP: (122-185)/(66-92) 122/67 (04/22 0333) SpO2:  [96 %-98 %] 98 % (04/22 0333) Weight:  [113.1 kg-113.3 kg] 113.1 kg (04/22 0441)  Hemodynamic parameters for last 24 hours: CVP:  [11 mmHg-17 mmHg] 16 mmHg  Intake/Output from previous day: 04/21 0701 - 04/22 0700 In: 601.3 [P.O.:490; I.V.:111.3] Out: -  Intake/Output this shift: No intake/output data recorded.  General appearance: alert, cooperative and no distress Heart: regular rate and rhythm and tachy Lungs: mildly dim in L>R base Abdomen: benign Extremities: + LE edema Wound: incis healing well  Lab Results: Recent Labs    05/07/20 0439 05/08/20 0250  WBC 13.6* 12.9*  HGB 9.5* 9.2*  HCT 28.1* 27.8*  PLT 406* 451*   BMET:  Recent Labs    05/08/20 0250 05/09/20 0522  NA 131* 133*  K 3.7 3.8  CL 95* 97*  CO2 26 27  GLUCOSE 134* 170*  BUN 58* 46*  CREATININE 2.24* 1.95*  CALCIUM 8.6* 8.6*    PT/INR:  Recent Labs    05/09/20 0522  LABPROT 20.6*  INR 1.8*   ABG    Component Value Date/Time   PHART 7.341 (L) 04/30/2020 2201   HCO3 22.9 04/30/2020 2201   TCO2 24 04/30/2020 2201   ACIDBASEDEF 3.0 (H) 04/30/2020 2201   O2SAT 52.4 05/09/2020 0522   CBG (last 3)  Recent Labs    05/08/20 1617 05/08/20 2213 05/09/20 0627  GLUCAP 94 124* 123*    Meds Scheduled Meds: . sodium chloride    Intravenous Once  . atorvastatin  80 mg Oral Daily  . bisacodyl  10 mg Oral Daily   Or  . bisacodyl  10 mg Rectal Daily  . Chlorhexidine Gluconate Cloth  6 each Topical Daily  . docusate sodium  200 mg Oral Daily  . enoxaparin (LOVENOX) injection  30 mg Subcutaneous Q24H  . ferrous [id number] C-folic acid  1 capsule Oral Q breakfast  . insulin aspart  0-24 Units Subcutaneous TID WC  . mouth rinse  15 mL Mouth Rinse BID  . metoprolol tartrate  50 mg Oral BID  . pantoprazole  40 mg Oral BID AC  . polyethylene glycol  17 g Oral Daily  . sodium chloride flush  3 mL Intravenous Q12H  . torsemide  40 mg Oral BID  . warfarin  5 mg Oral q1600  . Warfarin - Physician Dosing Inpatient   Does not apply q1600   Continuous Infusions: . sodium chloride 250 mL (05/06/20 1140)  . lactated ringers    . milrinone 0.125 mcg/kg/min (05/09/20 0441)   PRN Meds:.calcium carbonate, hydrALAZINE, ondansetron (ZOFRAN) IV, sodium chloride flush, traMADol  Xrays No results found.  Assessment/Plan: S/P Procedure(s) (LRB): MITRAL VALVE (MV) REPLACEMENT USING ON-X VALVE SIZE 27/29MM (N/A) MAZE (N/A) TRANSESOPHAGEAL ECHOCARDIOGRAM (TEE) (N/A) CLIPPING OF ATRIAL APPENDAGE USING  ATRICURE  PRO2 CLIP SIZE 45MM  1 afeb, VSS except SBP 120's to 180's range. CO-OX 52 on .125 milrinoneSR, ST, PVC's. HR control is much better overall, now on 50 BID metoprolol 2 sats 97% on RA 3 creat/BUN cont to improve- 1.95/46. weight conts to trend lower with good uop 4 INR conts to move towards therapeaudic, 1.8- cont 5 mg for now 5 Nephrology and AHF cont to assist with management 6 approaching d/c in next 1-2 days if no changes     LOS: 10 days    John Giovanni PA-C Pager [phone] 05/09/2020    I have seen and examined the patient and agree with the assessment and plan as outlined.  Doing very well.  Stop milrinone.  Tentatively plan D/C home tomorrow.  Rexene Alberts, MD 05/09/2020 9:49 AM

## 2020-05-09 NOTE — Progress Notes (Signed)
[facility] ROUNDING NOTE   Subjective:   Brief history: This is a 55 year old lady with past medical history of mitral stenosis chronic kidney disease with a baseline serum creatinine 1.1 to 1.3 mg/dL.  She underwent mitral valve replacement with maze and left atrial appendage clipping on 04/29/2020.  Hospital course complicated by severe nonoliguric acute kidney injury secondary to acute tubular necrosis.  Urinalysis bland with trace RBCs noted on high-powered field.  Renal ultrasound without hydronephrosis did have some mild symmetric bladder wall thickening.  Blood pressure 176/83 pulse 100 temperature 97.7 O2 sats 99% room air.  No recorded urine output 05/08/2020   IV milrinone  Sodium 133 potassium 3.8 chloride 97 CO2 27 BUN 46 creatinine 1.95 glucose 170 calcium 8.6       Objective:  Vital signs in last 24 hours:  Temp:  [97.7 F (36.5 C)-98.5 F (36.9 C)] 97.7 F (36.5 C) (04/22 0809) Pulse Rate:  [82-102] 101 (04/22 0809) Resp:  [17-20] 19 (04/22 0809) BP: (122-185)/(66-92) 176/83 (04/22 0809) SpO2:  [97 %-100 %] 99 % (04/22 0809) Weight:  [113.1 kg-113.3 kg] 113.1 kg (04/22 0441)  Weight change: -0.5 kg Filed Weights   05/08/20 0324 05/09/20 0333 05/09/20 0441  Weight: 113.3 kg 113.3 kg 113.1 kg    Intake/Output: I/O last 3 completed shifts: In: 868.8 [P.O.:690; I.V.:178.8] Out: 2 [Urine:2]   Intake/Output this shift:  No intake/output data recorded.  Constitutional: sitting in chair, no distress ENMT: ears and nose without scars or lesions, MMM CV: normal rate, trace bilateral LEE Respiratory: Bilateral chest rise with no increased work of breathing Gastrointestinal: soft, non-tender, no palpable masses or hernias Skin: no visible lesions or rashes Psych: alert, judgement/insight appropriate, appropriate mood and affect   Basic Metabolic Panel: Recent Labs  Lab 05/04/20 0500 05/04/20 1521 05/05/20 0350 05/05/20 1652 05/06/20 0441  05/07/20 0439 05/08/20 0250 05/09/20 0522  NA 132*  131*   < > 127* 129* 132* 130* 131* 133*  K 2.7*  2.7*   < > 4.0 4.1 4.3 3.6 3.7 3.8  CL 88*  87*   < > 89* 90* 92* 89* 95* 97*  CO2 30  30   < > 28 27 28 27 26 27   GLUCOSE 120*  119*   < > 200* 180* 109* 157* 134* 170*  BUN 62*  64*   < > 59* 58* 63* 61* 58* 46*  CREATININE 2.77*  2.68*   < > 2.40* 2.18* 2.54* 2.41* 2.24* 1.95*  CALCIUM 8.8*  8.7*   < > 8.6* 9.1 9.2 8.8* 8.6* 8.6*  MG 2.6*  --   --   --   --   --  2.0  --   PHOS 4.8*  --  2.9  --  3.3 3.7 3.0  --    < > = values in this interval not displayed.    Liver Function Tests: Recent Labs  Lab 05/03/20 0349 05/04/20 0500 05/05/20 0350 05/06/20 0441 05/07/20 0439 05/08/20 0250  AST 73* 50* 36  --   --   --   ALT 49* 44 33  --   --   --   ALKPHOS 101 93 84  --   --   --   BILITOT 0.6 0.7 0.8  --   --   --   PROT 6.4* 6.7 6.3*  --   --   --   ALBUMIN 2.8* 2.9*  2.8* 2.6* 2.7* 2.9* 2.8*   No results for  input(s): LIPASE, AMYLASE in the last 168 hours. No results for input(s): AMMONIA in the last 168 hours.  CBC: Recent Labs  Lab 05/04/20 0500 05/05/20 0350 05/06/20 0441 05/07/20 0439 05/08/20 0250  WBC 10.0 11.0* 12.0* 13.6* 12.9*  HGB 8.2* 7.5* 7.8* 9.5* 9.2*  HCT 24.6* 23.0* 23.5* 28.1* 27.8*  MCV 86.9 87.8 88.7 87.3 88.8  PLT 172 223 325 406* 451*    Cardiac Enzymes: No results for input(s): CKTOTAL, CKMB, CKMBINDEX, TROPONINI in the last 168 hours.  BNP: Invalid input(s): POCBNP  CBG: Recent Labs  Lab 05/08/20 0708 05/08/20 1239 05/08/20 1617 05/08/20 2213 05/09/20 0627  GLUCAP 117* 122* 94 124* 123*    Microbiology: Results for orders placed or performed during the hospital encounter of 04/25/20  Surgical pcr screen     Status: None   Collection Time: 04/25/20 10:06 AM   Specimen: Nasal Mucosa; Nasal Swab  Result Value Ref Range Status   MRSA, PCR NEGATIVE NEGATIVE Final   Staphylococcus aureus NEGATIVE NEGATIVE Final     Comment: (NOTE) The Xpert SA Assay (FDA approved for NASAL specimens in patients 83 years of age and older), is one component of a comprehensive surveillance program. It is not intended to diagnose infection nor to guide or monitor treatment. Performed at [facility], [place] [address] [place], [place] [address]     Coagulation Studies: Recent Labs    05/07/20 0439 05/08/20 0250 05/09/20 0522  LABPROT 17.8* 18.8* 20.6*  INR 1.5* 1.6* 1.8*    Urinalysis: No results for input(s): COLORURINE, LABSPEC, PHURINE, GLUCOSEU, HGBUR, BILIRUBINUR, KETONESUR, PROTEINUR, UROBILINOGEN, NITRITE, LEUKOCYTESUR in the last 72 hours.  Invalid input(s): APPERANCEUR    Imaging: No results found.   Medications:   . sodium chloride 250 mL (05/06/20 1140)  . lactated ringers     . atorvastatin  80 mg Oral Daily  . bisacodyl  10 mg Oral Daily   Or  . bisacodyl  10 mg Rectal Daily  . Chlorhexidine Gluconate Cloth  6 each Topical Daily  . docusate sodium  200 mg Oral Daily  . enoxaparin (LOVENOX) injection  30 mg Subcutaneous Q24H  . ferrous [id number] C-folic acid  1 capsule Oral Q breakfast  . insulin aspart  0-24 Units Subcutaneous TID WC  . mouth rinse  15 mL Mouth Rinse BID  . metoprolol tartrate  50 mg Oral BID  . pantoprazole  40 mg Oral BID AC  . polyethylene glycol  17 g Oral Daily  . sodium chloride flush  3 mL Intravenous Q12H  . torsemide  40 mg Oral BID  . warfarin  5 mg Oral q1600  . Warfarin - Physician Dosing Inpatient   Does not apply q1600   calcium carbonate, hydrALAZINE, ondansetron (ZOFRAN) IV, sodium chloride flush, traMADol  Assessment/ Plan:  1. Non-Oliguric AKI, severe, improving: Likely secondary to ATN from hypoperfusion during surgery.  No signs of obvious obstruction.  Creatinine  slight increase noted without any obvious signs of nephrotoxin administration.  No hemodynamic instability  -Continue to monitor daily Cr, Dose meds for  GFR -Monitor Daily I/Os, Daily weight  -Maintain MAP>65 for optimal renal perfusion.  -Avoid nephrotoxic medications including NSAIDs and Vanc/Zosyn combo  2. Volume overload/heart failure exacerbation: Volume overload related to surgery, decreased renal function, heart failure.  Volume status improving with excellent urine output.  Continues on torsemide 40 mg twice daily  3. Mitral valve stenosis: s/p repair. Surgery managing.  Anticoagulation with Coumadin  4. Anemia: Likely  multifactorial with acute illness contributing. Management per primary and transfuse prn  5. Hypokalemia:   Appears to have resolved  Will sign off patient today.  Renal function appears to be approaching baseline.   LOS: [place] @TODAY @11 :08 AM

## 2020-05-09 NOTE — Progress Notes (Signed)
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 She underwent mitral valve replacement and maze procedure on 04/29/2020.    PT Comments    Pt received in chair, agreeable and motivated to progress mobility and with good participation and tolerance for gait and stair training with min guard provided at most for transfers/gait/stairs. Pt with fair balance for short gait trial with no AD but improved stability using RW, increased work of breathing with exertion and needed brief standing breaks to recover x2, HR max 118 bpm. Pt continues to benefit from PT services to progress toward functional mobility goals. Continue to recommend HHPT.   Follow Up Recommendations  Home health PT;Supervision/Assistance - 24 hour     Equipment Recommendations  Rolling walker with 5" wheels    Recommendations for Other Services       Precautions / Restrictions Precautions Precautions: Fall;Sternal Precaution Comments: reviewed "move in the tube", correct way to get into and out of bed Restrictions Weight Bearing Restrictions: No Other Position/Activity Restrictions: sternal precs    Mobility  Bed Mobility               General bed mobility comments: visual/verbal review, pt preferring to remain up in chair    Transfers Overall transfer level: Needs assistance Equipment used: Rolling walker (2 wheeled) Transfers: Sit to/from Stand Sit to Stand: Min guard         General transfer comment: min guard for safety, cues for hand placement on knees to avoid stressing sternal incision, from chair height x2 reps  Ambulation/Gait Ambulation/Gait assistance: Supervision;Min guard Gait Distance (Feet): 150 Feet (20ft no AD and 156ft with RW) Assistive device:  Rolling walker (2 wheeled);None Gait Pattern/deviations: Step-through pattern;Decreased stride length;Trunk flexed Gait velocity: decr   General Gait Details: min guard for gait ~31ft no AD with increased postural sway but no LOB, VC for upright posture, standing rest breaks as needed. Pt requested RW for remainder of gait trial and only needs light UE reliance on RW. Standing rest breaks x2 during session, HRmax 118 bpm and SpO2 94-98% on RA with exertion   Stairs Stairs: Yes Stairs assistance: Min guard Stair Management: One rail Left;Forwards;Step to pattern Number of Stairs: 8 (4+4) General stair comments: cues for sequencing and activity pacing, no LOB, min guard for safety   Wheelchair Mobility    Modified Rankin (Stroke Patients Only)       Balance Overall balance assessment: Needs assistance Sitting-balance support: No upper extremity supported;Feet supported Sitting balance-Leahy Scale: Good     Standing balance support: Bilateral upper extremity supported;No upper extremity supported;During functional activity Standing balance-Leahy Scale: Good Standing balance comment: able to walk with no AD but reports less fatiguing with use of RW                            Cognition Arousal/Alertness: Awake/alert Behavior During Therapy: WFL for tasks assessed/performed Overall Cognitive Status: Within Functional Limits for tasks assessed                                        Exercises      General  Comments General comments (skin integrity, edema, etc.): VSS on RA      Pertinent Vitals/Pain Pain Assessment: 0-10 Faces Pain Scale: Hurts a little bit Pain Location: sternal incision Pain Descriptors / Indicators: Discomfort Pain Intervention(s): Monitored during session;Repositioned    Home Living                      Prior Function            PT Goals (current goals can now be found in the care plan section) Acute Rehab PT  Goals Patient Stated Goal: home PT Goal Formulation: With patient/family Time For Goal Achievement: 05/20/20 Potential to Achieve Goals: Good Progress towards PT goals: Progressing toward goals    Frequency    Min 3X/week      PT Plan Current plan remains appropriate    Co-evaluation              AM-PAC PT "6 Clicks" Mobility   Outcome Measure  Help needed turning from your back to your side while in a flat bed without using bedrails?: A Little Help needed moving from lying on your back to sitting on the side of a flat bed without using bedrails?: A Little Help needed moving to and from a bed to a chair (including a wheelchair)?: A Little Help needed standing up from a chair using your arms (e.g., wheelchair or bedside chair)?: A Little Help needed to walk in hospital room?: A Little Help needed climbing 3-5 steps with a railing? : A Little 6 Click Score: 18    End of Session Equipment Utilized During Treatment: Gait belt Activity Tolerance: Patient tolerated treatment well Patient left: in chair;with call bell/phone within reach Nurse Communication: Mobility status PT Visit Diagnosis: Difficulty in walking, not elsewhere classified (R26.2);Muscle weakness (generalized) (M62.81)     Time: 2542-7062 PT Time Calculation (min) (ACUTE ONLY): 17 min  Charges:  $Gait Training: 8-22 mins                     Emerlyn Mehlhoff P., PTA Acute Rehabilitation Services Pager: [phone] Office: [place] 05/09/2020, 6:12 PM

## 2020-05-09 NOTE — TOC Progression Note (Signed)
Transition of Care (TOC) - Progression Note  Valentina Gu, BSN Transitions of Care Unit 4E- RN Case Manager See Treatment Team for direct phone #    Patient Details  Name: Leslie [name] MRN: [id number] Date of Birth: 18-Jun-1965  Transition of Care [facility]) CM/SW Contact  Dahlia Client Romeo Rabon, RN Phone Number: 05/09/2020, 10:40 PM  Clinical Narrative:    Follow up done with pt for [facility] and DME needs. Pt has list for [facility] choice Per CMS guidelines from medicare.gov website with star ratings (copy placed in shadow chart), per pt she does not have a preference and defers to [facility] to secure and agency on her behalf.  Call made to [employer] for DME-RW need- RW has been delivered to the room for home.   Call made to [facility] for HHPT referral- referral has been accepted- they will also add RN for HF management follow up.    Expected Discharge Plan: [place] Barriers to Discharge: Continued Medical Work up  Expected Discharge Plan and Services Expected Discharge Plan: [place] In-house Referral: Clinical Social Work Discharge Planning Services: CM Consult Post Acute Care Choice: [place] Living arrangements for the past 2 months: Apartment                 DME Arranged: Gilford Rile rolling DME Agency: [employer] Date DME Agency Contacted: 05/09/20 Time DME Agency Contacted: 37 Representative spoke with at DME Agency: Freda Munro [place]: [place]: [place] ([place]) Date Economy: 05/09/20 Time HH Agency Contacted: 1500 Representative spoke with at [place]: [place] Determinants of Health (SDOH) Interventions Food Insecurity Interventions: Intervention Not Indicated Financial Strain Interventions: Intervention Not Indicated Housing Interventions: Intervention Not Indicated Transportation Interventions: Intervention Not Indicated  Readmission Risk  Interventions Readmission Risk Prevention Plan 05/08/2020 04/10/2019  Transportation Screening - Complete  PCP or Specialist Appt within 3-5 Days Complete -  Home Care Screening - Complete  Medication Review (RN CM) - Complete  Some recent data might be hidden

## 2020-05-09 NOTE — Progress Notes (Signed)
CARDIAC REHAB PHASE I   Ambulated with mobility tech and plans to walk with PT. D/c education completed with pt. Pt educated on importance of site care and monitoring incisions daily. Encouraged continued IS use, walks, and sternal precautions. Pt given in-the-tube sheet along with heart healthy and diabetic diets. Reviewed restrictions and exercise guidelines. Will refer to CRP II [place]. Pt helped to BR, BR call light within reach.   [phone] Rufina Falco, RN BSN 05/09/2020 2:23 PM

## 2020-05-10 LAB — GLUCOSE, CAPILLARY
Glucose-Capillary: 128 mg/dL — ABNORMAL HIGH (ref 70–99)
Glucose-Capillary: 121 mg/dL — ABNORMAL HIGH (ref 70–99)
Glucose-Capillary: 139 mg/dL — ABNORMAL HIGH (ref 70–99)
Glucose-Capillary: 143 mg/dL — ABNORMAL HIGH (ref 70–99)

## 2020-05-10 LAB — BASIC METABOLIC PANEL
Anion gap: 16 — ABNORMAL HIGH (ref 5–15)
BUN: 40 mg/dL — ABNORMAL HIGH (ref 6–20)
CO2: 30 mmol/L (ref 22–32)
Calcium: 9.2 mg/dL (ref 8.9–10.3)
Chloride: 88 mmol/L — ABNORMAL LOW (ref 98–111)
Creatinine, Ser: 1.76 mg/dL — ABNORMAL HIGH (ref 0.44–1.00)
GFR, Estimated: 34 mL/min — ABNORMAL LOW (ref >60)
Glucose, Bld: 97 mg/dL (ref 70–99)
Potassium: 2.5 mmol/L — CL (ref 3.5–5.1)
Sodium: 134 mmol/L — ABNORMAL LOW (ref 135–145)

## 2020-05-10 LAB — COOXEMETRY PANEL
Carboxyhemoglobin: 1.3 % (ref 0.5–1.5)
Methemoglobin: 1.1 % (ref 0.0–1.5)
O2 Saturation: 63.8 %
Total hemoglobin: 9.7 g/dL — ABNORMAL LOW (ref 12.0–16.0)

## 2020-05-10 LAB — PROTIME-INR
INR: 1.9 — ABNORMAL HIGH (ref 0.8–1.2)
Prothrombin Time: 22 seconds — ABNORMAL HIGH (ref 11.4–15.2)

## 2020-05-10 MED ORDER — AMLODIPINE BESYLATE 10 MG PO TABS
10.0000 mg | ORAL_TABLET | Freq: Every day | ORAL | Status: DC
  Administered 2020-05-10 – 2020-05-11 (×2): 10 mg via ORAL
  Filled 2020-05-10 (×2): qty 1

## 2020-05-10 MED ORDER — POTASSIUM CHLORIDE CRYS ER 20 MEQ PO TBCR
30.0000 meq | EXTENDED_RELEASE_TABLET | Freq: Four times a day (QID) | ORAL | Status: AC
  Administered 2020-05-10 (×3): 30 meq via ORAL
  Filled 2020-05-10 (×3): qty 1

## 2020-05-10 NOTE — Progress Notes (Signed)
IVT visit to pull the PICC line per PA order. Spoke with the primary RN and stated this pt has CVP's ordered at this time. RN to call MD to clarify PICC removal at this time. Instructed RN I will clear the consult, and when appropriate, put in a second consult. IVT will then remove the PICC line.

## 2020-05-10 NOTE — Progress Notes (Addendum)
[address]       [place] [address]             [phone]      11 Days Post-Op Procedure(s) (LRB): MITRAL VALVE (MV) REPLACEMENT USING ON-X VALVE SIZE 27/29MM (N/A) MAZE (N/A) TRANSESOPHAGEAL ECHOCARDIOGRAM (TEE) (N/A) CLIPPING OF ATRIAL APPENDAGE USING ATRICURE  PRO2 CLIP SIZE 45MM Subjective: Up in the bedside chair after walking in the hall this morning. Says she feels good, has no concerns.   CoOx 64 this am.   Objective: Vital signs in last 24 hours: Temp:  [97.9 F (36.6 C)-98.8 F (37.1 C)] 97.9 F (36.6 C) (04/23 0744) Pulse Rate:  [92-106] 106 (04/23 0744) Cardiac Rhythm: Heart block;Normal sinus rhythm (04/23 0700) Resp:  [18-20] 20 (04/23 0744) BP: (144-174)/(72-85) 174/84 (04/23 0744) SpO2:  [96 %-99 %] 96 % (04/23 0744) Weight:  [110.2 kg] 110.2 kg (04/23 0511)  Hemodynamic parameters for last 24 hours: CVP:  [15 mmHg-20 mmHg] 16 mmHg  Intake/Output from previous day: 04/22 0701 - 04/23 0700 In: 149 [P.O.:120; I.V.:29] Out: -  Intake/Output this shift: No intake/output data recorded.  General appearance: alert, cooperative and no distress Heart: SR with 1st degree AVB Lungs: CTA bilaterally Abdomen: benign Extremities: trace LE edema Wound: the sternal incision is healing well  Lab Results: Recent Labs    05/08/20 0250  WBC 12.9*  HGB 9.2*  HCT 27.8*  PLT 451*   BMET:  Recent Labs    05/08/20 0250 05/09/20 0522  NA 131* 133*  K 3.7 3.8  CL 95* 97*  CO2 26 27  GLUCOSE 134* 170*  BUN 58* 46*  CREATININE 2.24* 1.95*  CALCIUM 8.6* 8.6*    PT/INR:  Recent Labs    05/10/20 0328  LABPROT 22.0*  INR 1.9*   ABG    Component Value Date/Time   PHART 7.341 (L) 04/30/2020 2201   HCO3 22.9 04/30/2020 2201   TCO2 24 04/30/2020 2201   ACIDBASEDEF 3.0 (H) 04/30/2020 2201   O2SAT 63.8 05/10/2020 0331   CBG (last 3)  Recent Labs    05/09/20 1604 05/09/20 2117 05/10/20 0608  GLUCAP 116* 118* 128*     Meds Scheduled Meds: . amLODipine  5 mg Oral Daily  . atorvastatin  80 mg Oral Daily  . bisacodyl  10 mg Oral Daily   Or  . bisacodyl  10 mg Rectal Daily  . Chlorhexidine Gluconate Cloth  6 each Topical Daily  . docusate sodium  200 mg Oral Daily  . enoxaparin (LOVENOX) injection  30 mg Subcutaneous Q24H  . ferrous [id number] C-folic acid  1 capsule Oral Q breakfast  . insulin aspart  0-24 Units Subcutaneous TID WC  . mouth rinse  15 mL Mouth Rinse BID  . metoprolol tartrate  25 mg Oral BID  . pantoprazole  40 mg Oral BID AC  . polyethylene glycol  17 g Oral Daily  . sodium chloride flush  3 mL Intravenous Q12H  . torsemide  40 mg Oral BID  . warfarin  5 mg Oral q1600  . Warfarin - Physician Dosing Inpatient   Does not apply q1600   Continuous Infusions: . sodium chloride 250 mL (05/06/20 1140)  . lactated ringers     PRN Meds:.calcium carbonate, hydrALAZINE, ondansetron (ZOFRAN) IV, sodium chloride flush, traMADol  Xrays No results found.  Assessment/Plan: S/P Procedure(s) (LRB): MITRAL VALVE (MV) REPLACEMENT USING ON-X VALVE SIZE 27/29MM (N/A) MAZE (N/A) TRANSESOPHAGEAL ECHOCARDIOGRAM (TEE) (N/A)  CLIPPING OF ATRIAL APPENDAGE USING ATRICURE  PRO2 CLIP SIZE 45MM  1 afeb, VSS but BP remains elevated. CO-OX 64 , milrinone stopped yesterday. SR, ST, PVC's, on 50 BID metoprolol 2 sats 97% on RA 3 Renal; creat/BUN have been trending down, no new lab today.  Has good uop 4 INR 1.9, cont 5 mg for now 5 Nephrology and AHF cont to assist with management 6 Disposition: Plan for discharge when OK with HF team. May need to adjust meds for BP.    LOS: 11 days    Antony Odea PA-C Pager [phone] 05/10/2020     Chart reviewed, patient examined, agree with above. Plan home today if BMET stable. It is pending.

## 2020-05-10 NOTE — Progress Notes (Signed)
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 She underwent mitral valve replacement and maze procedure on 04/29/2020.    PT Comments    Pt tolerates treatment well, ambulating for increased distances and without reports of SOB. PT provides education on stair negotiation technique and pt demonstrates good knowledge of device management and sternal precautions at this time. Pt is encouraged to continue ambulating out of the room multiple times each day to improve activity tolerance and to restore independence. Acute PT will continue to follow to provide dynamic gait and balance challenges.   Follow Up Recommendations  Home health PT;Supervision/Assistance - 24 hour     Equipment Recommendations  Rolling walker with 5" wheels (RW present in room for D/C)    Recommendations for Other Services       Precautions / Restrictions Precautions Precautions: Fall;Sternal Precaution Comments: reviewed sternal precautions Restrictions Weight Bearing Restrictions: No Other Position/Activity Restrictions: sternal precs    Mobility  Bed Mobility               General bed mobility comments: not assessed, pt received sitting at edge of bed, left in same position    Transfers Overall transfer level: Needs assistance Equipment used: Rolling walker (2 wheeled) Transfers: Sit to/from Stand Sit to Stand: Supervision         General transfer comment: pt pushing from knees, good knowledge of sternal precautions  Ambulation/Gait Ambulation/Gait assistance: Modified independent (Device/Increase time) Gait Distance (Feet): 300 Feet Assistive device: Rolling walker (2 wheeled) Gait Pattern/deviations: Step-through pattern Gait velocity: reduced Gait  velocity interpretation: <1.8 ft/sec, indicate of risk for recurrent falls General Gait Details: pt with steady step-through gait, reduced gait speed   Stairs         General stair comments: PT provides education on stair negotiation technique, ascending stairs sideways with BUE support of railing to better maintain sternal precautions   Wheelchair Mobility    Modified Rankin (Stroke Patients Only)       Balance Overall balance assessment: Needs assistance Sitting-balance support: No upper extremity supported;Feet supported Sitting balance-Leahy Scale: Good     Standing balance support: No upper extremity supported Standing balance-Leahy Scale: Fair                              Cognition Arousal/Alertness: Awake/alert Behavior During Therapy: WFL for tasks assessed/performed Overall Cognitive Status: Within Functional Limits for tasks assessed                                        Exercises      General Comments General comments (skin integrity, edema, etc.): VSS on RA      Pertinent Vitals/Pain Pain Assessment: No/denies pain    Home Living                      Prior Function            PT Goals (current goals can now be found in the care plan section) Acute Rehab PT Goals Patient Stated Goal: home Progress towards PT goals: Progressing toward goals    Frequency  Min 3X/week      PT Plan Current plan remains appropriate    Co-evaluation              AM-PAC PT "6 Clicks" Mobility   Outcome Measure  Help needed turning from your back to your side while in a flat bed without using bedrails?: A Little Help needed moving from lying on your back to sitting on the side of a flat bed without using bedrails?: A Little Help needed moving to and from a bed to a chair (including a wheelchair)?: A Little Help needed standing up from a chair using your arms (e.g., wheelchair or bedside chair)?: A Little Help  needed to walk in hospital room?: A Little Help needed climbing 3-5 steps with a railing? : A Little 6 Click Score: 18    End of Session   Activity Tolerance: Patient tolerated treatment well Patient left: in bed;with call bell/phone within reach;with family/visitor present Nurse Communication: Mobility status PT Visit Diagnosis: Difficulty in walking, not elsewhere classified (R26.2);Muscle weakness (generalized) (M62.81)     Time: 0086-7619 PT Time Calculation (min) (ACUTE ONLY): 11 min  Charges:  $Therapeutic Activity: 8-22 mins                     Zenaida Niece, PT, DPT Acute Rehabilitation Pager: [phone]    Zenaida Niece 05/10/2020, 1:36 PM

## 2020-05-10 NOTE — Progress Notes (Signed)
Chest tube sutures removed, per order. Benzoin and steri-strips applied. Left site with scant drainage.

## 2020-05-10 NOTE — Progress Notes (Signed)
CARDIAC REHAB PHASE I   PRE:  Rate/Rhythm: 103/ST  BP:  Sitting: 148/88             SaO2: 96%  MODE:  Ambulation: 250 ft (HR 120/SaO2 = 98%)  POST:  Rate/Rhythm: ST/113  BP:  Sitting: 182/91      SaO2: 98%  Pt received OOB to chair. Pt agrees to ambulate. Voices feeling well today. Pt ambulated with steady gait using rolling walker. Pt back to chair with brake on and call bell at side. I/S = 750 mL. I/S encouraged. Reviewed discharge education.   Lesly Rubenstein, MS, ACSM CEP, CCRP  08:33 -9:05 am

## 2020-05-10 NOTE — Progress Notes (Signed)
Patient ID: Leslie [name], female   DOB: [birth year], 55 y.o.   MRN: [id number]     Advanced Heart Failure Rounding Note  PCP-Cardiologist: Ida Rogue, MD   Subjective:    Echo 4/15 EF 60-65% MVR ok.   Creatinine coming down 2.4>2.2>1.9>P  Off milrinone, co-ox 64%.  Got IV Lasix + metolazone yesterday, CVP down to 9 today and weight down 7 lbs (I/Os not recorded).  BP remains elevated.    Objective:   Weight Range: 110.2 kg Body mass index is 41.69 kg/m.   Vital Signs:   Temp:  [97.9 F (36.6 C)-98.8 F (37.1 C)] 97.9 F (36.6 C) (04/23 0744) Pulse Rate:  [92-106] 106 (04/23 0744) Resp:  [18-20] 20 (04/23 0744) BP: (144-174)/(72-85) 174/84 (04/23 0744) SpO2:  [96 %-99 %] 96 % (04/23 0744) Weight:  [110.2 kg] 110.2 kg (04/23 0511) Last BM Date: 05/09/20  Weight change: Filed Weights   05/09/20 0333 05/09/20 0441 05/10/20 0511  Weight: 113.3 kg 113.1 kg 110.2 kg    Intake/Output:   Intake/Output Summary (Last 24 hours) at 05/10/2020 1019 Last data filed at 05/09/2020 2000 Gross per 24 hour  Intake 149.02 ml  Output --  Net 149.02 ml      Physical Exam   General: NAD Neck: JVP 8 cm, no thyromegaly or thyroid nodule.  Lungs: Clear to auscultation bilaterally with normal respiratory effort. CV: Nondisplaced PMI.  Heart regular S1/S2 with mechanical S1, no S3/S4, no murmur.  No peripheral edema.  Abdomen: Soft, nontender, no hepatosplenomegaly, no distention.  Skin: Intact without lesions or rashes.  Neurologic: Alert and oriented x 3.  Psych: Normal affect. Extremities: No clubbing or cyanosis.  HEENT: Normal.    Telemetry   NSR 90s (personally reviewed)  Labs    CBC Recent Labs    05/08/20 0250  WBC 12.9*  HGB 9.2*  HCT 27.8*  MCV 88.8  PLT 086*   Basic Metabolic Panel Recent Labs    05/08/20 0250 05/09/20 0522  NA 131* 133*  K 3.7 3.8  CL 95* 97*  CO2 26 27  GLUCOSE 134* 170*  BUN 58* 46*  CREATININE 2.24* 1.95*  CALCIUM 8.6*  8.6*  MG 2.0  --   PHOS 3.0  --    Liver Function Tests Recent Labs    05/08/20 0250  ALBUMIN 2.8*   No results for input(s): LIPASE, AMYLASE in the last 72 hours. Cardiac Enzymes No results for input(s): CKTOTAL, CKMB, CKMBINDEX, TROPONINI in the last 72 hours.  BNP: BNP (last 3 results) No results for input(s): BNP in the last 8760 hours.  ProBNP (last 3 results) No results for input(s): PROBNP in the last 8760 hours.   D-Dimer No results for input(s): DDIMER in the last 72 hours. Hemoglobin A1C No results for input(s): HGBA1C in the last 72 hours. Fasting Lipid Panel No results for input(s): CHOL, HDL, LDLCALC, TRIG, CHOLHDL, LDLDIRECT in the last 72 hours. Thyroid Function Tests No results for input(s): TSH, T4TOTAL, T3FREE, THYROIDAB in the last 72 hours.  Invalid input(s): FREET3  Other results:   Imaging    No results found.   Medications:     Scheduled Medications: . amLODipine  10 mg Oral Daily  . atorvastatin  80 mg Oral Daily  . bisacodyl  10 mg Oral Daily   Or  . bisacodyl  10 mg Rectal Daily  . Chlorhexidine Gluconate Cloth  6 each Topical Daily  . docusate sodium  200 mg Oral Daily  .  enoxaparin (LOVENOX) injection  30 mg Subcutaneous Q24H  . ferrous [id number] C-folic acid  1 capsule Oral Q breakfast  . insulin aspart  0-24 Units Subcutaneous TID WC  . mouth rinse  15 mL Mouth Rinse BID  . metoprolol tartrate  25 mg Oral BID  . pantoprazole  40 mg Oral BID AC  . polyethylene glycol  17 g Oral Daily  . sodium chloride flush  3 mL Intravenous Q12H  . torsemide  40 mg Oral BID  . warfarin  5 mg Oral q1600  . Warfarin - Physician Dosing Inpatient   Does not apply q1600    Infusions: . sodium chloride 250 mL (05/06/20 1140)  . lactated ringers      PRN Medications: calcium carbonate, hydrALAZINE, ondansetron (ZOFRAN) IV, sodium chloride flush, traMADol    Patient Profile   55 y/o woman with morbid obesity, HTN, PAF, OSA  and rheumtic heart disease with severe MS. Underwent mechanical MVR and maze on 4/12. Post op course c/b pulmonary edema requiring BIPAP + nonoliguric AKI.   Assessment/Plan   1. Severe Mitral Valve Stenosis - 2/2 rheumatic heart disease  - s/p mechanical MVR 4/12 - on warfarin/ASA, INR 1.9.  (Per TCTS)  2. Acute CHF/Pulmonary Edema>>Cardiogenic Shock  - perioperative TEE EF 60-65% with severe mitral stenosis. - developed acute pulmonary edema post op + AKI - initial Co-ox 43%>>started on milrinone + Dopamine  - Echo 4/15 EF 60-65% MVR ok  - Co-ox 64%, now off milrinone. - On torsemide 40 bid. CVP 9 today, continue current diuretic.    3. AKI  - Creatinine peaked at 3.8, today 2.24 -> 1.95 -> pending today.  - suspect post-op ATN - Needs BMET sent stat today.   4. Acute Hypoxemic Respiratory Failure - due to pulmonary edema - resolved  5. PAF - s/p MAZE at time of MVR  - remains in NSR - INR 1.9 plan lifelong warfarin given mechanical MV   6. Hypokalemia - Pending BMET.   7. Hyponatremia - Pending BMET.   8. Anemia - Transfused 2 units RBCs a few days ago - Hgb 7.8>>9.5 >9.2   9. HTN - BP remains high.  - No ACE/ARB with AKI - Will increase amlodipine to 10 mg daily.   If BMET stable, think she can go home today.    Length of Stay: 42  Loralie Champagne, MD  05/10/2020, 10:19 AM  Advanced Heart Failure Team Pager [phone] (M-F; 7a - 5p)  Please contact [facility] for night-coverage after hours (5p -7a ) and weekends on [email or web address]

## 2020-05-10 NOTE — Progress Notes (Signed)
Mobility Specialist: Progress Note   05/10/20 1117  Mobility  Activity Ambulated in hall  Level of Assistance Independent after set-up  Assistive Device Front wheel walker  Distance Ambulated (ft) 470 ft  Mobility Response Tolerated well  Mobility performed by Mobility specialist  Bed Position Chair  $Mobility charge 1 Mobility   Pre-Mobility: 101 HR, 97% SpO2 Post-Mobility: 108 HR, 165/88 BP, 97% SpO2  Pt c/o feeling some SOB during ambulation but otherwise asx. Pt ws able to ambulate the entire hallway w/o any breaks today. Encouraged continued IS use and at least one more walk today. Pt to chair after walk and is ordering lunch.   [facility] Wing Gfeller Mobility Specialist Mobility Specialist Phone: [phone]

## 2020-05-11 LAB — BASIC METABOLIC PANEL
Anion gap: 12 (ref 5–15)
Anion gap: 13 (ref 5–15)
BUN: 39 mg/dL — ABNORMAL HIGH (ref 6–20)
BUN: 35 mg/dL — ABNORMAL HIGH (ref 6–20)
CO2: 33 mmol/L — ABNORMAL HIGH (ref 22–32)
CO2: 31 mmol/L (ref 22–32)
Calcium: 8.9 mg/dL (ref 8.9–10.3)
Calcium: 8.9 mg/dL (ref 8.9–10.3)
Chloride: 89 mmol/L — ABNORMAL LOW (ref 98–111)
Chloride: 91 mmol/L — ABNORMAL LOW (ref 98–111)
Creatinine, Ser: 1.8 mg/dL — ABNORMAL HIGH (ref 0.44–1.00)
Creatinine, Ser: 1.62 mg/dL — ABNORMAL HIGH (ref 0.44–1.00)
GFR, Estimated: 33 mL/min — ABNORMAL LOW (ref >60)
GFR, Estimated: 38 mL/min — ABNORMAL LOW (ref >60)
Glucose, Bld: 109 mg/dL — ABNORMAL HIGH (ref 70–99)
Glucose, Bld: 139 mg/dL — ABNORMAL HIGH (ref 70–99)
Potassium: 2.8 mmol/L — ABNORMAL LOW (ref 3.5–5.1)
Potassium: 2.8 mmol/L — ABNORMAL LOW (ref 3.5–5.1)
Sodium: 134 mmol/L — ABNORMAL LOW (ref 135–145)
Sodium: 135 mmol/L (ref 135–145)

## 2020-05-11 LAB — PROTIME-INR
INR: 2 — ABNORMAL HIGH (ref 0.8–1.2)
Prothrombin Time: 23 seconds — ABNORMAL HIGH (ref 11.4–15.2)

## 2020-05-11 LAB — GLUCOSE, CAPILLARY
Glucose-Capillary: 123 mg/dL — ABNORMAL HIGH (ref 70–99)
Glucose-Capillary: 151 mg/dL — ABNORMAL HIGH (ref 70–99)

## 2020-05-11 MED ORDER — POTASSIUM CHLORIDE CRYS ER 20 MEQ PO TBCR: 40.0000 meq | EXTENDED_RELEASE_TABLET | Freq: Every day | ORAL | 0 refills | Status: DC

## 2020-05-11 MED ORDER — SPIRONOLACTONE 25 MG PO TABS: 12.5000 mg | ORAL_TABLET | Freq: Every day | ORAL | 2 refills | Status: DC

## 2020-05-11 MED ORDER — AMLODIPINE BESYLATE 10 MG PO TABS: 10.0000 mg | ORAL_TABLET | Freq: Every day | ORAL | 2 refills | Status: DC

## 2020-05-11 MED ORDER — SPIRONOLACTONE 12.5 MG HALF TABLET
12.5000 mg | ORAL_TABLET | Freq: Every day | ORAL | Status: DC
  Administered 2020-05-11: 12.5 mg via ORAL
  Filled 2020-05-11: qty 1

## 2020-05-11 MED ORDER — POTASSIUM CHLORIDE CRYS ER 20 MEQ PO TBCR
40.0000 meq | EXTENDED_RELEASE_TABLET | Freq: Once | ORAL | Status: AC
  Administered 2020-05-11: 40 meq via ORAL
  Filled 2020-05-11: qty 2

## 2020-05-11 MED ORDER — WARFARIN SODIUM 5 MG PO TABS: 5.0000 mg | ORAL_TABLET | Freq: Every day | ORAL | 4 refills | Status: DC

## 2020-05-11 MED ORDER — POTASSIUM CHLORIDE CRYS ER 20 MEQ PO TBCR
40.0000 meq | EXTENDED_RELEASE_TABLET | Freq: Every day | ORAL | Status: DC
  Administered 2020-05-11: 40 meq via ORAL
  Filled 2020-05-11: qty 2

## 2020-05-11 MED ORDER — ASPIRIN EC 81 MG PO TBEC: 81.0000 mg | DELAYED_RELEASE_TABLET | Freq: Every day | ORAL | 2 refills | Status: AC

## 2020-05-11 NOTE — Progress Notes (Signed)
Mobility Specialist: Progress Note   05/11/20 1144  Mobility  Activity Ambulated in hall  Level of Assistance Independent after set-up  Assistive Device Front wheel walker  Distance Ambulated (ft) 470 ft  Mobility Response Tolerated well  Mobility performed by Mobility specialist  Bed Position Chair  $Mobility charge 1 Mobility   Pre-Mobility: 107 HR Post-Mobility: 124 HR, 98% SpO2  Pt said her legs felt heavy during ambulation but otherwise asx. Pt sitting in chair to eat lunch with call bell at her side and family member in room. Encouraged pt to continue walks today.   [facility] Leslie [name] Mobility Specialist Mobility Specialist Phone: [phone]

## 2020-05-11 NOTE — Progress Notes (Addendum)
[address]       [place] [address]             [phone]      12 Days Post-Op Procedure(s) (LRB): MITRAL VALVE (MV) REPLACEMENT USING ON-X VALVE SIZE 27/29MM (N/A) MAZE (N/A) TRANSESOPHAGEAL ECHOCARDIOGRAM (TEE) (N/A) CLIPPING OF ATRIAL APPENDAGE USING ATRICURE  PRO2 CLIP SIZE 45MM Subjective:  Discharge delayed yesterday to correct hypokalemia.  Says she had a good day and feels good, has no concerns.     Objective: Vital signs in last 24 hours: Temp:  [98.1 F (36.7 C)-98.7 F (37.1 C)] 98.1 F (36.7 C) (04/24 0801) Pulse Rate:  [76-103] 76 (04/24 0801) Cardiac Rhythm: Normal sinus rhythm;Heart block (04/23 2100) Resp:  [14-20] 17 (04/24 0801) BP: (148-165)/(79-91) 159/79 (04/24 0801) SpO2:  [95 %-98 %] 98 % (04/24 0349) Weight:  [107.9 kg] 107.9 kg (04/24 0349)  Hemodynamic parameters for last 24 hours: CVP:  [10 mmHg-14 mmHg] 11 mmHg  Intake/Output from previous day: 04/23 0701 - 04/24 0700 In: 610 [P.O.:600; I.V.:10] Out: -  Intake/Output this shift: No intake/output data recorded.  General appearance: alert,and no distress Heart: SR with 1st degree AVB (this has been stable) Lungs: CTA bilaterally Abdomen: benign Extremities: trace LE edema Wound: the sternal incision is healing well  Lab Results: No results for input(s): WBC, HGB, HCT, PLT in the last 72 hours. BMET:  Recent Labs    05/10/20 1016 05/11/20 0404  NA 134* 134*  K 2.5* 2.8*  CL 88* 89*  CO2 30 33*  GLUCOSE 97 109*  BUN 40* 39*  CREATININE 1.76* 1.80*  CALCIUM 9.2 8.9    PT/INR:  Recent Labs    05/11/20 0404  LABPROT 23.0*  INR 2.0*   ABG    Component Value Date/Time   PHART 7.341 (L) 04/30/2020 2201   HCO3 22.9 04/30/2020 2201   TCO2 24 04/30/2020 2201   ACIDBASEDEF 3.0 (H) 04/30/2020 2201   O2SAT 63.8 05/10/2020 0331   CBG (last 3)  Recent Labs    05/10/20 1634 05/10/20 2121 05/11/20 0621  GLUCAP 139* 143* 123*     Meds Scheduled Meds: . amLODipine  10 mg Oral Daily  . atorvastatin  80 mg Oral Daily  . bisacodyl  10 mg Oral Daily   Or  . bisacodyl  10 mg Rectal Daily  . Chlorhexidine Gluconate Cloth  6 each Topical Daily  . docusate sodium  200 mg Oral Daily  . enoxaparin (LOVENOX) injection  30 mg Subcutaneous Q24H  . ferrous [id number] C-folic acid  1 capsule Oral Q breakfast  . insulin aspart  0-24 Units Subcutaneous TID WC  . mouth rinse  15 mL Mouth Rinse BID  . metoprolol tartrate  25 mg Oral BID  . pantoprazole  40 mg Oral BID AC  . polyethylene glycol  17 g Oral Daily  . potassium chloride  40 mEq Oral Once  . sodium chloride flush  3 mL Intravenous Q12H  . torsemide  40 mg Oral BID  . warfarin  5 mg Oral q1600  . Warfarin - Physician Dosing Inpatient   Does not apply q1600   Continuous Infusions: . sodium chloride 250 mL (05/06/20 1140)  . lactated ringers     PRN Meds:.calcium carbonate, hydrALAZINE, ondansetron (ZOFRAN) IV, sodium chloride flush, traMADol  Xrays No results found.  Assessment/Plan: S/P Procedure(s) (LRB): MITRAL VALVE (MV) REPLACEMENT USING ON-X VALVE SIZE 27/29MM (N/A) MAZE (N/A) TRANSESOPHAGEAL ECHOCARDIOGRAM (TEE) (N/A)  CLIPPING OF ATRIAL APPENDAGE USING ATRICURE  PRO2 CLIP SIZE 45MM  1 afeb, VSS but BP remains elevated. Milrinone off for 48hrs. ,on 50 BID metoprolol 2 sats 98% on RA 3 Renal; creat/BUN reasonably stable.  Has good uop 4 INR 2.0, cont 5 mg Coumadin at discharge 5  Hypokalemia- K+ 2.5 yesterday -->2.8 this morning after total 72mEq replacement     yesterday. Cardiac rhythm has been stable. Giving another 56mEq this am.  6 Disposition: Plan for discharge later today if HF team agrees.    LOS: 12 days    Antony Odea PA-C Pager [phone] 05/11/2020    Chart reviewed, patient examined, agree with above. Plan to send home today on 40 KCL daily.

## 2020-05-11 NOTE — Progress Notes (Signed)
Patient ID: Leslie [name], female   DOB: [birth year], 55 y.o.   MRN: [id number]     Advanced Heart Failure Rounding Note  PCP-Cardiologist: Ida Rogue, MD   Subjective:    Echo 4/15 EF 60-65% MVR ok.   Creatinine coming down 2.4>2.2>1.9>1.8.   Weight down 5 lbs.  CVP not hooked up.  No complaints.  K still low at 2.8 this morning.  SBP 150s.   Objective:   Weight Range: 107.9 kg Body mass index is 40.84 kg/m.   Vital Signs:   Temp:  [98.1 F (36.7 C)-98.7 F (37.1 C)] 98.1 F (36.7 C) (04/24 0801) Pulse Rate:  [76-103] 76 (04/24 0801) Resp:  [14-20] 17 (04/24 0801) BP: (148-165)/(79-91) 159/79 (04/24 0801) SpO2:  [95 %-98 %] 98 % (04/24 0349) Weight:  [107.9 kg] 107.9 kg (04/24 0349) Last BM Date: 05/10/20  Weight change: Filed Weights   05/09/20 0441 05/10/20 0511 05/11/20 0349  Weight: 113.1 kg 110.2 kg 107.9 kg    Intake/Output:   Intake/Output Summary (Last 24 hours) at 05/11/2020 1040 Last data filed at 05/10/2020 2100 Gross per 24 hour  Intake 360 ml  Output --  Net 360 ml      Physical Exam   General: NAD Neck: JVP 8-9 cm, no thyromegaly or thyroid nodule.  Lungs: Clear to auscultation bilaterally with normal respiratory effort. CV: Nondisplaced PMI.  Heart regular S1/S2 with mechanical S1, no S3/S4, no murmur.  No peripheral edema.   Abdomen: Soft, nontender, no hepatosplenomegaly, no distention.  Skin: Intact without lesions or rashes.  Neurologic: Alert and oriented x 3.  Psych: Normal affect. Extremities: No clubbing or cyanosis.  HEENT: Normal.    Telemetry   NSR 90s (personally reviewed)  Labs    CBC No results for input(s): WBC, NEUTROABS, HGB, HCT, MCV, PLT in the last 72 hours. Basic Metabolic Panel Recent Labs    05/10/20 1016 05/11/20 0404  NA 134* 134*  K 2.5* 2.8*  CL 88* 89*  CO2 30 33*  GLUCOSE 97 109*  BUN 40* 39*  CREATININE 1.76* 1.80*  CALCIUM 9.2 8.9   Liver Function Tests No results for input(s): AST,  ALT, ALKPHOS, BILITOT, PROT, ALBUMIN in the last 72 hours. No results for input(s): LIPASE, AMYLASE in the last 72 hours. Cardiac Enzymes No results for input(s): CKTOTAL, CKMB, CKMBINDEX, TROPONINI in the last 72 hours.  BNP: BNP (last 3 results) No results for input(s): BNP in the last 8760 hours.  ProBNP (last 3 results) No results for input(s): PROBNP in the last 8760 hours.   D-Dimer No results for input(s): DDIMER in the last 72 hours. Hemoglobin A1C No results for input(s): HGBA1C in the last 72 hours. Fasting Lipid Panel No results for input(s): CHOL, HDL, LDLCALC, TRIG, CHOLHDL, LDLDIRECT in the last 72 hours. Thyroid Function Tests No results for input(s): TSH, T4TOTAL, T3FREE, THYROIDAB in the last 72 hours.  Invalid input(s): FREET3  Other results:   Imaging    No results found.   Medications:     Scheduled Medications: . amLODipine  10 mg Oral Daily  . atorvastatin  80 mg Oral Daily  . bisacodyl  10 mg Oral Daily   Or  . bisacodyl  10 mg Rectal Daily  . Chlorhexidine Gluconate Cloth  6 each Topical Daily  . docusate sodium  200 mg Oral Daily  . enoxaparin (LOVENOX) injection  30 mg Subcutaneous Q24H  . ferrous [id number] C-folic acid  1 capsule Oral Q breakfast  .  insulin aspart  0-24 Units Subcutaneous TID WC  . mouth rinse  15 mL Mouth Rinse BID  . metoprolol tartrate  25 mg Oral BID  . pantoprazole  40 mg Oral BID AC  . polyethylene glycol  17 g Oral Daily  . potassium chloride  40 mEq Oral Once  . potassium chloride  40 mEq Oral Daily  . sodium chloride flush  3 mL Intravenous Q12H  . spironolactone  12.5 mg Oral Daily  . torsemide  40 mg Oral BID  . warfarin  5 mg Oral q1600  . Warfarin - Physician Dosing Inpatient   Does not apply q1600    Infusions: . sodium chloride 250 mL (05/06/20 1140)  . lactated ringers      PRN Medications: calcium carbonate, hydrALAZINE, ondansetron (ZOFRAN) IV, sodium chloride flush,  traMADol    Patient Profile   55 y/o woman with morbid obesity, HTN, PAF, OSA and rheumtic heart disease with severe MS. Underwent mechanical MVR and maze on 4/12. Post op course c/b pulmonary edema requiring BIPAP + nonoliguric AKI.   Assessment/Plan   1. Severe Mitral Valve Stenosis - 2/2 rheumatic heart disease  - s/p mechanical MVR 4/12 - on warfarin/ASA, INR 2.0 (goal will be 2.5-3.5).  (Per TCTS)  2. Acute CHF/Pulmonary Edema>>Cardiogenic Shock  - perioperative TEE EF 60-65% with severe mitral stenosis. - developed acute pulmonary edema post op + AKI - initial Co-ox 43%>>started on milrinone + Dopamine  - Echo 4/15 EF 60-65% MVR ok  - Co-ox 64% yesterday off milrinone. - On torsemide 40 bid.  Volume status looks ok and weight down.  Continue current torsemide.     3. AKI  - Creatinine peaked at 3.8, today 2.24 -> 1.95 -> 1.8.   - suspect post-op ATN  4. Acute Hypoxemic Respiratory Failure - due to pulmonary edema - resolved  5. PAF - s/p MAZE at time of MVR  - remains in NSR - INR 1.9 plan lifelong warfarin given mechanical MV   6. Hypokalemia - Pending BMET.   7. Hyponatremia - Pending BMET.   8. Anemia - Transfused 2 units RBCs a few days ago - Hgb 7.8>>9.5 >9.2   9. HTN - BP remains high.  - No ACE/ARB with AKI - Continue amlodipine 10 mg daily.  - With hypokalemia, add spironolactone 12.5 daily.  10. Hypokalemia - K 2.8 today.  - She got 40 mEq KCl already, will give another 40 mEq now.   - Start spironolactone 12.5 daily.  - Repeat BMET at 1 pm to make sure K is optimized prior to discharge.    Plan for home today after afternoon BMET (make sure she does not need additional K).  Needs close followup in CHF clinic, coumadin clinic followup, and BMET later this week.  Cardiac meds for home: KCl 40 daily, spironolactone 12.5 daily, warfarin, ASA 81 daily, amlodipine 10 daily, torsemide 40 bid, metoprolol 25 bid.     Length of Stay: 68  Loralie Champagne, MD  05/11/2020, 10:40 AM  Advanced Heart Failure Team Pager [phone] (M-F; 7a - 5p)  Please contact [facility] for night-coverage after hours (5p -7a ) and weekends on [email or web address]

## 2020-05-12 ENCOUNTER — Other Ambulatory Visit: Payer: Self-pay | Admitting: Surgical

## 2020-05-12 ENCOUNTER — Ambulatory Visit (INDEPENDENT_AMBULATORY_CARE_PROVIDER_SITE_OTHER): Payer: 59 | Admitting: Physician Assistant

## 2020-05-12 ENCOUNTER — Encounter: Payer: Self-pay | Admitting: Physician Assistant

## 2020-05-12 ENCOUNTER — Other Ambulatory Visit: Payer: Self-pay

## 2020-05-12 DIAGNOSIS — I5032 Chronic diastolic (congestive) heart failure: Secondary | ICD-10-CM | POA: Diagnosis not present

## 2020-05-12 DIAGNOSIS — D500 Iron deficiency anemia secondary to blood loss (chronic): Secondary | ICD-10-CM

## 2020-05-12 DIAGNOSIS — Z9889 Other specified postprocedural states: Secondary | ICD-10-CM

## 2020-05-12 DIAGNOSIS — Z954 Presence of other heart-valve replacement: Secondary | ICD-10-CM | POA: Diagnosis not present

## 2020-05-12 DIAGNOSIS — N1832 Chronic kidney disease, stage 3b: Secondary | ICD-10-CM

## 2020-05-12 DIAGNOSIS — Z09 Encounter for follow-up examination after completed treatment for conditions other than malignant neoplasm: Secondary | ICD-10-CM

## 2020-05-12 DIAGNOSIS — I480 Paroxysmal atrial fibrillation: Secondary | ICD-10-CM

## 2020-05-12 DIAGNOSIS — Z8679 Personal history of other diseases of the circulatory system: Secondary | ICD-10-CM

## 2020-05-12 NOTE — Progress Notes (Signed)
[facility] [place],  [address]  Internal MEDICINE  Office Visit Note  Patient Name: Leslie [name]  [id number]  [id number]  Date of Service: 05/12/2020     Chief Complaint  Patient presents with  . Hospitalization Follow-up    Severe mitral stenosis/ congestive heart failure, med review     HPI Pt is here for recent hospital follow up. Martin Majestic to the hospital for planned mitral valve replacement. Everything went well. Patient still feels a little groggy. Does not feel SOB most of the time. She will walk up and down her drive way 3x day. She is supposed to be receiving home health therapy but has not heard from them yet--this was ordered before discharge. She does have some numbness along ulnar side of right upper extremity since procedure. Had IV removed yesterday. Will continue to monitor. -She has appt established with cardiology but is unsure which day  -She does not recall which meds were changed but per record she is taking amlodipine, 98m ASA, atorvastatin, metoprolol BID, spironolactone, torsemide BID, tramadol and warfarin. She has an INR check on the 27th. Pt states she never received the tramadol and will check with the pharmacy since tylenol does not do much for her pain and just needs something to take the edge off. -she does state she has put on 2 pounds but denies any increased fluid and ankle swelling. -2 of her surgical sites had steri strips fall off after discharge yesterday and appear to be widening with some fluid leaking now. Advised to call surgeon's office since she would likely need to get new strips placed for better healing.  Current Medication: Outpatient Encounter Medications as of 05/12/2020  Medication Sig Note  . acetaminophen (TYLENOL) 500 MG tablet Take 1,000 mg by mouth every 6 (six) hours as needed for moderate pain.   .Marland Kitchenalbuterol (VENTOLIN HFA) 108 (90 Base) MCG/ACT inhaler Inhale 2 puffs into the lungs every 6 (six)  hours as needed for wheezing or shortness of breath.   .Marland KitchenamLODipine (NORVASC) 10 MG tablet Take 1 tablet (10 mg total) by mouth daily.   .Marland Kitchenamoxicillin (AMOXIL) 500 MG tablet Take 2,000 mg by mouth See admin instructions. Take 4 capsules (2000 mg) by mouth 1 hour prior to dental work 04/16/2020: Dental Procedure  . aspirin EC 81 MG tablet Take 1 tablet (81 mg total) by mouth daily. Swallow whole.   .Marland Kitchenatorvastatin (LIPITOR) 80 MG tablet Take 1 tablet (80 mg total) by mouth every evening.   . calcium carbonate (TUMS - DOSED IN MG ELEMENTAL CALCIUM) 500 MG chewable tablet Chew 500 mg by mouth daily as needed for indigestion or heartburn.   . ferrous [id number]-folic acid (TRINSICON / FOLTRIN) capsule Take 1 capsule by mouth 2 (two) times daily after a meal.   . fluticasone (FLONASE) 50 MCG/ACT nasal spray Place 1 spray into both nostrils daily as needed for rhinitis.   . metoprolol tartrate (LOPRESSOR) 25 MG tablet Take 1 tablet (25 mg total) by mouth 2 (two) times daily.   . potassium chloride SA (KLOR-CON) 20 MEQ tablet Take 2 tablets (40 mEq total) by mouth daily.   .Marland Kitchenspironolactone (ALDACTONE) 25 MG tablet Take 0.5 tablets (12.5 mg total) by mouth daily.   .Marland Kitchentorsemide (DEMADEX) 20 MG tablet TAKE 2 TABLETS(40 MG) BY MOUTH TWICE DAILY (Patient taking differently: Take 40 mg by mouth in the morning and at bedtime.)   . traMADol (ULTRAM) 50 MG tablet Take  1 tablet (50 mg total) by mouth every 6 (six) hours as needed for up to 7 days for moderate pain.   Marland Kitchen warfarin (COUMADIN) 5 MG tablet Take 1 tablet (5 mg total) by mouth daily at 4 PM. As instructed by Coumadin clinic for lifetime    No facility-administered encounter medications on file as of 05/12/2020.    Surgical History: Past Surgical History:  Procedure Laterality Date  . ABDOMINAL HYSTERECTOMY     total  . ABDOMINAL HYSTERECTOMY    . BREAST BIOPSY Bilateral 2012  . BREAST BIOPSY Right 08-07-12   fibroadenomatous changes and  columnar cells  . BREAST BIOPSY  02/03/2015   stereo byrnett  . BUBBLE STUDY  04/01/2020   Procedure: BUBBLE STUDY;  Surgeon: [name], MD;  Location: [place];  Service: Cardiovascular;;  . CARDIAC CATHETERIZATION    . CHOLECYSTECTOMY N/A 02/27/2016   Procedure: LAPAROSCOPIC CHOLECYSTECTOMY;  Surgeon: Christene Lye, MD;  Location: [facility] ORS;  Service: General;  Laterality: N/A;  . CLIPPING OF ATRIAL APPENDAGE  04/29/2020   Procedure: CLIPPING OF ATRIAL APPENDAGE USING ATRICURE  PRO2 CLIP SIZE 45MM;  Surgeon: Rexene Alberts, MD;  Location: [facility] OR;  Service: Open Heart Surgery;;  . COLONOSCOPY WITH PROPOFOL N/A 09/26/2018   Procedure: COLONOSCOPY WITH PROPOFOL;  Surgeon: Lucilla Lame, MD;  Location[facility] ENDOSCOPY;  Service: Endoscopy;  Laterality: N/A;  . CYSTOSCOPY W/ RETROGRADES Right 08/18/2018   Procedure: CYSTOSCOPY WITH RETROGRADE PYELOGRAM;  Surgeon: Billey Co, MD;  Location: [facility];  Service: Urology;  Laterality: Right;  . CYSTOSCOPY/URETEROSCOPY/HOLMIUM LASER/STENT PLACEMENT Right 08/18/2018   Procedure: CYSTOSCOPY/URETEROSCOPY/STENT PLACEMENT;  Surgeon: Billey Co, MD;  Location: [facility];  Service: Urology;  Laterality: Right;  . DIAGNOSTIC LAPAROSCOPY    . ESOPHAGOGASTRODUODENOSCOPY (EGD) WITH PROPOFOL N/A 09/26/2018   Procedure: ESOPHAGOGASTRODUODENOSCOPY (EGD) WITH PROPOFOL;  Surgeon: Lucilla Lame, MD;  Location: [facility] ENDOSCOPY;  Service: Endoscopy;  Laterality: N/A;  . GIVENS CAPSULE STUDY N/A 11/03/2018   Procedure: GIVENS CAPSULE STUDY;  Surgeon: Lucilla Lame, MD;  Location: [facility] ENDOSCOPY;  Service: Endoscopy;  Laterality: N/A;  . JOINT REPLACEMENT Left    knee  . KNEE ARTHROPLASTY Right 04/09/2019   Procedure: COMPUTER ASSISTED TOTAL KNEE ARTHROPLASTY;  Surgeon: Dereck Leep, MD;  Location: [facility];  Service: Orthopedics;  Laterality: Right;  . KNEE CLOSED REDUCTION Left 04/15/2015   Procedure: CLOSED MANIPULATION KNEE;  Surgeon: Thornton Park, MD;  Location: [facility];  Service: Orthopedics;  Laterality: Left;  . KNEE SURGERY    . MAZE N/A 04/29/2020   Procedure: MAZE;  Surgeon: Rexene Alberts, MD;  Location: [place];  Service: Open Heart Surgery;  Laterality: N/A;  . MITRAL VALVE REPLACEMENT N/A 04/29/2020   Procedure: MITRAL VALVE (MV) REPLACEMENT USING ON-X VALVE SIZE 27/29MM;  Surgeon: Rexene Alberts, MD;  Location: [place];  Service: Open Heart Surgery;  Laterality: N/A;  . MULTIPLE EXTRACTIONS WITH ALVEOLOPLASTY N/A 02/28/2020   Procedure: MULTIPLE EXTRACTION WITH ALVEOLOPLASTY;  Surgeon: [name], DMD;  Location: [place];  Service: Dentistry;  Laterality: N/A;  . RIGHT/LEFT HEART CATH AND CORONARY ANGIOGRAPHY Bilateral 09/06/2019   Procedure: RIGHT/LEFT HEART CATH AND CORONARY ANGIOGRAPHY;  Surgeon: Minna Merritts, MD;  Location: [place] CV LAB;  Service: Cardiovascular;  Laterality: Bilateral;  . TEE WITHOUT CARDIOVERSION N/A 09/13/2017   Procedure: TRANSESOPHAGEAL ECHOCARDIOGRAM (TEE);  Surgeon: Nelva Bush, MD;  Location: [facility];  Service: Cardiovascular;  Laterality: N/A;  . TEE WITHOUT CARDIOVERSION N/A  04/01/2020   Procedure: TRANSESOPHAGEAL ECHOCARDIOGRAM (TEE);  Surgeon: [name], MD;  Location: [facility] ENDOSCOPY;  Service: Cardiovascular;  Laterality: N/A;  . TEE WITHOUT CARDIOVERSION N/A 04/29/2020   Procedure: TRANSESOPHAGEAL ECHOCARDIOGRAM (TEE);  Surgeon: Rexene Alberts, MD;  Location: [place];  Service: Open Heart Surgery;  Laterality: N/A;  . TOTAL KNEE ARTHROPLASTY Left 12/25/2014   Procedure: TOTAL KNEE ARTHROPLASTY;  Surgeon: Thornton Park, MD;  Location: [facility] ORS;  Service: Orthopedics;  Laterality: Left;  . TUBAL LIGATION      Medical History: Past Medical History:  Diagnosis Date  . (HFpEF) heart failure with preserved ejection fraction ([place])    a. 08/2017 Echo: EF 55-60%.  Grade 2 diastolic dysfunction.  Moderate mitral stenosis.  . Allergy   . Anemia   . Anxiety   .  BRCA negative 03/22/2013  . Bronchitis 02/19/2016   ON LEVAQUIN PO  . Chest tightness   . CHF (congestive heart failure) ([place])   . Cigarette smoker 09/11/2017   8-10 day  . Dyspnea   . Dysrhythmia   . GERD (gastroesophageal reflux disease)   . Heart murmur    Per pt. dx by Dr. Roxy Manns via [place]  . History of kidney stones   . Hyperlipidemia   . Hypertension   . Interstitial lung disease ([place])    a. CT 2013 b. 02/2018 CXR noted recurrent intersistial changes ILD vs chronic bronchitis  . Moderate mitral stenosis    a.  08/2017 TEE: EF 60 to 65%.  Moderate mitral stenosis.  Mean gradient 14 mmHg.  Valve area 2.59 cm by planimetry, 2.72 cm by pressure half-time.  . Persistent atrial fibrillation ([place])    a. 08/2017 s/p TEE/DCCV; b. CHA2DS2VASc = 2-->warfarin.  . Pneumonia   . S/P Maze operation for atrial fibrillation 04/29/2020   Complete bilateral atrial lesion set using bipolar radiofrequency and cryothermy ablation with clipping of LA appendage  . S/P mitral valve replacement with Onyx bileaflet mechanical valve 04/29/2020   27/29 mm Onyx-Valve    Family History: Family History  Problem Relation Age of Onset  . Cancer Mother 22       breast  . Hypertension Mother   . Cancer Maternal Aunt        breast  . Cancer Maternal Grandmother        breast  . Osteoarthritis Father   . Hypertension Father     Social History   Socioeconomic History  . Marital status: Married    Spouse name: Not on file  . Number of children: Not on file  . Years of education: Not on file  . Highest education level: Not on file  Occupational History  . Not on file  Tobacco Use  . Smoking status: Former Smoker    Packs/day: 0.50    Years: 20.00    Pack years: 10.00    Types: Cigarettes    Quit date: 02/20/2018    Years since quitting: 2.2  . Smokeless tobacco: Never Used  Vaping Use  . Vaping Use: Never used  Substance and Sexual Activity  . Alcohol use: Yes    Comment: rarely  . Drug  use: No  . Sexual activity: Not on file  Other Topics Concern  . Not on file  Social History Narrative  . Not on file   Social Determinants of Health   Financial Resource Strain: Low Risk   . Difficulty of Paying Living Expenses: Not very hard  Food Insecurity: No Food Insecurity  .  Worried About Charity fundraiser in the Last Year: Never true  . Ran Out of Food in the Last Year: Never true  Transportation Needs: No Transportation Needs  . Lack of Transportation (Medical): No  . Lack of Transportation (Non-Medical): No  Physical Activity: Not on file  Stress: Not on file  Social Connections: Not on file  Intimate Partner Violence: Not on file      Review of Systems  Constitutional: Positive for fatigue. Negative for chills and unexpected weight change.  HENT: Negative for congestion, postnasal drip, rhinorrhea, sneezing and sore throat.   Eyes: Negative for redness.  Respiratory: Negative for cough, chest tightness, shortness of breath and wheezing.   Cardiovascular: Negative for chest pain and palpitations.       Recovering from MVR with generalized pain  Gastrointestinal: Negative for abdominal pain, constipation, diarrhea, nausea and vomiting.  Genitourinary: Negative for dysuria and frequency.  Musculoskeletal: Negative for arthralgias, back pain, joint swelling and neck pain.  Skin: Positive for wound. Negative for rash.       Large vertical incision with 3 smaller surgical sites--  2 surgical sites have lost steri stips  Neurological: Negative.  Negative for tremors and numbness.  Hematological: Negative for adenopathy. Does not bruise/bleed easily.  Psychiatric/Behavioral: Negative for behavioral problems (Depression), sleep disturbance and suicidal ideas. The patient is not nervous/anxious.     Vital Signs: BP 140/82   Pulse (!) 101   Temp 98.6 F (37 C)   Resp 16   Ht '5\' 4"'  (1.626 m)   Wt 238 lb 3.2 oz (108 kg)   SpO2 97%   BMI 40.89 kg/m    Physical  Exam Vitals and nursing note reviewed.  Constitutional:      General: She is not in acute distress.    Appearance: She is well-developed. She is obese. She is not diaphoretic.  HENT:     Head: Normocephalic and atraumatic.     Mouth/Throat:     Pharynx: No oropharyngeal exudate.  Eyes:     Pupils: Pupils are equal, round, and reactive to light.  Neck:     Thyroid: No thyromegaly.     Vascular: No JVD.     Trachea: No tracheal deviation.  Cardiovascular:     Rate and Rhythm: Normal rate and regular rhythm.     Heart sounds: Normal heart sounds. No murmur heard. No friction rub. No gallop.   Pulmonary:     Effort: Pulmonary effort is normal. No respiratory distress.     Breath sounds: No wheezing or rales.  Chest:     Chest wall: No tenderness.  Abdominal:     General: Bowel sounds are normal.     Palpations: Abdomen is soft.  Musculoskeletal:        General: Normal range of motion.     Cervical back: Normal range of motion and neck supple.     Right lower leg: No edema.     Left lower leg: No edema.  Lymphadenopathy:     Cervical: No cervical adenopathy.  Skin:    General: Skin is warm and dry.     Findings: Lesion present.     Comments: Vertical chest incision healing from MVR surgery, 2 of the 3 smaller surgical sites have lost steri strips and are widening with minimal discharge. 1 steri strip in place still  Neurological:     Mental Status: She is alert and oriented to person, place, and time.     Cranial  Nerves: No cranial nerve deficit.  Psychiatric:        Behavior: Behavior normal.        Thought Content: Thought content normal.        Judgment: Judgment normal.       Assessment/Plan: 1. Encounter for examination following treatment at hospital Discharged yesterday following planned mitral valve replacement  2. S/P mitral valve replacement with Onyx bileaflet mechanical valve Doing well, followed by cardiology. Pt does have opening surgical wounds where  steri strips fell off--advised to call surgeon's office and notify them of opening with some drainage  3. S/P Maze operation for atrial fibrillation Doing well, followed by cardiology  4. Chronic heart failure with preserved ejection fraction ([place]) Followed by cardiology, medications adjusted in hospital with repeat echo planned  5. Paroxysmal atrial fibrillation (H[facility]) S/p maze procedure, pt on warfarin with INR monitored by cardiology  6. Iron deficiency anemia secondary to blood loss (chronic) Stable upon discharge, will need to monitor closely and repeat labs  7. Stage 3b chronic kidney disease ([place]) Seen by nephrology during stay with stabilization of labs, followed by nephrology for routine visits.   General Counseling: Ariaunna verbalizes understanding of the findings of todays visit and agrees with plan of treatment. I have discussed any further diagnostic evaluation that may be needed or ordered today. We also reviewed her medications today. she has been encouraged to call the office with any questions or concerns that should arise related to todays visit.    Counseling:    No orders of the defined types were placed in this encounter.   This patient was seen by Drema Dallas, PA-C in collaboration with Dr. Clayborn Bigness as a part of collaborative care agreement.   I have reviewed all medical records from hospital follow up including radiology reports and consults from other physicians. Appropriate follow up diagnostics will be scheduled as needed. Patient/ Family understands the plan of treatment. Time spent 40 minutes.   Dr Lavera Guise, MD Internal Medicine

## 2020-05-13 ENCOUNTER — Telehealth: Payer: Self-pay

## 2020-05-13 NOTE — Telephone Encounter (Signed)
Patient contacted the office stating that she did not get her prescription for Tramadol.  She was discharged from the hospital 05/11/20.  Spoke with pharmacy prescription was sent to. Pharmacy stated that prescription would not be picked up. Patient made aware that a refill would not be given at this time. Left voicemail message, will await return call.

## 2020-05-14 ENCOUNTER — Other Ambulatory Visit: Payer: Self-pay

## 2020-05-14 ENCOUNTER — Other Ambulatory Visit (INDEPENDENT_AMBULATORY_CARE_PROVIDER_SITE_OTHER): Payer: 59

## 2020-05-14 ENCOUNTER — Ambulatory Visit (INDEPENDENT_AMBULATORY_CARE_PROVIDER_SITE_OTHER): Payer: 59

## 2020-05-14 DIAGNOSIS — Z5181 Encounter for therapeutic drug level monitoring: Secondary | ICD-10-CM

## 2020-05-14 DIAGNOSIS — I4891 Unspecified atrial fibrillation: Secondary | ICD-10-CM | POA: Diagnosis not present

## 2020-05-14 DIAGNOSIS — I050 Rheumatic mitral stenosis: Secondary | ICD-10-CM

## 2020-05-14 LAB — POCT INR: INR: 2.3 (ref 2.0–3.0)

## 2020-05-14 NOTE — Patient Instructions (Addendum)
-   per Dr. Vivi Martens instructions, continue warfarin dosage of 5 mg every day - since this is a big dose increase, make sure you have your greens - recheck in 1 week

## 2020-05-15 LAB — BASIC METABOLIC PANEL
BUN/Creatinine Ratio: 18 (ref 9–23)
BUN: 28 mg/dL — ABNORMAL HIGH (ref 6–24)
CO2: 23 mmol/L (ref 20–29)
Calcium: 9.3 mg/dL (ref 8.7–10.2)
Chloride: 96 mmol/L (ref 96–106)
Creatinine, Ser: 1.58 mg/dL — ABNORMAL HIGH (ref 0.57–1.00)
Glucose: 108 mg/dL — ABNORMAL HIGH (ref 65–99)
Potassium: 3.6 mmol/L (ref 3.5–5.2)
Sodium: 139 mmol/L (ref 134–144)
eGFR: 39 mL/min/{1.73_m2} — ABNORMAL LOW (ref >59)

## 2020-05-21 ENCOUNTER — Ambulatory Visit (INDEPENDENT_AMBULATORY_CARE_PROVIDER_SITE_OTHER): Payer: 59

## 2020-05-21 ENCOUNTER — Other Ambulatory Visit: Payer: Self-pay

## 2020-05-21 ENCOUNTER — Telehealth: Payer: Self-pay

## 2020-05-21 DIAGNOSIS — Z5181 Encounter for therapeutic drug level monitoring: Secondary | ICD-10-CM | POA: Diagnosis not present

## 2020-05-21 DIAGNOSIS — I4891 Unspecified atrial fibrillation: Secondary | ICD-10-CM | POA: Diagnosis not present

## 2020-05-21 DIAGNOSIS — I050 Rheumatic mitral stenosis: Secondary | ICD-10-CM

## 2020-05-21 LAB — POCT INR: INR: 1.5 — AB (ref 2.0–3.0)

## 2020-05-21 NOTE — Patient Instructions (Signed)
-   resume warfarin dosage of 5 mg every day - make sure you have your greens on a schedule  - recheck in 1 week

## 2020-05-21 NOTE — Telephone Encounter (Signed)
Physical therapist Collier Salina [phone] called, pt canceled PT appt today

## 2020-05-23 ENCOUNTER — Other Ambulatory Visit: Payer: Self-pay | Admitting: Thoracic Surgery (Cardiothoracic Vascular Surgery)

## 2020-05-23 DIAGNOSIS — Z952 Presence of prosthetic heart valve: Secondary | ICD-10-CM

## 2020-05-26 ENCOUNTER — Ambulatory Visit (INDEPENDENT_AMBULATORY_CARE_PROVIDER_SITE_OTHER): Payer: Self-pay | Admitting: Surgical

## 2020-05-26 ENCOUNTER — Other Ambulatory Visit: Payer: Self-pay

## 2020-05-26 ENCOUNTER — Ambulatory Visit
Admission: RE | Admit: 2020-05-26 | Discharge: 2020-05-26 | Disposition: A | Discharge status: Home / Self Care | Payer: 59 | Source: Ambulatory Visit | Attending: Thoracic Surgery (Cardiothoracic Vascular Surgery) | Admitting: Thoracic Surgery (Cardiothoracic Vascular Surgery)

## 2020-05-26 VITALS — BP 144/78 | HR 105 | Temp 97.9°F | Resp 18 | Ht 64.0 in | Wt 232.4 lb

## 2020-05-26 DIAGNOSIS — Z8679 Personal history of other diseases of the circulatory system: Secondary | ICD-10-CM

## 2020-05-26 DIAGNOSIS — Z954 Presence of other heart-valve replacement: Secondary | ICD-10-CM

## 2020-05-26 DIAGNOSIS — Z952 Presence of prosthetic heart valve: Secondary | ICD-10-CM

## 2020-05-26 DIAGNOSIS — Z9889 Other specified postprocedural states: Secondary | ICD-10-CM

## 2020-05-26 NOTE — Assessment & Plan Note (Signed)
Steady progress in post surgical recovery

## 2020-05-26 NOTE — Patient Instructions (Signed)
Routine activity progression 

## 2020-05-26 NOTE — Progress Notes (Signed)
[address]       [place] [address]             [phone]      Leslie [name] [place] Medical Record #[id number] Date of Birth: 1965-03-08  Referring: Minna Merritts, MD Primary Care: Lavera Guise, MD Primary Cardiologist: Ida Rogue, MD   Chief Complaint:   POST OP FOLLOW UP CARDIOTHORACIC SURGERY OPERATIVE NOTE  Date of Procedure:                04/29/2020  Preoperative Diagnosis:        Rheumatic Heart Disease  Severe Mitral Stenosis  Recurrent Paroxysmal Atrial Fibrillation  Postoperative Diagnosis:    Same  Procedure:       Mitral Valve Replacement              Onyx Bileaflet Mechanical Valve (size 27/44m, REF #[id number], serial ##[id number]   Maze Procedure              Complete bilateral atrial lesion set using bipolar radiofrequency and cryothermy ablation             Clipping of left atrial appendage (Atricure Pro245 left atrial clip, size 470m               Surgeon:        ClValentina GuOwRoxy MannsMD  Assistant:       ErEllwood HandlerPA-C  Anesthesia:    GrDonney DiceDO  Operative Findings: ? 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 Normal left ventricular systolic function  History of Present Illness:    Patient is a [age]ear old female status post the above described procedure seen in the office on today's date and routine postsurgical follow-up.  She does report she has some occasional mild shortness of breath but this has significantly improved over time.  She does have some numbness in her left hand in the ulnar distribution but overall function has been okay.  There has been a little bit of weakness associated with it.  She does have occasional palpitations with increased activity but this resolves quickly with rest.  She denies lower extremity edema.  Incisions have been healing well although the chest tube sites have had a superficial dehiscence.  There has been no drainage or cellulitis  associated with this.      Past Medical History:  Diagnosis Date  . (HFpEF) heart failure with preserved ejection fraction ([facility]   a. 08/2017 Echo: EF 55-60%.  Grade 2 diastolic dysfunction.  Moderate mitral stenosis.  . Allergy   . Anemia   . Anxiety   . BRCA negative 03/22/2013  . Bronchitis 02/19/2016   ON LEVAQUIN PO  . Chest tightness   . CHF (congestive heart failure) (H[facility]  . Cigarette smoker 09/11/2017   8-10 day  . Dyspnea   . Dysrhythmia   . GERD (gastroesophageal reflux disease)   . Heart murmur    Per pt. dx by Dr. OwRoxy Mannsia stStaley. History of kidney stones   . Hyperlipidemia   . Hypertension   . Interstitial lung disease ([facility]   a. CT 2013 b. 02/2018 CXR noted recurrent intersistial changes ILD vs chronic bronchitis  . Moderate mitral stenosis    a.  08/2017 TEE: EF 60 to 65%.  Moderate mitral stenosis.  Mean gradient 14 mmHg.  Valve area 2.59 cm by planimetry, 2.72 cm by pressure half-time.  . Persistent  atrial fibrillation ([place])    a. 08/2017 s/p TEE/DCCV; b. CHA2DS2VASc = 2-->warfarin.  . Pneumonia   . S/P Maze operation for atrial fibrillation 04/29/2020   Complete bilateral atrial lesion set using bipolar radiofrequency and cryothermy ablation with clipping of LA appendage  . S/P mitral valve replacement with Onyx bileaflet mechanical valve 04/29/2020   27/29 mm Onyx-Valve     Social History   Tobacco Use  Smoking Status Former Smoker  . Packs/day: 0.50  . Years: 20.00  . Pack years: 10.00  . Types: Cigarettes  . Quit date: 02/20/2018  . Years since quitting: 2.2  Smokeless Tobacco Never Used    Social History   Substance and Sexual Activity  Alcohol Use Yes   Comment: rarely     Allergies  Allergen Reactions  . Morphine Hives and Rash  . Oxycodone Hcl Hives and Itching  . Dilaudid [Hydromorphone Hcl] Itching    Current Outpatient Medications  Medication Sig Dispense Refill  . acetaminophen (TYLENOL) 500 MG tablet Take 1,000  mg by mouth every 6 (six) hours as needed for moderate pain.    Marland Kitchen albuterol (VENTOLIN HFA) 108 (90 Base) MCG/ACT inhaler Inhale 2 puffs into the lungs every 6 (six) hours as needed for wheezing or shortness of breath. 8 g 2  . amLODipine (NORVASC) 10 MG tablet Take 1 tablet (10 mg total) by mouth daily. 30 tablet 2  . amoxicillin (AMOXIL) 500 MG tablet Take 2,000 mg by mouth See admin instructions. Take 4 capsules (2000 mg) by mouth 1 hour prior to dental work    . aspirin EC 81 MG tablet Take 1 tablet (81 mg total) by mouth daily. Swallow whole. 100 tablet 2  . atorvastatin (LIPITOR) 80 MG tablet Take 1 tablet (80 mg total) by mouth every evening.    . calcium carbonate (TUMS - DOSED IN MG ELEMENTAL CALCIUM) 500 MG chewable tablet Chew 500 mg by mouth daily as needed for indigestion or heartburn.    . ferrous [id number] C-folic acid (TRINSICON / FOLTRIN) capsule Take 1 capsule by mouth 2 (two) times daily after a meal. 60 capsule 2  . fluticasone (FLONASE) 50 MCG/ACT nasal spray Place 1 spray into both nostrils daily as needed for rhinitis.    . metoprolol tartrate (LOPRESSOR) 25 MG tablet Take 1 tablet (25 mg total) by mouth 2 (two) times daily. 60 tablet 1  . potassium chloride SA (KLOR-CON) 20 MEQ tablet Take 2 tablets (40 mEq total) by mouth daily. 60 tablet 0  . spironolactone (ALDACTONE) 25 MG tablet Take 0.5 tablets (12.5 mg total) by mouth daily. 30 tablet 2  . torsemide (DEMADEX) 20 MG tablet TAKE 2 TABLETS(40 MG) BY MOUTH TWICE DAILY (Patient taking differently: Take 40 mg by mouth in the morning and at bedtime.) 360 tablet 3  . warfarin (COUMADIN) 5 MG tablet Take 1 tablet (5 mg total) by mouth daily at 4 PM. As instructed by Coumadin clinic for lifetime 100 tablet 4   No current facility-administered medications for this visit.       Physical Exam: BP (!) 144/78 (BP Location: Right Arm, Patient Position: Sitting, Cuff Size: Normal)   Pulse (!) 105   Temp 97.9 F (36.6  C) (Skin)   Resp 18   Ht '5\' 4"'  (1.626 m)   Wt 232 lb 6.4 oz (105.4 kg)   SpO2 96% Comment: RA  BMI 39.89 kg/m   General appearance: alert, cooperative and no distress Heart: regular rate and rhythm, no rub  and no murmur Lungs: clear to auscultation bilaterally Abdomen: obese, non-tender Extremities: no edema Wound: Sternotomy incision is healing well.  3 chest tube sites have a good granulation base with no drainage or erythema.   Diagnostic Studies & Laboratory data:     Recent Radiology Findings:   DG Chest 2 View  Result Date: 05/26/2020 CLINICAL DATA:  Mitral valve replacement. EXAM: CHEST - 2 VIEW COMPARISON:  05/05/2020 and CT chest 10/04/2019. FINDINGS: Trachea is midline. Heart size normal. Mitral valve replacement and left atrial appendage clip in place. Mild mid and lower lung zone pulmonary vascular prominence. Linear atelectasis or scarring in the lingula. No pleural fluid. No pneumothorax. IMPRESSION: Central pulmonary vascular congestion versus mild pulmonary edema. Electronically Signed   By: Lorin Picket M.D.   On: 05/26/2020 15:09      Recent Lab Findings: Lab Results  Component Value Date   WBC 12.9 (H) 05/08/2020   HGB 9.2 (L) 05/08/2020   HCT 27.8 (L) 05/08/2020   PLT 451 (H) 05/08/2020   GLUCOSE 108 (H) 05/14/2020   CHOL 204 (H) 03/07/2020   TRIG 224 (H) 03/07/2020   HDL 35 (L) 03/07/2020   LDLCALC 129 (H) 03/07/2020   ALT 33 05/05/2020   AST 36 05/05/2020   NA 139 05/14/2020   K 3.6 05/14/2020   CL 96 05/14/2020   CREATININE 1.58 (H) 05/14/2020   BUN 28 (H) 05/14/2020   CO2 23 05/14/2020   TSH 0.250 (L) 03/07/2020   INR 1.5 (A) 05/21/2020   HGBA1C 6.5 (H) 04/25/2020      Assessment / Plan: Patient is overall doing well.  She is being seen in the Coumadin clinic but has not yet been seen by cardiology.  She has an appointment with Dr. Rockey Situ in June and have asked her to call the office to see if this can be scheduled prior to that.  I  instructed her to clean her chest tube site incisions gently with soap and water and cover with a dry sterile gauze and paper tape.  I did not make any change to her current medication regimen.  Her chest x-ray was reviewed and findings are noted above.  She does continue on low-dose torsemide.  We did discuss activity progression including driving which she wants to wait a couple weeks on till she is more comfortable.  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  We will see her in 1 month for follow-up.      Medication Changes: No orders of the defined types were placed in this encounter.     John Giovanni, PA-C 05/26/2020 3:35 PM

## 2020-05-28 ENCOUNTER — Telehealth: Payer: Self-pay

## 2020-05-28 ENCOUNTER — Other Ambulatory Visit: Payer: Self-pay

## 2020-05-28 ENCOUNTER — Telehealth: Payer: Self-pay | Admitting: Internal Medicine

## 2020-05-28 ENCOUNTER — Ambulatory Visit (INDEPENDENT_AMBULATORY_CARE_PROVIDER_SITE_OTHER): Payer: 59

## 2020-05-28 ENCOUNTER — Other Ambulatory Visit
Admission: RE | Admit: 2020-05-28 | Discharge: 2020-05-28 | Disposition: A | Discharge status: Home / Self Care | Payer: 59 | Attending: Internal Medicine | Admitting: Internal Medicine

## 2020-05-28 DIAGNOSIS — I4891 Unspecified atrial fibrillation: Secondary | ICD-10-CM | POA: Diagnosis present

## 2020-05-28 DIAGNOSIS — I050 Rheumatic mitral stenosis: Secondary | ICD-10-CM

## 2020-05-28 DIAGNOSIS — R791 Abnormal coagulation profile: Secondary | ICD-10-CM | POA: Diagnosis present

## 2020-05-28 DIAGNOSIS — Z5181 Encounter for therapeutic drug level monitoring: Secondary | ICD-10-CM | POA: Insufficient documentation

## 2020-05-28 LAB — PROTIME-INR
INR: 4.8 (ref 0.8–1.2)
Prothrombin Time: 45.2 seconds — ABNORMAL HIGH (ref 11.4–15.2)

## 2020-05-28 LAB — POCT INR: INR: 8 — AB (ref 2.0–3.0)

## 2020-05-28 NOTE — Telephone Encounter (Signed)
Pt had STAT labs drawn @ [facility] this am.  Will call pt w/ instructions as soon as these result.

## 2020-05-28 NOTE — Telephone Encounter (Signed)
Pateint husband calling in to speak about patients visit and INR result  Please advise

## 2020-05-28 NOTE — Telephone Encounter (Signed)
Called pt to give INR results and warfarin instructions (please see anti-coag note for today). Pt's husband asked to speak to me about what would affects pt's INR and possibly make it go so high. Advised him that pt's INR on my machine was >8.0, but it was only 4.8 via venipuncture. Advised him that many things affect INR, such as vit k intake, medications, diarrhea, constipation, etc. He asks if he is stressing her out and sending her INR up. He states that if he is a stressor to her, that he will "give her a break" to let her INR come back down. Reassured him that this is not a factor and that he can continue to care for his wife. He is appreciative of the call back.

## 2020-05-28 NOTE — Telephone Encounter (Signed)
Lab calling with  results

## 2020-05-28 NOTE — Patient Instructions (Addendum)
-   skip warfarin today, then - resume warfarin dosage of 5 mg every day - recheck INR in 1 week - if still elevated next week, will reduce dosage.

## 2020-05-28 NOTE — Telephone Encounter (Signed)
Please see result note 

## 2020-05-28 NOTE — Telephone Encounter (Signed)
Home health service was canceled by patient, Tiffany from [facility], [place]  for physical therapy , stated patient called and canceled future services.

## 2020-06-02 ENCOUNTER — Other Ambulatory Visit: Payer: Self-pay

## 2020-06-02 ENCOUNTER — Encounter: Payer: Self-pay | Admitting: Family

## 2020-06-02 ENCOUNTER — Ambulatory Visit (INDEPENDENT_AMBULATORY_CARE_PROVIDER_SITE_OTHER): Payer: 59 | Admitting: Family

## 2020-06-02 VITALS — BP 136/64 | HR 92 | Ht 64.0 in | Wt 232.0 lb

## 2020-06-02 DIAGNOSIS — Z954 Presence of other heart-valve replacement: Secondary | ICD-10-CM

## 2020-06-02 DIAGNOSIS — I050 Rheumatic mitral stenosis: Secondary | ICD-10-CM | POA: Diagnosis not present

## 2020-06-02 DIAGNOSIS — Z7901 Long term (current) use of anticoagulants: Secondary | ICD-10-CM | POA: Diagnosis not present

## 2020-06-02 DIAGNOSIS — I5032 Chronic diastolic (congestive) heart failure: Secondary | ICD-10-CM

## 2020-06-02 DIAGNOSIS — E785 Hyperlipidemia, unspecified: Secondary | ICD-10-CM

## 2020-06-02 DIAGNOSIS — I480 Paroxysmal atrial fibrillation: Secondary | ICD-10-CM | POA: Diagnosis not present

## 2020-06-02 MED ORDER — TORSEMIDE 20 MG PO TABS: 40.0000 mg | ORAL_TABLET | Freq: Two times a day (BID) | ORAL | 1 refills | Status: DC

## 2020-06-02 MED ORDER — METOPROLOL TARTRATE 25 MG PO TABS: 25.0000 mg | ORAL_TABLET | Freq: Two times a day (BID) | ORAL | 1 refills | Status: DC

## 2020-06-02 MED ORDER — SPIRONOLACTONE 25 MG PO TABS: 12.5000 mg | ORAL_TABLET | Freq: Every day | ORAL | 1 refills | Status: DC

## 2020-06-02 MED ORDER — ATORVASTATIN CALCIUM 80 MG PO TABS: 80.0000 mg | ORAL_TABLET | Freq: Every evening | ORAL | 1 refills | Status: DC

## 2020-06-02 MED ORDER — AMLODIPINE BESYLATE 10 MG PO TABS: 10.0000 mg | ORAL_TABLET | Freq: Every day | ORAL | 1 refills | Status: DC

## 2020-06-02 NOTE — Progress Notes (Signed)
Office Visit    Patient Name: [name] Date of Encounter: 06/02/2020  PCP:  Lavera Guise, MD   [place]  Cardiologist:  Ida Rogue, MD  Advanced Practice Provider:  No care team member to display Electrophysiologist:  None    Chief Complaint    Leslie [name] is a 55 y.o. 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   Past Medical History    Past Medical History:  Diagnosis Date  . (HFpEF) heart failure with preserved ejection fraction ([name])    a. 08/2017 Echo: EF 55-60%.  Grade 2 diastolic dysfunction.  Moderate mitral stenosis.  . Allergy   . Anemia   . Anxiety   . BRCA negative 03/22/2013  . Bronchitis 02/19/2016   ON LEVAQUIN PO  . Chest tightness   . CHF (congestive heart failure) ([name])   . Cigarette smoker 09/11/2017   8-10 day  . Dyspnea   . Dysrhythmia   . GERD (gastroesophageal reflux disease)   . Heart murmur    Per pt. dx by Dr. Roxy Manns via [place]  . History of kidney stones   . Hyperlipidemia   . Hypertension   . Interstitial lung disease ([name])    a. CT 2013 b. 02/2018 CXR noted recurrent intersistial changes ILD vs chronic bronchitis  . Moderate mitral stenosis    a.  08/2017 TEE: EF 60 to 65%.  Moderate mitral stenosis.  Mean gradient 14 mmHg.  Valve area 2.59 cm by planimetry, 2.72 cm by pressure half-time.  . Persistent atrial fibrillation ([name])    a. 08/2017 s/p TEE/DCCV; b. CHA2DS2VASc = 2-->warfarin.  . Pneumonia   . S/P Maze operation for atrial fibrillation 04/29/2020   Complete bilateral atrial lesion set using bipolar radiofrequency and cryothermy ablation with clipping of LA appendage  . S/P mitral valve replacement with Onyx bileaflet mechanical valve 04/29/2020   27/29 mm Onyx-Valve    Past Surgical History:  Procedure Laterality Date  . ABDOMINAL HYSTERECTOMY     total  . ABDOMINAL HYSTERECTOMY    . BREAST BIOPSY Bilateral 2012  . BREAST BIOPSY Right 08-07-12   fibroadenomatous changes and columnar cells  . BREAST BIOPSY  02/03/2015   stereo byrnett  . BUBBLE STUDY  04/01/2020   Procedure: BUBBLE STUDY;  Surgeon: [name], MD;  Location: [facility];  Service: Cardiovascular;;  . CARDIAC CATHETERIZATION    . CHOLECYSTECTOMY N/A 02/27/2016   Procedure: LAPAROSCOPIC CHOLECYSTECTOMY;  Surgeon: Christene Lye, MD;  Location: [facility] ORS;  Service: General;  Laterality: N/A;  . CLIPPING OF ATRIAL APPENDAGE  04/29/2020   Procedure: CLIPPING OF ATRIAL APPENDAGE USING ATRICURE  PRO2 CLIP SIZE 45MM;  Surgeon: Rexene Alberts, MD;  Location: [facility] OR;  Service: Open Heart Surgery;;  . COLONOSCOPY WITH PROPOFOL N/A 09/26/2018   Procedure: COLONOSCOPY WITH PROPOFOL;  Surgeon: Lucilla Lame, MD;  Location: [facility] ENDOSCOPY;  Service: Endoscopy;  Laterality: N/A;  . CYSTOSCOPY W/ RETROGRADES Right 08/18/2018   Procedure: CYSTOSCOPY WITH RETROGRADE PYELOGRAM;  Surgeon: Billey Co, MD;  Location: [facility] ORS;  Service: Urology;  Laterality: Right;  . CYSTOSCOPY/URETEROSCOPY/HOLMIUM LASER/STENT PLACEMENT Right 08/18/2018   Procedure: CYSTOSCOPY/URETEROSCOPY/STENT PLACEMENT;  Surgeon: Billey Co, MD;  Location: [facility] ORS;  Service: Urology;  Laterality: Right;  . DIAGNOSTIC LAPAROSCOPY    .  ESOPHAGOGASTRODUODENOSCOPY (EGD) WITH PROPOFOL N/A 09/26/2018   Procedure: ESOPHAGOGASTRODUODENOSCOPY (EGD) WITH PROPOFOL;  Surgeon: Lucilla Lame, MD;  Location: [name] ENDOSCOPY;  Service: Endoscopy;  Laterality: N/A;  . GIVENS CAPSULE STUDY N/A 11/03/2018   Procedure: GIVENS CAPSULE STUDY;  Surgeon: Lucilla Lame, MD;  Location: [facility] ENDOSCOPY;  Service: Endoscopy;  Laterality: N/A;  . JOINT REPLACEMENT Left    knee  . KNEE ARTHROPLASTY Right 04/09/2019   Procedure: COMPUTER ASSISTED TOTAL  KNEE ARTHROPLASTY;  Surgeon: Dereck Leep, MD;  Location: [facility] ORS;  Service: Orthopedics;  Laterality: Right;  . KNEE CLOSED REDUCTION Left 04/15/2015   Procedure: CLOSED MANIPULATION KNEE;  Surgeon: Thornton Park, MD;  Location: [facility] ORS;  Service: Orthopedics;  Laterality: Left;  . KNEE SURGERY    . MAZE N/A 04/29/2020   Procedure: MAZE;  Surgeon: Rexene Alberts, MD;  Location: [facility];  Service: Open Heart Surgery;  Laterality: N/A;  . MITRAL VALVE REPLACEMENT N/A 04/29/2020   Procedure: MITRAL VALVE (MV) REPLACEMENT USING ON-X VALVE SIZE 27/29MM;  Surgeon: Rexene Alberts, MD;  Location: [name];  Service: Open Heart Surgery;  Laterality: N/A;  . MULTIPLE EXTRACTIONS WITH ALVEOLOPLASTY N/A 02/28/2020   Procedure: MULTIPLE EXTRACTION WITH ALVEOLOPLASTY;  Surgeon: [name], DMD;  Location: [name];  Service: Dentistry;  Laterality: N/A;  . RIGHT/LEFT HEART CATH AND CORONARY ANGIOGRAPHY Bilateral 09/06/2019   Procedure: RIGHT/LEFT HEART CATH AND CORONARY ANGIOGRAPHY;  Surgeon: Minna Merritts, MD;  Location: [facility] CV LAB;  Service: Cardiovascular;  Laterality: Bilateral;  . TEE WITHOUT CARDIOVERSION N/A 09/13/2017   Procedure: TRANSESOPHAGEAL ECHOCARDIOGRAM (TEE);  Surgeon: Nelva Bush, MD;  Location: [facility] ORS;  Service: Cardiovascular;  Laterality: N/A;  . TEE WITHOUT CARDIOVERSION N/A 04/01/2020   Procedure: TRANSESOPHAGEAL ECHOCARDIOGRAM (TEE);  Surgeon: [name], MD;  Location: [name];  Service: Cardiovascular;  Laterality: N/A;  . TEE WITHOUT CARDIOVERSION N/A 04/29/2020   Procedure: TRANSESOPHAGEAL ECHOCARDIOGRAM (TEE);  Surgeon: Rexene Alberts, MD;  Location: [name];  Service: Open Heart Surgery;  Laterality: N/A;  . TOTAL KNEE ARTHROPLASTY Left 12/25/2014   Procedure: TOTAL KNEE ARTHROPLASTY;  Surgeon: Thornton Park, MD;  Location: [facility] ORS;  Service: Orthopedics;  Laterality: Left;  . TUBAL LIGATION      Allergies  Allergies  Allergen Reactions   . Morphine Hives and Rash  . Oxycodone Hcl Hives and Itching  . Dilaudid [Hydromorphone Hcl] Itching    History of Present Illness    Emiliana L Scarpati is a 55 y.o. 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  Her atrial fibrillation was diagnosed in August 2018 in setting of hospitalization for chest tightness.  TEE with moderate mitral stenosis.  She underwent cardioversion and was anticoagulated on acute medicine in setting of valvular atrial fed.  She had coronary CTA 02/2018 due to chest tightness with nonobstructive CAD, calcium score of 10 placing her in the 89th percentile for age and sex, and dilated pulmonary artery suggestive of pulmonary hypertension.     She was seen in clinic 08/07/2018 noting weight gain and increasing dyspnea.  She was recommended for echocardiogram and her Lasix was increased. She was noted to the [name] 08/26/2018 with sepsis likely secondary to acute pyelonephritis, AKI, acute respiratory failure with hypoxia due to acute on chronic diastolic heart failure. She underwent echocardiogram during admission 08/29/2018 with EF greater than  65%, indeterminate diastolic parameters, RV mildly enlarged with normal systolic function, LA mildly dilated, moderate to severe mitral valve stenosis with mean gradient of 16 mmHg.    She underwent knee replacement 04/09/2019.   Echo 06/27/2019 EF 65 to 12%, grade 2 diastolic dysfunction, moderately elevated PASP, LA moderately dilated, mild MR with severe mitral stenosis with mean gradient of 10 mmHg.  She has been seen in follow-up routinely with multiple adjustments to antihypertensive therapies as well as her diuretic therapies.  She has had difficulties with fluid retention, breathing, palpitations.   She had right and left heart cath 09/06/2018 with no  significant coronaries, moderately elevated right heart pressures consistent with moderate pulmonary hypertension.   She saw Dr. Roxy Manns for surgery consult 09/10/2019.  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  She has dental procedure and CT upcoming next week for her preoperative work-up. Plan for MVR and MAZE.   She underwent transesophageal echocardiogram 04/01/2020-confirming rheumatic heart disease with severe mitral stenosis.  On 04/29/2020 she underwent median sternotomy, mitral valve replacement, MAZE.  Coumadin was initiated due to mechanical MVR.  She notices increasing palpitations a few times per week which last a few minutes and self resolve. She has been taking Toprol 175m BID. Reports stable DOE. No SOB at rest. No chest pain, pressure, tightness. No LE edema, orthopnea, PND.  She did require 1 unit of PRBC during admission as well as iron supplementation.  She had some acute kidney injury, hyponatremia, hypokalemia, and required heart failure input for milrinone.   She presents today for follow-up with her husband.  Reports feeling overall well since discharge.  She has been walking twice per day for 10 minutes with only mild dyspnea on exertion and some very mild chest tightness which quickly self resolved.  She is excited to participate in cardiac rehab.  She denies edema, orthopnea, PND.  She reports no palpitations.  She has been placing a dry gauze dressing on her chest tube incisions and notes that when she pulls it off it often pulls the scab off which is uncomfortable.  Denies hematuria, melena.   EKGs/Labs/Other Studies Reviewed:   The following studies were reviewed today:  Echo 05/02/2020  1. Left ventricular ejection fraction, by estimation, is 60 to 65%. The  left ventricle has normal function. The left ventricle has no regional  wall motion abnormalities.   2.  The mitral valve has been repaired/replaced. No evidence of mitral  valve regurgitation. The mean mitral valve gradient is 4.0 mmHg. There is  a 27/29 mm Onyx-Valve mechanical valve present in the mitral position.  Procedure Date: 04/29/2020.   Vascular ultrasound Doppler.  CABG carotid and arm/legs 04/25/2020 Right Carotid: Velocities in the right ICA are consistent with a 1-39%  stenosis.   Left Carotid: Velocities in the left ICA are consistent with a 1-39%  stenosis.  Vertebrals: Bilateral vertebral arteries demonstrate antegrade flow.  Subclavians: Normal flow hemodynamics were seen in bilateral subclavian               arteries.   Right ABI: Resting right ankle-brachial index is within normal range. No  evidence of significant right lower extremity arterial disease.  Left ABI: Resting left ankle-brachial index is within normal range. No  evidence of significant left lower extremity arterial disease.  Right Upper Extremity: Doppler waveforms remain within normal limits with  right radial compression. Doppler waveform obliterate with  right ulnar  compression.  Left Upper Extremity: Doppler waveforms remain within normal limits with  left radial compression. Doppler waveform obliterate with left ulnar  compression.   Right and left heart cath 09/06/2019 Final Conclusions:  No significant coronary artery disease Moderately elevated right heart pressures consistent with moderate pulmonary hypertension Recent echocardiogram with severe mitral valve stenosis   EKG:  EKG is ordered today.  The ekg ordered today demonstrates NSR 92 bpm with 1st degree AV block (PR 214) with stable TWI in aVR and aVL with no acute ST/T wave changes.  Recent Labs: 03/07/2020: TSH 0.250 05/05/2020: ALT 33 05/08/2020: Hemoglobin 9.2; Magnesium 2.0; Platelets 451 05/14/2020: BUN 28; Creatinine, Ser 1.58; Potassium 3.6; Sodium 139  Recent Lipid Panel    Component Value Date/Time   CHOL 204 (H) 03/07/2020  1039   TRIG 224 (H) 03/07/2020 1039   HDL 35 (L) 03/07/2020 1039   CHOLHDL 5.2 09/19/2018 0932   VLDL 37 09/19/2018 0932   LDLCALC 129 (H) 03/07/2020 1039   Home Medications   Current Meds  Medication Sig  . acetaminophen (TYLENOL) 500 MG tablet Take 1,000 mg by mouth every 6 (six) hours as needed for moderate pain.  Marland Kitchen albuterol (VENTOLIN HFA) 108 (90 Base) MCG/ACT inhaler Inhale 2 puffs into the lungs every 6 (six) hours as needed for wheezing or shortness of breath.  Marland Kitchen amLODipine (NORVASC) 10 MG tablet Take 1 tablet (10 mg total) by mouth daily.  Marland Kitchen amoxicillin (AMOXIL) 500 MG tablet Take 2,000 mg by mouth See admin instructions. Take 4 capsules (2000 mg) by mouth 1 hour prior to dental work  . aspirin EC 81 MG tablet Take 1 tablet (81 mg total) by mouth daily. Swallow whole.  Marland Kitchen atorvastatin (LIPITOR) 80 MG tablet Take 1 tablet (80 mg total) by mouth every evening.  . calcium carbonate (TUMS - DOSED IN MG ELEMENTAL CALCIUM) 500 MG chewable tablet Chew 500 mg by mouth daily as needed for indigestion or heartburn.  . ferrous [id number] C-folic acid (TRINSICON / FOLTRIN) capsule Take 1 capsule by mouth 2 (two) times daily after a meal.  . fluticasone (FLONASE) 50 MCG/ACT nasal spray Place 1 spray into both nostrils daily as needed for rhinitis.  . metoprolol tartrate (LOPRESSOR) 25 MG tablet Take 1 tablet (25 mg total) by mouth 2 (two) times daily.  . potassium chloride SA (KLOR-CON) 20 MEQ tablet Take 2 tablets (40 mEq total) by mouth daily.  Marland Kitchen spironolactone (ALDACTONE) 25 MG tablet Take 0.5 tablets (12.5 mg total) by mouth daily.  Marland Kitchen torsemide (DEMADEX) 20 MG tablet Take 40 mg by mouth 2 (two) times daily.  Marland Kitchen warfarin (COUMADIN) 5 MG tablet Take 1 tablet (5 mg total) by mouth daily at 4 PM. As instructed by Coumadin clinic for lifetime  . [DISCONTINUED] torsemide (DEMADEX) 20 MG tablet TAKE 2 TABLETS(40 MG) BY MOUTH TWICE DAILY (Patient taking differently: Take 40 mg by mouth in  the morning and at bedtime.)     Review of Systems  All other systems reviewed and are otherwise negative except as noted above.  Physical Exam    VS:  BP 136/64   Pulse 92   Ht '5\' 4"'  (1.626 m)   Wt 232 lb (105.2 kg)   BMI 39.82 kg/m  , BMI Body mass index is 39.82 kg/m.  Wt Readings from Last 3 Encounters:  06/02/20 232 lb (105.2 kg)  05/26/20 232 lb 6.4 oz (105.4 kg)  05/12/20 238 lb 3.2 oz (108 kg)  GEN: Well nourished, well developed, in no acute distress. HEENT: normal. Neck: Supple, no JVD, carotid bruits, or masses. Cardiac: RRR, mechanical valve click noted, no murmurs, rubs, or gallops. No clubbing, cyanosis, edema.  Radials/PT 2+ and equal bilaterally.  Respiratory:  Respirations regular and unlabored, clear to auscultation bilaterally. GI: Soft, nontender, nondistended. MS: No deformity or atrophy. Skin: Warm and dry, no rash.  Midsternal incision clean, dry, intact with well approximated edges and no exudate.  Chest tube sutures with superficial dehiscence and no signs of infection appropriately covered with dry gauze. Neuro:  Strength and sensation are intact. Psych: Normal affect.  Assessment & Plan    1.  Severe MR s/p mechanical MVR - Doing overall well since [name] discharge. Was seen by TCTS PA last week and sees Dr. Roxy Manns in  2 weeks. Reports some mild dyspnea and chest tightness with more than usual activity which is overall improving.  She has been walking for about 10 to 15 minutes twice per day.  Encouraged participation in cardiac rehab, referral previously placed.  Chest tube sites with superficial dehiscence and no exudate, encouraged nonstick gauze pad and tape. No evidence of cellulitis.  Continue present dose of torsemide 40 mg twice daily, refill provided.  Anticipate that as her mitral valve has been replaced we eventually may be able to reduce her diuretic regimen.  2. HLD -continue atorvastatin 80 mg daily.  Refill provided.  Lipid-lowering diet  encouraged.  Consider lipid panel at follow-up.  3. DM2 - A1c 6.5 on 04/25/20.  She has not been diabetic in the past and is not on diabetic medications.  Encouraged to follow-up with her primary care provider.  We discussed that her weight loss, exercise, improved eating habits would help lower her A1c.  Handout on diabetic diet provided.  4. HFpEF -euvolemic and well compensated on exam.  Weight is trending down.  Continue torsemide 40 mg twice daily, spironolactone 12.5 mg daily, potassium supplement. Refill provided.  CMP, BNP today.  5. PAF s/p MAZE -maintaining normal sinus rhythm by EKG today.  Denies palpitations.  Anticoagulated with warfarin due to mechanical heart valve.  Continue metoprolol tartrate 25 mg twice daily, refill provided.  6. Chronic anticoagulation -secondary to mechanical valve.  Continue warfarin.  Denies bleeding complications.  7. CKD - Careful titration of diuretic and antihypertensive. BMP today for monitoring.   8. Hypokalemia - CMP today for monitoring. Continue potassium at present dose - she is taking 57mq twice daily. Will provide refills pending recommendations based on lab work.   9. Hyponatremia - During recent hospitalization. BMP today for monitoring. Recommend fluid restriction <2L per day.   10. Anemia - CBC today for monitoring. Denies bleeding.   11. HTN -BP reasonably well controlled today.  Continue present antihypertensive regimen including amlodipine 10 mg daily, torsemide 40 mg twice daily, spironolactone 12.5, Lopressor 25 mg twice daily.  If BP becomes uncontrolled consider addition of ARB.  Disposition: Follow up in June as scheduled with Dr. GRockey Situ Signed, CLoel Dubonnet NP 06/02/2020, 11:08 AM [place]

## 2020-06-02 NOTE — Patient Instructions (Addendum)
Medication Instructions:  Continue your current medications.   We will consider changing your potassium dose based on lab work.   *If you need a refill on your cardiac medications before your next appointment, please call your pharmacy*   Lab Work: Your provider recommends lab work today: CMP, CBC, BNP  If you have labs (blood work) drawn today and your tests are completely normal, you will receive your results only by: Marland Kitchen MyChart Message (if you have MyChart) OR . A paper copy in the mail If you have any lab test that is abnormal or we need to change your treatment, we will call you to review the results.  Testing/Procedures: Your EKG today showed normal sinus rhythm which is a good result!  Follow-Up: At [facility], you and your health needs are our priority.  As part of our continuing mission to provide you with exceptional heart care, we have created designated Provider Care Teams.  These Care Teams include your primary Cardiologist (physician) and Advanced Practice Providers (APPs -  Physician Assistants and Nurse Practitioners) who all work together to provide you with the care you need, when you need it.  We recommend signing up for the patient portal called "MyChart".  Sign up information is provided on this After Visit Summary.  MyChart is used to connect with patients for Virtual Visits (Telemedicine).  Patients are able to view lab/test results, encounter notes, upcoming appointments, etc.  Non-urgent messages can be sent to your provider as well.   To learn more about what you can do with MyChart, go to [email or web address].    Your next appointment:   As scheduled with Dr. Rockey Situ  Other Instructions  You have been referred to cardiac rehab. This is a combination program including monitored exercise, dietary education, and support group. We strongly recommend participating in the program. Expect a phone call from them in approximately 2 weeks. If you do not hear from  them, the phone number for cardiac rehab at [facility] is [phone].    Heart Healthy Diet Recommendations: A low-salt diet is recommended. Meats should be grilled, baked, or boiled. Avoid fried foods. Focus on lean protein sources like fish or chicken with vegetables and fruits. The American Heart Association is a Microbiologist!  American Heart Association Diet and Lifeystyle Recommendations   Exercise recommendations: The American Heart Association recommends 150 minutes of moderate intensity exercise weekly. Try 30 minutes of moderate intensity exercise 4-5 times per week. This could include walking, jogging, or swimming.   Diabetes Mellitus and Nutrition, Adult When you have diabetes, or diabetes mellitus, it is very important to have healthy eating habits because your blood sugar (glucose) levels are greatly affected by what you eat and drink. Eating healthy foods in the right amounts, at about the same times every day, can help you:  Control your blood glucose.  Lower your risk of heart disease.  Improve your blood pressure.  Reach or maintain a healthy weight. What can affect my meal plan? Every person with diabetes is different, and each person has different needs for a meal plan. Your health care provider may recommend that you work with a dietitian to make a meal plan that is best for you. Your meal plan may vary depending on factors such as:  The calories you need.  The medicines you take.  Your weight.  Your blood glucose, blood pressure, and cholesterol levels.  Your activity level.  Other health conditions you have, such as heart or kidney disease. How  do carbohydrates affect me? Carbohydrates, also called carbs, affect your blood glucose level more than any other type of food. Eating carbs naturally raises the amount of glucose in your blood. Carb counting is a method for keeping track of how many carbs you eat. Counting carbs is important to keep your blood glucose  at a healthy level, especially if you use insulin or take certain oral diabetes medicines. It is important to know how many carbs you can safely have in each meal. This is different for every person. Your dietitian can help you calculate how many carbs you should have at each meal and for each snack. How does alcohol affect me? Alcohol can cause a sudden decrease in blood glucose (hypoglycemia), especially if you use insulin or take certain oral diabetes medicines. Hypoglycemia can be a life-threatening condition. Symptoms of hypoglycemia, such as sleepiness, dizziness, and confusion, are similar to symptoms of having too much alcohol.  Do not drink alcohol if: ? Your health care provider tells you not to drink. ? You are pregnant, may be pregnant, or are planning to become pregnant.  If you drink alcohol: ? Do not drink on an empty stomach. ? Limit how much you use to:  0-1 drink a day for women.  0-2 drinks a day for men. ? Be aware of how much alcohol is in your drink. In the [place] one drink equals one 12 oz bottle of beer (355 mL), one 5 oz glass of wine (148 mL), or one 1 oz glass of hard liquor (44 mL). ? Keep yourself hydrated with water, diet soda, or unsweetened iced tea.  Keep in mind that regular soda, juice, and other mixers may contain a lot of sugar and must be counted as carbs. What are tips for following this plan? Reading food labels  Start by checking the serving size on the "Nutrition Facts" label of packaged foods and drinks. The amount of calories, carbs, fats, and other nutrients listed on the label is based on one serving of the item. Many items contain more than one serving per package.  Check the total grams (g) of carbs in one serving. You can calculate the number of servings of carbs in one serving by dividing the total carbs by 15. For example, if a food has 30 g of total carbs per serving, it would be equal to 2 servings of carbs.  Check the number of grams  (g) of saturated fats and trans fats in one serving. Choose foods that have a low amount or none of these fats.  Check the number of milligrams (mg) of salt (sodium) in one serving. Most people should limit total sodium intake to less than 2,300 mg per day.  Always check the nutrition information of foods labeled as "low-fat" or "nonfat." These foods may be higher in added sugar or refined carbs and should be avoided.  Talk to your dietitian to identify your daily goals for nutrients listed on the label. Shopping  Avoid buying canned, pre-made, or processed foods. These foods tend to be high in fat, sodium, and added sugar.  Shop around the outside edge of the grocery store. This is where you will most often find fresh fruits and vegetables, bulk grains, fresh meats, and fresh dairy. Cooking  Use low-heat cooking methods, such as baking, instead of high-heat cooking methods like deep frying.  Cook using healthy oils, such as olive, canola, or sunflower oil.  Avoid cooking with butter, cream, or high-fat meats. Meal planning  Eat meals and snacks regularly, preferably at the same times every day. Avoid going long periods of time without eating.  Eat foods that are high in fiber, such as fresh fruits, vegetables, beans, and whole grains. Talk with your dietitian about how many servings of carbs you can eat at each meal.  Eat 4-6 oz (112-168 g) of lean protein each day, such as lean meat, chicken, fish, eggs, or tofu. One ounce (oz) of lean protein is equal to: ? 1 oz (28 g) of meat, chicken, or fish. ? 1 egg. ?  cup (62 g) of tofu.  Eat some foods each day that contain healthy fats, such as avocado, nuts, seeds, and fish.   What foods should I eat? Fruits Berries. Apples. Oranges. Peaches. Apricots. Plums. Grapes. Mango. Papaya. Pomegranate. Kiwi. Cherries. Vegetables Lettuce. Spinach. Leafy greens, including kale, chard, collard greens, and mustard greens. Beets. Cauliflower.  Cabbage. Broccoli. Carrots. Green beans. Tomatoes. Peppers. Onions. Cucumbers. Brussels sprouts. Grains Whole grains, such as whole-wheat or whole-grain bread, crackers, tortillas, cereal, and pasta. Unsweetened oatmeal. Quinoa. Brown or wild rice. Meats and other proteins Seafood. Poultry without skin. Lean cuts of poultry and beef. Tofu. Nuts. Seeds. Dairy Low-fat or fat-free dairy products such as milk, yogurt, and cheese. The items listed above may not be a complete list of foods and beverages you can eat. Contact a dietitian for more information. What foods should I avoid? Fruits Fruits canned with syrup. Vegetables Canned vegetables. Frozen vegetables with butter or cream sauce. Grains Refined white flour and flour products such as bread, pasta, snack foods, and cereals. Avoid all processed foods. Meats and other proteins Fatty cuts of meat. Poultry with skin. Breaded or fried meats. Processed meat. Avoid saturated fats. Dairy Full-fat yogurt, cheese, or milk. Beverages Sweetened drinks, such as soda or iced tea. The items listed above may not be a complete list of foods and beverages you should avoid. Contact a dietitian for more information. Questions to ask a health care provider  Do I need to meet with a diabetes educator?  Do I need to meet with a dietitian?  What number can I call if I have questions?  When are the best times to check my blood glucose? Where to find more information:  American Diabetes Association: diabetes.org  Academy of Nutrition and Dietetics: www.eatright.[employer] of Diabetes and Digestive and Kidney Diseases: [employer]  Association of Diabetes Care and Education Specialists: www.diabeteseducator.org Summary  It is important to have healthy eating habits because your blood sugar (glucose) levels are greatly affected by what you eat and drink.  A healthy meal plan will help you control your blood glucose and maintain  a healthy lifestyle.  Your health care provider may recommend that you work with a dietitian to make a meal plan that is best for you.  Keep in mind that carbohydrates (carbs) and alcohol have immediate effects on your blood glucose levels. It is important to count carbs and to use alcohol carefully. This information is not intended to replace advice given to you by your health care provider. Make sure you discuss any questions you have with your health care provider. Document Revised: 12/12/2018 Document Reviewed: 12/12/2018 Elsevier Patient Education  2021 [employer].

## 2020-06-03 LAB — COMPREHENSIVE METABOLIC PANEL
ALT: 18 IU/L (ref 0–32)
AST: 24 IU/L (ref 0–40)
Albumin/Globulin Ratio: 1.1 — ABNORMAL LOW (ref 1.2–2.2)
Albumin: 4.5 g/dL (ref 3.8–4.9)
Alkaline Phosphatase: 199 IU/L — ABNORMAL HIGH (ref 44–121)
BUN/Creatinine Ratio: 9 (ref 9–23)
BUN: 12 mg/dL (ref 6–24)
Bilirubin Total: 0.3 mg/dL (ref 0.0–1.2)
CO2: 25 mmol/L (ref 20–29)
Calcium: 9.5 mg/dL (ref 8.7–10.2)
Chloride: 95 mmol/L — ABNORMAL LOW (ref 96–106)
Creatinine, Ser: 1.32 mg/dL — ABNORMAL HIGH (ref 0.57–1.00)
Globulin, Total: 4 g/dL (ref 1.5–4.5)
Glucose: 90 mg/dL (ref 65–99)
Potassium: 3.9 mmol/L (ref 3.5–5.2)
Sodium: 140 mmol/L (ref 134–144)
Total Protein: 8.5 g/dL (ref 6.0–8.5)
eGFR: 48 mL/min/{1.73_m2} — ABNORMAL LOW (ref >59)

## 2020-06-03 LAB — CBC
Hematocrit: 34.4 % (ref 34.0–46.6)
Hemoglobin: 11.5 g/dL (ref 11.1–15.9)
MCH: 28.7 pg (ref 26.6–33.0)
MCHC: 33.4 g/dL (ref 31.5–35.7)
MCV: 86 fL (ref 79–97)
Platelets: 414 10*3/uL (ref 150–450)
RBC: 4.01 x10E6/uL (ref 3.77–5.28)
RDW: 13.4 % (ref 11.7–15.4)
WBC: 7.5 10*3/uL (ref 3.4–10.8)

## 2020-06-03 LAB — BRAIN NATRIURETIC PEPTIDE: BNP: 116.6 pg/mL — ABNORMAL HIGH (ref 0.0–100.0)

## 2020-06-04 ENCOUNTER — Other Ambulatory Visit: Payer: Self-pay | Admitting: Family

## 2020-06-04 ENCOUNTER — Other Ambulatory Visit: Payer: Self-pay

## 2020-06-04 ENCOUNTER — Telehealth: Payer: Self-pay | Admitting: *Deleted

## 2020-06-04 ENCOUNTER — Ambulatory Visit (INDEPENDENT_AMBULATORY_CARE_PROVIDER_SITE_OTHER): Payer: 59

## 2020-06-04 ENCOUNTER — Other Ambulatory Visit
Admission: RE | Admit: 2020-06-04 | Discharge: 2020-06-04 | Disposition: A | Discharge status: Home / Self Care | Payer: 59 | Source: Ambulatory Visit | Attending: Internal Medicine | Admitting: Internal Medicine

## 2020-06-04 ENCOUNTER — Other Ambulatory Visit: Payer: Self-pay | Admitting: Nurse Practitioner

## 2020-06-04 DIAGNOSIS — R791 Abnormal coagulation profile: Secondary | ICD-10-CM

## 2020-06-04 DIAGNOSIS — I050 Rheumatic mitral stenosis: Secondary | ICD-10-CM

## 2020-06-04 DIAGNOSIS — I4891 Unspecified atrial fibrillation: Secondary | ICD-10-CM | POA: Diagnosis present

## 2020-06-04 DIAGNOSIS — Z5181 Encounter for therapeutic drug level monitoring: Secondary | ICD-10-CM | POA: Diagnosis present

## 2020-06-04 DIAGNOSIS — E785 Hyperlipidemia, unspecified: Secondary | ICD-10-CM

## 2020-06-04 DIAGNOSIS — K219 Gastro-esophageal reflux disease without esophagitis: Secondary | ICD-10-CM

## 2020-06-04 LAB — POCT INR
INR: 8 — AB (ref 2.0–3.0)
INR: 8.5 — AB (ref 2.0–3.0)

## 2020-06-04 LAB — PROTIME-INR
INR: 8.5 (ref 0.8–1.2)
Prothrombin Time: 70.5 seconds — ABNORMAL HIGH (ref 11.4–15.2)

## 2020-06-04 MED ORDER — POTASSIUM CHLORIDE CRYS ER 20 MEQ PO TBCR: 20.0000 meq | EXTENDED_RELEASE_TABLET | Freq: Two times a day (BID) | ORAL | 5 refills | Status: DC

## 2020-06-04 NOTE — Telephone Encounter (Signed)
-----   Message from Loel Dubonnet, NP sent at 06/03/2020  1:45 PM EDT ----- CBC shows blood counts have normalized since hospital discharge. Good result! Kidney function continues to improve. Normal liver enzymes with mildly elevated alkaline phosphatase - recommend avoidance of alcohol and fried foods as you have been. BNP shows only very mild fluid elevation. Recommend continuing Torsemide 40mg  twice daily and Potassium 49mEq twice daily.  Care team - please send refill of Potassium 65mEq tablets to [facility] as she only has 54mEq tablets at home.

## 2020-06-04 NOTE — Patient Instructions (Addendum)
Spoke w/ pt and advised her to  - have a large serving of greens - skip warfarin today, tomorrow and Friday, & Saturday - on Sunday, START NEW DOSAGE of 1/2 tablet every day - Recheck INR next week

## 2020-06-04 NOTE — Telephone Encounter (Signed)
Spoke to pt. Notified of lab results and provider's recc.  Pt verbalized understanding.  She will continue Torsemide 40mg  twice daily and Potassium 20mEq twice daily. I have sent in refills for Potassium 23mEq tablet Twice daily.  Pt has no questions at this time.

## 2020-06-05 ENCOUNTER — Other Ambulatory Visit: Payer: Self-pay | Admitting: Family

## 2020-06-06 ENCOUNTER — Ambulatory Visit (INDEPENDENT_AMBULATORY_CARE_PROVIDER_SITE_OTHER): Payer: 59 | Admitting: Physician Assistant

## 2020-06-06 ENCOUNTER — Encounter: Payer: Self-pay | Admitting: Physician Assistant

## 2020-06-06 ENCOUNTER — Other Ambulatory Visit: Payer: Self-pay

## 2020-06-06 DIAGNOSIS — R7303 Prediabetes: Secondary | ICD-10-CM | POA: Diagnosis not present

## 2020-06-06 DIAGNOSIS — R413 Other amnesia: Secondary | ICD-10-CM

## 2020-06-06 DIAGNOSIS — R5383 Other fatigue: Secondary | ICD-10-CM

## 2020-06-06 DIAGNOSIS — Z954 Presence of other heart-valve replacement: Secondary | ICD-10-CM | POA: Diagnosis not present

## 2020-06-06 DIAGNOSIS — I480 Paroxysmal atrial fibrillation: Secondary | ICD-10-CM

## 2020-06-06 DIAGNOSIS — M62838 Other muscle spasm: Secondary | ICD-10-CM | POA: Diagnosis not present

## 2020-06-06 DIAGNOSIS — N1832 Chronic kidney disease, stage 3b: Secondary | ICD-10-CM

## 2020-06-06 LAB — POCT GLYCOSYLATED HEMOGLOBIN (HGB A1C): Hemoglobin A1C: 5.8 % — AB (ref 4.0–5.6)

## 2020-06-06 MED ORDER — CYCLOBENZAPRINE HCL 10 MG PO TABS: 10.0000 mg | ORAL_TABLET | Freq: Every day | ORAL | 0 refills | Status: DC

## 2020-06-06 NOTE — Progress Notes (Signed)
[facility], [place] [address]  Internal MEDICINE  Office Visit Note  Patient Name: Leslie [name]  [id number]  [id number]  Date of Service: 06/06/2020  Chief Complaint  Patient presents with  . Quality Metric Gaps    Mammogram, rt arm pain     HPI Pt is here for follow up. -She is s/p MVR and is having poor healing of her abdominal surgical sites. She was told to check on her sugar levels due to poor healing. The rest of the lab work done by cardiology was good with CBC returning to normal, kidney function improving and mild BNP elevation so continuing torsemide with potassium. -Sees the surgeon Dr. Roxy Manns June 1st for follow up -Has noted some memory loss recently since surgery--more forgetful since anesthesia. Will get B12 and thyroid labs done. -INR was 8.6 was told to hold for 4 days. Then will restart the half tablet Sunday. -Right arm/neck pain for the past few days and now has not slept for the past 2 days. Feels tight and hard to turn head to left completely. Think she may have slept on it wrong.  Current Medication: Outpatient Encounter Medications as of 06/06/2020  Medication Sig  . acetaminophen (TYLENOL) 500 MG tablet Take 1,000 mg by mouth every 6 (six) hours as needed for moderate pain.  Marland Kitchen albuterol (VENTOLIN HFA) 108 (90 Base) MCG/ACT inhaler Inhale 2 puffs into the lungs every 6 (six) hours as needed for wheezing or shortness of breath.  Marland Kitchen amLODipine (NORVASC) 10 MG tablet Take 1 tablet (10 mg total) by mouth daily.  Marland Kitchen amoxicillin (AMOXIL) 500 MG tablet Take 2,000 mg by mouth See admin instructions. Take 4 capsules (2000 mg) by mouth 1 hour prior to dental work  . aspirin EC 81 MG tablet Take 1 tablet (81 mg total) by mouth daily. Swallow whole.  Marland Kitchen atorvastatin (LIPITOR) 80 MG tablet TAKE 1 TABLET BY MOUTH EVERY DAY AT 6 PM  . calcium carbonate (TUMS - DOSED IN MG ELEMENTAL CALCIUM) 500 MG chewable tablet Chew 500 mg by mouth daily as  needed for indigestion or heartburn.  . cyclobenzaprine (FLEXERIL) 10 MG tablet Take 1 tablet (10 mg total) by mouth at bedtime. Take one tab po qhs for back spasm prn only  . ferrous [id number] C-folic acid (TRINSICON / FOLTRIN) capsule Take 1 capsule by mouth 2 (two) times daily after a meal.  . fluticasone (FLONASE) 50 MCG/ACT nasal spray Place 1 spray into both nostrils daily as needed for rhinitis.  . metoprolol tartrate (LOPRESSOR) 25 MG tablet Take 1 tablet (25 mg total) by mouth 2 (two) times daily.  . potassium chloride SA (KLOR-CON) 20 MEQ tablet Take 1 tablet (20 mEq total) by mouth 2 (two) times daily.  Marland Kitchen spironolactone (ALDACTONE) 25 MG tablet Take 0.5 tablets (12.5 mg total) by mouth daily.  Marland Kitchen torsemide (DEMADEX) 20 MG tablet Take 2 tablets (40 mg total) by mouth 2 (two) times daily.  Marland Kitchen warfarin (COUMADIN) 5 MG tablet Take 1 tablet (5 mg total) by mouth daily at 4 PM. As instructed by Coumadin clinic for lifetime   No facility-administered encounter medications on file as of 06/06/2020.    Surgical History: Past Surgical History:  Procedure Laterality Date  . ABDOMINAL HYSTERECTOMY     total  . ABDOMINAL HYSTERECTOMY    . BREAST BIOPSY Bilateral 2012  . BREAST BIOPSY Right 08-07-12   fibroadenomatous changes and columnar cells  . BREAST BIOPSY  02/03/2015  stereo byrnett  . BUBBLE STUDY  04/01/2020   Procedure: BUBBLE STUDY;  Surgeon: [name], MD;  Location: [facility];  Service: Cardiovascular;;  . CARDIAC CATHETERIZATION    . CHOLECYSTECTOMY N/A 02/27/2016   Procedure: LAPAROSCOPIC CHOLECYSTECTOMY;  Surgeon: Christene Lye, MD;  Location: [facility];  Service: General;  Laterality: N/A;  . CLIPPING OF ATRIAL APPENDAGE  04/29/2020   Procedure: CLIPPING OF ATRIAL APPENDAGE USING ATRICURE  PRO2 CLIP SIZE 45MM;  Surgeon: Rexene Alberts, MD;  Location: [facility] OR;  Service: Open Heart Surgery;;  . COLONOSCOPY WITH PROPOFOL N/A 09/26/2018   Procedure:  COLONOSCOPY WITH PROPOFOL;  Surgeon: Lucilla Lame, MD;  Location: [facility] ENDOSCOPY;  Service: Endoscopy;  Laterality: N/A;  . CYSTOSCOPY W/ RETROGRADES Right 08/18/2018   Procedure: CYSTOSCOPY WITH RETROGRADE PYELOGRAM;  Surgeon: Billey Co, MD;  Location: [facility];  Service: Urology;  Laterality: Right;  . CYSTOSCOPY/URETEROSCOPY/HOLMIUM LASER/STENT PLACEMENT Right 08/18/2018   Procedure: CYSTOSCOPY/URETEROSCOPY/STENT PLACEMENT;  Surgeon: Billey Co, MD;  Location: [facility];  Service: Urology;  Laterality: Right;  . DIAGNOSTIC LAPAROSCOPY    . ESOPHAGOGASTRODUODENOSCOPY (EGD) WITH PROPOFOL N/A 09/26/2018   Procedure: ESOPHAGOGASTRODUODENOSCOPY (EGD) WITH PROPOFOL;  Surgeon: Lucilla Lame, MD;  [facility];  Service: Endoscopy;  Laterality: N/A;  . GIVENS CAPSULE STUDY N/A 11/03/2018   Procedure: GIVENS CAPSULE STUDY;  Surgeon: Lucilla Lame, MD;  Location: [facility] ENDOSCOPY;  Service: Endoscopy;  Laterality: N/A;  . JOINT REPLACEMENT Left    knee  . KNEE ARTHROPLASTY Right 04/09/2019   Procedure: COMPUTER ASSISTED TOTAL KNEE ARTHROPLASTY;  Surgeon: Dereck Leep, MD;  Location: [facility];  Service: Orthopedics;  Laterality: Right;  . KNEE CLOSED REDUCTION Left 04/15/2015   Procedure: CLOSED MANIPULATION KNEE;  Surgeon: Thornton Park, MD;  Location: [facility];  Service: Orthopedics;  Laterality: Left;  . KNEE SURGERY    . MAZE N/A 04/29/2020   Procedure: MAZE;  Surgeon: Rexene Alberts, MD;  Location: [place];  Service: Open Heart Surgery;  Laterality: N/A;  . MITRAL VALVE REPLACEMENT N/A 04/29/2020   Procedure: MITRAL VALVE (MV) REPLACEMENT USING ON-X VALVE SIZE 27/29MM;  Surgeon: Rexene Alberts, MD;  Location: [place];  Service: Open Heart Surgery;  Laterality: N/A;  . MULTIPLE EXTRACTIONS WITH ALVEOLOPLASTY N/A 02/28/2020   Procedure: MULTIPLE EXTRACTION WITH ALVEOLOPLASTY;  Surgeon: [name], DMD;  Location: [place];  Service: Dentistry;  Laterality: N/A;  . RIGHT/LEFT  HEART CATH AND CORONARY ANGIOGRAPHY Bilateral 09/06/2019   Procedure: RIGHT/LEFT HEART CATH AND CORONARY ANGIOGRAPHY;  Surgeon: Minna Merritts, MD;  Location: [place];  Service: Cardiovascular;  Laterality: Bilateral;  . TEE WITHOUT CARDIOVERSION N/A 09/13/2017   Procedure: TRANSESOPHAGEAL ECHOCARDIOGRAM (TEE);  Surgeon: Nelva Bush, MD;  Location: [facility];  Service: Cardiovascular;  Laterality: N/A;  . TEE WITHOUT CARDIOVERSION N/A 04/01/2020   Procedure: TRANSESOPHAGEAL ECHOCARDIOGRAM (TEE);  Surgeon: [name], MD;  Location: [facility] ENDOSCOPY;  Service: Cardiovascular;  Laterality: N/A;  . TEE WITHOUT CARDIOVERSION N/A 04/29/2020   Procedure: TRANSESOPHAGEAL ECHOCARDIOGRAM (TEE);  Surgeon: Rexene Alberts, MD;  Location: [place];  Service: Open Heart Surgery;  Laterality: N/A;  . TOTAL KNEE ARTHROPLASTY Left 12/25/2014   Procedure: TOTAL KNEE ARTHROPLASTY;  Surgeon: Thornton Park, MD;  Location: [facility];  Service: Orthopedics;  Laterality: Left;  . TUBAL LIGATION      Medical History: Past Medical History:  Diagnosis Date  . (HFpEF) heart failure with preserved ejection fraction ([place])    a. 08/2017  Echo: EF 55-60%.  Grade 2 diastolic dysfunction.  Moderate mitral stenosis.  . Allergy   . Anemia   . Anxiety   . BRCA negative 03/22/2013  . Bronchitis 02/19/2016   ON LEVAQUIN PO  . Chest tightness   . CHF (congestive heart failure) ([place])   . Cigarette smoker 09/11/2017   8-10 day  . Dyspnea   . Dysrhythmia   . GERD (gastroesophageal reflux disease)   . Heart murmur    Per pt. dx by Dr. Roxy Manns via [place]  . History of kidney stones   . Hyperlipidemia   . Hypertension   . Interstitial lung disease ([place])    a. CT 2013 b. 02/2018 CXR noted recurrent intersistial changes ILD vs chronic bronchitis  . Moderate mitral stenosis    a.  08/2017 TEE: EF 60 to 65%.  Moderate mitral stenosis.  Mean gradient 14 mmHg.  Valve area 2.59 cm by planimetry, 2.72 cm by  pressure half-time.  . Persistent atrial fibrillation ([place])    a. 08/2017 s/p TEE/DCCV; b. CHA2DS2VASc = 2-->warfarin.  . Pneumonia   . S/P Maze operation for atrial fibrillation 04/29/2020   Complete bilateral atrial lesion set using bipolar radiofrequency and cryothermy ablation with clipping of LA appendage  . S/P mitral valve replacement with Onyx bileaflet mechanical valve 04/29/2020   27/29 mm Onyx-Valve    Family History: Family History  Problem Relation Age of Onset  . Cancer Mother 22       breast  . Hypertension Mother   . Cancer Maternal Aunt        breast  . Cancer Maternal Grandmother        breast  . Osteoarthritis Father   . Hypertension Father     Social History   Socioeconomic History  . Marital status: Married    Spouse name: Not on file  . Number of children: Not on file  . Years of education: Not on file  . Highest education level: Not on file  Occupational History  . Not on file  Tobacco Use  . Smoking status: Former Smoker    Packs/day: 0.50    Years: 20.00    Pack years: 10.00    Types: Cigarettes    Quit date: 02/20/2018    Years since quitting: 2.2  . Smokeless tobacco: Never Used  Vaping Use  . Vaping Use: Never used  Substance and Sexual Activity  . Alcohol use: Yes    Comment: rarely  . Drug use: No  . Sexual activity: Not on file  Other Topics Concern  . Not on file  Social History Narrative  . Not on file   Social Determinants of Health   Financial Resource Strain: Low Risk   . Difficulty of Paying Living Expenses: Not very hard  Food Insecurity: No Food Insecurity  . Worried About Charity fundraiser in the Last Year: Never true  . Ran Out of Food in the Last Year: Never true  Transportation Needs: No Transportation Needs  . Lack of Transportation (Medical): No  . Lack of Transportation (Non-Medical): No  Physical Activity: Not on file  Stress: Not on file  Social Connections: Not on file  Intimate Partner Violence: Not on  file      Review of Systems  Constitutional: Negative for chills, fatigue and unexpected weight change.  HENT: Negative for congestion, postnasal drip, rhinorrhea, sneezing and sore throat.   Eyes: Negative for redness.  Respiratory: Negative for cough, chest tightness and shortness  of breath.   Cardiovascular: Negative for chest pain and palpitations.  Gastrointestinal: Negative for abdominal pain, constipation, diarrhea, nausea and vomiting.  Genitourinary: Negative for dysuria and frequency.  Musculoskeletal: Positive for myalgias and neck stiffness. Negative for arthralgias, back pain, joint swelling and neck pain.       Neck stiff and pain along right side of neck into arm  Skin: Positive for wound. Negative for rash.       Abdominal surgical sites still not fully healed--bandaged per surgical office recommendations  Neurological: Negative.  Negative for tremors and numbness.  Hematological: Negative for adenopathy. Does not bruise/bleed easily.  Psychiatric/Behavioral: Negative for behavioral problems (Depression), sleep disturbance and suicidal ideas. The patient is not nervous/anxious.        More forgetful lately    Vital Signs: BP 138/72   Pulse 97   Temp 98.5 F (36.9 C)   Resp 16   Ht '5\' 4"'  (1.626 m)   Wt 233 lb 9.6 oz (106 kg)   SpO2 99%   BMI 40.10 kg/m    Physical Exam Vitals and nursing note reviewed.  Constitutional:      General: She is not in acute distress.    Appearance: She is well-developed. She is obese. She is not diaphoretic.  HENT:     Head: Normocephalic and atraumatic.     Mouth/Throat:     Pharynx: No oropharyngeal exudate.  Eyes:     Pupils: Pupils are equal, round, and reactive to light.  Neck:     Thyroid: No thyromegaly.     Vascular: No JVD.     Trachea: No tracheal deviation.     Comments: R side of neck is tense and sore to the touch down in to Right arm, limited ROM on turning to left Cardiovascular:     Rate and Rhythm:  Normal rate and regular rhythm.     Heart sounds: Normal heart sounds. No murmur heard. No friction rub. No gallop.   Pulmonary:     Effort: Pulmonary effort is normal. No respiratory distress.     Breath sounds: No wheezing or rales.  Chest:     Chest wall: No tenderness.  Abdominal:     General: Bowel sounds are normal.     Palpations: Abdomen is soft.  Musculoskeletal:        General: Normal range of motion.     Cervical back: Neck supple. Tenderness present.  Lymphadenopathy:     Cervical: No cervical adenopathy.  Skin:    General: Skin is warm and dry.  Neurological:     Mental Status: She is alert and oriented to person, place, and time.     Cranial Nerves: No cranial nerve deficit.  Psychiatric:        Behavior: Behavior normal.        Thought Content: Thought content normal.        Judgment: Judgment normal.        Assessment/Plan: 1. Neck muscle spasm Patient will start on Flexeril at night also recommended to do gentle stretching exercises and use a heating pad as needed - cyclobenzaprine (FLEXERIL) 10 MG tablet; Take 1 tablet (10 mg total) by mouth at bedtime. Take one tab po qhs for back spasm prn only  Dispense: 20 tablet; Refill: 0  2. Memory loss Some increased forgetfulness since undergoing anesthesia for mitral valve repair, we will go ahead and order lab work and consider doing a Mini-Mental status exam at next visit and consider imaging -  B12 and Folate Panel - TSH + free T4  3. Pre-diabetes - POCT HgB A1C is 5.8, A1c is just borderline into the prediabetic range therefore patient advised to monitor what she is eating and improve diet and exercise.  We will continue to monitor closely and consider initiation of medication if worsening  4. S/P mitral valve replacement with Onyx bileaflet mechanical valve Status post Maze procedure, patient followed by cardiology  5. Paroxysmal atrial fibrillation (HCC) On warfarin, but being held due to elevated INR  of 8.6 and patient was told to resume at half her dosage on Sunday, followed by cardiology  6. Stage 3b chronic kidney disease ([place]) Followed by nephrology recent labs showed improvement in kidney function  7. Other fatigue - B12 and Folate Panel - TSH + free T4   General Counseling: Zaidee verbalizes understanding of the findings of todays visit and agrees with plan of treatment. I have discussed any further diagnostic evaluation that may be needed or ordered today. We also reviewed her medications today. she has been encouraged to call the office with any questions or concerns that should arise related to todays visit.    Orders Placed This Encounter  Procedures  . B12 and Folate Panel  . TSH + free T4  . POCT HgB A1C    Meds ordered this encounter  Medications  . cyclobenzaprine (FLEXERIL) 10 MG tablet    Sig: Take 1 tablet (10 mg total) by mouth at bedtime. Take one tab po qhs for back spasm prn only    Dispense:  20 tablet    Refill:  0    This patient was seen by Drema Dallas, PA-C in collaboration with Dr. Clayborn Bigness as a part of collaborative care agreement.   Total time spent:40 Minutes Time spent includes review of chart, medications, test results, and follow up plan with the patient.      Dr Lavera Guise Internal medicine

## 2020-06-11 ENCOUNTER — Ambulatory Visit (INDEPENDENT_AMBULATORY_CARE_PROVIDER_SITE_OTHER): Payer: 59

## 2020-06-11 ENCOUNTER — Other Ambulatory Visit: Payer: Self-pay

## 2020-06-11 ENCOUNTER — Other Ambulatory Visit
Admission: RE | Admit: 2020-06-11 | Discharge: 2020-06-11 | Disposition: A | Discharge status: Home / Self Care | Payer: 59 | Attending: Internal Medicine | Admitting: Internal Medicine

## 2020-06-11 DIAGNOSIS — R791 Abnormal coagulation profile: Secondary | ICD-10-CM

## 2020-06-11 DIAGNOSIS — Z5181 Encounter for therapeutic drug level monitoring: Secondary | ICD-10-CM | POA: Diagnosis not present

## 2020-06-11 DIAGNOSIS — I050 Rheumatic mitral stenosis: Secondary | ICD-10-CM

## 2020-06-11 DIAGNOSIS — I4891 Unspecified atrial fibrillation: Secondary | ICD-10-CM | POA: Diagnosis not present

## 2020-06-11 DIAGNOSIS — I5032 Chronic diastolic (congestive) heart failure: Secondary | ICD-10-CM | POA: Diagnosis not present

## 2020-06-11 LAB — POCT INR: INR: 8 — AB (ref 2.0–3.0)

## 2020-06-11 LAB — PROTIME-INR
INR: 5.5 (ref 0.8–1.2)
Prothrombin Time: 49.8 seconds — ABNORMAL HIGH (ref 11.4–15.2)

## 2020-06-11 NOTE — Patient Instructions (Addendum)
-   Skip warfarin tonight and tomorrow, then - resume new dosage of 1/2 tablet every day - recheck as scheduled in 1 week

## 2020-06-12 ENCOUNTER — Ambulatory Visit (INDEPENDENT_AMBULATORY_CARE_PROVIDER_SITE_OTHER): Payer: 59

## 2020-06-12 DIAGNOSIS — I050 Rheumatic mitral stenosis: Secondary | ICD-10-CM | POA: Diagnosis not present

## 2020-06-12 DIAGNOSIS — Z954 Presence of other heart-valve replacement: Secondary | ICD-10-CM | POA: Diagnosis not present

## 2020-06-12 LAB — ECHOCARDIOGRAM COMPLETE
AR max vel: 1.87 cm2
AV Area VTI: 1.72 cm2
AV Area mean vel: 1.8 cm2
AV Mean grad: 5 mmHg
AV Peak grad: 10.1 mmHg
Ao pk vel: 1.59 m/s
Area-P 1/2: 4.83 cm2
MV VTI: 1.43 cm2

## 2020-06-12 MED ORDER — PERFLUTREN LIPID MICROSPHERE
1.0000 mL | INTRAVENOUS | Status: AC | PRN
  Administered 2020-06-12: 2 mL via INTRAVENOUS

## 2020-06-17 ENCOUNTER — Other Ambulatory Visit: Payer: Self-pay

## 2020-06-17 ENCOUNTER — Encounter: Payer: 59 | Attending: Cardiovascular Disease | Admitting: *Deleted

## 2020-06-17 ENCOUNTER — Encounter: Payer: Self-pay | Admitting: *Deleted

## 2020-06-17 DIAGNOSIS — Z952 Presence of prosthetic heart valve: Secondary | ICD-10-CM

## 2020-06-17 NOTE — Progress Notes (Signed)
Completed virtual orientation today.  EP evaluation is scheduled for Monday 6/6 at 230pm.  Documentation for diagnosis can be found in [facility] encounter 04/29/20.

## 2020-06-18 ENCOUNTER — Other Ambulatory Visit
Admission: RE | Admit: 2020-06-18 | Discharge: 2020-06-18 | Disposition: A | Discharge status: Home / Self Care | Payer: 59 | Source: Ambulatory Visit | Attending: Internal Medicine | Admitting: Internal Medicine

## 2020-06-18 ENCOUNTER — Ambulatory Visit (INDEPENDENT_AMBULATORY_CARE_PROVIDER_SITE_OTHER): Payer: 59

## 2020-06-18 ENCOUNTER — Other Ambulatory Visit: Payer: Self-pay | Admitting: Thoracic Surgery (Cardiothoracic Vascular Surgery)

## 2020-06-18 ENCOUNTER — Ambulatory Visit: Payer: 59 | Admitting: Thoracic Surgery (Cardiothoracic Vascular Surgery)

## 2020-06-18 ENCOUNTER — Telehealth: Payer: Self-pay

## 2020-06-18 DIAGNOSIS — I4891 Unspecified atrial fibrillation: Secondary | ICD-10-CM

## 2020-06-18 DIAGNOSIS — Z5181 Encounter for therapeutic drug level monitoring: Secondary | ICD-10-CM

## 2020-06-18 DIAGNOSIS — R791 Abnormal coagulation profile: Secondary | ICD-10-CM

## 2020-06-18 DIAGNOSIS — I050 Rheumatic mitral stenosis: Secondary | ICD-10-CM | POA: Insufficient documentation

## 2020-06-18 DIAGNOSIS — Z952 Presence of prosthetic heart valve: Secondary | ICD-10-CM

## 2020-06-18 LAB — PROTIME-INR
INR: 5.2 (ref 0.8–1.2)
Prothrombin Time: 47.9 seconds — ABNORMAL HIGH (ref 11.4–15.2)

## 2020-06-18 LAB — POCT INR: INR: 8 — AB (ref 2.0–3.0)

## 2020-06-18 MED ORDER — WARFARIN SODIUM 2 MG PO TABS: 2.0000 mg | ORAL_TABLET | Freq: Every day | ORAL | 1 refills | Status: DC

## 2020-06-18 NOTE — Telephone Encounter (Signed)
Norma at [facility] lab called and informed us that pt has a critical INR at 5.2/47.9.  I looked in chart and saw that she is managed by cardiology and I contacted Dede Query, RN and gave her the results.

## 2020-06-18 NOTE — Patient Instructions (Addendum)
-   skip warfarin tonight and tomorrow, then - START NEW DOSAGE of warfarin of 2 mg every day - Recheck as scheduled in 1 week

## 2020-06-19 ENCOUNTER — Other Ambulatory Visit: Payer: Self-pay | Admitting: Nurse Practitioner

## 2020-06-19 ENCOUNTER — Other Ambulatory Visit: Payer: Self-pay

## 2020-06-19 ENCOUNTER — Ambulatory Visit (INDEPENDENT_AMBULATORY_CARE_PROVIDER_SITE_OTHER): Payer: Self-pay | Admitting: Thoracic Surgery (Cardiothoracic Vascular Surgery)

## 2020-06-19 ENCOUNTER — Ambulatory Visit
Admission: RE | Admit: 2020-06-19 | Discharge: 2020-06-19 | Disposition: A | Discharge status: Home / Self Care | Payer: 59 | Source: Ambulatory Visit | Attending: Thoracic Surgery (Cardiothoracic Vascular Surgery) | Admitting: Thoracic Surgery (Cardiothoracic Vascular Surgery)

## 2020-06-19 ENCOUNTER — Encounter: Payer: Self-pay | Admitting: Thoracic Surgery (Cardiothoracic Vascular Surgery)

## 2020-06-19 VITALS — BP 150/85 | HR 110 | Temp 97.9°F | Resp 20 | Ht 64.0 in | Wt 231.0 lb

## 2020-06-19 DIAGNOSIS — B3731 Acute candidiasis of vulva and vagina: Secondary | ICD-10-CM

## 2020-06-19 DIAGNOSIS — Z9889 Other specified postprocedural states: Secondary | ICD-10-CM

## 2020-06-19 DIAGNOSIS — I480 Paroxysmal atrial fibrillation: Secondary | ICD-10-CM

## 2020-06-19 DIAGNOSIS — I5032 Chronic diastolic (congestive) heart failure: Secondary | ICD-10-CM

## 2020-06-19 DIAGNOSIS — I050 Rheumatic mitral stenosis: Secondary | ICD-10-CM

## 2020-06-19 DIAGNOSIS — Z952 Presence of prosthetic heart valve: Secondary | ICD-10-CM

## 2020-06-19 DIAGNOSIS — Z8679 Personal history of other diseases of the circulatory system: Secondary | ICD-10-CM

## 2020-06-19 DIAGNOSIS — Z954 Presence of other heart-valve replacement: Secondary | ICD-10-CM

## 2020-06-19 DIAGNOSIS — K219 Gastro-esophageal reflux disease without esophagitis: Secondary | ICD-10-CM

## 2020-06-19 DIAGNOSIS — B373 Candidiasis of vulva and vagina: Secondary | ICD-10-CM

## 2020-06-19 LAB — B12 AND FOLATE PANEL

## 2020-06-19 LAB — TSH+FREE T4
Free T4: 1.44 ng/dL (ref 0.82–1.77)
TSH: 0.222 u[IU]/mL — ABNORMAL LOW (ref 0.450–4.500)

## 2020-06-19 MED ORDER — METOPROLOL TARTRATE 25 MG PO TABS: 50.0000 mg | ORAL_TABLET | Freq: Two times a day (BID) | ORAL | 1 refills | Status: DC

## 2020-06-19 NOTE — Progress Notes (Signed)
[address]       [place] [address]             [phone]     CARDIOTHORACIC SURGERY OFFICE NOTE  Primary Cardiologist is Ida Rogue, MD PCP is Lavera Guise, MD   HPI:  Patient is 55 year old morbidly obese African-American female with history of rheumatic heart disease with severe mitral stenosis, chronic diastolic congestive heart failure, pulmonary hypertension, persistent atrial fibrillation currently maintaining sinus rhythm on long-term warfarin anticoagulation, hypertension, hyperlipidemia, GE reflux disease,kidney stones,and possible interstitial lung disease who returns to the office today for routine follow-up status post mitral valve replacement using and Onyx bileaflet mechanical valve and maze procedure on April 29, 2020.  The patient's early postoperative coverage in the hospital was notable for the development of acute oliguric renal failure which resolved with conservative measures and without need for temporary renal replacement therapy.  The patient ultimately was discharged home in sinus rhythm on the 12th postoperative day.  Since hospital discharge she was last seen in follow-up in our office on May 26, 2020 at which time she was doing fairly well.  Since then she has had problems getting her Coumadin management under control.  INR has been supratherapeutic on multiple occasions.  She has not yet been seen in follow-up by Dr. Rockey Situ but routine follow-up echocardiogram was performed Jun 12, 2020 which revealed normal left ventricular systolic function with normal functioning bileaflet mechanical valve in the mitral position and no pericardial effusion.  Patient returns her office today for further follow-up.  Overall she reports that she continues to gradually improve.  She states that she still gets short of breath with activity but this is slowly improving.  She has not had lower extremity edema and she is breathing comfortably when she sleeps  at night.  She does complain of palpitations and she states that her pulse typically is a little bit fast, often greater than 100 bpm.  She states that she can feel her chest pounding.  Appetite is good.  She otherwise is doing well although she does complain of persistent numbness in the left forearm and ulnar nerve distribution which has lasted ever since her surgical procedure.   Current Outpatient Medications  Medication Sig Dispense Refill  . acetaminophen (TYLENOL) 500 MG tablet Take 1,000 mg by mouth every 6 (six) hours as needed for moderate pain.    Marland Kitchen albuterol (VENTOLIN HFA) 108 (90 Base) MCG/ACT inhaler Inhale 2 puffs into the lungs every 6 (six) hours as needed for wheezing or shortness of breath. 8 g 2  . amLODipine (NORVASC) 10 MG tablet Take 1 tablet (10 mg total) by mouth daily. 90 tablet 1  . aspirin EC 81 MG tablet Take 1 tablet (81 mg total) by mouth daily. Swallow whole. 100 tablet 2  . atorvastatin (LIPITOR) 80 MG tablet TAKE 1 TABLET BY MOUTH EVERY DAY AT 6 PM 90 tablet 1  . calcium carbonate (TUMS - DOSED IN MG ELEMENTAL CALCIUM) 500 MG chewable tablet Chew 500 mg by mouth daily as needed for indigestion or heartburn.    . ferrous [id number] C-folic acid (TRINSICON / FOLTRIN) capsule Take 1 capsule by mouth 2 (two) times daily after a meal. 60 capsule 2  . fluticasone (FLONASE) 50 MCG/ACT nasal spray Place 1 spray into both nostrils daily as needed for rhinitis.    . metoprolol tartrate (LOPRESSOR) 25 MG tablet Take 1 tablet (25 mg total) by mouth 2 (two)  times daily. 180 tablet 1  . potassium chloride SA (KLOR-CON) 20 MEQ tablet Take 1 tablet (20 mEq total) by mouth 2 (two) times daily. 60 tablet 5  . spironolactone (ALDACTONE) 25 MG tablet Take 0.5 tablets (12.5 mg total) by mouth daily. 90 tablet 1  . torsemide (DEMADEX) 20 MG tablet Take 2 tablets (40 mg total) by mouth 2 (two) times daily. 180 tablet 1  . warfarin (COUMADIN) 2 MG tablet Take 1 tablet (2 mg  total) by mouth daily at 4 PM. As instructed by Coumadin clinic for lifetime 30 tablet 1  . amoxicillin (AMOXIL) 500 MG tablet Take 2,000 mg by mouth See admin instructions. Take 4 capsules (2000 mg) by mouth 1 hour prior to dental work (Patient not taking: Reported on 06/19/2020)     No current facility-administered medications for this visit.      Physical Exam:   BP (!) 150/85   Pulse (!) 110   Temp 97.9 F (36.6 C) (Skin)   Resp 20   Ht 5\' 4"  (1.626 m)   Wt 231 lb (104.8 kg)   SpO2 95% Comment: RA  BMI 39.65 kg/m   General:  Well-appearing  Chest:   Clear to auscultation  CV:   Regular rate rhythm with mechanical heart valve sounds  Incisions:  Healing nicely, sternum is stable  Abdomen:  Soft nontender  Extremities:  Warm and well-perfused, no edema  Diagnostic Tests:  ECHOCARDIOGRAM REPORT       Patient Name:  Leslie [name] Date of Exam: 06/12/2020  Medical Rec #: [id number]    Height:    64.0 in  Accession #:  [id number]   Weight:    233.6 lb  Date of Birth: 12-Jul-1965    BSA:     2.090 m  Patient Age:  4 years    BP:      130/70 mmHg  Patient Gender: F        HR:      92 bpm.  Exam Location: [facility]   Procedure: 2D Echo, Cardiac Doppler, Color Doppler and Intracardiac       Opacification Agent   Indications:  I34.9* Nonrheumatic mitral valve disorder, unspecified    History:    Patient has prior history of Echocardiogram examinations,  most         recent 05/02/2020. Mitral Valve Disease, Arrythmias:Atrial         Fibrillation; Risk Factors:Hypertension, Sleep Apnea and  Former         Smoker.           Mitral Valve: 27/29 Onyx mechanical valve valve is present  in         the mitral position. Procedure Date: 04/29/20.    Sonographer:  Pilar Jarvis RDMS, RVT, RDCS  Referring Phys: [place]    1. Left  ventricular ejection fraction, by estimation, is 50 to 55%. The  left ventricle has low normal function. The left ventricle has no regional  wall motion abnormalities. Left ventricular diastolic parameters are  indeterminate.  2. Right ventricular systolic function is low normal. The right  ventricular size is normal.  3. The mitral valve has been repaired/replaced. No evidence of mitral  valve regurgitation. The mean mitral valve gradient is 6.0 mmHg. There is  a 27/29 Onyx mechanical valve present in the mitral position. Procedure  Date: 04/29/20. Echo findings are  consistent with normal structure and function of the mitral valve  prosthesis.  4.  The aortic valve was not well visualized. Aortic valve regurgitation  is not visualized.  5. The inferior vena cava is normal in size with <50% respiratory  variability, suggesting right atrial pressure of 8 mmHg.   FINDINGS  Left Ventricle: Left ventricular ejection fraction, by estimation, is 50  to 55%. The left ventricle has low normal function. The left ventricle has  no regional wall motion abnormalities. Definity contrast agent was given  IV to delineate the left  ventricular endocardial borders. The left ventricular internal cavity size  was normal in size. There is no left ventricular hypertrophy. Left  ventricular diastolic parameters are indeterminate.   Right Ventricle: The right ventricular size is normal. No increase in  right ventricular wall thickness. Right ventricular systolic function is  low normal.   Left Atrium: Left atrial size was not well visualized.   Right Atrium: Right atrial size was normal in size.   Pericardium: There is no evidence of pericardial effusion.   Mitral Valve: The mitral valve has been repaired/replaced. No evidence of  mitral valve regurgitation. There is a 27/29 Onyx mechanical valve present  in the mitral position. Procedure Date: 04/29/20. Echo findings are  consistent with normal  structure and  function of the mitral valve prosthesis. MV peak gradient, 15.6 mmHg. The  mean mitral valve gradient is 6.0 mmHg.   Tricuspid Valve: The tricuspid valve is grossly normal. Tricuspid valve  regurgitation is not demonstrated.   Aortic Valve: The aortic valve was not well visualized. Aortic valve  regurgitation is not visualized. Aortic valve mean gradient measures 5.0  mmHg. Aortic valve peak gradient measures 10.1 mmHg. Aortic valve area, by  VTI measures 1.72 cm.   Pulmonic Valve: The pulmonic valve was not well visualized. Pulmonic valve  regurgitation is not visualized.   Aorta: The aortic root and ascending aorta are structurally normal, with  no evidence of dilitation.   Venous: The inferior vena cava is normal in size with less than 50%  respiratory variability, suggesting right atrial pressure of 8 mmHg.   IAS/Shunts: No atrial level shunt detected by color flow Doppler.     LEFT VENTRICLE  PLAX 2D  LVOT diam:   2.00 cm Diastology  LV SV:     50    LV e' medial:  6.42 cm/s  LV SV Index:  24    LV E/e' medial: 28.0  LVOT Area:   3.14 cm LV e' lateral:  8.16 cm/s             LV E/e' lateral: 22.1     RIGHT VENTRICLE      IVC  RV Basal diam: 3.40 cm  IVC diam: 1.90 cm  RV S prime:   6.64 cm/s  TAPSE (M-mode): 1.5 cm   LEFT ATRIUM     Index  LA diam:  4.35 cm 2.08 cm/m  AORTIC VALVE          PULMONIC VALVE  AV Area (Vmax):  1.87 cm   PV Vmax:    0.79 m/s  AV Area (Vmean):  1.80 cm   PV Peak grad: 2.5 mmHg  AV Area (VTI):   1.72 cm  AV Vmax:      159.00 cm/s  AV Vmean:     103.000 cm/s  AV VTI:      0.290 m  AV Peak Grad:   10.1 mmHg  AV Mean Grad:   5.0 mmHg  LVOT Vmax:     94.80 cm/s  LVOT  Vmean:    58.900 cm/s  LVOT VTI:     0.159 m  LVOT/AV VTI ratio: 0.55    AORTA  Ao Root diam: 2.90 cm  Ao Asc diam: 2.80 cm   MITRAL  VALVE        TRICUSPID VALVE  MV Area (PHT): 4.83 cm   TR Peak grad:  26.6 mmHg  MV Area VTI:  1.43 cm   TR Vmax:    258.00 cm/s  MV Peak grad: 15.6 mmHg  MV Mean grad: 6.0 mmHg   SHUNTS  MV Vmax:    1.98 m/s   Systemic VTI: 0.16 m  MV Vmean:   106.5 cm/s  Systemic Diam: 2.00 cm  MV Decel Time: 157 msec  MV E velocity: 180.00 cm/s   Kate Sable MD  Electronically signed by Kate Sable MD  Signature Date/Time: 06/12/2020/3:26:50 PM       CHEST - 2 VIEW  COMPARISON:  May 26, 2020  FINDINGS: The lungs are clear. The heart size and pulmonary vascularity are normal. No adenopathy. Patient is status post mitral valve replacement. There is a left atrial appendage clamp. There is aortic atherosclerosis. No bone lesions. No evident pneumothorax.  IMPRESSION: Status post mitral valve replacement with left atrial appendage clamp present. Heart size and pulmonary vascular normal. Lungs are clear. Aortic Atherosclerosis (ICD10-I70.0).   Electronically Signed   By: Lowella Grip III M.D.   On: 06/19/2020 09:44   2 channel telemetry rhythm strip demonstrates sinus tachycardia heart rate 110     Impression:  Patient is doing well approximately 6 weeks following mitral valve replacement with bileaflet mechanical prosthetic valve and maze procedure.  She is maintaining sinus rhythm although she has somewhat tachycardic.  She appears euvolemic on exam.  INR has been supratherapeutic.  I have personally reviewed the patient's recent follow-up echocardiogram which demonstrates normal left ventricular function, normal functioning bileaflet mechanical prosthetic valve in the mitral position, and no evidence of pericardial effusion.    Plan:  I have instructed the patient to increase her dose of metoprolol to 50 mg by mouth twice daily.  We will leave any further adjustments to her medical therapy to Dr. Rockey Situ discretion.  I have  encouraged the patient to continue to gradually increase her physical activity as tolerated with her primary limitation remaining that she refrain from heavy lifting using her arms or shoulders for at least another 6 to 8 weeks.  The patient has been reminded regarding the importance of dental hygiene and the lifelong need for antibiotic prophylaxis for all dental cleanings and other related invasive procedures.   We will refer her to Dr. Amedeo Plenty and has one of the most experienced and well respected hand specialists in the region for formal consultation due to the possibility of ulnar nerve compression or injury at the time of surgery.  The patient did have left brachial arterial line placed at the time of surgery.  The patient will continue to follow-up regularly with Dr. Rockey Situ.  She will call for follow-up appointment in this office in the future only should specific problems or questions arise.  All of her questions have been addressed.    Valentina Gu. Roxy Manns, MD 06/19/2020 11:04 AM

## 2020-06-19 NOTE — Patient Instructions (Signed)
Increase your dose of metoprolol to 50 mg by mouth twice daily  Continue all other previous medications without any changes at this time  You may resume unrestricted physical activity without any particular limitations at this time.  Endocarditis is a potentially serious infection of heart valves or inside lining of the heart.  It occurs more commonly in patients with diseased heart valves (such as patient's with aortic or mitral valve disease) and in patients who have undergone heart valve repair or replacement.  Certain surgical and dental procedures may put you at risk, such as dental cleaning, other dental procedures, or any surgery involving the respiratory, urinary, gastrointestinal tract, gallbladder or prostate gland.   To minimize your chances for develooping endocarditis, maintain good oral health and seek prompt medical attention for any infections involving the mouth, teeth, gums, skin or urinary tract.    Always notify your doctor or dentist about your underlying heart valve condition before having any invasive procedures. You will need to take antibiotics before certain procedures, including all routine dental cleanings or other dental procedures.  Your cardiologist or dentist should prescribe these antibiotics for you to be taken ahead of time.

## 2020-06-23 ENCOUNTER — Other Ambulatory Visit: Payer: Self-pay

## 2020-06-23 ENCOUNTER — Encounter: Payer: 59 | Attending: Cardiovascular Disease

## 2020-06-23 VITALS — Ht 65.0 in | Wt 232.1 lb

## 2020-06-23 DIAGNOSIS — Z952 Presence of prosthetic heart valve: Secondary | ICD-10-CM

## 2020-06-23 NOTE — Patient Instructions (Signed)
Patient Instructions  Patient Details  Name: Leslie [name] MRN: [id number] Date of Birth: Oct 21, 1965 Referring Provider:  Minna Merritts, MD  Below are your personal goals for exercise, nutrition, and risk factors. Our goal is to help you stay on track towards obtaining and maintaining these goals. We will be discussing your progress on these goals with you throughout the program.  Initial Exercise Prescription:  Initial Exercise Prescription - 06/23/20 1600      Date of Initial Exercise RX and Referring Provider   Date 06/23/20    Referring Provider [place]      Treadmill   MPH 1.7    Grade 1    Minutes 15    METs 2.5      Recumbant Bike   Level 2    RPM 60    Watts 24    Minutes 15    METs 2.5      NuStep   Level 2    SPM 80    Minutes 15    METs 2.5      Recumbant Elliptical   Level 1    RPM 50    Watts 24    Minutes 15    METs 2.5      Prescription Details   Frequency (times per week) 3    Duration Progress to 30 minutes of continuous aerobic without signs/symptoms of physical distress      Intensity   Ratings of Perceived Exertion 11-15    Perceived Dyspnea 0-4      Resistance Training   Training Prescription Yes    Weight 3 lb    Reps 10-15           Exercise Goals: Frequency: Be able to perform aerobic exercise two to three times per week in program working toward 2-5 days per week of home exercise.  Intensity: Work with a perceived exertion of 11 (fairly light) - 15 (hard) while following your exercise prescription.  We will make changes to your prescription with you as you progress through the program.   Duration: Be able to do 30 to 45 minutes of continuous aerobic exercise in addition to a 5 minute warm-up and a 5 minute cool-down routine.   Nutrition Goals: Your personal nutrition goals will be established when you do your nutrition analysis with the dietician.  The following are general nutrition guidelines to follow: Cholesterol <  200mg /day Sodium < 1500mg /day Fiber: Women over 50 yrs - 21 grams per day  Personal Goals:  Personal Goals and Risk Factors at Admission - 06/23/20 1620      Core Components/Risk Factors/Patient Goals on Admission    Weight Management Yes;Obesity;Weight Loss    Intervention Weight Management: Develop a combined nutrition and exercise program designed to reach desired caloric intake, while maintaining appropriate intake of nutrient and fiber, sodium and fats, and appropriate energy expenditure required for the weight goal.;Weight Management: Provide education and appropriate resources to help participant work on and attain dietary goals.;Weight Management/Obesity: Establish reasonable short term and long term weight goals.;Obesity: Provide education and appropriate resources to help participant work on and attain dietary goals.    Expected Outcomes Short Term: Continue to assess and modify interventions until short term weight is achieved;Long Term: Adherence to nutrition and physical activity/exercise program aimed toward attainment of established weight goal;Weight Loss: Understanding of general recommendations for a balanced deficit meal plan, which promotes 1-2 lb weight loss per week and includes a negative energy balance of [phone] kcal/d;Understanding recommendations  for meals to include 15-35% energy as protein, 25-35% energy from fat, 35-60% energy from carbohydrates, less than 200mg  of dietary cholesterol, 20-35 gm of total fiber daily;Understanding of distribution of calorie intake throughout the day with the consumption of 4-5 meals/snacks    Heart Failure Yes    Intervention Provide a combined exercise and nutrition program that is supplemented with education, support and counseling about heart failure. Directed toward relieving symptoms such as shortness of breath, decreased exercise tolerance, and extremity edema.    Expected Outcomes Improve functional capacity of life;Short term:  Attendance in program 2-3 days a week with increased exercise capacity. Reported lower sodium intake. Reported increased fruit and vegetable intake. Reports medication compliance.;Short term: Daily weights obtained and reported for increase. Utilizing diuretic protocols set by physician.;Long term: Adoption of self-care skills and reduction of barriers for early signs and symptoms recognition and intervention leading to self-care maintenance.    Hypertension Yes    Intervention Provide education on lifestyle modifcations including regular physical activity/exercise, weight management, moderate sodium restriction and increased consumption of fresh fruit, vegetables, and low fat dairy, alcohol moderation, and smoking cessation.;Monitor prescription use compliance.    Expected Outcomes Short Term: Continued assessment and intervention until BP is < 140/26mm HG in hypertensive participants. < 130/107mm HG in hypertensive participants with diabetes, heart failure or chronic kidney disease.;Long Term: Maintenance of blood pressure at goal levels.    Lipids Yes    Intervention Provide education and support for participant on nutrition & aerobic/resistive exercise along with prescribed medications to achieve LDL 70mg , HDL >40mg .    Expected Outcomes Short Term: Participant states understanding of desired cholesterol values and is compliant with medications prescribed. Participant is following exercise prescription and nutrition guidelines.;Long Term: Cholesterol controlled with medications as prescribed, with individualized exercise RX and with personalized nutrition plan. Value goals: LDL < 70mg , HDL > 40 mg.           Tobacco Use Initial Evaluation: Social History   Tobacco Use  Smoking Status Former Smoker  . Packs/day: 0.50  . Years: 20.00  . Pack years: 10.00  . Types: Cigarettes  . Quit date: 01/2017  . Years since quitting: 3.4  Smokeless Tobacco Never Used    Exercise Goals and Review:   Exercise Goals    Row Name 06/23/20 1609             Exercise Goals   Increase Physical Activity Yes       Intervention Provide advice, education, support and counseling about physical activity/exercise needs.;Develop an individualized exercise prescription for aerobic and resistive training based on initial evaluation findings, risk stratification, comorbidities and participant's personal goals.       Expected Outcomes Short Term: Attend rehab on a regular basis to increase amount of physical activity.;Long Term: Add in home exercise to make exercise part of routine and to increase amount of physical activity.;Long Term: Exercising regularly at least 3-5 days a week.       Increase Strength and Stamina Yes       Intervention Provide advice, education, support and counseling about physical activity/exercise needs.;Develop an individualized exercise prescription for aerobic and resistive training based on initial evaluation findings, risk stratification, comorbidities and participant's personal goals.       Expected Outcomes Short Term: Increase workloads from initial exercise prescription for resistance, speed, and METs.;Short Term: Perform resistance training exercises routinely during rehab and add in resistance training at home;Long Term: Improve cardiorespiratory fitness, muscular endurance and strength as  measured by increased METs and functional capacity (6MWT)       Able to understand and use rate of perceived exertion (RPE) scale Yes       Intervention Provide education and explanation on how to use RPE scale       Expected Outcomes Short Term: Able to use RPE daily in rehab to express subjective intensity level;Long Term:  Able to use RPE to guide intensity level when exercising independently       Able to understand and use Dyspnea scale Yes       Intervention Provide education and explanation on how to use Dyspnea scale       Expected Outcomes Short Term: Able to use Dyspnea scale daily  in rehab to express subjective sense of shortness of breath during exertion;Long Term: Able to use Dyspnea scale to guide intensity level when exercising independently       Knowledge and understanding of Target Heart Rate Range (THRR) Yes       Intervention Provide education and explanation of THRR including how the numbers were predicted and where they are located for reference       Expected Outcomes Short Term: Able to state/look up THRR;Short Term: Able to use daily as guideline for intensity in rehab;Long Term: Able to use THRR to govern intensity when exercising independently       Able to check pulse independently Yes       Intervention Provide education and demonstration on how to check pulse in carotid and radial arteries.;Review the importance of being able to check your own pulse for safety during independent exercise       Expected Outcomes Short Term: Able to explain why pulse checking is important during independent exercise;Long Term: Able to check pulse independently and accurately       Understanding of Exercise Prescription Yes       Intervention Provide education, explanation, and written materials on patient's individual exercise prescription       Expected Outcomes Short Term: Able to explain program exercise prescription;Long Term: Able to explain home exercise prescription to exercise independently              Copy of goals given to participant.

## 2020-06-23 NOTE — Progress Notes (Signed)
Cardiac Individual Treatment Plan  Patient Details  Name: DILIA ALEMANY MRN: [id number] Date of Birth: 1965-03-22 Referring Provider:   Flowsheet Row Cardiac Rehab from 06/23/2020 in [facility] Cardiac and Pulmonary Rehab  Referring Provider Gollan      Initial Encounter Date:  Flowsheet Row Cardiac Rehab from 06/23/2020 in [facility] Cardiac and Pulmonary Rehab  Date 06/23/20      Visit Diagnosis: S/P MVR (mitral valve replacement)  Patient's Home Medications on Admission:  Current Outpatient Medications:  .  acetaminophen (TYLENOL) 500 MG tablet, Take 1,000 mg by mouth every 6 (six) hours as needed for moderate pain., Disp: , Rfl:  .  albuterol (VENTOLIN HFA) 108 (90 Base) MCG/ACT inhaler, Inhale 2 puffs into the lungs every 6 (six) hours as needed for wheezing or shortness of breath., Disp: 8 g, Rfl: 2 .  amLODipine (NORVASC) 10 MG tablet, Take 1 tablet (10 mg total) by mouth daily., Disp: 90 tablet, Rfl: 1 .  amoxicillin (AMOXIL) 500 MG tablet, Take 2,000 mg by mouth See admin instructions. Take 4 capsules (2000 mg) by mouth 1 hour prior to dental work (Patient not taking: Reported on 06/19/2020), Disp: , Rfl:  .  aspirin EC 81 MG tablet, Take 1 tablet (81 mg total) by mouth daily. Swallow whole., Disp: 100 tablet, Rfl: 2 .  atorvastatin (LIPITOR) 80 MG tablet, TAKE 1 TABLET BY MOUTH EVERY DAY AT 6 PM, Disp: 90 tablet, Rfl: 1 .  calcium carbonate (TUMS - DOSED IN MG ELEMENTAL CALCIUM) 500 MG chewable tablet, Chew 500 mg by mouth daily as needed for indigestion or heartburn., Disp: , Rfl:  .  ferrous [id number] C-folic acid (TRINSICON / FOLTRIN) capsule, Take 1 capsule by mouth 2 (two) times daily after a meal., Disp: 60 capsule, Rfl: 2 .  fluticasone (FLONASE) 50 MCG/ACT nasal spray, Place 1 spray into both nostrils daily as needed for rhinitis., Disp: , Rfl:  .  metoprolol tartrate (LOPRESSOR) 25 MG tablet, Take 2 tablets (50 mg total) by mouth 2 (two) times daily., Disp: 180 tablet, Rfl:  1 .  potassium chloride SA (KLOR-CON) 20 MEQ tablet, Take 1 tablet (20 mEq total) by mouth 2 (two) times daily., Disp: 60 tablet, Rfl: 5 .  spironolactone (ALDACTONE) 25 MG tablet, Take 0.5 tablets (12.5 mg total) by mouth daily., Disp: 90 tablet, Rfl: 1 .  torsemide (DEMADEX) 20 MG tablet, Take 2 tablets (40 mg total) by mouth 2 (two) times daily., Disp: 180 tablet, Rfl: 1 .  warfarin (COUMADIN) 2 MG tablet, Take 1 tablet (2 mg total) by mouth daily at 4 PM. As instructed by Coumadin clinic for lifetime, Disp: 30 tablet, Rfl: 1  Past Medical History: Past Medical History:  Diagnosis Date  . (HFpEF) heart failure with preserved ejection fraction ([place])    a. 08/2017 Echo: EF 55-60%.  Grade 2 diastolic dysfunction.  Moderate mitral stenosis.  . Allergy   . Anemia   . Anxiety   . BRCA negative 03/22/2013  . Bronchitis 02/19/2016   ON LEVAQUIN PO  . Chest tightness   . CHF (congestive heart failure) ([place])   . Cigarette smoker 09/11/2017   8-10 day  . Dyspnea   . Dysrhythmia   . GERD (gastroesophageal reflux disease)   . Heart murmur    Per pt. dx by Dr. Roxy Manns via [place]  . History of kidney stones   . Hyperlipidemia   . Hypertension   . Interstitial lung disease ([place])    a. CT 2013 b. 02/2018  CXR noted recurrent intersistial changes ILD vs chronic bronchitis  . Moderate mitral stenosis    a.  08/2017 TEE: EF 60 to 65%.  Moderate mitral stenosis.  Mean gradient 14 mmHg.  Valve area 2.59 cm by planimetry, 2.72 cm by pressure half-time.  . Persistent atrial fibrillation ([place])    a. 08/2017 s/p TEE/DCCV; b. CHA2DS2VASc = 2-->warfarin.  . Pneumonia   . S/P Maze operation for atrial fibrillation 04/29/2020   Complete bilateral atrial lesion set using bipolar radiofrequency and cryothermy ablation with clipping of LA appendage  . S/P mitral valve replacement with Onyx bileaflet mechanical valve 04/29/2020   27/29 mm Onyx-Valve    Tobacco Use: Social History   Tobacco Use  Smoking  Status Former Smoker  . Packs/day: 0.50  . Years: 20.00  . Pack years: 10.00  . Types: Cigarettes  . Quit date: 01/2017  . Years since quitting: 3.4  Smokeless Tobacco Never Used    Labs: Recent Review Scientist, physiological    Labs for ITP Cardiac and Pulmonary Rehab Latest Ref Rng & Units 05/07/2020 05/07/2020 05/09/2020 05/10/2020 06/06/2020   Cholestrol 100 - 199 mg/dL - - - - -   LDLCALC 0 - 99 mg/dL - - - - -   HDL >39 mg/dL - - - - -   Trlycerides 0 - 149 mg/dL - - - - -   Hemoglobin A1c 4.0 - 5.6 % - - - - 5.8(A)   PHART 7.350 - 7.450 - - - - -   PCO2ART 32.0 - 48.0 mmHg - - - - -   HCO3 20.0 - 28.0 mmol/L - - - - -   TCO2 22 - 32 mmol/L - - - - -   ACIDBASEDEF 0.0 - 2.0 mmol/L - - - - -   O2SAT % 54.5 60.8 52.4 63.8 -       Exercise Target Goals: Exercise Program Goal: Individual exercise prescription set using results from initial 6 min walk test and THRR while considering  patient's activity barriers and safety.   Exercise Prescription Goal: Initial exercise prescription builds to 30-45 minutes a day of aerobic activity, 2-3 days per week.  Home exercise guidelines will be given to patient during program as part of exercise prescription that the participant will acknowledge.   Education: Aerobic Exercise: - Group verbal and visual presentation on the components of exercise prescription. Introduces F.I.T.T principle from ACSM for exercise prescriptions.  Reviews F.I.T.T. principles of aerobic exercise including progression. Written material given at graduation.   Education: Resistance Exercise: - Group verbal and visual presentation on the components of exercise prescription. Introduces F.I.T.T principle from ACSM for exercise prescriptions  Reviews F.I.T.T. principles of resistance exercise including progression. Written material given at graduation.    Education: Exercise & Equipment Safety: - Individual verbal instruction and demonstration of equipment use and safety with  use of the equipment. Flowsheet Row Cardiac Rehab from 06/23/2020 in [facility] Cardiac and Pulmonary Rehab  Date 06/23/20  Educator AS  Instruction Review Code 1- Verbalizes Understanding      Education: Exercise Physiology & General Exercise Guidelines: - Group verbal and written instruction with models to review the exercise physiology of the cardiovascular system and associated critical values. Provides general exercise guidelines with specific guidelines to those with heart or lung disease.    Education: Flexibility, Balance, Mind/Body Relaxation: - Group verbal and visual presentation with interactive activity on the components of exercise prescription. Introduces F.I.T.T principle from ACSM for exercise prescriptions. Reviews F.I.T.T.  principles of flexibility and balance exercise training including progression. Also discusses the mind body connection.  Reviews various relaxation techniques to help reduce and manage stress (i.e. Deep breathing, progressive muscle relaxation, and visualization). Balance handout provided to take home. Written material given at graduation.   Activity Barriers & Risk Stratification:  Activity Barriers & Cardiac Risk Stratification - 06/17/20 0839      Activity Barriers & Cardiac Risk Stratification   Activity Barriers Left Knee Replacement;Right Knee Replacement;Deconditioning;Muscular Weakness;Shortness of Breath;Decreased Ventricular Function    Cardiac Risk Stratification Moderate           6 Minute Walk:  6 Minute Walk    Row Name 06/23/20 1600         6 Minute Walk   Phase Initial     Distance 905 feet     Walk Time 6 minutes     # of Rest Breaks 0     MPH 1.7     METS 2.73     RPE 9     Perceived Dyspnea  1     VO2 Peak 9.5     Symptoms No     Resting HR 86 bpm     Resting BP 106/58     Resting Oxygen Saturation  99 %     Exercise Oxygen Saturation  during 6 min walk 96 %     Max Ex. HR 113 bpm     Max Ex. BP 144/60     2 Minute  Post BP 108/58            Oxygen Initial Assessment:   Oxygen Re-Evaluation:   Oxygen Discharge (Final Oxygen Re-Evaluation):   Initial Exercise Prescription:  Initial Exercise Prescription - 06/23/20 1600      Date of Initial Exercise RX and Referring Provider   Date 06/23/20    Referring Provider Gollan      Treadmill   MPH 1.7    Grade 1    Minutes 15    METs 2.5      Recumbant Bike   Level 2    RPM 60    Watts 24    Minutes 15    METs 2.5      NuStep   Level 2    SPM 80    Minutes 15    METs 2.5      Recumbant Elliptical   Level 1    RPM 50    Watts 24    Minutes 15    METs 2.5      Prescription Details   Frequency (times per week) 3    Duration Progress to 30 minutes of continuous aerobic without signs/symptoms of physical distress      Intensity   Ratings of Perceived Exertion 11-15    Perceived Dyspnea 0-4      Resistance Training   Training Prescription Yes    Weight 3 lb    Reps 10-15           Perform Capillary Blood Glucose checks as needed.  Exercise Prescription Changes:  Exercise Prescription Changes    Row Name 06/23/20 1600             Response to Exercise   Blood Pressure (Admit) 106/58       Blood Pressure (Exercise) 144/60       Blood Pressure (Exit) 108/58       Heart Rate (Admit) 86 bpm       Heart Rate (Exercise) 113  bpm       Heart Rate (Exit) 87 bpm       Oxygen Saturation (Admit) 99 %       Oxygen Saturation (Exercise) 96 %       Rating of Perceived Exertion (Exercise) 9       Perceived Dyspnea (Exercise) 1       Symptoms none              Exercise Comments:   Exercise Goals and Review:  Exercise Goals    Row Name 06/23/20 1609             Exercise Goals   Increase Physical Activity Yes       Intervention Provide advice, education, support and counseling about physical activity/exercise needs.;Develop an individualized exercise prescription for aerobic and resistive training based on  initial evaluation findings, risk stratification, comorbidities and participant's personal goals.       Expected Outcomes Short Term: Attend rehab on a regular basis to increase amount of physical activity.;Long Term: Add in home exercise to make exercise part of routine and to increase amount of physical activity.;Long Term: Exercising regularly at least 3-5 days a week.       Increase Strength and Stamina Yes       Intervention Provide advice, education, support and counseling about physical activity/exercise needs.;Develop an individualized exercise prescription for aerobic and resistive training based on initial evaluation findings, risk stratification, comorbidities and participant's personal goals.       Expected Outcomes Short Term: Increase workloads from initial exercise prescription for resistance, speed, and METs.;Short Term: Perform resistance training exercises routinely during rehab and add in resistance training at home;Long Term: Improve cardiorespiratory fitness, muscular endurance and strength as measured by increased METs and functional capacity (6MWT)       Able to understand and use rate of perceived exertion (RPE) scale Yes       Intervention Provide education and explanation on how to use RPE scale       Expected Outcomes Short Term: Able to use RPE daily in rehab to express subjective intensity level;Long Term:  Able to use RPE to guide intensity level when exercising independently       Able to understand and use Dyspnea scale Yes       Intervention Provide education and explanation on how to use Dyspnea scale       Expected Outcomes Short Term: Able to use Dyspnea scale daily in rehab to express subjective sense of shortness of breath during exertion;Long Term: Able to use Dyspnea scale to guide intensity level when exercising independently       Knowledge and understanding of Target Heart Rate Range (THRR) Yes       Intervention Provide education and explanation of THRR  including how the numbers were predicted and where they are located for reference       Expected Outcomes Short Term: Able to state/look up THRR;Short Term: Able to use daily as guideline for intensity in rehab;Long Term: Able to use THRR to govern intensity when exercising independently       Able to check pulse independently Yes       Intervention Provide education and demonstration on how to check pulse in carotid and radial arteries.;Review the importance of being able to check your own pulse for safety during independent exercise       Expected Outcomes Short Term: Able to explain why pulse checking is important during independent exercise;Long Term: Able to check  pulse independently and accurately       Understanding of Exercise Prescription Yes       Intervention Provide education, explanation, and written materials on patient's individual exercise prescription       Expected Outcomes Short Term: Able to explain program exercise prescription;Long Term: Able to explain home exercise prescription to exercise independently              Exercise Goals Re-Evaluation :   Discharge Exercise Prescription (Final Exercise Prescription Changes):  Exercise Prescription Changes - 06/23/20 1600      Response to Exercise   Blood Pressure (Admit) 106/58    Blood Pressure (Exercise) 144/60    Blood Pressure (Exit) 108/58    Heart Rate (Admit) 86 bpm    Heart Rate (Exercise) 113 bpm    Heart Rate (Exit) 87 bpm    Oxygen Saturation (Admit) 99 %    Oxygen Saturation (Exercise) 96 %    Rating of Perceived Exertion (Exercise) 9    Perceived Dyspnea (Exercise) 1    Symptoms none           Nutrition:  Target Goals: Understanding of nutrition guidelines, daily intake of sodium <1541m, cholesterol <2056m calories 30% from fat and 7% or less from saturated fats, daily to have 5 or more servings of fruits and vegetables.  Education: All About Nutrition: -Group instruction provided by verbal,  written material, interactive activities, discussions, models, and posters to present general guidelines for heart healthy nutrition including fat, fiber, MyPlate, the role of sodium in heart healthy nutrition, utilization of the nutrition label, and utilization of this knowledge for meal planning. Follow up email sent as well. Written material given at graduation.   Biometrics:  Pre Biometrics - 06/23/20 1610      Pre Biometrics   Height '5\' 5"'  (1.651 m)    Weight 232 lb 1.6 oz (105.3 kg)    BMI (Calculated) 38.62    Single Leg Stand 5.28 seconds            Nutrition Therapy Plan and Nutrition Goals:  Nutrition Therapy & Goals - 06/17/20 0850      Intervention Plan   Intervention Prescribe, educate and counsel regarding individualized specific dietary modifications aiming towards targeted core components such as weight, hypertension, lipid management, diabetes, heart failure and other comorbidities.    Expected Outcomes Short Term Goal: Understand basic principles of dietary content, such as calories, fat, sodium, cholesterol and nutrients.;Short Term Goal: A plan has been developed with personal nutrition goals set during dietitian appointment.;Long Term Goal: Adherence to prescribed nutrition plan.           Nutrition Assessments:  MEDIFICTS Score Key:  ?70 Need to make dietary changes   40-70 Heart Healthy Diet  ? 40 Therapeutic Level Cholesterol Diet  [facility]  Picture Your Plate Total Score on Admission 68     Picture Your Plate Scores:  <4<17nhealthy dietary pattern with much room for improvement.  41-50 Dietary pattern unlikely to meet recommendations for good health and room for improvement.  51-60 More healthful dietary pattern, with some room for improvement.   >60 Healthy dietary pattern, although there may be some specific behaviors that could be improved.    Nutrition Goals  Re-Evaluation:   Nutrition Goals Discharge (Final Nutrition Goals Re-Evaluation):   Psychosocial: Target Goals: Acknowledge presence or absence of significant depression and/or stress, maximize coping skills, provide positive support system. Participant  is able to verbalize types and ability to use techniques and skills needed for reducing stress and depression.   Education: Stress, Anxiety, and Depression - Group verbal and visual presentation to define topics covered.  Reviews how body is impacted by stress, anxiety, and depression.  Also discusses healthy ways to reduce stress and to treat/manage anxiety and depression.  Written material given at graduation.   Education: Sleep Hygiene -Provides group verbal and written instruction about how sleep can affect your health.  Define sleep hygiene, discuss sleep cycles and impact of sleep habits. Review good sleep hygiene tips.    Initial Review & Psychosocial Screening:  Initial Psych Review & Screening - 06/17/20 0840      Initial Review   Current issues with Current Stress Concerns    Source of Stress Concerns Chronic Illness;Financial;Occupation    Comments Still social distancing and wearing mask, still paranoid, history of anxiety, still recovering from surgery, still worring about finanances on short term disabilty and currently thinking about applying for disability overall      Family Dynamics   Good Support System? Yes   husband, friends, father (67), 2 daughters, 1 son, 7 grandchilden, 4 brothers   Comments Big support system and she can call any anytime      Barriers   Psychosocial barriers to participate in program The patient should benefit from training in stress management and relaxation.;Psychosocial barriers identified (see note)      Screening Interventions   Interventions Encouraged to exercise;To provide support and resources with identified psychosocial needs;Provide feedback about the scores to participant     Expected Outcomes Short Term goal: Utilizing psychosocial counselor, staff and physician to assist with identification of specific Stressors or current issues interfering with healing process. Setting desired goal for each stressor or current issue identified.;Long Term Goal: Stressors or current issues are controlled or eliminated.;Short Term goal: Identification and review with participant of any Quality of Life or Depression concerns found by scoring the questionnaire.;Long Term goal: The participant improves quality of Life and PHQ9 Scores as seen by post scores and/or verbalization of changes           Quality of Life Scores:   Quality of Life - 06/23/20 1615      Quality of Life   Select Quality of Life      Quality of Life Scores   Health/Function Pre 23.57 %    Socioeconomic Pre 23.26 %    Psych/Spiritual Pre 28.29 %    Family Pre 26.7 %    GLOBAL Pre 24.96 %          Scores of 19 and below usually indicate a poorer quality of life in these areas.  A difference of  2-3 points is a clinically meaningful difference.  A difference of 2-3 points in the total score of the Quality of Life Index has been associated with significant improvement in overall quality of life, self-image, physical symptoms, and general health in studies assessing change in quality of life.  PHQ-9: Recent Review Flowsheet Data    Depression screen [facility] 2/9 06/23/2020 03/20/2020 10/24/2019 09/20/2019 02/20/2019   Decreased Interest 0 0 0 0 0   Down, Depressed, Hopeless 0 0 0 0 0   PHQ - 2 Score 0 0 0 0 0   Altered sleeping 2 - - - -   Tired, decreased energy 1 - - - -   Change in appetite 0 - - - -   Feeling bad or failure  about yourself  0 - - - -   Trouble concentrating 0 - - - -   Moving slowly or fidgety/restless 0 - - - -   PHQ-9 Score 3 - - - -   Difficult doing work/chores Somewhat difficult - - - -     Interpretation of Total Score  Total Score Depression Severity:  1-4 = Minimal depression, 5-9 =  Mild depression, 10-14 = Moderate depression, 15-19 = Moderately severe depression, 20-27 = Severe depression   Psychosocial Evaluation and Intervention:  Psychosocial Evaluation - 06/17/20 0842      Psychosocial Evaluation & Interventions   Interventions Encouraged to exercise with the program and follow exercise prescription;Stress management education;Relaxation education    Comments Ashritha is coming into rehab after a valve replacement.  Her valve has been limiting her from doing everything she wants to do.  When traveling and doing things with her family, she has had to sit and be left behind to catch her breath and feel better.  She has a trip planning to [facility] in October for her and her husbands anniversary.  Her biggest stressor is her hob.  Her job is very stressful and she is trying to decide about disability.  She is interested in maybe vocational rehab to help switch up her job.  One of her current stressors also is finances surrounding all of this and being out on short term disability and still being able to make payments.  She has a great support system in her family that she can call upon at any point.  Prior to this, she had never heard of cardiac rehab and is looking forward to learning and absorbing as much as she can.  She wants to get back to her normal self to be able to travel and go again.  She would also like to continue to lose weight.    Expected Outcomes Short: Attend rehab to learn about self and boost health Long: Continue to improve stamina    Continue Psychosocial Services  Follow up required by staff           Psychosocial Re-Evaluation:   Psychosocial Discharge (Final Psychosocial Re-Evaluation):   Vocational Rehabilitation: Provide vocational rehab assistance to qualifying candidates.   Vocational Rehab Evaluation & Intervention:  Vocational Rehab - 06/17/20 0846      Initial Vocational Rehab Evaluation & Intervention   Assessment shows need for  Vocational Rehabilitation Yes   pt trying to decided about disbabilty or new job          Education: Education Goals: Education classes will be provided on a variety of topics geared toward better understanding of heart health and risk factor modification. Participant will state understanding/return demonstration of topics presented as noted by education test scores.  Learning Barriers/Preferences:  Learning Barriers/Preferences - 06/17/20 1856      Learning Barriers/Preferences   Learning Barriers Sight   wears bifocal glasses   Learning Preferences None           General Cardiac Education Topics:  AED/CPR: - Group verbal and written instruction with the use of models to demonstrate the basic use of the AED with the basic ABC's of resuscitation.   Anatomy and Cardiac Procedures: - Group verbal and visual presentation and models provide information about basic cardiac anatomy and function. Reviews the testing methods done to diagnose heart disease and the outcomes of the test results. Describes the treatment choices: Medical Management, Angioplasty, or Coronary Bypass Surgery for treating various  heart conditions including Myocardial Infarction, Angina, Valve Disease, and Cardiac Arrhythmias.  Written material given at graduation.   Medication Safety: - Group verbal and visual instruction to review commonly prescribed medications for heart and lung disease. Reviews the medication, class of the drug, and side effects. Includes the steps to properly store meds and maintain the prescription regimen.  Written material given at graduation.   Intimacy: - Group verbal instruction through game format to discuss how heart and lung disease can affect sexual intimacy. Written material given at graduation..   Know Your Numbers and Heart Failure: - Group verbal and visual instruction to discuss disease risk factors for cardiac and pulmonary disease and treatment options.  Reviews associated  critical values for Overweight/Obesity, Hypertension, Cholesterol, and Diabetes.  Discusses basics of heart failure: signs/symptoms and treatments.  Introduces Heart Failure Zone chart for action plan for heart failure.  Written material given at graduation.   Infection Prevention: - Provides verbal and written material to individual with discussion of infection control including proper hand washing and proper equipment cleaning during exercise session. Flowsheet Row Cardiac Rehab from 06/23/2020 in [facility] Cardiac and Pulmonary Rehab  Date 06/23/20  Educator AS  Instruction Review Code 1- Verbalizes Understanding      Falls Prevention: - Provides verbal and written material to individual with discussion of falls prevention and safety. Flowsheet Row Cardiac Rehab from 06/23/2020 in [facility] Cardiac and Pulmonary Rehab  Date 06/23/20  Educator AS  Instruction Review Code 1- Verbalizes Understanding      Other: -Provides group and verbal instruction on various topics (see comments)   Knowledge Questionnaire Score:  Knowledge Questionnaire Score - 06/23/20 1619      Knowledge Questionnaire Score   Pre Score 22/26 nutrition angina           Core Components/Risk Factors/Patient Goals at Admission:  Personal Goals and Risk Factors at Admission - 06/23/20 1620      Core Components/Risk Factors/Patient Goals on Admission    Weight Management Yes;Obesity;Weight Loss    Intervention Weight Management: Develop a combined nutrition and exercise program designed to reach desired caloric intake, while maintaining appropriate intake of nutrient and fiber, sodium and fats, and appropriate energy expenditure required for the weight goal.;Weight Management: Provide education and appropriate resources to help participant work on and attain dietary goals.;Weight Management/Obesity: Establish reasonable short term and long term weight goals.;Obesity: Provide education and appropriate resources to help  participant work on and attain dietary goals.    Expected Outcomes Short Term: Continue to assess and modify interventions until short term weight is achieved;Long Term: Adherence to nutrition and physical activity/exercise program aimed toward attainment of established weight goal;Weight Loss: Understanding of general recommendations for a balanced deficit meal plan, which promotes 1-2 lb weight loss per week and includes a negative energy balance of [phone] kcal/d;Understanding recommendations for meals to include 15-35% energy as protein, 25-35% energy from fat, 35-60% energy from carbohydrates, less than 225m of dietary cholesterol, 20-35 gm of total fiber daily;Understanding of distribution of calorie intake throughout the day with the consumption of 4-5 meals/snacks    Heart Failure Yes    Intervention Provide a combined exercise and nutrition program that is supplemented with education, support and counseling about heart failure. Directed toward relieving symptoms such as shortness of breath, decreased exercise tolerance, and extremity edema.    Expected Outcomes Improve functional capacity of life;Short term: Attendance in program 2-3 days a week with increased exercise capacity. Reported lower sodium intake. Reported increased  fruit and vegetable intake. Reports medication compliance.;Short term: Daily weights obtained and reported for increase. Utilizing diuretic protocols set by physician.;Long term: Adoption of self-care skills and reduction of barriers for early signs and symptoms recognition and intervention leading to self-care maintenance.    Hypertension Yes    Intervention Provide education on lifestyle modifcations including regular physical activity/exercise, weight management, moderate sodium restriction and increased consumption of fresh fruit, vegetables, and low fat dairy, alcohol moderation, and smoking cessation.;Monitor prescription use compliance.    Expected Outcomes Short Term:  Continued assessment and intervention until BP is < 140/79m HG in hypertensive participants. < 130/860mHG in hypertensive participants with diabetes, heart failure or chronic kidney disease.;Long Term: Maintenance of blood pressure at goal levels.    Lipids Yes    Intervention Provide education and support for participant on nutrition & aerobic/resistive exercise along with prescribed medications to achieve LDL <701mHDL >20m31m  Expected Outcomes Short Term: Participant states understanding of desired cholesterol values and is compliant with medications prescribed. Participant is following exercise prescription and nutrition guidelines.;Long Term: Cholesterol controlled with medications as prescribed, with individualized exercise RX and with personalized nutrition plan. Value goals: LDL < 70mg14mL > 40 mg.           Education:Diabetes - Individual verbal and written instruction to review signs/symptoms of diabetes, desired ranges of glucose level fasting, after meals and with exercise. Acknowledge that pre and post exercise glucose checks will be done for 3 sessions at entry of program.   Core Components/Risk Factors/Patient Goals Review:    Core Components/Risk Factors/Patient Goals at Discharge (Final Review):    ITP Comments:  ITP Comments    Row Name 06/17/20 0900           ITP Comments Completed virtual orientation today.  EP evaluation is scheduled for Monday 6/6 at 230pm.  [facility]              Comments: initial ITP

## 2020-06-24 ENCOUNTER — Other Ambulatory Visit: Payer: Self-pay

## 2020-06-24 ENCOUNTER — Other Ambulatory Visit: Payer: Self-pay | Admitting: Internal Medicine

## 2020-06-24 ENCOUNTER — Telehealth: Payer: Self-pay

## 2020-06-24 MED ORDER — OMEPRAZOLE 40 MG PO CPDR: 40.0000 mg | DELAYED_RELEASE_CAPSULE | Freq: Every day | ORAL | 0 refills | Status: DC

## 2020-06-24 NOTE — Telephone Encounter (Signed)
Pt called that she still taking omeprazole as per dr Humphrey Rolls send to [facility] and advised her to bring all her medicine bottles at next visit

## 2020-06-25 ENCOUNTER — Other Ambulatory Visit: Payer: Self-pay

## 2020-06-25 ENCOUNTER — Ambulatory Visit (INDEPENDENT_AMBULATORY_CARE_PROVIDER_SITE_OTHER): Payer: 59

## 2020-06-25 DIAGNOSIS — Z5181 Encounter for therapeutic drug level monitoring: Secondary | ICD-10-CM | POA: Diagnosis not present

## 2020-06-25 DIAGNOSIS — I4891 Unspecified atrial fibrillation: Secondary | ICD-10-CM | POA: Diagnosis not present

## 2020-06-25 DIAGNOSIS — I050 Rheumatic mitral stenosis: Secondary | ICD-10-CM | POA: Diagnosis not present

## 2020-06-25 LAB — POCT INR: INR: 6.5 — AB (ref 2.0–3.0)

## 2020-06-25 NOTE — Patient Instructions (Signed)
-   skip warfarin tonight and tomorrow, then - START NEW DOSAGE of warfarin of 2 mg every day EXCEPT 1/2 TABLET ON MONDAYS AND FRIDAYS - Recheck as scheduled in 1 week

## 2020-06-30 ENCOUNTER — Ambulatory Visit: Payer: 59

## 2020-07-02 ENCOUNTER — Encounter: Payer: Self-pay | Admitting: *Deleted

## 2020-07-02 ENCOUNTER — Other Ambulatory Visit: Payer: Self-pay

## 2020-07-02 ENCOUNTER — Ambulatory Visit (INDEPENDENT_AMBULATORY_CARE_PROVIDER_SITE_OTHER): Payer: 59

## 2020-07-02 ENCOUNTER — Ambulatory Visit: Payer: 59

## 2020-07-02 DIAGNOSIS — I050 Rheumatic mitral stenosis: Secondary | ICD-10-CM

## 2020-07-02 DIAGNOSIS — I4891 Unspecified atrial fibrillation: Secondary | ICD-10-CM | POA: Diagnosis not present

## 2020-07-02 DIAGNOSIS — Z5181 Encounter for therapeutic drug level monitoring: Secondary | ICD-10-CM

## 2020-07-02 DIAGNOSIS — Z952 Presence of prosthetic heart valve: Secondary | ICD-10-CM

## 2020-07-02 LAB — POCT INR: INR: 3.8 — AB (ref 2.0–3.0)

## 2020-07-02 NOTE — Patient Instructions (Signed)
-   have a serving of greens today,  - continue dosage of warfarin 1/2 tablet every day - Recheck as scheduled in 1 week

## 2020-07-02 NOTE — Progress Notes (Signed)
Cardiac Individual Treatment Plan  Patient Details  Name: Leslie [name] MRN: [id number] Date of Birth: 01/17/1966 Referring Provider:   Flowsheet Row Cardiac Rehab from 06/23/2020 in [facility] Cardiac and Pulmonary Rehab  Referring Provider Gollan       Initial Encounter Date:  Flowsheet Row Cardiac Rehab from 06/23/2020 in [facility] Cardiac and Pulmonary Rehab  Date 06/23/20       Visit Diagnosis: S/P MVR (mitral valve replacement)  Patient's Home Medications on Admission:  Current Outpatient Medications:    acetaminophen (TYLENOL) 500 MG tablet, Take 1,000 mg by mouth every 6 (six) hours as needed for moderate pain., Disp: , Rfl:    albuterol (VENTOLIN HFA) 108 (90 Base) MCG/ACT inhaler, Inhale 2 puffs into the lungs every 6 (six) hours as needed for wheezing or shortness of breath., Disp: 8 g, Rfl: 2   amLODipine (NORVASC) 10 MG tablet, Take 1 tablet (10 mg total) by mouth daily., Disp: 90 tablet, Rfl: 1   amoxicillin (AMOXIL) 500 MG tablet, Take 2,000 mg by mouth See admin instructions. Take 4 capsules (2000 mg) by mouth 1 hour prior to dental work (Patient not taking: Reported on 06/19/2020), Disp: , Rfl:    aspirin EC 81 MG tablet, Take 1 tablet (81 mg total) by mouth daily. Swallow whole., Disp: 100 tablet, Rfl: 2   atorvastatin (LIPITOR) 80 MG tablet, TAKE 1 TABLET BY MOUTH EVERY DAY AT 6 PM, Disp: 90 tablet, Rfl: 1   calcium carbonate (TUMS - DOSED IN MG ELEMENTAL CALCIUM) 500 MG chewable tablet, Chew 500 mg by mouth daily as needed for indigestion or heartburn., Disp: , Rfl:    ferrous [id number] C-folic acid (TRINSICON / FOLTRIN) capsule, Take 1 capsule by mouth 2 (two) times daily after a meal., Disp: 60 capsule, Rfl: 2   fluticasone (FLONASE) 50 MCG/ACT nasal spray, Place 1 spray into both nostrils daily as needed for rhinitis., Disp: , Rfl:    metoprolol tartrate (LOPRESSOR) 25 MG tablet, Take 2 tablets (50 mg total) by mouth 2 (two) times daily., Disp: 180 tablet, Rfl: 1    omeprazole (PRILOSEC) 40 MG capsule, TAKE 1 CAPSULE(40 MG) BY MOUTH DAILY, Disp: 90 capsule, Rfl: 0   potassium chloride SA (KLOR-CON) 20 MEQ tablet, Take 1 tablet (20 mEq total) by mouth 2 (two) times daily., Disp: 60 tablet, Rfl: 5   spironolactone (ALDACTONE) 25 MG tablet, Take 0.5 tablets (12.5 mg total) by mouth daily., Disp: 90 tablet, Rfl: 1   torsemide (DEMADEX) 20 MG tablet, Take 2 tablets (40 mg total) by mouth 2 (two) times daily., Disp: 180 tablet, Rfl: 1   warfarin (COUMADIN) 2 MG tablet, Take 1 tablet (2 mg total) by mouth daily at 4 PM. As instructed by Coumadin clinic for lifetime, Disp: 30 tablet, Rfl: 1  Past Medical History: Past Medical History:  Diagnosis Date   (HFpEF) heart failure with preserved ejection fraction ([place])    a. 08/2017 Echo: EF 55-60%.  Grade 2 diastolic dysfunction.  Moderate mitral stenosis.   Allergy    Anemia    Anxiety    BRCA negative 03/22/2013   Bronchitis 02/19/2016   ON LEVAQUIN PO   Chest tightness    CHF (congestive heart failure) (HCC)    Cigarette smoker 09/11/2017   8-10 day   Dyspnea    Dysrhythmia    GERD (gastroesophageal reflux disease)    Heart murmur    Per pt. dx by Dr. Roxy Manns via stethescope   History of kidney stones  Hyperlipidemia    Hypertension    Interstitial lung disease ([place])    a. CT 2013 b. 02/2018 CXR noted recurrent intersistial changes ILD vs chronic bronchitis   Moderate mitral stenosis    a.  08/2017 TEE: EF 60 to 65%.  Moderate mitral stenosis.  Mean gradient 14 mmHg.  Valve area 2.59 cm by planimetry, 2.72 cm by pressure half-time.   Persistent atrial fibrillation ([place])    a. 08/2017 s/p TEE/DCCV; b. CHA2DS2VASc = 2-->warfarin.   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 04/29/2020   27/29 mm  Onyx-Valve    Tobacco Use: Social History   Tobacco Use  Smoking Status Former   Packs/day: 0.50   Years: 20.00   Pack years: 10.00   Types: Cigarettes   Quit date: 01/2017   Years since quitting: 3.4  Smokeless Tobacco Never    Labs: Recent Review Flowsheet Data     Labs for ITP Cardiac and Pulmonary Rehab Latest Ref Rng & Units 05/07/2020 05/07/2020 05/09/2020 05/10/2020 06/06/2020   Cholestrol 100 - 199 mg/dL - - - - -   LDLCALC 0 - 99 mg/dL - - - - -   HDL >39 mg/dL - - - - -   Trlycerides 0 - 149 mg/dL - - - - -   Hemoglobin A1c 4.0 - 5.6 % - - - - 5.8(A)   PHART 7.350 - 7.450 - - - - -   PCO2ART 32.0 - 48.0 mmHg - - - - -   HCO3 20.0 - 28.0 mmol/L - - - - -   TCO2 22 - 32 mmol/L - - - - -   ACIDBASEDEF 0.0 - 2.0 mmol/L - - - - -   O2SAT % 54.5 60.8 52.4 63.8 -        Exercise Target Goals: Exercise Program Goal: Individual exercise prescription set using results from initial 6 min walk test and THRR while considering  patient's activity barriers and safety.   Exercise Prescription Goal: Initial exercise prescription builds to 30-45 minutes a day of aerobic activity, 2-3 days per week.  Home exercise guidelines will be given to patient during program as part of exercise prescription that the participant will acknowledge.   Education: Aerobic Exercise: - Group verbal and visual presentation on the components of exercise prescription. Introduces F.I.T.T principle from ACSM for exercise prescriptions.  Reviews F.I.T.T. principles of aerobic exercise including progression. Written material given at graduation.   Education: Resistance Exercise: - Group verbal and visual presentation on the components of exercise prescription. Introduces F.I.T.T principle from ACSM for exercise prescriptions  Reviews F.I.T.T. principles of resistance exercise including progression. Written material given at graduation.    Education: Exercise & Equipment Safety: - Individual verbal  instruction and demonstration of equipment use and safety with use of the equipment. Flowsheet Row Cardiac Rehab from 06/23/2020 in [facility] Cardiac and Pulmonary Rehab  Date 06/23/20  Educator AS  Instruction Review Code 1- Verbalizes Understanding       Education: Exercise Physiology & General Exercise Guidelines: - Group verbal and written instruction with models to review the exercise physiology of the cardiovascular system and associated critical values. Provides general exercise guidelines with specific guidelines to those with heart or lung disease.    Education: Flexibility, Balance, Mind/Body Relaxation: - Group verbal and  visual presentation with interactive activity on the components of exercise prescription. Introduces F.I.T.T principle from ACSM for exercise prescriptions. Reviews F.I.T.T. principles of flexibility and balance exercise training including progression. Also discusses the mind body connection.  Reviews various relaxation techniques to help reduce and manage stress (i.e. Deep breathing, progressive muscle relaxation, and visualization). Balance handout provided to take home. Written material given at graduation.   Activity Barriers & Risk Stratification:  Activity Barriers & Cardiac Risk Stratification - 06/17/20 0839       Activity Barriers & Cardiac Risk Stratification   Activity Barriers Left Knee Replacement;Right Knee Replacement;Deconditioning;Muscular Weakness;Shortness of Breath;Decreased Ventricular Function    Cardiac Risk Stratification Moderate             6 Minute Walk:  6 Minute Walk     Row Name 06/23/20 1600         6 Minute Walk   Phase Initial     Distance 905 feet     Walk Time 6 minutes     # of Rest Breaks 0     MPH 1.7     METS 2.73     RPE 9     Perceived Dyspnea  1     VO2 Peak 9.5     Symptoms No     Resting HR 86 bpm     Resting BP 106/58     Resting Oxygen Saturation  99 %     Exercise Oxygen Saturation  during 6 min  walk 96 %     Max Ex. HR 113 bpm     Max Ex. BP 144/60     2 Minute Post BP 108/58              Oxygen Initial Assessment:   Oxygen Re-Evaluation:   Oxygen Discharge (Final Oxygen Re-Evaluation):   Initial Exercise Prescription:  Initial Exercise Prescription - 06/23/20 1600       Date of Initial Exercise RX and Referring Provider   Date 06/23/20    Referring Provider Gollan      Treadmill   MPH 1.7    Grade 1    Minutes 15    METs 2.5      Recumbant Bike   Level 2    RPM 60    Watts 24    Minutes 15    METs 2.5      NuStep   Level 2    SPM 80    Minutes 15    METs 2.5      Recumbant Elliptical   Level 1    RPM 50    Watts 24    Minutes 15    METs 2.5      Prescription Details   Frequency (times per week) 3    Duration Progress to 30 minutes of continuous aerobic without signs/symptoms of physical distress      Intensity   Ratings of Perceived Exertion 11-15    Perceived Dyspnea 0-4      Resistance Training   Training Prescription Yes    Weight 3 lb    Reps 10-15             Perform Capillary Blood Glucose checks as needed.  Exercise Prescription Changes:   Exercise Prescription Changes     Row Name 06/23/20 1600             Response to Exercise   Blood Pressure (Admit) 106/58       Blood Pressure (Exercise)  144/60       Blood Pressure (Exit) 108/58       Heart Rate (Admit) 86 bpm       Heart Rate (Exercise) 113 bpm       Heart Rate (Exit) 87 bpm       Oxygen Saturation (Admit) 99 %       Oxygen Saturation (Exercise) 96 %       Rating of Perceived Exertion (Exercise) 9       Perceived Dyspnea (Exercise) 1       Symptoms none                Exercise Comments:   Exercise Goals and Review:   Exercise Goals     Row Name 06/23/20 1609             Exercise Goals   Increase Physical Activity Yes       Intervention Provide advice, education, support and counseling about physical activity/exercise  needs.;Develop an individualized exercise prescription for aerobic and resistive training based on initial evaluation findings, risk stratification, comorbidities and participant's personal goals.       Expected Outcomes Short Term: Attend rehab on a regular basis to increase amount of physical activity.;Long Term: Add in home exercise to make exercise part of routine and to increase amount of physical activity.;Long Term: Exercising regularly at least 3-5 days a week.       Increase Strength and Stamina Yes       Intervention Provide advice, education, support and counseling about physical activity/exercise needs.;Develop an individualized exercise prescription for aerobic and resistive training based on initial evaluation findings, risk stratification, comorbidities and participant's personal goals.       Expected Outcomes Short Term: Increase workloads from initial exercise prescription for resistance, speed, and METs.;Short Term: Perform resistance training exercises routinely during rehab and add in resistance training at home;Long Term: Improve cardiorespiratory fitness, muscular endurance and strength as measured by increased METs and functional capacity (6MWT)       Able to understand and use rate of perceived exertion (RPE) scale Yes       Intervention Provide education and explanation on how to use RPE scale       Expected Outcomes Short Term: Able to use RPE daily in rehab to express subjective intensity level;Long Term:  Able to use RPE to guide intensity level when exercising independently       Able to understand and use Dyspnea scale Yes       Intervention Provide education and explanation on how to use Dyspnea scale       Expected Outcomes Short Term: Able to use Dyspnea scale daily in rehab to express subjective sense of shortness of breath during exertion;Long Term: Able to use Dyspnea scale to guide intensity level when exercising independently       Knowledge and understanding of  Target Heart Rate Range (THRR) Yes       Intervention Provide education and explanation of THRR including how the numbers were predicted and where they are located for reference       Expected Outcomes Short Term: Able to state/look up THRR;Short Term: Able to use daily as guideline for intensity in rehab;Long Term: Able to use THRR to govern intensity when exercising independently       Able to check pulse independently Yes       Intervention Provide education and demonstration on how to check pulse in carotid and radial arteries.;Review the importance of being  able to check your own pulse for safety during independent exercise       Expected Outcomes Short Term: Able to explain why pulse checking is important during independent exercise;Long Term: Able to check pulse independently and accurately       Understanding of Exercise Prescription Yes       Intervention Provide education, explanation, and written materials on patient's individual exercise prescription       Expected Outcomes Short Term: Able to explain program exercise prescription;Long Term: Able to explain home exercise prescription to exercise independently                Exercise Goals Re-Evaluation :   Discharge Exercise Prescription (Final Exercise Prescription Changes):  Exercise Prescription Changes - 06/23/20 1600       Response to Exercise   Blood Pressure (Admit) 106/58    Blood Pressure (Exercise) 144/60    Blood Pressure (Exit) 108/58    Heart Rate (Admit) 86 bpm    Heart Rate (Exercise) 113 bpm    Heart Rate (Exit) 87 bpm    Oxygen Saturation (Admit) 99 %    Oxygen Saturation (Exercise) 96 %    Rating of Perceived Exertion (Exercise) 9    Perceived Dyspnea (Exercise) 1    Symptoms none             Nutrition:  Target Goals: Understanding of nutrition guidelines, daily intake of sodium <1528m, cholesterol <203m calories 30% from fat and 7% or less from saturated fats, daily to have 5 or more  servings of fruits and vegetables.  Education: All About Nutrition: -Group instruction provided by verbal, written material, interactive activities, discussions, models, and posters to present general guidelines for heart healthy nutrition including fat, fiber, MyPlate, the role of sodium in heart healthy nutrition, utilization of the nutrition label, and utilization of this knowledge for meal planning. Follow up email sent as well. Written material given at graduation.   Biometrics:  Pre Biometrics - 06/23/20 1610       Pre Biometrics   Height '5\' 5"'  (1.651 m)    Weight 232 lb 1.6 oz (105.3 kg)    BMI (Calculated) 38.62    Single Leg Stand 5.28 seconds              Nutrition Therapy Plan and Nutrition Goals:  Nutrition Therapy & Goals - 06/17/20 0850       Intervention Plan   Intervention Prescribe, educate and counsel regarding individualized specific dietary modifications aiming towards targeted core components such as weight, hypertension, lipid management, diabetes, heart failure and other comorbidities.    Expected Outcomes Short Term Goal: Understand basic principles of dietary content, such as calories, fat, sodium, cholesterol and nutrients.;Short Term Goal: A plan has been developed with personal nutrition goals set during dietitian appointment.;Long Term Goal: Adherence to prescribed nutrition plan.             Nutrition Assessments:  MEDIFICTS Score Key: ?70 Need to make dietary changes  40-70 Heart Healthy Diet ? 40 Therapeutic Level Cholesterol Diet  [facility]  Picture Your Plate Total Score on Admission 68      Picture Your Plate Scores: <4<23nhealthy dietary pattern with much room for improvement. 41-50 Dietary pattern unlikely to meet recommendations for good health and room for improvement. 51-60 More healthful dietary pattern, with some room for improvement.  >60 Healthy dietary  pattern, although there may be some specific behaviors  that could be improved.    Nutrition Goals Re-Evaluation:   Nutrition Goals Discharge (Final Nutrition Goals Re-Evaluation):   Psychosocial: Target Goals: Acknowledge presence or absence of significant depression and/or stress, maximize coping skills, provide positive support system. Participant is able to verbalize types and ability to use techniques and skills needed for reducing stress and depression.   Education: Stress, Anxiety, and Depression - Group verbal and visual presentation to define topics covered.  Reviews how body is impacted by stress, anxiety, and depression.  Also discusses healthy ways to reduce stress and to treat/manage anxiety and depression.  Written material given at graduation.   Education: Sleep Hygiene -Provides group verbal and written instruction about how sleep can affect your health.  Define sleep hygiene, discuss sleep cycles and impact of sleep habits. Review good sleep hygiene tips.    Initial Review & Psychosocial Screening:  Initial Psych Review & Screening - 06/17/20 0840       Initial Review   Current issues with Current Stress Concerns    Source of Stress Concerns Chronic Illness;Financial;Occupation    Comments Still social distancing and wearing mask, still paranoid, history of anxiety, still recovering from surgery, still worring about finanances on short term disabilty and currently thinking about applying for disability overall      Family Dynamics   Good Support System? Yes   husband, friends, father (65), 2 daughters, 1 son, 7 grandchilden, 4 brothers   Comments Big support system and she can call any anytime      Barriers   Psychosocial barriers to participate in program The patient should benefit from training in stress management and relaxation.;Psychosocial barriers identified (see note)      Screening Interventions   Interventions Encouraged to exercise;To provide support  and resources with identified psychosocial needs;Provide feedback about the scores to participant    Expected Outcomes Short Term goal: Utilizing psychosocial counselor, staff and physician to assist with identification of specific Stressors or current issues interfering with healing process. Setting desired goal for each stressor or current issue identified.;Long Term Goal: Stressors or current issues are controlled or eliminated.;Short Term goal: Identification and review with participant of any Quality of Life or Depression concerns found by scoring the questionnaire.;Long Term goal: The participant improves quality of Life and PHQ9 Scores as seen by post scores and/or verbalization of changes             Quality of Life Scores:   Quality of Life - 06/23/20 1615       Quality of Life   Select Quality of Life      Quality of Life Scores   Health/Function Pre 23.57 %    Socioeconomic Pre 23.26 %    Psych/Spiritual Pre 28.29 %    Family Pre 26.7 %    GLOBAL Pre 24.96 %            Scores of 19 and below usually indicate a poorer quality of life in these areas.  A difference of  2-3 points is a clinically meaningful difference.  A difference of 2-3 points in the total score of the Quality of Life Index has been associated with significant improvement in overall quality of life, self-image, physical symptoms, and general health in studies assessing change in quality of life.  PHQ-9: Recent Review Flowsheet Data     Depression screen [facility] 2/9 06/23/2020 03/20/2020 10/24/2019 09/20/2019 02/20/2019   Decreased Interest 0 0 0 0 0   Down, Depressed, Hopeless 0 0 0  0 0   PHQ - 2 Score 0 0 0 0 0   Altered sleeping 2 - - - -   Tired, decreased energy 1 - - - -   Change in appetite 0 - - - -   Feeling bad or failure about yourself  0 - - - -   Trouble concentrating 0 - - - -   Moving slowly or fidgety/restless 0 - - - -   PHQ-9 Score 3 - - - -   Difficult doing work/chores Somewhat difficult -  - - -      Interpretation of Total Score  Total Score Depression Severity:  1-4 = Minimal depression, 5-9 = Mild depression, 10-14 = Moderate depression, 15-19 = Moderately severe depression, 20-27 = Severe depression   Psychosocial Evaluation and Intervention:  Psychosocial Evaluation - 06/17/20 0842       Psychosocial Evaluation & Interventions   Interventions Encouraged to exercise with the program and follow exercise prescription;Stress management education;Relaxation education    Comments Savannah is coming into rehab after a valve replacement.  Her valve has been limiting her from doing everything she wants to do.  When traveling and doing things with her family, she has had to sit and be left behind to catch her breath and feel better.  She has a trip planning to [facility] in October for her and her husbands anniversary.  Her biggest stressor is her hob.  Her job is very stressful and she is trying to decide about disability.  She is interested in maybe vocational rehab to help switch up her job.  One of her current stressors also is finances surrounding all of this and being out on short term disability and still being able to make payments.  She has a great support system in her family that she can call upon at any point.  Prior to this, she had never heard of cardiac rehab and is looking forward to learning and absorbing as much as she can.  She wants to get back to her normal self to be able to travel and go again.  She would also like to continue to lose weight.    Expected Outcomes Short: Attend rehab to learn about self and boost health Long: Continue to improve stamina    Continue Psychosocial Services  Follow up required by staff             Psychosocial Re-Evaluation:   Psychosocial Discharge (Final Psychosocial Re-Evaluation):   Vocational Rehabilitation: Provide vocational rehab assistance to qualifying candidates.   Vocational Rehab Evaluation & Intervention:   Vocational Rehab - 06/17/20 0846       Initial Vocational Rehab Evaluation & Intervention   Assessment shows need for Vocational Rehabilitation Yes   pt trying to decided about disbabilty or new job            Education: Education Goals: Education classes will be provided on a variety of topics geared toward better understanding of heart health and risk factor modification. Participant will state understanding/return demonstration of topics presented as noted by education test scores.  Learning Barriers/Preferences:  Learning Barriers/Preferences - 06/17/20 3790       Learning Barriers/Preferences   Learning Barriers Sight   wears bifocal glasses   Learning Preferences None             General Cardiac Education Topics:  AED/CPR: - Group verbal and written instruction with the use of models to demonstrate the basic use of the AED  with the basic ABC's of resuscitation.   Anatomy and Cardiac Procedures: - Group verbal and visual presentation and models provide information about basic cardiac anatomy and function. Reviews the testing methods done to diagnose heart disease and the outcomes of the test results. Describes the treatment choices: Medical Management, Angioplasty, or Coronary Bypass Surgery for treating various heart conditions including Myocardial Infarction, Angina, Valve Disease, and Cardiac Arrhythmias.  Written material given at graduation.   Medication Safety: - Group verbal and visual instruction to review commonly prescribed medications for heart and lung disease. Reviews the medication, class of the drug, and side effects. Includes the steps to properly store meds and maintain the prescription regimen.  Written material given at graduation.   Intimacy: - Group verbal instruction through game format to discuss how heart and lung disease can affect sexual intimacy. Written material given at graduation..   Know Your Numbers and Heart Failure: - Group verbal  and visual instruction to discuss disease risk factors for cardiac and pulmonary disease and treatment options.  Reviews associated critical values for Overweight/Obesity, Hypertension, Cholesterol, and Diabetes.  Discusses basics of heart failure: signs/symptoms and treatments.  Introduces Heart Failure Zone chart for action plan for heart failure.  Written material given at graduation.   Infection Prevention: - Provides verbal and written material to individual with discussion of infection control including proper hand washing and proper equipment cleaning during exercise session. Flowsheet Row Cardiac Rehab from 06/23/2020 in [facility] Cardiac and Pulmonary Rehab  Date 06/23/20  Educator AS  Instruction Review Code 1- Verbalizes Understanding       Falls Prevention: - Provides verbal and written material to individual with discussion of falls prevention and safety. Flowsheet Row Cardiac Rehab from 06/23/2020 in [facility]  Date 06/23/20  Educator AS  Instruction Review Code 1- Verbalizes Understanding       Other: -Provides group and verbal instruction on various topics (see comments)   Knowledge Questionnaire Score:  Knowledge Questionnaire Score - 06/23/20 1619       Knowledge Questionnaire Score   Pre Score 22/26 nutrition angina             Core Components/Risk Factors/Patient Goals at Admission:  Personal Goals and Risk Factors at Admission - 06/23/20 1620       Core Components/Risk Factors/Patient Goals on Admission    Weight Management Yes;Obesity;Weight Loss    Intervention Weight Management: Develop a combined nutrition and exercise program designed to reach desired caloric intake, while maintaining appropriate intake of nutrient and fiber, sodium and fats, and appropriate energy expenditure required for the weight goal.;Weight Management: Provide education and appropriate resources to help participant work on and attain dietary goals.;Weight  Management/Obesity: Establish reasonable short term and long term weight goals.;Obesity: Provide education and appropriate resources to help participant work on and attain dietary goals.    Expected Outcomes Short Term: Continue to assess and modify interventions until short term weight is achieved;Long Term: Adherence to nutrition and physical activity/exercise program aimed toward attainment of established weight goal;Weight Loss: Understanding of general recommendations for a balanced deficit meal plan, which promotes 1-2 lb weight loss per week and includes a negative energy balance of [phone] kcal/d;Understanding recommendations for meals to include 15-35% energy as protein, 25-35% energy from fat, 35-60% energy from carbohydrates, less than 259m of dietary cholesterol, 20-35 gm of total fiber daily;Understanding of distribution of calorie intake throughout the day with the consumption of 4-5 meals/snacks    Heart Failure Yes  Intervention Provide a combined exercise and nutrition program that is supplemented with education, support and counseling about heart failure. Directed toward relieving symptoms such as shortness of breath, decreased exercise tolerance, and extremity edema.    Expected Outcomes Improve functional capacity of life;Short term: Attendance in program 2-3 days a week with increased exercise capacity. Reported lower sodium intake. Reported increased fruit and vegetable intake. Reports medication compliance.;Short term: Daily weights obtained and reported for increase. Utilizing diuretic protocols set by physician.;Long term: Adoption of self-care skills and reduction of barriers for early signs and symptoms recognition and intervention leading to self-care maintenance.    Hypertension Yes    Intervention Provide education on lifestyle modifcations including regular physical activity/exercise, weight management, moderate sodium restriction and increased consumption of fresh fruit,  vegetables, and low fat dairy, alcohol moderation, and smoking cessation.;Monitor prescription use compliance.    Expected Outcomes Short Term: Continued assessment and intervention until BP is < 140/15m HG in hypertensive participants. < 130/859mHG in hypertensive participants with diabetes, heart failure or chronic kidney disease.;Long Term: Maintenance of blood pressure at goal levels.    Lipids Yes    Intervention Provide education and support for participant on nutrition & aerobic/resistive exercise along with prescribed medications to achieve LDL <7072mHDL >74m2m  Expected Outcomes Short Term: Participant states understanding of desired cholesterol values and is compliant with medications prescribed. Participant is following exercise prescription and nutrition guidelines.;Long Term: Cholesterol controlled with medications as prescribed, with individualized exercise RX and with personalized nutrition plan. Value goals: LDL < 70mg7mL > 40 mg.             Education:Diabetes - Individual verbal and written instruction to review signs/symptoms of diabetes, desired ranges of glucose level fasting, after meals and with exercise. Acknowledge that pre and post exercise glucose checks will be done for 3 sessions at entry of program.   Core Components/Risk Factors/Patient Goals Review:    Core Components/Risk Factors/Patient Goals at Discharge (Final Review):    ITP Comments:  ITP Comments     Row Name 06/17/20 0900 06/23/20 1622 07/02/20 0719       ITP Comments Completed virtual orientation today.  EP evaluation is scheduled for Monday 6/6 at 230pm.  [facility]. Completed 6MWT and gym orientation. Initial ITP created and sent for review to Dr. Mark Emily Filbertical Director. 30 Day review completed. Medical Director ITP review done, changes made as directed, and signed approval by Medical Director.   only 2 visits in June               Comments:

## 2020-07-03 ENCOUNTER — Ambulatory Visit: Payer: 59

## 2020-07-04 ENCOUNTER — Ambulatory Visit (INDEPENDENT_AMBULATORY_CARE_PROVIDER_SITE_OTHER): Payer: 59 | Admitting: Physician Assistant

## 2020-07-04 ENCOUNTER — Other Ambulatory Visit: Payer: Self-pay

## 2020-07-04 DIAGNOSIS — Z954 Presence of other heart-valve replacement: Secondary | ICD-10-CM | POA: Diagnosis not present

## 2020-07-04 DIAGNOSIS — R000 Tachycardia, unspecified: Secondary | ICD-10-CM | POA: Diagnosis not present

## 2020-07-04 DIAGNOSIS — Z8679 Personal history of other diseases of the circulatory system: Secondary | ICD-10-CM

## 2020-07-04 DIAGNOSIS — R413 Other amnesia: Secondary | ICD-10-CM

## 2020-07-04 DIAGNOSIS — R42 Dizziness and giddiness: Secondary | ICD-10-CM

## 2020-07-04 DIAGNOSIS — I10 Essential (primary) hypertension: Secondary | ICD-10-CM | POA: Diagnosis not present

## 2020-07-04 DIAGNOSIS — Z9889 Other specified postprocedural states: Secondary | ICD-10-CM

## 2020-07-04 DIAGNOSIS — N1832 Chronic kidney disease, stage 3b: Secondary | ICD-10-CM

## 2020-07-04 DIAGNOSIS — Z79899 Other long term (current) drug therapy: Secondary | ICD-10-CM | POA: Diagnosis not present

## 2020-07-04 LAB — POCT INR: INR: 3.5 — AB (ref 2.0–3.0)

## 2020-07-04 MED ORDER — ALPRAZOLAM 0.5 MG PO TABS: 0.2500 mg | ORAL_TABLET | Freq: Every evening | ORAL | 1 refills | Status: DC | PRN

## 2020-07-04 NOTE — Progress Notes (Signed)
[facility] [place], [place] [address]  Internal MEDICINE  Office Visit Note  Patient Name: Leslie [name]  [id number]  [id number]  Date of Service: 07/06/2020  Chief Complaint  Patient presents with   Follow-up    Memory loss MMSE, lab review, chest tube is still bleeding, pt got dizzy and light headed and fell last Thursday, right knee hurts, has been feeling light headed recently, inside chest sternum hurts, heart rate seems to be high, difficulty staying asleep, review meds   Quality Metric Gaps    Pneumovax, shingrix, mammogram, covid booster    HPI Patient is here for routine follow-up.  Originally scheduled to discuss memory changes and perform Mini-Mental status exam as well as review labs.  However patient has been experiencing acute symptoms that need to be addressed at this time -Pt has been dizzy off and on for the last few weeks and just feels off. Had a fall due to dizziness and kit right shoulderr which is bruised and right knee. Did not hit head or LOC. -went back to see cardiac surgeon for follow up and discussed some of the dizziness and weeping chest tube site wound, but states she was cleared from a surgery perspective. Had a chest xray that looked good. -Sees Dr. Rockey Situ on Monday and will discuss with him further.  Patient also states that heart rate has remained above 100 even at rest while at home and was elevated initially in office today.  Based on symptoms and heart rate went ahead and ordered an EKG in office today.  Patient may require further monitoring with long-term monitor to be done via cardiology.  We will send a message to Dr. Donivan Scull office regarding today's visit and concerns based on symptoms.  Of note patient's INR has also been elevated and warfarin dose was recently changed -Never had a sleep study.  Denies any snoring, choking, gasping, witnessed apneas which is confirmed by her husband who is present in the room today.  We  will consider PSG in the future especially given high BMI, chronic heart conditions status post recent operation, and hypertension -Repeat blood pressure in office was 132/72  Current Medication: Outpatient Encounter Medications as of 07/04/2020  Medication Sig Note   acetaminophen (TYLENOL) 500 MG tablet Take 1,000 mg by mouth every 6 (six) hours as needed for moderate pain.    albuterol (VENTOLIN HFA) 108 (90 Base) MCG/ACT inhaler Inhale 2 puffs into the lungs every 6 (six) hours as needed for wheezing or shortness of breath. 06/17/2020: Has not needed it    ALPRAZolam (XANAX) 0.5 MG tablet Take 0.5 tablets (0.25 mg total) by mouth at bedtime as needed for anxiety.    amLODipine (NORVASC) 10 MG tablet Take 1 tablet (10 mg total) by mouth daily.    aspirin EC 81 MG tablet Take 1 tablet (81 mg total) by mouth daily. Swallow whole.    atorvastatin (LIPITOR) 80 MG tablet TAKE 1 TABLET BY MOUTH EVERY DAY AT 6 PM    calcium carbonate (TUMS - DOSED IN MG ELEMENTAL CALCIUM) 500 MG chewable tablet Chew 500 mg by mouth daily as needed for indigestion or heartburn.    ferrous [id number] C-folic acid (TRINSICON / FOLTRIN) capsule Take 1 capsule by mouth 2 (two) times daily after a meal.    fluticasone (FLONASE) 50 MCG/ACT nasal spray Place 1 spray into both nostrils daily as needed for rhinitis.    metoprolol tartrate (LOPRESSOR) 25 MG tablet Take 2 tablets (  50 mg total) by mouth 2 (two) times daily.    omeprazole (PRILOSEC) 40 MG capsule TAKE 1 CAPSULE(40 MG) BY MOUTH DAILY    potassium chloride SA (KLOR-CON) 20 MEQ tablet Take 1 tablet (20 mEq total) by mouth 2 (two) times daily.    spironolactone (ALDACTONE) 25 MG tablet Take 0.5 tablets (12.5 mg total) by mouth daily.    torsemide (DEMADEX) 20 MG tablet Take 2 tablets (40 mg total) by mouth 2 (two) times daily.    warfarin (COUMADIN) 2 MG tablet Take 1 tablet (2 mg total) by mouth daily at 4 PM. As instructed by Coumadin clinic for lifetime     [DISCONTINUED] amoxicillin (AMOXIL) 500 MG tablet Take 2,000 mg by mouth See admin instructions. Take 4 capsules (2000 mg) by mouth 1 hour prior to dental work (Patient not taking: No sig reported)    No facility-administered encounter medications on file as of 07/04/2020.    Surgical History: Past Surgical History:  Procedure Laterality Date   ABDOMINAL HYSTERECTOMY     total   ABDOMINAL HYSTERECTOMY     BREAST BIOPSY Bilateral 2012   BREAST BIOPSY Right 08-07-12   fibroadenomatous changes and columnar cells   BREAST BIOPSY  02/03/2015   stereo byrnett   BUBBLE STUDY  04/01/2020   Procedure: BUBBLE STUDY;  Surgeon: [name], MD;  Location: [place];  Service: Cardiovascular;;   CARDIAC CATHETERIZATION     CHOLECYSTECTOMY N/A 02/27/2016   Procedure: LAPAROSCOPIC CHOLECYSTECTOMY;  Surgeon: Christene Lye, MD;  Location: [facility];  Service: General;  Laterality: N/A;   CLIPPING OF ATRIAL APPENDAGE  04/29/2020   Procedure: CLIPPING OF ATRIAL APPENDAGE USING ATRICURE  PRO2 CLIP SIZE 45MM;  Surgeon: Rexene Alberts, MD;  Location: [facility] OR;  Service: Open Heart Surgery;;   COLONOSCOPY WITH PROPOFOL N/A 09/26/2018   Procedure: COLONOSCOPY WITH PROPOFOL;  Surgeon: Lucilla Lame, MD;  Location: [facility] ENDOSCOPY;  Service: Endoscopy;  Laterality: N/A;   CYSTOSCOPY W/ RETROGRADES Right 08/18/2018   Procedure: CYSTOSCOPY WITH RETROGRADE PYELOGRAM;  Surgeon: Billey Co, MD;  Location: [facility];  Service: Urology;  Laterality: Right;   CYSTOSCOPY/URETEROSCOPY/HOLMIUM LASER/STENT PLACEMENT Right 08/18/2018   Procedure: CYSTOSCOPY/URETEROSCOPY/STENT PLACEMENT;  Surgeon: Billey Co, MD;  Location: [facility];  Service: Urology;  Laterality: Right;   DIAGNOSTIC LAPAROSCOPY     ESOPHAGOGASTRODUODENOSCOPY (EGD) WITH PROPOFOL N/A 09/26/2018   Procedure: ESOPHAGOGASTRODUODENOSCOPY (EGD) WITH PROPOFOL;  Surgeon: Lucilla Lame, MD;  Location: [facility] ENDOSCOPY;  Service: Endoscopy;  Laterality:  N/A;   GIVENS CAPSULE STUDY N/A 11/03/2018   Procedure: GIVENS CAPSULE STUDY;  Surgeon: Lucilla Lame, MD;  Location[facility] END[facility]COPY;  Service: Endoscopy;  Laterality: N/A;   JOINT REPLACEMENT Left    knee   KNEE ARTHROPLASTY Right 04/09/2019   Procedure: COMPUTER ASSISTED TOTAL KNEE ARTHROPLASTY;  Surgeon: Dereck Leep, MD;  Location: [facility];  Service: Orthopedics;  Laterality: Right;   KNEE CLOSED REDUCTION Left 04/15/2015   Procedure: CLOSED MANIPULATION KNEE;  Surgeon: Thornton Park, MD;  Location: [facility];  Service: Orthopedics;  Laterality: Left;   KNEE SURGERY     MAZE N/A 04/29/2020   Procedure: MAZE;  Surgeon: Rexene Alberts, MD;  Location: [place];  Service: Open Heart Surgery;  Laterality: N/A;   MITRAL VALVE REPLACEMENT N/A 04/29/2020   Procedure: MITRAL VALVE (MV) REPLACEMENT USING ON-X VALVE SIZE 27/29MM;  Surgeon: Rexene Alberts, MD;  Location: [place];  Service: Open Heart Surgery;  Laterality: N/A;   MULTIPLE EXTRACTIONS  WITH ALVEOLOPLASTY N/A 02/28/2020   Procedure: MULTIPLE EXTRACTION WITH ALVEOLOPLASTY;  Surgeon: [name], DMD;  Location: [place];  Service: Dentistry;  Laterality: N/A;   RIGHT/LEFT HEART CATH AND CORONARY ANGIOGRAPHY Bilateral 09/06/2019   Procedure: RIGHT/LEFT HEART CATH AND CORONARY ANGIOGRAPHY;  Surgeon: Minna Merritts, MD;  Location: [place] CV LAB;  Service: Cardiovascular;  Laterality: Bilateral;   TEE WITHOUT CARDIOVERSION N/A 09/13/2017   Procedure: TRANSESOPHAGEAL ECHOCARDIOGRAM (TEE);  Surgeon: Nelva Bush, MD;  Location: [facility];  Service: Cardiovascular;  Laterality: N/A;   TEE WITHOUT CARDIOVERSION N/A 04/01/2020   Procedure: TRANSESOPHAGEAL ECHOCARDIOGRAM (TEE);  Surgeon: [name], MD;  Location: [facility] ENDOSCOPY;  Service: Cardiovascular;  Laterality: N/A;   TEE WITHOUT CARDIOVERSION N/A 04/29/2020   Procedure: TRANSESOPHAGEAL ECHOCARDIOGRAM (TEE);  Surgeon: Rexene Alberts, MD;  Location: [place];  Service: Open  Heart Surgery;  Laterality: N/A;   TOTAL KNEE ARTHROPLASTY Left 12/25/2014   Procedure: TOTAL KNEE ARTHROPLASTY;  Surgeon: Thornton Park, MD;  Location: [facility];  Service: Orthopedics;  Laterality: Left;   TUBAL LIGATION      Medical History: Past Medical History:  Diagnosis Date   (HFpEF) heart failure with preserved ejection fraction ([place])    a. 08/2017 Echo: EF 55-60%.  Grade 2 diastolic dysfunction.  Moderate mitral stenosis.   Allergy    Anemia    Anxiety    BRCA negative 03/22/2013   Bronchitis 02/19/2016   ON LEVAQUIN PO   Chest tightness    CHF (congestive heart failure) (HCC)    Cigarette smoker 09/11/2017   8-10 day   Dyspnea    Dysrhythmia    GERD (gastroesophageal reflux disease)    Heart murmur    Per pt. dx by Dr. Roxy Manns via stethescope   History of kidney stones    Hyperlipidemia    Hypertension    Interstitial lung disease ([place])    a. CT 2013 b. 02/2018 CXR noted recurrent intersistial changes ILD vs chronic bronchitis   Moderate mitral stenosis    a.  08/2017 TEE: EF 60 to 65%.  Moderate mitral stenosis.  Mean gradient 14 mmHg.  Valve area 2.59 cm by planimetry, 2.72 cm by pressure half-time.   Persistent atrial fibrillation ([place])    a. 08/2017 s/p TEE/DCCV; b. CHA2DS2VASc = 2-->warfarin.   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 04/29/2020   27/29 mm Onyx-Valve    Family History: Family History  Problem Relation Age of Onset   Cancer Mother 19       breast   Hypertension Mother    Cancer Maternal Aunt        breast   Cancer Maternal Grandmother        breast   Osteoarthritis Father    Hypertension Father     Social History   Socioeconomic History   Marital status: Married    Spouse name: Not on file   Number of children: Not on  file   Years of education: Not on file   Highest education level: Not on file  Occupational History   Not on file  Tobacco Use   Smoking status: Former    Packs/day: 0.50    Years: 20.00    Pack years: 10.00    Types: Cigarettes    Quit date: 01/2017  Years since quitting: 3.4   Smokeless tobacco: Never  Vaping Use   Vaping Use: Never used  Substance and Sexual Activity   Alcohol use: Yes    Comment: rarely   Drug use: No   Sexual activity: Not on file  Other Topics Concern   Not on file  Social History Narrative   Not on file   Social Determinants of Health   Financial Resource Strain: Low Risk    Difficulty of Paying Living Expenses: Not very hard  Food Insecurity: No Food Insecurity   Worried About Running Out of Food in the Last Year: Never true   Ran Out of Food in the Last Year: Never true  Transportation Needs: No Transportation Needs   Lack of Transportation (Medical): No   Lack of Transportation (Non-Medical): No  Physical Activity: Not on file  Stress: Not on file  Social Connections: Not on file  Intimate Partner Violence: Not on file      Review of Systems  Constitutional:  Positive for fatigue. Negative for chills and unexpected weight change.  HENT:  Negative for congestion, postnasal drip, rhinorrhea, sneezing and sore throat.   Eyes:  Negative for redness.  Respiratory:  Negative for cough, chest tightness and shortness of breath.   Cardiovascular:  Positive for palpitations. Negative for chest pain.  Gastrointestinal:  Negative for abdominal pain, constipation, diarrhea, nausea and vomiting.  Genitourinary:  Negative for dysuria and frequency.  Musculoskeletal:  Negative for arthralgias, back pain, joint swelling and neck pain.       Neck stiff and pain along right side of neck into arm  Skin:  Positive for color change. Negative for rash.       Bruise on right shoulder from fall  Neurological:  Positive for dizziness. Negative for tremors  and numbness.  Hematological:  Negative for adenopathy. Bruises/bleeds easily.  Psychiatric/Behavioral:  Positive for sleep disturbance. Negative for behavioral problems (Depression) and suicidal ideas. The patient is not nervous/anxious.        More forgetful lately   Vital Signs: BP (!) 148/76   Pulse 100   Temp (!) 97.4 F (36.3 C)   Resp 16   Ht _0  (1.626 m)   Wt 231 lb 12.8 oz (105.1 kg)   SpO2 98%   BMI 39.79 kg/m    Physical Exam Vitals and nursing note reviewed.  Constitutional:      General: She is not in acute distress.    Appearance: She is well-developed. She is obese. She is not diaphoretic.  HENT:     Head: Normocephalic and atraumatic.     Mouth/Throat:     Pharynx: No oropharyngeal exudate.  Eyes:     Pupils: Pupils are equal, round, and reactive to light.  Neck:     Thyroid: No thyromegaly.     Vascular: No JVD.     Trachea: No tracheal deviation.  Cardiovascular:     Rate and Rhythm: Regular rhythm. Tachycardia present.     Heart sounds: Normal heart sounds. No murmur heard.   No friction rub. No gallop.  Pulmonary:     Effort: Pulmonary effort is normal. No respiratory distress.     Breath sounds: No wheezing or rales.  Chest:     Chest wall: No tenderness.  Abdominal:     General: Bowel sounds are normal.     Palpations: Abdomen is soft.  Musculoskeletal:        General: Normal range of motion.  Cervical back: Normal range of motion and neck supple.  Lymphadenopathy:     Cervical: No cervical adenopathy.  Skin:    General: Skin is warm and dry.     Findings: Bruising present.     Comments: Bruising right shoulder from fall, on warfarin  Neurological:     Mental Status: She is alert and oriented to person, place, and time.     Cranial Nerves: No cranial nerve deficit.  Psychiatric:        Behavior: Behavior normal.        Thought Content: Thought content normal.        Judgment: Judgment normal.       Assessment/Plan: 1.  Dizziness Educated to change position slowly and stay well hydrated. Will discuss further with Dr. Rockey Situ on Monday--may need long term heart monitor. Will also update CBC/CMP.  2. Tachycardia Continue metoprolol as prescribed. Will discuss with cardiology--may need long term monitor. Pt also given xanax to be used as needed for acute anxiety that may contribute to worsening palpitations. - EKG [id number] - Basic Metabolic Panel (BMET) - CBC with Differential/Platelet  3. Primary hypertension Continue current medications, BP improved on recheck  4. S/P mitral valve replacement with Onyx bileaflet mechanical valve Followed by cardiology  5. S/P Maze operation for atrial fibrillation Followed by cardiology  6. Encounter for long-term (current) use of high-risk medication - POCT INR 3.8 - Basic Metabolic Panel (BMET) - CBC with Differential/Platelet  7. Stage 3b chronic kidney disease ([place]) Followed by nephrology  8. Memory loss Thyroid panel updated, but B12/folate not done--will need to update and perform MMSE once acute symptoms under better control.    General Counseling: Izela verbalizes understanding of the findings of todays visit and agrees with plan of treatment. I have discussed any further diagnostic evaluation that may be needed or ordered today. We also reviewed her medications today. she has been encouraged to call the office with any questions or concerns that should arise related to todays visit.    Orders Placed This Encounter  Procedures   Basic Metabolic Panel (BMET)   CBC with Differential/Platelet   POCT INR   EKG 12-Lead    Meds ordered this encounter  Medications   ALPRAZolam (XANAX) 0.5 MG tablet    Sig: Take 0.5 tablets (0.25 mg total) by mouth at bedtime as needed for anxiety.    Dispense:  30 tablet    Refill:  1    This patient was seen by Drema Dallas, PA-C in collaboration with Dr. Clayborn Bigness as a part of collaborative care  agreement.   Total time spent:45 Minutes Time spent includes review of chart, medications, test results, and follow up plan with the patient. Patient has multiple serious comorbidities that required high level critical thinking and management.     Dr Lavera Guise Internal medicine

## 2020-07-06 NOTE — Progress Notes (Signed)
Date:  07/07/2020   ID:  Leslie [name], DOB 13-Sep-1965, MRN [id number]   PCP:  Lavera Guise, MD  Cardiologist:  Ida Rogue, MD  Electrophysiologist:  None   Evaluation Performed:  Follow-Up Visit   Chief Complaint  Patient presents with   1 month follow up     Patient c/o ankle swelling, dizziness & lightheadedness, fell to the floor while getting out of bed last week, shortness of breath, fatigue and some mid-sternum pain. Medications reviewed by the patient verbally.     History of Present Illness:    Leslie [name] is a 55 y.o. female with  mitral valve stenosis morbid obesity,  Snores, possible OSA anxiety,  hyperlipidemia,  Hospital admission 09/11/2017 for  shortness of breath chest tightness in atrial fibrillation with RVR Had TEE  , Cardioversion Mild COPD, long hx of smoking Cardiac CTA Coronary calcium score of 10. Who presents for follow up of her paroxysmal atrial fibrillation, mitral valve stenosis, s/p mechanical MVR, PAF s/p maze,  Last seen in clinic by myself December 2021 Seen by one of our providers May 2022  transesophageal echocardiogram 04/01/2020-confirming rheumatic heart disease with severe mitral stenosis.    04/29/2020 she underwent median sternotomy, mitral valve replacement, MAZE.  Coumadin was initiated due to mechanical MVR  Feels tachycardia Felt better on metoprolol succinate Not as good on metoprolol tartrate  INR 3.5  Started cardiac rehab "Not ready to go back to work" Forgetful Left arm ulner nerve issue, numb, can't move it well Seeing ortho July 5th  Stay taking torsemide 40 twice daily  Echo 05/2020 Close to normal cardiac function Prosthetic mitral valve working well  EKG personally reviewed by myself on todays visit Shows normal sinus rhythm rate 76 bpm no significant ST-T wave changes  Other past medical history reviewed Echocardiogram August 2020  mitral valve Moderate thickening of the mitral valve  leaflet. Moderate-severe mitral valve stenosis, with mean gradient of 16 mmHg and MVA by continuity equation of 1.0 cm^2.    Transesophageal echo August 2019 with moderate mitral valve stenosis mean gradient of 14 Mobility of the posterior leaflet was restricted.    summer 2020 developed right flank pain,   underwent right ureteroscopy, laser lithotripsy, and stent placement on 08/18/2018.  Had pyelonephritis post procedure  Creatinine trended up  to 4.1  acute kidney injury and sepsis in the setting of recent right ureteral stone status post lithotripsy and likely pyelonephritis.   atrial fibrillation December 2019 01/05/2018 awoke suddenly with tachypalpitations  rates in the 130s   EMS was called, noted to be in sinus rhythm with heart rates in the 80s to 90s bpm.     Cardiac CTA 1. Coronary calcium score of 10. This was 37 percentile for age and sex matched control.. Normal coronary origin with right dominance.Mild non-obstructive CAD.  Past Medical History:  Diagnosis Date   (HFpEF) heart failure with preserved ejection fraction ([place])    a. 08/2017 Echo: EF 55-60%.  Grade 2 diastolic dysfunction.  Moderate mitral stenosis.   Allergy    Anemia    Anxiety    BRCA negative 03/22/2013   Bronchitis 02/19/2016   ON LEVAQUIN PO   Chest tightness    CHF (congestive heart failure) (HCC)    Cigarette smoker 09/11/2017   8-10 day   Dyspnea    Dysrhythmia    GERD (gastroesophageal reflux disease)    Heart murmur    Per pt. dx by Dr.  Roxy Manns via stethescope   History of kidney stones    Hyperlipidemia    Hypertension    Interstitial lung disease ([place])    a. CT 2013 b. 02/2018 CXR noted recurrent intersistial changes ILD vs chronic bronchitis   Moderate mitral stenosis    a.  08/2017 TEE: EF 60 to 65%.  Moderate mitral stenosis.  Mean gradient 14 mmHg.  Valve area 2.59 cm by planimetry, 2.72 cm by pressure half-time.   Persistent atrial fibrillation ([place])    a. 08/2017 s/p TEE/DCCV; b.  CHA2DS2VASc = 2-->warfarin.   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 04/29/2020   27/29 mm Onyx-Valve   Past Surgical History:  Procedure Laterality Date   ABDOMINAL HYSTERECTOMY     total   ABDOMINAL HYSTERECTOMY     BREAST BIOPSY Bilateral 2012   BREAST BIOPSY Right 08-07-12   fibroadenomatous changes and columnar cells   BREAST BIOPSY  02/03/2015   stereo byrnett   BUBBLE STUDY  04/01/2020   Procedure: BUBBLE STUDY;  Surgeon: [name], MD;  Location: [facility];  Service: Cardiovascular;;   CARDIAC CATHETERIZATION     CHOLECYSTECTOMY N/A 02/27/2016   Procedure: LAPAROSCOPIC CHOLECYSTECTOMY;  Surgeon: Christene Lye, MD;  Location: [facility];  Service: General;  Laterality: N/A;   CLIPPING OF ATRIAL APPENDAGE  04/29/2020   Procedure: CLIPPING OF ATRIAL APPENDAGE USING ATRICURE  PRO2 CLIP SIZE 45MM;  Surgeon: Rexene Alberts, MD;  Location: [facility] OR;  Service: Open Heart Surgery;;   COLONOSCOPY WITH PROPOFOL N/A 09/26/2018   Procedure: COLONOSCOPY WITH PROPOFOL;  Surgeon: Lucilla Lame, MD;  Location: [facility] ENDOSCOPY;  Service: Endoscopy;  Laterality: N/A;   CYSTOSCOPY W/ RETROGRADES Right 08/18/2018   Procedure: CYSTOSCOPY WITH RETROGRADE PYELOGRAM;  Surgeon: Billey Co, MD;  Location: [facility];  Service: Urology;  Laterality: Right;   CYSTOSCOPY/URETEROSCOPY/HOLMIUM LASER/STENT PLACEMENT Right 08/18/2018   Procedure: CYSTOSCOPY/URETEROSCOPY/STENT PLACEMENT;  Surgeon: Billey Co, MD;  Location: [facility];  Service: Urology;  Laterality: Right;   DIAGNOSTIC LAPAROSCOPY     ESOPHAGOGASTRODUODENOSCOPY (EGD) WITH PROPOFOL N/A 09/26/2018   Procedure: ESOPHAGOGASTRODUODENOSCOPY (EGD) WITH PROPOFOL;  Surgeon: Lucilla Lame, MD;  Location: [facility] ENDOSCOPY;  Service:  Endoscopy;  Laterality: N/A;   GIVENS CAPSULE STUDY N/A 11/03/2018   Procedure: GIVENS CAPSULE STUDY;  Surgeon: Lucilla Lame, MD;  Location: [facility] ENDOSCOPY;  Service: Endoscopy;  Laterality: N/A;   JOINT REPLACEMENT Left    knee   KNEE ARTHROPLASTY Right 04/09/2019   Procedure: COMPUTER ASSISTED TOTAL KNEE ARTHROPLASTY;  Surgeon: Dereck Leep, MD;  Location: [facility];  Service: Orthopedics;  Laterality: Right;   KNEE CLOSED REDUCTION Left 04/15/2015   Procedure: CLOSED MANIPULATION KNEE;  Surgeon: Thornton Park, MD;  Location: [facility];  Service: Orthopedics;  Laterality: Left;   KNEE SURGERY     MAZE N/A 04/29/2020   Procedure: MAZE;  Surgeon: Rexene Alberts, MD;  Location: [facility];  Service: Open Heart Surgery;  Laterality: N/A;   MITRAL VALVE REPLACEMENT N/A 04/29/2020   Procedure: MITRAL VALVE (MV) REPLACEMENT USING ON-X VALVE SIZE 27/29MM;  Surgeon: Rexene Alberts, MD;  Location: [facility];  Service: Open Heart Surgery;  Laterality: N/A;   MULTIPLE EXTRACTIONS WITH ALVEOLOPLASTY N/A 02/28/2020   Procedure: MULTIPLE EXTRACTION WITH ALVEOLOPLASTY;  Surgeon: [name], DMD;  Location: [facility];  Service: Dentistry;  Laterality: N/A;   RIGHT/LEFT HEART CATH AND CORONARY ANGIOGRAPHY Bilateral 09/06/2019   Procedure: RIGHT/LEFT HEART CATH AND CORONARY ANGIOGRAPHY;  Surgeon: Minna Merritts, MD;  Location: [place] CV LAB;  Service: Cardiovascular;  Laterality: Bilateral;   TEE WITHOUT CARDIOVERSION N/A 09/13/2017   Procedure: TRANSESOPHAGEAL ECHOCARDIOGRAM (TEE);  Surgeon: Nelva Bush, MD;  Location: [facility];  Service: Cardiovascular;  Laterality: N/A;   TEE WITHOUT CARDIOVERSION N/A 04/01/2020   Procedure: TRANSESOPHAGEAL ECHOCARDIOGRAM (TEE);  Surgeon: [name], MD;  Location: [facility] ENDOSCOPY;  Service: Cardiovascular;  Laterality: N/A;   TEE WITHOUT CARDIOVERSION N/A 04/29/2020   Procedure: TRANSESOPHAGEAL ECHOCARDIOGRAM (TEE);  Surgeon: Rexene Alberts, MD;  Location: [facility];  Service: Open Heart Surgery;  Laterality: N/A;   TOTAL KNEE ARTHROPLASTY Left 12/25/2014   Procedure: TOTAL KNEE ARTHROPLASTY;  Surgeon: Thornton Park, MD;  Location: [facility];  Service: Orthopedics;  Laterality: Left;   TUBAL LIGATION       Current Meds  Medication Sig   acetaminophen (TYLENOL) 500 MG tablet Take 1,000 mg by mouth every 6 (six) hours as needed for moderate pain.   albuterol (VENTOLIN HFA) 108 (90 Base) MCG/ACT inhaler Inhale 2 puffs into the lungs every 6 (six) hours as needed for wheezing or shortness of breath.   ALPRAZolam (XANAX) 0.5 MG tablet Take 0.5 tablets (0.25 mg total) by mouth at bedtime as needed for anxiety.   amLODipine (NORVASC) 10 MG tablet Take 1 tablet (10 mg total) by mouth daily.   aspirin EC 81 MG tablet Take 1 tablet (81 mg total) by mouth daily. Swallow whole.   atorvastatin (LIPITOR) 80 MG tablet TAKE 1 TABLET BY MOUTH EVERY DAY AT 6 PM   calcium carbonate (TUMS - DOSED IN MG ELEMENTAL CALCIUM) 500 MG chewable tablet Chew 500 mg by mouth daily as needed for indigestion or heartburn.   ferrous [id number] C-folic acid (TRINSICON / FOLTRIN) capsule Take 1 capsule by mouth 2 (two) times daily after a meal.   fluticasone (FLONASE) 50 MCG/ACT nasal spray Place 1 spray into both nostrils daily as needed for rhinitis.   metoprolol tartrate (LOPRESSOR) 25 MG tablet Take 2 tablets (50 mg total) by mouth 2 (two) times daily.   omeprazole (PRILOSEC) 40 MG capsule TAKE 1 CAPSULE(40 MG) BY MOUTH DAILY   potassium chloride SA (KLOR-CON) 20 MEQ tablet Take 1 tablet (20 mEq total) by mouth 2 (two) times daily.   spironolactone (ALDACTONE) 25 MG tablet Take 0.5 tablets (12.5 mg total) by mouth daily.   torsemide (DEMADEX) 20 MG tablet Take 2 tablets (40 mg total) by mouth 2 (two) times daily.   warfarin (COUMADIN) 2 MG tablet Take 1 tablet (2 mg total) by mouth daily at 4 PM. As instructed by Coumadin clinic for lifetime     Allergies:   Morphine,  Oxycodone hcl, and Dilaudid [hydromorphone hcl]   Social History   Tobacco Use   Smoking status: Former    Packs/day: 0.50    Years: 20.00    Pack years: 10.00    Types: Cigarettes    Quit date: 01/2017    Years since quitting: 3.4   Smokeless tobacco: Never  Vaping Use   Vaping Use: Never used  Substance Use Topics   Alcohol use: Yes    Comment: rarely   Drug use: No     Family Hx: The patient's family history includes Cancer in her maternal aunt and maternal grandmother; Cancer (age of  onset: 44) in her mother; Hypertension in her father and mother; Osteoarthritis in her father.  ROS:   Please see the history of present illness.    Review of Systems  Constitutional: Negative.   Respiratory: Negative.    Cardiovascular: Negative.   Gastrointestinal: Negative.   Musculoskeletal: Negative.   Neurological: Negative.   Psychiatric/Behavioral: Negative.    All other systems reviewed and are negative.   Labs/Other Tests and Data Reviewed:     Recent Labs: 05/08/2020: Magnesium 2.0 06/02/2020: ALT 18; BNP 116.6; BUN 12; Creatinine, Ser 1.32; Hemoglobin 11.5; Platelets 414; Potassium 3.9; Sodium 140 06/13/2020: TSH 0.222   Recent Lipid Panel Lab Results  Component Value Date/Time   CHOL 204 (H) 03/07/2020 10:39 AM   TRIG 224 (H) 03/07/2020 10:39 AM   HDL 35 (L) 03/07/2020 10:39 AM   CHOLHDL 5.2 09/19/2018 09:32 AM   LDLCALC 129 (H) 03/07/2020 10:39 AM    Wt Readings from Last 3 Encounters:  07/07/20 233 lb 2 oz (105.7 kg)  07/04/20 231 lb 12.8 oz (105.1 kg)  06/23/20 232 lb 1.6 oz (105.3 kg)     Objective:    Vital Signs:  BP 130/62 (BP Location: Left Arm, Patient Position: Sitting, Cuff Size: Normal)   Pulse 76   Ht '5\' 4"'  (1.626 m)   Wt 233 lb 2 oz (105.7 kg)   SpO2 99%   BMI 40.02 kg/m   Constitutional:  oriented to person, place, and time. No distress.  HENT:  Head: Grossly normal Eyes:  no discharge. No scleral icterus.  Neck: No JVD, no carotid  bruits  Cardiovascular: Regular rate and rhythm, no murmurs appreciated Pulmonary/Chest: Clear to auscultation bilaterally, no wheezes or rails Abdominal: Soft.  no distension.  no tenderness.  Musculoskeletal: Normal range of motion Neurological:  normal muscle tone. Coordination normal. No atrophy Skin: Skin warm and dry Psychiatric: normal affect, pleasant   ASSESSMENT & PLAN:    Mitral valve stenosis With moderate pulmonary hypertension S/p replacement Doing cardiac rehab  2.  Paroxysmal atrial fibrillation On anticoagulation, maintaining normal sinus rhythm At her request, will change back to metoprolol succinate 50 Am, 100 in PM She was taking this regiment prior to surgery and felt better on metoprolol succinate at higher milligram dosing  3.  Morbid obesity Calorie restriction recommended, low grade walking program We have encouraged continued exercise, careful diet management in an effort to lose weight.  4. PAF Metoprolol succinate as above Does not as well on metoprolol tartrate  5.  Diastolic CHF On torsemide 40 twice daily Appears relatively euvolemic  Long discussion concerning her work, she does not feel that she can go back to work, she is forgetful, having numbness fourth and fifth finger left hand, has been referred to orthopedics, has not exercising on a regular basis, only started cardiac rehab, trouble focusing   Total encounter time more than 35 minutes  Greater than 50% was spent in counseling and coordination of care with the patient   Signed, Ida Rogue, MD  07/07/2020 2:15 PM    [place]

## 2020-07-07 ENCOUNTER — Ambulatory Visit (INDEPENDENT_AMBULATORY_CARE_PROVIDER_SITE_OTHER): Payer: 59 | Admitting: Cardiovascular Disease

## 2020-07-07 ENCOUNTER — Encounter: Payer: Self-pay | Admitting: Cardiovascular Disease

## 2020-07-07 ENCOUNTER — Other Ambulatory Visit: Payer: Self-pay

## 2020-07-07 ENCOUNTER — Ambulatory Visit: Payer: 59

## 2020-07-07 VITALS — BP 130/62 | HR 76 | Ht 64.0 in | Wt 233.1 lb

## 2020-07-07 DIAGNOSIS — I5032 Chronic diastolic (congestive) heart failure: Secondary | ICD-10-CM

## 2020-07-07 DIAGNOSIS — I050 Rheumatic mitral stenosis: Secondary | ICD-10-CM | POA: Diagnosis not present

## 2020-07-07 DIAGNOSIS — I4891 Unspecified atrial fibrillation: Secondary | ICD-10-CM | POA: Diagnosis not present

## 2020-07-07 DIAGNOSIS — Z954 Presence of other heart-valve replacement: Secondary | ICD-10-CM

## 2020-07-07 DIAGNOSIS — E785 Hyperlipidemia, unspecified: Secondary | ICD-10-CM

## 2020-07-07 DIAGNOSIS — Z7901 Long term (current) use of anticoagulants: Secondary | ICD-10-CM

## 2020-07-07 MED ORDER — METOPROLOL SUCCINATE ER 50 MG PO TB24: ORAL_TABLET | ORAL | 2 refills | Status: DC

## 2020-07-07 NOTE — Patient Instructions (Addendum)
Medication Instructions:   1) STOP the metoprolol tartrate  2) RESTART metoprolol succinate 50 - take 1 tablet (50 mg) in the AM and take 2 tablets (100 mg) in the PM  If you need a refill on your cardiac medications before your next appointment, please call your pharmacy.    Lab work: No new labs needed   If you have labs (blood work) drawn today and your tests are completely normal, you will receive your results only by: [place] (if you have MyChart) OR A paper copy in the mail If you have any lab test that is abnormal or we need to change your treatment, we will call you to review the results.   Testing/Procedures: No new testing needed   Follow-Up: At [facility], you and your health needs are our priority.  As part of our continuing mission to provide you with exceptional heart care, we have created designated Provider Care Teams.  These Care Teams include your primary Cardiologist (physician) and Advanced Practice Providers (APPs -  Physician Assistants and Nurse Practitioners) who all work together to provide you with the care you need, when you need it.  You will need a follow up appointment in 6 months  Providers on your designated Care Team:   Murray Hodgkins, NP Christell Faith, PA-C Marrianne Mood, PA-C  Any Other Special Instructions Will Be Listed Below (If Applicable).  COVID-19 Vaccine Information can be found at: ShippingScam.co.uk For questions related to vaccine distribution or appointments, please email vaccine@[email or web address] or call [phone].

## 2020-07-08 ENCOUNTER — Telehealth: Payer: Self-pay

## 2020-07-08 LAB — CBC WITH DIFFERENTIAL/PLATELET
Basophils Absolute: 0 10*3/uL (ref 0.0–0.2)
Basos: 1 %
EOS (ABSOLUTE): 0.2 10*3/uL (ref 0.0–0.4)
Eos: 3 %
Hematocrit: 32.2 % — ABNORMAL LOW (ref 34.0–46.6)
Hemoglobin: 10.7 g/dL — ABNORMAL LOW (ref 11.1–15.9)
Immature Grans (Abs): 0 10*3/uL (ref 0.0–0.1)
Immature Granulocytes: 0 %
Lymphocytes Absolute: 1.8 10*3/uL (ref 0.7–3.1)
Lymphs: 28 %
MCH: 28.9 pg (ref 26.6–33.0)
MCHC: 33.2 g/dL (ref 31.5–35.7)
MCV: 87 fL (ref 79–97)
Monocytes Absolute: 0.6 10*3/uL (ref 0.1–0.9)
Monocytes: 9 %
Neutrophils Absolute: 3.7 10*3/uL (ref 1.4–7.0)
Neutrophils: 59 %
Platelets: 350 10*3/uL (ref 150–450)
RBC: 3.7 x10E6/uL — ABNORMAL LOW (ref 3.77–5.28)
RDW: 13.4 % (ref 11.7–15.4)
WBC: 6.3 10*3/uL (ref 3.4–10.8)

## 2020-07-08 LAB — BASIC METABOLIC PANEL
BUN/Creatinine Ratio: 11 (ref 9–23)
BUN: 14 mg/dL (ref 6–24)
CO2: 25 mmol/L (ref 20–29)
Calcium: 9.9 mg/dL (ref 8.7–10.2)
Chloride: 97 mmol/L (ref 96–106)
Creatinine, Ser: 1.28 mg/dL — ABNORMAL HIGH (ref 0.57–1.00)
Glucose: 108 mg/dL — ABNORMAL HIGH (ref 65–99)
Potassium: 3.3 mmol/L — ABNORMAL LOW (ref 3.5–5.2)
Sodium: 138 mmol/L (ref 134–144)
eGFR: 50 mL/min/{1.73_m2} — ABNORMAL LOW (ref >59)

## 2020-07-08 NOTE — Telephone Encounter (Signed)
Spoke with pt about labs her potassium is low take  1 extra dosed potassium Wednesday,Friday and Monday and make follow up appt next with DFK and also called [facility] and add on ferritin, b12 and iron

## 2020-07-08 NOTE — Telephone Encounter (Signed)
Lmom to pt call us back regarding her labs  potassium is low

## 2020-07-09 ENCOUNTER — Other Ambulatory Visit: Payer: Self-pay

## 2020-07-09 ENCOUNTER — Ambulatory Visit (INDEPENDENT_AMBULATORY_CARE_PROVIDER_SITE_OTHER): Payer: 59

## 2020-07-09 ENCOUNTER — Ambulatory Visit: Payer: 59

## 2020-07-09 DIAGNOSIS — I050 Rheumatic mitral stenosis: Secondary | ICD-10-CM | POA: Diagnosis not present

## 2020-07-09 DIAGNOSIS — I4891 Unspecified atrial fibrillation: Secondary | ICD-10-CM

## 2020-07-09 DIAGNOSIS — Z5181 Encounter for therapeutic drug level monitoring: Secondary | ICD-10-CM

## 2020-07-09 LAB — POCT INR: INR: 2.6 (ref 2.0–3.0)

## 2020-07-09 NOTE — Patient Instructions (Signed)
-   continue dosage of warfarin 1/2 tablet every day - Recheck as scheduled in 2 weeks

## 2020-07-10 ENCOUNTER — Ambulatory Visit: Payer: 59

## 2020-07-10 LAB — IRON AND TIBC
Iron Saturation: 21 % (ref 15–55)
Iron: 67 ug/dL (ref 27–159)
Total Iron Binding Capacity: 317 ug/dL (ref 250–450)
UIBC: 250 ug/dL (ref 131–425)

## 2020-07-10 LAB — SPECIMEN STATUS REPORT

## 2020-07-10 LAB — FERRITIN: Ferritin: 366 ng/mL — ABNORMAL HIGH (ref 15–150)

## 2020-07-10 LAB — B12 AND FOLATE PANEL
Folate: >20 ng/mL (ref >3.0)
Vitamin B-12: 715 pg/mL (ref 232–1245)

## 2020-07-14 ENCOUNTER — Ambulatory Visit: Payer: 59

## 2020-07-14 ENCOUNTER — Telehealth: Payer: Self-pay

## 2020-07-14 NOTE — Telephone Encounter (Signed)
Left vm to screen for 07/15/20 appointment-Toni

## 2020-07-15 ENCOUNTER — Ambulatory Visit (INDEPENDENT_AMBULATORY_CARE_PROVIDER_SITE_OTHER): Payer: 59 | Admitting: Internal Medicine

## 2020-07-15 ENCOUNTER — Telehealth: Payer: Self-pay

## 2020-07-15 ENCOUNTER — Other Ambulatory Visit: Payer: Self-pay

## 2020-07-15 VITALS — BP 124/64 | HR 80 | Temp 97.3°F | Resp 16 | Ht 64.0 in | Wt 232.4 lb

## 2020-07-15 DIAGNOSIS — K219 Gastro-esophageal reflux disease without esophagitis: Secondary | ICD-10-CM | POA: Diagnosis not present

## 2020-07-15 DIAGNOSIS — D649 Anemia, unspecified: Secondary | ICD-10-CM | POA: Diagnosis not present

## 2020-07-15 DIAGNOSIS — I10 Essential (primary) hypertension: Secondary | ICD-10-CM

## 2020-07-15 DIAGNOSIS — R5383 Other fatigue: Secondary | ICD-10-CM | POA: Diagnosis not present

## 2020-07-15 DIAGNOSIS — Z954 Presence of other heart-valve replacement: Secondary | ICD-10-CM

## 2020-07-15 MED ORDER — OMEPRAZOLE 40 MG PO CPDR: 40.0000 mg | DELAYED_RELEASE_CAPSULE | Freq: Every day | ORAL | 3 refills | Status: DC

## 2020-07-15 NOTE — Telephone Encounter (Signed)
Received disability paperwork via fax. Gave to [name]

## 2020-07-15 NOTE — Progress Notes (Signed)
[facility] [place], [place] [address]  Internal MEDICINE  Office Visit Note  Patient Name: Leslie [name]  [id number]  [id number]  Date of Service: 07/15/2020  Chief Complaint  Patient presents with   Follow-up    Review labs, discuss FMLA   Quality Metric Gaps    Pneumovax, shingrix    HPI Patient is here in the room with her husband to discuss follow-up on her labs. His CBC continues to show low hemoglobin with elevated ferritin which can be acute phase reactant due to her surgery. Patient also has chronic kidney disease which is improving after the surgery she continues to be on high dose of diuretics which has not been addressed so far potassium is also low 3.3 Patient has history of paroxysmal atrial fibrillation mitral valve stenosis status post mechanical MVR PAF status post Maze on chronic anticoagulation. She continues to be weak and tired some shortness of breath her metoprolol was increased to by the cardiologist recently and she thinks her heart rate has improved along with blood pressure   Current Medication: Outpatient Encounter Medications as of 07/15/2020  Medication Sig   acetaminophen (TYLENOL) 500 MG tablet Take 1,000 mg by mouth every 6 (six) hours as needed for moderate pain.   albuterol (VENTOLIN HFA) 108 (90 Base) MCG/ACT inhaler Inhale 2 puffs into the lungs every 6 (six) hours as needed for wheezing or shortness of breath.   ALPRAZolam (XANAX) 0.5 MG tablet Take 0.5 tablets (0.25 mg total) by mouth at bedtime as needed for anxiety.   amLODipine (NORVASC) 10 MG tablet Take 1 tablet (10 mg total) by mouth daily.   aspirin EC 81 MG tablet Take 1 tablet (81 mg total) by mouth daily. Swallow whole.   atorvastatin (LIPITOR) 80 MG tablet TAKE 1 TABLET BY MOUTH EVERY DAY AT 6 PM   calcium carbonate (TUMS - DOSED IN MG ELEMENTAL CALCIUM) 500 MG chewable tablet Chew 500 mg by mouth daily as needed for indigestion or heartburn.   ferrous  [id number] C-folic acid (TRINSICON / FOLTRIN) capsule Take 1 capsule by mouth 2 (two) times daily after a meal.   fluticasone (FLONASE) 50 MCG/ACT nasal spray Place 1 spray into both nostrils daily as needed for rhinitis.   metoprolol succinate (TOPROL-XL) 50 MG 24 hr tablet Take 1 tablet (50 mg) by mouth in the morning & take 2 tablets (100 mg) by mouth in the evening. Take with or immediately following a meal.   omeprazole (PRILOSEC) 40 MG capsule Take 1 capsule (40 mg total) by mouth daily.   potassium chloride SA (KLOR-CON) 20 MEQ tablet Take 1 tablet (20 mEq total) by mouth 2 (two) times daily.   spironolactone (ALDACTONE) 25 MG tablet Take 0.5 tablets (12.5 mg total) by mouth daily.   torsemide (DEMADEX) 20 MG tablet Take 2 tablets (40 mg total) by mouth 2 (two) times daily.   warfarin (COUMADIN) 2 MG tablet Take 1 tablet (2 mg total) by mouth daily at 4 PM. As instructed by Coumadin clinic for lifetime   [DISCONTINUED] omeprazole (PRILOSEC) 40 MG capsule TAKE 1 CAPSULE(40 MG) BY MOUTH DAILY   No facility-administered encounter medications on file as of 07/15/2020.    Surgical History: Past Surgical History:  Procedure Laterality Date   ABDOMINAL HYSTERECTOMY     total   ABDOMINAL HYSTERECTOMY     BREAST BIOPSY Bilateral 2012   BREAST BIOPSY Right 08-07-12   fibroadenomatous changes and columnar cells   BREAST BIOPSY  02/03/2015   stereo byrnett   BUBBLE STUDY  04/01/2020   Procedure: BUBBLE STUDY;  Surgeon: [name], MD;  Location: [place];  Service: Cardiovascular;;   CARDIAC CATHETERIZATION     CHOLECYSTECTOMY N/A 02/27/2016   Procedure: LAPAROSCOPIC CHOLECYSTECTOMY;  Surgeon: Christene Lye, MD;  Location: [facility] ORS;  Service: General;  Laterality: N/A;   CLIPPING OF ATRIAL APPENDAGE  04/29/2020   Procedure: CLIPPING OF ATRIAL APPENDAGE USING ATRICURE  PRO2 CLIP SIZE 45MM;  Surgeon: Rexene Alberts, MD;  Location: [facility] OR;  Service: Open Heart Surgery;;    COLONOSCOPY WITH PROPOFOL N/A 09/26/2018   Procedure: COLONOSCOPY WITH PROPOFOL;  Surgeon: Lucilla Lame, MD;  Location: [facility] ENDOSCOPY;  Service: Endoscopy;  Laterality: N/A;   CYSTOSCOPY W/ RETROGRADES Right 08/18/2018   Procedure: CYSTOSCOPY WITH RETROGRADE PYELOGRAM;  Surgeon: Billey Co, MD;  Location: [facility];  Service: Urology;  Laterality: Right;   CYSTOSCOPY/URETEROSCOPY/HOLMIUM LASER/STENT PLACEMENT Right 08/18/2018   Procedure: CYSTOSCOPY/URETEROSCOPY/STENT PLACEMENT;  Surgeon: Billey Co, MD;  Location: [facility];  Service: Urology;  Laterality: Right;   DIAGNOSTIC LAPAROSCOPY     ESOPHAGOGASTRODUODENOSCOPY (EGD) WITH PROPOFOL N/A 09/26/2018   Procedure: ESOPHAGOGASTRODUODENOSCOPY (EGD) WITH PROPOFOL;  Surgeon: Lucilla Lame, MD;  Location: [facility] ENDOSCOPY;  Service: Endoscopy;  Laterality: N/A;   GIVENS CAPSULE STUDY N/A 11/03/2018   Procedure: GIVENS CAPSULE STUDY;  Surgeon: Lucilla Lame, MD;  Location: [facility] ENDOSCOPY;  Service: Endoscopy;  Laterality: N/A;   JOINT REPLACEMENT Left    knee   KNEE ARTHROPLASTY Right 04/09/2019   Procedure: COMPUTER ASSISTED TOTAL KNEE ARTHROPLASTY;  Surgeon: Dereck Leep, MD;  Location: [facility];  Service: Orthopedics;  Laterality: Right;   KNEE CLOSED REDUCTION Left 04/15/2015   Procedure: CLOSED MANIPULATION KNEE;  Surgeon: Thornton Park, MD;  Location: [facility];  Service: Orthopedics;  Laterality: Left;   KNEE SURGERY     MAZE N/A 04/29/2020   Procedure: MAZE;  Surgeon: Rexene Alberts, MD;  Location: [place];  Service: Open Heart Surgery;  Laterality: N/A;   MITRAL VALVE REPLACEMENT N/A 04/29/2020   Procedure: MITRAL VALVE (MV) REPLACEMENT USING ON-X VALVE SIZE 27/29MM;  Surgeon: Rexene Alberts, MD;  Location: [place];  Service: Open Heart Surgery;  Laterality: N/A;   MULTIPLE EXTRACTIONS WITH ALVEOLOPLASTY N/A 02/28/2020   Procedure: MULTIPLE EXTRACTION WITH ALVEOLOPLASTY;  Surgeon: [name], DMD;  Location: [place];  Service:  Dentistry;  Laterality: N/A;   RIGHT/LEFT HEART CATH AND CORONARY ANGIOGRAPHY Bilateral 09/06/2019   Procedure: RIGHT/LEFT HEART CATH AND CORONARY ANGIOGRAPHY;  Surgeon: Minna Merritts, MD;  Location: [place] [facility];  Service: Cardiovascular;  Laterality: Bilateral;   TEE WITHOUT CARDIOVERSION N/A 09/13/2017   Procedure: TRANSESOPHAGEAL ECHOCARDIOGRAM (TEE);  Surgeon: Nelva Bush, MD;  Location: [facility];  Service: Cardiovascular;  Laterality: N/A;   TEE WITHOUT CARDIOVERSION N/A 04/01/2020   Procedure: TRANSESOPHAGEAL ECHOCARDIOGRAM (TEE);  Surgeon: [name], MD;  Location: [facility] ENDOSCOPY;  Service: Cardiovascular;  Laterality: N/A;   TEE WITHOUT CARDIOVERSION N/A 04/29/2020   Procedure: TRANSESOPHAGEAL ECHOCARDIOGRAM (TEE);  Surgeon: Rexene Alberts, MD;  Location: [place];  Service: Open Heart Surgery;  Laterality: N/A;   TOTAL KNEE ARTHROPLASTY Left 12/25/2014   Procedure: TOTAL KNEE ARTHROPLASTY;  Surgeon: Thornton Park, MD;  Location: [facility];  Service: Orthopedics;  Laterality: Left;   TUBAL LIGATION      Medical History: Past Medical History:  Diagnosis Date   (HFpEF) heart failure with preserved ejection fraction ([place])  a. 08/2017 Echo: EF 55-60%.  Grade 2 diastolic dysfunction.  Moderate mitral stenosis.   Allergy    Anemia    Anxiety    BRCA negative 03/22/2013   Bronchitis 02/19/2016   ON LEVAQUIN PO   Chest tightness    CHF (congestive heart failure) (HCC)    Cigarette smoker 09/11/2017   8-10 day   Dyspnea    Dysrhythmia    GERD (gastroesophageal reflux disease)    Heart murmur    Per pt. dx by Dr. Roxy Manns via stethescope   History of kidney stones    Hyperlipidemia    Hypertension    Interstitial lung disease ([place])    a. CT 2013 b. 02/2018 CXR noted recurrent intersistial changes ILD vs chronic bronchitis   Moderate mitral stenosis    a.  08/2017 TEE: EF 60 to 65%.  Moderate mitral stenosis.  Mean gradient 14 mmHg.  Valve area 2.59 cm by  planimetry, 2.72 cm by pressure half-time.   Persistent atrial fibrillation ([place])    a. 08/2017 s/p TEE/DCCV; b. CHA2DS2VASc = 2-->warfarin.   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 04/29/2020   27/29 mm Onyx-Valve    Family History: Family History  Problem Relation Age of Onset   Cancer Mother 14       breast   Hypertension Mother    Cancer Maternal Aunt        breast   Cancer Maternal Grandmother        breast   Osteoarthritis Father    Hypertension Father     Social History   Socioeconomic History   Marital status: Married    Spouse name: Not on file   Number of children: Not on file   Years of education: Not on file   Highest education level: Not on file  Occupational History   Not on file  Tobacco Use   Smoking status: Former    Packs/day: 0.50    Years: 20.00    Pack years: 10.00    Types: Cigarettes    Quit date: 01/2017    Years since quitting: 3.4   Smokeless tobacco: Never  Vaping Use   Vaping Use: Never used  Substance and Sexual Activity   Alcohol use: Yes    Comment: rarely   Drug use: No   Sexual activity: Not on file  Other Topics Concern   Not on file  Social History Narrative   Not on file   Social Determinants of Health   Financial Resource Strain: Low Risk    Difficulty of Paying Living Expenses: Not very hard  Food Insecurity: No Food Insecurity   Worried About Charity fundraiser in the Last Year: Never true   Ran Out of Food in the Last Year: Never true  Transportation Needs: No Transportation Needs   Lack of Transportation (Medical): No   Lack of Transportation (Non-Medical): No  Physical Activity: Not on file  Stress: Not on file  Social Connections: Not on file  Intimate Partner Violence: Not on file       Review of Systems  Constitutional:  Negative for chills, fatigue and unexpected weight change.  HENT:  Positive for postnasal drip. Negative for congestion, rhinorrhea, sneezing and sore throat.   Eyes:  Negative for redness.  Respiratory:  Negative  for cough, chest tightness and shortness of breath.   Cardiovascular:  Negative for chest pain and palpitations.  Gastrointestinal:  Negative for abdominal pain, constipation, diarrhea, nausea and vomiting.  Genitourinary:  Negative for dysuria and frequency.  Musculoskeletal:  Negative for arthralgias, back pain, joint swelling and neck pain.  Skin:  Negative for rash.  Neurological: Negative.  Negative for tremors and numbness.  Hematological:  Negative for adenopathy. Does not bruise/bleed easily.  Psychiatric/Behavioral:  Negative for behavioral problems (Depression), sleep disturbance and suicidal ideas. The patient is not nervous/anxious.    Vital Signs: BP 124/64   Pulse 80   Temp (!) 97.3 F (36.3 C)   Resp 16   Ht _0  (1.626 m)   Wt 232 lb 6.4 oz (105.4 kg)   SpO2 99%   BMI 39.89 kg/m    Physical Exam Constitutional:      General: She is not in acute distress.    Appearance: She is well-developed. She is not diaphoretic.  HENT:     Head: Normocephalic and atraumatic.     Mouth/Throat:     Pharynx: No oropharyngeal exudate.  Eyes:     Pupils: Pupils are equal, round, and reactive to light.  Neck:     Thyroid: No thyromegaly.     Vascular: No JVD.     Trachea: No tracheal deviation.  Cardiovascular:     Rate and Rhythm: Normal rate and regular rhythm.     Heart sounds: Normal heart sounds. No murmur heard.   No friction rub. No gallop.  Pulmonary:     Effort: Pulmonary effort is normal. No respiratory distress.     Breath sounds: No wheezing or rales.  Chest:     Chest wall: No tenderness.  Abdominal:     General: Bowel sounds are normal.     Palpations: Abdomen is soft.  Musculoskeletal:         General: Normal range of motion.     Cervical back: Normal range of motion and neck supple.  Lymphadenopathy:     Cervical: No cervical adenopathy.  Skin:    General: Skin is warm and dry.  Neurological:     Mental Status: She is alert and oriented to person, place, and time.     Cranial Nerves: No cranial nerve deficit.  Psychiatric:        Behavior: Behavior normal.        Thought Content: Thought content normal.        Judgment: Judgment normal.       Assessment/Plan: 1. Anemia, unspecified type Patient has been constantly anemic recent hemoglobin is below 10.  Her iron studies ferritin is around 399 she does take iron supplement will refer her to hematology for further work-up if needed, can be acute phase reactant as well - Ambulatory referral to Hematology / Oncology  2. Benign hypertension Dose of metoprolol was increased recently by cardiology blood pressure is better heart rate is also improved- Basic Metabolic Panel (BMET), patient also had low potassium she is on Demadex 80 mg she takes 40 in the morning and 40 in the evening will speak with cardiology maybe this dose can be decreased to get  3. GERD without esophagitis Patient is on anticoagulation and can be high risk for bleeding will start on alprazolam 1 mg once a day   4. Other fatigue Again addressed fatigue patient is getting cardiac rehab after her surgery might be a good idea to reduce her dose of Demadex adjust  potassium level she is on supplement we will repeat her basic metabolic panel she is also anemic so multiple contributing factor to her fatigue patient is not working at the moment she will continue to take time off until seen by hematology  5. S/P mitral valve replacement with Onyx bileaflet mechanical valve Monitored by cardiology  General Counseling: Amely verbalizes understanding of the findings of todays visit and agrees with plan of treatment. I have discussed any further diagnostic evaluation  that may be needed or ordered today. We also reviewed her medications today. she has been encouraged to call the office with any questions or concerns that should arise related to todays visit.    Orders Placed This Encounter  Procedures   Basic Metabolic Panel (BMET)   Ambulatory referral to Hematology / Oncology    Meds ordered this encounter  Medications   omeprazole (PRILOSEC) 40 MG capsule    Sig: Take 1 capsule (40 mg total) by mouth daily.    Dispense:  90 capsule    Refill:  3    Total time spent:30 Minutes Time spent includes review of chart, medications, test results, and follow up plan with the patient.   [place] Controlled Substance Database was reviewed by me.   Dr Lavera Guise Internal medicine

## 2020-07-16 ENCOUNTER — Ambulatory Visit: Payer: 59

## 2020-07-17 ENCOUNTER — Ambulatory Visit: Payer: 59

## 2020-07-17 ENCOUNTER — Other Ambulatory Visit: Payer: Self-pay | Admitting: Internal Medicine

## 2020-07-17 ENCOUNTER — Telehealth: Payer: Self-pay

## 2020-07-17 NOTE — Telephone Encounter (Signed)
Plans to start 7/6 - fell and hurt knee

## 2020-07-23 ENCOUNTER — Encounter: Payer: Self-pay | Admitting: Internal Medicine

## 2020-07-23 ENCOUNTER — Other Ambulatory Visit: Payer: Self-pay

## 2020-07-23 ENCOUNTER — Inpatient Hospital Stay: Payer: 59

## 2020-07-23 ENCOUNTER — Ambulatory Visit: Payer: 59

## 2020-07-23 ENCOUNTER — Inpatient Hospital Stay: Payer: 59 | Attending: Internal Medicine | Admitting: Internal Medicine

## 2020-07-23 VITALS — BP 142/79 | HR 77 | Temp 98.1°F | Resp 18 | Wt 233.2 lb

## 2020-07-23 DIAGNOSIS — Z87891 Personal history of nicotine dependence: Secondary | ICD-10-CM | POA: Diagnosis not present

## 2020-07-23 DIAGNOSIS — Z952 Presence of prosthetic heart valve: Secondary | ICD-10-CM | POA: Diagnosis not present

## 2020-07-23 DIAGNOSIS — N183 Chronic kidney disease, stage 3 unspecified: Secondary | ICD-10-CM | POA: Diagnosis not present

## 2020-07-23 DIAGNOSIS — R5383 Other fatigue: Secondary | ICD-10-CM | POA: Diagnosis not present

## 2020-07-23 DIAGNOSIS — D631 Anemia in chronic kidney disease: Secondary | ICD-10-CM | POA: Diagnosis not present

## 2020-07-23 DIAGNOSIS — D649 Anemia, unspecified: Secondary | ICD-10-CM

## 2020-07-23 DIAGNOSIS — I130 Hypertensive heart and chronic kidney disease with heart failure and stage 1 through stage 4 chronic kidney disease, or unspecified chronic kidney disease: Secondary | ICD-10-CM | POA: Diagnosis not present

## 2020-07-23 LAB — CBC WITH DIFFERENTIAL/PLATELET
Abs Immature Granulocytes: 0.02 10*3/uL (ref 0.00–0.07)
Basophils Absolute: 0.1 10*3/uL (ref 0.0–0.1)
Basophils Relative: 1 %
Eosinophils Absolute: 0.1 10*3/uL (ref 0.0–0.5)
Eosinophils Relative: 2 %
HCT: 33.1 % — ABNORMAL LOW (ref 36.0–46.0)
Hemoglobin: 10.9 g/dL — ABNORMAL LOW (ref 12.0–15.0)
Immature Granulocytes: 0 %
Lymphocytes Relative: 27 %
Lymphs Abs: 2.1 10*3/uL (ref 0.7–4.0)
MCH: 29.5 pg (ref 26.0–34.0)
MCHC: 32.9 g/dL (ref 30.0–36.0)
MCV: 89.5 fL (ref 80.0–100.0)
Monocytes Absolute: 0.8 10*3/uL (ref 0.1–1.0)
Monocytes Relative: 10 %
Neutro Abs: 4.6 10*3/uL (ref 1.7–7.7)
Neutrophils Relative %: 60 %
Platelets: 323 10*3/uL (ref 150–400)
RBC: 3.7 MIL/uL — ABNORMAL LOW (ref 3.87–5.11)
RDW: 13 % (ref 11.5–15.5)
WBC: 7.7 10*3/uL (ref 4.0–10.5)
nRBC: 0 % (ref 0.0–0.2)

## 2020-07-23 LAB — COMPREHENSIVE METABOLIC PANEL
ALT: 20 U/L (ref 0–44)
AST: 24 U/L (ref 15–41)
Albumin: 4.3 g/dL (ref 3.5–5.0)
Alkaline Phosphatase: 130 U/L — ABNORMAL HIGH (ref 38–126)
Anion gap: 8 (ref 5–15)
BUN: 20 mg/dL (ref 6–20)
CO2: 31 mmol/L (ref 22–32)
Calcium: 9 mg/dL (ref 8.9–10.3)
Chloride: 98 mmol/L (ref 98–111)
Creatinine, Ser: 1.54 mg/dL — ABNORMAL HIGH (ref 0.44–1.00)
GFR, Estimated: 40 mL/min — ABNORMAL LOW (ref >60)
Glucose, Bld: 105 mg/dL — ABNORMAL HIGH (ref 70–99)
Potassium: 3.3 mmol/L — ABNORMAL LOW (ref 3.5–5.1)
Sodium: 137 mmol/L (ref 135–145)
Total Bilirubin: 0.6 mg/dL (ref 0.3–1.2)
Total Protein: 8.6 g/dL — ABNORMAL HIGH (ref 6.5–8.1)

## 2020-07-23 LAB — LACTATE DEHYDROGENASE: LDH: 269 U/L — ABNORMAL HIGH (ref 98–192)

## 2020-07-23 NOTE — Assessment & Plan Note (Addendum)
#   Anemia- Hb ~10-11. Iron sat- 20%;/iron deficiency/CKD.  For now recommend p.o. iron.  If intolerance would recommend IV iron infusion. Discussed the potential acute infusion reactions with IV iron; which are quite rare.   # Etiology: likely sec to CKD-III/.   Thank you, Dr.Khan for allowing me to participate in the care of your pleasant patient. Please do not hesitate to contact me with questions or concerns in the interim.  # DISPOSITION: # labs today # follow up in 3 months- MD; labs- cbc/bmp; iron studies ferritin- Possible Venofer- Dr.B  Addendum: Repeat hemoglobin is 10.9.  No immediate reason for any iron infusions.  Recommend oral iron 65 mg once a day.  Follow-up as planned patient informed

## 2020-07-23 NOTE — Progress Notes (Signed)
Surgery site upper stomach bleeding since April 13. She spoke to her provider about the site bleeding.Marland KitchenMarland Kitchen

## 2020-07-23 NOTE — Patient Instructions (Signed)
#  Please take iron sulfate 65 mg once a day [over-the-counter].

## 2020-07-23 NOTE — Progress Notes (Signed)
[facility] NOTE  Patient Care Team: Lavera Guise, MD as PCP - General (Internal Medicine) Rockey Situ Kathlene November, MD as PCP - Cardiology (Cardiology) Christene Lye, MD (General Surgery)  CHIEF COMPLAINTS/PURPOSE OF CONSULTATION: ANEMIA   HEMATOLOGY HISTORY:  # ANEMIA MILD Hb ~11/CKD stage III; SEP 2020: EGD/ colonoscopy/capsule-NEG [Dr.Anna];   #CKD stage III; ?  Rheumatic heart disease s/p mitral valve replacement  HISTORY OF PRESENTING ILLNESS:  Leslie [name] 55 y.o.  female has been referred to [place] for further evaluation/work-up for anemia.  Patient complains of mild fatigue.  Otherwise denies any swelling of the legs or shortness of breath or cough.  Blood in stools: None Change in bowel habits- None Blood in urine: None Difficulty swallowing: None Abnormal weight loss: None Iron supplementation: none Prior Blood transfusions: 2 units in April 2022 [MVR] Bariatric surgery: None  Vaginal bleeding: None  Review of Systems  Constitutional:  Positive for malaise/fatigue. Negative for chills, diaphoresis, fever and weight loss.  HENT:  Negative for nosebleeds and sore throat.   Eyes:  Negative for double vision.  Respiratory:  Negative for cough, hemoptysis, sputum production, shortness of breath and wheezing.   Cardiovascular:  Negative for chest pain, palpitations, orthopnea and leg swelling.  Gastrointestinal:  Negative for abdominal pain, blood in stool, constipation, diarrhea, heartburn, melena, nausea and vomiting.  Genitourinary:  Negative for dysuria, frequency and urgency.  Musculoskeletal:  Positive for joint pain. Negative for back pain.  Skin: Negative.  Negative for itching and rash.  Neurological:  Negative for dizziness, tingling, focal weakness, weakness and headaches.  Endo/Heme/Allergies:  Does not bruise/bleed easily.  Psychiatric/Behavioral:  Negative for depression. The patient is not nervous/anxious and does not have  insomnia.    MEDICAL HISTORY:  Past Medical History:  Diagnosis Date  . (HFpEF) heart failure with preserved ejection fraction ([place])    a. 08/2017 Echo: EF 55-60%.  Grade 2 diastolic dysfunction.  Moderate mitral stenosis.  . Allergy   . Anemia   . Anxiety   . BRCA negative 03/22/2013  . Bronchitis 02/19/2016   ON LEVAQUIN PO  . Chest tightness   . CHF (congestive heart failure) ([place])   . Cigarette smoker 09/11/2017   8-10 day  . Dyspnea   . Dysrhythmia   . GERD (gastroesophageal reflux disease)   . Heart murmur    Per pt. dx by Dr. Roxy Manns via [place]  . History of kidney stones   . Hyperlipidemia   . Hypertension   . Interstitial lung disease ([place])    a. CT 2013 b. 02/2018 CXR noted recurrent intersistial changes ILD vs chronic bronchitis  . Moderate mitral stenosis    a.  08/2017 TEE: EF 60 to 65%.  Moderate mitral stenosis.  Mean gradient 14 mmHg.  Valve area 2.59 cm by planimetry, 2.72 cm by pressure half-time.  . Persistent atrial fibrillation ([place])    a. 08/2017 s/p TEE/DCCV; b. CHA2DS2VASc = 2-->warfarin.  . Pneumonia   . S/P Maze operation for atrial fibrillation 04/29/2020   Complete bilateral atrial lesion set using bipolar radiofrequency and cryothermy ablation with clipping of LA appendage  . S/P mitral valve replacement with Onyx bileaflet mechanical valve 04/29/2020   27/29 mm Onyx-Valve    SURGICAL HISTORY: Past Surgical History:  Procedure Laterality Date  . ABDOMINAL HYSTERECTOMY     total  . ABDOMINAL HYSTERECTOMY    . BREAST BIOPSY Bilateral 2012  . BREAST BIOPSY Right 08-07-12   fibroadenomatous changes and  columnar cells  . BREAST BIOPSY  02/03/2015   stereo byrnett  . BUBBLE STUDY  04/01/2020   Procedure: BUBBLE STUDY;  Surgeon: [name], MD;  Location: [facility];  Service: Cardiovascular;;  . CARDIAC CATHETERIZATION    . CHOLECYSTECTOMY N/A 02/27/2016   Procedure: LAPAROSCOPIC CHOLECYSTECTOMY;  Surgeon: Christene Lye, MD;   Location: [facility]S;  Service: General;  Laterality: N/A;  . CLIPPING OF ATRIAL APPENDAGE  04/29/2020   Procedure: CLIPPING OF ATRIAL APPENDAGE USING ATRICURE  PRO2 CLIP SIZE 45MM;  Surgeon: Rexene Alberts, MD;  Location: [facility] OR;  Service: Open Heart Surgery;;  . COLONOSCOPY WITH PROPOFOL N/A 09/26/2018   Procedure: COLONOSCOPY WITH PROPOFOL;  Surgeon: Lucilla Lame, MD;  Location: [facility] ENDOSCOPY;  Service: Endoscopy;  Laterality: N/A;  . CYSTOSCOPY W/ RETROGRADES Right 08/18/2018   Procedure: CYSTOSCOPY WITH RETROGRADE PYELOGRAM;  Surgeon: Billey Co, MD;  Location: [facility] ORS;  Service: Urology;  Laterality: Right;  . CYSTOSCOPY/URETEROSCOPY/HOLMIUM LASER/STENT PLACEMENT Right 08/18/2018   Procedure: CYSTOSCOPY/URETEROSCOPY/STENT PLACEMENT;  Surgeon: Billey Co, MD;  Location: [facility] ORS;  Service: Urology;  Laterality: Right;  . DIAGNOSTIC LAPAROSCOPY    . ESOPHAGOGASTRODUODENOSCOPY (EGD) WITH PROPOFOL N/A 09/26/2018   Procedure: ESOPHAGOGASTRODUODENOSCOPY (EGD) WITH PROPOFOL;  Surgeon: Lucilla Lame, MD;  Location: [facility] ENDOSCOPY;  Service: Endoscopy;  Laterality: N/A;  . GIVENS CAPSULE STUDY N/A 11/03/2018   Procedure: GIVENS CAPSULE STUDY;  Surgeon: Lucilla Lame, MD;  Location: [facility] ENDOSCOPY;  Service: Endoscopy;  Laterality: N/A;  . JOINT REPLACEMENT Left    knee  . KNEE ARTHROPLASTY Right 04/09/2019   Procedure: COMPUTER ASSISTED TOTAL KNEE ARTHROPLASTY;  Surgeon: Dereck Leep, MD;  Location: [facility];  Service: Orthopedics;  Laterality: Right;  . KNEE CLOSED REDUCTION Left 04/15/2015   Procedure: CLOSED MANIPULATION KNEE;  Surgeon: Thornton Park, MD;  Location: [facility];  Service: Orthopedics;  Laterality: Left;  . KNEE SURGERY    . MAZE N/A 04/29/2020   Procedure: MAZE;  Surgeon: Rexene Alberts, MD;  Location: [place];  Service: Open Heart Surgery;  Laterality: N/A;  . MITRAL VALVE REPLACEMENT N/A 04/29/2020   Procedure: MITRAL VALVE (MV) REPLACEMENT USING ON-X VALVE SIZE 27/29MM;   Surgeon: Rexene Alberts, MD;  Location: [place];  Service: Open Heart Surgery;  Laterality: N/A;  . MULTIPLE EXTRACTIONS WITH ALVEOLOPLASTY N/A 02/28/2020   Procedure: MULTIPLE EXTRACTION WITH ALVEOLOPLASTY;  Surgeon: [name], DMD;  Location: [place];  Service: Dentistry;  Laterality: N/A;  . RIGHT/LEFT HEART CATH AND CORONARY ANGIOGRAPHY Bilateral 09/06/2019   Procedure: RIGHT/LEFT HEART CATH AND CORONARY ANGIOGRAPHY;  Surgeon: Minna Merritts, MD;  Location: [place] CV LAB;  Service: Cardiovascular;  Laterality: Bilateral;  . TEE WITHOUT CARDIOVERSION N/A 09/13/2017   Procedure: TRANSESOPHAGEAL ECHOCARDIOGRAM (TEE);  Surgeon: Nelva Bush, MD;  Location: [facility];  Service: Cardiovascular;  Laterality: N/A;  . TEE WITHOUT CARDIOVERSION N/A 04/01/2020   Procedure: TRANSESOPHAGEAL ECHOCARDIOGRAM (TEE);  Surgeon: [name], MD;  Location: [facility] ENDOSCOPY;  Service: Cardiovascular;  Laterality: N/A;  . TEE WITHOUT CARDIOVERSION N/A 04/29/2020   Procedure: TRANSESOPHAGEAL ECHOCARDIOGRAM (TEE);  Surgeon: Rexene Alberts, MD;  Location: [place];  Service: Open Heart Surgery;  Laterality: N/A;  . TOTAL KNEE ARTHROPLASTY Left 12/25/2014   Procedure: TOTAL KNEE ARTHROPLASTY;  Surgeon: Thornton Park, MD;  Location: [facility] ORS;  Service: Orthopedics;  Laterality: Left;  . TUBAL LIGATION      SOCIAL HISTORY: Social History   Socioeconomic History  . Marital status: Married  Spouse name: Not on file  . Number of children: Not on file  . Years of education: Not on file  . Highest education level: Not on file  Occupational History  . Not on file  Tobacco Use  . Smoking status: Former    Packs/day: 0.50    Years: 20.00    Pack years: 10.00    Types: Cigarettes    Quit date: 01/2017    Years since quitting: 3.5  . Smokeless tobacco: Never  Vaping Use  . Vaping Use: Never used  Substance and Sexual Activity  . Alcohol use: Yes    Comment: rarely  . Drug use: No  . Sexual  activity: Not on file  Other Topics Concern  . Not on file  Social History Narrative  . Not on file   Social Determinants of Health   Financial Resource Strain: Low Risk   . Difficulty of Paying Living Expenses: Not very hard  Food Insecurity: No Food Insecurity  . Worried About Charity fundraiser in the Last Year: Never true  . Ran Out of Food in the Last Year: Never true  Transportation Needs: No Transportation Needs  . Lack of Transportation (Medical): No  . Lack of Transportation (Non-Medical): No  Physical Activity: Not on file  Stress: Not on file  Social Connections: Not on file  Intimate Partner Violence: Not on file    FAMILY HISTORY: Family History  Problem Relation Age of Onset  . Cancer Mother 62       breast  . Hypertension Mother   . Cancer Maternal Aunt        breast  . Cancer Maternal Grandmother        breast  . Osteoarthritis Father   . Hypertension Father     ALLERGIES:  is allergic to morphine, oxycodone hcl, and dilaudid [hydromorphone hcl].  MEDICATIONS:  Current Outpatient Medications  Medication Sig Dispense Refill  . acetaminophen (TYLENOL) 500 MG tablet Take 1,000 mg by mouth every 6 (six) hours as needed for moderate pain.    Marland Kitchen albuterol (VENTOLIN HFA) 108 (90 Base) MCG/ACT inhaler Inhale 2 puffs into the lungs every 6 (six) hours as needed for wheezing or shortness of breath. 8 g 2  . ALPRAZolam (XANAX) 0.5 MG tablet Take 0.5 tablets (0.25 mg total) by mouth at bedtime as needed for anxiety. 30 tablet 1  . amLODipine (NORVASC) 10 MG tablet Take 1 tablet (10 mg total) by mouth daily. 90 tablet 1  . aspirin EC 81 MG tablet Take 1 tablet (81 mg total) by mouth daily. Swallow whole. 100 tablet 2  . atorvastatin (LIPITOR) 80 MG tablet TAKE 1 TABLET BY MOUTH EVERY DAY AT 6 PM 90 tablet 1  . calcium carbonate (TUMS - DOSED IN MG ELEMENTAL CALCIUM) 500 MG chewable tablet Chew 500 mg by mouth daily as needed for indigestion or heartburn.    .  ferrous [id number] C-folic acid (TRINSICON / FOLTRIN) capsule Take 1 capsule by mouth 2 (two) times daily after a meal. 60 capsule 2  . fluticasone (FLONASE) 50 MCG/ACT nasal spray Place 1 spray into both nostrils daily as needed for rhinitis.    . metoprolol succinate (TOPROL-XL) 50 MG 24 hr tablet Take 1 tablet (50 mg) by mouth in the morning & take 2 tablets (100 mg) by mouth in the evening. Take with or immediately following a meal. 270 tablet 2  . omeprazole (PRILOSEC) 40 MG capsule Take 1 capsule (40 mg total) by  mouth daily. 90 capsule 3  . potassium chloride SA (KLOR-CON) 20 MEQ tablet Take 1 tablet (20 mEq total) by mouth 2 (two) times daily. 60 tablet 5  . spironolactone (ALDACTONE) 25 MG tablet Take 0.5 tablets (12.5 mg total) by mouth daily. 90 tablet 1  . torsemide (DEMADEX) 20 MG tablet Take 2 tablets (40 mg total) by mouth 2 (two) times daily. 180 tablet 1  . warfarin (COUMADIN) 2 MG tablet TAKE 1 TABLET BY MOUTH DAILY AT 4PM AS INSTRUCTED BY COUMADIN CLINIC FOR LIFETIME 90 tablet 1   No current facility-administered medications for this visit.      PHYSICAL EXAMINATION:   Vitals:   07/23/20 1414  BP: (!) 142/79  Pulse: 77  Resp: 18  Temp: 98.1 F (36.7 C)  SpO2: 98%   Filed Weights   07/23/20 1414  Weight: 233 lb 3.2 oz (105.8 kg)    Physical Exam Vitals and nursing note reviewed.  Constitutional:      Comments: Ambulating: independently  with family  HENT:     Head: Normocephalic and atraumatic.     Mouth/Throat:     Pharynx: Oropharynx is clear.  Eyes:     Extraocular Movements: Extraocular movements intact.     Pupils: Pupils are equal, round, and reactive to light.  Cardiovascular:     Rate and Rhythm: Normal rate and regular rhythm.  Pulmonary:     Comments: Decreased breath sounds bilaterally.  Abdominal:     Palpations: Abdomen is soft.  Musculoskeletal:        General: Normal range of motion.     Cervical back: Normal range of  motion.  Skin:    General: Skin is warm.  Neurological:     General: No focal deficit present.     Mental Status: She is alert and oriented to person, place, and time.  Psychiatric:        Behavior: Behavior normal.        Judgment: Judgment normal.    LABORATORY DATA:  I have reviewed the data as listed Lab Results  Component Value Date   WBC 7.7 07/23/2020   HGB 10.9 (L) 07/23/2020   HCT 33.1 (L) 07/23/2020   MCV 89.5 07/23/2020   PLT 323 07/23/2020   Recent Labs    09/05/19 1638 09/05/19 1638 12/31/19 1102 02/28/20 0848 03/07/20 1039 03/26/20 1129 05/05/20 0350 05/05/20 1652 05/08/20 0250 05/09/20 0522 05/11/20 0404 05/11/20 1217 05/14/20 1611 06/02/20 1149 07/07/20 0808 07/23/20 1503  NA 137   < > 141   < > 140   < > 127*   < > 131*   < > 134* 135   < > 140 138 137  K 3.4*  --  3.7   < > 4.0   < > 4.0   < > 3.7   < > 2.8* 2.8*   < > 3.9 3.3* 3.3*  CL 97*  --  101   < > 98   < > 89*   < > 95*   < > 89* 91*   < > 95* 97 98  CO2 27  --  26   < > 24   < > 28   < > 26   < > 33* 31   < > '25 25 31  ' GLUCOSE 115*   < > 98   < > 117*   < > 200*   < > 134*   < > 109* 139*   < >  90 108* 105*  BUN 23*   < > 16   < > 14   < > 59*   < > 58*   < > 39* 35*   < > '12 14 20  ' CREATININE 1.71*  --  1.18*   < > 1.26*   < > 2.40*   < > 2.24*   < > 1.80* 1.62*   < > 1.32* 1.28* 1.54*  CALCIUM 9.3  --  9.1   < > 9.2   < > 8.6*   < > 8.6*   < > 8.9 8.9   < > 9.5 9.9 9.0  GFRNONAA 34*  --  53*   < > 49*   < > 24*   < > 26*   < > 33* 38*  --   --   --  40*  GFRAA 39*  --  61  --  56*  --   --   --   --   --   --   --   --   --   --   --   PROT  --   --   --   --  7.8   < > 6.3*  --   --   --   --   --   --  8.5  --  8.6*  ALBUMIN  --   --   --   --  4.4   < > 2.6*   < > 2.8*  --   --   --   --  4.5  --  4.3  AST  --   --   --   --  24   < > 36  --   --   --   --   --   --  24  --  24  ALT  --   --   --   --  23   < > 33  --   --   --   --   --   --  18  --  20  ALKPHOS  --   --   --   --   184*   < > 84  --   --   --   --   --   --  199*  --  130*  BILITOT  --   --   --   --  0.3   < > 0.8  --   --   --   --   --   --  0.3  --  0.6   < > = values in this interval not displayed.     No results found.  Normocytic anemia # Anemia- Hb ~10-11. Iron sat- 20%;/iron deficiency/CKD.  For now recommend p.o. iron.  If intolerance would recommend IV iron infusion. Discussed the potential acute infusion reactions with IV iron; which are quite rare.   # Etiology: likely sec to CKD-III/.   Thank you, Dr.Khan for allowing me to participate in the care of your pleasant patient. Please do not hesitate to contact me with questions or concerns in the interim.  # DISPOSITION: # labs today # follow up in 3 months- MD; labs- cbc/bmp; iron studies ferritin- Possible Venofer- Dr.B  Addendum: Repeat hemoglobin is 10.9.  No immediate reason for any iron infusions.  Recommend oral iron 65 mg once a day.  Follow-up as planned patient informed  All questions were answered. The  patient knows to call the clinic with any problems, questions or concerns.   Cammie Sickle, MD 07/29/2020 10:02 PM

## 2020-07-24 ENCOUNTER — Ambulatory Visit: Payer: 59

## 2020-07-28 ENCOUNTER — Telehealth: Payer: Self-pay

## 2020-07-28 ENCOUNTER — Ambulatory Visit: Payer: 59

## 2020-07-28 DIAGNOSIS — Z952 Presence of prosthetic heart valve: Secondary | ICD-10-CM

## 2020-07-28 NOTE — Telephone Encounter (Signed)
Patient's disability paperwork completed by provider and faxed with office visit notes from April 2022- present to [employer]. Copy of Paperwork placed in scan and original handed to patient. Leslie [name]

## 2020-07-28 NOTE — Telephone Encounter (Signed)
Attempted to call patient regarding [facility] and attendance as patient has not showed. Asked for callback.

## 2020-07-29 ENCOUNTER — Other Ambulatory Visit: Payer: Self-pay | Admitting: Internal Medicine

## 2020-07-29 DIAGNOSIS — D649 Anemia, unspecified: Secondary | ICD-10-CM

## 2020-07-29 NOTE — Progress Notes (Signed)
H/T-please inform patient that her hemoglobin is fairly stable.  Recommend oral iron for now.  Follow-up as planned.

## 2020-07-30 ENCOUNTER — Other Ambulatory Visit: Payer: Self-pay

## 2020-07-30 ENCOUNTER — Ambulatory Visit: Payer: 59

## 2020-07-30 ENCOUNTER — Encounter: Payer: Self-pay | Admitting: *Deleted

## 2020-07-30 ENCOUNTER — Ambulatory Visit (INDEPENDENT_AMBULATORY_CARE_PROVIDER_SITE_OTHER): Payer: 59

## 2020-07-30 DIAGNOSIS — Z5181 Encounter for therapeutic drug level monitoring: Secondary | ICD-10-CM | POA: Diagnosis not present

## 2020-07-30 DIAGNOSIS — I4891 Unspecified atrial fibrillation: Secondary | ICD-10-CM | POA: Diagnosis not present

## 2020-07-30 DIAGNOSIS — Z952 Presence of prosthetic heart valve: Secondary | ICD-10-CM

## 2020-07-30 DIAGNOSIS — I050 Rheumatic mitral stenosis: Secondary | ICD-10-CM | POA: Diagnosis not present

## 2020-07-30 LAB — POCT INR: INR: 1.5 — AB (ref 2.0–3.0)

## 2020-07-30 NOTE — Patient Instructions (Addendum)
-   take 1 tablet tonight and tomorrow , then  - continue dosage of warfarin 1/2 tablet every day - Recheck as scheduled in 1 week

## 2020-07-30 NOTE — Progress Notes (Signed)
Cardiac Individual Treatment Plan  Patient Details  Name: Leslie [name] MRN: [id number] Date of Birth: [birth year] Referring Provider:   Flowsheet Row Cardiac Rehab from 06/23/2020 in [facility] Cardiac and Pulmonary Rehab  Referring Provider Gollan       Initial Encounter Date:  Flowsheet Row Cardiac Rehab from 06/23/2020 in [facility] Cardiac and Pulmonary Rehab  Date 06/23/20       Visit Diagnosis: S/P MVR (mitral valve replacement)  Patient's Home Medications on Admission:  Current Outpatient Medications:    acetaminophen (TYLENOL) 500 MG tablet, Take 1,000 mg by mouth every 6 (six) hours as needed for moderate pain., Disp: , Rfl:    albuterol (VENTOLIN HFA) 108 (90 Base) MCG/ACT inhaler, Inhale 2 puffs into the lungs every 6 (six) hours as needed for wheezing or shortness of breath., Disp: 8 g, Rfl: 2   ALPRAZolam (XANAX) 0.5 MG tablet, Take 0.5 tablets (0.25 mg total) by mouth at bedtime as needed for anxiety., Disp: 30 tablet, Rfl: 1   amLODipine (NORVASC) 10 MG tablet, Take 1 tablet (10 mg total) by mouth daily., Disp: 90 tablet, Rfl: 1   aspirin EC 81 MG tablet, Take 1 tablet (81 mg total) by mouth daily. Swallow whole., Disp: 100 tablet, Rfl: 2   atorvastatin (LIPITOR) 80 MG tablet, TAKE 1 TABLET BY MOUTH EVERY DAY AT 6 PM, Disp: 90 tablet, Rfl: 1   calcium carbonate (TUMS - DOSED IN MG ELEMENTAL CALCIUM) 500 MG chewable tablet, Chew 500 mg by mouth daily as needed for indigestion or heartburn., Disp: , Rfl:    ferrous [id number] C-folic acid (TRINSICON / FOLTRIN) capsule, Take 1 capsule by mouth 2 (two) times daily after a meal., Disp: 60 capsule, Rfl: 2   fluticasone (FLONASE) 50 MCG/ACT nasal spray, Place 1 spray into both nostrils daily as needed for rhinitis., Disp: , Rfl:    metoprolol succinate (TOPROL-XL) 50 MG 24 hr tablet, Take 1 tablet (50 mg) by mouth in the morning & take 2 tablets (100 mg) by mouth in the evening. Take with or immediately following a meal., Disp: 270  tablet, Rfl: 2   omeprazole (PRILOSEC) 40 MG capsule, Take 1 capsule (40 mg total) by mouth daily., Disp: 90 capsule, Rfl: 3   potassium chloride SA (KLOR-CON) 20 MEQ tablet, Take 1 tablet (20 mEq total) by mouth 2 (two) times daily., Disp: 60 tablet, Rfl: 5   spironolactone (ALDACTONE) 25 MG tablet, Take 0.5 tablets (12.5 mg total) by mouth daily., Disp: 90 tablet, Rfl: 1   torsemide (DEMADEX) 20 MG tablet, Take 2 tablets (40 mg total) by mouth 2 (two) times daily., Disp: 180 tablet, Rfl: 1   warfarin (COUMADIN) 2 MG tablet, TAKE 1 TABLET BY MOUTH DAILY AT 4PM AS INSTRUCTED BY COUMADIN CLINIC FOR LIFETIME, Disp: 90 tablet, Rfl: 1  Past Medical History: Past Medical History:  Diagnosis Date   (HFpEF) heart failure with preserved ejection fraction ([place])    a. 08/2017 Echo: EF 55-60%.  Grade 2 diastolic dysfunction.  Moderate mitral stenosis.   Allergy    Anemia    Anxiety    BRCA negative 03/22/2013   Bronchitis 02/19/2016   ON LEVAQUIN PO   Chest tightness    CHF (congestive heart failure) (HCC)    Cigarette smoker 09/11/2017   8-10 day   Dyspnea    Dysrhythmia    GERD (gastroesophageal reflux disease)    Heart murmur    Per pt. dx by Dr. Roxy Manns via stethescope   History of  kidney stones    Hyperlipidemia    Hypertension    Interstitial lung disease ([place])    a. CT 2013 b. 02/2018 CXR noted recurrent intersistial changes ILD vs chronic bronchitis   Moderate mitral stenosis    a.  08/2017 TEE: EF 60 to 65%.  Moderate mitral stenosis.  Mean gradient 14 mmHg.  Valve area 2.59 cm by planimetry, 2.72 cm by pressure half-time.   Persistent atrial fibrillation ([place])    a. 08/2017 s/p TEE/DCCV; b. CHA2DS2VASc = 2-->warfarin.   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 04/29/2020    27/29 mm Onyx-Valve    Tobacco Use: Social History   Tobacco Use  Smoking Status Former   Packs/day: 0.50   Years: 20.00   Pack years: 10.00   Types: Cigarettes   Quit date: 01/2017   Years since quitting: 3.5  Smokeless Tobacco Never    Labs: Recent Review Flowsheet Data     Labs for ITP Cardiac and Pulmonary Rehab Latest Ref Rng & Units 05/07/2020 05/07/2020 05/09/2020 05/10/2020 06/06/2020   Cholestrol 100 - 199 mg/dL - - - - -   LDLCALC 0 - 99 mg/dL - - - - -   HDL >39 mg/dL - - - - -   Trlycerides 0 - 149 mg/dL - - - - -   Hemoglobin A1c 4.0 - 5.6 % - - - - 5.8(A)   PHART 7.350 - 7.450 - - - - -   PCO2ART 32.0 - 48.0 mmHg - - - - -   HCO3 20.0 - 28.0 mmol/L - - - - -   TCO2 22 - 32 mmol/L - - - - -   ACIDBASEDEF 0.0 - 2.0 mmol/L - - - - -   O2SAT % 54.5 60.8 52.4 63.8 -        Exercise Target Goals: Exercise Program Goal: Individual exercise prescription set using results from initial 6 min walk test and THRR while considering  patient's activity barriers and safety.   Exercise Prescription Goal: Initial exercise prescription builds to 30-45 minutes a day of aerobic activity, 2-3 days per week.  Home exercise guidelines will be given to patient during program as part of exercise prescription that the participant will acknowledge.   Education: Aerobic Exercise: - Group verbal and visual presentation on the components of exercise prescription. Introduces F.I.T.T principle from ACSM for exercise prescriptions.  Reviews F.I.T.T. principles of aerobic exercise including progression. Written material given at graduation.   Education: Resistance Exercise: - Group verbal and visual presentation on the components of exercise prescription. Introduces F.I.T.T principle from ACSM for exercise prescriptions  Reviews F.I.T.T. principles of resistance exercise including progression. Written material given at graduation.    Education: Exercise & Equipment Safety: - Individual  verbal instruction and demonstration of equipment use and safety with use of the equipment. Flowsheet Row Cardiac Rehab from 06/23/2020 in [facility] Cardiac and Pulmonary Rehab  Date 06/23/20  Educator AS  Instruction Review Code 1- Verbalizes Understanding       Education: Exercise Physiology & General Exercise Guidelines: - Group verbal and written instruction with models to review the exercise physiology of the cardiovascular system and associated critical values. Provides general exercise guidelines with specific guidelines to those with heart or lung disease.    Education: Flexibility, Balance, Mind/Body  Relaxation: - Group verbal and visual presentation with interactive activity on the components of exercise prescription. Introduces F.I.T.T principle from ACSM for exercise prescriptions. Reviews F.I.T.T. principles of flexibility and balance exercise training including progression. Also discusses the mind body connection.  Reviews various relaxation techniques to help reduce and manage stress (i.e. Deep breathing, progressive muscle relaxation, and visualization). Balance handout provided to take home. Written material given at graduation.   Activity Barriers & Risk Stratification:  Activity Barriers & Cardiac Risk Stratification - 06/17/20 0839       Activity Barriers & Cardiac Risk Stratification   Activity Barriers Left Knee Replacement;Right Knee Replacement;Deconditioning;Muscular Weakness;Shortness of Breath;Decreased Ventricular Function    Cardiac Risk Stratification Moderate             6 Minute Walk:  6 Minute Walk     Row Name 06/23/20 1600         6 Minute Walk   Phase Initial     Distance 905 feet     Walk Time 6 minutes     # of Rest Breaks 0     MPH 1.7     METS 2.73     RPE 9     Perceived Dyspnea  1     VO2 Peak 9.5     Symptoms No     Resting HR 86 bpm     Resting BP 106/58     Resting Oxygen Saturation  99 %     Exercise Oxygen Saturation  during  6 min walk 96 %     Max Ex. HR 113 bpm     Max Ex. BP 144/60     2 Minute Post BP 108/58              Oxygen Initial Assessment:   Oxygen Re-Evaluation:   Oxygen Discharge (Final Oxygen Re-Evaluation):   Initial Exercise Prescription:  Initial Exercise Prescription - 06/23/20 1600       Date of Initial Exercise RX and Referring Provider   Date 06/23/20    Referring Provider Gollan      Treadmill   MPH 1.7    Grade 1    Minutes 15    METs 2.5      Recumbant Bike   Level 2    RPM 60    Watts 24    Minutes 15    METs 2.5      NuStep   Level 2    SPM 80    Minutes 15    METs 2.5      Recumbant Elliptical   Level 1    RPM 50    Watts 24    Minutes 15    METs 2.5      Prescription Details   Frequency (times per week) 3    Duration Progress to 30 minutes of continuous aerobic without signs/symptoms of physical distress      Intensity   Ratings of Perceived Exertion 11-15    Perceived Dyspnea 0-4      Resistance Training   Training Prescription Yes    Weight 3 lb    Reps 10-15             Perform Capillary Blood Glucose checks as needed.  Exercise Prescription Changes:   Exercise Prescription Changes     Row Name 06/23/20 1600             Response to Exercise   Blood Pressure (Admit) 106/58  Blood Pressure (Exercise) 144/60       Blood Pressure (Exit) 108/58       Heart Rate (Admit) 86 bpm       Heart Rate (Exercise) 113 bpm       Heart Rate (Exit) 87 bpm       Oxygen Saturation (Admit) 99 %       Oxygen Saturation (Exercise) 96 %       Rating of Perceived Exertion (Exercise) 9       Perceived Dyspnea (Exercise) 1       Symptoms none                Exercise Comments:   Exercise Goals and Review:   Exercise Goals     Row Name 06/23/20 1609             Exercise Goals   Increase Physical Activity Yes       Intervention Provide advice, education, support and counseling about physical activity/exercise  needs.;Develop an individualized exercise prescription for aerobic and resistive training based on initial evaluation findings, risk stratification, comorbidities and participant's personal goals.       Expected Outcomes Short Term: Attend rehab on a regular basis to increase amount of physical activity.;Long Term: Add in home exercise to make exercise part of routine and to increase amount of physical activity.;Long Term: Exercising regularly at least 3-5 days a week.       Increase Strength and Stamina Yes       Intervention Provide advice, education, support and counseling about physical activity/exercise needs.;Develop an individualized exercise prescription for aerobic and resistive training based on initial evaluation findings, risk stratification, comorbidities and participant's personal goals.       Expected Outcomes Short Term: Increase workloads from initial exercise prescription for resistance, speed, and METs.;Short Term: Perform resistance training exercises routinely during rehab and add in resistance training at home;Long Term: Improve cardiorespiratory fitness, muscular endurance and strength as measured by increased METs and functional capacity (6MWT)       Able to understand and use rate of perceived exertion (RPE) scale Yes       Intervention Provide education and explanation on how to use RPE scale       Expected Outcomes Short Term: Able to use RPE daily in rehab to express subjective intensity level;Long Term:  Able to use RPE to guide intensity level when exercising independently       Able to understand and use Dyspnea scale Yes       Intervention Provide education and explanation on how to use Dyspnea scale       Expected Outcomes Short Term: Able to use Dyspnea scale daily in rehab to express subjective sense of shortness of breath during exertion;Long Term: Able to use Dyspnea scale to guide intensity level when exercising independently       Knowledge and understanding of  Target Heart Rate Range (THRR) Yes       Intervention Provide education and explanation of THRR including how the numbers were predicted and where they are located for reference       Expected Outcomes Short Term: Able to state/look up THRR;Short Term: Able to use daily as guideline for intensity in rehab;Long Term: Able to use THRR to govern intensity when exercising independently       Able to check pulse independently Yes       Intervention Provide education and demonstration on how to check pulse in carotid and radial arteries.;Review the  importance of being able to check your own pulse for safety during independent exercise       Expected Outcomes Short Term: Able to explain why pulse checking is important during independent exercise;Long Term: Able to check pulse independently and accurately       Understanding of Exercise Prescription Yes       Intervention Provide education, explanation, and written materials on patient's individual exercise prescription       Expected Outcomes Short Term: Able to explain program exercise prescription;Long Term: Able to explain home exercise prescription to exercise independently                Exercise Goals Re-Evaluation :   Discharge Exercise Prescription (Final Exercise Prescription Changes):  Exercise Prescription Changes - 06/23/20 1600       Response to Exercise   Blood Pressure (Admit) 106/58    Blood Pressure (Exercise) 144/60    Blood Pressure (Exit) 108/58    Heart Rate (Admit) 86 bpm    Heart Rate (Exercise) 113 bpm    Heart Rate (Exit) 87 bpm    Oxygen Saturation (Admit) 99 %    Oxygen Saturation (Exercise) 96 %    Rating of Perceived Exertion (Exercise) 9    Perceived Dyspnea (Exercise) 1    Symptoms none             Nutrition:  Target Goals: Understanding of nutrition guidelines, daily intake of sodium <1545m, cholesterol <2056m calories 30% from fat and 7% or less from saturated fats, daily to have 5 or more  servings of fruits and vegetables.  Education: All About Nutrition: -Group instruction provided by verbal, written material, interactive activities, discussions, models, and posters to present general guidelines for heart healthy nutrition including fat, fiber, MyPlate, the role of sodium in heart healthy nutrition, utilization of the nutrition label, and utilization of this knowledge for meal planning. Follow up email sent as well. Written material given at graduation.   Biometrics:  Pre Biometrics - 06/23/20 1610       Pre Biometrics   Height '5\' 5"'  (1.651 m)    Weight 232 lb 1.6 oz (105.3 kg)    BMI (Calculated) 38.62    Single Leg Stand 5.28 seconds              Nutrition Therapy Plan and Nutrition Goals:  Nutrition Therapy & Goals - 06/17/20 0850       Intervention Plan   Intervention Prescribe, educate and counsel regarding individualized specific dietary modifications aiming towards targeted core components such as weight, hypertension, lipid management, diabetes, heart failure and other comorbidities.    Expected Outcomes Short Term Goal: Understand basic principles of dietary content, such as calories, fat, sodium, cholesterol and nutrients.;Short Term Goal: A plan has been developed with personal nutrition goals set during dietitian appointment.;Long Term Goal: Adherence to prescribed nutrition plan.             Nutrition Assessments:  MEDIFICTS Score Key: ?70 Need to make dietary changes  40-70 Heart Healthy Diet ? 40 Therapeutic Level Cholesterol Diet  [facility]  Picture Your Plate Total Score on Admission 68      Picture Your Plate Scores: <4<82nhealthy dietary pattern with much room for improvement. 41-50 Dietary pattern unlikely to meet recommendations for good health and room for improvement. 51-60 More healthful dietary pattern, with some room for improvement.  >60 Healthy dietary  pattern, although there may be  some specific behaviors that could be improved.    Nutrition Goals Re-Evaluation:   Nutrition Goals Discharge (Final Nutrition Goals Re-Evaluation):   Psychosocial: Target Goals: Acknowledge presence or absence of significant depression and/or stress, maximize coping skills, provide positive support system. Participant is able to verbalize types and ability to use techniques and skills needed for reducing stress and depression.   Education: Stress, Anxiety, and Depression - Group verbal and visual presentation to define topics covered.  Reviews how body is impacted by stress, anxiety, and depression.  Also discusses healthy ways to reduce stress and to treat/manage anxiety and depression.  Written material given at graduation.   Education: Sleep Hygiene -Provides group verbal and written instruction about how sleep can affect your health.  Define sleep hygiene, discuss sleep cycles and impact of sleep habits. Review good sleep hygiene tips.    Initial Review & Psychosocial Screening:  Initial Psych Review & Screening - 06/17/20 0840       Initial Review   Current issues with Current Stress Concerns    Source of Stress Concerns Chronic Illness;Financial;Occupation    Comments Still social distancing and wearing mask, still paranoid, history of anxiety, still recovering from surgery, still worring about finanances on short term disabilty and currently thinking about applying for disability overall      Family Dynamics   Good Support System? Yes   husband, friends, father (38), 2 daughters, 1 son, 7 grandchilden, 4 brothers   Comments Big support system and she can call any anytime      Barriers   Psychosocial barriers to participate in program The patient should benefit from training in stress management and relaxation.;Psychosocial barriers identified (see note)      Screening Interventions   Interventions Encouraged to exercise;To provide support  and resources with identified psychosocial needs;Provide feedback about the scores to participant    Expected Outcomes Short Term goal: Utilizing psychosocial counselor, staff and physician to assist with identification of specific Stressors or current issues interfering with healing process. Setting desired goal for each stressor or current issue identified.;Long Term Goal: Stressors or current issues are controlled or eliminated.;Short Term goal: Identification and review with participant of any Quality of Life or Depression concerns found by scoring the questionnaire.;Long Term goal: The participant improves quality of Life and PHQ9 Scores as seen by post scores and/or verbalization of changes             Quality of Life Scores:   Quality of Life - 06/23/20 1615       Quality of Life   Select Quality of Life      Quality of Life Scores   Health/Function Pre 23.57 %    Socioeconomic Pre 23.26 %    Psych/Spiritual Pre 28.29 %    Family Pre 26.7 %    GLOBAL Pre 24.96 %            Scores of 19 and below usually indicate a poorer quality of life in these areas.  A difference of  2-3 points is a clinically meaningful difference.  A difference of 2-3 points in the total score of the Quality of Life Index has been associated with significant improvement in overall quality of life, self-image, physical symptoms, and general health in studies assessing change in quality of life.  PHQ-9: Recent Review Flowsheet Data     Depression screen [facility] 2/9 06/23/2020 03/20/2020 10/24/2019 09/20/2019 02/20/2019   Decreased Interest 0 0 0 0 0   Down, Depressed, Hopeless  0 0 0 0 0   PHQ - 2 Score 0 0 0 0 0   Altered sleeping 2 - - - -   Tired, decreased energy 1 - - - -   Change in appetite 0 - - - -   Feeling bad or failure about yourself  0 - - - -   Trouble concentrating 0 - - - -   Moving slowly or fidgety/restless 0 - - - -   PHQ-9 Score 3 - - - -   Difficult doing work/chores Somewhat difficult -  - - -      Interpretation of Total Score  Total Score Depression Severity:  1-4 = Minimal depression, 5-9 = Mild depression, 10-14 = Moderate depression, 15-19 = Moderately severe depression, 20-27 = Severe depression   Psychosocial Evaluation and Intervention:  Psychosocial Evaluation - 06/17/20 0842       Psychosocial Evaluation & Interventions   Interventions Encouraged to exercise with the program and follow exercise prescription;Stress management education;Relaxation education    Comments Vernida is coming into rehab after a valve replacement.  Her valve has been limiting her from doing everything she wants to do.  When traveling and doing things with her family, she has had to sit and be left behind to catch her breath and feel better.  She has a trip planning to [facility] in October for her and her husbands anniversary.  Her biggest stressor is her hob.  Her job is very stressful and she is trying to decide about disability.  She is interested in maybe vocational rehab to help switch up her job.  One of her current stressors also is finances surrounding all of this and being out on short term disability and still being able to make payments.  She has a great support system in her family that she can call upon at any point.  Prior to this, she had never heard of cardiac rehab and is looking forward to learning and absorbing as much as she can.  She wants to get back to her normal self to be able to travel and go again.  She would also like to continue to lose weight.    Expected Outcomes Short: Attend rehab to learn about self and boost health Long: Continue to improve stamina    Continue Psychosocial Services  Follow up required by staff             Psychosocial Re-Evaluation:   Psychosocial Discharge (Final Psychosocial Re-Evaluation):   Vocational Rehabilitation: Provide vocational rehab assistance to qualifying candidates.   Vocational Rehab Evaluation & Intervention:   Vocational Rehab - 06/17/20 0846       Initial Vocational Rehab Evaluation & Intervention   Assessment shows need for Vocational Rehabilitation Yes   pt trying to decided about disbabilty or new job            Education: Education Goals: Education classes will be provided on a variety of topics geared toward better understanding of heart health and risk factor modification. Participant will state understanding/return demonstration of topics presented as noted by education test scores.  Learning Barriers/Preferences:  Learning Barriers/Preferences - 06/17/20 9628       Learning Barriers/Preferences   Learning Barriers Sight   wears bifocal glasses   Learning Preferences None             General Cardiac Education Topics:  AED/CPR: - Group verbal and written instruction with the use of models to demonstrate the basic use  of the AED with the basic ABC's of resuscitation.   Anatomy and Cardiac Procedures: - Group verbal and visual presentation and models provide information about basic cardiac anatomy and function. Reviews the testing methods done to diagnose heart disease and the outcomes of the test results. Describes the treatment choices: Medical Management, Angioplasty, or Coronary Bypass Surgery for treating various heart conditions including Myocardial Infarction, Angina, Valve Disease, and Cardiac Arrhythmias.  Written material given at graduation.   Medication Safety: - Group verbal and visual instruction to review commonly prescribed medications for heart and lung disease. Reviews the medication, class of the drug, and side effects. Includes the steps to properly store meds and maintain the prescription regimen.  Written material given at graduation.   Intimacy: - Group verbal instruction through game format to discuss how heart and lung disease can affect sexual intimacy. Written material given at graduation..   Know Your Numbers and Heart Failure: - Group verbal  and visual instruction to discuss disease risk factors for cardiac and pulmonary disease and treatment options.  Reviews associated critical values for Overweight/Obesity, Hypertension, Cholesterol, and Diabetes.  Discusses basics of heart failure: signs/symptoms and treatments.  Introduces Heart Failure Zone chart for action plan for heart failure.  Written material given at graduation.   Infection Prevention: - Provides verbal and written material to individual with discussion of infection control including proper hand washing and proper equipment cleaning during exercise session. Flowsheet Row Cardiac Rehab from 06/23/2020 in [facility] Cardiac and Pulmonary Rehab  Date 06/23/20  Educator AS  Instruction Review Code 1- Verbalizes Understanding       Falls Prevention: - Provides verbal and written material to individual with discussion of falls prevention and safety. Flowsheet Row Cardiac Rehab from 06/23/2020 in [facility]  Date 06/23/20  Educator AS  Instruction Review Code 1- Verbalizes Understanding       Other: -Provides group and verbal instruction on various topics (see comments)   Knowledge Questionnaire Score:  Knowledge Questionnaire Score - 06/23/20 1619       Knowledge Questionnaire Score   Pre Score 22/26 nutrition angina             Core Components/Risk Factors/Patient Goals at Admission:  Personal Goals and Risk Factors at Admission - 06/23/20 1620       Core Components/Risk Factors/Patient Goals on Admission    Weight Management Yes;Obesity;Weight Loss    Intervention Weight Management: Develop a combined nutrition and exercise program designed to reach desired caloric intake, while maintaining appropriate intake of nutrient and fiber, sodium and fats, and appropriate energy expenditure required for the weight goal.;Weight Management: Provide education and appropriate resources to help participant work on and attain dietary goals.;Weight  Management/Obesity: Establish reasonable short term and long term weight goals.;Obesity: Provide education and appropriate resources to help participant work on and attain dietary goals.    Expected Outcomes Short Term: Continue to assess and modify interventions until short term weight is achieved;Long Term: Adherence to nutrition and physical activity/exercise program aimed toward attainment of established weight goal;Weight Loss: Understanding of general recommendations for a balanced deficit meal plan, which promotes 1-2 lb weight loss per week and includes a negative energy balance of [id number] kcal/d;Understanding recommendations for meals to include 15-35% energy as protein, 25-35% energy from fat, 35-60% energy from carbohydrates, less than 244m of dietary cholesterol, 20-35 gm of total fiber daily;Understanding of distribution of calorie intake throughout the day with the consumption of 4-5 meals/snacks    Heart  Failure Yes    Intervention Provide a combined exercise and nutrition program that is supplemented with education, support and counseling about heart failure. Directed toward relieving symptoms such as shortness of breath, decreased exercise tolerance, and extremity edema.    Expected Outcomes Improve functional capacity of life;Short term: Attendance in program 2-3 days a week with increased exercise capacity. Reported lower sodium intake. Reported increased fruit and vegetable intake. Reports medication compliance.;Short term: Daily weights obtained and reported for increase. Utilizing diuretic protocols set by physician.;Long term: Adoption of self-care skills and reduction of barriers for early signs and symptoms recognition and intervention leading to self-care maintenance.    Hypertension Yes    Intervention Provide education on lifestyle modifcations including regular physical activity/exercise, weight management, moderate sodium restriction and increased consumption of fresh fruit,  vegetables, and low fat dairy, alcohol moderation, and smoking cessation.;Monitor prescription use compliance.    Expected Outcomes Short Term: Continued assessment and intervention until BP is < 140/24m HG in hypertensive participants. < 130/844mHG in hypertensive participants with diabetes, heart failure or chronic kidney disease.;Long Term: Maintenance of blood pressure at goal levels.    Lipids Yes    Intervention Provide education and support for participant on nutrition & aerobic/resistive exercise along with prescribed medications to achieve LDL <7045mHDL >29m12m  Expected Outcomes Short Term: Participant states understanding of desired cholesterol values and is compliant with medications prescribed. Participant is following exercise prescription and nutrition guidelines.;Long Term: Cholesterol controlled with medications as prescribed, with individualized exercise RX and with personalized nutrition plan. Value goals: LDL < 70mg28mL > 40 mg.             Education:Diabetes - Individual verbal and written instruction to review signs/symptoms of diabetes, desired ranges of glucose level fasting, after meals and with exercise. Acknowledge that pre and post exercise glucose checks will be done for 3 sessions at entry of program.   Core Components/Risk Factors/Patient Goals Review:    Core Components/Risk Factors/Patient Goals at Discharge (Final Review):    ITP Comments:  ITP Comments     Row Name 06/17/20 0900 06/23/20 1622 07/02/20 0719 07/28/20 1105 07/30/20 0642   ITP Comments Completed virtual orientation today.  EP evaluation is scheduled for Monday 6/6 at 230pm.  [facility] Completed 6MWT and gym orientation. Initial ITP created and sent for review to Dr. Mark Emily Filbertical Director. 30 Day review completed. Medical Director ITP review done, changes made as directed, and signed approval by Medical Director.   only 2  visits in June Patient has not showed up for her first day of rehab since orientation. Attempted to call patient multiple times. 30 Day review completed. Medical Director ITP review done, changes made as directed, and signed approval by Medical Director.            Comments:

## 2020-07-31 ENCOUNTER — Ambulatory Visit: Payer: 59

## 2020-08-04 ENCOUNTER — Other Ambulatory Visit: Payer: Self-pay

## 2020-08-04 ENCOUNTER — Telehealth: Payer: Self-pay

## 2020-08-04 ENCOUNTER — Ambulatory Visit: Payer: 59

## 2020-08-04 MED ORDER — FLUCONAZOLE 150 MG PO TABS: ORAL_TABLET | ORAL | 0 refills | Status: DC

## 2020-08-04 NOTE — Telephone Encounter (Signed)
Pt called that she had bad yeast infection we send diflucan for her advised that drug interact she said she had before she don't have any problem she going for coumadin check this Wednesday she going to tell them and also she had appt  with [place] Friday

## 2020-08-06 ENCOUNTER — Other Ambulatory Visit: Payer: Self-pay

## 2020-08-06 ENCOUNTER — Ambulatory Visit: Payer: 59

## 2020-08-06 ENCOUNTER — Telehealth: Payer: Self-pay

## 2020-08-06 ENCOUNTER — Ambulatory Visit (INDEPENDENT_AMBULATORY_CARE_PROVIDER_SITE_OTHER): Payer: 59

## 2020-08-06 DIAGNOSIS — I050 Rheumatic mitral stenosis: Secondary | ICD-10-CM | POA: Diagnosis not present

## 2020-08-06 DIAGNOSIS — Z5181 Encounter for therapeutic drug level monitoring: Secondary | ICD-10-CM

## 2020-08-06 DIAGNOSIS — I4891 Unspecified atrial fibrillation: Secondary | ICD-10-CM | POA: Diagnosis not present

## 2020-08-06 LAB — POCT INR: INR: 2 (ref 2.0–3.0)

## 2020-08-06 NOTE — Patient Instructions (Signed)
-   take 1 tablet tonight, then  - continue dosage of warfarin 1/2 tablet every day - Recheck in 1 week

## 2020-08-06 NOTE — Telephone Encounter (Signed)
LMOM asking pt to call back to let us know if she is still taking Diltiazem CD 120 mg.  Received request from pharmacy

## 2020-08-07 ENCOUNTER — Ambulatory Visit: Payer: 59

## 2020-08-07 ENCOUNTER — Telehealth: Payer: Self-pay

## 2020-08-07 DIAGNOSIS — Z952 Presence of prosthetic heart valve: Secondary | ICD-10-CM

## 2020-08-07 NOTE — Telephone Encounter (Signed)
No response back from patient after multiple attempts and letter sent. Will be discharging from Cardiac Rehab at this time.

## 2020-08-07 NOTE — Progress Notes (Signed)
Discharge Progress Report  Patient Details  Name: RYELLE RUVALCABA MRN: [id number] Date of Birth: 12-11-1965 Referring Provider:   Flowsheet Row Cardiac Rehab from 06/23/2020 in [facility]  Referring Provider Gollan        Number of Visits: 1  Reason for Discharge:  Early Exit:  Lack of attendance  Smoking History:  Social History   Tobacco Use  Smoking Status Former   Packs/day: 0.50   Years: 20.00   Pack years: 10.00   Types: Cigarettes   Quit date: 01/2017   Years since quitting: 3.5  Smokeless Tobacco Never    Diagnosis:  S/P MVR (mitral valve replacement)  ADL UCSD:   Initial Exercise Prescription:  Initial Exercise Prescription - 06/23/20 1600       Date of Initial Exercise RX and Referring Provider   Date 06/23/20    Referring Provider Gollan      Treadmill   MPH 1.7    Grade 1    Minutes 15    METs 2.5      Recumbant Bike   Level 2    RPM 60    Watts 24    Minutes 15    METs 2.5      NuStep   Level 2    SPM 80    Minutes 15    METs 2.5      Recumbant Elliptical   Level 1    RPM 50    Watts 24    Minutes 15    METs 2.5      Prescription Details   Frequency (times per week) 3    Duration Progress to 30 minutes of continuous aerobic without signs/symptoms of physical distress      Intensity   Ratings of Perceived Exertion 11-15    Perceived Dyspnea 0-4      Resistance Training   Training Prescription Yes    Weight 3 lb    Reps 10-15             Discharge Exercise Prescription (Final Exercise Prescription Changes):  Exercise Prescription Changes - 06/23/20 1600       Response to Exercise   Blood Pressure (Admit) 106/58    Blood Pressure (Exercise) 144/60    Blood Pressure (Exit) 108/58    Heart Rate (Admit) 86 bpm    Heart Rate (Exercise) 113 bpm    Heart Rate (Exit) 87 bpm    Oxygen Saturation (Admit) 99 %    Oxygen Saturation (Exercise) 96 %    Rating of Perceived Exertion (Exercise) 9     Perceived Dyspnea (Exercise) 1    Symptoms none             Functional Capacity:  6 Minute Walk     Row Name 06/23/20 1600         6 Minute Walk   Phase Initial     Distance 905 feet     Walk Time 6 minutes     # of Rest Breaks 0     MPH 1.7     METS 2.73     RPE 9     Perceived Dyspnea  1     VO2 Peak 9.5     Symptoms No     Resting HR 86 bpm     Resting BP 106/58     Resting Oxygen Saturation  99 %     Exercise Oxygen Saturation  during 6 min walk 96 %  Max Ex. HR 113 bpm     Max Ex. BP 144/60     2 Minute Post BP 108/58               Quality of Life:  Quality of Life - 06/23/20 1615       Quality of Life   Select Quality of Life      Quality of Life Scores   Health/Function Pre 23.57 %    Socioeconomic Pre 23.26 %    Psych/Spiritual Pre 28.29 %    Family Pre 26.7 %    GLOBAL Pre 24.96 %              Nutrition & Weight - Outcomes:  Pre Biometrics - 06/23/20 1610       Pre Biometrics   Height 5\' 5"  (1.651 m)    Weight 232 lb 1.6 oz (105.3 kg)    BMI (Calculated) 38.62    Single Leg Stand 5.28 seconds              Nutrition:  Nutrition Therapy & Goals - 06/17/20 0850       Intervention Plan   Intervention Prescribe, educate and counsel regarding individualized specific dietary modifications aiming towards targeted core components such as weight, hypertension, lipid management, diabetes, heart failure and other comorbidities.    Expected Outcomes Short Term Goal: Understand basic principles of dietary content, such as calories, fat, sodium, cholesterol and nutrients.;Short Term Goal: A plan has been developed with personal nutrition goals set during dietitian appointment.;Long Term Goal: Adherence to prescribed nutrition plan.              Goals reviewed with patient; copy given to patient.

## 2020-08-07 NOTE — Progress Notes (Signed)
Cardiac Individual Treatment Plan  Patient Details  Name: Leslie [name] MRN: [id number] Date of Birth: 12/28/1965 Referring Provider:   Flowsheet Row Cardiac Rehab from 06/23/2020 in [facility] Cardiac and Pulmonary Rehab  Referring Provider Gollan       Initial Encounter Date:  Flowsheet Row Cardiac Rehab from 06/23/2020 in [facility] Cardiac and Pulmonary Rehab  Date 06/23/20       Visit Diagnosis: S/P MVR (mitral valve replacement)  Patient's Home Medications on Admission:  Current Outpatient Medications:    acetaminophen (TYLENOL) 500 MG tablet, Take 1,000 mg by mouth every 6 (six) hours as needed for moderate pain., Disp: , Rfl:    albuterol (VENTOLIN HFA) 108 (90 Base) MCG/ACT inhaler, Inhale 2 puffs into the lungs every 6 (six) hours as needed for wheezing or shortness of breath., Disp: 8 g, Rfl: 2   ALPRAZolam (XANAX) 0.5 MG tablet, Take 0.5 tablets (0.25 mg total) by mouth at bedtime as needed for anxiety., Disp: 30 tablet, Rfl: 1   amLODipine (NORVASC) 10 MG tablet, Take 1 tablet (10 mg total) by mouth daily., Disp: 90 tablet, Rfl: 1   aspirin EC 81 MG tablet, Take 1 tablet (81 mg total) by mouth daily. Swallow whole., Disp: 100 tablet, Rfl: 2   atorvastatin (LIPITOR) 80 MG tablet, TAKE 1 TABLET BY MOUTH EVERY DAY AT 6 PM, Disp: 90 tablet, Rfl: 1   calcium carbonate (TUMS - DOSED IN MG ELEMENTAL CALCIUM) 500 MG chewable tablet, Chew 500 mg by mouth daily as needed for indigestion or heartburn., Disp: , Rfl:    ferrous [id number] C-folic acid (TRINSICON / FOLTRIN) capsule, Take 1 capsule by mouth 2 (two) times daily after a meal., Disp: 60 capsule, Rfl: 2   fluconazole (DIFLUCAN) 150 MG tablet, Take 1 tab po once and may repeat in 3 days if symptoms Persist, Disp: 3 tablet, Rfl: 0   fluticasone (FLONASE) 50 MCG/ACT nasal spray, Place 1 spray into both nostrils daily as needed for rhinitis., Disp: , Rfl:    metoprolol succinate (TOPROL-XL) 50 MG 24 hr tablet, Take 1 tablet (50 mg)  by mouth in the morning & take 2 tablets (100 mg) by mouth in the evening. Take with or immediately following a meal., Disp: 270 tablet, Rfl: 2   omeprazole (PRILOSEC) 40 MG capsule, Take 1 capsule (40 mg total) by mouth daily., Disp: 90 capsule, Rfl: 3   omeprazole (PRILOSEC) 40 MG capsule, Take by mouth., Disp: , Rfl:    potassium chloride SA (KLOR-CON) 20 MEQ tablet, Take 1 tablet (20 mEq total) by mouth 2 (two) times daily., Disp: 60 tablet, Rfl: 5   spironolactone (ALDACTONE) 25 MG tablet, Take 0.5 tablets (12.5 mg total) by mouth daily., Disp: 90 tablet, Rfl: 1   torsemide (DEMADEX) 20 MG tablet, Take 2 tablets (40 mg total) by mouth 2 (two) times daily., Disp: 180 tablet, Rfl: 1   warfarin (COUMADIN) 2 MG tablet, TAKE 1 TABLET BY MOUTH DAILY AT 4PM AS INSTRUCTED BY COUMADIN CLINIC FOR LIFETIME, Disp: 90 tablet, Rfl: 1  Past Medical History: Past Medical History:  Diagnosis Date   (HFpEF) heart failure with preserved ejection fraction ([place])    a. 08/2017 Echo: EF 55-60%.  Grade 2 diastolic dysfunction.  Moderate mitral stenosis.   Allergy    Anemia    Anxiety    BRCA negative 03/22/2013   Bronchitis 02/19/2016   ON LEVAQUIN PO   Chest tightness    CHF (congestive heart failure) (HCC)    Cigarette  smoker 09/11/2017   8-10 day   Dyspnea    Dysrhythmia    GERD (gastroesophageal reflux disease)    Heart murmur    Per pt. dx by Dr. Roxy Manns via stethescope   History of kidney stones    Hyperlipidemia    Hypertension    Interstitial lung disease ([place])    a. CT 2013 b. 02/2018 CXR noted recurrent intersistial changes ILD vs chronic bronchitis   Moderate mitral stenosis    a.  08/2017 TEE: EF 60 to 65%.  Moderate mitral stenosis.  Mean gradient 14 mmHg.  Valve area 2.59 cm by planimetry, 2.72 cm by pressure half-time.   Persistent atrial fibrillation ([place])    a. 08/2017 s/p TEE/DCCV; b. CHA2DS2VASc = 2-->warfarin.   Pneumonia    S/P Maze operation for atrial fibrillation 04/29/2020    Complete bilateral atrial lesion set using bipolar radiofrequency and cryothermy ablation with clipping of LA appendage   S/P mitral valve replacement with Onyx bileaflet mechanical valve 04/29/2020   27/29 mm Onyx-Valve    Tobacco Use: Social History   Tobacco Use  Smoking Status Former   Packs/day: 0.50   Years: 20.00   Pack years: 10.00   Types: Cigarettes   Quit date: 01/2017   Years since quitting: 3.5  Smokeless Tobacco Never    Labs: Recent Review Flowsheet Data     Labs for ITP Cardiac and Pulmonary Rehab Latest Ref Rng & Units 05/07/2020 05/07/2020 05/09/2020 05/10/2020 06/06/2020   Cholestrol 100 - 199 mg/dL - - - - -   LDLCALC 0 - 99 mg/dL - - - - -   HDL >39 mg/dL - - - - -   Trlycerides 0 - 149 mg/dL - - - - -   Hemoglobin A1c 4.0 - 5.6 % - - - - 5.8(A)   PHART 7.350 - 7.450 - - - - -   PCO2ART 32.0 - 48.0 mmHg - - - - -   HCO3 20.0 - 28.0 mmol/L - - - - -   TCO2 22 - 32 mmol/L - - - - -   ACIDBASEDEF 0.0 - 2.0 mmol/L - - - - -   O2SAT % 54.5 60.8 52.4 63.8 -        Exercise Target Goals: Exercise Program Goal: Individual exercise prescription set using results from initial 6 min walk test and THRR while considering  patient's activity barriers and safety.   Exercise Prescription Goal: Initial exercise prescription builds to 30-45 minutes a day of aerobic activity, 2-3 days per week.  Home exercise guidelines will be given to patient during program as part of exercise prescription that the participant will acknowledge.   Education: Aerobic Exercise: - Group verbal and visual presentation on the components of exercise prescription. Introduces F.I.T.T principle from ACSM for exercise prescriptions.  Reviews F.I.T.T. principles of aerobic exercise including progression. Written material given at graduation.   Education: Resistance Exercise: - Group verbal and visual presentation on the components of exercise prescription. Introduces F.I.T.T principle from ACSM  for exercise prescriptions  Reviews F.I.T.T. principles of resistance exercise including progression. Written material given at graduation.    Education: Exercise & Equipment Safety: - Individual verbal instruction and demonstration of equipment use and safety with use of the equipment. Flowsheet Row Cardiac Rehab from 06/23/2020 in [facility] Cardiac and Pulmonary Rehab  Date 06/23/20  Educator AS  Instruction Review Code 1- Verbalizes Understanding       Education: Exercise Physiology & General Exercise Guidelines: - Group  verbal and written instruction with models to review the exercise physiology of the cardiovascular system and associated critical values. Provides general exercise guidelines with specific guidelines to those with heart or lung disease.    Education: Flexibility, Balance, Mind/Body Relaxation: - Group verbal and visual presentation with interactive activity on the components of exercise prescription. Introduces F.I.T.T principle from ACSM for exercise prescriptions. Reviews F.I.T.T. principles of flexibility and balance exercise training including progression. Also discusses the mind body connection.  Reviews various relaxation techniques to help reduce and manage stress (i.e. Deep breathing, progressive muscle relaxation, and visualization). Balance handout provided to take home. Written material given at graduation.   Activity Barriers & Risk Stratification:  Activity Barriers & Cardiac Risk Stratification - 06/17/20 0839       Activity Barriers & Cardiac Risk Stratification   Activity Barriers Left Knee Replacement;Right Knee Replacement;Deconditioning;Muscular Weakness;Shortness of Breath;Decreased Ventricular Function    Cardiac Risk Stratification Moderate             6 Minute Walk:  6 Minute Walk     Row Name 06/23/20 1600         6 Minute Walk   Phase Initial     Distance 905 feet     Walk Time 6 minutes     # of Rest Breaks 0     MPH 1.7     METS  2.73     RPE 9     Perceived Dyspnea  1     VO2 Peak 9.5     Symptoms No     Resting HR 86 bpm     Resting BP 106/58     Resting Oxygen Saturation  99 %     Exercise Oxygen Saturation  during 6 min walk 96 %     Max Ex. HR 113 bpm     Max Ex. BP 144/60     2 Minute Post BP 108/58              Oxygen Initial Assessment:   Oxygen Re-Evaluation:   Oxygen Discharge (Final Oxygen Re-Evaluation):   Initial Exercise Prescription:  Initial Exercise Prescription - 06/23/20 1600       Date of Initial Exercise RX and Referring Provider   Date 06/23/20    Referring Provider Gollan      Treadmill   MPH 1.7    Grade 1    Minutes 15    METs 2.5      Recumbant Bike   Level 2    RPM 60    Watts 24    Minutes 15    METs 2.5      NuStep   Level 2    SPM 80    Minutes 15    METs 2.5      Recumbant Elliptical   Level 1    RPM 50    Watts 24    Minutes 15    METs 2.5      Prescription Details   Frequency (times per week) 3    Duration Progress to 30 minutes of continuous aerobic without signs/symptoms of physical distress      Intensity   Ratings of Perceived Exertion 11-15    Perceived Dyspnea 0-4      Resistance Training   Training Prescription Yes    Weight 3 lb    Reps 10-15             Perform Capillary Blood Glucose checks as needed.  Exercise  Prescription Changes:   Exercise Prescription Changes     Row Name 06/23/20 1600             Response to Exercise   Blood Pressure (Admit) 106/58       Blood Pressure (Exercise) 144/60       Blood Pressure (Exit) 108/58       Heart Rate (Admit) 86 bpm       Heart Rate (Exercise) 113 bpm       Heart Rate (Exit) 87 bpm       Oxygen Saturation (Admit) 99 %       Oxygen Saturation (Exercise) 96 %       Rating of Perceived Exertion (Exercise) 9       Perceived Dyspnea (Exercise) 1       Symptoms none                Exercise Comments:   Exercise Goals and Review:   Exercise Goals      Row Name 06/23/20 1609             Exercise Goals   Increase Physical Activity Yes       Intervention Provide advice, education, support and counseling about physical activity/exercise needs.;Develop an individualized exercise prescription for aerobic and resistive training based on initial evaluation findings, risk stratification, comorbidities and participant's personal goals.       Expected Outcomes Short Term: Attend rehab on a regular basis to increase amount of physical activity.;Long Term: Add in home exercise to make exercise part of routine and to increase amount of physical activity.;Long Term: Exercising regularly at least 3-5 days a week.       Increase Strength and Stamina Yes       Intervention Provide advice, education, support and counseling about physical activity/exercise needs.;Develop an individualized exercise prescription for aerobic and resistive training based on initial evaluation findings, risk stratification, comorbidities and participant's personal goals.       Expected Outcomes Short Term: Increase workloads from initial exercise prescription for resistance, speed, and METs.;Short Term: Perform resistance training exercises routinely during rehab and add in resistance training at home;Long Term: Improve cardiorespiratory fitness, muscular endurance and strength as measured by increased METs and functional capacity (6MWT)       Able to understand and use rate of perceived exertion (RPE) scale Yes       Intervention Provide education and explanation on how to use RPE scale       Expected Outcomes Short Term: Able to use RPE daily in rehab to express subjective intensity level;Long Term:  Able to use RPE to guide intensity level when exercising independently       Able to understand and use Dyspnea scale Yes       Intervention Provide education and explanation on how to use Dyspnea scale       Expected Outcomes Short Term: Able to use Dyspnea scale daily in rehab to  express subjective sense of shortness of breath during exertion;Long Term: Able to use Dyspnea scale to guide intensity level when exercising independently       Knowledge and understanding of Target Heart Rate Range (THRR) Yes       Intervention Provide education and explanation of THRR including how the numbers were predicted and where they are located for reference       Expected Outcomes Short Term: Able to state/look up THRR;Short Term: Able to use daily as guideline for intensity in rehab;Long Term: Able to  use THRR to govern intensity when exercising independently       Able to check pulse independently Yes       Intervention Provide education and demonstration on how to check pulse in carotid and radial arteries.;Review the importance of being able to check your own pulse for safety during independent exercise       Expected Outcomes Short Term: Able to explain why pulse checking is important during independent exercise;Long Term: Able to check pulse independently and accurately       Understanding of Exercise Prescription Yes       Intervention Provide education, explanation, and written materials on patient's individual exercise prescription       Expected Outcomes Short Term: Able to explain program exercise prescription;Long Term: Able to explain home exercise prescription to exercise independently                Exercise Goals Re-Evaluation :   Discharge Exercise Prescription (Final Exercise Prescription Changes):  Exercise Prescription Changes - 06/23/20 1600       Response to Exercise   Blood Pressure (Admit) 106/58    Blood Pressure (Exercise) 144/60    Blood Pressure (Exit) 108/58    Heart Rate (Admit) 86 bpm    Heart Rate (Exercise) 113 bpm    Heart Rate (Exit) 87 bpm    Oxygen Saturation (Admit) 99 %    Oxygen Saturation (Exercise) 96 %    Rating of Perceived Exertion (Exercise) 9    Perceived Dyspnea (Exercise) 1    Symptoms none             Nutrition:   Target Goals: Understanding of nutrition guidelines, daily intake of sodium <1521m, cholesterol <2027m calories 30% from fat and 7% or less from saturated fats, daily to have 5 or more servings of fruits and vegetables.  Education: All About Nutrition: -Group instruction provided by verbal, written material, interactive activities, discussions, models, and posters to present general guidelines for heart healthy nutrition including fat, fiber, MyPlate, the role of sodium in heart healthy nutrition, utilization of the nutrition label, and utilization of this knowledge for meal planning. Follow up email sent as well. Written material given at graduation.   Biometrics:  Pre Biometrics - 06/23/20 1610       Pre Biometrics   Height '5\' 5"'  (1.651 m)    Weight 232 lb 1.6 oz (105.3 kg)    BMI (Calculated) 38.62    Single Leg Stand 5.28 seconds              Nutrition Therapy Plan and Nutrition Goals:  Nutrition Therapy & Goals - 06/17/20 0850       Intervention Plan   Intervention Prescribe, educate and counsel regarding individualized specific dietary modifications aiming towards targeted core components such as weight, hypertension, lipid management, diabetes, heart failure and other comorbidities.    Expected Outcomes Short Term Goal: Understand basic principles of dietary content, such as calories, fat, sodium, cholesterol and nutrients.;Short Term Goal: A plan has been developed with personal nutrition goals set during dietitian appointment.;Long Term Goal: Adherence to prescribed nutrition plan.             Nutrition Assessments:  MEDIFICTS Score Key: ?70 Need to make dietary changes  40-70 Heart Healthy Diet ? 40 Therapeutic Level Cholesterol Diet  [facility]  Picture Your Plate Total Score on Admission 68      Picture Your Plate Scores: <4<09  Unhealthy dietary pattern with much room for  improvement. 41-50 Dietary pattern unlikely to meet recommendations for good health and room for improvement. 51-60 More healthful dietary pattern, with some room for improvement.  >60 Healthy dietary pattern, although there may be some specific behaviors that could be improved.    Nutrition Goals Re-Evaluation:   Nutrition Goals Discharge (Final Nutrition Goals Re-Evaluation):   Psychosocial: Target Goals: Acknowledge presence or absence of significant depression and/or stress, maximize coping skills, provide positive support system. Participant is able to verbalize types and ability to use techniques and skills needed for reducing stress and depression.   Education: Stress, Anxiety, and Depression - Group verbal and visual presentation to define topics covered.  Reviews how body is impacted by stress, anxiety, and depression.  Also discusses healthy ways to reduce stress and to treat/manage anxiety and depression.  Written material given at graduation.   Education: Sleep Hygiene -Provides group verbal and written instruction about how sleep can affect your health.  Define sleep hygiene, discuss sleep cycles and impact of sleep habits. Review good sleep hygiene tips.    Initial Review & Psychosocial Screening:  Initial Psych Review & Screening - 06/17/20 0840       Initial Review   Current issues with Current Stress Concerns    Source of Stress Concerns Chronic Illness;Financial;Occupation    Comments Still social distancing and wearing mask, still paranoid, history of anxiety, still recovering from surgery, still worring about finanances on short term disabilty and currently thinking about applying for disability overall      Family Dynamics   Good Support System? Yes   husband, friends, father (35), 2 daughters, 1 son, 7 grandchilden, 4 brothers   Comments Big support system and she can call any anytime      Barriers   Psychosocial barriers to participate in program The  patient should benefit from training in stress management and relaxation.;Psychosocial barriers identified (see note)      Screening Interventions   Interventions Encouraged to exercise;To provide support and resources with identified psychosocial needs;Provide feedback about the scores to participant    Expected Outcomes Short Term goal: Utilizing psychosocial counselor, staff and physician to assist with identification of specific Stressors or current issues interfering with healing process. Setting desired goal for each stressor or current issue identified.;Long Term Goal: Stressors or current issues are controlled or eliminated.;Short Term goal: Identification and review with participant of any Quality of Life or Depression concerns found by scoring the questionnaire.;Long Term goal: The participant improves quality of Life and PHQ9 Scores as seen by post scores and/or verbalization of changes             Quality of Life Scores:   Quality of Life - 06/23/20 1615       Quality of Life   Select Quality of Life      Quality of Life Scores   Health/Function Pre 23.57 %    Socioeconomic Pre 23.26 %    Psych/Spiritual Pre 28.29 %    Family Pre 26.7 %    GLOBAL Pre 24.96 %            Scores of 19 and below usually indicate a poorer quality of life in these areas.  A difference of  2-3 points is a clinically meaningful difference.  A difference of 2-3 points in the total score of the Quality of Life Index has been associated with significant improvement in overall quality of life, self-image, physical symptoms, and general  health in studies assessing change in quality of life.  PHQ-9: Recent Review Flowsheet Data     Depression screen [facility] 2/9 06/23/2020 03/20/2020 10/24/2019 09/20/2019 02/20/2019   Decreased Interest 0 0 0 0 0   Down, Depressed, Hopeless 0 0 0 0 0   PHQ - 2 Score 0 0 0 0 0   Altered sleeping 2 - - - -   Tired, decreased energy 1 - - - -   Change in appetite 0 - - - -    Feeling bad or failure about yourself  0 - - - -   Trouble concentrating 0 - - - -   Moving slowly or fidgety/restless 0 - - - -   PHQ-9 Score 3 - - - -   Difficult doing work/chores Somewhat difficult - - - -      Interpretation of Total Score  Total Score Depression Severity:  1-4 = Minimal depression, 5-9 = Mild depression, 10-14 = Moderate depression, 15-19 = Moderately severe depression, 20-27 = Severe depression   Psychosocial Evaluation and Intervention:  Psychosocial Evaluation - 06/17/20 0842       Psychosocial Evaluation & Interventions   Interventions Encouraged to exercise with the program and follow exercise prescription;Stress management education;Relaxation education    Comments Thomasene is coming into rehab after a valve replacement.  Her valve has been limiting her from doing everything she wants to do.  When traveling and doing things with her family, she has had to sit and be left behind to catch her breath and feel better.  She has a trip planning to [facility] in October for her and her husbands anniversary.  Her biggest stressor is her hob.  Her job is very stressful and she is trying to decide about disability.  She is interested in maybe vocational rehab to help switch up her job.  One of her current stressors also is finances surrounding all of this and being out on short term disability and still being able to make payments.  She has a great support system in her family that she can call upon at any point.  Prior to this, she had never heard of cardiac rehab and is looking forward to learning and absorbing as much as she can.  She wants to get back to her normal self to be able to travel and go again.  She would also like to continue to lose weight.    Expected Outcomes Short: Attend rehab to learn about self and boost health Long: Continue to improve stamina    Continue Psychosocial Services  Follow up required by staff             Psychosocial  Re-Evaluation:   Psychosocial Discharge (Final Psychosocial Re-Evaluation):   Vocational Rehabilitation: Provide vocational rehab assistance to qualifying candidates.   Vocational Rehab Evaluation & Intervention:  Vocational Rehab - 06/17/20 0846       Initial Vocational Rehab Evaluation & Intervention   Assessment shows need for Vocational Rehabilitation Yes   pt trying to decided about disbabilty or new job            Education: Education Goals: Education classes will be provided on a variety of topics geared toward better understanding of heart health and risk factor modification. Participant will state understanding/return demonstration of topics presented as noted by education test scores.  Learning Barriers/Preferences:  Learning Barriers/Preferences - 06/17/20 1840       Learning Barriers/Preferences   Learning Barriers Sight  wears bifocal glasses   Learning Preferences None             General Cardiac Education Topics:  AED/CPR: - Group verbal and written instruction with the use of models to demonstrate the basic use of the AED with the basic ABC's of resuscitation.   Anatomy and Cardiac Procedures: - Group verbal and visual presentation and models provide information about basic cardiac anatomy and function. Reviews the testing methods done to diagnose heart disease and the outcomes of the test results. Describes the treatment choices: Medical Management, Angioplasty, or Coronary Bypass Surgery for treating various heart conditions including Myocardial Infarction, Angina, Valve Disease, and Cardiac Arrhythmias.  Written material given at graduation.   Medication Safety: - Group verbal and visual instruction to review commonly prescribed medications for heart and lung disease. Reviews the medication, class of the drug, and side effects. Includes the steps to properly store meds and maintain the prescription regimen.  Written material given at  graduation.   Intimacy: - Group verbal instruction through game format to discuss how heart and lung disease can affect sexual intimacy. Written material given at graduation..   Know Your Numbers and Heart Failure: - Group verbal and visual instruction to discuss disease risk factors for cardiac and pulmonary disease and treatment options.  Reviews associated critical values for Overweight/Obesity, Hypertension, Cholesterol, and Diabetes.  Discusses basics of heart failure: signs/symptoms and treatments.  Introduces Heart Failure Zone chart for action plan for heart failure.  Written material given at graduation.   Infection Prevention: - Provides verbal and written material to individual with discussion of infection control including proper hand washing and proper equipment cleaning during exercise session. Flowsheet Row Cardiac Rehab from 06/23/2020 in [facility] Cardiac and Pulmonary Rehab  Date 06/23/20  Educator AS  Instruction Review Code 1- Verbalizes Understanding       Falls Prevention: - Provides verbal and written material to individual with discussion of falls prevention and safety. Flowsheet Row Cardiac Rehab from 06/23/2020 in [facility] Cardiac and Pulmonary Rehab  Date 06/23/20  Educator AS  Instruction Review Code 1- Verbalizes Understanding       Other: -Provides group and verbal instruction on various topics (see comments)   Knowledge Questionnaire Score:  Knowledge Questionnaire Score - 06/23/20 1619       Knowledge Questionnaire Score   Pre Score 22/26 nutrition angina             Core Components/Risk Factors/Patient Goals at Admission:  Personal Goals and Risk Factors at Admission - 06/23/20 1620       Core Components/Risk Factors/Patient Goals on Admission    Weight Management Yes;Obesity;Weight Loss    Intervention Weight Management: Develop a combined nutrition and exercise program designed to reach desired caloric intake, while maintaining appropriate  intake of nutrient and fiber, sodium and fats, and appropriate energy expenditure required for the weight goal.;Weight Management: Provide education and appropriate resources to help participant work on and attain dietary goals.;Weight Management/Obesity: Establish reasonable short term and long term weight goals.;Obesity: Provide education and appropriate resources to help participant work on and attain dietary goals.    Expected Outcomes Short Term: Continue to assess and modify interventions until short term weight is achieved;Long Term: Adherence to nutrition and physical activity/exercise program aimed toward attainment of established weight goal;Weight Loss: Understanding of general recommendations for a balanced deficit meal plan, which promotes 1-2 lb weight loss per week and includes a negative energy balance of [phone] kcal/d;Understanding recommendations for meals to include  15-35% energy as protein, 25-35% energy from fat, 35-60% energy from carbohydrates, less than 283m of dietary cholesterol, 20-35 gm of total fiber daily;Understanding of distribution of calorie intake throughout the day with the consumption of 4-5 meals/snacks    Heart Failure Yes    Intervention Provide a combined exercise and nutrition program that is supplemented with education, support and counseling about heart failure. Directed toward relieving symptoms such as shortness of breath, decreased exercise tolerance, and extremity edema.    Expected Outcomes Improve functional capacity of life;Short term: Attendance in program 2-3 days a week with increased exercise capacity. Reported lower sodium intake. Reported increased fruit and vegetable intake. Reports medication compliance.;Short term: Daily weights obtained and reported for increase. Utilizing diuretic protocols set by physician.;Long term: Adoption of self-care skills and reduction of barriers for early signs and symptoms recognition and intervention leading to  self-care maintenance.    Hypertension Yes    Intervention Provide education on lifestyle modifcations including regular physical activity/exercise, weight management, moderate sodium restriction and increased consumption of fresh fruit, vegetables, and low fat dairy, alcohol moderation, and smoking cessation.;Monitor prescription use compliance.    Expected Outcomes Short Term: Continued assessment and intervention until BP is < 140/973mHG in hypertensive participants. < 130/8035mG in hypertensive participants with diabetes, heart failure or chronic kidney disease.;Long Term: Maintenance of blood pressure at goal levels.    Lipids Yes    Intervention Provide education and support for participant on nutrition & aerobic/resistive exercise along with prescribed medications to achieve LDL <27m14mDL >40mg36m Expected Outcomes Short Term: Participant states understanding of desired cholesterol values and is compliant with medications prescribed. Participant is following exercise prescription and nutrition guidelines.;Long Term: Cholesterol controlled with medications as prescribed, with individualized exercise RX and with personalized nutrition plan. Value goals: LDL < 27mg,33m > 40 mg.             Education:Diabetes - Individual verbal and written instruction to review signs/symptoms of diabetes, desired ranges of glucose level fasting, after meals and with exercise. Acknowledge that pre and post exercise glucose checks will be done for 3 sessions at entry of program.   Core Components/Risk Factors/Patient Goals Review:    Core Components/Risk Factors/Patient Goals at Discharge (Final Review):    ITP Comments:  ITP Comments     Row Name 06/17/20 0900 06/23/20 1622 07/02/20 0719 07/28/20 1105 07/30/20 0642   ITP Comments Completed virtual orientation today.  EP evaluation is scheduled for Monday 6/6 at 230pm.  [facility]. Completed  6MWT and gym orientation. Initial ITP created and sent for review to Dr. Mark MEmily Filbertcal Director. 30 Day review completed. Medical Director ITP review done, changes made as directed, and signed approval by Medical Director.   only 2 visits in June Patient has not showed up for her first day of rehab since orientation. Attempted to call patient multiple times. 30 Day review completed. Medical Director ITP review done, changes made as directed, and signed approval by Medical Director.    Row [place]07/21/22 1505           ITP Comments No response back from patient after multiple attempts and letter sent. Will be discharging from Cardiac Rehab at this time.                Comments: Discharge ITP

## 2020-08-08 ENCOUNTER — Ambulatory Visit (INDEPENDENT_AMBULATORY_CARE_PROVIDER_SITE_OTHER): Payer: 59 | Admitting: Physician Assistant

## 2020-08-08 ENCOUNTER — Encounter: Payer: Self-pay | Admitting: Physician Assistant

## 2020-08-08 ENCOUNTER — Other Ambulatory Visit: Payer: Self-pay

## 2020-08-08 ENCOUNTER — Telehealth: Payer: Self-pay

## 2020-08-08 DIAGNOSIS — D649 Anemia, unspecified: Secondary | ICD-10-CM

## 2020-08-08 DIAGNOSIS — R300 Dysuria: Secondary | ICD-10-CM | POA: Diagnosis not present

## 2020-08-08 DIAGNOSIS — Z954 Presence of other heart-valve replacement: Secondary | ICD-10-CM

## 2020-08-08 DIAGNOSIS — R7989 Other specified abnormal findings of blood chemistry: Secondary | ICD-10-CM

## 2020-08-08 DIAGNOSIS — I10 Essential (primary) hypertension: Secondary | ICD-10-CM

## 2020-08-08 LAB — POCT URINALYSIS DIPSTICK
Bilirubin, UA: NEGATIVE
Blood, UA: POSITIVE
Glucose, UA: NEGATIVE
Ketones, UA: NEGATIVE
Leukocytes, UA: NEGATIVE
Nitrite, UA: NEGATIVE
Protein, UA: NEGATIVE
Spec Grav, UA: 1.01 (ref 1.010–1.025)
Urobilinogen, UA: 0.2 E.U./dL
pH, UA: 6.5 (ref 5.0–8.0)

## 2020-08-08 MED ORDER — NITROFURANTOIN MONOHYD MACRO 100 MG PO CAPS: ORAL_CAPSULE | ORAL | 0 refills | Status: DC

## 2020-08-08 NOTE — Telephone Encounter (Signed)
Pt is no longer taking Diltiazem CD 120 mg capsules since April 2022

## 2020-08-08 NOTE — Progress Notes (Signed)
.  ip

## 2020-08-08 NOTE — Progress Notes (Signed)
[facility] [place], [place] [address]  Internal MEDICINE  Office Visit Note  Patient Name: Leslie [name]  [id number]  [id number]  Date of Service: 08/13/2020  Chief Complaint  Patient presents with   Follow-up    Med review, poss UTI    HPI Pt is here for routine follow up -She states she is doing well apart from Uti symptoms. Reports she has burning with urination and some low abdominal pain. Thought it was a yeast infection earlier this week and was given diflucan, but symptoms persist/ have evolved.  -Discussed that her creatinine has gon eup further and that we will need to discuss lowering her diuretics with her cardiologist. Also dicussed obtaining a renal US for further evaluation which she agreed with -BP and HR well controlled today -Reports regular INR checks with cardiology -Since last visit has seen hematology for normocytic anemia who did additional lab work and recommend oral iron supplementation and will follow up with her again in 3 months.   Current Medication: Outpatient Encounter Medications as of 08/08/2020  Medication Sig   acetaminophen (TYLENOL) 500 MG tablet Take 1,000 mg by mouth every 6 (six) hours as needed for moderate pain.   albuterol (VENTOLIN HFA) 108 (90 Base) MCG/ACT inhaler Inhale 2 puffs into the lungs every 6 (six) hours as needed for wheezing or shortness of breath.   ALPRAZolam (XANAX) 0.5 MG tablet Take 0.5 tablets (0.25 mg total) by mouth at bedtime as needed for anxiety.   amLODipine (NORVASC) 10 MG tablet Take 1 tablet (10 mg total) by mouth daily.   aspirin EC 81 MG tablet Take 1 tablet (81 mg total) by mouth daily. Swallow whole.   atorvastatin (LIPITOR) 80 MG tablet TAKE 1 TABLET BY MOUTH EVERY DAY AT 6 PM   calcium carbonate (TUMS - DOSED IN MG ELEMENTAL CALCIUM) 500 MG chewable tablet Chew 500 mg by mouth daily as needed for indigestion or heartburn.   ferrous [id number] C-folic acid (TRINSICON /  FOLTRIN) capsule Take 1 capsule by mouth 2 (two) times daily after a meal.   fluconazole (DIFLUCAN) 150 MG tablet Take 1 tab po once and may repeat in 3 days if symptoms Persist   fluticasone (FLONASE) 50 MCG/ACT nasal spray Place 1 spray into both nostrils daily as needed for rhinitis.   metoprolol succinate (TOPROL-XL) 50 MG 24 hr tablet Take 1 tablet (50 mg) by mouth in the morning & take 2 tablets (100 mg) by mouth in the evening. Take with or immediately following a meal.   nitrofurantoin, macrocrystal-monohydrate, (MACROBID) 100 MG capsule Take 1 cap twice per day for 10 days.   omeprazole (PRILOSEC) 40 MG capsule Take 1 capsule (40 mg total) by mouth daily.   potassium chloride SA (KLOR-CON) 20 MEQ tablet Take 1 tablet (20 mEq total) by mouth 2 (two) times daily.   spironolactone (ALDACTONE) 25 MG tablet Take 0.5 tablets (12.5 mg total) by mouth daily.   torsemide (DEMADEX) 20 MG tablet Take 2 tablets (40 mg total) by mouth 2 (two) times daily.   warfarin (COUMADIN) 2 MG tablet TAKE 1 TABLET BY MOUTH DAILY AT 4PM AS INSTRUCTED BY COUMADIN CLINIC FOR LIFETIME   omeprazole (PRILOSEC) 40 MG capsule Take by mouth. (Patient not taking: Reported on 08/08/2020)   No facility-administered encounter medications on file as of 08/08/2020.    Surgical History: Past Surgical History:  Procedure Laterality Date   ABDOMINAL HYSTERECTOMY     total   ABDOMINAL HYSTERECTOMY  BREAST BIOPSY Bilateral 2012   BREAST BIOPSY Right 08-07-12   fibroadenomatous changes and columnar cells   BREAST BIOPSY  02/03/2015   stereo byrnett   BUBBLE STUDY  04/01/2020   Procedure: BUBBLE STUDY;  Surgeon: [name], MD;  Location: [place];  Service: Cardiovascular;;   CARDIAC CATHETERIZATION     CHOLECYSTECTOMY N/A 02/27/2016   Procedure: LAPAROSCOPIC CHOLECYSTECTOMY;  Surgeon: Christene Lye, MD;  Location: [facility] ORS;  Service: General;  Laterality: N/A;   CLIPPING OF ATRIAL APPENDAGE  04/29/2020    Procedure: CLIPPING OF ATRIAL APPENDAGE USING ATRICURE  PRO2 CLIP SIZE 45MM;  Surgeon: Rexene Alberts, MD;  Location: [facility] OR;  Service: Open Heart Surgery;;   COLONOSCOPY WITH PROPOFOL N/A 09/26/2018   Procedure: COLONOSCOPY WITH PROPOFOL;  Surgeon: Lucilla Lame, MD;  Location: [facility] ENDOSCOPY;  Service: Endoscopy;  Laterality: N/A;   CYSTOSCOPY W/ RETROGRADES Right 08/18/2018   Procedure: CYSTOSCOPY WITH RETROGRADE PYELOGRAM;  Surgeon: Billey Co, MD;  Location: [facility];  Service: Urology;  Laterality: Right;   CYSTOSCOPY/URETEROSCOPY/HOLMIUM LASER/STENT PLACEMENT Right 08/18/2018   Procedure: CYSTOSCOPY/URETEROSCOPY/STENT PLACEMENT;  Surgeon: Billey Co, MD;  Location: [facility];  Service: Urology;  Laterality: Right;   DIAGNOSTIC LAPAROSCOPY     ESOPHAGOGASTRODUODENOSCOPY (EGD) WITH PROPOFOL N/A 09/26/2018   Procedure: ESOPHAGOGASTRODUODENOSCOPY (EGD) WITH PROPOFOL;  Surgeon: Lucilla Lame, MD;  Location: [facility] ENDOSCOPY;  Service: Endoscopy;  Laterality: N/A;   GIVENS CAPSULE STUDY N/A 11/03/2018   Procedure: GIVENS CAPSULE STUDY;  Surgeon: Lucilla Lame, MD;  Location: [facility]COPY;  Service: Endoscopy;  Laterality: N/A;   JOINT REPLACEMENT Left    knee   KNEE ARTHROPLASTY Right 04/09/2019   Procedure: COMPUTER ASSISTED TOTAL KNEE ARTHROPLASTY;  Surgeon: Dereck Leep, MD;  Location: [facility] OR[facility];  Service: Orthopedics;  Laterality: Right;   KNEE CLOSED REDUCTION Left 04/15/2015   Procedure: CLOSED MANIPULATION KNEE;  Surgeon: Thornton Park, MD;  Location: [facility];  Service: Orthopedics;  Laterality: Left;   KNEE SURGERY     MAZE N/A 04/29/2020   Procedure: MAZE;  Surgeon: Rexene Alberts, MD;  Location: [place];  Service: Open Heart Surgery;  Laterality: N/A;   MITRAL VALVE REPLACEMENT N/A 04/29/2020   Procedure: MITRAL VALVE (MV) REPLACEMENT USING ON-X VALVE SIZE 27/29MM;  Surgeon: Rexene Alberts, MD;  Location: [place];  Service: Open Heart Surgery;  Laterality: N/A;   MULTIPLE  EXTRACTIONS WITH ALVEOLOPLASTY N/A 02/28/2020   Procedure: MULTIPLE EXTRACTION WITH ALVEOLOPLASTY;  Surgeon: [name], DMD;  Location: [place];  Service: Dentistry;  Laterality: N/A;   RIGHT/LEFT HEART CATH AND CORONARY ANGIOGRAPHY Bilateral 09/06/2019   Procedure: RIGHT/LEFT HEART CATH AND CORONARY ANGIOGRAPHY;  Surgeon: Minna Merritts, MD;  Location: [place] CV LAB;  Service: Cardiovascular;  Laterality: Bilateral;   TEE WITHOUT CARDIOVERSION N/A 09/13/2017   Procedure: TRANSESOPHAGEAL ECHOCARDIOGRAM (TEE);  Surgeon: Nelva Bush, MD;  Location: [facility];  Service: Cardiovascular;  Laterality: N/A;   TEE WITHOUT CARDIOVERSION N/A 04/01/2020   Procedure: TRANSESOPHAGEAL ECHOCARDIOGRAM (TEE);  Surgeon: [name], MD;  Location[facility] ENDOSCOPY;  Service: Cardiovascular;  Laterality: N/A;   TEE WITHOUT CARDIOVERSION N/A 04/29/2020   Procedure: TRANSESOPHAGEAL ECHOCARDIOGRAM (TEE);  Surgeon: Rexene Alberts, MD;  Location: [place];  Service: Open Heart Surgery;  Laterality: N/A;   TOTAL KNEE ARTHROPLASTY Left 12/25/2014   Procedure: TOTAL KNEE ARTHROPLASTY;  Surgeon: Thornton Park, MD;  Location: [facility];  Service: Orthopedics;  Laterality: Left;   TUBAL LIGATION  Medical History: Past Medical History:  Diagnosis Date   (HFpEF) heart failure with preserved ejection fraction ([place])    a. 08/2017 Echo: EF 55-60%.  Grade 2 diastolic dysfunction.  Moderate mitral stenosis.   Allergy    Anemia    Anxiety    BRCA negative 03/22/2013   Bronchitis 02/19/2016   ON LEVAQUIN PO   Chest tightness    CHF (congestive heart failure) (HCC)    Cigarette smoker 09/11/2017   8-10 day   Dyspnea    Dysrhythmia    GERD (gastroesophageal reflux disease)    Heart murmur    Per pt. dx by Dr. Roxy Manns via stethescope   History of kidney stones    Hyperlipidemia    Hypertension    Interstitial lung disease ([place])    a. CT 2013 b. 02/2018 CXR noted recurrent intersistial changes ILD vs  chronic bronchitis   Moderate mitral stenosis    a.  08/2017 TEE: EF 60 to 65%.  Moderate mitral stenosis.  Mean gradient 14 mmHg.  Valve area 2.59 cm by planimetry, 2.72 cm by pressure half-time.   Persistent atrial fibrillation ([place])    a. 08/2017 s/p TEE/DCCV; b. CHA2DS2VASc = 2-->warfarin.   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 04/29/2020   27/29 mm Onyx-Valve    Family History: Family History  Problem Relation Age of Onset   Cancer Mother 59       breast   Hypertension Mother    Cancer Maternal Aunt        breast   Cancer Maternal Grandmother        breast   Osteoarthritis Father    Hypertension Father     Social History   Socioeconomic History   Marital status: Married    Spouse name: Not on file   Number of children: Not on file   Years of education: Not on file   Highest education level: Not on file  Occupational History   Not on file  Tobacco Use   Smoking status: Former    Packs/day: 0.50    Years: 20.00    Pack years: 10.00    Types: Cigarettes    Quit date: 01/2017    Years since quitting: 3.5   Smokeless tobacco: Never  Vaping Use   Vaping Use: Never used  Substance and Sexual Activity   Alcohol use: Yes    Comment: rarely   Drug use: No   Sexual activity: Not on file  Other Topics Concern   Not on file  Social History Narrative   Not on file   Social Determinants of Health   Financial Resource Strain: Low Risk    Difficulty of Paying Living Expenses: Not very hard  Food Insecurity: No Food Insecurity   Worried About Charity fundraiser in the Last Year: Never true   Ran Out of Food in the Last Year: Never true  Transportation Needs: No Transportation Needs   Lack of Transportation (Medical): No   Lack of Transportation  (Non-Medical): No  Physical Activity: Not on file  Stress: Not on file  Social Connections: Not on file  Intimate Partner Violence: Not on file      Review of Systems  Constitutional:  Negative for chills, fatigue and unexpected weight change.  HENT:  Positive for  postnasal drip. Negative for congestion, rhinorrhea, sneezing and sore throat.   Eyes:  Negative for redness.  Respiratory:  Negative for cough, chest tightness and shortness of breath.   Cardiovascular:  Negative for chest pain and palpitations.  Gastrointestinal:  Positive for abdominal pain. Negative for constipation, diarrhea, nausea and vomiting.  Genitourinary:  Positive for dysuria, frequency and urgency. Negative for hematuria.  Musculoskeletal:  Negative for arthralgias, back pain, joint swelling and neck pain.  Skin:  Negative for rash.  Neurological: Negative.  Negative for tremors and numbness.  Hematological:  Negative for adenopathy. Does not bruise/bleed easily.  Psychiatric/Behavioral:  Negative for behavioral problems (Depression), sleep disturbance and suicidal ideas. The patient is not nervous/anxious.    Vital Signs: BP 128/86   Pulse 67   Temp 98.3 F (36.8 C)   Resp 16   Ht '5\' 4"'  (1.626 m)   Wt 231 lb 3.2 oz (104.9 kg)   SpO2 99%   BMI 39.69 kg/m    Physical Exam Vitals and nursing note reviewed.  Constitutional:      General: She is not in acute distress.    Appearance: She is well-developed. She is obese. She is not diaphoretic.  HENT:     Head: Normocephalic and atraumatic.     Mouth/Throat:     Pharynx: No oropharyngeal exudate.  Eyes:     Pupils: Pupils are equal, round, and reactive to light.  Neck:     Thyroid: No thyromegaly.     Vascular: No JVD.     Trachea: No tracheal deviation.  Cardiovascular:     Rate and Rhythm: Normal rate and regular rhythm.     Heart sounds: Normal heart sounds. No murmur heard.   No friction rub. No gallop.  Pulmonary:     Effort: Pulmonary  effort is normal. No respiratory distress.     Breath sounds: No wheezing or rales.  Chest:     Chest wall: No tenderness.  Abdominal:     General: Bowel sounds are normal.     Palpations: Abdomen is soft.     Tenderness: There is no right CVA tenderness or left CVA tenderness.  Musculoskeletal:        General: Normal range of motion.     Cervical back: Normal range of motion and neck supple.  Lymphadenopathy:     Cervical: No cervical adenopathy.  Skin:    General: Skin is warm and dry.  Neurological:     Mental Status: She is alert and oriented to person, place, and time.     Cranial Nerves: No cranial nerve deficit.  Psychiatric:        Behavior: Behavior normal.        Thought Content: Thought content normal.        Judgment: Judgment normal.       Assessment/Plan: 1. Benign hypertension Stable, continue current medications and monitor closely. May decrease torsemide upon discussion with cardiology  2. Normocytic anemia Followed by hematology  3. Elevated serum creatinine Will discuss decreasing torsemide and obtain renal US for further eval - US Renal; Future  4. S/P mitral valve replacement with Onyx bileaflet mechanical valve Followed by cardiology  5. Dysuria - POCT urinalysis dipstick negative, however will send for culture and go ahead and treat due to symptoms. Start macrobid and adjust pending C/S - nitrofurantoin, macrocrystal-monohydrate, (MACROBID) 100 MG capsule; Take 1 cap twice per day for 10 days.  Dispense: 20 capsule; Refill: 0 - CULTURE, URINE COMPREHENSIVE  General Counseling: Brecken verbalizes understanding of the findings of todays visit and agrees with plan of treatment. I have discussed any further diagnostic evaluation that may be needed or ordered today. We also reviewed her medications today. she has been encouraged to call the office with any questions or concerns that should arise related to todays visit.    Orders Placed This  Encounter  Procedures   CULTURE, URINE COMPREHENSIVE   US Renal   POCT urinalysis dipstick    Meds ordered this encounter  Medications   nitrofurantoin, macrocrystal-monohydrate, (MACROBID) 100 MG capsule    Sig: Take 1 cap twice per day for 10 days.    Dispense:  20 capsule    Refill:  0    This patient was seen by Drema Dallas, PA-C in collaboration with Dr. Clayborn Bigness as a part of collaborative care agreement.   Total time spent:35 Minutes Time spent includes review of chart, medications, test results, and follow up plan with the patient.      Dr Lavera Guise Internal medicine

## 2020-08-11 ENCOUNTER — Telehealth: Payer: Self-pay

## 2020-08-11 ENCOUNTER — Ambulatory Visit: Payer: 59

## 2020-08-11 NOTE — Telephone Encounter (Signed)
called Leslie [name] and advise her to decrease her torsemide, she is currently taking 2 tablets ('40mg'$  total) BID and should try decreasing to just 1 tablet twice per day. If she begins to have increased swelling or SOB she should let us know and we may need to have her take additional dose PRN. Want to decrease though to help her kidneys while awaiting renal US.  Leslie [name] understood directions on change of dose

## 2020-08-12 ENCOUNTER — Other Ambulatory Visit: Payer: Self-pay | Admitting: Surgical

## 2020-08-13 ENCOUNTER — Ambulatory Visit (INDEPENDENT_AMBULATORY_CARE_PROVIDER_SITE_OTHER): Payer: 59

## 2020-08-13 ENCOUNTER — Ambulatory Visit: Payer: 59

## 2020-08-13 ENCOUNTER — Other Ambulatory Visit: Payer: Self-pay

## 2020-08-13 DIAGNOSIS — Z5181 Encounter for therapeutic drug level monitoring: Secondary | ICD-10-CM

## 2020-08-13 DIAGNOSIS — I050 Rheumatic mitral stenosis: Secondary | ICD-10-CM | POA: Diagnosis not present

## 2020-08-13 DIAGNOSIS — I4891 Unspecified atrial fibrillation: Secondary | ICD-10-CM | POA: Diagnosis not present

## 2020-08-13 LAB — CULTURE, URINE COMPREHENSIVE

## 2020-08-13 LAB — POCT INR: INR: 1.9 — AB (ref 2.0–3.0)

## 2020-08-13 NOTE — Patient Instructions (Signed)
-   take 1 tablet tonight, then  - START NEW dosage of warfarin 1/2 tablet every day EXCEPT 1 TABLET ON [place]. - Recheck in 1 week

## 2020-08-14 ENCOUNTER — Ambulatory Visit: Payer: 59

## 2020-08-18 ENCOUNTER — Ambulatory Visit: Payer: 59

## 2020-08-18 ENCOUNTER — Other Ambulatory Visit: Payer: Self-pay

## 2020-08-18 ENCOUNTER — Other Ambulatory Visit: Payer: Self-pay | Admitting: Internal Medicine

## 2020-08-18 MED ORDER — FE FUMARATE-B12-VIT C-FA-IFC PO CAPS: 1.0000 | ORAL_CAPSULE | Freq: Two times a day (BID) | ORAL | 2 refills | Status: DC

## 2020-08-20 ENCOUNTER — Other Ambulatory Visit: Payer: Self-pay

## 2020-08-20 ENCOUNTER — Ambulatory Visit: Payer: 59

## 2020-08-20 ENCOUNTER — Ambulatory Visit (INDEPENDENT_AMBULATORY_CARE_PROVIDER_SITE_OTHER): Payer: 59

## 2020-08-20 DIAGNOSIS — I050 Rheumatic mitral stenosis: Secondary | ICD-10-CM

## 2020-08-20 DIAGNOSIS — I4891 Unspecified atrial fibrillation: Secondary | ICD-10-CM | POA: Diagnosis not present

## 2020-08-20 DIAGNOSIS — Z5181 Encounter for therapeutic drug level monitoring: Secondary | ICD-10-CM

## 2020-08-20 LAB — POCT INR: INR: 1.8 — AB (ref 2.0–3.0)

## 2020-08-20 NOTE — Patient Instructions (Signed)
-   take extra 1/2 tablet tonight & tomorrow, then  - START NEW dosage of warfarin 1/2 tablet every day EXCEPT 1 TABLET ON MONDAYS, [place] in 1 week   You should receive cardiac clearance to proceed w/ your hand surgery - I will go over instructions on holding your warfarin at your next appt

## 2020-08-21 ENCOUNTER — Ambulatory Visit: Payer: 59

## 2020-08-25 ENCOUNTER — Ambulatory Visit: Payer: 59

## 2020-08-25 ENCOUNTER — Telehealth: Payer: Self-pay | Admitting: Cardiovascular Disease

## 2020-08-25 NOTE — Telephone Encounter (Signed)
Received request from [employer].  Attempted to contact patient .  No ans no vm .

## 2020-08-27 ENCOUNTER — Telehealth: Payer: Self-pay | Admitting: Cardiovascular Disease

## 2020-08-27 ENCOUNTER — Other Ambulatory Visit: Payer: Self-pay

## 2020-08-27 ENCOUNTER — Ambulatory Visit: Payer: 59

## 2020-08-27 ENCOUNTER — Ambulatory Visit (INDEPENDENT_AMBULATORY_CARE_PROVIDER_SITE_OTHER): Payer: 59

## 2020-08-27 DIAGNOSIS — I4891 Unspecified atrial fibrillation: Secondary | ICD-10-CM

## 2020-08-27 DIAGNOSIS — I050 Rheumatic mitral stenosis: Secondary | ICD-10-CM | POA: Diagnosis not present

## 2020-08-27 DIAGNOSIS — Z5181 Encounter for therapeutic drug level monitoring: Secondary | ICD-10-CM

## 2020-08-27 LAB — POCT INR: INR: 2.1 (ref 2.0–3.0)

## 2020-08-27 MED ORDER — ENOXAPARIN SODIUM 100 MG/ML IJ SOSY: 100.0000 mg | PREFILLED_SYRINGE | Freq: Two times a day (BID) | INTRAMUSCULAR | 0 refills | Status: DC

## 2020-08-27 NOTE — Telephone Encounter (Signed)
   Primary Cardiologist: Ida Rogue, MD  Chart reviewed as part of pre-operative protocol coverage. Given past medical history and time since last visit, based on ACC/AHA guidelines, Leslie [name] would be at acceptable risk for the planned procedure without further cardiovascular testing.    I will route this recommendation to the requesting party via Epic fax function and remove from pre-op pool.  Please call with questions.  Jossie Ng. Treva Huyett NP-C    08/27/2020, 4:52 PM [address] Office [phone] Fax [phone]

## 2020-08-27 NOTE — Telephone Encounter (Signed)
Received call from [facility], Coumadin RN in [place] as pt mentioned this upcoming procedure during her INR check today. We had not received clearance request yet. Mandi called [facility] who advised her that they typically advise patients to hold warfarin for 5-7 days without getting cardiac clearance. She advised them this is not acceptable as 5 day warfarin hold will place pt at elevated risk for stroke, especially this pt given her hx of mechanical MVR and afib.   Pt requires bridging with Lovenox in order to hold her warfarin for procedure. Her INR was subtherapeutic today at 2.1 (goal 2.5-3.5). Mandi will call pt to begin Lovenox bridge today with dosing of '100mg'$  BID.

## 2020-08-27 NOTE — Patient Instructions (Addendum)
  Wednesday, 8/10: Inject enoxaparin 100 mg in the fatty abdominal tissue at least 2 inches from the belly button twice a day about 12 hours apart, 8am and 8pm rotate sites. No warfarin.  Thursday, 8/11: Inject enoxaparin in the fatty tissue every 12 hours, 8am and 8pm. No warfarin.  Friday, 8/12: Inject enoxaparin in the fatty tissue every 12 hours, 8am and 8pm. No warfarin.  Saturday, 8/13: Inject enoxaparin in the fatty tissue every 12 hours, 8am and 8pm. No warfarin.  Sunday, 8/14: Inject enoxaparin in the fatty tissue in the morning at 8 am (No PM dose). No warfarin.  Monday, 8/15: Procedure Day - No enoxaparin - Resume warfarin in the evening or as directed by doctor (take an extra half tablet with usual dose for 2 days then resume normal dose).  Tuesday, 8/16: Resume enoxaparin inject in the fatty tissue every 12 hours and take warfarin w/ extra 1/2 tablet  Wednesday, 8/17: Inject enoxaparin in the fatty tissue every 12 hours and take warfarin  Thursday, 8/18: Inject enoxaparin in the fatty tissue every 12 hours and take warfarin  Friday, 8/19: Inject enoxaparin in the fatty tissue every 12 hours and take warfarin  Saturday, 8/20: Inject enoxaparin in the fatty tissue every 12 hours and take warfarin  Sunday, 8/21: Inject enoxaparin in the fatty tissue every 12 hours and take warfarin  Monday, 8/22:: warfarin appt to check INR - AT THE [address] OFFICE AT 1:45 ([address] [place], [phone])

## 2020-08-27 NOTE — Telephone Encounter (Signed)
Spoke w/ pt and went over instructions for holding warfarin and starting Lovenox bridge.  Sent instructions via MyChart message. She is very appreciative and will call back w/ any further questions or concerns.

## 2020-08-27 NOTE — Telephone Encounter (Signed)
   [facility] Pre-operative Risk Assessment    Patient Name: Leslie [name]  DOB: [birth year] MRN: [id number]  HEARTCARE STAFF:  - IMPORTANT!!!!!! Under Visit Info/Reason for Call, type in Other and utilize the format Clearance MM/DD/YY or Clearance TBD. Do not use dashes or single digits. - Please review there is not already an duplicate clearance open for this procedure. - If request is for dental extraction, please clarify the # of teeth to be extracted. - If the patient is currently at the dentist's office, call Pre-Op Callback Staff (MA/nurse) to input urgent request.  - If the patient is not currently in the dentist office, please route to the Pre-Op pool.  Request for surgical clearance:  What type of surgery is being performed? Left Elbow Ulnar nerve release   When is this surgery scheduled? 09-01-20  What type of clearance is required (medical clearance vs. Pharmacy clearance to hold med vs. Both)? Both   Are there any medications that need to be held prior to surgery and how long? Not noted   Practice name and name of physician performing surgery? [facility]   What is the office phone number? [phone]   7.   What is the office fax number? [phone]  8.   Anesthesia type (None, local, MAC, general) ? Not noted   Clarisse Gouge 08/27/2020, 3:52 PM  _________________________________________________________________   (provider comments below)

## 2020-08-28 ENCOUNTER — Ambulatory Visit: Payer: 59

## 2020-08-28 ENCOUNTER — Other Ambulatory Visit: Payer: Self-pay | Admitting: Family

## 2020-08-28 DIAGNOSIS — Z954 Presence of other heart-valve replacement: Secondary | ICD-10-CM

## 2020-08-28 DIAGNOSIS — I050 Rheumatic mitral stenosis: Secondary | ICD-10-CM

## 2020-08-28 DIAGNOSIS — I5032 Chronic diastolic (congestive) heart failure: Secondary | ICD-10-CM

## 2020-08-31 ENCOUNTER — Other Ambulatory Visit: Payer: Self-pay | Admitting: Internal Medicine

## 2020-09-01 ENCOUNTER — Ambulatory Visit: Payer: 59

## 2020-09-01 NOTE — Telephone Encounter (Signed)
Pt need appt fo refills

## 2020-09-03 ENCOUNTER — Ambulatory Visit: Payer: 59

## 2020-09-04 ENCOUNTER — Ambulatory Visit: Payer: 59

## 2020-09-05 ENCOUNTER — Other Ambulatory Visit: Payer: Self-pay

## 2020-09-05 ENCOUNTER — Observation Stay (HOSPITAL_COMMUNITY)
Admission: EM | Admit: 2020-09-05 | Discharge: 2020-09-08 | Disposition: A | Discharge status: Home / Self Care | Payer: 59 | Attending: Family Medicine | Admitting: Family Medicine

## 2020-09-05 ENCOUNTER — Telehealth: Payer: Self-pay | Admitting: *Deleted

## 2020-09-05 ENCOUNTER — Ambulatory Visit (INDEPENDENT_AMBULATORY_CARE_PROVIDER_SITE_OTHER): Payer: 59 | Admitting: *Deleted

## 2020-09-05 ENCOUNTER — Encounter: Payer: Self-pay | Admitting: *Deleted

## 2020-09-05 ENCOUNTER — Encounter (HOSPITAL_COMMUNITY): Payer: Self-pay | Admitting: Emergency Medicine

## 2020-09-05 DIAGNOSIS — I050 Rheumatic mitral stenosis: Secondary | ICD-10-CM

## 2020-09-05 DIAGNOSIS — I480 Paroxysmal atrial fibrillation: Secondary | ICD-10-CM | POA: Diagnosis present

## 2020-09-05 DIAGNOSIS — Z954 Presence of other heart-valve replacement: Secondary | ICD-10-CM

## 2020-09-05 DIAGNOSIS — Z20822 Contact with and (suspected) exposure to covid-19: Secondary | ICD-10-CM | POA: Insufficient documentation

## 2020-09-05 DIAGNOSIS — I4891 Unspecified atrial fibrillation: Secondary | ICD-10-CM

## 2020-09-05 DIAGNOSIS — I509 Heart failure, unspecified: Secondary | ICD-10-CM | POA: Diagnosis not present

## 2020-09-05 DIAGNOSIS — Z87891 Personal history of nicotine dependence: Secondary | ICD-10-CM | POA: Diagnosis not present

## 2020-09-05 DIAGNOSIS — T148XXA Other injury of unspecified body region, initial encounter: Secondary | ICD-10-CM

## 2020-09-05 DIAGNOSIS — N183 Chronic kidney disease, stage 3 unspecified: Secondary | ICD-10-CM | POA: Diagnosis not present

## 2020-09-05 DIAGNOSIS — D500 Iron deficiency anemia secondary to blood loss (chronic): Secondary | ICD-10-CM | POA: Diagnosis not present

## 2020-09-05 DIAGNOSIS — Z5181 Encounter for therapeutic drug level monitoring: Secondary | ICD-10-CM

## 2020-09-05 DIAGNOSIS — R791 Abnormal coagulation profile: Secondary | ICD-10-CM

## 2020-09-05 DIAGNOSIS — R58 Hemorrhage, not elsewhere classified: Secondary | ICD-10-CM | POA: Diagnosis present

## 2020-09-05 DIAGNOSIS — Z79899 Other long term (current) drug therapy: Secondary | ICD-10-CM | POA: Diagnosis not present

## 2020-09-05 DIAGNOSIS — I5032 Chronic diastolic (congestive) heart failure: Secondary | ICD-10-CM | POA: Diagnosis present

## 2020-09-05 DIAGNOSIS — I5030 Unspecified diastolic (congestive) heart failure: Secondary | ICD-10-CM | POA: Diagnosis present

## 2020-09-05 DIAGNOSIS — L7622 Postprocedural hemorrhage and hematoma of skin and subcutaneous tissue following other procedure: Secondary | ICD-10-CM | POA: Diagnosis present

## 2020-09-05 LAB — POCT INR: INR: 1.3 — AB (ref 2.0–3.0)

## 2020-09-05 MED ORDER — OXYCODONE-ACETAMINOPHEN 5-325 MG PO TABS
1.0000 | ORAL_TABLET | Freq: Once | ORAL | Status: AC
  Administered 2020-09-05: 1 via ORAL
  Filled 2020-09-05: qty 1

## 2020-09-05 MED ORDER — ENOXAPARIN SODIUM 100 MG/ML IJ SOSY: 100.0000 mg | PREFILLED_SYRINGE | Freq: Two times a day (BID) | INTRAMUSCULAR | 0 refills | Status: DC

## 2020-09-05 MED ORDER — TRANEXAMIC ACID FOR EPISTAXIS
500.0000 mg | Freq: Once | TOPICAL | Status: AC
  Administered 2020-09-05: 500 mg via TOPICAL
  Filled 2020-09-05 (×2): qty 10

## 2020-09-05 NOTE — Progress Notes (Deleted)
Phone note from today 8/19 Conversation: Medication Management (Newest Message First) September 05, 2020 Me    9:42 AM Note Pt is on Lovenox. Pt had procedure on Monday and she stated that she keeps bleeding at the site. Pt stated she is on her way now to [facility] to see the Dr. Abbott Pao stated that her INR was 1.8 on Monday (8/15) at the surgical center. Scheduled pt to come in today after she sees ortho to have INR checked.

## 2020-09-05 NOTE — ED Provider Notes (Signed)
Emergency Medicine Provider Triage Evaluation Note  Leslie [name] , a 55 y.o. female  was evaluated in triage.  Pt had an ulnar nerve surgery on 8/15 and complains of excessive bleeding from her surgical site. Had heart surgery last month, on lovenox and restarted warfarin today. 1.3 INR today. Surgical rewrapped by ortho earlier today  Review of Systems  Positive: Dizziness, SOB, palpitations Negative: CP, syncope, pain  Physical Exam  BP (!) 176/93 (BP Location: Right Arm)   Pulse 96   Temp 98.5 F (36.9 C)   Resp 18   Ht '5\' 4"'$  (1.626 m)   Wt 104.8 kg   SpO2 96%   BMI 39.65 kg/m  Gen:   Awake, no distress   Resp:  Normal effort, CTA Cardio:  RRR MSK:   Moves extremities without difficulty Other:  Bleeding noted to split wrapping  Medical Decision Making  Medically screening exam initiated at 9:54 PM.  Appropriate orders placed.  Talin L Bos was informed that the remainder of the evaluation will be completed by another provider, this initial triage assessment does not replace that evaluation, and the importance of remaining in the ED until their evaluation is complete.     Darliss Ridgel 09/05/20 2210    Fredia Sorrow, MD 09/10/20 2001

## 2020-09-05 NOTE — ED Triage Notes (Addendum)
Patient presents after having an ulnar nerve removal for bleeding. Patient states she has followed up and they, "just kept putting stuff on it." Patient noted to have frank bleeding at incision site. Full dressing not removed due to splint. Gauze and coban applied for pressure dressing. Patient is on blood thinners due to afib and valve issues.

## 2020-09-05 NOTE — ED Provider Notes (Signed)
[facility] DEPT Provider Note   CSN: [id number] Arrival date & time: 09/05/20  2104     History Chief Complaint  Patient presents with   Post-op Problem   Bleeding/Bruising    Leslie [name] is a 55 y.o. female.  Patient with a history of mitral valve replacement, Maze procedure, atrial fibrillation, Coumadin use presenting with bleeding from her surgical wound.  She had surgery on her ulnar nerve for "bleeding around the nerve" on August 15 by Dr. Caralyn Guile. since then she has had constant bleeding from the proximal portion of the wound.  States her dressing has been changed on a daily basis.  She has been seeing Dr. Lequita Asal office x3 including today for recurrent bleeding around the wound.  Her Coumadin was stopped 5 days prior to the procedure.  She has been on Lovenox bridge.  She restarted Coumadin this evening.  Today Dr. Apolonio Schneiders told her to keep the wound wrapped and follow-up with her Coumadin clinic.  She comes in tonight because of persistent bleeding from the arm.  Denies any new pain or trauma.  There is no paresthesias.  There is no dizziness or lightheadedness.  She has no chest pain or shortness of breath.  States compliance with her Lovenox and Coumadin. Reports that the bleeding has been excessive ever since she had the surgery 4 days ago.  The history is provided by the patient.      Past Medical History:  Diagnosis Date   (HFpEF) heart failure with preserved ejection fraction ([place])    a. 08/2017 Echo: EF 55-60%.  Grade 2 diastolic dysfunction.  Moderate mitral stenosis.   Allergy    Anemia    Anxiety    BRCA negative 03/22/2013   Bronchitis 02/19/2016   ON LEVAQUIN PO   Chest tightness    CHF (congestive heart failure) (HCC)    Cigarette smoker 09/11/2017   8-10 day   Dyspnea    Dysrhythmia    GERD (gastroesophageal reflux disease)    Heart murmur    Per pt. dx by Dr. Roxy Manns via stethescope   History of kidney stones     Hyperlipidemia    Hypertension    Interstitial lung disease ([place])    a. CT 2013 b. 02/2018 CXR noted recurrent intersistial changes ILD vs chronic bronchitis   Moderate mitral stenosis    a.  08/2017 TEE: EF 60 to 65%.  Moderate mitral stenosis.  Mean gradient 14 mmHg.  Valve area 2.59 cm by planimetry, 2.72 cm by pressure half-time.   Persistent atrial fibrillation ([place])    a. 08/2017 s/p TEE/DCCV; b. CHA2DS2VASc = 2-->warfarin.   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 04/29/2020   27/29 mm Onyx-Valve    Patient Active Problem List   Diagnosis Date Noted   S/P mitral valve replacement with Onyx bileaflet mechanical valve 04/29/2020   S/P Maze operation for atrial fibrillation 04/29/2020   S/P MVR (mitral valve replacement) 04/29/2020   BMI 39.0-39.9,adult 09/30/2019   Total knee replacement status 04/09/2019   Right knee pain 11/27/2018   GERD (gastroesophageal reflux disease) 10/25/2018   CKD (chronic kidney disease), stage III ([place]) 10/25/2018   CHF (congestive heart failure) ([place]) 10/25/2018   Normocytic anemia 10/01/2018   Iron deficiency anemia secondary to blood loss (chronic)  Polyp of descending colon    (HFpEF) heart failure with preserved ejection fraction (HCC)    Renal calculus, right 07/03/2018   Low back pain 06/27/2018   Pedal edema 06/27/2018   Abnormal renal function 02/16/2018   Acute recurrent pansinusitis 01/13/2018   Anxiety 01/13/2018   Osteoarthritis of knee 01/09/2018   Morbid obesity with body mass index (BMI) of 40.0 or higher ([place]) 11/13/2017   Major depressive disorder 09/22/2017   Mitral valve stenosis    Atrial fibrillation ([place]) 09/11/2017   Hypertension 06/06/2017   Tobacco dependency 06/06/2017   Acute upper respiratory  infection 04/28/2017   Pain of left lower extremity 02/11/2016   S/P total knee replacement using cement 12/25/2014   Fibrocystic breast disease 03/23/2013    Past Surgical History:  Procedure Laterality Date   ABDOMINAL HYSTERECTOMY     total   ABDOMINAL HYSTERECTOMY     BREAST BIOPSY Bilateral 2012   BREAST BIOPSY Right 08-07-12   fibroadenomatous changes and columnar cells   BREAST BIOPSY  02/03/2015   stereo byrnett   BUBBLE STUDY  04/01/2020   Procedure: BUBBLE STUDY;  Surgeon: [name], MD;  Location: [place];  Service: Cardiovascular;;   CARDIAC CATHETERIZATION     CHOLECYSTECTOMY N/A 02/27/2016   Procedure: LAPAROSCOPIC CHOLECYSTECTOMY;  Surgeon: Christene Lye, MD;  Location: [facility];  Service: General;  Laterality: N/A;   CLIPPING OF ATRIAL APPENDAGE  04/29/2020   Procedure: CLIPPING OF ATRIAL APPENDAGE USING ATRICURE  PRO2 CLIP SIZE 45MM;  Surgeon: Rexene Alberts, MD;  Location: [facility] OR;  Service: Open Heart Surgery;;   COLONOSCOPY WITH PROPOFOL N/A 09/26/2018   Procedure: COLONOSCOPY WITH PROPOFOL;  Surgeon: Lucilla Lame, MD;  Location: [facility] ENDOSCOPY;  Service: Endoscopy;  Laterality: N/A;   CYSTOSCOPY W/ RETROGRADES Right 08/18/2018   Procedure: CYSTOSCOPY WITH RETROGRADE PYELOGRAM;  Surgeon: Billey Co, MD;  Location: [facility];  Service: Urology;  Laterality: Right;   CYSTOSCOPY/URETEROSCOPY/HOLMIUM LASER/STENT PLACEMENT Right 08/18/2018   Procedure: CYSTOSCOPY/URETEROSCOPY/STENT PLACEMENT;  Surgeon: Billey Co, MD;  Location: [facility];  Service: Urology;  Laterality: Right;   DIAGNOSTIC LAPAROSCOPY     ESOPHAGOGASTRODUODENOSCOPY (EGD) WITH PROPOFOL N/A 09/26/2018   Procedure: ESOPHAGOGASTRODUODENOSCOPY (EGD) WITH PROPOFOL;  Surgeon: Lucilla Lame, MD;  Location: [facility]OSCOPY;  Service: Endoscopy;  Laterality: N/A;   GIVENS CAPSULE STUDY N/A 11/03/2018   Procedure: GIVENS CAPSULE STUDY;  Surgeon: Lucilla Lame, MD;  Location: [facility] ENDOSCOPY;   Service: Endoscopy;  Laterality: N/A;   JOINT REPLACEMENT Left    knee   KNEE ARTHROPLASTY Right 04/09/2019   Procedure: COMPUTER ASSISTED TOTAL KNEE ARTHROPLASTY;  Surgeon: Dereck Leep, MD;  Location: [facility];  Service: Orthopedics;  Laterality: Right;   KNEE CLOSED REDUCTION Left 04/15/2015   Procedure: CLOSED MANIPULATION KNEE;  Surgeon: Thornton Park, MD;  Location: [facility];  Service: Orthopedics;  Laterality: Left;   KNEE SURGERY     MAZE N/A 04/29/2020   Procedure: MAZE;  Surgeon: Rexene Alberts, MD;  Location: [place];  Service: Open Heart Surgery;  Laterality: N/A;   MITRAL VALVE REPLACEMENT N/A 04/29/2020   Procedure: MITRAL VALVE (MV) REPLACEMENT USING ON-X VALVE SIZE 27/29MM;  Surgeon: Rexene Alberts, MD;  Location: [place];  Service: Open Heart Surgery;  Laterality: N/A;   MULTIPLE EXTRACTIONS WITH ALVEOLOPLASTY N/A 02/28/2020   Procedure: MULTIPLE EXTRACTION WITH ALVEOLOPLASTY;  Surgeon: [name], DMD;  Location: [place];  Service: Dentistry;  Laterality: N/A;   RIGHT/LEFT HEART CATH AND  CORONARY ANGIOGRAPHY Bilateral 09/06/2019   Procedure: RIGHT/LEFT HEART CATH AND CORONARY ANGIOGRAPHY;  Surgeon: Minna Merritts, MD;  Location: [facility];  Service: Cardiovascular;  Laterality: Bilateral;   TEE WITHOUT CARDIOVERSION N/A 09/13/2017   Procedure: TRANSESOPHAGEAL ECHOCARDIOGRAM (TEE);  Surgeon: Nelva Bush, MD;  Location: [facility];  Service: Cardiovascular;  Laterality: N/A;   TEE WITHOUT CARDIOVERSION N/A 04/01/2020   Procedure: TRANSESOPHAGEAL ECHOCARDIOGRAM (TEE);  Surgeon: [name], MD;  Location: [facility] ENDOSCOPY;  Service: Cardiovascular;  Laterality: N/A;   TEE WITHOUT CARDIOVERSION N/A 04/29/2020   Procedure: TRANSESOPHAGEAL ECHOCARDIOGRAM (TEE);  Surgeon: Rexene Alberts, MD;  Location: [place];  Service: Open Heart Surgery;  Laterality: N/A;   TOTAL KNEE ARTHROPLASTY Left 12/25/2014   Procedure: TOTAL KNEE ARTHROPLASTY;  Surgeon: Thornton Park,  MD;  Location: [facility];  Service: Orthopedics;  Laterality: Left;   TUBAL LIGATION       OB History     Gravida  3   Para  3   Term      Preterm      AB      Living  3      SAB      IAB      Ectopic      Multiple      Live Births           Obstetric Comments  1st Menstrual Cycle:  13 1st Pregnancy:  29         Family History  Problem Relation Age of Onset   Cancer Mother 70       breast   Hypertension Mother    Cancer Maternal Aunt        breast   Cancer Maternal Grandmother        breast   Osteoarthritis Father    Hypertension Father     Social History   Tobacco Use   Smoking status: Former    Packs/day: 0.50    Years: 20.00    Pack years: 10.00    Types: Cigarettes    Quit date: 01/2017    Years since quitting: 3.6   Smokeless tobacco: Never  Vaping Use   Vaping Use: Never used  Substance Use Topics   Alcohol use: Yes    Comment: rarely   Drug use: No    Home Medications Prior to Admission medications   Medication Sig Start Date End Date Taking? Authorizing Provider  acetaminophen (TYLENOL) 500 MG tablet Take 1,000 mg by mouth every 6 (six) hours as needed for moderate pain.    [provider]  albuterol (VENTOLIN HFA) 108 (90 Base) MCG/ACT inhaler Inhale 2 puffs into the lungs every 6 (six) hours as needed for wheezing or shortness of breath. 01/25/20   Luiz Ochoa, NP  ALPRAZolam Duanne Moron) 0.5 MG tablet Take 0.5 tablets (0.25 mg total) by mouth at bedtime as needed for anxiety. 07/04/20   Lavera Guise, MD  amLODipine (NORVASC) 10 MG tablet Take 1 tablet (10 mg total) by mouth daily. 06/02/20   Loel Dubonnet, NP  atorvastatin (LIPITOR) 80 MG tablet TAKE 1 TABLET BY MOUTH EVERY DAY AT 6 PM 06/04/20   Loel Dubonnet, NP  calcium carbonate (TUMS - DOSED IN MG ELEMENTAL CALCIUM) 500 MG chewable tablet Chew 500 mg by mouth daily as needed for indigestion or heartburn.    [provider]  enoxaparin (LOVENOX) 100  MG/ML injection Inject 1 mL (100 mg total) into the skin every 12 (  twelve) hours. 09/05/20   Minna Merritts, MD  ferrous [id number] C-folic acid (TRINSICON / FOLTRIN) capsule Take 1 capsule by mouth 2 (two) times daily after a meal. 08/18/20   Lavera Guise, MD  fluconazole (DIFLUCAN) 150 MG tablet Take 1 tab po once and may repeat in 3 days if symptoms Persist 08/04/20   Lavera Guise, MD  fluticasone [facility]) 50 MCG/ACT nasal spray Place 1 spray into both nostrils daily as needed for rhinitis.    [provider]  metoprolol succinate (TOPROL-XL) 50 MG 24 hr tablet Take 1 tablet (50 mg) by mouth in the morning & take 2 tablets (100 mg) by mouth in the evening. Take with or immediately following a meal. 07/07/20   Gollan, Kathlene November, MD  nitrofurantoin, macrocrystal-monohydrate, (MACROBID) 100 MG capsule Take 1 cap twice per day for 10 days. 08/08/20   McDonough, Si Gaul, PA-C  omeprazole (PRILOSEC) 40 MG capsule Take 1 capsule (40 mg total) by mouth daily. 07/15/20   Lavera Guise, MD  omeprazole (PRILOSEC) 40 MG capsule Take by mouth. Patient not taking: Reported on 08/08/2020 01/18/18   [provider]  potassium chloride SA (KLOR-CON) 20 MEQ tablet Take 1 tablet (20 mEq total) by mouth 2 (two) times daily. 06/04/20 12/01/20  Loel Dubonnet, NP  spironolactone (ALDACTONE) 25 MG tablet Take 0.5 tablets (12.5 mg total) by mouth daily. 06/02/20   Loel Dubonnet, NP  torsemide (DEMADEX) 20 MG tablet TAKE 2 TABLETS(40 MG) BY MOUTH TWICE DAILY 08/28/20   Loel Dubonnet, NP  warfarin (COUMADIN) 2 MG tablet TAKE 1 TABLET BY MOUTH DAILY AT 4PM AS INSTRUCTED BY COUMADIN CLINIC FOR LIFETIME 07/17/20   End, Harrell Gave, MD    Allergies    Morphine, Oxycodone hcl, and Dilaudid [hydromorphone hcl]  Review of Systems   Review of Systems  Constitutional:  Negative for activity change, appetite change and fever.  HENT:  Negative for congestion and rhinorrhea.   Respiratory:   Negative for chest tightness and shortness of breath.   Cardiovascular:  Negative for chest pain.  Gastrointestinal:  Negative for abdominal pain, nausea and vomiting.  Genitourinary:  Negative for dysuria and hematuria.  Skin:  Positive for wound.  Neurological:  Negative for dizziness, weakness, light-headedness and headaches.   all other systems are negative except as noted in the HPI and PMH.   Physical Exam Updated Vital Signs BP (!) 176/93 (BP Location: Right Arm)   Pulse 96   Temp 98.5 F (36.9 C)   Resp 18   Ht '5\' 4"'  (1.626 m)   Wt 104.8 kg   SpO2 96%   BMI 39.65 kg/m   Physical Exam Vitals and nursing note reviewed.  Constitutional:      General: She is not in acute distress.    Appearance: She is well-developed.  HENT:     Head: Normocephalic and atraumatic.     Mouth/Throat:     Pharynx: No oropharyngeal exudate.  Eyes:     Conjunctiva/sclera: Conjunctivae normal.     Pupils: Pupils are equal, round, and reactive to light.  Neck:     Comments: No meningismus. Cardiovascular:     Rate and Rhythm: Normal rate and regular rhythm.     Heart sounds: Murmur heard.  Pulmonary:     Effort: Pulmonary effort is normal. No respiratory distress.     Breath sounds: Normal breath sounds.  Abdominal:     Palpations: Abdomen is soft.     Tenderness:  There is no abdominal tenderness. There is no guarding or rebound.  Musculoskeletal:        General: Tenderness present. Normal range of motion.     Cervical back: Normal range of motion and neck supple.     Comments: Blood saturated her surgical dressing and splint. This was taken down fully.  Surgical incision to left medial elbow is clean and intact.  Sutures are in place.  There is some oozing blood from the proximal portion of the wound.  No pulsatile bleeding. Intact radial pulse and cardinal hand movements  Skin:    General: Skin is warm.  Neurological:     Mental Status: She is alert and oriented to person, place,  and time.     Cranial Nerves: No cranial nerve deficit.     Motor: No abnormal muscle tone.     Coordination: Coordination normal.     Comments:  5/5 strength throughout. CN 2-12 intact.Equal grip strength.   Psychiatric:        Behavior: Behavior normal.    ED Results / Procedures / Treatments   Labs (all labs ordered are listed, but only abnormal results are displayed) Labs Reviewed  CBC WITH DIFFERENTIAL/PLATELET - Abnormal; Notable for the following components:      Result Value   RBC 3.01 (*)    Hemoglobin 8.8 (*)    HCT 27.4 (*)    All other components within normal limits  BASIC METABOLIC PANEL - Abnormal; Notable for the following components:   Potassium 3.2 (*)    Chloride 97 (*)    Glucose, Bld 126 (*)    Creatinine, Ser 1.14 (*)    GFR, Estimated 57 (*)    All other components within normal limits  CBC - Abnormal; Notable for the following components:   RBC 2.63 (*)    Hemoglobin 7.7 (*)    HCT 23.9 (*)    All other components within normal limits  HEPARIN LEVEL (UNFRACTIONATED) - Abnormal; Notable for the following components:   Heparin Unfractionated 0.23 (*)    All other components within normal limits  CBC - Abnormal; Notable for the following components:   RBC 2.69 (*)    Hemoglobin 8.1 (*)    HCT 24.8 (*)    All other components within normal limits  COMPREHENSIVE METABOLIC PANEL - Abnormal; Notable for the following components:   Potassium 3.3 (*)    Chloride 97 (*)    Glucose, Bld 123 (*)    Creatinine, Ser 1.22 (*)    Calcium 8.8 (*)    GFR, Estimated 53 (*)    All other components within normal limits  RESP PANEL BY RT-PCR (FLU A&B, COVID) ARPGX2  PROTIME-INR  APTT  MAGNESIUM  PROTIME-INR  HIV ANTIBODY (ROUTINE TESTING W REFLEX)  TYPE AND SCREEN    EKG EKG Interpretation  Date/Time:  Friday September 05 2020 22:38:41 EDT Ventricular Rate:  86 PR Interval:  184 QRS Duration: 90 QT Interval:  384 QTC Calculation: 459 R Axis:   75 Text  Interpretation: Normal sinus rhythm Normal ECG No significant change was found Confirmed by Ezequiel Essex [phone]) on 09/06/2020 12:52:17 AM  Radiology No results found.  Procedures .Critical Care  Date/Time: 09/06/2020 9:06 AM Performed by: Ezequiel Essex, MD Authorized by: Ezequiel Essex, MD   Critical care provider statement:    Critical care time (minutes):  45   Critical care was necessary to treat or prevent imminent or life-threatening deterioration of the following conditions: anticoagulation, active  bleeding.   Critical care was time spent personally by me on the following activities:  Discussions with consultants, evaluation of patient's response to treatment, examination of patient, ordering and performing treatments and interventions, ordering and review of laboratory studies, ordering and review of radiographic studies, pulse oximetry, re-evaluation of patient's condition, obtaining history from patient or surrogate and review of old charts   Medications Ordered in ED Medications  tranexamic acid (CYKLOKAPRON) 1000 MG/10ML topical solution 500 mg (has no administration in time range)  oxyCODONE-acetaminophen (PERCOCET/ROXICET) 5-325 MG per tablet 1 tablet (has no administration in time range)    ED Course  I have reviewed the triage vital signs and the nursing notes.  Pertinent labs & imaging results that were available during my care of the patient were reviewed by me and considered in my medical decision making (see chart for details).    MDM Rules/Calculators/A&P                          Bleeding from surgical site after surgery.  She is on Coumadin and Lovenox for mechanical heart valve.  Vitals are stable.  Some oozing from the proximal portion of the wound with no pulsatile bleeding.  Topical TXA is placed  After TXA placement, bleeding has subsided.  The patient monitored in the ED with no recurrence.  Her wound is wrapped and splint replaced.  Hemoglobin  8.8 which is slightly decreased from her preop hemoglobin in the 10 range.  INR is 1.1.  Patient states her Coumadin was restarted today and she is still taking Lovenox shots. Her INR goal is 2.5-3.5  Discussed with Dr. Conley Canal of cardiology.  He feels with patient's drop in hemoglobin, low INR and recurrent bleeding she would benefit from hospital observation.  Recommends heparin with transition to Lovenox tomorrow if remains stable.  Patient in agreement. D/w Dr. Marlowe Sax who requests attempting to contact Dr. Angus Palms team to ensure heparin gtt is acceptable.   D/w Dr. Lyla Glassing on call for Dr. Caralyn Guile. He agrees with heparin which is necessary for patient's mechanical heart valve. Recommends changing bandages prn and observation.   Dr. Marlowe Sax updated. Vitals remain stable in the ED. Final Clinical Impression(s) / ED Diagnoses Final diagnoses:  Bleeding from wound  Subtherapeutic international normalized ratio (INR)    Rx / DC Orders ED Discharge Orders     None        Yuritzi Kamp, Annie Main, MD 09/06/20 [phone]

## 2020-09-05 NOTE — Patient Instructions (Addendum)
Description    Continue lovenox injections twice a day. Take 1.5 tablets of warfarin today and 1 tablet tomorrow, then continue to take warfarin 1/2 a tablet daily excpet for 1 tablet on Monday, Wednesday and Fridays. Recheck INR on Wednesday 8/24.

## 2020-09-05 NOTE — Telephone Encounter (Signed)
This encounter was created in error - please disregard.

## 2020-09-05 NOTE — Telephone Encounter (Addendum)
Pt is on Lovenox. Pt had procedure on Monday and she stated that she keeps bleeding at the site. Pt stated she is on her way now to [facility] to see the Dr. Abbott Pao stated that her INR was 1.8 on Monday (8/15) at the surgical center. Scheduled pt to come in today after she sees ortho to have INR checked.

## 2020-09-06 ENCOUNTER — Encounter (HOSPITAL_COMMUNITY): Payer: Self-pay | Admitting: Internal Medicine

## 2020-09-06 DIAGNOSIS — D500 Iron deficiency anemia secondary to blood loss (chronic): Secondary | ICD-10-CM | POA: Diagnosis not present

## 2020-09-06 LAB — CBC WITH DIFFERENTIAL/PLATELET
Abs Immature Granulocytes: 0.04 10*3/uL (ref 0.00–0.07)
Basophils Absolute: 0 10*3/uL (ref 0.0–0.1)
Basophils Relative: 0 %
Eosinophils Absolute: 0.1 10*3/uL (ref 0.0–0.5)
Eosinophils Relative: 1 %
HCT: 27.4 % — ABNORMAL LOW (ref 36.0–46.0)
Hemoglobin: 8.8 g/dL — ABNORMAL LOW (ref 12.0–15.0)
Immature Granulocytes: 0 %
Lymphocytes Relative: 24 %
Lymphs Abs: 2.3 10*3/uL (ref 0.7–4.0)
MCH: 29.2 pg (ref 26.0–34.0)
MCHC: 32.1 g/dL (ref 30.0–36.0)
MCV: 91 fL (ref 80.0–100.0)
Monocytes Absolute: 0.8 10*3/uL (ref 0.1–1.0)
Monocytes Relative: 9 %
Neutro Abs: 6.4 10*3/uL (ref 1.7–7.7)
Neutrophils Relative %: 66 %
Platelets: 340 10*3/uL (ref 150–400)
RBC: 3.01 MIL/uL — ABNORMAL LOW (ref 3.87–5.11)
RDW: 13.2 % (ref 11.5–15.5)
WBC: 9.7 10*3/uL (ref 4.0–10.5)
nRBC: 0.2 % (ref 0.0–0.2)

## 2020-09-06 LAB — CBC
HCT: 23.9 % — ABNORMAL LOW (ref 36.0–46.0)
HCT: 24.8 % — ABNORMAL LOW (ref 36.0–46.0)
Hemoglobin: 7.7 g/dL — ABNORMAL LOW (ref 12.0–15.0)
Hemoglobin: 8.1 g/dL — ABNORMAL LOW (ref 12.0–15.0)
MCH: 29.3 pg (ref 26.0–34.0)
MCH: 30.1 pg (ref 26.0–34.0)
MCHC: 32.2 g/dL (ref 30.0–36.0)
MCHC: 32.7 g/dL (ref 30.0–36.0)
MCV: 90.9 fL (ref 80.0–100.0)
MCV: 92.2 fL (ref 80.0–100.0)
Platelets: 303 10*3/uL (ref 150–400)
Platelets: 328 10*3/uL (ref 150–400)
RBC: 2.63 MIL/uL — ABNORMAL LOW (ref 3.87–5.11)
RBC: 2.69 MIL/uL — ABNORMAL LOW (ref 3.87–5.11)
RDW: 13.2 % (ref 11.5–15.5)
RDW: 13.2 % (ref 11.5–15.5)
WBC: 9.3 10*3/uL (ref 4.0–10.5)
WBC: 9.3 10*3/uL (ref 4.0–10.5)
nRBC: 0 % (ref 0.0–0.2)
nRBC: 0.2 % (ref 0.0–0.2)

## 2020-09-06 LAB — COMPREHENSIVE METABOLIC PANEL
ALT: 32 U/L (ref 0–44)
AST: 25 U/L (ref 15–41)
Albumin: 3.8 g/dL (ref 3.5–5.0)
Alkaline Phosphatase: 116 U/L (ref 38–126)
Anion gap: 11 (ref 5–15)
BUN: 19 mg/dL (ref 6–20)
CO2: 28 mmol/L (ref 22–32)
Calcium: 8.8 mg/dL — ABNORMAL LOW (ref 8.9–10.3)
Chloride: 97 mmol/L — ABNORMAL LOW (ref 98–111)
Creatinine, Ser: 1.22 mg/dL — ABNORMAL HIGH (ref 0.44–1.00)
GFR, Estimated: 53 mL/min — ABNORMAL LOW (ref >60)
Glucose, Bld: 123 mg/dL — ABNORMAL HIGH (ref 70–99)
Potassium: 3.3 mmol/L — ABNORMAL LOW (ref 3.5–5.1)
Sodium: 136 mmol/L (ref 135–145)
Total Bilirubin: 0.5 mg/dL (ref 0.3–1.2)
Total Protein: 7.8 g/dL (ref 6.5–8.1)

## 2020-09-06 LAB — RESP PANEL BY RT-PCR (FLU A&B, COVID) ARPGX2
Influenza A by PCR: NEGATIVE
Influenza B by PCR: NEGATIVE
SARS Coronavirus 2 by RT PCR: NEGATIVE

## 2020-09-06 LAB — PROTIME-INR
INR: 1.1 (ref 0.8–1.2)
INR: 1.2 (ref 0.8–1.2)
Prothrombin Time: 14.6 seconds (ref 11.4–15.2)
Prothrombin Time: 14.9 seconds (ref 11.4–15.2)

## 2020-09-06 LAB — BASIC METABOLIC PANEL
Anion gap: 10 (ref 5–15)
BUN: 19 mg/dL (ref 6–20)
CO2: 28 mmol/L (ref 22–32)
Calcium: 9 mg/dL (ref 8.9–10.3)
Chloride: 97 mmol/L — ABNORMAL LOW (ref 98–111)
Creatinine, Ser: 1.14 mg/dL — ABNORMAL HIGH (ref 0.44–1.00)
GFR, Estimated: 57 mL/min — ABNORMAL LOW (ref >60)
Glucose, Bld: 126 mg/dL — ABNORMAL HIGH (ref 70–99)
Potassium: 3.2 mmol/L — ABNORMAL LOW (ref 3.5–5.1)
Sodium: 135 mmol/L (ref 135–145)

## 2020-09-06 LAB — MAGNESIUM: Magnesium: 2.1 mg/dL (ref 1.7–2.4)

## 2020-09-06 LAB — HEPARIN LEVEL (UNFRACTIONATED)
Heparin Unfractionated: 0.23 IU/mL — ABNORMAL LOW (ref 0.30–0.70)
Heparin Unfractionated: 0.31 IU/mL (ref 0.30–0.70)
Heparin Unfractionated: >1.1 IU/mL — ABNORMAL HIGH (ref 0.30–0.70)

## 2020-09-06 LAB — HIV ANTIBODY (ROUTINE TESTING W REFLEX): HIV Screen 4th Generation wRfx: NONREACTIVE

## 2020-09-06 LAB — APTT: aPTT: 29 seconds (ref 24–36)

## 2020-09-06 MED ORDER — POTASSIUM CHLORIDE CRYS ER 20 MEQ PO TBCR
40.0000 meq | EXTENDED_RELEASE_TABLET | Freq: Once | ORAL | Status: AC
  Administered 2020-09-06: 40 meq via ORAL
  Filled 2020-09-06: qty 2

## 2020-09-06 MED ORDER — METOPROLOL SUCCINATE ER 50 MG PO TB24: 50.0000 mg | ORAL_TABLET | ORAL | Status: DC

## 2020-09-06 MED ORDER — ACETAMINOPHEN 325 MG PO TABS
650.0000 mg | ORAL_TABLET | Freq: Four times a day (QID) | ORAL | Status: DC | PRN
  Administered 2020-09-06 (×2): 650 mg via ORAL
  Filled 2020-09-06 (×2): qty 2

## 2020-09-06 MED ORDER — METOPROLOL SUCCINATE ER 50 MG PO TB24
100.0000 mg | ORAL_TABLET | Freq: Every day | ORAL | Status: DC
  Administered 2020-09-06 – 2020-09-07 (×2): 100 mg via ORAL
  Filled 2020-09-06 (×2): qty 2

## 2020-09-06 MED ORDER — PANTOPRAZOLE SODIUM 40 MG PO TBEC
40.0000 mg | DELAYED_RELEASE_TABLET | Freq: Every day | ORAL | Status: DC
  Administered 2020-09-06 – 2020-09-08 (×3): 40 mg via ORAL
  Filled 2020-09-06 (×3): qty 1

## 2020-09-06 MED ORDER — HEPARIN (PORCINE) 25000 UT/250ML-% IV SOLN
1150.0000 [IU]/h | INTRAVENOUS | Status: DC
  Administered 2020-09-06: 1000 [IU]/h via INTRAVENOUS
  Filled 2020-09-06 (×2): qty 250

## 2020-09-06 MED ORDER — ACETAMINOPHEN 650 MG RE SUPP: 650.0000 mg | Freq: Four times a day (QID) | RECTAL | Status: DC | PRN

## 2020-09-06 MED ORDER — METOPROLOL SUCCINATE ER 50 MG PO TB24
50.0000 mg | ORAL_TABLET | Freq: Every day | ORAL | Status: DC
  Administered 2020-09-06 – 2020-09-08 (×3): 50 mg via ORAL
  Filled 2020-09-06 (×3): qty 1

## 2020-09-06 MED ORDER — ATORVASTATIN CALCIUM 40 MG PO TABS
80.0000 mg | ORAL_TABLET | Freq: Every day | ORAL | Status: DC
  Administered 2020-09-06 – 2020-09-07 (×2): 80 mg via ORAL
  Filled 2020-09-06 (×2): qty 2

## 2020-09-06 NOTE — Progress Notes (Signed)
Orthopedic Tech Progress Note Patient Details:  KAMBRIE STOLTENBERG [birth year] [id number]  Ortho Devices Type of Ortho Device: Long arm splint Ortho Device/Splint Location: LUE Ortho Device/Splint Interventions: Ordered, Application, Adjustment   Post Interventions Patient Tolerated: Well Instructions Provided: Care of device, Poper ambulation with device  Ridhaan Dreibelbis 09/06/2020, 2:55 AM

## 2020-09-06 NOTE — Progress Notes (Addendum)
ANTICOAGULATION CONSULT NOTE  Pharmacy Consult for Heparin Indication: atrial fibrillation and mechanical valve  Allergies  Allergen Reactions   Morphine Hives and Rash   Oxycodone Hcl Hives and Itching   Dilaudid [Hydromorphone Hcl] Itching    Patient Measurements: Height: '5\' 4"'$  (162.6 cm) Weight: 104.8 kg (231 lb) IBW/kg (Calculated) : 54.7 HEPARIN DW (KG): 79.3   Vital Signs: Temp: 98.5 F (36.9 C) (08/20 1040) Temp Source: Oral (08/20 1040) BP: 132/70 (08/20 1040) Pulse Rate: 94 (08/20 1040)  Labs: Recent Labs    09/05/20 0004 09/05/20 1146 09/05/20 2202 09/05/20 2202 09/06/20 0300 09/06/20 0649 09/06/20 1316  HGB  --   --  8.8*   < > 7.7* 8.1*  --   HCT  --   --  27.4*  --  23.9* 24.8*  --   PLT  --   --  340  --  303 328  --   APTT  --   --  29  --   --   --   --   LABPROT  --   --  14.6  --   --  14.9  --   INR  --  1.3* 1.1  --   --  1.2  --   HEPARINUNFRC  --   --   --   --   --  0.23* 0.31  CREATININE 1.14*  --   --   --   --  1.22*  --    < > = values in this interval not displayed.     Estimated Creatinine Clearance: 62.2 mL/min (A) (by C-G formula based on SCr of 1.22 mg/dL (H)).  Medications:  Warfarin '2mg'$  daily except '1mg'$  on MWF-->Held peri-op on 8/10 & resumed 8/19 with '3mg'$  x1 dose. Lovenox '100mg'$  Sq BID peri-op started on 8/10.  Held 8/15 doses for procedure then resumed 8/16.  Last doe 8/19 AM.  Assessment: 55 yo F on chronic warfarin for Afib with MVR.  She had surgery for ulnar nerve on 8/15 and presents today due to persistant oozing from wound. Her warfarin has been on hold since 8/10 & she has been bridging with Lovenox '1mg'$ /kg BID dosing.  She resumed Coumadin after visit to [facility]. Bleeding has subsided after TXA administered topically.  However, due to low Hg patient is being admitted for observation with Pharmacy to dose IV heparin.  Today, 09/06/2020: Hgb slightly lower than admission but no active bleeding noted; dressing  remains clean & dry Plt stable WNL Heparin level timed for 1200 drawn ~5 hr early; disregard First true heparin level therapeutic but borderline low on 1000 units/hr No infusion issues per RN  Goal of Therapy:  Heparin level 0.3-0.7 units/ml INR 2.5-3.5 Monitor platelets by anticoagulation protocol: Yes   Plan:  Increase heparin infusion slightly to 1050 units/hr Recheck confirmatory heparin level in 8h Daily heparin level & CBC Monitor closely for s/sx of bleeding F/U ability to resume Lovenox/warfarin  Nilam Quakenbush A PharmD 09/06/2020,2:05 PM

## 2020-09-06 NOTE — Progress Notes (Signed)
ANTICOAGULATION CONSULT NOTE - Initial Consult  Pharmacy Consult for Heparin Indication: atrial fibrillation and mechanical valve  Allergies  Allergen Reactions   Morphine Hives and Rash   Oxycodone Hcl Hives and Itching   Dilaudid [Hydromorphone Hcl] Itching    Patient Measurements: Height: '5\' 4"'  (162.6 cm) Weight: 104.8 kg (231 lb) IBW/kg (Calculated) : 54.7 HEPARIN DW (KG): 79.3   Vital Signs: Temp: 98.5 F (36.9 C) (08/19 2120) BP: 142/75 (08/20 0130) Pulse Rate: 80 (08/20 0130)  Labs: Recent Labs    09/05/20 0004 09/05/20 1146 09/05/20 2202  HGB  --   --  8.8*  HCT  --   --  27.4*  PLT  --   --  340  APTT  --   --  29  LABPROT  --   --  14.6  INR  --  1.3* 1.1  CREATININE 1.14*  --   --     Estimated Creatinine Clearance: 66.5 mL/min (A) (by C-G formula based on SCr of 1.14 mg/dL (H)).   Medical History: Past Medical History:  Diagnosis Date   (HFpEF) heart failure with preserved ejection fraction ([place])    a. 08/2017 Echo: EF 55-60%.  Grade 2 diastolic dysfunction.  Moderate mitral stenosis.   Allergy    Anemia    Anxiety    BRCA negative 03/22/2013   Bronchitis 02/19/2016   ON LEVAQUIN PO   Chest tightness    CHF (congestive heart failure) (HCC)    Cigarette smoker 09/11/2017   8-10 day   Dyspnea    Dysrhythmia    GERD (gastroesophageal reflux disease)    Heart murmur    Per pt. dx by Dr. Roxy Manns via stethescope   History of kidney stones    Hyperlipidemia    Hypertension    Interstitial lung disease ([place])    a. CT 2013 b. 02/2018 CXR noted recurrent intersistial changes ILD vs chronic bronchitis   Moderate mitral stenosis    a.  08/2017 TEE: EF 60 to 65%.  Moderate mitral stenosis.  Mean gradient 14 mmHg.  Valve area 2.59 cm by planimetry, 2.72 cm by pressure half-time.   Persistent atrial fibrillation ([place])    a. 08/2017 s/p TEE/DCCV; b. CHA2DS2VASc = 2-->warfarin.   Pneumonia    S/P Maze operation for atrial fibrillation 04/29/2020    Complete bilateral atrial lesion set using bipolar radiofrequency and cryothermy ablation with clipping of LA appendage   S/P mitral valve replacement with Onyx bileaflet mechanical valve 04/29/2020   27/29 mm Onyx-Valve    Medications:  Warfarin 76m daily except 160mon MWF-->Held peri-op on 8/10 & resumed 8/19 with 68m65m1 dose. Lovenox 100m24m BID peri-op started on 8/10.  Held 8/15 doses for procedure then resumed 8/16.  Last doe 8/19 AM.  Assessment: 55 y51F on chronic warfarin for Afib with MVR.  She had surgery for ulnar nerve on 8/15 and presents today due to persistant oozing from wound. Her warfarin has been on hold since 8/10 & she has been bridging with Lovenox 1mg/64mBID dosing.  She [facility]in today [facility]   Baseline labs:  INR 1.1, Hg 8.8, pltc 340 Bleeding has subsided after TXA administered topically.  However, due to low Hg patient is being admitted for observation.  Pharmacy has been consulted to dose IV heparin.    Goal of Therapy:  Heparin level 0.3-0.7 units/ml INR 2.5-3.5 Monitor platelets by anticoagulation protocol: Yes   Plan:  Start IV Heparin  infusion at 1000 units/hr Check heparin level in 8h Daily heparin level & CBC while on heparin Monitor closely for s/sx of bleeding F/U ability to resume Lovenox/warfarin  Netta Cedars PharmD 09/06/2020,1:54 AM

## 2020-09-06 NOTE — Progress Notes (Signed)
PROGRESS NOTE   Leslie [name]  [id number]    DOB: 03-Apr-1965    DOA: 09/05/2020  PCP: Lavera Guise, MD   I have briefly reviewed patients previous medical records in [facility].  Chief Complaint  Patient presents with   Post-op Problem   Bleeding/Bruising    Brief Narrative:  55 year old female with medical history significant of chronic diastolic CHF, anemia, anxiety, GERD, hypertension, hyperlipidemia, ILD, mitral stenosis s/p mechanical mitral valve replacement on chronic Coumadin with goal of 2.5-3.5 per patient, A. fib status post maze procedure, CKD stage III, obesity, recently had left upper extremity ulnar nerve surgery done on 8/15 by Dr. Gavin Pound which was complicated by postop bleeding and seen in Dr. Lequita Asal office on 3 consecutive days, dressing changed in office on 8/19, presented to the ED due to ongoing surgical site bleeding.   Assessment & Plan:  Principal Problem:   Blood loss anemia Active Problems:   Atrial fibrillation (HCC)   (HFpEF) heart failure with preserved ejection fraction (HCC)   CKD (chronic kidney disease), stage III (HCC)   S/P mitral valve replacement with Onyx bileaflet mechanical valve   Postop surgical site bleeding, s/p left upper extremity ulnar nerve surgery by Dr. Apolonio Schneiders 8/15: - As per H&P, in ED, patient had some oozing from the proximal portion of the wound but no pulsatile bleeding.  Topical tranexamic acid applied after which bleeding subsided.  Surgical site was wrapped and splint replaced.  - I contacted Dr. Lyla Glassing, orthopedics who recommended that I call Dr. Apolonio Schneiders directly.  I called Dr. Apolonio Schneiders who advised not to remove dressing at this time. - Obviously if she were to have recurrent bleeding, then to contact Dr. Apolonio Schneiders.  Acute blood loss anemia complicating chronic anemia: - Due to surgical site bleeding.  Hemoglobin usually ranges in the 8-9 range.  Was down to 7.7 earlier this morning but up to 8.1.   - Has been  typed and screened.  Follow CBC again in a.m. and transfuse if hemoglobin 7 g or less.  Mitral stenosis s/p mechanical mitral valve replacement/paroxysmal A. fib s/p maze procedure: - Patient is on Coumadin for A. fib and mechanical mitral valve.  Her Coumadin was stopped 5 days prior to surgery.  Coumadin was resumed on 8/19.  She was on a Lovenox bridge. - Given recent surgical site bleeding, her case was discussed by EDP with on-call cardiologist who recommended starting IV heparin. Pending stability of hemoglobin and no further bleeding, can transition to Lovenox/Coumadin on 8/21.  This was discussed by me with Dr. Apolonio Schneiders and he is agreeable with this plan.  Chronic diastolic CHF: Compensated  GERD Continue PPI  Essential hypertension Controlled.  Continue metoprolol.  Hyperlipidemia Continue statins.  CKD stage IIIa Creatinine likely at baseline.  Hypokalemia Replace and follow BMP  Body mass index is 39.65 kg/m./Obesity   DVT prophylaxis:   On IV heparin drip.   Code Status: Full Code Family Communication: Discussed in detail with patient spouse at bedside, updated care and answered all questions. Disposition:  Status is: Observation  The patient remains OBS appropriate and will d/c before 2 midnights.  Dispo: The patient is from: Home              Anticipated d/c is to: Home              Patient currently is not medically stable to d/c.   Difficult to place patient No  Consultants:   Case has been discussed with orthopedic/hand surgery and Cardiology.  Procedures:   None.  Antimicrobials:    Anti-infectives (From admission, onward)    None         Subjective:  Denies complaints.  No pain reported.  No further bleeding since dressing change in ED.  Wants to eat  Objective:   Vitals:   09/06/20 0612 09/06/20 0642 09/06/20 1040 09/06/20 1444  BP:  (!) 152/77 132/70 118/60  Pulse:  99 94 85  Resp:  16  18  Temp: 97.9 F (36.6 C) 98 F  (36.7 C) 98.5 F (36.9 C) 98.3 F (36.8 C)  TempSrc: Oral Oral Oral Oral  SpO2:  100% 100% 100%  Weight:      Height:        General exam: Young female, moderately built and obese lying comfortably propped up in bed without distress. Respiratory system: Clear to auscultation. Respiratory effort normal. Cardiovascular system: S1 & S2 heard, RRR. No JVD, murmurs, rubs, gallops or clicks. No pedal edema.  Telemetry personally reviewed: Sinus rhythm. Gastrointestinal system: Abdomen is nondistended, soft and nontender. No organomegaly or masses felt. Normal bowel sounds heard. Central nervous system: Alert and oriented. No focal neurological deficits. Extremities: Symmetric 5 x 5 power.  Left upper extremity with dressing in in a sling.  Able to move her fingers without difficulty or discomfort and good capillary refill. Skin: No rashes, lesions or ulcers Psychiatry: Judgement and insight appear normal. Mood & affect appropriate.     Data Reviewed:   I have personally reviewed following labs and imaging studies   CBC: Recent Labs  Lab 09/05/20 2202 09/06/20 0300 09/06/20 0649  WBC 9.7 9.3 9.3  NEUTROABS 6.4  --   --   HGB 8.8* 7.7* 8.1*  HCT 27.4* 23.9* 24.8*  MCV 91.0 90.9 92.2  PLT 340 303 [id number]    Basic Metabolic Panel: Recent Labs  Lab 09/05/20 0004 09/06/20 0649  NA 135 136  K 3.2* 3.3*  CL 97* 97*  CO2 28 28  GLUCOSE 126* 123*  BUN 19 19  CREATININE 1.14* 1.22*  CALCIUM 9.0 8.8*  MG  --  2.1    Liver Function Tests: Recent Labs  Lab 09/06/20 0649  AST 25  ALT 32  ALKPHOS 116  BILITOT 0.5  PROT 7.8  ALBUMIN 3.8    CBG: No results for input(s): GLUCAP in the last 168 hours.  Microbiology Studies:   Recent Results (from the past 240 hour(s))  Resp Panel by RT-PCR (Flu A&B, Covid) Nasopharyngeal Swab     Status: None   Collection Time: 09/06/20  3:26 AM   Specimen: Nasopharyngeal Swab; Nasopharyngeal(NP) swabs in vial transport medium  Result  Value Ref Range Status   SARS Coronavirus 2 by RT PCR NEGATIVE NEGATIVE Final    Comment: (NOTE) SARS-CoV-2 target nucleic acids are NOT DETECTED.  The SARS-CoV-2 RNA is generally detectable in upper respiratory specimens during the acute phase of infection. The lowest concentration of SARS-CoV-2 viral copies this assay can detect is 138 copies/mL. A negative result does not preclude SARS-Cov-2 infection and should not be used as the sole basis for treatment or other patient management decisions. A negative result may occur with  improper specimen collection/handling, submission of specimen other than nasopharyngeal swab, presence of viral mutation(s) within the areas targeted by this assay, and inadequate number of viral copies(<138 copies/mL). A negative result must be combined with clinical observations, patient history, and epidemiological information.  The expected result is Negative.  Fact Sheet for Patients:  EntrepreneurPulse.com.au  Fact Sheet for Healthcare Providers:  IncredibleEmployment.be  This test is no t yet approved or cleared by the [place] FDA and  has been authorized for detection and/or diagnosis of SARS-CoV-2 by FDA under an Emergency Use Authorization (EUA). This EUA will remain  in effect (meaning this test can be used) for the duration of the COVID-19 declaration under Section 564(b)(1) of the Act, 21 U.S.C.section 360bbb-3(b)(1), unless the authorization is terminated  or revoked sooner.       Influenza A by PCR NEGATIVE NEGATIVE Final   Influenza B by PCR NEGATIVE NEGATIVE Final    Comment: (NOTE) The Xpert Xpress SARS-CoV-2/FLU/RSV plus assay is intended as an aid in the diagnosis of influenza from Nasopharyngeal swab specimens and should not be used as a sole basis for treatment. Nasal washings and aspirates are unacceptable for Xpert Xpress SARS-CoV-2/FLU/RSV testing.  Fact Sheet for  Patients: EntrepreneurPulse.com.au  Fact Sheet for Healthcare Providers: IncredibleEmployment.be  This test is not yet approved or cleared by the [place] FDA and has been authorized for detection and/or diagnosis of SARS-CoV-2 by FDA under an Emergency Use Authorization (EUA). This EUA will remain in effect (meaning this test can be used) for the duration of the COVID-19 declaration under Section 564(b)(1) of the Act, 21 U.S.C. section 360bbb-3(b)(1), unless the authorization is terminated or revoked.  Performed at [facility], [place] [address] [place], [place] [address]      Radiology Studies:  No results found.   Scheduled Meds:    atorvastatin  80 mg Oral QHS   metoprolol succinate  50 mg Oral Daily   And   metoprolol succinate  100 mg Oral QHS   pantoprazole  40 mg Oral Daily    Continuous Infusions:    heparin 1,050 Units/hr (09/06/20 1417)     LOS: 0 days     Vernell Leep, MD, [place], [facility]. [facility]    To contact the attending provider between 7A-7P or the covering provider during after hours 7P-7A, please log into the web site [email or web address] and access using universal [place] password for that web site. If you do not have the password, please call the hospital operator.  09/06/2020, 6:08 PM

## 2020-09-06 NOTE — ED Notes (Addendum)
Secretary called ortho tech to come apply splint.

## 2020-09-06 NOTE — H&P (Signed)
History and Physical    Leslie [name] NOB:[id number] DOB: April 06, 1965 DOA: 09/05/2020  PCP: Lavera Guise, MD Patient coming from: Home  Chief Complaint: Bleeding from surgical site  HPI: Leslie [name] is a 55 y.o. female with medical history significant of chronic diastolic CHF, anemia, anxiety, GERD, hypertension, hyperlipidemia, ILD, mitral stenosis status post mechanical mitral valve replacement, A. fib status post maze procedure, CKD stage III, obesity (BMI 39.65).  Patient recently had left upper extremity ulnar nerve surgery done on 8/15 by Dr. Apolonio Schneiders and came into the ED for evaluation of excessive bleeding from surgical site.  She is on Coumadin due to A. fib and history of mechanical mitral valve.  Her Coumadin was stopped 5 days prior to the surgery and she has been on Lovenox bridge.  Coumadin was restarted yesterday.  States she has continued to have bleeding from the surgical site since after her surgery and has already been to orthopedics office 3 times and each time her dressing was changed.  States she started feeling dizzy/lightheaded and decided to come into the ED to be evaluated.  Denies chest pain or shortness of breath.  No other complaints.  In the ED, patient was hemodynamically stable.  Hemoglobin 8.8, was 10.9 on 07/23/2020.  Potassium mildly low at 3.2.  Creatinine 1.1, stable.  INR subtherapeutic at 1.1.  Screening COVID and influenza panel pending.  Patient had some oozing from the proximal portion of the wound but no pulsatile bleeding.  Topical tranexamic acid applied after which bleeding subsided.  Surgical site was wrapped and splint replaced. ED physician discussed the case with on-call cardiologist Dr. Conley Canal who recommended starting IV heparin given subtherapeutic INR and mechanical mitral valve.  If hemoglobin remains stable, then transition to Lovenox/Coumadin tomorrow.  ED physician also discussed the case with on-call physician for hand surgery Dr. Lyla Glassing who  also agreed with giving IV heparin.   Review of Systems:  All systems reviewed and apart from history of presenting illness, are negative.  Past Medical History:  Diagnosis Date   (HFpEF) heart failure with preserved ejection fraction ([place])    a. 08/2017 Echo: EF 55-60%.  Grade 2 diastolic dysfunction.  Moderate mitral stenosis.   Allergy    Anemia    Anxiety    BRCA negative 03/22/2013   Bronchitis 02/19/2016   ON LEVAQUIN PO   Chest tightness    CHF (congestive heart failure) (HCC)    Cigarette smoker 09/11/2017   8-10 day   Dyspnea    Dysrhythmia    GERD (gastroesophageal reflux disease)    Heart murmur    Per pt. dx by Dr. Roxy Manns via stethescope   History of kidney stones    Hyperlipidemia    Hypertension    Interstitial lung disease ([place])    a. CT 2013 b. 02/2018 CXR noted recurrent intersistial changes ILD vs chronic bronchitis   Moderate mitral stenosis    a.  08/2017 TEE: EF 60 to 65%.  Moderate mitral stenosis.  Mean gradient 14 mmHg.  Valve area 2.59 cm by planimetry, 2.72 cm by pressure half-time.   Persistent atrial fibrillation ([place])    a. 08/2017 s/p TEE/DCCV; b. CHA2DS2VASc = 2-->warfarin.   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 04/29/2020   27/29 mm Onyx-Valve  Past Surgical History:  Procedure Laterality Date   ABDOMINAL HYSTERECTOMY     total   ABDOMINAL HYSTERECTOMY     BREAST BIOPSY Bilateral 2012   BREAST BIOPSY Right 08-07-12   fibroadenomatous changes and columnar cells   BREAST BIOPSY  02/03/2015   stereo byrnett   BUBBLE STUDY  04/01/2020   Procedure: BUBBLE STUDY;  Surgeon: [name], MD;  Location: [place];  Service: Cardiovascular;;   CARDIAC CATHETERIZATION     CHOLECYSTECTOMY N/A 02/27/2016   Procedure:  LAPAROSCOPIC CHOLECYSTECTOMY;  Surgeon: Christene Lye, MD;  Location: [facility] ORS;  Service: General;  Laterality: N/A;   CLIPPING OF ATRIAL APPENDAGE  04/29/2020   Procedure: CLIPPING OF ATRIAL APPENDAGE USING ATRICURE  PRO2 CLIP SIZE 45MM;  Surgeon: Rexene Alberts, MD;  Location: [facility] OR;  Service: Open Heart Surgery;;   COLONOSCOPY WITH PROPOFOL N/A 09/26/2018   Procedure: COLONOSCOPY WITH PROPOFOL;  Surgeon: Lucilla Lame, MD;  Location: [facility] ENDOSCOPY;  Service: Endoscopy;  Laterality: N/A;   CYSTOSCOPY W/ RETROGRADES Right 08/18/2018   Procedure: CYSTOSCOPY WITH RETROGRADE PYELOGRAM;  Surgeon: Billey Co, MD;  Location: [facility] ORS;  Service: Urology;  Laterality: Right;   CYSTOSCOPY/URETEROSCOPY/HOLMIUM LASER/STENT PLACEMENT Right 08/18/2018   Procedure: CYSTOSCOPY/URETEROSCOPY/STENT PLACEMENT;  Surgeon: Billey Co, MD;  Location: [facility] ORS;  Service: Urology;  Laterality: Right;   DIAGNOSTIC LAPAROSCOPY     ESOPHAGOGASTRODUODENOSCOPY (EGD) WITH PROPOFOL N/A 09/26/2018   Procedure: ESOPHAGOGASTRODUODENOSCOPY (EGD) WITH PROPOFOL;  Surgeon: Lucilla Lame, MD;  Location: [facility] ENDOSCOPY;  Service: Endoscopy;  Laterality: N/A;   GIVENS CAPSULE STUDY N/A 11/03/2018   Procedure: GIVENS CAPSULE STUDY;  Surgeon: Lucilla Lame, MD;  Location: [facility] ENDOSCOPY;  Service: Endoscopy;  Laterality: N/A;   JOINT REPLACEMENT Left    knee   KNEE ARTHROPLASTY Right 04/09/2019   Procedure: COMPUTER ASSISTED TOTAL KNEE ARTHROPLASTY;  Surgeon: Dereck Leep, MD;  Location: [facility];  Service: Orthopedics;  Laterality: Right;   KNEE CLOSED REDUCTION Left 04/15/2015   Procedure: CLOSED MANIPULATION KNEE;  Surgeon: Thornton Park, MD;  Location: [facility];  Service: Orthopedics;  Laterality: Left;   KNEE SURGERY     MAZE N/A 04/29/2020   Procedure: MAZE;  Surgeon: Rexene Alberts, MD;  Location: [place];  Service: Open Heart Surgery;  Laterality: N/A;   MITRAL VALVE REPLACEMENT N/A 04/29/2020   Procedure: MITRAL  VALVE (MV) REPLACEMENT USING ON-X VALVE SIZE 27/29MM;  Surgeon: Rexene Alberts, MD;  Location: [place];  Service: Open Heart Surgery;  Laterality: N/A;   MULTIPLE EXTRACTIONS WITH ALVEOLOPLASTY N/A 02/28/2020   Procedure: MULTIPLE EXTRACTION WITH ALVEOLOPLASTY;  Surgeon: [name], DMD;  Location: [place];  Service: Dentistry;  Laterality: N/A;   RIGHT/LEFT HEART CATH AND CORONARY ANGIOGRAPHY Bilateral 09/06/2019   Procedure: RIGHT/LEFT HEART CATH AND CORONARY ANGIOGRAPHY;  Surgeon: Minna Merritts, MD;  Location: [place] CV LAB;  Service: Cardiovascular;  Laterality: Bilateral;   TEE WITHOUT CARDIOVERSION N/A 09/13/2017   Procedure: TRANSESOPHAGEAL ECHOCARDIOGRAM (TEE);  Surgeon: Nelva Bush, MD;  Location: [facility];  Service: Cardiovascular;  Laterality: N/A;   TEE WITHOUT CARDIOVERSION N/A 04/01/2020   Procedure: TRANSESOPHAGEAL ECHOCARDIOGRAM (TEE);  Surgeon: [name], MD;  Location: [facility] ENDOSCOPY;  Service: Cardiovascular;  Laterality: N/A;   TEE WITHOUT CARDIOVERSION N/A 04/29/2020   Procedure: TRANSESOPHAGEAL ECHOCARDIOGRAM (TEE);  Surgeon: Rexene Alberts, MD;  Location: [place];  Service: Open Heart Surgery;  Laterality: N/A;   TOTAL KNEE ARTHROPLASTY Left 12/25/2014   Procedure: TOTAL  KNEE ARTHROPLASTY;  Surgeon: Thornton Park, MD;  Location: [facility] OR[facility];  Service: Orthopedics;  Laterality: Left;   TUBAL LIGATION       reports that she quit smoking about 3 years ago. Her smoking use included cigarettes. She has a 10.00 pack-year smoking history. She has never used smokeless tobacco. She reports current alcohol use. She reports that she does not use drugs.  Allergies  Allergen Reactions   Morphine Hives and Rash   Oxycodone Hcl Hives and Itching   Dilaudid [Hydromorphone Hcl] Itching    Family History  Problem Relation Age of Onset   Cancer Mother 28       breast   Hypertension Mother    Cancer Maternal Aunt        breast   Cancer Maternal Grandmother         breast   Osteoarthritis Father    Hypertension Father     Prior to Admission medications   Medication Sig Start Date End Date Taking? Authorizing Provider  acetaminophen (TYLENOL) 500 MG tablet Take 1,000 mg by mouth every 6 (six) hours as needed for moderate pain.   Yes [provider]  albuterol (VENTOLIN HFA) 108 (90 Base) MCG/ACT inhaler Inhale 2 puffs into the lungs every 6 (six) hours as needed for wheezing or shortness of breath. 01/25/20  Yes Luiz Ochoa, NP  ALPRAZolam Duanne Moron) 0.5 MG tablet Take 0.5 tablets (0.25 mg total) by mouth at bedtime as needed for anxiety. 07/04/20  Yes Lavera Guise, MD  amLODipine (NORVASC) 10 MG tablet Take 1 tablet (10 mg total) by mouth daily. 06/02/20  Yes Loel Dubonnet, NP  atorvastatin (LIPITOR) 80 MG tablet TAKE 1 TABLET BY MOUTH EVERY DAY AT 6 PM 06/04/20  Yes Loel Dubonnet, NP  calcium carbonate (TUMS - DOSED IN MG ELEMENTAL CALCIUM) 500 MG chewable tablet Chew 500 mg by mouth daily as needed for indigestion or heartburn.   Yes [provider]  enoxaparin (LOVENOX) 100 MG/ML injection Inject 1 mL (100 mg total) into the skin every 12 (twelve) hours. 09/05/20  Yes Minna Merritts, MD  ferrous [id number] C-folic acid (TRINSICON / FOLTRIN) capsule Take 1 capsule by mouth 2 (two) times daily after a meal. 08/18/20  Yes Lavera Guise, MD  fluticasone [facility]) 50 MCG/ACT nasal spray Place 1 spray into both nostrils daily as needed for rhinitis.   Yes [provider]  HYDROcodone-acetaminophen (NORCO) 10-325 MG tablet Take 1 tablet by mouth every 6 (six) hours as needed. 09/01/20  Yes [provider]  metoprolol succinate (TOPROL-XL) 50 MG 24 hr tablet Take 1 tablet (50 mg) by mouth in the morning & take 2 tablets (100 mg) by mouth in the evening. Take with or immediately following a meal. 07/07/20  Yes Gollan, Kathlene November, MD  omeprazole (PRILOSEC) 40 MG capsule Take 1 capsule (40 mg total) by mouth daily.  07/15/20  Yes Lavera Guise, MD  oxyCODONE-acetaminophen (PERCOCET) 10-325 MG tablet Take 1 tablet by mouth every 6 (six) hours as needed. 09/02/20  Yes [provider]  potassium chloride SA (KLOR-CON) 20 MEQ tablet Take 1 tablet (20 mEq total) by mouth 2 (two) times daily. 06/04/20 12/01/20 Yes Loel Dubonnet, NP  spironolactone (ALDACTONE) 25 MG tablet Take 0.5 tablets (12.5 mg total) by mouth daily. 06/02/20  Yes Loel Dubonnet, NP  torsemide (DEMADEX) 20 MG tablet TAKE 2 TABLETS(40 MG) BY MOUTH TWICE DAILY 08/28/20  Yes Loel Dubonnet, NP  warfarin (COUMADIN) 2 MG tablet TAKE 1 TABLET BY MOUTH DAILY AT 4PM AS INSTRUCTED BY COUMADIN CLINIC FOR LIFETIME Patient taking differently: Friday 1.5 qd 08/20, 1/2 tablet til Wednesday INR check 07/17/20  Yes End, Harrell Gave, MD    Physical Exam: Vitals:   09/06/20 0100 09/06/20 0115 09/06/20 0130 09/06/20 0200  BP: 123/69 (!) 146/81 (!) 142/75 (!) 161/86  Pulse: 80 81 80 79  Resp:   17 17  Temp:      SpO2: 100% 100% 100% 100%  Weight:      Height:        Physical Exam Constitutional:      General: She is not in acute distress. HENT:     Head: Normocephalic and atraumatic.  Eyes:     Extraocular Movements: Extraocular movements intact.     Conjunctiva/sclera: Conjunctivae normal.  Cardiovascular:     Rate and Rhythm: Normal rate and regular rhythm.     Pulses: Normal pulses.  Pulmonary:     Effort: Pulmonary effort is normal. No respiratory distress.     Breath sounds: Normal breath sounds. No wheezing or rales.  Abdominal:     General: Bowel sounds are normal. There is no distension.     Palpations: Abdomen is soft.     Tenderness: There is no abdominal tenderness.  Musculoskeletal:        General: No swelling or tenderness.     Cervical back: Normal range of motion and neck supple.     Comments: Left arm dressing and splint in place.  Clean and dry, not soaked with blood.  Skin:    General: Skin is warm and dry.   Neurological:     General: No focal deficit present.     Mental Status: She is alert and oriented to person, place, and time.     Labs on Admission: I have personally reviewed following labs and imaging studies  CBC: Recent Labs  Lab 09/05/20 2202 09/06/20 0300  WBC 9.7 9.3  NEUTROABS 6.4  --   HGB 8.8* 7.7*  HCT 27.4* 23.9*  MCV 91.0 90.9  PLT 340 967   Basic Metabolic Panel: Recent Labs  Lab 09/05/20 0004  NA 135  K 3.2*  CL 97*  CO2 28  GLUCOSE 126*  BUN 19  CREATININE 1.14*  CALCIUM 9.0   GFR: Estimated Creatinine Clearance: 66.5 mL/min (A) (by C-G formula based on SCr of 1.14 mg/dL (H)). Liver Function Tests: No results for input(s): AST, ALT, ALKPHOS, BILITOT, PROT, ALBUMIN in the last 168 hours. No results for input(s): LIPASE, AMYLASE in the last 168 hours. No results for input(s): AMMONIA in the last 168 hours. Coagulation Profile: Recent Labs  Lab 09/05/20 1146 09/05/20 2202  INR 1.3* 1.1   Cardiac Enzymes: No results for input(s): CKTOTAL, CKMB, CKMBINDEX, TROPONINI in the last 168 hours. BNP (last 3 results) No results for input(s): PROBNP in the last 8760 hours. HbA1C: No results for input(s): HGBA1C in the last 72 hours. CBG: No results for input(s): GLUCAP in the last 168 hours. Lipid Profile: No results for input(s): CHOL, HDL, LDLCALC, TRIG, CHOLHDL, LDLDIRECT in the last 72 hours. Thyroid Function Tests: No results for input(s): TSH, T4TOTAL, FREET4, T3FREE, THYROIDAB in the last 72 hours. Anemia Panel: No results for input(s): VITAMINB12, FOLATE, FERRITIN, TIBC, IRON, RETICCTPCT in the last 72 hours. Urine analysis:    Component Value Date/Time   COLORURINE YELLOW 04/25/2020 1100   APPEARANCEUR HAZY (A) 04/25/2020 1100   APPEARANCEUR Cloudy (  A) 03/27/2020 1254   LABSPEC 1.009 04/25/2020 1100   LABSPEC 1.015 04/05/2012 0827   PHURINE 7.0 04/25/2020 1100   GLUCOSEU NEGATIVE 04/25/2020 1100   GLUCOSEU Negative 04/05/2012 0827    HGBUR NEGATIVE 04/25/2020 1100   BILIRUBINUR Negative 08/08/2020 1113   BILIRUBINUR Negative 03/27/2020 1254   BILIRUBINUR Negative 04/05/2012 0827   KETONESUR NEGATIVE 04/25/2020 1100   PROTEINUR Negative 08/08/2020 1113   PROTEINUR NEGATIVE 04/25/2020 1100   UROBILINOGEN 0.2 08/08/2020 1113   NITRITE Negative 08/08/2020 1113   NITRITE NEGATIVE 04/25/2020 1100   LEUKOCYTESUR Negative 08/08/2020 1113   LEUKOCYTESUR NEGATIVE 04/25/2020 1100   LEUKOCYTESUR Negative 04/05/2012 0827    Radiological Exams on Admission: No results found.  EKG: Independently reviewed.  Normal sinus rhythm.  Assessment/Plan Principal Problem:   Blood loss anemia Active Problems:   Atrial fibrillation (HCC)   (HFpEF) heart failure with preserved ejection fraction (HCC)   CKD (chronic kidney disease), stage III (HCC)   S/P mitral valve replacement with Onyx bileaflet mechanical valve   Acute on chronic blood loss anemia secondary to bleeding from surgical site Patient recently had left upper extremity ulnar nerve surgery done on 8/15 by Dr. Apolonio Schneiders and came into the ED for evaluation of excessive bleeding from surgical site.  She is on Coumadin due to A. fib and history of mechanical mitral valve.  Her Coumadin was stopped 5 days prior to the surgery and she has been on Lovenox bridge.  Coumadin was restarted yesterday.  Patient remains hemodynamically stable.  Hemoglobin 8.8, was 10.9 on 07/23/2020.  INR subtherapeutic at 1.1. Patient had some oozing from the proximal portion of the wound but no pulsatile bleeding.  Topical tranexamic acid applied after which bleeding subsided.  Surgical site was wrapped and splint replaced. ED physician discussed the case with on-call cardiologist Dr. Conley Canal who recommended starting IV heparin given subtherapeutic INR and mechanical mitral valve.  If hemoglobin remains stable, then transition to Lovenox/Coumadin tomorrow.  ED physician also discussed the case with on-call  physician for hand surgery Dr. Lyla Glassing who also agreed with giving IV heparin.  IV heparin ordered in the ED. -Continue IV heparin and monitor very closely for bleeding.  Type and screen, monitor H&H.  Transfuse if hemoglobin is less than 7.  Patient has given verbal consent for blood transfusion.  Mitral stenosis status post mechanical mitral valve replacement Her Coumadin was stopped 5 days prior to the surgery and she has been on Lovenox bridge.  Coumadin was restarted yesterday. INR currently subtherapeutic at 1.1.  Goal INR 2.5-3.5. -Both cardiology and hand surgery recommending giving IV heparin for now.  If hemoglobin remains stable, then transition to Lovenox/Coumadin tomorrow.  Paroxysmal A. Fib Currently in sinus rhythm. -Continue IV heparin as mentioned above.  Continue metoprolol.  Chronic diastolic CHF Stable.  No signs of volume overload. -Monitor volume status closely  GERD -Continue PPI  Hypertension Blood pressure stable. -Continue metoprolol  Hyperlipidemia -Continue Lipitor  CKD stage III Stable.  Renal function at baseline.  Mild hypokalemia -Monitor potassium and magnesium levels, replenish as needed  DVT prophylaxis: Heparin Code Status: Full code Family Communication: No family available at this time. Disposition Plan: Status is: Observation  The patient remains OBS appropriate and will d/c before 2 midnights.  Dispo: The patient is from: Home              Anticipated d/c is to: Home              Patient  currently is not medically stable to d/c.   Difficult to place patient No  Level of care: Level of care: Telemetry  The medical decision making on this patient was of high complexity and the patient is at high risk for clinical deterioration, therefore this is a level 3 visit.  Leslie Leff MD [facility]  If 7PM-7AM, please contact night-coverage [email or web address]  09/06/2020, 3:56 AM

## 2020-09-07 DIAGNOSIS — I480 Paroxysmal atrial fibrillation: Secondary | ICD-10-CM | POA: Diagnosis not present

## 2020-09-07 DIAGNOSIS — R58 Hemorrhage, not elsewhere classified: Secondary | ICD-10-CM | POA: Diagnosis present

## 2020-09-07 DIAGNOSIS — T148XXA Other injury of unspecified body region, initial encounter: Secondary | ICD-10-CM

## 2020-09-07 DIAGNOSIS — D500 Iron deficiency anemia secondary to blood loss (chronic): Secondary | ICD-10-CM | POA: Diagnosis not present

## 2020-09-07 DIAGNOSIS — Z954 Presence of other heart-valve replacement: Secondary | ICD-10-CM | POA: Diagnosis not present

## 2020-09-07 LAB — CBC
HCT: 24.1 % — ABNORMAL LOW (ref 36.0–46.0)
Hemoglobin: 7.7 g/dL — ABNORMAL LOW (ref 12.0–15.0)
MCH: 29.7 pg (ref 26.0–34.0)
MCHC: 32 g/dL (ref 30.0–36.0)
MCV: 93.1 fL (ref 80.0–100.0)
Platelets: 289 10*3/uL (ref 150–400)
RBC: 2.59 MIL/uL — ABNORMAL LOW (ref 3.87–5.11)
RDW: 13.6 % (ref 11.5–15.5)
WBC: 9.4 10*3/uL (ref 4.0–10.5)
nRBC: 0.2 % (ref 0.0–0.2)

## 2020-09-07 LAB — BASIC METABOLIC PANEL
Anion gap: 9 (ref 5–15)
BUN: 21 mg/dL — ABNORMAL HIGH (ref 6–20)
CO2: 27 mmol/L (ref 22–32)
Calcium: 9 mg/dL (ref 8.9–10.3)
Chloride: 99 mmol/L (ref 98–111)
Creatinine, Ser: 1.25 mg/dL — ABNORMAL HIGH (ref 0.44–1.00)
GFR, Estimated: 51 mL/min — ABNORMAL LOW (ref >60)
Glucose, Bld: 143 mg/dL — ABNORMAL HIGH (ref 70–99)
Potassium: 3.1 mmol/L — ABNORMAL LOW (ref 3.5–5.1)
Sodium: 135 mmol/L (ref 135–145)

## 2020-09-07 LAB — PREPARE RBC (CROSSMATCH)

## 2020-09-07 LAB — HEPARIN LEVEL (UNFRACTIONATED)
Heparin Unfractionated: 0.27 IU/mL — ABNORMAL LOW (ref 0.30–0.70)
Heparin Unfractionated: 0.33 IU/mL (ref 0.30–0.70)

## 2020-09-07 MED ORDER — WARFARIN - PHARMACIST DOSING INPATIENT: Freq: Every day | Status: DC

## 2020-09-07 MED ORDER — SODIUM CHLORIDE 0.9% IV SOLUTION: Freq: Once | INTRAVENOUS | Status: AC

## 2020-09-07 MED ORDER — TORSEMIDE 20 MG PO TABS
40.0000 mg | ORAL_TABLET | Freq: Every day | ORAL | Status: DC
  Administered 2020-09-07 – 2020-09-08 (×2): 40 mg via ORAL
  Filled 2020-09-07 (×2): qty 2

## 2020-09-07 MED ORDER — SPIRONOLACTONE 12.5 MG HALF TABLET
12.5000 mg | ORAL_TABLET | Freq: Every day | ORAL | Status: DC
  Administered 2020-09-07 – 2020-09-08 (×2): 12.5 mg via ORAL
  Filled 2020-09-07 (×2): qty 1

## 2020-09-07 MED ORDER — OXYCODONE HCL 5 MG PO TABS
5.0000 mg | ORAL_TABLET | Freq: Four times a day (QID) | ORAL | Status: DC | PRN
  Administered 2020-09-07 (×2): 5 mg via ORAL
  Filled 2020-09-07 (×2): qty 1

## 2020-09-07 MED ORDER — WARFARIN SODIUM 3 MG PO TABS
3.0000 mg | ORAL_TABLET | Freq: Once | ORAL | Status: AC
  Administered 2020-09-07: 3 mg via ORAL
  Filled 2020-09-07: qty 1

## 2020-09-07 MED ORDER — POTASSIUM CHLORIDE CRYS ER 10 MEQ PO TBCR
40.0000 meq | EXTENDED_RELEASE_TABLET | ORAL | Status: AC
  Administered 2020-09-07 (×2): 40 meq via ORAL
  Filled 2020-09-07 (×2): qty 4

## 2020-09-07 MED ORDER — AMLODIPINE BESYLATE 10 MG PO TABS
10.0000 mg | ORAL_TABLET | Freq: Every day | ORAL | Status: DC
  Administered 2020-09-07 – 2020-09-08 (×2): 10 mg via ORAL
  Filled 2020-09-07 (×3): qty 1

## 2020-09-07 MED ORDER — ENOXAPARIN SODIUM 100 MG/ML IJ SOSY
100.0000 mg | PREFILLED_SYRINGE | Freq: Two times a day (BID) | INTRAMUSCULAR | Status: DC
  Administered 2020-09-07 – 2020-09-08 (×3): 100 mg via SUBCUTANEOUS
  Filled 2020-09-07 (×3): qty 1

## 2020-09-07 NOTE — Progress Notes (Signed)
Transfusion protocol reviewed with patient. Patient verbalizes understanding of s/s of possible rxn. Dual confirmation of correct patient and blood product performed. Transfusion initiated. This RN will monitor patient for first 15 minutes.

## 2020-09-07 NOTE — Progress Notes (Signed)
ANTICOAGULATION CONSULT NOTE  Pharmacy Consult for Heparin Indication: atrial fibrillation and mechanical valve  Allergies  Allergen Reactions   Morphine Hives and Rash   Oxycodone Hcl Hives and Itching   Dilaudid [Hydromorphone Hcl] Itching    Patient Measurements: Height: '5\' 4"'$  (162.6 cm) Weight: 104.8 kg (231 lb) IBW/kg (Calculated) : 54.7 HEPARIN DW (KG): 79.3   Vital Signs: Temp: 100.1 [name] (37.8 C) (08/20 2031) Temp Source: Oral (08/20 2031) BP: 136/73 (08/20 2031) Pulse Rate: 93 (08/20 2031)  Labs: Recent Labs    09/05/20 0004 09/05/20 1146 09/05/20 2202 09/05/20 2202 09/06/20 0300 09/06/20 0649 09/06/20 0649 09/06/20 1316 09/06/20 2207 09/06/20 2338  HGB  --   --  8.8*   < > 7.7* 8.1*  --   --   --   --   HCT  --   --  27.4*  --  23.9* 24.8*  --   --   --   --   PLT  --   --  340  --  303 328  --   --   --   --   APTT  --   --  29  --   --   --   --   --   --   --   LABPROT  --   --  14.6  --   --  14.9  --   --   --   --   INR  --  1.3* 1.1  --   --  1.2  --   --   --   --   HEPARINUNFRC  --   --   --   --   --  0.23*   < > 0.31 >1.10* 0.33  CREATININE 1.14*  --   --   --   --  1.22*  --   --   --   --    < > = values in this interval not displayed.     Estimated Creatinine Clearance: 62.2 mL/min (A) (by C-G formula based on SCr of 1.22 mg/dL (H)).  Medications:  Warfarin '2mg'$  daily except '1mg'$  on MWF-->Held peri-op on 8/10 & resumed 8/19 with '3mg'$  x1 dose. Lovenox '100mg'$  Sq BID peri-op started on 8/10.  Held 8/15 doses for procedure then resumed 8/16.  Last doe 8/19 AM.  Assessment: 55 yo [name] on chronic warfarin for Afib with MVR.  She had surgery for ulnar nerve on 8/15 and presents today due to persistant oozing from wound. Her warfarin has been on hold since 8/10 & she has been bridging with Lovenox '1mg'$ /kg BID dosing.  She resumed Coumadin after visit to [place] clinic. Bleeding has subsided after TXA administered topically.  However, due to low Hg  patient is being admitted for observation with Pharmacy to dose IV heparin.  Today, 09/07/2020: Heparin level 0.33- therapeutic on heparin infusion at 1050 units/hr *Disregard HL collected at 22:37 as it was obtained proximal to heparin infusion site  Hgb improved to 8.1, Plt stable WNL No infusion issues per RN, no active bleeding noted  Goal of Therapy:  Heparin level 0.3-0.7 units/ml INR 2.5-3.5 Monitor platelets by anticoagulation protocol: Yes   Plan:  Continue heparin infusion at 1050 units/hr Daily heparin level & CBC while on heparin Monitor closely for s/sx of bleeding Noted plan to resume Lovenox/warfarin if Hg stable with morning labs 8/21  Leslie [name] PharmD 09/07/2020,12:43 AM

## 2020-09-07 NOTE — Progress Notes (Signed)
[facility] for Lovenox/warfarin Indication: atrial fibrillation and mechanical valve  Allergies  Allergen Reactions   Morphine Hives and Rash   Oxycodone Hcl Hives and Itching   Dilaudid [Hydromorphone Hcl] Itching    Patient Measurements: Height: '5\' 4"'$  (162.6 cm) Weight: 104.8 kg (231 lb) IBW/kg (Calculated) : 54.7 HEPARIN DW (KG): 79.3   Vital Signs: Temp: 98.8 F (37.1 C) (08/21 0433) Temp Source: Oral (08/21 0433) BP: 149/79 (08/21 0433) Pulse Rate: 85 (08/21 0433)  Labs: Recent Labs    09/05/20 0004 09/05/20 1146 09/05/20 2202 09/05/20 2202 09/06/20 0300 09/06/20 0649 09/06/20 1316 09/06/20 2207 09/06/20 2338 09/07/20 0441 09/07/20 0451  HGB  --   --  8.8*   < > 7.7* 8.1*  --   --   --  7.7*  --   HCT  --   --  27.4*   < > 23.9* 24.8*  --   --   --  24.1*  --   PLT  --   --  340   < > 303 328  --   --   --  289  --   APTT  --   --  29  --   --   --   --   --   --   --   --   LABPROT  --   --  14.6  --   --  14.9  --   --   --   --   --   INR  --  1.3* 1.1  --   --  1.2  --   --   --   --   --   HEPARINUNFRC  --   --   --   --   --  0.23*   < > >1.10* 0.33  --  0.27*  CREATININE 1.14*  --   --   --   --  1.22*  --   --   --  1.25*  --    < > = values in this interval not displayed.     Estimated Creatinine Clearance: 60.7 mL/min (A) (by C-G formula based on SCr of 1.25 mg/dL (H)).  Medications:  Warfarin '2mg'$  daily except '1mg'$  on MWF-->Held peri-op on 8/10 & resumed 8/19 with '3mg'$  x1 dose. Lovenox '100mg'$  Sq BID peri-op started on 8/10.  Held 8/15 doses for procedure then resumed 8/16.  Last doe 8/19 AM.  Assessment: 55 yo F on chronic warfarin for Afib with MVR.  She had surgery for ulnar nerve on 8/15 and presents today due to persistant oozing from wound. Her warfarin has been on hold since 8/10 & she has been bridging with Lovenox '1mg'$ /kg BID dosing.  She resumed Coumadin after visit to [place] clinic. Bleeding has  subsided after TXA administered topically.  However, due to low Hg patient is being admitted for observation with Pharmacy to dose IV heparin.  Today, 09/07/2020: Discontinuing IV heparin today and restart Lovenox/wafarin  Goal of Therapy:  Heparin level 0.3-0.7 units/ml INR 2.5-3.5 Monitor platelets by anticoagulation protocol: Yes   Plan:  Discontinue IV heparin and start Lovenox '100mg'$  SQ q12 Restart warfarin at dose of '3mg'$  today at 1600 Daily INR and CBC Monitor closely for s/sx of bleeding    Adrian Saran, PharmD, BCPS Secure Chat if ?s 09/07/2020 11:03 AM

## 2020-09-07 NOTE — Progress Notes (Signed)
Patient requesting medication for pain control in addition to tylenol, also requesting stool softener. In addition, patient states she takes potassium supplement BID at home. K+ this morning 3.1. MD made aware of all above mentioned.

## 2020-09-07 NOTE — Progress Notes (Signed)
[facility]  PROGRESS NOTE  Leslie [name] [id number] DOB: 1965/07/20 DOA: 09/05/2020 PCP: Lavera Guise, MD   Brief HPI:    55 year old female with medical history of chronic diastolic CHF, anemia, anxiety, GERD, hypertension, hyperlipidemia, ILD, mitral stenosis status post mitral valve replacement on chronic Coumadin with goal of INR 2.5-3.5.  Atrial fibrillation status post maze procedure, CKD stage III, obesity recently had left upper extremity ulnar nerve surgery done on 8/15 by Dr. Gavin Pound which was complicated by postop bleeding and was seen Dr. Lequita Asal office on 3 consulted days.  Dressing change in office on 8/19 showed bleeding so she was sent to ED for ongoing surgical site bleeding.   Subjective   Patient seen and examined, denies bleeding.  Hemoglobin dropped to 7.7 this morning.   Assessment/Plan:    Postop surgical site bleeding -Patient s/p repair of left upper extremity ulnar nerve injury -Was sent to ED for continued bleeding from surgical site, topical tranexamic acid applied after which bleeding subsided -Surgical site was wrapped in splint was replaced -Dr. Algis Liming  contacted Dr. Lyla Glassing, orthopedics who recommended to call Dr. Apolonio Schneiders -Dr. Apolonio Schneiders advised not to remove dressing at this time  Acute blood loss anemia -Hemoglobin dropped to 7.7 -Her usual hemoglobin is 8-9 range -We will transfuse 1 unit PRBC -Follow CBC in a.m.  Hypokalemia -Potassium was 3.1 -Replace potassium with K. Dur 40 meq p.o. twice daily -Follow BMP in am  Mitral stenosis status post mechanical valve replacement/paroxysmal A. Fib -Patient is on Coumadin for atrial fibrillation and mechanical mitral valve -Coumadin was stopped 5 days prior to surgery -She was on Lovenox bridge along with Coumadin -EDP called cardiologist who used recommended starting IV heparin due to ongoing bleeding -As patient's bleeding has stopped we will transition to Lovenox bridge along with  Coumadin today -The plan was discussed with Dr. Apolonio Schneiders by Dr. Algis Liming on [id number]  Chronic diastolic CHF -Restart torsemide 40 mg daily  CKD stage IIIa -Creatinine at baseline  Hypertension -Continue metoprolol   Scheduled medications:    amLODipine  10 mg Oral Daily   atorvastatin  80 mg Oral QHS   enoxaparin (LOVENOX) injection  100 mg Subcutaneous Q12H   metoprolol succinate  50 mg Oral Daily   And   metoprolol succinate  100 mg Oral QHS   pantoprazole  40 mg Oral Daily   potassium chloride  40 mEq Oral Q4H   spironolactone  12.5 mg Oral Daily   torsemide  40 mg Oral Daily   warfarin  3 mg Oral ONCE-1600   Warfarin - Pharmacist Dosing Inpatient   Does not apply q1600         Data Reviewed:   CBG:  No results for input(s): GLUCAP in the last 168 hours.  SpO2: 100 %    Vitals:   09/07/20 1100 09/07/20 1324 09/07/20 1515 09/07/20 1548  BP: (!) 141/66 (!) 144/66 122/68 133/66  Pulse: 95 91 93 91  Resp:  '16 16 16  '$ Temp:  99.5 F (37.5 C) 99.2 F (37.3 C) 98.5 F (36.9 C)  TempSrc:  Oral Oral Oral  SpO2: 100% 100% 100% 100%  Weight:      Height:         Intake/Output Summary (Last 24 hours) at 09/07/2020 1629 Last data filed at 09/07/2020 1533 Gross per 24 hour  Intake 1116.88 ml  Output --  Net 1116.88 ml    08/19 1901 - 08/21 0700 In: 663.1 [P.O.:472; I.V.:191.1] Out: -  Filed Weights   09/05/20 2120  Weight: 104.8 kg    CBC:  Recent Labs  Lab 09/05/20 2202 09/06/20 0300 09/06/20 0649 09/07/20 0441  WBC 9.7 9.3 9.3 9.4  HGB 8.8* 7.7* 8.1* 7.7*  HCT 27.4* 23.9* 24.8* 24.1*  PLT 340 303 328 289  MCV 91.0 90.9 92.2 93.1  MCH 29.2 29.3 30.1 29.7  MCHC 32.1 32.2 32.7 32.0  RDW 13.2 13.2 13.2 13.6  LYMPHSABS 2.3  --   --   --   MONOABS 0.8  --   --   --   EOSABS 0.1  --   --   --   BASOSABS 0.0  --   --   --     Complete metabolic panel:  Recent Labs  Lab 09/05/20 0004 09/05/20 1146 09/05/20 2202 09/06/20 0649  09/07/20 0441  NA 135  --   --  136 135  K 3.2*  --   --  3.3* 3.1*  CL 97*  --   --  97* 99  CO2 28  --   --  28 27  GLUCOSE 126*  --   --  123* 143*  BUN 19  --   --  19 21*  CREATININE 1.14*  --   --  1.22* 1.25*  CALCIUM 9.0  --   --  8.8* 9.0  AST  --   --   --  25  --   ALT  --   --   --  32  --   ALKPHOS  --   --   --  116  --   BILITOT  --   --   --  0.5  --   ALBUMIN  --   --   --  3.8  --   MG  --   --   --  2.1  --   INR  --  1.3* 1.1 1.2  --     No results for input(s): LIPASE, AMYLASE in the last 168 hours.  Recent Labs  Lab 09/06/20 0326  SARSCOV2NAA NEGATIVE    ------------------------------------------------------------------------------------------------------------------ No results for input(s): CHOL, HDL, LDLCALC, TRIG, CHOLHDL, LDLDIRECT in the last 72 hours.  Lab Results  Component Value Date   HGBA1C 5.8 (A) 06/06/2020   ------------------------------------------------------------------------------------------------------------------ No results for input(s): TSH, T4TOTAL, T3FREE, THYROIDAB in the last 72 hours.  Invalid input(s): FREET3 ------------------------------------------------------------------------------------------------------------------ No results for input(s): VITAMINB12, FOLATE, FERRITIN, TIBC, IRON, RETICCTPCT in the last 72 hours.  Coagulation profile Recent Labs  Lab 09/05/20 1146 09/05/20 2202 09/06/20 0649  INR 1.3* 1.1 1.2   No results for input(s): DDIMER in the last 72 hours.  Cardiac Enzymes No results for input(s): CKTOTAL, CKMB, CKMBINDEX, TROPONINI in the last 168 hours.  ------------------------------------------------------------------------------------------------------------------    Component Value Date/Time   BNP 116.6 (H) 06/02/2020 1149   BNP 539.0 (H) 08/26/2018 2024     Antibiotics: Anti-infectives (From admission, onward)    None        Radiology Reports  No results found.    DVT  prophylaxis: Heparin  Code Status: Full code  Family Communication: Discussed with patient's husband at bedside   Consultants:   Procedures:     Objective    Physical Examination:   General-appears in no acute distress Heart-S1-S2, regular, no murmur auscultated Lungs-clear to auscultation bilaterally, no wheezing or crackles auscultated Abdomen-soft, nontender, no organomegaly Extremities-left upper extremity in dressing Neuro-alert, oriented x3, no focal deficit noted   Status is: Inpatient  Dispo: The patient is from: Home              Anticipated d/c is to: Home              Anticipated d/c date is: 09/08/2020              Patient currently not stable for discharge  Barrier to discharge-ongoing treatment for anemia, bleeding from surgical site  COVID-19 Labs  No results for input(s): DDIMER, FERRITIN, LDH, CRP in the last 72 hours.  Lab Results  Component Value Date   [place] NEGATIVE 09/06/2020   [place] NEGATIVE 04/25/2020   [place] NEGATIVE 03/28/2020   [place] NEGATIVE 02/26/2020    Microbiology  Recent Results (from the past 240 hour(s))  Resp Panel by RT-PCR (Flu A&B, Covid) Nasopharyngeal Swab     Status: None   Collection Time: 09/06/20  3:26 AM   Specimen: Nasopharyngeal Swab; Nasopharyngeal(NP) swabs in vial transport medium  Result Value Ref Range Status   SARS Coronavirus 2 by RT PCR NEGATIVE NEGATIVE Final    Comment: (NOTE) SARS-CoV-2 target nucleic acids are NOT DETECTED.  The SARS-CoV-2 RNA is generally detectable in upper respiratory specimens during the acute phase of infection. The lowest concentration of SARS-CoV-2 viral copies this assay can detect is 138 copies/mL. A negative result does not preclude SARS-Cov-2 infection and should not be used as the sole basis for treatment or other patient management decisions. A negative result may occur with  improper specimen collection/handling, submission of specimen  other than nasopharyngeal swab, presence of viral mutation(s) within the areas targeted by this assay, and inadequate number of viral copies(<138 copies/mL). A negative result must be combined with clinical observations, patient history, and epidemiological information. The expected result is Negative.  Fact Sheet for Patients:  EntrepreneurPulse.com.au  Fact Sheet for Healthcare Providers:  IncredibleEmployment.be  This test is no t yet approved or cleared by the [place] FDA and  has been authorized for detection and/or diagnosis of SARS-CoV-2 by FDA under an Emergency Use Authorization (EUA). This EUA will remain  in effect (meaning this test can be used) for the duration of the COVID-19 declaration under Section 564(b)(1) of the Act, 21 U.S.C.section 360bbb-3(b)(1), unless the authorization is terminated  or revoked sooner.       Influenza A by PCR NEGATIVE NEGATIVE Final   Influenza B by PCR NEGATIVE NEGATIVE Final    Comment: (NOTE) The Xpert Xpress SARS-CoV-2/FLU/RSV plus assay is intended as an aid in the diagnosis of influenza from Nasopharyngeal swab specimens and should not be used as a sole basis for treatment. Nasal washings and aspirates are unacceptable for Xpert Xpress SARS-CoV-2/FLU/RSV testing.  Fact Sheet for Patients: EntrepreneurPulse.com.au  Fact Sheet for Healthcare Providers: IncredibleEmployment.be  This test is not yet approved or cleared by the [place] FDA and has been authorized for detection and/or diagnosis of SARS-CoV-2 by FDA under an Emergency Use Authorization (EUA). This EUA will remain in effect (meaning this test can be used) for the duration of the COVID-19 declaration under Section 564(b)(1) of the Act, 21 U.S.C. section 360bbb-3(b)(1), unless the authorization is terminated or revoked.  Performed at [facility], [place] [address] [place], [place] [address]              Oswald Hillock   [facility] If 7PM-7AM, please contact night-coverage at [email or web address], Office  [phone]   09/07/2020, 4:29 PM  LOS: 0 days

## 2020-09-07 NOTE — Progress Notes (Signed)
ANTICOAGULATION CONSULT NOTE  Pharmacy Consult for Heparin Indication: atrial fibrillation and mechanical valve  Allergies  Allergen Reactions   Morphine Hives and Rash   Oxycodone Hcl Hives and Itching   Dilaudid [Hydromorphone Hcl] Itching    Patient Measurements: Height: '5\' 4"'$  (162.6 cm) Weight: 104.8 kg (231 lb) IBW/kg (Calculated) : 54.7 HEPARIN DW (KG): 79.3   Vital Signs: Temp: 98.8 F (37.1 C) (08/21 0433) Temp Source: Oral (08/21 0433) BP: 149/79 (08/21 0433) Pulse Rate: 85 (08/21 0433)  Labs: Recent Labs    09/05/20 0004 09/05/20 1146 09/05/20 2202 09/05/20 2202 09/06/20 0300 09/06/20 0649 09/06/20 1316 09/06/20 2207 09/06/20 2338 09/07/20 0441 09/07/20 0451  HGB  --   --  8.8*   < > 7.7* 8.1*  --   --   --  7.7*  --   HCT  --   --  27.4*   < > 23.9* 24.8*  --   --   --  24.1*  --   PLT  --   --  340   < > 303 328  --   --   --  289  --   APTT  --   --  29  --   --   --   --   --   --   --   --   LABPROT  --   --  14.6  --   --  14.9  --   --   --   --   --   INR  --  1.3* 1.1  --   --  1.2  --   --   --   --   --   HEPARINUNFRC  --   --   --   --   --  0.23*   < > >1.10* 0.33  --  0.27*  CREATININE 1.14*  --   --   --   --  1.22*  --   --   --   --   --    < > = values in this interval not displayed.     Estimated Creatinine Clearance: 62.2 mL/min (A) (by C-G formula based on SCr of 1.22 mg/dL (H)).  Medications:  Warfarin '2mg'$  daily except '1mg'$  on MWF-->Held peri-op on 8/10 & resumed 8/19 with '3mg'$  x1 dose. Lovenox '100mg'$  Sq BID peri-op started on 8/10.  Held 8/15 doses for procedure then resumed 8/16.  Last doe 8/19 AM.  Assessment: 55 yo F on chronic warfarin for Afib with MVR.  She had surgery for ulnar nerve on 8/15 and presents today due to persistant oozing from wound. Her warfarin has been on hold since 8/10 & she has been bridging with Lovenox '1mg'$ /kg BID dosing.  She resumed Coumadin after visit to [facility]. Bleeding has subsided  after TXA administered topically.  However, due to low Hg patient is being admitted for observation with Pharmacy to dose IV heparin.  Today, 09/07/2020: Heparin level 0.27- now subtherapeutic on heparin infusion at 1050 units/hr Hgb 7.7-low, Plt stable WNL No infusion issues or active bleeding noted by RN  Goal of Therapy:  Heparin level 0.3-0.7 units/ml INR 2.5-3.5 Monitor platelets by anticoagulation protocol: Yes   Plan:  Increase heparin infusion to 1150 units/hr Recheck heparin level in 6hrs Daily heparin level & CBC while on heparin Monitor closely for s/sx of bleeding F/U plans to resume Lovenox/warfarin when Hg stable   Netta Cedars PharmD 09/07/2020,5:43 AM

## 2020-09-07 NOTE — Plan of Care (Signed)
Pt is injury free, afebrile, alert, and oriented x 4. VS within baseline. No signs or symptoms of bleeding. She denies chest pain, SOB, or acute changes during this shift. Will continue to monitor  Problem: Education: Goal: Knowledge of General Education information will improve Description: Including pain rating scale, medication(s)/side effects and non-pharmacologic comfort measures Outcome: Progressing   Problem: Clinical Measurements: Goal: Ability to maintain clinical measurements within normal limits will improve Outcome: Progressing Goal: Will remain free from infection Outcome: Progressing Goal: Diagnostic test results will improve Outcome: Progressing Goal: Respiratory complications will improve Outcome: Progressing Goal: Cardiovascular complication will be avoided Outcome: Progressing   Problem: Activity: Goal: Risk for activity intolerance will decrease Outcome: Progressing   Problem: Nutrition: Goal: Adequate nutrition will be maintained Outcome: Progressing   Problem: Coping: Goal: Level of anxiety will decrease Outcome: Progressing   Problem: Elimination: Goal: Will not experience complications related to bowel motility Outcome: Progressing Goal: Will not experience complications related to urinary retention Outcome: Progressing   Problem: Pain Managment: Goal: General experience of comfort will improve Outcome: Progressing   Problem: Safety: Goal: Ability to remain free from injury will improve Outcome: Progressing   Problem: Skin Integrity: Goal: Risk for impaired skin integrity will decrease Outcome: Progressing

## 2020-09-08 ENCOUNTER — Ambulatory Visit: Payer: 59

## 2020-09-08 DIAGNOSIS — I5032 Chronic diastolic (congestive) heart failure: Secondary | ICD-10-CM | POA: Diagnosis not present

## 2020-09-08 DIAGNOSIS — Z954 Presence of other heart-valve replacement: Secondary | ICD-10-CM | POA: Diagnosis not present

## 2020-09-08 DIAGNOSIS — D500 Iron deficiency anemia secondary to blood loss (chronic): Secondary | ICD-10-CM | POA: Diagnosis not present

## 2020-09-08 DIAGNOSIS — R791 Abnormal coagulation profile: Secondary | ICD-10-CM

## 2020-09-08 DIAGNOSIS — I480 Paroxysmal atrial fibrillation: Secondary | ICD-10-CM | POA: Diagnosis not present

## 2020-09-08 LAB — BPAM RBC
Blood Product Expiration Date: 202209172359
ISSUE DATE / TIME: 202208211526
Unit Type and Rh: 6200

## 2020-09-08 LAB — BASIC METABOLIC PANEL
Anion gap: 10 (ref 5–15)
BUN: 19 mg/dL (ref 6–20)
CO2: 29 mmol/L (ref 22–32)
Calcium: 9.1 mg/dL (ref 8.9–10.3)
Chloride: 97 mmol/L — ABNORMAL LOW (ref 98–111)
Creatinine, Ser: 1.34 mg/dL — ABNORMAL HIGH (ref 0.44–1.00)
GFR, Estimated: 47 mL/min — ABNORMAL LOW (ref >60)
Glucose, Bld: 135 mg/dL — ABNORMAL HIGH (ref 70–99)
Potassium: 3.3 mmol/L — ABNORMAL LOW (ref 3.5–5.1)
Sodium: 136 mmol/L (ref 135–145)

## 2020-09-08 LAB — CBC
HCT: 27.9 % — ABNORMAL LOW (ref 36.0–46.0)
Hemoglobin: 9.1 g/dL — ABNORMAL LOW (ref 12.0–15.0)
MCH: 29.2 pg (ref 26.0–34.0)
MCHC: 32.6 g/dL (ref 30.0–36.0)
MCV: 89.4 fL (ref 80.0–100.0)
Platelets: 297 10*3/uL (ref 150–400)
RBC: 3.12 MIL/uL — ABNORMAL LOW (ref 3.87–5.11)
RDW: 15.8 % — ABNORMAL HIGH (ref 11.5–15.5)
WBC: 10.9 10*3/uL — ABNORMAL HIGH (ref 4.0–10.5)
nRBC: 0 % (ref 0.0–0.2)

## 2020-09-08 LAB — TYPE AND SCREEN
ABO/RH(D): A POS
Antibody Screen: NEGATIVE
Unit division: 0

## 2020-09-08 LAB — PROTIME-INR
INR: 1.1 (ref 0.8–1.2)
Prothrombin Time: 14.6 seconds (ref 11.4–15.2)

## 2020-09-08 MED ORDER — WARFARIN SODIUM 3 MG PO TABS
3.0000 mg | ORAL_TABLET | Freq: Once | ORAL | Status: DC
  Filled 2020-09-08: qty 1

## 2020-09-08 MED ORDER — POTASSIUM CHLORIDE CRYS ER 10 MEQ PO TBCR
40.0000 meq | EXTENDED_RELEASE_TABLET | Freq: Once | ORAL | Status: AC
  Administered 2020-09-08: 40 meq via ORAL
  Filled 2020-09-08: qty 4

## 2020-09-08 NOTE — Progress Notes (Signed)
Orthopedic Tech Progress Note Patient Details:  Leslie [name] 1965-08-08 [id number]  Ortho Devices Type of Ortho Device: Ace wrap, Post (long arm) splint Ortho Device/Splint Location: left Long arm splint replacement Ortho Device/Splint Interventions: Application   Post Interventions Patient Tolerated: Well Instructions Provided: Care of device  [place] Pink 09/08/2020, 11:35 AM

## 2020-09-08 NOTE — Discharge Summary (Signed)
Physician Discharge Summary  Leslie [name] [id number] DOB: April 12, 1965 DOA: 09/05/2020  PCP: Lavera Guise, MD  Admit date: 09/05/2020 Discharge date: 09/08/2020  Time spent: 60 minutes  Recommendations for Outpatient Follow-up:  Follow-up hand surgeon Dr. Apolonio Schneiders as outpatient Follow-up cardiology clinic on Wednesday to check PT/INR   Discharge Diagnoses:  Principal Problem:   Blood loss anemia Active Problems:   Atrial fibrillation (HCC)   (HFpEF) heart failure with preserved ejection fraction (HCC)   CKD (chronic kidney disease), stage III (HCC)   S/P mitral valve replacement with Onyx bileaflet mechanical valve   Bleeding   Discharge Condition: Stable  Diet recommendation: Heart healthy diet  Filed Weights   09/05/20 2120  Weight: 104.8 kg    History of present illness:  55 year old female with medical history of chronic diastolic CHF, anemia, anxiety, GERD, hypertension, hyperlipidemia, ILD, mitral stenosis status post mitral valve replacement on chronic Coumadin with goal of INR 2.5-3.5.  Atrial fibrillation status post maze procedure, CKD stage III, obesity recently had left upper extremity ulnar nerve surgery done on 8/15 by Dr. Gavin Pound which was complicated by postop bleeding and was seen Dr. Lequita Asal office on 3 consulted days.  Dressing change in office on 8/19 showed bleeding so she was sent to ED for ongoing surgical site bleeding.  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 -Dr. Algis Liming  contacted Dr. Lyla Glassing, orthopedics who recommended to call Dr. Apolonio Schneiders -Dr. Apolonio Schneiders advised not to remove dressing at this time -Patient to follow Dr. Apolonio Schneiders as outpatient   Acute blood loss anemia -Hemoglobin  dropped to 7.7 -Her usual hemoglobin is 8-9 range -S/p 1 unit PRBC  -This morning hemoglobin is 9.1    Hypokalemia -Potassium is 3.3 this morning -We will give K. Dur 40 mEq p.o. x1 before discharge -Continue potassium supplementation at home  Mitral stenosis status post mechanical valve replacement/paroxysmal A. Fib -Patient is on Coumadin for atrial fibrillation and mechanical mitral valve -Coumadin was stopped 5 days prior to surgery -She was on Lovenox bridge along with Coumadin -EDP called cardiologist who used recommended starting IV heparin due to ongoing bleeding -As patient's bleeding has stopped we will transition to Lovenox bridge along with Coumadin -continue taking Lovenox 100 mg   Chronic diastolic CHF -continue home medications   CKD stage IIIa -Creatinine at baseline   Hypertension -Continue metoprolol    Procedures:   Consultations:   Discharge Exam: Vitals:   09/08/20 0443 09/08/20 1002  BP: 138/84 (!) 134/53  Pulse: 83 89  Resp: 20   Temp: 99.4 F (37.4 C)   SpO2: 99%     General: Appears in no acute distress Cardiovascular: S1S2 RRR Respiratory: Clear bilaterally  Discharge Instructions   Discharge Instructions     Diet - low sodium heart healthy   Complete by: As directed    Increase activity slowly   Complete by: As directed    No wound care   Complete by: As directed       Allergies as of 09/08/2020       Reactions   Morphine Hives, Rash   Oxycodone Hcl Hives, Itching   Dilaudid [hydromorphone Hcl] Itching        Medication List     STOP taking these medications  acetaminophen 500 MG tablet Commonly known as: TYLENOL       TAKE these medications    albuterol 108 (90 Base) MCG/ACT inhaler Commonly known as: VENTOLIN HFA Inhale 2 puffs into the lungs every 6 (six) hours as needed for wheezing or shortness of breath.   ALPRAZolam 0.5 MG tablet Commonly known as: XANAX Take 0.5 tablets (0.25 mg total) by  mouth at bedtime as needed for anxiety.   amLODipine 10 MG tablet Commonly known as: NORVASC Take 1 tablet (10 mg total) by mouth daily.   atorvastatin 80 MG tablet Commonly known as: LIPITOR TAKE 1 TABLET BY MOUTH EVERY DAY AT 6 PM   calcium carbonate 500 MG chewable tablet Commonly known as: TUMS - dosed in mg elemental calcium Chew 500 mg by mouth daily as needed for indigestion or heartburn.   enoxaparin 100 MG/ML injection Commonly known as: LOVENOX Inject 1 mL (100 mg total) into the skin every 12 (twelve) hours.   ferrous [id number] C-folic acid capsule Commonly known as: TRINSICON / FOLTRIN Take 1 capsule by mouth 2 (two) times daily after a meal.   fluticasone 50 MCG/ACT nasal spray Commonly known as: FLONASE Place 1 spray into both nostrils daily as needed for rhinitis.   HYDROcodone-acetaminophen 10-325 MG tablet Commonly known as: NORCO Take 1 tablet by mouth every 6 (six) hours as needed.   metoprolol succinate 50 MG 24 hr tablet Commonly known as: TOPROL-XL Take 1 tablet (50 mg) by mouth in the morning & take 2 tablets (100 mg) by mouth in the evening. Take with or immediately following a meal.   omeprazole 40 MG capsule Commonly known as: PRILOSEC Take 1 capsule (40 mg total) by mouth daily.   oxyCODONE-acetaminophen 10-325 MG tablet Commonly known as: PERCOCET Take 1 tablet by mouth every 6 (six) hours as needed.   potassium chloride SA 20 MEQ tablet Commonly known as: KLOR-CON Take 1 tablet (20 mEq total) by mouth 2 (two) times daily.   spironolactone 25 MG tablet Commonly known as: ALDACTONE Take 0.5 tablets (12.5 mg total) by mouth daily.   torsemide 20 MG tablet Commonly known as: DEMADEX TAKE 2 TABLETS(40 MG) BY MOUTH TWICE DAILY   warfarin 2 MG tablet Commonly known as: COUMADIN Take as directed. If you are unsure how to take this medication, talk to your nurse or doctor. Original instructions: TAKE 1 TABLET BY MOUTH DAILY AT  4PM AS INSTRUCTED BY COUMADIN CLINIC FOR LIFETIME What changed: See the new instructions.       Allergies  Allergen Reactions   Morphine Hives and Rash   Oxycodone Hcl Hives and Itching   Dilaudid [Hydromorphone Hcl] Itching      The results of significant diagnostics from this hospitalization (including imaging, microbiology, ancillary and laboratory) are listed below for reference.    Significant Diagnostic Studies: No results found.  Microbiology: Recent Results (from the past 240 hour(s))  Resp Panel by RT-PCR (Flu A&B, Covid) Nasopharyngeal Swab     Status: None   Collection Time: 09/06/20  3:26 AM   Specimen: Nasopharyngeal Swab; Nasopharyngeal(NP) swabs in vial transport medium  Result Value Ref Range Status   SARS Coronavirus 2 by RT PCR NEGATIVE NEGATIVE Final    Comment: (NOTE) SARS-CoV-2 target nucleic acids are NOT DETECTED.  The SARS-CoV-2 RNA is generally detectable in upper respiratory specimens during the acute phase of infection. The lowest concentration of SARS-CoV-2 viral copies this assay can detect is 138 copies/mL. A negative result does not preclude  SARS-Cov-2 infection and should not be used as the sole basis for treatment or other patient management decisions. A negative result may occur with  improper specimen collection/handling, submission of specimen other than nasopharyngeal swab, presence of viral mutation(s) within the areas targeted by this assay, and inadequate number of viral copies(<138 copies/mL). A negative result must be combined with clinical observations, patient history, and epidemiological information. The expected result is Negative.  Fact Sheet for Patients:  EntrepreneurPulse.com.au  Fact Sheet for Healthcare Providers:  IncredibleEmployment.be  This test is no t yet approved or cleared by the [place] FDA and  has been authorized for detection and/or diagnosis of SARS-CoV-2 by FDA  under an Emergency Use Authorization (EUA). This EUA will remain  in effect (meaning this test can be used) for the duration of the COVID-19 declaration under Section 564(b)(1) of the Act, 21 U.S.C.section 360bbb-3(b)(1), unless the authorization is terminated  or revoked sooner.       Influenza A by PCR NEGATIVE NEGATIVE Final   Influenza B by PCR NEGATIVE NEGATIVE Final    Comment: (NOTE) The Xpert Xpress SARS-CoV-2/FLU/RSV plus assay is intended as an aid in the diagnosis of influenza from Nasopharyngeal swab specimens and should not be used as a sole basis for treatment. Nasal washings and aspirates are unacceptable for Xpert Xpress SARS-CoV-2/FLU/RSV testing.  Fact Sheet for Patients: EntrepreneurPulse.com.au  Fact Sheet for Healthcare Providers: IncredibleEmployment.be  This test is not yet approved or cleared by the [place] FDA and has been authorized for detection and/or diagnosis of SARS-CoV-2 by FDA under an Emergency Use Authorization (EUA). This EUA will remain in effect (meaning this test can be used) for the duration of the COVID-19 declaration under Section 564(b)(1) of the Act, 21 U.S.C. section 360bbb-3(b)(1), unless the authorization is terminated or revoked.  Performed at [facility], [place] [address] [place], [place] [address]      Labs: Basic Metabolic Panel: Recent Labs  Lab 09/05/20 0004 09/06/20 0649 09/07/20 0441 09/08/20 0439  NA 135 136 135 136  K 3.2* 3.3* 3.1* 3.3*  CL 97* 97* 99 97*  CO2 '28 28 27 29  '$ GLUCOSE 126* 123* 143* 135*  BUN 19 19 21* 19  CREATININE 1.14* 1.22* 1.25* 1.34*  CALCIUM 9.0 8.8* 9.0 9.1  MG  --  2.1  --   --    Liver Function Tests: Recent Labs  Lab 09/06/20 0649  AST 25  ALT 32  ALKPHOS 116  BILITOT 0.5  PROT 7.8  ALBUMIN 3.8   No results for input(s): LIPASE, AMYLASE in the last 168 hours. No results for input(s): AMMONIA in the last 168  hours. CBC: Recent Labs  Lab 09/05/20 2202 09/06/20 0300 09/06/20 0649 09/07/20 0441 09/08/20 0439  WBC 9.7 9.3 9.3 9.4 10.9*  NEUTROABS 6.4  --   --   --   --   HGB 8.8* 7.7* 8.1* 7.7* 9.1*  HCT 27.4* 23.9* 24.8* 24.1* 27.9*  MCV 91.0 90.9 92.2 93.1 89.4  PLT 340 303 328 289 297   Cardiac Enzymes: No results for input(s): CKTOTAL, CKMB, CKMBINDEX, TROPONINI in the last 168 hours. BNP: BNP (last 3 results) Recent Labs    06/02/20 1149  BNP 116.6*    ProBNP (last 3 results) No results for input(s): PROBNP in the last 8760 hours.  CBG: No results for input(s): GLUCAP in the last 168 hours.     Signed:  Oswald Hillock MD.  [facility] 09/08/2020, 12:50 PM

## 2020-09-08 NOTE — Progress Notes (Signed)
ANTICOAGULATION CONSULT NOTE  Pharmacy Consult for Lovenox/warfarin Indication: Mechanical mitral valve, afib  Allergies  Allergen Reactions   Morphine Hives and Rash   Oxycodone Hcl Hives and Itching   Dilaudid [Hydromorphone Hcl] Itching    Patient Measurements: Height: '5\' 4"'$  (162.6 cm) Weight: 104.8 kg (231 lb) IBW/kg (Calculated) : 54.7 HEPARIN DW (KG): 79.3   Vital Signs: Temp: 99.4 F (37.4 C) (08/22 0443) Temp Source: Oral (08/22 0443) BP: 138/84 (08/22 0443) Pulse Rate: 83 (08/22 0443)  Labs: Recent Labs    09/05/20 2202 09/06/20 0300 09/06/20 0649 09/06/20 1316 09/06/20 2207 09/06/20 2338 09/07/20 0441 09/07/20 0451 09/08/20 0439  HGB 8.8*   < > 8.1*  --   --   --  7.7*  --  9.1*  HCT 27.4*   < > 24.8*  --   --   --  24.1*  --  27.9*  PLT 340   < > 328  --   --   --  289  --  297  APTT 29  --   --   --   --   --   --   --   --   LABPROT 14.6  --  14.9  --   --   --   --   --  14.6  INR 1.1  --  1.2  --   --   --   --   --  1.1  HEPARINUNFRC  --   --  0.23*   < > >1.10* 0.33  --  0.27*  --   CREATININE  --   --  1.22*  --   --   --  1.25*  --  1.34*   < > = values in this interval not displayed.     Estimated Creatinine Clearance: 56.6 mL/min (A) (by C-G formula based on SCr of 1.34 mg/dL (H)).  Medications: Pt on warfarin chronically for mechanical valve  Per [facility] clinic note on 09/05/20: Home dose: Warfarin 2 mg MWF, 1 mg all other days of the week INR goal: 2.5 - 3.5  Peri-operative bridging history: -Warfarin was held peri-operatively starting on 8/10 & resumed post-op on 8/19 with '3mg'$  x1 dose. -Lovenox '100mg'$  Sq BID peri-op started on 8/10.  Held 8/15 doses for procedure then resumed 8/16.  Last dose 8/19 AM PTA. -On hospital admission, bridged with IV UFH starting on 8/20, transitioned back to therapeutic LMWH on 8/21  Assessment: 55 yo F on chronic warfarin for Afib and mechanical mitral valve.  She underwent surgery for ulnar nerve on 8/15  and presented to the ED on 8/19 due to persistent oozing from surgical site. Bleeding resolved with topical txa; transfused on 8/21.   Patient was being bridged with therapeutic LMWH while INR subtherapeutic prior to admission; pharmacy consulted to continue inpatient.  Today, 09/08/2020: INR = 1.1 remains subtherapeutic as expected since warfarin has been held CBC: Hgb (9.1) increased s/p transfusion; Plt WNL TBW = 104 kg SCr 1.34, CrCl ~57 mL/min Diet: Heart healthy; 100% meal intake charted No major DDI noted  Goal of Therapy:  INR 2.5 - 3.5 Monitor platelets by anticoagulation protocol: Yes   Plan:  Continue enoxaparin '100mg'$  SubQ q12h Warfarin 3 mg PO once today Daily INR and CBC Monitor closely for s/sx of bleeding  Due to mechanical mitral valve, pt will require anticoagulation bridge while INR subtherapeutic.   At this time, would recommend discharging on home warfarin dose with therapeutic enoxaparin bridge and  INR clinic check/appointment within 72 hours to guide further dosing.   Lenis Noon, PharmD 09/08/20 8:29 AM

## 2020-09-08 NOTE — Progress Notes (Signed)
Went over discharge instructions w/ pt and her husband Leslie [name]). They verbalized understanding.

## 2020-09-08 NOTE — Progress Notes (Signed)
Physician Discharge Summary  Leslie [name] [id number] DOB: November 19, 1965 DOA: 09/05/2020  PCP: Lavera Guise, MD  Admit date: 09/05/2020 Discharge date: 09/08/2020  Time spent: 60 minutes  Recommendations for Outpatient Follow-up:  Follow-up hand surgeon Dr. Apolonio Schneiders as outpatient Follow-up cardiology clinic on Wednesday to check PT/INR   Discharge Diagnoses:  Principal Problem:   Blood loss anemia Active Problems:   Atrial fibrillation (HCC)   (HFpEF) heart failure with preserved ejection fraction (HCC)   CKD (chronic kidney disease), stage III (HCC)   S/P mitral valve replacement with Onyx bileaflet mechanical valve   Bleeding   Discharge Condition: Stable  Diet recommendation: Heart healthy diet  Filed Weights   09/05/20 2120  Weight: 104.8 kg    History of present illness:  55 year old female with medical history of chronic diastolic CHF, anemia, anxiety, GERD, hypertension, hyperlipidemia, ILD, mitral stenosis status post mitral valve replacement on chronic Coumadin with goal of INR 2.5-3.5.  Atrial fibrillation status post maze procedure, CKD stage III, obesity recently had left upper extremity ulnar nerve surgery done on 8/15 by Dr. Gavin Pound which was complicated by postop bleeding and was seen Dr. Lequita Asal office on 3 consulted days.  Dressing change in office on 8/19 showed bleeding so she was sent to ED for ongoing surgical site bleeding.  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 -Dr. Algis Liming  contacted Dr. Lyla Glassing, orthopedics who recommended to call Dr. Apolonio Schneiders -Dr. Apolonio Schneiders advised not to remove dressing at this time -Patient to follow Dr. Apolonio Schneiders as outpatient   Acute blood loss anemia -Hemoglobin  dropped to 7.7 -Her usual hemoglobin is 8-9 range -S/p 1 unit PRBC  -This morning hemoglobin is 9.1    Hypokalemia -Potassium is 3.3 this morning -We will give K. Dur 40 mEq p.o. x1 before discharge -Continue potassium supplementation at home  Mitral stenosis status post mechanical valve replacement/paroxysmal A. Fib -Patient is on Coumadin for atrial fibrillation and mechanical mitral valve -Coumadin was stopped 5 days prior to surgery -She was on Lovenox bridge along with Coumadin -EDP called cardiologist who used recommended starting IV heparin due to ongoing bleeding -As patient's bleeding has stopped we will transition to Lovenox bridge along with Coumadin -continue taking Lovenox 100 mg   Chronic diastolic CHF -continue home medications   CKD stage IIIa -Creatinine at baseline   Hypertension -Continue metoprolol    Procedures:   Consultations:   Discharge Exam: Vitals:   09/08/20 0443 09/08/20 1002  BP: 138/84 (!) 134/53  Pulse: 83 89  Resp: 20   Temp: 99.4 F (37.4 C)   SpO2: 99%     General: Appears in no acute distress Cardiovascular: S1S2 RRR Respiratory: Clear bilaterally  Discharge Instructions   Discharge Instructions     Diet - low sodium heart healthy   Complete by: As directed    Increase activity slowly   Complete by: As directed    No wound care   Complete by: As directed       Allergies as of 09/08/2020       Reactions   Morphine Hives, Rash   Oxycodone Hcl Hives, Itching   Dilaudid [hydromorphone Hcl] Itching        Medication List     STOP taking these medications  acetaminophen 500 MG tablet Commonly known as: TYLENOL       TAKE these medications    albuterol 108 (90 Base) MCG/ACT inhaler Commonly known as: VENTOLIN HFA Inhale 2 puffs into the lungs every 6 (six) hours as needed for wheezing or shortness of breath.   ALPRAZolam 0.5 MG tablet Commonly known as: XANAX Take 0.5 tablets (0.25 mg total) by  mouth at bedtime as needed for anxiety.   amLODipine 10 MG tablet Commonly known as: NORVASC Take 1 tablet (10 mg total) by mouth daily.   atorvastatin 80 MG tablet Commonly known as: LIPITOR TAKE 1 TABLET BY MOUTH EVERY DAY AT 6 PM   calcium carbonate 500 MG chewable tablet Commonly known as: TUMS - dosed in mg elemental calcium Chew 500 mg by mouth daily as needed for indigestion or heartburn.   enoxaparin 100 MG/ML injection Commonly known as: LOVENOX Inject 1 mL (100 mg total) into the skin every 12 (twelve) hours.   ferrous [id number] C-folic acid capsule Commonly known as: TRINSICON / FOLTRIN Take 1 capsule by mouth 2 (two) times daily after a meal.   fluticasone 50 MCG/ACT nasal spray Commonly known as: FLONASE Place 1 spray into both nostrils daily as needed for rhinitis.   HYDROcodone-acetaminophen 10-325 MG tablet Commonly known as: NORCO Take 1 tablet by mouth every 6 (six) hours as needed.   metoprolol succinate 50 MG 24 hr tablet Commonly known as: TOPROL-XL Take 1 tablet (50 mg) by mouth in the morning & take 2 tablets (100 mg) by mouth in the evening. Take with or immediately following a meal.   omeprazole 40 MG capsule Commonly known as: PRILOSEC Take 1 capsule (40 mg total) by mouth daily.   oxyCODONE-acetaminophen 10-325 MG tablet Commonly known as: PERCOCET Take 1 tablet by mouth every 6 (six) hours as needed.   potassium chloride SA 20 MEQ tablet Commonly known as: KLOR-CON Take 1 tablet (20 mEq total) by mouth 2 (two) times daily.   spironolactone 25 MG tablet Commonly known as: ALDACTONE Take 0.5 tablets (12.5 mg total) by mouth daily.   torsemide 20 MG tablet Commonly known as: DEMADEX TAKE 2 TABLETS(40 MG) BY MOUTH TWICE DAILY   warfarin 2 MG tablet Commonly known as: COUMADIN Take as directed. If you are unsure how to take this medication, talk to your nurse or doctor. Original instructions: TAKE 1 TABLET BY MOUTH DAILY AT  4PM AS INSTRUCTED BY COUMADIN CLINIC FOR LIFETIME What changed: See the new instructions.       Allergies  Allergen Reactions   Morphine Hives and Rash   Oxycodone Hcl Hives and Itching   Dilaudid [Hydromorphone Hcl] Itching      The results of significant diagnostics from this hospitalization (including imaging, microbiology, ancillary and laboratory) are listed below for reference.    Significant Diagnostic Studies: No results found.  Microbiology: Recent Results (from the past 240 hour(s))  Resp Panel by RT-PCR (Flu A&B, Covid) Nasopharyngeal Swab     Status: None   Collection Time: 09/06/20  3:26 AM   Specimen: Nasopharyngeal Swab; Nasopharyngeal(NP) swabs in vial transport medium  Result Value Ref Range Status   SARS Coronavirus 2 by RT PCR NEGATIVE NEGATIVE Final    Comment: (NOTE) SARS-CoV-2 target nucleic acids are NOT DETECTED.  The SARS-CoV-2 RNA is generally detectable in upper respiratory specimens during the acute phase of infection. The lowest concentration of SARS-CoV-2 viral copies this assay can detect is 138 copies/mL. A negative result does not preclude  SARS-Cov-2 infection and should not be used as the sole basis for treatment or other patient management decisions. A negative result may occur with  improper specimen collection/handling, submission of specimen other than nasopharyngeal swab, presence of viral mutation(s) within the areas targeted by this assay, and inadequate number of viral copies(<138 copies/mL). A negative result must be combined with clinical observations, patient history, and epidemiological information. The expected result is Negative.  Fact Sheet for Patients:  EntrepreneurPulse.com.au  Fact Sheet for Healthcare Providers:  IncredibleEmployment.be  This test is no t yet approved or cleared by the [place] FDA and  has been authorized for detection and/or diagnosis of SARS-CoV-2 by FDA  under an Emergency Use Authorization (EUA). This EUA will remain  in effect (meaning this test can be used) for the duration of the COVID-19 declaration under Section 564(b)(1) of the Act, 21 U.S.C.section 360bbb-3(b)(1), unless the authorization is terminated  or revoked sooner.       Influenza A by PCR NEGATIVE NEGATIVE Final   Influenza B by PCR NEGATIVE NEGATIVE Final    Comment: (NOTE) The Xpert Xpress SARS-CoV-2/FLU/RSV plus assay is intended as an aid in the diagnosis of influenza from Nasopharyngeal swab specimens and should not be used as a sole basis for treatment. Nasal washings and aspirates are unacceptable for Xpert Xpress SARS-CoV-2/FLU/RSV testing.  Fact Sheet for Patients: EntrepreneurPulse.com.au  Fact Sheet for Healthcare Providers: IncredibleEmployment.be  This test is not yet approved or cleared by the [place] FDA and has been authorized for detection and/or diagnosis of SARS-CoV-2 by FDA under an Emergency Use Authorization (EUA). This EUA will remain in effect (meaning this test can be used) for the duration of the COVID-19 declaration under Section 564(b)(1) of the Act, 21 U.S.C. section 360bbb-3(b)(1), unless the authorization is terminated or revoked.  Performed at [facility], [place] [address] [place], [place] [address]      Labs: Basic Metabolic Panel: Recent Labs  Lab 09/05/20 0004 09/06/20 0649 09/07/20 0441 09/08/20 0439  NA 135 136 135 136  K 3.2* 3.3* 3.1* 3.3*  CL 97* 97* 99 97*  CO2 '28 28 27 29  '$ GLUCOSE 126* 123* 143* 135*  BUN 19 19 21* 19  CREATININE 1.14* 1.22* 1.25* 1.34*  CALCIUM 9.0 8.8* 9.0 9.1  MG  --  2.1  --   --    Liver Function Tests: Recent Labs  Lab 09/06/20 0649  AST 25  ALT 32  ALKPHOS 116  BILITOT 0.5  PROT 7.8  ALBUMIN 3.8   No results for input(s): LIPASE, AMYLASE in the last 168 hours. No results for input(s): AMMONIA in the last 168  hours. CBC: Recent Labs  Lab 09/05/20 2202 09/06/20 0300 09/06/20 0649 09/07/20 0441 09/08/20 0439  WBC 9.7 9.3 9.3 9.4 10.9*  NEUTROABS 6.4  --   --   --   --   HGB 8.8* 7.7* 8.1* 7.7* 9.1*  HCT 27.4* 23.9* 24.8* 24.1* 27.9*  MCV 91.0 90.9 92.2 93.1 89.4  PLT 340 303 328 289 297   Cardiac Enzymes: No results for input(s): CKTOTAL, CKMB, CKMBINDEX, TROPONINI in the last 168 hours. BNP: BNP (last 3 results) Recent Labs    06/02/20 1149  BNP 116.6*    ProBNP (last 3 results) No results for input(s): PROBNP in the last 8760 hours.  CBG: No results for input(s): GLUCAP in the last 168 hours.     Signed:  Oswald Hillock MD.  [facility] 09/08/2020, 11:19 AM

## 2020-09-10 ENCOUNTER — Ambulatory Visit (INDEPENDENT_AMBULATORY_CARE_PROVIDER_SITE_OTHER): Payer: 59

## 2020-09-10 ENCOUNTER — Other Ambulatory Visit: Payer: Self-pay

## 2020-09-10 ENCOUNTER — Ambulatory Visit: Payer: 59

## 2020-09-10 DIAGNOSIS — I4891 Unspecified atrial fibrillation: Secondary | ICD-10-CM

## 2020-09-10 DIAGNOSIS — I050 Rheumatic mitral stenosis: Secondary | ICD-10-CM

## 2020-09-10 DIAGNOSIS — Z5181 Encounter for therapeutic drug level monitoring: Secondary | ICD-10-CM

## 2020-09-10 DIAGNOSIS — R7989 Other specified abnormal findings of blood chemistry: Secondary | ICD-10-CM | POA: Diagnosis not present

## 2020-09-10 LAB — POCT INR: INR: 1.1 — AB (ref 2.0–3.0)

## 2020-09-10 NOTE — Patient Instructions (Signed)
-   Continue lovenox injections twice a day.  - Take 1.5 tablets of warfarin today and tomorrow, then  - continue to take warfarin 1/2 a tablet daily except for 1 tablet on Monday, Wednesday and Fridays.  - Recheck INR in 1 week

## 2020-09-11 ENCOUNTER — Ambulatory Visit: Payer: 59

## 2020-09-15 ENCOUNTER — Ambulatory Visit: Payer: 59

## 2020-09-17 ENCOUNTER — Ambulatory Visit (INDEPENDENT_AMBULATORY_CARE_PROVIDER_SITE_OTHER): Payer: 59

## 2020-09-17 ENCOUNTER — Other Ambulatory Visit: Payer: Self-pay

## 2020-09-17 ENCOUNTER — Ambulatory Visit: Payer: 59

## 2020-09-17 DIAGNOSIS — Z5181 Encounter for therapeutic drug level monitoring: Secondary | ICD-10-CM

## 2020-09-17 DIAGNOSIS — I4891 Unspecified atrial fibrillation: Secondary | ICD-10-CM

## 2020-09-17 DIAGNOSIS — I050 Rheumatic mitral stenosis: Secondary | ICD-10-CM

## 2020-09-17 LAB — POCT INR: INR: 1.6 — AB (ref 2.0–3.0)

## 2020-09-17 NOTE — Patient Instructions (Addendum)
-   Continue lovenox injections twice a day.  - Take 2 tablets of warfarin today and tomorrow, then  - START NEW DOSAGE warfarin 1 tablet daily except for 1/2 tablet on SUNDAYS & THURSDAYS - Recheck INR in 1 week

## 2020-09-18 ENCOUNTER — Ambulatory Visit: Payer: 59

## 2020-09-19 ENCOUNTER — Encounter: Payer: Self-pay | Admitting: Nurse Practitioner

## 2020-09-19 ENCOUNTER — Other Ambulatory Visit: Payer: Self-pay

## 2020-09-19 ENCOUNTER — Ambulatory Visit (INDEPENDENT_AMBULATORY_CARE_PROVIDER_SITE_OTHER): Payer: 59 | Admitting: Nurse Practitioner

## 2020-09-19 ENCOUNTER — Ambulatory Visit (INDEPENDENT_AMBULATORY_CARE_PROVIDER_SITE_OTHER): Payer: 59

## 2020-09-19 VITALS — BP 140/76 | HR 78 | Ht 64.0 in | Wt 234.0 lb

## 2020-09-19 DIAGNOSIS — I480 Paroxysmal atrial fibrillation: Secondary | ICD-10-CM

## 2020-09-19 DIAGNOSIS — I10 Essential (primary) hypertension: Secondary | ICD-10-CM | POA: Diagnosis not present

## 2020-09-19 DIAGNOSIS — I5032 Chronic diastolic (congestive) heart failure: Secondary | ICD-10-CM

## 2020-09-19 DIAGNOSIS — E782 Mixed hyperlipidemia: Secondary | ICD-10-CM

## 2020-09-19 DIAGNOSIS — Z954 Presence of other heart-valve replacement: Secondary | ICD-10-CM

## 2020-09-19 MED ORDER — METOPROLOL SUCCINATE ER 100 MG PO TB24: 100.0000 mg | ORAL_TABLET | Freq: Two times a day (BID) | ORAL | 3 refills | Status: DC

## 2020-09-19 NOTE — Progress Notes (Signed)
Office Visit    Patient Name: Leslie [name] Date of Encounter: 09/19/2020  Primary Care Provider:  Lavera Guise, MD Primary Cardiologist:  Ida Rogue, MD  Chief Complaint    55 y.o female with past medical history of HFpEF, HTN, hyperlipidemia, interstitial lung disease, mitral valve stenosis s/p mech MVR, and paroxysmal atrial fibrillation, who presents for f/u for atrial fibrillation and mitral valve stenosis s/p mechanical MVR.   Past Medical History    Past Medical History:  Diagnosis Date   (HFpEF) heart failure with preserved ejection fraction ([place])    a. 08/2017 Echo: EF 55-60%.  Grade 2 diastolic dysfunction; b. 06/9483 Echo: EF 50-55%, no rwma, Nl RV fxn, nl fxn'ing mech MV.   Allergy    Anemia    Anxiety    BRCA negative 03/22/2013   Bronchitis 02/19/2016   ON LEVAQUIN PO   Chest tightness    a. 08/2019 Cath: nl cors.   Cigarette smoker 09/11/2017   8-10 day   Dyspnea    Dysrhythmia    GERD (gastroesophageal reflux disease)    History of kidney stones    Hyperlipidemia    Hypertension    Interstitial lung disease ([place])    a. CT 2013 b. 02/2018 CXR noted recurrent intersistial changes ILD vs chronic bronchitis   Moderate mitral stenosis    a.  08/2017 TEE: EF 60 to 65%.  Moderate mitral stenosis.  Mean gradient 14 mmHg.  Valve area 2.59 cm by planimetry, 2.72 cm by pressure half-time.   PAF (paroxysmal atrial fibrillation) ([place])    a. 08/2017 s/p TEE/DCCV; b. CHA2DS2VASc = 2-->warfarin.   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 04/2020 s/p 27/29 mm Onyx mech mitral valve-->chronic coumadin; b. 05/2020 Echo: Nl fxn'ing mech MV.   Past Surgical History:  Procedure Laterality Date   ABDOMINAL HYSTERECTOMY     total   ABDOMINAL  HYSTERECTOMY     BREAST BIOPSY Bilateral 2012   BREAST BIOPSY Right 08-07-12   fibroadenomatous changes and columnar cells   BREAST BIOPSY  02/03/2015   stereo byrnett   BUBBLE STUDY  04/01/2020   Procedure: BUBBLE STUDY;  Surgeon: [name], MD;  Location: [place];  Service: Cardiovascular;;   CARDIAC CATHETERIZATION     CHOLECYSTECTOMY N/A 02/27/2016   Procedure: LAPAROSCOPIC CHOLECYSTECTOMY;  Surgeon: Christene Lye, MD;  Location: [facility] ORS;  Service: General;  Laterality: N/A;   CLIPPING OF ATRIAL APPENDAGE  04/29/2020   Procedure: CLIPPING OF ATRIAL APPENDAGE USING ATRICURE  PRO2 CLIP SIZE 45MM;  Surgeon: Rexene Alberts, MD;  Location: [facility] OR;  Service: Open Heart Surgery;;   COLONOSCOPY WITH PROPOFOL N/A 09/26/2018   Procedure: COLONOSCOPY WITH PROPOFOL;  Surgeon: Lucilla Lame, MD;  Location[facility];  Service: Endoscopy;  Laterality: N/A;   CYSTOSCOPY W/ RETROGRADES Right 08/18/2018   Procedure: CYSTOSCOPY WITH RETROGRADE PYELOGRAM;  Surgeon: Billey Co, MD;  Location: [facility];  Service: Urology;  Laterality: Right;   CYSTOSCOPY/URETEROSCOPY/HOLMIUM LASER/STENT PLACEMENT Right 08/18/2018   Procedure: CYSTOSCOPY/URETEROSCOPY/STENT PLACEMENT;  Surgeon: Billey Co, MD;  Location: [facility];  Service: Urology;  Laterality: Right;   DIAGNOSTIC LAPAROSCOPY     ESOPHAGOGASTRODUODENOSCOPY (EGD) WITH PROPOFOL N/A 09/26/2018   Procedure: ESOPHAGOGASTRODUODENOSCOPY (EGD) WITH PROPOFOL;  Surgeon: Allen Norris,  Darren, MD;  Location: [place] ENDOSCOPY;  Service: Endoscopy;  Laterality: N/A;   GIVENS CAPSULE STUDY N/A 11/03/2018   Procedure: GIVENS CAPSULE STUDY;  Surgeon: Lucilla Lame, MD;  Location: [facility] ENDOSCOPY;  Service: Endoscopy;  Laterality: N/A;   JOINT REPLACEMENT Left    knee   KNEE ARTHROPLASTY Right 04/09/2019   Procedure: COMPUTER ASSISTED TOTAL KNEE ARTHROPLASTY;  Surgeon: Dereck Leep, MD;  Location: [facility];  Service: Orthopedics;  Laterality: Right;   KNEE  CLOSED REDUCTION Left 04/15/2015   Procedure: CLOSED MANIPULATION KNEE;  Surgeon: Thornton Park, MD;  Location: [facility];  Service: Orthopedics;  Laterality: Left;   KNEE SURGERY     MAZE N/A 04/29/2020   Procedure: MAZE;  Surgeon: Rexene Alberts, MD;  Location: [place];  Service: Open Heart Surgery;  Laterality: N/A;   MITRAL VALVE REPLACEMENT N/A 04/29/2020   Procedure: MITRAL VALVE (MV) REPLACEMENT USING ON-X VALVE SIZE 27/29MM;  Surgeon: Rexene Alberts, MD;  Location: [place];  Service: Open Heart Surgery;  Laterality: N/A;   MULTIPLE EXTRACTIONS WITH ALVEOLOPLASTY N/A 02/28/2020   Procedure: MULTIPLE EXTRACTION WITH ALVEOLOPLASTY;  Surgeon: [name], DMD;  Location: [place];  Service: Dentistry;  Laterality: N/A;   RIGHT/LEFT HEART CATH AND CORONARY ANGIOGRAPHY Bilateral 09/06/2019   Procedure: RIGHT/LEFT HEART CATH AND CORONARY ANGIOGRAPHY;  Surgeon: Minna Merritts, MD;  Location: [place] CV LAB;  Service: Cardiovascular;  Laterality: Bilateral;   TEE WITHOUT CARDIOVERSION N/A 09/13/2017   Procedure: TRANSESOPHAGEAL ECHOCARDIOGRAM (TEE);  Surgeon: Nelva Bush, MD;  Location: [facility];  Service: Cardiovascular;  Laterality: N/A;   TEE WITHOUT CARDIOVERSION N/A 04/01/2020   Procedure: TRANSESOPHAGEAL ECHOCARDIOGRAM (TEE);  Surgeon: [name], MD;  Location: [facility] ENDOSCOPY;  Service: Cardiovascular;  Laterality: N/A;   TEE WITHOUT CARDIOVERSION N/A 04/29/2020   Procedure: TRANSESOPHAGEAL ECHOCARDIOGRAM (TEE);  Surgeon: Rexene Alberts, MD;  Location: [place];  Service: Open Heart Surgery;  Laterality: N/A;   TOTAL KNEE ARTHROPLASTY Left 12/25/2014   Procedure: TOTAL KNEE ARTHROPLASTY;  Surgeon: Thornton Park, MD;  Location: [facility];  Service: Orthopedics;  Laterality: Left;   TUBAL LIGATION      Allergies  Allergies  Allergen Reactions   Morphine Hives and Rash   Oxycodone Hcl Hives and Itching   Dilaudid [Hydromorphone Hcl] Itching    History of Present  Illness    55 y.o female with past medical history of HFpEF, HTN, hyperlipidemia, interstitial lung disease, mitral valve stenosis s/p mech MVR, and paroxysmal atrial fibrillation.  Patient hospitalized 09/11/2017 for chest tightness and was found to be in new onset atrial fibrillation with RVR. She underwent a TEE which showed moderate mitral stenosis and was cardioverted. During an office visit in 12/2017 noted tachypalpitaions with HR in the 130s, EKG showing NSR.  Coronary CTA was performed 02/2018 d/t chest tightness and showed nonobstructive CAD.  She had progressive mitral stenosis on serial echo's w/ severe MS on echo in 06/2019.  Patient was referred to Dr. 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 She was found to have periodontal disease that required multiple teeth extracted to prep for surgery on 02/28/20.  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  Dr. Roxy Manns performed a mitral valve replacement with Onyx bileaflet mechanical valve with MAZE procedure on 04/29/2020. She was started on coumadin d/t mechanical MVR. Follow-up echo 06-12-20 showed EF 50-55%; no MV regurgitation. She had left upper extremity ulner nerve surgery with Dr. Apolonio Schneiders on 8/15.  She required lovenox bridging perioperatively.  Unfortunately, post-op, she experienced significant bleeding and required readmission w/ 1 unit of PRBCs for HBG of 7.7.  Most recent H/H 9.1/27.9 on 8/22.  She has noted some oozing from the site since.  Today the patient states she is doing "so so" on follow-up. She endorses palpations and fluttering in her chest at night time when she lays down that last for a couple hours. She states that it is the same feeling as when she is in atrial fibrillation. She uses a home HR monitor and says during these episodes her HR ranges between 100-110 bpm. She also has exertional dyspnea when walking long distances that is relieved by rest. The patient states that the bleeding has  improved. Currently still taking Lovenox to bridge INR; her last INR was 1.6.  She denies chest pain, pnd, orthopnea, n, v, dizziness, syncope, edema, weight gain, or early satiety.   Home Medications    Current Outpatient Medications  Medication Sig Dispense Refill   ALPRAZolam (XANAX) 0.5 MG tablet Take 0.5 tablets (0.25 mg total) by mouth at bedtime as needed for anxiety. 30 tablet 1   amLODipine (NORVASC) 10 MG tablet Take 1 tablet (10 mg total) by mouth daily. 90 tablet 1   atorvastatin (LIPITOR) 80 MG tablet TAKE 1 TABLET BY MOUTH EVERY DAY AT 6 PM 90 tablet 1   calcium carbonate (TUMS - DOSED IN MG ELEMENTAL CALCIUM) 500 MG chewable tablet Chew 500 mg by mouth daily as needed for indigestion or heartburn.     enoxaparin (LOVENOX) 100 MG/ML injection Inject 1 mL (100 mg total) into the skin every 12 (twelve) hours. 7 mL 0   ferrous [id number] C-folic acid (TRINSICON / FOLTRIN) capsule Take 1 capsule by mouth 2 (two) times daily after a meal. 60 capsule 2   fluticasone (FLONASE) 50 MCG/ACT nasal spray Place 1 spray into both nostrils daily as needed for rhinitis.     metoprolol succinate (TOPROL-XL) 100 MG 24 hr tablet Take 1 tablet (100 mg total) by mouth 2 (two) times daily. Take with or immediately following a meal. 180 tablet 3   omeprazole (PRILOSEC) 40 MG capsule Take 1 capsule (40 mg total) by mouth daily. 90 capsule 3   oxyCODONE-acetaminophen (PERCOCET) 10-325 MG tablet Take 1 tablet by mouth every 6 (six) hours as needed.     potassium chloride SA (KLOR-CON) 20 MEQ tablet Take 1 tablet (20 mEq total) by mouth 2 (two) times daily. 60 tablet 5   spironolactone (ALDACTONE) 25 MG tablet Take 0.5 tablets (12.5 mg total) by mouth daily. 90 tablet 1   torsemide (DEMADEX) 20 MG tablet TAKE 2 TABLETS(40 MG) BY MOUTH TWICE DAILY 180 tablet 1   warfarin (COUMADIN) 2 MG tablet TAKE 1 TABLET BY MOUTH DAILY AT 4PM AS INSTRUCTED BY COUMADIN CLINIC FOR LIFETIME (Patient taking  differently: Friday 1.5 qd 08/20, 1/2 tablet til Wednesday INR check) 90 tablet 1   albuterol (VENTOLIN HFA) 108 (90 Base) MCG/ACT inhaler Inhale 2 puffs into the lungs every 6 (six) hours as needed for wheezing or shortness of breath. (Patient not taking: Reported on 09/19/2020) 8 g 2   HYDROcodone-acetaminophen (NORCO) 10-325 MG tablet Take 1 tablet by mouth every 6 (six) hours as needed. (Patient not taking: Reported on 09/19/2020)     No current facility-administered medications for this visit.     Review of Systems    Cardiac: +++ Palpations, fluttering Respiratory: +++ chronic SOB on exertion All other systems  reviewed and are otherwise negative except as noted above.  Physical Exam    VS:  BP 140/76 (BP Location: Right Arm, Patient Position: Sitting, Cuff Size: Normal)   Pulse 78   Ht '5\' 4"'  (1.626 m)   Wt 234 lb (106.1 kg)   SpO2 94%   BMI 40.17 kg/m  , BMI Body mass index is 40.17 kg/m.     GEN: Obese, in no acute distress. HEENT: normal. Neck: Supple, obese- difficult to gauge JVP.  No carotid bruits, or masses. Cardiac: RRR, mech S1. No clubbing, cyanosis, edema.  PT 2+ and equal bilaterally.  Respiratory:  Respirations regular and unlabored, clear to auscultation bilaterally. GI: Soft, nontender, nondistended, BS + x 4. MS: no deformity or atrophy. Skin: warm and dry, no rash. Well healing sternal incision.  Neuro:  Strength and sensation are intact. Psych: Normal affect.  Accessory Clinical Findings    ECG personally reviewed by me today Normal Sinus Rhythm, 78, no acute ST/T changes  Lab Results  Component Value Date   WBC 10.9 (H) 09/08/2020   HGB 9.1 (L) 09/08/2020   HCT 27.9 (L) 09/08/2020   MCV 89.4 09/08/2020   PLT 297 09/08/2020   Lab Results  Component Value Date   CREATININE 1.34 (H) 09/08/2020   BUN 19 09/08/2020   NA 136 09/08/2020   K 3.3 (L) 09/08/2020   CL 97 (L) 09/08/2020   CO2 29 09/08/2020   Lab Results  Component Value Date   ALT  32 09/06/2020   AST 25 09/06/2020   ALKPHOS 116 09/06/2020   BILITOT 0.5 09/06/2020   Lab Results  Component Value Date   CHOL 204 (H) 03/07/2020   HDL 35 (L) 03/07/2020   LDLCALC 129 (H) 03/07/2020   TRIG 224 (H) 03/07/2020   CHOLHDL 5.2 09/19/2018    Lab Results  Component Value Date   HGBA1C 5.8 (A) 06/06/2020    Assessment & Plan    1.  Mitral Valve Stenosis: s/p mitral valve replacement with Onyx bileaflet mechanical valve with MAZE procedure on 04/29/2020 with Dr. Roxy Manns. Nl fxn'ing valve on echo in 05/2020.  Last INR of 1.6, continue with Lovenox bridge and weekly follow-up INR through our coag clinic.  2. Paroxysmal atrial fibrillation: s/p MAZE.  On chronic coumadin.  Patient states feeling palpations and fluttering in her chest at night time. Rates into low 100's for a few hours @ times.  Will increase metoprolol to 137m BID and obtain a 2 week ZIO patch.  May need to consider AAD depending upon findings and Afib burden.  3.  Chronic Diastolic congestive heart failure: Patient appears euvolemic.  HR stable today, though BP elevated.  Titrating ? blocker in setting of HTN and palpitations.  Continue torsemide and spironolactone. Patient instructed to obtain daily weights.   4. Hyperlipidemia: Continue Atorvastatin.  LDL prev 129 in 02/2020 - ? Compliance.  5. Hyperension: In office b/p of 140/76. Increasing metoprolol to 1034mBID. Instructed patient to obtain home b/p cuff to trend pressures.   6. Disposition: Follow-up in 6-8 weeks to follow ZIO patch results. [facility].  ChMurray HodgkinsNP 09/19/2020, 5:50 PM

## 2020-09-19 NOTE — Patient Instructions (Addendum)
Medication Instructions:  Your physician has recommended you make the following change in your medication:   INCREASE Metoprolol succinate (Toprol XL) to 100 mg twice a day  *If you need a refill on your cardiac medications before your next appointment, please call your pharmacy*   Lab Work: None  If you have labs (blood work) drawn today and your tests are completely normal, you will receive your results only by: [place] (if you have MyChart) OR A paper copy in the mail If you have any lab test that is abnormal or we need to change your treatment, we will call you to review the results.   Testing/Procedures: Your physician has recommended that you wear a Zio monitor for 2 weeks. Remove monitor on October 03, 2020 and return in box provided.   This monitor is a medical device that records the heart's electrical activity. Doctors most often use these monitors to diagnose arrhythmias. Arrhythmias are problems with the speed or rhythm of the heartbeat. The monitor is a small device applied to your chest. You can wear one while you do your normal daily activities. While wearing this monitor if you have any symptoms to push the button and record what you felt. Once you have worn this monitor for the period of time provider prescribed (Usually 14 days), you will return the monitor device in the postage paid box. Once it is returned they will download the data collected and provide [place] with a report which the provider will then review and we will call you with those results. Important tips:  Avoid showering during the first 24 hours of wearing the monitor. Avoid excessive sweating to help maximize wear time. Do not submerge the device, no hot tubs, and no swimming pools. Keep any lotions or oils away from the patch. After 24 hours you may shower with the patch on. Take brief showers with your back facing the shower head.  Do not remove patch once it has been placed because that will  interrupt data and decrease adhesive wear time. Push the button when you have any symptoms and write down what you were feeling. Once you have completed wearing your monitor, remove and place into box which has postage paid and place in your outgoing mailbox.  If for some reason you have misplaced your box then call our office and we can provide another box and/or mail it off for you.      Follow-Up: At [facility], you and your health needs are our priority.  As part of our continuing mission to provide you with exceptional heart care, we have created designated Provider Care Teams.  These Care Teams include your primary Cardiologist (physician) and Advanced Practice Providers (APPs -  Physician Assistants and Nurse Practitioners) who all work together to provide you with the care you need, when you need it.   Your next appointment:   6-8 week(s)  The format for your next appointment:   In Person  Provider:   Ida Rogue, MD or Murray Hodgkins, NP

## 2020-09-24 ENCOUNTER — Ambulatory Visit (INDEPENDENT_AMBULATORY_CARE_PROVIDER_SITE_OTHER): Payer: 59

## 2020-09-24 ENCOUNTER — Other Ambulatory Visit: Payer: Self-pay

## 2020-09-24 DIAGNOSIS — I050 Rheumatic mitral stenosis: Secondary | ICD-10-CM

## 2020-09-24 DIAGNOSIS — I4891 Unspecified atrial fibrillation: Secondary | ICD-10-CM | POA: Diagnosis not present

## 2020-09-24 DIAGNOSIS — Z5181 Encounter for therapeutic drug level monitoring: Secondary | ICD-10-CM

## 2020-09-24 LAB — POCT INR: INR: 2.2 (ref 2.0–3.0)

## 2020-09-24 NOTE — Patient Instructions (Addendum)
Since we don't have a date for surgery as of this visit, please  - STOP LOVENOX - take 1.5 tablets warfarin tonight, then, - continue warfarin 1 tablet daily except for 1/2 tablet on SUNDAYS & THURSDAYS - Recheck INR in 1 week  CALL [phone] if you are scheduled for surgery and haven't heard from our office so we can resume your Lovenox injections

## 2020-09-25 ENCOUNTER — Telehealth: Payer: Self-pay | Admitting: Cardiovascular Disease

## 2020-09-25 NOTE — Telephone Encounter (Signed)
   [facility] Pre-operative Risk Assessment    Patient Name: Leslie [name]  DOB: October 25, 1965 MRN: [id number]  HEARTCARE STAFF:  - IMPORTANT!!!!!! Under Visit Info/Reason for Call, type in Other and utilize the format Clearance MM/DD/YY or Clearance TBD. Do not use dashes or single digits. - Please review there is not already an duplicate clearance open for this procedure. - If request is for dental extraction, please clarify the # of teeth to be extracted. - If the patient is currently at the dentist's office, call Pre-Op Callback Staff (MA/nurse) to input urgent request.  - If the patient is not currently in the dentist office, please route to the Pre-Op pool.  Request for surgical clearance:  What type of surgery is being performed? Left elbow hematoma evacuation  When is this surgery scheduled? 10/01/2020  What type of clearance is required (medical clearance vs. Pharmacy clearance to hold med vs. Both)? Not listed  Are there any medications that need to be held prior to surgery and how long? Not listed Practice name and name of physician performing surgery? [facility] Dr. Iran Planas  What is the office phone number? [phone] 7.   What is the office fax number? [phone]  8.   Anesthesia type (None, local, MAC, general) ? not listed  Eli Phillips 09/25/2020, 9:18 AM  _________________________________________________________________   (provider comments below)

## 2020-09-25 NOTE — Telephone Encounter (Signed)
Hi Leslie [name] saw this patient on 09/19/20. She needs hematoma evacuation. It appears she did not have cardiac complications after her last surgery. I see that you ordered a zio patch, Do you recommend that she postpone surgery until after heart monitor is complete?

## 2020-09-25 NOTE — Telephone Encounter (Signed)
I don't think there will be a need to postpone her surgery. I placed a monitor to assess for burden of afib, as she reported palpitations and has a prior h/o PAF.  Previously normal cors, and as you mentioned, she did fine w/ recent surgery in August.  The bigger issue will be holding her warfarin again in the setting of mech MVR, for which we'll need pharmacy input.  She's been on lovenox for several weeks due to subRx INRs and was told to stop it yesterday as INR was 2.2 and trending up.

## 2020-09-25 NOTE — Telephone Encounter (Signed)
   Name: Leslie [name]  DOB: Aug 14, 1965  MRN: [id number]   Primary Cardiologist: Ida Rogue, MD  Chart reviewed as part of pre-operative protocol coverage. Lorae L Lahmann was last seen on 09/19/20 by Ignacia Bayley NP. Heart monitor placed for Afib burden, should not hold up surgery. She did not have cardiac complications following her recent surgery. Heart cath in 2021 with normal coronaries.   She has been on lovenox for subtherapeutic INRs, now at 2.2. Given her mechanical mitral valve I will also route to pharmacy, although we were not specifically asked for a hold.   Therefore, based on ACC/AHA guidelines, the patient would be at acceptable risk for the planned procedure without further cardiovascular testing.   The patient was advised that if she develops new symptoms prior to surgery to contact our office to arrange for a follow-up visit, and she verbalized understanding.  I will route this recommendation to the requesting party via Epic fax function and remove from pre-op pool. Please call with questions.  Ledora Bottcher, PA 09/25/2020, 3:49 PM

## 2020-09-26 ENCOUNTER — Ambulatory Visit: Payer: 59 | Admitting: Physician Assistant

## 2020-09-26 ENCOUNTER — Other Ambulatory Visit: Payer: Self-pay

## 2020-09-26 ENCOUNTER — Encounter: Payer: Self-pay | Admitting: Physician Assistant

## 2020-09-26 DIAGNOSIS — I10 Essential (primary) hypertension: Secondary | ICD-10-CM

## 2020-09-26 DIAGNOSIS — N1831 Chronic kidney disease, stage 3a: Secondary | ICD-10-CM

## 2020-09-26 DIAGNOSIS — T148XXA Other injury of unspecified body region, initial encounter: Secondary | ICD-10-CM

## 2020-09-26 DIAGNOSIS — Z954 Presence of other heart-valve replacement: Secondary | ICD-10-CM

## 2020-09-26 MED ORDER — ENOXAPARIN SODIUM 100 MG/ML IJ SOSY: 100.0000 mg | PREFILLED_SYRINGE | Freq: Two times a day (BID) | INTRAMUSCULAR | 0 refills | Status: DC

## 2020-09-26 NOTE — Progress Notes (Signed)
[facility] [place], [place] [address]  Internal MEDICINE  Office Visit Note  Patient Name: Leslie [name]  [id number]  [id number]  Date of Service: 09/26/2020  Chief Complaint  Patient presents with   Follow-up    U/s    Hyperlipidemia   Hypertension   Anxiety    HPI Pt is here for follow up to review renal US -Pt had surgery on left ulnar nerve on 8/15 that resulted in post op bleeding seen in office 3 days later. In office dressing change on 8/19 showed bleeding and sent to ED. In ED topical tranexamic acid applied to stop bleed and wrapped and splinted again. Was given 1 unit PRBC due to low Hemoglobin of 7.7. Hemoglobin increased to 9.1 and was discharged on 8/22. She has left elbow hematoma surgery scheduled in a week.  -Cardiology raised her metoprolol to 147m, doesn't check BP at home. -Torsemide 244m2 in AM and once in PM. This was a decrease due to worsening creatinine functions. A renal [place] ordered to evaluate and showed no obstruction but stable simple cyst in right kidney measuring 2.9cm. Will repeat blood work in 3 months and if continued worsening may refer to nephrology to establish care.  Current Medication: Outpatient Encounter Medications as of 09/26/2020  Medication Sig   ALPRAZolam (XANAX) 0.5 MG tablet Take 0.5 tablets (0.25 mg total) by mouth at bedtime as needed for anxiety.   amLODipine (NORVASC) 10 MG tablet Take 1 tablet (10 mg total) by mouth daily.   atorvastatin (LIPITOR) 80 MG tablet TAKE 1 TABLET BY MOUTH EVERY DAY AT 6 PM   calcium carbonate (TUMS - DOSED IN MG ELEMENTAL CALCIUM) 500 MG chewable tablet Chew 500 mg by mouth daily as needed for indigestion or heartburn.   ferrous [id number]-folic acid (TRINSICON / FOLTRIN) capsule Take 1 capsule by mouth 2 (two) times daily after a meal.   metoprolol succinate (TOPROL-XL) 100 MG 24 hr tablet Take 1 tablet (100 mg total) by mouth 2 (two) times daily. Take with or  immediately following a meal.   omeprazole (PRILOSEC) 40 MG capsule Take 1 capsule (40 mg total) by mouth daily.   potassium chloride SA (KLOR-CON) 20 MEQ tablet Take 1 tablet (20 mEq total) by mouth 2 (two) times daily.   spironolactone (ALDACTONE) 25 MG tablet Take 0.5 tablets (12.5 mg total) by mouth daily.   torsemide (DEMADEX) 20 MG tablet TAKE 2 TABLETS(40 MG) BY MOUTH TWICE DAILY   warfarin (COUMADIN) 2 MG tablet TAKE 1 TABLET BY MOUTH DAILY AT 4PM AS INSTRUCTED BY COUMADIN CLINIC FOR LIFETIME (Patient taking differently: Take 1-3 mg by mouth See admin instructions. Take 3 mg on Wed. Take 1 mg daily on Thursday and Sun. Take 2 mg daily on Fri, Sat, Mon, and Tues. Check INR on Wed 9/14)   enoxaparin (LOVENOX) 100 MG/ML injection Inject 1 mL (100 mg total) into the skin every 12 (twelve) hours. (Patient not taking: No sig reported)   No facility-administered encounter medications on file as of 09/26/2020.    Surgical History: Past Surgical History:  Procedure Laterality Date   ABDOMINAL HYSTERECTOMY     total   ABDOMINAL HYSTERECTOMY     BREAST BIOPSY Bilateral 2012   BREAST BIOPSY Right 08-07-12   fibroadenomatous changes and columnar cells   BREAST BIOPSY  02/03/2015   stereo byrnett   BUBBLE STUDY  04/01/2020   Procedure: BUBBLE STUDY;  Surgeon: AcElouise MunroeMD;  Location:  [facility];  Service: Cardiovascular;;   CARDIAC CATHETERIZATION     CHOLECYSTECTOMY N/A 02/27/2016   Procedure: LAPAROSCOPIC CHOLECYSTECTOMY;  Surgeon: Christene Lye, MD;  Location: [facility] ORS;  Service: General;  Laterality: N/A;   CLIPPING OF ATRIAL APPENDAGE  04/29/2020   Procedure: CLIPPING OF ATRIAL APPENDAGE USING ATRICURE  PRO2 CLIP SIZE 45MM;  Surgeon: Rexene Alberts, MD;  Location: [facility] OR;  Service: Open Heart Surgery;;   COLONOSCOPY WITH PROPOFOL N/A 09/26/2018   Procedure: COLONOSCOPY WITH PROPOFOL;  Surgeon: Lucilla Lame, MD;  Location: [facility] ENDOSCOPY;  Service: Endoscopy;  Laterality: N/A;    CYSTOSCOPY W/ RETROGRADES Right 08/18/2018   Procedure: CYSTOSCOPY WITH RETROGRADE PYELOGRAM;  Surgeon: Billey Co, MD;  Location: [facility] ORS;  Service: Urology;  Laterality: Right;   CYSTOSCOPY/URETEROSCOPY/HOLMIUM LASER/STENT PLACEMENT Right 08/18/2018   Procedure: CYSTOSCOPY/URETEROSCOPY/STENT PLACEMENT;  Surgeon: Billey Co, MD;  Location: [facility] ORS;  Service: Urology;  Laterality: Right;   DIAGNOSTIC LAPAROSCOPY     ESOPHAGOGASTRODUODENOSCOPY (EGD) WITH PROPOFOL N/A 09/26/2018   Procedure: ESOPHAGOGASTRODUODENOSCOPY (EGD) WITH PROPOFOL;  Surgeon: Lucilla Lame, MD;  Location: [facility] ENDOSCOPY;  Service: Endoscopy;  Laterality: N/A;   GIVENS CAPSULE STUDY N/A 11/03/2018   Procedure: GIVENS CAPSULE STUDY;  Surgeon: Lucilla Lame, MD;  Location: [facility] ENDOSCOPY;  Service: Endoscopy;  Laterality: N/A;   JOINT REPLACEMENT Left    knee   KNEE ARTHROPLASTY Right 04/09/2019   Procedure: COMPUTER ASSISTED TOTAL KNEE ARTHROPLASTY;  Surgeon: Dereck Leep, MD;  Location: [facility] ORS;  Service: Orthopedics;  Laterality: Right;   KNEE CLOSED REDUCTION Left 04/15/2015   Procedure: CLOSED MANIPULATION KNEE;  Surgeon: Thornton Park, MD;  Location: [facility] ORS;  Service: Orthopedics;  Laterality: Left;   KNEE SURGERY     MAZE N/A 04/29/2020   Procedure: MAZE;  Surgeon: Rexene Alberts, MD;  Location: [place];  Service: Open Heart Surgery;  Laterality: N/A;   MITRAL VALVE REPLACEMENT N/A 04/29/2020   Procedure: MITRAL VALVE (MV) REPLACEMENT USING ON-X VALVE SIZE 27/29MM;  Surgeon: Rexene Alberts, MD;  Location: [place];  Service: Open Heart Surgery;  Laterality: N/A;   MULTIPLE EXTRACTIONS WITH ALVEOLOPLASTY N/A 02/28/2020   Procedure: MULTIPLE EXTRACTION WITH ALVEOLOPLASTY;  Surgeon: [name], DMD;  Location: [place];  Service: Dentistry;  Laterality: N/A;   RIGHT/LEFT HEART CATH AND CORONARY ANGIOGRAPHY Bilateral 09/06/2019   Procedure: RIGHT/LEFT HEART CATH AND CORONARY ANGIOGRAPHY;  Surgeon: Minna Merritts, MD;  Location: [place] [facility];  Service: Cardiovascular;  Laterality: Bilateral;   TEE WITHOUT CARDIOVERSION N/A 09/13/2017   Procedure: TRANSESOPHAGEAL ECHOCARDIOGRAM (TEE);  Surgeon: Nelva Bush, MD;  Location: [facility];  Service: Cardiovascular;  Laterality: N/A;   TEE WITHOUT CARDIOVERSION N/A 04/01/2020   Procedure: TRANSESOPHAGEAL ECHOCARDIOGRAM (TEE);  Surgeon: [name], MD;  Location: [facility] ENDOSCOPY;  Service: Cardiovascular;  Laterality: N/A;   TEE WITHOUT CARDIOVERSION N/A 04/29/2020   Procedure: TRANSESOPHAGEAL ECHOCARDIOGRAM (TEE);  Surgeon: Rexene Alberts, MD;  Location: [place];  Service: Open Heart Surgery;  Laterality: N/A;   TOTAL KNEE ARTHROPLASTY Left 12/25/2014   Procedure: TOTAL KNEE ARTHROPLASTY;  Surgeon: Thornton Park, MD;  Location: [facility] ORS;  Service: Orthopedics;  Laterality: Left;   TUBAL LIGATION      Medical History: Past Medical History:  Diagnosis Date   (HFpEF) heart failure with preserved ejection fraction ([place])    a. 08/2017 Echo: EF 55-60%.  Grade 2 diastolic dysfunction; b. 05/6254 Echo: EF 50-55%, no rwma, Nl RV fxn, nl fxn'ing mech  MV.   Allergy    Anemia    Anxiety    BRCA negative 03/22/2013   Bronchitis 02/19/2016   ON LEVAQUIN PO   Chest tightness    a. 08/2019 Cath: nl cors.   Cigarette smoker 09/11/2017   8-10 day   Dyspnea    Dysrhythmia    GERD (gastroesophageal reflux disease)    History of kidney stones    Hyperlipidemia    Hypertension    Interstitial lung disease ([place])    a. CT 2013 b. 02/2018 CXR noted recurrent intersistial changes ILD vs chronic bronchitis   Moderate mitral stenosis    a.  08/2017 TEE: EF 60 to 65%.  Moderate mitral stenosis.  Mean gradient 14 mmHg.  Valve area 2.59 cm by planimetry, 2.72 cm by pressure half-time.   PAF (paroxysmal atrial fibrillation) ([place])    a. 08/2017 s/p TEE/DCCV; b. CHA2DS2VASc = 2-->warfarin.   Pneumonia    S/P Maze operation for atrial fibrillation 04/29/2020    Complete bilateral atrial lesion set using bipolar radiofrequency and cryothermy ablation with clipping of LA appendage   S/P mitral valve replacement with Onyx bileaflet mechanical valve 04/29/2020   a. 04/2020 s/p 27/29 mm Onyx mech mitral valve-->chronic coumadin; b. 05/2020 Echo: Nl fxn'ing mech MV.    Family History: Family History  Problem Relation Age of Onset   Cancer Mother 10       breast   Hypertension Mother    Cancer Maternal Aunt        breast   Cancer Maternal Grandmother        breast   Osteoarthritis Father    Hypertension Father     Social History   Socioeconomic History   Marital status: Married    Spouse name: Not on file   Number of children: Not on file   Years of education: Not on file   Highest education level: Not on file  Occupational History   Not on file  Tobacco Use   Smoking status: Former    Packs/day: 0.50    Years: 20.00    Pack years: 10.00    Types: Cigarettes    Quit date: 01/2017    Years since quitting: 3.6   Smokeless tobacco: Never  Vaping Use   Vaping Use: Never used  Substance and Sexual Activity   Alcohol use: Yes    Comment: rarely   Drug use: No   Sexual activity: Not on file  Other Topics Concern   Not on file  Social History Narrative   Not on file   Social Determinants of Health   Financial Resource Strain: Low Risk    Difficulty of Paying Living Expenses: Not very hard  Food Insecurity: No Food Insecurity   Worried About Running Out of Food in the Last Year: Never true   [place] in the Last Year: Never true  Transportation Needs: No Transportation Needs   Lack of Transportation (Medical): No   Lack of Transportation (Non-Medical): No  Physical Activity: Not on file  Stress: Not on file  Social Connections: Not on file  Intimate Partner Violence: Not on file      Review of Systems  Constitutional:  Negative for chills, fatigue and unexpected weight change.  HENT:  Negative for congestion,  postnasal drip, rhinorrhea, sneezing and sore throat.   Eyes:  Negative for redness.  Respiratory:  Negative for cough, chest tightness and shortness of breath.   Cardiovascular:  Negative for chest pain  and palpitations.  Gastrointestinal:  Negative for abdominal pain, constipation, diarrhea, nausea and vomiting.  Genitourinary:  Negative for dysuria and frequency.  Musculoskeletal:  Negative for arthralgias, back pain, joint swelling and neck pain.       Left elbow pain and some tingling in hand still  Skin:  Negative for rash.  Neurological: Negative.  Negative for tremors and numbness.  Hematological:  Negative for adenopathy. Does not bruise/bleed easily.  Psychiatric/Behavioral:  Negative for behavioral problems (Depression), sleep disturbance and suicidal ideas. The patient is not nervous/anxious.    Vital Signs: BP 140/71   Pulse 77   Temp 97.8 F (36.6 C)   Resp 16   Ht '5\' 4"'  (1.626 m)   Wt 231 lb (104.8 kg)   SpO2 99%   BMI 39.65 kg/m    Physical Exam Vitals and nursing note reviewed.  Constitutional:      General: She is not in acute distress.    Appearance: She is well-developed. She is obese. She is not diaphoretic.  HENT:     Head: Normocephalic and atraumatic.     Mouth/Throat:     Pharynx: No oropharyngeal exudate.  Eyes:     Pupils: Pupils are equal, round, and reactive to light.  Neck:     Thyroid: No thyromegaly.     Vascular: No JVD.     Trachea: No tracheal deviation.  Cardiovascular:     Rate and Rhythm: Normal rate and regular rhythm.     Heart sounds: Normal heart sounds. No murmur heard.   No friction rub. No gallop.  Pulmonary:     Effort: Pulmonary effort is normal. No respiratory distress.     Breath sounds: No wheezing or rales.  Chest:     Chest wall: No tenderness.  Abdominal:     General: Bowel sounds are normal.     Palpations: Abdomen is soft.  Musculoskeletal:        General: Normal range of motion.     Cervical back: Normal  range of motion and neck supple.     Comments: Left UE splint in place, in sling  Lymphadenopathy:     Cervical: No cervical adenopathy.  Skin:    General: Skin is warm and dry.  Neurological:     Mental Status: She is alert and oriented to person, place, and time.     Cranial Nerves: No cranial nerve deficit.  Psychiatric:        Behavior: Behavior normal.        Thought Content: Thought content normal.        Judgment: Judgment normal.       Assessment/Plan: 1. Stage 3a chronic kidney disease ([place]) Reviewed renal US showing stable right simple cyst. Discussed repeating CMP in 3 months to monitor kidney function, however on further chart review pt already has scheduled appt to see nephrology to follow renal function and will follow their recommendations. - Comprehensive metabolic panel  2. Primary hypertension Stable, continue current medications  3. S/P mitral valve replacement with Onyx bileaflet mechanical valve Followed by cardiology  4. Bleeding from wound Stable since ED visit stopped bleeding, elbow currently bandaged and splinted and will have have follow up with ortho to remove hematoma    General Counseling: Nara verbalizes understanding of the findings of todays visit and agrees with plan of treatment. I have discussed any further diagnostic evaluation that may be needed or ordered today. We also reviewed her medications today. she has been encouraged to call  the office with any questions or concerns that should arise related to todays visit.    Orders Placed This Encounter  Procedures   Comprehensive metabolic panel     No orders of the defined types were placed in this encounter.   This patient was seen by Drema Dallas, PA-C in collaboration with Dr. Clayborn Bigness as a part of collaborative care agreement.   Total time spent:30 Minutes Time spent includes review of chart, medications, test results, and follow up plan with the patient.      Dr  Lavera Guise Internal medicine

## 2020-09-26 NOTE — Telephone Encounter (Signed)
Was able to reach out to Mrs. Bloor after speaking with Doroteo Bradford, Therapist, sports with [employer] team in [place].  Mrs. Shiveley schedule for sx 9/14 with ortho (Dr. Caralyn Guile reached out to Dr. Rockey Situ regarding sx for hematoma on 9/14). Pt has recently been on bridge a few weeks ago d/t sx with same provider.  Doroteo Bradford, RN was able to send instructions to this RN to review with pt  Pt verbalized understanding, uploaded to pt's MyChart for review, reports she will review soon as she gets notification, She is aware that Lovenox script was sent into pharmacy so she has refills.   Mrs. Lazare very thankful for the call and will call back with any concerns or further instructions.     09/26/20: Start holding Warfarin per Dr Rockey Situ.   09/27/20: No warfarin or enoxaparin (Lovenox).   09/28/20: Inject enoxaparin'100mg'$  in the fatty abdominal tissue at least 2 inches from the belly button twice a day about 12 hours apart, 8am and 8pm rotate sites. No warfarin.   09/29/20: Inject enoxaparin in the fatty tissue every 12 hours, 8am and 8pm. No warfarin.   09/30/20: Inject enoxaparin in the fatty tissue in the morning at 8 am (No PM dose). No warfarin.   10/01/20: Procedure Day - No enoxaparin - Resume warfarin in the evening or as directed by doctor (take an extra half tablet with usual dose for 2 days then resume normal dose).   10/02/20: Resume enoxaparin inject in the fatty tissue every 12 hours and take warfarin   10/03/20: Inject enoxaparin in the fatty tissue every 12 hours and take warfarin   10/04/20: Inject enoxaparin in the fatty tissue every 12 hours and take warfarin   10/05/20: Inject enoxaparin in the fatty tissue every 12 hours and take warfarin   10/06/20: Inject enoxaparin in the fatty tissue every 12 hours and take warfarin   10/07/20: Inject enoxaparin in the fatty tissue every 12 hours and take warfarin   10/08/20: warfarin appt to check INR at 1:45pm in [place] office.  We cancelled appt on 10/01/20  given surgery.

## 2020-09-26 NOTE — Telephone Encounter (Signed)
Verbally spoken to Dr. Rockey Situ to relay the message that Dr. Caralyn Guile is trying to reach him. Advised phone note in basket.

## 2020-09-26 NOTE — Telephone Encounter (Signed)
Dr. Caralyn Guile calling to speak directly with Dr. Rockey Situ.  Declined to provide additional information or specific concerns .    Per request call [phone]

## 2020-09-26 NOTE — Telephone Encounter (Signed)
Please confirm with MD if they are requiring her to hold her warfarin. She will need a bridge if she has to come off of warfarin.

## 2020-09-26 NOTE — Telephone Encounter (Signed)
Estill Bamberg, RN from [facility] called states pt needs Lovenox bridge urgently.  Pt scheduled for emergent evacuation of elbow hematoma on 10/01/20.  Per Dr Donivan Scull discussion with ortho MD pt needs to start holding Warfarin today and needs Lovenox bridging prior to procedure.   09/26/20: Start holding Warfarin per Dr Rockey Situ.  09/27/20: No warfarin or enoxaparin (Lovenox).  09/28/20: Inject enoxaparin'100mg'$  in the fatty abdominal tissue at least 2 inches from the belly button twice a day about 12 hours apart, 8am and 8pm rotate sites. No warfarin.  09/29/20: Inject enoxaparin in the fatty tissue every 12 hours, 8am and 8pm. No warfarin.  09/30/20: Inject enoxaparin in the fatty tissue in the morning at 8 am (No PM dose). No warfarin.  10/01/20: Procedure Day - No enoxaparin - Resume warfarin in the evening or as directed by doctor (take an extra half tablet with usual dose for 2 days then resume normal dose).  10/02/20: Resume enoxaparin inject in the fatty tissue every 12 hours and take warfarin  10/03/20: Inject enoxaparin in the fatty tissue every 12 hours and take warfarin  10/04/20: Inject enoxaparin in the fatty tissue every 12 hours and take warfarin  10/05/20: Inject enoxaparin in the fatty tissue every 12 hours and take warfarin  10/06/20: Inject enoxaparin in the fatty tissue every 12 hours and take warfarin  10/07/20: Inject enoxaparin in the fatty tissue every 12 hours and take warfarin  10/08/20: warfarin appt to check INR at 1:45pm in [place] office.  We cancelled appt on 10/01/20 given surgery.

## 2020-09-29 ENCOUNTER — Other Ambulatory Visit: Payer: Self-pay

## 2020-09-29 ENCOUNTER — Encounter (HOSPITAL_COMMUNITY): Payer: Self-pay | Admitting: Orthopedic Surgery

## 2020-09-29 NOTE — Progress Notes (Signed)
Spoke with pt for pre-op call. Pt had Mitral Valve replacement, MAZE and TEE in April of this year. Pt states she has done well from that surgery. Pt had ulnar nerve injury surgery in August, started bleeding into her elbow. Had to have a blood transfusion due to Hgb being 7.7. Pt is on Coumadin due to A-fib. Dr. Rockey Situ instructed pt to stop it on 09/26/20 (last dose) and to start Lovenox on 09/28/20 (which she has). She was given detailed instructions via nurse at Dr. Donivan Scull office.   Pt's surgery is scheduled as ambulatory so no Covid test is required prior to surgery.

## 2020-10-01 ENCOUNTER — Encounter (HOSPITAL_COMMUNITY): Payer: Self-pay | Admitting: Anesthesiology

## 2020-10-01 ENCOUNTER — Ambulatory Visit (HOSPITAL_COMMUNITY): Admission: RE | Admit: 2020-10-01 | Payer: 59 | Source: Home / Self Care | Admitting: Orthopedic Surgery

## 2020-10-01 SURGERY — EVACUATION HEMATOMA
Anesthesia: Choice | Laterality: Left

## 2020-10-01 NOTE — Anesthesia Preprocedure Evaluation (Deleted)
Anesthesia Evaluation    Reviewed: Allergy & Precautions, Patient's Chart, lab work & pertinent test results  Airway        Dental   Pulmonary former smoker,  Interstitial lung disease Quit smoking 2018, 10 pack year history          Cardiovascular hypertension, +CHF (grade 2 diastolic dysfunction)  + dysrhythmias (s/p MAZE 04/2020, previously on coumadin but transitioned to lovenox when she had bleeding into elbow in august 2022) Atrial Fibrillation + Valvular Problems/Murmurs (s/p MVR 04/2020)   Echo 05/2020: 1. Left ventricular ejection fraction, by estimation, is 50 to 55%. The  left ventricle has low normal function. The left ventricle has no regional  wall motion abnormalities. Left ventricular diastolic parameters are  indeterminate.  2. Right ventricular systolic function is low normal. The right  ventricular size is normal.  3. The mitral valve has been repaired/replaced. No evidence of mitral  valve regurgitation. The mean mitral valve gradient is 6.0 mmHg. There is a 27/29 Onyx mechanical valve present in the mitral position. Procedure  4. The aortic valve was not well visualized. Aortic valve regurgitation  is not visualized.  5. The inferior vena cava is normal in size with <50% respiratory  variability, suggesting right atrial pressure of 8 mmHg.   Cath 08/2019 (pre-MVR MAZE): Final Conclusions:   No significant coronary artery disease Moderately elevated right heart pressures consistent with moderate pulmonary hypertension Recent echocardiogram with severe mitral valve stenosis  Recommendations:  We will recommend referral to CT surgery for consideration of mitral valve stenosis evaluation, valve replacement Scheduled to have a Zio monitor given continued paroxysmal tachycardia despite high doses of metoprolol She is on high doses of torsemide with worsening renal dysfunction and still with markedly elevated right  heart pressures consistent with worsening mitral valve stenosis   Neuro/Psych PSYCHIATRIC DISORDERS Anxiety Depression negative neurological ROS     GI/Hepatic Neg liver ROS, GERD  ,  Endo/Other  negative endocrine ROS  Renal/GU Renal disease  negative genitourinary   Musculoskeletal  (+) Arthritis , Osteoarthritis,    Abdominal   Peds  Hematology negative hematology ROS (+)   Anesthesia Other Findings had ulnar nerve injury surgery in August, started bleeding into her elbow. Had to have a blood transfusion due to Hgb being 7.7.  Reproductive/Obstetrics negative OB ROS                             Anesthesia Physical Anesthesia Plan  ASA: 3  Anesthesia Plan: General   Post-op Pain Management:    Induction: Intravenous  PONV Risk Score and Plan: 3 and Ondansetron, Dexamethasone, Midazolam and Treatment may vary due to age or medical condition  Airway Management Planned: Oral ETT  Additional Equipment: None  Intra-op Plan:   Post-operative Plan: Extubation in OR  Informed Consent:   Plan Discussed with:   Anesthesia Plan Comments:         Anesthesia Quick Evaluation

## 2020-10-06 ENCOUNTER — Telehealth: Payer: Self-pay | Admitting: Cardiovascular Disease

## 2020-10-06 NOTE — Telephone Encounter (Signed)
Called pt in reference to updated instructions: Leslie Birch, RN  Supple, Harlon Flor, RPH-CPP 42 minutes ago (3:50 PM)   Jinny Blossom,  Dr. Rockey Situ spoke with Dr. Apolonio Schneiders  This was 2nd sx for pt d/t post-opt bleed.  They agreed on holding Lovenox until Thursday night 9/22 (3 days after surgery)  So she can restart her Lovenox and Thursday night and so fourth.  Dr. Rockey Situ reports, if pt starts to bleed from the surgical site or bleeding from the Lovenox that can't be controlled she is to call Dr. Apolonio Schneiders for further advise.   Hope this helps with getting her restarted  Thanks  Mandie   Advised pt that she will continue holding Lovenox until Thursday night at 8pm. After discussing with Jinny Blossom, PharmD pt will resume Warfarin tonight and she will take 1.5 tablets tonight (9/19) and 1.5 tablets tomorrow (9/20) then resume normal dose of 1 tablet daily except 1/2 tablet on Sunday and Thursday. She was given appt to have INR rechecked on Friday in [place] location.

## 2020-10-06 NOTE — Telephone Encounter (Signed)
Patient calling for status.

## 2020-10-06 NOTE — Telephone Encounter (Signed)
Dr. Caralyn Guile calling to discuss post op warfarin with provider.  Per RN Ilene Qua send to anti coag for management .   Please call Dr. Caralyn Guile on his cell [phone]

## 2020-10-06 NOTE — Telephone Encounter (Signed)
Pt called and stated that she had surgery today and was wondering how she is suppose to take her warfarin. Instructed pt to call Dr. Donivan Scull office at [phone], because per phone note Dr. Apolonio Schneiders needs to speak to Dr. Rockey Situ specifically.

## 2020-10-06 NOTE — Telephone Encounter (Signed)
Called Dr Caralyn Guile, he wishes to speak to Dr Rockey Situ specifically. States they discussed pt last week as well. Pt recently bridged with Lovenox for procedure given hx of mechanical MVR and afib. Will forward to MD.

## 2020-10-09 LAB — COMPREHENSIVE METABOLIC PANEL
ALT: 15 IU/L (ref 0–32)
AST: 21 IU/L (ref 0–40)
Albumin/Globulin Ratio: 1.3 (ref 1.2–2.2)
Albumin: 4.7 g/dL (ref 3.8–4.9)
Alkaline Phosphatase: 180 IU/L — ABNORMAL HIGH (ref 44–121)
BUN/Creatinine Ratio: 13 (ref 9–23)
BUN: 19 mg/dL (ref 6–24)
Bilirubin Total: 0.2 mg/dL (ref 0.0–1.2)
CO2: 29 mmol/L (ref 20–29)
Calcium: 10.1 mg/dL (ref 8.7–10.2)
Chloride: 95 mmol/L — ABNORMAL LOW (ref 96–106)
Creatinine, Ser: 1.45 mg/dL — ABNORMAL HIGH (ref 0.57–1.00)
Globulin, Total: 3.5 g/dL (ref 1.5–4.5)
Glucose: 107 mg/dL — ABNORMAL HIGH (ref 65–99)
Potassium: 4.2 mmol/L (ref 3.5–5.2)
Sodium: 137 mmol/L (ref 134–144)
Total Protein: 8.2 g/dL (ref 6.0–8.5)
eGFR: 43 mL/min/{1.73_m2} — ABNORMAL LOW (ref >59)

## 2020-10-10 ENCOUNTER — Other Ambulatory Visit: Payer: Self-pay

## 2020-10-10 ENCOUNTER — Ambulatory Visit (INDEPENDENT_AMBULATORY_CARE_PROVIDER_SITE_OTHER): Payer: 59

## 2020-10-10 DIAGNOSIS — Z5181 Encounter for therapeutic drug level monitoring: Secondary | ICD-10-CM | POA: Diagnosis not present

## 2020-10-10 DIAGNOSIS — I050 Rheumatic mitral stenosis: Secondary | ICD-10-CM | POA: Diagnosis not present

## 2020-10-10 DIAGNOSIS — I4891 Unspecified atrial fibrillation: Secondary | ICD-10-CM

## 2020-10-10 LAB — POCT INR: INR: 1.5 — AB (ref 2.0–3.0)

## 2020-10-10 NOTE — Patient Instructions (Signed)
-   resume Lovenox tonight, then  - take 1.5 tablets warfarin tonight & tomorrow, then, - resume warfarin 1 tablet daily except for 1/2 tablet on SUNDAYS & THURSDAYS - Recheck INR next Wednesday

## 2020-10-15 ENCOUNTER — Ambulatory Visit (INDEPENDENT_AMBULATORY_CARE_PROVIDER_SITE_OTHER): Payer: 59

## 2020-10-15 ENCOUNTER — Other Ambulatory Visit: Payer: Self-pay

## 2020-10-15 ENCOUNTER — Other Ambulatory Visit: Payer: Self-pay | Admitting: Internal Medicine

## 2020-10-15 DIAGNOSIS — I4891 Unspecified atrial fibrillation: Secondary | ICD-10-CM

## 2020-10-15 DIAGNOSIS — Z5181 Encounter for therapeutic drug level monitoring: Secondary | ICD-10-CM

## 2020-10-15 DIAGNOSIS — I050 Rheumatic mitral stenosis: Secondary | ICD-10-CM | POA: Diagnosis not present

## 2020-10-15 LAB — POCT INR: INR: 2.1 (ref 2.0–3.0)

## 2020-10-15 NOTE — Patient Instructions (Signed)
-   STOP LOVENOX - take 1.5 tablets warfarin tonight & tomorrow, then, - resume warfarin 1 tablet daily except for 1/2 tablet on SUNDAYS & THURSDAYS - Recheck INR next Wednesday

## 2020-10-22 ENCOUNTER — Ambulatory Visit (INDEPENDENT_AMBULATORY_CARE_PROVIDER_SITE_OTHER): Payer: 59

## 2020-10-22 ENCOUNTER — Inpatient Hospital Stay: Payer: 59 | Attending: Internal Medicine

## 2020-10-22 ENCOUNTER — Inpatient Hospital Stay: Payer: 59

## 2020-10-22 ENCOUNTER — Telehealth: Payer: Self-pay

## 2020-10-22 ENCOUNTER — Inpatient Hospital Stay (HOSPITAL_BASED_OUTPATIENT_CLINIC_OR_DEPARTMENT_OTHER): Payer: 59 | Admitting: Internal Medicine

## 2020-10-22 ENCOUNTER — Other Ambulatory Visit: Payer: Self-pay

## 2020-10-22 DIAGNOSIS — I130 Hypertensive heart and chronic kidney disease with heart failure and stage 1 through stage 4 chronic kidney disease, or unspecified chronic kidney disease: Secondary | ICD-10-CM | POA: Diagnosis not present

## 2020-10-22 DIAGNOSIS — D631 Anemia in chronic kidney disease: Secondary | ICD-10-CM | POA: Diagnosis present

## 2020-10-22 DIAGNOSIS — I4891 Unspecified atrial fibrillation: Secondary | ICD-10-CM

## 2020-10-22 DIAGNOSIS — I050 Rheumatic mitral stenosis: Secondary | ICD-10-CM | POA: Diagnosis not present

## 2020-10-22 DIAGNOSIS — Z5181 Encounter for therapeutic drug level monitoring: Secondary | ICD-10-CM | POA: Diagnosis not present

## 2020-10-22 DIAGNOSIS — N183 Chronic kidney disease, stage 3 unspecified: Secondary | ICD-10-CM | POA: Diagnosis present

## 2020-10-22 DIAGNOSIS — D649 Anemia, unspecified: Secondary | ICD-10-CM

## 2020-10-22 DIAGNOSIS — Z87891 Personal history of nicotine dependence: Secondary | ICD-10-CM | POA: Diagnosis not present

## 2020-10-22 DIAGNOSIS — R5383 Other fatigue: Secondary | ICD-10-CM | POA: Diagnosis not present

## 2020-10-22 DIAGNOSIS — Z952 Presence of prosthetic heart valve: Secondary | ICD-10-CM | POA: Insufficient documentation

## 2020-10-22 LAB — BASIC METABOLIC PANEL
Anion gap: 8 (ref 5–15)
BUN: 20 mg/dL (ref 6–20)
CO2: 30 mmol/L (ref 22–32)
Calcium: 8.8 mg/dL — ABNORMAL LOW (ref 8.9–10.3)
Chloride: 97 mmol/L — ABNORMAL LOW (ref 98–111)
Creatinine, Ser: 1.42 mg/dL — ABNORMAL HIGH (ref 0.44–1.00)
GFR, Estimated: 44 mL/min — ABNORMAL LOW (ref >60)
Glucose, Bld: 104 mg/dL — ABNORMAL HIGH (ref 70–99)
Potassium: 3.8 mmol/L (ref 3.5–5.1)
Sodium: 135 mmol/L (ref 135–145)

## 2020-10-22 LAB — POCT INR: INR: 3.9 — AB (ref 2.0–3.0)

## 2020-10-22 LAB — CBC WITH DIFFERENTIAL/PLATELET
Abs Immature Granulocytes: 0.04 10*3/uL (ref 0.00–0.07)
Basophils Absolute: 0.1 10*3/uL (ref 0.0–0.1)
Basophils Relative: 1 %
Eosinophils Absolute: 0.1 10*3/uL (ref 0.0–0.5)
Eosinophils Relative: 2 %
HCT: 33.7 % — ABNORMAL LOW (ref 36.0–46.0)
Hemoglobin: 11 g/dL — ABNORMAL LOW (ref 12.0–15.0)
Immature Granulocytes: 1 %
Lymphocytes Relative: 28 %
Lymphs Abs: 1.8 10*3/uL (ref 0.7–4.0)
MCH: 29.3 pg (ref 26.0–34.0)
MCHC: 32.6 g/dL (ref 30.0–36.0)
MCV: 89.9 fL (ref 80.0–100.0)
Monocytes Absolute: 0.7 10*3/uL (ref 0.1–1.0)
Monocytes Relative: 10 %
Neutro Abs: 3.8 10*3/uL (ref 1.7–7.7)
Neutrophils Relative %: 58 %
Platelets: 369 10*3/uL (ref 150–400)
RBC: 3.75 MIL/uL — ABNORMAL LOW (ref 3.87–5.11)
RDW: 13.6 % (ref 11.5–15.5)
WBC: 6.4 10*3/uL (ref 4.0–10.5)
nRBC: 0 % (ref 0.0–0.2)

## 2020-10-22 LAB — IRON AND TIBC
Iron: 69 ug/dL (ref 28–170)
Saturation Ratios: 19 % (ref 10.4–31.8)
TIBC: 371 ug/dL (ref 250–450)
UIBC: 302 ug/dL

## 2020-10-22 LAB — RETICULOCYTES
Immature Retic Fract: 11.7 % (ref 2.3–15.9)
RBC.: 3.81 MIL/uL — ABNORMAL LOW (ref 3.87–5.11)
Retic Count, Absolute: 83.4 10*3/uL (ref 19.0–186.0)
Retic Ct Pct: 2.2 % (ref 0.4–3.1)

## 2020-10-22 LAB — FERRITIN: Ferritin: 168 ng/mL (ref 11–307)

## 2020-10-22 NOTE — Telephone Encounter (Signed)
Janan Ridge, [place]  10/22/2020  9:56 AM EDT Back to Top    Attempted to contact patient with Zio results.  No answer. LMOV.  Phone note started.    Theora Gianotti, NP  10/20/2020 12:20 PM EDT     No afib.  Predominantly sinus rhythm with 7 brief episodes of fast heart rates from the top chambers.  Rare isolated extra heart beats from the top or bottom chambers.  Overall, reassuring.

## 2020-10-22 NOTE — Telephone Encounter (Signed)
Patient returning call.

## 2020-10-22 NOTE — Patient Instructions (Signed)
-   take 1/2 tablet warfarin tonight, then  - resume warfarin 1 tablet daily except for 1/2 tablet on SUNDAYS & THURSDAYS - Recheck INR in 2 weeks

## 2020-10-22 NOTE — Patient Instructions (Signed)
# #   recommend PO iron 65 mg [OTC]- once a day; if constipation/ or if abdominal bloating-recommend taking every other day.  Stool could turn black.Marland Kitchen

## 2020-10-22 NOTE — Assessment & Plan Note (Addendum)
#   Anemia- Hb 11.8- Iron sat- 20%;/iron deficiency/CKD; forgot to start-for now recommend p.o. iron once a day or every other day.  If intolerance would recommend IV iron infusion-reluctant.  # Etiology: likely sec to CKD-III/.   # DISPOSITION: # HOLD venofer # follow up in 6 months- MD; labs- cbc/bmp; iron studies ferritin- Possible Venofer- Dr.B

## 2020-10-22 NOTE — Telephone Encounter (Signed)
Returned patient call   Leslie [name], [facility]  10/22/2020 11:29 AM EDT Back to Top    The patient has been notified of the result and verbalized understanding.  All questions (if any) were answered. Leslie [name], [place] 10/22/2020 11:29 AM

## 2020-10-22 NOTE — Progress Notes (Signed)
[facility] NOTE  Patient Care Team: Lavera Guise, MD as PCP - General (Internal Medicine) Rockey Situ Kathlene November, MD as PCP - Cardiology (Cardiology) Christene Lye, MD (General Surgery)  CHIEF COMPLAINTS/PURPOSE OF CONSULTATION: ANEMIA   HEMATOLOGY HISTORY:  # ANEMIA MILD Hb ~11/CKD stage III; SEP 2020: EGD/ colonoscopy/capsule-NEG [Dr.Anna]; SEP 2022-PICC line had hematoma/profuse bleeding.  Needing admission to the hospital and also PRBC transfusion 1 unit.  Otherwise no further bleeds.  #CKD stage III; ?  Rheumatic heart disease s/p mitral valve replacement  HISTORY OF PRESENTING ILLNESS:  Leslie [name] 55 y.o.  female anemia - ? CKD is here for follow-up.  Patient was recommended iron oral at last visit; she forgot to take her medication.  No significant fatigue.  Of note patient had a PICC line had hematoma/profuse bleeding.  Needing admission to the hospital and also PRBC transfusion 1 unit.  Otherwise no further bleeds.  Review of Systems  Constitutional:  Positive for malaise/fatigue. Negative for chills, diaphoresis, fever and weight loss.  HENT:  Negative for nosebleeds and sore throat.   Eyes:  Negative for double vision.  Respiratory:  Negative for cough, hemoptysis, sputum production, shortness of breath and wheezing.   Cardiovascular:  Negative for chest pain, palpitations, orthopnea and leg swelling.  Gastrointestinal:  Negative for abdominal pain, blood in stool, constipation, diarrhea, heartburn, melena, nausea and vomiting.  Genitourinary:  Negative for dysuria, frequency and urgency.  Musculoskeletal:  Positive for joint pain. Negative for back pain.  Skin: Negative.  Negative for itching and rash.  Neurological:  Negative for dizziness, tingling, focal weakness, weakness and headaches.  Endo/Heme/Allergies:  Does not bruise/bleed easily.  Psychiatric/Behavioral:  Negative for depression. The patient is not nervous/anxious and does  not have insomnia.    MEDICAL HISTORY:  Past Medical History:  Diagnosis Date   (HFpEF) heart failure with preserved ejection fraction ([place])    a. 08/2017 Echo: EF 55-60%.  Grade 2 diastolic dysfunction; b. 05/9933 Echo: EF 50-55%, no rwma, Nl RV fxn, nl fxn'ing mech MV.   Allergy    Anemia    Anxiety    BRCA negative 03/22/2013   Bronchitis 02/19/2016   ON LEVAQUIN PO   Chest tightness    a. 08/2019 Cath: nl cors.   Cigarette smoker 09/11/2017   8-10 day   Dysrhythmia    GERD (gastroesophageal reflux disease)    History of kidney stones    Hyperlipidemia    Hypertension    Interstitial lung disease ([place])    a. CT 2013 b. 02/2018 CXR noted recurrent intersistial changes ILD vs chronic bronchitis   Moderate mitral stenosis    a.  08/2017 TEE: EF 60 to 65%.  Moderate mitral stenosis.  Mean gradient 14 mmHg.  Valve area 2.59 cm by planimetry, 2.72 cm by pressure half-time.   PAF (paroxysmal atrial fibrillation) ([place])    a. 08/2017 s/p TEE/DCCV; b. CHA2DS2VASc = 2-->warfarin.   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 04/2020 s/p 27/29 mm Onyx mech mitral valve-->chronic coumadin; b. 05/2020 Echo: Nl fxn'ing mech MV.    SURGICAL HISTORY: Past Surgical History:  Procedure Laterality Date   ABDOMINAL HYSTERECTOMY     total   ABDOMINAL HYSTERECTOMY     BREAST BIOPSY Bilateral  2012   BREAST BIOPSY Right 08-07-12   fibroadenomatous changes and columnar cells   BREAST BIOPSY  02/03/2015   stereo byrnett   BUBBLE STUDY  04/01/2020   Procedure: BUBBLE STUDY;  Surgeon: [name], MD;  Location: [place];  Service: Cardiovascular;;   CARDIAC CATHETERIZATION     CHOLECYSTECTOMY N/A 02/27/2016   Procedure: LAPAROSCOPIC CHOLECYSTECTOMY;  Surgeon:  Christene Lye, MD;  Location: [facility] ORS;  Service: General;  Laterality: N/A;   CLIPPING OF ATRIAL APPENDAGE  04/29/2020   Procedure: CLIPPING OF ATRIAL APPENDAGE USING ATRICURE  PRO2 CLIP SIZE 45MM;  Surgeon: Rexene Alberts, MD;  [facility];  Service: Open Heart Surgery;;   COLONOSCOPY WITH PROPOFOL N/A 09/26/2018   Procedure: COLONOSCOPY WITH PROPOFOL;  Surgeon: Lucilla Lame, MD;  Location: [facility] ENDOSCOPY;  Service: Endoscopy;  Laterality: N/A;   CYSTOSCOPY W/ RETROGRADES Right 08/18/2018   Procedure: CYSTOSCOPY WITH RETROGRADE PYELOGRAM;  Surgeon: Billey Co, MD;  Location: [facility] ORS;  Service: Urology;  Laterality: Right;   CYSTOSCOPY/URETEROSCOPY/HOLMIUM LASER/STENT PLACEMENT Right 08/18/2018   Procedure: CYSTOSCOPY/URETEROSCOPY/STENT PLACEMENT;  Surgeon: Billey Co, MD;  Location: [facility] ORS;  Service: Urology;  Laterality: Right;   DIAGNOSTIC LAPAROSCOPY     ESOPHAGOGASTRODUODENOSCOPY (EGD) WITH PROPOFOL N/A 09/26/2018   Procedure: ESOPHAGOGASTRODUODENOSCOPY (EGD) WITH PROPOFOL;  Surgeon: Lucilla Lame, MD;  Location: [facility] ENDOSCOPY;  Service: Endoscopy;  Laterality: N/A;   GIVENS CAPSULE STUDY N/A 11/03/2018   Procedure: GIVENS CAPSULE STUDY;  Surgeon: Lucilla Lame, MD;  Location: [facility] ENDOSCOPY;  Service: Endoscopy;  Laterality: N/A;   JOINT REPLACEMENT Left    knee   KNEE ARTHROPLASTY Right 04/09/2019   Procedure: COMPUTER ASSISTED TOTAL KNEE ARTHROPLASTY;  Surgeon: Dereck Leep, MD;  Location: [facility] ORS;  Service: Orthopedics;  Laterality: Right;   KNEE CLOSED REDUCTION Left 04/15/2015   Procedure: CLOSED MANIPULATION KNEE;  Surgeon: Thornton Park, MD;  Location: [facility] ORS;  Service: Orthopedics;  Laterality: Left;   KNEE SURGERY     MAZE N/A 04/29/2020   Procedure: MAZE;  Surgeon: Rexene Alberts, MD;  Location: [place];  Service: Open Heart Surgery;  Laterality: N/A;   MITRAL VALVE REPLACEMENT N/A 04/29/2020   Procedure: MITRAL VALVE (MV) REPLACEMENT USING ON-X VALVE  SIZE 27/29MM;  Surgeon: Rexene Alberts, MD;  Location: [place];  Service: Open Heart Surgery;  Laterality: N/A;   MULTIPLE EXTRACTIONS WITH ALVEOLOPLASTY N/A 02/28/2020   Procedure: MULTIPLE EXTRACTION WITH ALVEOLOPLASTY;  Surgeon: [name], DMD;  Location: [place];  Service: Dentistry;  Laterality: N/A;   RIGHT/LEFT HEART CATH AND CORONARY ANGIOGRAPHY Bilateral 09/06/2019   Procedure: RIGHT/LEFT HEART CATH AND CORONARY ANGIOGRAPHY;  Surgeon: Minna Merritts, MD;  Location: [place] CV LAB;  Service: Cardiovascular;  Laterality: Bilateral;   TEE WITHOUT CARDIOVERSION N/A 09/13/2017   Procedure: TRANSESOPHAGEAL ECHOCARDIOGRAM (TEE);  Surgeon: Nelva Bush, MD;  Location: [facility];  Service: Cardiovascular;  Laterality: N/A;   TEE WITHOUT CARDIOVERSION N/A 04/01/2020   Procedure: TRANSESOPHAGEAL ECHOCARDIOGRAM (TEE);  Surgeon: [name], MD;  Location: [facility] ENDOSCOPY;  Service: Cardiovascular;  Laterality: N/A;   TEE WITHOUT CARDIOVERSION N/A 04/29/2020   Procedure: TRANSESOPHAGEAL ECHOCARDIOGRAM (TEE);  Surgeon: Rexene Alberts, MD;  Location: [place];  Service: Open Heart Surgery;  Laterality: N/A;   TOTAL KNEE ARTHROPLASTY Left 12/25/2014   Procedure: TOTAL KNEE ARTHROPLASTY;  Surgeon: Thornton Park, MD;  Location: [facility] ORS;  Service: Orthopedics;  Laterality: Left;   TUBAL LIGATION      SOCIAL  HISTORY: Social History   Socioeconomic History   Marital status: Married    Spouse name: Not on file   Number of children: Not on file   Years of education: Not on file   Highest education level: Not on file  Occupational History   Not on file  Tobacco Use   Smoking status: Former    Packs/day: 0.50    Years: 20.00    Pack years: 10.00    Types: Cigarettes    Quit date: 01/2016    Years since quitting: 4.7   Smokeless tobacco: Never  Vaping Use   Vaping Use: Never used  Substance and Sexual Activity   Alcohol use: Yes    Comment: rarely   Drug use: No   Sexual  activity: Not on file  Other Topics Concern   Not on file  Social History Narrative   Not on file   Social Determinants of Health   Financial Resource Strain: Low Risk    Difficulty of Paying Living Expenses: Not very hard  Food Insecurity: No Food Insecurity   Worried About Running Out of Food in the Last Year: Never true   Ran Out of Food in the Last Year: Never true  Transportation Needs: No Transportation Needs   Lack of Transportation (Medical): No   Lack of Transportation (Non-Medical): No  Physical Activity: Not on file  Stress: Not on file  Social Connections: Not on file  Intimate Partner Violence: Not on file    FAMILY HISTORY: Family History  Problem Relation Age of Onset   Cancer Mother 11       breast   Hypertension Mother    Cancer Maternal Aunt        breast   Cancer Maternal Grandmother        breast   Osteoarthritis Father    Hypertension Father     ALLERGIES:  is allergic to morphine, oxycodone hcl, and dilaudid [hydromorphone hcl].  MEDICATIONS:  Current Outpatient Medications  Medication Sig Dispense Refill   amLODipine (NORVASC) 10 MG tablet Take 1 tablet (10 mg total) by mouth daily. 90 tablet 1   atorvastatin (LIPITOR) 80 MG tablet TAKE 1 TABLET BY MOUTH EVERY DAY AT 6 PM 90 tablet 1   calcium carbonate (TUMS - DOSED IN MG ELEMENTAL CALCIUM) 500 MG chewable tablet Chew 500 mg by mouth daily as needed for indigestion or heartburn.     ferrous [id number] C-folic acid (TRINSICON / FOLTRIN) capsule Take 1 capsule by mouth 2 (two) times daily after a meal. 60 capsule 2   metoprolol succinate (TOPROL-XL) 100 MG 24 hr tablet Take 1 tablet (100 mg total) by mouth 2 (two) times daily. Take with or immediately following a meal. 180 tablet 3   omeprazole (PRILOSEC) 40 MG capsule Take 1 capsule (40 mg total) by mouth daily. 90 capsule 3   potassium chloride SA (KLOR-CON) 20 MEQ tablet Take 1 tablet (20 mEq total) by mouth 2 (two) times daily. 60  tablet 5   spironolactone (ALDACTONE) 25 MG tablet Take 0.5 tablets (12.5 mg total) by mouth daily. 90 tablet 1   torsemide (DEMADEX) 20 MG tablet TAKE 2 TABLETS(40 MG) BY MOUTH TWICE DAILY 180 tablet 1   warfarin (COUMADIN) 2 MG tablet TAKE 1 TABLET BY MOUTH DAILY AT 4PM AS INSTRUCTED BY COUMADIN CLINIC FOR LIFETIME (Patient taking differently: Take 1-3 mg by mouth See admin instructions. Take 3 mg on Wed. Take 1 mg daily on Thursday and Sun. Take 2  mg daily on Fri, Sat, Mon, and Tues. Check INR on Wed 9/14) 90 tablet 1   ALPRAZolam (XANAX) 0.5 MG tablet Take 0.5 tablets (0.25 mg total) by mouth at bedtime as needed for anxiety. (Patient not taking: Reported on 10/22/2020) 30 tablet 1   No current facility-administered medications for this visit.      PHYSICAL EXAMINATION:   Vitals:   10/22/20 1314  BP: (!) 142/56  Pulse: 64  Resp: 20  Temp: 98.2 F (36.8 C)   Filed Weights   10/22/20 1314  Weight: 234 lb (106.1 kg)    Physical Exam Vitals and nursing note reviewed.  Constitutional:      Comments: Ambulating: independently  with family  HENT:     Head: Normocephalic and atraumatic.     Mouth/Throat:     Pharynx: Oropharynx is clear.  Eyes:     Extraocular Movements: Extraocular movements intact.     Pupils: Pupils are equal, round, and reactive to light.  Cardiovascular:     Rate and Rhythm: Normal rate and regular rhythm.  Pulmonary:     Comments: Decreased breath sounds bilaterally.  Abdominal:     Palpations: Abdomen is soft.  Musculoskeletal:        General: Normal range of motion.     Cervical back: Normal range of motion.  Skin:    General: Skin is warm.  Neurological:     General: No focal deficit present.     Mental Status: She is alert and oriented to person, place, and time.  Psychiatric:        Behavior: Behavior normal.        Judgment: Judgment normal.    LABORATORY DATA:  I have reviewed the data as listed Lab Results  Component Value Date    WBC 6.4 10/22/2020   HGB 11.0 (L) 10/22/2020   HCT 33.7 (L) 10/22/2020   MCV 89.9 10/22/2020   PLT 369 10/22/2020   Recent Labs    12/31/19 1102 02/28/20 0848 03/07/20 1039 03/26/20 1129 07/23/20 1503 09/05/20 0004 09/06/20 0649 09/07/20 0441 09/08/20 0439 10/08/20 1337 10/22/20 1300  NA 141   < > 140   < > 137   < > 136 135 136 137 135  K 3.7   < > 4.0   < > 3.3*   < > 3.3* 3.1* 3.3* 4.2 3.8  CL 101   < > 98   < > 98   < > 97* 99 97* 95* 97*  CO2 26   < > 24   < > 31   < > '28 27 29 29 30  ' GLUCOSE 98   < > 117*   < > 105*   < > 123* 143* 135* 107* 104*  BUN 16   < > 14   < > 20   < > 19 21* '19 19 20  ' CREATININE 1.18*   < > 1.26*   < > 1.54*   < > 1.22* 1.25* 1.34* 1.45* 1.42*  CALCIUM 9.1   < > 9.2   < > 9.0   < > 8.8* 9.0 9.1 10.1 8.8*  GFRNONAA 53*   < > 49*   < > 40*   < > 53* 51* 47*  --  44*  GFRAA 61  --  56*  --   --   --   --   --   --   --   --   PROT  --   --  7.8   < > 8.6*  --  7.8  --   --  8.2  --   ALBUMIN  --   --  4.4   < > 4.3  --  3.8  --   --  4.7  --   AST  --   --  24   < > 24  --  25  --   --  21  --   ALT  --   --  23   < > 20  --  32  --   --  15  --   ALKPHOS  --   --  184*   < > 130*  --  116  --   --  180*  --   BILITOT  --   --  0.3   < > 0.6  --  0.5  --   --  0.2  --    < > = values in this interval not displayed.     LONG TERM MONITOR (3-14 DAYS)  Result Date: 10/18/2020 Event Monitor Patch Wear Time:  6 days and 17 hours (2022-09-02T16:16:54-0400 to 2022-09-09T09:40:50-0400) Patient had a min HR of 60 bpm, max HR of 167 bpm, and avg HR of 77 bpm. Predominant underlying rhythm was Sinus Rhythm. 7 Supraventricular Tachycardia runs occurred, the run with the fastest interval lasting 4 beats with a max rate of 167 bpm, the longest lasting 6 beats with an avg rate of 106 bpm. Isolated SVEs were rare (<1.0%), SVE Couplets were rare (<1.0%), and SVE Triplets were rare (<1.0%). Isolated VEs were rare (<1.0%, 178), VE Couplets were rare (<1.0%, 5),  and VE Triplets were rare (<1.0%, 1). Ventricular Bigeminy was present. No patient triggered events noted Signed, Esmond Plants, MD, Ph.D [facility]    Normocytic anemia # Anemia- Hb 11.8- Iron sat- 20%;/iron deficiency/CKD; forgot to start-for now recommend p.o. iron once a day or every other day.  If intolerance would recommend IV iron infusion-reluctant.  # Etiology: likely sec to CKD-III/.   # DISPOSITION: # HOLD venofer # follow up in 6 months- MD; labs- cbc/bmp; iron studies ferritin- Possible Venofer- Dr.B  All questions were answered. The patient knows to call the clinic with any problems, questions or concerns.   Cammie Sickle, MD 10/22/2020 4:05 PM

## 2020-10-24 LAB — HAPTOGLOBIN: Haptoglobin: 59 mg/dL (ref 33–346)

## 2020-10-29 ENCOUNTER — Encounter: Payer: Self-pay | Admitting: Internal Medicine

## 2020-11-01 ENCOUNTER — Other Ambulatory Visit: Payer: Self-pay | Admitting: Internal Medicine

## 2020-11-01 DIAGNOSIS — N1832 Chronic kidney disease, stage 3b: Secondary | ICD-10-CM

## 2020-11-01 DIAGNOSIS — Z954 Presence of other heart-valve replacement: Secondary | ICD-10-CM

## 2020-11-01 DIAGNOSIS — Z8679 Personal history of other diseases of the circulatory system: Secondary | ICD-10-CM

## 2020-11-01 DIAGNOSIS — R42 Dizziness and giddiness: Secondary | ICD-10-CM

## 2020-11-01 DIAGNOSIS — Z79899 Other long term (current) drug therapy: Secondary | ICD-10-CM

## 2020-11-01 DIAGNOSIS — I10 Essential (primary) hypertension: Secondary | ICD-10-CM

## 2020-11-01 DIAGNOSIS — R000 Tachycardia, unspecified: Secondary | ICD-10-CM

## 2020-11-01 DIAGNOSIS — R413 Other amnesia: Secondary | ICD-10-CM

## 2020-11-05 ENCOUNTER — Other Ambulatory Visit: Payer: Self-pay

## 2020-11-05 ENCOUNTER — Ambulatory Visit (INDEPENDENT_AMBULATORY_CARE_PROVIDER_SITE_OTHER): Payer: 59

## 2020-11-05 DIAGNOSIS — Z5181 Encounter for therapeutic drug level monitoring: Secondary | ICD-10-CM

## 2020-11-05 DIAGNOSIS — I050 Rheumatic mitral stenosis: Secondary | ICD-10-CM

## 2020-11-05 DIAGNOSIS — I4891 Unspecified atrial fibrillation: Secondary | ICD-10-CM

## 2020-11-05 LAB — POCT INR: INR: 3.5 — AB (ref 2.0–3.0)

## 2020-11-05 NOTE — Patient Instructions (Signed)
-   try to have some greens in the next day or 2 - continue warfarin 1 tablet daily except for 1/2 tablet on SUNDAYS & THURSDAYS - Recheck INR in 3 weeks

## 2020-11-07 ENCOUNTER — Other Ambulatory Visit: Payer: Self-pay

## 2020-11-07 ENCOUNTER — Encounter: Payer: Self-pay | Admitting: Cardiovascular Disease

## 2020-11-07 ENCOUNTER — Ambulatory Visit (INDEPENDENT_AMBULATORY_CARE_PROVIDER_SITE_OTHER): Payer: 59 | Admitting: Cardiovascular Disease

## 2020-11-07 ENCOUNTER — Encounter: Payer: Self-pay | Admitting: Internal Medicine

## 2020-11-07 VITALS — BP 130/60 | HR 64 | Ht 64.0 in | Wt 239.4 lb

## 2020-11-07 DIAGNOSIS — I050 Rheumatic mitral stenosis: Secondary | ICD-10-CM | POA: Diagnosis not present

## 2020-11-07 DIAGNOSIS — I4891 Unspecified atrial fibrillation: Secondary | ICD-10-CM

## 2020-11-07 DIAGNOSIS — E782 Mixed hyperlipidemia: Secondary | ICD-10-CM

## 2020-11-07 DIAGNOSIS — I480 Paroxysmal atrial fibrillation: Secondary | ICD-10-CM | POA: Diagnosis not present

## 2020-11-07 DIAGNOSIS — I10 Essential (primary) hypertension: Secondary | ICD-10-CM | POA: Diagnosis not present

## 2020-11-07 DIAGNOSIS — I5032 Chronic diastolic (congestive) heart failure: Secondary | ICD-10-CM

## 2020-11-07 MED ORDER — AMOXICILLIN 500 MG PO TABS: 2000.0000 mg | ORAL_TABLET | Freq: Two times a day (BID) | ORAL | 1 refills | Status: DC

## 2020-11-07 MED ORDER — CEPHALEXIN 500 MG PO CAPS: 500.0000 mg | ORAL_CAPSULE | Freq: Four times a day (QID) | ORAL | 0 refills | Status: DC

## 2020-11-07 NOTE — Patient Instructions (Addendum)
Medication Instructions:  Keflex 500 mg  four times a day for 5 days  Before dental procedure, take amoxicillin 4 pills two hours before dental procedure  If you need a refill on your cardiac medications before your next appointment, please call your pharmacy.   Lab work: No new labs needed  Testing/Procedures: No new testing needed  Follow-Up: At [facility], you and your health needs are our priority.  As part of our continuing mission to provide you with exceptional heart care, we have created designated Provider Care Teams.  These Care Teams include your primary Cardiologist (physician) and Advanced Practice Providers (APPs -  Physician Assistants and Nurse Practitioners) who all work together to provide you with the care you need, when you need it.  You will need a follow up appointment in 6 months  Providers on your designated Care Team:   Murray Hodgkins, NP Christell Faith, PA-C Marrianne Mood, PA-C [place], [place]  COVID-19 Vaccine Information can be found at: [email or web address] For questions related to vaccine distribution or appointments, please email [email or web address] or call [phone].

## 2020-11-07 NOTE — Progress Notes (Addendum)
Date:  11/07/2020   ID:  Leslie [name], DOB 1965-07-31, MRN [id number]   PCP:  Lavera Guise, MD  Cardiologist:  Ida Rogue, MD  Electrophysiologist:  None   Evaluation Performed:  Follow-Up Visit   Chief Complaint  Patient presents with   6 week follow up    Patient c/o shortness of breath with exertion and LE edema. Medications reviewed by the patient verbally.     History of Present Illness:    Leslie [name] is a 55 y.o. female with  mitral valve stenosis morbid obesity,  Snores, possible OSA anxiety,  hyperlipidemia,  Hospital admission 09/11/2017 for  shortness of breath chest tightness in atrial fibrillation with RVR Had TEE  , Cardioversion Mild COPD, long hx of smoking Cardiac CTA Coronary calcium score of 10. Who presents for follow up of her paroxysmal atrial fibrillation, mitral valve stenosis, s/p mechanical MVR, PAF s/p maze,  Last seen in clinic by myself December 2021 Seen by one of our providers May 2022  Reports weight is up several pounds, slight increase in shortness of breath Review of weight over the past 2 months indicates weight 231 up to 239 Reports she has been compliant with torsemide 40 daily Was previously on torsemide 40 twice daily  Little bit of ankle swelling Fell in shower , cut foot, "huge gash", fifth toe left foot Did not seek medical attention, feels very sore   left upper extremity ulnar nerve surgery done on 8/15 by Dr. Gavin Pound complicated by postop bleeding  Dressing change in office on 8/19 showed bleeding so she was sent to ED for ongoing surgical site bleeding Hemoglobin dropped to 7.7 S/p 1 unit PRBC  heparin bridge while in the hospital  Underwent repeat surgery for hematoma in September 2022, off warfarin, had Lovenox bridge Reports still has some numbness in her hand, has not started PT yet  EKG personally reviewed by myself on todays visit Shows normal sinus rhythm rate 64 bpm no significant ST-T  wave changes  Other past medical history reviewed transesophageal echocardiogram 04/01/2020-confirming rheumatic heart disease with severe mitral stenosis.    04/29/2020 she underwent median sternotomy, mitral valve replacement, MAZE.  Coumadin was initiated due to mechanical MVR  Echo 05/2020 Close to normal cardiac function Prosthetic mitral valve working well  Echocardiogram August 2020  mitral valve Moderate thickening of the mitral valve leaflet. Moderate-severe mitral valve stenosis, with mean gradient of 16 mmHg and MVA by continuity equation of 1.0 cm^2.    Transesophageal echo August 2019 with moderate mitral valve stenosis mean gradient of 14 Mobility of the posterior leaflet was restricted.    summer 2020 developed right flank pain,   underwent right ureteroscopy, laser lithotripsy, and stent placement on 08/18/2018.  Had pyelonephritis post procedure  Creatinine trended up  to 4.1  acute kidney injury and sepsis in the setting of recent right ureteral stone status post lithotripsy and likely pyelonephritis.   atrial fibrillation December 2019 01/05/2018 awoke suddenly with tachypalpitations  rates in the 130s   EMS was called, noted to be in sinus rhythm with heart rates in the 80s to 90s bpm.     Cardiac CTA 1. Coronary calcium score of 10. This was 25 percentile for age and sex matched control.. Normal coronary origin with right dominance.Mild non-obstructive CAD.  Past Medical History:  Diagnosis Date   (HFpEF) heart failure with preserved ejection fraction ([place])    a. 08/2017 Echo: EF 55-60%.  Grade 2 diastolic dysfunction; b. 0/3500 Echo: EF 50-55%, no rwma, Nl RV fxn, nl fxn'ing mech MV.   Allergy    Anemia    Anxiety    BRCA negative 03/22/2013   Bronchitis 02/19/2016   ON LEVAQUIN PO   Chest tightness    a. 08/2019 Cath: nl cors.   Cigarette smoker 09/11/2017   8-10 day   Dysrhythmia    GERD (gastroesophageal reflux disease)    History of kidney stones     Hyperlipidemia    Hypertension    Interstitial lung disease ([place])    a. CT 2013 b. 02/2018 CXR noted recurrent intersistial changes ILD vs chronic bronchitis   Moderate mitral stenosis    a.  08/2017 TEE: EF 60 to 65%.  Moderate mitral stenosis.  Mean gradient 14 mmHg.  Valve area 2.59 cm by planimetry, 2.72 cm by pressure half-time.   PAF (paroxysmal atrial fibrillation) ([place])    a. 08/2017 s/p TEE/DCCV; b. CHA2DS2VASc = 2-->warfarin.   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 04/2020 s/p 27/29 mm Onyx mech mitral valve-->chronic coumadin; b. 05/2020 Echo: Nl fxn'ing mech MV.   Past Surgical History:  Procedure Laterality Date   ABDOMINAL HYSTERECTOMY     total   ABDOMINAL HYSTERECTOMY     BREAST BIOPSY Bilateral 2012   BREAST BIOPSY Right 08-07-12   fibroadenomatous changes and columnar cells   BREAST BIOPSY  02/03/2015   stereo byrnett   BUBBLE STUDY  04/01/2020   Procedure: BUBBLE STUDY;  Surgeon: [name], MD;  Location: [facility];  Service: Cardiovascular;;   CARDIAC CATHETERIZATION     CHOLECYSTECTOMY N/A 02/27/2016   Procedure: LAPAROSCOPIC CHOLECYSTECTOMY;  Surgeon: Christene Lye, MD;  Location: [facility];  Service: General;  Laterality: N/A;   CLIPPING OF ATRIAL APPENDAGE  04/29/2020   Procedure: CLIPPING OF ATRIAL APPENDAGE USING ATRICURE  PRO2 CLIP SIZE 45MM;  Surgeon: Rexene Alberts, MD;  Location[facility];  Service: Open Heart Surgery;;   COLONOSCOPY WITH PROPOFOL N/A 09/26/2018   Procedure: COLONOSCOPY WITH PROPOFOL;  Surgeon: Lucilla Lame, MD;  Location: [facility] ENDOSCOPY;  Service: Endoscopy;  Laterality: N/A;   CYSTOSCOPY W/ RETROGRADES Right 08/18/2018   Procedure: CYSTOSCOPY WITH RETROGRADE PYELOGRAM;  Surgeon: Billey Co,  MD;  Location: [facility];  Service: Urology;  Laterality: Right;   CYSTOSCOPY/URETEROSCOPY/HOLMIUM LASER/STENT PLACEMENT Right 08/18/2018   Procedure: CYSTOSCOPY/URETEROSCOPY/STENT PLACEMENT;  Surgeon: Billey Co, MD;  Location: [facility];  Service: Urology;  Laterality: Right;   DIAGNOSTIC LAPAROSCOPY     ESOPHAGOGASTRODUODENOSCOPY (EGD) WITH PROPOFOL N/A 09/26/2018   Procedure: ESOPHAGOGASTRODUODENOSCOPY (EGD) WITH PROPOFOL;  Surgeon: Lucilla Lame, MD;  Location: [facility] ENDOSCOPY;  Service: Endoscopy;  Laterality: N/A;   GIVENS CAPSULE STUDY N/A 11/03/2018   Procedure: GIVENS CAPSULE STUDY;  Surgeon: Lucilla Lame, MD;  Location: [facility];  Service: Endoscopy;  Laterality: N/A;   JOINT REPLACEMENT Left    knee   KNEE ARTHROPLASTY Right 04/09/2019   Procedure: COMPUTER ASSISTED TOTAL KNEE ARTHROPLASTY;  Surgeon: Dereck Leep, MD;  Location: [facility];  Service: Orthopedics;  Laterality: Right;   KNEE CLOSED REDUCTION Left 04/15/2015   Procedure: CLOSED MANIPULATION KNEE;  Surgeon: Thornton Park, MD;  Location: [facility];  Service: Orthopedics;  Laterality: Left;   KNEE SURGERY  MAZE N/A 04/29/2020   Procedure: MAZE;  Surgeon: Rexene Alberts, MD;  Location: [place];  Service: Open Heart Surgery;  Laterality: N/A;   MITRAL VALVE REPLACEMENT N/A 04/29/2020   Procedure: MITRAL VALVE (MV) REPLACEMENT USING ON-X VALVE SIZE 27/29MM;  Surgeon: Rexene Alberts, MD;  Location: [place];  Service: Open Heart Surgery;  Laterality: N/A;   MULTIPLE EXTRACTIONS WITH ALVEOLOPLASTY N/A 02/28/2020   Procedure: MULTIPLE EXTRACTION WITH ALVEOLOPLASTY;  Surgeon: [name], DMD;  Location: [place];  Service: Dentistry;  Laterality: N/A;   RIGHT/LEFT HEART CATH AND CORONARY ANGIOGRAPHY Bilateral 09/06/2019   Procedure: RIGHT/LEFT HEART CATH AND CORONARY ANGIOGRAPHY;  Surgeon: Minna Merritts, MD;  Location: [place] CV LAB;  Service: Cardiovascular;  Laterality: Bilateral;   TEE WITHOUT CARDIOVERSION  N/A 09/13/2017   Procedure: TRANSESOPHAGEAL ECHOCARDIOGRAM (TEE);  Surgeon: Nelva Bush, MD;  Location: [facility];  Service: Cardiovascular;  Laterality: N/A;   TEE WITHOUT CARDIOVERSION N/A 04/01/2020   Procedure: TRANSESOPHAGEAL ECHOCARDIOGRAM (TEE);  Surgeon: [name], MD;  Location: [facility] ENDOSCOPY;  Service: Cardiovascular;  Laterality: N/A;   TEE WITHOUT CARDIOVERSION N/A 04/29/2020   Procedure: TRANSESOPHAGEAL ECHOCARDIOGRAM (TEE);  Surgeon: Rexene Alberts, MD;  Location: [place];  Service: Open Heart Surgery;  Laterality: N/A;   TOTAL KNEE ARTHROPLASTY Left 12/25/2014   Procedure: TOTAL KNEE ARTHROPLASTY;  Surgeon: Thornton Park, MD;  Location: [facility];  Service: Orthopedics;  Laterality: Left;   TUBAL LIGATION       Current Meds  Medication Sig   ALPRAZolam (XANAX) 0.5 MG tablet TAKE 1/2 TABLET BY MOUTH AT BEDTIME AS NEEDED FOR ANXIETY.   amLODipine (NORVASC) 10 MG tablet Take 1 tablet (10 mg total) by mouth daily.   aspirin 81 MG EC tablet Take 81 mg by mouth daily.   atorvastatin (LIPITOR) 80 MG tablet TAKE 1 TABLET BY MOUTH EVERY DAY AT 6 PM   calcium carbonate (TUMS - DOSED IN MG ELEMENTAL CALCIUM) 500 MG chewable tablet Chew 500 mg by mouth daily as needed for indigestion or heartburn.   ferrous [id number] C-folic acid (TRINSICON / FOLTRIN) capsule Take 1 capsule by mouth 2 (two) times daily after a meal.   metoprolol succinate (TOPROL-XL) 100 MG 24 hr tablet Take 1 tablet (100 mg total) by mouth 2 (two) times daily. Take with or immediately following a meal.   omeprazole (PRILOSEC) 40 MG capsule Take 1 capsule (40 mg total) by mouth daily.   oxyCODONE-acetaminophen (PERCOCET) 10-325 MG tablet Take 1 tablet by mouth every 6 (six) hours as needed.   potassium chloride SA (KLOR-CON) 20 MEQ tablet Take 1 tablet (20 mEq total) by mouth 2 (two) times daily.   spironolactone (ALDACTONE) 25 MG tablet Take 0.5 tablets (12.5 mg total) by mouth daily.   torsemide  (DEMADEX) 20 MG tablet TAKE 2 TABLETS(40 MG) BY MOUTH TWICE DAILY   warfarin (COUMADIN) 2 MG tablet TAKE 1 TABLET BY MOUTH DAILY AT 4PM AS INSTRUCTED BY COUMADIN CLINIC FOR LIFETIME (Patient taking differently: Take 1-3 mg by mouth See admin instructions. Take 3 mg on Wed. Take 1 mg daily on Thursday and Sun. Take 2 mg daily on Fri, Sat, Mon, and Tues. Check INR on Wed 9/14)     Allergies:   Morphine, Oxycodone hcl, and Dilaudid [hydromorphone hcl]   Social History   Tobacco Use   Smoking status: Former    Packs/day: 0.50    Years: 20.00    Pack years: 10.00    Types: Cigarettes  Quit date: 01/2016    Years since quitting: 4.8   Smokeless tobacco: Never  Vaping Use   Vaping Use: Never used  Substance Use Topics   Alcohol use: Yes    Comment: rarely   Drug use: No     Family Hx: The patient's family history includes Cancer in her maternal aunt and maternal grandmother; Cancer (age of onset: 2) in her mother; Hypertension in her father and mother; Osteoarthritis in her father.  ROS:   Please see the history of present illness.    Review of Systems  Constitutional: Negative.   Respiratory: Negative.    Cardiovascular: Negative.   Gastrointestinal: Negative.   Musculoskeletal: Negative.   Neurological: Negative.   Psychiatric/Behavioral: Negative.    All other systems reviewed and are negative.  Labs/Other Tests and Data Reviewed:     Recent Labs: 06/02/2020: BNP 116.6 06/13/2020: TSH 0.222 09/06/2020: Magnesium 2.1 10/08/2020: ALT 15 10/22/2020: BUN 20; Creatinine, Ser 1.42; Hemoglobin 11.0; Platelets 369; Potassium 3.8; Sodium 135   Recent Lipid Panel Lab Results  Component Value Date/Time   CHOL 204 (H) 03/07/2020 10:39 AM   TRIG 224 (H) 03/07/2020 10:39 AM   HDL 35 (L) 03/07/2020 10:39 AM   CHOLHDL 5.2 09/19/2018 09:32 AM   LDLCALC 129 (H) 03/07/2020 10:39 AM    Wt Readings from Last 3 Encounters:  11/07/20 239 lb 6 oz (108.6 kg)  10/22/20 234 lb (106.1 kg)   09/26/20 231 lb (104.8 kg)     Objective:    Vital Signs:  BP 130/60 (BP Location: Left Arm, Patient Position: Sitting, Cuff Size: Normal)   Pulse 64   Ht '5\' 4"'  (1.626 m)   Wt 239 lb 6 oz (108.6 kg)   SpO2 98%   BMI 41.09 kg/m   Constitutional:  oriented to person, place, and time. No distress.  HENT:  Head: Grossly normal Eyes:  no discharge. No scleral icterus.  Neck: No JVD, no carotid bruits  Cardiovascular: Regular rate and rhythm, no murmurs appreciated Pulmonary/Chest: Clear to auscultation bilaterally, no wheezes or rails Abdominal: Soft.  no distension.  no tenderness.  Musculoskeletal: Normal range of motion Neurological:  normal muscle tone. Coordination normal. No atrophy Skin: Skin warm and dry Psychiatric: normal affect, pleasant  ASSESSMENT & PLAN:    Mitral valve stenosis With moderate pulmonary hypertension S/p replacement -Continue torsemide 40 daily, extra 40 as needed for recent weight gain leg swelling shortness of breath abdominal swelling Recommended fluid restriction  2.  Paroxysmal atrial fibrillation On anticoagulation, maintaining normal sinus rhythm Continue metoprolol succinate 100 twice daily  3.  Morbid obesity We have encouraged continued exercise, careful diet management in an effort to lose weight.  4. PAF Continue current dose of metoprolol succinate On warfarin, maintaining normal sinus rhythm  5.  Diastolic CHF On torsemide 40 daily, extra 40 in the afternoon for ankle swelling, abdominal distention  6.  Left toe wound Fell in the shower, "gash" her foot Betadine provided, sterile bandages, high concern for cellulitis especially given mechanical valve.  Recommended 5 days Keflex 5 mg 4 times daily With close monitoring for any further signs of infection  Will need amoxicillin prior to dental procedures, prescription provided   Total encounter time more than 25 minutes  Greater than 50% was spent in counseling and  coordination of care with the patient   Signed, Ida Rogue, MD  11/07/2020 4:07 PM    [place]

## 2020-11-10 ENCOUNTER — Other Ambulatory Visit: Payer: Self-pay | Admitting: Internal Medicine

## 2020-11-26 ENCOUNTER — Other Ambulatory Visit: Payer: Self-pay

## 2020-11-26 ENCOUNTER — Ambulatory Visit (INDEPENDENT_AMBULATORY_CARE_PROVIDER_SITE_OTHER): Payer: Self-pay

## 2020-11-26 ENCOUNTER — Encounter: Payer: Self-pay | Admitting: Internal Medicine

## 2020-11-26 DIAGNOSIS — I4891 Unspecified atrial fibrillation: Secondary | ICD-10-CM

## 2020-11-26 DIAGNOSIS — I050 Rheumatic mitral stenosis: Secondary | ICD-10-CM

## 2020-11-26 DIAGNOSIS — Z5181 Encounter for therapeutic drug level monitoring: Secondary | ICD-10-CM

## 2020-11-26 LAB — POCT INR: INR: 4.5 — AB (ref 2.0–3.0)

## 2020-11-26 NOTE — Patient Instructions (Signed)
-   skip warfarin tonight, - have some greens today, - continue warfarin 1 tablet daily except for 1/2 tablet on SUNDAYS & THURSDAYS - Recheck INR in 3 weeks

## 2020-11-27 ENCOUNTER — Other Ambulatory Visit: Payer: Self-pay | Admitting: Family

## 2020-11-27 DIAGNOSIS — E785 Hyperlipidemia, unspecified: Secondary | ICD-10-CM

## 2020-11-30 ENCOUNTER — Other Ambulatory Visit: Payer: Self-pay | Admitting: Family

## 2020-12-01 ENCOUNTER — Telehealth: Payer: Self-pay

## 2020-12-01 NOTE — Telephone Encounter (Signed)
Rx(s) sent to pharmacy electronically.  

## 2020-12-01 NOTE — Telephone Encounter (Signed)
Left vm to schedule pft-Toni

## 2020-12-26 ENCOUNTER — Ambulatory Visit: Payer: 59 | Admitting: Physician Assistant

## 2020-12-29 ENCOUNTER — Telehealth: Payer: Self-pay

## 2020-12-29 NOTE — Telephone Encounter (Signed)
Received disability paperwork from patients employer. Patient advised me that she is under the care of [facility] for a recent surgery and they should be the ones to complete this paperwork. I advised patient I would hold these papers and for her to reach out to her HR department and to let me know if her pcp is required to fill any of it out. Patient will call me back and let me know.

## 2020-12-31 ENCOUNTER — Telehealth: Payer: Self-pay | Admitting: Cardiovascular Disease

## 2020-12-31 NOTE — Telephone Encounter (Signed)
Received forms from [employer] [place]  Patient aware

## 2021-01-07 ENCOUNTER — Other Ambulatory Visit: Payer: Self-pay

## 2021-01-07 ENCOUNTER — Ambulatory Visit (INDEPENDENT_AMBULATORY_CARE_PROVIDER_SITE_OTHER): Payer: Self-pay | Admitting: *Deleted

## 2021-01-07 DIAGNOSIS — I050 Rheumatic mitral stenosis: Secondary | ICD-10-CM

## 2021-01-07 DIAGNOSIS — I4891 Unspecified atrial fibrillation: Secondary | ICD-10-CM

## 2021-01-07 DIAGNOSIS — Z5181 Encounter for therapeutic drug level monitoring: Secondary | ICD-10-CM

## 2021-01-07 LAB — POCT INR: INR: 4.9 — AB (ref 2.0–3.0)

## 2021-01-07 NOTE — Patient Instructions (Signed)
Description   -Hold warfarin 12/21 and 12/22 -Then start taking warfarin 1 tablet daily except for 1/2 a tablet on Sunday, Tuesday and Thursday, Recheck INR in 2 week.

## 2021-01-11 ENCOUNTER — Other Ambulatory Visit: Payer: Self-pay | Admitting: Internal Medicine

## 2021-01-16 ENCOUNTER — Telehealth: Payer: Self-pay

## 2021-01-16 NOTE — Telephone Encounter (Signed)
Completed medical records for patients long term disability claim and faxed records to [employer] at [phone].

## 2021-01-21 ENCOUNTER — Ambulatory Visit (INDEPENDENT_AMBULATORY_CARE_PROVIDER_SITE_OTHER): Payer: Self-pay

## 2021-01-21 ENCOUNTER — Other Ambulatory Visit: Payer: Self-pay

## 2021-01-21 DIAGNOSIS — Z5181 Encounter for therapeutic drug level monitoring: Secondary | ICD-10-CM

## 2021-01-21 DIAGNOSIS — I4891 Unspecified atrial fibrillation: Secondary | ICD-10-CM

## 2021-01-21 DIAGNOSIS — I050 Rheumatic mitral stenosis: Secondary | ICD-10-CM

## 2021-01-21 LAB — POCT INR: INR: 4.4 — AB (ref 2.0–3.0)

## 2021-01-21 NOTE — Patient Instructions (Signed)
-   skip warfarin tonight, then - START NEW dosage warfarin 1/2 tablet daily except for 1 tablet on MONDAYS & FRIDAYS - Recheck INR in 2 weeks

## 2021-01-22 ENCOUNTER — Encounter: Payer: Self-pay | Admitting: Internal Medicine

## 2021-01-27 ENCOUNTER — Telehealth: Payer: Self-pay

## 2021-01-27 NOTE — Telephone Encounter (Signed)
Patient is having her disability paperwork filled out by [facility].

## 2021-01-28 ENCOUNTER — Encounter: Payer: Self-pay | Admitting: Internal Medicine

## 2021-01-29 ENCOUNTER — Ambulatory Visit: Payer: Self-pay | Admitting: Physician Assistant

## 2021-01-29 DIAGNOSIS — Z0289 Encounter for other administrative examinations: Secondary | ICD-10-CM

## 2021-01-31 ENCOUNTER — Other Ambulatory Visit: Payer: Self-pay | Admitting: Internal Medicine

## 2021-02-02 NOTE — Telephone Encounter (Signed)
Refill Request.  

## 2021-02-10 ENCOUNTER — Other Ambulatory Visit: Payer: Self-pay | Admitting: Family

## 2021-02-10 DIAGNOSIS — Z954 Presence of other heart-valve replacement: Secondary | ICD-10-CM

## 2021-02-10 DIAGNOSIS — I050 Rheumatic mitral stenosis: Secondary | ICD-10-CM

## 2021-02-10 DIAGNOSIS — I480 Paroxysmal atrial fibrillation: Secondary | ICD-10-CM

## 2021-02-10 DIAGNOSIS — I5032 Chronic diastolic (congestive) heart failure: Secondary | ICD-10-CM

## 2021-02-16 ENCOUNTER — Encounter: Payer: Self-pay | Admitting: Internal Medicine

## 2021-02-16 ENCOUNTER — Other Ambulatory Visit: Payer: Self-pay

## 2021-02-16 ENCOUNTER — Ambulatory Visit (INDEPENDENT_AMBULATORY_CARE_PROVIDER_SITE_OTHER): Payer: Self-pay | Admitting: Physician Assistant

## 2021-02-16 ENCOUNTER — Encounter: Payer: Self-pay | Admitting: Physician Assistant

## 2021-02-16 DIAGNOSIS — F419 Anxiety disorder, unspecified: Secondary | ICD-10-CM | POA: Diagnosis not present

## 2021-02-16 DIAGNOSIS — Z9889 Other specified postprocedural states: Secondary | ICD-10-CM

## 2021-02-16 DIAGNOSIS — E538 Deficiency of other specified B group vitamins: Secondary | ICD-10-CM

## 2021-02-16 DIAGNOSIS — E782 Mixed hyperlipidemia: Secondary | ICD-10-CM

## 2021-02-16 DIAGNOSIS — I10 Essential (primary) hypertension: Secondary | ICD-10-CM | POA: Diagnosis not present

## 2021-02-16 DIAGNOSIS — B379 Candidiasis, unspecified: Secondary | ICD-10-CM

## 2021-02-16 DIAGNOSIS — Z79899 Other long term (current) drug therapy: Secondary | ICD-10-CM

## 2021-02-16 DIAGNOSIS — R000 Tachycardia, unspecified: Secondary | ICD-10-CM

## 2021-02-16 DIAGNOSIS — Z954 Presence of other heart-valve replacement: Secondary | ICD-10-CM | POA: Diagnosis not present

## 2021-02-16 DIAGNOSIS — N1832 Chronic kidney disease, stage 3b: Secondary | ICD-10-CM

## 2021-02-16 DIAGNOSIS — R42 Dizziness and giddiness: Secondary | ICD-10-CM

## 2021-02-16 DIAGNOSIS — E559 Vitamin D deficiency, unspecified: Secondary | ICD-10-CM

## 2021-02-16 DIAGNOSIS — R5383 Other fatigue: Secondary | ICD-10-CM

## 2021-02-16 DIAGNOSIS — R413 Other amnesia: Secondary | ICD-10-CM

## 2021-02-16 DIAGNOSIS — Z8679 Personal history of other diseases of the circulatory system: Secondary | ICD-10-CM

## 2021-02-16 MED ORDER — FLUCONAZOLE 150 MG PO TABS: 150.0000 mg | ORAL_TABLET | Freq: Once | ORAL | 0 refills | Status: AC

## 2021-02-16 MED ORDER — ALPRAZOLAM 0.5 MG PO TABS: ORAL_TABLET | ORAL | 1 refills | Status: DC

## 2021-02-16 NOTE — Progress Notes (Signed)
[facility] [place], [place] [address]  Internal MEDICINE  Office Visit Note  Patient Name: Leslie [name]  [id number]  [id number]  Date of Service: 02/18/2021  Chief Complaint  Patient presents with   Follow-up   Hyperlipidemia   Hypertension   Gastroesophageal Reflux   Shortness of Breath   Sore Throat    Has some scratchy throat that started a few days ago    HPI Pt is here for routine follow up and is doing well -left arm still painful, followed by [facility]; not in therapy currently due to insurance changes and working to see if she can restart -Bp stable -some vaginal itching and odor, and some discharge, thinks she has a yeast infection -some postnasal drip and scratchy throat, using some flonase -Taking 1/2 xanax about 3 days per week as needed -Due for routine labs other than those done at outside providers -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   aspirin 81 MG EC tablet Take 81 mg by mouth daily.   atorvastatin (LIPITOR) 80 MG tablet TAKE 1 TABLET BY MOUTH EVERY DAY AT 6 PM   calcium carbonate (TUMS - DOSED IN MG ELEMENTAL CALCIUM) 500 MG chewable tablet Chew 500 mg by mouth daily as needed for indigestion or heartburn.   cephALEXin (KEFLEX) 500 MG capsule Take 1 capsule (500 mg total) by mouth 4 (four) times daily. For 5 days   [EXPIRED] fluconazole (DIFLUCAN) 150 MG tablet Take 1 tablet (150 mg total) by mouth once for 1 dose.   omeprazole (PRILOSEC) 40 MG capsule Take 1 capsule (40 mg total) by mouth daily.   oxyCODONE-acetaminophen (PERCOCET) 10-325 MG tablet Take 1 tablet by mouth every 6 (six) hours as needed.   potassium chloride SA (KLOR-CON) 20 MEQ tablet  TAKE 1 TABLET(20 MEQ) BY MOUTH TWICE DAILY   spironolactone (ALDACTONE) 25 MG tablet Take 0.5 tablets (12.5 mg total) by mouth daily.   torsemide (DEMADEX) 20 MG tablet TAKE 2 TABLETS(40 MG) BY MOUTH TWICE DAILY   warfarin (COUMADIN) 2 MG tablet Take 1 tablet (2 mg total) by mouth as directed. Take 1/2-1 tablet daily as directed by the anti-coag clnic.   [DISCONTINUED] ALPRAZolam (XANAX) 0.5 MG tablet TAKE 1/2 TABLET BY MOUTH AT BEDTIME AS NEEDED FOR ANXIETY.   [DISCONTINUED] FEROCON capsule TAKE ONE CAPSULE BY MOUTH TWICE DAILY AFTER MEALS   ALPRAZolam (XANAX) 0.5 MG tablet TAKE 1/2 TABLET BY MOUTH AT BEDTIME AS NEEDED FOR ANXIETY.   metoprolol succinate (TOPROL-XL) 100 MG 24 hr tablet Take 1 tablet (100 mg total) by mouth 2 (two) times daily. Take with or immediately following a meal.   No facility-administered encounter medications on file as of 02/16/2021.    Surgical History: Past Surgical History:  Procedure Laterality Date   ABDOMINAL HYSTERECTOMY     total   ABDOMINAL HYSTERECTOMY     BREAST BIOPSY Bilateral 2012   BREAST BIOPSY Right 08-07-12   fibroadenomatous changes and columnar cells   BREAST BIOPSY  02/03/2015   stereo byrnett   BUBBLE STUDY  04/01/2020   Procedure: BUBBLE STUDY;  Surgeon: [name], MD;  Location: [place];  Service: Cardiovascular;;   CARDIAC CATHETERIZATION  CHOLECYSTECTOMY N/A 02/27/2016   Procedure: LAPAROSCOPIC CHOLECYSTECTOMY;  Surgeon: Christene Lye, MD;  Location: [facility];  Service: General;  Laterality: N/A;   CLIPPING OF ATRIAL APPENDAGE  04/29/2020   Procedure: CLIPPING OF ATRIAL APPENDAGE USING ATRICURE  PRO2 CLIP SIZE 45MM;  Surgeon: Rexene Alberts, MD;  Location: [facility] OR;  Service: Open Heart Surgery;;   COLONOSCOPY WITH PROPOFOL N/A 09/26/2018   Procedure: COLONOSCOPY WITH PROPOFOL;  Surgeon: Lucilla Lame, MD;  Location: [facility] ENDOSCOPY;  Service: Endoscopy;  Laterality: N/A;   CYSTOSCOPY W/ RETROGRADES Right 08/18/2018    Procedure: CYSTOSCOPY WITH RETROGRADE PYELOGRAM;  Surgeon: Billey Co, MD;  Location: [facility];  Service: Urology;  Laterality: Right;   CYSTOSCOPY/URETEROSCOPY/HOLMIUM LASER/STENT PLACEMENT Right 08/18/2018   Procedure: CYSTOSCOPY/URETEROSCOPY/STENT PLACEMENT;  Surgeon: Billey Co, MD;  Location: [facility];  Service: Urology;  Laterality: Right;   DIAGNOSTIC LAPAROSCOPY     ESOPHAGOGASTRODUODENOSCOPY (EGD) WITH PROPOFOL N/A 09/26/2018   Procedure: ESOPHAGOGASTRODUODENOSCOPY (EGD) WITH PROPOFOL;  Surgeon: Lucilla Lame, MD;  Location: [facility] ENDOSCOPY;  Service: Endoscopy;  Laterality: N/A;   GIVENS CAPSULE STUDY N/A 11/03/2018   Procedure: GIVENS CAPSULE STUDY;  Surgeon: Lucilla Lame, MD;  [facility];  Service: Endoscopy;  Laterality: N/A;   JOINT REPLACEMENT Left    knee   KNEE ARTHROPLASTY Right 04/09/2019   Procedure: COMPUTER ASSISTED TOTAL KNEE ARTHROPLASTY;  Surgeon: Dereck Leep, MD;  Location: [facility];  Service: Orthopedics;  Laterality: Right;   KNEE CLOSED REDUCTION Left 04/15/2015   Procedure: CLOSED MANIPULATION KNEE;  Surgeon: Thornton Park, MD;  Location: [facility];  Service: Orthopedics;  Laterality: Left;   KNEE SURGERY     MAZE N/A 04/29/2020   Procedure: MAZE;  Surgeon: Rexene Alberts, MD;  Location: [place];  Service: Open Heart Surgery;  Laterality: N/A;   MITRAL VALVE REPLACEMENT N/A 04/29/2020   Procedure: MITRAL VALVE (MV) REPLACEMENT USING ON-X VALVE SIZE 27/29MM;  Surgeon: Rexene Alberts, MD;  Location: [place];  Service: Open Heart Surgery;  Laterality: N/A;   MULTIPLE EXTRACTIONS WITH ALVEOLOPLASTY N/A 02/28/2020   Procedure: MULTIPLE EXTRACTION WITH ALVEOLOPLASTY;  Surgeon: [name], DMD;  Location: [place];  Service: Dentistry;  Laterality: N/A;   RIGHT/LEFT HEART CATH AND CORONARY ANGIOGRAPHY Bilateral 09/06/2019   Procedure: RIGHT/LEFT HEART CATH AND CORONARY ANGIOGRAPHY;  Surgeon: Minna Merritts, MD;  Location: [place] CV LAB;   Service: Cardiovascular;  Laterality: Bilateral;   TEE WITHOUT CARDIOVERSION N/A 09/13/2017   Procedure: TRANSESOPHAGEAL ECHOCARDIOGRAM (TEE);  Surgeon: Nelva Bush, MD;  Location: [facility];  Service: Cardiovascular;  Laterality: N/A;   TEE WITHOUT CARDIOVERSION N/A 04/01/2020   Procedure: TRANSESOPHAGEAL ECHOCARDIOGRAM (TEE);  Surgeon: [name], MD;  Location: [facility] ENDOSCOPY;  Service: Cardiovascular;  Laterality: N/A;   TEE WITHOUT CARDIOVERSION N/A 04/29/2020   Procedure: TRANSESOPHAGEAL ECHOCARDIOGRAM (TEE);  Surgeon: Rexene Alberts, MD;  Location: [place];  Service: Open Heart Surgery;  Laterality: N/A;   TOTAL KNEE ARTHROPLASTY Left 12/25/2014   Procedure: TOTAL KNEE ARTHROPLASTY;  Surgeon: Thornton Park, MD;  Location: [facility];  Service: Orthopedics;  Laterality: Left;   TUBAL LIGATION      Medical History: Past Medical History:  Diagnosis Date   (HFpEF) heart failure with preserved ejection fraction ([place])    a. 08/2017 Echo: EF 55-60%.  Grade 2 diastolic dysfunction; b. 01/6965 Echo: EF 50-55%, no rwma, Nl RV fxn, nl fxn'ing mech MV.   Allergy    Anemia    Anxiety  BRCA negative 03/22/2013   Bronchitis 02/19/2016   ON LEVAQUIN PO   Chest tightness    a. 08/2019 Cath: nl cors.   Cigarette smoker 09/11/2017   8-10 day   Dysrhythmia    GERD (gastroesophageal reflux disease)    History of kidney stones    Hyperlipidemia    Hypertension    Interstitial lung disease ([place])    a. CT 2013 b. 02/2018 CXR noted recurrent intersistial changes ILD vs chronic bronchitis   Moderate mitral stenosis    a.  08/2017 TEE: EF 60 to 65%.  Moderate mitral stenosis.  Mean gradient 14 mmHg.  Valve area 2.59 cm by planimetry, 2.72 cm by pressure half-time.   PAF (paroxysmal atrial fibrillation) ([place])    a. 08/2017 s/p TEE/DCCV; b. CHA2DS2VASc = 2-->warfarin.   Pneumonia    S/P Maze operation for atrial fibrillation 04/29/2020   Complete bilateral atrial lesion set using bipolar  radiofrequency and cryothermy ablation with clipping of LA appendage   S/P mitral valve replacement with Onyx bileaflet mechanical valve 04/29/2020   a. 04/2020 s/p 27/29 mm Onyx mech mitral valve-->chronic coumadin; b. 05/2020 Echo: Nl fxn'ing mech MV.    Family History: Family History  Problem Relation Age of Onset   Cancer Mother 63       breast   Hypertension Mother    Cancer Maternal Aunt        breast   Cancer Maternal Grandmother        breast   Osteoarthritis Father    Hypertension Father     Social History   Socioeconomic History   Marital status: Married    Spouse name: Not on file   Number of children: Not on file   Years of education: Not on file   Highest education level: Not on file  Occupational History   Not on file  Tobacco Use   Smoking status: Former    Packs/day: 0.50    Years: 20.00    Pack years: 10.00    Types: Cigarettes    Quit date: 01/2016    Years since quitting: 5.0   Smokeless tobacco: Never  Vaping Use   Vaping Use: Never used  Substance and Sexual Activity   Alcohol use: Yes    Comment: rarely   Drug use: No   Sexual activity: Not on file  Other Topics Concern   Not on file  Social History Narrative   Not on file   Social Determinants of Health   Financial Resource Strain: Low Risk    Difficulty of Paying Living Expenses: Not very hard  Food Insecurity: No Food Insecurity   Worried About Running Out of Food in the Last Year: Never true   [place] in the Last Year: Never true  Transportation Needs: No Transportation Needs   Lack of Transportation (Medical): No   Lack of Transportation (Non-Medical): No  Physical Activity: Not on file  Stress: Not on file  Social Connections: Not on file  Intimate Partner Violence: Not on file      Review of Systems  Constitutional:  Negative for chills, fatigue and unexpected weight change.  HENT:  Positive for postnasal drip. Negative for congestion, rhinorrhea, sneezing and  sore throat.   Eyes:  Negative for redness.  Respiratory:  Negative for cough, chest tightness and shortness of breath.   Cardiovascular:  Negative for chest pain and palpitations.  Gastrointestinal:  Negative for abdominal pain, constipation, diarrhea, nausea and vomiting.  Genitourinary:  Positive for vaginal discharge. Negative for dysuria and frequency.  Musculoskeletal:  Positive for arthralgias. Negative for back pain, joint swelling and neck pain.  Skin:  Negative for rash.  Neurological: Negative.  Negative for tremors and numbness.  Hematological:  Negative for adenopathy. Does not bruise/bleed easily.  Psychiatric/Behavioral:  Negative for behavioral problems (Depression), sleep disturbance and suicidal ideas. The patient is not nervous/anxious.    Vital Signs: BP 118/71    Pulse 63    Temp 98.3 F (36.8 C)    Resp 16    Ht '5\' 4"'  (1.626 m)    Wt 240 lb 12.8 oz (109.2 kg)    SpO2 98%    BMI 41.33 kg/m    Physical Exam Vitals and nursing note reviewed.  Constitutional:      General: She is not in acute distress.    Appearance: She is well-developed. She is obese. She is not diaphoretic.  HENT:     Head: Normocephalic and atraumatic.     Nose: Rhinorrhea present.     Mouth/Throat:     Pharynx: Posterior oropharyngeal erythema present. No oropharyngeal exudate.  Eyes:     Pupils: Pupils are equal, round, and reactive to light.  Neck:     Thyroid: No thyromegaly.     Vascular: No JVD.     Trachea: No tracheal deviation.  Cardiovascular:     Rate and Rhythm: Normal rate and regular rhythm.     Heart sounds: Normal heart sounds. No murmur heard.   No friction rub. No gallop.  Pulmonary:     Effort: Pulmonary effort is normal. No respiratory distress.     Breath sounds: No wheezing or rales.  Chest:     Chest wall: No tenderness.  Abdominal:     General: Bowel sounds are normal.     Palpations: Abdomen is soft.     Tenderness: There is no right CVA tenderness or left  CVA tenderness.  Musculoskeletal:        General: Normal range of motion.     Cervical back: Normal range of motion and neck supple.  Lymphadenopathy:     Cervical: No cervical adenopathy.  Skin:    General: Skin is warm and dry.  Neurological:     Mental Status: She is alert and oriented to person, place, and time.     Cranial Nerves: No cranial nerve deficit.  Psychiatric:        Behavior: Behavior normal.        Thought Content: Thought content normal.        Judgment: Judgment normal.       Assessment/Plan: 1. Primary hypertension Well-controlled, continue current medications  2. Anxiety May continue Xanax as needed - ALPRAZolam (XANAX) 0.5 MG tablet; TAKE 1/2 TABLET BY MOUTH AT BEDTIME AS NEEDED FOR ANXIETY.  Dispense: 30 tablet; Refill: 1  3. S/P mitral valve replacement with Onyx bileaflet mechanical valve Followed by cardiology  4. S/P Maze operation for atrial fibrillation Followed by cardiology  5. Stage 3b chronic kidney disease ([place]) Followed by nephrology  6. Yeast infection - fluconazole (DIFLUCAN) 150 MG tablet; Take 1 tablet (150 mg total) by mouth once for 1 dose.  Dispense: 1 tablet; Refill: 0  7. B12 deficiency - B12 and Folate Panel  8. Vitamin D deficiency - VITAMIN D 25 Hydroxy (Vit-D Deficiency, Fractures)  9. Mixed hyperlipidemia - Lipid Panel With LDL/HDL Ratio  10. Other fatigue - TSH + free T4   General Counseling: Zayah verbalizes  understanding of the findings of todays visit and agrees with plan of treatment. I have discussed any further diagnostic evaluation that may be needed or ordered today. We also reviewed her medications today. she has been encouraged to call the office with any questions or concerns that should arise related to todays visit.    Orders Placed This Encounter  Procedures   TSH + free T4   Lipid Panel With LDL/HDL Ratio   VITAMIN D 25 Hydroxy (Vit-D Deficiency, Fractures)   B12 and Folate Panel     Meds ordered this encounter  Medications   fluconazole (DIFLUCAN) 150 MG tablet    Sig: Take 1 tablet (150 mg total) by mouth once for 1 dose.    Dispense:  1 tablet    Refill:  0   ALPRAZolam (XANAX) 0.5 MG tablet    Sig: TAKE 1/2 TABLET BY MOUTH AT BEDTIME AS NEEDED FOR ANXIETY.    Dispense:  30 tablet    Refill:  1    This patient was seen by Drema Dallas, PA-C in collaboration with Dr. Clayborn Bigness as a part of collaborative care agreement.   Total time spent:30 Minutes Time spent includes review of chart, medications, test results, and follow up plan with the patient.      Dr Lavera Guise Internal medicine

## 2021-02-17 ENCOUNTER — Other Ambulatory Visit: Payer: Self-pay | Admitting: Internal Medicine

## 2021-02-18 ENCOUNTER — Other Ambulatory Visit: Payer: Self-pay

## 2021-02-18 ENCOUNTER — Ambulatory Visit (INDEPENDENT_AMBULATORY_CARE_PROVIDER_SITE_OTHER): Payer: 59

## 2021-02-18 ENCOUNTER — Encounter: Payer: Self-pay | Admitting: Internal Medicine

## 2021-02-18 DIAGNOSIS — Z5181 Encounter for therapeutic drug level monitoring: Secondary | ICD-10-CM

## 2021-02-18 DIAGNOSIS — I4891 Unspecified atrial fibrillation: Secondary | ICD-10-CM | POA: Diagnosis not present

## 2021-02-18 DIAGNOSIS — I050 Rheumatic mitral stenosis: Secondary | ICD-10-CM

## 2021-02-18 LAB — POCT INR: INR: 3.9 — AB (ref 2.0–3.0)

## 2021-02-18 NOTE — Patient Instructions (Signed)
-   START NEW dosage warfarin 1/2 tablet daily - Recheck INR in 2 weeks

## 2021-03-04 ENCOUNTER — Other Ambulatory Visit: Payer: Self-pay

## 2021-03-04 ENCOUNTER — Ambulatory Visit (INDEPENDENT_AMBULATORY_CARE_PROVIDER_SITE_OTHER): Payer: 59

## 2021-03-04 DIAGNOSIS — Z5181 Encounter for therapeutic drug level monitoring: Secondary | ICD-10-CM

## 2021-03-04 DIAGNOSIS — I050 Rheumatic mitral stenosis: Secondary | ICD-10-CM | POA: Diagnosis not present

## 2021-03-04 DIAGNOSIS — I4891 Unspecified atrial fibrillation: Secondary | ICD-10-CM

## 2021-03-04 LAB — POCT INR: INR: 1.9 — AB (ref 2.0–3.0)

## 2021-03-04 NOTE — Patient Instructions (Signed)
-   START NEW dosage warfarin 1/2 tablet daily EXCEPT 1 whole tablet on WEDNESDAYS - Recheck INR in 2 weeks

## 2021-03-06 ENCOUNTER — Telehealth: Payer: Self-pay

## 2021-03-06 ENCOUNTER — Other Ambulatory Visit: Payer: Self-pay | Admitting: Orthopedic Surgery

## 2021-03-06 NOTE — Telephone Encounter (Signed)
Completed medical records for DDS  Mailed to [place] [id number]

## 2021-03-09 ENCOUNTER — Encounter: Payer: Self-pay | Admitting: Physician Assistant

## 2021-03-09 ENCOUNTER — Other Ambulatory Visit: Payer: Self-pay | Admitting: Orthopedic Surgery

## 2021-03-09 ENCOUNTER — Other Ambulatory Visit: Payer: Self-pay | Admitting: Physician Assistant

## 2021-03-09 ENCOUNTER — Telehealth (INDEPENDENT_AMBULATORY_CARE_PROVIDER_SITE_OTHER): Payer: Self-pay | Admitting: Physician Assistant

## 2021-03-09 VITALS — BP 142/79 | HR 65 | Resp 16 | Ht 64.0 in

## 2021-03-09 DIAGNOSIS — J069 Acute upper respiratory infection, unspecified: Secondary | ICD-10-CM | POA: Diagnosis not present

## 2021-03-09 DIAGNOSIS — F419 Anxiety disorder, unspecified: Secondary | ICD-10-CM

## 2021-03-09 DIAGNOSIS — M25522 Pain in left elbow: Secondary | ICD-10-CM

## 2021-03-09 MED ORDER — LEVOFLOXACIN 500 MG PO TABS
500.0000 mg | ORAL_TABLET | Freq: Every day | ORAL | 0 refills | Status: AC
Start: 2021-03-09 — End: 2021-03-16

## 2021-03-09 NOTE — Progress Notes (Signed)
[facility] [place], [place] [address]  Internal MEDICINE  Telephone Visit  Patient Name: Leslie [name]  [id number]  [id number]  Date of Service: 03/15/2021  I connected with the patient at 1:59 by telephone and verified the patients identity using two identifiers.   I discussed the limitations, risks, security and privacy concerns of performing an evaluation and management service by telephone and the availability of in person appointments. I also discussed with the patient that there may be a patient responsible charge related to the service.  The patient expressed understanding and agrees to proceed.    Chief Complaint  Patient presents with   Telephone Screen    [id number]    Telephone Assessment   Cough   Shortness of Breath    HPI Pt is here for virtual sick visit -She has been having SOB, coughing, sinuses draining, sore throat, headaches, some chills.  -Covid test negative -Symptoms started last Wednesday.  -Has been taking mucinex and tylenol and flonase.  -Feels like it is in her chest now. Some wheezing. Did use inhaler once.  Current Medication: Outpatient Encounter Medications as of 03/09/2021  Medication Sig   ALPRAZolam (XANAX) 0.5 MG tablet TAKE 1/2 TABLET BY MOUTH AT BEDTIME AS NEEDED FOR ANXIETY.   amLODipine (NORVASC) 10 MG tablet TAKE 1 TABLET(10 MG) BY MOUTH DAILY   aspirin 81 MG EC tablet Take 81 mg by mouth daily.   atorvastatin (LIPITOR) 80 MG tablet TAKE 1 TABLET BY MOUTH EVERY DAY AT 6 PM   calcium carbonate (TUMS - DOSED IN MG ELEMENTAL CALCIUM) 500 MG chewable tablet Chew 500 mg by mouth daily as needed for indigestion or heartburn.   FEROCON capsule TAKE ONE CAPSULE BY MOUTH TWICE DAILY AFTER MEALS   levofloxacin (LEVAQUIN) 500 MG tablet Take 1 tablet (500 mg total) by mouth daily for 7 days.   omeprazole (PRILOSEC) 40 MG capsule Take 1 capsule (40 mg total) by mouth daily.   oxyCODONE-acetaminophen (PERCOCET) 10-325 MG  tablet Take 1 tablet by mouth every 6 (six) hours as needed.   potassium chloride SA (KLOR-CON) 20 MEQ tablet TAKE 1 TABLET(20 MEQ) BY MOUTH TWICE DAILY   spironolactone (ALDACTONE) 25 MG tablet Take 0.5 tablets (12.5 mg total) by mouth daily.   torsemide (DEMADEX) 20 MG tablet TAKE 2 TABLETS(40 MG) BY MOUTH TWICE DAILY   warfarin (COUMADIN) 2 MG tablet Take 1 tablet (2 mg total) by mouth as directed. Take 1/2-1 tablet daily as directed by the anti-coag clnic.   [DISCONTINUED] amoxicillin (AMOXIL) 500 MG tablet Take 4 tablets (2,000 mg total) by mouth 2 (two) times daily.   [DISCONTINUED] cephALEXin (KEFLEX) 500 MG capsule Take 1 capsule (500 mg total) by mouth 4 (four) times daily. For 5 days   metoprolol succinate (TOPROL-XL) 100 MG 24 hr tablet Take 1 tablet (100 mg total) by mouth 2 (two) times daily. Take with or immediately following a meal.   No facility-administered encounter medications on file as of 03/09/2021.    Surgical History: Past Surgical History:  Procedure Laterality Date   ABDOMINAL HYSTERECTOMY     total   ABDOMINAL HYSTERECTOMY     BREAST BIOPSY Bilateral 2012   BREAST BIOPSY Right 08-07-12   fibroadenomatous changes and columnar cells   BREAST BIOPSY  02/03/2015   stereo byrnett   BUBBLE STUDY  04/01/2020   Procedure: BUBBLE STUDY;  Surgeon: [name], MD;  Location: [place];  Service: Cardiovascular;;   CARDIAC CATHETERIZATION  CHOLECYSTECTOMY N/A 02/27/2016   Procedure: LAPAROSCOPIC CHOLECYSTECTOMY;  Surgeon: Christene Lye, MD;  Location: [facility];  Service: General;  Laterality: N/A;   CLIPPING OF ATRIAL APPENDAGE  04/29/2020   Procedure: CLIPPING OF ATRIAL APPENDAGE USING ATRICURE  PRO2 CLIP SIZE 45MM;  Surgeon: Rexene Alberts, MD;  Location: [facility];  Service: Open Heart Surgery;;   COLONOSCOPY WITH PROPOFOL N/A 09/26/2018   Procedure: COLONOSCOPY WITH PROPOFOL;  Surgeon: Lucilla Lame, MD;  Location: [facility] ENDOSCOPY;  Service: Endoscopy;   Laterality: N/A;   CYSTOSCOPY W/ RETROGRADES Right 08/18/2018   Procedure: CYSTOSCOPY WITH RETROGRADE PYELOGRAM;  Surgeon: Billey Co, MD;  Location: [facility];  Service: Urology;  Laterality: Right;   CYSTOSCOPY/URETEROSCOPY/HOLMIUM LASER/STENT PLACEMENT Right 08/18/2018   Procedure: CYSTOSCOPY/URETEROSCOPY/STENT PLACEMENT;  Surgeon: Billey Co, MD;  Location: [facility];  Service: Urology;  Laterality: Right;   DIAGNOSTIC LAPAROSCOPY     ESOPHAGOGASTRODUODENOSCOPY (EGD) WITH PROPOFOL N/A 09/26/2018   Procedure: ESOPHAGOGASTRODUODENOSCOPY (EGD) WITH PROPOFOL;  Surgeon: Lucilla Lame, MD;  Location: [facility]OS[facility];  Service: Endoscopy;  Laterality: N/A;   GIVENS CAPSULE STUDY N/A 11/03/2018   Procedure: GIVENS CAPSULE STUDY;  Surgeon: Lucilla Lame, MD;  Location: [facility] ENDOSCOPY;  Service: Endoscopy;  Laterality: N/A;   JOINT REPLACEMENT Left    knee   KNEE ARTHROPLASTY Right 04/09/2019   Procedure: COMPUTER ASSISTED TOTAL KNEE ARTHROPLASTY;  Surgeon: Dereck Leep, MD;  Location: [facility];  Service: Orthopedics;  Laterality: Right;   KNEE CLOSED REDUCTION Left 04/15/2015   Procedure: CLOSED MANIPULATION KNEE;  Surgeon: Thornton Park, MD;  Location: [facility];  Service: Orthopedics;  Laterality: Left;   KNEE SURGERY     MAZE N/A 04/29/2020   Procedure: MAZE;  Surgeon: Rexene Alberts, MD;  Location: [place];  Service: Open Heart Surgery;  Laterality: N/A;   MITRAL VALVE REPLACEMENT N/A 04/29/2020   Procedure: MITRAL VALVE (MV) REPLACEMENT USING ON-X VALVE SIZE 27/29MM;  Surgeon: Rexene Alberts, MD;  Location: [place];  Service: Open Heart Surgery;  Laterality: N/A;   MULTIPLE EXTRACTIONS WITH ALVEOLOPLASTY N/A 02/28/2020   Procedure: MULTIPLE EXTRACTION WITH ALVEOLOPLASTY;  Surgeon: [name], DMD;  Location: [place];  Service: Dentistry;  Laterality: N/A;   RIGHT/LEFT HEART CATH AND CORONARY ANGIOGRAPHY Bilateral 09/06/2019   Procedure: RIGHT/LEFT HEART CATH AND CORONARY ANGIOGRAPHY;   Surgeon: Minna Merritts, MD;  Location: [place] [facility];  Service: Cardiovascular;  Laterality: Bilateral;   TEE WITHOUT CARDIOVERSION N/A 09/13/2017   Procedure: TRANSESOPHAGEAL ECHOCARDIOGRAM (TEE);  Surgeon: Nelva Bush, MD;  Location: [facility];  Service: Cardiovascular;  Laterality: N/A;   TEE WITHOUT CARDIOVERSION N/A 04/01/2020   Procedure: TRANSESOPHAGEAL ECHOCARDIOGRAM (TEE);  Surgeon: [name], MD;  Location: [facility] ENDOSCOPY;  Service: Cardiovascular;  Laterality: N/A;   TEE WITHOUT CARDIOVERSION N/A 04/29/2020   Procedure: TRANSESOPHAGEAL ECHOCARDIOGRAM (TEE);  Surgeon: Rexene Alberts, MD;  Location: [place];  Service: Open Heart Surgery;  Laterality: N/A;   TOTAL KNEE ARTHROPLASTY Left 12/25/2014   Procedure: TOTAL KNEE ARTHROPLASTY;  Surgeon: Thornton Park, MD;  Location: [facility];  Service: Orthopedics;  Laterality: Left;   TUBAL LIGATION      Medical History: Past Medical History:  Diagnosis Date   (HFpEF) heart failure with preserved ejection fraction ([place])    a. 08/2017 Echo: EF 55-60%.  Grade 2 diastolic dysfunction; b. 08/2503 Echo: EF 50-55%, no rwma, Nl RV fxn, nl fxn'ing mech MV.   Allergy    Anemia    Anxiety  BRCA negative 03/22/2013   Bronchitis 02/19/2016   ON LEVAQUIN PO   Chest tightness    a. 08/2019 Cath: nl cors.   Cigarette smoker 09/11/2017   8-10 day   Dysrhythmia    GERD (gastroesophageal reflux disease)    History of kidney stones    Hyperlipidemia    Hypertension    Interstitial lung disease ([place])    a. CT 2013 b. 02/2018 CXR noted recurrent intersistial changes ILD vs chronic bronchitis   Moderate mitral stenosis    a.  08/2017 TEE: EF 60 to 65%.  Moderate mitral stenosis.  Mean gradient 14 mmHg.  Valve area 2.59 cm by planimetry, 2.72 cm by pressure half-time.   PAF (paroxysmal atrial fibrillation) ([place])    a. 08/2017 s/p TEE/DCCV; b. CHA2DS2VASc = 2-->warfarin.   Pneumonia    S/P Maze operation for atrial fibrillation  04/29/2020   Complete bilateral atrial lesion set using bipolar radiofrequency and cryothermy ablation with clipping of LA appendage   S/P mitral valve replacement with Onyx bileaflet mechanical valve 04/29/2020   a. 04/2020 s/p 27/29 mm Onyx mech mitral valve-->chronic coumadin; b. 05/2020 Echo: Nl fxn'ing mech MV.    Family History: Family History  Problem Relation Age of Onset   Cancer Mother 51       breast   Hypertension Mother    Cancer Maternal Aunt        breast   Cancer Maternal Grandmother        breast   Osteoarthritis Father    Hypertension Father     Social History   Socioeconomic History   Marital status: Married    Spouse name: Not on file   Number of children: Not on file   Years of education: Not on file   Highest education level: Not on file  Occupational History   Not on file  Tobacco Use   Smoking status: Former    Packs/day: 0.50    Years: 20.00    Pack years: 10.00    Types: Cigarettes    Quit date: 01/2016    Years since quitting: 5.1   Smokeless tobacco: Never  Vaping Use   Vaping Use: Never used  Substance and Sexual Activity   Alcohol use: Yes    Comment: rarely   Drug use: No   Sexual activity: Not on file  Other Topics Concern   Not on file  Social History Narrative   Not on file   Social Determinants of Health   Financial Resource Strain: Low Risk    Difficulty of Paying Living Expenses: Not very hard  Food Insecurity: No Food Insecurity   Worried About Running Out of Food in the Last Year: Never true   [place] in the Last Year: Never true  Transportation Needs: No Transportation Needs   Lack of Transportation (Medical): No   Lack of Transportation (Non-Medical): No  Physical Activity: Not on file  Stress: Not on file  Social Connections: Not on file  Intimate Partner Violence: Not on file      Review of Systems  Constitutional:  Positive for chills. Negative for fatigue and fever.  HENT:  Positive for  congestion, postnasal drip and sore throat. Negative for mouth sores.   Respiratory:  Positive for cough, shortness of breath and wheezing.   Cardiovascular:  Negative for chest pain.  Genitourinary:  Negative for flank pain.  Neurological:  Positive for headaches.  Psychiatric/Behavioral: Negative.     Vital Signs: BP Marland Kitchen)  142/79    Pulse 65    Resp 16    Ht '5\' 4"'  (1.626 m)    BMI 41.33 kg/m    Observation/Objective:  Pt is able to carry out conversation   Assessment/Plan: 1. Upper respiratory tract infection, unspecified type Will start on Levaquin and have her continue Mucinex and Flonase as well as use inhaler as needed - levofloxacin (LEVAQUIN) 500 MG tablet; Take 1 tablet (500 mg total) by mouth daily for 7 days.  Dispense: 7 tablet; Refill: 0   General Counseling: Cherina verbalizes understanding of the findings of today's phone visit and agrees with plan of treatment. I have discussed any further diagnostic evaluation that may be needed or ordered today. We also reviewed her medications today. she has been encouraged to call the office with any questions or concerns that should arise related to todays visit.    No orders of the defined types were placed in this encounter.   Meds ordered this encounter  Medications   levofloxacin (LEVAQUIN) 500 MG tablet    Sig: Take 1 tablet (500 mg total) by mouth daily for 7 days.    Dispense:  7 tablet    Refill:  0    Time spent:25 Minutes    Dr Lavera Guise Internal medicine

## 2021-03-12 ENCOUNTER — Other Ambulatory Visit: Payer: Self-pay | Admitting: Family

## 2021-03-12 DIAGNOSIS — I050 Rheumatic mitral stenosis: Secondary | ICD-10-CM

## 2021-03-12 DIAGNOSIS — I480 Paroxysmal atrial fibrillation: Secondary | ICD-10-CM

## 2021-03-12 DIAGNOSIS — I5032 Chronic diastolic (congestive) heart failure: Secondary | ICD-10-CM

## 2021-03-12 DIAGNOSIS — Z954 Presence of other heart-valve replacement: Secondary | ICD-10-CM

## 2021-03-18 ENCOUNTER — Ambulatory Visit (INDEPENDENT_AMBULATORY_CARE_PROVIDER_SITE_OTHER): Payer: 59

## 2021-03-18 ENCOUNTER — Other Ambulatory Visit: Payer: Self-pay

## 2021-03-18 ENCOUNTER — Ambulatory Visit
Admission: RE | Admit: 2021-03-18 | Discharge: 2021-03-18 | Disposition: A | Discharge status: Home / Self Care | Payer: Self-pay | Source: Ambulatory Visit | Attending: Orthopedic Surgery | Admitting: Orthopedic Surgery

## 2021-03-18 DIAGNOSIS — Z5181 Encounter for therapeutic drug level monitoring: Secondary | ICD-10-CM | POA: Diagnosis not present

## 2021-03-18 DIAGNOSIS — I050 Rheumatic mitral stenosis: Secondary | ICD-10-CM | POA: Diagnosis not present

## 2021-03-18 DIAGNOSIS — I4891 Unspecified atrial fibrillation: Secondary | ICD-10-CM

## 2021-03-18 DIAGNOSIS — M25522 Pain in left elbow: Secondary | ICD-10-CM | POA: Insufficient documentation

## 2021-03-18 LAB — POCT INR: INR: 1.9 — AB (ref 2.0–3.0)

## 2021-03-18 NOTE — Patient Instructions (Signed)
-   take 1.5 tablets tonight, then  ?- START NEW dosage warfarin 1/2 tablet daily EXCEPT 1 whole tablet on MONDAYS & FRIDAYS ?- Recheck INR in 2 weeks ?

## 2021-03-25 ENCOUNTER — Encounter: Payer: Self-pay | Admitting: Nurse Practitioner

## 2021-03-25 ENCOUNTER — Telehealth (INDEPENDENT_AMBULATORY_CARE_PROVIDER_SITE_OTHER): Payer: Self-pay | Admitting: Nurse Practitioner

## 2021-03-25 DIAGNOSIS — J0110 Acute frontal sinusitis, unspecified: Secondary | ICD-10-CM

## 2021-03-25 DIAGNOSIS — B3731 Acute candidiasis of vulva and vagina: Secondary | ICD-10-CM | POA: Diagnosis not present

## 2021-03-25 DIAGNOSIS — R051 Acute cough: Secondary | ICD-10-CM | POA: Diagnosis not present

## 2021-03-25 MED ORDER — FLUCONAZOLE 150 MG PO TABS: 150.0000 mg | ORAL_TABLET | Freq: Once | ORAL | 0 refills | Status: DC

## 2021-03-25 MED ORDER — HYDROCOD POLI-CHLORPHE POLI ER 10-8 MG/5ML PO SUER: 5.0000 mL | Freq: Two times a day (BID) | ORAL | 0 refills | Status: DC | PRN

## 2021-03-25 MED ORDER — AMOXICILLIN-POT CLAVULANATE 875-125 MG PO TABS: 1.0000 | ORAL_TABLET | Freq: Two times a day (BID) | ORAL | 0 refills | Status: AC

## 2021-03-25 NOTE — Progress Notes (Cosign Needed)
[employer] [place], [place] [address]  Internal MEDICINE  Telephone Visit  Patient Name: Leslie [name]  [id number]  [id number]  Date of Service: 03/25/2021  I connected with the patient at 12:30 PM by telephone and verified the patients identity using two identifiers.   I discussed the limitations, risks, security and privacy concerns of performing an evaluation and management service by telephone and the availability of in person appointments. I also discussed with the patient that there may be a patient responsible charge related to the service.  The patient expressed understanding and agrees to proceed.    Chief Complaint  Patient presents with   Telephone Assessment    [phone]   Telephone Screen   Sore Throat   Cough    Tested negative for covid, but not recently   Nasal Congestion    HPI Leslie [name] presents for a telehealth virtual visit for symptoms of sinusitis.  She reports experiencing sore throat, cough, nasal congestion, runny nose, sinus pressure, headache, chills, and fatigue.  Patient reports that the drainage from her nose is yellow.  Patient denies being exposed to anyone with COVID and has not recently tested for COVID.  She was negative for COVID with the last time she had these types of symptoms.   Current Medication: Outpatient Encounter Medications as of 03/25/2021  Medication Sig   ALPRAZolam (XANAX) 0.5 MG tablet TAKE 1/2 TABLET BY MOUTH AT BEDTIME AS NEEDED FOR ANXIETY.   amLODipine (NORVASC) 10 MG tablet TAKE 1 TABLET(10 MG) BY MOUTH DAILY   amoxicillin-clavulanate (AUGMENTIN) 875-125 MG tablet Take 1 tablet by mouth 2 (two) times daily for 10 days. Take with food.   aspirin 81 MG EC tablet Take 81 mg by mouth daily.   atorvastatin (LIPITOR) 80 MG tablet TAKE 1 TABLET BY MOUTH EVERY DAY AT 6 PM   calcium carbonate (TUMS - DOSED IN MG ELEMENTAL CALCIUM) 500 MG chewable tablet Chew 500 mg by mouth daily as needed for indigestion or  heartburn.   chlorpheniramine-HYDROcodone (TUSSIONEX PENNKINETIC ER) 10-8 MG/5ML Take 5 mLs by mouth every 12 (twelve) hours as needed for cough.   FEROCON capsule TAKE ONE CAPSULE BY MOUTH TWICE DAILY AFTER MEALS   fluconazole (DIFLUCAN) 150 MG tablet Take 1 tablet (150 mg total) by mouth once for 1 dose. May take an additional dose after 3 days if still symptomatic.   omeprazole (PRILOSEC) 40 MG capsule Take 1 capsule (40 mg total) by mouth daily.   oxyCODONE-acetaminophen (PERCOCET) 10-325 MG tablet Take 1 tablet by mouth every 6 (six) hours as needed.   potassium chloride SA (KLOR-CON) 20 MEQ tablet TAKE 1 TABLET(20 MEQ) BY MOUTH TWICE DAILY   spironolactone (ALDACTONE) 25 MG tablet Take 0.5 tablets (12.5 mg total) by mouth daily.   torsemide (DEMADEX) 20 MG tablet TAKE 2 TABLETS(40 MG) BY MOUTH TWICE DAILY   warfarin (COUMADIN) 2 MG tablet Take 1 tablet (2 mg total) by mouth as directed. Take 1/2-1 tablet daily as directed by the anti-coag clnic.   metoprolol succinate (TOPROL-XL) 100 MG 24 hr tablet Take 1 tablet (100 mg total) by mouth 2 (two) times daily. Take with or immediately following a meal.   No facility-administered encounter medications on file as of 03/25/2021.    Surgical History: Past Surgical History:  Procedure Laterality Date   ABDOMINAL HYSTERECTOMY     total   ABDOMINAL HYSTERECTOMY     BREAST BIOPSY Bilateral 2012   BREAST BIOPSY Right 08-07-12  fibroadenomatous changes and columnar cells   BREAST BIOPSY  02/03/2015   stereo byrnett   BUBBLE STUDY  04/01/2020   Procedure: BUBBLE STUDY;  Surgeon: [name], MD;  Location: [facility];  Service: Cardiovascular;;   CARDIAC CATHETERIZATION     CHOLECYSTECTOMY N/A 02/27/2016   Procedure: LAPAROSCOPIC CHOLECYSTECTOMY;  Surgeon: Christene Lye, MD;  Location: [facility];  Service: General;  Laterality: N/A;   CLIPPING OF ATRIAL APPENDAGE  04/29/2020   Procedure: CLIPPING OF ATRIAL APPENDAGE USING ATRICURE   PRO2 CLIP SIZE 45MM;  Surgeon: Rexene Alberts, MD;  Location: [facility] OR;  Service: Open Heart Surgery;;   COLONOSCOPY WITH PROPOFOL N/A 09/26/2018   Procedure: COLONOSCOPY WITH PROPOFOL;  Surgeon: Lucilla Lame, MD;  Location: [facility] ENDOSCOPY;  Service: Endoscopy;  Laterality: N/A;   CYSTOSCOPY W/ RETROGRADES Right 08/18/2018   Procedure: CYSTOSCOPY WITH RETROGRADE PYELOGRAM;  Surgeon: Billey Co, MD;  Location: [facility];  Service: Urology;  Laterality: Right;   CYSTOSCOPY/URETEROSCOPY/HOLMIUM LASER/STENT PLACEMENT Right 08/18/2018   Procedure: CYSTOSCOPY/URETEROSCOPY/STENT PLACEMENT;  Surgeon: Billey Co, MD;  Location: [facility];  Service: Urology;  Laterality: Right;   DIAGNOSTIC LAPAROSCOPY     ESOPHAGOGASTRODUODENOSCOPY (EGD) WITH PROPOFOL N/A 09/26/2018   Procedure: ESOPHAGOGASTRODUODENOSCOPY (EGD) WITH PROPOFOL;  Surgeon: Lucilla Lame, MD;  Location: [facility] ENDOSCOPY;  Service: Endoscopy;  Laterality: N/A;   GIVENS CAPSULE STUDY N/A 11/03/2018   Procedure: GIVENS CAPSULE STUDY;  Surgeon: Lucilla Lame, MD;  Location: [facility] ENDOSCOPY;  Service: Endoscopy;  Laterality: N/A;   JOINT REPLACEMENT Left    knee   KNEE ARTHROPLASTY Right 04/09/2019   Procedure: COMPUTER ASSISTED TOTAL KNEE ARTHROPLASTY;  Surgeon: Dereck Leep, MD;  Location: [facility];  Service: Orthopedics;  Laterality: Right;   KNEE CLOSED REDUCTION Left 04/15/2015   Procedure: CLOSED MANIPULATION KNEE;  Surgeon: Thornton Park, MD;  Location: [facility];  Service: Orthopedics;  Laterality: Left;   KNEE SURGERY     MAZE N/A 04/29/2020   Procedure: MAZE;  Surgeon: Rexene Alberts, MD;  Location: [facility];  Service: Open Heart Surgery;  Laterality: N/A;   MITRAL VALVE REPLACEMENT N/A 04/29/2020   Procedure: MITRAL VALVE (MV) REPLACEMENT USING ON-X VALVE SIZE 27/29MM;  Surgeon: Rexene Alberts, MD;  Location: [facility];  Service: Open Heart Surgery;  Laterality: N/A;   MULTIPLE EXTRACTIONS WITH ALVEOLOPLASTY N/A 02/28/2020   Procedure:  MULTIPLE EXTRACTION WITH ALVEOLOPLASTY;  Surgeon: [name], DMD;  Location: [facility];  Service: Dentistry;  Laterality: N/A;   RIGHT/LEFT HEART CATH AND CORONARY ANGIOGRAPHY Bilateral 09/06/2019   Procedure: RIGHT/LEFT HEART CATH AND CORONARY ANGIOGRAPHY;  Surgeon: Minna Merritts, MD;  Location: [facility];  Service: Cardiovascular;  Laterality: Bilateral;   TEE WITHOUT CARDIOVERSION N/A 09/13/2017   Procedure: TRANSESOPHAGEAL ECHOCARDIOGRAM (TEE);  Surgeon: Nelva Bush, MD;  Location: [facility];  Service: Cardiovascular;  Laterality: N/A;   TEE WITHOUT CARDIOVERSION N/A 04/01/2020   Procedure: TRANSESOPHAGEAL ECHOCARDIOGRAM (TEE);  Surgeon: [name], MD;  Location: [facility] ENDOSCOPY;  Service: Cardiovascular;  Laterality: N/A;   TEE WITHOUT CARDIOVERSION N/A 04/29/2020   Procedure: TRANSESOPHAGEAL ECHOCARDIOGRAM (TEE);  Surgeon: Rexene Alberts, MD;  Location: [facility];  Service: Open Heart Surgery;  Laterality: N/A;   TOTAL KNEE ARTHROPLASTY Left 12/25/2014   Procedure: TOTAL KNEE ARTHROPLASTY;  Surgeon: Thornton Park, MD;  Location: [facility];  Service: Orthopedics;  Laterality: Left;   TUBAL LIGATION      Medical History: Past Medical History:  Diagnosis Date   (  HFpEF) heart failure with preserved ejection fraction ([place])    a. 08/2017 Echo: EF 55-60%.  Grade 2 diastolic dysfunction; b. 01/69 Echo: EF 50-55%, no rwma, Nl RV fxn, nl fxn'ing mech MV.   Allergy    Anemia    Anxiety    BRCA negative 03/22/2013   Bronchitis 02/19/2016   ON LEVAQUIN PO   Chest tightness    a. 08/2019 Cath: nl cors.   Cigarette smoker 09/11/2017   8-10 day   Dysrhythmia    GERD (gastroesophageal reflux disease)    History of kidney stones    Hyperlipidemia    Hypertension    Interstitial lung disease ([place])    a. CT 2013 b. 02/2018 CXR noted recurrent intersistial changes ILD vs chronic bronchitis   Moderate mitral stenosis    a.  08/2017 TEE: EF 60 to 65%.  Moderate mitral  stenosis.  Mean gradient 14 mmHg.  Valve area 2.59 cm by planimetry, 2.72 cm by pressure half-time.   PAF (paroxysmal atrial fibrillation) ([place])    a. 08/2017 s/p TEE/DCCV; b. CHA2DS2VASc = 2-->warfarin.   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 04/2020 s/p 27/29 mm Onyx mech mitral valve-->chronic coumadin; b. 05/2020 Echo: Nl fxn'ing mech MV.    Family History: Family History  Problem Relation Age of Onset   Cancer Mother 64       breast   Hypertension Mother    Cancer Maternal Aunt        breast   Cancer Maternal Grandmother        breast   Osteoarthritis Father    Hypertension Father     Social History   Socioeconomic History   Marital status: Married    Spouse name: Not on file   Number of children: Not on file   Years of education: Not on file   Highest education level: Not on file  Occupational History   Not on file  Tobacco Use   Smoking status: Former    Packs/day: 0.50    Years: 20.00    Pack years: 10.00    Types: Cigarettes    Quit date: 01/2016    Years since quitting: 5.1   Smokeless tobacco: Never  Vaping Use   Vaping Use: Never used  Substance and Sexual Activity   Alcohol use: Yes    Comment: rarely   Drug use: No   Sexual activity: Not on file  Other Topics Concern   Not on file  Social History Narrative   Not on file   Social Determinants of Health   Financial Resource Strain: Low Risk    Difficulty of Paying Living Expenses: Not very hard  Food Insecurity: No Food Insecurity   Worried About Running Out of Food in the Last Year: Never true   [place] in the Last Year: Never true  Transportation Needs: No Transportation Needs   Lack of Transportation (Medical): No   Lack of Transportation  (Non-Medical): No  Physical Activity: Not on file  Stress: Not on file  Social Connections: Not on file  Intimate Partner Violence: Not on file      Review of Systems  Constitutional:  Positive for chills and fatigue. Negative for fever.  HENT:  Positive for congestion, ear pain, postnasal drip,  rhinorrhea, sinus pressure, sinus pain, sneezing and sore throat.   Respiratory:  Positive for cough. Negative for chest tightness, shortness of breath and wheezing.   Cardiovascular: Negative.  Negative for chest pain and palpitations.  Gastrointestinal: Negative.  Negative for abdominal pain, constipation, diarrhea, nausea and vomiting.  Neurological:  Positive for headaches.   Vital Signs: There were no vitals taken for this visit.   Observation/Objective: She is alert and oriented and engages in conversation appropriately.  She does not appear to be in any acute distress over video call.    Assessment/Plan: 1. Acute non-recurrent frontal sinusitis Empiric antibiotic treatment prescribed - amoxicillin-clavulanate (AUGMENTIN) 875-125 MG tablet; Take 1 tablet by mouth 2 (two) times daily for 10 days. Take with food.  Dispense: 20 tablet; Refill: 0  2. Acute cough Medication prescribed for symptomatic relief of cough - chlorpheniramine-HYDROcodone (TUSSIONEX PENNKINETIC ER) 10-8 MG/5ML; Take 5 mLs by mouth every 12 (twelve) hours as needed for cough.  Dispense: 200 mL; Refill: 0  3. Vulvovaginal candidiasis Patient is prone to get yeast infections when she is taking an antibiotic, fluconazole prescribed as a prophylactic. - fluconazole (DIFLUCAN) 150 MG tablet; Take 1 tablet (150 mg total) by mouth once for 1 dose. May take an additional dose after 3 days if still symptomatic.  Dispense: 3 tablet; Refill: 0   General Counseling: Syenna verbalizes understanding of the findings of today's phone visit and agrees with plan of treatment. I have discussed any further diagnostic evaluation  that may be needed or ordered today. We also reviewed her medications today. she has been encouraged to call the office with any questions or concerns that should arise related to todays visit.  Return if symptoms worsen or fail to improve.   No orders of the defined types were placed in this encounter.   Meds ordered this encounter  Medications   amoxicillin-clavulanate (AUGMENTIN) 875-125 MG tablet    Sig: Take 1 tablet by mouth 2 (two) times daily for 10 days. Take with food.    Dispense:  20 tablet    Refill:  0   chlorpheniramine-HYDROcodone (TUSSIONEX PENNKINETIC ER) 10-8 MG/5ML    Sig: Take 5 mLs by mouth every 12 (twelve) hours as needed for cough.    Dispense:  200 mL    Refill:  0   fluconazole (DIFLUCAN) 150 MG tablet    Sig: Take 1 tablet (150 mg total) by mouth once for 1 dose. May take an additional dose after 3 days if still symptomatic.    Dispense:  3 tablet    Refill:  0    Time spent:10 Minutes Time spent with patient included reviewing progress notes, labs, imaging studies, and discussing plan for follow up.  [place] Controlled Substance Database was reviewed by me for overdose risk score (ORS) if appropriate.  This patient was seen by Jonetta Osgood, FNP-C in collaboration with Dr. Clayborn Bigness as a part of collaborative care agreement.  Rhyatt Muska R. Valetta Fuller, MSN, FNP-C Internal medicine

## 2021-03-30 NOTE — Progress Notes (Unsigned)
Date:  03/30/2021   ID:  Leslie [name], DOB November 19, 1965, MRN [id number]   PCP:  Lavera Guise, MD  Cardiologist:  Ida Rogue, MD  Electrophysiologist:  None   Evaluation Performed:  Follow-Up Visit   No chief complaint on file.   History of Present Illness:    Leslie [name] is a 56 y.o. female with  mitral valve stenosis morbid obesity,  Snores, possible OSA anxiety,  hyperlipidemia,  Hospital admission 09/11/2017 for  shortness of breath chest tightness in atrial fibrillation with RVR Had TEE  , Cardioversion Mild COPD, long hx of smoking Cardiac CTA Coronary calcium score of 10. Who presents for follow up of her paroxysmal atrial fibrillation, mitral valve stenosis, s/p mechanical MVR, PAF s/p maze,  Last seen in clinic by myself December 2021 Seen by one of our providers May 2022  Reports weight is up several pounds, slight increase in shortness of breath Review of weight over the past 2 months indicates weight 231 up to 239 Reports she has been compliant with torsemide 40 daily Was previously on torsemide 40 twice daily  Little bit of ankle swelling Fell in shower , cut foot, "huge gash", fifth toe left foot Did not seek medical attention, feels very sore   left upper extremity ulnar nerve surgery done on 8/15 by Dr. Gavin Pound complicated by postop bleeding  Dressing change in office on 8/19 showed bleeding so she was sent to ED for ongoing surgical site bleeding Hemoglobin dropped to 7.7 S/p 1 unit PRBC  heparin bridge while in the hospital  Underwent repeat surgery for hematoma in September 2022, off warfarin, had Lovenox bridge Reports still has some numbness in her hand, has not started PT yet  EKG personally reviewed by myself on todays visit Shows normal sinus rhythm rate 64 bpm no significant ST-T wave changes  Other past medical history reviewed transesophageal echocardiogram 04/01/2020-confirming rheumatic heart disease with severe  mitral stenosis.    04/29/2020 she underwent median sternotomy, mitral valve replacement, MAZE.  Coumadin was initiated due to mechanical MVR  Echo 05/2020 Close to normal cardiac function Prosthetic mitral valve working well  Echocardiogram August 2020  mitral valve Moderate thickening of the mitral valve leaflet. Moderate-severe mitral valve stenosis, with mean gradient of 16 mmHg and MVA by continuity equation of 1.0 cm^2.    Transesophageal echo August 2019 with moderate mitral valve stenosis mean gradient of 14 Mobility of the posterior leaflet was restricted.    summer 2020 developed right flank pain,   underwent right ureteroscopy, laser lithotripsy, and stent placement on 08/18/2018.  Had pyelonephritis post procedure  Creatinine trended up  to 4.1  acute kidney injury and sepsis in the setting of recent right ureteral stone status post lithotripsy and likely pyelonephritis.   atrial fibrillation December 2019 01/05/2018 awoke suddenly with tachypalpitations  rates in the 130s   EMS was called, noted to be in sinus rhythm with heart rates in the 80s to 90s bpm.     Cardiac CTA 1. Coronary calcium score of 10. This was 58 percentile for age and sex matched control.. Normal coronary origin with right dominance.Mild non-obstructive CAD.  Past Medical History:  Diagnosis Date   (HFpEF) heart failure with preserved ejection fraction ([place])    a. 08/2017 Echo: EF 55-60%.  Grade 2 diastolic dysfunction; b. 0/3009 Echo: EF 50-55%, no rwma, Nl RV fxn, nl fxn'ing mech MV.   Allergy    Anemia  Anxiety    BRCA negative 03/22/2013   Bronchitis 02/19/2016   ON LEVAQUIN PO   Chest tightness    a. 08/2019 Cath: nl cors.   Cigarette smoker 09/11/2017   8-10 day   Dysrhythmia    GERD (gastroesophageal reflux disease)    History of kidney stones    Hyperlipidemia    Hypertension    Interstitial lung disease ([place])    a. CT 2013 b. 02/2018 CXR noted recurrent intersistial changes ILD vs  chronic bronchitis   Moderate mitral stenosis    a.  08/2017 TEE: EF 60 to 65%.  Moderate mitral stenosis.  Mean gradient 14 mmHg.  Valve area 2.59 cm by planimetry, 2.72 cm by pressure half-time.   PAF (paroxysmal atrial fibrillation) ([place])    a. 08/2017 s/p TEE/DCCV; b. CHA2DS2VASc = 2-->warfarin.   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 04/2020 s/p 27/29 mm Onyx mech mitral valve-->chronic coumadin; b. 05/2020 Echo: Nl fxn'ing mech MV.   Past Surgical History:  Procedure Laterality Date   ABDOMINAL HYSTERECTOMY     total   ABDOMINAL HYSTERECTOMY     BREAST BIOPSY Bilateral 2012   BREAST BIOPSY Right 08-07-12   fibroadenomatous changes and columnar cells   BREAST BIOPSY  02/03/2015   stereo byrnett   BUBBLE STUDY  04/01/2020   Procedure: BUBBLE STUDY;  Surgeon: [name], MD;  Location: [place];  Service: Cardiovascular;;   CARDIAC CATHETERIZATION     CHOLECYSTECTOMY N/A 02/27/2016   Procedure: LAPAROSCOPIC CHOLECYSTECTOMY;  Surgeon: Christene Lye, MD;  Location: [facility] ORS;  Service: General;  Laterality: N/A;   CLIPPING OF ATRIAL APPENDAGE  04/29/2020   Procedure: CLIPPING OF ATRIAL APPENDAGE USING ATRICURE  PRO2 CLIP SIZE 45MM;  Surgeon: Rexene Alberts, MD;  Location[facility] OR;  Service: Open Heart Surgery;;   COLONOSCOPY WITH PROPOFOL N/A 09/26/2018   Procedure: COLONOSCOPY WITH PROPOFOL;  Surgeon: Lucilla Lame, MD;  Location[facility] ENDOSCOPY;  Service: Endoscopy;  Laterality: N/A;   CYSTOSCOPY W/ RETROGRADES Right 08/18/2018   Procedure: CYSTOSCOPY WITH RETROGRADE PYELOGRAM;  Surgeon: Billey Co, MD;  Location: [facility];  Service: Urology;  Laterality: Right;   CYSTOSCOPY/URETEROSCOPY/HOLMIUM LASER/STENT PLACEMENT Right 08/18/2018    Procedure: CYSTOSCOPY/URETEROSCOPY/STENT PLACEMENT;  Surgeon: Billey Co, MD;  Location: [facility];  Service: Urology;  Laterality: Right;   DIAGNOSTIC LAPAROSCOPY     ESOPHAGOGASTRODUODENOSCOPY (EGD) WITH PROPOFOL N/A 09/26/2018   Procedure: ESOPHAGOGASTRODUODENOSCOPY (EGD) WITH PROPOFOL;  Surgeon: Lucilla Lame, MD;  Location: [facility] ENDOSCOPY;  Service: Endoscopy;  Laterality: N/A;   GIVENS CAPSULE STUDY N/A 11/03/2018   Procedure: GIVENS CAPSULE STUDY;  Surgeon: Lucilla Lame, MD;  Location: [facility] ENDOSCOPY;  Service: Endoscopy;  Laterality: N/A;   JOINT REPLACEMENT Left    knee   KNEE ARTHROPLASTY Right 04/09/2019   Procedure: COMPUTER ASSISTED TOTAL KNEE ARTHROPLASTY;  Surgeon: Dereck Leep, MD;  Location: [facility];  Service: Orthopedics;  Laterality: Right;   KNEE CLOSED REDUCTION Left 04/15/2015   Procedure: CLOSED MANIPULATION KNEE;  Surgeon: Thornton Park, MD;  Location: [facility];  Service: Orthopedics;  Laterality: Left;   KNEE SURGERY     MAZE N/A 04/29/2020   Procedure: MAZE;  Surgeon: Rexene Alberts, MD;  Location: [place];  Service: Open Heart Surgery;  Laterality:  N/A;   MITRAL VALVE REPLACEMENT N/A 04/29/2020   Procedure: MITRAL VALVE (MV) REPLACEMENT USING ON-X VALVE SIZE 27/29MM;  Surgeon: Rexene Alberts, MD;  Location: [place];  Service: Open Heart Surgery;  Laterality: N/A;   MULTIPLE EXTRACTIONS WITH ALVEOLOPLASTY N/A 02/28/2020   Procedure: MULTIPLE EXTRACTION WITH ALVEOLOPLASTY;  Surgeon: [name], DMD;  Location: [place];  Service: Dentistry;  Laterality: N/A;   RIGHT/LEFT HEART CATH AND CORONARY ANGIOGRAPHY Bilateral 09/06/2019   Procedure: RIGHT/LEFT HEART CATH AND CORONARY ANGIOGRAPHY;  Surgeon: Minna Merritts, MD;  Location: [place] CV LAB;  Service: Cardiovascular;  Laterality: Bilateral;   TEE WITHOUT CARDIOVERSION N/A 09/13/2017   Procedure: TRANSESOPHAGEAL ECHOCARDIOGRAM (TEE);  Surgeon: Nelva Bush, MD;  Location: [facility];  Service:  Cardiovascular;  Laterality: N/A;   TEE WITHOUT CARDIOVERSION N/A 04/01/2020   Procedure: TRANSESOPHAGEAL ECHOCARDIOGRAM (TEE);  Surgeon: [name], MD;  Location: [facility] ENDOSCOPY;  Service: Cardiovascular;  Laterality: N/A;   TEE WITHOUT CARDIOVERSION N/A 04/29/2020   Procedure: TRANSESOPHAGEAL ECHOCARDIOGRAM (TEE);  Surgeon: Rexene Alberts, MD;  Location: [place];  Service: Open Heart Surgery;  Laterality: N/A;   TOTAL KNEE ARTHROPLASTY Left 12/25/2014   Procedure: TOTAL KNEE ARTHROPLASTY;  Surgeon: Thornton Park, MD;  Location: [facility];  Service: Orthopedics;  Laterality: Left;   TUBAL LIGATION       No outpatient medications have been marked as taking for the 03/31/21 encounter (Appointment) with Minna Merritts, MD.     Allergies:   Morphine, Oxycodone hcl, and Dilaudid [hydromorphone hcl]   Social History   Tobacco Use   Smoking status: Former    Packs/day: 0.50    Years: 20.00    Pack years: 10.00    Types: Cigarettes    Quit date: 01/2016    Years since quitting: 5.1   Smokeless tobacco: Never  Vaping Use   Vaping Use: Never used  Substance Use Topics   Alcohol use: Yes    Comment: rarely   Drug use: No     Family Hx: The patient's family history includes Cancer in her maternal aunt and maternal grandmother; Cancer (age of onset: 69) in her mother; Hypertension in her father and mother; Osteoarthritis in her father.  ROS:   Please see the history of present illness.    Review of Systems  Constitutional: Negative.   Respiratory: Negative.    Cardiovascular: Negative.   Gastrointestinal: Negative.   Musculoskeletal: Negative.   Neurological: Negative.   Psychiatric/Behavioral: Negative.    All other systems reviewed and are negative.  Labs/Other Tests and Data Reviewed:     Recent Labs: 06/02/2020: BNP 116.6 06/13/2020: TSH 0.222 09/06/2020: Magnesium 2.1 10/08/2020: ALT 15 10/22/2020: BUN 20; Creatinine, Ser 1.42; Hemoglobin 11.0; Platelets 369;  Potassium 3.8; Sodium 135   Recent Lipid Panel Lab Results  Component Value Date/Time   CHOL 204 (H) 03/07/2020 10:39 AM   TRIG 224 (H) 03/07/2020 10:39 AM   HDL 35 (L) 03/07/2020 10:39 AM   CHOLHDL 5.2 09/19/2018 09:32 AM   LDLCALC 129 (H) 03/07/2020 10:39 AM    Wt Readings from Last 3 Encounters:  02/16/21 240 lb 12.8 oz (109.2 kg)  11/07/20 239 lb 6 oz (108.6 kg)  10/22/20 234 lb (106.1 kg)     Objective:    Vital Signs:  There were no vitals taken for this visit.  Constitutional:  oriented to person, place, and time. No distress.  HENT:  Head: Grossly normal Eyes:  no discharge. No scleral icterus.  Neck: No JVD, no carotid bruits  Cardiovascular: Regular rate and rhythm, no murmurs appreciated Pulmonary/Chest: Clear to auscultation bilaterally, no wheezes or rails Abdominal: Soft.  no distension.  no tenderness.  Musculoskeletal: Normal range of motion Neurological:  normal muscle tone. Coordination normal. No atrophy Skin: Skin warm and dry Psychiatric: normal affect, pleasant  ASSESSMENT & PLAN:    Mitral valve stenosis With moderate pulmonary hypertension S/p replacement -Continue torsemide 40 daily, extra 40 as needed for recent weight gain leg swelling shortness of breath abdominal swelling Recommended fluid restriction  2.  Paroxysmal atrial fibrillation On anticoagulation, maintaining normal sinus rhythm Continue metoprolol succinate 100 twice daily  3.  Morbid obesity We have encouraged continued exercise, careful diet management in an effort to lose weight.  4. PAF Continue current dose of metoprolol succinate On warfarin, maintaining normal sinus rhythm  5.  Diastolic CHF On torsemide 40 daily, extra 40 in the afternoon for ankle swelling, abdominal distention  6.  Left toe wound Fell in the shower, "gash" her foot Betadine provided, sterile bandages, high concern for cellulitis especially given mechanical valve.  Recommended 5 days Keflex 5 mg  4 times daily With close monitoring for any further signs of infection  Will need amoxicillin prior to dental procedures, prescription provided   Total encounter time more than 25 minutes  Greater than 50% was spent in counseling and coordination of care with the patient   Signed, Ida Rogue, MD  03/30/2021 1:54 PM    [place]

## 2021-03-31 ENCOUNTER — Other Ambulatory Visit: Payer: Self-pay

## 2021-03-31 ENCOUNTER — Ambulatory Visit: Payer: 59 | Admitting: Cardiovascular Disease

## 2021-03-31 ENCOUNTER — Encounter: Payer: Self-pay | Admitting: Cardiovascular Disease

## 2021-03-31 VITALS — BP 137/67 | HR 58 | Ht 64.0 in | Wt 248.0 lb

## 2021-03-31 DIAGNOSIS — I10 Essential (primary) hypertension: Secondary | ICD-10-CM

## 2021-03-31 DIAGNOSIS — I480 Paroxysmal atrial fibrillation: Secondary | ICD-10-CM

## 2021-03-31 DIAGNOSIS — E782 Mixed hyperlipidemia: Secondary | ICD-10-CM

## 2021-03-31 DIAGNOSIS — I050 Rheumatic mitral stenosis: Secondary | ICD-10-CM

## 2021-03-31 DIAGNOSIS — I5032 Chronic diastolic (congestive) heart failure: Secondary | ICD-10-CM

## 2021-03-31 DIAGNOSIS — Z954 Presence of other heart-valve replacement: Secondary | ICD-10-CM

## 2021-03-31 DIAGNOSIS — I4891 Unspecified atrial fibrillation: Secondary | ICD-10-CM

## 2021-03-31 NOTE — Patient Instructions (Addendum)
Medication Instructions:  ?No changes ? ?If you need a refill on your cardiac medications before your next appointment, please call your pharmacy.  ? ?Lab work: ?No new labs needed ? ?Testing/Procedures: ?Your physician has requested that you have an echocardiogram for summer 2023. Echocardiography is a painless test that uses sound waves to create images of your heart. It provides your doctor with information about the size and shape of your heart and how well your heart?s chambers and valves are working. This procedure takes approximately one hour. There are no restrictions for this procedure. ? ?There is a possibility that an IV may need to be started during your test to inject an image enhancing agent. This is done to obtain more optimal pictures of your heart. Therefore we ask that you do at least drink some water prior to coming in to hydrate your veins.  ? ? ?Follow-Up: ?At [facility], you and your health needs are our priority.  As part of our continuing mission to provide you with exceptional heart care, we have created designated Provider Care Teams.  These Care Teams include your primary Cardiologist (physician) and Advanced Practice Providers (APPs -  Physician Assistants and Nurse Practitioners) who all work together to provide you with the care you need, when you need it. ? ?You will need a follow up appointment in 12 months ? ?Providers on your designated Care Team:   ?Murray Hodgkins, NP ?Christell Faith, PA-C ?Cadence Kathlen Mody, PA-C ? ?COVID-19 Vaccine Information can be found at: [email or web address].uk For questions related to vaccine distribution or appointments, please email [email or web address] or call [phone].  ? ?

## 2021-04-03 NOTE — Addendum Note (Signed)
Addended by: Raelene Bott, Carmello Cabiness L on: 04/03/2021 12:55 PM ? ? Modules accepted: Orders ? ?

## 2021-04-08 ENCOUNTER — Ambulatory Visit (INDEPENDENT_AMBULATORY_CARE_PROVIDER_SITE_OTHER): Payer: 59

## 2021-04-08 ENCOUNTER — Other Ambulatory Visit: Payer: Self-pay

## 2021-04-08 DIAGNOSIS — Z5181 Encounter for therapeutic drug level monitoring: Secondary | ICD-10-CM

## 2021-04-08 DIAGNOSIS — I4891 Unspecified atrial fibrillation: Secondary | ICD-10-CM | POA: Diagnosis not present

## 2021-04-08 DIAGNOSIS — I050 Rheumatic mitral stenosis: Secondary | ICD-10-CM | POA: Diagnosis not present

## 2021-04-08 LAB — POCT INR: INR: 2 (ref 2.0–3.0)

## 2021-04-08 NOTE — Patient Instructions (Signed)
-   START NEW dosage warfarin 1/2 tablet daily EXCEPT 1 whole tablet on MONDAYS, WEDNESDAYS & FRIDAYS ?- Recheck INR in 2 weeks ?

## 2021-04-15 ENCOUNTER — Other Ambulatory Visit: Payer: Self-pay | Admitting: *Deleted

## 2021-04-15 DIAGNOSIS — D649 Anemia, unspecified: Secondary | ICD-10-CM

## 2021-04-17 ENCOUNTER — Other Ambulatory Visit: Payer: Self-pay | Admitting: Cardiovascular Disease

## 2021-04-20 ENCOUNTER — Ambulatory Visit (INDEPENDENT_AMBULATORY_CARE_PROVIDER_SITE_OTHER): Payer: 59 | Admitting: Orthopedic Surgery

## 2021-04-20 DIAGNOSIS — S5402XA Injury of ulnar nerve at forearm level, left arm, initial encounter: Secondary | ICD-10-CM

## 2021-04-20 NOTE — Progress Notes (Signed)
? ?Office Visit Note ?  ?Patient: Leslie [name]           ?Date of Birth: 1965/10/16           ?MRN: [id number] ?Visit Date: 04/20/2021 ?             ?Requested by: Lavera Guise, MD ?[address] ?[place],  [place] [address] ?PCP: Lavera Guise, MD ? ? ?Assessment & Plan: ?Visit Diagnoses:  ?1. Injury of left ulnar nerve, unspecified injury location, initial encounter   ? ? ?Plan: We discussed that it is hard to say for sure what the initial insult was to the ulnar nerve.  This may have been positional during surgery or injury from her PICC line.  She does note that her ulnar nerve symptoms have improved since her initial cubital tunnel release in August.  She still has numbness in the middle, ring, and small fingers.  She still has very weak interosseous function.  She has limited hand function and is unable to make a complete fist instead forms an intrinsic minus claw hand type of posture with no flexion at MP joints.  Discussed that I like to refer her to [facility] for further evaluation of her ulnar nerve injury or possible plexopathy.  ? ? ?Follow-Up Instructions: No follow-ups on file.  ? ?Orders:  ?Orders Placed This Encounter  ?Procedures  ? Ambulatory referral to Orthopedic Surgery  ? ?No orders of the defined types were placed in this encounter. ? ? ? ? Procedures: ?No procedures performed ? ? ?Clinical Data: ?No additional findings. ? ? ?Subjective: ?Chief Complaint  ?Patient presents with  ? Left Elbow - Pain  ? ? ?This is a 56 year old right-hand-dominant finger female who presents with numbness and weakness in the left hand.  This all started approximately 1 year ago after she underwent open mitral valve replacement.  When she woke up in the ICU she noted that she had numbness in her hand and very limited function.  She attributed this to placement of a PICC line in this extremity.  She subsequently underwent left cubital tunnel release in August that was complicated by a postop hematoma that required repeat  decompression.  She presents today with continued numbness in the middle, ring, and small finger and hand weakness.  She thinks the numbness has improved since her index cubital tunnel release.  She still has limited active motion of her fingers and is unable to make a full fist.  She has limited abduction and abduction of her fingers.  She notes that she still dropping things and had some difficulty with her usual daily tasks.  She also describes some pain at the lateral epicondyle which radiates distally into the proximal forearm but this is much less of an issue for her. ? ? ?Review of Systems ? ? ?Objective: ?Vital Signs: There were no vitals taken for this visit. ? ?Physical Exam ?Constitutional:   ?   Appearance: Normal appearance.  ?Cardiovascular:  ?   Rate and Rhythm: Normal rate.  ?   Pulses: Normal pulses.  ?   Comments: Audible click of mechanical valve ?Pulmonary:  ?   Effort: Pulmonary effort is normal.  ?Skin: ?   General: Skin is warm and dry.  ?   Capillary Refill: Capillary refill takes less than 2 seconds.  ?Neurological:  ?   Mental Status: She is alert.  ? ? ?Left Hand Exam  ? ?Tenderness  ?The patient is experiencing no tenderness.  ? ?  Other  ?Erythema: absent ?Sensation: decreased ?Pulse: present ? ?Comments:  Hypertrophic appearing scar at extensile cubital tunnel incision. Positive Tinel at elbow over cubital tunnel incision.  When attempting to make a fist, will assume a claw hand or intrinsic minus type of posture.  Minimal active finger adduction/abduction w/ 3/5 FDI strength.  Normal sensation in median distribution.  Diminished sensation in ulnar distribution.  ? ? ? ? ?Specialty Comments:  ?No specialty comments available. ? ?Imaging: ?No results found. ? ? ?PMFS History: ?Patient Active Problem List  ? Diagnosis Date Noted  ? Bleeding 09/07/2020  ? Blood loss anemia 09/06/2020  ? S/P mitral valve replacement with Onyx bileaflet mechanical valve 04/29/2020  ? S/P Maze operation for  atrial fibrillation 04/29/2020  ? S/P MVR (mitral valve replacement) 04/29/2020  ? BMI 39.0-39.9,adult 09/30/2019  ? Total knee replacement status 04/09/2019  ? Right knee pain 11/27/2018  ? GERD (gastroesophageal reflux disease) 10/25/2018  ? CKD (chronic kidney disease), stage III ([place]) 10/25/2018  ? CHF (congestive heart failure) ([place]) 10/25/2018  ? Normocytic anemia 10/01/2018  ? Iron deficiency anemia secondary to blood loss (chronic)   ? Polyp of descending colon   ? (HFpEF) heart failure with preserved ejection fraction ([place])   ? Renal calculus, right 07/03/2018  ? Low back pain 06/27/2018  ? Pedal edema 06/27/2018  ? Abnormal renal function 02/16/2018  ? Acute recurrent pansinusitis 01/13/2018  ? Anxiety 01/13/2018  ? Osteoarthritis of knee 01/09/2018  ? Morbid obesity with body mass index (BMI) of 40.0 or higher ([place]) 11/13/2017  ? Major depressive disorder 09/22/2017  ? Mitral valve stenosis   ? Atrial fibrillation ([place]) 09/11/2017  ? Hypertension 06/06/2017  ? Tobacco dependency 06/06/2017  ? Acute upper respiratory infection 04/28/2017  ? Pain of left lower extremity 02/11/2016  ? S/P total knee replacement using cement 12/25/2014  ? Fibrocystic breast disease 03/23/2013  ? ?Past Medical History:  ?Diagnosis Date  ? (HFpEF) heart failure with preserved ejection fraction ([place])   ? a. 08/2017 Echo: EF 55-60%.  Grade 2 diastolic dysfunction; b. 0/1027 Echo: EF 50-55%, no rwma, Nl RV fxn, nl fxn'ing mech MV.  ? Allergy   ? Anemia   ? Anxiety   ? BRCA negative 03/22/2013  ? Bronchitis 02/19/2016  ? ON LEVAQUIN PO  ? Chest tightness   ? a. 08/2019 Cath: nl cors.  ? Cigarette smoker 09/11/2017  ? 8-10 day  ? Dysrhythmia   ? GERD (gastroesophageal reflux disease)   ? History of kidney stones   ? Hyperlipidemia   ? Hypertension   ? Interstitial lung disease ([place])   ? a. CT 2013 b. 02/2018 CXR noted recurrent intersistial changes ILD vs chronic bronchitis  ? Moderate mitral stenosis   ? a.  08/2017 TEE: EF 60 to 65%.   Moderate mitral stenosis.  Mean gradient 14 mmHg.  Valve area 2.59 cm? by planimetry, 2.72 cm? by pressure half-time.  ? PAF (paroxysmal atrial fibrillation) ([place])   ? a. 08/2017 s/p TEE/DCCV; b. CHA2DS2VASc = 2-->warfarin.  ? Pneumonia   ? S/P Maze operation for atrial fibrillation 04/29/2020  ? Complete bilateral atrial lesion set using bipolar radiofrequency and cryothermy ablation with clipping of LA appendage  ? S/P mitral valve replacement with Onyx bileaflet mechanical valve 04/29/2020  ? a. 04/2020 s/p 27/29 mm Onyx mech mitral valve-->chronic coumadin; b. 05/2020 Echo: Nl fxn'ing mech MV.  ?  ?Family History  ?Problem Relation Age of Onset  ? Cancer Mother 65  ?  breast  ? Hypertension Mother   ? Cancer Maternal Aunt   ?     breast  ? Cancer Maternal Grandmother   ?     breast  ? Osteoarthritis Father   ? Hypertension Father   ?  ?Past Surgical History:  ?Procedure Laterality Date  ? ABDOMINAL HYSTERECTOMY    ? total  ? ABDOMINAL HYSTERECTOMY    ? BREAST BIOPSY Bilateral 2012  ? BREAST BIOPSY Right 08-07-12  ? fibroadenomatous changes and columnar cells  ? BREAST BIOPSY  02/03/2015  ? stereo byrnett  ? BUBBLE STUDY  04/01/2020  ? Procedure: BUBBLE STUDY;  Surgeon: [name], MD;  Location: [place];  Service: Cardiovascular;;  ? CARDIAC CATHETERIZATION    ? CHOLECYSTECTOMY N/A 02/27/2016  ? Procedure: LAPAROSCOPIC CHOLECYSTECTOMY;  Surgeon: Christene Lye, MD;  Location: [facility] ORS;  Service: General;  Laterality: N/A;  ? CLIPPING OF ATRIAL APPENDAGE  04/29/2020  ? Procedure: CLIPPING OF ATRIAL APPENDAGE USING ATRICURE  PRO2 CLIP SIZE 45MM;  Surgeon: Rexene Alberts, MD;  Location: [facility] OR;  Service: Open Heart Surgery;;  ? COLONOSCOPY WITH PROPOFOL N/A 09/26/2018  ? Procedure: COLONOSCOPY WITH PROPOFOL;  Surgeon: Lucilla Lame, MD;  Location: [facility] ENDOSCOPY;  Service: Endoscopy;  Laterality: N/A;  ? CYSTOSCOPY W/ RETROGRADES Right 08/18/2018  ? Procedure: CYSTOSCOPY WITH RETROGRADE PYELOGRAM;   Surgeon: Billey Co, MD;  Location: [facility] OR[facility];  Service: Urology;  Laterality: Right;  ? CYSTOSCOPY/URETEROSCOPY/HOLMIUM LASER/STENT PLACEMENT Right 08/18/2018  ? Procedure: CYSTOSCOPY/URETEROSCOPY/STEN

## 2021-04-21 ENCOUNTER — Inpatient Hospital Stay (HOSPITAL_BASED_OUTPATIENT_CLINIC_OR_DEPARTMENT_OTHER): Payer: 59 | Admitting: Nurse Practitioner

## 2021-04-21 ENCOUNTER — Inpatient Hospital Stay: Payer: 59 | Attending: Nurse Practitioner

## 2021-04-21 ENCOUNTER — Inpatient Hospital Stay: Payer: 59

## 2021-04-22 ENCOUNTER — Other Ambulatory Visit: Payer: 59

## 2021-04-22 ENCOUNTER — Ambulatory Visit (INDEPENDENT_AMBULATORY_CARE_PROVIDER_SITE_OTHER): Payer: 59

## 2021-04-22 ENCOUNTER — Ambulatory Visit: Payer: 59 | Admitting: Internal Medicine

## 2021-04-22 ENCOUNTER — Encounter: Payer: Self-pay | Admitting: Internal Medicine

## 2021-04-22 ENCOUNTER — Ambulatory Visit: Payer: 59

## 2021-04-22 DIAGNOSIS — I050 Rheumatic mitral stenosis: Secondary | ICD-10-CM | POA: Diagnosis not present

## 2021-04-22 DIAGNOSIS — I4891 Unspecified atrial fibrillation: Secondary | ICD-10-CM

## 2021-04-22 DIAGNOSIS — Z5181 Encounter for therapeutic drug level monitoring: Secondary | ICD-10-CM | POA: Diagnosis not present

## 2021-04-22 LAB — POCT INR: INR: 2.1 (ref 2.0–3.0)

## 2021-04-22 NOTE — Patient Instructions (Addendum)
-   take 1 tablet today, then ?- START NEW dosage warfarin 1 whole tablet daily EXCEPT 1/2 tablet on MONDAYS, WEDNESDAYS & FRIDAYS ?- Recheck INR in 1 week ?

## 2021-04-22 NOTE — Progress Notes (Signed)
No show

## 2021-04-29 ENCOUNTER — Ambulatory Visit (INDEPENDENT_AMBULATORY_CARE_PROVIDER_SITE_OTHER): Payer: 59

## 2021-04-29 DIAGNOSIS — Z5181 Encounter for therapeutic drug level monitoring: Secondary | ICD-10-CM | POA: Diagnosis not present

## 2021-04-29 DIAGNOSIS — I4891 Unspecified atrial fibrillation: Secondary | ICD-10-CM

## 2021-04-29 DIAGNOSIS — I050 Rheumatic mitral stenosis: Secondary | ICD-10-CM

## 2021-04-29 LAB — POCT INR: INR: 2.7 (ref 2.0–3.0)

## 2021-04-29 NOTE — Patient Instructions (Signed)
-   continue dosage warfarin 1 whole tablet daily EXCEPT 1/2 tablet on MONDAYS, WEDNESDAYS & FRIDAYS ?- Recheck INR in 2 weeks ?

## 2021-04-30 ENCOUNTER — Other Ambulatory Visit: Payer: Self-pay | Admitting: Internal Medicine

## 2021-04-30 ENCOUNTER — Other Ambulatory Visit: Payer: Self-pay

## 2021-04-30 DIAGNOSIS — Z1231 Encounter for screening mammogram for malignant neoplasm of breast: Secondary | ICD-10-CM

## 2021-05-07 ENCOUNTER — Other Ambulatory Visit: Payer: Self-pay | Admitting: Nurse Practitioner

## 2021-05-07 DIAGNOSIS — B3731 Acute candidiasis of vulva and vagina: Secondary | ICD-10-CM

## 2021-05-08 ENCOUNTER — Telehealth: Payer: Self-pay | Admitting: Radiology

## 2021-05-08 NOTE — Telephone Encounter (Signed)
Can you please call patient and have her come back in with dr. Tempie Donning since [facility] does not take her insurance  Thank you ?

## 2021-05-13 ENCOUNTER — Ambulatory Visit (INDEPENDENT_AMBULATORY_CARE_PROVIDER_SITE_OTHER): Payer: 59

## 2021-05-13 DIAGNOSIS — I050 Rheumatic mitral stenosis: Secondary | ICD-10-CM | POA: Diagnosis not present

## 2021-05-13 DIAGNOSIS — Z5181 Encounter for therapeutic drug level monitoring: Secondary | ICD-10-CM

## 2021-05-13 DIAGNOSIS — I4891 Unspecified atrial fibrillation: Secondary | ICD-10-CM | POA: Diagnosis not present

## 2021-05-13 LAB — POCT INR: INR: 3.5 — AB (ref 2.0–3.0)

## 2021-05-13 NOTE — Telephone Encounter (Signed)
Called patient back to schedule apt with Dr. Tempie Donning  ?

## 2021-05-13 NOTE — Patient Instructions (Signed)
-   continue dosage warfarin 1 whole tablet daily EXCEPT 1/2 tablet on MONDAYS, WEDNESDAYS & FRIDAYS ?- Recheck INR in 4 weeks ?

## 2021-05-18 ENCOUNTER — Other Ambulatory Visit: Payer: Self-pay | Admitting: Cardiovascular Disease

## 2021-05-18 DIAGNOSIS — I050 Rheumatic mitral stenosis: Secondary | ICD-10-CM

## 2021-05-18 DIAGNOSIS — Z954 Presence of other heart-valve replacement: Secondary | ICD-10-CM

## 2021-05-18 DIAGNOSIS — I5032 Chronic diastolic (congestive) heart failure: Secondary | ICD-10-CM

## 2021-05-30 ENCOUNTER — Other Ambulatory Visit: Payer: Self-pay | Admitting: Family

## 2021-05-30 DIAGNOSIS — E785 Hyperlipidemia, unspecified: Secondary | ICD-10-CM

## 2021-06-04 ENCOUNTER — Ambulatory Visit
Admission: RE | Admit: 2021-06-04 | Discharge: 2021-06-04 | Disposition: A | Discharge status: Home / Self Care | Payer: 59 | Source: Ambulatory Visit | Attending: Internal Medicine | Admitting: Internal Medicine

## 2021-06-04 DIAGNOSIS — Z1231 Encounter for screening mammogram for malignant neoplasm of breast: Secondary | ICD-10-CM | POA: Insufficient documentation

## 2021-06-08 ENCOUNTER — Other Ambulatory Visit: Payer: Self-pay | Admitting: Nurse Practitioner

## 2021-06-08 DIAGNOSIS — B3731 Acute candidiasis of vulva and vagina: Secondary | ICD-10-CM

## 2021-06-10 ENCOUNTER — Ambulatory Visit
Admission: RE | Admit: 2021-06-10 | Discharge: 2021-06-10 | Disposition: A | Discharge status: Home / Self Care | Payer: Self-pay | Source: Ambulatory Visit | Attending: *Deleted | Admitting: *Deleted

## 2021-06-10 ENCOUNTER — Ambulatory Visit (INDEPENDENT_AMBULATORY_CARE_PROVIDER_SITE_OTHER): Payer: 59

## 2021-06-10 ENCOUNTER — Other Ambulatory Visit: Payer: Self-pay | Admitting: *Deleted

## 2021-06-10 DIAGNOSIS — Z1231 Encounter for screening mammogram for malignant neoplasm of breast: Secondary | ICD-10-CM

## 2021-06-10 DIAGNOSIS — Z5181 Encounter for therapeutic drug level monitoring: Secondary | ICD-10-CM | POA: Diagnosis not present

## 2021-06-10 DIAGNOSIS — I4891 Unspecified atrial fibrillation: Secondary | ICD-10-CM

## 2021-06-10 DIAGNOSIS — I050 Rheumatic mitral stenosis: Secondary | ICD-10-CM

## 2021-06-10 LAB — POCT INR: INR: 3 (ref 2.0–3.0)

## 2021-06-10 NOTE — Patient Instructions (Signed)
-   continue dosage warfarin 1 whole tablet daily EXCEPT 1/2 tablet on MONDAYS, WEDNESDAYS & FRIDAYS - Recheck INR in 6 weeks

## 2021-06-13 ENCOUNTER — Other Ambulatory Visit: Payer: Self-pay

## 2021-06-13 ENCOUNTER — Emergency Department
Admission: EM | Admit: 2021-06-13 | Discharge: 2021-06-13 | Disposition: A | Discharge status: Home / Self Care | Payer: 59 | Attending: Emergency Medicine | Admitting: Emergency Medicine

## 2021-06-13 ENCOUNTER — Encounter: Payer: Self-pay | Admitting: Emergency Medicine

## 2021-06-13 ENCOUNTER — Emergency Department: Payer: 59

## 2021-06-13 DIAGNOSIS — Z7901 Long term (current) use of anticoagulants: Secondary | ICD-10-CM | POA: Insufficient documentation

## 2021-06-13 DIAGNOSIS — R110 Nausea: Secondary | ICD-10-CM

## 2021-06-13 DIAGNOSIS — R1012 Left upper quadrant pain: Secondary | ICD-10-CM | POA: Diagnosis present

## 2021-06-13 DIAGNOSIS — I110 Hypertensive heart disease with heart failure: Secondary | ICD-10-CM | POA: Diagnosis not present

## 2021-06-13 DIAGNOSIS — I509 Heart failure, unspecified: Secondary | ICD-10-CM | POA: Insufficient documentation

## 2021-06-13 DIAGNOSIS — K529 Noninfective gastroenteritis and colitis, unspecified: Secondary | ICD-10-CM | POA: Insufficient documentation

## 2021-06-13 LAB — CBC
HCT: 39.5 % (ref 36.0–46.0)
Hemoglobin: 12.6 g/dL (ref 12.0–15.0)
MCH: 28.6 pg (ref 26.0–34.0)
MCHC: 31.9 g/dL (ref 30.0–36.0)
MCV: 89.8 fL (ref 80.0–100.0)
Platelets: 295 10*3/uL (ref 150–400)
RBC: 4.4 MIL/uL (ref 3.87–5.11)
RDW: 12.6 % (ref 11.5–15.5)
WBC: 7.2 10*3/uL (ref 4.0–10.5)
nRBC: 0 % (ref 0.0–0.2)

## 2021-06-13 LAB — COMPREHENSIVE METABOLIC PANEL
ALT: 38 U/L (ref 0–44)
AST: 37 U/L (ref 15–41)
Albumin: 3.8 g/dL (ref 3.5–5.0)
Alkaline Phosphatase: 127 U/L — ABNORMAL HIGH (ref 38–126)
Anion gap: 9 (ref 5–15)
BUN: 22 mg/dL — ABNORMAL HIGH (ref 6–20)
CO2: 27 mmol/L (ref 22–32)
Calcium: 8.9 mg/dL (ref 8.9–10.3)
Chloride: 101 mmol/L (ref 98–111)
Creatinine, Ser: 1.64 mg/dL — ABNORMAL HIGH (ref 0.44–1.00)
GFR, Estimated: 37 mL/min — ABNORMAL LOW (ref >60)
Glucose, Bld: 116 mg/dL — ABNORMAL HIGH (ref 70–99)
Potassium: 3.6 mmol/L (ref 3.5–5.1)
Sodium: 137 mmol/L (ref 135–145)
Total Bilirubin: 0.7 mg/dL (ref 0.3–1.2)
Total Protein: 8.6 g/dL — ABNORMAL HIGH (ref 6.5–8.1)

## 2021-06-13 LAB — TYPE AND SCREEN
ABO/RH(D): A POS
Antibody Screen: NEGATIVE

## 2021-06-13 LAB — PROTIME-INR
INR: 1.8 — ABNORMAL HIGH (ref 0.8–1.2)
Prothrombin Time: 21.1 seconds — ABNORMAL HIGH (ref 11.4–15.2)

## 2021-06-13 LAB — LIPASE, BLOOD: Lipase: 22 U/L (ref 11–51)

## 2021-06-13 MED ORDER — ONDANSETRON 4 MG PO TBDP
4.0000 mg | ORAL_TABLET | Freq: Once | ORAL | Status: AC
Start: 2021-06-13 — End: 2021-06-13
  Administered 2021-06-13: 4 mg via ORAL
  Filled 2021-06-13: qty 1

## 2021-06-13 MED ORDER — SODIUM CHLORIDE 0.9 % IV BOLUS
1000.0000 mL | Freq: Once | INTRAVENOUS | Status: DC
Start: 2021-06-13 — End: 2021-06-13

## 2021-06-13 MED ORDER — ONDANSETRON 4 MG PO TBDP: 4.0000 mg | ORAL_TABLET | Freq: Three times a day (TID) | ORAL | 0 refills | Status: DC | PRN

## 2021-06-13 MED ORDER — AMOXICILLIN-POT CLAVULANATE 500-125 MG PO TABS: 1.0000 | ORAL_TABLET | Freq: Two times a day (BID) | ORAL | 0 refills | Status: DC

## 2021-06-13 MED ORDER — ONDANSETRON HCL 4 MG/2ML IJ SOLN
4.0000 mg | Freq: Once | INTRAMUSCULAR | Status: DC
Start: 2021-06-13 — End: 2021-06-13
  Filled 2021-06-13: qty 2

## 2021-06-13 NOTE — ED Notes (Signed)
IV team nurse came to assist with IV start but pt is not in the room. ER staff to attempt IV start again.

## 2021-06-13 NOTE — ED Provider Notes (Signed)
[facility] Provider Note    Event Date/Time   First MD Initiated Contact with Patient 06/13/21 1136     (approximate)  History   Chief Complaint: Abdominal Pain and Diarrhea  HPI  Leslie [name] is a 56 y.o. female with a past medical history of CHF, anxiety, gastric reflux, hypertension, hyperlipidemia, mitral valve replacement on Coumadin who presents to the emergency department for abdominal discomfort and dark diarrhea.  According to the patient for the past 3 days she has been experiencing some vague abdominal discomfort mostly in the left upper quadrant she also states she has been noticing dark diarrhea as well.  Patient went to the pharmacy today for an antidiarrheal but the pharmacist also recommended she come to the emergency department for evaluation.  Patient is on Coumadin for a mitral valve replacement last INR check was 3 days ago at 3.0.  Patient states some nausea but denies any vomiting.  Physical Exam   Triage Vital Signs: ED Triage Vitals [06/13/21 1055]  Enc Vitals Group     BP (!) 147/73     Pulse Rate 66     Resp 18     Temp 98.9 F (37.2 C)     Temp Source Oral     SpO2 97 %     Weight 244 lb (110.7 kg)     Height '5\' 4"'$  (1.626 m)     Head Circumference      Peak Flow      Pain Score 6     Pain Loc      Pain Edu?      Excl. in [place]?     Most recent vital signs: Vitals:   06/13/21 1055  BP: (!) 147/73  Pulse: 66  Resp: 18  Temp: 98.9 F (37.2 C)  SpO2: 97%    General: Awake, no distress.  CV:  Good peripheral perfusion.  Regular rate and rhythm  Resp:  Normal effort.  Equal breath sounds bilaterally.  Abd:  No distention.  Soft, slight left upper quadrant tenderness no rebound or guarding.  Rectal examination shows moderate hemorrhoids, dark stool however only very slightly trace guaiac positive.    ED Results / Procedures / Treatments   EKG  EKG viewed and interpreted by myself shows a sinus rhythm at 68 bpm  with a narrow QRS, normal axis, normal intervals, no concerning ST changes.  RADIOLOGY  I have interpreted the CT images I do not see any obvious acute abnormality. Radiologist read the CT scan is likely colitis   MEDICATIONS ORDERED IN ED: Medications  ondansetron (ZOFRAN) injection 4 mg (has no administration in time range)  sodium chloride 0.9 % bolus 1,000 mL (has no administration in time range)     IMPRESSION / MDM / ASSESSMENT AND PLAN / ED COURSE  I reviewed the triage vital signs and the nursing notes.  Patient presents to the emergency department for abdominal discomfort and dark stool for the past several days.  Patient is on Coumadin for mitral valve replacement.  Overall the patient appears well, no distress.  She has very slight left upper quadrant tenderness on my exam otherwise benign abdomen.  Patient has dark stool on rectal examination however only very slightly trace positive on guaiac testing.  Patient is also been taking Pepto-Bismol for the diarrhea she states.  Reassuringly patient's hemoglobin is normal.  We will check chemistry, lipase.  We will obtain CT imaging the abdomen/pelvis given her abdominal discomfort and  diarrhea to rule out colitis, diverticulitis or other intra-abdominal pathology.  Patient agreeable to plan of care.  CT most consistent with colitis.  We will start the patient on Augmentin twice a day.  I spoke to the patient regarding Augmentin and her use of Coumadin and the need to have her Coumadin level rechecked on Tuesday or Wednesday.  Patient agreeable to plan of care.  Discussed return precautions.  We will also prescribe Zofran to be used if needed.  Patient will follow-up with her doctor.  FINAL CLINICAL IMPRESSION(S) / ED DIAGNOSES   Diarrhea Abdominal pain Colitis   Note:  This document was prepared using Dragon voice recognition software and may include unintentional dictation errors.   Harvest Dark, MD 06/13/21 1402

## 2021-06-13 NOTE — ED Notes (Signed)
Attempted IV w/out success, pt appears to be hard stick, will request [place] IV.

## 2021-06-13 NOTE — Progress Notes (Signed)
A Stat consult was placed to the IV Therapist for iv access; pt needing a 20ga for CT;  upon arrival to ED at 12:15, the admissions clerk was with the pt, then the Radiology tech took the pt to Radiology for the CT;  explained that pt did not yet have the IV for the CT;  pt was taken for the procedure.  Secure chat sent to RN;  will have RN attempt again, and if unable, place another consult to the IV Nurse.

## 2021-06-13 NOTE — ED Triage Notes (Signed)
Pt via POV from home. Pt c/o epigastric pain and dark diarrhea for the pat 3-4 days. States that she is on Warfarin and her last INR was 3.0 on Wednesday. Denies vomiting. Denies fever. Pt is A&OX4 and NAD

## 2021-06-13 NOTE — ED Notes (Signed)
IV attempted. Unsuccessful.

## 2021-06-13 NOTE — Discharge Instructions (Signed)
As we discussed please have your Coumadin level rechecked early this coming week.  Return to the emergency department for any bloody stool worsening abdominal pain fever or any other symptom personally concerning to yourself.

## 2021-06-13 NOTE — ED Notes (Signed)
IV nurse has come twice and both times pt was out of the room for imaging... IV nurse was unable to wait so pt still has no line and has already been stuck 5 times. She just received PO antiemetic and wants to wait to see what her CT result looks like and how she feels, and then she may decide she doesn't need IV medication. Provider notified.

## 2021-06-13 NOTE — Progress Notes (Signed)
Another consult was placed to the hospital's IV Nurse for iv access;  arrived in ED at 1325, and again, the pt was not in the room;  her husband stated "they just took her for a CT scan."  Explained to the pt's husband that if they still need the IV,  I can come back a 3rd time;  he replied "I'm not sure we're going to stay."

## 2021-06-18 ENCOUNTER — Encounter: Payer: Self-pay | Admitting: Physician Assistant

## 2021-06-18 ENCOUNTER — Ambulatory Visit (INDEPENDENT_AMBULATORY_CARE_PROVIDER_SITE_OTHER): Payer: Self-pay | Admitting: Physician Assistant

## 2021-06-18 VITALS — BP 140/70 | HR 67 | Temp 97.8°F | Resp 16 | Ht 64.0 in | Wt 244.0 lb

## 2021-06-18 DIAGNOSIS — R300 Dysuria: Secondary | ICD-10-CM

## 2021-06-18 DIAGNOSIS — I10 Essential (primary) hypertension: Secondary | ICD-10-CM

## 2021-06-18 DIAGNOSIS — Z01419 Encounter for gynecological examination (general) (routine) without abnormal findings: Secondary | ICD-10-CM

## 2021-06-18 DIAGNOSIS — Z0001 Encounter for general adult medical examination with abnormal findings: Secondary | ICD-10-CM | POA: Diagnosis not present

## 2021-06-18 DIAGNOSIS — Z9889 Other specified postprocedural states: Secondary | ICD-10-CM

## 2021-06-18 DIAGNOSIS — L910 Hypertrophic scar: Secondary | ICD-10-CM

## 2021-06-18 DIAGNOSIS — I5032 Chronic diastolic (congestive) heart failure: Secondary | ICD-10-CM

## 2021-06-18 DIAGNOSIS — N1832 Chronic kidney disease, stage 3b: Secondary | ICD-10-CM

## 2021-06-18 DIAGNOSIS — Z954 Presence of other heart-valve replacement: Secondary | ICD-10-CM

## 2021-06-18 DIAGNOSIS — R1084 Generalized abdominal pain: Secondary | ICD-10-CM

## 2021-06-18 DIAGNOSIS — K921 Melena: Secondary | ICD-10-CM

## 2021-06-18 DIAGNOSIS — Z8679 Personal history of other diseases of the circulatory system: Secondary | ICD-10-CM

## 2021-06-18 DIAGNOSIS — R7301 Impaired fasting glucose: Secondary | ICD-10-CM

## 2021-06-18 NOTE — Progress Notes (Signed)
[facility] [place], [place] [address]  Internal MEDICINE  Office Visit Note  Patient Name: Leslie [name]  [id number]  [id number]  Date of Service: 06/24/2021  Chief Complaint  Patient presents with   Follow-up   Annual Exam   Hypertension   Hyperlipidemia     HPI Pt is here for routine health maintenance examination -recently went to urgent care after 4 days of diarrhea with dark stools and abdominal pain, but then was sent to ED. Had a CT scan which showed diverticulosis without diverticulitis and inflammatory changes suggesting colitis. Did have rectal exam and states possible blood seen during this. She was started on augmentin for colitis. She is still having some abdominal discomfort today and requests GI referral -has not been back to cardiology recently, thinks next appt this month -She does continue to have frequent yeast infections and discussed cleaning techniques and avoiding fragrant body washes. Will also update A1c and have this added to her previous lab orders--advised to take both order sets to lab  -Also followed by nephrology -Mammogram just updated, colonoscopy done in 2020 -States her sternotomy scar is bothering her and was suggested by surgery to see dermatology for possible steroid injections  Current Medication: Outpatient Encounter Medications as of 06/18/2021  Medication Sig   ALPRAZolam (XANAX) 0.5 MG tablet TAKE 1/2 TABLET BY MOUTH AT BEDTIME AS NEEDED FOR ANXIETY.   amLODipine (NORVASC) 10 MG tablet TAKE 1 TABLET(10 MG) BY MOUTH DAILY   amoxicillin-clavulanate (AUGMENTIN) 500-125 MG tablet Take 1 tablet (500 mg total) by mouth 2 (two) times daily.   aspirin 81 MG EC tablet Take 81 mg by mouth daily.   atorvastatin (LIPITOR) 80 MG tablet TAKE 1 TABLET BY MOUTH EVERY DAY AT 6 PM   calcium carbonate (TUMS - DOSED IN MG ELEMENTAL CALCIUM) 500 MG chewable tablet Chew 500 mg by mouth daily as needed for indigestion or heartburn.    FEROCON capsule TAKE ONE CAPSULE BY MOUTH TWICE DAILY AFTER MEALS   fluconazole (DIFLUCAN) 150 MG tablet TAKE 1 TABLET(150 MG) BY MOUTH 1 TIME FOR 1 DOSE. MAY TAKE AN ADDITIONAL DOSE AFTER 3 DAYS IF STILL SYMPTOMATIC   omeprazole (PRILOSEC) 40 MG capsule Take 1 capsule (40 mg total) by mouth daily.   ondansetron (ZOFRAN-ODT) 4 MG disintegrating tablet Take 1 tablet (4 mg total) by mouth every 8 (eight) hours as needed for nausea or vomiting.   oxyCODONE-acetaminophen (PERCOCET) 10-325 MG tablet Take 1 tablet by mouth every 6 (six) hours as needed.   potassium chloride SA (KLOR-CON) 20 MEQ tablet TAKE 1 TABLET(20 MEQ) BY MOUTH TWICE DAILY   torsemide (DEMADEX) 20 MG tablet TAKE 2 TABLETS BY MOUTH TWICE DAILY   warfarin (COUMADIN) 2 MG tablet Take 1 tablet (2 mg total) by mouth as directed. Take 1/2-1 tablet daily as directed by the anti-coag clnic.   [DISCONTINUED] spironolactone (ALDACTONE) 25 MG tablet Take 0.5 tablets (12.5 mg total) by mouth daily.   metoprolol succinate (TOPROL-XL) 100 MG 24 hr tablet Take 1 tablet (100 mg total) by mouth 2 (two) times daily. Take with or immediately following a meal.   No facility-administered encounter medications on file as of 06/18/2021.    Surgical History: Past Surgical History:  Procedure Laterality Date   ABDOMINAL HYSTERECTOMY     total   ABDOMINAL HYSTERECTOMY     BREAST BIOPSY Bilateral 2012   BREAST BIOPSY Right 08-07-12   fibroadenomatous changes and columnar cells   BREAST BIOPSY  02/03/2015   stereo byrnett   BUBBLE STUDY  04/01/2020   Procedure: BUBBLE STUDY;  Surgeon: [name], MD;  Location: [place];  Service: Cardiovascular;;   CARDIAC CATHETERIZATION     CHOLECYSTECTOMY N/A 02/27/2016   Procedure: LAPAROSCOPIC CHOLECYSTECTOMY;  Surgeon: Christene Lye, MD;  Location: [facility];  Service: General;  Laterality: N/A;   CLIPPING OF ATRIAL APPENDAGE  04/29/2020   Procedure: CLIPPING OF ATRIAL APPENDAGE USING ATRICURE   PRO2 CLIP SIZE 45MM;  Surgeon: Rexene Alberts, MD;  Location: [facility] OR;  Service: Open Heart Surgery;;   COLONOSCOPY WITH PROPOFOL N/A 09/26/2018   Procedure: COLONOSCOPY WITH PROPOFOL;  Surgeon: Lucilla Lame, MD;  Location: [facility] ENDOSCOPY;  Service: Endoscopy;  Laterality: N/A;   CYSTOSCOPY W/ RETROGRADES Right 08/18/2018   Procedure: CYSTOSCOPY WITH RETROGRADE PYELOGRAM;  Surgeon: Billey Co, MD;  Location: [facility];  Service: Urology;  Laterality: Right;   CYSTOSCOPY/URETEROSCOPY/HOLMIUM LASER/STENT PLACEMENT Right 08/18/2018   Procedure: CYSTOSCOPY/URETEROSCOPY/STENT PLACEMENT;  Surgeon: Billey Co, MD;  Location: [facility];  Service: Urology;  Laterality: Right;   DIAGNOSTIC LAPAROSCOPY     ESOPHAGOGASTRODUODENOSCOPY (EGD) WITH PROPOFOL N/A 09/26/2018   Procedure: ESOPHAGOGASTRODUODENOSCOPY (EGD) WITH PROPOFOL;  Surgeon: Lucilla Lame, MD;  Location: [facility] ENDOSCOPY;  Service: Endoscopy;  Laterality: N/A;   GIVENS CAPSULE STUDY N/A 11/03/2018   Procedure: GIVENS CAPSULE STUDY;  Surgeon: Lucilla Lame, MD;  Location: [facility] ENDOSCOPY;  Service: Endoscopy;  Laterality: N/A;   JOINT REPLACEMENT Left    knee   KNEE ARTHROPLASTY Right 04/09/2019   Procedure: COMPUTER ASSISTED TOTAL KNEE ARTHROPLASTY;  Surgeon: Dereck Leep, MD;  Location: [facility];  Service: Orthopedics;  Laterality: Right;   KNEE CLOSED REDUCTION Left 04/15/2015   Procedure: CLOSED MANIPULATION KNEE;  Surgeon: Thornton Park, MD;  Location: [facility];  Service: Orthopedics;  Laterality: Left;   KNEE SURGERY     MAZE N/A 04/29/2020   Procedure: MAZE;  Surgeon: Rexene Alberts, MD;  Location: [place];  Service: Open Heart Surgery;  Laterality: N/A;   MITRAL VALVE REPLACEMENT N/A 04/29/2020   Procedure: MITRAL VALVE (MV) REPLACEMENT USING ON-X VALVE SIZE 27/29MM;  Surgeon: Rexene Alberts, MD;  Location: [place];  Service: Open Heart Surgery;  Laterality: N/A;   MULTIPLE EXTRACTIONS WITH ALVEOLOPLASTY N/A 02/28/2020   Procedure:  MULTIPLE EXTRACTION WITH ALVEOLOPLASTY;  Surgeon: [name], DMD;  Location: [place];  Service: Dentistry;  Laterality: N/A;   RIGHT/LEFT HEART CATH AND CORONARY ANGIOGRAPHY Bilateral 09/06/2019   Procedure: RIGHT/LEFT HEART CATH AND CORONARY ANGIOGRAPHY;  Surgeon: Minna Merritts, MD;  Location: [place] [facility];  Service: Cardiovascular;  Laterality: Bilateral;   TEE WITHOUT CARDIOVERSION N/A 09/13/2017   Procedure: TRANSESOPHAGEAL ECHOCARDIOGRAM (TEE);  Surgeon: Nelva Bush, MD;  Location: [facility];  Service: Cardiovascular;  Laterality: N/A;   TEE WITHOUT CARDIOVERSION N/A 04/01/2020   Procedure: TRANSESOPHAGEAL ECHOCARDIOGRAM (TEE);  Surgeon: [name], MD;  Location: [facility] END[facility]COPY;  Service: Cardiovascular;  Laterality: N/A;   TEE WITHOUT CARDIOVERSION N/A 04/29/2020   Procedure: TRANSESOPHAGEAL ECHOCARDIOGRAM (TEE);  Surgeon: Rexene Alberts, MD;  Location: [place];  Service: Open Heart Surgery;  Laterality: N/A;   TOTAL KNEE ARTHROPLASTY Left 12/25/2014   Procedure: TOTAL KNEE ARTHROPLASTY;  Surgeon: Thornton Park, MD;  Location: [facility];  Service: Orthopedics;  Laterality: Left;   TUBAL LIGATION      Medical History: Past Medical History:  Diagnosis Date   (HFpEF) heart failure with preserved ejection fraction ([place])  a. 08/2017 Echo: EF 55-60%.  Grade 2 diastolic dysfunction; b. 03/8754 Echo: EF 50-55%, no rwma, Nl RV fxn, nl fxn'ing mech MV.   Allergy    Anemia    Anxiety    BRCA negative 03/22/2013   Bronchitis 02/19/2016   ON LEVAQUIN PO   Chest tightness    a. 08/2019 Cath: nl cors.   Cigarette smoker 09/11/2017   8-10 day   Dysrhythmia    GERD (gastroesophageal reflux disease)    History of kidney stones    Hyperlipidemia    Hypertension    Interstitial lung disease ([place])    a. CT 2013 b. 02/2018 CXR noted recurrent intersistial changes ILD vs chronic bronchitis   Moderate mitral stenosis    a.  08/2017 TEE: EF 60 to 65%.  Moderate mitral  stenosis.  Mean gradient 14 mmHg.  Valve area 2.59 cm by planimetry, 2.72 cm by pressure half-time.   PAF (paroxysmal atrial fibrillation) ([place])    a. 08/2017 s/p TEE/DCCV; b. CHA2DS2VASc = 2-->warfarin.   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 04/2020 s/p 27/29 mm Onyx mech mitral valve-->chronic coumadin; b. 05/2020 Echo: Nl fxn'ing mech MV.    Family History: Family History  Problem Relation Age of Onset   Cancer Mother 87       breast   Hypertension Mother    Cancer Maternal Aunt        breast   Cancer Maternal Grandmother        breast   Osteoarthritis Father    Hypertension Father       Review of Systems  Constitutional:  Negative for chills, fatigue and unexpected weight change.  HENT:  Negative for congestion, postnasal drip, rhinorrhea, sneezing and sore throat.   Eyes:  Negative for redness.  Respiratory:  Negative for cough, chest tightness and shortness of breath.   Cardiovascular:  Negative for chest pain and palpitations.  Gastrointestinal:  Positive for abdominal pain, blood in stool and diarrhea. Negative for constipation, nausea and vomiting.  Genitourinary:  Negative for dysuria and frequency.  Musculoskeletal:  Negative for arthralgias, back pain, joint swelling and neck pain.  Skin:  Negative for rash.       Keloid on chest from cardiac surgery bothersome  Neurological: Negative.  Negative for tremors and numbness.  Hematological:  Negative for adenopathy. Does not bruise/bleed easily.  Psychiatric/Behavioral:  Negative for behavioral problems (Depression), sleep disturbance and suicidal ideas. The patient is not nervous/anxious.     Vital Signs: BP 140/70   Pulse 67   Temp 97.8 F (36.6 C)   Resp 16   Ht _0  (1.626 m)    Wt 244 lb (110.7 kg)   SpO2 98%   BMI 41.88 kg/m    Physical Exam Vitals and nursing note reviewed.  Constitutional:      General: She is not in acute distress.    Appearance: Normal appearance. She is well-developed. She is obese. She is not diaphoretic.  HENT:     Head: Normocephalic and atraumatic.     Mouth/Throat:     Pharynx: No oropharyngeal exudate.  Eyes:     Pupils: Pupils are equal, round, and reactive to light.  Neck:     Thyroid: No thyromegaly.     Vascular: No  JVD.     Trachea: No tracheal deviation.  Cardiovascular:     Rate and Rhythm: Normal rate and regular rhythm.     Heart sounds: Normal heart sounds. No murmur heard.   No friction rub. No gallop.  Pulmonary:     Effort: Pulmonary effort is normal. No respiratory distress.     Breath sounds: No wheezing or rales.  Chest:     Chest wall: No tenderness.  Breasts:    Right: Normal. No mass.     Left: Normal. No mass.  Abdominal:     General: Bowel sounds are normal.     Palpations: Abdomen is soft.     Tenderness: There is abdominal tenderness. There is no right CVA tenderness, left CVA tenderness, guarding or rebound.  Musculoskeletal:        General: Normal range of motion.     Cervical back: Normal range of motion and neck supple.  Lymphadenopathy:     Cervical: No cervical adenopathy.  Skin:    General: Skin is warm and dry.     Comments: Sternal Keloid scar  Neurological:     Mental Status: She is alert and oriented to person, place, and time.     Cranial Nerves: No cranial nerve deficit.  Psychiatric:        Behavior: Behavior normal.        Thought Content: Thought content normal.        Judgment: Judgment normal.     LABS: Recent Results (from the past 2160 hour(s))  POCT INR     Status: None   Collection Time: 04/08/21  1:09 PM  Result Value Ref Range   INR 2.0 2.0 - 3.0  POCT INR     Status: None   Collection Time: 04/22/21 11:50 AM  Result Value Ref Range   INR 2.1 2.0 -  3.0  POCT INR     Status: None   Collection Time: 04/29/21 11:47 AM  Result Value Ref Range   INR 2.7 2.0 - 3.0  POCT INR     Status: Abnormal   Collection Time: 05/13/21 11:32 AM  Result Value Ref Range   INR 3.5 (A) 2.0 - 3.0  POCT INR     Status: None   Collection Time: 06/10/21 11:45 AM  Result Value Ref Range   INR 3.0 2.0 - 3.0  Type and screen [place]     Status: None   Collection Time: 06/13/21 10:54 AM  Result Value Ref Range   ABO/RH(D) A POS    Antibody Screen NEG    Sample Expiration      06/16/2021,2359 Performed at [facility], [address] [place], [place] [address]   Lipase, blood     Status: None   Collection Time: 06/13/21 11:29 AM  Result Value Ref Range   Lipase 22 11 - 51 U/L    Comment: Performed at [facility], [address] [place], [place] [address]  Comprehensive metabolic panel     Status: Abnormal   Collection Time: 06/13/21 11:29 AM  Result Value Ref Range   Sodium 137 135 - 145 mmol/L   Potassium 3.6 3.5 - 5.1 mmol/L   Chloride 101 98 - 111 mmol/L   CO2 27 22 - 32 mmol/L   Glucose, Bld 116 (H) 70 - 99 mg/dL    Comment: Glucose reference range applies only to samples taken after fasting for at least 8 hours.   BUN 22 (H)  6 - 20 mg/dL   Creatinine, Ser 1.64 (H) 0.44 - 1.00 mg/dL   Calcium 8.9 8.9 - 10.3 mg/dL   Total Protein 8.6 (H) 6.5 - 8.1 g/dL   Albumin 3.8 3.5 - 5.0 g/dL   AST 37 15 - 41 U/L   ALT 38 0 - 44 U/L   Alkaline Phosphatase 127 (H) 38 - 126 U/L   Total Bilirubin 0.7 0.3 - 1.2 mg/dL   GFR, Estimated 37 (L) >60 mL/min    Comment: (NOTE) Calculated using the CKD-EPI Creatinine Equation (2021)    Anion gap 9 5 - 15    Comment: Performed at [facility], [address] [place], [place] [address]  CBC     Status: None   Collection Time: 06/13/21 11:29 AM  Result Value Ref Range   WBC 7.2 4.0 - 10.5 K/uL   RBC 4.40 3.87 - 5.11 MIL/uL   Hemoglobin 12.6 12.0 - 15.0  g/dL   HCT 39.5 36.0 - 46.0 %   MCV 89.8 80.0 - 100.0 fL   MCH 28.6 26.0 - 34.0 pg   MCHC 31.9 30.0 - 36.0 g/dL   RDW 12.6 11.5 - 15.5 %   Platelets 295 150 - 400 K/uL   nRBC 0.0 0.0 - 0.2 %    Comment: Performed at [facility], [address] [place], [place] [address]  Protime-INR     Status: Abnormal   Collection Time: 06/13/21 11:29 AM  Result Value Ref Range   Prothrombin Time 21.1 (H) 11.4 - 15.2 seconds   INR 1.8 (H) 0.8 - 1.2    Comment: (NOTE) INR goal varies based on device and disease states. Performed at [facility], [address] [place], [place] [address]   Hgb A1C w/o eAG     Status: Abnormal   Collection Time: 06/18/21  4:10 PM  Result Value Ref Range   Hgb A1c MFr Bld 6.5 (H) 4.8 - 5.6 %    Comment:          Prediabetes: 5.7 - 6.4          Diabetes: >6.4          Glycemic control for adults with diabetes: <7.0   UA/M w/rflx Culture, Routine     Status: None   Collection Time: 06/18/21  4:26 PM   Specimen: Urine   Urine  Result Value Ref Range   Specific Gravity, UA 1.009 1.005 - 1.030   pH, UA 6.5 5.0 - 7.5   Color, UA Yellow Yellow   Appearance Ur Clear Clear   Leukocytes,UA Negative Negative   Protein,UA Negative Negative/Trace   Glucose, UA Negative Negative   Ketones, UA Negative Negative   RBC, UA Negative Negative   Bilirubin, UA Negative Negative   Urobilinogen, Ur 0.2 0.2 - 1.0 mg/dL   Nitrite, UA Negative Negative   Microscopic Examination Comment     Comment: Microscopic follows if indicated.   Microscopic Examination See below:     Comment: Microscopic was indicated and was performed.   Urinalysis Reflex Comment     Comment: This specimen will not reflex to a Urine Culture.  Microscopic Examination     Status: None   Collection Time: 06/18/21  4:26 PM   Urine  Result Value Ref Range   WBC, UA None seen 0 - 5 /hpf   RBC 0-2 0 - 2 /hpf   Epithelial Cells (non renal) 0-10 0 - 10 /hpf   Casts None  seen None seen  /lpf   Bacteria, UA None seen None seen/Few        Assessment/Plan: 1. Encounter for general adult medical examination with abnormal findings CPE performed, routine fasting labs previously ordered and will be reprinted for patient along with added A1c lab order today.  Up-to-date on mammogram and colonoscopy  2. Primary hypertension Stable, continue current medications  3. Postoperative keloid scar Will refer to dermatology - Ambulatory referral to Dermatology  4. Chronic heart failure with preserved ejection fraction ([place]) Followed by cardiology  5. S/P Maze operation for atrial fibrillation Followed by cardiology  6. S/P mitral valve replacement with Onyx bileaflet mechanical valve Followed by cardiology  7. Impaired fasting blood sugar We will recheck A1c and treat as indicated - Hgb A1C w/o eAG  8. Generalized abdominal pain Patient will continue Augmentin as prescribed by ED due to CT finding of colitis.  Due to continued dark tarry stools and abdominal pain patient will refer to GI for further follow-up - Ambulatory referral to Gastroenterology  9. Blood in stool - Ambulatory referral to Gastroenterology  10. Stage 3b chronic kidney disease ([place]) Followed by nephrology  11. Visit for gynecologic examination Breast exam performed  12. Dysuria - UA/M w/rflx Culture, Routine    General Counseling: Anisa verbalizes understanding of the findings of todays visit and agrees with plan of treatment. I have discussed any further diagnostic evaluation that may be needed or ordered today. We also reviewed her medications today. she has been encouraged to call the office with any questions or concerns that should arise related to todays visit.    Counseling:    Orders Placed This Encounter  Procedures   Microscopic Examination   UA/M w/rflx Culture, Routine   Hgb A1C w/o eAG   Ambulatory referral to Gastroenterology   Ambulatory referral to Dermatology     No orders of the defined types were placed in this encounter.   This patient was seen by Drema Dallas, PA-C in collaboration with Dr. Clayborn Bigness as a part of collaborative care agreement.  Total time spent:35 Minutes  Time spent includes review of chart, medications, test results, and follow up plan with the patient.     Lavera Guise, MD  Internal Medicine

## 2021-06-19 LAB — UA/M W/RFLX CULTURE, ROUTINE
Bilirubin, UA: NEGATIVE
Glucose, UA: NEGATIVE
Ketones, UA: NEGATIVE
Leukocytes,UA: NEGATIVE
Nitrite, UA: NEGATIVE
Protein,UA: NEGATIVE
RBC, UA: NEGATIVE
Specific Gravity, UA: 1.009 (ref 1.005–1.030)
Urobilinogen, Ur: 0.2 mg/dL (ref 0.2–1.0)
pH, UA: 6.5 (ref 5.0–7.5)

## 2021-06-19 LAB — MICROSCOPIC EXAMINATION
Bacteria, UA: NONE SEEN
Casts: NONE SEEN /lpf
WBC, UA: NONE SEEN /hpf (ref 0–5)

## 2021-06-19 LAB — HGB A1C W/O EAG: Hgb A1c MFr Bld: 6.5 % — ABNORMAL HIGH (ref 4.8–5.6)

## 2021-06-22 ENCOUNTER — Other Ambulatory Visit: Payer: Self-pay | Admitting: Nurse Practitioner

## 2021-06-22 DIAGNOSIS — I050 Rheumatic mitral stenosis: Secondary | ICD-10-CM

## 2021-06-22 DIAGNOSIS — Z954 Presence of other heart-valve replacement: Secondary | ICD-10-CM

## 2021-06-22 DIAGNOSIS — I5032 Chronic diastolic (congestive) heart failure: Secondary | ICD-10-CM

## 2021-06-23 ENCOUNTER — Telehealth: Payer: Self-pay | Admitting: Cardiovascular Disease

## 2021-06-23 DIAGNOSIS — Z954 Presence of other heart-valve replacement: Secondary | ICD-10-CM

## 2021-06-23 DIAGNOSIS — I050 Rheumatic mitral stenosis: Secondary | ICD-10-CM

## 2021-06-23 DIAGNOSIS — I5032 Chronic diastolic (congestive) heart failure: Secondary | ICD-10-CM

## 2021-06-23 MED ORDER — SPIRONOLACTONE 25 MG PO TABS: 12.5000 mg | ORAL_TABLET | Freq: Every day | ORAL | 7 refills | Status: DC

## 2021-06-23 NOTE — Telephone Encounter (Signed)
Requested Prescriptions   Signed Prescriptions Disp Refills   spironolactone (ALDACTONE) 25 MG tablet 30 tablet 7    Sig: Take 0.5 tablets (12.5 mg total) by mouth daily.    Authorizing Provider: Minna Merritts    Ordering User: Raelene Bott, Shyne Lehrke L

## 2021-06-23 NOTE — Telephone Encounter (Signed)
*  STAT* If patient is at the pharmacy, call can be transferred to refill team.   1. Which medications need to be refilled? (please list name of each medication and dose if known) spironolactone (ALDACTONE) 25 MG tablet  2. Which pharmacy/location (including street and city if local pharmacy) is medication to be sent to? [facility] [place], [place]  3. Do they need a 30 day or 90 day supply? [place]

## 2021-06-25 ENCOUNTER — Telehealth: Payer: Self-pay

## 2021-06-25 ENCOUNTER — Other Ambulatory Visit: Payer: Self-pay | Admitting: Physician Assistant

## 2021-06-25 DIAGNOSIS — R1084 Generalized abdominal pain: Secondary | ICD-10-CM

## 2021-06-25 DIAGNOSIS — B3731 Acute candidiasis of vulva and vagina: Secondary | ICD-10-CM

## 2021-06-25 DIAGNOSIS — E538 Deficiency of other specified B group vitamins: Secondary | ICD-10-CM

## 2021-06-25 DIAGNOSIS — R7989 Other specified abnormal findings of blood chemistry: Secondary | ICD-10-CM

## 2021-06-25 DIAGNOSIS — R197 Diarrhea, unspecified: Secondary | ICD-10-CM

## 2021-06-25 DIAGNOSIS — E559 Vitamin D deficiency, unspecified: Secondary | ICD-10-CM

## 2021-06-25 DIAGNOSIS — E782 Mixed hyperlipidemia: Secondary | ICD-10-CM

## 2021-06-25 MED ORDER — METRONIDAZOLE 500 MG PO TABS: 500.0000 mg | ORAL_TABLET | Freq: Two times a day (BID) | ORAL | 0 refills | Status: AC

## 2021-06-25 MED ORDER — FLUCONAZOLE 150 MG PO TABS: ORAL_TABLET | ORAL | 0 refills | Status: DC

## 2021-06-25 NOTE — Telephone Encounter (Signed)
Spoke to pt, informed her of meds and fasting labs. Pt requested med for yeast infection because of antibiotics. Informed her of interaction with warfarin and that she needs INR follow up with cardiologist.

## 2021-06-27 LAB — LIPID PANEL WITH LDL/HDL RATIO
Cholesterol, Total: 170 mg/dL (ref 100–199)
HDL: 36 mg/dL — ABNORMAL LOW (ref >39)
LDL Chol Calc (NIH): 106 mg/dL — ABNORMAL HIGH (ref 0–99)
LDL/HDL Ratio: 2.9 ratio (ref 0.0–3.2)
Triglycerides: 160 mg/dL — ABNORMAL HIGH (ref 0–149)
VLDL Cholesterol Cal: 28 mg/dL (ref 5–40)

## 2021-06-27 LAB — B12 AND FOLATE PANEL
Folate: >20 ng/mL (ref >3.0)
Vitamin B-12: 1058 pg/mL (ref 232–1245)

## 2021-06-27 LAB — VITAMIN D 25 HYDROXY (VIT D DEFICIENCY, FRACTURES): Vit D, 25-Hydroxy: 47.7 ng/mL (ref 30.0–100.0)

## 2021-06-27 LAB — TSH+FREE T4
Free T4: 1.36 ng/dL (ref 0.82–1.77)
TSH: 0.658 u[IU]/mL (ref 0.450–4.500)

## 2021-07-01 LAB — CLOSTRIDIUM DIFFICILE EIA: C difficile Toxins A+B, EIA: NEGATIVE

## 2021-07-02 ENCOUNTER — Telehealth: Payer: Self-pay

## 2021-07-02 NOTE — Telephone Encounter (Signed)
Spoke to pt and she is still having stomach pain and she finished ABX and she can't get in with GI until Oct at [facility].  She can't keep affording to go back to ER and not sure what she needs to do or is there something else she can take.  Sent message to Lauren and advised pt that I would let her know what Provider decides she needs to do.

## 2021-07-02 NOTE — Telephone Encounter (Signed)
-----   Message from [name], PA-C sent at 07/01/2021  4:54 PM EDT ----- Please let her know that her cholesterol is still elevated but improving. Her stool sample was negative for cdiff. Also her A1c was elevated again to 6.5 which is now in diabetic range. We will need to monitor this regularly and may need to discuss medications in future if rising

## 2021-07-03 ENCOUNTER — Telehealth: Payer: Self-pay

## 2021-07-03 NOTE — Telephone Encounter (Signed)
Pt is in need of a stat GI referral. Vivien Rota called [facility] and has left a message asking what the next steps should be on 07/03/2021 in order to make the referral stat instead of routine. Vivien Rota will call again on Monday to follow up. If this does not work, she will refer to another GI location for patient.

## 2021-07-07 ENCOUNTER — Telehealth: Payer: Self-pay

## 2021-07-07 ENCOUNTER — Other Ambulatory Visit: Payer: Self-pay | Admitting: Physician Assistant

## 2021-07-07 DIAGNOSIS — R1084 Generalized abdominal pain: Secondary | ICD-10-CM

## 2021-07-07 DIAGNOSIS — R110 Nausea: Secondary | ICD-10-CM

## 2021-07-07 DIAGNOSIS — R195 Other fecal abnormalities: Secondary | ICD-10-CM

## 2021-07-07 NOTE — Telephone Encounter (Signed)
Lvm for Leslie [name] with [facility], to see if they can get patient in as a stat/urgent, instead of waiting until [facility]

## 2021-07-08 ENCOUNTER — Telehealth: Payer: Self-pay

## 2021-07-08 NOTE — Telephone Encounter (Signed)
Leslie [name] states that the PCP office left her a voicemail and wanted patient seen as soon as possible in the office. Patient was seen in the office on by Dr. Allen Norris on 09/19/2018 so she is a establish patient. Patient has appointment with Dr. Allen Norris on 10/21/2021. Can we work her in sooner with you in the next week or do you care if she sees Dr. Marius Ditch I can get her worked in with her next Tuesday

## 2021-07-08 NOTE — Telephone Encounter (Signed)
Patient called back and made her a appointment

## 2021-07-08 NOTE — Telephone Encounter (Signed)
Urgent GI referral sent via [facility] to [facility]

## 2021-07-08 NOTE — Telephone Encounter (Signed)
Yes. Please add the patient.

## 2021-07-09 ENCOUNTER — Encounter: Payer: Self-pay | Admitting: Gastroenterology

## 2021-07-09 ENCOUNTER — Ambulatory Visit (INDEPENDENT_AMBULATORY_CARE_PROVIDER_SITE_OTHER): Payer: 59 | Admitting: Gastroenterology

## 2021-07-09 ENCOUNTER — Telehealth: Payer: Self-pay | Admitting: Cardiovascular Disease

## 2021-07-09 VITALS — BP 162/82 | HR 68 | Temp 98.7°F | Ht 64.0 in | Wt 248.0 lb

## 2021-07-09 DIAGNOSIS — K921 Melena: Secondary | ICD-10-CM | POA: Diagnosis not present

## 2021-07-09 DIAGNOSIS — K529 Noninfective gastroenteritis and colitis, unspecified: Secondary | ICD-10-CM | POA: Diagnosis not present

## 2021-07-09 DIAGNOSIS — R109 Unspecified abdominal pain: Secondary | ICD-10-CM

## 2021-07-09 NOTE — Telephone Encounter (Signed)
   Pre-operative Risk Assessment    Patient Name: Leslie [name]  DOB: 27-Jan-1965 MRN: [id number]      Request for Surgical Clearance    Procedure:   colonoscopy   Date of Surgery:  Clearance 07/16/21                                 Surgeon:  not indicated  Surgeon's Group or Practice Name:  [facility]  Phone number:  [phone] Fax number:  [phone]   Type of Clearance Requested:   - Pharmacy:  Hold Warfarin (Coumadin) instructions    Type of Anesthesia:  General    Additional requests/questions:    Manfred Arch   07/09/2021, 3:17 PM

## 2021-07-09 NOTE — Telephone Encounter (Signed)
Calling to f/u on Clearance tha was sent via fax today. Threasa Beards states that they need a response as soon as possible on whether or not pt is able to stop Coumadin by Saturday, 6/24 due to procedure to be done on Thursday 07/16/21. Please advise

## 2021-07-09 NOTE — Telephone Encounter (Signed)
   Name: Leslie [name]  DOB: 01-26-1965  MRN: [id number]   Primary Cardiologist: Ida Rogue, MD  Chart reviewed as part of pre-operative protocol coverage. We have been asked for guidance on coumadin bridging for colonoscopy in the setting of mechanical MVR.   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     Procedure: colonoscopy Date of procedure: 07/16/21   CrCl 38m/min using adjusted body weight Plt 295K   Pt requires bridging with Lovenox in order to hold warfarin for 5 days for procedure. This would need to be coordinated in clinic tomorrow since procedure is next week. [facility] they only see anticoag patients on Wednesdays. Called pt to see if she can come to [facility] on 6/23 for INR check and Lovenox bridging instructions. Pt is able to, appt has been scheduled. Will keep f/u appt scheduled in [place] after procedure. She requires Lovenox '1mg'$ /kg BID dosing due to her mechanical MVR.  I will route this recommendation to the requesting party via Epic fax function and remove from pre-op pool. Please call with questions.  ALedora Bottcher PA 07/09/2021, 4:46 PM

## 2021-07-09 NOTE — Telephone Encounter (Signed)
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    Procedure: colonoscopy Date of procedure: 07/16/21  CrCl 35m/min using adjusted body weight Plt 295K  Pt requires bridging with Lovenox in order to hold warfarin for 5 days for procedure. This would need to be coordinated in clinic tomorrow since procedure is next week. [facility], however they only see anticoag patients on Wednesdays. Called pt to see if she can come to [facility] on 6/23 for INR check and Lovenox bridging instructions. Pt is able to, appt has been scheduled. Will keep f/u appt scheduled in [place] week after procedure. She requires Lovenox '1mg'$ /kg BID dosing due to her mechanical MVR.

## 2021-07-09 NOTE — Telephone Encounter (Signed)
Clearance request has not been routed to PharmD pool yet. Will address in separate clearance encounter.

## 2021-07-09 NOTE — Progress Notes (Signed)
Primary Care Physician: [name], PA-C  Primary Gastroenterologist:  Dr. Lucilla Lame  Chief Complaint  Patient presents with   Abdominal Pain    HPI: Leslie [name] is a 56 y.o. female here after an urgent referral was sent due to abdominal discomfort.  The patient was seen by her primary care provider back in June 1 with abdominal pain.  There seems to have been a treatment with antibiotics and the patient had not felt better and was requesting GI evaluation.  The patient has been seen by me for an EGD and colonoscopy due to anemia and had a positive H. pylori test back in 2020.  The patient had a 7 mm colon polyp that was removed and a repeat colonoscopy recommended in 5 years.  The patient also had a capsule endoscopy that was negative for any sign of anemia or bleeding. The patient reports that she went to urgent care and was told to go to the emergency room because of abdominal pain.  The patient states that she had Hemoccult cards with blood in her stool.  The patient reports that she has been having black stools that are dark.  Her last hemoglobin was normal.  The patient also had a CT scan that showed findings consistent with colitis in the ascending colon and transverse colon.  The patient reports that her abdominal pain is in that area on the right upper quadrant and wraps around to her back.  There is no report of any unexplained weight loss.  The patient is on Coumadin because of a mitral valve replacement.  Past Medical History:  Diagnosis Date   (HFpEF) heart failure with preserved ejection fraction ([place])    a. 08/2017 Echo: EF 55-60%.  Grade 2 diastolic dysfunction; b. 06/7891 Echo: EF 50-55%, no rwma, Nl RV fxn, nl fxn'ing mech MV.   Allergy    Anemia    Anxiety    BRCA negative 03/22/2013   Bronchitis 02/19/2016   ON LEVAQUIN PO   Chest tightness    a. 08/2019 Cath: nl cors.   Cigarette smoker 09/11/2017   8-10 day   Dysrhythmia    GERD (gastroesophageal  reflux disease)    History of kidney stones    Hyperlipidemia    Hypertension    Interstitial lung disease ([place])    a. CT 2013 b. 02/2018 CXR noted recurrent intersistial changes ILD vs chronic bronchitis   Moderate mitral stenosis    a.  08/2017 TEE: EF 60 to 65%.  Moderate mitral stenosis.  Mean gradient 14 mmHg.  Valve area 2.59 cm by planimetry, 2.72 cm by pressure half-time.   PAF (paroxysmal atrial fibrillation) ([place])    a. 08/2017 s/p TEE/DCCV; b. CHA2DS2VASc = 2-->warfarin.   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 04/2020 s/p 27/29 mm Onyx mech mitral valve-->chronic coumadin; b. 05/2020 Echo: Nl fxn'ing mech MV.    Current Outpatient Medications  Medication Sig Dispense Refill   ALPRAZolam (XANAX) 0.5 MG tablet TAKE 1/2 TABLET BY MOUTH AT BEDTIME AS NEEDED FOR ANXIETY. 30 tablet 1   amLODipine (NORVASC) 10 MG tablet TAKE 1 TABLET(10 MG) BY MOUTH DAILY 90 tablet 1   amoxicillin-clavulanate (AUGMENTIN) 500-125 MG tablet Take 1 tablet (500 mg total) by mouth 2 (two) times  daily. 14 tablet 0   aspirin 81 MG EC tablet Take 81 mg by mouth daily.     atorvastatin (LIPITOR) 80 MG tablet TAKE 1 TABLET BY MOUTH EVERY DAY AT 6 PM 90 tablet 1   calcium carbonate (TUMS - DOSED IN MG ELEMENTAL CALCIUM) 500 MG chewable tablet Chew 500 mg by mouth daily as needed for indigestion or heartburn.     FEROCON capsule TAKE ONE CAPSULE BY MOUTH TWICE DAILY AFTER MEALS 180 capsule 1   fluconazole (DIFLUCAN) 150 MG tablet TAKE 1 TABLET(150 MG) BY MOUTH 1 TIME FOR 1 DOSE. MAY TAKE AN ADDITIONAL DOSE AFTER 3 DAYS IF STILL SYMPTOMATIC 3 tablet 0   omeprazole (PRILOSEC) 40 MG capsule Take 1 capsule (40 mg total) by mouth daily. 90 capsule 3   ondansetron (ZOFRAN-ODT) 4 MG  disintegrating tablet Take 1 tablet (4 mg total) by mouth every 8 (eight) hours as needed for nausea or vomiting. 20 tablet 0   oxyCODONE-acetaminophen (PERCOCET) 10-325 MG tablet Take 1 tablet by mouth every 6 (six) hours as needed.     potassium chloride SA (KLOR-CON) 20 MEQ tablet TAKE 1 TABLET(20 MEQ) BY MOUTH TWICE DAILY 180 tablet 2   spironolactone (ALDACTONE) 25 MG tablet Take 0.5 tablets (12.5 mg total) by mouth daily. 30 tablet 7   torsemide (DEMADEX) 20 MG tablet TAKE 2 TABLETS BY MOUTH TWICE DAILY 360 tablet 2   warfarin (COUMADIN) 2 MG tablet Take 1 tablet (2 mg total) by mouth as directed. Take 1/2-1 tablet daily as directed by the anti-coag clnic. 90 tablet 1   metoprolol succinate (TOPROL-XL) 100 MG 24 hr tablet Take 1 tablet (100 mg total) by mouth 2 (two) times daily. Take with or immediately following a meal. 180 tablet 3   No current facility-administered medications for this visit.    Allergies as of 07/09/2021 - Review Complete 07/09/2021  Allergen Reaction Noted   Morphine Hives and Rash 06/06/2012   Oxycodone hcl Hives and Itching 12/26/2014   Dilaudid [hydromorphone hcl] Itching 12/26/2014    ROS:  General: Negative for anorexia, weight loss, fever, chills, fatigue, weakness. ENT: Negative for hoarseness, difficulty swallowing , nasal congestion. CV: Negative for chest pain, angina, palpitations, dyspnea on exertion, peripheral edema.  Respiratory: Negative for dyspnea at rest, dyspnea on exertion, cough, sputum, wheezing.  GI: See history of present illness. GU:  Negative for dysuria, hematuria, urinary incontinence, urinary frequency, nocturnal urination.  Endo: Negative for unusual weight change.    Physical Examination:   BP (!) 162/82   Pulse 68   Temp 98.7 F (37.1 C) (Oral)   Ht '5\' 4"'  (1.626 m)   Wt 248 lb (112.5 kg)   BMI 42.57 kg/m   General: Well-nourished, well-developed in no acute distress.  Eyes: No icterus. Conjunctivae pink. Lungs:  Clear to auscultation bilaterally. Non-labored. Heart: Regular rate and rhythm, no murmurs rubs or gallops.  Abdomen: Bowel sounds are normal, nontender, nondistended, no hepatosplenomegaly or masses, no abdominal bruits or hernia , no rebound or guarding.   Extremities: No lower extremity edema. No clubbing or deformities. Neuro: Alert and oriented x 3.  Grossly intact. Skin: Warm and dry, no jaundice.   Psych: Alert and cooperative, normal mood and affect.  Labs:    Imaging Studies: CT ABDOMEN PELVIS WO CONTRAST  Result Date: 06/13/2021 CLINICAL DATA:  Acute nonlocalized abdominal pain. Epigastric pain and dark diarrhea for the past 3-4 days. On warfarin and last INR was 3.0 06/10/2021. EXAM: CT ABDOMEN AND  PELVIS WITHOUT CONTRAST TECHNIQUE: Multidetector CT imaging of the abdomen and pelvis was performed following the standard protocol without IV contrast. RADIATION DOSE REDUCTION: This exam was performed according to the departmental dose-optimization program which includes automated exposure control, adjustment of the mA and/or kV according to patient size and/or use of iterative reconstruction technique. COMPARISON:  [facility] 05/06/2020; CT abdomen and pelvis without contrast 03/26/2020; chest radiograph 06/19/2020 FINDINGS: Lower chest: Mild curvilinear subsegmental atelectasis versus scarring within the right middle lobe and lingula, similar to prior. No pleural effusion. Partial visualization of likely mitral valve replacement. Partial visualization of left atrial appendage clamp seen on prior 06/19/2020 chest radiograph. Hepatobiliary: Smooth liver contours. Status post cholecystectomy. No gross liver lesion is seen. No biliary ductal dilatation is seen. Pancreas: Unremarkable. No pancreatic ductal dilatation or surrounding inflammatory changes. Spleen: Normal in size without focal abnormality. Adrenals/Urinary Tract: Normal bilateral adrenals. There is a 2.9 cm fluid attenuation cyst within the  upper pole of the right kidney, unchanged. Just medial to this there is an adjacent nonobstructing stone measuring up to 7 mm, unchanged. No hydronephrosis. No focal urinary bladder wall thickening. No ureteral stone. Stomach/Bowel: Minimal scattered distal colonic diverticulosis. Moderate diverticulosis in the region of the splenic flexure with mild new calcification versus retained oral contrast within the diverticula. The terminal ileum is unremarkable. Normal appendix. Mild wall thickening and inflammatory stranding within the fat surrounding the proximal ascending colon (axial series 2, image 44, coronal series 5, image 40) may represent mild colitis. No dilated loops of bowel to indicate bowel obstruction. Vascular/Lymphatic: Moderate atherosclerotic calcifications. No abdominal aortic aneurysm. No mesenteric retroperitoneal, or pelvic pathologically enlarged lymph nodes by CT criteria. Reproductive: The uterus is present.  No gross adnexal abnormality. Other: No ventral abdominal wall hernia. No free air or free fluid is seen within the abdomen or pelvis. Musculoskeletal: Mild posterior L5-S1 disc space narrowing. IMPRESSION: 1. No nephrolithiasis or hydronephrosis. 2. Mild inflammatory changes within the ascending colon and proximal transverse colon, suggesting colitis. No high-grade superior mesenteric artery atherosclerosis is seen, and the etiology may be infectious/inflammatory. 3. Colonic diverticulosis greatest within the splenic flexure without regional inflammatory changes to indicate acute diverticulitis. Electronically Signed   By: Yvonne Kendall M.D.   On: 06/13/2021 13:47   MM Outside Films Mammo  Result Date: 06/10/2021 This examination belongs to an outside facility and is stored here for comparison purposes only.  Contact the originating outside institution for any associated report or interpretation.  MM Outside Films Mammo  Result Date: 06/10/2021 This examination belongs to an  outside facility and is stored here for comparison purposes only.  Contact the originating outside institution for any associated report or interpretation.  MM Outside Films Mammo  Result Date: 06/10/2021 This examination belongs to an outside facility and is stored here for comparison purposes only.  Contact the originating outside institution for any associated report or interpretation.  MM Outside Films Mammo  Result Date: 06/10/2021 This examination belongs to an outside facility and is stored here for comparison purposes only.  Contact the originating outside institution for any associated report or interpretation.  MM Outside Films Mammo  Result Date: 06/10/2021 This examination belongs to an outside facility and is stored here for comparison purposes only.  Contact the originating outside institution for any associated report or interpretation.   Assessment and Plan:   Leslie [name] is a 56 y.o. y/o female who comes in today with a history of a ER visit with heme positive stools  and a CT scan showing colitis predominately on the right side and the patient also reports black stools despite having normal hemoglobin.  The patient reports that she has had her gallbladder removed in the past.  The patient will be set up for an EGD and colonoscopy.  The patient needs to be cleared for stopping her Coumadin.  The patient will be set up for an EGD and colonoscopy after clearance can be obtained for the patient to stop her anticoagulation.  The patient has been explained the plan and agrees with it.     Lucilla Lame, MD. Marval Regal    Note: This dictation was prepared with Dragon dictation along with smaller phrase technology. Any transcriptional errors that result from this process are unintentional.

## 2021-07-10 ENCOUNTER — Other Ambulatory Visit: Payer: Self-pay

## 2021-07-10 ENCOUNTER — Ambulatory Visit (INDEPENDENT_AMBULATORY_CARE_PROVIDER_SITE_OTHER): Payer: 59

## 2021-07-10 ENCOUNTER — Telehealth: Payer: Self-pay

## 2021-07-10 DIAGNOSIS — I050 Rheumatic mitral stenosis: Secondary | ICD-10-CM

## 2021-07-10 DIAGNOSIS — Z5181 Encounter for therapeutic drug level monitoring: Secondary | ICD-10-CM

## 2021-07-10 DIAGNOSIS — I4891 Unspecified atrial fibrillation: Secondary | ICD-10-CM

## 2021-07-10 LAB — POCT INR: INR: 3.8 — AB (ref 2.0–3.0)

## 2021-07-10 MED ORDER — ENOXAPARIN SODIUM 120 MG/0.8ML IJ SOSY: 120.0000 mg | PREFILLED_SYRINGE | Freq: Two times a day (BID) | INTRAMUSCULAR | 1 refills | Status: DC

## 2021-07-10 MED ORDER — PEG 3350-KCL-NA BICARB-NACL 420 G PO SOLR: 4000.0000 mL | Freq: Once | ORAL | 0 refills | Status: AC

## 2021-07-13 ENCOUNTER — Other Ambulatory Visit: Payer: Self-pay | Admitting: Physician Assistant

## 2021-07-13 DIAGNOSIS — F419 Anxiety disorder, unspecified: Secondary | ICD-10-CM

## 2021-07-14 ENCOUNTER — Telehealth: Payer: Self-pay

## 2021-07-16 ENCOUNTER — Ambulatory Visit
Admission: RE | Admit: 2021-07-16 | Discharge: 2021-07-16 | Disposition: A | Discharge status: Home / Self Care | Payer: 59 | Attending: Gastroenterology | Admitting: Gastroenterology

## 2021-07-16 ENCOUNTER — Other Ambulatory Visit: Payer: Self-pay

## 2021-07-16 ENCOUNTER — Encounter
Admission: RE | Disposition: A | Discharge status: Home / Self Care | Payer: Self-pay | Source: Home / Self Care | Attending: Gastroenterology

## 2021-07-16 ENCOUNTER — Ambulatory Visit: Payer: 59 | Admitting: Certified Registered Nurse Anesthetist

## 2021-07-16 ENCOUNTER — Encounter: Payer: Self-pay | Admitting: Gastroenterology

## 2021-07-16 DIAGNOSIS — R0602 Shortness of breath: Secondary | ICD-10-CM | POA: Insufficient documentation

## 2021-07-16 DIAGNOSIS — R1084 Generalized abdominal pain: Secondary | ICD-10-CM | POA: Insufficient documentation

## 2021-07-16 DIAGNOSIS — F419 Anxiety disorder, unspecified: Secondary | ICD-10-CM | POA: Insufficient documentation

## 2021-07-16 DIAGNOSIS — K222 Esophageal obstruction: Secondary | ICD-10-CM | POA: Diagnosis not present

## 2021-07-16 DIAGNOSIS — I5032 Chronic diastolic (congestive) heart failure: Secondary | ICD-10-CM | POA: Insufficient documentation

## 2021-07-16 DIAGNOSIS — K921 Melena: Secondary | ICD-10-CM | POA: Diagnosis not present

## 2021-07-16 DIAGNOSIS — I480 Paroxysmal atrial fibrillation: Secondary | ICD-10-CM | POA: Insufficient documentation

## 2021-07-16 DIAGNOSIS — I110 Hypertensive heart disease with heart failure: Secondary | ICD-10-CM | POA: Insufficient documentation

## 2021-07-16 DIAGNOSIS — K449 Diaphragmatic hernia without obstruction or gangrene: Secondary | ICD-10-CM | POA: Insufficient documentation

## 2021-07-16 DIAGNOSIS — K640 First degree hemorrhoids: Secondary | ICD-10-CM | POA: Diagnosis not present

## 2021-07-16 DIAGNOSIS — Z87891 Personal history of nicotine dependence: Secondary | ICD-10-CM | POA: Insufficient documentation

## 2021-07-16 DIAGNOSIS — F32A Depression, unspecified: Secondary | ICD-10-CM | POA: Insufficient documentation

## 2021-07-16 DIAGNOSIS — K3189 Other diseases of stomach and duodenum: Secondary | ICD-10-CM | POA: Diagnosis not present

## 2021-07-16 DIAGNOSIS — K529 Noninfective gastroenteritis and colitis, unspecified: Secondary | ICD-10-CM

## 2021-07-16 DIAGNOSIS — Z6841 Body Mass Index (BMI) 40.0 and over, adult: Secondary | ICD-10-CM | POA: Insufficient documentation

## 2021-07-16 DIAGNOSIS — K219 Gastro-esophageal reflux disease without esophagitis: Secondary | ICD-10-CM | POA: Diagnosis not present

## 2021-07-16 HISTORY — PX: COLONOSCOPY WITH PROPOFOL: SHX5780

## 2021-07-16 HISTORY — PX: ESOPHAGOGASTRODUODENOSCOPY: SHX5428

## 2021-07-16 SURGERY — COLONOSCOPY WITH PROPOFOL
Anesthesia: General

## 2021-07-16 MED ORDER — SODIUM CHLORIDE 0.9 % IV SOLN: INTRAVENOUS | Status: DC

## 2021-07-16 MED ORDER — PROPOFOL 500 MG/50ML IV EMUL
INTRAVENOUS | Status: DC | PRN
  Administered 2021-07-16: 150 ug/kg/min via INTRAVENOUS

## 2021-07-16 MED ORDER — LIDOCAINE HCL (CARDIAC) PF 100 MG/5ML IV SOSY
PREFILLED_SYRINGE | INTRAVENOUS | Status: DC | PRN
  Administered 2021-07-16: 50 mg via INTRAVENOUS

## 2021-07-16 MED ORDER — PROPOFOL 10 MG/ML IV BOLUS
INTRAVENOUS | Status: DC | PRN
  Administered 2021-07-16: 60 mg via INTRAVENOUS
  Administered 2021-07-16: 20 mg via INTRAVENOUS

## 2021-07-16 NOTE — Op Note (Signed)
[facility] Patient Name: Leslie [name] Procedure Date: 07/16/2021 12:15 PM MRN: [id number] Account #: [id number] Date of Birth: [birth year] Admit Type: Outpatient Age: 56 Room: [facility] ENDO ROOM 1 Gender: Female Note Status: Finalized Instrument Name: Upper Endoscope [id number] Procedure:             Upper GI endoscopy Indications:           Generalized abdominal pain Providers:             Lucilla Lame MD, MD Medicines:             Propofol per Anesthesia Complications:         No immediate complications. Procedure:             Pre-Anesthesia Assessment:                        - Prior to the procedure, a History and Physical was                         performed, and patient medications and allergies were                         reviewed. The patient's tolerance of previous                         anesthesia was also reviewed. The risks and benefits                         of the procedure and the sedation options and risks                         were discussed with the patient. All questions were                         answered, and informed consent was obtained. Prior                         Anticoagulants: The patient has taken no previous                         anticoagulant or antiplatelet agents. ASA Grade                         Assessment: II - A patient with mild systemic disease.                         After reviewing the risks and benefits, the patient                         was deemed in satisfactory condition to undergo the                         procedure.                        After obtaining informed consent, the endoscope was                         passed under direct vision. Throughout the procedure,  the patient's blood pressure, pulse, and oxygen                         saturations were monitored continuously. The Endoscope                         was introduced through the mouth, and advanced to the                          second part of duodenum. The upper GI endoscopy was                         accomplished without difficulty. The patient tolerated                         the procedure well. Findings:      A medium-sized hiatal hernia was present.      One benign-appearing, intrinsic mild stenosis was found at the       gastroesophageal junction. The stenosis was traversed. A TTS dilator was       passed through the scope. Dilation with a 15-16.5-18 mm balloon dilator       was performed to 18 mm. The dilation site was examined following       endoscope reinsertion and showed complete resolution of luminal       narrowing.      The entire examined stomach was normal. Biopsies were taken with a cold       forceps for Helicobacter pylori testing.      The examined duodenum was normal. Impression:            - Medium-sized hiatal hernia.                        - Benign-appearing esophageal stenosis. Dilated.                        - Normal stomach. Biopsied.                        - Normal examined duodenum. Recommendation:        - Discharge patient to home.                        - Resume previous diet.                        - Continue present medications.                        - Await pathology results.                        - Perform a colonoscopy today. Procedure Code(s):     --- Professional ---                        [phone], Esophagogastroduodenoscopy, flexible,                         transoral; with transendoscopic balloon dilation of  esophagus (less than 30 mm diameter)                        43239, 59, Esophagogastroduodenoscopy, flexible,                         transoral; with biopsy, single or multiple Diagnosis Code(s):     --- Professional ---                        R10.84, Generalized abdominal pain                        K22.2, Esophageal obstruction CPT copyright 2019 American Medical Association. All rights reserved. The codes  documented in this report are preliminary and upon coder review may  be revised to meet current compliance requirements. Lucilla Lame MD, MD 07/16/2021 12:32:35 PM This report has been signed electronically. Number of Addenda: 0 Note Initiated On: 07/16/2021 12:15 PM Estimated Blood Loss:  Estimated blood loss: none.      [facility]

## 2021-07-16 NOTE — Anesthesia Postprocedure Evaluation (Signed)
Anesthesia Post Note  Patient: Venicia L Cajuste  Procedure(s) Performed: COLONOSCOPY WITH PROPOFOL ESOPHAGOGASTRODUODENOSCOPY (EGD)  Patient location during evaluation: Endoscopy Anesthesia Type: General Level of consciousness: awake and alert Pain management: pain level controlled Vital Signs Assessment: post-procedure vital signs reviewed and stable Respiratory status: spontaneous breathing, nonlabored ventilation, respiratory function stable and patient connected to nasal cannula oxygen Cardiovascular status: blood pressure returned to baseline and stable Postop Assessment: no apparent nausea or vomiting Anesthetic complications: no   No notable events documented.   Last Vitals:  Vitals:   07/16/21 1306 07/16/21 1314  BP: 130/68 139/65  Pulse: 66 62  Resp: 14 17  Temp:    SpO2: 100% 100%    Last Pain:  Vitals:   07/16/21 1314  TempSrc:   PainSc: 0-No pain                 Precious Haws Kiano Terrien

## 2021-07-16 NOTE — Anesthesia Preprocedure Evaluation (Signed)
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 on medications and Pt. on home beta blockers +CHF  (-) CAD and (-) Past MI + dysrhythmias Atrial Fibrillation + Valvular Problems/Murmurs (MS)  Rhythm:Regular Rate:Normal - Diastolic murmurs S/p MVR in 2022  TTE 2022: 1. Left ventricular ejection fraction, by estimation, is 50 to 55%. The  left ventricle has low normal function. The left ventricle has no regional  wall motion abnormalities. Left ventricular diastolic parameters are  indeterminate.  2. Right ventricular systolic function is low normal. The right  ventricular size is normal.  3. The mitral valve has been repaired/replaced. No evidence of mitral  valve regurgitation. The mean mitral valve gradient is 6.0 mmHg. There is  a 27/29 Onyx mechanical valve present in the mitral position. Procedure  Date: 04/29/20. Echo findings are  consistent with normal structure and function of the mitral valve  prosthesis.  4. The aortic valve was not well visualized. Aortic valve regurgitation  is not visualized.  5. The inferior vena cava is normal in size with <50% respiratory  variability, suggesting right atrial pressure of 8 mmHg.    Neuro/Psych PSYCHIATRIC DISORDERS Anxiety Depression negative neurological ROS     GI/Hepatic Neg liver ROS,  GERD  Medicated,  Endo/Other  neg diabetesMorbid obesity  Renal/GU negative Renal ROS     Musculoskeletal  (+) Arthritis , Osteoarthritis,    Abdominal (+) + obese,  Abdomen: soft. Bowel sounds: normal.  Peds  Hematology  (+) Blood dyscrasia, anemia ,   Anesthesia Other Findings Past Medical History: No date: (HFpEF) heart failure with preserved ejection fraction ([place])     Comment:  a. 08/2017 Echo: EF 55-60%.  Grade 2 diastolic               dysfunction; b. 05/2020 Echo: EF 50-55%, no rwma, Nl RV               fxn, nl fxn'ing mech MV. No date: Allergy No date: Anemia No date: Anxiety 03/22/2013: BRCA negative 02/19/2016: Bronchitis     Comment:  ON LEVAQUIN PO No date: Chest tightness     Comment:  a. 08/2019 Cath: nl cors. 09/11/2017: Cigarette smoker     Comment:  8-10 day No date: Dysrhythmia No date: GERD (gastroesophageal reflux disease) No date: History of kidney stones No date: Hyperlipidemia No date: Hypertension No date: Interstitial lung disease ([place])     Comment:  a. CT 2013 b. 02/2018 CXR noted recurrent intersistial               changes ILD vs chronic bronchitis No date: Moderate mitral stenosis     Comment:  a.  08/2017 TEE: EF 60 to 65%.  Moderate mitral stenosis.              Mean gradient 14 mmHg.  Valve area 2.59 cm by  planimetry, 2.72 cm by pressure half-time. No date: PAF (paroxysmal atrial fibrillation) ([place])     Comment:  a. 08/2017 s/p TEE/DCCV; b. CHA2DS2VASc = 2-->warfarin. 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 04/2020 s/p 27/29 mm Onyx mech mitral valve-->chronic               coumadin; b. 05/2020 Echo: Nl fxn'ing mech MV.   Reproductive/Obstetrics                             Anesthesia Physical  Anesthesia Plan  ASA: 3  Anesthesia Plan: General   Post-op Pain Management: Minimal or no pain anticipated   Induction: Intravenous  PONV Risk Score and Plan: 3 and Ondansetron, Treatment may vary due to age or medical condition, Propofol infusion and TIVA  Airway Management Planned: Natural Airway  Additional Equipment: None  Intra-op Plan:   Post-operative Plan:   Informed Consent: I have reviewed the patients History and Physical, chart, labs and discussed the procedure including the risks, benefits and alternatives for the proposed anesthesia with the patient or authorized representative who has indicated his/her understanding and acceptance.     Dental advisory given  Plan Discussed with: CRNA  Anesthesia Plan Comments: (Discussed risks of anesthesia with patient, including possibility of difficulty with spontaneous ventilation under anesthesia necessitating airway intervention, PONV, and rare risks such as cardiac or respiratory or neurological events, and allergic reactions. Discussed the role of CRNA in patient's perioperative care. Patient understands.)        Anesthesia Quick Evaluation

## 2021-07-16 NOTE — Op Note (Signed)
[facility] Gastroenterology Patient Name: Leslie [name] Procedure Date: 07/16/2021 12:12 PM MRN: [id number] Account #: [id number] Date of Birth: 1965/02/28 Admit Type: Outpatient Age: 56 Room: [facility] ENDO ROOM 1 Gender: Female Note Status: Finalized Instrument Name: Jasper Riling [id number] Procedure:             Colonoscopy Indications:           Generalized abdominal pain Providers:             Lucilla Lame MD, MD Medicines:             Propofol per Anesthesia Complications:         No immediate complications. Procedure:             Pre-Anesthesia Assessment:                        - Prior to the procedure, a History and Physical was                         performed, and patient medications and allergies were                         reviewed. The patient's tolerance of previous                         anesthesia was also reviewed. The risks and benefits                         of the procedure and the sedation options and risks                         were discussed with the patient. All questions were                         answered, and informed consent was obtained. Prior                         Anticoagulants: The patient has taken no previous                         anticoagulant or antiplatelet agents. ASA Grade                         Assessment: II - A patient with mild systemic disease.                         After reviewing the risks and benefits, the patient                         was deemed in satisfactory condition to undergo the                         procedure.                        After obtaining informed consent, the colonoscope was                         passed under direct vision. Throughout the procedure,  the patient's blood pressure, pulse, and oxygen                         saturations were monitored continuously. The                         Colonoscope was introduced through the anus and                          advanced to the the cecum, identified by appendiceal                         orifice and ileocecal valve. The colonoscopy was                         performed without difficulty. The patient tolerated                         the procedure well. The quality of the bowel                         preparation was good. Findings:      The perianal and digital rectal examinations were normal.      Non-bleeding internal hemorrhoids were found during retroflexion. The       hemorrhoids were Grade I (internal hemorrhoids that do not prolapse).      The exam was otherwise without abnormality. Impression:            - Non-bleeding internal hemorrhoids.                        - The examination was otherwise normal.                        - No specimens collected. Recommendation:        - Discharge patient to home.                        - Resume previous diet.                        - Continue present medications. Procedure Code(s):     --- Professional ---                        [phone], Colonoscopy, flexible; diagnostic, including                         collection of specimen(s) by brushing or washing, when                         performed (separate procedure) Diagnosis Code(s):     --- Professional ---                        R10.84, Generalized abdominal pain CPT copyright 2019 American Medical Association. All rights reserved. The codes documented in this report are preliminary and upon coder review may  be revised to meet current compliance requirements. Lucilla Lame MD, MD 07/16/2021 12:46:52 PM This report has been signed electronically. Number of Addenda: 0 Note Initiated On: 07/16/2021 12:12 PM Scope Withdrawal  Time: 0 hours 4 minutes 29 seconds  Total Procedure Duration: 0 hours 11 minutes 46 seconds  Estimated Blood Loss:  Estimated blood loss: none.      [facility]

## 2021-07-16 NOTE — H&P (Signed)
Lucilla Lame, MD [address] [place], [place] [address] [phone] Fax : [phone]  Primary Care Physician:  [name], PA-C Primary Gastroenterologist:  Dr. Allen Norris  Pre-Procedure History & Physical: HPI:  Leslie [name] is a 56 y.o. female is here for an endoscopy and colonoscopy.   Past Medical History:  Diagnosis Date   (HFpEF) heart failure with preserved ejection fraction ([place])    a. 08/2017 Echo: EF 55-60%.  Grade 2 diastolic dysfunction; b. 08/5629 Echo: EF 50-55%, no rwma, Nl RV fxn, nl fxn'ing mech MV.   Allergy    Anemia    Anxiety    BRCA negative 03/22/2013   Bronchitis 02/19/2016   ON LEVAQUIN PO   Chest tightness    a. 08/2019 Cath: nl cors.   Cigarette smoker 09/11/2017   8-10 day   Dysrhythmia    GERD (gastroesophageal reflux disease)    History of kidney stones    Hyperlipidemia    Hypertension    Interstitial lung disease ([place])    a. CT 2013 b. 02/2018 CXR noted recurrent intersistial changes ILD vs chronic bronchitis   Moderate mitral stenosis    a.  08/2017 TEE: EF 60 to 65%.  Moderate mitral stenosis.  Mean gradient 14 mmHg.  Valve area 2.59 cm by planimetry, 2.72 cm by pressure half-time.   PAF (paroxysmal atrial fibrillation) ([place])    a. 08/2017 s/p TEE/DCCV; b. CHA2DS2VASc = 2-->warfarin.   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 04/2020 s/p 27/29 mm Onyx mech mitral valve-->chronic coumadin; b. 05/2020 Echo: Nl fxn'ing mech MV.    Past Surgical History:  Procedure Laterality Date   ABDOMINAL HYSTERECTOMY     total   ABDOMINAL HYSTERECTOMY     BREAST BIOPSY Bilateral 2012   BREAST BIOPSY Right 08-07-12   fibroadenomatous changes and columnar cells   BREAST BIOPSY  02/03/2015    stereo byrnett   BUBBLE STUDY  04/01/2020   Procedure: BUBBLE STUDY;  Surgeon: [name], MD;  Location: [place];  Service: Cardiovascular;;   CARDIAC CATHETERIZATION     CHOLECYSTECTOMY N/A 02/27/2016   Procedure: LAPAROSCOPIC CHOLECYSTECTOMY;  Surgeon: Christene Lye, MD;  Location: [facility] ORS;  Service: General;  Laterality: N/A;   CLIPPING OF ATRIAL APPENDAGE  04/29/2020   Procedure: CLIPPING OF ATRIAL APPENDAGE USING ATRICURE  PRO2 CLIP SIZE 45MM;  Surgeon: Rexene Alberts, MD;  [facility];  Service: Open Heart Surgery;;   COLONOSCOPY WITH PROPOFOL N/A 09/26/2018   Procedure: COLONOSCOPY WITH PROPOFOL;  Surgeon: Lucilla Lame, MD;  Location: [facility] ENDOSCOPY;  Service: Endoscopy;  Laterality: N/A;   CYSTOSCOPY W/ RETROGRADES Right 08/18/2018   Procedure: CYSTOSCOPY WITH RETROGRADE PYELOGRAM;  Surgeon: Billey Co, MD;  Location: [facility] ORS;  Service: Urology;  Laterality: Right;   CYSTOSCOPY/URETEROSCOPY/HOLMIUM LASER/STENT PLACEMENT Right 08/18/2018   Procedure: CYSTOSCOPY/URETEROSCOPY/STENT PLACEMENT;  Surgeon: Billey Co, MD;  Location: [facility] ORS;  Service: Urology;  Laterality: Right;   DIAGNOSTIC LAPAROSCOPY     ESOPHAGOGASTRODUODENOSCOPY (EGD) WITH PROPOFOL N/A 09/26/2018   Procedure: ESOPHAGOGASTRODUODENOSCOPY (EGD) WITH PROPOFOL;  Surgeon: Lucilla Lame, MD;  Location: [facility] ENDOSCOPY;  Service: Endoscopy;  Laterality: N/A;   GIVENS CAPSULE STUDY N/A 11/03/2018   Procedure: GIVENS CAPSULE STUDY;  Surgeon: Lucilla Lame, MD;  Location: [facility] ENDOSCOPY;  Service: Endoscopy;  Laterality: N/A;   JOINT REPLACEMENT Left    knee   KNEE ARTHROPLASTY Right 04/09/2019   Procedure: COMPUTER ASSISTED TOTAL KNEE ARTHROPLASTY;  Surgeon: Dereck Leep, MD;  Location: [facility] ORS;  Service: Orthopedics;  Laterality: Right;   KNEE CLOSED REDUCTION Left 04/15/2015   Procedure: CLOSED MANIPULATION KNEE;  Surgeon: Thornton Park, MD;  Location: [facility];  Service: Orthopedics;   Laterality: Left;   KNEE SURGERY     MAZE N/A 04/29/2020   Procedure: MAZE;  Surgeon: Rexene Alberts, MD;  Location: [place];  Service: Open Heart Surgery;  Laterality: N/A;   MITRAL VALVE REPLACEMENT N/A 04/29/2020   Procedure: MITRAL VALVE (MV) REPLACEMENT USING ON-X VALVE SIZE 27/29MM;  Surgeon: Rexene Alberts, MD;  Location: [place];  Service: Open Heart Surgery;  Laterality: N/A;   MULTIPLE EXTRACTIONS WITH ALVEOLOPLASTY N/A 02/28/2020   Procedure: MULTIPLE EXTRACTION WITH ALVEOLOPLASTY;  Surgeon: [name], DMD;  Location: [place];  Service: Dentistry;  Laterality: N/A;   RIGHT/LEFT HEART CATH AND CORONARY ANGIOGRAPHY Bilateral 09/06/2019   Procedure: RIGHT/LEFT HEART CATH AND CORONARY ANGIOGRAPHY;  Surgeon: Minna Merritts, MD;  Location: [place];  Service: Cardiovascular;  Laterality: Bilateral;   TEE WITHOUT CARDIOVERSION N/A 09/13/2017   Procedure: TRANSESOPHAGEAL ECHOCARDIOGRAM (TEE);  Surgeon: Nelva Bush, MD;  Location: [facility];  Service: Cardiovascular;  Laterality: N/A;   TEE WITHOUT CARDIOVERSION N/A 04/01/2020   Procedure: TRANSESOPHAGEAL ECHOCARDIOGRAM (TEE);  Surgeon: [name], MD;  Location: [facility] ENDOSCOPY;  Service: Cardiovascular;  Laterality: N/A;   TEE WITHOUT CARDIOVERSION N/A 04/29/2020   Procedure: TRANSESOPHAGEAL ECHOCARDIOGRAM (TEE);  Surgeon: Rexene Alberts, MD;  Location: [place];  Service: Open Heart Surgery;  Laterality: N/A;   TOTAL KNEE ARTHROPLASTY Left 12/25/2014   Procedure: TOTAL KNEE ARTHROPLASTY;  Surgeon: Thornton Park, MD;  Location: [facility] ORS;  Service: Orthopedics;  Laterality: Left;   TUBAL LIGATION      Prior to Admission medications   Medication Sig Start Date End Date Taking? Authorizing Provider  ALPRAZolam Duanne Moron) 0.5 MG tablet TAKE 1/2 TABLET BY MOUTH AT BEDTIME AS NEEDED FOR ANXIETY 07/15/21  Yes McDonough, Lauren K, PA-C  amLODipine (NORVASC) 10 MG tablet TAKE 1 TABLET(10 MG) BY MOUTH DAILY 02/10/21  Yes Gollan,  Kathlene November, MD  aspirin 81 MG EC tablet Take 81 mg by mouth daily.   Yes [provider]  atorvastatin (LIPITOR) 80 MG tablet TAKE 1 TABLET BY MOUTH EVERY DAY AT 6 PM 06/01/21  Yes Gollan, Kathlene November, MD  enoxaparin (LOVENOX) 120 MG/0.8ML injection Inject 0.8 mLs (120 mg total) into the skin every 12 (twelve) hours. 07/10/21  Yes Gollan, Kathlene November, MD  FEROCON capsule TAKE ONE CAPSULE BY MOUTH TWICE DAILY AFTER MEALS 02/17/21  Yes Abernathy, Alyssa, NP  metoprolol succinate (TOPROL-XL) 100 MG 24 hr tablet Take 1 tablet (100 mg total) by mouth 2 (two) times daily. Take with or immediately following a meal. 09/19/20 07/16/21 Yes Theora Gianotti, NP  omeprazole (PRILOSEC) 40 MG capsule Take 1 capsule (40 mg total) by mouth daily. 07/15/20  Yes Lavera Guise, MD  potassium chloride SA (KLOR-CON) 20 MEQ tablet TAKE 1 TABLET(20 MEQ) BY MOUTH TWICE DAILY 12/01/20  Yes Gollan, Kathlene November, MD  spironolactone (ALDACTONE) 25 MG tablet Take 0.5 tablets (12.5 mg total) by mouth daily. 06/23/21  Yes Gollan, Kathlene November, MD  torsemide (DEMADEX) 20 MG tablet TAKE 2 TABLETS BY MOUTH TWICE DAILY  05/18/21  Yes Minna Merritts, MD  amoxicillin-clavulanate (AUGMENTIN) 500-125 MG tablet Take 1 tablet (500 mg total) by mouth 2 (two) times daily. 06/13/21   Harvest Dark, MD  calcium carbonate (TUMS - DOSED IN MG ELEMENTAL CALCIUM) 500 MG chewable tablet Chew 500 mg by mouth daily as needed for indigestion or heartburn.    [provider]  fluconazole (DIFLUCAN) 150 MG tablet TAKE 1 TABLET(150 MG) BY MOUTH 1 TIME FOR 1 DOSE. MAY TAKE AN ADDITIONAL DOSE AFTER 3 DAYS IF STILL SYMPTOMATIC 06/25/21   McDonough, Lauren K, PA-C  ondansetron (ZOFRAN-ODT) 4 MG disintegrating tablet Take 1 tablet (4 mg total) by mouth every 8 (eight) hours as needed for nausea or vomiting. 06/13/21   Harvest Dark, MD  oxyCODONE-acetaminophen (PERCOCET) 10-325 MG tablet Take 1 tablet by mouth every 6 (six) hours as needed. 10/06/20    [provider]  warfarin (COUMADIN) 2 MG tablet Take 1 tablet (2 mg total) by mouth as directed. Take 1/2-1 tablet daily as directed by the anti-coag clnic. 02/02/21   End, Harrell Gave, MD    Allergies as of 07/10/2021 - Review Complete 07/09/2021  Allergen Reaction Noted   Morphine Hives and Rash 06/06/2012   Oxycodone hcl Hives and Itching 12/26/2014   Dilaudid [hydromorphone hcl] Itching 12/26/2014    Family History  Problem Relation Age of Onset   Cancer Mother 93       breast   Hypertension Mother    Cancer Maternal Aunt        breast   Cancer Maternal Grandmother        breast   Osteoarthritis Father    Hypertension Father     Social History   Socioeconomic History   Marital status: Married    Spouse name: Not on file   Number of children: Not on file   Years of education: Not on file   Highest education level: Not on file  Occupational History   Not on file  Tobacco Use   Smoking status: Former    Packs/day: 0.50    Years: 20.00    Total pack years: 10.00    Types: Cigarettes    Quit date: 01/2016    Years since quitting: 5.4   Smokeless tobacco: Never  Vaping Use   Vaping Use: Never used  Substance and Sexual Activity   Alcohol use: Yes    Comment: rarely   Drug use: No   Sexual activity: Not on file  Other Topics Concern   Not on file  Social History Narrative   Not on file   Social Determinants of Health   Financial Resource Strain: Low Risk  (05/07/2020)   Overall Financial Resource Strain (CARDIA)    Difficulty of Paying Living Expenses: Not very hard  Food Insecurity: No Food Insecurity (05/07/2020)   Hunger Vital Sign    Worried About Running Out of Food in the Last Year: Never true    Ran Out of Food in the Last Year: Never true  Transportation Needs: No Transportation Needs (05/07/2020)   PRAPARE - Hydrologist (Medical): No    Lack of Transportation (Non-Medical): No  Physical Activity: Not on file   Stress: Not on file  Social Connections: Not on file  Intimate Partner Violence: Not on file    Review of Systems: See HPI, otherwise negative ROS  Physical Exam: BP (!) 167/68   Pulse 71   Temp (!) 97.2 F (36.2 C) (Temporal)  Resp 16   Ht _0  (1.626 m)   Wt 112.9 kg   SpO2 100%   BMI 42.74 kg/m  General:   Alert,  pleasant and cooperative in NAD Head:  Normocephalic and atraumatic. Neck:  Supple; no masses or thyromegaly. Lungs:  Clear throughout to auscultation.    Heart:  Regular rate and rhythm. Abdomen:  Soft, nontender and nondistended. Normal bowel sounds, without guarding, and without rebound.   Neurologic:  Alert and  oriented x4;  grossly normal neurologically.  Impression/Plan: Leslie [name] is here for an endoscopy and colonoscopy to be performed for the right side and the patient also reports black stools  Risks, benefits, limitations, and alternatives regarding  endoscopy and colonoscopy have been reviewed with the patient.  Questions have been answered.  All parties agreeable.   Lucilla Lame, MD  07/16/2021, 12:05 PM

## 2021-07-16 NOTE — Transfer of Care (Signed)
Immediate Anesthesia Transfer of Care Note  Patient: Leslie [name]  Procedure(s) Performed: COLONOSCOPY WITH PROPOFOL ESOPHAGOGASTRODUODENOSCOPY (EGD)  Patient Location: PACU  Anesthesia Type:MAC  Level of Consciousness: awake, alert  and oriented  Airway & Oxygen Therapy: Patient Spontanous Breathing  Post-op Assessment: Report given to RN and Post -op Vital signs reviewed and stable  Post vital signs: Reviewed and stable  Last Vitals:  Vitals Value Taken Time  BP    Temp    Pulse    Resp    SpO2      Last Pain:  Vitals:   07/16/21 1124  TempSrc: Temporal  PainSc: 0-No pain         Complications: No notable events documented.

## 2021-07-17 LAB — SURGICAL PATHOLOGY

## 2021-07-21 ENCOUNTER — Encounter: Payer: Self-pay | Admitting: Gastroenterology

## 2021-07-22 ENCOUNTER — Ambulatory Visit (INDEPENDENT_AMBULATORY_CARE_PROVIDER_SITE_OTHER): Payer: 59

## 2021-07-22 DIAGNOSIS — I4891 Unspecified atrial fibrillation: Secondary | ICD-10-CM

## 2021-07-22 DIAGNOSIS — I050 Rheumatic mitral stenosis: Secondary | ICD-10-CM

## 2021-07-22 DIAGNOSIS — Z5181 Encounter for therapeutic drug level monitoring: Secondary | ICD-10-CM | POA: Diagnosis not present

## 2021-07-22 LAB — POCT INR: INR: 1.5 — AB (ref 2.0–3.0)

## 2021-07-22 NOTE — Patient Instructions (Signed)
-   TAKE 1 TABLET TODAY and 2 TABLETS THURSDAY ONLY and then  continue dosage warfarin 1 whole tablet daily EXCEPT 1/2 tablet on MONDAYS, WEDNESDAYS & FRIDAYS;  No greens next 3 days - Recheck INR in 3 weeks

## 2021-08-04 ENCOUNTER — Ambulatory Visit (INDEPENDENT_AMBULATORY_CARE_PROVIDER_SITE_OTHER): Payer: BLUE CROSS/BLUE SHIELD

## 2021-08-04 ENCOUNTER — Encounter: Payer: Self-pay | Admitting: Internal Medicine

## 2021-08-04 DIAGNOSIS — I050 Rheumatic mitral stenosis: Secondary | ICD-10-CM | POA: Diagnosis not present

## 2021-08-04 LAB — ECHOCARDIOGRAM COMPLETE
AR max vel: 1.37 cm2
AV Area VTI: 1.63 cm2
AV Area mean vel: 1.6 cm2
AV Mean grad: 6.3 mmHg
AV Peak grad: 14.7 mmHg
Ao pk vel: 1.92 m/s
Area-P 1/2: 3.03 cm2
Calc EF: 61.9 %
MV VTI: 1.36 cm2
S' Lateral: 2.6 cm
Single Plane A2C EF: 59.7 %
Single Plane A4C EF: 64.1 %

## 2021-08-09 ENCOUNTER — Other Ambulatory Visit: Payer: Self-pay | Admitting: Cardiovascular Disease

## 2021-08-09 ENCOUNTER — Other Ambulatory Visit: Payer: Self-pay | Admitting: Internal Medicine

## 2021-08-09 DIAGNOSIS — I050 Rheumatic mitral stenosis: Secondary | ICD-10-CM

## 2021-08-09 DIAGNOSIS — I480 Paroxysmal atrial fibrillation: Secondary | ICD-10-CM

## 2021-08-09 DIAGNOSIS — I5032 Chronic diastolic (congestive) heart failure: Secondary | ICD-10-CM

## 2021-08-09 DIAGNOSIS — Z954 Presence of other heart-valve replacement: Secondary | ICD-10-CM

## 2021-08-10 ENCOUNTER — Ambulatory Visit: Admission: RE | Admit: 2021-08-10 | Payer: BLUE CROSS/BLUE SHIELD | Source: Home / Self Care

## 2021-08-12 ENCOUNTER — Ambulatory Visit (INDEPENDENT_AMBULATORY_CARE_PROVIDER_SITE_OTHER): Payer: BLUE CROSS/BLUE SHIELD

## 2021-08-12 DIAGNOSIS — Z5181 Encounter for therapeutic drug level monitoring: Secondary | ICD-10-CM | POA: Diagnosis not present

## 2021-08-12 DIAGNOSIS — I050 Rheumatic mitral stenosis: Secondary | ICD-10-CM | POA: Diagnosis not present

## 2021-08-12 DIAGNOSIS — I4891 Unspecified atrial fibrillation: Secondary | ICD-10-CM

## 2021-08-12 LAB — POCT INR: INR: 2.7 (ref 2.0–3.0)

## 2021-08-12 NOTE — Patient Instructions (Signed)
continue dosage warfarin 1 whole tablet daily EXCEPT 1/2 tablet on MONDAYS, WEDNESDAYS & FRIDAYS;   - Recheck INR in 6 weeks 

## 2021-08-20 ENCOUNTER — Ambulatory Visit (INDEPENDENT_AMBULATORY_CARE_PROVIDER_SITE_OTHER): Payer: BLUE CROSS/BLUE SHIELD | Admitting: Physician Assistant

## 2021-08-20 ENCOUNTER — Encounter: Payer: Self-pay | Admitting: Physician Assistant

## 2021-08-20 ENCOUNTER — Encounter: Payer: Self-pay | Admitting: Internal Medicine

## 2021-08-20 DIAGNOSIS — Z113 Encounter for screening for infections with a predominantly sexual mode of transmission: Secondary | ICD-10-CM

## 2021-08-20 DIAGNOSIS — R300 Dysuria: Secondary | ICD-10-CM

## 2021-08-20 DIAGNOSIS — N390 Urinary tract infection, site not specified: Secondary | ICD-10-CM | POA: Diagnosis not present

## 2021-08-20 DIAGNOSIS — B3731 Acute candidiasis of vulva and vagina: Secondary | ICD-10-CM | POA: Diagnosis not present

## 2021-08-20 DIAGNOSIS — N200 Calculus of kidney: Secondary | ICD-10-CM

## 2021-08-20 LAB — POCT URINALYSIS DIPSTICK
Bilirubin, UA: NEGATIVE
Glucose, UA: NEGATIVE
Ketones, UA: NEGATIVE
Nitrite, UA: NEGATIVE
Protein, UA: POSITIVE — AB
Spec Grav, UA: 1.015 (ref 1.010–1.025)
Urobilinogen, UA: 0.2 E.U./dL
pH, UA: 6 (ref 5.0–8.0)

## 2021-08-20 MED ORDER — FLUCONAZOLE 150 MG PO TABS: ORAL_TABLET | ORAL | 0 refills | Status: DC

## 2021-08-20 MED ORDER — NITROFURANTOIN MONOHYD MACRO 100 MG PO CAPS: ORAL_CAPSULE | ORAL | 0 refills | Status: DC

## 2021-08-20 NOTE — Progress Notes (Signed)
[facility] [place], [place] [address]  Internal MEDICINE  Office Visit Note  Patient Name: Leslie [name]  [id number]  [id number]  Date of Service: 09/01/2021  Chief Complaint  Patient presents with   Abdominal Pain    Feels bloated, gassy, pain in mid abdomen and goes to right side around to back.  Pt states she has a kidney stone on right side that is 7 mm told from the kidney dr.  Hazel Sams a referral but has to be with [facility] due to Insurance   Vaginal Itching    Having burning, odor and itching in vaginal area.  Not sure if it comes from medication she takes.     Urine frequency     Pt has to urinate a lot and feel that she can't make at times.      HPI Pt is here for sick visit and follow up -She has been having some bloating and lower abdominal pain and urgency and frequency. Some difficulty getting to bathroom in time for the last month. Also having vaginal burning and itching with odor. Has had several yeast infections. Will have pt self-swab today to see if yeast or anything else contributing to vaginal itching and burning. -Does have kidney stone found by nephrology and they want her to be seen by urology and needs this to be with [facility] per her insurance -GI found hernia  Current Medication: Outpatient Encounter Medications as of 08/20/2021  Medication Sig   ALPRAZolam (XANAX) 0.5 MG tablet TAKE 1/2 TABLET BY MOUTH AT BEDTIME AS NEEDED FOR ANXIETY   amLODipine (NORVASC) 10 MG tablet TAKE 1 TABLET(10 MG) BY MOUTH DAILY.   aspirin 81 MG EC tablet Take 81 mg by mouth daily.   atorvastatin (LIPITOR) 80 MG tablet TAKE 1 TABLET BY MOUTH EVERY DAY AT 6 PM   calcium carbonate (TUMS - DOSED IN MG ELEMENTAL CALCIUM) 500 MG chewable tablet Chew 500 mg by mouth daily as needed for indigestion or heartburn.   enoxaparin (LOVENOX) 120 MG/0.8ML injection Inject 0.8 mLs (120 mg total) into the skin every 12 (twelve) hours.   FEROCON capsule TAKE ONE CAPSULE BY  MOUTH TWICE DAILY AFTER MEALS   fluconazole (DIFLUCAN) 150 MG tablet TAKE 1 TABLET(150 MG) BY MOUTH 1 TIME FOR 1 DOSE. MAY TAKE AN ADDITIONAL DOSE AFTER 3 DAYS IF STILL SYMPTOMATIC   nitrofurantoin, macrocrystal-monohydrate, (MACROBID) 100 MG capsule Take 1 cap twice per day for 10 days.   omeprazole (PRILOSEC) 40 MG capsule TAKE ONE CAPSULE BY MOUTH DAILY   ondansetron (ZOFRAN-ODT) 4 MG disintegrating tablet Take 1 tablet (4 mg total) by mouth every 8 (eight) hours as needed for nausea or vomiting.   oxyCODONE-acetaminophen (PERCOCET) 10-325 MG tablet Take 1 tablet by mouth every 6 (six) hours as needed.   spironolactone (ALDACTONE) 25 MG tablet Take 0.5 tablets (12.5 mg total) by mouth daily.   torsemide (DEMADEX) 20 MG tablet TAKE 2 TABLETS BY MOUTH TWICE DAILY   warfarin (COUMADIN) 2 MG tablet Take 1 tablet (2 mg total) by mouth as directed. Take 1/2-1 tablet daily as directed by the anti-coag clnic.   [DISCONTINUED] fluconazole (DIFLUCAN) 150 MG tablet TAKE 1 TABLET(150 MG) BY MOUTH 1 TIME FOR 1 DOSE. MAY TAKE AN ADDITIONAL DOSE AFTER 3 DAYS IF STILL SYMPTOMATIC   [DISCONTINUED] potassium chloride SA (KLOR-CON) 20 MEQ tablet TAKE 1 TABLET(20 MEQ) BY MOUTH TWICE DAILY   metoprolol succinate (TOPROL-XL) 100 MG 24 hr tablet Take 1 tablet (  100 mg total) by mouth 2 (two) times daily. Take with or immediately following a meal.   [DISCONTINUED] amoxicillin-clavulanate (AUGMENTIN) 500-125 MG tablet Take 1 tablet (500 mg total) by mouth 2 (two) times daily. (Patient not taking: Reported on 08/20/2021)   No facility-administered encounter medications on file as of 08/20/2021.    Surgical History: Past Surgical History:  Procedure Laterality Date   ABDOMINAL HYSTERECTOMY     total   ABDOMINAL HYSTERECTOMY     BREAST BIOPSY Bilateral 2012   BREAST BIOPSY Right 08-07-12   fibroadenomatous changes and columnar cells   BREAST BIOPSY  02/03/2015   stereo byrnett   BUBBLE STUDY  04/01/2020   Procedure:  BUBBLE STUDY;  Surgeon: [name], MD;  Location: [place];  Service: Cardiovascular;;   CARDIAC CATHETERIZATION     CHOLECYSTECTOMY N/A 02/27/2016   Procedure: LAPAROSCOPIC CHOLECYSTECTOMY;  Surgeon: Christene Lye, MD;  Location: [facility] ORS;  Service: General;  Laterality: N/A;   CLIPPING OF ATRIAL APPENDAGE  04/29/2020   Procedure: CLIPPING OF ATRIAL APPENDAGE USING ATRICURE  PRO2 CLIP SIZE 45MM;  Surgeon: Rexene Alberts, MD;  Location: [facility] OR;  Service: Open Heart Surgery;;   COLONOSCOPY WITH PROPOFOL N/A 09/26/2018   Procedure: COLONOSCOPY WITH PROPOFOL;  Surgeon: Lucilla Lame, MD;  Location: [facility] ENDOSCOPY;  Service: Endoscopy;  Laterality: N/A;   COLONOSCOPY WITH PROPOFOL N/A 07/16/2021   Procedure: COLONOSCOPY WITH PROPOFOL;  Surgeon: Lucilla Lame, MD;  Location: [facility] ENDOSCOPY;  Service: Endoscopy;  Laterality: N/A;   CYSTOSCOPY W/ RETROGRADES Right 08/18/2018   Procedure: CYSTOSCOPY WITH RETROGRADE PYELOGRAM;  Surgeon: Billey Co, MD;  Location: [facility];  Service: Urology;  Laterality: Right;   CYSTOSCOPY/URETEROSCOPY/HOLMIUM LASER/STENT PLACEMENT Right 08/18/2018   Procedure: CYSTOSCOPY/URETEROSCOPY/STENT PLACEMENT;  Surgeon: Billey Co, MD;  Location: [facility];  Service: Urology;  Laterality: Right;   DIAGNOSTIC LAPAROSCOPY     ESOPHAGOGASTRODUODENOSCOPY N/A 07/16/2021   Procedure: ESOPHAGOGASTRODUODENOSCOPY (EGD);  Surgeon: Lucilla Lame, MD;  Location: [facility] ENDOSCOPY;  Service: Endoscopy;  Laterality: N/A;   ESOPHAGOGASTRODUODENOSCOPY (EGD) WITH PROPOFOL N/A 09/26/2018   Procedure: ESOPHAGOGASTRODUODENOSCOPY (EGD) WITH PROPOFOL;  Surgeon: Lucilla Lame, MD;  Location: [facility] ENDOSCOPY;  Service: Endoscopy;  Laterality: N/A;   GIVENS CAPSULE STUDY N/A 11/03/2018   Procedure: GIVENS CAPSULE STUDY;  Surgeon: Lucilla Lame, MD;  Location: [facility] ENDOSCOPY;  Service: Endoscopy;  Laterality: N/A;   JOINT REPLACEMENT Left    knee   KNEE ARTHROPLASTY Right 04/09/2019    Procedure: COMPUTER ASSISTED TOTAL KNEE ARTHROPLASTY;  Surgeon: Dereck Leep, MD;  Location: [facility] ORS;  Service: Orthopedics;  Laterality: Right;   KNEE CLOSED REDUCTION Left 04/15/2015   Procedure: CLOSED MANIPULATION KNEE;  Surgeon: Thornton Park, MD;  Location: [facility] ORS;  Service: Orthopedics;  Laterality: Left;   KNEE SURGERY     MAZE N/A 04/29/2020   Procedure: MAZE;  Surgeon: Rexene Alberts, MD;  Location: [place];  Service: Open Heart Surgery;  Laterality: N/A;   MITRAL VALVE REPLACEMENT N/A 04/29/2020   Procedure: MITRAL VALVE (MV) REPLACEMENT USING ON-X VALVE SIZE 27/29MM;  Surgeon: Rexene Alberts, MD;  Location: [place];  Service: Open Heart Surgery;  Laterality: N/A;   MULTIPLE EXTRACTIONS WITH ALVEOLOPLASTY N/A 02/28/2020   Procedure: MULTIPLE EXTRACTION WITH ALVEOLOPLASTY;  Surgeon: [name], DMD;  Location: [place];  Service: Dentistry;  Laterality: N/A;   RIGHT/LEFT HEART CATH AND CORONARY ANGIOGRAPHY Bilateral 09/06/2019   Procedure: RIGHT/LEFT HEART CATH AND CORONARY ANGIOGRAPHY;  Surgeon: Minna Merritts, MD;  Location: [place] CV LAB;  Service: Cardiovascular;  Laterality: Bilateral;   TEE WITHOUT CARDIOVERSION N/A 09/13/2017   Procedure: TRANSESOPHAGEAL ECHOCARDIOGRAM (TEE);  Surgeon: Nelva Bush, MD;  Location: [facility];  Service: Cardiovascular;  Laterality: N/A;   TEE WITHOUT CARDIOVERSION N/A 04/01/2020   Procedure: TRANSESOPHAGEAL ECHOCARDIOGRAM (TEE);  Surgeon: [name], MD;  Location: [facility] ENDOSCOPY;  Service: Cardiovascular;  Laterality: N/A;   TEE WITHOUT CARDIOVERSION N/A 04/29/2020   Procedure: TRANSESOPHAGEAL ECHOCARDIOGRAM (TEE);  Surgeon: Rexene Alberts, MD;  Location: [place];  Service: Open Heart Surgery;  Laterality: N/A;   TOTAL KNEE ARTHROPLASTY Left 12/25/2014   Procedure: TOTAL KNEE ARTHROPLASTY;  Surgeon: Thornton Park, MD;  Location: [facility];  Service: Orthopedics;  Laterality: Left;   TUBAL LIGATION      Medical  History: Past Medical History:  Diagnosis Date   (HFpEF) heart failure with preserved ejection fraction ([place])    a. 08/2017 Echo: EF 55-60%.  Grade 2 diastolic dysfunction; b. 07/4079 Echo: EF 50-55%, no rwma, Nl RV fxn, nl fxn'ing mech MV.   Allergy    Anemia    Anxiety    BRCA negative 03/22/2013   Bronchitis 02/19/2016   ON LEVAQUIN PO   Chest tightness    a. 08/2019 Cath: nl cors.   Cigarette smoker 09/11/2017   8-10 day   Dysrhythmia    GERD (gastroesophageal reflux disease)    History of kidney stones    Hyperlipidemia    Hypertension    Interstitial lung disease ([place])    a. CT 2013 b. 02/2018 CXR noted recurrent intersistial changes ILD vs chronic bronchitis   Moderate mitral stenosis    a.  08/2017 TEE: EF 60 to 65%.  Moderate mitral stenosis.  Mean gradient 14 mmHg.  Valve area 2.59 cm by planimetry, 2.72 cm by pressure half-time.   PAF (paroxysmal atrial fibrillation) ([place])    a. 08/2017 s/p TEE/DCCV; b. CHA2DS2VASc = 2-->warfarin.   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 04/2020 s/p 27/29 mm Onyx mech mitral valve-->chronic coumadin; b. 05/2020 Echo: Nl fxn'ing mech MV.    Family History: Family History  Problem Relation Age of Onset   Cancer Mother 48       breast   Hypertension Mother    Cancer Maternal Aunt        breast   Cancer Maternal Grandmother        breast   Osteoarthritis Father    Hypertension Father     Social History   Socioeconomic History   Marital status: Married    Spouse name: Not on file   Number of children: Not on file   Years of education: Not on file   Highest education level: Not on file  Occupational History   Not on file  Tobacco Use   Smoking status: Former    Packs/day: 0.50    Years: 20.00     Total pack years: 10.00    Types: Cigarettes    Quit date: 01/2016    Years since quitting: 5.6   Smokeless tobacco: Never  Vaping Use   Vaping Use: Never used  Substance and Sexual Activity   Alcohol use: Yes    Comment: rarely   Drug use: No   Sexual activity: Not on file  Other Topics Concern   Not on file  Social History Narrative   Not on file   Social Determinants of Health   Financial Resource Strain: Low Risk  (05/07/2020)   Overall Financial Resource Strain (CARDIA)    Difficulty of Paying Living Expenses: Not very hard  Food Insecurity: No Food Insecurity (05/07/2020)   Hunger Vital Sign    Worried About Running Out of Food in the Last Year: Never true    Ran Out of Food in the Last Year: Never true  Transportation Needs: No Transportation Needs (05/07/2020)   PRAPARE - Hydrologist (Medical): No    Lack of Transportation (Non-Medical): No  Physical Activity: Not on file  Stress: Not on file  Social Connections: Not on file  Intimate Partner Violence: Not on file      Review of Systems  Constitutional:  Negative for chills, fatigue and unexpected weight change.  HENT:  Negative for congestion, rhinorrhea, sneezing and sore throat.   Eyes:  Negative for redness.  Respiratory:  Negative for cough, chest tightness and shortness of breath.   Cardiovascular:  Negative for chest pain and palpitations.  Gastrointestinal:  Positive for abdominal pain. Negative for constipation, diarrhea, nausea and vomiting.  Genitourinary:  Positive for dysuria, frequency and urgency. Negative for hematuria.  Musculoskeletal:  Negative for arthralgias, back pain, joint swelling and neck pain.  Skin:  Negative for rash.  Neurological: Negative.  Negative for tremors and numbness.  Hematological:  Negative for adenopathy. Does not bruise/bleed easily.  Psychiatric/Behavioral:  Negative for behavioral problems (Depression), sleep disturbance and suicidal  ideas. The patient is not nervous/anxious.     Vital Signs: BP 138/68   Pulse 78   Temp 98.2 F (36.8 C)   Resp 16   Ht $R'5\' 4"'Ky$  (1.626 m)   Wt 248 lb 12.8 oz (112.9 kg)   SpO2 97%   BMI 42.71 kg/m    Physical Exam Vitals and nursing note reviewed.  Constitutional:      General: She is not in acute distress.    Appearance: Normal appearance. She is well-developed. She is obese. She is not diaphoretic.  HENT:     Head: Normocephalic and atraumatic.     Mouth/Throat:     Pharynx: No oropharyngeal exudate.  Eyes:     Pupils: Pupils are equal, round, and reactive to light.  Neck:     Thyroid: No thyromegaly.     Vascular: No JVD.     Trachea: No tracheal deviation.  Cardiovascular:     Rate and Rhythm: Normal rate and regular rhythm.     Heart sounds: Normal heart sounds. No murmur heard.    No friction rub. No gallop.  Pulmonary:     Effort: Pulmonary effort is normal. No respiratory distress.     Breath sounds: No wheezing or rales.  Chest:     Chest wall: No tenderness.  Abdominal:     General: Bowel sounds are normal.     Palpations: Abdomen is soft.     Tenderness: There is abdominal tenderness. There is no right CVA tenderness or left CVA tenderness.  Musculoskeletal:        General: Normal range of motion.     Cervical back: Normal range of motion and neck supple.  Lymphadenopathy:     Cervical: No cervical adenopathy.  Skin:    General: Skin is warm and dry.  Neurological:     Mental Status: She is alert and  oriented to person, place, and time.     Cranial Nerves: No cranial nerve deficit.  Psychiatric:        Behavior: Behavior normal.        Thought Content: Thought content normal.        Judgment: Judgment normal.        Assessment/Plan: 1. Kidney stone Will refer to urology - CULTURE, URINE COMPREHENSIVE - Ambulatory referral to Urology  2. Vulvovaginal candidiasis - fluconazole (DIFLUCAN) 150 MG tablet; TAKE 1 TABLET(150 MG) BY MOUTH 1 TIME  FOR 1 DOSE. MAY TAKE AN ADDITIONAL DOSE AFTER 3 DAYS IF STILL SYMPTOMATIC  Dispense: 3 tablet; Refill: 0  3. Screen for STD (sexually transmitted disease) Pt self swabbed and will follow up on results accordingly - NuSwab Vaginitis Plus (VG+)  4. Urinary tract infection without hematuria, site unspecified Will start macrobid and adjust based on C/S - CULTURE, URINE COMPREHENSIVE - nitrofurantoin, macrocrystal-monohydrate, (MACROBID) 100 MG capsule; Take 1 cap twice per day for 10 days.  Dispense: 20 capsule; Refill: 0  5. Dysuria - POCT Urinalysis Dipstick   General Counseling: Kona verbalizes understanding of the findings of todays visit and agrees with plan of treatment. I have discussed any further diagnostic evaluation that may be needed or ordered today. We also reviewed her medications today. she has been encouraged to call the office with any questions or concerns that should arise related to todays visit.    Orders Placed This Encounter  Procedures   CULTURE, URINE COMPREHENSIVE   NuSwab Vaginitis Plus (VG+)   Ambulatory referral to Urology   POCT Urinalysis Dipstick    Meds ordered this encounter  Medications   nitrofurantoin, macrocrystal-monohydrate, (MACROBID) 100 MG capsule    Sig: Take 1 cap twice per day for 10 days.    Dispense:  20 capsule    Refill:  0   fluconazole (DIFLUCAN) 150 MG tablet    Sig: TAKE 1 TABLET(150 MG) BY MOUTH 1 TIME FOR 1 DOSE. MAY TAKE AN ADDITIONAL DOSE AFTER 3 DAYS IF STILL SYMPTOMATIC    Dispense:  3 tablet    Refill:  0    This patient was seen by Drema Dallas, PA-C in collaboration with Dr. Clayborn Bigness as a part of collaborative care agreement.   Total time spent:30 Minutes Time spent includes review of chart, medications, test results, and follow up plan with the patient.      Dr Lavera Guise Internal medicine

## 2021-08-25 LAB — NUSWAB VAGINITIS PLUS (VG+)
Candida albicans, NAA: NEGATIVE
Candida glabrata, NAA: NEGATIVE
Chlamydia trachomatis, NAA: NEGATIVE
Neisseria gonorrhoeae, NAA: NEGATIVE
Trich vag by NAA: POSITIVE — AB

## 2021-08-25 LAB — CULTURE, URINE COMPREHENSIVE

## 2021-08-26 ENCOUNTER — Telehealth: Payer: Self-pay

## 2021-08-26 MED ORDER — METRONIDAZOLE 500 MG PO TABS: 500.0000 mg | ORAL_TABLET | Freq: Two times a day (BID) | ORAL | 0 refills | Status: DC

## 2021-08-26 NOTE — Telephone Encounter (Signed)
Spoke to pt about her lab results and informed her the abx is appropriate to treat the UTI.  Also informed pt that she was positive for Trich and we will send in metronidazole 500 mg BID x 7 days.  Also advised we will send in a rx for her partner and that they should not drink alcohol while taking the flagyl.  Pt understood.

## 2021-08-26 NOTE — Telephone Encounter (Signed)
-----   Message from [name], PA-C sent at 08/25/2021  2:36 PM EDT ----- Please let her know that she is on appropriate ABX for her UTI based on culture. She did test positive for trich however and will need to be treated with metronidazole '500mg'$  BID x7. Her partner will also need to be treated and should avoid alcohol while taking this.

## 2021-08-27 ENCOUNTER — Telehealth: Payer: Self-pay

## 2021-08-27 NOTE — Telephone Encounter (Signed)
Urology referral faxed to [facility]

## 2021-08-30 ENCOUNTER — Other Ambulatory Visit: Payer: Self-pay | Admitting: Cardiovascular Disease

## 2021-09-17 ENCOUNTER — Other Ambulatory Visit: Payer: Self-pay | Admitting: Nurse Practitioner

## 2021-09-23 ENCOUNTER — Ambulatory Visit: Payer: BLUE CROSS/BLUE SHIELD | Attending: Cardiovascular Disease

## 2021-09-23 DIAGNOSIS — I050 Rheumatic mitral stenosis: Secondary | ICD-10-CM

## 2021-09-23 DIAGNOSIS — I4891 Unspecified atrial fibrillation: Secondary | ICD-10-CM

## 2021-09-23 DIAGNOSIS — Z5181 Encounter for therapeutic drug level monitoring: Secondary | ICD-10-CM

## 2021-09-23 LAB — POCT INR: INR: 2.6 (ref 2.0–3.0)

## 2021-09-23 NOTE — Patient Instructions (Signed)
continue dosage warfarin 1 whole tablet daily EXCEPT 1/2 tablet on MONDAYS, WEDNESDAYS & FRIDAYS;   - Recheck INR in 6 weeks 

## 2021-10-15 ENCOUNTER — Ambulatory Visit: Payer: BLUE CROSS/BLUE SHIELD | Admitting: Physician Assistant

## 2021-10-16 ENCOUNTER — Ambulatory Visit (INDEPENDENT_AMBULATORY_CARE_PROVIDER_SITE_OTHER): Payer: BLUE CROSS/BLUE SHIELD | Admitting: Physician Assistant

## 2021-10-16 ENCOUNTER — Encounter: Payer: Self-pay | Admitting: Physician Assistant

## 2021-10-16 ENCOUNTER — Telehealth: Payer: Self-pay

## 2021-10-16 ENCOUNTER — Encounter: Payer: Self-pay | Admitting: Cardiology

## 2021-10-16 ENCOUNTER — Ambulatory Visit: Payer: BLUE CROSS/BLUE SHIELD | Attending: Cardiology | Admitting: Cardiology

## 2021-10-16 VITALS — BP 132/60 | Ht 64.0 in | Wt 252.2 lb

## 2021-10-16 VITALS — BP 151/81 | HR 75 | Temp 97.9°F | Resp 16 | Ht 64.0 in | Wt 253.0 lb

## 2021-10-16 DIAGNOSIS — I480 Paroxysmal atrial fibrillation: Secondary | ICD-10-CM | POA: Diagnosis not present

## 2021-10-16 DIAGNOSIS — Z952 Presence of prosthetic heart valve: Secondary | ICD-10-CM

## 2021-10-16 DIAGNOSIS — I4891 Unspecified atrial fibrillation: Secondary | ICD-10-CM | POA: Diagnosis not present

## 2021-10-16 DIAGNOSIS — I5032 Chronic diastolic (congestive) heart failure: Secondary | ICD-10-CM | POA: Diagnosis not present

## 2021-10-16 DIAGNOSIS — R0602 Shortness of breath: Secondary | ICD-10-CM

## 2021-10-16 MED ORDER — METOLAZONE 5 MG PO TABS: 5.0000 mg | ORAL_TABLET | Freq: Every day | ORAL | 3 refills | Status: DC

## 2021-10-16 NOTE — Progress Notes (Signed)
[facility] [place], [place] [address]  Internal MEDICINE  Office Visit Note  Patient Name: Leslie [name]  [id number]  [id number]  Date of Service: 10/16/2021  Chief Complaint  Patient presents with   Edema    Ankle swelling bilaterally - Has not contacted cardiology    Shortness of Breath    Accompanied by ankle swelling   GI Problem    Overall upset stomach     HPI Pt is here for a sick visit. -Has been having some right arm pain, SOB, and ankle swelling the past few weeks. Feels heart fluttering as well. States she has gained some weight and feels more bloated as well and just feels she is retaining more fluid. She is up 5lbs in the last month since last recorded weight by urology office -Has not called cardiology because she thought she needed a new referral, but explained she is established with them and should be able to call when needed. -She has been taking her diuretics as prescribed. Did take an extra tab of torsemide the other day, but hasnt again since and is doing her usual 2tabs BID plus her spironolactone. -She denies CP, but more of a chest discomfort with the SOB. She denies any congestion or wheezing. -Discussed considering going to ED based on symptoms, however patient does have local cardiologist therefore will call their office to see if she can be evaluated as an outpatient today.  -Fortunately, her cardiology office is able to see her today and she would like to move forward with this plan, but will go to ED if any new or worsening symptoms prior.  Current Medication:  Outpatient Encounter Medications as of 10/16/2021  Medication Sig   ALPRAZolam (XANAX) 0.5 MG tablet TAKE 1/2 TABLET BY MOUTH AT BEDTIME AS NEEDED FOR ANXIETY   amLODipine (NORVASC) 10 MG tablet TAKE 1 TABLET(10 MG) BY MOUTH DAILY.   aspirin 81 MG EC tablet Take 81 mg by mouth daily.   atorvastatin (LIPITOR) 80 MG tablet TAKE 1 TABLET BY MOUTH EVERY DAY AT 6 PM    calcium carbonate (TUMS - DOSED IN MG ELEMENTAL CALCIUM) 500 MG chewable tablet Chew 500 mg by mouth daily as needed for indigestion or heartburn.   enoxaparin (LOVENOX) 120 MG/0.8ML injection Inject 0.8 mLs (120 mg total) into the skin every 12 (twelve) hours.   FEROCON capsule TAKE ONE CAPSULE BY MOUTH TWICE DAILY AFTER MEALS   fluconazole (DIFLUCAN) 150 MG tablet TAKE 1 TABLET(150 MG) BY MOUTH 1 TIME FOR 1 DOSE. MAY TAKE AN ADDITIONAL DOSE AFTER 3 DAYS IF STILL SYMPTOMATIC   metoprolol succinate (TOPROL-XL) 100 MG 24 hr tablet TAKE 1 TABLET(100 MG) BY MOUTH TWICE DAILY WITH OR IMMEDIATELY FOLLOWING A MEAL   metroNIDAZOLE (FLAGYL) 500 MG tablet Take 1 tablet (500 mg total) by mouth 2 (two) times daily.   nitrofurantoin, macrocrystal-monohydrate, (MACROBID) 100 MG capsule Take 1 cap twice per day for 10 days.   omeprazole (PRILOSEC) 40 MG capsule TAKE ONE CAPSULE BY MOUTH DAILY   ondansetron (ZOFRAN-ODT) 4 MG disintegrating tablet Take 1 tablet (4 mg total) by mouth every 8 (eight) hours as needed for nausea or vomiting.   oxyCODONE-acetaminophen (PERCOCET) 10-325 MG tablet Take 1 tablet by mouth every 6 (six) hours as needed.   potassium chloride SA (KLOR-CON M) 20 MEQ tablet TAKE 1 TABLET(20 MEQ) BY MOUTH TWICE DAILY   spironolactone (ALDACTONE) 25 MG tablet Take 0.5 tablets (12.5 mg total) by mouth daily.  torsemide (DEMADEX) 20 MG tablet TAKE 2 TABLETS BY MOUTH TWICE DAILY   warfarin (COUMADIN) 2 MG tablet Take 1 tablet (2 mg total) by mouth as directed. Take 1/2-1 tablet daily as directed by the anti-coag clnic.   No facility-administered encounter medications on file as of 10/16/2021.      Medical History: Past Medical History:  Diagnosis Date   (HFpEF) heart failure with preserved ejection fraction ([place])    a. 08/2017 Echo: EF 55-60%.  Grade 2 diastolic dysfunction; b. 0/9604 Echo: EF 50-55%, no rwma, Nl RV fxn, nl fxn'ing mech MV.   Allergy    Anemia    Anxiety    BRCA negative  03/22/2013   Bronchitis 02/19/2016   ON LEVAQUIN PO   Chest tightness    a. 08/2019 Cath: nl cors.   Cigarette smoker 09/11/2017   8-10 day   Dysrhythmia    GERD (gastroesophageal reflux disease)    History of kidney stones    Hyperlipidemia    Hypertension    Interstitial lung disease ([place])    a. CT 2013 b. 02/2018 CXR noted recurrent intersistial changes ILD vs chronic bronchitis   Moderate mitral stenosis    a.  08/2017 TEE: EF 60 to 65%.  Moderate mitral stenosis.  Mean gradient 14 mmHg.  Valve area 2.59 cm by planimetry, 2.72 cm by pressure half-time.   PAF (paroxysmal atrial fibrillation) ([place])    a. 08/2017 s/p TEE/DCCV; b. CHA2DS2VASc = 2-->warfarin.   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 04/2020 s/p 27/29 mm Onyx mech mitral valve-->chronic coumadin; b. 05/2020 Echo: Nl fxn'ing mech MV.     Vital Signs: BP (!) 151/81   Pulse 75   Temp 97.9 F (36.6 C)   Resp 16   Ht '5\' 4"'  (1.626 m)   Wt 253 lb (114.8 kg)   SpO2 97%   BMI 43.43 kg/m    Review of Systems  Constitutional:  Positive for fatigue. Negative for fever.  HENT:  Negative for congestion, mouth sores and postnasal drip.   Respiratory:  Positive for shortness of breath. Negative for cough and wheezing.   Cardiovascular:  Positive for palpitations and leg swelling. Negative for chest pain.  Gastrointestinal:  Positive for abdominal distention. Negative for abdominal pain.  Genitourinary:  Negative for flank pain.  Musculoskeletal:  Positive for arthralgias.  Psychiatric/Behavioral: Negative.      Physical Exam Vitals and nursing note reviewed.  Constitutional:      Appearance: She is well-developed. She is obese.  HENT:     Head: Normocephalic and atraumatic.  Eyes:      Pupils: Pupils are equal, round, and reactive to light.  Cardiovascular:     Rate and Rhythm: Normal rate and regular rhythm.  Pulmonary:     Effort: Pulmonary effort is normal.     Breath sounds: Normal breath sounds. No wheezing.  Abdominal:     Tenderness: There is no abdominal tenderness.  Musculoskeletal:        General: Normal range of motion.     Right lower leg: Edema present.     Left lower leg: Edema present.     Comments: Mild edema bilaterally  Skin:    General: Skin is warm and dry.  Neurological:     General:  No focal deficit present.     Mental Status: She is alert.  Psychiatric:        Mood and Affect: Mood normal.        Behavior: Behavior normal.        Thought Content: Thought content normal.        Judgment: Judgment normal.       Assessment/Plan: 1. Chronic heart failure with preserved ejection fraction [facility]) Patient will have visit with cardiology today for further evaluation due to worsening SOB, weight gain, and heart fluttering. Advised to go to ED if any new or worsening symptoms  2. Paroxysmal atrial fibrillation ([place]) Will see cardiology today  3. SOB (shortness of breath) Will see cardiology today and continue diuretics as prescribed   General Counseling: Xochilth verbalizes understanding of the findings of todays visit and agrees with plan of treatment. I have discussed any further diagnostic evaluation that may be needed or ordered today. We also reviewed her medications today. she has been encouraged to call the office with any questions or concerns that should arise related to todays visit.    Counseling:    No orders of the defined types were placed in this encounter.   No orders of the defined types were placed in this encounter.   Time spent:30 Minutes

## 2021-10-16 NOTE — Telephone Encounter (Signed)
Called patient per Gollan. Patient reports that she is SOB, pain in her right arm, swelling, weight gain, and heart fluttering.  PA from [facility] wanted to discuss with [facility] these symptoms. Gollan reviewed providers note from this AM and wanted patient to come in and be seen.   Offered her the DOD spot today @ 3:20. Patient was agreeable for that appt.

## 2021-10-16 NOTE — Patient Instructions (Signed)
Medication Instructions:   Your physician has recommended you make the following change in your medication:    START taking Metolazone 5 MG:  - Take your first dose today.  -Then take 30 mins prior to your morning dose of Torsemide.  *If you need a refill on your cardiac medications before your next appointment, please call your pharmacy*   Lab Work:  Your physician recommends that you return for lab work (BMP) in:  in 5 days (Tuesday 10/20/21)  - Please go to the [facility]. You will check in at the front desk to the right as you walk into the atrium. Valet Parking is offered if needed. - No appointment needed. You may go any day between 7 am and 6 pm.    Follow-Up: At [facility], you and your health needs are our priority.  As part of our continuing mission to provide you with exceptional heart care, we have created designated Provider Care Teams.  These Care Teams include your primary Cardiologist (physician) and Advanced Practice Providers (APPs -  Physician Assistants and Nurse Practitioners) who all work together to provide you with the care you need, when you need it.  We recommend signing up for the patient portal called "MyChart".  Sign up information is provided on this After Visit Summary.  MyChart is used to connect with patients for Virtual Visits (Telemedicine).  Patients are able to view lab/test results, encounter notes, upcoming appointments, etc.  Non-urgent messages can be sent to your provider as well.   To learn more about what you can do with MyChart, go to [email or web address].    Your next appointment:   2 week(s)  The format for your next appointment:   In Person  Provider:   You may see Ida Rogue, MD or one of the following Advanced Practice Providers on your designated Care Team:   Murray Hodgkins, NP Christell Faith, PA-C Cadence Kathlen Mody, PA-C Gerrie Nordmann, NP    Other Instructions   Important Information About  Sugar

## 2021-10-16 NOTE — Progress Notes (Signed)
Cardiology Office Note:    Date:  10/16/2021   ID:  [name], DOB 1965-06-24, MRN [id number]  PCP:  Carolynne Edouard   [place] Prov[facility] Cardiologist:  Ida Rogue, MD     Referring MD: [name], PA*   Chief Complaint  Patient presents with   Follow-up    Sob, swelling, right arm pain     History of Present Illness:    KELCI PETRELLA is a 56 y.o. female with a hx of HFpEF, atrial fibrillation, mitral stenosis s/p mechanical mitral valve replacement plus MAZE on Coumadin, morbid obesity, former smoker who presents due to shortness of breath.  She states having worsening shortness of breath and leg edema ongoing over the past 2 weeks.  She takes torsemide 40 mg twice daily, she thinks this has not been working, does not have good urine output.  Compliant with all her medications as prescribed, takes Coumadin due to history of mitral valve replacement, mechanical.  Prior notes/studies Echo 07/2021 EF 60 to 65%  Past Medical History:  Diagnosis Date   (HFpEF) heart failure with preserved ejection fraction ([place])    a. 08/2017 Echo: EF 55-60%.  Grade 2 diastolic dysfunction; b. 04/979 Echo: EF 50-55%, no rwma, Nl RV fxn, nl fxn'ing mech MV.   Allergy    Anemia    Anxiety    BRCA negative 03/22/2013   Bronchitis 02/19/2016   ON LEVAQUIN PO   Chest tightness    a. 08/2019 Cath: nl cors.   Cigarette smoker 09/11/2017   8-10 day   Dysrhythmia    GERD (gastroesophageal reflux disease)    History of kidney stones    Hyperlipidemia    Hypertension    Interstitial lung disease ([place])    a. CT 2013 b. 02/2018 CXR noted recurrent intersistial changes ILD vs chronic bronchitis   Moderate mitral stenosis    a.  08/2017 TEE: EF 60 to 65%.  Moderate mitral stenosis.  Mean gradient 14 mmHg.  Valve area 2.59 cm by planimetry, 2.72 cm by pressure half-time.   PAF (paroxysmal atrial fibrillation) ([place])    a. 08/2017 s/p TEE/DCCV; b. CHA2DS2VASc =  2-->warfarin.   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 04/2020 s/p 27/29 mm Onyx mech mitral valve-->chronic coumadin; b. 05/2020 Echo: Nl fxn'ing mech MV.    Past Surgical History:  Procedure Laterality Date   ABDOMINAL HYSTERECTOMY     total   ABDOMINAL HYSTERECTOMY     BREAST BIOPSY Bilateral 2012   BREAST BIOPSY Right 08-07-12   fibroadenomatous changes and columnar cells   BREAST BIOPSY  02/03/2015   stereo byrnett   BUBBLE STUDY  04/01/2020   Procedure: BUBBLE STUDY;  Surgeon: [name], MD;  Location: [place];  Service: Cardiovascular;;   CARDIAC CATHETERIZATION     CHOLECYSTECTOMY N/A 02/27/2016   Procedure: LAPAROSCOPIC CHOLECYSTECTOMY;  Surgeon: Christene Lye, MD;  Location: [facility] ORS;  Service: General;  Laterality: N/A;   CLIPPING OF ATRIAL APPENDAGE  04/29/2020   Procedure: CLIPPING OF ATRIAL APPENDAGE USING ATRICURE  PRO2 CLIP SIZE 45MM;  Surgeon: Rexene Alberts, MD;  Location: [name] OR;  Service: Open Heart Surgery;;   COLONOSCOPY WITH PROPOFOL N/A 09/26/2018   Procedure: COLONOSCOPY WITH PROPOFOL;  Surgeon:  Lucilla Lame, MD;  Location: [place] ENDOSCOPY;  Service: Endoscopy;  Laterality: N/A;   COLONOSCOPY WITH PROPOFOL N/A 07/16/2021   Procedure: COLONOSCOPY WITH PROPOFOL;  Surgeon: Lucilla Lame, MD;  Location: [facility] ENDOSCOPY;  Service: Endoscopy;  Laterality: N/A;   CYSTOSCOPY W/ RETROGRADES Right 08/18/2018   Procedure: CYSTOSCOPY WITH RETROGRADE PYELOGRAM;  Surgeon: Billey Co, MD;  Location: [facility] ORS;  Service: Urology;  Laterality: Right;   CYSTOSCOPY/URETEROSCOPY/HOLMIUM LASER/STENT PLACEMENT Right 08/18/2018   Procedure: CYSTOSCOPY/URETEROSCOPY/STENT PLACEMENT;  Surgeon: Billey Co, MD;  Location: [facility] ORS;   Service: Urology;  Laterality: Right;   DIAGNOSTIC LAPAROSCOPY     ESOPHAGOGASTRODUODENOSCOPY N/A 07/16/2021   Procedure: ESOPHAGOGASTRODUODENOSCOPY (EGD);  Surgeon: Lucilla Lame, MD;  Location: [facility] ENDOSCOPY;  Service: Endoscopy;  Laterality: N/A;   ESOPHAGOGASTRODUODENOSCOPY (EGD) WITH PROPOFOL N/A 09/26/2018   Procedure: ESOPHAGOGASTRODUODENOSCOPY (EGD) WITH PROPOFOL;  Surgeon: Lucilla Lame, MD;  Location: [facility] ENDOSCOPY;  Service: Endoscopy;  Laterality: N/A;   GIVENS CAPSULE STUDY N/A 11/03/2018   Procedure: GIVENS CAPSULE STUDY;  Surgeon: Lucilla Lame, MD;  Location: [facility] ENDOSCOPY;  Service: Endoscopy;  Laterality: N/A;   JOINT REPLACEMENT Left    knee   KNEE ARTHROPLASTY Right 04/09/2019   Procedure: COMPUTER ASSISTED TOTAL KNEE ARTHROPLASTY;  Surgeon: Dereck Leep, MD;  Location: [facility] ORS;  Service: Orthopedics;  Laterality: Right;   KNEE CLOSED REDUCTION Left 04/15/2015   Procedure: CLOSED MANIPULATION KNEE;  Surgeon: Thornton Park, MD;  Location: [facility] ORS;  Service: Orthopedics;  Laterality: Left;   KNEE SURGERY     MAZE N/A 04/29/2020   Procedure: MAZE;  Surgeon: Rexene Alberts, MD;  Location: [place];  Service: Open Heart Surgery;  Laterality: N/A;   MITRAL VALVE REPLACEMENT N/A 04/29/2020   Procedure: MITRAL VALVE (MV) REPLACEMENT USING ON-X VALVE SIZE 27/29MM;  Surgeon: Rexene Alberts, MD;  Location: [place];  Service: Open Heart Surgery;  Laterality: N/A;   MULTIPLE EXTRACTIONS WITH ALVEOLOPLASTY N/A 02/28/2020   Procedure: MULTIPLE EXTRACTION WITH ALVEOLOPLASTY;  Surgeon: [name], DMD;  Location: [place];  Service: Dentistry;  Laterality: N/A;   RIGHT/LEFT HEART CATH AND CORONARY ANGIOGRAPHY Bilateral 09/06/2019   Procedure: RIGHT/LEFT HEART CATH AND CORONARY ANGIOGRAPHY;  Surgeon: Minna Merritts, MD;  Location: [place] CV LAB;  Service: Cardiovascular;  Laterality: Bilateral;   TEE WITHOUT CARDIOVERSION N/A 09/13/2017   Procedure: TRANSESOPHAGEAL ECHOCARDIOGRAM  (TEE);  Surgeon: Nelva Bush, MD;  Location: [facility] ORS;  Service: Cardiovascular;  Laterality: N/A;   TEE WITHOUT CARDIOVERSION N/A 04/01/2020   Procedure: TRANSESOPHAGEAL ECHOCARDIOGRAM (TEE);  Surgeon: [name], MD;  Location: [facility] ENDOSCOPY;  Service: Cardiovascular;  Laterality: N/A;   TEE WITHOUT CARDIOVERSION N/A 04/29/2020   Procedure: TRANSESOPHAGEAL ECHOCARDIOGRAM (TEE);  Surgeon: Rexene Alberts, MD;  Location: [place];  Service: Open Heart Surgery;  Laterality: N/A;   TOTAL KNEE ARTHROPLASTY Left 12/25/2014   Procedure: TOTAL KNEE ARTHROPLASTY;  Surgeon: Thornton Park, MD;  Location: [facility] ORS;  Service: Orthopedics;  Laterality: Left;   TUBAL LIGATION      Current Medications: Current Meds  Medication Sig   ALPRAZolam (XANAX) 0.5 MG tablet TAKE 1/2 TABLET BY MOUTH AT BEDTIME AS NEEDED FOR ANXIETY   amLODipine (NORVASC) 10 MG tablet TAKE 1 TABLET(10 MG) BY MOUTH DAILY.   aspirin 81 MG EC tablet Take 81 mg by mouth daily.   atorvastatin (LIPITOR) 80 MG tablet TAKE 1 TABLET BY MOUTH EVERY DAY AT 6 PM   calcium carbonate (TUMS - DOSED IN MG ELEMENTAL CALCIUM)  500 MG chewable tablet Chew 500 mg by mouth daily as needed for indigestion or heartburn.   FEROCON capsule TAKE ONE CAPSULE BY MOUTH TWICE DAILY AFTER MEALS   fluconazole (DIFLUCAN) 150 MG tablet TAKE 1 TABLET(150 MG) BY MOUTH 1 TIME FOR 1 DOSE. MAY TAKE AN ADDITIONAL DOSE AFTER 3 DAYS IF STILL SYMPTOMATIC   metolazone (ZAROXOLYN) 5 MG tablet Take 1 tablet (5 mg total) by mouth daily. Take 30 mins prior to your morning Torsemide dose.   metoprolol succinate (TOPROL-XL) 100 MG 24 hr tablet TAKE 1 TABLET(100 MG) BY MOUTH TWICE DAILY WITH OR IMMEDIATELY FOLLOWING A MEAL   omeprazole (PRILOSEC) 40 MG capsule TAKE ONE CAPSULE BY MOUTH DAILY   potassium chloride SA (KLOR-CON M) 20 MEQ tablet TAKE 1 TABLET(20 MEQ) BY MOUTH TWICE DAILY   spironolactone (ALDACTONE) 25 MG tablet Take 0.5 tablets (12.5 mg total) by mouth daily.    torsemide (DEMADEX) 20 MG tablet TAKE 2 TABLETS BY MOUTH TWICE DAILY   warfarin (COUMADIN) 2 MG tablet Take 1 tablet (2 mg total) by mouth as directed. Take 1/2-1 tablet daily as directed by the anti-coag clnic.     Allergies:   Morphine, Oxycodone hcl, and Dilaudid [hydromorphone hcl]   Social History   Socioeconomic History   Marital status: Married    Spouse name: Not on file   Number of children: Not on file   Years of education: Not on file   Highest education level: Not on file  Occupational History   Not on file  Tobacco Use   Smoking status: Former    Packs/day: 0.50    Years: 20.00    Total pack years: 10.00    Types: Cigarettes    Quit date: 01/2016    Years since quitting: 5.7   Smokeless tobacco: Never  Vaping Use   Vaping Use: Never used  Substance and Sexual Activity   Alcohol use: Yes    Comment: rarely   Drug use: No   Sexual activity: Not on file  Other Topics Concern   Not on file  Social History Narrative   Not on file   Social Determinants of Health   Financial Resource Strain: Low Risk  (05/07/2020)   Overall Financial Resource Strain (CARDIA)    Difficulty of Paying Living Expenses: Not very hard  Food Insecurity: No Food Insecurity (05/07/2020)   Hunger Vital Sign    Worried About Running Out of Food in the Last Year: Never true    Ran Out of Food in the Last Year: Never true  Transportation Needs: No Transportation Needs (05/07/2020)   PRAPARE - Hydrologist (Medical): No    Lack of Transportation (Non-Medical): No  Physical Activity: Not on file  Stress: Not on file  Social Connections: Not on file     Family History: The patient's family history includes Cancer in her maternal aunt and maternal grandmother; Cancer (age of onset: 54) in her mother; Hypertension in her father and mother; Osteoarthritis in her father.  ROS:   Please see the history of present illness.     All other systems reviewed and are  negative.  EKGs/Labs/Other Studies Reviewed:    The following studies were reviewed today:   EKG:  EKG is  ordered today.  The ekg ordered today demonstrates sinus bradycardia, heart rate 56, otherwise normal ECG  Recent Labs: 06/13/2021: ALT 38; BUN 22; Creatinine, Ser 1.64; Hemoglobin 12.6; Platelets 295; Potassium 3.6; Sodium 137 06/26/2021: TSH  0.658  Recent Lipid Panel    Component Value Date/Time   CHOL 170 06/26/2021 0912   TRIG 160 (H) 06/26/2021 0912   HDL 36 (L) 06/26/2021 0912   CHOLHDL 5.2 09/19/2018 0932   VLDL 37 09/19/2018 0932   LDLCALC 106 (H) 06/26/2021 0912     Risk Assessment/Calculations:             Physical Exam:    VS:  BP 132/60 (BP Location: Left Arm, Patient Position: Sitting, Cuff Size: Large)   Ht 5' 4" (1.626 m)   Wt 252 lb 3.2 oz (114.4 kg)   SpO2 97%   BMI 43.29 kg/m     Wt Readings from Last 3 Encounters:  10/16/21 252 lb 3.2 oz (114.4 kg)  10/16/21 253 lb (114.8 kg)  08/20/21 248 lb 12.8 oz (112.9 kg)     GEN:  Well nourished, well developed in no acute distress HEENT: Normal NECK: No JVD; No carotid bruits CARDIAC: RRR, no murmurs, rubs, gallops RESPIRATORY: Diminished breath sounds at bases ABDOMEN: Soft, non-tender, non-distended MUSCULOSKELETAL:  2+ pitting edema; No deformity  SKIN: Warm and dry NEUROLOGIC:  Alert and oriented x 3 PSYCHIATRIC:  Normal affect   ASSESSMENT:    1. Chronic heart failure with preserved ejection fraction ([place])   2. Atrial fibrillation, unspecified type ([place])   3. H/O mitral valve replacement with mechanical valve   4. Morbid obesity ([place])    PLAN:    In order of problems listed above:  HFpEF, appears volume overloaded with leg edema.  Not having good urine output on current dose of torsemide.  Start metolazone 5 mg daily, continue torsemide 40 mg twice daily, Aldactone.  Check BMP in 5 days.  If adequate diuresing/improvement in symptoms not met, recommend patient go to ED. Paroxysmal  A-fib s/p Maze procedure, maintaining sinus rhythm.  Continue Toprol-XL, warfarin. History of mechanical mitral valve replacement, continue Coumadin. Morbid obesity, low-calorie diet, weight loss advised.    Follow-up in 1 to 2 weeks with APP or Dr. Rockey Situ.  Medication Adjustments/Labs and Tests Ordered: Current medicines are reviewed at length with the patient today.  Concerns regarding medicines are outlined above.  Orders Placed This Encounter  Procedures   Basic metabolic panel   EKG [id number]   Meds ordered this encounter  Medications   metolazone (ZAROXOLYN) 5 MG tablet    Sig: Take 1 tablet (5 mg total) by mouth daily. Take 30 mins prior to your morning Torsemide dose.    Dispense:  30 tablet    Refill:  3    Patient Instructions  Medication Instructions:   Your physician has recommended you make the following change in your medication:    START taking Metolazone 5 MG:  - Take your first dose today.  -Then take 30 mins prior to your morning dose of Torsemide.  *If you need a refill on your cardiac medications before your next appointment, please call your pharmacy*   Lab Work:  Your physician recommends that you return for lab work (BMP) in:  in 5 days (Tuesday 10/20/21)  - Please go to the [facility]. You will check in at the front desk to the right as you walk into the atrium. Valet Parking is offered if needed. - No appointment needed. You may go any day between 7 am and 6 pm.    Follow-Up: At [facility], you and your health needs are our priority.  As part of our continuing mission to provide  you with exceptional heart care, we have created designated Provider Care Teams.  These Care Teams include your primary Cardiologist (physician) and Advanced Practice Providers (APPs -  Physician Assistants and Nurse Practitioners) who all work together to provide you with the care you need, when you need it.  We recommend signing up for the patient  portal called "MyChart".  Sign up information is provided on this After Visit Summary.  MyChart is used to connect with patients for Virtual Visits (Telemedicine).  Patients are able to view lab/test results, encounter notes, upcoming appointments, etc.  Non-urgent messages can be sent to your provider as well.   To learn more about what you can do with MyChart, go to [email or web address].    Your next appointment:   2 week(s)  The format for your next appointment:   In Person  Provider:   You may see Ida Rogue, MD or one of the following Advanced Practice Providers on your designated Care Team:   Murray Hodgkins, NP Christell Faith, PA-C Cadence Kathlen Mody, PA-C Gerrie Nordmann, NP    Other Instructions   Important Information About Sugar         Signed, Kate Sable, MD  10/16/2021 4:56 PM    [place]

## 2021-10-19 ENCOUNTER — Ambulatory Visit: Payer: BLUE CROSS/BLUE SHIELD | Admitting: Physician Assistant

## 2021-10-20 ENCOUNTER — Other Ambulatory Visit
Admission: RE | Admit: 2021-10-20 | Discharge: 2021-10-20 | Disposition: A | Discharge status: Home / Self Care | Payer: BLUE CROSS/BLUE SHIELD | Attending: Cardiology | Admitting: Cardiology

## 2021-10-20 DIAGNOSIS — I5032 Chronic diastolic (congestive) heart failure: Secondary | ICD-10-CM | POA: Insufficient documentation

## 2021-10-20 LAB — BASIC METABOLIC PANEL
Anion gap: 17 — ABNORMAL HIGH (ref 5–15)
BUN: 59 mg/dL — ABNORMAL HIGH (ref 6–20)
CO2: 31 mmol/L (ref 22–32)
Calcium: 10.9 mg/dL — ABNORMAL HIGH (ref 8.9–10.3)
Chloride: 82 mmol/L — ABNORMAL LOW (ref 98–111)
Creatinine, Ser: 2.66 mg/dL — ABNORMAL HIGH (ref 0.44–1.00)
GFR, Estimated: 21 mL/min — ABNORMAL LOW (ref >60)
Glucose, Bld: 139 mg/dL — ABNORMAL HIGH (ref 70–99)
Potassium: 3.1 mmol/L — ABNORMAL LOW (ref 3.5–5.1)
Sodium: 130 mmol/L — ABNORMAL LOW (ref 135–145)

## 2021-10-21 ENCOUNTER — Telehealth: Payer: Self-pay

## 2021-10-21 ENCOUNTER — Ambulatory Visit: Payer: 59 | Admitting: Gastroenterology

## 2021-10-21 NOTE — Telephone Encounter (Signed)
Received the following secure chat from Dr. Garen Lah.

## 2021-10-21 NOTE — Telephone Encounter (Signed)
I called patient and advised her to Hold Metolazone and repeat BMP in 5 days (Mon. 10/26/21), as advised by Dr. Garen Lah below. The patient stated that she was feeling "dizzy, light headed, and dry" today. I advised her to take one extra tablet of her K+ 20 MEQ and drink a gatorade. Patient will call us tomorrow if she is still not feeling well.

## 2021-10-26 ENCOUNTER — Other Ambulatory Visit
Admission: RE | Admit: 2021-10-26 | Discharge: 2021-10-26 | Disposition: A | Discharge status: Home / Self Care | Payer: BLUE CROSS/BLUE SHIELD | Attending: Cardiology | Admitting: Cardiology

## 2021-10-26 ENCOUNTER — Telehealth: Payer: Self-pay

## 2021-10-26 ENCOUNTER — Telehealth: Payer: Self-pay | Admitting: Cardiology

## 2021-10-26 DIAGNOSIS — I10 Essential (primary) hypertension: Secondary | ICD-10-CM | POA: Insufficient documentation

## 2021-10-26 DIAGNOSIS — I5032 Chronic diastolic (congestive) heart failure: Secondary | ICD-10-CM

## 2021-10-26 DIAGNOSIS — I509 Heart failure, unspecified: Secondary | ICD-10-CM | POA: Insufficient documentation

## 2021-10-26 DIAGNOSIS — I5030 Unspecified diastolic (congestive) heart failure: Secondary | ICD-10-CM

## 2021-10-26 LAB — BASIC METABOLIC PANEL
Anion gap: 13 (ref 5–15)
BUN: 80 mg/dL — ABNORMAL HIGH (ref 6–20)
CO2: 30 mmol/L (ref 22–32)
Calcium: 9.7 mg/dL (ref 8.9–10.3)
Chloride: 94 mmol/L — ABNORMAL LOW (ref 98–111)
Creatinine, Ser: 3.18 mg/dL — ABNORMAL HIGH (ref 0.44–1.00)
GFR, Estimated: 17 mL/min — ABNORMAL LOW (ref >60)
Glucose, Bld: 121 mg/dL — ABNORMAL HIGH (ref 70–99)
Potassium: 3.2 mmol/L — ABNORMAL LOW (ref 3.5–5.1)
Sodium: 137 mmol/L (ref 135–145)

## 2021-10-26 MED ORDER — POTASSIUM CHLORIDE CRYS ER 20 MEQ PO TBCR: EXTENDED_RELEASE_TABLET | ORAL | 3 refills | Status: DC

## 2021-10-26 NOTE — Telephone Encounter (Signed)
Pt would like a callback on whether she needs to have repeat labs done before appt on Thursday. Please advise

## 2021-10-26 NOTE — Telephone Encounter (Signed)
Spoke w/ pt and advised her to that she is due for BMET today. She states that she went to have them drawn, but there was no order. Advised her that I am placing order for BMET and she can go back today at her convenience.  She is appreciative of the call.

## 2021-10-26 NOTE — Telephone Encounter (Signed)
Discussed result with Dr. Garen Lah. He recommended patient increase their K+ to 40 MEQ in the AM and 20 MEQ in the evening, continue to hold Metolazone, and re-check BMP in 1 week. Patient called, verbalized understanding and agrees with plan.

## 2021-10-27 ENCOUNTER — Telehealth: Payer: Self-pay | Admitting: Internal Medicine

## 2021-10-27 NOTE — Telephone Encounter (Signed)
Patient called requesting CT abdomen results. I explained to her that she will need to request from Dr. Kerman Passey, who ordered it-Toni

## 2021-10-28 NOTE — Progress Notes (Signed)
Cardiology Clinic Note   Patient Name: Leslie [name] Date of Encounter: 10/29/2021  Primary Care Provider:  [name], PA-C Primary Cardiologist:  Ida Rogue, MD  Patient Profile    56 year old female with a history of HFpEF, atrial fibrillation, mitral stenosis status post mechanical mitral valve replacement plus MAZE N, morbid obesity, former smoker who presents for follow-up after recent visit for shortness of breath.  Past Medical History    Past Medical History:  Diagnosis Date   (HFpEF) heart failure with preserved ejection fraction ([place])    a. 08/2017 Echo: EF 55-60%.  Grade 2 diastolic dysfunction; b. 07/5168 Echo: EF 50-55%, no rwma, Nl RV fxn, nl fxn'ing mech MV.   Allergy    Anemia    Anxiety    BRCA negative 03/22/2013   Bronchitis 02/19/2016   ON LEVAQUIN PO   Chest tightness    a. 08/2019 Cath: nl cors.   Cigarette smoker 09/11/2017   8-10 day   Dysrhythmia    GERD (gastroesophageal reflux disease)    History of kidney stones    Hyperlipidemia    Hypertension    Interstitial lung disease ([place])    a. CT 2013 b. 02/2018 CXR noted recurrent intersistial changes ILD vs chronic bronchitis   Moderate mitral stenosis    a.  08/2017 TEE: EF 60 to 65%.  Moderate mitral stenosis.  Mean gradient 14 mmHg.  Valve area 2.59 cm by planimetry, 2.72 cm by pressure half-time.   PAF (paroxysmal atrial fibrillation) ([place])    a. 08/2017 s/p TEE/DCCV; b. CHA2DS2VASc = 2-->warfarin.   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 04/2020 s/p 27/29 mm Onyx mech mitral valve-->chronic coumadin; b. 05/2020 Echo: Nl fxn'ing mech MV.   Past Surgical History:  Procedure Laterality Date   ABDOMINAL HYSTERECTOMY     total   ABDOMINAL  HYSTERECTOMY     BREAST BIOPSY Bilateral 2012   BREAST BIOPSY Right 08-07-12   fibroadenomatous changes and columnar cells   BREAST BIOPSY  02/03/2015   stereo byrnett   BUBBLE STUDY  04/01/2020   Procedure: BUBBLE STUDY;  Surgeon: [name], MD;  Location: [place];  Service: Cardiovascular;;   CARDIAC CATHETERIZATION     CHOLECYSTECTOMY N/A 02/27/2016   Procedure: LAPAROSCOPIC CHOLECYSTECTOMY;  Surgeon: Christene Lye, MD;  Location: [facility] ORS;  Service: General;  Laterality: N/A;   CLIPPING OF ATRIAL APPENDAGE  04/29/2020   Procedure: CLIPPING OF ATRIAL APPENDAGE USING ATRICURE  PRO2 CLIP SIZE 45MM;  Surgeon: Rexene Alberts, MD;  Location: [facility] OR;  Service: Open Heart Surgery;;   COLONOSCOPY WITH PROPOFOL N/A 09/26/2018   Procedure: COLONOSCOPY WITH PROPOFOL;  Surgeon: Lucilla Lame, MD;  Location: [facility] ENDOSCOPY;  Service: Endoscopy;  Laterality: N/A;   COLONOSCOPY WITH PROPOFOL N/A 07/16/2021   Procedure: COLONOSCOPY WITH PROPOFOL;  Surgeon: Lucilla Lame, MD;  Location: [facility] ENDOSCOPY;  Service: Endoscopy;  Laterality: N/A;   CYSTOSCOPY W/ RETROGRADES Right 08/18/2018   Procedure: CYSTOSCOPY WITH RETROGRADE PYELOGRAM;  Surgeon: Billey Co, MD;  Location: [facility]S;  Service: Urology;  Laterality: Right;   CYSTOSCOPY/URETEROSCOPY/HOLMIUM LASER/STENT PLACEMENT Right 08/18/2018   Procedure: CYSTOSCOPY/URETEROSCOPY/STENT PLACEMENT;  Surgeon: Billey Co, MD;  Location: [facility]S;  Service: Urology;  Laterality: Right;   DIAGNOSTIC  LAPAROSCOPY     ESOPHAGOGASTRODUODENOSCOPY N/A 07/16/2021   Procedure: ESOPHAGOGASTRODUODENOSCOPY (EGD);  Surgeon: Lucilla Lame, MD;  Location: [facility] ENDOSCOPY;  Service: Endoscopy;  Laterality: N/A;   ESOPHAGOGASTRODUODENOSCOPY (EGD) WITH PROPOFOL N/A 09/26/2018   Procedure: ESOPHAGOGASTRODUODENOSCOPY (EGD) WITH PROPOFOL;  Surgeon: Lucilla Lame, MD;  Location: [facility] ENDOSCOPY;  Service: Endoscopy;  Laterality: N/A;   GIVENS CAPSULE STUDY N/A  11/03/2018   Procedure: GIVENS CAPSULE STUDY;  Surgeon: Lucilla Lame, MD;  Location: [facility] ENDOSCOPY;  Service: Endoscopy;  Laterality: N/A;   JOINT REPLACEMENT Left    knee   KNEE ARTHROPLASTY Right 04/09/2019   Procedure: COMPUTER ASSISTED TOTAL KNEE ARTHROPLASTY;  Surgeon: Dereck Leep, MD;  Location: [facility] ORS;  Service: Orthopedics;  Laterality: Right;   KNEE CLOSED REDUCTION Left 04/15/2015   Procedure: CLOSED MANIPULATION KNEE;  Surgeon: Thornton Park, MD;  Location: [facility] ORS;  Service: Orthopedics;  Laterality: Left;   KNEE SURGERY     MAZE N/A 04/29/2020   Procedure: MAZE;  Surgeon: Rexene Alberts, MD;  Location: [place];  Service: Open Heart Surgery;  Laterality: N/A;   MITRAL VALVE REPLACEMENT N/A 04/29/2020   Procedure: MITRAL VALVE (MV) REPLACEMENT USING ON-X VALVE SIZE 27/29MM;  Surgeon: Rexene Alberts, MD;  Location: [place];  Service: Open Heart Surgery;  Laterality: N/A;   MULTIPLE EXTRACTIONS WITH ALVEOLOPLASTY N/A 02/28/2020   Procedure: MULTIPLE EXTRACTION WITH ALVEOLOPLASTY;  Surgeon: [name], DMD;  Location: [place];  Service: Dentistry;  Laterality: N/A;   RIGHT/LEFT HEART CATH AND CORONARY ANGIOGRAPHY Bilateral 09/06/2019   Procedure: RIGHT/LEFT HEART CATH AND CORONARY ANGIOGRAPHY;  Surgeon: Minna Merritts, MD;  Location: [place] [facility];  Service: Cardiovascular;  Laterality: Bilateral;   TEE WITHOUT CARDIOVERSION N/A 09/13/2017   Procedure: TRANSESOPHAGEAL ECHOCARDIOGRAM (TEE);  Surgeon: Nelva Bush, MD;  Location: [facility];  Service: Cardiovascular;  Laterality: N/A;   TEE WITHOUT CARDIOVERSION N/A 04/01/2020   Procedure: TRANSESOPHAGEAL ECHOCARDIOGRAM (TEE);  Surgeon: [name], MD;  Location: [facility] ENDOSCOPY;  Service: Cardiovascular;  Laterality: N/A;   TEE WITHOUT CARDIOVERSION N/A 04/29/2020   Procedure: TRANSESOPHAGEAL ECHOCARDIOGRAM (TEE);  Surgeon: Rexene Alberts, MD;  Location: [place];  Service: Open Heart Surgery;  Laterality: N/A;    TOTAL KNEE ARTHROPLASTY Left 12/25/2014   Procedure: TOTAL KNEE ARTHROPLASTY;  Surgeon: Thornton Park, MD;  Location: [facility];  Service: Orthopedics;  Laterality: Left;   TUBAL LIGATION      Allergies  Allergies  Allergen Reactions   Morphine Hives and Rash   Oxycodone Hcl Hives and Itching   Dilaudid [Hydromorphone Hcl] Itching    History of Present Illness    Leslie [name] is a 56 year old female with a history of HFpEF, atrial fibrillation, mitral stenosis status post mechanical mitral valve replacement plus MAZE on chronic anticoagulation with significant medical therapy, morbid obesity, and former smoker.  Patient was hospitalized 08/29/2017 for chest tightness episode.  New onset atrial fibrillation with RVR.  She underwent a TEE which showed moderate mitral stenosis and was cardioverted.  During office visit in 12/2017 noted tachypalpitations with heart rate in 130s, EKG showed sinus rhythm.  Coronary CTA was performed 02/2018 due to chest tightness that showed nonobstructive CAD.  She had progressive mitral stenosis on serial echoes with severe mitral stenosis on echo in 06/2019.  Patient was referred to Dr. 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  She was found.  Disease that required multiple teeth extracted to prep for  surgery on 02/28/2020.  TEE in 03/2018 to confirm severe rheumatic MS.  Dr. Roxy Manns performed mitral valve replacement with On-X bileaflet mechanical valve with Maze procedure on 04/29/2020.  She was started on Coumadin due to mechanical MVR.  Follow-up echo 06/12/2020 showed EF 50-55% with no MV regurgitation.  She had left upper extremity ulnar nerve surgery with Dr. Apolonio Schneiders on 8/15.  She required Lovenox bridging perioperatively.  Unfortunately, postop she experienced significant bleeding and required to be restrained with 1 unit of PRBCs for hemoglobin of 7.7.  She was last seen in clinic 10/16/2021 by Dr.  Garen Lah for shortness of breath, swelling, and right arm pain.  She was started on metolazone 5 mg daily continue with the torsemide 40 mg daily and recheck a BMP in 5 days.  Unfortunately on her BMP her potassium was decreased was recommended she increase her potassium to 40 mEq daily and 20 mEq in the evening and to hold metolazone we will recheck a BMP in 1 week.  She returns to clinic today feeling very well.  She states that she had to call EMS yesterday with palpitations, worsening shortness of breath with associated symptoms of nausea, diaphoresis and the worst case of heartburn she states that she has ever had.  She had pain to her right arm that radiated down that arm.  She states that she has been having worsening headaches and some shaking.  She also endorses some back pain and states that her PCP had recently sent her for CT and she was told that she had a kidney stone and will need to follow-up with urology next week.   Home Medications    Current Outpatient Medications  Medication Sig Dispense Refill   ALPRAZolam (XANAX) 0.5 MG tablet TAKE 1/2 TABLET BY MOUTH AT BEDTIME AS NEEDED FOR ANXIETY 30 tablet 1   amLODipine (NORVASC) 10 MG tablet TAKE 1 TABLET(10 MG) BY MOUTH DAILY. 90 tablet 1   aspirin 81 MG EC tablet Take 81 mg by mouth daily.     atorvastatin (LIPITOR) 80 MG tablet TAKE 1 TABLET BY MOUTH EVERY DAY AT 6 PM 90 tablet 1   calcium carbonate (TUMS - DOSED IN MG ELEMENTAL CALCIUM) 500 MG chewable tablet Chew 500 mg by mouth daily as needed for indigestion or heartburn.     FEROCON capsule TAKE ONE CAPSULE BY MOUTH TWICE DAILY AFTER MEALS 180 capsule 1   fluconazole (DIFLUCAN) 150 MG tablet TAKE 1 TABLET(150 MG) BY MOUTH 1 TIME FOR 1 DOSE. MAY TAKE AN ADDITIONAL DOSE AFTER 3 DAYS IF STILL SYMPTOMATIC 3 tablet 0   metolazone (ZAROXOLYN) 5 MG tablet Take 1 tablet (5 mg total) by mouth daily. Take 30 mins prior to your morning Torsemide dose. 30 tablet 3   metoprolol succinate  (TOPROL-XL) 100 MG 24 hr tablet TAKE 1 TABLET(100 MG) BY MOUTH TWICE DAILY WITH OR IMMEDIATELY FOLLOWING A MEAL 180 tablet 2   omeprazole (PRILOSEC) 40 MG capsule TAKE ONE CAPSULE BY MOUTH DAILY 90 capsule 3   potassium chloride SA (KLOR-CON M) 20 MEQ tablet Take 40 MEQ (2 tabs) every AM, Take 20 MEQ (1 tab) every PM 90 tablet 3   spironolactone (ALDACTONE) 25 MG tablet Take 0.5 tablets (12.5 mg total) by mouth daily. 30 tablet 7   torsemide (DEMADEX) 20 MG tablet TAKE 2 TABLETS BY MOUTH TWICE DAILY 360 tablet 2   warfarin (COUMADIN) 2 MG tablet Take 1 tablet (2 mg total) by mouth as directed. Take 1/2-1 tablet daily  as directed by the anti-coag clnic. 90 tablet 1   enoxaparin (LOVENOX) 120 MG/0.8ML injection Inject 0.8 mLs (120 mg total) into the skin every 12 (twelve) hours. (Patient not taking: Reported on 10/16/2021) 16 mL 1   metroNIDAZOLE (FLAGYL) 500 MG tablet Take 1 tablet (500 mg total) by mouth 2 (two) times daily. (Patient not taking: Reported on 10/16/2021) 14 tablet 0   nitrofurantoin, macrocrystal-monohydrate, (MACROBID) 100 MG capsule Take 1 cap twice per day for 10 days. (Patient not taking: Reported on 10/16/2021) 20 capsule 0   ondansetron (ZOFRAN-ODT) 4 MG disintegrating tablet Take 1 tablet (4 mg total) by mouth every 8 (eight) hours as needed for nausea or vomiting. (Patient not taking: Reported on 10/16/2021) 20 tablet 0   oxyCODONE-acetaminophen (PERCOCET) 10-325 MG tablet Take 1 tablet by mouth every 6 (six) hours as needed. (Patient not taking: Reported on 10/16/2021)     No current facility-administered medications for this visit.     Family History    Family History  Problem Relation Age of Onset   Cancer Mother 45       breast   Hypertension Mother    Cancer Maternal Aunt        breast   Cancer Maternal Grandmother        breast   Osteoarthritis Father    Hypertension Father    She indicated that her mother is deceased. She indicated that her father is alive. She  indicated that the status of her maternal grandmother is unknown. She indicated that the status of her maternal aunt is unknown.  Social History    Social History   Socioeconomic History   Marital status: Married    Spouse name: Not on file   Number of children: Not on file   Years of education: Not on file   Highest education level: Not on file  Occupational History   Not on file  Tobacco Use   Smoking status: Former    Packs/day: 0.50    Years: 20.00    Total pack years: 10.00    Types: Cigarettes    Quit date: 01/2016    Years since quitting: 5.7   Smokeless tobacco: Never  Vaping Use   Vaping Use: Never used  Substance and Sexual Activity   Alcohol use: Yes    Comment: rarely   Drug use: No   Sexual activity: Not on file  Other Topics Concern   Not on file  Social History Narrative   Not on file   Social Determinants of Health   Financial Resource Strain: Low Risk  (05/07/2020)   Overall Financial Resource Strain (CARDIA)    Difficulty of Paying Living Expenses: Not very hard  Food Insecurity: No Food Insecurity (05/07/2020)   Hunger Vital Sign    Worried About Running Out of Food in the Last Year: Never true    Ran Out of Food in the Last Year: Never true  Transportation Needs: No Transportation Needs (05/07/2020)   PRAPARE - Hydrologist (Medical): No    Lack of Transportation (Non-Medical): No  Physical Activity: Not on file  Stress: Not on file  Social Connections: Not on file  Intimate Partner Violence: Not on file     Review of Systems    General:  No chills, fever, night sweats or weight changes.  Endorses fatigue Cardiovascular:  No chest pain, endorses worse dyspnea on exertion, endorses edema, orthopnea, palpitations, but denies paroxysmal nocturnal dyspnea. Dermatological: No rash,  lesions/masses Respiratory: No cough, endorses dyspnea Urologic: No hematuria, dysuria Abdominal:   No nausea, vomiting, diarrhea, bright  red blood per rectum, melena, or hematemesis Musculoskeletal: Endorses lower back pain Neurologic:  No visual changes, wkns, changes in mental status.  Endorses headache All other systems reviewed and are otherwise negative except as noted above.   Physical Exam    VS:  BP (!) 148/72 (BP Location: Left Arm, Patient Position: Sitting, Cuff Size: Large)   Pulse (!) 59   Ht _0  (1.626 m)   Wt 249 lb (112.9 kg)   SpO2 98%   BMI 42.74 kg/m  , BMI Body mass index is 42.74 kg/m.     GEN: Well nourished, well developed, in no acute distress. HEENT: normal.  Glasses facemask on Neck: Supple, no JVD, carotid bruits, or masses. Cardiac: RRR, no murmurs distant heart tones, rubs, or gallops. No clubbing, cyanosis, trace pretibial edema.  Radials/DP/PT 2+ and equal bilaterally.  Respiratory:  Respirations regular and unlabored, diminished to auscultation bilaterally. GI: Soft, nontender, nondistended, BS + x 4.  Positive for CVA tenderness on the right MS: no deformity or atrophy. Skin: warm and dry, no rash. Neuro:  Strength and sensation are intact. Psych: Normal affect.  Accessory Clinical Findings    ECG personally reviewed by me today-sinus bradycardia with a rate of 59- No acute changes  Lab Results  Component Value Date   WBC 7.2 06/13/2021   HGB 12.6 06/13/2021   HCT 39.5 06/13/2021   MCV 89.8 06/13/2021   PLT 295 06/13/2021   Lab Results  Component Value Date   CREATININE 3.18 (H) 10/26/2021   BUN 80 (H) 10/26/2021   NA 137 10/26/2021   K 3.2 (L) 10/26/2021   CL 94 (L) 10/26/2021   CO2 30 10/26/2021   Lab Results  Component Value Date   ALT 38 06/13/2021   AST 37 06/13/2021   ALKPHOS 127 (H) 06/13/2021   BILITOT 0.7 06/13/2021   Lab Results  Component Value Date   CHOL 170 06/26/2021   HDL 36 (L) 06/26/2021   LDLCALC 106 (H) 06/26/2021   TRIG 160 (H) 06/26/2021   CHOLHDL 5.2 09/19/2018    Lab Results  Component Value Date   HGBA1C 6.5 (H) 06/18/2021     Assessment & Plan   1.  HFpEF which appears to be volume up with shortness of breath, edema, and two-pillow orthopnea.  Echocardiogram with LVEF 60-65%, no regional wall motion abnormalities, mildly elevated pulmonary artery systolic pressure, mitral valve replacement with no evidence of regurgitation or stenosis.  She was previously evaluated for shortness of breath and peripheral edema.  Metolazone 5 mg daily.  BMP showed low potassium and elevated creatinine.  Metolazone was subsequently discontinued and she was continued on torsemide, Aldactone, and Toprol-XL.  When questioned about her fluid intake she states that she drinks approximately 8 bottles of water a day and maybe 1-2 bottles of Gatorade per day.  Patient was advised to decrease the amount of fluid that she has his intake as this contributes to her swelling and her shortness of breath.  She also states that she has decreased urine output and was told by her PCP after a CT of her abdomen pelvis that she had a kidney stone that needed to be followed up on by urology next week.  2.  Paroxysmal atrial fibrillation status post maze procedure who is currently maintaining sinus rhythm on EKG completed today in office.  She is continued on Toprol-XL  100 mg daily and warfarin.  With concern for palpitations patient is experiencing that she called EMS for after the acute issues that she is having today is taking care of we will place her back on a ZIO XT monitor.  3.  Status post mitral valve replacement for moderate to severe mitral stenosis with an Onyx bileaflet mechanical valve that was stable on last echocardiogram.  She is continued on chronic Coumadin therapy  4.  Hypertension blood pressure 148/60 recheck is 148/72.  Patient states blood pressure has been elevated as of late.  She is continued on amlodipine 10 mg daily, Toprol-XL 100 mg daily Aldactone 12.5 mg daily and torsemide 40 mg twice daily.  5.  Chronic kidney disease stage IIIb  with worsening creatinine with GFR at 17 on last labs.  Since she has continued worsening kidney function even though she has been adequately hydrating drinking the 2.5 to 3 L that urology recommended previously.  6.  Hypokalemia with potassium levels of 3.2 after having increased amount of potassium, 40 mill equivalents in a.m. and 20 mill equivalents in the p.m. for the last several weeks.  7.  Nephrolithiasis that has been followed by urology at [facility] with continued pain and discomfort to the right back with patient continuing to complain of kidney pain today and inability to empty the bladder.  Stating that her urine output has been lower than usual.  Unfortunately she did have follow-up she stated next week about the kidney stone.  But with concerns for having palpitations, elevated blood pressure, continued back pain, decreased amount of urine output, elevated serum creatinine with GFR dropping to 17 , chills, and shaking, and the concern for infection versus hydronephrosis.  8.  Disposition patient is being sent to the emergency department for further evaluation.  She will follow-up in clinic with MD/APP posthospital visit in 1 to 2 weeks.  Oniyah Rohe, NP 10/29/2021, 3:01 PM

## 2021-10-29 ENCOUNTER — Encounter: Payer: Self-pay | Admitting: Cardiology

## 2021-10-29 ENCOUNTER — Other Ambulatory Visit: Payer: Self-pay

## 2021-10-29 ENCOUNTER — Ambulatory Visit: Payer: BLUE CROSS/BLUE SHIELD | Attending: Cardiology | Admitting: Cardiology

## 2021-10-29 ENCOUNTER — Emergency Department
Admission: EM | Admit: 2021-10-29 | Discharge: 2021-10-29 | Disposition: A | Discharge status: Home / Self Care | Payer: BLUE CROSS/BLUE SHIELD | Attending: Emergency Medicine | Admitting: Emergency Medicine

## 2021-10-29 ENCOUNTER — Emergency Department: Payer: BLUE CROSS/BLUE SHIELD

## 2021-10-29 VITALS — BP 148/72 | HR 59 | Ht 64.0 in | Wt 249.0 lb

## 2021-10-29 DIAGNOSIS — N189 Chronic kidney disease, unspecified: Secondary | ICD-10-CM | POA: Insufficient documentation

## 2021-10-29 DIAGNOSIS — R0789 Other chest pain: Secondary | ICD-10-CM

## 2021-10-29 DIAGNOSIS — K2900 Acute gastritis without bleeding: Secondary | ICD-10-CM | POA: Diagnosis not present

## 2021-10-29 DIAGNOSIS — I509 Heart failure, unspecified: Secondary | ICD-10-CM | POA: Insufficient documentation

## 2021-10-29 DIAGNOSIS — T50905A Adverse effect of unspecified drugs, medicaments and biological substances, initial encounter: Secondary | ICD-10-CM

## 2021-10-29 DIAGNOSIS — I10 Essential (primary) hypertension: Secondary | ICD-10-CM | POA: Diagnosis not present

## 2021-10-29 DIAGNOSIS — R1013 Epigastric pain: Secondary | ICD-10-CM | POA: Diagnosis present

## 2021-10-29 DIAGNOSIS — Z954 Presence of other heart-valve replacement: Secondary | ICD-10-CM

## 2021-10-29 DIAGNOSIS — T502X5A Adverse effect of carbonic-anhydrase inhibitors, benzothiadiazides and other diuretics, initial encounter: Secondary | ICD-10-CM | POA: Insufficient documentation

## 2021-10-29 DIAGNOSIS — I130 Hypertensive heart and chronic kidney disease with heart failure and stage 1 through stage 4 chronic kidney disease, or unspecified chronic kidney disease: Secondary | ICD-10-CM

## 2021-10-29 DIAGNOSIS — I480 Paroxysmal atrial fibrillation: Secondary | ICD-10-CM | POA: Diagnosis not present

## 2021-10-29 DIAGNOSIS — N200 Calculus of kidney: Secondary | ICD-10-CM

## 2021-10-29 LAB — COMPREHENSIVE METABOLIC PANEL
ALT: 24 U/L (ref 0–44)
AST: 30 U/L (ref 15–41)
Albumin: 4.3 g/dL (ref 3.5–5.0)
Alkaline Phosphatase: 108 U/L (ref 38–126)
Anion gap: 10 (ref 5–15)
BUN: 45 mg/dL — ABNORMAL HIGH (ref 6–20)
CO2: 30 mmol/L (ref 22–32)
Calcium: 9.3 mg/dL (ref 8.9–10.3)
Chloride: 100 mmol/L (ref 98–111)
Creatinine, Ser: 2.18 mg/dL — ABNORMAL HIGH (ref 0.44–1.00)
GFR, Estimated: 26 mL/min — ABNORMAL LOW (ref >60)
Glucose, Bld: 97 mg/dL (ref 70–99)
Potassium: 3.2 mmol/L — ABNORMAL LOW (ref 3.5–5.1)
Sodium: 140 mmol/L (ref 135–145)
Total Bilirubin: 0.7 mg/dL (ref 0.3–1.2)
Total Protein: 8.3 g/dL — ABNORMAL HIGH (ref 6.5–8.1)

## 2021-10-29 LAB — CBC
HCT: 36.3 % (ref 36.0–46.0)
Hemoglobin: 11.8 g/dL — ABNORMAL LOW (ref 12.0–15.0)
MCH: 28.8 pg (ref 26.0–34.0)
MCHC: 32.5 g/dL (ref 30.0–36.0)
MCV: 88.5 fL (ref 80.0–100.0)
Platelets: 322 10*3/uL (ref 150–400)
RBC: 4.1 MIL/uL (ref 3.87–5.11)
RDW: 12.2 % (ref 11.5–15.5)
WBC: 6.6 10*3/uL (ref 4.0–10.5)
nRBC: 0 % (ref 0.0–0.2)

## 2021-10-29 LAB — TROPONIN I (HIGH SENSITIVITY): Troponin I (High Sensitivity): 11 ng/L (ref <18)

## 2021-10-29 LAB — LIPASE, BLOOD: Lipase: 27 U/L (ref 11–51)

## 2021-10-29 LAB — BRAIN NATRIURETIC PEPTIDE: B Natriuretic Peptide: 211.4 pg/mL — ABNORMAL HIGH (ref 0.0–100.0)

## 2021-10-29 MED ORDER — ALUM & MAG HYDROXIDE-SIMETH 200-200-20 MG/5ML PO SUSP
30.0000 mL | Freq: Once | ORAL | Status: AC
  Administered 2021-10-29: 30 mL via ORAL
  Filled 2021-10-29: qty 30

## 2021-10-29 MED ORDER — ONDANSETRON 4 MG PO TBDP
4.0000 mg | ORAL_TABLET | Freq: Once | ORAL | Status: AC
  Administered 2021-10-29: 4 mg via ORAL
  Filled 2021-10-29: qty 1

## 2021-10-29 MED ORDER — SUCRALFATE 1 G PO TABS: 1.0000 g | ORAL_TABLET | Freq: Three times a day (TID) | ORAL | 0 refills | Status: DC

## 2021-10-29 MED ORDER — ONDANSETRON 4 MG PO TBDP: 4.0000 mg | ORAL_TABLET | Freq: Three times a day (TID) | ORAL | 0 refills | Status: DC | PRN

## 2021-10-29 MED ORDER — AZITHROMYCIN 250 MG PO TABS: ORAL_TABLET | ORAL | 0 refills | Status: DC

## 2021-10-29 MED ORDER — POTASSIUM CHLORIDE ER 10 MEQ PO CPCR: ORAL_CAPSULE | ORAL | 0 refills | Status: DC

## 2021-10-29 NOTE — ED Provider Notes (Signed)
[facility] Provider Note    Event Date/Time   First MD Initiated Contact with Patient 10/29/21 1802     (approximate)   History   Abdominal Pain, Back Pain, Nausea, and Arm Pain   HPI  Leslie [name] is a 56 y.o. female with extensive past medical history including history of heart failure with preserved EF, CKD, GERD, prior mitral valve replacement, CHF, here with multiple complaints.  The patient's primary complaint is nausea, epigastric and mild substernal chest and abdominal pain, cough, some chest discomfort, as well as right and left shoulder discomfort.  The abdominal discomfort has been worsening over the last week.  She describes it as a bloating and aching pain in her epigastric area.  It is worse with eating.  Of note, it correlates with increasing her potassium supplements which were increased in the setting of taking metolazone.  She also had stopped taking her metolazone because her creatinine had gone up.  She states that she does not feel like she has much fluid on her.  Her leg swelling has actually improved.  She has had a mild cough, but also had some chills and nasal congestion and has sick contacts in the home.  She told her cardiologist about the symptoms today and was sent in for evaluation given her chest discomfort as well as abdominal discomfort.  She has had no vomiting.  No other complaints.     Physical Exam   Triage Vital Signs: ED Triage Vitals  Enc Vitals Group     BP 10/29/21 1527 135/76     Pulse Rate 10/29/21 1527 (!) 59     Resp 10/29/21 1527 16     Temp 10/29/21 1527 98.4 F (36.9 C)     Temp Source 10/29/21 1527 Oral     SpO2 10/29/21 1527 94 %     Weight 10/29/21 1528 249 lb (112.9 kg)     Height 10/29/21 1800 '5\' 4"'$  (1.626 m)     Head Circumference --      Peak Flow --      Pain Score 10/29/21 1528 6     Pain Loc --      Pain Edu? --      Excl. in [place]? --     Most recent vital signs: Vitals:   10/29/21 1527   BP: 135/76  Pulse: (!) 59  Resp: 16  Temp: 98.4 F (36.9 C)  SpO2: 94%     General: Awake, no distress.  CV:  Good peripheral perfusion.  Regular rate and rhythm. Resp:  Normal effort.  Coarse breath sounds but normal aeration bilaterally.  No rales. Abd:  No distention.  Minimal epigastric tenderness.  No rebound or guarding. Other:  No lower extremity edema.   ED Results / Procedures / Treatments   Labs (all labs ordered are listed, but only abnormal results are displayed) Labs Reviewed  COMPREHENSIVE METABOLIC PANEL - Abnormal; Notable for the following components:      Result Value   Potassium 3.2 (*)    BUN 45 (*)    Creatinine, Ser 2.18 (*)    Total Protein 8.3 (*)    GFR, Estimated 26 (*)    All other components within normal limits  CBC - Abnormal; Notable for the following components:   Hemoglobin 11.8 (*)    All other components within normal limits  BRAIN NATRIURETIC PEPTIDE - Abnormal; Notable for the following components:   B Natriuretic Peptide 211.4 (*)  All other components within normal limits  LIPASE, BLOOD  URINALYSIS, ROUTINE W REFLEX MICROSCOPIC  TROPONIN I (HIGH SENSITIVITY)     EKG Bradycardia, ventricular rate 59.  PR 174, QRS 94, QTc 429.  No acute ST elevations or depressions.  No acute events of acute ischemia or infarct.   RADIOLOGY Chest x-ray: Mild diffuse interstitial opacities suggesting inflammatory process, normal cardiac size   I also independently reviewed and agree with radiologist interpretations.   PROCEDURES:  Critical Care performed: No     MEDICATIONS ORDERED IN ED: Medications  alum & mag hydroxide-simeth (MAALOX/MYLANTA) 200-200-20 MG/5ML suspension 30 mL (30 mLs Oral Given 10/29/21 1902)  ondansetron (ZOFRAN-ODT) disintegrating tablet 4 mg (4 mg Oral Given 10/29/21 1902)     IMPRESSION / MDM / ASSESSMENT AND PLAN / ED COURSE  I reviewed the triage vital signs and the nursing notes.                                The patient is on the cardiac monitor to evaluate for evidence of arrhythmia and/or significant heart rate changes.   Ddx:  Differential includes the following, with pertinent life- or limb-threatening emergencies considered:  Gastritis/GI upset in the setting of increased potassium supplementation, atypical ACS, pneumonia, atelectasis, PE, unlikely dissection, enteritis, obstruction, medication effect  Patient's presentation is most consistent with acute presentation with potential threat to life or bodily function.  MDM:  56 year old female with past medical history as above here with multiple complaints.  Regarding her abdominal discomfort this correlates very directly with increasing her potassium supplements per her cardiologist.  Her abdomen is soft, nontender, nondistended on my examination.  She has no leukocytosis.  She has normal LFTs and renal function.  She is status postcholecystectomy.  We will have her start Carafate and decrease her potassium dose to 10 mEq more frequently.  Also encouraged to take these with food.  Zofran given for nausea.  Regarding her shortness of breath, this seems to be a somewhat chronic issue.  She clinically does not appear hypervolemic and she has been having her diuretics very closely managed by her cardiologist.  Chest x-ray shows possible bronchitis and she does have sick contacts at home.  Chest x-ray shows no pneumonia.  Will give azithromycin, advised her to continue her regular cardiology care, and follow-up as an outpatient.  No EKG evidence or clinical evidence to suggest ischemia.  Troponin negative despite constant symptoms for well over 24 hours.   MEDICATIONS GIVEN IN ED: Medications  alum & mag hydroxide-simeth (MAALOX/MYLANTA) 200-200-20 MG/5ML suspension 30 mL (30 mLs Oral Given 10/29/21 1902)  ondansetron (ZOFRAN-ODT) disintegrating tablet 4 mg (4 mg Oral Given 10/29/21 1902)     Consults:     EMR reviewed  Prior  cardiology and PCP notes     FINAL CLINICAL IMPRESSION(S) / ED DIAGNOSES   Final diagnoses:  Atypical chest pain  Acute superficial gastritis without hemorrhage  Adverse effect of drug, initial encounter     Rx / DC Orders   ED Discharge Orders          Ordered    sucralfate (CARAFATE) 1 g tablet  3 times daily with meals & bedtime        10/29/21 1933    azithromycin (ZITHROMAX Z-PAK) 250 MG tablet        10/29/21 1933    ondansetron (ZOFRAN-ODT) 4 MG disintegrating tablet  Every 8 hours PRN  10/29/21 1933    potassium chloride (MICRO-K) 10 MEQ CR capsule        10/29/21 1933             Note:  This document was prepared using Dragon voice recognition software and may include unintentional dictation errors.   Duffy Bruce, MD 10/29/21 [phone]

## 2021-10-29 NOTE — Patient Instructions (Signed)
Your physician recommends you to go to the ED.   Medication Instructions:   Your physician recommends that you continue on your current medications as directed. Please refer to the Current Medication list given to you today.  *If you need a refill on your cardiac medications before your next appointment, please call your pharmacy*   Lab Work:  None Ordered  If you have labs (blood work) drawn today and your tests are completely normal, you will receive your results only by: [place] (if you have MyChart) OR A paper copy in the mail If you have any lab test that is abnormal or we need to change your treatment, we will call you to review the results.   Testing/Procedures:  None Ordered   Follow-Up: At [facility], you and your health needs are our priority.  As part of our continuing mission to provide you with exceptional heart care, we have created designated Provider Care Teams.  These Care Teams include your primary Cardiologist (physician) and Advanced Practice Providers (APPs -  Physician Assistants and Nurse Practitioners) who all work together to provide you with the care you need, when you need it.  We recommend signing up for the patient portal called "MyChart".  Sign up information is provided on this After Visit Summary.  MyChart is used to connect with patients for Virtual Visits (Telemedicine).  Patients are able to view lab/test results, encounter notes, upcoming appointments, etc.  Non-urgent messages can be sent to your provider as well.   To learn more about what you can do with MyChart, go to [email or web address].

## 2021-10-29 NOTE — ED Provider Triage Note (Signed)
Emergency Medicine Provider Triage Evaluation Note  Leslie [name], a 56 y.o. female  was evaluated in triage.  Pt complains of presents to the ED with multiple complaints, including 2 weeks of generalized fatigue, chest pain, abdominal pain, and back pain.  Patient also reports some fluid retention.  She presents to the ED from heart care clinic today for evaluation of her symptoms.  Review of Systems  Positive: CP, SOB, fluid retention, Back pain Negative: FCS  Physical Exam  BP 135/76   Pulse (!) 59   Temp 98.4 F (36.9 C) (Oral)   Resp 16   Wt 112.9 kg   SpO2 94%   BMI 42.74 kg/m  Gen:   Awake, no distress  NAD Resp:  Normal effort CTA MSK:   Moves extremities without difficulty  Other:    Medical Decision Making  Medically screening exam initiated at 3:35 PM.  Appropriate orders placed.  Leslie [name] was informed that the remainder of the evaluation will be completed by another provider, this initial triage assessment does not replace that evaluation, and the importance of remaining in the ED until their evaluation is complete.  Patient to the ED for evaluation of several weeks of symptoms including shortness of breath, chest pain, fluid retention.  Patient is a heart valve replacement patient with a history of heart failure.   Melvenia Needles, PA-C 10/29/21 1536

## 2021-10-29 NOTE — ED Triage Notes (Signed)
Pt coming from heart care today- PT had valvle replacement or in 2022. Pt thought they were having a heart attack yesterday with pain that resolved.Today patient arrived with abdominal pain, back pain, R arm pain and headache starting a few weeks ago.

## 2021-11-01 ENCOUNTER — Other Ambulatory Visit: Payer: Self-pay | Admitting: Internal Medicine

## 2021-11-02 NOTE — Telephone Encounter (Signed)
Refill request

## 2021-11-03 ENCOUNTER — Telehealth: Payer: Self-pay

## 2021-11-03 ENCOUNTER — Other Ambulatory Visit: Payer: Self-pay

## 2021-11-03 MED ORDER — ALBUTEROL SULFATE HFA 108 (90 BASE) MCG/ACT IN AERS: 2.0000 | INHALATION_SPRAY | Freq: Four times a day (QID) | RESPIRATORY_TRACT | 0 refills | Status: DC | PRN

## 2021-11-03 NOTE — Telephone Encounter (Signed)
Pt called that she need refills for albuterol it was in D/c list as per lauren send to phar

## 2021-11-04 ENCOUNTER — Ambulatory Visit: Payer: BLUE CROSS/BLUE SHIELD | Attending: Cardiovascular Disease

## 2021-11-04 DIAGNOSIS — I4891 Unspecified atrial fibrillation: Secondary | ICD-10-CM | POA: Diagnosis not present

## 2021-11-04 DIAGNOSIS — I050 Rheumatic mitral stenosis: Secondary | ICD-10-CM | POA: Diagnosis not present

## 2021-11-04 DIAGNOSIS — Z5181 Encounter for therapeutic drug level monitoring: Secondary | ICD-10-CM

## 2021-11-04 LAB — POCT INR: INR: 2.6 (ref 2.0–3.0)

## 2021-11-04 NOTE — Patient Instructions (Signed)
continue dosage warfarin 1 whole tablet daily EXCEPT 1/2 tablet on MONDAYS, WEDNESDAYS & FRIDAYS;   - Recheck INR in 6 weeks 

## 2021-11-11 ENCOUNTER — Encounter (HOSPITAL_COMMUNITY): Payer: Self-pay

## 2021-11-11 ENCOUNTER — Inpatient Hospital Stay (HOSPITAL_COMMUNITY)
Admission: EM | Admit: 2021-11-11 | Discharge: 2021-11-16 | DRG: 683 | Disposition: A | Discharge status: Home / Self Care | Payer: BLUE CROSS/BLUE SHIELD | Attending: Family Medicine | Admitting: Family Medicine

## 2021-11-11 ENCOUNTER — Emergency Department (HOSPITAL_COMMUNITY): Payer: BLUE CROSS/BLUE SHIELD

## 2021-11-11 ENCOUNTER — Other Ambulatory Visit: Payer: Self-pay

## 2021-11-11 DIAGNOSIS — N200 Calculus of kidney: Secondary | ICD-10-CM | POA: Diagnosis present

## 2021-11-11 DIAGNOSIS — Z23 Encounter for immunization: Secondary | ICD-10-CM

## 2021-11-11 DIAGNOSIS — N1832 Chronic kidney disease, stage 3b: Secondary | ICD-10-CM | POA: Diagnosis present

## 2021-11-11 DIAGNOSIS — Z885 Allergy status to narcotic agent status: Secondary | ICD-10-CM

## 2021-11-11 DIAGNOSIS — R109 Unspecified abdominal pain: Secondary | ICD-10-CM

## 2021-11-11 DIAGNOSIS — I5032 Chronic diastolic (congestive) heart failure: Secondary | ICD-10-CM | POA: Diagnosis present

## 2021-11-11 DIAGNOSIS — R9431 Abnormal electrocardiogram [ECG] [EKG]: Secondary | ICD-10-CM | POA: Diagnosis present

## 2021-11-11 DIAGNOSIS — N179 Acute kidney failure, unspecified: Secondary | ICD-10-CM

## 2021-11-11 DIAGNOSIS — I4821 Permanent atrial fibrillation: Secondary | ICD-10-CM | POA: Diagnosis present

## 2021-11-11 DIAGNOSIS — I480 Paroxysmal atrial fibrillation: Secondary | ICD-10-CM | POA: Diagnosis present

## 2021-11-11 DIAGNOSIS — J849 Interstitial pulmonary disease, unspecified: Secondary | ICD-10-CM | POA: Diagnosis present

## 2021-11-11 DIAGNOSIS — Z6841 Body Mass Index (BMI) 40.0 and over, adult: Secondary | ICD-10-CM

## 2021-11-11 DIAGNOSIS — E861 Hypovolemia: Secondary | ICD-10-CM | POA: Diagnosis present

## 2021-11-11 DIAGNOSIS — N170 Acute kidney failure with tubular necrosis: Secondary | ICD-10-CM | POA: Diagnosis not present

## 2021-11-11 DIAGNOSIS — T502X5A Adverse effect of carbonic-anhydrase inhibitors, benzothiadiazides and other diuretics, initial encounter: Secondary | ICD-10-CM | POA: Diagnosis present

## 2021-11-11 DIAGNOSIS — E876 Hypokalemia: Secondary | ICD-10-CM | POA: Diagnosis present

## 2021-11-11 DIAGNOSIS — I130 Hypertensive heart and chronic kidney disease with heart failure and stage 1 through stage 4 chronic kidney disease, or unspecified chronic kidney disease: Secondary | ICD-10-CM | POA: Diagnosis present

## 2021-11-11 DIAGNOSIS — Z8249 Family history of ischemic heart disease and other diseases of the circulatory system: Secondary | ICD-10-CM

## 2021-11-11 DIAGNOSIS — E871 Hypo-osmolality and hyponatremia: Secondary | ICD-10-CM | POA: Diagnosis not present

## 2021-11-11 DIAGNOSIS — I050 Rheumatic mitral stenosis: Secondary | ICD-10-CM

## 2021-11-11 DIAGNOSIS — I10 Essential (primary) hypertension: Secondary | ICD-10-CM | POA: Diagnosis present

## 2021-11-11 DIAGNOSIS — Z803 Family history of malignant neoplasm of breast: Secondary | ICD-10-CM

## 2021-11-11 DIAGNOSIS — Z7982 Long term (current) use of aspirin: Secondary | ICD-10-CM

## 2021-11-11 DIAGNOSIS — Z952 Presence of prosthetic heart valve: Secondary | ICD-10-CM

## 2021-11-11 DIAGNOSIS — E785 Hyperlipidemia, unspecified: Secondary | ICD-10-CM | POA: Diagnosis present

## 2021-11-11 DIAGNOSIS — Z87891 Personal history of nicotine dependence: Secondary | ICD-10-CM

## 2021-11-11 DIAGNOSIS — Z954 Presence of other heart-valve replacement: Secondary | ICD-10-CM

## 2021-11-11 DIAGNOSIS — Z7901 Long term (current) use of anticoagulants: Secondary | ICD-10-CM

## 2021-11-11 DIAGNOSIS — K219 Gastro-esophageal reflux disease without esophagitis: Secondary | ICD-10-CM | POA: Diagnosis present

## 2021-11-11 DIAGNOSIS — Z79899 Other long term (current) drug therapy: Secondary | ICD-10-CM

## 2021-11-11 LAB — URINALYSIS, ROUTINE W REFLEX MICROSCOPIC
Bilirubin Urine: NEGATIVE
Glucose, UA: NEGATIVE mg/dL
Hgb urine dipstick: NEGATIVE
Ketones, ur: NEGATIVE mg/dL
Leukocytes,Ua: NEGATIVE
Nitrite: NEGATIVE
Protein, ur: NEGATIVE mg/dL
Specific Gravity, Urine: 1.008 (ref 1.005–1.030)
pH: 7 (ref 5.0–8.0)

## 2021-11-11 LAB — COMPREHENSIVE METABOLIC PANEL
ALT: 38 U/L (ref 0–44)
AST: 43 U/L — ABNORMAL HIGH (ref 15–41)
Albumin: 4.6 g/dL (ref 3.5–5.0)
Alkaline Phosphatase: 127 U/L — ABNORMAL HIGH (ref 38–126)
Anion gap: 16 — ABNORMAL HIGH (ref 5–15)
BUN: 85 mg/dL — ABNORMAL HIGH (ref 6–20)
CO2: 34 mmol/L — ABNORMAL HIGH (ref 22–32)
Calcium: 9.9 mg/dL (ref 8.9–10.3)
Chloride: 83 mmol/L — ABNORMAL LOW (ref 98–111)
Creatinine, Ser: 3.31 mg/dL — ABNORMAL HIGH (ref 0.44–1.00)
GFR, Estimated: 16 mL/min — ABNORMAL LOW (ref >60)
Glucose, Bld: 139 mg/dL — ABNORMAL HIGH (ref 70–99)
Potassium: 2.3 mmol/L — CL (ref 3.5–5.1)
Sodium: 133 mmol/L — ABNORMAL LOW (ref 135–145)
Total Bilirubin: 0.6 mg/dL (ref 0.3–1.2)
Total Protein: 9.1 g/dL — ABNORMAL HIGH (ref 6.5–8.1)

## 2021-11-11 LAB — CBC
HCT: 40.6 % (ref 36.0–46.0)
Hemoglobin: 13.8 g/dL (ref 12.0–15.0)
MCH: 29.4 pg (ref 26.0–34.0)
MCHC: 34 g/dL (ref 30.0–36.0)
MCV: 86.4 fL (ref 80.0–100.0)
Platelets: 337 10*3/uL (ref 150–400)
RBC: 4.7 MIL/uL (ref 3.87–5.11)
RDW: 12.1 % (ref 11.5–15.5)
WBC: 6.4 10*3/uL (ref 4.0–10.5)
nRBC: 0 % (ref 0.0–0.2)

## 2021-11-11 LAB — MAGNESIUM: Magnesium: 2.1 mg/dL (ref 1.7–2.4)

## 2021-11-11 LAB — LIPASE, BLOOD: Lipase: 28 U/L (ref 11–51)

## 2021-11-11 MED ORDER — FENTANYL CITRATE PF 50 MCG/ML IJ SOSY
25.0000 ug | PREFILLED_SYRINGE | Freq: Once | INTRAMUSCULAR | Status: AC
  Administered 2021-11-11: 25 ug via INTRAVENOUS
  Filled 2021-11-11: qty 1

## 2021-11-11 MED ORDER — ATORVASTATIN CALCIUM 80 MG PO TABS
80.0000 mg | ORAL_TABLET | Freq: Every day | ORAL | Status: DC
  Administered 2021-11-12 – 2021-11-16 (×5): 80 mg via ORAL
  Filled 2021-11-11: qty 2
  Filled 2021-11-11 (×4): qty 1

## 2021-11-11 MED ORDER — POTASSIUM CHLORIDE 10 MEQ/100ML IV SOLN
10.0000 meq | INTRAVENOUS | Status: AC
  Administered 2021-11-11 – 2021-11-12 (×4): 10 meq via INTRAVENOUS
  Filled 2021-11-11 (×2): qty 100

## 2021-11-11 MED ORDER — SODIUM CHLORIDE 0.9% FLUSH
3.0000 mL | Freq: Two times a day (BID) | INTRAVENOUS | Status: DC
  Administered 2021-11-12 – 2021-11-15 (×6): 3 mL via INTRAVENOUS

## 2021-11-11 MED ORDER — LACTATED RINGERS IV SOLN: INTRAVENOUS | Status: AC

## 2021-11-11 MED ORDER — METOPROLOL SUCCINATE ER 100 MG PO TB24
100.0000 mg | ORAL_TABLET | Freq: Every evening | ORAL | Status: DC
  Administered 2021-11-11 – 2021-11-15 (×5): 100 mg via ORAL
  Filled 2021-11-11: qty 1
  Filled 2021-11-11: qty 4
  Filled 2021-11-11 (×3): qty 1

## 2021-11-11 MED ORDER — PANTOPRAZOLE SODIUM 40 MG PO TBEC
40.0000 mg | DELAYED_RELEASE_TABLET | Freq: Every day | ORAL | Status: DC
  Administered 2021-11-11 – 2021-11-16 (×6): 40 mg via ORAL
  Filled 2021-11-11 (×6): qty 1

## 2021-11-11 MED ORDER — SENNOSIDES-DOCUSATE SODIUM 8.6-50 MG PO TABS: 1.0000 | ORAL_TABLET | Freq: Every evening | ORAL | Status: DC | PRN

## 2021-11-11 MED ORDER — LACTATED RINGERS IV BOLUS
1000.0000 mL | Freq: Once | INTRAVENOUS | Status: AC
  Administered 2021-11-11: 1000 mL via INTRAVENOUS

## 2021-11-11 MED ORDER — AMLODIPINE BESYLATE 10 MG PO TABS
10.0000 mg | ORAL_TABLET | Freq: Every day | ORAL | Status: DC
  Administered 2021-11-12 – 2021-11-16 (×5): 10 mg via ORAL
  Filled 2021-11-11: qty 1
  Filled 2021-11-11: qty 2
  Filled 2021-11-11 (×3): qty 1

## 2021-11-11 MED ORDER — ACETAMINOPHEN 650 MG RE SUPP: 650.0000 mg | Freq: Four times a day (QID) | RECTAL | Status: DC | PRN

## 2021-11-11 MED ORDER — POTASSIUM CHLORIDE CRYS ER 20 MEQ PO TBCR
40.0000 meq | EXTENDED_RELEASE_TABLET | Freq: Two times a day (BID) | ORAL | Status: DC
  Administered 2021-11-12 (×2): 40 meq via ORAL
  Filled 2021-11-11 (×2): qty 2

## 2021-11-11 MED ORDER — ACETAMINOPHEN 325 MG PO TABS
650.0000 mg | ORAL_TABLET | Freq: Four times a day (QID) | ORAL | Status: DC | PRN
  Administered 2021-11-11 – 2021-11-15 (×4): 650 mg via ORAL
  Filled 2021-11-11 (×4): qty 2

## 2021-11-11 MED ORDER — SPIRONOLACTONE 12.5 MG HALF TABLET: 12.5000 mg | ORAL_TABLET | Freq: Every day | ORAL | Status: DC

## 2021-11-11 MED ORDER — POTASSIUM CHLORIDE 10 MEQ/100ML IV SOLN
10.0000 meq | INTRAVENOUS | Status: AC
  Administered 2021-11-11 (×3): 10 meq via INTRAVENOUS
  Filled 2021-11-11 (×3): qty 100

## 2021-11-11 MED ORDER — ALPRAZOLAM 0.5 MG PO TABS
0.5000 mg | ORAL_TABLET | Freq: Every day | ORAL | Status: DC | PRN
  Administered 2021-11-13 – 2021-11-15 (×3): 0.5 mg via ORAL
  Filled 2021-11-11 (×3): qty 1

## 2021-11-11 NOTE — Assessment & Plan Note (Signed)
K 2.3 on admit, likely diuretic induced.  Receiving 6 runs IV potassium supplementation.  Resume oral supplement as well.  Repeat labs in AM.

## 2021-11-11 NOTE — Assessment & Plan Note (Signed)
Continue Toprol-XL, amlodipine.  Holding spironolactone with AKI.

## 2021-11-11 NOTE — ED Notes (Signed)
Patient transported to CT 

## 2021-11-11 NOTE — ED Notes (Signed)
Ambulates to the restroom at this time 

## 2021-11-11 NOTE — Assessment & Plan Note (Signed)
Sinus rhythm on admission with controlled rate. -Continue Toprol-XL -Continue Coumadin per pharmacy

## 2021-11-11 NOTE — ED Provider Notes (Signed)
  Physical Exam  BP (!) 148/75   Pulse (!) 57   Temp 98.3 F (36.8 C) (Oral)   Resp 14   Ht '5\' 4"'$  (1.626 m)   Wt 108.9 kg   SpO2 100%   BMI 41.20 kg/m   Physical Exam  Procedures  Procedures  ED Course / MDM   Clinical Course as of 11/11/21 1905  Wed Nov 11, 2021  1551 Urinalysis reviewed interpreted and normal [DR]  1551 CBC reviewed interpreted and within normal limits [DR]  1517 Complete metabolic panel is reviewed and interpreted and is significant for creatinine of 3.31 which is worsened from her baseline which has previously ranged from 2-3 Potassium remains significantly low at 2.3 with hypochloride EMEA with chloride of 83 [DR]    Clinical Course User Index [DR] Pattricia Boss, MD   Medical Decision Making Care assumed at 4 PM.  Patient is here with abdominal cramps.  Patient recently had an increase of torsemide to 20 mg twice daily by her cardiologist.  Patient is already on potassium supplementation.  Labs show potassium is 2.3 and creatinine increased to 3.3.  Signed out pending CT abdomen pelvis and admission  7:06 PM Patient CT abdomen pelvis is unremarkable.  Magnesium level checked was normal.  Her QTc is over 600 which is new for her.  I think this is likely from severe hypokalemia.  I think her hypokalemia likely is secondary to overdiuresis from torsemide.  Patient was given IV fluids and ordered another 3 runs of potassium.  Hospitalist to admit at this point.  CRITICAL CARE Performed by: Wandra Arthurs   Total critical care time: 30 minutes  Critical care time was exclusive of separately billable procedures and treating other patients.  Critical care was necessary to treat or prevent imminent or life-threatening deterioration.  Critical care was time spent personally by me on the following activities: development of treatment plan with patient and/or surrogate as well as nursing, discussions with consultants, evaluation of patient's response to treatment,  examination of patient, obtaining history from patient or surrogate, ordering and performing treatments and interventions, ordering and review of laboratory studies, ordering and review of radiographic studies, pulse oximetry and re-evaluation of patient's condition.   Problems Addressed: Abdominal pain, unspecified abdominal location: acute illness or injury AKI (acute kidney injury) ([place]): acute illness or injury Hypokalemia: acute illness or injury  Amount and/or Complexity of Data Reviewed Labs: ordered. Decision-making details documented in ED Course. Radiology: ordered and independent interpretation performed. Decision-making details documented in ED Course.  Risk Prescription drug management. Decision regarding hospitalization.          Drenda Freeze, MD 11/11/21 [place] Drop

## 2021-11-11 NOTE — Assessment & Plan Note (Signed)
New finding in setting of hypokalemia.  Correct potassium and monitor on telemetry.

## 2021-11-11 NOTE — ED Notes (Signed)
Lattie Haw from [facility] notified that the patient is finished drinking her contrast and ready for CT when they're available

## 2021-11-11 NOTE — Hospital Course (Signed)
Leslie [name] is a 56 years old female with medical history significant for HFpEF (EF 60-65% 08/04/2021), PAF on Coumadin, mitral stenosis s/p mechanical MVR and Maze procedure (04/2020), CKD stage IIIb, hypertension, hyperlipidemia presented to hospital with abdominal pain nausea decreased urinary output.  Patient is on torsemide daily at home.  In the ED patient had stable vitals.  Labs showed hypokalemia with potassium of 2.3 creatinine was elevated at 3.3 from baseline 1.4-1.6, lipase was 28.  WBC 6.4.  CT scan of the abdomen pelvis was negative for acute findings except for 7 mm nonobstructive right renal stone.  Patient was given IV fluids and runs of potassium and was admitted hospital for further evaluation and treatment.   Assessment and Plan:  * Acute renal failure superimposed on stage 3b chronic kidney disease (HCC) Creatinine on presentation was 3.3 compared to baseline around 1.4-1.6.  She was on diuretics and was hypovolemic on presentation.  Continue IV fluids.  Continue to hold torsemide and spironolactone.  CT abdomen and pelvis shows a nonobstructing 7 mm right renal stone without other acute findings or hydronephrosis.  Creatinine today at 2.6  Abdominal pain, nausea.  CT scan of the abdomen showed renal stone otherwise no acute findings.  Patient was 28.  Continue supportive care.  Continue Ringer lactate.  Urinalysis was negative for nitrites and leukocytes.  Prolonged QT interval Likely secondary to hypokalemia.  We will aggressively replace.   Hypokalemia Potassium level was 2.3 admission.  Continue IV and oral potassium supplements.  Continue to hold diuretics.  Magnesium level was 2.1.  Chronic heart failure with preserved ejection fraction (HFpEF) ([place]) Patient was hypovolemic on presentation.  Diuretics on hold.  Continue strict intake and output charting Daily weights. Continue Toprol-XL 100 mg daily   Paroxysmal atrial fibrillation (HCC) Continue Toprol and Coumadin.   Was in sinus rhythm on presentation.   S/P mitral valve replacement with Onyx bileaflet mechanical valve Continue Coumadin.   Hyperlipidemia Continue Lipitor.   Hypertension Continue Toprol-XL, amlodipine.  Holding spironolactone at this time.

## 2021-11-11 NOTE — Assessment & Plan Note (Signed)
Hypovolemic on admission.  Last EF 60-65% on 08/04/2021. -Holding diuretics, receiving IV fluid hydration -Monitor strict I/O's Daily weights -Continue Toprol-XL 100 mg daily

## 2021-11-11 NOTE — ED Triage Notes (Signed)
Pt. Stated, Leslie [name] had some abdominal pain with nausea and my urine smells and just not going right

## 2021-11-11 NOTE — ED Provider Notes (Signed)
[place] EMERGENCY DEPARTMENT Provider Note   CSN: [id number] Arrival date & time: 11/11/21  0945     History  Chief Complaint  Patient presents with   Abdominal Pain   Nausea   Back Pain    Leslie [name] is a 56 y.o. female.  HPI 56 year old female history of ckd, not on dialysis,  coronary artery disease, status post CABG, status postcholecystectomy, hypertension, presents today complaining of abdominal pain with nausea that began several days ago.  Patient reports that she was told she had a kidney stone on the right but there were no further plans to treat it.  She thinks it was in her kidney.  Pain is in the epigastrium to the right paraumbilical area.  She has had urinary frequency with small amounts of urination since this started.  She has had nausea but no vomiting.  The nausea does appear to be worse with food intake.  Has had chills but not had fever.  Denies cough or chest pain.  She is not dyspneic     Home Medications Prior to Admission medications   Medication Sig Start Date End Date Taking? Authorizing Provider  albuterol (VENTOLIN HFA) 108 (90 Base) MCG/ACT inhaler Inhale 2 puffs into the lungs every 6 (six) hours as needed for wheezing or shortness of breath. 11/03/21   McDonough, Si Gaul, PA-C  ALPRAZolam Duanne Moron) 0.5 MG tablet TAKE 1/2 TABLET BY MOUTH AT BEDTIME AS NEEDED FOR ANXIETY 07/15/21   McDonough, Lauren K, PA-C  amLODipine (NORVASC) 10 MG tablet TAKE 1 TABLET(10 MG) BY MOUTH DAILY. 08/10/21   Minna Merritts, MD  aspirin 81 MG EC tablet Take 81 mg by mouth daily.    [provider]  atorvastatin (LIPITOR) 80 MG tablet TAKE 1 TABLET BY MOUTH EVERY DAY AT 6 PM 06/01/21   Gollan, Kathlene November, MD  azithromycin (ZITHROMAX Z-PAK) 250 MG tablet Take 2 tablets (500 mg) on  Day 1,  followed by 1 tablet (250 mg) once daily on Days 2 through 5. 10/29/21   Duffy Bruce, MD  calcium carbonate (TUMS - DOSED IN MG ELEMENTAL CALCIUM) 500  MG chewable tablet Chew 500 mg by mouth daily as needed for indigestion or heartburn.    [provider]  enoxaparin (LOVENOX) 120 MG/0.8ML injection Inject 0.8 mLs (120 mg total) into the skin every 12 (twelve) hours. Patient not taking: Reported on 10/16/2021 07/10/21   Minna Merritts, MD  FEROCON capsule TAKE ONE CAPSULE BY MOUTH TWICE DAILY AFTER MEALS 02/17/21   Jonetta Osgood, NP  fluconazole (DIFLUCAN) 150 MG tablet TAKE 1 TABLET(150 MG) BY MOUTH 1 TIME FOR 1 DOSE. MAY TAKE AN ADDITIONAL DOSE AFTER 3 DAYS IF STILL SYMPTOMATIC 08/20/21   McDonough, Si Gaul, PA-C  metolazone (ZAROXOLYN) 5 MG tablet Take 1 tablet (5 mg total) by mouth daily. Take 30 mins prior to your morning Torsemide dose. 10/16/21 01/15/23  Kate Sable, MD  metoprolol succinate (TOPROL-XL) 100 MG 24 hr tablet TAKE 1 TABLET(100 MG) BY MOUTH TWICE DAILY WITH OR IMMEDIATELY FOLLOWING A MEAL 09/17/21   Theora Gianotti, NP  nitrofurantoin, macrocrystal-monohydrate, (MACROBID) 100 MG capsule Take 1 cap twice per day for 10 days. Patient not taking: Reported on 10/16/2021 08/20/21   [name], PA-C  omeprazole (PRILOSEC) 40 MG capsule TAKE ONE CAPSULE BY MOUTH DAILY 08/09/21   McDonough, Lauren K, PA-C  ondansetron (ZOFRAN-ODT) 4 MG disintegrating tablet Take 1 tablet (4 mg total) by mouth every 8 (  eight) hours as needed for nausea or vomiting. 10/29/21   Duffy Bruce, MD  oxyCODONE-acetaminophen (PERCOCET) 10-325 MG tablet Take 1 tablet by mouth every 6 (six) hours as needed. Patient not taking: Reported on 10/16/2021 10/06/20   [provider]  potassium chloride (MICRO-K) 10 MEQ CR capsule Take 1 capsule 4x daily for the next 2-3 days for your abdominal pain to improve, then go back to the dose prescribed by your doctor. IF your potassium continues to cause pain with the 20 mEq capsules, you can take these more frequently. 10/29/21   Duffy Bruce, MD  potassium chloride SA (KLOR-CON M) 20  MEQ tablet Take 40 MEQ (2 tabs) every AM, Take 20 MEQ (1 tab) every PM 10/26/21   Kate Sable, MD  spironolactone (ALDACTONE) 25 MG tablet Take 0.5 tablets (12.5 mg total) by mouth daily. 06/23/21   Minna Merritts, MD  sucralfate (CARAFATE) 1 g tablet Take 1 tablet (1 g total) by mouth 4 (four) times daily -  with meals and at bedtime for 7 days. 10/29/21 11/05/21  Duffy Bruce, MD  torsemide (DEMADEX) 20 MG tablet TAKE 2 TABLETS BY MOUTH TWICE DAILY 05/18/21   Minna Merritts, MD  warfarin (COUMADIN) 2 MG tablet TAKE 1/2 TO 1 TABLET BY MOUTH DAILY AS DIRECTED BY THE [facility]AG CLIN[facility] 11/02/21   End, Harrell Gave, MD      Allergies    Morphine, Oxycodone hcl, and Dilaudid [hydromorphone hcl]    Review of Systems   Review of Systems  Physical Exam Updated Vital Signs BP 139/72   Pulse (!) 56   Temp 98 F (36.7 C)   Resp 14   Ht 1.626 m ('5\' 4"'$ )   Wt 108.9 kg   SpO2 100%   BMI 41.20 kg/m  Physical Exam Vitals and nursing note reviewed.  Constitutional:      General: She is not in acute distress.    Appearance: She is well-developed. She is not ill-appearing.  HENT:     Head: Normocephalic.     Mouth/Throat:     Mouth: Mucous membranes are moist.  Eyes:     Extraocular Movements: Extraocular movements intact.  Cardiovascular:     Rate and Rhythm: Normal rate and regular rhythm.  Pulmonary:     Effort: Pulmonary effort is normal.     Breath sounds: Normal breath sounds.  Abdominal:     General: Abdomen is flat. Bowel sounds are decreased.     Palpations: Abdomen is soft.     Tenderness: There is abdominal tenderness in the right lower quadrant, epigastric area and periumbilical area.  Skin:    General: Skin is warm and dry.     Capillary Refill: Capillary refill takes less than 2 seconds.  Neurological:     General: No focal deficit present.     Mental Status: She is alert.  Psychiatric:        Mood and Affect: Mood normal.     ED Results / Procedures /  Treatments   Labs (all labs ordered are listed, but only abnormal results are displayed) Labs Reviewed  COMPREHENSIVE METABOLIC PANEL - Abnormal; Notable for the following components:      Result Value   Sodium 133 (*)    Potassium 2.3 (*)    Chloride 83 (*)    CO2 34 (*)    Glucose, Bld 139 (*)    BUN 85 (*)    Creatinine, Ser 3.31 (*)    Total Protein 9.1 (*)  AST 43 (*)    Alkaline Phosphatase 127 (*)    GFR, Estimated 16 (*)    Anion gap 16 (*)    All other components within normal limits  URINALYSIS, ROUTINE W REFLEX MICROSCOPIC - Abnormal; Notable for the following components:   Color, Urine STRAW (*)    All other components within normal limits  LIPASE, BLOOD  CBC  MAGNESIUM    EKG EKG Interpretation  Date/Time:  Wednesday November 11 2021 13:58:49 EDT Ventricular Rate:  60 PR Interval:  175 QRS Duration: 102 QT Interval:  622 QTC Calculation: 622 R Axis:   67 Text Interpretation: Sinus rhythm RSR' in V1 or V2, right VCD or RVH Prolonged QT interval Non-specific ST-t changes Confirmed by Pattricia Boss [phone]) on 11/11/2021 2:09:15 PM  Radiology No results found.  Procedures Procedures    Medications Ordered in ED Medications  potassium chloride 10 mEq in 100 mL IVPB (10 mEq Intravenous New Bag/Given 11/11/21 1559)  fentaNYL (SUBLIMAZE) injection 25 mcg (has no administration in time range)  lactated ringers bolus 1,000 mL (1,000 mLs Intravenous New Bag/Given 11/11/21 1609)    ED Course/ Medical Decision Making/ A&P Clinical Course as of 11/11/21 1622  Wed Nov 11, 2021  1551 Urinalysis reviewed interpreted and normal [DR]  1551 CBC reviewed interpreted and within normal limits [DR]  1497 Complete metabolic panel is reviewed and interpreted and is significant for creatinine of 3.31 which is worsened from her baseline which has previously ranged from 2-3 Potassium remains significantly low at 2.3 with hypochloride EMEA with chloride of 83 [DR]     Clinical Course User Index [DR] Pattricia Boss, MD                           Medical Decision Making 56 year old female end-stage renal disease, coronary artery disease, history of cholecystectomy and CABG presents today with right-sided abdominal pain for several days.  She has associated nausea.  The pain has been fairly constant.  She has some tenderness to palpation.  She has had some problems with hypokalemia over the past several weeks and has had her potassium increased.  Denies fever but has had some chills.  She has had nausea but no vomiting. Differential diagnosis includes but is not limited to colitis, UTI, appendicitis, hepatitis, pancreatitis, other diseases of the abdomen and pelvis, and gastroenteritis. 1 abdominal pain patient with some right-sided tenderness CT without contrast pending 2 end-stage renal disease with superimposed acute kidney injury with creatinine elevated to 3.3 patient will receive some IV hydration, magnesium added Discussed with Dr. Darl Householder who will assist with dispo after ct  3 electrolyte abnormalities with hypochloremia and hypokalemia patient is receiving potassium replete meant and will have magnesium checked.  She will likely need some ongoing IV replenishment Plan patient will need some ongoing IV fluids, electrolyte replenishment, after CT obtained  Amount and/or Complexity of Data Reviewed External Data Reviewed: radiology.    Details: Review of ultrasound from [place] in August 2022 reveals a cyst in the right upper pole of the kidney and no abnormality of the left and no evidence of obstructive uropathy Labs: ordered. Radiology: ordered.  Risk Prescription drug management.           Final Clinical Impression(s) / ED Diagnoses Final diagnoses:  Hypokalemia  Abdominal pain, unspecified abdominal location  AKI (acute kidney injury) ([place])    Rx / DC Orders ED Discharge Orders     None  Pattricia Boss, MD 11/11/21  [phone]

## 2021-11-11 NOTE — ED Notes (Signed)
This RN inquires if the patient may have something to eat

## 2021-11-11 NOTE — ED Notes (Signed)
This RN spoke with Elmyra Ricks from food and nutrition services who reports she will put a food tray order in for the patient

## 2021-11-11 NOTE — Assessment & Plan Note (Signed)
Creatinine 3.31 on admission compared to previous baseline around 1.4-1.6.  Has had elevated creatinine earlier this month likely diuretic induced.  Appears hypovolemic on admission.  CT A/P shows a nonobstructing 7 mm right renal stone without other acute findings. -Continue maintenance IV fluid hydration overnight -Hold torsemide and spironolactone -Monitor urine output and repeat labs in a.m.

## 2021-11-11 NOTE — ED Notes (Signed)
Per previous nurse, patient is unable to tolerate potassium IV due to "burning" sensation. Potassium slowed to 23m/hr for patient tolerance

## 2021-11-11 NOTE — Assessment & Plan Note (Signed)
Continue atorvastatin

## 2021-11-11 NOTE — ED Provider Triage Note (Signed)
Emergency Medicine Provider Triage Evaluation Note  Leslie [name] , a 56 y.o. female  was evaluated in triage.  Pt complains of dysuria and strong smelling urine since Friday.  Patient also complains of generalized abdominal pain which has been ongoing for some time.  She states she has a known kidney stone on the right side which was found on a scan in May of this year.  She does endorse nausea and vomiting for the past 2 days.  She denies chest pain, shortness of breath  Review of Systems  Positive: As above Negative: As above  Physical Exam  BP (!) 140/70 (BP Location: Right Arm)   Pulse 64   Temp 97.6 F (36.4 C) (Oral)   Resp 16   Ht '5\' 4"'$  (1.626 m)   Wt 108.9 kg   SpO2 95%   BMI 41.20 kg/m  Gen:   Awake, no distress   Resp:  Normal effort  MSK:   Moves extremities without difficulty  Other:  No tenderness to palpation of the abdomen, no CVA tenderness  Medical Decision Making  Medically screening exam initiated at 10:03 AM.  Appropriate orders placed.  Leslie [name] was informed that the remainder of the evaluation will be completed by another provider, this initial triage assessment does not replace that evaluation, and the importance of remaining in the ED until their evaluation is complete.     Dorothyann Peng, PA-C 11/11/21 1004

## 2021-11-11 NOTE — ED Notes (Signed)
Patient ambulates to the restroom at this time with a steady gait 

## 2021-11-11 NOTE — H&P (Signed)
History and Physical    Leslie [name] IOE:[id number] DOB: 12-11-1965 DOA: 11/11/2021  PCP: [name], PA-C  Patient coming from: Home  I have personally briefly reviewed patient's old medical records in [place]  Chief Complaint: Abdominal pain, nausea  HPI: Leslie [name] is a 56 y.o. female with medical history significant for HFpEF (EF 60-65% 08/04/2021), PAF on Coumadin, mitral stenosis s/p mechanical MVR and Maze procedure (04/2020), CKD stage IIIb, HTN, HLD who presents to the ED for evaluation of abdominal pain and nausea.  Patient reports developing significant nausea and abdominal pain/cramping yesterday.  She had been feeling very thirsty and has noticed decreased urine output than expected despite taking torsemide 20 mg daily.  She has also been taking potassium supplement after outpatient labs showed hypokalemia earlier this month.  She has noticed worsening GERD symptoms since taking her potassium tablets.  She has not had any appetite and has not eaten anything the last 24 hours.  She has not had any chest pain, dyspnea, emesis, diarrhea.  She has not had any swelling in her extremities.  ED Course  Labs/Imaging on admission: I have personally reviewed following labs and imaging studies.  Initial vitals showed BP 140/70, pulse 64, RR 16, temp 97.6 F, SPO2 95% on room air.  Labs show potassium 2.3, magnesium 2.1, BUN 85, creatinine 3.31 (baseline 1.4-1.6), serum glucose 139, sodium 133, AST 43, ALT 38, alk phos 127, total bilirubin 0.6, lipase 28.  WBC 6.4, hemoglobin 13.8, platelets 337,000.  CT abdomen/pelvis without contrast negative for acute findings.  7 mm nonobstructing right renal stone, tiny hiatal hernia noted.  Patient was given 1 L LR, ordered to receive IV K 10 mEq x 6 runs.  The hospitalist service was consulted to admit for further evaluation and management.  Review of Systems: All systems reviewed and are negative except as documented in  history of present illness above.   Past Medical History:  Diagnosis Date   (HFpEF) heart failure with preserved ejection fraction ([place])    a. 08/2017 Echo: EF 55-60%.  Grade 2 diastolic dysfunction; b. 01/8297 Echo: EF 50-55%, no rwma, Nl RV fxn, nl fxn'ing mech MV.   Allergy    Anemia    Anxiety    BRCA negative 03/22/2013   Bronchitis 02/19/2016   ON LEVAQUIN PO   Chest tightness    a. 08/2019 Cath: nl cors.   Cigarette smoker 09/11/2017   8-10 day   Dysrhythmia    GERD (gastroesophageal reflux disease)    History of kidney stones    Hyperlipidemia    Hypertension    Interstitial lung disease ([place])    a. CT 2013 b. 02/2018 CXR noted recurrent intersistial changes ILD vs chronic bronchitis   Moderate mitral stenosis    a.  08/2017 TEE: EF 60 to 65%.  Moderate mitral stenosis.  Mean gradient 14 mmHg.  Valve area 2.59 cm by planimetry, 2.72 cm by pressure half-time.   PAF (paroxysmal atrial fibrillation) ([place])    a. 08/2017 s/p TEE/DCCV; b. CHA2DS2VASc = 2-->warfarin.   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 04/2020 s/p 27/29 mm Onyx mech mitral valve-->chronic coumadin; b. 05/2020 Echo: Nl fxn'ing mech MV.    Past Surgical History:  Procedure Laterality Date  ABDOMINAL HYSTERECTOMY     total   ABDOMINAL HYSTERECTOMY     BREAST BIOPSY Bilateral 2012   BREAST BIOPSY Right 08-07-12   fibroadenomatous changes and columnar cells   BREAST BIOPSY  02/03/2015   stereo byrnett   BUBBLE STUDY  04/01/2020   Procedure: BUBBLE STUDY;  Surgeon: [name], MD;  Location: [place];  Service: Cardiovascular;;   CARDIAC CATHETERIZATION     CHOLECYSTECTOMY N/A 02/27/2016   Procedure: LAPAROSCOPIC CHOLECYSTECTOMY;  Surgeon: Christene Lye, MD;   Location: [facility] ORS;  Service: General;  Laterality: N/A;   CLIPPING OF ATRIAL APPENDAGE  04/29/2020   Procedure: CLIPPING OF ATRIAL APPENDAGE USING ATRICURE  PRO2 CLIP SIZE 45MM;  Surgeon: Rexene Alberts, MD;  Location: [facility];  Service: Open Heart Surgery;;   COLONOSCOPY WITH PROPOFOL N/A 09/26/2018   Procedure: COLONOSCOPY WITH PROPOFOL;  Surgeon: Lucilla Lame, MD;  Location: [facility] ENDOSCOPY;  Service: Endoscopy;  Laterality: N/A;   COLONOSCOPY WITH PROPOFOL N/A 07/16/2021   Procedure: COLONOSCOPY WITH PROPOFOL;  Surgeon: Lucilla Lame, MD;  Location[facility];  Service: Endoscopy;  Laterality: N/A;   CYSTOSCOPY W/ RETROGRADES Right 08/18/2018   Procedure: CYSTOSCOPY WITH RETROGRADE PYELOGRAM;  Surgeon: Billey Co, MD;  Location: [facility];  Service: Urology;  Laterality: Right;   CYSTOSCOPY/URETEROSCOPY/HOLMIUM LASER/STENT PLACEMENT Right 08/18/2018   Procedure: CYSTOSCOPY/URETEROSCOPY/STENT PLACEMENT;  Surgeon: Billey Co, MD;  Location: [facility] ORS;  Service: Urology;  Laterality: Right;   DIAGNOSTIC LAPAROSCOPY     ESOPHAGOGASTRODUODENOSCOPY N/A 07/16/2021   Procedure: ESOPHAGOGASTRODUODENOSCOPY (EGD);  Surgeon: Lucilla Lame, MD;  Location: [facility]COPY;  Service: Endoscopy;  Laterality: N/A;   ESOPHAGOGASTRODUODENOSCOPY (EGD) WITH PROPOFOL N/A 09/26/2018   Procedure: ESOPHAGOGASTRODUODENOSCOPY (EGD) WITH PROPOFOL;  Surgeon: Lucilla Lame, MD;  Location: [facility] ENDOSCOPY;  Service: Endoscopy;  Laterality: N/A;   GIVENS CAPSULE STUDY N/A 11/03/2018   Procedure: GIVENS CAPSULE STUDY;  Surgeon: Lucilla Lame, MD;  Location[facility]COPY;  Service: Endoscopy;  Laterality: N/A;   JOINT REPLACEMENT Left    knee   KNEE ARTHROPLASTY Right 04/09/2019   Procedure: COMPUTER ASSISTED TOTAL KNEE ARTHROPLASTY;  Surgeon: Dereck Leep, MD;  Location: [facility];  Service: Orthopedics;  Laterality: Right;   KNEE CLOSED REDUCTION Left 04/15/2015   Procedure: CLOSED MANIPULATION KNEE;  Surgeon: Thornton Park, MD;  Location: [facility];  Service: Orthopedics;  Laterality: Left;   KNEE SURGERY     MAZE N/A 04/29/2020   Procedure: MAZE;  Surgeon: Rexene Alberts, MD;  Location: [place];  Service: Open Heart Surgery;  Laterality: N/A;   MITRAL VALVE REPLACEMENT N/A 04/29/2020   Procedure: MITRAL VALVE (MV) REPLACEMENT USING ON-X VALVE SIZE 27/29MM;  Surgeon: Rexene Alberts, MD;  Location: [place];  Service: Open Heart Surgery;  Laterality: N/A;   MULTIPLE EXTRACTIONS WITH ALVEOLOPLASTY N/A 02/28/2020   Procedure: MULTIPLE EXTRACTION WITH ALVEOLOPLASTY;  Surgeon: [name], DMD;  Location: [place];  Service: Dentistry;  Laterality: N/A;   RIGHT/LEFT HEART CATH AND CORONARY ANGIOGRAPHY Bilateral 09/06/2019   Procedure: RIGHT/LEFT HEART CATH AND CORONARY ANGIOGRAPHY;  Surgeon: Minna Merritts, MD;  Location: [place] CV LAB;  Service: Cardiovascular;  Laterality: Bilateral;   TEE WITHOUT CARDIOVERSION N/A 09/13/2017   Procedure: TRANSESOPHAGEAL ECHOCARDIOGRAM (TEE);  Surgeon: Nelva Bush, MD;  Location: [facility];  Service: Cardiovascular;  Laterality: N/A;   TEE WITHOUT CARDIOVERSION N/A 04/01/2020   Procedure: TRANSESOPHAGEAL ECHOCARDIOGRAM (TEE);  Surgeon: [name], MD;  Location: [place];  Service: Cardiovascular;  Laterality: N/A;   TEE WITHOUT CARDIOVERSION N/A 04/29/2020   Procedure: TRANSESOPHAGEAL ECHOCARDIOGRAM (TEE);  Surgeon: Rexene Alberts, MD;  Location: [place];  Service: Open Heart Surgery;  Laterality: N/A;   TOTAL KNEE ARTHROPLASTY Left 12/25/2014   Procedure: TOTAL KNEE ARTHROPLASTY;  Surgeon: Thornton Park, MD;  Location: [facility];  Service: Orthopedics;  Laterality: Left;   TUBAL LIGATION      Social History:  reports that she quit smoking about 5 years ago. Her smoking use included cigarettes. She has a 10.00 pack-year smoking history. She has never used smokeless tobacco. She reports current alcohol use. She reports that she does not use  drugs.  Allergies  Allergen Reactions   Morphine Hives and Rash   Oxycodone Hcl Hives and Itching   Dilaudid [Hydromorphone Hcl] Itching    Family History  Problem Relation Age of Onset   Cancer Mother 67       breast   Hypertension Mother    Cancer Maternal Aunt        breast   Cancer Maternal Grandmother        breast   Osteoarthritis Father    Hypertension Father      Prior to Admission medications   Medication Sig Start Date End Date Taking? Authorizing Provider  albuterol (VENTOLIN HFA) 108 (90 Base) MCG/ACT inhaler Inhale 2 puffs into the lungs every 6 (six) hours as needed for wheezing or shortness of breath. 11/03/21   McDonough, Si Gaul, PA-C  ALPRAZolam Duanne Moron) 0.5 MG tablet TAKE 1/2 TABLET BY MOUTH AT BEDTIME AS NEEDED FOR ANXIETY 07/15/21   McDonough, Lauren K, PA-C  amLODipine (NORVASC) 10 MG tablet TAKE 1 TABLET(10 MG) BY MOUTH DAILY. 08/10/21   Minna Merritts, MD  aspirin 81 MG EC tablet Take 81 mg by mouth daily.    [provider]  atorvastatin (LIPITOR) 80 MG tablet TAKE 1 TABLET BY MOUTH EVERY DAY AT 6 PM 06/01/21   Gollan, Kathlene November, MD  azithromycin (ZITHROMAX Z-PAK) 250 MG tablet Take 2 tablets (500 mg) on  Day 1,  followed by 1 tablet (250 mg) once daily on Days 2 through 5. 10/29/21   Duffy Bruce, MD  calcium carbonate (TUMS - DOSED IN MG ELEMENTAL CALCIUM) 500 MG chewable tablet Chew 500 mg by mouth daily as needed for indigestion or heartburn.    [provider]  enoxaparin (LOVENOX) 120 MG/0.8ML injection Inject 0.8 mLs (120 mg total) into the skin every 12 (twelve) hours. Patient not taking: Reported on 10/16/2021 07/10/21   Minna Merritts, MD  FEROCON capsule TAKE ONE CAPSULE BY MOUTH TWICE DAILY AFTER MEALS 02/17/21   Jonetta Osgood, NP  fluconazole (DIFLUCAN) 150 MG tablet TAKE 1 TABLET(150 MG) BY MOUTH 1 TIME FOR 1 DOSE. MAY TAKE AN ADDITIONAL DOSE AFTER 3 DAYS IF STILL SYMPTOMATIC 08/20/21   McDonough, Si Gaul, PA-C   metolazone (ZAROXOLYN) 5 MG tablet Take 1 tablet (5 mg total) by mouth daily. Take 30 mins prior to your morning Torsemide dose. 10/16/21 01/15/23  Kate Sable, MD  metoprolol succinate (TOPROL-XL) 100 MG 24 hr tablet TAKE 1 TABLET(100 MG) BY MOUTH TWICE DAILY WITH OR IMMEDIATELY FOLLOWING A MEAL 09/17/21   Theora Gianotti, NP  nitrofurantoin, macrocrystal-monohydrate, (MACROBID) 100 MG capsule Take 1 cap twice per day for 10 days. Patient not taking: Reported on 10/16/2021 08/20/21   [name], PA-C  omeprazole (PRILOSEC) 40 MG capsule TAKE ONE CAPSULE BY MOUTH DAILY 08/09/21   Drema Dallas  K, PA-C  ondansetron (ZOFRAN-ODT) 4 MG disintegrating tablet Take 1 tablet (4 mg total) by mouth every 8 (eight) hours as needed for nausea or vomiting. 10/29/21   Duffy Bruce, MD  oxyCODONE-acetaminophen (PERCOCET) 10-325 MG tablet Take 1 tablet by mouth every 6 (six) hours as needed. Patient not taking: Reported on 10/16/2021 10/06/20   [provider]  potassium chloride (MICRO-K) 10 MEQ CR capsule Take 1 capsule 4x daily for the next 2-3 days for your abdominal pain to improve, then go back to the dose prescribed by your doctor. IF your potassium continues to cause pain with the 20 mEq capsules, you can take these more frequently. 10/29/21   Duffy Bruce, MD  potassium chloride SA (KLOR-CON M) 20 MEQ tablet Take 40 MEQ (2 tabs) every AM, Take 20 MEQ (1 tab) every PM 10/26/21   Kate Sable, MD  spironolactone (ALDACTONE) 25 MG tablet Take 0.5 tablets (12.5 mg total) by mouth daily. 06/23/21   Minna Merritts, MD  sucralfate (CARAFATE) 1 g tablet Take 1 tablet (1 g total) by mouth 4 (four) times daily -  with meals and at bedtime for 7 days. 10/29/21 11/05/21  Duffy Bruce, MD  torsemide (DEMADEX) 20 MG tablet TAKE 2 TABLETS BY MOUTH TWICE DAILY 05/18/21   Minna Merritts, MD  warfarin (COUMADIN) 2 MG tablet TAKE 1/2 TO 1 TABLET BY MOUTH DAILY AS DIRECTED BY THE  [facility] 11/02/21   End, Harrell Gave, MD    Physical Exam: Vitals:   11/11/21 1743 11/11/21 1900 11/11/21 2000 11/11/21 2030  BP:  (!) 141/68 134/68 (!) 142/77  Pulse:  62 62 64  Resp:  16 14 (!) 29  Temp: 98.3 F (36.8 C)     TempSrc: Oral     SpO2:  99% 100% 97%  Weight:      Height:       Constitutional: Resting in bed, NAD, calm, comfortable Eyes: EOMI, lids and conjunctivae normal ENMT: Mucous membranes are moist. Posterior pharynx clear of any exudate or lesions.Normal dentition.  Neck: normal, supple, no masses. Respiratory: clear to auscultation bilaterally, no wheezing, no crackles. Normal respiratory effort. No accessory muscle use.  Cardiovascular: Regular rate and rhythm, no murmurs / rubs / gallops. No extremity edema. 2+ pedal pulses. Abdomen: no tenderness, no masses palpated. Musculoskeletal: no clubbing / cyanosis. No joint deformity upper and lower extremities. Good ROM, no contractures. Normal muscle tone.  Skin: no rashes, lesions, ulcers. No induration Neurologic: Sensation intact. Strength equal bilaterally. Psychiatric: Normal judgment and insight. Alert and oriented x 3. Normal mood.   EKG: Personally reviewed. Sinus rhythm, rate 60, QTc 622.  QT prolongation is new when compared to prior.  Assessment/Plan Principal Problem:   Acute renal failure superimposed on stage 3b chronic kidney disease (HCC) Active Problems:   Hypokalemia   Prolonged QT interval   Chronic heart failure with preserved ejection fraction (HFpEF) (HCC)   Paroxysmal atrial fibrillation (HCC)   S/P mitral valve replacement with Onyx bileaflet mechanical valve   Hypertension   Hyperlipidemia   Leslie [name] is a 56 y.o. female with medical history significant for HFpEF (EF 60-65% 08/04/2021), PAF on Coumadin, mitral stenosis s/p mechanical MVR and Maze procedure (04/2020), CKD stage IIIb, HTN, HLD who is admitted with AKI on CKD stage IIIb and hypokalemia.  Assessment and  Plan: * Acute renal failure superimposed on stage 3b chronic kidney disease (HCC) Creatinine 3.31 on admission compared to previous baseline around 1.4-1.6.  Has had  elevated creatinine earlier this month likely diuretic induced.  Appears hypovolemic on admission.  CT A/P shows a nonobstructing 7 mm right renal stone without other acute findings. -Continue maintenance IV fluid hydration overnight -Hold torsemide and spironolactone -Monitor urine output and repeat labs in a.m.  Prolonged QT interval New finding in setting of hypokalemia.  Correct potassium and monitor on telemetry.  Hypokalemia K 2.3 on admit, likely diuretic induced.  Receiving 6 runs IV potassium supplementation.  Resume oral supplement as well.  Repeat labs in AM.  Chronic heart failure with preserved ejection fraction (HFpEF) (HCC) Hypovolemic on admission.  Last EF 60-65% on 08/04/2021. -Holding diuretics, receiving IV fluid hydration -Monitor strict I/O's Daily weights -Continue Toprol-XL 100 mg daily  Paroxysmal atrial fibrillation (HCC) Sinus rhythm on admission with controlled rate. -Continue Toprol-XL -Continue Coumadin per pharmacy  S/P mitral valve replacement with Onyx bileaflet mechanical valve Continue Coumadin.  Hyperlipidemia Continue atorvastatin.  Hypertension Continue Toprol-XL, amlodipine.  Holding spironolactone with AKI.  DVT prophylaxis: Coumadin Code Status: Full code, confirmed with patient on admission Family Communication: Discussed with patient, she has discussed with family Disposition Plan: From home and likely discharge to home pending clinical progress Consults called: None Severity of Illness: The appropriate patient status for this patient is OBSERVATION. Observation status is judged to be reasonable and necessary in order to provide the required intensity of service to ensure the patient's safety. The patient's presenting symptoms, physical exam findings, and initial  radiographic and laboratory data in the context of their medical condition is felt to place them at decreased risk for further clinical deterioration. Furthermore, it is anticipated that the patient will be medically stable for discharge from the hospital within 2 midnights of admission.   Zada Finders MD [facility]  If 7PM-7AM, please contact night-coverage [email or web address]  11/11/2021, 9:03 PM

## 2021-11-11 NOTE — Assessment & Plan Note (Signed)
Continue Coumadin. 

## 2021-11-11 NOTE — ED Notes (Signed)
PATIENT STARTED DRINKING CONTRAST AT THIS TIME AND VERBALIZES INSTRUCTIONS CORRECTLY

## 2021-11-11 NOTE — ED Notes (Signed)
This RN spoke with CT tech who reports they are sending for the patient now

## 2021-11-12 DIAGNOSIS — N1832 Chronic kidney disease, stage 3b: Secondary | ICD-10-CM | POA: Diagnosis present

## 2021-11-12 DIAGNOSIS — I130 Hypertensive heart and chronic kidney disease with heart failure and stage 1 through stage 4 chronic kidney disease, or unspecified chronic kidney disease: Secondary | ICD-10-CM | POA: Diagnosis present

## 2021-11-12 DIAGNOSIS — E861 Hypovolemia: Secondary | ICD-10-CM | POA: Diagnosis present

## 2021-11-12 DIAGNOSIS — Z7982 Long term (current) use of aspirin: Secondary | ICD-10-CM | POA: Diagnosis not present

## 2021-11-12 DIAGNOSIS — E871 Hypo-osmolality and hyponatremia: Secondary | ICD-10-CM | POA: Diagnosis not present

## 2021-11-12 DIAGNOSIS — Z79899 Other long term (current) drug therapy: Secondary | ICD-10-CM | POA: Diagnosis not present

## 2021-11-12 DIAGNOSIS — Z803 Family history of malignant neoplasm of breast: Secondary | ICD-10-CM | POA: Diagnosis not present

## 2021-11-12 DIAGNOSIS — Z7901 Long term (current) use of anticoagulants: Secondary | ICD-10-CM | POA: Diagnosis not present

## 2021-11-12 DIAGNOSIS — I10 Essential (primary) hypertension: Secondary | ICD-10-CM

## 2021-11-12 DIAGNOSIS — N200 Calculus of kidney: Secondary | ICD-10-CM | POA: Diagnosis present

## 2021-11-12 DIAGNOSIS — Z6841 Body Mass Index (BMI) 40.0 and over, adult: Secondary | ICD-10-CM | POA: Diagnosis not present

## 2021-11-12 DIAGNOSIS — N179 Acute kidney failure, unspecified: Secondary | ICD-10-CM | POA: Diagnosis not present

## 2021-11-12 DIAGNOSIS — I5032 Chronic diastolic (congestive) heart failure: Secondary | ICD-10-CM

## 2021-11-12 DIAGNOSIS — Z23 Encounter for immunization: Secondary | ICD-10-CM | POA: Diagnosis not present

## 2021-11-12 DIAGNOSIS — Z885 Allergy status to narcotic agent status: Secondary | ICD-10-CM | POA: Diagnosis not present

## 2021-11-12 DIAGNOSIS — E785 Hyperlipidemia, unspecified: Secondary | ICD-10-CM

## 2021-11-12 DIAGNOSIS — I480 Paroxysmal atrial fibrillation: Secondary | ICD-10-CM

## 2021-11-12 DIAGNOSIS — Z954 Presence of other heart-valve replacement: Secondary | ICD-10-CM

## 2021-11-12 DIAGNOSIS — E876 Hypokalemia: Secondary | ICD-10-CM

## 2021-11-12 DIAGNOSIS — Z8249 Family history of ischemic heart disease and other diseases of the circulatory system: Secondary | ICD-10-CM | POA: Diagnosis not present

## 2021-11-12 DIAGNOSIS — N170 Acute kidney failure with tubular necrosis: Secondary | ICD-10-CM | POA: Diagnosis present

## 2021-11-12 DIAGNOSIS — Z952 Presence of prosthetic heart valve: Secondary | ICD-10-CM | POA: Diagnosis not present

## 2021-11-12 DIAGNOSIS — I4821 Permanent atrial fibrillation: Secondary | ICD-10-CM | POA: Diagnosis present

## 2021-11-12 DIAGNOSIS — R9431 Abnormal electrocardiogram [ECG] [EKG]: Secondary | ICD-10-CM

## 2021-11-12 DIAGNOSIS — J849 Interstitial pulmonary disease, unspecified: Secondary | ICD-10-CM | POA: Diagnosis present

## 2021-11-12 DIAGNOSIS — K219 Gastro-esophageal reflux disease without esophagitis: Secondary | ICD-10-CM | POA: Diagnosis present

## 2021-11-12 DIAGNOSIS — R109 Unspecified abdominal pain: Secondary | ICD-10-CM | POA: Diagnosis present

## 2021-11-12 DIAGNOSIS — Z87891 Personal history of nicotine dependence: Secondary | ICD-10-CM | POA: Diagnosis not present

## 2021-11-12 DIAGNOSIS — T502X5A Adverse effect of carbonic-anhydrase inhibitors, benzothiadiazides and other diuretics, initial encounter: Secondary | ICD-10-CM | POA: Diagnosis present

## 2021-11-12 LAB — COMPREHENSIVE METABOLIC PANEL
ALT: 33 U/L (ref 0–44)
AST: 42 U/L — ABNORMAL HIGH (ref 15–41)
Albumin: 3.5 g/dL (ref 3.5–5.0)
Alkaline Phosphatase: 96 U/L (ref 38–126)
Anion gap: 13 (ref 5–15)
BUN: 77 mg/dL — ABNORMAL HIGH (ref 6–20)
CO2: 28 mmol/L (ref 22–32)
Calcium: 8.2 mg/dL — ABNORMAL LOW (ref 8.9–10.3)
Chloride: 88 mmol/L — ABNORMAL LOW (ref 98–111)
Creatinine, Ser: 2.67 mg/dL — ABNORMAL HIGH (ref 0.44–1.00)
GFR, Estimated: 20 mL/min — ABNORMAL LOW (ref >60)
Glucose, Bld: 111 mg/dL — ABNORMAL HIGH (ref 70–99)
Potassium: 2.6 mmol/L — CL (ref 3.5–5.1)
Sodium: 129 mmol/L — ABNORMAL LOW (ref 135–145)
Total Bilirubin: 0.7 mg/dL (ref 0.3–1.2)
Total Protein: 6.9 g/dL (ref 6.5–8.1)

## 2021-11-12 LAB — CBC
HCT: 33.6 % — ABNORMAL LOW (ref 36.0–46.0)
Hemoglobin: 11.4 g/dL — ABNORMAL LOW (ref 12.0–15.0)
MCH: 29.3 pg (ref 26.0–34.0)
MCHC: 33.9 g/dL (ref 30.0–36.0)
MCV: 86.4 fL (ref 80.0–100.0)
Platelets: 286 10*3/uL (ref 150–400)
RBC: 3.89 MIL/uL (ref 3.87–5.11)
RDW: 12.2 % (ref 11.5–15.5)
WBC: 7.5 10*3/uL (ref 4.0–10.5)
nRBC: 0 % (ref 0.0–0.2)

## 2021-11-12 LAB — PROTIME-INR
INR: 2.9 — ABNORMAL HIGH (ref 0.8–1.2)
Prothrombin Time: 30.2 seconds — ABNORMAL HIGH (ref 11.4–15.2)

## 2021-11-12 MED ORDER — WARFARIN SODIUM 2 MG PO TABS
2.0000 mg | ORAL_TABLET | Freq: Once | ORAL | Status: AC
  Administered 2021-11-12: 2 mg via ORAL
  Filled 2021-11-12: qty 1

## 2021-11-12 MED ORDER — POTASSIUM CHLORIDE 10 MEQ/100ML IV SOLN
10.0000 meq | INTRAVENOUS | Status: AC
  Administered 2021-11-12 (×3): 10 meq via INTRAVENOUS
  Filled 2021-11-12 (×3): qty 100

## 2021-11-12 MED ORDER — WARFARIN - PHARMACIST DOSING INPATIENT: Freq: Every day | Status: DC

## 2021-11-12 MED ORDER — POTASSIUM CHLORIDE 10 MEQ/100ML IV SOLN
INTRAVENOUS | Status: AC
  Filled 2021-11-12: qty 100

## 2021-11-12 MED ORDER — PNEUMOCOCCAL 20-VAL CONJ VACC 0.5 ML IM SUSY
0.5000 mL | PREFILLED_SYRINGE | INTRAMUSCULAR | Status: AC
  Administered 2021-11-14: 0.5 mL via INTRAMUSCULAR
  Filled 2021-11-12: qty 0.5

## 2021-11-12 MED ORDER — POTASSIUM CHLORIDE 10 MEQ/100ML IV SOLN
10.0000 meq | Freq: Once | INTRAVENOUS | Status: AC
  Administered 2021-11-12: 10 meq via INTRAVENOUS
  Filled 2021-11-12: qty 100

## 2021-11-12 MED ORDER — WARFARIN SODIUM 1 MG PO TABS: 1.0000 mg | ORAL_TABLET | ORAL | Status: DC

## 2021-11-12 NOTE — Progress Notes (Signed)
ANTICOAGULATION CONSULT NOTE - Initial Consult  Pharmacy Consult for warfarin Indication: mechanical mitral valve and atrial fibrillation s/p maze procedure  Allergies  Allergen Reactions   Morphine Hives and Rash   Oxycodone Hcl Hives and Itching   Dilaudid [Hydromorphone Hcl] Itching    Patient Measurements: Height: '5\' 4"'  (162.6 cm) Weight: 110 kg (242 lb 8 oz) IBW/kg (Calculated) : 54.7  Vital Signs: Temp: 98.6 F (37 C) (10/26 1449) Temp Source: Oral (10/26 1449) BP: 135/63 (10/26 1449) Pulse Rate: 90 (10/26 1450)  Labs: Recent Labs    11/11/21 1007 11/12/21 0018 11/12/21 0215  HGB 13.8  --  11.4*  HCT 40.6  --  33.6*  PLT 337  --  286  LABPROT  --  30.2*  --   INR  --  2.9*  --   CREATININE 3.31*  --  2.67*     Estimated Creatinine Clearance: 28.9 mL/min (A) (by C-G formula based on SCr of 2.67 mg/dL (H)).   Medical History: Past Medical History:  Diagnosis Date   (HFpEF) heart failure with preserved ejection fraction ([place])    a. 08/2017 Echo: EF 55-60%.  Grade 2 diastolic dysfunction; b. 0/3159 Echo: EF 50-55%, no rwma, Nl RV fxn, nl fxn'ing mech MV.   Allergy    Anemia    Anxiety    BRCA negative 03/22/2013   Bronchitis 02/19/2016   ON LEVAQUIN PO   Chest tightness    a. 08/2019 Cath: nl cors.   Cigarette smoker 09/11/2017   8-10 day   Dysrhythmia    GERD (gastroesophageal reflux disease)    History of kidney stones    Hyperlipidemia    Hypertension    Interstitial lung disease ([place])    a. CT 2013 b. 02/2018 CXR noted recurrent intersistial changes ILD vs chronic bronchitis   Moderate mitral stenosis    a.  08/2017 TEE: EF 60 to 65%.  Moderate mitral stenosis.  Mean gradient 14 mmHg.  Valve area 2.59 cm by planimetry, 2.72 cm by pressure half-time.   PAF (paroxysmal atrial fibrillation) ([place])    a. 08/2017 s/p TEE/DCCV; b. CHA2DS2VASc = 2-->warfarin.   Pneumonia    S/P Maze operation for atrial fibrillation 04/29/2020   Complete bilateral  atrial lesion set using bipolar radiofrequency and cryothermy ablation with clipping of LA appendage   S/P mitral valve replacement with Onyx bileaflet mechanical valve 04/29/2020   a. 04/2020 s/p 27/29 mm Onyx mech mitral valve-->chronic coumadin; b. 05/2020 Echo: Nl fxn'ing mech MV.    Assessment: 56 y/o female presented to the ED for abdominal pain and nausea. Pt is on warfarin PTA for mechanical mitral valve and afib. Pharmacy consulted to resume warfarin. Patient reports regimen is 2 mg every day except 1 mg on SuTh; however, most recent anticoagulation clinic note states 7m daily except 160mon MWF (from 11/04/21). Patient reports last dose taken 10/24 pm. 10/26 INR 2.9 which is therapeutic.  Goal of Therapy:  INR 2.5-3.5 Monitor platelets by anticoagulation protocol: Yes   Plan:  Warfarin 29m44mO x 1 - clarify warfarin regimen with patient in AM Monitor daily INR, CBC, s/sx bleeding   HalArturo MortonharmD, BCPS Please check AMION for all [facility] numbers Clinical Pharmacist 11/12/2021 3:17 PM

## 2021-11-12 NOTE — Progress Notes (Signed)
ANTICOAGULATION CONSULT NOTE - Initial Consult  Pharmacy Consult for warfarin Indication: mechanical mitral valve and atrial fibrillation s/p maze procedure  Allergies  Allergen Reactions   Morphine Hives and Rash   Oxycodone Hcl Hives and Itching   Dilaudid [Hydromorphone Hcl] Itching    Patient Measurements: Height: '5\' 4"'  (162.6 cm) Weight: 108.9 kg (240 lb) IBW/kg (Calculated) : 54.7  Vital Signs: Temp: 98.1 F (36.7 C) (10/26 0408) Temp Source: Oral (10/26 0408) BP: 149/88 (10/26 0405) Pulse Rate: 65 (10/26 0405)  Labs: Recent Labs    11/11/21 1007 11/12/21 0018 11/12/21 0215  HGB 13.8  --  11.4*  HCT 40.6  --  33.6*  PLT 337  --  286  LABPROT  --  30.2*  --   INR  --  2.9*  --   CREATININE 3.31*  --  2.67*    Estimated Creatinine Clearance: 28.7 mL/min (A) (by C-G formula based on SCr of 2.67 mg/dL (H)).   Medical History: Past Medical History:  Diagnosis Date   (HFpEF) heart failure with preserved ejection fraction ([place])    a. 08/2017 Echo: EF 55-60%.  Grade 2 diastolic dysfunction; b. 03/7094 Echo: EF 50-55%, no rwma, Nl RV fxn, nl fxn'ing mech MV.   Allergy    Anemia    Anxiety    BRCA negative 03/22/2013   Bronchitis 02/19/2016   ON LEVAQUIN PO   Chest tightness    a. 08/2019 Cath: nl cors.   Cigarette smoker 09/11/2017   8-10 day   Dysrhythmia    GERD (gastroesophageal reflux disease)    History of kidney stones    Hyperlipidemia    Hypertension    Interstitial lung disease ([place])    a. CT 2013 b. 02/2018 CXR noted recurrent intersistial changes ILD vs chronic bronchitis   Moderate mitral stenosis    a.  08/2017 TEE: EF 60 to 65%.  Moderate mitral stenosis.  Mean gradient 14 mmHg.  Valve area 2.59 cm by planimetry, 2.72 cm by pressure half-time.   PAF (paroxysmal atrial fibrillation) ([place])    a. 08/2017 s/p TEE/DCCV; b. CHA2DS2VASc = 2-->warfarin.   Pneumonia    S/P Maze operation for atrial fibrillation 04/29/2020   Complete bilateral atrial  lesion set using bipolar radiofrequency and cryothermy ablation with clipping of LA appendage   S/P mitral valve replacement with Onyx bileaflet mechanical valve 04/29/2020   a. 04/2020 s/p 27/29 mm Onyx mech mitral valve-->chronic coumadin; b. 05/2020 Echo: Nl fxn'ing mech MV.    Assessment: 56 y/o female presented to the ED for abdominal pain and nausea. Pt is on warfarin PTA for mechanical mitral valve and afib. Pharmacy consulted to resume warfarin. Regimen is 2 mg every day except 1 mg on SuTh. Patient reports last dose taken 10/24 pm. 10/26 INR 2.9 which is therapeutic.  Goal of Therapy:  INR 2.5-3.5 Monitor platelets by anticoagulation protocol: Yes   Plan:  Continue home warfarin regimen starting 10/26 at 1600. Monitor CBC, INR, and for s/sx of bleeding.  Lerry Liner, PharmD Candidate class of 2024 11/12/2021,4:10 AM

## 2021-11-12 NOTE — Progress Notes (Addendum)
PROGRESS NOTE    Leslie [name]  IRS:[id number] DOB: 06-17-1965 DOA: 11/11/2021 PCP: [name], PA-C    Brief Narrative:  Leslie [name] is a 56 years old female with medical history significant for HFpEF (EF 60-65% 08/04/2021), PAF on Coumadin, mitral stenosis s/p mechanical MVR and Maze procedure (04/2020), CKD stage IIIb, hypertension, hyperlipidemia presented to hospital with abdominal pain nausea decreased urinary output.  Patient is on torsemide daily at home.  In the ED patient had stable vitals.  Labs showed hypokalemia with potassium of 2.3, creatinine was elevated at 3.3 from baseline 1.4-1.6, lipase was 28.  WBC 6.4.  CT scan of the abdomen pelvis was negative for acute findings except for 7 mm nonobstructive right renal stone.  Patient was given IV fluids and runs of potassium and was admitted hospital for further evaluation and treatment.   Assessment and Plan:  Principal Problem:   Acute renal failure superimposed on stage 3b chronic kidney disease (HCC) Active Problems:   Hypokalemia   Prolonged QT interval   Chronic heart failure with preserved ejection fraction (HFpEF) (HCC)   Paroxysmal atrial fibrillation (HCC)   S/P mitral valve replacement with Onyx bileaflet mechanical valve   Hypertension   Hyperlipidemia   * Acute renal failure superimposed on stage 3b chronic kidney disease (HCC) Creatinine on presentation was 3.3 compared to baseline around 1.4-1.6.  She was on diuretics and was hypovolemic on presentation. Continue to hold torsemide and spironolactone.  CT abdomen and pelvis shows a nonobstructing 7 mm right renal stone without other acute findings or hydronephrosis.  Creatinine today at 2.6. Continue IV fluids.    Abdominal pain, nausea, frequent urination with burning micturition.  Possible UTI.  CT scan of the abdomen showed renal stone otherwise no acute findings.  Lipase was 28.  Continue supportive care.  Continue IV fluids for now.  Urinalysis was  negative for nitrites and leukocytes.  Hold off with antibiotics today.  Prolonged QT interval Likely secondary to hypokalemia.  We will aggressively replace.  Telemetry monitor.   Hypokalemia Potassium level was 2.3 admission.  Continue IV and oral potassium supplements.  Continue to hold diuretics.  Magnesium level was 2.1.  Chronic heart failure with preserved ejection fraction (HFpEF) ([place]) Patient was hypovolemic on presentation.  Diuretics on hold.  Continue strict intake and output charting Daily weights. Continue Toprol-XL 100 mg daily   Paroxysmal atrial fibrillation (HCC) Continue Toprol and Coumadin.  Was in sinus rhythm on presentation.   S/P mitral valve replacement with Onyx bileaflet mechanical valve Continue Coumadin.   Hyperlipidemia Continue Lipitor.   Hypertension Continue Toprol-XL, amlodipine.  Holding spironolactone at this time.   Morbid obesity.  Body mass index is 41.2 kg/m. Present on admission.  Would benefit from weight loss as outpatient.  Mild paravertebral spasm.  Add baclofen warm compress.    DVT prophylaxis:  warfarin (COUMADIN) tablet 1 mg  warfarin (COUMADIN) tablet 2 mg   Code Status:     Code Status: Full Code  Disposition: Home likely in 1 to 2 days Status is: Observation  The patient will require care spanning > 2 midnights and should be moved to inpatient because: IV fluids,   Family Communication: None at bedside  Consultants:  None  Procedures:  None  Antimicrobials:  None  Anti-infectives (From admission, onward)    None        Subjective: Today, patient was seen and examined at bedside.  Today patient feels overall better.  Has mild  nausea and was able to eat some pancakes.  Complains of mild spasm over the paravertebral region.  States that she does have some burning, odor with frequency of micturition.  Objective: Vitals:   11/12/21 0642 11/12/21 1115 11/12/21 1116 11/12/21 1129  BP:  (!) 148/71 (!) 148/71    Pulse:  62    Resp:  20    Temp: 98.1 F (36.7 C)   98.3 F (36.8 C)  TempSrc: Oral   Oral  SpO2:  94%    Weight:      Height:       No intake or output data in the 24 hours ending 11/12/21 1138 Filed Weights   11/11/21 0956  Weight: 108.9 kg    Physical Examination: Body mass index is 41.2 kg/m.  General: Morbidly obese built, not in obvious distress HENT:   No scleral pallor or icterus noted. Oral mucosa is moist.  Chest:  Clear breath sounds.  Diminished breath sounds bilaterally. No crackles or wheezes.  Left paravertebral muscle spasm.  Chest wall scar from previous surgery. CVS: S1 &S2 heard. No murmur.  Regular rate and rhythm. Abdomen: Soft, nontender, nondistended.  Bowel sounds are heard.   Extremities: No cyanosis, clubbing or edema.  Peripheral pulses are palpable. Psych: Alert, awake and oriented, normal mood CNS:  No cranial nerve deficits.  Power equal in all extremities.   Skin: Warm and dry.  No rashes noted.  Data Reviewed:   CBC: Recent Labs  Lab 11/11/21 1007 11/12/21 0215  WBC 6.4 7.5  HGB 13.8 11.4*  HCT 40.6 33.6*  MCV 86.4 86.4  PLT 337 381    Basic Metabolic Panel: Recent Labs  Lab 11/11/21 1007 11/11/21 1050 11/12/21 0215  NA 133*  --  129*  K 2.3*  --  2.6*  CL 83*  --  88*  CO2 34*  --  28  GLUCOSE 139*  --  111*  BUN 85*  --  77*  CREATININE 3.31*  --  2.67*  CALCIUM 9.9  --  8.2*  MG  --  2.1  --     Liver Function Tests: Recent Labs  Lab 11/11/21 1007 11/12/21 0215  AST 43* 42*  ALT 38 33  ALKPHOS 127* 96  BILITOT 0.6 0.7  PROT 9.1* 6.9  ALBUMIN 4.6 3.5     Radiology Studies: CT ABDOMEN PELVIS WO CONTRAST  Result Date: 11/11/2021 CLINICAL DATA:  Generalized abdominal pain with nausea. EXAM: CT ABDOMEN AND PELVIS WITHOUT CONTRAST TECHNIQUE: Multidetector CT imaging of the abdomen and pelvis was performed following the standard protocol without IV contrast. RADIATION DOSE REDUCTION: This exam was performed  according to the departmental dose-optimization program which includes automated exposure control, adjustment of the mA and/or kV according to patient size and/or use of iterative reconstruction technique. COMPARISON:  06/13/2021 FINDINGS: Lower chest: No acute findings Hepatobiliary: No suspicious focal abnormality in the liver on this study without intravenous contrast. Gallbladder is surgically absent. No intrahepatic or extrahepatic biliary dilation. Pancreas: No focal mass lesion. No dilatation of the main duct. No intraparenchymal cyst. No peripancreatic edema. Spleen: No splenomegaly. No focal mass lesion. Adrenals/Urinary Tract: No adrenal nodule or mass. Stable small cyst upper pole right kidney. No followup imaging is recommended. 7 mm nonobstructing stone again noted interpolar right kidney. Left kidney unremarkable. No evidence for hydroureter. The urinary bladder appears normal for the degree of distention. Stomach/Bowel: Tiny hiatal hernia. Stomach unremarkable. Duodenum is normally positioned as is the ligament of Treitz.  No small bowel wall thickening. No small bowel dilatation. The terminal ileum is normal. The appendix is normal. No gross colonic mass. No colonic wall thickening. Vascular/Lymphatic: There is moderate atherosclerotic calcification of the abdominal aorta without aneurysm. There is no gastrohepatic or hepatoduodenal ligament lymphadenopathy. No retroperitoneal or mesenteric lymphadenopathy. No pelvic sidewall lymphadenopathy. Reproductive: Uterus surgically absent.  There is no adnexal mass. Other: No intraperitoneal free fluid. Musculoskeletal: Stable appearance of soft tissue attenuation in the subcutaneous fat of the posterolateral left hip region, potentially chronic hematoma/contusion. No worrisome lytic or sclerotic osseous abnormality. IMPRESSION: 1. No acute findings in the abdomen or pelvis. Specifically, no findings to explain the patient's history of abdominal pain with  nausea and vomiting. 2. 7 mm nonobstructing right renal stone. 3. Tiny hiatal hernia. 4. Aortic Atherosclerosis (ICD10-I70.0). Electronically Signed   By: Misty Stanley M.D.   On: 11/11/2021 18:30      LOS: 0 days    Flora Lipps, MD [facility] Available via Epic secure chat 7am-7pm After these hours, please refer to coverage provider listed on [email or web address] 11/12/2021, 11:38 AM

## 2021-11-12 NOTE — ED Notes (Signed)
Ambulates to the restroom at this time

## 2021-11-13 DIAGNOSIS — I5032 Chronic diastolic (congestive) heart failure: Secondary | ICD-10-CM | POA: Diagnosis not present

## 2021-11-13 DIAGNOSIS — E785 Hyperlipidemia, unspecified: Secondary | ICD-10-CM | POA: Diagnosis not present

## 2021-11-13 DIAGNOSIS — N179 Acute kidney failure, unspecified: Secondary | ICD-10-CM | POA: Diagnosis not present

## 2021-11-13 DIAGNOSIS — E876 Hypokalemia: Secondary | ICD-10-CM | POA: Diagnosis not present

## 2021-11-13 LAB — PROTIME-INR
INR: 2 — ABNORMAL HIGH (ref 0.8–1.2)
Prothrombin Time: 22.8 seconds — ABNORMAL HIGH (ref 11.4–15.2)

## 2021-11-13 LAB — BASIC METABOLIC PANEL
Anion gap: 20 — ABNORMAL HIGH (ref 5–15)
BUN: 71 mg/dL — ABNORMAL HIGH (ref 6–20)
CO2: 29 mmol/L (ref 22–32)
Calcium: 9.4 mg/dL (ref 8.9–10.3)
Chloride: 87 mmol/L — ABNORMAL LOW (ref 98–111)
Creatinine, Ser: 2.4 mg/dL — ABNORMAL HIGH (ref 0.44–1.00)
GFR, Estimated: 23 mL/min — ABNORMAL LOW (ref >60)
Glucose, Bld: 128 mg/dL — ABNORMAL HIGH (ref 70–99)
Potassium: 2.5 mmol/L — CL (ref 3.5–5.1)
Sodium: 136 mmol/L (ref 135–145)

## 2021-11-13 LAB — HIV ANTIBODY (ROUTINE TESTING W REFLEX): HIV Screen 4th Generation wRfx: NONREACTIVE

## 2021-11-13 MED ORDER — FLUCONAZOLE 150 MG PO TABS
150.0000 mg | ORAL_TABLET | Freq: Once | ORAL | Status: AC
  Administered 2021-11-13: 150 mg via ORAL
  Filled 2021-11-13: qty 1

## 2021-11-13 MED ORDER — POTASSIUM CHLORIDE 20 MEQ PO PACK
40.0000 meq | PACK | ORAL | Status: AC
  Administered 2021-11-13 (×2): 40 meq via ORAL
  Filled 2021-11-13 (×2): qty 2

## 2021-11-13 MED ORDER — POTASSIUM CHLORIDE 10 MEQ/100ML IV SOLN
10.0000 meq | INTRAVENOUS | Status: AC
  Administered 2021-11-13 (×4): 10 meq via INTRAVENOUS
  Filled 2021-11-13 (×4): qty 100

## 2021-11-13 MED ORDER — WARFARIN SODIUM 2 MG PO TABS
3.0000 mg | ORAL_TABLET | Freq: Once | ORAL | Status: AC
  Administered 2021-11-13: 3 mg via ORAL
  Filled 2021-11-13: qty 1

## 2021-11-13 NOTE — Progress Notes (Signed)
Initial Nutrition Assessment  DOCUMENTATION CODES:   Morbid obesity  INTERVENTION:  - Continue current diet order.   NUTRITION DIAGNOSIS:   Inadequate oral intake related to vomiting, nausea as evidenced by per patient/family report.  GOAL:   Patient will meet greater than or equal to 90% of their needs  MONITOR:   PO intake  REASON FOR ASSESSMENT:   Malnutrition Screening Tool    ASSESSMENT:   56 y.o. female admits related to abdominal pain and nausea. PMH includes: HFpEF, PAF on Coumadin, CKD stage 3, HTN, HLD.  Meds include: Klor-con, warfarin. Labs reviewed: K low, BUN/Crt elevated.   The pt reports that her appetite has improved since admission. The pt reports that for 3-4 days PTA she was experiencing nausea and vomiting. She states that it has subsided for now and she has been able to keep her food down. Pt states that she was following a heart healthy diet PTA and declines diet education for now. RD will continue to monitor diet tolerance.   NUTRITION - FOCUSED PHYSICAL EXAM:  Flowsheet Row Most Recent Value  Orbital Region No depletion  Upper Arm Region No depletion  Thoracic and Lumbar Region No depletion  Buccal Region No depletion  Temple Region No depletion  Clavicle Bone Region No depletion  Clavicle and Acromion Bone Region No depletion  Scapular Bone Region No depletion  Dorsal Hand No depletion  Patellar Region No depletion  Anterior Thigh Region No depletion  Posterior Calf Region No depletion  Edema (RD Assessment) None  Hair Reviewed  Eyes Reviewed  Mouth Reviewed  Skin Reviewed  Nails Reviewed       Diet Order:   Diet Order             Diet Heart Room service appropriate? Yes; Fluid consistency: Thin  Diet effective now                   EDUCATION NEEDS:   Education needs have been addressed  Skin:  Skin Assessment: Reviewed RN Assessment  Last BM:  11/12/21  Height:   Ht Readings from Last 1 Encounters:  11/12/21  '5\' 4"'$  (1.626 m)    Weight:   Wt Readings from Last 1 Encounters:  11/12/21 110 kg    Ideal Body Weight:  54.5 kg  BMI:  Body mass index is 41.63 kg/m.  Estimated Nutritional Needs:   Kcal:  2200-2750 kcals  Protein:  110-135 gm  Fluid:  >/= 2.2 L  Thalia Bloodgood, RD, LDN, CNSC

## 2021-11-13 NOTE — Progress Notes (Signed)
ANTICOAGULATION CONSULT NOTE   Pharmacy Consult for warfarin Indication: mechanical mitral valve and atrial fibrillation s/p maze procedure  Allergies  Allergen Reactions   Morphine Hives and Rash   Oxycodone Hcl Hives and Itching   Dilaudid [Hydromorphone Hcl] Itching    Patient Measurements: Height: _0  (162.6 cm) Weight: 110 kg (242 lb 8 oz) IBW/kg (Calculated) : 54.7  Vital Signs: Temp: 97.6 F (36.4 C) (10/27 0350) Temp Source: Oral (10/27 0350) BP: 145/68 (10/27 0839) Pulse Rate: 72 (10/27 0350)  Labs: Recent Labs    11/11/21 1007 11/12/21 0018 11/12/21 0215 11/13/21 0338 11/13/21 0538  HGB 13.8  --  11.4*  --   --   HCT 40.6  --  33.6*  --   --   PLT 337  --  286  --   --   LABPROT  --  30.2*  --  22.8*  --   INR  --  2.9*  --  2.0*  --   CREATININE 3.31*  --  2.67*  --  2.40*     Estimated Creatinine Clearance: 32.1 mL/min (A) (by C-G formula based on SCr of 2.4 mg/dL (H)).   Medical History: Past Medical History:  Diagnosis Date   (HFpEF) heart failure with preserved ejection fraction ([place])    a. 08/2017 Echo: EF 55-60%.  Grade 2 diastolic dysfunction; b. 05/4648 Echo: EF 50-55%, no rwma, Nl RV fxn, nl fxn'ing mech MV.   Allergy    Anemia    Anxiety    BRCA negative 03/22/2013   Bronchitis 02/19/2016   ON LEVAQUIN PO   Chest tightness    a. 08/2019 Cath: nl cors.   Cigarette smoker 09/11/2017   8-10 day   Dysrhythmia    GERD (gastroesophageal reflux disease)    History of kidney stones    Hyperlipidemia    Hypertension    Interstitial lung disease ([place])    a. CT 2013 b. 02/2018 CXR noted recurrent intersistial changes ILD vs chronic bronchitis   Moderate mitral stenosis    a.  08/2017 TEE: EF 60 to 65%.  Moderate mitral stenosis.  Mean gradient 14 mmHg.  Valve area 2.59 cm by planimetry, 2.72 cm by pressure half-time.   PAF (paroxysmal atrial fibrillation) ([place])    a. 08/2017 s/p TEE/DCCV; b. CHA2DS2VASc = 2-->warfarin.   Pneumonia    S/P  Maze operation for atrial fibrillation 04/29/2020   Complete bilateral atrial lesion set using bipolar radiofrequency and cryothermy ablation with clipping of LA appendage   S/P mitral valve replacement with Onyx bileaflet mechanical valve 04/29/2020   a. 04/2020 s/p 27/29 mm Onyx mech mitral valve-->chronic coumadin; b. 05/2020 Echo: Nl fxn'ing mech MV.    Assessment: 56 y/o female presented to the ED for abdominal pain and nausea. Pt is on warfarin PTA for mechanical mitral valve and afib. Pharmacy consulted to resume warfarin. Patient reports regimen is 2 mg every day except 1 mg on SuTh; which is different from outpatient records. I did clarify that the regimen on the med history is how she currently takes her warfarin.   INR down to 2.0 this morning, now below goal. No bleeding issues noted, no cbc done.   Goal of Therapy:  INR 2.5-3.5 Monitor platelets by anticoagulation protocol: Yes   Plan:  Warfarin 32m PO x 1  Monitor daily INR, s/sx bleeding  FErin HearingPharmD., BCPS Clinical Pharmacist 11/13/2021 1:20 PM

## 2021-11-13 NOTE — Progress Notes (Addendum)
PROGRESS NOTE    Leslie [name]  YCX:[id number] DOB: 1965/12/21 DOA: 11/11/2021 PCP: [name], PA-C    Brief Narrative:  Leslie [name] is a 56 years old female with medical history significant for HFpEF (EF 60-65% 08/04/2021), PAF on Coumadin, mitral stenosis s/p mechanical MVR and Maze procedure (04/2020), CKD stage IIIb, hypertension, hyperlipidemia presented to hospital with abdominal pain nausea decreased urinary output.  Patient is on torsemide daily at home.  In the ED, patient had stable vitals.  Labs showed hypokalemia with potassium of 2.3, creatinine was elevated at 3.3 from baseline 1.4-1.6, lipase was 28.  WBC 6.4.  CT scan of the abdomen pelvis was negative for acute findings except for 7 mm nonobstructive right renal stone.  Patient was given IV fluids and runs of potassium and was admitted hospital for further evaluation and treatment.   Assessment and Plan:  Principal Problem:   Acute renal failure superimposed on stage 3b chronic kidney disease (HCC) Active Problems:   Hypokalemia   Prolonged QT interval   Chronic heart failure with preserved ejection fraction (HFpEF) (HCC)   Paroxysmal atrial fibrillation (HCC)   S/P mitral valve replacement with Onyx bileaflet mechanical valve   Hypertension   Hyperlipidemia   AKI (acute kidney injury) ([place])  Acute renal failure superimposed on stage 3b chronic kidney disease (HCC) Creatinine on presentation was 3.3 compared to baseline around 1.4-1.6.  She was on diuretics and was hypovolemic on presentation. Continue to hold torsemide and spironolactone.  CT abdomen and pelvis shows a nonobstructing 7 mm right renal stone without other acute findings or hydronephrosis.  Creatinine today at 2.4.  Received IV fluids during hospitalization.  We will hold off with IV fluids today.  Patient is positive balance for 1349 mL.  Trend BMP.  Abdominal pain, nausea, frequent urination with burning micturition.  Improved.  CT scan of  the abdomen showed renal stone otherwise no acute findings.  Lipase was 28.  Continue supportive care.   Urinalysis was negative for nitrites and leukocytes.  No indication for antibiotic.  Prolonged QT interval Likely secondary to hypokalemia.  We will aggressively replace.  Telemetry monitor.   Hypokalemia Potassium level was 2.3 admission.  Potassium of 2.5 today despite aggressive replacement.  We will continue to replace through IV and orally.  Check levels in AM.  Hyponatremia.  Mild.  Improved.  Chronic heart failure with preserved ejection fraction (HFpEF) ([place]) Patient was hypovolemic on presentation.  Diuretics on hold.  Continue strict intake and output charting Daily weights. Continue Toprol-XL 100 mg daily   Paroxysmal atrial fibrillation (HCC) Continue Toprol and Coumadin.  Was in sinus rhythm on presentation.   S/P mitral valve replacement with Onyx bileaflet mechanical valve Continue Coumadin.   Hyperlipidemia Continue Lipitor.   Hypertension Continue Toprol-XL, amlodipine.  Holding spironolactone at this time.   Morbid obesity.  Body mass index is 41.63 kg/m. Present on admission.  Would benefit from weight loss as outpatient.  Mild paravertebral spasm.  On baclofen and warm compression.  Little better.    DVT prophylaxis:    Code Status:     Code Status: Full Code  Disposition: Home likely in 1 to 2 days when electrolyte and renal function continues to improve.  Status is: inpatient.  The patient is inpatient because: Electrolyte imbalance, renal failure.   Family Communication:  None at bedside  Consultants:  None  Procedures:  None  Antimicrobials:  None  Anti-infectives (From admission, onward)    None  Subjective: Today, patient was seen and examined at bedside.  Feels okay.  No nausea or vomiting today.  Has not had a bowel movement.  Eating okay.   Objective: Vitals:   11/12/21 1804 11/13/21 0039 11/13/21 0350 11/13/21 0839   BP: (!) 140/75 (!) 141/62 138/79 (!) 145/68  Pulse: 70 62 72   Resp:  20 20   Temp:  98.5 F (36.9 C) 97.6 F (36.4 C)   TempSrc:  Oral Oral   SpO2:  99% 98%   Weight:      Height:        Intake/Output Summary (Last 24 hours) at 11/13/2021 1311 Last data filed at 11/12/2021 1604 Gross per 24 hour  Intake 195.27 ml  Output --  Net 195.27 ml   Filed Weights   11/11/21 0956 11/12/21 1449  Weight: 108.9 kg 110 kg    Physical Examination: Body mass index is 41.63 kg/m.   General: Morbidly obese built, not in obvious distress HENT:   No scleral pallor or icterus noted. Oral mucosa is moist.  Chest:  Clear breath sounds.  Diminished breath sounds bilaterally. No crackles or wheezes.  Chest wall scar CVS: S1 &S2 heard. No murmur.  Regular rate and rhythm. Abdomen: Soft, nontender, nondistended.  Bowel sounds are heard.  Extremities: No cyanosis, clubbing or edema.  Peripheral pulses are palpable. Psych: Alert, awake and oriented, normal mood CNS:  No cranial nerve deficits.  Power equal in all extremities.   Skin: Warm and dry.  No rashes noted.  Data Reviewed:   CBC: Recent Labs  Lab 11/11/21 1007 11/12/21 0215  WBC 6.4 7.5  HGB 13.8 11.4*  HCT 40.6 33.6*  MCV 86.4 86.4  PLT 337 286     Basic Metabolic Panel: Recent Labs  Lab 11/11/21 1007 11/11/21 1050 11/12/21 0215 11/13/21 0538  NA 133*  --  129* 136  K 2.3*  --  2.6* 2.5*  CL 83*  --  88* 87*  CO2 34*  --  28 29  GLUCOSE 139*  --  111* 128*  BUN 85*  --  77* 71*  CREATININE 3.31*  --  2.67* 2.40*  CALCIUM 9.9  --  8.2* 9.4  MG  --  2.1  --   --      Liver Function Tests: Recent Labs  Lab 11/11/21 1007 11/12/21 0215  AST 43* 42*  ALT 38 33  ALKPHOS 127* 96  BILITOT 0.6 0.7  PROT 9.1* 6.9  ALBUMIN 4.6 3.5      Radiology Studies: CT ABDOMEN PELVIS WO CONTRAST  Result Date: 11/11/2021 CLINICAL DATA:  Generalized abdominal pain with nausea. EXAM: CT ABDOMEN AND PELVIS WITHOUT  CONTRAST TECHNIQUE: Multidetector CT imaging of the abdomen and pelvis was performed following the standard protocol without IV contrast. RADIATION DOSE REDUCTION: This exam was performed according to the departmental dose-optimization program which includes automated exposure control, adjustment of the mA and/or kV according to patient size and/or use of iterative reconstruction technique. COMPARISON:  06/13/2021 FINDINGS: Lower chest: No acute findings Hepatobiliary: No suspicious focal abnormality in the liver on this study without intravenous contrast. Gallbladder is surgically absent. No intrahepatic or extrahepatic biliary dilation. Pancreas: No focal mass lesion. No dilatation of the main duct. No intraparenchymal cyst. No peripancreatic edema. Spleen: No splenomegaly. No focal mass lesion. Adrenals/Urinary Tract: No adrenal nodule or mass. Stable small cyst upper pole right kidney. No followup imaging is recommended. 7 mm nonobstructing stone again noted interpolar right kidney. Left  kidney unremarkable. No evidence for hydroureter. The urinary bladder appears normal for the degree of distention. Stomach/Bowel: Tiny hiatal hernia. Stomach unremarkable. Duodenum is normally positioned as is the ligament of Treitz. No small bowel wall thickening. No small bowel dilatation. The terminal ileum is normal. The appendix is normal. No gross colonic mass. No colonic wall thickening. Vascular/Lymphatic: There is moderate atherosclerotic calcification of the abdominal aorta without aneurysm. There is no gastrohepatic or hepatoduodenal ligament lymphadenopathy. No retroperitoneal or mesenteric lymphadenopathy. No pelvic sidewall lymphadenopathy. Reproductive: Uterus surgically absent.  There is no adnexal mass. Other: No intraperitoneal free fluid. Musculoskeletal: Stable appearance of soft tissue attenuation in the subcutaneous fat of the posterolateral left hip region, potentially chronic hematoma/contusion. No  worrisome lytic or sclerotic osseous abnormality. IMPRESSION: 1. No acute findings in the abdomen or pelvis. Specifically, no findings to explain the patient's history of abdominal pain with nausea and vomiting. 2. 7 mm nonobstructing right renal stone. 3. Tiny hiatal hernia. 4. Aortic Atherosclerosis (ICD10-I70.0). Electronically Signed   By: Misty Stanley M.D.   On: 11/11/2021 18:30      LOS: 1 day    Flora Lipps, MD [facility] Available via Epic secure chat 7am-7pm After these hours, please refer to coverage provider listed on [email or web address] 11/13/2021, 1:11 PM

## 2021-11-13 NOTE — Progress Notes (Signed)
Pt's K+ 2.5 this a.m; Marlowe Sax, MD notified & awaiting new orders.   Elaina Hoops, RN

## 2021-11-14 DIAGNOSIS — N1832 Chronic kidney disease, stage 3b: Secondary | ICD-10-CM | POA: Diagnosis not present

## 2021-11-14 DIAGNOSIS — N179 Acute kidney failure, unspecified: Secondary | ICD-10-CM | POA: Diagnosis not present

## 2021-11-14 LAB — CBC
HCT: 36.9 % (ref 36.0–46.0)
Hemoglobin: 12.4 g/dL (ref 12.0–15.0)
MCH: 29.3 pg (ref 26.0–34.0)
MCHC: 33.6 g/dL (ref 30.0–36.0)
MCV: 87.2 fL (ref 80.0–100.0)
Platelets: 286 10*3/uL (ref 150–400)
RBC: 4.23 MIL/uL (ref 3.87–5.11)
RDW: 12.5 % (ref 11.5–15.5)
WBC: 7.6 10*3/uL (ref 4.0–10.5)
nRBC: 0 % (ref 0.0–0.2)

## 2021-11-14 LAB — BASIC METABOLIC PANEL
Anion gap: 13 (ref 5–15)
BUN: 64 mg/dL — ABNORMAL HIGH (ref 6–20)
CO2: 30 mmol/L (ref 22–32)
Calcium: 8.9 mg/dL (ref 8.9–10.3)
Chloride: 92 mmol/L — ABNORMAL LOW (ref 98–111)
Creatinine, Ser: 2.44 mg/dL — ABNORMAL HIGH (ref 0.44–1.00)
GFR, Estimated: 23 mL/min — ABNORMAL LOW (ref >60)
Glucose, Bld: 118 mg/dL — ABNORMAL HIGH (ref 70–99)
Potassium: 2.4 mmol/L — CL (ref 3.5–5.1)
Sodium: 135 mmol/L (ref 135–145)

## 2021-11-14 LAB — MAGNESIUM: Magnesium: 2 mg/dL (ref 1.7–2.4)

## 2021-11-14 LAB — PROTIME-INR
INR: 1.9 — ABNORMAL HIGH (ref 0.8–1.2)
Prothrombin Time: 22 seconds — ABNORMAL HIGH (ref 11.4–15.2)

## 2021-11-14 MED ORDER — SODIUM CHLORIDE 0.9 % IV SOLN
INTRAVENOUS | Status: DC
  Filled 2021-11-14: qty 1000

## 2021-11-14 MED ORDER — POTASSIUM CHLORIDE 10 MEQ/100ML IV SOLN
10.0000 meq | INTRAVENOUS | Status: DC
  Administered 2021-11-14: 10 meq via INTRAVENOUS
  Filled 2021-11-14: qty 100

## 2021-11-14 MED ORDER — POTASSIUM CHLORIDE 20 MEQ PO PACK
40.0000 meq | PACK | Freq: Once | ORAL | Status: AC
  Administered 2021-11-14: 40 meq via ORAL
  Filled 2021-11-14: qty 2

## 2021-11-14 MED ORDER — POTASSIUM CHLORIDE CRYS ER 20 MEQ PO TBCR
60.0000 meq | EXTENDED_RELEASE_TABLET | Freq: Two times a day (BID) | ORAL | Status: DC
  Administered 2021-11-14 – 2021-11-16 (×5): 60 meq via ORAL
  Filled 2021-11-14 (×5): qty 3

## 2021-11-14 MED ORDER — WARFARIN SODIUM 2 MG PO TABS
3.0000 mg | ORAL_TABLET | Freq: Once | ORAL | Status: AC
  Administered 2021-11-14: 3 mg via ORAL
  Filled 2021-11-14: qty 1

## 2021-11-14 MED ORDER — SODIUM CHLORIDE 0.9 % IV SOLN
INTRAVENOUS | Status: AC
  Filled 2021-11-14: qty 1000

## 2021-11-14 NOTE — Progress Notes (Signed)
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 on chronic Coumadin (2.5-3.5) Permanent A-fib status post Maze procedure + left atrial appendage clip CKD 3 AA Reflux HTN ILD Seen at cardiology office [place] 10/16/2021 for shortness of breath-->started metolazone 5 torsemide 40 and on review on 10/12 still found to be volume up with chest pain-work-up in emergency room f and sent home with diagnosis of reflux sent on Carafate and told to take potassium and azithromycin Return 10/25 with oliguria mild abdominal pain small amounts of urination nausea-potassium found to be 2.3 creatinine 3.3 Admitted for AKI hypokalemia  Hospital-Problem based course  ATN from meds causing AKI superimposed on CKD 3--baseline creatinine 1.6 PTA Aldactone 12.5 torsemide 20 and Demadex 20 all on hold  given fluids initially and then held-resumed today Her oliguria has resolved and she is passing better urine  for the past 48 hours Creatinine hopefully is resolving  Cardiorenal syndrome?   HFpEF EF 60-65% 08/04/2021 Prior mitral valve repair 05/07/2020 Volume up 1.7 L--last office weight 112 kg today is 110 Cautious volume repletion as below Creatinine still up and not resolving would repeat echo in a.m. and ask cardiology and opinion  Persistent severe hypokalemia Given 35mq of K and IV fluid today and stopped after 10 hours Replace with K. Dur 60 twice daily Magnesium appropriate recheck in a.m. If further worsening etc. will need discussion with nephrology  Abdominal pain Known history of kidney stones CT scan without contrast 7 mm right kidney stone Monitor trends  Permanent A-fib on Coumadin CHA2DS2-VASc >4 Continue metoprolol XL 100 daily, Coumadin  per pharmacy for goal above 2.5 given above  HTN I HLD  Continue amlodipine 10, atorvastatin, aspirin  BMI 41  DVT prophylaxis: On Coumadin Code Status: Full Family Communication: Discussed with husband bedside Disposition:  Status is: Inpatient Remains inpatient appropriate because:   Electrolyte disturbances remain   Consultants:  None yet  Procedures:   Antimicrobials:     Subjective: Awake coherent no distress states passing more urine  worried about potassium and whether "something was missed" No fever no chills  Objective: Vitals:   11/13/21 2240 11/14/21 0349 11/14/21 0700 11/14/21 0913  BP: 139/69 137/65 135/81 131/60  Pulse: 60 62 67 62  Resp: '19 18 15 18  '$ Temp: 98.3 F (36.8 C) 97.9 F (36.6 C) 98.2 F (36.8 C) 97.9 F (36.6 C)  TempSrc: Oral Oral Oral Oral  SpO2: 99% 98% 99% 99%  Weight:      Height:       No intake or output data in the 24 hours ending 11/14/21 1529 Filed Weights   11/11/21 0956 11/12/21 1449  Weight: 108.9 kg 110 kg    Examination:  EOMI NCAT no focal deficit thick neck Mallampati 4 JVD is up CTA B no added sound no rales or rhonchi S1-S2 murmur noted Sinus rhythm Abdomen soft no rebound no guarding obese nontender No lower extremity edema Neurologically intact moving 4 limbs equally  Data Reviewed: personally reviewed   CBC    Component Value Date/Time   WBC 7.6 11/14/2021 0153   RBC 4.23 11/14/2021 0153   HGB 12.4 11/14/2021 0153   HGB 10.7 (L) 07/07/2020 0808   HCT 36.9 11/14/2021 0153   HCT 32.2 (L) 07/07/2020 0808   PLT 286 11/14/2021  0153   PLT 350 07/07/2020 0808   MCV 87.2 11/14/2021 0153   MCV 87 07/07/2020 0808   MCV 91 04/05/2012 0827   MCH 29.3 11/14/2021 0153   MCHC 33.6 11/14/2021 0153   RDW 12.5 11/14/2021 0153   RDW 13.4 07/07/2020 0808   RDW 13.2 04/05/2012 0827   LYMPHSABS 1.8 10/22/2020 1300   LYMPHSABS 1.8 07/07/2020 0808   MONOABS 0.7 10/22/2020 1300   EOSABS 0.1 10/22/2020 1300    EOSABS 0.2 07/07/2020 0808   BASOSABS 0.1 10/22/2020 1300   BASOSABS 0.0 07/07/2020 0808      Latest Ref Rng & Units 11/14/2021    1:53 AM 11/13/2021    5:38 AM 11/12/2021    2:15 AM  CMP  Glucose 70 - 99 mg/dL 118  128  111   BUN 6 - 20 mg/dL 64  71  77   Creatinine 0.44 - 1.00 mg/dL 2.44  2.40  2.67   Sodium 135 - 145 mmol/L 135  136  129   Potassium 3.5 - 5.1 mmol/L 2.4  2.5  2.6   Chloride 98 - 111 mmol/L 92  87  88   CO2 22 - 32 mmol/L '30  29  28   '$ Calcium 8.9 - 10.3 mg/dL 8.9  9.4  8.2   Total Protein 6.5 - 8.1 g/dL   6.9   Total Bilirubin 0.3 - 1.2 mg/dL   0.7   Alkaline Phos 38 - 126 U/L   96   AST 15 - 41 U/L   42   ALT 0 - 44 U/L   33      Radiology Studies: No results found.   Scheduled Meds:  amLODipine  10 mg Oral Daily   atorvastatin  80 mg Oral Daily   metoprolol succinate  100 mg Oral QPM   pantoprazole  40 mg Oral Daily   potassium chloride  60 mEq Oral BID   sodium chloride flush  3 mL Intravenous Q12H   warfarin  3 mg Oral ONCE-1600   Warfarin - Pharmacist Dosing Inpatient   Does not apply q1600   Continuous Infusions:  sodium chloride 0.9 % 1,000 mL with potassium chloride 80 mEq infusion       LOS: 2 days   Time spent: [place], MD [facility] To contact the attending provider between 7A-7P or the covering provider during after hours 7P-7A, please log into the web site [email or web address] and access using universal [place] password for that web site. If you do not have the password, please call the hospital operator.  11/14/2021, 3:29 PM

## 2021-11-14 NOTE — Progress Notes (Signed)
Pt's K+ 2.4 this a.m. Marlowe Sax, MD paged. Awaiting further orders.   Elaina Hoops, RN

## 2021-11-14 NOTE — Progress Notes (Signed)
ANTICOAGULATION CONSULT NOTE   Pharmacy Consult for warfarin Indication: mechanical mitral valve and atrial fibrillation s/p maze procedure  Allergies  Allergen Reactions   Morphine Hives and Rash   Oxycodone Hcl Hives and Itching   Dilaudid [Hydromorphone Hcl] Itching    Patient Measurements: Height: _0  (162.6 cm) Weight: 110 kg (242 lb 8 oz) IBW/kg (Calculated) : 54.7  Vital Signs: Temp: 97.9 F (36.6 C) (10/28 0349) Temp Source: Oral (10/28 0349) BP: 137/65 (10/28 0349) Pulse Rate: 62 (10/28 0349)  Labs: Recent Labs    11/11/21 1007 11/12/21 0018 11/12/21 0215 11/13/21 0338 11/13/21 0538 11/14/21 0153  HGB 13.8  --  11.4*  --   --  12.4  HCT 40.6  --  33.6*  --   --  36.9  PLT 337  --  286  --   --  286  LABPROT  --  30.2*  --  22.8*  --  22.0*  INR  --  2.9*  --  2.0*  --  1.9*  CREATININE 3.31*  --  2.67*  --  2.40* 2.44*     Estimated Creatinine Clearance: 31.6 mL/min (A) (by C-G formula based on SCr of 2.44 mg/dL (H)).   Medical History: Past Medical History:  Diagnosis Date   (HFpEF) heart failure with preserved ejection fraction ([place])    a. 08/2017 Echo: EF 55-60%.  Grade 2 diastolic dysfunction; b. 02/2631 Echo: EF 50-55%, no rwma, Nl RV fxn, nl fxn'ing mech MV.   Allergy    Anemia    Anxiety    BRCA negative 03/22/2013   Bronchitis 02/19/2016   ON LEVAQUIN PO   Chest tightness    a. 08/2019 Cath: nl cors.   Cigarette smoker 09/11/2017   8-10 day   Dysrhythmia    GERD (gastroesophageal reflux disease)    History of kidney stones    Hyperlipidemia    Hypertension    Interstitial lung disease ([place])    a. CT 2013 b. 02/2018 CXR noted recurrent intersistial changes ILD vs chronic bronchitis   Moderate mitral stenosis    a.  08/2017 TEE: EF 60 to 65%.  Moderate mitral stenosis.  Mean gradient 14 mmHg.  Valve area 2.59 cm by planimetry, 2.72 cm by pressure half-time.   PAF (paroxysmal atrial fibrillation) ([place])    a. 08/2017 s/p TEE/DCCV; b.  CHA2DS2VASc = 2-->warfarin.   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 04/2020 s/p 27/29 mm Onyx mech mitral valve-->chronic coumadin; b. 05/2020 Echo: Nl fxn'ing mech MV.    Assessment: 56 y/o female presented to the ED for abdominal pain and nausea. Pt is on warfarin PTA for mechanical mitral valve and afib. Pharmacy consulted to resume warfarin. Patient reports regimen is 2 mg every day except 1 mg on SuTh; which is different from outpatient records. This regimen was confirmed with the patient.   INR is subtherapeutic this morning at 1.9. No signs or symptoms of bleeding noted, CBC is WNL.   Goal of Therapy:  INR 2.5-3.5 Monitor platelets by anticoagulation protocol: Yes   Plan:  Give Warfarin 84m PO x 1  Monitor daily INR, signs and symptoms of bleeding  ELouanne Belton PharmD, MSouth [facility] [id number] Pharmacy Resident 11/14/2021 7:38 AM

## 2021-11-15 DIAGNOSIS — N179 Acute kidney failure, unspecified: Secondary | ICD-10-CM | POA: Diagnosis not present

## 2021-11-15 DIAGNOSIS — N1832 Chronic kidney disease, stage 3b: Secondary | ICD-10-CM | POA: Diagnosis not present

## 2021-11-15 LAB — COMPREHENSIVE METABOLIC PANEL
ALT: 43 U/L (ref 0–44)
AST: 37 U/L (ref 15–41)
Albumin: 3.5 g/dL (ref 3.5–5.0)
Alkaline Phosphatase: 95 U/L (ref 38–126)
Anion gap: 8 (ref 5–15)
BUN: 53 mg/dL — ABNORMAL HIGH (ref 6–20)
CO2: 26 mmol/L (ref 22–32)
Calcium: 8.7 mg/dL — ABNORMAL LOW (ref 8.9–10.3)
Chloride: 105 mmol/L (ref 98–111)
Creatinine, Ser: 2.31 mg/dL — ABNORMAL HIGH (ref 0.44–1.00)
GFR, Estimated: 24 mL/min — ABNORMAL LOW (ref >60)
Glucose, Bld: 142 mg/dL — ABNORMAL HIGH (ref 70–99)
Potassium: 3.5 mmol/L (ref 3.5–5.1)
Sodium: 139 mmol/L (ref 135–145)
Total Bilirubin: 0.5 mg/dL (ref 0.3–1.2)
Total Protein: 7.2 g/dL (ref 6.5–8.1)

## 2021-11-15 LAB — CBC
HCT: 30.8 % — ABNORMAL LOW (ref 36.0–46.0)
Hemoglobin: 10.2 g/dL — ABNORMAL LOW (ref 12.0–15.0)
MCH: 29.5 pg (ref 26.0–34.0)
MCHC: 33.1 g/dL (ref 30.0–36.0)
MCV: 89 fL (ref 80.0–100.0)
Platelets: 251 10*3/uL (ref 150–400)
RBC: 3.46 MIL/uL — ABNORMAL LOW (ref 3.87–5.11)
RDW: 12.6 % (ref 11.5–15.5)
WBC: 8.3 10*3/uL (ref 4.0–10.5)
nRBC: 0 % (ref 0.0–0.2)

## 2021-11-15 LAB — MAGNESIUM: Magnesium: 1.8 mg/dL (ref 1.7–2.4)

## 2021-11-15 LAB — PROTIME-INR
INR: 2.2 — ABNORMAL HIGH (ref 0.8–1.2)
Prothrombin Time: 24.3 seconds — ABNORMAL HIGH (ref 11.4–15.2)

## 2021-11-15 MED ORDER — TORSEMIDE 20 MG PO TABS
20.0000 mg | ORAL_TABLET | Freq: Two times a day (BID) | ORAL | Status: DC
  Administered 2021-11-15 – 2021-11-16 (×2): 20 mg via ORAL
  Filled 2021-11-15 (×2): qty 1

## 2021-11-15 MED ORDER — WARFARIN SODIUM 2 MG PO TABS
3.0000 mg | ORAL_TABLET | Freq: Once | ORAL | Status: AC
  Administered 2021-11-15: 3 mg via ORAL
  Filled 2021-11-15: qty 1

## 2021-11-15 NOTE — Progress Notes (Signed)
[place] for warfarin Indication: mechanical mitral valve and atrial fibrillation s/p maze procedure  Allergies  Allergen Reactions   Morphine Hives and Rash   Oxycodone Hcl Hives and Itching   Dilaudid [Hydromorphone Hcl] Itching    Patient Measurements: Height: _0  (162.6 cm) Weight: 113.1 kg (249 lb 5.4 oz) IBW/kg (Calculated) : 54.7  Vital Signs: Temp: 98.3 F (36.8 C) (10/29 0757) Temp Source: Oral (10/29 0757) BP: 141/60 (10/29 0757) Pulse Rate: 72 (10/29 0757)  Labs: Recent Labs    11/13/21 0338 11/13/21 0538 11/14/21 0153 11/15/21 0140  HGB  --   --  12.4 10.2*  HCT  --   --  36.9 30.8*  PLT  --   --  286 251  LABPROT 22.8*  --  22.0* 24.3*  INR 2.0*  --  1.9* 2.2*  CREATININE  --  2.40* 2.44* 2.31*     Estimated Creatinine Clearance: 33.9 mL/min (A) (by C-G formula based on SCr of 2.31 mg/dL (H)).   Medical History: Past Medical History:  Diagnosis Date   (HFpEF) heart failure with preserved ejection fraction ([place])    a. 08/2017 Echo: EF 55-60%.  Grade 2 diastolic dysfunction; b. 01/6107 Echo: EF 50-55%, no rwma, Nl RV fxn, nl fxn'ing mech MV.   Allergy    Anemia    Anxiety    BRCA negative 03/22/2013   Bronchitis 02/19/2016   ON LEVAQUIN PO   Chest tightness    a. 08/2019 Cath: nl cors.   Cigarette smoker 09/11/2017   8-10 day   Dysrhythmia    GERD (gastroesophageal reflux disease)    History of kidney stones    Hyperlipidemia    Hypertension    Interstitial lung disease ([place])    a. CT 2013 b. 02/2018 CXR noted recurrent intersistial changes ILD vs chronic bronchitis   Moderate mitral stenosis    a.  08/2017 TEE: EF 60 to 65%.  Moderate mitral stenosis.  Mean gradient 14 mmHg.  Valve area 2.59 cm by planimetry, 2.72 cm by pressure half-time.   PAF (paroxysmal atrial fibrillation) ([place])    a. 08/2017 s/p TEE/DCCV; b. CHA2DS2VASc = 2-->warfarin.   Pneumonia    S/P Maze operation for atrial fibrillation  04/29/2020   Complete bilateral atrial lesion set using bipolar radiofrequency and cryothermy ablation with clipping of LA appendage   S/P mitral valve replacement with Onyx bileaflet mechanical valve 04/29/2020   a. 04/2020 s/p 27/29 mm Onyx mech mitral valve-->chronic coumadin; b. 05/2020 Echo: Nl fxn'ing mech MV.    Assessment: 56 y/o female presented to the ED for abdominal pain and nausea. Pt is on warfarin PTA for mechanical mitral valve and afib. Pharmacy consulted to resume warfarin. Patient reports regimen is 2 mg every day except 1 mg on SuTh; which is different from outpatient records. This regimen was confirmed with the patient.   INR is subtherapeutic this morning at 2.2. No signs or symptoms of bleeding noted, Hgb 10.2, plt wnl.  Goal of Therapy:  INR 2.5-3.5 Monitor platelets by anticoagulation protocol: Yes   Plan:  Give Warfarin 42m PO x 1  Monitor daily INR, signs and symptoms of bleeding  ELouanne Belton PharmD, MConne[facility] [id number] Pharmacy Resident 11/15/2021 8:34 AM

## 2021-11-15 NOTE — Progress Notes (Signed)
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 on chronic Coumadin (2.5-3.5) Permanent A-fib status post Maze procedure + left atrial appendage clip CKD 3 AA Reflux HTN ILD Seen at cardiology office [place] 10/16/2021 for shortness of breath-->started metolazone 5 torsemide 40 and on review on 10/12 still found to be volume up with chest pain-work-up in emergency room f and sent home with diagnosis of reflux sent on Carafate and told to take potassium and azithromycin Return 10/25 with oliguria mild abdominal pain small amounts of urination nausea-potassium found to be 2.3 creatinine 3.3 Admitted for AKI hypokalemia  Hospital-Problem based course  ATN from meds causing AKI superimposed on CKD 3--baseline creatinine 1.6 PTA Aldactone 12.5 torsemide 20 and Demadex 20 all on hold  given fluids initially and transiently to replace potassium Her oliguria has resolved and she is passing better urine for the past 48 hours Creatinine may be a new baseline for her  Cardiorenal syndrome?   HFpEF EF 60-65% 08/04/2021 Prior mitral valve repair 05/07/2020 Volume up 2.3 L--last office weight 112 kg today is 113 We will resume cautiously torsemide 20 twice daily  Persistent severe hypokalemia Replaced with IV fluid +80 of K as well as 60 twice daily orally--continuing oral replacement Magnesium reasonable at 1.8  Abdominal pain Known history of kidney stones CT scan without contrast 7 mm right kidney stone Monitor trends  Permanent A-fib on Coumadin CHA2DS2-VASc >4 Continue metoprolol XL 100 daily, Coumadin subtherapeutic 2.2-goal 2.5-3.5   HTN I HLD  Continue amlodipine 10, atorvastatin, aspirin  BMI 41  DVT prophylaxis: On Coumadin Code  Status: Full Family Communication: Discussed with husband bedside Disposition:  Status is: Inpatient Remains inpatient appropriate because:   Electrolyte disturbances remain   Consultants:  None yet  Procedures:   Antimicrobials:     Subjective:  Looks comfortable no distress Asking to go home as has appointment early tomorrow Mentioned we need to check potassium as well as kidney function etc. tomorrow she understands  Objective: Vitals:   11/14/21 1918 11/15/21 0004 11/15/21 0419 11/15/21 0757  BP: (!) 146/61 (!) 140/62  (!) 141/60  Pulse:    72  Resp: '18 18  20  '$ Temp: 98.6 F (37 C) 97.8 F (36.6 C) 98.6 F (37 C) 98.3 F (36.8 C)  TempSrc:  Oral Oral Oral  SpO2: 99% 99% 100%   Weight:   113.1 kg   Height:        Intake/Output Summary (Last 24 hours) at 11/15/2021 1039 Last data filed at 11/14/2021 2100 Gross per 24 hour  Intake 598.66 ml  Output --  Net 598.66 ml   Filed Weights   11/11/21 0956 11/12/21 1449 11/15/21 0419  Weight: 108.9 kg 110 kg 113.1 kg    Examination:  Awake coherent pleasant no distress EOMI NCAT no focal deficit no icterus no pallor Mild JVD with hepatojugular reflux but this seems a little less than yesterday Abdomen is obese nontender Trace lower extremity edema Neurologically intact moving 4 limbs equally  Data Reviewed: personally reviewed   CBC    Component Value Date/Time   WBC 8.3 11/15/2021 0140   RBC 3.46 (L) 11/15/2021 0140   HGB 10.2 (L) 11/15/2021 0140   HGB 10.7 (L) 07/07/2020 0808   HCT 30.8 (L) 11/15/2021 0140   HCT  32.2 (L) 07/07/2020 0808   PLT 251 11/15/2021 0140   PLT 350 07/07/2020 0808   MCV 89.0 11/15/2021 0140   MCV 87 07/07/2020 0808   MCV 91 04/05/2012 0827   MCH 29.5 11/15/2021 0140   MCHC 33.1 11/15/2021 0140   RDW 12.6 11/15/2021 0140   RDW 13.4 07/07/2020 0808   RDW 13.2 04/05/2012 0827   LYMPHSABS 1.8 10/22/2020 1300   LYMPHSABS 1.8 07/07/2020 0808   MONOABS 0.7 10/22/2020 1300    EOSABS 0.1 10/22/2020 1300   EOSABS 0.2 07/07/2020 0808   BASOSABS 0.1 10/22/2020 1300   BASOSABS 0.0 07/07/2020 0808      Latest Ref Rng & Units 11/15/2021    1:40 AM 11/14/2021    1:53 AM 11/13/2021    5:38 AM  CMP  Glucose 70 - 99 mg/dL 142  118  128   BUN 6 - 20 mg/dL 53  64  71   Creatinine 0.44 - 1.00 mg/dL 2.31  2.44  2.40   Sodium 135 - 145 mmol/L 139  135  136   Potassium 3.5 - 5.1 mmol/L 3.5  2.4  2.5   Chloride 98 - 111 mmol/L 105  92  87   CO2 22 - 32 mmol/L '26  30  29   '$ Calcium 8.9 - 10.3 mg/dL 8.7  8.9  9.4   Total Protein 6.5 - 8.1 g/dL 7.2     Total Bilirubin 0.3 - 1.2 mg/dL 0.5     Alkaline Phos 38 - 126 U/L 95     AST 15 - 41 U/L 37     ALT 0 - 44 U/L 43        Radiology Studies: No results found.   Scheduled Meds:  amLODipine  10 mg Oral Daily   atorvastatin  80 mg Oral Daily   metoprolol succinate  100 mg Oral QPM   pantoprazole  40 mg Oral Daily   potassium chloride  60 mEq Oral BID   sodium chloride flush  3 mL Intravenous Q12H   warfarin  3 mg Oral ONCE-1600   Warfarin - Pharmacist Dosing Inpatient   Does not apply q1600   Continuous Infusions:     LOS: 3 days   Time spent: [place], MD [facility] To contact the attending provider between 7A-7P or the covering provider during after hours 7P-7A, please log into the web site [email or web address] and access using universal [place] password for that web site. If you do not have the password, please call the hospital operator.  11/15/2021, 10:39 AM

## 2021-11-16 ENCOUNTER — Other Ambulatory Visit: Payer: Self-pay | Admitting: Physician Assistant

## 2021-11-16 ENCOUNTER — Telehealth: Payer: Self-pay | Admitting: Cardiology

## 2021-11-16 ENCOUNTER — Encounter: Payer: Self-pay | Admitting: Internal Medicine

## 2021-11-16 DIAGNOSIS — N1832 Chronic kidney disease, stage 3b: Secondary | ICD-10-CM | POA: Diagnosis not present

## 2021-11-16 DIAGNOSIS — N179 Acute kidney failure, unspecified: Secondary | ICD-10-CM | POA: Diagnosis not present

## 2021-11-16 DIAGNOSIS — F419 Anxiety disorder, unspecified: Secondary | ICD-10-CM

## 2021-11-16 LAB — CBC WITH DIFFERENTIAL/PLATELET
Abs Immature Granulocytes: 0.02 10*3/uL (ref 0.00–0.07)
Basophils Absolute: 0.1 10*3/uL (ref 0.0–0.1)
Basophils Relative: 1 %
Eosinophils Absolute: 0.3 10*3/uL (ref 0.0–0.5)
Eosinophils Relative: 4 %
HCT: 29.3 % — ABNORMAL LOW (ref 36.0–46.0)
Hemoglobin: 9.4 g/dL — ABNORMAL LOW (ref 12.0–15.0)
Immature Granulocytes: 0 %
Lymphocytes Relative: 29 %
Lymphs Abs: 1.8 10*3/uL (ref 0.7–4.0)
MCH: 29.8 pg (ref 26.0–34.0)
MCHC: 32.1 g/dL (ref 30.0–36.0)
MCV: 93 fL (ref 80.0–100.0)
Monocytes Absolute: 0.6 10*3/uL (ref 0.1–1.0)
Monocytes Relative: 9 %
Neutro Abs: 3.5 10*3/uL (ref 1.7–7.7)
Neutrophils Relative %: 57 %
Platelets: 197 10*3/uL (ref 150–400)
RBC: 3.15 MIL/uL — ABNORMAL LOW (ref 3.87–5.11)
RDW: 13 % (ref 11.5–15.5)
WBC: 6.2 10*3/uL (ref 4.0–10.5)
nRBC: 0 % (ref 0.0–0.2)

## 2021-11-16 LAB — COMPREHENSIVE METABOLIC PANEL
ALT: 41 U/L (ref 0–44)
AST: 37 U/L (ref 15–41)
Albumin: 3.2 g/dL — ABNORMAL LOW (ref 3.5–5.0)
Alkaline Phosphatase: 87 U/L (ref 38–126)
Anion gap: 25 — ABNORMAL HIGH (ref 5–15)
BUN: 45 mg/dL — ABNORMAL HIGH (ref 6–20)
CO2: 19 mmol/L — ABNORMAL LOW (ref 22–32)
Calcium: 7.5 mg/dL — ABNORMAL LOW (ref 8.9–10.3)
Chloride: 102 mmol/L (ref 98–111)
Creatinine, Ser: 2.15 mg/dL — ABNORMAL HIGH (ref 0.44–1.00)
GFR, Estimated: 27 mL/min — ABNORMAL LOW (ref >60)
Glucose, Bld: 151 mg/dL — ABNORMAL HIGH (ref 70–99)
Potassium: 3.6 mmol/L (ref 3.5–5.1)
Sodium: 146 mmol/L — ABNORMAL HIGH (ref 135–145)
Total Bilirubin: 0.4 mg/dL (ref 0.3–1.2)
Total Protein: 6.7 g/dL (ref 6.5–8.1)

## 2021-11-16 LAB — PROTIME-INR
INR: 3.2 — ABNORMAL HIGH (ref 0.8–1.2)
Prothrombin Time: 32.8 seconds — ABNORMAL HIGH (ref 11.4–15.2)

## 2021-11-16 MED ORDER — TORSEMIDE 20 MG PO TABS: 20.0000 mg | ORAL_TABLET | Freq: Two times a day (BID) | ORAL | 2 refills | Status: DC

## 2021-11-16 MED ORDER — POTASSIUM CHLORIDE CRYS ER 20 MEQ PO TBCR: 60.0000 meq | EXTENDED_RELEASE_TABLET | Freq: Two times a day (BID) | ORAL | 0 refills | Status: DC

## 2021-11-16 NOTE — Telephone Encounter (Signed)
LMOV to schedule follow up

## 2021-11-16 NOTE — Discharge Summary (Signed)
Physician Discharge Summary  Leslie [name] LMB:[id number] DOB: [birth year] DOA: 11/11/2021  PCP: [name], PA-C  Admit date: 11/11/2021 Discharge date: 11/16/2021  Time spent: 27 minutes  Recommendations for Outpatient Follow-up:  Medication changes: Torsemide 20 twice daily-with instruction to take an extra 20 mg if gains more than 1 or 1-1/2 kg in 24 hours, potassium 60 twice daily Needs labs in about 5 to 7 days and adjustment of meds by cardiology-I have reached out to 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 Permanent A-fib plus left atrial appendage clip status post maze CKD 3 Persistent severe hypokalemia likely secondary to diuretics this admission HTN HLD BMI 41  Please see below for itemized issues addressed in [place]- refer to other progress notes for clarity if needed  Discharge Condition: Improved  Diet recommendation: Heart healthy  Filed Weights   11/12/21 1449 11/15/21 0419 11/16/21 0212  Weight: 110 kg 113.1 kg 112.4 kg    History of present illness:  56 year old black female HFpEF 60-65% 08/04/2021 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 on chronic Coumadin (2.5-3.5) Permanent A-fib status post Maze procedure + left atrial appendage clip CKD 3 AA Reflux HTN ILD Seen at cardiology office [place] 10/16/2021 for shortness of breath-->started metolazone 5 torsemide 40 and on review on 10/12 still found to be volume up with chest pain-work-up in emergency room f and sent home with diagnosis of reflux sent on Carafate and told to take potassium and azithromycin Return 10/25 with oliguria mild abdominal pain small amounts of urination nausea-potassium found to be 2.3 creatinine 3.3 Admitted for AKI  hypokalemia    Hospital Course:  ATN from meds causing AKI superimposed on CKD 3--baseline creatinine 1.6 PTA Aldactone 12.5 torsemide 20 all were held  Demadex adjusted as below Her oliguria has resolved and she is passing better urine for the past 48 hours Creatinine may be a new baseline for her and would probably recommend keeping her more dry than wet to keep her out of heart failure-monitor trends as an outpatient   Cardiorenal syndrome?   HFpEF EF 60-65% 08/04/2021 Prior mitral valve repair 05/07/2020 Volume up 2.3 L--last office weight 112 kg today is 113 We will resume cautiously torsemide 20 twice daily--- will be discharging with this dose and will need to take an extra 20 mg dosing on discharge if gains more than 1 to 1-1/2 kg in 24 hours  Depending on labs further work-up etc. may need to start talking with patient about renal replacement therapies as an outpatient   Persistent severe hypokalemia Replaced with IV fluid +80 of K as well as 60 twice daily orally--continuing oral replacement at discharge of 60 twice daily Magnesium reasonable at 1.8 Will need outpatient labs in about 1 week   Abdominal pain Known history of kidney stones CT scan without contrast 7 mm right kidney stone Monitor trends   Permanent A-fib on Coumadin CHA2DS2-VASc >4 Continue metoprolol XL 100 daily, Coumadin subtherapeutic 2.2-goal 2.5-3.5  Therapeutic at discharge needs labs in about a week   HTN I HLD  Continue amlodipine 10, atorvastatin, aspirin   BMI 41   Discharge Exam: Vitals:   11/16/21 0634 11/16/21 0725  BP: 134/63 (!) 131/59  Pulse: 67 67  Resp: 20   Temp: 98 F (36.7 C) 98  F (36.7 C)  SpO2: 100% 100%    Subj on day of d/c   Awake coherent no distress seems comfortable breathing well passing good urine excited to go home  General Exam on discharge  EOMI NCAT thick neck Mallampati 4, no JVD significant Chest is clear no rales rhonchi wheeze S1-S2 artifact on  monitors with PVC Abdomen obese nontender no rebound no guarding Trace lower extremity edema  Discharge Instructions   Discharge Instructions     Diet - low sodium heart healthy   Complete by: As directed    Discharge instructions   Complete by: As directed    ensure that you take your potassium pills and the lower dose of torsemide that you have been prescribed for your heart failure you will notice that we have discontinued several other medications which were felt to be contributing to your low potassium and other problems If you gain more than 1 kg or 2.5 pounds in 24 hours I would recommend that you take an extra small dose of the torsemide 20 mg and keep a close check on your weight and on your fluid intake We in addition are trying to correct your potassium rather aggressively I have only given you 10-day supply of potassium but you do need to take it and ensure that you get labs in about a week and your cardiology office can guide you with regards to the same. You will need a transition of care visit with the heart failure team at [facility] and I will try to reach out to them but please follow-up and give them a call if you do not hear from them in 5 to 7 days, tell them that you were hospitalized and make sure that you are seen within 7 to 10 days If you have severe chest pain fever chills nausea vomiting please follow-up in the emergency   Increase activity slowly   Complete by: As directed       Allergies as of 11/16/2021       Reactions   Morphine Hives, Rash   Oxycodone Hcl Hives, Itching   Dilaudid [hydromorphone Hcl] Itching        Medication List     STOP taking these medications    spironolactone 25 MG tablet Commonly known as: ALDACTONE       TAKE these medications    albuterol 108 (90 Base) MCG/ACT inhaler Commonly known as: VENTOLIN HFA Inhale 2 puffs into the lungs every 6 (six) hours as needed for wheezing or shortness of breath.   ALPRAZolam 0.5  MG tablet Commonly known as: XANAX TAKE 1/2 TABLET BY MOUTH AT BEDTIME AS NEEDED FOR ANXIETY What changed: See the new instructions.   amLODipine 10 MG tablet Commonly known as: NORVASC TAKE 1 TABLET(10 MG) BY MOUTH DAILY. What changed: See the new instructions.   aspirin EC 81 MG tablet Take 81 mg by mouth daily.   atorvastatin 80 MG tablet Commonly known as: LIPITOR TAKE 1 TABLET BY MOUTH EVERY DAY AT 6 PM What changed: See the new instructions.   calcium carbonate 500 MG chewable tablet Commonly known as: TUMS - dosed in mg elemental calcium Chew 500 mg by mouth daily as needed for indigestion or heartburn.   Ferocon capsule Generic drug: ferrous [id number] C-folic acid TAKE ONE CAPSULE BY MOUTH TWICE DAILY AFTER MEALS What changed: See the new instructions.   metoprolol succinate 100 MG 24 hr tablet Commonly known as: TOPROL-XL TAKE 1 TABLET(100 MG) BY MOUTH TWICE  DAILY WITH OR IMMEDIATELY FOLLOWING A MEAL What changed: See the new instructions.   omeprazole 40 MG capsule Commonly known as: PRILOSEC TAKE ONE CAPSULE BY MOUTH DAILY   ondansetron 4 MG disintegrating tablet Commonly known as: ZOFRAN-ODT Take 1 tablet (4 mg total) by mouth every 8 (eight) hours as needed for nausea or vomiting.   potassium chloride SA 20 MEQ tablet Commonly known as: KLOR-CON M Take 3 tablets (60 mEq total) by mouth 2 (two) times daily. What changed:  how much to take how to take this when to take this additional instructions   sucralfate 1 g tablet Commonly known as: Carafate Take 1 tablet (1 g total) by mouth 4 (four) times daily -  with meals and at bedtime for 7 days.   torsemide 20 MG tablet Commonly known as: DEMADEX Take 1 tablet (20 mg total) by mouth 2 (two) times daily. What changed:  how much to take when to take this   warfarin 2 MG tablet Commonly known as: COUMADIN Take as directed. If you are unsure how to take this medication, talk to your nurse  or doctor. Original instructions: TAKE 1/2 TO 1 TABLET BY MOUTH DAILY AS DIRECTED BY THE ANTI-COAG CLINIC What changed: See the new instructions.       Allergies  Allergen Reactions   Morphine Hives and Rash   Oxycodone Hcl Hives and Itching   Dilaudid [Hydromorphone Hcl] Itching    Follow-up Information     Kate Sable, MD Follow up in 1 week(s).   Specialties: Cardiology, Radiology Why: for [facility] visit Contact information: [place] [address] [phone]                  The results of significant diagnostics from this hospitalization (including imaging, microbiology, ancillary and laboratory) are listed below for reference.    Significant Diagnostic Studies: CT ABDOMEN PELVIS WO CONTRAST  Result Date: 11/11/2021 CLINICAL DATA:  Generalized abdominal pain with nausea. EXAM: CT ABDOMEN AND PELVIS WITHOUT CONTRAST TECHNIQUE: Multidetector CT imaging of the abdomen and pelvis was performed following the standard protocol without IV contrast. RADIATION DOSE REDUCTION: This exam was performed according to the departmental dose-optimization program which includes automated exposure control, adjustment of the mA and/or kV according to patient size and/or use of iterative reconstruction technique. COMPARISON:  06/13/2021 FINDINGS: Lower chest: No acute findings Hepatobiliary: No suspicious focal abnormality in the liver on this study without intravenous contrast. Gallbladder is surgically absent. No intrahepatic or extrahepatic biliary dilation. Pancreas: No focal mass lesion. No dilatation of the main duct. No intraparenchymal cyst. No peripancreatic edema. Spleen: No splenomegaly. No focal mass lesion. Adrenals/Urinary Tract: No adrenal nodule or mass. Stable small cyst upper pole right kidney. No followup imaging is recommended. 7 mm nonobstructing stone again noted interpolar right kidney. Left kidney unremarkable. No evidence for hydroureter. The  urinary bladder appears normal for the degree of distention. Stomach/Bowel: Tiny hiatal hernia. Stomach unremarkable. Duodenum is normally positioned as is the ligament of Treitz. No small bowel wall thickening. No small bowel dilatation. The terminal ileum is normal. The appendix is normal. No gross colonic mass. No colonic wall thickening. Vascular/Lymphatic: There is moderate atherosclerotic calcification of the abdominal aorta without aneurysm. There is no gastrohepatic or hepatoduodenal ligament lymphadenopathy. No retroperitoneal or mesenteric lymphadenopathy. No pelvic sidewall lymphadenopathy. Reproductive: Uterus surgically absent.  There is no adnexal mass. Other: No intraperitoneal free fluid. Musculoskeletal: Stable appearance of soft tissue attenuation in the subcutaneous fat of  the posterolateral left hip region, potentially chronic hematoma/contusion. No worrisome lytic or sclerotic osseous abnormality. IMPRESSION: 1. No acute findings in the abdomen or pelvis. Specifically, no findings to explain the patient's history of abdominal pain with nausea and vomiting. 2. 7 mm nonobstructing right renal stone. 3. Tiny hiatal hernia. 4. Aortic Atherosclerosis (ICD10-I70.0). Electronically Signed   By: Misty Stanley M.D.   On: 11/11/2021 18:30   DG Chest 2 View  Result Date: 10/29/2021 CLINICAL DATA:  Chest pain EXAM: CHEST - 2 VIEW COMPARISON:  06/19/2020 FINDINGS: Atrial appendage clip and valve prosthesis. No consolidation or effusion. No pneumothorax. Normal cardiac size with aortic atherosclerosis. Diffuse increased interstitial opacity. IMPRESSION: 1. Mild diffuse increased interstitial opacity suggesting interstitial inflammatory process. No focal airspace opacity 2. Normal cardiac size with no evidence for pleural effusion or overt edema Electronically Signed   By: Donavan Foil M.D.   On: 10/29/2021 16:00    Microbiology: No results found for this or any previous visit (from the past 240  hour(s)).   Labs: Basic Metabolic Panel: Recent Labs  Lab 11/11/21 1050 11/12/21 0215 11/13/21 0538 11/14/21 0153 11/15/21 0140 11/16/21 0209  NA  --  129* 136 135 139 146*  K  --  2.6* 2.5* 2.4* 3.5 3.6  CL  --  88* 87* 92* 105 102  CO2  --  '28 29 30 26 '$ 19*  GLUCOSE  --  111* 128* 118* 142* 151*  BUN  --  77* 71* 64* 53* 45*  CREATININE  --  2.67* 2.40* 2.44* 2.31* 2.15*  CALCIUM  --  8.2* 9.4 8.9 8.7* 7.5*  MG 2.1  --   --  2.0 1.8  --    Liver Function Tests: Recent Labs  Lab 11/11/21 1007 11/12/21 0215 11/15/21 0140 11/16/21 0209  AST 43* 42* 37 37  ALT 38 33 43 41  ALKPHOS 127* 96 95 87  BILITOT 0.6 0.7 0.5 0.4  PROT 9.1* 6.9 7.2 6.7  ALBUMIN 4.6 3.5 3.5 3.2*   Recent Labs  Lab 11/11/21 1007  LIPASE 28   No results for input(s): "AMMONIA" in the last 168 hours. CBC: Recent Labs  Lab 11/11/21 1007 11/12/21 0215 11/14/21 0153 11/15/21 0140 11/16/21 0209  WBC 6.4 7.5 7.6 8.3 6.2  NEUTROABS  --   --   --   --  3.5  HGB 13.8 11.4* 12.4 10.2* 9.4*  HCT 40.6 33.6* 36.9 30.8* 29.3*  MCV 86.4 86.4 87.2 89.0 93.0  PLT 337 286 286 251 197   Cardiac Enzymes: No results for input(s): "CKTOTAL", "CKMB", "CKMBINDEX", "TROPONINI" in the last 168 hours. BNP: BNP (last 3 results) Recent Labs    10/29/21 1533  BNP 211.4*    ProBNP (last 3 results) No results for input(s): "PROBNP" in the last 8760 hours.  CBG: No results for input(s): "GLUCAP" in the last 168 hours.     Signed:  Nita Sells MD   [facility] 11/16/2021, 8:28 AM

## 2021-11-16 NOTE — Plan of Care (Signed)

## 2021-11-16 NOTE — Telephone Encounter (Signed)
-----   Message from Kate Sable, MD sent at 11/16/2021  8:44 AM EDT ----- Please schedule hospital follow-up in 7 to 10 days with myself or APP.  Currently admitted at [facility].  Discharge being planned for today.  Thank you  BA

## 2021-11-16 NOTE — Progress Notes (Addendum)
[place] for warfarin Indication: mechanical mitral valve and atrial fibrillation s/p maze procedure  Allergies  Allergen Reactions   Morphine Hives and Rash   Oxycodone Hcl Hives and Itching   Dilaudid [Hydromorphone Hcl] Itching    Patient Measurements: Height: _0  (162.6 cm) Weight: 112.4 kg (247 lb 12.8 oz) IBW/kg (Calculated) : 54.7  Vital Signs: Temp: 98 F (36.7 C) (10/30 0725) Temp Source: Oral (10/30 0725) BP: 131/59 (10/30 0725) Pulse Rate: 67 (10/30 0725)  Labs: Recent Labs    11/14/21 0153 11/15/21 0140 11/16/21 0209  HGB 12.4 10.2* 9.4*  HCT 36.9 30.8* 29.3*  PLT 286 251 197  LABPROT 22.0* 24.3* 32.8*  INR 1.9* 2.2* 3.2*  CREATININE 2.44* 2.31* 2.15*     Estimated Creatinine Clearance: 36.3 mL/min (A) (by C-G formula based on SCr of 2.15 mg/dL (H)).   Medical History: Past Medical History:  Diagnosis Date   (HFpEF) heart failure with preserved ejection fraction ([place])    a. 08/2017 Echo: EF 55-60%.  Grade 2 diastolic dysfunction; b. 07/3530 Echo: EF 50-55%, no rwma, Nl RV fxn, nl fxn'ing mech MV.   Allergy    Anemia    Anxiety    BRCA negative 03/22/2013   Bronchitis 02/19/2016   ON LEVAQUIN PO   Chest tightness    a. 08/2019 Cath: nl cors.   Cigarette smoker 09/11/2017   8-10 day   Dysrhythmia    GERD (gastroesophageal reflux disease)    History of kidney stones    Hyperlipidemia    Hypertension    Interstitial lung disease ([place])    a. CT 2013 b. 02/2018 CXR noted recurrent intersistial changes ILD vs chronic bronchitis   Moderate mitral stenosis    a.  08/2017 TEE: EF 60 to 65%.  Moderate mitral stenosis.  Mean gradient 14 mmHg.  Valve area 2.59 cm by planimetry, 2.72 cm by pressure half-time.   PAF (paroxysmal atrial fibrillation) ([place])    a. 08/2017 s/p TEE/DCCV; b. CHA2DS2VASc = 2-->warfarin.   Pneumonia    S/P Maze operation for atrial fibrillation 04/29/2020   Complete bilateral atrial lesion  set using bipolar radiofrequency and cryothermy ablation with clipping of LA appendage   S/P mitral valve replacement with Onyx bileaflet mechanical valve 04/29/2020   a. 04/2020 s/p 27/29 mm Onyx mech mitral valve-->chronic coumadin; b. 05/2020 Echo: Nl fxn'ing mech MV.    Assessment: 56 y/o female presented to the ED for abdominal pain and nausea. Pt is on warfarin PTA for mechanical mitral valve and afib. Pharmacy consulted to resume warfarin. Patient reports regimen is 2 mg every day except 1 mg on SuTh; which is different from outpatient records. This regimen was confirmed with the patient.   INR is therapeutic at 3.2 after multiple higher doses. No s/sx of bleeding noted. Hgb down just slightly to 9.4, plt WNL at 197.   Goal of Therapy:  INR 2.5-3.5 Monitor platelets by anticoagulation protocol: Yes   Plan:  Hold warfarin tonight given INR jump  Would restart PTA regimen at discharge  Monitor daily INR, signs and symptoms of bleeding  Antonietta Jewel, PharmD, BCCCP Clinical Pharmacist  Phone: [phone] 11/16/2021 9:59 AM  Please check AMION for all [place] phone numbers After 10:00 PM, call [place] [phone]

## 2021-11-17 ENCOUNTER — Encounter: Payer: Self-pay | Admitting: Internal Medicine

## 2021-11-18 ENCOUNTER — Other Ambulatory Visit: Payer: Self-pay | Admitting: Internal Medicine

## 2021-11-18 MED ORDER — POTASSIUM CHLORIDE CRYS ER 20 MEQ PO TBCR: 60.0000 meq | EXTENDED_RELEASE_TABLET | Freq: Two times a day (BID) | ORAL | 0 refills | Status: DC

## 2021-11-18 NOTE — Progress Notes (Signed)
HOSPITAL MEDICINE TELEPHONE ENCOUNTER NOTE  Contacted by patient stating that the pharmacy did not receive the intended prescription for 60 mill equivalents of oral potassium chloride twice daily for patient's chronic substantial hypokalemia.  Notes reviewed, original discharge performed by Dr. Verlon Au with my group.  Sending a new prescription for 60 mill equivalents of oral potassium chloride twice daily for a 1 month supply.  Patient has been strictly instructed to follow-up closely with her primary care provider for a repeat chemistry to ensure potassium levels are within normal limits in the next several days.  Vernelle Emerald MD [facility]

## 2021-11-27 ENCOUNTER — Other Ambulatory Visit: Payer: Self-pay | Admitting: Physician Assistant

## 2021-11-27 DIAGNOSIS — I5032 Chronic diastolic (congestive) heart failure: Secondary | ICD-10-CM

## 2021-11-30 ENCOUNTER — Ambulatory Visit: Payer: BLUE CROSS/BLUE SHIELD | Attending: Cardiology | Admitting: Cardiology

## 2021-11-30 ENCOUNTER — Other Ambulatory Visit: Payer: Self-pay

## 2021-11-30 ENCOUNTER — Other Ambulatory Visit
Admission: RE | Admit: 2021-11-30 | Discharge: 2021-11-30 | Disposition: A | Discharge status: Home / Self Care | Payer: BLUE CROSS/BLUE SHIELD | Source: Ambulatory Visit | Attending: Cardiology | Admitting: Cardiology

## 2021-11-30 ENCOUNTER — Encounter: Payer: Self-pay | Admitting: Cardiology

## 2021-11-30 VITALS — BP 130/68 | HR 62 | Ht 64.0 in | Wt 244.0 lb

## 2021-11-30 DIAGNOSIS — I509 Heart failure, unspecified: Secondary | ICD-10-CM | POA: Insufficient documentation

## 2021-11-30 DIAGNOSIS — Z952 Presence of prosthetic heart valve: Secondary | ICD-10-CM | POA: Diagnosis not present

## 2021-11-30 DIAGNOSIS — I4891 Unspecified atrial fibrillation: Secondary | ICD-10-CM | POA: Diagnosis not present

## 2021-11-30 DIAGNOSIS — E785 Hyperlipidemia, unspecified: Secondary | ICD-10-CM

## 2021-11-30 LAB — BASIC METABOLIC PANEL
Anion gap: 12 (ref 5–15)
BUN: 55 mg/dL — ABNORMAL HIGH (ref 6–20)
CO2: 32 mmol/L (ref 22–32)
Calcium: 9.5 mg/dL (ref 8.9–10.3)
Chloride: 92 mmol/L — ABNORMAL LOW (ref 98–111)
Creatinine, Ser: 2.84 mg/dL — ABNORMAL HIGH (ref 0.44–1.00)
GFR, Estimated: 19 mL/min — ABNORMAL LOW (ref >60)
Glucose, Bld: 108 mg/dL — ABNORMAL HIGH (ref 70–99)
Potassium: 3.3 mmol/L — ABNORMAL LOW (ref 3.5–5.1)
Sodium: 136 mmol/L (ref 135–145)

## 2021-11-30 MED ORDER — ATORVASTATIN CALCIUM 80 MG PO TABS: 80.0000 mg | ORAL_TABLET | Freq: Every day | ORAL | 0 refills | Status: DC

## 2021-11-30 NOTE — Patient Instructions (Signed)
Call [facility] to make your appointment at [phone]  Medication Instructions:   Your physician recommends that you continue on your current medications as directed. Please refer to the Current Medication list given to you today.  *If you need a refill on your cardiac medications before your next appointment, please call your pharmacy*   Lab Work:  Please go to the [facility] after your appointment today for a BMP lab draw.     Follow-Up: At [facility], you and your health needs are our priority.  As part of our continuing mission to provide you with exceptional heart care, we have created designated Provider Care Teams.  These Care Teams include your primary Cardiologist (physician) and Advanced Practice Providers (APPs -  Physician Assistants and Nurse Practitioners) who all work together to provide you with the care you need, when you need it.  We recommend signing up for the patient portal called "MyChart".  Sign up information is provided on this After Visit Summary.  MyChart is used to connect with patients for Virtual Visits (Telemedicine).  Patients are able to view lab/test results, encounter notes, upcoming appointments, etc.  Non-urgent messages can be sent to your provider as well.   To learn more about what you can do with MyChart, go to [email or web address].    Your next appointment:   6 week(s)  The format for your next appointment:   In Person  Provider:   You may see Ida Rogue, MD or one of the following Advanced Practice Providers on your designated Care Team:   Murray Hodgkins, NP Christell Faith, PA-C Cadence Kathlen Mody, PA-C Gerrie Nordmann, NP    Other Instructions   Important Information About Sugar

## 2021-11-30 NOTE — Progress Notes (Signed)
Cardiology Office Note:    Date:  11/30/2021   ID:  Leslie [name], DOB 22-Sep-1965, MRN [id number]  PCP:  Carolynne Edouard   [place] Providers Cardiologist:  Ida Rogue, MD     Referring MD: [name], PA*   Chief Complaint  Patient presents with   Follow-up    7-10 day follow up, still not feeling well    History of Present Illness:    Leslie [name] is a 56 y.o. female with a hx of HFpEF, atrial fibrillation, mitral stenosis s/p mechanical mitral valve replacement plus MAZE on Coumadin, morbid obesity, former smoker who presents for follow-up.  Previously seen for shortness of breath, patient transferred to the hospital due to volume overload.  Previously on torsemide 40 mg twice daily, metolazone added.  Noted to have hypokalemia with worsening renal function.  Potassium was repleted, torsemide reduced to 20 mg daily.  Aldactone stopped.  Kidney function improved/stabilized, potassium levels normalized.  Currently takes 60 mEq of potassium twice daily.  States feeling better, although she still has occasional cramps.  Trying to exercise more to help with weight loss.   Prior notes/studies Echo 07/2021 EF 60 to 65%  Past Medical History:  Diagnosis Date   (HFpEF) heart failure with preserved ejection fraction ([place])    a. 08/2017 Echo: EF 55-60%.  Grade 2 diastolic dysfunction; b. 05/8848 Echo: EF 50-55%, no rwma, Nl RV fxn, nl fxn'ing mech MV.   Allergy    Anemia    Anxiety    BRCA negative 03/22/2013   Bronchitis 02/19/2016   ON LEVAQUIN PO   Chest tightness    a. 08/2019 Cath: nl cors.   Cigarette smoker 09/11/2017   8-10 day   Dysrhythmia    GERD (gastroesophageal reflux disease)    History of kidney stones    Hyperlipidemia    Hypertension    Interstitial lung disease ([place])    a. CT 2013 b. 02/2018 CXR noted recurrent intersistial changes ILD vs chronic bronchitis   Moderate mitral stenosis    a.  08/2017 TEE: EF 60 to 65%.   Moderate mitral stenosis.  Mean gradient 14 mmHg.  Valve area 2.59 cm by planimetry, 2.72 cm by pressure half-time.   PAF (paroxysmal atrial fibrillation) ([place])    a. 08/2017 s/p TEE/DCCV; b. CHA2DS2VASc = 2-->warfarin.   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 04/2020 s/p 27/29 mm Onyx mech mitral valve-->chronic coumadin; b. 05/2020 Echo: Nl fxn'ing mech MV.    Past Surgical History:  Procedure Laterality Date   ABDOMINAL HYSTERECTOMY     total   ABDOMINAL HYSTERECTOMY     BREAST BIOPSY Bilateral 2012   BREAST BIOPSY Right 08-07-12   fibroadenomatous changes and columnar cells   BREAST BIOPSY  02/03/2015   stereo byrnett   BUBBLE STUDY  04/01/2020   Procedure: BUBBLE STUDY;  Surgeon: [name], MD;  Location: [place];  Service: Cardiovascular;;   CARDIAC CATHETERIZATION     CHOLECYSTECTOMY N/A 02/27/2016   Procedure: LAPAROSCOPIC CHOLECYSTECTOMY;  Surgeon: Christene Lye, MD;  Location: [facility];  Service: General;  Laterality: N/A;   CLIPPING OF ATRIAL APPENDAGE  04/29/2020   Procedure: CLIPPING OF ATRIAL APPENDAGE USING ATRICURE  PRO2 CLIP SIZE 45MM;  Surgeon:  Rexene Alberts, MD;  Location: [facility];  Service: Open Heart Surgery;;   COLONOSCOPY WITH PROPOFOL N/A 09/26/2018   Procedure: COLONOSCOPY WITH PROPOFOL;  Surgeon: Lucilla Lame, MD;  Location: [facility] ENDOSCOPY;  Service: Endoscopy;  Laterality: N/A;   COLONOSCOPY WITH PROPOFOL N/A 07/16/2021   Procedure: COLONOSCOPY WITH PROPOFOL;  Surgeon: Lucilla Lame, MD;  Location: [facility] ENDOSCOPY;  Service: Endoscopy;  Laterality: N/A;   CYSTOSCOPY W/ RETROGRADES Right 08/18/2018   Procedure: CYSTOSCOPY WITH RETROGRADE PYELOGRAM;  Surgeon: Billey Co, MD;  Location: [facility];  Service: Urology;   Laterality: Right;   CYSTOSCOPY/URETEROSCOPY/HOLMIUM LASER/STENT PLACEMENT Right 08/18/2018   Procedure: CYSTOSCOPY/URETEROSCOPY/STENT PLACEMENT;  Surgeon: Billey Co, MD;  Location: [facility];  Service: Urology;  Laterality: Right;   DIAGNOSTIC LAPAROSCOPY     ESOPHAGOGASTRODUODENOSCOPY N/A 07/16/2021   Procedure: ESOPHAGOGASTRODUODENOSCOPY (EGD);  Surgeon: Lucilla Lame, MD;  Location: [facility] ENDOSCOPY;  Service: Endoscopy;  Laterality: N/A;   ESOPHAGOGASTRODUODENOSCOPY (EGD) WITH PROPOFOL N/A 09/26/2018   Procedure: ESOPHAGOGASTRODUODENOSCOPY (EGD) WITH PROPOFOL;  Surgeon: Lucilla Lame, MD;  Location: [facility] ENDOSCOPY;  Service: Endoscopy;  Laterality: N/A;   GIVENS CAPSULE STUDY N/A 11/03/2018   Procedure: GIVENS CAPSULE STUDY;  Surgeon: Lucilla Lame, MD;  Location: [facility] ENDOSCOPY;  Service: Endoscopy;  Laterality: N/A;   JOINT REPLACEMENT Left    knee   KNEE ARTHROPLASTY Right 04/09/2019   Procedure: COMPUTER ASSISTED TOTAL KNEE ARTHROPLASTY;  Surgeon: Dereck Leep, MD;  Location: [facility];  Service: Orthopedics;  Laterality: Right;   KNEE CLOSED REDUCTION Left 04/15/2015   Procedure: CLOSED MANIPULATION KNEE;  Surgeon: Thornton Park, MD;  Location: [facility];  Service: Orthopedics;  Laterality: Left;   KNEE SURGERY     MAZE N/A 04/29/2020   Procedure: MAZE;  Surgeon: Rexene Alberts, MD;  Location: [place];  Service: Open Heart Surgery;  Laterality: N/A;   MITRAL VALVE REPLACEMENT N/A 04/29/2020   Procedure: MITRAL VALVE (MV) REPLACEMENT USING ON-X VALVE SIZE 27/29MM;  Surgeon: Rexene Alberts, MD;  Location: [place];  Service: Open Heart Surgery;  Laterality: N/A;   MULTIPLE EXTRACTIONS WITH ALVEOLOPLASTY N/A 02/28/2020   Procedure: MULTIPLE EXTRACTION WITH ALVEOLOPLASTY;  Surgeon: [name], DMD;  Location: [place];  Service: Dentistry;  Laterality: N/A;   RIGHT/LEFT HEART CATH AND CORONARY ANGIOGRAPHY Bilateral 09/06/2019   Procedure: RIGHT/LEFT HEART CATH AND CORONARY ANGIOGRAPHY;   Surgeon: Minna Merritts, MD;  Location: [place] [facility];  Service: Cardiovascular;  Laterality: Bilateral;   TEE WITHOUT CARDIOVERSION N/A 09/13/2017   Procedure: TRANSESOPHAGEAL ECHOCARDIOGRAM (TEE);  Surgeon: Nelva Bush, MD;  Location: [facility];  Service: Cardiovascular;  Laterality: N/A;   TEE WITHOUT CARDIOVERSION N/A 04/01/2020   Procedure: TRANSESOPHAGEAL ECHOCARDIOGRAM (TEE);  Surgeon: [name], MD;  Location: [facility] ENDOSCOPY;  Service: Cardiovascular;  Laterality: N/A;   TEE WITHOUT CARDIOVERSION N/A 04/29/2020   Procedure: TRANSESOPHAGEAL ECHOCARDIOGRAM (TEE);  Surgeon: Rexene Alberts, MD;  Location: [place];  Service: Open Heart Surgery;  Laterality: N/A;   TOTAL KNEE ARTHROPLASTY Left 12/25/2014   Procedure: TOTAL KNEE ARTHROPLASTY;  Surgeon: Thornton Park, MD;  Location: [facility];  Service: Orthopedics;  Laterality: Left;   TUBAL LIGATION      Current Medications: Current Meds  Medication Sig   albuterol (VENTOLIN HFA) 108 (90 Base) MCG/ACT inhaler INHALE 2 PUFFS INTO THE LUNGS EVERY 6 HOURS AS NEEDED FOR WHEEZING OR SHORTNESS OF BREATH   ALPRAZolam (XANAX) 0.5 MG tablet TAKE 1/2 TABLET BY MOUTH AT BEDTIME AS NEEDED FOR ANXIETY  amLODipine (NORVASC) 10 MG tablet TAKE 1 TABLET(10 MG) BY MOUTH DAILY. (Patient taking differently: Take 10 mg by mouth daily.)   aspirin 81 MG EC tablet Take 81 mg by mouth daily.   atorvastatin (LIPITOR) 80 MG tablet TAKE 1 TABLET BY MOUTH EVERY DAY AT 6 PM (Patient taking differently: Take 80 mg by mouth daily.)   calcium carbonate (TUMS - DOSED IN MG ELEMENTAL CALCIUM) 500 MG chewable tablet Chew 500 mg by mouth daily as needed for indigestion or heartburn.   FEROCON capsule TAKE ONE CAPSULE BY MOUTH TWICE DAILY AFTER MEALS (Patient taking differently: Take 1 capsule by mouth 2 (two) times daily after a meal.)   metoprolol succinate (TOPROL-XL) 100 MG 24 hr tablet TAKE 1 TABLET(100 MG) BY MOUTH TWICE DAILY WITH OR IMMEDIATELY  FOLLOWING A MEAL (Patient taking differently: Take 100 mg by mouth every evening.)   omeprazole (PRILOSEC) 40 MG capsule TAKE ONE CAPSULE BY MOUTH DAILY   ondansetron (ZOFRAN-ODT) 4 MG disintegrating tablet Take 1 tablet (4 mg total) by mouth every 8 (eight) hours as needed for nausea or vomiting.   potassium chloride SA (KLOR-CON M) 20 MEQ tablet Take 3 tablets (60 mEq total) by mouth 2 (two) times daily.   torsemide (DEMADEX) 20 MG tablet Take 1 tablet (20 mg total) by mouth 2 (two) times daily.   warfarin (COUMADIN) 2 MG tablet TAKE 1/2 TO 1 TABLET BY MOUTH DAILY AS DIRECTED BY THE ANTI-COAG CLINIC (Patient taking differently: Take 1-2 mg by mouth See admin instructions. Take 2 mg every day every day except on Sunday Thursday take 1 mg per patient)     Allergies:   Morphine, Oxycodone hcl, and Dilaudid [hydromorphone hcl]   Social History   Socioeconomic History   Marital status: Married    Spouse name: Not on file   Number of children: Not on file   Years of education: Not on file   Highest education level: Not on file  Occupational History   Not on file  Tobacco Use   Smoking status: Former    Packs/day: 0.50    Years: 20.00    Total pack years: 10.00    Types: Cigarettes    Quit date: 01/2016    Years since quitting: 5.8   Smokeless tobacco: Never  Vaping Use   Vaping Use: Never used  Substance and Sexual Activity   Alcohol use: Yes    Comment: rarely   Drug use: No   Sexual activity: Not on file  Other Topics Concern   Not on file  Social History Narrative   Not on file   Social Determinants of Health   Financial Resource Strain: Low Risk  (05/07/2020)   Overall Financial Resource Strain (CARDIA)    Difficulty of Paying Living Expenses: Not very hard  Food Insecurity: Unknown (11/16/2021)   Hunger Vital Sign    Worried About Running Out of Food in the Last Year: Not on file    Ran Out of Food in the Last Year: Never true  Transportation Needs: No Transportation  Needs (11/16/2021)   PRAPARE - Hydrologist (Medical): No    Lack of Transportation (Non-Medical): No  Physical Activity: Not on file  Stress: Not on file  Social Connections: Not on file     Family History: The patient's family history includes Cancer in her maternal aunt and maternal grandmother; Cancer (age of onset: 26) in her mother; Hypertension in her father and mother; Osteoarthritis in  her father.  ROS:   Please see the history of present illness.     All other systems reviewed and are negative.  EKGs/Labs/Other Studies Reviewed:    The following studies were reviewed today:   EKG:  EKG is  ordered today.  The ekg ordered today demonstrates sinus bradycardia, heart rate 56, otherwise normal ECG  Recent Labs: 06/26/2021: TSH 0.658 10/29/2021: B Natriuretic Peptide 211.4 11/15/2021: Magnesium 1.8 11/16/2021: ALT 41; BUN 45; Creatinine, Ser 2.15; Hemoglobin 9.4; Platelets 197; Potassium 3.6; Sodium 146  Recent Lipid Panel    Component Value Date/Time   CHOL 170 06/26/2021 0912   TRIG 160 (H) 06/26/2021 0912   HDL 36 (L) 06/26/2021 0912   CHOLHDL 5.2 09/19/2018 0932   VLDL 37 09/19/2018 0932   LDLCALC 106 (H) 06/26/2021 0912     Risk Assessment/Calculations:             Physical Exam:    VS:  BP 130/68 (BP Location: Left Arm, Patient Position: Sitting, Cuff Size: Large)   Pulse 62   Ht _0  (1.626 m)   Wt 244 lb (110.7 kg)   SpO2 98%   BMI 41.88 kg/m     Wt Readings from Last 3 Encounters:  11/30/21 244 lb (110.7 kg)  11/16/21 247 lb 12.8 oz (112.4 kg)  10/29/21 248 lb 14.4 oz (112.9 kg)     GEN:  Well nourished, well developed in no acute distress HEENT: Normal NECK: No JVD; No carotid bruits CARDIAC: RRR, no murmurs, rubs, gallops RESPIRATORY: Diminished breath sounds at bases ABDOMEN: Soft, non-tender, non-distended MUSCULOSKELETAL:  no edema; No deformity  SKIN: Warm and dry NEUROLOGIC:  Alert and oriented x  3 PSYCHIATRIC:  Normal affect   ASSESSMENT:    1. Congestive heart failure, unspecified HF chronicity, unspecified heart failure type ([place])   2. Atrial fibrillation, unspecified type ([place])   3. H/O mitral valve replacement with mechanical valve   4. Morbid obesity ([place])    PLAN:    In order of problems listed above:  HFpEF, appears euvolemic.  Continue with torsemide 20 mg twice daily, check BMP today.  Continue to hold Aldactone, plans to follow-up with nephrology due to CKD. Paroxysmal A-fib s/p Maze procedure, maintaining sinus rhythm.  Continue Toprol-XL, warfarin. History of mechanical mitral valve replacement, continue Coumadin. Morbid obesity, encouraged to continue exercising    Follow-up in 4 to 6 weeks with Dr. Rockey Situ.  Medication Adjustments/Labs and Tests Ordered: Current medicines are reviewed at length with the patient today.  Concerns regarding medicines are outlined above.  Orders Placed This Encounter  Procedures   Basic metabolic panel   EKG [id number]   No orders of the defined types were placed in this encounter.   Patient Instructions  Call [facility] to make your appointment at [phone]  Medication Instructions:   Your physician recommends that you continue on your current medications as directed. Please refer to the Current Medication list given to you today.  *If you need a refill on your cardiac medications before your next appointment, please call your pharmacy*   Lab Work:  Please go to the [place] after your appointment today for a BMP lab draw.     Follow-Up: At [facility], you and your health needs are our priority.  As part of our continuing mission to provide you with exceptional heart care, we have created designated Provider Care Teams.  These Care Teams include your primary Cardiologist (physician) and Advanced  Practice Providers (APPs -  Physician Assistants and Nurse Practitioners) who all  work together to provide you with the care you need, when you need it.  We recommend signing up for the patient portal called "MyChart".  Sign up information is provided on this After Visit Summary.  MyChart is used to connect with patients for Virtual Visits (Telemedicine).  Patients are able to view lab/test results, encounter notes, upcoming appointments, etc.  Non-urgent messages can be sent to your provider as well.   To learn more about what you can do with MyChart, go to [email or web address].    Your next appointment:   6 week(s)  The format for your next appointment:   In Person  Provider:   You may see Ida Rogue, MD or one of the following Advanced Practice Providers on your designated Care Team:   Murray Hodgkins, NP Christell Faith, PA-C Cadence Kathlen Mody, PA-C Gerrie Nordmann, NP    Other Instructions   Important Information About Sugar         Signed, Kate Sable, MD  11/30/2021 11:51 AM    [place]

## 2021-12-01 ENCOUNTER — Telehealth: Payer: Self-pay

## 2021-12-01 DIAGNOSIS — I5032 Chronic diastolic (congestive) heart failure: Secondary | ICD-10-CM

## 2021-12-01 DIAGNOSIS — E876 Hypokalemia: Secondary | ICD-10-CM

## 2021-12-01 MED ORDER — SUCRALFATE 1 G PO TABS: 1.0000 g | ORAL_TABLET | Freq: Three times a day (TID) | ORAL | 2 refills | Status: DC

## 2021-12-01 MED ORDER — POTASSIUM CHLORIDE CRYS ER 20 MEQ PO TBCR: EXTENDED_RELEASE_TABLET | ORAL | 0 refills | Status: DC

## 2021-12-01 NOTE — Telephone Encounter (Signed)
Called patient and discussed result note with her. Patient verbalized understanding and agreed with plan.

## 2021-12-01 NOTE — Telephone Encounter (Signed)
-----   Message from Kate Sable, MD sent at 12/01/2021 11:04 AM EST ----- Potassium is slightly low.  Advise patient to take 80 mEq KCl in the a.m., 60 mEq in p.m. repeat BMP in 1 week.

## 2021-12-03 ENCOUNTER — Encounter: Payer: Self-pay | Admitting: Physician Assistant

## 2021-12-03 ENCOUNTER — Ambulatory Visit (INDEPENDENT_AMBULATORY_CARE_PROVIDER_SITE_OTHER): Payer: BLUE CROSS/BLUE SHIELD | Admitting: Physician Assistant

## 2021-12-03 VITALS — BP 134/81 | HR 68 | Temp 98.3°F | Resp 16 | Ht 64.0 in | Wt 248.6 lb

## 2021-12-03 DIAGNOSIS — I5032 Chronic diastolic (congestive) heart failure: Secondary | ICD-10-CM | POA: Diagnosis not present

## 2021-12-03 DIAGNOSIS — E1165 Type 2 diabetes mellitus with hyperglycemia: Secondary | ICD-10-CM

## 2021-12-03 DIAGNOSIS — E876 Hypokalemia: Secondary | ICD-10-CM

## 2021-12-03 DIAGNOSIS — N183 Chronic kidney disease, stage 3 unspecified: Secondary | ICD-10-CM | POA: Diagnosis not present

## 2021-12-03 NOTE — Progress Notes (Signed)
[facility] [place], [place] [address]  Internal MEDICINE  Office Visit Note  Patient Name: Leslie [name]  [id number]  [id number]  Date of Service: 12/08/2021  Chief Complaint  Patient presents with   Follow-up   Gastroesophageal Reflux   Hypertension   Hyperlipidemia    HPI Pt is here for routine follow up after hospitalization a few weeks ago -She had gone to hospital due to dizziness and SOB plus headache and nausea. Found to have low potassium -Stopped spironolactone and her torsemide was also reduced -She has since had cardiology follow up and repeat labs done to follow potassium which was still low and cardiology increased her potassium supplement. Repeat labs have already been ordered by cardiology as well to follow up on this. -Has appt with Dr. Candiss Norse, nephrology in future -Cramping everywhere when potassium got really low. This has been getting better.  -She would really like to focus on losing weight and thinks this would help with her other health concerns. Might consider GLP1 as this would help her sugar and wt loss, although given HF and CKD a SGLT2 may be considered as well if labs stabilize. Will plan to recheck A1c next visit and discuss starting new medication if otherwise feeling well  Current Medication: Outpatient Encounter Medications as of 12/03/2021  Medication Sig Note   albuterol (VENTOLIN HFA) 108 (90 Base) MCG/ACT inhaler INHALE 2 PUFFS INTO THE LUNGS EVERY 6 HOURS AS NEEDED FOR WHEEZING OR SHORTNESS OF BREATH    ALPRAZolam (XANAX) 0.5 MG tablet TAKE 1/2 TABLET BY MOUTH AT BEDTIME AS NEEDED FOR ANXIETY    amLODipine (NORVASC) 10 MG tablet TAKE 1 TABLET(10 MG) BY MOUTH DAILY. (Patient taking differently: Take 10 mg by mouth daily.)    aspirin 81 MG EC tablet Take 81 mg by mouth daily.    atorvastatin (LIPITOR) 80 MG tablet Take 1 tablet (80 mg total) by mouth daily.    calcium carbonate (TUMS - DOSED IN MG ELEMENTAL CALCIUM) 500  MG chewable tablet Chew 500 mg by mouth daily as needed for indigestion or heartburn.    FEROCON capsule TAKE ONE CAPSULE BY MOUTH TWICE DAILY AFTER MEALS (Patient taking differently: Take 1 capsule by mouth 2 (two) times daily after a meal.)    metoprolol succinate (TOPROL-XL) 100 MG 24 hr tablet TAKE 1 TABLET(100 MG) BY MOUTH TWICE DAILY WITH OR IMMEDIATELY FOLLOWING A MEAL (Patient taking differently: Take 100 mg by mouth every evening.) 11/11/2021: Patient states she only takes once daily    omeprazole (PRILOSEC) 40 MG capsule TAKE ONE CAPSULE BY MOUTH DAILY    ondansetron (ZOFRAN-ODT) 4 MG disintegrating tablet Take 1 tablet (4 mg total) by mouth every 8 (eight) hours as needed for nausea or vomiting.    potassium chloride SA (KLOR-CON M) 20 MEQ tablet Take 4 tabs (80 MEQ) every AM, and take 3 tabs (60 MEQ) every PM.    sucralfate (CARAFATE) 1 g tablet Take 1 tablet (1 g total) by mouth 4 (four) times daily -  with meals and at bedtime.    torsemide (DEMADEX) 20 MG tablet Take 1 tablet (20 mg total) by mouth 2 (two) times daily.    warfarin (COUMADIN) 2 MG tablet TAKE 1/2 TO 1 TABLET BY MOUTH DAILY AS DIRECTED BY THE ANTI-COAG CLINIC (Patient taking differently: Take 1-2 mg by mouth See admin instructions. Take 2 mg every day every day except on Sunday Thursday take 1 mg per patient)    No  facility-administered encounter medications on file as of 12/03/2021.    Surgical History: Past Surgical History:  Procedure Laterality Date   ABDOMINAL HYSTERECTOMY     total   ABDOMINAL HYSTERECTOMY     BREAST BIOPSY Bilateral 2012   BREAST BIOPSY Right 08-07-12   fibroadenomatous changes and columnar cells   BREAST BIOPSY  02/03/2015   stereo byrnett   BUBBLE STUDY  04/01/2020   Procedure: BUBBLE STUDY;  Surgeon: [name], MD;  Location: [place];  Service: Cardiovascular;;   CARDIAC CATHETERIZATION     CHOLECYSTECTOMY N/A 02/27/2016   Procedure: LAPAROSCOPIC CHOLECYSTECTOMY;  Surgeon:  Christene Lye, MD;  Location: [facility]S;  Service: General;  Laterality: N/A;   CLIPPING OF ATRIAL APPENDAGE  04/29/2020   Procedure: CLIPPING OF ATRIAL APPENDAGE USING ATRICURE  PRO2 CLIP SIZE 45MM;  Surgeon: Rexene Alberts, MD;  Location: [facility] OR;  Service: Open Heart Surgery;;   COLONOSCOPY WITH PROPOFOL N/A 09/26/2018   Procedure: COLONOSCOPY WITH PROPOFOL;  Surgeon: Lucilla Lame, MD;  Location: [facility] ENDOSCOPY;  Service: Endoscopy;  Laterality: N/A;   COLONOSCOPY WITH PROPOFOL N/A 07/16/2021   Procedure: COLONOSCOPY WITH PROPOFOL;  Surgeon: Lucilla Lame, MD;  Location: [facility] ENDOSCOPY;  Service: Endoscopy;  Laterality: N/A;   CYSTOSCOPY W/ RETROGRADES Right 08/18/2018   Procedure: CYSTOSCOPY WITH RETROGRADE PYELOGRAM;  Surgeon: Billey Co, MD;  Location: [facility];  Service: Urology;  Laterality: Right;   CYSTOSCOPY/URETEROSCOPY/HOLMIUM LASER/STENT PLACEMENT Right 08/18/2018   Procedure: CYSTOSCOPY/URETEROSCOPY/STENT PLACEMENT;  Surgeon: Billey Co, MD;  Location: [facility];  Service: Urology;  Laterality: Right;   DIAGNOSTIC LAPAROSCOPY     ESOPHAGOGASTRODUODENOSCOPY N/A 07/16/2021   Procedure: ESOPHAGOGASTRODUODENOSCOPY (EGD);  Surgeon: Lucilla Lame, MD;  Location: [facility] ENDOSCOPY;  Service: Endoscopy;  Laterality: N/A;   ESOPHAGOGASTRODUODENOSCOPY (EGD) WITH PROPOFOL N/A 09/26/2018   Procedure: ESOPHAGOGASTRODUODENOSCOPY (EGD) WITH PROPOFOL;  Surgeon: Lucilla Lame, MD;  Location: [facility] ENDOSCOPY;  Service: Endoscopy;  Laterality: N/A;   GIVENS CAPSULE STUDY N/A 11/03/2018   Procedure: GIVENS CAPSULE STUDY;  Surgeon: Lucilla Lame, MD;  Location: [facility] ENDOSCOPY;  Service: Endoscopy;  Laterality: N/A;   JOINT REPLACEMENT Left    knee   KNEE ARTHROPLASTY Right 04/09/2019   Procedure: COMPUTER ASSISTED TOTAL KNEE ARTHROPLASTY;  Surgeon: Dereck Leep, MD;  Location: [facility];  Service: Orthopedics;  Laterality: Right;   KNEE CLOSED REDUCTION Left 04/15/2015   Procedure: CLOSED  MANIPULATION KNEE;  Surgeon: Thornton Park, MD;  Location: [facility] OR[facility];  Service: Orthopedics;  Laterality: Left;   KNEE SURGERY     MAZE N/A 04/29/2020   Procedure: MAZE;  Surgeon: Rexene Alberts, MD;  Location: [place];  Service: Open Heart Surgery;  Laterality: N/A;   MITRAL VALVE REPLACEMENT N/A 04/29/2020   Procedure: MITRAL VALVE (MV) REPLACEMENT USING ON-X VALVE SIZE 27/29MM;  Surgeon: Rexene Alberts, MD;  Location: [place];  Service: Open Heart Surgery;  Laterality: N/A;   MULTIPLE EXTRACTIONS WITH ALVEOLOPLASTY N/A 02/28/2020   Procedure: MULTIPLE EXTRACTION WITH ALVEOLOPLASTY;  Surgeon: [name], DMD;  Location: [place];  Service: Dentistry;  Laterality: N/A;   RIGHT/LEFT HEART CATH AND CORONARY ANGIOGRAPHY Bilateral 09/06/2019   Procedure: RIGHT/LEFT HEART CATH AND CORONARY ANGIOGRAPHY;  Surgeon: Minna Merritts, MD;  Location: [place] CV LAB;  Service: Cardiovascular;  Laterality: Bilateral;   TEE WITHOUT CARDIOVERSION N/A 09/13/2017   Procedure: TRANSESOPHAGEAL ECHOCARDIOGRAM (TEE);  Surgeon: Nelva Bush, MD;  Location: [facility];  Service: Cardiovascular;  Laterality: N/A;   TEE WITHOUT CARDIOVERSION  N/A 04/01/2020   Procedure: TRANSESOPHAGEAL ECHOCARDIOGRAM (TEE);  Surgeon: [name], MD;  Location: [facility] ENDOSCOPY;  Service: Cardiovascular;  Laterality: N/A;   TEE WITHOUT CARDIOVERSION N/A 04/29/2020   Procedure: TRANSESOPHAGEAL ECHOCARDIOGRAM (TEE);  Surgeon: Rexene Alberts, MD;  Location: [place];  Service: Open Heart Surgery;  Laterality: N/A;   TOTAL KNEE ARTHROPLASTY Left 12/25/2014   Procedure: TOTAL KNEE ARTHROPLASTY;  Surgeon: Thornton Park, MD;  Location: [facility] ORS;  Service: Orthopedics;  Laterality: Left;   TUBAL LIGATION      Medical History: Past Medical History:  Diagnosis Date   (HFpEF) heart failure with preserved ejection fraction ([place])    a. 08/2017 Echo: EF 55-60%.  Grade 2 diastolic dysfunction; b. 02/4578 Echo: EF 50-55%, no rwma, Nl RV  fxn, nl fxn'ing mech MV.   Allergy    Anemia    Anxiety    BRCA negative 03/22/2013   Bronchitis 02/19/2016   ON LEVAQUIN PO   Chest tightness    a. 08/2019 Cath: nl cors.   Cigarette smoker 09/11/2017   8-10 day   Dysrhythmia    GERD (gastroesophageal reflux disease)    History of kidney stones    Hyperlipidemia    Hypertension    Interstitial lung disease ([place])    a. CT 2013 b. 02/2018 CXR noted recurrent intersistial changes ILD vs chronic bronchitis   Moderate mitral stenosis    a.  08/2017 TEE: EF 60 to 65%.  Moderate mitral stenosis.  Mean gradient 14 mmHg.  Valve area 2.59 cm by planimetry, 2.72 cm by pressure half-time.   PAF (paroxysmal atrial fibrillation) ([place])    a. 08/2017 s/p TEE/DCCV; b. CHA2DS2VASc = 2-->warfarin.   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 04/2020 s/p 27/29 mm Onyx mech mitral valve-->chronic coumadin; b. 05/2020 Echo: Nl fxn'ing mech MV.    Family History: Family History  Problem Relation Age of Onset   Cancer Mother 49       breast   Hypertension Mother    Cancer Maternal Aunt        breast   Cancer Maternal Grandmother        breast   Osteoarthritis Father    Hypertension Father     Social History   Socioeconomic History   Marital status: Married    Spouse name: Not on file   Number of children: Not on file   Years of education: Not on file   Highest education level: Not on file  Occupational History   Not on file  Tobacco Use   Smoking status: Former    Packs/day: 0.50    Years: 20.00    Total pack years: 10.00    Types: Cigarettes    Quit date: 01/2016    Years since quitting: 5.8   Smokeless tobacco: Never  Vaping Use   Vaping Use: Never used  Substance and Sexual Activity   Alcohol use:  Yes    Comment: rarely   Drug use: No   Sexual activity: Not on file  Other Topics Concern   Not on file  Social History Narrative   Not on file   Social Determinants of Health   Financial Resource Strain: Low Risk  (05/07/2020)   Overall Financial Resource Strain (CARDIA)    Difficulty  of Paying Living Expenses: Not very hard  Food Insecurity: Unknown (11/16/2021)   Hunger Vital Sign    Worried About Running Out of Food in the Last Year: Not on file    Ran Out of Food in the Last Year: Never true  Transportation Needs: No Transportation Needs (11/16/2021)   PRAPARE - Hydrologist (Medical): No    Lack of Transportation (Non-Medical): No  Physical Activity: Not on file  Stress: Not on file  Social Connections: Not on file  Intimate Partner Violence: Not At Risk (11/16/2021)   Humiliation, Afraid, Rape, and Kick questionnaire    Fear of Current or Ex-Partner: No    Emotionally Abused: No    Physically Abused: No    Sexually Abused: No      Review of Systems  Constitutional:  Positive for fatigue. Negative for chills and unexpected weight change.  HENT:  Negative for congestion, postnasal drip, rhinorrhea, sneezing and sore throat.   Eyes:  Negative for redness.  Respiratory:  Negative for cough, chest tightness and shortness of breath.   Cardiovascular:  Negative for chest pain and palpitations.  Gastrointestinal:  Negative for abdominal pain, constipation, diarrhea, nausea and vomiting.  Genitourinary:  Negative for dysuria and frequency.  Musculoskeletal:  Positive for arthralgias. Negative for back pain, joint swelling and neck pain.  Skin:  Negative for rash.  Neurological: Negative.  Negative for tremors and numbness.  Hematological:  Negative for adenopathy. Does not bruise/bleed easily.  Psychiatric/Behavioral:  Negative for behavioral problems (Depression), sleep disturbance and suicidal ideas. The patient is not nervous/anxious.      Vital Signs: BP 134/81   Pulse 68   Temp 98.3 F (36.8 C)   Resp 16   Ht _0  (1.626 m)   Wt 248 lb 9.6 oz (112.8 kg)   SpO2 98%   BMI 42.67 kg/m    Physical Exam Vitals and nursing note reviewed.  Constitutional:      General: She is not in acute distress.    Appearance: Normal appearance. She is well-developed. She is obese. She is not diaphoretic.  HENT:     Head: Normocephalic and atraumatic.     Mouth/Throat:     Pharynx: No oropharyngeal exudate.  Eyes:     Pupils: Pupils are equal, round, and reactive to light.  Neck:     Thyroid: No thyromegaly.     Vascular: No JVD.     Trachea: No tracheal deviation.  Cardiovascular:     Rate and Rhythm: Normal rate and regular rhythm.     Heart sounds: Normal heart sounds. No murmur heard.    No friction rub. No gallop.  Pulmonary:     Effort: Pulmonary effort is normal. No respiratory distress.     Breath sounds: No wheezing or rales.  Chest:     Chest wall: No tenderness.  Abdominal:     General: Bowel sounds are normal.     Palpations: Abdomen is soft.  Musculoskeletal:        General: Normal range of motion.     Cervical back: Normal range of motion and neck supple.  Lymphadenopathy:     Cervical: No cervical adenopathy.  Skin:    General: Skin is warm and dry.  Neurological:     Mental Status: She is alert and oriented to person, place, and time.     Cranial Nerves: No cranial nerve deficit.  Psychiatric:        Behavior: Behavior  normal.        Thought Content: Thought content normal.        Judgment: Judgment normal.        Assessment/Plan: 1. Hypokalemia Likely secondary to diuretics, followed by cardiology with repeat labs already ordered  2. Chronic heart failure with preserved ejection fraction ([place]) Followed by cardiology  3. Stage 3 chronic kidney disease, unspecified whether stage 3a or 3b CKD ([place]) Followed by nephrology  4. Type 2 diabetes mellitus with hyperglycemia, without  long-term current use of insulin (HCC) Will plan to repeat A1c next visit and consider adding GLP1 or SGLT2 to help with sugar control as well as weight loss. Advised to work on diet and exercise   General Counseling: Monya verbalizes understanding of the findings of todays visit and agrees with plan of treatment. I have discussed any further diagnostic evaluation that may be needed or ordered today. We also reviewed her medications today. she has been encouraged to call the office with any questions or concerns that should arise related to todays visit.    No orders of the defined types were placed in this encounter.   No orders of the defined types were placed in this encounter.   This patient was seen by Drema Dallas, PA-C in collaboration with Dr. Clayborn Bigness as a part of collaborative care agreement.   Total time spent:30 Minutes Time spent includes review of chart, medications, test results, and follow up plan with the patient.      Dr Lavera Guise Internal medicine

## 2021-12-08 ENCOUNTER — Other Ambulatory Visit
Admission: RE | Admit: 2021-12-08 | Discharge: 2021-12-08 | Disposition: A | Discharge status: Home / Self Care | Payer: BLUE CROSS/BLUE SHIELD | Attending: Cardiology | Admitting: Cardiology

## 2021-12-08 DIAGNOSIS — E876 Hypokalemia: Secondary | ICD-10-CM | POA: Diagnosis present

## 2021-12-08 DIAGNOSIS — I5032 Chronic diastolic (congestive) heart failure: Secondary | ICD-10-CM | POA: Insufficient documentation

## 2021-12-08 LAB — BASIC METABOLIC PANEL
Anion gap: 14 (ref 5–15)
BUN: 55 mg/dL — ABNORMAL HIGH (ref 6–20)
CO2: 28 mmol/L (ref 22–32)
Calcium: 9.4 mg/dL (ref 8.9–10.3)
Chloride: 95 mmol/L — ABNORMAL LOW (ref 98–111)
Creatinine, Ser: 2.47 mg/dL — ABNORMAL HIGH (ref 0.44–1.00)
GFR, Estimated: 22 mL/min — ABNORMAL LOW (ref >60)
Glucose, Bld: 109 mg/dL — ABNORMAL HIGH (ref 70–99)
Potassium: 3.1 mmol/L — ABNORMAL LOW (ref 3.5–5.1)
Sodium: 137 mmol/L (ref 135–145)

## 2021-12-08 MED ORDER — SPIRONOLACTONE 25 MG PO TABS: 25.0000 mg | ORAL_TABLET | Freq: Every day | ORAL | 3 refills | Status: DC

## 2021-12-08 NOTE — Telephone Encounter (Signed)
-----   Message from Kavin Leech, RN sent at 12/08/2021  2:22 PM EST ----- Regarding: low potassium   ----- Message ----- From: Buel Ream, [facility] [place] Sent: 12/08/2021  12:23 PM EST To: Kate Sable, MD

## 2021-12-08 NOTE — Telephone Encounter (Signed)
Sent result note to Dr Rockey Situ, as she had a low potassium of 3.1 and is a patient of his. He recommended that she start taking Spironolactone 25 MG once a day, and get a repeat BMP lab drawn in 2 weeks.   Called patient and left a detailed VM per DPR on file, sent prescription in and encouraged a call back if she has any further questions or concerns.  I will follow up again tomorrow morning to be sure patient got the message.

## 2021-12-09 NOTE — Telephone Encounter (Signed)
Called patient and left a 2nd detailed VM .

## 2021-12-14 ENCOUNTER — Other Ambulatory Visit
Admission: RE | Admit: 2021-12-14 | Discharge: 2021-12-14 | Disposition: A | Discharge status: Home / Self Care | Payer: BLUE CROSS/BLUE SHIELD | Source: Ambulatory Visit | Attending: Nephrology | Admitting: Nephrology

## 2021-12-14 DIAGNOSIS — N1832 Chronic kidney disease, stage 3b: Secondary | ICD-10-CM | POA: Insufficient documentation

## 2021-12-14 DIAGNOSIS — R600 Localized edema: Secondary | ICD-10-CM | POA: Diagnosis not present

## 2021-12-14 DIAGNOSIS — I129 Hypertensive chronic kidney disease with stage 1 through stage 4 chronic kidney disease, or unspecified chronic kidney disease: Secondary | ICD-10-CM | POA: Diagnosis not present

## 2021-12-14 DIAGNOSIS — N179 Acute kidney failure, unspecified: Secondary | ICD-10-CM | POA: Diagnosis not present

## 2021-12-14 DIAGNOSIS — E876 Hypokalemia: Secondary | ICD-10-CM | POA: Insufficient documentation

## 2021-12-14 LAB — CBC
HCT: 31.9 % — ABNORMAL LOW (ref 36.0–46.0)
Hemoglobin: 10.7 g/dL — ABNORMAL LOW (ref 12.0–15.0)
MCH: 29.8 pg (ref 26.0–34.0)
MCHC: 33.5 g/dL (ref 30.0–36.0)
MCV: 88.9 fL (ref 80.0–100.0)
Platelets: 297 10*3/uL (ref 150–400)
RBC: 3.59 MIL/uL — ABNORMAL LOW (ref 3.87–5.11)
RDW: 13.4 % (ref 11.5–15.5)
WBC: 6 10*3/uL (ref 4.0–10.5)
nRBC: 0 % (ref 0.0–0.2)

## 2021-12-14 LAB — RENAL FUNCTION PANEL
Albumin: 4.5 g/dL (ref 3.5–5.0)
Anion gap: 10 (ref 5–15)
BUN: 36 mg/dL — ABNORMAL HIGH (ref 6–20)
CO2: 29 mmol/L (ref 22–32)
Calcium: 9.4 mg/dL (ref 8.9–10.3)
Chloride: 104 mmol/L (ref 98–111)
Creatinine, Ser: 1.94 mg/dL — ABNORMAL HIGH (ref 0.44–1.00)
GFR, Estimated: 30 mL/min — ABNORMAL LOW (ref >60)
Glucose, Bld: 94 mg/dL (ref 70–99)
Phosphorus: 2.5 mg/dL (ref 2.5–4.6)
Potassium: 3.8 mmol/L (ref 3.5–5.1)
Sodium: 143 mmol/L (ref 135–145)

## 2021-12-14 LAB — MAGNESIUM: Magnesium: 1.8 mg/dL (ref 1.7–2.4)

## 2021-12-14 LAB — IRON AND TIBC
Iron: 57 ug/dL (ref 28–170)
Saturation Ratios: 16 % (ref 10.4–31.8)
TIBC: 368 ug/dL (ref 250–450)
UIBC: 311 ug/dL

## 2021-12-15 ENCOUNTER — Telehealth: Payer: Self-pay | Admitting: Physician Assistant

## 2021-12-15 LAB — PTH, INTACT AND CALCIUM
Calcium, Total (PTH): 9.5 mg/dL (ref 8.7–10.2)
PTH: 60 pg/mL (ref 15–65)

## 2021-12-15 NOTE — Telephone Encounter (Signed)
MR faxed to [facility]; [phone]

## 2021-12-16 ENCOUNTER — Telehealth: Payer: BLUE CROSS/BLUE SHIELD | Admitting: Nurse Practitioner

## 2021-12-16 ENCOUNTER — Encounter: Payer: Self-pay | Admitting: Nurse Practitioner

## 2021-12-16 ENCOUNTER — Ambulatory Visit: Payer: BLUE CROSS/BLUE SHIELD

## 2021-12-16 VITALS — Ht 64.0 in | Wt 237.0 lb

## 2021-12-16 DIAGNOSIS — J0110 Acute frontal sinusitis, unspecified: Secondary | ICD-10-CM | POA: Diagnosis not present

## 2021-12-16 DIAGNOSIS — R051 Acute cough: Secondary | ICD-10-CM

## 2021-12-16 DIAGNOSIS — R062 Wheezing: Secondary | ICD-10-CM | POA: Diagnosis not present

## 2021-12-16 MED ORDER — HYDROCOD POLI-CHLORPHE POLI ER 10-8 MG/5ML PO SUER: 5.0000 mL | Freq: Two times a day (BID) | ORAL | 0 refills | Status: DC | PRN

## 2021-12-16 MED ORDER — FLUCONAZOLE 150 MG PO TABS: 150.0000 mg | ORAL_TABLET | Freq: Once | ORAL | 0 refills | Status: AC

## 2021-12-16 MED ORDER — AMOXICILLIN-POT CLAVULANATE 875-125 MG PO TABS: 1.0000 | ORAL_TABLET | Freq: Two times a day (BID) | ORAL | 0 refills | Status: DC

## 2021-12-16 MED ORDER — PREDNISONE 10 MG PO TABS: ORAL_TABLET | ORAL | 0 refills | Status: DC

## 2021-12-16 NOTE — Progress Notes (Signed)
[facility] [place], [place] [address]  Internal MEDICINE  Telephone Visit  Patient Name: Leslie [name]  [id number]  [id number]  Date of Service: 12/16/2021  I connected with the patient at 1245 by telephone and verified the patients identity using two identifiers.   I discussed the limitations, risks, security and privacy concerns of performing an evaluation and management service by telephone and the availability of in person appointments. I also discussed with the patient that there may be a patient responsible charge related to the service.  The patient expressed understanding and agrees to proceed.    Chief Complaint  Patient presents with   Telephone Assessment    Covid test is negative     Telephone Screen    [phone]   Sinusitis    Sinusitis Associated symptoms include chills, congestion, coughing, headaches, shortness of breath, sinus pressure and a sore throat.   Leslie [name] presents for a telehealth virtual visit for symptoms of sinusitis.  --covid test was negative -- reports cough, chest tightness and SOB, headache, wheezing --Onset was a few days ago --husband is sick too.    Current Medication: Outpatient Encounter Medications as of 12/16/2021  Medication Sig Note   albuterol (VENTOLIN HFA) 108 (90 Base) MCG/ACT inhaler INHALE 2 PUFFS INTO THE LUNGS EVERY 6 HOURS AS NEEDED FOR WHEEZING OR SHORTNESS OF BREATH    ALPRAZolam (XANAX) 0.5 MG tablet TAKE 1/2 TABLET BY MOUTH AT BEDTIME AS NEEDED FOR ANXIETY    amLODipine (NORVASC) 10 MG tablet TAKE 1 TABLET(10 MG) BY MOUTH DAILY. (Patient taking differently: Take 10 mg by mouth daily.)    amoxicillin-clavulanate (AUGMENTIN) 875-125 MG tablet Take 1 tablet by mouth 2 (two) times daily.    aspirin 81 MG EC tablet Take 81 mg by mouth daily.    atorvastatin (LIPITOR) 80 MG tablet Take 1 tablet (80 mg total) by mouth daily.    calcium carbonate (TUMS - DOSED IN MG ELEMENTAL CALCIUM) 500 MG  chewable tablet Chew 500 mg by mouth daily as needed for indigestion or heartburn.    chlorpheniramine-HYDROcodone (TUSSIONEX) 10-8 MG/5ML Take 5 mLs by mouth every 12 (twelve) hours as needed.    FEROCON capsule TAKE ONE CAPSULE BY MOUTH TWICE DAILY AFTER MEALS (Patient taking differently: Take 1 capsule by mouth 2 (two) times daily after a meal.)    fluconazole (DIFLUCAN) 150 MG tablet Take 1 tablet (150 mg total) by mouth once for 1 dose. May take an additional dose after 3 days if still symptomatic.    metoprolol succinate (TOPROL-XL) 100 MG 24 hr tablet TAKE 1 TABLET(100 MG) BY MOUTH TWICE DAILY WITH OR IMMEDIATELY FOLLOWING A MEAL (Patient taking differently: Take 100 mg by mouth every evening.) 11/11/2021: Patient states she only takes once daily    omeprazole (PRILOSEC) 40 MG capsule TAKE ONE CAPSULE BY MOUTH DAILY    ondansetron (ZOFRAN-ODT) 4 MG disintegrating tablet Take 1 tablet (4 mg total) by mouth every 8 (eight) hours as needed for nausea or vomiting.    potassium chloride SA (KLOR-CON M) 20 MEQ tablet Take 4 tabs (80 MEQ) every AM, and take 3 tabs (60 MEQ) every PM.    predniSONE (DELTASONE) 10 MG tablet Take one tab 3 x day for 3 days, then take one tab 2 x a day for 3 days and then take one tab a day for 3 days for copd    spironolactone (ALDACTONE) 25 MG tablet Take 1 tablet (25 mg total) by mouth daily.  sucralfate (CARAFATE) 1 g tablet Take 1 tablet (1 g total) by mouth 4 (four) times daily -  with meals and at bedtime.    torsemide (DEMADEX) 20 MG tablet Take 1 tablet (20 mg total) by mouth 2 (two) times daily.    warfarin (COUMADIN) 2 MG tablet TAKE 1/2 TO 1 TABLET BY MOUTH DAILY AS DIRECTED BY THE ANTI-COAG CLINIC (Patient taking differently: Take 1-2 mg by mouth See admin instructions. Take 2 mg every day every day except on Sunday Thursday take 1 mg per patient)    No facility-administered encounter medications on file as of 12/16/2021.    Surgical History: Past  Surgical History:  Procedure Laterality Date   ABDOMINAL HYSTERECTOMY     total   ABDOMINAL HYSTERECTOMY     BREAST BIOPSY Bilateral 2012   BREAST BIOPSY Right 08-07-12   fibroadenomatous changes and columnar cells   BREAST BIOPSY  02/03/2015   stereo byrnett   BUBBLE STUDY  04/01/2020   Procedure: BUBBLE STUDY;  Surgeon: [name], MD;  Location: [place];  Service: Cardiovascular;;   CARDIAC CATHETERIZATION     CHOLECYSTECTOMY N/A 02/27/2016   Procedure: LAPAROSCOPIC CHOLECYSTECTOMY;  Surgeon: Christene Lye, MD;  Location: [facility] ORS;  Service: General;  Laterality: N/A;   CLIPPING OF ATRIAL APPENDAGE  04/29/2020   Procedure: CLIPPING OF ATRIAL APPENDAGE USING ATRICURE  PRO2 CLIP SIZE 45MM;  Surgeon: Rexene Alberts, MD;  Location: [facility] OR;  Service: Open Heart Surgery;;   COLONOSCOPY WITH PROPOFOL N/A 09/26/2018   Procedure: COLONOSCOPY WITH PROPOFOL;  Surgeon: Lucilla Lame, MD;  Location[facility] ENDOSCOPY;  Service: Endoscopy;  Laterality: N/A;   COLONOSCOPY WITH PROPOFOL N/A 07/16/2021   Procedure: COLONOSCOPY WITH PROPOFOL;  Surgeon: Lucilla Lame, MD;  Location: [facility] ENDOSCOPY;  Service: Endoscopy;  Laterality: N/A;   CYSTOSCOPY W/ RETROGRADES Right 08/18/2018   Procedure: CYSTOSCOPY WITH RETROGRADE PYELOGRAM;  Surgeon: Billey Co, MD;  Location: [facility] ORS;  Service: Urology;  Laterality: Right;   CYSTOSCOPY/URETEROSCOPY/HOLMIUM LASER/STENT PLACEMENT Right 08/18/2018   Procedure: CYSTOSCOPY/URETEROSCOPY/STENT PLACEMENT;  Surgeon: Billey Co, MD;  Location: [facility] ORS;  Service: Urology;  Laterality: Right;   DIAGNOSTIC LAPAROSCOPY     ESOPHAGOGASTRODUODENOSCOPY N/A 07/16/2021   Procedure: ESOPHAGOGASTRODUODENOSCOPY (EGD);  Surgeon: Lucilla Lame, MD;  Location: [facility] ENDOSCOPY;  Service: Endoscopy;  Laterality: N/A;   ESOPHAGOGASTRODUODENOSCOPY (EGD) WITH PROPOFOL N/A 09/26/2018   Procedure: ESOPHAGOGASTRODUODENOSCOPY (EGD) WITH PROPOFOL;  Surgeon: Lucilla Lame, MD;   Location: [facility] ENDOSCOPY;  Service: Endoscopy;  Laterality: N/A;   GIVENS CAPSULE STUDY N/A 11/03/2018   Procedure: GIVENS CAPSULE STUDY;  Surgeon: Lucilla Lame, MD;  Location: [facility] ENDOSCOPY;  Service: Endoscopy;  Laterality: N/A;   JOINT REPLACEMENT Left    knee   KNEE ARTHROPLASTY Right 04/09/2019   Procedure: COMPUTER ASSISTED TOTAL KNEE ARTHROPLASTY;  Surgeon: Dereck Leep, MD;  Location: [facility] ORS;  Service: Orthopedics;  Laterality: Right;   KNEE CLOSED REDUCTION Left 04/15/2015   Procedure: CLOSED MANIPULATION KNEE;  Surgeon: Thornton Park, MD;  Location: [facility] ORS;  Service: Orthopedics;  Laterality: Left;   KNEE SURGERY     MAZE N/A 04/29/2020   Procedure: MAZE;  Surgeon: Rexene Alberts, MD;  Location: [place];  Service: Open Heart Surgery;  Laterality: N/A;   MITRAL VALVE REPLACEMENT N/A 04/29/2020   Procedure: MITRAL VALVE (MV) REPLACEMENT USING ON-X VALVE SIZE 27/29MM;  Surgeon: Rexene Alberts, MD;  Location: [place];  Service: Open Heart Surgery;  Laterality: N/A;   MULTIPLE EXTRACTIONS  WITH ALVEOLOPLASTY N/A 02/28/2020   Procedure: MULTIPLE EXTRACTION WITH ALVEOLOPLASTY;  Surgeon: [name], DMD;  Location: [place];  Service: Dentistry;  Laterality: N/A;   RIGHT/LEFT HEART CATH AND CORONARY ANGIOGRAPHY Bilateral 09/06/2019   Procedure: RIGHT/LEFT HEART CATH AND CORONARY ANGIOGRAPHY;  Surgeon: Minna Merritts, MD;  Location: [place] CV LAB;  Service: Cardiovascular;  Laterality: Bilateral;   TEE WITHOUT CARDIOVERSION N/A 09/13/2017   Procedure: TRANSESOPHAGEAL ECHOCARDIOGRAM (TEE);  Surgeon: Nelva Bush, MD;  Location: [facility] ORS;  Service: Cardiovascular;  Laterality: N/A;   TEE WITHOUT CARDIOVERSION N/A 04/01/2020   Procedure: TRANSESOPHAGEAL ECHOCARDIOGRAM (TEE);  Surgeon: [name], MD;  Location: [facility] ENDOSCOPY;  Service: Cardiovascular;  Laterality: N/A;   TEE WITHOUT CARDIOVERSION N/A 04/29/2020   Procedure: TRANSESOPHAGEAL ECHOCARDIOGRAM (TEE);  Surgeon:  Rexene Alberts, MD;  Location: [place];  Service: Open Heart Surgery;  Laterality: N/A;   TOTAL KNEE ARTHROPLASTY Left 12/25/2014   Procedure: TOTAL KNEE ARTHROPLASTY;  Surgeon: Thornton Park, MD;  Location: [facility] ORS;  Service: Orthopedics;  Laterality: Left;   TUBAL LIGATION      Medical History: Past Medical History:  Diagnosis Date   (HFpEF) heart failure with preserved ejection fraction ([place])    a. 08/2017 Echo: EF 55-60%.  Grade 2 diastolic dysfunction; b. 01/173 Echo: EF 50-55%, no rwma, Nl RV fxn, nl fxn'ing mech MV.   Allergy    Anemia    Anxiety    BRCA negative 03/22/2013   Bronchitis 02/19/2016   ON LEVAQUIN PO   Chest tightness    a. 08/2019 Cath: nl cors.   Cigarette smoker 09/11/2017   8-10 day   Dysrhythmia    GERD (gastroesophageal reflux disease)    History of kidney stones    Hyperlipidemia    Hypertension    Interstitial lung disease ([place])    a. CT 2013 b. 02/2018 CXR noted recurrent intersistial changes ILD vs chronic bronchitis   Moderate mitral stenosis    a.  08/2017 TEE: EF 60 to 65%.  Moderate mitral stenosis.  Mean gradient 14 mmHg.  Valve area 2.59 cm by planimetry, 2.72 cm by pressure half-time.   PAF (paroxysmal atrial fibrillation) ([place])    a. 08/2017 s/p TEE/DCCV; b. CHA2DS2VASc = 2-->warfarin.   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 04/2020 s/p 27/29 mm Onyx mech mitral valve-->chronic coumadin; b. 05/2020 Echo: Nl fxn'ing mech MV.    Family History: Family History  Problem Relation Age of Onset   Cancer Mother 97       breast   Hypertension Mother    Cancer Maternal Aunt        breast   Cancer Maternal Grandmother        breast   Osteoarthritis Father    Hypertension Father     Social History    Socioeconomic History   Marital status: Married    Spouse name: Not on file   Number of children: Not on file   Years of education: Not on file   Highest education level: Not on file  Occupational History   Not on file  Tobacco Use   Smoking status: Former    Packs/day: 0.50    Years: 20.00    Total pack years: 10.00    Types: Cigarettes  Quit date: 01/2016    Years since quitting: 5.9   Smokeless tobacco: Never  Vaping Use   Vaping Use: Never used  Substance and Sexual Activity   Alcohol use: Yes    Comment: rarely   Drug use: No   Sexual activity: Not on file  Other Topics Concern   Not on file  Social History Narrative   Not on file   Social Determinants of Health   Financial Resource Strain: Low Risk  (05/07/2020)   Overall Financial Resource Strain (CARDIA)    Difficulty of Paying Living Expenses: Not very hard  Food Insecurity: Unknown (11/16/2021)   Hunger Vital Sign    Worried About Running Out of Food in the Last Year: Not on file    Ran Out of Food in the Last Year: Never true  Transportation Needs: No Transportation Needs (11/16/2021)   PRAPARE - Hydrologist (Medical): No    Lack of Transportation (Non-Medical): No  Physical Activity: Not on file  Stress: Not on file  Social Connections: Not on file  Intimate Partner Violence: Not At Risk (11/16/2021)   Humiliation, Afraid, Rape, and Kick questionnaire    Fear of Current or Ex-Partner: No    Emotionally Abused: No    Physically Abused: No    Sexually Abused: No      Review of Systems  Constitutional:  Positive for chills and fatigue.  HENT:  Positive for congestion, postnasal drip, rhinorrhea, sinus pressure, sinus pain and sore throat.   Respiratory:  Positive for cough, chest tightness, shortness of breath and wheezing.   Cardiovascular: Negative.  Negative for chest pain and palpitations.  Gastrointestinal: Negative.   Neurological:  Positive for headaches.     Vital Signs: Ht _0  (1.626 m)   Wt 237 lb (107.5 kg)   BMI 40.68 kg/m    Observation/Objective:     Assessment/Plan: 1. Acute non-recurrent frontal sinusitis Empiric antibiotic treatment prescribed - amoxicillin-clavulanate (AUGMENTIN) 875-125 MG tablet; Take 1 tablet by mouth 2 (two) times daily.  Dispense: 20 tablet; Refill: 0 - fluconazole (DIFLUCAN) 150 MG tablet; Take 1 tablet (150 mg total) by mouth once for 1 dose. May take an additional dose after 3 days if still symptomatic.  Dispense: 3 tablet; Refill: 0  2. Acute cough Medication prescribed for symptomatic relief of cough - chlorpheniramine-HYDROcodone (TUSSIONEX) 10-8 MG/5ML; Take 5 mLs by mouth every 12 (twelve) hours as needed.  Dispense: 140 mL; Refill: 0  3. Wheezing Prednisone taper prescribed to decrease inflammation and wheezing - predniSONE (DELTASONE) 10 MG tablet; Take one tab 3 x day for 3 days, then take one tab 2 x a day for 3 days and then take one tab a day for 3 days for copd  Dispense: 18 tablet; Refill: 0   General Counseling: Nohea verbalizes understanding of the findings of today's phone visit and agrees with plan of treatment. I have discussed any further diagnostic evaluation that may be needed or ordered today. We also reviewed her medications today. she has been encouraged to call the office with any questions or concerns that should arise related to todays visit.  Return if symptoms worsen or fail to improve.   No orders of the defined types were placed in this encounter.   Meds ordered this encounter  Medications   chlorpheniramine-HYDROcodone (TUSSIONEX) 10-8 MG/5ML    Sig: Take 5 mLs by mouth every 12 (twelve) hours as needed.    Dispense:  140 mL  Refill:  0   amoxicillin-clavulanate (AUGMENTIN) 875-125 MG tablet    Sig: Take 1 tablet by mouth 2 (two) times daily.    Dispense:  20 tablet    Refill:  0   predniSONE (DELTASONE) 10 MG tablet    Sig: Take one tab 3 x day  for 3 days, then take one tab 2 x a day for 3 days and then take one tab a day for 3 days for copd    Dispense:  18 tablet    Refill:  0   fluconazole (DIFLUCAN) 150 MG tablet    Sig: Take 1 tablet (150 mg total) by mouth once for 1 dose. May take an additional dose after 3 days if still symptomatic.    Dispense:  3 tablet    Refill:  0    Time spent:10 Minutes Time spent with patient included reviewing progress notes, labs, imaging studies, and discussing plan for follow up.  [place] Controlled Substance Database was reviewed by me for overdose risk score (ORS) if appropriate.  This patient was seen by Jonetta Osgood, FNP-C in collaboration with Dr. Clayborn Bigness as a part of collaborative care agreement.  Shantoria Ellwood R. Valetta Fuller, MSN, FNP-C Internal medicine

## 2021-12-21 ENCOUNTER — Telehealth: Payer: Self-pay | Admitting: Physician Assistant

## 2021-12-21 NOTE — Telephone Encounter (Signed)
Received Medical request form from [facility].Gave to Lauren to complete-Toni

## 2021-12-21 NOTE — Telephone Encounter (Signed)
Per patient, we are not to fill out form for [place] [employer]. Per patient, form was put in shred bin-Toni

## 2021-12-23 ENCOUNTER — Ambulatory Visit (INDEPENDENT_AMBULATORY_CARE_PROVIDER_SITE_OTHER): Payer: BLUE CROSS/BLUE SHIELD | Admitting: Dermatology

## 2021-12-23 ENCOUNTER — Ambulatory Visit: Payer: BLUE CROSS/BLUE SHIELD | Attending: Cardiovascular Disease

## 2021-12-23 DIAGNOSIS — I050 Rheumatic mitral stenosis: Secondary | ICD-10-CM | POA: Diagnosis not present

## 2021-12-23 DIAGNOSIS — Z5181 Encounter for therapeutic drug level monitoring: Secondary | ICD-10-CM | POA: Diagnosis not present

## 2021-12-23 DIAGNOSIS — Z7189 Other specified counseling: Secondary | ICD-10-CM | POA: Diagnosis not present

## 2021-12-23 DIAGNOSIS — L910 Hypertrophic scar: Secondary | ICD-10-CM

## 2021-12-23 DIAGNOSIS — I4891 Unspecified atrial fibrillation: Secondary | ICD-10-CM

## 2021-12-23 LAB — POCT INR: INR: 3.9 — AB (ref 2.0–3.0)

## 2021-12-23 NOTE — Patient Instructions (Signed)
HOLD TONIGHT ONLY THEN continue dosage warfarin 1 whole tablet daily EXCEPT 1/2 tablet on MONDAYS, WEDNESDAYS & FRIDAYS;   - Recheck INR in 6 weeks

## 2021-12-23 NOTE — Progress Notes (Signed)
   New Patient Visit  Subjective  Leslie [name] is a 56 y.o. female who presents for the following: Keloid (Chest/sternum - very itchy and irritated patient would like to discuss treatment options today).  The following portions of the chart were reviewed this encounter and updated as appropriate:   Tobacco  Allergies  Meds  Problems  Med Hx  Surg Hx  Fam Hx     Review of Systems:  No other skin or systemic complaints except as noted in HPI or Assessment and Plan.  Objective  Well appearing patient in no apparent distress; mood and affect are within normal limits.  A focused examination was performed including the face and chest. Relevant physical exam findings are noted in the Assessment and Plan.  Chest/sternum Firm pink/brown dermal papule(s)/plaque(s).       Assessment & Plan  Keloid Chest/sternum Symptomatic - patient would like treated today  Discussed treatment options of topical steroids, ILK injections,  Laser and radiation treatment.  Intralesional injection - Chest/sternum Location: Sternum/chest  Informed Consent: Discussed risks (infection, pain, bleeding, bruising, thinning of the skin, loss of skin pigment, lack of resolution, and recurrence of lesion) and benefits of the procedure, as well as the alternatives. Informed consent was obtained. Preparation: The area was prepared a standard fashion.  Procedure Details: An intralesional injection was performed with Kenalog 10 mg/cc. 3.0 cc in total were injected.  Total number of injections: >15  Plan: The patient was instructed on post-op care. Recommend OTC analgesia as needed for pain. [place] [id number] Expiration date: 11/2023 Lot [id number]  Return in about 2 months (around 02/23/2022) for keloid follow up.  Luther Redo, CMA, am acting as scribe for Sarina Ser, MD . Documentation: I have reviewed the above documentation for accuracy and completeness, and I agree with the above.  Sarina Ser, MD

## 2021-12-23 NOTE — Patient Instructions (Signed)
Due to recent changes in healthcare laws, you may see results of your pathology and/or laboratory studies on MyChart before the doctors have had a chance to review them. We understand that in some cases there may be results that are confusing or concerning to you. Please understand that not all results are received at the same time and often the doctors may need to interpret multiple results in order to provide you with the best plan of care or course of treatment. Therefore, we ask that you please give us 2 business days to thoroughly review all your results before contacting the office for clarification. Should we see a critical lab result, you will be contacted sooner.   If You Need Anything After Your Visit  If you have any questions or concerns for your doctor, please call our main line at [phone] and press option 4 to reach your doctor's medical assistant. If no one answers, please leave a voicemail as directed and we will return your call as soon as possible. Messages left after 4 pm will be answered the following business day.   You may also send us a message via MyChart. We typically respond to MyChart messages within 1-2 business days.  For prescription refills, please ask your pharmacy to contact our office. Our fax number is [phone].  If you have an urgent issue when the clinic is closed that cannot wait until the next business day, you can page your doctor at the number below.    Please note that while we do our best to be available for urgent issues outside of office hours, we are not available 24/7.   If you have an urgent issue and are unable to reach us, you may choose to seek medical care at your doctor's office, retail clinic, urgent care center, or emergency room.  If you have a medical emergency, please immediately call 911 or go to the emergency department.  Pager Numbers  - Dr. Kowalski: [phone]  - Dr. Moye: [phone]  - Dr. Stewart:  [phone]  In the event of inclement weather, please call our main line at [phone] for an update on the status of any delays or closures.  Dermatology Medication Tips: Please keep the boxes that topical medications come in in order to help keep track of the instructions about where and how to use these. Pharmacies typically print the medication instructions only on the boxes and not directly on the medication tubes.   If your medication is too expensive, please contact our office at [phone] option 4 or send us a message through MyChart.   We are unable to tell what your co-pay for medications will be in advance as this is different depending on your insurance coverage. However, we may be able to find a substitute medication at lower cost or fill out paperwork to get insurance to cover a needed medication.   If a prior authorization is required to get your medication covered by your insurance company, please allow us 1-2 business days to complete this process.  Drug prices often vary depending on where the prescription is filled and some pharmacies may offer cheaper prices.  The website www.goodrx.com contains coupons for medications through different pharmacies. The prices here do not account for what the cost may be with help from insurance (it may be cheaper with your insurance), but the website can give you the price if you did not use any insurance.  - You can print the associated coupon and take it with   your prescription to the pharmacy.  - You may also stop by our office during regular business hours and pick up a GoodRx coupon card.  - If you need your prescription sent electronically to a different pharmacy, notify our office through [place] MyChart or by phone at [phone] option 4.     Si Usted Necesita Algo Despus de Su Visita  Tambin puede enviarnos un mensaje a travs de MyChart. Por lo general respondemos a los mensajes de MyChart en el transcurso de 1 a 2  das hbiles.  Para renovar recetas, por favor pida a su farmacia que se ponga en contacto con nuestra oficina. Nuestro nmero de fax es el [phone].  Si tiene un asunto urgente cuando la clnica est cerrada y que no puede esperar hasta el siguiente da hbil, puede llamar/localizar a su doctor(a) al nmero que aparece a continuacin.   Por favor, tenga en cuenta que aunque hacemos todo lo posible para estar disponibles para asuntos urgentes fuera del horario de oficina, no estamos disponibles las 24 horas del da, los 7 das de la semana.   Si tiene un problema urgente y no puede comunicarse con nosotros, puede optar por buscar atencin mdica  en el consultorio de su doctor(a), en una clnica privada, en un centro de atencin urgente o en una sala de emergencias.  Si tiene una emergencia mdica, por favor llame inmediatamente al 911 o vaya a la sala de emergencias.  Nmeros de bper  - Dr. Kowalski: [phone]  - Dra. Moye: [phone]  - Dra. Stewart: [phone]  En caso de inclemencias del tiempo, por favor llame a nuestra lnea principal al [phone] para una actualizacin sobre el estado de cualquier retraso o cierre.  Consejos para la medicacin en dermatologa: Por favor, guarde las cajas en las que vienen los medicamentos de uso tpico para ayudarle a seguir las instrucciones sobre dnde y cmo usarlos. Las farmacias generalmente imprimen las instrucciones del medicamento slo en las cajas y no directamente en los tubos del medicamento.   Si su medicamento es muy caro, por favor, pngase en contacto con nuestra oficina llamando al [phone] y presione la opcin 4 o envenos un mensaje a travs de MyChart.   No podemos decirle cul ser su copago por los medicamentos por adelantado ya que esto es diferente dependiendo de la cobertura de su seguro. Sin embargo, es posible que podamos encontrar un medicamento sustituto a menor costo o llenar un formulario para que el  seguro cubra el medicamento que se considera necesario.   Si se requiere una autorizacin previa para que su compaa de seguros cubra su medicamento, por favor permtanos de 1 a 2 das hbiles para completar este proceso.  Los precios de los medicamentos varan con frecuencia dependiendo del lugar de dnde se surte la receta y alguna farmacias pueden ofrecer precios ms baratos.  El sitio web www.goodrx.com tiene cupones para medicamentos de diferentes farmacias. Los precios aqu no tienen en cuenta lo que podra costar con la ayuda del seguro (puede ser ms barato con su seguro), pero el sitio web puede darle el precio si no utiliz ningn seguro.  - Puede imprimir el cupn correspondiente y llevarlo con su receta a la farmacia.  - Tambin puede pasar por nuestra oficina durante el horario de atencin regular y recoger una tarjeta de cupones de GoodRx.  - Si necesita que su receta se enve electrnicamente a una farmacia diferente, informe a nuestra oficina a travs de MyChart de [place]   o por telfono llamando al [phone] y presione la opcin 4.  

## 2021-12-28 ENCOUNTER — Other Ambulatory Visit: Payer: Self-pay | Admitting: Physician Assistant

## 2021-12-28 DIAGNOSIS — F419 Anxiety disorder, unspecified: Secondary | ICD-10-CM

## 2021-12-28 NOTE — Telephone Encounter (Signed)
Last 11/23 next 12/31/21

## 2021-12-28 NOTE — Telephone Encounter (Signed)
Next 12/31/21

## 2021-12-31 ENCOUNTER — Ambulatory Visit: Payer: BLUE CROSS/BLUE SHIELD | Admitting: Physician Assistant

## 2022-01-03 ENCOUNTER — Encounter: Payer: Self-pay | Admitting: Dermatology

## 2022-01-12 ENCOUNTER — Encounter (HOSPITAL_COMMUNITY): Payer: Self-pay | Admitting: Emergency Medicine

## 2022-01-12 ENCOUNTER — Emergency Department (HOSPITAL_COMMUNITY)
Admission: EM | Admit: 2022-01-12 | Discharge: 2022-01-12 | Disposition: A | Discharge status: Home / Self Care | Payer: BLUE CROSS/BLUE SHIELD | Attending: Emergency Medicine | Admitting: Emergency Medicine

## 2022-01-12 ENCOUNTER — Emergency Department (HOSPITAL_COMMUNITY): Payer: BLUE CROSS/BLUE SHIELD

## 2022-01-12 ENCOUNTER — Ambulatory Visit: Payer: BLUE CROSS/BLUE SHIELD | Admitting: Cardiovascular Disease

## 2022-01-12 DIAGNOSIS — I110 Hypertensive heart disease with heart failure: Secondary | ICD-10-CM | POA: Insufficient documentation

## 2022-01-12 DIAGNOSIS — Z7901 Long term (current) use of anticoagulants: Secondary | ICD-10-CM | POA: Diagnosis not present

## 2022-01-12 DIAGNOSIS — Z1152 Encounter for screening for COVID-19: Secondary | ICD-10-CM | POA: Diagnosis not present

## 2022-01-12 DIAGNOSIS — E860 Dehydration: Secondary | ICD-10-CM | POA: Insufficient documentation

## 2022-01-12 DIAGNOSIS — J111 Influenza due to unidentified influenza virus with other respiratory manifestations: Secondary | ICD-10-CM

## 2022-01-12 DIAGNOSIS — Z7982 Long term (current) use of aspirin: Secondary | ICD-10-CM | POA: Insufficient documentation

## 2022-01-12 DIAGNOSIS — J101 Influenza due to other identified influenza virus with other respiratory manifestations: Secondary | ICD-10-CM | POA: Insufficient documentation

## 2022-01-12 DIAGNOSIS — I5030 Unspecified diastolic (congestive) heart failure: Secondary | ICD-10-CM | POA: Diagnosis not present

## 2022-01-12 DIAGNOSIS — Z87891 Personal history of nicotine dependence: Secondary | ICD-10-CM | POA: Diagnosis not present

## 2022-01-12 DIAGNOSIS — R059 Cough, unspecified: Secondary | ICD-10-CM | POA: Diagnosis present

## 2022-01-12 HISTORY — DX: Unspecified asthma, uncomplicated: J45.909

## 2022-01-12 LAB — CBC WITH DIFFERENTIAL/PLATELET
Abs Immature Granulocytes: 0.02 10*3/uL (ref 0.00–0.07)
Basophils Absolute: 0.1 10*3/uL (ref 0.0–0.1)
Basophils Relative: 1 %
Eosinophils Absolute: 0 10*3/uL (ref 0.0–0.5)
Eosinophils Relative: 0 %
HCT: 35.7 % — ABNORMAL LOW (ref 36.0–46.0)
Hemoglobin: 11.8 g/dL — ABNORMAL LOW (ref 12.0–15.0)
Immature Granulocytes: 0 %
Lymphocytes Relative: 24 %
Lymphs Abs: 1.6 10*3/uL (ref 0.7–4.0)
MCH: 30.3 pg (ref 26.0–34.0)
MCHC: 33.1 g/dL (ref 30.0–36.0)
MCV: 91.8 fL (ref 80.0–100.0)
Monocytes Absolute: 1 10*3/uL (ref 0.1–1.0)
Monocytes Relative: 16 %
Neutro Abs: 3.8 10*3/uL (ref 1.7–7.7)
Neutrophils Relative %: 59 %
Platelets: 304 10*3/uL (ref 150–400)
RBC: 3.89 MIL/uL (ref 3.87–5.11)
RDW: 14.1 % (ref 11.5–15.5)
WBC: 6.5 10*3/uL (ref 4.0–10.5)
nRBC: 0 % (ref 0.0–0.2)

## 2022-01-12 LAB — BASIC METABOLIC PANEL
Anion gap: 14 (ref 5–15)
BUN: 43 mg/dL — ABNORMAL HIGH (ref 6–20)
CO2: 24 mmol/L (ref 22–32)
Calcium: 9.1 mg/dL (ref 8.9–10.3)
Chloride: 100 mmol/L (ref 98–111)
Creatinine, Ser: 2.32 mg/dL — ABNORMAL HIGH (ref 0.44–1.00)
GFR, Estimated: 24 mL/min — ABNORMAL LOW (ref >60)
Glucose, Bld: 90 mg/dL (ref 70–99)
Potassium: 4.2 mmol/L (ref 3.5–5.1)
Sodium: 138 mmol/L (ref 135–145)

## 2022-01-12 LAB — RESP PANEL BY RT-PCR (RSV, FLU A&B, COVID)  RVPGX2
Influenza A by PCR: POSITIVE — AB
Influenza B by PCR: NEGATIVE
Resp Syncytial Virus by PCR: NEGATIVE
SARS Coronavirus 2 by RT PCR: NEGATIVE

## 2022-01-12 LAB — BRAIN NATRIURETIC PEPTIDE: B Natriuretic Peptide: 83.7 pg/mL (ref 0.0–100.0)

## 2022-01-12 MED ORDER — OSELTAMIVIR PHOSPHATE 75 MG PO CAPS: 75.0000 mg | ORAL_CAPSULE | Freq: Every day | ORAL | 0 refills | Status: DC

## 2022-01-12 NOTE — ED Provider Triage Note (Signed)
Emergency Medicine Provider Triage Evaluation Note  Leslie [name] , a 56 y.o. female  was evaluated in triage.  Pt complains of shortness of breath x 3 days.  Has associated rhinorrhea, nasal congestion, cough.  Has tried over-the-counter Mucinex for his symptoms.  Denies chest pain.  Has a history of asthma and had to use an inhaler.  Review of Systems  Positive:  Negative:   Physical Exam  BP (!) 181/94 (BP Location: Right Arm)   Pulse 65   Temp 97.9 F (36.6 C) (Oral)   Resp 16   Ht '5\' 4"'$  (1.626 m)   Wt 107 kg   SpO2 95%   BMI 40.49 kg/m  Gen:   Awake, no distress   Resp:  Normal effort  MSK:   Moves extremities without difficulty  Other:    Medical Decision Making  Medically screening exam initiated at 11:13 AM.  Appropriate orders placed.  Leslie [name] was informed that the remainder of the evaluation will be completed by another provider, this initial triage assessment does not replace that evaluation, and the importance of remaining in the ED until their evaluation is complete.     Leslie [name] A, PA-C 01/12/22 1113

## 2022-01-12 NOTE — ED Triage Notes (Signed)
Pt endorses cough, SOB, dizzy and dehydration for 3-4 days. Denies CP.

## 2022-01-12 NOTE — Discharge Instructions (Addendum)
Your workup today showed you have flu.  I have sent Tamiflu into the pharmacy for you.  Continue to drink plenty of fluids.  Your kidney function was slightly elevated today at 2.32 indicating some component of dehydration.  I recommend you increase your fluids primarily water.  Avoid caffeinated beverages.  Recommend you follow-up with your primary care provider or your cardiologist to have your kidney function rechecked within 72 hours to ensure that it is not worsening.  For your symptoms you can do a sinus rinse for the sinus congestion, warm salt water gargles for the sore throat, cough drops for cough.  For any concerning symptoms return to the emergency room.

## 2022-01-12 NOTE — ED Provider Notes (Signed)
[facility] EMERGENCY DEPARTMENT Provider Note   CSN: [id number] Arrival date & time: 01/12/22  1059     History  Chief Complaint  Patient presents with   Cough   Shortness of Breath    Leslie [name] is a 56 y.o. female.  56 year old female presents today for 3-day evaluation of congestion, cough, postnasal drip.  She also endorses chills but denies fevers.  Denies chest pain, shortness of breath, abdominal pain, nausea or vomiting.  She has been around her grandchildren who have been sick with viral upper respiratory infections.  She denies peripheral edema or other signs of CHF exacerbation.  The history is provided by the patient. No language interpreter was used.       Home Medications Prior to Admission medications   Medication Sig Start Date End Date Taking? Authorizing Provider  albuterol (VENTOLIN HFA) 108 (90 Base) MCG/ACT inhaler INHALE 2 PUFFS INTO THE LUNGS EVERY 6 HOURS AS NEEDED FOR WHEEZING OR SHORTNESS OF BREATH 11/27/21   McDonough, Si Gaul, PA-C  ALPRAZolam Duanne Moron) 0.5 MG tablet TAKE 1/2 TABLET BY MOUTH AT BEDTIME AS NEEDED FOR ANXIETY 12/29/21   McDonough, Lauren K, PA-C  amLODipine (NORVASC) 10 MG tablet TAKE 1 TABLET(10 MG) BY MOUTH DAILY. Patient taking differently: Take 10 mg by mouth daily. 08/10/21   Minna Merritts, MD  amoxicillin-clavulanate (AUGMENTIN) 875-125 MG tablet Take 1 tablet by mouth 2 (two) times daily. 12/16/21   Jonetta Osgood, NP  aspirin 81 MG EC tablet Take 81 mg by mouth daily.    [provider]  atorvastatin (LIPITOR) 80 MG tablet Take 1 tablet (80 mg total) by mouth daily. 11/30/21   Kate Sable, MD  calcium carbonate (TUMS - DOSED IN MG ELEMENTAL CALCIUM) 500 MG chewable tablet Chew 500 mg by mouth daily as needed for indigestion or heartburn.    [provider]  chlorpheniramine-HYDROcodone (TUSSIONEX) 10-8 MG/5ML Take 5 mLs by mouth every 12 (twelve) hours as needed. 12/16/21    Jonetta Osgood, NP  FEROCON capsule TAKE ONE CAPSULE BY MOUTH TWICE DAILY AFTER MEALS Patient taking differently: Take 1 capsule by mouth 2 (two) times daily after a meal. 02/17/21   Abernathy, Yetta Flock, NP  metoprolol succinate (TOPROL-XL) 100 MG 24 hr tablet TAKE 1 TABLET(100 MG) BY MOUTH TWICE DAILY WITH OR IMMEDIATELY FOLLOWING A MEAL Patient taking differently: Take 100 mg by mouth every evening. 09/17/21   Theora Gianotti, NP  omeprazole (PRILOSEC) 40 MG capsule TAKE ONE CAPSULE BY MOUTH DAILY 08/09/21   McDonough, Lauren K, PA-C  ondansetron (ZOFRAN-ODT) 4 MG disintegrating tablet Take 1 tablet (4 mg total) by mouth every 8 (eight) hours as needed for nausea or vomiting. 10/29/21   Duffy Bruce, MD  potassium chloride SA (KLOR-CON M) 20 MEQ tablet Take 4 tabs (80 MEQ) every AM, and take 3 tabs (60 MEQ) every PM. 12/01/21   Agbor-Etang, Aaron Edelman, MD  predniSONE (DELTASONE) 10 MG tablet Take one tab 3 x day for 3 days, then take one tab 2 x a day for 3 days and then take one tab a day for 3 days for copd 12/16/21   Jonetta Osgood, NP  spironolactone (ALDACTONE) 25 MG tablet Take 1 tablet (25 mg total) by mouth daily. 12/08/21 07/07/22  Kate Sable, MD  sucralfate (CARAFATE) 1 g tablet Take 1 tablet (1 g total) by mouth 4 (four) times daily -  with meals and at bedtime. 12/01/21 04/08/22  Kate Sable, MD  torsemide (DEMADEX) 20  MG tablet Take 1 tablet (20 mg total) by mouth 2 (two) times daily. 11/16/21   Nita Sells, MD  warfarin (COUMADIN) 2 MG tablet TAKE 1/2 TO 1 TABLET BY MOUTH DAILY AS DIRECTED BY THE [facility]TI-[facility]AG [facility]IN[facility] Patient taking differently: Take 1-2 mg by mouth See admin instructions. Take 2 mg every day every day except on Sunday Thursday take 1 mg per patient 11/02/21   End, Harrell Gave, MD      Allergies    Morphine, Oxycodone hcl, and Dilaudid [hydromorphone hcl]    Review of Systems   Review of Systems  Constitutional:  Positive for chills.  Negative for fever.  HENT:  Positive for congestion and postnasal drip. Negative for sore throat.   Respiratory:  Positive for cough. Negative for shortness of breath.   Cardiovascular:  Negative for chest pain and leg swelling.  Gastrointestinal:  Negative for abdominal pain, nausea and vomiting.  All other systems reviewed and are negative.   Physical Exam Updated Vital Signs BP (!) 181/94 (BP Location: Right Arm)   Pulse 65   Temp 97.9 F (36.6 C) (Oral)   Resp 16   Ht _0  (1.626 m)   Wt 107 kg   SpO2 95%   BMI 40.49 kg/m  Physical Exam Vitals and nursing note reviewed.  Constitutional:      General: She is not in acute distress.    Appearance: Normal appearance. She is not ill-appearing.  HENT:     Head: Normocephalic and atraumatic.     Nose: Nose normal.  Eyes:     General: No scleral icterus.    Extraocular Movements: Extraocular movements intact.     Conjunctiva/sclera: Conjunctivae normal.  Cardiovascular:     Rate and Rhythm: Normal rate and regular rhythm.     Pulses: Normal pulses.  Pulmonary:     Effort: Pulmonary effort is normal. No respiratory distress.     Breath sounds: Normal breath sounds. No wheezing or rales.  Abdominal:     General: There is no distension.     Tenderness: There is no abdominal tenderness.  Musculoskeletal:        General: Normal range of motion.     Cervical back: Normal range of motion.     Right lower leg: No edema.     Left lower leg: No edema.  Skin:    General: Skin is warm and dry.  Neurological:     General: No focal deficit present.     Mental Status: She is alert. Mental status is at baseline.     ED Results / Procedures / Treatments   Labs (all labs ordered are listed, but only abnormal results are displayed) Labs Reviewed  RESP PANEL BY RT-PCR (RSV, FLU A&B, COVID)  RVPGX2 - Abnormal; Notable for the following components:      Result Value   Influenza A by PCR POSITIVE (*)    All other components within  normal limits  BASIC METABOLIC PANEL - Abnormal; Notable for the following components:   BUN 43 (*)    Creatinine, Ser 2.32 (*)    GFR, Estimated 24 (*)    All other components within normal limits  CBC WITH DIFFERENTIAL/PLATELET - Abnormal; Notable for the following components:   Hemoglobin 11.8 (*)    HCT 35.7 (*)    All other components within normal limits  BRAIN NATRIURETIC PEPTIDE    EKG None  Radiology DG Chest 2 View  Result Date: 01/12/2022 CLINICAL DATA:  57 year old female with  shortness of breath. Atrial fibrillation. Prior cardiac surgery. Former smoker. EXAM: CHEST - 2 VIEW COMPARISON:  Chest radiographs 10/29/2021 and earlier. FINDINGS: Normal cardiac size and mediastinal contours. Chronic prosthetic cardiac valve and postoperative changes to the left atrial appendage. Stable lung volumes from last year. Visualized tracheal air column is within normal limits. No pneumothorax, pleural effusion, or consolidation. Symmetric increased pulmonary interstitial markings similar from multiple prior exams, although increased today compared to May of 2022. No pleural fluid in the fissures. No confluent opacity. No acute osseous abnormality identified. Abdominal Calcified aortic atherosclerosis. Stable cholecystectomy clips. Negative visible bowel gas. IMPRESSION: 1. Similar increased pulmonary interstitial markings to prior exams. Difficult to exclude mild/developing interstitial edema, but more likely this may be sequelae of smoking. 2. Prosthetic cardiac valve with no cardiomegaly. And no other acute cardiopulmonary abnormality. Electronically Signed   By: Genevie Ann M.D.   On: 01/12/2022 12:02    Procedures Procedures    Medications Ordered in ED Medications - No data to display  ED Course/ Medical Decision Making/ A&P                           Medical Decision Making  Medical Decision Making / ED Course   This patient presents to the ED for concern of URI symptoms, this  involves an extensive number of treatment options, and is a complaint that carries with it a high risk of complications and morbidity.  The differential diagnosis includes COVID, flu, other viral URI, pneumonia, CHF exacerbation  MDM: 56 year old female presents with above-mentioned complaints.  Overall she is well-appearing.  Flu positive.  BNP within normal limits.  CBC without leukocytosis, hemoglobin around baseline.  Otherwise unremarkable.  BMP with creatinine of 2.32 which is slightly above patient's baseline otherwise unremarkable.  BUN elevated at 43 indicating dehydration.  She does have history of CHF with preserved EF.  Shared decision making had regarding gentle IV hydration versus increasing p.o. fluids.  Patient prefers increasing p.o. hydration at home.  We discussed close follow-up with her primary care provider or her cardiologist to have repeat renal function panel within 72 hours.  She is in agreement with this plan.  Will prescribe Tamiflu.  Symptomatic management discussed.  Patient voices understanding and is in agreement with plan. No signs of volume bloat.  Low suspicion for CHF exacerbation.  Without anginal symptoms.  EKG without acute ischemic changes.  Low suspicion for ACS.   Lab Tests: -I ordered, reviewed, and interpreted labs.   The pertinent results include:   Labs Reviewed  RESP PANEL BY RT-PCR (RSV, FLU A&B, COVID)  RVPGX2 - Abnormal; Notable for the following components:      Result Value   Influenza A by PCR POSITIVE (*)    All other components within normal limits  BASIC METABOLIC PANEL - Abnormal; Notable for the following components:   BUN 43 (*)    Creatinine, Ser 2.32 (*)    GFR, Estimated 24 (*)    All other components within normal limits  CBC WITH DIFFERENTIAL/PLATELET - Abnormal; Notable for the following components:   Hemoglobin 11.8 (*)    HCT 35.7 (*)    All other components within normal limits  BRAIN NATRIURETIC PEPTIDE      EKG  EKG  Interpretation  Date/Time:    Ventricular Rate:    PR Interval:    QRS Duration:   QT Interval:    QTC Calculation:   R Axis:  Text Interpretation:           Imaging Studies ordered: I ordered imaging studies including CXR I independently visualized and interpreted imaging. I agree with the radiologist interpretation   Medicines ordered and prescription drug management: No orders of the defined types were placed in this encounter.   -I have reviewed the patients home medicines and have made adjustments as needed  Reevaluation: After the interventions noted above, I reevaluated the patient and found that they have :stayed the same  Co morbidities that complicate the patient evaluation  Past Medical History:  Diagnosis Date   (HFpEF) heart failure with preserved ejection fraction ([place])    a. 08/2017 Echo: EF 55-60%.  Grade 2 diastolic dysfunction; b. 01/32 Echo: EF 50-55%, no rwma, Nl RV fxn, nl fxn'ing mech MV.   Allergy    Anemia    Anxiety    Asthma    BRCA negative 03/22/2013   Bronchitis 02/19/2016   ON LEVAQUIN PO   Chest tightness    a. 08/2019 Cath: nl cors.   Cigarette smoker 09/11/2017   8-10 day   Dysrhythmia    GERD (gastroesophageal reflux disease)    History of kidney stones    Hyperlipidemia    Hypertension    Interstitial lung disease ([place])    a. CT 2013 b. 02/2018 CXR noted recurrent intersistial changes ILD vs chronic bronchitis   Moderate mitral stenosis    a.  08/2017 TEE: EF 60 to 65%.  Moderate mitral stenosis.  Mean gradient 14 mmHg.  Valve area 2.59 cm by planimetry, 2.72 cm by pressure half-time.   PAF (paroxysmal atrial fibrillation) ([place])    a. 08/2017 s/p TEE/DCCV; b. CHA2DS2VASc = 2-->warfarin.   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 04/2020 s/p 27/29 mm Onyx mech mitral valve-->chronic coumadin; b. 05/2020 Echo: Nl fxn'ing mech MV.      Dispostion: Patient is appropriate for discharge.  Discharged in stable condition.  Return precaution discussed.  Patient voices understanding and is in agreement with plan.   Final Clinical Impression(s) / ED Diagnoses Final diagnoses:  Influenza    Rx / DC Orders ED Discharge Orders          Ordered    oseltamivir (TAMIFLU) 75 MG capsule  Daily        01/12/22 1455              Evlyn Courier, PA-C 01/12/22 1457    Elgie Congo, MD 01/12/22 1544

## 2022-01-15 ENCOUNTER — Other Ambulatory Visit
Admission: RE | Admit: 2022-01-15 | Discharge: 2022-01-15 | Disposition: A | Discharge status: Home / Self Care | Payer: BLUE CROSS/BLUE SHIELD | Source: Ambulatory Visit | Attending: Cardiology | Admitting: Cardiology

## 2022-01-15 DIAGNOSIS — D649 Anemia, unspecified: Secondary | ICD-10-CM | POA: Insufficient documentation

## 2022-01-15 DIAGNOSIS — I5032 Chronic diastolic (congestive) heart failure: Secondary | ICD-10-CM | POA: Diagnosis present

## 2022-01-15 LAB — BASIC METABOLIC PANEL
Anion gap: 7 (ref 5–15)
BUN: 35 mg/dL — ABNORMAL HIGH (ref 6–20)
CO2: 27 mmol/L (ref 22–32)
Calcium: 9.2 mg/dL (ref 8.9–10.3)
Chloride: 104 mmol/L (ref 98–111)
Creatinine, Ser: 2.07 mg/dL — ABNORMAL HIGH (ref 0.44–1.00)
GFR, Estimated: 28 mL/min — ABNORMAL LOW (ref >60)
Glucose, Bld: 106 mg/dL — ABNORMAL HIGH (ref 70–99)
Potassium: 4.4 mmol/L (ref 3.5–5.1)
Sodium: 138 mmol/L (ref 135–145)

## 2022-01-15 LAB — IRON AND TIBC
Iron: 90 ug/dL (ref 28–170)
Saturation Ratios: 28 % (ref 10.4–31.8)
TIBC: 325 ug/dL (ref 250–450)
UIBC: 235 ug/dL

## 2022-01-31 ENCOUNTER — Other Ambulatory Visit: Payer: Self-pay | Admitting: Cardiovascular Disease

## 2022-01-31 DIAGNOSIS — I5032 Chronic diastolic (congestive) heart failure: Secondary | ICD-10-CM

## 2022-01-31 DIAGNOSIS — Z954 Presence of other heart-valve replacement: Secondary | ICD-10-CM

## 2022-01-31 DIAGNOSIS — I050 Rheumatic mitral stenosis: Secondary | ICD-10-CM

## 2022-01-31 DIAGNOSIS — I480 Paroxysmal atrial fibrillation: Secondary | ICD-10-CM

## 2022-02-03 ENCOUNTER — Ambulatory Visit: Payer: BLUE CROSS/BLUE SHIELD

## 2022-02-04 ENCOUNTER — Telehealth: Payer: Self-pay

## 2022-02-04 ENCOUNTER — Telehealth: Payer: Medicaid Other | Admitting: Nurse Practitioner

## 2022-02-04 ENCOUNTER — Other Ambulatory Visit: Payer: Self-pay

## 2022-02-04 MED ORDER — DOXYCYCLINE HYCLATE 100 MG PO TABS: 100.0000 mg | ORAL_TABLET | Freq: Two times a day (BID) | ORAL | 0 refills | Status: DC

## 2022-02-04 NOTE — Telephone Encounter (Signed)
Pt called that is coughing,congestion ,headache and covid test is negative as per lauren advised her that we send doxycycline make sure she have her coumadin test she said she had appt next week

## 2022-02-08 ENCOUNTER — Ambulatory Visit: Payer: Medicaid Other | Admitting: Physician Assistant

## 2022-02-08 ENCOUNTER — Encounter: Payer: Self-pay | Admitting: Internal Medicine

## 2022-02-08 ENCOUNTER — Encounter: Payer: Self-pay | Admitting: Physician Assistant

## 2022-02-08 VITALS — BP 130/68 | HR 62 | Temp 98.4°F | Resp 16 | Ht 64.0 in | Wt 240.6 lb

## 2022-02-08 DIAGNOSIS — E1165 Type 2 diabetes mellitus with hyperglycemia: Secondary | ICD-10-CM

## 2022-02-08 DIAGNOSIS — N183 Chronic kidney disease, stage 3 unspecified: Secondary | ICD-10-CM

## 2022-02-08 DIAGNOSIS — I5032 Chronic diastolic (congestive) heart failure: Secondary | ICD-10-CM

## 2022-02-08 LAB — POCT GLYCOSYLATED HEMOGLOBIN (HGB A1C): Hemoglobin A1C: 5.8 % — AB (ref 4.0–5.6)

## 2022-02-08 MED ORDER — FLUCONAZOLE 150 MG PO TABS: 150.0000 mg | ORAL_TABLET | Freq: Once | ORAL | 0 refills | Status: AC

## 2022-02-08 MED ORDER — TIRZEPATIDE 2.5 MG/0.5ML ~~LOC~~ SOAJ: 2.5000 mg | SUBCUTANEOUS | 2 refills | Status: DC

## 2022-02-08 NOTE — Progress Notes (Signed)
[facility] [place], [place] [address]  Internal MEDICINE  Office Visit Note  Patient Name: Leslie [name]  [id number]  [id number]  Date of Service: 02/08/2022  Chief Complaint  Patient presents with   Follow-up   Gastroesophageal Reflux   Hypertension   Hyperlipidemia   Quality Metric Gaps    TDAP    HPI Pt is here for routine follow up -Seeing cardiology Wednesday -Will follow up with nephrology as well -ferocon not available currently, was given after surgery. Labs recently have been stable -decreased to 1 tab torsemide and spironolactone on her own and will discuss with cardiology -Needs diflucan with ABX and aware of risks/impact on INR and QT. She has taken diflucan before. -Really interested in diabetes medication and wt loss. Discussed trying GLP1 such as mounjaro, but made aware of possible side effects. Will avoid SGLT2 for now due to renal function and since she is prone to yeast infections already. -Congestion improving with ABX -plans to start back exercise routine and is going to discuss with cardiology as well, but advised to start slow  Current Medication: Outpatient Encounter Medications as of 02/08/2022  Medication Sig Note   albuterol (VENTOLIN HFA) 108 (90 Base) MCG/ACT inhaler INHALE 2 PUFFS INTO THE LUNGS EVERY 6 HOURS AS NEEDED FOR WHEEZING OR SHORTNESS OF BREATH    ALPRAZolam (XANAX) 0.5 MG tablet TAKE 1/2 TABLET BY MOUTH AT BEDTIME AS NEEDED FOR ANXIETY    amLODipine (NORVASC) 10 MG tablet TAKE 1 TABLET(10 MG) BY MOUTH DAILY    aspirin 81 MG EC tablet Take 81 mg by mouth daily.    atorvastatin (LIPITOR) 80 MG tablet Take 1 tablet (80 mg total) by mouth daily.    calcium carbonate (TUMS - DOSED IN MG ELEMENTAL CALCIUM) 500 MG chewable tablet Chew 500 mg by mouth daily as needed for indigestion or heartburn.    chlorpheniramine-HYDROcodone (TUSSIONEX) 10-8 MG/5ML Take 5 mLs by mouth every 12 (twelve) hours as needed.     doxycycline (VIBRA-TABS) 100 MG tablet Take 1 tablet (100 mg total) by mouth 2 (two) times daily.    FEROCON capsule TAKE ONE CAPSULE BY MOUTH TWICE DAILY AFTER MEALS (Patient taking differently: Take 1 capsule by mouth 2 (two) times daily after a meal.)    fluconazole (DIFLUCAN) 150 MG tablet Take 1 tablet (150 mg total) by mouth once for 1 dose.    metoprolol succinate (TOPROL-XL) 100 MG 24 hr tablet TAKE 1 TABLET(100 MG) BY MOUTH TWICE DAILY WITH OR IMMEDIATELY FOLLOWING A MEAL (Patient taking differently: Take 100 mg by mouth every evening.) 11/11/2021: Patient states she only takes once daily    omeprazole (PRILOSEC) 40 MG capsule TAKE ONE CAPSULE BY MOUTH DAILY    potassium chloride SA (KLOR-CON M) 20 MEQ tablet Take 4 tabs (80 MEQ) every AM, and take 3 tabs (60 MEQ) every PM.    spironolactone (ALDACTONE) 25 MG tablet Take 1 tablet (25 mg total) by mouth daily.    sucralfate (CARAFATE) 1 g tablet Take 1 tablet (1 g total) by mouth 4 (four) times daily -  with meals and at bedtime.    tirzepatide [facility]) 2.5 MG/0.5ML Pen Inject 2.5 mg into the skin once a week.    torsemide (DEMADEX) 20 MG tablet Take 1 tablet (20 mg total) by mouth 2 (two) times daily.    warfarin (COUMADIN) 2 MG tablet TAKE 1/2 TO 1 TABLET BY MOUTH DAILY AS DIRECTED BY THE ANTI-COAG CLINIC (Patient taking differently:  Take 1-2 mg by mouth See admin instructions. Take 2 mg every day every day except on Sunday Thursday take 1 mg per patient)    [DISCONTINUED] ondansetron (ZOFRAN-ODT) 4 MG disintegrating tablet Take 1 tablet (4 mg total) by mouth every 8 (eight) hours as needed for nausea or vomiting.    [DISCONTINUED] predniSONE (DELTASONE) 10 MG tablet Take one tab 3 x day for 3 days, then take one tab 2 x a day for 3 days and then take one tab a day for 3 days for copd    No facility-administered encounter medications on file as of 02/08/2022.    Surgical History: Past Surgical History:  Procedure Laterality Date    ABDOMINAL HYSTERECTOMY     total   ABDOMINAL HYSTERECTOMY     BREAST BIOPSY Bilateral 2012   BREAST BIOPSY Right 08-07-12   fibroadenomatous changes and columnar cells   BREAST BIOPSY  02/03/2015   stereo byrnett   BUBBLE STUDY  04/01/2020   Procedure: BUBBLE STUDY;  Surgeon: [name], MD;  Location: [place];  Service: Cardiovascular;;   CARDIAC CATHETERIZATION     CHOLECYSTECTOMY N/A 02/27/2016   Procedure: LAPAROSCOPIC CHOLECYSTECTOMY;  Surgeon: Christene Lye, MD;  Location: [facility] ORS;  Service: General;  Laterality: N/A;   CLIPPING OF ATRIAL APPENDAGE  04/29/2020   Procedure: CLIPPING OF ATRIAL APPENDAGE USING ATRICURE  PRO2 CLIP SIZE 45MM;  Surgeon: Rexene Alberts, MD;  Location: [facility] OR;  Service: Open Heart Surgery;;   COLONOSCOPY WITH PROPOFOL N/A 09/26/2018   Procedure: COLONOSCOPY WITH PROPOFOL;  Surgeon: Lucilla Lame, MD;  Location: [facility] ENDOSCOPY;  Service: Endoscopy;  Laterality: N/A;   COLONOSCOPY WITH PROPOFOL N/A 07/16/2021   Procedure: COLONOSCOPY WITH PROPOFOL;  Surgeon: Lucilla Lame, MD;  Location: [facility] ENDOSCOPY;  Service: Endoscopy;  Laterality: N/A;   CYSTOSCOPY W/ RETROGRADES Right 08/18/2018   Procedure: CYSTOSCOPY WITH RETROGRADE PYELOGRAM;  Surgeon: Billey Co, MD;  Location: [facility] ORS;  Service: Urology;  Laterality: Right;   CYSTOSCOPY/URETEROSCOPY/HOLMIUM LASER/STENT PLACEMENT Right 08/18/2018   Procedure: CYSTOSCOPY/URETEROSCOPY/STENT PLACEMENT;  Surgeon: Billey Co, MD;  Location: [facility] ORS;  Service: Urology;  Laterality: Right;   DIAGNOSTIC LAPAROSCOPY     ESOPHAGOGASTRODUODENOSCOPY N/A 07/16/2021   Procedure: ESOPHAGOGASTRODUODENOSCOPY (EGD);  Surgeon: Lucilla Lame, MD;  Location: [facility] ENDOSCOPY;  Service: Endoscopy;  Laterality: N/A;   ESOPHAGOGASTRODUODENOSCOPY (EGD) WITH PROPOFOL N/A 09/26/2018   Procedure: ESOPHAGOGASTRODUODENOSCOPY (EGD) WITH PROPOFOL;  Surgeon: Lucilla Lame, MD;  Location: [facility] ENDOSCOPY;  Service: Endoscopy;   Laterality: N/A;   GIVENS CAPSULE STUDY N/A 11/03/2018   Procedure: GIVENS CAPSULE STUDY;  Surgeon: Lucilla Lame, MD;  Location: [facility] ENDOSCOPY;  Service: Endoscopy;  Laterality: N/A;   JOINT REPLACEMENT Left    knee   KNEE ARTHROPLASTY Right 04/09/2019   Procedure: COMPUTER ASSISTED TOTAL KNEE ARTHROPLASTY;  Surgeon: Dereck Leep, MD;  Location: [facility] ORS;  Service: Orthopedics;  Laterality: Right;   KNEE CLOSED REDUCTION Left 04/15/2015   Procedure: CLOSED MANIPULATION KNEE;  Surgeon: Thornton Park, MD;  Location: [facility] ORS;  Service: Orthopedics;  Laterality: Left;   KNEE SURGERY     MAZE N/A 04/29/2020   Procedure: MAZE;  Surgeon: Rexene Alberts, MD;  Location: [place];  Service: Open Heart Surgery;  Laterality: N/A;   MITRAL VALVE REPLACEMENT N/A 04/29/2020   Procedure: MITRAL VALVE (MV) REPLACEMENT USING ON-X VALVE SIZE 27/29MM;  Surgeon: Rexene Alberts, MD;  Location: [place];  Service: Open Heart Surgery;  Laterality: N/A;   MULTIPLE EXTRACTIONS  WITH ALVEOLOPLASTY N/A 02/28/2020   Procedure: MULTIPLE EXTRACTION WITH ALVEOLOPLASTY;  Surgeon: [name], DMD;  Location: [place];  Service: Dentistry;  Laterality: N/A;   RIGHT/LEFT HEART CATH AND CORONARY ANGIOGRAPHY Bilateral 09/06/2019   Procedure: RIGHT/LEFT HEART CATH AND CORONARY ANGIOGRAPHY;  Surgeon: Minna Merritts, MD;  Location: [place] CV LAB;  Service: Cardiovascular;  Laterality: Bilateral;   TEE WITHOUT CARDIOVERSION N/A 09/13/2017   Procedure: TRANSESOPHAGEAL ECHOCARDIOGRAM (TEE);  Surgeon: Nelva Bush, MD;  Location: [facility] ORS;  Service: Cardiovascular;  Laterality: N/A;   TEE WITHOUT CARDIOVERSION N/A 04/01/2020   Procedure: TRANSESOPHAGEAL ECHOCARDIOGRAM (TEE);  Surgeon: [name], MD;  Location: [facility] ENDOSCOPY;  Service: Cardiovascular;  Laterality: N/A;   TEE WITHOUT CARDIOVERSION N/A 04/29/2020   Procedure: TRANSESOPHAGEAL ECHOCARDIOGRAM (TEE);  Surgeon: Rexene Alberts, MD;  Location: [place];   Service: Open Heart Surgery;  Laterality: N/A;   TOTAL KNEE ARTHROPLASTY Left 12/25/2014   Procedure: TOTAL KNEE ARTHROPLASTY;  Surgeon: Thornton Park, MD;  Location: [facility] ORS;  Service: Orthopedics;  Laterality: Left;   TUBAL LIGATION      Medical History: Past Medical History:  Diagnosis Date   (HFpEF) heart failure with preserved ejection fraction ([place])    a. 08/2017 Echo: EF 55-60%.  Grade 2 diastolic dysfunction; b. 0/1751 Echo: EF 50-55%, no rwma, Nl RV fxn, nl fxn'ing mech MV.   Allergy    Anemia    Anxiety    Asthma    BRCA negative 03/22/2013   Bronchitis 02/19/2016   ON LEVAQUIN PO   Chest tightness    a. 08/2019 Cath: nl cors.   Cigarette smoker 09/11/2017   8-10 day   Dysrhythmia    GERD (gastroesophageal reflux disease)    History of kidney stones    Hyperlipidemia    Hypertension    Interstitial lung disease ([place])    a. CT 2013 b. 02/2018 CXR noted recurrent intersistial changes ILD vs chronic bronchitis   Moderate mitral stenosis    a.  08/2017 TEE: EF 60 to 65%.  Moderate mitral stenosis.  Mean gradient 14 mmHg.  Valve area 2.59 cm by planimetry, 2.72 cm by pressure half-time.   PAF (paroxysmal atrial fibrillation) ([place])    a. 08/2017 s/p TEE/DCCV; b. CHA2DS2VASc = 2-->warfarin.   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 04/2020 s/p 27/29 mm Onyx mech mitral valve-->chronic coumadin; b. 05/2020 Echo: Nl fxn'ing mech MV.    Family History: Family History  Problem Relation Age of Onset   Cancer Mother 61       breast   Hypertension Mother    Cancer Maternal Aunt        breast   Cancer Maternal Grandmother        breast   Osteoarthritis Father    Hypertension Father     Social History   Socioeconomic History   Marital  status: Married    Spouse name: Not on file   Number of children: Not on file   Years of education: Not on file   Highest education level: Not on file  Occupational History   Not on file  Tobacco Use   Smoking status: Former    Packs/day: 0.50    Years: 20.00    Total pack years: 10.00  Types: Cigarettes    Quit date: 01/2016    Years since quitting: 6.0   Smokeless tobacco: Never  Vaping Use   Vaping Use: Never used  Substance and Sexual Activity   Alcohol use: Yes    Comment: rarely   Drug use: No   Sexual activity: Not on file  Other Topics Concern   Not on file  Social History Narrative   Not on file   Social Determinants of Health   Financial Resource Strain: Low Risk  (05/07/2020)   Overall Financial Resource Strain (CARDIA)    Difficulty of Paying Living Expenses: Not very hard  Food Insecurity: Unknown (11/16/2021)   Hunger Vital Sign    Worried About Running Out of Food in the Last Year: Not on file    Ran Out of Food in the Last Year: Never true  Transportation Needs: No Transportation Needs (11/16/2021)   PRAPARE - Hydrologist (Medical): No    Lack of Transportation (Non-Medical): No  Physical Activity: Not on file  Stress: Not on file  Social Connections: Not on file  Intimate Partner Violence: Not At Risk (11/16/2021)   Humiliation, Afraid, Rape, and Kick questionnaire    Fear of Current or Ex-Partner: No    Emotionally Abused: No    Physically Abused: No    Sexually Abused: No      Review of Systems  Constitutional:  Negative for chills, fatigue and unexpected weight change.  HENT:  Positive for congestion and postnasal drip. Negative for rhinorrhea, sneezing and sore throat.   Eyes:  Negative for redness.  Respiratory:  Negative for cough, chest tightness and shortness of breath.   Cardiovascular:  Negative for chest pain and palpitations.  Gastrointestinal:  Negative for abdominal pain, constipation, diarrhea,  nausea and vomiting.  Genitourinary:  Negative for dysuria and frequency.  Musculoskeletal:  Negative for arthralgias, back pain, joint swelling and neck pain.  Skin:  Negative for rash.  Neurological: Negative.  Negative for tremors and numbness.  Hematological:  Negative for adenopathy. Does not bruise/bleed easily.  Psychiatric/Behavioral:  Negative for behavioral problems (Depression), sleep disturbance and suicidal ideas. The patient is not nervous/anxious.     Vital Signs: BP 130/68 Comment: 141/60  Pulse 62   Temp 98.4 F (36.9 C)   Resp 16   Ht '5\' 4"'$  (1.626 m)   Wt 240 lb 9.6 oz (109.1 kg)   SpO2 99%   BMI 41.30 kg/m    Physical Exam Vitals and nursing note reviewed.  Constitutional:      General: She is not in acute distress.    Appearance: Normal appearance. She is well-developed. She is obese. She is not diaphoretic.  HENT:     Head: Normocephalic and atraumatic.     Mouth/Throat:     Pharynx: No oropharyngeal exudate.  Eyes:     Pupils: Pupils are equal, round, and reactive to light.  Neck:     Thyroid: No thyromegaly.     Vascular: No JVD.     Trachea: No tracheal deviation.  Cardiovascular:     Rate and Rhythm: Normal rate and regular rhythm.     Heart sounds: Normal heart sounds. No murmur heard.    No friction rub. No gallop.  Pulmonary:     Effort: Pulmonary effort is normal. No respiratory distress.     Breath sounds: No wheezing or rales.  Chest:     Chest wall: No tenderness.  Abdominal:  General: Bowel sounds are normal.     Palpations: Abdomen is soft.  Musculoskeletal:        General: Normal range of motion.     Cervical back: Normal range of motion and neck supple.  Lymphadenopathy:     Cervical: No cervical adenopathy.  Skin:    General: Skin is warm and dry.  Neurological:     Mental Status: She is alert and oriented to person, place, and time.     Cranial Nerves: No cranial nerve deficit.  Psychiatric:        Behavior: Behavior  normal.        Thought Content: Thought content normal.        Judgment: Judgment normal.        Assessment/Plan: 1. Type 2 diabetes mellitus with hyperglycemia, without long-term current use of insulin (HCC) - POCT HgB A1C is 5.8 which is improved from 6.5 last check. Will start mounjaro and titrate as indicated, but monitor for any S/E - Urine Microalbumin w/creat. ratio - tirzepatide (MOUNJARO) 2.5 MG/0.5ML Pen; Inject 2.5 mg into the skin once a week.  Dispense: 2 mL; Refill: 2  2. Chronic heart failure with preserved ejection fraction ([place]) Followed by cardiology  3. Stage 3 chronic kidney disease, unspecified whether stage 3a or 3b CKD ([place]) Followed by nephrology  4. Morbid obesity with body mass index (BMI) of 40.0 or higher (HCC) Will start back on exercise routine slowly and discuss with cardiology at appt this week. Will also start mounjaro to help BG and wt loss goals and will work on diet as well.   General Counseling: Briann verbalizes understanding of the findings of todays visit and agrees with plan of treatment. I have discussed any further diagnostic evaluation that may be needed or ordered today. We also reviewed her medications today. she has been encouraged to call the office with any questions or concerns that should arise related to todays visit.    Orders Placed This Encounter  Procedures   Urine Microalbumin w/creat. ratio   POCT HgB A1C    Meds ordered this encounter  Medications   tirzepatide (MOUNJARO) 2.5 MG/0.5ML Pen    Sig: Inject 2.5 mg into the skin once a week.    Dispense:  2 mL    Refill:  2   fluconazole (DIFLUCAN) 150 MG tablet    Sig: Take 1 tablet (150 mg total) by mouth once for 1 dose.    Dispense:  1 tablet    Refill:  0    This patient was seen by Drema Dallas, PA-C in collaboration with Dr. Clayborn Bigness as a part of collaborative care agreement.   Total time spent:30 Minutes Time spent includes review of chart,  medications, test results, and follow up plan with the patient.      Dr Lavera Guise Internal medicine

## 2022-02-09 LAB — MICROALBUMIN / CREATININE URINE RATIO
Creatinine, Urine: 60.8 mg/dL
Microalb/Creat Ratio: 25 mg/g creat (ref 0–29)
Microalbumin, Urine: 15.2 ug/mL

## 2022-02-09 NOTE — Progress Notes (Unsigned)
Date:  02/10/2022   ID:  Leslie [name], DOB Nov 09, 1965, MRN [id number]   PCP:  [name], PA-C  Cardiologist:  Ida Rogue, MD  Electrophysiologist:  None   Evaluation Performed:  Follow-Up Visit   Chief Complaint  Patient presents with   Follow-up    Patient reports cramping in her arms and less as well as SOB.    History of Present Illness:    Leslie [name] is a 57 y.o. female with  mitral valve stenosis morbid obesity,  Snores, possible OSA anxiety,  hyperlipidemia,  Hospital admission 09/11/2017 for  shortness of breath chest tightness in atrial fibrillation with RVR Had TEE  , Cardioversion Mild COPD, long hx of smoking Cardiac CTA Coronary calcium score of 10. Who presents for follow up of her paroxysmal atrial fibrillation, mitral valve stenosis, s/p mechanical MVR, PAF s/p maze,  Last seen in clinic by myself March 2023 04/29/2020 she underwent median sternotomy, mitral valve replacement, MAZE.   Coumadin was initiated due to mechanical MVR  Echo July 2023 Normal EF, stable mechanical mitral valve Right heart pressures estimated 42, ,  mitral valve mean gradient 4 mmHg  Flu 12/23, treated with tamiflu And follow-up she reports that she is sick again, in bed 4 days with viral symptoms Reports having cramping, entire body: comes and goes  H/A, started on on ABX for sinus infection, doxycycline, started in the past day or so  Taking mucinex and tylenol  Out on disability for her cardiac issues Tries to stay busy helping others  Reports having some shortness of breath, reports compliance with her torsemide and spironolactone Notes from primary care indicating taking torsemide once a day not twice a day  Weight up 5 pounds in the past 2 months  Previously placed on iron pill by prescription, reports no pharmacies are stocking this Ferocon  EKG personally reviewed by myself on todays visit Sinus bradycardia rate 59 bpm no significant  ST-T wave changes  Past medical history reviewed  left upper extremity ulnar nerve surgery done on 8/15 by Dr. Gavin Pound complicated by postop bleeding  Dressing change in office on 8/19 showed bleeding so she was sent to ED for ongoing surgical site bleeding Hemoglobin dropped to 7.7 S/p 1 unit PRBC  heparin bridge while in the hospital  Underwent repeat surgery for hematoma in September 2022, off warfarin, had Lovenox bridge Reports still has some numbness in her hand, has not started PT yet  transesophageal echocardiogram 04/01/2020-confirming rheumatic heart disease with severe mitral stenosis.    On trosemide 40/20 daily (3 a day)  Echo 05/2020 Close to normal cardiac function Prosthetic mitral valve working well  Echocardiogram August 2020  mitral valve Moderate thickening of the mitral valve leaflet. Moderate-severe mitral valve stenosis, with mean gradient of 16 mmHg and MVA by continuity equation of 1.0 cm^2.    Transesophageal echo August 2019 with moderate mitral valve stenosis mean gradient of 14 Mobility of the posterior leaflet was restricted.    summer 2020 developed right flank pain,   underwent right ureteroscopy, laser lithotripsy, and stent placement on 08/18/2018.  Had pyelonephritis post procedure  Creatinine trended up  to 4.1  acute kidney injury and sepsis in the setting of recent right ureteral stone status post lithotripsy and likely pyelonephritis.   atrial fibrillation December 2019 01/05/2018 awoke suddenly with tachypalpitations  rates in the 130s   EMS was called, noted to be in sinus rhythm with heart rates  in the 80s to 90s bpm.     Cardiac CTA 1. Coronary calcium score of 10. This was 59 percentile for age and sex matched control.. Normal coronary origin with right dominance.Mild non-obstructive CAD.  Past Medical History:  Diagnosis Date   (HFpEF) heart failure with preserved ejection fraction ([place])    a. 08/2017 Echo: EF 55-60%.  Grade 2  diastolic dysfunction; b. 02/7739 Echo: EF 50-55%, no rwma, Nl RV fxn, nl fxn'ing mech MV.   Allergy    Anemia    Anxiety    Asthma    BRCA negative 03/22/2013   Bronchitis 02/19/2016   ON LEVAQUIN PO   Chest tightness    a. 08/2019 Cath: nl cors.   Cigarette smoker 09/11/2017   8-10 day   Dysrhythmia    GERD (gastroesophageal reflux disease)    History of kidney stones    Hyperlipidemia    Hypertension    Interstitial lung disease ([place])    a. CT 2013 b. 02/2018 CXR noted recurrent intersistial changes ILD vs chronic bronchitis   Moderate mitral stenosis    a.  08/2017 TEE: EF 60 to 65%.  Moderate mitral stenosis.  Mean gradient 14 mmHg.  Valve area 2.59 cm by planimetry, 2.72 cm by pressure half-time.   PAF (paroxysmal atrial fibrillation) ([place])    a. 08/2017 s/p TEE/DCCV; b. CHA2DS2VASc = 2-->warfarin.   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 04/2020 s/p 27/29 mm Onyx mech mitral valve-->chronic coumadin; b. 05/2020 Echo: Nl fxn'ing mech MV.   Past Surgical History:  Procedure Laterality Date   ABDOMINAL HYSTERECTOMY     total   ABDOMINAL HYSTERECTOMY     BREAST BIOPSY Bilateral 2012   BREAST BIOPSY Right 08-07-12   fibroadenomatous changes and columnar cells   BREAST BIOPSY  02/03/2015   stereo byrnett   BUBBLE STUDY  04/01/2020   Procedure: BUBBLE STUDY;  Surgeon: [name], MD;  Location: [place];  Service: Cardiovascular;;   CARDIAC CATHETERIZATION     CHOLECYSTECTOMY N/A 02/27/2016   Procedure: LAPAROSCOPIC CHOLECYSTECTOMY;  Surgeon: Christene Lye, MD;  Location: [facility]S;  Service: General;  Laterality: N/A;   CLIPPING OF ATRIAL APPENDAGE  04/29/2020   Procedure: CLIPPING OF ATRIAL APPENDAGE USING ATRICURE  PRO2 CLIP SIZE  45MM;  Surgeon: Rexene Alberts, MD;  Location: [facility] OR;  Service: Open Heart Surgery;;   COLONOSCOPY WITH PROPOFOL N/A 09/26/2018   Procedure: COLONOSCOPY WITH PROPOFOL;  Surgeon: Lucilla Lame, MD;  Location[facility]COPY;  Service: Endoscopy;  Laterality: N/A;   COLONOSCOPY WITH PROPOFOL N/A 07/16/2021   Procedure: COLONOSCOPY WITH PROPOFOL;  Surgeon: Lucilla Lame, MD;  Location: [facility] ENDOSCOPY;  Service: Endoscopy;  Laterality: N/A;   CYSTOSCOPY W/ RETROGRADES Right 08/18/2018   Procedure: CYSTOSCOPY WITH RETROGRADE PYELOGRAM;  Surgeon: Billey Co, MD;  Location: [facility];  Service: Urology;  Laterality: Right;   CYSTOSCOPY/URETEROSCOPY/HOLMIUM LASER/STENT PLACEMENT Right 08/18/2018   Procedure: CYSTOSCOPY/URETEROSCOPY/STENT PLACEMENT;  Surgeon: Billey Co, MD;  Location: [facility]S;  Service: Urology;  Laterality: Right;   DIAGNOSTIC LAPAROSCOPY     ESOPHAGOGASTRODUODENOSCOPY N/A 07/16/2021   Procedure: ESOPHAGOGASTRODUODENOSCOPY (EGD);  Surgeon: Lucilla Lame, MD;  Location: [facility] ENDOSCOPY;  Service: Endoscopy;  Laterality: N/A;   ESOPHAGOGASTRODUODENOSCOPY (EGD) WITH PROPOFOL N/A 09/26/2018  Procedure: ESOPHAGOGASTRODUODENOSCOPY (EGD) WITH PROPOFOL;  Surgeon: Lucilla Lame, MD;  Location: [facility] ENDOSCOPY;  Service: Endoscopy;  Laterality: N/A;   GIVENS CAPSULE STUDY N/A 11/03/2018   Procedure: GIVENS CAPSULE STUDY;  Surgeon: Lucilla Lame, MD;  Location: [facility] ENDOSCOPY;  Service: Endoscopy;  Laterality: N/A;   JOINT REPLACEMENT Left    knee   KNEE ARTHROPLASTY Right 04/09/2019   Procedure: COMPUTER ASSISTED TOTAL KNEE ARTHROPLASTY;  Surgeon: Dereck Leep, MD;  Location: [facility] ORS;  Service: Orthopedics;  Laterality: Right;   KNEE CLOSED REDUCTION Left 04/15/2015   Procedure: CLOSED MANIPULATION KNEE;  Surgeon: Thornton Park, MD;  Location: [facility] ORS;  Service: Orthopedics;  Laterality: Left;   KNEE SURGERY     MAZE N/A 04/29/2020   Procedure: MAZE;  Surgeon: Rexene Alberts, MD;   Location: [place];  Service: Open Heart Surgery;  Laterality: N/A;   MITRAL VALVE REPLACEMENT N/A 04/29/2020   Procedure: MITRAL VALVE (MV) REPLACEMENT USING ON-X VALVE SIZE 27/29MM;  Surgeon: Rexene Alberts, MD;  Location: [place];  Service: Open Heart Surgery;  Laterality: N/A;   MULTIPLE EXTRACTIONS WITH ALVEOLOPLASTY N/A 02/28/2020   Procedure: MULTIPLE EXTRACTION WITH ALVEOLOPLASTY;  Surgeon: [name], DMD;  Location: [place];  Service: Dentistry;  Laterality: N/A;   RIGHT/LEFT HEART CATH AND CORONARY ANGIOGRAPHY Bilateral 09/06/2019   Procedure: RIGHT/LEFT HEART CATH AND CORONARY ANGIOGRAPHY;  Surgeon: Minna Merritts, MD;  Location: [place] CV LAB;  Service: Cardiovascular;  Laterality: Bilateral;   TEE WITHOUT CARDIOVERSION N/A 09/13/2017   Procedure: TRANSESOPHAGEAL ECHOCARDIOGRAM (TEE);  Surgeon: Nelva Bush, MD;  Location: [facility];  Service: Cardiovascular;  Laterality: N/A;   TEE WITHOUT CARDIOVERSION N/A 04/01/2020   Procedure: TRANSESOPHAGEAL ECHOCARDIOGRAM (TEE);  Surgeon: [name], MD;  Location: [facility] ENDOSCOPY;  Service: Cardiovascular;  Laterality: N/A;   TEE WITHOUT CARDIOVERSION N/A 04/29/2020   Procedure: TRANSESOPHAGEAL ECHOCARDIOGRAM (TEE);  Surgeon: Rexene Alberts, MD;  Location: [place];  Service: Open Heart Surgery;  Laterality: N/A;   TOTAL KNEE ARTHROPLASTY Left 12/25/2014   Procedure: TOTAL KNEE ARTHROPLASTY;  Surgeon: Thornton Park, MD;  Location: [facility];  Service: Orthopedics;  Laterality: Left;   TUBAL LIGATION       Current Meds  Medication Sig   albuterol (VENTOLIN HFA) 108 (90 Base) MCG/ACT inhaler INHALE 2 PUFFS INTO THE LUNGS EVERY 6 HOURS AS NEEDED FOR WHEEZING OR SHORTNESS OF BREATH   ALPRAZolam (XANAX) 0.5 MG tablet TAKE 1/2 TABLET BY MOUTH AT BEDTIME AS NEEDED FOR ANXIETY   amLODipine (NORVASC) 10 MG tablet TAKE 1 TABLET(10 MG) BY MOUTH DAILY   aspirin 81 MG EC tablet Take 81 mg by mouth daily.   atorvastatin (LIPITOR) 80 MG  tablet Take 1 tablet (80 mg total) by mouth daily.   calcium carbonate (TUMS - DOSED IN MG ELEMENTAL CALCIUM) 500 MG chewable tablet Chew 500 mg by mouth daily as needed for indigestion or heartburn.   chlorpheniramine-HYDROcodone (TUSSIONEX) 10-8 MG/5ML Take 5 mLs by mouth every 12 (twelve) hours as needed.   doxycycline (VIBRA-TABS) 100 MG tablet Take 1 tablet (100 mg total) by mouth 2 (two) times daily.   FEROCON capsule TAKE ONE CAPSULE BY MOUTH TWICE DAILY AFTER MEALS   metoprolol succinate (TOPROL-XL) 100 MG 24 hr tablet TAKE 1 TABLET(100 MG) BY MOUTH TWICE DAILY WITH OR IMMEDIATELY FOLLOWING A MEAL   omeprazole (PRILOSEC) 40 MG capsule TAKE ONE CAPSULE BY MOUTH DAILY   potassium chloride SA (KLOR-CON M) 20 MEQ tablet Take 4 tabs (  80 MEQ) every AM, and take 3 tabs (60 MEQ) every PM.   spironolactone (ALDACTONE) 25 MG tablet Take 1 tablet (25 mg total) by mouth daily.   sucralfate (CARAFATE) 1 g tablet Take 1 tablet (1 g total) by mouth 4 (four) times daily -  with meals and at bedtime.   torsemide (DEMADEX) 20 MG tablet Take 1 tablet (20 mg total) by mouth 2 (two) times daily.   warfarin (COUMADIN) 2 MG tablet TAKE 1/2 TO 1 TABLET BY MOUTH DAILY AS DIRECTED BY THE ANTI-COAG CLINIC (Patient taking differently: Take 1-2 mg by mouth See admin instructions. Take 2 mg every day every day except on Sunday Thursday take 1 mg per patient)     Allergies:   Morphine, Oxycodone hcl, and Dilaudid [hydromorphone hcl]   Social History   Tobacco Use   Smoking status: Former    Packs/day: 0.50    Years: 20.00    Total pack years: 10.00    Types: Cigarettes    Quit date: 01/2016    Years since quitting: 6.0   Smokeless tobacco: Never  Vaping Use   Vaping Use: Never used  Substance Use Topics   Alcohol use: Yes    Comment: rarely   Drug use: No     Family Hx: The patient's family history includes Cancer in her maternal aunt and maternal grandmother; Cancer (age of onset: 5) in her mother;  Hypertension in her father and mother; Osteoarthritis in her father.  ROS:   Please see the history of present illness.    Review of Systems  Constitutional: Negative.   Respiratory: Negative.    Cardiovascular: Negative.   Gastrointestinal: Negative.   Musculoskeletal: Negative.   Neurological: Negative.   Psychiatric/Behavioral: Negative.    All other systems reviewed and are negative.   Labs/Other Tests and Data Reviewed:     Recent Labs: 06/26/2021: TSH 0.658 11/16/2021: ALT 41 12/14/2021: Magnesium 1.8 01/12/2022: B Natriuretic Peptide 83.7; Hemoglobin 11.8; Platelets 304 01/15/2022: BUN 35; Creatinine, Ser 2.07; Potassium 4.4; Sodium 138   Recent Lipid Panel Lab Results  Component Value Date/Time   CHOL 170 06/26/2021 09:12 AM   TRIG 160 (H) 06/26/2021 09:12 AM   HDL 36 (L) 06/26/2021 09:12 AM   CHOLHDL 5.2 09/19/2018 09:32 AM   LDLCALC 106 (H) 06/26/2021 09:12 AM    Wt Readings from Last 3 Encounters:  02/10/22 240 lb (108.9 kg)  02/08/22 240 lb 9.6 oz (109.1 kg)  01/12/22 235 lb 14.3 oz (107 kg)     Objective:    Vital Signs:  BP (!) 150/74 (BP Location: Left Arm, Patient Position: Sitting, Cuff Size: Large)   Pulse 61   Ht '5\' 4"'$  (1.626 m)   Wt 240 lb (108.9 kg)   SpO2 99%   BMI 41.20 kg/m   Constitutional:  oriented to person, place, and time. No distress.  Obese HENT:  Head: Grossly normal Eyes:  no discharge. No scleral icterus.  Neck: No JVD, no carotid bruits  Cardiovascular: Regular rate and rhythm, no murmurs appreciated Pulmonary/Chest: Clear to auscultation bilaterally, no wheezes or rails Abdominal: Soft.  no distension.  no tenderness.  Musculoskeletal: Normal range of motion Neurological:  normal muscle tone. Coordination normal. No atrophy Skin: Skin warm and dry Psychiatric: normal affect, pleasant   ASSESSMENT & PLAN:    Mitral valve stenosis With moderate pulmonary hypertension S/p replacement Echocardiogram July 2023 with  mild to moderately elevated right heart pressures Stressed the importance of taking her torsemide  20 twice daily with spironolactone.  Extra torsemide as needed for leg swelling, nominal distention concerning for fluid retention  Chronic diastolic CHF Ejection fraction estimated 60 to 65% on echo July 2023 Will continue torsemide 20 twice daily with spironolactone Fluid restriction recommended Symptoms exacerbated by obesity, underlying mitral valve disease   Paroxysmal atrial fibrillation On anticoagulation, maintaining normal sinus rhythm Continue metoprolol succinate 100 twice daily Stable  Morbid obesity We have encouraged continued exercise, careful diet management in an effort to lose weight.  Weight is up 5 pounds compared to prior clinic visit  Headache/sinus pressure Likely getting over a viral infection, spent several days in bed recovering   Total encounter time more than 30 minutes  Greater than 50% was spent in counseling and coordination of care with the patient   Signed, Ida Rogue, MD  02/10/2022 8:37 AM    [place]

## 2022-02-10 ENCOUNTER — Encounter: Payer: Self-pay | Admitting: Cardiovascular Disease

## 2022-02-10 ENCOUNTER — Ambulatory Visit (INDEPENDENT_AMBULATORY_CARE_PROVIDER_SITE_OTHER): Payer: BLUE CROSS/BLUE SHIELD

## 2022-02-10 ENCOUNTER — Ambulatory Visit: Payer: BLUE CROSS/BLUE SHIELD | Attending: Cardiovascular Disease | Admitting: Cardiovascular Disease

## 2022-02-10 ENCOUNTER — Telehealth: Payer: Self-pay

## 2022-02-10 VITALS — BP 150/74 | HR 61 | Ht 64.0 in | Wt 240.0 lb

## 2022-02-10 DIAGNOSIS — I480 Paroxysmal atrial fibrillation: Secondary | ICD-10-CM | POA: Diagnosis not present

## 2022-02-10 DIAGNOSIS — Z952 Presence of prosthetic heart valve: Secondary | ICD-10-CM

## 2022-02-10 DIAGNOSIS — E876 Hypokalemia: Secondary | ICD-10-CM

## 2022-02-10 DIAGNOSIS — I4891 Unspecified atrial fibrillation: Secondary | ICD-10-CM

## 2022-02-10 DIAGNOSIS — I050 Rheumatic mitral stenosis: Secondary | ICD-10-CM | POA: Diagnosis not present

## 2022-02-10 DIAGNOSIS — E782 Mixed hyperlipidemia: Secondary | ICD-10-CM

## 2022-02-10 DIAGNOSIS — I5032 Chronic diastolic (congestive) heart failure: Secondary | ICD-10-CM | POA: Diagnosis not present

## 2022-02-10 DIAGNOSIS — Z5181 Encounter for therapeutic drug level monitoring: Secondary | ICD-10-CM

## 2022-02-10 DIAGNOSIS — I10 Essential (primary) hypertension: Secondary | ICD-10-CM

## 2022-02-10 LAB — POCT INR: INR: 2.7 (ref 2.0–3.0)

## 2022-02-10 NOTE — Patient Instructions (Signed)
continue dosage warfarin 1 whole tablet daily EXCEPT 1/2 tablet on MONDAYS, WEDNESDAYS & FRIDAYS;   - Recheck INR in 6 weeks

## 2022-02-10 NOTE — Telephone Encounter (Signed)
PA was denied for [facility].

## 2022-02-10 NOTE — Patient Instructions (Addendum)
Medication Instructions:  Ok to change Ferocon to OTC ferrous sulfate or gluconate  Look for sinus medication with pseudoephedrine Not the phenylephrine  If you need a refill on your cardiac medications before your next appointment, please call your pharmacy.   Lab work: No new labs needed  Testing/Procedures: No new testing needed  Follow-Up: At [facility], you and your health needs are our priority.  As part of our continuing mission to provide you with exceptional heart care, we have created designated Provider Care Teams.  These Care Teams include your primary Cardiologist (physician) and Advanced Practice Providers (APPs -  Physician Assistants and Nurse Practitioners) who all work together to provide you with the care you need, when you need it.  You will need a follow up appointment in 6 months, APP ok  Providers on your designated Care Team:   Murray Hodgkins, NP Christell Faith, PA-C Cadence Kathlen Mody, [place]  COVID-19 Vaccine Information can be found at: [email or web address] For questions related to vaccine distribution or appointments, please email [email or web address] or call [phone].

## 2022-02-15 ENCOUNTER — Ambulatory Visit: Payer: Medicaid Other | Admitting: Physician Assistant

## 2022-02-15 ENCOUNTER — Telehealth: Payer: Self-pay | Admitting: Cardiovascular Disease

## 2022-02-15 NOTE — Telephone Encounter (Signed)
Patient's husband states he plans to surprise the patient and take her on a trip for her birthday this year. He would like to confirm whether or not she is cleared to fly in an airplane and to discuss any restrictions. Please advise.

## 2022-02-15 NOTE — Telephone Encounter (Signed)
To Dr. Rockey Situ to review/ provide recommendations.

## 2022-02-16 ENCOUNTER — Telehealth: Payer: Self-pay | Admitting: Cardiovascular Disease

## 2022-02-16 NOTE — Telephone Encounter (Signed)
Patient husband is wanting to confirm wife ok to fly in June wants to surprise her with a trip. Please call husband on cell phone.

## 2022-02-17 ENCOUNTER — Other Ambulatory Visit: Payer: Self-pay | Admitting: Cardiovascular Disease

## 2022-02-17 DIAGNOSIS — I5032 Chronic diastolic (congestive) heart failure: Secondary | ICD-10-CM

## 2022-02-17 DIAGNOSIS — I050 Rheumatic mitral stenosis: Secondary | ICD-10-CM

## 2022-02-17 DIAGNOSIS — Z954 Presence of other heart-valve replacement: Secondary | ICD-10-CM

## 2022-02-17 NOTE — Telephone Encounter (Signed)
Pt's husband made aware and verbalized understanding.   Minna Merritts, MD  Cv Div [place] Triage1 hour ago (12:25 PM)    Patient should be safe to travel We can let husband know Thx TGollan

## 2022-02-22 ENCOUNTER — Encounter: Payer: Self-pay | Admitting: Internal Medicine

## 2022-02-24 ENCOUNTER — Encounter: Payer: Self-pay | Admitting: Internal Medicine

## 2022-02-24 ENCOUNTER — Ambulatory Visit: Payer: BLUE CROSS/BLUE SHIELD | Admitting: Dermatology

## 2022-02-24 DIAGNOSIS — N2581 Secondary hyperparathyroidism of renal origin: Secondary | ICD-10-CM | POA: Diagnosis not present

## 2022-02-24 DIAGNOSIS — I129 Hypertensive chronic kidney disease with stage 1 through stage 4 chronic kidney disease, or unspecified chronic kidney disease: Secondary | ICD-10-CM | POA: Diagnosis not present

## 2022-02-24 DIAGNOSIS — E876 Hypokalemia: Secondary | ICD-10-CM | POA: Diagnosis not present

## 2022-02-24 DIAGNOSIS — N184 Chronic kidney disease, stage 4 (severe): Secondary | ICD-10-CM | POA: Diagnosis not present

## 2022-03-01 ENCOUNTER — Other Ambulatory Visit: Payer: Self-pay

## 2022-03-01 DIAGNOSIS — E785 Hyperlipidemia, unspecified: Secondary | ICD-10-CM

## 2022-03-01 MED ORDER — SUCRALFATE 1 G PO TABS: 1.0000 g | ORAL_TABLET | Freq: Three times a day (TID) | ORAL | 2 refills | Status: DC

## 2022-03-01 MED ORDER — ATORVASTATIN CALCIUM 80 MG PO TABS: 80.0000 mg | ORAL_TABLET | Freq: Every day | ORAL | 2 refills | Status: DC

## 2022-03-09 ENCOUNTER — Encounter: Payer: Self-pay | Admitting: Internal Medicine

## 2022-03-15 ENCOUNTER — Encounter: Payer: Self-pay | Admitting: Physician Assistant

## 2022-03-15 ENCOUNTER — Ambulatory Visit (INDEPENDENT_AMBULATORY_CARE_PROVIDER_SITE_OTHER): Payer: Medicaid Other | Admitting: Physician Assistant

## 2022-03-15 VITALS — BP 140/80 | HR 65 | Temp 98.2°F | Resp 16 | Ht 64.0 in | Wt 247.4 lb

## 2022-03-15 DIAGNOSIS — Z954 Presence of other heart-valve replacement: Secondary | ICD-10-CM

## 2022-03-15 DIAGNOSIS — Z113 Encounter for screening for infections with a predominantly sexual mode of transmission: Secondary | ICD-10-CM

## 2022-03-15 DIAGNOSIS — E1165 Type 2 diabetes mellitus with hyperglycemia: Secondary | ICD-10-CM | POA: Diagnosis not present

## 2022-03-15 DIAGNOSIS — I5032 Chronic diastolic (congestive) heart failure: Secondary | ICD-10-CM

## 2022-03-15 DIAGNOSIS — I480 Paroxysmal atrial fibrillation: Secondary | ICD-10-CM

## 2022-03-15 DIAGNOSIS — N390 Urinary tract infection, site not specified: Secondary | ICD-10-CM

## 2022-03-15 DIAGNOSIS — R300 Dysuria: Secondary | ICD-10-CM

## 2022-03-15 DIAGNOSIS — I050 Rheumatic mitral stenosis: Secondary | ICD-10-CM

## 2022-03-15 LAB — POCT URINALYSIS DIPSTICK
Bilirubin, UA: NEGATIVE
Blood, UA: NEGATIVE
Glucose, UA: NEGATIVE
Ketones, UA: NEGATIVE
Nitrite, UA: NEGATIVE
Protein, UA: NEGATIVE
Spec Grav, UA: 1.01 (ref 1.010–1.025)
Urobilinogen, UA: 0.2 E.U./dL
pH, UA: 5 (ref 5.0–8.0)

## 2022-03-15 MED ORDER — TIRZEPATIDE 2.5 MG/0.5ML ~~LOC~~ SOAJ: 2.5000 mg | SUBCUTANEOUS | 2 refills | Status: DC

## 2022-03-15 MED ORDER — FLUCONAZOLE 150 MG PO TABS: 150.0000 mg | ORAL_TABLET | Freq: Once | ORAL | 0 refills | Status: AC

## 2022-03-15 MED ORDER — NITROFURANTOIN MONOHYD MACRO 100 MG PO CAPS: ORAL_CAPSULE | ORAL | 0 refills | Status: DC

## 2022-03-15 NOTE — Progress Notes (Signed)
[facility] [place], [place] [address]  Internal MEDICINE  Office Visit Note  Patient Name: Leslie [name]  [id number]  [id number]  Date of Service: 03/22/2022  Chief Complaint  Patient presents with   Back Pain    Lower back pain for 4 days, some abdominal pain localized to the right side. Some vaginal odor.     HPI Pt is here for a sick visit. -Pt has been having lower back pain for the past 4 days and some lower right side abdominal pain. Does have some vaginal odor as well -Nephrology added losartan a few weeks ago and amlodipine decreased to '5mg'$  -Does have hx of 35m non-obstructing right renal stone in CT abdomen Oct 2023, however when she was referred to urology she was told she did not have a stone? -Burning and frequency of urination -Drinking plenty of water  Current Medication:  Outpatient Encounter Medications as of 03/15/2022  Medication Sig   albuterol (VENTOLIN HFA) 108 (90 Base) MCG/ACT inhaler INHALE 2 PUFFS INTO THE LUNGS EVERY 6 HOURS AS NEEDED FOR WHEEZING OR SHORTNESS OF BREATH   amLODipine (NORVASC) 10 MG tablet TAKE 1 TABLET(10 MG) BY MOUTH DAILY   aspirin 81 MG EC tablet Take 81 mg by mouth daily.   atorvastatin (LIPITOR) 80 MG tablet Take 1 tablet (80 mg total) by mouth daily.   calcium carbonate (TUMS - DOSED IN MG ELEMENTAL CALCIUM) 500 MG chewable tablet Chew 500 mg by mouth daily as needed for indigestion or heartburn.   chlorpheniramine-HYDROcodone (TUSSIONEX) 10-8 MG/5ML Take 5 mLs by mouth every 12 (twelve) hours as needed.   ferrous sulfate 325 (65 FE) MG EC tablet Take 1 tablet (325 mg total) by mouth in the morning and at bedtime.   [EXPIRED] fluconazole (DIFLUCAN) 150 MG tablet Take 1 tablet (150 mg total) by mouth once for 1 dose.   losartan (COZAAR) 25 MG tablet Take 25 mg by mouth daily.   metoprolol succinate (TOPROL-XL) 100 MG 24 hr tablet TAKE 1 TABLET(100 MG) BY MOUTH TWICE DAILY WITH OR IMMEDIATELY FOLLOWING A  MEAL   omeprazole (PRILOSEC) 40 MG capsule TAKE ONE CAPSULE BY MOUTH DAILY   potassium chloride SA (KLOR-CON M) 20 MEQ tablet Take 4 tabs (80 MEQ) every AM, and take 3 tabs (60 MEQ) every PM.   spironolactone (ALDACTONE) 25 MG tablet Take 1 tablet (25 mg total) by mouth daily.   sucralfate (CARAFATE) 1 g tablet Take 1 tablet (1 g total) by mouth 4 (four) times daily -  with meals and at bedtime.   torsemide (DEMADEX) 20 MG tablet Take 1 tablet (20 mg total) by mouth 2 (two) times daily.   warfarin (COUMADIN) 2 MG tablet TAKE 1/2 TO 1 TABLET BY MOUTH DAILY AS DIRECTED BY THE ANTI-COAG CLINIC (Patient taking differently: Take 1-2 mg by mouth See admin instructions. Take 2 mg every day every day except on Sunday Thursday take 1 mg per patient)   [DISCONTINUED] ALPRAZolam (XANAX) 0.5 MG tablet TAKE 1/2 TABLET BY MOUTH AT BEDTIME AS NEEDED FOR ANXIETY   [DISCONTINUED] doxycycline (VIBRA-TABS) 100 MG tablet Take 1 tablet (100 mg total) by mouth 2 (two) times daily.   [DISCONTINUED] nitrofurantoin, macrocrystal-monohydrate, (MACROBID) 100 MG capsule Take 1 cap twice per day for 10 days.   [DISCONTINUED] tirzepatide ([facility] 2.5 MG/0.5ML Pen Inject 2.5 mg into the skin once a week.   [DISCONTINUED] tirzepatide ([facility] 2.5 MG/0.5ML Pen Inject 2.5 mg into the skin once a week.  No facility-administered encounter medications on file as of 03/15/2022.      Medical History: Past Medical History:  Diagnosis Date   (HFpEF) heart failure with preserved ejection fraction ([place])    a. 08/2017 Echo: EF 55-60%.  Grade 2 diastolic dysfunction; b. [id number] Echo: EF 50-55%, no rwma, Nl RV fxn, nl fxn'ing mech MV.   Allergy    Anemia    Anxiety    Asthma    BRCA negative 03/22/2013   Bronchitis 02/19/2016   ON LEVAQUIN PO   Chest tightness    a. 08/2019 Cath: nl cors.   Cigarette smoker 09/11/2017   8-10 day   Dysrhythmia    GERD (gastroesophageal reflux disease)    History of kidney stones     Hyperlipidemia    Hypertension    Interstitial lung disease ([place])    a. CT 2013 b. 02/2018 CXR noted recurrent intersistial changes ILD vs chronic bronchitis   Moderate mitral stenosis    a.  08/2017 TEE: EF 60 to 65%.  Moderate mitral stenosis.  Mean gradient 14 mmHg.  Valve area 2.59 cm by planimetry, 2.72 cm by pressure half-time.   PAF (paroxysmal atrial fibrillation) ([place])    a. 08/2017 s/p TEE/DCCV; b. CHA2DS2VASc = 2-->warfarin.   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 04/2020 s/p 27/29 mm Onyx mech mitral valve-->chronic coumadin; b. 05/2020 Echo: Nl fxn'ing mech MV.     Vital Signs: BP (!) 140/80   Pulse 65   Temp 98.2 F (36.8 C)   Resp 16   Ht '5\' 4"'$  (1.626 m)   Wt 247 lb 6.4 oz (112.2 kg)   SpO2 99%   BMI 42.47 kg/m    Review of Systems  Constitutional:  Negative for fatigue and fever.  HENT:  Negative for congestion, mouth sores and postnasal drip.   Respiratory:  Negative for cough.   Cardiovascular:  Negative for chest pain.  Gastrointestinal:  Positive for abdominal pain.  Genitourinary:  Positive for flank pain and frequency.       Vaginal odor  Musculoskeletal:  Positive for back pain.  Psychiatric/Behavioral: Negative.      Physical Exam Vitals and nursing note reviewed.  Constitutional:      General: She is not in acute distress.    Appearance: Normal appearance. She is well-developed. She is obese. She is not diaphoretic.  HENT:     Head: Normocephalic and atraumatic.     Mouth/Throat:     Pharynx: No oropharyngeal exudate.  Eyes:     Pupils: Pupils are equal, round, and reactive to light.  Neck:     Thyroid: No thyromegaly.     Vascular: No JVD.     Trachea: No tracheal deviation.  Cardiovascular:     Rate and  Rhythm: Normal rate and regular rhythm.     Heart sounds: Normal heart sounds. No murmur heard.    No friction rub. No gallop.  Pulmonary:     Effort: Pulmonary effort is normal. No respiratory distress.     Breath sounds: No wheezing or rales.  Chest:     Chest wall: No tenderness.  Abdominal:     General: Bowel sounds are normal.     Palpations: Abdomen is soft.     Tenderness: There is no  abdominal tenderness. There is right CVA tenderness.  Musculoskeletal:        General: Normal range of motion.     Cervical back: Normal range of motion and neck supple.  Lymphadenopathy:     Cervical: No cervical adenopathy.  Skin:    General: Skin is warm and dry.  Neurological:     Mental Status: She is alert and oriented to person, place, and time.     Cranial Nerves: No cranial nerve deficit.  Psychiatric:        Behavior: Behavior normal.        Thought Content: Thought content normal.        Judgment: Judgment normal.       Assessment/Plan: 1. Dysuria - POCT Urinalysis Dipstick  2. Urinary tract infection without hematuria, site unspecified Will go ahead and treat with macrobid and adjust based on C/S. Given diflucan to have on hand if needed as she is very prone to yeast infections. Drink plenty of water - CULTURE, URINE COMPREHENSIVE - fluconazole (DIFLUCAN) 150 MG tablet; Take 1 tablet (150 mg total) by mouth once for 1 dose.  Dispense: 1 tablet; Refill: 0  3. Type 2 diabetes mellitus with hyperglycemia, without long-term current use of insulin (HCC) Will resend mounjaro, if not approved then may try alternative  4. Screening for STDs (sexually transmitted diseases) Pt completed self-swab and will treat based on results as indicated - NuSwab Vaginitis Plus (VG+)   General Counseling: Kimarie verbalizes understanding of the findings of todays visit and agrees with plan of treatment. I have discussed any further diagnostic evaluation that may be needed or ordered today. We  also reviewed her medications today. she has been encouraged to call the office with any questions or concerns that should arise related to todays visit.    Counseling:    Orders Placed This Encounter  Procedures   CULTURE, URINE COMPREHENSIVE   NuSwab Vaginitis Plus (VG+)   POCT Urinalysis Dipstick    Meds ordered this encounter  Medications   DISCONTD: tirzepatide (MOUNJARO) 2.5 MG/0.5ML Pen    Sig: Inject 2.5 mg into the skin once a week.    Dispense:  2 mL    Refill:  2   DISCONTD: nitrofurantoin, macrocrystal-monohydrate, (MACROBID) 100 MG capsule    Sig: Take 1 cap twice per day for 10 days.    Dispense:  20 capsule    Refill:  0   fluconazole (DIFLUCAN) 150 MG tablet    Sig: Take 1 tablet (150 mg total) by mouth once for 1 dose.    Dispense:  1 tablet    Refill:  0    Time spent:30 Minutes

## 2022-03-17 ENCOUNTER — Encounter: Payer: Self-pay | Admitting: Internal Medicine

## 2022-03-17 LAB — NUSWAB VAGINITIS PLUS (VG+)
Candida albicans, NAA: NEGATIVE
Candida glabrata, NAA: NEGATIVE
Chlamydia trachomatis, NAA: NEGATIVE
Neisseria gonorrhoeae, NAA: NEGATIVE
Trich vag by NAA: NEGATIVE

## 2022-03-18 ENCOUNTER — Other Ambulatory Visit: Payer: Self-pay | Admitting: Physician Assistant

## 2022-03-18 DIAGNOSIS — N390 Urinary tract infection, site not specified: Secondary | ICD-10-CM

## 2022-03-18 LAB — CULTURE, URINE COMPREHENSIVE

## 2022-03-18 MED ORDER — AMPICILLIN 500 MG PO CAPS: 500.0000 mg | ORAL_CAPSULE | Freq: Three times a day (TID) | ORAL | 0 refills | Status: DC

## 2022-03-19 ENCOUNTER — Telehealth: Payer: Self-pay

## 2022-03-19 ENCOUNTER — Other Ambulatory Visit: Payer: Self-pay | Admitting: Physician Assistant

## 2022-03-19 DIAGNOSIS — F419 Anxiety disorder, unspecified: Secondary | ICD-10-CM

## 2022-03-19 MED ORDER — ALPRAZOLAM 0.5 MG PO TABS: ORAL_TABLET | ORAL | 2 refills | Status: DC

## 2022-03-19 NOTE — Telephone Encounter (Signed)
done 

## 2022-03-22 ENCOUNTER — Other Ambulatory Visit: Payer: Self-pay | Admitting: Physician Assistant

## 2022-03-22 DIAGNOSIS — E1165 Type 2 diabetes mellitus with hyperglycemia: Secondary | ICD-10-CM

## 2022-03-22 MED ORDER — TRULICITY 0.75 MG/0.5ML ~~LOC~~ SOAJ: 0.7500 mg | SUBCUTANEOUS | 1 refills | Status: DC

## 2022-03-23 ENCOUNTER — Encounter: Payer: Self-pay | Admitting: Internal Medicine

## 2022-03-24 ENCOUNTER — Ambulatory Visit: Payer: 59 | Attending: Cardiovascular Disease

## 2022-03-24 DIAGNOSIS — I4891 Unspecified atrial fibrillation: Secondary | ICD-10-CM | POA: Diagnosis not present

## 2022-03-24 DIAGNOSIS — Z5181 Encounter for therapeutic drug level monitoring: Secondary | ICD-10-CM | POA: Diagnosis not present

## 2022-03-24 DIAGNOSIS — I050 Rheumatic mitral stenosis: Secondary | ICD-10-CM

## 2022-03-24 LAB — POCT INR: INR: 2.5 (ref 2.0–3.0)

## 2022-03-24 NOTE — Patient Instructions (Signed)
Description    continue dosage warfarin 1 whole tablet daily EXCEPT 1/2 tablet on MONDAYS, WEDNESDAYS & FRIDAYS;   - Recheck INR in 6 weeks

## 2022-03-28 ENCOUNTER — Other Ambulatory Visit: Payer: Self-pay | Admitting: Nurse Practitioner

## 2022-03-31 ENCOUNTER — Other Ambulatory Visit: Payer: Self-pay | Admitting: Nurse Practitioner

## 2022-04-02 ENCOUNTER — Telehealth: Payer: Self-pay

## 2022-04-02 NOTE — Telephone Encounter (Signed)
Completed P.A. for patient's Trulicity.

## 2022-04-05 ENCOUNTER — Encounter: Payer: Self-pay | Admitting: Physician Assistant

## 2022-04-05 ENCOUNTER — Ambulatory Visit (INDEPENDENT_AMBULATORY_CARE_PROVIDER_SITE_OTHER): Payer: 59 | Admitting: Physician Assistant

## 2022-04-05 ENCOUNTER — Other Ambulatory Visit
Admission: RE | Admit: 2022-04-05 | Discharge: 2022-04-05 | Disposition: A | Discharge status: Home / Self Care | Payer: 59 | Source: Ambulatory Visit | Attending: Physician Assistant | Admitting: Physician Assistant

## 2022-04-05 VITALS — BP 128/70 | HR 60 | Temp 98.4°F | Resp 16 | Ht 64.0 in | Wt 247.2 lb

## 2022-04-05 DIAGNOSIS — E876 Hypokalemia: Secondary | ICD-10-CM

## 2022-04-05 DIAGNOSIS — N390 Urinary tract infection, site not specified: Secondary | ICD-10-CM

## 2022-04-05 DIAGNOSIS — I10 Essential (primary) hypertension: Secondary | ICD-10-CM | POA: Diagnosis not present

## 2022-04-05 DIAGNOSIS — R300 Dysuria: Secondary | ICD-10-CM | POA: Diagnosis present

## 2022-04-05 DIAGNOSIS — E1165 Type 2 diabetes mellitus with hyperglycemia: Secondary | ICD-10-CM | POA: Diagnosis not present

## 2022-04-05 LAB — POCT URINALYSIS DIPSTICK
Bilirubin, UA: NEGATIVE
Blood, UA: NEGATIVE
Glucose, UA: NEGATIVE
Leukocytes, UA: NEGATIVE
Nitrite, UA: NEGATIVE
Protein, UA: NEGATIVE
Spec Grav, UA: 1.01 (ref 1.010–1.025)
Urobilinogen, UA: 0.2 E.U./dL
pH, UA: 5 (ref 5.0–8.0)

## 2022-04-05 LAB — BASIC METABOLIC PANEL
Anion gap: 11 (ref 5–15)
BUN: 31 mg/dL — ABNORMAL HIGH (ref 6–20)
CO2: 29 mmol/L (ref 22–32)
Calcium: 9.2 mg/dL (ref 8.9–10.3)
Chloride: 100 mmol/L (ref 98–111)
Creatinine, Ser: 1.98 mg/dL — ABNORMAL HIGH (ref 0.44–1.00)
GFR, Estimated: 29 mL/min — ABNORMAL LOW (ref >60)
Glucose, Bld: 142 mg/dL — ABNORMAL HIGH (ref 70–99)
Potassium: 3.8 mmol/L (ref 3.5–5.1)
Sodium: 140 mmol/L (ref 135–145)

## 2022-04-05 NOTE — Progress Notes (Signed)
[facility] [place], [place] [address]  Internal MEDICINE  Office Visit Note  Patient Name: Leslie [name]  [id number]  [id number]  Date of Service: 04/05/2022  Chief Complaint  Patient presents with   Follow-up   Shortness of Breath    Started 1 week ago after Nephrologist changed Potassium, Losartan and Amlodipine medication dosages.    HPI Pt is here for routine follow up -more SOB for the past week and is having more cramping. States it feels like it did when her potassium first dropped. -She states that her potassium doing was adjusted down when losartan added by nephrology. Will need repeat labs to check on this. Her BP is well controlled. She was taking 4 tabs of 84meq potassium in AM and 3 tabs in PM. She is now taking 2 tabs BID since Feb 7th nephrology visit. -She does take extra torsemide if ankle swelling/fluid retention -Still having some right flank pain, had gotten better after UTI treated. Some burning and urgency again as well and will check urine. She may also be experiencing kidney stone again and will discuss with nephrology at upcoming visit. -She states she is supposed to have some blood work with nephrology as well in the next week and discussed if potassium is low and changed after today's labs, then these could be used to check on improvement -Darcel Bayley was denies, and now working on [employer] which has been submitted but not yet approved. Will plan to start on this once approved -patient advised if new or worsening symptoms to go to ED  Current Medication: Outpatient Encounter Medications as of 04/05/2022  Medication Sig   albuterol (VENTOLIN HFA) 108 (90 Base) MCG/ACT inhaler INHALE 2 PUFFS INTO THE LUNGS EVERY 6 HOURS AS NEEDED FOR WHEEZING OR SHORTNESS OF BREATH   ALPRAZolam (XANAX) 0.5 MG tablet TAKE 1/2 TABLET BY MOUTH AT BEDTIME AS NEEDED FOR ANXIETY   amLODipine (NORVASC) 10 MG tablet TAKE 1 TABLET(10 MG) BY MOUTH DAILY  (Patient taking differently: Take 5 mg by mouth daily.)   ampicillin (PRINCIPEN) 500 MG capsule Take 1 capsule (500 mg total) by mouth 3 (three) times daily.   aspirin 81 MG EC tablet Take 81 mg by mouth daily.   atorvastatin (LIPITOR) 80 MG tablet Take 1 tablet (80 mg total) by mouth daily.   calcium carbonate (TUMS - DOSED IN MG ELEMENTAL CALCIUM) 500 MG chewable tablet Chew 500 mg by mouth daily as needed for indigestion or heartburn.   chlorpheniramine-HYDROcodone (TUSSIONEX) 10-8 MG/5ML Take 5 mLs by mouth every 12 (twelve) hours as needed.   Dulaglutide (TRULICITY) A999333 0000000 SOPN Inject 0.75 mg into the skin once a week.   ferrous [id number] C-folic acid (FEROCON) capsule TAKE 1 CAPSULE BY MOUTH TWICE A DAY AFTER MEALS   ferrous sulfate 325 (65 FE) MG EC tablet Take 1 tablet (325 mg total) by mouth in the morning and at bedtime.   losartan (COZAAR) 25 MG tablet Take 25 mg by mouth daily.   metoprolol succinate (TOPROL-XL) 100 MG 24 hr tablet TAKE 1 TABLET(100 MG) BY MOUTH TWICE DAILY WITH OR IMMEDIATELY FOLLOWING A MEAL   omeprazole (PRILOSEC) 40 MG capsule TAKE ONE CAPSULE BY MOUTH DAILY   potassium chloride SA (KLOR-CON M) 20 MEQ tablet Take 4 tabs (80 MEQ) every AM, and take 3 tabs (60 MEQ) every PM.   spironolactone (ALDACTONE) 25 MG tablet Take 1 tablet (25 mg total) by mouth daily.   sucralfate (CARAFATE) 1 g  tablet Take 1 tablet (1 g total) by mouth 4 (four) times daily -  with meals and at bedtime.   torsemide (DEMADEX) 20 MG tablet Take 1 tablet (20 mg total) by mouth 2 (two) times daily.   warfarin (COUMADIN) 2 MG tablet TAKE 1/2 TO 1 TABLET BY MOUTH DAILY AS DIRECTED BY THE ANTI-COAG CLINIC (Patient taking differently: Take 1-2 mg by mouth See admin instructions. Take 2 mg every day every day except on Sunday Thursday take 1 mg per patient)   No facility-administered encounter medications on file as of 04/05/2022.    Surgical History: Past Surgical History:   Procedure Laterality Date   ABDOMINAL HYSTERECTOMY     total   ABDOMINAL HYSTERECTOMY     BREAST BIOPSY Bilateral 2012   BREAST BIOPSY Right 08-07-12   fibroadenomatous changes and columnar cells   BREAST BIOPSY  02/03/2015   stereo byrnett   BUBBLE STUDY  04/01/2020   Procedure: BUBBLE STUDY;  Surgeon: [name], MD;  Location: [place];  Service: Cardiovascular;;   CARDIAC CATHETERIZATION     CHOLECYSTECTOMY N/A 02/27/2016   Procedure: LAPAROSCOPIC CHOLECYSTECTOMY;  Surgeon: Christene Lye, MD;  Location: [facility] ORS;  Service: General;  Laterality: N/A;   CLIPPING OF ATRIAL APPENDAGE  04/29/2020   Procedure: CLIPPING OF ATRIAL APPENDAGE USING ATRICURE  PRO2 CLIP SIZE 45MM;  Surgeon: Rexene Alberts, MD;  Location: [facility] OR;  Service: Open Heart Surgery;;   COLONOSCOPY WITH PROPOFOL N/A 09/26/2018   Procedure: COLONOSCOPY WITH PROPOFOL;  Surgeon: Lucilla Lame, MD;  Location: [facility] ENDOSCOPY;  Service: Endoscopy;  Laterality: N/A;   COLONOSCOPY WITH PROPOFOL N/A 07/16/2021   Procedure: COLONOSCOPY WITH PROPOFOL;  Surgeon: Lucilla Lame, MD;  Location: [facility] ENDOSCOPY;  Service: Endoscopy;  Laterality: N/A;   CYSTOSCOPY W/ RETROGRADES Right 08/18/2018   Procedure: CYSTOSCOPY WITH RETROGRADE PYELOGRAM;  Surgeon: Billey Co, MD;  Location: [facility] ORS;  Service: Urology;  Laterality: Right;   CYSTOSCOPY/URETEROSCOPY/HOLMIUM LASER/STENT PLACEMENT Right 08/18/2018   Procedure: CYSTOSCOPY/URETEROSCOPY/STENT PLACEMENT;  Surgeon: Billey Co, MD;  Location: [facility] ORS;  Service: Urology;  Laterality: Right;   DIAGNOSTIC LAPAROSCOPY     ESOPHAGOGASTRODUODENOSCOPY N/A 07/16/2021   Procedure: ESOPHAGOGASTRODUODENOSCOPY (EGD);  Surgeon: Lucilla Lame, MD;  Location: [facility] ENDOSCOPY;  Service: Endoscopy;  Laterality: N/A;   ESOPHAGOGASTRODUODENOSCOPY (EGD) WITH PROPOFOL N/A 09/26/2018   Procedure: ESOPHAGOGASTRODUODENOSCOPY (EGD) WITH PROPOFOL;  Surgeon: Lucilla Lame, MD;  Location: [facility]  ENDOSCOPY;  Service: Endoscopy;  Laterality: N/A;   GIVENS CAPSULE STUDY N/A 11/03/2018   Procedure: GIVENS CAPSULE STUDY;  Surgeon: Lucilla Lame, MD;  Location: [facility] ENDOSCOPY;  Service: Endoscopy;  Laterality: N/A;   JOINT REPLACEMENT Left    knee   KNEE ARTHROPLASTY Right 04/09/2019   Procedure: COMPUTER ASSISTED TOTAL KNEE ARTHROPLASTY;  Surgeon: Dereck Leep, MD;  Location: [facility] ORS;  Service: Orthopedics;  Laterality: Right;   KNEE CLOSED REDUCTION Left 04/15/2015   Procedure: CLOSED MANIPULATION KNEE;  Surgeon: Thornton Park, MD;  Location: [facility] ORS;  Service: Orthopedics;  Laterality: Left;   KNEE SURGERY     MAZE N/A 04/29/2020   Procedure: MAZE;  Surgeon: Rexene Alberts, MD;  Location: [place];  Service: Open Heart Surgery;  Laterality: N/A;   MITRAL VALVE REPLACEMENT N/A 04/29/2020   Procedure: MITRAL VALVE (MV) REPLACEMENT USING ON-X VALVE SIZE 27/29MM;  Surgeon: Rexene Alberts, MD;  Location: [place];  Service: Open Heart Surgery;  Laterality: N/A;   MULTIPLE EXTRACTIONS WITH ALVEOLOPLASTY N/A 02/28/2020   Procedure:  MULTIPLE EXTRACTION WITH ALVEOLOPLASTY;  Surgeon: [name], DMD;  Location: [place];  Service: Dentistry;  Laterality: N/A;   RIGHT/LEFT HEART CATH AND CORONARY ANGIOGRAPHY Bilateral 09/06/2019   Procedure: RIGHT/LEFT HEART CATH AND CORONARY ANGIOGRAPHY;  Surgeon: Minna Merritts, MD;  Location: [place] CV LAB;  Service: Cardiovascular;  Laterality: Bilateral;   TEE WITHOUT CARDIOVERSION N/A 09/13/2017   Procedure: TRANSESOPHAGEAL ECHOCARDIOGRAM (TEE);  Surgeon: Nelva Bush, MD;  Location: [facility] ORS;  Service: Cardiovascular;  Laterality: N/A;   TEE WITHOUT CARDIOVERSION N/A 04/01/2020   Procedure: TRANSESOPHAGEAL ECHOCARDIOGRAM (TEE);  Surgeon: [name], MD;  Location: [facility] ENDOSCOPY;  Service: Cardiovascular;  Laterality: N/A;   TEE WITHOUT CARDIOVERSION N/A 04/29/2020   Procedure: TRANSESOPHAGEAL ECHOCARDIOGRAM (TEE);  Surgeon: Rexene Alberts, MD;  Location: [place];  Service: Open Heart Surgery;  Laterality: N/A;   TOTAL KNEE ARTHROPLASTY Left 12/25/2014   Procedure: TOTAL KNEE ARTHROPLASTY;  Surgeon: Thornton Park, MD;  Location: [facility] ORS;  Service: Orthopedics;  Laterality: Left;   TUBAL LIGATION      Medical History: Past Medical History:  Diagnosis Date   (HFpEF) heart failure with preserved ejection fraction ([place])    a. 08/2017 Echo: EF 55-60%.  Grade 2 diastolic dysfunction; b. [id number] Echo: EF 50-55%, no rwma, Nl RV fxn, nl fxn'ing mech MV.   Allergy    Anemia    Anxiety    Asthma    BRCA negative 03/22/2013   Bronchitis 02/19/2016   ON LEVAQUIN PO   Chest tightness    a. 08/2019 Cath: nl cors.   Cigarette smoker 09/11/2017   8-10 day   Dysrhythmia    GERD (gastroesophageal reflux disease)    History of kidney stones    Hyperlipidemia    Hypertension    Interstitial lung disease ([place])    a. CT 2013 b. 02/2018 CXR noted recurrent intersistial changes ILD vs chronic bronchitis   Moderate mitral stenosis    a.  08/2017 TEE: EF 60 to 65%.  Moderate mitral stenosis.  Mean gradient 14 mmHg.  Valve area 2.59 cm by planimetry, 2.72 cm by pressure half-time.   PAF (paroxysmal atrial fibrillation) ([place])    a. 08/2017 s/p TEE/DCCV; b. CHA2DS2VASc = 2-->warfarin.   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 04/2020 s/p 27/29 mm Onyx mech mitral valve-->chronic coumadin; b. 05/2020 Echo: Nl fxn'ing mech MV.    Family History: Family History  Problem Relation Age of Onset   Cancer Mother 31       breast   Hypertension Mother    Cancer Maternal Aunt        breast   Cancer Maternal Grandmother        breast   Osteoarthritis Father    Hypertension Father     Social History    Socioeconomic History   Marital status: Married    Spouse name: Not on file   Number of children: Not on file   Years of education: Not on file   Highest education level: Not on file  Occupational History   Not on file  Tobacco Use   Smoking status: Former    Packs/day: 0.50    Years: 20.00    Additional pack years: 0.00    Total pack years: 10.00  Types: Cigarettes    Quit date: 01/2016    Years since quitting: 6.2   Smokeless tobacco: Never  Vaping Use   Vaping Use: Never used  Substance and Sexual Activity   Alcohol use: Yes    Comment: rarely   Drug use: No   Sexual activity: Not on file  Other Topics Concern   Not on file  Social History Narrative   Not on file   Social Determinants of Health   Financial Resource Strain: Low Risk  (05/07/2020)   Overall Financial Resource Strain (CARDIA)    Difficulty of Paying Living Expenses: Not very hard  Food Insecurity: Unknown (11/16/2021)   Hunger Vital Sign    Worried About Running Out of Food in the Last Year: Not on file    Ran Out of Food in the Last Year: Never true  Transportation Needs: No Transportation Needs (11/16/2021)   PRAPARE - Hydrologist (Medical): No    Lack of Transportation (Non-Medical): No  Physical Activity: Not on file  Stress: Not on file  Social Connections: Not on file  Intimate Partner Violence: Not At Risk (11/16/2021)   Humiliation, Afraid, Rape, and Kick questionnaire    Fear of Current or Ex-Partner: No    Emotionally Abused: No    Physically Abused: No    Sexually Abused: No      Review of Systems  Constitutional:  Negative for fatigue and fever.  HENT:  Negative for congestion, mouth sores and postnasal drip.   Respiratory:  Positive for shortness of breath. Negative for cough and wheezing.   Cardiovascular:  Negative for chest pain and leg swelling.  Gastrointestinal:  Positive for abdominal pain.  Genitourinary:  Positive for dysuria, flank  pain and frequency.  Musculoskeletal:  Positive for myalgias.       Muscle cramping  Psychiatric/Behavioral: Negative.      Vital Signs: BP 128/70   Pulse 60   Temp 98.4 F (36.9 C)   Resp 16   Ht 5\' 4"  (1.626 m)   Wt 247 lb 3.2 oz (112.1 kg)   SpO2 98%   BMI 42.43 kg/m    Physical Exam Vitals and nursing note reviewed.  Constitutional:      Appearance: Normal appearance. She is obese.  HENT:     Head: Normocephalic and atraumatic.  Eyes:     Extraocular Movements: Extraocular movements intact.  Cardiovascular:     Rate and Rhythm: Normal rate and regular rhythm.  Pulmonary:     Effort: Pulmonary effort is normal.     Breath sounds: Normal breath sounds.  Skin:    General: Skin is warm and dry.  Neurological:     General: No focal deficit present.     Mental Status: She is alert and oriented to person, place, and time.  Psychiatric:        Mood and Affect: Mood normal.        Behavior: Behavior normal.        Assessment/Plan: 1. Hypokalemia Will have labs drawn now and adjust doing as indicated. If new or worsening symptoms arise she will go to ED - Basic Metabolic Panel (BMET)  2. Type 2 diabetes mellitus with hyperglycemia, without long-term current use of insulin ([place]) Waiting on trulicity approval, PA already completed  3. Primary hypertension Stable, continue current medication  4. Urinary tract infection without hematuria, site unspecified - CULTURE, URINE COMPREHENSIVE  5. Dysuria - POCT Urinalysis Dipstick   General Counseling:  Alton verbalizes understanding of the findings of todays visit and agrees with plan of treatment. I have discussed any further diagnostic evaluation that may be needed or ordered today. We also reviewed her medications today. she has been encouraged to call the office with any questions or concerns that should arise related to todays visit.    Orders Placed This Encounter  Procedures   CULTURE, URINE COMPREHENSIVE    Basic Metabolic Panel (BMET)   POCT Urinalysis Dipstick    No orders of the defined types were placed in this encounter.   This patient was seen by Drema Dallas, PA-C in collaboration with Dr. Clayborn Bigness as a part of collaborative care agreement.   Total time spent:30 Minutes Time spent includes review of chart, medications, test results, and follow up plan with the patient.      Dr Lavera Guise Internal medicine

## 2022-04-06 ENCOUNTER — Telehealth: Payer: Self-pay

## 2022-04-06 ENCOUNTER — Other Ambulatory Visit: Payer: Self-pay

## 2022-04-06 LAB — URINE CULTURE: Culture: 40000 — AB

## 2022-04-06 MED ORDER — POTASSIUM CHLORIDE CRYS ER 20 MEQ PO TBCR: EXTENDED_RELEASE_TABLET | ORAL | 0 refills | Status: DC

## 2022-04-06 NOTE — Telephone Encounter (Signed)
-----   Message from [name], PA-C sent at 04/06/2022  1:09 PM EDT ----- Please let her know that her potassium level is actually still normal, though lower than it had been. If cramping is getting much worse then she may take extra tab of potassium 2 days per week and have labs rechecked when she goes to nephrology for further adjustment if needed, otherwise would continue current dosing if feeling ok and will see if dropping further on their labs and wait to let them adjust at that time.

## 2022-04-06 NOTE — Telephone Encounter (Signed)
Pt notified for labs that she will discuss with nephrology about her potassium to change

## 2022-04-07 ENCOUNTER — Other Ambulatory Visit: Payer: Self-pay

## 2022-04-07 ENCOUNTER — Telehealth: Payer: Self-pay

## 2022-04-07 DIAGNOSIS — N390 Urinary tract infection, site not specified: Secondary | ICD-10-CM

## 2022-04-07 MED ORDER — AMPICILLIN 500 MG PO CAPS: 500.0000 mg | ORAL_CAPSULE | Freq: Three times a day (TID) | ORAL | 0 refills | Status: DC

## 2022-04-07 NOTE — Telephone Encounter (Signed)
-----   Message from [name], PA-C sent at 04/07/2022  8:52 AM EDT ----- Urine culture still shows group B strep growth. Treat with another round of ampicillin 500mg  TID x5days (refill)

## 2022-04-07 NOTE — Telephone Encounter (Signed)
Pt notified and sent antibiotic

## 2022-04-08 LAB — CULTURE, URINE COMPREHENSIVE

## 2022-04-12 ENCOUNTER — Telehealth: Payer: Self-pay

## 2022-04-12 NOTE — Telephone Encounter (Signed)
Metformin is contraindicated for her due to her reduced kidney function, can we list this as justification to skip this step?

## 2022-04-16 ENCOUNTER — Other Ambulatory Visit: Payer: Self-pay

## 2022-04-16 MED ORDER — SPIRONOLACTONE 25 MG PO TABS: 25.0000 mg | ORAL_TABLET | Freq: Every day | ORAL | 3 refills | Status: DC

## 2022-04-20 ENCOUNTER — Ambulatory Visit: Payer: 59 | Admitting: Dermatology

## 2022-04-20 ENCOUNTER — Encounter: Payer: Self-pay | Admitting: Dermatology

## 2022-04-20 VITALS — BP 126/58 | HR 54

## 2022-04-20 DIAGNOSIS — Z79899 Other long term (current) drug therapy: Secondary | ICD-10-CM

## 2022-04-20 DIAGNOSIS — L910 Hypertrophic scar: Secondary | ICD-10-CM

## 2022-04-20 NOTE — Patient Instructions (Signed)
Due to recent changes in healthcare laws, you may see results of your pathology and/or laboratory studies on MyChart before the doctors have had a chance to review them. We understand that in some cases there may be results that are confusing or concerning to you. Please understand that not all results are received at the same time and often the doctors may need to interpret multiple results in order to provide you with the best plan of care or course of treatment. Therefore, we ask that you please give us 2 business days to thoroughly review all your results before contacting the office for clarification. Should we see a critical lab result, you will be contacted sooner.   If You Need Anything After Your Visit  If you have any questions or concerns for your doctor, please call our main line at [phone] and press option 4 to reach your doctor's medical assistant. If no one answers, please leave a voicemail as directed and we will return your call as soon as possible. Messages left after 4 pm will be answered the following business day.   You may also send us a message via MyChart. We typically respond to MyChart messages within 1-2 business days.  For prescription refills, please ask your pharmacy to contact our office. Our fax number is [phone].  If you have an urgent issue when the clinic is closed that cannot wait until the next business day, you can page your doctor at the number below.    Please note that while we do our best to be available for urgent issues outside of office hours, we are not available 24/7.   If you have an urgent issue and are unable to reach us, you may choose to seek medical care at your doctor's office, retail clinic, urgent care center, or emergency room.  If you have a medical emergency, please immediately call 911 or go to the emergency department.  Pager Numbers  - Dr. Kowalski: [phone]  - Dr. Moye: [phone]  - Dr. Stewart:  [phone]  In the event of inclement weather, please call our main line at [phone] for an update on the status of any delays or closures.  Dermatology Medication Tips: Please keep the boxes that topical medications come in in order to help keep track of the instructions about where and how to use these. Pharmacies typically print the medication instructions only on the boxes and not directly on the medication tubes.   If your medication is too expensive, please contact our office at [phone] option 4 or send us a message through MyChart.   We are unable to tell what your co-pay for medications will be in advance as this is different depending on your insurance coverage. However, we may be able to find a substitute medication at lower cost or fill out paperwork to get insurance to cover a needed medication.   If a prior authorization is required to get your medication covered by your insurance company, please allow us 1-2 business days to complete this process.  Drug prices often vary depending on where the prescription is filled and some pharmacies may offer cheaper prices.  The website www.goodrx.com contains coupons for medications through different pharmacies. The prices here do not account for what the cost may be with help from insurance (it may be cheaper with your insurance), but the website can give you the price if you did not use any insurance.  - You can print the associated coupon and take it with   your prescription to the pharmacy.  - You may also stop by our office during regular business hours and pick up a GoodRx coupon card.  - If you need your prescription sent electronically to a different pharmacy, notify our office through [facility] MyChart or by phone at [phone] option 4.     Si Usted Necesita Algo Despus de Su Visita  Tambin puede enviarnos un mensaje a travs de MyChart. Por lo general respondemos a los mensajes de MyChart en el transcurso de 1 a 2  das hbiles.  Para renovar recetas, por favor pida a su farmacia que se ponga en contacto con nuestra oficina. Nuestro nmero de fax es el [phone].  Si tiene un asunto urgente cuando la clnica est cerrada y que no puede esperar hasta el siguiente da hbil, puede llamar/localizar a su doctor(a) al nmero que aparece a continuacin.   Por favor, tenga en cuenta que aunque hacemos todo lo posible para estar disponibles para asuntos urgentes fuera del horario de oficina, no estamos disponibles las 24 horas del da, los 7 das de la semana.   Si tiene un problema urgente y no puede comunicarse con nosotros, puede optar por buscar atencin mdica  en el consultorio de su doctor(a), en una clnica privada, en un centro de atencin urgente o en una sala de emergencias.  Si tiene una emergencia mdica, por favor llame inmediatamente al 911 o vaya a la sala de emergencias.  Nmeros de bper  - Dr. Kowalski: [phone]  - Dra. Moye: [phone]  - Dra. Stewart: [phone]  En caso de inclemencias del tiempo, por favor llame a nuestra lnea principal al [phone] para una actualizacin sobre el estado de cualquier retraso o cierre.  Consejos para la medicacin en dermatologa: Por favor, guarde las cajas en las que vienen los medicamentos de uso tpico para ayudarle a seguir las instrucciones sobre dnde y cmo usarlos. Las farmacias generalmente imprimen las instrucciones del medicamento slo en las cajas y no directamente en los tubos del medicamento.   Si su medicamento es muy caro, por favor, pngase en contacto con nuestra oficina llamando al [phone] y presione la opcin 4 o envenos un mensaje a travs de MyChart.   No podemos decirle cul ser su copago por los medicamentos por adelantado ya que esto es diferente dependiendo de la cobertura de su seguro. Sin embargo, es posible que podamos encontrar un medicamento sustituto a menor costo o llenar un formulario para que el  seguro cubra el medicamento que se considera necesario.   Si se requiere una autorizacin previa para que su compaa de seguros cubra su medicamento, por favor permtanos de 1 a 2 das hbiles para completar este proceso.  Los precios de los medicamentos varan con frecuencia dependiendo del lugar de dnde se surte la receta y alguna farmacias pueden ofrecer precios ms baratos.  El sitio web www.goodrx.com tiene cupones para medicamentos de diferentes farmacias. Los precios aqu no tienen en cuenta lo que podra costar con la ayuda del seguro (puede ser ms barato con su seguro), pero el sitio web puede darle el precio si no utiliz ningn seguro.  - Puede imprimir el cupn correspondiente y llevarlo con su receta a la farmacia.  - Tambin puede pasar por nuestra oficina durante el horario de atencin regular y recoger una tarjeta de cupones de GoodRx.  - Si necesita que su receta se enve electrnicamente a una farmacia diferente, informe a nuestra oficina a travs de MyChart de [facility]   o por telfono llamando al [phone] y presione la opcin 4.  

## 2022-04-20 NOTE — Progress Notes (Signed)
   Follow-Up Visit   Subjective  Leslie [name] is a 57 y.o. female who presents for the following: Keloid - of the sternum, has improved since starting ILK injections here in the office.  The following portions of the chart were reviewed this encounter and updated as appropriate: medications, allergies, medical history  Review of Systems:  No other skin or systemic complaints except as noted in HPI or Assessment and Plan.  Objective  Well appearing patient in no apparent distress; mood and affect are within normal limits.  Areas Examined: The chest  Relevant physical exam findings are noted in the Assessment and Plan.   Assessment & Plan    KELOID Improving after first [place] Exam shows Firm pink/brown dermal papule(s)/plaque(s).  Treatment Plan: Continue ILK injections here in the office.   Location: chest/sternum, abdomen  Informed Consent: Discussed risks (infection, pain, bleeding, bruising, thinning of the skin, loss of skin pigment, lack of resolution, and recurrence of lesion) and benefits of the procedure, as well as the alternatives. Informed consent was obtained. Preparation: The area was prepared a standard fashion.  Anesthesia: none  Procedure Details: An intralesional injection was performed with Kenalog 5 mg/cc. 3.0 cc in total were injected. [place] [id number] Lot# [id number] Exp 10/2023 Total number of injections: >10  Plan: The patient was instructed on post-op care. Recommend OTC analgesia as needed for pain.  Return for 3-4 months keloid injections.  Luther Redo, CMA, am acting as scribe for Sarina Ser, MD .  I, Othelia Pulling, RMA, am acting as scribe for Sarina Ser, MD .  Documentation: I have reviewed the above documentation for accuracy and completeness, and I agree with the above.  Sarina Ser, MD

## 2022-04-25 NOTE — Progress Notes (Unsigned)
Date:  04/26/2022   ID:  Leslie [name], DOB 1965/11/11, MRN [id number]   PCP:  Carlean Jews, PA-C  Cardiologist:  Julien Nordmann, MD  Electrophysiologist:  None   Evaluation Performed:  Follow-Up Visit   Chief Complaint  Patient presents with   Shortness of Breath    Patient c/o cramping in feet and hands, shortness of breath with rapid irregular heart beats. Medications reviewed by the patient verbally.     History of Present Illness:    Leslie [name] is a 57 y.o. female with  mitral valve stenosis, s/p MVR, MAZE morbid obesity,  Snores, possible OSA anxiety,  hyperlipidemia,  Hospital admission 09/11/2017 for shortness of breath chest tightness in atrial fibrillation with RVR Had TEE  , Cardioversion Mild COPD, long hx of smoking Cardiac CTA Coronary calcium score of 10. Who presents for follow up of her paroxysmal atrial fibrillation, mitral valve stenosis, s/p mechanical MVR, PAF s/p maze,  LOV 1/24 3 Episodes of tachycardia/SOB in past few weeks Lasts few minutes, very symptomatic, feels like her atrial fibrillation again Was driving on one episode, had to pull over On each episode wonders if she should go to the emergency room as she is very symptomatic Typically will resolve without intervention In the setting of tachycardia has difficulty breathing  Chronic fatigue Never had sleep study before Concerned about expense for proper sleep study  Weight remains elevated, stable over the past year, 248 pounds  Compliant with her torsemide 20 twice daily Sometimes takes extra torsemide for ankle swelling  EKG personally reviewed by myself on todays visit Sinus bradycardia rate 55 bpm no significant ST-T wave changes  Other past medical history reviewed 04/29/2020 she underwent median sternotomy, mitral valve replacement, MAZE.   Coumadin was initiated due to mechanical MVR  Echo July 2023 Normal EF, stable mechanical mitral valve Right heart  pressures estimated 42, ,  mitral valve mean gradient 4 mmHg  Flu 12/23, treated with tamiflu And follow-up she reports that she is sick again, in bed 4 days with viral symptoms Reports having cramping, entire body: comes and goes  H/A, started on on ABX for sinus infection, doxycycline, started in the past day or so  Taking mucinex and tylenol  Out on disability for her cardiac issues   left upper extremity ulnar nerve surgery done on 8/15 by Dr. Gilman Schmidt complicated by postop bleeding  Dressing change in office on 8/19 showed bleeding so she was sent to ED for ongoing surgical site bleeding Hemoglobin dropped to 7.7 S/p 1 unit PRBC  heparin bridge while in the hospital  Underwent repeat surgery for hematoma in September 2022, off warfarin, had Lovenox bridge Reports still has some numbness in her hand, has not started PT yet  transesophageal echocardiogram 04/01/2020-confirming rheumatic heart disease with severe mitral stenosis.    On trosemide 40/20 daily (3 a day)  Echo 05/2020 Close to normal cardiac function Prosthetic mitral valve working well  Echocardiogram August 2020  mitral valve Moderate thickening of the mitral valve leaflet. Moderate-severe mitral valve stenosis, with mean gradient of 16 mmHg and MVA by continuity equation of 1.0 cm^2.    Transesophageal echo August 2019 with moderate mitral valve stenosis mean gradient of 14 Mobility of the posterior leaflet was restricted.    summer 2020 developed right flank pain,   underwent right ureteroscopy, laser lithotripsy, and stent placement on 08/18/2018.  Had pyelonephritis post procedure  Creatinine trended up  to 4.1  acute kidney injury and sepsis in the setting of recent right ureteral stone status post lithotripsy and likely pyelonephritis.   atrial fibrillation December 2019 01/05/2018 awoke suddenly with tachypalpitations  rates in the 130s   EMS was called, noted to be in sinus rhythm with heart rates  in the 80s to 90s bpm.     Cardiac CTA 1. Coronary calcium score of 10. This was 41 percentile for age and sex matched control.. Normal coronary origin with right dominance.Mild non-obstructive CAD.  Past Medical History:  Diagnosis Date   (HFpEF) heart failure with preserved ejection fraction    a. 08/2017 Echo: EF 55-60%.  Grade 2 diastolic dysfunction; b. 05/2020 Echo: EF 50-55%, no rwma, Nl RV fxn, nl fxn'ing mech MV.   Allergy    Anemia    Anxiety    Asthma    BRCA negative 03/22/2013   Bronchitis 02/19/2016   ON LEVAQUIN PO   Chest tightness    a. 08/2019 Cath: nl cors.   Cigarette smoker 09/11/2017   8-10 day   Dysrhythmia    GERD (gastroesophageal reflux disease)    History of kidney stones    Hyperlipidemia    Hypertension    Interstitial lung disease    a. CT 2013 b. 02/2018 CXR noted recurrent intersistial changes ILD vs chronic bronchitis   Moderate mitral stenosis    a.  08/2017 TEE: EF 60 to 65%.  Moderate mitral stenosis.  Mean gradient 14 mmHg.  Valve area 2.59 cm by planimetry, 2.72 cm by pressure half-time.   PAF (paroxysmal atrial fibrillation)    a. 08/2017 s/p TEE/DCCV; b. CHA2DS2VASc = 2-->warfarin.   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 04/2020 s/p 27/29 mm Onyx mech mitral valve-->chronic coumadin; b. 05/2020 Echo: Nl fxn'ing mech MV.   Past Surgical History:  Procedure Laterality Date   ABDOMINAL HYSTERECTOMY     total   ABDOMINAL HYSTERECTOMY     BREAST BIOPSY Bilateral 2012   BREAST BIOPSY Right 08-07-12   fibroadenomatous changes and columnar cells   BREAST BIOPSY  02/03/2015   stereo byrnett   BUBBLE STUDY  04/01/2020   Procedure: BUBBLE STUDY;  Surgeon: Parke Poisson, MD;  Location: [facility] END[facility];  Service:  Cardiovascular;;   CARDIAC CATHETERIZATION     CHOLECYSTECTOMY N/A 02/27/2016   Procedure: LAPAROSCOPIC CHOLECYSTECTOMY;  Surgeon: Kieth Brightly, MD;  Location: [facility] ORS;  Service: General;  Laterality: N/A;   CLIPPING OF ATRIAL APPENDAGE  04/29/2020   Procedure: CLIPPING OF ATRIAL APPENDAGE USING ATRICURE  PRO2 CLIP SIZE ;  Surgeon: Purcell Nails, MD;  Location: [facility] OR;  Service: Open Heart Surgery;;   COLONOSCOPY WITH PROPOFOL N/A 09/26/2018   Procedure: COLONOSCOPY WITH PROPOFOL;  Surgeon: Midge Minium, MD;  Location: [facility] ENDOSCOPY;  Service: Endoscopy;  Laterality: N/A;   COLONOSCOPY WITH PROPOFOL N/A 07/16/2021   Procedure: COLONOSCOPY WITH PROPOFOL;  Surgeon: Midge Minium, MD;  Location: [facility] ENDOSCOPY;  Service: Endoscopy;  Laterality: N/A;   CYSTOSCOPY W/ RETROGRADES Right 08/18/2018   Procedure: CYSTOSCOPY WITH RETROGRADE PYELOGRAM;  Surgeon: Sondra Come, MD;  Location: [facility] ORS;  Service: Urology;  Laterality: Right;   CYSTOSCOPY/URETEROSCOPY/HOLMIUM LASER/STENT PLACEMENT Right 08/18/2018   Procedure: CYSTOSCOPY/URETEROSCOPY/STENT PLACEMENT;  Surgeon: Sondra Come, MD;  Location: [facility] ORS;  Service: Urology;  Laterality: Right;   DIAGNOSTIC LAPAROSCOPY     ESOPHAGOGASTRODUODENOSCOPY N/A 07/16/2021   Procedure: ESOPHAGOGASTRODUODENOSCOPY (EGD);  Surgeon: Midge MiniumWohl, Darren, MD;  [facility];  Service: Endoscopy;  Laterality: N/A;   ESOPHAGOGASTRODUODENOSCOPY (EGD) WITH PROPOFOL N/A 09/26/2018   Procedure: ESOPHAGOGASTRODUODENOSCOPY (EGD) WITH PROPOFOL;  Surgeon: Midge MiniumWohl, Darren, MD;  Location: [facility] ENDOSCOPY;  Service: Endoscopy;  Laterality: N/A;   GIVENS CAPSULE STUDY N/A 11/03/2018   Procedure: GIVENS CAPSULE STUDY;  Surgeon: Midge MiniumWohl, Darren, MD;  [facility];  Service: Endoscopy;  Laterality: N/A;   JOINT REPLACEMENT Left    knee   KNEE ARTHROPLASTY Right 04/09/2019   Procedure: COMPUTER ASSISTED TOTAL KNEE ARTHROPLASTY;  Surgeon: Donato HeinzHooten, James P, MD;   Location: [facility] ORS;  Service: Orthopedics;  Laterality: Right;   KNEE CLOSED REDUCTION Left 04/15/2015   Procedure: CLOSED MANIPULATION KNEE;  Surgeon: Juanell FairlyKevin Krasinski, MD;  Location: [facility] ORS;  Service: Orthopedics;  Laterality: Left;   KNEE SURGERY     MAZE N/A 04/29/2020   Procedure: MAZE;  Surgeon: Purcell Nailswen, Clarence H, MD;  [facility];  Service: Open Heart Surgery;  Laterality: N/A;   MITRAL VALVE REPLACEMENT N/A 04/29/2020   Procedure: MITRAL VALVE (MV) REPLACEMENT USING ON-X VALVE SIZE 27/29MM;  Surgeon: Purcell Nailswen, Clarence H, MD;  [facility];  Service: Open Heart Surgery;  Laterality: N/A;   MULTIPLE EXTRACTIONS WITH ALVEOLOPLASTY N/A 02/28/2020   Procedure: MULTIPLE EXTRACTION WITH ALVEOLOPLASTY;  Surgeon: Sharman Cheekwsley, Madison B, DMD;  Location: [facility] OR;  Service: Dentistry;  Laterality: N/A;   RIGHT/LEFT HEART CATH AND CORONARY ANGIOGRAPHY Bilateral 09/06/2019   Procedure: RIGHT/LEFT HEART CATH AND CORONARY ANGIOGRAPHY;  Surgeon: Antonieta IbaGollan, Elhadji Pecore J, MD;  Location: [facility] INVASIVE CV LAB;  Service: Cardiovascular;  Laterality: Bilateral;   TEE WITHOUT CARDIOVERSION N/A 09/13/2017   Procedure: TRANSESOPHAGEAL ECHOCARDIOGRAM (TEE);  Surgeon: Yvonne KendallEnd, Christopher, MD;  Location: [facility] ORS;  Service: Cardiovascular;  Laterality: N/A;   TEE WITHOUT CARDIOVERSION N/A 04/01/2020   Procedure: TRANSESOPHAGEAL ECHOCARDIOGRAM (TEE);  Surgeon: Parke PoissonAcharya, Gayatri A, MD;  [facility];  Service: Cardiovascular;  Laterality: N/A;   TEE WITHOUT CARDIOVERSION N/A 04/29/2020   Procedure: TRANSESOPHAGEAL ECHOCARDIOGRAM (TEE);  Surgeon: Purcell Nailswen, Clarence H, MD;  [facility];  Service: Open Heart Surgery;  Laterality: N/A;   TOTAL KNEE ARTHROPLASTY Left 12/25/2014   Procedure: TOTAL KNEE ARTHROPLASTY;  Surgeon: Juanell FairlyKevin Krasinski, MD;  Location: [facility] ORS;  Service: Orthopedics;  Laterality: Left;   TUBAL LIGATION       Current Meds  Medication Sig   albuterol (VENTOLIN HFA) 108 (90 Base) MCG/ACT inhaler INHALE 2 PUFFS  INTO THE LUNGS EVERY 6 HOURS AS NEEDED FOR WHEEZING OR SHORTNESS OF BREATH   ALPRAZolam (XANAX) 0.5 MG tablet TAKE 1/2 TABLET BY MOUTH AT BEDTIME AS NEEDED FOR ANXIETY   amLODipine (NORVASC) 5 MG tablet Take 5 mg by mouth daily.   aspirin 81 MG EC tablet Take 81 mg by mouth daily.   atorvastatin (LIPITOR) 80 MG tablet Take 1 tablet (80 mg total) by mouth daily.   calcium carbonate (TUMS - DOSED IN MG ELEMENTAL CALCIUM) 500 MG chewable tablet Chew 500 mg by mouth daily as needed for indigestion or heartburn.   losartan (COZAAR) 50 MG tablet Take 50 mg by mouth daily.   metoprolol succinate (TOPROL-XL) 100 MG 24 hr tablet TAKE 1 TABLET(100 MG) BY MOUTH TWICE DAILY WITH OR IMMEDIATELY FOLLOWING A MEAL   omeprazole (PRILOSEC) 40 MG capsule TAKE ONE CAPSULE BY MOUTH DAILY   potassium chloride  SA (KLOR-CON M) 20 MEQ tablet Take 2 tabs  twice a day   spironolactone (ALDACTONE) 25 MG tablet Take 1 tablet (25 mg total) by mouth daily.   torsemide (DEMADEX) 20 MG tablet Take 1 tablet (20 mg total) by mouth 2 (two) times daily.   warfarin (COUMADIN) 2 MG tablet TAKE 1/2 TO 1 TABLET BY MOUTH DAILY AS DIRECTED BY THE ANTI-COAG CLINIC (Patient taking differently: Take 1-2 mg by mouth See admin instructions. Take 2 mg every day every day except on Sunday Thursday take 1 mg per patient)     Allergies:   Morphine, Oxycodone hcl, and Dilaudid [hydromorphone hcl]   Social History   Tobacco Use   Smoking status: Former    Packs/day: 0.50    Years: 20.00    Additional pack years: 0.00    Total pack years: 10.00    Types: Cigarettes    Quit date: 01/2016    Years since quitting: 6.2   Smokeless tobacco: Never  Vaping Use   Vaping Use: Never used  Substance Use Topics   Alcohol use: Yes    Comment: rarely   Drug use: No     Family Hx: The patient's family history includes Cancer in her maternal aunt and maternal grandmother; Cancer (age of onset: 73) in her mother; Hypertension in her father and  mother; Osteoarthritis in her father.  ROS:   Please see the history of present illness.    Review of Systems  Constitutional: Negative.   HENT: Negative.    Respiratory:  Positive for shortness of breath.   Cardiovascular:  Positive for palpitations.  Gastrointestinal: Negative.   Musculoskeletal: Negative.   Neurological: Negative.   Psychiatric/Behavioral: Negative.    All other systems reviewed and are negative.   Labs/Other Tests and Data Reviewed:     Recent Labs: 06/26/2021: TSH 0.658 11/16/2021: ALT 41 12/14/2021: Magnesium 1.8 01/12/2022: B Natriuretic Peptide 83.7; Hemoglobin 11.8; Platelets 304 04/05/2022: BUN 31; Creatinine, Ser 1.98; Potassium 3.8; Sodium 140   Recent Lipid Panel Lab Results  Component Value Date/Time   CHOL 170 06/26/2021 09:12 AM   TRIG 160 (H) 06/26/2021 09:12 AM   HDL 36 (L) 06/26/2021 09:12 AM   CHOLHDL 5.2 09/19/2018 09:32 AM   LDLCALC 106 (H) 06/26/2021 09:12 AM    Wt Readings from Last 3 Encounters:  04/26/22 247 lb 8 oz (112.3 kg)  04/05/22 247 lb 3.2 oz (112.1 kg)  03/15/22 247 lb 6.4 oz (112.2 kg)     Objective:    Vital Signs:  BP (!) 138/50 (BP Location: Left Arm, Patient Position: Sitting, Cuff Size: Large)   Pulse (!) 55   Ht 5\' 4"  (1.626 m)   Wt 247 lb 8 oz (112.3 kg)   SpO2 98%   BMI 42.48 kg/m   Constitutional:  oriented to person, place, and time. No distress.  Obese HENT:  Head: Grossly normal Eyes:  no discharge. No scleral icterus.  Neck: No JVD, no carotid bruits  Cardiovascular: Regular rate and rhythm, no murmurs appreciated Pulmonary/Chest: Clear to auscultation bilaterally, no wheezes or rails Abdominal: Soft.  no distension.  no tenderness.  Musculoskeletal: Normal range of motion Neurological:  normal muscle tone. Coordination normal. No atrophy Skin: Skin warm and dry Psychiatric: normal affect, pleasant   ASSESSMENT & PLAN:    Mitral valve stenosis moderate pulmonary hypertension S/p  replacement Echocardiogram July 2023 with mild to moderately elevated right heart pressures Appears euvolemic, continue torsemide 20 twice daily, on warfarin  Chronic diastolic CHF Ejection fraction estimated 60 to 65% on echo July 2023 Will continue torsemide 20 twice daily with spironolactone Symptoms exacerbated by obesity, underlying mitral valve disease   Paroxysmal atrial fibrillation Recurrent symptoms on today's visit Reports compliance with her metoprolol succinate 100 twice daily Recommend she start Multaq 400 twice daily We will try to avoid amiodarone given young age  Morbid obesity We have encouraged continued exercise, careful diet management in an effort to lose weight.    Total encounter time more than 30 minutes  Greater than 50% was spent in counseling and coordination of care with the patient   Signed, Julien Nordmann, MD  04/26/2022 1:39 PM    [facility]

## 2022-04-26 ENCOUNTER — Ambulatory Visit: Payer: 59 | Attending: Cardiovascular Disease | Admitting: Cardiovascular Disease

## 2022-04-26 ENCOUNTER — Encounter: Payer: Self-pay | Admitting: Internal Medicine

## 2022-04-26 ENCOUNTER — Encounter: Payer: Self-pay | Admitting: Cardiovascular Disease

## 2022-04-26 VITALS — BP 138/50 | HR 55 | Ht 64.0 in | Wt 247.5 lb

## 2022-04-26 DIAGNOSIS — Z954 Presence of other heart-valve replacement: Secondary | ICD-10-CM

## 2022-04-26 DIAGNOSIS — I10 Essential (primary) hypertension: Secondary | ICD-10-CM

## 2022-04-26 DIAGNOSIS — I050 Rheumatic mitral stenosis: Secondary | ICD-10-CM

## 2022-04-26 DIAGNOSIS — I4891 Unspecified atrial fibrillation: Secondary | ICD-10-CM

## 2022-04-26 DIAGNOSIS — Z952 Presence of prosthetic heart valve: Secondary | ICD-10-CM

## 2022-04-26 DIAGNOSIS — I5032 Chronic diastolic (congestive) heart failure: Secondary | ICD-10-CM | POA: Diagnosis not present

## 2022-04-26 MED ORDER — MULTAQ 400 MG PO TABS: 400.0000 mg | ORAL_TABLET | Freq: Two times a day (BID) | ORAL | 3 refills | Status: DC

## 2022-04-26 MED ORDER — DILTIAZEM HCL 60 MG PO TABS: 60.0000 mg | ORAL_TABLET | Freq: Three times a day (TID) | ORAL | 2 refills | Status: DC | PRN

## 2022-04-26 NOTE — Patient Instructions (Addendum)
Medication Instructions:  Please start multaq 400 mg twice a day for atrial fibrillation  As needed diltiazem 60 mg as needed for tachycardia  If you need a refill on your cardiac medications before your next appointment, please call your pharmacy.   Lab work: No new labs needed  Testing/Procedures: No new testing needed  Follow-Up: At [facility], you and your health needs are our priority.  As part of our continuing mission to provide you with exceptional heart care, we have created designated Provider Care Teams.  These Care Teams include your primary Cardiologist (physician) and Advanced Practice Providers (APPs -  Physician Assistants and Nurse Practitioners) who all work together to provide you with the care you need, when you need it.  You will need a follow up appointment in 6 months  Providers on your designated Care Team:   Nicolasa Ducking, NP Eula Listen, PA-C Cadence Fransico Michael, PA-C  Ask insurance about the [facility] system from [place]

## 2022-04-28 ENCOUNTER — Other Ambulatory Visit: Payer: Self-pay | Admitting: Physician Assistant

## 2022-04-28 ENCOUNTER — Telehealth: Payer: Self-pay | Admitting: Cardiovascular Disease

## 2022-04-28 NOTE — Telephone Encounter (Signed)
Pt c/o medication issue:  1. Name of Medication: dronedarone (MULTAQ) 400 MG tablet   potassium chloride SA (KLOR-CON M) 20 MEQ tablet    2. How are you currently taking this medication (dosage and times per day)? As written  3. Are you having a reaction (difficulty breathing--STAT)? No  4. What is your medication issue? Pt stated that her insurance is not approving these medications. Patient was told if she has issues with the insurance approving the medication to give [place] a call. Please advise

## 2022-04-28 NOTE — Telephone Encounter (Signed)
Spoke with Anette Riedel at [facility] who states she is trying to fill the potassium too soon. She can not get that refilled until 05/03/22. Multaq needs a prior authorization.  Thank you!

## 2022-04-29 ENCOUNTER — Other Ambulatory Visit: Payer: Self-pay | Admitting: Physician Assistant

## 2022-04-29 DIAGNOSIS — E1165 Type 2 diabetes mellitus with hyperglycemia: Secondary | ICD-10-CM

## 2022-04-29 MED ORDER — VICTOZA 18 MG/3ML ~~LOC~~ SOPN: PEN_INJECTOR | SUBCUTANEOUS | 2 refills | Status: DC

## 2022-04-30 ENCOUNTER — Other Ambulatory Visit (HOSPITAL_COMMUNITY): Payer: Self-pay

## 2022-04-30 ENCOUNTER — Telehealth: Payer: Self-pay

## 2022-04-30 NOTE — Telephone Encounter (Signed)
Spoke with patient and informed her of approval.

## 2022-04-30 NOTE — Telephone Encounter (Signed)
Completed P.A. for patient's Victoza. 

## 2022-04-30 NOTE — Telephone Encounter (Signed)
Pharmacy Patient Advocate Encounter  Prior Authorization for Karie Soda has been approved.    PA# [id number] Effective dates: 04/30/22 through 04/30/23   Received notification from [facility] that prior authorization for MULTAQ is needed.    PA submitted on 04/30/22 Key [id number] Status is pending  Haze Rushing, CPhT Pharmacy Patient Advocate Specialist Direct Number: [phone] Fax: [phone]

## 2022-05-03 ENCOUNTER — Telehealth: Payer: Self-pay

## 2022-05-03 NOTE — Telephone Encounter (Signed)
Patient was approved for Victoza.

## 2022-05-04 ENCOUNTER — Other Ambulatory Visit: Payer: Self-pay | Admitting: Physician Assistant

## 2022-05-04 ENCOUNTER — Telehealth: Payer: Self-pay

## 2022-05-04 DIAGNOSIS — E1165 Type 2 diabetes mellitus with hyperglycemia: Secondary | ICD-10-CM

## 2022-05-04 MED ORDER — INSULIN PEN NEEDLE 32G X 6 MM MISC: 3 refills | Status: DC

## 2022-05-05 ENCOUNTER — Ambulatory Visit: Payer: 59 | Attending: Cardiovascular Disease | Admitting: *Deleted

## 2022-05-05 DIAGNOSIS — I050 Rheumatic mitral stenosis: Secondary | ICD-10-CM | POA: Diagnosis not present

## 2022-05-05 DIAGNOSIS — I4891 Unspecified atrial fibrillation: Secondary | ICD-10-CM

## 2022-05-05 DIAGNOSIS — Z5181 Encounter for therapeutic drug level monitoring: Secondary | ICD-10-CM

## 2022-05-05 LAB — POCT INR: INR: 2.1 (ref 2.0–3.0)

## 2022-05-05 NOTE — Patient Instructions (Signed)
Increase warfarin to 1 tablet daily except 1/2 tablet on Mondays and Fridays - Recheck INR in 4 weeks

## 2022-05-06 ENCOUNTER — Ambulatory Visit (INDEPENDENT_AMBULATORY_CARE_PROVIDER_SITE_OTHER): Payer: Medicaid Other | Admitting: Physician Assistant

## 2022-05-06 ENCOUNTER — Other Ambulatory Visit
Admission: RE | Admit: 2022-05-06 | Discharge: 2022-05-06 | Disposition: A | Discharge status: Home / Self Care | Payer: 59 | Attending: Physician Assistant | Admitting: Physician Assistant

## 2022-05-06 ENCOUNTER — Encounter: Payer: Self-pay | Admitting: Physician Assistant

## 2022-05-06 VITALS — BP 143/76 | HR 58 | Temp 98.3°F | Resp 16 | Ht 64.0 in | Wt 247.6 lb

## 2022-05-06 DIAGNOSIS — R300 Dysuria: Secondary | ICD-10-CM

## 2022-05-06 DIAGNOSIS — R319 Hematuria, unspecified: Secondary | ICD-10-CM

## 2022-05-06 DIAGNOSIS — R1013 Epigastric pain: Secondary | ICD-10-CM | POA: Insufficient documentation

## 2022-05-06 DIAGNOSIS — E876 Hypokalemia: Secondary | ICD-10-CM | POA: Diagnosis not present

## 2022-05-06 DIAGNOSIS — N390 Urinary tract infection, site not specified: Secondary | ICD-10-CM | POA: Diagnosis not present

## 2022-05-06 LAB — COMPREHENSIVE METABOLIC PANEL
ALT: 28 U/L (ref 0–44)
AST: 25 U/L (ref 15–41)
Albumin: 4.2 g/dL (ref 3.5–5.0)
Alkaline Phosphatase: 117 U/L (ref 38–126)
Anion gap: 11 (ref 5–15)
BUN: 41 mg/dL — ABNORMAL HIGH (ref 6–20)
CO2: 27 mmol/L (ref 22–32)
Calcium: 9.2 mg/dL (ref 8.9–10.3)
Chloride: 99 mmol/L (ref 98–111)
Creatinine, Ser: 2.09 mg/dL — ABNORMAL HIGH (ref 0.44–1.00)
GFR, Estimated: 27 mL/min — ABNORMAL LOW (ref >60)
Glucose, Bld: 111 mg/dL — ABNORMAL HIGH (ref 70–99)
Potassium: 3.7 mmol/L (ref 3.5–5.1)
Sodium: 137 mmol/L (ref 135–145)
Total Bilirubin: 0.5 mg/dL (ref 0.3–1.2)
Total Protein: 8.2 g/dL — ABNORMAL HIGH (ref 6.5–8.1)

## 2022-05-06 LAB — POCT URINALYSIS DIPSTICK
Bilirubin, UA: NEGATIVE
Glucose, UA: NEGATIVE
Ketones, UA: NEGATIVE
Leukocytes, UA: NEGATIVE
Nitrite, UA: NEGATIVE
Protein, UA: NEGATIVE
Spec Grav, UA: 1.01 (ref 1.010–1.025)
Urobilinogen, UA: 0.2 E.U./dL
pH, UA: 5 (ref 5.0–8.0)

## 2022-05-06 LAB — CBC WITH DIFFERENTIAL/PLATELET
Abs Immature Granulocytes: 0.02 10*3/uL (ref 0.00–0.07)
Basophils Absolute: 0 10*3/uL (ref 0.0–0.1)
Basophils Relative: 1 %
Eosinophils Absolute: 0.1 10*3/uL (ref 0.0–0.5)
Eosinophils Relative: 1 %
HCT: 36.1 % (ref 36.0–46.0)
Hemoglobin: 11.7 g/dL — ABNORMAL LOW (ref 12.0–15.0)
Immature Granulocytes: 0 %
Lymphocytes Relative: 31 %
Lymphs Abs: 2 10*3/uL (ref 0.7–4.0)
MCH: 29.3 pg (ref 26.0–34.0)
MCHC: 32.4 g/dL (ref 30.0–36.0)
MCV: 90.5 fL (ref 80.0–100.0)
Monocytes Absolute: 0.6 10*3/uL (ref 0.1–1.0)
Monocytes Relative: 9 %
Neutro Abs: 3.7 10*3/uL (ref 1.7–7.7)
Neutrophils Relative %: 58 %
Platelets: 315 10*3/uL (ref 150–400)
RBC: 3.99 MIL/uL (ref 3.87–5.11)
RDW: 12 % (ref 11.5–15.5)
WBC: 6.4 10*3/uL (ref 4.0–10.5)
nRBC: 0 % (ref 0.0–0.2)

## 2022-05-06 LAB — LIPASE, BLOOD: Lipase: 28 U/L (ref 11–51)

## 2022-05-06 MED ORDER — POTASSIUM CHLORIDE CRYS ER 20 MEQ PO TBCR: EXTENDED_RELEASE_TABLET | ORAL | 0 refills | Status: DC

## 2022-05-06 NOTE — Telephone Encounter (Signed)
Done

## 2022-05-06 NOTE — Progress Notes (Signed)
[facility] [address] [place], [place] [address]  Internal MEDICINE  Office Visit Note  Patient Name: Leslie [name]  [id number]  [id number]  Date of Service: 05/06/2022  Chief Complaint  Patient presents with   Urinary Tract Infection    Lower back pain, burning when urinating, fatigue   Abdominal Pain    Bloating, cramping, diarrhea, nausea     HPI Pt is here for a sick visit. -Symptoms started about 1 month ago.  -Intermittent bloating, gas, nausea, had some diarrhea but this stopped. A little more SOB. Some abdominal pain in the middle of stomach -Some urinary symptoms still, burning, itching. Has been treated for UTI of group B strep with ampicillin previously. -Nephrology has her taking 2tabs potassium BID. Will need to check labs and send potassium pending results -She had a colonoscopy last year when similar symptoms occurred and no acute findings. -She also was diagnosed with a kidney stone previously when having similar symptoms, but on follow up with urology was told may not be a stone and has not had follow up  Current Medication:  Outpatient Encounter Medications as of 05/06/2022  Medication Sig   albuterol (VENTOLIN HFA) 108 (90 Base) MCG/ACT inhaler INHALE 2 PUFFS INTO THE LUNGS EVERY 6 HOURS AS NEEDED FOR WHEEZING OR SHORTNESS OF BREATH   ALPRAZolam (XANAX) 0.5 MG tablet TAKE 1/2 TABLET BY MOUTH AT BEDTIME AS NEEDED FOR ANXIETY   amLODipine (NORVASC) 5 MG tablet Take 5 mg by mouth daily.   aspirin 81 MG EC tablet Take 81 mg by mouth daily.   atorvastatin (LIPITOR) 80 MG tablet Take 1 tablet (80 mg total) by mouth daily.   calcium carbonate (TUMS - DOSED IN MG ELEMENTAL CALCIUM) 500 MG chewable tablet Chew 500 mg by mouth daily as needed for indigestion or heartburn.   chlorpheniramine-HYDROcodone (TUSSIONEX) 10-8 MG/5ML Take 5 mLs by mouth every 12 (twelve) hours as needed.   diltiazem (CARDIZEM) 60 MG tablet Take 1 tablet (60 mg total) by mouth  3 (three) times daily as needed (for tachycardia).   dronedarone (MULTAQ) 400 MG tablet Take 1 tablet (400 mg total) by mouth 2 (two) times daily with a meal.   ferrous fumarate-b12-vitamic C-folic acid (FEROCON) capsule TAKE 1 CAPSULE BY MOUTH TWICE A DAY AFTER MEALS   ferrous sulfate 325 (65 FE) MG EC tablet Take 325 mg by mouth in the morning and at bedtime.   Insulin Pen Needle 32G X 6 MM MISC Use with victoza   liraglutide (VICTOZA) 18 MG/3ML SOPN Start 0.6mg  SQ once a day for 7 days, then increase to 1.2mg  once a day   losartan (COZAAR) 50 MG tablet Take 50 mg by mouth daily.   metoprolol succinate (TOPROL-XL) 100 MG 24 hr tablet TAKE 1 TABLET(100 MG) BY MOUTH TWICE DAILY WITH OR IMMEDIATELY FOLLOWING A MEAL   omeprazole (PRILOSEC) 40 MG capsule TAKE ONE CAPSULE BY MOUTH DAILY   potassium chloride SA (KLOR-CON M) 20 MEQ tablet Take 2 tabs  twice a day   spironolactone (ALDACTONE) 25 MG tablet Take 1 tablet (25 mg total) by mouth daily.   sucralfate (CARAFATE) 1 g tablet Take 1 tablet (1 g total) by mouth 4 (four) times daily -  with meals and at bedtime.   torsemide (DEMADEX) 20 MG tablet Take 1 tablet (20 mg total) by mouth 2 (two) times daily.   warfarin (COUMADIN) 2 MG tablet TAKE 1/2 TO 1 TABLET BY MOUTH DAILY AS DIRECTED BY THE ANTI-COAG  CLINIC (Patient taking differently: Take 1-2 mg by mouth See admin instructions. Take 2 mg every day every day except on Sunday Thursday take 1 mg per patient)   ampicillin (PRINCIPEN) 500 MG capsule Take 1 capsule (500 mg total) by mouth 3 (three) times daily. (Patient not taking: Reported on 05/06/2022)   No facility-administered encounter medications on file as of 05/06/2022.      Medical History: Past Medical History:  Diagnosis Date   (HFpEF) heart failure with preserved ejection fraction    a. 08/2017 Echo: EF 55-60%.  Grade 2 diastolic dysfunction; b. 05/2020 Echo: EF 50-55%, no rwma, Nl RV fxn, nl fxn'ing mech MV.   Allergy    Anemia     Anxiety    Asthma    BRCA negative 03/22/2013   Bronchitis 02/19/2016   ON LEVAQUIN PO   Chest tightness    a. 08/2019 Cath: nl cors.   Cigarette smoker 09/11/2017   8-10 day   Dysrhythmia    GERD (gastroesophageal reflux disease)    History of kidney stones    Hyperlipidemia    Hypertension    Interstitial lung disease    a. CT 2013 b. 02/2018 CXR noted recurrent intersistial changes ILD vs chronic bronchitis   Moderate mitral stenosis    a.  08/2017 TEE: EF 60 to 65%.  Moderate mitral stenosis.  Mean gradient 14 mmHg.  Valve area 2.59 cm by planimetry, 2.72 cm by pressure half-time.   PAF (paroxysmal atrial fibrillation)    a. 08/2017 s/p TEE/DCCV; b. CHA2DS2VASc = 2-->warfarin.   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 04/2020 s/p 27/29 mm Onyx mech mitral valve-->chronic coumadin; b. 05/2020 Echo: Nl fxn'ing mech MV.     Vital Signs: BP (!) 143/76   Pulse (!) 58   Temp 98.3 F (36.8 C)   Resp 16   Ht  (1.626 m)   Wt 247 lb 9.6 oz (112.3 kg)   SpO2 97%   BMI 42.50 kg/m    Review of Systems  Constitutional:  Negative for fatigue and fever.  HENT:  Negative for congestion, mouth sores and postnasal drip.   Respiratory:  Negative for cough.   Cardiovascular:  Negative for chest pain.  Gastrointestinal:  Positive for abdominal distention, abdominal pain, diarrhea and nausea.  Genitourinary:  Positive for dysuria, flank pain and frequency.  Psychiatric/Behavioral: Negative.      Physical Exam Vitals and nursing note reviewed.  Constitutional:      Appearance: Normal appearance. She is obese.  HENT:     Head: Normocephalic and atraumatic.  Eyes:     Extraocular Movements: Extraocular movements intact.  Cardiovascular:     Rate and  Rhythm: Normal rate and regular rhythm.  Pulmonary:     Effort: Pulmonary effort is normal.     Breath sounds: Normal breath sounds.  Abdominal:     General: Bowel sounds are normal.     Tenderness: There is abdominal tenderness in the epigastric area. There is no right CVA tenderness, left CVA tenderness or guarding.  Skin:    General: Skin is warm and dry.  Neurological:     General: No focal deficit present.     Mental Status: She is alert and oriented to person, place, and time.  Psychiatric:  Mood and Affect: Mood normal.        Behavior: Behavior normal.       Assessment/Plan: 1. Epigastric pain Will start by checking labs, may consider empiric ABX treatment and bland diet. Advised to go to ED if new or worsening symptoms arise. - Comprehensive metabolic panel - CBC w/Diff/Platelet - Lipase  2. Hypokalemia Will update labs and adjust dose if needed - Comprehensive metabolic panel  3. Urinary tract infection with hematuria, site unspecified - CULTURE, URINE COMPREHENSIVE  4. Dysuria - POCT Urinalysis Dipstick   General Counseling: Mialee verbalizes understanding of the findings of todays visit and agrees with plan of treatment. I have discussed any further diagnostic evaluation that may be needed or ordered today. We also reviewed her medications today. she has been encouraged to call the office with any questions or concerns that should arise related to todays visit.    Counseling:    Orders Placed This Encounter  Procedures   CULTURE, URINE COMPREHENSIVE   Comprehensive metabolic panel   CBC w/Diff/Platelet   Lipase   POCT Urinalysis Dipstick    No orders of the defined types were placed in this encounter.   Time spent:30 Minutes

## 2022-05-07 ENCOUNTER — Encounter: Payer: Self-pay | Admitting: Internal Medicine

## 2022-05-07 ENCOUNTER — Telehealth: Payer: Self-pay

## 2022-05-07 NOTE — Telephone Encounter (Signed)
Spoke with patient regarding labs and advised patient to eat on a bland diet for the next few days per Leotis Shames.

## 2022-05-07 NOTE — Telephone Encounter (Signed)
-----   Message from Lauren K McDonough, PA-C sent at 05/07/2022 12:05 PM EDT ----- Please let her know that her potassium is still in normal range. Her renal function did get a little worse again but overall still stable. Hemoglobin also stable. Lipase was normal. Urine still pending. May try bland diet and more liquids/soft foods and see if this helps any. I do wonder if she is still having some of her flank pain from kidney stone as it is on same side as before. If GI symptoms are worsening we can try course of dual ABX. May need Gi follow up. 

## 2022-05-07 NOTE — Telephone Encounter (Signed)
-----   Message from Carlean Jews, PA-C sent at 05/07/2022 12:05 PM EDT ----- Please let her know that her potassium is still in normal range. Her renal function did get a little worse again but overall still stable. Hemoglobin also stable. Lipase was normal. Urine still pending. May try bland diet and more liquids/soft foods and see if this helps any. I do wonder if she is still having some of her flank pain from kidney stone as it is on same side as before. If GI symptoms are worsening we can try course of dual ABX. May need Gi follow up.

## 2022-05-10 LAB — CULTURE, URINE COMPREHENSIVE

## 2022-05-11 ENCOUNTER — Telehealth: Payer: Self-pay

## 2022-05-11 NOTE — Telephone Encounter (Signed)
-----   Message from Carlean Jews, PA-C sent at 05/10/2022  4:35 PM EDT ----- Please let her know that her urine came back with mixed urogenital flora which doesn't require ABX

## 2022-05-11 NOTE — Telephone Encounter (Signed)
Spoke with patient regarding urine results.

## 2022-05-12 ENCOUNTER — Other Ambulatory Visit: Payer: Self-pay

## 2022-05-12 ENCOUNTER — Telehealth: Payer: Self-pay

## 2022-05-12 MED ORDER — CIPROFLOXACIN HCL 500 MG PO TABS: 500.0000 mg | ORAL_TABLET | Freq: Two times a day (BID) | ORAL | 0 refills | Status: DC

## 2022-05-12 NOTE — Telephone Encounter (Signed)
Per Leotis Shames it is ok to send patient Cipro since she is still having symptoms.

## 2022-05-24 ENCOUNTER — Ambulatory Visit (INDEPENDENT_AMBULATORY_CARE_PROVIDER_SITE_OTHER): Payer: 59 | Admitting: Nurse Practitioner

## 2022-05-24 ENCOUNTER — Encounter: Payer: Self-pay | Admitting: Nurse Practitioner

## 2022-05-24 VITALS — BP 150/72 | HR 73 | Temp 97.3°F | Resp 16 | Ht 64.0 in | Wt 247.6 lb

## 2022-05-24 DIAGNOSIS — R140 Abdominal distension (gaseous): Secondary | ICD-10-CM | POA: Diagnosis not present

## 2022-05-24 DIAGNOSIS — B3731 Acute candidiasis of vulva and vagina: Secondary | ICD-10-CM | POA: Diagnosis not present

## 2022-05-24 DIAGNOSIS — R109 Unspecified abdominal pain: Secondary | ICD-10-CM

## 2022-05-24 DIAGNOSIS — E876 Hypokalemia: Secondary | ICD-10-CM

## 2022-05-24 MED ORDER — DICYCLOMINE HCL 20 MG PO TABS
20.0000 mg | ORAL_TABLET | Freq: Three times a day (TID) | ORAL | 2 refills | Status: DC
Start: 2022-05-24 — End: 2022-08-23

## 2022-05-24 MED ORDER — FLUCONAZOLE 150 MG PO TABS
150.0000 mg | ORAL_TABLET | Freq: Once | ORAL | 0 refills | Status: AC
Start: 2022-05-24 — End: 2022-05-24

## 2022-05-24 NOTE — Progress Notes (Signed)
[facility] [address] [place], [place] [address]  Internal MEDICINE  Office Visit Note  Patient Name: Leslie [name]  [id number]  [id number]  Date of Service: 05/24/2022  Chief Complaint  Patient presents with   Hypertension   Hyperlipidemia   Gastroesophageal Reflux   Follow-up    New med eval    HPI Leslie [name] presents for a follow-up visit for epigastric pain, abdominal cramping and bloating.  Discussed labs, nothing significantly changed from her baseline  Epigastric pain has improved, but still present -- also having nausea, constipation, bloating and cramping.  --Has yeast infection, from antibiotic use.  Potassium level is normal Stopped losartan which may have been causing upset stomach. -- will discuss with her nephrologist.    Current Medication: Outpatient Encounter Medications as of 05/24/2022  Medication Sig   albuterol (VENTOLIN HFA) 108 (90 Base) MCG/ACT inhaler INHALE 2 PUFFS INTO THE LUNGS EVERY 6 HOURS AS NEEDED FOR WHEEZING OR SHORTNESS OF BREATH   ALPRAZolam (XANAX) 0.5 MG tablet TAKE 1/2 TABLET BY MOUTH AT BEDTIME AS NEEDED FOR ANXIETY   amLODipine (NORVASC) 5 MG tablet Take 5 mg by mouth daily.   ampicillin (PRINCIPEN) 500 MG capsule Take 1 capsule (500 mg total) by mouth 3 (three) times daily.   aspirin 81 MG EC tablet Take 81 mg by mouth daily.   atorvastatin (LIPITOR) 80 MG tablet Take 1 tablet (80 mg total) by mouth daily.   calcium carbonate (TUMS - DOSED IN MG ELEMENTAL CALCIUM) 500 MG chewable tablet Chew 500 mg by mouth daily as needed for indigestion or heartburn.   chlorpheniramine-HYDROcodone (TUSSIONEX) 10-8 MG/5ML Take 5 mLs by mouth every 12 (twelve) hours as needed.   dicyclomine (BENTYL) 20 MG tablet Take 1 tablet (20 mg total) by mouth 4 (four) times daily -  before meals and at bedtime.   diltiazem (CARDIZEM) 60 MG tablet Take 1 tablet (60 mg total) by mouth 3 (three) times daily as needed (for tachycardia).   dronedarone  (MULTAQ) 400 MG tablet Take 1 tablet (400 mg total) by mouth 2 (two) times daily with a meal.   ferrous fumarate-b12-vitamic C-folic acid (FEROCON) capsule TAKE 1 CAPSULE BY MOUTH TWICE A DAY AFTER MEALS   ferrous sulfate 325 (65 FE) MG EC tablet Take 325 mg by mouth in the morning and at bedtime.   fluconazole (DIFLUCAN) 150 MG tablet Take 1 tablet (150 mg total) by mouth once for 1 dose. May take an additional dose after 3 days if still symptomatic.   Insulin Pen Needle 32G X 6 MM MISC Use with victoza   liraglutide (VICTOZA) 18 MG/3ML SOPN Start 0.6mg  SQ once a day for 7 days, then increase to 1.2mg  once a day   losartan (COZAAR) 50 MG tablet Take 50 mg by mouth daily.   metoprolol succinate (TOPROL-XL) 100 MG 24 hr tablet TAKE 1 TABLET(100 MG) BY MOUTH TWICE DAILY WITH OR IMMEDIATELY FOLLOWING A MEAL   omeprazole (PRILOSEC) 40 MG capsule TAKE ONE CAPSULE BY MOUTH DAILY   potassium chloride SA (KLOR-CON M) 20 MEQ tablet Take 2 tabs  twice a day   spironolactone (ALDACTONE) 25 MG tablet Take 1 tablet (25 mg total) by mouth daily.   sucralfate (CARAFATE) 1 g tablet Take 1 tablet (1 g total) by mouth 4 (four) times daily -  with meals and at bedtime.   torsemide (DEMADEX) 20 MG tablet Take 1 tablet (20 mg total) by mouth 2 (two) times daily.   warfarin (COUMADIN)  2 MG tablet TAKE 1/2 TO 1 TABLET BY MOUTH DAILY AS DIRECTED BY THE ANTI-COAG CLINIC (Patient taking differently: Take 1-2 mg by mouth See admin instructions. Take 2 mg every day every day except on Sunday Thursday take 1 mg per patient)   [DISCONTINUED] ciprofloxacin (CIPRO) 500 MG tablet Take 1 tablet (500 mg total) by mouth 2 (two) times daily.   No facility-administered encounter medications on file as of 05/24/2022.    Surgical History: Past Surgical History:  Procedure Laterality Date   ABDOMINAL HYSTERECTOMY     total   ABDOMINAL HYSTERECTOMY     BREAST BIOPSY Bilateral 2012   BREAST BIOPSY Right 08-07-12   fibroadenomatous  changes and columnar cells   BREAST BIOPSY  02/03/2015   stereo byrnett   BUBBLE STUDY  04/01/2020   Procedure: BUBBLE STUDY;  Surgeon: Parke Poisson, MD;  Location: [facility] ENDOSCOPY;  Service: Cardiovascular;;   CARDIAC CATHETERIZATION     CHOLECYSTECTOMY N/A 02/27/2016   Procedure: LAPAROSCOPIC CHOLECYSTECTOMY;  Surgeon: Kieth Brightly, MD;  Location: [facility] ORS;  Service: General;  Laterality: N/A;   CLIPPING OF ATRIAL APPENDAGE  04/29/2020   Procedure: CLIPPING OF ATRIAL APPENDAGE USING ATRICURE  PRO2 CLIP SIZE ;  Surgeon: Purcell Nails, MD;  Location: [facility] OR;  Service: Open Heart Surgery;;   COLONOSCOPY WITH PROPOFOL N/A 09/26/2018   Procedure: COLONOSCOPY WITH PROPOFOL;  Surgeon: Midge Minium, MD;  Location: [facility] ENDOSCOPY;  Service: Endoscopy;  Laterality: N/A;   COLONOSCOPY WITH PROPOFOL N/A 07/16/2021   Procedure: COLONOSCOPY WITH PROPOFOL;  Surgeon: Midge Minium, MD;  Location: [facility] ENDOSCOPY;  Service: Endoscopy;  Laterality: N/A;   CYSTOSCOPY W/ RETROGRADES Right 08/18/2018   Procedure: CYSTOSCOPY WITH RETROGRADE PYELOGRAM;  Surgeon: Sondra Come, MD;  Location: [facility] ORS;  Service: Urology;  Laterality: Right;   CYSTOSCOPY/URETEROSCOPY/HOLMIUM LASER/STENT PLACEMENT Right 08/18/2018   Procedure: CYSTOSCOPY/URETEROSCOPY/STENT PLACEMENT;  Surgeon: Sondra Come, MD;  Location: [facility] ORS;  Service: Urology;  Laterality: Right;   DIAGNOSTIC LAPAROSCOPY     ESOPHAGOGASTRODUODENOSCOPY N/A 07/16/2021   Procedure: ESOPHAGOGASTRODUODENOSCOPY (EGD);  Surgeon: Midge Minium, MD;  Location: [facility] ENDOSCOPY;  Service: Endoscopy;  Laterality: N/A;   ESOPHAGOGASTRODUODENOSCOPY (EGD) WITH PROPOFOL N/A 09/26/2018   Procedure: ESOPHAGOGASTRODUODENOSCOPY (EGD) WITH PROPOFOL;  Surgeon: Midge Minium, MD;  Location: [facility] ENDOSCOPY;  Service: Endoscopy;  Laterality: N/A;   GIVENS CAPSULE STUDY N/A 11/03/2018   Procedure: GIVENS CAPSULE STUDY;  Surgeon: Midge Minium, MD;  Location: [facility] ENDOSCOPY;   Service: Endoscopy;  Laterality: N/A;   JOINT REPLACEMENT Left    knee   KNEE ARTHROPLASTY Right 04/09/2019   Procedure: COMPUTER ASSISTED TOTAL KNEE ARTHROPLASTY;  Surgeon: Donato Heinz, MD;  Location: [facility] ORS;  Service: Orthopedics;  Laterality: Right;   KNEE CLOSED REDUCTION Left 04/15/2015   Procedure: CLOSED MANIPULATION KNEE;  Surgeon: Juanell Fairly, MD;  Location: [facility] ORS;  Service: Orthopedics;  Laterality: Left;   KNEE SURGERY     MAZE N/A 04/29/2020   Procedure: MAZE;  Surgeon: Purcell Nails, MD;  Location: [facility] OR;  Service: Open Heart Surgery;  Laterality: N/A;   MITRAL VALVE REPLACEMENT N/A 04/29/2020   Procedure: MITRAL VALVE (MV) REPLACEMENT USING ON-X VALVE SIZE 27/29MM;  Surgeon: Purcell Nails, MD;  Location: [facility] OR;  Service: Open Heart Surgery;  Laterality: N/A;   MULTIPLE EXTRACTIONS WITH ALVEOLOPLASTY N/A 02/28/2020   Procedure: MULTIPLE EXTRACTION WITH ALVEOLOPLASTY;  Surgeon: Sharman Cheek, DMD;  Location: [facility] OR;  Service: Dentistry;  Laterality: N/A;   RIGHT/LEFT  HEART CATH AND CORONARY ANGIOGRAPHY Bilateral 09/06/2019   Procedure: RIGHT/LEFT HEART CATH AND CORONARY ANGIOGRAPHY;  Surgeon: Antonieta Iba, MD;  Location: [facility] INVASIVE CV LAB;  Service: Cardiovascular;  Laterality: Bilateral;   TEE WITHOUT CARDIOVERSION N/A 09/13/2017   Procedure: TRANSESOPHAGEAL ECHOCARDIOGRAM (TEE);  Surgeon: Yvonne Kendall, MD;  Location: [facility] ORS;  Service: Cardiovascular;  Laterality: N/A;   TEE WITHOUT CARDIOVERSION N/A 04/01/2020   Procedure: TRANSESOPHAGEAL ECHOCARDIOGRAM (TEE);  Surgeon: Parke Poisson, MD;  Location: [facility] ENDOSCOPY;  Service: Cardiovascular;  Laterality: N/A;   TEE WITHOUT CARDIOVERSION N/A 04/29/2020   Procedure: TRANSESOPHAGEAL ECHOCARDIOGRAM (TEE);  Surgeon: Purcell Nails, MD;  Location: [facility] OR;  Service: Open Heart Surgery;  Laterality: N/A;   TOTAL KNEE ARTHROPLASTY Left 12/25/2014   Procedure: TOTAL KNEE ARTHROPLASTY;  Surgeon: Juanell Fairly,  MD;  Location: [facility] ORS;  Service: Orthopedics;  Laterality: Left;   TUBAL LIGATION      Medical History: Past Medical History:  Diagnosis Date   (HFpEF) heart failure with preserved ejection fraction (HCC)    a. 08/2017 Echo: EF 55-60%.  Grade 2 diastolic dysfunction; b. 05/2020 Echo: EF 50-55%, no rwma, Nl RV fxn, nl fxn'ing mech MV.   Allergy    Anemia    Anxiety    Asthma    BRCA negative 03/22/2013   Bronchitis 02/19/2016   ON LEVAQUIN PO   Chest tightness    a. 08/2019 Cath: nl cors.   Cigarette smoker 09/11/2017   8-10 day   Dysrhythmia    GERD (gastroesophageal reflux disease)    History of kidney stones    Hyperlipidemia    Hypertension    Interstitial lung disease (HCC)    a. CT 2013 b. 02/2018 CXR noted recurrent intersistial changes ILD vs chronic bronchitis   Moderate mitral stenosis    a.  08/2017 TEE: EF 60 to 65%.  Moderate mitral stenosis.  Mean gradient 14 mmHg.  Valve area 2.59 cm by planimetry, 2.72 cm by pressure half-time.   PAF (paroxysmal atrial fibrillation) (HCC)    a. 08/2017 s/p TEE/DCCV; b. CHA2DS2VASc = 2-->warfarin.   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 04/2020 s/p 27/29 mm Onyx mech mitral valve-->chronic coumadin; b. 05/2020 Echo: Nl fxn'ing mech MV.    Family History: Family History  Problem Relation Age of Onset   Cancer Mother 70       breast   Hypertension Mother    Cancer Maternal Aunt        breast   Cancer Maternal Grandmother        breast   Osteoarthritis Father    Hypertension Father     Social History   Socioeconomic History   Marital status: Married    Spouse name: Not on file   Number of children: Not on file   Years of education: Not on file   Highest education level: Not on file   Occupational History   Not on file  Tobacco Use   Smoking status: Former    Packs/day: 0.50    Years: 20.00    Additional pack years: 0.00    Total pack years: 10.00    Types: Cigarettes    Quit date: 01/2016    Years since quitting: 6.3   Smokeless tobacco: Never  Vaping  Use   Vaping Use: Never used  Substance and Sexual Activity   Alcohol use: Yes    Comment: rarely   Drug use: No   Sexual activity: Not on file  Other Topics Concern   Not on file  Social History Narrative   Not on file   Social Determinants of Health   Financial Resource Strain: Low Risk  (05/07/2020)   Overall Financial Resource Strain (CARDIA)    Difficulty of Paying Living Expenses: Not very hard  Food Insecurity: Unknown (11/16/2021)   Hunger Vital Sign    Worried About Running Out of Food in the Last Year: Not on file    Ran Out of Food in the Last Year: Never true  Transportation Needs: No Transportation Needs (11/16/2021)   PRAPARE - Administrator, Civil Service (Medical): No    Lack of Transportation (Non-Medical): No  Physical Activity: Not on file  Stress: Not on file  Social Connections: Not on file  Intimate Partner Violence: Not At Risk (11/16/2021)   Humiliation, Afraid, Rape, and Kick questionnaire    Fear of Current or Ex-Partner: No    Emotionally Abused: No    Physically Abused: No    Sexually Abused: No      Review of Systems  Constitutional:  Negative for fatigue and fever.  HENT:  Negative for congestion, mouth sores and postnasal drip.   Respiratory:  Negative for cough, chest tightness, shortness of breath and wheezing.   Cardiovascular: Negative.  Negative for chest pain and palpitations.  Gastrointestinal:  Positive for abdominal distention, abdominal pain, diarrhea and nausea.  Genitourinary:  Positive for dysuria, flank pain and frequency.  Psychiatric/Behavioral: Negative.      Vital Signs: BP (!) 150/72   Pulse 73   Temp (!) 97.3 F (36.3 C)    Resp 16   Ht 5\' 4"  (1.626 m)   Wt 247 lb 9.6 oz (112.3 kg)   SpO2 94%   BMI 42.50 kg/m    Physical Exam Vitals reviewed.  Constitutional:      General: She is not in acute distress.    Appearance: Normal appearance. She is obese. She is not ill-appearing.  HENT:     Head: Normocephalic and atraumatic.  Eyes:     Pupils: Pupils are equal, round, and reactive to light.  Cardiovascular:     Rate and Rhythm: Normal rate and regular rhythm.  Pulmonary:     Effort: Pulmonary effort is normal. No respiratory distress.  Abdominal:     General: There is no distension.     Palpations: Abdomen is soft. There is no mass.     Tenderness: There is no abdominal tenderness.     Hernia: No hernia is present.  Neurological:     Mental Status: She is alert and oriented to person, place, and time.  Psychiatric:        Mood and Affect: Mood normal.        Behavior: Behavior normal.        Assessment/Plan: 1. Abdominal bloating with cramps Dicyclomine prescribed. Explained to patient she can tak4 times daily as needed as instructed. Will see if this helps with her abdominal pain and cramping, follow up with Hamilton Capri in June at next appt.  - dicyclomine (BENTYL) 20 MG tablet; Take 1 tablet (20 mg total) by mouth 4 (four) times daily -  before meals and at bedtime.  Dispense: 120 tablet; Refill: 2  2. Hypokalemia resolved  3. Vulvovaginal candidiasis  Fluconazole prescribed.  - fluconazole (DIFLUCAN) 150 MG tablet; Take 1 tablet (150 mg total) by mouth once for 1 dose. May take an additional dose after 3 days if still symptomatic.  Dispense: 3 tablet; Refill: 0   General Counseling: Anjanae verbalizes understanding of the findings of todays visit and agrees with plan of treatment. I have discussed any further diagnostic evaluation that may be needed or ordered today. We also reviewed her medications today. she has been encouraged to call the office with any questions or concerns that  should arise related to todays visit.    No orders of the defined types were placed in this encounter.   Meds ordered this encounter  Medications   fluconazole (DIFLUCAN) 150 MG tablet    Sig: Take 1 tablet (150 mg total) by mouth once for 1 dose. May take an additional dose after 3 days if still symptomatic.    Dispense:  3 tablet    Refill:  0   dicyclomine (BENTYL) 20 MG tablet    Sig: Take 1 tablet (20 mg total) by mouth 4 (four) times daily -  before meals and at bedtime.    Dispense:  120 tablet    Refill:  2    Fill today    Return for previously scheduled, F/U with Lauren PA-C in june.   Total time spent:30 Minutes Time spent includes review of chart, medications, test results, and follow up plan with the patient.   [place] Controlled Substance Database was reviewed by me.  This patient was seen by Sallyanne Kuster, FNP-C in collaboration with Dr. Beverely Risen as a part of collaborative care agreement.   Kailyn Dubie R. Tedd Sias, MSN, FNP-C Internal medicine

## 2022-05-26 ENCOUNTER — Encounter: Payer: Self-pay | Admitting: Internal Medicine

## 2022-06-02 ENCOUNTER — Ambulatory Visit: Payer: 59 | Attending: Cardiovascular Disease

## 2022-06-02 ENCOUNTER — Telehealth: Payer: Self-pay

## 2022-06-02 NOTE — Telephone Encounter (Signed)
Lpmtcb and reschedule INR check 

## 2022-06-03 ENCOUNTER — Other Ambulatory Visit: Payer: Self-pay | Admitting: Physician Assistant

## 2022-06-09 ENCOUNTER — Encounter: Payer: Self-pay | Admitting: Cardiovascular Disease

## 2022-06-09 NOTE — Telephone Encounter (Signed)
Patient states she is calling to reschedule INR check and would like to know if she can come in today for this. Please advise.

## 2022-06-09 NOTE — Telephone Encounter (Signed)
Error

## 2022-06-11 ENCOUNTER — Other Ambulatory Visit: Payer: Self-pay

## 2022-06-11 ENCOUNTER — Emergency Department (HOSPITAL_COMMUNITY): Payer: 59

## 2022-06-11 ENCOUNTER — Emergency Department (HOSPITAL_COMMUNITY)
Admission: EM | Admit: 2022-06-11 | Discharge: 2022-06-11 | Disposition: A | Discharge status: Home / Self Care | Payer: 59 | Attending: Emergency Medicine | Admitting: Emergency Medicine

## 2022-06-11 DIAGNOSIS — I10 Essential (primary) hypertension: Secondary | ICD-10-CM | POA: Insufficient documentation

## 2022-06-11 DIAGNOSIS — Z7984 Long term (current) use of oral hypoglycemic drugs: Secondary | ICD-10-CM | POA: Diagnosis not present

## 2022-06-11 DIAGNOSIS — R1031 Right lower quadrant pain: Secondary | ICD-10-CM | POA: Diagnosis not present

## 2022-06-11 DIAGNOSIS — R7989 Other specified abnormal findings of blood chemistry: Secondary | ICD-10-CM | POA: Insufficient documentation

## 2022-06-11 DIAGNOSIS — N281 Cyst of kidney, acquired: Secondary | ICD-10-CM | POA: Diagnosis not present

## 2022-06-11 DIAGNOSIS — N200 Calculus of kidney: Secondary | ICD-10-CM | POA: Diagnosis not present

## 2022-06-11 DIAGNOSIS — R079 Chest pain, unspecified: Secondary | ICD-10-CM | POA: Diagnosis not present

## 2022-06-11 DIAGNOSIS — Z794 Long term (current) use of insulin: Secondary | ICD-10-CM | POA: Diagnosis not present

## 2022-06-11 DIAGNOSIS — R0602 Shortness of breath: Secondary | ICD-10-CM | POA: Diagnosis not present

## 2022-06-11 DIAGNOSIS — I480 Paroxysmal atrial fibrillation: Secondary | ICD-10-CM | POA: Insufficient documentation

## 2022-06-11 DIAGNOSIS — E669 Obesity, unspecified: Secondary | ICD-10-CM | POA: Insufficient documentation

## 2022-06-11 DIAGNOSIS — R109 Unspecified abdominal pain: Secondary | ICD-10-CM | POA: Diagnosis not present

## 2022-06-11 DIAGNOSIS — Z79899 Other long term (current) drug therapy: Secondary | ICD-10-CM | POA: Insufficient documentation

## 2022-06-11 LAB — URINALYSIS, ROUTINE W REFLEX MICROSCOPIC
Bilirubin Urine: NEGATIVE
Glucose, UA: NEGATIVE mg/dL
Ketones, ur: NEGATIVE mg/dL
Leukocytes,Ua: NEGATIVE
Nitrite: NEGATIVE
Protein, ur: NEGATIVE mg/dL
Specific Gravity, Urine: 1.008 (ref 1.005–1.030)
pH: 5 (ref 5.0–8.0)

## 2022-06-11 LAB — CBC
HCT: 34.8 % — ABNORMAL LOW (ref 36.0–46.0)
Hemoglobin: 11.3 g/dL — ABNORMAL LOW (ref 12.0–15.0)
MCH: 28.9 pg (ref 26.0–34.0)
MCHC: 32.5 g/dL (ref 30.0–36.0)
MCV: 89 fL (ref 80.0–100.0)
Platelets: 338 10*3/uL (ref 150–400)
RBC: 3.91 MIL/uL (ref 3.87–5.11)
RDW: 12 % (ref 11.5–15.5)
WBC: 7.2 10*3/uL (ref 4.0–10.5)
nRBC: 0 % (ref 0.0–0.2)

## 2022-06-11 LAB — BASIC METABOLIC PANEL
Anion gap: 5 (ref 5–15)
BUN: 29 mg/dL — ABNORMAL HIGH (ref 6–20)
CO2: 26 mmol/L (ref 22–32)
Calcium: 8.6 mg/dL — ABNORMAL LOW (ref 8.9–10.3)
Chloride: 103 mmol/L (ref 98–111)
Creatinine, Ser: 2.24 mg/dL — ABNORMAL HIGH (ref 0.44–1.00)
GFR, Estimated: 25 mL/min — ABNORMAL LOW (ref >60)
Glucose, Bld: 104 mg/dL — ABNORMAL HIGH (ref 70–99)
Potassium: 3.9 mmol/L (ref 3.5–5.1)
Sodium: 134 mmol/L — ABNORMAL LOW (ref 135–145)

## 2022-06-11 MED ORDER — HYDROCODONE-ACETAMINOPHEN 5-325 MG PO TABS
1.0000 | ORAL_TABLET | Freq: Once | ORAL | Status: AC
  Administered 2022-06-11: 1 via ORAL
  Filled 2022-06-11: qty 1

## 2022-06-11 MED ORDER — ONDANSETRON HCL 4 MG/2ML IJ SOLN
4.0000 mg | Freq: Once | INTRAMUSCULAR | Status: AC
  Administered 2022-06-11: 4 mg via INTRAVENOUS
  Filled 2022-06-11: qty 2

## 2022-06-11 MED ORDER — DIPHENHYDRAMINE HCL 50 MG/ML IJ SOLN
25.0000 mg | Freq: Once | INTRAMUSCULAR | Status: AC
  Administered 2022-06-11: 25 mg via INTRAVENOUS
  Filled 2022-06-11: qty 1

## 2022-06-11 NOTE — ED Provider Notes (Signed)
[place] EMERGENCY DEPARTMENT AT [facility] Provider Note   CSN: [id number] Arrival date & time: 06/11/22  1351     History  Chief Complaint  Patient presents with   Flank Pain   Abdominal Pain   Headache    Leslie [name] is a 57 y.o. female with past medical history significant for obesity, hypertension, tobacco use, paroxysmal A-fib, mitral valve stenosis, depression, previous noted a 7 mm intrarenal right kidney stone presents with concern for right flank pain, with radiation into the right abdomen, headache, bloating, nausea, vomiting for the last several days.  Patient does endorse some burning with urination.  She denies any changes in bowel movements, diarrhea, constipation.  She denies any fever, chills.  She denies any chest pain, but does report that she has had some generalized shortness of breath.  She reports that she generally does not feel well.  Patient reports that her pain does improve with Tylenol but then it always comes right back.   Flank Pain Associated symptoms include abdominal pain and headaches.  Abdominal Pain Headache Associated symptoms: abdominal pain        Home Medications Prior to Admission medications   Medication Sig Start Date End Date Taking? Authorizing Provider  albuterol (VENTOLIN HFA) 108 (90 Base) MCG/ACT inhaler INHALE 2 PUFFS INTO THE LUNGS EVERY 6 HOURS AS NEEDED FOR WHEEZING OR SHORTNESS OF BREATH 11/27/21   McDonough, Salomon Fick, PA-C  ALPRAZolam Prudy Feeler) 0.5 MG tablet TAKE 1/2 TABLET BY MOUTH AT BEDTIME AS NEEDED FOR ANXIETY 03/19/22   McDonough, Lauren K, PA-C  amLODipine (NORVASC) 5 MG tablet Take 5 mg by mouth daily.    [provider]  ampicillin (PRINCIPEN) 500 MG capsule Take 1 capsule (500 mg total) by mouth 3 (three) times daily. 04/07/22   McDonough, Salomon Fick, PA-C  aspirin 81 MG EC tablet Take 81 mg by mouth daily.    [provider]  atorvastatin (LIPITOR) 80 MG tablet Take 1 tablet (80 mg  total) by mouth daily. 03/01/22   Debbe Odea, MD  calcium carbonate (TUMS - DOSED IN MG ELEMENTAL CALCIUM) 500 MG chewable tablet Chew 500 mg by mouth daily as needed for indigestion or heartburn.    [provider]  chlorpheniramine-HYDROcodone (TUSSIONEX) 10-8 MG/5ML Take 5 mLs by mouth every 12 (twelve) hours as needed. 12/16/21   Sallyanne Kuster, NP  dicyclomine (BENTYL) 20 MG tablet Take 1 tablet (20 mg total) by mouth 4 (four) times daily -  before meals and at bedtime. 05/24/22   Sallyanne Kuster, NP  diltiazem (CARDIZEM) 60 MG tablet Take 1 tablet (60 mg total) by mouth 3 (three) times daily as needed (for tachycardia). 04/26/22   Antonieta Iba, MD  dronedarone (MULTAQ) 400 MG tablet Take 1 tablet (400 mg total) by mouth 2 (two) times daily with a meal. 04/26/22   Gollan, Tollie Pizza, MD  ferrous fumarate-b12-vitamic C-folic acid (FEROCON) capsule TAKE 1 CAPSULE BY MOUTH TWICE A DAY AFTER MEALS 03/29/22   McDonough, Salomon Fick, PA-C  ferrous sulfate 325 (65 FE) MG EC tablet Take 325 mg by mouth in the morning and at bedtime. 02/10/22   Antonieta Iba, MD  Insulin Pen Needle 32G X 6 MM MISC Use with victoza 05/04/22   McDonough, Lauren K, PA-C  liraglutide (VICTOZA) 18 MG/3ML SOPN Start 0.6mg  SQ once a day for 7 days, then increase to 1.2mg  once a day 04/29/22   McDonough, Salomon Fick, PA-C  losartan (COZAAR) 50 MG  tablet Take 50 mg by mouth daily. 04/15/22 04/15/23  [provider]  metoprolol succinate (TOPROL-XL) 100 MG 24 hr tablet TAKE 1 TABLET(100 MG) BY MOUTH TWICE DAILY WITH OR IMMEDIATELY FOLLOWING A MEAL 09/17/21   Creig Hines, NP  omeprazole (PRILOSEC) 40 MG capsule TAKE ONE CAPSULE BY MOUTH DAILY 08/09/21   McDonough, Salomon Fick, PA-C  potassium chloride SA (KLOR-CON M20) 20 MEQ tablet TAKE 2 TABLETS BY MOUTH TWICE A DAY 06/03/22   McDonough, Lauren K, PA-C  spironolactone (ALDACTONE) 25 MG tablet Take 1 tablet (25 mg total) by mouth daily. 04/16/22    Debbe Odea, MD  sucralfate (CARAFATE) 1 g tablet Take 1 tablet (1 g total) by mouth 4 (four) times daily -  with meals and at bedtime. 03/01/22   Debbe Odea, MD  torsemide (DEMADEX) 20 MG tablet Take 1 tablet (20 mg total) by mouth 2 (two) times daily. 02/17/22   Antonieta Iba, MD  warfarin (COUMADIN) 2 MG tablet TAKE 1/2 TO 1 TABLET BY MOUTH DAILY AS DIRECTED BY THE ANTI-COAG CLINIC Patient taking differently: Take 1-2 mg by mouth See admin instructions. Take 2 mg every day every day except on Sunday Thursday take 1 mg per patient 11/02/21   End, Cristal Deer, MD      Allergies    Morphine, Oxycodone hcl, and Dilaudid [hydromorphone hcl]    Review of Systems   Review of Systems  Gastrointestinal:  Positive for abdominal pain.  Genitourinary:  Positive for flank pain.  Neurological:  Positive for headaches.  All other systems reviewed and are negative.   Physical Exam Updated Vital Signs BP (!) 162/71 (BP Location: Left Arm)   Pulse 72   Temp 98.7 F (37.1 C) (Oral)   Resp 18   Ht 5\' 4"  (1.626 m)   Wt 108.9 kg   SpO2 99%   BMI 41.20 kg/m  Physical Exam Vitals and nursing note reviewed.  Constitutional:      General: She is not in acute distress.    Appearance: Normal appearance. She is obese.  HENT:     Head: Normocephalic and atraumatic.  Eyes:     General:        Right eye: No discharge.        Left eye: No discharge.  Cardiovascular:     Rate and Rhythm: Normal rate and regular rhythm.     Heart sounds: No murmur heard.    No friction rub. No gallop.  Pulmonary:     Effort: Pulmonary effort is normal.     Breath sounds: Normal breath sounds.  Abdominal:     General: Bowel sounds are normal.     Palpations: Abdomen is soft.     Comments: Some tenderness in the flank and right lower quadrant without rebound, rigidity, guarding.  Normal bowel sounds throughout.  No CVA tenderness bilaterally.  Skin:    General: Skin is warm and dry.     Capillary  Refill: Capillary refill takes less than 2 seconds.  Neurological:     Mental Status: She is alert and oriented to person, place, and time.  Psychiatric:        Mood and Affect: Mood normal.        Behavior: Behavior normal.     ED Results / Procedures / Treatments   Labs (all labs ordered are listed, but only abnormal results are displayed) Labs Reviewed  URINALYSIS, ROUTINE W REFLEX MICROSCOPIC - Abnormal; Notable for the following components:  Result Value   Color, Urine STRAW (*)    Hgb urine dipstick SMALL (*)    Bacteria, UA RARE (*)    All other components within normal limits  CBC - Abnormal; Notable for the following components:   Hemoglobin 11.3 (*)    HCT 34.8 (*)    All other components within normal limits  BASIC METABOLIC PANEL - Abnormal; Notable for the following components:   Sodium 134 (*)    Glucose, Bld 104 (*)    BUN 29 (*)    Creatinine, Ser 2.24 (*)    Calcium 8.6 (*)    GFR, Estimated 25 (*)    All other components within normal limits    EKG None  Radiology CT ABDOMEN PELVIS WO CONTRAST  Result Date: 06/11/2022 CLINICAL DATA:  Four day history of epigastric pain radiating to the right flank EXAM: CT ABDOMEN AND PELVIS WITHOUT CONTRAST TECHNIQUE: Multidetector CT imaging of the abdomen and pelvis was performed following the standard protocol without IV contrast. RADIATION DOSE REDUCTION: This exam was performed according to the departmental dose-optimization program which includes automated exposure control, adjustment of the mA and/or kV according to patient size and/or use of iterative reconstruction technique. COMPARISON:  CT abdomen and pelvis dated 11/11/2021, CT abdomen and pelvis dated 03/26/2020 FINDINGS: Lower chest: Unchanged 3 mm subpleural left lower lobe nodule (5:31) dating back to 03/26/2020, likely benign. No specific follow-up imaging recommended. No pleural effusion or pneumothorax demonstrated. Partially imaged heart size is  normal. Partially imaged mitral valve replacement. Hepatobiliary: No focal hepatic lesions. No intra or extrahepatic biliary ductal dilation. Cholecystectomy. Pancreas: No focal lesions or main ductal dilation. Spleen: Normal in size without focal abnormality. Adrenals/Urinary Tract: No adrenal nodules. No suspicious renal mass or hydronephrosis. Unchanged right upper pole simple cysts. No specific follow-up imaging recommended. Unchanged nonobstructing right renal stone. No focal bladder wall thickening. Stomach/Bowel: Small hiatal hernia. Normal appearance of the stomach. No evidence of bowel wall thickening, distention, or inflammatory changes. Normal appendix. Vascular/Lymphatic: Aortic atherosclerosis. No enlarged abdominal or pelvic lymph nodes. Reproductive: No adnexal masses. Other: No free fluid, fluid collection, or free air. Musculoskeletal: No acute or abnormal lytic or blastic osseous lesions. IMPRESSION: 1. No acute abnormality in the abdomen or pelvis. 2. Unchanged nonobstructing right renal stone. 3. Small hiatal hernia. 4. Aortic Atherosclerosis (ICD10-I70.0). Electronically Signed   By: Agustin Cree M.D.   On: 06/11/2022 15:52   DG Chest 2 View  Result Date: 06/11/2022 CLINICAL DATA:  Shortness of breath. Flank pain to right side/kidney area. EXAM: CHEST - 2 VIEW COMPARISON:  Chest radiographs 01/12/2022 FINDINGS: Postsurgical changes are again seen of cardiac valve prosthesis and atrial appendage closure device. Cardiac silhouette is at the upper limits of normal size. Moderate calcifications within the aortic arch, unchanged. The lungs are clear. No pleural effusion pneumothorax. Mild multilevel degenerative disc changes of the thoracic spine. IMPRESSION: No acute cardiopulmonary process. Electronically Signed   By: Neita Garnet M.D.   On: 06/11/2022 14:51    Procedures Procedures    Medications Ordered in ED Medications  diphenhydrAMINE (BENADRYL) injection 25 mg (25 mg Intravenous  Given 06/11/22 1502)  ondansetron (ZOFRAN) injection 4 mg (4 mg Intravenous Given 06/11/22 1502)  HYDROcodone-acetaminophen (NORCO/VICODIN) 5-325 MG per tablet 1 tablet (1 tablet Oral Given 06/11/22 1501)    ED Course/ Medical Decision Making/ A&P  Medical Decision Making Amount and/or Complexity of Data Reviewed Labs: ordered. Radiology: ordered.  Risk Prescription drug management.   This patient is a 57 y.o. female  who presents to the ED for concern of flank pain, shob.   Differential diagnoses prior to evaluation: The emergent differential diagnosis includes, but is not limited to,  AAA, renal artery/vein embolism/thrombosis, mesenteric ischemia, pyelonephritis, nephrolithiasis, cystitis, biliary colic, pancreatitis, perforated peptic ulcer, appendicitis, diverticulitis, bowel obstruction, asthma exacerbation, COPD exacerbation, acute upper respiratory infection, acute bronchitis, chronic bronchitis, interstitial lung disease, ARDS, PE, pneumonia, atypical ACS, carbon monoxide poisoning, spontaneous pneumothorax, new CHF vs CHF exacerbation, versus other . This is not an exhaustive differential.   Past Medical History / Co-morbidities / Social History: Hypertension, hyperlipidemia, anxiety, heart failure, obesity, interstitial lung disease, chronic kidney disease, tobacco abuse  Additional history: Chart reviewed. Pertinent results include: Reviewed lab work, imaging from previous ED visiti within last 6 month  Physical Exam: Physical exam performed. The pertinent findings include: Patient with hypertension emergency department, blood pressure elevated 160/71, vital signs otherwise stable.  She is tenderness in the right flank without focal right lower quadrant tenderness, she has no CVA tenderness.  No rebound, rigidity, guarding throughout, normal bowel sounds throughout.  Lab Tests/Imaging studies: I personally interpreted labs/imaging and the pertinent  results include: UA with small hemoglobin which may suggest that she could be having some degree of renal colic but otherwise no evidence of UTI, BMP with mild worsening of her chronic kidney disease, no AKI today, but I do encourage her to have close follow-up with her primary care doctor.  Her CBC is overall unremarkable notably with no leukocytosis, mild anemia, hemoglobin 11.3.  Independently interpreted plain, chest x-ray as well as CT abdomen pelvis which shows no evidence of acute intra-abdominal abnormality, intrathoracic abnormality, no expansion for patient's symptoms at this time.. I agree with the radiologist interpretation.  Cardiac monitoring: EKG obtained and interpreted by my attending physician which shows: NSR   Medications: I ordered medication including Vicodin, Benadryl, Zofran for pain, nausea.  I have reviewed the patients home medicines and have made adjustments as needed.   Disposition: After consideration of the diagnostic results and the patients response to treatment, I feel that patient with no evidence of acute intra-abdominal abnormality, unclear what is the cause of her pain, suspicious that she may be having some degree of renal colic, encouraged urologic follow-up, but no acute treatment indicated in the emergency department today.  Patient's pain is improved at time of discharge..   emergency department workup does not suggest an emergent condition requiring admission or immediate intervention beyond what has been performed at this time. The plan is: as above. The patient is safe for discharge and has been instructed to return immediately for worsening symptoms, change in symptoms or any other concerns.  Final Clinical Impression(s) / ED Diagnoses Final diagnoses:  Elevated serum creatinine  Right lower quadrant abdominal pain    Rx / DC Orders ED Discharge Orders     None         [place] 06/11/22 1735    Lorre Nick,  MD 06/13/22 [phone]

## 2022-06-11 NOTE — Discharge Instructions (Signed)
Please use Tylenol for pain.  You may use 1000 mg of Tylenol every 6 hours.  Not to exceed 4 g of Tylenol within 24 hours.  I would follow-up with your primary care doctor regarding your chronic kidney disease your creatinine continues to slowly worsen, but all of your lab work and imaging was otherwise reassuring today, a attached the contact information for urologist in case you are not having something called renal colic related to your intrarenal kidney stone which could be contributing to your pain.  However it has not moved from its previous location, is not causing an obstruction, and you do not have a urinary tract infection.

## 2022-06-11 NOTE — ED Triage Notes (Signed)
Pt arrived via POV. C/o headache and R flank pain that radiates to R abd. Pt has kidney stone in R kidney.  AOx4

## 2022-06-11 NOTE — ED Notes (Signed)
Patient transported to CT 

## 2022-06-11 NOTE — ED Notes (Signed)
Patient transported to X-ray 

## 2022-06-14 DIAGNOSIS — R300 Dysuria: Secondary | ICD-10-CM | POA: Diagnosis not present

## 2022-06-14 DIAGNOSIS — N390 Urinary tract infection, site not specified: Secondary | ICD-10-CM | POA: Diagnosis not present

## 2022-06-14 DIAGNOSIS — R109 Unspecified abdominal pain: Secondary | ICD-10-CM | POA: Diagnosis not present

## 2022-06-14 DIAGNOSIS — Z87442 Personal history of urinary calculi: Secondary | ICD-10-CM | POA: Diagnosis not present

## 2022-06-16 ENCOUNTER — Ambulatory Visit: Payer: 59 | Attending: Cardiovascular Disease

## 2022-06-16 DIAGNOSIS — I050 Rheumatic mitral stenosis: Secondary | ICD-10-CM | POA: Diagnosis not present

## 2022-06-16 DIAGNOSIS — Z5181 Encounter for therapeutic drug level monitoring: Secondary | ICD-10-CM | POA: Diagnosis not present

## 2022-06-16 DIAGNOSIS — I4891 Unspecified atrial fibrillation: Secondary | ICD-10-CM

## 2022-06-16 LAB — POCT INR: INR: 5.6 — AB (ref 2.0–3.0)

## 2022-06-16 NOTE — Patient Instructions (Signed)
HOLD TODAY, THURSDAY and FRIDAY then continue 1 tablet daily except 1/2 tablet on Mondays and Fridays. Stay consistent with greens. - Recheck INR in 3 weeks

## 2022-06-17 ENCOUNTER — Ambulatory Visit: Payer: Medicaid Other | Admitting: Physician Assistant

## 2022-06-17 DIAGNOSIS — N184 Chronic kidney disease, stage 4 (severe): Secondary | ICD-10-CM | POA: Diagnosis not present

## 2022-06-17 DIAGNOSIS — I129 Hypertensive chronic kidney disease with stage 1 through stage 4 chronic kidney disease, or unspecified chronic kidney disease: Secondary | ICD-10-CM | POA: Diagnosis not present

## 2022-06-17 DIAGNOSIS — N2581 Secondary hyperparathyroidism of renal origin: Secondary | ICD-10-CM | POA: Diagnosis not present

## 2022-06-17 DIAGNOSIS — E876 Hypokalemia: Secondary | ICD-10-CM | POA: Diagnosis not present

## 2022-06-17 DIAGNOSIS — E790 Hyperuricemia without signs of inflammatory arthritis and tophaceous disease: Secondary | ICD-10-CM | POA: Diagnosis not present

## 2022-06-18 DIAGNOSIS — E790 Hyperuricemia without signs of inflammatory arthritis and tophaceous disease: Secondary | ICD-10-CM | POA: Insufficient documentation

## 2022-06-18 DIAGNOSIS — N2581 Secondary hyperparathyroidism of renal origin: Secondary | ICD-10-CM | POA: Insufficient documentation

## 2022-06-18 DIAGNOSIS — I129 Hypertensive chronic kidney disease with stage 1 through stage 4 chronic kidney disease, or unspecified chronic kidney disease: Secondary | ICD-10-CM | POA: Insufficient documentation

## 2022-06-18 DIAGNOSIS — N184 Chronic kidney disease, stage 4 (severe): Secondary | ICD-10-CM | POA: Insufficient documentation

## 2022-06-24 ENCOUNTER — Ambulatory Visit (INDEPENDENT_AMBULATORY_CARE_PROVIDER_SITE_OTHER): Payer: Medicaid Other | Admitting: Physician Assistant

## 2022-06-24 ENCOUNTER — Encounter: Payer: Self-pay | Admitting: Physician Assistant

## 2022-06-24 VITALS — BP 130/80 | HR 77 | Temp 97.8°F | Resp 16 | Ht 64.0 in | Wt 244.0 lb

## 2022-06-24 DIAGNOSIS — R1013 Epigastric pain: Secondary | ICD-10-CM | POA: Diagnosis not present

## 2022-06-24 DIAGNOSIS — R140 Abdominal distension (gaseous): Secondary | ICD-10-CM | POA: Diagnosis not present

## 2022-06-24 DIAGNOSIS — E538 Deficiency of other specified B group vitamins: Secondary | ICD-10-CM | POA: Diagnosis not present

## 2022-06-24 DIAGNOSIS — I10 Essential (primary) hypertension: Secondary | ICD-10-CM

## 2022-06-24 DIAGNOSIS — Z1231 Encounter for screening mammogram for malignant neoplasm of breast: Secondary | ICD-10-CM

## 2022-06-24 DIAGNOSIS — E1165 Type 2 diabetes mellitus with hyperglycemia: Secondary | ICD-10-CM

## 2022-06-24 DIAGNOSIS — E559 Vitamin D deficiency, unspecified: Secondary | ICD-10-CM | POA: Diagnosis not present

## 2022-06-24 DIAGNOSIS — M25572 Pain in left ankle and joints of left foot: Secondary | ICD-10-CM | POA: Diagnosis not present

## 2022-06-24 DIAGNOSIS — R300 Dysuria: Secondary | ICD-10-CM

## 2022-06-24 DIAGNOSIS — E871 Hypo-osmolality and hyponatremia: Secondary | ICD-10-CM

## 2022-06-24 DIAGNOSIS — E782 Mixed hyperlipidemia: Secondary | ICD-10-CM

## 2022-06-24 DIAGNOSIS — M25472 Effusion, left ankle: Secondary | ICD-10-CM

## 2022-06-24 DIAGNOSIS — R5383 Other fatigue: Secondary | ICD-10-CM | POA: Diagnosis not present

## 2022-06-24 DIAGNOSIS — Z0001 Encounter for general adult medical examination with abnormal findings: Secondary | ICD-10-CM | POA: Diagnosis not present

## 2022-06-24 DIAGNOSIS — E876 Hypokalemia: Secondary | ICD-10-CM

## 2022-06-24 DIAGNOSIS — R109 Unspecified abdominal pain: Secondary | ICD-10-CM

## 2022-06-24 DIAGNOSIS — R946 Abnormal results of thyroid function studies: Secondary | ICD-10-CM

## 2022-06-24 NOTE — Progress Notes (Signed)
[facility] [address] [place], [place] [address]  Internal MEDICINE  Office Visit Note  Patient Name: Leslie [name]  [id number]  [id number]  Date of Service: 07/02/2022  Chief Complaint  Patient presents with   Annual Exam   Hypertension   Abdominal Pain     HPI Pt is here for routine health maintenance examination -Down another 3lbs since last visit -has been taking victoza but states she has to pay 200 per script due to not meeting deductible. Initially was denied trulicity but she states she did get info later stating she was approved. -Cautious about gym with crowds -Did order a stationary bike instead and plans to start this soon -Does like line dancing as well and walks [place] [employer] 2 days per week and plans to increase -left ankle pocket of fluid/swollen lateral ankle for a few weeks, a little better but still sore. No injury, no redness or warmth  Current Medication: Outpatient Encounter Medications as of 06/24/2022  Medication Sig   albuterol (VENTOLIN HFA) 108 (90 Base) MCG/ACT inhaler INHALE 2 PUFFS INTO THE LUNGS EVERY 6 HOURS AS NEEDED FOR WHEEZING OR SHORTNESS OF BREATH   ALPRAZolam (XANAX) 0.5 MG tablet TAKE 1/2 TABLET BY MOUTH AT BEDTIME AS NEEDED FOR ANXIETY   amLODipine (NORVASC) 5 MG tablet Take 5 mg by mouth daily.   ampicillin (PRINCIPEN) 500 MG capsule Take 1 capsule (500 mg total) by mouth 3 (three) times daily.   aspirin 81 MG EC tablet Take 81 mg by mouth daily.   atorvastatin (LIPITOR) 80 MG tablet Take 1 tablet (80 mg total) by mouth daily.   calcium carbonate (TUMS - DOSED IN MG ELEMENTAL CALCIUM) 500 MG chewable tablet Chew 500 mg by mouth daily as needed for indigestion or heartburn.   chlorpheniramine-HYDROcodone (TUSSIONEX) 10-8 MG/5ML Take 5 mLs by mouth every 12 (twelve) hours as needed.   dicyclomine (BENTYL) 20 MG tablet Take 1 tablet (20 mg total) by mouth 4 (four) times daily -  before meals and at bedtime.   diltiazem  (CARDIZEM) 60 MG tablet Take 1 tablet (60 mg total) by mouth 3 (three) times daily as needed (for tachycardia).   dronedarone (MULTAQ) 400 MG tablet Take 1 tablet (400 mg total) by mouth 2 (two) times daily with a meal.   ferrous fumarate-b12-vitamic C-folic acid (FEROCON) capsule TAKE 1 CAPSULE BY MOUTH TWICE A DAY AFTER MEALS   ferrous sulfate 325 (65 FE) MG EC tablet Take 325 mg by mouth in the morning and at bedtime.   Insulin Pen Needle 32G X 6 MM MISC Use with victoza   liraglutide (VICTOZA) 18 MG/3ML SOPN Start 0.6mg  SQ once a day for 7 days, then increase to 1.2mg  once a day   losartan (COZAAR) 50 MG tablet Take 50 mg by mouth daily.   metoprolol succinate (TOPROL-XL) 100 MG 24 hr tablet TAKE 1 TABLET(100 MG) BY MOUTH TWICE DAILY WITH OR IMMEDIATELY FOLLOWING A MEAL   potassium chloride SA (KLOR-CON M20) 20 MEQ tablet TAKE 2 TABLETS BY MOUTH TWICE A DAY   spironolactone (ALDACTONE) 25 MG tablet Take 1 tablet (25 mg total) by mouth daily.   sucralfate (CARAFATE) 1 g tablet Take 1 tablet (1 g total) by mouth 4 (four) times daily -  with meals and at bedtime.   warfarin (COUMADIN) 2 MG tablet TAKE 1/2 TO 1 TABLET BY MOUTH DAILY AS DIRECTED BY THE ANTI-COAG CLINIC (Patient taking differently: Take 1-2 mg by mouth See admin instructions.  Take 2 mg every day every day except on Sunday Thursday take 1 mg per patient)   [DISCONTINUED] omeprazole (PRILOSEC) 40 MG capsule TAKE ONE CAPSULE BY MOUTH DAILY   [DISCONTINUED] torsemide (DEMADEX) 20 MG tablet Take 1 tablet (20 mg total) by mouth 2 (two) times daily.   No facility-administered encounter medications on file as of 06/24/2022.    Surgical History: Past Surgical History:  Procedure Laterality Date   ABDOMINAL HYSTERECTOMY     total   ABDOMINAL HYSTERECTOMY     BREAST BIOPSY Bilateral 2012   BREAST BIOPSY Right 08-07-12   fibroadenomatous changes and columnar cells   BREAST BIOPSY  02/03/2015   stereo byrnett   BUBBLE STUDY  04/01/2020    Procedure: BUBBLE STUDY;  Surgeon: Parke Poisson, MD;  [facility]: [facility] END[facility]COPY;  Service: Cardiovascular;;   CARDIAC CATHETERIZATION     CHOLECYSTECTOMY N/A 02/27/2016   Procedure: LAPAROSCOPIC CHOLECYSTECTOMY;  Surgeon: Kieth Brightly, MD;  Location: [facility] ORS;  Service: General;  Laterality: N/A;   CLIPPING OF ATRIAL APPENDAGE  04/29/2020   Procedure: CLIPPING OF ATRIAL APPENDAGE USING ATRICURE  PRO2 CLIP SIZE ;  Surgeon: Purcell Nails, MD;  Location: [facility] OR;  Service: Open Heart Surgery;;   COLONOSCOPY WITH PROPOFOL N/A 09/26/2018   Procedure: COLONOSCOPY WITH PROPOFOL;  Surgeon: Midge Minium, MD;  Location: [facility] ENDOSCOPY;  Service: Endoscopy;  Laterality: N/A;   COLONOSCOPY WITH PROPOFOL N/A 07/16/2021   Procedure: COLONOSCOPY WITH PROPOFOL;  Surgeon: Midge Minium, MD;  Location: [facility] ENDOSCOPY;  Service: Endoscopy;  Laterality: N/A;   CYSTOSCOPY W/ RETROGRADES Right 08/18/2018   Procedure: CYSTOSCOPY WITH RETROGRADE PYELOGRAM;  Surgeon: Sondra Come, MD;  Location: [facility] ORS;  Service: Urology;  Laterality: Right;   CYSTOSCOPY/URETEROSCOPY/HOLMIUM LASER/STENT PLACEMENT Right 08/18/2018   Procedure: CYSTOSCOPY/URETEROSCOPY/STENT PLACEMENT;  Surgeon: Sondra Come, MD;  Location: [facility] ORS;  Service: Urology;  Laterality: Right;   DIAGNOSTIC LAPAROSCOPY     ESOPHAGOGASTRODUODENOSCOPY N/A 07/16/2021   Procedure: ESOPHAGOGASTRODUODENOSCOPY (EGD);  Surgeon: Midge Minium, MD;  Location: [facility] ENDOSCOPY;  Service: Endoscopy;  Laterality: N/A;   ESOPHAGOGASTRODUODENOSCOPY (EGD) WITH PROPOFOL N/A 09/26/2018   Procedure: ESOPHAGOGASTRODUODENOSCOPY (EGD) WITH PROPOFOL;  Surgeon: Midge Minium, MD;  Location: [facility] ENDOSCOPY;  Service: Endoscopy;  Laterality: N/A;   GIVENS CAPSULE STUDY N/A 11/03/2018   Procedure: GIVENS CAPSULE STUDY;  Surgeon: Midge Minium, MD;  Location: [facility] ENDOSCOPY;  Service: Endoscopy;  Laterality: N/A;   JOINT REPLACEMENT Left    knee   KNEE ARTHROPLASTY Right  04/09/2019   Procedure: COMPUTER ASSISTED TOTAL KNEE ARTHROPLASTY;  Surgeon: Donato Heinz, MD;  Location: [facility] ORS;  Service: Orthopedics;  Laterality: Right;   KNEE CLOSED REDUCTION Left 04/15/2015   Procedure: CLOSED MANIPULATION KNEE;  Surgeon: Juanell Fairly, MD;  Location: [facility] ORS;  Service: Orthopedics;  Laterality: Left;   KNEE SURGERY     MAZE N/A 04/29/2020   Procedure: MAZE;  Surgeon: Purcell Nails, MD;  Location: [facility] OR;  Service: Open Heart Surgery;  Laterality: N/A;   MITRAL VALVE REPLACEMENT N/A 04/29/2020   Procedure: MITRAL VALVE (MV) REPLACEMENT USING ON-X VALVE SIZE 27/29MM;  Surgeon: Purcell Nails, MD;  Location: [facility] OR;  Service: Open Heart Surgery;  Laterality: N/A;   MULTIPLE EXTRACTIONS WITH ALVEOLOPLASTY N/A 02/28/2020   Procedure: MULTIPLE EXTRACTION WITH ALVEOLOPLASTY;  Surgeon: Sharman Cheek, DMD;  [facility] OR;  Service: Dentistry;  Laterality: N/A;   RIGHT/LEFT HEART CATH AND CORONARY ANGIOGRAPHY Bilateral 09/06/2019   Procedure: RIGHT/LEFT HEART CATH AND  CORONARY ANGIOGRAPHY;  Surgeon: Antonieta Iba, MD;  Location: [facility] INVASIVE CV LAB;  Service: Cardiovascular;  Laterality: Bilateral;   TEE WITHOUT CARDIOVERSION N/A 09/13/2017   Procedure: TRANSESOPHAGEAL ECHOCARDIOGRAM (TEE);  Surgeon: Yvonne Kendall, MD;  Location: [facility] ORS;  Service: Cardiovascular;  Laterality: N/A;   TEE WITHOUT CARDIOVERSION N/A 04/01/2020   Procedure: TRANSESOPHAGEAL ECHOCARDIOGRAM (TEE);  Surgeon: Parke Poisson, MD;  Location: [facility] ENDOSCOPY;  Service: Cardiovascular;  Laterality: N/A;   TEE WITHOUT CARDIOVERSION N/A 04/29/2020   Procedure: TRANSESOPHAGEAL ECHOCARDIOGRAM (TEE);  Surgeon: Purcell Nails, MD;  Location: [facility] OR;  Service: Open Heart Surgery;  Laterality: N/A;   TOTAL KNEE ARTHROPLASTY Left 12/25/2014   Procedure: TOTAL KNEE ARTHROPLASTY;  Surgeon: Juanell Fairly, MD;  Location: [facility] ORS;  Service: Orthopedics;  Laterality: Left;   TUBAL LIGATION       Medical History: Past Medical History:  Diagnosis Date   (HFpEF) heart failure with preserved ejection fraction (HCC)    a. 08/2017 Echo: EF 55-60%.  Grade 2 diastolic dysfunction; b. 05/2020 Echo: EF 50-55%, no rwma, Nl RV fxn, nl fxn'ing mech MV.   Allergy    Anemia    Anxiety    Asthma    BRCA negative 03/22/2013   Bronchitis 02/19/2016   ON LEVAQUIN PO   Chest tightness    a. 08/2019 Cath: nl cors.   Cigarette smoker 09/11/2017   8-10 day   Dysrhythmia    GERD (gastroesophageal reflux disease)    History of kidney stones    Hyperlipidemia    Hypertension    Interstitial lung disease (HCC)    a. CT 2013 b. 02/2018 CXR noted recurrent intersistial changes ILD vs chronic bronchitis   Moderate mitral stenosis    a.  08/2017 TEE: EF 60 to 65%.  Moderate mitral stenosis.  Mean gradient 14 mmHg.  Valve area 2.59 cm by planimetry, 2.72 cm by pressure half-time.   PAF (paroxysmal atrial fibrillation) (HCC)    a. 08/2017 s/p TEE/DCCV; b. CHA2DS2VASc = 2-->warfarin.   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 04/2020 s/p 27/29 mm Onyx mech mitral valve-->chronic coumadin; b. 05/2020 Echo: Nl fxn'ing mech MV.    Family History: Family History  Problem Relation Age of Onset   Cancer Mother 62       breast   Hypertension Mother    Cancer Maternal Aunt        breast   Cancer Maternal Grandmother        breast   Osteoarthritis Father    Hypertension Father       Review of Systems  Constitutional:  Negative for chills, fatigue and unexpected weight change.  HENT:  Negative for congestion, rhinorrhea, sneezing and sore throat.   Eyes:  Negative for redness.  Respiratory:  Negative for cough, chest tightness and shortness of breath.   Cardiovascular:   Negative for chest pain and palpitations.  Gastrointestinal:  Positive for abdominal pain. Negative for constipation, diarrhea, nausea and vomiting.  Genitourinary:  Negative for dysuria and frequency.  Musculoskeletal:  Negative for arthralgias, back pain, joint swelling and neck pain.  Skin:  Negative for rash.  Neurological: Negative.  Negative for tremors and numbness.  Hematological:  Negative for adenopathy. Does not bruise/bleed easily.  Psychiatric/Behavioral:  Negative for behavioral  problems (Depression), sleep disturbance and suicidal ideas. The patient is not nervous/anxious.      Vital Signs: BP 130/80   Pulse 77   Temp 97.8 F (36.6 C)   Resp 16   Ht 5\' 4"  (1.626 m)   Wt 244 lb (110.7 kg)   SpO2 98%   BMI 41.88 kg/m    Physical Exam Vitals reviewed.  Constitutional:      General: She is not in acute distress.    Appearance: Normal appearance. She is obese. She is not ill-appearing.  HENT:     Head: Normocephalic and atraumatic.  Eyes:     Pupils: Pupils are equal, round, and reactive to light.  Cardiovascular:     Rate and Rhythm: Normal rate and regular rhythm.  Pulmonary:     Effort: Pulmonary effort is normal. No respiratory distress.  Abdominal:     Palpations: Abdomen is soft.  Musculoskeletal:        General: Normal range of motion.  Skin:    General: Skin is warm and dry.  Neurological:     Mental Status: She is alert and oriented to person, place, and time.  Psychiatric:        Mood and Affect: Mood normal.        Behavior: Behavior normal.      LABS: Recent Results (from the past 2160 hour(s))  POCT Urinalysis Dipstick     Status: None   Collection Time: 04/05/22  1:21 PM  Result Value Ref Range   Color, UA     Clarity, UA     Glucose, UA Negative Negative   Bilirubin, UA Negative    Ketones, UA Small    Spec Grav, UA 1.010 1.010 - 1.025   Blood, UA Negative    pH, UA 5.0 5.0 - 8.0   Protein, UA Negative Negative    Urobilinogen, UA 0.2 0.2 or 1.0 E.U./dL   Nitrite, UA Negative    Leukocytes, UA Negative Negative   Appearance     Odor    Urine Culture (for pregnant, neutropenic or urologic patients or patients with an indwelling urinary catheter)     Status: Abnormal   Collection Time: 04/05/22  3:51 PM   Specimen: Urine, Clean Catch  Result Value Ref Range   Specimen Description      URINE, CLEAN CATCH Performed at [facility], [address], [place], [place] [address]    Special Requests      NONE Performed at [facility], [address], [place], [place] [address]    Culture (A)     40,000 COLONIES/mL GROUP B STREP(S.AGALACTIAE)ISOLATED TESTING AGAINST S. AGALACTIAE NOT ROUTINELY PERFORMED DUE TO PREDICTABILITY OF AMP/PEN/VAN SUSCEPTIBILITY. Performed at [facility], [address] [place], [place] [address]    Report Status 04/06/2022 FINAL   Basic metabolic panel     Status: Abnormal   Collection Time: 04/05/22  4:06 PM  Result Value Ref Range   Sodium 140 135 - 145 mmol/L   Potassium 3.8 3.5 - 5.1 mmol/L   Chloride 100 98 - 111 mmol/L   CO2 29 22 - 32 mmol/L   Glucose, Bld 142 (H) 70 - 99 mg/dL    Comment: Glucose reference range applies only to samples taken after fasting for at least 8 hours.   BUN 31 (H) 6 - 20 mg/dL   Creatinine, Ser 2.13 (H) 0.44 - 1.00 mg/dL   Calcium 9.2 8.9 - 08.6 mg/dL   GFR,  Estimated 29 (L) >60 mL/min    Comment: (NOTE) Calculated using the CKD-EPI Creatinine Equation (2021)    Anion gap 11 5 - 15    Comment: Performed at [facility], [address] [place], [place] [address]  CULTURE, URINE COMPREHENSIVE     Status: Abnormal   Collection Time: 04/05/22  4:08 PM   Specimen: Urine   Urine  Result Value Ref Range   Urine Culture, Comprehensive Final report (A)    Organism ID, Bacteria Comment (A)     Comment: Beta hemolytic Streptococcus, group B 3,000 Colonies/mL Penicillin and ampicillin are drugs of  choice for treatment of beta-hemolytic streptococcal infections. Susceptibility testing of penicillins and other beta-lactam agents approved by the FDA for treatment of beta-hemolytic streptococcal infections need not be performed routinely because nonsusceptible isolates are extremely rare in any beta-hemolytic streptococcus and have not been reported for Streptococcus pyogenes (group A). (CLSI)    Organism ID, Bacteria Comment     Comment: Mixed urogenital flora 4,000 Colonies/mL   POCT INR     Status: Normal   Collection Time: 05/05/22  1:05 PM  Result Value Ref Range   INR 2.1 2.0 - 3.0   POC INR    POCT Urinalysis Dipstick     Status: None   Collection Time: 05/06/22  8:33 AM  Result Value Ref Range   Color, UA     Clarity, UA     Glucose, UA Negative Negative   Bilirubin, UA Negative    Ketones, UA Negative    Spec Grav, UA 1.010 1.010 - 1.025   Blood, UA Trace    pH, UA 5.0 5.0 - 8.0   Protein, UA Negative Negative   Urobilinogen, UA 0.2 0.2 or 1.0 E.U./dL   Nitrite, UA Negative    Leukocytes, UA Negative Negative   Appearance     Odor    Comprehensive metabolic panel     Status: Abnormal   Collection Time: 05/06/22  9:47 AM  Result Value Ref Range   Sodium 137 135 - 145 mmol/L   Potassium 3.7 3.5 - 5.1 mmol/L   Chloride 99 98 - 111 mmol/L   CO2 27 22 - 32 mmol/L   Glucose, Bld 111 (H) 70 - 99 mg/dL    Comment: Glucose reference range applies only to samples taken after fasting for at least 8 hours.   BUN 41 (H) 6 - 20 mg/dL   Creatinine, Ser 6.04 (H) 0.44 - 1.00 mg/dL   Calcium 9.2 8.9 - 54.0 mg/dL   Total Protein 8.2 (H) 6.5 - 8.1 g/dL   Albumin 4.2 3.5 - 5.0 g/dL   AST 25 15 - 41 U/L   ALT 28 0 - 44 U/L   Alkaline Phosphatase 117 38 - 126 U/L   Total Bilirubin 0.5 0.3 - 1.2 mg/dL   GFR, Estimated 27 (L) >60 mL/min    Comment: (NOTE) Calculated using the CKD-EPI Creatinine Equation (2021)    Anion gap 11 5 - 15    Comment: Performed at [facility], [address] [place], [place] [address]  CBC with Differential/Platelet     Status: Abnormal   Collection Time: 05/06/22  9:47 AM  Result Value Ref Range   WBC 6.4 4.0 - 10.5 K/uL   RBC 3.99 3.87 - 5.11 MIL/uL   Hemoglobin 11.7 (L) 12.0 - 15.0 g/dL   HCT 14.7 82.9 - 56.2 %   MCV 90.5 80.0 - 100.0 fL   MCH  29.3 26.0 - 34.0 pg   MCHC 32.4 30.0 - 36.0 g/dL   RDW 16.1 09.6 - 04.5 %   Platelets 315 150 - 400 K/uL   nRBC 0.0 0.0 - 0.2 %   Neutrophils Relative % 58 %   Neutro Abs 3.7 1.7 - 7.7 K/uL   Lymphocytes Relative 31 %   Lymphs Abs 2.0 0.7 - 4.0 K/uL   Monocytes Relative 9 %   Monocytes Absolute 0.6 0.1 - 1.0 K/uL   Eosinophils Relative 1 %   Eosinophils Absolute 0.1 0.0 - 0.5 K/uL   Basophils Relative 1 %   Basophils Absolute 0.0 0.0 - 0.1 K/uL   Immature Granulocytes 0 %   Abs Immature Granulocytes 0.02 0.00 - 0.07 K/uL    Comment: Performed at [facility], [address] [place], [place] [address]  Lipase, blood     Status: None   Collection Time: 05/06/22  9:47 AM  Result Value Ref Range   Lipase 28 11 - 51 U/L    Comment: Performed at [facility], [address] [place], [place] [address]  CULTURE, URINE COMPREHENSIVE     Status: None   Collection Time: 05/06/22  3:57 PM   Specimen: Urine   Urine  Result Value Ref Range   Urine Culture, Comprehensive Final report    Organism ID, Bacteria Comment     Comment: Mixed urogenital flora 10,000-25,000 colony forming units per mL   Urinalysis, Routine w reflex microscopic -Urine, Clean Catch     Status: Abnormal   Collection Time: 06/11/22  2:22 PM  Result Value Ref Range   Color, Urine STRAW (A) YELLOW   APPearance CLEAR CLEAR   Specific Gravity, Urine 1.008 1.005 - 1.030   pH 5.0 5.0 - 8.0   Glucose, UA NEGATIVE NEGATIVE mg/dL   Hgb urine dipstick SMALL (A) NEGATIVE   Bilirubin Urine NEGATIVE NEGATIVE   Ketones, ur NEGATIVE NEGATIVE mg/dL   Protein, ur NEGATIVE NEGATIVE  mg/dL   Nitrite NEGATIVE NEGATIVE   Leukocytes,Ua NEGATIVE NEGATIVE   RBC / HPF 0-5 0 - 5 RBC/hpf   WBC, UA 0-5 0 - 5 WBC/hpf   Bacteria, UA RARE (A) NONE SEEN   Squamous Epithelial / HPF 0-5 0 - 5 /HPF   Hyaline Casts, UA PRESENT     Comment: Performed at [facility], [address] [place], [place] [address]  CBC     Status: Abnormal   Collection Time: 06/11/22  2:22 PM  Result Value Ref Range   WBC 7.2 4.0 - 10.5 K/uL   RBC 3.91 3.87 - 5.11 MIL/uL   Hemoglobin 11.3 (L) 12.0 - 15.0 g/dL   HCT 21.3 (L) 08.6 - 57.8 %   MCV 89.0 80.0 - 100.0 fL   MCH 28.9 26.0 - 34.0 pg   MCHC 32.5 30.0 - 36.0 g/dL   RDW 46.9 62.9 - 52.8 %   Platelets 338 150 - 400 K/uL   nRBC 0.0 0.0 - 0.2 %    Comment: Performed at [facility], [address] [place], [place] [address]  Basic metabolic panel     Status: Abnormal   Collection Time: 06/11/22  2:22 PM  Result Value Ref Range   Sodium 134 (L) 135 - 145 mmol/L   Potassium 3.9 3.5 - 5.1 mmol/L   Chloride 103 98 - 111 mmol/L   CO2 26 22 - 32 mmol/L   Glucose, Bld 104 (H) 70 - 99 mg/dL  Comment: Glucose reference range applies only to samples taken after fasting for at least 8 hours.   BUN 29 (H) 6 - 20 mg/dL   Creatinine, Ser 4.40 (H) 0.44 - 1.00 mg/dL   Calcium 8.6 (L) 8.9 - 10.3 mg/dL   GFR, Estimated 25 (L) >60 mL/min    Comment: (NOTE) Calculated using the CKD-EPI Creatinine Equation (2021)    Anion gap 5 5 - 15    Comment: Performed at [facility], [address], [place], [place] [address]  POCT INR     Status: Abnormal   Collection Time: 06/16/22  1:43 PM  Result Value Ref Range   INR 5.6 (A) 2.0 - 3.0   POC INR    UA/M w/rflx Culture, Routine     Status: None   Collection Time: 06/24/22  3:33 PM   Specimen: Urine   Urine  Result Value Ref Range   Specific Gravity, UA 1.009 1.005 - 1.030   pH, UA 5.5 5.0 - 7.5   Color, UA Yellow Yellow   Appearance Ur Clear Clear    Leukocytes,UA Negative Negative   Protein,UA Negative Negative/Trace   Glucose, UA Negative Negative   Ketones, UA Negative Negative   RBC, UA Negative Negative   Bilirubin, UA Negative Negative   Urobilinogen, Ur 0.2 0.2 - 1.0 mg/dL   Nitrite, UA Negative Negative   Microscopic Examination Comment     Comment: Microscopic follows if indicated.   Microscopic Examination See below:     Comment: Microscopic was indicated and was performed.   Urinalysis Reflex Comment     Comment: This specimen will not reflex to a Urine Culture.  Microscopic Examination     Status: None   Collection Time: 06/24/22  3:33 PM   Urine  Result Value Ref Range   WBC, UA None seen 0 - 5 /hpf   RBC, Urine 0-2 0 - 2 /hpf   Epithelial Cells (non renal) 0-10 0 - 10 /hpf   Casts None seen None seen /lpf   Bacteria, UA None seen None seen/Few  Basic metabolic panel     Status: Abnormal   Collection Time: 06/25/22 10:12 AM  Result Value Ref Range   Sodium 139 135 - 145 mmol/L   Potassium 3.9 3.5 - 5.1 mmol/L   Chloride 101 98 - 111 mmol/L   CO2 27 22 - 32 mmol/L   Glucose, Bld 109 (H) 70 - 99 mg/dL    Comment: Glucose reference range applies only to samples taken after fasting for at least 8 hours.   BUN 29 (H) 6 - 20 mg/dL   Creatinine, Ser 5.95 (H) 0.44 - 1.00 mg/dL   Calcium 9.3 8.9 - 63.8 mg/dL   GFR, Estimated 30 (L) >60 mL/min    Comment: (NOTE) Calculated using the CKD-EPI Creatinine Equation (2021)    Anion gap 11 5 - 15    Comment: Performed at [facility], [address] [place], [place] [address]  Lipid panel     Status: Abnormal   Collection Time: 06/25/22 10:12 AM  Result Value Ref Range   Cholesterol 160 0 - 200 mg/dL   Triglycerides 329 (H) <150 mg/dL   HDL 33 (L) >51 mg/dL   Total CHOL/HDL Ratio 4.8 RATIO   VLDL 37 0 - 40 mg/dL   LDL Cholesterol 90 0 - 99 mg/dL    Comment:        Total Cholesterol/HDL:CHD Risk Coronary Heart Disease Risk Table  Men    Women  1/2 Average Risk   3.4   3.3  Average Risk       5.0   4.4  2 X Average Risk   9.6   7.1  3 X Average Risk  23.4   11.0        Use the calculated Patient Ratio above and the CHD Risk Table to determine the patient's CHD Risk.        ATP III CLASSIFICATION (LDL):  <100     mg/dL   Optimal  161-096  mg/dL   Near or Above                    Optimal  130-159  mg/dL   Borderline  045-409  mg/dL   High  >811     mg/dL   Very High Performed at [facility], [address] [place], [place] [address]   Iron and TIBC     Status: None   Collection Time: 06/25/22 10:12 AM  Result Value Ref Range   Iron 68 28 - 170 ug/dL   TIBC 295 621 - 308 ug/dL   Saturation Ratios 19 10.4 - 31.8 %   UIBC 292 ug/dL    Comment: Performed at [facility], [address] [place], [place] [address]  Ferritin     Status: None   Collection Time: 06/25/22 10:12 AM  Result Value Ref Range   Ferritin 289 11 - 307 ng/mL    Comment: Performed at [facility], [address] [place], [place] [address]  VITAMIN D 25 Hydroxy (Vit-D Deficiency, Fractures)     Status: Abnormal   Collection Time: 06/25/22 10:12 AM  Result Value Ref Range   Vit D, 25-Hydroxy 15.29 (L) 30 - 100 ng/mL    Comment: (NOTE) Vitamin D deficiency has been defined by the Institute of Medicine  and an Endocrine Society practice guideline as a level of serum 25-OH  vitamin D less than 20 ng/mL (1,2). The Endocrine Society went on to  further define vitamin D insufficiency as a level between 21 and 29  ng/mL (2).  1. IOM (Institute of Medicine). 2010. Dietary reference intakes for  calcium and D. [place]: [employer]. 2. Holick MF, Binkley Windom, Bischoff-Ferrari HA, et al. Evaluation,  treatment, and prevention of vitamin D deficiency: an Endocrine  Society clinical practice guideline, JCEM. 2011 Jul; 96(7): 1911-30.  Performed at [facility], [address]  [place], [place] [address]   Vitamin B12     Status: None   Collection Time: 06/25/22 10:12 AM  Result Value Ref Range   Vitamin B-12 340 180 - 914 pg/mL    Comment: (NOTE) This assay is not validated for testing neonatal or myeloproliferative syndrome specimens for Vitamin B12 levels. Performed at [facility], [address], [place], [place] [address]   Folate     Status: None   Collection Time: 06/25/22 10:12 AM  Result Value Ref Range   Folate 8.2 >5.9 ng/mL    Comment: Performed at [facility], [address] [place], [place] [address]  TSH     Status: None   Collection Time: 06/25/22 10:12 AM  Result Value Ref Range   TSH 0.629 0.350 - 4.500 uIU/mL    Comment: Performed by a 3rd Generation assay with a functional sensitivity of <=0.01 uIU/mL. Performed at [facility], [address], [place], [place] [address]  T4, free     Status: None   Collection Time: 06/25/22 10:12 AM  Result Value Ref Range   Free T4 0.82 0.61 - 1.12 ng/dL    Comment: (NOTE) Biotin ingestion may interfere with free T4 tests. If the results are inconsistent with the TSH level, previous test results, or the clinical presentation, then consider biotin interference. If needed, order repeat testing after stopping biotin. Performed at [facility], [address] [place], [place] [address]   CBC with Differential/Platelet     Status: Abnormal   Collection Time: 06/25/22 10:12 AM  Result Value Ref Range   WBC 6.4 4.0 - 10.5 K/uL   RBC 4.05 3.87 - 5.11 MIL/uL   Hemoglobin 11.6 (L) 12.0 - 15.0 g/dL   HCT 60.4 (L) 54.0 - 98.1 %   MCV 88.4 80.0 - 100.0 fL   MCH 28.6 26.0 - 34.0 pg   MCHC 32.4 30.0 - 36.0 g/dL   RDW 19.1 47.8 - 29.5 %   Platelets 360 150 - 400 K/uL   nRBC 0.0 0.0 - 0.2 %   Neutrophils Relative % 56 %   Neutro Abs 3.7 1.7 - 7.7 K/uL   Lymphocytes Relative 32 %   Lymphs Abs 2.0 0.7 - 4.0 K/uL   Monocytes Relative 9 %   Monocytes Absolute 0.6 0.1  - 1.0 K/uL   Eosinophils Relative 2 %   Eosinophils Absolute 0.1 0.0 - 0.5 K/uL   Basophils Relative 1 %   Basophils Absolute 0.0 0.0 - 0.1 K/uL   Immature Granulocytes 0 %   Abs Immature Granulocytes 0.01 0.00 - 0.07 K/uL    Comment: Performed at [facility], [address], [place], [place] [address]        Assessment/Plan: 1. Encounter for general adult medical examination with abnormal findings CPE performed, routine labs ordered, mammogram ordered  2. Type 2 diabetes mellitus with hyperglycemia, without long-term current use of insulin (HCC) Continue current medications and monitoring sugar  3. Primary hypertension Stable, continue current medications and following with cardiology as planned  4. Pain and swelling of left ankle Will have him check x-ray and treat accordingly - DG Ankle Complete Left; Future  5. Epigastric pain - Ambulatory referral to Gastroenterology  6. Abdominal bloating with cramps - Ambulatory referral to Gastroenterology  7. Vitamin D deficiency - VITAMIN D 25 Hydroxy (Vit-D Deficiency, Fractures)  8. B12 deficiency - B12 and Folate Panel  9. Mixed hyperlipidemia - Lipid Panel With LDL/HDL Ratio  10. Abnormal thyroid exam - TSH + free T4  11. Other fatigue - CBC w/Diff/Platelet - TSH + free T4 - B12 and Folate Panel - VITAMIN D 25 Hydroxy (Vit-D Deficiency, Fractures) - Fe+TIBC+Fer - Lipid Panel With LDL/HDL Ratio  12. Hypokalemia - Basic Metabolic Panel (BMET)  13. Hyponatremia - Basic Metabolic Panel (BMET)  14. Visit for screening mammogram - MM 3D SCREENING MAMMOGRAM BILATERAL BREAST; Future  15. Dysuria - UA/M w/rflx Culture, Routine - Microscopic Examination   General Counseling: Cheryel verbalizes understanding of the findings of todays visit and agrees with plan of treatment. I have discussed any further diagnostic evaluation that may be needed or ordered today. We also reviewed her medications today.  she has been encouraged to call the office with any questions or concerns that should arise related to todays visit.    Counseling:    Orders Placed This Encounter  Procedures   Microscopic Examination   MM 3D SCREENING MAMMOGRAM BILATERAL BREAST  DG Ankle Complete Left   UA/M w/rflx Culture, Routine   CBC w/Diff/Platelet   TSH + free T4   B12 and Folate Panel   VITAMIN D 25 Hydroxy (Vit-D Deficiency, Fractures)   Fe+TIBC+Fer   Lipid Panel With LDL/HDL Ratio   Basic Metabolic Panel (BMET)   Ambulatory referral to Gastroenterology    No orders of the defined types were placed in this encounter.   This patient was seen by Lynn Ito, PA-C in collaboration with Dr. Beverely Risen as a part of collaborative care agreement.  Total time spent:35 Minutes  Time spent includes review of chart, medications, test results, and follow up plan with the patient.     Lyndon Code, MD  Internal Medicine

## 2022-06-25 ENCOUNTER — Encounter: Payer: Self-pay | Admitting: Internal Medicine

## 2022-06-25 ENCOUNTER — Ambulatory Visit
Admission: RE | Admit: 2022-06-25 | Discharge: 2022-06-25 | Disposition: A | Discharge status: Home / Self Care | Payer: 59 | Source: Ambulatory Visit | Attending: Physician Assistant | Admitting: Physician Assistant

## 2022-06-25 ENCOUNTER — Ambulatory Visit
Admission: RE | Admit: 2022-06-25 | Discharge: 2022-06-25 | Disposition: A | Discharge status: Home / Self Care | Payer: 59 | Attending: Physician Assistant | Admitting: Physician Assistant

## 2022-06-25 ENCOUNTER — Other Ambulatory Visit
Admission: RE | Admit: 2022-06-25 | Discharge: 2022-06-25 | Disposition: A | Discharge status: Home / Self Care | Payer: 59 | Source: Home / Self Care | Attending: Physician Assistant | Admitting: Physician Assistant

## 2022-06-25 DIAGNOSIS — M25572 Pain in left ankle and joints of left foot: Secondary | ICD-10-CM | POA: Insufficient documentation

## 2022-06-25 DIAGNOSIS — M7989 Other specified soft tissue disorders: Secondary | ICD-10-CM | POA: Diagnosis not present

## 2022-06-25 DIAGNOSIS — M25472 Effusion, left ankle: Secondary | ICD-10-CM | POA: Diagnosis not present

## 2022-06-25 DIAGNOSIS — R1013 Epigastric pain: Secondary | ICD-10-CM | POA: Insufficient documentation

## 2022-06-25 DIAGNOSIS — E876 Hypokalemia: Secondary | ICD-10-CM | POA: Insufficient documentation

## 2022-06-25 LAB — BASIC METABOLIC PANEL
Anion gap: 11 (ref 5–15)
BUN: 29 mg/dL — ABNORMAL HIGH (ref 6–20)
CO2: 27 mmol/L (ref 22–32)
Calcium: 9.3 mg/dL (ref 8.9–10.3)
Chloride: 101 mmol/L (ref 98–111)
Creatinine, Ser: 1.93 mg/dL — ABNORMAL HIGH (ref 0.44–1.00)
GFR, Estimated: 30 mL/min — ABNORMAL LOW (ref >60)
Glucose, Bld: 109 mg/dL — ABNORMAL HIGH (ref 70–99)
Potassium: 3.9 mmol/L (ref 3.5–5.1)
Sodium: 139 mmol/L (ref 135–145)

## 2022-06-25 LAB — MICROSCOPIC EXAMINATION
Bacteria, UA: NONE SEEN
Casts: NONE SEEN /lpf
WBC, UA: NONE SEEN /hpf (ref 0–5)

## 2022-06-25 LAB — UA/M W/RFLX CULTURE, ROUTINE
Bilirubin, UA: NEGATIVE
Glucose, UA: NEGATIVE
Ketones, UA: NEGATIVE
Leukocytes,UA: NEGATIVE
Nitrite, UA: NEGATIVE
Protein,UA: NEGATIVE
RBC, UA: NEGATIVE
Specific Gravity, UA: 1.009 (ref 1.005–1.030)
Urobilinogen, Ur: 0.2 mg/dL (ref 0.2–1.0)
pH, UA: 5.5 (ref 5.0–7.5)

## 2022-06-25 LAB — CBC WITH DIFFERENTIAL/PLATELET
Abs Immature Granulocytes: 0.01 10*3/uL (ref 0.00–0.07)
Basophils Absolute: 0 10*3/uL (ref 0.0–0.1)
Basophils Relative: 1 %
Eosinophils Absolute: 0.1 10*3/uL (ref 0.0–0.5)
Eosinophils Relative: 2 %
HCT: 35.8 % — ABNORMAL LOW (ref 36.0–46.0)
Hemoglobin: 11.6 g/dL — ABNORMAL LOW (ref 12.0–15.0)
Immature Granulocytes: 0 %
Lymphocytes Relative: 32 %
Lymphs Abs: 2 10*3/uL (ref 0.7–4.0)
MCH: 28.6 pg (ref 26.0–34.0)
MCHC: 32.4 g/dL (ref 30.0–36.0)
MCV: 88.4 fL (ref 80.0–100.0)
Monocytes Absolute: 0.6 10*3/uL (ref 0.1–1.0)
Monocytes Relative: 9 %
Neutro Abs: 3.7 10*3/uL (ref 1.7–7.7)
Neutrophils Relative %: 56 %
Platelets: 360 10*3/uL (ref 150–400)
RBC: 4.05 MIL/uL (ref 3.87–5.11)
RDW: 12.2 % (ref 11.5–15.5)
WBC: 6.4 10*3/uL (ref 4.0–10.5)
nRBC: 0 % (ref 0.0–0.2)

## 2022-06-25 LAB — LIPID PANEL
Cholesterol: 160 mg/dL (ref 0–200)
HDL: 33 mg/dL — ABNORMAL LOW (ref >40)
LDL Cholesterol: 90 mg/dL (ref 0–99)
Total CHOL/HDL Ratio: 4.8 RATIO
Triglycerides: 185 mg/dL — ABNORMAL HIGH (ref <150)
VLDL: 37 mg/dL (ref 0–40)

## 2022-06-25 LAB — IRON AND TIBC
Iron: 68 ug/dL (ref 28–170)
Saturation Ratios: 19 % (ref 10.4–31.8)
TIBC: 360 ug/dL (ref 250–450)
UIBC: 292 ug/dL

## 2022-06-25 LAB — T4, FREE: Free T4: 0.82 ng/dL (ref 0.61–1.12)

## 2022-06-25 LAB — VITAMIN D 25 HYDROXY (VIT D DEFICIENCY, FRACTURES): Vit D, 25-Hydroxy: 15.29 ng/mL — ABNORMAL LOW (ref 30–100)

## 2022-06-25 LAB — TSH: TSH: 0.629 u[IU]/mL (ref 0.350–4.500)

## 2022-06-25 LAB — FERRITIN: Ferritin: 289 ng/mL (ref 11–307)

## 2022-06-25 LAB — VITAMIN B12: Vitamin B-12: 340 pg/mL (ref 180–914)

## 2022-06-25 LAB — FOLATE: Folate: 8.2 ng/mL (ref >5.9)

## 2022-06-28 ENCOUNTER — Other Ambulatory Visit: Payer: Self-pay | Admitting: Physician Assistant

## 2022-06-28 ENCOUNTER — Other Ambulatory Visit: Payer: Self-pay | Admitting: Cardiovascular Disease

## 2022-06-28 DIAGNOSIS — I5032 Chronic diastolic (congestive) heart failure: Secondary | ICD-10-CM

## 2022-06-28 DIAGNOSIS — Z954 Presence of other heart-valve replacement: Secondary | ICD-10-CM

## 2022-06-28 DIAGNOSIS — I050 Rheumatic mitral stenosis: Secondary | ICD-10-CM

## 2022-06-29 ENCOUNTER — Telehealth: Payer: Self-pay

## 2022-06-29 ENCOUNTER — Ambulatory Visit: Payer: Medicaid Other

## 2022-06-29 MED ORDER — ERGOCALCIFEROL 1.25 MG (50000 UT) PO CAPS: 50000.0000 [IU] | ORAL_CAPSULE | ORAL | 1 refills | Status: DC

## 2022-06-29 NOTE — Telephone Encounter (Signed)
-----   Message from Carlean Jews, PA-C sent at 06/29/2022 12:41 PM EDT ----- Please let her know her Vit D is low and send drisdol. B12 low and can start injections. Other wise labs overall stable

## 2022-06-29 NOTE — Telephone Encounter (Signed)
Left message for patient regarding lab results. 

## 2022-07-02 ENCOUNTER — Telehealth: Payer: Self-pay | Admitting: Physician Assistant

## 2022-07-02 NOTE — Telephone Encounter (Signed)
Received disability form from [place]. Gave to Lauren to complete-Toni

## 2022-07-05 ENCOUNTER — Telehealth: Payer: Self-pay | Admitting: Cardiovascular Disease

## 2022-07-05 ENCOUNTER — Telehealth: Payer: Self-pay | Admitting: Physician Assistant

## 2022-07-05 ENCOUNTER — Ambulatory Visit (INDEPENDENT_AMBULATORY_CARE_PROVIDER_SITE_OTHER): Payer: Medicaid Other

## 2022-07-05 DIAGNOSIS — E538 Deficiency of other specified B group vitamins: Secondary | ICD-10-CM

## 2022-07-05 MED ORDER — CYANOCOBALAMIN 1000 MCG/ML IJ SOLN
1000.0000 ug | Freq: Once | INTRAMUSCULAR | Status: AC
  Administered 2022-07-05: 1000 ug via INTRAMUSCULAR

## 2022-07-05 MED ORDER — METOPROLOL SUCCINATE ER 100 MG PO TB24: ORAL_TABLET | ORAL | 0 refills | Status: DC

## 2022-07-05 NOTE — Telephone Encounter (Signed)
Called patient regarding disability forms, she stated not to complete. She is no longer with [facility]. Not sure why they keep sending-Toni

## 2022-07-05 NOTE — Telephone Encounter (Signed)
*  STAT* If patient is at the pharmacy, call can be transferred to refill team.   1. Which medications need to be refilled? (please list name of each medication and dose if known) metoprolol succinate (TOPROL-XL) 100 MG 24 hr tablet   2. Which pharmacy/location (including street and city if local pharmacy) is medication to be sent to?   [id number] [address], [place] [address] Phone: [phone]  Fax: [phone]      3. Do they need a 30 day or 90 day supply? 90

## 2022-07-05 NOTE — Telephone Encounter (Signed)
Requested Prescriptions   Signed Prescriptions Disp Refills   metoprolol succinate (TOPROL-XL) 100 MG 24 hr tablet 180 tablet 0    Sig: TAKE 1 TABLET(100 MG) BY MOUTH TWICE DAILY WITH OR IMMEDIATELY FOLLOWING A MEAL    Authorizing Provider: Antonieta Iba    Ordering User: Thayer Headings, Anett Ranker L

## 2022-07-06 ENCOUNTER — Ambulatory Visit (INDEPENDENT_AMBULATORY_CARE_PROVIDER_SITE_OTHER): Payer: 59 | Admitting: Nurse Practitioner

## 2022-07-06 ENCOUNTER — Telehealth: Payer: Self-pay

## 2022-07-06 ENCOUNTER — Encounter: Payer: Self-pay | Admitting: Nurse Practitioner

## 2022-07-06 VITALS — BP 130/65 | HR 91 | Temp 98.4°F | Resp 16 | Ht 64.0 in | Wt 243.2 lb

## 2022-07-06 DIAGNOSIS — R300 Dysuria: Secondary | ICD-10-CM | POA: Diagnosis not present

## 2022-07-06 DIAGNOSIS — N3001 Acute cystitis with hematuria: Secondary | ICD-10-CM | POA: Diagnosis not present

## 2022-07-06 DIAGNOSIS — B3731 Acute candidiasis of vulva and vagina: Secondary | ICD-10-CM

## 2022-07-06 LAB — POCT URINALYSIS DIPSTICK
Bilirubin, UA: NEGATIVE
Glucose, UA: NEGATIVE
Ketones, UA: POSITIVE
Nitrite, UA: NEGATIVE
Protein, UA: POSITIVE — AB
Spec Grav, UA: 1.01 (ref 1.010–1.025)
Urobilinogen, UA: 0.2 E.U./dL
pH, UA: 5 (ref 5.0–8.0)

## 2022-07-06 MED ORDER — NITROFURANTOIN MONOHYD MACRO 100 MG PO CAPS
100.0000 mg | ORAL_CAPSULE | Freq: Two times a day (BID) | ORAL | 0 refills | Status: AC
Start: 2022-07-06 — End: 2022-07-13

## 2022-07-06 MED ORDER — FLUCONAZOLE 150 MG PO TABS
150.0000 mg | ORAL_TABLET | Freq: Once | ORAL | 0 refills | Status: AC
Start: 2022-07-06 — End: 2022-07-06

## 2022-07-06 NOTE — Telephone Encounter (Signed)
Spoke with patient regarding ankle xray.

## 2022-07-06 NOTE — Telephone Encounter (Signed)
-----   Message from Carlean Jews, PA-C sent at 07/05/2022  4:18 PM EDT ----- Please let her know that her xray did not show any acute abnormalities. She has a few small spurs.

## 2022-07-06 NOTE — Progress Notes (Signed)
[facility] [address] [place], [place] [address]  Internal MEDICINE  Office Visit Note  Patient Name: Leslie [name]  [id number]  [id number]  Date of Service: 07/06/2022  Chief Complaint  Patient presents with   Acute Visit    uti     HPI Leslie [name] presents for an acute sick visit for possible UTI and yeast infection.  --onset: 3 days ago Urgency, frequency, back pain and flank pain, suprapubic tenderness, urinary discomfort and pain with urination, vaginal itching.     Current Medication:  Outpatient Encounter Medications as of 07/06/2022  Medication Sig   ALPRAZolam (XANAX) 0.5 MG tablet TAKE 1/2 TABLET BY MOUTH AT BEDTIME AS NEEDED FOR ANXIETY   amLODipine (NORVASC) 5 MG tablet Take 5 mg by mouth daily.   ampicillin (PRINCIPEN) 500 MG capsule Take 1 capsule (500 mg total) by mouth 3 (three) times daily.   aspirin 81 MG EC tablet Take 81 mg by mouth daily.   atorvastatin (LIPITOR) 80 MG tablet Take 1 tablet (80 mg total) by mouth daily.   calcium carbonate (TUMS - DOSED IN MG ELEMENTAL CALCIUM) 500 MG chewable tablet Chew 500 mg by mouth daily as needed for indigestion or heartburn.   chlorpheniramine-HYDROcodone (TUSSIONEX) 10-8 MG/5ML Take 5 mLs by mouth every 12 (twelve) hours as needed.   dicyclomine (BENTYL) 20 MG tablet Take 1 tablet (20 mg total) by mouth 4 (four) times daily -  before meals and at bedtime.   diltiazem (CARDIZEM) 60 MG tablet Take 1 tablet (60 mg total) by mouth 3 (three) times daily as needed (for tachycardia).   dronedarone (MULTAQ) 400 MG tablet Take 1 tablet (400 mg total) by mouth 2 (two) times daily with a meal.   ergocalciferol (DRISDOL) 1.25 MG (50000 UT) capsule Take 1 capsule (50,000 Units total) by mouth once a week.   ferrous fumarate-b12-vitamic C-folic acid (FEROCON) capsule TAKE 1 CAPSULE BY MOUTH TWICE A DAY AFTER MEALS   ferrous sulfate 325 (65 FE) MG EC tablet Take 325 mg by mouth in the morning and at bedtime.    fluconazole (DIFLUCAN) 150 MG tablet Take 1 tablet (150 mg total) by mouth once for 1 dose. May take an additional dose after 3 days if still symptomatic.   Insulin Pen Needle 32G X 6 MM MISC Use with victoza   liraglutide (VICTOZA) 18 MG/3ML SOPN Start 0.6mg  SQ once a day for 7 days, then increase to 1.2mg  once a day   losartan (COZAAR) 50 MG tablet Take 50 mg by mouth daily.   metoprolol succinate (TOPROL-XL) 100 MG 24 hr tablet TAKE 1 TABLET(100 MG) BY MOUTH TWICE DAILY WITH OR IMMEDIATELY FOLLOWING A MEAL   nitrofurantoin, macrocrystal-monohydrate, (MACROBID) 100 MG capsule Take 1 capsule (100 mg total) by mouth 2 (two) times daily for 7 days. Take with food.   omeprazole (PRILOSEC) 40 MG capsule TAKE 1 CAPSULE BY MOUTH DAILY   potassium chloride SA (KLOR-CON M20) 20 MEQ tablet TAKE 2 TABLETS BY MOUTH TWICE A DAY   spironolactone (ALDACTONE) 25 MG tablet Take 1 tablet (25 mg total) by mouth daily.   sucralfate (CARAFATE) 1 g tablet Take 1 tablet (1 g total) by mouth 4 (four) times daily -  with meals and at bedtime.   torsemide (DEMADEX) 20 MG tablet TAKE 1 TABLET BY MOUTH TWICE A DAY   warfarin (COUMADIN) 2 MG tablet TAKE 1/2 TO 1 TABLET BY MOUTH DAILY AS DIRECTED BY THE ANTI-COAG CLINIC (Patient taking differently: Take 1-2  mg by mouth See admin instructions. Take 2 mg every day every day except on Sunday Thursday take 1 mg per patient)   [DISCONTINUED] albuterol (VENTOLIN HFA) 108 (90 Base) MCG/ACT inhaler INHALE 2 PUFFS INTO THE LUNGS EVERY 6 HOURS AS NEEDED FOR WHEEZING OR SHORTNESS OF BREATH   No facility-administered encounter medications on file as of 07/06/2022.      Medical History: Past Medical History:  Diagnosis Date   (HFpEF) heart failure with preserved ejection fraction (HCC)    a. 08/2017 Echo: EF 55-60%.  Grade 2 diastolic dysfunction; b. 05/2020 Echo: EF 50-55%, no rwma, Nl RV fxn, nl fxn'ing mech MV.   Allergy    Anemia    Anxiety    Asthma    BRCA negative 03/22/2013    Bronchitis 02/19/2016   ON LEVAQUIN PO   Chest tightness    a. 08/2019 Cath: nl cors.   Cigarette smoker 09/11/2017   8-10 day   Dysrhythmia    GERD (gastroesophageal reflux disease)    History of kidney stones    Hyperlipidemia    Hypertension    Interstitial lung disease (HCC)    a. CT 2013 b. 02/2018 CXR noted recurrent intersistial changes ILD vs chronic bronchitis   Moderate mitral stenosis    a.  08/2017 TEE: EF 60 to 65%.  Moderate mitral stenosis.  Mean gradient 14 mmHg.  Valve area 2.59 cm by planimetry, 2.72 cm by pressure half-time.   PAF (paroxysmal atrial fibrillation) (HCC)    a. 08/2017 s/p TEE/DCCV; b. CHA2DS2VASc = 2-->warfarin.   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 04/2020 s/p 27/29 mm Onyx mech mitral valve-->chronic coumadin; b. 05/2020 Echo: Nl fxn'ing mech MV.     Vital Signs: BP 130/65 Comment: 150/74  Pulse 91   Temp 98.4 F (36.9 C)   Resp 16   Ht 5\' 4"  (1.626 m)   Wt 243 lb 3.2 oz (110.3 kg)   SpO2 96%   BMI 41.75 kg/m    Review of Systems  Constitutional:  Positive for fatigue.  Respiratory: Negative.  Negative for cough, chest tightness, shortness of breath and wheezing.   Cardiovascular: Negative.  Negative for chest pain and palpitations.  Gastrointestinal: Negative.   Genitourinary:  Positive for difficulty urinating, dysuria, flank pain, frequency, hematuria, urgency and vaginal pain.       Vaginal itching  Musculoskeletal:  Positive for back pain.    Physical Exam Vitals reviewed.  Constitutional:      General: She is not in acute distress.    Appearance: Normal appearance. She is obese. She is not ill-appearing.  HENT:     Head: Normocephalic and atraumatic.  Eyes:     Pupils: Pupils are equal,  round, and reactive to light.  Cardiovascular:     Rate and Rhythm: Normal rate and regular rhythm.  Pulmonary:     Effort: Pulmonary effort is normal. No respiratory distress.  Abdominal:     Tenderness: There is abdominal tenderness in the suprapubic area.  Neurological:     Mental Status: She is alert and oriented to person, place, and time.  Psychiatric:        Mood and Affect: Mood normal.        Behavior: Behavior normal.  Assessment/Plan: 1. Acute cystitis with hematuria Urinalysis psotive for large blood and leukocytes, urine culture sent, macrobid prescribed.  - POCT urinalysis dipstick - CULTURE, URINE COMPREHENSIVE - nitrofurantoin, macrocrystal-monohydrate, (MACROBID) 100 MG capsule; Take 1 capsule (100 mg total) by mouth 2 (two) times daily for 7 days. Take with food.  Dispense: 14 capsule; Refill: 0  2. Vulvovaginal candidiasis Fluconazole prescribed.  - fluconazole (DIFLUCAN) 150 MG tablet; Take 1 tablet (150 mg total) by mouth once for 1 dose. May take an additional dose after 3 days if still symptomatic.  Dispense: 3 tablet; Refill: 0  3. Dysuria Urinalysis positive for large blood and leukocytes, urine culture sent  - POCT urinalysis dipstick - CULTURE, URINE COMPREHENSIVE   General Counseling: Leslie [name] verbalizes understanding of the findings of todays visit and agrees with plan of treatment. I have discussed any further diagnostic evaluation that may be needed or ordered today. We also reviewed her medications today. she has been encouraged to call the office with any questions or concerns that should arise related to todays visit.    Counseling:    Orders Placed This Encounter  Procedures   CULTURE, URINE COMPREHENSIVE   POCT urinalysis dipstick    Meds ordered this encounter  Medications   fluconazole (DIFLUCAN) 150 MG tablet    Sig: Take 1 tablet (150 mg total) by mouth once for 1 dose. May take an additional dose after 3 days if still  symptomatic.    Dispense:  3 tablet    Refill:  0   nitrofurantoin, macrocrystal-monohydrate, (MACROBID) 100 MG capsule    Sig: Take 1 capsule (100 mg total) by mouth 2 (two) times daily for 7 days. Take with food.    Dispense:  14 capsule    Refill:  0    Return if symptoms worsen or fail to improve.  [place] Controlled Substance Database was reviewed by me for overdose risk score (ORS)  Time spent:30 Minutes Time spent with patient included reviewing progress notes, labs, imaging studies, and discussing plan for follow up.   This patient was seen by Sallyanne Kuster, FNP-C in collaboration with Dr. Beverely Risen as a part of collaborative care agreement.  Jya Hughston R. Tedd Sias, MSN, FNP-C Internal Medicine

## 2022-07-07 ENCOUNTER — Ambulatory Visit: Payer: 59 | Attending: Cardiovascular Disease

## 2022-07-07 DIAGNOSIS — Z5181 Encounter for therapeutic drug level monitoring: Secondary | ICD-10-CM | POA: Diagnosis not present

## 2022-07-07 DIAGNOSIS — I4891 Unspecified atrial fibrillation: Secondary | ICD-10-CM | POA: Diagnosis not present

## 2022-07-07 DIAGNOSIS — I050 Rheumatic mitral stenosis: Secondary | ICD-10-CM

## 2022-07-07 LAB — POCT INR: INR: 2.4 (ref 2.0–3.0)

## 2022-07-07 NOTE — Patient Instructions (Signed)
continue 1 tablet daily except 1/2 tablet on Mondays and Fridays. Stay consistent with greens. Since taking antibiotic eat greens Thursday, Saturday and Monday - Recheck INR in 2 weeks

## 2022-07-09 LAB — CULTURE, URINE COMPREHENSIVE

## 2022-07-12 ENCOUNTER — Ambulatory Visit (INDEPENDENT_AMBULATORY_CARE_PROVIDER_SITE_OTHER): Payer: Medicaid Other

## 2022-07-12 DIAGNOSIS — E538 Deficiency of other specified B group vitamins: Secondary | ICD-10-CM

## 2022-07-12 MED ORDER — CYANOCOBALAMIN 1000 MCG/ML IJ SOLN
1000.0000 ug | Freq: Once | INTRAMUSCULAR | Status: AC
  Administered 2022-07-12: 1000 ug via INTRAMUSCULAR

## 2022-07-13 ENCOUNTER — Telehealth: Payer: Self-pay | Admitting: Physician Assistant

## 2022-07-13 NOTE — Telephone Encounter (Signed)
Received call from representative with [employer] regarding office notes for disability thru [facility]. I explained to him that patient does not want [place] to send them MR since she no longer works for [employer] and is not filing for disability-Leslie [name]

## 2022-07-19 ENCOUNTER — Ambulatory Visit (INDEPENDENT_AMBULATORY_CARE_PROVIDER_SITE_OTHER): Payer: Medicaid Other

## 2022-07-19 ENCOUNTER — Other Ambulatory Visit: Payer: Self-pay | Admitting: Physician Assistant

## 2022-07-19 DIAGNOSIS — E538 Deficiency of other specified B group vitamins: Secondary | ICD-10-CM | POA: Diagnosis not present

## 2022-07-19 MED ORDER — CYANOCOBALAMIN 1000 MCG/ML IJ SOLN
1000.0000 ug | Freq: Once | INTRAMUSCULAR | Status: AC
Start: 2022-07-19 — End: 2022-07-19
  Administered 2022-07-19: 1000 ug via INTRAMUSCULAR

## 2022-07-21 ENCOUNTER — Ambulatory Visit: Payer: 59 | Attending: Cardiovascular Disease

## 2022-07-21 DIAGNOSIS — I050 Rheumatic mitral stenosis: Secondary | ICD-10-CM

## 2022-07-21 DIAGNOSIS — Z5181 Encounter for therapeutic drug level monitoring: Secondary | ICD-10-CM

## 2022-07-21 DIAGNOSIS — I4891 Unspecified atrial fibrillation: Secondary | ICD-10-CM | POA: Diagnosis not present

## 2022-07-21 LAB — POCT INR: INR: 2.4 (ref 2.0–3.0)

## 2022-07-21 NOTE — Patient Instructions (Signed)
TAKE 1.5 TABLETS TODAY ONLY THEN continue 1 tablet daily except 1/2 tablet on Mondays and Fridays. Stay consistent with greens.  - Recheck INR in 4 weeks

## 2022-07-27 ENCOUNTER — Encounter: Payer: Self-pay | Admitting: Internal Medicine

## 2022-07-29 ENCOUNTER — Ambulatory Visit (INDEPENDENT_AMBULATORY_CARE_PROVIDER_SITE_OTHER): Payer: 59 | Admitting: Physician Assistant

## 2022-07-29 ENCOUNTER — Encounter: Payer: Self-pay | Admitting: Physician Assistant

## 2022-07-29 VITALS — BP 132/77 | HR 72 | Temp 98.5°F | Ht 64.0 in | Wt 246.0 lb

## 2022-07-29 DIAGNOSIS — K219 Gastro-esophageal reflux disease without esophagitis: Secondary | ICD-10-CM | POA: Diagnosis not present

## 2022-07-29 DIAGNOSIS — R140 Abdominal distension (gaseous): Secondary | ICD-10-CM

## 2022-07-29 DIAGNOSIS — R1013 Epigastric pain: Secondary | ICD-10-CM

## 2022-07-29 MED ORDER — SUCRALFATE 1 G PO TABS: 1.0000 g | ORAL_TABLET | Freq: Two times a day (BID) | ORAL | 5 refills | Status: DC

## 2022-07-29 NOTE — Patient Instructions (Signed)
For GERD and gastritis:  Start Carafate 1 g 1 tablet twice daily before meals. Continue omeprazole 40 mg 1 tablet once daily 30 minutes before a meal.  For bloating:  Start align probiotic 1 capsule once daily.  For Constipation: Recommend High Fiber diet with fruits, vegetables, and whole grains. Drink 64 ounces of Fluids Daily. Start Miralax Mix 1 capful in a drink daily. Continue Colace (docusate sodium) 100mg  1 capsule once daily. If no improvement, consider Linzess, Amitiza or Trulance.

## 2022-07-29 NOTE — Progress Notes (Signed)
[name], PA-C [address]  [address]  [place], [place] [address]  Main: [phone]  Fax: [phone]   Primary Care Physician: Carlean Jews, PA-C  Primary Gastroenterologist:  Dr. Midge Minium / [name], PA-C   CC: Epigastric pain, abdominal bloating and cramping  HPI: Leslie [name] is a 57 y.o. female presents  for followup. Patient saw Dr. Servando Snare 06/2021 for heme positive stool and CT which showed colitis.  She was having melena and normal hemoglobin.  Previous cholecystectomy.  She was scheduled for EGD / Colon.  She is on Coumadin and required Lovenox bridging prior to procedures.  She has mechanical mitral valve.  EGD 07/16/2021 by Dr. Servando Snare showed medium hiatal hernia, 1 benign appearing intrinsic mild stenosis at the GE junction dilated to 18 mm.  Normal stomach and duodenum.  Colonoscopy 07/16/2021 showed small nonbleeding internal hemorrhoids, otherwise normal.  Good prep.  No polyps.  10-year repeat.  Current symptoms: Patient reports having generalized upper abdominal pain with abdominal bloating all across her upper abdomen.  Starts in the epigastrium and radiates to the right and left upper abdomen.  Has had some nausea but no vomiting.  Takes OTC stool softener for chronic constipation.  She takes omeprazole 40 mg 1 tablet once daily approximately 3 days/week.  Occasional Tums as needed.  Labs 06/25/2022 showed mild chronic stable anemia with hemoglobin 11.6, hematocrit 35, MCV 88.  Stable chronic kidney disease with BUN 29, creatinine 1.93, GFR 30.  Normal iron studies.  Low normal normal vitamin B12 (340).  Normal TSH and folate.  Labs 04/2022 showed normal LFTs.  CT abdomen pelvis without contrast 06/11/2022 showed no acute abnormality.  Unchanged right kidney stone and simple kidney cyst.  Small hiatal hernia.    Current Outpatient Medications  Medication Sig Dispense Refill   ALPRAZolam (XANAX) 0.5 MG tablet TAKE 1/2 TABLET BY MOUTH AT BEDTIME AS  NEEDED FOR ANXIETY 30 tablet 2   amLODipine (NORVASC) 5 MG tablet Take 5 mg by mouth daily.     ampicillin (PRINCIPEN) 500 MG capsule Take 1 capsule (500 mg total) by mouth 3 (three) times daily. 15 capsule 0   aspirin 81 MG EC tablet Take 81 mg by mouth daily.     atorvastatin (LIPITOR) 80 MG tablet Take 1 tablet (80 mg total) by mouth daily. 90 tablet 2   calcium carbonate (TUMS - DOSED IN MG ELEMENTAL CALCIUM) 500 MG chewable tablet Chew 500 mg by mouth daily as needed for indigestion or heartburn.     chlorpheniramine-HYDROcodone (TUSSIONEX) 10-8 MG/5ML Take 5 mLs by mouth every 12 (twelve) hours as needed. 140 mL 0   dicyclomine (BENTYL) 20 MG tablet Take 1 tablet (20 mg total) by mouth 4 (four) times daily -  before meals and at bedtime. 120 tablet 2   diltiazem (CARDIZEM) 60 MG tablet Take 1 tablet (60 mg total) by mouth 3 (three) times daily as needed (for tachycardia). 60 tablet 2   dronedarone (MULTAQ) 400 MG tablet Take 1 tablet (400 mg total) by mouth 2 (two) times daily with a meal. 60 tablet 3   Insulin Pen Needle 32G X 6 MM MISC Use with victoza 100 each 3   liraglutide (VICTOZA) 18 MG/3ML SOPN Start 0.6mg  SQ once a day for 7 days, then increase to 1.2mg  once a day 6 mL 2   losartan (COZAAR) 50 MG tablet Take 50 mg by mouth daily.     metoprolol succinate (TOPROL-XL) 100 MG  24 hr tablet TAKE 1 TABLET(100 MG) BY MOUTH TWICE DAILY WITH OR IMMEDIATELY FOLLOWING A MEAL 180 tablet 0   omeprazole (PRILOSEC) 40 MG capsule TAKE 1 CAPSULE BY MOUTH DAILY 30 capsule 2   potassium chloride SA (KLOR-CON M20) 20 MEQ tablet TAKE 2 TABLETS BY MOUTH TWICE A DAY 360 tablet 1   spironolactone (ALDACTONE) 25 MG tablet Take 1 tablet (25 mg total) by mouth daily. 90 tablet 3   sucralfate (CARAFATE) 1 g tablet Take 1 tablet (1 g total) by mouth 4 (four) times daily -  with meals and at bedtime. 120 tablet 2   torsemide (DEMADEX) 20 MG tablet TAKE 1 TABLET BY MOUTH TWICE A DAY 60 tablet 3   Vitamin D,  Ergocalciferol, (DRISDOL) 1.25 MG (50000 UNIT) CAPS capsule TAKE 1 CAPSULE BY MOUTH ONE TIME PER WEEK 12 capsule 1   warfarin (COUMADIN) 2 MG tablet TAKE 1/2 TO 1 TABLET BY MOUTH DAILY AS DIRECTED BY THE ANTI-COAG CLINIC (Patient taking differently: Take 1-2 mg by mouth See admin instructions. Take 2 mg every day every day except on Sunday Thursday take 1 mg per patient) 90 tablet 1   No current facility-administered medications for this visit.    Allergies as of 07/29/2022 - Review Complete 07/29/2022  Allergen Reaction Noted   Morphine Hives and Rash 06/06/2012   Oxycodone hcl Hives and Itching 12/26/2014   Dilaudid [hydromorphone hcl] Itching 12/26/2014    Past Medical History:  Diagnosis Date   (HFpEF) heart failure with preserved ejection fraction (HCC)    a. 08/2017 Echo: EF 55-60%.  Grade 2 diastolic dysfunction; b. 05/2020 Echo: EF 50-55%, no rwma, Nl RV fxn, nl fxn'ing mech MV.   Allergy    Anemia    Anxiety    Asthma    BRCA negative 03/22/2013   Bronchitis 02/19/2016   ON LEVAQUIN PO   Chest tightness    a. 08/2019 Cath: nl cors.   Cigarette smoker 09/11/2017   8-10 day   Dysrhythmia    GERD (gastroesophageal reflux disease)    History of kidney stones    Hyperlipidemia    Hypertension    Interstitial lung disease (HCC)    a. CT 2013 b. 02/2018 CXR noted recurrent intersistial changes ILD vs chronic bronchitis   Moderate mitral stenosis    a.  08/2017 TEE: EF 60 to 65%.  Moderate mitral stenosis.  Mean gradient 14 mmHg.  Valve area 2.59 cm by planimetry, 2.72 cm by pressure half-time.   PAF (paroxysmal atrial fibrillation) (HCC)    a. 08/2017 s/p TEE/DCCV; b. CHA2DS2VASc = 2-->warfarin.   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 04/2020 s/p  27/29 mm Onyx mech mitral valve-->chronic coumadin; b. 05/2020 Echo: Nl fxn'ing mech MV.    Past Surgical History:  Procedure Laterality Date   ABDOMINAL HYSTERECTOMY     total   ABDOMINAL HYSTERECTOMY     BREAST BIOPSY Bilateral 2012   BREAST BIOPSY Right 08-07-12   fibroadenomatous changes and columnar cells   BREAST BIOPSY  02/03/2015   stereo byrnett   BUBBLE STUDY  04/01/2020   Procedure: BUBBLE STUDY;  Surgeon: Parke Poisson, MD;  Location: [facility] ENDOSCOPY;  Service: Cardiovascular;;   CARDIAC CATHETERIZATION     CHOLECYSTECTOMY N/A 02/27/2016  Procedure: LAPAROSCOPIC CHOLECYSTECTOMY;  Surgeon: Kieth Brightly, MD;  Location: [facility] ORS;  Service: General;  Laterality: N/A;   CLIPPING OF ATRIAL APPENDAGE  04/29/2020   Procedure: CLIPPING OF ATRIAL APPENDAGE USING ATRICURE  PRO2 CLIP SIZE ;  Surgeon: Purcell Nails, MD;  Location: [facility] OR;  Service: Open Heart Surgery;;   COLONOSCOPY WITH PROPOFOL N/A 09/26/2018   Procedure: COLONOSCOPY WITH PROPOFOL;  Surgeon: Midge Minium, MD;  Location: [facility] ENDOSCOPY;  Service: Endoscopy;  Laterality: N/A;   COLONOSCOPY WITH PROPOFOL N/A 07/16/2021   Procedure: COLONOSCOPY WITH PROPOFOL;  Surgeon: Midge Minium, MD;  Location: [facility] ENDOSCOPY;  Service: Endoscopy;  Laterality: N/A;   CYSTOSCOPY W/ RETROGRADES Right 08/18/2018   Procedure: CYSTOSCOPY WITH RETROGRADE PYELOGRAM;  Surgeon: Sondra Come, MD;  Location: [facility] ORS;  Service: Urology;  Laterality: Right;   CYSTOSCOPY/URETEROSCOPY/HOLMIUM LASER/STENT PLACEMENT Right 08/18/2018   Procedure: CYSTOSCOPY/URETEROSCOPY/STENT PLACEMENT;  Surgeon: Sondra Come, MD;  Location: [facility] ORS;  Service: Urology;  Laterality: Right;   DIAGNOSTIC LAPAROSCOPY     ESOPHAGOGASTRODUODENOSCOPY N/A 07/16/2021   Procedure: ESOPHAGOGASTRODUODENOSCOPY (EGD);  Surgeon: Midge Minium, MD;  Location: [facility] ENDOSCOPY;  Service: Endoscopy;  Laterality: N/A;   ESOPHAGOGASTRODUODENOSCOPY (EGD) WITH PROPOFOL N/A  09/26/2018   Procedure: ESOPHAGOGASTRODUODENOSCOPY (EGD) WITH PROPOFOL;  Surgeon: Midge Minium, MD;  Location: [facility] ENDOSCOPY;  Service: Endoscopy;  Laterality: N/A;   GIVENS CAPSULE STUDY N/A 11/03/2018   Procedure: GIVENS CAPSULE STUDY;  Surgeon: Midge Minium, MD;  Location: [facility] ENDOSCOPY;  Service: Endoscopy;  Laterality: N/A;   JOINT REPLACEMENT Left    knee   KNEE ARTHROPLASTY Right 04/09/2019   Procedure: COMPUTER ASSISTED TOTAL KNEE ARTHROPLASTY;  Surgeon: Donato Heinz, MD;  Location: [facility] ORS;  Service: Orthopedics;  Laterality: Right;   KNEE CLOSED REDUCTION Left 04/15/2015   Procedure: CLOSED MANIPULATION KNEE;  Surgeon: Juanell Fairly, MD;  Location: [facility] ORS;  Service: Orthopedics;  Laterality: Left;   KNEE SURGERY     MAZE N/A 04/29/2020   Procedure: MAZE;  Surgeon: Purcell Nails, MD;  Location: [facility] OR;  Service: Open Heart Surgery;  Laterality: N/A;   MITRAL VALVE REPLACEMENT N/A 04/29/2020   Procedure: MITRAL VALVE (MV) REPLACEMENT USING ON-X VALVE SIZE 27/29MM;  Surgeon: Purcell Nails, MD;  Location: [facility] OR;  Service: Open Heart Surgery;  Laterality: N/A;   MULTIPLE EXTRACTIONS WITH ALVEOLOPLASTY N/A 02/28/2020   Procedure: MULTIPLE EXTRACTION WITH ALVEOLOPLASTY;  Surgeon: Sharman Cheek, DMD;  Location: [facility] OR;  Service: Dentistry;  Laterality: N/A;   RIGHT/LEFT HEART CATH AND CORONARY ANGIOGRAPHY Bilateral 09/06/2019   Procedure: RIGHT/LEFT HEART CATH AND CORONARY ANGIOGRAPHY;  Surgeon: Antonieta Iba, MD;  Location: [facility] INVASIVE CV LAB;  Service: Cardiovascular;  Laterality: Bilateral;   TEE WITHOUT CARDIOVERSION N/A 09/13/2017   Procedure: TRANSESOPHAGEAL ECHOCARDIOGRAM (TEE);  Surgeon: Yvonne Kendall, MD;  Location: [facility] ORS;  Service: Cardiovascular;  Laterality: N/A;   TEE WITHOUT CARDIOVERSION N/A 04/01/2020   Procedure: TRANSESOPHAGEAL ECHOCARDIOGRAM (TEE);  Surgeon: Parke Poisson, MD;  Location: [facility] ENDOSCOPY;  Service: Cardiovascular;  Laterality: N/A;    TEE WITHOUT CARDIOVERSION N/A 04/29/2020   Procedure: TRANSESOPHAGEAL ECHOCARDIOGRAM (TEE);  Surgeon: Purcell Nails, MD;  Location: [facility] OR;  Service: Open Heart Surgery;  Laterality: N/A;   TOTAL KNEE ARTHROPLASTY Left 12/25/2014   Procedure: TOTAL KNEE ARTHROPLASTY;  Surgeon: Juanell Fairly, MD;  Location: [facility] ORS;  Service: Orthopedics;  Laterality: Left;   TUBAL LIGATION      Review of Systems:    All systems reviewed  and negative except where noted in HPI.   Physical Examination:   BP (!) 142/89   Pulse 72   Temp 98.5 F (36.9 C)   Ht 5\' 4"  (1.626 m)   Wt 246 lb (111.6 kg)   BMI 42.23 kg/m   General: Well-nourished, well-developed in no acute distress.  Eyes: No icterus. Conjunctivae pink. Mouth: Oropharyngeal mucosa moist and pink , no lesions erythema or exudate. Lungs: Clear to auscultation bilaterally. Non-labored. Heart: Regular rate and rhythm, no murmurs rubs or gallops.  Abdomen: Bowel sounds are normal; Abdomen is Soft and very obese.; No hepatosplenomegaly, masses or hernias; mild epigastric abdominal Tenderness; rest of abdomen is not tender.  No guarding or rebound tenderness. Extremities: No lower extremity edema. No clubbing or deformities. Neuro: Alert and oriented x 3.  Grossly intact. Skin: Warm and dry, no jaundice.   Psych: Alert and cooperative, normal mood and affect.   Imaging Studies: No results found.  Assessment and Plan:   Leslie [name] is a 57 y.o. y/o female   1.  Epigastric pain -suspect gastritis, GERD, and IBS  Start Carafate 1 g 1 tablet twice daily before meals.  2.  GERD  Continue omeprazole 40 mg once daily.  She takes it 3 or 4 days/week.  Recommend she take it every day to see if this will help.  Recommend Lifestyle Modifications to prevent Acid Reflux.  Rec. Avoid coffee, sodas, peppermint, citrus fruits, and spicey foods.  Avoid eating 2-3 hours before bedtime.   We discussed adverse side effects of PPIs to include vitamin  deficiencies, osteoporosis, renal insufficiency, dementia and increased risk of C. Difficile.  Recommend take lowest effective dose of PPI necessary to control acid reflux.  OK to add H2RB (Pepcid 20mg  daily) or antiacid if needed for breakthrough acid reflux.   3.  Abdominal bloating  Start align probiotic 1 capsule once daily.   [name], PA-C  Follow up in 6 weeks with TG.

## 2022-07-30 ENCOUNTER — Ambulatory Visit
Admission: RE | Admit: 2022-07-30 | Discharge: 2022-07-30 | Disposition: A | Discharge status: Home / Self Care | Payer: 59 | Source: Ambulatory Visit | Attending: Physician Assistant | Admitting: Physician Assistant

## 2022-07-30 DIAGNOSIS — Z1231 Encounter for screening mammogram for malignant neoplasm of breast: Secondary | ICD-10-CM | POA: Insufficient documentation

## 2022-08-05 ENCOUNTER — Telehealth: Payer: Self-pay

## 2022-08-05 ENCOUNTER — Telehealth: Payer: Self-pay | Admitting: Cardiovascular Disease

## 2022-08-05 NOTE — Telephone Encounter (Signed)
Attempted to contact pt's Husband. Unable to leave VM as mailbox is full

## 2022-08-05 NOTE — Telephone Encounter (Signed)
STAT if HR is under 50 or over 120 (normal HR is 60-100 beats per minute)  What is your heart rate? Not sure   Do you have a log of your heart rate readings (document readings)? No  Do you have any other symptoms? Pt's husband calling because the pt is having a fast heart rate and she is not able to eat.

## 2022-08-06 ENCOUNTER — Ambulatory Visit (INDEPENDENT_AMBULATORY_CARE_PROVIDER_SITE_OTHER): Payer: 59 | Admitting: Physician Assistant

## 2022-08-06 VITALS — BP 150/80 | HR 74 | Temp 98.2°F | Resp 16 | Ht 64.0 in | Wt 245.0 lb

## 2022-08-06 DIAGNOSIS — R300 Dysuria: Secondary | ICD-10-CM | POA: Diagnosis not present

## 2022-08-06 DIAGNOSIS — N390 Urinary tract infection, site not specified: Secondary | ICD-10-CM | POA: Diagnosis not present

## 2022-08-06 LAB — POCT URINALYSIS DIPSTICK
Bilirubin, UA: NEGATIVE
Blood, UA: NEGATIVE
Glucose, UA: NEGATIVE
Leukocytes, UA: NEGATIVE
Nitrite, UA: NEGATIVE
Protein, UA: NEGATIVE
Spec Grav, UA: <=1.005 — AB (ref 1.010–1.025)
Urobilinogen, UA: 0.2 E.U./dL
pH, UA: 7 (ref 5.0–8.0)

## 2022-08-06 MED ORDER — NITROFURANTOIN MONOHYD MACRO 100 MG PO CAPS: 100.0000 mg | ORAL_CAPSULE | Freq: Two times a day (BID) | ORAL | 0 refills | Status: DC

## 2022-08-06 NOTE — Progress Notes (Signed)
Pt was just here for UA check due to she is having UTI symptoms and urine is clear as per lauren due to she is having UTI symptoms we send Macrobid for 5 days and advised call her GI if she is having abdominal pain and we let her know when culture result come back

## 2022-08-09 LAB — CULTURE, URINE COMPREHENSIVE

## 2022-08-10 ENCOUNTER — Telehealth: Payer: Self-pay

## 2022-08-10 NOTE — Telephone Encounter (Signed)
-----   Message from Carlean Jews sent at 08/10/2022 10:52 AM EDT ----- Please let her know that her urine only showed mixed flora which typically does not require treatment

## 2022-08-10 NOTE — Telephone Encounter (Signed)
Spoke with patient regarding urine culture results. 

## 2022-08-12 NOTE — Telephone Encounter (Signed)
Called patient, she states she is feeling much better now- she did take her medications listed on file for palpitations and tachycardia. She was advised to call us back if she needs [place].  Patient will do this. Verbalized understanding thankful for call back

## 2022-08-16 ENCOUNTER — Ambulatory Visit (INDEPENDENT_AMBULATORY_CARE_PROVIDER_SITE_OTHER): Payer: 59

## 2022-08-16 DIAGNOSIS — E538 Deficiency of other specified B group vitamins: Secondary | ICD-10-CM | POA: Diagnosis not present

## 2022-08-16 MED ORDER — CYANOCOBALAMIN 1000 MCG/ML IJ SOLN
1000.0000 ug | Freq: Once | INTRAMUSCULAR | Status: AC
Start: 2022-08-16 — End: 2022-08-16
  Administered 2022-08-16: 1000 ug via INTRAMUSCULAR

## 2022-08-18 ENCOUNTER — Ambulatory Visit: Payer: 59 | Attending: Cardiovascular Disease

## 2022-08-18 DIAGNOSIS — I050 Rheumatic mitral stenosis: Secondary | ICD-10-CM

## 2022-08-18 DIAGNOSIS — Z5181 Encounter for therapeutic drug level monitoring: Secondary | ICD-10-CM

## 2022-08-18 DIAGNOSIS — I4891 Unspecified atrial fibrillation: Secondary | ICD-10-CM

## 2022-08-18 LAB — POCT INR: INR: 2.7 (ref 2.0–3.0)

## 2022-08-18 NOTE — Patient Instructions (Signed)
continue 1 tablet daily except 1/2 tablet on Mondays and Fridays. Stay consistent with greens.  - Recheck INR in 6 weeks

## 2022-08-19 ENCOUNTER — Ambulatory Visit: Payer: BLUE CROSS/BLUE SHIELD | Admitting: Dermatology

## 2022-08-22 ENCOUNTER — Other Ambulatory Visit: Payer: Self-pay | Admitting: Nurse Practitioner

## 2022-08-22 DIAGNOSIS — R109 Unspecified abdominal pain: Secondary | ICD-10-CM

## 2022-08-23 ENCOUNTER — Other Ambulatory Visit: Payer: Self-pay

## 2022-08-23 MED ORDER — OMEPRAZOLE 40 MG PO CPDR: 40.0000 mg | DELAYED_RELEASE_CAPSULE | Freq: Every day | ORAL | 2 refills | Status: DC

## 2022-09-06 ENCOUNTER — Telehealth: Payer: Self-pay

## 2022-09-06 NOTE — Telephone Encounter (Signed)
Completed P.A. for patient's Omeprazole.

## 2022-09-07 ENCOUNTER — Telehealth: Payer: Self-pay

## 2022-09-07 NOTE — Telephone Encounter (Signed)
Patient approved for Omeprazole, notified patient.

## 2022-09-09 NOTE — Telephone Encounter (Signed)
Pt's husband called stating pt is having palpitations/tachycardia and abd bloating. Pt's husband is unaware of how long sx have been going on. Transferring pt to her Cardiology office. Spoke to [facility] and transferred pt's husband to speak w/ nurse.

## 2022-09-13 ENCOUNTER — Ambulatory Visit (INDEPENDENT_AMBULATORY_CARE_PROVIDER_SITE_OTHER): Payer: Medicaid Other

## 2022-09-13 DIAGNOSIS — E538 Deficiency of other specified B group vitamins: Secondary | ICD-10-CM

## 2022-09-13 MED ORDER — CYANOCOBALAMIN 1000 MCG/ML IJ SOLN
1000.0000 ug | Freq: Once | INTRAMUSCULAR | Status: AC
Start: 2022-09-13 — End: 2022-09-13
  Administered 2022-09-13: 1000 ug via INTRAMUSCULAR

## 2022-09-14 ENCOUNTER — Encounter (HOSPITAL_COMMUNITY): Payer: Self-pay

## 2022-09-14 ENCOUNTER — Emergency Department (HOSPITAL_COMMUNITY)
Admission: EM | Admit: 2022-09-14 | Discharge: 2022-09-14 | Disposition: A | Discharge status: Home / Self Care | Payer: 59 | Attending: Emergency Medicine | Admitting: Emergency Medicine

## 2022-09-14 ENCOUNTER — Other Ambulatory Visit: Payer: Self-pay

## 2022-09-14 ENCOUNTER — Emergency Department (HOSPITAL_COMMUNITY): Payer: 59

## 2022-09-14 DIAGNOSIS — Z79899 Other long term (current) drug therapy: Secondary | ICD-10-CM | POA: Diagnosis not present

## 2022-09-14 DIAGNOSIS — R509 Fever, unspecified: Secondary | ICD-10-CM | POA: Insufficient documentation

## 2022-09-14 DIAGNOSIS — R5383 Other fatigue: Secondary | ICD-10-CM | POA: Diagnosis not present

## 2022-09-14 DIAGNOSIS — R0602 Shortness of breath: Secondary | ICD-10-CM | POA: Insufficient documentation

## 2022-09-14 DIAGNOSIS — N281 Cyst of kidney, acquired: Secondary | ICD-10-CM | POA: Diagnosis not present

## 2022-09-14 DIAGNOSIS — Z20822 Contact with and (suspected) exposure to covid-19: Secondary | ICD-10-CM | POA: Insufficient documentation

## 2022-09-14 DIAGNOSIS — N200 Calculus of kidney: Secondary | ICD-10-CM | POA: Diagnosis not present

## 2022-09-14 DIAGNOSIS — I129 Hypertensive chronic kidney disease with stage 1 through stage 4 chronic kidney disease, or unspecified chronic kidney disease: Secondary | ICD-10-CM | POA: Insufficient documentation

## 2022-09-14 DIAGNOSIS — R350 Frequency of micturition: Secondary | ICD-10-CM | POA: Insufficient documentation

## 2022-09-14 DIAGNOSIS — R109 Unspecified abdominal pain: Secondary | ICD-10-CM | POA: Diagnosis not present

## 2022-09-14 DIAGNOSIS — Z794 Long term (current) use of insulin: Secondary | ICD-10-CM | POA: Insufficient documentation

## 2022-09-14 DIAGNOSIS — R059 Cough, unspecified: Secondary | ICD-10-CM | POA: Diagnosis not present

## 2022-09-14 DIAGNOSIS — I340 Nonrheumatic mitral (valve) insufficiency: Secondary | ICD-10-CM | POA: Diagnosis not present

## 2022-09-14 DIAGNOSIS — N189 Chronic kidney disease, unspecified: Secondary | ICD-10-CM | POA: Insufficient documentation

## 2022-09-14 DIAGNOSIS — M545 Low back pain, unspecified: Secondary | ICD-10-CM | POA: Insufficient documentation

## 2022-09-14 DIAGNOSIS — Z7901 Long term (current) use of anticoagulants: Secondary | ICD-10-CM | POA: Diagnosis not present

## 2022-09-14 DIAGNOSIS — R140 Abdominal distension (gaseous): Secondary | ICD-10-CM

## 2022-09-14 DIAGNOSIS — Z7982 Long term (current) use of aspirin: Secondary | ICD-10-CM | POA: Diagnosis not present

## 2022-09-14 LAB — URINALYSIS, ROUTINE W REFLEX MICROSCOPIC
Bilirubin Urine: NEGATIVE
Glucose, UA: NEGATIVE mg/dL
Hgb urine dipstick: NEGATIVE
Ketones, ur: NEGATIVE mg/dL
Leukocytes,Ua: NEGATIVE
Nitrite: NEGATIVE
Protein, ur: NEGATIVE mg/dL
Specific Gravity, Urine: 1.006 (ref 1.005–1.030)
pH: 6 (ref 5.0–8.0)

## 2022-09-14 LAB — PROTIME-INR
INR: 1.9 — ABNORMAL HIGH (ref 0.8–1.2)
Prothrombin Time: 21.6 seconds — ABNORMAL HIGH (ref 11.4–15.2)

## 2022-09-14 LAB — CBC WITH DIFFERENTIAL/PLATELET
Abs Immature Granulocytes: 0.02 10*3/uL (ref 0.00–0.07)
Basophils Absolute: 0 10*3/uL (ref 0.0–0.1)
Basophils Relative: 1 %
Eosinophils Absolute: 0.1 10*3/uL (ref 0.0–0.5)
Eosinophils Relative: 2 %
HCT: 33.9 % — ABNORMAL LOW (ref 36.0–46.0)
Hemoglobin: 10.7 g/dL — ABNORMAL LOW (ref 12.0–15.0)
Immature Granulocytes: 0 %
Lymphocytes Relative: 31 %
Lymphs Abs: 1.8 10*3/uL (ref 0.7–4.0)
MCH: 29.1 pg (ref 26.0–34.0)
MCHC: 31.6 g/dL (ref 30.0–36.0)
MCV: 92.1 fL (ref 80.0–100.0)
Monocytes Absolute: 0.7 10*3/uL (ref 0.1–1.0)
Monocytes Relative: 12 %
Neutro Abs: 3.2 10*3/uL (ref 1.7–7.7)
Neutrophils Relative %: 54 %
Platelets: 299 10*3/uL (ref 150–400)
RBC: 3.68 MIL/uL — ABNORMAL LOW (ref 3.87–5.11)
RDW: 12.9 % (ref 11.5–15.5)
WBC: 5.9 10*3/uL (ref 4.0–10.5)
nRBC: 0 % (ref 0.0–0.2)

## 2022-09-14 LAB — BASIC METABOLIC PANEL
Anion gap: 10 (ref 5–15)
BUN: 29 mg/dL — ABNORMAL HIGH (ref 6–20)
CO2: 28 mmol/L (ref 22–32)
Calcium: 9.1 mg/dL (ref 8.9–10.3)
Chloride: 100 mmol/L (ref 98–111)
Creatinine, Ser: 1.93 mg/dL — ABNORMAL HIGH (ref 0.44–1.00)
GFR, Estimated: 30 mL/min — ABNORMAL LOW (ref >60)
Glucose, Bld: 99 mg/dL (ref 70–99)
Potassium: 4.1 mmol/L (ref 3.5–5.1)
Sodium: 138 mmol/L (ref 135–145)

## 2022-09-14 LAB — SARS CORONAVIRUS 2 BY RT PCR: SARS Coronavirus 2 by RT PCR: NEGATIVE

## 2022-09-14 LAB — BRAIN NATRIURETIC PEPTIDE: B Natriuretic Peptide: 113.1 pg/mL — ABNORMAL HIGH (ref 0.0–100.0)

## 2022-09-14 MED ORDER — DICYCLOMINE HCL 10 MG/ML IM SOLN
20.0000 mg | Freq: Once | INTRAMUSCULAR | Status: AC
  Administered 2022-09-14: 20 mg via INTRAMUSCULAR
  Filled 2022-09-14: qty 2

## 2022-09-14 MED ORDER — DICYCLOMINE HCL 20 MG PO TABS: 20.0000 mg | ORAL_TABLET | Freq: Two times a day (BID) | ORAL | 0 refills | Status: DC | PRN

## 2022-09-14 NOTE — ED Provider Triage Note (Signed)
Emergency Medicine Provider Triage Evaluation Note  Leslie [name] , a 57 y.o. female  was evaluated in triage.  Pt complains of shortness of breath, fatigue, cough, abdominal bloating and bilateral lower back pain since last Tuesday. She denies known sick contacts.  Review of Systems  Positive: Fatigue, intermittent fever (pt reported Tmax of 100 F)  Cough, SOB Bilateral lower back pain    Negative: Chest pain, tachycardia Vomiting, diarrhea  Physical Exam  BP (!) 152/106 (BP Location: Right Arm)   Pulse 71   Temp 98.3 F (36.8 C) (Oral)   Resp 18   Ht 5\' 4"  (1.626 m)   Wt 111 kg   SpO2 100%   BMI 42.00 kg/m  Gen:   Awake, no distress not ill-appearing Cardiac: No pitting edema bilaterally Resp:  Normal effort, clear lung sounds bilaterally MSK:   Moves extremities without difficulty   Medical Decision Making  Medically screening exam initiated at 10:52 AM.  Appropriate orders placed.  Leslie [name] was informed that the remainder of the evaluation will be completed by another provider, this initial triage assessment does not replace that evaluation, and the importance of remaining in the ED until their evaluation is complete.     Judithann Sheen, PA 09/14/22 [phone]

## 2022-09-14 NOTE — ED Provider Notes (Signed)
Accepted handoff at shift change from [facility]. Please see prior provider note for full HPI.  Briefly: Patient is a 57 y.o. female who presents to the ER for abdominal bloating, indigestion, not feeling well x 1 week. Currently on victoza injections. Hx of kidney stones.   DDX/Plan: Follow up on CT imaging. Anticipate d/c to home with PCP follow up.   Physical Exam  BP 129/61   Pulse (!) 58   Temp 97.8 F (36.6 C)   Resp 18   Ht 5\' 4"  (1.626 m)   Wt 111 kg   SpO2 100%   BMI 42.00 kg/m   Physical Exam Vitals and nursing note reviewed.  Constitutional:      Appearance: Normal appearance.  HENT:     Head: Normocephalic and atraumatic.  Eyes:     Conjunctiva/sclera: Conjunctivae normal.  Pulmonary:     Effort: Pulmonary effort is normal. No respiratory distress.  Skin:    General: Skin is warm and dry.  Neurological:     Mental Status: She is alert.  Psychiatric:        Mood and Affect: Mood normal.        Behavior: Behavior normal.    Results  CT Renal Stone Study  Result Date: 09/14/2022 CLINICAL DATA:  Flank pain and abdominal bloating for 1 week. EXAM: CT ABDOMEN AND PELVIS WITHOUT CONTRAST TECHNIQUE: Multidetector CT imaging of the abdomen and pelvis was performed following the standard protocol without IV contrast. RADIATION DOSE REDUCTION: This exam was performed according to the departmental dose-optimization program which includes automated exposure control, adjustment of the mA and/or kV according to patient size and/or use of iterative reconstruction technique. COMPARISON:  Jun 11, 2022. FINDINGS: Lower chest: No acute abnormality. Hepatobiliary: No focal liver abnormality is seen. Status post cholecystectomy. No biliary dilatation. Pancreas: Unremarkable. No pancreatic ductal dilatation or surrounding inflammatory changes. Spleen: Normal in size without focal abnormality. Adrenals/Urinary Tract: Adrenal glands appear normal. Stable cyst seen in upper pole of right  kidney force no further follow-up is required. 8 mm rounded calculus which is nonobstructive. No hydronephrosis or renal obstruction is noted. Urinary bladder is unremarkable. Stomach/Bowel: Stomach is within normal limits. Appendix appears normal. No evidence of bowel wall thickening, distention, or inflammatory changes. Vascular/Lymphatic: Aortic atherosclerosis. No enlarged abdominal or pelvic lymph nodes. Reproductive: Status post hysterectomy. No adnexal masses. Other: No abdominal wall hernia or abnormality. No abdominopelvic ascites. Musculoskeletal: No acute or significant osseous findings. IMPRESSION: Stable nonobstructive right renal calculus. No hydronephrosis or renal obstruction. Aortic Atherosclerosis (ICD10-I70.0). Electronically Signed   By: Lupita Raider M.D.   On: 09/14/2022 16:21   ED Course / MDM    Medical Decision Making Amount and/or Complexity of Data Reviewed Labs: ordered. Radiology: ordered.  Risk Prescription drug management.  Reviewed CT results with patient. She is feeling much better after bentyl injection. Will prescribe some for home and recommend following up with PCP. She is stable for discharge home. Patient agreeable to the plan and all questions answered.    Jeanella Flattery 09/14/22 2102    Tegeler, Canary Brim, MD 09/14/22 [phone]

## 2022-09-14 NOTE — ED Provider Notes (Signed)
This is a 57 year old female significant history of obesity, paroxysmal A-fib, hypertension, CKD presenting with multiple complaints.  Her primary complaint is felt bloatedness and having abdominal discomfort.  Discomfort is across her abdomen and radiates to her right flank.  She also endorsed increased urinary frequency without burning urination.  She does endorse some shortness of breath but not worse than usual.  She did mention her grandson had COVID 2 weeks ago.  She endorsed nausea without vomiting.  She feels a bit constipated but did had a bowel movement this morning.  Denies any fever or chills hematuria.  -Labs ordered, independently viewed and interpreted by me.  Labs remarkable for BUN 29, Cr. 1.93 which is similar to baseline.  BNP 113 similar to prior.  Covid negative.  UA without UTI -The patient was maintained on a cardiac monitor.  I personally viewed and interpreted the cardiac monitored which showed an underlying rhythm of: sinus brady -Imaging independently viewed and interpreted by me and I agree with radiologist's interpretation.  Result remarkable for CXR without acute changes -This patient presents to the ED for concern of multiple complaints, this involves an extensive number of treatment options, and is a complaint that carries with it a high risk of complications and morbidity.  The differential diagnosis includes covid, flu, CHF exacerbation, COPD, UTI, kidney failure -Co morbidities that complicate the patient evaluation includes CKD, CHF, obesity, DM -Treatment includes zofran, bentyl -Reevaluation of the patient after these medicines showed that the patient improved -PCP office notes or outside notes reviewed -Discussion with oncoming team who will f/u on CT result -Escalation to admission/observation considered: dispo pending  BP 129/61   Pulse (!) 58   Temp 97.8 F (36.6 C)   Resp 18   Ht 5\' 4"  (1.626 m)   Wt 111 kg   SpO2 100%   BMI 42.00 kg/m   Results for  orders placed or performed during the hospital encounter of 09/14/22  SARS Coronavirus 2 by RT PCR (hospital order, performed in [facility] hospital lab) *cepheid single result test* Anterior Nasal Swab   Specimen: Anterior Nasal Swab  Result Value Ref Range   SARS Coronavirus 2 by RT PCR NEGATIVE NEGATIVE  Basic metabolic panel  Result Value Ref Range   Sodium 138 135 - 145 mmol/L   Potassium 4.1 3.5 - 5.1 mmol/L   Chloride 100 98 - 111 mmol/L   CO2 28 22 - 32 mmol/L   Glucose, Bld 99 70 - 99 mg/dL   BUN 29 (H) 6 - 20 mg/dL   Creatinine, Ser 1.61 (H) 0.44 - 1.00 mg/dL   Calcium 9.1 8.9 - 09.6 mg/dL   GFR, Estimated 30 (L) >60 mL/min   Anion gap 10 5 - 15  Brain natriuretic peptide  Result Value Ref Range   B Natriuretic Peptide 113.1 (H) 0.0 - 100.0 pg/mL  CBC with Differential  Result Value Ref Range   WBC 5.9 4.0 - 10.5 K/uL   RBC 3.68 (L) 3.87 - 5.11 MIL/uL   Hemoglobin 10.7 (L) 12.0 - 15.0 g/dL   HCT 04.5 (L) 40.9 - 81.1 %   MCV 92.1 80.0 - 100.0 fL   MCH 29.1 26.0 - 34.0 pg   MCHC 31.6 30.0 - 36.0 g/dL   RDW 91.4 78.2 - 95.6 %   Platelets 299 150 - 400 K/uL   nRBC 0.0 0.0 - 0.2 %   Neutrophils Relative % 54 %   Neutro Abs 3.2 1.7 - 7.7 K/uL  Lymphocytes Relative 31 %   Lymphs Abs 1.8 0.7 - 4.0 K/uL   Monocytes Relative 12 %   Monocytes Absolute 0.7 0.1 - 1.0 K/uL   Eosinophils Relative 2 %   Eosinophils Absolute 0.1 0.0 - 0.5 K/uL   Basophils Relative 1 %   Basophils Absolute 0.0 0.0 - 0.1 K/uL   Immature Granulocytes 0 %   Abs Immature Granulocytes 0.02 0.00 - 0.07 K/uL  Urinalysis, Routine w reflex microscopic -Urine, Clean Catch  Result Value Ref Range   Color, Urine STRAW (A) YELLOW   APPearance CLEAR CLEAR   Specific Gravity, Urine 1.006 1.005 - 1.030   pH 6.0 5.0 - 8.0   Glucose, UA NEGATIVE NEGATIVE mg/dL   Hgb urine dipstick NEGATIVE NEGATIVE   Bilirubin Urine NEGATIVE NEGATIVE   Ketones, ur NEGATIVE NEGATIVE mg/dL   Protein, ur NEGATIVE NEGATIVE  mg/dL   Nitrite NEGATIVE NEGATIVE   Leukocytes,Ua NEGATIVE NEGATIVE   *Note: Due to a large number of results and/or encounters for the requested time period, some results have not been displayed. A complete set of results can be found in Results Review.   DG Chest 2 View  Result Date: 09/14/2022 CLINICAL DATA:  Fatigue, shortness of breath, and abdominal bloating for the past week. EXAM: CHEST - 2 VIEW COMPARISON:  Chest x-ray dated Jun 11, 2022. FINDINGS: The heart size and mediastinal contours are within normal limits. Prior MVR and left atrial appendage clipping. Normal pulmonary vascularity. No focal consolidation, pleural effusion, or pneumothorax. No acute osseous abnormality. IMPRESSION: 1. No acute cardiopulmonary disease. Electronically Signed   By: Obie Dredge M.D.   On: 09/14/2022 13:11       Fayrene Helper, PA-C 09/14/22 1514    Bethann Berkshire, MD 09/15/22 [phone]

## 2022-09-14 NOTE — ED Triage Notes (Signed)
Pt reports fatigue, SOB, abdominal bloating for 1 week. States she had a fever once. Has not been exposure to anyone sick. Patient does not appear to be in any distress in triage, able to speak in full sentences. Denies CP, dizziness, N/V or any other symptoms

## 2022-09-14 NOTE — ED Provider Notes (Signed)
[place] EMERGENCY DEPARTMENT AT [facility] Provider Note   CSN: [id number] Arrival date & time: 09/14/22  1036     History  Chief Complaint  Patient presents with   Back Pain   Fatigue   Shortness of Breath   Bloated    Leslie [name] is a 57 y.o. female with past medical history of obesity, hypertension, paroxysmal A-fib, mitral valve stenosis, depression, B12 deficiency, and CKD presents to the emergency department for evaluation of fatigue, shortness of breath, "bloated", and bilateral lower back pain since last Tuesday. She reports a fever one time with a Tmax of 100 F last week.  She reports that she has been feeling like she has had more fluid recently and took an additional dose of torsemide yesterday. Upon ambulation, she reports increased shortness of breath versus at rest.  She reports that her grandson had COVID 2 weeks ago and she had seen him during that time but was wearing a surgical mask.  She also complains of increased frequency with little UO since taking an additional dose of Torsemide.  PA Laveda Norman assessed patient who reported her most significant complaint was abdominal distension and lower back pain over the past week. She reported that her SOB was no worse than usual. Of note, she has had kidney stones in the past.   Back Pain Associated symptoms: fever   Associated symptoms: no chest pain, no dysuria and no headaches   Shortness of Breath Associated symptoms: cough and fever   Associated symptoms: no chest pain, no ear pain, no headaches and no vomiting        Home Medications Prior to Admission medications   Medication Sig Start Date End Date Taking? Authorizing Provider  ALPRAZolam Prudy Feeler) 0.5 MG tablet TAKE 1/2 TABLET BY MOUTH AT BEDTIME AS NEEDED FOR ANXIETY 03/19/22   McDonough, Jeanet Lupe K, PA-C  amLODipine (NORVASC) 5 MG tablet Take 5 mg by mouth daily.    [provider]  ampicillin (PRINCIPEN) 500 MG capsule Take 1 capsule  (500 mg total) by mouth 3 (three) times daily. 04/07/22   McDonough, Salomon Fick, PA-C  aspirin 81 MG EC tablet Take 81 mg by mouth daily.    [provider]  atorvastatin (LIPITOR) 80 MG tablet Take 1 tablet (80 mg total) by mouth daily. 03/01/22   Debbe Odea, MD  calcium carbonate (TUMS - DOSED IN MG ELEMENTAL CALCIUM) 500 MG chewable tablet Chew 500 mg by mouth daily as needed for indigestion or heartburn.    [provider]  chlorpheniramine-HYDROcodone (TUSSIONEX) 10-8 MG/5ML Take 5 mLs by mouth every 12 (twelve) hours as needed. 12/16/21   Sallyanne Kuster, NP  dicyclomine (BENTYL) 20 MG tablet TAKE 1 TABLET (20 MG TOTAL) BY MOUTH 4 (FOUR) TIMES DAILY - BEFORE MEALS AND AT BEDTIME. 08/23/22   Abernathy, Arlyss Repress, NP  diltiazem (CARDIZEM) 60 MG tablet Take 1 tablet (60 mg total) by mouth 3 (three) times daily as needed (for tachycardia). 04/26/22   Antonieta Iba, MD  dronedarone (MULTAQ) 400 MG tablet Take 1 tablet (400 mg total) by mouth 2 (two) times daily with a meal. 04/26/22   Gollan, Tollie Pizza, MD  Insulin Pen Needle 32G X 6 MM MISC Use with victoza 05/04/22   McDonough, Garnell Phenix K, PA-C  liraglutide (VICTOZA) 18 MG/3ML SOPN Start 0.6mg  SQ once a day for 7 days, then increase to 1.2mg  once a day 04/29/22   McDonough, Salomon Fick, PA-C  losartan (COZAAR) 50 MG tablet Take  50 mg by mouth daily. 04/15/22 04/15/23  [provider]  metoprolol succinate (TOPROL-XL) 100 MG 24 hr tablet TAKE 1 TABLET(100 MG) BY MOUTH TWICE DAILY WITH OR IMMEDIATELY FOLLOWING A MEAL 07/05/22   Gollan, Tollie Pizza, MD  nitrofurantoin, macrocrystal-monohydrate, (MACROBID) 100 MG capsule Take 1 capsule (100 mg total) by mouth 2 (two) times daily. 08/06/22   McDonough, Salomon Fick, PA-C  omeprazole (PRILOSEC) 40 MG capsule Take 1 capsule (40 mg total) by mouth daily. 08/23/22   McDonough, Salomon Fick, PA-C  potassium chloride SA (KLOR-CON M20) 20 MEQ tablet TAKE 2 TABLETS BY MOUTH TWICE A DAY 06/03/22   McDonough,  Oyinkansola Truax K, PA-C  spironolactone (ALDACTONE) 25 MG tablet Take 1 tablet (25 mg total) by mouth daily. 04/16/22   Debbe Odea, MD  sucralfate (CARAFATE) 1 g tablet Take 1 tablet (1 g total) by mouth 4 (four) times daily -  with meals and at bedtime. 03/01/22   Debbe Odea, MD  sucralfate (CARAFATE) 1 g tablet Take 1 tablet (1 g total) by mouth 2 (two) times daily before a meal. 07/29/22 01/25/23  [name], PA-C  torsemide (DEMADEX) 20 MG tablet TAKE 1 TABLET BY MOUTH TWICE A DAY 06/28/22   Antonieta Iba, MD  Vitamin D, Ergocalciferol, (DRISDOL) 1.25 MG (50000 UNIT) CAPS capsule TAKE 1 CAPSULE BY MOUTH ONE TIME PER WEEK 07/19/22   McDonough, Benjimen Kelley K, PA-C  warfarin (COUMADIN) 2 MG tablet TAKE 1/2 TO 1 TABLET BY MOUTH DAILY AS DIRECTED BY THE [facility] Patient taking differently: Take 1-2 mg by mouth See admin instructions. Take 2 mg every day every day except on Sunday Thursday take 1 mg per patient 11/02/21   End, Cristal Deer, MD      Allergies    Morphine, Oxycodone hcl, and Dilaudid [hydromorphone hcl]    Review of Systems   Review of Systems  Constitutional:  Positive for fatigue and fever.       Tmax 100 F  HENT:  Negative for congestion, ear pain, trouble swallowing and voice change.   Respiratory:  Positive for cough and shortness of breath. Negative for chest tightness.   Cardiovascular:  Positive for leg swelling. Negative for chest pain.       Equal bilateral leg swelling  Gastrointestinal:  Positive for abdominal distention and nausea. Negative for diarrhea and vomiting.  Genitourinary:  Positive for frequency and urgency. Negative for dysuria and hematuria.  Musculoskeletal:  Positive for back pain.  Skin:  Negative for pallor.  Neurological:  Negative for dizziness, light-headedness and headaches.    Physical Exam Updated Vital Signs BP 129/61   Pulse (!) 58   Temp 97.8 F (36.6 C)   Resp 18   Ht 5\' 4"  (1.626 m)   Wt 111 kg   SpO2 100%   BMI  42.00 kg/m  Physical Exam Constitutional:      General: She is not in acute distress.    Appearance: She is well-developed. She is obese.  Cardiovascular:     Rate and Rhythm: Normal rate.  Pulmonary:     Effort: Pulmonary effort is normal. No tachypnea, accessory muscle usage or respiratory distress.     Breath sounds: Normal breath sounds. No decreased breath sounds or wheezing.  Abdominal:     Palpations: Abdomen is soft.     Tenderness: There is abdominal tenderness. There is no guarding or rebound.     Comments: Diffuse abdominal tenderness with no radiation Mild distension  Musculoskeletal:  Right lower leg: Edema present.     Left lower leg: Edema present.     Comments: Equal 1+ pitting edema bilaterally  Skin:    General: Skin is warm and dry.     Capillary Refill: Capillary refill takes less than 2 seconds.     Coloration: Skin is not pale.  Neurological:     Mental Status: She is alert.     ED Results / Procedures / Treatments   Labs (all labs ordered are listed, but only abnormal results are displayed) Labs Reviewed  BASIC METABOLIC PANEL - Abnormal; Notable for the following components:      Result Value   BUN 29 (*)    Creatinine, Ser 1.93 (*)    GFR, Estimated 30 (*)    All other components within normal limits  BRAIN NATRIURETIC PEPTIDE - Abnormal; Notable for the following components:   B Natriuretic Peptide 113.1 (*)    All other components within normal limits  CBC WITH DIFFERENTIAL/PLATELET - Abnormal; Notable for the following components:   RBC 3.68 (*)    Hemoglobin 10.7 (*)    HCT 33.9 (*)    All other components within normal limits  URINALYSIS, ROUTINE W REFLEX MICROSCOPIC - Abnormal; Notable for the following components:   Color, Urine STRAW (*)    All other components within normal limits  SARS CORONAVIRUS 2 BY RT PCR  PROTIME-INR    EKG None  Radiology DG Chest 2 View  Result Date: 09/14/2022 CLINICAL DATA:  Fatigue, shortness  of breath, and abdominal bloating for the past week. EXAM: CHEST - 2 VIEW COMPARISON:  Chest x-ray dated Jun 11, 2022. FINDINGS: The heart size and mediastinal contours are within normal limits. Prior MVR and left atrial appendage clipping. Normal pulmonary vascularity. No focal consolidation, pleural effusion, or pneumothorax. No acute osseous abnormality. IMPRESSION: 1. No acute cardiopulmonary disease. Electronically Signed   By: Obie Dredge M.D.   On: 09/14/2022 13:11    Procedures Procedures    Medications Ordered in ED Medications  dicyclomine (BENTYL) injection 20 mg (20 mg Intramuscular Given 09/14/22 1530)    ED Course/ Medical Decision Making/ A&P                                 Medical Decision Making Amount and/or Complexity of Data Reviewed Labs: ordered. Radiology: ordered.   Lakish Mockbee presents to the ED for evaluation.  She continues to be well appearing upon reexamination. See HPI additional information.  ED workup does not suggest acute pathology to include PNA, acute pulmonary edema, COVID, and anemia for etiology of SOB. No acute infection noted on UA. Bentyl 20mg  administered IV for abdominal pain improving pain.  CT renal ordered for possible kidney stone as etiology of symptoms but symptoms could be secondary to recent addition of Liraglutide to medication regime.  Patient was signed out to [place].           Final Clinical Impression(s) / ED Diagnoses Final diagnoses:  None    Rx / DC Orders ED Discharge Orders     None         Judithann Sheen, PA 09/14/22 1541    Bethann Berkshire, MD 09/15/22 [phone]

## 2022-09-14 NOTE — Discharge Instructions (Signed)
You were seen in the ER today for back pain, abdominal bloating, etc.  As we discussed, your blood work and CT scan looked reassuring today. Your symptoms could have been related to your recent injection. I have sent a prescription of the same medicine you received through your IV you can take this 2-4 times daily as needed for abdominal spasms. It seems another provider had prescribed this to you before.  I recommend following up with your primary doctor regarding your symptoms today. Continue to monitor how you're doing and return to the ER for new or worsening symptoms.

## 2022-09-15 ENCOUNTER — Other Ambulatory Visit: Payer: Self-pay | Admitting: Physician Assistant

## 2022-09-15 DIAGNOSIS — F419 Anxiety disorder, unspecified: Secondary | ICD-10-CM

## 2022-09-16 ENCOUNTER — Ambulatory Visit (INDEPENDENT_AMBULATORY_CARE_PROVIDER_SITE_OTHER): Payer: Medicaid Other | Admitting: Physician Assistant

## 2022-09-16 ENCOUNTER — Other Ambulatory Visit: Payer: Self-pay | Admitting: Physician Assistant

## 2022-09-16 ENCOUNTER — Encounter: Payer: Self-pay | Admitting: Physician Assistant

## 2022-09-16 VITALS — BP 132/80 | HR 90 | Temp 98.3°F | Resp 16 | Ht 64.0 in | Wt 246.0 lb

## 2022-09-16 DIAGNOSIS — F419 Anxiety disorder, unspecified: Secondary | ICD-10-CM

## 2022-09-16 DIAGNOSIS — G4709 Other insomnia: Secondary | ICD-10-CM | POA: Diagnosis not present

## 2022-09-16 DIAGNOSIS — R140 Abdominal distension (gaseous): Secondary | ICD-10-CM

## 2022-09-16 DIAGNOSIS — R109 Unspecified abdominal pain: Secondary | ICD-10-CM

## 2022-09-16 MED ORDER — TRAZODONE HCL 50 MG PO TABS
50.0000 mg | ORAL_TABLET | Freq: Every day | ORAL | 2 refills | Status: DC
Start: 2022-09-16 — End: 2022-10-08

## 2022-09-16 MED ORDER — ALPRAZOLAM 0.5 MG PO TABS
ORAL_TABLET | ORAL | 2 refills | Status: DC
Start: 2022-09-16 — End: 2023-03-22

## 2022-09-16 NOTE — Progress Notes (Signed)
[facility] [address] [place], [place] [address]  Internal MEDICINE  Office Visit Note  Patient Name: Leslie [name]  [id number]  [id number]  Date of Service: 09/28/2022  Chief Complaint  Patient presents with   Follow-up    ED Follow-Up    HPI Pt is here for routine follow up -States GI gave sucralfate but stopped it, didn't help much -Went to [employer] and back started hurting really badly along with stomach, took tylenol and then a few days ago went to ED. CT stone study continues to show same kidney stone without obstruction or any change, otherwise normal. Hemoglobin a little lower. -Taking bentyl now and feels slightly better -States she has a hx of abdominal bacterial infections previously, possible SIBO? Vs H. Pylori? Will discuss with GI next week -Found out her dad has cancer, some anxiety and difficulty sleeping.  -discussed sleep study, wants to wait until insurance change soon  Current Medication: Outpatient Encounter Medications as of 09/16/2022  Medication Sig   amLODipine (NORVASC) 5 MG tablet Take 5 mg by mouth daily.   aspirin 81 MG EC tablet Take 81 mg by mouth daily.   atorvastatin (LIPITOR) 80 MG tablet Take 1 tablet (80 mg total) by mouth daily.   calcium carbonate (TUMS - DOSED IN MG ELEMENTAL CALCIUM) 500 MG chewable tablet Chew 500 mg by mouth daily as needed for indigestion or heartburn.   chlorpheniramine-HYDROcodone (TUSSIONEX) 10-8 MG/5ML Take 5 mLs by mouth every 12 (twelve) hours as needed.   Insulin Pen Needle 32G X 6 MM MISC Use with victoza   liraglutide (VICTOZA) 18 MG/3ML SOPN Start 0.6mg  SQ once a day for 7 days, then increase to 1.2mg  once a day   losartan (COZAAR) 50 MG tablet Take 50 mg by mouth daily.   metoprolol succinate (TOPROL-XL) 100 MG 24 hr tablet TAKE 1 TABLET(100 MG) BY MOUTH TWICE DAILY WITH OR IMMEDIATELY FOLLOWING A MEAL   nitrofurantoin, macrocrystal-monohydrate, (MACROBID) 100 MG capsule Take 1 capsule (100 mg  total) by mouth 2 (two) times daily.   potassium chloride SA (KLOR-CON M20) 20 MEQ tablet TAKE 2 TABLETS BY MOUTH TWICE A DAY   spironolactone (ALDACTONE) 25 MG tablet Take 1 tablet (25 mg total) by mouth daily.   torsemide (DEMADEX) 20 MG tablet TAKE 1 TABLET BY MOUTH TWICE A DAY   traZODone (DESYREL) 50 MG tablet Take 1 tablet (50 mg total) by mouth at bedtime.   Vitamin D, Ergocalciferol, (DRISDOL) 1.25 MG (50000 UNIT) CAPS capsule TAKE 1 CAPSULE BY MOUTH ONE TIME PER WEEK   warfarin (COUMADIN) 2 MG tablet TAKE 1/2 TO 1 TABLET BY MOUTH DAILY AS DIRECTED BY THE ANTI-COAG CLINIC (Patient taking differently: Take 1-2 mg by mouth See admin instructions. Take 2 mg every day every day except on Sunday Thursday take 1 mg per patient)   [DISCONTINUED] ALPRAZolam (XANAX) 0.5 MG tablet TAKE 1/2 TABLET BY MOUTH AT BEDTIME AS NEEDED FOR ANXIETY   [DISCONTINUED] ampicillin (PRINCIPEN) 500 MG capsule Take 1 capsule (500 mg total) by mouth 3 (three) times daily.   [DISCONTINUED] dicyclomine (BENTYL) 20 MG tablet Take 1 tablet (20 mg total) by mouth 2 (two) times daily as needed for spasms.   [DISCONTINUED] diltiazem (CARDIZEM) 60 MG tablet Take 1 tablet (60 mg total) by mouth 3 (three) times daily as needed (for tachycardia).   [DISCONTINUED] dronedarone (MULTAQ) 400 MG tablet Take 1 tablet (400 mg total) by mouth 2 (two) times daily with a meal.   [  DISCONTINUED] omeprazole (PRILOSEC) 40 MG capsule Take 1 capsule (40 mg total) by mouth daily.   [DISCONTINUED] sucralfate (CARAFATE) 1 g tablet Take 1 tablet (1 g total) by mouth 4 (four) times daily -  with meals and at bedtime.   [DISCONTINUED] sucralfate (CARAFATE) 1 g tablet Take 1 tablet (1 g total) by mouth 2 (two) times daily before a meal.   ALPRAZolam (XANAX) 0.5 MG tablet TAKE 1/2 TABLET BY MOUTH AT BEDTIME AS NEEDED FOR ANXIETY   No facility-administered encounter medications on file as of 09/16/2022.    Surgical History: Past Surgical History:   Procedure Laterality Date   ABDOMINAL HYSTERECTOMY     total   ABDOMINAL HYSTERECTOMY     BREAST BIOPSY Bilateral 2012   BREAST BIOPSY Right 08-07-12   fibroadenomatous changes and columnar cells   BREAST BIOPSY  02/03/2015   stereo byrnett   BUBBLE STUDY  04/01/2020   Procedure: BUBBLE STUDY;  Surgeon: Parke Poisson, MD;  Location: [facility] ENDOSCOPY;  Service: Cardiovascular;;   CARDIAC CATHETERIZATION     CHOLECYSTECTOMY N/A 02/27/2016   Procedure: LAPAROSCOPIC CHOLECYSTECTOMY;  Surgeon: Kieth Brightly, MD;  Location: [facility] ORS;  Service: General;  Laterality: N/A;   CLIPPING OF ATRIAL APPENDAGE  04/29/2020   Procedure: CLIPPING OF ATRIAL APPENDAGE USING ATRICURE  PRO2 CLIP SIZE ;  Surgeon: Purcell Nails, MD;  Location: [facility] OR;  Service: Open Heart Surgery;;   COLONOSCOPY WITH PROPOFOL N/A 09/26/2018   Procedure: COLONOSCOPY WITH PROPOFOL;  Surgeon: Midge Minium, MD;  Location: [facility] ENDOSCOPY;  Service: Endoscopy;  Laterality: N/A;   COLONOSCOPY WITH PROPOFOL N/A 07/16/2021   Procedure: COLONOSCOPY WITH PROPOFOL;  Surgeon: Midge Minium, MD;  Location: [facility] ENDOSCOPY;  Service: Endoscopy;  Laterality: N/A;   CYSTOSCOPY W/ RETROGRADES Right 08/18/2018   Procedure: CYSTOSCOPY WITH RETROGRADE PYELOGRAM;  Surgeon: Sondra Come, MD;  Location: [facility] ORS;  Service: Urology;  Laterality: Right;   CYSTOSCOPY/URETEROSCOPY/HOLMIUM LASER/STENT PLACEMENT Right 08/18/2018   Procedure: CYSTOSCOPY/URETEROSCOPY/STENT PLACEMENT;  Surgeon: Sondra Come, MD;  Location: [facility] ORS;  Service: Urology;  Laterality: Right;   DIAGNOSTIC LAPAROSCOPY     ESOPHAGOGASTRODUODENOSCOPY N/A 07/16/2021   Procedure: ESOPHAGOGASTRODUODENOSCOPY (EGD);  Surgeon: Midge Minium, MD;  Location: [facility] ENDOSCOPY;  Service: Endoscopy;  Laterality: N/A;   ESOPHAGOGASTRODUODENOSCOPY (EGD) WITH PROPOFOL N/A 09/26/2018   Procedure: ESOPHAGOGASTRODUODENOSCOPY (EGD) WITH PROPOFOL;  Surgeon: Midge Minium, MD;  Location: [facility]  ENDOSCOPY;  Service: Endoscopy;  Laterality: N/A;   GIVENS CAPSULE STUDY N/A 11/03/2018   Procedure: GIVENS CAPSULE STUDY;  Surgeon: Midge Minium, MD;  Location: [facility] ENDOSCOPY;  Service: Endoscopy;  Laterality: N/A;   JOINT REPLACEMENT Left    knee   KNEE ARTHROPLASTY Right 04/09/2019   Procedure: COMPUTER ASSISTED TOTAL KNEE ARTHROPLASTY;  Surgeon: Donato Heinz, MD;  Location: [facility] ORS;  Service: Orthopedics;  Laterality: Right;   KNEE CLOSED REDUCTION Left 04/15/2015   Procedure: CLOSED MANIPULATION KNEE;  Surgeon: Juanell Fairly, MD;  Location: [facility] ORS;  Service: Orthopedics;  Laterality: Left;   KNEE SURGERY     MAZE N/A 04/29/2020   Procedure: MAZE;  Surgeon: Purcell Nails, MD;  Location: [facility] OR;  Service: Open Heart Surgery;  Laterality: N/A;   MITRAL VALVE REPLACEMENT N/A 04/29/2020   Procedure: MITRAL VALVE (MV) REPLACEMENT USING ON-X VALVE SIZE 27/29MM;  Surgeon: Purcell Nails, MD;  Location: [facility] OR;  Service: Open Heart Surgery;  Laterality: N/A;   MULTIPLE EXTRACTIONS WITH ALVEOLOPLASTY N/A 02/28/2020   Procedure: MULTIPLE EXTRACTION WITH ALVEOLOPLASTY;  Surgeon: Sharman Cheek, DMD;  Location: [facility] OR;  Service: Dentistry;  Laterality: N/A;   RIGHT/LEFT HEART CATH AND CORONARY ANGIOGRAPHY Bilateral 09/06/2019   Procedure: RIGHT/LEFT HEART CATH AND CORONARY ANGIOGRAPHY;  Surgeon: Antonieta Iba, MD;  Location: [facility] INVASIVE CV LAB;  Service: Cardiovascular;  Laterality: Bilateral;   TEE WITHOUT CARDIOVERSION N/A 09/13/2017   Procedure: TRANSESOPHAGEAL ECHOCARDIOGRAM (TEE);  Surgeon: Yvonne Kendall, MD;  Location: [facility] ORS;  Service: Cardiovascular;  Laterality: N/A;   TEE WITHOUT CARDIOVERSION N/A 04/01/2020   Procedure: TRANSESOPHAGEAL ECHOCARDIOGRAM (TEE);  Surgeon: Parke Poisson, MD;  Location: [facility] ENDOSCOPY;  Service: Cardiovascular;  Laterality: N/A;   TEE WITHOUT CARDIOVERSION N/A 04/29/2020   Procedure: TRANSESOPHAGEAL ECHOCARDIOGRAM (TEE);  Surgeon: Purcell Nails, MD;  Location: [facility] OR;  Service: Open Heart Surgery;  Laterality: N/A;   TOTAL KNEE ARTHROPLASTY Left 12/25/2014   Procedure: TOTAL KNEE ARTHROPLASTY;  Surgeon: Juanell Fairly, MD;  Location: [facility] ORS;  Service: Orthopedics;  Laterality: Left;   TUBAL LIGATION      Medical History: Past Medical History:  Diagnosis Date   (HFpEF) heart failure with preserved ejection fraction (HCC)    a. 08/2017 Echo: EF 55-60%.  Grade 2 diastolic dysfunction; b. 05/2020 Echo: EF 50-55%, no rwma, Nl RV fxn, nl fxn'ing mech MV.   Allergy    Anemia    Anxiety    Asthma    BRCA negative 03/22/2013   Bronchitis 02/19/2016   ON LEVAQUIN PO   Chest tightness    a. 08/2019 Cath: nl cors.   Cigarette smoker 09/11/2017   8-10 day   Dysrhythmia    GERD (gastroesophageal reflux disease)    History of kidney stones    Hyperlipidemia    Hypertension    Interstitial lung disease (HCC)    a. CT 2013 b. 02/2018 CXR noted recurrent intersistial changes ILD vs chronic bronchitis   Moderate mitral stenosis    a.  08/2017 TEE: EF 60 to 65%.  Moderate mitral stenosis.  Mean gradient 14 mmHg.  Valve area 2.59 cm by planimetry, 2.72 cm by pressure half-time.   PAF (paroxysmal atrial fibrillation) (HCC)    a. 08/2017 s/p TEE/DCCV; b. CHA2DS2VASc = 2-->warfarin.   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 04/2020 s/p 27/29 mm Onyx mech mitral valve-->chronic coumadin; b. 05/2020 Echo: Nl fxn'ing mech MV.    Family History: Family History  Problem Relation Age of Onset   Breast cancer Mother    Cancer Mother 99       breast   Hypertension Mother    Osteoarthritis Father    Hypertension Father    Cancer Maternal Aunt        breast   Breast cancer Maternal Grandmother    Cancer Maternal  Grandmother        breast    Social History   Socioeconomic History   Marital status: Married    Spouse name: Not on file   Number of children: Not on file   Years of education: Not on file   Highest education level: Not on file  Occupational History   Not on file  Tobacco Use   Smoking status: Former    Current packs/day: 0.00    Average packs/day: 0.5 packs/day for 20.0 years (10.0 ttl  pk-yrs)    Types: Cigarettes    Start date: 01/1996    Quit date: 01/2016    Years since quitting: 6.6   Smokeless tobacco: Never  Vaping Use   Vaping status: Never Used  Substance and Sexual Activity   Alcohol use: Not Currently    Comment: rarely   Drug use: No   Sexual activity: Not on file  Other Topics Concern   Not on file  Social History Narrative   Not on file   Social Determinants of Health   Financial Resource Strain: Low Risk  (05/07/2020)   Overall Financial Resource Strain (CARDIA)    Difficulty of Paying Living Expenses: Not very hard  Food Insecurity: Unknown (11/16/2021)   Hunger Vital Sign    Worried About Running Out of Food in the Last Year: Not on file    Ran Out of Food in the Last Year: Never true  Transportation Needs: No Transportation Needs (11/16/2021)   PRAPARE - Administrator, Civil Service (Medical): No    Lack of Transportation (Non-Medical): No  Physical Activity: Not on file  Stress: Not on file  Social Connections: Not on file  Intimate Partner Violence: Not At Risk (11/16/2021)   Humiliation, Afraid, Rape, and Kick questionnaire    Fear of Current or Ex-Partner: No    Emotionally Abused: No    Physically Abused: No    Sexually Abused: No      Review of Systems  Constitutional:  Negative for chills, fatigue and unexpected weight change.  HENT:  Negative for congestion, rhinorrhea, sneezing and sore throat.   Eyes:  Negative for redness.  Respiratory:  Negative for cough, chest tightness and shortness of breath.    Cardiovascular:  Negative for chest pain and palpitations.  Gastrointestinal:  Positive for abdominal pain. Negative for constipation, diarrhea, nausea and vomiting.  Genitourinary:  Negative for dysuria and frequency.  Musculoskeletal:  Positive for back pain. Negative for arthralgias, joint swelling and neck pain.  Skin:  Negative for rash.  Neurological: Negative.  Negative for tremors and numbness.  Hematological:  Negative for adenopathy. Does not bruise/bleed easily.  Psychiatric/Behavioral:  Positive for sleep disturbance. Negative for behavioral problems (Depression) and suicidal ideas. The patient is nervous/anxious.     Vital Signs: BP 132/80   Pulse 90   Temp 98.3 F (36.8 C)   Resp 16   Ht 5\' 4"  (1.626 m)   Wt 246 lb (111.6 kg)   SpO2 98%   BMI 42.23 kg/m    Physical Exam Vitals reviewed.  Constitutional:      General: She is not in acute distress.    Appearance: Normal appearance. She is obese. She is not ill-appearing.  HENT:     Head: Normocephalic and atraumatic.  Eyes:     Pupils: Pupils are equal, round, and reactive to light.  Cardiovascular:     Rate and Rhythm: Normal rate and regular rhythm.  Pulmonary:     Effort: Pulmonary effort is normal. No respiratory distress.  Abdominal:     Palpations: Abdomen is soft.  Musculoskeletal:        General: Normal range of motion.  Skin:    General: Skin is warm and dry.  Neurological:     Mental Status: She is alert and oriented to person, place, and time.  Psychiatric:        Mood and Affect: Mood normal.        Behavior: Behavior normal.  Assessment/Plan: 1. Anxiety May continue xanax as needed, will trial trazodone instead - ALPRAZolam (XANAX) 0.5 MG tablet; TAKE 1/2 TABLET BY MOUTH AT BEDTIME AS NEEDED FOR ANXIETY  Dispense: 30 tablet; Refill: 2  2. Other insomnia Will trial trazodone instead of xanax to help sleep - traZODone (DESYREL) 50 MG tablet; Take 1 tablet (50 mg total) by  mouth at bedtime.  Dispense: 30 tablet; Refill: 2  3. Abdominal bloating with cramps Will follow up with GI as planned   General Counseling: Leslie [name] verbalizes understanding of the findings of todays visit and agrees with plan of treatment. I have discussed any further diagnostic evaluation that may be needed or ordered today. We also reviewed her medications today. she has been encouraged to call the office with any questions or concerns that should arise related to todays visit.    No orders of the defined types were placed in this encounter.   Meds ordered this encounter  Medications   traZODone (DESYREL) 50 MG tablet    Sig: Take 1 tablet (50 mg total) by mouth at bedtime.    Dispense:  30 tablet    Refill:  2   ALPRAZolam (XANAX) 0.5 MG tablet    Sig: TAKE 1/2 TABLET BY MOUTH AT BEDTIME AS NEEDED FOR ANXIETY    Dispense:  30 tablet    Refill:  2    This patient was seen by Lynn Ito, PA-C in collaboration with Dr. Beverely Risen as a part of collaborative care agreement.   Total time spent:30 Minutes Time spent includes review of chart, medications, test results, and follow up plan with the patient.      Dr Lyndon Code Internal medicine

## 2022-09-22 ENCOUNTER — Other Ambulatory Visit: Payer: Self-pay

## 2022-09-23 ENCOUNTER — Encounter: Payer: Self-pay | Admitting: Physician Assistant

## 2022-09-23 ENCOUNTER — Ambulatory Visit (INDEPENDENT_AMBULATORY_CARE_PROVIDER_SITE_OTHER): Payer: 59 | Admitting: Physician Assistant

## 2022-09-23 VITALS — BP 115/67 | HR 73 | Temp 97.5°F | Ht 64.0 in | Wt 244.8 lb

## 2022-09-23 DIAGNOSIS — R110 Nausea: Secondary | ICD-10-CM | POA: Diagnosis not present

## 2022-09-23 DIAGNOSIS — K2950 Unspecified chronic gastritis without bleeding: Secondary | ICD-10-CM

## 2022-09-23 DIAGNOSIS — K5909 Other constipation: Secondary | ICD-10-CM | POA: Diagnosis not present

## 2022-09-23 DIAGNOSIS — K5904 Chronic idiopathic constipation: Secondary | ICD-10-CM

## 2022-09-23 DIAGNOSIS — R1013 Epigastric pain: Secondary | ICD-10-CM

## 2022-09-23 DIAGNOSIS — K219 Gastro-esophageal reflux disease without esophagitis: Secondary | ICD-10-CM

## 2022-09-23 DIAGNOSIS — R140 Abdominal distension (gaseous): Secondary | ICD-10-CM

## 2022-09-23 MED ORDER — LINACLOTIDE 145 MCG PO CAPS
145.0000 ug | ORAL_CAPSULE | Freq: Every day | ORAL | Status: DC
Start: 2022-09-23 — End: 2023-05-10

## 2022-09-23 MED ORDER — PANTOPRAZOLE SODIUM 40 MG PO TBEC
40.0000 mg | DELAYED_RELEASE_TABLET | Freq: Every day | ORAL | 2 refills | Status: DC
Start: 2022-09-23 — End: 2022-10-05

## 2022-09-23 NOTE — Progress Notes (Signed)
[name], PA-C [address]  [address]  [place], [place] [address]  Main: [phone]  Fax: [phone]   Primary Care Physician: Carlean Jews, PA-C  Primary Gastroenterologist:  [name], PA-C / Dr. Midge Minium    CC:  F/U abdominal pain, GERD, gastritis, IBS, bloating  HPI: Leslie [name] is a 57 y.o. female returns for 6-week follow-up of upper abdominal pain, abdominal bloating and cramping.  She takes OTC stool softener for chronic constipation and omeprazole 40 Mg daily for GERD.  6 weeks ago she started align probiotic once daily and Carafate 1 g tablet twice daily.  Carafate did not help her upper abdominal pain.  She continues to have generalized upper abdominal cramping, bloating, gas, belching, upper abdominal pain.  She tried dicyclomine which did not help and she discontinued.  She has had nausea with decreased appetite.  She started Victoza 1 month ago and her upper GI symptoms worsened after that.  Denies vomiting.  She continues to have constipation.  Recently tried OTC laxative with mild benefit.  CT abdomen pelvis without contrast 06/11/2022 showed no acute abnormality. Unchanged right kidney stone and simple kidney cyst. Small hiatal hernia.   EGD 07/16/2021 by Dr. Servando Snare showed medium hiatal hernia, 1 benign appearing intrinsic mild stenosis at the GE junction dilated to 18 mm.  Normal stomach and duodenum.   Colonoscopy 07/16/2021 showed small nonbleeding internal hemorrhoids, otherwise normal.  Good prep.  No polyps.  10-year repeat.  She has history of chronic anemia.  Last lab 09/14/2022 showed stable hemoglobin 10.7, hematocrit 33, MCV 92.  Elevated BUN 29, creatinine 1.93, GFR 30.  History of B12 deficiency and is on B12 injection through her PCP.  Normal iron studies.   Current Outpatient Medications  Medication Sig Dispense Refill   ALPRAZolam (XANAX) 0.5 MG tablet TAKE 1/2 TABLET BY MOUTH AT BEDTIME AS NEEDED FOR ANXIETY 30 tablet 2    amLODipine (NORVASC) 5 MG tablet Take 5 mg by mouth daily.     aspirin 81 MG EC tablet Take 81 mg by mouth daily.     atorvastatin (LIPITOR) 80 MG tablet Take 1 tablet (80 mg total) by mouth daily. 90 tablet 2   calcium carbonate (TUMS - DOSED IN MG ELEMENTAL CALCIUM) 500 MG chewable tablet Chew 500 mg by mouth daily as needed for indigestion or heartburn.     chlorpheniramine-HYDROcodone (TUSSIONEX) 10-8 MG/5ML Take 5 mLs by mouth every 12 (twelve) hours as needed. 140 mL 0   Insulin Pen Needle 32G X 6 MM MISC Use with victoza 100 each 3   linaclotide (LINZESS) 145 MCG CAPS capsule Take 1 capsule (145 mcg total) by mouth daily before breakfast.     liraglutide (VICTOZA) 18 MG/3ML SOPN Start 0.6mg  SQ once a day for 7 days, then increase to 1.2mg  once a day 6 mL 2   losartan (COZAAR) 50 MG tablet Take 50 mg by mouth daily.     metoprolol succinate (TOPROL-XL) 100 MG 24 hr tablet TAKE 1 TABLET(100 MG) BY MOUTH TWICE DAILY WITH OR IMMEDIATELY FOLLOWING A MEAL 180 tablet 0   nitrofurantoin, macrocrystal-monohydrate, (MACROBID) 100 MG capsule Take 1 capsule (100 mg total) by mouth 2 (two) times daily. 10 capsule 0   ondansetron (ZOFRAN) 8 MG tablet Take 8 mg by mouth every 8 (eight) hours as needed.     pantoprazole (PROTONIX) 40 MG tablet Take 1 tablet (40 mg total) by mouth daily. 30 tablet 2   potassium  chloride SA (KLOR-CON M20) 20 MEQ tablet TAKE 2 TABLETS BY MOUTH TWICE A DAY 360 tablet 1   spironolactone (ALDACTONE) 25 MG tablet Take 1 tablet (25 mg total) by mouth daily. 90 tablet 3   torsemide (DEMADEX) 20 MG tablet TAKE 1 TABLET BY MOUTH TWICE A DAY 60 tablet 3   traZODone (DESYREL) 50 MG tablet Take 1 tablet (50 mg total) by mouth at bedtime. 30 tablet 2   Vitamin D, Ergocalciferol, (DRISDOL) 1.25 MG (50000 UNIT) CAPS capsule TAKE 1 CAPSULE BY MOUTH ONE TIME PER WEEK 12 capsule 1   warfarin (COUMADIN) 2 MG tablet TAKE 1/2 TO 1 TABLET BY MOUTH DAILY AS DIRECTED BY THE ANTI-COAG CLINIC (Patient  taking differently: Take 1-2 mg by mouth See admin instructions. Take 2 mg every day every day except on Sunday Thursday take 1 mg per patient) 90 tablet 1   No current facility-administered medications for this visit.    Allergies as of 09/23/2022 - Review Complete 09/23/2022  Allergen Reaction Noted   Morphine Hives and Rash 06/06/2012   Oxycodone hcl Hives and Itching 12/26/2014   Dilaudid [hydromorphone hcl] Itching 12/26/2014    Past Medical History:  Diagnosis Date   (HFpEF) heart failure with preserved ejection fraction (HCC)    a. 08/2017 Echo: EF 55-60%.  Grade 2 diastolic dysfunction; b. 05/2020 Echo: EF 50-55%, no rwma, Nl RV fxn, nl fxn'ing mech MV.   Allergy    Anemia    Anxiety    Asthma    BRCA negative 03/22/2013   Bronchitis 02/19/2016   ON LEVAQUIN PO   Chest tightness    a. 08/2019 Cath: nl cors.   Cigarette smoker 09/11/2017   8-10 day   Dysrhythmia    GERD (gastroesophageal reflux disease)    History of kidney stones    Hyperlipidemia    Hypertension    Interstitial lung disease (HCC)    a. CT 2013 b. 02/2018 CXR noted recurrent intersistial changes ILD vs chronic bronchitis   Moderate mitral stenosis    a.  08/2017 TEE: EF 60 to 65%.  Moderate mitral stenosis.  Mean gradient 14 mmHg.  Valve area 2.59 cm by planimetry, 2.72 cm by pressure half-time.   PAF (paroxysmal atrial fibrillation) (HCC)    a. 08/2017 s/p TEE/DCCV; b. CHA2DS2VASc = 2-->warfarin.   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 04/2020 s/p 27/29 mm Onyx mech mitral valve-->chronic coumadin; b. 05/2020 Echo: Nl fxn'ing mech MV.    Past Surgical History:  Procedure Laterality Date   ABDOMINAL HYSTERECTOMY     total   ABDOMINAL HYSTERECTOMY     BREAST BIOPSY  Bilateral 2012   BREAST BIOPSY Right 08-07-12   fibroadenomatous changes and columnar cells   BREAST BIOPSY  02/03/2015   stereo byrnett   BUBBLE STUDY  04/01/2020   Procedure: BUBBLE STUDY;  Surgeon: Parke Poisson, MD;  Location: [facility]COPY;  Service: Cardiovascular;;   CARDIAC CATHETERIZATION     CHOLECYSTECTOMY N/A 02/27/2016   Procedure: LAPAROSCOPIC CHOLECYSTECTOMY;  Surgeon: Kieth Brightly, MD;  Location: [facility] ORS;  Service: General;  Laterality: N/A;   CLIPPING OF ATRIAL APPENDAGE  04/29/2020   Procedure: CLIPPING OF ATRIAL APPENDAGE USING ATRICURE  PRO2 CLIP SIZE ;  Surgeon: Purcell Nails, MD;  Location: [facility] OR;  Service: Open Heart Surgery;;   COLONOSCOPY WITH PROPOFOL N/A 09/26/2018   Procedure: COLONOSCOPY WITH PROPOFOL;  Surgeon: Midge Minium, MD;  Location: [facility] ENDOSCOPY;  Service: Endoscopy;  Laterality: N/A;   COLONOSCOPY WITH PROPOFOL N/A 07/16/2021   Procedure: COLONOSCOPY WITH PROPOFOL;  Surgeon: Midge Minium, MD;  Location: [facility] ENDOSCOPY;  Service: Endoscopy;  Laterality: N/A;   CYSTOSCOPY W/ RETROGRADES Right 08/18/2018   Procedure: CYSTOSCOPY WITH RETROGRADE PYELOGRAM;  Surgeon: Sondra Come, MD;  Location: [facility] ORS;  Service: Urology;  Laterality: Right;   CYSTOSCOPY/URETEROSCOPY/HOLMIUM LASER/STENT PLACEMENT Right 08/18/2018   Procedure: CYSTOSCOPY/URETEROSCOPY/STENT PLACEMENT;  Surgeon: Sondra Come, MD;  Location: [facility] ORS;  Service: Urology;  Laterality: Right;   DIAGNOSTIC LAPAROSCOPY     ESOPHAGOGASTRODUODENOSCOPY N/A 07/16/2021   Procedure: ESOPHAGOGASTRODUODENOSCOPY (EGD);  Surgeon: Midge Minium, MD;  Location: [facility] ENDOSCOPY;  Service: Endoscopy;  Laterality: N/A;   ESOPHAGOGASTRODUODENOSCOPY (EGD) WITH PROPOFOL N/A 09/26/2018   Procedure: ESOPHAGOGASTRODUODENOSCOPY (EGD) WITH PROPOFOL;  Surgeon: Midge Minium, MD;  Location: [facility] ENDOSCOPY;  Service: Endoscopy;  Laterality: N/A;   GIVENS CAPSULE STUDY N/A 11/03/2018   Procedure: GIVENS CAPSULE  STUDY;  Surgeon: Midge Minium, MD;  Location: [facility] ENDOSCOPY;  Service: Endoscopy;  Laterality: N/A;   JOINT REPLACEMENT Left    knee   KNEE ARTHROPLASTY Right 04/09/2019   Procedure: COMPUTER ASSISTED TOTAL KNEE ARTHROPLASTY;  Surgeon: Donato Heinz, MD;  Location: [facility] ORS;  Service: Orthopedics;  Laterality: Right;   KNEE CLOSED REDUCTION Left 04/15/2015   Procedure: CLOSED MANIPULATION KNEE;  Surgeon: Juanell Fairly, MD;  Location: [facility] ORS;  Service: Orthopedics;  Laterality: Left;   KNEE SURGERY     MAZE N/A 04/29/2020   Procedure: MAZE;  Surgeon: Purcell Nails, MD;  Location: [facility] OR;  Service: Open Heart Surgery;  Laterality: N/A;   MITRAL VALVE REPLACEMENT N/A 04/29/2020   Procedure: MITRAL VALVE (MV) REPLACEMENT USING ON-X VALVE SIZE 27/29MM;  Surgeon: Purcell Nails, MD;  Location: [facility] OR;  Service: Open Heart Surgery;  Laterality: N/A;   MULTIPLE EXTRACTIONS WITH ALVEOLOPLASTY N/A 02/28/2020   Procedure: MULTIPLE EXTRACTION WITH ALVEOLOPLASTY;  Surgeon: Sharman Cheek, DMD;  Location: [facility] OR;  Service: Dentistry;  Laterality: N/A;   RIGHT/LEFT HEART CATH AND CORONARY ANGIOGRAPHY Bilateral 09/06/2019   Procedure: RIGHT/LEFT HEART CATH AND CORONARY ANGIOGRAPHY;  Surgeon: Antonieta Iba, MD;  Location: [facility] INVASIVE CV LAB;  Service: Cardiovascular;  Laterality: Bilateral;   TEE WITHOUT CARDIOVERSION N/A 09/13/2017   Procedure: TRANSESOPHAGEAL ECHOCARDIOGRAM (TEE);  Surgeon: Yvonne Kendall, MD;  Location: [facility] ORS;  Service: Cardiovascular;  Laterality: N/A;   TEE WITHOUT CARDIOVERSION N/A 04/01/2020   Procedure: TRANSESOPHAGEAL ECHOCARDIOGRAM (TEE);  Surgeon: Parke Poisson, MD;  Location: [facility] ENDOSCOPY;  Service: Cardiovascular;  Laterality: N/A;   TEE WITHOUT CARDIOVERSION N/A 04/29/2020   Procedure: TRANSESOPHAGEAL ECHOCARDIOGRAM (TEE);  Surgeon: Purcell Nails, MD;  Location: [facility] OR;  Service: Open Heart Surgery;  Laterality: N/A;   TOTAL KNEE ARTHROPLASTY Left 12/25/2014    Procedure: TOTAL KNEE ARTHROPLASTY;  Surgeon: Juanell Fairly, MD;  Location: [facility] ORS;  Service: Orthopedics;  Laterality: Left;   TUBAL LIGATION      Review of Systems:    All systems reviewed and negative except where noted in HPI.   Physical Examination:   BP 115/67   Pulse 73   Temp (!) 97.5 F (36.4 C)   Ht 5\' 4"  (1.626 m)   Wt 244 lb 12.8 oz (111 kg)   BMI 42.02 kg/m  General: Well-nourished, well-developed in no acute distress.  Lungs: Clear to auscultation bilaterally. Non-labored. Heart: Regular rate and rhythm, no murmurs rubs or gallops.  Abdomen: Bowel sounds are normal; Abdomen is Soft and obese; moderate generalized upper abdominal tenderness.  No lower abdominal tenderness.  No hepatosplenomegaly, masses or hernias;  No Abdominal Tenderness; No guarding or rebound tenderness. Neuro: Alert and oriented x 3.  Grossly intact.  Psych: Alert and cooperative, normal mood and affect.   Imaging Studies: CT Renal Stone Study  Result Date: 09/14/2022 CLINICAL DATA:  Flank pain and abdominal bloating for 1 week. EXAM: CT ABDOMEN AND PELVIS WITHOUT CONTRAST TECHNIQUE: Multidetector CT imaging of the abdomen and pelvis was performed following the standard protocol without IV contrast. RADIATION DOSE REDUCTION: This exam was performed according to the departmental dose-optimization program which includes automated exposure control, adjustment of the mA and/or kV according to patient size and/or use of iterative reconstruction technique. COMPARISON:  Jun 11, 2022. FINDINGS: Lower chest: No acute abnormality. Hepatobiliary: No focal liver abnormality is seen. Status post cholecystectomy. No biliary dilatation. Pancreas: Unremarkable. No pancreatic ductal dilatation or surrounding inflammatory changes. Spleen: Normal in size without focal abnormality. Adrenals/Urinary Tract: Adrenal glands appear normal. Stable cyst seen in upper pole of right kidney force no further follow-up is  required. 8 mm rounded calculus which is nonobstructive. No hydronephrosis or renal obstruction is noted. Urinary bladder is unremarkable. Stomach/Bowel: Stomach is within normal limits. Appendix appears normal. No evidence of bowel wall thickening, distention, or inflammatory changes. Vascular/Lymphatic: Aortic atherosclerosis. No enlarged abdominal or pelvic lymph nodes. Reproductive: Status post hysterectomy. No adnexal masses. Other: No abdominal wall hernia or abnormality. No abdominopelvic ascites. Musculoskeletal: No acute or significant osseous findings. IMPRESSION: Stable nonobstructive right renal calculus. No hydronephrosis or renal obstruction. Aortic Atherosclerosis (ICD10-I70.0). Electronically Signed   By: Lupita Raider M.D.   On: 09/14/2022 16:21   DG Chest 2 View  Result Date: 09/14/2022 CLINICAL DATA:  Fatigue, shortness of breath, and abdominal bloating for the past week. EXAM: CHEST - 2 VIEW COMPARISON:  Chest x-ray dated Jun 11, 2022. FINDINGS: The heart size and mediastinal contours are within normal limits. Prior MVR and left atrial appendage clipping. Normal pulmonary vascularity. No focal consolidation, pleural effusion, or pneumothorax. No acute osseous abnormality. IMPRESSION: 1. No acute cardiopulmonary disease. Electronically Signed   By: Obie Dredge M.D.   On: 09/14/2022 13:11    Assessment and Plan:   Leslie [name] is a 57 y.o. y/o female returns for follow-up of  GERD / chronic gastritis Stop omeprazole Start pantoprazole 40 Mg 1 tablet once daily Add OTC Pepcid 20 Mg twice daily or Tums as needed. Recommend Lifestyle Modifications to prevent Acid Reflux.  Rec. Avoid coffee, sodas, peppermint, citrus fruits, and spicey foods.  Avoid eating 2-3 hours before bedtime.   Nausea - Side effect of Victoza? I advised patient to talk with her PCP about holding Victoza to see if nausea and upper GI symptoms improved.  Chronic Constipation Gave samples of Linzess 72  mcg QD for 1 week, then 145 mcg QD for 1 week, then 290 mcg QD for 1 week.  She will let me know which dose works best, and then she can call me back for a prescription.   [name], PA-C  Follow up in 3 months with TG.

## 2022-09-29 ENCOUNTER — Ambulatory Visit: Payer: 59 | Attending: Cardiovascular Disease

## 2022-09-29 DIAGNOSIS — I4891 Unspecified atrial fibrillation: Secondary | ICD-10-CM

## 2022-09-29 DIAGNOSIS — Z5181 Encounter for therapeutic drug level monitoring: Secondary | ICD-10-CM

## 2022-09-29 DIAGNOSIS — I050 Rheumatic mitral stenosis: Secondary | ICD-10-CM

## 2022-09-29 LAB — POCT INR: INR: 3 (ref 2.0–3.0)

## 2022-09-29 NOTE — Patient Instructions (Signed)
continue 1 tablet daily except 1/2 tablet on Mondays and Fridays. Stay consistent with greens.  - Recheck INR in 6 weeks [phone]

## 2022-10-02 ENCOUNTER — Other Ambulatory Visit: Payer: Self-pay | Admitting: Cardiovascular Disease

## 2022-10-04 NOTE — Telephone Encounter (Signed)
last visit on 04/26/22 with plan to f/u in 6 months.  Please schedule f/u.  Thanks

## 2022-10-05 ENCOUNTER — Encounter: Payer: Self-pay | Admitting: Cardiovascular Disease

## 2022-10-05 ENCOUNTER — Encounter: Payer: Self-pay | Admitting: Internal Medicine

## 2022-10-05 ENCOUNTER — Ambulatory Visit: Payer: 59 | Attending: Cardiovascular Disease | Admitting: Cardiovascular Disease

## 2022-10-05 VITALS — BP 132/56 | HR 65 | Ht 64.0 in | Wt 247.4 lb

## 2022-10-05 DIAGNOSIS — E785 Hyperlipidemia, unspecified: Secondary | ICD-10-CM

## 2022-10-05 DIAGNOSIS — I050 Rheumatic mitral stenosis: Secondary | ICD-10-CM

## 2022-10-05 DIAGNOSIS — I4891 Unspecified atrial fibrillation: Secondary | ICD-10-CM

## 2022-10-05 DIAGNOSIS — K219 Gastro-esophageal reflux disease without esophagitis: Secondary | ICD-10-CM | POA: Diagnosis not present

## 2022-10-05 DIAGNOSIS — I5032 Chronic diastolic (congestive) heart failure: Secondary | ICD-10-CM | POA: Diagnosis not present

## 2022-10-05 DIAGNOSIS — Z954 Presence of other heart-valve replacement: Secondary | ICD-10-CM

## 2022-10-05 MED ORDER — METOPROLOL SUCCINATE ER 100 MG PO TB24: ORAL_TABLET | ORAL | 3 refills | Status: DC

## 2022-10-05 MED ORDER — SPIRONOLACTONE 25 MG PO TABS: 25.0000 mg | ORAL_TABLET | Freq: Every day | ORAL | 3 refills | Status: DC

## 2022-10-05 MED ORDER — ATORVASTATIN CALCIUM 80 MG PO TABS
80.0000 mg | ORAL_TABLET | Freq: Every day | ORAL | 3 refills | Status: DC
Start: 2022-10-05 — End: 2023-12-08

## 2022-10-05 MED ORDER — AMLODIPINE BESYLATE 5 MG PO TABS: 5.0000 mg | ORAL_TABLET | Freq: Every day | ORAL | 3 refills | Status: DC

## 2022-10-05 MED ORDER — TORSEMIDE 20 MG PO TABS
20.0000 mg | ORAL_TABLET | Freq: Two times a day (BID) | ORAL | 3 refills | Status: DC
Start: 2022-10-05 — End: 2023-06-28

## 2022-10-05 MED ORDER — PANTOPRAZOLE SODIUM 40 MG PO TBEC
40.0000 mg | DELAYED_RELEASE_TABLET | Freq: Every day | ORAL | 2 refills | Status: DC
Start: 2022-10-05 — End: 2022-11-15

## 2022-10-05 NOTE — Telephone Encounter (Signed)
Pt scheduled on 9/17

## 2022-10-05 NOTE — Progress Notes (Signed)
Date:  10/05/2022   ID:  Leslie [name], DOB [birth year], MRN [id number]   PCP:  Carlean Jews, PA-C  Cardiologist:  Julien Nordmann, MD  Electrophysiologist:  None   Evaluation Performed:  Follow-Up Visit   Chief Complaint  Patient presents with   Follow-up    Patient c/o of increased SOB X4days    History of Present Illness:    Leslie [name] is a 57 y.o. female with  mitral valve stenosis, s/p MVR, MAZE morbid obesity,  Snores, possible OSA anxiety,  hyperlipidemia,  Hospital admission 09/11/2017 for shortness of breath chest tightness in atrial fibrillation with RVR Had TEE  , Cardioversion Mild COPD, long hx of smoking Cardiac CTA Coronary calcium score of 10. 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, 1, takes for a lot of her time, prostate cancer  Reports she is taking Torsemide 40 daily, Sometimes extra torsemide 40 mg as needed for edema around the feet, SOB  Denies significant chest pain Denies significant episodes of tachycardia Weight running high  Lab work reviewed A1C 5.9 HGB 10.6  EKG personally reviewed by myself on todays visit EKG Interpretation Date/Time:  Tuesday October 05 2022 14:54:26 EDT Ventricular Rate:  65 PR Interval:  176 QRS Duration:  80 QT Interval:  416 QTC Calculation: 432 R Axis:   39  Text Interpretation: Normal sinus rhythm Normal ECG When compared with ECG of 14-Sep-2022 15:36, No significant change was found Confirmed by Julien Nordmann [phone]) on 10/05/2022 3:16:53 PM   Other past medical history reviewed 04/29/2020 she underwent median sternotomy, mitral valve replacement, MAZE.   Coumadin was initiated due to mechanical MVR  Echo July 2023 Normal EF, stable mechanical mitral valve Right heart  pressures estimated 42, ,  mitral valve mean gradient 4 mmHg  Flu 12/23, treated with tamiflu And follow-up she reports that she is sick again, in bed 4 days with viral symptoms Reports having cramping, entire body: comes and goes  H/A, started on on ABX for sinus infection, doxycycline, started in the past day or so  Taking mucinex and tylenol  Out on disability for her cardiac issues   left upper extremity ulnar nerve surgery done on 8/15 by Dr. Gilman Schmidt complicated by postop bleeding  Dressing change in office on 8/19 showed bleeding so she was sent to ED for ongoing surgical site bleeding Hemoglobin dropped to 7.7 S/p 1 unit PRBC  heparin bridge while in the hospital  Underwent repeat surgery for hematoma in September 2022, off warfarin, had Lovenox bridge Reports still has some numbness in her hand, has not started PT yet  transesophageal echocardiogram 04/01/2020-confirming rheumatic heart disease with severe mitral stenosis.    On trosemide 40/20 daily (3 a day)  Echo 05/2020 Close to normal cardiac function Prosthetic mitral valve working well  Echocardiogram August 2020  mitral valve Moderate thickening of the mitral valve leaflet. Moderate-severe mitral valve stenosis, with mean gradient of 16 mmHg and MVA by continuity equation of 1.0 cm^2.    Transesophageal echo August 2019 with moderate mitral valve stenosis mean gradient of 14 Mobility of the posterior leaflet was restricted.    summer 2020 developed right flank pain,   underwent right ureteroscopy, laser lithotripsy, and stent placement on 08/18/2018.  Had pyelonephritis post procedure  Creatinine trended up  to 4.1  acute kidney injury and sepsis in the setting of recent right ureteral stone status post lithotripsy and likely pyelonephritis.   atrial fibrillation December 2019 01/05/2018 awoke suddenly with tachypalpitations  rates in the 130s   EMS was called, noted to be in sinus rhythm with heart rates  in the 80s to 90s bpm.     Cardiac CTA 1. Coronary calcium score of 10. This was 53 percentile for age and sex matched control.. Normal coronary origin with right dominance.Mild non-obstructive CAD.  Past Medical History:  Diagnosis Date   (HFpEF) heart failure with preserved ejection fraction (HCC)    a. 08/2017 Echo: EF 55-60%.  Grade 2 diastolic dysfunction; b. 05/2020 Echo: EF 50-55%, no rwma, Nl RV fxn, nl fxn'ing mech MV.   Allergy    Anemia    Anxiety    Asthma    BRCA negative 03/22/2013   Bronchitis 02/19/2016   ON LEVAQUIN PO   Chest tightness    a. 08/2019 Cath: nl cors.   Cigarette smoker 09/11/2017   8-10 day   Dysrhythmia    GERD (gastroesophageal reflux disease)    History of kidney stones    Hyperlipidemia    Hypertension    Interstitial lung disease (HCC)    a. CT 2013 b. 02/2018 CXR noted recurrent intersistial changes ILD vs chronic bronchitis   Moderate mitral stenosis    a.  08/2017 TEE: EF 60 to 65%.  Moderate mitral stenosis.  Mean gradient 14 mmHg.  Valve area 2.59 cm by planimetry, 2.72 cm by pressure half-time.   PAF (paroxysmal atrial fibrillation) (HCC)    a. 08/2017 s/p TEE/DCCV; b. CHA2DS2VASc = 2-->warfarin.   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 04/2020 s/p 27/29 mm Onyx mech mitral valve-->chronic coumadin; b. 05/2020 Echo: Nl fxn'ing mech MV.   Past Surgical History:  Procedure Laterality Date   ABDOMINAL HYSTERECTOMY     total   ABDOMINAL HYSTERECTOMY     BREAST BIOPSY Bilateral 2012   BREAST BIOPSY Right 08-07-12   fibroadenomatous changes and columnar cells   BREAST BIOPSY  02/03/2015   stereo byrnett   BUBBLE STUDY  04/01/2020   Procedure: BUBBLE STUDY;  Surgeon: Parke Poisson, MD;  Location: [facility]  ENDOSCOPY;  Service: Cardiovascular;;   CARDIAC CATHETERIZATION     CHOLECYSTECTOMY N/A 02/27/2016   Procedure: LAPAROSCOPIC CHOLECYSTECTOMY;  Surgeon: Kieth Brightly, MD;  Location: [facility] ORS;  Service: General;  Laterality: N/A;   CLIPPING OF ATRIAL APPENDAGE  04/29/2020   Procedure: CLIPPING OF ATRIAL APPENDAGE USING ATRICURE  PRO2 CLIP SIZE ;  Surgeon: Purcell Nails, MD;  Location: [facility] OR;  Service: Open Heart Surgery;;   COLONOSCOPY WITH PROPOFOL N/A 09/26/2018   Procedure: COLONOSCOPY WITH PROPOFOL;  Surgeon: Midge Minium, MD;  Location: [facility] ENDOSCOPY;  Service: Endoscopy;  Laterality: N/A;   COLONOSCOPY WITH PROPOFOL N/A 07/16/2021   Procedure: COLONOSCOPY WITH PROPOFOL;  Surgeon: Midge Minium, MD;  Location: [facility] ENDOSCOPY;  Service: Endoscopy;  Laterality: N/A;   CYSTOSCOPY W/ RETROGRADES Right 08/18/2018   Procedure: CYSTOSCOPY WITH RETROGRADE PYELOGRAM;  Surgeon: Sondra Come, MD;  Location: [facility] ORS;  Service: Urology;  Laterality: Right;   CYSTOSCOPY/URETEROSCOPY/HOLMIUM LASER/STENT PLACEMENT Right 08/18/2018   Procedure: CYSTOSCOPY/URETEROSCOPY/STENT PLACEMENT;  Surgeon: Sondra Come, MD;  Location: [facility] ORS;  Service: Urology;  Laterality: Right;   DIAGNOSTIC LAPAROSCOPY     ESOPHAGOGASTRODUODENOSCOPY N/A 07/16/2021   Procedure: ESOPHAGOGASTRODUODENOSCOPY (EGD);  Surgeon: Midge Minium, MD;  Location: [facility] ENDOSCOPY;  Service: Endoscopy;  Laterality: N/A;   ESOPHAGOGASTRODUODENOSCOPY (EGD) WITH PROPOFOL N/A 09/26/2018   Procedure: ESOPHAGOGASTRODUODENOSCOPY (EGD) WITH PROPOFOL;  Surgeon: Midge Minium, MD;  Location: [facility] ENDOSCOPY;  Service: Endoscopy;  Laterality: N/A;   GIVENS CAPSULE STUDY N/A 11/03/2018   Procedure: GIVENS CAPSULE STUDY;  Surgeon: Midge Minium, MD;  Location: [facility] ENDOSCOPY;  Service: Endoscopy;  Laterality: N/A;   JOINT REPLACEMENT Left    knee   KNEE ARTHROPLASTY Right 04/09/2019   Procedure: COMPUTER ASSISTED TOTAL KNEE ARTHROPLASTY;  Surgeon:  Donato Heinz, MD;  Location: [facility] ORS;  Service: Orthopedics;  Laterality: Right;   KNEE CLOSED REDUCTION Left 04/15/2015   Procedure: CLOSED MANIPULATION KNEE;  Surgeon: Juanell Fairly, MD;  Location: [facility] ORS;  Service: Orthopedics;  Laterality: Left;   KNEE SURGERY     MAZE N/A 04/29/2020   Procedure: MAZE;  Surgeon: Purcell Nails, MD;  Location: [facility] OR;  Service: Open Heart Surgery;  Laterality: N/A;   MITRAL VALVE REPLACEMENT N/A 04/29/2020   Procedure: MITRAL VALVE (MV) REPLACEMENT USING ON-X VALVE SIZE 27/29MM;  Surgeon: Purcell Nails, MD;  Location: [facility] OR;  Service: Open Heart Surgery;  Laterality: N/A;   MULTIPLE EXTRACTIONS WITH ALVEOLOPLASTY N/A 02/28/2020   Procedure: MULTIPLE EXTRACTION WITH ALVEOLOPLASTY;  Surgeon: Sharman Cheek, DMD;  Location: [facility] OR;  Service: Dentistry;  Laterality: N/A;   RIGHT/LEFT HEART CATH AND CORONARY ANGIOGRAPHY Bilateral 09/06/2019   Procedure: RIGHT/LEFT HEART CATH AND CORONARY ANGIOGRAPHY;  Surgeon: Antonieta Iba, MD;  Location: [facility] INVASIVE CV LAB;  Service: Cardiovascular;  Laterality: Bilateral;   TEE WITHOUT CARDIOVERSION N/A 09/13/2017   Procedure: TRANSESOPHAGEAL ECHOCARDIOGRAM (TEE);  Surgeon: Yvonne Kendall, MD;  Location: [facility] ORS;  Service: Cardiovascular;  Laterality: N/A;   TEE WITHOUT CARDIOVERSION N/A 04/01/2020   Procedure: TRANSESOPHAGEAL ECHOCARDIOGRAM (TEE);  Surgeon: Parke Poisson, MD;  Location: [facility] ENDOSCOPY;  Service: Cardiovascular;  Laterality: N/A;   TEE WITHOUT CARDIOVERSION N/A 04/29/2020   Procedure: TRANSESOPHAGEAL ECHOCARDIOGRAM (TEE);  Surgeon: Purcell Nails, MD;  Location: [facility] OR;  Service: Open Heart Surgery;  Laterality: N/A;   TOTAL KNEE ARTHROPLASTY Left 12/25/2014   Procedure: TOTAL KNEE ARTHROPLASTY;  Surgeon: Juanell Fairly, MD;  Location: [facility] ORS;  Service: Orthopedics;  Laterality: Left;   TUBAL LIGATION       Current Meds  Medication Sig   ALPRAZolam (XANAX) 0.5 MG tablet TAKE 1/2 TABLET  BY MOUTH AT BEDTIME AS NEEDED FOR ANXIETY   amLODipine (NORVASC) 5 MG tablet Take 5 mg by mouth daily.   aspirin 81 MG EC tablet Take 81 mg by mouth daily.   atorvastatin (LIPITOR) 80 MG tablet Take 1 tablet (80 mg total) by mouth daily.   calcium carbonate (TUMS - DOSED IN MG ELEMENTAL CALCIUM) 500 MG chewable tablet Chew 500 mg by mouth daily as needed for indigestion or heartburn.   Insulin Pen Needle 32G X 6 MM MISC Use with victoza   linaclotide (LINZESS) 145 MCG CAPS capsule Take 1 capsule (145 mcg total) by mouth daily before breakfast.   metoprolol succinate (TOPROL-XL) 100 MG 24 hr tablet TAKE 1 TABLET(100 MG) BY MOUTH TWICE DAILY WITH OR IMMEDIATELY FOLLOWING A MEAL/PLEASE CALL OFFICE TO SCHEDULE APPOINTMENT PRIOR TO NEXT REFILL   potassium chloride SA (KLOR-CON  M20) 20 MEQ tablet TAKE 2 TABLETS BY MOUTH TWICE A DAY   spironolactone (ALDACTONE) 25 MG tablet Take 1 tablet (25 mg total) by mouth daily.   torsemide (DEMADEX) 20 MG tablet TAKE 1 TABLET BY MOUTH TWICE A DAY   traZODone (DESYREL) 50 MG tablet Take 1 tablet (50 mg total) by mouth at bedtime.   Vitamin D, Ergocalciferol, (DRISDOL) 1.25 MG (50000 UNIT) CAPS capsule TAKE 1 CAPSULE BY MOUTH ONE TIME PER WEEK   warfarin (COUMADIN) 2 MG tablet TAKE 1/2 TO 1 TABLET BY MOUTH DAILY AS DIRECTED BY THE ANTI-COAG CLINIC (Patient taking differently: Take 1-2 mg by mouth See admin instructions. Take 2 mg every day every day except on Sunday Thursday take 1 mg per patient)     Allergies:   Morphine, Oxycodone hcl, and Dilaudid [hydromorphone hcl]   Social History   Tobacco Use   Smoking status: Former    Current packs/day: 0.00    Average packs/day: 0.5 packs/day for 20.0 years (10.0 ttl pk-yrs)    Types: Cigarettes    Start date: 01/1996    Quit date: 01/2016    Years since quitting: 6.7   Smokeless tobacco: Never  Vaping Use   Vaping status: Never Used  Substance Use Topics   Alcohol use: Not Currently    Comment: rarely    Drug use: No     Family Hx: The patient's family history includes Breast cancer in her maternal grandmother and mother; Cancer in her maternal aunt and maternal grandmother; Cancer (age of onset: 59) in her mother; Hypertension in her father and mother; Osteoarthritis in her father.  ROS:   Please see the history of present illness.    Review of Systems  Constitutional: Negative.   HENT: Negative.    Respiratory: Negative.    Cardiovascular: Negative.   Gastrointestinal: Negative.   Musculoskeletal: Negative.   Neurological: Negative.   Psychiatric/Behavioral: Negative.    All other systems reviewed and are negative.   Labs/Other Tests and Data Reviewed:    Recent Labs: 12/14/2021: Magnesium 1.8 05/06/2022: ALT 28 06/25/2022: TSH 0.629 09/14/2022: B Natriuretic Peptide 113.1; BUN 29; Creatinine, Ser 1.93; Hemoglobin 10.7; Platelets 299; Potassium 4.1; Sodium 138   Recent Lipid Panel Lab Results  Component Value Date/Time   CHOL 160 06/25/2022 10:12 AM   CHOL 170 06/26/2021 09:12 AM   TRIG 185 (H) 06/25/2022 10:12 AM   HDL 33 (L) 06/25/2022 10:12 AM   HDL 36 (L) 06/26/2021 09:12 AM   CHOLHDL 4.8 06/25/2022 10:12 AM   LDLCALC 90 06/25/2022 10:12 AM   LDLCALC 106 (H) 06/26/2021 09:12 AM    Wt Readings from Last 3 Encounters:  10/05/22 247 lb 6.4 oz (112.2 kg)  09/23/22 244 lb 12.8 oz (111 kg)  09/16/22 246 lb (111.6 kg)     Objective:    Vital Signs:  BP (!) 132/56 (BP Location: Left Arm, Patient Position: Sitting, Cuff Size: Large)   Pulse 65   Ht 5\' 4"  (1.626 m)   Wt 247 lb 6.4 oz (112.2 kg)   SpO2 98%   BMI 42.47 kg/m   Constitutional:  oriented to person, place, and time. No distress.  Obese HENT:  Head: Grossly normal Eyes:  no discharge. No scleral icterus.  Neck: No JVD, no carotid bruits  Cardiovascular: Regular rate and rhythm, no murmurs appreciated Pulmonary/Chest: Clear to auscultation bilaterally, no wheezes or rails Abdominal: Soft.  no  distension.  no tenderness.  Musculoskeletal: Normal range of motion Neurological:  normal muscle tone. Coordination normal. No atrophy Skin: Skin warm and dry Psychiatric: normal affect, pleasant   ASSESSMENT & PLAN:    Mitral valve stenosis moderate pulmonary hypertension S/p replacement, on warfarin Echocardiogram July 2023 well-functioning valve,  with mild to moderately elevated right heart pressures Appears euvolemic, continue torsemide 40 daily with extra torsemide as needed  Chronic diastolic CHF Ejection fraction estimated 60 to 65% on echo July 2023 Symptoms exacerbated by obesity, underlying mitral valve disease Continue plan as above torsemide 40 daily with extra as needed   Paroxysmal atrial fibrillation Pending normal sinus rhythm Will continue metoprolol succinate 100 twice daily Status post maze Tolerating warfarin  Morbid obesity We have encouraged continued exercise, careful diet management in an effort to lose weight.     Total encounter time more than 30 minutes  Greater than 50% was spent in counseling and coordination of care with the patient   Signed, Julien Nordmann, MD  10/05/2022 3:16 PM    [facility]

## 2022-10-05 NOTE — Patient Instructions (Signed)
Medication Instructions:  No changes  If you need a refill on your cardiac medications before your next appointment, please call your pharmacy.   Lab work: No new labs needed  Testing/Procedures: No new testing needed  Follow-Up: At [facility], you and your health needs are our priority.  As part of our continuing mission to provide you with exceptional heart care, we have created designated Provider Care Teams.  These Care Teams include your primary Cardiologist (physician) and Advanced Practice Providers (APPs -  Physician Assistants and Nurse Practitioners) who all work together to provide you with the care you need, when you need it.  You will need a follow up appointment in 12 months  Providers on your designated Care Team:   Christopher Berge, NP Ryan Dunn, PA-C Cadence Furth, PA-C  COVID-19 Vaccine Information can be found at: [email or web address] For questions related to vaccine distribution or appointments, please email [email or web address] or call [phone].   

## 2022-10-06 ENCOUNTER — Other Ambulatory Visit: Payer: Self-pay | Admitting: Physician Assistant

## 2022-10-06 DIAGNOSIS — E1165 Type 2 diabetes mellitus with hyperglycemia: Secondary | ICD-10-CM

## 2022-10-08 ENCOUNTER — Other Ambulatory Visit: Payer: Self-pay | Admitting: Physician Assistant

## 2022-10-08 DIAGNOSIS — G4709 Other insomnia: Secondary | ICD-10-CM

## 2022-10-11 ENCOUNTER — Ambulatory Visit: Payer: Medicaid Other

## 2022-10-12 ENCOUNTER — Ambulatory Visit (INDEPENDENT_AMBULATORY_CARE_PROVIDER_SITE_OTHER): Payer: 59

## 2022-10-12 DIAGNOSIS — E538 Deficiency of other specified B group vitamins: Secondary | ICD-10-CM | POA: Diagnosis not present

## 2022-10-12 MED ORDER — CYANOCOBALAMIN 1000 MCG/ML IJ SOLN
1000.0000 ug | Freq: Once | INTRAMUSCULAR | Status: AC
Start: 2022-10-12 — End: 2022-10-12
  Administered 2022-10-12: 1000 ug via INTRAMUSCULAR

## 2022-10-25 ENCOUNTER — Other Ambulatory Visit
Admission: RE | Admit: 2022-10-25 | Discharge: 2022-10-25 | Disposition: A | Discharge status: Home / Self Care | Payer: Medicare Other | Attending: Nephrology | Admitting: Nephrology

## 2022-10-25 ENCOUNTER — Encounter: Payer: Self-pay | Admitting: Physician Assistant

## 2022-10-25 ENCOUNTER — Encounter: Payer: Self-pay | Admitting: Internal Medicine

## 2022-10-25 ENCOUNTER — Ambulatory Visit (INDEPENDENT_AMBULATORY_CARE_PROVIDER_SITE_OTHER): Payer: Medicare Other | Admitting: Physician Assistant

## 2022-10-25 VITALS — BP 120/70 | HR 84 | Temp 98.3°F | Resp 16 | Ht 64.0 in | Wt 245.2 lb

## 2022-10-25 DIAGNOSIS — E1165 Type 2 diabetes mellitus with hyperglycemia: Secondary | ICD-10-CM | POA: Diagnosis not present

## 2022-10-25 DIAGNOSIS — N2581 Secondary hyperparathyroidism of renal origin: Secondary | ICD-10-CM | POA: Insufficient documentation

## 2022-10-25 DIAGNOSIS — N200 Calculus of kidney: Secondary | ICD-10-CM | POA: Diagnosis not present

## 2022-10-25 DIAGNOSIS — G4709 Other insomnia: Secondary | ICD-10-CM

## 2022-10-25 DIAGNOSIS — I129 Hypertensive chronic kidney disease with stage 1 through stage 4 chronic kidney disease, or unspecified chronic kidney disease: Secondary | ICD-10-CM | POA: Diagnosis present

## 2022-10-25 DIAGNOSIS — I10 Essential (primary) hypertension: Secondary | ICD-10-CM

## 2022-10-25 DIAGNOSIS — E876 Hypokalemia: Secondary | ICD-10-CM | POA: Diagnosis not present

## 2022-10-25 DIAGNOSIS — E790 Hyperuricemia without signs of inflammatory arthritis and tophaceous disease: Secondary | ICD-10-CM | POA: Diagnosis present

## 2022-10-25 DIAGNOSIS — R600 Localized edema: Secondary | ICD-10-CM | POA: Insufficient documentation

## 2022-10-25 DIAGNOSIS — N184 Chronic kidney disease, stage 4 (severe): Secondary | ICD-10-CM | POA: Insufficient documentation

## 2022-10-25 LAB — CBC WITH DIFFERENTIAL/PLATELET
Abs Immature Granulocytes: 0.02 10*3/uL (ref 0.00–0.07)
Basophils Absolute: 0.1 10*3/uL (ref 0.0–0.1)
Basophils Relative: 1 %
Eosinophils Absolute: 0.1 10*3/uL (ref 0.0–0.5)
Eosinophils Relative: 2 %
HCT: 33.2 % — ABNORMAL LOW (ref 36.0–46.0)
Hemoglobin: 10.7 g/dL — ABNORMAL LOW (ref 12.0–15.0)
Immature Granulocytes: 0 %
Lymphocytes Relative: 34 %
Lymphs Abs: 1.9 10*3/uL (ref 0.7–4.0)
MCH: 28.8 pg (ref 26.0–34.0)
MCHC: 32.2 g/dL (ref 30.0–36.0)
MCV: 89.5 fL (ref 80.0–100.0)
Monocytes Absolute: 0.5 10*3/uL (ref 0.1–1.0)
Monocytes Relative: 9 %
Neutro Abs: 3.1 10*3/uL (ref 1.7–7.7)
Neutrophils Relative %: 54 %
Platelets: 302 10*3/uL (ref 150–400)
RBC: 3.71 MIL/uL — ABNORMAL LOW (ref 3.87–5.11)
RDW: 12.2 % (ref 11.5–15.5)
WBC: 5.6 10*3/uL (ref 4.0–10.5)
nRBC: 0 % (ref 0.0–0.2)

## 2022-10-25 LAB — RENAL FUNCTION PANEL
Albumin: 4.2 g/dL (ref 3.5–5.0)
Anion gap: 9 (ref 5–15)
BUN: 29 mg/dL — ABNORMAL HIGH (ref 6–20)
CO2: 29 mmol/L (ref 22–32)
Calcium: 9 mg/dL (ref 8.9–10.3)
Chloride: 101 mmol/L (ref 98–111)
Creatinine, Ser: 2.09 mg/dL — ABNORMAL HIGH (ref 0.44–1.00)
GFR, Estimated: 27 mL/min — ABNORMAL LOW (ref >60)
Glucose, Bld: 116 mg/dL — ABNORMAL HIGH (ref 70–99)
Phosphorus: 3.3 mg/dL (ref 2.5–4.6)
Potassium: 3.9 mmol/L (ref 3.5–5.1)
Sodium: 139 mmol/L (ref 135–145)

## 2022-10-25 LAB — MAGNESIUM: Magnesium: 2.1 mg/dL (ref 1.7–2.4)

## 2022-10-25 LAB — URIC ACID: Uric Acid, Serum: 9 mg/dL — ABNORMAL HIGH (ref 2.5–7.1)

## 2022-10-25 MED ORDER — TRULICITY 0.75 MG/0.5ML ~~LOC~~ SOAJ
0.7500 mg | SUBCUTANEOUS | 2 refills | Status: DC
Start: 2022-10-25 — End: 2022-12-06

## 2022-10-25 NOTE — Progress Notes (Signed)
[facility] [address] [place], [place] [address]  Internal MEDICINE  Office Visit Note  Patient Name: Leslie [name]  [id number]  [id number]  Date of Service: 11/03/2022  Chief Complaint  Patient presents with   Follow-up   Gastroesophageal Reflux   Hypertension   Hyperlipidemia    HPI Pt is here for routine follow up -trazodone not helping at 50mg  so stopped after a few days. Did not increase dose. Realized she had tried this a long time ago -States her sleep disturbance is more so from her husband snoring and bed making noise and is working on this. -insurance changed to [employer] and getting part D now, wants to try alternative weekly GLP1 to see if covered -Nephrology appt soon -wants to hold off on sleep study until more familiar with insurance details -more pain in knees recently  Current Medication: Outpatient Encounter Medications as of 10/25/2022  Medication Sig   ALPRAZolam (XANAX) 0.5 MG tablet TAKE 1/2 TABLET BY MOUTH AT BEDTIME AS NEEDED FOR ANXIETY   amLODipine (NORVASC) 5 MG tablet Take 1 tablet (5 mg total) by mouth daily.   aspirin 81 MG EC tablet Take 81 mg by mouth daily.   atorvastatin (LIPITOR) 80 MG tablet Take 1 tablet (80 mg total) by mouth daily.   calcium carbonate (TUMS - DOSED IN MG ELEMENTAL CALCIUM) 500 MG chewable tablet Chew 500 mg by mouth daily as needed for indigestion or heartburn.   chlorpheniramine-HYDROcodone (TUSSIONEX) 10-8 MG/5ML Take 5 mLs by mouth every 12 (twelve) hours as needed.   Dulaglutide (TRULICITY) 0.75 MG/0.5ML SOPN Inject 0.75 mg into the skin once a week.   Insulin Pen Needle 32G X 6 MM MISC Use with victoza   linaclotide (LINZESS) 145 MCG CAPS capsule Take 1 capsule (145 mcg total) by mouth daily before breakfast.   losartan (COZAAR) 50 MG tablet Take 50 mg by mouth daily.   nitrofurantoin, macrocrystal-monohydrate, (MACROBID) 100 MG capsule Take 1 capsule (100 mg total) by mouth 2 (two) times daily.    ondansetron (ZOFRAN) 8 MG tablet Take 8 mg by mouth every 8 (eight) hours as needed.   pantoprazole (PROTONIX) 40 MG tablet Take 1 tablet (40 mg total) by mouth daily.   potassium chloride SA (KLOR-CON M20) 20 MEQ tablet TAKE 2 TABLETS BY MOUTH TWICE A DAY   spironolactone (ALDACTONE) 25 MG tablet Take 1 tablet (25 mg total) by mouth daily.   torsemide (DEMADEX) 20 MG tablet Take 1 tablet (20 mg total) by mouth 2 (two) times daily.   traZODone (DESYREL) 50 MG tablet TAKE 1 TABLET BY MOUTH EVERYDAY AT BEDTIME   Vitamin D, Ergocalciferol, (DRISDOL) 1.25 MG (50000 UNIT) CAPS capsule TAKE 1 CAPSULE BY MOUTH ONE TIME PER WEEK   warfarin (COUMADIN) 2 MG tablet TAKE 1/2 TO 1 TABLET BY MOUTH DAILY AS DIRECTED BY THE ANTI-COAG CLINIC (Patient taking differently: Take 1-2 mg by mouth See admin instructions. Take 2 mg every day every day except on Sunday Thursday take 1 mg per patient)   [DISCONTINUED] liraglutide (VICTOZA) 18 MG/3ML SOPN START 0.6MG  UNDER THE SKIN ONCE A DAY FOR 7 DAYS, THEN INCREASE TO 1.2MG  ONCE A DAY   [DISCONTINUED] metoprolol succinate (TOPROL-XL) 100 MG 24 hr tablet TAKE 1 TABLET(100 MG) BY MOUTH TWICE DAILY WITH OR IMMEDIATELY FOLLOWING A MEAL/PLEASE CALL OFFICE TO SCHEDULE APPOINTMENT PRIOR TO NEXT REFILL   No facility-administered encounter medications on file as of 10/25/2022.    Surgical History: Past Surgical History:  Procedure  Laterality Date   ABDOMINAL HYSTERECTOMY     total   ABDOMINAL HYSTERECTOMY     BREAST BIOPSY Bilateral 2012   BREAST BIOPSY Right 08-07-12   fibroadenomatous changes and columnar cells   BREAST BIOPSY  02/03/2015   stereo byrnett   BUBBLE STUDY  04/01/2020   Procedure: BUBBLE STUDY;  Surgeon: Parke Poisson, MD;  Location: [facility] END[facility]COPY;  Service: Cardiovascular;;   CARDIAC CATHETERIZATION     CHOLECYSTECTOMY N/A 02/27/2016   Procedure: LAPAROSCOPIC CHOLECYSTECTOMY;  Surgeon: Kieth Brightly, MD;  Location: [facility] ORS;  Service: General;   Laterality: N/A;   CLIPPING OF ATRIAL APPENDAGE  04/29/2020   Procedure: CLIPPING OF ATRIAL APPENDAGE USING ATRICURE  PRO2 CLIP SIZE ;  Surgeon: Purcell Nails, MD;  Location: [facility];  Service: Open Heart Surgery;;   COLONOSCOPY WITH PROPOFOL N/A 09/26/2018   Procedure: COLONOSCOPY WITH PROPOFOL;  Surgeon: Midge Minium, MD;  Location: [facility] ENDOSCOPY;  Service: Endoscopy;  Laterality: N/A;   COLONOSCOPY WITH PROPOFOL N/A 07/16/2021   Procedure: COLONOSCOPY WITH PROPOFOL;  Surgeon: Midge Minium, MD;  Location: [facility] ENDOSCOPY;  Service: Endoscopy;  Laterality: N/A;   CYSTOSCOPY W/ RETROGRADES Right 08/18/2018   Procedure: CYSTOSCOPY WITH RETROGRADE PYELOGRAM;  Surgeon: Sondra Come, MD;  Location: [facility] ORS;  Service: Urology;  Laterality: Right;   CYSTOSCOPY/URETEROSCOPY/HOLMIUM LASER/STENT PLACEMENT Right 08/18/2018   Procedure: CYSTOSCOPY/URETEROSCOPY/STENT PLACEMENT;  Surgeon: Sondra Come, MD;  Location: [facility] ORS;  Service: Urology;  Laterality: Right;   DIAGNOSTIC LAPAROSCOPY     ESOPHAGOGASTRODUODENOSCOPY N/A 07/16/2021   Procedure: ESOPHAGOGASTRODUODENOSCOPY (EGD);  Surgeon: Midge Minium, MD;  Location: [facility] ENDOSCOPY;  Service: Endoscopy;  Laterality: N/A;   ESOPHAGOGASTRODUODENOSCOPY (EGD) WITH PROPOFOL N/A 09/26/2018   Procedure: ESOPHAGOGASTRODUODENOSCOPY (EGD) WITH PROPOFOL;  Surgeon: Midge Minium, MD;  Location: [facility] ENDOSCOPY;  Service: Endoscopy;  Laterality: N/A;   GIVENS CAPSULE STUDY N/A 11/03/2018   Procedure: GIVENS CAPSULE STUDY;  Surgeon: Midge Minium, MD;  Location: [facility] ENDOSCOPY;  Service: Endoscopy;  Laterality: N/A;   JOINT REPLACEMENT Left    knee   KNEE ARTHROPLASTY Right 04/09/2019   Procedure: COMPUTER ASSISTED TOTAL KNEE ARTHROPLASTY;  Surgeon: Donato Heinz, MD;  Location: [facility] ORS;  Service: Orthopedics;  Laterality: Right;   KNEE CLOSED REDUCTION Left 04/15/2015   Procedure: CLOSED MANIPULATION KNEE;  Surgeon: Juanell Fairly, MD;  Location: [facility] ORS;   Service: Orthopedics;  Laterality: Left;   KNEE SURGERY     MAZE N/A 04/29/2020   Procedure: MAZE;  Surgeon: Purcell Nails, MD;  Location: [facility] OR;  Service: Open Heart Surgery;  Laterality: N/A;   MITRAL VALVE REPLACEMENT N/A 04/29/2020   Procedure: MITRAL VALVE (MV) REPLACEMENT USING ON-X VALVE SIZE 27/29MM;  Surgeon: Purcell Nails, MD;  Location: [facility] OR;  Service: Open Heart Surgery;  Laterality: N/A;   MULTIPLE EXTRACTIONS WITH ALVEOLOPLASTY N/A 02/28/2020   Procedure: MULTIPLE EXTRACTION WITH ALVEOLOPLASTY;  Surgeon: Sharman Cheek, DMD;  Location: [facility] OR;  Service: Dentistry;  Laterality: N/A;   RIGHT/LEFT HEART CATH AND CORONARY ANGIOGRAPHY Bilateral 09/06/2019   Procedure: RIGHT/LEFT HEART CATH AND CORONARY ANGIOGRAPHY;  Surgeon: Antonieta Iba, MD;  Location: [facility] INVASIVE CV LAB;  Service: Cardiovascular;  Laterality: Bilateral;   TEE WITHOUT CARDIOVERSION N/A 09/13/2017   Procedure: TRANSESOPHAGEAL ECHOCARDIOGRAM (TEE);  Surgeon: Yvonne Kendall, MD;  Location: [facility] ORS;  Service: Cardiovascular;  Laterality: N/A;   TEE WITHOUT CARDIOVERSION N/A 04/01/2020   Procedure: TRANSESOPHAGEAL ECHOCARDIOGRAM (TEE);  Surgeon: Parke Poisson, MD;  Location: [facility] ENDOSCOPY;  Service: Cardiovascular;  Laterality: N/A;   TEE WITHOUT CARDIOVERSION N/A 04/29/2020   Procedure: TRANSESOPHAGEAL ECHOCARDIOGRAM (TEE);  Surgeon: Purcell Nails, MD;  Location: [facility] OR;  Service: Open Heart Surgery;  Laterality: N/A;   TOTAL KNEE ARTHROPLASTY Left 12/25/2014   Procedure: TOTAL KNEE ARTHROPLASTY;  Surgeon: Juanell Fairly, MD;  Location: [facility] ORS;  Service: Orthopedics;  Laterality: Left;   TUBAL LIGATION      Medical History: Past Medical History:  Diagnosis Date   (HFpEF) heart failure with preserved ejection fraction (HCC)    a. 08/2017 Echo: EF 55-60%.  Grade 2 diastolic dysfunction; b. 05/2020 Echo: EF 50-55%, no rwma, Nl RV fxn, nl fxn'ing mech MV.   Allergy    Anemia    Anxiety    Asthma     BRCA negative 03/22/2013   Bronchitis 02/19/2016   ON LEVAQUIN PO   Chest tightness    a. 08/2019 Cath: nl cors.   Cigarette smoker 09/11/2017   8-10 day   Dysrhythmia    GERD (gastroesophageal reflux disease)    History of kidney stones    Hyperlipidemia    Hypertension    Interstitial lung disease (HCC)    a. CT 2013 b. 02/2018 CXR noted recurrent intersistial changes ILD vs chronic bronchitis   Moderate mitral stenosis    a.  08/2017 TEE: EF 60 to 65%.  Moderate mitral stenosis.  Mean gradient 14 mmHg.  Valve area 2.59 cm by planimetry, 2.72 cm by pressure half-time.   PAF (paroxysmal atrial fibrillation) (HCC)    a. 08/2017 s/p TEE/DCCV; b. CHA2DS2VASc = 2-->warfarin.   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 04/2020 s/p 27/29 mm Onyx mech mitral valve-->chronic coumadin; b. 05/2020 Echo: Nl fxn'ing mech MV.    Family History: Family History  Problem Relation Age of Onset   Breast cancer Mother    Cancer Mother 39       breast   Hypertension Mother    Osteoarthritis Father    Hypertension Father    Cancer Maternal Aunt        breast   Breast cancer Maternal Grandmother    Cancer Maternal Grandmother        breast    Social History   Socioeconomic History   Marital status: Married    Spouse name: Not on file   Number of children: Not on file   Years of education: Not on file   Highest education level: Not on file  Occupational History   Not on file  Tobacco Use   Smoking status: Former    Current packs/day: 0.00    Average packs/day: 0.5 packs/day for 20.0 years (10.0 ttl pk-yrs)    Types: Cigarettes    Start date: 01/1996    Quit date: 01/2016    Years since quitting: 6.7   Smokeless tobacco: Never  Vaping Use   Vaping status:  Never Used  Substance and Sexual Activity   Alcohol use: Not Currently    Comment: rarely   Drug use: No   Sexual activity: Not on file  Other Topics Concern   Not on file  Social History Narrative   Not on file   Social Determinants of Health   Financial Resource Strain: Low Risk  (05/07/2020)   Overall Financial  Resource Strain (CARDIA)    Difficulty of Paying Living Expenses: Not very hard  Food Insecurity: Unknown (11/16/2021)   Hunger Vital Sign    Worried About Running Out of Food in the Last Year: Not on file    Ran Out of Food in the Last Year: Never true  Transportation Needs: No Transportation Needs (11/16/2021)   PRAPARE - Administrator, Civil Service (Medical): No    Lack of Transportation (Non-Medical): No  Physical Activity: Not on file  Stress: Not on file  Social Connections: Not on file  Intimate Partner Violence: Not At Risk (11/16/2021)   Humiliation, Afraid, Rape, and Kick questionnaire    Fear of Current or Ex-Partner: No    Emotionally Abused: No    Physically Abused: No    Sexually Abused: No      Review of Systems  Constitutional:  Negative for chills, fatigue and unexpected weight change.  HENT:  Positive for postnasal drip. Negative for congestion, rhinorrhea, sneezing and sore throat.   Eyes:  Negative for redness.  Respiratory:  Negative for cough, chest tightness and shortness of breath.   Cardiovascular:  Negative for chest pain and palpitations.  Gastrointestinal:  Negative for abdominal pain, constipation, diarrhea, nausea and vomiting.  Genitourinary:  Negative for dysuria and frequency.  Musculoskeletal:  Negative for arthralgias, back pain, joint swelling and neck pain.  Skin:  Negative for rash.  Neurological: Negative.  Negative for tremors and numbness.  Hematological:  Negative for adenopathy. Does not bruise/bleed easily.  Psychiatric/Behavioral:  Positive for sleep disturbance. Negative for behavioral problems  (Depression) and suicidal ideas. The patient is not nervous/anxious.     Vital Signs: BP 120/70   Pulse 84   Temp 98.3 F (36.8 C)   Resp 16   Ht 5\' 4"  (1.626 m)   Wt 245 lb 3.2 oz (111.2 kg)   SpO2 98%   BMI 42.09 kg/m    Physical Exam Vitals and nursing note reviewed.  Constitutional:      General: She is not in acute distress.    Appearance: Normal appearance. She is obese. She is not ill-appearing.  HENT:     Head: Normocephalic and atraumatic.  Eyes:     Pupils: Pupils are equal, round, and reactive to light.  Cardiovascular:     Rate and Rhythm: Normal rate and regular rhythm.  Pulmonary:     Effort: Pulmonary effort is normal. No respiratory distress.  Abdominal:     Palpations: Abdomen is soft.  Musculoskeletal:        General: Normal range of motion.  Skin:    General: Skin is warm and dry.  Neurological:     Mental Status: She is alert and oriented to person, place, and time.  Psychiatric:        Mood and Affect: Mood normal.        Behavior: Behavior normal.        Assessment/Plan: 1. Type 2 diabetes mellitus with hyperglycemia, without long-term current use of insulin (HCC) Will try alternative GLP1 due to insurance changes. Pt will also check with insurance about preferred meds - Dulaglutide (TRULICITY) 0.75 MG/0.5ML SOPN; Inject 0.75 mg into the skin once a week.  Dispense: 2 mL; Refill: 2  2. Primary hypertension Stable, continue current medication  3. Other insomnia Stopped trazodone and states more so environmental and working on improving this. Will need sleep study in future once new insurance sorted out    General Counseling: Leslie [name] verbalizes  understanding of the findings of todays visit and agrees with plan of treatment. I have discussed any further diagnostic evaluation that may be needed or ordered today. We also reviewed her medications today. she has been encouraged to call the office with any questions or concerns that should  arise related to todays visit.    No orders of the defined types were placed in this encounter.   Meds ordered this encounter  Medications   Dulaglutide (TRULICITY) 0.75 MG/0.5ML SOPN    Sig: Inject 0.75 mg into the skin once a week.    Dispense:  2 mL    Refill:  2    This patient was seen by Lynn Ito, PA-C in collaboration with Dr. Beverely Risen as a part of collaborative care agreement.   Total time spent:30 Minutes Time spent includes review of chart, medications, test results, and follow up plan with the patient.      Dr Lyndon Code Internal medicine

## 2022-10-26 LAB — PARATHYROID HORMONE, INTACT (NO CA): PTH: 85 pg/mL — ABNORMAL HIGH (ref 15–65)

## 2022-10-31 ENCOUNTER — Other Ambulatory Visit: Payer: Self-pay | Admitting: Cardiovascular Disease

## 2022-11-04 ENCOUNTER — Ambulatory Visit
Admission: RE | Admit: 2022-11-04 | Discharge: 2022-11-04 | Disposition: A | Discharge status: Home / Self Care | Payer: Medicare Other | Source: Ambulatory Visit | Attending: Emergency Medicine | Admitting: Emergency Medicine

## 2022-11-04 VITALS — BP 131/70 | HR 56 | Temp 97.9°F | Resp 17

## 2022-11-04 DIAGNOSIS — M545 Low back pain, unspecified: Secondary | ICD-10-CM

## 2022-11-04 DIAGNOSIS — R109 Unspecified abdominal pain: Secondary | ICD-10-CM | POA: Diagnosis not present

## 2022-11-04 DIAGNOSIS — R350 Frequency of micturition: Secondary | ICD-10-CM | POA: Diagnosis not present

## 2022-11-04 LAB — POCT URINALYSIS DIP (MANUAL ENTRY)
Bilirubin, UA: NEGATIVE
Glucose, UA: NEGATIVE mg/dL
Ketones, POC UA: NEGATIVE mg/dL
Leukocytes, UA: NEGATIVE
Nitrite, UA: NEGATIVE
Protein Ur, POC: NEGATIVE mg/dL
Spec Grav, UA: 1.01 (ref 1.010–1.025)
Urobilinogen, UA: 0.2 U/dL
pH, UA: 6 (ref 5.0–8.0)

## 2022-11-04 MED ORDER — TAMSULOSIN HCL 0.4 MG PO CAPS: 0.4000 mg | ORAL_CAPSULE | Freq: Every day | ORAL | 0 refills | Status: DC

## 2022-11-04 MED ORDER — ONDANSETRON 4 MG PO TBDP: 4.0000 mg | ORAL_TABLET | Freq: Three times a day (TID) | ORAL | 0 refills | Status: DC | PRN

## 2022-11-04 MED ORDER — ACETAMINOPHEN-CODEINE 300-30 MG PO TABS: 1.0000 | ORAL_TABLET | Freq: Four times a day (QID) | ORAL | 0 refills | Status: DC | PRN

## 2022-11-04 NOTE — Discharge Instructions (Signed)
Today you are being treated prophlyactically for a kidney stone. In the urgent care setting, I do not have definitive imaging to determine if a kidney stone is present.  Today treatment is based off exam and the description of your symptoms.  On abdominal exam you do not have tenderness over the abdomen that would be concerning for an infectious or inflammatory organ, the sharp shooting pains that you are experiencing to the left side I do not believe it is related to your heart as you are currently not experiencing any heart symptoms such as chest pain or tightness any respiratory symptoms or any symptoms such as dizziness, lightheadedness or visual changes  Your urinalysis showed microscopic blood but showed no signs of infection  Take Tamsulosin on every day before bed for 14 days. This medication relaxes the urethra and will allow the stone to move through easier   You may use zofran every 8 hours as needed for nausea.  You may take Tylenol 3 every 6 hours as needed for pain.  This medicine has codeine which can make you feel drowsy, please be mindful of this if using throughout the day.  Please attempt use of over the counter Tylenol first.   Schedule follow-up appointment within the next 1 to 2 weeks for reevaluation of your symptoms with your primary doctor.  At any point if your pain becomes more severe, urine in blood begins or increases, you become lightheaded or dizzy please go to the nearest emergency department for further evaluation

## 2022-11-04 NOTE — ED Triage Notes (Signed)
Patient to Urgent Care with complaints of lower back pain that radiates into both sides. Describes a dull aching pain. States she has had some left sided, sharp flank pain. Some urinary frequency. Headaches.   Denies any injury or strenuous activity.   Symptoms started three days ago. Reports feeling like the pain is improving.

## 2022-11-04 NOTE — ED Provider Notes (Signed)
[name]    CSN: [id number] Arrival date & time: 11/04/22  1159      History   Chief Complaint Chief Complaint  Patient presents with   Back Pain    Having pains in my stomach, side, back and around my heart - Entered by patient    HPI Leslie [name] is a 57 y.o. female.   For evaluation of intermittent bilateral low back pain, intermittent left-sided flank pain radiating to the left upper quadrant of the abdomen, nausea without vomiting, urinary frequency present for 3 days.  Has been attempting to manage symptoms with Tylenol which has provided minimal relief.  Denies urgency, hematuria, dysuria, vaginal symptoms.  Pain to the left upper quadrant and flank is described as sharp and shooting concerned with involvement of the heart.  Denies chest pain or tightness, shortness of breath wheezing.  History of right sided kidney stone.  Past Medical History:  Diagnosis Date   (HFpEF) heart failure with preserved ejection fraction (HCC)    a. 08/2017 Echo: EF 55-60%.  Grade 2 diastolic dysfunction; b. 05/2020 Echo: EF 50-55%, no rwma, Nl RV fxn, nl fxn'ing mech MV.   Allergy    Anemia    Anxiety    Asthma    BRCA negative 03/22/2013   Bronchitis 02/19/2016   ON LEVAQUIN PO   Chest tightness    a. 08/2019 Cath: nl cors.   Cigarette smoker 09/11/2017   8-10 day   Dysrhythmia    GERD (gastroesophageal reflux disease)    History of kidney stones    Hyperlipidemia    Hypertension    Interstitial lung disease (HCC)    a. CT 2013 b. 02/2018 CXR noted recurrent intersistial changes ILD vs chronic bronchitis   Moderate mitral stenosis    a.  08/2017 TEE: EF 60 to 65%.  Moderate mitral stenosis.  Mean gradient 14 mmHg.  Valve area 2.59 cm by planimetry, 2.72 cm by pressure half-time.   PAF (paroxysmal atrial fibrillation) (HCC)    a. 08/2017 s/p TEE/DCCV; b. CHA2DS2VASc = 2-->warfarin.   Pneumonia    S/P Maze operation for atrial fibrillation 04/29/2020   Complete  bilateral atrial lesion set using bipolar radiofrequency and cryothermy ablation with clipping of LA appendage   S/P mitral valve replacement with Onyx bileaflet mechanical valve 04/29/2020   a. 04/2020 s/p 27/29 mm Onyx mech mitral valve-->chronic coumadin; b. 05/2020 Echo: Nl fxn'ing mech MV.    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 04/29/2020   S/P Maze operation for atrial fibrillation 04/29/2020   S/P MVR (mitral valve replacement) 04/29/2020   BMI 39.0-39.9,adult 09/30/2019   Total knee replacement status 04/09/2019   Acute nontraumatic kidney injury (HCC) 12/18/2018   Acute renal failure superimposed on stage 3b chronic kidney disease (HCC) 12/09/2018   Right knee pain 11/27/2018   GERD (gastroesophageal reflux disease) 10/25/2018   CKD (chronic kidney disease), stage III (HCC) 10/25/2018   CHF (congestive heart failure) (HCC) 10/25/2018   Normocytic anemia 10/01/2018   Iron deficiency anemia secondary to blood  loss (chronic)    Polyp of descending colon    Chronic heart failure with preserved ejection fraction (HFpEF) (HCC)    Renal calculus, right 07/03/2018   Low back pain 06/27/2018   Pedal edema 06/27/2018   Abnormal renal function 02/16/2018   Acute recurrent pansinusitis 01/13/2018   Anxiety 01/13/2018   Osteoarthritis of knee 01/09/2018   Morbid obesity with body mass index (BMI) of 40.0 or higher (HCC) 11/13/2017   Major depressive disorder 09/22/2017   Mitral valve stenosis    Paroxysmal atrial fibrillation  (HCC) 09/11/2017   Abdominal pain, generalized 09/08/2017   Hypertension 06/06/2017   Acute upper respiratory infection 04/28/2017   Pain of left lower extremity 02/11/2016   S/P total knee replacement using cement 12/25/2014   Fibrocystic breast disease 03/23/2013    Past Surgical History:  Procedure Laterality Date   ABDOMINAL HYSTERECTOMY     total   ABDOMINAL HYSTERECTOMY     BREAST BIOPSY Bilateral 2012   BREAST BIOPSY Right 08-07-12   fibroadenomatous changes and columnar cells   BREAST BIOPSY  02/03/2015   stereo byrnett   BUBBLE STUDY  04/01/2020   Procedure: BUBBLE STUDY;  Surgeon: Parke Poisson, MD;  Location: [facility] ENDOSCOPY;  Service: Cardiovascular;;   CARDIAC CATHETERIZATION     CHOLECYSTECTOMY N/A 02/27/2016   Procedure: LAPAROSCOPIC CHOLECYSTECTOMY;  Surgeon: Kieth Brightly, MD;  Location: [facility] ORS;  Service: General;  Laterality: N/A;   CLIPPING OF ATRIAL APPENDAGE  04/29/2020   Procedure: CLIPPING OF ATRIAL APPENDAGE USING ATRICURE  PRO2 CLIP SIZE ;  Surgeon: Purcell Nails, MD;  Location: [facility] OR;  Service: Open Heart Surgery;;   COLONOSCOPY WITH PROPOFOL N/A 09/26/2018   Procedure: COLONOSCOPY WITH PROPOFOL;  Surgeon: Midge Minium, MD;  Location: [facility] ENDOSCOPY;  Service: Endoscopy;  Laterality: N/A;   COLONOSCOPY WITH PROPOFOL N/A 07/16/2021   Procedure: COLONOSCOPY WITH PROPOFOL;  Surgeon: Midge Minium, MD;  Location: [facility] ENDOSCOPY;  Service: Endoscopy;  Laterality: N/A;   CYSTOSCOPY W/ RETROGRADES Right 08/18/2018   Procedure: CYSTOSCOPY WITH RETROGRADE PYELOGRAM;  Surgeon: Sondra Come, MD;  Location: [facility] ORS;  Service: Urology;  Laterality: Right;   CYSTOSCOPY/URETEROSCOPY/HOLMIUM LASER/STENT PLACEMENT Right 08/18/2018   Procedure: CYSTOSCOPY/URETEROSCOPY/STENT PLACEMENT;  Surgeon: Sondra Come, MD;  Location: [facility] ORS;  Service: Urology;  Laterality: Right;   DIAGNOSTIC LAPAROSCOPY     ESOPHAGOGASTRODUODENOSCOPY N/A 07/16/2021   Procedure:  ESOPHAGOGASTRODUODENOSCOPY (EGD);  Surgeon: Midge Minium, MD;  Location: [facility] ENDOSCOPY;  Service: Endoscopy;  Laterality: N/A;   ESOPHAGOGASTRODUODENOSCOPY (EGD) WITH PROPOFOL N/A 09/26/2018   Procedure: ESOPHAGOGASTRODUODENOSCOPY (EGD) WITH PROPOFOL;  Surgeon: Midge Minium, MD;  Location: [facility] ENDOSCOPY;  Service: Endoscopy;  Laterality: N/A;   GIVENS CAPSULE STUDY N/A 11/03/2018   Procedure: GIVENS CAPSULE STUDY;  Surgeon: Midge Minium, MD;  Location: [facility] ENDOSCOPY;  Service: Endoscopy;  Laterality: N/A;   JOINT REPLACEMENT Left    knee   KNEE ARTHROPLASTY Right 04/09/2019   Procedure: COMPUTER ASSISTED TOTAL KNEE ARTHROPLASTY;  Surgeon: Donato Heinz, MD;  Location: [facility] ORS;  Service: Orthopedics;  Laterality: Right;   KNEE CLOSED REDUCTION Left 04/15/2015   Procedure: CLOSED MANIPULATION KNEE;  Surgeon: Juanell Fairly, MD;  Location: [facility] ORS;  Service: Orthopedics;  Laterality: Left;   KNEE SURGERY     MAZE N/A 04/29/2020   Procedure: MAZE;  Surgeon: Purcell Nails, MD;  Location: [facility] OR;  Service: Open Heart Surgery;  Laterality: N/A;   MITRAL VALVE REPLACEMENT N/A 04/29/2020   Procedure: MITRAL VALVE (MV)  REPLACEMENT USING ON-X VALVE SIZE 27/29MM;  Surgeon: Purcell Nails, MD;  Location: [facility] OR;  Service: Open Heart Surgery;  Laterality: N/A;   MULTIPLE EXTRACTIONS WITH ALVEOLOPLASTY N/A 02/28/2020   Procedure: MULTIPLE EXTRACTION WITH ALVEOLOPLASTY;  Surgeon: Sharman Cheek, DMD;  Location: [facility] OR;  Service: Dentistry;  Laterality: N/A;   RIGHT/LEFT HEART CATH AND CORONARY ANGIOGRAPHY Bilateral 09/06/2019   Procedure: RIGHT/LEFT HEART CATH AND CORONARY ANGIOGRAPHY;  Surgeon: Antonieta Iba, MD;  Location: [facility] INVASIVE CV LAB;  Service: Cardiovascular;  Laterality: Bilateral;   TEE WITHOUT CARDIOVERSION N/A 09/13/2017   Procedure: TRANSESOPHAGEAL ECHOCARDIOGRAM (TEE);  Surgeon: Yvonne Kendall, MD;  Location: [facility] ORS;  Service: Cardiovascular;  Laterality: N/A;   TEE WITHOUT  CARDIOVERSION N/A 04/01/2020   Procedure: TRANSESOPHAGEAL ECHOCARDIOGRAM (TEE);  Surgeon: Parke Poisson, MD;  Location: [facility] ENDOSCOPY;  Service: Cardiovascular;  Laterality: N/A;   TEE WITHOUT CARDIOVERSION N/A 04/29/2020   Procedure: TRANSESOPHAGEAL ECHOCARDIOGRAM (TEE);  Surgeon: Purcell Nails, MD;  Location: [facility] OR;  Service: Open Heart Surgery;  Laterality: N/A;   TOTAL KNEE ARTHROPLASTY Left 12/25/2014   Procedure: TOTAL KNEE ARTHROPLASTY;  Surgeon: Juanell Fairly, MD;  Location: [facility] ORS;  Service: Orthopedics;  Laterality: Left;   TUBAL LIGATION      OB History     Gravida  3   Para  3   Term      Preterm      AB      Living  3      SAB      IAB      Ectopic      Multiple      Live Births           Obstetric Comments  1st Menstrual Cycle:  13 1st Pregnancy:  18          Home Medications    Prior to Admission medications   Medication Sig Start Date End Date Taking? Authorizing Provider  acetaminophen-codeine (TYLENOL #3) 300-30 MG tablet Take 1 tablet by mouth every 6 (six) hours as needed for moderate pain (pain score 4-6). 11/04/22  Yes Carmellia Kreisler R, NP  ondansetron (ZOFRAN-ODT) 4 MG disintegrating tablet Take 1 tablet (4 mg total) by mouth every 8 (eight) hours as needed for nausea or vomiting. 11/04/22  Yes Leiya Keesey R, NP  tamsulosin (FLOMAX) 0.4 MG CAPS capsule Take 1 capsule (0.4 mg total) by mouth daily. 11/04/22  Yes Cortnie Ringel, Elita Boone, NP  ALPRAZolam (XANAX) 0.5 MG tablet TAKE 1/2 TABLET BY MOUTH AT BEDTIME AS NEEDED FOR ANXIETY 09/16/22   McDonough, Lauren K, PA-C  amLODipine (NORVASC) 5 MG tablet Take 1 tablet (5 mg total) by mouth daily. 10/05/22   Antonieta Iba, MD  aspirin 81 MG EC tablet Take 81 mg by mouth daily.    [provider]  atorvastatin (LIPITOR) 80 MG tablet Take 1 tablet (80 mg total) by mouth daily. 10/05/22   Antonieta Iba, MD  calcium carbonate (TUMS - DOSED IN MG ELEMENTAL CALCIUM) 500 MG  chewable tablet Chew 500 mg by mouth daily as needed for indigestion or heartburn.    [provider]  chlorpheniramine-HYDROcodone (TUSSIONEX) 10-8 MG/5ML Take 5 mLs by mouth every 12 (twelve) hours as needed. Patient not taking: Reported on 11/04/2022 12/16/21   Sallyanne Kuster, NP  Dulaglutide (TRULICITY) 0.75 MG/0.5ML SOPN Inject 0.75 mg into the skin once a week. 10/25/22   McDonough, Salomon Fick, PA-C  Insulin Pen Needle 32G X 6 MM  MISC Use with victoza 05/04/22   McDonough, Salomon Fick, PA-C  linaclotide [facility]) 145 MCG CAPS capsule Take 1 capsule (145 mcg total) by mouth daily before breakfast. 09/23/22 03/22/23  [name], PA-C  losartan (COZAAR) 50 MG tablet Take 50 mg by mouth daily. 04/15/22 04/15/23  [provider]  metoprolol succinate (TOPROL-XL) 100 MG 24 hr tablet Take 1 tablet (100 mg total) by mouth in the morning and at bedtime. 11/01/22   Antonieta Iba, MD  nitrofurantoin, macrocrystal-monohydrate, (MACROBID) 100 MG capsule Take 1 capsule (100 mg total) by mouth 2 (two) times daily. Patient not taking: Reported on 11/04/2022 08/06/22   Carlean Jews, PA-C  ondansetron (ZOFRAN) 8 MG tablet Take 8 mg by mouth every 8 (eight) hours as needed. 06/17/22   [provider]  pantoprazole (PROTONIX) 40 MG tablet Take 1 tablet (40 mg total) by mouth daily. 10/05/22 01/03/23  Antonieta Iba, MD  potassium chloride SA (KLOR-CON M20) 20 MEQ tablet TAKE 2 TABLETS BY MOUTH TWICE A DAY 06/03/22   McDonough, Salomon Fick, PA-C  spironolactone (ALDACTONE) 25 MG tablet Take 1 tablet (25 mg total) by mouth daily. 10/05/22   Antonieta Iba, MD  torsemide (DEMADEX) 20 MG tablet Take 1 tablet (20 mg total) by mouth 2 (two) times daily. 10/05/22   Antonieta Iba, MD  traZODone (DESYREL) 50 MG tablet TAKE 1 TABLET BY MOUTH EVERYDAY AT BEDTIME 10/08/22   McDonough, Lauren K, PA-C  Vitamin D, Ergocalciferol, (DRISDOL) 1.25 MG (50000 UNIT) CAPS capsule TAKE 1 CAPSULE BY MOUTH  ONE TIME PER WEEK 07/19/22   McDonough, Lauren K, PA-C  warfarin (COUMADIN) 2 MG tablet TAKE 1/2 TO 1 TABLET BY MOUTH DAILY AS DIRECTED BY THE [facility] Patient taking differently: Take 1-2 mg by mouth See admin instructions. Take 2 mg every day every day except on Sunday Thursday take 1 mg per patient 11/02/21   End, Cristal Deer, MD    Family History Family History  Problem Relation Age of Onset   Breast cancer Mother    Cancer Mother 31       breast   Hypertension Mother    Osteoarthritis Father    Hypertension Father    Cancer Maternal Aunt        breast   Breast cancer Maternal Grandmother    Cancer Maternal Grandmother        breast    Social History Social History   Tobacco Use   Smoking status: Former    Current packs/day: 0.00    Average packs/day: 0.5 packs/day for 20.0 years (10.0 ttl pk-yrs)    Types: Cigarettes    Start date: 01/1996    Quit date: 01/2016    Years since quitting: 6.7   Smokeless tobacco: Never  Vaping Use   Vaping status: Never Used  Substance Use Topics   Alcohol use: Not Currently    Comment: rarely   Drug use: No     Allergies   Morphine, Oxycodone hcl, and Dilaudid [hydromorphone hcl]   Review of Systems Review of Systems   Physical Exam Triage Vital Signs ED Triage Vitals  Encounter Vitals Group     BP 11/04/22 1208 131/70     Systolic BP Percentile --      Diastolic BP Percentile --      Pulse Rate 11/04/22 1208 (!) 56     Resp 11/04/22 1208 17     Temp 11/04/22 1208 97.9 F (36.6 C)  Temp src --      SpO2 11/04/22 1208 98 %     Weight --      Height --      Head Circumference --      Peak Flow --      Pain Score 11/04/22 1203 6     Pain Loc --      Pain Education --      Exclude from Growth Chart --    No data found.  Updated Vital Signs BP 131/70   Pulse (!) 56   Temp 97.9 F (36.6 C)   Resp 17   SpO2 98%   Visual Acuity Right Eye Distance:   Left Eye Distance:   Bilateral Distance:     Right Eye Near:   Left Eye Near:    Bilateral Near:     Physical Exam Constitutional:      Appearance: Normal appearance.  Eyes:     Extraocular Movements: Extraocular movements intact.  Pulmonary:     Effort: Pulmonary effort is normal.  Abdominal:     General: Abdomen is flat. Bowel sounds are normal.     Palpations: Abdomen is soft.     Tenderness: There is abdominal tenderness in the epigastric area. There is no right CVA tenderness, left CVA tenderness or guarding.  Musculoskeletal:     Comments: Mild generalized tenderness to the lumbar region of the back without point tenderness or spinal tenderness, no ecchymosis swelling or deformity, able to sit erect without complication, negative straight leg test, able to twist turn and gain  Neurological:     Mental Status: She is alert and oriented to person, place, and time. Mental status is at baseline.      UC Treatments / Results  Labs (all labs ordered are listed, but only abnormal results are displayed) Labs Reviewed  POCT URINALYSIS DIP (MANUAL ENTRY) - Abnormal; Notable for the following components:      Result Value   Color, UA light yellow (*)    Blood, UA trace-intact (*)    All other components within normal limits    EKG   Radiology No results found.  Procedures Procedures (including critical care time)  Medications Ordered in UC Medications - No data to display  Initial Impression / Assessment and Plan / UC Course  I have reviewed the triage vital signs and the nursing notes.  Pertinent labs & imaging results that were available during my care of the patient were reviewed by me and considered in my medical decision making (see chart for details).  Flank pain, acute bilateral low back pain without sciatica, urinary frequency  Vital signs stable, patient is in no signs of distress nontoxic in, mild epigastric tenderness noted on exam, no CVA tenderness, generalized tenderness to the lumbar region,  stable for outpatient management, low suspicion for acute inflammatory process of the organ or infectious process, discussed with patient, urinalysis is negative for infection, microscopic blood noted, discussed findings, based on description of symptoms and exam findings most likely cause is kidney stone, known history, will provide prophylactic treatment, and to complete imaging, prescribed Flomax, Tylenol 3 and Zofran, PDMP reviewed, low risk, discussed additional supportive measures, advised follow-up with PCP in 1 to 2 weeks and given strict ED precautions, may follow-up with urgent care as needed Final Clinical Impressions(s) / UC Diagnoses   Final diagnoses:  Left flank pain  Acute bilateral low back pain without sciatica  Urinary frequency     Discharge Instructions  Today you are being treated prophlyactically for a kidney stone. In the urgent care setting, I do not have definitive imaging to determine if a kidney stone is present.  Today treatment is based off exam and the description of your symptoms.  On abdominal exam you do not have tenderness over the abdomen that would be concerning for an infectious or inflammatory organ, the sharp shooting pains that you are experiencing to the left side I do not believe it is related to your heart as you are currently not experiencing any heart symptoms such as chest pain or tightness any respiratory symptoms or any symptoms such as dizziness, lightheadedness or visual changes  Your urinalysis showed microscopic blood but showed no signs of infection  Take Tamsulosin on every day before bed for 14 days. This medication relaxes the urethra and will allow the stone to move through easier   You may use zofran every 8 hours as needed for nausea.  You may take Tylenol 3 every 6 hours as needed for pain.  This medicine has codeine which can make you feel drowsy, please be mindful of this if using throughout the day.  Please attempt use of over  the counter Tylenol first.   Schedule follow-up appointment within the next 1 to 2 weeks for reevaluation of your symptoms with your primary doctor.  At any point if your pain becomes more severe, urine in blood begins or increases, you become lightheaded or dizzy please go to the nearest emergency department for further evaluation     ED Prescriptions     Medication Sig Dispense Auth. Provider   tamsulosin (FLOMAX) 0.4 MG CAPS capsule Take 1 capsule (0.4 mg total) by mouth daily. 14 capsule Dmario Russom R, NP   acetaminophen-codeine (TYLENOL #3) 300-30 MG tablet Take 1 tablet by mouth every 6 (six) hours as needed for moderate pain (pain score 4-6). 12 tablet Jamye Balicki R, NP   ondansetron (ZOFRAN-ODT) 4 MG disintegrating tablet Take 1 tablet (4 mg total) by mouth every 8 (eight) hours as needed for nausea or vomiting. 20 tablet Analena Gama, Elita Boone, NP      I have reviewed the PDMP during this encounter.   Valinda Hoar, NP 11/04/22 1256

## 2022-11-08 NOTE — Plan of Care (Signed)
[facility] Tonsillectomy/Adenoidectomy, Postoperative PEDS care plan entered in error.

## 2022-11-09 ENCOUNTER — Encounter: Payer: Self-pay | Admitting: Internal Medicine

## 2022-11-10 ENCOUNTER — Ambulatory Visit: Payer: Medicare Other | Attending: Cardiovascular Disease

## 2022-11-10 DIAGNOSIS — I4891 Unspecified atrial fibrillation: Secondary | ICD-10-CM | POA: Diagnosis present

## 2022-11-10 DIAGNOSIS — Z5181 Encounter for therapeutic drug level monitoring: Secondary | ICD-10-CM

## 2022-11-10 DIAGNOSIS — I050 Rheumatic mitral stenosis: Secondary | ICD-10-CM | POA: Insufficient documentation

## 2022-11-10 LAB — POCT INR: INR: 3.4 — AB (ref 2.0–3.0)

## 2022-11-10 NOTE — Patient Instructions (Signed)
continue 1 tablet daily except 1/2 tablet on Mondays and Fridays. Stay consistent with greens.  - Recheck INR in 6 weeks [phone]

## 2022-11-15 ENCOUNTER — Other Ambulatory Visit: Payer: Self-pay | Admitting: Physician Assistant

## 2022-11-15 ENCOUNTER — Telehealth: Payer: Self-pay

## 2022-11-15 ENCOUNTER — Encounter: Payer: Self-pay | Admitting: Internal Medicine

## 2022-11-15 ENCOUNTER — Ambulatory Visit: Payer: Medicare Other

## 2022-11-15 NOTE — Telephone Encounter (Signed)
Pt advised and that we sent both med

## 2022-11-18 ENCOUNTER — Ambulatory Visit: Payer: Medicare Other

## 2022-11-19 ENCOUNTER — Encounter: Payer: Self-pay | Admitting: Physician Assistant

## 2022-11-19 ENCOUNTER — Ambulatory Visit (INDEPENDENT_AMBULATORY_CARE_PROVIDER_SITE_OTHER): Payer: Medicare Other | Admitting: Physician Assistant

## 2022-11-19 ENCOUNTER — Telehealth: Payer: Self-pay | Admitting: Physician Assistant

## 2022-11-19 VITALS — BP 112/68 | HR 70 | Temp 98.2°F | Resp 16 | Ht 64.0 in | Wt 247.8 lb

## 2022-11-19 DIAGNOSIS — Z0000 Encounter for general adult medical examination without abnormal findings: Secondary | ICD-10-CM

## 2022-11-19 DIAGNOSIS — N390 Urinary tract infection, site not specified: Secondary | ICD-10-CM

## 2022-11-19 DIAGNOSIS — G4710 Hypersomnia, unspecified: Secondary | ICD-10-CM

## 2022-11-19 DIAGNOSIS — R319 Hematuria, unspecified: Secondary | ICD-10-CM

## 2022-11-19 NOTE — Telephone Encounter (Signed)
Awaiting 11/19/22 office notes for SS order-Toni

## 2022-11-19 NOTE — Progress Notes (Signed)
[facility] [address] [place], [place] [address]  Internal MEDICINE  Office Visit Note  Patient Name: Leslie [name]  [id number]  [id number]  Date of Service: 11/29/2022  Chief Complaint  Patient presents with   Medicare Wellness   Gastroesophageal Reflux   Hypertension   Hyperlipidemia    HPI Leslie [name] presents for an annual well visit and physical exam. Patient also has several concerns today. Her husband is with her in office. Well-appearing 57 y.o. female Routine CRC screening: Done in 2023 Routine mammogram: Done in July Eye exam and/or foot exam: UTD on eye exams New or worsening pain: still having abdominal pain along both flanks and along abdomen. Will recheck urine today. Patient has had CT scans previously and has seen GI, nephrology and urology regarding this without much resolution. Not on GLP1 as possible cause now either due to insurance not covering. Did discuss that GI had given her alternative PPI for possible GERD as contributor and pt never tried new med, but may try this and follow up with GI after. Other concerns: declines shingles vaccine -pt has snoring, gasping at night, restless sleep, daytime sleepiness and needs sleep study and requests Home study EPWORTH SLEEPINESS SCALE:  Scale:  (0)= no chance of dozing; (1)= slight chance of dozing; (2)= moderate chance of dozing; (3)= high chance of dozing  Chance  Situtation    Sitting and reading: 3    Watching TV: 3    Sitting Inactive in public: 0    As a passenger in car: 0      Lying down to rest: 3    Sitting and talking: 2    Sitting quielty after lunch: 3    In a car, stopped in traffic: 0   TOTAL SCORE:   14 out of 24       11/19/2022    9:06 AM 09/22/2018    2:06 PM  MMSE - Mini Mental State Exam  Orientation to time 5 5  Orientation to Place 5 5  Registration 3 3  Attention/ Calculation 5 5  Recall 3 3  Language- name 2 objects 2 2  Language- repeat 1 1   Language- follow 3 step command 3 3  Language- read & follow direction 1 1  Write a sentence 1 1  Copy design 1 1  Total score 30 30    Functional Status Survey: Is the patient deaf or have difficulty hearing?: No Does the patient have difficulty seeing, even when wearing glasses/contacts?: No Does the patient have difficulty concentrating, remembering, or making decisions?: No Does the patient have difficulty walking or climbing stairs?: No Does the patient have difficulty dressing or bathing?: No Does the patient have difficulty doing errands alone such as visiting a doctor's office or shopping?: No     01/12/2022   11:11 AM 02/08/2022    9:23 AM 06/24/2022    3:09 PM 10/25/2022    3:01 PM 11/19/2022    9:06 AM  Fall Risk  Falls in the past year?  1 0 0 1  Was there an injury with Fall?  0  0 0  Fall Risk Category Calculator  1  0 1  (RETIRED) Patient Fall Risk Level Low fall risk      Patient at Risk for Falls Due to   No Fall Risks    Fall risk Follow up   Falls evaluation completed         11/19/2022    9:06 AM  Depression screen PHQ 2/9  Decreased Interest 0  Down, Depressed, Hopeless 0  PHQ - 2 Score 0        No data to display            Current Medication: Outpatient Encounter Medications as of 11/19/2022  Medication Sig   acetaminophen-codeine (TYLENOL #3) 300-30 MG tablet Take 1 tablet by mouth every 6 (six) hours as needed for moderate pain (pain score 4-6).   ALPRAZolam (XANAX) 0.5 MG tablet TAKE 1/2 TABLET BY MOUTH AT BEDTIME AS NEEDED FOR ANXIETY   amLODipine (NORVASC) 5 MG tablet Take 1 tablet (5 mg total) by mouth daily.   aspirin 81 MG EC tablet Take 81 mg by mouth daily.   atorvastatin (LIPITOR) 80 MG tablet Take 1 tablet (80 mg total) by mouth daily.   calcium carbonate (TUMS - DOSED IN MG ELEMENTAL CALCIUM) 500 MG chewable tablet Chew 500 mg by mouth daily as needed for indigestion or heartburn.   chlorpheniramine-HYDROcodone (TUSSIONEX) 10-8  MG/5ML Take 5 mLs by mouth every 12 (twelve) hours as needed.   Dulaglutide (TRULICITY) 0.75 MG/0.5ML SOPN Inject 0.75 mg into the skin once a week.   Insulin Pen Needle 32G X 6 MM MISC Use with victoza   linaclotide (LINZESS) 145 MCG CAPS capsule Take 1 capsule (145 mcg total) by mouth daily before breakfast.   losartan (COZAAR) 50 MG tablet Take 50 mg by mouth daily.   metoprolol succinate (TOPROL-XL) 100 MG 24 hr tablet Take 1 tablet (100 mg total) by mouth in the morning and at bedtime.   nitrofurantoin, macrocrystal-monohydrate, (MACROBID) 100 MG capsule Take 1 capsule (100 mg total) by mouth 2 (two) times daily.   omeprazole (PRILOSEC) 40 MG capsule TAKE 1 CAPSULE (40 MG TOTAL) BY MOUTH DAILY.   ondansetron (ZOFRAN) 8 MG tablet Take 8 mg by mouth every 8 (eight) hours as needed.   ondansetron (ZOFRAN-ODT) 4 MG disintegrating tablet Take 1 tablet (4 mg total) by mouth every 8 (eight) hours as needed for nausea or vomiting.   potassium chloride SA (KLOR-CON M20) 20 MEQ tablet TAKE 2 TABLETS BY MOUTH TWICE A DAY   spironolactone (ALDACTONE) 25 MG tablet Take 1 tablet (25 mg total) by mouth daily.   tamsulosin (FLOMAX) 0.4 MG CAPS capsule Take 1 capsule (0.4 mg total) by mouth daily.   torsemide (DEMADEX) 20 MG tablet Take 1 tablet (20 mg total) by mouth 2 (two) times daily.   traZODone (DESYREL) 50 MG tablet TAKE 1 TABLET BY MOUTH EVERYDAY AT BEDTIME   Vitamin D, Ergocalciferol, (DRISDOL) 1.25 MG (50000 UNIT) CAPS capsule TAKE 1 CAPSULE BY MOUTH ONE TIME PER WEEK   warfarin (COUMADIN) 2 MG tablet TAKE 1/2 TO 1 TABLET BY MOUTH DAILY AS DIRECTED BY THE ANTI-COAG CLINIC (Patient taking differently: Take 1-2 mg by mouth See admin instructions. Take 2 mg every day every day except on Sunday Thursday take 1 mg per patient)   No facility-administered encounter medications on file as of 11/19/2022.    Surgical History: Past Surgical History:  Procedure Laterality Date   ABDOMINAL HYSTERECTOMY      total   ABDOMINAL HYSTERECTOMY     BREAST BIOPSY Bilateral 2012   BREAST BIOPSY Right 08-07-12   fibroadenomatous changes and columnar cells   BREAST BIOPSY  02/03/2015   stereo byrnett   BUBBLE STUDY  04/01/2020   Procedure: BUBBLE STUDY;  Surgeon: Parke Poisson, MD;  Location: [facility] ENDOSCOPY;  Service: Cardiovascular;;   CARDIAC CATHETERIZATION  CHOLECYSTECTOMY N/A 02/27/2016   Procedure: LAPAROSCOPIC CHOLECYSTECTOMY;  Surgeon: Kieth Brightly, MD;  Location: [facility] ORS;  Service: General;  Laterality: N/A;   CLIPPING OF ATRIAL APPENDAGE  04/29/2020   Procedure: CLIPPING OF ATRIAL APPENDAGE USING ATRICURE  PRO2 CLIP SIZE ;  Surgeon: Purcell Nails, MD;  Location: [facility] OR;  Service: Open Heart Surgery;;   COLONOSCOPY WITH PROPOFOL N/A 09/26/2018   Procedure: COLONOSCOPY WITH PROPOFOL;  Surgeon: Midge Minium, MD;  Location: [facility] ENDOSCOPY;  Service: Endoscopy;  Laterality: N/A;   COLONOSCOPY WITH PROPOFOL N/A 07/16/2021   Procedure: COLONOSCOPY WITH PROPOFOL;  Surgeon: Midge Minium, MD;  Location: [facility] ENDOSCOPY;  Service: Endoscopy;  Laterality: N/A;   CYSTOSCOPY W/ RETROGRADES Right 08/18/2018   Procedure: CYSTOSCOPY WITH RETROGRADE PYELOGRAM;  Surgeon: Sondra Come, MD;  Location: [facility] ORS;  Service: Urology;  Laterality: Right;   CYSTOSCOPY/URETEROSCOPY/HOLMIUM LASER/STENT PLACEMENT Right 08/18/2018   Procedure: CYSTOSCOPY/URETEROSCOPY/STENT PLACEMENT;  Surgeon: Sondra Come, MD;  Location: [facility] ORS;  Service: Urology;  Laterality: Right;   DIAGNOSTIC LAPAROSCOPY     ESOPHAGOGASTRODUODENOSCOPY N/A 07/16/2021   Procedure: ESOPHAGOGASTRODUODENOSCOPY (EGD);  Surgeon: Midge Minium, MD;  Location: [facility] ENDOSCOPY;  Service: Endoscopy;  Laterality: N/A;   ESOPHAGOGASTRODUODENOSCOPY (EGD) WITH PROPOFOL N/A 09/26/2018   Procedure: ESOPHAGOGASTRODUODENOSCOPY (EGD) WITH PROPOFOL;  Surgeon: Midge Minium, MD;  Location: [facility] ENDOSCOPY;  Service: Endoscopy;  Laterality: N/A;   GIVENS CAPSULE  STUDY N/A 11/03/2018   Procedure: GIVENS CAPSULE STUDY;  Surgeon: Midge Minium, MD;  Location: [facility] ENDOSCOPY;  Service: Endoscopy;  Laterality: N/A;   JOINT REPLACEMENT Left    knee   KNEE ARTHROPLASTY Right 04/09/2019   Procedure: COMPUTER ASSISTED TOTAL KNEE ARTHROPLASTY;  Surgeon: Donato Heinz, MD;  Location: [facility] ORS;  Service: Orthopedics;  Laterality: Right;   KNEE CLOSED REDUCTION Left 04/15/2015   Procedure: CLOSED MANIPULATION KNEE;  Surgeon: Juanell Fairly, MD;  Location: [facility] ORS;  Service: Orthopedics;  Laterality: Left;   KNEE SURGERY     MAZE N/A 04/29/2020   Procedure: MAZE;  Surgeon: Purcell Nails, MD;  Location: [facility] OR;  Service: Open Heart Surgery;  Laterality: N/A;   MITRAL VALVE REPLACEMENT N/A 04/29/2020   Procedure: MITRAL VALVE (MV) REPLACEMENT USING ON-X VALVE SIZE 27/29MM;  Surgeon: Purcell Nails, MD;  Location: [facility] OR;  Service: Open Heart Surgery;  Laterality: N/A;   MULTIPLE EXTRACTIONS WITH ALVEOLOPLASTY N/A 02/28/2020   Procedure: MULTIPLE EXTRACTION WITH ALVEOLOPLASTY;  Surgeon: Sharman Cheek, DMD;  Location: [facility] OR;  Service: Dentistry;  Laterality: N/A;   RIGHT/LEFT HEART CATH AND CORONARY ANGIOGRAPHY Bilateral 09/06/2019   Procedure: RIGHT/LEFT HEART CATH AND CORONARY ANGIOGRAPHY;  Surgeon: Antonieta Iba, MD;  Location: [facility] INVASIVE CV LAB;  Service: Cardiovascular;  Laterality: Bilateral;   TEE WITHOUT CARDIOVERSION N/A 09/13/2017   Procedure: TRANSESOPHAGEAL ECHOCARDIOGRAM (TEE);  Surgeon: Yvonne Kendall, MD;  Location: [facility] ORS;  Service: Cardiovascular;  Laterality: N/A;   TEE WITHOUT CARDIOVERSION N/A 04/01/2020   Procedure: TRANSESOPHAGEAL ECHOCARDIOGRAM (TEE);  Surgeon: Parke Poisson, MD;  Location: [facility] ENDOSCOPY;  Service: Cardiovascular;  Laterality: N/A;   TEE WITHOUT CARDIOVERSION N/A 04/29/2020   Procedure: TRANSESOPHAGEAL ECHOCARDIOGRAM (TEE);  Surgeon: Purcell Nails, MD;  Location: [facility] OR;  Service: Open Heart Surgery;  Laterality:  N/A;   TOTAL KNEE ARTHROPLASTY Left 12/25/2014   Procedure: TOTAL KNEE ARTHROPLASTY;  Surgeon: Juanell Fairly, MD;  Location: [facility] ORS;  Service: Orthopedics;  Laterality: Left;   TUBAL LIGATION      Medical History: Past Medical  History:  Diagnosis Date   (HFpEF) heart failure with preserved ejection fraction (HCC)    a. 08/2017 Echo: EF 55-60%.  Grade 2 diastolic dysfunction; b. 05/2020 Echo: EF 50-55%, no rwma, Nl RV fxn, nl fxn'ing mech MV.   Allergy    Anemia    Anxiety    Asthma    BRCA negative 03/22/2013   Bronchitis 02/19/2016   ON LEVAQUIN PO   Chest tightness    a. 08/2019 Cath: nl cors.   Cigarette smoker 09/11/2017   8-10 day   Dysrhythmia    GERD (gastroesophageal reflux disease)    History of kidney stones    Hyperlipidemia    Hypertension    Interstitial lung disease (HCC)    a. CT 2013 b. 02/2018 CXR noted recurrent intersistial changes ILD vs chronic bronchitis   Moderate mitral stenosis    a.  08/2017 TEE: EF 60 to 65%.  Moderate mitral stenosis.  Mean gradient 14 mmHg.  Valve area 2.59 cm by planimetry, 2.72 cm by pressure half-time.   PAF (paroxysmal atrial fibrillation) (HCC)    a. 08/2017 s/p TEE/DCCV; b. CHA2DS2VASc = 2-->warfarin.   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 04/2020 s/p 27/29 mm Onyx mech mitral valve-->chronic coumadin; b. 05/2020 Echo: Nl fxn'ing mech MV.    Family History: Family History  Problem Relation Age of Onset   Breast cancer Mother    Cancer Mother 71       breast   Hypertension Mother    Osteoarthritis Father    Hypertension Father    Cancer Maternal Aunt        breast   Breast cancer Maternal Grandmother    Cancer Maternal Grandmother        breast    Social History   Socioeconomic  History   Marital status: Married    Spouse name: Not on file   Number of children: Not on file   Years of education: Not on file   Highest education level: Not on file  Occupational History   Not on file  Tobacco Use   Smoking status: Former    Current packs/day: 0.00    Average packs/day: 0.5 packs/day for 20.0 years (10.0 ttl pk-yrs)    Types: Cigarettes    Start date: 01/1996    Quit date: 01/2016    Years since quitting: 6.8   Smokeless tobacco: Never  Vaping Use   Vaping status: Never Used  Substance and Sexual Activity   Alcohol use: Not Currently    Comment: rarely   Drug use: No   Sexual activity: Not on file  Other Topics Concern   Not on file  Social History Narrative   Not on file   Social Determinants of Health   Financial Resource Strain: Low Risk  (05/07/2020)   Overall Financial Resource Strain (CARDIA)    Difficulty of Paying Living Expenses: Not very hard  Food Insecurity: Unknown (11/16/2021)   Hunger Vital Sign    Worried About Running Out of Food in the Last Year: Not on file    Ran Out of Food in the Last Year: Never true  Transportation Needs: No Transportation Needs (11/16/2021)   PRAPARE - Transportation    Lack of Transportation (Medical): No    Lack of  Transportation (Non-Medical): No  Physical Activity: Not on file  Stress: Not on file  Social Connections: Not on file  Intimate Partner Violence: Not At Risk (11/16/2021)   Humiliation, Afraid, Rape, and Kick questionnaire    Fear of Current or Ex-Partner: No    Emotionally Abused: No    Physically Abused: No    Sexually Abused: No      Review of Systems  Constitutional:  Negative for chills, fatigue and unexpected weight change.  HENT:  Negative for congestion, rhinorrhea, sneezing and sore throat.   Eyes:  Negative for redness.  Respiratory:  Negative for cough, chest tightness and shortness of breath.   Cardiovascular:  Negative for chest pain and palpitations.   Gastrointestinal:  Positive for abdominal pain. Negative for constipation, diarrhea, nausea and vomiting.  Genitourinary:  Positive for flank pain. Negative for dysuria and frequency.  Musculoskeletal:  Positive for back pain. Negative for arthralgias, joint swelling and neck pain.  Skin:  Negative for rash.  Neurological: Negative.  Negative for tremors and numbness.  Hematological:  Negative for adenopathy. Does not bruise/bleed easily.  Psychiatric/Behavioral:  Positive for sleep disturbance. Negative for behavioral problems (Depression) and suicidal ideas.     Vital Signs: BP 112/68   Pulse 70   Temp 98.2 F (36.8 C)   Resp 16   Ht 5\' 4"  (1.626 m)   Wt 247 lb 12.8 oz (112.4 kg)   SpO2 98%   BMI 42.53 kg/m    Physical Exam Vitals and nursing note reviewed.  Constitutional:      General: She is not in acute distress.    Appearance: Normal appearance. She is obese. She is not ill-appearing.  HENT:     Head: Normocephalic and atraumatic.  Eyes:     Pupils: Pupils are equal, round, and reactive to light.  Cardiovascular:     Rate and Rhythm: Normal rate and regular rhythm.  Pulmonary:     Effort: Pulmonary effort is normal. No respiratory distress.  Abdominal:     Palpations: Abdomen is soft.     Tenderness: There is abdominal tenderness.     Comments: Mild tenderness throughout abdomen and around to flanks  Musculoskeletal:        General: Normal range of motion.  Skin:    General: Skin is warm and dry.  Neurological:     Mental Status: She is alert and oriented to person, place, and time.  Psychiatric:        Mood and Affect: Mood normal.        Behavior: Behavior normal.        Assessment/Plan: 1. Initial Medicare annual wellness visit AWV performed, UTD on PHM-declines shingles vaccine  2. Hypersomnia Due to snoring, gasping, daytime sleepiness and elevated BMI will order HST - Home sleep test  3. Urinary tract infection with hematuria, site  unspecified - CULTURE, URINE COMPREHENSIVE  4. Morbid obesity with body mass index (BMI) of 40.0 or higher (HCC) Will order HST    General Counseling: Perle verbalizes understanding of the findings of todays visit and agrees with plan of treatment. I have discussed any further diagnostic evaluation that may be needed or ordered today. We also reviewed her medications today. she has been encouraged to call the office with any questions or concerns that should arise related to todays visit.    Orders Placed This Encounter  Procedures   CULTURE, URINE COMPREHENSIVE   Home sleep test    No orders of the defined types  were placed in this encounter.   Return for after test is done.   Total time spent:45 Minutes Time spent includes review of chart, medications, test results, and follow up plan with the patient.   [place] Controlled Substance Database was reviewed by me.  This patient was seen by Lynn Ito, PA-C in collaboration with Dr. Beverely Risen as a part of collaborative care agreement.  Lynn Ito, PA-C Internal medicine

## 2022-11-22 ENCOUNTER — Emergency Department
Admission: EM | Admit: 2022-11-22 | Discharge: 2022-11-22 | Disposition: A | Discharge status: Home / Self Care | Payer: Medicare Other | Attending: Emergency Medicine | Admitting: Emergency Medicine

## 2022-11-22 ENCOUNTER — Emergency Department: Payer: Medicare Other

## 2022-11-22 ENCOUNTER — Other Ambulatory Visit: Payer: Self-pay

## 2022-11-22 DIAGNOSIS — R109 Unspecified abdominal pain: Secondary | ICD-10-CM

## 2022-11-22 DIAGNOSIS — Z20822 Contact with and (suspected) exposure to covid-19: Secondary | ICD-10-CM | POA: Diagnosis not present

## 2022-11-22 DIAGNOSIS — K5900 Constipation, unspecified: Secondary | ICD-10-CM | POA: Insufficient documentation

## 2022-11-22 DIAGNOSIS — N184 Chronic kidney disease, stage 4 (severe): Secondary | ICD-10-CM | POA: Insufficient documentation

## 2022-11-22 DIAGNOSIS — I129 Hypertensive chronic kidney disease with stage 1 through stage 4 chronic kidney disease, or unspecified chronic kidney disease: Secondary | ICD-10-CM | POA: Diagnosis not present

## 2022-11-22 DIAGNOSIS — R0602 Shortness of breath: Secondary | ICD-10-CM | POA: Insufficient documentation

## 2022-11-22 DIAGNOSIS — J449 Chronic obstructive pulmonary disease, unspecified: Secondary | ICD-10-CM | POA: Diagnosis not present

## 2022-11-22 DIAGNOSIS — R079 Chest pain, unspecified: Secondary | ICD-10-CM | POA: Diagnosis present

## 2022-11-22 DIAGNOSIS — R110 Nausea: Secondary | ICD-10-CM | POA: Diagnosis not present

## 2022-11-22 LAB — HEPATIC FUNCTION PANEL
ALT: 25 U/L (ref 0–44)
AST: 24 U/L (ref 15–41)
Albumin: 4 g/dL (ref 3.5–5.0)
Alkaline Phosphatase: 115 U/L (ref 38–126)
Bilirubin, Direct: <0.1 mg/dL (ref 0.0–0.2)
Total Bilirubin: 0.3 mg/dL (ref <1.2)
Total Protein: 7.8 g/dL (ref 6.5–8.1)

## 2022-11-22 LAB — URINALYSIS, ROUTINE W REFLEX MICROSCOPIC
Bilirubin Urine: NEGATIVE
Glucose, UA: NEGATIVE mg/dL
Hgb urine dipstick: NEGATIVE
Ketones, ur: NEGATIVE mg/dL
Leukocytes,Ua: NEGATIVE
Nitrite: NEGATIVE
Protein, ur: NEGATIVE mg/dL
Specific Gravity, Urine: 1.009 (ref 1.005–1.030)
pH: 6 (ref 5.0–8.0)

## 2022-11-22 LAB — RESP PANEL BY RT-PCR (RSV, FLU A&B, COVID)  RVPGX2
Influenza A by PCR: NEGATIVE
Influenza B by PCR: NEGATIVE
Resp Syncytial Virus by PCR: NEGATIVE
SARS Coronavirus 2 by RT PCR: NEGATIVE

## 2022-11-22 LAB — CBC
HCT: 32.5 % — ABNORMAL LOW (ref 36.0–46.0)
Hemoglobin: 10.4 g/dL — ABNORMAL LOW (ref 12.0–15.0)
MCH: 29.6 pg (ref 26.0–34.0)
MCHC: 32 g/dL (ref 30.0–36.0)
MCV: 92.6 fL (ref 80.0–100.0)
Platelets: 331 10*3/uL (ref 150–400)
RBC: 3.51 MIL/uL — ABNORMAL LOW (ref 3.87–5.11)
RDW: 12.6 % (ref 11.5–15.5)
WBC: 6.8 10*3/uL (ref 4.0–10.5)
nRBC: 0 % (ref 0.0–0.2)

## 2022-11-22 LAB — LIPASE, BLOOD: Lipase: 29 U/L (ref 11–51)

## 2022-11-22 LAB — BASIC METABOLIC PANEL
Anion gap: 9 (ref 5–15)
BUN: 44 mg/dL — ABNORMAL HIGH (ref 6–20)
CO2: 28 mmol/L (ref 22–32)
Calcium: 9 mg/dL (ref 8.9–10.3)
Chloride: 98 mmol/L (ref 98–111)
Creatinine, Ser: 2.28 mg/dL — ABNORMAL HIGH (ref 0.44–1.00)
GFR, Estimated: 25 mL/min — ABNORMAL LOW (ref >60)
Glucose, Bld: 156 mg/dL — ABNORMAL HIGH (ref 70–99)
Potassium: 3.9 mmol/L (ref 3.5–5.1)
Sodium: 135 mmol/L (ref 135–145)

## 2022-11-22 LAB — PROTIME-INR
INR: 2.7 — ABNORMAL HIGH (ref 0.8–1.2)
Prothrombin Time: 28.6 s — ABNORMAL HIGH (ref 11.4–15.2)

## 2022-11-22 LAB — TROPONIN I (HIGH SENSITIVITY)
Troponin I (High Sensitivity): 7 ng/L (ref <18)
Troponin I (High Sensitivity): 6 ng/L (ref <18)

## 2022-11-22 NOTE — ED Triage Notes (Addendum)
Pt comes with c/o left side cp that radiates down arm. Pt stats this started about 3 days ago. Pt has extensive heart hx. Pt states some sob. Pt is on thinner.

## 2022-11-22 NOTE — ED Provider Notes (Signed)
[facility] Provider Note    Event Date/Time   First MD Initiated Contact with Patient 11/22/22 1610     (approximate)   History   Chest Pain   HPI Leslie [name] is a 57 y.o. female with history of HTN, HFpEF, COPD, HLD, A-fib, mitral valve replacement, anticoagulation on warfarin, CKD stage IV presenting today for chest pain.  Patient states over the past 4 days she has had pain on the left side of her chest and left flank.  She started noticing pain symptoms spreading throughout her abdomen across the upper region.  She has had some associated nausea and shortness of breath.  Denies diaphoresis, vomiting, diarrhea, constipation, dysuria, hematuria, leg pain, leg swelling, fever, chills.  Chart review: Reviewed patient's most recent chart notes from cardiology.     Physical Exam   Triage Vital Signs: ED Triage Vitals  Encounter Vitals Group     BP 11/22/22 1558 96/66     Systolic BP Percentile --      Diastolic BP Percentile --      Pulse Rate 11/22/22 1558 74     Resp 11/22/22 1558 19     Temp 11/22/22 1558 98 F (36.7 C)     Temp src --      SpO2 11/22/22 1558 98 %     Weight --      Height --      Head Circumference --      Peak Flow --      Pain Score 11/22/22 1557 7     Pain Loc --      Pain Education --      Exclude from Growth Chart --     Most recent vital signs: Vitals:   11/22/22 1558 11/22/22 1822  BP: 96/66 110/64  Pulse: 74 (!) 55  Resp: 19 15  Temp: 98 F (36.7 C)   SpO2: 98% 100%   Physical Exam: I have reviewed the vital signs and nursing notes. General: Awake, alert, no acute distress.  Nontoxic appearing. Head:  Atraumatic, normocephalic.   ENT:  EOM intact, PERRL. Oral mucosa is pink and moist with no lesions. Neck: Neck is supple with full range of motion, No meningeal signs. Cardiovascular:  RRR, No murmurs. Peripheral pulses palpable and equal bilaterally. Respiratory:  Symmetrical chest wall expansion.   No rhonchi, rales, or wheezes.  Good air movement throughout.  No use of accessory muscles.   Musculoskeletal:  No cyanosis or edema. Moving extremities with full ROM Abdomen:  Soft, tenderness to palpation in right upper quadrant, epigastric, and left upper quadrant, nondistended. Neuro:  GCS 15, moving all four extremities, interacting appropriately. Speech clear. Psych:  Calm, appropriate.   Skin:  Warm, dry, no rash.    ED Results / Procedures / Treatments   Labs (all labs ordered are listed, but only abnormal results are displayed) Labs Reviewed  BASIC METABOLIC PANEL - Abnormal; Notable for the following components:      Result Value   Glucose, Bld 156 (*)    BUN 44 (*)    Creatinine, Ser 2.28 (*)    GFR, Estimated 25 (*)    All other components within normal limits  CBC - Abnormal; Notable for the following components:   RBC 3.51 (*)    Hemoglobin 10.4 (*)    HCT 32.5 (*)    All other components within normal limits  PROTIME-INR - Abnormal; Notable for the following components:   Prothrombin Time 28.6 (*)  INR 2.7 (*)    All other components within normal limits  URINALYSIS, ROUTINE W REFLEX MICROSCOPIC - Abnormal; Notable for the following components:   Color, Urine STRAW (*)    APPearance CLEAR (*)    All other components within normal limits  RESP PANEL BY RT-PCR (RSV, FLU A&B, COVID)  RVPGX2  LIPASE, BLOOD  HEPATIC FUNCTION PANEL  TROPONIN I (HIGH SENSITIVITY)  TROPONIN I (HIGH SENSITIVITY)     EKG My EKG interpretation: Rate of 63, normal sinus rhythm, normal axis, normal intervals.  No acute ST elevations or depressions   RADIOLOGY Independently interpreted chest x-ray and CT abdomen/pelvis with overall no acute pathology   PROCEDURES:  Critical Care performed: No  Procedures   MEDICATIONS ORDERED IN ED: Medications - No data to display   IMPRESSION / MDM / ASSESSMENT AND PLAN / ED COURSE  I reviewed the triage vital signs and the nursing  notes.                              Differential diagnosis includes, but is not limited to, pancreatitis, gastritis, SBO, constipation, ACS, pneumonia  Patient's presentation is most consistent with acute complicated illness / injury requiring diagnostic workup.  Patient is a 57 year old female presenting today for intermittent chest pain, abdominal pain over the past 3 to 4 days.  Exam is most notable for abdominal tenderness to palpation in the epigastric region and the left upper quadrant.  EKG unremarkable.  Troponins negative x 2 with low concern for ACS at this time.  Patient has a heart score of 3.  CBC largely unremarkable and BMP consistent with her prior baseline with CKD.  INR within normal range for her mitral valve replacement.  UA shows no UTI.  Lipase normal.  Paddock function normal.  Negative viral panel.  Chest x-ray with no acute pathology.  CT abdomen/pelvis shows no acute abdominal pathology outside of moderate stool burden.  Patient does note intermittent cramping sensation with fluctuating severity which could indicate peristalsis and movement of bowel.  Reassessed and no ongoing pain symptoms.  At this time, do think patient is safe for discharge for treatment of constipation as most likely source of symptoms.  Was told to follow-up with her cardiologist as well given intermittent chest pain symptoms over the past couple of days.  Patient given strict return precautions and agreeable with plan.  The patient is on the cardiac monitor to evaluate for evidence of arrhythmia and/or significant heart rate changes. Clinical Course as of 11/22/22 2037  Mon Nov 22, 2022  1702 Basic metabolic panel(!) Creatinine largely comparable to baseline [DW]  1702 CBC(!) Anemia consistent with baseline [DW]  1703 Heart score of 3 based on age and risk factors. [DW]  1738 INR(!): 2.7 In appropriate range for warfarin for mitral valve replacement [DW]  1745 Urinalysis, Routine w reflex  microscopic -Urine, Clean Catch(!) No UTI [DW]  1841 Troponin I (High Sensitivity): 6 Troponins negative x2.  Low concern for ACS at this time [DW]  2035 Reassessed patient.  No ongoing pain symptoms.  Will discharge at this time [DW]    Clinical Course User Index [DW] Janith Lima, MD     FINAL CLINICAL IMPRESSION(S) / ED DIAGNOSES   Final diagnoses:  Abdominal pain, unspecified abdominal location  Chest pain, unspecified type  Constipation, unspecified constipation type     Rx / DC Orders   ED Discharge Orders  Ordered    Ambulatory referral to Cardiology       Comments: If you have not heard from the Cardiology office within the next 72 hours please call [phone].   11/22/22 2036             Note:  This document was prepared using Dragon voice recognition software and may include unintentional dictation errors.   Janith Lima, MD 11/22/22 2040

## 2022-11-22 NOTE — Discharge Instructions (Signed)
Laboratory workup was reassuring today.  CT imaging does show evidence of moderate amount of stool throughout your colon likely representing constipation.  This could be the source of your symptoms today.  Recommend you restart the Linzess as well as take MiraLAX over-the-counter as needed to help with symptoms.  Please return to the emergency department any worsening symptoms.  Please follow-up with your cardiologist for evaluation of your chest pain.

## 2022-11-23 LAB — CULTURE, URINE COMPREHENSIVE

## 2022-11-29 ENCOUNTER — Encounter: Payer: Self-pay | Admitting: Nurse Practitioner

## 2022-11-29 ENCOUNTER — Ambulatory Visit (INDEPENDENT_AMBULATORY_CARE_PROVIDER_SITE_OTHER): Payer: Medicare Other | Admitting: Nurse Practitioner

## 2022-11-29 ENCOUNTER — Telehealth: Payer: Self-pay | Admitting: Physician Assistant

## 2022-11-29 VITALS — BP 124/72 | HR 68 | Temp 98.1°F | Resp 16 | Ht 64.0 in | Wt 248.0 lb

## 2022-11-29 DIAGNOSIS — R109 Unspecified abdominal pain: Secondary | ICD-10-CM

## 2022-11-29 DIAGNOSIS — R140 Abdominal distension (gaseous): Secondary | ICD-10-CM | POA: Diagnosis not present

## 2022-11-29 DIAGNOSIS — R1084 Generalized abdominal pain: Secondary | ICD-10-CM

## 2022-11-29 DIAGNOSIS — E1165 Type 2 diabetes mellitus with hyperglycemia: Secondary | ICD-10-CM

## 2022-11-29 MED ORDER — HYOSCYAMINE SULFATE ER 0.375 MG PO TB12: 0.3750 mg | ORAL_TABLET | Freq: Two times a day (BID) | ORAL | 0 refills | Status: DC

## 2022-11-29 NOTE — Progress Notes (Signed)
[facility] [address] [place], [place] [address]  Internal MEDICINE  Office Visit Note  Patient Name: Leslie [name]  [id number]  [id number]  Date of Service: 11/29/2022  Chief Complaint  Patient presents with   Gastroesophageal Reflux   Hypertension   Hyperlipidemia   Follow-up    ED f/u    HPI Victory presents for a follow-up visit for recent ED visit for abdominal pain Recent ED visit for abdominal pain Has been going on constant for over 1 year. Has been previously evaluated by gastroenterology Has had multiple imaging studies with no definitive findings.  Does have bloating with abdominal cramping and pain.      Current Medication: Outpatient Encounter Medications as of 11/29/2022  Medication Sig   acetaminophen-codeine (TYLENOL #3) 300-30 MG tablet Take 1 tablet by mouth every 6 (six) hours as needed for moderate pain (pain score 4-6).   ALPRAZolam (XANAX) 0.5 MG tablet TAKE 1/2 TABLET BY MOUTH AT BEDTIME AS NEEDED FOR ANXIETY   amLODipine (NORVASC) 5 MG tablet Take 1 tablet (5 mg total) by mouth daily.   aspirin 81 MG EC tablet Take 81 mg by mouth daily.   atorvastatin (LIPITOR) 80 MG tablet Take 1 tablet (80 mg total) by mouth daily.   calcium carbonate (TUMS - DOSED IN MG ELEMENTAL CALCIUM) 500 MG chewable tablet Chew 500 mg by mouth daily as needed for indigestion or heartburn.   chlorpheniramine-HYDROcodone (TUSSIONEX) 10-8 MG/5ML Take 5 mLs by mouth every 12 (twelve) hours as needed.   Dulaglutide (TRULICITY) 0.75 MG/0.5ML SOPN Inject 0.75 mg into the skin once a week.   hyoscyamine (LEVBID) 0.375 MG 12 hr tablet Take 1 tablet (0.375 mg total) by mouth 2 (two) times daily.   Insulin Pen Needle 32G X 6 MM MISC Use with victoza   linaclotide (LINZESS) 145 MCG CAPS capsule Take 1 capsule (145 mcg total) by mouth daily before breakfast.   losartan (COZAAR) 50 MG tablet Take 50 mg by mouth daily.   metoprolol succinate (TOPROL-XL) 100 MG 24 hr  tablet Take 1 tablet (100 mg total) by mouth in the morning and at bedtime.   nitrofurantoin, macrocrystal-monohydrate, (MACROBID) 100 MG capsule Take 1 capsule (100 mg total) by mouth 2 (two) times daily.   omeprazole (PRILOSEC) 40 MG capsule TAKE 1 CAPSULE (40 MG TOTAL) BY MOUTH DAILY.   ondansetron (ZOFRAN) 8 MG tablet Take 8 mg by mouth every 8 (eight) hours as needed.   ondansetron (ZOFRAN-ODT) 4 MG disintegrating tablet Take 1 tablet (4 mg total) by mouth every 8 (eight) hours as needed for nausea or vomiting.   potassium chloride SA (KLOR-CON M20) 20 MEQ tablet TAKE 2 TABLETS BY MOUTH TWICE A DAY   spironolactone (ALDACTONE) 25 MG tablet Take 1 tablet (25 mg total) by mouth daily.   tamsulosin (FLOMAX) 0.4 MG CAPS capsule Take 1 capsule (0.4 mg total) by mouth daily.   torsemide (DEMADEX) 20 MG tablet Take 1 tablet (20 mg total) by mouth 2 (two) times daily.   traZODone (DESYREL) 50 MG tablet TAKE 1 TABLET BY MOUTH EVERYDAY AT BEDTIME   Vitamin D, Ergocalciferol, (DRISDOL) 1.25 MG (50000 UNIT) CAPS capsule TAKE 1 CAPSULE BY MOUTH ONE TIME PER WEEK   warfarin (COUMADIN) 2 MG tablet TAKE 1/2 TO 1 TABLET BY MOUTH DAILY AS DIRECTED BY THE ANTI-COAG CLINIC (Patient taking differently: Take 1-2 mg by mouth See admin instructions. Take 2 mg every day every day except on Sunday Thursday take 1 mg  per patient)   No facility-administered encounter medications on file as of 11/29/2022.    Surgical History: Past Surgical History:  Procedure Laterality Date   ABDOMINAL HYSTERECTOMY     total   ABDOMINAL HYSTERECTOMY     BREAST BIOPSY Bilateral 2012   BREAST BIOPSY Right 08-07-12   fibroadenomatous changes and columnar cells   BREAST BIOPSY  02/03/2015   stereo byrnett   BUBBLE STUDY  04/01/2020   Procedure: BUBBLE STUDY;  Surgeon: Parke Poisson, MD;  Location: [facility] ENDOSCOPY;  Service: Cardiovascular;;   CARDIAC CATHETERIZATION     CHOLECYSTECTOMY N/A 02/27/2016   Procedure: LAPAROSCOPIC  CHOLECYSTECTOMY;  Surgeon: Kieth Brightly, MD;  Location: [facility] ORS;  Service: General;  Laterality: N/A;   CLIPPING OF ATRIAL APPENDAGE  04/29/2020   Procedure: CLIPPING OF ATRIAL APPENDAGE USING ATRICURE  PRO2 CLIP SIZE ;  Surgeon: Purcell Nails, MD;  Location: [facility] OR;  Service: Open Heart Surgery;;   COLONOSCOPY WITH PROPOFOL N/A 09/26/2018   Procedure: COLONOSCOPY WITH PROPOFOL;  Surgeon: Midge Minium, MD;  Location: [facility] ENDOSCOPY;  Service: Endoscopy;  Laterality: N/A;   COLONOSCOPY WITH PROPOFOL N/A 07/16/2021   Procedure: COLONOSCOPY WITH PROPOFOL;  Surgeon: Midge Minium, MD;  Location: [facility] ENDOSCOPY;  Service: Endoscopy;  Laterality: N/A;   CYSTOSCOPY W/ RETROGRADES Right 08/18/2018   Procedure: CYSTOSCOPY WITH RETROGRADE PYELOGRAM;  Surgeon: Sondra Come, MD;  Location: [facility] ORS;  Service: Urology;  Laterality: Right;   CYSTOSCOPY/URETEROSCOPY/HOLMIUM LASER/STENT PLACEMENT Right 08/18/2018   Procedure: CYSTOSCOPY/URETEROSCOPY/STENT PLACEMENT;  Surgeon: Sondra Come, MD;  Location: [facility] ORS;  Service: Urology;  Laterality: Right;   DIAGNOSTIC LAPAROSCOPY     ESOPHAGOGASTRODUODENOSCOPY N/A 07/16/2021   Procedure: ESOPHAGOGASTRODUODENOSCOPY (EGD);  Surgeon: Midge Minium, MD;  Location: [facility] ENDOSCOPY;  Service: Endoscopy;  Laterality: N/A;   ESOPHAGOGASTRODUODENOSCOPY (EGD) WITH PROPOFOL N/A 09/26/2018   Procedure: ESOPHAGOGASTRODUODENOSCOPY (EGD) WITH PROPOFOL;  Surgeon: Midge Minium, MD;  Location: [facility] ENDOSCOPY;  Service: Endoscopy;  Laterality: N/A;   GIVENS CAPSULE STUDY N/A 11/03/2018   Procedure: GIVENS CAPSULE STUDY;  Surgeon: Midge Minium, MD;  Location: [facility] ENDOSCOPY;  Service: Endoscopy;  Laterality: N/A;   JOINT REPLACEMENT Left    knee   KNEE ARTHROPLASTY Right 04/09/2019   Procedure: COMPUTER ASSISTED TOTAL KNEE ARTHROPLASTY;  Surgeon: Donato Heinz, MD;  Location: [facility] ORS;  Service: Orthopedics;  Laterality: Right;   KNEE CLOSED REDUCTION Left 04/15/2015    Procedure: CLOSED MANIPULATION KNEE;  Surgeon: Juanell Fairly, MD;  Location: [facility] ORS;  Service: Orthopedics;  Laterality: Left;   KNEE SURGERY     MAZE N/A 04/29/2020   Procedure: MAZE;  Surgeon: Purcell Nails, MD;  Location[facility] OR;  Service: Open Heart Surgery;  Laterality: N/A;   MITRAL VALVE REPLACEMENT N/A 04/29/2020   Procedure: MITRAL VALVE (MV) REPLACEMENT USING ON-X VALVE SIZE 27/29MM;  Surgeon: Purcell Nails, MD;  Location: [facility] OR;  Service: Open Heart Surgery;  Laterality: N/A;   MULTIPLE EXTRACTIONS WITH ALVEOLOPLASTY N/A 02/28/2020   Procedure: MULTIPLE EXTRACTION WITH ALVEOLOPLASTY;  Surgeon: Sharman Cheek, DMD;  Location: [facility] OR;  Service: Dentistry;  Laterality: N/A;   RIGHT/LEFT HEART CATH AND CORONARY ANGIOGRAPHY Bilateral 09/06/2019   Procedure: RIGHT/LEFT HEART CATH AND CORONARY ANGIOGRAPHY;  Surgeon: Antonieta Iba, MD;  Location: [facility] INVASIVE CV LAB;  Service: Cardiovascular;  Laterality: Bilateral;   TEE WITHOUT CARDIOVERSION N/A 09/13/2017   Procedure: TRANSESOPHAGEAL ECHOCARDIOGRAM (TEE);  Surgeon: Yvonne Kendall, MD;  Location: [facility] ORS;  Service: Cardiovascular;  Laterality: N/A;  TEE WITHOUT CARDIOVERSION N/A 04/01/2020   Procedure: TRANSESOPHAGEAL ECHOCARDIOGRAM (TEE);  Surgeon: Parke Poisson, MD;  Location: [facility] ENDOSCOPY;  Service: Cardiovascular;  Laterality: N/A;   TEE WITHOUT CARDIOVERSION N/A 04/29/2020   Procedure: TRANSESOPHAGEAL ECHOCARDIOGRAM (TEE);  Surgeon: Purcell Nails, MD;  Location: [facility] OR;  Service: Open Heart Surgery;  Laterality: N/A;   TOTAL KNEE ARTHROPLASTY Left 12/25/2014   Procedure: TOTAL KNEE ARTHROPLASTY;  Surgeon: Juanell Fairly, MD;  Location: [facility] ORS;  Service: Orthopedics;  Laterality: Left;   TUBAL LIGATION      Medical History: Past Medical History:  Diagnosis Date   (HFpEF) heart failure with preserved ejection fraction (HCC)    a. 08/2017 Echo: EF 55-60%.  Grade 2 diastolic dysfunction; b. 05/2020 Echo: EF 50-55%,  no rwma, Nl RV fxn, nl fxn'ing mech MV.   Allergy    Anemia    Anxiety    Asthma    BRCA negative 03/22/2013   Bronchitis 02/19/2016   ON LEVAQUIN PO   Chest tightness    a. 08/2019 Cath: nl cors.   Cigarette smoker 09/11/2017   8-10 day   Dysrhythmia    GERD (gastroesophageal reflux disease)    History of kidney stones    Hyperlipidemia    Hypertension    Interstitial lung disease (HCC)    a. CT 2013 b. 02/2018 CXR noted recurrent intersistial changes ILD vs chronic bronchitis   Moderate mitral stenosis    a.  08/2017 TEE: EF 60 to 65%.  Moderate mitral stenosis.  Mean gradient 14 mmHg.  Valve area 2.59 cm by planimetry, 2.72 cm by pressure half-time.   PAF (paroxysmal atrial fibrillation) (HCC)    a. 08/2017 s/p TEE/DCCV; b. CHA2DS2VASc = 2-->warfarin.   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 04/2020 s/p 27/29 mm Onyx mech mitral valve-->chronic coumadin; b. 05/2020 Echo: Nl fxn'ing mech MV.    Family History: Family History  Problem Relation Age of Onset   Breast cancer Mother    Cancer Mother 95       breast   Hypertension Mother    Osteoarthritis Father    Hypertension Father    Cancer Maternal Aunt        breast   Breast cancer Maternal Grandmother    Cancer Maternal Grandmother        breast    Social History   Socioeconomic History   Marital status: Married    Spouse name: Not on file   Number of children: Not on file   Years of education: Not on file   Highest education level: Not on file  Occupational History   Not on file  Tobacco Use   Smoking status: Former    Current packs/day: 0.00    Average packs/day: 0.5 packs/day for 20.0 years (10.0 ttl pk-yrs)    Types: Cigarettes    Start date: 01/1996    Quit date: 01/2016     Years since quitting: 6.8   Smokeless tobacco: Never  Vaping Use   Vaping status: Never Used  Substance and Sexual Activity   Alcohol use: Not Currently    Comment: rarely   Drug use: No   Sexual activity: Not on file  Other Topics Concern   Not on file  Social History Narrative  Not on file   Social Determinants of Health   Financial Resource Strain: Low Risk  (05/07/2020)   Overall Financial Resource Strain (CARDIA)    Difficulty of Paying Living Expenses: Not very hard  Food Insecurity: Unknown (11/16/2021)   Hunger Vital Sign    Worried About Running Out of Food in the Last Year: Not on file    Ran Out of Food in the Last Year: Never true  Transportation Needs: No Transportation Needs (11/16/2021)   PRAPARE - Administrator, Civil Service (Medical): No    Lack of Transportation (Non-Medical): No  Physical Activity: Not on file  Stress: Not on file  Social Connections: Not on file  Intimate Partner Violence: Not At Risk (11/16/2021)   Humiliation, Afraid, Rape, and Kick questionnaire    Fear of Current or Ex-Partner: No    Emotionally Abused: No    Physically Abused: No    Sexually Abused: No      Review of Systems  Constitutional:  Negative for chills, fatigue and unexpected weight change.  HENT:  Negative for congestion, rhinorrhea, sneezing and sore throat.   Eyes:  Negative for redness.  Respiratory:  Negative for cough, chest tightness and shortness of breath.   Cardiovascular:  Negative for chest pain and palpitations.  Gastrointestinal:  Positive for abdominal pain. Negative for constipation, diarrhea, nausea and vomiting.  Genitourinary:  Negative for dysuria and frequency.  Musculoskeletal:  Positive for back pain. Negative for arthralgias, joint swelling and neck pain.  Skin:  Negative for rash.  Neurological: Negative.  Negative for tremors and numbness.  Hematological:  Negative for adenopathy. Does not bruise/bleed easily.   Psychiatric/Behavioral:  Positive for sleep disturbance. Negative for behavioral problems (Depression) and suicidal ideas. The patient is nervous/anxious.     Vital Signs: BP 124/72   Pulse 68   Temp 98.1 F (36.7 C)   Resp 16   Ht 5\' 4"  (1.626 m)   Wt 248 lb (112.5 kg)   SpO2 98%   BMI 42.57 kg/m    Physical Exam Vitals reviewed.  Constitutional:      General: She is not in acute distress.    Appearance: Normal appearance. She is obese. She is not ill-appearing.  HENT:     Head: Normocephalic and atraumatic.  Eyes:     Pupils: Pupils are equal, round, and reactive to light.  Cardiovascular:     Rate and Rhythm: Normal rate and regular rhythm.  Pulmonary:     Effort: Pulmonary effort is normal. No respiratory distress.  Abdominal:     General: There is distension.     Palpations: Abdomen is soft.     Tenderness: There is abdominal tenderness.  Musculoskeletal:        General: Normal range of motion.  Skin:    General: Skin is warm and dry.  Neurological:     Mental Status: She is alert and oriented to person, place, and time.  Psychiatric:        Mood and Affect: Mood normal.        Behavior: Behavior normal.        Assessment/Plan: 1. Abdominal bloating with cramps (Primary) Try this medication and see if it helps with the abdominal pain. Will follow up with Dr. Beverely Risen for further evaluation - hyoscyamine (LEVBID) 0.375 MG 12 hr tablet; Take 1 tablet (0.375 mg total) by mouth 2 (two) times daily.  Dispense: 60 tablet; Refill: 0  2. Diffuse abdominal pain Will try  hyoscyamine until follow up visit with Dr. Welton Flakes.  - hyoscyamine (LEVBID) 0.375 MG 12 hr tablet; Take 1 tablet (0.375 mg total) by mouth 2 (two) times daily.  Dispense: 60 tablet; Refill: 0    General Counseling: Cherrill verbalizes understanding of the findings of todays visit and agrees with plan of treatment. I have discussed any further diagnostic evaluation that may be needed or ordered  today. We also reviewed her medications today. she has been encouraged to call the office with any questions or concerns that should arise related to todays visit.    No orders of the defined types were placed in this encounter.   Meds ordered this encounter  Medications   hyoscyamine (LEVBID) 0.375 MG 12 hr tablet    Sig: Take 1 tablet (0.375 mg total) by mouth 2 (two) times daily.    Dispense:  60 tablet    Refill:  0    Return in about 2 weeks (around 12/13/2022) for F/U, eval new med and Abd pain with D[facility].   Total time spent:30 Minutes Time spent includes review of chart, medications, test results, and follow up plan with the patient.   [place] Controlled Substance Database was reviewed by me.  This patient was seen by Sallyanne Kuster, FNP-C in collaboration with Dr. Beverely Risen as a part of collaborative care agreement.   Nelli Swalley R. Tedd Sias, MSN, FNP-C Internal medicine

## 2022-11-29 NOTE — Telephone Encounter (Signed)
SS order faxed to [facility]; [phone]

## 2022-12-06 ENCOUNTER — Other Ambulatory Visit
Admission: RE | Admit: 2022-12-06 | Discharge: 2022-12-06 | Disposition: A | Discharge status: Home / Self Care | Payer: Medicare Other | Source: Ambulatory Visit | Attending: Medical | Admitting: Medical

## 2022-12-06 ENCOUNTER — Ambulatory Visit: Payer: Medicare Other | Attending: Medical | Admitting: Medical

## 2022-12-06 ENCOUNTER — Encounter: Payer: Self-pay | Admitting: Medical

## 2022-12-06 ENCOUNTER — Other Ambulatory Visit: Payer: Self-pay

## 2022-12-06 ENCOUNTER — Telehealth: Payer: Self-pay

## 2022-12-06 ENCOUNTER — Telehealth: Payer: Self-pay | Admitting: Cardiovascular Disease

## 2022-12-06 VITALS — BP 144/60 | HR 60 | Ht 64.0 in | Wt 247.5 lb

## 2022-12-06 DIAGNOSIS — I050 Rheumatic mitral stenosis: Secondary | ICD-10-CM | POA: Insufficient documentation

## 2022-12-06 DIAGNOSIS — I480 Paroxysmal atrial fibrillation: Secondary | ICD-10-CM | POA: Insufficient documentation

## 2022-12-06 DIAGNOSIS — R079 Chest pain, unspecified: Secondary | ICD-10-CM | POA: Diagnosis not present

## 2022-12-06 DIAGNOSIS — I5032 Chronic diastolic (congestive) heart failure: Secondary | ICD-10-CM | POA: Diagnosis present

## 2022-12-06 DIAGNOSIS — Z954 Presence of other heart-valve replacement: Secondary | ICD-10-CM | POA: Diagnosis not present

## 2022-12-06 LAB — TROPONIN I (HIGH SENSITIVITY): Troponin I (High Sensitivity): 8 ng/L (ref <18)

## 2022-12-06 MED ORDER — WARFARIN SODIUM 2 MG PO TABS: ORAL_TABLET | ORAL | 1 refills | Status: DC

## 2022-12-06 NOTE — Progress Notes (Signed)
Cardiology Office Note:    Date:  12/06/2022   ID:  Leslie [name], DOB 1965/12/12, MRN [id number]  PCP:  Carlean Jews, PA-C  [facility] Cardiologist:  Julien Nordmann, MD  [facility] Electrophysiologist:  None   Referring MD: Carlean Jews, PA*   Chief Complaint: chest pain  History of Present Illness:    Leslie [name] is a 57 y.o. female with a hx of mitral valve stenosis s/p MVR with concomitant MAZE procedure, morbid obesity, HLD, COPD who presents for chest pain.   The patient was hospitalized in 08/2017 for chest tightness. She was found to be in afib RVR and underwent TEE that showed moderate mitral stenosis and she was cardioverted. Cardiac CTA  in 2020 for chest tightness showed nonobstructive CAD. She had progressive mitral stenosis on serial echocardiograms with severe mitral stenosis on echo in 06/2019. R/L heart cath showed moderate pulmonary HTN with no significant coronary disease. Patient was referred to Dr. Cornelius Moras of CT surgery and and she underwent mitral valve replacement with ON-X bileaflet mechanical valve with MAZE procedure in 04/2020. She was started on coumadin post-procedure. Follow-up echo 05/2020 showed EF 50-55% with no MV regurgitation. Heart monitor showed NSR, average HR 77bpm, 7 runs of SVT, longest lasting 6 beats, no afib.   Echo in 2023 showed well functioning valve.   She was seen in the ER 11/4 for chest pain. BP Was soft. She was tender in the right upper abdomen. HS trop was negative x2. Work-up was overall unremarkable.  Today, the patient reports aching and burning sensation occurs the left chest into the back. She has right shoulder pain as well. She reports associated nausea, no vomiting. Pain is coming and going. It's worse with exertion. She takes pills for acid reflux. She has active pain, 7/10 today. she has a little shortness of breath with the pain. She denies lower leg edema. She has chronic orthopnea and pnd. She has noticed  intermittent heart racing. She has been drinking water and taking Tylenol.   Past Medical History:  Diagnosis Date   (HFpEF) heart failure with preserved ejection fraction (HCC)    a. 08/2017 Echo: EF 55-60%.  Grade 2 diastolic dysfunction; b. 05/2020 Echo: EF 50-55%, no rwma, Nl RV fxn, nl fxn'ing mech MV.   Allergy    Anemia    Anxiety    Asthma    BRCA negative 03/22/2013   Bronchitis 02/19/2016   ON LEVAQUIN PO   Chest tightness    a. 08/2019 Cath: nl cors.   Cigarette smoker 09/11/2017   8-10 day   Dysrhythmia    GERD (gastroesophageal reflux disease)    History of kidney stones    Hyperlipidemia    Hypertension    Interstitial lung disease (HCC)    a. CT 2013 b. 02/2018 CXR noted recurrent intersistial changes ILD vs chronic bronchitis   Moderate mitral stenosis    a.  08/2017 TEE: EF 60 to 65%.  Moderate mitral stenosis.  Mean gradient 14 mmHg.  Valve area 2.59 cm by planimetry, 2.72 cm by pressure half-time.   PAF (paroxysmal atrial fibrillation) (HCC)    a. 08/2017 s/p TEE/DCCV; b. CHA2DS2VASc = 2-->warfarin.   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 04/2020 s/p 27/29 mm Onyx  mech mitral valve-->chronic coumadin; b. 05/2020 Echo: Nl fxn'ing mech MV.    Past Surgical History:  Procedure Laterality Date   ABDOMINAL HYSTERECTOMY     total   ABDOMINAL HYSTERECTOMY     BREAST BIOPSY Bilateral 2012   BREAST BIOPSY Right 08-07-12   fibroadenomatous changes and columnar cells   BREAST BIOPSY  02/03/2015   stereo byrnett   BUBBLE STUDY  04/01/2020   Procedure: BUBBLE STUDY;  Surgeon: Parke Poisson, MD;  [facility]: [facility];  Service: Cardiovascular;;   CARDIAC CATHETERIZATION     CHOLECYSTECTOMY N/A 02/27/2016   Procedure: LAPAROSCOPIC  CHOLECYSTECTOMY;  Surgeon: Kieth Brightly, MD;  Location: [facility] ORS;  Service: General;  Laterality: N/A;   CLIPPING OF ATRIAL APPENDAGE  04/29/2020   Procedure: CLIPPING OF ATRIAL APPENDAGE USING ATRICURE  PRO2 CLIP SIZE ;  Surgeon: Purcell Nails, MD;  Location: [facility] OR;  Service: Open Heart Surgery;;   COLONOSCOPY WITH PROPOFOL N/A 09/26/2018   Procedure: COLONOSCOPY WITH PROPOFOL;  Surgeon: Midge Minium, MD;  Location: [facility] ENDOSCOPY;  Service: Endoscopy;  Laterality: N/A;   COLONOSCOPY WITH PROPOFOL N/A 07/16/2021   Procedure: COLONOSCOPY WITH PROPOFOL;  Surgeon: Midge Minium, MD;  Location: [facility] ENDOSCOPY;  Service: Endoscopy;  Laterality: N/A;   CYSTOSCOPY W/ RETROGRADES Right 08/18/2018   Procedure: CYSTOSCOPY WITH RETROGRADE PYELOGRAM;  Surgeon: Sondra Come, MD;  Location: [facility] ORS;  Service: Urology;  Laterality: Right;   CYSTOSCOPY/URETEROSCOPY/HOLMIUM LASER/STENT PLACEMENT Right 08/18/2018   Procedure: CYSTOSCOPY/URETEROSCOPY/STENT PLACEMENT;  Surgeon: Sondra Come, MD;  Location: [facility] ORS;  Service: Urology;  Laterality: Right;   DIAGNOSTIC LAPAROSCOPY     ESOPHAGOGASTRODUODENOSCOPY N/A 07/16/2021   Procedure: ESOPHAGOGASTRODUODENOSCOPY (EGD);  Surgeon: Midge Minium, MD;  Location: [facility] ENDOSCOPY;  Service: Endoscopy;  Laterality: N/A;   ESOPHAGOGASTRODUODENOSCOPY (EGD) WITH PROPOFOL N/A 09/26/2018   Procedure: ESOPHAGOGASTRODUODENOSCOPY (EGD) WITH PROPOFOL;  Surgeon: Midge Minium, MD;  Location: [facility] ENDOSCOPY;  Service: Endoscopy;  Laterality: N/A;   GIVENS CAPSULE STUDY N/A 11/03/2018   Procedure: GIVENS CAPSULE STUDY;  Surgeon: Midge Minium, MD;  Location: [facility] ENDOSCOPY;  Service: Endoscopy;  Laterality: N/A;   JOINT REPLACEMENT Left    knee   KNEE ARTHROPLASTY Right 04/09/2019   Procedure: COMPUTER ASSISTED TOTAL KNEE ARTHROPLASTY;  Surgeon: Donato Heinz, MD;  Location: [facility] ORS;  Service: Orthopedics;  Laterality: Right;   KNEE CLOSED REDUCTION Left 04/15/2015    Procedure: CLOSED MANIPULATION KNEE;  Surgeon: Juanell Fairly, MD;  Location: [facility] ORS;  Service: Orthopedics;  Laterality: Left;   KNEE SURGERY     MAZE N/A 04/29/2020   Procedure: MAZE;  Surgeon: Purcell Nails, MD;  Location: [facility] OR;  Service: Open Heart Surgery;  Laterality: N/A;   MITRAL VALVE REPLACEMENT N/A 04/29/2020   Procedure: MITRAL VALVE (MV) REPLACEMENT USING ON-X VALVE SIZE 27/29MM;  Surgeon: Purcell Nails, MD;  Location: [facility] OR;  Service: Open Heart Surgery;  Laterality: N/A;   MULTIPLE EXTRACTIONS WITH ALVEOLOPLASTY N/A 02/28/2020   Procedure: MULTIPLE EXTRACTION WITH ALVEOLOPLASTY;  Surgeon: Sharman Cheek, DMD;  Location: [facility] OR;  Service: Dentistry;  Laterality: N/A;   RIGHT/LEFT HEART CATH AND CORONARY ANGIOGRAPHY Bilateral 09/06/2019   Procedure: RIGHT/LEFT HEART CATH AND CORONARY ANGIOGRAPHY;  Surgeon: Antonieta Iba, MD;  Location: [facility] INVASIVE CV LAB;  Service: Cardiovascular;  Laterality: Bilateral;   TEE WITHOUT CARDIOVERSION N/A 09/13/2017   Procedure: TRANSESOPHAGEAL ECHOCARDIOGRAM (TEE);  Surgeon: Yvonne Kendall, MD;  Location: [facility] ORS;  Service: Cardiovascular;  Laterality: N/A;   TEE WITHOUT  CARDIOVERSION N/A 04/01/2020   Procedure: TRANSESOPHAGEAL ECHOCARDIOGRAM (TEE);  Surgeon: Parke Poisson, MD;  Location: [facility] ENDOSCOPY;  Service: Cardiovascular;  Laterality: N/A;   TEE WITHOUT CARDIOVERSION N/A 04/29/2020   Procedure: TRANSESOPHAGEAL ECHOCARDIOGRAM (TEE);  Surgeon: Purcell Nails, MD;  Location: [facility] OR;  Service: Open Heart Surgery;  Laterality: N/A;   TOTAL KNEE ARTHROPLASTY Left 12/25/2014   Procedure: TOTAL KNEE ARTHROPLASTY;  Surgeon: Juanell Fairly, MD;  Location: [facility] ORS;  Service: Orthopedics;  Laterality: Left;   TUBAL LIGATION      Current Medications: Current Meds  Medication Sig   acetaminophen-codeine (TYLENOL #3) 300-30 MG tablet Take 1 tablet by mouth every 6 (six) hours as needed for moderate pain (pain score 4-6).   ALPRAZolam  (XANAX) 0.5 MG tablet TAKE 1/2 TABLET BY MOUTH AT BEDTIME AS NEEDED FOR ANXIETY   amLODipine (NORVASC) 5 MG tablet Take 1 tablet (5 mg total) by mouth daily.   aspirin 81 MG EC tablet Take 81 mg by mouth daily.   atorvastatin (LIPITOR) 80 MG tablet Take 1 tablet (80 mg total) by mouth daily.   calcium carbonate (TUMS - DOSED IN MG ELEMENTAL CALCIUM) 500 MG chewable tablet Chew 500 mg by mouth daily as needed for indigestion or heartburn.   Insulin Pen Needle 32G X 6 MM MISC Use with victoza   metoprolol succinate (TOPROL-XL) 100 MG 24 hr tablet Take 1 tablet (100 mg total) by mouth in the morning and at bedtime.   omeprazole (PRILOSEC) 40 MG capsule TAKE 1 CAPSULE (40 MG TOTAL) BY MOUTH DAILY.   potassium chloride SA (KLOR-CON M20) 20 MEQ tablet TAKE 2 TABLETS BY MOUTH TWICE A DAY   spironolactone (ALDACTONE) 25 MG tablet Take 1 tablet (25 mg total) by mouth daily.   torsemide (DEMADEX) 20 MG tablet Take 1 tablet (20 mg total) by mouth 2 (two) times daily.   Vitamin D, Ergocalciferol, (DRISDOL) 1.25 MG (50000 UNIT) CAPS capsule TAKE 1 CAPSULE BY MOUTH ONE TIME PER WEEK   warfarin (COUMADIN) 2 MG tablet TAKE 1/2 TO 1 TABLET BY MOUTH DAILY AS DIRECTED BY THE ANTI-COAG CLINIC (Patient taking differently: Take 1-2 mg by mouth See admin instructions. Take 2 mg every day every day except on Sunday Thursday take 1 mg per patient)     Allergies:   Morphine, Oxycodone hcl, and Dilaudid [hydromorphone hcl]   Social History   Socioeconomic History   Marital status: Married    Spouse name: Not on file   Number of children: Not on file   Years of education: Not on file   Highest education level: Not on file  Occupational History   Not on file  Tobacco Use   Smoking status: Former    Current packs/day: 0.00    Average packs/day: 0.5 packs/day for 20.0 years (10.0 ttl pk-yrs)    Types: Cigarettes    Start date: 01/1996    Quit date: 01/2016    Years since quitting: 6.8   Smokeless tobacco: Never   Vaping Use   Vaping status: Never Used  Substance and Sexual Activity   Alcohol use: Not Currently    Comment: rarely   Drug use: No   Sexual activity: Not on file  Other Topics Concern   Not on file  Social History Narrative   Not on file   Social Determinants of Health   Financial Resource Strain: Low Risk  (05/07/2020)   Overall Financial Resource Strain (CARDIA)    Difficulty of Paying Living Expenses:  Not very hard  Food Insecurity: Unknown (11/16/2021)   Hunger Vital Sign    Worried About Running Out of Food in the Last Year: Not on file    Ran Out of Food in the Last Year: Never true  Transportation Needs: No Transportation Needs (11/16/2021)   PRAPARE - Administrator, Civil Service (Medical): No    Lack of Transportation (Non-Medical): No  Physical Activity: Not on file  Stress: Not on file  Social Connections: Not on file     Family History: The patient's family history includes Breast cancer in her maternal grandmother and mother; Cancer in her maternal aunt and maternal grandmother; Cancer (age of onset: 65) in her mother; Hypertension in her father and mother; Osteoarthritis in her father.  ROS:   Please see the history of present illness.     All other systems reviewed and are negative.  EKGs/Labs/Other Studies Reviewed:    The following studies were reviewed today:  Echo 2023  1. Left ventricular ejection fraction, by estimation, is 60 to 65%. The  left ventricle has normal function. The left ventricle has no regional  wall motion abnormalities. Left ventricular diastolic parameters are  indeterminate.   2. Right ventricular systolic function is normal. The right ventricular  size is normal. There is mildly elevated pulmonary artery systolic  pressure. The estimated right ventricular systolic pressure is 42.5 mmHg.   3. The mitral valve has been repaired/replaced. No evidence of mitral  valve regurgitation. No evidence of mitral stenosis.  The mean mitral valve  gradient is 4.0 mmHg. There is a Onyx mechanical valve present in the  mitral position. Procedure Date:  04/2020.   4. The aortic valve is normal in structure. Aortic valve regurgitation is  not visualized. No aortic stenosis is present. Aortic valve mean gradient  measures 6.3 mmHg. Aortic valve Vmax measures 1.92 m/s.   5. The inferior vena cava is normal in size with greater than 50%  respiratory variability, suggesting right atrial pressure of 3 mmHg.   Heart monitor 10/2020 Event Monitor Patch Wear Time:  6 days and 17 hours (2022-09-02T16:16:54-0400 to 2022-09-09T09:40:50-0400)   Patient had a min HR of 60 bpm, max HR of 167 bpm, and avg HR of 77 bpm.  Predominant underlying rhythm was Sinus Rhythm.    7 Supraventricular Tachycardia runs occurred, the run with the fastest interval lasting 4 beats with a max rate of 167 bpm, the longest lasting 6 beats with an avg rate of 106 bpm.   Isolated SVEs were rare (<1.0%), SVE Couplets were rare (<1.0%), and SVE Triplets were rare (<1.0%). Isolated VEs were rare (<1.0%, 178), VE Couplets were rare (<1.0%, 5), and VE Triplets were rare (<1.0%, 1). Ventricular Bigeminy was present.    No patient triggered events noted   Signed, Dossie Arbour, MD, Ph.D [facility]  EKG:  EKG is ordered today.  The ekg ordered today demonstrates NSR 60bpm, no St/T wave changes  Recent Labs: 06/25/2022: TSH 0.629 09/14/2022: B Natriuretic Peptide 113.1 10/25/2022: Magnesium 2.1 11/22/2022: ALT 25; BUN 44; Creatinine, Ser 2.28; Hemoglobin 10.4; Platelets 331; Potassium 3.9; Sodium 135  Recent Lipid Panel    Component Value Date/Time   CHOL 160 06/25/2022 1012   CHOL 170 06/26/2021 0912   TRIG 185 (H) 06/25/2022 1012   HDL 33 (L) 06/25/2022 1012   HDL 36 (L) 06/26/2021 0912   CHOLHDL 4.8 06/25/2022 1012   VLDL 37 06/25/2022 1012   LDLCALC 90 06/25/2022 1012  LDLCALC 106 (H) 06/26/2021 0912   Physical Exam:    VS:  BP (!)  144/60 (BP Location: Left Arm, Patient Position: Sitting, Cuff Size: Large)   Pulse 60   Ht 5\' 4"  (1.626 m)   Wt 247 lb 8 oz (112.3 kg)   SpO2 98%   BMI 42.48 kg/m     Wt Readings from Last 3 Encounters:  12/06/22 247 lb 8 oz (112.3 kg)  11/29/22 248 lb (112.5 kg)  11/19/22 247 lb 12.8 oz (112.4 kg)     GEN:  Well nourished, well developed in no acute distress HEENT: Normal NECK: No JVD; No carotid bruits LYMPHATICS: No lymphadenopathy CARDIAC: RRR, no murmurs, rubs, gallops RESPIRATORY:  Clear to auscultation without rales, wheezing or rhonchi  ABDOMEN: Soft, +tender RUQ, non-distended MUSCULOSKELETAL:  No edema; No deformity  SKIN: Warm and dry NEUROLOGIC:  Alert and oriented x 3 PSYCHIATRIC:  Normal affect   ASSESSMENT:    1. Chest pain, unspecified type   2. Rheumatic mitral stenosis   3. S/P mitral valve replacement with metallic valve   4. Paroxysmal atrial fibrillation (HCC)   5. Chronic heart failure with preserved ejection fraction (HFpEF) (HCC)    PLAN:    In order of problems listed above:  Chest pain Recent ER visit for chest pain with typical and atypical features. Work-up was negative. She reports intermittent aching and burning sensation across her left chest into her back, worse with exertion. She takes a PPI. She has LUQ tenderness. CT abdomen pelvis in the ER was unremarkable. Prior heart cath in 2021 showed no significant CAD.EKG today shows NSR with no ischemic changes. She reports active 7/10 chest pain today, so I will check a stat troponin. Given CKD, I will order a PET stress test. Continue Aspirin, Lipitor, Toprol and amlodipine.   S/p MVR Echo in 2023 showed normally functioning valve. I will update echocardiogram. Continue Coumadin.  Pafib s/p MAZE EKG shows NSR. Continue coumadin.  Chronic diastolic heart failure with moderate pulmonary HTN She is euvolemic on exam. Update echo as above. Continue Torsemide 20mg  BID, potassium, spironolactone  and toprol.   Disposition: Follow up in 1 month(s) with MD/APP   Shared Decision Making/Informed Consent   Informed Consent   Shared Decision Making/Informed Consent The risks [chest pain, shortness of breath, cardiac arrhythmias, dizziness, blood pressure fluctuations, myocardial infarction, stroke/transient ischemic attack, nausea, vomiting, allergic reaction, radiation exposure, metallic taste sensation and life-threatening complications (estimated to be 1 in 10,000)], benefits (risk stratification, diagnosing coronary artery disease, treatment guidance) and alternatives of a cardiac PET stress test were discussed in detail with Ms. Brunker and she agrees to proceed.       Signed, Makynzi Eastland David Stall, PA-C  12/06/2022 3:05 PM    [facility]

## 2022-12-06 NOTE — Telephone Encounter (Signed)
Pt seen in clinic today requesting coumadin refill.  Will forward to Pharm D

## 2022-12-06 NOTE — Telephone Encounter (Signed)
   Pt c/o of Chest Pain: STAT if active CP, including tightness, pressure, jaw pain, radiating pain to shoulder/upper arm/back, CP unrelieved by Nitro. Symptoms reported of SOB, nausea, vomiting, sweating.  1. Are you having CP right now? Yes  / it has been going on for 4 or 5 day now    2. Are you experiencing any other symptoms (ex. SOB, nausea, vomiting, sweating)? Little Shortness of Breath/ nausea/ heavy in her her chest    3. Is your CP continuous or coming and going? Comes and goes   4. Have you taken Nitroglycerin? No 2   5. How long have you been experiencing CP? 4 or 5 day now     6. If NO CP at time of call then end call with telling Pt to call back or call 911 if Chest pain returns prior to return call from triage team.

## 2022-12-06 NOTE — Patient Instructions (Signed)
Medication Instructions:  Your physician recommends that you continue on your current medications as directed. Please refer to the Current Medication list given to you today.   *If you need a refill on your cardiac medications before your next appointment, please call your pharmacy*   Lab Work: Your provider would like for you to have following labs drawn today Troponin.     Testing/Procedures: Your physician has requested that you have an echocardiogram. Echocardiography is a painless test that uses sound waves to create images of your heart. It provides your doctor with information about the size and shape of your heart and how well your heart's chambers and valves are working.   You may receive an ultrasound enhancing agent through an IV if needed to better visualize your heart during the echo. This procedure takes approximately one hour.  There are no restrictions for this procedure.  This will take place at [address] [facility] [address]) [address], [place] [address]  Please note: We ask at that you not bring children with you during ultrasound (echo/ vascular) testing. Due to room size and safety concerns, children are not allowed in the ultrasound rooms during exams. Our front office staff cannot provide observation of children in our lobby area while testing is being conducted. An adult accompanying a patient to their appointment will only be allowed in the ultrasound room at the discretion of the ultrasound technician under special circumstances. We apologize for any inconvenience.   Cardiac PET scan (Please see instructions below)   Follow-Up: At [facility], you and your health needs are our priority.  As part of our continuing mission to provide you with exceptional heart care, we have created designated Provider Care Teams.  These Care Teams include your primary Cardiologist (physician) and Advanced Practice Providers (APPs -  Physician Assistants and Nurse  Practitioners) who all work together to provide you with the care you need, when you need it.  We recommend signing up for the patient portal called "MyChart".  Sign up information is provided on this After Visit Summary.  MyChart is used to connect with patients for Virtual Visits (Telemedicine).  Patients are able to view lab/test results, encounter notes, upcoming appointments, etc.  Non-urgent messages can be sent to your provider as well.   To learn more about what you can do with MyChart, go to [email or web address].    Your next appointment:   1 month(s)  Provider:   You may see Julien Nordmann, MD or one of the following Advanced Practice Providers on your designated Care Team:   Nicolasa Ducking, NP Eula Listen, PA-C Cadence Fransico Michael, PA-C Charlsie Quest, NP Carlos Levering, NP    How to Prepare for Your Cardiac PET/CT Stress Test:  1. Please do not take these medications before your test:   Medications that may interfere with the cardiac pharmacological stress agent (ex. nitrates - including erectile dysfunction medications, isosorbide mononitrate- [please start to hold this medication the day before the test], tamulosin or beta-blockers) the day of the exam. (Erectile dysfunction medication should be held for at least 72 hrs prior to test) Theophylline containing medications for 12 hours. Dipyridamole 48 hours prior to the test. Your remaining medications may be taken with water.  2. Nothing to eat or drink, except water, 3 hours prior to arrival time.   NO caffeine/decaffeinated products, or chocolate 12 hours prior to arrival.  3. NO perfume, cologne or lotion on chest or abdomen area.          -  FEMALES - Please avoid wearing dresses to this appointment.  4. Total time is 1 to 2 hours; you may want to bring reading material for the waiting time.  5. Please report to Radiology at the [facility] Main Entrance 30 minutes early for your test.  [address]  [place], [place] [address]  6. Please report to Radiology at [facility] Main Entrance, medical mall, 30 mins prior to your test.  [address]  [place], [place]  [phone]  Diabetic Preparation:  Hold oral medications. You may take NPH and Lantus insulin. Do not take Humalog or Humulin R (Regular Insulin) the day of your test. Check blood sugars prior to leaving the house. If able to eat breakfast prior to 3 hour fasting, you may take all medications, including your insulin, Do not worry if you miss your breakfast dose of insulin - start at your next meal. Patients who wear a continuous glucose monitor MUST remove the device prior to scanning.  IF YOU THINK YOU MAY BE PREGNANT, OR ARE NURSING PLEASE INFORM THE TECHNOLOGIST.  In preparation for your appointment, medication and supplies will be purchased.  Appointment availability is limited, so if you need to cancel or reschedule, please call the Radiology Department at [phone] [facility]) OR [phone] [facility])  24 hours in advance to avoid a cancellation fee of $100.00  What to Expect After you Arrive:  Once you arrive and check in for your appointment, you will be taken to a preparation room within the Radiology Department.  A technologist or Nurse will obtain your medical history, verify that you are correctly prepped for the exam, and explain the procedure.  Afterwards,  an IV will be started in your arm and electrodes will be placed on your skin for EKG monitoring during the stress portion of the exam. Then you will be escorted to the PET/CT scanner.  There, staff will get you positioned on the scanner and obtain a blood pressure and EKG.  During the exam, you will continue to be connected to the EKG and blood pressure machines.  A small, safe amount of a radioactive tracer will be injected in your IV to obtain a series of pictures of your heart along with an injection of a stress agent.    After  your Exam:  It is recommended that you eat a meal and drink a caffeinated beverage to counter act any effects of the stress agent.  Drink plenty of fluids for the remainder of the day and urinate frequently for the first couple of hours after the exam.  Your doctor will inform you of your test results within 7-10 business days.  For more information and frequently asked questions, please visit our website : [email or web address]  For questions about your test or how to prepare for your test, please call: Cardiac Imaging Nurse Navigators Office: [phone]

## 2022-12-06 NOTE — Telephone Encounter (Signed)
STAT call   Pt reports throbbing/stabbing CP "around my heart" 4-8/10 with associated right arm pain 4/10 with some nausea for the last 4 or 5 days, pt also reporting pain worse with deep inhalation which causes SOB  Pt reports going to ED earlier this month for same reason and was referred back to cardiology, pt reports taking her metoprolol and other meds as prescribed, pt does not have NitroStat prescribed  Pt unable to get BP but reports heart rate at 62  Pt counseled to call 911 if symptoms get worse, same day appointment made

## 2022-12-13 ENCOUNTER — Ambulatory Visit: Payer: Medicare Other

## 2022-12-21 ENCOUNTER — Encounter (HOSPITAL_COMMUNITY): Payer: Self-pay

## 2022-12-22 ENCOUNTER — Other Ambulatory Visit: Payer: Self-pay

## 2022-12-22 ENCOUNTER — Ambulatory Visit: Payer: Medicare Other | Attending: Cardiovascular Disease

## 2022-12-22 ENCOUNTER — Encounter: Payer: Self-pay | Admitting: Medical

## 2022-12-22 DIAGNOSIS — I050 Rheumatic mitral stenosis: Secondary | ICD-10-CM | POA: Diagnosis present

## 2022-12-22 DIAGNOSIS — Z5181 Encounter for therapeutic drug level monitoring: Secondary | ICD-10-CM | POA: Insufficient documentation

## 2022-12-22 DIAGNOSIS — I4891 Unspecified atrial fibrillation: Secondary | ICD-10-CM | POA: Insufficient documentation

## 2022-12-22 LAB — POCT INR: INR: 3.1 — AB (ref 2.0–3.0)

## 2022-12-22 NOTE — Patient Instructions (Signed)
continue 1 tablet daily except 1/2 tablet on Mondays and Fridays. Stay consistent with greens.  - Recheck INR in 6 weeks [phone]

## 2022-12-23 ENCOUNTER — Ambulatory Visit: Payer: 59 | Admitting: Physician Assistant

## 2022-12-23 ENCOUNTER — Ambulatory Visit
Admission: RE | Admit: 2022-12-23 | Discharge: 2022-12-23 | Disposition: A | Discharge status: Home / Self Care | Payer: Medicare Other | Source: Ambulatory Visit | Attending: Medical | Admitting: Medical

## 2022-12-23 DIAGNOSIS — R079 Chest pain, unspecified: Secondary | ICD-10-CM | POA: Diagnosis not present

## 2022-12-23 DIAGNOSIS — R918 Other nonspecific abnormal finding of lung field: Secondary | ICD-10-CM | POA: Diagnosis not present

## 2022-12-23 DIAGNOSIS — I700 Atherosclerosis of aorta: Secondary | ICD-10-CM | POA: Insufficient documentation

## 2022-12-23 DIAGNOSIS — K449 Diaphragmatic hernia without obstruction or gangrene: Secondary | ICD-10-CM | POA: Diagnosis not present

## 2022-12-23 LAB — NM PET CT CARDIAC PERFUSION MULTI W/ABSOLUTE BLOODFLOW
MBFR: 2.07
Nuc Rest EF: 73 %
Nuc Stress EF: 75 %
Peak HR: 84 {beats}/min
Rest HR: 60 {beats}/min
Rest MBF: 0.97 ml/g/min
Rest Nuclear Isotope Dose: 25 mCi
SRS: 6
SSS: 16
ST Depression (mm): 0 mm
Stress MBF: 2.01 ml/g/min
Stress Nuclear Isotope Dose: 24.9 mCi
TID: 1

## 2022-12-23 MED ORDER — REGADENOSON 0.4 MG/5ML IV SOLN
INTRAVENOUS | Status: AC
Start: 2022-12-23 — End: ?
  Filled 2022-12-23: qty 5

## 2022-12-23 MED ORDER — REGADENOSON 0.4 MG/5ML IV SOLN
0.4000 mg | Freq: Once | INTRAVENOUS | Status: AC
  Administered 2022-12-23: 0.4 mg via INTRAVENOUS
  Filled 2022-12-23: qty 5

## 2022-12-23 MED ORDER — RUBIDIUM RB82 GENERATOR (RUBYFILL)
25.0000 | PACK | Freq: Once | INTRAVENOUS | Status: AC
  Administered 2022-12-23: 24.94 via INTRAVENOUS

## 2022-12-23 MED ORDER — RUBIDIUM RB82 GENERATOR (RUBYFILL)
25.0000 | PACK | Freq: Once | INTRAVENOUS | Status: AC
  Administered 2022-12-23: 24.95 via INTRAVENOUS

## 2022-12-23 NOTE — Progress Notes (Unsigned)
Leslie [name], Leslie [name] [address]  [address]  [place], [place] [address]  Main: [phone]  Fax: [phone]   Primary Care Physician: Carlean Jews, Leslie [name]  Primary Gastroenterologist:  Leslie [name], Leslie [name] / Dr. Midge Minium    CC: Follow-up GERD, gastritis, nausea, chronic constipation  HPI: Leslie [name] is a 57 y.o. female, estab pt. Of DR. Wohl, returns for follow-up of upper abdominal pain, abdominal bloating and cramping.  History of GERD, medium hiatal hernia, and chronic gastritis.  3 months ago was switched from omeprazole to pantoprazole 40 Mg daily.  Pepcid 20 Mg twice daily and Tums as needed.  She was going to check with her PCP about holding Victoza to see if this would help her upper GI symptoms.  She was started on Linzess samples to treat chronic constipation.   05/2022 CT abdomen pelvis without contrast: showed no acute abnormality. Unchanged right kidney stone and simple kidney cyst. Small hiatal hernia.    06/2021 EGD: by Dr. Servando Snare showed medium hiatal hernia, 1 benign appearing intrinsic mild stenosis at the GE junction dilated to 18 mm.  Normal stomach and duodenum.   06/2021 Colonoscopy: showed small nonbleeding internal hemorrhoids, otherwise normal.  Good prep.  No polyps.  10-year repeat.   She has history of chronic anemia.  Last lab 09/14/2022 showed stable hemoglobin 10.7, hematocrit 33, MCV 92.  Elevated BUN 29, creatinine 1.93, GFR 30.  History of B12 deficiency and is on B12 injection through her PCP.  Normal iron studies.  Current Outpatient Medications  Medication Sig Dispense Refill   acetaminophen-codeine (TYLENOL #3) 300-30 MG tablet Take 1 tablet by mouth every 6 (six) hours as needed for moderate pain (pain score 4-6). 12 tablet 0   ALPRAZolam (XANAX) 0.5 MG tablet TAKE 1/2 TABLET BY MOUTH AT BEDTIME AS NEEDED FOR ANXIETY 30 tablet 2   amLODipine (NORVASC) 5 MG tablet Take 1 tablet (5 mg total) by mouth daily. 90 tablet 3   aspirin 81 MG  EC tablet Take 81 mg by mouth daily.     atorvastatin (LIPITOR) 80 MG tablet Take 1 tablet (80 mg total) by mouth daily. 90 tablet 3   calcium carbonate (TUMS - DOSED IN MG ELEMENTAL CALCIUM) 500 MG chewable tablet Chew 500 mg by mouth daily as needed for indigestion or heartburn.     hyoscyamine (LEVBID) 0.375 MG 12 hr tablet Take 1 tablet (0.375 mg total) by mouth 2 (two) times daily. (Patient not taking: Reported on 12/06/2022) 60 tablet 0   Insulin Pen Needle 32G X 6 MM MISC Use with victoza 100 each 3   linaclotide (LINZESS) 145 MCG CAPS capsule Take 1 capsule (145 mcg total) by mouth daily before breakfast. (Patient not taking: Reported on 12/06/2022)     losartan (COZAAR) 50 MG tablet Take 50 mg by mouth daily. (Patient not taking: Reported on 12/06/2022)     metoprolol succinate (TOPROL-XL) 100 MG 24 hr tablet Take 1 tablet (100 mg total) by mouth in the morning and at bedtime. 180 tablet 2   nitrofurantoin, macrocrystal-monohydrate, (MACROBID) 100 MG capsule Take 1 capsule (100 mg total) by mouth 2 (two) times daily. (Patient not taking: Reported on 12/06/2022) 10 capsule 0   omeprazole (PRILOSEC) 40 MG capsule TAKE 1 CAPSULE (40 MG TOTAL) BY MOUTH DAILY. 30 capsule 2   ondansetron (ZOFRAN) 8 MG tablet Take 8 mg by mouth every 8 (eight) hours as needed. (Patient not taking: Reported on 12/06/2022)  ondansetron (ZOFRAN-ODT) 4 MG disintegrating tablet Take 1 tablet (4 mg total) by mouth every 8 (eight) hours as needed for nausea or vomiting. (Patient not taking: Reported on 12/06/2022) 20 tablet 0   potassium chloride SA (KLOR-CON M20) 20 MEQ tablet TAKE 2 TABLETS BY MOUTH TWICE A DAY 120 tablet 5   spironolactone (ALDACTONE) 25 MG tablet Take 1 tablet (25 mg total) by mouth daily. 90 tablet 3   sucralfate (CARAFATE) 1 g tablet Take 1 g by mouth 2 (two) times daily.     tamsulosin (FLOMAX) 0.4 MG CAPS capsule Take 1 capsule (0.4 mg total) by mouth daily. (Patient not taking: Reported on  12/06/2022) 14 capsule 0   torsemide (DEMADEX) 20 MG tablet Take 1 tablet (20 mg total) by mouth 2 (two) times daily. 90 tablet 3   traZODone (DESYREL) 50 MG tablet TAKE 1 TABLET BY MOUTH EVERYDAY AT BEDTIME (Patient not taking: Reported on 12/06/2022) 90 tablet 1   Vitamin D, Ergocalciferol, (DRISDOL) 1.25 MG (50000 UNIT) CAPS capsule TAKE 1 CAPSULE BY MOUTH ONE TIME PER WEEK 12 capsule 1   warfarin (COUMADIN) 2 MG tablet Take 1-2 tablets Daily or as prescribed by [facility] 90 tablet 1   No current facility-administered medications for this visit.    Allergies as of 12/23/2022 - Reviewed 12/23/2022  Allergen Reaction Noted   Morphine Hives and Rash 06/06/2012   Oxycodone hcl Hives and Itching 12/26/2014   Dilaudid [hydromorphone hcl] Itching 12/26/2014    Past Medical History:  Diagnosis Date   (HFpEF) heart failure with preserved ejection fraction (HCC)    a. 08/2017 Echo: EF 55-60%.  Grade 2 diastolic dysfunction; b. 05/2020 Echo: EF 50-55%, no rwma, Nl RV fxn, nl fxn'ing mech MV.   Allergy    Anemia    Anxiety    Asthma    BRCA negative 03/22/2013   Bronchitis 02/19/2016   ON LEVAQUIN PO   Chest tightness    a. 08/2019 Cath: nl cors.   Cigarette smoker 09/11/2017   8-10 day   Dysrhythmia    GERD (gastroesophageal reflux disease)    History of kidney stones    Hyperlipidemia    Hypertension    Interstitial lung disease (HCC)    a. CT 2013 b. 02/2018 CXR noted recurrent intersistial changes ILD vs chronic bronchitis   Moderate mitral stenosis    a.  08/2017 TEE: EF 60 to 65%.  Moderate mitral stenosis.  Mean gradient 14 mmHg.  Valve area 2.59 cm by planimetry, 2.72 cm by pressure half-time.   PAF (paroxysmal atrial fibrillation) (HCC)    a. 08/2017 s/p TEE/DCCV; b. CHA2DS2VASc = 2-->warfarin.   Pneumonia    S/P Maze operation for atrial fibrillation 04/29/2020   Complete bilateral atrial lesion set using bipolar radiofrequency and cryothermy ablation with clipping of  LA appendage   S/P mitral valve replacement with Onyx bileaflet mechanical valve 04/29/2020   a. 04/2020 s/p 27/29 mm Onyx mech mitral valve-->chronic coumadin; b. 05/2020 Echo: Nl fxn'ing mech MV.    Past Surgical History:  Procedure Laterality Date   ABDOMINAL HYSTERECTOMY     total   ABDOMINAL HYSTERECTOMY     BREAST BIOPSY Bilateral 2012   BREAST BIOPSY Right 08-07-12   fibroadenomatous changes and columnar cells   BREAST BIOPSY  02/03/2015   stereo byrnett   BUBBLE STUDY  04/01/2020   Procedure: BUBBLE STUDY;  Surgeon: Parke Poisson, MD;  Location: [facility] ENDOSCOPY;  Service: Cardiovascular;;   CARDIAC CATHETERIZATION  CHOLECYSTECTOMY N/A 02/27/2016   Procedure: LAPAROSCOPIC CHOLECYSTECTOMY;  Surgeon: Kieth Brightly, MD;  Location: [facility] ORS;  Service: General;  Laterality: N/A;   CLIPPING OF ATRIAL APPENDAGE  04/29/2020   Procedure: CLIPPING OF ATRIAL APPENDAGE USING ATRICURE  PRO2 CLIP SIZE ;  Surgeon: Purcell Nails, MD;  Location: [facility] OR;  Service: Open Heart Surgery;;   COLONOSCOPY WITH PROPOFOL N/A 09/26/2018   Procedure: COLONOSCOPY WITH PROPOFOL;  Surgeon: Midge Minium, MD;  Location: [facility] ENDOSCOPY;  Service: Endoscopy;  Laterality: N/A;   COLONOSCOPY WITH PROPOFOL N/A 07/16/2021   Procedure: COLONOSCOPY WITH PROPOFOL;  Surgeon: Midge Minium, MD;  Location: [facility] ENDOSCOPY;  Service: Endoscopy;  Laterality: N/A;   CYSTOSCOPY W/ RETROGRADES Right 08/18/2018   Procedure: CYSTOSCOPY WITH RETROGRADE PYELOGRAM;  Surgeon: Sondra Come, MD;  Location: [facility] ORS;  Service: Urology;  Laterality: Right;   CYSTOSCOPY/URETEROSCOPY/HOLMIUM LASER/STENT PLACEMENT Right 08/18/2018   Procedure: CYSTOSCOPY/URETEROSCOPY/STENT PLACEMENT;  Surgeon: Sondra Come, MD;  Location: [facility] ORS;  Service: Urology;  Laterality: Right;   DIAGNOSTIC LAPAROSCOPY     ESOPHAGOGASTRODUODENOSCOPY N/A 07/16/2021   Procedure: ESOPHAGOGASTRODUODENOSCOPY (EGD);  Surgeon: Midge Minium, MD;  Location: [facility]  ENDOSCOPY;  Service: Endoscopy;  Laterality: N/A;   ESOPHAGOGASTRODUODENOSCOPY (EGD) WITH PROPOFOL N/A 09/26/2018   Procedure: ESOPHAGOGASTRODUODENOSCOPY (EGD) WITH PROPOFOL;  Surgeon: Midge Minium, MD;  Location: [facility] ENDOSCOPY;  Service: Endoscopy;  Laterality: N/A;   GIVENS CAPSULE STUDY N/A 11/03/2018   Procedure: GIVENS CAPSULE STUDY;  Surgeon: Midge Minium, MD;  Location: [facility] ENDOSCOPY;  Service: Endoscopy;  Laterality: N/A;   JOINT REPLACEMENT Left    knee   KNEE ARTHROPLASTY Right 04/09/2019   Procedure: COMPUTER ASSISTED TOTAL KNEE ARTHROPLASTY;  Surgeon: Donato Heinz, MD;  Location: [facility] ORS;  Service: Orthopedics;  Laterality: Right;   KNEE CLOSED REDUCTION Left 04/15/2015   Procedure: CLOSED MANIPULATION KNEE;  Surgeon: Juanell Fairly, MD;  Location: [facility] ORS;  Service: Orthopedics;  Laterality: Left;   KNEE SURGERY     MAZE N/A 04/29/2020   Procedure: MAZE;  Surgeon: Purcell Nails, MD;  Location: [facility] OR;  Service: Open Heart Surgery;  Laterality: N/A;   MITRAL VALVE REPLACEMENT N/A 04/29/2020   Procedure: MITRAL VALVE (MV) REPLACEMENT USING ON-X VALVE SIZE 27/29MM;  Surgeon: Purcell Nails, MD;  Location: [facility] OR;  Service: Open Heart Surgery;  Laterality: N/A;   MULTIPLE EXTRACTIONS WITH ALVEOLOPLASTY N/A 02/28/2020   Procedure: MULTIPLE EXTRACTION WITH ALVEOLOPLASTY;  Surgeon: Sharman Cheek, DMD;  Location: [facility] OR;  Service: Dentistry;  Laterality: N/A;   RIGHT/LEFT HEART CATH AND CORONARY ANGIOGRAPHY Bilateral 09/06/2019   Procedure: RIGHT/LEFT HEART CATH AND CORONARY ANGIOGRAPHY;  Surgeon: Antonieta Iba, MD;  Location: [facility] INVASIVE CV LAB;  Service: Cardiovascular;  Laterality: Bilateral;   TEE WITHOUT CARDIOVERSION N/A 09/13/2017   Procedure: TRANSESOPHAGEAL ECHOCARDIOGRAM (TEE);  Surgeon: Yvonne Kendall, MD;  Location: [facility] ORS;  Service: Cardiovascular;  Laterality: N/A;   TEE WITHOUT CARDIOVERSION N/A 04/01/2020   Procedure: TRANSESOPHAGEAL ECHOCARDIOGRAM (TEE);   Surgeon: Parke Poisson, MD;  Location: [facility] ENDOSCOPY;  Service: Cardiovascular;  Laterality: N/A;   TEE WITHOUT CARDIOVERSION N/A 04/29/2020   Procedure: TRANSESOPHAGEAL ECHOCARDIOGRAM (TEE);  Surgeon: Purcell Nails, MD;  Location: [facility] OR;  Service: Open Heart Surgery;  Laterality: N/A;   TOTAL KNEE ARTHROPLASTY Left 12/25/2014   Procedure: TOTAL KNEE ARTHROPLASTY;  Surgeon: Juanell Fairly, MD;  Location: [facility] ORS;  Service: Orthopedics;  Laterality: Left;   TUBAL LIGATION      Review of Systems:  All systems reviewed and negative except where noted in HPI.   Physical Examination:   There were no vitals taken for this visit.  General: Well-nourished, well-developed in no acute distress.  Lungs: Clear to auscultation bilaterally. Non-labored. Heart: Regular rate and rhythm, no murmurs rubs or gallops.  Abdomen: Bowel sounds are normal; Abdomen is Soft; No hepatosplenomegaly, masses or hernias;  No Abdominal Tenderness; No guarding or rebound tenderness. Neuro: Alert and oriented x 3.  Grossly intact.  Psych: Alert and cooperative, normal mood and affect.   Imaging Studies: No results found.  Assessment and Plan:   Leslie [name] is a 57 y.o. y/o female ***    Leslie [name], Leslie [name]  Follow up ***  BP check ***

## 2022-12-23 NOTE — Progress Notes (Signed)
Patient presents for a cardiac PET stress test and tolerated procedure with some chest discomfort and shortness of breath that resolved by the conclusion of the test. Patient maintained acceptable vital signs throughout the test and was offered caffeine after test.  Patient ambulated out of department with a steady gait.

## 2022-12-27 ENCOUNTER — Telehealth: Payer: Self-pay | Admitting: Physician Assistant

## 2022-12-27 NOTE — Telephone Encounter (Signed)
Home SS appointment 01/25/23 @ [facility]

## 2022-12-28 ENCOUNTER — Other Ambulatory Visit: Payer: Self-pay | Admitting: Nurse Practitioner

## 2022-12-28 ENCOUNTER — Ambulatory Visit: Payer: Medicare Other | Admitting: Internal Medicine

## 2022-12-28 ENCOUNTER — Encounter: Payer: Self-pay | Admitting: Internal Medicine

## 2022-12-28 VITALS — BP 120/80 | HR 89 | Temp 98.5°F | Resp 16 | Ht 64.0 in | Wt 249.2 lb

## 2022-12-28 DIAGNOSIS — N390 Urinary tract infection, site not specified: Secondary | ICD-10-CM | POA: Diagnosis not present

## 2022-12-28 DIAGNOSIS — E1165 Type 2 diabetes mellitus with hyperglycemia: Secondary | ICD-10-CM | POA: Diagnosis not present

## 2022-12-28 DIAGNOSIS — N184 Chronic kidney disease, stage 4 (severe): Secondary | ICD-10-CM

## 2022-12-28 DIAGNOSIS — I480 Paroxysmal atrial fibrillation: Secondary | ICD-10-CM

## 2022-12-28 DIAGNOSIS — K5909 Other constipation: Secondary | ICD-10-CM | POA: Diagnosis not present

## 2022-12-28 DIAGNOSIS — I129 Hypertensive chronic kidney disease with stage 1 through stage 4 chronic kidney disease, or unspecified chronic kidney disease: Secondary | ICD-10-CM

## 2022-12-28 DIAGNOSIS — R300 Dysuria: Secondary | ICD-10-CM | POA: Diagnosis not present

## 2022-12-28 DIAGNOSIS — R319 Hematuria, unspecified: Secondary | ICD-10-CM

## 2022-12-28 LAB — POCT GLYCOSYLATED HEMOGLOBIN (HGB A1C): Hemoglobin A1C: 5.9 % — AB (ref 4.0–5.6)

## 2022-12-28 LAB — POCT URINALYSIS DIPSTICK
Bilirubin, UA: NEGATIVE
Glucose, UA: NEGATIVE
Nitrite, UA: NEGATIVE
Protein, UA: POSITIVE — AB
Spec Grav, UA: 1.01 (ref 1.010–1.025)
Urobilinogen, UA: 0.2 U/dL
pH, UA: 6 (ref 5.0–8.0)

## 2022-12-28 LAB — GLUCOSE, POCT (MANUAL RESULT ENTRY): POC Glucose: 136 mg/dL — AB (ref 70–99)

## 2022-12-28 MED ORDER — FLUCONAZOLE 150 MG PO TABS: ORAL_TABLET | ORAL | 0 refills | Status: DC

## 2022-12-28 MED ORDER — NITROFURANTOIN MONOHYD MACRO 100 MG PO CAPS: ORAL_CAPSULE | ORAL | 0 refills | Status: DC

## 2022-12-28 MED ORDER — BISACODYL 5 MG PO TBEC: DELAYED_RELEASE_TABLET | ORAL | 6 refills | Status: DC

## 2022-12-28 NOTE — Progress Notes (Signed)
[facility] [address] [place], [place] [address]  Internal MEDICINE  Office Visit Note  Patient Name: Leslie [name]  [id number]  [id number]  Date of Service: 02/03/2023  Chief Complaint  Patient presents with   Follow-up   Gastroesophageal Reflux   Hypertension   Hyperlipidemia   Urinary Tract Infection    HPI  Pt is seen for acute visit C/o feeling bloated, with abdominal pain, she has multiple ED visits for this and had CT scan done as well with no pathology  She is constipated most of the time  C/O dysuria as well  Hg A1c is within good limits ( Victoza?? )  Does have CKD due to hypertension    Current Medication: Outpatient Encounter Medications as of 12/28/2022  Medication Sig   ALPRAZolam (XANAX) 0.5 MG tablet TAKE 1/2 TABLET BY MOUTH AT BEDTIME AS NEEDED FOR ANXIETY   amLODipine (NORVASC) 5 MG tablet Take 1 tablet (5 mg total) by mouth daily.   aspirin 81 MG EC tablet Take 81 mg by mouth daily.   atorvastatin (LIPITOR) 80 MG tablet Take 1 tablet (80 mg total) by mouth daily.   bisacodyl (DULCOLAX) 5 MG EC tablet One tab at night Monday wed and friday   calcium carbonate (TUMS - DOSED IN MG ELEMENTAL CALCIUM) 500 MG chewable tablet Chew 500 mg by mouth daily as needed for indigestion or heartburn.   hyoscyamine (LEVBID) 0.375 MG 12 hr tablet Take 1 tablet (0.375 mg total) by mouth 2 (two) times daily.   Insulin Pen Needle 32G X 6 MM MISC Use with victoza   linaclotide (LINZESS) 145 MCG CAPS capsule Take 1 capsule (145 mcg total) by mouth daily before breakfast.   losartan (COZAAR) 50 MG tablet Take 50 mg by mouth daily.   metoprolol succinate (TOPROL-XL) 100 MG 24 hr tablet Take 1 tablet (100 mg total) by mouth in the morning and at bedtime.   nitrofurantoin, macrocrystal-monohydrate, (MACROBID) 100 MG capsule Take one tab po bid   ondansetron (ZOFRAN) 8 MG tablet Take 8 mg by mouth every 8 (eight) hours as needed.   ondansetron (ZOFRAN-ODT) 4 MG  disintegrating tablet Take 1 tablet (4 mg total) by mouth every 8 (eight) hours as needed for nausea or vomiting.   potassium chloride SA (KLOR-CON M20) 20 MEQ tablet TAKE 2 TABLETS BY MOUTH TWICE A DAY   spironolactone (ALDACTONE) 25 MG tablet Take 1 tablet (25 mg total) by mouth daily.   torsemide (DEMADEX) 20 MG tablet Take 1 tablet (20 mg total) by mouth 2 (two) times daily.   traZODone (DESYREL) 50 MG tablet TAKE 1 TABLET BY MOUTH EVERYDAY AT BEDTIME   warfarin (COUMADIN) 2 MG tablet Take 1-2 tablets Daily or as prescribed by [facility]   [DISCONTINUED] acetaminophen-codeine (TYLENOL #3) 300-30 MG tablet Take 1 tablet by mouth every 6 (six) hours as needed for moderate pain (pain score 4-6).   [DISCONTINUED] fluconazole (DIFLUCAN) 150 MG tablet One tab a day as needed after antibiotics   [DISCONTINUED] nitrofurantoin, macrocrystal-monohydrate, (MACROBID) 100 MG capsule Take 1 capsule (100 mg total) by mouth 2 (two) times daily.   [DISCONTINUED] omeprazole (PRILOSEC) 40 MG capsule TAKE 1 CAPSULE (40 MG TOTAL) BY MOUTH DAILY.   [DISCONTINUED] sucralfate (CARAFATE) 1 g tablet Take 1 g by mouth 2 (two) times daily.   [DISCONTINUED] tamsulosin (FLOMAX) 0.4 MG CAPS capsule Take 1 capsule (0.4 mg total) by mouth daily.   [DISCONTINUED] Vitamin D, Ergocalciferol, (DRISDOL) 1.25 MG (50000 UNIT)  CAPS capsule TAKE 1 CAPSULE BY MOUTH ONE TIME PER WEEK   No facility-administered encounter medications on file as of 12/28/2022.    Surgical History: Past Surgical History:  Procedure Laterality Date   ABDOMINAL HYSTERECTOMY     total   ABDOMINAL HYSTERECTOMY     BREAST BIOPSY Bilateral 2012   BREAST BIOPSY Right 08-07-12   fibroadenomatous changes and columnar cells   BREAST BIOPSY  02/03/2015   stereo byrnett   BUBBLE STUDY  04/01/2020   Procedure: BUBBLE STUDY;  Surgeon: Parke Poisson, MD;  Location: [facility] ENDOSCOPY;  Service: Cardiovascular;;   CARDIAC CATHETERIZATION     CHOLECYSTECTOMY  N/A 02/27/2016   Procedure: LAPAROSCOPIC CHOLECYSTECTOMY;  Surgeon: Kieth Brightly, MD;  Location: [facility] ORS;  Service: General;  Laterality: N/A;   CLIPPING OF ATRIAL APPENDAGE  04/29/2020   Procedure: CLIPPING OF ATRIAL APPENDAGE USING ATRICURE  PRO2 CLIP SIZE ;  Surgeon: Purcell Nails, MD;  Location: [facility] OR;  Service: Open Heart Surgery;;   COLONOSCOPY WITH PROPOFOL N/A 09/26/2018   Procedure: COLONOSCOPY WITH PROPOFOL;  Surgeon: Midge Minium, MD;  Location: [facility] ENDOSCOPY;  Service: Endoscopy;  Laterality: N/A;   COLONOSCOPY WITH PROPOFOL N/A 07/16/2021   Procedure: COLONOSCOPY WITH PROPOFOL;  Surgeon: Midge Minium, MD;  Location: [facility] ENDOSCOPY;  Service: Endoscopy;  Laterality: N/A;   CYSTOSCOPY W/ RETROGRADES Right 08/18/2018   Procedure: CYSTOSCOPY WITH RETROGRADE PYELOGRAM;  Surgeon: Sondra Come, MD;  Location: [facility] ORS;  Service: Urology;  Laterality: Right;   CYSTOSCOPY/URETEROSCOPY/HOLMIUM LASER/STENT PLACEMENT Right 08/18/2018   Procedure: CYSTOSCOPY/URETEROSCOPY/STENT PLACEMENT;  Surgeon: Sondra Come, MD;  Location: [facility] ORS;  Service: Urology;  Laterality: Right;   DIAGNOSTIC LAPAROSCOPY     ESOPHAGOGASTRODUODENOSCOPY N/A 07/16/2021   Procedure: ESOPHAGOGASTRODUODENOSCOPY (EGD);  Surgeon: Midge Minium, MD;  Location: [facility] ENDOSCOPY;  Service: Endoscopy;  Laterality: N/A;   ESOPHAGOGASTRODUODENOSCOPY (EGD) WITH PROPOFOL N/A 09/26/2018   Procedure: ESOPHAGOGASTRODUODENOSCOPY (EGD) WITH PROPOFOL;  Surgeon: Midge Minium, MD;  Location: [facility] ENDOSCOPY;  Service: Endoscopy;  Laterality: N/A;   GIVENS CAPSULE STUDY N/A 11/03/2018   Procedure: GIVENS CAPSULE STUDY;  Surgeon: Midge Minium, MD;  Location: [facility] ENDOSCOPY;  Service: Endoscopy;  Laterality: N/A;   JOINT REPLACEMENT Left    knee   KNEE ARTHROPLASTY Right 04/09/2019   Procedure: COMPUTER ASSISTED TOTAL KNEE ARTHROPLASTY;  Surgeon: Donato Heinz, MD;  Location: [facility] ORS;  Service: Orthopedics;  Laterality: Right;    KNEE CLOSED REDUCTION Left 04/15/2015   Procedure: CLOSED MANIPULATION KNEE;  Surgeon: Juanell Fairly, MD;  Location: [facility] ORS;  Service: Orthopedics;  Laterality: Left;   KNEE SURGERY     MAZE N/A 04/29/2020   Procedure: MAZE;  Surgeon: Purcell Nails, MD;  Location: [facility] OR;  Service: Open Heart Surgery;  Laterality: N/A;   MITRAL VALVE REPLACEMENT N/A 04/29/2020   Procedure: MITRAL VALVE (MV) REPLACEMENT USING ON-X VALVE SIZE 27/29MM;  Surgeon: Purcell Nails, MD;  Location: [facility] OR;  Service: Open Heart Surgery;  Laterality: N/A;   MULTIPLE EXTRACTIONS WITH ALVEOLOPLASTY N/A 02/28/2020   Procedure: MULTIPLE EXTRACTION WITH ALVEOLOPLASTY;  Surgeon: Sharman Cheek, DMD;  Location: [facility] OR;  Service: Dentistry;  Laterality: N/A;   RIGHT/LEFT HEART CATH AND CORONARY ANGIOGRAPHY Bilateral 09/06/2019   Procedure: RIGHT/LEFT HEART CATH AND CORONARY ANGIOGRAPHY;  Surgeon: Antonieta Iba, MD;  Location: [facility] INVASIVE CV LAB;  Service: Cardiovascular;  Laterality: Bilateral;   TEE WITHOUT CARDIOVERSION N/A 09/13/2017   Procedure: TRANSESOPHAGEAL ECHOCARDIOGRAM (TEE);  Surgeon: Yvonne Kendall, MD;  Location: [facility] ORS;  Service: Cardiovascular;  Laterality: N/A;   TEE WITHOUT CARDIOVERSION N/A 04/01/2020   Procedure: TRANSESOPHAGEAL ECHOCARDIOGRAM (TEE);  Surgeon: Parke Poisson, MD;  Location: [facility] ENDOSCOPY;  Service: Cardiovascular;  Laterality: N/A;   TEE WITHOUT CARDIOVERSION N/A 04/29/2020   Procedure: TRANSESOPHAGEAL ECHOCARDIOGRAM (TEE);  Surgeon: Purcell Nails, MD;  Location: [facility] OR;  Service: Open Heart Surgery;  Laterality: N/A;   TOTAL KNEE ARTHROPLASTY Left 12/25/2014   Procedure: TOTAL KNEE ARTHROPLASTY;  Surgeon: Juanell Fairly, MD;  Location: [facility] ORS;  Service: Orthopedics;  Laterality: Left;   TUBAL LIGATION      Medical History: Past Medical History:  Diagnosis Date   (HFpEF) heart failure with preserved ejection fraction (HCC)    a. 08/2017 Echo: EF 55-60%.  Grade 2 diastolic  dysfunction; b. 05/2020 Echo: EF 50-55%, no rwma, Nl RV fxn, nl fxn'ing mech MV.   Allergy    Anemia    Anxiety    Asthma    BRCA negative 03/22/2013   Bronchitis 02/19/2016   ON LEVAQUIN PO   Chest tightness    a. 08/2019 Cath: nl cors.   Cigarette smoker 09/11/2017   8-10 day   Dysrhythmia    GERD (gastroesophageal reflux disease)    History of kidney stones    Hyperlipidemia    Hypertension    Interstitial lung disease (HCC)    a. CT 2013 b. 02/2018 CXR noted recurrent intersistial changes ILD vs chronic bronchitis   Moderate mitral stenosis    a.  08/2017 TEE: EF 60 to 65%.  Moderate mitral stenosis.  Mean gradient 14 mmHg.  Valve area 2.59 cm by planimetry, 2.72 cm by pressure half-time.   PAF (paroxysmal atrial fibrillation) (HCC)    a. 08/2017 s/p TEE/DCCV; b. CHA2DS2VASc = 2-->warfarin.   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 04/2020 s/p 27/29 mm Onyx mech mitral valve-->chronic coumadin; b. 05/2020 Echo: Nl fxn'ing mech MV.    Family History: Family History  Problem Relation Age of Onset   Breast cancer Mother    Cancer Mother 29       breast   Hypertension Mother    Osteoarthritis Father    Hypertension Father    Cancer Maternal Aunt        breast   Breast cancer Maternal Grandmother    Cancer Maternal Grandmother        breast    Social History   Socioeconomic History   Marital status: Married    Spouse name: Not on file   Number of children: Not on file   Years of education: Not on file   Highest education level: Not on file  Occupational History   Not on file  Tobacco Use   Smoking status: Former    Current packs/day: 0.00    Average packs/day: 0.5 packs/day for 20.0 years (10.0 ttl pk-yrs)    Types: Cigarettes     Start date: 01/1996    Quit date: 01/2016    Years since quitting: 7.0   Smokeless tobacco: Never  Vaping Use   Vaping status: Never Used  Substance and Sexual Activity   Alcohol use: Not Currently    Comment: rarely   Drug use: No   Sexual activity: Not on file  Other Topics Concern  Not on file  Social History Narrative   Not on file   Social Drivers of Health   Financial Resource Strain: Low Risk  (05/07/2020)   Overall Financial Resource Strain (CARDIA)    Difficulty of Paying Living Expenses: Not very hard  Food Insecurity: Unknown (11/16/2021)   Hunger Vital Sign    Worried About Running Out of Food in the Last Year: Not on file    Ran Out of Food in the Last Year: Never true  Transportation Needs: No Transportation Needs (11/16/2021)   PRAPARE - Administrator, Civil Service (Medical): No    Lack of Transportation (Non-Medical): No  Physical Activity: Not on file  Stress: Not on file  Social Connections: Not on file  Intimate Partner Violence: Not At Risk (11/16/2021)   Humiliation, Afraid, Rape, and Kick questionnaire    Fear of Current or Ex-Partner: No    Emotionally Abused: No    Physically Abused: No    Sexually Abused: No      Review of Systems  Vital Signs: BP 120/80   Pulse 89   Temp 98.5 F (36.9 C)   Resp 16   Ht 5\' 4"  (1.626 m)   Wt 249 lb 3.2 oz (113 kg)   SpO2 96%   BMI 42.78 kg/m    Physical Exam Constitutional:      Appearance: Normal appearance.  HENT:     Head: Normocephalic and atraumatic.     Nose: Nose normal.     Mouth/Throat:     Mouth: Mucous membranes are moist.     Pharynx: No posterior oropharyngeal erythema.  Eyes:     Extraocular Movements: Extraocular movements intact.     Pupils: Pupils are equal, round, and reactive to light.  Cardiovascular:     Pulses: Normal pulses.     Heart sounds: Normal heart sounds.  Pulmonary:     Effort: Pulmonary effort is normal.     Breath sounds: Normal breath  sounds.  Neurological:     General: No focal deficit present.     Mental Status: She is alert.  Psychiatric:        Mood and Affect: Mood normal.        Behavior: Behavior normal.        Assessment/Plan: 1. Other constipation (Primary) Pt is on Linzess however this is not working, Add Dulcolax, increase water intake and fiber   2. Urinary tract infection with hematuria, site unspecified No actual bacterial growth seen on previous cx, however has significant  - CULTURE, URINE COMPREHENSIVE History of right renal calculus 6 mm in December 2020 -Underwent cystoscopy and ureteroscopy with lithotripsy and indwelling stent on February 08, 2019 at [facility]. Urinary stent removed on February 16, 2019 -Last urology visit in March 27, 2020. CT abdomen showed 1 cm calcification in right upper pole unchanged. Conservative management was recommended. CT abdomen 05/2022- nonobstructing right renal stone. Unchanged. CT abdomen 09/14/2022-stable nonobstructive right renal stone.   3. Type 2 diabetes mellitus with hyperglycemia, without long-term current use of insulin (HCC) Pt has been on Victoza, hg aic is 5.9, will monitor , might need SGLT1,  H/O of candidiasis  - POCT HgB A1C - POCT Glucose (CBG)  4. Paroxysmal atrial fibrillation [facility]) -Follow-up with cardiology and CT surgeon -Status post maze procedure April 2022 status post mitral valve replacement with Onyx bileaflet mechanical valve On April 29, 2020 Anticoagulation with warfarin   5. Hypertensive kidney disease with chronic kidney disease stage IV (  HCC) Improved renal functions on Demadex for volume control. Current diuretic regimen includes Torsemide 20 mg twice a day, spironolactone 25 mg daily Amlodipine may be contributing to lower extremity edema also. Previously recommended to obtain sleep study. -Continue losartan 50 mg p.o. daily.   General Counseling: Samhitha verbalizes understanding of the findings of todays visit and agrees  with plan of treatment. I have discussed any further diagnostic evaluation that may be needed or ordered today. We also reviewed her medications today. she has been encouraged to call the office with any questions or concerns that should arise related to todays visit.    Orders Placed This Encounter  Procedures   CULTURE, URINE COMPREHENSIVE   POCT Urinalysis Dipstick   POCT HgB A1C   POCT Glucose (CBG)    Meds ordered this encounter  Medications   bisacodyl (DULCOLAX) 5 MG EC tablet    Sig: One tab at night Monday wed and friday    Dispense:  30 tablet    Refill:  6   nitrofurantoin, macrocrystal-monohydrate, (MACROBID) 100 MG capsule    Sig: Take one tab po bid    Dispense:  20 capsule    Refill:  0   DISCONTD: fluconazole (DIFLUCAN) 150 MG tablet    Sig: One tab a day as needed after antibiotics    Dispense:  3 tablet    Refill:  0    Total time spent:45 Minutes Time spent includes review of chart, medications, test results, and follow up plan with the patient.   [place] Controlled Substance Database was reviewed by me.   Dr Lyndon Code Internal medicine

## 2022-12-29 ENCOUNTER — Ambulatory Visit: Payer: Medicare Other

## 2023-01-03 LAB — CULTURE, URINE COMPREHENSIVE

## 2023-01-04 ENCOUNTER — Telehealth: Payer: Self-pay

## 2023-01-04 ENCOUNTER — Other Ambulatory Visit: Payer: Self-pay

## 2023-01-04 MED ORDER — ESTROGENS CONJUGATED 0.625 MG/GM VA CREA: TOPICAL_CREAM | VAGINAL | 3 refills | Status: DC

## 2023-01-04 MED ORDER — ESTRADIOL 0.1 MG/GM VA CREA: TOPICAL_CREAM | VAGINAL | 0 refills | Status: DC

## 2023-01-04 MED ORDER — FLUCONAZOLE 150 MG PO TABS: ORAL_TABLET | ORAL | 0 refills | Status: DC

## 2023-01-04 NOTE — Telephone Encounter (Signed)
Change med to estrace as per dfk  and also advised she can discuss with lauren at next visit

## 2023-01-04 NOTE — Telephone Encounter (Signed)
pt called that antibiotic not helping and still having burning and itching as per dr advised pt her u/a is growing nothing like 5000 colonies and mixed flora and also send  as per DFK more diflucan 150 ng po qweek x 4 no refills and premarin vag cream apply 1/2 applicator once a week # one tube with 3 refills and pt advised that we sent med

## 2023-01-04 NOTE — Progress Notes (Signed)
FYI No significant bacterial growth on urine cultures, however pt does need to practice better Hygiene  If continues to have symptoms, might need hormone replacement tehrapy or see Gyn

## 2023-01-07 ENCOUNTER — Ambulatory Visit: Payer: Medicare Other | Attending: Medical | Admitting: Medical

## 2023-01-07 ENCOUNTER — Encounter: Payer: Self-pay | Admitting: Medical

## 2023-01-07 VITALS — BP 130/52 | HR 55 | Ht 64.0 in | Wt 246.6 lb

## 2023-01-07 DIAGNOSIS — Z954 Presence of other heart-valve replacement: Secondary | ICD-10-CM | POA: Diagnosis present

## 2023-01-07 DIAGNOSIS — I4891 Unspecified atrial fibrillation: Secondary | ICD-10-CM

## 2023-01-07 DIAGNOSIS — Z79899 Other long term (current) drug therapy: Secondary | ICD-10-CM

## 2023-01-07 DIAGNOSIS — I5032 Chronic diastolic (congestive) heart failure: Secondary | ICD-10-CM

## 2023-01-07 DIAGNOSIS — R5383 Other fatigue: Secondary | ICD-10-CM | POA: Diagnosis present

## 2023-01-07 NOTE — Patient Instructions (Signed)
Medication Instructions:   Your Physician recommend you continue on your current medication as directed.     *If you need a refill on your cardiac medications before your next appointment, please call your pharmacy*   Lab Work: Your provider would like for you to have following labs drawn today BMP and CBC.   If you have labs (blood work) drawn today and your tests are completely normal, you will receive your results only by: MyChart Message (if you have MyChart) OR A paper copy in the mail If you have any lab test that is abnormal or we need to change your treatment, we will call you to review the results.   Testing/Procedures: None ordered.   Follow-Up: At [facility], you and your health needs are our priority.  As part of our continuing mission to provide you with exceptional heart care, we have created designated Provider Care Teams.  These Care Teams include your primary Cardiologist (physician) and Advanced Practice Providers (APPs -  Physician Assistants and Nurse Practitioners) who all work together to provide you with the care you need, when you need it.  We recommend signing up for the patient portal called "MyChart".  Sign up information is provided on this After Visit Summary.  MyChart is used to connect with patients for Virtual Visits (Telemedicine).  Patients are able to view lab/test results, encounter notes, upcoming appointments, etc.  Non-urgent messages can be sent to your provider as well.   To learn more about what you can do with MyChart, go to [email or web address].    Your next appointment:   3 month(s)  Provider:   You may see Julien Nordmann, MD or one of the following Advanced Practice Providers on your designated Care Team:   Nicolasa Ducking, NP Eula Listen, PA-C Cadence Fransico Michael, PA-C Charlsie Quest, NP Carlos Levering, NP

## 2023-01-07 NOTE — Progress Notes (Signed)
Cardiology Office Note:    Date:  01/07/2023   ID:  Leslie [name], DOB [birth year], MRN [id number]  PCP:  Carlean Jews, PA-C  [facility] Cardiologist:  Julien Nordmann, MD  [facility] Electrophysiologist:  None   Referring MD: Carlean Jews, PA*   Chief Complaint: 1 month follow-up  History of Present Illness:    Leslie [name] is a 57 y.o. female with a hx of  mitral valve stenosis s/p MVR with concomitant MAZE procedure, morbid obesity, HLD, COPD who presents for chest pain.    The patient was hospitalized in 08/2017 for chest tightness. She was found to be in afib RVR and underwent TEE that showed moderate mitral stenosis and she was cardioverted. Cardiac CTA  in 2020 for chest tightness showed nonobstructive CAD. She had progressive mitral stenosis on serial echocardiograms with severe mitral stenosis on echo in 06/2019. R/L heart cath showed moderate pulmonary HTN with no significant coronary disease. Patient was referred to Dr. Cornelius Moras of CT surgery and and she underwent mitral valve replacement with ON-X bileaflet mechanical valve with MAZE procedure in 04/2020. She was started on coumadin post-procedure. Follow-up echo 05/2020 showed EF 50-55% with no MV regurgitation. Heart monitor showed NSR, average HR 77bpm, 7 runs of SVT, longest lasting 6 beats, no afib.    Echo in 2023 showed well functioning valve.    She was seen in the ER 11/4 for chest pain. BP Was soft. She was tender in the right upper abdomen. HS trop was negative x2. Work-up was overall unremarkable.   The patient was last seen 12/06/22 and reporting chest and back pain. PET stress test and echo were ordered. Stress test showed small perfusion defect related to apical thinning, cannot exclude mild apical ischemia. Defect 1: small defect with mild reduction in uptake present in the apical anterior and lateral location that is reversible. There is normal wall motion, consistent with artifact. End diastolic  cavity was normal. Coronary calcium present on attenuation correction CT images, mild coronary calcifications present, mitral valve replacement.  Today, the stress test was reviewed. She had to re-schedule the echo due to having GI bug with nausea, vomiting, fever and chills. She was treated last week for a UTI. She has been feeling tired with low energy. She reports cramps all over. She has some shortness of breath. She was nauseous and sick a few weeks ago. She has minimal chest pain and feels she may be coming down with a cold. She has cough and chills. She denies lower leg edema.    Past Medical History:  Diagnosis Date   (HFpEF) heart failure with preserved ejection fraction (HCC)    a. 08/2017 Echo: EF 55-60%.  Grade 2 diastolic dysfunction; b. 05/2020 Echo: EF 50-55%, no rwma, Nl RV fxn, nl fxn'ing mech MV.   Allergy    Anemia    Anxiety    Asthma    BRCA negative 03/22/2013   Bronchitis 02/19/2016   ON LEVAQUIN PO   Chest tightness    a. 08/2019 Cath: nl cors.   Cigarette smoker 09/11/2017   8-10 day   Dysrhythmia    GERD (gastroesophageal reflux disease)    History of kidney stones    Hyperlipidemia    Hypertension    Interstitial lung disease (HCC)    a. CT 2013 b. 02/2018 CXR noted recurrent intersistial changes ILD vs chronic bronchitis   Moderate mitral stenosis    a.  08/2017 TEE: EF 60 to 65%.  Moderate mitral stenosis.  Mean gradient 14 mmHg.  Valve area 2.59 cm by planimetry, 2.72 cm by pressure half-time.   PAF (paroxysmal atrial fibrillation) (HCC)    a. 08/2017 s/p TEE/DCCV; b. CHA2DS2VASc = 2-->warfarin.   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 04/2020 s/p 27/29 mm Onyx mech mitral valve-->chronic coumadin; b. 05/2020 Echo: Nl fxn'ing  mech MV.    Past Surgical History:  Procedure Laterality Date   ABDOMINAL HYSTERECTOMY     total   ABDOMINAL HYSTERECTOMY     BREAST BIOPSY Bilateral 2012   BREAST BIOPSY Right 08-07-12   fibroadenomatous changes and columnar cells   BREAST BIOPSY  02/03/2015   stereo byrnett   BUBBLE STUDY  04/01/2020   Procedure: BUBBLE STUDY;  Surgeon: Parke Poisson, MD;  Location: [facility];  Service: Cardiovascular;;   CARDIAC CATHETERIZATION     CHOLECYSTECTOMY N/A 02/27/2016   Procedure: LAPAROSCOPIC CHOLECYSTECTOMY;  Surgeon: Kieth Brightly, MD;  Location: [facility] ORS;  Service: General;  Laterality: N/A;   CLIPPING OF ATRIAL APPENDAGE  04/29/2020   Procedure: CLIPPING OF ATRIAL APPENDAGE USING ATRICURE  PRO2 CLIP SIZE ;  Surgeon: Purcell Nails, MD;  Location: [facility] OR;  Service: Open Heart Surgery;;   COLONOSCOPY WITH PROPOFOL N/A 09/26/2018   Procedure: COLONOSCOPY WITH PROPOFOL;  Surgeon: Midge Minium, MD;  Location: [facility] ENDOSCOPY;  Service: Endoscopy;  Laterality: N/A;   COLONOSCOPY WITH PROPOFOL N/A 07/16/2021   Procedure: COLONOSCOPY WITH PROPOFOL;  Surgeon: Midge Minium, MD;  Location: [facility] ENDOSCOPY;  Service: Endoscopy;  Laterality: N/A;   CYSTOSCOPY W/ RETROGRADES Right 08/18/2018   Procedure: CYSTOSCOPY WITH RETROGRADE PYELOGRAM;  Surgeon: Sondra Come, MD;  Location: [facility] ORS;  Service: Urology;  Laterality: Right;   CYSTOSCOPY/URETEROSCOPY/HOLMIUM LASER/STENT PLACEMENT Right 08/18/2018   Procedure: CYSTOSCOPY/URETEROSCOPY/STENT PLACEMENT;  Surgeon: Sondra Come, MD;  Location: [facility] ORS;  Service: Urology;  Laterality: Right;   DIAGNOSTIC LAPAROSCOPY     ESOPHAGOGASTRODUODENOSCOPY N/A 07/16/2021   Procedure: ESOPHAGOGASTRODUODENOSCOPY (EGD);  Surgeon: Midge Minium, MD;  Location: [facility] ENDOSCOPY;  Service: Endoscopy;  Laterality: N/A;   ESOPHAGOGASTRODUODENOSCOPY (EGD) WITH PROPOFOL N/A 09/26/2018   Procedure: ESOPHAGOGASTRODUODENOSCOPY (EGD) WITH PROPOFOL;  Surgeon:  Midge Minium, MD;  Location: [facility] ENDOSCOPY;  Service: Endoscopy;  Laterality: N/A;   GIVENS CAPSULE STUDY N/A 11/03/2018   Procedure: GIVENS CAPSULE STUDY;  Surgeon: Midge Minium, MD;  Location: [facility] ENDOSCOPY;  Service: Endoscopy;  Laterality: N/A;   JOINT REPLACEMENT Left    knee   KNEE ARTHROPLASTY Right 04/09/2019   Procedure: COMPUTER ASSISTED TOTAL KNEE ARTHROPLASTY;  Surgeon: Donato Heinz, MD;  Location: [facility] ORS;  Service: Orthopedics;  Laterality: Right;   KNEE CLOSED REDUCTION Left 04/15/2015   Procedure: CLOSED MANIPULATION KNEE;  Surgeon: Juanell Fairly, MD;  Location: [facility] ORS;  Service: Orthopedics;  Laterality: Left;   KNEE SURGERY     MAZE N/A 04/29/2020   Procedure: MAZE;  Surgeon: Purcell Nails, MD;  Location: [facility] OR;  Service: Open Heart Surgery;  Laterality: N/A;   MITRAL VALVE REPLACEMENT N/A 04/29/2020   Procedure: MITRAL VALVE (MV) REPLACEMENT USING ON-X VALVE SIZE 27/29MM;  Surgeon: Purcell Nails, MD;  Location: [facility] OR;  Service: Open Heart Surgery;  Laterality: N/A;   MULTIPLE EXTRACTIONS WITH ALVEOLOPLASTY N/A 02/28/2020   Procedure:  MULTIPLE EXTRACTION WITH ALVEOLOPLASTY;  Surgeon: Sharman Cheek, DMD;  Location: [facility] OR;  Service: Dentistry;  Laterality: N/A;   RIGHT/LEFT HEART CATH AND CORONARY ANGIOGRAPHY Bilateral 09/06/2019   Procedure: RIGHT/LEFT HEART CATH AND CORONARY ANGIOGRAPHY;  Surgeon: Antonieta Iba, MD;  Location: [facility] INVASIVE CV LAB;  Service: Cardiovascular;  Laterality: Bilateral;   TEE WITHOUT CARDIOVERSION N/A 09/13/2017   Procedure: TRANSESOPHAGEAL ECHOCARDIOGRAM (TEE);  Surgeon: Yvonne Kendall, MD;  Location: [facility] ORS;  Service: Cardiovascular;  Laterality: N/A;   TEE WITHOUT CARDIOVERSION N/A 04/01/2020   Procedure: TRANSESOPHAGEAL ECHOCARDIOGRAM (TEE);  Surgeon: Parke Poisson, MD;  Location: [facility] ENDOSCOPY;  Service: Cardiovascular;  Laterality: N/A;   TEE WITHOUT CARDIOVERSION N/A 04/29/2020   Procedure: TRANSESOPHAGEAL  ECHOCARDIOGRAM (TEE);  Surgeon: Purcell Nails, MD;  Location: [facility] OR;  Service: Open Heart Surgery;  Laterality: N/A;   TOTAL KNEE ARTHROPLASTY Left 12/25/2014   Procedure: TOTAL KNEE ARTHROPLASTY;  Surgeon: Juanell Fairly, MD;  Location: [facility] ORS;  Service: Orthopedics;  Laterality: Left;   TUBAL LIGATION      Current Medications: Current Meds  Medication Sig   ALPRAZolam (XANAX) 0.5 MG tablet TAKE 1/2 TABLET BY MOUTH AT BEDTIME AS NEEDED FOR ANXIETY   amLODipine (NORVASC) 5 MG tablet Take 1 tablet (5 mg total) by mouth daily.   aspirin 81 MG EC tablet Take 81 mg by mouth daily.   atorvastatin (LIPITOR) 80 MG tablet Take 1 tablet (80 mg total) by mouth daily.   bisacodyl (DULCOLAX) 5 MG EC tablet One tab at night Monday wed and friday   calcium carbonate (TUMS - DOSED IN MG ELEMENTAL CALCIUM) 500 MG chewable tablet Chew 500 mg by mouth daily as needed for indigestion or heartburn.   estradiol (ESTRACE) 0.1 MG/GM vaginal cream 1/2 applicator once a week   fluconazole (DIFLUCAN) 150 MG tablet Use as directed once a week for 4 weeks   hyoscyamine (LEVBID) 0.375 MG 12 hr tablet Take 1 tablet (0.375 mg total) by mouth 2 (two) times daily.   Insulin Pen Needle 32G X 6 MM MISC Use with victoza   linaclotide (LINZESS) 145 MCG CAPS capsule Take 1 capsule (145 mcg total) by mouth daily before breakfast.   losartan (COZAAR) 50 MG tablet Take 50 mg by mouth daily.   metoprolol succinate (TOPROL-XL) 100 MG 24 hr tablet Take 1 tablet (100 mg total) by mouth in the morning and at bedtime.   nitrofurantoin, macrocrystal-monohydrate, (MACROBID) 100 MG capsule Take one tab po bid   omeprazole (PRILOSEC) 40 MG capsule TAKE 1 CAPSULE (40 MG TOTAL) BY MOUTH DAILY.   ondansetron (ZOFRAN) 8 MG tablet Take 8 mg by mouth every 8 (eight) hours as needed.   ondansetron (ZOFRAN-ODT) 4 MG disintegrating tablet Take 1 tablet (4 mg total) by mouth every 8 (eight) hours as needed for nausea or vomiting.   potassium  chloride SA (KLOR-CON M20) 20 MEQ tablet TAKE 2 TABLETS BY MOUTH TWICE A DAY   spironolactone (ALDACTONE) 25 MG tablet Take 1 tablet (25 mg total) by mouth daily.   torsemide (DEMADEX) 20 MG tablet Take 1 tablet (20 mg total) by mouth 2 (two) times daily.   traZODone (DESYREL) 50 MG tablet TAKE 1 TABLET BY MOUTH EVERYDAY AT BEDTIME   Vitamin D, Ergocalciferol, (DRISDOL) 1.25 MG (50000 UNIT) CAPS capsule TAKE 1 CAPSULE BY MOUTH ONE TIME PER WEEK   warfarin (COUMADIN) 2 MG tablet Take 1-2 tablets Daily or as prescribed by Coumadin Clinic     Allergies:  Morphine, Oxycodone hcl, and Dilaudid [hydromorphone hcl]   Social History   Socioeconomic History   Marital status: Married    Spouse name: Not on file   Number of children: Not on file   Years of education: Not on file   Highest education level: Not on file  Occupational History   Not on file  Tobacco Use   Smoking status: Former    Current packs/day: 0.00    Average packs/day: 0.5 packs/day for 20.0 years (10.0 ttl pk-yrs)    Types: Cigarettes    Start date: 01/1996    Quit date: 01/2016    Years since quitting: 6.9   Smokeless tobacco: Never  Vaping Use   Vaping status: Never Used  Substance and Sexual Activity   Alcohol use: Not Currently    Comment: rarely   Drug use: No   Sexual activity: Not on file  Other Topics Concern   Not on file  Social History Narrative   Not on file   Social Drivers of Health   Financial Resource Strain: Low Risk  (05/07/2020)   Overall Financial Resource Strain (CARDIA)    Difficulty of Paying Living Expenses: Not very hard  Food Insecurity: Unknown (11/16/2021)   Hunger Vital Sign    Worried About Running Out of Food in the Last Year: Not on file    Ran Out of Food in the Last Year: Never true  Transportation Needs: No Transportation Needs (11/16/2021)   PRAPARE - Administrator, Civil Service (Medical): No    Lack of Transportation (Non-Medical): No  Physical Activity:  Not on file  Stress: Not on file  Social Connections: Not on file     Family History: The patient's family history includes Breast cancer in her maternal grandmother and mother; Cancer in her maternal aunt and maternal grandmother; Cancer (age of onset: 65) in her mother; Hypertension in her father and mother; Osteoarthritis in her father.  ROS:   Please see the history of present illness.     All other systems reviewed and are negative.  EKGs/Labs/Other Studies Reviewed:    The following studies were reviewed today:  Stress PET   Findings are consistent with no infarction. The study is intermediate risk.  Likely, small perfusion defect is related to apical thinning, as stress flow is normal.  Cannot exclude mild apical ischemia.   LV perfusion is abnormal. Defect 1: There is a small defect with mild reduction in uptake present in the apical anterior and lateral location(s) that is reversible. There is normal wall motion in the defect area. Consistent with artifact.   End diastolic cavity size is normal. End systolic cavity size is normal.   Myocardial blood flow was computed to be 0.70ml/g/min at rest and 2.14ml/g/min at stress. Global myocardial blood flow reserve was 2.07 and was normal.   Coronary calcium was present on the attenuation correction CT images. Mild coronary calcifications were present. Mitral valve replacement noted with post procedural pericardial calcification.  Aortic atherosclerosis noted. Coronary calcifications were present in the left anterior descending artery and right coronary artery distribution(s).   Electronically Signed  By: Riley Lam M.D.  Echo 2023  1. Left ventricular ejection fraction, by estimation, is 60 to 65%. The  left ventricle has normal function. The left ventricle has no regional  wall motion abnormalities. Left ventricular diastolic parameters are  indeterminate.   2. Right ventricular systolic function is normal. The right  ventricular  size is normal. There is mildly  elevated pulmonary artery systolic  pressure. The estimated right ventricular systolic pressure is 42.5 mmHg.   3. The mitral valve has been repaired/replaced. No evidence of mitral  valve regurgitation. No evidence of mitral stenosis. The mean mitral valve  gradient is 4.0 mmHg. There is a Onyx mechanical valve present in the  mitral position. Procedure Date:  04/2020.   4. The aortic valve is normal in structure. Aortic valve regurgitation is  not visualized. No aortic stenosis is present. Aortic valve mean gradient  measures 6.3 mmHg. Aortic valve Vmax measures 1.92 m/s.   5. The inferior vena cava is normal in size with greater than 50%  respiratory variability, suggesting right atrial pressure of 3 mmHg.    Heart monitor 10/2020 Event Monitor Patch Wear Time:  6 days and 17 hours (2022-09-02T16:16:54-0400 to 2022-09-09T09:40:50-0400)   Patient had a min HR of 60 bpm, max HR of 167 bpm, and avg HR of 77 bpm.  Predominant underlying rhythm was Sinus Rhythm.    7 Supraventricular Tachycardia runs occurred, the run with the fastest interval lasting 4 beats with a max rate of 167 bpm, the longest lasting 6 beats with an avg rate of 106 bpm.   Isolated SVEs were rare (<1.0%), SVE Couplets were rare (<1.0%), and SVE Triplets were rare (<1.0%). Isolated VEs were rare (<1.0%, 178), VE Couplets were rare (<1.0%, 5), and VE Triplets were rare (<1.0%, 1). Ventricular Bigeminy was present.    No patient triggered events noted   Signed, Dossie Arbour, MD, Ph.D [facility]    EKG:  EKG is ordered today.  The ekg ordered today demonstrates SB 56bpm, no changes  Recent Labs: 06/25/2022: TSH 0.629 09/14/2022: B Natriuretic Peptide 113.1 10/25/2022: Magnesium 2.1 11/22/2022: ALT 25; BUN 44; Creatinine, Ser 2.28; Hemoglobin 10.4; Platelets 331; Potassium 3.9; Sodium 135  Recent Lipid Panel    Component Value Date/Time   CHOL 160 06/25/2022 1012    CHOL 170 06/26/2021 0912   TRIG 185 (H) 06/25/2022 1012   HDL 33 (L) 06/25/2022 1012   HDL 36 (L) 06/26/2021 0912   CHOLHDL 4.8 06/25/2022 1012   VLDL 37 06/25/2022 1012   LDLCALC 90 06/25/2022 1012   LDLCALC 106 (H) 06/26/2021 0912   Physical Exam:    VS:  BP (!) 130/52 (BP Location: Left Wrist, Patient Position: Sitting, Cuff Size: Normal)   Pulse (!) 55   Ht 5\' 4"  (1.626 m)   Wt 246 lb 9.6 oz (111.9 kg)   SpO2 99%   BMI 42.33 kg/m     Wt Readings from Last 3 Encounters:  01/07/23 246 lb 9.6 oz (111.9 kg)  12/28/22 249 lb 3.2 oz (113 kg)  12/06/22 247 lb 8 oz (112.3 kg)     GEN:  Well nourished, well developed in no acute distress HEENT: Normal NECK: No JVD; No carotid bruits LYMPHATICS: No lymphadenopathy CARDIAC: RRR, + murmur, no rubs, gallops RESPIRATORY:  Clear to auscultation without rales, wheezing or rhonchi  ABDOMEN: Soft, non-tender, non-distended MUSCULOSKELETAL:  No edema; No deformity  SKIN: Warm and dry NEUROLOGIC:  Alert and oriented x 3 PSYCHIATRIC:  Normal affect   ASSESSMENT:    1. Fatigue, unspecified type   2. S/P mitral valve replacement with metallic valve   3. Atrial fibrillation, unspecified type (HCC)   4. Medication management   5. Chronic diastolic heart failure (HCC)    PLAN:    In order of problems listed above:  Generalized fatigue Patient reports tiredness and generalized fatigue  for the last few months. She has minimal SOB and chest pain. She had a GI bug a few weeks ago with nausea, vomiting, fever and chills. Last week she was treated for a UTI. Recent stress test was intermediate risk with possible areas of ischemia vs artifact, no prior infarction, mild coronary calcification seen on CT images, overall OK. Echo is pending still. She appears euvolemic on exam. She feels she may be coming down with a cold. I will check a BMET and CBC and continue to monitor symptoms for now.   S/p MVR Repeat echo as above. Echo in 2023 showed  normally functioning valve. Continue Coumadin.   Pafib s/p MAZE In NSR today. Continue Coumadin.   Chronic diastolic heart failure with moderate pulmonary HTN She is euvolemic on exam. Continue Torsemide, spironolactone and potassium. Repeat echo as above.  Disposition: Follow up in 3 month(s) with MD/APP   Signed, Naiah Donahoe David Stall, PA-C  01/07/2023 3:41 PM    [facility]

## 2023-01-08 LAB — BASIC METABOLIC PANEL
BUN/Creatinine Ratio: 18 (ref 9–23)
BUN: 39 mg/dL — ABNORMAL HIGH (ref 6–24)
CO2: 24 mmol/L (ref 20–29)
Calcium: 9.9 mg/dL (ref 8.7–10.2)
Chloride: 98 mmol/L (ref 96–106)
Creatinine, Ser: 2.11 mg/dL — ABNORMAL HIGH (ref 0.57–1.00)
Glucose: 96 mg/dL (ref 70–99)
Potassium: 4.8 mmol/L (ref 3.5–5.2)
Sodium: 140 mmol/L (ref 134–144)
eGFR: 27 mL/min/{1.73_m2} — ABNORMAL LOW (ref >59)

## 2023-01-08 LAB — CBC
Hematocrit: 32.7 % — ABNORMAL LOW (ref 34.0–46.6)
Hemoglobin: 10.6 g/dL — ABNORMAL LOW (ref 11.1–15.9)
MCH: 28.6 pg (ref 26.6–33.0)
MCHC: 32.4 g/dL (ref 31.5–35.7)
MCV: 88 fL (ref 79–97)
Platelets: 371 10*3/uL (ref 150–450)
RBC: 3.7 x10E6/uL — ABNORMAL LOW (ref 3.77–5.28)
RDW: 12.6 % (ref 11.7–15.4)
WBC: 6.4 10*3/uL (ref 3.4–10.8)

## 2023-01-17 ENCOUNTER — Encounter: Payer: Self-pay | Admitting: Internal Medicine

## 2023-01-20 ENCOUNTER — Ambulatory Visit: Payer: Medicare Other | Attending: Medical

## 2023-01-20 ENCOUNTER — Encounter: Payer: Self-pay | Admitting: Internal Medicine

## 2023-01-20 DIAGNOSIS — Z954 Presence of other heart-valve replacement: Secondary | ICD-10-CM | POA: Insufficient documentation

## 2023-01-20 LAB — ECHOCARDIOGRAM COMPLETE: Area-P 1/2: 1.8 cm2

## 2023-01-24 ENCOUNTER — Telehealth: Payer: Self-pay | Admitting: Physician Assistant

## 2023-01-24 NOTE — Telephone Encounter (Signed)
 Per [facility]G, patient declined SS due to finances-Toni

## 2023-01-26 ENCOUNTER — Other Ambulatory Visit: Payer: Self-pay | Admitting: Physician Assistant

## 2023-01-27 ENCOUNTER — Ambulatory Visit: Payer: Medicare Other | Admitting: Physician Assistant

## 2023-01-27 NOTE — Telephone Encounter (Signed)
 Please review that she need pres

## 2023-01-29 ENCOUNTER — Other Ambulatory Visit: Payer: Self-pay | Admitting: Physician Assistant

## 2023-02-02 ENCOUNTER — Ambulatory Visit: Payer: Medicare Other | Attending: Cardiovascular Disease

## 2023-02-02 DIAGNOSIS — I4891 Unspecified atrial fibrillation: Secondary | ICD-10-CM | POA: Insufficient documentation

## 2023-02-02 DIAGNOSIS — Z5181 Encounter for therapeutic drug level monitoring: Secondary | ICD-10-CM | POA: Diagnosis not present

## 2023-02-02 DIAGNOSIS — I050 Rheumatic mitral stenosis: Secondary | ICD-10-CM | POA: Diagnosis present

## 2023-02-02 LAB — POCT INR: INR: 3.2 — AB (ref 2.0–3.0)

## 2023-02-02 NOTE — Patient Instructions (Signed)
continue 1 tablet daily except 1/2 tablet on Mondays and Fridays. Stay consistent with greens.  - Recheck INR in 6 weeks [phone]

## 2023-02-05 ENCOUNTER — Encounter: Payer: Self-pay | Admitting: Nurse Practitioner

## 2023-02-08 ENCOUNTER — Ambulatory Visit (INDEPENDENT_AMBULATORY_CARE_PROVIDER_SITE_OTHER): Payer: Medicare Other | Admitting: Internal Medicine

## 2023-02-08 VITALS — BP 130/70 | HR 62 | Temp 97.8°F | Resp 16 | Ht 64.0 in | Wt 250.0 lb

## 2023-02-08 DIAGNOSIS — R21 Rash and other nonspecific skin eruption: Secondary | ICD-10-CM

## 2023-02-08 DIAGNOSIS — R300 Dysuria: Secondary | ICD-10-CM | POA: Diagnosis not present

## 2023-02-08 DIAGNOSIS — E1165 Type 2 diabetes mellitus with hyperglycemia: Secondary | ICD-10-CM

## 2023-02-08 LAB — POCT URINALYSIS DIPSTICK
Bilirubin, UA: NEGATIVE
Blood, UA: NEGATIVE
Glucose, UA: NEGATIVE
Leukocytes, UA: NEGATIVE
Nitrite, UA: NEGATIVE
Protein, UA: NEGATIVE
Spec Grav, UA: <=1.005 — AB (ref 1.010–1.025)
Urobilinogen, UA: 0.2 U/dL
pH, UA: 5 (ref 5.0–8.0)

## 2023-02-08 MED ORDER — FAMCICLOVIR 500 MG PO TABS: ORAL_TABLET | ORAL | 1 refills | Status: DC

## 2023-02-08 MED ORDER — OZEMPIC (0.25 OR 0.5 MG/DOSE) 2 MG/3ML ~~LOC~~ SOPN: PEN_INJECTOR | SUBCUTANEOUS | 0 refills | Status: DC

## 2023-02-08 NOTE — Progress Notes (Unsigned)
[facility] [address] [place], [place] [address]  Internal MEDICINE  Office Visit Note  Patient Name: Leslie [name]  [id number]  [id number]  Date of Service: 02/08/2023  Chief Complaint  Patient presents with   Follow-up    labs   Urinary Tract Infection    HPI     Current Medication: Outpatient Encounter Medications as of 02/08/2023  Medication Sig   ALPRAZolam (XANAX) 0.5 MG tablet TAKE 1/2 TABLET BY MOUTH AT BEDTIME AS NEEDED FOR ANXIETY   amLODipine (NORVASC) 5 MG tablet Take 1 tablet (5 mg total) by mouth daily.   aspirin 81 MG EC tablet Take 81 mg by mouth daily.   atorvastatin (LIPITOR) 80 MG tablet Take 1 tablet (80 mg total) by mouth daily.   bisacodyl (DULCOLAX) 5 MG EC tablet One tab at night Monday wed and friday   calcium carbonate (TUMS - DOSED IN MG ELEMENTAL CALCIUM) 500 MG chewable tablet Chew 500 mg by mouth daily as needed for indigestion or heartburn.   estradiol (ESTRACE) 0.1 MG/GM vaginal cream 1/2 applicator once a week   fluconazole (DIFLUCAN) 150 MG tablet Use as directed once a week for 4 weeks   hyoscyamine (LEVBID) 0.375 MG 12 hr tablet Take 1 tablet (0.375 mg total) by mouth 2 (two) times daily.   Insulin Pen Needle 32G X 6 MM MISC Use with victoza   linaclotide (LINZESS) 145 MCG CAPS capsule Take 1 capsule (145 mcg total) by mouth daily before breakfast.   losartan (COZAAR) 50 MG tablet Take 50 mg by mouth daily.   metoprolol succinate (TOPROL-XL) 100 MG 24 hr tablet Take 1 tablet (100 mg total) by mouth in the morning and at bedtime.   omeprazole (PRILOSEC) 40 MG capsule TAKE 1 CAPSULE (40 MG TOTAL) BY MOUTH DAILY.   ondansetron (ZOFRAN) 8 MG tablet Take 8 mg by mouth every 8 (eight) hours as needed.   ondansetron (ZOFRAN-ODT) 4 MG disintegrating tablet Take 1 tablet (4 mg total) by mouth every 8 (eight) hours as needed for nausea or vomiting.   potassium chloride SA (KLOR-CON M20) 20 MEQ tablet TAKE 2 TABLETS BY MOUTH TWICE A  DAY   spironolactone (ALDACTONE) 25 MG tablet Take 1 tablet (25 mg total) by mouth daily.   torsemide (DEMADEX) 20 MG tablet Take 1 tablet (20 mg total) by mouth 2 (two) times daily.   traZODone (DESYREL) 50 MG tablet TAKE 1 TABLET BY MOUTH EVERYDAY AT BEDTIME   Vitamin D, Ergocalciferol, (DRISDOL) 1.25 MG (50000 UNIT) CAPS capsule TAKE 1 CAPSULE BY MOUTH ONE TIME PER WEEK   warfarin (COUMADIN) 2 MG tablet Take 1-2 tablets Daily or as prescribed by [facility]   [DISCONTINUED] nitrofurantoin, macrocrystal-monohydrate, (MACROBID) 100 MG capsule Take one tab po bid   No facility-administered encounter medications on file as of 02/08/2023.    Surgical History: Past Surgical History:  Procedure Laterality Date   ABDOMINAL HYSTERECTOMY     total   ABDOMINAL HYSTERECTOMY     BREAST BIOPSY Bilateral 2012   BREAST BIOPSY Right 08-07-12   fibroadenomatous changes and columnar cells   BREAST BIOPSY  02/03/2015   stereo byrnett   BUBBLE STUDY  04/01/2020   Procedure: BUBBLE STUDY;  Surgeon: Parke Poisson, MD;  Location: [facility] ENDOSCOPY;  Service: Cardiovascular;;   CARDIAC CATHETERIZATION     CHOLECYSTECTOMY N/A 02/27/2016   Procedure: LAPAROSCOPIC CHOLECYSTECTOMY;  Surgeon: Kieth Brightly, MD;  Location: [facility] ORS;  Service: General;  Laterality: N/A;  CLIPPING OF ATRIAL APPENDAGE  04/29/2020   Procedure: CLIPPING OF ATRIAL APPENDAGE USING ATRICURE  PRO2 CLIP SIZE ;  Surgeon: Purcell Nails, MD;  Location: [facility] OR;  Service: Open Heart Surgery;;   COLONOSCOPY WITH PROPOFOL N/A 09/26/2018   Procedure: COLONOSCOPY WITH PROPOFOL;  Surgeon: Midge Minium, MD;  Location: [facility] ENDOSCOPY;  Service: Endoscopy;  Laterality: N/A;   COLONOSCOPY WITH PROPOFOL N/A 07/16/2021   Procedure: COLONOSCOPY WITH PROPOFOL;  Surgeon: Midge Minium, MD;  Location: [facility] ENDOSCOPY;  Service: Endoscopy;  Laterality: N/A;   CYSTOSCOPY W/ RETROGRADES Right 08/18/2018   Procedure: CYSTOSCOPY WITH RETROGRADE PYELOGRAM;   Surgeon: Sondra Come, MD;  Location: [facility] ORS;  Service: Urology;  Laterality: Right;   CYSTOSCOPY/URETEROSCOPY/HOLMIUM LASER/STENT PLACEMENT Right 08/18/2018   Procedure: CYSTOSCOPY/URETEROSCOPY/STENT PLACEMENT;  Surgeon: Sondra Come, MD;  Location: [facility] ORS;  Service: Urology;  Laterality: Right;   DIAGNOSTIC LAPAROSCOPY     ESOPHAGOGASTRODUODENOSCOPY N/A 07/16/2021   Procedure: ESOPHAGOGASTRODUODENOSCOPY (EGD);  Surgeon: Midge Minium, MD;  Location[facility] END[facility]COPY;  Service: Endoscopy;  Laterality: N/A;   ESOPHAGOGASTRODUODENOSCOPY (EGD) WITH PROPOFOL N/A 09/26/2018   Procedure: ESOPHAGOGASTRODUODENOSCOPY (EGD) WITH PROPOFOL;  Surgeon: Midge Minium, MD;  Location: [facility] ENDOSCOPY;  Service: Endoscopy;  Laterality: N/A;   GIVENS CAPSULE STUDY N/A 11/03/2018   Procedure: GIVENS CAPSULE STUDY;  Surgeon: Midge Minium, MD;  Location: [facility] ENDOSCOPY;  Service: Endoscopy;  Laterality: N/A;   JOINT REPLACEMENT Left    knee   KNEE ARTHROPLASTY Right 04/09/2019   Procedure: COMPUTER ASSISTED TOTAL KNEE ARTHROPLASTY;  Surgeon: Donato Heinz, MD;  Location: [facility] ORS;  Service: Orthopedics;  Laterality: Right;   KNEE CLOSED REDUCTION Left 04/15/2015   Procedure: CLOSED MANIPULATION KNEE;  Surgeon: Juanell Fairly, MD;  Location: [facility] ORS;  Service: Orthopedics;  Laterality: Left;   KNEE SURGERY     MAZE N/A 04/29/2020   Procedure: MAZE;  Surgeon: Purcell Nails, MD;  Location: [facility] OR;  Service: Open Heart Surgery;  Laterality: N/A;   MITRAL VALVE REPLACEMENT N/A 04/29/2020   Procedure: MITRAL VALVE (MV) REPLACEMENT USING ON-X VALVE SIZE 27/29MM;  Surgeon: Purcell Nails, MD;  Location: [facility] OR;  Service: Open Heart Surgery;  Laterality: N/A;   MULTIPLE EXTRACTIONS WITH ALVEOLOPLASTY N/A 02/28/2020   Procedure: MULTIPLE EXTRACTION WITH ALVEOLOPLASTY;  Surgeon: Sharman Cheek, DMD;  Location: [facility] OR;  Service: Dentistry;  Laterality: N/A;   RIGHT/LEFT HEART CATH AND CORONARY ANGIOGRAPHY Bilateral  09/06/2019   Procedure: RIGHT/LEFT HEART CATH AND CORONARY ANGIOGRAPHY;  Surgeon: Antonieta Iba, MD;  Location: [facility] INVASIVE CV LAB;  Service: Cardiovascular;  Laterality: Bilateral;   TEE WITHOUT CARDIOVERSION N/A 09/13/2017   Procedure: TRANSESOPHAGEAL ECHOCARDIOGRAM (TEE);  Surgeon: Yvonne Kendall, MD;  Location: [facility] ORS;  Service: Cardiovascular;  Laterality: N/A;   TEE WITHOUT CARDIOVERSION N/A 04/01/2020   Procedure: TRANSESOPHAGEAL ECHOCARDIOGRAM (TEE);  Surgeon: Parke Poisson, MD;  Location: [facility] ENDOSCOPY;  Service: Cardiovascular;  Laterality: N/A;   TEE WITHOUT CARDIOVERSION N/A 04/29/2020   Procedure: TRANSESOPHAGEAL ECHOCARDIOGRAM (TEE);  Surgeon: Purcell Nails, MD;  Location: [facility] OR;  Service: Open Heart Surgery;  Laterality: N/A;   TOTAL KNEE ARTHROPLASTY Left 12/25/2014   Procedure: TOTAL KNEE ARTHROPLASTY;  Surgeon: Juanell Fairly, MD;  Location: [facility] ORS;  Service: Orthopedics;  Laterality: Left;   TUBAL LIGATION      Medical History: Past Medical History:  Diagnosis Date   (HFpEF) heart failure with preserved ejection fraction (HCC)    a. 08/2017 Echo: EF 55-60%.  Grade 2 diastolic  dysfunction; b. 05/2020 Echo: EF 50-55%, no rwma, Nl RV fxn, nl fxn'ing mech MV.   Allergy    Anemia    Anxiety    Asthma    BRCA negative 03/22/2013   Bronchitis 02/19/2016   ON LEVAQUIN PO   Chest tightness    a. 08/2019 Cath: nl cors.   Cigarette smoker 09/11/2017   8-10 day   Dysrhythmia    GERD (gastroesophageal reflux disease)    History of kidney stones    Hyperlipidemia    Hypertension    Interstitial lung disease (HCC)    a. CT 2013 b. 02/2018 CXR noted recurrent intersistial changes ILD vs chronic bronchitis   Moderate mitral stenosis    a.  08/2017 TEE: EF 60 to 65%.  Moderate mitral stenosis.  Mean gradient 14 mmHg.  Valve area 2.59 cm by planimetry, 2.72 cm by pressure half-time.   PAF (paroxysmal atrial fibrillation) (HCC)    a. 08/2017 s/p TEE/DCCV; b.  CHA2DS2VASc = 2-->warfarin.   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 04/2020 s/p 27/29 mm Onyx mech mitral valve-->chronic coumadin; b. 05/2020 Echo: Nl fxn'ing mech MV.    Family History: Family History  Problem Relation Age of Onset   Breast cancer Mother    Cancer Mother 31       breast   Hypertension Mother    Osteoarthritis Father    Hypertension Father    Cancer Maternal Aunt        breast   Breast cancer Maternal Grandmother    Cancer Maternal Grandmother        breast    Social History   Socioeconomic History   Marital status: Married    Spouse name: Not on file   Number of children: Not on file   Years of education: Not on file   Highest education level: Not on file  Occupational History   Not on file  Tobacco Use   Smoking status: Former    Current packs/day: 0.00    Average packs/day: 0.5 packs/day for 20.0 years (10.0 ttl pk-yrs)    Types: Cigarettes    Start date: 01/1996    Quit date: 01/2016    Years since quitting: 7.0   Smokeless tobacco: Never  Vaping Use   Vaping status: Never Used  Substance and Sexual Activity   Alcohol use: Not Currently    Comment: rarely   Drug use: No   Sexual activity: Not on file  Other Topics Concern   Not on file  Social History Narrative   Not on file   Social Drivers of Health   Financial Resource Strain: Low Risk  (05/07/2020)   Overall Financial Resource Strain (CARDIA)    Difficulty of Paying Living Expenses: Not very hard  Food Insecurity: Unknown (11/16/2021)   Hunger Vital Sign    Worried About Running Out of Food in the Last Year: Not on file    Ran Out of Food in the Last Year: Never true  Transportation Needs: No Transportation Needs (11/16/2021)   PRAPARE -  Administrator, Civil Service (Medical): No    Lack of Transportation (Non-Medical): No  Physical Activity: Not on file  Stress: Not on file  Social Connections: Not on file  Intimate Partner Violence: Not  At Risk (11/16/2021)   Humiliation, Afraid, Rape, and Kick questionnaire    Fear of Current or Ex-Partner: No    Emotionally Abused: No    Physically Abused: No    Sexually Abused: No      Review of Systems  Vital Signs: BP 130/70   Pulse 62   Temp 97.8 F (36.6 C)   Resp 16   Ht 5\' 4"  (1.626 m)   Wt 250 lb (113.4 kg)   SpO2 97%   BMI 42.91 kg/m    Physical Exam     Assessment/Plan:   General Counseling: Kendall verbalizes understanding of the findings of todays visit and agrees with plan of treatment. I have discussed any further diagnostic evaluation that may be needed or ordered today. We also reviewed her medications today. she has been encouraged to call the office with any questions or concerns that should arise related to todays visit.    Orders Placed This Encounter  Procedures   POCT Urinalysis Dipstick    No orders of the defined types were placed in this encounter.   Total time spent:30 Minutes Time spent includes review of chart, medications, test results, and follow up plan with the patient.   [place] Controlled Substance Database was reviewed by me.   Dr Lyndon Code Internal medicine

## 2023-02-27 ENCOUNTER — Other Ambulatory Visit: Payer: Self-pay | Admitting: Cardiovascular Disease

## 2023-03-01 ENCOUNTER — Encounter: Payer: Self-pay | Admitting: Internal Medicine

## 2023-03-02 ENCOUNTER — Other Ambulatory Visit: Payer: Self-pay | Admitting: Internal Medicine

## 2023-03-07 DIAGNOSIS — I129 Hypertensive chronic kidney disease with stage 1 through stage 4 chronic kidney disease, or unspecified chronic kidney disease: Secondary | ICD-10-CM | POA: Diagnosis not present

## 2023-03-07 DIAGNOSIS — N2581 Secondary hyperparathyroidism of renal origin: Secondary | ICD-10-CM | POA: Diagnosis not present

## 2023-03-07 DIAGNOSIS — E876 Hypokalemia: Secondary | ICD-10-CM | POA: Diagnosis not present

## 2023-03-07 DIAGNOSIS — E790 Hyperuricemia without signs of inflammatory arthritis and tophaceous disease: Secondary | ICD-10-CM | POA: Diagnosis not present

## 2023-03-07 DIAGNOSIS — N184 Chronic kidney disease, stage 4 (severe): Secondary | ICD-10-CM | POA: Diagnosis not present

## 2023-03-08 ENCOUNTER — Encounter: Payer: Self-pay | Admitting: Internal Medicine

## 2023-03-08 ENCOUNTER — Ambulatory Visit (INDEPENDENT_AMBULATORY_CARE_PROVIDER_SITE_OTHER): Payer: Medicare Other | Admitting: Internal Medicine

## 2023-03-08 VITALS — BP 143/72 | HR 88 | Temp 97.9°F | Resp 16 | Ht 64.0 in | Wt 249.8 lb

## 2023-03-08 DIAGNOSIS — Z113 Encounter for screening for infections with a predominantly sexual mode of transmission: Secondary | ICD-10-CM

## 2023-03-08 DIAGNOSIS — N898 Other specified noninflammatory disorders of vagina: Secondary | ICD-10-CM

## 2023-03-08 DIAGNOSIS — E1165 Type 2 diabetes mellitus with hyperglycemia: Secondary | ICD-10-CM | POA: Diagnosis not present

## 2023-03-08 NOTE — Progress Notes (Signed)
[facility] [address] [place], [place] [address]  Internal MEDICINE  Office Visit Note  Patient Name: Leslie [name]  [id number]  [id number]  Date of Service: 03/08/2023  Chief Complaint  Patient presents with   Follow-up   Gastroesophageal Reflux   Hypertension   Hyperlipidemia    HPI  Pt is seen for Pap smear due to c/o vaginal irritation, has h/o total hysterectomy  Seen by nephrology for CKD, Farxiga and Allopurinol was started  Pt is still on famvir, thinks she is feeling better New problem today is muscle cramps in her legs, hands and abdomen Pt is on Ozempic     Current Medication: Outpatient Encounter Medications as of 03/08/2023  Medication Sig   allopurinol (ZYLOPRIM) 100 MG tablet Take 100 mg by mouth daily.   ALPRAZolam (XANAX) 0.5 MG tablet TAKE 1/2 TABLET BY MOUTH AT BEDTIME AS NEEDED FOR ANXIETY   amLODipine (NORVASC) 5 MG tablet Take 1 tablet (5 mg total) by mouth daily.   aspirin 81 MG EC tablet Take 81 mg by mouth daily.   atorvastatin (LIPITOR) 80 MG tablet Take 1 tablet (80 mg total) by mouth daily.   bisacodyl (DULCOLAX) 5 MG EC tablet One tab at night Monday wed and friday   calcium carbonate (TUMS - DOSED IN MG ELEMENTAL CALCIUM) 500 MG chewable tablet Chew 500 mg by mouth daily as needed for indigestion or heartburn.   dapagliflozin propanediol (FARXIGA) 10 MG TABS tablet Take 10 mg by mouth daily.   estradiol (ESTRACE) 0.1 MG/GM vaginal cream 1/2 applicator once a week   famciclovir (FAMVIR) 500 MG tablet TAKE ONE TAB BY MOUTH TWICE A DAY FOR INFECTION   fluconazole (DIFLUCAN) 150 MG tablet Use as directed once a week for 4 weeks   hyoscyamine (LEVBID) 0.375 MG 12 hr tablet Take 1 tablet (0.375 mg total) by mouth 2 (two) times daily.   Insulin Pen Needle 32G X 6 MM MISC Use with victoza   linaclotide (LINZESS) 145 MCG CAPS capsule Take 1 capsule (145 mcg total) by mouth daily before breakfast.   losartan (COZAAR) 50 MG tablet Take  50 mg by mouth daily.   metoprolol succinate (TOPROL-XL) 100 MG 24 hr tablet Take 1 tablet (100 mg total) by mouth in the morning and at bedtime.   omeprazole (PRILOSEC) 40 MG capsule TAKE 1 CAPSULE (40 MG TOTAL) BY MOUTH DAILY.   ondansetron (ZOFRAN) 8 MG tablet Take 8 mg by mouth every 8 (eight) hours as needed.   ondansetron (ZOFRAN-ODT) 4 MG disintegrating tablet Take 1 tablet (4 mg total) by mouth every 8 (eight) hours as needed for nausea or vomiting.   potassium chloride SA (KLOR-CON M20) 20 MEQ tablet TAKE 2 TABLETS BY MOUTH TWICE A DAY   Semaglutide,0.25 or 0.5MG /DOS, (OZEMPIC, 0.25 OR 0.5 MG/DOSE,) 2 MG/3ML SOPN Take 0.25 mg first week and then increase to 0.5 mg on 2nd. 3rd and 4th week   spironolactone (ALDACTONE) 25 MG tablet Take 1 tablet (25 mg total) by mouth daily.   torsemide (DEMADEX) 20 MG tablet Take 1 tablet (20 mg total) by mouth 2 (two) times daily.   traZODone (DESYREL) 50 MG tablet TAKE 1 TABLET BY MOUTH EVERYDAY AT BEDTIME   Vitamin D, Ergocalciferol, (DRISDOL) 1.25 MG (50000 UNIT) CAPS capsule TAKE 1 CAPSULE BY MOUTH ONE TIME PER WEEK   warfarin (COUMADIN) 2 MG tablet TAKE 1-2 TABLETS DAILY OR AS PRESCRIBED BY COUMADIN CLINIC   No facility-administered encounter medications on file  as of 03/08/2023.    Surgical History: Past Surgical History:  Procedure Laterality Date   ABDOMINAL HYSTERECTOMY     total   ABDOMINAL HYSTERECTOMY     BREAST BIOPSY Bilateral 2012   BREAST BIOPSY Right 08-07-12   fibroadenomatous changes and columnar cells   BREAST BIOPSY  02/03/2015   stereo byrnett   BUBBLE STUDY  04/01/2020   Procedure: BUBBLE STUDY;  Surgeon: Parke Poisson, MD;  Location: [facility] ENDOSCOPY;  Service: Cardiovascular;;   CARDIAC CATHETERIZATION     CHOLECYSTECTOMY N/A 02/27/2016   Procedure: LAPAROSCOPIC CHOLECYSTECTOMY;  Surgeon: Kieth Brightly, MD;  Location: [facility] ORS;  Service: General;  Laterality: N/A;   CLIPPING OF ATRIAL APPENDAGE  04/29/2020    Procedure: CLIPPING OF ATRIAL APPENDAGE USING ATRICURE  PRO2 CLIP SIZE ;  Surgeon: Purcell Nails, MD;  Location: [facility] OR;  Service: Open Heart Surgery;;   COLONOSCOPY WITH PROPOFOL N/A 09/26/2018   Procedure: COLONOSCOPY WITH PROPOFOL;  Surgeon: Midge Minium, MD;  Location: [facility] ENDOSCOPY;  Service: Endoscopy;  Laterality: N/A;   COLONOSCOPY WITH PROPOFOL N/A 07/16/2021   Procedure: COLONOSCOPY WITH PROPOFOL;  Surgeon: Midge Minium, MD;  Location[facility] ENDOSCOPY;  Service: Endoscopy;  Laterality: N/A;   CYSTOSCOPY W/ RETROGRADES Right 08/18/2018   Procedure: CYSTOSCOPY WITH RETROGRADE PYELOGRAM;  Surgeon: Sondra Come, MD;  Location: [facility] ORS;  Service: Urology;  Laterality: Right;   CYSTOSCOPY/URETEROSCOPY/HOLMIUM LASER/STENT PLACEMENT Right 08/18/2018   Procedure: CYSTOSCOPY/URETEROSCOPY/STENT PLACEMENT;  Surgeon: Sondra Come, MD;  Location: [facility] ORS;  Service: Urology;  Laterality: Right;   DIAGNOSTIC LAPAROSCOPY     ESOPHAGOGASTRODUODENOSCOPY N/A 07/16/2021   Procedure: ESOPHAGOGASTRODUODENOSCOPY (EGD);  Surgeon: Midge Minium, MD;  Location: [facility] ENDOSCOPY;  Service: Endoscopy;  Laterality: N/A;   ESOPHAGOGASTRODUODENOSCOPY (EGD) WITH PROPOFOL N/A 09/26/2018   Procedure: ESOPHAGOGASTRODUODENOSCOPY (EGD) WITH PROPOFOL;  Surgeon: Midge Minium, MD;  Location: [facility] ENDOSCOPY;  Service: Endoscopy;  Laterality: N/A;   GIVENS CAPSULE STUDY N/A 11/03/2018   Procedure: GIVENS CAPSULE STUDY;  Surgeon: Midge Minium, MD;  Location: [facility] ENDOSCOPY;  Service: Endoscopy;  Laterality: N/A;   JOINT REPLACEMENT Left    knee   KNEE ARTHROPLASTY Right 04/09/2019   Procedure: COMPUTER ASSISTED TOTAL KNEE ARTHROPLASTY;  Surgeon: Donato Heinz, MD;  Location: [facility] ORS;  Service: Orthopedics;  Laterality: Right;   KNEE CLOSED REDUCTION Left 04/15/2015   Procedure: CLOSED MANIPULATION KNEE;  Surgeon: Juanell Fairly, MD;  Location: [facility] ORS;  Service: Orthopedics;  Laterality: Left;   KNEE SURGERY     MAZE  N/A 04/29/2020   Procedure: MAZE;  Surgeon: Purcell Nails, MD;  Location: [facility] OR;  Service: Open Heart Surgery;  Laterality: N/A;   MITRAL VALVE REPLACEMENT N/A 04/29/2020   Procedure: MITRAL VALVE (MV) REPLACEMENT USING ON-X VALVE SIZE 27/29MM;  Surgeon: Purcell Nails, MD;  Location: [facility] OR;  Service: Open Heart Surgery;  Laterality: N/A;   MULTIPLE EXTRACTIONS WITH ALVEOLOPLASTY N/A 02/28/2020   Procedure: MULTIPLE EXTRACTION WITH ALVEOLOPLASTY;  Surgeon: Sharman Cheek, DMD;  Location: [facility] OR;  Service: Dentistry;  Laterality: N/A;   RIGHT/LEFT HEART CATH AND CORONARY ANGIOGRAPHY Bilateral 09/06/2019   Procedure: RIGHT/LEFT HEART CATH AND CORONARY ANGIOGRAPHY;  Surgeon: Antonieta Iba, MD;  Location: [facility] INVASIVE CV LAB;  Service: Cardiovascular;  Laterality: Bilateral;   TEE WITHOUT CARDIOVERSION N/A 09/13/2017   Procedure: TRANSESOPHAGEAL ECHOCARDIOGRAM (TEE);  Surgeon: Yvonne Kendall, MD;  Location: [facility] ORS;  Service: Cardiovascular;  Laterality: N/A;   TEE WITHOUT CARDIOVERSION N/A 04/01/2020   Procedure:  TRANSESOPHAGEAL ECHOCARDIOGRAM (TEE);  Surgeon: Parke Poisson, MD;  Location: [facility] ENDOSCOPY;  Service: Cardiovascular;  Laterality: N/A;   TEE WITHOUT CARDIOVERSION N/A 04/29/2020   Procedure: TRANSESOPHAGEAL ECHOCARDIOGRAM (TEE);  Surgeon: Purcell Nails, MD;  Location: [facility] OR;  Service: Open Heart Surgery;  Laterality: N/A;   TOTAL KNEE ARTHROPLASTY Left 12/25/2014   Procedure: TOTAL KNEE ARTHROPLASTY;  Surgeon: Juanell Fairly, MD;  Location: [facility] ORS;  Service: Orthopedics;  Laterality: Left;   TUBAL LIGATION      Medical History: Past Medical History:  Diagnosis Date   (HFpEF) heart failure with preserved ejection fraction (HCC)    a. 08/2017 Echo: EF 55-60%.  Grade 2 diastolic dysfunction; b. 05/2020 Echo: EF 50-55%, no rwma, Nl RV fxn, nl fxn'ing mech MV.   Allergy    Anemia    Anxiety    Asthma    BRCA negative 03/22/2013   Bronchitis 02/19/2016   ON LEVAQUIN PO    Chest tightness    a. 08/2019 Cath: nl cors.   Cigarette smoker 09/11/2017   8-10 day   Dysrhythmia    GERD (gastroesophageal reflux disease)    History of kidney stones    Hyperlipidemia    Hypertension    Interstitial lung disease (HCC)    a. CT 2013 b. 02/2018 CXR noted recurrent intersistial changes ILD vs chronic bronchitis   Moderate mitral stenosis    a.  08/2017 TEE: EF 60 to 65%.  Moderate mitral stenosis.  Mean gradient 14 mmHg.  Valve area 2.59 cm by planimetry, 2.72 cm by pressure half-time.   PAF (paroxysmal atrial fibrillation) (HCC)    a. 08/2017 s/p TEE/DCCV; b. CHA2DS2VASc = 2-->warfarin.   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 04/2020 s/p 27/29 mm Onyx mech mitral valve-->chronic coumadin; b. 05/2020 Echo: Nl fxn'ing mech MV.    Family History: Family History  Problem Relation Age of Onset   Breast cancer Mother    Cancer Mother 11       breast   Hypertension Mother    Osteoarthritis Father    Hypertension Father    Cancer Maternal Aunt        breast   Breast cancer Maternal Grandmother    Cancer Maternal Grandmother        breast    Social History   Socioeconomic History   Marital status: Married    Spouse name: Not on file   Number of children: Not on file   Years of education: Not on file   Highest education level: Not on file  Occupational History   Not on file  Tobacco Use   Smoking status: Former    Current packs/day: 0.00    Average packs/day: 0.5 packs/day for 20.0 years (10.0 ttl pk-yrs)    Types: Cigarettes    Start date: 01/1996    Quit date: 01/2016    Years since quitting: 7.1   Smokeless tobacco: Never  Vaping Use   Vaping status: Never Used  Substance and Sexual Activity   Alcohol use: Not  Currently    Comment: rarely   Drug use: No   Sexual activity: Not on file  Other Topics Concern   Not on file  Social History Narrative   Not on file   Social Drivers of  Health   Financial Resource Strain: Low Risk  (05/07/2020)   Overall Financial Resource Strain (CARDIA)    Difficulty of Paying Living Expenses: Not very hard  Food Insecurity: Unknown (11/16/2021)   Hunger Vital Sign    Worried About Running Out of Food in the Last Year: Not on file    Ran Out of Food in the Last Year: Never true  Transportation Needs: No Transportation Needs (11/16/2021)   PRAPARE - Administrator, Civil Service (Medical): No    Lack of Transportation (Non-Medical): No  Physical Activity: Not on file  Stress: Not on file  Social Connections: Not on file  Intimate Partner Violence: Not At Risk (11/16/2021)   Humiliation, Afraid, Rape, and Kick questionnaire    Fear of Current or Ex-Partner: No    Emotionally Abused: No    Physically Abused: No    Sexually Abused: No      Review of Systems  Constitutional:  Negative for chills, fatigue, fever and unexpected weight change.  HENT:  Negative for congestion, mouth sores, postnasal drip, rhinorrhea, sneezing and sore throat.   Eyes:  Negative for redness.  Respiratory:  Negative for cough, chest tightness and shortness of breath.   Cardiovascular:  Negative for chest pain and palpitations.  Gastrointestinal:  Negative for abdominal pain, constipation, diarrhea, nausea and vomiting.  Endocrine: Negative.   Genitourinary:  Negative for decreased urine volume, dysuria, flank pain and frequency.  Musculoskeletal:  Negative for arthralgias, back pain, joint swelling and neck pain.       Cramps   Skin:  Negative for rash.  Neurological: Negative.  Negative for tremors and numbness.  Hematological:  Negative for adenopathy. Does not bruise/bleed easily.  Psychiatric/Behavioral: Negative.  Negative for behavioral problems (Depression),  sleep disturbance and suicidal ideas. The patient is not nervous/anxious.     Vital Signs: BP (!) 143/72   Pulse 88   Temp 97.9 F (36.6 C)   Resp 16   Ht 5\' 4"  (1.626 m)   Wt 249 lb 12.8 oz (113.3 kg)   SpO2 96%   BMI 42.88 kg/m    Physical Exam Constitutional:      Appearance: Normal appearance.  Eyes:     Extraocular Movements: Extraocular movements intact.     Pupils: Pupils are equal, round, and reactive to light.  Genitourinary:    General: Normal vulva.     Vagina: No vaginal discharge.     Comments: Pap  Neurological:     Mental Status: She is alert.        Assessment/Plan: 1. Type 2 diabetes mellitus with hyperglycemia, without long-term current use of insulin (HCC) (Primary) Will continue on Ozempic  - Urine Microalbumin w/creat. ratio  2. Routine cervical smear - IGP, Aptima HPV  3. Screening for STDs (sexually transmitted diseases) Will continue on Famvir for now ( clinically)  - NuSwab Vaginitis Plus (VG+)   General Counseling: Torra verbalizes understanding of the findings of todays visit and agrees with plan of treatment. I have discussed any further diagnostic evaluation that may be needed or ordered today. We also reviewed her medications today. she has been encouraged to call the office with any questions or concerns that should arise related to todays visit.    Orders Placed This Encounter  Procedures   Urine Microalbumin w/creat. ratio   NuSwab Vaginitis Plus (VG+)    No orders of the defined types were placed in this encounter.   Total time spent:35 Minutes Time spent  includes review of chart, medications, test results, and follow up plan with the patient.   [place] Controlled Substance Database was reviewed by me.   Dr Lyndon Code Internal medicine

## 2023-03-09 ENCOUNTER — Encounter: Payer: Self-pay | Admitting: Internal Medicine

## 2023-03-10 LAB — MICROALBUMIN / CREATININE URINE RATIO
Creatinine, Urine: 62.1 mg/dL
Microalb/Creat Ratio: 5 mg/g{creat} (ref 0–29)
Microalbumin, Urine: 3.4 ug/mL

## 2023-03-11 ENCOUNTER — Telehealth: Payer: Self-pay

## 2023-03-11 DIAGNOSIS — Z59819 Housing instability, housed unspecified: Secondary | ICD-10-CM | POA: Diagnosis not present

## 2023-03-11 DIAGNOSIS — G5602 Carpal tunnel syndrome, left upper limb: Secondary | ICD-10-CM | POA: Diagnosis not present

## 2023-03-11 DIAGNOSIS — G5622 Lesion of ulnar nerve, left upper limb: Secondary | ICD-10-CM | POA: Diagnosis not present

## 2023-03-11 DIAGNOSIS — Z5941 Food insecurity: Secondary | ICD-10-CM | POA: Diagnosis not present

## 2023-03-11 LAB — NUSWAB VAGINITIS PLUS (VG+)
Candida albicans, NAA: NEGATIVE
Candida glabrata, NAA: NEGATIVE

## 2023-03-11 LAB — IGP, APTIMA HPV: HPV Aptima: NEGATIVE

## 2023-03-11 NOTE — Telephone Encounter (Signed)
-----   Message from [facility] sent at 03/11/2023  9:58 AM EST ----- Please let pt know her pap smear is within normal limits, continue with medicine ( famvir)

## 2023-03-11 NOTE — Telephone Encounter (Signed)
 Patient notified

## 2023-03-11 NOTE — Progress Notes (Signed)
Please let pt know her pap smear is within normal limits, continue with medicine ( famvir)

## 2023-03-15 ENCOUNTER — Encounter: Payer: Self-pay | Admitting: Internal Medicine

## 2023-03-16 ENCOUNTER — Ambulatory Visit: Payer: Medicare Other | Attending: Cardiovascular Disease

## 2023-03-16 DIAGNOSIS — Z5181 Encounter for therapeutic drug level monitoring: Secondary | ICD-10-CM | POA: Diagnosis not present

## 2023-03-16 DIAGNOSIS — I050 Rheumatic mitral stenosis: Secondary | ICD-10-CM | POA: Diagnosis not present

## 2023-03-16 DIAGNOSIS — I4891 Unspecified atrial fibrillation: Secondary | ICD-10-CM | POA: Diagnosis not present

## 2023-03-16 LAB — POCT INR: INR: 2.3 (ref 2.0–3.0)

## 2023-03-16 NOTE — Patient Instructions (Signed)
 Take 1.5 tablets today only then continue 1 tablet daily except 1/2 tablet on Mondays and Fridays. Stay consistent with greens.  - Recheck INR in 6 weeks [id number]

## 2023-03-21 ENCOUNTER — Other Ambulatory Visit: Payer: Self-pay | Admitting: Physician Assistant

## 2023-03-21 DIAGNOSIS — F419 Anxiety disorder, unspecified: Secondary | ICD-10-CM

## 2023-03-21 NOTE — Telephone Encounter (Signed)
 Next appt 5/25

## 2023-03-26 DIAGNOSIS — Z20822 Contact with and (suspected) exposure to covid-19: Secondary | ICD-10-CM | POA: Diagnosis not present

## 2023-03-26 DIAGNOSIS — R509 Fever, unspecified: Secondary | ICD-10-CM | POA: Diagnosis not present

## 2023-03-26 DIAGNOSIS — J069 Acute upper respiratory infection, unspecified: Secondary | ICD-10-CM | POA: Diagnosis not present

## 2023-03-28 ENCOUNTER — Other Ambulatory Visit: Payer: Self-pay

## 2023-03-28 ENCOUNTER — Telehealth: Payer: Self-pay

## 2023-03-28 MED ORDER — BENZONATATE 100 MG PO CAPS: 100.0000 mg | ORAL_CAPSULE | Freq: Two times a day (BID) | ORAL | 0 refills | Status: DC | PRN

## 2023-03-28 MED ORDER — AZITHROMYCIN 250 MG PO TABS: ORAL_TABLET | ORAL | 0 refills | Status: DC

## 2023-03-28 NOTE — Telephone Encounter (Signed)
 Pt called that she is having sinus infection,fever,headache and coughing up yellow mucus since last Thursday she did Covid and flu test Saturday was negative as per lauren sent her zpak and benzonatate ns also advised if her symptoms worse go to urgent care

## 2023-03-30 ENCOUNTER — Encounter: Payer: Self-pay | Admitting: Nurse Practitioner

## 2023-03-30 ENCOUNTER — Telehealth (INDEPENDENT_AMBULATORY_CARE_PROVIDER_SITE_OTHER): Admitting: Nurse Practitioner

## 2023-03-30 VITALS — Ht 64.0 in | Wt 244.0 lb

## 2023-03-30 DIAGNOSIS — R051 Acute cough: Secondary | ICD-10-CM

## 2023-03-30 DIAGNOSIS — J0100 Acute maxillary sinusitis, unspecified: Secondary | ICD-10-CM

## 2023-03-30 MED ORDER — HYDROCOD POLI-CHLORPHE POLI ER 10-8 MG/5ML PO SUER: 5.0000 mL | Freq: Two times a day (BID) | ORAL | 0 refills | Status: DC | PRN

## 2023-03-30 NOTE — Progress Notes (Signed)
 [facility] [address] [place], [place] [address]  Internal MEDICINE  Telephone Visit  Patient Name: Leslie [name]  [id number]  [id number]  Date of Service: 03/30/2023  I connected with the patient at 1015 by telephone and verified the patients identity using two identifiers.   I discussed the limitations, risks, security and privacy concerns of performing an evaluation and management service by telephone and the availability of in person appointments. I also discussed with the patient that there may be a patient responsible charge related to the service.  The patient expressed understanding and agrees to proceed.    Chief Complaint  Patient presents with   Telephone Screen   Telephone Assessment    Covid and flu was negative already on zpak    Cough    Cough med is not working    Sinusitis   Wheezing    Cough Associated symptoms include chills, a fever, myalgias (body aches), postnasal drip, rhinorrhea and a sore throat. Pertinent negatives include no chest pain, shortness of breath or wheezing.  Sinusitis Associated symptoms include chills, congestion, coughing, sinus pressure and a sore throat. Pertinent negatives include no shortness of breath.  Wheezing  Associated symptoms include chills, coughing, a fever, rhinorrhea and a sore throat. Pertinent negatives include no chest pain or shortness of breath.   Leslie [name] presents for a telehealth virtual visit for sinus infection Covid and flu tests were negative She has been taking zpak Benzonatate is not helping with her cough --cough, fever, headache, chills, body aches, sore throat, sinus drainage, sinus pressure, nasal congestion and runny nose.  Has had tussionex before which has helped more than benzonatate has.   Current Medication: Outpatient Encounter Medications as of 03/30/2023  Medication Sig   allopurinol (ZYLOPRIM) 100 MG tablet Take 100 mg by mouth daily.   ALPRAZolam (XANAX) 0.5 MG tablet TAKE 1/2  TABLET BY MOUTH AT BEDTIME AS NEEDED FOR ANXIETY   amLODipine (NORVASC) 5 MG tablet Take 1 tablet (5 mg total) by mouth daily.   aspirin 81 MG EC tablet Take 81 mg by mouth daily.   atorvastatin (LIPITOR) 80 MG tablet Take 1 tablet (80 mg total) by mouth daily.   azithromycin (ZITHROMAX) 250 MG tablet Use as directed for 5 days   benzonatate (TESSALON) 100 MG capsule Take 1 capsule (100 mg total) by mouth 2 (two) times daily as needed for cough.   bisacodyl (DULCOLAX) 5 MG EC tablet One tab at night Monday wed and friday   calcium carbonate (TUMS - DOSED IN MG ELEMENTAL CALCIUM) 500 MG chewable tablet Chew 500 mg by mouth daily as needed for indigestion or heartburn.   chlorpheniramine-HYDROcodone (TUSSIONEX) 10-8 MG/5ML Take 5 mLs by mouth every 12 (twelve) hours as needed.   dapagliflozin propanediol (FARXIGA) 10 MG TABS tablet Take 10 mg by mouth daily.   estradiol (ESTRACE) 0.1 MG/GM vaginal cream 1/2 applicator once a week   famciclovir (FAMVIR) 500 MG tablet TAKE ONE TAB BY MOUTH TWICE A DAY FOR INFECTION   fluconazole (DIFLUCAN) 150 MG tablet Use as directed once a week for 4 weeks   hyoscyamine (LEVBID) 0.375 MG 12 hr tablet Take 1 tablet (0.375 mg total) by mouth 2 (two) times daily.   Insulin Pen Needle 32G X 6 MM MISC Use with victoza   losartan (COZAAR) 50 MG tablet Take 50 mg by mouth daily.   metoprolol succinate (TOPROL-XL) 100 MG 24 hr tablet Take 1 tablet (100 mg total) by mouth in the  morning and at bedtime.   omeprazole (PRILOSEC) 40 MG capsule TAKE 1 CAPSULE (40 MG TOTAL) BY MOUTH DAILY.   ondansetron (ZOFRAN) 8 MG tablet Take 8 mg by mouth every 8 (eight) hours as needed.   ondansetron (ZOFRAN-ODT) 4 MG disintegrating tablet Take 1 tablet (4 mg total) by mouth every 8 (eight) hours as needed for nausea or vomiting.   potassium chloride SA (KLOR-CON M20) 20 MEQ tablet TAKE 2 TABLETS BY MOUTH TWICE A DAY   Semaglutide,0.25 or 0.5MG /DOS, (OZEMPIC, 0.25 OR 0.5 MG/DOSE,) 2 MG/3ML  SOPN Take 0.25 mg first week and then increase to 0.5 mg on 2nd. 3rd and 4th week   spironolactone (ALDACTONE) 25 MG tablet Take 1 tablet (25 mg total) by mouth daily.   torsemide (DEMADEX) 20 MG tablet Take 1 tablet (20 mg total) by mouth 2 (two) times daily.   traZODone (DESYREL) 50 MG tablet TAKE 1 TABLET BY MOUTH EVERYDAY AT BEDTIME   Vitamin D, Ergocalciferol, (DRISDOL) 1.25 MG (50000 UNIT) CAPS capsule TAKE 1 CAPSULE BY MOUTH ONE TIME PER WEEK   warfarin (COUMADIN) 2 MG tablet TAKE 1-2 TABLETS DAILY OR AS PRESCRIBED BY COUMADIN CLINIC   linaclotide (LINZESS) 145 MCG CAPS capsule Take 1 capsule (145 mcg total) by mouth daily before breakfast.   No facility-administered encounter medications on file as of 03/30/2023.    Surgical History: Past Surgical History:  Procedure Laterality Date   ABDOMINAL HYSTERECTOMY     total   ABDOMINAL HYSTERECTOMY     BREAST BIOPSY Bilateral 2012   BREAST BIOPSY Right 08-07-12   fibroadenomatous changes and columnar cells   BREAST BIOPSY  02/03/2015   stereo byrnett   BUBBLE STUDY  04/01/2020   Procedure: BUBBLE STUDY;  Surgeon: Parke Poisson, MD;  Location: [facility] ENDOSCOPY;  Service: Cardiovascular;;   CARDIAC CATHETERIZATION     CHOLECYSTECTOMY N/A 02/27/2016   Procedure: LAPAROSCOPIC CHOLECYSTECTOMY;  Surgeon: Kieth Brightly, MD;  Location: [facility] ORS;  Service: General;  Laterality: N/A;   CLIPPING OF ATRIAL APPENDAGE  04/29/2020   Procedure: CLIPPING OF ATRIAL APPENDAGE USING ATRICURE  PRO2 CLIP SIZE ;  Surgeon: Purcell Nails, MD;  Location: [facility] OR;  Service: Open Heart Surgery;;   COLONOSCOPY WITH PROPOFOL N/A 09/26/2018   Procedure: COLONOSCOPY WITH PROPOFOL;  Surgeon: Midge Minium, MD;  Location: [facility] ENDOSCOPY;  Service: Endoscopy;  Laterality: N/A;   COLONOSCOPY WITH PROPOFOL N/A 07/16/2021   Procedure: COLONOSCOPY WITH PROPOFOL;  Surgeon: Midge Minium, MD;  Location: [facility] ENDOSCOPY;  Service: Endoscopy;  Laterality: N/A;   CYSTOSCOPY  W/ RETROGRADES Right 08/18/2018   Procedure: CYSTOSCOPY WITH RETROGRADE PYELOGRAM;  Surgeon: Sondra Come, MD;  Location: [facility] ORS;  Service: Urology;  Laterality: Right;   CYSTOSCOPY/URETEROSCOPY/HOLMIUM LASER/STENT PLACEMENT Right 08/18/2018   Procedure: CYSTOSCOPY/URETEROSCOPY/STENT PLACEMENT;  Surgeon: Sondra Come, MD;  Location: [facility] ORS;  Service: Urology;  Laterality: Right;   DIAGNOSTIC LAPAROSCOPY     ESOPHAGOGASTRODUODENOSCOPY N/A 07/16/2021   Procedure: ESOPHAGOGASTRODUODENOSCOPY (EGD);  Surgeon: Midge Minium, MD;  Location: [facility] ENDOSCOPY;  Service: Endoscopy;  Laterality: N/A;   ESOPHAGOGASTRODUODENOSCOPY (EGD) WITH PROPOFOL N/A 09/26/2018   Procedure: ESOPHAGOGASTRODUODENOSCOPY (EGD) WITH PROPOFOL;  Surgeon: Midge Minium, MD;  Location: [facility] ENDOSCOPY;  Service: Endoscopy;  Laterality: N/A;   GIVENS CAPSULE STUDY N/A 11/03/2018   Procedure: GIVENS CAPSULE STUDY;  Surgeon: Midge Minium, MD;  Location: [facility] ENDOSCOPY;  Service: Endoscopy;  Laterality: N/A;   JOINT REPLACEMENT Left    knee   KNEE ARTHROPLASTY Right 04/09/2019  Procedure: COMPUTER ASSISTED TOTAL KNEE ARTHROPLASTY;  Surgeon: Donato Heinz, MD;  Location: [facility] ORS;  Service: Orthopedics;  Laterality: Right;   KNEE CLOSED REDUCTION Left 04/15/2015   Procedure: CLOSED MANIPULATION KNEE;  Surgeon: Juanell Fairly, MD;  Location: [facility] ORS;  Service: Orthopedics;  Laterality: Left;   KNEE SURGERY     MAZE N/A 04/29/2020   Procedure: MAZE;  Surgeon: Purcell Nails, MD;  Location: [facility] OR;  Service: Open Heart Surgery;  Laterality: N/A;   MITRAL VALVE REPLACEMENT N/A 04/29/2020   Procedure: MITRAL VALVE (MV) REPLACEMENT USING ON-X VALVE SIZE 27/29MM;  Surgeon: Purcell Nails, MD;  Location: [facility] OR;  Service: Open Heart Surgery;  Laterality: N/A;   MULTIPLE EXTRACTIONS WITH ALVEOLOPLASTY N/A 02/28/2020   Procedure: MULTIPLE EXTRACTION WITH ALVEOLOPLASTY;  Surgeon: Sharman Cheek, DMD;  Location: [facility] OR;  Service:  Dentistry;  Laterality: N/A;   RIGHT/LEFT HEART CATH AND CORONARY ANGIOGRAPHY Bilateral 09/06/2019   Procedure: RIGHT/LEFT HEART CATH AND CORONARY ANGIOGRAPHY;  Surgeon: Antonieta Iba, MD;  Location: [facility] INVASIVE CV LAB;  Service: Cardiovascular;  Laterality: Bilateral;   TEE WITHOUT CARDIOVERSION N/A 09/13/2017   Procedure: TRANSESOPHAGEAL ECHOCARDIOGRAM (TEE);  Surgeon: Yvonne Kendall, MD;  Location: [facility] ORS;  Service: Cardiovascular;  Laterality: N/A;   TEE WITHOUT CARDIOVERSION N/A 04/01/2020   Procedure: TRANSESOPHAGEAL ECHOCARDIOGRAM (TEE);  Surgeon: Parke Poisson, MD;  Location: [facility] ENDOSCOPY;  Service: Cardiovascular;  Laterality: N/A;   TEE WITHOUT CARDIOVERSION N/A 04/29/2020   Procedure: TRANSESOPHAGEAL ECHOCARDIOGRAM (TEE);  Surgeon: Purcell Nails, MD;  Location: [facility] OR;  Service: Open Heart Surgery;  Laterality: N/A;   TOTAL KNEE ARTHROPLASTY Left 12/25/2014   Procedure: TOTAL KNEE ARTHROPLASTY;  Surgeon: Juanell Fairly, MD;  Location: [facility] ORS;  Service: Orthopedics;  Laterality: Left;   TUBAL LIGATION      Medical History: Past Medical History:  Diagnosis Date   (HFpEF) heart failure with preserved ejection fraction (HCC)    a. 08/2017 Echo: EF 55-60%.  Grade 2 diastolic dysfunction; b. 05/2020 Echo: EF 50-55%, no rwma, Nl RV fxn, nl fxn'ing mech MV.   Allergy    Anemia    Anxiety    Asthma    BRCA negative 03/22/2013   Bronchitis 02/19/2016   ON LEVAQUIN PO   Chest tightness    a. 08/2019 Cath: nl cors.   Cigarette smoker 09/11/2017   8-10 day   Dysrhythmia    GERD (gastroesophageal reflux disease)    History of kidney stones    Hyperlipidemia    Hypertension    Interstitial lung disease (HCC)    a. CT 2013 b. 02/2018 CXR noted recurrent intersistial changes ILD vs chronic bronchitis   Moderate mitral stenosis    a.  08/2017 TEE: EF 60 to 65%.  Moderate mitral stenosis.  Mean gradient 14 mmHg.  Valve area 2.59 cm by planimetry, 2.72 cm by pressure  half-time.   PAF (paroxysmal atrial fibrillation) (HCC)    a. 08/2017 s/p TEE/DCCV; b. CHA2DS2VASc = 2-->warfarin.   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 04/2020 s/p 27/29 mm Onyx mech mitral valve-->chronic coumadin; b. 05/2020 Echo: Nl fxn'ing mech MV.    Family History: Family History  Problem Relation Age of Onset   Breast cancer Mother    Cancer Mother  61       breast   Hypertension Mother    Osteoarthritis Father    Hypertension Father    Cancer Maternal Aunt        breast   Breast cancer Maternal Grandmother    Cancer Maternal Grandmother        breast    Social History   Socioeconomic History   Marital status: Married    Spouse name: Not on file   Number of children: Not on file   Years of education: Not on file   Highest education level: Not on file  Occupational History   Not on file  Tobacco Use   Smoking status: Former    Current packs/day: 0.00    Average packs/day: 0.5 packs/day for 20.0 years (10.0 ttl pk-yrs)    Types: Cigarettes    Start date: 01/1996    Quit date: 01/2016    Years since quitting: 7.1   Smokeless tobacco: Never  Vaping Use   Vaping status: Never Used  Substance and Sexual Activity   Alcohol use: Not Currently    Comment: rarely   Drug use: No   Sexual activity: Not on file  Other Topics Concern   Not on file  Social History Narrative   Not on file   Social Drivers of Health   Financial Resource Strain: Medium Risk (03/11/2023)   Received from [facility]   Overall Financial Resource Strain (CARDIA)    Difficulty of Paying Living Expenses: Somewhat hard  Food Insecurity: Food Insecurity Present (03/11/2023)   Received from [facility]   Hunger Vital Sign     Worried About Running Out of Food in the Last Year: Sometimes true    Ran Out of Food in the Last Year: Sometimes true  Transportation Needs: No Transportation Needs (03/11/2023)   Received from [facility]   PRAPARE - Transportation    In the past 12 months, has lack of transportation kept you from medical appointments or from getting medications?: No    Lack of Transportation (Non-Medical): No  Physical Activity: Not on file  Stress: Not on file  Social Connections: Not on file  Intimate Partner Violence: Not At Risk (11/16/2021)   Humiliation, Afraid, Rape, and Kick questionnaire    Fear of Current or Ex-Partner: No    Emotionally Abused: No    Physically Abused: No    Sexually Abused: No      Review of Systems  Constitutional:  Positive for chills, fatigue and fever.  HENT:  Positive for congestion, postnasal drip, rhinorrhea, sinus pressure, sinus pain and sore throat.   Respiratory:  Positive for cough. Negative for chest tightness, shortness of breath and wheezing.   Cardiovascular: Negative.  Negative for chest pain and palpitations.  Gastrointestinal: Negative.   Musculoskeletal:  Positive for myalgias (body aches).    Vital Signs: Ht 5\' 4"  (1.626 m)   Wt 244 lb (110.7 kg)   BMI 41.88 kg/m    Observation/Objective: She is alert and oriented. No acute distress noted.     Assessment/Plan: 1. Acute non-recurrent maxillary sinusitis (Primary) Finish zpak as prescribed. May take tussionex cough syrup every 12 hours as needed for cough - chlorpheniramine-HYDROcodone (TUSSIONEX) 10-8 MG/5ML; Take 5 mLs by mouth every 12 (twelve) hours as needed.  Dispense: 140 mL; Refill: 0  2. Acute cough Cough syrup prescribed for symptom relief.  - chlorpheniramine-HYDROcodone (TUSSIONEX) 10-8 MG/5ML; Take 5 mLs by mouth every 12 (twelve) hours  as needed.  Dispense: 140 mL; Refill: 0   General Counseling: Camry verbalizes understanding of the findings of  today's phone visit and agrees with plan of treatment. I have discussed any further diagnostic evaluation that may be needed or ordered today. We also reviewed her medications today. she has been encouraged to call the office with any questions or concerns that should arise related to todays visit.  Return if symptoms worsen or fail to improve.   No orders of the defined types were placed in this encounter.   Meds ordered this encounter  Medications   chlorpheniramine-HYDROcodone (TUSSIONEX) 10-8 MG/5ML    Sig: Take 5 mLs by mouth every 12 (twelve) hours as needed.    Dispense:  140 mL    Refill:  0    Fill new script today    Time spent:10 Minutes Time spent with patient included reviewing progress notes, labs, imaging studies, and discussing plan for follow up.  [place] Controlled Substance Database was reviewed by me for overdose risk score (ORS) if appropriate.  This patient was seen by Sallyanne Kuster, FNP-C in collaboration with Dr. Beverely Risen as a part of collaborative care agreement.  Garland Hincapie R. Tedd Sias, MSN, FNP-C Internal medicine

## 2023-04-05 ENCOUNTER — Telehealth: Payer: Self-pay

## 2023-04-05 NOTE — Telephone Encounter (Signed)
 Pt advised that her Ozempic sample we received ready for pickup

## 2023-04-08 ENCOUNTER — Encounter: Payer: Self-pay | Admitting: Medical

## 2023-04-08 ENCOUNTER — Ambulatory Visit: Payer: Medicare Other | Attending: Medical | Admitting: Medical

## 2023-04-08 VITALS — BP 125/67 | HR 64 | Ht 64.0 in | Wt 240.4 lb

## 2023-04-08 DIAGNOSIS — I480 Paroxysmal atrial fibrillation: Secondary | ICD-10-CM | POA: Diagnosis not present

## 2023-04-08 DIAGNOSIS — R0609 Other forms of dyspnea: Secondary | ICD-10-CM | POA: Diagnosis not present

## 2023-04-08 DIAGNOSIS — I5032 Chronic diastolic (congestive) heart failure: Secondary | ICD-10-CM

## 2023-04-08 DIAGNOSIS — R0602 Shortness of breath: Secondary | ICD-10-CM

## 2023-04-08 DIAGNOSIS — Z954 Presence of other heart-valve replacement: Secondary | ICD-10-CM | POA: Diagnosis not present

## 2023-04-08 DIAGNOSIS — R5383 Other fatigue: Secondary | ICD-10-CM

## 2023-04-08 NOTE — Progress Notes (Signed)
 Cardiology Office Note:  .   Date:  04/21/2023  ID:  Leslie [name], DOB 1965-11-01, MRN [id number] PCP: Alan Ripper  [facility] Providers Cardiologist:  Julien Nordmann, MD {  History of Present Illness: .   Leslie [name] is a 58 y.o. female with a hx of  mitral valve stenosis s/p MVR with concomitant MAZE procedure, morbid obesity, HLD, COPD who presents for follow-up for chest pain.    The patient was hospitalized in 08/2017 for chest tightness. She was found to be in afib RVR and underwent TEE that showed moderate mitral stenosis and she was cardioverted. Cardiac CTA  in 2020 for chest tightness showed nonobstructive CAD. She had progressive mitral stenosis on serial echocardiograms with severe mitral stenosis on echo in 06/2019. R/L heart cath showed moderate pulmonary HTN with no significant coronary disease. Patient was referred to Dr. Cornelius Moras of CT surgery and and she underwent mitral valve replacement with ON-X bileaflet mechanical valve with MAZE procedure in 04/2020. She was started on coumadin post-procedure. Follow-up echo 05/2020 showed EF 50-55% with no MV regurgitation. Heart monitor showed NSR, average HR 77bpm, 7 runs of SVT, longest lasting 6 beats, no afib.    Echo in 2023 showed well functioning valve.    The patient was seen 12/06/22 reporting chest and back pain. PET stress test showed small perfusion defect related to apical thinning, cannot exclude mild apical ischemia. Defect 1: small defect with mild reduction in uptake present in the apical anterior and lateral location that is reversible. There is normal wall motion, consistent with artifact. End diastolic cavity was normal. Coronary calcium present on attenuation correction CT images, mild coronary calcifications present, mitral valve replacement.   The patient was last seen December 2024 reporting she had to reschedule the echo due to GI issues.  She reported generalized fatigue over the last few months  with minimal shortness of breath and chest pain. Plan to wait until echo.  Today, echo was reviewed. She reports low BP and HR, however today vitals are normal. She had the Flu a couple weeks ago. She is feeling better now. She has SOB when she walks. Still has chronic fatigue and sleeps easily during the day. She denies h/o OSA. She reported episode of palpitations. No chest pain, lower leg edema.    Studies Reviewed: .        Echo 01/2023 1. Left ventricular ejection fraction, by estimation, is 60 to 65%. The  left ventricle has normal function. The left ventricle has no regional  wall motion abnormalities. Left ventricular diastolic parameters are  indeterminate. The average left  ventricular global longitudinal strain is -17.3 %. The global longitudinal  strain is normal.   2. Right ventricular systolic function is normal. The right ventricular  size is normal.   3. The mitral valve has been repaired/replaced. No evidence of mitral  valve regurgitation. The mean mitral valve gradient is 5.0 mmHg. There is  a bioprosthetic valve present in the mitral position.   4. The aortic valve was not well visualized. Aortic valve regurgitation  is not visualized.   5. The inferior vena cava is normal in size with greater than 50%  respiratory variability, suggesting right atrial pressure of 3 mmHg.    Cardiac PET stress 12/2022   Findings are consistent with no infarction. The study is intermediate risk.  Likely, small perfusion defect is related to apical thinning, as stress flow is normal.  Cannot exclude mild apical ischemia.  LV perfusion is abnormal. Defect 1: There is a small defect with mild reduction in uptake present in the apical anterior and lateral location(s) that is reversible. There is normal wall motion in the defect area. Consistent with artifact.   End diastolic cavity size is normal. End systolic cavity size is normal.   Myocardial blood flow was computed to be 0.52ml/g/min  at rest and 2.68ml/g/min at stress. Global myocardial blood flow reserve was 2.07 and was normal.   Coronary calcium was present on the attenuation correction CT images. Mild coronary calcifications were present. Mitral valve replacement noted with post procedural pericardial calcification.  Aortic atherosclerosis noted. Coronary calcifications were present in the left anterior descending artery and right coronary artery distribution(s).   Electronically Signed  By: Riley Lam M.D.       Physical Exam:   VS:  BP 125/67 (BP Location: Left Arm, Patient Position: Sitting, Cuff Size: Normal)   Pulse 64   Ht 5\' 4"  (1.626 m)   Wt 240 lb 6.4 oz (109 kg)   SpO2 98%   BMI 41.26 kg/m    Wt Readings from Last 3 Encounters:  04/08/23 240 lb 6.4 oz (109 kg)  03/30/23 244 lb (110.7 kg)  03/08/23 249 lb 12.8 oz (113.3 kg)    GEN: Well nourished, well developed in no acute distress NECK: No JVD; No carotid bruits CARDIAC: RRR, + murmur, no rubs, gallops RESPIRATORY:  Clear to auscultation without rales, wheezing or rhonchi  ABDOMEN: Soft, non-tender, non-distended EXTREMITIES:  No edema; No deformity   ASSESSMENT AND PLAN: .    Generalized fatigue DOE Patient reports persistent generalized fatigue and DOE. LHC in 2021 showed no significant CAD and moderate pulmonary HTN. Cardiac stress test was intermediate risk with possible areas of ischemia vs artifact. CT correction imaging showed mild coronary calcifications. Echo was unremarkable. She is also seeing PCP for non-cardiac issues. She has smoking history, so I will refer to pulmonology.   S/p MVR Echo showed normal LVEF with normally functioning valve with mean gradient of . Continue coumadin.   Pafib s/p MAZE In NSR on exam. Continue coumadin for stroke oox  Chronic diastolic heart failure with moderate pulmonary HTN The patient is euvolemic on exam. Continue Torsemide, spironolactone and potassium. Echo showed normal LVEF  60-65%, no WMA, normal RVSF.     Dispo: Follow-up in 3 months  Signed, Crist Kruszka David Stall, PA-C

## 2023-04-08 NOTE — Patient Instructions (Signed)
 Medication Instructions:  Your physician recommends that you continue on your current medications as directed. Please refer to the Current Medication list given to you today.   *If you need a refill on your cardiac medications before your next appointment, please call your pharmacy*   Lab Work: No labs ordered today    Testing/Procedures: No test ordered today    Follow-Up: At [facility], you and your health needs are our priority.  As part of our continuing mission to provide you with exceptional heart care, we have created designated Provider Care Teams.  These Care Teams include your primary Cardiologist (physician) and Advanced Practice Providers (APPs -  Physician Assistants and Nurse Practitioners) who all work together to provide you with the care you need, when you need it.  We recommend signing up for the patient portal called "MyChart".  Sign up information is provided on this After Visit Summary.  MyChart is used to connect with patients for Virtual Visits (Telemedicine).  Patients are able to view lab/test results, encounter notes, upcoming appointments, etc.  Non-urgent messages can be sent to your provider as well.   To learn more about what you can do with MyChart, go to [email or web address].    Your next appointment:   3 month(s)  Provider:   You may see Julien Nordmann, MD or one of the following Advanced Practice Providers on your designated Care Team:   Nicolasa Ducking, NP Eula Listen, PA-C Cadence Fransico Michael, PA-C Charlsie Quest, NP Carlos Levering, NP

## 2023-04-25 ENCOUNTER — Other Ambulatory Visit: Payer: Self-pay | Admitting: Physician Assistant

## 2023-04-26 ENCOUNTER — Encounter: Payer: Self-pay | Admitting: Nurse Practitioner

## 2023-04-26 ENCOUNTER — Ambulatory Visit (INDEPENDENT_AMBULATORY_CARE_PROVIDER_SITE_OTHER): Admitting: Nurse Practitioner

## 2023-04-26 VITALS — BP 130/74 | HR 95 | Temp 98.3°F | Resp 16 | Ht 64.0 in | Wt 238.0 lb

## 2023-04-26 DIAGNOSIS — R319 Hematuria, unspecified: Secondary | ICD-10-CM | POA: Diagnosis not present

## 2023-04-26 DIAGNOSIS — N3001 Acute cystitis with hematuria: Secondary | ICD-10-CM

## 2023-04-26 DIAGNOSIS — R300 Dysuria: Secondary | ICD-10-CM | POA: Diagnosis not present

## 2023-04-26 DIAGNOSIS — N390 Urinary tract infection, site not specified: Secondary | ICD-10-CM | POA: Diagnosis not present

## 2023-04-26 LAB — POCT URINALYSIS DIPSTICK
Bilirubin, UA: NEGATIVE
Glucose, UA: POSITIVE — AB
Ketones, UA: NEGATIVE
Leukocytes, UA: NEGATIVE
Nitrite, UA: NEGATIVE
Protein, UA: NEGATIVE
Spec Grav, UA: <=1.005 — AB (ref 1.010–1.025)
Urobilinogen, UA: 0.2 U/dL
pH, UA: 6 (ref 5.0–8.0)

## 2023-04-26 MED ORDER — SULFAMETHOXAZOLE-TRIMETHOPRIM 800-160 MG PO TABS: 1.0000 | ORAL_TABLET | Freq: Two times a day (BID) | ORAL | 0 refills | Status: AC

## 2023-04-26 MED ORDER — ACETAMINOPHEN-CODEINE 300-30 MG PO TABS: 1.0000 | ORAL_TABLET | Freq: Four times a day (QID) | ORAL | 0 refills | Status: DC | PRN

## 2023-04-26 MED ORDER — PHENAZOPYRIDINE HCL 200 MG PO TABS: 200.0000 mg | ORAL_TABLET | Freq: Three times a day (TID) | ORAL | 0 refills | Status: DC | PRN

## 2023-04-26 NOTE — Progress Notes (Signed)
 [facility] [address] [place], [place] [address]  Internal MEDICINE  Office Visit Note  Patient Name: Leslie [name]  [id number]  [id number]  Date of Service: 04/26/2023  Chief Complaint  Patient presents with   Acute Visit    uti     HPI [name] presents for an acute sick visit for symptoms of UTI --onset of symptoms was about 4 days ago -- reports suprapubic tenderness and pain, back pain, pain with urination, urgency, frequency, urinary discomfort, cloudy urine.  Urinalysis is positive for trace blood, negative for leukocytes or nitrites.   Also FYI, patient has a kidney stone on the right side     Current Medication:  Outpatient Encounter Medications as of 04/26/2023  Medication Sig   acetaminophen-codeine (TYLENOL #3) 300-30 MG tablet Take 1 tablet by mouth every 6 (six) hours as needed for severe pain (pain score 7-10).   allopurinol (ZYLOPRIM) 100 MG tablet Take 100 mg by mouth daily.   ALPRAZolam (XANAX) 0.5 MG tablet TAKE 1/2 TABLET BY MOUTH AT BEDTIME AS NEEDED FOR ANXIETY   amLODipine (NORVASC) 5 MG tablet Take 1 tablet (5 mg total) by mouth daily.   aspirin 81 MG EC tablet Take 81 mg by mouth daily.   atorvastatin (LIPITOR) 80 MG tablet Take 1 tablet (80 mg total) by mouth daily.   benzonatate (TESSALON) 100 MG capsule Take 1 capsule (100 mg total) by mouth 2 (two) times daily as needed for cough.   bisacodyl (DULCOLAX) 5 MG EC tablet One tab at night Monday wed and friday   calcium carbonate (TUMS - DOSED IN MG ELEMENTAL CALCIUM) 500 MG chewable tablet Chew 500 mg by mouth daily as needed for indigestion or heartburn.   chlorpheniramine-HYDROcodone (TUSSIONEX) 10-8 MG/5ML Take 5 mLs by mouth every 12 (twelve) hours as needed.   dapagliflozin propanediol (FARXIGA) 10 MG TABS tablet Take 10 mg by mouth daily.   estradiol (ESTRACE) 0.1 MG/GM vaginal cream 1/2 applicator once a week   famciclovir (FAMVIR) 500 MG tablet TAKE ONE TAB BY MOUTH TWICE A DAY  FOR INFECTION   hyoscyamine (LEVBID) 0.375 MG 12 hr tablet Take 1 tablet (0.375 mg total) by mouth 2 (two) times daily.   Insulin Pen Needle 32G X 6 MM MISC Use with victoza   KLOR-CON M20 20 MEQ tablet TAKE 2 TABLETS BY MOUTH TWICE A DAY   metoprolol succinate (TOPROL-XL) 100 MG 24 hr tablet Take 1 tablet (100 mg total) by mouth in the morning and at bedtime.   omeprazole (PRILOSEC) 40 MG capsule TAKE 1 CAPSULE (40 MG TOTAL) BY MOUTH DAILY.   phenazopyridine (PYRIDIUM) 200 MG tablet Take 1 tablet (200 mg total) by mouth 3 (three) times daily as needed for pain.   Semaglutide,0.25 or 0.5MG /DOS, (OZEMPIC, 0.25 OR 0.5 MG/DOSE,) 2 MG/3ML SOPN Take 0.25 mg first week and then increase to 0.5 mg on 2nd. 3rd and 4th week   spironolactone (ALDACTONE) 25 MG tablet Take 1 tablet (25 mg total) by mouth daily.   sulfamethoxazole-trimethoprim (BACTRIM DS) 800-160 MG tablet Take 1 tablet by mouth 2 (two) times daily for 7 days. Take with food   torsemide (DEMADEX) 20 MG tablet Take 1 tablet (20 mg total) by mouth 2 (two) times daily.   traZODone (DESYREL) 50 MG tablet TAKE 1 TABLET BY MOUTH EVERYDAY AT BEDTIME   Vitamin D, Ergocalciferol, (DRISDOL) 1.25 MG (50000 UNIT) CAPS capsule TAKE 1 CAPSULE BY MOUTH ONE TIME PER WEEK   warfarin (COUMADIN) 2  MG tablet TAKE 1-2 TABLETS DAILY OR AS PRESCRIBED BY COUMADIN CLINIC   [DISCONTINUED] azithromycin (ZITHROMAX) 250 MG tablet Use as directed for 5 days   [DISCONTINUED] fluconazole (DIFLUCAN) 150 MG tablet Use as directed once a week for 4 weeks   [DISCONTINUED] ondansetron (ZOFRAN) 8 MG tablet Take 8 mg by mouth every 8 (eight) hours as needed.   [DISCONTINUED] ondansetron (ZOFRAN-ODT) 4 MG disintegrating tablet Take 1 tablet (4 mg total) by mouth every 8 (eight) hours as needed for nausea or vomiting.   linaclotide (LINZESS) 145 MCG CAPS capsule Take 1 capsule (145 mcg total) by mouth daily before breakfast.   losartan (COZAAR) 50 MG tablet Take 50 mg by mouth  daily. (Patient not taking: Reported on 04/08/2023)   No facility-administered encounter medications on file as of 04/26/2023.      Medical History: Past Medical History:  Diagnosis Date   (HFpEF) heart failure with preserved ejection fraction (HCC)    a. 08/2017 Echo: EF 55-60%.  Grade 2 diastolic dysfunction; b. 05/2020 Echo: EF 50-55%, no rwma, Nl RV fxn, nl fxn'ing mech MV.   Allergy    Anemia    Anxiety    Asthma    BRCA negative 03/22/2013   Bronchitis 02/19/2016   ON LEVAQUIN PO   Chest tightness    a. 08/2019 Cath: nl cors.   Cigarette smoker 09/11/2017   8-10 day   Dysrhythmia    GERD (gastroesophageal reflux disease)    History of kidney stones    Hyperlipidemia    Hypertension    Interstitial lung disease (HCC)    a. CT 2013 b. 02/2018 CXR noted recurrent intersistial changes ILD vs chronic bronchitis   Moderate mitral stenosis    a.  08/2017 TEE: EF 60 to 65%.  Moderate mitral stenosis.  Mean gradient 14 mmHg.  Valve area 2.59 cm by planimetry, 2.72 cm by pressure half-time.   PAF (paroxysmal atrial fibrillation) (HCC)    a. 08/2017 s/p TEE/DCCV; b. CHA2DS2VASc = 2-->warfarin.   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 04/2020 s/p 27/29 mm Onyx mech mitral valve-->chronic coumadin; b. 05/2020 Echo: Nl fxn'ing mech MV.     Vital Signs: BP 130/74   Pulse 95   Temp 98.3 F (36.8 C)   Resp 16   Ht 5\' 4"  (1.626 m)   Wt 238 lb (108 kg)   SpO2 99%   BMI 40.85 kg/m    Review of Systems  Constitutional:  Positive for fatigue.  Respiratory: Negative.  Negative for cough, chest tightness, shortness of breath and wheezing.   Cardiovascular: Negative.  Negative for chest pain and palpitations.  Genitourinary:  Positive for difficulty  urinating, dysuria, flank pain, frequency, hematuria, pelvic pain and urgency.  Musculoskeletal:  Positive for back pain.    Physical Exam Vitals reviewed.  Constitutional:      General: She is not in acute distress.    Appearance: Normal appearance. She is obese. She is not ill-appearing.  HENT:     Head: Normocephalic and atraumatic.  Eyes:     Pupils: Pupils are equal, round, and reactive to light.  Cardiovascular:     Rate and Rhythm: Normal rate and regular rhythm.  Pulmonary:     Effort: Pulmonary effort is normal. No respiratory distress.  Abdominal:     Tenderness: There is abdominal tenderness in the suprapubic area.  Neurological:     Mental Status: She is alert and oriented to person, place, and time.  Psychiatric:        Mood and Affect: Mood normal.        Behavior: Behavior normal.       Assessment/Plan: 1. Acute cystitis with hematuria (Primary) Urine culture sent. Take bactrim twice daily for 7 days. Medication for bladder spasm pain and back pain prescribed as well per patient request.  - CULTURE, URINE COMPREHENSIVE - sulfamethoxazole-trimethoprim (BACTRIM DS) 800-160 MG tablet; Take 1 tablet by mouth 2 (two) times daily for 7 days. Take with food  Dispense: 14 tablet; Refill: 0 - phenazopyridine (PYRIDIUM) 200 MG tablet; Take 1 tablet (200 mg total) by mouth 3 (three) times daily as needed for pain.  Dispense: 15 tablet; Refill: 0 - acetaminophen-codeine (TYLENOL #3) 300-30 MG tablet; Take 1 tablet by mouth every 6 (six) hours as needed for severe pain (pain score 7-10).  Dispense: 20 tablet; Refill: 0  2. Dysuria Urinalysis done, positive for trace blood, urine sent for culture. Pyridium prescribed for bladder spasm pain - POCT urinalysis dipstick - phenazopyridine (PYRIDIUM) 200 MG tablet; Take 1 tablet (200 mg total) by mouth 3 (three) times daily as needed for pain.  Dispense: 15 tablet; Refill: 0   General Counseling: Yesli verbalizes understanding  of the findings of todays visit and agrees with plan of treatment. I have discussed any further diagnostic evaluation that may be needed or ordered today. We also reviewed her medications today. she has been encouraged to call the office with any questions or concerns that should arise related to todays visit.    Counseling:    Orders Placed This Encounter  Procedures   CULTURE, URINE COMPREHENSIVE   POCT urinalysis dipstick    Meds ordered this encounter  Medications   sulfamethoxazole-trimethoprim (BACTRIM DS) 800-160 MG tablet    Sig: Take 1 tablet by mouth 2 (two) times daily for 7 days. Take with food    Dispense:  14 tablet    Refill:  0    Fill new script today   phenazopyridine (PYRIDIUM) 200 MG tablet    Sig: Take 1 tablet (200 mg total) by mouth 3 (three) times daily as needed for pain.    Dispense:  15 tablet    Refill:  0    Fill new script today   acetaminophen-codeine (TYLENOL #3) 300-30 MG tablet    Sig: Take 1 tablet by mouth every 6 (six) hours as needed for severe pain (pain score 7-10).    Dispense:  20 tablet    Refill:  0    Fill new script today    Return if symptoms worsen or fail to improve.  [place] Controlled Substance Database was reviewed by me for overdose risk score (ORS)  Time spent:20 Minutes Time spent with patient included reviewing progress notes, labs, imaging studies, and discussing plan for follow up.   This patient was seen by Sallyanne Kuster, FNP-C in collaboration with Dr. Beverely Risen as a part of collaborative care agreement.  Celinda Dethlefs R. Tedd Sias, MSN, FNP-C Internal Medicine

## 2023-04-27 ENCOUNTER — Ambulatory Visit: Payer: Medicare Other | Attending: Cardiovascular Disease

## 2023-04-27 DIAGNOSIS — I4891 Unspecified atrial fibrillation: Secondary | ICD-10-CM | POA: Diagnosis not present

## 2023-04-27 DIAGNOSIS — Z5181 Encounter for therapeutic drug level monitoring: Secondary | ICD-10-CM | POA: Diagnosis not present

## 2023-04-27 DIAGNOSIS — I050 Rheumatic mitral stenosis: Secondary | ICD-10-CM | POA: Diagnosis not present

## 2023-04-27 LAB — POCT INR: INR: 3.5 — AB (ref 2.0–3.0)

## 2023-04-27 NOTE — Patient Instructions (Signed)
 continue 1 tablet daily except 1/2 tablet on Mondays and Fridays. Stay consistent with greens.  - Recheck INR in 3 weeks [phone]  Bactrim 7 days 4/8-15  Take 0.5 tablet while on Bactrim

## 2023-04-28 ENCOUNTER — Other Ambulatory Visit: Payer: Self-pay | Admitting: Physician Assistant

## 2023-04-29 ENCOUNTER — Other Ambulatory Visit: Payer: Self-pay | Admitting: Cardiovascular Disease

## 2023-04-29 LAB — CULTURE, URINE COMPREHENSIVE

## 2023-05-02 ENCOUNTER — Telehealth: Payer: Self-pay | Admitting: Pharmacy Technician

## 2023-05-02 ENCOUNTER — Other Ambulatory Visit (HOSPITAL_COMMUNITY): Payer: Self-pay

## 2023-05-02 ENCOUNTER — Encounter: Payer: Self-pay | Admitting: Internal Medicine

## 2023-05-04 ENCOUNTER — Encounter: Payer: Self-pay | Admitting: Internal Medicine

## 2023-05-07 ENCOUNTER — Other Ambulatory Visit: Payer: Self-pay | Admitting: Internal Medicine

## 2023-05-09 DIAGNOSIS — G5622 Lesion of ulnar nerve, left upper limb: Secondary | ICD-10-CM | POA: Diagnosis not present

## 2023-05-09 DIAGNOSIS — R200 Anesthesia of skin: Secondary | ICD-10-CM | POA: Diagnosis not present

## 2023-05-10 ENCOUNTER — Ambulatory Visit: Admitting: Pulmonary Disease

## 2023-05-10 ENCOUNTER — Encounter: Payer: Self-pay | Admitting: Pulmonary Disease

## 2023-05-10 VITALS — BP 112/60 | HR 68 | Temp 97.6°F | Ht 64.0 in | Wt 233.4 lb

## 2023-05-10 DIAGNOSIS — Z9189 Other specified personal risk factors, not elsewhere classified: Secondary | ICD-10-CM

## 2023-05-10 DIAGNOSIS — R0602 Shortness of breath: Secondary | ICD-10-CM

## 2023-05-10 DIAGNOSIS — Z952 Presence of prosthetic heart valve: Secondary | ICD-10-CM | POA: Diagnosis not present

## 2023-05-10 DIAGNOSIS — J45909 Unspecified asthma, uncomplicated: Secondary | ICD-10-CM | POA: Diagnosis not present

## 2023-05-10 LAB — NITRIC OXIDE

## 2023-05-10 MED ORDER — TRELEGY ELLIPTA 100-62.5-25 MCG/ACT IN AEPB: 1.0000 | INHALATION_SPRAY | Freq: Every day | RESPIRATORY_TRACT | 0 refills | Status: DC

## 2023-05-10 NOTE — Patient Instructions (Signed)
 VISIT SUMMARY:  You are a 58 year old female with a history of asthma and mitral valve replacement who came in today due to shortness of breath. You have been experiencing shortness of breath even at rest, along with occasional wheezing and coughing. You quit smoking in 2019 after a heart attack, and your asthma symptoms have persisted since your mitral valve replacement in 2021. Your companion has also noted that you sometimes appear to stop breathing during sleep.  YOUR PLAN:  -ASTHMA: Asthma is a condition where your airways narrow and swell, producing extra mucus, which can make breathing difficult. You will start using a Trelegy inhaler, one puff daily, and remember to rinse your mouth after each use. We have provided you with a one-month trial of Trelegy samples.  Please let us  know how you do with this inhaler so we can call prescription in for you.  Additionally, we will schedule pulmonary function tests to assess your lung function. Please follow up in 6-8 weeks to see how you are responding to the treatment.  -POTENTIAL SLEEP APNEA: You have some symptoms that may indicate that you have sleep apnea.  We will schedule a home sleep study to evaluate for this potential.  -SMOKING CESSATION: You quit smoking in 2019 after a heart attack. Your history of smoking may still be contributing to your current respiratory symptoms. No new treatment is needed for this issue at this time.  -MITRAL VALVE REPLACEMENT: You had a mitral valve replacement in 2021, and there are no current issues related to the valve.  -MYOCARDIAL INFARCTION: You had a heart attack before you quit smoking in 2019. You are not experiencing any current cardiac symptoms.  INSTRUCTIONS:  Please follow up in 6-8 weeks to assess your response to the asthma treatment. We will also conduct a home sleep study to evaluate for sleep apnea and schedule pulmonary function tests to assess your lung function.

## 2023-05-10 NOTE — Progress Notes (Signed)
 Subjective:    Patient ID: Leslie [name], female    DOB: [birth year], 58 y.o.   MRN: [id number]  Patient Care Team: Villa Greaser as PCP - General (Physician Assistant) Devorah Fonder, MD as PCP - Cardiology (Cardiology) Jerlean Mood, MD (General Surgery)  Chief Complaint  Patient presents with   Consult    Shortness of breath on exertion and at rest. Occasional cough. No wheezing.    BACKGROUND: 58 year old former smoker with a complex medical history as noted below, who presents for evaluation of shortness of breath on exertion.  She is referred by Cadence Stana Ear, primary provider is Taylor Favia, PA-C.   HPI Discussed the use of AI scribe software for clinical note transcription with the patient, who gave verbal consent to proceed.  History of Present Illness   Leslie [name] is a 58 year old female with asthma and a history of mitral valve replacement who presents with shortness of breath.  She presents today with her spouse, Ethelle Herb.  She experiences shortness of breath even at rest, which has persisted since before her mitral valve replacement surgery in 2021. She sometimes experiences wheezing, which is relieved by using an albuterol  inhaler as needed.  She has a history of smoking but quit in 2019 following a heart attack. She was asthmatic as a child and wonders if her asthma as having 'kind of come back.'  No chest pain is reported, but she has occasional coughing, which does not typically produce sputum unless she is ill. She recently recovered from an upper respiratory infection about a month and a half ago, during which she produced yellowish-green sputum.  Her spouse notes that she does not snore but sometimes appears to stop breathing during sleep, which causes concern.      Review of Systems A 10 point review of systems was performed and it is as noted above otherwise negative.   Past Medical History:  Diagnosis Date   (HFpEF)  heart failure with preserved ejection fraction (HCC)    a. 08/2017 Echo: EF 55-60%.  Grade 2 diastolic dysfunction; b. 05/2020 Echo: EF 50-55%, no rwma, Nl RV fxn, nl fxn'ing mech MV.   Allergy    Anemia    Anxiety    Asthma    BRCA negative 03/22/2013   Bronchitis 02/19/2016   ON LEVAQUIN  PO   Chest tightness    a. 08/2019 Cath: nl cors.   Cigarette smoker 09/11/2017   8-10 day   Dysrhythmia    GERD (gastroesophageal reflux disease)    History of kidney stones    Hyperlipidemia    Hypertension    Interstitial lung disease (HCC)    a. CT 2013 b. 02/2018 CXR noted recurrent intersistial changes ILD vs chronic bronchitis   Moderate mitral stenosis    a.  08/2017 TEE: EF 60 to 65%.  Moderate mitral stenosis.  Mean gradient 14 mmHg.  Valve area 2.59 cm by planimetry, 2.72 cm by pressure half-time.   PAF (paroxysmal atrial fibrillation) (HCC)    a. 08/2017 s/p TEE/DCCV; b. CHA2DS2VASc = 2-->warfarin.   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 04/2020 s/p 27/29 mm Onyx mech mitral valve-->chronic coumadin ; b. 05/2020 Echo: Nl fxn'ing mech MV.    Past Surgical History:  Procedure Laterality Date   ABDOMINAL HYSTERECTOMY     total   ABDOMINAL HYSTERECTOMY     BREAST BIOPSY Bilateral 2012   BREAST BIOPSY Right 08-07-12   fibroadenomatous changes and columnar cells   BREAST BIOPSY  02/03/2015   stereo byrnett   BUBBLE STUDY  04/01/2020   Procedure: BUBBLE STUDY;  Surgeon: Euell Herrlich, MD;  Location: [facility];  Service: Cardiovascular;;   CARDIAC CATHETERIZATION     CHOLECYSTECTOMY N/A 02/27/2016   Procedure: LAPAROSCOPIC CHOLECYSTECTOMY;  Surgeon: Jerlean Mood, MD;  Location: [facility] ORS;  Service: General;  Laterality: N/A;   CLIPPING OF ATRIAL  APPENDAGE  04/29/2020   Procedure: CLIPPING OF ATRIAL APPENDAGE USING ATRICURE  PRO2 CLIP SIZE ;  Surgeon: Gardenia Jump, MD;  Location[facility] OR;  Service: Open Heart Surgery;;   COLONOSCOPY WITH PROPOFOL  N/A 09/26/2018   Procedure: COLONOSCOPY WITH PROPOFOL ;  Surgeon: Marnee Sink, MD;  Location: [facility] ENDOSCOPY;  Service: Endoscopy;  Laterality: N/A;   COLONOSCOPY WITH PROPOFOL  N/A 07/16/2021   Procedure: COLONOSCOPY WITH PROPOFOL ;  Surgeon: Marnee Sink, MD;  Location: [facility] ENDOSCOPY;  Service: Endoscopy;  Laterality: N/A;   CYSTOSCOPY W/ RETROGRADES Right 08/18/2018   Procedure: CYSTOSCOPY WITH RETROGRADE PYELOGRAM;  Surgeon: Lawerence Pressman, MD;  Location: [facility] ORS;  Service: Urology;  Laterality: Right;   CYSTOSCOPY/URETEROSCOPY/HOLMIUM LASER/STENT PLACEMENT Right 08/18/2018   Procedure: CYSTOSCOPY/URETEROSCOPY/STENT PLACEMENT;  Surgeon: Lawerence Pressman, MD;  Location: [facility] ORS;  Service: Urology;  Laterality: Right;   DIAGNOSTIC LAPAROSCOPY     ESOPHAGOGASTRODUODENOSCOPY N/A 07/16/2021   Procedure: ESOPHAGOGASTRODUODENOSCOPY (EGD);  Surgeon: Marnee Sink, MD;  Location: [facility] ENDOSCOPY;  Service: Endoscopy;  Laterality: N/A;   ESOPHAGOGASTRODUODENOSCOPY (EGD) WITH PROPOFOL  N/A 09/26/2018   Procedure: ESOPHAGOGASTRODUODENOSCOPY (EGD) WITH PROPOFOL ;  Surgeon: Marnee Sink, MD;  Location: [facility] ENDOSCOPY;  Service: Endoscopy;  Laterality: N/A;   GIVENS CAPSULE STUDY N/A 11/03/2018   Procedure: GIVENS CAPSULE STUDY;  Surgeon: Marnee Sink, MD;  Location: [facility] ENDOSCOPY;  Service: Endoscopy;  Laterality: N/A;   JOINT REPLACEMENT Left    knee   KNEE ARTHROPLASTY Right 04/09/2019   Procedure: COMPUTER ASSISTED TOTAL KNEE ARTHROPLASTY;  Surgeon: Arlyne Lame, MD;  Location: [facility] ORS;  Service: Orthopedics;  Laterality: Right;   KNEE CLOSED REDUCTION Left 04/15/2015   Procedure: CLOSED MANIPULATION KNEE;  Surgeon: Rande Bushy, MD;  Location: [facility] ORS;  Service: Orthopedics;  Laterality: Left;    KNEE SURGERY     MAZE N/A 04/29/2020   Procedure: MAZE;  Surgeon: Gardenia Jump, MD;  Location: [facility] OR;  Service: Open Heart Surgery;  Laterality: N/A;   MITRAL VALVE REPLACEMENT N/A 04/29/2020   Procedure: MITRAL VALVE (MV) REPLACEMENT USING ON-X VALVE SIZE 27/29MM;  Surgeon: Gardenia Jump, MD;  Location: [facility] OR;  Service: Open Heart Surgery;  Laterality: N/A;   MULTIPLE EXTRACTIONS WITH ALVEOLOPLASTY N/A 02/28/2020   Procedure: MULTIPLE EXTRACTION WITH ALVEOLOPLASTY;  Surgeon: Rene Carrier, DMD;  Location: [facility] OR;  Service: Dentistry;  Laterality: N/A;   RIGHT/LEFT HEART CATH AND CORONARY ANGIOGRAPHY Bilateral 09/06/2019   Procedure: RIGHT/LEFT HEART CATH AND CORONARY ANGIOGRAPHY;  Surgeon: Devorah Fonder, MD;  Location: [facility] INVASIVE CV LAB;  Service: Cardiovascular;  Laterality: Bilateral;   TEE WITHOUT CARDIOVERSION N/A 09/13/2017   Procedure: TRANSESOPHAGEAL ECHOCARDIOGRAM (TEE);  Surgeon: Sammy Crisp, MD;  Location: [facility] ORS;  Service: Cardiovascular;  Laterality: N/A;   TEE WITHOUT CARDIOVERSION N/A 04/01/2020   Procedure: TRANSESOPHAGEAL ECHOCARDIOGRAM (TEE);  Surgeon: Euell Herrlich, MD;  Location: [facility]  ENDOSCOPY;  Service: Cardiovascular;  Laterality: N/A;   TEE WITHOUT CARDIOVERSION N/A 04/29/2020   Procedure: TRANSESOPHAGEAL ECHOCARDIOGRAM (TEE);  Surgeon: Gardenia Jump, MD;  Location: [facility] OR;  Service: Open Heart Surgery;  Laterality: N/A;   TOTAL KNEE ARTHROPLASTY Left 12/25/2014   Procedure: TOTAL KNEE ARTHROPLASTY;  Surgeon: Rande Bushy, MD;  Location: [facility] ORS;  Service: Orthopedics;  Laterality: Left;   TUBAL LIGATION      Patient Active Problem List   Diagnosis Date Noted   Hypercalcemia 06/18/2022   Hyperparathyroidism due to renal insufficiency (HCC) 06/18/2022   Hyperuricemia 06/18/2022   Chronic kidney disease, stage 4 (severe) (HCC) 06/18/2022   AKI (acute kidney injury) (HCC) 11/12/2021   Hypokalemia 11/11/2021   Prolonged QT interval 11/11/2021    Hyperlipidemia 11/11/2021   Melena    Bleeding 09/07/2020   Blood loss anemia 09/06/2020   S/P mitral valve replacement with Onyx bileaflet mechanical valve 04/29/2020   S/P Maze operation for atrial fibrillation 04/29/2020   S/P MVR (mitral valve replacement) 04/29/2020   BMI 39.0-39.9,adult 09/30/2019   Total knee replacement status 04/09/2019   Acute nontraumatic kidney injury (HCC) 12/18/2018   Acute renal failure superimposed on stage 3b chronic kidney disease (HCC) 12/09/2018   Right knee pain 11/27/2018   GERD (gastroesophageal reflux disease) 10/25/2018   CKD (chronic kidney disease), stage III (HCC) 10/25/2018   CHF (congestive heart failure) (HCC) 10/25/2018   Normocytic anemia 10/01/2018   Iron deficiency anemia secondary to blood loss (chronic)    Polyp of descending colon    Chronic heart failure with preserved ejection fraction (HFpEF) (HCC)    Renal calculus, right 07/03/2018   Low back pain 06/27/2018   Pedal edema 06/27/2018   Abnormal renal function 02/16/2018   Acute recurrent pansinusitis 01/13/2018   Anxiety 01/13/2018   Osteoarthritis of knee 01/09/2018   Morbid obesity with body mass index (BMI) of 40.0 or higher (HCC) 11/13/2017   Major depressive disorder 09/22/2017   Mitral valve stenosis    Paroxysmal atrial fibrillation (HCC) 09/11/2017   Abdominal pain, generalized 09/08/2017   Hypertension 06/06/2017   Acute upper respiratory infection 04/28/2017   Pain of left lower extremity 02/11/2016   S/P total knee replacement using cement 12/25/2014   Fibrocystic breast disease 03/23/2013    Family History  Problem Relation Age of Onset   Breast cancer Mother    Cancer Mother 53       breast   Hypertension Mother    Osteoarthritis Father    Hypertension Father    Cancer Maternal Aunt        breast   Breast cancer Maternal Grandmother    Cancer Maternal Grandmother        breast    Social History   Tobacco Use   Smoking status: Former     Current packs/day: 0.00    Average packs/day: 0.5 packs/day for 20.0 years (10.0 ttl pk-yrs)    Types: Cigarettes    Start date: 01/1996    Quit date: 01/2016    Years since quitting: 7.3   Smokeless tobacco: Never  Substance Use Topics   Alcohol use: Not Currently    Comment: rarely    Allergies  Allergen Reactions   Morphine Hives and Rash   Oxycodone  Hcl Hives and Itching   Dilaudid  [Hydromorphone  Hcl] Itching    Current Meds  Medication Sig   acetaminophen -codeine  (TYLENOL  #3) 300-30 MG tablet Take 1 tablet by mouth every 6 (six) hours as needed for severe  pain (pain score 7-10).   albuterol  (VENTOLIN  HFA) 108 (90 Base) MCG/ACT inhaler Inhale 2 puffs into the lungs every 6 (six) hours as needed for wheezing or shortness of breath.   allopurinol (ZYLOPRIM) 100 MG tablet Take 100 mg by mouth daily.   ALPRAZolam  (XANAX ) 0.5 MG tablet TAKE 1/2 TABLET BY MOUTH AT BEDTIME AS NEEDED FOR ANXIETY   amLODipine  (NORVASC ) 5 MG tablet Take 1 tablet (5 mg total) by mouth daily.   aspirin  81 MG EC tablet Take 81 mg by mouth daily.   atorvastatin  (LIPITOR ) 80 MG tablet Take 1 tablet (80 mg total) by mouth daily.   calcium  carbonate (TUMS - DOSED IN MG ELEMENTAL CALCIUM ) 500 MG chewable tablet Chew 500 mg by mouth daily as needed for indigestion or heartburn.   dapagliflozin propanediol (FARXIGA) 10 MG TABS tablet Take 10 mg by mouth daily.   KLOR-CON  M20 20 MEQ tablet TAKE 2 TABLETS BY MOUTH TWICE A DAY   losartan (COZAAR) 25 MG tablet Take 1 tablet by mouth daily.   metoprolol  succinate (TOPROL -XL) 100 MG 24 hr tablet Take 1 tablet (100 mg total) by mouth daily.   omeprazole  (PRILOSEC) 40 MG capsule TAKE 1 CAPSULE (40 MG TOTAL) BY MOUTH DAILY.   Semaglutide ,0.25 or 0.5MG /DOS, (OZEMPIC , 0.25 OR 0.5 MG/DOSE,) 2 MG/3ML SOPN Take 0.25 mg first week and then increase to 0.5 mg on 2nd. 3rd and 4th week   spironolactone  (ALDACTONE ) 25 MG tablet Take 1 tablet (25 mg total) by mouth daily.    torsemide  (DEMADEX ) 20 MG tablet Take 1 tablet (20 mg total) by mouth 2 (two) times daily.   Vitamin D , Ergocalciferol , (DRISDOL ) 1.25 MG (50000 UNIT) CAPS capsule TAKE 1 CAPSULE BY MOUTH ONE TIME PER WEEK   warfarin (COUMADIN ) 2 MG tablet TAKE 1-2 TABLETS DAILY OR AS PRESCRIBED BY COUMADIN  CLINIC    Immunization History  Administered Date(s) Administered   Influenza,inj,Quad PF,6+ Mos 11/02/2017   PFIZER(Purple Top)SARS-COV-2 Vaccination 06/15/2019, 07/06/2019, 02/01/2020, 07/11/2020   PNEUMOCOCCAL CONJUGATE-20 11/14/2021        Objective:     BP 112/60 (BP Location: Right Arm, Patient Position: Sitting, Cuff Size: Normal)   Pulse 68   Temp 97.6 F (36.4 C) (Temporal)   Ht 5\' 4"  (1.626 m)   Wt 233 lb 6.4 oz (105.9 kg)   SpO2 99%   BMI 40.06 kg/m   SpO2: 99 %  GENERAL: Obese woman, no acute distress, ambulatory, conversational dyspnea. HEAD: Normocephalic, atraumatic.  EYES: Pupils equal, round, reactive to light.  No scleral icterus.  MOUTH: Dentures both, oral mucosa moist.  No thrush. NECK: Supple. No thyromegaly. Trachea midline. No JVD.  No adenopathy. PULMONARY: Good air entry bilaterally.  Scattered wheezes noted. CARDIOVASCULAR: S1 and S2. Regular rate and rhythm.  No rubs, murmurs or gallops heard. ABDOMEN: Obese, otherwise benign. MUSCULOSKELETAL: No joint deformity, no clubbing, no edema.  NEUROLOGIC: No overt focal deficit, no gait disturbance, speech is fluent. SKIN: Intact,warm,dry. PSYCH: Mood and behavior normal.  FeNO : 57 ppb, evidence of increased type II inflammation.      Assessment & Plan:     ICD-10-CM   1. Shortness of breath  R06.02 Pulmonary function test    Nitric oxide     2. Asthmatic bronchitis without complication, unspecified asthma severity, unspecified whether persistent  J45.909 Pulmonary function test    3. At risk for sleep apnea  Z91.89 Home sleep test    4. S/P mitral valve replacement  Z95.2  Orders Placed This  Encounter  Procedures   Nitric oxide    Pulmonary function test    Standing Status:   Future    Expiration Date:   05/09/2024    Where should this test be performed?:   Outpatient Pulmonary    What type of PFT is being ordered?:   Full PFT   Home sleep test    Standing Status:   Future    Expected Date:   05/17/2023    Expiration Date:   05/09/2024    Where should this test be performed::   [facility] - Pulmonary   Discussion:    Asthma Chronic asthma since childhood with persistent symptoms of shortness of breath, wheezing, and occasional cough despite smoking cessation in 2019 and mitral valve replacement in 2021. Wheezing present on examination, not severe.  - Prescribe Trelegy inhaler, one puff daily, with instructions to rinse mouth after use. - Provide Trelegy samples for a one-month trial period. - Schedule pulmonary function tests to assess lung function. - Follow-up in 6-8 weeks to assess response to treatment.  High suspect for sleep apnea Sleep apnea suspected due to episodes of apnea during sleep. - Order home sleep study to evaluate for sleep apnea.  Smoking cessation Quit smoking in 2019 following a myocardial infarction. Smoking history may contribute to current respiratory symptoms.  Mitral valve replacement Mitral valve replacement in 2021 with no current valve-related issues.  Myocardial infarction Experienced myocardial infarction prior to smoking cessation in 2019. No current cardiac symptoms reported.      Advised if symptoms do not improve or worsen, to please contact office for sooner follow up or seek emergency care.    I spent 45 minutes of dedicated to the care of this patient on the date of this encounter to include pre-visit review of records, face-to-face time with the patient discussing conditions above, post visit ordering of testing, clinical documentation with the electronic health record, making appropriate referrals as documented, and communicating  necessary findings to members of the patients care team.   C. Chloe Counter, MD Advanced Bronchoscopy PCCM [facility]    *This note was dictated using voice recognition software/Dragon.  Despite best efforts to proofread, errors can occur which can change the meaning. Any transcriptional errors that result from this process are unintentional and may not be fully corrected at the time of dictation.

## 2023-05-11 ENCOUNTER — Other Ambulatory Visit: Payer: Self-pay

## 2023-05-11 ENCOUNTER — Other Ambulatory Visit: Payer: Self-pay | Admitting: Physician Assistant

## 2023-05-11 ENCOUNTER — Other Ambulatory Visit: Payer: Self-pay | Admitting: Internal Medicine

## 2023-05-11 ENCOUNTER — Telehealth: Payer: Self-pay

## 2023-05-11 MED ORDER — AMOXICILLIN 500 MG PO TABS: 500.0000 mg | ORAL_TABLET | Freq: Two times a day (BID) | ORAL | 0 refills | Status: DC

## 2023-05-11 MED ORDER — FLUCONAZOLE 150 MG PO TABS: ORAL_TABLET | ORAL | 0 refills | Status: DC

## 2023-05-11 MED ORDER — VITAMIN D (ERGOCALCIFEROL) 1.25 MG (50000 UNIT) PO CAPS: 50000.0000 [IU] | ORAL_CAPSULE | ORAL | 1 refills | Status: DC

## 2023-05-11 NOTE — Telephone Encounter (Signed)
 Pt advised bas per lauren culture showed large amount of normal flora that typically doesn't need treatment, however given high amount can send amoxicillin  500 mg BID for 5 days

## 2023-05-18 ENCOUNTER — Other Ambulatory Visit: Payer: Self-pay | Admitting: Physician Assistant

## 2023-05-18 ENCOUNTER — Ambulatory Visit: Attending: Cardiovascular Disease

## 2023-05-18 DIAGNOSIS — Z5181 Encounter for therapeutic drug level monitoring: Secondary | ICD-10-CM | POA: Diagnosis not present

## 2023-05-18 DIAGNOSIS — F419 Anxiety disorder, unspecified: Secondary | ICD-10-CM

## 2023-05-18 DIAGNOSIS — I050 Rheumatic mitral stenosis: Secondary | ICD-10-CM | POA: Diagnosis not present

## 2023-05-18 DIAGNOSIS — I4891 Unspecified atrial fibrillation: Secondary | ICD-10-CM

## 2023-05-18 LAB — POCT INR: INR: 2.7 (ref 2.0–3.0)

## 2023-05-18 NOTE — Patient Instructions (Signed)
continue 1 tablet daily except 1/2 tablet on Mondays and Fridays. Stay consistent with greens.  - Recheck INR in 6 weeks [phone]

## 2023-05-18 NOTE — Telephone Encounter (Signed)
 Next 519/25

## 2023-05-25 ENCOUNTER — Other Ambulatory Visit: Payer: Self-pay | Admitting: Internal Medicine

## 2023-05-27 ENCOUNTER — Other Ambulatory Visit: Payer: Self-pay | Admitting: Cardiovascular Disease

## 2023-05-30 ENCOUNTER — Encounter: Payer: Self-pay | Admitting: Internal Medicine

## 2023-06-03 DIAGNOSIS — G5603 Carpal tunnel syndrome, bilateral upper limbs: Secondary | ICD-10-CM | POA: Diagnosis not present

## 2023-06-03 DIAGNOSIS — G5622 Lesion of ulnar nerve, left upper limb: Secondary | ICD-10-CM | POA: Diagnosis not present

## 2023-06-03 DIAGNOSIS — G5602 Carpal tunnel syndrome, left upper limb: Secondary | ICD-10-CM | POA: Diagnosis not present

## 2023-06-06 ENCOUNTER — Ambulatory Visit (INDEPENDENT_AMBULATORY_CARE_PROVIDER_SITE_OTHER): Payer: Medicare Other | Admitting: Physician Assistant

## 2023-06-06 ENCOUNTER — Encounter: Payer: Self-pay | Admitting: Physician Assistant

## 2023-06-06 VITALS — BP 130/70 | HR 72 | Temp 98.2°F | Resp 16 | Ht 64.0 in | Wt 234.0 lb

## 2023-06-06 DIAGNOSIS — E538 Deficiency of other specified B group vitamins: Secondary | ICD-10-CM | POA: Diagnosis not present

## 2023-06-06 DIAGNOSIS — N200 Calculus of kidney: Secondary | ICD-10-CM | POA: Diagnosis not present

## 2023-06-06 DIAGNOSIS — N390 Urinary tract infection, site not specified: Secondary | ICD-10-CM

## 2023-06-06 DIAGNOSIS — R5383 Other fatigue: Secondary | ICD-10-CM | POA: Diagnosis not present

## 2023-06-06 DIAGNOSIS — E782 Mixed hyperlipidemia: Secondary | ICD-10-CM | POA: Diagnosis not present

## 2023-06-06 DIAGNOSIS — E559 Vitamin D deficiency, unspecified: Secondary | ICD-10-CM | POA: Diagnosis not present

## 2023-06-06 DIAGNOSIS — Z1329 Encounter for screening for other suspected endocrine disorder: Secondary | ICD-10-CM

## 2023-06-06 DIAGNOSIS — E1165 Type 2 diabetes mellitus with hyperglycemia: Secondary | ICD-10-CM

## 2023-06-06 DIAGNOSIS — R300 Dysuria: Secondary | ICD-10-CM | POA: Diagnosis not present

## 2023-06-06 LAB — POCT URINALYSIS DIPSTICK

## 2023-06-06 MED ORDER — NITROFURANTOIN MONOHYD MACRO 100 MG PO CAPS
ORAL_CAPSULE | ORAL | 0 refills | Status: DC
Start: 2023-06-06 — End: 2023-09-23

## 2023-06-06 MED ORDER — FLUCONAZOLE 150 MG PO TABS: ORAL_TABLET | ORAL | 0 refills | Status: DC

## 2023-06-06 MED ORDER — OZEMPIC (0.25 OR 0.5 MG/DOSE) 2 MG/3ML ~~LOC~~ SOPN: PEN_INJECTOR | SUBCUTANEOUS | 1 refills | Status: DC

## 2023-06-06 NOTE — Progress Notes (Signed)
 [facility] [address] [place], [place] [address]  Internal MEDICINE  Office Visit Note  Patient Name: Leslie [name]  [id number]  [id number]  Date of Service: 06/06/2023  Chief Complaint  Patient presents with   Follow-up   Gastroesophageal Reflux   Hypertension   Hyperlipidemia   Recurrent UTI   Labs Only    Requesting labs to check B12 and A1c    HPI Pt is here for routine follow up -UTI symptoms on and off for weeks, did better on ABX but then comes back once they are stopped. Urine cultures often come back normal flora, but she continues to feel symptoms. Has not seen urology recently and will send new referral due to persistent concerns for UTI. Does also have hx of kidney stone. -sees Dr. Gollan next month, was sent to pulmonology as well and was given an inhaler -seeing ortho for left hand/wrist tingling and pain with carpal tunnel -due for labs and will order today -doing well with ozempic  and would like refill. Down 4 lbs since last month, 15lb since Feb!  Current Medication: Outpatient Encounter Medications as of 06/06/2023  Medication Sig   acetaminophen -codeine  (TYLENOL  #3) 300-30 MG tablet Take 1 tablet by mouth every 6 (six) hours as needed for severe pain (pain score 7-10).   albuterol  (VENTOLIN  HFA) 108 (90 Base) MCG/ACT inhaler Inhale 2 puffs into the lungs every 6 (six) hours as needed for wheezing or shortness of breath.   allopurinol (ZYLOPRIM) 100 MG tablet Take 100 mg by mouth daily.   ALPRAZolam  (XANAX ) 0.5 MG tablet TAKE 1/2 TABLET BY MOUTH AT BEDTIME AS NEEDED FOR ANXIETY   amLODipine  (NORVASC ) 5 MG tablet Take 1 tablet (5 mg total) by mouth daily.   aspirin  81 MG EC tablet Take 81 mg by mouth daily.   atorvastatin  (LIPITOR ) 80 MG tablet Take 1 tablet (80 mg total) by mouth daily.   bisacodyl  (DULCOLAX) 5 MG EC tablet One tab at night Monday wed and friday   calcium  carbonate (TUMS - DOSED IN MG ELEMENTAL CALCIUM ) 500 MG chewable tablet  Chew 500 mg by mouth daily as needed for indigestion or heartburn.   dapagliflozin propanediol (FARXIGA) 10 MG TABS tablet Take 10 mg by mouth daily.   estradiol  (ESTRACE ) 0.1 MG/GM vaginal cream 1/2 applicator once a week   famciclovir  (FAMVIR ) 500 MG tablet TAKE ONE TAB BY MOUTH TWICE A DAY FOR INFECTION   Fluticasone -Umeclidin-Vilant (TRELEGY ELLIPTA ) 100-62.5-25 MCG/ACT AEPB Inhale 1 puff into the lungs daily in the afternoon.   hyoscyamine  (LEVBID) 0.375 MG 12 hr tablet Take 1 tablet (0.375 mg total) by mouth 2 (two) times daily.   KLOR-CON  M20 20 MEQ tablet TAKE 2 TABLETS BY MOUTH TWICE A DAY   losartan (COZAAR) 25 MG tablet Take 1 tablet by mouth daily.   metoprolol  succinate (TOPROL -XL) 100 MG 24 hr tablet Take 1 tablet (100 mg total) by mouth daily.   nitrofurantoin , macrocrystal-monohydrate, (MACROBID ) 100 MG capsule Take 1 cap twice per day for 10 days.   omeprazole  (PRILOSEC) 40 MG capsule TAKE 1 CAPSULE (40 MG TOTAL) BY MOUTH DAILY.   phenazopyridine  (PYRIDIUM ) 200 MG tablet Take 1 tablet (200 mg total) by mouth 3 (three) times daily as needed for pain.   spironolactone  (ALDACTONE ) 25 MG tablet Take 1 tablet (25 mg total) by mouth daily.   torsemide  (DEMADEX ) 20 MG tablet Take 1 tablet (20 mg total) by mouth 2 (two) times daily.   traZODone  (DESYREL ) 50 MG  tablet TAKE 1 TABLET BY MOUTH EVERYDAY AT BEDTIME   Vitamin D , Ergocalciferol , (DRISDOL ) 1.25 MG (50000 UNIT) CAPS capsule Take 1 capsule (50,000 Units total) by mouth every 7 (seven) days.   warfarin (COUMADIN ) 2 MG tablet TAKE 1-2 TABLETS DAILY OR AS PRESCRIBED BY COUMADIN  CLINIC   [DISCONTINUED] amoxicillin  (AMOXIL ) 500 MG tablet Take 1 tablet (500 mg total) by mouth 2 (two) times daily.   [DISCONTINUED] chlorpheniramine-HYDROcodone  (TUSSIONEX) 10-8 MG/5ML Take 5 mLs by mouth every 12 (twelve) hours as needed.   [DISCONTINUED] fluconazole  (DIFLUCAN ) 150 MG tablet Take 1 tab po once may repeat in 3 days if symptoms persist    [DISCONTINUED] Semaglutide ,0.25 or 0.5MG /DOS, (OZEMPIC , 0.25 OR 0.5 MG/DOSE,) 2 MG/3ML SOPN Take 0.25 mg first week and then increase to 0.5 mg on 2nd. 3rd and 4th week   fluconazole  (DIFLUCAN ) 150 MG tablet Take 1 tab po once may repeat in 3 days if symptoms persist   Semaglutide ,0.25 or 0.5MG /DOS, (OZEMPIC , 0.25 OR 0.5 MG/DOSE,) 2 MG/3ML SOPN Take 0.5 mg weekly.   No facility-administered encounter medications on file as of 06/06/2023.    Surgical History: Past Surgical History:  Procedure Laterality Date   ABDOMINAL HYSTERECTOMY     total   ABDOMINAL HYSTERECTOMY     BREAST BIOPSY Bilateral 2012   BREAST BIOPSY Right 08-07-12   fibroadenomatous changes and columnar cells   BREAST BIOPSY  02/03/2015   stereo byrnett   BUBBLE STUDY  04/01/2020   Procedure: BUBBLE STUDY;  Surgeon: Euell Herrlich, MD;  Location: [facility] ENDOSCOPY;  Service: Cardiovascular;;   CARDIAC CATHETERIZATION     CHOLECYSTECTOMY N/A 02/27/2016   Procedure: LAPAROSCOPIC CHOLECYSTECTOMY;  Surgeon: Jerlean Mood, MD;  Location: [facility] ORS;  Service: General;  Laterality: N/A;   CLIPPING OF ATRIAL APPENDAGE  04/29/2020   Procedure: CLIPPING OF ATRIAL APPENDAGE USING ATRICURE  PRO2 CLIP SIZE ;  Surgeon: Gardenia Jump, MD;  Location[facility] OR;  Service: Open Heart Surgery;;   COLONOSCOPY WITH PROPOFOL  N/A 09/26/2018   Procedure: COLONOSCOPY WITH PROPOFOL ;  Surgeon: Marnee Sink, MD;  Location: [facility] ENDOSCOPY;  Service: Endoscopy;  Laterality: N/A;   COLONOSCOPY WITH PROPOFOL  N/A 07/16/2021   Procedure: COLONOSCOPY WITH PROPOFOL ;  Surgeon: Marnee Sink, MD;  Location: [facility] ENDOSCOPY;  Service: Endoscopy;  Laterality: N/A;   CYSTOSCOPY W/ RETROGRADES Right 08/18/2018   Procedure: CYSTOSCOPY WITH RETROGRADE PYELOGRAM;  Surgeon: Lawerence Pressman, MD;  Location: [facility] ORS;  Service: Urology;  Laterality: Right;   CYSTOSCOPY/URETEROSCOPY/HOLMIUM LASER/STENT PLACEMENT Right 08/18/2018   Procedure: CYSTOSCOPY/URETEROSCOPY/STENT  PLACEMENT;  Surgeon: Lawerence Pressman, MD;  Location: [facility] ORS;  Service: Urology;  Laterality: Right;   DIAGNOSTIC LAPAROSCOPY     ESOPHAGOGASTRODUODENOSCOPY N/A 07/16/2021   Procedure: ESOPHAGOGASTRODUODENOSCOPY (EGD);  Surgeon: Marnee Sink, MD;  Location: [facility] ENDOSCOPY;  Service: Endoscopy;  Laterality: N/A;   ESOPHAGOGASTRODUODENOSCOPY (EGD) WITH PROPOFOL  N/A 09/26/2018   Procedure: ESOPHAGOGASTRODUODENOSCOPY (EGD) WITH PROPOFOL ;  Surgeon: Marnee Sink, MD;  Location: [facility] ENDOSCOPY;  Service: Endoscopy;  Laterality: N/A;   GIVENS CAPSULE STUDY N/A 11/03/2018   Procedure: GIVENS CAPSULE STUDY;  Surgeon: Marnee Sink, MD;  Location: [facility] ENDOSCOPY;  Service: Endoscopy;  Laterality: N/A;   JOINT REPLACEMENT Left    knee   KNEE ARTHROPLASTY Right 04/09/2019   Procedure: COMPUTER ASSISTED TOTAL KNEE ARTHROPLASTY;  Surgeon: Arlyne Lame, MD;  Location: [facility] ORS;  Service: Orthopedics;  Laterality: Right;   KNEE CLOSED REDUCTION Left 04/15/2015   Procedure: CLOSED MANIPULATION KNEE;  Surgeon: Rande Bushy, MD;  Location: [facility] ORS;  Service: Orthopedics;  Laterality: Left;   KNEE SURGERY     MAZE N/A 04/29/2020   Procedure: MAZE;  Surgeon: Gardenia Jump, MD;  Location: [facility] OR;  Service: Open Heart Surgery;  Laterality: N/A;   MITRAL VALVE REPLACEMENT N/A 04/29/2020   Procedure: MITRAL VALVE (MV) REPLACEMENT USING ON-X VALVE SIZE 27/29MM;  Surgeon: Gardenia Jump, MD;  Location: [facility] OR;  Service: Open Heart Surgery;  Laterality: N/A;   MULTIPLE EXTRACTIONS WITH ALVEOLOPLASTY N/A 02/28/2020   Procedure: MULTIPLE EXTRACTION WITH ALVEOLOPLASTY;  Surgeon: Rene Carrier, DMD;  Location: [facility] OR;  Service: Dentistry;  Laterality: N/A;   RIGHT/LEFT HEART CATH AND CORONARY ANGIOGRAPHY Bilateral 09/06/2019   Procedure: RIGHT/LEFT HEART CATH AND CORONARY ANGIOGRAPHY;  Surgeon: Devorah Fonder, MD;  Location: [facility] INVASIVE CV LAB;  Service: Cardiovascular;  Laterality: Bilateral;   TEE WITHOUT  CARDIOVERSION N/A 09/13/2017   Procedure: TRANSESOPHAGEAL ECHOCARDIOGRAM (TEE);  Surgeon: Sammy Crisp, MD;  Location: [facility] ORS;  Service: Cardiovascular;  Laterality: N/A;   TEE WITHOUT CARDIOVERSION N/A 04/01/2020   Procedure: TRANSESOPHAGEAL ECHOCARDIOGRAM (TEE);  Surgeon: Euell Herrlich, MD;  Location: [facility] ENDOSCOPY;  Service: Cardiovascular;  Laterality: N/A;   TEE WITHOUT CARDIOVERSION N/A 04/29/2020   Procedure: TRANSESOPHAGEAL ECHOCARDIOGRAM (TEE);  Surgeon: Gardenia Jump, MD;  Location: [facility] OR;  Service: Open Heart Surgery;  Laterality: N/A;   TOTAL KNEE ARTHROPLASTY Left 12/25/2014   Procedure: TOTAL KNEE ARTHROPLASTY;  Surgeon: Rande Bushy, MD;  Location: [facility] ORS;  Service: Orthopedics;  Laterality: Left;   TUBAL LIGATION      Medical History: Past Medical History:  Diagnosis Date   (HFpEF) heart failure with preserved ejection fraction (HCC)    a. 08/2017 Echo: EF 55-60%.  Grade 2 diastolic dysfunction; b. 05/2020 Echo: EF 50-55%, no rwma, Nl RV fxn, nl fxn'ing mech MV.   Allergy    Anemia    Anxiety    Asthma    BRCA negative 03/22/2013   Bronchitis 02/19/2016   ON LEVAQUIN  PO   Chest tightness    a. 08/2019 Cath: nl cors.   Cigarette smoker 09/11/2017   8-10 day   Dysrhythmia    GERD (gastroesophageal reflux disease)    History of kidney stones    Hyperlipidemia    Hypertension    Interstitial lung disease (HCC)    a. CT 2013 b. 02/2018 CXR noted recurrent intersistial changes ILD vs chronic bronchitis   Moderate mitral stenosis    a.  08/2017 TEE: EF 60 to 65%.  Moderate mitral stenosis.  Mean gradient 14 mmHg.  Valve area 2.59 cm by planimetry, 2.72 cm by pressure half-time.   PAF (paroxysmal atrial fibrillation) (HCC)    a. 08/2017 s/p TEE/DCCV; b. CHA2DS2VASc = 2-->warfarin.   Pneumonia    S/P Maze operation for atrial fibrillation 04/29/2020   Complete bilateral atrial lesion set using bipolar radiofrequency and cryothermy ablation with clipping of LA  appendage   S/P mitral valve replacement with Onyx bileaflet mechanical valve 04/29/2020   a. 04/2020 s/p 27/29 mm Onyx mech mitral valve-->chronic coumadin ; b. 05/2020 Echo: Nl fxn'ing mech MV.    Family History: Family History  Problem Relation Age of Onset   Breast cancer Mother    Cancer Mother 10       breast   Hypertension Mother    Osteoarthritis Father    Hypertension Father    Cancer Maternal Aunt        breast   Breast cancer Maternal  Grandmother    Cancer Maternal Grandmother        breast    Social History   Socioeconomic History   Marital status: Married    Spouse name: Not on file   Number of children: Not on file   Years of education: Not on file   Highest education level: Not on file  Occupational History   Not on file  Tobacco Use   Smoking status: Former    Current packs/day: 0.00    Average packs/day: 0.5 packs/day for 20.0 years (10.0 ttl pk-yrs)    Types: Cigarettes    Start date: 01/1996    Quit date: 01/2016    Years since quitting: 7.3   Smokeless tobacco: Never  Vaping Use   Vaping status: Never Used  Substance and Sexual Activity   Alcohol use: Not Currently    Comment: rarely   Drug use: No   Sexual activity: Not on file  Other Topics Concern   Not on file  Social History Narrative   Not on file   Social Drivers of Health   Financial Resource Strain: Medium Risk (03/11/2023)   Received from [facility]   Overall Financial Resource Strain (CARDIA)    Difficulty of Paying Living Expenses: Somewhat hard  Food Insecurity: Food Insecurity Present (03/11/2023)   Received from [facility]   Hunger Vital Sign    Worried About Running Out of Food in the Last Year: Sometimes true    Ran Out of Food in the Last Year: Sometimes true  Transportation Needs: No Transportation Needs (03/11/2023)   Received from [facility]   PRAPARE - Transportation    In the past 12 months, has lack of  transportation kept you from medical appointments or from getting medications?: No    Lack of Transportation (Non-Medical): No  Physical Activity: Not on file  Stress: Not on file  Social Connections: Not on file  Intimate Partner Violence: Not At Risk (11/16/2021)   Humiliation, Afraid, Rape, and Kick questionnaire    Fear of Current or Ex-Partner: No    Emotionally Abused: No    Physically Abused: No    Sexually Abused: No      Review of Systems  Constitutional:  Negative for fever.  HENT:  Negative for postnasal drip.   Respiratory: Negative.  Negative for cough, chest tightness, shortness of breath and wheezing.   Cardiovascular: Negative.  Negative for chest pain and palpitations.  Genitourinary:  Positive for dysuria, flank pain, frequency and urgency.  Musculoskeletal:  Positive for back pain.  Skin:  Negative for rash.  Neurological:  Negative for headaches.  Psychiatric/Behavioral:  Negative for dysphoric mood, self-injury and suicidal ideas.     Vital Signs: BP 130/70   Pulse 72   Temp 98.2 F (36.8 C)   Resp 16   Ht 5\' 4"  (1.626 m)   Wt 234 lb (106.1 kg)   SpO2 98%   BMI 40.17 kg/m    Physical Exam Constitutional:      Appearance: Normal appearance.  HENT:     Head: Normocephalic and atraumatic.  Eyes:     Extraocular Movements: Extraocular movements intact.  Cardiovascular:     Rate and Rhythm: Normal rate and regular rhythm.     Pulses: Normal pulses.     Heart sounds: Normal heart sounds.  Pulmonary:     Effort: Pulmonary effort is normal.     Breath sounds: Normal breath sounds.  Abdominal:  Tenderness: There is no abdominal tenderness.  Skin:    General: Skin is warm and dry.  Neurological:     General: No focal deficit present.     Mental Status: She is alert.  Psychiatric:        Mood and Affect: Mood normal.        Behavior: Behavior normal.        Assessment/Plan: 1. Type 2 diabetes mellitus with hyperglycemia, without  long-term current use of insulin  (HCC) (Primary) Will continue ozempic  as before and will check A1c with labs - Hgb A1C w/o eAG  2. Urinary tract infection without hematuria, site unspecified Will treat with macrobid , and adjsut based on C/S. However discussed need for urology due to recurrent symptoms and multiple rounds of ABX - CULTURE, URINE COMPREHENSIVE - nitrofurantoin , macrocrystal-monohydrate, (MACROBID ) 100 MG capsule; Take 1 cap twice per day for 10 days.  Dispense: 20 capsule; Refill: 0  3. Recurrent UTI Will refer to urology - Ambulatory referral to Urology  4. Dysuria - POCT Urinalysis Dipstick  5. Renal calculus, right Will refer to urology - Ambulatory referral to Urology  6. B12 deficiency - B12 and Folate Panel  7. Vitamin D  deficiency - VITAMIN D  25 Hydroxy (Vit-D Deficiency, Fractures)  8. Thyroid  disorder screen - TSH + free T4  9. Mixed hyperlipidemia Continue lipitor  and will update labs - Lipid Panel With LDL/HDL Ratio  10. Other fatigue - CBC w/Diff/Platelet - Comprehensive metabolic panel with GFR - TSH + free T4 - B12 and Folate Panel - VITAMIN D  25 Hydroxy (Vit-D Deficiency, Fractures) - Fe+TIBC+Fer - Lipid Panel With LDL/HDL Ratio - Hgb A1C w/o eAG   General Counseling: Vester verbalizes understanding of the findings of todays visit and agrees with plan of treatment. I have discussed any further diagnostic evaluation that may be needed or ordered today. We also reviewed her medications today. she has been encouraged to call the office with any questions or concerns that should arise related to todays visit.    Orders Placed This Encounter  Procedures   CULTURE, URINE COMPREHENSIVE   CBC w/Diff/Platelet   Comprehensive metabolic panel with GFR   TSH + free T4   B12 and Folate Panel   VITAMIN D  25 Hydroxy (Vit-D Deficiency, Fractures)   Fe+TIBC+Fer   Lipid Panel With LDL/HDL Ratio   Hgb A1C w/o eAG   Ambulatory referral to Urology    POCT Urinalysis Dipstick    Meds ordered this encounter  Medications   Semaglutide ,0.25 or 0.5MG /DOS, (OZEMPIC , 0.25 OR 0.5 MG/DOSE,) 2 MG/3ML SOPN    Sig: Take 0.5 mg weekly.    Dispense:  3 mL    Refill:  1   nitrofurantoin , macrocrystal-monohydrate, (MACROBID ) 100 MG capsule    Sig: Take 1 cap twice per day for 10 days.    Dispense:  20 capsule    Refill:  0   fluconazole  (DIFLUCAN ) 150 MG tablet    Sig: Take 1 tab po once may repeat in 3 days if symptoms persist    Dispense:  3 tablet    Refill:  0    This patient was seen by Taylor Favia, PA-C in collaboration with Dr. Verneta Gone as a part of collaborative care agreement.   Total time spent:30 Minutes Time spent includes review of chart, medications, test results, and follow up plan with the patient.      Dr Fozia M Khan Internal medicine

## 2023-06-07 ENCOUNTER — Encounter: Payer: Self-pay | Admitting: Internal Medicine

## 2023-06-07 ENCOUNTER — Other Ambulatory Visit
Admission: RE | Admit: 2023-06-07 | Discharge: 2023-06-07 | Disposition: A | Discharge status: Home / Self Care | Source: Ambulatory Visit | Attending: Physician Assistant | Admitting: Physician Assistant

## 2023-06-07 DIAGNOSIS — R5383 Other fatigue: Secondary | ICD-10-CM | POA: Diagnosis not present

## 2023-06-07 DIAGNOSIS — E559 Vitamin D deficiency, unspecified: Secondary | ICD-10-CM | POA: Diagnosis not present

## 2023-06-07 DIAGNOSIS — E538 Deficiency of other specified B group vitamins: Secondary | ICD-10-CM | POA: Diagnosis not present

## 2023-06-07 DIAGNOSIS — Z1329 Encounter for screening for other suspected endocrine disorder: Secondary | ICD-10-CM | POA: Insufficient documentation

## 2023-06-07 DIAGNOSIS — E1165 Type 2 diabetes mellitus with hyperglycemia: Secondary | ICD-10-CM | POA: Insufficient documentation

## 2023-06-07 DIAGNOSIS — E782 Mixed hyperlipidemia: Secondary | ICD-10-CM | POA: Insufficient documentation

## 2023-06-07 LAB — LIPID PANEL
Cholesterol: 178 mg/dL (ref 0–200)
HDL: 37 mg/dL — ABNORMAL LOW (ref >40)
LDL Cholesterol: 88 mg/dL (ref 0–99)
Total CHOL/HDL Ratio: 4.8 ratio
Triglycerides: 264 mg/dL — ABNORMAL HIGH (ref <150)
VLDL: 53 mg/dL — ABNORMAL HIGH (ref 0–40)

## 2023-06-07 LAB — FOLATE: Folate: 10.8 ng/mL (ref >5.9)

## 2023-06-07 LAB — COMPREHENSIVE METABOLIC PANEL WITH GFR
ALT: 32 U/L (ref 0–44)
AST: 29 U/L (ref 15–41)
Albumin: 4.6 g/dL (ref 3.5–5.0)
Alkaline Phosphatase: 113 U/L (ref 38–126)
Anion gap: 10 (ref 5–15)
BUN: 34 mg/dL — ABNORMAL HIGH (ref 6–20)
CO2: 31 mmol/L (ref 22–32)
Calcium: 9.8 mg/dL (ref 8.9–10.3)
Chloride: 96 mmol/L — ABNORMAL LOW (ref 98–111)
Creatinine, Ser: 1.88 mg/dL — ABNORMAL HIGH (ref 0.44–1.00)
GFR, Estimated: 31 mL/min — ABNORMAL LOW (ref >60)
Glucose, Bld: 102 mg/dL — ABNORMAL HIGH (ref 70–99)
Potassium: 3.6 mmol/L (ref 3.5–5.1)
Sodium: 137 mmol/L (ref 135–145)
Total Bilirubin: 0.6 mg/dL (ref 0.0–1.2)
Total Protein: 8.4 g/dL — ABNORMAL HIGH (ref 6.5–8.1)

## 2023-06-07 LAB — CBC WITH DIFFERENTIAL/PLATELET
Abs Immature Granulocytes: 0.03 10*3/uL (ref 0.00–0.07)
Basophils Absolute: 0 10*3/uL (ref 0.0–0.1)
Basophils Relative: 1 %
Eosinophils Absolute: 0.2 10*3/uL (ref 0.0–0.5)
Eosinophils Relative: 2 %
HCT: 38.6 % (ref 36.0–46.0)
Hemoglobin: 12.4 g/dL (ref 12.0–15.0)
Immature Granulocytes: 0 %
Lymphocytes Relative: 31 %
Lymphs Abs: 2.1 10*3/uL (ref 0.7–4.0)
MCH: 28.5 pg (ref 26.0–34.0)
MCHC: 32.1 g/dL (ref 30.0–36.0)
MCV: 88.7 fL (ref 80.0–100.0)
Monocytes Absolute: 0.6 10*3/uL (ref 0.1–1.0)
Monocytes Relative: 9 %
Neutro Abs: 3.8 10*3/uL (ref 1.7–7.7)
Neutrophils Relative %: 57 %
Platelets: 394 10*3/uL (ref 150–400)
RBC: 4.35 MIL/uL (ref 3.87–5.11)
RDW: 13.9 % (ref 11.5–15.5)
WBC: 6.8 10*3/uL (ref 4.0–10.5)
nRBC: 0 % (ref 0.0–0.2)

## 2023-06-07 LAB — IRON AND TIBC
Iron: 74 ug/dL (ref 28–170)
Saturation Ratios: 17 % (ref 10.4–31.8)
TIBC: 435 ug/dL (ref 250–450)
UIBC: 361 ug/dL

## 2023-06-07 LAB — VITAMIN B12: Vitamin B-12: 466 pg/mL (ref 180–914)

## 2023-06-07 LAB — HEMOGLOBIN A1C
Hgb A1c MFr Bld: 5.9 % — ABNORMAL HIGH (ref 4.8–5.6)
Mean Plasma Glucose: 122.63 mg/dL

## 2023-06-07 LAB — T4, FREE: Free T4: 0.85 ng/dL (ref 0.61–1.12)

## 2023-06-07 LAB — TSH: TSH: 1.214 u[IU]/mL (ref 0.350–4.500)

## 2023-06-07 LAB — FERRITIN: Ferritin: 213 ng/mL (ref 11–307)

## 2023-06-07 LAB — VITAMIN D 25 HYDROXY (VIT D DEFICIENCY, FRACTURES): Vit D, 25-Hydroxy: 72.37 ng/mL (ref 30–100)

## 2023-06-08 LAB — CULTURE, URINE COMPREHENSIVE

## 2023-06-15 ENCOUNTER — Telehealth: Payer: Self-pay

## 2023-06-15 ENCOUNTER — Other Ambulatory Visit: Payer: Self-pay

## 2023-06-15 ENCOUNTER — Ambulatory Visit: Payer: Self-pay | Admitting: Physician Assistant

## 2023-06-15 MED ORDER — EZETIMIBE 10 MG PO TABS: 10.0000 mg | ORAL_TABLET | Freq: Every day | ORAL | 3 refills | Status: AC

## 2023-06-15 NOTE — Telephone Encounter (Signed)
 Spoke with patient regarding lab results and sent Zetia per Lauren.

## 2023-06-16 ENCOUNTER — Ambulatory Visit: Payer: Self-pay | Admitting: Physician Assistant

## 2023-06-17 NOTE — Telephone Encounter (Signed)
-----   Message from Jacques Mattock sent at 06/16/2023  4:19 PM EDT ----- Please let her know her urine did not show UTI

## 2023-06-17 NOTE — Telephone Encounter (Signed)
 Spoke with patient regarding urine culture.

## 2023-06-23 ENCOUNTER — Ambulatory Visit: Admitting: Pulmonary Disease

## 2023-06-23 ENCOUNTER — Encounter: Payer: Self-pay | Admitting: Pulmonary Disease

## 2023-06-23 VITALS — BP 118/72 | HR 75 | Temp 96.9°F | Ht 64.0 in | Wt 234.2 lb

## 2023-06-23 DIAGNOSIS — J45909 Unspecified asthma, uncomplicated: Secondary | ICD-10-CM

## 2023-06-23 DIAGNOSIS — R0602 Shortness of breath: Secondary | ICD-10-CM

## 2023-06-23 DIAGNOSIS — J4540 Moderate persistent asthma, uncomplicated: Secondary | ICD-10-CM

## 2023-06-23 LAB — PULMONARY FUNCTION TEST
DL/VA % pred: 104 %
DL/VA: 4.43 ml/min/mmHg/L
DLCO cor % pred: 78 %
DLCO cor: 15.95 ml/min/mmHg
DLCO unc % pred: 70 %
DLCO unc: 14.39 ml/min/mmHg
FEF 25-75 Post: 2.66 L/s
FEF 25-75 Pre: 2.03 L/s
FEF2575-%Change-Post: 30 %
FEF2575-%Pred-Post: 105 %
FEF2575-%Pred-Pre: 80 %
FEV1-%Change-Post: 4 %
FEV1-%Pred-Post: 67 %
FEV1-%Pred-Pre: 64 %
FEV1-Post: 1.79 L
FEV1-Pre: 1.72 L
FEV1FVC-%Change-Post: 3 %
FEV1FVC-%Pred-Pre: 107 %
FEV6-%Change-Post: 0 %
FEV6-%Pred-Post: 62 %
FEV6-%Pred-Pre: 61 %
FEV6-Post: 2.05 L
FEV6-Pre: 2.03 L
FEV6FVC-%Pred-Post: 103 %
FEV6FVC-%Pred-Pre: 103 %
FVC-%Change-Post: 0 %
FVC-%Pred-Post: 60 %
FVC-%Pred-Pre: 59 %
FVC-Post: 2.05 L
FVC-Pre: 2.03 L
Post FEV1/FVC ratio: 87 %
Post FEV6/FVC ratio: 100 %
Pre FEV1/FVC ratio: 84 %
Pre FEV6/FVC Ratio: 100 %
RV % pred: 109 %
RV: 2.09 L
TLC % pred: 84 %
TLC: 4.25 L

## 2023-06-23 LAB — NITRIC OXIDE: Nitric Oxide: 7

## 2023-06-23 MED ORDER — TRELEGY ELLIPTA 100-62.5-25 MCG/ACT IN AEPB: 1.0000 | INHALATION_SPRAY | Freq: Every day | RESPIRATORY_TRACT | 0 refills | Status: AC

## 2023-06-23 MED ORDER — TRELEGY ELLIPTA 100-62.5-25 MCG/ACT IN AEPB: 1.0000 | INHALATION_SPRAY | Freq: Every day | RESPIRATORY_TRACT | 6 refills | Status: DC

## 2023-06-23 NOTE — Progress Notes (Signed)
 Full PFT completed today ? ?

## 2023-06-23 NOTE — Progress Notes (Unsigned)
 Subjective:    Patient ID: Leslie [name], female    DOB: Dec 04, 1965, 58 y.o.   MRN: [id number]  Patient Care Team: Villa Greaser as PCP - General (Physician Assistant) Devorah Fonder, MD as PCP - Cardiology (Cardiology) Jerlean Mood, MD (General Surgery)  No chief complaint on file.   BACKGROUND/INTERVAL:  HPI    Review of Systems A 10 point review of systems was performed and it is as noted above otherwise negative.   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 04/29/2020   S/P Maze operation for atrial fibrillation 04/29/2020   S/P MVR (mitral valve replacement) 04/29/2020   BMI 39.0-39.9,adult 09/30/2019   Total knee replacement status 04/09/2019   Acute nontraumatic kidney injury (HCC) 12/18/2018   Acute renal failure superimposed on stage 3b chronic kidney disease (HCC) 12/09/2018   Right knee pain 11/27/2018   GERD (gastroesophageal reflux disease) 10/25/2018   CKD (chronic kidney disease), stage III (HCC) 10/25/2018   CHF (congestive heart failure) (HCC) 10/25/2018   Normocytic anemia 10/01/2018   Iron deficiency anemia secondary to blood loss (chronic)    Polyp of descending colon    Chronic heart failure with preserved ejection fraction (HFpEF) (HCC)    Renal calculus, right 07/03/2018   Low back pain 06/27/2018   Pedal edema 06/27/2018   Abnormal renal function 02/16/2018   Acute recurrent pansinusitis 01/13/2018   Anxiety 01/13/2018   Osteoarthritis of knee 01/09/2018   Morbid  obesity with body mass index (BMI) of 40.0 or higher (HCC) 11/13/2017   Major depressive disorder 09/22/2017   Mitral valve stenosis    Paroxysmal atrial fibrillation (HCC) 09/11/2017   Abdominal pain, generalized 09/08/2017   Hypertension 06/06/2017   Acute upper respiratory infection 04/28/2017   Pain of left lower extremity 02/11/2016   S/P total knee replacement using cement 12/25/2014   Fibrocystic breast disease 03/23/2013    Social History   Tobacco Use   Smoking status: Former    Current packs/day: 0.00    Average packs/day: 0.5 packs/day for 20.0 years (10.0 ttl pk-yrs)    Types: Cigarettes    Start date: 01/1996    Quit date: 01/2016    Years since quitting: 7.4   Smokeless tobacco: Never  Substance Use Topics   Alcohol use: Not Currently    Comment: rarely    Allergies  Allergen Reactions   Morphine Hives and Rash   Oxycodone  Hcl Hives and Itching   Dilaudid  [Hydromorphone  Hcl] Itching    No outpatient medications have been marked as taking for the 06/23/23 encounter (Appointment) with Marc Senior, MD.    Immunization History  Administered Date(s) Administered   Influenza,inj,Quad PF,6+ Mos 11/02/2017   PFIZER(Purple Top)SARS-COV-2 Vaccination 06/15/2019, 07/06/2019, 02/01/2020, 07/11/2020   PNEUMOCOCCAL CONJUGATE-20 11/14/2021        Objective:     There were no vitals taken for this visit.     GENERAL: HEAD: Normocephalic, atraumatic.  EYES: Pupils equal, round, reactive to light.  No scleral icterus.  MOUTH:  NECK: Supple. No  thyromegaly. Trachea midline. No JVD.  No adenopathy. PULMONARY: Good air entry bilaterally.  No adventitious sounds. CARDIOVASCULAR: S1 and S2. Regular rate and rhythm.  ABDOMEN: MUSCULOSKELETAL: No joint deformity, no clubbing, no edema.  NEUROLOGIC:  SKIN: Intact,warm,dry. PSYCH:        Assessment & Plan:   No diagnosis found.  No orders of the defined types were placed in this encounter.   No  orders of the defined types were placed in this encounter.      Advised if symptoms do not improve or worsen, to please contact office for sooner follow up or seek emergency care.    I spent xxx minutes of dedicated to the care of this patient on the date of this encounter to include pre-visit review of records, face-to-face time with the patient discussing conditions above, post visit ordering of testing, clinical documentation with the electronic health record, making appropriate referrals as documented, and communicating necessary findings to members of the patients care team.     C. Chloe Counter, MD Advanced Bronchoscopy PCCM [facility]    *This note was generated using voice recognition software/Dragon and/or AI transcription program.  Despite best efforts to proofread, errors can occur which can change the meaning. Any transcriptional errors that result from this process are unintentional and may not be fully corrected at the time of dictation.

## 2023-06-23 NOTE — Patient Instructions (Signed)
 VISIT SUMMARY:  You came in today for a follow-up on your respiratory condition. We discussed your current symptoms and reviewed your treatment plan.  YOUR PLAN:  - ASTHMA/ASTHMATIC BRONCHITIS: Chronic airway inflammation means that your airways are persistently inflamed, which can make breathing difficult. Your condition is well-controlled with the Trelegy inhaler, which helps reduce inflammation and improve breathing. Your breathing tests are satisfactory, and there is no wheezing. Continue using the Trelegy inhaler as prescribed. We will provide additional samples and send a prescription to your pharmacy.  -POSTNASAL DRIP: Postnasal drip occurs when excess mucus from the nose drips down the back of the throat, which can cause a mild cough. You are using Flonase  as needed for symptom relief, which is likely related to allergies. Continue using Flonase  as needed.  -OBESITY: Obesity can contribute to shortness of breath when you exert yourself. Weight loss and improved physical conditioning are recommended to help alleviate these symptoms. Your previous heart test showed good function, so the main issue is likely deconditioning. We recommend a gradual weight loss of 10-15 pounds and encourage you to start regular physical activity slowly. If your shortness of breath continues, we may consider referring you to pulmonary rehabilitation.  INSTRUCTIONS:  Continue using the Trelegy inhaler and Flonase  as needed. We will send a prescription for Trelegy to your pharmacy. Aim for a gradual weight loss of 10-15 pounds and start regular physical activity slowly. If your shortness of breath persists, we may refer you to pulmonary rehabilitation.

## 2023-06-23 NOTE — Patient Instructions (Signed)
 Full PFT completed today ? ?

## 2023-06-24 ENCOUNTER — Encounter: Payer: Self-pay | Admitting: Pulmonary Disease

## 2023-06-25 ENCOUNTER — Ambulatory Visit: Payer: Self-pay | Admitting: Pulmonary Disease

## 2023-06-28 ENCOUNTER — Other Ambulatory Visit: Payer: Self-pay | Admitting: Cardiovascular Disease

## 2023-06-28 DIAGNOSIS — I050 Rheumatic mitral stenosis: Secondary | ICD-10-CM

## 2023-06-28 DIAGNOSIS — I5032 Chronic diastolic (congestive) heart failure: Secondary | ICD-10-CM

## 2023-06-28 DIAGNOSIS — Z954 Presence of other heart-valve replacement: Secondary | ICD-10-CM

## 2023-06-29 ENCOUNTER — Ambulatory Visit: Attending: Cardiovascular Disease

## 2023-06-29 ENCOUNTER — Other Ambulatory Visit
Admission: RE | Admit: 2023-06-29 | Discharge: 2023-06-29 | Disposition: A | Discharge status: Home / Self Care | Attending: Nephrology | Admitting: Nephrology

## 2023-06-29 DIAGNOSIS — I050 Rheumatic mitral stenosis: Secondary | ICD-10-CM | POA: Diagnosis not present

## 2023-06-29 DIAGNOSIS — E876 Hypokalemia: Secondary | ICD-10-CM | POA: Insufficient documentation

## 2023-06-29 DIAGNOSIS — I129 Hypertensive chronic kidney disease with stage 1 through stage 4 chronic kidney disease, or unspecified chronic kidney disease: Secondary | ICD-10-CM | POA: Diagnosis not present

## 2023-06-29 DIAGNOSIS — N200 Calculus of kidney: Secondary | ICD-10-CM | POA: Insufficient documentation

## 2023-06-29 DIAGNOSIS — R600 Localized edema: Secondary | ICD-10-CM | POA: Insufficient documentation

## 2023-06-29 DIAGNOSIS — Z5181 Encounter for therapeutic drug level monitoring: Secondary | ICD-10-CM | POA: Diagnosis not present

## 2023-06-29 DIAGNOSIS — I4891 Unspecified atrial fibrillation: Secondary | ICD-10-CM | POA: Diagnosis not present

## 2023-06-29 DIAGNOSIS — N2581 Secondary hyperparathyroidism of renal origin: Secondary | ICD-10-CM | POA: Insufficient documentation

## 2023-06-29 DIAGNOSIS — N184 Chronic kidney disease, stage 4 (severe): Secondary | ICD-10-CM | POA: Insufficient documentation

## 2023-06-29 LAB — RENAL FUNCTION PANEL
Albumin: 4 g/dL (ref 3.5–5.0)
Anion gap: 10 (ref 5–15)
BUN: 41 mg/dL — ABNORMAL HIGH (ref 6–20)
CO2: 24 mmol/L (ref 22–32)
Calcium: 9.3 mg/dL (ref 8.9–10.3)
Chloride: 102 mmol/L (ref 98–111)
Creatinine, Ser: 2.81 mg/dL — ABNORMAL HIGH (ref 0.44–1.00)
GFR, Estimated: 19 mL/min — ABNORMAL LOW (ref >60)
Glucose, Bld: 99 mg/dL (ref 70–99)
Phosphorus: 3.8 mg/dL (ref 2.5–4.6)
Potassium: 4.6 mmol/L (ref 3.5–5.1)
Sodium: 136 mmol/L (ref 135–145)

## 2023-06-29 LAB — POCT INR: INR: 2.6 (ref 2.0–3.0)

## 2023-06-29 LAB — CBC WITH DIFFERENTIAL/PLATELET
Abs Immature Granulocytes: 0.02 10*3/uL (ref 0.00–0.07)
Basophils Absolute: 0 10*3/uL (ref 0.0–0.1)
Basophils Relative: 1 %
Eosinophils Absolute: 0.2 10*3/uL (ref 0.0–0.5)
Eosinophils Relative: 4 %
HCT: 34.2 % — ABNORMAL LOW (ref 36.0–46.0)
Hemoglobin: 11.2 g/dL — ABNORMAL LOW (ref 12.0–15.0)
Immature Granulocytes: 0 %
Lymphocytes Relative: 33 %
Lymphs Abs: 1.9 10*3/uL (ref 0.7–4.0)
MCH: 29.3 pg (ref 26.0–34.0)
MCHC: 32.7 g/dL (ref 30.0–36.0)
MCV: 89.5 fL (ref 80.0–100.0)
Monocytes Absolute: 0.6 10*3/uL (ref 0.1–1.0)
Monocytes Relative: 10 %
Neutro Abs: 3 10*3/uL (ref 1.7–7.7)
Neutrophils Relative %: 52 %
Platelets: 333 10*3/uL (ref 150–400)
RBC: 3.82 MIL/uL — ABNORMAL LOW (ref 3.87–5.11)
RDW: 13.9 % (ref 11.5–15.5)
WBC: 5.9 10*3/uL (ref 4.0–10.5)
nRBC: 0 % (ref 0.0–0.2)

## 2023-06-29 LAB — URINALYSIS, ROUTINE W REFLEX MICROSCOPIC
Bilirubin Urine: NEGATIVE
Glucose, UA: 50 mg/dL — AB
Hgb urine dipstick: NEGATIVE
Ketones, ur: NEGATIVE mg/dL
Leukocytes,Ua: NEGATIVE
Nitrite: NEGATIVE
Protein, ur: NEGATIVE mg/dL
Specific Gravity, Urine: 1.016 (ref 1.005–1.030)
pH: 5 (ref 5.0–8.0)

## 2023-06-29 LAB — PROTEIN / CREATININE RATIO, URINE
Creatinine, Urine: 200 mg/dL
Protein Creatinine Ratio: 0.04 mg/mg{creat} (ref 0.00–0.15)
Total Protein, Urine: 7 mg/dL

## 2023-06-29 NOTE — Patient Instructions (Signed)
continue 1 tablet daily except 1/2 tablet on Mondays and Fridays. Stay consistent with greens.  - Recheck INR in 6 weeks [phone]

## 2023-06-30 ENCOUNTER — Encounter: Payer: Medicare Other | Admitting: Physician Assistant

## 2023-06-30 LAB — PARATHYROID HORMONE, INTACT (NO CA): PTH: 73 pg/mL — ABNORMAL HIGH (ref 15–65)

## 2023-07-06 ENCOUNTER — Other Ambulatory Visit: Payer: Self-pay | Admitting: Physician Assistant

## 2023-07-07 DIAGNOSIS — E876 Hypokalemia: Secondary | ICD-10-CM | POA: Diagnosis not present

## 2023-07-07 DIAGNOSIS — E790 Hyperuricemia without signs of inflammatory arthritis and tophaceous disease: Secondary | ICD-10-CM | POA: Diagnosis not present

## 2023-07-07 DIAGNOSIS — N2581 Secondary hyperparathyroidism of renal origin: Secondary | ICD-10-CM | POA: Diagnosis not present

## 2023-07-07 DIAGNOSIS — N1832 Chronic kidney disease, stage 3b: Secondary | ICD-10-CM | POA: Diagnosis not present

## 2023-07-07 DIAGNOSIS — I129 Hypertensive chronic kidney disease with stage 1 through stage 4 chronic kidney disease, or unspecified chronic kidney disease: Secondary | ICD-10-CM | POA: Diagnosis not present

## 2023-07-10 ENCOUNTER — Other Ambulatory Visit: Payer: Self-pay | Admitting: Physician Assistant

## 2023-07-11 ENCOUNTER — Encounter: Payer: Self-pay | Admitting: Medical

## 2023-07-11 ENCOUNTER — Ambulatory Visit: Admitting: Cardiovascular Disease

## 2023-07-11 ENCOUNTER — Ambulatory Visit: Attending: Medical | Admitting: Medical

## 2023-07-11 VITALS — BP 130/66 | HR 57 | Ht 64.0 in | Wt 234.2 lb

## 2023-07-11 DIAGNOSIS — N179 Acute kidney failure, unspecified: Secondary | ICD-10-CM | POA: Diagnosis not present

## 2023-07-11 DIAGNOSIS — N184 Chronic kidney disease, stage 4 (severe): Secondary | ICD-10-CM

## 2023-07-11 DIAGNOSIS — Z79899 Other long term (current) drug therapy: Secondary | ICD-10-CM

## 2023-07-11 DIAGNOSIS — I480 Paroxysmal atrial fibrillation: Secondary | ICD-10-CM | POA: Diagnosis not present

## 2023-07-11 DIAGNOSIS — I5032 Chronic diastolic (congestive) heart failure: Secondary | ICD-10-CM | POA: Diagnosis not present

## 2023-07-11 DIAGNOSIS — I050 Rheumatic mitral stenosis: Secondary | ICD-10-CM

## 2023-07-11 DIAGNOSIS — Z954 Presence of other heart-valve replacement: Secondary | ICD-10-CM

## 2023-07-11 MED ORDER — TORSEMIDE 10 MG PO TABS: 10.0000 mg | ORAL_TABLET | Freq: Two times a day (BID) | ORAL | 1 refills | Status: DC

## 2023-07-11 MED ORDER — SPIRONOLACTONE 25 MG PO TABS: 12.5000 mg | ORAL_TABLET | Freq: Every day | ORAL | 1 refills | Status: DC

## 2023-07-11 NOTE — Patient Instructions (Signed)
 Medication Instructions:  Your physician recommends the following medication changes.  DECREASE: Spironolactone  25 mg to 12.5 mg by mouth daily Torsemide  20 mg to 10 mg by mouth daily  Continue to take all other medications as directed.  *If you need a refill on your cardiac medications before your next appointment, please call your pharmacy*  Lab Work:  Your provider would like for you to have following labs drawn today BMET.   If you have labs (blood work) drawn today and your tests are completely normal, you will receive your results only by: MyChart Message (if you have MyChart) OR A paper copy in the mail If you have any lab test that is abnormal or we need to change your treatment, we will call you to review the results.  Your provider would like for you to return in 2 WEEkS to have the following labs drawn: BMET.   Please go to [facility] [address] ([facility] [address], [place] [address] You do not need an appointment.  They are open from 8 am- 4:30 pm.  Lunch from 1:00 pm- 2:00 pm   Testing/Procedures:  No test ordered today   Follow-Up: At [facility], you and your health needs are our priority.  As part of our continuing mission to provide you with exceptional heart care, our providers are all part of one team.  This team includes your primary Cardiologist (physician) and Advanced Practice Providers or APPs (Physician Assistants and Nurse Practitioners) who all work together to provide you with the care you need, when you need it.  Your next appointment:   3 month(s)  Provider:   You may see Timothy Gollan, MD or one of the following Advanced Practice Providers on your designated Care Team:    Cadence Roderfield, PA-C

## 2023-07-11 NOTE — Progress Notes (Signed)
 Cardiology Office Note   Date:  07/11/2023  ID:  Leslie [name], DOB 01-01-1966, MRN [id number] PCP: Kristina Tinnie POUR, PA-C  [facility] Providers Cardiologist:  Evalene Lunger, MD   History of Present Illness Leslie [name] is a 58 y.o. female  with a hx of  mitral valve stenosis s/p MVR with concomitant MAZE procedure, morbid obesity, HLD, COPD, CKD stage 4 who presents for follow-up.   The patient was hospitalized in 08/2017 for chest tightness. She was found to be in afib RVR and underwent TEE that showed moderate mitral stenosis and she was cardioverted. Cardiac CTA  in 2020 for chest tightness showed nonobstructive CAD. She had progressive mitral stenosis on serial echocardiograms with severe mitral stenosis on echo in 06/2019. R/L heart cath showed moderate pulmonary HTN with no significant coronary disease. Patient was referred to Dr. Dusty of CT surgery and and she underwent mitral valve replacement with ON-X bileaflet mechanical valve with MAZE procedure in 04/2020. She was started on coumadin  post-procedure. Follow-up echo 05/2020 showed EF 50-55% with no MV regurgitation. Heart monitor showed NSR, average HR 77bpm, 7 runs of SVT, longest lasting 6 beats, no afib.    Echo in 2023 showed well functioning valve.    The patient was seen 12/06/22 reporting chest and back pain. PET stress test showed small perfusion defect related to apical thinning, cannot exclude mild apical ischemia. Defect 1: small defect with mild reduction in uptake present in the apical anterior and lateral location that is reversible. There is normal wall motion, consistent with artifact. End diastolic cavity was normal. Coronary calcium  present on attenuation correction CT images, mild coronary calcifications present, mitral valve replacement.   Echo 01/2023 showed LVEF 60-65%, no WMA, mitral valve that has been repaired/replaced with a mean gradient of .    The patient was last seen 04/08/23 and was  stable from a cardiac perspective.   Today, the patient reports she is overall Ok from a cardiac perspective. She saw pulmonology and was placed on trelegy. It's helping her breathing. She denies chest pain. Breathing is a little short at times. She feels heart skipping a beat at times. These are not affecting her quality of life. She notices them at night when laying down. She reports muscle pain in her legs and is having trouble walking at times. Most recent labs showed elevated Scr/BUN. She said nephrology told her to take Torsemide  as needed, but she has not been doing this.   Studies Reviewed EKG Interpretation Date/Time:  Monday July 11 2023 13:48:07 EDT Ventricular Rate:  57 PR Interval:  184 QRS Duration:  76 QT Interval:  444 QTC Calculation: 432 R Axis:   29  Text Interpretation: Sinus bradycardia Low voltage QRS When compared with ECG of 07-Jan-2023 14:17, No significant change was found Confirmed by Franchester, Morelia Cassells ([id number]) on 07/11/2023 2:11:09 PM    Echo 01/2023 1. Left ventricular ejection fraction, by estimation, is 60 to 65%. The  left ventricle has normal function. The left ventricle has no regional  wall motion abnormalities. Left ventricular diastolic parameters are  indeterminate. The average left  ventricular global longitudinal strain is -17.3 %. The global longitudinal  strain is normal.   2. Right ventricular systolic function is normal. The right ventricular  size is normal.   3. The mitral valve has been repaired/replaced. No evidence of mitral  valve regurgitation. The mean mitral valve gradient is 5.0 mmHg. There is  a bioprosthetic valve present in the mitral  position.   4. The aortic valve was not well visualized. Aortic valve regurgitation  is not visualized.   5. The inferior vena cava is normal in size with greater than 50%  respiratory variability, suggesting right atrial pressure of 3 mmHg.      Cardiac PET stress 12/2022   Findings are consistent  with no infarction. The study is intermediate risk.  Likely, small perfusion defect is related to apical thinning, as stress flow is normal.  Cannot exclude mild apical ischemia.   LV perfusion is abnormal. Defect 1: There is a small defect with mild reduction in uptake present in the apical anterior and lateral location(s) that is reversible. There is normal wall motion in the defect area. Consistent with artifact.   End diastolic cavity size is normal. End systolic cavity size is normal.   Myocardial blood flow was computed to be 0.58ml/g/min at rest and 2.01ml/g/min at stress. Global myocardial blood flow reserve was 2.07 and was normal.   Coronary calcium  was present on the attenuation correction CT images. Mild coronary calcifications were present. Mitral valve replacement noted with post procedural pericardial calcification.  Aortic atherosclerosis noted. Coronary calcifications were present in the left anterior descending artery and right coronary artery distribution(s).   Electronically Signed  By: Stanly Leavens M.D.      Physical Exam VS:  BP 130/66   Pulse (!) 57   Ht 5' 4 (1.626 m)   Wt 234 lb 3.2 oz (106.2 kg)   SpO2 98%   BMI 40.20 kg/m        Wt Readings from Last 3 Encounters:  07/11/23 234 lb 3.2 oz (106.2 kg)  06/23/23 234 lb 3.2 oz (106.2 kg)  06/23/23 234 lb 3.2 oz (106.2 kg)    GEN: Well nourished, well developed in no acute distress NECK: No JVD; No carotid bruits CARDIAC: RRR, no murmurs, rubs, gallops RESPIRATORY:  Clear to auscultation without rales, wheezing or rhonchi  ABDOMEN: Soft, non-tender, non-distended EXTREMITIES:  No edema; No deformity   ASSESSMENT AND PLAN  AKI on CKD stage 4 Recent labs showed SCR 1.88>2.81, BUN up 43>41. She says she was told by nephrology to take Torsemide  as needed, but says swelling is not well controlled with that. She is on spironolactone  25mg  daily, torsemide  20mg  daily, and Losartan 25mg  daily. We will decreased  Torsemide  to 20mg  daily and spiro to 12.5mg  daily. Repeat BMET today and in 2 weeks. Continue to follow with nephrology.   S/p MVR Echo showed normal LVEF with normally functioning valve with mean gradient of  Pafib s/p MAZE The patient is in sinus bradycardia. She reports occasional palpitations when laying down, these are tolerable and do not affect quality of life. Continue coumadin  and Toprol .   Chronic diastolic heart failure.  The patient is euvolemic on exam. Changes to diuretics as above.   Myalgias HLD TG 264, TC 178, HDL 37, LDL 88. We will do a 6 week statin holiday. Hold Lipitor , continue Zetia  10mg  daily.         Dispo: Follow-up in 3 months  Signed, Taela Charbonneau VEAR Fishman, PA-C

## 2023-07-12 ENCOUNTER — Ambulatory Visit: Payer: Self-pay | Admitting: Medical

## 2023-07-12 ENCOUNTER — Telehealth: Payer: Self-pay

## 2023-07-12 LAB — BASIC METABOLIC PANEL WITH GFR
BUN/Creatinine Ratio: 15 (ref 9–23)
BUN: 34 mg/dL — ABNORMAL HIGH (ref 6–24)
CO2: 24 mmol/L (ref 20–29)
Calcium: 9.3 mg/dL (ref 8.7–10.2)
Chloride: 100 mmol/L (ref 96–106)
Creatinine, Ser: 2.26 mg/dL — ABNORMAL HIGH (ref 0.57–1.00)
Glucose: 85 mg/dL (ref 70–99)
Potassium: 4.9 mmol/L (ref 3.5–5.2)
Sodium: 140 mmol/L (ref 134–144)
eGFR: 25 mL/min/{1.73_m2} — ABNORMAL LOW (ref >59)

## 2023-07-12 NOTE — Telephone Encounter (Signed)
 Notified patient that patient assistance Ozempic  is ready for pick-up.

## 2023-07-15 ENCOUNTER — Ambulatory Visit: Admitting: Urology

## 2023-07-19 ENCOUNTER — Other Ambulatory Visit: Payer: Self-pay | Admitting: Physician Assistant

## 2023-07-19 DIAGNOSIS — F419 Anxiety disorder, unspecified: Secondary | ICD-10-CM

## 2023-07-21 ENCOUNTER — Ambulatory Visit: Admitting: Physician Assistant

## 2023-07-21 ENCOUNTER — Encounter: Payer: Self-pay | Admitting: Physician Assistant

## 2023-07-21 VITALS — BP 138/78 | HR 75 | Temp 98.4°F | Resp 16 | Ht 64.0 in | Wt 234.8 lb

## 2023-07-21 DIAGNOSIS — R300 Dysuria: Secondary | ICD-10-CM

## 2023-07-21 DIAGNOSIS — N390 Urinary tract infection, site not specified: Secondary | ICD-10-CM

## 2023-07-21 DIAGNOSIS — Z113 Encounter for screening for infections with a predominantly sexual mode of transmission: Secondary | ICD-10-CM

## 2023-07-21 DIAGNOSIS — J0100 Acute maxillary sinusitis, unspecified: Secondary | ICD-10-CM

## 2023-07-21 LAB — POCT URINALYSIS DIPSTICK
Bilirubin, UA: NEGATIVE
Blood, UA: NEGATIVE
Glucose, UA: POSITIVE — AB
Ketones, UA: POSITIVE
Nitrite, UA: NEGATIVE
Protein, UA: NEGATIVE
Spec Grav, UA: 1.01 (ref 1.010–1.025)
Urobilinogen, UA: 0.2 U/dL
pH, UA: 5 (ref 5.0–8.0)

## 2023-07-21 MED ORDER — AMOXICILLIN-POT CLAVULANATE 875-125 MG PO TABS: 1.0000 | ORAL_TABLET | Freq: Two times a day (BID) | ORAL | 0 refills | Status: DC

## 2023-07-21 MED ORDER — FLUCONAZOLE 150 MG PO TABS: ORAL_TABLET | ORAL | 0 refills | Status: DC

## 2023-07-21 NOTE — Progress Notes (Signed)
 [facility] [address] [place], [place] [address]  Internal MEDICINE  Office Visit Note  Patient Name: Leslie [name]  [id number]  [id number]  Date of Service: 07/21/2023  Chief Complaint  Patient presents with   Acute Visit   Urinary Tract Infection   Sinusitis     HPI Pt is here for a sick visit. -has recurrent UTIs, supposed to see urology but appt keeps getting changed. Appt is now set for end of the month -having UTI symptoms again now -some vaginal discharge as well -Sinus congestion and cough for 4 days, taking mucinex  Current Medication:  Outpatient Encounter Medications as of 07/21/2023  Medication Sig   acetaminophen -codeine  (TYLENOL  #3) 300-30 MG tablet Take 1 tablet by mouth every 6 (six) hours as needed for severe pain (pain score 7-10).   albuterol  (VENTOLIN  HFA) 108 (90 Base) MCG/ACT inhaler Inhale 2 puffs into the lungs every 6 (six) hours as needed for wheezing or shortness of breath.   allopurinol (ZYLOPRIM) 100 MG tablet Take 100 mg by mouth daily.   ALPRAZolam  (XANAX ) 0.5 MG tablet TAKE 1/2 TABLET BY MOUTH AT BEDTIME AS NEEDED FOR ANXIETY   amLODipine  (NORVASC ) 5 MG tablet Take 1 tablet (5 mg total) by mouth daily.   amoxicillin -clavulanate (AUGMENTIN ) 875-125 MG tablet Take 1 tablet by mouth 2 (two) times daily. Take with food.   aspirin  81 MG EC tablet Take 81 mg by mouth daily.   atorvastatin  (LIPITOR ) 80 MG tablet Take 1 tablet (80 mg total) by mouth daily.   bisacodyl  (DULCOLAX) 5 MG EC tablet One tab at night Monday wed and friday   calcium  carbonate (TUMS - DOSED IN MG ELEMENTAL CALCIUM ) 500 MG chewable tablet Chew 500 mg by mouth daily as needed for indigestion or heartburn.   dapagliflozin propanediol (FARXIGA) 10 MG TABS tablet Take 10 mg by mouth daily.   estradiol  (ESTRACE ) 0.1 MG/GM vaginal cream 1/2 applicator once a week   ezetimibe  (ZETIA ) 10 MG tablet Take 1 tablet (10 mg total) by mouth daily.   famciclovir  (FAMVIR ) 500 MG  tablet TAKE ONE TAB BY MOUTH TWICE A DAY FOR INFECTION   Fluticasone -Umeclidin-Vilant (TRELEGY ELLIPTA ) 100-62.5-25 MCG/ACT AEPB Inhale 1 puff into the lungs daily.   Fluticasone -Umeclidin-Vilant (TRELEGY ELLIPTA ) 100-62.5-25 MCG/ACT AEPB Inhale 1 puff into the lungs daily in the afternoon.   hyoscyamine  (LEVBID ) 0.375 MG 12 hr tablet Take 1 tablet (0.375 mg total) by mouth 2 (two) times daily.   KLOR-CON  M20 20 MEQ tablet TAKE 2 TABLETS BY MOUTH TWICE A DAY   losartan (COZAAR) 25 MG tablet Take 1 tablet by mouth daily.   metoprolol  succinate (TOPROL -XL) 100 MG 24 hr tablet Take 1 tablet (100 mg total) by mouth daily.   nitrofurantoin , macrocrystal-monohydrate, (MACROBID ) 100 MG capsule Take 1 cap twice per day for 10 days.   omeprazole  (PRILOSEC) 40 MG capsule TAKE 1 CAPSULE (40 MG TOTAL) BY MOUTH DAILY.   phenazopyridine  (PYRIDIUM ) 200 MG tablet Take 1 tablet (200 mg total) by mouth 3 (three) times daily as needed for pain.   Semaglutide ,0.25 or 0.5MG /DOS, (OZEMPIC , 0.25 OR 0.5 MG/DOSE,) 2 MG/3ML SOPN Take 0.5 mg weekly.   spironolactone  (ALDACTONE ) 25 MG tablet Take 0.5 tablets (12.5 mg total) by mouth daily.   torsemide  (DEMADEX ) 10 MG tablet Take 1 tablet (10 mg total) by mouth 2 (two) times daily.   traZODone  (DESYREL ) 50 MG tablet TAKE 1 TABLET BY MOUTH EVERYDAY AT BEDTIME   Vitamin D , Ergocalciferol , (DRISDOL ) 1.25  MG (50000 UNIT) CAPS capsule Take 1 capsule (50,000 Units total) by mouth every 7 (seven) days.   warfarin (COUMADIN ) 2 MG tablet TAKE 1-2 TABLETS DAILY OR AS PRESCRIBED BY COUMADIN  CLINIC   [DISCONTINUED] fluconazole  (DIFLUCAN ) 150 MG tablet Take 1 tab po once may repeat in 3 days if symptoms persist   fluconazole  (DIFLUCAN ) 150 MG tablet Take 1 tab po once may repeat in 3 days if symptoms persist   No facility-administered encounter medications on file as of 07/21/2023.      Medical History: Past Medical History:  Diagnosis Date   (HFpEF) heart failure with preserved  ejection fraction (HCC)    a. 08/2017 Echo: EF 55-60%.  Grade 2 diastolic dysfunction; b. 05/2020 Echo: EF 50-55%, no rwma, Nl RV fxn, nl fxn'ing mech MV.   Allergy    Anemia    Anxiety    Asthma    BRCA negative 03/22/2013   Bronchitis 02/19/2016   ON LEVAQUIN  PO   Chest tightness    a. 08/2019 Cath: nl cors.   Cigarette smoker 09/11/2017   8-10 day   Dysrhythmia    GERD (gastroesophageal reflux disease)    History of kidney stones    Hyperlipidemia    Hypertension    Interstitial lung disease (HCC)    a. CT 2013 b. 02/2018 CXR noted recurrent intersistial changes ILD vs chronic bronchitis   Moderate mitral stenosis    a.  08/2017 TEE: EF 60 to 65%.  Moderate mitral stenosis.  Mean gradient 14 mmHg.  Valve area 2.59 cm by planimetry, 2.72 cm by pressure half-time.   PAF (paroxysmal atrial fibrillation) (HCC)    a. 08/2017 s/p TEE/DCCV; b. CHA2DS2VASc = 2-->warfarin.   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 04/2020 s/p 27/29 mm Onyx mech mitral valve-->chronic coumadin ; b. 05/2020 Echo: Nl fxn'ing mech MV.     Vital Signs: BP 138/78   Pulse 75   Temp 98.4 F (36.9 C)   Resp 16   Ht 5' 4 (1.626 m)   Wt 234 lb 12.8 oz (106.5 kg)   SpO2 99%   BMI 40.30 kg/m    Review of Systems  Constitutional:  Negative for fever.  HENT:  Positive for congestion, postnasal drip, sinus pressure and sinus pain.   Respiratory:  Positive for cough. Negative for chest tightness, shortness of breath and wheezing.   Cardiovascular: Negative.  Negative for chest pain and palpitations.  Genitourinary:  Positive for dysuria, flank pain, frequency, urgency and vaginal discharge.  Musculoskeletal:  Positive for back pain.  Skin:  Negative for rash.   Psychiatric/Behavioral:  Negative for dysphoric mood, self-injury and suicidal ideas.     Physical Exam Vitals and nursing note reviewed.  Constitutional:      Appearance: Normal appearance.  HENT:     Head: Normocephalic and atraumatic.  Eyes:     Extraocular Movements: Extraocular movements intact.  Cardiovascular:     Rate and Rhythm: Normal rate and regular rhythm.     Pulses: Normal pulses.     Heart sounds: Normal heart sounds.  Pulmonary:     Effort: Pulmonary effort is normal.     Breath sounds: Normal breath sounds.  Skin:    General: Skin is warm and dry.  Neurological:  General: No focal deficit present.     Mental Status: She is alert.  Psychiatric:        Mood and Affect: Mood normal.        Behavior: Behavior normal.       Assessment/Plan: 1. Urinary tract infection without hematuria, site unspecified (Primary) Will start augmentin  to potentially cover UTI and sinusitis. Will adjust based on C/S. Pt will be seeing urology in future - CULTURE, URINE COMPREHENSIVE - amoxicillin -clavulanate (AUGMENTIN ) 875-125 MG tablet; Take 1 tablet by mouth 2 (two) times daily. Take with food.  Dispense: 14 tablet; Refill: 0 - fluconazole  (DIFLUCAN ) 150 MG tablet; Take 1 tab po once may repeat in 3 days if symptoms persist  Dispense: 3 tablet; Refill: 0  2. Screening for STDs (sexually transmitted diseases) - NuSwab Vaginitis Plus (VG+)  3. Acute non-recurrent maxillary sinusitis - amoxicillin -clavulanate (AUGMENTIN ) 875-125 MG tablet; Take 1 tablet by mouth 2 (two) times daily. Take with food.  Dispense: 14 tablet; Refill: 0  4. Dysuria - POCT Urinalysis Dipstick   General Counseling: Brianni verbalizes understanding of the findings of todays visit and agrees with plan of treatment. I have discussed any further diagnostic evaluation that may be needed or ordered today. We also reviewed her medications today. she has been encouraged to call the office with any  questions or concerns that should arise related to todays visit.    Counseling:    Orders Placed This Encounter  Procedures   CULTURE, URINE COMPREHENSIVE   NuSwab Vaginitis Plus (VG+)   POCT Urinalysis Dipstick    Meds ordered this encounter  Medications   amoxicillin -clavulanate (AUGMENTIN ) 875-125 MG tablet    Sig: Take 1 tablet by mouth 2 (two) times daily. Take with food.    Dispense:  14 tablet    Refill:  0   fluconazole  (DIFLUCAN ) 150 MG tablet    Sig: Take 1 tab po once may repeat in 3 days if symptoms persist    Dispense:  3 tablet    Refill:  0    Time spent:30 Minutes

## 2023-07-24 LAB — NUSWAB VAGINITIS PLUS (VG+)
Candida albicans, NAA: NEGATIVE
Candida glabrata, NAA: NEGATIVE
Chlamydia trachomatis, NAA: NEGATIVE
Neisseria gonorrhoeae, NAA: NEGATIVE
Trich vag by NAA: NEGATIVE

## 2023-07-26 ENCOUNTER — Other Ambulatory Visit: Payer: Self-pay | Admitting: Physician Assistant

## 2023-07-26 ENCOUNTER — Ambulatory Visit: Payer: Self-pay | Admitting: Physician Assistant

## 2023-07-26 LAB — CULTURE, URINE COMPREHENSIVE

## 2023-08-01 ENCOUNTER — Telehealth: Payer: Self-pay

## 2023-08-02 ENCOUNTER — Other Ambulatory Visit: Payer: Self-pay | Admitting: Physician Assistant

## 2023-08-02 DIAGNOSIS — J0100 Acute maxillary sinusitis, unspecified: Secondary | ICD-10-CM

## 2023-08-02 DIAGNOSIS — N390 Urinary tract infection, site not specified: Secondary | ICD-10-CM

## 2023-08-04 ENCOUNTER — Telehealth: Payer: Self-pay | Admitting: Urology

## 2023-08-04 ENCOUNTER — Other Ambulatory Visit
Admission: RE | Admit: 2023-08-04 | Discharge: 2023-08-04 | Disposition: A | Discharge status: Home / Self Care | Source: Ambulatory Visit | Attending: Medical | Admitting: Medical

## 2023-08-04 DIAGNOSIS — Z79899 Other long term (current) drug therapy: Secondary | ICD-10-CM | POA: Diagnosis not present

## 2023-08-04 LAB — BASIC METABOLIC PANEL WITH GFR
Anion gap: 12 (ref 5–15)
BUN: 31 mg/dL — ABNORMAL HIGH (ref 6–20)
CO2: 27 mmol/L (ref 22–32)
Calcium: 9.3 mg/dL (ref 8.9–10.3)
Chloride: 98 mmol/L (ref 98–111)
Creatinine, Ser: 2.23 mg/dL — ABNORMAL HIGH (ref 0.44–1.00)
GFR, Estimated: 25 mL/min — ABNORMAL LOW (ref >60)
Glucose, Bld: 101 mg/dL — ABNORMAL HIGH (ref 70–99)
Potassium: 4.2 mmol/L (ref 3.5–5.1)
Sodium: 137 mmol/L (ref 135–145)

## 2023-08-04 NOTE — Telephone Encounter (Signed)
 Contacted the patient regarding a scheduling error. Informed the patient that although they are still considered a new patient, they had previously seen Dr. Francisca on 03/27/2020. Therefore, per policy, they would need to be scheduled with Dr. Francisca, the provider they have seen in the past. The patient declined to reschedule and insisted on keeping the appointment with Dr. Twylla on 08/08/2023. The patient also stated they would request a referral to a different urologist from their current provider.

## 2023-08-04 NOTE — Telephone Encounter (Signed)
 FYI

## 2023-08-05 ENCOUNTER — Telehealth: Payer: Self-pay

## 2023-08-05 ENCOUNTER — Ambulatory Visit: Payer: Self-pay | Admitting: Medical

## 2023-08-05 ENCOUNTER — Other Ambulatory Visit: Payer: Self-pay

## 2023-08-05 DIAGNOSIS — J0100 Acute maxillary sinusitis, unspecified: Secondary | ICD-10-CM

## 2023-08-05 DIAGNOSIS — N390 Urinary tract infection, site not specified: Secondary | ICD-10-CM

## 2023-08-05 MED ORDER — AMOXICILLIN-POT CLAVULANATE 875-125 MG PO TABS: 1.0000 | ORAL_TABLET | Freq: Two times a day (BID) | ORAL | 0 refills | Status: DC

## 2023-08-05 NOTE — Telephone Encounter (Signed)
 Spoke with patient, sent 3 days of Augmentin  for UTI symptoms per Lauren. Patient requested new referral since [facility] is not able to see her until August 1st. Advised patient that Tinnie is able to send a new referral, but it is possible that she may not be seen sooner than August 1st. Patient was in agreement with having new referral placed regardless.

## 2023-08-05 NOTE — Telephone Encounter (Signed)
 Pt advised and sent med by [facility]

## 2023-08-06 ENCOUNTER — Other Ambulatory Visit: Payer: Self-pay | Admitting: Physician Assistant

## 2023-08-07 DIAGNOSIS — M25562 Pain in left knee: Secondary | ICD-10-CM | POA: Diagnosis not present

## 2023-08-07 DIAGNOSIS — M25561 Pain in right knee: Secondary | ICD-10-CM | POA: Diagnosis not present

## 2023-08-08 ENCOUNTER — Ambulatory Visit: Admitting: Urology

## 2023-08-08 ENCOUNTER — Other Ambulatory Visit: Payer: Self-pay | Admitting: Physician Assistant

## 2023-08-08 DIAGNOSIS — Z87440 Personal history of urinary (tract) infections: Secondary | ICD-10-CM

## 2023-08-08 DIAGNOSIS — N200 Calculus of kidney: Secondary | ICD-10-CM

## 2023-08-10 ENCOUNTER — Ambulatory Visit: Attending: Cardiovascular Disease

## 2023-08-10 ENCOUNTER — Telehealth: Payer: Self-pay | Admitting: Physician Assistant

## 2023-08-10 DIAGNOSIS — I050 Rheumatic mitral stenosis: Secondary | ICD-10-CM

## 2023-08-10 DIAGNOSIS — Z5181 Encounter for therapeutic drug level monitoring: Secondary | ICD-10-CM | POA: Diagnosis not present

## 2023-08-10 DIAGNOSIS — I4891 Unspecified atrial fibrillation: Secondary | ICD-10-CM | POA: Diagnosis not present

## 2023-08-10 LAB — POCT INR: INR: 2.8 (ref 2.0–3.0)

## 2023-08-10 NOTE — Telephone Encounter (Addendum)
 Patient requesting urology referral be sent to different office. Stated she was okay going to [facility] [employer]. I faxed referral to [facility]; [phone]. 2nd time urology referral sent out. Notified patient & gave her tele [phone]

## 2023-08-10 NOTE — Patient Instructions (Signed)
 continue 1 tablet daily except 1/2 tablet on Mondays and Fridays. Stay consistent with greens.  - Recheck INR in 6 weeks [phone] Prednisone  Taper recently;

## 2023-08-12 DIAGNOSIS — S83422A Sprain of lateral collateral ligament of left knee, initial encounter: Secondary | ICD-10-CM | POA: Diagnosis not present

## 2023-08-15 ENCOUNTER — Encounter: Payer: Self-pay | Admitting: Urology

## 2023-08-22 DIAGNOSIS — S83422D Sprain of lateral collateral ligament of left knee, subsequent encounter: Secondary | ICD-10-CM | POA: Diagnosis not present

## 2023-08-28 ENCOUNTER — Other Ambulatory Visit: Payer: Self-pay | Admitting: Cardiovascular Disease

## 2023-09-16 ENCOUNTER — Other Ambulatory Visit: Payer: Self-pay | Admitting: Physician Assistant

## 2023-09-19 ENCOUNTER — Other Ambulatory Visit: Payer: Self-pay | Admitting: Physician Assistant

## 2023-09-19 DIAGNOSIS — F419 Anxiety disorder, unspecified: Secondary | ICD-10-CM

## 2023-09-21 ENCOUNTER — Telehealth: Payer: Self-pay | Admitting: Cardiovascular Disease

## 2023-09-21 ENCOUNTER — Ambulatory Visit: Attending: Cardiovascular Disease

## 2023-09-21 DIAGNOSIS — I050 Rheumatic mitral stenosis: Secondary | ICD-10-CM | POA: Diagnosis not present

## 2023-09-21 DIAGNOSIS — Z5181 Encounter for therapeutic drug level monitoring: Secondary | ICD-10-CM

## 2023-09-21 DIAGNOSIS — I4891 Unspecified atrial fibrillation: Secondary | ICD-10-CM

## 2023-09-21 LAB — POCT INR: INR: 1.8 — AB (ref 2.0–3.0)

## 2023-09-21 NOTE — Patient Instructions (Signed)
 Take 2 tablets today only then continue 1 tablet daily except 1/2 tablet on Mondays and Fridays. Stay consistent with greens.  - Recheck INR in 3 weeks [phone]

## 2023-09-21 NOTE — Telephone Encounter (Signed)
 Called and spoke with patient. Patient with complaint if intermittent chest pain, shortness of breath and nausea for 2-3 days. Patient reports that symptoms improve with Ibuprofen and her inhaler. Patient denies any current symptoms. Patient states that she does not have any recent vital signs. Patient scheduled to be seen in clinic 09/22/23. Patient made aware of ED precaution should any new symptoms develop or worsen.

## 2023-09-21 NOTE — Telephone Encounter (Signed)
 Spoke with patient, she is having some cp symptoms and some sob, so I moved her appt up with Dr.Gollan on 9/9. Just wanted to let you know. Thanks, Tinnie

## 2023-09-22 ENCOUNTER — Ambulatory Visit: Admitting: Cardiology

## 2023-09-22 NOTE — Progress Notes (Deleted)
 Cardiology Office Note   Date:  09/22/2023  ID:  Leslie [name], DOB 10-02-1965, MRN [id number] PCP: Kristina Tinnie [name]   HeartCare Providers Cardiologist:  Evalene Lunger, MD { Click to update primary MD,subspecialty MD or APP then REFRESH:1}    History of Present Illness Leslie [name] is a 58 y.o. female with a past medical history of mitral valve stenosis status post MVR with concomitant Maze procedure, morbid obesity, hyperlipidemia, COPD, CKD stage IV, who presents today with complaints of shortness of breath and chest tightness.   She was hospitalized 08/2017 for chest tightness.  She was found to be in A-fib RVR and underwent TEE that showed moderate mitral stenosis and she was cardioverted.  Cardiac CTA in 2020 for chest tightness showed nonobstructive CAD.  She had progressive mitral stenosis on serial echocardiograms with severe mitral stenosis on echo in 06/2019.  Right and left heart catheterization showed moderate pulmonary hypertension with no other significant coronary artery disease.  Patient was referred to Dr. Leontine of CT surgery and she underwent mitral valve replacement with ON-X bileaflet mitral valve with maze procedure 04/2020.  She was started on Coumadin  postprocedure.  Follow-up echocardiogram 11/2020 showed an EF of 50-55% with no MV regurgitation.  Heart monitor showed normal sinus rhythm, average heart rate 77 bpm, 7 runs of SVT, lasting 6 beats with no atrial fibrillation noted.  Repeat echocardiogram in 2023 showed normal functioning valve.  She was seen 11/2022 reporting chest and back pain.  PET stress showed small perfusion defect related to apical thinning but cannot exclude mild apical ischemia.  Defect 1 was a small defect with mild reduction in uptake present in the apical anterior and lateral location that was reversible.  There was normal wall motion consistent with artifact.  And systolic cavity was normal.  Coronary calcium  present on  attenuation correction CT images, mild coronary calcifications present, mitral valve replacement noted.  Echo in 01/2023 showed LVEF 60 to 65%, no RWMA, mitral valve that was repaired/replaced with a mean gradient of 5 mmHg.  She was evaluated in 04/08/2023 and was stable from the cardiac perspective.   She was last seen in clinic 07/11/2023 and overall was doing okay for the cardiac perspective.  She recently seen pulmonary and was placed on Trelegy to help with her breathing.  At the time she denied any chest discomfort.  Breathing was little short at times and she feels occasional palpitations.  She reported muscle pain in her legs and was having trouble walking at times.  Most recent labs showed elevated serum creatinine/BUN.  She said nephrology told her to take torsemide  as needed.  Spironolactone  was decreased to 12.5 twice but was decreased at her visit.  She was also scheduled for a BMP labor visit and again in 2 weeks.  She was also advised to hold her Lipitor  continue ezetimibe  and do a 6-week statin holiday due to myalgias.  She returns to clinic today after recent call and to the triage line complaining of shortness of breath and chest pain.  ROS: 10 point review of systems has been reviewed and considered negative except ones been listed in the HPI  Studies Reviewed     Echo 01/2023 1. Left ventricular ejection fraction, by estimation, is 60 to 65%. The  left ventricle has normal function. The left ventricle has no regional  wall motion abnormalities. Left ventricular diastolic parameters are  indeterminate. The average left  ventricular global longitudinal strain is -17.3 %. The  global longitudinal  strain is normal.   2. Right ventricular systolic function is normal. The right ventricular  size is normal.   3. The mitral valve has been repaired/replaced. No evidence of mitral  valve regurgitation. The mean mitral valve gradient is 5.0 mmHg. There is  a bioprosthetic valve present  in the mitral position.   4. The aortic valve was not well visualized. Aortic valve regurgitation  is not visualized.   5. The inferior vena cava is normal in size with greater than 50%  respiratory variability, suggesting right atrial pressure of 3 mmHg.     Cardiac PET stress 12/2022   Findings are consistent with no infarction. The study is intermediate risk.  Likely, small perfusion defect is related to apical thinning, as stress flow is normal.  Cannot exclude mild apical ischemia.   LV perfusion is abnormal. Defect 1: There is a small defect with mild reduction in uptake present in the apical anterior and lateral location(s) that is reversible. There is normal wall motion in the defect area. Consistent with artifact.   End diastolic cavity size is normal. End systolic cavity size is normal.   Myocardial blood flow was computed to be 0.47ml/g/min at rest and 2.01ml/g/min at stress. Global myocardial blood flow reserve was 2.07 and was normal.   Coronary calcium  was present on the attenuation correction CT images. Mild coronary calcifications were present. Mitral valve replacement noted with post procedural pericardial calcification.  Aortic atherosclerosis noted. Coronary calcifications were present in the left anterior descending artery and right coronary artery distribution(s).   Electronically Signed  By: Stanly Leavens M.D.  Risk Assessment/Calculations   No BP recorded.  {Refresh Note OR Click here to enter BP  :1}***       Physical Exam VS:  There were no vitals taken for this visit.       Wt Readings from Last 3 Encounters:  07/21/23 234 lb 12.8 oz (106.5 kg)  07/11/23 234 lb 3.2 oz (106.2 kg)  06/23/23 234 lb 3.2 oz (106.2 kg)    GEN: Well nourished, well developed in no acute distress NECK: No JVD; No carotid bruits CARDIAC: ***RRR, no murmurs, rubs, gallops RESPIRATORY:  Clear to auscultation without rales, wheezing or rhonchi  ABDOMEN: Soft, non-tender,  non-distended EXTREMITIES:  No edema; No deformity   ASSESSMENT AND PLAN Atypical chest pain Chronic HFpEF/progressive shortness of breath Status post MVR Paroxysmal atrial fibrillation status post maze Hyperlipidemia CKD stage IV    {Are you ordering a CV Procedure (e.g. stress test, cath, DCCV, TEE, etc)?   Press F2        :[id number]}  Dispo: ***  Signed, Chenise Mulvihill, NP

## 2023-09-23 ENCOUNTER — Ambulatory Visit: Attending: Cardiology | Admitting: Cardiology

## 2023-09-23 ENCOUNTER — Other Ambulatory Visit
Admission: RE | Admit: 2023-09-23 | Discharge: 2023-09-23 | Disposition: A | Discharge status: Home / Self Care | Source: Ambulatory Visit | Attending: Cardiology | Admitting: Cardiology

## 2023-09-23 ENCOUNTER — Encounter: Payer: Self-pay | Admitting: Cardiology

## 2023-09-23 ENCOUNTER — Ambulatory Visit

## 2023-09-23 VITALS — BP 134/60 | HR 65 | Ht 64.0 in | Wt 232.8 lb

## 2023-09-23 DIAGNOSIS — N184 Chronic kidney disease, stage 4 (severe): Secondary | ICD-10-CM | POA: Diagnosis not present

## 2023-09-23 DIAGNOSIS — R0609 Other forms of dyspnea: Secondary | ICD-10-CM | POA: Diagnosis not present

## 2023-09-23 DIAGNOSIS — I5032 Chronic diastolic (congestive) heart failure: Secondary | ICD-10-CM

## 2023-09-23 DIAGNOSIS — R0789 Other chest pain: Secondary | ICD-10-CM

## 2023-09-23 DIAGNOSIS — I480 Paroxysmal atrial fibrillation: Secondary | ICD-10-CM

## 2023-09-23 DIAGNOSIS — R002 Palpitations: Secondary | ICD-10-CM | POA: Insufficient documentation

## 2023-09-23 DIAGNOSIS — Z954 Presence of other heart-valve replacement: Secondary | ICD-10-CM

## 2023-09-23 DIAGNOSIS — E782 Mixed hyperlipidemia: Secondary | ICD-10-CM | POA: Diagnosis not present

## 2023-09-23 LAB — BASIC METABOLIC PANEL WITH GFR
Anion gap: 11 (ref 5–15)
BUN: 41 mg/dL — ABNORMAL HIGH (ref 6–20)
CO2: 27 mmol/L (ref 22–32)
Calcium: 9.2 mg/dL (ref 8.9–10.3)
Chloride: 96 mmol/L — ABNORMAL LOW (ref 98–111)
Creatinine, Ser: 2.5 mg/dL — ABNORMAL HIGH (ref 0.44–1.00)
GFR, Estimated: 22 mL/min — ABNORMAL LOW (ref >60)
Glucose, Bld: 106 mg/dL — ABNORMAL HIGH (ref 70–99)
Potassium: 3.9 mmol/L (ref 3.5–5.1)
Sodium: 134 mmol/L — ABNORMAL LOW (ref 135–145)

## 2023-09-23 LAB — CBC
HCT: 38.5 % (ref 36.0–46.0)
Hemoglobin: 12.3 g/dL (ref 12.0–15.0)
MCH: 28.7 pg (ref 26.0–34.0)
MCHC: 31.9 g/dL (ref 30.0–36.0)
MCV: 90 fL (ref 80.0–100.0)
Platelets: 359 K/uL (ref 150–400)
RBC: 4.28 MIL/uL (ref 3.87–5.11)
RDW: 12.8 % (ref 11.5–15.5)
WBC: 5.6 K/uL (ref 4.0–10.5)
nRBC: 0 % (ref 0.0–0.2)

## 2023-09-23 LAB — MAGNESIUM: Magnesium: 2.6 mg/dL — ABNORMAL HIGH (ref 1.7–2.4)

## 2023-09-23 LAB — BRAIN NATRIURETIC PEPTIDE: B Natriuretic Peptide: 71.9 pg/mL (ref 0.0–100.0)

## 2023-09-23 MED ORDER — AMLODIPINE BESYLATE 5 MG PO TABS: 5.0000 mg | ORAL_TABLET | Freq: Every day | ORAL | 3 refills | Status: AC

## 2023-09-23 NOTE — Progress Notes (Signed)
 Cardiology Office Note   Date:  09/23/2023  ID:  Leslie [name], DOB 28-Oct-1965, MRN [id number] PCP: Leslie [name] Leslie [name]  [facility] Providers Cardiologist:  Leslie Lunger, MD     History of Present Illness Leslie [name] is a 58 y.o. female with a past medical history of mitral valve stenosis status post MVR with concomitant Maze procedure, morbid obesity, hyperlipidemia, COPD, CKD stage IV, who presents today with complaints of shortness of breath and chest tightness.   She was hospitalized 08/2017 for chest tightness.  She was found to be in A-fib RVR and underwent TEE that showed moderate mitral stenosis and she was cardioverted.  Cardiac CTA in 2020 for chest tightness showed nonobstructive CAD.  She had progressive mitral stenosis on serial echocardiograms with severe mitral stenosis on echo in 06/2019.  Right and left heart catheterization showed moderate pulmonary hypertension with no other significant coronary artery disease.  Patient was referred to Dr. Leontine of CT surgery and she underwent mitral valve replacement with ON-X bileaflet mitral valve with maze procedure 04/2020.  She was started on Coumadin  postprocedure.  Follow-up echocardiogram 11/2020 showed an EF of 50-55% with no MV regurgitation.  Heart monitor showed normal sinus rhythm, average heart rate 77 bpm, 7 runs of SVT, lasting 6 beats with no atrial fibrillation noted.  Repeat echocardiogram in 2023 showed normal functioning valve.  She was seen 11/2022 reporting chest and back pain.  PET stress showed small perfusion defect related to apical thinning but cannot exclude mild apical ischemia.  Defect 1 was a small defect with mild reduction in uptake present in the apical anterior and lateral location that was reversible.  There was normal wall motion consistent with artifact.  And systolic cavity was normal.  Coronary calcium  present on attenuation correction CT images, mild coronary calcifications present,  mitral valve replacement noted.  Echo in 01/2023 showed LVEF 60 to 65%, no RWMA, mitral valve that was repaired/replaced with a mean gradient of 5 mmHg.  She was evaluated in 04/08/2023 and was stable from the cardiac perspective.   She was last seen in clinic 07/11/2023 and overall was doing okay for the cardiac perspective.  She recently seen pulmonary and was placed on Trelegy to help with her breathing.  At the time she denied any chest discomfort.  Breathing was little short at times and she feels occasional palpitations.  She reported muscle pain in her legs and was having trouble walking at times.  Most recent labs showed elevated serum creatinine/BUN.  She said nephrology told her to take torsemide  as needed.  Spironolactone  was decreased to 12.5 twice but was decreased at her visit.  She was also scheduled for a BMP labor visit and again in 2 weeks.  She was also advised to hold her Lipitor  continue ezetimibe  and do a 6-week statin holiday due to myalgias.  She returns to clinic today after recent call and to the triage line complaining of shortness of breath and chest pain.  She has been having progressive shortness of breath, 3 pillow orthopnea, has opted to sleep in the recliner as of late.  Has been using her inhaler more frequently.  Denies any fever but has been suffering from chills and shakiness.  She also states that prior to she was having an increased amount of palpitations.  She has been having cramps to her hands arms and legs.  She has been nausea that is different from her previous bouts of acid reflux that she  has had.  She states that she has been compliant with her current medication regimen.  Denies any hospitalizations or visits to the emergency department.  ROS: 10 point review of systems has been reviewed and considered negative except ones been listed in the HPI  Studies Reviewed EKG Interpretation Date/Time:  Friday September 23 2023 15:38:55 EDT Ventricular Rate:  65 PR  Interval:  178 QRS Duration:  80 QT Interval:  414 QTC Calculation: 430 R Axis:   42  Text Interpretation: Normal sinus rhythm Normal ECG When compared with ECG of 11-Jul-2023 13:48, No significant change was found Confirmed by Gerard Frederick ([id number]) on 09/23/2023 3:43:54 PM    Echo 01/2023 1. Left ventricular ejection fraction, by estimation, is 60 to 65%. The  left ventricle has normal function. The left ventricle has no regional  wall motion abnormalities. Left ventricular diastolic parameters are  indeterminate. The average left  ventricular global longitudinal strain is -17.3 %. The global longitudinal  strain is normal.   2. Right ventricular systolic function is normal. The right ventricular  size is normal.   3. The mitral valve has been repaired/replaced. No evidence of mitral  valve regurgitation. The mean mitral valve gradient is 5.0 mmHg. There is  a bioprosthetic valve present in the mitral position.   4. The aortic valve was not well visualized. Aortic valve regurgitation  is not visualized.   5. The inferior vena cava is normal in size with greater than 50%  respiratory variability, suggesting right atrial pressure of 3 mmHg.     Cardiac PET stress 12/2022   Findings are consistent with no infarction. The study is intermediate risk.  Likely, small perfusion defect is related to apical thinning, as stress flow is normal.  Cannot exclude mild apical ischemia.   LV perfusion is abnormal. Defect 1: There is a small defect with mild reduction in uptake present in the apical anterior and lateral location(s) that is reversible. There is normal wall motion in the defect area. Consistent with artifact.   End diastolic cavity size is normal. End systolic cavity size is normal.   Myocardial blood flow was computed to be 0.52ml/g/min at rest and 2.01ml/g/min at stress. Global myocardial blood flow reserve was 2.07 and was normal.   Coronary calcium  was present on the attenuation  correction CT images. Mild coronary calcifications were present. Mitral valve replacement noted with post procedural pericardial calcification.  Aortic atherosclerosis noted. Coronary calcifications were present in the left anterior descending artery and right coronary artery distribution(s).   Electronically Signed  By: Stanly Leavens M.D.  Risk Assessment/Calculations           Physical Exam VS:  BP 134/60 (BP Location: Left Arm, Patient Position: Sitting, Cuff Size: Normal)   Pulse 65   Ht 5' 4 (1.626 m)   Wt 232 lb 12.8 oz (105.6 kg)   SpO2 99%   BMI 39.96 kg/m        Wt Readings from Last 3 Encounters:  09/23/23 232 lb 12.8 oz (105.6 kg)  07/21/23 234 lb 12.8 oz (106.5 kg)  07/11/23 234 lb 3.2 oz (106.2 kg)    GEN: Well nourished, well developed in no acute distress NECK: No JVD; No carotid bruits CARDIAC: RRR, II/VI systolic murmur , rubs, gallops RESPIRATORY:  Clear to auscultation without rales, wheezing or rhonchi  ABDOMEN: Soft, non-tender, obese, non-distended EXTREMITIES:  No edema; No deformity   ASSESSMENT AND PLAN Atypical chest pain with associated shortness of breath, palpitations, chills, fever,  cough, nausea, cramps, orthopnea.  With a EKG today that reveals sinus rhythm with a rate of 65 with no ischemic changes noted.  Recent exposure to COVID-19 from her granddaughter at college, patient states she did a home COVID swab which was negative, likely viral infection or COVID.  Recommend repeat swab and that symptoms have progressed.  With all of her atypical symptoms she has been sent for follow-up labs today for CBC, BMP, BNP, and magnesium .  Chronic HFpEF/chronic shortness of breath recent echocardiogram completed in January 2025 revealed an LVEF of 60 to 65%, no RWMA, bioprosthetic valve present in the mitral position with a mean mitral valve gradient of 5.0 mmHg.  She is continued on GDMT of Farxiga 10 mg daily, losartan 25 mg daily, Toprol -XL 100 mg grams  daily, torsemide  20 mg twice daily, and spironolactone  12.5 mg daily.  Uptitration is limited of GDMT due to kidney function.  Status post MVR with a mean mitral valve gradient of 5.0 mmHg with a bioprosthetic valve sitting in the mitral position.  Paroxysmal atrial fibrillation status post maze where she remains in sinus rhythm today with a rate of 65.  Unfortunately she has been suffering from increased amount of palpitations.  Has not she has been continued on Toprol -XL 100 mg daily and warfarin per Coumadin  clinic.  She has also been placed on a ZIO XT monitor for 14 days to assess arrhythmia, ectopics, or pauses.  Hyperlipidemia with an LDL of 88.  She has been continued on atorvastatin  80 mg daily and ezetimibe  10 mg daily.  Recommend repeat lipid panel at return.  CKD stage IV with a serum creatinine of 2.23 stable at baseline.  Repeat BMP ordered today.  Continues to follow with nephrology.       Dispo: Patient to return to clinic see MD/APP in 6 weeks or sooner if needed for further evaluation  Signed, Valmore Arabie, NP

## 2023-09-23 NOTE — Patient Instructions (Signed)
 Medication Instructions:  Your physician recommends that you continue on your current medications as directed. Please refer to the Current Medication list given to you today.   *If you need a refill on your cardiac medications before your next appointment, please call your pharmacy*  Lab Work: Your provider would like for you to have following labs drawn today CBC, BMP, BNP, Mag.   If you have labs (blood work) drawn today and your tests are completely normal, you will receive your results only by: MyChart Message (if you have MyChart) OR A paper copy in the mail If you have any lab test that is abnormal or we need to change your treatment, we will call you to review the results.  Testing/Procedures: GEOFFRY HEWS- Long Term Monitor Instructions  Your physician has requested you wear a ZIO patch monitor for 14 days.  This is a single patch monitor. [employer] supplies one patch monitor per enrollment. Additional stickers are not available. Please do not apply patch if you will be having a Nuclear Stress Test,  Echocardiogram, Cardiac CT, MRI, or Chest Xray during the period you would be wearing the  monitor. The patch cannot be worn during these tests. You cannot remove and re-apply the  ZIO XT patch monitor.  Your ZIO patch monitor will be mailed 3 day USPS to your address on file. It may take 3-5 days  to receive your monitor after you have been enrolled.  Once you have received your monitor, please review the enclosed instructions. Your monitor  has already been registered assigning a specific monitor serial # to you.  Billing and Patient Assistance Program Information  We have supplied [employer] with any of your insurance information on file for billing purposes. [employer] offers a sliding scale Patient Assistance Program for patients that do not have  insurance, or whose insurance does not completely cover the cost of the ZIO monitor.  You must apply for the Patient Assistance Program to qualify  for this discounted rate.  To apply, please call [employer] at [phone], select option 4, select option 2, ask to apply for  Patient Assistance Program. Meredeth will ask your household income, and how many people  are in your household. They will quote your out-of-pocket cost based on that information.  [employer] will also be able to set up a 28-month, interest-free payment plan if needed.  Applying the monitor   Shave hair from upper left chest.  Hold abrader disc by orange tab. Rub abrader in 40 strokes over the upper left chest as  indicated in your monitor instructions.  Clean area with 4 enclosed alcohol pads. Let dry.  Apply patch as indicated in monitor instructions. Patch will be placed under collarbone on left  side of chest with arrow pointing upward.  Rub patch adhesive wings for 2 minutes. Remove white label marked 1. Remove the white  label marked 2. Rub patch adhesive wings for 2 additional minutes.  While looking in a mirror, press and release button in center of patch. A small green light will  flash 3-4 times. This will be your only indicator that the monitor has been turned on.  Do not shower for the first 24 hours. You may shower after the first 24 hours.  Press the button if you feel a symptom. You will hear a small click. Record Date, Time and  Symptom in the Patient Logbook.  When you are ready to remove the patch, follow instructions on the last 2 pages of Patient  Logbook. Stick patch monitor onto the last page of Patient Logbook.  Place Patient Logbook in the blue and white box. Use locking tab on box and tape box closed  securely. The blue and white box has prepaid postage on it. Please place it in the mailbox as  soon as possible. Your physician should have your test results approximately 7 days after the  monitor has been mailed back to [facility].  Call [facility] Customer Care at [phone] if you have questions regarding  your ZIO XT patch  monitor. Call them immediately if you see an orange light blinking on your  monitor.  If your monitor falls off in less than 4 days, contact our Monitor department at [phone].  If your monitor becomes loose or falls off after 4 days call [employer] at [phone] for  suggestions on securing your monitor   Follow-Up: At [facility], you and your health needs are our priority.  As part of our continuing mission to provide you with exceptional heart care, our providers are all part of one team.  This team includes your primary Cardiologist (physician) and Advanced Practice Providers or APPs (Physician Assistants and Nurse Practitioners) who all work together to provide you with the care you need, when you need it.  Your next appointment:   6 week(s)  Provider:   You may see Timothy Gollan, MD or one of the following Advanced Practice Providers on your designated Care Team:   Lonni Meager, NP Lesley Maffucci, PA-C Bernardino Bring, PA-C Cadence [place], PA-C Tylene Lunch, NP Barnie Hila, NP

## 2023-09-26 ENCOUNTER — Ambulatory Visit: Payer: Self-pay | Admitting: Cardiology

## 2023-09-26 NOTE — Progress Notes (Signed)
 Kidney function is slightly elevated from baseline.  Magnesium  is stable.  BNP is normal without concern for heart failure.  CBC is stable without concern for anemia or elevated WBCs concerning for infection.  Continue current medication regimen without changes needed at this time.

## 2023-09-27 ENCOUNTER — Ambulatory Visit: Admitting: Cardiovascular Disease

## 2023-09-27 ENCOUNTER — Encounter: Payer: Self-pay | Admitting: Pulmonary Disease

## 2023-09-27 ENCOUNTER — Ambulatory Visit: Admitting: Pulmonary Disease

## 2023-09-27 VITALS — BP 142/76 | HR 62 | Temp 97.6°F | Ht 64.0 in | Wt 235.8 lb

## 2023-09-27 DIAGNOSIS — J4540 Moderate persistent asthma, uncomplicated: Secondary | ICD-10-CM

## 2023-09-27 DIAGNOSIS — J019 Acute sinusitis, unspecified: Secondary | ICD-10-CM

## 2023-09-27 DIAGNOSIS — J45909 Unspecified asthma, uncomplicated: Secondary | ICD-10-CM | POA: Diagnosis not present

## 2023-09-27 DIAGNOSIS — Z87891 Personal history of nicotine dependence: Secondary | ICD-10-CM | POA: Diagnosis not present

## 2023-09-27 MED ORDER — DOXYCYCLINE HYCLATE 100 MG PO TABS: 100.0000 mg | ORAL_TABLET | Freq: Two times a day (BID) | ORAL | 0 refills | Status: AC

## 2023-09-27 NOTE — Progress Notes (Signed)
 Subjective:    Patient ID: Leslie [name], female    DOB: [birth year], 58 y.o.   MRN: [id number]  Patient Care Team: Kristina Tinnie [name] as PCP - General (Physician Assistant) Perla Evalene PARAS, MD as PCP - Cardiology (Cardiology) Dellie Louanne MATSU, MD (General Surgery)  Chief Complaint  Patient presents with   Asthma    Cough with clear phlegm and wheezing. Shortness of breath on exertion.     BACKGROUND/INTERVAL:58 year old former smoker with a complex medical history as noted below, who presents for follow-up of shortness of breath on exertion.  She was initially seen here on 10 May 2023.  She has been diagnosed with asthma and recurrent sinusitis.  Last visit was on 23 June 2023.   HPI Discussed the use of AI scribe software for clinical note transcription with the patient, who gave verbal consent to proceed.  History of Present Illness   Leslie [name] is a 58 year old female with asthmatic bronchitis who presents for follow-up.  She continues to use her daily inhaler, Trelegy, and has not required her emergency inhaler recently, even with the onset of cold and sinus symptoms.  She has been experiencing symptoms of congestion and sinus drainage for the past three to four days, accompanied by difficulty sleeping due to the inability to lay flat, necessitating an upright position for the past couple of weeks. She coughs up yellowish to clear sputum.  She uses Flonase  nasal spray but does not take any allergy medications. She has not yet received her flu shot but usually gets the pneumonia shot. No problems with snoring or waking up in the middle of the night.     DATA 05/10/2023 nitric oxide : 57 ppb 06/23/2023 PFTs: FEV1 1.72 L or 64% predicted, FVC 2.03 L or 59% predicted, FEV1/FVC 84%, no significant bronchodilator response.  Reduced lung volumes with ERV at 14% diffusion capacity minimally reduced corrects by alveolar volume to normal. 06/23/2023 nitric oxide : 7  ppb  Review of Systems A 10 point review of systems was performed and it is as noted above otherwise negative.   Patient Active Problem List   Diagnosis Date Noted   Hypercalcemia 06/18/2022   Hyperparathyroidism due to renal insufficiency 06/18/2022   Hyperuricemia 06/18/2022   Chronic kidney disease, stage 4 (severe) (HCC) 06/18/2022   AKI (acute kidney injury) 11/12/2021   Hypokalemia 11/11/2021   Prolonged QT interval 11/11/2021   Hyperlipidemia 11/11/2021   Melena    Bleeding 09/07/2020   Blood loss anemia 09/06/2020   S/P mitral valve replacement with Onyx bileaflet mechanical valve 04/29/2020   S/P Maze operation for atrial fibrillation 04/29/2020   S/P MVR (mitral valve replacement) 04/29/2020   BMI 39.0-39.9,adult 09/30/2019   Total knee replacement status 04/09/2019   Acute nontraumatic kidney injury 12/18/2018   Acute renal failure superimposed on stage 3b chronic kidney disease (HCC) 12/09/2018   Right knee pain 11/27/2018   GERD (gastroesophageal reflux disease) 10/25/2018   CKD (chronic kidney disease), stage III (HCC) 10/25/2018   CHF (congestive heart failure) (HCC) 10/25/2018   Normocytic anemia 10/01/2018   Iron deficiency anemia secondary to blood loss (chronic)    Polyp of descending colon    Chronic heart failure with preserved ejection fraction (HFpEF) (HCC)    Renal calculus, right 07/03/2018   Low back pain 06/27/2018   Pedal edema 06/27/2018   Abnormal renal function 02/16/2018   Acute recurrent pansinusitis 01/13/2018   Anxiety 01/13/2018   Osteoarthritis of knee 01/09/2018  Morbid obesity with body mass index (BMI) of 40.0 or higher (HCC) 11/13/2017   Major depressive disorder 09/22/2017   Mitral valve stenosis    Paroxysmal atrial fibrillation (HCC) 09/11/2017   Abdominal pain, generalized 09/08/2017   Hypertension 06/06/2017   Acute upper respiratory infection 04/28/2017   Pain of left lower extremity 02/11/2016   S/P total knee  replacement using cement 12/25/2014   Fibrocystic breast disease 03/23/2013    Social History   Tobacco Use   Smoking status: Former    Current packs/day: 0.00    Average packs/day: 0.5 packs/day for 20.0 years (10.0 ttl pk-yrs)    Types: Cigarettes    Start date: 01/1996    Quit date: 01/2016    Years since quitting: 7.7   Smokeless tobacco: Never  Substance Use Topics   Alcohol use: Not Currently    Comment: rarely    Allergies  Allergen Reactions   Morphine Hives and Rash   Oxycodone  Hcl Hives and Itching   Dilaudid  [Hydromorphone  Hcl] Itching    Current Meds  Medication Sig   albuterol  (VENTOLIN  HFA) 108 (90 Base) MCG/ACT inhaler Inhale 2 puffs into the lungs every 6 (six) hours as needed for wheezing or shortness of breath.   allopurinol (ZYLOPRIM) 100 MG tablet Take 100 mg by mouth daily.   ALPRAZolam  (XANAX ) 0.5 MG tablet TAKE 1/2 TABLET BY MOUTH AT BEDTIME AS NEEDED FOR ANXIETY   amLODipine  (NORVASC ) 5 MG tablet Take 1 tablet (5 mg total) by mouth daily.   aspirin  81 MG EC tablet Take 81 mg by mouth daily.   atorvastatin  (LIPITOR ) 80 MG tablet Take 1 tablet (80 mg total) by mouth daily.   calcium  carbonate (TUMS - DOSED IN MG ELEMENTAL CALCIUM ) 500 MG chewable tablet Chew 500 mg by mouth daily as needed for indigestion or heartburn.   dapagliflozin propanediol (FARXIGA) 10 MG TABS tablet Take 10 mg by mouth daily.   [EXPIRED] doxycycline  (VIBRA -TABS) 100 MG tablet Take 1 tablet (100 mg total) by mouth 2 (two) times daily for 7 days.   ezetimibe  (ZETIA ) 10 MG tablet Take 1 tablet (10 mg total) by mouth daily.   famciclovir  (FAMVIR ) 500 MG tablet TAKE ONE TAB BY MOUTH TWICE A DAY FOR INFECTION   fluconazole  (DIFLUCAN ) 150 MG tablet Take 1 tab po once may repeat in 3 days if symptoms persist   Fluticasone -Umeclidin-Vilant (TRELEGY ELLIPTA ) 100-62.5-25 MCG/ACT AEPB Inhale 1 puff into the lungs daily.   Fluticasone -Umeclidin-Vilant (TRELEGY ELLIPTA ) 100-62.5-25 MCG/ACT  AEPB Inhale 1 puff into the lungs daily in the afternoon.   KLOR-CON  M20 20 MEQ tablet TAKE 2 TABLETS BY MOUTH TWICE A DAY   losartan (COZAAR) 25 MG tablet Take 1 tablet by mouth daily.   metoprolol  succinate (TOPROL -XL) 100 MG 24 hr tablet Take 1 tablet (100 mg total) by mouth daily.   omeprazole  (PRILOSEC) 40 MG capsule TAKE 1 CAPSULE (40 MG TOTAL) BY MOUTH DAILY.   Semaglutide ,0.25 or 0.5MG /DOS, (OZEMPIC , 0.25 OR 0.5 MG/DOSE,) 2 MG/3ML SOPN INJECT 0.5MG  ONCE A WEEK   spironolactone  (ALDACTONE ) 25 MG tablet Take 0.5 tablets (12.5 mg total) by mouth daily.   Vitamin D , Ergocalciferol , (DRISDOL ) 1.25 MG (50000 UNIT) CAPS capsule Take 1 capsule (50,000 Units total) by mouth every 7 (seven) days.   warfarin (COUMADIN ) 2 MG tablet TAKE 1-2 TABLETS DAILY OR AS PRESCRIBED BY COUMADIN  CLINIC   [DISCONTINUED] torsemide  (DEMADEX ) 10 MG tablet Take 1 tablet (10 mg total) by mouth 2 (two) times daily.  Immunization History  Administered Date(s) Administered   Influenza,inj,Quad PF,6+ Mos 11/02/2017   PFIZER(Purple Top)SARS-COV-2 Vaccination 06/15/2019, 07/06/2019, 02/01/2020, 07/11/2020   PNEUMOCOCCAL CONJUGATE-20 11/14/2021        Objective:     BP (!) 142/76   Pulse 62   Temp 97.6 F (36.4 C) (Temporal)   Ht 5' 4 (1.626 m)   Wt 235 lb 12.8 oz (107 kg)   SpO2 98%   BMI 40.47 kg/m   SpO2: 98 %  GENERAL: Obese woman, no acute distress, ambulatory, no conversational dyspnea. HEAD: Normocephalic, atraumatic.  EYES: Pupils equal, round, reactive to light.  No scleral icterus.  MOUTH: Dentures both, oral mucosa moist.  No thrush. NECK: Supple. No thyromegaly. Trachea midline. No JVD.  No adenopathy. PULMONARY: Good air entry bilaterally.  Coarse, otherwise no adventitious sounds. CARDIOVASCULAR: S1 and S2. Regular rate and rhythm.  No rubs, murmurs or gallops heard. ABDOMEN: Obese, otherwise benign. MUSCULOSKELETAL: No joint deformity, no clubbing, no edema.  NEUROLOGIC: No overt  focal deficit, no gait disturbance, speech is fluent. SKIN: Intact,warm,dry. PSYCH: Mood and behavior normal.  Lab Results  Component Value Date   NITRICOXIDE 5 09/27/2023  *No evidence of type II inflammation *Trend: 57>>7>>5 ppb  Assessment & Plan:     ICD-10-CM   1. Moderate persistent asthma without complication  J45.40 Nitric oxide     2. Acute non-recurrent sinusitis, unspecified location  J01.90       Orders Placed This Encounter  Procedures   Nitric oxide     Meds ordered this encounter  Medications   doxycycline  (VIBRA -TABS) 100 MG tablet    Sig: Take 1 tablet (100 mg total) by mouth 2 (two) times daily for 7 days.    Dispense:  14 tablet    Refill:  0   Discussion:    Asthma Asthma is well-controlled with current inhaler therapy. No use of emergency inhaler reported. Nocturnal symptoms include difficulty lying flat, possibly due to sinus drainage. No snoring reported. Airway inflammation levels are well-maintained with Trelegy. - Continue current inhaler therapy with Trelegy.  Suspected acute sinusitis Symptoms include nasal congestion, sinus drainage, and productive cough with yellowish to clear sputum. Symptoms have been present for a few weeks. Differential includes sinusitis, which could exacerbate asthma symptoms. - Prescribe doxycycline  for suspected sinusitis. - Advise sun protection while on antibiotics to prevent sunburn.  Follow-Up Follow-up to assess asthma control and sinusitis resolution. - Schedule follow-up appointment in 4 months.     Advised if symptoms do not improve or worsen, to please contact office for sooner follow up or seek emergency care.    I spent 30 minutes of dedicated to the care of this patient on the date of this encounter to include pre-visit review of records, face-to-face time with the patient discussing conditions above, post visit ordering of testing, clinical documentation with the electronic health record, making  appropriate referrals as documented, and communicating necessary findings to members of the patients care team.   C. Leita Sanders, MD Advanced Bronchoscopy PCCM [facility]    *This note was generated using voice recognition software/Dragon and/or AI transcription program.  Despite best efforts to proofread, errors can occur which can change the meaning. Any transcriptional errors that result from this process are unintentional and may not be fully corrected at the time of dictation.

## 2023-09-27 NOTE — Patient Instructions (Signed)
 History of Present Illness Leslie [name] is a 58 year old female with asthma who presents for follow-up.  She continues to use her daily inhaler, Trelegy, and has not required her emergency inhaler recently, even with the onset of cold and sinus symptoms.  She has been experiencing symptoms of congestion and sinus drainage for the past three to four days, accompanied by difficulty sleeping due to the inability to lay flat, necessitating an upright position for the past couple of weeks. She coughs up yellowish to clear sputum.  She uses Flonase  nasal spray but does not take any allergy medications. She has not yet received her flu shot but usually gets the pneumonia shot. No problems with snoring or waking up in the middle of the night.  Assessment and Plan Asthma Asthma is well-controlled with current inhaler therapy. No use of emergency inhaler reported, even during ragweed season. Nocturnal symptoms include difficulty lying flat, possibly due to sinus drainage. No snoring reported. Airway inflammation levels are well-maintained with Trelegy. - Continue current inhaler therapy with Trelegy.  Suspected acute sinusitis Symptoms include nasal congestion, sinus drainage, and productive cough with yellowish to clear sputum. Symptoms have been present for a few weeks. Differential includes sinusitis, which could exacerbate asthma symptoms. - Prescribe antibiotic for suspected sinusitis. - Advise sun protection while on antibiotics to prevent sunburn.  Follow-Up Follow-up to assess asthma control and sinusitis resolution. - Schedule follow-up appointment in 4 months.

## 2023-09-28 DIAGNOSIS — R350 Frequency of micturition: Secondary | ICD-10-CM | POA: Diagnosis not present

## 2023-09-28 LAB — NITRIC OXIDE: Nitric Oxide: 5

## 2023-10-06 ENCOUNTER — Other Ambulatory Visit: Payer: Self-pay | Admitting: Medical

## 2023-10-06 DIAGNOSIS — Z954 Presence of other heart-valve replacement: Secondary | ICD-10-CM

## 2023-10-06 DIAGNOSIS — I050 Rheumatic mitral stenosis: Secondary | ICD-10-CM

## 2023-10-06 DIAGNOSIS — I5032 Chronic diastolic (congestive) heart failure: Secondary | ICD-10-CM

## 2023-10-11 ENCOUNTER — Ambulatory Visit: Admitting: Medical

## 2023-10-12 ENCOUNTER — Ambulatory Visit

## 2023-10-19 ENCOUNTER — Ambulatory Visit: Attending: Cardiovascular Disease

## 2023-10-19 DIAGNOSIS — I4891 Unspecified atrial fibrillation: Secondary | ICD-10-CM | POA: Diagnosis not present

## 2023-10-19 DIAGNOSIS — Z5181 Encounter for therapeutic drug level monitoring: Secondary | ICD-10-CM

## 2023-10-19 DIAGNOSIS — I050 Rheumatic mitral stenosis: Secondary | ICD-10-CM | POA: Diagnosis not present

## 2023-10-19 LAB — POCT INR: INR: 1.8 — AB (ref 2.0–3.0)

## 2023-10-19 NOTE — Patient Instructions (Signed)
 Take 1.5 tablets today only then Increase to 1 tablet daily except 1/2 tablet on Fridays. Stay consistent with greens.  - Recheck INR in 3 weeks [phone]

## 2023-10-20 ENCOUNTER — Other Ambulatory Visit: Payer: Self-pay | Admitting: Physician Assistant

## 2023-10-22 ENCOUNTER — Other Ambulatory Visit: Payer: Self-pay | Admitting: Physician Assistant

## 2023-11-02 ENCOUNTER — Other Ambulatory Visit
Admission: RE | Admit: 2023-11-02 | Discharge: 2023-11-02 | Disposition: A | Discharge status: Home / Self Care | Attending: Nephrology | Admitting: Nephrology

## 2023-11-02 DIAGNOSIS — I129 Hypertensive chronic kidney disease with stage 1 through stage 4 chronic kidney disease, or unspecified chronic kidney disease: Secondary | ICD-10-CM | POA: Insufficient documentation

## 2023-11-02 DIAGNOSIS — E876 Hypokalemia: Secondary | ICD-10-CM | POA: Insufficient documentation

## 2023-11-02 DIAGNOSIS — R600 Localized edema: Secondary | ICD-10-CM | POA: Insufficient documentation

## 2023-11-02 DIAGNOSIS — E790 Hyperuricemia without signs of inflammatory arthritis and tophaceous disease: Secondary | ICD-10-CM | POA: Insufficient documentation

## 2023-11-02 DIAGNOSIS — N2581 Secondary hyperparathyroidism of renal origin: Secondary | ICD-10-CM | POA: Diagnosis not present

## 2023-11-02 DIAGNOSIS — N1832 Chronic kidney disease, stage 3b: Secondary | ICD-10-CM | POA: Insufficient documentation

## 2023-11-02 LAB — CBC WITH DIFFERENTIAL/PLATELET
Abs Immature Granulocytes: 0.02 K/uL (ref 0.00–0.07)
Basophils Absolute: 0 K/uL (ref 0.0–0.1)
Basophils Relative: 1 %
Eosinophils Absolute: 0.1 K/uL (ref 0.0–0.5)
Eosinophils Relative: 2 %
HCT: 36.5 % (ref 36.0–46.0)
Hemoglobin: 11.8 g/dL — ABNORMAL LOW (ref 12.0–15.0)
Immature Granulocytes: 0 %
Lymphocytes Relative: 31 %
Lymphs Abs: 1.8 K/uL (ref 0.7–4.0)
MCH: 28.7 pg (ref 26.0–34.0)
MCHC: 32.3 g/dL (ref 30.0–36.0)
MCV: 88.8 fL (ref 80.0–100.0)
Monocytes Absolute: 0.6 K/uL (ref 0.1–1.0)
Monocytes Relative: 10 %
Neutro Abs: 3.2 K/uL (ref 1.7–7.7)
Neutrophils Relative %: 56 %
Platelets: 330 K/uL (ref 150–400)
RBC: 4.11 MIL/uL (ref 3.87–5.11)
RDW: 13 % (ref 11.5–15.5)
WBC: 5.7 K/uL (ref 4.0–10.5)
nRBC: 0 % (ref 0.0–0.2)

## 2023-11-02 LAB — RENAL FUNCTION PANEL
Albumin: 4.1 g/dL (ref 3.5–5.0)
Anion gap: 11 (ref 5–15)
BUN: 34 mg/dL — ABNORMAL HIGH (ref 6–20)
CO2: 25 mmol/L (ref 22–32)
Calcium: 9.2 mg/dL (ref 8.9–10.3)
Chloride: 103 mmol/L (ref 98–111)
Creatinine, Ser: 2.21 mg/dL — ABNORMAL HIGH (ref 0.44–1.00)
GFR, Estimated: 25 mL/min — ABNORMAL LOW (ref >60)
Glucose, Bld: 110 mg/dL — ABNORMAL HIGH (ref 70–99)
Phosphorus: 3.6 mg/dL (ref 2.5–4.6)
Potassium: 4 mmol/L (ref 3.5–5.1)
Sodium: 139 mmol/L (ref 135–145)

## 2023-11-02 LAB — URINALYSIS, ROUTINE W REFLEX MICROSCOPIC
Bacteria, UA: NONE SEEN
Bilirubin Urine: NEGATIVE
Glucose, UA: >=500 mg/dL — AB
Hgb urine dipstick: NEGATIVE
Ketones, ur: NEGATIVE mg/dL
Leukocytes,Ua: NEGATIVE
Nitrite: NEGATIVE
Protein, ur: NEGATIVE mg/dL
Specific Gravity, Urine: 1.005 (ref 1.005–1.030)
pH: 6 (ref 5.0–8.0)

## 2023-11-02 LAB — PROTEIN / CREATININE RATIO, URINE
Creatinine, Urine: 57 mg/dL
Total Protein, Urine: <6 mg/dL

## 2023-11-03 LAB — PARATHYROID HORMONE, INTACT (NO CA): PTH: 98 pg/mL — ABNORMAL HIGH (ref 15–65)

## 2023-11-07 ENCOUNTER — Ambulatory Visit: Attending: Cardiology

## 2023-11-07 ENCOUNTER — Ambulatory Visit: Admitting: Cardiology

## 2023-11-07 DIAGNOSIS — N184 Chronic kidney disease, stage 4 (severe): Secondary | ICD-10-CM | POA: Diagnosis not present

## 2023-11-07 DIAGNOSIS — E876 Hypokalemia: Secondary | ICD-10-CM | POA: Diagnosis not present

## 2023-11-07 DIAGNOSIS — N2581 Secondary hyperparathyroidism of renal origin: Secondary | ICD-10-CM | POA: Diagnosis not present

## 2023-11-07 DIAGNOSIS — I10 Essential (primary) hypertension: Secondary | ICD-10-CM | POA: Diagnosis not present

## 2023-11-07 DIAGNOSIS — R002 Palpitations: Secondary | ICD-10-CM

## 2023-11-07 DIAGNOSIS — I129 Hypertensive chronic kidney disease with stage 1 through stage 4 chronic kidney disease, or unspecified chronic kidney disease: Secondary | ICD-10-CM | POA: Diagnosis not present

## 2023-11-07 DIAGNOSIS — R600 Localized edema: Secondary | ICD-10-CM | POA: Diagnosis not present

## 2023-11-07 NOTE — Addendum Note (Signed)
 Addended by: Deriana Vanderhoef D on: 11/07/2023 11:54 AM   Modules accepted: Orders

## 2023-11-09 ENCOUNTER — Ambulatory Visit: Attending: Cardiovascular Disease

## 2023-11-09 DIAGNOSIS — Z5181 Encounter for therapeutic drug level monitoring: Secondary | ICD-10-CM

## 2023-11-09 DIAGNOSIS — I050 Rheumatic mitral stenosis: Secondary | ICD-10-CM | POA: Diagnosis not present

## 2023-11-09 DIAGNOSIS — I4891 Unspecified atrial fibrillation: Secondary | ICD-10-CM

## 2023-11-09 LAB — POCT INR: INR: 2.3 (ref 2.0–3.0)

## 2023-11-09 NOTE — Patient Instructions (Signed)
 Take 2 tablets today only then Continue 1 tablet daily except 1/2 tablet on Fridays. Stay consistent with greens.  - Recheck INR in 6 weeks [phone]

## 2023-11-10 ENCOUNTER — Other Ambulatory Visit: Payer: Self-pay | Admitting: Cardiovascular Disease

## 2023-11-19 ENCOUNTER — Other Ambulatory Visit: Payer: Self-pay | Admitting: Physician Assistant

## 2023-11-19 DIAGNOSIS — F419 Anxiety disorder, unspecified: Secondary | ICD-10-CM

## 2023-11-20 ENCOUNTER — Other Ambulatory Visit: Payer: Self-pay

## 2023-11-20 ENCOUNTER — Emergency Department (HOSPITAL_COMMUNITY)
Admission: EM | Admit: 2023-11-20 | Discharge: 2023-11-20 | Disposition: A | Discharge status: Home / Self Care | Attending: Emergency Medicine | Admitting: Emergency Medicine

## 2023-11-20 ENCOUNTER — Encounter (HOSPITAL_COMMUNITY): Payer: Self-pay

## 2023-11-20 ENCOUNTER — Emergency Department (HOSPITAL_COMMUNITY)

## 2023-11-20 DIAGNOSIS — I5030 Unspecified diastolic (congestive) heart failure: Secondary | ICD-10-CM | POA: Insufficient documentation

## 2023-11-20 DIAGNOSIS — R0789 Other chest pain: Secondary | ICD-10-CM | POA: Diagnosis present

## 2023-11-20 DIAGNOSIS — R002 Palpitations: Secondary | ICD-10-CM | POA: Diagnosis not present

## 2023-11-20 DIAGNOSIS — I110 Hypertensive heart disease with heart failure: Secondary | ICD-10-CM | POA: Diagnosis not present

## 2023-11-20 DIAGNOSIS — Z7982 Long term (current) use of aspirin: Secondary | ICD-10-CM | POA: Insufficient documentation

## 2023-11-20 DIAGNOSIS — Z79899 Other long term (current) drug therapy: Secondary | ICD-10-CM | POA: Diagnosis not present

## 2023-11-20 DIAGNOSIS — J45909 Unspecified asthma, uncomplicated: Secondary | ICD-10-CM | POA: Diagnosis not present

## 2023-11-20 DIAGNOSIS — R079 Chest pain, unspecified: Secondary | ICD-10-CM

## 2023-11-20 DIAGNOSIS — I2510 Atherosclerotic heart disease of native coronary artery without angina pectoris: Secondary | ICD-10-CM | POA: Diagnosis not present

## 2023-11-20 LAB — CBC
HCT: 40.6 % (ref 36.0–46.0)
Hemoglobin: 12.9 g/dL (ref 12.0–15.0)
MCH: 28.3 pg (ref 26.0–34.0)
MCHC: 31.8 g/dL (ref 30.0–36.0)
MCV: 89 fL (ref 80.0–100.0)
Platelets: 375 K/uL (ref 150–400)
RBC: 4.56 MIL/uL (ref 3.87–5.11)
RDW: 12.9 % (ref 11.5–15.5)
WBC: 6.5 K/uL (ref 4.0–10.5)
nRBC: 0 % (ref 0.0–0.2)

## 2023-11-20 LAB — PROTIME-INR
INR: 2.3 — ABNORMAL HIGH (ref 0.8–1.2)
Prothrombin Time: 26.7 s — ABNORMAL HIGH (ref 11.4–15.2)

## 2023-11-20 LAB — BASIC METABOLIC PANEL WITH GFR
Anion gap: 13 (ref 5–15)
BUN: 27 mg/dL — ABNORMAL HIGH (ref 6–20)
CO2: 26 mmol/L (ref 22–32)
Calcium: 9.2 mg/dL (ref 8.9–10.3)
Chloride: 96 mmol/L — ABNORMAL LOW (ref 98–111)
Creatinine, Ser: 2.29 mg/dL — ABNORMAL HIGH (ref 0.44–1.00)
GFR, Estimated: 24 mL/min — ABNORMAL LOW (ref >60)
Glucose, Bld: 102 mg/dL — ABNORMAL HIGH (ref 70–99)
Potassium: 3.7 mmol/L (ref 3.5–5.1)
Sodium: 135 mmol/L (ref 135–145)

## 2023-11-20 LAB — MAGNESIUM: Magnesium: 2.2 mg/dL (ref 1.7–2.4)

## 2023-11-20 LAB — TROPONIN I (HIGH SENSITIVITY)
Troponin I (High Sensitivity): 7 ng/L (ref <18)
Troponin I (High Sensitivity): 7 ng/L (ref <18)

## 2023-11-20 NOTE — ED Notes (Signed)
 PT stated that she felt like her heart rate was racing while she was driving. She pulled over and called EMS. She received ASA and nitroglycerin  enroute. PT has a Hx of MI and had mitral valve surgery. Currently she is wearing a ZIO monitor x14 days. She self reports that she did not press her button due to fear while driving so she was not injured.   PT did take her medication today. PT reports that she was vomiting and diarrhea yesterday with a small amount of both.   12-lead was handed to physician.

## 2023-11-20 NOTE — Discharge Instructions (Addendum)
 Yesterday your workup was reassuring here.  Follow-up with your cardiologist for the cardiac monitor that you have on.

## 2023-11-20 NOTE — ED Triage Notes (Signed)
 Pt presents to ED via [facility] from parking lot. Pt was driving to dentist and had chest pain. She pulled over and took 324 mg of asa and continued to drive. Pt then started having chest pressure so she pulled over and called 911. Pain radiates to right shoulder and down epigastric region. Pt has hx of MI.  650mg  asa total nitroglycerin  x2 Spo2 99% Hr84 Bp 147/68

## 2023-11-20 NOTE — ED Provider Notes (Signed)
 [place] EMERGENCY DEPARTMENT AT [facility] Provider Note   CSN: [id number] Arrival date & time: 11/20/23  1045     Patient presents with: Chest Pain   Leslie [name] is a 58 y.o. female.    Chest Pain Patient with chest pain and palpitations.  History of reported coronary artery disease, mitral valve replacement  Also had previous A-fib but had maze procedure.  He is on anticoagulation.  Has been seeing cardiology currently wearing a monitor.  Had chest pain and palpitations while coming from the dentist.      Past Medical History:  Diagnosis Date   (HFpEF) heart failure with preserved ejection fraction (HCC)    a. 08/2017 Echo: EF 55-60%.  Grade 2 diastolic dysfunction; b. 05/2020 Echo: EF 50-55%, no rwma, Nl RV fxn, nl fxn'ing mech MV.   Allergy    Anemia    Anxiety    Asthma    BRCA negative 03/22/2013   Bronchitis 02/19/2016   ON LEVAQUIN  PO   Chest tightness    a. 08/2019 Cath: nl cors.   Cigarette smoker 09/11/2017   8-10 day   Dysrhythmia    GERD (gastroesophageal reflux disease)    History of kidney stones    Hyperlipidemia    Hypertension    Interstitial lung disease (HCC)    a. CT 2013 b. 02/2018 CXR noted recurrent intersistial changes ILD vs chronic bronchitis   Moderate mitral stenosis    a.  08/2017 TEE: EF 60 to 65%.  Moderate mitral stenosis.  Mean gradient 14 mmHg.  Valve area 2.59 cm by planimetry, 2.72 cm by pressure half-time.   PAF (paroxysmal atrial fibrillation) (HCC)    a. 08/2017 s/p TEE/DCCV; b. CHA2DS2VASc = 2-->warfarin.   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 04/2020 s/p 27/29 mm Onyx mech mitral valve-->chronic coumadin ; b. 05/2020 Echo: Nl fxn'ing mech MV.    Prior to Admission medications    Medication Sig Start Date End Date Taking? Authorizing Provider  albuterol  (VENTOLIN  HFA) 108 (90 Base) MCG/ACT inhaler Inhale 2 puffs into the lungs every 6 (six) hours as needed for wheezing or shortness of breath.    [provider]  allopurinol (ZYLOPRIM) 100 MG tablet Take 100 mg by mouth daily.    [provider]  ALPRAZolam  (XANAX ) 0.5 MG tablet TAKE 1/2 TABLET BY MOUTH AT BEDTIME AS NEEDED FOR ANXIETY 09/20/23   McDonough, Lauren K, PA-C  amLODipine  (NORVASC ) 5 MG tablet Take 1 tablet (5 mg total) by mouth daily. 09/23/23 09/22/24  Gerard Frederick, NP  aspirin  81 MG EC tablet Take 81 mg by mouth daily.    [provider]  atorvastatin  (LIPITOR ) 80 MG tablet Take 1 tablet (80 mg total) by mouth daily. 10/05/22   Gollan, Timothy J, MD  calcium  carbonate (TUMS - DOSED IN MG ELEMENTAL CALCIUM ) 500 MG chewable tablet Chew 500 mg by mouth daily as needed for indigestion or heartburn.    [provider]  dapagliflozin propanediol (FARXIGA) 10 MG TABS tablet Take 10 mg by mouth daily.    [provider]  ezetimibe  (ZETIA ) 10 MG tablet Take 1 tablet (10 mg total) by mouth daily. 06/15/23   McDonough, Lauren K, PA-C  famciclovir  (FAMVIR ) 500 MG tablet TAKE ONE TAB BY MOUTH  TWICE A DAY FOR INFECTION 03/04/23   Khan, Fozia M, MD  fluconazole  (DIFLUCAN ) 150 MG tablet Take 1 tab po once may repeat in 3 days if symptoms persist 07/21/23   McDonough, Tinnie POUR, PA-C  Fluticasone -Umeclidin-Vilant (TRELEGY ELLIPTA ) 100-62.5-25 MCG/ACT AEPB Inhale 1 puff into the lungs daily. 06/23/23   Tamea Dedra CROME, MD  Fluticasone -Umeclidin-Vilant (TRELEGY ELLIPTA ) 100-62.5-25 MCG/ACT AEPB Inhale 1 puff into the lungs daily in the afternoon. 06/23/23   Tamea Dedra CROME, MD  KLOR-CON  M20 20 MEQ tablet TAKE 2 TABLETS BY MOUTH TWICE A DAY 04/25/23   McDonough, Lauren K, PA-C  losartan (COZAAR) 25 MG tablet Take 1 tablet by mouth daily. 04/18/23   [provider]  metoprolol  succinate  (TOPROL -XL) 100 MG 24 hr tablet Take 1 tablet (100 mg total) by mouth daily. 04/29/23   Gollan, Timothy J, MD  omeprazole  (PRILOSEC) 40 MG capsule TAKE 1 CAPSULE (40 MG TOTAL) BY MOUTH DAILY. 10/20/23   McDonough, Tinnie POUR, PA-C  Semaglutide ,0.25 or 0.5MG /DOS, (OZEMPIC , 0.25 OR 0.5 MG/DOSE,) 2 MG/3ML SOPN INJECT 0.5MG  ONCE A WEEK 10/24/23   McDonough, Lauren K, PA-C  spironolactone  (ALDACTONE ) 25 MG tablet TAKE 1 TABLET (25 MG TOTAL) BY MOUTH DAILY. 11/11/23   Furth, Cadence H, PA-C  torsemide  (DEMADEX ) 10 MG tablet TAKE 1 TABLET BY MOUTH TWICE A DAY 10/06/23   Furth, Cadence H, PA-C  Vitamin D , Ergocalciferol , (DRISDOL ) 1.25 MG (50000 UNIT) CAPS capsule Take 1 capsule (50,000 Units total) by mouth every 7 (seven) days. 05/11/23   McDonough, Lauren K, PA-C  warfarin (COUMADIN ) 2 MG tablet TAKE 1-2 TABLETS DAILY OR AS PRESCRIBED BY COUMADIN  CLINIC 08/29/23   Gollan, Timothy J, MD    Allergies: Morphine, Oxycodone  hcl, and Dilaudid  [hydromorphone  hcl]    Review of Systems  Cardiovascular:  Positive for chest pain.    Updated Vital Signs BP 114/63 (BP Location: Right Arm)   Pulse 63   Temp 98.1 F (36.7 C)   Resp 14   Ht 5' 4 (1.626 m)   Wt 104.3 kg   SpO2 99%   BMI 39.48 kg/m   Physical Exam Vitals and nursing note reviewed.  Cardiovascular:     Rate and Rhythm: Regular rhythm.  Pulmonary:     Breath sounds: No decreased breath sounds or wheezing.  Abdominal:     Tenderness: There is no abdominal tenderness.  Musculoskeletal:     Right lower leg: No edema.     Left lower leg: No edema.  Neurological:     Mental Status: She is alert.   About or anyone distant to the abdomen Yeah oh I did not sign it would get  (all labs ordered are listed, but only abnormal results are displayed) Labs Reviewed  BASIC METABOLIC PANEL WITH GFR - Abnormal; Notable for the following components:      Result Value   Chloride 96 (*)    Glucose, Bld 102 (*)    BUN 27 (*)    Creatinine, Ser 2.29 (*)     GFR, Estimated 24 (*)    All other components within normal limits  PROTIME-INR - Abnormal; Notable for the following components:   Prothrombin Time 26.7 (*)    INR 2.3 (*)    All other components within normal limits  CBC  MAGNESIUM   TROPONIN I (HIGH SENSITIVITY)  TROPONIN I (HIGH SENSITIVITY)    EKG: EKG Interpretation Date/Time:  Sunday November 20 2023 12:02:51 EST Ventricular Rate:  61 PR Interval:  168 QRS  Duration:  99 QT Interval:  438 QTC Calculation: 442 R Axis:   57  Text Interpretation: Sinus rhythm RSR' in V1 or V2, probably normal variant Confirmed by Patsey Lot [phone]) on 11/20/2023 12:39:03 PM  Radiology: DG Chest Portable 1 View Result Date: 11/20/2023 CLINICAL DATA:  Chest pain. EXAM: PORTABLE CHEST 1 VIEW COMPARISON:  11/22/2022 FINDINGS: The heart size and mediastinal contours are within normal limits. Prosthetic mitral valve and left atrial appendage clip again seen. Both lungs are clear. The visualized skeletal structures are unremarkable. IMPRESSION: No active disease. Electronically Signed   By: Norleen DELENA Kil M.D.   On: 11/20/2023 12:52     Procedures   Medications Ordered in the ED - No data to display                                  Medical Decision Making Amount and/or Complexity of Data Reviewed Labs: ordered. Radiology: ordered.   Patient palpitation and chest pain.  Currently wearing monitor for potential A-fib.  Differential diagnoses include arrhythmia and cardiac disease.  However EKG is reassuring.  Sinus rhythm.  Troponin negative x 2.  Chest x-ray reassuring.  No abnormalities on monitor while here.  Does have small ulcer in mouth.  Doubt this is a cause of the arrhythmia potentially to some pain in the neck area.  Appears safe with discharge home with outpatient follow-up.     Final diagnoses:  Nonspecific chest pain  Palpitations    ED Discharge Orders          Ordered    Ambulatory referral to Cardiology        Comments: If you have not heard from the Cardiology office within the next 72 hours please call [phone].   11/20/23 1514               Patsey Lot, MD 11/20/23 1534

## 2023-11-21 ENCOUNTER — Encounter: Payer: Self-pay | Admitting: Physician Assistant

## 2023-11-21 ENCOUNTER — Ambulatory Visit (INDEPENDENT_AMBULATORY_CARE_PROVIDER_SITE_OTHER): Payer: Medicare Other | Admitting: Physician Assistant

## 2023-11-21 VITALS — BP 115/60 | HR 73 | Temp 98.0°F | Resp 16 | Ht 64.0 in | Wt 232.0 lb

## 2023-11-21 DIAGNOSIS — R002 Palpitations: Secondary | ICD-10-CM

## 2023-11-21 DIAGNOSIS — R300 Dysuria: Secondary | ICD-10-CM

## 2023-11-21 DIAGNOSIS — N390 Urinary tract infection, site not specified: Secondary | ICD-10-CM | POA: Diagnosis not present

## 2023-11-21 DIAGNOSIS — I480 Paroxysmal atrial fibrillation: Secondary | ICD-10-CM | POA: Diagnosis not present

## 2023-11-21 LAB — POCT URINALYSIS DIPSTICK
Bilirubin, UA: NEGATIVE
Blood, UA: NEGATIVE
Glucose, UA: NEGATIVE
Ketones, UA: NEGATIVE
Leukocytes, UA: NEGATIVE
Nitrite, UA: NEGATIVE
Protein, UA: NEGATIVE
Spec Grav, UA: 1.01 (ref 1.010–1.025)
Urobilinogen, UA: 0.2 U/dL
pH, UA: 5 (ref 5.0–8.0)

## 2023-11-21 NOTE — Progress Notes (Signed)
 [facility] [address] [place], [place] [address]  Internal MEDICINE  Office Visit Note  Patient Name: Leslie [name]  [id number]  [id number]  Date of Service: 11/21/2023  Chief Complaint  Patient presents with   Follow-up    ED F/U for chest pain   Quality Metric Gaps    Needs AWV   Urinary Tract Infection    HPI Pt is here for ED follow up -Started having heart racing and SOB yesterday morning while driving. Pulled over and drank water and took deep breaths. Got back on the round to [employer] to get her granddaughter, but it got worse and pulled over and called 911. Told to Chew ASA by EMT. Was taken to ED and given 2 rounds of nitroglycerin  in ambulance after right arm started hurting.  -Chest xray, troponins and EKG and was released home -Will be following up with cardiology, has zio on already to re-evaluate for afib reoccurrence  -feels much better today, no more palpitations. Slept well -a little low back pain and wonders about UTI, will check  Current Medication: Outpatient Encounter Medications as of 11/21/2023  Medication Sig   albuterol  (VENTOLIN  HFA) 108 (90 Base) MCG/ACT inhaler Inhale 2 puffs into the lungs every 6 (six) hours as needed for wheezing or shortness of breath.   allopurinol (ZYLOPRIM) 100 MG tablet Take 100 mg by mouth daily.   amLODipine  (NORVASC ) 5 MG tablet Take 1 tablet (5 mg total) by mouth daily.   aspirin  81 MG EC tablet Take 81 mg by mouth daily.   atorvastatin  (LIPITOR ) 80 MG tablet Take 1 tablet (80 mg total) by mouth daily.   calcium  carbonate (TUMS - DOSED IN MG ELEMENTAL CALCIUM ) 500 MG chewable tablet Chew 500 mg by mouth daily as needed for indigestion or heartburn.   dapagliflozin propanediol (FARXIGA) 10 MG TABS tablet Take 10 mg by mouth daily.   ezetimibe  (ZETIA ) 10 MG tablet Take 1 tablet (10 mg total) by mouth daily.   famciclovir  (FAMVIR ) 500 MG tablet TAKE ONE TAB BY MOUTH TWICE A DAY FOR INFECTION    Fluticasone -Umeclidin-Vilant (TRELEGY ELLIPTA ) 100-62.5-25 MCG/ACT AEPB Inhale 1 puff into the lungs daily.   KLOR-CON  M20 20 MEQ tablet TAKE 2 TABLETS BY MOUTH TWICE A DAY   losartan (COZAAR) 25 MG tablet Take 1 tablet by mouth daily.   metoprolol  succinate (TOPROL -XL) 100 MG 24 hr tablet Take 1 tablet (100 mg total) by mouth daily.   omeprazole  (PRILOSEC) 40 MG capsule TAKE 1 CAPSULE (40 MG TOTAL) BY MOUTH DAILY.   Semaglutide ,0.25 or 0.5MG /DOS, (OZEMPIC , 0.25 OR 0.5 MG/DOSE,) 2 MG/3ML SOPN INJECT 0.5MG  ONCE A WEEK   spironolactone  (ALDACTONE ) 25 MG tablet TAKE 1 TABLET (25 MG TOTAL) BY MOUTH DAILY.   torsemide  (DEMADEX ) 10 MG tablet TAKE 1 TABLET BY MOUTH TWICE A DAY   warfarin (COUMADIN ) 2 MG tablet TAKE 1-2 TABLETS DAILY OR AS PRESCRIBED BY COUMADIN  CLINIC   [DISCONTINUED] ALPRAZolam  (XANAX ) 0.5 MG tablet TAKE 1/2 TABLET BY MOUTH AT BEDTIME AS NEEDED FOR ANXIETY   [DISCONTINUED] fluconazole  (DIFLUCAN ) 150 MG tablet Take 1 tab po once may repeat in 3 days if symptoms persist   [DISCONTINUED] Fluticasone -Umeclidin-Vilant (TRELEGY ELLIPTA ) 100-62.5-25 MCG/ACT AEPB Inhale 1 puff into the lungs daily in the afternoon.   [DISCONTINUED] Vitamin D , Ergocalciferol , (DRISDOL ) 1.25 MG (50000 UNIT) CAPS capsule Take 1 capsule (50,000 Units total) by mouth every 7 (seven) days.   No facility-administered encounter medications on file as of 11/21/2023.  Surgical History: Past Surgical History:  Procedure Laterality Date   ABDOMINAL HYSTERECTOMY     total   ABDOMINAL HYSTERECTOMY     BREAST BIOPSY Bilateral 2012   BREAST BIOPSY Right 08-07-12   fibroadenomatous changes and columnar cells   BREAST BIOPSY  02/03/2015   stereo byrnett   BUBBLE STUDY  04/01/2020   Procedure: BUBBLE STUDY;  Surgeon: Loni Soyla LABOR, MD;  Location: [facility];  Service: Cardiovascular;;   CARDIAC CATHETERIZATION     CHOLECYSTECTOMY N/A 02/27/2016   Procedure: LAPAROSCOPIC CHOLECYSTECTOMY;  Surgeon: Louanne KANDICE Muse, MD;  Location: [facility] ORS;  Service: General;  Laterality: N/A;   CLIPPING OF ATRIAL APPENDAGE  04/29/2020   Procedure: CLIPPING OF ATRIAL APPENDAGE USING ATRICURE  PRO2 CLIP SIZE ;  Surgeon: Dusty Sudie DEL, MD;  Location: [facility] OR;  Service: Open Heart Surgery;;   COLONOSCOPY WITH PROPOFOL  N/A 09/26/2018   Procedure: COLONOSCOPY WITH PROPOFOL ;  Surgeon: Jinny Carmine, MD;  Location: [facility] ENDOSCOPY;  Service: Endoscopy;  Laterality: N/A;   COLONOSCOPY WITH PROPOFOL  N/A 07/16/2021   Procedure: COLONOSCOPY WITH PROPOFOL ;  Surgeon: Jinny Carmine, MD;  Location: [facility] ENDOSCOPY;  Service: Endoscopy;  Laterality: N/A;   CYSTOSCOPY W/ RETROGRADES Right 08/18/2018   Procedure: CYSTOSCOPY WITH RETROGRADE PYELOGRAM;  Surgeon: Francisca Redell BROCKS, MD;  Location: [facility] ORS;  Service: Urology;  Laterality: Right;   CYSTOSCOPY/URETEROSCOPY/HOLMIUM LASER/STENT PLACEMENT Right 08/18/2018   Procedure: CYSTOSCOPY/URETEROSCOPY/STENT PLACEMENT;  Surgeon: Francisca Redell BROCKS, MD;  Location: [facility] ORS;  Service: Urology;  Laterality: Right;   DIAGNOSTIC LAPAROSCOPY     ESOPHAGOGASTRODUODENOSCOPY N/A 07/16/2021   Procedure: ESOPHAGOGASTRODUODENOSCOPY (EGD);  Surgeon: Jinny Carmine, MD;  Location[facility] ENDOSCOPY;  Service: Endoscopy;  Laterality: N/A;   ESOPHAGOGASTRODUODENOSCOPY (EGD) WITH PROPOFOL  N/A 09/26/2018   Procedure: ESOPHAGOGASTRODUODENOSCOPY (EGD) WITH PROPOFOL ;  Surgeon: Jinny Carmine, MD;  Location: [facility] ENDOSCOPY;  Service: Endoscopy;  Laterality: N/A;   GIVENS CAPSULE STUDY N/A 11/03/2018   Procedure: GIVENS CAPSULE STUDY;  Surgeon: Jinny Carmine, MD;  Location: [facility] ENDOSCOPY;  Service: Endoscopy;  Laterality: N/A;   JOINT REPLACEMENT Left    knee   KNEE ARTHROPLASTY Right 04/09/2019   Procedure: COMPUTER ASSISTED TOTAL KNEE ARTHROPLASTY;  Surgeon: Mardee Lynwood SQUIBB, MD;  Location: [facility] ORS;  Service: Orthopedics;  Laterality: Right;   KNEE CLOSED REDUCTION Left 04/15/2015   Procedure: CLOSED MANIPULATION KNEE;   Surgeon: Franky Cranker, MD;  Location: [facility] ORS;  Service: Orthopedics;  Laterality: Left;   KNEE SURGERY     MAZE N/A 04/29/2020   Procedure: MAZE;  Surgeon: Dusty Sudie DEL, MD;  Location: [facility] OR;  Service: Open Heart Surgery;  Laterality: N/A;   MITRAL VALVE REPLACEMENT N/A 04/29/2020   Procedure: MITRAL VALVE (MV) REPLACEMENT USING ON-X VALVE SIZE 27/29MM;  Surgeon: Dusty Sudie DEL, MD;  Location: [facility] OR;  Service: Open Heart Surgery;  Laterality: N/A;   MULTIPLE EXTRACTIONS WITH ALVEOLOPLASTY N/A 02/28/2020   Procedure: MULTIPLE EXTRACTION WITH ALVEOLOPLASTY;  Surgeon: Celena Lum NOVAK, DMD;  Location: [facility] OR;  Service: Dentistry;  Laterality: N/A;   RIGHT/LEFT HEART CATH AND CORONARY ANGIOGRAPHY Bilateral 09/06/2019   Procedure: RIGHT/LEFT HEART CATH AND CORONARY ANGIOGRAPHY;  Surgeon: Perla Evalene PARAS, MD;  Location: [facility] INVASIVE CV LAB;  Service: Cardiovascular;  Laterality: Bilateral;   TEE WITHOUT CARDIOVERSION N/A 09/13/2017   Procedure: TRANSESOPHAGEAL ECHOCARDIOGRAM (TEE);  Surgeon: Mady Bruckner, MD;  Location: [facility] ORS;  Service: Cardiovascular;  Laterality: N/A;   TEE WITHOUT CARDIOVERSION N/A 04/01/2020   Procedure: TRANSESOPHAGEAL ECHOCARDIOGRAM (TEE);  Surgeon: Acharya,  Soyla LABOR, MD;  Location: [facility] ENDOSCOPY;  Service: Cardiovascular;  Laterality: N/A;   TEE WITHOUT CARDIOVERSION N/A 04/29/2020   Procedure: TRANSESOPHAGEAL ECHOCARDIOGRAM (TEE);  Surgeon: Dusty Sudie DEL, MD;  Location: [facility] OR;  Service: Open Heart Surgery;  Laterality: N/A;   TOTAL KNEE ARTHROPLASTY Left 12/25/2014   Procedure: TOTAL KNEE ARTHROPLASTY;  Surgeon: Franky Cranker, MD;  Location: [facility] ORS;  Service: Orthopedics;  Laterality: Left;   TUBAL LIGATION      Medical History: Past Medical History:  Diagnosis Date   (HFpEF) heart failure with preserved ejection fraction (HCC)    a. 08/2017 Echo: EF 55-60%.  Grade 2 diastolic dysfunction; b. 05/2020 Echo: EF 50-55%, no rwma, Nl RV fxn, nl fxn'ing mech  MV.   Allergy    Anemia    Anxiety    Asthma    BRCA negative 03/22/2013   Bronchitis 02/19/2016   ON LEVAQUIN  PO   Chest tightness    a. 08/2019 Cath: nl cors.   Cigarette smoker 09/11/2017   8-10 day   Dysrhythmia    GERD (gastroesophageal reflux disease)    History of kidney stones    Hyperlipidemia    Hypertension    Interstitial lung disease (HCC)    a. CT 2013 b. 02/2018 CXR noted recurrent intersistial changes ILD vs chronic bronchitis   Moderate mitral stenosis    a.  08/2017 TEE: EF 60 to 65%.  Moderate mitral stenosis.  Mean gradient 14 mmHg.  Valve area 2.59 cm by planimetry, 2.72 cm by pressure half-time.   PAF (paroxysmal atrial fibrillation) (HCC)    a. 08/2017 s/p TEE/DCCV; b. CHA2DS2VASc = 2-->warfarin.   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 04/2020 s/p 27/29 mm Onyx mech mitral valve-->chronic coumadin ; b. 05/2020 Echo: Nl fxn'ing mech MV.    Family History: Family History  Problem Relation Age of Onset   Breast cancer Mother    Cancer Mother 33       breast   Hypertension Mother    Osteoarthritis Father    Hypertension Father    Cancer Maternal Aunt        breast   Breast cancer Maternal Grandmother    Cancer Maternal Grandmother        breast    Social History   Socioeconomic History   Marital status: Married    Spouse name: Not on file   Number of children: Not on file   Years of education: Not on file   Highest education level: Not on file  Occupational History   Not on file  Tobacco Use   Smoking status: Former    Current packs/day: 0.00    Average packs/day: 0.5 packs/day for 20.0 years (10.0 ttl pk-yrs)    Types: Cigarettes    Start date: 01/1996    Quit date: 01/2016    Years since quitting: 7.8    Smokeless tobacco: Never  Vaping Use   Vaping status: Never Used  Substance and Sexual Activity   Alcohol use: Not Currently    Comment: rarely   Drug use: No   Sexual activity: Not on file  Other Topics Concern   Not on file  Social History Narrative   Not on file   Social Drivers of Health   Financial Resource Strain:  Medium Risk (03/11/2023)   Received from [facility]   Overall Financial Resource Strain (CARDIA)    Difficulty of Paying Living Expenses: Somewhat hard  Food Insecurity: Low Risk  (06/20/2023)   Received from [facility]   Hunger Vital Sign    Within the past 12 months, you worried that your food would run out before you got money to buy more: Never true    Within the past 12 months, the food you bought just didn't last and you didn't have money to get more. : Never true  Transportation Needs: No Transportation Needs (06/20/2023)   Received from [facility]    In the past 12 months, has lack of reliable transportation kept you from medical appointments, meetings, work or from getting things needed for daily living? : No  Physical Activity: Not on file  Stress: Not on file  Social Connections: Not on file  Intimate Partner Violence: Not At Risk (11/16/2021)   Humiliation, Afraid, Rape, and Kick questionnaire    Fear of Current or Ex-Partner: No    Emotionally Abused: No    Physically Abused: No    Sexually Abused: No      Review of Systems  Constitutional:  Negative for chills, fatigue and unexpected weight change.  HENT:  Positive for postnasal drip. Negative for congestion, rhinorrhea, sneezing and sore throat.   Eyes:  Negative for redness.  Respiratory:  Negative for cough, chest tightness and shortness of breath.   Cardiovascular:  Negative for chest pain and palpitations.  Gastrointestinal:  Negative for abdominal pain, constipation, diarrhea, nausea and vomiting.  Genitourinary:  Positive for flank pain. Negative  for frequency.  Musculoskeletal:  Positive for back pain. Negative for arthralgias, joint swelling and neck pain.  Skin:  Negative for rash.  Neurological: Negative.  Negative for tremors and numbness.  Hematological:  Negative for adenopathy. Does not bruise/bleed easily.  Psychiatric/Behavioral:  Negative for behavioral problems (Depression), sleep disturbance and suicidal ideas. The patient is not nervous/anxious.     Vital Signs: BP 115/60   Pulse 73   Temp 98 F (36.7 C)   Resp 16   Ht 5' 4 (1.626 m)   Wt 232 lb (105.2 kg)   SpO2 100%   BMI 39.82 kg/m    Physical Exam Vitals and nursing note reviewed.  Constitutional:      Appearance: Normal appearance.  HENT:     Head: Normocephalic and atraumatic.  Eyes:     Extraocular Movements: Extraocular movements intact.  Cardiovascular:     Rate and Rhythm: Normal rate and regular rhythm.     Pulses: Normal pulses.     Heart sounds: Normal heart sounds.  Pulmonary:     Effort: Pulmonary effort is normal.     Breath sounds: Normal breath sounds.  Skin:    General: Skin is warm and dry.  Neurological:     General: No focal deficit present.     Mental Status: She is alert.  Psychiatric:        Mood and Affect: Mood normal.        Behavior: Behavior normal.        Assessment/Plan: 1. Palpitations (Primary) Improved today, but significant yesterday leading to ED visit. Will follow up with cardiology, already wearing zio monitor  2. Paroxysmal atrial fibrillation (HCC) Hx of Maze procedure, followed by cardiology with recent zio for re-evaluation  3. Urinary tract infection without hematuria, site unspecified Will send for culture and  treat accordingly - CULTURE, URINE COMPREHENSIVE  4. Dysuria - POCT Urinalysis Dipstick   General Counseling: Aroush verbalizes understanding of the findings of todays visit and agrees with plan of treatment. I have discussed any further diagnostic evaluation that may be needed  or ordered today. We also reviewed her medications today. she has been encouraged to call the office with any questions or concerns that should arise related to todays visit.    Orders Placed This Encounter  Procedures   CULTURE, URINE COMPREHENSIVE   POCT Urinalysis Dipstick    No orders of the defined types were placed in this encounter.   This patient was seen by Tinnie Pro, PA-C in collaboration with Dr. Sigrid Bathe as a part of collaborative care agreement.   Total time spent:30 Minutes Time spent includes review of chart, medications, test results, and follow up plan with the patient.      Dr Fozia M Khan Internal medicine

## 2023-11-26 LAB — CULTURE, URINE COMPREHENSIVE

## 2023-11-30 ENCOUNTER — Other Ambulatory Visit: Payer: Self-pay | Admitting: Cardiovascular Disease

## 2023-11-30 ENCOUNTER — Ambulatory Visit: Payer: Self-pay | Admitting: Physician Assistant

## 2023-12-07 ENCOUNTER — Other Ambulatory Visit: Payer: Self-pay | Admitting: Cardiovascular Disease

## 2023-12-07 DIAGNOSIS — E785 Hyperlipidemia, unspecified: Secondary | ICD-10-CM

## 2023-12-08 ENCOUNTER — Encounter: Payer: Self-pay | Admitting: Physician Assistant

## 2023-12-08 ENCOUNTER — Telehealth: Payer: Self-pay

## 2023-12-08 ENCOUNTER — Ambulatory Visit (INDEPENDENT_AMBULATORY_CARE_PROVIDER_SITE_OTHER): Admitting: Physician Assistant

## 2023-12-08 ENCOUNTER — Ambulatory Visit
Admission: RE | Admit: 2023-12-08 | Discharge: 2023-12-08 | Disposition: A | Discharge status: Home / Self Care | Source: Ambulatory Visit | Attending: Physician Assistant | Admitting: Physician Assistant

## 2023-12-08 VITALS — BP 125/70 | HR 66 | Temp 98.0°F | Resp 16 | Ht 64.0 in | Wt 235.0 lb

## 2023-12-08 DIAGNOSIS — M79601 Pain in right arm: Secondary | ICD-10-CM | POA: Insufficient documentation

## 2023-12-08 DIAGNOSIS — R5383 Other fatigue: Secondary | ICD-10-CM

## 2023-12-08 DIAGNOSIS — M7989 Other specified soft tissue disorders: Secondary | ICD-10-CM | POA: Insufficient documentation

## 2023-12-08 DIAGNOSIS — Z1231 Encounter for screening mammogram for malignant neoplasm of breast: Secondary | ICD-10-CM

## 2023-12-08 DIAGNOSIS — E1165 Type 2 diabetes mellitus with hyperglycemia: Secondary | ICD-10-CM | POA: Diagnosis not present

## 2023-12-08 DIAGNOSIS — R300 Dysuria: Secondary | ICD-10-CM

## 2023-12-08 DIAGNOSIS — Z0001 Encounter for general adult medical examination with abnormal findings: Secondary | ICD-10-CM

## 2023-12-08 NOTE — Telephone Encounter (Signed)
 Pt notified that US  was normal, negative for DVT. Per Lauren continue to use heat/ice to keep comfortable.

## 2023-12-08 NOTE — Progress Notes (Signed)
 [facility] [address] [place], [place] [address]  Internal MEDICINE  Office Visit Note  Patient Name: Leslie [name]  [id number]  [id number]  Date of Service: 12/08/2023  Chief Complaint  Patient presents with   Medicare Wellness   Gastroesophageal Reflux   Hypertension   Hyperlipidemia   Arm Pain    Arm for 3 weeks since getting IV    HPI Leslie [name] presents for an annual well visit Well-appearing 58 y.o.female  Routine CRC screening: UTD Routine mammogram: DUE Pap smear: UTD Labs: pt requests to recheck iron levels as she has been craving salt and feels like maybe iron has been dropping Other concerns: - right side IV 2-3 weeks ago when she went by ambulance to ED, and since then arm has been painful with a knot near site. States the person had a hard time with the IV insertion and was moving it around a lot. Arm has been hurting all the way up into bicep as well. She went to an appt with her husband on Saturday and was actually seen at clinic herself to have them evaluate her arm. She was told to go to ER due to concern for blood clot vs phlebitis. She decided not to go and wants another opinion in office today. May be slightly better than it was. She has tried heat and ice. Taking tylenol  for the pain. -A little SOB, but often has this from her heart and states they lowered her torsemide  and is up a few pounds and attributes this to a little fluid retention.  -cardiology next week     12/08/2023   11:27 AM 11/19/2022    9:06 AM 09/22/2018    2:06 PM  MMSE - Mini Mental State Exam  Orientation to time 5 5 5   Orientation to Place 5 5 5   Registration 3 3 3   Attention/ Calculation 5 5 5   Recall 3 3 3   Language- name 2 objects 2 2 2   Language- repeat 1 1 1   Language- follow 3 step command 3 3 3   Language- read & follow direction 1 1 1   Write a sentence 1 1 1   Copy design 1 1 1   Total score 30 30 30     Functional Status Survey:       10/25/2022    3:01  PM 11/19/2022    9:06 AM 02/08/2023   10:02 AM 06/06/2023   10:17 AM 11/21/2023    9:04 AM  Fall Risk  Falls in the past year? 0 1 1 0 0  Was there an injury with Fall? 0 0 0    Fall Risk Category Calculator 0 1 1    Patient at Risk for Falls Due to   History of fall(s)    Fall risk Follow up   Falls evaluation completed         11/21/2023    9:04 AM  Depression screen PHQ 2/9  Decreased Interest 0  Down, Depressed, Hopeless 0  PHQ - 2 Score 0        No data to display            Current Medication: Outpatient Encounter Medications as of 12/08/2023  Medication Sig   albuterol  (VENTOLIN  HFA) 108 (90 Base) MCG/ACT inhaler Inhale 2 puffs into the lungs every 6 (six) hours as needed for wheezing or shortness of breath.   allopurinol (ZYLOPRIM) 100 MG tablet Take 100 mg by mouth daily.   ALPRAZolam  (XANAX ) 0.5 MG tablet TAKE  1/2 TABLET BY MOUTH AT BEDTIME AS NEEDED FOR ANXIETY   amLODipine  (NORVASC ) 5 MG tablet Take 1 tablet (5 mg total) by mouth daily.   aspirin  81 MG EC tablet Take 81 mg by mouth daily.   atorvastatin  (LIPITOR ) 80 MG tablet Take 1 tablet (80 mg total) by mouth daily.   calcium  carbonate (TUMS - DOSED IN MG ELEMENTAL CALCIUM ) 500 MG chewable tablet Chew 500 mg by mouth daily as needed for indigestion or heartburn.   dapagliflozin propanediol (FARXIGA) 10 MG TABS tablet Take 10 mg by mouth daily.   ezetimibe  (ZETIA ) 10 MG tablet Take 1 tablet (10 mg total) by mouth daily.   famciclovir  (FAMVIR ) 500 MG tablet TAKE ONE TAB BY MOUTH TWICE A DAY FOR INFECTION   Fluticasone -Umeclidin-Vilant (TRELEGY ELLIPTA ) 100-62.5-25 MCG/ACT AEPB Inhale 1 puff into the lungs daily.   KLOR-CON  M20 20 MEQ tablet TAKE 2 TABLETS BY MOUTH TWICE A DAY   losartan (COZAAR) 25 MG tablet Take 1 tablet by mouth daily.   metoprolol  succinate (TOPROL -XL) 100 MG 24 hr tablet Take 1 tablet (100 mg total) by mouth daily.   omeprazole  (PRILOSEC) 40 MG capsule TAKE 1 CAPSULE (40 MG TOTAL) BY MOUTH  DAILY.   Semaglutide ,0.25 or 0.5MG /DOS, (OZEMPIC , 0.25 OR 0.5 MG/DOSE,) 2 MG/3ML SOPN INJECT 0.5MG  ONCE A WEEK   spironolactone  (ALDACTONE ) 25 MG tablet TAKE 1 TABLET (25 MG TOTAL) BY MOUTH DAILY.   torsemide  (DEMADEX ) 10 MG tablet TAKE 1 TABLET BY MOUTH TWICE A DAY   warfarin (COUMADIN ) 2 MG tablet TAKE 1-2 TABLETS DAILY OR AS PRESCRIBED BY COUMADIN  CLINIC   No facility-administered encounter medications on file as of 12/08/2023.    Surgical History: Past Surgical History:  Procedure Laterality Date   ABDOMINAL HYSTERECTOMY     total   ABDOMINAL HYSTERECTOMY     BREAST BIOPSY Bilateral 2012   BREAST BIOPSY Right 08-07-12   fibroadenomatous changes and columnar cells   BREAST BIOPSY  02/03/2015   stereo byrnett   BUBBLE STUDY  04/01/2020   Procedure: BUBBLE STUDY;  Surgeon: Loni Soyla LABOR, MD;  Location: [facility] ENDOSCOPY;  Service: Cardiovascular;;   CARDIAC CATHETERIZATION     CHOLECYSTECTOMY N/A 02/27/2016   Procedure: LAPAROSCOPIC CHOLECYSTECTOMY;  Surgeon: Louanne KANDICE Muse, MD;  Location: [facility] ORS;  Service: General;  Laterality: N/A;   CLIPPING OF ATRIAL APPENDAGE  04/29/2020   Procedure: CLIPPING OF ATRIAL APPENDAGE USING ATRICURE  PRO2 CLIP SIZE ;  Surgeon: Dusty Sudie DEL, MD;  Location: [facility] OR;  Service: Open Heart Surgery;;   COLONOSCOPY WITH PROPOFOL  N/A 09/26/2018   Procedure: COLONOSCOPY WITH PROPOFOL ;  Surgeon: Jinny Carmine, MD;  Location: [facility] ENDOSCOPY;  Service: Endoscopy;  Laterality: N/A;   COLONOSCOPY WITH PROPOFOL  N/A 07/16/2021   Procedure: COLONOSCOPY WITH PROPOFOL ;  Surgeon: Jinny Carmine, MD;  Location: [facility] ENDOSCOPY;  Service: Endoscopy;  Laterality: N/A;   CYSTOSCOPY W/ RETROGRADES Right 08/18/2018   Procedure: CYSTOSCOPY WITH RETROGRADE PYELOGRAM;  Surgeon: Francisca Redell BROCKS, MD;  Location: [facility] ORS;  Service: Urology;  Laterality: Right;   CYSTOSCOPY/URETEROSCOPY/HOLMIUM LASER/STENT PLACEMENT Right 08/18/2018   Procedure: CYSTOSCOPY/URETEROSCOPY/STENT  PLACEMENT;  Surgeon: Francisca Redell BROCKS, MD;  Location: [facility] ORS;  Service: Urology;  Laterality: Right;   DIAGNOSTIC LAPAROSCOPY     ESOPHAGOGASTRODUODENOSCOPY N/A 07/16/2021   Procedure: ESOPHAGOGASTRODUODENOSCOPY (EGD);  Surgeon: Jinny Carmine, MD;  Location: [facility] ENDOSCOPY;  Service: Endoscopy;  Laterality: N/A;   ESOPHAGOGASTRODUODENOSCOPY (EGD) WITH PROPOFOL  N/A 09/26/2018   Procedure: ESOPHAGOGASTRODUODENOSCOPY (EGD) WITH PROPOFOL ;  Surgeon: Jinny Carmine,  MD;  Location: [facility] ENDOSCOPY;  Service: Endoscopy;  Laterality: N/A;   GIVENS CAPSULE STUDY N/A 11/03/2018   Procedure: GIVENS CAPSULE STUDY;  Surgeon: Jinny Carmine, MD;  Location[facility] END[facility];  Service: Endoscopy;  Laterality: N/A;   JOINT REPLACEMENT Left    knee   KNEE ARTHROPLASTY Right 04/09/2019   Procedure: COMPUTER ASSISTED TOTAL KNEE ARTHROPLASTY;  Surgeon: Mardee Lynwood SQUIBB, MD;  Location: [facility] ORS;  Service: Orthopedics;  Laterality: Right;   KNEE CLOSED REDUCTION Left 04/15/2015   Procedure: CLOSED MANIPULATION KNEE;  Surgeon: Franky Cranker, MD;  Location: [facility] ORS;  Service: Orthopedics;  Laterality: Left;   KNEE SURGERY     MAZE N/A 04/29/2020   Procedure: MAZE;  Surgeon: Dusty Sudie DEL, MD;  Location: [facility] OR;  Service: Open Heart Surgery;  Laterality: N/A;   MITRAL VALVE REPLACEMENT N/A 04/29/2020   Procedure: MITRAL VALVE (MV) REPLACEMENT USING ON-X VALVE SIZE 27/29MM;  Surgeon: Dusty Sudie DEL, MD;  Location: [facility] OR;  Service: Open Heart Surgery;  Laterality: N/A;   MULTIPLE EXTRACTIONS WITH ALVEOLOPLASTY N/A 02/28/2020   Procedure: MULTIPLE EXTRACTION WITH ALVEOLOPLASTY;  Surgeon: Celena Lum NOVAK, DMD;  Location: [facility] OR;  Service: Dentistry;  Laterality: N/A;   RIGHT/LEFT HEART CATH AND CORONARY ANGIOGRAPHY Bilateral 09/06/2019   Procedure: RIGHT/LEFT HEART CATH AND CORONARY ANGIOGRAPHY;  Surgeon: Perla Evalene PARAS, MD;  Location: [facility] INVASIVE CV LAB;  Service: Cardiovascular;  Laterality: Bilateral;   TEE WITHOUT  CARDIOVERSION N/A 09/13/2017   Procedure: TRANSESOPHAGEAL ECHOCARDIOGRAM (TEE);  Surgeon: Mady Bruckner, MD;  Location: [facility] ORS;  Service: Cardiovascular;  Laterality: N/A;   TEE WITHOUT CARDIOVERSION N/A 04/01/2020   Procedure: TRANSESOPHAGEAL ECHOCARDIOGRAM (TEE);  Surgeon: Loni Soyla LABOR, MD;  Location: [facility] ENDOSCOPY;  Service: Cardiovascular;  Laterality: N/A;   TEE WITHOUT CARDIOVERSION N/A 04/29/2020   Procedure: TRANSESOPHAGEAL ECHOCARDIOGRAM (TEE);  Surgeon: Dusty Sudie DEL, MD;  Location: [facility] OR;  Service: Open Heart Surgery;  Laterality: N/A;   TOTAL KNEE ARTHROPLASTY Left 12/25/2014   Procedure: TOTAL KNEE ARTHROPLASTY;  Surgeon: Franky Cranker, MD;  Location: [facility] ORS;  Service: Orthopedics;  Laterality: Left;   TUBAL LIGATION      Medical History: Past Medical History:  Diagnosis Date   (HFpEF) heart failure with preserved ejection fraction (HCC)    a. 08/2017 Echo: EF 55-60%.  Grade 2 diastolic dysfunction; b. 05/2020 Echo: EF 50-55%, no rwma, Nl RV fxn, nl fxn'ing mech MV.   Allergy    Anemia    Anxiety    Asthma    BRCA negative 03/22/2013   Bronchitis 02/19/2016   ON LEVAQUIN  PO   Chest tightness    a. 08/2019 Cath: nl cors.   Cigarette smoker 09/11/2017   8-10 day   Dysrhythmia    GERD (gastroesophageal reflux disease)    History of kidney stones    Hyperlipidemia    Hypertension    Interstitial lung disease (HCC)    a. CT 2013 b. 02/2018 CXR noted recurrent intersistial changes ILD vs chronic bronchitis   Moderate mitral stenosis    a.  08/2017 TEE: EF 60 to 65%.  Moderate mitral stenosis.  Mean gradient 14 mmHg.  Valve area 2.59 cm by planimetry, 2.72 cm by pressure half-time.   PAF (paroxysmal atrial fibrillation) (HCC)    a. 08/2017 s/p TEE/DCCV; b. CHA2DS2VASc = 2-->warfarin.   Pneumonia    S/P Maze operation for atrial fibrillation 04/29/2020   Complete bilateral atrial lesion set using bipolar radiofrequency and cryothermy ablation with clipping of LA  appendage   S/P mitral valve replacement with Onyx bileaflet mechanical valve 04/29/2020   a. 04/2020 s/p 27/29 mm Onyx mech mitral valve-->chronic coumadin ; b. 05/2020 Echo: Nl fxn'ing mech MV.    Family History: Family History  Problem Relation Age of Onset   Breast cancer Mother    Cancer Mother 64       breast   Hypertension Mother    Osteoarthritis Father    Hypertension Father    Cancer Maternal Aunt        breast   Breast cancer Maternal Grandmother    Cancer Maternal Grandmother        breast    Social History   Socioeconomic History   Marital status: Married    Spouse name: Not on file   Number of children: Not on file   Years of education: Not on file   Highest education level: Not on file  Occupational History   Not on file  Tobacco Use   Smoking status: Former    Current packs/day: 0.00    Average packs/day: 0.5 packs/day for 20.0 years (10.0 ttl pk-yrs)    Types: Cigarettes    Start date: 01/1996    Quit date: 01/2016    Years since quitting: 7.8   Smokeless tobacco: Never  Vaping Use   Vaping status: Never Used  Substance and Sexual Activity   Alcohol use: Not Currently    Comment: rarely   Drug use: No   Sexual activity: Not on file  Other Topics Concern   Not on file  Social History Narrative   Not on file   Social Drivers of Health   Financial Resource Strain: Medium Risk (03/11/2023)   Received from [facility]   Overall Financial Resource Strain (CARDIA)    Difficulty of Paying Living Expenses: Somewhat hard  Food Insecurity: Low Risk  (06/20/2023)   Received from [facility]   Hunger Vital Sign    Within the past 12 months, you worried that your food would run out before you got money to buy more: Never true    Within the past 12 months, the food you bought just didn't last and you didn't have money to get more. : Never true  Transportation Needs: No Transportation Needs (06/20/2023)   Received from [employer]    In the past 12 months, has lack of reliable transportation kept you from medical appointments, meetings, work or from getting things needed for daily living? : No  Physical Activity: Not on file  Stress: Not on file  Social Connections: Not on file  Intimate Partner Violence: Not At Risk (11/16/2021)   Humiliation, Afraid, Rape, and Kick questionnaire    Fear of Current or Ex-Partner: No    Emotionally Abused: No    Physically Abused: No    Sexually Abused: No      Review of Systems  Constitutional:  Positive for fatigue. Negative for chills and unexpected weight change.  HENT:  Negative for congestion, rhinorrhea, sneezing and sore throat.   Eyes:  Negative for redness.  Respiratory:  Positive for shortness of breath. Negative for cough, chest tightness and wheezing.   Cardiovascular:  Negative for chest pain and palpitations.  Gastrointestinal:  Negative for abdominal pain, constipation, diarrhea, nausea and vomiting.  Genitourinary:  Negative for dysuria and frequency.  Musculoskeletal:  Negative for arthralgias, back pain, joint swelling and neck pain.  Skin:  Positive for color change.  Knot along right arm with pain up arm  Neurological: Negative.  Negative for tremors and numbness.  Hematological:  Negative for adenopathy. Does not bruise/bleed easily.  Psychiatric/Behavioral:  Negative for behavioral problems (Depression), sleep disturbance and suicidal ideas. The patient is not nervous/anxious.     Vital Signs: BP 125/70   Pulse 66   Temp 98 F (36.7 C)   Resp 16   Ht 5' 4 (1.626 m)   Wt 235 lb (106.6 kg)   SpO2 98%   BMI 40.34 kg/m    Physical Exam Vitals and nursing note reviewed.  Constitutional:      Appearance: Normal appearance.  HENT:     Head: Normocephalic and atraumatic.  Eyes:     Extraocular Movements: Extraocular movements intact.  Cardiovascular:     Rate and Rhythm: Normal rate and regular rhythm.     Pulses: Normal  pulses.     Heart sounds: Normal heart sounds.  Pulmonary:     Effort: Pulmonary effort is normal.     Breath sounds: Normal breath sounds.  Skin:    Comments: Knot along right AC fossa with tenderness proximal to this along upper right arm.  Neurological:     General: No focal deficit present.     Mental Status: She is alert.  Psychiatric:        Mood and Affect: Mood normal.        Behavior: Behavior normal.        Assessment/Plan: 1. Encounter for Medicare annual examination with abnormal findings (Primary) AWV performed, mammogram due  2. Pain and swelling of right upper extremity Will check US  to evaluate for possible DVT vs phlebitis. Patient is already on warfarin. Advised to go to ED if any new or worsening symptoms, especially worsening SOB - US  Venous Img Upper Uni Right(DVT); Future  3. Type 2 diabetes mellitus with hyperglycemia, without long-term current use of insulin  (HCC) - Hgb A1C w/o eAG  4. Visit for screening mammogram - MM 3D SCREENING MAMMOGRAM BILATERAL BREAST; Future  5. Other fatigue - Fe+TIBC+Fer  6. Dysuria - UA/M w/rflx Culture, Routine     General Counseling: Jolana verbalizes understanding of the findings of todays visit and agrees with plan of treatment. I have discussed any further diagnostic evaluation that may be needed or ordered today. We also reviewed her medications today. she has been encouraged to call the office with any questions or concerns that should arise related to todays visit.    Orders Placed This Encounter  Procedures   MM 3D SCREENING MAMMOGRAM BILATERAL BREAST   US  Venous Img Upper Uni Right(DVT)   UA/M w/rflx Culture, Routine   Hgb A1C w/o eAG   Fe+TIBC+Fer    No orders of the defined types were placed in this encounter.   Return in about 4 months (around 04/06/2024) for general follow up.   Total time spent:35 Minutes Time spent includes review of chart, medications, test results, and follow up plan  with the patient.   [place] Controlled Substance Database was reviewed by me.  This patient was seen by Tinnie Pro, PA-C in collaboration with Dr. Sigrid Bathe as a part of collaborative care agreement.  Tinnie Pro, PA-C Internal medicine

## 2023-12-09 ENCOUNTER — Other Ambulatory Visit
Admission: RE | Admit: 2023-12-09 | Discharge: 2023-12-09 | Disposition: A | Discharge status: Home / Self Care | Attending: Physician Assistant | Admitting: Physician Assistant

## 2023-12-09 ENCOUNTER — Telehealth: Payer: Self-pay | Admitting: Physician Assistant

## 2023-12-09 DIAGNOSIS — R5383 Other fatigue: Secondary | ICD-10-CM | POA: Insufficient documentation

## 2023-12-09 DIAGNOSIS — E1165 Type 2 diabetes mellitus with hyperglycemia: Secondary | ICD-10-CM | POA: Diagnosis present

## 2023-12-09 LAB — UA/M W/RFLX CULTURE, ROUTINE
Bilirubin, UA: NEGATIVE
Ketones, UA: NEGATIVE
Leukocytes,UA: NEGATIVE
Nitrite, UA: NEGATIVE
Protein,UA: NEGATIVE
RBC, UA: NEGATIVE
Specific Gravity, UA: 1.009 (ref 1.005–1.030)
Urobilinogen, Ur: 0.2 mg/dL (ref 0.2–1.0)
pH, UA: 6.5 (ref 5.0–7.5)

## 2023-12-09 LAB — IRON AND TIBC
Iron: 58 ug/dL (ref 28–170)
Saturation Ratios: 16 % (ref 10.4–31.8)
TIBC: 367 ug/dL (ref 250–450)
UIBC: 309 ug/dL

## 2023-12-09 LAB — HEMOGLOBIN A1C
Hgb A1c MFr Bld: 5.9 % — ABNORMAL HIGH (ref 4.8–5.6)
Mean Plasma Glucose: 122.63 mg/dL

## 2023-12-09 LAB — MICROSCOPIC EXAMINATION
Bacteria, UA: NONE SEEN
Casts: NONE SEEN /LPF
RBC, Urine: NONE SEEN /HPF (ref 0–2)
WBC, UA: NONE SEEN /HPF (ref 0–5)

## 2023-12-09 LAB — FERRITIN: Ferritin: 201 ng/mL (ref 11–307)

## 2023-12-09 NOTE — Telephone Encounter (Signed)
 Notified patient of mammo appointment date, arrival time, location -Sheralyn Boatman

## 2023-12-13 ENCOUNTER — Ambulatory Visit: Payer: Self-pay | Admitting: Physician Assistant

## 2023-12-13 DIAGNOSIS — R002 Palpitations: Secondary | ICD-10-CM | POA: Diagnosis not present

## 2023-12-13 NOTE — Progress Notes (Signed)
 Average heart rate of 72 bpm, 8 tachycardia episodes noted with the longest 1 lasting 12 beats, rare early beats.  There were 17 triggered events with normal sinus rhythm.  No atrial fibrillation noted on the monitor.  Will discuss potential medication changes on return appointment.

## 2023-12-14 NOTE — Telephone Encounter (Signed)
-----   Message from Tinnie MARLA Pro sent at 12/13/2023  1:27 PM EST ----- Please let her know her iron levels are normal and A1c is stable at 5.9 ----- Message ----- From: Interface, Lab In [place] Sent: 12/09/2023   9:57 AM EST To: Tinnie MARLA Pro, PA-C

## 2023-12-14 NOTE — Telephone Encounter (Signed)
Spoke with patient regarding labs.

## 2023-12-21 ENCOUNTER — Ambulatory Visit: Attending: Cardiovascular Disease

## 2023-12-21 DIAGNOSIS — Z5181 Encounter for therapeutic drug level monitoring: Secondary | ICD-10-CM

## 2023-12-21 DIAGNOSIS — I050 Rheumatic mitral stenosis: Secondary | ICD-10-CM | POA: Diagnosis not present

## 2023-12-21 DIAGNOSIS — I4891 Unspecified atrial fibrillation: Secondary | ICD-10-CM | POA: Diagnosis not present

## 2023-12-21 LAB — POCT INR: INR: 2.1 (ref 2.0–3.0)

## 2023-12-21 NOTE — Patient Instructions (Signed)
 Take 2 tablets today only then Continue 1 tablet daily except 1/2 tablet on Fridays. Stay consistent with greens.  - Recheck INR in 6 weeks [phone]

## 2023-12-22 ENCOUNTER — Encounter: Payer: Self-pay | Admitting: Cardiology

## 2023-12-22 ENCOUNTER — Ambulatory Visit: Attending: Cardiology | Admitting: Cardiology

## 2023-12-22 VITALS — BP 120/58 | HR 78 | Ht 64.0 in | Wt 237.6 lb

## 2023-12-22 DIAGNOSIS — R002 Palpitations: Secondary | ICD-10-CM | POA: Diagnosis not present

## 2023-12-22 DIAGNOSIS — I10 Essential (primary) hypertension: Secondary | ICD-10-CM

## 2023-12-22 DIAGNOSIS — E785 Hyperlipidemia, unspecified: Secondary | ICD-10-CM

## 2023-12-22 DIAGNOSIS — N184 Chronic kidney disease, stage 4 (severe): Secondary | ICD-10-CM

## 2023-12-22 DIAGNOSIS — I480 Paroxysmal atrial fibrillation: Secondary | ICD-10-CM | POA: Diagnosis not present

## 2023-12-22 DIAGNOSIS — I5032 Chronic diastolic (congestive) heart failure: Secondary | ICD-10-CM | POA: Diagnosis not present

## 2023-12-22 DIAGNOSIS — I050 Rheumatic mitral stenosis: Secondary | ICD-10-CM | POA: Diagnosis not present

## 2023-12-22 DIAGNOSIS — Z954 Presence of other heart-valve replacement: Secondary | ICD-10-CM

## 2023-12-22 MED ORDER — METOPROLOL SUCCINATE ER 100 MG PO TB24: 100.0000 mg | ORAL_TABLET | Freq: Every day | ORAL | 3 refills | Status: AC

## 2023-12-22 NOTE — Patient Instructions (Signed)
 Medication Instructions:  Your physician recommends the following medication changes.  CHANGE: Toprol  XL 100 mg daily, with additional 50 mg as needed for palpitations  *If you need a refill on your cardiac medications before your next appointment, please call your pharmacy*  Lab Work: No labs ordered today  If you have labs (blood work) drawn today and your tests are completely normal, you will receive your results only by: MyChart Message (if you have MyChart) OR A paper copy in the mail If you have any lab test that is abnormal or we need to change your treatment, we will call you to review the results.  Testing/Procedures: No test ordered today   Follow-Up: At [facility], you and your health needs are our priority.  As part of our continuing mission to provide you with exceptional heart care, our providers are all part of one team.  This team includes your primary Cardiologist (physician) and Advanced Practice Providers or APPs (Physician Assistants and Nurse Practitioners) who all work together to provide you with the care you need, when you need it.  Your next appointment:   3 month(s)  Provider:   You may see Timothy Gollan, MD or one of the following Advanced Practice Providers on your designated Care Team:   Tylene Lunch, NP

## 2023-12-22 NOTE — Progress Notes (Signed)
 Cardiology Office Note   Date:  12/23/2023  ID:  ANTONAE ZBIKOWSKI, DOB 1965-04-21, MRN [id number] PCP: Kristina Tinnie [name]  [facility] Providers Cardiologist:  Evalene Lunger, MD     History of Present Illness Leslie [name] is a 58 y.o. female with a past medical history of mitral valve stenosis status post MVR with concomitant Maze procedure, morbid obesity, hyperlipidemia, COPD, CKD stage IV, who presents today for follow-up.   She was hospitalized 08/2017 for chest tightness.  She was found to be in A-fib RVR and underwent TEE that showed moderate mitral stenosis and she was cardioverted.  Cardiac CTA in 2020 for chest tightness showed nonobstructive CAD.  She had progressive mitral stenosis on serial echocardiograms with severe mitral stenosis on echo in 06/2019.  Right and left heart catheterization showed moderate pulmonary hypertension with no other significant coronary artery disease.  Patient was referred to Dr. Leontine of CT surgery and she underwent mitral valve replacement with ON-X bileaflet mitral valve with maze procedure 04/2020.  She was started on Coumadin  postprocedure.  Follow-up echocardiogram 11/2020 showed an EF of 50-55% with no MV regurgitation.  Heart monitor showed normal sinus rhythm, average heart rate 77 bpm, 7 runs of SVT, lasting 6 beats with no atrial fibrillation noted.  Repeat echocardiogram in 2023 showed normal functioning valve.  She was seen 11/2022 reporting chest and back pain.  PET stress showed small perfusion defect related to apical thinning but cannot exclude mild apical ischemia.  Defect 1 was a small defect with mild reduction in uptake present in the apical anterior and lateral location that was reversible.  There was normal wall motion consistent with artifact.  And systolic cavity was normal.  Coronary calcium  present on attenuation correction CT images, mild coronary calcifications present, mitral valve replacement noted.  Echo in  01/2023 showed LVEF 60 to 65%, no RWMA, mitral valve that was repaired/replaced with a mean gradient of 5 mmHg.  She was evaluated in 04/08/2023 and was stable from the cardiac perspective.  She was seen 07/11/2023 doing okay with cardiac perspective.  She recently seen pulmonary was placed on Trelegy help with her breathing.  Nephrology told her to take her torsemide  as needed.  Spironolactone  was decreased to 12.5 mg twice daily at her last visit.  She was scheduled for BMP in 2 weeks.  She was also advised to hold her Lipitor  and ezetimibe  added to his 6-week statin elevated due to myalgias.  She was last seen in clinic 09/23/2023 and had called and to the triage line complaining of shortness of breath and chest pain.  She would have been progressive shortness of breath and 3 pillow orthopnea.  She had recent exposure to COVID-19 and did a swab which was negative.  She was recommended to repeat her swab.  She was sent for updated lab abs.  She was also placed on a ZIO XT monitor for 14 days to reassess arrhythmia ectopics or pauses.    She returns to clinic today stating that she has been doing well from a cardiac perspective.  She denies any reoccurrence of shortness of breath or chest pain.  She states that her breathing has improved.  States that she has been compliant with her current medication regimen without side effects.  States the palpitations are better and only on occasion.  States that she has been compliant with her warfarin without any instances of bleeding and no blood noted in her urine or stool.  She  recently had to have a tooth removed as a dental abscess and was evaluated in the emergency department with an unrevealing workup.  She stated that she had chest discomfort shortness of breath and palpitations due to her tooth EMS was called as she had pulled over to the parking lot of the [facility] administered nitro but her symptoms had all resolved.  Since her discharge she has had no  further reoccurrence or episodes.    ROS: 10 point review of systems has been reviewed and considered negative the exception was been listed in the HPI  Studies Reviewed     Event monitor (Zio) 12/08/2023 Normal sinus rhythm Patient had a min HR of 51 bpm, max HR of 167 bpm, and avg HR of 72 bpm.    8 Supraventricular Tachycardia/atrial tachycardia runs occurred, the run with the fastest interval lasting 5 beats with a max rate of 167 bpm, the longest lasting 12 beats with an avg rate of 124 bpm.    Isolated SVEs were rare (<1.0%), SVE Couplets were rare (<1.0%), and SVE Triplets were rare (<1.0%).    Isolated VEs were rare (<1.0%), VE Couplets were rare (<1.0%), and no VE Triplets were present.   Patient triggered events (17) associated with normal sinus rhythm  Echo 01/2023 1. Left ventricular ejection fraction, by estimation, is 60 to 65%. The  left ventricle has normal function. The left ventricle has no regional  wall motion abnormalities. Left ventricular diastolic parameters are  indeterminate. The average left  ventricular global longitudinal strain is -17.3 %. The global longitudinal  strain is normal.   2. Right ventricular systolic function is normal. The right ventricular  size is normal.   3. The mitral valve has been repaired/replaced. No evidence of mitral  valve regurgitation. The mean mitral valve gradient is 5.0 mmHg. There is  a bioprosthetic valve present in the mitral position.   4. The aortic valve was not well visualized. Aortic valve regurgitation  is not visualized.   5. The inferior vena cava is normal in size with greater than 50%  respiratory variability, suggesting right atrial pressure of 3 mmHg.     Cardiac PET stress 12/2022   Findings are consistent with no infarction. The study is intermediate risk.  Likely, small perfusion defect is related to apical thinning, as stress flow is normal.  Cannot exclude mild apical ischemia.   LV perfusion is  abnormal. Defect 1: There is a small defect with mild reduction in uptake present in the apical anterior and lateral location(s) that is reversible. There is normal wall motion in the defect area. Consistent with artifact.   End diastolic cavity size is normal. End systolic cavity size is normal.   Myocardial blood flow was computed to be 0.75ml/g/min at rest and 2.01ml/g/min at stress. Global myocardial blood flow reserve was 2.07 and was normal.   Coronary calcium  was present on the attenuation correction CT images. Mild coronary calcifications were present. Mitral valve replacement noted with post procedural pericardial calcification.  Aortic atherosclerosis noted. Coronary calcifications were present in the left anterior descending artery and right coronary artery distribution(s).   Risk Assessment/Calculations  CHA2DS2-VASc Score = 4   This indicates a 4.8% annual risk of stroke. The patient's score is based upon: CHF History: 1 HTN History: 1 Diabetes History: 0 Stroke History: 0 Vascular Disease History: 1 Age Score: 0 Gender Score: 1            Physical Exam VS:  BP ROLLEN)  120/58 (BP Location: Left Arm, Patient Position: Sitting, Cuff Size: Normal)   Pulse 78   Ht 5' 4 (1.626 m)   Wt 237 lb 9.6 oz (107.8 kg)   SpO2 97%   BMI 40.78 kg/m        Wt Readings from Last 3 Encounters:  12/22/23 237 lb 9.6 oz (107.8 kg)  12/08/23 235 lb (106.6 kg)  11/21/23 232 lb (105.2 kg)    GEN: Well nourished, well developed in no acute distress NECK: No JVD; No carotid bruits CARDIAC: RRR, II/VI systolic murmur, without rubs or gallops RESPIRATORY:  Clear to auscultation without rales, wheezing or rhonchi  ABDOMEN: Soft, non-tender, non-distended EXTREMITIES:  No edema; No deformity   ASSESSMENT AND PLAN Chronic HFpEF/chronic shortness of breath that has been stable echocardiogram completed in January 2025 revealed an LVEF of 60 to 65%, no RWMA, bioprosthetic valve present in the  mitral position.  She appears to be euvolemic on exam today.  She is continued on GDMT of Farxiga 10 mg daily, losartan 25 mg daily, Toprol -XL 100 mg daily, torsemide  20 mg twice daily and spironolactone  12.5 mg daily.  Uptitration is limited due to kidney function.  Status post MVR with a mean mitral valve gradient of 5.0 mmHg with bioprosthetic valve sitting in the mitral position on last echocardiogram.  Will continue to monitor with surveillance studies.  Hypertension with a blood pressure today 120/58.  Blood pressures remain stable.  She is continued on amlodipine  5 mg daily, losartan 25 mg daily, Toprol -XL 100 mg daily, and torsemide  10 mg twice daily.  She has been encouraged to continue to monitor pressures 1 to 2 hours postmedication administration at home as well.  Paroxysmal atrial fibrillation status post maze procedure where she remains in sinus rhythm today with a rate of 65.  She was previously placed on ZIO XT monitor which revealed an average heart rate of 72 bpm, a tachycardia episode started with the longest 1 lasting 12 beats and rare early beats.  There were 17 triggered events with normal sinus rhythm and no atrial fibrillation noted on the monitor.  With her symptoms improved since her last visit she has been advised that if need be she can take an additional half of her metoprolol  for breakthrough palpitations.  Hyperlipidemia with last LDL of 88.  She has been continued on atorvastatin  80 mg daily and ezetimibe  10 mg daily.  She states that she just recently had labs completed with her PCP.  Labs are noted in the system.  She will need updated lipid and hepatic panel on return.  CKD stage IV with last serum creatinine of 2.21 stable at baseline.  She continues to follow with nephrology.  She has been encouraged to maintain adequate hydration and avoid NSAIDs.       Dispo: Patient to return to clinic see MD/APP in 3 months or sooner if needed for further  evaluation  Signed, Bartholomew Ramesh, NP

## 2024-01-12 ENCOUNTER — Other Ambulatory Visit: Payer: Self-pay | Admitting: Physician Assistant

## 2024-01-14 ENCOUNTER — Other Ambulatory Visit: Payer: Self-pay | Admitting: Physician Assistant

## 2024-01-20 ENCOUNTER — Other Ambulatory Visit: Payer: Self-pay | Admitting: Physician Assistant

## 2024-01-20 DIAGNOSIS — F419 Anxiety disorder, unspecified: Secondary | ICD-10-CM

## 2024-01-24 ENCOUNTER — Ambulatory Visit
Admission: RE | Admit: 2024-01-24 | Discharge: 2024-01-24 | Disposition: A | Discharge status: Home / Self Care | Source: Ambulatory Visit | Attending: Physician Assistant | Admitting: Physician Assistant

## 2024-01-24 DIAGNOSIS — Z1231 Encounter for screening mammogram for malignant neoplasm of breast: Secondary | ICD-10-CM | POA: Diagnosis present

## 2024-01-26 ENCOUNTER — Other Ambulatory Visit: Payer: Self-pay | Admitting: Physician Assistant

## 2024-01-26 DIAGNOSIS — R928 Other abnormal and inconclusive findings on diagnostic imaging of breast: Secondary | ICD-10-CM

## 2024-01-30 ENCOUNTER — Encounter: Payer: Self-pay | Admitting: Internal Medicine

## 2024-02-01 ENCOUNTER — Telehealth: Payer: Self-pay

## 2024-02-01 ENCOUNTER — Ambulatory Visit: Attending: Cardiovascular Disease

## 2024-02-01 DIAGNOSIS — I050 Rheumatic mitral stenosis: Secondary | ICD-10-CM

## 2024-02-01 DIAGNOSIS — I4891 Unspecified atrial fibrillation: Secondary | ICD-10-CM

## 2024-02-01 DIAGNOSIS — Z5181 Encounter for therapeutic drug level monitoring: Secondary | ICD-10-CM

## 2024-02-01 LAB — POCT INR: INR: 2.9 (ref 2.0–3.0)

## 2024-02-01 NOTE — Patient Instructions (Signed)
 Continue 1 tablet daily, except 0.5 tablet every Friday.  INR in 6 weeks  [phone]

## 2024-02-01 NOTE — Telephone Encounter (Signed)
 The patient called in to schedule an appointment.

## 2024-02-02 ENCOUNTER — Ambulatory Visit
Admission: RE | Admit: 2024-02-02 | Discharge: 2024-02-02 | Disposition: A | Discharge status: Home / Self Care | Source: Ambulatory Visit | Attending: Physician Assistant | Admitting: Physician Assistant

## 2024-02-02 ENCOUNTER — Inpatient Hospital Stay: Admission: RE | Admit: 2024-02-02 | Discharge: 2024-02-02 | Attending: Physician Assistant

## 2024-02-02 DIAGNOSIS — R928 Other abnormal and inconclusive findings on diagnostic imaging of breast: Secondary | ICD-10-CM

## 2024-02-03 ENCOUNTER — Encounter: Payer: Self-pay | Admitting: Physician Assistant

## 2024-02-08 ENCOUNTER — Other Ambulatory Visit: Payer: Self-pay | Admitting: Physician Assistant

## 2024-02-08 DIAGNOSIS — R928 Other abnormal and inconclusive findings on diagnostic imaging of breast: Secondary | ICD-10-CM

## 2024-02-09 ENCOUNTER — Other Ambulatory Visit: Payer: Self-pay | Admitting: Medical

## 2024-02-09 ENCOUNTER — Ambulatory Visit
Admission: RE | Admit: 2024-02-09 | Discharge: 2024-02-09 | Disposition: A | Discharge status: Home / Self Care | Source: Ambulatory Visit | Attending: Physician Assistant | Admitting: Physician Assistant

## 2024-02-09 DIAGNOSIS — R928 Other abnormal and inconclusive findings on diagnostic imaging of breast: Secondary | ICD-10-CM | POA: Insufficient documentation

## 2024-02-09 HISTORY — PX: BREAST BIOPSY: SHX20

## 2024-02-09 MED ORDER — LIDOCAINE 1 % OPTIME INJ - NO CHARGE
5.0000 mL | Freq: Once | INTRAMUSCULAR | Status: AC
  Administered 2024-02-09: 5 mL
  Filled 2024-02-09: qty 6

## 2024-02-09 MED ORDER — LIDOCAINE-EPINEPHRINE 1 %-1:100000 IJ SOLN
20.0000 mL | Freq: Once | INTRAMUSCULAR | Status: AC
  Administered 2024-02-09: 20 mL
  Filled 2024-02-09: qty 20

## 2024-02-10 LAB — SURGICAL PATHOLOGY

## 2024-02-16 ENCOUNTER — Other Ambulatory Visit: Payer: Self-pay | Admitting: Internal Medicine

## 2024-02-20 ENCOUNTER — Other Ambulatory Visit: Payer: Self-pay

## 2024-02-21 ENCOUNTER — Encounter: Payer: Self-pay | Admitting: *Deleted

## 2024-02-21 ENCOUNTER — Telehealth: Payer: Self-pay | Admitting: Cardiovascular Disease

## 2024-02-21 ENCOUNTER — Ambulatory Visit

## 2024-02-21 VITALS — BP 136/82 | HR 66 | Temp 98.7°F | Ht 64.0 in | Wt 235.2 lb

## 2024-02-21 DIAGNOSIS — G8929 Other chronic pain: Secondary | ICD-10-CM | POA: Insufficient documentation

## 2024-02-21 DIAGNOSIS — R1013 Epigastric pain: Secondary | ICD-10-CM

## 2024-02-21 NOTE — Addendum Note (Signed)
 Addended by: Justan Gaede, CHAN on: 02/21/2024 12:02 PM   Modules accepted: Orders

## 2024-02-21 NOTE — Telephone Encounter (Addendum)
 Please advise holding Warfarin prior to EGD.  Last INR 02/01/24. In office visit 02/27/24.   Thank you!  AW

## 2024-02-21 NOTE — Addendum Note (Signed)
 Addended by: Preston Weill, CHAN on: 02/21/2024 01:09 PM   Modules accepted: Orders

## 2024-02-21 NOTE — Telephone Encounter (Signed)
"  ° °  Pre-operative Risk Assessment    Patient Name: Leslie [name]  DOB: 1965-10-03 MRN: [id number]   Date of last office visit: 12/22/2023 Date of next office visit: 04/10/2024   Request for Surgical Clearance    Procedure:  EGD  Date of Surgery:  Clearance 03/23/24                                Surgeon:  not indicated Surgeon's Group or Practice Name:   [facility] Phone number:  [phone] Fax number:  [phone]   Type of Clearance Requested:   - Medical    Type of Anesthesia:  General    Additional requests/questions:    SignedTinnie NOVAK Schools   02/21/2024, 11:42 AM   "

## 2024-02-21 NOTE — Progress Notes (Signed)
 "   Primary Care Physician: Kristina Tinnie POUR, PA-C  Primary Gastroenterologist:  Dr. Aloysius JAYSON Nap  Chief Complaint  Patient presents with   Establish Care   Abdominal Pain    On going pain, below the middle of navel   Spasms   Heartburn    HPI: Leslie [name] is a 59 y.o. female here for follow-up.  The patient has a complex medical history including obesity, mitral valve stenosis status post mechanical valve replacement in 2022, COPD, CKD.  The patient was last seen in clinic in 2024.  She presents today for epigastric pain.  Patient states that since August 2025; she had been having epigastric pain.  He comes with the frequency of once to twice a month.  The episodes last about an hour.  Sometimes they are postprandial but other; they have nothing to do with food.  She describes it as feeling bloated.  She describes nausea but no vomiting.  The pain subsides.  She is nearly gone to the ER several times because of this discomfort.  She did have an upper endoscopy for dysphagia in 2023.  She was dilated to 18 mm.  Patient states that she was diagnosed with H. pylori many years ago.  She denies any melena.  Patient is up-to-date on colorectal cancer screening with her last examination 2023.    Past Medical History:  Diagnosis Date   (HFpEF) heart failure with preserved ejection fraction (HCC)    a. 08/2017 Echo: EF 55-60%.  Grade 2 diastolic dysfunction; b. 05/2020 Echo: EF 50-55%, no rwma, Nl RV fxn, nl fxn'ing mech MV.   Allergy    Anemia    Anxiety    Asthma    BRCA negative 03/22/2013   Bronchitis 02/19/2016   ON LEVAQUIN  PO   Chest tightness    a. 08/2019 Cath: nl cors.   Cigarette smoker 09/11/2017   8-10 day   Dysrhythmia    GERD (gastroesophageal reflux disease)    History of kidney stones    Hyperlipidemia    Hypertension    Interstitial lung disease (HCC)    a. CT 2013 b. 02/2018 CXR noted recurrent intersistial changes ILD vs chronic bronchitis   Moderate mitral  stenosis    a.  08/2017 TEE: EF 60 to 65%.  Moderate mitral stenosis.  Mean gradient 14 mmHg.  Valve area 2.59 cm by planimetry, 2.72 cm by pressure half-time.   PAF (paroxysmal atrial fibrillation) (HCC)    a. 08/2017 s/p TEE/DCCV; b. CHA2DS2VASc = 2-->warfarin.   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 04/2020 s/p 27/29 mm Onyx mech mitral valve-->chronic coumadin ; b. 05/2020 Echo: Nl fxn'ing mech MV.    Current Outpatient Medications  Medication Sig Dispense Refill   allopurinol (ZYLOPRIM) 100 MG tablet Take 100 mg by mouth daily.     ALPRAZolam  (XANAX ) 0.5 MG tablet TAKE 1/2 TABLET BY MOUTH AT BEDTIME AS NEEDED FOR ANXIETY 15 tablet 1   amLODipine  (NORVASC ) 5 MG tablet Take 1 tablet (5 mg total) by mouth daily. 90 tablet 3   aspirin  81 MG EC tablet Take 81 mg by mouth daily.     bisacodyl  (DULCOLAX) 5 MG EC tablet ONE TAB AT NIGHT MONDAY WED AND FRIDAY     calcium  carbonate (TUMS -  DOSED IN MG ELEMENTAL CALCIUM ) 500 MG chewable tablet Chew 500 mg by mouth daily as needed for indigestion or heartburn.     dapagliflozin propanediol (FARXIGA) 10 MG TABS tablet Take 10 mg by mouth daily.     ezetimibe  (ZETIA ) 10 MG tablet Take 1 tablet (10 mg total) by mouth daily. 90 tablet 3   famciclovir  (FAMVIR ) 500 MG tablet TAKE ONE TAB BY MOUTH TWICE A DAY FOR INFECTION 180 tablet 1   Fluticasone -Umeclidin-Vilant (TRELEGY ELLIPTA ) 100-62.5-25 MCG/ACT AEPB Inhale 1 puff into the lungs daily. 28 each 0   losartan (COZAAR) 25 MG tablet Take 1 tablet by mouth daily.     metoprolol  succinate (TOPROL -XL) 100 MG 24 hr tablet Take 1 tablet (100 mg total) by mouth daily. May take an additional 50 mg daily as needed for palpitations 90 tablet 3   omeprazole  (PRILOSEC) 40 MG capsule  TAKE 1 CAPSULE (40 MG TOTAL) BY MOUTH DAILY. 90 capsule 0   potassium chloride  SA (KLOR-CON  M) 20 MEQ tablet TAKE 2 TABLETS BY MOUTH TWICE A DAY 360 tablet 2   PREMARIN  vaginal cream SMARTSIG:0.5 Applicator Vaginal Once a Week     Semaglutide ,0.25 or 0.5MG /DOS, (OZEMPIC , 0.25 OR 0.5 MG/DOSE,) 2 MG/3ML SOPN INJECT 0.5MG  ONCE A WEEK 3 mL 1   spironolactone  (ALDACTONE ) 25 MG tablet Take 0.5 tablets (12.5 mg total) by mouth daily. 45 tablet 3   torsemide  (DEMADEX ) 10 MG tablet TAKE 1 TABLET BY MOUTH TWICE A DAY 180 tablet 3   warfarin (COUMADIN ) 2 MG tablet TAKE 1-2 TABLETS DAILY OR AS PRESCRIBED BY COUMADIN  CLINIC 180 tablet 0   No current facility-administered medications for this visit.    Allergies as of 02/21/2024 - Review Complete 02/21/2024  Allergen Reaction Noted   Morphine Hives and Rash 06/06/2012   Oxycodone  hcl Hives and Itching 12/26/2014   Dilaudid  [hydromorphone  hcl] Itching 12/26/2014    ROS:  General: Negative for anorexia, weight loss, fever, chills, fatigue, weakness. ENT: Negative for hoarseness, difficulty swallowing , nasal congestion. CV: Negative for chest pain, angina, palpitations, dyspnea on exertion, peripheral edema.  Respiratory: Negative for dyspnea at rest, dyspnea on exertion, cough, sputum, wheezing.  GI: See history of present illness. GU:  Negative for dysuria, hematuria, urinary incontinence, urinary frequency, nocturnal urination.  Endo: Negative for unusual weight change.    Physical Examination:   BP 136/82   Pulse 66   Temp 98.7 F (37.1 C) (Oral)   Ht 5' 4 (1.626 m)   Wt 235 lb 4 oz (106.7 kg)   SpO2 99%   BMI 40.38 kg/m   General: Well-nourished, well-developed in no acute distress.  Eyes: No icterus. Conjunctivae pink. Lungs: Clear to auscultation bilaterally. Non-labored. Heart: Regular rate and rhythm, no murmurs rubs or gallops.  Abdomen: Bowel sounds are normal, nontender, nondistended, no hepatosplenomegaly or masses, no  abdominal bruits or hernia , no rebound or guarding.   Extremities: No lower extremity edema. No clubbing or deformities. Neuro: Alert and oriented x 3.  Grossly intact. Skin: Warm and dry, no jaundice.   Psych: Alert and cooperative, normal mood and affect.     Assessment and Plan:   Leslie [name] is a 59 y.o. y/o female with a history of mechanical valve replacement, COPD, chronic kidney failure, status postcholecystectomy who presents for chronic epigastric pain.  She had been diagnosed with H. pylori 20 years ago.  Epigastric pain occurs in episodes.  Plan. 1.  EGD will be offered.  I asked the  patient to stop her PPI 1 week prior to the procedure.  This is to definitively rule out H. pylori.  She does have significant reflux and she can take Tums the week prior to her procedure for relief. 2.  Note will be sent to our colleagues in cardiology for Lovenox  bridging for this patient. 3.  Further recommendations after EGD.  Biopsies will be taken via [facility] protocol.     Aloysius JAYSON Nap, MD. NOLIA    Note: This dictation was prepared with Dragon dictation along with smaller phrase technology. Any transcriptional errors that result from this process are unintentional.  "

## 2024-02-21 NOTE — Telephone Encounter (Signed)
 I s/w the pt and she agrees to move her appt up sooner as she now needs preop clearance. Pt appt has been moved up to 02/27/24 with Tylene Lunch, NP for planned f/u as well as preop clearance.   I did also let the preop APP know the pt is on ASA and Warfarin. Though ASA not generally held for this procedure, though will need recommendations from pharm-d for Warfarin. Preop APP Mardy Pizza, FNP stated she will send to pharm-d.    Pt thanked me for the help

## 2024-02-24 NOTE — Telephone Encounter (Signed)
 Patient with diagnosis of atrial fibrillation and mechanical mitral valve on warfarin for anticoagulation.    Procedure:  EGD   Date of Surgery:  Clearance 03/23/24      CHA2DS2-VASc Score = 4   This indicates a 4.8% annual risk of stroke. The patient's score is based upon: CHF History: 1 HTN History: 1 Diabetes History: 0 Stroke History: 0 Vascular Disease History: 1 Age Score: 0 Gender Score: 1    CrCl 46 Platelet count 375  Patient has not had an Afib/aflutter ablation in the last 3 months, DCCV within the last 4 weeks or a watchman implanted in the last 45 days    Per office protocol, patient can hold warfarin for 5 days prior to procedure.   Patient WILL need bridging with Lovenox  (enoxaparin ) around procedure.  Coumadin  clinic - [place] office - has been notified, they will arrange bridge at 03/14/24 INR appointment.  **This guidance is not considered finalized until pre-operative APP has relayed final recommendations.**

## 2024-02-27 ENCOUNTER — Ambulatory Visit: Admitting: Cardiology

## 2024-03-14 ENCOUNTER — Ambulatory Visit

## 2024-03-23 ENCOUNTER — Ambulatory Visit: Admit: 2024-03-23

## 2024-04-05 ENCOUNTER — Ambulatory Visit: Admitting: Physician Assistant

## 2024-04-10 ENCOUNTER — Ambulatory Visit: Payer: Self-pay | Admitting: Cardiology

## 2024-04-24 ENCOUNTER — Ambulatory Visit

## 2024-12-10 ENCOUNTER — Ambulatory Visit: Admitting: Physician Assistant
